# Patient Record
Sex: Female | Born: 1980 | Race: White | Hispanic: No | Marital: Single | Smoking: Former smoker
Health system: Southern US, Community
[De-identification: ages and names within clinical notes are randomized; demographics above are authoritative.]

## PROBLEM LIST (undated history)

## (undated) ENCOUNTER — Emergency Department (HOSPITAL_COMMUNITY): Admission: EM | Payer: Medicare Other | Source: Home / Self Care

## (undated) ENCOUNTER — Inpatient Hospital Stay (HOSPITAL_COMMUNITY): Payer: Self-pay

## (undated) DIAGNOSIS — G709 Myoneural disorder, unspecified: Secondary | ICD-10-CM

## (undated) DIAGNOSIS — J189 Pneumonia, unspecified organism: Secondary | ICD-10-CM

## (undated) DIAGNOSIS — R7689 Other specified abnormal immunological findings in serum: Secondary | ICD-10-CM

## (undated) DIAGNOSIS — R3915 Urgency of urination: Secondary | ICD-10-CM

## (undated) DIAGNOSIS — F410 Panic disorder [episodic paroxysmal anxiety] without agoraphobia: Secondary | ICD-10-CM

## (undated) DIAGNOSIS — G629 Polyneuropathy, unspecified: Secondary | ICD-10-CM

## (undated) DIAGNOSIS — Z973 Presence of spectacles and contact lenses: Secondary | ICD-10-CM

## (undated) DIAGNOSIS — F4310 Post-traumatic stress disorder, unspecified: Secondary | ICD-10-CM

## (undated) DIAGNOSIS — G35 Multiple sclerosis: Secondary | ICD-10-CM

## (undated) DIAGNOSIS — K644 Residual hemorrhoidal skin tags: Secondary | ICD-10-CM

## (undated) DIAGNOSIS — G8929 Other chronic pain: Secondary | ICD-10-CM

## (undated) DIAGNOSIS — K631 Perforation of intestine (nontraumatic): Secondary | ICD-10-CM

## (undated) DIAGNOSIS — K760 Fatty (change of) liver, not elsewhere classified: Secondary | ICD-10-CM

## (undated) DIAGNOSIS — Z87442 Personal history of urinary calculi: Secondary | ICD-10-CM

## (undated) DIAGNOSIS — K219 Gastro-esophageal reflux disease without esophagitis: Secondary | ICD-10-CM

## (undated) DIAGNOSIS — J302 Other seasonal allergic rhinitis: Secondary | ICD-10-CM

## (undated) DIAGNOSIS — F32A Depression, unspecified: Secondary | ICD-10-CM

## (undated) DIAGNOSIS — G43909 Migraine, unspecified, not intractable, without status migrainosus: Secondary | ICD-10-CM

## (undated) DIAGNOSIS — M1990 Unspecified osteoarthritis, unspecified site: Secondary | ICD-10-CM

## (undated) DIAGNOSIS — N83209 Unspecified ovarian cyst, unspecified side: Secondary | ICD-10-CM

## (undated) DIAGNOSIS — R42 Dizziness and giddiness: Secondary | ICD-10-CM

## (undated) DIAGNOSIS — F445 Conversion disorder with seizures or convulsions: Secondary | ICD-10-CM

## (undated) DIAGNOSIS — M5030 Other cervical disc degeneration, unspecified cervical region: Secondary | ICD-10-CM

## (undated) DIAGNOSIS — R768 Other specified abnormal immunological findings in serum: Secondary | ICD-10-CM

## (undated) DIAGNOSIS — Z98891 History of uterine scar from previous surgery: Secondary | ICD-10-CM

## (undated) DIAGNOSIS — F449 Dissociative and conversion disorder, unspecified: Secondary | ICD-10-CM

## (undated) DIAGNOSIS — Z8709 Personal history of other diseases of the respiratory system: Secondary | ICD-10-CM

## (undated) DIAGNOSIS — F329 Major depressive disorder, single episode, unspecified: Secondary | ICD-10-CM

## (undated) DIAGNOSIS — R569 Unspecified convulsions: Secondary | ICD-10-CM

## (undated) DIAGNOSIS — F419 Anxiety disorder, unspecified: Secondary | ICD-10-CM

## (undated) DIAGNOSIS — M797 Fibromyalgia: Secondary | ICD-10-CM

## (undated) DIAGNOSIS — M549 Dorsalgia, unspecified: Secondary | ICD-10-CM

## (undated) DIAGNOSIS — I10 Essential (primary) hypertension: Secondary | ICD-10-CM

## (undated) DIAGNOSIS — F909 Attention-deficit hyperactivity disorder, unspecified type: Secondary | ICD-10-CM

## (undated) DIAGNOSIS — R51 Headache: Secondary | ICD-10-CM

## (undated) HISTORY — PX: TRANSRECTAL DRAINAGE OF PELVIC ABSCESS: SUR1387

## (undated) HISTORY — PX: ABDOMINAL SURGERY: SHX537

## (undated) HISTORY — PX: EXCISIONAL HEMORRHOIDECTOMY: SHX1541

## (undated) HISTORY — PX: HERNIA REPAIR: SHX51

## (undated) HISTORY — PX: COLON SURGERY: SHX602

## (undated) HISTORY — PX: APPENDECTOMY: SHX54

## (undated) HISTORY — DX: Essential (primary) hypertension: I10

---
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
## 2001-07-07 ENCOUNTER — Other Ambulatory Visit: Admission: RE | Admit: 2001-07-07 | Discharge: 2001-07-07 | Payer: Self-pay

## 2006-06-08 DIAGNOSIS — G35 Multiple sclerosis: Secondary | ICD-10-CM

## 2006-06-08 HISTORY — DX: Multiple sclerosis: G35

## 2006-09-03 DIAGNOSIS — M792 Neuralgia and neuritis, unspecified: Secondary | ICD-10-CM | POA: Insufficient documentation

## 2006-09-03 DIAGNOSIS — N200 Calculus of kidney: Secondary | ICD-10-CM | POA: Insufficient documentation

## 2007-01-08 DIAGNOSIS — K631 Perforation of intestine (nontraumatic): Secondary | ICD-10-CM

## 2007-01-08 DIAGNOSIS — Z9049 Acquired absence of other specified parts of digestive tract: Secondary | ICD-10-CM

## 2007-01-08 HISTORY — PX: BOWEL RESECTION: SHX1257

## 2007-01-08 HISTORY — DX: Perforation of intestine (nontraumatic): K63.1

## 2007-01-08 HISTORY — DX: Acquired absence of other specified parts of digestive tract: Z90.49

## 2007-04-08 HISTORY — PX: COLOSTOMY CLOSURE: SHX1381

## 2007-12-27 ENCOUNTER — Emergency Department (HOSPITAL_COMMUNITY): Admission: EM | Admit: 2007-12-27 | Discharge: 2007-12-27 | Payer: Self-pay | Admitting: Emergency Medicine

## 2009-12-11 ENCOUNTER — Ambulatory Visit: Payer: Self-pay | Admitting: Internal Medicine

## 2010-01-10 ENCOUNTER — Ambulatory Visit: Admit: 2010-01-10 | Payer: Self-pay | Admitting: Internal Medicine

## 2010-01-10 ENCOUNTER — Ambulatory Visit (HOSPITAL_COMMUNITY): Admission: RE | Admit: 2010-01-10 | Payer: Self-pay | Source: Home / Self Care | Admitting: Internal Medicine

## 2010-02-22 ENCOUNTER — Encounter (INDEPENDENT_AMBULATORY_CARE_PROVIDER_SITE_OTHER): Payer: Self-pay | Admitting: Internal Medicine

## 2010-02-22 ENCOUNTER — Ambulatory Visit (HOSPITAL_COMMUNITY): Admission: RE | Admit: 2010-02-22 | Payer: Medicaid Other | Source: Ambulatory Visit | Admitting: Internal Medicine

## 2010-10-12 LAB — BASIC METABOLIC PANEL
BUN: 14 mg/dL (ref 6–23)
Chloride: 105 mEq/L (ref 96–112)
Creatinine, Ser: 0.67 mg/dL (ref 0.4–1.2)
Glucose, Bld: 94 mg/dL (ref 70–99)

## 2010-10-25 DIAGNOSIS — K219 Gastro-esophageal reflux disease without esophagitis: Secondary | ICD-10-CM | POA: Insufficient documentation

## 2010-10-25 DIAGNOSIS — J039 Acute tonsillitis, unspecified: Secondary | ICD-10-CM | POA: Insufficient documentation

## 2011-01-15 ENCOUNTER — Encounter (HOSPITAL_BASED_OUTPATIENT_CLINIC_OR_DEPARTMENT_OTHER): Payer: Self-pay | Admitting: *Deleted

## 2011-01-16 ENCOUNTER — Other Ambulatory Visit: Payer: Self-pay | Admitting: Specialist

## 2011-01-16 NOTE — H&P (Signed)
Emily [name] is an 31 y.o. female.   Chief Complaint: Surgery includes exploration of scar of abdomen and repair of defects HPI:   Past Medical History  Diagnosis Date  . Headache     migraines  . Multiple sclerosis   . Asthma as a child  . GERD (gastroesophageal reflux disease)   . Urinary urgency   . Anxiety   . Perforated bowel 2009    Past Surgical History  Procedure Date  . Extremity cyst excision 1994    right leg  . Bowel resection 01/2007    with colostomy  . Colostomy closure 04/2007    No family history on file. Social History:  reports that she has quit smoking. She has never used smokeless tobacco. She reports that she does not drink alcohol or use illicit drugs.  Allergies:  Allergies  Allergen Reactions  . Amitriptyline Hypertension  . Baclofen Hives and Shortness Of Breath  . Cymbalta (Duloxetine Hcl) Shortness Of Breath and Rash  . Gabapentin Shortness Of Breath and Rash  . Other Shortness Of Breath and Rash    MSG  . Xanax Other (See Comments)    Lethargy  . Adhesive (Tape) Other (See Comments)    Skin irritation    Medications Prior to Admission  Medication Sig Dispense Refill  . carbamazepine (TEGRETOL) 200 MG tablet Take 300 mg by mouth 3 (three) times daily.        . carisoprodol (SOMA) 350 MG tablet Take 350 mg by mouth 3 (three) times daily as needed.        . diphenhydrAMINE (SOMINEX) 25 MG tablet Take 25 mg by mouth at bedtime as needed. 50-75 mg HS       . EPINEPHrine (EPIPEN JR) 0.15 MG/0.3ML injection Inject 0.15 mg into the muscle as needed.        . loratadine (CLARITIN) 10 MG tablet Take 10 mg by mouth daily.        . metoprolol (TOPROL-XL) 50 MG 24 hr tablet Take 50 mg by mouth daily. PM       . omeprazole (PRILOSEC) 40 MG capsule Take 40 mg by mouth 2 (two) times daily.        . ondansetron (ZOFRAN) 4 MG tablet Take 4 mg by mouth every 8 (eight) hours as needed.        Marland Kitchen oxyCODONE (ROXICODONE) 15 MG immediate release tablet Take  15 mg by mouth every 4 (four) hours as needed.        Marland Kitchen PARoxetine (PAXIL) 40 MG tablet Take 40 mg by mouth every morning.        . pregabalin (LYRICA) 200 MG capsule Take 200 mg by mouth 3 (three) times daily.        . ranitidine (ZANTAC) 300 MG capsule Take 300 mg by mouth every evening.        . solifenacin (VESICARE) 5 MG tablet Take 10 mg by mouth daily. AM       . topiramate (TOPAMAX) 100 MG tablet Take 100 mg by mouth 2 (two) times daily. 2 AM, 1 PM        No current facility-administered medications on file as of 01/16/2011.    No results found for this or any previous visit (from the past 48 hour(s)). No results found.  ROS  There were no vitals taken for this visit. Physical Exam   Assessment/Plan   Emily [name] 01/16/2011, 1:24 PM

## 2011-01-17 NOTE — Pre-Procedure Instructions (Signed)
To come for BMET and CBC, diff - boyfriend notified; will come 01/17/2010

## 2011-01-18 ENCOUNTER — Encounter (HOSPITAL_BASED_OUTPATIENT_CLINIC_OR_DEPARTMENT_OTHER)
Admission: RE | Admit: 2011-01-18 | Discharge: 2011-01-18 | Disposition: A | Discharge status: Home / Self Care | Payer: Medicare Other | Source: Ambulatory Visit | Attending: Specialist | Admitting: Specialist

## 2011-01-18 LAB — CBC
Hemoglobin: 14.2 g/dL (ref 12.0–15.0)
Platelets: 269 10*3/uL (ref 150–400)
RBC: 4.57 MIL/uL (ref 3.87–5.11)
WBC: 12 10*3/uL — ABNORMAL HIGH (ref 4.0–10.5)

## 2011-01-21 ENCOUNTER — Encounter (HOSPITAL_BASED_OUTPATIENT_CLINIC_OR_DEPARTMENT_OTHER): Payer: Self-pay | Admitting: Anesthesiology

## 2011-01-21 ENCOUNTER — Encounter (HOSPITAL_BASED_OUTPATIENT_CLINIC_OR_DEPARTMENT_OTHER): Payer: Self-pay

## 2011-01-21 ENCOUNTER — Encounter (HOSPITAL_BASED_OUTPATIENT_CLINIC_OR_DEPARTMENT_OTHER): Payer: Self-pay | Admitting: Specialist

## 2011-01-21 ENCOUNTER — Ambulatory Visit (HOSPITAL_BASED_OUTPATIENT_CLINIC_OR_DEPARTMENT_OTHER)
Admission: RE | Admit: 2011-01-21 | Discharge: 2011-01-21 | Disposition: A | Discharge status: Home / Self Care | Payer: Medicare Other | Source: Ambulatory Visit | Attending: Specialist | Admitting: Specialist

## 2011-01-21 ENCOUNTER — Encounter (HOSPITAL_BASED_OUTPATIENT_CLINIC_OR_DEPARTMENT_OTHER)
Admission: RE | Disposition: A | Discharge status: Home / Self Care | Payer: Self-pay | Source: Ambulatory Visit | Attending: Specialist

## 2011-01-21 ENCOUNTER — Other Ambulatory Visit: Payer: Self-pay | Admitting: Specialist

## 2011-01-21 ENCOUNTER — Ambulatory Visit (HOSPITAL_BASED_OUTPATIENT_CLINIC_OR_DEPARTMENT_OTHER): Payer: Medicare Other | Admitting: Anesthesiology

## 2011-01-21 DIAGNOSIS — Y834 Other reconstructive surgery as the cause of abnormal reaction of the patient, or of later complication, without mention of misadventure at the time of the procedure: Secondary | ICD-10-CM | POA: Insufficient documentation

## 2011-01-21 DIAGNOSIS — Z01812 Encounter for preprocedural laboratory examination: Secondary | ICD-10-CM | POA: Insufficient documentation

## 2011-01-21 DIAGNOSIS — K439 Ventral hernia without obstruction or gangrene: Secondary | ICD-10-CM | POA: Insufficient documentation

## 2011-01-21 DIAGNOSIS — L905 Scar conditions and fibrosis of skin: Secondary | ICD-10-CM | POA: Insufficient documentation

## 2011-01-21 DIAGNOSIS — T8189XA Other complications of procedures, not elsewhere classified, initial encounter: Secondary | ICD-10-CM | POA: Insufficient documentation

## 2011-01-21 HISTORY — DX: Multiple sclerosis: G35

## 2011-01-21 HISTORY — PX: SCAR REVISION: SHX5285

## 2011-01-21 HISTORY — DX: Anxiety disorder, unspecified: F41.9

## 2011-01-21 HISTORY — DX: Headache: R51

## 2011-01-21 HISTORY — DX: Gastro-esophageal reflux disease without esophagitis: K21.9

## 2011-01-21 HISTORY — DX: Urgency of urination: R39.15

## 2011-01-21 HISTORY — DX: Perforation of intestine (nontraumatic): K63.1

## 2011-01-21 LAB — POCT I-STAT, CHEM 8
Calcium, Ion: 0.85 mmol/L — ABNORMAL LOW (ref 1.12–1.32)
Chloride: 117 mEq/L — ABNORMAL HIGH (ref 96–112)
Creatinine, Ser: 0.6 mg/dL (ref 0.50–1.10)
Glucose, Bld: 82 mg/dL (ref 70–99)
HCT: 37 % (ref 36.0–46.0)
Potassium: 5.3 mEq/L — ABNORMAL HIGH (ref 3.5–5.1)

## 2011-01-21 SURGERY — REVISION, SCAR
Anesthesia: General | Site: Abdomen | Wound class: Clean

## 2011-01-21 MED ORDER — ROCURONIUM BROMIDE 100 MG/10ML IV SOLN
INTRAVENOUS | Status: DC | PRN
  Administered 2011-01-21: 30 mg via INTRAVENOUS

## 2011-01-21 MED ORDER — CEFAZOLIN SODIUM 1-5 GM-% IV SOLN
1.0000 g | INTRAVENOUS | Status: AC
  Administered 2011-01-21: 2 g via INTRAVENOUS

## 2011-01-21 MED ORDER — DROPERIDOL 2.5 MG/ML IJ SOLN
INTRAMUSCULAR | Status: DC | PRN
  Administered 2011-01-21: 0.625 mg via INTRAVENOUS

## 2011-01-21 MED ORDER — SODIUM CHLORIDE 0.9 % IV SOLN
INTRAVENOUS | Status: DC | PRN
  Administered 2011-01-21: 100 mL via INTRAMUSCULAR

## 2011-01-21 MED ORDER — LIDOCAINE-EPINEPHRINE 0.5-1:200000 % IJ SOLN
INTRAMUSCULAR | Status: DC | PRN
  Administered 2011-01-21: 100 mL

## 2011-01-21 MED ORDER — LIDOCAINE HCL (CARDIAC) 20 MG/ML IV SOLN
INTRAVENOUS | Status: DC | PRN
  Administered 2011-01-21: 60 mg via INTRAVENOUS

## 2011-01-21 MED ORDER — NEOSTIGMINE METHYLSULFATE 1 MG/ML IJ SOLN
INTRAMUSCULAR | Status: DC | PRN
  Administered 2011-01-21: 3 mg via INTRAVENOUS

## 2011-01-21 MED ORDER — MIDAZOLAM HCL 5 MG/5ML IJ SOLN
INTRAMUSCULAR | Status: DC | PRN
  Administered 2011-01-21: 2 mg via INTRAVENOUS

## 2011-01-21 MED ORDER — DEXAMETHASONE SODIUM PHOSPHATE 4 MG/ML IJ SOLN
INTRAMUSCULAR | Status: DC | PRN
  Administered 2011-01-21: 10 mg via INTRAVENOUS

## 2011-01-21 MED ORDER — LACTATED RINGERS IV SOLN
INTRAVENOUS | Status: DC
  Administered 2011-01-21: 07:00:00 via INTRAVENOUS

## 2011-01-21 MED ORDER — METHYLENE BLUE 1 % INJ SOLN
INTRAMUSCULAR | Status: DC | PRN
  Administered 2011-01-21: .5 mL via INTRADERMAL

## 2011-01-21 MED ORDER — FENTANYL CITRATE 0.05 MG/ML IJ SOLN
INTRAMUSCULAR | Status: DC | PRN
  Administered 2011-01-21: 100 ug via INTRAVENOUS

## 2011-01-21 MED ORDER — PROPOFOL 10 MG/ML IV EMUL
INTRAVENOUS | Status: DC | PRN
  Administered 2011-01-21: 300 mg via INTRAVENOUS

## 2011-01-21 MED ORDER — GLYCOPYRROLATE 0.2 MG/ML IJ SOLN
INTRAMUSCULAR | Status: DC | PRN
  Administered 2011-01-21: .4 mg via INTRAVENOUS

## 2011-01-21 MED ORDER — ONDANSETRON HCL 4 MG/2ML IJ SOLN
INTRAMUSCULAR | Status: DC | PRN
  Administered 2011-01-21: 4 mg via INTRAVENOUS

## 2011-01-21 SURGICAL SUPPLY — 72 items
APL SKNCLS STERI-STRIP NONHPOA (GAUZE/BANDAGES/DRESSINGS) ×1
BAG DECANTER FOR FLEXI CONT (MISCELLANEOUS) IMPLANT
BANDAGE ELASTIC 4 VELCRO ST LF (GAUZE/BANDAGES/DRESSINGS) IMPLANT
BANDAGE GAUZE ELAST BULKY 4 IN (GAUZE/BANDAGES/DRESSINGS) IMPLANT
BENZOIN TINCTURE PRP APPL 2/3 (GAUZE/BANDAGES/DRESSINGS) ×1 IMPLANT
BLADE KNIFE PERSONA 15 (BLADE) ×2 IMPLANT
BNDG COHESIVE 4X5 TAN STRL (GAUZE/BANDAGES/DRESSINGS) IMPLANT
CANISTER SUCTION 1200CC (MISCELLANEOUS) IMPLANT
CLOTH BEACON ORANGE TIMEOUT ST (SAFETY) ×2 IMPLANT
COVER MAYO STAND STRL (DRAPES) ×2 IMPLANT
COVER TABLE BACK 60X90 (DRAPES) ×2 IMPLANT
DECANTER SPIKE VIAL GLASS SM (MISCELLANEOUS) IMPLANT
DRAIN CHANNEL 7F FF FLAT (WOUND CARE) ×1 IMPLANT
DRAPE LAPAROTOMY TRNSV 102X78 (DRAPE) ×1 IMPLANT
DRAPE PED LAPAROTOMY (DRAPES) IMPLANT
DRAPE U-SHAPE 76X120 STRL (DRAPES) IMPLANT
DRSG PAD ABDOMINAL 8X10 ST (GAUZE/BANDAGES/DRESSINGS) IMPLANT
ELECT NDL TIP 2.8 STRL (NEEDLE) IMPLANT
ELECT NEEDLE TIP 2.8 STRL (NEEDLE) IMPLANT
ELECT REM PT RETURN 9FT ADLT (ELECTROSURGICAL) ×2
ELECTRODE REM PT RTRN 9FT ADLT (ELECTROSURGICAL) ×1 IMPLANT
EVACUATOR SILICONE 100CC (DRAIN) ×1 IMPLANT
FILTER 7/8 IN (FILTER) IMPLANT
GAUZE SPONGE 4X4 12PLY STRL LF (GAUZE/BANDAGES/DRESSINGS) ×1 IMPLANT
GAUZE SPONGE 4X4 16PLY XRAY LF (GAUZE/BANDAGES/DRESSINGS) IMPLANT
GAUZE XEROFORM 1X8 LF (GAUZE/BANDAGES/DRESSINGS) IMPLANT
GAUZE XEROFORM 5X9 LF (GAUZE/BANDAGES/DRESSINGS) ×1 IMPLANT
GLOVE BIO SURGEON STRL SZ 6.5 (GLOVE) ×2 IMPLANT
GLOVE BIOGEL M STRL SZ7.5 (GLOVE) IMPLANT
GLOVE BIOGEL PI IND STRL 8 (GLOVE) IMPLANT
GLOVE BIOGEL PI INDICATOR 8 (GLOVE)
GLOVE ECLIPSE 7.0 STRL STRAW (GLOVE) ×2 IMPLANT
GOWN PREVENTION PLUS XLARGE (GOWN DISPOSABLE) IMPLANT
GOWN PREVENTION PLUS XXLARGE (GOWN DISPOSABLE) ×3 IMPLANT
IV NS 500ML (IV SOLUTION) ×2
IV NS 500ML BAXH (IV SOLUTION) ×1 IMPLANT
NDL HYPO 25X1 1.5 SAFETY (NEEDLE) IMPLANT
NDL SPNL 18GX3.5 QUINCKE PK (NEEDLE) IMPLANT
NEEDLE HYPO 25X1 1.5 SAFETY (NEEDLE) IMPLANT
NEEDLE SPNL 18GX3.5 QUINCKE PK (NEEDLE) IMPLANT
PACK BASIN DAY SURGERY FS (CUSTOM PROCEDURE TRAY) ×2 IMPLANT
PAD ABD 7.5X8 STRL (GAUZE/BANDAGES/DRESSINGS) ×1 IMPLANT
SHEET MEDIUM DRAPE 40X70 STRL (DRAPES) IMPLANT
SHEETING SILICONE GEL EPI DERM (MISCELLANEOUS) IMPLANT
SPONGE GAUZE 4X4 12PLY (GAUZE/BANDAGES/DRESSINGS) IMPLANT
SPONGE LAP 18X18 X RAY DECT (DISPOSABLE) ×2 IMPLANT
STOCKINETTE IMPERVIOUS LG (DRAPES) IMPLANT
STRIP SUTURE WOUND CLOSURE 1/2 (SUTURE) ×1 IMPLANT
SUT FIBERWIRE 3-0 18 DIAM 3/8 (SUTURE)
SUT MNCRL AB 3-0 PS2 18 (SUTURE) ×1 IMPLANT
SUT MON AB 2-0 CT1 36 (SUTURE) ×1 IMPLANT
SUT MON AB 4-0 PC3 18 (SUTURE) IMPLANT
SUT MON AB 5-0 PS2 18 (SUTURE) IMPLANT
SUT PDS AB 0 CT 36 (SUTURE) ×1 IMPLANT
SUT PROLENE 0 CT 1 30 (SUTURE) ×1 IMPLANT
SUT PROLENE 4 0 PS 2 18 (SUTURE) ×1 IMPLANT
SUT PROLENE 5 0 P 3 (SUTURE) IMPLANT
SUT PROLENE 5 0 PS 2 (SUTURE) IMPLANT
SUT PROLENE 6 0 P 1 18 (SUTURE) IMPLANT
SUT STRIPS 1/4 X 4 INCH (SUTURE) IMPLANT
SUTURE FIBERWR 3-0 18 DIAM 3/8 (SUTURE) IMPLANT
SYR 20CC LL (SYRINGE) IMPLANT
SYR 50ML LL SCALE MARK (SYRINGE) ×2 IMPLANT
SYR BULB IRRIGATION 50ML (SYRINGE) IMPLANT
SYR CONTROL 10ML LL (SYRINGE) ×1 IMPLANT
TAPE HYPAFIX 6X30 (GAUZE/BANDAGES/DRESSINGS) ×1 IMPLANT
TAPE PAPER MEDFIX 1IN X 10YD (GAUZE/BANDAGES/DRESSINGS) IMPLANT
TOWEL OR 17X24 6PK STRL BLUE (TOWEL DISPOSABLE) ×5 IMPLANT
TUBE CONNECTING 20X1/4 (TUBING) IMPLANT
UNDERPAD 30X30 INCONTINENT (UNDERPADS AND DIAPERS) ×3 IMPLANT
VAC PENCILS W/TUBING CLEAR (MISCELLANEOUS) ×2 IMPLANT
WATER STERILE IRR 1000ML POUR (IV SOLUTION) ×2 IMPLANT

## 2011-01-21 NOTE — Anesthesia Procedure Notes (Signed)
Procedure Name: Intubation Performed by: Sharyne Richters Pre-anesthesia Checklist: Patient identified, Timeout performed, Emergency Drugs available, Suction available and Patient being monitored Patient Re-evaluated:Patient Re-evaluated prior to inductionOxygen Delivery Method: Circle System Utilized Preoxygenation: Pre-oxygenation with 100% oxygen Intubation Type: IV induction Ventilation: Mask ventilation without difficulty Laryngoscope Size: Miller and 2 Grade View: Grade I Tube type: Oral Tube size: 7.0 mm Number of attempts: 1 Placement Confirmation: ETT inserted through vocal cords under direct vision,  positive ETCO2 and breath sounds checked- equal and bilateral Secured at: 21 cm Tube secured with: Tape Dental Injury: Teeth and Oropharynx as per pre-operative assessment

## 2011-01-21 NOTE — Anesthesia Postprocedure Evaluation (Signed)
Anesthesia Post Note  Patient: Emily [name]  Procedure(s) Performed:  SCAR REVISION - exploration of scar of abdomen and repair of defect  Anesthesia type: General  Patient location: PACU  Post pain: Pain level controlled  Post assessment: Patient's Cardiovascular Status Stable  Last Vitals:  Filed Vitals:   01/21/11 1032  BP: 124/71  Pulse: 77  Temp: 36.9 C  Resp: 16    Post vital signs: Reviewed and stable  Level of consciousness: alert  Complications: No apparent anesthesia complications

## 2011-01-21 NOTE — Transfer of Care (Signed)
Immediate Anesthesia Transfer of Care Note  Patient: Emily [name]  Procedure(s) Performed:  SCAR REVISION - exploration of scar of abdomen and repair of defect  Patient Location: PACU  Anesthesia Type: General  Level of Consciousness: awake, alert  and oriented  Airway & Oxygen Therapy: Patient Spontanous Breathing and Patient connected to face mask oxygen  Post-op Assessment: Report given to PACU RN and Post -op Vital signs reviewed and stable  Post vital signs: Reviewed and stable Filed Vitals:   01/21/11 1000  BP:   Pulse:   Temp: 37.2 C  Resp:     Complications: No apparent anesthesia complications

## 2011-01-21 NOTE — Anesthesia Preprocedure Evaluation (Signed)
Anesthesia Evaluation  Patient identified by MRN, date of birth, ID band Patient awake    Reviewed: Allergy & Precautions, H&P , NPO status , Patient's Chart, lab work & pertinent test results, reviewed documented beta blocker date and time   Airway Mallampati: II TM Distance: >3 FB Neck ROM: full    Dental   Pulmonary asthma ,          Cardiovascular neg cardio ROS     Neuro/Psych  Headaches,  Neuromuscular disease Negative Psych ROS   GI/Hepatic Neg liver ROS, GERD-  Medicated and Controlled,  Endo/Other  Negative Endocrine ROSMorbid obesity  Renal/GU negative Renal ROS  Genitourinary negative   Musculoskeletal   Abdominal   Peds  Hematology negative hematology ROS (+)   Anesthesia Other Findings See surgeon's H&P   Reproductive/Obstetrics negative OB ROS                           Anesthesia Physical Anesthesia Plan  ASA: III  Anesthesia Plan: General   Post-op Pain Management:    Induction: Intravenous  Airway Management Planned: LMA  Additional Equipment:   Intra-op Plan:   Post-operative Plan: Extubation in OR  Informed Consent: I have reviewed the patients History and Physical, chart, labs and discussed the procedure including the risks, benefits and alternatives for the proposed anesthesia with the patient or authorized representative who has indicated his/her understanding and acceptance.     Plan Discussed with: CRNA and Surgeon  Anesthesia Plan Comments:         Anesthesia Quick Evaluation

## 2011-01-21 NOTE — H&P (Signed)
Emily [name] is an 31 y.o. female.   Chief Complaint: abdomen, wound exploration and repair of defect HPI:   Past Medical History  Diagnosis Date  . Headache     migraines  . Multiple sclerosis   . Asthma as a child  . GERD (gastroesophageal reflux disease)   . Urinary urgency   . Anxiety   . Perforated bowel 2009    Past Surgical History  Procedure Date  . Extremity cyst excision 1994    right leg  . Bowel resection 01/2007    with colostomy  . Colostomy closure 04/2007    No family history on file. Social History:  reports that she has quit smoking. She has never used smokeless tobacco. She reports that she does not drink alcohol or use illicit drugs.  Allergies:  Allergies  Allergen Reactions  . Amitriptyline Hypertension  . Baclofen Hives and Shortness Of Breath  . Cymbalta (Duloxetine Hcl) Shortness Of Breath and Rash  . Gabapentin Shortness Of Breath and Rash  . Other Shortness Of Breath and Rash    MSG  . Xanax Other (See Comments)    Lethargy  . Vicodin (Hydrocodone-Acetaminophen) Nausea And Vomiting  . Adhesive (Tape) Other (See Comments)    Skin irritation    Medications Prior to Admission  Medication Dose Route Frequency Provider Last Rate Last Dose  . ceFAZolin (ANCEF) IVPB 1 g/50 mL premix  1 g Intravenous 60 min Pre-Op Yaakov Guthrie Keyonta Barradas      . lactated ringers infusion   Intravenous Continuous Kerby Nora, MD 20 mL/hr at 01/21/11 0706     Medications Prior to Admission  Medication Sig Dispense Refill  . carbamazepine (TEGRETOL) 200 MG tablet Take 300 mg by mouth 3 (three) times daily.        . carisoprodol (SOMA) 350 MG tablet Take 350 mg by mouth 3 (three) times daily as needed.        . diphenhydrAMINE (SOMINEX) 25 MG tablet Take 25 mg by mouth at bedtime as needed. 50-75 mg HS       . EPINEPHrine (EPIPEN JR) 0.15 MG/0.3ML injection Inject 0.15 mg into the muscle as needed.        . loratadine (CLARITIN) 10 MG tablet Take 10 mg by mouth  daily.        . metoprolol (TOPROL-XL) 50 MG 24 hr tablet Take 50 mg by mouth daily. PM       . omeprazole (PRILOSEC) 40 MG capsule Take 40 mg by mouth 2 (two) times daily.        . ondansetron (ZOFRAN) 4 MG tablet Take 4 mg by mouth every 8 (eight) hours as needed.        Marland Kitchen oxyCODONE (ROXICODONE) 15 MG immediate release tablet Take 15 mg by mouth every 4 (four) hours as needed.        Marland Kitchen PARoxetine (PAXIL) 40 MG tablet Take 40 mg by mouth every morning.        . pregabalin (LYRICA) 200 MG capsule Take 200 mg by mouth 3 (three) times daily.        . ranitidine (ZANTAC) 300 MG capsule Take 300 mg by mouth every evening.        . solifenacin (VESICARE) 5 MG tablet Take 10 mg by mouth daily. AM       . topiramate (TOPAMAX) 100 MG tablet Take 100 mg by mouth 2 (two) times daily. 2 AM, 1 PM  Results for orders placed during the hospital encounter of 01/21/11 (from the past 48 hour(s))  POCT I-STAT, CHEM 8     Status: Abnormal   Collection Time   01/21/11  7:22 AM      Component Value Range Comment   Sodium 139  135 - 145 (mEq/L)    Potassium 5.3 (*) 3.5 - 5.1 (mEq/L)    Chloride 117 (*) 96 - 112 (mEq/L)    BUN 8  6 - 23 (mg/dL)    Creatinine, Ser 1.61  0.50 - 1.10 (mg/dL)    Glucose, Bld 82  70 - 99 (mg/dL)    Calcium, Ion 0.96 (*) 1.12 - 1.32 (mmol/L)    TCO2 18  0 - 100 (mmol/L)    Hemoglobin 12.6  12.0 - 15.0 (g/dL)    HCT 04.5  40.9 - 81.1 (%)    No results found.  ROS  There were no vitals taken for this visit. Physical Exam   Assessment/Plan wound exploration and repair of defect   Cheikh Bramble L 01/21/2011, 9:01 AM

## 2011-01-21 NOTE — Brief Op Note (Signed)
01/21/2011  9:50 AM  PATIENT:  Emily [name]  31 y.o. female  PRE-OPERATIVE DIAGNOSIS:  open defect of abdomen  POST-OPERATIVE DIAGNOSIS:  open defect of abdomen  PROCEDURE:  Procedure(s): SCAR REVISIONand  Repair of defect  SURGEON:  Surgeon(s): [employer]  PHYSICIAN ASSISTANT: none  ASSISTANTS: none   ANESTHESIA:   general  EBL:  50cc  BLOOD ADMINISTERED:none  DRAINS: () Jackson-Pratt drain(s) with closed bulb suction in the loewr abdomen   LOCAL MEDICATIONS USED:  XYLOCAINE **59*CC  SPECIMEN:  Excision  DISPOSITION OF SPECIMEN:  PATHOLOGY  COUNTS:  YES  TOURNIQUET:  DICTATION: .Other Dictation: Dictation Number [id number]  PLAN OF CARE: Discharge to home after PACU  PATIENT DISPOSITION:  PACU - hemodynamically stable.   Delay start of Pharmacological VTE agent (>24hrs) due to surgical blood loss or risk of bleeding:  {YES/NO/NOT APPLICABLE:20182

## 2011-01-22 ENCOUNTER — Encounter (HOSPITAL_BASED_OUTPATIENT_CLINIC_OR_DEPARTMENT_OTHER): Payer: Self-pay | Admitting: Specialist

## 2011-01-23 NOTE — Op Note (Signed)
NAME:  Emily [name], Emily [name]               ACCOUNT NO.:  [id number]  MEDICAL RECORD NO.:  [id number]  LOCATION:                                 FACILITY:  PHYSICIAN:  Frankie Zito L. Shon Hough, M.D.   DATE OF BIRTH:  DATE OF PROCEDURE: DATE OF DISCHARGE:                              OPERATIVE REPORT   A 31 year old lady who on January 21, 2011, has been explored her right midline belly because of the increased drainage off and on of a nonhealing site.  The patient is status post emergency bowel surgery in 2009 and then subsequently had a colostomy, I re-closed approximately 6 months later.  Since that period of time, the patient has had intermittent drainage from an area involving her right midline area just right of the belly button with increased pain and discomfort related to that.  Today we are going to explore her and repair any subsequent structures as necessary, including any hernias or weaknesses.  ANESTHESIA:  General.  PROCEDURE:  Exploration of the abdomen, repair of large ventral hernia defect, removal of foreign body granulomatous mass.  ANESTHESIA:  General.  The patient was taken to the operating room, placed on the operating room table in the supine position, was given adequate general anesthesia, intubated orally.  Prep was done to the abdominal area using Hibiclens soap and solution, walled off with sterile towels and drapes so as to make a sterile field.  Methylene blue was injected in the midportion of the drain site.  Xylocaine 0.5% with epinephrine injected locally around the area.  Total of 50 mL.  Next, we entered the area by going in aversion anterior to  above the midline, around it, and also midline below the belly button.  I removed this piece in one total portion in delicate with Metzenbaum scissors and being careful not to enter any stuck bowel or omentum.  There was none and I was able to get under the defect in its entirety including all methylene blue  stains within the specimen.  Irrigation was done.  Hemostasis was maintained with the Bovie on coagulation.  Next, using the Metzenbaum scissors, I was able to dissect the fat away from the underlying fascia planes, identified large defect in the area, measured approximately size of a large orange.  I was able to repair this bag with a running 0 interlocking Prolene suture, and then topped it with 0 PDS for reinforcement.  Irrigation was done.  The wounds were then closed in layers, 3-0 Monocryl x2 layers and a running subcuticular stitch of 3-0 Monocryl.  The defect was drained with a #7 Blake drain which was placed in the depths of wound and brought out through the lateral-most portion of the incision, secured with 3-0 Prolene.  The wounds were cleansed.  Sterile dressings applied and Hypafix tape.  She withstood the procedure very well and was taken to the recovery in excellent condition.     Yaakov Guthrie. Shon Hough, M.D.     Cathie Hoops  D:  01/21/2011  T:  01/22/2011  Job:  [id number]

## 2011-01-25 ENCOUNTER — Encounter (HOSPITAL_BASED_OUTPATIENT_CLINIC_OR_DEPARTMENT_OTHER): Payer: Self-pay

## 2011-08-02 DIAGNOSIS — Z0000 Encounter for general adult medical examination without abnormal findings: Secondary | ICD-10-CM

## 2012-08-31 DIAGNOSIS — R209 Unspecified disturbances of skin sensation: Secondary | ICD-10-CM

## 2013-03-08 ENCOUNTER — Encounter (HOSPITAL_COMMUNITY): Payer: Self-pay | Admitting: Emergency Medicine

## 2013-03-08 ENCOUNTER — Emergency Department (HOSPITAL_COMMUNITY)
Admission: EM | Admit: 2013-03-08 | Discharge: 2013-03-08 | Disposition: A | Discharge status: Home / Self Care | Payer: Medicare Other | Attending: Emergency Medicine | Admitting: Emergency Medicine

## 2013-03-08 DIAGNOSIS — J45909 Unspecified asthma, uncomplicated: Secondary | ICD-10-CM | POA: Insufficient documentation

## 2013-03-08 DIAGNOSIS — Z3202 Encounter for pregnancy test, result negative: Secondary | ICD-10-CM | POA: Insufficient documentation

## 2013-03-08 DIAGNOSIS — F411 Generalized anxiety disorder: Secondary | ICD-10-CM | POA: Insufficient documentation

## 2013-03-08 DIAGNOSIS — Z79899 Other long term (current) drug therapy: Secondary | ICD-10-CM | POA: Insufficient documentation

## 2013-03-08 DIAGNOSIS — G35 Multiple sclerosis: Secondary | ICD-10-CM

## 2013-03-08 DIAGNOSIS — R42 Dizziness and giddiness: Secondary | ICD-10-CM | POA: Insufficient documentation

## 2013-03-08 DIAGNOSIS — Z87891 Personal history of nicotine dependence: Secondary | ICD-10-CM | POA: Insufficient documentation

## 2013-03-08 DIAGNOSIS — G43909 Migraine, unspecified, not intractable, without status migrainosus: Secondary | ICD-10-CM | POA: Insufficient documentation

## 2013-03-08 DIAGNOSIS — K219 Gastro-esophageal reflux disease without esophagitis: Secondary | ICD-10-CM | POA: Insufficient documentation

## 2013-03-08 DIAGNOSIS — R52 Pain, unspecified: Secondary | ICD-10-CM

## 2013-03-08 LAB — URINALYSIS, ROUTINE W REFLEX MICROSCOPIC
BILIRUBIN URINE: NEGATIVE
Glucose, UA: NEGATIVE mg/dL
Hgb urine dipstick: NEGATIVE
Ketones, ur: NEGATIVE mg/dL
LEUKOCYTES UA: NEGATIVE
NITRITE: NEGATIVE
PH: 6 (ref 5.0–8.0)
Protein, ur: NEGATIVE mg/dL
SPECIFIC GRAVITY, URINE: 1.02 (ref 1.005–1.030)
Urobilinogen, UA: 0.2 mg/dL (ref 0.0–1.0)

## 2013-03-08 LAB — COMPREHENSIVE METABOLIC PANEL
ALT: 8 U/L (ref 0–35)
AST: 12 U/L (ref 0–37)
Albumin: 3.4 g/dL — ABNORMAL LOW (ref 3.5–5.2)
Alkaline Phosphatase: 69 U/L (ref 39–117)
BUN: 10 mg/dL (ref 6–23)
CALCIUM: 8.9 mg/dL (ref 8.4–10.5)
CO2: 30 mEq/L (ref 19–32)
Chloride: 103 mEq/L (ref 96–112)
Creatinine, Ser: 0.78 mg/dL (ref 0.50–1.10)
GFR calc non Af Amer: >90 mL/min (ref >90)
GLUCOSE: 84 mg/dL (ref 70–99)
Potassium: 4 mEq/L (ref 3.7–5.3)
SODIUM: 142 meq/L (ref 137–147)
Total Bilirubin: 0.3 mg/dL (ref 0.3–1.2)
Total Protein: 6.8 g/dL (ref 6.0–8.3)

## 2013-03-08 LAB — CBC WITH DIFFERENTIAL/PLATELET
BASOS ABS: 0 10*3/uL (ref 0.0–0.1)
Basophils Relative: 0 % (ref 0–1)
EOS PCT: 2 % (ref 0–5)
Eosinophils Absolute: 0.1 10*3/uL (ref 0.0–0.7)
HCT: 40.1 % (ref 36.0–46.0)
Hemoglobin: 13.7 g/dL (ref 12.0–15.0)
LYMPHS ABS: 1.7 10*3/uL (ref 0.7–4.0)
Lymphocytes Relative: 27 % (ref 12–46)
MCH: 31.1 pg (ref 26.0–34.0)
MCHC: 34.2 g/dL (ref 30.0–36.0)
MCV: 91.1 fL (ref 78.0–100.0)
Monocytes Absolute: 0.3 10*3/uL (ref 0.1–1.0)
Monocytes Relative: 5 % (ref 3–12)
Neutro Abs: 4.1 10*3/uL (ref 1.7–7.7)
Neutrophils Relative %: 66 % (ref 43–77)
PLATELETS: 300 10*3/uL (ref 150–400)
RBC: 4.4 MIL/uL (ref 3.87–5.11)
RDW: 13.7 % (ref 11.5–15.5)
WBC: 6.2 10*3/uL (ref 4.0–10.5)

## 2013-03-08 LAB — PREGNANCY, URINE: PREG TEST UR: NEGATIVE

## 2013-03-08 MED ORDER — PROMETHAZINE HCL 25 MG/ML IJ SOLN
25.0000 mg | Freq: Once | INTRAMUSCULAR | Status: AC
  Administered 2013-03-08: 25 mg via INTRAVENOUS
  Filled 2013-03-08: qty 1

## 2013-03-08 MED ORDER — MORPHINE SULFATE 4 MG/ML IJ SOLN
4.0000 mg | Freq: Once | INTRAMUSCULAR | Status: AC
  Administered 2013-03-08: 4 mg via INTRAVENOUS
  Filled 2013-03-08: qty 1

## 2013-03-08 MED ORDER — SODIUM CHLORIDE 0.9 % IV BOLUS (SEPSIS)
1000.0000 mL | Freq: Once | INTRAVENOUS | Status: AC
  Administered 2013-03-08: 1000 mL via INTRAVENOUS

## 2013-03-08 NOTE — ED Notes (Signed)
MS exacerbation for 4 days. Seen at [facility] yesterday and given morphine.  Today says unable to see her MD and told that "Ya'll know about MS".  Pain in legs, skin hurts.   Nausea, no vomiting.  Headache.  dizzy  "

## 2013-03-08 NOTE — Discharge Instructions (Signed)
I spoke with Dr. Doy Hutching who does not recommend steroids at this time.  They can see you in the office this Friday.  Please call for a time.  Your labs, urine were normal today.  Please return to emergency Department if you have changes in your vision, numbness or weakness in one side of your body.   Multiple Sclerosis Multiple sclerosis (MS) is a disease of the central nervous system. It leads to loss of the insulating covering of the nerves (myelin sheath) of your brain. When this happens, brain signals do not get transmitted properly or may not get transmitted at all. The symptoms of MS occur in episodes or attacks. These attacks may last weeks to months. There may be long periods of nearly no problems between attacks. The age of onset of MS varies.  CAUSES The cause of MS is unknown. However, it is more common in the [place] than in the [place]. RISK FACTORS There is a higher incidence of MS in women than in men. MS is not an inherited illness, although your risk of MS is higher if you have a relative with MS. SIGNS AND SYMPTOMS  The symptoms of MS occur in episodes or attacks. These attacks may last weeks to months. There may be long periods of almost no symptoms between attacks. The symptoms of MS vary. This is because of the many different ways it affects the central nervous system. The main symptoms of MS include:  Vision problems and eye pain.  Numbness.  Weakness.  Paralysis in your arms, hands, feet, and legs (extremities).  Balance problems.  Tremors. DIAGNOSIS  Your health care provider can diagnose MS with the help of imaging exams and lab tests. These may include specialized X-ray exams and spinal fluid tests. The best imaging exam to confirm a diagnosis of MS is MRI. TREATMENT  There is no known cure for MS, but there are medicines that can decrease the number and frequency of attacks. Steroids are often used for short-term relief. Physical and  occupational therapy may also help. HOME CARE INSTRUCTIONS   Take medicines as directed by your health care provider.  Exercise as directed by your health care provider. SEEK MEDICAL CARE IF: You begin to feel depressed. SEEK IMMEDIATE MEDICAL CARE IF:  You develop paralysis.  You develop problems with bladder, bowel, or sexual function.  You develop mental changes, such as forgetfulness or mood swings.  You have a seizure. Document Released: 12/22/1999 Document Revised: 10/14/2012 Document Reviewed: 08/31/2012 [facility] Patient Information 2014 [place].

## 2013-03-08 NOTE — ED Provider Notes (Signed)
TIME SEEN: 1:10 PM  CHIEF COMPLAINT: Pain all over  HPI: Patient is a 33 yo female with a history of multiple sclerosis on Gilenya was followed by Dr. Doy Hutching with [facility] in [facility] who presents to the emergency department with diffuse pain that is similar to her prior MS exacerbations for the past 4 days.  She states she feels this is triggered by stress.  She is having a burning sensation diffusely, feels dizzy and is having a typical migraine headache.  She denies any numbness, tingling or focal weakness.  She denies any recent infectious symptoms, fever, vomiting or diarrhea, cough.  She has not missed any doses of her medications.  ROS: See HPI Constitutional: no fever  Eyes: no drainage  ENT: no runny nose   Cardiovascular:  no chest pain  Resp: no SOB  GI: no vomiting GU: no dysuria Integumentary: no rash  Allergy: no hives  Musculoskeletal: no leg swelling  Neurological: no slurred speech ROS otherwise negative  PAST MEDICAL HISTORY/PAST SURGICAL HISTORY:  Past Medical History  Diagnosis Date  . Headache(784.0)     migraines  . Multiple sclerosis   . Asthma as a child  . GERD (gastroesophageal reflux disease)   . Urinary urgency   . Anxiety   . Perforated bowel 2009    MEDICATIONS:  Prior to Admission medications   Medication Sig Start Date End Date Taking? Authorizing Provider  carisoprodol (SOMA) 350 MG tablet Take 350 mg by mouth 3 (three) times daily.    Yes Historical Provider, MD  clonazePAM (KLONOPIN) 1 MG tablet Take 1 mg by mouth 4 (four) times daily as needed for anxiety.   Yes Historical Provider, MD  divalproex (DEPAKOTE ER) 500 MG 24 hr tablet Take 500 mg by mouth daily. For depression   Yes Historical Provider, MD  EPINEPHrine (EPIPEN) 0.3 mg/0.3 mL SOAJ injection Inject 0.3 mg into the muscle once.   Yes Historical Provider, MD  etodolac (LODINE) 400 MG tablet Take 400 mg by mouth 2 (two) times daily.   Yes Historical Provider, MD   Fingolimod HCl (GILENYA) 0.5 MG CAPS Take 1 capsule by mouth daily.   Yes Historical Provider, MD  hydrOXYzine (ATARAX/VISTARIL) 10 MG tablet Take 10 mg by mouth daily as needed for itching.   Yes Historical Provider, MD  levonorgestrel (MIRENA) 20 MCG/24HR IUD 1 each by Intrauterine route once.   Yes Historical Provider, MD  loratadine (CLARITIN) 10 MG tablet Take 10 mg by mouth daily.     Yes Historical Provider, MD  omeprazole (PRILOSEC) 40 MG capsule Take 40 mg by mouth daily.    Yes Historical Provider, MD  ondansetron (ZOFRAN) 4 MG tablet Take 4 mg by mouth every 8 (eight) hours as needed.     Yes Historical Provider, MD  oxyCODONE (ROXICODONE) 15 MG immediate release tablet Take 15 mg by mouth every 4 (four) hours as needed.     Yes Historical Provider, MD  potassium chloride (K-DUR) 10 MEQ tablet Take 10 mEq by mouth 2 (two) times daily.   Yes Historical Provider, MD  pregabalin (LYRICA) 200 MG capsule Take 200 mg by mouth at bedtime.    Yes Historical Provider, MD  ranitidine (ZANTAC) 300 MG capsule Take 300 mg by mouth every evening.     Yes Historical Provider, MD  SUMAtriptan (IMITREX) 6 MG/0.5ML SOLN injection Inject 6 mg into the skin every 2 (two) hours as needed for migraine or headache. May repeat in 2 hours if headache persists  or recurs.   Yes Historical Provider, MD  topiramate (TOPAMAX) 100 MG tablet Take 100 mg by mouth 2 (two) times daily.    Yes Historical Provider, MD  venlafaxine (EFFEXOR) 75 MG tablet Take 75 mg by mouth daily.   Yes Historical Provider, MD    ALLERGIES:  Allergies  Allergen Reactions  . Amitriptyline Hypertension  . Baclofen Hives and Shortness Of Breath  . Cymbalta [Duloxetine Hcl] Shortness Of Breath and Rash  . Gabapentin Shortness Of Breath and Rash  . Other Shortness Of Breath and Rash    MSG  . Alprazolam Other (See Comments)    Lethargy  . Vicodin [Hydrocodone-Acetaminophen] Nausea And Vomiting  . Adhesive [Tape] Other (See Comments)     Skin irritation    SOCIAL HISTORY:  History  Substance Use Topics  . Smoking status: Former Research scientist (life sciences)  . Smokeless tobacco: Never Used     Comment: quit smoking 10 days ago  . Alcohol Use: No    FAMILY HISTORY: History reviewed. No pertinent family history.  EXAM: BP 148/102  Pulse 93  Temp(Src) 97.5 F (36.4 C) (Oral)  Resp 18  Ht 5\' 5"  (1.651 m)  Wt 190 lb (86.183 kg)  BMI 31.62 kg/m2  SpO2 98% CONSTITUTIONAL: Alert and oriented and responds appropriately to questions. Well-appearing; well-nourished HEAD: Normocephalic EYES: Conjunctivae clear, PERRL ENT: normal nose; no rhinorrhea; moist mucous membranes; pharynx without lesions noted NECK: Supple, no meningismus, no LAD  CARD: RRR; S1 and S2 appreciated; no murmurs, no clicks, no rubs, no gallops RESP: Normal chest excursion without splinting or tachypnea; breath sounds clear and equal bilaterally; no wheezes, no rhonchi, no rales,  ABD/GI: Normal bowel sounds; non-distended; soft, non-tender, no rebound, no guarding BACK:  The back appears normal and is non-tender to palpation, there is no CVA tenderness EXT: Normal ROM in all joints; non-tender to palpation; no edema; normal capillary refill; no cyanosis    SKIN: Normal color for age and race; warm NEURO: Moves all extremities equally; strength 5/5 in all 4 extremities, Sensation to light touch intact diffusely, cranial nerves II through XII intact PSYCH: The patient's mood and manner are appropriate. Grooming and personal hygiene are appropriate.  MEDICAL DECISION MAKING: Patient here with diffuse pain that she reports is somewhat her her MS exacerbations.  She is neurologically intact.  She reports normally morphine, Phenergan and steroids help with her symptoms.  She states that she was seen in the emergency department at [facility] yesterday and was discharged home.  Will check basic labs, urine.  We'll give IV morphine, IV Phenergan and IV fluids.  ED PROGRESS:  Patient's labs, urine for urine pregnancy test are unremarkable.  She reports that her symptoms are completely resolved after one dose of pain medication.  Have attempted to contact her neurologist to discuss whether or not she would like for [place] to start her on steroids.   2:58 PM  Spoke with Dr. Doy Hutching.  Given pt feels better, has no focal neuro deficits and is on Gilenya, she would not recommend steroids at this time.  She reports she can see pt in office this Friday.  Given return precautions.  Pt verbalized understanding and is comfortable with plan.     [place], DO 03/08/13 1459

## 2013-03-09 ENCOUNTER — Emergency Department (HOSPITAL_COMMUNITY)
Admission: EM | Admit: 2013-03-09 | Discharge: 2013-03-09 | Disposition: A | Discharge status: Home / Self Care | Payer: Medicare Other | Attending: Emergency Medicine | Admitting: Emergency Medicine

## 2013-03-09 ENCOUNTER — Encounter (HOSPITAL_COMMUNITY): Payer: Self-pay | Admitting: Emergency Medicine

## 2013-03-09 DIAGNOSIS — J45909 Unspecified asthma, uncomplicated: Secondary | ICD-10-CM | POA: Insufficient documentation

## 2013-03-09 DIAGNOSIS — Z975 Presence of (intrauterine) contraceptive device: Secondary | ICD-10-CM | POA: Insufficient documentation

## 2013-03-09 DIAGNOSIS — R5381 Other malaise: Secondary | ICD-10-CM | POA: Insufficient documentation

## 2013-03-09 DIAGNOSIS — G43909 Migraine, unspecified, not intractable, without status migrainosus: Secondary | ICD-10-CM | POA: Insufficient documentation

## 2013-03-09 DIAGNOSIS — K219 Gastro-esophageal reflux disease without esophagitis: Secondary | ICD-10-CM | POA: Insufficient documentation

## 2013-03-09 DIAGNOSIS — F17200 Nicotine dependence, unspecified, uncomplicated: Secondary | ICD-10-CM | POA: Insufficient documentation

## 2013-03-09 DIAGNOSIS — Z79899 Other long term (current) drug therapy: Secondary | ICD-10-CM | POA: Insufficient documentation

## 2013-03-09 DIAGNOSIS — R5383 Other fatigue: Secondary | ICD-10-CM | POA: Insufficient documentation

## 2013-03-09 DIAGNOSIS — M79609 Pain in unspecified limb: Secondary | ICD-10-CM | POA: Insufficient documentation

## 2013-03-09 DIAGNOSIS — M79605 Pain in left leg: Secondary | ICD-10-CM

## 2013-03-09 DIAGNOSIS — M79604 Pain in right leg: Secondary | ICD-10-CM

## 2013-03-09 DIAGNOSIS — G35 Multiple sclerosis: Secondary | ICD-10-CM | POA: Insufficient documentation

## 2013-03-09 DIAGNOSIS — Z933 Colostomy status: Secondary | ICD-10-CM | POA: Insufficient documentation

## 2013-03-09 DIAGNOSIS — F411 Generalized anxiety disorder: Secondary | ICD-10-CM | POA: Insufficient documentation

## 2013-03-09 DIAGNOSIS — Z3202 Encounter for pregnancy test, result negative: Secondary | ICD-10-CM | POA: Insufficient documentation

## 2013-03-09 LAB — CBC WITH DIFFERENTIAL/PLATELET
Basophils Absolute: 0 10*3/uL (ref 0.0–0.1)
Basophils Relative: 0 % (ref 0–1)
Eosinophils Absolute: 0.1 10*3/uL (ref 0.0–0.7)
Eosinophils Relative: 1 % (ref 0–5)
HCT: 41 % (ref 36.0–46.0)
Hemoglobin: 13.9 g/dL (ref 12.0–15.0)
LYMPHS ABS: 2.3 10*3/uL (ref 0.7–4.0)
Lymphocytes Relative: 25 % (ref 12–46)
MCH: 30.8 pg (ref 26.0–34.0)
MCHC: 33.9 g/dL (ref 30.0–36.0)
MCV: 90.7 fL (ref 78.0–100.0)
Monocytes Absolute: 0.3 10*3/uL (ref 0.1–1.0)
Monocytes Relative: 4 % (ref 3–12)
NEUTROS ABS: 6.3 10*3/uL (ref 1.7–7.7)
NEUTROS PCT: 70 % (ref 43–77)
PLATELETS: 333 10*3/uL (ref 150–400)
RBC: 4.52 MIL/uL (ref 3.87–5.11)
RDW: 13.6 % (ref 11.5–15.5)
WBC: 9 10*3/uL (ref 4.0–10.5)

## 2013-03-09 LAB — COMPREHENSIVE METABOLIC PANEL
ALK PHOS: 71 U/L (ref 39–117)
ALT: 8 U/L (ref 0–35)
AST: 13 U/L (ref 0–37)
Albumin: 3.6 g/dL (ref 3.5–5.2)
BUN: 8 mg/dL (ref 6–23)
CHLORIDE: 106 meq/L (ref 96–112)
CO2: 28 meq/L (ref 19–32)
Calcium: 9 mg/dL (ref 8.4–10.5)
Creatinine, Ser: 0.66 mg/dL (ref 0.50–1.10)
GFR calc Af Amer: >90 mL/min (ref >90)
Glucose, Bld: 84 mg/dL (ref 70–99)
POTASSIUM: 4.1 meq/L (ref 3.7–5.3)
SODIUM: 142 meq/L (ref 137–147)
Total Bilirubin: 0.4 mg/dL (ref 0.3–1.2)
Total Protein: 7 g/dL (ref 6.0–8.3)

## 2013-03-09 LAB — URINALYSIS, ROUTINE W REFLEX MICROSCOPIC
Glucose, UA: NEGATIVE mg/dL
HGB URINE DIPSTICK: NEGATIVE
KETONES UR: NEGATIVE mg/dL
Leukocytes, UA: NEGATIVE
NITRITE: NEGATIVE
Protein, ur: NEGATIVE mg/dL
Specific Gravity, Urine: 1.01 (ref 1.005–1.030)
UROBILINOGEN UA: 0.2 mg/dL (ref 0.0–1.0)
pH: 6 (ref 5.0–8.0)

## 2013-03-09 LAB — POC URINE PREG, ED: Preg Test, Ur: NEGATIVE

## 2013-03-09 MED ORDER — FENTANYL CITRATE 0.05 MG/ML IJ SOLN
50.0000 ug | Freq: Once | INTRAMUSCULAR | Status: AC
  Administered 2013-03-09: 50 ug via INTRAVENOUS
  Filled 2013-03-09: qty 2

## 2013-03-09 MED ORDER — METHYLPREDNISOLONE SODIUM SUCC 125 MG IJ SOLR
125.0000 mg | INTRAMUSCULAR | Status: AC
  Administered 2013-03-09: 125 mg via INTRAVENOUS
  Filled 2013-03-09: qty 2

## 2013-03-09 NOTE — ED Notes (Signed)
MS exacerbation for 5 days, Seen here yesterday for same., says pain in legs and headache is worse.  Had episode of bowel incontinence that was diarrhea 1 hour pta Nausea, no vomiting.

## 2013-03-09 NOTE — Discharge Instructions (Signed)
As discussed, although you have elected to leave prior to completion of your evaluation, it is important that you both follow up with your physician as scheduled, and return here if you develop new, or concerning changes in your condition.

## 2013-03-09 NOTE — ED Notes (Signed)
Patient given discharge instruction, verbalized understand. IV removed, band aid applied. Patient ambulatory out of the department.  

## 2013-03-09 NOTE — ED Provider Notes (Signed)
CSN: [id number]     Arrival date & time 03/09/13  1112 History  This chart was scribed for Carmin Muskrat, MD by Zettie Pho, ED Scribe. This patient was seen in room APA12/APA12 and the patient's care was started at 1:27 PM.    No chief complaint on file.  The history is provided by the patient. No language interpreter was used.   HPI Comments: Emily [name] is a 33 y.o. Female with a history of multiple sclerosis who presents to the Emergency Department complaining of "pain all over" with associated, diffuse headaches and unsteady gait that she states is chronic in nature secondary to her history of MS, but has been progressively worsening over the past 5 days. She reports a shooting pain to the bilateral legs that she states is new for her. She states that she was seen at [facility] a few days ago for similar complaints and was treated with a migraine cocktail. Patient was seen here yesterday for similar complaints and was treated with morphine and Phenergan. She states that she has an appointment with her neurologist in 3 days, but was advised to come to the ED if her pain persisted. She denies confusion, syncope, chest pain, shortness of breath, leg swelling. Patient has no other pertinent medical history.   Past Medical History  Diagnosis Date  . Headache(784.0)     migraines  . Multiple sclerosis   . Asthma as a child  . GERD (gastroesophageal reflux disease)   . Urinary urgency   . Anxiety   . Perforated bowel 2009   Past Surgical History  Procedure Laterality Date  . Extremity cyst excision  1994    right leg  . 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 Colostomy closure  04/2007  . Scar revision  01/21/2011    Procedure: SCAR REVISION;  Surgeon: Hermelinda Dellen;  Location: [place];  Service: Plastics;  Laterality: N/A;  exploration of scar of abdomen and repair of defect  . Hernia repair     History reviewed. No pertinent family history. History   Substance Use Topics  . Smoking status: Current Some Day Smoker  . Smokeless tobacco: Never Used     Comment: quit smoking 10 days ago  . Alcohol Use: No   OB History   Grav Para Term Preterm Abortions TAB SAB Ect Mult Living                 Review of Systems  Constitutional:       Per HPI, otherwise negative  HENT:       Per HPI, otherwise negative  Respiratory: Negative for shortness of breath.        Per HPI, otherwise negative  Cardiovascular: Negative for chest pain.       Per HPI, otherwise negative  Gastrointestinal: Negative for vomiting.  Endocrine:       Negative aside from HPI  Genitourinary:       Neg aside from HPI .    Musculoskeletal: Positive for arthralgias, gait problem and myalgias.       Per HPI, otherwise negative  Skin: Negative for color change.  Neurological: Positive for weakness and headaches. Negative for syncope.  Psychiatric/Behavioral: Negative for confusion.      Allergies  Amitriptyline; Baclofen; Cymbalta; Gabapentin; Other; Alprazolam; Vicodin; and Adhesive  Home Medications   Current Outpatient Rx  Name  Route  Sig  Dispense  Refill  . carisoprodol (SOMA) 350 MG tablet  Oral   Take 350 mg by mouth 3 (three) times daily.          . clonazePAM (KLONOPIN) 1 MG tablet   Oral   Take 1 mg by mouth 4 (four) times daily as needed for anxiety.         . divalproex (DEPAKOTE ER) 500 MG 24 hr tablet   Oral   Take 500 mg by mouth daily. For depression         . etodolac (LODINE) 400 MG tablet   Oral   Take 400 mg by mouth 2 (two) times daily.         . Fingolimod HCl (GILENYA) 0.5 MG CAPS   Oral   Take 1 capsule by mouth daily.         . hydrOXYzine (ATARAX/VISTARIL) 10 MG tablet   Oral   Take 10 mg by mouth daily as needed for itching.         . loratadine (CLARITIN) 10 MG tablet   Oral   Take 10 mg by mouth daily.           Marland Kitchen omeprazole (PRILOSEC) 40 MG capsule   Oral   Take 40 mg by mouth daily.           . ondansetron (ZOFRAN) 4 MG tablet   Oral   Take 4 mg by mouth every 8 (eight) hours as needed for nausea or vomiting.          Marland Kitchen oxyCODONE (ROXICODONE) 15 MG immediate release tablet   Oral   Take 15 mg by mouth every 4 (four) hours as needed for pain.          . potassium chloride (K-DUR) 10 MEQ tablet   Oral   Take 10 mEq by mouth 2 (two) times daily.         . pregabalin (LYRICA) 200 MG capsule   Oral   Take 200 mg by mouth at bedtime.          . ranitidine (ZANTAC) 300 MG capsule   Oral   Take 300 mg by mouth every evening.           . SUMAtriptan (IMITREX) 6 MG/0.5ML SOLN injection   Subcutaneous   Inject 6 mg into the skin every 2 (two) hours as needed for migraine or headache. May repeat in 2 hours if headache persists or recurs.         . topiramate (TOPAMAX) 100 MG tablet   Oral   Take 100 mg by mouth 2 (two) times daily.          Marland Kitchen venlafaxine (EFFEXOR) 75 MG tablet   Oral   Take 75 mg by mouth daily.         Marland Kitchen EPINEPHrine (EPIPEN) 0.3 mg/0.3 mL SOAJ injection   Intramuscular   Inject 0.3 mg into the muscle once.         Marland Kitchen levonorgestrel (MIRENA) 20 MCG/24HR IUD   Intrauterine   1 each by Intrauterine route once.          Triage Vitals: BP 154/105  Pulse 96  Temp(Src) 98.1 F (36.7 C) (Oral)  Resp 20  Ht 5\' 5"  (1.651 m)  Wt 190 lb (86.183 kg)  BMI 31.62 kg/m2  SpO2 100%  Physical Exam  Nursing note and vitals reviewed. Constitutional: She is oriented to person, place, and time. She appears well-developed and well-nourished. No distress.  HENT:  Head: Normocephalic and atraumatic.  Eyes: Conjunctivae and  EOM are normal.  Cardiovascular: Normal rate and regular rhythm.   Pulmonary/Chest: Effort normal and breath sounds normal. No stridor. No respiratory distress.  Abdominal: She exhibits no distension.  Musculoskeletal: She exhibits no edema.  Neurological: She is alert and oriented to person, place, and time. No cranial  nerve deficit.  5/5 strength to bilateral upper extremities. No pronator drift. Normal strength and sensation of bilateral lower extremities.   Skin: Skin is warm and dry.  Psychiatric: She has a normal mood and affect.    ED Course  Procedures (including critical care time)  DIAGNOSTIC STUDIES: Oxygen Saturation is 100% on room air, normal by my interpretation.    COORDINATION OF CARE: 1:34 PM- Will order Solu Medrol and Sublimaze to manage symptoms. Will order UA and blood labs (CBC, CMP). Discussed treatment plan with patient at bedside and patient verbalized agreement.     Labs Review Labs Reviewed  CBC WITH DIFFERENTIAL  COMPREHENSIVE METABOLIC PANEL  URINALYSIS, ROUTINE W REFLEX MICROSCOPIC  POC URINE PREG, ED   Initial labs WNL  2:59 PM Patient requests d/c. On re-exam she appears calm, non-toxic. She was provided return precautions / encouraged to f/u w PMD as scheduled in 3d.  MDM   Final diagnoses:  Lower extremity pain, bilateral     I personally performed the services described in this documentation, which was scribed in my presence. The recorded information has been reviewed and is accurate.   This young female with history of MS presents with bilateral lower extremity pain generalized complaints.  On exam she is neurologically intact and hemodynamically stable.  After initiation of evaluation and treatment patient requested discharge prior to completion of her evaluation.  Given her gross stability, this was accommodated.  No clear etiology for the patient's new lower extremity pain, though MS flare seems likely.     Carmin Muskrat, MD 03/09/13 1501

## 2013-03-14 ENCOUNTER — Encounter (HOSPITAL_COMMUNITY): Payer: Self-pay | Admitting: Emergency Medicine

## 2013-03-14 ENCOUNTER — Emergency Department (HOSPITAL_COMMUNITY)
Admission: EM | Admit: 2013-03-14 | Discharge: 2013-03-14 | Disposition: A | Discharge status: Home / Self Care | Payer: Medicare Other | Attending: Emergency Medicine | Admitting: Emergency Medicine

## 2013-03-14 DIAGNOSIS — K219 Gastro-esophageal reflux disease without esophagitis: Secondary | ICD-10-CM | POA: Insufficient documentation

## 2013-03-14 DIAGNOSIS — Z791 Long term (current) use of non-steroidal anti-inflammatories (NSAID): Secondary | ICD-10-CM | POA: Insufficient documentation

## 2013-03-14 DIAGNOSIS — J45909 Unspecified asthma, uncomplicated: Secondary | ICD-10-CM | POA: Insufficient documentation

## 2013-03-14 DIAGNOSIS — F411 Generalized anxiety disorder: Secondary | ICD-10-CM | POA: Insufficient documentation

## 2013-03-14 DIAGNOSIS — Z79899 Other long term (current) drug therapy: Secondary | ICD-10-CM | POA: Insufficient documentation

## 2013-03-14 DIAGNOSIS — IMO0001 Reserved for inherently not codable concepts without codable children: Secondary | ICD-10-CM | POA: Insufficient documentation

## 2013-03-14 DIAGNOSIS — M79604 Pain in right leg: Secondary | ICD-10-CM

## 2013-03-14 DIAGNOSIS — G43909 Migraine, unspecified, not intractable, without status migrainosus: Secondary | ICD-10-CM | POA: Insufficient documentation

## 2013-03-14 DIAGNOSIS — F17200 Nicotine dependence, unspecified, uncomplicated: Secondary | ICD-10-CM | POA: Insufficient documentation

## 2013-03-14 DIAGNOSIS — R000 Tachycardia, unspecified: Secondary | ICD-10-CM | POA: Insufficient documentation

## 2013-03-14 DIAGNOSIS — M79609 Pain in unspecified limb: Secondary | ICD-10-CM | POA: Insufficient documentation

## 2013-03-14 DIAGNOSIS — M79605 Pain in left leg: Secondary | ICD-10-CM

## 2013-03-14 LAB — BASIC METABOLIC PANEL
BUN: 14 mg/dL (ref 6–23)
CO2: 26 meq/L (ref 19–32)
Calcium: 8.9 mg/dL (ref 8.4–10.5)
Chloride: 104 mEq/L (ref 96–112)
Creatinine, Ser: 0.75 mg/dL (ref 0.50–1.10)
GFR calc Af Amer: >90 mL/min (ref >90)
GLUCOSE: 90 mg/dL (ref 70–99)
POTASSIUM: 4.3 meq/L (ref 3.7–5.3)
Sodium: 139 mEq/L (ref 137–147)

## 2013-03-14 MED ORDER — KETOROLAC TROMETHAMINE 30 MG/ML IJ SOLN
30.0000 mg | Freq: Once | INTRAMUSCULAR | Status: AC
  Administered 2013-03-14: 30 mg via INTRAMUSCULAR
  Filled 2013-03-14: qty 1

## 2013-03-14 MED ORDER — HYDROMORPHONE HCL PF 1 MG/ML IJ SOLN
1.0000 mg | Freq: Once | INTRAMUSCULAR | Status: AC
  Administered 2013-03-14: 1 mg via INTRAMUSCULAR
  Filled 2013-03-14: qty 1

## 2013-03-14 NOTE — ED Notes (Signed)
Pt reports has MS and has been having frequent pain in bilateral lower legs.  Reports was put on prednisone on March 5th.  Pt says is no better.

## 2013-03-14 NOTE — ED Provider Notes (Signed)
CSN: [id number]     Arrival date & time 03/14/13  1227 History   First MD Initiated Contact with Patient 03/14/13 1236     Chief Complaint  Patient presents with  . Leg Pain     (Consider location/radiation/quality/duration/timing/severity/associated sxs/prior Treatment) HPI  This is a 33 year old female with history of multiple sclerosis who presents with "pain all over." She reports pain mostly in her bilateral lower extremity. She states "I have been in and out of hospital and seen by neurologist and nothing is helping."  Patient was started on prednisone 3 days ago by her neurologist. She states that she feels "spikes coming out of her bilateral legs left greater than right. She denies any swelling of the legs. She reports chronic back pain without radiation down her legs. In addition to the prednisone she takes oxycodone for migraines, clonazepam for anxiety, and a muscle relaxer. She states that she's not getting any relief. She reports 10 out of 10 pain. She denies any history of diabetes.  Past Medical History  Diagnosis Date  . Headache(784.0)     migraines  . Multiple sclerosis   . Asthma as a child  . GERD (gastroesophageal reflux disease)   . Urinary urgency   . Anxiety   . Perforated bowel 2009   Past Surgical History  Procedure Laterality Date  . Extremity cyst excision  1994    right leg  . 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 Colostomy closure  04/2007  . Scar revision  01/21/2011    Procedure: SCAR REVISION;  Surgeon: Hermelinda Dellen;  Location: [place];  Service: Plastics;  Laterality: N/A;  exploration of scar of abdomen and repair of defect  . Hernia repair     No family history on file. History  Substance Use Topics  . Smoking status: Current Some Day Smoker  . Smokeless tobacco: Never Used     Comment: quit smoking 10 days ago  . Alcohol Use: No   OB History   Grav Para Term Preterm Abortions TAB SAB Ect Mult Living                  Review of Systems  Constitutional: Negative for fever.  Respiratory: Negative for cough, chest tightness and shortness of breath.   Cardiovascular: Negative for chest pain and leg swelling.  Gastrointestinal: Negative for nausea, vomiting and abdominal pain.  Genitourinary: Negative for dysuria.  Musculoskeletal: Positive for myalgias. Negative for back pain and gait problem.  Skin: Negative for wound.  Neurological: Negative for weakness, numbness and headaches.  Psychiatric/Behavioral: Negative for confusion.  All other systems reviewed and are negative.      Allergies  Amitriptyline; Baclofen; Cymbalta; Gabapentin; Other; Alprazolam; Vicodin; and Adhesive  Home Medications   Current Outpatient Rx  Name  Route  Sig  Dispense  Refill  . carisoprodol (SOMA) 350 MG tablet   Oral   Take 350 mg by mouth 3 (three) times daily.          . clonazePAM (KLONOPIN) 1 MG tablet   Oral   Take 1 mg by mouth 4 (four) times daily as needed for anxiety.         . divalproex (DEPAKOTE ER) 500 MG 24 hr tablet   Oral   Take 500 mg by mouth daily. For depression         . EPINEPHrine (EPIPEN) 0.3 mg/0.3 mL SOAJ injection   Intramuscular   Inject 0.3  mg into the muscle once.         . etodolac (LODINE) 400 MG tablet   Oral   Take 400 mg by mouth 2 (two) times daily.         . Fingolimod HCl (GILENYA) 0.5 MG CAPS   Oral   Take 1 capsule by mouth daily.         . hydrOXYzine (ATARAX/VISTARIL) 10 MG tablet   Oral   Take 10 mg by mouth daily as needed for itching.         Marland Kitchen levonorgestrel (MIRENA) 20 MCG/24HR IUD   Intrauterine   1 each by Intrauterine route once.         . loratadine (CLARITIN) 10 MG tablet   Oral   Take 10 mg by mouth daily.           . methylPREDNISolone (MEDROL DOSEPAK) 4 MG tablet   Oral   Take 4 mg by mouth. follow package directions.  Starting 03/12/2013.         Marland Kitchen omeprazole (PRILOSEC) 40 MG capsule   Oral   Take 40 mg by  mouth daily.          . ondansetron (ZOFRAN) 4 MG tablet   Oral   Take 4 mg by mouth every 8 (eight) hours as needed for nausea or vomiting.          Marland Kitchen oxyCODONE (ROXICODONE) 15 MG immediate release tablet   Oral   Take 15 mg by mouth every 4 (four) hours as needed for pain.          . potassium chloride (K-DUR) 10 MEQ tablet   Oral   Take 10 mEq by mouth 2 (two) times daily.         . pregabalin (LYRICA) 200 MG capsule   Oral   Take 200 mg by mouth at bedtime.          . ranitidine (ZANTAC) 300 MG capsule   Oral   Take 300 mg by mouth every evening.           . SUMAtriptan (IMITREX) 6 MG/0.5ML SOLN injection   Subcutaneous   Inject 6 mg into the skin every 2 (two) hours as needed for migraine or headache. May repeat in 2 hours if headache persists or recurs.         . topiramate (TOPAMAX) 100 MG tablet   Oral   Take 100 mg by mouth 2 (two) times daily.          Marland Kitchen venlafaxine (EFFEXOR) 75 MG tablet   Oral   Take 75 mg by mouth daily.          BP 150/103  Pulse 112  Temp(Src) 98 F (36.7 C) (Oral)  Resp 18  Ht 5\' 5"  (1.651 m)  Wt 180 lb (81.647 kg)  BMI 29.95 kg/m2  SpO2 100% Physical Exam  Nursing note and vitals reviewed. Constitutional: She is oriented to person, place, and time. She appears well-developed and well-nourished.  Anxious appearing  HENT:  Head: Normocephalic and atraumatic.  Eyes: Pupils are equal, round, and reactive to light.  Neck: Neck supple.  Cardiovascular: Regular rhythm and normal heart sounds.   Tachycardia  Pulmonary/Chest: Effort normal and breath sounds normal. No respiratory distress. She has no wheezes.  Abdominal: Soft. Bowel sounds are normal. There is no tenderness. There is no rebound.  Musculoskeletal: Normal range of motion. She exhibits no edema.  No skin changes noted to the bilateral  legs, no significant swelling, no tenderness to palpation, there is no midline back tenderness, step-off, or deformity   Neurological: She is alert and oriented to person, place, and time.  Skin: Skin is warm and dry.  Psychiatric: She has a normal mood and affect.    ED Course  Procedures (including critical care time) Labs Review Labs Reviewed  BASIC METABOLIC PANEL   Imaging Review No results found.   EKG Interpretation None      MDM   Final diagnoses:  Leg pain, bilateral    Patient presents with bilateral leg pain. She has been seen and evaluated multiple times for the same. She has also seen her neurologist. She is on multiple pain medications at home and is now currently on steroids. She is nonfocal on exam. No evidence of neurologic dysfunction. History is not consistent with radicular pain.  Given physical exam, no evidence of lower extremity swelling no other signs or symptoms of DVT, have low suspicion for DVT given the bilateral nature. She's also low risk for Wells criteria. BMP shows no evidence of potassium disruption. Patient was given IM Toradol and Dilaudid and reports complete improvement of her pain.  Patient was referred back to her neurologist if her symptoms persist.  Of note, have reviewed the patient's chart. She's been here multiple times. She tends to get one dose of pain medication and then is ready for discharge. She is on multiple pain dictations at home. This is somewhat suspicious for pain seeking behavior.  After history, exam, and medical workup I feel the patient has been appropriately medically screened and is safe for discharge home. Pertinent diagnoses were discussed with the patient. Patient was given return precautions.     Merryl Hacker, MD 03/14/13 1356

## 2013-03-14 NOTE — Discharge Instructions (Signed)
You were seen today for persistent bilateral leg pain. Lab work is unremarkable. Pain improved and you do not have any evidence of neurologic dysfunction. This may be related to her MS. You should continue taking steroids as directed by her neurologist. You should continue taking her home pain medications. If he has any new or worsening symptoms, focal weakness or numbness, difficulty urinating or any new symptoms you should return for further evaluation.

## 2013-03-15 ENCOUNTER — Encounter (HOSPITAL_COMMUNITY): Payer: Self-pay | Admitting: Emergency Medicine

## 2013-03-15 ENCOUNTER — Emergency Department (HOSPITAL_COMMUNITY)
Admission: EM | Admit: 2013-03-15 | Discharge: 2013-03-15 | Disposition: A | Discharge status: Home / Self Care | Payer: Medicare Other | Attending: Emergency Medicine | Admitting: Emergency Medicine

## 2013-03-15 DIAGNOSIS — G43909 Migraine, unspecified, not intractable, without status migrainosus: Secondary | ICD-10-CM | POA: Insufficient documentation

## 2013-03-15 DIAGNOSIS — Z79899 Other long term (current) drug therapy: Secondary | ICD-10-CM | POA: Insufficient documentation

## 2013-03-15 DIAGNOSIS — Z9889 Other specified postprocedural states: Secondary | ICD-10-CM | POA: Insufficient documentation

## 2013-03-15 DIAGNOSIS — Z791 Long term (current) use of non-steroidal anti-inflammatories (NSAID): Secondary | ICD-10-CM | POA: Insufficient documentation

## 2013-03-15 DIAGNOSIS — S99919A Unspecified injury of unspecified ankle, initial encounter: Principal | ICD-10-CM

## 2013-03-15 DIAGNOSIS — W19XXXA Unspecified fall, initial encounter: Secondary | ICD-10-CM | POA: Insufficient documentation

## 2013-03-15 DIAGNOSIS — Y929 Unspecified place or not applicable: Secondary | ICD-10-CM | POA: Insufficient documentation

## 2013-03-15 DIAGNOSIS — J45909 Unspecified asthma, uncomplicated: Secondary | ICD-10-CM | POA: Insufficient documentation

## 2013-03-15 DIAGNOSIS — F17200 Nicotine dependence, unspecified, uncomplicated: Secondary | ICD-10-CM | POA: Insufficient documentation

## 2013-03-15 DIAGNOSIS — S8990XA Unspecified injury of unspecified lower leg, initial encounter: Secondary | ICD-10-CM | POA: Insufficient documentation

## 2013-03-15 DIAGNOSIS — Y939 Activity, unspecified: Secondary | ICD-10-CM | POA: Insufficient documentation

## 2013-03-15 DIAGNOSIS — K219 Gastro-esophageal reflux disease without esophagitis: Secondary | ICD-10-CM | POA: Insufficient documentation

## 2013-03-15 DIAGNOSIS — S99929A Unspecified injury of unspecified foot, initial encounter: Principal | ICD-10-CM

## 2013-03-15 DIAGNOSIS — Z8669 Personal history of other diseases of the nervous system and sense organs: Secondary | ICD-10-CM | POA: Insufficient documentation

## 2013-03-15 DIAGNOSIS — F329 Major depressive disorder, single episode, unspecified: Secondary | ICD-10-CM | POA: Insufficient documentation

## 2013-03-15 DIAGNOSIS — F3289 Other specified depressive episodes: Secondary | ICD-10-CM | POA: Insufficient documentation

## 2013-03-15 DIAGNOSIS — F411 Generalized anxiety disorder: Secondary | ICD-10-CM | POA: Insufficient documentation

## 2013-03-15 HISTORY — DX: Migraine, unspecified, not intractable, without status migrainosus: G43.909

## 2013-03-15 HISTORY — DX: Depression, unspecified: F32.A

## 2013-03-15 HISTORY — DX: Major depressive disorder, single episode, unspecified: F32.9

## 2013-03-15 MED ORDER — KETOROLAC TROMETHAMINE 60 MG/2ML IM SOLN
60.0000 mg | Freq: Once | INTRAMUSCULAR | Status: AC
  Administered 2013-03-15: 60 mg via INTRAMUSCULAR
  Filled 2013-03-15: qty 2

## 2013-03-15 MED ORDER — MELOXICAM 7.5 MG PO TABS: 7.5000 mg | ORAL_TABLET | Freq: Every day | ORAL | Status: DC

## 2013-03-15 NOTE — ED Notes (Signed)
Pt says she fell  , that her legs "gave way"   Points to an old appearing contusion to lt knee.  Says she has been to her neurologist and taking prednisone.    Has been here recently several times  Due to pain and flare of MS.

## 2013-03-15 NOTE — Discharge Instructions (Signed)
Sciatica °Sciatica is pain, weakness, numbness, or tingling along the path of the sciatic nerve. The nerve starts in the lower back and runs down the back of each leg. The nerve controls the muscles in the lower leg and in the back of the knee, while also providing sensation to the back of the thigh, lower leg, and the sole of your foot. Sciatica is a symptom of another medical condition. For instance, nerve damage or certain conditions, such as a herniated disk or bone spur on the spine, pinch or put pressure on the sciatic nerve. This causes the pain, weakness, or other sensations normally associated with sciatica. Generally, sciatica only affects one side of the body. °CAUSES  °· Herniated or slipped disc. °· Degenerative disk disease. °· A pain disorder involving the narrow muscle in the buttocks (piriformis syndrome). °· Pelvic injury or fracture. °· Pregnancy. °· Tumor (rare). °SYMPTOMS  °Symptoms can vary from mild to very severe. The symptoms usually travel from the low back to the buttocks and down the back of the leg. Symptoms can include: °· Mild tingling or dull aches in the lower back, leg, or hip. °· Numbness in the back of the calf or sole of the foot. °· Burning sensations in the lower back, leg, or hip. °· Sharp pains in the lower back, leg, or hip. °· Leg weakness. °· Severe back pain inhibiting movement. °These symptoms may get worse with coughing, sneezing, laughing, or prolonged sitting or standing. Also, being overweight may worsen symptoms. °DIAGNOSIS  °Your caregiver will perform a physical exam to look for common symptoms of sciatica. He or she may ask you to do certain movements or activities that would trigger sciatic nerve pain. Other tests may be performed to find the cause of the sciatica. These may include: °· Blood tests. °· X-rays. °· Imaging tests, such as an MRI or CT scan. °TREATMENT  °Treatment is directed at the cause of the sciatic pain. Sometimes, treatment is not necessary  and the pain and discomfort goes away on its own. If treatment is needed, your caregiver may suggest: °· Over-the-counter medicines to relieve pain. °· Prescription medicines, such as anti-inflammatory medicine, muscle relaxants, or narcotics. °· Applying heat or ice to the painful area. °· Steroid injections to lessen pain, irritation, and inflammation around the nerve. °· Reducing activity during periods of pain. °· Exercising and stretching to strengthen your abdomen and improve flexibility of your spine. Your caregiver may suggest losing weight if the extra weight makes the back pain worse. °· Physical therapy. °· Surgery to eliminate what is pressing or pinching the nerve, such as a bone spur or part of a herniated disk. °HOME CARE INSTRUCTIONS  °· Only take over-the-counter or prescription medicines for pain or discomfort as directed by your caregiver. °· Apply ice to the affected area for 20 minutes, 3 4 times a day for the first 48 72 hours. Then try heat in the same way. °· Exercise, stretch, or perform your usual activities if these do not aggravate your pain. °· Attend physical therapy sessions as directed by your caregiver. °· Keep all follow-up appointments as directed by your caregiver. °· Do not wear high heels or shoes that do not provide proper support. °· Check your mattress to see if it is too soft. A firm mattress may lessen your pain and discomfort. °SEEK IMMEDIATE MEDICAL CARE IF:  °· You lose control of your bowel or bladder (incontinence). °· You have increasing weakness in the lower back,   pelvis, buttocks, or legs.  You have redness or swelling of your back.  You have a burning sensation when you urinate.  You have pain that gets worse when you lie down or awakens you at night.  Your pain is worse than you have experienced in the past.  Your pain is lasting longer than 4 weeks.  You are suddenly losing weight without reason. MAKE SURE YOU:  Understand these  instructions.  Will watch your condition.  Will get help right away if you are not doing well or get worse. Document Released: 12/18/2000 Document Revised: 06/25/2011 Document Reviewed: 05/05/2011 [facility] Patient Information 2014 [place].    Take meds as prescribed Follow-up with your regular MD on Friday

## 2013-03-15 NOTE — ED Provider Notes (Signed)
CSN: [id number]     Arrival date & time 03/15/13  1104 History   First MD Initiated Contact with Patient 03/15/13 1133     Chief Complaint  Patient presents with  . Fall     (Consider location/radiation/quality/duration/timing/severity/associated sxs/prior Treatment) Patient is a 33 y.o. female presenting with fall. The history is provided by the patient. No language interpreter was used.  Fall This is a recurrent problem. Associated symptoms include arthralgias and myalgias. Pertinent negatives include no chills, fever, numbness, rash or weakness.   Pt is a 33 year old female who presents after falling this morning. She reports a history of MS and says that her legs have been hurting and she has been having burning pain. She has recently seen her neurologist and is taking a prednisone taper. She has an appt with her regular MD this Friday. She denies any numbness or tingling. No focal weakness.    Past Medical History  Diagnosis Date  . Headache(784.0)     migraines  . Multiple sclerosis   . Asthma as a child  . GERD (gastroesophageal reflux disease)   . Urinary urgency   . Anxiety   . Perforated bowel 2009  . Migraines   . Depression    Past Surgical History  Procedure Laterality Date  . Extremity cyst excision  1994    right leg  . 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 Colostomy closure  04/2007  . Scar revision  01/21/2011    Procedure: SCAR REVISION;  Surgeon: Rosalio Macadamia;  Location: [facility];  Service: Plastics;  Laterality: N/A;  exploration of scar of abdomen and repair of defect  . Hernia repair    . Abdominal surgery     No family history on file. History  Substance Use Topics  . Smoking status: Current Some Day Smoker    Types: Cigarettes  . Smokeless tobacco: Never Used     Comment: quit smoking 10 days ago  . Alcohol Use: Yes     Comment: Occ   OB History   Grav Para Term Preterm Abortions TAB SAB Ect Mult Living         Review of Systems  Constitutional: Negative for fever and chills.  Musculoskeletal: Positive for arthralgias and myalgias.  Skin: Negative for rash.  Neurological: Negative for weakness and numbness.      Allergies  Amitriptyline; Baclofen; Cymbalta; Gabapentin; Other; Alprazolam; Vicodin; and Adhesive  Home Medications   Current Outpatient Rx  Name  Route  Sig  Dispense  Refill  . carisoprodol (SOMA) 350 MG tablet   Oral   Take 350 mg by mouth 3 (three) times daily.          . clonazePAM (KLONOPIN) 1 MG tablet   Oral   Take 1 mg by mouth 4 (four) times daily as needed for anxiety.         . divalproex (DEPAKOTE ER) 500 MG 24 hr tablet   Oral   Take 500 mg by mouth daily. For depression         . etodolac (LODINE) 400 MG tablet   Oral   Take 400 mg by mouth 2 (two) times daily.         . Fingolimod HCl (GILENYA) 0.5 MG CAPS   Oral   Take 1 capsule by mouth daily.         Marland Kitchen loratadine (CLARITIN) 10 MG tablet   Oral   Take 10  mg by mouth daily.           . methylPREDNISolone (MEDROL DOSEPAK) 4 MG tablet   Oral   Take 4 mg by mouth. follow package directions.  Starting 03/12/2013.         Marland Kitchen omeprazole (PRILOSEC) 40 MG capsule   Oral   Take 40 mg by mouth daily.          Marland Kitchen oxyCODONE (ROXICODONE) 15 MG immediate release tablet   Oral   Take 15 mg by mouth every 4 (four) hours as needed for pain.          . potassium chloride (K-DUR) 10 MEQ tablet   Oral   Take 10 mEq by mouth 2 (two) times daily.         . pregabalin (LYRICA) 200 MG capsule   Oral   Take 200 mg by mouth at bedtime.          . ranitidine (ZANTAC) 300 MG capsule   Oral   Take 300 mg by mouth every evening.           . topiramate (TOPAMAX) 100 MG tablet   Oral   Take 100 mg by mouth 2 (two) times daily.          Marland Kitchen venlafaxine (EFFEXOR) 75 MG tablet   Oral   Take 75 mg by mouth daily.         Marland Kitchen EPINEPHrine (EPIPEN) 0.3 mg/0.3 mL SOAJ injection    Intramuscular   Inject 0.3 mg into the muscle once.         . hydrOXYzine (ATARAX/VISTARIL) 10 MG tablet   Oral   Take 10 mg by mouth daily as needed for itching.         Marland Kitchen levonorgestrel (MIRENA) 20 MCG/24HR IUD   Intrauterine   1 each by Intrauterine route once.         . meloxicam (MOBIC) 7.5 MG tablet   Oral   Take 1 tablet (7.5 mg total) by mouth daily.   30 tablet   0   . ondansetron (ZOFRAN) 4 MG tablet   Oral   Take 4 mg by mouth every 8 (eight) hours as needed for nausea or vomiting.          . SUMAtriptan (IMITREX) 6 MG/0.5ML SOLN injection   Subcutaneous   Inject 6 mg into the skin every 2 (two) hours as needed for migraine or headache. May repeat in 2 hours if headache persists or recurs.          BP 152/101  Pulse 101  Temp(Src) 98.2 F (36.8 C) (Oral)  Resp 18  Ht 5\' 5"  (1.651 m)  Wt 180 lb (81.647 kg)  BMI 29.95 kg/m2  SpO2 100% Physical Exam  Nursing note and vitals reviewed. Constitutional: She is oriented to person, place, and time. She appears well-developed and well-nourished. No distress.  HENT:  Head: Atraumatic.  Cardiovascular: Normal rate, regular rhythm, normal heart sounds and intact distal pulses.   Pulmonary/Chest: Effort normal and breath sounds normal. No respiratory distress. She has no wheezes.  Musculoskeletal: Normal range of motion. She exhibits no edema and no tenderness.  Full ROM of extremities x 4. No numbness or tingling. No edema or erythema noted.   Neurological: She is alert and oriented to person, place, and time. Coordination normal.  Skin: Skin is warm and dry. No rash noted. No erythema.  Psychiatric: Judgment and thought content normal.    ED Course  Procedures (  including critical care time) Labs Review Labs Reviewed - No data to display Imaging Review No results found.   EKG Interpretation None      MDM   Final diagnoses:  Fall    No unilateral weakness or focal deficits. Pt reports that she  is seeing a neurologist and was recently put on a prednisone taper pack that she is currently taking. Toradol injection given here with some relief and meloxicam prescription given. Follow-up with your PCP. Return precautions given.      Elisha Headland, NP 03/18/13 2244

## 2013-03-15 NOTE — ED Notes (Signed)
Pt states she fell this morning. Pain to left leg, may have pain to right pain also. Hx of MS. Pt also fell March 4. Seen specialist on Friday and placed on prednisone taper.

## 2013-03-18 NOTE — ED Provider Notes (Signed)
Medical screening examination/treatment/procedure(s) were performed by non-physician practitioner and as supervising physician I was immediately available for consultation/collaboration.   EKG Interpretation None        Maudry Diego, MD 03/18/13 2247

## 2013-04-06 ENCOUNTER — Encounter (HOSPITAL_COMMUNITY): Payer: Self-pay | Admitting: Emergency Medicine

## 2013-04-06 ENCOUNTER — Emergency Department (HOSPITAL_COMMUNITY): Payer: Medicare Other

## 2013-04-06 ENCOUNTER — Emergency Department (HOSPITAL_COMMUNITY)
Admission: EM | Admit: 2013-04-06 | Discharge: 2013-04-06 | Disposition: A | Discharge status: Home / Self Care | Payer: Medicare Other | Attending: Emergency Medicine | Admitting: Emergency Medicine

## 2013-04-06 ENCOUNTER — Emergency Department (HOSPITAL_COMMUNITY)
Admission: EM | Admit: 2013-04-06 | Discharge: 2013-04-06 | Disposition: A | Discharge status: Home / Self Care | Payer: Medicare Other | Source: Home / Self Care | Attending: Emergency Medicine | Admitting: Emergency Medicine

## 2013-04-06 DIAGNOSIS — J45909 Unspecified asthma, uncomplicated: Secondary | ICD-10-CM

## 2013-04-06 DIAGNOSIS — R109 Unspecified abdominal pain: Secondary | ICD-10-CM

## 2013-04-06 DIAGNOSIS — Z79899 Other long term (current) drug therapy: Secondary | ICD-10-CM | POA: Insufficient documentation

## 2013-04-06 DIAGNOSIS — K219 Gastro-esophageal reflux disease without esophagitis: Secondary | ICD-10-CM | POA: Insufficient documentation

## 2013-04-06 DIAGNOSIS — Z791 Long term (current) use of non-steroidal anti-inflammatories (NSAID): Secondary | ICD-10-CM | POA: Insufficient documentation

## 2013-04-06 DIAGNOSIS — G35 Multiple sclerosis: Secondary | ICD-10-CM | POA: Insufficient documentation

## 2013-04-06 DIAGNOSIS — F329 Major depressive disorder, single episode, unspecified: Secondary | ICD-10-CM

## 2013-04-06 DIAGNOSIS — R1084 Generalized abdominal pain: Secondary | ICD-10-CM | POA: Insufficient documentation

## 2013-04-06 DIAGNOSIS — G43909 Migraine, unspecified, not intractable, without status migrainosus: Secondary | ICD-10-CM

## 2013-04-06 DIAGNOSIS — G8929 Other chronic pain: Secondary | ICD-10-CM

## 2013-04-06 DIAGNOSIS — F411 Generalized anxiety disorder: Secondary | ICD-10-CM

## 2013-04-06 DIAGNOSIS — F3289 Other specified depressive episodes: Secondary | ICD-10-CM | POA: Insufficient documentation

## 2013-04-06 DIAGNOSIS — Z3202 Encounter for pregnancy test, result negative: Secondary | ICD-10-CM | POA: Insufficient documentation

## 2013-04-06 DIAGNOSIS — K5289 Other specified noninfective gastroenteritis and colitis: Secondary | ICD-10-CM | POA: Insufficient documentation

## 2013-04-06 DIAGNOSIS — F17200 Nicotine dependence, unspecified, uncomplicated: Secondary | ICD-10-CM | POA: Insufficient documentation

## 2013-04-06 DIAGNOSIS — Z9889 Other specified postprocedural states: Secondary | ICD-10-CM | POA: Insufficient documentation

## 2013-04-06 DIAGNOSIS — R197 Diarrhea, unspecified: Secondary | ICD-10-CM | POA: Insufficient documentation

## 2013-04-06 DIAGNOSIS — R1110 Vomiting, unspecified: Secondary | ICD-10-CM

## 2013-04-06 DIAGNOSIS — R112 Nausea with vomiting, unspecified: Secondary | ICD-10-CM

## 2013-04-06 HISTORY — DX: Dorsalgia, unspecified: M54.9

## 2013-04-06 HISTORY — DX: Other chronic pain: G89.29

## 2013-04-06 LAB — CBC WITH DIFFERENTIAL/PLATELET
Basophils Absolute: 0 10*3/uL (ref 0.0–0.1)
Basophils Relative: 0 % (ref 0–1)
EOS ABS: 0.1 10*3/uL (ref 0.0–0.7)
Eosinophils Relative: 1 % (ref 0–5)
HCT: 44.1 % (ref 36.0–46.0)
HEMOGLOBIN: 14.7 g/dL (ref 12.0–15.0)
LYMPHS ABS: 1.8 10*3/uL (ref 0.7–4.0)
LYMPHS PCT: 25 % (ref 12–46)
MCH: 30.4 pg (ref 26.0–34.0)
MCHC: 33.3 g/dL (ref 30.0–36.0)
MCV: 91.1 fL (ref 78.0–100.0)
Monocytes Absolute: 0.4 10*3/uL (ref 0.1–1.0)
Monocytes Relative: 5 % (ref 3–12)
NEUTROS ABS: 4.8 10*3/uL (ref 1.7–7.7)
Neutrophils Relative %: 69 % (ref 43–77)
PLATELETS: 335 10*3/uL (ref 150–400)
RBC: 4.84 MIL/uL (ref 3.87–5.11)
RDW: 13.5 % (ref 11.5–15.5)
WBC: 7 10*3/uL (ref 4.0–10.5)

## 2013-04-06 LAB — COMPREHENSIVE METABOLIC PANEL
ALK PHOS: 88 U/L (ref 39–117)
ALT: 11 U/L (ref 0–35)
AST: 23 U/L (ref 0–37)
Albumin: 3.8 g/dL (ref 3.5–5.2)
BUN: 9 mg/dL (ref 6–23)
CHLORIDE: 103 meq/L (ref 96–112)
CO2: 26 meq/L (ref 19–32)
Calcium: 9.3 mg/dL (ref 8.4–10.5)
Creatinine, Ser: 0.63 mg/dL (ref 0.50–1.10)
GFR calc Af Amer: >90 mL/min (ref >90)
GFR calc non Af Amer: >90 mL/min (ref >90)
Glucose, Bld: 97 mg/dL (ref 70–99)
POTASSIUM: 3.9 meq/L (ref 3.7–5.3)
SODIUM: 142 meq/L (ref 137–147)
Total Bilirubin: 0.3 mg/dL (ref 0.3–1.2)
Total Protein: 7.9 g/dL (ref 6.0–8.3)

## 2013-04-06 LAB — URINALYSIS, ROUTINE W REFLEX MICROSCOPIC
BILIRUBIN URINE: NEGATIVE
Glucose, UA: NEGATIVE mg/dL
HGB URINE DIPSTICK: NEGATIVE
KETONES UR: NEGATIVE mg/dL
Leukocytes, UA: NEGATIVE
Nitrite: NEGATIVE
Protein, ur: NEGATIVE mg/dL
UROBILINOGEN UA: 0.2 mg/dL (ref 0.0–1.0)
pH: 5.5 (ref 5.0–8.0)

## 2013-04-06 LAB — PREGNANCY, URINE: Preg Test, Ur: NEGATIVE

## 2013-04-06 LAB — LIPASE, BLOOD: Lipase: 23 U/L (ref 11–59)

## 2013-04-06 MED ORDER — PROCHLORPERAZINE MALEATE 10 MG PO TABS: 10.0000 mg | ORAL_TABLET | Freq: Four times a day (QID) | ORAL | Status: DC | PRN

## 2013-04-06 MED ORDER — PROMETHAZINE HCL 25 MG/ML IJ SOLN
25.0000 mg | Freq: Once | INTRAMUSCULAR | Status: AC
  Administered 2013-04-06: 25 mg via INTRAMUSCULAR
  Filled 2013-04-06: qty 1

## 2013-04-06 MED ORDER — SODIUM CHLORIDE 0.9 % IV SOLN
1000.0000 mL | Freq: Once | INTRAVENOUS | Status: AC
  Administered 2013-04-06: 1000 mL via INTRAVENOUS

## 2013-04-06 MED ORDER — DICYCLOMINE HCL 20 MG PO TABS: 20.0000 mg | ORAL_TABLET | Freq: Four times a day (QID) | ORAL | Status: DC | PRN

## 2013-04-06 MED ORDER — ONDANSETRON HCL 4 MG/2ML IJ SOLN
4.0000 mg | Freq: Once | INTRAMUSCULAR | Status: AC
  Administered 2013-04-06: 4 mg via INTRAVENOUS
  Filled 2013-04-06: qty 2

## 2013-04-06 MED ORDER — DICYCLOMINE HCL 10 MG PO CAPS
20.0000 mg | ORAL_CAPSULE | Freq: Once | ORAL | Status: AC
  Administered 2013-04-06: 20 mg via ORAL
  Filled 2013-04-06: qty 2

## 2013-04-06 MED ORDER — SODIUM CHLORIDE 0.9 % IV SOLN: 1000.0000 mL | INTRAVENOUS | Status: DC

## 2013-04-06 MED ORDER — HYDROMORPHONE HCL PF 1 MG/ML IJ SOLN
0.5000 mg | INTRAMUSCULAR | Status: DC | PRN
Start: 2013-04-06 — End: 2013-04-06
  Administered 2013-04-06: 0.5 mg via INTRAVENOUS
  Filled 2013-04-06: qty 1

## 2013-04-06 NOTE — ED Provider Notes (Signed)
CSN: [id number]     Arrival date & time 04/06/13  1629 History   First MD Initiated Contact with Patient 04/06/13 1820     Chief Complaint  Patient presents with  . Abdominal Pain      HPI Pt was seen at 1835. Per pt, c/o gradual onset and persistence of multiple intermittent episodes of N/V/D that began 3 days ago. Describes the stools as "watery." Has been associated with generalized abd "cramping." Pt was evaluated by her PMD yesterday for same, dx gastroenteritis, rx phenergan. Pt was also evaluated in the ED this morning for same, dx gastroenteritis, rx phenergan. Pt states her symptoms improved while she was in the ED, but the abd cramps and N/V returned 3 hours ago when she tried to eat. Denies further diarrhea, no CP/SOB, no back pain, no fevers, no black or blood in stools or emesis.     Past Medical History  Diagnosis Date  . Headache(784.0)     migraines  . Multiple sclerosis   . Asthma as a child  . GERD (gastroesophageal reflux disease)   . Urinary urgency   . Anxiety   . Perforated bowel 2009  . Migraines   . Depression   . Chronic back pain    Past Surgical History  Procedure Laterality Date  . Extremity cyst excision  1994    right leg  . 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 Colostomy closure  04/2007  . Scar revision  01/21/2011    Procedure: SCAR REVISION;  Surgeon: Hermelinda Dellen;  Location: [facility];  Service: Plastics;  Laterality: N/A;  exploration of scar of abdomen and repair of defect  . Hernia repair    . Abdominal surgery    . Appendectomy      History  Substance Use Topics  . Smoking status: Current Some Day Smoker    Types: Cigarettes  . Smokeless tobacco: Never Used     Comment: quit smoking 10 days ago  . Alcohol Use: Yes     Comment: Occ    Review of Systems ROS: Statement: All systems negative except as marked or noted in the HPI; Constitutional: Negative for fever and chills. ; ; Eyes: Negative for eye  pain, redness and discharge. ; ; ENMT: Negative for ear pain, hoarseness, nasal congestion, sinus pressure and sore throat. ; ; Cardiovascular: Negative for chest pain, palpitations, diaphoresis, dyspnea and peripheral edema. ; ; Respiratory: Negative for cough, wheezing and stridor. ; ; Gastrointestinal: +N/V/D, abd "cramps." Negative for blood in stool, hematemesis, jaundice and rectal bleeding. . ; ; Genitourinary: Negative for dysuria, flank pain and hematuria. ; ; Musculoskeletal: Negative for back pain and neck pain. Negative for swelling and trauma.; ; Skin: Negative for pruritus, rash, abrasions, blisters, bruising and skin lesion.; ; Neuro: Negative for headache, lightheadedness and neck stiffness. Negative for weakness, altered level of consciousness , altered mental status, extremity weakness, paresthesias, involuntary movement, seizure and syncope.       Allergies  Amitriptyline; Baclofen; Cymbalta; Gabapentin; Other; Alprazolam; Vicodin; and Adhesive  Home Medications   Current Outpatient Rx  Name  Route  Sig  Dispense  Refill  . clonazePAM (KLONOPIN) 1 MG tablet   Oral   Take 1 mg by mouth 4 (four) times daily as needed for anxiety.         . divalproex (DEPAKOTE ER) 500 MG 24 hr tablet   Oral   Take 500 mg by mouth daily.  For depression         . EPINEPHrine (EPIPEN) 0.3 mg/0.3 mL SOAJ injection   Intramuscular   Inject 0.3 mg into the muscle once.         . etodolac (LODINE) 400 MG tablet   Oral   Take 400 mg by mouth 2 (two) times daily.         . Fingolimod HCl (GILENYA) 0.5 MG CAPS   Oral   Take 1 capsule by mouth daily.         . hydrOXYzine (ATARAX/VISTARIL) 10 MG tablet   Oral   Take 10 mg by mouth daily as needed for itching.         Marland Kitchen levonorgestrel (MIRENA) 20 MCG/24HR IUD   Intrauterine   1 each by Intrauterine route once.         . loratadine (CLARITIN) 10 MG tablet   Oral   Take 10 mg by mouth daily.           . meloxicam (MOBIC)  7.5 MG tablet   Oral   Take 1 tablet (7.5 mg total) by mouth daily.   30 tablet   0   . omeprazole (PRILOSEC) 40 MG capsule   Oral   Take 40 mg by mouth daily.          . ondansetron (ZOFRAN) 4 MG tablet   Oral   Take 4 mg by mouth every 8 (eight) hours as needed for nausea or vomiting.          Marland Kitchen oxyCODONE (ROXICODONE) 15 MG immediate release tablet   Oral   Take 15 mg by mouth every 4 (four) hours as needed for pain.          . potassium chloride (K-DUR) 10 MEQ tablet   Oral   Take 10 mEq by mouth 2 (two) times daily.         . pregabalin (LYRICA) 200 MG capsule   Oral   Take 200 mg by mouth at bedtime.          . prochlorperazine (COMPAZINE) 10 MG tablet   Oral   Take 1 tablet (10 mg total) by mouth every 6 (six) hours as needed for nausea or vomiting.   12 tablet   0   . ranitidine (ZANTAC) 300 MG capsule   Oral   Take 300 mg by mouth every evening.           . SUMAtriptan (IMITREX) 6 MG/0.5ML SOLN injection   Subcutaneous   Inject 6 mg into the skin every 2 (two) hours as needed for migraine or headache. May repeat in 2 hours if headache persists or recurs.         . topiramate (TOPAMAX) 100 MG tablet   Oral   Take 100 mg by mouth 2 (two) times daily.          Marland Kitchen venlafaxine (EFFEXOR) 75 MG tablet   Oral   Take 75 mg by mouth daily.         . methylPREDNISolone (MEDROL DOSEPAK) 4 MG tablet   Oral   Take 4 mg by mouth. follow package directions.  Starting 03/12/2013.          BP 148/95  Pulse 79  Temp(Src) 98.2 F (36.8 C) (Oral)  Resp 18  Ht 5\' 5"  (1.651 m)  Wt 190 lb (86.183 kg)  BMI 31.62 kg/m2  SpO2 100% Physical Exam 1840: Physical examination:  Nursing notes reviewed; Vital signs and O2  SAT reviewed;  Constitutional: Well developed, Well nourished, Well hydrated, In no acute distress; Head:  Normocephalic, atraumatic; Eyes: EOMI, PERRL, No scleral icterus; ENMT: Mouth and pharynx normal, Mucous membranes moist; Neck: Supple,  Full range of motion, No lymphadenopathy; Cardiovascular: Regular rate and rhythm, No murmur, rub, or gallop; Respiratory: Breath sounds clear & equal bilaterally, No rales, rhonchi, wheezes.  Speaking full sentences with ease, Normal respiratory effort/excursion; Chest: Nontender, Movement normal; Abdomen: Soft, Nontender, Nondistended, Normal bowel sounds; Genitourinary: No CVA tenderness; Extremities: Pulses normal, No tenderness, No edema, No calf edema or asymmetry.; Neuro: AA&Ox3, Major CN grossly intact.  Speech clear. No gross focal motor or sensory deficits in extremities. Climbs on and off stretcher easily by herself. Gait steady.; Skin: Color normal, Warm, Dry.    ED Course  Procedures   EKG Interpretation None      MDM  MDM Reviewed: previous chart, nursing note and vitals Reviewed previous: labs Interpretation: x-ray    Results for orders placed during the hospital encounter of 04/06/13  CBC WITH DIFFERENTIAL      Result Value Ref Range   WBC 7.0  4.0 - 10.5 K/uL   RBC 4.84  3.87 - 5.11 MIL/uL   Hemoglobin 14.7  12.0 - 15.0 g/dL   HCT 44.1  36.0 - 46.0 %   MCV 91.1  78.0 - 100.0 fL   MCH 30.4  26.0 - 34.0 pg   MCHC 33.3  30.0 - 36.0 g/dL   RDW 13.5  11.5 - 15.5 %   Platelets 335  150 - 400 K/uL   Neutrophils Relative % 69  43 - 77 %   Neutro Abs 4.8  1.7 - 7.7 K/uL   Lymphocytes Relative 25  12 - 46 %   Lymphs Abs 1.8  0.7 - 4.0 K/uL   Monocytes Relative 5  3 - 12 %   Monocytes Absolute 0.4  0.1 - 1.0 K/uL   Eosinophils Relative 1  0 - 5 %   Eosinophils Absolute 0.1  0.0 - 0.7 K/uL   Basophils Relative 0  0 - 1 %   Basophils Absolute 0.0  0.0 - 0.1 K/uL  COMPREHENSIVE METABOLIC PANEL      Result Value Ref Range   Sodium 142  137 - 147 mEq/L   Potassium 3.9  3.7 - 5.3 mEq/L   Chloride 103  96 - 112 mEq/L   CO2 26  19 - 32 mEq/L   Glucose, Bld 97  70 - 99 mg/dL   BUN 9  6 - 23 mg/dL   Creatinine, Ser 0.63  0.50 - 1.10 mg/dL   Calcium 9.3  8.4 - 10.5 mg/dL    Total Protein 7.9  6.0 - 8.3 g/dL   Albumin 3.8  3.5 - 5.2 g/dL   AST 23  0 - 37 U/L   ALT 11  0 - 35 U/L   Alkaline Phosphatase 88  39 - 117 U/L   Total Bilirubin 0.3  0.3 - 1.2 mg/dL   GFR calc non Af Amer >90  >90 mL/min   GFR calc Af Amer >90  >90 mL/min  LIPASE, BLOOD      Result Value Ref Range   Lipase 23  11 - 59 U/L  URINALYSIS, ROUTINE W REFLEX MICROSCOPIC      Result Value Ref Range   Color, Urine YELLOW  YELLOW   APPearance CLEAR  CLEAR   Specific Gravity, Urine >1.030 (*) 1.005 - 1.030   pH 5.5  5.0 - 8.0   Glucose, UA NEGATIVE  NEGATIVE mg/dL   Hgb urine dipstick NEGATIVE  NEGATIVE   Bilirubin Urine NEGATIVE  NEGATIVE   Ketones, ur NEGATIVE  NEGATIVE mg/dL   Protein, ur NEGATIVE  NEGATIVE mg/dL   Urobilinogen, UA 0.2  0.0 - 1.0 mg/dL   Nitrite NEGATIVE  NEGATIVE   Leukocytes, UA NEGATIVE  NEGATIVE  PREGNANCY, URINE      Result Value Ref Range   Preg Test, Ur NEGATIVE  NEGATIVE   Dg Abd Acute W/chest 04/06/2013   CLINICAL DATA:  Umbilical pain, history of prior colon surgery  EXAM: ACUTE ABDOMEN SERIES (ABDOMEN 2 VIEW & CHEST 1 VIEW)  COMPARISON:  None.  FINDINGS: There is no evidence of dilated bowel loops or free intraperitoneal air. No radiopaque calculi or other significant radiographic abnormality is seen. Heart size and mediastinal contours are within normal limits. Both lungs are clear. Mild scoliosis of the spine. IUD noted in the pelvis located in the midline.  IMPRESSION: No acute chest or abdominal finding by plain radiography   Electronically Signed   By: Daryll Brod M.D.   On: 04/06/2013 18:56    2005:   UA/preg and labs from 9 hours ago were without acute abnl and not repeated. AXR reassuring. Pt has tol PO well while in the ED without N/V after IM phenergan and PO bentyl.  No stooling while in the ED.  Abd remains benign, VSS. Feels better and wants to go home now. Dx and testing d/w pt.  Questions answered.  Verb understanding, agreeable to d/c home  with outpt f/u.     Alfonzo Feller, DO 04/07/13 2128

## 2013-04-06 NOTE — ED Provider Notes (Signed)
CSN: [id number]     Arrival date & time 04/06/13  4742 History  This chart was scribed for Kathalene Frames, MD by Roe Coombs, ED Scribe. The patient was seen in room APA09/APA09. Patient's care was started at 9:09 AM.  Chief Complaint  Patient presents with  . Abdominal Pain   Patient is a 33 y.o. female presenting with cramps. The history is provided by the patient. No language interpreter was used.  Abdominal Cramping Pain location:  Generalized Pain quality: cramping   Pain radiates to:  Does not radiate Duration:  3 days Timing:  Constant Progression:  Unchanged Chronicity:  New Ineffective treatments:  Heat (and Phenergan) Associated symptoms: diarrhea, nausea and vomiting   Associated symptoms: no chest pain, no cough, no fever and no shortness of breath   Diarrhea:    Diarrhea timing: every 10 minutes. Vomiting:    Number of occurrences:  10 today   HPI Comments: Emily [name] is a 33 y.o. female who presents to the Emergency Department complaining of constant, severe abdominal cramping onset 3 days ago. There is associated nausea, vomiting, diarrhea, and headache. She reports 10 episodes of vomiting this morning and episodes of diarrhea every 10 minutes. She saw her PCP who diagnosed her with gastroenteritis and he prescribed Phenergan yesterday. Patient states that Phenergan has not provided any relief. She has also been applying heat to her abdomen without pain relief. She has an abdominal surgical history of bowel resection secondary to intestinal rupture and hernia repair. She has a medical history of anxiety, migraines, depression, GERD.  Past Medical History  Diagnosis Date  . Headache(784.0)     migraines  . Multiple sclerosis   . Asthma as a child  . GERD (gastroesophageal reflux disease)   . Urinary urgency   . Anxiety   . Perforated bowel 2009  . Migraines   . Depression    Past Surgical History  Procedure Laterality Date  . Extremity cyst excision  1994     right leg  . 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 Colostomy closure  04/2007  . Scar revision  01/21/2011    Procedure: SCAR REVISION;  Surgeon: Hermelinda Dellen;  Location: [facility];  Service: Plastics;  Laterality: N/A;  exploration of scar of abdomen and repair of defect  . Hernia repair    . Abdominal surgery     No family history on file. History  Substance Use Topics  . Smoking status: Current Some Day Smoker    Types: Cigarettes  . Smokeless tobacco: Never Used     Comment: quit smoking 10 days ago  . Alcohol Use: Yes     Comment: Occ    Review of Systems  Constitutional: Negative for fever.  Respiratory: Negative for cough and shortness of breath.   Cardiovascular: Negative for chest pain.  Gastrointestinal: Positive for nausea, vomiting, abdominal pain and diarrhea.  Neurological: Positive for headaches.  All other systems reviewed and are negative.    Allergies  Amitriptyline; Baclofen; Cymbalta; Gabapentin; Other; Alprazolam; Vicodin; and Adhesive  Home Medications   Current Outpatient Rx  Name  Route  Sig  Dispense  Refill  . carisoprodol (SOMA) 350 MG tablet   Oral   Take 350 mg by mouth 3 (three) times daily.          . clonazePAM (KLONOPIN) 1 MG tablet   Oral   Take 1 mg by mouth 4 (four) times daily as  needed for anxiety.         . divalproex (DEPAKOTE ER) 500 MG 24 hr tablet   Oral   Take 500 mg by mouth daily. For depression         . EPINEPHrine (EPIPEN) 0.3 mg/0.3 mL SOAJ injection   Intramuscular   Inject 0.3 mg into the muscle once.         . etodolac (LODINE) 400 MG tablet   Oral   Take 400 mg by mouth 2 (two) times daily.         . Fingolimod HCl (GILENYA) 0.5 MG CAPS   Oral   Take 1 capsule by mouth daily.         . hydrOXYzine (ATARAX/VISTARIL) 10 MG tablet   Oral   Take 10 mg by mouth daily as needed for itching.         Marland Kitchen levonorgestrel (MIRENA) 20 MCG/24HR IUD   Intrauterine   1  each by Intrauterine route once.         . loratadine (CLARITIN) 10 MG tablet   Oral   Take 10 mg by mouth daily.           . meloxicam (MOBIC) 7.5 MG tablet   Oral   Take 1 tablet (7.5 mg total) by mouth daily.   30 tablet   0   . methylPREDNISolone (MEDROL DOSEPAK) 4 MG tablet   Oral   Take 4 mg by mouth. follow package directions.  Starting 03/12/2013.         Marland Kitchen omeprazole (PRILOSEC) 40 MG capsule   Oral   Take 40 mg by mouth daily.          . ondansetron (ZOFRAN) 4 MG tablet   Oral   Take 4 mg by mouth every 8 (eight) hours as needed for nausea or vomiting.          Marland Kitchen oxyCODONE (ROXICODONE) 15 MG immediate release tablet   Oral   Take 15 mg by mouth every 4 (four) hours as needed for pain.          . potassium chloride (K-DUR) 10 MEQ tablet   Oral   Take 10 mEq by mouth 2 (two) times daily.         . pregabalin (LYRICA) 200 MG capsule   Oral   Take 200 mg by mouth at bedtime.          . ranitidine (ZANTAC) 300 MG capsule   Oral   Take 300 mg by mouth every evening.           . SUMAtriptan (IMITREX) 6 MG/0.5ML SOLN injection   Subcutaneous   Inject 6 mg into the skin every 2 (two) hours as needed for migraine or headache. May repeat in 2 hours if headache persists or recurs.         . topiramate (TOPAMAX) 100 MG tablet   Oral   Take 100 mg by mouth 2 (two) times daily.          Marland Kitchen venlafaxine (EFFEXOR) 75 MG tablet   Oral   Take 75 mg by mouth daily.          Triage BP 152/108  Pulse 97  Temp(Src) 98.3 F (36.8 C) (Oral)  Resp 18  Ht 5\' 5"  (1.651 m)  Wt 190 lb (86.183 kg)  BMI 31.62 kg/m2  SpO2 97% Physical Exam  Nursing note and vitals reviewed. Constitutional: She appears well-developed and well-nourished. No distress.  HENT:  Head: Normocephalic and atraumatic.  Right Ear: External ear normal.  Left Ear: External ear normal.  Mouth/Throat: Mucous membranes are dry.  Eyes: Conjunctivae are normal. Right eye exhibits no  discharge. Left eye exhibits no discharge. No scleral icterus.  Neck: Neck supple. No tracheal deviation present.  Cardiovascular: Normal rate, regular rhythm and intact distal pulses.   Pulmonary/Chest: Effort normal and breath sounds normal. No stridor. No respiratory distress. She has no wheezes. She has no rales.  Abdominal: Soft. Bowel sounds are normal. She exhibits no distension. There is generalized tenderness. There is no rigidity, no rebound and no guarding. No hernia.  Musculoskeletal: She exhibits no edema and no tenderness.  Neurological: She is alert. She has normal strength. No cranial nerve deficit (no facial droop, extraocular movements intact, no slurred speech) or sensory deficit. She exhibits normal muscle tone. She displays no seizure activity. Coordination normal.  Skin: Skin is warm and dry. No rash noted.  Psychiatric: She has a normal mood and affect.    ED Course  Procedures (including critical care time) DIAGNOSTIC STUDIES: Oxygen Saturation is 97% on room air, normal by my interpretation.    COORDINATION OF CARE: 9:16 AM- Will order IV fluids, Zofran, Dilauid, and get labs: CBC with diff, CMP, blood pase, UA, urine pregnancy test.Patient informed of current plan for treatment and evaluation and agrees with plan at this time.   10:21 AM - Labs are unremarkable and results were discussed with patient. Patient improve with fluids, Zofran and pain medicines. Repeat exam.  No abdominal tenderness.  Patient was reasonably evaluated and stable for discharge. Patient verbalizes understanding and agrees to plan. Labs Review Labs Reviewed  URINALYSIS, ROUTINE W REFLEX MICROSCOPIC - Abnormal; Notable for the following:    Specific Gravity, Urine >1.030 (*)    All other components within normal limits  CBC WITH DIFFERENTIAL  COMPREHENSIVE METABOLIC PANEL  LIPASE, BLOOD  PREGNANCY, URINE      MDM   Final diagnoses:  Vomiting and diarrhea    Suspect viral GE.   Doubt appendicitis, cholecystitis, obstructions, diverticulitis.  Pt improved with treatment.  Comfortable with discharge at this time.  I personally performed the services described in this documentation, which was scribed in my presence.  The recorded information has been reviewed and is accurate.   Kathalene Frames, MD 04/06/13 [id number]

## 2013-04-06 NOTE — ED Notes (Signed)
Pt c/o abd pain with n/v/d and headache since Friday.  Was put on phenergan yesterday.

## 2013-04-06 NOTE — ED Notes (Signed)
Pt given Sprite to drink.

## 2013-04-06 NOTE — Discharge Instructions (Signed)
Nausea and Vomiting °Nausea means you feel sick to your stomach. Throwing up (vomiting) is a reflex where stomach contents come out of your mouth. °HOME CARE  °· Take medicine as told by your doctor. °· Do not force yourself to eat. However, you do need to drink fluids. °· If you feel like eating, eat a normal diet as told by your doctor. °· Eat rice, wheat, potatoes, bread, lean meats, yogurt, fruits, and vegetables. °· Avoid high-fat foods. °· Drink enough fluids to keep your pee (urine) clear or pale yellow. °· Ask your doctor how to replace body fluid losses (rehydrate). Signs of body fluid loss (dehydration) include: °· Feeling very thirsty. °· Dry lips and mouth. °· Feeling dizzy. °· Dark pee. °· Peeing less than normal. °· Feeling confused. °· Fast breathing or heart rate. °GET HELP RIGHT AWAY IF:  °· You have blood in your throw up. °· You have black or bloody poop (stool). °· You have a bad headache or stiff neck. °· You feel confused. °· You have bad belly (abdominal) pain. °· You have chest pain or trouble breathing. °· You do not pee at least once every 8 hours. °· You have cold, clammy skin. °· You keep throwing up after 24 to 48 hours. °· You have a fever. °MAKE SURE YOU:  °· Understand these instructions. °· Will watch your condition. °· Will get help right away if you are not doing well or get worse. °Document Released: 06/12/2007 Document Revised: 03/18/2011 Document Reviewed: 05/25/2010 °ExitCare® Patient Information ©2014 [employer]. ° °

## 2013-04-06 NOTE — ED Notes (Signed)
NVD, seen here earlier with same sx, says she began to feel worse and returned . Abd pain

## 2013-04-06 NOTE — Discharge Instructions (Signed)
°Emergency Department Resource Guide °1) Find a Doctor and Pay Out of Pocket °Although you won't have to find out who is covered by your insurance plan, it is a good idea to ask around and get recommendations. You will then need to call the office and see if the doctor you have chosen will accept you as a new patient and what types of options they offer for patients who are self-pay. Some doctors offer discounts or will set up payment plans for their patients who do not have insurance, but you will need to ask so you aren't surprised when you get to your appointment. ° °2) Contact Your Local Health Department °Not all health departments have doctors that can see patients for sick visits, but many do, so it is worth a call to see if yours does. If you don't know where your local health department is, you can check in your phone book. The CDC also has a tool to help you locate your state's health department, and many state websites also have listings of all of their local health departments. ° °3) Find a Walk-in Clinic °If your illness is not likely to be very severe or complicated, you may want to try a walk in clinic. These are popping up all over the country in pharmacies, drugstores, and shopping centers. They're usually staffed by nurse practitioners or physician assistants that have been trained to treat common illnesses and complaints. They're usually fairly quick and inexpensive. However, if you have serious medical issues or chronic medical problems, these are probably not your best option. ° °No Primary Care Doctor: °- Call [facility] at  832-8000 - they can help you locate a primary care doctor that  accepts your insurance, provides certain services, etc. °- Physician Referral Service- 1-800-533-3463 ° °Chronic Pain Problems: °Organization         Address  Phone   Notes  °[facility]  (336) [phone] Patients need to be referred by their primary care doctor.  ° °Medication  Assistance: °Organization         Address  Phone   Notes  °[place] [facility] [address] °[place], [place] [address] (336) 641-8030 --Must be a resident of [place] °-- Must have NO insurance coverage whatsoever (no Medicaid/ Medicare, etc.) °-- The pt. MUST have a primary care doctor that directs their care regularly and follows them in the community °  °[facility]  (866) 331-1348   °[employer]  (888) 892-1162   ° °Agencies that provide inexpensive medical care: °Organization         Address  Phone   Notes  °[facility]  (336) 832-8035   °[facility]    (336) 832-7272   °[facility] [address] °[place], [place] [address] (336) 832-4777   °[facility] [address], °[place] (336) 271-4999   °[facility]    (336) 373-0678   °[facility]    (336) 272-1050   °[facility] ° [address], [place] Phone:  (336) 832-4444, Fax:  (336) 832-4440 Hours of Operation:  9 am - 6 pm, M-F.  Also accepts Medicaid/Medicare and self-pay.  °[facility] ° [address], [place] Phone: (336) 832-3150, Fax: (336) [phone]. Hours of Operation:  8:30 am - 5:30 pm, M-F.  Also accepts Medicaid and self-pay.  °[facility] [place] [address], [place] Phone: (336) 878-6027   °[facility] [address], [place], [place] (336)723-1848, Ext. 123 Mondays & Thursdays: 7-9 AM.  First 15 patients are seen on a first come, first serve basis. °  ° °Medicaid-accepting [place] Providers: ° °Organization         Address  Phone   Notes  °[facility] [address], [place] (336) 641-2100 Also accepts self-pay patients.  °[facility] [address], [place] ° (336) 856-9996   °[facility] [address], [place]  (336) 288-8857   °[facility] [address] [place] [address], [place] (336) 299-7000   °Veita Bland [address], [place]  ° (336) 373-1557 Only accepts [place] Access Medicaid patients after they have their name applied to their card.  ° °Self-Pay (no insurance) in [place]: ° °Organization         Address  Phone   Notes  °Sickle Cell Patients, [facility] [address], [place] (336) 832-1970   °[facility] [address], [place] (336) 832-4400   °[facility] [place] ° [address], [place] (336) 992-4800   °[facility]/Dr. Osei-Bonsu ° [address] [place] [address], [place] or [address], [place] (336) [phone] Phone number for both [place] and [place] locations is the same.  °[facility] [address], [place] (336) 299-0000   °[facility] [address] [place] [address], [place] or [address] (336) 852-7530 °(336) 878-2260   °[facility] [address], [place] (336) 350-1642, phone; (336) 294-5005, fax Sees patients 1st and 3rd Saturday of every month.  Must not qualify for public or private insurance (i.e. Medicaid, Medicare, [place] Health Choice, Veterans' Benefits) • Household income should be no more than 200% of the poverty level •The clinic cannot treat you if you are pregnant or think you are pregnant • Sexually transmitted diseases are not treated at the clinic.  ° ° °Dental Care: °Organization         Address  Phone  Notes  °[place] [facility] [address], [place] (336) 641-6152 Accepts children up to age 21 who are enrolled in Medicaid or [place] Health Choice; pregnant women with a Medicaid card; and children who have applied for Medicaid or [place] Health Choice, but were declined, whose parents can pay a reduced fee at time of service.  °[place]  [facility] [place]  [address], [place] (336) 641-7733 Accepts children up to age 21 who are enrolled in Medicaid or  Health Choice; pregnant women with a Medicaid card; and children who have applied for Medicaid or  Health Choice, but were declined, whose parents can pay a reduced fee at time of service.  °[facility] ° [address], [place] (336) 641-4533 Patients are seen by appointment only. Walk-ins are not accepted. [facility] will see patients 18 years of age and older. °Monday - Tuesday (8am-5pm) °Most Wednesdays (8:30-5pm) °$30 per visit, cash only  °[facility] ° [address], [place] (336) 641-4533 Patients are seen by appointment only. Walk-ins are not accepted. [facility] will see patients 18 years of age and older. °One   Wednesday Evening (Monthly: Volunteer Based).  $30 per visit, cash only  °[facility]  (919) [phone] for adults; Children under age 4, call Graduate Pediatric Dentistry at ([phone]) [phone]. Children aged 4-14, please call ([phone]) [phone] to request a pediatric application. ° Dental services are provided in all areas of dental care including fillings, crowns and bridges, complete and partial dentures, implants, gum treatment, root canals, and extractions. Preventive care is also provided. Treatment is provided to both adults and children. °Patients are selected via a lottery and there is often a waiting list. °  °[facility] [address], °[place] ° (336) 763-8833 [email or web address] °  °[facility] [address], [place], [place] (336)723-1848, Ext. 123 Second and Fourth Thursday of each month, opens at 6:30 AM; Clinic ends at 9 AM.  Patients are seen on a first-come first-served basis, and a limited number are seen during each clinic.  ° °[facility] ° [address], [place], [place] (336) 723-7904    Eligibility Requirements °You must have lived in [place], [place], or [place] counties for at least the last three months. °  You cannot be eligible for state or federal sponsored healthcare insurance, including Veterans Administration, Medicaid, or Medicare. °  You generally cannot be eligible for healthcare insurance through your employer.  °  How to apply: °Eligibility screenings are held every Tuesday and Wednesday afternoon from 1:00 pm until 4:00 pm. You do not need an appointment for the interview!  °[facility] [address], [place], [place] 336-631-2330   °[facility]  336-342-8273   °[facility]  336-703-3100   °[facility]  336-570-6415   ° °Behavioral Health Resources in the Community: °Intensive Outpatient Programs °Organization         Address  Phone  Notes  °[facility] [address], [place], [place] 336-878-6098   °[facility] [address], [place], [place] 336-832-9800   °[facility] [address], [place], [place] ° 336-882-2125   °[facility] [address],  °[place], [place] 1-800-853-5163 or 336-641-4981   °Substance Abuse Resources °Organization         Address  Phone  Notes  °[facility]  336-882-2125   °[facility]  336-784-9470   °[facility]  336-285-9073   °[facility]  336-845-3988   °Residential & Outpatient Substance Abuse Program  1-800-659-3381   °Psychological Services °Organization         Address  Phone  Notes  °[facility]  336- 832-9600   °[facility]  336- 378-7881   °[facility] [address], [place] 1-800-853-5163 or 336-641-4981   ° °Mobile Crisis Teams °Organization         Address  Phone  Notes  °[facility], Mobile Crisis Care Unit  1-877-626-1772   °Assertive °[facility] ° [address]  [place], [place] 336-834-9664   °Sharon DeEsch [address] °[place] 336-554-5454   ° °Self-Help/Support Groups °Organization         Address  Phone             Notes  °[facility] [place] - variety of support groups  336- 373-1402 Call for more information  °Narcotics Anonymous (NA), [facility] [address], °[place]   2 meetings at this location  ° °  Residential Treatment Programs Organization         Address  Phone  Notes  [facility] [address],    [place]  1-(667) 399-0233   [facility]  [address], [place] [address], [place], [place]   [place], [place] [phone] Admissions: 8am-3pm M-F  [facility] [place] [address],    [place], [place] 073-710-6269   [facility] [address] [place], [place], [place]   [facility] [address],  [place], [place]   [facility] - Intensive Outpatient [address], [place], [place]   [facility] [address]  [place], [place] 1-(832) 093-7211 or 825-464-9932   [facility] [address] [place], [place] Accepts Medicaid  [facility] [address]  [place] 1-(240)686-4310 Substance Abuse/Addiction Treatment   [facility] Organization         Address  Phone  Notes  [facility]  (724)720-1780   Domenic Schwab, PhD [address] [place], [place]   762-451-0708 or (323)241-6379   [place], [place] 747-714-8090   [facility] [address], [place], [place] (618)214-3245 Insurance/Medicaid/sponsorship through [facility] [address]                                    [place], [place] (234) [phone] [place] [address], [place] [phone]    Dr. Adele Schilder  ([phone]   [facility] Dept. 1) [address], [place] 2) [place] 3)  [address], [place] [phone] (250) [phone]  [phone]   [place] (207)745-7974 or 332-316-3779 (After Hours)       Take the prescription as directed.  Increase your fluid intake (ie:  Gatoraide) for the next few days, as discussed.  Eat a bland diet and advance to your regular diet slowly as you can tolerate it.   Avoid full strength juices, as well as milk and milk products until your diarrhea has resolved.   Call your regular medical doctor tomorrow to schedule a follow up appointment within the next 1 to 2 days.  Return to the Emergency Department immediately if not improving (or even worsening) despite taking the medicines as prescribed, any black or bloody stool or vomit, if you develop a fever over "101," or for any other concerns.

## 2013-04-07 ENCOUNTER — Encounter (HOSPITAL_COMMUNITY): Payer: Self-pay | Admitting: Emergency Medicine

## 2013-04-07 ENCOUNTER — Emergency Department (HOSPITAL_COMMUNITY): Payer: Medicare Other

## 2013-04-07 ENCOUNTER — Emergency Department (HOSPITAL_COMMUNITY)
Admission: EM | Admit: 2013-04-07 | Discharge: 2013-04-07 | Disposition: A | Discharge status: Home / Self Care | Payer: Medicare Other | Attending: Emergency Medicine | Admitting: Emergency Medicine

## 2013-04-07 DIAGNOSIS — F411 Generalized anxiety disorder: Secondary | ICD-10-CM | POA: Insufficient documentation

## 2013-04-07 DIAGNOSIS — K5289 Other specified noninfective gastroenteritis and colitis: Secondary | ICD-10-CM | POA: Insufficient documentation

## 2013-04-07 DIAGNOSIS — Z791 Long term (current) use of non-steroidal anti-inflammatories (NSAID): Secondary | ICD-10-CM | POA: Insufficient documentation

## 2013-04-07 DIAGNOSIS — K529 Noninfective gastroenteritis and colitis, unspecified: Secondary | ICD-10-CM

## 2013-04-07 DIAGNOSIS — F3289 Other specified depressive episodes: Secondary | ICD-10-CM | POA: Insufficient documentation

## 2013-04-07 DIAGNOSIS — R109 Unspecified abdominal pain: Secondary | ICD-10-CM

## 2013-04-07 DIAGNOSIS — G8929 Other chronic pain: Secondary | ICD-10-CM | POA: Insufficient documentation

## 2013-04-07 DIAGNOSIS — Z9889 Other specified postprocedural states: Secondary | ICD-10-CM | POA: Insufficient documentation

## 2013-04-07 DIAGNOSIS — M549 Dorsalgia, unspecified: Secondary | ICD-10-CM | POA: Insufficient documentation

## 2013-04-07 DIAGNOSIS — R059 Cough, unspecified: Secondary | ICD-10-CM | POA: Insufficient documentation

## 2013-04-07 DIAGNOSIS — Z79899 Other long term (current) drug therapy: Secondary | ICD-10-CM | POA: Insufficient documentation

## 2013-04-07 DIAGNOSIS — Z9089 Acquired absence of other organs: Secondary | ICD-10-CM | POA: Insufficient documentation

## 2013-04-07 DIAGNOSIS — J45909 Unspecified asthma, uncomplicated: Secondary | ICD-10-CM | POA: Insufficient documentation

## 2013-04-07 DIAGNOSIS — K219 Gastro-esophageal reflux disease without esophagitis: Secondary | ICD-10-CM | POA: Insufficient documentation

## 2013-04-07 DIAGNOSIS — F17200 Nicotine dependence, unspecified, uncomplicated: Secondary | ICD-10-CM | POA: Insufficient documentation

## 2013-04-07 DIAGNOSIS — R05 Cough: Secondary | ICD-10-CM | POA: Insufficient documentation

## 2013-04-07 DIAGNOSIS — Z933 Colostomy status: Secondary | ICD-10-CM | POA: Insufficient documentation

## 2013-04-07 DIAGNOSIS — G35 Multiple sclerosis: Secondary | ICD-10-CM | POA: Insufficient documentation

## 2013-04-07 DIAGNOSIS — Z975 Presence of (intrauterine) contraceptive device: Secondary | ICD-10-CM | POA: Insufficient documentation

## 2013-04-07 DIAGNOSIS — F329 Major depressive disorder, single episode, unspecified: Secondary | ICD-10-CM | POA: Insufficient documentation

## 2013-04-07 DIAGNOSIS — G43909 Migraine, unspecified, not intractable, without status migrainosus: Secondary | ICD-10-CM | POA: Insufficient documentation

## 2013-04-07 LAB — I-STAT CG4 LACTIC ACID, ED: Lactic Acid, Venous: 2.83 mmol/L — ABNORMAL HIGH (ref 0.5–2.2)

## 2013-04-07 LAB — I-STAT CHEM 8, ED
BUN: 6 mg/dL (ref 6–23)
Calcium, Ion: 1.17 mmol/L (ref 1.12–1.23)
Chloride: 102 mEq/L (ref 96–112)
Creatinine, Ser: 0.7 mg/dL (ref 0.50–1.10)
GLUCOSE: 90 mg/dL (ref 70–99)
HCT: 51 % — ABNORMAL HIGH (ref 36.0–46.0)
HEMOGLOBIN: 17.3 g/dL — AB (ref 12.0–15.0)
POTASSIUM: 3.9 meq/L (ref 3.7–5.3)
SODIUM: 143 meq/L (ref 137–147)
TCO2: 27 mmol/L (ref 0–100)

## 2013-04-07 LAB — POC OCCULT BLOOD, ED: Fecal Occult Bld: NEGATIVE

## 2013-04-07 LAB — OCCULT BLOOD X 1 CARD TO LAB, STOOL: Fecal Occult Bld: NEGATIVE

## 2013-04-07 LAB — CLOSTRIDIUM DIFFICILE BY PCR: CDIFFPCR: NEGATIVE

## 2013-04-07 MED ORDER — HYDROMORPHONE HCL PF 1 MG/ML IJ SOLN
1.0000 mg | Freq: Once | INTRAMUSCULAR | Status: AC
Start: 2013-04-07 — End: 2013-04-07
  Administered 2013-04-07: 1 mg via INTRAVENOUS
  Filled 2013-04-07: qty 1

## 2013-04-07 MED ORDER — ONDANSETRON HCL 4 MG/2ML IJ SOLN
4.0000 mg | Freq: Once | INTRAMUSCULAR | Status: AC
  Administered 2013-04-07: 4 mg via INTRAVENOUS
  Filled 2013-04-07: qty 2

## 2013-04-07 MED ORDER — IOHEXOL 300 MG/ML  SOLN
50.0000 mL | Freq: Once | INTRAMUSCULAR | Status: AC | PRN
  Administered 2013-04-07: 50 mL via ORAL

## 2013-04-07 MED ORDER — IOHEXOL 300 MG/ML  SOLN
100.0000 mL | Freq: Once | INTRAMUSCULAR | Status: AC | PRN
  Administered 2013-04-07: 100 mL via INTRAVENOUS

## 2013-04-07 MED ORDER — SODIUM CHLORIDE 0.9 % IV BOLUS (SEPSIS)
1000.0000 mL | Freq: Once | INTRAVENOUS | Status: AC
  Administered 2013-04-07: 1000 mL via INTRAVENOUS

## 2013-04-07 NOTE — ED Notes (Signed)
I-stat Lactic Acid result 2.83. EDP aware.

## 2013-04-07 NOTE — Discharge Instructions (Signed)

## 2013-04-07 NOTE — ED Notes (Signed)
Continued abdominal pain since initial visit on Friday, per pt.

## 2013-04-07 NOTE — ED Provider Notes (Signed)
CSN: [id number]     Arrival date & time 04/07/13  4259 History   Frst MD Initiated Contact with Patient 04/07/13 [phone]   This chart was scribed for Sharyon Cable, MD by Terressa Koyanagi, ED Scribe. This patient was seen in room APA04/APA04 and the patient's care was started at 9:30 AM.  PCP: Celedonio Savage, MD  Chief Complaint  Patient presents with  . Abdominal Pain   Patient is a 33 y.o. female presenting with abdominal pain. The history is provided by the patient. No language interpreter was used.  Abdominal Pain Pain location:  Suprapubic Duration:  5 days Progression:  Worsening Context: previous surgery (bowel obstruction) and sick contacts (Aid was ill last week for 2-3 days)   Context: not recent travel   Associated symptoms: cough, diarrhea, fever, hematochezia, nausea and vomiting   Associated symptoms: no dysuria and no vaginal discharge     HPI Comments: Emily [name] is a 33 y.o. female, with a history of MS, perforated bowel, GERD, and HA, who presents to the Emergency Department complaining of intermittent, worsening umbilical abd pain onset 5 days ago. Pt also complains of associated vomiting, cough, diarrhea, hematochezia, nausea, subjective fever, imbalance, and HA onset 5 days ago. Pt reports she is having bowel movements every 20-30 minutes. Pt denies hematemesis, vaginal discharge, dysuria. Pt was evaluated at the ED for the same yesterday. Pt denies any recent traveling. Pt reports she had three surgeries for bowel obstruction since 2008. Pt denies being on any steroids currently. Pt is a current smoker.   Sick Contact: Aid was sick last week for 2-3 days.               Past Medical History  Diagnosis Date  . Headache(784.0)     migraines  . Multiple sclerosis   . Asthma as a child  . GERD (gastroesophageal reflux disease)   . Urinary urgency   . Anxiety   . Perforated bowel 2009  . Migraines   . Depression   . Chronic back pain    Past Surgical  History  Procedure Laterality Date  . Extremity cyst excision  1994    right leg  . 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 Colostomy closure  04/2007  . Scar revision  01/21/2011    Procedure: SCAR REVISION;  Surgeon: Hermelinda Dellen;  Location: [facility];  Service: Plastics;  Laterality: N/A;  exploration of scar of abdomen and repair of defect  . Hernia repair    . Abdominal surgery    . Appendectomy     No family history on file. History  Substance Use Topics  . Smoking status: Current Some Day Smoker    Types: Cigarettes  . Smokeless tobacco: Never Used     Comment: quit smoking 10 days ago  . Alcohol Use: Yes     Comment: Occ   OB History   Grav Para Term Preterm Abortions TAB SAB Ect Mult Living                 Review of Systems  Constitutional: Positive for fever.  Respiratory: Positive for cough.   Gastrointestinal: Positive for nausea, vomiting, abdominal pain, diarrhea, blood in stool and hematochezia.  Genitourinary: Negative for dysuria and vaginal discharge.  Neurological:       Loss of balance  All other systems reviewed and are negative.      Allergies  Amitriptyline; Baclofen; Cymbalta; Gabapentin; Other; Alprazolam;  Vicodin; and Adhesive  Home Medications   Current Outpatient Rx  Name  Route  Sig  Dispense  Refill  . clonazePAM (KLONOPIN) 1 MG tablet   Oral   Take 1 mg by mouth 4 (four) times daily as needed for anxiety.         . dicyclomine (BENTYL) 20 MG tablet   Oral   Take 1 tablet (20 mg total) by mouth every 6 (six) hours as needed for spasms (abdominal cramping).   15 tablet   0   . divalproex (DEPAKOTE ER) 500 MG 24 hr tablet   Oral   Take 500 mg by mouth daily. For depression         . etodolac (LODINE) 400 MG tablet   Oral   Take 400 mg by mouth 2 (two) times daily.         . Fingolimod HCl (GILENYA) 0.5 MG CAPS   Oral   Take 1 capsule by mouth daily.         . hydrOXYzine  (ATARAX/VISTARIL) 10 MG tablet   Oral   Take 10 mg by mouth daily as needed for itching.         . loratadine (CLARITIN) 10 MG tablet   Oral   Take 10 mg by mouth daily.           . meloxicam (MOBIC) 7.5 MG tablet   Oral   Take 1 tablet (7.5 mg total) by mouth daily.   30 tablet   0   . omeprazole (PRILOSEC) 40 MG capsule   Oral   Take 40 mg by mouth daily.          . ondansetron (ZOFRAN) 4 MG tablet   Oral   Take 4 mg by mouth every 8 (eight) hours as needed for nausea or vomiting.          Marland Kitchen oxyCODONE (ROXICODONE) 15 MG immediate release tablet   Oral   Take 15 mg by mouth every 4 (four) hours as needed for pain.          . potassium chloride (K-DUR) 10 MEQ tablet   Oral   Take 10 mEq by mouth 2 (two) times daily.         . pregabalin (LYRICA) 200 MG capsule   Oral   Take 200 mg by mouth at bedtime.          . prochlorperazine (COMPAZINE) 10 MG tablet   Oral   Take 1 tablet (10 mg total) by mouth every 6 (six) hours as needed for nausea or vomiting.   12 tablet   0   . ranitidine (ZANTAC) 300 MG capsule   Oral   Take 300 mg by mouth every evening.           . topiramate (TOPAMAX) 100 MG tablet   Oral   Take 100 mg by mouth 2 (two) times daily.          Marland Kitchen venlafaxine (EFFEXOR) 75 MG tablet   Oral   Take 75 mg by mouth daily.         Marland Kitchen EPINEPHrine (EPIPEN) 0.3 mg/0.3 mL SOAJ injection   Intramuscular   Inject 0.3 mg into the muscle once.         Marland Kitchen levonorgestrel (MIRENA) 20 MCG/24HR IUD   Intrauterine   1 each by Intrauterine route once.         . SUMAtriptan (IMITREX) 6 MG/0.5ML SOLN injection   Subcutaneous   Inject  6 mg into the skin every 2 (two) hours as needed for migraine or headache. May repeat in 2 hours if headache persists or recurs.          Triage Vitals: BP 159/94  Pulse 105  Temp(Src) 97.9 F (36.6 C) (Oral)  Resp 16  Ht 5\' 5"  (1.651 m)  Wt 190 lb (86.183 kg)  BMI 31.62 kg/m2  SpO2 100% Physical Exam   Nursing note and vitals reviewed.  CONSTITUTIONAL: Well developed/well nourished HEAD: Normocephalic/atraumatic EYES: EOMI/PERRL, no scleral icterus  ENMT: Mucous membranes moist NECK: supple no meningeal signs SPINE:entire spine nontender CV: S1/S2 noted, no murmurs/rubs/gallops noted LUNGS: Lungs are clear to auscultation bilaterally, no apparent distress ABDOMEN: soft, diffused moderate tenderness, no rebound or guarding GU:no cva tenderness RECTAL: no blood or melena noted, chaperone present NEURO: Pt is awake/alert, moves all extremitiesx4, no focal extremity weakness noted EXTREMITIES: pulses normal, full ROM SKIN: warm, color normal, no erythema/indurationed/abscess noted abdominal wall or umbilicus.  There is no erythema/warmth or crepitus noted to abdominal wall or back PSYCH: no abnormalities of mood noted  ED Course  Procedures DIAGNOSTIC STUDIES: Oxygen Saturation is 100% on RA, normal by my interpretation.    COORDINATION OF CARE: 9:37 AM-Discussed treatment plan which includes CT, labs (including evaluating stool sample) with pt at bedside and pt agreed to plan.    Pt felt improved while in the ED BP 153/99  Pulse 80  Temp(Src) 98.4 F (36.9 C) (Oral)  Resp 18  Ht 5\' 5"  (1.651 m)  Wt 190 lb (86.183 kg)  BMI 31.62 kg/m2  SpO2 100% abd soft on repeat exam Denies any recent injection abdomen/back Incidental findings on CT not seen externally on patient's abd/back I suspect this gastroenteritis Discussed need for outpatient workup Labs Review Labs Reviewed  I-STAT CHEM 8, ED - Abnormal; Notable for the following:    Hemoglobin 17.3 (*)    HCT 51.0 (*)    All other components within normal limits  I-STAT CG4 LACTIC ACID, ED - Abnormal; Notable for the following:    Lactic Acid, Venous 2.83 (*)    All other components within normal limits  CLOSTRIDIUM DIFFICILE BY PCR  STOOL CULTURE  OCCULT BLOOD X 1 CARD TO LAB, STOOL  POC OCCULT BLOOD, ED   Imaging  Review Ct Abdomen Pelvis W Contrast  04/07/2013   CLINICAL DATA:  Umbilical region pain with nausea and vomiting  EXAM: CT ABDOMEN AND PELVIS WITH CONTRAST  TECHNIQUE: Multidetector CT imaging of the abdomen and pelvis was performed using the standard protocol following bolus administration of intravenous contrast. Oral contrast was also administered.  CONTRAST:  150mL OMNIPAQUE IOHEXOL 300 MG/ML  SOLN  COMPARISON:  None.  FINDINGS: Lung bases are clear.  There is fatty change in the liver. No focal liver lesions are identified. There is no biliary duct dilatation. Gallbladder wall is not appreciably thickened.  Spleen, pancreas, and adrenals appear normal. Kidneys bilaterally show fetal lobulation, an anatomic variant. There is no renal mass or hydronephrosis on either side. There is no renal or ureteral calculus on either side.  There is postoperative change in the periumbilical region. There is soft tissue thickening posterior to the paraspinous muscles in the lumbar spine in the fat consistent with panniculitis posteriorly. A well-defined mass this area is not seen. A small amount of air is seen in the lateral posterior abdominal wall, possibly due to previous injection.  In the pelvis, there is evidence of previous surgery involving the sigmoid  colon. There is no surrounding mesenteric inflammation in this area. There is no pelvic mass or fluid collection. Urinary bladder is midline with normal wall thickness. There is an intrauterine device positioned within the uterus. There is no pelvic mass or fluid collection. The appendix is absent.  There is no appreciable bowel obstruction. There is no free air or portal venous air.  There is no ascites, adenopathy, or abscess in the abdomen or pelvis. There is no evidence suggesting abdominal aortic aneurysm. There are no blastic or lytic bone lesions.  IMPRESSION: There is soft tissue thickening in the posterior fat, posterior to the lumbar spine and paraspinous  muscles. Question a degree of panniculitis in this region.  Small amount of air in the posterolateral abdominal wall region within the fat; question recent injection in this area.  There is scarring and thickening in the anterior abdominal wall in the periumbilical region. A degree of cellulitis in this area cannot be excluded on this study. There is no focal abscess in this region.  There is postoperative change in the region of the sigmoid colon on the left. Appendix is absent. There is no bowel obstruction or abscess. There is no appreciable mesenteric inflammation.  There is an intrauterine device within the uterus.  There is fatty liver.   Electronically Signed   By: Lowella Grip M.D.   On: 04/07/2013 11:51   Dg Abd Acute W/chest  04/06/2013   CLINICAL DATA:  Umbilical pain, history of prior colon surgery  EXAM: ACUTE ABDOMEN SERIES (ABDOMEN 2 VIEW & CHEST 1 VIEW)  COMPARISON:  None.  FINDINGS: There is no evidence of dilated bowel loops or free intraperitoneal air. No radiopaque calculi or other significant radiographic abnormality is seen. Heart size and mediastinal contours are within normal limits. Both lungs are clear. Mild scoliosis of the spine. IUD noted in the pelvis located in the midline.  IMPRESSION: No acute chest or abdominal finding by plain radiography   Electronically Signed   By: Daryll Brod M.D.   On: 04/06/2013 18:56      MDM   Final diagnoses:  Abdominal pain  Gastroenteritis    Nursing notes including past medical history and social history reviewed and considered in documentation Labs/vital reviewed and considered Previous records reviewed and considered   I personally performed the services described in this documentation, which was scribed in my presence. The recorded information has been reviewed and is accurate.      Sharyon Cable, MD 04/07/13 [phone]

## 2013-04-07 NOTE — ED Notes (Signed)
POC occult blood result negative.

## 2013-04-08 ENCOUNTER — Encounter (HOSPITAL_COMMUNITY): Payer: Self-pay | Admitting: Emergency Medicine

## 2013-04-08 ENCOUNTER — Other Ambulatory Visit: Payer: Self-pay

## 2013-04-08 ENCOUNTER — Emergency Department (HOSPITAL_COMMUNITY)
Admission: EM | Admit: 2013-04-08 | Discharge: 2013-04-08 | Disposition: A | Discharge status: Home / Self Care | Payer: Medicare Other | Attending: Emergency Medicine | Admitting: Emergency Medicine

## 2013-04-08 DIAGNOSIS — F3289 Other specified depressive episodes: Secondary | ICD-10-CM | POA: Insufficient documentation

## 2013-04-08 DIAGNOSIS — R109 Unspecified abdominal pain: Secondary | ICD-10-CM | POA: Insufficient documentation

## 2013-04-08 DIAGNOSIS — F17200 Nicotine dependence, unspecified, uncomplicated: Secondary | ICD-10-CM | POA: Insufficient documentation

## 2013-04-08 DIAGNOSIS — R112 Nausea with vomiting, unspecified: Secondary | ICD-10-CM

## 2013-04-08 DIAGNOSIS — Z9089 Acquired absence of other organs: Secondary | ICD-10-CM | POA: Insufficient documentation

## 2013-04-08 DIAGNOSIS — Z9889 Other specified postprocedural states: Secondary | ICD-10-CM | POA: Insufficient documentation

## 2013-04-08 DIAGNOSIS — Y929 Unspecified place or not applicable: Secondary | ICD-10-CM | POA: Insufficient documentation

## 2013-04-08 DIAGNOSIS — G43909 Migraine, unspecified, not intractable, without status migrainosus: Secondary | ICD-10-CM | POA: Insufficient documentation

## 2013-04-08 DIAGNOSIS — G479 Sleep disorder, unspecified: Secondary | ICD-10-CM | POA: Insufficient documentation

## 2013-04-08 DIAGNOSIS — F329 Major depressive disorder, single episode, unspecified: Secondary | ICD-10-CM | POA: Insufficient documentation

## 2013-04-08 DIAGNOSIS — Z791 Long term (current) use of non-steroidal anti-inflammatories (NSAID): Secondary | ICD-10-CM | POA: Insufficient documentation

## 2013-04-08 DIAGNOSIS — S0990XA Unspecified injury of head, initial encounter: Secondary | ICD-10-CM | POA: Insufficient documentation

## 2013-04-08 DIAGNOSIS — X58XXXA Exposure to other specified factors, initial encounter: Secondary | ICD-10-CM | POA: Insufficient documentation

## 2013-04-08 DIAGNOSIS — R197 Diarrhea, unspecified: Secondary | ICD-10-CM | POA: Insufficient documentation

## 2013-04-08 DIAGNOSIS — G8929 Other chronic pain: Secondary | ICD-10-CM | POA: Insufficient documentation

## 2013-04-08 DIAGNOSIS — Y939 Activity, unspecified: Secondary | ICD-10-CM | POA: Insufficient documentation

## 2013-04-08 DIAGNOSIS — K219 Gastro-esophageal reflux disease without esophagitis: Secondary | ICD-10-CM | POA: Insufficient documentation

## 2013-04-08 DIAGNOSIS — J45909 Unspecified asthma, uncomplicated: Secondary | ICD-10-CM | POA: Insufficient documentation

## 2013-04-08 DIAGNOSIS — F411 Generalized anxiety disorder: Secondary | ICD-10-CM | POA: Insufficient documentation

## 2013-04-08 DIAGNOSIS — Z79899 Other long term (current) drug therapy: Secondary | ICD-10-CM | POA: Insufficient documentation

## 2013-04-08 LAB — CBC
HCT: 45.3 % (ref 36.0–46.0)
Hemoglobin: 15 g/dL (ref 12.0–15.0)
MCH: 30.2 pg (ref 26.0–34.0)
MCHC: 33.1 g/dL (ref 30.0–36.0)
MCV: 91.3 fL (ref 78.0–100.0)
PLATELETS: 337 10*3/uL (ref 150–400)
RBC: 4.96 MIL/uL (ref 3.87–5.11)
RDW: 13.4 % (ref 11.5–15.5)
WBC: 7.1 10*3/uL (ref 4.0–10.5)

## 2013-04-08 LAB — BASIC METABOLIC PANEL
BUN: 6 mg/dL (ref 6–23)
CALCIUM: 9.4 mg/dL (ref 8.4–10.5)
CO2: 29 mEq/L (ref 19–32)
Chloride: 102 mEq/L (ref 96–112)
Creatinine, Ser: 0.67 mg/dL (ref 0.50–1.10)
Glucose, Bld: 98 mg/dL (ref 70–99)
Potassium: 4.3 mEq/L (ref 3.7–5.3)
Sodium: 142 mEq/L (ref 137–147)

## 2013-04-08 MED ORDER — PROMETHAZINE HCL 25 MG PO TABS: 25.0000 mg | ORAL_TABLET | Freq: Four times a day (QID) | ORAL | Status: DC | PRN

## 2013-04-08 MED ORDER — KETOROLAC TROMETHAMINE 30 MG/ML IJ SOLN
30.0000 mg | Freq: Once | INTRAMUSCULAR | Status: AC
  Administered 2013-04-08: 30 mg via INTRAVENOUS
  Filled 2013-04-08: qty 1

## 2013-04-08 MED ORDER — METOCLOPRAMIDE HCL 5 MG/ML IJ SOLN
10.0000 mg | Freq: Once | INTRAMUSCULAR | Status: AC
  Administered 2013-04-08: 10 mg via INTRAVENOUS
  Filled 2013-04-08: qty 2

## 2013-04-08 MED ORDER — ONDANSETRON HCL 4 MG/2ML IJ SOLN
4.0000 mg | Freq: Once | INTRAMUSCULAR | Status: AC
  Administered 2013-04-08: 4 mg via INTRAVENOUS
  Filled 2013-04-08: qty 2

## 2013-04-08 MED ORDER — SODIUM CHLORIDE 0.9 % IV BOLUS (SEPSIS)
1000.0000 mL | Freq: Once | INTRAVENOUS | Status: AC
  Administered 2013-04-08: 1000 mL via INTRAVENOUS

## 2013-04-08 MED ORDER — PROMETHAZINE HCL 25 MG RE SUPP: 25.0000 mg | Freq: Four times a day (QID) | RECTAL | Status: DC | PRN

## 2013-04-08 NOTE — ED Notes (Signed)
Pt c/o continued n/v/d with ?syncopal episode this am. Pt states she woke up on the floor with pain to left side of head. nad noted.

## 2013-04-08 NOTE — ED Provider Notes (Signed)
CSN: [id number]     Arrival date & time 04/08/13  2130 History   This chart was scribed for Emily Morn, MD by Allena Earing, ED Scribe. This patient was seen in room APA07/APA07 and the patient's care was started at 918 .    Chief Complaint  Patient presents with  . Loss of Consciousness      The history is provided by the patient. No language interpreter was used.     HPI Comments: Emily [name] is a 33 y.o. female with h/o of HA who presents to the Emergency Department complaining of recurrent, worsening n/v/d that began last Friday, pt was in the ED yesterday where she received IV and was told to return if anything new developed. Pt states that now her head and stomach are painful. Pt cannot keep fluids down and feel dehydrated. Pt reports that Zofran did not help stomach. Pt does not have menstrual cycle.  PCP Dr. Wenda Overland   Past Medical History  Diagnosis Date  . Headache(784.0)     migraines  . Multiple sclerosis   . Asthma as a child  . GERD (gastroesophageal reflux disease)   . Urinary urgency   . Anxiety   . Perforated bowel 2009  . Migraines   . Depression   . Chronic back pain   . Chronic pain    Past Surgical History  Procedure Laterality Date  . Extremity cyst excision  1994    right leg  . 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 Colostomy closure  04/2007  . Scar revision  01/21/2011    Procedure: SCAR REVISION;  Surgeon: Hermelinda Dellen;  Location: [facility];  Service: Plastics;  Laterality: N/A;  exploration of scar of abdomen and repair of defect  . Hernia repair    . Abdominal surgery    . Appendectomy     No family history on file. History  Substance Use Topics  . Smoking status: Current Some Day Smoker    Types: Cigarettes  . Smokeless tobacco: Never Used     Comment: quit smoking 10 days ago  . Alcohol Use: Yes     Comment: Occ   OB History   Grav Para Term Preterm Abortions TAB SAB Ect Mult Living                  Review of Systems  Constitutional: Negative for fever.  HENT: Negative for rhinorrhea, sneezing and sore throat.   Respiratory: Negative for cough.   Gastrointestinal: Positive for nausea, vomiting and diarrhea.  Psychiatric/Behavioral: Positive for sleep disturbance. Negative for confusion.    A complete 10 system review of systems was obtained and all systems are negative except as noted in the HPI and PMH.      Allergies  Amitriptyline; Baclofen; Cymbalta; Gabapentin; Other; Alprazolam; Vicodin; and Adhesive  Home Medications   Current Outpatient Rx  Name  Route  Sig  Dispense  Refill  . clonazePAM (KLONOPIN) 1 MG tablet   Oral   Take 1 mg by mouth 4 (four) times daily as needed for anxiety.         . dicyclomine (BENTYL) 20 MG tablet   Oral   Take 1 tablet (20 mg total) by mouth every 6 (six) hours as needed for spasms (abdominal cramping).   15 tablet   0   . divalproex (DEPAKOTE ER) 500 MG 24 hr tablet   Oral   Take 500 mg by  mouth daily. For depression         . EPINEPHrine (EPIPEN) 0.3 mg/0.3 mL SOAJ injection   Intramuscular   Inject 0.3 mg into the muscle once.         . etodolac (LODINE) 400 MG tablet   Oral   Take 400 mg by mouth 2 (two) times daily.         . Fingolimod HCl (GILENYA) 0.5 MG CAPS   Oral   Take 1 capsule by mouth daily.         . hydrOXYzine (ATARAX/VISTARIL) 10 MG tablet   Oral   Take 10 mg by mouth daily as needed for itching.         Marland Kitchen levonorgestrel (MIRENA) 20 MCG/24HR IUD   Intrauterine   1 each by Intrauterine route once.         . loratadine (CLARITIN) 10 MG tablet   Oral   Take 10 mg by mouth daily.           . meloxicam (MOBIC) 7.5 MG tablet   Oral   Take 1 tablet (7.5 mg total) by mouth daily.   30 tablet   0   . omeprazole (PRILOSEC) 40 MG capsule   Oral   Take 40 mg by mouth daily.          . ondansetron (ZOFRAN) 4 MG tablet   Oral   Take 4 mg by mouth every 8 (eight) hours as  needed for nausea or vomiting.          Marland Kitchen oxyCODONE (ROXICODONE) 15 MG immediate release tablet   Oral   Take 15 mg by mouth every 4 (four) hours as needed for pain.          . potassium chloride (K-DUR) 10 MEQ tablet   Oral   Take 10 mEq by mouth 2 (two) times daily.         . pregabalin (LYRICA) 200 MG capsule   Oral   Take 200 mg by mouth at bedtime.          . prochlorperazine (COMPAZINE) 10 MG tablet   Oral   Take 1 tablet (10 mg total) by mouth every 6 (six) hours as needed for nausea or vomiting.   12 tablet   0   . ranitidine (ZANTAC) 300 MG capsule   Oral   Take 300 mg by mouth every evening.           . SUMAtriptan (IMITREX) 6 MG/0.5ML SOLN injection   Subcutaneous   Inject 6 mg into the skin every 2 (two) hours as needed for migraine or headache. May repeat in 2 hours if headache persists or recurs.         . topiramate (TOPAMAX) 100 MG tablet   Oral   Take 100 mg by mouth 2 (two) times daily.          Marland Kitchen venlafaxine (EFFEXOR) 75 MG tablet   Oral   Take 75 mg by mouth daily.         . promethazine (PHENERGAN) 25 MG suppository   Rectal   Place 1 suppository (25 mg total) rectally every 6 (six) hours as needed for nausea or vomiting.   12 each   0   . promethazine (PHENERGAN) 25 MG tablet   Oral   Take 1 tablet (25 mg total) by mouth every 6 (six) hours as needed for nausea or vomiting.   15 tablet   0    BP  138/98  Pulse 75  Temp(Src) 98.2 F (36.8 C)  Resp 18  Ht 5\' 5"  (1.651 m)  Wt 190 lb (86.183 kg)  BMI 31.62 kg/m2  SpO2 98% Physical Exam  Nursing note and vitals reviewed. Constitutional: She is oriented to person, place, and time. She appears well-developed and well-nourished.  HENT:  Head: Normocephalic.  Eyes: Pupils are equal, round, and reactive to light.  Neck: Normal range of motion.  Cardiovascular: Normal rate, regular rhythm and normal heart sounds.   Pulmonary/Chest: Effort normal and breath sounds normal. She  has no wheezes.  Abdominal: There is no tenderness.  Musculoskeletal: Normal range of motion.  Neurological: She is alert and oriented to person, place, and time.    ED Course  Procedures (including critical care time)   DIAGNOSTIC STUDIES: Oxygen Saturation is 99% on RA, normal by my interpretation.    COORDINATION OF CARE:    9:21 AM-Discussed treatment plan which includes labs, cardiac monitoring, EKG, medication with pt at bedside and pt agreed to plan.      Labs Review Labs Reviewed  CBC  BASIC METABOLIC PANEL   Imaging Review I personally reviewed the imaging tests through PACS system I reviewed available ER/hospitalization records through the EMR    EKG Interpretation   Date/Time:  Thursday April 08 2013 09:10:25 EDT Ventricular Rate:  88 PR Interval:  172 QRS Duration: 82 QT Interval:  360 QTC Calculation: 435 R Axis:   65 Text Interpretation:  Normal sinus rhythm Normal ECG Confirmed by Gwyn Hieronymus   MD, Tanessa Tidd ([id number]) on 04/08/2013 9:19:56 AM      MDM   Final diagnoses:  Nausea vomiting and diarrhea   10:36 AM Patient feels much better at this time.  Discharge home in good condition.  Likely viral gastroenteritis.  Minor head injury.  No indication for advanced imaging  I personally performed the services described in this documentation, which was scribed in my presence. The recorded information has been reviewed and is accurate.       Emily Morn, MD 04/08/13 1036

## 2013-04-09 ENCOUNTER — Emergency Department (HOSPITAL_COMMUNITY)
Admission: EM | Admit: 2013-04-09 | Discharge: 2013-04-09 | Disposition: A | Discharge status: Home / Self Care | Payer: Medicare Other | Attending: Emergency Medicine | Admitting: Emergency Medicine

## 2013-04-09 ENCOUNTER — Encounter (HOSPITAL_COMMUNITY): Payer: Self-pay | Admitting: Emergency Medicine

## 2013-04-09 ENCOUNTER — Emergency Department (HOSPITAL_COMMUNITY): Payer: Medicare Other

## 2013-04-09 DIAGNOSIS — K219 Gastro-esophageal reflux disease without esophagitis: Secondary | ICD-10-CM | POA: Insufficient documentation

## 2013-04-09 DIAGNOSIS — R112 Nausea with vomiting, unspecified: Secondary | ICD-10-CM | POA: Insufficient documentation

## 2013-04-09 DIAGNOSIS — R55 Syncope and collapse: Secondary | ICD-10-CM | POA: Insufficient documentation

## 2013-04-09 DIAGNOSIS — F411 Generalized anxiety disorder: Secondary | ICD-10-CM | POA: Insufficient documentation

## 2013-04-09 DIAGNOSIS — R6883 Chills (without fever): Secondary | ICD-10-CM | POA: Insufficient documentation

## 2013-04-09 DIAGNOSIS — F329 Major depressive disorder, single episode, unspecified: Secondary | ICD-10-CM | POA: Insufficient documentation

## 2013-04-09 DIAGNOSIS — G8929 Other chronic pain: Secondary | ICD-10-CM | POA: Insufficient documentation

## 2013-04-09 DIAGNOSIS — Y929 Unspecified place or not applicable: Secondary | ICD-10-CM | POA: Insufficient documentation

## 2013-04-09 DIAGNOSIS — G43909 Migraine, unspecified, not intractable, without status migrainosus: Secondary | ICD-10-CM | POA: Insufficient documentation

## 2013-04-09 DIAGNOSIS — R51 Headache: Secondary | ICD-10-CM

## 2013-04-09 DIAGNOSIS — R519 Headache, unspecified: Secondary | ICD-10-CM

## 2013-04-09 DIAGNOSIS — F17200 Nicotine dependence, unspecified, uncomplicated: Secondary | ICD-10-CM | POA: Insufficient documentation

## 2013-04-09 DIAGNOSIS — Z79899 Other long term (current) drug therapy: Secondary | ICD-10-CM | POA: Insufficient documentation

## 2013-04-09 DIAGNOSIS — S0990XA Unspecified injury of head, initial encounter: Secondary | ICD-10-CM | POA: Insufficient documentation

## 2013-04-09 DIAGNOSIS — Y939 Activity, unspecified: Secondary | ICD-10-CM | POA: Insufficient documentation

## 2013-04-09 DIAGNOSIS — F3289 Other specified depressive episodes: Secondary | ICD-10-CM | POA: Insufficient documentation

## 2013-04-09 DIAGNOSIS — J45909 Unspecified asthma, uncomplicated: Secondary | ICD-10-CM | POA: Insufficient documentation

## 2013-04-09 DIAGNOSIS — W1809XA Striking against other object with subsequent fall, initial encounter: Secondary | ICD-10-CM | POA: Insufficient documentation

## 2013-04-09 LAB — URINE MICROSCOPIC-ADD ON

## 2013-04-09 LAB — URINALYSIS, ROUTINE W REFLEX MICROSCOPIC
Bilirubin Urine: NEGATIVE
Glucose, UA: NEGATIVE mg/dL
KETONES UR: NEGATIVE mg/dL
LEUKOCYTES UA: NEGATIVE
Nitrite: NEGATIVE
PH: 6 (ref 5.0–8.0)
Protein, ur: NEGATIVE mg/dL
Specific Gravity, Urine: 1.025 (ref 1.005–1.030)
UROBILINOGEN UA: 0.2 mg/dL (ref 0.0–1.0)

## 2013-04-09 MED ORDER — DIPHENHYDRAMINE HCL 50 MG/ML IJ SOLN
50.0000 mg | Freq: Once | INTRAMUSCULAR | Status: AC
  Administered 2013-04-09: 50 mg via INTRAMUSCULAR
  Filled 2013-04-09: qty 1

## 2013-04-09 MED ORDER — METOCLOPRAMIDE HCL 5 MG/ML IJ SOLN
10.0000 mg | Freq: Once | INTRAMUSCULAR | Status: AC
  Administered 2013-04-09: 10 mg via INTRAMUSCULAR
  Filled 2013-04-09: qty 2

## 2013-04-09 MED ORDER — HYDROMORPHONE HCL PF 1 MG/ML IJ SOLN
1.0000 mg | Freq: Once | INTRAMUSCULAR | Status: AC
  Administered 2013-04-09: 1 mg via INTRAMUSCULAR
  Filled 2013-04-09: qty 1

## 2013-04-09 NOTE — ED Notes (Signed)
Pt co headache, abdominal pain, N/V/D. Seen for same yesterday, no improvement.

## 2013-04-09 NOTE — ED Notes (Signed)
Pt states she was here yesterday after fall, been taking percocet given without relief

## 2013-04-09 NOTE — Discharge Instructions (Signed)

## 2013-04-09 NOTE — ED Provider Notes (Signed)
CSN: [id number]     Arrival date & time 04/09/13  1134 History  This chart was scribed for Emily Jacobsen, MD by Zettie Pho, ED Scribe. This patient was seen in room APA04/APA04 and the patient's care was started at 3:11 PM.   Chief Complaint  Patient presents with  . Headache   The history is provided by the patient. No language interpreter was used.   HPI Comments: Emily [name] is a 33 y.o. Female with ahistory of headache/migraines, multiple sclerosis, and chronic pain who presents to the Emergency Department complaining of a constant, diffuse headache onset yesterday after she reports that she hit her head secondary to a brief syncopal episode that occurred yesterday. Patient reports some associated blurred vision with photophobia. Patient reports that her current symptoms do not feel similar to her past migraines and states that she has had more severe headaches in the past. She denies any exacerbating or alleviating factors. She reports associated nausea with multiple episodes of non-bilious, non-bloody emesis ( >10 episodes today per patient) and chills for the past few days and denies any recent changes. Patient was seen here yesterday for similar complaints and received a thorough work up that was unremarkable. She denies weakness, fever, leg pain, chest pain. Patient also has a history of asthma, GERD.   Past Medical History  Diagnosis Date  . Headache(784.0)     migraines  . Multiple sclerosis   . Asthma as a child  . GERD (gastroesophageal reflux disease)   . Urinary urgency   . Anxiety   . Perforated bowel 2009  . Migraines   . Depression   . Chronic back pain   . Chronic pain    Past Surgical History  Procedure Laterality Date  . Extremity cyst excision  1994    right leg  . 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 Colostomy closure  04/2007  . Scar revision  01/21/2011    Procedure: SCAR REVISION;  Surgeon: Hermelinda Dellen;  Location: [place];  Service: Plastics;  Laterality: N/A;  exploration of scar of abdomen and repair of defect  . Hernia repair    . Abdominal surgery    . Appendectomy     History reviewed. No pertinent family history. History  Substance Use Topics  . Smoking status: Current Some Day Smoker    Types: Cigarettes  . Smokeless tobacco: Never Used     Comment: quit smoking 10 days ago  . Alcohol Use: Yes     Comment: Occ   OB History   Grav Para Term Preterm Abortions TAB SAB Ect Mult Living                 Review of Systems  Constitutional: Positive for chills. Negative for fever.  Eyes: Positive for photophobia and visual disturbance.  Cardiovascular: Negative for chest pain.  Gastrointestinal: Positive for nausea and vomiting.  Neurological: Positive for headaches. Negative for weakness.  All other systems reviewed and are negative.      Allergies  Amitriptyline; Baclofen; Cymbalta; Gabapentin; Other; Alprazolam; Vicodin; and Adhesive  Home Medications   Current Outpatient Rx  Name  Route  Sig  Dispense  Refill  . clonazePAM (KLONOPIN) 1 MG tablet   Oral   Take 1 mg by mouth 4 (four) times daily as needed for anxiety.         . dicyclomine (BENTYL) 20 MG tablet   Oral   Take 1  tablet (20 mg total) by mouth every 6 (six) hours as needed for spasms (abdominal cramping).   15 tablet   0   . divalproex (DEPAKOTE ER) 500 MG 24 hr tablet   Oral   Take 500 mg by mouth daily. For depression         . etodolac (LODINE) 400 MG tablet   Oral   Take 400 mg by mouth 2 (two) times daily.         . Fingolimod HCl (GILENYA) 0.5 MG CAPS   Oral   Take 1 capsule by mouth daily.         . hydrOXYzine (ATARAX/VISTARIL) 10 MG tablet   Oral   Take 10 mg by mouth daily as needed for itching.         . loratadine (CLARITIN) 10 MG tablet   Oral   Take 10 mg by mouth daily.           . meloxicam (MOBIC) 7.5 MG tablet   Oral   Take 1 tablet (7.5 mg total) by mouth daily.    30 tablet   0   . omeprazole (PRILOSEC) 40 MG capsule   Oral   Take 40 mg by mouth daily.          . ondansetron (ZOFRAN) 4 MG tablet   Oral   Take 4 mg by mouth every 8 (eight) hours as needed for nausea or vomiting.          Marland Kitchen oxyCODONE (ROXICODONE) 15 MG immediate release tablet   Oral   Take 15 mg by mouth every 4 (four) hours as needed for pain.          . potassium chloride (K-DUR) 10 MEQ tablet   Oral   Take 10 mEq by mouth 2 (two) times daily.         . pregabalin (LYRICA) 100 MG capsule   Oral   Take 300 mg by mouth daily.         . prochlorperazine (COMPAZINE) 10 MG tablet   Oral   Take 1 tablet (10 mg total) by mouth every 6 (six) hours as needed for nausea or vomiting.   12 tablet   0   . promethazine (PHENERGAN) 25 MG tablet   Oral   Take 1 tablet (25 mg total) by mouth every 6 (six) hours as needed for nausea or vomiting.   15 tablet   0   . ranitidine (ZANTAC) 300 MG capsule   Oral   Take 300 mg by mouth every evening.           . SUMAtriptan (IMITREX) 6 MG/0.5ML SOLN injection   Subcutaneous   Inject 6 mg into the skin every 2 (two) hours as needed for migraine or headache. May repeat in 2 hours if headache persists or recurs.         . topiramate (TOPAMAX) 100 MG tablet   Oral   Take 100 mg by mouth 2 (two) times daily.          Marland Kitchen venlafaxine (EFFEXOR) 75 MG tablet   Oral   Take 75 mg by mouth daily.         Marland Kitchen EPINEPHrine (EPIPEN) 0.3 mg/0.3 mL SOAJ injection   Intramuscular   Inject 0.3 mg into the muscle once.         Marland Kitchen levonorgestrel (MIRENA) 20 MCG/24HR IUD   Intrauterine   1 each by Intrauterine route once.         Marland Kitchen  promethazine (PHENERGAN) 25 MG suppository   Rectal   Place 1 suppository (25 mg total) rectally every 6 (six) hours as needed for nausea or vomiting.   12 each   0    BP 168/111  Pulse 102  Temp(Src) 98.5 F (36.9 C) (Oral)  Resp 18  Ht 5\' 5"  (1.651 m)  Wt 190 lb (86.183 kg)  BMI 31.62  kg/m2  SpO2 99%  Physical Exam  Nursing note and vitals reviewed. Constitutional: She is oriented to person, place, and time. She appears well-developed and well-nourished. No distress.  HENT:  Head: Normocephalic and atraumatic.  Eyes: Conjunctivae are normal.  No nystagmus.   Neck: Normal range of motion. Neck supple.  Cardiovascular: Normal rate, regular rhythm and normal heart sounds.   Pulmonary/Chest: Effort normal and breath sounds normal. No respiratory distress.  Abdominal: She exhibits no distension.  Musculoskeletal: Normal range of motion.  Neurological: She is alert and oriented to person, place, and time. No cranial nerve deficit.  Normal gait without ataxia. Normal strength and sensation in bilateral upper and lower extremities.   Skin: Skin is warm and dry.  Psychiatric: She has a normal mood and affect. Her behavior is normal.    ED Course  Procedures (including critical care time)  DIAGNOSTIC STUDIES: Oxygen Saturation is 99% on room air, normal by my interpretation.    COORDINATION OF CARE: 3:16 PM- Will order a head CT and UA. Will order Reglan, Benadryl, and Dilaudid to manage symptoms. Discussed treatment plan with patient at bedside and patient verbalized agreement.     Labs Review Labs Reviewed  URINALYSIS, ROUTINE W REFLEX MICROSCOPIC - Abnormal; Notable for the following:    Hgb urine dipstick TRACE (*)    All other components within normal limits  URINE MICROSCOPIC-ADD ON   Imaging Review Ct Head Wo Contrast  04/09/2013   CLINICAL DATA:  Pain post trauma  EXAM: CT HEAD WITHOUT CONTRAST  TECHNIQUE: Contiguous axial images were obtained from the base of the skull through the vertex without intravenous contrast.  COMPARISON:  None.  FINDINGS: The ventricles are normal in size and configuration. The right lateral ventricle is somewhat larger than the left lateral ventricle, a probable anatomic variant. There is no mass, hemorrhage, extra-axial fluid  collection, or midline shift. Gray-white compartments are normal. Bony calvarium appears intact. The mastoid air cells are clear.  IMPRESSION: Prominence the right lateral ventricle compared to the left lateral ventricle is felt to represent an anatomic variant. Study otherwise unremarkable.   Electronically Signed   By: Lowella Grip M.D.   On: 04/09/2013 16:40     EKG Interpretation None      MDM   Final diagnoses:  None   Patient given medications and feels better. Neurological exam repeated prior to discharge in stable. No signs of dehydration at this time.     Emily Jacobsen, MD 04/09/13 [phone]

## 2013-04-09 NOTE — ED Notes (Signed)
Patient given discharge instruction, verbalized understand. Patient ambulatory out of the department.  

## 2013-04-11 LAB — STOOL CULTURE

## 2013-05-12 ENCOUNTER — Emergency Department (HOSPITAL_COMMUNITY)
Admission: EM | Admit: 2013-05-12 | Discharge: 2013-05-12 | Disposition: A | Discharge status: Home / Self Care | Payer: Medicare Other | Attending: Emergency Medicine | Admitting: Emergency Medicine

## 2013-05-12 ENCOUNTER — Encounter (HOSPITAL_COMMUNITY): Payer: Self-pay | Admitting: Emergency Medicine

## 2013-05-12 DIAGNOSIS — F17200 Nicotine dependence, unspecified, uncomplicated: Secondary | ICD-10-CM | POA: Insufficient documentation

## 2013-05-12 DIAGNOSIS — Z79899 Other long term (current) drug therapy: Secondary | ICD-10-CM | POA: Insufficient documentation

## 2013-05-12 DIAGNOSIS — Z791 Long term (current) use of non-steroidal anti-inflammatories (NSAID): Secondary | ICD-10-CM | POA: Insufficient documentation

## 2013-05-12 DIAGNOSIS — G35 Multiple sclerosis: Secondary | ICD-10-CM

## 2013-05-12 DIAGNOSIS — F3289 Other specified depressive episodes: Secondary | ICD-10-CM | POA: Insufficient documentation

## 2013-05-12 DIAGNOSIS — F329 Major depressive disorder, single episode, unspecified: Secondary | ICD-10-CM | POA: Insufficient documentation

## 2013-05-12 DIAGNOSIS — J45909 Unspecified asthma, uncomplicated: Secondary | ICD-10-CM | POA: Insufficient documentation

## 2013-05-12 DIAGNOSIS — F29 Unspecified psychosis not due to a substance or known physiological condition: Secondary | ICD-10-CM | POA: Insufficient documentation

## 2013-05-12 DIAGNOSIS — G8929 Other chronic pain: Secondary | ICD-10-CM

## 2013-05-12 DIAGNOSIS — G43909 Migraine, unspecified, not intractable, without status migrainosus: Secondary | ICD-10-CM

## 2013-05-12 DIAGNOSIS — K219 Gastro-esophageal reflux disease without esophagitis: Secondary | ICD-10-CM | POA: Insufficient documentation

## 2013-05-12 DIAGNOSIS — F411 Generalized anxiety disorder: Secondary | ICD-10-CM | POA: Insufficient documentation

## 2013-05-12 MED ORDER — SODIUM CHLORIDE 0.9 % IV SOLN: INTRAVENOUS | Status: DC

## 2013-05-12 MED ORDER — DEXAMETHASONE SODIUM PHOSPHATE 4 MG/ML IJ SOLN
10.0000 mg | Freq: Once | INTRAMUSCULAR | Status: AC
  Administered 2013-05-12: 10 mg via INTRAVENOUS
  Filled 2013-05-12: qty 3

## 2013-05-12 MED ORDER — PREDNISONE 10 MG PO TABS: 40.0000 mg | ORAL_TABLET | Freq: Every day | ORAL | Status: DC

## 2013-05-12 MED ORDER — DIPHENHYDRAMINE HCL 50 MG/ML IJ SOLN
25.0000 mg | Freq: Once | INTRAMUSCULAR | Status: AC
  Administered 2013-05-12: 25 mg via INTRAVENOUS
  Filled 2013-05-12: qty 1

## 2013-05-12 MED ORDER — SODIUM CHLORIDE 0.9 % IV BOLUS (SEPSIS): 250.0000 mL | Freq: Once | INTRAVENOUS | Status: DC

## 2013-05-12 MED ORDER — PROMETHAZINE HCL 25 MG/ML IJ SOLN
12.5000 mg | Freq: Once | INTRAMUSCULAR | Status: AC
  Administered 2013-05-12: 12.5 mg via INTRAVENOUS
  Filled 2013-05-12: qty 1

## 2013-05-12 MED ORDER — SODIUM CHLORIDE 0.9 % IV BOLUS (SEPSIS)
1000.0000 mL | Freq: Once | INTRAVENOUS | Status: AC
  Administered 2013-05-12: 1000 mL via INTRAVENOUS

## 2013-05-12 NOTE — ED Provider Notes (Signed)
CSN: [id number]     Arrival date & time 05/12/13  1559 History  This chart was scribed for Mervin Kung, MD by Zettie Pho, ED Scribe. This patient was seen in room APA14/APA14 and the patient's care was started at 4:47 PM.    Chief Complaint  Patient presents with  . Generalized Body Aches   The history is provided by the patient. No language interpreter was used.   HPI Comments: Emily [name] is a 33 y.o. Female with a history of multiple sclerosis, chronic pain, headaches/migraines, depression, and anxiety who presents to the Emergency Department complaining of generalized myalgias, described as "my skin feels like it is constantly on fire or being stabbed" with associated, diffuse headaches with blurred vision that she states feels similar to her past migraines. Patient's symptoms are chronic in nature, but she states that they have been constant and progressively worsening over the past month. She reports some associated confusion that she states is secondary to the pain. Patient does not appear confused currently. Patient reports that she was admitted at [facility] from 04/16/2013-04/22/2013 for an elevated WBC (more than 20,000 per patient) and received two LPs that were negative and an MRI that indicated "multiple lesions in my brain," but without further delineation of the cause of her symptoms. Patient reports that she has followed up at the pain clinic and was weaned off of a medication (patient did not specify which medication) as well as with her neurologist. Patient is followed by pain management at [facility]. She reports that she has been treated with steroids in the past, but that "it caused by gut to become too thin," and that she has not been on a course of steroids for 1-2 months. She denies fever, chills, cough, rhinorrhea, sore throat, chest pain, shortness of breath, abdominal pain, nausea, vomiting, diarrhea, leg swelling, dysuria, rash, history of bleeding easily. Patient has  allergies to amitriptyline, baclofen, Cymbalta, gabapentin, alprazolam, and Vicodin. Patient also has a history of GERD and asthma.   PCP- Dr. Celedonio Savage Neurologist- Dr. Lucy Antigua   Past Medical History  Diagnosis Date  . Headache(784.0)     migraines  . Multiple sclerosis   . Asthma as a child  . GERD (gastroesophageal reflux disease)   . Urinary urgency   . Anxiety   . Perforated bowel 2009  . Migraines   . Depression   . Chronic back pain   . Chronic pain    Past Surgical History  Procedure Laterality Date  . Extremity cyst excision  1994    right leg  . 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 Colostomy closure  04/2007  . Scar revision  01/21/2011    Procedure: SCAR REVISION;  Surgeon: Hermelinda Dellen;  Location: [place];  Service: Plastics;  Laterality: N/A;  exploration of scar of abdomen and repair of defect  . Hernia repair    . Abdominal surgery    . Appendectomy     History reviewed. No pertinent family history. History  Substance Use Topics  . Smoking status: Current Some Day Smoker    Types: Cigarettes  . Smokeless tobacco: Never Used     Comment: quit smoking 10 days ago  . Alcohol Use: Yes     Comment: Occ   OB History   Grav Para Term Preterm Abortions TAB SAB Ect Mult Living                 Review  of Systems  Constitutional: Negative for fever and chills.  HENT: Negative for rhinorrhea and sore throat.   Eyes: Positive for visual disturbance.  Respiratory: Negative for cough and shortness of breath.   Cardiovascular: Negative for chest pain and leg swelling.  Gastrointestinal: Negative for nausea, vomiting, abdominal pain and diarrhea.  Genitourinary: Negative for dysuria.  Musculoskeletal: Positive for myalgias.  Skin: Negative for rash.  Neurological: Positive for headaches.  Hematological: Does not bruise/bleed easily.  Psychiatric/Behavioral: Positive for confusion.    Allergies  Amitriptyline; Baclofen;  Cymbalta; Gabapentin; Other; Alprazolam; Vicodin; and Adhesive  Home Medications   Prior to Admission medications   Medication Sig Start Date End Date Taking? Authorizing Provider  clonazePAM (KLONOPIN) 1 MG tablet Take 1 mg by mouth 4 (four) times daily as needed for anxiety.    Historical Provider, MD  dicyclomine (BENTYL) 20 MG tablet Take 1 tablet (20 mg total) by mouth every 6 (six) hours as needed for spasms (abdominal cramping). 04/06/13   Alfonzo Feller, DO  divalproex (DEPAKOTE ER) 500 MG 24 hr tablet Take 500 mg by mouth daily. For depression    Historical Provider, MD  EPINEPHrine (EPIPEN) 0.3 mg/0.3 mL SOAJ injection Inject 0.3 mg into the muscle once.    Historical Provider, MD  etodolac (LODINE) 400 MG tablet Take 400 mg by mouth 2 (two) times daily.    Historical Provider, MD  Fingolimod HCl (GILENYA) 0.5 MG CAPS Take 1 capsule by mouth daily.    Historical Provider, MD  hydrOXYzine (ATARAX/VISTARIL) 10 MG tablet Take 10 mg by mouth daily as needed for itching.    Historical Provider, MD  levonorgestrel (MIRENA) 20 MCG/24HR IUD 1 each by Intrauterine route once.    Historical Provider, MD  loratadine (CLARITIN) 10 MG tablet Take 10 mg by mouth daily.      Historical Provider, MD  meloxicam (MOBIC) 7.5 MG tablet Take 1 tablet (7.5 mg total) by mouth daily. 03/15/13   Elisha Headland, NP  omeprazole (PRILOSEC) 40 MG capsule Take 40 mg by mouth daily.     Historical Provider, MD  ondansetron (ZOFRAN) 4 MG tablet Take 4 mg by mouth every 8 (eight) hours as needed for nausea or vomiting.     Historical Provider, MD  oxyCODONE (ROXICODONE) 15 MG immediate release tablet Take 15 mg by mouth every 4 (four) hours as needed for pain.     Historical Provider, MD  potassium chloride (K-DUR) 10 MEQ tablet Take 10 mEq by mouth 2 (two) times daily.    Historical Provider, MD  pregabalin (LYRICA) 100 MG capsule Take 300 mg by mouth daily.    Historical Provider, MD  prochlorperazine (COMPAZINE) 10  MG tablet Take 1 tablet (10 mg total) by mouth every 6 (six) hours as needed for nausea or vomiting. 04/06/13   Kathalene Frames, MD  promethazine (PHENERGAN) 25 MG suppository Place 1 suppository (25 mg total) rectally every 6 (six) hours as needed for nausea or vomiting. 04/08/13   [name], MD  promethazine (PHENERGAN) 25 MG tablet Take 1 tablet (25 mg total) by mouth every 6 (six) hours as needed for nausea or vomiting. 04/08/13   [name], MD  ranitidine (ZANTAC) 300 MG capsule Take 300 mg by mouth every evening.      Historical Provider, MD  SUMAtriptan (IMITREX) 6 MG/0.5ML SOLN injection Inject 6 mg into the skin every 2 (two) hours as needed for migraine or headache. May repeat in 2 hours if headache persists  or recurs.    Historical Provider, MD  topiramate (TOPAMAX) 100 MG tablet Take 100 mg by mouth 2 (two) times daily.     Historical Provider, MD  venlafaxine (EFFEXOR) 75 MG tablet Take 75 mg by mouth daily.    Historical Provider, MD   Triage Vitals: BP 151/94  Pulse 91  Temp(Src) 98.1 F (36.7 C) (Oral)  Resp 18  Ht 5\' 5"  (1.651 m)  Wt 200 lb (90.719 kg)  BMI 33.28 kg/m2  SpO2 99%  Physical Exam  Nursing note and vitals reviewed. Constitutional: She is oriented to person, place, and time. She appears well-developed and well-nourished. No distress.  HENT:  Head: Normocephalic and atraumatic.  Mouth/Throat: Oropharynx is clear and moist.  Eyes: Conjunctivae and EOM are normal.  Neck: Normal range of motion. Neck supple.  Cardiovascular: Normal rate, regular rhythm and normal heart sounds.   Pulmonary/Chest: Effort normal and breath sounds normal. No respiratory distress.  Abdominal: Soft. She exhibits no distension.  Musculoskeletal: Normal range of motion. She exhibits no edema.  Neurological: She is alert and oriented to person, place, and time. No cranial nerve deficit. She exhibits normal muscle tone. Coordination normal.  Skin: Skin is warm and dry.  Psychiatric:  She has a normal mood and affect. Her behavior is normal.    ED Course  Procedures (including critical care time)  DIAGNOSTIC STUDIES: Oxygen Saturation is 99% on room air, normal by my interpretation.    COORDINATION OF CARE: 5:01 PM- Patient is continuously asserting that it is okay for her to receive additional narcotics while under a pain contract as long as they come from the hospital. Discussed that patient cannot receive any narcotics while in the ED due to her pain contract, especially since her symptoms are chronic. Will order IV fluids, Phenergan, Decadron, and Benadryl to manage symptoms. Discussed treatment plan with patient at bedside and patient verbalized agreement.     Labs Review Labs Reviewed - No data to display  Imaging Review No results found.   EKG Interpretation None      MDM   Final diagnoses:  Migraine  Multiple sclerosis  Chronic pain    Patient's headache and MS symptoms sniffily improved with the migraine cocktail. Patient given Decadron Benadryl and Phenergan. Patient rested headache is resolved. We'll discharge her home with a continued course of prednisone for the next 5 days she has neurology as well as primary care and pain management followup with. Please symptoms related to her history of migraines and probably MS exacerbation. Patient nontoxic no acute distress.  I personally performed the services described in this documentation, which was scribed in my presence. The recorded information has been reviewed and is accurate.      Mervin Kung, MD 05/12/13 2016

## 2013-05-12 NOTE — ED Notes (Signed)
Patient is sound asleep doing hourly round.

## 2013-05-12 NOTE — ED Notes (Signed)
Has MS and was in the hospital one week ago.  States she feels really bad and is hurting. States she feels real hot.

## 2013-05-12 NOTE — Discharge Instructions (Signed)
The plan is to followup with your regular Dr. Joellyn Rued prednisone for the next 5 days as directed. Rest as much as possible.

## 2013-08-14 ENCOUNTER — Emergency Department (HOSPITAL_COMMUNITY): Payer: Medicare Other

## 2013-08-14 ENCOUNTER — Encounter (HOSPITAL_COMMUNITY): Payer: Self-pay | Admitting: Emergency Medicine

## 2013-08-14 ENCOUNTER — Other Ambulatory Visit (HOSPITAL_COMMUNITY): Payer: Medicare Other

## 2013-08-14 ENCOUNTER — Emergency Department (HOSPITAL_COMMUNITY)
Admission: EM | Admit: 2013-08-14 | Discharge: 2013-08-14 | Disposition: A | Discharge status: Home / Self Care | Payer: Medicare Other | Attending: Emergency Medicine | Admitting: Emergency Medicine

## 2013-08-14 DIAGNOSIS — N83202 Unspecified ovarian cyst, left side: Secondary | ICD-10-CM

## 2013-08-14 DIAGNOSIS — G43909 Migraine, unspecified, not intractable, without status migrainosus: Secondary | ICD-10-CM | POA: Insufficient documentation

## 2013-08-14 DIAGNOSIS — R109 Unspecified abdominal pain: Secondary | ICD-10-CM | POA: Insufficient documentation

## 2013-08-14 DIAGNOSIS — N730 Acute parametritis and pelvic cellulitis: Secondary | ICD-10-CM

## 2013-08-14 DIAGNOSIS — J45909 Unspecified asthma, uncomplicated: Secondary | ICD-10-CM | POA: Insufficient documentation

## 2013-08-14 DIAGNOSIS — F17200 Nicotine dependence, unspecified, uncomplicated: Secondary | ICD-10-CM | POA: Insufficient documentation

## 2013-08-14 DIAGNOSIS — G8929 Other chronic pain: Secondary | ICD-10-CM | POA: Diagnosis not present

## 2013-08-14 DIAGNOSIS — N739 Female pelvic inflammatory disease, unspecified: Secondary | ICD-10-CM | POA: Diagnosis not present

## 2013-08-14 DIAGNOSIS — Z79899 Other long term (current) drug therapy: Secondary | ICD-10-CM | POA: Diagnosis not present

## 2013-08-14 DIAGNOSIS — K219 Gastro-esophageal reflux disease without esophagitis: Secondary | ICD-10-CM | POA: Diagnosis not present

## 2013-08-14 DIAGNOSIS — G35 Multiple sclerosis: Secondary | ICD-10-CM | POA: Diagnosis not present

## 2013-08-14 DIAGNOSIS — N83209 Unspecified ovarian cyst, unspecified side: Secondary | ICD-10-CM | POA: Diagnosis not present

## 2013-08-14 DIAGNOSIS — F411 Generalized anxiety disorder: Secondary | ICD-10-CM | POA: Diagnosis not present

## 2013-08-14 DIAGNOSIS — Z3202 Encounter for pregnancy test, result negative: Secondary | ICD-10-CM | POA: Diagnosis not present

## 2013-08-14 LAB — COMPREHENSIVE METABOLIC PANEL
ALBUMIN: 3.4 g/dL — AB (ref 3.5–5.2)
ALK PHOS: 81 U/L (ref 39–117)
ALT: 14 U/L (ref 0–35)
AST: 13 U/L (ref 0–37)
Anion gap: 10 (ref 5–15)
BUN: 14 mg/dL (ref 6–23)
CO2: 24 meq/L (ref 19–32)
Calcium: 8.9 mg/dL (ref 8.4–10.5)
Chloride: 107 mEq/L (ref 96–112)
Creatinine, Ser: 0.73 mg/dL (ref 0.50–1.10)
GFR calc Af Amer: >90 mL/min (ref >90)
Glucose, Bld: 92 mg/dL (ref 70–99)
POTASSIUM: 4.5 meq/L (ref 3.7–5.3)
Sodium: 141 mEq/L (ref 137–147)
Total Bilirubin: 0.2 mg/dL — ABNORMAL LOW (ref 0.3–1.2)
Total Protein: 6.8 g/dL (ref 6.0–8.3)

## 2013-08-14 LAB — CBC WITH DIFFERENTIAL/PLATELET
BASOS ABS: 0 10*3/uL (ref 0.0–0.1)
BASOS PCT: 0 % (ref 0–1)
EOS PCT: 1 % (ref 0–5)
Eosinophils Absolute: 0.1 10*3/uL (ref 0.0–0.7)
HEMATOCRIT: 42.5 % (ref 36.0–46.0)
Hemoglobin: 14.2 g/dL (ref 12.0–15.0)
Lymphocytes Relative: 19 % (ref 12–46)
Lymphs Abs: 1.9 10*3/uL (ref 0.7–4.0)
MCH: 31.1 pg (ref 26.0–34.0)
MCHC: 33.4 g/dL (ref 30.0–36.0)
MCV: 93.2 fL (ref 78.0–100.0)
MONO ABS: 0.5 10*3/uL (ref 0.1–1.0)
Monocytes Relative: 5 % (ref 3–12)
Neutro Abs: 7.2 10*3/uL (ref 1.7–7.7)
Neutrophils Relative %: 75 % (ref 43–77)
PLATELETS: 394 10*3/uL (ref 150–400)
RBC: 4.56 MIL/uL (ref 3.87–5.11)
RDW: 13.3 % (ref 11.5–15.5)
WBC: 9.7 10*3/uL (ref 4.0–10.5)

## 2013-08-14 LAB — LIPASE, BLOOD: LIPASE: 27 U/L (ref 11–59)

## 2013-08-14 LAB — URINALYSIS, ROUTINE W REFLEX MICROSCOPIC
BILIRUBIN URINE: NEGATIVE
Glucose, UA: NEGATIVE mg/dL
HGB URINE DIPSTICK: NEGATIVE
Ketones, ur: NEGATIVE mg/dL
Leukocytes, UA: NEGATIVE
Nitrite: NEGATIVE
PROTEIN: NEGATIVE mg/dL
Specific Gravity, Urine: 1.015 (ref 1.005–1.030)
UROBILINOGEN UA: 0.2 mg/dL (ref 0.0–1.0)
pH: 6 (ref 5.0–8.0)

## 2013-08-14 LAB — PREGNANCY, URINE: PREG TEST UR: NEGATIVE

## 2013-08-14 LAB — WET PREP, GENITAL
Clue Cells Wet Prep HPF POC: NONE SEEN
Trich, Wet Prep: NONE SEEN
Yeast Wet Prep HPF POC: NONE SEEN

## 2013-08-14 MED ORDER — MORPHINE SULFATE 4 MG/ML IJ SOLN
4.0000 mg | Freq: Once | INTRAMUSCULAR | Status: AC
  Administered 2013-08-14: 4 mg via INTRAVENOUS
  Filled 2013-08-14: qty 1

## 2013-08-14 MED ORDER — ONDANSETRON HCL 8 MG PO TABS: 8.0000 mg | ORAL_TABLET | ORAL | Status: DC | PRN

## 2013-08-14 MED ORDER — ONDANSETRON HCL 4 MG/2ML IJ SOLN
4.0000 mg | Freq: Once | INTRAMUSCULAR | Status: AC
  Administered 2013-08-14: 4 mg via INTRAVENOUS
  Filled 2013-08-14 (×2): qty 2

## 2013-08-14 MED ORDER — ONDANSETRON HCL 4 MG/2ML IJ SOLN
4.0000 mg | Freq: Once | INTRAMUSCULAR | Status: AC
  Administered 2013-08-14: 4 mg via INTRAVENOUS
  Filled 2013-08-14: qty 2

## 2013-08-14 MED ORDER — CEFTRIAXONE SODIUM 500 MG IJ SOLR
250.0000 mg | INTRAMUSCULAR | Status: DC
  Administered 2013-08-14: 250 mg via INTRAVENOUS
  Filled 2013-08-14 (×2): qty 250

## 2013-08-14 MED ORDER — SODIUM CHLORIDE 0.9 % IV BOLUS (SEPSIS)
1000.0000 mL | Freq: Once | INTRAVENOUS | Status: AC
  Administered 2013-08-14: 1000 mL via INTRAVENOUS

## 2013-08-14 MED ORDER — HYDROMORPHONE HCL PF 1 MG/ML IJ SOLN
1.0000 mg | Freq: Once | INTRAMUSCULAR | Status: AC
  Administered 2013-08-14: 1 mg via INTRAVENOUS
  Filled 2013-08-14: qty 1

## 2013-08-14 MED ORDER — DOXYCYCLINE HYCLATE 100 MG PO CAPS: 100.0000 mg | ORAL_CAPSULE | Freq: Two times a day (BID) | ORAL | Status: DC

## 2013-08-14 MED ORDER — METRONIDAZOLE 500 MG PO TABS: 500.0000 mg | ORAL_TABLET | Freq: Three times a day (TID) | ORAL | Status: DC

## 2013-08-14 NOTE — ED Notes (Signed)
Pt co abdominal pain that radiates to vagina x 2 days, co n/v/d as well. Pt states she vomits every time she eats.

## 2013-08-14 NOTE — ED Provider Notes (Signed)
CSN: [id number]     Arrival date & time 08/14/13  8546 History  This chart was scribed for Nat Christen, MD by Ludger Nutting, ED Scribe. This patient was seen in room APA12/APA12 and the patient's care was started 9:58 AM.    Chief Complaint  Patient presents with  . Abdominal Pain    The history is provided by the patient. No language interpreter was used.    HPI Comments: Emily [name] is a 33 y.o. female with past medical history of perforated bowel, GERD who presents to the Emergency Department complaining of 1 week of constant periumbilical abdominal pain with intermittent radiation to the suprapubic region. She reports associated, multiple daily episodes of vomiting and diarrhea along with fatigue. She also reports a clear mucus vaginal discharge with flecks of blood which she has noticed over the last week. She reports having surgery for a perforated bowel in 2008, a bowel resection 2009, and a hernia repair after that. Per fiance, patient has had a recent abdominal XRAY. LMP unknown due to Mirena.   PCP [facility]  Past Medical History  Diagnosis Date  . Headache(784.0)     migraines  . Multiple sclerosis   . Asthma as a child  . GERD (gastroesophageal reflux disease)   . Urinary urgency   . Anxiety   . Perforated bowel 2009  . Migraines   . Depression   . Chronic back pain   . Chronic pain    Past Surgical History  Procedure Laterality Date  . Extremity cyst excision  1994    right leg  . 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 Colostomy closure  04/2007  . Scar revision  01/21/2011    Procedure: SCAR REVISION;  Surgeon: Hermelinda Dellen;  Location: [place];  Service: Plastics;  Laterality: N/A;  exploration of scar of abdomen and repair of defect  . Hernia repair    . Abdominal surgery    . Appendectomy     History reviewed. No pertinent family history. History  Substance Use Topics  . Smoking status: Current Some Day Smoker    Types:  Cigarettes  . Smokeless tobacco: Never Used     Comment: quit smoking 10 days ago  . Alcohol Use: Yes     Comment: Occ   OB History   Grav Para Term Preterm Abortions TAB SAB Ect Mult Living                 Review of Systems  A complete 10 system review of systems was obtained and all systems are negative except as noted in the HPI and PMH.    Allergies  Amitriptyline; Baclofen; Cymbalta; Gabapentin; Other; Alprazolam; Vicodin; and Adhesive  Home Medications   Prior to Admission medications   Medication Sig Start Date End Date Taking? Authorizing Provider  carbamazepine (CARBATROL) 200 MG 12 hr capsule Take 200 mg by mouth 3 (three) times daily. 04/22/13 04/22/14 Yes Historical Provider, MD  clonazePAM (KLONOPIN) 1 MG tablet Take 1 mg by mouth 2 (two) times daily as needed for anxiety.    Yes Historical Provider, MD  divalproex (DEPAKOTE ER) 500 MG 24 hr tablet Take 500 mg by mouth daily. For depression   Yes Historical Provider, MD  EPINEPHrine (EPIPEN) 0.3 mg/0.3 mL SOAJ injection Inject 0.3 mg into the muscle once.   Yes Historical Provider, MD  Fingolimod HCl (GILENYA) 0.5 MG CAPS Take 1 capsule by mouth daily.   Yes  Historical Provider, MD  hydrOXYzine (ATARAX/VISTARIL) 10 MG tablet Take 10 mg by mouth daily as needed for itching.   Yes Historical Provider, MD  levonorgestrel (MIRENA) 20 MCG/24HR IUD 1 each by Intrauterine route once.   Yes Historical Provider, MD  loratadine (CLARITIN) 10 MG tablet Take 10 mg by mouth daily.     Yes Historical Provider, MD  morphine (MSIR) 15 MG tablet Take 15 mg by mouth 4 (four) times daily.   Yes Historical Provider, MD  Multiple Vitamins-Minerals (AIRBORNE PO) Take 1 tablet by mouth daily.   Yes Historical Provider, MD  omeprazole (PRILOSEC) 40 MG capsule Take 40 mg by mouth daily.    Yes Historical Provider, MD  ondansetron (ZOFRAN) 4 MG tablet Take 4 mg by mouth every 8 (eight) hours as needed for nausea or vomiting.    Yes Historical  Provider, MD  potassium chloride (K-DUR) 10 MEQ tablet Take 10 mEq by mouth 2 (two) times daily.   Yes Historical Provider, MD  pregabalin (LYRICA) 100 MG capsule Take 300 mg by mouth at bedtime.    Yes Historical Provider, MD  propranolol (INDERAL) 10 MG tablet Take 10 mg by mouth 2 (two) times daily.   Yes Historical Provider, MD  ranitidine (ZANTAC) 300 MG capsule Take 300 mg by mouth every evening.     Yes Historical Provider, MD  SUMAtriptan (IMITREX) 6 MG/0.5ML SOLN injection Inject 6 mg into the skin every 2 (two) hours as needed for migraine or headache. May repeat in 2 hours if headache persists or recurs.   Yes Historical Provider, MD  topiramate (TOPAMAX) 100 MG tablet Take 100 mg by mouth 2 (two) times daily.    Yes Historical Provider, MD  doxycycline (VIBRAMYCIN) 100 MG capsule Take 1 capsule (100 mg total) by mouth 2 (two) times daily. 08/14/13   Nat Christen, MD  metroNIDAZOLE (FLAGYL) 500 MG tablet Take 1 tablet (500 mg total) by mouth 3 (three) times daily. 08/14/13   Nat Christen, MD  ondansetron (ZOFRAN) 8 MG tablet Take 1 tablet (8 mg total) by mouth every 4 (four) hours as needed. 08/14/13   Nat Christen, MD   BP 135/100  Pulse 91  Temp(Src) 98.4 F (36.9 C) (Oral)  Resp 18  SpO2 100% Physical Exam  Nursing note and vitals reviewed. Constitutional: She is oriented to person, place, and time. She appears well-developed and well-nourished.  HENT:  Head: Normocephalic and atraumatic.  Eyes: Conjunctivae and EOM are normal. Pupils are equal, round, and reactive to light.  Neck: Normal range of motion. Neck supple.  Cardiovascular: Normal rate, regular rhythm and normal heart sounds.   Pulmonary/Chest: Effort normal and breath sounds normal.  Abdominal: Soft. Bowel sounds are normal. There is tenderness.  Generalized tenderness in the mid abdomen, superiorly and inferiorly.    Genitourinary:  Normal external genitalia. Cervical motion tenderness right paracervical discharge. Minimal  bilateral adnexal tenderness.  Musculoskeletal: Normal range of motion.  Neurological: She is alert and oriented to person, place, and time.  Skin: Skin is warm and dry.  Psychiatric: She has a normal mood and affect. Her behavior is normal.    ED Course  Procedures (including critical care time)  DIAGNOSTIC STUDIES: Oxygen Saturation is 100% on RA, normal by my interpretation.    COORDINATION OF CARE: 10:05 AM Treatment plan includes IV fluids and pain management. Discussed treatment plan with pt at bedside and pt agreed to plan.   Labs Review Labs Reviewed  WET PREP, GENITAL - Abnormal;  Notable for the following:    WBC, Wet Prep HPF POC FEW (*)    All other components within normal limits  COMPREHENSIVE METABOLIC PANEL - Abnormal; Notable for the following:    Albumin 3.4 (*)    Total Bilirubin 0.2 (*)    All other components within normal limits  URINALYSIS, ROUTINE W REFLEX MICROSCOPIC - Abnormal; Notable for the following:    APPearance CLOUDY (*)    All other components within normal limits  GC/CHLAMYDIA PROBE AMP  CBC WITH DIFFERENTIAL  LIPASE, BLOOD  PREGNANCY, URINE    Imaging Review US Transvaginal Non-ob  08/14/2013   CLINICAL DATA:  Pelvic pain.  EXAM: TRANSABDOMINAL AND TRANSVAGINAL ULTRASOUND OF PELVIS  TECHNIQUE: Both transabdominal and transvaginal ultrasound examinations of the pelvis were performed. Transabdominal technique was performed for global imaging of the pelvis including uterus, ovaries, adnexal regions, and pelvic cul-de-sac. It was necessary to proceed with endovaginal exam following the transabdominal exam to visualize the ovaries and endometrium.  COMPARISON:  CT scan 04/07/2013  FINDINGS: Uterus  Measurements: 6.7 x 2.4 x 4.1 cm. No myometrial abnormalities.  Endometrium  Thickness: 9.3 mm.  IUD is noted.  Right ovary  Measurements: 4.1 x 2.2 x 3.3 cm. Normal appearance/no adnexal mass.  Left ovary  Measurements: 3.6 x 1.9 x 3.0 cm. A 1.7 x 1.7 x  2.3 cm complex paraovarian cyst is noted.  Other findings  Trace free pelvic fluid  IMPRESSION: Normal sonographic appearance of the uterus. An IUD is noted in the endometrial canal.  Normal right ovary.  2.3 x 1.7 x 1.7 cm slightly complex left para ovarian cyst. Recommend followup ultrasound examination in 4 months.   Electronically Signed   By: Kalman Jewels M.D.   On: 08/14/2013 13:36   US Pelvis Complete  08/14/2013   CLINICAL DATA:  Pelvic pain.  EXAM: TRANSABDOMINAL AND TRANSVAGINAL ULTRASOUND OF PELVIS  TECHNIQUE: Both transabdominal and transvaginal ultrasound examinations of the pelvis were performed. Transabdominal technique was performed for global imaging of the pelvis including uterus, ovaries, adnexal regions, and pelvic cul-de-sac. It was necessary to proceed with endovaginal exam following the transabdominal exam to visualize the ovaries and endometrium.  COMPARISON:  CT scan 04/07/2013  FINDINGS: Uterus  Measurements: 6.7 x 2.4 x 4.1 cm. No myometrial abnormalities.  Endometrium  Thickness: 9.3 mm.  IUD is noted.  Right ovary  Measurements: 4.1 x 2.2 x 3.3 cm. Normal appearance/no adnexal mass.  Left ovary  Measurements: 3.6 x 1.9 x 3.0 cm. A 1.7 x 1.7 x 2.3 cm complex paraovarian cyst is noted.  Other findings  Trace free pelvic fluid  IMPRESSION: Normal sonographic appearance of the uterus. An IUD is noted in the endometrial canal.  Normal right ovary.  2.3 x 1.7 x 1.7 cm slightly complex left para ovarian cyst. Recommend followup ultrasound examination in 4 months.   Electronically Signed   By: Kalman Jewels M.D.   On: 08/14/2013 13:36     EKG Interpretation None      MDM   Final diagnoses:  PID (acute pelvic inflammatory disease)  Left ovarian cyst    No evidence of bowel obstruction. Pelvic exam suspicious for pelvic inflammatory disease. Ultrasound reveals an abnormal left ovarian cyst.  Rx Rocephin 250 mg IV, prescriptions for doxycycline 100 mg and Flagyl 500 mg each  twice a day for [redacted] weeks along with Zofran 8 mg. She has gynecological followup next week. No acute abdomen. Afebrile. Normal white count. Patient advised to get  repeat ultrasound in 4 months to  I personally performed the services described in this documentation, which was scribed in my presence. The recorded information has been reviewed and is accurate.   Nat Christen, MD 08/14/13 1500

## 2013-08-14 NOTE — Discharge Instructions (Signed)
Followup your OB/GYN doctor next week. Prescriptions for 2 different antibiotics and nausea medication. Continue your pain medication at home. Additionally you have a left ovarian cyst. The radiologist recommends a repeat ultrasound in 4 months.

## 2013-08-15 ENCOUNTER — Emergency Department (HOSPITAL_BASED_OUTPATIENT_CLINIC_OR_DEPARTMENT_OTHER)
Admission: EM | Admit: 2013-08-15 | Discharge: 2013-08-15 | Disposition: A | Discharge status: Home / Self Care | Payer: Medicare Other | Attending: Emergency Medicine | Admitting: Emergency Medicine

## 2013-08-15 ENCOUNTER — Emergency Department (HOSPITAL_BASED_OUTPATIENT_CLINIC_OR_DEPARTMENT_OTHER): Payer: Medicare Other

## 2013-08-15 ENCOUNTER — Encounter (HOSPITAL_BASED_OUTPATIENT_CLINIC_OR_DEPARTMENT_OTHER): Payer: Self-pay | Admitting: Emergency Medicine

## 2013-08-15 DIAGNOSIS — F329 Major depressive disorder, single episode, unspecified: Secondary | ICD-10-CM | POA: Diagnosis not present

## 2013-08-15 DIAGNOSIS — F411 Generalized anxiety disorder: Secondary | ICD-10-CM | POA: Insufficient documentation

## 2013-08-15 DIAGNOSIS — G35 Multiple sclerosis: Secondary | ICD-10-CM | POA: Diagnosis not present

## 2013-08-15 DIAGNOSIS — J45909 Unspecified asthma, uncomplicated: Secondary | ICD-10-CM | POA: Diagnosis not present

## 2013-08-15 DIAGNOSIS — Z9089 Acquired absence of other organs: Secondary | ICD-10-CM | POA: Diagnosis not present

## 2013-08-15 DIAGNOSIS — K219 Gastro-esophageal reflux disease without esophagitis: Secondary | ICD-10-CM | POA: Insufficient documentation

## 2013-08-15 DIAGNOSIS — F17200 Nicotine dependence, unspecified, uncomplicated: Secondary | ICD-10-CM | POA: Diagnosis not present

## 2013-08-15 DIAGNOSIS — N83209 Unspecified ovarian cyst, unspecified side: Secondary | ICD-10-CM | POA: Insufficient documentation

## 2013-08-15 DIAGNOSIS — G43909 Migraine, unspecified, not intractable, without status migrainosus: Secondary | ICD-10-CM | POA: Insufficient documentation

## 2013-08-15 DIAGNOSIS — N83202 Unspecified ovarian cyst, left side: Secondary | ICD-10-CM

## 2013-08-15 DIAGNOSIS — Z9889 Other specified postprocedural states: Secondary | ICD-10-CM | POA: Insufficient documentation

## 2013-08-15 DIAGNOSIS — R1032 Left lower quadrant pain: Secondary | ICD-10-CM | POA: Diagnosis present

## 2013-08-15 DIAGNOSIS — F3289 Other specified depressive episodes: Secondary | ICD-10-CM | POA: Diagnosis not present

## 2013-08-15 DIAGNOSIS — G8929 Other chronic pain: Secondary | ICD-10-CM | POA: Diagnosis not present

## 2013-08-15 LAB — CBC WITH DIFFERENTIAL/PLATELET
BASOS PCT: 1 % (ref 0–1)
Basophils Absolute: 0 10*3/uL (ref 0.0–0.1)
EOS ABS: 0.1 10*3/uL (ref 0.0–0.7)
Eosinophils Relative: 1 % (ref 0–5)
HCT: 41.8 % (ref 36.0–46.0)
HEMOGLOBIN: 14 g/dL (ref 12.0–15.0)
LYMPHS ABS: 1.6 10*3/uL (ref 0.7–4.0)
Lymphocytes Relative: 19 % (ref 12–46)
MCH: 31.2 pg (ref 26.0–34.0)
MCHC: 33.5 g/dL (ref 30.0–36.0)
MCV: 93.1 fL (ref 78.0–100.0)
MONO ABS: 0.6 10*3/uL (ref 0.1–1.0)
MONOS PCT: 7 % (ref 3–12)
NEUTROS PCT: 73 % (ref 43–77)
Neutro Abs: 6.2 10*3/uL (ref 1.7–7.7)
Platelets: 352 10*3/uL (ref 150–400)
RBC: 4.49 MIL/uL (ref 3.87–5.11)
RDW: 13 % (ref 11.5–15.5)
WBC: 8.5 10*3/uL (ref 4.0–10.5)

## 2013-08-15 LAB — URINALYSIS, ROUTINE W REFLEX MICROSCOPIC
BILIRUBIN URINE: NEGATIVE
Glucose, UA: NEGATIVE mg/dL
HGB URINE DIPSTICK: NEGATIVE
Ketones, ur: NEGATIVE mg/dL
Leukocytes, UA: NEGATIVE
Nitrite: NEGATIVE
Protein, ur: NEGATIVE mg/dL
Specific Gravity, Urine: 1.013 (ref 1.005–1.030)
Urobilinogen, UA: 0.2 mg/dL (ref 0.0–1.0)
pH: 5.5 (ref 5.0–8.0)

## 2013-08-15 LAB — BASIC METABOLIC PANEL
Anion gap: 12 (ref 5–15)
BUN: 8 mg/dL (ref 6–23)
CHLORIDE: 106 meq/L (ref 96–112)
CO2: 24 mEq/L (ref 19–32)
CREATININE: 0.7 mg/dL (ref 0.50–1.10)
Calcium: 9 mg/dL (ref 8.4–10.5)
GFR calc non Af Amer: >90 mL/min (ref >90)
GLUCOSE: 99 mg/dL (ref 70–99)
POTASSIUM: 4.4 meq/L (ref 3.7–5.3)
Sodium: 142 mEq/L (ref 137–147)

## 2013-08-15 MED ORDER — KETOROLAC TROMETHAMINE 30 MG/ML IJ SOLN
30.0000 mg | Freq: Once | INTRAMUSCULAR | Status: DC
  Filled 2013-08-15: qty 1

## 2013-08-15 MED ORDER — SODIUM CHLORIDE 0.9 % IV BOLUS (SEPSIS)
1000.0000 mL | Freq: Once | INTRAVENOUS | Status: AC
  Administered 2013-08-15: 1000 mL via INTRAVENOUS

## 2013-08-15 MED ORDER — HYDROMORPHONE HCL PF 1 MG/ML IJ SOLN
1.0000 mg | Freq: Once | INTRAMUSCULAR | Status: AC
  Administered 2013-08-15: 1 mg via INTRAVENOUS
  Filled 2013-08-15: qty 1

## 2013-08-15 MED ORDER — OXYCODONE-ACETAMINOPHEN 7.5-325 MG PO TABS: 1.0000 | ORAL_TABLET | ORAL | Status: DC | PRN

## 2013-08-15 MED ORDER — ONDANSETRON HCL 4 MG/2ML IJ SOLN
4.0000 mg | Freq: Once | INTRAMUSCULAR | Status: AC
  Administered 2013-08-15: 4 mg via INTRAVENOUS
  Filled 2013-08-15: qty 2

## 2013-08-15 NOTE — ED Provider Notes (Signed)
CSN: [id number]     Arrival date & time 08/15/13  1303 History   First MD Initiated Contact with Patient 08/15/13 1312     Chief Complaint  Patient presents with  . Abdominal Pain     (Consider location/radiation/quality/duration/timing/severity/associated sxs/prior Treatment) Patient is a 33 y.o. female presenting with abdominal pain. The history is provided by the patient.  Abdominal Pain Pain location:  LLQ Pain quality: sharp and throbbing   Pain radiates to:  Does not radiate Pain severity:  Severe Onset quality:  Gradual Duration:  1 week Timing:  Constant Progression:  Worsening Chronicity:  New Relieved by:  Nothing Worsened by:  Nothing tried Ineffective treatments: morphine. Associated symptoms: chills, cough, diarrhea, fever, nausea and vomiting   Associated symptoms: no anorexia, no constipation and no dysuria    Emily [name] is a 33 y.o. female who presents to the ED via EMS with severe lower abdominal pain. She was evaluated yesterday at [facility] ED with similar problem. She had an ultrasound that showed an ovarian cyst. Today the pain is more severe.  She is taking morphine for the pain because that is what she takes for her MS pain but without relief. She has an IUD for birthcontrol. She had a negative pregnancy test yesterday. She was treated with Rocephin 250 mg IM and given Rx for Doxycycline and Flagyl.   Past Medical History  Diagnosis Date  . Headache(784.0)     migraines  . Multiple sclerosis   . Asthma as a child  . GERD (gastroesophageal reflux disease)   . Urinary urgency   . Anxiety   . Perforated bowel 2009  . Migraines   . Depression   . Chronic back pain   . Chronic pain    Past Surgical History  Procedure Laterality Date  . Extremity cyst excision  1994    right leg  . 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 Colostomy closure  04/2007  . Scar revision  01/21/2011    Procedure: SCAR REVISION;  Surgeon: Hermelinda Dellen;  Location:  [place];  Service: Plastics;  Laterality: N/A;  exploration of scar of abdomen and repair of defect  . Hernia repair    . Abdominal surgery    . Appendectomy     History reviewed. No pertinent family history. History  Substance Use Topics  . Smoking status: Current Some Day Smoker    Types: Cigarettes  . Smokeless tobacco: Never Used     Comment: quit smoking 10 days ago  . Alcohol Use: Yes     Comment: Occ   OB History   Grav Para Term Preterm Abortions TAB SAB Ect Mult Living                 Review of Systems  Constitutional: Positive for fever and chills.  Respiratory: Positive for cough.   Gastrointestinal: Positive for nausea, vomiting, abdominal pain and diarrhea. Negative for constipation and anorexia.  Genitourinary: Negative for dysuria.  all other systems negative    Allergies  Amitriptyline; Baclofen; Cymbalta; Gabapentin; Other; Alprazolam; Vicodin; and Adhesive  Home Medications   Prior to Admission medications   Medication Sig Start Date End Date Taking? Authorizing Provider  carbamazepine (CARBATROL) 200 MG 12 hr capsule Take 200 mg by mouth 3 (three) times daily. 04/22/13 04/22/14  Historical Provider, MD  clonazePAM (KLONOPIN) 1 MG tablet Take 1 mg by mouth 2 (two) times daily as needed for anxiety.  Historical Provider, MD  divalproex (DEPAKOTE ER) 500 MG 24 hr tablet Take 500 mg by mouth daily. For depression    Historical Provider, MD  doxycycline (VIBRAMYCIN) 100 MG capsule Take 1 capsule (100 mg total) by mouth 2 (two) times daily. 08/14/13   Nat Christen, MD  EPINEPHrine (EPIPEN) 0.3 mg/0.3 mL SOAJ injection Inject 0.3 mg into the muscle once.    Historical Provider, MD  Fingolimod HCl (GILENYA) 0.5 MG CAPS Take 1 capsule by mouth daily.    Historical Provider, MD  hydrOXYzine (ATARAX/VISTARIL) 10 MG tablet Take 10 mg by mouth daily as needed for itching.    Historical Provider, MD  levonorgestrel (MIRENA) 20 MCG/24HR IUD 1 each by  Intrauterine route once.    Historical Provider, MD  loratadine (CLARITIN) 10 MG tablet Take 10 mg by mouth daily.      Historical Provider, MD  metroNIDAZOLE (FLAGYL) 500 MG tablet Take 1 tablet (500 mg total) by mouth 3 (three) times daily. 08/14/13   Nat Christen, MD  morphine (MSIR) 15 MG tablet Take 15 mg by mouth 4 (four) times daily.    Historical Provider, MD  Multiple Vitamins-Minerals (AIRBORNE PO) Take 1 tablet by mouth daily.    Historical Provider, MD  omeprazole (PRILOSEC) 40 MG capsule Take 40 mg by mouth daily.     Historical Provider, MD  ondansetron (ZOFRAN) 4 MG tablet Take 4 mg by mouth every 8 (eight) hours as needed for nausea or vomiting.     Historical Provider, MD  ondansetron (ZOFRAN) 8 MG tablet Take 1 tablet (8 mg total) by mouth every 4 (four) hours as needed. 08/14/13   Nat Christen, MD  potassium chloride (K-DUR) 10 MEQ tablet Take 10 mEq by mouth 2 (two) times daily.    Historical Provider, MD  pregabalin (LYRICA) 100 MG capsule Take 300 mg by mouth at bedtime.     Historical Provider, MD  propranolol (INDERAL) 10 MG tablet Take 10 mg by mouth 2 (two) times daily.    Historical Provider, MD  ranitidine (ZANTAC) 300 MG capsule Take 300 mg by mouth every evening.      Historical Provider, MD  SUMAtriptan (IMITREX) 6 MG/0.5ML SOLN injection Inject 6 mg into the skin every 2 (two) hours as needed for migraine or headache. May repeat in 2 hours if headache persists or recurs.    Historical Provider, MD  topiramate (TOPAMAX) 100 MG tablet Take 100 mg by mouth 2 (two) times daily.     Historical Provider, MD   BP 135/97  Pulse 89  Temp(Src) 98.1 F (36.7 C) (Oral)  Resp 18  Ht 5\' 5"  (1.651 m)  Wt 204 lb (92.534 kg)  BMI 33.95 kg/m2  SpO2 100%  LMP 08/08/2013 Physical Exam  Nursing note and vitals reviewed. Constitutional: She is oriented to person, place, and time. She appears well-developed and well-nourished.  HENT:  Head: Normocephalic.  Eyes: EOM are normal.  Neck:  Neck supple.  Cardiovascular: Normal rate.   Pulmonary/Chest: Effort normal.  Abdominal: Soft. Bowel sounds are normal. There is tenderness in the left lower quadrant. There is no rebound and no guarding.  Genitourinary:  Done yesterday with cultures and wet prep, not repeated today.   Musculoskeletal: Normal range of motion.  Neurological: She is alert and oriented to person, place, and time. No cranial nerve deficit.  Skin: Skin is warm and dry.  Psychiatric: She has a normal mood and affect. Her behavior is normal.    ED Course  Procedures ( Ultrasound, labs, IV fluids, Dilaudid, Toradol, Zofran US Transvaginal Non-ob  08/15/2013   CLINICAL DATA:  Left ovarian cyst. Evaluate for torsion. Sudden increase in pain today. MS. History of bowel perforation. LMP 6 years ago. IUD.  EXAM: TRANSABDOMINAL AND TRANSVAGINAL ULTRASOUND OF PELVIS  DOPPLER ULTRASOUND OF OVARIES  TECHNIQUE: Both transabdominal and transvaginal ultrasound examinations of the pelvis were performed. Transabdominal technique was performed for global imaging of the pelvis including uterus, ovaries, adnexal regions, and pelvic cul-de-sac.  It was necessary to proceed with endovaginal exam following the transabdominal exam to visualize the uterus, endometrium, ovaries. Color and duplex Doppler ultrasound was utilized to evaluate blood flow to the ovaries.  COMPARISON:  08/14/2013  FINDINGS: Uterus  Measurements: 7.0 x 3.2 x 3.9 cm. Heterogeneous echotexture without focal mass.  Endometrium  Thickness: 3.8 mm. Intrauterine device in place in the central endometrial canal.  Right ovary  Measurements: 3.4 x 2.4 x 2.4 cm. Normal appearance/no adnexal mass.  Left ovary  Measurements: 5.0 x 2.4 x 1.9 cm. A hypoechoic lesion is seen along the periphery of the left ovary, containing anechoic and hypoechoic portions. Mass measures 1.7 x 1.6 x 2.0 cm and appears avascular centrally. On yesterday's exam mass measured 1.7 x 1.7 x 2.3 cm.  Pulsed  Doppler evaluation of both ovaries demonstrates normal low-resistance arterial and venous waveforms.  Other findings  There is a small amount of free pelvic fluid, likely physiologic.  IMPRESSION: 1. Normal appearance of the uterus.  Intrauterine device. 2. Normal appearance of the right ovary. 3. Solid and cystic mass in the left ovary, stable in appearance since yesterday's study. There is no evidence for torsion. Followup pelvic ultrasound has been recommended in 4 months.   Electronically Signed   By: Shon Hale M.D.   On: 08/15/2013 14:34   US Transvaginal Non-ob  08/14/2013   CLINICAL DATA:  Pelvic pain.  EXAM: TRANSABDOMINAL AND TRANSVAGINAL ULTRASOUND OF PELVIS  TECHNIQUE: Both transabdominal and transvaginal ultrasound examinations of the pelvis were performed. Transabdominal technique was performed for global imaging of the pelvis including uterus, ovaries, adnexal regions, and pelvic cul-de-sac. It was necessary to proceed with endovaginal exam following the transabdominal exam to visualize the ovaries and endometrium.  COMPARISON:  CT scan 04/07/2013  FINDINGS: Uterus  Measurements: 6.7 x 2.4 x 4.1 cm. No myometrial abnormalities.  Endometrium  Thickness: 9.3 mm.  IUD is noted.  Right ovary  Measurements: 4.1 x 2.2 x 3.3 cm. Normal appearance/no adnexal mass.  Left ovary  Measurements: 3.6 x 1.9 x 3.0 cm. A 1.7 x 1.7 x 2.3 cm complex paraovarian cyst is noted.  Other findings  Trace free pelvic fluid  IMPRESSION: Normal sonographic appearance of the uterus. An IUD is noted in the endometrial canal.  Normal right ovary.  2.3 x 1.7 x 1.7 cm slightly complex left para ovarian cyst. Recommend followup ultrasound examination in 4 months.   Electronically Signed   By: Kalman Jewels M.D.   On: 08/14/2013 13:36   US Pelvis Complete  08/15/2013   CLINICAL DATA:  Left ovarian cyst. Evaluate for torsion. Sudden increase in pain today. MS. History of bowel perforation. LMP 6 years ago. IUD.  EXAM:  TRANSABDOMINAL AND TRANSVAGINAL ULTRASOUND OF PELVIS  DOPPLER ULTRASOUND OF OVARIES  TECHNIQUE: Both transabdominal and transvaginal ultrasound examinations of the pelvis were performed. Transabdominal technique was performed for global imaging of the pelvis including uterus, ovaries, adnexal regions, and pelvic cul-de-sac.  It was necessary to proceed with  endovaginal exam following the transabdominal exam to visualize the uterus, endometrium, ovaries. Color and duplex Doppler ultrasound was utilized to evaluate blood flow to the ovaries.  COMPARISON:  08/14/2013  FINDINGS: Uterus  Measurements: 7.0 x 3.2 x 3.9 cm. Heterogeneous echotexture without focal mass.  Endometrium  Thickness: 3.8 mm. Intrauterine device in place in the central endometrial canal.  Right ovary  Measurements: 3.4 x 2.4 x 2.4 cm. Normal appearance/no adnexal mass.  Left ovary  Measurements: 5.0 x 2.4 x 1.9 cm. A hypoechoic lesion is seen along the periphery of the left ovary, containing anechoic and hypoechoic portions. Mass measures 1.7 x 1.6 x 2.0 cm and appears avascular centrally. On yesterday's exam mass measured 1.7 x 1.7 x 2.3 cm.  Pulsed Doppler evaluation of both ovaries demonstrates normal low-resistance arterial and venous waveforms.  Other findings  There is a small amount of free pelvic fluid, likely physiologic.  IMPRESSION: 1. Normal appearance of the uterus.  Intrauterine device. 2. Normal appearance of the right ovary. 3. Solid and cystic mass in the left ovary, stable in appearance since yesterday's study. There is no evidence for torsion. Followup pelvic ultrasound has been recommended in 4 months.   Electronically Signed   By: Shon Hale M.D.   On: 08/15/2013 14:34   US Pelvis Complete  08/14/2013   CLINICAL DATA:  Pelvic pain.  EXAM: TRANSABDOMINAL AND TRANSVAGINAL ULTRASOUND OF PELVIS  TECHNIQUE: Both transabdominal and transvaginal ultrasound examinations of the pelvis were performed. Transabdominal technique was  performed for global imaging of the pelvis including uterus, ovaries, adnexal regions, and pelvic cul-de-sac. It was necessary to proceed with endovaginal exam following the transabdominal exam to visualize the ovaries and endometrium.  COMPARISON:  CT scan 04/07/2013  FINDINGS: Uterus  Measurements: 6.7 x 2.4 x 4.1 cm. No myometrial abnormalities.  Endometrium  Thickness: 9.3 mm.  IUD is noted.  Right ovary  Measurements: 4.1 x 2.2 x 3.3 cm. Normal appearance/no adnexal mass.  Left ovary  Measurements: 3.6 x 1.9 x 3.0 cm. A 1.7 x 1.7 x 2.3 cm complex paraovarian cyst is noted.  Other findings  Trace free pelvic fluid  IMPRESSION: Normal sonographic appearance of the uterus. An IUD is noted in the endometrial canal.  Normal right ovary.  2.3 x 1.7 x 1.7 cm slightly complex left para ovarian cyst. Recommend followup ultrasound examination in 4 months.   Electronically Signed   By: Kalman Jewels M.D.   On: 08/14/2013 13:36   [place] Art/ven Flow Abd Pelv Doppler  08/15/2013   CLINICAL DATA:  Left ovarian cyst. Evaluate for torsion. Sudden increase in pain today. MS. History of bowel perforation. LMP 6 years ago. IUD.  EXAM: TRANSABDOMINAL AND TRANSVAGINAL ULTRASOUND OF PELVIS  DOPPLER ULTRASOUND OF OVARIES  TECHNIQUE: Both transabdominal and transvaginal ultrasound examinations of the pelvis were performed. Transabdominal technique was performed for global imaging of the pelvis including uterus, ovaries, adnexal regions, and pelvic cul-de-sac.  It was necessary to proceed with endovaginal exam following the transabdominal exam to visualize the uterus, endometrium, ovaries. Color and duplex Doppler ultrasound was utilized to evaluate blood flow to the ovaries.  COMPARISON:  08/14/2013  FINDINGS: Uterus  Measurements: 7.0 x 3.2 x 3.9 cm. Heterogeneous echotexture without focal mass.  Endometrium  Thickness: 3.8 mm. Intrauterine device in place in the central endometrial canal.  Right ovary  Measurements: 3.4 x 2.4 x 2.4  cm. Normal appearance/no adnexal mass.  Left ovary  Measurements: 5.0 x 2.4 x 1.9 cm. A  hypoechoic lesion is seen along the periphery of the left ovary, containing anechoic and hypoechoic portions. Mass measures 1.7 x 1.6 x 2.0 cm and appears avascular centrally. On yesterday's exam mass measured 1.7 x 1.7 x 2.3 cm.  Pulsed Doppler evaluation of both ovaries demonstrates normal low-resistance arterial and venous waveforms.  Other findings  There is a small amount of free pelvic fluid, likely physiologic.  IMPRESSION: 1. Normal appearance of the uterus.  Intrauterine device. 2. Normal appearance of the right ovary. 3. Solid and cystic mass in the left ovary, stable in appearance since yesterday's study. There is no evidence for torsion. Followup pelvic ultrasound has been recommended in 4 months.   Electronically Signed   By: Shon Hale M.D.   On: 08/15/2013 14:34    Results for orders placed during the hospital encounter of 08/15/13 (from the past 24 hour(s))  CBC WITH DIFFERENTIAL     Status: None   Collection Time    08/15/13  1:30 PM      Result Value Ref Range   WBC 8.5  4.0 - 10.5 K/uL   RBC 4.49  3.87 - 5.11 MIL/uL   Hemoglobin 14.0  12.0 - 15.0 g/dL   HCT 41.8  36.0 - 46.0 %   MCV 93.1  78.0 - 100.0 fL   MCH 31.2  26.0 - 34.0 pg   MCHC 33.5  30.0 - 36.0 g/dL   RDW 13.0  11.5 - 15.5 %   Platelets 352  150 - 400 K/uL   Neutrophils Relative % 73  43 - 77 %   Neutro Abs 6.2  1.7 - 7.7 K/uL   Lymphocytes Relative 19  12 - 46 %   Lymphs Abs 1.6  0.7 - 4.0 K/uL   Monocytes Relative 7  3 - 12 %   Monocytes Absolute 0.6  0.1 - 1.0 K/uL   Eosinophils Relative 1  0 - 5 %   Eosinophils Absolute 0.1  0.0 - 0.7 K/uL   Basophils Relative 1  0 - 1 %   Basophils Absolute 0.0  0.0 - 0.1 K/uL  BASIC METABOLIC PANEL     Status: None   Collection Time    08/15/13  1:30 PM      Result Value Ref Range   Sodium 142  137 - 147 mEq/L   Potassium 4.4  3.7 - 5.3 mEq/L   Chloride 106  96 - 112 mEq/L    CO2 24  19 - 32 mEq/L   Glucose, Bld 99  70 - 99 mg/dL   BUN 8  6 - 23 mg/dL   Creatinine, Ser 0.70  0.50 - 1.10 mg/dL   Calcium 9.0  8.4 - 10.5 mg/dL   GFR calc non Af Amer >90  >90 mL/min   GFR calc Af Amer >90  >90 mL/min   Anion gap 12  5 - 15  URINALYSIS, ROUTINE W REFLEX MICROSCOPIC     Status: Abnormal   Collection Time    08/15/13  1:42 PM      Result Value Ref Range   Color, Urine YELLOW  YELLOW   APPearance CLOUDY (*) CLEAR   Specific Gravity, Urine 1.013  1.005 - 1.030   pH 5.5  5.0 - 8.0   Glucose, UA NEGATIVE  NEGATIVE mg/dL   Hgb urine dipstick NEGATIVE  NEGATIVE   Bilirubin Urine NEGATIVE  NEGATIVE   Ketones, ur NEGATIVE  NEGATIVE mg/dL   Protein, ur NEGATIVE  NEGATIVE mg/dL  Urobilinogen, UA 0.2  0.0 - 1.0 mg/dL   Nitrite NEGATIVE  NEGATIVE   Leukocytes, UA NEGATIVE  NEGATIVE     MDM  33 y.o. female with LLQ abdominal pain and left ovarian cyst. Stable for discharge without torsion or ectopic pregnancy. Pain controlled with Dilaudid given in the ED. Normal labs, ultrasound unchanged from yesterday. Patient to follow up with her GYN tomorrow. (Dr. Derenda Mis in [place]). She will return as needed for worsening symptoms. Discussed with the patient and all questioned fully answered.    Medication List    TAKE these medications       oxyCODONE-acetaminophen 7.5-325 MG per tablet  Commonly known as:  PERCOCET  Take 1 tablet by mouth every 4 (four) hours as needed for pain.      ASK your doctor about these medications       AIRBORNE PO  Take 1 tablet by mouth daily.     carbamazepine 200 MG 12 hr capsule  Commonly known as:  CARBATROL  Take 200 mg by mouth 3 (three) times daily.     clonazePAM 1 MG tablet  Commonly known as:  KLONOPIN  Take 1 mg by mouth 2 (two) times daily as needed for anxiety.     divalproex 500 MG 24 hr tablet  Commonly known as:  DEPAKOTE ER  Take 500 mg by mouth daily. For depression     doxycycline 100 MG capsule  Commonly known as:   VIBRAMYCIN  Take 1 capsule (100 mg total) by mouth 2 (two) times daily.     EPIPEN 0.3 mg/0.3 mL Soaj injection  Generic drug:  EPINEPHrine  Inject 0.3 mg into the muscle once.     GILENYA 0.5 MG Caps  Generic drug:  Fingolimod HCl  Take 1 capsule by mouth daily.     hydrOXYzine 10 MG tablet  Commonly known as:  ATARAX/VISTARIL  Take 10 mg by mouth daily as needed for itching.     levonorgestrel 20 MCG/24HR IUD  Commonly known as:  MIRENA  1 each by Intrauterine route once.     loratadine 10 MG tablet  Commonly known as:  CLARITIN  Take 10 mg by mouth daily.     metroNIDAZOLE 500 MG tablet  Commonly known as:  FLAGYL  Take 1 tablet (500 mg total) by mouth 3 (three) times daily.     morphine 15 MG tablet  Commonly known as:  MSIR  Take 15 mg by mouth 4 (four) times daily.     omeprazole 40 MG capsule  Commonly known as:  PRILOSEC  Take 40 mg by mouth daily.     ondansetron 4 MG tablet  Commonly known as:  ZOFRAN  Take 4 mg by mouth every 8 (eight) hours as needed for nausea or vomiting.     ondansetron 8 MG tablet  Commonly known as:  ZOFRAN  Take 1 tablet (8 mg total) by mouth every 4 (four) hours as needed.     potassium chloride 10 MEQ tablet  Commonly known as:  K-DUR  Take 10 mEq by mouth 2 (two) times daily.     pregabalin 100 MG capsule  Commonly known as:  LYRICA  Take 300 mg by mouth at bedtime.     propranolol 10 MG tablet  Commonly known as:  INDERAL  Take 10 mg by mouth 2 (two) times daily.     ranitidine 300 MG capsule  Commonly known as:  ZANTAC  Take 300 mg by mouth every evening.  SUMAtriptan 6 MG/0.5ML Soln injection  Commonly known as:  IMITREX  Inject 6 mg into the skin every 2 (two) hours as needed for migraine or headache. May repeat in 2 hours if headache persists or recurs.     topiramate 100 MG tablet  Commonly known as:  TOPAMAX  Take 100 mg by mouth 2 (two) times daily.            [place], [place] 08/15/13 [phone]

## 2013-08-15 NOTE — Discharge Instructions (Signed)
Call Dr. Derenda Mis tomorrow for follow up. Do not take the narcotics if you are driving because they will make you sleepy. Get the prescriptions that Dr. Lacinda Axon gave you yesterday filled.

## 2013-08-15 NOTE — ED Notes (Addendum)
Pt presents to ED via EMS with complaints of abdominal pain since yesterday. Pt was seen at [facility] yesterday. PT reports at [facility] yesterday she had a vaginal [place] and it showed ovarian cyst, pt states her pain was worse today.

## 2013-08-17 ENCOUNTER — Emergency Department (HOSPITAL_COMMUNITY): Payer: Medicare Other

## 2013-08-17 ENCOUNTER — Encounter (HOSPITAL_COMMUNITY): Payer: Self-pay | Admitting: Emergency Medicine

## 2013-08-17 ENCOUNTER — Emergency Department (HOSPITAL_COMMUNITY)
Admission: EM | Admit: 2013-08-17 | Discharge: 2013-08-17 | Disposition: A | Discharge status: Home / Self Care | Payer: Medicare Other | Attending: Emergency Medicine | Admitting: Emergency Medicine

## 2013-08-17 DIAGNOSIS — F411 Generalized anxiety disorder: Secondary | ICD-10-CM | POA: Insufficient documentation

## 2013-08-17 DIAGNOSIS — Z792 Long term (current) use of antibiotics: Secondary | ICD-10-CM | POA: Insufficient documentation

## 2013-08-17 DIAGNOSIS — F3289 Other specified depressive episodes: Secondary | ICD-10-CM | POA: Diagnosis not present

## 2013-08-17 DIAGNOSIS — G8929 Other chronic pain: Secondary | ICD-10-CM | POA: Insufficient documentation

## 2013-08-17 DIAGNOSIS — R1032 Left lower quadrant pain: Secondary | ICD-10-CM | POA: Insufficient documentation

## 2013-08-17 DIAGNOSIS — G35 Multiple sclerosis: Secondary | ICD-10-CM | POA: Diagnosis not present

## 2013-08-17 DIAGNOSIS — Z79899 Other long term (current) drug therapy: Secondary | ICD-10-CM | POA: Diagnosis not present

## 2013-08-17 DIAGNOSIS — G43909 Migraine, unspecified, not intractable, without status migrainosus: Secondary | ICD-10-CM | POA: Diagnosis not present

## 2013-08-17 DIAGNOSIS — F329 Major depressive disorder, single episode, unspecified: Secondary | ICD-10-CM | POA: Insufficient documentation

## 2013-08-17 DIAGNOSIS — Z8742 Personal history of other diseases of the female genital tract: Secondary | ICD-10-CM | POA: Diagnosis not present

## 2013-08-17 DIAGNOSIS — K219 Gastro-esophageal reflux disease without esophagitis: Secondary | ICD-10-CM | POA: Diagnosis not present

## 2013-08-17 DIAGNOSIS — F17200 Nicotine dependence, unspecified, uncomplicated: Secondary | ICD-10-CM | POA: Insufficient documentation

## 2013-08-17 DIAGNOSIS — J45909 Unspecified asthma, uncomplicated: Secondary | ICD-10-CM | POA: Insufficient documentation

## 2013-08-17 HISTORY — DX: Unspecified ovarian cyst, unspecified side: N83.209

## 2013-08-17 LAB — CBC WITH DIFFERENTIAL/PLATELET
Basophils Absolute: 0 10*3/uL (ref 0.0–0.1)
Basophils Relative: 0 % (ref 0–1)
EOS ABS: 0 10*3/uL (ref 0.0–0.7)
EOS PCT: 1 % (ref 0–5)
HCT: 40.4 % (ref 36.0–46.0)
HEMOGLOBIN: 13.9 g/dL (ref 12.0–15.0)
LYMPHS ABS: 1.6 10*3/uL (ref 0.7–4.0)
Lymphocytes Relative: 33 % (ref 12–46)
MCH: 31.2 pg (ref 26.0–34.0)
MCHC: 34.4 g/dL (ref 30.0–36.0)
MCV: 90.6 fL (ref 78.0–100.0)
MONO ABS: 0.3 10*3/uL (ref 0.1–1.0)
Monocytes Relative: 7 % (ref 3–12)
Neutro Abs: 3 10*3/uL (ref 1.7–7.7)
Neutrophils Relative %: 59 % (ref 43–77)
Platelets: 303 10*3/uL (ref 150–400)
RBC: 4.46 MIL/uL (ref 3.87–5.11)
RDW: 13.1 % (ref 11.5–15.5)
WBC: 5.1 10*3/uL (ref 4.0–10.5)

## 2013-08-17 LAB — URINALYSIS, ROUTINE W REFLEX MICROSCOPIC
Bilirubin Urine: NEGATIVE
Glucose, UA: NEGATIVE mg/dL
HGB URINE DIPSTICK: NEGATIVE
Ketones, ur: NEGATIVE mg/dL
Leukocytes, UA: NEGATIVE
Nitrite: NEGATIVE
PROTEIN: NEGATIVE mg/dL
SPECIFIC GRAVITY, URINE: 1.01 (ref 1.005–1.030)
Urobilinogen, UA: 0.2 mg/dL (ref 0.0–1.0)
pH: 6 (ref 5.0–8.0)

## 2013-08-17 LAB — COMPREHENSIVE METABOLIC PANEL
ALT: 10 U/L (ref 0–35)
ANION GAP: 11 (ref 5–15)
AST: 13 U/L (ref 0–37)
Albumin: 3.4 g/dL — ABNORMAL LOW (ref 3.5–5.2)
Alkaline Phosphatase: 64 U/L (ref 39–117)
BUN: 10 mg/dL (ref 6–23)
CALCIUM: 8.6 mg/dL (ref 8.4–10.5)
CO2: 24 meq/L (ref 19–32)
CREATININE: 0.72 mg/dL (ref 0.50–1.10)
Chloride: 104 mEq/L (ref 96–112)
GFR calc Af Amer: >90 mL/min (ref >90)
GLUCOSE: 84 mg/dL (ref 70–99)
Potassium: 3.9 mEq/L (ref 3.7–5.3)
SODIUM: 139 meq/L (ref 137–147)
Total Bilirubin: 0.4 mg/dL (ref 0.3–1.2)
Total Protein: 6.7 g/dL (ref 6.0–8.3)

## 2013-08-17 LAB — GC/CHLAMYDIA PROBE AMP
CT Probe RNA: NEGATIVE
GC Probe RNA: NEGATIVE

## 2013-08-17 MED ORDER — IOHEXOL 300 MG/ML  SOLN
100.0000 mL | Freq: Once | INTRAMUSCULAR | Status: AC | PRN
  Administered 2013-08-17: 100 mL via INTRAVENOUS

## 2013-08-17 MED ORDER — SODIUM CHLORIDE 0.9 % IV BOLUS (SEPSIS)
1000.0000 mL | Freq: Once | INTRAVENOUS | Status: AC
  Administered 2013-08-17: 1000 mL via INTRAVENOUS

## 2013-08-17 MED ORDER — OXYCODONE-ACETAMINOPHEN 5-325 MG PO TABS: 2.0000 | ORAL_TABLET | ORAL | Status: DC | PRN

## 2013-08-17 MED ORDER — PROMETHAZINE HCL 25 MG PO TABS: 25.0000 mg | ORAL_TABLET | Freq: Four times a day (QID) | ORAL | Status: DC | PRN

## 2013-08-17 MED ORDER — HYDROMORPHONE HCL PF 1 MG/ML IJ SOLN
1.0000 mg | Freq: Once | INTRAMUSCULAR | Status: AC
  Administered 2013-08-17: 1 mg via INTRAVENOUS
  Filled 2013-08-17: qty 1

## 2013-08-17 MED ORDER — ONDANSETRON HCL 4 MG/2ML IJ SOLN
4.0000 mg | Freq: Once | INTRAMUSCULAR | Status: AC
  Administered 2013-08-17: 4 mg via INTRAVENOUS
  Filled 2013-08-17: qty 2

## 2013-08-17 NOTE — ED Notes (Signed)
Pt alert & oriented x4, stable gait. Patient given discharge instructions, paperwork & prescription(s). Patient  instructed to stop at the registration desk to finish any additional paperwork. Patient verbalized understanding. Pt left department w/ no further questions. 

## 2013-08-17 NOTE — ED Provider Notes (Addendum)
Medical screening examination/treatment/procedure(s) were conducted as a shared visit with non-physician practitioner(s) and myself.  I personally evaluated the patient during the encounter.   EKG Interpretation None      Patient here with LLQ pain, similar to yesterday. Cyst diagnosed on [place] the day prior. States worsening pain. [place] repeated to r/o torsion - no torsion for me. LLQ on exam, tearful, very anxious. Has MS contin at home for pain. Stable for discharge.  Evelina Bucy, MD 08/17/13 1118

## 2013-08-17 NOTE — Discharge Instructions (Signed)
CT scan shows no life-threatening conditions. Blood work good.   Medication for pain and nausea. Followup your Dr.

## 2013-08-17 NOTE — ED Provider Notes (Signed)
CSN: [id number]     Arrival date & time 08/17/13  1654 History   First MD Initiated Contact with Patient 08/17/13 1809     No chief complaint on file.    (Consider location/radiation/quality/duration/timing/severity/associated sxs/prior Treatment) HPI.... third visit to the emergency department since 08/14/2013 with LLQ abdominal pain. Pelvic exam and ultrasound performed initially revealing a 3.3 cm ovarian cyst.  No vaginal bleeding or discharge. Patient is eating. No vomiting or diarrhea. No fever or chills. Severity is moderate. Past Medical History  Diagnosis Date  . Headache(784.0)     migraines  . Multiple sclerosis   . Asthma as a child  . GERD (gastroesophageal reflux disease)   . Urinary urgency   . Anxiety   . Perforated bowel 2009  . Migraines   . Depression   . Chronic back pain   . Chronic pain   . Ovarian cyst    Past Surgical History  Procedure Laterality Date  . Extremity cyst excision  1994    right leg  . 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 Colostomy closure  04/2007  . Scar revision  01/21/2011    Procedure: SCAR REVISION;  Surgeon: Hermelinda Dellen;  Location: [place];  Service: Plastics;  Laterality: N/A;  exploration of scar of abdomen and repair of defect  . Hernia repair    . Abdominal surgery    . Appendectomy     History reviewed. No pertinent family history. History  Substance Use Topics  . Smoking status: Current Some Day Smoker    Types: Cigarettes  . Smokeless tobacco: Never Used     Comment: quit smoking 10 days ago  . Alcohol Use: Yes     Comment: Occ   OB History   Grav Para Term Preterm Abortions TAB SAB Ect Mult Living                 Review of Systems  All other systems reviewed and are negative.     Allergies  Amitriptyline; Baclofen; Cymbalta; Gabapentin; Monosodium glutamate; Other; Alprazolam; Magnesium salicylate; Rizatriptan; Tizanidine; Vicodin; Adhesive; and Lamotrigine  Home  Medications   Prior to Admission medications   Medication Sig Start Date End Date Taking? Authorizing Provider  carbamazepine (CARBATROL) 200 MG 12 hr capsule Take 200 mg by mouth 3 (three) times daily. 04/22/13 04/22/14 Yes Historical Provider, MD  clonazePAM (KLONOPIN) 1 MG tablet Take 1 mg by mouth 2 (two) times daily as needed for anxiety.    Yes Historical Provider, MD  divalproex (DEPAKOTE ER) 500 MG 24 hr tablet Take 500 mg by mouth daily. For depression   Yes Historical Provider, MD  doxycycline (VIBRAMYCIN) 100 MG capsule Take 1 capsule (100 mg total) by mouth 2 (two) times daily. 08/14/13  Yes Nat Christen, MD  Fingolimod HCl (GILENYA) 0.5 MG CAPS Take 1 capsule by mouth daily.   Yes Historical Provider, MD  levonorgestrel (MIRENA) 20 MCG/24HR IUD 1 each by Intrauterine route once.   Yes Historical Provider, MD  loratadine (CLARITIN) 10 MG tablet Take 10 mg by mouth daily.     Yes Historical Provider, MD  metroNIDAZOLE (FLAGYL) 500 MG tablet Take 1 tablet (500 mg total) by mouth 3 (three) times daily. 08/14/13  Yes Nat Christen, MD  morphine (MSIR) 15 MG tablet Take 15 mg by mouth 4 (four) times daily.   Yes Historical Provider, MD  Multiple Vitamins-Minerals (AIRBORNE PO) Take 1 tablet by mouth daily.   Yes  Historical Provider, MD  omeprazole (PRILOSEC) 40 MG capsule Take 40 mg by mouth daily.    Yes Historical Provider, MD  ondansetron (ZOFRAN) 8 MG tablet Take 1 tablet (8 mg total) by mouth every 4 (four) hours as needed. 08/14/13  Yes Nat Christen, MD  oxyCODONE-acetaminophen (PERCOCET) 7.5-325 MG per tablet Take 1 tablet by mouth every 4 (four) hours as needed for pain. 08/15/13  Yes Hope Bunnie Pion, NP  potassium chloride (K-DUR) 10 MEQ tablet Take 10 mEq by mouth 2 (two) times daily.   Yes Historical Provider, MD  pregabalin (LYRICA) 100 MG capsule Take 300 mg by mouth at bedtime.    Yes Historical Provider, MD  propranolol (INDERAL) 10 MG tablet Take 10 mg by mouth 2 (two) times daily.   Yes  Historical Provider, MD  ranitidine (ZANTAC) 300 MG capsule Take 300 mg by mouth every evening.     Yes Historical Provider, MD  SUMAtriptan (IMITREX) 6 MG/0.5ML SOLN injection Inject 6 mg into the skin every 2 (two) hours as needed for migraine or headache. May repeat in 2 hours if headache persists or recurs.   Yes Historical Provider, MD  topiramate (TOPAMAX) 100 MG tablet Take 100 mg by mouth 2 (two) times daily.    Yes Historical Provider, MD  EPINEPHrine (EPIPEN) 0.3 mg/0.3 mL SOAJ injection Inject 0.3 mg into the muscle once.    Historical Provider, MD  oxyCODONE-acetaminophen (PERCOCET) 5-325 MG per tablet Take 2 tablets by mouth every 4 (four) hours as needed. 08/17/13   Nat Christen, MD  promethazine (PHENERGAN) 25 MG tablet Take 1 tablet (25 mg total) by mouth every 6 (six) hours as needed. 08/17/13   Nat Christen, MD   BP 145/99  Pulse 84  Temp(Src) 98.1 F (36.7 C) (Oral)  Resp 18  Ht 5\' 5"  (1.651 m)  Wt 210 lb (95.255 kg)  BMI 34.95 kg/m2  SpO2 99%  LMP 08/08/2013 Physical Exam  Nursing note and vitals reviewed. Constitutional: She is oriented to person, place, and time. She appears well-developed and well-nourished.  HENT:  Head: Normocephalic and atraumatic.  Eyes: Conjunctivae and EOM are normal. Pupils are equal, round, and reactive to light.  Neck: Normal range of motion. Neck supple.  Cardiovascular: Normal rate, regular rhythm and normal heart sounds.   Pulmonary/Chest: Effort normal and breath sounds normal.  Abdominal: Soft. Bowel sounds are normal.  Minimal left lower quadrant tenderness   Musculoskeletal: Normal range of motion.  Neurological: She is alert and oriented to person, place, and time.  Skin: Skin is warm and dry.  Psychiatric: She has a normal mood and affect. Her behavior is normal.    ED Course  Procedures (including critical care time) Labs Review Labs Reviewed  COMPREHENSIVE METABOLIC PANEL - Abnormal; Notable for the following:    Albumin  3.4 (*)    All other components within normal limits  CBC WITH DIFFERENTIAL  URINALYSIS, ROUTINE W REFLEX MICROSCOPIC    Imaging Review Ct Abdomen Pelvis W Contrast  08/17/2013   CLINICAL DATA:  Lower abdominal pain  EXAM: CT ABDOMEN AND PELVIS WITH CONTRAST  TECHNIQUE: Multidetector CT imaging of the abdomen and pelvis was performed using the standard protocol following bolus administration of intravenous contrast. Oral contrast was also administered.  CONTRAST:  123mL OMNIPAQUE IOHEXOL 300 MG/ML  SOLN  COMPARISON:  April 07, 2013 CT abdomen and pelvis ; pelvic ultrasound August 15, 2013  FINDINGS: Lung bases are clear.  There is evidence of fatty change in  the liver. No focal liver lesions are identified. The gallbladder wall is not thickened. There is no appreciable biliary duct dilatation.  Spleen, pancreas, and adrenals appear normal. Kidneys bilaterally show no mass or hydronephrosis. There is no renal or ureteral calculus on either side.  The previously noted thickening in the soft tissues posterior to the mid lumbar paraspinous muscles is again noted, concerning for chronic panniculitis in this area.  In the pelvis, there is an intrauterine device within the uterus. A small amount of free fluid is seen between the uterus and rectum. No pelvic masses appreciable by CT. Appendix is absent. There is no appreciable diverticular disease. There is postoperative change in the region of the sigmoid colon, a finding noted previously.  Patient is status post ventral hernia repair. There is scarring in the periumbilical region.  There is no bowel obstruction.  No free air or portal venous air.  There is no appreciable adenopathy or abscess in the abdomen or pelvis. There is no abdominal aortic aneurysm or periaortic fluid. There are no blastic or lytic bone lesions.  IMPRESSION: Persistent thickening in the fat posterior to the mid lumbar paraspinous muscles. Suspect a degree of chronic panniculitis in this area.   Scarring in the periumbilical region consistent with repair of ventral hernia previously.  Small amount of free fluid in cul-de-sac. Question recent ovarian cyst rupture. Intrauterine device is present within the uterus.  No bowel obstruction. No abscess. Appendix absent. No renal or ureteral calculus. No hydronephrosis.  Fatty liver.   Electronically Signed   By: Lowella Grip M.D.   On: 08/17/2013 20:32     EKG Interpretation None      MDM   Final diagnoses:  Left lower quadrant pain    Third visit to the emergency department in 4 days. CT scan obtained which showed a small amount of free fluid in the cul-de-sac. No other acute pathology. No acute abdomen. Patient has gynecological followup. Discharge medications percocet and phenergan 25 mg    Nat Christen, MD 08/17/13 2144

## 2013-08-17 NOTE — ED Notes (Signed)
Pain LLQ   Dx with ovarian cyst.  Increased pain today

## 2013-08-19 ENCOUNTER — Emergency Department (HOSPITAL_COMMUNITY)
Admission: EM | Admit: 2013-08-19 | Discharge: 2013-08-19 | Disposition: A | Discharge status: Home / Self Care | Payer: Medicare Other | Attending: Emergency Medicine | Admitting: Emergency Medicine

## 2013-08-19 ENCOUNTER — Encounter (HOSPITAL_COMMUNITY): Payer: Self-pay | Admitting: Emergency Medicine

## 2013-08-19 DIAGNOSIS — J45909 Unspecified asthma, uncomplicated: Secondary | ICD-10-CM | POA: Diagnosis not present

## 2013-08-19 DIAGNOSIS — Z8742 Personal history of other diseases of the female genital tract: Secondary | ICD-10-CM | POA: Diagnosis not present

## 2013-08-19 DIAGNOSIS — Z79899 Other long term (current) drug therapy: Secondary | ICD-10-CM | POA: Insufficient documentation

## 2013-08-19 DIAGNOSIS — G8929 Other chronic pain: Secondary | ICD-10-CM | POA: Diagnosis not present

## 2013-08-19 DIAGNOSIS — F3289 Other specified depressive episodes: Secondary | ICD-10-CM | POA: Diagnosis not present

## 2013-08-19 DIAGNOSIS — Z9889 Other specified postprocedural states: Secondary | ICD-10-CM | POA: Diagnosis not present

## 2013-08-19 DIAGNOSIS — R109 Unspecified abdominal pain: Secondary | ICD-10-CM | POA: Diagnosis not present

## 2013-08-19 DIAGNOSIS — F329 Major depressive disorder, single episode, unspecified: Secondary | ICD-10-CM | POA: Insufficient documentation

## 2013-08-19 DIAGNOSIS — G35 Multiple sclerosis: Secondary | ICD-10-CM | POA: Insufficient documentation

## 2013-08-19 DIAGNOSIS — Z792 Long term (current) use of antibiotics: Secondary | ICD-10-CM | POA: Diagnosis not present

## 2013-08-19 DIAGNOSIS — K219 Gastro-esophageal reflux disease without esophagitis: Secondary | ICD-10-CM | POA: Insufficient documentation

## 2013-08-19 DIAGNOSIS — F17200 Nicotine dependence, unspecified, uncomplicated: Secondary | ICD-10-CM | POA: Insufficient documentation

## 2013-08-19 DIAGNOSIS — F411 Generalized anxiety disorder: Secondary | ICD-10-CM | POA: Diagnosis not present

## 2013-08-19 DIAGNOSIS — R102 Pelvic and perineal pain: Secondary | ICD-10-CM

## 2013-08-19 DIAGNOSIS — R51 Headache: Secondary | ICD-10-CM | POA: Diagnosis not present

## 2013-08-19 DIAGNOSIS — G43909 Migraine, unspecified, not intractable, without status migrainosus: Secondary | ICD-10-CM | POA: Diagnosis not present

## 2013-08-19 MED ORDER — ONDANSETRON 4 MG PO TBDP
4.0000 mg | ORAL_TABLET | Freq: Once | ORAL | Status: AC
  Administered 2013-08-19: 4 mg via ORAL
  Filled 2013-08-19: qty 1

## 2013-08-19 MED ORDER — HYDROMORPHONE HCL PF 2 MG/ML IJ SOLN
2.0000 mg | Freq: Once | INTRAMUSCULAR | Status: AC
  Administered 2013-08-19: 2 mg via INTRAMUSCULAR
  Filled 2013-08-19: qty 1

## 2013-08-19 NOTE — ED Notes (Signed)
Pt lower abd pain and was dx with ovarian cyst. Pt c/o pelvic, abd , and head pain. Pt seen obgyn yesterday.

## 2013-08-19 NOTE — Discharge Instructions (Signed)
Make an appointment with Dr. Johnnye Sima off [facility] here in [place]. In the meantime would recommend following up with your primary care Dr. Sinclair Grooms take your pain medication as directed.

## 2013-08-19 NOTE — ED Provider Notes (Addendum)
CSN: [id number]     Arrival date & time 08/19/13  0239 History   First MD Initiated Contact with Patient 08/19/13 0253     Chief Complaint  Patient presents with  . Abdominal Pain     (Consider location/radiation/quality/duration/timing/severity/associated sxs/prior Treatment) Patient is a 33 y.o. female presenting with abdominal pain. The history is provided by the patient.  Abdominal Pain Associated symptoms: nausea   Associated symptoms: no chest pain, no dysuria, no fever and no shortness of breath    patient has been evaluated for this complaint of pelvic pain on August 8 ninth and 11th. Patient's had 2 ultrasounds of the pelvic area as well as CT scan of the abdomen and pelvis with only findings for concern for ovarian cyst or ovarian mass. Patient seen yesterday by her OB/GYN. Patient did not get resolution from that visit. Patient wants her IUD removed. For some reason OB/GYN did not want to remove it. Patient with persistent pain. Patient does have Percocet pain medication available at home. Patient also has a history of MS and other chronic pain issues. Symptoms associated with nausea. No change in symptoms from the previous visits. Patient's had extensive workup including lab in the emergency department for this complaint.  Past Medical History  Diagnosis Date  . Headache(784.0)     migraines  . Multiple sclerosis   . Asthma as a child  . GERD (gastroesophageal reflux disease)   . Urinary urgency   . Anxiety   . Perforated bowel 2009  . Migraines   . Depression   . Chronic back pain   . Chronic pain   . Ovarian cyst    Past Surgical History  Procedure Laterality Date  . Extremity cyst excision  1994    right leg  . 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 Colostomy closure  04/2007  . Scar revision  01/21/2011    Procedure: SCAR REVISION;  Surgeon: Hermelinda Dellen;  Location: [place];  Service: Plastics;  Laterality: N/A;  exploration of scar  of abdomen and repair of defect  . Hernia repair    . Abdominal surgery    . Appendectomy     History reviewed. No pertinent family history. History  Substance Use Topics  . Smoking status: Current Some Day Smoker    Types: Cigarettes  . Smokeless tobacco: Never Used     Comment: quit smoking 10 days ago  . Alcohol Use: Yes     Comment: Occ   OB History   Grav Para Term Preterm Abortions TAB SAB Ect Mult Living                 Review of Systems  Constitutional: Negative for fever.  HENT: Negative for congestion.   Eyes: Negative for redness.  Respiratory: Negative for shortness of breath.   Cardiovascular: Negative for chest pain.  Gastrointestinal: Positive for nausea and abdominal pain.  Genitourinary: Positive for pelvic pain. Negative for dysuria.  Musculoskeletal: Negative for back pain.  Skin: Negative for rash.  Neurological: Positive for headaches.  Hematological: Does not bruise/bleed easily.  Psychiatric/Behavioral: Negative for confusion.      Allergies  Amitriptyline; Baclofen; Cymbalta; Gabapentin; Monosodium glutamate; Other; Alprazolam; Magnesium salicylate; Rizatriptan; Tizanidine; Vicodin; Adhesive; and Lamotrigine  Home Medications   Prior to Admission medications   Medication Sig Start Date End Date Taking? Authorizing Provider  carbamazepine (CARBATROL) 200 MG 12 hr capsule Take 200 mg by mouth 3 (three) times daily.  04/22/13 04/22/14  Historical Provider, MD  clonazePAM (KLONOPIN) 1 MG tablet Take 1 mg by mouth 2 (two) times daily as needed for anxiety.     Historical Provider, MD  divalproex (DEPAKOTE ER) 500 MG 24 hr tablet Take 500 mg by mouth daily. For depression    Historical Provider, MD  doxycycline (VIBRAMYCIN) 100 MG capsule Take 1 capsule (100 mg total) by mouth 2 (two) times daily. 08/14/13   Nat Christen, MD  EPINEPHrine (EPIPEN) 0.3 mg/0.3 mL SOAJ injection Inject 0.3 mg into the muscle once.    Historical Provider, MD  Fingolimod HCl  (GILENYA) 0.5 MG CAPS Take 1 capsule by mouth daily.    Historical Provider, MD  levonorgestrel (MIRENA) 20 MCG/24HR IUD 1 each by Intrauterine route once.    Historical Provider, MD  loratadine (CLARITIN) 10 MG tablet Take 10 mg by mouth daily.      Historical Provider, MD  metroNIDAZOLE (FLAGYL) 500 MG tablet Take 1 tablet (500 mg total) by mouth 3 (three) times daily. 08/14/13   Nat Christen, MD  morphine (MSIR) 15 MG tablet Take 15 mg by mouth 4 (four) times daily.    Historical Provider, MD  Multiple Vitamins-Minerals (AIRBORNE PO) Take 1 tablet by mouth daily.    Historical Provider, MD  omeprazole (PRILOSEC) 40 MG capsule Take 40 mg by mouth daily.     Historical Provider, MD  ondansetron (ZOFRAN) 8 MG tablet Take 1 tablet (8 mg total) by mouth every 4 (four) hours as needed. 08/14/13   Nat Christen, MD  oxyCODONE-acetaminophen (PERCOCET) 5-325 MG per tablet Take 2 tablets by mouth every 4 (four) hours as needed. 08/17/13   Nat Christen, MD  oxyCODONE-acetaminophen (PERCOCET) 7.5-325 MG per tablet Take 1 tablet by mouth every 4 (four) hours as needed for pain. 08/15/13   Hope Bunnie Pion, NP  potassium chloride (K-DUR) 10 MEQ tablet Take 10 mEq by mouth 2 (two) times daily.    Historical Provider, MD  pregabalin (LYRICA) 100 MG capsule Take 300 mg by mouth at bedtime.     Historical Provider, MD  promethazine (PHENERGAN) 25 MG tablet Take 1 tablet (25 mg total) by mouth every 6 (six) hours as needed. 08/17/13   Nat Christen, MD  propranolol (INDERAL) 10 MG tablet Take 10 mg by mouth 2 (two) times daily.    Historical Provider, MD  ranitidine (ZANTAC) 300 MG capsule Take 300 mg by mouth every evening.      Historical Provider, MD  SUMAtriptan (IMITREX) 6 MG/0.5ML SOLN injection Inject 6 mg into the skin every 2 (two) hours as needed for migraine or headache. May repeat in 2 hours if headache persists or recurs.    Historical Provider, MD  topiramate (TOPAMAX) 100 MG tablet Take 100 mg by mouth 2 (two) times daily.      Historical Provider, MD   BP 161/115  Pulse 97  Temp(Src) 98 F (36.7 C) (Oral)  Resp 18  Ht 5\' 5"  (1.651 m)  Wt 210 lb (95.255 kg)  BMI 34.95 kg/m2  SpO2 100%  LMP 08/08/2013 Physical Exam  Nursing note and vitals reviewed. Constitutional: She is oriented to person, place, and time. She appears well-developed and well-nourished. No distress.  HENT:  Head: Normocephalic and atraumatic.  Mouth/Throat: Oropharynx is clear and moist.  Eyes: Conjunctivae and EOM are normal. Pupils are equal, round, and reactive to light.  Neck: Normal range of motion.  Cardiovascular: Normal rate, regular rhythm and normal heart sounds.   Pulmonary/Chest: Effort  normal and breath sounds normal. No respiratory distress.  Abdominal: Soft. Bowel sounds are normal. There is tenderness.  Tenderness to palpation in the left lower quadrant. No guarding.  Musculoskeletal: Normal range of motion. She exhibits no edema.  Neurological: She is alert and oriented to person, place, and time. No cranial nerve deficit. She exhibits normal muscle tone. Coordination normal.  Skin: Skin is warm. No rash noted.    ED Course  Procedures (including critical care time) Labs Review Labs Reviewed - No data to display  Imaging Review Ct Abdomen Pelvis W Contrast  08/17/2013   CLINICAL DATA:  Lower abdominal pain  EXAM: CT ABDOMEN AND PELVIS WITH CONTRAST  TECHNIQUE: Multidetector CT imaging of the abdomen and pelvis was performed using the standard protocol following bolus administration of intravenous contrast. Oral contrast was also administered.  CONTRAST:  11mL OMNIPAQUE IOHEXOL 300 MG/ML  SOLN  COMPARISON:  April 07, 2013 CT abdomen and pelvis ; pelvic ultrasound August 15, 2013  FINDINGS: Lung bases are clear.  There is evidence of fatty change in the liver. No focal liver lesions are identified. The gallbladder wall is not thickened. There is no appreciable biliary duct dilatation.  Spleen, pancreas, and adrenals  appear normal. Kidneys bilaterally show no mass or hydronephrosis. There is no renal or ureteral calculus on either side.  The previously noted thickening in the soft tissues posterior to the mid lumbar paraspinous muscles is again noted, concerning for chronic panniculitis in this area.  In the pelvis, there is an intrauterine device within the uterus. A small amount of free fluid is seen between the uterus and rectum. No pelvic masses appreciable by CT. Appendix is absent. There is no appreciable diverticular disease. There is postoperative change in the region of the sigmoid colon, a finding noted previously.  Patient is status post ventral hernia repair. There is scarring in the periumbilical region.  There is no bowel obstruction.  No free air or portal venous air.  There is no appreciable adenopathy or abscess in the abdomen or pelvis. There is no abdominal aortic aneurysm or periaortic fluid. There are no blastic or lytic bone lesions.  IMPRESSION: Persistent thickening in the fat posterior to the mid lumbar paraspinous muscles. Suspect a degree of chronic panniculitis in this area.  Scarring in the periumbilical region consistent with repair of ventral hernia previously.  Small amount of free fluid in cul-de-sac. Question recent ovarian cyst rupture. Intrauterine device is present within the uterus.  No bowel obstruction. No abscess. Appendix absent. No renal or ureteral calculus. No hydronephrosis.  Fatty liver.   Electronically Signed   By: Lowella Grip M.D.   On: 08/17/2013 20:32     EKG Interpretation None      MDM   Final diagnoses:  Pelvic pain in female    Patient with persistent pelvic pain. Patient been seen and evaluated in the emergency department on August 8 ninth and 11th for same type of pain. Patient was seen by her OB/GYN doctor yesterday without a lot of resolution. Patient had ultrasounds and CT scans done of her abdomen here in the emergency department with only findings  of concern for possible ovarian cyst or mass. Patient arrived here tonight requesting IUD to be removed. Prior scans and CT haven't shown any particular abnormality with the IUD. Patient was informed that needs to be done by OB/GYN. Also not certain that this has anything to do with her pain. Patient given a shot of hydromorphone. Patient has  pain medication at home already. [facility] Johnnye Sima office. Patient also has a primary care Dr. to followup with in the meantime. Today symptoms aren't any different than the symptoms that brought her to the emergency department with the prior visits here in August. Patient's had extensive workups in the past and also to include labs without any significant abnormalities.  Previous CT scan ultrasound and lab results reviewed.  Fredia Sorrow, MD 08/19/13 [place], MD 08/19/13 [phone]

## 2013-08-28 ENCOUNTER — Encounter (HOSPITAL_COMMUNITY): Payer: Self-pay | Admitting: Emergency Medicine

## 2013-08-28 ENCOUNTER — Emergency Department (HOSPITAL_COMMUNITY)
Admission: EM | Admit: 2013-08-28 | Discharge: 2013-08-28 | Discharge status: Against Medical Advice | Payer: Medicare Other | Attending: Emergency Medicine | Admitting: Emergency Medicine

## 2013-08-28 DIAGNOSIS — R109 Unspecified abdominal pain: Secondary | ICD-10-CM | POA: Insufficient documentation

## 2013-08-28 DIAGNOSIS — G8929 Other chronic pain: Secondary | ICD-10-CM | POA: Diagnosis not present

## 2013-08-28 DIAGNOSIS — F17200 Nicotine dependence, unspecified, uncomplicated: Secondary | ICD-10-CM | POA: Diagnosis not present

## 2013-08-28 LAB — URINALYSIS, ROUTINE W REFLEX MICROSCOPIC
Bilirubin Urine: NEGATIVE
GLUCOSE, UA: NEGATIVE mg/dL
Hgb urine dipstick: NEGATIVE
KETONES UR: NEGATIVE mg/dL
LEUKOCYTES UA: NEGATIVE
NITRITE: NEGATIVE
PROTEIN: NEGATIVE mg/dL
Specific Gravity, Urine: 1.025 (ref 1.005–1.030)
Urobilinogen, UA: 0.2 mg/dL (ref 0.0–1.0)
pH: 6 (ref 5.0–8.0)

## 2013-08-28 LAB — PREGNANCY, URINE: PREG TEST UR: NEGATIVE

## 2013-08-28 MED ORDER — OXYCODONE-ACETAMINOPHEN 5-325 MG PO TABS
1.0000 | ORAL_TABLET | Freq: Once | ORAL | Status: AC
  Administered 2013-08-28: 1 via ORAL
  Filled 2013-08-28: qty 1

## 2013-08-28 NOTE — ED Notes (Signed)
Patient reported wanted to leave AMA prior to being seen by ED doctor. Stated did not want to wait any longer to be seen. Patient stated would be leaving and going to [facility] ED to be seen.

## 2013-08-28 NOTE — ED Notes (Signed)
I have an ovarian cyst that I had rupture and I have another that may have ruptured per pt. The pain started this morning and it has became worse per pt.

## 2013-08-28 NOTE — ED Notes (Signed)
Updated pt and family member on wait time. Pain medicine given.

## 2013-09-02 ENCOUNTER — Emergency Department (HOSPITAL_COMMUNITY)
Admission: EM | Admit: 2013-09-02 | Discharge: 2013-09-02 | Disposition: A | Discharge status: Home / Self Care | Payer: Medicare Other | Attending: Emergency Medicine | Admitting: Emergency Medicine

## 2013-09-02 ENCOUNTER — Encounter (HOSPITAL_COMMUNITY): Payer: Self-pay | Admitting: Emergency Medicine

## 2013-09-02 DIAGNOSIS — R197 Diarrhea, unspecified: Secondary | ICD-10-CM | POA: Diagnosis not present

## 2013-09-02 DIAGNOSIS — R51 Headache: Secondary | ICD-10-CM | POA: Diagnosis present

## 2013-09-02 DIAGNOSIS — Z8742 Personal history of other diseases of the female genital tract: Secondary | ICD-10-CM | POA: Diagnosis not present

## 2013-09-02 DIAGNOSIS — F17200 Nicotine dependence, unspecified, uncomplicated: Secondary | ICD-10-CM | POA: Insufficient documentation

## 2013-09-02 DIAGNOSIS — G8929 Other chronic pain: Secondary | ICD-10-CM | POA: Diagnosis not present

## 2013-09-02 DIAGNOSIS — Z792 Long term (current) use of antibiotics: Secondary | ICD-10-CM | POA: Insufficient documentation

## 2013-09-02 DIAGNOSIS — R42 Dizziness and giddiness: Secondary | ICD-10-CM | POA: Insufficient documentation

## 2013-09-02 DIAGNOSIS — F411 Generalized anxiety disorder: Secondary | ICD-10-CM | POA: Diagnosis not present

## 2013-09-02 DIAGNOSIS — F329 Major depressive disorder, single episode, unspecified: Secondary | ICD-10-CM | POA: Diagnosis not present

## 2013-09-02 DIAGNOSIS — J45909 Unspecified asthma, uncomplicated: Secondary | ICD-10-CM | POA: Diagnosis not present

## 2013-09-02 DIAGNOSIS — G43909 Migraine, unspecified, not intractable, without status migrainosus: Secondary | ICD-10-CM

## 2013-09-02 DIAGNOSIS — Z79899 Other long term (current) drug therapy: Secondary | ICD-10-CM | POA: Diagnosis not present

## 2013-09-02 DIAGNOSIS — H9209 Otalgia, unspecified ear: Secondary | ICD-10-CM | POA: Insufficient documentation

## 2013-09-02 DIAGNOSIS — F3289 Other specified depressive episodes: Secondary | ICD-10-CM | POA: Diagnosis not present

## 2013-09-02 DIAGNOSIS — K219 Gastro-esophageal reflux disease without esophagitis: Secondary | ICD-10-CM | POA: Insufficient documentation

## 2013-09-02 DIAGNOSIS — I10 Essential (primary) hypertension: Secondary | ICD-10-CM | POA: Insufficient documentation

## 2013-09-02 MED ORDER — DIPHENHYDRAMINE HCL 50 MG/ML IJ SOLN
25.0000 mg | Freq: Once | INTRAMUSCULAR | Status: AC
  Administered 2013-09-02: 25 mg via INTRAVENOUS
  Filled 2013-09-02: qty 1

## 2013-09-02 MED ORDER — METOCLOPRAMIDE HCL 5 MG/ML IJ SOLN
10.0000 mg | Freq: Once | INTRAMUSCULAR | Status: AC
  Administered 2013-09-02: 10 mg via INTRAVENOUS
  Filled 2013-09-02: qty 2

## 2013-09-02 NOTE — Discharge Instructions (Signed)

## 2013-09-02 NOTE — ED Notes (Addendum)
Pt co migraine that started 3 days ago, prescribed meds not touching the pain for pt. Pt takes 15mg  morphine. Pt also co nausea. Light and sound sensitivity noted. Pt also co dizziness.

## 2013-09-02 NOTE — ED Provider Notes (Signed)
CSN: [id number]     Arrival date & time 09/02/13  1430 History   First MD Initiated Contact with Patient 09/02/13 1521     Chief Complaint  Patient presents with  . Migraine      Patient is a 33 y.o. female presenting with migraines. The history is provided by the patient.  Migraine This is a recurrent problem. The current episode started more than 2 days ago. The problem occurs constantly. The problem has been gradually worsening. Associated symptoms include headaches. Pertinent negatives include no chest pain. Nothing aggravates the symptoms. Nothing relieves the symptoms.   Pt reports onset of HA gradually 3 days ago Similar to prior headaches No trauma No fever She reports associated vomiting/diarrhea No fever No focal weakness/numnbess No visual changes reported Denies h/o CVA Past Medical History  Diagnosis Date  . Headache(784.0)     migraines  . Multiple sclerosis   . Asthma as a child  . GERD (gastroesophageal reflux disease)   . Urinary urgency   . Anxiety   . Perforated bowel 2009  . Migraines   . Depression   . Chronic back pain   . Chronic pain   . Ovarian cyst    Past Surgical History  Procedure Laterality Date  . Extremity cyst excision  1994    right leg  . 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 Colostomy closure  04/2007  . Scar revision  01/21/2011    Procedure: SCAR REVISION;  Surgeon: Hermelinda Dellen;  Location: [place] [facility];  Service: Plastics;  Laterality: N/A;  exploration of scar of abdomen and repair of defect  . Hernia repair    . Abdominal surgery    . Appendectomy     History reviewed. No pertinent family history. History  Substance Use Topics  . Smoking status: Current Some Day Smoker    Types: Cigarettes  . Smokeless tobacco: Never Used     Comment: quit smoking 10 days ago  . Alcohol Use: Yes     Comment: Occ   OB History   Grav Para Term Preterm Abortions TAB SAB Ect Mult Living                  Review of Systems  Constitutional: Negative for fever.  HENT: Positive for ear pain.   Eyes: Negative for visual disturbance.  Cardiovascular: Negative for chest pain.  Gastrointestinal: Positive for vomiting and diarrhea.  Neurological: Positive for dizziness and headaches. Negative for weakness.  All other systems reviewed and are negative.     Allergies  Amitriptyline; Baclofen; Cymbalta; Gabapentin; Monosodium glutamate; Other; Vicodin; Alprazolam; Magnesium salicylate; Rizatriptan; Tizanidine; Adhesive; and Lamotrigine  Home Medications   Prior to Admission medications   Medication Sig Start Date End Date Taking? Authorizing Provider  carbamazepine (CARBATROL) 200 MG 12 hr capsule Take 200 mg by mouth 3 (three) times daily. 04/22/13 04/22/14  Historical Provider, MD  clonazePAM (KLONOPIN) 1 MG tablet Take 1 mg by mouth 2 (two) times daily as needed for anxiety.     Historical Provider, MD  divalproex (DEPAKOTE ER) 500 MG 24 hr tablet Take 500 mg by mouth every morning. For depression    Historical Provider, MD  doxycycline (VIBRA-TABS) 100 MG tablet Take 100 mg by mouth 2 (two) times daily.    Historical Provider, MD  EPINEPHrine (EPIPEN) 0.3 mg/0.3 mL SOAJ injection Inject 0.3 mg into the muscle once.    Historical Provider, MD  Fingolimod HCl (GILENYA)  0.5 MG CAPS Take 1 capsule by mouth every morning.     Historical Provider, MD  levonorgestrel (MIRENA) 20 MCG/24HR IUD 1 each by Intrauterine route once.    Historical Provider, MD  loratadine (CLARITIN) 10 MG tablet Take 10 mg by mouth every morning.     Historical Provider, MD  metroNIDAZOLE (FLAGYL) 500 MG tablet Take 500 mg by mouth 3 (three) times daily.    Historical Provider, MD  morphine (MSIR) 15 MG tablet Take 15 mg by mouth 4 (four) times daily.    Historical Provider, MD  Multiple Vitamins-Minerals (AIRBORNE PO) Take 1 tablet by mouth daily as needed (for allergies).     Historical Provider, MD  omeprazole (PRILOSEC)  40 MG capsule Take 40 mg by mouth every morning.     Historical Provider, MD  ondansetron (ZOFRAN) 8 MG tablet Take 1 tablet (8 mg total) by mouth every 4 (four) hours as needed. 08/14/13   Nat Christen, MD  potassium chloride (K-DUR) 10 MEQ tablet Take 10 mEq by mouth 2 (two) times daily.    Historical Provider, MD  pregabalin (LYRICA) 100 MG capsule Take 300 mg by mouth at bedtime.     Historical Provider, MD  promethazine (PHENERGAN) 25 MG tablet Take 1 tablet (25 mg total) by mouth every 6 (six) hours as needed. 08/17/13   Nat Christen, MD  propranolol (INDERAL) 10 MG tablet Take 10 mg by mouth 2 (two) times daily.    Historical Provider, MD  ranitidine (ZANTAC) 300 MG capsule Take 300 mg by mouth every evening.      Historical Provider, MD  SUMAtriptan (IMITREX) 6 MG/0.5ML SOLN injection Inject 6 mg into the skin every 2 (two) hours as needed for migraine or headache. May repeat in 2 hours if headache persists or recurs.    Historical Provider, MD  topiramate (TOPAMAX) 100 MG tablet Take 100 mg by mouth 2 (two) times daily.     Historical Provider, MD   BP 157/111  Pulse 100  Temp(Src) 98.9 F (37.2 C) (Oral)  Resp 18  Ht 5\' 5"  (1.651 m)  Wt 200 lb (90.719 kg)  BMI 33.28 kg/m2  SpO2 100%  LMP 08/08/2013 Physical Exam CONSTITUTIONAL: Well developed/well nourished HEAD: Normocephalic/atraumatic EYES: EOMI/PERRL, no nystagmus, no ptosis, normal fundoscopic exam (no papilledema)  ENMT: Mucous membranes moist, left TM/right TM intact and no erythema NECK: supple no meningeal signs, no bruits SPINE:entire spine nontender CV: S1/S2 noted, no murmurs/rubs/gallops noted LUNGS: Lungs are clear to auscultation bilaterally, no apparent distress ABDOMEN: soft, nontender, no rebound or guarding GU:no cva tenderness NEURO:Awake/alert, facies symmetric, no arm or leg drift is noted Equal 5/5 strength with shoulder abduction, elbow flex/extension, wrist flex/extension in upper extremities and equal hand  grips bilaterally Equal 5/5 strength with hip flexion,knee flex/extension, foot dorsi/plantar flexion Cranial nerves 3/4/5/6/07/15/08/11/12 tested and intact Gait normal without ataxia No past pointing Sensation to light touch intact in all extremities EXTREMITIES: pulses normal, full ROM SKIN: warm, color normal PSYCH: no abnormalities of mood noted   ED Course  Procedures  MDM  3:58 PM I doubt acute neurologic process Neuro exam unremarkable She will need f/u for HTN (she has had this previously)  Pt feels improved at time of discharge BP 140/105  Pulse 70  Temp(Src) 98.4 F (36.9 C) (Oral)  Resp 18  Ht 5\' 5"  (1.651 m)  Wt 200 lb (90.719 kg)  BMI 33.28 kg/m2  SpO2 99%  LMP 08/08/2013  Final diagnoses:  Migraine without status  migrainosus, not intractable, unspecified migraine type  Essential hypertension    Nursing notes including past medical history and social history reviewed and considered in documentation Previous records reviewed and considered     Sharyon Cable, MD 09/02/13 [phone]

## 2013-09-06 ENCOUNTER — Emergency Department (HOSPITAL_COMMUNITY)
Admission: EM | Admit: 2013-09-06 | Discharge: 2013-09-06 | Disposition: A | Discharge status: Home / Self Care | Payer: Medicare Other | Attending: Emergency Medicine | Admitting: Emergency Medicine

## 2013-09-06 ENCOUNTER — Encounter (HOSPITAL_COMMUNITY): Payer: Self-pay | Admitting: Emergency Medicine

## 2013-09-06 DIAGNOSIS — F411 Generalized anxiety disorder: Secondary | ICD-10-CM | POA: Diagnosis not present

## 2013-09-06 DIAGNOSIS — H539 Unspecified visual disturbance: Secondary | ICD-10-CM | POA: Diagnosis not present

## 2013-09-06 DIAGNOSIS — G8929 Other chronic pain: Secondary | ICD-10-CM | POA: Diagnosis not present

## 2013-09-06 DIAGNOSIS — G43B0 Ophthalmoplegic migraine, not intractable: Secondary | ICD-10-CM

## 2013-09-06 DIAGNOSIS — R110 Nausea: Secondary | ICD-10-CM | POA: Diagnosis not present

## 2013-09-06 DIAGNOSIS — F17200 Nicotine dependence, unspecified, uncomplicated: Secondary | ICD-10-CM | POA: Insufficient documentation

## 2013-09-06 DIAGNOSIS — J45909 Unspecified asthma, uncomplicated: Secondary | ICD-10-CM | POA: Diagnosis not present

## 2013-09-06 DIAGNOSIS — Z8742 Personal history of other diseases of the female genital tract: Secondary | ICD-10-CM | POA: Insufficient documentation

## 2013-09-06 DIAGNOSIS — G43809 Other migraine, not intractable, without status migrainosus: Secondary | ICD-10-CM | POA: Insufficient documentation

## 2013-09-06 DIAGNOSIS — K219 Gastro-esophageal reflux disease without esophagitis: Secondary | ICD-10-CM | POA: Diagnosis not present

## 2013-09-06 DIAGNOSIS — R51 Headache: Secondary | ICD-10-CM | POA: Insufficient documentation

## 2013-09-06 DIAGNOSIS — Z79899 Other long term (current) drug therapy: Secondary | ICD-10-CM | POA: Diagnosis not present

## 2013-09-06 DIAGNOSIS — R197 Diarrhea, unspecified: Secondary | ICD-10-CM | POA: Insufficient documentation

## 2013-09-06 DIAGNOSIS — F3289 Other specified depressive episodes: Secondary | ICD-10-CM | POA: Diagnosis not present

## 2013-09-06 DIAGNOSIS — F329 Major depressive disorder, single episode, unspecified: Secondary | ICD-10-CM | POA: Diagnosis not present

## 2013-09-06 MED ORDER — HYDROMORPHONE HCL PF 1 MG/ML IJ SOLN
1.0000 mg | Freq: Once | INTRAMUSCULAR | Status: AC
  Administered 2013-09-06: 1 mg via INTRAVENOUS
  Filled 2013-09-06: qty 1

## 2013-09-06 MED ORDER — PROCHLORPERAZINE EDISYLATE 5 MG/ML IJ SOLN
10.0000 mg | Freq: Once | INTRAMUSCULAR | Status: AC
  Administered 2013-09-06: 10 mg via INTRAVENOUS
  Filled 2013-09-06: qty 2

## 2013-09-06 MED ORDER — DEXAMETHASONE SODIUM PHOSPHATE 4 MG/ML IJ SOLN
8.0000 mg | Freq: Once | INTRAMUSCULAR | Status: AC
  Administered 2013-09-06: 8 mg via INTRAVENOUS
  Filled 2013-09-06: qty 2

## 2013-09-06 MED ORDER — SODIUM CHLORIDE 0.9 % IV SOLN
INTRAVENOUS | Status: DC
  Administered 2013-09-06: 18:00:00 via INTRAVENOUS

## 2013-09-06 NOTE — ED Notes (Signed)
Pt c/o migraine headache since last week.  Reports was seen here last week for same.  C/O pressure behind R eye since Friday.  Reports nausea, some diarrhea.

## 2013-09-06 NOTE — ED Notes (Signed)
Having headache for 6 days.  Took Imitrex and morphine 15 mg  Had relief for 2 hour and headache came back.  Pain is more on the right side of head and having vision issues Blurred  to right eye, with pressure behind eye.  C/o dizziness and nauseous.

## 2013-09-06 NOTE — ED Provider Notes (Signed)
CSN: [id number]     Arrival date & time 09/06/13  1354  This chart was scribed for Dorie Rank, MD by Lowella Petties, ED Scribe. The patient was seen in room [facility]12/[facility]12. Patient's care was started at 5:17 PM.    Chief Complaint  Patient presents with  . Headache   The history is provided by the patient. No language interpreter was used.   HPI Comments: Emily [name] is a 33 y.o. female who presents to the Emergency Department complaining of a migraine headache to the front of her head and the right eye which began last week. She reports a history of migraines, multiple sclerosis, and pain in her right eye. She denies experiencing pain this severe in the past. She reports blurry vision in her right eye. She reports nausea and diarrhea. She denies vomiting and fever. She states that she saw her PCP for these symptoms and gave her a shot with no relief. She reports taking Lyrica, Topamax, morphine, and Gilenya for MS.   Past Medical History  Diagnosis Date  . Headache(784.0)     migraines  . Multiple sclerosis   . Asthma as a child  . GERD (gastroesophageal reflux disease)   . Urinary urgency   . Anxiety   . Perforated bowel 2009  . Migraines   . Depression   . Chronic back pain   . Chronic pain   . Ovarian cyst    Past Surgical History  Procedure Laterality Date  . Extremity cyst excision  1994    right leg  . 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 Colostomy closure  04/2007  . Scar revision  01/21/2011    Procedure: SCAR REVISION;  Surgeon: Hermelinda Dellen;  Location: [facility];  Service: Plastics;  Laterality: N/A;  exploration of scar of abdomen and repair of defect  . Hernia repair    . Abdominal surgery    . Appendectomy     No family history on file. History  Substance Use Topics  . Smoking status: Current Some Day Smoker    Types: Cigarettes  . Smokeless tobacco: Never Used     Comment: quit smoking 10 days ago  . Alcohol Use: Yes      Comment: Occ   OB History   Grav Para Term Preterm Abortions TAB SAB Ect Mult Living                 Review of Systems  Eyes: Positive for pain and visual disturbance.  Gastrointestinal: Positive for nausea and diarrhea.  Neurological: Positive for headaches.  All other systems reviewed and are negative.  Allergies  Amitriptyline; Baclofen; Cymbalta; Gabapentin; Monosodium glutamate; Other; Vicodin; Alprazolam; Magnesium salicylate; Rizatriptan; Tizanidine; Adhesive; and Lamotrigine  Home Medications   Prior to Admission medications   Medication Sig Start Date End Date Taking? Authorizing Provider  carbamazepine (CARBATROL) 200 MG 12 hr capsule Take 200 mg by mouth 3 (three) times daily. 04/22/13 04/22/14 Yes Historical Provider, MD  clonazePAM (KLONOPIN) 1 MG tablet Take 1 mg by mouth 2 (two) times daily as needed for anxiety.    Yes Historical Provider, MD  divalproex (DEPAKOTE ER) 500 MG 24 hr tablet Take 500 mg by mouth every morning. For depression   Yes Historical Provider, MD  EPINEPHrine (EPIPEN) 0.3 mg/0.3 mL SOAJ injection Inject 0.3 mg into the muscle once.   Yes Historical Provider, MD  Fingolimod HCl (GILENYA) 0.5 MG CAPS Take 1 capsule by  mouth every morning.    Yes Historical Provider, MD  levonorgestrel (MIRENA) 20 MCG/24HR IUD 1 each by Intrauterine route once.   Yes Historical Provider, MD  loratadine (CLARITIN) 10 MG tablet Take 10 mg by mouth every morning.    Yes Historical Provider, MD  metroNIDAZOLE (FLAGYL) 500 MG tablet Take 500 mg by mouth 3 (three) times daily.   Yes Historical Provider, MD  morphine (MSIR) 15 MG tablet Take 15 mg by mouth 4 (four) times daily.   Yes Historical Provider, MD  Multiple Vitamins-Minerals (AIRBORNE PO) Take 1 tablet by mouth daily as needed (for allergies).    Yes Historical Provider, MD  omeprazole (PRILOSEC) 40 MG capsule Take 40 mg by mouth every morning.    Yes Historical Provider, MD  ondansetron (ZOFRAN) 8 MG tablet Take 1  tablet (8 mg total) by mouth every 4 (four) hours as needed. 08/14/13  Yes Nat Christen, MD  potassium chloride (K-DUR) 10 MEQ tablet Take 10 mEq by mouth 2 (two) times daily.   Yes Historical Provider, MD  pregabalin (LYRICA) 100 MG capsule Take 300 mg by mouth at bedtime.    Yes Historical Provider, MD  promethazine (PHENERGAN) 25 MG tablet Take 1 tablet (25 mg total) by mouth every 6 (six) hours as needed. 08/17/13  Yes Nat Christen, MD  propranolol (INDERAL) 10 MG tablet Take 10 mg by mouth 2 (two) times daily.   Yes Historical Provider, MD  ranitidine (ZANTAC) 300 MG capsule Take 300 mg by mouth every evening.     Yes Historical Provider, MD  SUMAtriptan (IMITREX) 6 MG/0.5ML SOLN injection Inject 6 mg into the skin every 2 (two) hours as needed for migraine or headache. May repeat in 2 hours if headache persists or recurs.   Yes Historical Provider, MD  topiramate (TOPAMAX) 100 MG tablet Take 100 mg by mouth 2 (two) times daily.    Yes Historical Provider, MD   Triage Vitals: BP 148/106  Pulse 85  Temp(Src) 99.3 F (37.4 C) (Oral)  Resp 20  Ht 5\' 5"  (1.651 m)  Wt 200 lb (90.719 kg)  BMI 33.28 kg/m2  SpO2 100%  LMP 08/08/2013 Physical Exam  Nursing note and vitals reviewed. Constitutional: She is oriented to person, place, and time. She appears well-developed and well-nourished. No distress.  HENT:  Head: Normocephalic and atraumatic.  Right Ear: External ear normal.  Left Ear: External ear normal.  Mouth/Throat: Oropharynx is clear and moist.  Eyes: Conjunctivae are normal. Right eye exhibits no discharge. Left eye exhibits no discharge. No scleral icterus.  Neck: Neck supple. No tracheal deviation present.  Cardiovascular: Normal rate, regular rhythm and intact distal pulses.   Pulmonary/Chest: Effort normal and breath sounds normal. No stridor. No respiratory distress. She has no wheezes. She has no rales.  Abdominal: Soft. Bowel sounds are normal. She exhibits no distension. There is  no tenderness. There is no rebound and no guarding.  Musculoskeletal: She exhibits no edema and no tenderness.  Neurological: She is alert and oriented to person, place, and time. She has normal strength. No cranial nerve deficit (No facial droop, extraocular movements intact, tongue midline ) or sensory deficit. She exhibits normal muscle tone. She displays no seizure activity. Coordination normal.  No pronator drift bilateral upper extrem, able to hold both legs off bed for 5 seconds, sensation intact in all extremities, no visual field cuts, no left or right sided neglect, normal finger-nose exam bilaterally, no nystagmus noted   Skin: Skin is warm  and dry. No rash noted.  Psychiatric: She has a normal mood and affect.    ED Course  Procedures (including critical care time) DIAGNOSTIC STUDIES: Oxygen Saturation is 100 on room air, normal by my interpretation.    COORDINATION OF CARE: 5:22 PM-Discussed treatment plan which includes pain medication and steroid medication and pt agreed to plan.   Medications  0.9 %  sodium chloride infusion ( Intravenous New Bag/Given 09/06/13 1751)  prochlorperazine (COMPAZINE) injection 10 mg (10 mg Intravenous Given 09/06/13 1752)  HYDROmorphone (DILAUDID) injection 1 mg (1 mg Intravenous Given 09/06/13 1753)  dexamethasone (DECADRON) injection 8 mg (8 mg Intravenous Given 09/06/13 1755)    1830  patient is feeling better. She feels well enough to go home. MDM   Final diagnoses:  Ophthalmoplegic migraine, not intractable   The patient's symptoms I believe are related to a migraine headache. She has been seen in the emergency department for intractable headaches.  Neurologic exam was normal in the emergency department. I do recommend followup with her neurologist. She was given a dose of Decadron more for prevention of recurrent migraine rather than treatment of an MS flair  I personally performed the services described in this documentation, which was  scribed in my presence.  The recorded information has been reviewed and is accurate.    Dorie Rank, MD 09/06/13 [phone]

## 2013-09-06 NOTE — Discharge Instructions (Signed)

## 2013-09-07 ENCOUNTER — Encounter (HOSPITAL_COMMUNITY): Payer: Self-pay | Admitting: Emergency Medicine

## 2013-09-07 ENCOUNTER — Emergency Department (HOSPITAL_COMMUNITY)
Admission: EM | Admit: 2013-09-07 | Discharge: 2013-09-07 | Disposition: A | Discharge status: Home / Self Care | Payer: Medicare Other | Attending: Emergency Medicine | Admitting: Emergency Medicine

## 2013-09-07 DIAGNOSIS — J45909 Unspecified asthma, uncomplicated: Secondary | ICD-10-CM | POA: Diagnosis not present

## 2013-09-07 DIAGNOSIS — Z8742 Personal history of other diseases of the female genital tract: Secondary | ICD-10-CM | POA: Insufficient documentation

## 2013-09-07 DIAGNOSIS — R51 Headache: Secondary | ICD-10-CM | POA: Insufficient documentation

## 2013-09-07 DIAGNOSIS — F17200 Nicotine dependence, unspecified, uncomplicated: Secondary | ICD-10-CM | POA: Insufficient documentation

## 2013-09-07 DIAGNOSIS — Z79899 Other long term (current) drug therapy: Secondary | ICD-10-CM | POA: Insufficient documentation

## 2013-09-07 DIAGNOSIS — F411 Generalized anxiety disorder: Secondary | ICD-10-CM | POA: Insufficient documentation

## 2013-09-07 DIAGNOSIS — G8929 Other chronic pain: Secondary | ICD-10-CM | POA: Insufficient documentation

## 2013-09-07 DIAGNOSIS — G43009 Migraine without aura, not intractable, without status migrainosus: Secondary | ICD-10-CM | POA: Diagnosis not present

## 2013-09-07 DIAGNOSIS — G35 Multiple sclerosis: Secondary | ICD-10-CM | POA: Insufficient documentation

## 2013-09-07 DIAGNOSIS — K219 Gastro-esophageal reflux disease without esophagitis: Secondary | ICD-10-CM | POA: Insufficient documentation

## 2013-09-07 MED ORDER — DEXAMETHASONE SODIUM PHOSPHATE 4 MG/ML IJ SOLN
10.0000 mg | Freq: Once | INTRAMUSCULAR | Status: AC
  Administered 2013-09-07: 10 mg via INTRAVENOUS
  Filled 2013-09-07: qty 3

## 2013-09-07 MED ORDER — SODIUM CHLORIDE 0.9 % IV SOLN
1000.0000 mL | Freq: Once | INTRAVENOUS | Status: AC
  Administered 2013-09-07: 1000 mL via INTRAVENOUS

## 2013-09-07 MED ORDER — METOCLOPRAMIDE HCL 5 MG/ML IJ SOLN
10.0000 mg | Freq: Once | INTRAMUSCULAR | Status: AC
  Administered 2013-09-07: 10 mg via INTRAVENOUS
  Filled 2013-09-07: qty 2

## 2013-09-07 MED ORDER — DIPHENHYDRAMINE HCL 50 MG/ML IJ SOLN
25.0000 mg | Freq: Once | INTRAMUSCULAR | Status: AC
  Administered 2013-09-07: 25 mg via INTRAVENOUS
  Filled 2013-09-07: qty 1

## 2013-09-07 MED ORDER — KETOROLAC TROMETHAMINE 30 MG/ML IJ SOLN
30.0000 mg | Freq: Once | INTRAMUSCULAR | Status: AC
  Administered 2013-09-07: 30 mg via INTRAVENOUS
  Filled 2013-09-07: qty 1

## 2013-09-07 MED ORDER — SODIUM CHLORIDE 0.9 % IV SOLN: 1000.0000 mL | INTRAVENOUS | Status: DC

## 2013-09-07 NOTE — ED Notes (Signed)
Headache, nausea for 8 days, seen here yesterday for same

## 2013-09-07 NOTE — Discharge Instructions (Signed)
Go home and rest. Drink plenty of fluids. Recheck as needed.  °

## 2013-09-07 NOTE — ED Provider Notes (Signed)
CSN: [id number]     Arrival date & time 09/07/13  1656 History  This chart was scribed for Janice Norrie, MD by Delphia Grates, ED Scribe. This patient was seen in room APA19/APA19 and the patient's care was started at 5:48 PM.    Chief Complaint  Patient presents with  . Headache    The history is provided by the patient. No language interpreter was used.    HPI Comments: Emily [name] is a 33 y.o. Female, with history migraines, multiple sclerosis, asthma, GERD, urinary urgency, anxiety, depression, and chronic back pain who presents to the Emergency Department complaining of right, frontal HA onset 8 days ago. Patient was seen here yesterday for the same. She describes her pain as dull and aching. She reports history of migraines and states this one feels worse than her normal ones. She states she feels as if " her eyes are going to come out". There is associated photophobia, mild phonophobia, nausea, dizziness, and lightheadedness. Patient states she has not been eating that much, and denies emesis. Patient reports neck pain and numbness/tingling of the extremities, but states this is baseline and related to MS. She is seen by Dr. Doy Hutching at [facility], and has been receiving botox treatments for 3 months. She states that since she has been receiving the tretments, she now experiences HAs approximately once a month, which has improved. She reports her next treatment is scheduled for September 18th, 2015. Patient is a current smoker (less than half a pack per day) and drinks "very rarely".  Patient is not working and states she is on disability due to [place]. 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   PCP Dr Wenda Overland Neurology Dr Lidia Collum   Past Medical History  Diagnosis Date  . Headache(784.0)     migraines  . Multiple sclerosis   . Asthma as a child  . GERD (gastroesophageal reflux  disease)   . Urinary urgency   . Anxiety   . Perforated bowel 2009  . Migraines   . Depression   . Chronic back pain   . Chronic pain   . Ovarian cyst    Past Surgical History  Procedure Laterality Date  . Extremity cyst excision  1994    right leg  . 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 Colostomy closure  04/2007  . Scar revision  01/21/2011    Procedure: SCAR REVISION;  Surgeon: Hermelinda Dellen;  Location: [place];  Service: Plastics;  Laterality: N/A;  exploration of scar of abdomen and repair of defect  . Hernia repair    . Abdominal surgery    . Appendectomy     History reviewed. No pertinent family history. History  Substance Use Topics  . Smoking status: Current Some Day Smoker    Types: Cigarettes  . Smokeless tobacco: Never Used     Comment: quit smoking 10 days ago  . Alcohol Use: Yes     Comment: Occ  on disability for MS  OB History   Grav Para Term Preterm Abortions TAB SAB Ect Mult Living                 Review of Systems  Eyes: Positive for photophobia.  Gastrointestinal: Positive for nausea. Negative for vomiting.  Neurological: Positive for dizziness, light-headedness and headaches.  All other systems reviewed and  are negative.     Allergies  Amitriptyline; Baclofen; Cymbalta; Gabapentin; Monosodium glutamate; Other; Vicodin; Alprazolam; Magnesium salicylate; Rizatriptan; Tizanidine; Adhesive; and Lamotrigine  Home Medications   Prior to Admission medications   Medication Sig Start Date End Date Taking? Authorizing Provider  carbamazepine (CARBATROL) 200 MG 12 hr capsule Take 200 mg by mouth 3 (three) times daily. 04/22/13 04/22/14 Yes Historical Provider, MD  clonazePAM (KLONOPIN) 1 MG tablet Take 1 mg by mouth 2 (two) times daily as needed for anxiety.    Yes Historical Provider, MD  divalproex (DEPAKOTE ER) 500 MG 24 hr tablet Take 500 mg by mouth every morning. For depression   Yes Historical Provider, MD   EPINEPHrine (EPIPEN) 0.3 mg/0.3 mL SOAJ injection Inject 0.3 mg into the muscle once.   Yes Historical Provider, MD  Fingolimod HCl (GILENYA) 0.5 MG CAPS Take 1 capsule by mouth every morning.    Yes Historical Provider, MD  levonorgestrel (MIRENA) 20 MCG/24HR IUD 1 each by Intrauterine route once.   Yes Historical Provider, MD  loratadine (CLARITIN) 10 MG tablet Take 10 mg by mouth every morning.    Yes Historical Provider, MD  morphine (MSIR) 15 MG tablet Take 15 mg by mouth 4 (four) times daily.   Yes Historical Provider, MD  Multiple Vitamins-Minerals (AIRBORNE PO) Take 1 tablet by mouth daily as needed (for allergies).    Yes Historical Provider, MD  omeprazole (PRILOSEC) 40 MG capsule Take 40 mg by mouth every morning.    Yes Historical Provider, MD  ondansetron (ZOFRAN) 8 MG tablet Take 1 tablet (8 mg total) by mouth every 4 (four) hours as needed. 08/14/13  Yes Nat Christen, MD  potassium chloride (K-DUR) 10 MEQ tablet Take 10 mEq by mouth 2 (two) times daily.   Yes Historical Provider, MD  pregabalin (LYRICA) 100 MG capsule Take 300 mg by mouth at bedtime.    Yes Historical Provider, MD  promethazine (PHENERGAN) 25 MG tablet Take 1 tablet (25 mg total) by mouth every 6 (six) hours as needed. 08/17/13  Yes Nat Christen, MD  propranolol (INDERAL) 10 MG tablet Take 10 mg by mouth 2 (two) times daily.   Yes Historical Provider, MD  ranitidine (ZANTAC) 300 MG capsule Take 300 mg by mouth every evening.     Yes Historical Provider, MD  SUMAtriptan (IMITREX) 6 MG/0.5ML SOLN injection Inject 6 mg into the skin every 2 (two) hours as needed for migraine or headache. May repeat in 2 hours if headache persists or recurs.   Yes Historical Provider, MD  topiramate (TOPAMAX) 100 MG tablet Take 100 mg by mouth 2 (two) times daily.    Yes Historical Provider, MD   Triage Vitals: BP 158/110  Pulse 95  Temp(Src) 98.9 F (37.2 C) (Oral)  Resp 18  Ht 5\' 5"  (1.651 m)  Wt 200 lb (90.719 kg)  BMI 33.28 kg/m2   SpO2 100%  LMP 08/08/2013  Vital signs normal except hypertension   Physical Exam  Nursing note and vitals reviewed. Constitutional: She is oriented to person, place, and time. She appears well-developed and well-nourished.  Non-toxic appearance. She does not appear ill. No distress.  Wearing sunglasses in a dark room  HENT:  Head: Normocephalic and atraumatic.  Right Ear: External ear normal.  Left Ear: External ear normal.  Nose: Nose normal. No mucosal edema or rhinorrhea.  Mouth/Throat: Oropharynx is clear and moist and mucous membranes are normal. No dental abscesses or uvula swelling.  Eyes: Conjunctivae and EOM are normal.  Pupils are equal, round, and reactive to light.  Neck: Normal range of motion and full passive range of motion without pain. Neck supple.  Cardiovascular: Normal rate, regular rhythm and normal heart sounds.  Exam reveals no gallop and no friction rub.   No murmur heard. Pulmonary/Chest: Effort normal and breath sounds normal. No respiratory distress. She has no wheezes. She has no rhonchi. She has no rales. She exhibits no tenderness and no crepitus.  Abdominal: Soft. Normal appearance and bowel sounds are normal. She exhibits no distension. There is no tenderness. There is no rebound and no guarding.  Musculoskeletal: Normal range of motion. She exhibits no edema and no tenderness.  Moves all extremities well.   Neurological: She is alert and oriented to person, place, and time. She has normal strength. No cranial nerve deficit.  Skin: Skin is warm, dry and intact. No rash noted. No erythema. No pallor.  Psychiatric: She has a normal mood and affect. Her speech is normal and behavior is normal. Her mood appears not anxious.    ED Course  Procedures (including critical care time)  Medications  0.9 %  sodium chloride infusion (0 mLs Intravenous Stopped 09/07/13 2035)    Followed by  0.9 %  sodium chloride infusion (0 mLs Intravenous Stopped 09/07/13 2036)     Followed by  0.9 %  sodium chloride infusion (0 mLs Intravenous Stopped 09/07/13 2036)  dexamethasone (DECADRON) injection 10 mg (10 mg Intravenous Given 09/07/13 1854)  ketorolac (TORADOL) 30 MG/ML injection 30 mg (30 mg Intravenous Given 09/07/13 1857)  metoCLOPramide (REGLAN) injection 10 mg (10 mg Intravenous Given 09/07/13 1857)  diphenhydrAMINE (BENADRYL) injection 25 mg (25 mg Intravenous Given 09/07/13 1856)     DIAGNOSTIC STUDIES: Oxygen Saturation is 100% on room air, normal by my interpretation.    COORDINATION OF CARE: At 1815 Discussed treatment plan with patient which includes migraine cocktail. Patient agrees.   Patient has had 18 ED visits in the past 6 months. She was seen yesterday for similar headache and received hydromorphone.  8:13 PM- Upon recheck, patient is no longer wearing sunglasses and states her HA has improved. Feels ready to be discharged.    Labs Review Labs Reviewed - No data to display  Imaging Review No results found.   EKG Interpretation None      MDM   Final diagnoses:  Migraine without aura and responsive to treatment   Plan discharge  Rolland Porter, MD, FACEP   I personally performed the services described in this documentation, which was scribed in my presence. The recorded information has been reviewed and considered.  Rolland Porter, MD, Abram Sander    Janice Norrie, MD 09/07/13 2046

## 2013-09-09 ENCOUNTER — Emergency Department (HOSPITAL_COMMUNITY)
Admission: EM | Admit: 2013-09-09 | Discharge: 2013-09-09 | Discharge status: Against Medical Advice | Payer: Medicare Other | Source: Home / Self Care

## 2013-09-09 ENCOUNTER — Emergency Department (HOSPITAL_BASED_OUTPATIENT_CLINIC_OR_DEPARTMENT_OTHER)
Admission: EM | Admit: 2013-09-09 | Discharge: 2013-09-09 | Disposition: A | Discharge status: Home / Self Care | Payer: Medicare Other | Attending: Emergency Medicine | Admitting: Emergency Medicine

## 2013-09-09 ENCOUNTER — Encounter (HOSPITAL_COMMUNITY): Payer: Self-pay | Admitting: Emergency Medicine

## 2013-09-09 ENCOUNTER — Encounter (HOSPITAL_BASED_OUTPATIENT_CLINIC_OR_DEPARTMENT_OTHER): Payer: Self-pay | Admitting: Emergency Medicine

## 2013-09-09 DIAGNOSIS — F411 Generalized anxiety disorder: Secondary | ICD-10-CM | POA: Diagnosis not present

## 2013-09-09 DIAGNOSIS — G43909 Migraine, unspecified, not intractable, without status migrainosus: Secondary | ICD-10-CM | POA: Diagnosis present

## 2013-09-09 DIAGNOSIS — G8929 Other chronic pain: Secondary | ICD-10-CM | POA: Insufficient documentation

## 2013-09-09 DIAGNOSIS — F329 Major depressive disorder, single episode, unspecified: Secondary | ICD-10-CM | POA: Insufficient documentation

## 2013-09-09 DIAGNOSIS — G35 Multiple sclerosis: Secondary | ICD-10-CM | POA: Diagnosis not present

## 2013-09-09 DIAGNOSIS — R51 Headache: Secondary | ICD-10-CM

## 2013-09-09 DIAGNOSIS — K219 Gastro-esophageal reflux disease without esophagitis: Secondary | ICD-10-CM | POA: Insufficient documentation

## 2013-09-09 DIAGNOSIS — Z8742 Personal history of other diseases of the female genital tract: Secondary | ICD-10-CM | POA: Insufficient documentation

## 2013-09-09 DIAGNOSIS — F17200 Nicotine dependence, unspecified, uncomplicated: Secondary | ICD-10-CM | POA: Insufficient documentation

## 2013-09-09 DIAGNOSIS — Z79899 Other long term (current) drug therapy: Secondary | ICD-10-CM | POA: Insufficient documentation

## 2013-09-09 DIAGNOSIS — R55 Syncope and collapse: Secondary | ICD-10-CM

## 2013-09-09 DIAGNOSIS — F3289 Other specified depressive episodes: Secondary | ICD-10-CM | POA: Insufficient documentation

## 2013-09-09 DIAGNOSIS — J45909 Unspecified asthma, uncomplicated: Secondary | ICD-10-CM | POA: Diagnosis not present

## 2013-09-09 DIAGNOSIS — G43101 Migraine with aura, not intractable, with status migrainosus: Secondary | ICD-10-CM | POA: Diagnosis not present

## 2013-09-09 MED ORDER — SODIUM CHLORIDE 0.9 % IV SOLN: 1000.0000 mL | INTRAVENOUS | Status: DC

## 2013-09-09 MED ORDER — HYDROMORPHONE HCL PF 1 MG/ML IJ SOLN
1.0000 mg | Freq: Once | INTRAMUSCULAR | Status: AC
  Administered 2013-09-09: 1 mg via INTRAVENOUS
  Filled 2013-09-09: qty 1

## 2013-09-09 MED ORDER — DIPHENHYDRAMINE HCL 50 MG/ML IJ SOLN
25.0000 mg | Freq: Once | INTRAMUSCULAR | Status: AC
  Administered 2013-09-09: 25 mg via INTRAVENOUS
  Filled 2013-09-09: qty 1

## 2013-09-09 MED ORDER — METOCLOPRAMIDE HCL 10 MG PO TABS: 10.0000 mg | ORAL_TABLET | Freq: Four times a day (QID) | ORAL | Status: DC | PRN

## 2013-09-09 MED ORDER — DEXAMETHASONE SODIUM PHOSPHATE 20 MG/5ML IJ SOLN
INTRAMUSCULAR | Status: AC
  Administered 2013-09-09: 10 mg
  Filled 2013-09-09: qty 5

## 2013-09-09 MED ORDER — DEXAMETHASONE SODIUM PHOSPHATE 10 MG/ML IJ SOLN: 10.0000 mg | Freq: Once | INTRAMUSCULAR | Status: AC

## 2013-09-09 MED ORDER — SODIUM CHLORIDE 0.9 % IV SOLN
1000.0000 mL | Freq: Once | INTRAVENOUS | Status: AC
  Administered 2013-09-09: 1000 mL via INTRAVENOUS

## 2013-09-09 MED ORDER — METOCLOPRAMIDE HCL 5 MG/ML IJ SOLN
10.0000 mg | Freq: Once | INTRAMUSCULAR | Status: AC
  Administered 2013-09-09: 10 mg via INTRAVENOUS
  Filled 2013-09-09: qty 2

## 2013-09-09 MED ORDER — PROCHLORPERAZINE EDISYLATE 5 MG/ML IJ SOLN
10.0000 mg | Freq: Once | INTRAMUSCULAR | Status: DC
  Filled 2013-09-09: qty 2

## 2013-09-09 NOTE — ED Notes (Signed)
Called pt to reassess vital  Signs. Pt not in waiting room.

## 2013-09-09 NOTE — ED Notes (Signed)
PT states she has had a migraine x8 days and was seen at the neurologist yesterday and went to ER and had a syncopal episode in the bathroom there. PT states her headache has became worse with light/sound sensitivity.

## 2013-09-09 NOTE — ED Provider Notes (Signed)
CSN: [id number]     Arrival date & time 09/09/13  1905 History   First MD Initiated Contact with Patient 09/09/13 2032     This chart was scribed for Delora Fuel, MD by Forrestine Him, ED Scribe. This patient was seen in room MH09/MH09 and the patient's care was started 8:39 PM.   Chief Complaint  Patient presents with  . Migraine   The history is provided by the patient. No language interpreter was used.    HPI Comments: Avah L Hornbaker is a 33 y.o. female with a PMHx of multiple sclerosis, GERD, anxiety, migraines, and chronic back pain who presents to the Emergency Department complaining of a constant, moderate, ongoing R frontal and parietal migraine x 2 weeks. Pt states this episode has gradually worsened since time of onset. Currently rates pain 10/10 and describes HA as sharp and achy. She also reports associated photophobia, nausea, dizziness, mild phonophobia, and lightheadedness. HA is exacerbated when she puts her head down and with certain motions of her head. No alleviating factors at this time. She denies any fever or chills. No visual disturbances or blurred vision secondary to migraine. Pt has been in ED several times with same complaint. Most recently she was seen at [facility] earlier this afternoon but did not stay due to 5 hour wait. States "they told me people are here much worse and I could probably manage my symptoms". With previous ED migraine encounters, she has found temporary relief with migraine cocktail, phenergan, reglan, decadron, and dilaudid. However, she states Toradol is now ineffective for her due to tolerance. Ms. Abreu is due to follow up with her Neurologist tomorrow. Pt with multiple known allergies to medications listed below. No other concerns this visit.  She is followed by Dr. Barnett Hatter- Neurology   Past Medical History  Diagnosis Date  . Headache(784.0)     migraines  . Multiple sclerosis   . Asthma as a child  . GERD (gastroesophageal reflux  disease)   . Urinary urgency   . Anxiety   . Perforated bowel 2009  . Migraines   . Depression   . Chronic back pain   . Chronic pain   . Ovarian cyst    Past Surgical History  Procedure Laterality Date  . Extremity cyst excision  1994    right leg  . 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 Colostomy closure  04/2007  . Scar revision  01/21/2011    Procedure: SCAR REVISION;  Surgeon: Hermelinda Dellen;  Location: [place];  Service: Plastics;  Laterality: N/A;  exploration of scar of abdomen and repair of defect  . Hernia repair    . Abdominal surgery    . Appendectomy     History reviewed. No pertinent family history. History  Substance Use Topics  . Smoking status: Current Some Day Smoker    Types: Cigarettes  . Smokeless tobacco: Never Used     Comment: quit smoking 10 days ago  . Alcohol Use: Yes     Comment: Occ   OB History   Grav Para Term Preterm Abortions TAB SAB Ect Mult Living                 Review of Systems  Constitutional: Negative for fever and chills.  Eyes: Positive for photophobia. Negative for visual disturbance.  Gastrointestinal: Positive for nausea.  Neurological: Positive for dizziness, light-headedness and headaches.  All other systems reviewed and are negative.  Allergies  Amitriptyline; Baclofen; Cymbalta; Gabapentin; Monosodium glutamate; Other; Vicodin; Alprazolam; Magnesium salicylate; Rizatriptan; Tizanidine; Adhesive; and Lamotrigine  Home Medications   Prior to Admission medications   Medication Sig Start Date End Date Taking? Authorizing Provider  carbamazepine (CARBATROL) 200 MG 12 hr capsule Take 200 mg by mouth 3 (three) times daily. 04/22/13 04/22/14  Historical Provider, MD  clonazePAM (KLONOPIN) 1 MG tablet Take 1 mg by mouth 2 (two) times daily as needed for anxiety.     Historical Provider, MD  divalproex (DEPAKOTE ER) 500 MG 24 hr tablet Take 500 mg by mouth every morning. For depression     Historical Provider, MD  EPINEPHrine (EPIPEN) 0.3 mg/0.3 mL SOAJ injection Inject 0.3 mg into the muscle once.    Historical Provider, MD  Fingolimod HCl (GILENYA) 0.5 MG CAPS Take 1 capsule by mouth every morning.     Historical Provider, MD  levonorgestrel (MIRENA) 20 MCG/24HR IUD 1 each by Intrauterine route once.    Historical Provider, MD  loratadine (CLARITIN) 10 MG tablet Take 10 mg by mouth every morning.     Historical Provider, MD  morphine (MSIR) 15 MG tablet Take 15 mg by mouth 4 (four) times daily.    Historical Provider, MD  Multiple Vitamins-Minerals (AIRBORNE PO) Take 1 tablet by mouth daily as needed (for allergies).     Historical Provider, MD  omeprazole (PRILOSEC) 40 MG capsule Take 40 mg by mouth every morning.     Historical Provider, MD  ondansetron (ZOFRAN) 8 MG tablet Take 1 tablet (8 mg total) by mouth every 4 (four) hours as needed. 08/14/13   Nat Christen, MD  potassium chloride (K-DUR) 10 MEQ tablet Take 10 mEq by mouth 2 (two) times daily.    Historical Provider, MD  pregabalin (LYRICA) 100 MG capsule Take 300 mg by mouth at bedtime.     Historical Provider, MD  promethazine (PHENERGAN) 25 MG tablet Take 1 tablet (25 mg total) by mouth every 6 (six) hours as needed. 08/17/13   Nat Christen, MD  propranolol (INDERAL) 10 MG tablet Take 10 mg by mouth 2 (two) times daily.    Historical Provider, MD  ranitidine (ZANTAC) 300 MG capsule Take 300 mg by mouth every evening.      Historical Provider, MD  SUMAtriptan (IMITREX) 6 MG/0.5ML SOLN injection Inject 6 mg into the skin every 2 (two) hours as needed for migraine or headache. May repeat in 2 hours if headache persists or recurs.    Historical Provider, MD  topiramate (TOPAMAX) 100 MG tablet Take 100 mg by mouth 2 (two) times daily.     Historical Provider, MD   Triage Vitals: BP 139/100  Pulse 94  Temp(Src) 98 F (36.7 C) (Oral)  Resp 22  Ht 5\' 5"  (1.651 m)  Wt 200 lb (90.719 kg)  BMI 33.28 kg/m2  SpO2 100%  LMP  08/08/2013   Physical Exam  Nursing note and vitals reviewed. Constitutional: She is oriented to person, place, and time. She appears well-developed and well-nourished. No distress.  HENT:  Head: Normocephalic and atraumatic.  Eyes: EOM are normal. Pupils are equal, round, and reactive to light.  Fundi normal  Neck: Normal range of motion. Neck supple. No JVD present.  Moderate tenderness of R paracervical muslces  Cardiovascular: Normal rate, regular rhythm and normal heart sounds.   No murmur heard. Pulmonary/Chest: Effort normal and breath sounds normal. She has no wheezes. She has no rales.  Abdominal: Soft. She exhibits no distension and no  mass. There is no tenderness.  Musculoskeletal: Normal range of motion. She exhibits no edema.  Lymphadenopathy:    She has no cervical adenopathy.  Neurological: She is alert and oriented to person, place, and time. She has normal reflexes. No cranial nerve deficit. Coordination normal.  Skin: Skin is warm and dry. No rash noted.  Psychiatric: She has a normal mood and affect. Her behavior is normal. Judgment and thought content normal.    ED Course  Procedures (including critical care time)  DIAGNOSTIC STUDIES: Oxygen Saturation is 100% on RA, Normal by my interpretation.    COORDINATION OF CARE: 8:42 PM- Will give reglan, fluids, benadryl, dilaudid, and decadron. Discussed treatment plan with pt at bedside and pt agreed to plan.     MDM   Final diagnoses:  Migraine with aura and with status migrainosus, not intractable    Headache consistent with migraine headache. Old records are reviewed and she has 3 ED visits for migraines in the last week which were all treated with migraine cocktails. On one occasion, she also received hydromorphone. She has received dexamethasone on 2 occasions. She is given a headache cocktail consisting of IV fluids, IV metoclopramide, IV diphenhydramine, IV dexamethasone, and also was given a single dose of  IV hydromorphone. She had excellent relief of her headache with this. She is discharged with a prescription for metoclopramide.  I personally performed the services described in this documentation, which was scribed in my presence. The recorded information has been reviewed and is accurate.    Delora Fuel, MD 12/25/73 [id number]

## 2013-09-09 NOTE — Discharge Instructions (Signed)
Migraine Headache A migraine headache is an intense, throbbing pain on one or both sides of your head. A migraine can last for 30 minutes to several hours. CAUSES  The exact cause of a migraine headache is not always known. However, a migraine may be caused when nerves in the brain become irritated and release chemicals that cause inflammation. This causes pain. Certain things may also trigger migraines, such as:  Alcohol.  Smoking.  Stress.  Menstruation.  Aged cheeses.  Foods or drinks that contain nitrates, glutamate, aspartame, or tyramine.  Lack of sleep.  Chocolate.  Caffeine.  Hunger.  Physical exertion.  Fatigue.  Medicines used to treat chest pain (nitroglycerine), birth control pills, estrogen, and some blood pressure medicines. SIGNS AND SYMPTOMS  Pain on one or both sides of your head.  Pulsating or throbbing pain.  Severe pain that prevents daily activities.  Pain that is aggravated by any physical activity.  Nausea, vomiting, or both.  Dizziness.  Pain with exposure to bright lights, loud noises, or activity.  General sensitivity to bright lights, loud noises, or smells. Before you get a migraine, you may get warning signs that a migraine is coming (aura). An aura may include:  Seeing flashing lights.  Seeing bright spots, halos, or zigzag lines.  Having tunnel vision or blurred vision.  Having feelings of numbness or tingling.  Having trouble talking.  Having muscle weakness. DIAGNOSIS  A migraine headache is often diagnosed based on:  Symptoms.  Physical exam.  A CT scan or MRI of your head. These imaging tests cannot diagnose migraines, but they can help rule out other causes of headaches. TREATMENT Medicines may be given for pain and nausea. Medicines can also be given to help prevent recurrent migraines.  HOME CARE INSTRUCTIONS  Only take over-the-counter or prescription medicines for pain or discomfort as directed by your  health care provider. The use of long-term narcotics is not recommended.  Lie down in a dark, quiet room when you have a migraine.  Keep a journal to find out what may trigger your migraine headaches. For example, write down:  What you eat and drink.  How much sleep you get.  Any change to your diet or medicines.  Limit alcohol consumption.  Quit smoking if you smoke.  Get 7-9 hours of sleep, or as recommended by your health care provider.  Limit stress.  Keep lights dim if bright lights bother you and make your migraines worse. SEEK IMMEDIATE MEDICAL CARE IF:   Your migraine becomes severe.  You have a fever.  You have a stiff neck.  You have vision loss.  You have muscular weakness or loss of muscle control.  You start losing your balance or have trouble walking.  You feel faint or pass out.  You have severe symptoms that are different from your first symptoms. MAKE SURE YOU:   Understand these instructions.  Will watch your condition.  Will get help right away if you are not doing well or get worse. Document Released: 12/24/2004 Document Revised: 05/10/2013 Document Reviewed: 08/31/2012 [facility] Patient Information 2015 [place], [place]. This information is not intended to replace advice given to you by your health care provider. Make sure you discuss any questions you have with your health care provider.  Metoclopramide tablets What is this medicine? METOCLOPRAMIDE (met oh kloe PRA mide) is used to treat the symptoms of gastroesophageal reflux disease (GERD) like heartburn. It is also used to treat people with slow emptying of the stomach and  intestinal tract. This medicine may be used for other purposes; ask your health care provider or pharmacist if you have questions. COMMON BRAND NAME(S): Reglan What should I tell my health care provider before I take this medicine? They need to know if you have any of these conditions: -breast  cancer -depression -diabetes -heart failure -high blood pressure -kidney disease -liver disease -Parkinson's disease or a movement disorder -pheochromocytoma -seizures -stomach obstruction, bleeding, or perforation -an unusual or allergic reaction to metoclopramide, procainamide, sulfites, other medicines, foods, dyes, or preservatives -pregnant or trying to get pregnant -breast-feeding How should I use this medicine? Take this medicine by mouth with a glass of water. Follow the directions on the prescription label. Take this medicine on an empty stomach, about 30 minutes before eating. Take your doses at regular intervals. Do not take your medicine more often than directed. Do not stop taking except on the advice of your doctor or health care professional. A special MedGuide will be given to you by the pharmacist with each prescription and refill. Be sure to read this information carefully each time. Talk to your pediatrician regarding the use of this medicine in children. Special care may be needed. Overdosage: If you think you have taken too much of this medicine contact a poison control center or emergency room at once. NOTE: This medicine is only for you. Do not share this medicine with others. What if I miss a dose? If you miss a dose, take it as soon as you can. If it is almost time for your next dose, take only that dose. Do not take double or extra doses. What may interact with this medicine? -acetaminophen -cyclosporine -digoxin -medicines for blood pressure -medicines for diabetes, including insulin -medicines for hay fever and other allergies -medicines for depression, especially an Monoamine Oxidase Inhibitor (MAOI) -medicines for Parkinson's disease, like levodopa -medicines for sleep or for pain -tetracycline This list may not describe all possible interactions. Give your health care provider a list of all the medicines, herbs, non-prescription drugs, or dietary  supplements you use. Also tell them if you smoke, drink alcohol, or use illegal drugs. Some items may interact with your medicine. What should I watch for while using this medicine? It may take a few weeks for your stomach condition to start to get better. However, do not take this medicine for longer than 12 weeks. The longer you take this medicine, and the more you take it, the greater your chances are of developing serious side effects. If you are an elderly patient, a female patient, or you have diabetes, you may be at an increased risk for side effects from this medicine. Contact your doctor immediately if you start having movements you cannot control such as lip smacking, rapid movements of the tongue, involuntary or uncontrollable movements of the eyes, head, arms and legs, or muscle twitches and spasms. Patients and their families should watch out for worsening depression or thoughts of suicide. Also watch out for any sudden or severe changes in feelings such as feeling anxious, agitated, panicky, irritable, hostile, aggressive, impulsive, severely restless, overly excited and hyperactive, or not being able to sleep. If this happens, especially at the beginning of treatment or after a change in dose, call your doctor. Do not treat yourself for high fever. Ask your doctor or health care professional for advice. You may get drowsy or dizzy. Do not drive, use machinery, or do anything that needs mental alertness until you know how this drug affects you.  Do not stand or sit up quickly, especially if you are an older patient. This reduces the risk of dizzy or fainting spells. Alcohol can make you more drowsy and dizzy. Avoid alcoholic drinks. What side effects may I notice from receiving this medicine? Side effects that you should report to your doctor or health care professional as soon as possible: -allergic reactions like skin rash, itching or hives, swelling of the face, lips, or tongue -abnormal  production of milk in females -breast enlargement in both males and females -change in the way you walk -difficulty moving, speaking or swallowing -drooling, lip smacking, or rapid movements of the tongue -excessive sweating -fever -involuntary or uncontrollable movements of the eyes, head, arms and legs -irregular heartbeat or palpitations -muscle twitches and spasms -unusually weak or tired Side effects that usually do not require medical attention (report to your doctor or health care professional if they continue or are bothersome): -change in sex drive or performance -depressed mood -diarrhea -difficulty sleeping -headache -menstrual changes -restless or nervous This list may not describe all possible side effects. Call your doctor for medical advice about side effects. You may report side effects to FDA at 1-800-FDA-1088. Where should I keep my medicine? Keep out of the reach of children. Store at room temperature between 20 and 25 degrees C (68 and 77 degrees F). Protect from light. Keep container tightly closed. Throw away any unused medicine after the expiration date. NOTE: This sheet is a summary. It may not cover all possible information. If you have questions about this medicine, talk to your doctor, pharmacist, or health care provider.  2015, [employer]. (2011-04-23 13:04:38)

## 2013-09-09 NOTE — ED Notes (Signed)
MD at bedside. 

## 2013-09-09 NOTE — ED Notes (Signed)
Pt not in waiting area x 2 

## 2013-09-09 NOTE — ED Notes (Signed)
Patient states that she has had a HA x 2 weeks, has been to multiple hospitals and DR's for treatment. The patient also reports that she has a Garment/textile technologist appointment tomorrow.

## 2013-09-10 ENCOUNTER — Encounter (HOSPITAL_COMMUNITY): Payer: Self-pay | Admitting: Emergency Medicine

## 2013-09-10 ENCOUNTER — Emergency Department (HOSPITAL_COMMUNITY)
Admission: EM | Admit: 2013-09-10 | Discharge: 2013-09-10 | Discharge status: Against Medical Advice | Payer: Medicare Other | Attending: Emergency Medicine | Admitting: Emergency Medicine

## 2013-09-10 DIAGNOSIS — R51 Headache: Secondary | ICD-10-CM

## 2013-09-10 DIAGNOSIS — G43909 Migraine, unspecified, not intractable, without status migrainosus: Secondary | ICD-10-CM | POA: Diagnosis present

## 2013-09-10 DIAGNOSIS — R519 Headache, unspecified: Secondary | ICD-10-CM

## 2013-09-10 DIAGNOSIS — F3289 Other specified depressive episodes: Secondary | ICD-10-CM | POA: Insufficient documentation

## 2013-09-10 DIAGNOSIS — J45909 Unspecified asthma, uncomplicated: Secondary | ICD-10-CM | POA: Insufficient documentation

## 2013-09-10 DIAGNOSIS — G35 Multiple sclerosis: Secondary | ICD-10-CM | POA: Insufficient documentation

## 2013-09-10 DIAGNOSIS — F17200 Nicotine dependence, unspecified, uncomplicated: Secondary | ICD-10-CM | POA: Insufficient documentation

## 2013-09-10 DIAGNOSIS — F329 Major depressive disorder, single episode, unspecified: Secondary | ICD-10-CM | POA: Insufficient documentation

## 2013-09-10 DIAGNOSIS — Z79899 Other long term (current) drug therapy: Secondary | ICD-10-CM | POA: Diagnosis not present

## 2013-09-10 DIAGNOSIS — K219 Gastro-esophageal reflux disease without esophagitis: Secondary | ICD-10-CM | POA: Diagnosis not present

## 2013-09-10 DIAGNOSIS — F411 Generalized anxiety disorder: Secondary | ICD-10-CM | POA: Diagnosis not present

## 2013-09-10 DIAGNOSIS — G8929 Other chronic pain: Secondary | ICD-10-CM | POA: Diagnosis not present

## 2013-09-10 MED ORDER — DIPHENHYDRAMINE HCL 50 MG/ML IJ SOLN
25.0000 mg | Freq: Once | INTRAMUSCULAR | Status: AC
  Administered 2013-09-10: 25 mg via INTRAVENOUS
  Filled 2013-09-10: qty 1

## 2013-09-10 MED ORDER — METOCLOPRAMIDE HCL 5 MG/ML IJ SOLN
10.0000 mg | Freq: Once | INTRAMUSCULAR | Status: AC
  Administered 2013-09-10: 10 mg via INTRAVENOUS
  Filled 2013-09-10: qty 2

## 2013-09-10 MED ORDER — SODIUM CHLORIDE 0.9 % IV BOLUS (SEPSIS)
1000.0000 mL | Freq: Once | INTRAVENOUS | Status: AC
  Administered 2013-09-10: 1000 mL via INTRAVENOUS

## 2013-09-10 NOTE — ED Notes (Signed)
Pt wanting IV removed and to leave, states meds are not helping and EDP is not listening concerning meds that do help, pt requesting Dilaudid IV, IV removed and pt signed AMA form, pt and boyfriend left, pt with steady gait at time of leaving

## 2013-09-10 NOTE — ED Provider Notes (Signed)
CSN: [id number]     Arrival date & time 09/10/13  1508 History  This chart was scribed for Carmin Muskrat, MD by Marlowe Kays, ED Scribe. This patient was seen in room APA12/APA12 and the patient's care was started at 3:46 PM.  Chief Complaint  Patient presents with  . Migraine   The history is provided by the patient. No language interpreter was used.   HPI Comments:  Emily [name] is a 33 y.o. female with h/o chronic pain and migraines who presents to the Emergency Department complaining of ongoing migraine for the past week. She states she saw her neurologist earlier today and received an occipital nerve block. She states she has Morphine 15 mg but was instructed to take that sparingly secondary to rebound HA. Neurologist advised pt to report to ED for continued pain. Pt reports taking all her other medications as prescribed. She denies weakness.  Past Medical History  Diagnosis Date  . Headache(784.0)     migraines  . Multiple sclerosis   . Asthma as a child  . GERD (gastroesophageal reflux disease)   . Urinary urgency   . Anxiety   . Perforated bowel 2009  . Migraines   . Depression   . Chronic back pain   . Chronic pain   . Ovarian cyst    Past Surgical History  Procedure Laterality Date  . Extremity cyst excision  1994    right leg  . 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 Colostomy closure  04/2007  . Scar revision  01/21/2011    Procedure: SCAR REVISION;  Surgeon: Hermelinda Dellen;  Location: [facility];  Service: Plastics;  Laterality: N/A;  exploration of scar of abdomen and repair of defect  . Hernia repair    . Abdominal surgery    . Appendectomy     History reviewed. No pertinent family history. History  Substance Use Topics  . Smoking status: Current Some Day Smoker    Types: Cigarettes  . Smokeless tobacco: Never Used     Comment: quit smoking 10 days ago  . Alcohol Use: Yes     Comment: Occ   OB History   Grav Para Term  Preterm Abortions TAB SAB Ect Mult Living                 Review of Systems  Constitutional:       Per HPI, otherwise negative  HENT:       Per HPI, otherwise negative  Respiratory:       Per HPI, otherwise negative  Cardiovascular:       Per HPI, otherwise negative  Gastrointestinal: Negative for vomiting.  Endocrine:       Negative aside from HPI  Genitourinary:       Neg aside from HPI   Musculoskeletal:       Per HPI, otherwise negative  Skin: Negative.   Neurological: Positive for headaches. Negative for syncope and weakness.    Allergies  Amitriptyline; Baclofen; Cymbalta; Gabapentin; Monosodium glutamate; Other; Vicodin; Alprazolam; Magnesium salicylate; Rizatriptan; Tizanidine; Adhesive; and Lamotrigine  Home Medications   Prior to Admission medications   Medication Sig Start Date End Date Taking? Authorizing Provider  carbamazepine (CARBATROL) 200 MG 12 hr capsule Take 200 mg by mouth 3 (three) times daily. 04/22/13 04/22/14 Yes Historical Provider, MD  clonazePAM (KLONOPIN) 1 MG tablet Take 1 mg by mouth 2 (two) times daily as needed for anxiety.    Yes  Historical Provider, MD  divalproex (DEPAKOTE ER) 500 MG 24 hr tablet Take 500 mg by mouth every morning. For depression   Yes Historical Provider, MD  Fingolimod HCl (GILENYA) 0.5 MG CAPS Take 1 capsule by mouth every morning.    Yes Historical Provider, MD  levonorgestrel (MIRENA) 20 MCG/24HR IUD 1 each by Intrauterine route once.   Yes Historical Provider, MD  loratadine (CLARITIN) 10 MG tablet Take 10 mg by mouth every morning.    Yes Historical Provider, MD  morphine (MSIR) 15 MG tablet Take 15 mg by mouth 4 (four) times daily.   Yes Historical Provider, MD  omeprazole (PRILOSEC) 40 MG capsule Take 40 mg by mouth every morning.    Yes Historical Provider, MD  ondansetron (ZOFRAN) 8 MG tablet Take 1 tablet (8 mg total) by mouth every 4 (four) hours as needed. 08/14/13  Yes Nat Christen, MD  potassium chloride (K-DUR) 10  MEQ tablet Take 10 mEq by mouth 2 (two) times daily.   Yes Historical Provider, MD  pregabalin (LYRICA) 100 MG capsule Take 300 mg by mouth at bedtime.    Yes Historical Provider, MD  promethazine (PHENERGAN) 25 MG tablet Take 1 tablet (25 mg total) by mouth every 6 (six) hours as needed. 08/17/13  Yes Nat Christen, MD  propranolol (INDERAL) 10 MG tablet Take 10 mg by mouth 2 (two) times daily.   Yes Historical Provider, MD  ranitidine (ZANTAC) 300 MG capsule Take 300 mg by mouth every evening.     Yes Historical Provider, MD  topiramate (TOPAMAX) 100 MG tablet Take 100 mg by mouth 2 (two) times daily.    Yes Historical Provider, MD  EPINEPHrine (EPIPEN) 0.3 mg/0.3 mL SOAJ injection Inject 0.3 mg into the muscle once.    Historical Provider, MD  metoCLOPramide (REGLAN) 10 MG tablet Take 1 tablet (10 mg total) by mouth every 6 (six) hours as needed for nausea (or headache). 08/11/11   Delora Fuel, MD   Triage Vitals: BP 144/100  Pulse 111  Temp(Src) 99.8 F (37.7 C) (Oral)  Resp 18  Ht 5\' 5"  (1.651 m)  Wt 200 lb (90.719 kg)  BMI 33.28 kg/m2  SpO2 100%  LMP 08/08/2013 Physical Exam  Nursing note and vitals reviewed. Constitutional: She is oriented to person, place, and time. She appears well-developed and well-nourished. No distress.  HENT:  Head: Normocephalic and atraumatic.  Eyes: Conjunctivae and EOM are normal.  Cardiovascular: Normal rate, regular rhythm and normal heart sounds.  Exam reveals no gallop and no friction rub.   No murmur heard. Pulmonary/Chest: Effort normal and breath sounds normal. No stridor. No respiratory distress. She has no wheezes. She has no rales.  Abdominal: She exhibits no distension.  Musculoskeletal: She exhibits no edema.  Neurological: She is alert and oriented to person, place, and time. No cranial nerve deficit.  Skin: Skin is warm and dry.  Psychiatric: She has a normal mood and affect.    ED Course  Procedures (including critical care  time) DIAGNOSTIC STUDIES: Oxygen Saturation is 100% on RA, normal by my interpretation.   COORDINATION OF CARE: 3:50 PM- Will treat with Benadryl, Reglan and IV fluids. Pt verbalizes understanding and agrees to plan.  Medications - No data to display  On repeat exam the patient appears calm, and in no distress.  On exam the patient states that she continues to have mild head pain, no new complaints.  After the initial evaluation I have discussed the patient's case with her neurology  team. The covering physician for her primary neurologist reviewed the patient's chart from the office today. There is no mention of the patient being referred to the emergency department for additional pain management, narcotics. Patient is noted to be on chronic morphine therapy at home, and it is unclear to the other position while the patient is currently asking for narcotics for pain relief, while voicing unwillingness to take narcotics at home.  On repeat exam the patient is in similar condition, in no distress.  Subsequently, the patient and from the nurse, that she was going to leave [facility]. Patient had not received the completion of her therapy, with additional fluids, repeat evaluation pending. However, the patient was ambulatory, clearly speaking, and in no distress, and the patient left prior to my repeat evaluation.    MDM   Please see the above course. Essentially this is a patient with chronic headaches presenting on the same day of seeing her neurologist for additional pain management. Patient gave a contradictory stories about the use of narcotics for her headache relief. After the patient did not receive additional narcotics here, and prior to my repeat evaluation, or completing her initial therapy, the patient left [facility].   I personally performed the services described in this documentation, which was scribed in my presence. The recorded information has  been reviewed and is accurate.      Carmin Muskrat, MD 09/10/13 [phone]

## 2013-09-10 NOTE — ED Notes (Signed)
Patient states that she has had a migraine x 9days. Patient seen at [facility] hospital and received medication that helped for 19 hours but pain has come back without any relief.

## 2013-09-15 ENCOUNTER — Emergency Department (HOSPITAL_COMMUNITY)
Admission: EM | Admit: 2013-09-15 | Discharge: 2013-09-15 | Discharge status: Against Medical Advice | Payer: Medicare Other | Attending: Emergency Medicine | Admitting: Emergency Medicine

## 2013-09-15 ENCOUNTER — Encounter (HOSPITAL_COMMUNITY): Payer: Self-pay | Admitting: Emergency Medicine

## 2013-09-15 DIAGNOSIS — J45909 Unspecified asthma, uncomplicated: Secondary | ICD-10-CM | POA: Diagnosis not present

## 2013-09-15 DIAGNOSIS — G8929 Other chronic pain: Secondary | ICD-10-CM | POA: Insufficient documentation

## 2013-09-15 DIAGNOSIS — G43909 Migraine, unspecified, not intractable, without status migrainosus: Secondary | ICD-10-CM | POA: Insufficient documentation

## 2013-09-15 LAB — CBG MONITORING, ED: Glucose-Capillary: 87 mg/dL (ref 70–99)

## 2013-09-15 NOTE — ED Notes (Signed)
Pt appeared to have had a syncopal episode.  Walked by waiting room, heard visitor yell, then saw pt on the floor with eyes closed.  Pt was responsive to verbal stimuli.  Dr. Christy Gentles notified and was instructed to obtain ekg, cbg, and vitals.

## 2013-09-15 NOTE — ED Notes (Signed)
Pt given cool wash rag. Pt tolerated well.

## 2013-09-15 NOTE — ED Notes (Signed)
Pt's boyfriend came into triage asking how long pt's wait was going to be. He was informed that there were no open beds, but we would get to her asap. Pt stated she wanted to go home. Pt decided to leave.

## 2013-09-15 NOTE — ED Notes (Signed)
Pt reports headache since this am and reports "new symptoms with MS started yesterday." pt reports "feels like ground glass is inside of both my legs and arms." nad noted. Pt reports sensitivity to light and sound. Speech clear.

## 2013-09-15 NOTE — ED Notes (Addendum)
Pt had unwitnessed syncope episode. Pt reports became very nauseous and placed her head between her legs. Pt reports the next thing she remembers is being in the floor.Pt found lying on floor in waiting room behind triage. Pt aroused by sternal rub, eye lids fluttering at time of assessing pt. Pt alert and oriented at this time.EDP aware and give verbal order for CBG, EKG, v/s.

## 2013-09-16 ENCOUNTER — Encounter (HOSPITAL_COMMUNITY): Payer: Self-pay | Admitting: Emergency Medicine

## 2013-09-16 ENCOUNTER — Emergency Department (HOSPITAL_COMMUNITY)
Admission: EM | Admit: 2013-09-16 | Discharge: 2013-09-16 | Disposition: A | Discharge status: Home / Self Care | Payer: Medicare Other | Attending: Emergency Medicine | Admitting: Emergency Medicine

## 2013-09-16 DIAGNOSIS — F3289 Other specified depressive episodes: Secondary | ICD-10-CM | POA: Diagnosis not present

## 2013-09-16 DIAGNOSIS — J45909 Unspecified asthma, uncomplicated: Secondary | ICD-10-CM | POA: Diagnosis not present

## 2013-09-16 DIAGNOSIS — Z872 Personal history of diseases of the skin and subcutaneous tissue: Secondary | ICD-10-CM | POA: Diagnosis not present

## 2013-09-16 DIAGNOSIS — F329 Major depressive disorder, single episode, unspecified: Secondary | ICD-10-CM | POA: Insufficient documentation

## 2013-09-16 DIAGNOSIS — F17200 Nicotine dependence, unspecified, uncomplicated: Secondary | ICD-10-CM | POA: Insufficient documentation

## 2013-09-16 DIAGNOSIS — G8929 Other chronic pain: Secondary | ICD-10-CM | POA: Diagnosis not present

## 2013-09-16 DIAGNOSIS — G43909 Migraine, unspecified, not intractable, without status migrainosus: Secondary | ICD-10-CM | POA: Diagnosis not present

## 2013-09-16 DIAGNOSIS — R51 Headache: Secondary | ICD-10-CM | POA: Insufficient documentation

## 2013-09-16 DIAGNOSIS — Z79899 Other long term (current) drug therapy: Secondary | ICD-10-CM | POA: Insufficient documentation

## 2013-09-16 DIAGNOSIS — K219 Gastro-esophageal reflux disease without esophagitis: Secondary | ICD-10-CM | POA: Insufficient documentation

## 2013-09-16 DIAGNOSIS — F411 Generalized anxiety disorder: Secondary | ICD-10-CM | POA: Diagnosis not present

## 2013-09-16 DIAGNOSIS — Z8742 Personal history of other diseases of the female genital tract: Secondary | ICD-10-CM | POA: Insufficient documentation

## 2013-09-16 MED ORDER — DIPHENHYDRAMINE HCL 50 MG/ML IJ SOLN
25.0000 mg | Freq: Once | INTRAMUSCULAR | Status: AC
  Administered 2013-09-16: 25 mg via INTRAVENOUS
  Filled 2013-09-16: qty 1

## 2013-09-16 MED ORDER — SODIUM CHLORIDE 0.9 % IV BOLUS (SEPSIS)
1000.0000 mL | Freq: Once | INTRAVENOUS | Status: AC
  Administered 2013-09-16: 1000 mL via INTRAVENOUS

## 2013-09-16 MED ORDER — HYDROMORPHONE HCL PF 1 MG/ML IJ SOLN
1.0000 mg | Freq: Once | INTRAMUSCULAR | Status: AC
  Administered 2013-09-16: 1 mg via INTRAVENOUS
  Filled 2013-09-16: qty 1

## 2013-09-16 MED ORDER — METOCLOPRAMIDE HCL 5 MG/ML IJ SOLN
10.0000 mg | Freq: Once | INTRAMUSCULAR | Status: AC
  Administered 2013-09-16: 10 mg via INTRAVENOUS
  Filled 2013-09-16: qty 2

## 2013-09-16 MED ORDER — DEXAMETHASONE SODIUM PHOSPHATE 10 MG/ML IJ SOLN
10.0000 mg | Freq: Once | INTRAMUSCULAR | Status: AC
  Administered 2013-09-16: 10 mg via INTRAVENOUS
  Filled 2013-09-16: qty 1

## 2013-09-16 NOTE — ED Notes (Addendum)
Pt c/o migraine with nausea x 2 days.  Reports has MS and c/o feeling like has glass in her legs, and says feels like r arm is "being sliced."

## 2013-09-16 NOTE — ED Provider Notes (Signed)
CSN: [id number]     Arrival date & time 09/16/13  0802 History  This chart was scribed for Nat Christen, MD by Ludger Nutting, ED Scribe. This patient was seen in room [facility] and the patient's care was started 8:21 AM.    Chief Complaint  Patient presents with  . Headache   The history is provided by the patient. No language interpreter was used.    HPI Comments: Emily [name] is a 33 y.o. female who presents to the Emergency Department complaining of a new episode of a gradual onset, constant, gradually worsening frontal and right parietal HA that began yesterday. She reports normally taking Lyrica, Topamax, and morphine at home for her symptoms. Patient states she has a history of a migraine HA's and states this is similar. She also has a history of MS with optic neuritis and believes she is having a flare up along with her current HA. No fever, chills, stiff neck.  Severity is moderate.  Past Medical History  Diagnosis Date  . Headache(784.0)     migraines  . Multiple sclerosis   . Asthma as a child  . GERD (gastroesophageal reflux disease)   . Urinary urgency   . Anxiety   . Perforated bowel 2009  . Migraines   . Depression   . Chronic back pain   . Chronic pain   . Ovarian cyst    Past Surgical History  Procedure Laterality Date  . Extremity cyst excision  1994    right leg  . 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 Colostomy closure  04/2007  . Scar revision  01/21/2011    Procedure: SCAR REVISION;  Surgeon: Hermelinda Dellen;  Location: [facility];  Service: Plastics;  Laterality: N/A;  exploration of scar of abdomen and repair of defect  . Hernia repair    . Abdominal surgery    . Appendectomy     No family history on file. History  Substance Use Topics  . Smoking status: Current Some Day Smoker    Types: Cigarettes  . Smokeless tobacco: Never Used     Comment: quit smoking 10 days ago  . Alcohol Use: Yes     Comment: Occ   OB History    Grav Para Term Preterm Abortions TAB SAB Ect Mult Living                 Review of Systems  A complete 10 system review of systems was obtained and all systems are negative except as noted in the HPI and PMH.    Allergies  Amitriptyline; Baclofen; Cymbalta; Gabapentin; Monosodium glutamate; Other; Vicodin; Alprazolam; Magnesium salicylate; Rizatriptan; Tizanidine; Adhesive; and Lamotrigine  Home Medications   Prior to Admission medications   Medication Sig Start Date End Date Taking? Authorizing Provider  carbamazepine (CARBATROL) 200 MG 12 hr capsule Take 200 mg by mouth 3 (three) times daily. 04/22/13 04/22/14 Yes Historical Provider, MD  clonazePAM (KLONOPIN) 1 MG tablet Take 1 mg by mouth 2 (two) times daily as needed for anxiety.    Yes Historical Provider, MD  divalproex (DEPAKOTE ER) 500 MG 24 hr tablet Take 500 mg by mouth every morning. For depression   Yes Historical Provider, MD  EPINEPHrine (EPIPEN) 0.3 mg/0.3 mL SOAJ injection Inject 0.3 mg into the muscle once.   Yes Historical Provider, MD  Fingolimod HCl (GILENYA) 0.5 MG CAPS Take 1 capsule by mouth every morning.    Yes Historical Provider, MD  levonorgestrel (MIRENA) 20 MCG/24HR IUD 1 each by Intrauterine route once.   Yes Historical Provider, MD  loratadine (CLARITIN) 10 MG tablet Take 10 mg by mouth every morning.    Yes Historical Provider, MD  metoCLOPramide (REGLAN) 10 MG tablet Take 1 tablet (10 mg total) by mouth every 6 (six) hours as needed for nausea (or headache). 07/07/04  Yes Delora Fuel, MD  morphine (MSIR) 15 MG tablet Take 15 mg by mouth 4 (four) times daily.   Yes Historical Provider, MD  omeprazole (PRILOSEC) 40 MG capsule Take 40 mg by mouth every morning.    Yes Historical Provider, MD  ondansetron (ZOFRAN) 8 MG tablet Take 1 tablet (8 mg total) by mouth every 4 (four) hours as needed. 08/14/13  Yes Nat Christen, MD  potassium chloride (K-DUR) 10 MEQ tablet Take 10 mEq by mouth 2 (two) times daily.   Yes  Historical Provider, MD  pregabalin (LYRICA) 100 MG capsule Take 300 mg by mouth at bedtime.    Yes Historical Provider, MD  promethazine (PHENERGAN) 25 MG tablet Take 1 tablet (25 mg total) by mouth every 6 (six) hours as needed. 08/17/13  Yes Nat Christen, MD  propranolol (INDERAL) 10 MG tablet Take 10 mg by mouth 2 (two) times daily.   Yes Historical Provider, MD  ranitidine (ZANTAC) 300 MG capsule Take 300 mg by mouth every evening.     Yes Historical Provider, MD  topiramate (TOPAMAX) 100 MG tablet Take 100 mg by mouth 2 (two) times daily.    Yes Historical Provider, MD   BP 149/97  Pulse 87  Temp(Src) 98.1 F (36.7 C) (Oral)  Resp 16  Ht 5\' 5"  (1.651 m)  Wt 200 lb (90.719 kg)  BMI 33.28 kg/m2  SpO2 100% Physical Exam  Nursing note and vitals reviewed. Constitutional: She is oriented to person, place, and time. She appears well-developed and well-nourished.  HENT:  Head: Normocephalic and atraumatic.  Eyes: Conjunctivae and EOM are normal. Pupils are equal, round, and reactive to light.  Photophobic bilaterally   Neck: Normal range of motion. Neck supple.  Cardiovascular: Normal rate, regular rhythm and normal heart sounds.   Pulmonary/Chest: Effort normal and breath sounds normal.  Abdominal: Soft. Bowel sounds are normal.  Musculoskeletal: Normal range of motion.  Neurological: She is alert and oriented to person, place, and time.  Skin: Skin is warm and dry.  Psychiatric: She has a normal mood and affect. Her behavior is normal.    ED Course  Procedures (including critical care time)  DIAGNOSTIC STUDIES: Oxygen Saturation is 100% on RA, normal by my interpretation.    COORDINATION OF CARE: 8:20 AM Will give IV fluids, Dilaudid, Benadryl, Reglan, and Decadron. Discussed treatment plan with pt at bedside and pt agreed to plan.  Labs Review Labs Reviewed - No data to display  Imaging Review No results found.   EKG Interpretation None      MDM   Final  diagnoses:  Migraine without status migrainosus, not intractable, unspecified migraine type    History and physical consistent with migraine headache. No neurological deficits. Patient feels better after IV fluids, IV Decadron, Dilaudid, Benadryl, Reglan  I personally performed the services described in this documentation, which was scribed in my presence. The recorded information has been reviewed and is accurate.   Nat Christen, MD 09/16/13 [phone]

## 2013-09-16 NOTE — Discharge Instructions (Signed)
Increase fluids. Rest in quiet dark room.

## 2013-09-17 ENCOUNTER — Emergency Department (HOSPITAL_COMMUNITY)
Admission: EM | Admit: 2013-09-17 | Discharge: 2013-09-17 | Disposition: A | Discharge status: Home / Self Care | Payer: Medicare Other | Attending: Emergency Medicine | Admitting: Emergency Medicine

## 2013-09-17 ENCOUNTER — Encounter (HOSPITAL_COMMUNITY): Payer: Self-pay | Admitting: Emergency Medicine

## 2013-09-17 ENCOUNTER — Emergency Department (HOSPITAL_COMMUNITY)
Admission: EM | Admit: 2013-09-17 | Discharge: 2013-09-17 | Discharge status: Against Medical Advice | Payer: Medicare Other | Source: Home / Self Care

## 2013-09-17 DIAGNOSIS — F17200 Nicotine dependence, unspecified, uncomplicated: Secondary | ICD-10-CM | POA: Insufficient documentation

## 2013-09-17 DIAGNOSIS — R82998 Other abnormal findings in urine: Secondary | ICD-10-CM | POA: Diagnosis not present

## 2013-09-17 DIAGNOSIS — Z9889 Other specified postprocedural states: Secondary | ICD-10-CM | POA: Diagnosis not present

## 2013-09-17 DIAGNOSIS — J45909 Unspecified asthma, uncomplicated: Secondary | ICD-10-CM | POA: Insufficient documentation

## 2013-09-17 DIAGNOSIS — F411 Generalized anxiety disorder: Secondary | ICD-10-CM | POA: Diagnosis not present

## 2013-09-17 DIAGNOSIS — K219 Gastro-esophageal reflux disease without esophagitis: Secondary | ICD-10-CM | POA: Insufficient documentation

## 2013-09-17 DIAGNOSIS — Z8742 Personal history of other diseases of the female genital tract: Secondary | ICD-10-CM | POA: Insufficient documentation

## 2013-09-17 DIAGNOSIS — G8929 Other chronic pain: Secondary | ICD-10-CM | POA: Insufficient documentation

## 2013-09-17 DIAGNOSIS — K859 Acute pancreatitis without necrosis or infection, unspecified: Secondary | ICD-10-CM | POA: Insufficient documentation

## 2013-09-17 DIAGNOSIS — R109 Unspecified abdominal pain: Secondary | ICD-10-CM

## 2013-09-17 DIAGNOSIS — Z872 Personal history of diseases of the skin and subcutaneous tissue: Secondary | ICD-10-CM | POA: Diagnosis not present

## 2013-09-17 DIAGNOSIS — G43909 Migraine, unspecified, not intractable, without status migrainosus: Secondary | ICD-10-CM | POA: Insufficient documentation

## 2013-09-17 DIAGNOSIS — Z3202 Encounter for pregnancy test, result negative: Secondary | ICD-10-CM | POA: Diagnosis not present

## 2013-09-17 DIAGNOSIS — Z79899 Other long term (current) drug therapy: Secondary | ICD-10-CM | POA: Insufficient documentation

## 2013-09-17 DIAGNOSIS — R350 Frequency of micturition: Secondary | ICD-10-CM | POA: Insufficient documentation

## 2013-09-17 DIAGNOSIS — F3289 Other specified depressive episodes: Secondary | ICD-10-CM | POA: Insufficient documentation

## 2013-09-17 DIAGNOSIS — F329 Major depressive disorder, single episode, unspecified: Secondary | ICD-10-CM | POA: Insufficient documentation

## 2013-09-17 DIAGNOSIS — Z9089 Acquired absence of other organs: Secondary | ICD-10-CM | POA: Diagnosis not present

## 2013-09-17 LAB — CBC WITH DIFFERENTIAL/PLATELET
BASOS ABS: 0 10*3/uL (ref 0.0–0.1)
Basophils Relative: 0 % (ref 0–1)
Eosinophils Absolute: 0.1 10*3/uL (ref 0.0–0.7)
Eosinophils Relative: 0 % (ref 0–5)
HCT: 40.9 % (ref 36.0–46.0)
Hemoglobin: 13.9 g/dL (ref 12.0–15.0)
LYMPHS ABS: 3.1 10*3/uL (ref 0.7–4.0)
Lymphocytes Relative: 27 % (ref 12–46)
MCH: 30.7 pg (ref 26.0–34.0)
MCHC: 34 g/dL (ref 30.0–36.0)
MCV: 90.3 fL (ref 78.0–100.0)
Monocytes Absolute: 0.5 10*3/uL (ref 0.1–1.0)
Monocytes Relative: 5 % (ref 3–12)
NEUTROS ABS: 7.7 10*3/uL (ref 1.7–7.7)
Neutrophils Relative %: 68 % (ref 43–77)
PLATELETS: 346 10*3/uL (ref 150–400)
RBC: 4.53 MIL/uL (ref 3.87–5.11)
RDW: 13.4 % (ref 11.5–15.5)
WBC: 11.4 10*3/uL — ABNORMAL HIGH (ref 4.0–10.5)

## 2013-09-17 LAB — COMPREHENSIVE METABOLIC PANEL
ALT: 16 U/L (ref 0–35)
AST: 16 U/L (ref 0–37)
Albumin: 3.1 g/dL — ABNORMAL LOW (ref 3.5–5.2)
Alkaline Phosphatase: 64 U/L (ref 39–117)
Anion gap: 12 (ref 5–15)
BUN: 9 mg/dL (ref 6–23)
CO2: 23 meq/L (ref 19–32)
Calcium: 8 mg/dL — ABNORMAL LOW (ref 8.4–10.5)
Chloride: 109 mEq/L (ref 96–112)
Creatinine, Ser: 0.51 mg/dL (ref 0.50–1.10)
GFR calc Af Amer: >90 mL/min (ref >90)
Glucose, Bld: 87 mg/dL (ref 70–99)
Potassium: 3.8 mEq/L (ref 3.7–5.3)
SODIUM: 144 meq/L (ref 137–147)
Total Protein: 6.5 g/dL (ref 6.0–8.3)

## 2013-09-17 LAB — LIPASE, BLOOD: Lipase: 145 U/L — ABNORMAL HIGH (ref 11–59)

## 2013-09-17 LAB — URINE MICROSCOPIC-ADD ON

## 2013-09-17 LAB — URINALYSIS, ROUTINE W REFLEX MICROSCOPIC
Bilirubin Urine: NEGATIVE
GLUCOSE, UA: NEGATIVE mg/dL
Ketones, ur: NEGATIVE mg/dL
Leukocytes, UA: NEGATIVE
Nitrite: NEGATIVE
PH: 6 (ref 5.0–8.0)
Protein, ur: NEGATIVE mg/dL
SPECIFIC GRAVITY, URINE: 1.015 (ref 1.005–1.030)
UROBILINOGEN UA: 0.2 mg/dL (ref 0.0–1.0)

## 2013-09-17 LAB — PREGNANCY, URINE: PREG TEST UR: NEGATIVE

## 2013-09-17 MED ORDER — ONDANSETRON HCL 4 MG/2ML IJ SOLN
4.0000 mg | Freq: Once | INTRAMUSCULAR | Status: AC
  Administered 2013-09-17: 4 mg via INTRAVENOUS
  Filled 2013-09-17: qty 2

## 2013-09-17 MED ORDER — KETOROLAC TROMETHAMINE 30 MG/ML IJ SOLN
30.0000 mg | Freq: Once | INTRAMUSCULAR | Status: AC
  Administered 2013-09-17: 30 mg via INTRAVENOUS
  Filled 2013-09-17: qty 1

## 2013-09-17 MED ORDER — LORAZEPAM 2 MG/ML IJ SOLN
1.0000 mg | Freq: Once | INTRAMUSCULAR | Status: AC
  Administered 2013-09-17: 1 mg via INTRAVENOUS
  Filled 2013-09-17: qty 1

## 2013-09-17 MED ORDER — HYDROMORPHONE HCL PF 1 MG/ML IJ SOLN
1.0000 mg | Freq: Once | INTRAMUSCULAR | Status: AC
  Administered 2013-09-17: 1 mg via INTRAVENOUS
  Filled 2013-09-17: qty 1

## 2013-09-17 NOTE — ED Notes (Signed)
Pt c/o pain to her left side that radiates around to the front of her belly. Pt c/o continued pain, bloating, and sob. Pt was seen here earlier and was told she had acute pancreatitis.

## 2013-09-17 NOTE — ED Notes (Signed)
Pt co lt flank pain x 2 days, nausea but denies emesis.

## 2013-09-17 NOTE — ED Notes (Signed)
Patient with no complaints at this time. Respirations even and unlabored. Skin warm/dry. Discharge instructions reviewed with patient at this time. Patient given opportunity to voice concerns/ask questions. IV removed per policy and band-aid applied to site. Patient discharged at this time and left Emergency Department with steady gait.  

## 2013-09-17 NOTE — ED Provider Notes (Signed)
CSN: [id number]     Arrival date & time 09/17/13  4008 History   First MD Initiated Contact with Patient 09/17/13 [phone]     Chief Complaint  Patient presents with  . Flank Pain    Left     (Consider location/radiation/quality/duration/timing/severity/associated sxs/prior Treatment) The history is provided by the patient and the spouse.   Emily [name] is a 33 y.o. female who is well known to this ed (22 visits here in the last 6 months alone) and to [facility] ed presenting with complaint of left sided flank pain which started suddenly 2 days ago and has been constant, and only slightly improved with splinting on that side.  She has had nausea without vomiting, denies fevers or chills, but has had increased urinary frequency and darker urine, consistent with prior episodes of passage of kidney stones. She denies painful urination, vaginal discharge, denies pelvic pain.  She reports she was discharged from [facility] 6 days ago, treated for a "blockage in her stomach" which got better when she vomited.  Her pain today is different, denies abdominal pain and distention.  Her last bowel movement was yesterday and normal  Her past medical history is significant for GERD, history of perforated bowel with bowel resection (thought to be result of chronic steroid use for tx of her MS).  She has chronic abdominal pain.     Past Medical History  Diagnosis Date  . Headache(784.0)     migraines  . Multiple sclerosis   . Asthma as a child  . GERD (gastroesophageal reflux disease)   . Urinary urgency   . Anxiety   . Perforated bowel 2009  . Migraines   . Depression   . Chronic back pain   . Chronic pain   . Ovarian cyst    Past Surgical History  Procedure Laterality Date  . Extremity cyst excision  1994    right leg  . 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 Colostomy closure  04/2007  . Scar revision  01/21/2011    Procedure: SCAR REVISION;  Surgeon: Hermelinda Dellen;  Location: [facility];  Service: Plastics;  Laterality: N/A;  exploration of scar of abdomen and repair of defect  . Hernia repair    . Abdominal surgery    . Appendectomy     History reviewed. No pertinent family history. History  Substance Use Topics  . Smoking status: Current Some Day Smoker    Types: Cigarettes  . Smokeless tobacco: Never Used     Comment: quit smoking 10 days ago  . Alcohol Use: Yes     Comment: Rare   OB History   Grav Para Term Preterm Abortions TAB SAB Ect Mult Living                 Review of Systems  Constitutional: Negative for fever.  HENT: Negative for congestion and sore throat.   Eyes: Negative.   Respiratory: Negative for chest tightness and shortness of breath.   Cardiovascular: Negative for chest pain.  Gastrointestinal: Positive for nausea. Negative for vomiting, abdominal pain and abdominal distention.       Negative except as mentioned in HPI.    Genitourinary: Positive for frequency. Negative for dysuria, vaginal discharge and pelvic pain.  Musculoskeletal: Negative for arthralgias, joint swelling and neck pain.  Skin: Negative.  Negative for rash and wound.  Neurological: Negative for dizziness, weakness, light-headedness, numbness and headaches.  Psychiatric/Behavioral: Negative.  Allergies  Amitriptyline; Baclofen; Cymbalta; Gabapentin; Monosodium glutamate; Other; Vicodin; Alprazolam; Magnesium salicylate; Rizatriptan; Tizanidine; Adhesive; and Lamotrigine  Home Medications   Prior to Admission medications   Medication Sig Start Date End Date Taking? Authorizing Provider  carbamazepine (CARBATROL) 200 MG 12 hr capsule Take 200 mg by mouth 3 (three) times daily. 04/22/13 04/22/14 Yes Historical Provider, MD  clonazePAM (KLONOPIN) 1 MG tablet Take 1 mg by mouth 2 (two) times daily as needed for anxiety.    Yes Historical Provider, MD  divalproex (DEPAKOTE ER) 500 MG 24 hr tablet Take 500 mg by mouth  every morning. For depression   Yes Historical Provider, MD  Fingolimod HCl (GILENYA) 0.5 MG CAPS Take 1 capsule by mouth every morning.    Yes Historical Provider, MD  loratadine (CLARITIN) 10 MG tablet Take 10 mg by mouth every morning.    Yes Historical Provider, MD  metoCLOPramide (REGLAN) 10 MG tablet Take 1 tablet (10 mg total) by mouth every 6 (six) hours as needed for nausea (or headache). 06/11/01  Yes Delora Fuel, MD  morphine (MSIR) 15 MG tablet Take 15 mg by mouth 4 (four) times daily.   Yes Historical Provider, MD  omeprazole (PRILOSEC) 40 MG capsule Take 40 mg by mouth every morning.    Yes Historical Provider, MD  potassium chloride (K-DUR) 10 MEQ tablet Take 10 mEq by mouth 2 (two) times daily.   Yes Historical Provider, MD  pregabalin (LYRICA) 100 MG capsule Take 300 mg by mouth at bedtime.    Yes Historical Provider, MD  promethazine (PHENERGAN) 25 MG tablet Take 1 tablet (25 mg total) by mouth every 6 (six) hours as needed. 08/17/13  Yes Nat Christen, MD  propranolol (INDERAL) 10 MG tablet Take 10 mg by mouth 2 (two) times daily.   Yes Historical Provider, MD  ranitidine (ZANTAC) 300 MG capsule Take 300 mg by mouth every evening.     Yes Historical Provider, MD  topiramate (TOPAMAX) 100 MG tablet Take 100 mg by mouth 2 (two) times daily.    Yes Historical Provider, MD  EPINEPHrine (EPIPEN) 0.3 mg/0.3 mL SOAJ injection Inject 0.3 mg into the muscle once.    Historical Provider, MD  levonorgestrel (MIRENA) 20 MCG/24HR IUD 1 each by Intrauterine route once.    Historical Provider, MD  ondansetron (ZOFRAN) 8 MG tablet Take 1 tablet (8 mg total) by mouth every 4 (four) hours as needed. 08/14/13   Nat Christen, MD   BP 136/92  Pulse 78  Temp(Src) 98.2 F (36.8 C) (Oral)  Resp 16  Ht 5\' 5"  (1.651 m)  Wt 200 lb (90.719 kg)  BMI 33.28 kg/m2  SpO2 97% Physical Exam  Nursing note and vitals reviewed. Constitutional: She is oriented to person, place, and time. She appears well-developed and  well-nourished.  Tearful, lying in fetal position on left side.  HENT:  Head: Normocephalic and atraumatic.  Eyes: Conjunctivae are normal.  Cardiovascular: Regular rhythm, normal heart sounds and intact distal pulses.   Borderline tachy.  Pulmonary/Chest: Effort normal and breath sounds normal. No respiratory distress. She has no wheezes.  Abdominal: Soft. Bowel sounds are normal. She exhibits no distension. There is no tenderness. There is CVA tenderness. There is no rebound and no guarding.  Tender to even light touch of skin at left flank.  Musculoskeletal: Normal range of motion.  Lymphadenopathy:    She has no cervical adenopathy.  Neurological: She is alert and oriented to person, place, and time.  Skin: Skin is  warm and dry.  Psychiatric: Her speech is normal. Her mood appears anxious.    ED Course  Procedures (including critical care time) Labs Review Labs Reviewed  URINALYSIS, ROUTINE W REFLEX MICROSCOPIC - Abnormal; Notable for the following:    Hgb urine dipstick LARGE (*)    All other components within normal limits  CBC WITH DIFFERENTIAL - Abnormal; Notable for the following:    WBC 11.4 (*)    All other components within normal limits  COMPREHENSIVE METABOLIC PANEL - Abnormal; Notable for the following:    Calcium 8.0 (*)    Albumin 3.1 (*)    Total Bilirubin <0.2 (*)    All other components within normal limits  LIPASE, BLOOD - Abnormal; Notable for the following:    Lipase 145 (*)    All other components within normal limits  URINE MICROSCOPIC-ADD ON - Abnormal; Notable for the following:    Squamous Epithelial / LPF FEW (*)    All other components within normal limits  PREGNANCY, URINE    Imaging Review No results found.   EKG Interpretation None      MDM   Final diagnoses:  Acute pancreatitis, unspecified pancreatitis type    Review of past medical records including from [facility].  Multiple ed visits this past week here and [facility].   Last seen at [facility] last night for this same flank pain.  When asked patient about this,  She stated, "Oh, yeah - but they didn't help me".  Further review reveals during her admission, Ct without contrast performed on 09/11/13 revealing no renal stones, but had distended stomach c/w gastroparesis vs gastric outlet obstruction.  Symptoms resolved, and was suspected possibly result of heavy narcotic use.    Discussed frequent ed visits with patient upon obtaining records from [facility] - 3 ed visits there this week also.  Pt also endorses she was seen by her chronic pain specialist 2 days ago at which time she was having a migraine headache, but not the flank pain, was treated with injection (was seen here ytd am for migraine headache as well).  Suggested to patient she needs to f/u with her pcp rather than frequent ed visits.  She states it is hard for her to get in with pcp.  Also suggested to her it is very hard to care for her when she goes back and forth between 2 hospitals.  Pt explains she prefers [facility], but can only come here when she has enough gas.  She lives 20 minutes from here, only 10 minutes from [place].  When picked up by ems - they take her to [facility].  Her chronic pain specialist is in [place]!  Labs reviewed.  Discussed elevated lipase.  On re-exam,  Patient was ttp luq, was not tender on initial exam.  No guarding.  Her pain was not improved with the dilaudid or the toradol given, but she was relaxed, no longer tearful after receiving ativan.  She does have a mild pancreatitis per labs today,  But suspect her pain is heavily triggered by anxiety/psych issues.  She was advised clear liquid diet over the weekend, f/u with her PCP in 3 days for recheck and lab recheck.  Pt understands plan.  Advised return here if she develops uncontrolled vomiting or fevers.        Evalee Jefferson, PA-C 09/19/13 [phone]

## 2013-09-17 NOTE — Discharge Instructions (Signed)
Acute Pancreatitis Acute pancreatitis is a disease in which the pancreas becomes suddenly inflamed. The pancreas is a large gland located behind your stomach. The pancreas produces enzymes that help digest food. The pancreas also releases the hormones glucagon and insulin that help regulate blood sugar. Damage to the pancreas occurs when the digestive enzymes from the pancreas are activated and begin attacking the pancreas before being released into the intestine. Most acute attacks last a couple of days and can cause serious complications. Some people become dehydrated and develop low blood pressure. In severe cases, bleeding into the pancreas can lead to shock and can be life-threatening. The lungs, heart, and kidneys may fail. CAUSES  Pancreatitis can happen to anyone. In some cases, the cause is unknown. Most cases are caused by:  Alcohol abuse.  Gallstones. Other less common causes are:  Certain medicines.  Exposure to certain chemicals.  Infection.  Damage caused by an accident (trauma).  Abdominal surgery. SYMPTOMS   Pain in the upper abdomen that may radiate to the back.  Tenderness and swelling of the abdomen.  Nausea and vomiting. DIAGNOSIS  Your caregiver will perform a physical exam. Blood and stool tests may be done to confirm the diagnosis. Imaging tests may also be done, such as X-rays, CT scans, or an ultrasound of the abdomen. TREATMENT  Treatment usually requires a stay in the hospital. Treatment may include:  Pain medicine.  Fluid replacement through an intravenous line (IV).  Placing a tube in the stomach to remove stomach contents and control vomiting.  Not eating for 3 or 4 days. This gives your pancreas a rest, because enzymes are not being produced that can cause further damage.  Antibiotic medicines if your condition is caused by an infection.  Surgery of the pancreas or gallbladder. HOME CARE INSTRUCTIONS   Follow the diet advised by your  caregiver. This may involve avoiding alcohol and decreasing the amount of fat in your diet.  Eat smaller, more frequent meals. This reduces the amount of digestive juices the pancreas produces.  Drink enough fluids to keep your urine clear or pale yellow.  Only take over-the-counter or prescription medicines as directed by your caregiver.  Avoid drinking alcohol if it caused your condition.  Do not smoke.  Get plenty of rest.  Check your blood sugar at home as directed by your caregiver.  Keep all follow-up appointments as directed by your caregiver. SEEK MEDICAL CARE IF:   You do not recover as quickly as expected.  You develop new or worsening symptoms.  You have persistent pain, weakness, or nausea.  You recover and then have another episode of pain. SEEK IMMEDIATE MEDICAL CARE IF:   You are unable to eat or keep fluids down.  Your pain becomes severe.  You have a fever or persistent symptoms for more than 2 to 3 days.  You have a fever and your symptoms suddenly get worse.  Your skin or the white part of your eyes turn yellow (jaundice).  You develop vomiting.  You feel dizzy, or you faint.  Your blood sugar is high (over 300 mg/dL). MAKE SURE YOU:   Understand these instructions.  Will watch your condition.  Will get help right away if you are not doing well or get worse. Document Released: 12/24/2004 Document Revised: 06/25/2011 Document Reviewed: 04/04/2011 [facility] Patient Information 2015 [place], [place]. This information is not intended to replace advice given to you by your health care provider. Make sure you discuss any questions you have  with your health care provider.   Maintain a clear liquid diet for the next 24 hours, then slowly add regular food back if your pain is better. If it worsens, go back to clear liquids.  See your doctor on Monday for a recheck of your lipase.  You should also have a repeat urinalysis.  You may need further testing if  you continue to have blood in your urine.  You can discuss this with Dr. Wenda Overland.

## 2013-09-17 NOTE — ED Notes (Signed)
MD at bedside. 

## 2013-09-18 ENCOUNTER — Encounter (HOSPITAL_COMMUNITY): Payer: Self-pay | Admitting: Emergency Medicine

## 2013-09-18 ENCOUNTER — Emergency Department (HOSPITAL_COMMUNITY)
Admission: EM | Admit: 2013-09-18 | Discharge: 2013-09-18 | Disposition: A | Discharge status: Home / Self Care | Payer: Medicare Other | Attending: Emergency Medicine | Admitting: Emergency Medicine

## 2013-09-18 DIAGNOSIS — F329 Major depressive disorder, single episode, unspecified: Secondary | ICD-10-CM | POA: Diagnosis not present

## 2013-09-18 DIAGNOSIS — Z9889 Other specified postprocedural states: Secondary | ICD-10-CM | POA: Insufficient documentation

## 2013-09-18 DIAGNOSIS — G35 Multiple sclerosis: Secondary | ICD-10-CM | POA: Insufficient documentation

## 2013-09-18 DIAGNOSIS — K219 Gastro-esophageal reflux disease without esophagitis: Secondary | ICD-10-CM | POA: Insufficient documentation

## 2013-09-18 DIAGNOSIS — R51 Headache: Secondary | ICD-10-CM | POA: Insufficient documentation

## 2013-09-18 DIAGNOSIS — Z79899 Other long term (current) drug therapy: Secondary | ICD-10-CM | POA: Diagnosis not present

## 2013-09-18 DIAGNOSIS — G8929 Other chronic pain: Secondary | ICD-10-CM | POA: Insufficient documentation

## 2013-09-18 DIAGNOSIS — F411 Generalized anxiety disorder: Secondary | ICD-10-CM | POA: Insufficient documentation

## 2013-09-18 DIAGNOSIS — R1084 Generalized abdominal pain: Secondary | ICD-10-CM | POA: Insufficient documentation

## 2013-09-18 DIAGNOSIS — J45909 Unspecified asthma, uncomplicated: Secondary | ICD-10-CM | POA: Diagnosis not present

## 2013-09-18 DIAGNOSIS — R1032 Left lower quadrant pain: Secondary | ICD-10-CM | POA: Diagnosis not present

## 2013-09-18 DIAGNOSIS — G43909 Migraine, unspecified, not intractable, without status migrainosus: Secondary | ICD-10-CM | POA: Insufficient documentation

## 2013-09-18 DIAGNOSIS — F3289 Other specified depressive episodes: Secondary | ICD-10-CM | POA: Insufficient documentation

## 2013-09-18 DIAGNOSIS — R109 Unspecified abdominal pain: Secondary | ICD-10-CM

## 2013-09-18 DIAGNOSIS — N949 Unspecified condition associated with female genital organs and menstrual cycle: Secondary | ICD-10-CM | POA: Diagnosis not present

## 2013-09-18 DIAGNOSIS — G43809 Other migraine, not intractable, without status migrainosus: Secondary | ICD-10-CM | POA: Insufficient documentation

## 2013-09-18 DIAGNOSIS — Z9089 Acquired absence of other organs: Secondary | ICD-10-CM | POA: Diagnosis not present

## 2013-09-18 DIAGNOSIS — F17200 Nicotine dependence, unspecified, uncomplicated: Secondary | ICD-10-CM | POA: Insufficient documentation

## 2013-09-18 DIAGNOSIS — Z8742 Personal history of other diseases of the female genital tract: Secondary | ICD-10-CM | POA: Diagnosis not present

## 2013-09-18 LAB — COMPREHENSIVE METABOLIC PANEL
ALT: 17 U/L (ref 0–35)
ANION GAP: 12 (ref 5–15)
AST: 13 U/L (ref 0–37)
Albumin: 3.3 g/dL — ABNORMAL LOW (ref 3.5–5.2)
Alkaline Phosphatase: 75 U/L (ref 39–117)
BUN: 16 mg/dL (ref 6–23)
CO2: 24 mEq/L (ref 19–32)
Calcium: 8.8 mg/dL (ref 8.4–10.5)
Chloride: 108 mEq/L (ref 96–112)
Creatinine, Ser: 0.67 mg/dL (ref 0.50–1.10)
GFR calc non Af Amer: >90 mL/min (ref >90)
GLUCOSE: 83 mg/dL (ref 70–99)
Potassium: 4.1 mEq/L (ref 3.7–5.3)
Sodium: 144 mEq/L (ref 137–147)
TOTAL PROTEIN: 6.5 g/dL (ref 6.0–8.3)
Total Bilirubin: <0.2 mg/dL — ABNORMAL LOW (ref 0.3–1.2)

## 2013-09-18 LAB — CBC WITH DIFFERENTIAL/PLATELET
Basophils Absolute: 0 10*3/uL (ref 0.0–0.1)
Basophils Relative: 0 % (ref 0–1)
EOS ABS: 0.1 10*3/uL (ref 0.0–0.7)
EOS PCT: 1 % (ref 0–5)
HEMATOCRIT: 39.9 % (ref 36.0–46.0)
HEMOGLOBIN: 13.7 g/dL (ref 12.0–15.0)
Lymphocytes Relative: 27 % (ref 12–46)
Lymphs Abs: 2.6 10*3/uL (ref 0.7–4.0)
MCH: 31 pg (ref 26.0–34.0)
MCHC: 34.3 g/dL (ref 30.0–36.0)
MCV: 90.3 fL (ref 78.0–100.0)
MONO ABS: 0.6 10*3/uL (ref 0.1–1.0)
MONOS PCT: 6 % (ref 3–12)
Neutro Abs: 6.5 10*3/uL (ref 1.7–7.7)
Neutrophils Relative %: 66 % (ref 43–77)
Platelets: 343 10*3/uL (ref 150–400)
RBC: 4.42 MIL/uL (ref 3.87–5.11)
RDW: 13.5 % (ref 11.5–15.5)
WBC: 9.7 10*3/uL (ref 4.0–10.5)

## 2013-09-18 LAB — LIPASE, BLOOD: Lipase: 32 U/L (ref 11–59)

## 2013-09-18 MED ORDER — OXYCODONE-ACETAMINOPHEN 5-325 MG PO TABS
2.0000 | ORAL_TABLET | Freq: Once | ORAL | Status: AC
  Administered 2013-09-18: 2 via ORAL
  Filled 2013-09-18: qty 2

## 2013-09-18 MED ORDER — HYDROMORPHONE HCL PF 1 MG/ML IJ SOLN
1.0000 mg | Freq: Once | INTRAMUSCULAR | Status: AC
  Administered 2013-09-18: 1 mg via INTRAMUSCULAR
  Filled 2013-09-18: qty 1

## 2013-09-18 MED ORDER — DEXAMETHASONE SODIUM PHOSPHATE 10 MG/ML IJ SOLN
10.0000 mg | Freq: Once | INTRAMUSCULAR | Status: AC
  Administered 2013-09-18: 10 mg via INTRAMUSCULAR
  Filled 2013-09-18: qty 1

## 2013-09-18 MED ORDER — DIPHENHYDRAMINE HCL 50 MG/ML IJ SOLN
25.0000 mg | Freq: Once | INTRAMUSCULAR | Status: AC
  Administered 2013-09-18: 25 mg via INTRAMUSCULAR
  Filled 2013-09-18: qty 1

## 2013-09-18 MED ORDER — ONDANSETRON 4 MG PO TBDP
4.0000 mg | ORAL_TABLET | Freq: Once | ORAL | Status: AC
  Administered 2013-09-18: 4 mg via ORAL
  Filled 2013-09-18: qty 1

## 2013-09-18 MED ORDER — METOCLOPRAMIDE HCL 5 MG/ML IJ SOLN
10.0000 mg | Freq: Once | INTRAMUSCULAR | Status: AC
  Administered 2013-09-18: 10 mg via INTRAMUSCULAR
  Filled 2013-09-18: qty 2

## 2013-09-18 NOTE — ED Notes (Signed)
Pt states migraine is completely gone at this time.

## 2013-09-18 NOTE — ED Provider Notes (Signed)
CSN: [id number]     Arrival date & time 09/18/13  1153 History   First MD Initiated Contact with Patient 09/18/13 1348     Chief Complaint  Patient presents with  . Abdominal Pain  . Migraine     (Consider location/radiation/quality/duration/timing/severity/associated sxs/prior Treatment) HPI Emily [name] is a 33 y.o. female with history of MS, migraines, chronic pain, ovarian cyst, who presents to ED with complaint of headache and abdominal pain. Pt states she has had lower abdominal pain for few weeks. States has had multiple tests done which showed an ovarian cyst, and state a week ago was seen at [facility] ED and had CT scan which showed "gastric outlet obstruction." States they were going to transfer her to [place] but states she did not want to go there. She was again seen in ED here last night, was treated for pain and had CT scan done which was normal. Pt was discharged home. Pt also states her abdominal pain must have triggered her migraine. States took Imitrex and all of her regular pain medications with no relief of her symptoms. Pt denies any fever, chills, neck pain. No injuries.   Past Medical History  Diagnosis Date  . Headache(784.0)     migraines  . Multiple sclerosis   . Asthma as a child  . GERD (gastroesophageal reflux disease)   . Urinary urgency   . Anxiety   . Perforated bowel 2009  . Migraines   . Depression   . Chronic back pain   . Chronic pain   . Ovarian cyst    Past Surgical History  Procedure Laterality Date  . Extremity cyst excision  1994    right leg  . 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 Colostomy closure  04/2007  . Scar revision  01/21/2011    Procedure: SCAR REVISION;  Surgeon: Hermelinda Dellen;  Location: [place];  Service: Plastics;  Laterality: N/A;  exploration of scar of abdomen and repair of defect  . Hernia repair    . Abdominal surgery    . Appendectomy     No family history on file. History   Substance Use Topics  . Smoking status: Current Some Day Smoker    Types: Cigarettes  . Smokeless tobacco: Never Used     Comment: quit smoking 10 days ago  . Alcohol Use: Yes     Comment: Rare   OB History   Grav Para Term Preterm Abortions TAB SAB Ect Mult Living                 Review of Systems  Constitutional: Negative for fever and chills.  Eyes: Positive for photophobia.  Respiratory: Negative for cough, chest tightness and shortness of breath.   Cardiovascular: Negative for chest pain, palpitations and leg swelling.  Gastrointestinal: Positive for nausea and abdominal pain. Negative for vomiting and diarrhea.  Genitourinary: Positive for pelvic pain. Negative for dysuria, flank pain, vaginal bleeding, vaginal discharge and vaginal pain.  Musculoskeletal: Negative for arthralgias, myalgias, neck pain and neck stiffness.  Skin: Negative for rash.  Neurological: Positive for headaches. Negative for dizziness and weakness.  All other systems reviewed and are negative.     Allergies  Amitriptyline; Baclofen; Cymbalta; Gabapentin; Monosodium glutamate; Other; Vicodin; Alprazolam; Magnesium salicylate; Rizatriptan; Tizanidine; Adhesive; and Lamotrigine  Home Medications   Prior to Admission medications   Medication Sig Start Date End Date Taking? Authorizing Provider  carbamazepine (CARBATROL) 200 MG 12 hr  capsule Take 200 mg by mouth 3 (three) times daily. 04/22/13 04/22/14  Historical Provider, MD  clonazePAM (KLONOPIN) 1 MG tablet Take 1 mg by mouth 2 (two) times daily as needed for anxiety.     Historical Provider, MD  divalproex (DEPAKOTE ER) 500 MG 24 hr tablet Take 500 mg by mouth every morning. For depression    Historical Provider, MD  EPINEPHrine (EPIPEN) 0.3 mg/0.3 mL SOAJ injection Inject 0.3 mg into the muscle once.    Historical Provider, MD  Fingolimod HCl (GILENYA) 0.5 MG CAPS Take 1 capsule by mouth every morning.     Historical Provider, MD  levonorgestrel  (MIRENA) 20 MCG/24HR IUD 1 each by Intrauterine route once.    Historical Provider, MD  loratadine (CLARITIN) 10 MG tablet Take 10 mg by mouth every morning.     Historical Provider, MD  metoCLOPramide (REGLAN) 10 MG tablet Take 1 tablet (10 mg total) by mouth every 6 (six) hours as needed for nausea (or headache). 04/09/66   Delora Fuel, MD  morphine (MSIR) 15 MG tablet Take 15 mg by mouth 4 (four) times daily.    Historical Provider, MD  omeprazole (PRILOSEC) 40 MG capsule Take 40 mg by mouth every morning.     Historical Provider, MD  ondansetron (ZOFRAN) 8 MG tablet Take 1 tablet (8 mg total) by mouth every 4 (four) hours as needed. 08/14/13   Nat Christen, MD  potassium chloride (K-DUR) 10 MEQ tablet Take 10 mEq by mouth 2 (two) times daily.    Historical Provider, MD  pregabalin (LYRICA) 100 MG capsule Take 300 mg by mouth at bedtime.     Historical Provider, MD  promethazine (PHENERGAN) 25 MG tablet Take 1 tablet (25 mg total) by mouth every 6 (six) hours as needed. 08/17/13   Nat Christen, MD  propranolol (INDERAL) 10 MG tablet Take 10 mg by mouth 2 (two) times daily.    Historical Provider, MD  ranitidine (ZANTAC) 300 MG capsule Take 300 mg by mouth every evening.      Historical Provider, MD  topiramate (TOPAMAX) 100 MG tablet Take 100 mg by mouth 2 (two) times daily.     Historical Provider, MD   BP 145/99  Pulse 85  Temp(Src) 97.8 F (36.6 C) (Oral)  Resp 17  Ht 5\' 5"  (1.651 m)  Wt 200 lb (90.719 kg)  BMI 33.28 kg/m2  SpO2 99% Physical Exam  Nursing note and vitals reviewed. Constitutional: She is oriented to person, place, and time. She appears well-developed and well-nourished. No distress.  Eyes: Conjunctivae and EOM are normal. Pupils are equal, round, and reactive to light.  Neck: Neck supple.  Cardiovascular: Normal rate, regular rhythm and normal heart sounds.   Pulmonary/Chest: Effort normal and breath sounds normal. No respiratory distress. She has no wheezes. She has no  rales.  Abdominal: Soft. Bowel sounds are normal. She exhibits no distension. There is tenderness. There is no rebound and no guarding.  LLQ tenderness  Neurological: She is alert and oriented to person, place, and time. No cranial nerve deficit. Coordination normal.  Skin: Skin is warm and dry.  Psychiatric:  Pt is tearful    ED Course  Procedures (including critical care time) Labs Review Labs Reviewed  COMPREHENSIVE METABOLIC PANEL - Abnormal; Notable for the following:    Albumin 3.3 (*)    Total Bilirubin <0.2 (*)    All other components within normal limits  CBC WITH DIFFERENTIAL  LIPASE, BLOOD    Imaging Review  No results found.   EKG Interpretation None      MDM   Final diagnoses:  Other migraine without status migrainosus, not intractable    Pt with headache, abdominal pain. Multiple visits here for the same. CT abd/pelvis yesterday normal. Labs normal. Pt is tearful, requesting medications for her migraine. Hx of the same. No meningismus. Afebrile. Already took morphine, robaxin, as well as the rest of her medications these morning. VS normal. Abdomen benign. Will recheck labs. Pt requesting migraine coctail, which includes decadron, reglan, benadryl, dilaudid.    4:14 PM Pt feeling much better after medications. Labs normal. She is requesting discharge home. Pt discharged home with no prescriptions. Follow up with neurology and obgyn regarding ovarian cyst. Pt agreeable to plan. Return precautions discussed. Questions answered.   Filed Vitals:   09/18/13 1200 09/18/13 1433 09/18/13 1436  BP: 139/96  145/99  Pulse: 109 85   Temp: 97.8 F (36.6 C)    TempSrc: Oral    Resp: 18 17   Height: 5\' 5"  (1.651 m)    Weight: 200 lb (90.719 kg)    SpO2: 100% 99%        Renold Genta, PA-C 09/18/13 1615

## 2013-09-18 NOTE — ED Notes (Addendum)
Pt presents to ED with migraine that began a couple days ago. She cannot get relief. Pt states this does not feel like a normal migraine, normal migraines to her are on her right side of her head; pt states this one is more on the left side and feels "like a hammer in her head." Pt alert & oriented x 4. Pt neuro exam remarkable. Pt states abdominal pain is from an ovarian cyst.

## 2013-09-18 NOTE — ED Notes (Signed)
Patient arrives with complaint of abdominal pain which has been ongoing for approximately 1 week. States that she left [facility] hospital after they stated intention to transfer her to [facility] for further treatment by Gastroenterologist. Patient was admitted to [facility] with complaint of mid abdominal pain 1 week ago, stayed for 3 days, and was discharged while still experiencing symptoms. States that she feels worse now and the pain has changed. Pain is reported as left upper abdominal pain which wraps laterally towards her left flank. Describes pain as pressure and "bursting". Patient brought with her evidence of her workup which was done tonight at [place], imaging results, and CD containing images.

## 2013-09-18 NOTE — Discharge Instructions (Signed)
Chronic Pain Emily [name], you were seen today for abdominal pain.  Your labs and CT scan from [name] were normal tonight.  Follow up with your regular doctor for continued treatment and evaluation of your pain.  If your symptoms worsen, return to the ED for repeat evaluation.  Thank you. Chronic pain can be defined as pain that is off and on and lasts for 3-6 months or longer. Many things cause chronic pain, which can make it difficult to make a diagnosis. There are many treatment options available for chronic pain. However, finding a treatment that works well for you may require trying various approaches until the right one is found. Many people benefit from a combination of two or more types of treatment to control their pain. SYMPTOMS  Chronic pain can occur anywhere in the body and can range from mild to very severe. Some types of chronic pain include:  Headache.  Low back pain.  Cancer pain.  Arthritis pain.  Neurogenic pain. This is pain resulting from damage to nerves. People with chronic pain may also have other symptoms such as:  Depression.  Anger.  Insomnia.  Anxiety. DIAGNOSIS  Your health care provider will help diagnose your condition over time. In many cases, the initial focus will be on excluding possible conditions that could be causing the pain. Depending on your symptoms, your health care provider may order tests to diagnose your condition. Some of these tests may include:   Blood tests.   CT scan.   MRI.   X-rays.   Ultrasounds.   Nerve conduction studies.  You may need to see a specialist.  TREATMENT  Finding treatment that works well may take time. You may be referred to a pain specialist. He or she may prescribe medicine or therapies, such as:   Mindful meditation or yoga.  Shots (injections) of numbing or pain-relieving medicines into the spine or area of pain.  Local electrical stimulation.  Acupuncture.   Massage therapy.    Aroma, color, light, or sound therapy.   Biofeedback.   Working with a physical therapist to keep from getting stiff.   Regular, gentle exercise.   Cognitive or behavioral therapy.   Group support.  Sometimes, surgery may be recommended.  HOME CARE INSTRUCTIONS   Take all medicines as directed by your health care provider.   Lessen stress in your life by relaxing and doing things such as listening to calming music.   Exercise or be active as directed by your health care provider.   Eat a healthy diet and include things such as vegetables, fruits, fish, and lean meats in your diet.   Keep all follow-up appointments with your health care provider.   Attend a support group with others suffering from chronic pain. SEEK MEDICAL CARE IF:   Your pain gets worse.   You develop a new pain that was not there before.   You cannot tolerate medicines given to you by your health care provider.   You have new symptoms since your last visit with your health care provider.  SEEK IMMEDIATE MEDICAL CARE IF:   You feel weak.   You have decreased sensation or numbness.   You lose control of bowel or bladder function.   Your pain suddenly gets much worse.   You develop shaking.  You develop chills.  You develop confusion.  You develop chest pain.  You develop shortness of breath.  MAKE SURE YOU:  Understand these instructions.  Will watch your condition.  Will get help right away if you are not doing well or get worse. Document Released: 09/15/2001 Document Revised: 08/26/2012 Document Reviewed: 06/19/2012 [facility] Patient Information 2015 [place], [place]. This information is not intended to replace advice given to you by your health care provider. Make sure you discuss any questions you have with your health care provider.

## 2013-09-18 NOTE — ED Provider Notes (Signed)
CSN: [id number]     Arrival date & time 09/18/13  0219 History   First MD Initiated Contact with Patient 09/18/13 [phone]     Chief Complaint  Patient presents with  . Abdominal Pain     (Consider location/radiation/quality/duration/timing/severity/associated sxs/prior Treatment) HPI  Emily [name] is a 33 y.o. female with a past medical history of multiple sclerosis and gastroparesis coming in with abdominal pain. Patient states it's on her left side and radiates to her back. This been going on for 3 days and as a pressure sensation. She was also seen at [facility] for the same approximately one week ago. At that time she required NG tube for gastric outlet obstruction. Earlier this evening the patient was at [facility], where laboratory studies and a CT scan was performed that did not reveal any acute etiology for her pain. She states that they wanted to admit her for a swollen stomach and pancreatitis. Patient is denying any fevers or recent infections. She states her urination and bowel movements have been normal. She has no vaginal complaints.  10 Systems reviewed and are negative for acute change except as noted in the HPI.     Past Medical History  Diagnosis Date  . Headache(784.0)     migraines  . Multiple sclerosis   . Asthma as a child  . GERD (gastroesophageal reflux disease)   . Urinary urgency   . Anxiety   . Perforated bowel 2009  . Migraines   . Depression   . Chronic back pain   . Chronic pain   . Ovarian cyst    Past Surgical History  Procedure Laterality Date  . Extremity cyst excision  1994    right leg  . 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 Colostomy closure  04/2007  . Scar revision  01/21/2011    Procedure: SCAR REVISION;  Surgeon: Hermelinda Dellen;  Location: [place];  Service: Plastics;  Laterality: N/A;  exploration of scar of abdomen and repair of defect  . Hernia repair    . Abdominal surgery    . Appendectomy      No family history on file. History  Substance Use Topics  . Smoking status: Current Some Day Smoker    Types: Cigarettes  . Smokeless tobacco: Never Used     Comment: quit smoking 10 days ago  . Alcohol Use: Yes     Comment: Rare   OB History   Grav Para Term Preterm Abortions TAB SAB Ect Mult Living                 Review of Systems    Allergies  Amitriptyline; Baclofen; Cymbalta; Gabapentin; Monosodium glutamate; Other; Vicodin; Alprazolam; Magnesium salicylate; Rizatriptan; Tizanidine; Adhesive; and Lamotrigine  Home Medications   Prior to Admission medications   Medication Sig Start Date End Date Taking? Authorizing Provider  carbamazepine (CARBATROL) 200 MG 12 hr capsule Take 200 mg by mouth 3 (three) times daily. 04/22/13 04/22/14 Yes Historical Provider, MD  clonazePAM (KLONOPIN) 1 MG tablet Take 1 mg by mouth 2 (two) times daily as needed for anxiety.    Yes Historical Provider, MD  divalproex (DEPAKOTE ER) 500 MG 24 hr tablet Take 500 mg by mouth every morning. For depression   Yes Historical Provider, MD  Fingolimod HCl (GILENYA) 0.5 MG CAPS Take 1 capsule by mouth every morning.    Yes Historical Provider, MD  loratadine (CLARITIN) 10 MG tablet Take 10  mg by mouth every morning.    Yes Historical Provider, MD  metoCLOPramide (REGLAN) 10 MG tablet Take 1 tablet (10 mg total) by mouth every 6 (six) hours as needed for nausea (or headache). 4/0/98  Yes Delora Fuel, MD  morphine (MSIR) 15 MG tablet Take 15 mg by mouth 4 (four) times daily.   Yes Historical Provider, MD  omeprazole (PRILOSEC) 40 MG capsule Take 40 mg by mouth every morning.    Yes Historical Provider, MD  ondansetron (ZOFRAN) 8 MG tablet Take 1 tablet (8 mg total) by mouth every 4 (four) hours as needed. 08/14/13  Yes Nat Christen, MD  potassium chloride (K-DUR) 10 MEQ tablet Take 10 mEq by mouth 2 (two) times daily.   Yes Historical Provider, MD  pregabalin (LYRICA) 100 MG capsule Take 300 mg by mouth at  bedtime.    Yes Historical Provider, MD  promethazine (PHENERGAN) 25 MG tablet Take 1 tablet (25 mg total) by mouth every 6 (six) hours as needed. 08/17/13  Yes Nat Christen, MD  propranolol (INDERAL) 10 MG tablet Take 10 mg by mouth 2 (two) times daily.   Yes Historical Provider, MD  ranitidine (ZANTAC) 300 MG capsule Take 300 mg by mouth every evening.     Yes Historical Provider, MD  topiramate (TOPAMAX) 100 MG tablet Take 100 mg by mouth 2 (two) times daily.    Yes Historical Provider, MD  EPINEPHrine (EPIPEN) 0.3 mg/0.3 mL SOAJ injection Inject 0.3 mg into the muscle once.    Historical Provider, MD  levonorgestrel (MIRENA) 20 MCG/24HR IUD 1 each by Intrauterine route once.    Historical Provider, MD   BP 135/88  Pulse 96  Temp(Src) 98.1 F (36.7 C) (Oral)  Resp 18  Ht 5\' 5"  (1.651 m)  Wt 200 lb (90.719 kg)  BMI 33.28 kg/m2  SpO2 99% Physical Exam  Nursing note and vitals reviewed. Constitutional: She is oriented to person, place, and time. She appears well-developed and well-nourished. No distress.  HENT:  Head: Normocephalic and atraumatic.  Nose: Nose normal.  Mouth/Throat: Oropharynx is clear and moist. No oropharyngeal exudate.  Eyes: Conjunctivae and EOM are normal. Pupils are equal, round, and reactive to light. No scleral icterus.  Neck: Normal range of motion. Neck supple. No JVD present. No tracheal deviation present. No thyromegaly present.  Cardiovascular: Normal rate, regular rhythm and normal heart sounds.  Exam reveals no gallop and no friction rub.   No murmur heard. Pulmonary/Chest: Effort normal and breath sounds normal. No respiratory distress. She has no wheezes. She exhibits no tenderness.  Abdominal: Soft. Bowel sounds are normal. She exhibits no distension and no mass. There is no tenderness. There is no rebound and no guarding.  Diffuse abdominal tenderness, easily distractible by conversation. No tenderness when pushing with stethoscope.  Musculoskeletal:  Normal range of motion. She exhibits no edema and no tenderness.  Lymphadenopathy:    She has no cervical adenopathy.  Neurological: She is alert and oriented to person, place, and time. No cranial nerve deficit. She exhibits normal muscle tone.  Skin: Skin is warm and dry. No rash noted. She is not diaphoretic. No erythema. No pallor.    ED Course  Procedures (including critical care time) Labs Review Labs Reviewed - No data to display  Imaging Review No results found.   EKG Interpretation None      MDM   Final diagnoses:  None    Recent presents emergency department out of concern for abdominal pain. She's been  recently seen in multiple emergency departments over the past couple days. I do have concern for drug-seeking behavior in this patient. All laboratory studies and CT scans were done tonight at [facility] and were negative. Sig given Percocet and Zofran emergency department. Will be discharged home follow with her primary care physician for continued care. Vital signs remained stable, patient is safe for discharge.   Everlene Balls, MD 09/18/13 [phone]

## 2013-09-18 NOTE — ED Notes (Signed)
Discharge instructions discussed w patient. Pt fiance driving her home.

## 2013-09-18 NOTE — ED Notes (Signed)
Pt. Stated, i was here last night and had the same symptoms except I have a migraine headache now.  I still having abdominal pain and having a headache.

## 2013-09-18 NOTE — ED Notes (Signed)
Pt A&Ox4, ambulatory at d/c with steady gait, NAD 

## 2013-09-18 NOTE — Discharge Instructions (Signed)
Please follow up with your neurologist regarding your migraine headache and MS. Follow up with GYN for ovarian cyst. Continue current medications. Return if worsening symptoms.    Migraine Headache A migraine headache is an intense, throbbing pain on one or both sides of your head. A migraine can last for 30 minutes to several hours. CAUSES  The exact cause of a migraine headache is not always known. However, a migraine may be caused when nerves in the brain become irritated and release chemicals that cause inflammation. This causes pain. Certain things may also trigger migraines, such as:  Alcohol.  Smoking.  Stress.  Menstruation.  Aged cheeses.  Foods or drinks that contain nitrates, glutamate, aspartame, or tyramine.  Lack of sleep.  Chocolate.  Caffeine.  Hunger.  Physical exertion.  Fatigue.  Medicines used to treat chest pain (nitroglycerine), birth control pills, estrogen, and some blood pressure medicines. SIGNS AND SYMPTOMS  Pain on one or both sides of your head.  Pulsating or throbbing pain.  Severe pain that prevents daily activities.  Pain that is aggravated by any physical activity.  Nausea, vomiting, or both.  Dizziness.  Pain with exposure to bright lights, loud noises, or activity.  General sensitivity to bright lights, loud noises, or smells. Before you get a migraine, you may get warning signs that a migraine is coming (aura). An aura may include:  Seeing flashing lights.  Seeing bright spots, halos, or zigzag lines.  Having tunnel vision or blurred vision.  Having feelings of numbness or tingling.  Having trouble talking.  Having muscle weakness. DIAGNOSIS  A migraine headache is often diagnosed based on:  Symptoms.  Physical exam.  A CT scan or MRI of your head. These imaging tests cannot diagnose migraines, but they can help rule out other causes of headaches. TREATMENT Medicines may be given for pain and nausea.  Medicines can also be given to help prevent recurrent migraines.  HOME CARE INSTRUCTIONS  Only take over-the-counter or prescription medicines for pain or discomfort as directed by your health care provider. The use of long-term narcotics is not recommended.  Lie down in a dark, quiet room when you have a migraine.  Keep a journal to find out what may trigger your migraine headaches. For example, write down:  What you eat and drink.  How much sleep you get.  Any change to your diet or medicines.  Limit alcohol consumption.  Quit smoking if you smoke.  Get 7-9 hours of sleep, or as recommended by your health care provider.  Limit stress.  Keep lights dim if bright lights bother you and make your migraines worse. SEEK IMMEDIATE MEDICAL CARE IF:   Your migraine becomes severe.  You have a fever.  You have a stiff neck.  You have vision loss.  You have muscular weakness or loss of muscle control.  You start losing your balance or have trouble walking.  You feel faint or pass out.  You have severe symptoms that are different from your first symptoms. MAKE SURE YOU:   Understand these instructions.  Will watch your condition.  Will get help right away if you are not doing well or get worse. Document Released: 12/24/2004 Document Revised: 05/10/2013 Document Reviewed: 08/31/2012 [facility] Patient Information 2015 [place], [place]. This information is not intended to replace advice given to you by your health care provider. Make sure you discuss any questions you have with your health care provider.

## 2013-09-19 ENCOUNTER — Emergency Department (HOSPITAL_COMMUNITY): Payer: Medicare Other

## 2013-09-19 ENCOUNTER — Encounter (HOSPITAL_COMMUNITY): Payer: Self-pay | Admitting: Emergency Medicine

## 2013-09-19 ENCOUNTER — Emergency Department (HOSPITAL_COMMUNITY)
Admission: EM | Admit: 2013-09-19 | Discharge: 2013-09-19 | Disposition: A | Discharge status: Home / Self Care | Payer: Medicare Other | Attending: Emergency Medicine | Admitting: Emergency Medicine

## 2013-09-19 DIAGNOSIS — K219 Gastro-esophageal reflux disease without esophagitis: Secondary | ICD-10-CM | POA: Diagnosis not present

## 2013-09-19 DIAGNOSIS — W19XXXA Unspecified fall, initial encounter: Secondary | ICD-10-CM

## 2013-09-19 DIAGNOSIS — S4980XA Other specified injuries of shoulder and upper arm, unspecified arm, initial encounter: Secondary | ICD-10-CM | POA: Insufficient documentation

## 2013-09-19 DIAGNOSIS — F17200 Nicotine dependence, unspecified, uncomplicated: Secondary | ICD-10-CM | POA: Diagnosis not present

## 2013-09-19 DIAGNOSIS — S46909A Unspecified injury of unspecified muscle, fascia and tendon at shoulder and upper arm level, unspecified arm, initial encounter: Secondary | ICD-10-CM | POA: Insufficient documentation

## 2013-09-19 DIAGNOSIS — G43909 Migraine, unspecified, not intractable, without status migrainosus: Secondary | ICD-10-CM | POA: Insufficient documentation

## 2013-09-19 DIAGNOSIS — F411 Generalized anxiety disorder: Secondary | ICD-10-CM | POA: Diagnosis not present

## 2013-09-19 DIAGNOSIS — Z79899 Other long term (current) drug therapy: Secondary | ICD-10-CM | POA: Diagnosis not present

## 2013-09-19 DIAGNOSIS — Y9210 Unspecified residential institution as the place of occurrence of the external cause: Secondary | ICD-10-CM | POA: Insufficient documentation

## 2013-09-19 DIAGNOSIS — S0990XA Unspecified injury of head, initial encounter: Secondary | ICD-10-CM | POA: Diagnosis not present

## 2013-09-19 DIAGNOSIS — S59919A Unspecified injury of unspecified forearm, initial encounter: Secondary | ICD-10-CM

## 2013-09-19 DIAGNOSIS — F3289 Other specified depressive episodes: Secondary | ICD-10-CM | POA: Insufficient documentation

## 2013-09-19 DIAGNOSIS — F329 Major depressive disorder, single episode, unspecified: Secondary | ICD-10-CM | POA: Diagnosis not present

## 2013-09-19 DIAGNOSIS — M25512 Pain in left shoulder: Secondary | ICD-10-CM

## 2013-09-19 DIAGNOSIS — S59909A Unspecified injury of unspecified elbow, initial encounter: Secondary | ICD-10-CM | POA: Diagnosis not present

## 2013-09-19 DIAGNOSIS — S6990XA Unspecified injury of unspecified wrist, hand and finger(s), initial encounter: Secondary | ICD-10-CM | POA: Diagnosis not present

## 2013-09-19 DIAGNOSIS — G8929 Other chronic pain: Secondary | ICD-10-CM | POA: Diagnosis not present

## 2013-09-19 DIAGNOSIS — R296 Repeated falls: Secondary | ICD-10-CM | POA: Insufficient documentation

## 2013-09-19 DIAGNOSIS — M25522 Pain in left elbow: Secondary | ICD-10-CM

## 2013-09-19 DIAGNOSIS — J45909 Unspecified asthma, uncomplicated: Secondary | ICD-10-CM | POA: Insufficient documentation

## 2013-09-19 DIAGNOSIS — S79929A Unspecified injury of unspecified thigh, initial encounter: Secondary | ICD-10-CM

## 2013-09-19 DIAGNOSIS — Y939 Activity, unspecified: Secondary | ICD-10-CM | POA: Diagnosis not present

## 2013-09-19 DIAGNOSIS — S79919A Unspecified injury of unspecified hip, initial encounter: Secondary | ICD-10-CM | POA: Diagnosis not present

## 2013-09-19 DIAGNOSIS — Z8742 Personal history of other diseases of the female genital tract: Secondary | ICD-10-CM | POA: Diagnosis not present

## 2013-09-19 MED ORDER — DEXAMETHASONE SODIUM PHOSPHATE 10 MG/ML IJ SOLN
10.0000 mg | Freq: Once | INTRAMUSCULAR | Status: AC
  Administered 2013-09-19: 10 mg via INTRAMUSCULAR
  Filled 2013-09-19: qty 1

## 2013-09-19 MED ORDER — HYDROMORPHONE HCL PF 1 MG/ML IJ SOLN
1.0000 mg | Freq: Once | INTRAMUSCULAR | Status: AC
  Administered 2013-09-19: 1 mg via INTRAMUSCULAR
  Filled 2013-09-19: qty 1

## 2013-09-19 MED ORDER — DIPHENHYDRAMINE HCL 50 MG/ML IJ SOLN
25.0000 mg | Freq: Once | INTRAMUSCULAR | Status: AC
  Administered 2013-09-19: 25 mg via INTRAMUSCULAR
  Filled 2013-09-19: qty 1

## 2013-09-19 MED ORDER — IBUPROFEN 600 MG PO TABS: 600.0000 mg | ORAL_TABLET | Freq: Four times a day (QID) | ORAL | Status: DC | PRN

## 2013-09-19 MED ORDER — CYCLOBENZAPRINE HCL 5 MG PO TABS: 5.0000 mg | ORAL_TABLET | Freq: Three times a day (TID) | ORAL | Status: DC | PRN

## 2013-09-19 MED ORDER — METOCLOPRAMIDE HCL 5 MG/ML IJ SOLN
10.0000 mg | Freq: Once | INTRAMUSCULAR | Status: AC
  Administered 2013-09-19: 10 mg via INTRAMUSCULAR
  Filled 2013-09-19: qty 2

## 2013-09-19 MED ORDER — KETOROLAC TROMETHAMINE 60 MG/2ML IM SOLN
60.0000 mg | Freq: Once | INTRAMUSCULAR | Status: AC
  Administered 2013-09-19: 60 mg via INTRAMUSCULAR
  Filled 2013-09-19: qty 2

## 2013-09-19 NOTE — ED Notes (Signed)
Pt's boyfriend showing increased agitation, constantly demanding pain medication at nurses station for pt. PA Almyra Free and security notified.

## 2013-09-19 NOTE — ED Notes (Signed)
PA at bedside.

## 2013-09-19 NOTE — ED Notes (Signed)
Patient transported to X-ray 

## 2013-09-19 NOTE — ED Provider Notes (Signed)
Medical screening examination/treatment/procedure(s) were conducted as a shared visit with non-physician practitioner(s) and myself.  I personally evaluated the patient during the encounter.  Pt with hx chronic/recurrent abd pain, c/o epigastric/mid abd pain. No fevers. abd soft nt. No vomiting. Labs.    Mirna Mires, MD 09/19/13 1556

## 2013-09-19 NOTE — ED Notes (Signed)
Pt reported " i cant wait much longer." pt reports "if the doctor takes too long I will leave." pt tearful and was informed that waiting on PA for next step in careplan. nad noted. Pt ambulated to bathroom with steady gait at the end of conversation.

## 2013-09-19 NOTE — ED Notes (Signed)
Significant other driving pt home.

## 2013-09-19 NOTE — ED Notes (Signed)
Pt states she was here on the 9th and fell in waiting area behind triage. States there was a 6 hour wait and did not stay to be seen. Pt states she was initially here that day for ovarian pain. Pt states she was seen at [facility] ED the next day. Pt states migraine, and pain to left wrist, left elbow, shoulder and left hip also. Marland Kitchen

## 2013-09-19 NOTE — Discharge Instructions (Signed)
Arthralgia °Your caregiver has diagnosed you as suffering from an arthralgia. Arthralgia means there is pain in a joint. This can come from many reasons including: °· Bruising the joint which causes soreness (inflammation) in the joint. °· Wear and tear on the joints which occur as we grow older (osteoarthritis). °· Overusing the joint. °· Various forms of arthritis. °· Infections of the joint. °Regardless of the cause of pain in your joint, most of these different pains respond to anti-inflammatory drugs and rest. The exception to this is when a joint is infected, and these cases are treated with antibiotics, if it is a bacterial infection. °HOME CARE INSTRUCTIONS  °· Rest the injured area for as long as directed by your caregiver. Then slowly start using the joint as directed by your caregiver and as the pain allows. Crutches as directed may be useful if the ankles, knees or hips are involved. If the knee was splinted or casted, continue use and care as directed. If an stretchy or elastic wrapping bandage has been applied today, it should be removed and re-applied every 3 to 4 hours. It should not be applied tightly, but firmly enough to keep swelling down. Watch toes and feet for swelling, bluish discoloration, coldness, numbness or excessive pain. If any of these problems (symptoms) occur, remove the ace bandage and re-apply more loosely. If these symptoms persist, contact your caregiver or return to this location. °· For the first 24 hours, keep the injured extremity elevated on pillows while lying down. °· Apply ice for 15-20 minutes to the sore joint every couple hours while awake for the first half day. Then 03-04 times per day for the first 48 hours. Put the ice in a plastic bag and place a towel between the bag of ice and your skin. °· Wear any splinting, casting, elastic bandage applications, or slings as instructed. °· Only take over-the-counter or prescription medicines for pain, discomfort, or fever as  directed by your caregiver. Do not use aspirin immediately after the injury unless instructed by your physician. Aspirin can cause increased bleeding and bruising of the tissues. °· If you were given crutches, continue to use them as instructed and do not resume weight bearing on the sore joint until instructed. °Persistent pain and inability to use the sore joint as directed for more than 2 to 3 days are warning signs indicating that you should see a caregiver for a follow-up visit as soon as possible. Initially, a hairline fracture (break in bone) may not be evident on X-rays. Persistent pain and swelling indicate that further evaluation, non-weight bearing or use of the joint (use of crutches or slings as instructed), or further X-rays are indicated. X-rays may sometimes not show a small fracture until a week or 10 days later. Make a follow-up appointment with your own caregiver or one to whom we have referred you. A radiologist (specialist in reading X-rays) may read your X-rays. Make sure you know how you are to obtain your X-ray results. Do not assume everything is normal if you do not hear from us. °SEEK MEDICAL CARE IF: °Bruising, swelling, or pain increases. °SEEK IMMEDIATE MEDICAL CARE IF:  °· Your fingers or toes are numb or blue. °· The pain is not responding to medications and continues to stay the same or get worse. °· The pain in your joint becomes severe. °· You develop a fever over 102° F (38.9° C). °· It becomes impossible to move or use the joint. °MAKE SURE YOU:  °·   Understand these instructions.  Will watch your condition.  Will get help right away if you are not doing well or get worse. Document Released: 12/24/2004 Document Revised: 03/18/2011 Document Reviewed: 08/12/2007 [facility] Patient Information 2015 [place], [place]. This information is not intended to replace advice given to you by your health care provider. Make sure you discuss any questions you have with your health care  provider.   Your xrays are normal today with no sign of injury from your fall on the 9th.  Use the medicines prescribed for your pain and muscle spasm.  Apply a heating pad for 20 minutes 3 times daily.  See your doctor as soon as possible this week for a recheck of your symptoms, including your flank pain for which we saw you yesterday and the day before.  Remember that you will need repeat blood work when you see your doctor to track your lipase level which should return to normal.

## 2013-09-19 NOTE — ED Notes (Signed)
Pt's boyfriend asking about pain medications for pt's migraine. PA Almyra Free made aware, PA stated she was waiting for xray results prior to addressing pt pain. Pt and pt boyfriend aware and verbalized understanding.

## 2013-09-19 NOTE — ED Notes (Signed)
Pt boyfriend out to nurses station inquiring about pt pain. Pt boyfriend made aware that two xray results are still pending and provider will update pt of careplan after all results are back. Pt boyfriend agitated but remains at bedside.

## 2013-09-20 ENCOUNTER — Emergency Department (HOSPITAL_COMMUNITY)
Admission: EM | Admit: 2013-09-20 | Discharge: 2013-09-20 | Disposition: A | Discharge status: Home / Self Care | Payer: Medicare Other | Attending: Emergency Medicine | Admitting: Emergency Medicine

## 2013-09-20 ENCOUNTER — Encounter (HOSPITAL_COMMUNITY): Payer: Self-pay | Admitting: Emergency Medicine

## 2013-09-20 DIAGNOSIS — R519 Headache, unspecified: Secondary | ICD-10-CM

## 2013-09-20 DIAGNOSIS — G8929 Other chronic pain: Secondary | ICD-10-CM | POA: Insufficient documentation

## 2013-09-20 DIAGNOSIS — Z8719 Personal history of other diseases of the digestive system: Secondary | ICD-10-CM | POA: Insufficient documentation

## 2013-09-20 DIAGNOSIS — J45909 Unspecified asthma, uncomplicated: Secondary | ICD-10-CM | POA: Diagnosis not present

## 2013-09-20 DIAGNOSIS — F329 Major depressive disorder, single episode, unspecified: Secondary | ICD-10-CM | POA: Diagnosis not present

## 2013-09-20 DIAGNOSIS — Z8742 Personal history of other diseases of the female genital tract: Secondary | ICD-10-CM | POA: Insufficient documentation

## 2013-09-20 DIAGNOSIS — G43909 Migraine, unspecified, not intractable, without status migrainosus: Secondary | ICD-10-CM | POA: Diagnosis present

## 2013-09-20 DIAGNOSIS — F3289 Other specified depressive episodes: Secondary | ICD-10-CM | POA: Insufficient documentation

## 2013-09-20 DIAGNOSIS — R51 Headache: Secondary | ICD-10-CM

## 2013-09-20 DIAGNOSIS — F411 Generalized anxiety disorder: Secondary | ICD-10-CM | POA: Diagnosis not present

## 2013-09-20 DIAGNOSIS — F17200 Nicotine dependence, unspecified, uncomplicated: Secondary | ICD-10-CM | POA: Insufficient documentation

## 2013-09-20 DIAGNOSIS — Z79899 Other long term (current) drug therapy: Secondary | ICD-10-CM | POA: Diagnosis not present

## 2013-09-20 MED ORDER — DIPHENHYDRAMINE HCL 25 MG PO CAPS
50.0000 mg | ORAL_CAPSULE | Freq: Once | ORAL | Status: AC
  Administered 2013-09-20: 50 mg via ORAL
  Filled 2013-09-20: qty 2

## 2013-09-20 MED ORDER — KETOROLAC TROMETHAMINE 60 MG/2ML IM SOLN
60.0000 mg | Freq: Once | INTRAMUSCULAR | Status: AC
  Administered 2013-09-20: 60 mg via INTRAMUSCULAR
  Filled 2013-09-20: qty 2

## 2013-09-20 MED ORDER — METOCLOPRAMIDE HCL 10 MG PO TABS
10.0000 mg | ORAL_TABLET | Freq: Once | ORAL | Status: AC
  Administered 2013-09-20: 10 mg via ORAL
  Filled 2013-09-20: qty 1

## 2013-09-20 MED ORDER — DEXAMETHASONE SODIUM PHOSPHATE 10 MG/ML IJ SOLN
10.0000 mg | Freq: Once | INTRAMUSCULAR | Status: AC
  Administered 2013-09-20: 10 mg via INTRAMUSCULAR
  Filled 2013-09-20: qty 1

## 2013-09-20 NOTE — ED Provider Notes (Signed)
CSN: [id number]     Arrival date & time 09/19/13  0915 History   First MD Initiated Contact with Patient 09/19/13 0935     Chief Complaint  Patient presents with  . Migraine     (Consider location/radiation/quality/duration/timing/severity/associated sxs/prior Treatment) The history is provided by the patient and the spouse.   Emily [name] is a 33 y.o. female here again after being seen by me yesterday for compaint of left flank pain (better today, but not gone) now here for persistent migraine headache but also with complaint of pain in her left shoulder, elbow and hip from injuries sustained when she fell in our waiting room on 9/9 when she was waiting to be evaluated for her flank pain.  She has had such a severe migraine and flank pain since her fall, her other pain overshadowed her orthopedic injuries but she is very concerned she may have a bony injury given her pain is not improving at these sites.  Additionally, she has chronic migraine which is sequalae of her MS, per her report and will have intermittent episodes of intractable migraine lasting a week or more, then will settle down for awhile.  She believes this migraine was triggered by the NG tube that was inserted during her admission last week at [facility] at which time she possibly had an obstruction, which resolved.  She denies fevers or chills and denies focal weakness.  Her shoulder and elbow pain are worsened with movement and palpation.  She is right handed.    Past Medical History  Diagnosis Date  . Headache(784.0)     migraines  . Multiple sclerosis   . Asthma as a child  . GERD (gastroesophageal reflux disease)   . Urinary urgency   . Anxiety   . Perforated bowel 2009  . Migraines   . Depression   . Chronic back pain   . Chronic pain   . Ovarian cyst    Past Surgical History  Procedure Laterality Date  . Extremity cyst excision  1994    right leg  . 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 Colostomy  closure  04/2007  . Scar revision  01/21/2011    Procedure: SCAR REVISION;  Surgeon: Hermelinda Dellen;  Location: [place];  Service: Plastics;  Laterality: N/A;  exploration of scar of abdomen and repair of defect  . Hernia repair    . Abdominal surgery    . Appendectomy     No family history on file. History  Substance Use Topics  . Smoking status: Current Some Day Smoker    Types: Cigarettes  . Smokeless tobacco: Never Used     Comment: quit smoking 10 days ago  . Alcohol Use: Yes     Comment: Rare   OB History   Grav Para Term Preterm Abortions TAB SAB Ect Mult Living                 Review of Systems  Constitutional: Negative for fever and chills.  Eyes: Negative for photophobia and visual disturbance.  Musculoskeletal: Positive for arthralgias. Negative for joint swelling and myalgias.  Neurological: Positive for headaches. Negative for speech difficulty, weakness, light-headedness and numbness.      Allergies  Amitriptyline; Baclofen; Cymbalta; Gabapentin; Monosodium glutamate; Other; Vicodin; Alprazolam; Magnesium salicylate; Rizatriptan; Tizanidine; Adhesive; and Lamotrigine  Home Medications   Prior to Admission medications   Medication Sig Start Date End Date Taking? Authorizing Provider  carbamazepine (CARBATROL) 200  MG 12 hr capsule Take 200 mg by mouth 3 (three) times daily. 04/22/13 04/22/14 Yes Historical Provider, MD  clonazePAM (KLONOPIN) 1 MG tablet Take 1 mg by mouth 2 (two) times daily as needed for anxiety.    Yes Historical Provider, MD  divalproex (DEPAKOTE ER) 500 MG 24 hr tablet Take 500 mg by mouth every morning. For depression   Yes Historical Provider, MD  EPINEPHrine (EPIPEN) 0.3 mg/0.3 mL SOAJ injection Inject 0.3 mg into the muscle once.   Yes Historical Provider, MD  Fingolimod HCl (GILENYA) 0.5 MG CAPS Take 1 capsule by mouth every morning.    Yes Historical Provider, MD  levonorgestrel (MIRENA) 20 MCG/24HR IUD 1 each by  Intrauterine route once.   Yes Historical Provider, MD  loratadine (CLARITIN) 10 MG tablet Take 10 mg by mouth every morning.    Yes Historical Provider, MD  metoCLOPramide (REGLAN) 10 MG tablet Take 1 tablet (10 mg total) by mouth every 6 (six) hours as needed for nausea (or headache). 09/11/61  Yes Delora Fuel, MD  morphine (MSIR) 15 MG tablet Take 15 mg by mouth 4 (four) times daily.   Yes Historical Provider, MD  omeprazole (PRILOSEC) 40 MG capsule Take 40 mg by mouth every morning.    Yes Historical Provider, MD  ondansetron (ZOFRAN) 8 MG tablet Take 1 tablet (8 mg total) by mouth every 4 (four) hours as needed. 08/14/13  Yes Nat Christen, MD  potassium chloride (K-DUR) 10 MEQ tablet Take 10 mEq by mouth 2 (two) times daily.   Yes Historical Provider, MD  pregabalin (LYRICA) 100 MG capsule Take 300 mg by mouth at bedtime.    Yes Historical Provider, MD  promethazine (PHENERGAN) 25 MG tablet Take 1 tablet (25 mg total) by mouth every 6 (six) hours as needed. 08/17/13  Yes Nat Christen, MD  propranolol (INDERAL) 10 MG tablet Take 10 mg by mouth 2 (two) times daily.   Yes Historical Provider, MD  ranitidine (ZANTAC) 300 MG capsule Take 300 mg by mouth every evening.     Yes Historical Provider, MD  topiramate (TOPAMAX) 100 MG tablet Take 100 mg by mouth 2 (two) times daily.    Yes Historical Provider, MD  cyclobenzaprine (FLEXERIL) 5 MG tablet Take 1 tablet (5 mg total) by mouth 3 (three) times daily as needed for muscle spasms. 09/19/13   Evalee Jefferson, PA-C  ibuprofen (ADVIL,MOTRIN) 600 MG tablet Take 1 tablet (600 mg total) by mouth every 6 (six) hours as needed. 09/19/13   Evalee Jefferson, PA-C   BP 152/93  Pulse 93  Temp(Src) 98.7 F (37.1 C) (Oral)  Resp 16  Ht 5\' 5"  (1.651 m)  Wt 200 lb (90.719 kg)  BMI 33.28 kg/m2  SpO2 100% Physical Exam  Nursing note and vitals reviewed. Constitutional: She is oriented to person, place, and time. She appears well-developed and well-nourished.  Tearful, but less  so than during yesterdays visit.  HENT:  Head: Normocephalic and atraumatic.  Mouth/Throat: Oropharynx is clear and moist.  Eyes: EOM are normal. Pupils are equal, round, and reactive to light.  Neck: Normal range of motion. Neck supple.  Cardiovascular: Normal rate and normal heart sounds.   Pulmonary/Chest: Effort normal.  Abdominal: Soft. There is no tenderness.  Musculoskeletal: Normal range of motion. She exhibits tenderness. She exhibits no edema.       Left shoulder: She exhibits bony tenderness and pain. She exhibits normal range of motion, no tenderness, no swelling, no effusion, no deformity, no laceration  and normal pulse.       Left elbow: She exhibits normal range of motion, no swelling, no effusion, no deformity and no laceration. Tenderness found. Olecranon process tenderness noted.       Left hip: She exhibits bony tenderness.  ttp along left anterior pelvic rim.  No visible edema, ecchymosis or other sign of trauma.  Lymphadenopathy:    She has no cervical adenopathy.  Neurological: She is alert and oriented to person, place, and time. She has normal strength. No sensory deficit. Gait normal. GCS eye subscore is 4. GCS verbal subscore is 5. GCS motor subscore is 6.  Normal heel-shin, normal rapid alternating movements. Cranial nerves III-XII intact.  No pronator drift.  Skin: Skin is warm and dry. No rash noted.  Psychiatric: She has a normal mood and affect. Her speech is normal and behavior is normal. Thought content normal. Cognition and memory are normal.    ED Course  Procedures (including critical care time) Labs Review Labs Reviewed - No data to display  Imaging Review Dg Pelvis 1-2 Views  09/19/2013   CLINICAL DATA:  Fall.  Left hip and pelvic pain  EXAM: PELVIS - 1-2 VIEW  COMPARISON:  09/17/2013  FINDINGS: There is no evidence of pelvic fracture or diastasis. No pelvic bone lesions are seen.  IMPRESSION: Negative.   Electronically Signed   By: Kerby Moors  M.D.   On: 09/19/2013 11:46   Dg Elbow Complete Left  09/19/2013   CLINICAL DATA:  Fall.  Elbow pain.  EXAM: LEFT ELBOW - COMPLETE 3+ VIEW  COMPARISON:  None.  FINDINGS: There is no evidence of fracture, dislocation, or joint effusion. There is no evidence of arthropathy or other focal bone abnormality. Soft tissues are unremarkable.  IMPRESSION: Negative.   Electronically Signed   By: Kerby Moors M.D.   On: 09/19/2013 11:43   Dg Shoulder Left  09/19/2013   CLINICAL DATA:  [place] [address].  Left shoulder pain.  EXAM: LEFT SHOULDER - 2+ VIEW  COMPARISON:  05/31/2009  FINDINGS: There is no evidence of fracture or dislocation. There is no evidence of arthropathy. Well-defined 7 mm sclerotic lesion in the left humeral head is most likely a benign bone island dominant stable compared to radiographs of 2011. Soft tissues are unremarkable.  IMPRESSION: No acute bony abnormality.   Electronically Signed   By: Curlene Dolphin M.D.   On: 09/19/2013 11:47     EKG Interpretation None      MDM   Final diagnoses:  Shoulder pain, acute, left  Elbow pain, left  Fall, initial encounter    Discussed likelihood of bony injury given mechanism of injury and lack of trauma findings on exam.  Pt states she is convinced there is a bad injury and is insistent on xrays.  Discussed the amount of radiation she has had in the past 2 weeks including 2 head Ct scans, 2 abd/pelvic Ct along along with acute abd plain films.  Pt was not concerned about this - believes she needs these tests. Given her frequency of visits here this week,  xrays were ordered in the hopes of preventing further ed visits for this complaint.   She was given toradol 60 mg IM without headache or orthopedic pain relief.  Given migraine cocktail including decadron 10 mg, benadryl 25 mg, reglan 10 mg and dilaudid 1 mg, all IM injections, which she states has helped relieve her headache in the past, although sometimes only for 12 -24 hour increments until  this  headache cycle passes.  Pt was encouraged to f/u with her headache/MS specialist in [facility].  Pt agrees.     The patient appears reasonably screened and/or stabilized for discharge and I doubt any other medical condition or other [facility] requiring further screening, evaluation, or treatment in the ED at this time prior to discharge.    Evalee Jefferson, PA-C 09/20/13 1731

## 2013-09-20 NOTE — ED Provider Notes (Signed)
CSN: [id number]     Arrival date & time 09/20/13  0534 History   First MD Initiated Contact with Patient 09/20/13 0550     Chief Complaint  Patient presents with  . Migraine     (Consider location/radiation/quality/duration/timing/severity/associated sxs/prior Treatment) HPI.... migraine headache for the past several hours. Patient has had multiple ER visits in the recent past. No fever, chills, stiff neck. Severity is moderate. Nothing makes symptoms better or worse. She was treated yesterday in [place] for same. She claims to have multiple sclerosis  Past Medical History  Diagnosis Date  . Headache(784.0)     migraines  . Multiple sclerosis   . Asthma as a child  . GERD (gastroesophageal reflux disease)   . Urinary urgency   . Anxiety   . Perforated bowel 2009  . Migraines   . Depression   . Chronic back pain   . Chronic pain   . Ovarian cyst    Past Surgical History  Procedure Laterality Date  . Extremity cyst excision  1994    right leg  . 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 Colostomy closure  04/2007  . Scar revision  01/21/2011    Procedure: SCAR REVISION;  Surgeon: Hermelinda Dellen;  Location: [place];  Service: Plastics;  Laterality: N/A;  exploration of scar of abdomen and repair of defect  . Hernia repair    . Abdominal surgery    . Appendectomy     History reviewed. No pertinent family history. History  Substance Use Topics  . Smoking status: Current Some Day Smoker    Types: Cigarettes  . Smokeless tobacco: Never Used     Comment: quit smoking 10 days ago  . Alcohol Use: Yes     Comment: Rare   OB History   Grav Para Term Preterm Abortions TAB SAB Ect Mult Living                 Review of Systems  All other systems reviewed and are negative.     Allergies  Amitriptyline; Baclofen; Cymbalta; Gabapentin; Monosodium glutamate; Other; Vicodin; Alprazolam; Magnesium salicylate; Rizatriptan; Tizanidine; Adhesive; and  Lamotrigine  Home Medications   Prior to Admission medications   Medication Sig Start Date End Date Taking? Authorizing Provider  carbamazepine (CARBATROL) 200 MG 12 hr capsule Take 200 mg by mouth 3 (three) times daily. 04/22/13 04/22/14  Historical Provider, MD  clonazePAM (KLONOPIN) 1 MG tablet Take 1 mg by mouth 2 (two) times daily as needed for anxiety.     Historical Provider, MD  cyclobenzaprine (FLEXERIL) 5 MG tablet Take 1 tablet (5 mg total) by mouth 3 (three) times daily as needed for muscle spasms. 09/19/13   Evalee Jefferson, PA-C  divalproex (DEPAKOTE ER) 500 MG 24 hr tablet Take 500 mg by mouth every morning. For depression    Historical Provider, MD  EPINEPHrine (EPIPEN) 0.3 mg/0.3 mL SOAJ injection Inject 0.3 mg into the muscle once.    Historical Provider, MD  Fingolimod HCl (GILENYA) 0.5 MG CAPS Take 1 capsule by mouth every morning.     Historical Provider, MD  ibuprofen (ADVIL,MOTRIN) 600 MG tablet Take 1 tablet (600 mg total) by mouth every 6 (six) hours as needed. 09/19/13   Evalee Jefferson, PA-C  levonorgestrel (MIRENA) 20 MCG/24HR IUD 1 each by Intrauterine route once.    Historical Provider, MD  loratadine (CLARITIN) 10 MG tablet Take 10 mg by mouth every morning.  Historical Provider, MD  metoCLOPramide (REGLAN) 10 MG tablet Take 1 tablet (10 mg total) by mouth every 6 (six) hours as needed for nausea (or headache). 08/15/39   Delora Fuel, MD  morphine (MSIR) 15 MG tablet Take 15 mg by mouth 4 (four) times daily.    Historical Provider, MD  omeprazole (PRILOSEC) 40 MG capsule Take 40 mg by mouth every morning.     Historical Provider, MD  ondansetron (ZOFRAN) 8 MG tablet Take 1 tablet (8 mg total) by mouth every 4 (four) hours as needed. 08/14/13   Nat Christen, MD  potassium chloride (K-DUR) 10 MEQ tablet Take 10 mEq by mouth 2 (two) times daily.    Historical Provider, MD  pregabalin (LYRICA) 100 MG capsule Take 300 mg by mouth at bedtime.     Historical Provider, MD  promethazine  (PHENERGAN) 25 MG tablet Take 1 tablet (25 mg total) by mouth every 6 (six) hours as needed. 08/17/13   Nat Christen, MD  propranolol (INDERAL) 10 MG tablet Take 10 mg by mouth 2 (two) times daily.    Historical Provider, MD  ranitidine (ZANTAC) 300 MG capsule Take 300 mg by mouth every evening.      Historical Provider, MD  topiramate (TOPAMAX) 100 MG tablet Take 100 mg by mouth 2 (two) times daily.     Historical Provider, MD   BP 145/105  Pulse 87  Temp(Src) 98.4 F (36.9 C) (Oral)  Resp 16  Ht 5\' 5"  (1.651 m)  Wt 200 lb (90.719 kg)  BMI 33.28 kg/m2  SpO2 100% Physical Exam  Nursing note and vitals reviewed. Constitutional: She is oriented to person, place, and time. She appears well-developed and well-nourished.  HENT:  Head: Normocephalic and atraumatic.  Eyes: Conjunctivae and EOM are normal. Pupils are equal, round, and reactive to light.  Neck: Normal range of motion. Neck supple.  Cardiovascular: Normal rate, regular rhythm and normal heart sounds.   Pulmonary/Chest: Effort normal and breath sounds normal.  Abdominal: Soft. Bowel sounds are normal.  Musculoskeletal: Normal range of motion.  Neurological: She is alert and oriented to person, place, and time.  Skin: Skin is warm and dry.  Psychiatric: She has a normal mood and affect. Her behavior is normal.    ED Course  Procedures (including critical care time) Labs Review Labs Reviewed - No data to display  Imaging Review Dg Pelvis 1-2 Views  09/19/2013   CLINICAL DATA:  Fall.  Left hip and pelvic pain  EXAM: PELVIS - 1-2 VIEW  COMPARISON:  09/17/2013  FINDINGS: There is no evidence of pelvic fracture or diastasis. No pelvic bone lesions are seen.  IMPRESSION: Negative.   Electronically Signed   By: Kerby Moors M.D.   On: 09/19/2013 11:46   Dg Elbow Complete Left  09/19/2013   CLINICAL DATA:  Fall.  Elbow pain.  EXAM: LEFT ELBOW - COMPLETE 3+ VIEW  COMPARISON:  None.  FINDINGS: There is no evidence of fracture,  dislocation, or joint effusion. There is no evidence of arthropathy or other focal bone abnormality. Soft tissues are unremarkable.  IMPRESSION: Negative.   Electronically Signed   By: Kerby Moors M.D.   On: 09/19/2013 11:43   Dg Shoulder Left  09/19/2013   CLINICAL DATA:  [place].  Left shoulder pain.  EXAM: LEFT SHOULDER - 2+ VIEW  COMPARISON:  05/31/2009  FINDINGS: There is no evidence of fracture or dislocation. There is no evidence of arthropathy. Well-defined 7 mm sclerotic lesion in the left  humeral head is most likely a benign bone island dominant stable compared to radiographs of 2011. Soft tissues are unremarkable.  IMPRESSION: No acute bony abnormality.   Electronically Signed   By: Curlene Dolphin M.D.   On: 09/19/2013 11:47     EKG Interpretation None      MDM   Final diagnoses:  Nonintractable headache, unspecified chronicity pattern, unspecified headache type    Patient has had 14 ER visits since 08/14/13.  We discussed this in great detail.   Rx IM Toradol, IM Decadron, by mouth Benadryl, by mouth Reglan. Followup with her neurologist    Nat Christen, MD 09/20/13 [phone]

## 2013-09-20 NOTE — Discharge Instructions (Signed)
You must get primary care or neurology followup for your pain issues.

## 2013-09-21 NOTE — ED Provider Notes (Signed)
Medical screening examination/treatment/procedure(s) were performed by non-physician practitioner and as supervising physician I was immediately available for consultation/collaboration.   EKG Interpretation None        Fredia Sorrow, MD 09/21/13 1649

## 2013-09-22 NOTE — ED Provider Notes (Signed)
Medical screening examination/treatment/procedure(s) were performed by non-physician practitioner and as supervising physician I was immediately available for consultation/collaboration.   EKG Interpretation None       [name], MD 09/22/13 1235

## 2013-09-28 ENCOUNTER — Encounter (HOSPITAL_COMMUNITY): Payer: Self-pay | Admitting: Emergency Medicine

## 2013-09-28 ENCOUNTER — Emergency Department (HOSPITAL_COMMUNITY)
Admission: EM | Admit: 2013-09-28 | Discharge: 2013-09-28 | Disposition: A | Discharge status: Home / Self Care | Payer: Medicare Other | Attending: Emergency Medicine | Admitting: Emergency Medicine

## 2013-09-28 DIAGNOSIS — J45909 Unspecified asthma, uncomplicated: Secondary | ICD-10-CM | POA: Insufficient documentation

## 2013-09-28 DIAGNOSIS — Z8742 Personal history of other diseases of the female genital tract: Secondary | ICD-10-CM | POA: Insufficient documentation

## 2013-09-28 DIAGNOSIS — F329 Major depressive disorder, single episode, unspecified: Secondary | ICD-10-CM | POA: Diagnosis not present

## 2013-09-28 DIAGNOSIS — T2220XA Burn of second degree of shoulder and upper limb, except wrist and hand, unspecified site, initial encounter: Secondary | ICD-10-CM | POA: Diagnosis present

## 2013-09-28 DIAGNOSIS — G43909 Migraine, unspecified, not intractable, without status migrainosus: Secondary | ICD-10-CM | POA: Insufficient documentation

## 2013-09-28 DIAGNOSIS — G8929 Other chronic pain: Secondary | ICD-10-CM | POA: Diagnosis not present

## 2013-09-28 DIAGNOSIS — Z79899 Other long term (current) drug therapy: Secondary | ICD-10-CM | POA: Diagnosis not present

## 2013-09-28 DIAGNOSIS — F17200 Nicotine dependence, unspecified, uncomplicated: Secondary | ICD-10-CM | POA: Insufficient documentation

## 2013-09-28 DIAGNOSIS — F3289 Other specified depressive episodes: Secondary | ICD-10-CM | POA: Diagnosis not present

## 2013-09-28 DIAGNOSIS — M549 Dorsalgia, unspecified: Secondary | ICD-10-CM | POA: Insufficient documentation

## 2013-09-28 DIAGNOSIS — X088XXA Exposure to other specified smoke, fire and flames, initial encounter: Secondary | ICD-10-CM | POA: Diagnosis not present

## 2013-09-28 DIAGNOSIS — Y9289 Other specified places as the place of occurrence of the external cause: Secondary | ICD-10-CM | POA: Diagnosis not present

## 2013-09-28 DIAGNOSIS — R5381 Other malaise: Secondary | ICD-10-CM | POA: Insufficient documentation

## 2013-09-28 DIAGNOSIS — T2220XD Burn of second degree of shoulder and upper limb, except wrist and hand, unspecified site, subsequent encounter: Secondary | ICD-10-CM

## 2013-09-28 DIAGNOSIS — F411 Generalized anxiety disorder: Secondary | ICD-10-CM | POA: Diagnosis not present

## 2013-09-28 DIAGNOSIS — K219 Gastro-esophageal reflux disease without esophagitis: Secondary | ICD-10-CM | POA: Diagnosis not present

## 2013-09-28 DIAGNOSIS — Y9389 Activity, other specified: Secondary | ICD-10-CM | POA: Diagnosis not present

## 2013-09-28 DIAGNOSIS — R5383 Other fatigue: Secondary | ICD-10-CM | POA: Diagnosis not present

## 2013-09-28 MED ORDER — ONDANSETRON 4 MG PO TBDP
4.0000 mg | ORAL_TABLET | Freq: Once | ORAL | Status: AC
  Administered 2013-09-28: 4 mg via ORAL
  Filled 2013-09-28: qty 1

## 2013-09-28 MED ORDER — ACETAMINOPHEN-CODEINE #3 300-30 MG PO TABS
2.0000 | ORAL_TABLET | Freq: Once | ORAL | Status: AC
  Administered 2013-09-28: 2 via ORAL
  Filled 2013-09-28: qty 2

## 2013-09-28 MED ORDER — ACETAMINOPHEN-CODEINE #3 300-30 MG PO TABS: 2.0000 | ORAL_TABLET | Freq: Once | ORAL | Status: DC

## 2013-09-28 MED ORDER — CLINDAMYCIN HCL 150 MG PO CAPS
300.0000 mg | ORAL_CAPSULE | Freq: Once | ORAL | Status: AC
  Administered 2013-09-28: 300 mg via ORAL
  Filled 2013-09-28: qty 2

## 2013-09-28 MED ORDER — DOXYCYCLINE HYCLATE 100 MG PO TABS: 100.0000 mg | ORAL_TABLET | Freq: Once | ORAL | Status: DC

## 2013-09-28 MED ORDER — BACITRACIN-NEOMYCIN-POLYMYXIN 400-5-5000 EX OINT
TOPICAL_OINTMENT | Freq: Once | CUTANEOUS | Status: AC
  Administered 2013-09-28: 1 via TOPICAL
  Filled 2013-09-28: qty 1

## 2013-09-28 MED ORDER — ACETAMINOPHEN-CODEINE #3 300-30 MG PO TABS: ORAL_TABLET | ORAL | Status: DC

## 2013-09-28 MED ORDER — HYDROXYZINE HCL 25 MG PO TABS: 25.0000 mg | ORAL_TABLET | Freq: Once | ORAL | Status: DC

## 2013-09-28 MED ORDER — KETOROLAC TROMETHAMINE 10 MG PO TABS: 10.0000 mg | ORAL_TABLET | Freq: Once | ORAL | Status: DC

## 2013-09-28 MED ORDER — CLINDAMYCIN HCL 150 MG PO CAPS: ORAL_CAPSULE | ORAL | Status: DC
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
## 2013-10-01 ENCOUNTER — Emergency Department (HOSPITAL_COMMUNITY)
Admission: EM | Admit: 2013-10-01 | Discharge: 2013-10-01 | Discharge status: Against Medical Advice | Payer: Medicare Other | Attending: Emergency Medicine | Admitting: Emergency Medicine

## 2013-10-01 ENCOUNTER — Emergency Department (HOSPITAL_COMMUNITY): Payer: Medicare Other

## 2013-10-01 ENCOUNTER — Encounter (HOSPITAL_COMMUNITY): Payer: Self-pay | Admitting: Emergency Medicine

## 2013-10-01 DIAGNOSIS — Z791 Long term (current) use of non-steroidal anti-inflammatories (NSAID): Secondary | ICD-10-CM | POA: Insufficient documentation

## 2013-10-01 DIAGNOSIS — K219 Gastro-esophageal reflux disease without esophagitis: Secondary | ICD-10-CM | POA: Insufficient documentation

## 2013-10-01 DIAGNOSIS — F3289 Other specified depressive episodes: Secondary | ICD-10-CM | POA: Diagnosis not present

## 2013-10-01 DIAGNOSIS — Z8739 Personal history of other diseases of the musculoskeletal system and connective tissue: Secondary | ICD-10-CM | POA: Diagnosis not present

## 2013-10-01 DIAGNOSIS — R51 Headache: Secondary | ICD-10-CM

## 2013-10-01 DIAGNOSIS — H539 Unspecified visual disturbance: Secondary | ICD-10-CM

## 2013-10-01 DIAGNOSIS — G8929 Other chronic pain: Secondary | ICD-10-CM | POA: Insufficient documentation

## 2013-10-01 DIAGNOSIS — R519 Headache, unspecified: Secondary | ICD-10-CM

## 2013-10-01 DIAGNOSIS — F411 Generalized anxiety disorder: Secondary | ICD-10-CM | POA: Insufficient documentation

## 2013-10-01 DIAGNOSIS — F17200 Nicotine dependence, unspecified, uncomplicated: Secondary | ICD-10-CM | POA: Insufficient documentation

## 2013-10-01 DIAGNOSIS — Z792 Long term (current) use of antibiotics: Secondary | ICD-10-CM | POA: Diagnosis not present

## 2013-10-01 DIAGNOSIS — Z79899 Other long term (current) drug therapy: Secondary | ICD-10-CM | POA: Diagnosis not present

## 2013-10-01 DIAGNOSIS — H538 Other visual disturbances: Secondary | ICD-10-CM | POA: Insufficient documentation

## 2013-10-01 DIAGNOSIS — J45909 Unspecified asthma, uncomplicated: Secondary | ICD-10-CM | POA: Insufficient documentation

## 2013-10-01 DIAGNOSIS — G35 Multiple sclerosis: Secondary | ICD-10-CM | POA: Insufficient documentation

## 2013-10-01 DIAGNOSIS — G43909 Migraine, unspecified, not intractable, without status migrainosus: Secondary | ICD-10-CM | POA: Diagnosis present

## 2013-10-01 DIAGNOSIS — F329 Major depressive disorder, single episode, unspecified: Secondary | ICD-10-CM | POA: Insufficient documentation

## 2013-10-01 LAB — CBC WITH DIFFERENTIAL/PLATELET
BASOS ABS: 0 10*3/uL (ref 0.0–0.1)
Basophils Relative: 0 % (ref 0–1)
EOS PCT: 1 % (ref 0–5)
Eosinophils Absolute: 0.1 10*3/uL (ref 0.0–0.7)
HEMATOCRIT: 40.7 % (ref 36.0–46.0)
HEMOGLOBIN: 13.7 g/dL (ref 12.0–15.0)
LYMPHS ABS: 1.7 10*3/uL (ref 0.7–4.0)
LYMPHS PCT: 25 % (ref 12–46)
MCH: 30.4 pg (ref 26.0–34.0)
MCHC: 33.7 g/dL (ref 30.0–36.0)
MCV: 90.4 fL (ref 78.0–100.0)
MONO ABS: 0.6 10*3/uL (ref 0.1–1.0)
Monocytes Relative: 8 % (ref 3–12)
Neutro Abs: 4.5 10*3/uL (ref 1.7–7.7)
Neutrophils Relative %: 66 % (ref 43–77)
Platelets: 320 10*3/uL (ref 150–400)
RBC: 4.5 MIL/uL (ref 3.87–5.11)
RDW: 13.7 % (ref 11.5–15.5)
WBC: 6.9 10*3/uL (ref 4.0–10.5)

## 2013-10-01 LAB — BASIC METABOLIC PANEL
ANION GAP: 10 (ref 5–15)
BUN: 10 mg/dL (ref 6–23)
CHLORIDE: 107 meq/L (ref 96–112)
CO2: 24 meq/L (ref 19–32)
CREATININE: 0.62 mg/dL (ref 0.50–1.10)
Calcium: 8.6 mg/dL (ref 8.4–10.5)
GFR calc Af Amer: >90 mL/min (ref >90)
GFR calc non Af Amer: >90 mL/min (ref >90)
Glucose, Bld: 82 mg/dL (ref 70–99)
Potassium: 4.4 mEq/L (ref 3.7–5.3)
SODIUM: 141 meq/L (ref 137–147)

## 2013-10-01 LAB — CARBAMAZEPINE LEVEL, TOTAL: Carbamazepine Lvl: 1.9 ug/mL — ABNORMAL LOW (ref 4.0–12.0)

## 2013-10-01 LAB — VALPROIC ACID LEVEL

## 2013-10-01 MED ORDER — SODIUM CHLORIDE 0.9 % IV BOLUS (SEPSIS)
1000.0000 mL | Freq: Once | INTRAVENOUS | Status: AC
  Administered 2013-10-01: 1000 mL via INTRAVENOUS

## 2013-10-01 MED ORDER — METOCLOPRAMIDE HCL 5 MG/ML IJ SOLN
10.0000 mg | Freq: Once | INTRAMUSCULAR | Status: AC
  Administered 2013-10-01: 10 mg via INTRAVENOUS
  Filled 2013-10-01: qty 2

## 2013-10-01 MED ORDER — FLUORESCEIN SODIUM 1 MG OP STRP
1.0000 | ORAL_STRIP | Freq: Once | OPHTHALMIC | Status: AC
  Administered 2013-10-01: 1 via OPHTHALMIC
  Filled 2013-10-01: qty 1

## 2013-10-01 MED ORDER — TETRACAINE HCL 0.5 % OP SOLN
2.0000 [drp] | Freq: Once | OPHTHALMIC | Status: AC
  Administered 2013-10-01: 2 [drp] via OPHTHALMIC
  Filled 2013-10-01: qty 2

## 2013-10-01 MED ORDER — DIPHENHYDRAMINE HCL 50 MG/ML IJ SOLN
25.0000 mg | Freq: Once | INTRAMUSCULAR | Status: AC
  Administered 2013-10-01: 25 mg via INTRAVENOUS
  Filled 2013-10-01: qty 1

## 2013-10-01 MED ORDER — ONDANSETRON HCL 4 MG/2ML IJ SOLN
4.0000 mg | Freq: Once | INTRAMUSCULAR | Status: AC
  Administered 2013-10-01: 4 mg via INTRAVENOUS
  Filled 2013-10-01: qty 2
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
## 2013-10-01 NOTE — ED Notes (Signed)
Pt st's no relief from pain meds.  St's dilaudid is the only thing that reduces her pain

## 2013-10-01 NOTE — ED Notes (Signed)
PT ambulated to bathroom independently. Steady gait. Pt reports some light headedness when she first stood up but it went away quickly. Pt returned from bathroom and monitored by pulse ox, bp cuff, and 5-lead.

## 2013-10-01 NOTE — ED Notes (Signed)
Pt c/o migraine headache x's 2 days.  Also c/o pain in left eye.  Nausea without vomiting.  Husband at bedside.  Pt alert and oriented x's 3.

## 2013-10-01 NOTE — ED Notes (Signed)
Pt st's she has to leave, st's she has a family emergency and has to leave now.  AMA form signed by pt.  Pt left ambulatory with steady gait.

## 2013-10-01 NOTE — ED Provider Notes (Signed)
CSN: [id number]     Arrival date & time 10/01/13  1502 History   First MD Initiated Contact with Patient 10/01/13 1557     Chief Complaint  Patient presents with  . Migraine     (Consider location/radiation/quality/duration/timing/severity/associated sxs/prior Treatment) HPI Comments: Patient with history of MS, chronic migraines.  Presenting with 2 day history of L sided headache similar to previous migraines but on the opposite side of head.  Gradual onset, no fever.  Associated with nausea and photophobia.  Has constant R eye pain and pressure but now has L eye pain and blurry vision as well.  Denies visual loss.  No spots, no amaurosis fugax.  Taking morphine at home without relief. No focal weakness, numbness, tingling. Taking gilenya, depakote, topamax, tegretol as well as morphine. Denies any history of normal pressure hydrocephalus.  The history is provided by the patient and a relative.    Past Medical History  Diagnosis Date  . Headache(784.0)     migraines  . Multiple sclerosis   . Asthma as a child  . GERD (gastroesophageal reflux disease)   . Urinary urgency   . Anxiety   . Perforated bowel 2009  . Migraines   . Depression   . Chronic back pain   . Chronic pain   . Ovarian cyst    Past Surgical History  Procedure Laterality Date  . Extremity cyst excision  1994    right leg  . 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 Colostomy closure  04/2007  . Scar revision  01/21/2011    Procedure: SCAR REVISION;  Surgeon: Hermelinda Dellen;  Location: [place];  Service: Plastics;  Laterality: N/A;  exploration of scar of abdomen and repair of defect  . Hernia repair    . Abdominal surgery    . Appendectomy     No family history on file. History  Substance Use Topics  . Smoking status: Current Some Day Smoker    Types: Cigarettes  . Smokeless tobacco: Never Used     Comment: quit smoking 10 days ago  . Alcohol Use: Yes     Comment: Rare   OB  History   Grav Para Term Preterm Abortions TAB SAB Ect Mult Living                 Review of Systems  Constitutional: Negative for fever, activity change and appetite change.  Eyes: Positive for photophobia, pain and visual disturbance.  Respiratory: Negative for cough, chest tightness and shortness of breath.   Cardiovascular: Negative for chest pain.  Gastrointestinal: Positive for nausea. Negative for vomiting and abdominal pain.  Genitourinary: Negative for dysuria, hematuria, vaginal bleeding and vaginal discharge.  Musculoskeletal: Negative for arthralgias, back pain, myalgias, neck pain and neck stiffness.  Neurological: Positive for headaches. Negative for dizziness.  A complete 10 system review of systems was obtained and all systems are negative except as noted in the HPI and PMH.      Allergies  Amitriptyline; Baclofen; Cymbalta; Gabapentin; Monosodium glutamate; Vicodin; Alprazolam; Magnesium salicylate; Rizatriptan; Tizanidine; Adhesive; and Lamotrigine  Home Medications   Prior to Admission medications   Medication Sig Start Date End Date Taking? Authorizing Provider  carbamazepine (CARBATROL) 200 MG 12 hr capsule Take 200 mg by mouth 3 (three) times daily. 04/22/13 04/22/14 Yes Historical Provider, MD  clindamycin (CLEOCIN) 150 MG capsule Take 150 mg by mouth 4 (four) times daily. 7 day course started 09/29/13   Yes  Historical Provider, MD  clonazePAM (KLONOPIN) 1 MG tablet Take 1 mg by mouth 2 (two) times daily as needed for anxiety.    Yes Historical Provider, MD  divalproex (DEPAKOTE ER) 500 MG 24 hr tablet Take 500 mg by mouth daily. For depression   Yes Historical Provider, MD  EPINEPHrine (EPIPEN) 0.3 mg/0.3 mL SOAJ injection Inject 0.3 mg into the muscle as needed (allergic reaction).    Yes Historical Provider, MD  Fingolimod HCl (GILENYA) 0.5 MG CAPS Take 0.5 mg by mouth daily.    Yes Historical Provider, MD  ibuprofen (ADVIL,MOTRIN) 600 MG tablet Take 600 mg by  mouth daily as needed (pain).   Yes Historical Provider, MD  loratadine (CLARITIN) 10 MG tablet Take 10 mg by mouth daily.    Yes Historical Provider, MD  medroxyPROGESTERone (DEPO-PROVERA) 150 MG/ML injection Inject 150 mg into the muscle every 3 (three) months. Last injection mid September   Yes Historical Provider, MD  metoCLOPramide (REGLAN) 10 MG tablet Take 1 tablet (10 mg total) by mouth every 6 (six) hours as needed for nausea (or headache). 09/11/61  Yes Delora Fuel, MD  morphine (MSIR) 15 MG tablet Take 15 mg by mouth 4 (four) times daily.   Yes Historical Provider, MD  neomycin-bacitracin-polymyxin (NEOSPORIN) OINT Apply 1 application topically 4 (four) times daily as needed for wound care (burn treatment).   Yes Historical Provider, MD  omeprazole (PRILOSEC) 40 MG capsule Take 40 mg by mouth daily.    Yes Historical Provider, MD  ondansetron (ZOFRAN) 8 MG tablet Take 8 mg by mouth every 6 (six) hours as needed for nausea or vomiting.    Yes Historical Provider, MD  potassium chloride (K-DUR) 10 MEQ tablet Take 10 mEq by mouth 2 (two) times daily.   Yes Historical Provider, MD  pregabalin (LYRICA) 200 MG capsule Take 600 mg by mouth at bedtime.   Yes Historical Provider, MD  promethazine (PHENERGAN) 25 MG tablet Take 25 mg by mouth every 6 (six) hours as needed for nausea or vomiting.    Yes Historical Provider, MD  propranolol (INDERAL) 10 MG tablet Take 10 mg by mouth 2 (two) times daily.   Yes Historical Provider, MD  ranitidine (ZANTAC) 300 MG capsule Take 300 mg by mouth at bedtime.    Yes Historical Provider, MD  topiramate (TOPAMAX) 100 MG tablet Take 100 mg by mouth 2 (two) times daily.    Yes Historical Provider, MD   BP 146/90  Pulse 81  Temp(Src) 98.3 F (36.8 C) (Oral)  Resp 15  SpO2 100% Physical Exam  Nursing note and vitals reviewed. Constitutional: She is oriented to person, place, and time. She appears well-developed and well-nourished. No distress.  HENT:  Head:  Normocephalic and atraumatic.  Mouth/Throat: Oropharynx is clear and moist. No oropharyngeal exudate.  No temporal artery tenderness  Eyes: Conjunctivae and EOM are normal. Pupils are equal, round, and reactive to light.  IOP 20 bilaterally Optic discs difficult to visualize  Neck: Normal range of motion. Neck supple.  No meningismus.  Cardiovascular: Normal rate, regular rhythm, normal heart sounds and intact distal pulses.   No murmur heard. Pulmonary/Chest: Effort normal and breath sounds normal. No respiratory distress.  Abdominal: Soft. There is no tenderness. There is no rebound and no guarding.  Musculoskeletal: Normal range of motion. She exhibits no edema and no tenderness.  Neurological: She is alert and oriented to person, place, and time. No cranial nerve deficit. She exhibits normal muscle tone. Coordination normal.  No ataxia on finger to nose bilaterally. No pronator drift. 5/5 strength throughout. CN 2-12 intact. Negative Romberg. Equal grip strength. Sensation intact. Gait is normal.  No nystagmus, no visual field cuts.  Skin: Skin is warm.  Psychiatric: She has a normal mood and affect. Her behavior is normal.    ED Course  Procedures (including critical care time) Labs Review Labs Reviewed  VALPROIC ACID LEVEL - Abnormal; Notable for the following:    Valproic Acid Lvl <10.0 (*)    All other components within normal limits  CARBAMAZEPINE LEVEL, TOTAL - Abnormal; Notable for the following:    Carbamazepine Lvl 1.9 (*)    All other components within normal limits  CBC WITH DIFFERENTIAL  BASIC METABOLIC PANEL    Imaging Review Ct Head Wo Contrast  10/01/2013   CLINICAL DATA:  Migraine  EXAM: CT HEAD WITHOUT CONTRAST  TECHNIQUE: Contiguous axial images were obtained from the base of the skull through the vertex without intravenous contrast.  COMPARISON:  09/12/2013  FINDINGS: There is no evidence of mass effect, midline shift or extra-axial fluid collections. There  is no evidence of a space-occupying lesion or intracranial hemorrhage. There is no evidence of a cortical-based area of acute infarction.  The ventricles and sulci are appropriate for the patient's age. The basal cisterns are patent.  Visualized portions of the orbits are unremarkable. The visualized portions of the paranasal sinuses and mastoid air cells are unremarkable.  The osseous structures are unremarkable.  IMPRESSION: Normal CT of the brain without intravenous contrast.   Electronically Signed   By: Kathreen Devoid   On: 10/01/2013 18:26     EKG Interpretation None      MDM   Final diagnoses:  Headache, unspecified headache type  Visual changes   history of MS and chronic migraines now with gradual onset headache on the left side with left eye blurry vision. Opposite usual migraine location. No fever. Denies thunderclap onset. Intraocular pressures normal bilaterally.  Discussed with Dr. Doy Mince who feels patient needs imaging including MRI to rule out MS exacerbation. She agrees LP is not indicated emergently given no loss of vision and no thunderclap onset. Blurry vision rather than visual loss does not suggest NPH.  She recommends admission for further evaluation.  CT negative.   Patient reports that she received a phone call from her mother and there was a family emergency. She says she needs to leave immediately. She understands she is leaving [place]. She appears to have capacity to make decisions. Patient understands that she may be at risk for deterioration including blindness or death. She has capacity to make medical decisions and leave [place]. She understands she is free to return at any time.  Ezequiel Essex, MD 10/02/13 [phone]

## 2013-10-01 NOTE — ED Provider Notes (Signed)
Medical screening examination/treatment/procedure(s) were performed by non-physician practitioner and as supervising physician I was immediately available for consultation/collaboration.   EKG Interpretation None       Nat Christen, MD 10/01/13 2014

## 2013-10-01 NOTE — ED Notes (Signed)
PT monitored by pulse ox, bp cuff, and 5-lead. 

## 2013-10-02 ENCOUNTER — Emergency Department (HOSPITAL_COMMUNITY)
Admission: EM | Admit: 2013-10-02 | Discharge: 2013-10-02 | Disposition: A | Discharge status: Home / Self Care | Payer: Medicare Other | Attending: Emergency Medicine | Admitting: Emergency Medicine

## 2013-10-02 ENCOUNTER — Emergency Department (HOSPITAL_COMMUNITY): Payer: Medicare Other

## 2013-10-02 ENCOUNTER — Encounter (HOSPITAL_COMMUNITY): Payer: Self-pay | Admitting: Emergency Medicine

## 2013-10-02 DIAGNOSIS — G35 Multiple sclerosis: Secondary | ICD-10-CM | POA: Diagnosis not present

## 2013-10-02 DIAGNOSIS — K219 Gastro-esophageal reflux disease without esophagitis: Secondary | ICD-10-CM | POA: Diagnosis not present

## 2013-10-02 DIAGNOSIS — G43909 Migraine, unspecified, not intractable, without status migrainosus: Secondary | ICD-10-CM | POA: Insufficient documentation

## 2013-10-02 DIAGNOSIS — Z792 Long term (current) use of antibiotics: Secondary | ICD-10-CM | POA: Diagnosis not present

## 2013-10-02 DIAGNOSIS — F17200 Nicotine dependence, unspecified, uncomplicated: Secondary | ICD-10-CM | POA: Insufficient documentation

## 2013-10-02 DIAGNOSIS — J45909 Unspecified asthma, uncomplicated: Secondary | ICD-10-CM | POA: Diagnosis not present

## 2013-10-02 DIAGNOSIS — Z8742 Personal history of other diseases of the female genital tract: Secondary | ICD-10-CM | POA: Insufficient documentation

## 2013-10-02 DIAGNOSIS — G8929 Other chronic pain: Secondary | ICD-10-CM | POA: Insufficient documentation

## 2013-10-02 DIAGNOSIS — G43709 Chronic migraine without aura, not intractable, without status migrainosus: Secondary | ICD-10-CM | POA: Diagnosis not present

## 2013-10-02 DIAGNOSIS — F411 Generalized anxiety disorder: Secondary | ICD-10-CM | POA: Insufficient documentation

## 2013-10-02 DIAGNOSIS — Z79899 Other long term (current) drug therapy: Secondary | ICD-10-CM | POA: Diagnosis not present

## 2013-10-02 LAB — CBC WITH DIFFERENTIAL/PLATELET
BASOS PCT: 0 % (ref 0–1)
Basophils Absolute: 0 10*3/uL (ref 0.0–0.1)
Eosinophils Absolute: 0.1 10*3/uL (ref 0.0–0.7)
Eosinophils Relative: 1 % (ref 0–5)
HCT: 42.6 % (ref 36.0–46.0)
Hemoglobin: 14.5 g/dL (ref 12.0–15.0)
Lymphocytes Relative: 19 % (ref 12–46)
Lymphs Abs: 1.4 10*3/uL (ref 0.7–4.0)
MCH: 30.5 pg (ref 26.0–34.0)
MCHC: 34 g/dL (ref 30.0–36.0)
MCV: 89.7 fL (ref 78.0–100.0)
Monocytes Absolute: 0.4 10*3/uL (ref 0.1–1.0)
Monocytes Relative: 5 % (ref 3–12)
NEUTROS ABS: 5.7 10*3/uL (ref 1.7–7.7)
Neutrophils Relative %: 75 % (ref 43–77)
Platelets: 327 10*3/uL (ref 150–400)
RBC: 4.75 MIL/uL (ref 3.87–5.11)
RDW: 13.3 % (ref 11.5–15.5)
WBC: 7.6 10*3/uL (ref 4.0–10.5)

## 2013-10-02 LAB — BASIC METABOLIC PANEL
ANION GAP: 10 (ref 5–15)
BUN: 8 mg/dL (ref 6–23)
CO2: 25 mEq/L (ref 19–32)
Calcium: 8.6 mg/dL (ref 8.4–10.5)
Chloride: 105 mEq/L (ref 96–112)
Creatinine, Ser: 0.6 mg/dL (ref 0.50–1.10)
GFR calc non Af Amer: >90 mL/min (ref >90)
Glucose, Bld: 102 mg/dL — ABNORMAL HIGH (ref 70–99)
Potassium: 4.6 mEq/L (ref 3.7–5.3)
SODIUM: 140 meq/L (ref 137–147)

## 2013-10-02 MED ORDER — GADOBENATE DIMEGLUMINE 529 MG/ML IV SOLN
19.0000 mL | Freq: Once | INTRAVENOUS | Status: AC | PRN
  Administered 2013-10-02: 19 mL via INTRAVENOUS

## 2013-10-02 MED ORDER — HYDROMORPHONE HCL 1 MG/ML IJ SOLN
1.0000 mg | Freq: Once | INTRAMUSCULAR | Status: AC
  Administered 2013-10-02: 1 mg via INTRAVENOUS
  Filled 2013-10-02: qty 1

## 2013-10-02 MED ORDER — DIPHENHYDRAMINE HCL 50 MG/ML IJ SOLN
12.5000 mg | Freq: Once | INTRAMUSCULAR | Status: AC
  Administered 2013-10-02: 12.5 mg via INTRAVENOUS
  Filled 2013-10-02: qty 1

## 2013-10-02 MED ORDER — SODIUM CHLORIDE 0.9 % IV BOLUS (SEPSIS)
1000.0000 mL | Freq: Once | INTRAVENOUS | Status: AC
  Administered 2013-10-02: 1000 mL via INTRAVENOUS

## 2013-10-02 MED ORDER — ONDANSETRON HCL 4 MG/2ML IJ SOLN
4.0000 mg | Freq: Once | INTRAMUSCULAR | Status: AC
  Administered 2013-10-02: 4 mg via INTRAVENOUS
  Filled 2013-10-02: qty 2

## 2013-10-02 MED ORDER — LORAZEPAM 2 MG/ML IJ SOLN
1.0000 mg | Freq: Once | INTRAMUSCULAR | Status: AC
  Administered 2013-10-02: 1 mg via INTRAVENOUS

## 2013-10-02 MED ORDER — HYDROMORPHONE HCL 1 MG/ML IJ SOLN
1.0000 mg | Freq: Once | INTRAMUSCULAR | Status: AC
Start: 2013-10-02 — End: 2013-10-02
  Administered 2013-10-02: 1 mg via INTRAVENOUS
  Filled 2013-10-02: qty 1

## 2013-10-02 MED ORDER — LORAZEPAM 2 MG/ML IJ SOLN
INTRAMUSCULAR | Status: AC
  Filled 2013-10-02: qty 1

## 2013-10-02 MED ORDER — DEXAMETHASONE SODIUM PHOSPHATE 10 MG/ML IJ SOLN
10.0000 mg | Freq: Once | INTRAMUSCULAR | Status: AC
  Administered 2013-10-02: 10 mg via INTRAVENOUS
  Filled 2013-10-02: qty 1

## 2013-10-02 NOTE — ED Notes (Signed)
PA at bedside.

## 2013-10-02 NOTE — ED Provider Notes (Signed)
CSN: [id number]     Arrival date & time 10/02/13  0902 History   First MD Initiated Contact with Patient 10/02/13 [phone]     Chief Complaint  Patient presents with  . Migraine     (Consider location/radiation/quality/duration/timing/severity/associated sxs/prior Treatment) The history is provided by the patient and the spouse.   Emily [name] is a 33 y.o. female with a history of MS and associated chronic intermittent right sided migraine headache.  She now has a 3 day history of left sided headache similar to other migraines, but on the opposite side of head associated with left visual blurriness, although this is improved today, stating she was not able to see out of the eye yesterday, but today can recognize shapes.  She denies neck pain or stiffness, recent uri's, fever.  She was seen by Dr Wyvonnia Dusky at [facility] last night with normal labs and CT scan performed. Upon discussion with neurology, it was recommended to admit for MRI and further evaluation to determine if sx are MS flare.  She left ama secondary to a family emergency, but has returned today willing for admission but is also desirous of continued control of headache pain.  Her headache is constant, 10/10 without alleviators.  She has nausea without emesis, positive photophobia.  She denies focal weakness or dizziness.  She uses morphine at home which is not currently relieving her headache pain.    Past Medical History  Diagnosis Date  . Headache(784.0)     migraines  . Multiple sclerosis   . Asthma as a child  . GERD (gastroesophageal reflux disease)   . Urinary urgency   . Anxiety   . Perforated bowel 2009  . Migraines   . Depression   . Chronic back pain   . Chronic pain   . Ovarian cyst    Past Surgical History  Procedure Laterality Date  . Extremity cyst excision  1994    right leg  . 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 Colostomy closure  04/2007  . Scar revision  01/21/2011    Procedure: SCAR REVISION;   Surgeon: Hermelinda Dellen;  Location: [place];  Service: Plastics;  Laterality: N/A;  exploration of scar of abdomen and repair of defect  . Hernia repair    . Abdominal surgery    . Appendectomy     No family history on file. History  Substance Use Topics  . Smoking status: Current Some Day Smoker    Types: Cigarettes  . Smokeless tobacco: Never Used     Comment: quit smoking 10 days ago  . Alcohol Use: Yes     Comment: Rare   OB History   Grav Para Term Preterm Abortions TAB SAB Ect Mult Living                 Review of Systems  Constitutional: Negative for fever.  HENT: Negative for congestion and sore throat.   Eyes: Positive for photophobia and visual disturbance.  Respiratory: Negative for chest tightness and shortness of breath.   Cardiovascular: Negative for chest pain.  Gastrointestinal: Negative for nausea and abdominal pain.  Genitourinary: Negative.   Musculoskeletal: Negative for arthralgias, joint swelling and neck pain.  Skin: Negative.  Negative for rash and wound.  Neurological: Positive for headaches. Negative for dizziness, weakness, light-headedness and numbness.  Psychiatric/Behavioral: Negative.       Allergies  Amitriptyline; Baclofen; Cymbalta; Gabapentin; Monosodium glutamate; Vicodin; Alprazolam; Magnesium salicylate; Rizatriptan; Tizanidine;  Adhesive; and Lamotrigine  Home Medications   Prior to Admission medications   Medication Sig Start Date End Date Taking? Authorizing Provider  carbamazepine (CARBATROL) 200 MG 12 hr capsule Take 200 mg by mouth 3 (three) times daily. 04/22/13 04/22/14 Yes Historical Provider, MD  clindamycin (CLEOCIN) 150 MG capsule Take 150 mg by mouth 4 (four) times daily. 7 day course started 09/29/13   Yes Historical Provider, MD  clonazePAM (KLONOPIN) 1 MG tablet Take 1 mg by mouth 2 (two) times daily as needed for anxiety.    Yes Historical Provider, MD  divalproex (DEPAKOTE ER) 500 MG 24 hr tablet  Take 500 mg by mouth daily. For depression   Yes Historical Provider, MD  Fingolimod HCl (GILENYA) 0.5 MG CAPS Take 0.5 mg by mouth daily.    Yes Historical Provider, MD  ibuprofen (ADVIL,MOTRIN) 600 MG tablet Take 600 mg by mouth daily as needed (pain).   Yes Historical Provider, MD  loratadine (CLARITIN) 10 MG tablet Take 10 mg by mouth daily.    Yes Historical Provider, MD  metoCLOPramide (REGLAN) 10 MG tablet Take 1 tablet (10 mg total) by mouth every 6 (six) hours as needed for nausea (or headache). 08/10/70  Yes Delora Fuel, MD  morphine (MSIR) 15 MG tablet Take 15 mg by mouth 4 (four) times daily.   Yes Historical Provider, MD  neomycin-bacitracin-polymyxin (NEOSPORIN) OINT Apply 1 application topically 4 (four) times daily as needed for wound care (burn treatment).   Yes Historical Provider, MD  omeprazole (PRILOSEC) 40 MG capsule Take 40 mg by mouth daily.    Yes Historical Provider, MD  ondansetron (ZOFRAN) 8 MG tablet Take 8 mg by mouth every 6 (six) hours as needed for nausea or vomiting.    Yes Historical Provider, MD  potassium chloride (K-DUR) 10 MEQ tablet Take 10 mEq by mouth 2 (two) times daily.   Yes Historical Provider, MD  pregabalin (LYRICA) 200 MG capsule Take 600 mg by mouth at bedtime.   Yes Historical Provider, MD  promethazine (PHENERGAN) 25 MG tablet Take 25 mg by mouth every 6 (six) hours as needed for nausea or vomiting.    Yes Historical Provider, MD  propranolol (INDERAL) 10 MG tablet Take 10 mg by mouth 2 (two) times daily.   Yes Historical Provider, MD  ranitidine (ZANTAC) 300 MG capsule Take 300 mg by mouth at bedtime.    Yes Historical Provider, MD  topiramate (TOPAMAX) 100 MG tablet Take 100 mg by mouth 2 (two) times daily.    Yes Historical Provider, MD  EPINEPHrine (EPIPEN) 0.3 mg/0.3 mL SOAJ injection Inject 0.3 mg into the muscle as needed (allergic reaction).     Historical Provider, MD  medroxyPROGESTERone (DEPO-PROVERA) 150 MG/ML injection Inject 150 mg into  the muscle every 3 (three) months. Last injection mid September    Historical Provider, MD   BP 141/99  Pulse 73  Temp(Src) 97.7 F (36.5 C) (Oral)  Resp 18  Ht 5\' 5"  (1.651 m)  Wt 200 lb (90.719 kg)  BMI 33.28 kg/m2  SpO2 100% Physical Exam  Nursing note and vitals reviewed. Constitutional: She appears well-developed and well-nourished.  HENT:  Head: Normocephalic and atraumatic.  Eyes: Conjunctivae and EOM are normal. Pupils are equal, round, and reactive to light. Right eye exhibits normal extraocular motion and no nystagmus. Left eye exhibits normal extraocular motion and no nystagmus.  Neck: Normal range of motion. Neck supple. No rigidity. Normal range of motion present.  Cardiovascular: Normal rate, regular rhythm,  normal heart sounds and intact distal pulses.   Pulmonary/Chest: Effort normal and breath sounds normal. She has no wheezes.  Abdominal: Soft. Bowel sounds are normal. There is no tenderness.  Musculoskeletal: Normal range of motion.  Lymphadenopathy:    She has no cervical adenopathy.  Neurological: She is alert. She has normal strength. No cranial nerve deficit or sensory deficit. She exhibits normal muscle tone. Gait normal.  Difficulty with heel/toe walk.  No pronator drift,  5/5 strength upper and lower extremities,  Equal grip strength.  Normal  Regular gait.  Skin: Skin is warm and dry.  Psychiatric: She has a normal mood and affect.    ED Course  Procedures (including critical care time) Labs Review Labs Reviewed  BASIC METABOLIC PANEL - Abnormal; Notable for the following:    Glucose, Bld 102 (*)    All other components within normal limits  CBC WITH DIFFERENTIAL    Imaging Review Ct Head Wo Contrast  10/01/2013   CLINICAL DATA:  Migraine  EXAM: CT HEAD WITHOUT CONTRAST  TECHNIQUE: Contiguous axial images were obtained from the base of the skull through the vertex without intravenous contrast.  COMPARISON:  09/12/2013  FINDINGS: There is no  evidence of mass effect, midline shift or extra-axial fluid collections. There is no evidence of a space-occupying lesion or intracranial hemorrhage. There is no evidence of a cortical-based area of acute infarction.  The ventricles and sulci are appropriate for the patient's age. The basal cisterns are patent.  Visualized portions of the orbits are unremarkable. The visualized portions of the paranasal sinuses and mastoid air cells are unremarkable.  The osseous structures are unremarkable.  IMPRESSION: Normal CT of the brain without intravenous contrast.   Electronically Signed   By: Kathreen Devoid   On: 10/01/2013 18:26   Mr Brain W Wo Contrast  10/02/2013   CLINICAL DATA:  Migraines with visual changes over the last 3 days. History of multiple sclerosis diagnosed in 2008.  EXAM: MRI HEAD WITHOUT AND WITH CONTRAST  TECHNIQUE: Multiplanar, multiecho pulse sequences of the brain and surrounding structures were obtained without and with intravenous contrast.  CONTRAST:  56mL MULTIHANCE GADOBENATE DIMEGLUMINE 529 MG/ML IV SOLN  COMPARISON:  Head CT 10/01/2013 and MRI 11/04/2011  FINDINGS: There is no evidence of acute infarct, intracranial hemorrhage, mass, midline shift, or extra-axial fluid collection. Multiple scattered foci of T2 hyperintensity are again seen in the subcortical and deep cerebral white matter bilaterally. The lesions are overall very similar in appearance to the prior study with evidence of likely only minimal progression. For example, a 7 mm lesion in the right corona radiata was not present on the prior study (series 8, image 15 and series 106, image 23). There may be mildly increased FLAIR signal along the callososeptal interface. No enhancing lesions are identified. No definite infratentorial lesions are identified. The ventricles and sulci are within normal limits.  Orbits are unremarkable. Mild right maxillary sinus mucosal thickening is noted. Mastoid air cells are clear. Major  intracranial vascular flow voids are preserved.  IMPRESSION: Minimal progression of cerebral white matter lesions, consistent with multiple sclerosis. No evidence of active demyelination.   Electronically Signed   By: Logan Bores   On: 10/02/2013 12:47     EKG Interpretation None      MDM   Final diagnoses:  Chronic migraine without aura without status migrainosus, not intractable    Patients labs and/or radiological studies were viewed and considered during the medical decision making and  disposition process.  Pt discussed with Dr. Tomi Bamberger.  Time spent reviewing prior chart including yesterdays visit and admission recommendation by neuro.  Spoke with Dr. Doy Mince with neuro hospitalist service.  Given stable MRI, resolving visual changes, no need for hospitalization at this time.  Pt was encouraged  F/u with her pcp which she plans to do this week, stating he is working on referral to [facility] for her.  Continue current home meds.  Pt understands and agrees with plan.    Evalee Jefferson, PA-C 10/02/13 2206

## 2013-10-02 NOTE — ED Notes (Signed)
Migraine to left side of head x 3 days with nausea, light and sound sensitivity

## 2013-10-02 NOTE — Discharge Instructions (Signed)
Recurrent Migraine Headache A migraine headache is an intense, throbbing pain on one or both sides of your head. Recurrent migraines keep coming back. A migraine can last for 30 minutes to several hours. CAUSES  The exact cause of a migraine headache is not always known. However, a migraine may be caused when nerves in the brain become irritated and release chemicals that cause inflammation. This causes pain. Certain things may also trigger migraines, such as:   Alcohol.  Smoking.  Stress.  Menstruation.  Aged cheeses.  Foods or drinks that contain nitrates, glutamate, aspartame, or tyramine.  Lack of sleep.  Chocolate.  Caffeine.  Hunger.  Physical exertion.  Fatigue.  Medicines used to treat chest pain (nitroglycerine), birth control pills, estrogen, and some blood pressure medicines. SYMPTOMS   Pain on one or both sides of your head.  Pulsating or throbbing pain.  Severe pain that prevents daily activities.  Pain that is aggravated by any physical activity.  Nausea, vomiting, or both.  Dizziness.  Pain with exposure to bright lights, loud noises, or activity.  General sensitivity to bright lights, loud noises, or smells. Before you get a migraine, you may get warning signs that a migraine is coming (aura). An aura may include:  Seeing flashing lights.  Seeing bright spots, halos, or zigzag lines.  Having tunnel vision or blurred vision.  Having feelings of numbness or tingling.  Having trouble talking.  Having muscle weakness. DIAGNOSIS  A recurrent migraine headache is often diagnosed based on:  Symptoms.  Physical examination.  A CT scan or MRI of your head. These imaging tests cannot diagnose migraines but can help rule out other causes of headaches.  TREATMENT  Medicines may be given for pain and nausea. Medicines can also be given to help prevent recurrent migraines. HOME CARE INSTRUCTIONS  Only take over-the-counter or prescription  medicines for pain or discomfort as directed by your health care provider. The use of long-term narcotics is not recommended.  Lie down in a dark, quiet room when you have a migraine.  Keep a journal to find out what may trigger your migraine headaches. For example, write down:  What you eat and drink.  How much sleep you get.  Any change to your diet or medicines.  Limit alcohol consumption.  Quit smoking if you smoke.  Get 7-9 hours of sleep, or as recommended by your health care provider.  Limit stress.  Keep lights dim if bright lights bother you and make your migraines worse. SEEK MEDICAL CARE IF:   You do not get relief from the medicines given to you.  You have a recurrence of pain.  You have a fever. SEEK IMMEDIATE MEDICAL CARE IF:  Your migraine becomes severe.  You have a stiff neck.  You have loss of vision.  You have muscular weakness or loss of muscle control.  You start losing your balance or have trouble walking.  You feel faint or pass out.  You have severe symptoms that are different from your first symptoms. MAKE SURE YOU:   Understand these instructions.  Will watch your condition.  Will get help right away if you are not doing well or get worse. Document Released: 09/18/2000 Document Revised: 05/10/2013 Document Reviewed: 08/31/2012 [employer] Patient Information 2015 [employer]. This information is not intended to replace advice given to you by your health care provider. Make sure you discuss any questions you have with your health care provider.  

## 2013-10-03 ENCOUNTER — Emergency Department (HOSPITAL_COMMUNITY)
Admission: EM | Admit: 2013-10-03 | Discharge: 2013-10-03 | Disposition: A | Discharge status: Home / Self Care | Payer: Medicare Other | Attending: Emergency Medicine | Admitting: Emergency Medicine

## 2013-10-03 ENCOUNTER — Encounter (HOSPITAL_COMMUNITY): Payer: Self-pay | Admitting: Emergency Medicine

## 2013-10-03 DIAGNOSIS — G43809 Other migraine, not intractable, without status migrainosus: Secondary | ICD-10-CM | POA: Diagnosis not present

## 2013-10-03 DIAGNOSIS — F17200 Nicotine dependence, unspecified, uncomplicated: Secondary | ICD-10-CM | POA: Insufficient documentation

## 2013-10-03 DIAGNOSIS — F411 Generalized anxiety disorder: Secondary | ICD-10-CM | POA: Insufficient documentation

## 2013-10-03 DIAGNOSIS — Z8742 Personal history of other diseases of the female genital tract: Secondary | ICD-10-CM | POA: Diagnosis not present

## 2013-10-03 DIAGNOSIS — F329 Major depressive disorder, single episode, unspecified: Secondary | ICD-10-CM | POA: Diagnosis not present

## 2013-10-03 DIAGNOSIS — Z792 Long term (current) use of antibiotics: Secondary | ICD-10-CM | POA: Insufficient documentation

## 2013-10-03 DIAGNOSIS — K219 Gastro-esophageal reflux disease without esophagitis: Secondary | ICD-10-CM | POA: Insufficient documentation

## 2013-10-03 DIAGNOSIS — Z79899 Other long term (current) drug therapy: Secondary | ICD-10-CM | POA: Diagnosis not present

## 2013-10-03 DIAGNOSIS — F3289 Other specified depressive episodes: Secondary | ICD-10-CM | POA: Diagnosis not present

## 2013-10-03 DIAGNOSIS — R197 Diarrhea, unspecified: Secondary | ICD-10-CM | POA: Diagnosis not present

## 2013-10-03 DIAGNOSIS — J45909 Unspecified asthma, uncomplicated: Secondary | ICD-10-CM | POA: Insufficient documentation

## 2013-10-03 DIAGNOSIS — G8929 Other chronic pain: Secondary | ICD-10-CM | POA: Diagnosis not present

## 2013-10-03 DIAGNOSIS — G43909 Migraine, unspecified, not intractable, without status migrainosus: Secondary | ICD-10-CM | POA: Diagnosis present

## 2013-10-03 MED ORDER — SODIUM CHLORIDE 0.9 % IV BOLUS (SEPSIS)
1000.0000 mL | Freq: Once | INTRAVENOUS | Status: AC
  Administered 2013-10-03: 1000 mL via INTRAVENOUS

## 2013-10-03 MED ORDER — KETOROLAC TROMETHAMINE 30 MG/ML IJ SOLN
30.0000 mg | Freq: Once | INTRAMUSCULAR | Status: AC
  Administered 2013-10-03: 30 mg via INTRAVENOUS
  Filled 2013-10-03: qty 1

## 2013-10-03 MED ORDER — DIPHENHYDRAMINE HCL 50 MG/ML IJ SOLN
25.0000 mg | Freq: Once | INTRAMUSCULAR | Status: AC
  Administered 2013-10-03: 25 mg via INTRAVENOUS
  Filled 2013-10-03: qty 1

## 2013-10-03 MED ORDER — METOCLOPRAMIDE HCL 5 MG/ML IJ SOLN
10.0000 mg | Freq: Once | INTRAMUSCULAR | Status: AC
  Administered 2013-10-03: 10 mg via INTRAVENOUS
  Filled 2013-10-03: qty 2

## 2013-10-03 MED ORDER — DEXAMETHASONE SODIUM PHOSPHATE 10 MG/ML IJ SOLN
10.0000 mg | Freq: Once | INTRAMUSCULAR | Status: AC
  Administered 2013-10-03: 10 mg via INTRAVENOUS
  Filled 2013-10-03: qty 1

## 2013-10-03 NOTE — Discharge Instructions (Signed)
Followup with your neurologist to discuss the increased frequency pattern of your headaches.   Migraine Headache A migraine headache is an intense, throbbing pain on one or both sides of your head. A migraine can last for 30 minutes to several hours. CAUSES  The exact cause of a migraine headache is not always known. However, a migraine may be caused when nerves in the brain become irritated and release chemicals that cause inflammation. This causes pain. Certain things may also trigger migraines, such as:  Alcohol.  Smoking.  Stress.  Menstruation.  Aged cheeses.  Foods or drinks that contain nitrates, glutamate, aspartame, or tyramine.  Lack of sleep.  Chocolate.  Caffeine.  Hunger.  Physical exertion.  Fatigue.  Medicines used to treat chest pain (nitroglycerine), birth control pills, estrogen, and some blood pressure medicines. SIGNS AND SYMPTOMS  Pain on one or both sides of your head.  Pulsating or throbbing pain.  Severe pain that prevents daily activities.  Pain that is aggravated by any physical activity.  Nausea, vomiting, or both.  Dizziness.  Pain with exposure to bright lights, loud noises, or activity.  General sensitivity to bright lights, loud noises, or smells. Before you get a migraine, you may get warning signs that a migraine is coming (aura). An aura may include:  Seeing flashing lights.  Seeing bright spots, halos, or zigzag lines.  Having tunnel vision or blurred vision.  Having feelings of numbness or tingling.  Having trouble talking.  Having muscle weakness. DIAGNOSIS  A migraine headache is often diagnosed based on:  Symptoms.  Physical exam.  A CT scan or MRI of your head. These imaging tests cannot diagnose migraines, but they can help rule out other causes of headaches. TREATMENT Medicines may be given for pain and nausea. Medicines can also be given to help prevent recurrent migraines.  HOME CARE  INSTRUCTIONS  Only take over-the-counter or prescription medicines for pain or discomfort as directed by your health care provider. The use of long-term narcotics is not recommended.  Lie down in a dark, quiet room when you have a migraine.  Keep a journal to find out what may trigger your migraine headaches. For example, write down:  What you eat and drink.  How much sleep you get.  Any change to your diet or medicines.  Limit alcohol consumption.  Quit smoking if you smoke.  Get 7-9 hours of sleep, or as recommended by your health care provider.  Limit stress.  Keep lights dim if bright lights bother you and make your migraines worse. SEEK IMMEDIATE MEDICAL CARE IF:   Your migraine becomes severe.  You have a fever.  You have a stiff neck.  You have vision loss.  You have muscular weakness or loss of muscle control.  You start losing your balance or have trouble walking.  You feel faint or pass out.  You have severe symptoms that are different from your first symptoms. MAKE SURE YOU:   Understand these instructions.  Will watch your condition.  Will get help right away if you are not doing well or get worse. Document Released: 12/24/2004 Document Revised: 05/10/2013 Document Reviewed: 08/31/2012 [facility] Patient Information 2015 [place], [place]. This information is not intended to replace advice given to you by your health care provider. Make sure you discuss any questions you have with your health care provider.

## 2013-10-03 NOTE — ED Provider Notes (Signed)
CSN: [id number]     Arrival date & time 10/03/13  1037 History   First MD Initiated Contact with Patient 10/03/13 1117     Chief Complaint  Patient presents with  . Migraine  . Diarrhea  . Abdominal Cramping     (Consider location/radiation/quality/duration/timing/severity/associated sxs/prior Treatment) HPI Comments: Patient is a 33 year old female with history of migraine headaches, multiple sclerosis. She presents today with complaints of severe headache. She states that her pain is throughout her whole head and reports she is "seeing stars". This feels similar to her prior migraines however is much worse.  She was seen yesterday at [facility] and had an MRI of the brain which was unremarkable. The provider of their discussed the case with neurology who did not feel as though hospitalization was indicated. She was feeling better upon discharge, however this morning her headache has returned. There is no injury or trauma.  Patient is a 33 y.o. female presenting with migraines, diarrhea, and cramps. The history is provided by the patient.  Migraine This is a recurrent problem. The current episode started 3 to 5 hours ago. The problem occurs constantly. The problem has been rapidly worsening. Associated symptoms include headaches. Nothing aggravates the symptoms. Nothing relieves the symptoms. She has tried nothing for the symptoms. The treatment provided no relief.  Diarrhea Associated symptoms: headaches   Abdominal Cramping Associated symptoms: diarrhea     Past Medical History  Diagnosis Date  . Headache(784.0)     migraines  . Multiple sclerosis   . Asthma as a child  . GERD (gastroesophageal reflux disease)   . Urinary urgency   . Anxiety   . Perforated bowel 2009  . Migraines   . Depression   . Chronic back pain   . Chronic pain   . Ovarian cyst    Past Surgical History  Procedure Laterality Date  . Extremity cyst excision  1994    right leg  . Bowel resection   01/2007    with colostomy  . Colostomy closure  04/2007  . Scar revision  01/21/2011    Procedure: SCAR REVISION;  Surgeon: Hermelinda Dellen;  Location: [place];  Service: Plastics;  Laterality: N/A;  exploration of scar of abdomen and repair of defect  . Hernia repair    . Abdominal surgery    . Appendectomy     History reviewed. No pertinent family history. History  Substance Use Topics  . Smoking status: Current Some Day Smoker    Types: Cigarettes  . Smokeless tobacco: Never Used     Comment: quit smoking 10 days ago  . Alcohol Use: Yes     Comment: Rare   OB History   Grav Para Term Preterm Abortions TAB SAB Ect Mult Living                 Review of Systems  Gastrointestinal: Positive for diarrhea.  Neurological: Positive for headaches.  All other systems reviewed and are negative.     Allergies  Amitriptyline; Baclofen; Cymbalta; Gabapentin; Monosodium glutamate; Vicodin; Alprazolam; Magnesium salicylate; Rizatriptan; Tizanidine; Adhesive; and Lamotrigine  Home Medications   Prior to Admission medications   Medication Sig Start Date End Date Taking? Authorizing Provider  carbamazepine (CARBATROL) 200 MG 12 hr capsule Take 200 mg by mouth 3 (three) times daily. 04/22/13 04/22/14  Historical Provider, MD  clindamycin (CLEOCIN) 150 MG capsule Take 150 mg by mouth 4 (four) times daily. 7 day course started 09/29/13  Historical Provider, MD  clonazePAM (KLONOPIN) 1 MG tablet Take 1 mg by mouth 2 (two) times daily as needed for anxiety.     Historical Provider, MD  divalproex (DEPAKOTE ER) 500 MG 24 hr tablet Take 500 mg by mouth daily. For depression    Historical Provider, MD  EPINEPHrine (EPIPEN) 0.3 mg/0.3 mL SOAJ injection Inject 0.3 mg into the muscle as needed (allergic reaction).     Historical Provider, MD  Fingolimod HCl (GILENYA) 0.5 MG CAPS Take 0.5 mg by mouth daily.     Historical Provider, MD  ibuprofen (ADVIL,MOTRIN) 600 MG tablet Take 600  mg by mouth daily as needed (pain).    Historical Provider, MD  loratadine (CLARITIN) 10 MG tablet Take 10 mg by mouth daily.     Historical Provider, MD  medroxyPROGESTERone (DEPO-PROVERA) 150 MG/ML injection Inject 150 mg into the muscle every 3 (three) months. Last injection mid September    Historical Provider, MD  metoCLOPramide (REGLAN) 10 MG tablet Take 1 tablet (10 mg total) by mouth every 6 (six) hours as needed for nausea (or headache). 06/12/57   Delora Fuel, MD  morphine (MSIR) 15 MG tablet Take 15 mg by mouth 4 (four) times daily.    Historical Provider, MD  neomycin-bacitracin-polymyxin (NEOSPORIN) OINT Apply 1 application topically 4 (four) times daily as needed for wound care (burn treatment).    Historical Provider, MD  omeprazole (PRILOSEC) 40 MG capsule Take 40 mg by mouth daily.     Historical Provider, MD  ondansetron (ZOFRAN) 8 MG tablet Take 8 mg by mouth every 6 (six) hours as needed for nausea or vomiting.     Historical Provider, MD  potassium chloride (K-DUR) 10 MEQ tablet Take 10 mEq by mouth 2 (two) times daily.    Historical Provider, MD  pregabalin (LYRICA) 200 MG capsule Take 600 mg by mouth at bedtime.    Historical Provider, MD  promethazine (PHENERGAN) 25 MG tablet Take 25 mg by mouth every 6 (six) hours as needed for nausea or vomiting.     Historical Provider, MD  propranolol (INDERAL) 10 MG tablet Take 10 mg by mouth 2 (two) times daily.    Historical Provider, MD  ranitidine (ZANTAC) 300 MG capsule Take 300 mg by mouth at bedtime.     Historical Provider, MD  topiramate (TOPAMAX) 100 MG tablet Take 100 mg by mouth 2 (two) times daily.     Historical Provider, MD   BP 137/99  Pulse 82  Temp(Src) 98.2 F (36.8 C) (Oral)  Resp 16  SpO2 98% Physical Exam  Nursing note and vitals reviewed. Constitutional: She is oriented to person, place, and time. She appears well-developed and well-nourished. No distress.  HENT:  Head: Normocephalic and atraumatic.   Mouth/Throat: Oropharynx is clear and moist.  Eyes: EOM are normal. Pupils are equal, round, and reactive to light.  There is no papilledema on funduscopic exam.  Neck: Normal range of motion. Neck supple.  Cardiovascular: Normal rate and regular rhythm.  Exam reveals no gallop and no friction rub.   No murmur heard. Pulmonary/Chest: Effort normal and breath sounds normal. No respiratory distress. She has no wheezes.  Abdominal: Soft. Bowel sounds are normal. She exhibits no distension. There is no tenderness.  Musculoskeletal: Normal range of motion.  Neurological: She is alert and oriented to person, place, and time. No cranial nerve deficit. She exhibits normal muscle tone. Coordination normal.  Skin: Skin is warm and dry. She is not diaphoretic.  ED Course  Procedures (including critical care time) Labs Review Labs Reviewed - No data to display  Imaging Review Ct Head Wo Contrast  10/01/2013   CLINICAL DATA:  Migraine  EXAM: CT HEAD WITHOUT CONTRAST  TECHNIQUE: Contiguous axial images were obtained from the base of the skull through the vertex without intravenous contrast.  COMPARISON:  09/12/2013  FINDINGS: There is no evidence of mass effect, midline shift or extra-axial fluid collections. There is no evidence of a space-occupying lesion or intracranial hemorrhage. There is no evidence of a cortical-based area of acute infarction.  The ventricles and sulci are appropriate for the patient's age. The basal cisterns are patent.  Visualized portions of the orbits are unremarkable. The visualized portions of the paranasal sinuses and mastoid air cells are unremarkable.  The osseous structures are unremarkable.  IMPRESSION: Normal CT of the brain without intravenous contrast.   Electronically Signed   By: Kathreen Devoid   On: 10/01/2013 18:26   Mr Brain W Wo Contrast  10/02/2013   CLINICAL DATA:  Migraines with visual changes over the last 3 days. History of multiple sclerosis diagnosed in  2008.  EXAM: MRI HEAD WITHOUT AND WITH CONTRAST  TECHNIQUE: Multiplanar, multiecho pulse sequences of the brain and surrounding structures were obtained without and with intravenous contrast.  CONTRAST:  34mL MULTIHANCE GADOBENATE DIMEGLUMINE 529 MG/ML IV SOLN  COMPARISON:  Head CT 10/01/2013 and MRI 11/04/2011  FINDINGS: There is no evidence of acute infarct, intracranial hemorrhage, mass, midline shift, or extra-axial fluid collection. Multiple scattered foci of T2 hyperintensity are again seen in the subcortical and deep cerebral white matter bilaterally. The lesions are overall very similar in appearance to the prior study with evidence of likely only minimal progression. For example, a 7 mm lesion in the right corona radiata was not present on the prior study (series 8, image 15 and series 106, image 23). There may be mildly increased FLAIR signal along the callososeptal interface. No enhancing lesions are identified. No definite infratentorial lesions are identified. The ventricles and sulci are within normal limits.  Orbits are unremarkable. Mild right maxillary sinus mucosal thickening is noted. Mastoid air cells are clear. Major intracranial vascular flow voids are preserved.  IMPRESSION: Minimal progression of cerebral white matter lesions, consistent with multiple sclerosis. No evidence of active demyelination.   Electronically Signed   By: Logan Bores   On: 10/02/2013 12:47     EKG Interpretation None      MDM   Final diagnoses:  None    Patient is a 33 year old female with history of migraine headaches and recurrent ER visits for painful conditions. She presents today with a headache. Her neurologic exam is nonfocal and MRI yesterday was unremarkable at [facility].  She is given a migraine cocktail and is feeling better. I believe she is appropriate for discharge, to followup with her neurologist to discuss.    [name], MD 10/03/13 1318

## 2013-10-03 NOTE — ED Notes (Addendum)
Migraines x 4 days. Started on left, and now radiating to rt. Some blurred vision. Also has been experiencing diarrhea. Took morphine sulfate po 15 mg ~ 0700 with no relief. At [facility] yesterday and did have an MRI  And treated for a migraine. Told to come here if needed if not getting better.

## 2013-10-04 ENCOUNTER — Encounter (HOSPITAL_COMMUNITY): Payer: Self-pay | Admitting: Emergency Medicine

## 2013-10-04 ENCOUNTER — Emergency Department (HOSPITAL_COMMUNITY)
Admission: EM | Admit: 2013-10-04 | Discharge: 2013-10-04 | Disposition: A | Discharge status: Home / Self Care | Payer: Medicare Other | Attending: Emergency Medicine | Admitting: Emergency Medicine

## 2013-10-04 ENCOUNTER — Emergency Department (HOSPITAL_COMMUNITY): Payer: Medicare Other

## 2013-10-04 DIAGNOSIS — F3289 Other specified depressive episodes: Secondary | ICD-10-CM | POA: Diagnosis not present

## 2013-10-04 DIAGNOSIS — J45909 Unspecified asthma, uncomplicated: Secondary | ICD-10-CM | POA: Diagnosis not present

## 2013-10-04 DIAGNOSIS — R05 Cough: Secondary | ICD-10-CM | POA: Insufficient documentation

## 2013-10-04 DIAGNOSIS — G35 Multiple sclerosis: Secondary | ICD-10-CM | POA: Insufficient documentation

## 2013-10-04 DIAGNOSIS — R61 Generalized hyperhidrosis: Secondary | ICD-10-CM | POA: Diagnosis not present

## 2013-10-04 DIAGNOSIS — M546 Pain in thoracic spine: Secondary | ICD-10-CM | POA: Insufficient documentation

## 2013-10-04 DIAGNOSIS — F329 Major depressive disorder, single episode, unspecified: Secondary | ICD-10-CM | POA: Insufficient documentation

## 2013-10-04 DIAGNOSIS — G43909 Migraine, unspecified, not intractable, without status migrainosus: Secondary | ICD-10-CM | POA: Diagnosis not present

## 2013-10-04 DIAGNOSIS — F17200 Nicotine dependence, unspecified, uncomplicated: Secondary | ICD-10-CM | POA: Diagnosis not present

## 2013-10-04 DIAGNOSIS — K219 Gastro-esophageal reflux disease without esophagitis: Secondary | ICD-10-CM | POA: Diagnosis not present

## 2013-10-04 DIAGNOSIS — Z79899 Other long term (current) drug therapy: Secondary | ICD-10-CM | POA: Insufficient documentation

## 2013-10-04 DIAGNOSIS — G8929 Other chronic pain: Secondary | ICD-10-CM | POA: Diagnosis not present

## 2013-10-04 DIAGNOSIS — R42 Dizziness and giddiness: Secondary | ICD-10-CM | POA: Insufficient documentation

## 2013-10-04 DIAGNOSIS — F411 Generalized anxiety disorder: Secondary | ICD-10-CM | POA: Diagnosis not present

## 2013-10-04 DIAGNOSIS — R079 Chest pain, unspecified: Secondary | ICD-10-CM | POA: Insufficient documentation

## 2013-10-04 DIAGNOSIS — R059 Cough, unspecified: Secondary | ICD-10-CM | POA: Diagnosis not present

## 2013-10-04 DIAGNOSIS — Z8742 Personal history of other diseases of the female genital tract: Secondary | ICD-10-CM | POA: Insufficient documentation

## 2013-10-04 DIAGNOSIS — R109 Unspecified abdominal pain: Secondary | ICD-10-CM | POA: Diagnosis not present

## 2013-10-04 MED ORDER — OXYCODONE-ACETAMINOPHEN 5-325 MG PO TABS
1.0000 | ORAL_TABLET | Freq: Once | ORAL | Status: AC
  Administered 2013-10-04: 1 via ORAL
  Filled 2013-10-04: qty 1

## 2013-10-04 NOTE — Discharge Instructions (Signed)

## 2013-10-04 NOTE — ED Provider Notes (Signed)
CSN: [id number]     Arrival date & time 10/04/13  2951 History   This chart was scribed for Sharyon Cable, MD by Molli Posey, ED Scribe. This patient was seen in room APA12/APA12 and the patient's care was started 10:50 AM.     Chief Complaint  Patient presents with  . Chest Pain    Patient is a 33 y.o. female presenting with chest pain. The history is provided by the patient. No language interpreter was used.  Chest Pain Pain location:  Substernal area Pain quality: radiating   Pain radiates to:  Upper back Pain radiates to the back: yes   Pain severity:  Moderate Onset quality:  Gradual Duration:  1 day Timing:  Constant Progression:  Worsening Chronicity:  New Context: at rest   Worsened by:  Coughing and certain positions Associated symptoms: abdominal pain, back pain, cough, diaphoresis and dizziness   Associated symptoms: no fever, no lower extremity edema, no shortness of breath and not vomiting    HPI Comments: Emily [name] is a 33 y.o. female who presents to the Emergency Department complaining of constant, gradually worsening central CP that radiates to upper back that started last night. She describes her chest pain as indigestion and pulling and her back pain as pulling sensation. She reports associated dizziness, diaphoresis and abdominal pain. She states that her symptoms worsen with cough or certain positioning. She states she is not weak in any extremities. The patient denies any PMHx of MI or PE/DVT. She denies fever, vomiting, SOB, leg swelling, hemoptysis.  She denies pleuritic pain  Past Medical History  Diagnosis Date  . Headache(784.0)     migraines  . Multiple sclerosis   . Asthma as a child  . GERD (gastroesophageal reflux disease)   . Urinary urgency   . Anxiety   . Perforated bowel 2009  . Migraines   . Depression   . Chronic back pain   . Chronic pain   . Ovarian cyst    Past Surgical History  Procedure Laterality Date  . Extremity  cyst excision  1994    right leg  . 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 Colostomy closure  04/2007  . Scar revision  01/21/2011    Procedure: SCAR REVISION;  Surgeon: Hermelinda Dellen;  Location: [place];  Service: Plastics;  Laterality: N/A;  exploration of scar of abdomen and repair of defect  . Hernia repair    . Abdominal surgery    . Appendectomy     No family history on file. History  Substance Use Topics  . Smoking status: Current Some Day Smoker    Types: Cigarettes  . Smokeless tobacco: Never Used     Comment: quit smoking 10 days ago  . Alcohol Use: Yes     Comment: Rare   OB History   Grav Para Term Preterm Abortions TAB SAB Ect Mult Living                 Review of Systems  Constitutional: Positive for diaphoresis. Negative for fever.  Respiratory: Positive for cough. Negative for shortness of breath.   Cardiovascular: Positive for chest pain.  Gastrointestinal: Positive for abdominal pain. Negative for vomiting.  Musculoskeletal: Positive for back pain.  Neurological: Positive for dizziness.  All other systems reviewed and are negative.     Allergies  Amitriptyline; Baclofen; Cymbalta; Gabapentin; Monosodium glutamate; Vicodin; Alprazolam; Magnesium salicylate; Rizatriptan; Tizanidine; Adhesive; and Lamotrigine  Home Medications   Prior to Admission medications   Medication Sig Start Date End Date Taking? Authorizing Provider  carbamazepine (CARBATROL) 200 MG 12 hr capsule Take 200 mg by mouth 3 (three) times daily. 04/22/13 04/22/14  Historical Provider, MD  clindamycin (CLEOCIN) 150 MG capsule Take 150 mg by mouth 4 (four) times daily. 7 day course started 09/29/13    Historical Provider, MD  clonazePAM (KLONOPIN) 1 MG tablet Take 1 mg by mouth 2 (two) times daily as needed for anxiety.     Historical Provider, MD  divalproex (DEPAKOTE ER) 500 MG 24 hr tablet Take 500 mg by mouth daily. For depression    Historical Provider, MD   EPINEPHrine (EPIPEN) 0.3 mg/0.3 mL SOAJ injection Inject 0.3 mg into the muscle as needed (allergic reaction).     Historical Provider, MD  Fingolimod HCl (GILENYA) 0.5 MG CAPS Take 0.5 mg by mouth daily.     Historical Provider, MD  ibuprofen (ADVIL,MOTRIN) 600 MG tablet Take 600 mg by mouth daily as needed (pain).    Historical Provider, MD  loratadine (CLARITIN) 10 MG tablet Take 10 mg by mouth daily.     Historical Provider, MD  medroxyPROGESTERone (DEPO-PROVERA) 150 MG/ML injection Inject 150 mg into the muscle every 3 (three) months. Last injection mid September    Historical Provider, MD  metoCLOPramide (REGLAN) 10 MG tablet Take 1 tablet (10 mg total) by mouth every 6 (six) hours as needed for nausea (or headache). 09/16/35   Delora Fuel, MD  morphine (MSIR) 15 MG tablet Take 15 mg by mouth 4 (four) times daily.    Historical Provider, MD  neomycin-bacitracin-polymyxin (NEOSPORIN) OINT Apply 1 application topically 4 (four) times daily as needed for wound care (burn treatment).    Historical Provider, MD  omeprazole (PRILOSEC) 40 MG capsule Take 40 mg by mouth daily.     Historical Provider, MD  ondansetron (ZOFRAN) 8 MG tablet Take 8 mg by mouth every 6 (six) hours as needed for nausea or vomiting.     Historical Provider, MD  potassium chloride (K-DUR) 10 MEQ tablet Take 10 mEq by mouth 2 (two) times daily.    Historical Provider, MD  pregabalin (LYRICA) 200 MG capsule Take 600 mg by mouth at bedtime.    Historical Provider, MD  promethazine (PHENERGAN) 25 MG tablet Take 25 mg by mouth every 6 (six) hours as needed for nausea or vomiting.     Historical Provider, MD  propranolol (INDERAL) 10 MG tablet Take 10 mg by mouth 2 (two) times daily.    Historical Provider, MD  ranitidine (ZANTAC) 300 MG capsule Take 300 mg by mouth at bedtime.     Historical Provider, MD  topiramate (TOPAMAX) 100 MG tablet Take 100 mg by mouth 2 (two) times daily.     Historical Provider, MD   BP 149/96  Pulse 74   Temp(Src) 98.4 F (36.9 C) (Oral)  Resp 18  SpO2 100% Physical Exam  Nursing note and vitals reviewed.  CONSTITUTIONAL: Well developed/well nourished HEAD: Normocephalic/atraumatic EYES: EOMI/PERRL ENMT: Mucous membranes moist NECK: supple no meningeal signs SPINE:entire spine nontender, diffuse thoracic paraspinal tenderness  CV: S1/S2 noted, no murmurs/rubs/gallops noted LUNGS: Lungs are clear to auscultation bilaterally, no apparent distress ABDOMEN: soft, nontender, no rebound or guarding GU:no cva tenderness NEURO: Pt is awake/alert, moves all extremitiesx4 EXTREMITIES: pulses normal, full ROM, no edema or calf tenderness  SKIN: warm, color normal PSYCH: no abnormalities of mood noted  ED Course  Procedures  DIAGNOSTIC  STUDIES: Oxygen Saturation is 100% on RA, normal by my interpretation.    COORDINATION OF CARE: 10:57 AM Discussed treatment plan with pt at bedside and pt agreed to plan. Will order CXR and pain medication.    Pt well appearing, no distress She reports her main complaint is back pain - she had point tenderness to upper back.  No signs of trauma.  She had no focal neuro deficits (pt ambulatory) I have very low suspicion for PE (no hypoxia or tachycardia) and low suspicion for ACS Defer further workup for now No signs of acute abdominal issue at this time  Imaging Review Dg Chest 2 View  10/04/2013   CLINICAL DATA:  Chest pain.  Smoking history.  EXAM: CHEST  2 VIEW  COMPARISON:  09/16/2013.  FINDINGS: The lungs are clear without focal infiltrate, edema, pneumothorax or pleural effusion. The cardiopericardial silhouette is within normal limits for size. Imaged bony structures of the thorax are intact. Telemetry leads overlie the chest.  IMPRESSION: Stable.  No acute findings.   Electronically Signed   By: Misty Stanley M.D.   On: 10/04/2013 11:33     EKG Interpretation   Date/Time:  Monday October 04 2013 10:21:42 EDT Ventricular Rate:  76 PR  Interval:  179 QRS Duration: 89 QT Interval:  383 QTC Calculation: 431 R Axis:   80 Text Interpretation:  Sinus rhythm Non-specific ST-t changes ECG OTHERWISE  WITHIN NORMAL LIMITS No significant change since last tracing Confirmed by  Christy Gentles  MD, Elenore Rota ([id number]) on 10/04/2013 10:29:50 AM      MDM   Final diagnoses:  Chest pain, unspecified chest pain type  Thoracic back pain, unspecified back pain laterality    Nursing notes including past medical history and social history reviewed and considered in documentation xrays reviewed and considered   I personally performed the services described in this documentation, which was scribed in my presence. The recorded information has been reviewed and is accurate.       Sharyon Cable, MD 10/04/13 1534

## 2013-10-04 NOTE — ED Notes (Signed)
C/o central cp along with upper back pain that began last night.

## 2013-10-06 ENCOUNTER — Telehealth: Payer: Self-pay | Admitting: Diagnostic Neuroimaging

## 2013-10-06 ENCOUNTER — Ambulatory Visit (INDEPENDENT_AMBULATORY_CARE_PROVIDER_SITE_OTHER): Payer: Medicare Other | Admitting: Diagnostic Neuroimaging

## 2013-10-06 ENCOUNTER — Encounter: Payer: Self-pay | Admitting: Diagnostic Neuroimaging

## 2013-10-06 ENCOUNTER — Encounter (INDEPENDENT_AMBULATORY_CARE_PROVIDER_SITE_OTHER): Payer: Self-pay

## 2013-10-06 VITALS — Ht 65.5 in | Wt 205.8 lb

## 2013-10-06 DIAGNOSIS — G35 Multiple sclerosis: Secondary | ICD-10-CM

## 2013-10-06 DIAGNOSIS — G894 Chronic pain syndrome: Secondary | ICD-10-CM

## 2013-10-06 DIAGNOSIS — G43109 Migraine with aura, not intractable, without status migrainosus: Secondary | ICD-10-CM

## 2013-10-06 DIAGNOSIS — M792 Neuralgia and neuritis, unspecified: Secondary | ICD-10-CM

## 2013-10-06 DIAGNOSIS — IMO0002 Reserved for concepts with insufficient information to code with codable children: Secondary | ICD-10-CM

## 2013-10-06 MED ORDER — FROVATRIPTAN SUCCINATE 2.5 MG PO TABS: 2.5000 mg | ORAL_TABLET | ORAL | Status: DC | PRN

## 2013-10-06 NOTE — Telephone Encounter (Signed)
Patient has tried Imitrex as well as Maxalt in the past.  We will try to get the ins to grant an exception for Frova.  Pharmacy notified.

## 2013-10-06 NOTE — Progress Notes (Signed)
[facility]  PATIENT: Emily [name] DOB: 30-Jan-1980  REFERRING CLINICIAN: Ferraru HISTORY FROM: patient  REASON FOR VISIT: new consult   HISTORICAL  CHIEF COMPLAINT:  Chief Complaint  Patient presents with  . Headache  . Dizziness    HISTORY OF PRESENT ILLNESS:   33 year old redheaded female here for evaluation of multiple sclerosis and transfer of care.  2008 patient was living in [place] state, had right thigh pain and right eye blurred vision. Patient to the hospital emergency room, diagnosed with right optic neuritis. Patient had MRI of the brain which showed "20 active lesions" according to patient. Spinal tap showed abnormalities consistent with multiple sclerosis. Patient was treated with IV steroids and started on Copaxone soon after. Patient started on Copaxone for 2008 until 2010. Then she was switched to Betaseron until 2011. 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  Patient continues to have blurred vision, neuropathic pain in her feet and legs, bilateral lower 70 weakness, increased urinary urgency.  Patient also is migraines consisting of right-sided aching and throbbing sensation, once per week, associated with nausea, vomiting, photophobia, phonophobia, dizziness, seeing stars sparkling lights. Patient has tried Imitrex and Maxalt without relief. She is currently on Topamax, propranolol and Depakote for migraine prevention. No specific triggering factors. Other factors include depression, anxiety, insomnia, memory loss.  Patient previously evaluated by neurologist at [facility] (Dr. George Hugh) and [facility] (Dr. Macario Carls). Now looking to establish with a local neurologist near her home.   REVIEW OF SYSTEMS: Full 14 system review of systems performed and notable only for fatigue spinning sensation diarrhea blurred vision eye  pain easy bruising prance aching muscles allergy depression anxiety dizziness passing out insomnia memory loss headache numbness weakness dizziness passing out.  ALLERGIES: Allergies  Allergen Reactions  . Amitriptyline Hypertension and Other (See Comments)    hypertension  . Baclofen Hives and Shortness Of Breath  . Cymbalta [Duloxetine Hcl] Shortness Of Breath and Rash  . Gabapentin Shortness Of Breath and Rash  . Monosodium Glutamate Anaphylaxis  . Vicodin [Hydrocodone-Acetaminophen] Hives and Nausea And Vomiting    Projectile vomiting  . Alprazolam Other (See Comments)    Lethargy  . Magnesium Salicylate Hives and Itching  . Rizatriptan Nausea And Vomiting and Other (See Comments)    GI upset, Projectile vomiting  . Tizanidine Hives  . Adhesive [Tape] Other (See Comments)    Skin irritation  . Lamotrigine Rash    HOME MEDICATIONS: Outpatient Prescriptions Prior to Visit  Medication Sig Dispense Refill  . carbamazepine (CARBATROL) 200 MG 12 hr capsule Take 200 mg by mouth 3 (three) times daily.      . clonazePAM (KLONOPIN) 1 MG tablet Take 1 mg by mouth 2 (two) times daily as needed for anxiety.       . divalproex (DEPAKOTE ER) 500 MG 24 hr tablet Take 500 mg by mouth daily. For depression      . EPINEPHrine (EPIPEN) 0.3 mg/0.3 mL SOAJ injection Inject 0.3 mg into the muscle as needed (allergic reaction).       . Fingolimod HCl (GILENYA) 0.5 MG CAPS Take 0.5 mg by mouth daily.       Marland Kitchen ibuprofen (ADVIL,MOTRIN) 600 MG tablet Take 600 mg by mouth daily as needed (pain).      Marland Kitchen loratadine (CLARITIN) 10 MG tablet Take 10 mg by mouth daily.       Marland Kitchen  medroxyPROGESTERone (DEPO-PROVERA) 150 MG/ML injection Inject 150 mg into the muscle every 3 (three) months. Last injection mid September      . metoCLOPramide (REGLAN) 10 MG tablet Take 1 tablet (10 mg total) by mouth every 6 (six) hours as needed for nausea (or headache).  30 tablet  0  . morphine (MSIR) 15 MG tablet Take 15 mg by mouth  4 (four) times daily.      Marland Kitchen neomycin-bacitracin-polymyxin (NEOSPORIN) OINT Apply 1 application topically 4 (four) times daily as needed for wound care (burn treatment).      Marland Kitchen omeprazole (PRILOSEC) 40 MG capsule Take 40 mg by mouth daily.       . ondansetron (ZOFRAN) 8 MG tablet Take 8 mg by mouth every 6 (six) hours as needed for nausea or vomiting.       . potassium chloride (K-DUR) 10 MEQ tablet Take 10 mEq by mouth 2 (two) times daily.      . pregabalin (LYRICA) 200 MG capsule Take 600 mg by mouth at bedtime.      . promethazine (PHENERGAN) 25 MG tablet Take 25 mg by mouth every 6 (six) hours as needed for nausea or vomiting.       . propranolol (INDERAL) 10 MG tablet Take 10 mg by mouth 2 (two) times daily.      . ranitidine (ZANTAC) 300 MG capsule Take 300 mg by mouth at bedtime.       . topiramate (TOPAMAX) 100 MG tablet Take 100 mg by mouth 2 (two) times daily.       . clindamycin (CLEOCIN) 150 MG capsule Take 150 mg by mouth 4 (four) times daily. 7 day course started 09/29/13       No facility-administered medications prior to visit.    PAST MEDICAL HISTORY: Past Medical History  Diagnosis Date  . Headache(784.0)     migraines  . Multiple sclerosis   . Asthma as a child  . GERD (gastroesophageal reflux disease)   . Urinary urgency   . Anxiety   . Perforated bowel 2009  . Migraines   . Depression   . Chronic back pain   . Chronic pain   . Ovarian cyst     PAST SURGICAL HISTORY: Past Surgical History  Procedure Laterality Date  . Extremity cyst excision  1994    right leg  . 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 Colostomy closure  04/2007  . Scar revision  01/21/2011    Procedure: SCAR REVISION;  Surgeon: Hermelinda Dellen;  Location: [facility];  Service: Plastics;  Laterality: N/A;  exploration of scar of abdomen and repair of defect  . Hernia repair    . Abdominal surgery    . Appendectomy      FAMILY HISTORY: Family History  Problem  Relation Age of Onset  . Diabetes Mother   . Hypertension Mother   . Diabetes Father   . Hypertension Father     SOCIAL HISTORY:  History   Social History  . Marital Status: Significant Other    Spouse Name: N/A    Number of Children: 0  . Years of Education: College   Occupational History  .  Other    disability   Social History Main Topics  . Smoking status: Current Some Day Smoker -- 0.40 packs/day for 10 years    Types: Cigarettes  . Smokeless tobacco: Never Used     Comment: quit smoking 10 days ago  .  Alcohol Use: Yes     Comment: Rare  . Drug Use: No  . Sexual Activity: Yes    Birth Control/ Protection: Injection   Other Topics Concern  . Not on file   Social History Narrative   Patient lives at home with fiance.   Caffeine Use: 1 20oz soda daily     PHYSICAL EXAM  Filed Vitals:   10/06/13 0926  Height: 5' 5.5" (1.664 m)  Weight: 205 lb 12.8 oz (93.35 kg)    Not recorded    Body mass index is 33.71 kg/(m^2).  GENERAL EXAM: Patient is in no distress; well developed, nourished and groomed; neck is supple  CARDIOVASCULAR: Regular rate and rhythm, no murmurs, no carotid bruits  NEUROLOGIC: MENTAL STATUS: awake, alert, oriented to person, place and time, recent and remote memory intact, normal attention and concentration, language fluent, comprehension intact, naming intact, fund of knowledge appropriate CRANIAL NERVE: no papilledema on fundoscopic exam, PUPILS ARE DILATED AND MILDLY REACTIVE; MILD RIGHT EYE RELATIVE APD; RIGHT EYE MORE SENS TO LIGHT; visual fields full to confrontation, extraocular muscles intact, no nystagmus, facial sensation and strength symmetric, hearing intact, palate elevates symmetrically, uvula midline, shoulder shrug symmetric, tongue midline. MOTOR: normal bulk and tone, full strength in the BUE, BLE SENSORY: normal and symmetric to light touch, pinprick, temperature, vibration COORDINATION: finger-nose-finger, fine finger  movements normal REFLEXES: deep tendon reflexes present and symmetric GAIT/STATION: narrow based gait; able to walk on toes; UNSTEADY HEEL GAIT; romberg is negative    DIAGNOSTIC DATA (LABS, IMAGING, TESTING) - I reviewed patient records, labs, notes, testing and imaging myself where available.  Lab Results  Component Value Date   WBC 7.6 10/02/2013   HGB 14.5 10/02/2013   HCT 42.6 10/02/2013   MCV 89.7 10/02/2013   PLT 327 10/02/2013      Component Value Date/Time   NA 140 10/02/2013 1045   K 4.6 10/02/2013 1045   CL 105 10/02/2013 1045   CO2 25 10/02/2013 1045   GLUCOSE 102* 10/02/2013 1045   BUN 8 10/02/2013 1045   CREATININE 0.60 10/02/2013 1045   CALCIUM 8.6 10/02/2013 1045   PROT 6.5 09/18/2013 1409   ALBUMIN 3.3* 09/18/2013 1409   AST 13 09/18/2013 1409   ALT 17 09/18/2013 1409   ALKPHOS 75 09/18/2013 1409   BILITOT <0.2* 09/18/2013 1409   GFRNONAA >90 10/02/2013 1045   GFRAA >90 10/02/2013 1045   No results found for this basename: CHOL, HDL, LDLCALC, LDLDIRECT, TRIG, CHOLHDL   No results found for this basename: HGBA1C   No results found for this basename: VITAMINB12   No results found for this basename: TSH    I reviewed images myself and agree with interpretation. -VRP  10/02/13 MRI BRAIN - Minimal progression of cerebral white matter lesions, consistent with multiple sclerosis. No evidence of active demyelination.   ASSESSMENT AND PLAN  33 y.o. year old female here with multiple sclerosis since 2008. Also with migraine and chronic neuropathy pain.  PLAN: - continue gilenya - check MRI cervical and thoracic spine (with and without)  - continue topiramate, propranolol, depeakote for migraine prevention - trial of frovatripitan for migraine - narcotic mgmt by pain clinic or PCP  Meds ordered this encounter  Medications  . frovatriptan (FROVA) 2.5 MG tablet    Sig: Take 1 tablet (2.5 mg total) by mouth as needed for migraine. May repeat after 2 hours. Max of 2 tabs  in 24 hours.    Dispense:  10 tablet  Refill:  6   Return in about 3 months (around 01/05/2014).    Penni Bombard, MD 03/28/252, 27:06 AM Certified in Neurology, Neurophysiology and Neuroimaging  [facility] [address] [place], [place] [address] [phone]

## 2013-10-06 NOTE — Patient Instructions (Signed)
Try frovatriptan.  I will check MRI cervical and thoracic spine.   Continue gilenya.

## 2013-10-06 NOTE — Telephone Encounter (Signed)
[facility] is calling about a Rx they received for patient for Frova.  Patient's insurance will not cover Rx. Can another medication be called in. Imitrex will be covered. Please call and advise.

## 2013-10-07 NOTE — ED Provider Notes (Signed)
Medical screening examination/treatment/procedure(s) were performed by non-physician practitioner and as supervising physician I was immediately available for consultation/collaboration.   EKG Interpretation None      Rolland Porter, MD, Abram Sander   Janice Norrie, MD 10/07/13 [phone]

## 2013-10-08 ENCOUNTER — Encounter (HOSPITAL_COMMUNITY): Payer: Self-pay | Admitting: Emergency Medicine

## 2013-10-08 ENCOUNTER — Emergency Department (HOSPITAL_COMMUNITY)
Admission: EM | Admit: 2013-10-08 | Discharge: 2013-10-08 | Discharge status: Against Medical Advice | Payer: Medicare Other | Attending: Emergency Medicine | Admitting: Emergency Medicine

## 2013-10-08 DIAGNOSIS — J45909 Unspecified asthma, uncomplicated: Secondary | ICD-10-CM | POA: Diagnosis not present

## 2013-10-08 DIAGNOSIS — Z720 Tobacco use: Secondary | ICD-10-CM | POA: Insufficient documentation

## 2013-10-08 DIAGNOSIS — R1032 Left lower quadrant pain: Secondary | ICD-10-CM | POA: Diagnosis present

## 2013-10-08 DIAGNOSIS — R197 Diarrhea, unspecified: Secondary | ICD-10-CM | POA: Diagnosis not present

## 2013-10-08 DIAGNOSIS — R110 Nausea: Secondary | ICD-10-CM | POA: Diagnosis not present

## 2013-10-08 LAB — CBC WITH DIFFERENTIAL/PLATELET
Basophils Absolute: 0 10*3/uL (ref 0.0–0.1)
Basophils Relative: 0 % (ref 0–1)
Eosinophils Absolute: 0.1 10*3/uL (ref 0.0–0.7)
Eosinophils Relative: 1 % (ref 0–5)
HCT: 42 % (ref 36.0–46.0)
HEMOGLOBIN: 14.4 g/dL (ref 12.0–15.0)
Lymphocytes Relative: 32 % (ref 12–46)
Lymphs Abs: 2.1 10*3/uL (ref 0.7–4.0)
MCH: 30.8 pg (ref 26.0–34.0)
MCHC: 34.3 g/dL (ref 30.0–36.0)
MCV: 89.7 fL (ref 78.0–100.0)
Monocytes Absolute: 0.3 10*3/uL (ref 0.1–1.0)
Monocytes Relative: 5 % (ref 3–12)
NEUTROS ABS: 4.2 10*3/uL (ref 1.7–7.7)
NEUTROS PCT: 62 % (ref 43–77)
Platelets: 367 10*3/uL (ref 150–400)
RBC: 4.68 MIL/uL (ref 3.87–5.11)
RDW: 13.4 % (ref 11.5–15.5)
WBC: 6.7 10*3/uL (ref 4.0–10.5)

## 2013-10-08 LAB — COMPREHENSIVE METABOLIC PANEL
ALBUMIN: 3.7 g/dL (ref 3.5–5.2)
ALK PHOS: 72 U/L (ref 39–117)
ALT: 20 U/L (ref 0–35)
ANION GAP: 12 (ref 5–15)
AST: 16 U/L (ref 0–37)
BILIRUBIN TOTAL: 0.2 mg/dL — AB (ref 0.3–1.2)
BUN: 13 mg/dL (ref 6–23)
CHLORIDE: 104 meq/L (ref 96–112)
CO2: 25 mEq/L (ref 19–32)
Calcium: 8.7 mg/dL (ref 8.4–10.5)
Creatinine, Ser: 0.77 mg/dL (ref 0.50–1.10)
GFR calc Af Amer: >90 mL/min (ref >90)
GFR calc non Af Amer: >90 mL/min (ref >90)
Glucose, Bld: 78 mg/dL (ref 70–99)
POTASSIUM: 4.3 meq/L (ref 3.7–5.3)
Sodium: 141 mEq/L (ref 137–147)
Total Protein: 7.4 g/dL (ref 6.0–8.3)

## 2013-10-08 NOTE — ED Notes (Signed)
LLQ pain x 2 days, Nausea , no vomiting, Has had diarrhea x2 today.

## 2013-10-09 ENCOUNTER — Encounter (HOSPITAL_COMMUNITY): Payer: Self-pay | Admitting: Emergency Medicine

## 2013-10-09 ENCOUNTER — Emergency Department (HOSPITAL_COMMUNITY)
Admission: EM | Admit: 2013-10-09 | Discharge: 2013-10-09 | Discharge status: Against Medical Advice | Payer: Medicare Other | Attending: Emergency Medicine | Admitting: Emergency Medicine

## 2013-10-09 DIAGNOSIS — K219 Gastro-esophageal reflux disease without esophagitis: Secondary | ICD-10-CM | POA: Insufficient documentation

## 2013-10-09 DIAGNOSIS — G8929 Other chronic pain: Secondary | ICD-10-CM | POA: Diagnosis not present

## 2013-10-09 DIAGNOSIS — Z8669 Personal history of other diseases of the nervous system and sense organs: Secondary | ICD-10-CM

## 2013-10-09 DIAGNOSIS — Z791 Long term (current) use of non-steroidal anti-inflammatories (NSAID): Secondary | ICD-10-CM | POA: Insufficient documentation

## 2013-10-09 DIAGNOSIS — F419 Anxiety disorder, unspecified: Secondary | ICD-10-CM | POA: Diagnosis not present

## 2013-10-09 DIAGNOSIS — R1032 Left lower quadrant pain: Secondary | ICD-10-CM | POA: Diagnosis present

## 2013-10-09 DIAGNOSIS — Z79891 Long term (current) use of opiate analgesic: Secondary | ICD-10-CM | POA: Insufficient documentation

## 2013-10-09 DIAGNOSIS — G43909 Migraine, unspecified, not intractable, without status migrainosus: Secondary | ICD-10-CM | POA: Diagnosis not present

## 2013-10-09 DIAGNOSIS — J45909 Unspecified asthma, uncomplicated: Secondary | ICD-10-CM | POA: Diagnosis not present

## 2013-10-09 DIAGNOSIS — Z720 Tobacco use: Secondary | ICD-10-CM | POA: Diagnosis not present

## 2013-10-09 DIAGNOSIS — Z8742 Personal history of other diseases of the female genital tract: Secondary | ICD-10-CM | POA: Diagnosis not present

## 2013-10-09 DIAGNOSIS — G35 Multiple sclerosis: Secondary | ICD-10-CM

## 2013-10-09 DIAGNOSIS — F329 Major depressive disorder, single episode, unspecified: Secondary | ICD-10-CM | POA: Insufficient documentation

## 2013-10-09 DIAGNOSIS — Z79899 Other long term (current) drug therapy: Secondary | ICD-10-CM | POA: Diagnosis not present

## 2013-10-09 MED ORDER — TRAMADOL HCL 50 MG PO TABS: 50.0000 mg | ORAL_TABLET | Freq: Once | ORAL | Status: DC

## 2013-10-09 NOTE — ED Notes (Signed)
Pt c/o lower abd pain

## 2013-10-09 NOTE — ED Provider Notes (Signed)
CSN: [id number]     Arrival date & time 10/09/13  0159 History   First MD Initiated Contact with Patient 10/09/13 [phone]     Chief Complaint  Patient presents with  . Abdominal Pain     (Consider location/radiation/quality/duration/timing/severity/associated sxs/prior Treatment) HPI Comments: Patient is a 33 year old female with history of multiple sclerosis, migraines, chronic back pain, anxiety. She presents today with complaints of lower abdominal pain for the past 2 days. She denies any vaginal bleeding or discharge. She denies any fevers. She denies any bowel or bladder complaints.  Patient is a 33 y.o. female presenting with abdominal pain. The history is provided by the patient.  Abdominal Pain Pain location:  LLQ Pain quality: cramping   Pain radiates to:  Does not radiate Pain severity:  Severe Onset quality:  Sudden Duration:  2 days Timing:  Constant Progression:  Worsening   Past Medical History  Diagnosis Date  . Headache(784.0)     migraines  . Multiple sclerosis   . Asthma as a child  . GERD (gastroesophageal reflux disease)   . Urinary urgency   . Anxiety   . Perforated bowel 2009  . Migraines   . Depression   . Chronic back pain   . Chronic pain   . Ovarian cyst    Past Surgical History  Procedure Laterality Date  . Extremity cyst excision  1994    right leg  . 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 Colostomy closure  04/2007  . Scar revision  01/21/2011    Procedure: SCAR REVISION;  Surgeon: Hermelinda Dellen;  Location: [facility];  Service: Plastics;  Laterality: N/A;  exploration of scar of abdomen and repair of defect  . Hernia repair    . Abdominal surgery    . Appendectomy     Family History  Problem Relation Age of Onset  . Diabetes Mother   . Hypertension Mother   . Diabetes Father   . Hypertension Father    History  Substance Use Topics  . Smoking status: Current Some Day Smoker -- 0.40 packs/day for 10 years     Types: Cigarettes  . Smokeless tobacco: Never Used     Comment: quit smoking 10 days ago  . Alcohol Use: Yes     Comment: Rare   OB History   Grav Para Term Preterm Abortions TAB SAB Ect Mult Living                 Review of Systems  Gastrointestinal: Positive for abdominal pain.  All other systems reviewed and are negative.     Allergies  Amitriptyline; Baclofen; Cymbalta; Gabapentin; Monosodium glutamate; Vicodin; Alprazolam; Magnesium salicylate; Rizatriptan; Tizanidine; Toradol; Adhesive; and Lamotrigine  Home Medications   Prior to Admission medications   Medication Sig Start Date End Date Taking? Authorizing Provider  busPIRone (BUSPAR) 5 MG tablet Take 5 mg by mouth 3 (three) times daily.   Yes Historical Provider, MD  carbamazepine (CARBATROL) 200 MG 12 hr capsule Take 200 mg by mouth 3 (three) times daily. 04/22/13 04/22/14 Yes Historical Provider, MD  clonazePAM (KLONOPIN) 1 MG tablet Take 1 mg by mouth 2 (two) times daily as needed for anxiety.    Yes Historical Provider, MD  cyclobenzaprine (FLEXERIL) 10 MG tablet Take 10 mg by mouth 3 (three) times daily as needed for muscle spasms.   Yes Historical Provider, MD  divalproex (DEPAKOTE ER) 500 MG 24 hr tablet Take 500 mg by  mouth daily. For depression   Yes Historical Provider, MD  EPINEPHrine (EPIPEN) 0.3 mg/0.3 mL SOAJ injection Inject 0.3 mg into the muscle as needed (allergic reaction).    Yes Historical Provider, MD  Fingolimod HCl (GILENYA) 0.5 MG CAPS Take 0.5 mg by mouth daily.    Yes Historical Provider, MD  ibuprofen (ADVIL,MOTRIN) 600 MG tablet Take 600 mg by mouth daily as needed (pain).   Yes Historical Provider, MD  loratadine (CLARITIN) 10 MG tablet Take 10 mg by mouth daily.    Yes Historical Provider, MD  medroxyPROGESTERone (DEPO-PROVERA) 150 MG/ML injection Inject 150 mg into the muscle every 3 (three) months. Last injection mid September   Yes Historical Provider, MD  metoCLOPramide (REGLAN) 10 MG  tablet Take 1 tablet (10 mg total) by mouth every 6 (six) hours as needed for nausea (or headache). 01/11/38  Yes Delora Fuel, MD  morphine (MSIR) 15 MG tablet Take 15 mg by mouth 4 (four) times daily.   Yes Historical Provider, MD  neomycin-bacitracin-polymyxin (NEOSPORIN) OINT Apply 1 application topically 4 (four) times daily as needed for wound care (burn treatment).   Yes Historical Provider, MD  omeprazole (PRILOSEC) 40 MG capsule Take 40 mg by mouth daily.    Yes Historical Provider, MD  ondansetron (ZOFRAN) 8 MG tablet Take 8 mg by mouth every 6 (six) hours as needed for nausea or vomiting.    Yes Historical Provider, MD  potassium chloride (K-DUR) 10 MEQ tablet Take 10 mEq by mouth 2 (two) times daily.   Yes Historical Provider, MD  pregabalin (LYRICA) 200 MG capsule Take 600 mg by mouth at bedtime.   Yes Historical Provider, MD  promethazine (PHENERGAN) 25 MG tablet Take 25 mg by mouth every 6 (six) hours as needed for nausea or vomiting.    Yes Historical Provider, MD  propranolol (INDERAL) 10 MG tablet Take 10 mg by mouth 2 (two) times daily.   Yes Historical Provider, MD  ranitidine (ZANTAC) 300 MG capsule Take 300 mg by mouth at bedtime.    Yes Historical Provider, MD  topiramate (TOPAMAX) 100 MG tablet Take 100 mg by mouth 2 (two) times daily.    Yes Historical Provider, MD  venlafaxine (EFFEXOR) 75 MG tablet Take 75 mg by mouth 3 (three) times daily with meals.   Yes Historical Provider, MD  frovatriptan (FROVA) 2.5 MG tablet Take 1 tablet (2.5 mg total) by mouth as needed for migraine. May repeat after 2 hours. Max of 2 tabs in 24 hours. 10/06/13   Penni Bombard, MD   BP 143/88  Pulse 100  Temp(Src) 98.1 F (36.7 C) (Oral)  Resp 18  Ht 5\' 5"  (1.651 m)  Wt 200 lb (90.719 kg)  BMI 33.28 kg/m2  SpO2 100% Physical Exam  Nursing note and vitals reviewed. Constitutional: She is oriented to person, place, and time. She appears well-developed and well-nourished. No distress.   HENT:  Head: Normocephalic and atraumatic.  Neck: Normal range of motion. Neck supple.  Cardiovascular: Normal rate and regular rhythm.  Exam reveals no gallop and no friction rub.   No murmur heard. Pulmonary/Chest: Effort normal and breath sounds normal. No respiratory distress. She has no wheezes.  Abdominal: Soft. Bowel sounds are normal. She exhibits no distension. There is tenderness. There is no rebound and no guarding.  There is tenderness to palpation in the left lower quadrant. There is no rebound and no guarding.  Musculoskeletal: Normal range of motion.  Neurological: She is alert and oriented  to person, place, and time.  Skin: Skin is warm and dry. She is not diaphoretic.    ED Course  Procedures (including critical care time) Labs Review Labs Reviewed  CBC WITH DIFFERENTIAL  BASIC METABOLIC PANEL  URINALYSIS, ROUTINE W REFLEX MICROSCOPIC  PREGNANCY, URINE    Imaging Review No results found.   EKG Interpretation None      MDM   Final diagnoses:  None    Patient presents here with complaints of lower abdominal pain. She has a history of multiple sclerosis and psychiatric issues, and is well known to the emergency department. (This is her 19th visit in the past 30 days to a [facility] emergency department for various complaints.) My intention was to order laboratory studies and urinalysis to further investigate the cause of her discomfort, and treat her pain.   Prior to the initiation of the tests and the administration of medications, I attempted to have a discussion with her regarding the frequency of her visits to the emergency department, and emphasized the importance of continuity of care with a chronic condition such as MS.  I educated her that seeing multiple physicians in different ER's put her at risk for receiving redundant medications and imaging studies that could be potentially harmful to her well-being.  I advised her of my intention to screen her for  an emergent condition, and if all was well, have her follow up with her neurologist to discuss her situation.    She informed me in no uncertain terms that I offended her and made her feel unwelcome.  I apologized and told her my intention was not to offend her, but to look out for her safety.  By this time, she was quite irrational, hyperventilating, and would not listen to my explanation.  After I left the room, she eloped from the Emergency Department, cursing as she left.  In conclusion, this patient became angry when I attempt to discuss the frequency of her ED visits, then eloped.  My intention was not to offend, but to educate about he potential dangers of redundant medications and radiation.  I attempted to apologize that my advice was taken as an insult, however she was quite irrational and would not listen to what I had to say.  She ambulated from the ED briskly without difficulty or limp.    [name], MD 10/09/13 [phone]

## 2013-10-09 NOTE — ED Notes (Signed)
Pt and spouse disagree with EDP Delo recommendation and left without medical treatment, pt refused to sign discharge.

## 2013-10-11 ENCOUNTER — Telehealth: Payer: Self-pay | Admitting: Diagnostic Neuroimaging

## 2013-10-11 NOTE — Telephone Encounter (Signed)
Patient calling to state that she has some new symptoms that started today--she has a lot of pain in her legs as well as a lot of spasms, please return call and advise.

## 2013-10-12 NOTE — Telephone Encounter (Signed)
Patient states both her legs are having spasms and hurting a lot pain level 8. Patient is having problems walking as well.Patient states when she walks legs hurts worse. [phone].

## 2013-10-12 NOTE — Telephone Encounter (Signed)
See phone note new patient 10/06/13.

## 2013-10-12 NOTE — Telephone Encounter (Signed)
Called patient left her a voice mail asking her to call us back with more information so we can get her the help she needs.

## 2013-10-13 NOTE — Telephone Encounter (Signed)
Patient calling to state that she is claustrophobic and has an MRI scheduled Friday 10/9, and request something to relax her while she is having the MRI. Patient's pharmacy is [facility] in [place], [place].

## 2013-10-14 NOTE — Telephone Encounter (Signed)
Patient calling back about medication, per Dr Leta Baptist okay for patient to have Xanax, patient lives in [place] and can't come pick up today, Mri is scheduled for tomorrow morning at [facility]. Please call medication into [facility] in [place], [place]

## 2013-10-14 NOTE — Telephone Encounter (Signed)
I called the pharmacy.  Spoke with Lanny Hurst.  Gave verbal order for meds to take prior to MRI.  I called the patient back, got no answer.  Left message.

## 2013-10-15 ENCOUNTER — Telehealth: Payer: Self-pay | Admitting: Diagnostic Neuroimaging

## 2013-10-15 ENCOUNTER — Ambulatory Visit
Admission: RE | Admit: 2013-10-15 | Discharge: 2013-10-15 | Disposition: A | Discharge status: Home / Self Care | Payer: Medicare Other | Source: Ambulatory Visit | Attending: Diagnostic Neuroimaging | Admitting: Diagnostic Neuroimaging

## 2013-10-15 DIAGNOSIS — G35 Multiple sclerosis: Secondary | ICD-10-CM

## 2013-10-15 DIAGNOSIS — G894 Chronic pain syndrome: Secondary | ICD-10-CM

## 2013-10-15 DIAGNOSIS — M792 Neuralgia and neuritis, unspecified: Secondary | ICD-10-CM

## 2013-10-15 MED ORDER — GADOBENATE DIMEGLUMINE 529 MG/ML IV SOLN
20.0000 mL | Freq: Once | INTRAVENOUS | Status: AC | PRN
  Administered 2013-10-15: 20 mL via INTRAVENOUS

## 2013-10-15 NOTE — Telephone Encounter (Signed)
Patient calling to state that she is in between transitioning to a different pain clinic and wants to know if Dr. Leta Baptist can prescribe her morphine sulfate until she can find a new pain clinic. Please return call and advise.

## 2013-10-15 NOTE — Telephone Encounter (Signed)
OV from 09/30 says: - narcotic mgmt by pain clinic or PCP I called the patient back.  Advised Dr Leta Baptist does not prescribe Narcotic medications.  Recommended she contact previous prescribing physician or PCP.  She said neither of them will prescribe Morphine for her, but she will consult them for further recommendations.  Will call us back if needed.

## 2013-10-17 ENCOUNTER — Encounter (HOSPITAL_COMMUNITY): Payer: Self-pay | Admitting: Emergency Medicine

## 2013-10-17 ENCOUNTER — Emergency Department (HOSPITAL_COMMUNITY)
Admission: EM | Admit: 2013-10-17 | Discharge: 2013-10-17 | Disposition: A | Discharge status: Home / Self Care | Payer: Medicare Other | Attending: Emergency Medicine | Admitting: Emergency Medicine

## 2013-10-17 DIAGNOSIS — G35 Multiple sclerosis: Secondary | ICD-10-CM | POA: Diagnosis not present

## 2013-10-17 DIAGNOSIS — Z8742 Personal history of other diseases of the female genital tract: Secondary | ICD-10-CM | POA: Insufficient documentation

## 2013-10-17 DIAGNOSIS — Z79899 Other long term (current) drug therapy: Secondary | ICD-10-CM | POA: Diagnosis not present

## 2013-10-17 DIAGNOSIS — G8929 Other chronic pain: Secondary | ICD-10-CM | POA: Diagnosis not present

## 2013-10-17 DIAGNOSIS — Z8739 Personal history of other diseases of the musculoskeletal system and connective tissue: Secondary | ICD-10-CM | POA: Diagnosis not present

## 2013-10-17 DIAGNOSIS — Z720 Tobacco use: Secondary | ICD-10-CM | POA: Insufficient documentation

## 2013-10-17 DIAGNOSIS — G43909 Migraine, unspecified, not intractable, without status migrainosus: Secondary | ICD-10-CM | POA: Insufficient documentation

## 2013-10-17 DIAGNOSIS — J45909 Unspecified asthma, uncomplicated: Secondary | ICD-10-CM | POA: Insufficient documentation

## 2013-10-17 DIAGNOSIS — R51 Headache: Secondary | ICD-10-CM

## 2013-10-17 DIAGNOSIS — F419 Anxiety disorder, unspecified: Secondary | ICD-10-CM | POA: Diagnosis not present

## 2013-10-17 DIAGNOSIS — F329 Major depressive disorder, single episode, unspecified: Secondary | ICD-10-CM | POA: Insufficient documentation

## 2013-10-17 DIAGNOSIS — R519 Headache, unspecified: Secondary | ICD-10-CM

## 2013-10-17 DIAGNOSIS — K219 Gastro-esophageal reflux disease without esophagitis: Secondary | ICD-10-CM | POA: Diagnosis not present

## 2013-10-17 MED ORDER — DIPHENHYDRAMINE HCL 50 MG/ML IJ SOLN
25.0000 mg | Freq: Once | INTRAMUSCULAR | Status: AC
  Administered 2013-10-17: 25 mg via INTRAVENOUS
  Filled 2013-10-17: qty 1

## 2013-10-17 MED ORDER — PROCHLORPERAZINE EDISYLATE 5 MG/ML IJ SOLN
10.0000 mg | Freq: Four times a day (QID) | INTRAMUSCULAR | Status: DC | PRN
  Administered 2013-10-17: 10 mg via INTRAVENOUS
  Filled 2013-10-17: qty 2

## 2013-10-17 MED ORDER — HYDROMORPHONE HCL 1 MG/ML IJ SOLN
1.0000 mg | Freq: Once | INTRAMUSCULAR | Status: AC
  Administered 2013-10-17: 1 mg via INTRAVENOUS
  Filled 2013-10-17: qty 1

## 2013-10-17 MED ORDER — SODIUM CHLORIDE 0.9 % IV BOLUS (SEPSIS)
1000.0000 mL | Freq: Once | INTRAVENOUS | Status: AC
  Administered 2013-10-17: 1000 mL via INTRAVENOUS

## 2013-10-17 NOTE — ED Provider Notes (Signed)
CSN: [id number]     Arrival date & time 10/17/13  1761 History   First MD Initiated Contact with Patient 10/17/13 [phone]     Chief Complaint  Patient presents with  . Headache     HPI Patient presents with worsening headache over the past 2 days.  She has a long-standing history of recurrent headaches.  She has a history of multiple sclerosis.  She is now 27 visits in the emergency department in the past 6 months for similar complaints.  She is on morphine at home for chronic pain.  She states she's tried this without improvement in her symptoms.  She's tried Benadryl.  No change in her vision.  No fevers or chills.  No neck stiffness.  No weakness of the arms or legs.  Pain is moderate to severe in severity this time.  No recent injury or trauma.   Past Medical History  Diagnosis Date  . Headache(784.0)     migraines  . Multiple sclerosis   . Asthma as a child  . GERD (gastroesophageal reflux disease)   . Urinary urgency   . Anxiety   . Perforated bowel 2009  . Migraines   . Depression   . Chronic back pain   . Chronic pain   . Ovarian cyst    Past Surgical History  Procedure Laterality Date  . Extremity cyst excision  1994    right leg  . 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 Colostomy closure  04/2007  . Scar revision  01/21/2011    Procedure: SCAR REVISION;  Surgeon: Hermelinda Dellen;  Location: [facility];  Service: Plastics;  Laterality: N/A;  exploration of scar of abdomen and repair of defect  . Hernia repair    . Abdominal surgery    . Appendectomy     Family History  Problem Relation Age of Onset  . Diabetes Mother   . Hypertension Mother   . Diabetes Father   . Hypertension Father    History  Substance Use Topics  . Smoking status: Current Some Day Smoker -- 0.40 packs/day for 10 years    Types: Cigarettes  . Smokeless tobacco: Never Used     Comment: quit smoking 10 days ago  . Alcohol Use: Yes     Comment: Rare   OB History    Grav Para Term Preterm Abortions TAB SAB Ect Mult Living                 Review of Systems  All other systems reviewed and are negative.     Allergies  Amitriptyline; Baclofen; Cymbalta; Gabapentin; Monosodium glutamate; Vicodin; Alprazolam; Magnesium salicylate; Rizatriptan; Tizanidine; Toradol; Adhesive; and Lamotrigine  Home Medications   Prior to Admission medications   Medication Sig Start Date End Date Taking? Authorizing Provider  busPIRone (BUSPAR) 5 MG tablet Take 5 mg by mouth 3 (three) times daily.   Yes Historical Provider, MD  carbamazepine (CARBATROL) 200 MG 12 hr capsule Take 200 mg by mouth 3 (three) times daily. 04/22/13 04/22/14 Yes Historical Provider, MD  clonazePAM (KLONOPIN) 1 MG tablet Take 1 mg by mouth 2 (two) times daily as needed for anxiety.    Yes Historical Provider, MD  cyclobenzaprine (FLEXERIL) 10 MG tablet Take 10 mg by mouth 3 (three) times daily as needed for muscle spasms.   Yes Historical Provider, MD  diphenhydrAMINE (BENADRYL) 25 MG tablet Take 50 mg by mouth once.   Yes Historical Provider, MD  divalproex (DEPAKOTE ER) 500 MG 24 hr tablet Take 500 mg by mouth daily. For depression   Yes Historical Provider, MD  Fingolimod HCl (GILENYA) 0.5 MG CAPS Take 0.5 mg by mouth daily.    Yes Historical Provider, MD  ibuprofen (ADVIL,MOTRIN) 600 MG tablet Take 600 mg by mouth daily as needed (pain).   Yes Historical Provider, MD  loratadine (CLARITIN) 10 MG tablet Take 10 mg by mouth daily.    Yes Historical Provider, MD  morphine (MSIR) 15 MG tablet Take 15 mg by mouth 4 (four) times daily.   Yes Historical Provider, MD  omeprazole (PRILOSEC) 40 MG capsule Take 40 mg by mouth daily.    Yes Historical Provider, MD  ondansetron (ZOFRAN) 8 MG tablet Take 8 mg by mouth every 6 (six) hours as needed for nausea or vomiting.    Yes Historical Provider, MD  potassium chloride (K-DUR) 10 MEQ tablet Take 10 mEq by mouth 2 (two) times daily.   Yes Historical Provider,  MD  pregabalin (LYRICA) 200 MG capsule Take 600 mg by mouth at bedtime.   Yes Historical Provider, MD  promethazine (PHENERGAN) 25 MG tablet Take 25 mg by mouth every 6 (six) hours as needed for nausea or vomiting.    Yes Historical Provider, MD  propranolol (INDERAL) 10 MG tablet Take 10 mg by mouth 2 (two) times daily.   Yes Historical Provider, MD  ranitidine (ZANTAC) 300 MG capsule Take 300 mg by mouth at bedtime.    Yes Historical Provider, MD  topiramate (TOPAMAX) 100 MG tablet Take 100 mg by mouth 2 (two) times daily.    Yes Historical Provider, MD  venlafaxine (EFFEXOR) 75 MG tablet Take 75 mg by mouth 3 (three) times daily with meals.   Yes Historical Provider, MD  EPINEPHrine (EPIPEN) 0.3 mg/0.3 mL SOAJ injection Inject 0.3 mg into the muscle as needed (allergic reaction).     Historical Provider, MD  medroxyPROGESTERone (DEPO-PROVERA) 150 MG/ML injection Inject 150 mg into the muscle every 3 (three) months. Last injection mid September    Historical Provider, MD   BP 139/78  Pulse 88  Temp(Src) 98.4 F (36.9 C) (Oral)  Resp 16  SpO2 96% Physical Exam  Nursing note and vitals reviewed. Constitutional: She is oriented to person, place, and time. She appears well-developed and well-nourished. No distress.  HENT:  Head: Normocephalic and atraumatic.  Eyes: EOM are normal. Pupils are equal, round, and reactive to light.  Neck: Normal range of motion.  Cardiovascular: Normal rate, regular rhythm and normal heart sounds.   Pulmonary/Chest: Effort normal and breath sounds normal.  Abdominal: Soft. She exhibits no distension. There is no tenderness.  Musculoskeletal: Normal range of motion.  Neurological: She is alert and oriented to person, place, and time.  5/5 strength in major muscle groups of  bilateral upper and lower extremities. Speech normal. No facial asymetry.   Skin: Skin is warm and dry.  Psychiatric: She has a normal mood and affect. Judgment normal.    ED Course   Procedures (including critical care time) Labs Review Labs Reviewed - No data to display  Imaging Review No results found.   EKG Interpretation None      MDM   Final diagnoses:  Headache, unspecified headache type    Typical migraine headache for the pt. Non focal neuro exam. No recent head trauma. No fever. Doubt meningitis. Doubt intracranial bleed. Doubt normal pressure hydrocephalus. No indication for imaging. Will treat with migraine cocktail and reevaluate  10:36 AM Patient feeling much better.  Discharge home in good condition.  Outpatient PCP and neurology followup.  She understands to return to the ER for new or worsening symptoms    [name], MD 10/17/13 1036

## 2013-10-17 NOTE — ED Notes (Signed)
Pt reports that she started having a migraine about 2 days ago that has gotten worse. Pt reports that she has tried benadryl and morphine 15mg  at home without much relief. Denies any vision changes, but reports light sensitivity.

## 2013-10-18 ENCOUNTER — Telehealth: Payer: Self-pay

## 2013-10-18 NOTE — Telephone Encounter (Signed)
[place] (Medicare Part D) notified us they have approved our request for coverage on [employer] effective until 10/19/2014  Ref ID: [id number] Event # [id number]

## 2013-10-24 ENCOUNTER — Emergency Department (HOSPITAL_COMMUNITY): Payer: Medicare Other

## 2013-10-24 ENCOUNTER — Encounter (HOSPITAL_COMMUNITY): Payer: Self-pay | Admitting: Emergency Medicine

## 2013-10-24 ENCOUNTER — Emergency Department (HOSPITAL_COMMUNITY)
Admission: EM | Admit: 2013-10-24 | Discharge: 2013-10-24 | Disposition: A | Discharge status: Home / Self Care | Payer: Medicare Other | Attending: Emergency Medicine | Admitting: Emergency Medicine

## 2013-10-24 DIAGNOSIS — G8929 Other chronic pain: Secondary | ICD-10-CM | POA: Diagnosis not present

## 2013-10-24 DIAGNOSIS — F329 Major depressive disorder, single episode, unspecified: Secondary | ICD-10-CM | POA: Insufficient documentation

## 2013-10-24 DIAGNOSIS — K219 Gastro-esophageal reflux disease without esophagitis: Secondary | ICD-10-CM | POA: Insufficient documentation

## 2013-10-24 DIAGNOSIS — Z79899 Other long term (current) drug therapy: Secondary | ICD-10-CM | POA: Insufficient documentation

## 2013-10-24 DIAGNOSIS — J45909 Unspecified asthma, uncomplicated: Secondary | ICD-10-CM | POA: Diagnosis not present

## 2013-10-24 DIAGNOSIS — K209 Esophagitis, unspecified without bleeding: Secondary | ICD-10-CM

## 2013-10-24 DIAGNOSIS — F419 Anxiety disorder, unspecified: Secondary | ICD-10-CM | POA: Diagnosis not present

## 2013-10-24 DIAGNOSIS — R079 Chest pain, unspecified: Secondary | ICD-10-CM | POA: Diagnosis present

## 2013-10-24 MED ORDER — HYDROXYZINE HCL 25 MG PO TABS
50.0000 mg | ORAL_TABLET | Freq: Once | ORAL | Status: AC
  Administered 2013-10-24: 50 mg via ORAL
  Filled 2013-10-24: qty 2

## 2013-10-24 MED ORDER — GI COCKTAIL ~~LOC~~
30.0000 mL | Freq: Once | ORAL | Status: AC
  Administered 2013-10-24: 30 mL via ORAL
  Filled 2013-10-24: qty 30

## 2013-10-24 MED ORDER — MAGIC MOUTHWASH W/LIDOCAINE: 5.0000 mL | Freq: Three times a day (TID) | ORAL | Status: DC | PRN

## 2013-10-24 NOTE — ED Notes (Addendum)
Patient states that she started having chest pain around midnight last night. States that it is burning and crushing nature. States that she has a productive cough also and hx of GERD.

## 2013-10-24 NOTE — Discharge Instructions (Signed)
Esophagitis Esophagitis is inflammation of the esophagus. It can involve swelling, soreness, and pain in the esophagus. This condition can make it difficult and painful to swallow. CAUSES  Most causes of esophagitis are not serious. Many different factors can cause esophagitis, including:  Gastroesophageal reflux disease (GERD). This is when acid from your stomach flows up into the esophagus.  Recurrent vomiting.  An allergic-type reaction.  Certain medicines, especially those that come in large pills.  Ingestion of harmful chemicals, such as household cleaning products.  Heavy alcohol use.  An infection of the esophagus.  Radiation treatment for cancer.  Certain diseases such as sarcoidosis, Crohn's disease, and scleroderma. These diseases may cause recurrent esophagitis. SYMPTOMS   Trouble swallowing.  Painful swallowing.  Chest pain.  Difficulty breathing.  Nausea.  Vomiting.  Abdominal pain. DIAGNOSIS  Your caregiver will take your history and do a physical exam. Depending upon what your caregiver finds, certain tests may also be done, including:  Barium X-ray. You will drink a solution that coats the esophagus, and X-rays will be taken.  Endoscopy. A lighted tube is put down the esophagus so your caregiver can examine the area.  Allergy tests. These can sometimes be arranged through follow-up visits. TREATMENT  Treatment will depend on the cause of your esophagitis. In some cases, steroids or other medicines may be given to help relieve your symptoms or to treat the underlying cause of your condition. Medicines that may be recommended include:  Viscous lidocaine, to soothe the esophagus.  Antacids.  Acid reducers.  Proton pump inhibitors.  Antiviral medicines for certain viral infections of the esophagus.  Antifungal medicines for certain fungal infections of the esophagus.  Antibiotic medicines, depending on the cause of the esophagitis. HOME CARE  INSTRUCTIONS   Avoid foods and drinks that seem to make your symptoms worse.  Eat small, frequent meals instead of large meals.  Avoid eating for the 3 hours prior to your bedtime.  If you have trouble taking pills, use a pill splitter to decrease the size and likelihood of the pill getting stuck or injuring the esophagus on the way down. Drinking water after taking a pill also helps.  Stop smoking if you smoke.  Maintain a healthy weight.  Wear loose-fitting clothing. Do not wear anything tight around your waist that causes pressure on your stomach.  Raise the head of your bed 6 to 8 inches with wood blocks to help you sleep. Extra pillows will not help.  Only take over-the-counter or prescription medicines as directed by your caregiver. SEEK IMMEDIATE MEDICAL CARE IF:  You have severe chest pain that radiates into your arm, neck, or jaw.  You feel sweaty, dizzy, or lightheaded.  You have shortness of breath.  You vomit blood.  You have difficulty or pain with swallowing.  You have bloody or black, tarry stools.  You have a fever.  You have a burning sensation in the chest more than 3 times a week for more than 2 weeks.  You cannot swallow, drink, or eat.  You drool because you cannot swallow your saliva. MAKE SURE YOU:  Understand these instructions.  Will watch your condition.  Will get help right away if you are not doing well or get worse. Document Released: 02/01/2004 Document Revised: 03/18/2011 Document Reviewed: 08/24/2010 Exit[employer] Patient Information 2015 [employer]. This information is not intended to replace advice given to you by your health care provider. Make sure you discuss any questions you have with your health care provider.  

## 2013-10-24 NOTE — ED Provider Notes (Signed)
CSN: [id number]     Arrival date & time 10/24/13  1138 History  This chart was scribed for Tanna Furry, MD by Tula Nakayama, ED Scribe. This patient was seen in room APA19/[facility]19 and the patient's care was started at 12:18 PM.    Chief Complaint  Patient presents with  . Chest Pain   The history is provided by the patient. No language interpreter was used.   HPI Comments: Emily [name] is a 33 y.o. female with a history of anxiety, GERD, MS, and chronic pain who presents to the Emergency Department complaining of constant, central CP associated with GERD that started yesterday. She states current symptoms are worse than usual episodes of reflux. Pt takes Prilosec for symptoms. She denies taking antacids because of adverse reaction. Pt states that she is currently anxious and hyperventilating because she saw blood in some of the acid that came up. Pt last took hydroxyzine 4 hours ago.   Past Medical History  Diagnosis Date  . Headache(784.0)     migraines  . Multiple sclerosis   . Asthma as a child  . GERD (gastroesophageal reflux disease)   . Urinary urgency   . Anxiety   . Perforated bowel 2009  . Migraines   . Depression   . Chronic back pain   . Chronic pain   . Ovarian cyst    Past Surgical History  Procedure Laterality Date  . Extremity cyst excision  1994    right leg  . 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 Colostomy closure  04/2007  . Scar revision  01/21/2011    Procedure: SCAR REVISION;  Surgeon: Hermelinda Dellen;  Location: [facility];  Service: Plastics;  Laterality: N/A;  exploration of scar of abdomen and repair of defect  . Hernia repair    . Abdominal surgery    . Appendectomy     Family History  Problem Relation Age of Onset  . Diabetes Mother   . Hypertension Mother   . Diabetes Father   . Hypertension Father    History  Substance Use Topics  . Smoking status: Current Some Day Smoker -- 0.40 packs/day for 10 years     Types: Cigarettes  . Smokeless tobacco: Never Used     Comment: quit smoking 10 days ago  . Alcohol Use: Yes     Comment: Rare   OB History   Grav Para Term Preterm Abortions TAB SAB Ect Mult Living                 Review of Systems  Constitutional: Negative for fever, chills, diaphoresis, appetite change and fatigue.  HENT: Negative for mouth sores, sore throat and trouble swallowing.   Eyes: Negative for visual disturbance.  Respiratory: Negative for cough, chest tightness, shortness of breath and wheezing.   Cardiovascular: Positive for chest pain.  Gastrointestinal: Negative for nausea, vomiting, abdominal pain, diarrhea and abdominal distention.  Endocrine: Negative for polydipsia, polyphagia and polyuria.  Genitourinary: Negative for dysuria, frequency and hematuria.  Musculoskeletal: Negative for gait problem.  Skin: Negative for color change, pallor and rash.  Neurological: Negative for dizziness, syncope, light-headedness and headaches.  Hematological: Does not bruise/bleed easily.  Psychiatric/Behavioral: Negative for behavioral problems and confusion. The patient is nervous/anxious.       Allergies  Amitriptyline; Baclofen; Cymbalta; Gabapentin; Monosodium glutamate; Vicodin; Alprazolam; Magnesium salicylate; Rizatriptan; Tizanidine; Toradol; Adhesive; and Lamotrigine  Home Medications   Prior to Admission medications  Medication Sig Start Date End Date Taking? Authorizing Provider  Alum & Mag Hydroxide-Simeth (MAGIC MOUTHWASH W/LIDOCAINE) SOLN Take 5 mLs by mouth 3 (three) times daily as needed (For reflux pain). 10/24/13   Tanna Furry, MD  busPIRone (BUSPAR) 5 MG tablet Take 5 mg by mouth 3 (three) times daily.    Historical Provider, MD  carbamazepine (CARBATROL) 200 MG 12 hr capsule Take 200 mg by mouth 3 (three) times daily. 04/22/13 04/22/14  Historical Provider, MD  clonazePAM (KLONOPIN) 1 MG tablet Take 1 mg by mouth 2 (two) times daily as needed for anxiety.      Historical Provider, MD  cyclobenzaprine (FLEXERIL) 10 MG tablet Take 10 mg by mouth 3 (three) times daily as needed for muscle spasms.    Historical Provider, MD  diphenhydrAMINE (BENADRYL) 25 MG tablet Take 50 mg by mouth once.    Historical Provider, MD  divalproex (DEPAKOTE ER) 500 MG 24 hr tablet Take 500 mg by mouth daily. For depression    Historical Provider, MD  EPINEPHrine (EPIPEN) 0.3 mg/0.3 mL SOAJ injection Inject 0.3 mg into the muscle as needed (allergic reaction).     Historical Provider, MD  Fingolimod HCl (GILENYA) 0.5 MG CAPS Take 0.5 mg by mouth daily.     Historical Provider, MD  ibuprofen (ADVIL,MOTRIN) 600 MG tablet Take 600 mg by mouth daily as needed (pain).    Historical Provider, MD  loratadine (CLARITIN) 10 MG tablet Take 10 mg by mouth daily.     Historical Provider, MD  medroxyPROGESTERone (DEPO-PROVERA) 150 MG/ML injection Inject 150 mg into the muscle every 3 (three) months. Last injection mid September    Historical Provider, MD  morphine (MSIR) 15 MG tablet Take 15 mg by mouth 4 (four) times daily.    Historical Provider, MD  omeprazole (PRILOSEC) 40 MG capsule Take 40 mg by mouth daily.     Historical Provider, MD  ondansetron (ZOFRAN) 8 MG tablet Take 8 mg by mouth every 6 (six) hours as needed for nausea or vomiting.     Historical Provider, MD  potassium chloride (K-DUR) 10 MEQ tablet Take 10 mEq by mouth 2 (two) times daily.    Historical Provider, MD  pregabalin (LYRICA) 200 MG capsule Take 600 mg by mouth at bedtime.    Historical Provider, MD  promethazine (PHENERGAN) 25 MG tablet Take 25 mg by mouth every 6 (six) hours as needed for nausea or vomiting.     Historical Provider, MD  propranolol (INDERAL) 10 MG tablet Take 10 mg by mouth 2 (two) times daily.    Historical Provider, MD  ranitidine (ZANTAC) 300 MG capsule Take 300 mg by mouth at bedtime.     Historical Provider, MD  topiramate (TOPAMAX) 100 MG tablet Take 100 mg by mouth 2 (two) times daily.      Historical Provider, MD  venlafaxine (EFFEXOR) 75 MG tablet Take 75 mg by mouth 3 (three) times daily with meals.    Historical Provider, MD   BP 140/89  Pulse 107  Temp(Src) 97.5 F (36.4 C) (Oral)  Ht 5\' 5"  (1.651 m)  Wt 200 lb (90.719 kg)  BMI 33.28 kg/m2  SpO2 100% Physical Exam  Nursing note and vitals reviewed. Constitutional: She is oriented to person, place, and time. She appears well-developed and well-nourished. No distress.  Hyperventilating and crying.   HENT:  Head: Normocephalic.  Eyes: Conjunctivae are normal. Pupils are equal, round, and reactive to light. No scleral icterus.  Neck: Normal range of motion.  Neck supple. No thyromegaly present.  Cardiovascular: Normal rate and regular rhythm.  Exam reveals no gallop and no friction rub.   No murmur heard. Pulmonary/Chest: Effort normal and breath sounds normal. No respiratory distress. She has no wheezes. She has no rales.  Abdominal: Soft. Bowel sounds are normal. She exhibits no distension. There is no tenderness. There is no rebound.  Nontender in upper abdomen  Musculoskeletal: Normal range of motion.  Neurological: She is alert and oriented to person, place, and time.  Skin: Skin is warm and dry. No rash noted.  Psychiatric: She has a normal mood and affect. Her behavior is normal.    ED Course  Procedures (including critical care time) DIAGNOSTIC STUDIES: Oxygen Saturation is 100% on RA, normal by my interpretation.    COORDINATION OF CARE: 12:24 PM Discussed treatment plan with pt at bedside and pt agreed to plan.  Labs Review Labs Reviewed - No data to display  Imaging Review No results found.   EKG Interpretation   Date/Time:  Sunday October 24 2013 11:56:31 EDT Ventricular Rate:  100 PR Interval:  168 QRS Duration: 81 QT Interval:  357 QTC Calculation: 460 R Axis:   53 Text Interpretation:  Sinus tachycardia Low voltage, precordial leads  Confirmed by Jeneen Rinks  MD, [place] ([id number]) on  10/24/2013 12:31:45 PM      MDM   Final diagnoses:  Esophagitis  Anxiety   On recheck patient has gotten significant relief. Much more calm. Not hyperventilating. Plan is discharge home. Given prescription for Maalox with lidocaine as needed for pain.  I personally performed the services described in this documentation, which was scribed in my presence. The recorded information has been reviewed and is accurate.     Tanna Furry, MD 10/24/13 [phone]

## 2013-10-29 ENCOUNTER — Emergency Department (HOSPITAL_COMMUNITY): Payer: Medicare Other

## 2013-10-29 ENCOUNTER — Emergency Department (HOSPITAL_COMMUNITY)
Admission: EM | Admit: 2013-10-29 | Discharge: 2013-10-29 | Disposition: A | Discharge status: Home / Self Care | Payer: Medicare Other | Attending: Emergency Medicine | Admitting: Emergency Medicine

## 2013-10-29 ENCOUNTER — Encounter (HOSPITAL_COMMUNITY): Payer: Self-pay | Admitting: Emergency Medicine

## 2013-10-29 DIAGNOSIS — J45901 Unspecified asthma with (acute) exacerbation: Secondary | ICD-10-CM | POA: Diagnosis not present

## 2013-10-29 DIAGNOSIS — Z3202 Encounter for pregnancy test, result negative: Secondary | ICD-10-CM | POA: Diagnosis not present

## 2013-10-29 DIAGNOSIS — G43909 Migraine, unspecified, not intractable, without status migrainosus: Secondary | ICD-10-CM | POA: Insufficient documentation

## 2013-10-29 DIAGNOSIS — R079 Chest pain, unspecified: Secondary | ICD-10-CM | POA: Diagnosis not present

## 2013-10-29 DIAGNOSIS — K219 Gastro-esophageal reflux disease without esophagitis: Secondary | ICD-10-CM | POA: Insufficient documentation

## 2013-10-29 DIAGNOSIS — R112 Nausea with vomiting, unspecified: Secondary | ICD-10-CM

## 2013-10-29 DIAGNOSIS — F410 Panic disorder [episodic paroxysmal anxiety] without agoraphobia: Secondary | ICD-10-CM | POA: Diagnosis present

## 2013-10-29 DIAGNOSIS — Z8742 Personal history of other diseases of the female genital tract: Secondary | ICD-10-CM | POA: Insufficient documentation

## 2013-10-29 DIAGNOSIS — R197 Diarrhea, unspecified: Secondary | ICD-10-CM | POA: Diagnosis not present

## 2013-10-29 DIAGNOSIS — Z720 Tobacco use: Secondary | ICD-10-CM | POA: Insufficient documentation

## 2013-10-29 DIAGNOSIS — F329 Major depressive disorder, single episode, unspecified: Secondary | ICD-10-CM | POA: Insufficient documentation

## 2013-10-29 DIAGNOSIS — G8929 Other chronic pain: Secondary | ICD-10-CM | POA: Diagnosis not present

## 2013-10-29 DIAGNOSIS — Z79899 Other long term (current) drug therapy: Secondary | ICD-10-CM | POA: Diagnosis not present

## 2013-10-29 DIAGNOSIS — F419 Anxiety disorder, unspecified: Secondary | ICD-10-CM | POA: Diagnosis not present

## 2013-10-29 DIAGNOSIS — R000 Tachycardia, unspecified: Secondary | ICD-10-CM | POA: Diagnosis not present

## 2013-10-29 LAB — CBC WITH DIFFERENTIAL/PLATELET
Basophils Absolute: 0 10*3/uL (ref 0.0–0.1)
Basophils Relative: 1 % (ref 0–1)
EOS PCT: 1 % (ref 0–5)
Eosinophils Absolute: 0.1 10*3/uL (ref 0.0–0.7)
HCT: 37 % (ref 36.0–46.0)
Hemoglobin: 12.6 g/dL (ref 12.0–15.0)
LYMPHS ABS: 3.2 10*3/uL (ref 0.7–4.0)
LYMPHS PCT: 37 % (ref 12–46)
MCH: 30.9 pg (ref 26.0–34.0)
MCHC: 34.1 g/dL (ref 30.0–36.0)
MCV: 90.7 fL (ref 78.0–100.0)
Monocytes Absolute: 0.5 10*3/uL (ref 0.1–1.0)
Monocytes Relative: 6 % (ref 3–12)
NEUTROS PCT: 55 % (ref 43–77)
Neutro Abs: 4.8 10*3/uL (ref 1.7–7.7)
PLATELETS: 298 10*3/uL (ref 150–400)
RBC: 4.08 MIL/uL (ref 3.87–5.11)
RDW: 13.6 % (ref 11.5–15.5)
WBC: 8.6 10*3/uL (ref 4.0–10.5)

## 2013-10-29 LAB — COMPREHENSIVE METABOLIC PANEL
ALT: 16 U/L (ref 0–35)
ANION GAP: 12 (ref 5–15)
AST: 13 U/L (ref 0–37)
Albumin: 3.4 g/dL — ABNORMAL LOW (ref 3.5–5.2)
Alkaline Phosphatase: 74 U/L (ref 39–117)
BUN: 13 mg/dL (ref 6–23)
CALCIUM: 8.7 mg/dL (ref 8.4–10.5)
CO2: 23 mEq/L (ref 19–32)
Chloride: 108 mEq/L (ref 96–112)
Creatinine, Ser: 0.67 mg/dL (ref 0.50–1.10)
GFR calc non Af Amer: >90 mL/min (ref >90)
Glucose, Bld: 86 mg/dL (ref 70–99)
POTASSIUM: 3.9 meq/L (ref 3.7–5.3)
SODIUM: 143 meq/L (ref 137–147)
TOTAL PROTEIN: 6.9 g/dL (ref 6.0–8.3)
Total Bilirubin: <0.2 mg/dL — ABNORMAL LOW (ref 0.3–1.2)

## 2013-10-29 LAB — URINALYSIS, ROUTINE W REFLEX MICROSCOPIC
Bilirubin Urine: NEGATIVE
Glucose, UA: NEGATIVE mg/dL
Hgb urine dipstick: NEGATIVE
KETONES UR: NEGATIVE mg/dL
LEUKOCYTES UA: NEGATIVE
NITRITE: NEGATIVE
PROTEIN: NEGATIVE mg/dL
Specific Gravity, Urine: 1.021 (ref 1.005–1.030)
Urobilinogen, UA: 0.2 mg/dL (ref 0.0–1.0)
pH: 5 (ref 5.0–8.0)

## 2013-10-29 LAB — LIPASE, BLOOD: Lipase: 39 U/L (ref 11–59)

## 2013-10-29 LAB — I-STAT TROPONIN, ED: TROPONIN I, POC: 0.02 ng/mL (ref 0.00–0.08)

## 2013-10-29 LAB — CBG MONITORING, ED: Glucose-Capillary: 85 mg/dL (ref 70–99)

## 2013-10-29 LAB — PREGNANCY, URINE: PREG TEST UR: NEGATIVE

## 2013-10-29 LAB — D-DIMER, QUANTITATIVE (NOT AT ARMC): D DIMER QUANT: 0.33 ug{FEU}/mL (ref 0.00–0.48)

## 2013-10-29 MED ORDER — ONDANSETRON HCL 4 MG PO TABS: 4.0000 mg | ORAL_TABLET | Freq: Four times a day (QID) | ORAL | Status: DC

## 2013-10-29 MED ORDER — LORAZEPAM 2 MG/ML IJ SOLN
1.0000 mg | Freq: Once | INTRAMUSCULAR | Status: AC
  Administered 2013-10-29: 1 mg via INTRAVENOUS
  Filled 2013-10-29: qty 1

## 2013-10-29 MED ORDER — SODIUM CHLORIDE 0.9 % IV BOLUS (SEPSIS)
1000.0000 mL | Freq: Once | INTRAVENOUS | Status: AC
  Administered 2013-10-29: 1000 mL via INTRAVENOUS

## 2013-10-29 MED ORDER — IBUPROFEN 800 MG PO TABS
800.0000 mg | ORAL_TABLET | Freq: Once | ORAL | Status: AC
  Administered 2013-10-29: 800 mg via ORAL
  Filled 2013-10-29: qty 1

## 2013-10-29 MED ORDER — ONDANSETRON HCL 4 MG/2ML IJ SOLN
4.0000 mg | Freq: Once | INTRAMUSCULAR | Status: AC
  Administered 2013-10-29: 4 mg via INTRAVENOUS
  Filled 2013-10-29: qty 2

## 2013-10-29 NOTE — ED Notes (Signed)
Pt has returned from using the bathroom with urine sample obtained; placed back on monitor, continuous pulse oximetry and blood pressure cuff

## 2013-10-29 NOTE — ED Notes (Signed)
Presents with anxiety, feeling nauseated, headaches, tingling all over body, feeling anxious, diarrhea since Sunday. Denies any increase in stress. This is similair to anxiety attacks she has had in the past only this one is lasting longer and is more intense. HR 125-130.

## 2013-10-29 NOTE — ED Notes (Signed)
Patient transported to X-ray 

## 2013-10-29 NOTE — ED Provider Notes (Signed)
CSN: [id number]     Arrival date & time 10/29/13  1503 History   First MD Initiated Contact with Patient 10/29/13 1535     Chief Complaint  Patient presents with  . Panic Attack     (Consider location/radiation/quality/duration/timing/severity/associated sxs/prior Treatment) The history is provided by the patient and medical records.   A 33 year old female with past medical history significant for multiple sclerosis, asthma, GERD, anxiety, depression, chronic pain, presenting to the ED for multiple complaints.  Patient states over the past 4 days she has been feeling well with nausea, vomiting, and diarrhea. States last episode of vomiting was around 1230 today, has been able to tolerate PO fluid since that time.  Denies recent sick contacts.  She denies current abdominal pain, urinary sx, or vaginal complaints.  Patient also complains of some mid-chest pressure and SOB.  Denies radiation of pain, but does complain of paresthesias in bilateral hands with sensation of impending numbness.  Denies fever, chills, cough, sore throat, nasal congestion, or other URI symptoms.  Patient states she has baseline paresthesias of her hands due to her MS, states this seems more intense than normal.  Denies weakness, confusion, changes in speech, tinnitus, visual disturbance, or ataxia.  Denies possibility of pregnancy, on Depo-Provera.  Patient tachycardic on arrival.  Past Medical History  Diagnosis Date  . Headache(784.0)     migraines  . Multiple sclerosis   . Asthma as a child  . GERD (gastroesophageal reflux disease)   . Urinary urgency   . Anxiety   . Perforated bowel 2009  . Migraines   . Depression   . Chronic back pain   . Chronic pain   . Ovarian cyst    Past Surgical History  Procedure Laterality Date  . Extremity cyst excision  1994    right leg  . 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 Colostomy closure  04/2007  . Scar revision  01/21/2011    Procedure: SCAR REVISION;   Surgeon: Hermelinda Dellen;  Location: [facility];  Service: Plastics;  Laterality: N/A;  exploration of scar of abdomen and repair of defect  . Hernia repair    . Abdominal surgery    . Appendectomy     Family History  Problem Relation Age of Onset  . Diabetes Mother   . Hypertension Mother   . Diabetes Father   . Hypertension Father    History  Substance Use Topics  . Smoking status: Current Some Day Smoker -- 0.40 packs/day for 10 years    Types: Cigarettes  . Smokeless tobacco: Never Used     Comment: quit smoking 10 days ago  . Alcohol Use: Yes     Comment: Rare   OB History   Grav Para Term Preterm Abortions TAB SAB Ect Mult Living                 Review of Systems  Respiratory: Positive for shortness of breath.   Cardiovascular: Positive for chest pain.  Gastrointestinal: Positive for nausea, vomiting and diarrhea.  Neurological:       Paresthesias, bilateral hands  Psychiatric/Behavioral: The patient is nervous/anxious.   All other systems reviewed and are negative.     Allergies  Amitriptyline; Baclofen; Cymbalta; Gabapentin; Monosodium glutamate; Vicodin; Alprazolam; Magnesium salicylate; Rizatriptan; Tizanidine; Toradol; Adhesive; and Lamotrigine  Home Medications   Prior to Admission medications   Medication Sig Start Date End Date Taking? Authorizing Provider  Alum & Mag Hydroxide-Simeth (  MAGIC MOUTHWASH W/LIDOCAINE) SOLN Take 5 mLs by mouth 3 (three) times daily as needed (For reflux pain). 10/24/13   Tanna Furry, MD  busPIRone (BUSPAR) 5 MG tablet Take 5 mg by mouth 3 (three) times daily.    Historical Provider, MD  carbamazepine (CARBATROL) 200 MG 12 hr capsule Take 200 mg by mouth 3 (three) times daily. 04/22/13 04/22/14  Historical Provider, MD  clonazePAM (KLONOPIN) 1 MG tablet Take 1 mg by mouth 2 (two) times daily as needed for anxiety.     Historical Provider, MD  cyclobenzaprine (FLEXERIL) 10 MG tablet Take 10 mg by mouth 3 (three)  times daily as needed for muscle spasms.    Historical Provider, MD  diphenhydrAMINE (BENADRYL) 25 MG tablet Take 50 mg by mouth once.    Historical Provider, MD  divalproex (DEPAKOTE ER) 500 MG 24 hr tablet Take 500 mg by mouth daily. For depression    Historical Provider, MD  EPINEPHrine (EPIPEN) 0.3 mg/0.3 mL SOAJ injection Inject 0.3 mg into the muscle as needed (allergic reaction).     Historical Provider, MD  Fingolimod HCl (GILENYA) 0.5 MG CAPS Take 0.5 mg by mouth daily.     Historical Provider, MD  ibuprofen (ADVIL,MOTRIN) 600 MG tablet Take 600 mg by mouth daily as needed (pain).    Historical Provider, MD  loratadine (CLARITIN) 10 MG tablet Take 10 mg by mouth daily.     Historical Provider, MD  medroxyPROGESTERone (DEPO-PROVERA) 150 MG/ML injection Inject 150 mg into the muscle every 3 (three) months. Last injection mid September    Historical Provider, MD  morphine (MSIR) 15 MG tablet Take 15 mg by mouth 4 (four) times daily.    Historical Provider, MD  omeprazole (PRILOSEC) 40 MG capsule Take 40 mg by mouth daily.     Historical Provider, MD  ondansetron (ZOFRAN) 8 MG tablet Take 8 mg by mouth every 6 (six) hours as needed for nausea or vomiting.     Historical Provider, MD  potassium chloride (K-DUR) 10 MEQ tablet Take 10 mEq by mouth 2 (two) times daily.    Historical Provider, MD  pregabalin (LYRICA) 200 MG capsule Take 600 mg by mouth at bedtime.    Historical Provider, MD  promethazine (PHENERGAN) 25 MG tablet Take 25 mg by mouth every 6 (six) hours as needed for nausea or vomiting.     Historical Provider, MD  propranolol (INDERAL) 10 MG tablet Take 10 mg by mouth 2 (two) times daily.    Historical Provider, MD  ranitidine (ZANTAC) 300 MG capsule Take 300 mg by mouth at bedtime.     Historical Provider, MD  topiramate (TOPAMAX) 100 MG tablet Take 100 mg by mouth 2 (two) times daily.     Historical Provider, MD  venlafaxine (EFFEXOR) 75 MG tablet Take 75 mg by mouth 3 (three)  times daily with meals.    Historical Provider, MD   BP 145/116  Pulse 124  Temp(Src) 97.9 F (36.6 C) (Oral)  Resp 22  Wt 214 lb 1 oz (97.098 kg)  SpO2 99%  Physical Exam  Nursing note and vitals reviewed. Constitutional: She is oriented to person, place, and time. She appears well-developed and well-nourished. No distress.  HENT:  Head: Normocephalic and atraumatic.  Right Ear: Tympanic membrane and ear canal normal.  Left Ear: Tympanic membrane and ear canal normal.  Nose: Nose normal.  Mouth/Throat: Uvula is midline and oropharynx is clear and moist. No posterior oropharyngeal edema, posterior oropharyngeal erythema or tonsillar abscesses.  Mildly dry mucous membranes  Eyes: Conjunctivae and EOM are normal. Pupils are equal, round, and reactive to light.  Neck: Normal range of motion. Neck supple.  Cardiovascular: Regular rhythm and normal heart sounds.  Tachycardia present.   Pulmonary/Chest: Effort normal and breath sounds normal. No respiratory distress. She has no wheezes.  Abdominal: Soft. Bowel sounds are normal. There is no tenderness. There is no guarding and no CVA tenderness.  Abdomen soft, non-distended, no focal tenderness or peritoneal signs  Musculoskeletal: Normal range of motion.  Neurological: She is alert and oriented to person, place, and time.  AAOx3, answering questions appropriately; equal strength UE and LE bilaterally; CN grossly intact; moves all extremities appropriately without ataxia; no focal neuro deficits or facial asymmetry appreciated  Skin: Skin is warm and dry. She is not diaphoretic.  Psychiatric: Her mood appears anxious.  Anxious appearing    ED Course  Procedures (including critical care time) Labs Review Labs Reviewed  COMPREHENSIVE METABOLIC PANEL - Abnormal; Notable for the following:    Albumin 3.4 (*)    Total Bilirubin <0.2 (*)    All other components within normal limits  URINALYSIS, ROUTINE W REFLEX MICROSCOPIC - Abnormal;  Notable for the following:    APPearance HAZY (*)    All other components within normal limits  LIPASE, BLOOD  D-DIMER, QUANTITATIVE  CBC WITH DIFFERENTIAL  PREGNANCY, URINE  CBC WITH DIFFERENTIAL  I-STAT TROPOININ, ED  CBG MONITORING, ED    Imaging Review Dg Chest 2 View  10/29/2013   CLINICAL DATA:  Patient with multiple sclerosis. Chest pain, shortness of breath and nausea for 1 week.  EXAM: CHEST  2 VIEW  COMPARISON:  None.  FINDINGS: The heart size and mediastinal contours are within normal limits. There is no focal infiltrate, pulmonary edema, or pleural effusion. The visualized skeletal structures are stable.  IMPRESSION: No active cardiopulmonary disease.   Electronically Signed   By: Abelardo Diesel M.D.   On: 10/29/2013 16:39     EKG Interpretation   Date/Time:  Friday October 29 2013 15:41:35 EDT Ventricular Rate:  120 PR Interval:  156 QRS Duration: 80 QT Interval:  314 QTC Calculation: 444 R Axis:   64 Text Interpretation:  Sinus tachycardia Probable left atrial enlargement  Baseline wander in lead(s) II III aVL aVF V4 V5 Similar to prior Confirmed  by Mingo Amber  MD, [place] ([id number]) on 10/29/2013 5:12:36 PM      MDM   Final diagnoses:  Chest pain  Nausea vomiting and diarrhea   33 year old female presented with multiple complaints. Of note, she is a 29 ED visits in the past 6 months for various complaints. She has complaint of gastroenteritis type symptoms as well as headache and paresthesias of bilateral hands. Abdominal exam is benign.  Neurologic exam is non-focal.  She does have history of anxiety, states symptoms are typical of her attacks but the more severe this time. She also notes midsternal chest pain and shortness of breath. On exam, patient afebrile overall nontoxic appearing. She does appear extremely anxious.  EKG sinus tachycardia.  Labwork, chest x-ray, and urine pending at this time. Patient given fluids and Zofran.  Labwork including d-dimer is  reassuring.  U/a non-infectious.  After fluids, Zofran, Ativan, and Motrin, patient states she is feeling much better. She remains mildly tachycardic, but improved from previous. Abdominal exam remains benign. Neurologic exam remains non-focal. She does remain very anxious appearing. Patient may have some viral gastroenteritis at present, but feel the remainder of  her symptoms are largely due to her anxiety.  Low suspicion for ACS, PE, dissection, or other acute cardiac event.  Low suspicion for intracranial pathology given that her hand paresthesias are bilateral and neurologic exam is non-focal.  Patient will be discharged home with supportive care.  She has previously scheduled FU with her PCP in 1 week.  Discussed plan with patient, he/she acknowledged understanding and agreed with plan of care.  Return precautions given for new or worsening symptoms.  Case discussed with attending physician, Dr. Mingo Amber, who agrees with assessment and plan of care.  Larene Pickett, PA-C 10/29/13 2100

## 2013-10-29 NOTE — ED Notes (Signed)
Patient returned from X-ray 

## 2013-10-29 NOTE — Discharge Instructions (Signed)
Take the prescribed medication as directed.  Make sure to drink plenty of fluids to keep yourself hydrated. Follow-up with your primary care physician next week as previously scheduled. Return to the ED for new or worsening symptoms.

## 2013-10-29 NOTE — ED Notes (Signed)
Lattie Haw, PA-C, is at the bedside.

## 2013-10-30 NOTE — ED Provider Notes (Signed)
Medical screening examination/treatment/procedure(s) were performed by non-physician practitioner and as supervising physician I was immediately available for consultation/collaboration.   EKG Interpretation   Date/Time:  Friday October 29 2013 15:41:35 EDT Ventricular Rate:  120 PR Interval:  156 QRS Duration: 80 QT Interval:  314 QTC Calculation: 444 R Axis:   64 Text Interpretation:  Sinus tachycardia Probable left atrial enlargement  Baseline wander in lead(s) II III aVL aVF V4 V5 Similar to prior Confirmed  by Mingo Amber  MD, [place] ([id number]) on 10/29/2013 5:12:36 PM        Evelina Bucy, MD 10/30/13 2130

## 2013-10-31 ENCOUNTER — Encounter (HOSPITAL_COMMUNITY): Payer: Self-pay | Admitting: Emergency Medicine

## 2013-10-31 ENCOUNTER — Emergency Department (HOSPITAL_COMMUNITY)
Admission: EM | Admit: 2013-10-31 | Discharge: 2013-10-31 | Discharge status: Against Medical Advice | Payer: Medicare Other | Attending: Emergency Medicine | Admitting: Emergency Medicine

## 2013-10-31 ENCOUNTER — Emergency Department (HOSPITAL_COMMUNITY): Payer: Medicare Other

## 2013-10-31 ENCOUNTER — Emergency Department (HOSPITAL_COMMUNITY)
Admission: EM | Admit: 2013-10-31 | Discharge: 2013-10-31 | Disposition: A | Discharge status: Home / Self Care | Payer: Medicare Other | Attending: Emergency Medicine | Admitting: Emergency Medicine

## 2013-10-31 DIAGNOSIS — G43909 Migraine, unspecified, not intractable, without status migrainosus: Secondary | ICD-10-CM | POA: Diagnosis not present

## 2013-10-31 DIAGNOSIS — Z720 Tobacco use: Secondary | ICD-10-CM | POA: Diagnosis not present

## 2013-10-31 DIAGNOSIS — K219 Gastro-esophageal reflux disease without esophagitis: Secondary | ICD-10-CM | POA: Insufficient documentation

## 2013-10-31 DIAGNOSIS — J45909 Unspecified asthma, uncomplicated: Secondary | ICD-10-CM | POA: Insufficient documentation

## 2013-10-31 DIAGNOSIS — F419 Anxiety disorder, unspecified: Secondary | ICD-10-CM | POA: Diagnosis not present

## 2013-10-31 DIAGNOSIS — Z793 Long term (current) use of hormonal contraceptives: Secondary | ICD-10-CM | POA: Insufficient documentation

## 2013-10-31 DIAGNOSIS — M545 Low back pain, unspecified: Secondary | ICD-10-CM

## 2013-10-31 DIAGNOSIS — G8929 Other chronic pain: Secondary | ICD-10-CM | POA: Diagnosis not present

## 2013-10-31 DIAGNOSIS — R1115 Cyclical vomiting syndrome unrelated to migraine: Secondary | ICD-10-CM

## 2013-10-31 DIAGNOSIS — Z79899 Other long term (current) drug therapy: Secondary | ICD-10-CM | POA: Diagnosis not present

## 2013-10-31 DIAGNOSIS — G43A1 Cyclical vomiting, intractable: Secondary | ICD-10-CM | POA: Insufficient documentation

## 2013-10-31 DIAGNOSIS — F329 Major depressive disorder, single episode, unspecified: Secondary | ICD-10-CM | POA: Insufficient documentation

## 2013-10-31 DIAGNOSIS — Z8742 Personal history of other diseases of the female genital tract: Secondary | ICD-10-CM | POA: Insufficient documentation

## 2013-10-31 DIAGNOSIS — M549 Dorsalgia, unspecified: Secondary | ICD-10-CM | POA: Diagnosis present

## 2013-10-31 DIAGNOSIS — R112 Nausea with vomiting, unspecified: Secondary | ICD-10-CM | POA: Diagnosis present

## 2013-10-31 DIAGNOSIS — G4489 Other headache syndrome: Secondary | ICD-10-CM

## 2013-10-31 LAB — BASIC METABOLIC PANEL
ANION GAP: 15 (ref 5–15)
BUN: 13 mg/dL (ref 6–23)
CO2: 24 mEq/L (ref 19–32)
Calcium: 8.6 mg/dL (ref 8.4–10.5)
Chloride: 104 mEq/L (ref 96–112)
Creatinine, Ser: 0.59 mg/dL (ref 0.50–1.10)
GFR calc Af Amer: >90 mL/min (ref >90)
Glucose, Bld: 106 mg/dL — ABNORMAL HIGH (ref 70–99)
Potassium: 3.5 mEq/L — ABNORMAL LOW (ref 3.7–5.3)
SODIUM: 143 meq/L (ref 137–147)

## 2013-10-31 LAB — CBC WITH DIFFERENTIAL/PLATELET
BASOS ABS: 0 10*3/uL (ref 0.0–0.1)
Basophils Relative: 0 % (ref 0–1)
EOS ABS: 0.2 10*3/uL (ref 0.0–0.7)
EOS PCT: 2 % (ref 0–5)
HCT: 38.8 % (ref 36.0–46.0)
Hemoglobin: 13.3 g/dL (ref 12.0–15.0)
Lymphocytes Relative: 28 % (ref 12–46)
Lymphs Abs: 2.8 10*3/uL (ref 0.7–4.0)
MCH: 31.2 pg (ref 26.0–34.0)
MCHC: 34.3 g/dL (ref 30.0–36.0)
MCV: 91.1 fL (ref 78.0–100.0)
Monocytes Absolute: 0.6 10*3/uL (ref 0.1–1.0)
Monocytes Relative: 6 % (ref 3–12)
NEUTROS PCT: 64 % (ref 43–77)
Neutro Abs: 6.6 10*3/uL (ref 1.7–7.7)
PLATELETS: 336 10*3/uL (ref 150–400)
RBC: 4.26 MIL/uL (ref 3.87–5.11)
RDW: 13.5 % (ref 11.5–15.5)
WBC: 10.2 10*3/uL (ref 4.0–10.5)

## 2013-10-31 LAB — LIPASE, BLOOD: LIPASE: 48 U/L (ref 11–59)

## 2013-10-31 MED ORDER — SODIUM CHLORIDE 0.9 % IV SOLN: 1000.0000 mL | Freq: Once | INTRAVENOUS | Status: DC

## 2013-10-31 MED ORDER — PANTOPRAZOLE SODIUM 40 MG IV SOLR
40.0000 mg | Freq: Once | INTRAVENOUS | Status: DC
Start: 2013-10-31 — End: 2013-10-31
  Filled 2013-10-31: qty 40

## 2013-10-31 MED ORDER — METOCLOPRAMIDE HCL 5 MG/ML IJ SOLN
10.0000 mg | Freq: Once | INTRAMUSCULAR | Status: AC
  Administered 2013-10-31: 10 mg via INTRAVENOUS
  Filled 2013-10-31: qty 2

## 2013-10-31 MED ORDER — METHOCARBAMOL 500 MG PO TABS
500.0000 mg | ORAL_TABLET | Freq: Once | ORAL | Status: AC
  Administered 2013-10-31: 500 mg via ORAL
  Filled 2013-10-31: qty 1

## 2013-10-31 MED ORDER — DIPHENHYDRAMINE HCL 50 MG/ML IJ SOLN
25.0000 mg | Freq: Once | INTRAMUSCULAR | Status: AC
  Administered 2013-10-31: 25 mg via INTRAVENOUS
  Filled 2013-10-31: qty 1

## 2013-10-31 MED ORDER — SODIUM CHLORIDE 0.9 % IV SOLN: 1000.0000 mL | INTRAVENOUS | Status: DC

## 2013-10-31 MED ORDER — ONDANSETRON HCL 4 MG/2ML IJ SOLN
4.0000 mg | Freq: Once | INTRAMUSCULAR | Status: AC
  Administered 2013-10-31: 4 mg via INTRAVENOUS
  Filled 2013-10-31: qty 2

## 2013-10-31 MED ORDER — METHOCARBAMOL 500 MG PO TABS: 500.0000 mg | ORAL_TABLET | Freq: Four times a day (QID) | ORAL | Status: DC

## 2013-10-31 NOTE — ED Notes (Signed)
Pt states she was going to sit in the car just pTA and she felt a sudden sharp pain in her lower back radiating into her mid back. Denies bowel/bladder changes. MAE. She tried nothing for pain pta. Pain has persisted since onset

## 2013-10-31 NOTE — ED Provider Notes (Signed)
CSN: [id number]     Arrival date & time 10/31/13  0545 History   First MD Initiated Contact with Patient 10/31/13 0645     Chief Complaint  Patient presents with  . Emesis    HPI Pt developed one of her recurrent migraine headaches.  These are frequent and bring her to the ED several times per month usually.  She then started having nausea and vomiting.  After the vomiting started she developed a burning in her stomach and up into the mid part of her chest.  No fevers.  No diarrhea.  No dysuria.  She has tried medications at home but nothing helps.  No shortness of breath.  She is constantly retching and cannot get it to stop  The headache is a throbbing sensation and severe.   Similar to her prior headaches.  Nothing helps.  No neck stiffness.  No trauma.  No numbness or weakness. Past Medical History  Diagnosis Date  . Headache(784.0)     migraines  . Multiple sclerosis   . Asthma as a child  . GERD (gastroesophageal reflux disease)   . Urinary urgency   . Anxiety   . Perforated bowel 2009  . Migraines   . Depression   . Chronic back pain   . Chronic pain   . Ovarian cyst    Past Surgical History  Procedure Laterality Date  . Extremity cyst excision  1994    right leg  . 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 Colostomy closure  04/2007  . Scar revision  01/21/2011    Procedure: SCAR REVISION;  Surgeon: Hermelinda Dellen;  Location: [facility];  Service: Plastics;  Laterality: N/A;  exploration of scar of abdomen and repair of defect  . Hernia repair    . Abdominal surgery    . Appendectomy     Family History  Problem Relation Age of Onset  . Diabetes Mother   . Hypertension Mother   . Diabetes Father   . Hypertension Father    History  Substance Use Topics  . Smoking status: Current Some Day Smoker -- 0.40 packs/day for 10 years    Types: Cigarettes  . Smokeless tobacco: Never Used     Comment: quit smoking 10 days ago  . Alcohol Use: Yes     Comment: Rare   OB History   Grav Para Term Preterm Abortions TAB SAB Ect Mult Living                 Review of Systems  All other systems reviewed and are negative.     Allergies  Amitriptyline; Baclofen; Cymbalta; Gabapentin; Monosodium glutamate; Vicodin; Alprazolam; Magnesium salicylate; Rizatriptan; Tizanidine; Toradol; Adhesive; and Lamotrigine  Home Medications   Prior to Admission medications   Medication Sig Start Date End Date Taking? Authorizing Provider  Alum & Mag Hydroxide-Simeth (MAGIC MOUTHWASH W/LIDOCAINE) SOLN Take 5 mLs by mouth 3 (three) times daily as needed (For reflux pain). 10/24/13   Tanna Furry, MD  busPIRone (BUSPAR) 5 MG tablet Take 5 mg by mouth 3 (three) times daily.    Historical Provider, MD  carbamazepine (CARBATROL) 200 MG 12 hr capsule Take 200 mg by mouth 3 (three) times daily. 04/22/13 04/22/14  Historical Provider, MD  clonazePAM (KLONOPIN) 1 MG tablet Take 1 mg by mouth 2 (two) times daily as needed for anxiety.     Historical Provider, MD  cyclobenzaprine (FLEXERIL) 10 MG tablet Take 10 mg by  mouth 3 (three) times daily as needed for muscle spasms.    Historical Provider, MD  diphenhydrAMINE (BENADRYL) 25 MG tablet Take 50 mg by mouth daily.     Historical Provider, MD  EPINEPHrine (EPIPEN) 0.3 mg/0.3 mL SOAJ injection Inject 0.3 mg into the muscle as needed (allergic reaction).     Historical Provider, MD  Fingolimod HCl (GILENYA) 0.5 MG CAPS Take 0.5 mg by mouth daily.     Historical Provider, MD  loratadine (CLARITIN) 10 MG tablet Take 10 mg by mouth daily.     Historical Provider, MD  medroxyPROGESTERone (DEPO-PROVERA) 150 MG/ML injection Inject 150 mg into the muscle every 3 (three) months. Last injection mid September    Historical Provider, MD  omeprazole (PRILOSEC) 40 MG capsule Take 40 mg by mouth daily.     Historical Provider, MD  ondansetron (ZOFRAN) 4 MG tablet Take 1 tablet (4 mg total) by mouth every 6 (six) hours. 10/29/13   Larene Pickett, PA-C  ondansetron (ZOFRAN) 8 MG tablet Take 8 mg by mouth every 6 (six) hours as needed for nausea or vomiting.     Historical Provider, MD  potassium chloride (K-DUR) 10 MEQ tablet Take 10 mEq by mouth 2 (two) times daily.    Historical Provider, MD  pregabalin (LYRICA) 200 MG capsule Take 600 mg by mouth at bedtime.    Historical Provider, MD  promethazine (PHENERGAN) 25 MG tablet Take 25 mg by mouth every 6 (six) hours as needed for nausea or vomiting.     Historical Provider, MD  propranolol (INDERAL) 10 MG tablet Take 10 mg by mouth 2 (two) times daily.    Historical Provider, MD  ranitidine (ZANTAC) 300 MG capsule Take 300 mg by mouth at bedtime.     Historical Provider, MD  topiramate (TOPAMAX) 100 MG tablet Take 100 mg by mouth 2 (two) times daily.     Historical Provider, MD  venlafaxine (EFFEXOR) 75 MG tablet Take 75 mg by mouth 3 (three) times daily with meals.    Historical Provider, MD   BP 144/100  Pulse 112  Temp(Src) 98.1 F (36.7 C) (Oral)  Resp 28  Ht 5\' 5"  (1.651 m)  Wt 214 lb (97.07 kg)  BMI 35.61 kg/m2  SpO2 98% Physical Exam  Nursing note and vitals reviewed. Constitutional: She appears well-developed and well-nourished.  HENT:  Head: Normocephalic and atraumatic.  Right Ear: External ear normal.  Left Ear: External ear normal.  Eyes: Conjunctivae are normal. Right eye exhibits no discharge. Left eye exhibits no discharge. No scleral icterus.  Neck: Neck supple. No tracheal deviation present.  Cardiovascular: Normal rate, regular rhythm and intact distal pulses.   Pulmonary/Chest: Effort normal and breath sounds normal. No stridor. No respiratory distress. She has no wheezes. She has no rales.  Abdominal: Soft. Bowel sounds are normal. She exhibits no distension. There is no tenderness. There is no rebound and no guarding.  forceful retching  Musculoskeletal: She exhibits no edema and no tenderness.  Neurological: She is alert. She has normal strength.  No cranial nerve deficit (no facial droop, extraocular movements intact, no slurred speech) or sensory deficit. She exhibits normal muscle tone. She displays no seizure activity. Coordination normal.  Skin: Skin is warm. No rash noted. She is not diaphoretic.  Psychiatric: She has a normal mood and affect.    ED Course  Procedures (including critical care time) Labs Review Labs Reviewed  CBC WITH DIFFERENTIAL  BASIC METABOLIC PANEL    Imaging  Review Dg Chest 2 View  10/29/2013   CLINICAL DATA:  Patient with multiple sclerosis. Chest pain, shortness of breath and nausea for 1 week.  EXAM: CHEST  2 VIEW  COMPARISON:  None.  FINDINGS: The heart size and mediastinal contours are within normal limits. There is no focal infiltrate, pulmonary edema, or pleural effusion. The visualized skeletal structures are stable.  IMPRESSION: No active cardiopulmonary disease.   Electronically Signed   By: Abelardo Diesel M.D.   On: 10/29/2013 16:39     EKG Interpretation None      MDM   Pt was treated prior to my arrival by the overnight MD.  Pt was given antiemetics.  Labs and xrays were pending.  Pt had an argument with her husband and decided she needed to leave the ED.  Pt understands she can return at any time.   Dorie Rank, MD 10/31/13 [phone]

## 2013-10-31 NOTE — Discharge Instructions (Signed)
Please read and follow all provided instructions.  Your diagnoses today include:  1. Bilateral low back pain without sciatica    Tests performed today include:  Vital signs - see below for your results today  Medications prescribed:   Robaxin (methocarbamol) - muscle relaxer medication  DO NOT take this medication with your prescribed Flexeril.   DO NOT drive or perform any activities that require you to be awake and alert because this medicine can make you drowsy.   Take any prescribed medications only as directed.  Home care instructions:   Follow any educational materials contained in this packet  Please rest, use ice or heat on your back for the next several days  Do not lift, push, pull anything more than 10 pounds for the next week  Follow-up instructions: Please follow-up with your primary care provider in the next 1 week for further evaluation of your symptoms.   Return instructions:  SEEK IMMEDIATE MEDICAL ATTENTION IF YOU HAVE:  New numbness, tingling, weakness, or problem with the use of your arms or legs  Severe back pain not relieved with medications  Loss control of your bowels or bladder  Increasing pain in any areas of the body (such as chest or abdominal pain)  Shortness of breath, dizziness, or fainting.   Worsening nausea (feeling sick to your stomach), vomiting, fever, or sweats  Any other emergent concerns regarding your health   Additional Information:  Your vital signs today were: BP 144/104   Pulse 110   Temp(Src) 97.9 F (36.6 C) (Oral)   Resp 22   Ht 5\' 5"  (1.651 m)   Wt 210 lb (95.255 kg)   BMI 34.95 kg/m2   SpO2 100% If your blood pressure (BP) was elevated above 135/85 this visit, please have this repeated by your doctor within one month. --------------

## 2013-10-31 NOTE — ED Notes (Signed)
Pt arguing with husband, stating she wants to leave, EDP aware, pt. Counseled to stay; husband sent to waiting room, security called and spoke with pt. As well. Pt still wants to leave. AMA signed.

## 2013-10-31 NOTE — ED Provider Notes (Signed)
MSE was initiated and I personally evaluated the patient and placed orders (if any) at  6:45 AM on October 31, 2013.  The patient appears stable so that the remainder of the MSE may be completed by another provider.  Sharyon Cable, MD 10/31/13 [phone]

## 2013-10-31 NOTE — ED Notes (Signed)
Onset of nausea 2mn, began vomiting then headache started. No relief with compazine.still nauseated

## 2013-10-31 NOTE — ED Provider Notes (Signed)
Medical screening examination/treatment/procedure(s) were performed by non-physician practitioner and as supervising physician I was immediately available for consultation/collaboration.   EKG Interpretation None        [name], MD 10/31/13 1657

## 2013-10-31 NOTE — ED Notes (Signed)
States she started seeing a new neurologist this past month,([facility]) has been taken off some medications and she hasn't been feeling well since. (unable to get full med list secondary to pt constant nausea/dry heaving).  Has been seen @ urgent care yesterday for shaking and nausea. was noted to be " hypertensive and tachycardic". rx given for compazine which she tried taking tonight with no relief. Now also has migraine h/a

## 2013-10-31 NOTE — ED Provider Notes (Signed)
CSN: [id number]     Arrival date & time 10/31/13  1301 History  This chart was scribed for Emily Cater, PA-C, working with Emily Dessert, MD by Emily [name], ED Scribe. The patient was seen in room TR09C/TR09C at 1:30 PM.    Chief Complaint  Patient presents with  . Back Pain    The history is provided by the patient. No language interpreter was used.   HPI Comments: Emily [name] is a 33 y.o. female with a medical hx of MS who presents to the Emergency Department complaining of back pain onset just PTA. She states that she sat in the car and twisted and experienced pain. She states that there is pain that goes down into her legs if she puts her head down. She states that she is not able to walk very well. She states that she has not tried any medications for her symptoms.   She denies any other symptoms. She states that she takes Compazine for her migraines. She states that she last took Morphine last week. She states that she was taking that for 2 months. She states that before that she was taking Percocet for 8 years. She denies that she has not had a withdrawal that she knows of.  She states that at [facility] this morning and was noted to be vomiting. She left AMA.   Past Medical History  Diagnosis Date  . Headache(784.0)     migraines  . Multiple sclerosis   . Asthma as a child  . GERD (gastroesophageal reflux disease)   . Urinary urgency   . Anxiety   . Perforated bowel 2009  . Migraines   . Depression   . Chronic back pain   . Chronic pain   . Ovarian cyst    Past Surgical History  Procedure Laterality Date  . Extremity cyst excision  1994    right leg  . 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 Colostomy closure  04/2007  . Scar revision  01/21/2011    Procedure: SCAR REVISION;  Surgeon: Hermelinda Dellen;  Location: [facility];  Service: Plastics;  Laterality: N/A;  exploration of scar of abdomen and repair of defect  . Hernia repair    .  Abdominal surgery    . Appendectomy     Family History  Problem Relation Age of Onset  . Diabetes Mother   . Hypertension Mother   . Diabetes Father   . Hypertension Father    History  Substance Use Topics  . Smoking status: Current Some Day Smoker -- 0.40 packs/day for 10 years    Types: Cigarettes  . Smokeless tobacco: Never Used     Comment: quit smoking 10 days ago  . Alcohol Use: Yes     Comment: Rare   OB History   Grav Para Term Preterm Abortions TAB SAB Ect Mult Living                 Review of Systems  Constitutional: Negative for fever and unexpected weight change.       Tearful in room   Gastrointestinal: Negative for constipation.       Negative for fecal incontinence.   Genitourinary: Negative for dysuria, hematuria, flank pain, vaginal bleeding, vaginal discharge and pelvic pain.       Negative for urinary incontinence or retention.  Musculoskeletal: Positive for back pain.  Neurological: Negative for weakness and numbness.       Denies  saddle paresthesias.    Allergies  Amitriptyline; Baclofen; Cymbalta; Gabapentin; Monosodium glutamate; Vicodin; Alprazolam; Magnesium salicylate; Rizatriptan; Tizanidine; Toradol; Adhesive; and Lamotrigine  Home Medications   Prior to Admission medications   Medication Sig Start Date End Date Taking? Authorizing Provider  Alum & Mag Hydroxide-Simeth (MAGIC MOUTHWASH W/LIDOCAINE) SOLN Take 5 mLs by mouth 3 (three) times daily as needed (For reflux pain). 10/24/13   Tanna Furry, MD  busPIRone (BUSPAR) 5 MG tablet Take 5 mg by mouth 3 (three) times daily.    Historical Provider, MD  carbamazepine (CARBATROL) 200 MG 12 hr capsule Take 200 mg by mouth 3 (three) times daily. 04/22/13 04/22/14  Historical Provider, MD  clonazePAM (KLONOPIN) 1 MG tablet Take 1 mg by mouth 2 (two) times daily as needed for anxiety.     Historical Provider, MD  cyclobenzaprine (FLEXERIL) 10 MG tablet Take 10 mg by mouth 3 (three) times daily as needed  for muscle spasms.    Historical Provider, MD  diphenhydrAMINE (BENADRYL) 25 MG tablet Take 50 mg by mouth daily.     Historical Provider, MD  EPINEPHrine (EPIPEN) 0.3 mg/0.3 mL SOAJ injection Inject 0.3 mg into the muscle as needed (allergic reaction).     Historical Provider, MD  Fingolimod HCl (GILENYA) 0.5 MG CAPS Take 0.5 mg by mouth daily.     Historical Provider, MD  loratadine (CLARITIN) 10 MG tablet Take 10 mg by mouth daily.     Historical Provider, MD  medroxyPROGESTERone (DEPO-PROVERA) 150 MG/ML injection Inject 150 mg into the muscle every 3 (three) months. Last injection mid September    Historical Provider, MD  omeprazole (PRILOSEC) 40 MG capsule Take 40 mg by mouth daily.     Historical Provider, MD  ondansetron (ZOFRAN) 4 MG tablet Take 1 tablet (4 mg total) by mouth every 6 (six) hours. 10/29/13   Larene Pickett, PA-C  ondansetron (ZOFRAN) 8 MG tablet Take 8 mg by mouth every 6 (six) hours as needed for nausea or vomiting.     Historical Provider, MD  potassium chloride (K-DUR) 10 MEQ tablet Take 10 mEq by mouth 2 (two) times daily.    Historical Provider, MD  pregabalin (LYRICA) 200 MG capsule Take 600 mg by mouth at bedtime.    Historical Provider, MD  promethazine (PHENERGAN) 25 MG tablet Take 25 mg by mouth every 6 (six) hours as needed for nausea or vomiting.     Historical Provider, MD  propranolol (INDERAL) 10 MG tablet Take 10 mg by mouth 2 (two) times daily.    Historical Provider, MD  ranitidine (ZANTAC) 300 MG capsule Take 300 mg by mouth at bedtime.     Historical Provider, MD  topiramate (TOPAMAX) 100 MG tablet Take 100 mg by mouth 2 (two) times daily.     Historical Provider, MD  venlafaxine (EFFEXOR) 75 MG tablet Take 75 mg by mouth 3 (three) times daily with meals.    Historical Provider, MD   BP 144/104  Pulse 110  Temp(Src) 97.9 F (36.6 C) (Oral)  Resp 22  Ht 5\' 5"  (1.651 m)  Wt 210 lb (95.255 kg)  BMI 34.95 kg/m2  SpO2 100%  Physical Exam  Nursing  note and vitals reviewed. Constitutional: She is oriented to person, place, and time. She appears well-developed and well-nourished. No distress.  HENT:  Head: Normocephalic and atraumatic.  Eyes: Conjunctivae and EOM are normal.  Neck: Normal range of motion. Neck supple. No tracheal deviation present.  Cardiovascular:  Mild tachycardia 105  Pulmonary/Chest: Effort normal. No respiratory distress.  Abdominal: Soft. There is no tenderness. There is no CVA tenderness.  Musculoskeletal: Normal range of motion.       Cervical back: She exhibits tenderness. She exhibits normal range of motion and no bony tenderness.       Thoracic back: She exhibits tenderness. She exhibits normal range of motion and no bony tenderness.       Lumbar back: She exhibits tenderness. She exhibits normal range of motion and no bony tenderness.  No step-off noted with palpation of spine. Patient winces and cries with any palpation of the paraspinous muscles of her back.   Neurological: She is alert and oriented to person, place, and time. She has normal strength and normal reflexes. No sensory deficit.  5/5 strength in entire lower extremities bilaterally. No sensation deficit.   Skin: Skin is warm and dry. No rash noted.  Psychiatric: She has a normal mood and affect. Her behavior is normal.    ED Course  Procedures (including critical care time) DIAGNOSTIC STUDIES: Oxygen Saturation is 100% on room air, normal by my interpretation.    COORDINATION OF CARE: 1:39 PM-Discussed treatment plan which includes Robaxin with pt at bedside and pt agreed to plan.   Labs Review Labs Reviewed - No data to display  Imaging Review Dg Chest 2 View  10/29/2013   CLINICAL DATA:  Patient with multiple sclerosis. Chest pain, shortness of breath and nausea for 1 week.  EXAM: CHEST  2 VIEW  COMPARISON:  None.  FINDINGS: The heart size and mediastinal contours are within normal limits. There is no focal infiltrate, pulmonary  edema, or pleural effusion. The visualized skeletal structures are stable.  IMPRESSION: No active cardiopulmonary disease.   Electronically Signed   By: Abelardo Diesel M.D.   On: 10/29/2013 16:39     EKG Interpretation None      Patient with history of chronic pain. We discussed leaving future decisions regarding narcotic medications up to her pain management doctor and we will avoid those today. Will give robaxin here.   Vital signs reviewed and are as follows: Filed Vitals:   10/31/13 1307  BP: 144/104  Pulse: 110  Temp: 97.9 F (36.6 C)  Resp: 22    No red flag s/s of low back pain. Patient was counseled on back pain precautions and told to do activity as tolerated but do not lift, push, or pull heavy objects more than 10 pounds for the next week.  Patient counseled to use ice or heat on back for no longer than 15 minutes every hour.   Patient prescribed muscle relaxer and counseled on proper use of muscle relaxant medication. Pt informed to avoid taking Flexeril which is currently on her medication list while taking the Robaxin.   Urged patient not to drink alcohol, drive, or perform any other activities that requires focus while taking this medication.  Patient urged to follow-up with PCP if pain does not improve with treatment and rest or if pain becomes recurrent. Urged to return with worsening severe pain, loss of bowel or bladder control, trouble walking.   The patient verbalizes understanding and agrees with the plan.   MDM   Final diagnoses:  Bilateral low back pain without sciatica   Patient with back pain and history of chronic pains. No neurological deficits. Patient is ambulatory. No warning symptoms of back pain including: fecal incontinence, urinary retention or overflow incontinence, night sweats, waking from sleep with back pain, unexplained fevers  or weight loss, h/o cancer, IVDU, recent trauma. No concern for cauda equina, epidural abscess, or other serious  cause of back pain. Conservative measures such as rest, ice/heat and pain medicine indicated with PCP follow-up if no improvement with conservative management.   Patient noted to be mildly tachycardic at her ED visits over the past month. She is also crying at time of exam. Do not feel this is pathologic.   I personally performed the services described in this documentation, which was scribed in my presence. The recorded information has been reviewed and is accurate.    Emily Cater, PA-C 10/31/13 1425

## 2013-11-01 ENCOUNTER — Telehealth: Payer: Self-pay | Admitting: Diagnostic Neuroimaging

## 2013-11-01 ENCOUNTER — Encounter (HOSPITAL_COMMUNITY): Payer: Self-pay | Admitting: Emergency Medicine

## 2013-11-01 ENCOUNTER — Emergency Department (HOSPITAL_COMMUNITY)
Admission: EM | Admit: 2013-11-01 | Discharge: 2013-11-01 | Disposition: A | Discharge status: Home / Self Care | Payer: Medicare Other | Attending: Emergency Medicine | Admitting: Emergency Medicine

## 2013-11-01 DIAGNOSIS — G35 Multiple sclerosis: Secondary | ICD-10-CM | POA: Diagnosis not present

## 2013-11-01 DIAGNOSIS — F329 Major depressive disorder, single episode, unspecified: Secondary | ICD-10-CM | POA: Insufficient documentation

## 2013-11-01 DIAGNOSIS — J45909 Unspecified asthma, uncomplicated: Secondary | ICD-10-CM | POA: Insufficient documentation

## 2013-11-01 DIAGNOSIS — G8929 Other chronic pain: Secondary | ICD-10-CM | POA: Insufficient documentation

## 2013-11-01 DIAGNOSIS — M549 Dorsalgia, unspecified: Secondary | ICD-10-CM | POA: Insufficient documentation

## 2013-11-01 DIAGNOSIS — Z793 Long term (current) use of hormonal contraceptives: Secondary | ICD-10-CM | POA: Insufficient documentation

## 2013-11-01 DIAGNOSIS — Z79899 Other long term (current) drug therapy: Secondary | ICD-10-CM | POA: Diagnosis not present

## 2013-11-01 DIAGNOSIS — K219 Gastro-esophageal reflux disease without esophagitis: Secondary | ICD-10-CM | POA: Diagnosis not present

## 2013-11-01 DIAGNOSIS — F419 Anxiety disorder, unspecified: Secondary | ICD-10-CM | POA: Diagnosis not present

## 2013-11-01 DIAGNOSIS — G43909 Migraine, unspecified, not intractable, without status migrainosus: Secondary | ICD-10-CM | POA: Insufficient documentation

## 2013-11-01 DIAGNOSIS — G44209 Tension-type headache, unspecified, not intractable: Secondary | ICD-10-CM

## 2013-11-01 DIAGNOSIS — Z720 Tobacco use: Secondary | ICD-10-CM | POA: Diagnosis not present

## 2013-11-01 DIAGNOSIS — Z8742 Personal history of other diseases of the female genital tract: Secondary | ICD-10-CM | POA: Diagnosis not present

## 2013-11-01 MED ORDER — CARISOPRODOL 350 MG PO TABS: 350.0000 mg | ORAL_TABLET | Freq: Three times a day (TID) | ORAL | Status: DC

## 2013-11-01 MED ORDER — CELECOXIB 100 MG PO CAPS: 100.0000 mg | ORAL_CAPSULE | Freq: Two times a day (BID) | ORAL | Status: DC

## 2013-11-01 NOTE — ED Notes (Addendum)
Migraine since 1 am Sunday. Nausea .   Seen here yesterday too and states she got upset and left and went to the urgent care.

## 2013-11-01 NOTE — Discharge Instructions (Signed)
Please see MD at the [facility] for evaluation and management of your anxiety. Use soma and celebrex for your muscle contraction headache and symptoms. See Dr Wenda Overland for follow up for continuity of your care. Tension Headache A tension headache is pain, pressure, or aching felt over the front and sides of the head. Tension headaches often come after stress, feeling worried (anxiety), or feeling sad or down for a while (depressed). HOME CARE  Only take medicine as told by your doctor.  Lie down in a dark, quiet room when you have a headache.  Keep a journal to find out if certain things bring on headaches. For example, write down:  What you eat and drink.  How much sleep you get.  Any change to your diet or medicines.  Relax by getting a massage or doing other relaxing activities.  Put ice or heat packs on the head and neck area as told by your doctor.  Lessen stress.  Sit up straight. Do not tighten (tense) your muscles.  Quit smoking if you smoke.  Lessen how much alcohol you drink.  Lessen how much caffeine you drink, or stop drinking caffeine.  Eat and exercise regularly.  Get enough sleep.  Avoid using too much pain medicine. GET HELP RIGHT AWAY IF:   Your headache becomes really bad.  You have a fever.  You have a stiff neck.  You have trouble seeing.  Your muscles are weak, or you lose muscle control.  You lose your balance or have trouble walking.  You feel like you will pass out (faint), or you pass out.  You have really bad symptoms that are different than your first symptoms.  You have problems with the medicines given to you by your doctor.  Your medicines do not work.  Your headache feels different than the other headaches.  You feel sick to your stomach (nauseous) or throw up (vomit). MAKE SURE YOU:   Understand these instructions.  Will watch your condition.  Will get help right away if you are not doing well or get worse. Document  Released: 03/20/2009 Document Revised: 03/18/2011 Document Reviewed: 12/14/2010 [facility] Patient Information 2015 [place], [place]. This information is not intended to replace advice given to you by your health care provider. Make sure you discuss any questions you have with your health care provider.

## 2013-11-01 NOTE — ED Provider Notes (Signed)
CSN: [id number]     Arrival date & time 11/01/13  0756 History   First MD Initiated Contact with Patient 11/01/13 0800     Chief Complaint  Patient presents with  . Migraine     (Consider location/radiation/quality/duration/timing/severity/associated sxs/prior Treatment) HPI Comments: Pt c/o headache from temple to temple and down the back of the neck to the shoulders. No injury or trauma. No changes in med. Hx of migraines.   Patient is a 33 y.o. female presenting with migraines. The history is provided by the patient.  Migraine This is a recurrent problem. The current episode started in the past 7 days. The problem occurs intermittently. The problem has been gradually worsening. Associated symptoms include headaches and nausea. Pertinent negatives include no abdominal pain, chills, fever, numbness, rash, vertigo or weakness. Nothing aggravates the symptoms. She has tried nothing for the symptoms. The treatment provided no relief.    Past Medical History  Diagnosis Date  . Headache(784.0)     migraines  . Multiple sclerosis   . Asthma as a child  . GERD (gastroesophageal reflux disease)   . Urinary urgency   . Anxiety   . Perforated bowel 2009  . Migraines   . Depression   . Chronic back pain   . Chronic pain   . Ovarian cyst    Past Surgical History  Procedure Laterality Date  . Extremity cyst excision  1994    right leg  . 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 Colostomy closure  04/2007  . Scar revision  01/21/2011    Procedure: SCAR REVISION;  Surgeon: Hermelinda Dellen;  Location: [facility];  Service: Plastics;  Laterality: N/A;  exploration of scar of abdomen and repair of defect  . Hernia repair    . Abdominal surgery    . Appendectomy     Family History  Problem Relation Age of Onset  . Diabetes Mother   . Hypertension Mother   . Diabetes Father   . Hypertension Father    History  Substance Use Topics  . Smoking status: Current  Some Day Smoker -- 0.40 packs/day for 10 years    Types: Cigarettes  . Smokeless tobacco: Never Used     Comment: quit smoking 10 days ago  . Alcohol Use: Yes     Comment: Rare   OB History   Grav Para Term Preterm Abortions TAB SAB Ect Mult Living                 Review of Systems  Constitutional: Negative for fever and chills.  Gastrointestinal: Positive for nausea. Negative for abdominal pain.  Musculoskeletal: Positive for back pain.  Skin: Negative for rash.  Neurological: Positive for headaches. Negative for vertigo, weakness and numbness.  Psychiatric/Behavioral: The patient is nervous/anxious.       Allergies  Amitriptyline; Baclofen; Cymbalta; Gabapentin; Monosodium glutamate; Vicodin; Alprazolam; Magnesium salicylate; Rizatriptan; Tizanidine; Toradol; Adhesive; and Lamotrigine  Home Medications   Prior to Admission medications   Medication Sig Start Date End Date Taking? Authorizing Provider  Alum & Mag Hydroxide-Simeth (MAGIC MOUTHWASH W/LIDOCAINE) SOLN Take 5 mLs by mouth 3 (three) times daily as needed (For reflux pain). 10/24/13  Yes Tanna Furry, MD  busPIRone (BUSPAR) 5 MG tablet Take 5 mg by mouth 3 (three) times daily.   Yes Historical Provider, MD  carbamazepine (CARBATROL) 200 MG 12 hr capsule Take 200 mg by mouth 3 (three) times daily. 04/22/13 04/22/14 Yes Historical Provider,  MD  clonazePAM (KLONOPIN) 1 MG tablet Take 1 mg by mouth 2 (two) times daily as needed for anxiety.    Yes Historical Provider, MD  cyclobenzaprine (FLEXERIL) 10 MG tablet Take 10 mg by mouth 3 (three) times daily as needed for muscle spasms.   Yes Historical Provider, MD  diphenhydrAMINE (BENADRYL) 25 MG tablet Take 50 mg by mouth daily.    Yes Historical Provider, MD  EPINEPHrine (EPIPEN) 0.3 mg/0.3 mL SOAJ injection Inject 0.3 mg into the muscle as needed (allergic reaction).    Yes Historical Provider, MD  Fingolimod HCl (GILENYA) 0.5 MG CAPS Take 0.5 mg by mouth daily.    Yes  Historical Provider, MD  loratadine (CLARITIN) 10 MG tablet Take 10 mg by mouth daily.    Yes Historical Provider, MD  medroxyPROGESTERone (DEPO-PROVERA) 150 MG/ML injection Inject 150 mg into the muscle every 3 (three) months. Last injection mid September   Yes Historical Provider, MD  omeprazole (PRILOSEC) 40 MG capsule Take 40 mg by mouth daily.    Yes Historical Provider, MD  ondansetron (ZOFRAN) 8 MG tablet Take 8 mg by mouth every 6 (six) hours as needed for nausea or vomiting.    Yes Historical Provider, MD  potassium chloride (K-DUR) 10 MEQ tablet Take 10 mEq by mouth 2 (two) times daily.   Yes Historical Provider, MD  pregabalin (LYRICA) 200 MG capsule Take 600 mg by mouth at bedtime.   Yes Historical Provider, MD  promethazine (PHENERGAN) 25 MG tablet Take 25 mg by mouth every 6 (six) hours as needed for nausea or vomiting.    Yes Historical Provider, MD  propranolol (INDERAL) 10 MG tablet Take 10 mg by mouth 2 (two) times daily.   Yes Historical Provider, MD  ranitidine (ZANTAC) 300 MG capsule Take 300 mg by mouth at bedtime.    Yes Historical Provider, MD  topiramate (TOPAMAX) 100 MG tablet Take 100 mg by mouth 2 (two) times daily.    Yes Historical Provider, MD  venlafaxine (EFFEXOR) 75 MG tablet Take 75 mg by mouth 3 (three) times daily with meals.   Yes Historical Provider, MD   BP 142/120  Pulse 102  Temp(Src) 98.2 F (36.8 C) (Oral)  Resp 18  Ht 5\' 5"  (1.651 m)  Wt 210 lb (95.255 kg)  BMI 34.95 kg/m2  SpO2 98% Physical Exam  Nursing note and vitals reviewed. Constitutional: She is oriented to person, place, and time. She appears well-developed and well-nourished.  Non-toxic appearance.  HENT:  Head: Normocephalic.  Right Ear: Tympanic membrane and external ear normal.  Left Ear: Tympanic membrane and external ear normal.  Eyes: EOM and lids are normal. Pupils are equal, round, and reactive to light.  Neck: Normal range of motion. Neck supple. Carotid bruit is not  present.  Cardiovascular: Normal rate, regular rhythm, normal heart sounds, intact distal pulses and normal pulses.   Pulmonary/Chest: Breath sounds normal. No respiratory distress.  Abdominal: Soft. Bowel sounds are normal. There is no tenderness. There is no guarding.  Musculoskeletal: Normal range of motion.  Lymphadenopathy:       Head (right side): No submandibular adenopathy present.       Head (left side): No submandibular adenopathy present.    She has no cervical adenopathy.  Neurological: She is alert and oriented to person, place, and time. She has normal strength. She is not disoriented. No cranial nerve deficit or sensory deficit. Coordination and gait normal. GCS eye subscore is 4. GCS verbal subscore is 5.  GCS motor subscore is 6.  Gait steady. Speech clear and understandable.  Skin: Skin is warm and dry.  Psychiatric: Her speech is normal. Her mood appears anxious.    ED Course  Procedures (including critical care time) Labs Review Labs Reviewed - No data to display  Imaging Review No results found.   EKG Interpretation None      MDM  Pt has headache pain from temporal to temporal area and down the back of the neck to the shoulders. During exam pt very anxious. No suicidal or homocidal ideas.   Pt has hx of migraines. No fever, illness, trauma, or recent medication changes.  Blood pressure 142/120 on admission, VS's otherwise wnl. No gross neuro deficits. Suspect pt has a muscle contraction component to her headaches. Pt treated with SOMA nd celebrex. Pt to follow up with primary MD.She is to return to the ED if any changes or concerns.   Final diagnoses:  Muscle contraction headache  Anxiety    **I have reviewed nursing notes, vital signs, and all appropriate lab and imaging results for this patient.Lenox Ahr, PA-C 11/05/13 [phone]

## 2013-11-01 NOTE — Telephone Encounter (Signed)
Patient calling to state that she would like a sooner appointment due to her anxiety and her migraines getting worse, also wants a referral to a pain management clinic because the one that her PCP referred her to is not taking new patients, please return call and advise.

## 2013-11-02 DIAGNOSIS — F329 Major depressive disorder, single episode, unspecified: Secondary | ICD-10-CM | POA: Insufficient documentation

## 2013-11-03 ENCOUNTER — Encounter (HOSPITAL_COMMUNITY): Payer: Self-pay | Admitting: Emergency Medicine

## 2013-11-03 ENCOUNTER — Emergency Department (HOSPITAL_COMMUNITY)
Admission: EM | Admit: 2013-11-03 | Discharge: 2013-11-03 | Disposition: A | Discharge status: Home / Self Care | Payer: Medicare Other | Attending: Emergency Medicine | Admitting: Emergency Medicine

## 2013-11-03 DIAGNOSIS — F419 Anxiety disorder, unspecified: Secondary | ICD-10-CM | POA: Diagnosis not present

## 2013-11-03 DIAGNOSIS — Z720 Tobacco use: Secondary | ICD-10-CM | POA: Diagnosis not present

## 2013-11-03 DIAGNOSIS — Z8742 Personal history of other diseases of the female genital tract: Secondary | ICD-10-CM | POA: Diagnosis not present

## 2013-11-03 DIAGNOSIS — Z79899 Other long term (current) drug therapy: Secondary | ICD-10-CM | POA: Diagnosis not present

## 2013-11-03 DIAGNOSIS — Z791 Long term (current) use of non-steroidal anti-inflammatories (NSAID): Secondary | ICD-10-CM | POA: Diagnosis not present

## 2013-11-03 DIAGNOSIS — G35 Multiple sclerosis: Secondary | ICD-10-CM | POA: Insufficient documentation

## 2013-11-03 DIAGNOSIS — K219 Gastro-esophageal reflux disease without esophagitis: Secondary | ICD-10-CM | POA: Diagnosis not present

## 2013-11-03 DIAGNOSIS — F329 Major depressive disorder, single episode, unspecified: Secondary | ICD-10-CM | POA: Diagnosis not present

## 2013-11-03 DIAGNOSIS — J45909 Unspecified asthma, uncomplicated: Secondary | ICD-10-CM | POA: Insufficient documentation

## 2013-11-03 DIAGNOSIS — G43009 Migraine without aura, not intractable, without status migrainosus: Secondary | ICD-10-CM | POA: Insufficient documentation

## 2013-11-03 DIAGNOSIS — G8929 Other chronic pain: Secondary | ICD-10-CM | POA: Diagnosis not present

## 2013-11-03 DIAGNOSIS — G43909 Migraine, unspecified, not intractable, without status migrainosus: Secondary | ICD-10-CM | POA: Diagnosis present

## 2013-11-03 MED ORDER — SODIUM CHLORIDE 0.9 % IV BOLUS (SEPSIS)
1000.0000 mL | Freq: Once | INTRAVENOUS | Status: AC
  Administered 2013-11-03: 1000 mL via INTRAVENOUS

## 2013-11-03 MED ORDER — PROCHLORPERAZINE EDISYLATE 5 MG/ML IJ SOLN
10.0000 mg | Freq: Once | INTRAMUSCULAR | Status: AC
  Administered 2013-11-03: 10 mg via INTRAVENOUS
  Filled 2013-11-03: qty 2

## 2013-11-03 MED ORDER — DEXAMETHASONE SODIUM PHOSPHATE 4 MG/ML IJ SOLN
10.0000 mg | Freq: Once | INTRAMUSCULAR | Status: AC
  Administered 2013-11-03: 10 mg via INTRAVENOUS
  Filled 2013-11-03: qty 3

## 2013-11-03 MED ORDER — DIPHENHYDRAMINE HCL 50 MG/ML IJ SOLN
25.0000 mg | Freq: Once | INTRAMUSCULAR | Status: AC
  Administered 2013-11-03: 25 mg via INTRAVENOUS
  Filled 2013-11-03: qty 1

## 2013-11-03 NOTE — ED Provider Notes (Signed)
CSN: [id number]     Arrival date & time 11/03/13  0053 History   First MD Initiated Contact with Patient 11/03/13 0145     Chief Complaint  Patient presents with  . Migraine     (Consider location/radiation/quality/duration/timing/severity/associated sxs/prior Treatment) HPI  This is a 33 year old female with a history of MS and migraine headaches who presents with a headache. Patient reports onset of headache approximately one week ago. It has waxed and waned and she thought "it was getting better but now it is worse." She reports that it is frontal and 10 out of 10. She takes Topamax, carbamazepine, and Lyrica at home but has not gotten any relief. She denies any neck pain or fevers. Pain is typical of her migraines. Denies any vision changes, new weakness, numbness, or tingling.  Past Medical History  Diagnosis Date  . Headache(784.0)     migraines  . Multiple sclerosis   . Asthma as a child  . GERD (gastroesophageal reflux disease)   . Urinary urgency   . Anxiety   . Perforated bowel 2009  . Migraines   . Depression   . Chronic back pain   . Chronic pain   . Ovarian cyst    Past Surgical History  Procedure Laterality Date  . Extremity cyst excision  1994    right leg  . 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 Colostomy closure  04/2007  . Scar revision  01/21/2011    Procedure: SCAR REVISION;  Surgeon: Hermelinda Dellen;  Location: [facility];  Service: Plastics;  Laterality: N/A;  exploration of scar of abdomen and repair of defect  . Hernia repair    . Abdominal surgery    . Appendectomy     Family History  Problem Relation Age of Onset  . Diabetes Mother   . Hypertension Mother   . Diabetes Father   . Hypertension Father    History  Substance Use Topics  . Smoking status: Current Some Day Smoker -- 0.40 packs/day for 10 years    Types: Cigarettes  . Smokeless tobacco: Never Used     Comment: quit smoking 10 days ago  . Alcohol Use:  Yes     Comment: Rare   OB History   Grav Para Term Preterm Abortions TAB SAB Ect Mult Living                 Review of Systems  Constitutional: Negative for fever.  Eyes: Positive for photophobia and visual disturbance.  Respiratory: Negative for chest tightness and shortness of breath.   Cardiovascular: Negative for chest pain.  Gastrointestinal: Negative for nausea, vomiting and abdominal pain.  Neurological: Positive for headaches. Negative for dizziness, speech difficulty, weakness, light-headedness and numbness.  All other systems reviewed and are negative.     Allergies  Amitriptyline; Baclofen; Cymbalta; Gabapentin; Monosodium glutamate; Vicodin; Alprazolam; Magnesium salicylate; Rizatriptan; Tizanidine; Toradol; Adhesive; and Lamotrigine  Home Medications   Prior to Admission medications   Medication Sig Start Date End Date Taking? Authorizing Provider  Alum & Mag Hydroxide-Simeth (MAGIC MOUTHWASH W/LIDOCAINE) SOLN Take 5 mLs by mouth 3 (three) times daily as needed (For reflux pain). 10/24/13   Tanna Furry, MD  busPIRone (BUSPAR) 5 MG tablet Take 5 mg by mouth 3 (three) times daily.    Historical Provider, MD  carbamazepine (CARBATROL) 200 MG 12 hr capsule Take 200 mg by mouth 3 (three) times daily. 04/22/13 04/22/14  Historical Provider, MD  carisoprodol (  SOMA) 350 MG tablet Take 1 tablet (350 mg total) by mouth 3 (three) times daily. 11/01/13   Lenox Ahr, PA-C  celecoxib (CELEBREX) 100 MG capsule Take 1 capsule (100 mg total) by mouth 2 (two) times daily. 11/01/13   Lenox Ahr, PA-C  clonazePAM (KLONOPIN) 1 MG tablet Take 1 mg by mouth 2 (two) times daily as needed for anxiety.     Historical Provider, MD  cyclobenzaprine (FLEXERIL) 10 MG tablet Take 10 mg by mouth 3 (three) times daily as needed for muscle spasms.    Historical Provider, MD  diphenhydrAMINE (BENADRYL) 25 MG tablet Take 50 mg by mouth daily.     Historical Provider, MD  EPINEPHrine (EPIPEN) 0.3  mg/0.3 mL SOAJ injection Inject 0.3 mg into the muscle as needed (allergic reaction).     Historical Provider, MD  Fingolimod HCl (GILENYA) 0.5 MG CAPS Take 0.5 mg by mouth daily.     Historical Provider, MD  loratadine (CLARITIN) 10 MG tablet Take 10 mg by mouth daily.     Historical Provider, MD  medroxyPROGESTERone (DEPO-PROVERA) 150 MG/ML injection Inject 150 mg into the muscle every 3 (three) months. Last injection mid September    Historical Provider, MD  omeprazole (PRILOSEC) 40 MG capsule Take 40 mg by mouth daily.     Historical Provider, MD  ondansetron (ZOFRAN) 8 MG tablet Take 8 mg by mouth every 6 (six) hours as needed for nausea or vomiting.     Historical Provider, MD  potassium chloride (K-DUR) 10 MEQ tablet Take 10 mEq by mouth 2 (two) times daily.    Historical Provider, MD  pregabalin (LYRICA) 200 MG capsule Take 600 mg by mouth at bedtime.    Historical Provider, MD  promethazine (PHENERGAN) 25 MG tablet Take 25 mg by mouth every 6 (six) hours as needed for nausea or vomiting.     Historical Provider, MD  propranolol (INDERAL) 10 MG tablet Take 10 mg by mouth 2 (two) times daily.    Historical Provider, MD  ranitidine (ZANTAC) 300 MG capsule Take 300 mg by mouth at bedtime.     Historical Provider, MD  topiramate (TOPAMAX) 100 MG tablet Take 100 mg by mouth 2 (two) times daily.     Historical Provider, MD  venlafaxine (EFFEXOR) 75 MG tablet Take 75 mg by mouth 3 (three) times daily with meals.    Historical Provider, MD   BP 148/111  Pulse 119  Temp(Src) 98.1 F (36.7 C) (Oral)  Resp 20  Ht 5\' 5"  (1.651 m)  Wt 210 lb (95.255 kg)  BMI 34.95 kg/m2  SpO2 99% Physical Exam  Nursing note and vitals reviewed. Constitutional: She is oriented to person, place, and time. She appears well-developed and well-nourished.  Uncomfortable appearing  HENT:  Head: Normocephalic and atraumatic.  Mouth/Throat: Oropharynx is clear and moist.  Eyes: EOM are normal. Pupils are equal,  round, and reactive to light.  Neck: Neck supple.  Cardiovascular: Normal rate, regular rhythm and normal heart sounds.   Pulmonary/Chest: Effort normal. No respiratory distress. She has no wheezes.  Abdominal: Soft. Bowel sounds are normal.  Neurological: She is alert and oriented to person, place, and time. No cranial nerve deficit.  Skin: Skin is warm and dry.  Psychiatric: She has a normal mood and affect.    ED Course  Procedures (including critical care time) Labs Review Labs Reviewed - No data to display  Imaging Review No results found.   EKG Interpretation None  MDM   Final diagnoses:  Migraine without aura and without status migrainosus, not intractable     The patient presents with headache consistent with prior migraines. Her medications are not working. She is uncomfortable appearing but nontoxic on exam. Patient given migraine cocktail. Reports improvement of symptoms. Low suspicion at this time for acute bleed, subarachnoid hemorrhage, or infection. Patient to follow-up with neurologist as an outpatient.  After history, exam, and medical workup I feel the patient has been appropriately medically screened and is safe for discharge home. Pertinent diagnoses were discussed with the patient. Patient was given return precautions.     Merryl Hacker, MD 11/03/13 [phone]

## 2013-11-03 NOTE — ED Notes (Signed)
Migraine since last Tuesday per pt. Thought it was going to get better and it got worse per pt. Nausea, no vomiting at this time.

## 2013-11-03 NOTE — Discharge Instructions (Signed)

## 2013-11-04 NOTE — Telephone Encounter (Signed)
Called patient. Left vmail. Will schedule earlier appt with Dr. Leta Baptist if needed.  Pt will need to follow up with PCP to change pain management clinic.

## 2013-11-05 NOTE — ED Provider Notes (Signed)
Medical screening examination/treatment/procedure(s) were performed by non-physician practitioner and as supervising physician I was immediately available for consultation/collaboration.   EKG Interpretation None        [place], DO 11/05/13 2325

## 2013-11-06 ENCOUNTER — Encounter (HOSPITAL_COMMUNITY): Payer: Self-pay | Admitting: Emergency Medicine

## 2013-11-06 ENCOUNTER — Emergency Department (HOSPITAL_COMMUNITY)
Admission: EM | Admit: 2013-11-06 | Discharge: 2013-11-06 | Discharge status: Against Medical Advice | Payer: Medicare Other | Attending: Emergency Medicine | Admitting: Emergency Medicine

## 2013-11-06 DIAGNOSIS — R109 Unspecified abdominal pain: Secondary | ICD-10-CM | POA: Diagnosis not present

## 2013-11-06 DIAGNOSIS — G8929 Other chronic pain: Secondary | ICD-10-CM | POA: Insufficient documentation

## 2013-11-06 DIAGNOSIS — Z720 Tobacco use: Secondary | ICD-10-CM | POA: Diagnosis not present

## 2013-11-06 DIAGNOSIS — J45909 Unspecified asthma, uncomplicated: Secondary | ICD-10-CM | POA: Insufficient documentation

## 2013-11-06 DIAGNOSIS — R110 Nausea: Secondary | ICD-10-CM | POA: Diagnosis not present

## 2013-11-06 LAB — COMPREHENSIVE METABOLIC PANEL
ALBUMIN: 3.2 g/dL — AB (ref 3.5–5.2)
ALT: 14 U/L (ref 0–35)
AST: 15 U/L (ref 0–37)
Alkaline Phosphatase: 70 U/L (ref 39–117)
Anion gap: 13 (ref 5–15)
BILIRUBIN TOTAL: 0.2 mg/dL — AB (ref 0.3–1.2)
BUN: 15 mg/dL (ref 6–23)
CHLORIDE: 106 meq/L (ref 96–112)
CO2: 24 mEq/L (ref 19–32)
CREATININE: 0.69 mg/dL (ref 0.50–1.10)
Calcium: 8.5 mg/dL (ref 8.4–10.5)
GFR calc Af Amer: >90 mL/min (ref >90)
GFR calc non Af Amer: >90 mL/min (ref >90)
Glucose, Bld: 75 mg/dL (ref 70–99)
Potassium: 3.8 mEq/L (ref 3.7–5.3)
SODIUM: 143 meq/L (ref 137–147)
Total Protein: 6.5 g/dL (ref 6.0–8.3)

## 2013-11-06 LAB — URINALYSIS, ROUTINE W REFLEX MICROSCOPIC
BILIRUBIN URINE: NEGATIVE
Glucose, UA: NEGATIVE mg/dL
Hgb urine dipstick: NEGATIVE
Ketones, ur: NEGATIVE mg/dL
LEUKOCYTES UA: NEGATIVE
NITRITE: NEGATIVE
PH: 6.5 (ref 5.0–8.0)
Protein, ur: NEGATIVE mg/dL
Specific Gravity, Urine: 1.027 (ref 1.005–1.030)
Urobilinogen, UA: 0.2 mg/dL (ref 0.0–1.0)

## 2013-11-06 LAB — CBC WITH DIFFERENTIAL/PLATELET
BASOS PCT: 1 % (ref 0–1)
Basophils Absolute: 0.1 10*3/uL (ref 0.0–0.1)
Eosinophils Absolute: 0.1 10*3/uL (ref 0.0–0.7)
Eosinophils Relative: 1 % (ref 0–5)
HEMATOCRIT: 38 % (ref 36.0–46.0)
Hemoglobin: 12.8 g/dL (ref 12.0–15.0)
Lymphocytes Relative: 28 % (ref 12–46)
Lymphs Abs: 2 10*3/uL (ref 0.7–4.0)
MCH: 30.6 pg (ref 26.0–34.0)
MCHC: 33.7 g/dL (ref 30.0–36.0)
MCV: 90.9 fL (ref 78.0–100.0)
MONO ABS: 0.5 10*3/uL (ref 0.1–1.0)
Monocytes Relative: 7 % (ref 3–12)
NEUTROS ABS: 4.5 10*3/uL (ref 1.7–7.7)
NEUTROS PCT: 63 % (ref 43–77)
Platelets: 300 10*3/uL (ref 150–400)
RBC: 4.18 MIL/uL (ref 3.87–5.11)
RDW: 13.8 % (ref 11.5–15.5)
WBC: 7.2 10*3/uL (ref 4.0–10.5)

## 2013-11-06 LAB — LIPASE, BLOOD: LIPASE: 23 U/L (ref 11–59)

## 2013-11-06 NOTE — ED Notes (Signed)
The pt also has ms

## 2013-11-06 NOTE — ED Notes (Signed)
The pt is c/o abd pain for 3 days.  She also reports some indigestion.  No bm  For 3 days.  Nausea.  lmp  Injection none

## 2013-11-06 NOTE — ED Notes (Signed)
Pt states her ride is coming to get her now and she can not wait any longer. Encouraged pt to stay and informed her she will be going back next but she can not wait

## 2013-11-08 ENCOUNTER — Encounter (HOSPITAL_COMMUNITY): Payer: Self-pay | Admitting: *Deleted

## 2013-11-08 ENCOUNTER — Emergency Department (HOSPITAL_COMMUNITY)
Admission: EM | Admit: 2013-11-08 | Discharge: 2013-11-08 | Disposition: A | Discharge status: Home / Self Care | Payer: Medicare Other | Attending: Emergency Medicine | Admitting: Emergency Medicine

## 2013-11-08 DIAGNOSIS — F419 Anxiety disorder, unspecified: Secondary | ICD-10-CM | POA: Diagnosis not present

## 2013-11-08 DIAGNOSIS — Z79899 Other long term (current) drug therapy: Secondary | ICD-10-CM | POA: Insufficient documentation

## 2013-11-08 DIAGNOSIS — Z720 Tobacco use: Secondary | ICD-10-CM | POA: Diagnosis not present

## 2013-11-08 DIAGNOSIS — M545 Low back pain, unspecified: Secondary | ICD-10-CM

## 2013-11-08 DIAGNOSIS — Z8742 Personal history of other diseases of the female genital tract: Secondary | ICD-10-CM | POA: Insufficient documentation

## 2013-11-08 DIAGNOSIS — F329 Major depressive disorder, single episode, unspecified: Secondary | ICD-10-CM | POA: Insufficient documentation

## 2013-11-08 DIAGNOSIS — J45909 Unspecified asthma, uncomplicated: Secondary | ICD-10-CM | POA: Diagnosis not present

## 2013-11-08 DIAGNOSIS — G43909 Migraine, unspecified, not intractable, without status migrainosus: Secondary | ICD-10-CM | POA: Insufficient documentation

## 2013-11-08 DIAGNOSIS — G8929 Other chronic pain: Secondary | ICD-10-CM | POA: Diagnosis not present

## 2013-11-08 DIAGNOSIS — G35 Multiple sclerosis: Secondary | ICD-10-CM | POA: Insufficient documentation

## 2013-11-08 DIAGNOSIS — K219 Gastro-esophageal reflux disease without esophagitis: Secondary | ICD-10-CM | POA: Insufficient documentation

## 2013-11-08 DIAGNOSIS — M546 Pain in thoracic spine: Secondary | ICD-10-CM

## 2013-11-08 DIAGNOSIS — M549 Dorsalgia, unspecified: Secondary | ICD-10-CM | POA: Diagnosis not present

## 2013-11-08 MED ORDER — HYDROMORPHONE HCL 2 MG/ML IJ SOLN
2.0000 mg | Freq: Once | INTRAMUSCULAR | Status: AC
  Administered 2013-11-08: 2 mg via INTRAMUSCULAR
  Filled 2013-11-08: qty 1

## 2013-11-08 NOTE — ED Provider Notes (Signed)
CSN: [id number]     Arrival date & time 11/08/13  1808 History  This chart was scribed for non-physician practitioner Antony Odea, PA-C working with Maudry Diego, MD by Rayfield Citizen, ED Scribe. This patient was seen in room APFT23/APFT23 and the patient's care was started at 7:06 PM.   Chief Complaint  Patient presents with  . Back Pain   The history is provided by the patient. No language interpreter was used.     HPI Comments: Tuesday L Falin is a 33 y.o. female with past medical history of multiple sclerosis and arthritis in her lower back who presents to the Emergency Department complaining of 2 days of lower back pain. She reports that she has been confused today; she denies any recent falls or injuries. She believes that her pain may be related to a strain during lifting recently.  Dr. Wenda Overland is her PCP; he does not have any openings for two weeks.   Patient takes Soma for pain; morphine sulfate, clonazepam, cyclobenzaprine. She reports that she is compliant with her medications at this time. She took her morphine today.   Past Medical History  Diagnosis Date  . Headache(784.0)     migraines  . Multiple sclerosis   . Asthma as a child  . GERD (gastroesophageal reflux disease)   . Urinary urgency   . Anxiety   . Perforated bowel 2009  . Migraines   . Depression   . Chronic back pain   . Chronic pain   . Ovarian cyst    Past Surgical History  Procedure Laterality Date  . Extremity cyst excision  1994    right leg  . 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 Colostomy closure  04/2007  . Scar revision  01/21/2011    Procedure: SCAR REVISION;  Surgeon: Hermelinda Dellen;  Location: [facility];  Service: Plastics;  Laterality: N/A;  exploration of scar of abdomen and repair of defect  . Hernia repair    . Abdominal surgery    . Appendectomy     Family History  Problem Relation Age of Onset  . Diabetes Mother   . Hypertension Mother   .  Diabetes Father   . Hypertension Father    History  Substance Use Topics  . Smoking status: Current Some Day Smoker -- 0.40 packs/day for 10 years    Types: Cigarettes  . Smokeless tobacco: Never Used     Comment: quit smoking 10 days ago  . Alcohol Use: Yes     Comment: Rare   OB History    No data available     Review of Systems  Musculoskeletal: Positive for back pain.  All other systems reviewed and are negative.  Allergies  Amitriptyline; Baclofen; Cymbalta; Gabapentin; Monosodium glutamate; Vicodin; Alprazolam; Magnesium salicylate; Rizatriptan; Tizanidine; Toradol; Adhesive; and Lamotrigine  Home Medications   Prior to Admission medications   Medication Sig Start Date End Date Taking? Authorizing Provider  Alum & Mag Hydroxide-Simeth (MAGIC MOUTHWASH W/LIDOCAINE) SOLN Take 5 mLs by mouth 3 (three) times daily as needed (For reflux pain). 10/24/13   Tanna Furry, MD  busPIRone (BUSPAR) 5 MG tablet Take 5 mg by mouth 3 (three) times daily.    Historical Provider, MD  carbamazepine (CARBATROL) 200 MG 12 hr capsule Take 200 mg by mouth 3 (three) times daily. 04/22/13 04/22/14  Historical Provider, MD  carisoprodol (SOMA) 350 MG tablet Take 1 tablet (350 mg total) by mouth 3 (  three) times daily. 11/01/13   Lenox Ahr, PA-C  celecoxib (CELEBREX) 100 MG capsule Take 1 capsule (100 mg total) by mouth 2 (two) times daily. 11/01/13   Lenox Ahr, PA-C  clonazePAM (KLONOPIN) 1 MG tablet Take 1 mg by mouth 2 (two) times daily as needed for anxiety.     Historical Provider, MD  cyclobenzaprine (FLEXERIL) 10 MG tablet Take 10 mg by mouth 3 (three) times daily as needed for muscle spasms.    Historical Provider, MD  diphenhydrAMINE (BENADRYL) 25 MG tablet Take 50 mg by mouth daily.     Historical Provider, MD  EPINEPHrine (EPIPEN) 0.3 mg/0.3 mL SOAJ injection Inject 0.3 mg into the muscle as needed (allergic reaction).     Historical Provider, MD  Fingolimod HCl (GILENYA) 0.5 MG CAPS  Take 0.5 mg by mouth daily.     Historical Provider, MD  loratadine (CLARITIN) 10 MG tablet Take 10 mg by mouth daily.     Historical Provider, MD  medroxyPROGESTERone (DEPO-PROVERA) 150 MG/ML injection Inject 150 mg into the muscle every 3 (three) months. Last injection mid September    Historical Provider, MD  omeprazole (PRILOSEC) 40 MG capsule Take 40 mg by mouth daily.     Historical Provider, MD  ondansetron (ZOFRAN) 8 MG tablet Take 8 mg by mouth every 6 (six) hours as needed for nausea or vomiting.     Historical Provider, MD  potassium chloride (K-DUR) 10 MEQ tablet Take 10 mEq by mouth 2 (two) times daily.    Historical Provider, MD  pregabalin (LYRICA) 200 MG capsule Take 600 mg by mouth at bedtime.    Historical Provider, MD  promethazine (PHENERGAN) 25 MG tablet Take 25 mg by mouth every 6 (six) hours as needed for nausea or vomiting.     Historical Provider, MD  propranolol (INDERAL) 10 MG tablet Take 10 mg by mouth 2 (two) times daily.    Historical Provider, MD  ranitidine (ZANTAC) 300 MG capsule Take 300 mg by mouth at bedtime.     Historical Provider, MD  topiramate (TOPAMAX) 100 MG tablet Take 100 mg by mouth 2 (two) times daily.     Historical Provider, MD  venlafaxine (EFFEXOR) 75 MG tablet Take 75 mg by mouth 3 (three) times daily with meals.    Historical Provider, MD   BP 140/91 mmHg  Pulse 109  Temp(Src) 98.2 F (36.8 C) (Oral)  Resp 18  Ht 5\' 5"  (1.651 m)  Wt 209 lb (94.802 kg)  BMI 34.78 kg/m2  SpO2 100% Physical Exam  Constitutional: She is oriented to person, place, and time. She appears well-developed and well-nourished.  HENT:  Head: Normocephalic and atraumatic.  Neck: No tracheal deviation present.  Cardiovascular: Normal rate.   Pulmonary/Chest: Effort normal.  Musculoskeletal:  Diffuse tenderness over lumbar spine; no bruising, no swelling  Neurological: She is alert and oriented to person, place, and time.  Skin: Skin is warm and dry.  Psychiatric:  She has a normal mood and affect. Her behavior is normal.  Nursing note and vitals reviewed.   ED Course  Procedures   DIAGNOSTIC STUDIES: Oxygen Saturation is 100% on RA, normal by my interpretation.    COORDINATION OF CARE: 7:12 PM Discussed treatment plan with pt at bedside and pt agreed to plan.   Labs Review Labs Reviewed - No data to display  Imaging Review No results found.   EKG Interpretation None      MDM  Pt has chronic pain.  Multiple ED visits.  Pt is on multiple medication.   I will give injection for acute pain.   Pt advised to resume home medications   Final diagnoses:  Back pain of thoracolumbar region     I personally performed the services in this documentation, which was scribed in my presence.  The recorded information has been reviewed and considered.   Ronnald Collum.     [place], PA-C 11/08/13 2306

## 2013-11-08 NOTE — ED Notes (Signed)
Back pain for 2 days,

## 2013-11-08 NOTE — Discharge Instructions (Signed)
Thoracic Strain You have injured the muscles or tendons that attach to the upper part of your back behind your chest. This injury is called a thoracic strain, thoracic sprain, or mid-back strain.  CAUSES  The cause of thoracic strain varies. A less severe injury involves pulling a muscle or tendon without tearing it. A more severe injury involves tearing (rupturing) a muscle or tendon. With less severe injuries, there may be little loss of strength. Sometimes, there are breaks (fractures) in the bones to which the muscles are attached. These fractures are rare, unless there was a direct hit (trauma) or you have weak bones due to osteoporosis or age. Longstanding strains may be caused by overuse or improper form during certain movements. Obesity can also increase your risk for back injuries. Sudden strains may occur due to injury or not warming up properly before exercise. Often, there is no obvious cause for a thoracic strain. SYMPTOMS  The main symptom is pain, especially with movement, such as during exercise. DIAGNOSIS  Your caregiver can usually tell what is wrong by taking an X-ray and doing a physical exam. TREATMENT   Physical therapy may be helpful for recovery. Your caregiver can give you exercises to do or refer you to a physical therapist after your pain improves.  After your pain improves, strengthening and conditioning programs appropriate for your sport or occupation may be helpful.  Always warm up before physical activities or athletics. Stretching after physical activity may also help.  Certain over-the-counter medicines may also help. Ask your caregiver if there are medicines that would help you. If this is your first thoracic strain injury, proper care and proper healing time before starting activities should prevent long-term problems. Torn ligaments and tendons require as long to heal as broken bones. Average healing times may be only 1 week for a mild strain. For torn muscles  and tendons, healing time may be up to 6 weeks to 2 months. HOME CARE INSTRUCTIONS   Apply ice to the injured area. Ice massages may also be used as directed.  Put ice in a plastic bag.  Place a towel between your skin and the bag.  Leave the ice on for 15-20 minutes, 03-04 times a day, for the first 2 days.  Only take over-the-counter or prescription medicines for pain, discomfort, or fever as directed by your caregiver.  Keep your appointments for physical therapy if this was prescribed.  Use wraps and back braces as instructed. SEEK IMMEDIATE MEDICAL CARE IF:   You have an increase in bruising, swelling, or pain.  Your pain has not improved with medicines.  You develop new shortness of breath, chest pain, or fever.  Problems seem to be getting worse rather than better. MAKE SURE YOU:   Understand these instructions.  Will watch your condition.  Will get help right away if you are not doing well or get worse. Document Released: 03/16/2003 Document Revised: 03/18/2011 Document Reviewed: 02/09/2010 Exit[employer] Patient Information 2015 [employer]. This information is not intended to replace advice given to you by your health care provider. Make sure you discuss any questions you have with your health care provider.  

## 2013-11-14 ENCOUNTER — Emergency Department (HOSPITAL_COMMUNITY)
Admission: EM | Admit: 2013-11-14 | Discharge: 2013-11-14 | Disposition: A | Discharge status: Home / Self Care | Payer: Medicare Other | Attending: Emergency Medicine | Admitting: Emergency Medicine

## 2013-11-14 ENCOUNTER — Encounter (HOSPITAL_COMMUNITY): Payer: Self-pay | Admitting: Family Medicine

## 2013-11-14 DIAGNOSIS — Z720 Tobacco use: Secondary | ICD-10-CM | POA: Insufficient documentation

## 2013-11-14 DIAGNOSIS — J45909 Unspecified asthma, uncomplicated: Secondary | ICD-10-CM | POA: Diagnosis not present

## 2013-11-14 DIAGNOSIS — Z791 Long term (current) use of non-steroidal anti-inflammatories (NSAID): Secondary | ICD-10-CM | POA: Diagnosis not present

## 2013-11-14 DIAGNOSIS — G8929 Other chronic pain: Secondary | ICD-10-CM | POA: Diagnosis not present

## 2013-11-14 DIAGNOSIS — F329 Major depressive disorder, single episode, unspecified: Secondary | ICD-10-CM | POA: Insufficient documentation

## 2013-11-14 DIAGNOSIS — Z8742 Personal history of other diseases of the female genital tract: Secondary | ICD-10-CM | POA: Insufficient documentation

## 2013-11-14 DIAGNOSIS — Z79899 Other long term (current) drug therapy: Secondary | ICD-10-CM | POA: Diagnosis not present

## 2013-11-14 DIAGNOSIS — F419 Anxiety disorder, unspecified: Secondary | ICD-10-CM | POA: Insufficient documentation

## 2013-11-14 DIAGNOSIS — G35 Multiple sclerosis: Secondary | ICD-10-CM | POA: Insufficient documentation

## 2013-11-14 DIAGNOSIS — G43909 Migraine, unspecified, not intractable, without status migrainosus: Secondary | ICD-10-CM | POA: Diagnosis not present

## 2013-11-14 DIAGNOSIS — R51 Headache: Secondary | ICD-10-CM | POA: Diagnosis present

## 2013-11-14 DIAGNOSIS — K219 Gastro-esophageal reflux disease without esophagitis: Secondary | ICD-10-CM | POA: Diagnosis not present

## 2013-11-14 MED ORDER — DEXAMETHASONE SODIUM PHOSPHATE 10 MG/ML IJ SOLN
10.0000 mg | Freq: Once | INTRAMUSCULAR | Status: AC
  Administered 2013-11-14: 10 mg via INTRAVENOUS
  Filled 2013-11-14: qty 1

## 2013-11-14 MED ORDER — SODIUM CHLORIDE 0.9 % IV BOLUS (SEPSIS)
1000.0000 mL | Freq: Once | INTRAVENOUS | Status: AC
  Administered 2013-11-14: 1000 mL via INTRAVENOUS

## 2013-11-14 MED ORDER — PROCHLORPERAZINE EDISYLATE 5 MG/ML IJ SOLN
10.0000 mg | Freq: Once | INTRAMUSCULAR | Status: AC
Start: 2013-11-14 — End: 2013-11-14
  Administered 2013-11-14: 10 mg via INTRAVENOUS
  Filled 2013-11-14: qty 2

## 2013-11-14 MED ORDER — DIPHENHYDRAMINE HCL 50 MG/ML IJ SOLN
25.0000 mg | Freq: Once | INTRAMUSCULAR | Status: AC
  Administered 2013-11-14: 25 mg via INTRAVENOUS
  Filled 2013-11-14: qty 1

## 2013-11-14 NOTE — Discharge Instructions (Signed)
Read the information below.  You may return to the Emergency Department at any time for worsening condition or any new symptoms that concern you.   RETURN IMMEDIATELY IF you develop a sudden, severe headache or confusion, become poorly responsive or faint, develop a fever above 100.104F or problem breathing, have a change in speech, vision, swallowing, or understanding, or develop new weakness, numbness, tingling, incoordination, or have a seizure.   Migraine Headache A migraine headache is an intense, throbbing pain on one or both sides of your head. A migraine can last for 30 minutes to several hours. CAUSES  The exact cause of a migraine headache is not always known. However, a migraine may be caused when nerves in the brain become irritated and release chemicals that cause inflammation. This causes pain. Certain things may also trigger migraines, such as:  Alcohol.  Smoking.  Stress.  Menstruation.  Aged cheeses.  Foods or drinks that contain nitrates, glutamate, aspartame, or tyramine.  Lack of sleep.  Chocolate.  Caffeine.  Hunger.  Physical exertion.  Fatigue.  Medicines used to treat chest pain (nitroglycerine), birth control pills, estrogen, and some blood pressure medicines. SIGNS AND SYMPTOMS  Pain on one or both sides of your head.  Pulsating or throbbing pain.  Severe pain that prevents daily activities.  Pain that is aggravated by any physical activity.  Nausea, vomiting, or both.  Dizziness.  Pain with exposure to bright lights, loud noises, or activity.  General sensitivity to bright lights, loud noises, or smells. Before you get a migraine, you may get warning signs that a migraine is coming (aura). An aura may include:  Seeing flashing lights.  Seeing bright spots, halos, or zigzag lines.  Having tunnel vision or blurred vision.  Having feelings of numbness or tingling.  Having trouble talking.  Having muscle weakness. DIAGNOSIS  A  migraine headache is often diagnosed based on:  Symptoms.  Physical exam.  A CT scan or MRI of your head. These imaging tests cannot diagnose migraines, but they can help rule out other causes of headaches. TREATMENT Medicines may be given for pain and nausea. Medicines can also be given to help prevent recurrent migraines.  HOME CARE INSTRUCTIONS  Only take over-the-counter or prescription medicines for pain or discomfort as directed by your health care provider. The use of long-term narcotics is not recommended.  Lie down in a dark, quiet room when you have a migraine.  Keep a journal to find out what may trigger your migraine headaches. For example, write down:  What you eat and drink.  How much sleep you get.  Any change to your diet or medicines.  Limit alcohol consumption.  Quit smoking if you smoke.  Get 7-9 hours of sleep, or as recommended by your health care provider.  Limit stress.  Keep lights dim if bright lights bother you and make your migraines worse. SEEK IMMEDIATE MEDICAL CARE IF:   Your migraine becomes severe.  You have a fever.  You have a stiff neck.  You have vision loss.  You have muscular weakness or loss of muscle control.  You start losing your balance or have trouble walking.  You feel faint or pass out.  You have severe symptoms that are different from your first symptoms. MAKE SURE YOU:   Understand these instructions.  Will watch your condition.  Will get help right away if you are not doing well or get worse. Document Released: 12/24/2004 Document Revised: 05/10/2013 Document Reviewed: 08/31/2012 ExitCare Patient  Information 2015 [place], [place]. This information is not intended to replace advice given to you by your health care provider. Make sure you discuss any questions you have with your health care provider.

## 2013-11-14 NOTE — ED Notes (Signed)
Pt comfortable with discharge and follow up instructions. No prescriptions. 

## 2013-11-14 NOTE — ED Notes (Signed)
Pt presents from home with c/o migraine and nausea/vomiting that began last night around midnight. Pt has hx migraines and has taken her home medications without relief.  Pt reports 3 episodes of emesis, constant nausea, and light sensitivity. Pt A&Ox4 in NAD.

## 2013-11-14 NOTE — ED Provider Notes (Signed)
CSN: [id number]     Arrival date & time 11/14/13  1024 History   First MD Initiated Contact with Patient 11/14/13 1033     Chief Complaint  Patient presents with  . Headache  . Emesis     (Consider location/radiation/quality/duration/timing/severity/associated sxs/prior Treatment) HPI   Pt with hx migraines presents with her typical migraine headache.  Pain is located left side of head and left eye, is throbbing/pounding, rated 10/10.  Associated light sensitivity, lightheadedness, nausea/vomiting.  Began last night.  This is very typical of her migraines.  Has taken home medications without relief.  Denies fevers, recent illness, head trauma, neck pain or stiffness, "worst" headache of life, sudden onset or "thunderclap" headache.  Denies focal neurologic deficits.  Is followed by her MS specialist and has recently started seeing Dr Leta Baptist (neurology) for migraine treatment.    Past Medical History  Diagnosis Date  . Headache(784.0)     migraines  . Multiple sclerosis   . Asthma as a child  . GERD (gastroesophageal reflux disease)   . Urinary urgency   . Anxiety   . Perforated bowel 2009  . Migraines   . Depression   . Chronic back pain   . Chronic pain   . Ovarian cyst    Past Surgical History  Procedure Laterality Date  . Extremity cyst excision  1994    right leg  . 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 Colostomy closure  04/2007  . Scar revision  01/21/2011    Procedure: SCAR REVISION;  Surgeon: Hermelinda Dellen;  Location: [facility];  Service: Plastics;  Laterality: N/A;  exploration of scar of abdomen and repair of defect  . Hernia repair    . Abdominal surgery    . Appendectomy     Family History  Problem Relation Age of Onset  . Diabetes Mother   . Hypertension Mother   . Diabetes Father   . Hypertension Father    History  Substance Use Topics  . Smoking status: Current Some Day Smoker -- 0.40 packs/day for 10 years    Types:  Cigarettes  . Smokeless tobacco: Never Used     Comment: quit smoking 10 days ago  . Alcohol Use: Yes     Comment: Rare   OB History    No data available     Review of Systems  All other systems reviewed and are negative.     Allergies  Amitriptyline; Baclofen; Cymbalta; Gabapentin; Monosodium glutamate; Vicodin; Alprazolam; Magnesium salicylate; Rizatriptan; Tizanidine; Toradol; Adhesive; and Lamotrigine  Home Medications   Prior to Admission medications   Medication Sig Start Date End Date Taking? Authorizing Provider  Alum & Mag Hydroxide-Simeth (MAGIC MOUTHWASH W/LIDOCAINE) SOLN Take 5 mLs by mouth 3 (three) times daily as needed (For reflux pain). 10/24/13   Tanna Furry, MD  busPIRone (BUSPAR) 5 MG tablet Take 5 mg by mouth 3 (three) times daily.    Historical Provider, MD  carbamazepine (CARBATROL) 200 MG 12 hr capsule Take 200 mg by mouth 3 (three) times daily. 04/22/13 04/22/14  Historical Provider, MD  carisoprodol (SOMA) 350 MG tablet Take 1 tablet (350 mg total) by mouth 3 (three) times daily. 11/01/13   Lenox Ahr, PA-C  celecoxib (CELEBREX) 100 MG capsule Take 1 capsule (100 mg total) by mouth 2 (two) times daily. 11/01/13   Lenox Ahr, PA-C  clonazePAM (KLONOPIN) 1 MG tablet Take 1 mg by mouth 2 (two) times daily  as needed for anxiety.     Historical Provider, MD  cyclobenzaprine (FLEXERIL) 10 MG tablet Take 10 mg by mouth 3 (three) times daily as needed for muscle spasms.    Historical Provider, MD  diphenhydrAMINE (BENADRYL) 25 MG tablet Take 50 mg by mouth daily.     Historical Provider, MD  EPINEPHrine (EPIPEN) 0.3 mg/0.3 mL SOAJ injection Inject 0.3 mg into the muscle as needed (allergic reaction).     Historical Provider, MD  Fingolimod HCl (GILENYA) 0.5 MG CAPS Take 0.5 mg by mouth daily.     Historical Provider, MD  loratadine (CLARITIN) 10 MG tablet Take 10 mg by mouth daily.     Historical Provider, MD  medroxyPROGESTERone (DEPO-PROVERA) 150 MG/ML  injection Inject 150 mg into the muscle every 3 (three) months. Last injection mid September    Historical Provider, MD  omeprazole (PRILOSEC) 40 MG capsule Take 40 mg by mouth daily.     Historical Provider, MD  ondansetron (ZOFRAN) 8 MG tablet Take 8 mg by mouth every 6 (six) hours as needed for nausea or vomiting.     Historical Provider, MD  potassium chloride (K-DUR) 10 MEQ tablet Take 10 mEq by mouth 2 (two) times daily.    Historical Provider, MD  pregabalin (LYRICA) 200 MG capsule Take 600 mg by mouth at bedtime.    Historical Provider, MD  promethazine (PHENERGAN) 25 MG tablet Take 25 mg by mouth every 6 (six) hours as needed for nausea or vomiting.     Historical Provider, MD  propranolol (INDERAL) 10 MG tablet Take 10 mg by mouth 2 (two) times daily.    Historical Provider, MD  ranitidine (ZANTAC) 300 MG capsule Take 300 mg by mouth at bedtime.     Historical Provider, MD  topiramate (TOPAMAX) 100 MG tablet Take 100 mg by mouth 2 (two) times daily.     Historical Provider, MD  venlafaxine (EFFEXOR) 75 MG tablet Take 75 mg by mouth 3 (three) times daily with meals.    Historical Provider, MD   BP 145/98 mmHg  Pulse 100  Temp(Src) 98.6 F (37 C) (Oral)  Resp 18  Ht 5\' 5"  (1.651 m)  Wt 218 lb (98.884 kg)  BMI 36.28 kg/m2  SpO2 100% Physical Exam  Constitutional: She appears well-developed and well-nourished. No distress.  HENT:  Head: Normocephalic and atraumatic.  Neck: Neck supple.  Cardiovascular: Normal rate and regular rhythm.   Pulmonary/Chest: Effort normal and breath sounds normal. No respiratory distress. She has no wheezes. She has no rales.  Abdominal: Soft. She exhibits no distension. There is no tenderness. There is no rebound and no guarding.  Neurological: She is alert.  CN II-XII intact, EOMs intact, no pronator drift, grip strengths equal bilaterally; strength 5/5 in all extremities, sensation intact in all extremities; finger to nose, heel to shin, rapid  alternating movements normal; gait is normal.     Skin: She is not diaphoretic.  Nursing note and vitals reviewed.   ED Course  Procedures (including critical care time) Labs Review Labs Reviewed - No data to display  Imaging Review No results found.   EKG Interpretation None     11:55 AM Pt reports great improvement after migraine cocktail.  She is smiling and states she is ready for discharge.   MDM   Final diagnoses:  Migraine without status migrainosus, not intractable, unspecified migraine type    Afebrile, nontoxic patient with typical migraine  headache.  No red flags including head trauma, fevers,  meningeal signs, focal neurologic deficits.  Migraine cocktail  given with relief.    D/C home with neurology follow up.  Discussed result, findings, treatment, and follow up  with patient.  Pt given return precautions.  Pt verbalizes understanding and agrees with plan.       Clayton Bibles, PA-C 11/14/13 [place], MD 11/19/13 [phone]

## 2013-11-16 ENCOUNTER — Encounter (HOSPITAL_COMMUNITY): Payer: Self-pay | Admitting: *Deleted

## 2013-11-16 ENCOUNTER — Telehealth: Payer: Self-pay | Admitting: Diagnostic Neuroimaging

## 2013-11-16 ENCOUNTER — Emergency Department (HOSPITAL_COMMUNITY)
Admission: EM | Admit: 2013-11-16 | Discharge: 2013-11-16 | Disposition: A | Discharge status: Home / Self Care | Payer: Medicare Other | Attending: Emergency Medicine | Admitting: Emergency Medicine

## 2013-11-16 DIAGNOSIS — Z79899 Other long term (current) drug therapy: Secondary | ICD-10-CM | POA: Diagnosis not present

## 2013-11-16 DIAGNOSIS — F329 Major depressive disorder, single episode, unspecified: Secondary | ICD-10-CM | POA: Diagnosis not present

## 2013-11-16 DIAGNOSIS — J45909 Unspecified asthma, uncomplicated: Secondary | ICD-10-CM | POA: Insufficient documentation

## 2013-11-16 DIAGNOSIS — Z720 Tobacco use: Secondary | ICD-10-CM | POA: Diagnosis not present

## 2013-11-16 DIAGNOSIS — R51 Headache: Secondary | ICD-10-CM

## 2013-11-16 DIAGNOSIS — Z8742 Personal history of other diseases of the female genital tract: Secondary | ICD-10-CM | POA: Insufficient documentation

## 2013-11-16 DIAGNOSIS — Z791 Long term (current) use of non-steroidal anti-inflammatories (NSAID): Secondary | ICD-10-CM | POA: Insufficient documentation

## 2013-11-16 DIAGNOSIS — K219 Gastro-esophageal reflux disease without esophagitis: Secondary | ICD-10-CM | POA: Diagnosis not present

## 2013-11-16 DIAGNOSIS — M2550 Pain in unspecified joint: Secondary | ICD-10-CM | POA: Insufficient documentation

## 2013-11-16 DIAGNOSIS — F419 Anxiety disorder, unspecified: Secondary | ICD-10-CM | POA: Diagnosis not present

## 2013-11-16 DIAGNOSIS — G8929 Other chronic pain: Secondary | ICD-10-CM | POA: Diagnosis not present

## 2013-11-16 DIAGNOSIS — G43909 Migraine, unspecified, not intractable, without status migrainosus: Secondary | ICD-10-CM | POA: Diagnosis not present

## 2013-11-16 DIAGNOSIS — R519 Headache, unspecified: Secondary | ICD-10-CM

## 2013-11-16 MED ORDER — DEXAMETHASONE SODIUM PHOSPHATE 4 MG/ML IJ SOLN
10.0000 mg | Freq: Once | INTRAMUSCULAR | Status: AC
  Administered 2013-11-16: 10 mg via INTRAMUSCULAR
  Filled 2013-11-16: qty 3

## 2013-11-16 MED ORDER — DIPHENHYDRAMINE HCL 50 MG/ML IJ SOLN
25.0000 mg | Freq: Once | INTRAMUSCULAR | Status: AC
  Administered 2013-11-16: 25 mg via INTRAMUSCULAR
  Filled 2013-11-16: qty 1

## 2013-11-16 NOTE — ED Provider Notes (Signed)
CSN: [id number]     Arrival date & time 11/16/13  1526 History   First MD Initiated Contact with Patient 11/16/13 1558     Chief Complaint  Patient presents with  . Headache     (Consider location/radiation/quality/duration/timing/severity/associated sxs/prior Treatment) HPI Comments: Pt comes in today with left sided headache and vomiting that started 2 days ago. She states that she has a history of migraines and this is similar. She states that she is also having pain in her hand and knees related to her ms. She states that he started seeing a neurologist at [facility] but she couldn't get in to see them today. Denies fever, joint swelling or redness.  The history is provided by the patient. No language interpreter was used.    Past Medical History  Diagnosis Date  . Headache(784.0)     migraines  . Multiple sclerosis   . Asthma as a child  . GERD (gastroesophageal reflux disease)   . Urinary urgency   . Anxiety   . Perforated bowel 2009  . Migraines   . Depression   . Chronic back pain   . Chronic pain   . Ovarian cyst    Past Surgical History  Procedure Laterality Date  . Extremity cyst excision  1994    right leg  . 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 Colostomy closure  04/2007  . Scar revision  01/21/2011    Procedure: SCAR REVISION;  Surgeon: Hermelinda Dellen;  Location: [facility];  Service: Plastics;  Laterality: N/A;  exploration of scar of abdomen and repair of defect  . Hernia repair    . Abdominal surgery    . Appendectomy     Family History  Problem Relation Age of Onset  . Diabetes Mother   . Hypertension Mother   . Diabetes Father   . Hypertension Father    History  Substance Use Topics  . Smoking status: Current Some Day Smoker -- 0.40 packs/day for 10 years    Types: Cigarettes  . Smokeless tobacco: Never Used     Comment: quit smoking 10 days ago  . Alcohol Use: Yes     Comment: Rare   OB History    No data  available     Review of Systems  All other systems reviewed and are negative.     Allergies  Amitriptyline; Baclofen; Cymbalta; Gabapentin; Monosodium glutamate; Vicodin; Alprazolam; Magnesium salicylate; Rizatriptan; Tizanidine; Toradol; Adhesive; and Lamotrigine  Home Medications   Prior to Admission medications   Medication Sig Start Date End Date Taking? Authorizing Provider  Alum & Mag Hydroxide-Simeth (MAGIC MOUTHWASH W/LIDOCAINE) SOLN Take 5 mLs by mouth 3 (three) times daily as needed (For reflux pain). 10/24/13   Tanna Furry, MD  busPIRone (BUSPAR) 5 MG tablet Take 5 mg by mouth 3 (three) times daily.    Historical Provider, MD  carbamazepine (CARBATROL) 200 MG 12 hr capsule Take 200 mg by mouth 3 (three) times daily. 04/22/13 04/22/14  Historical Provider, MD  carisoprodol (SOMA) 350 MG tablet Take 1 tablet (350 mg total) by mouth 3 (three) times daily. 11/01/13   Lenox Ahr, PA-C  celecoxib (CELEBREX) 100 MG capsule Take 1 capsule (100 mg total) by mouth 2 (two) times daily. 11/01/13   Lenox Ahr, PA-C  clonazePAM (KLONOPIN) 1 MG tablet Take 1 mg by mouth 2 (two) times daily as needed for anxiety.     Historical Provider, MD  cyclobenzaprine (  FLEXERIL) 10 MG tablet Take 10 mg by mouth 3 (three) times daily as needed for muscle spasms.    Historical Provider, MD  diphenhydrAMINE (BENADRYL) 25 MG tablet Take 50 mg by mouth every 8 (eight) hours as needed for sleep.     Historical Provider, MD  EPINEPHrine (EPIPEN) 0.3 mg/0.3 mL SOAJ injection Inject 0.3 mg into the muscle as needed (allergic reaction).     Historical Provider, MD  Fingolimod HCl (GILENYA) 0.5 MG CAPS Take 0.5 mg by mouth daily.     Historical Provider, MD  loratadine (CLARITIN) 10 MG tablet Take 10 mg by mouth daily.     Historical Provider, MD  medroxyPROGESTERone (DEPO-PROVERA) 150 MG/ML injection Inject 150 mg into the muscle every 3 (three) months. Last injection mid September    Historical Provider, MD   omeprazole (PRILOSEC) 40 MG capsule Take 40 mg by mouth daily.     Historical Provider, MD  ondansetron (ZOFRAN) 8 MG tablet Take 8 mg by mouth every 6 (six) hours as needed for nausea or vomiting.     Historical Provider, MD  potassium chloride (K-DUR) 10 MEQ tablet Take 10 mEq by mouth 2 (two) times daily.    Historical Provider, MD  pregabalin (LYRICA) 200 MG capsule Take 600 mg by mouth at bedtime.    Historical Provider, MD  promethazine (PHENERGAN) 25 MG tablet Take 25 mg by mouth every 6 (six) hours as needed for nausea or vomiting.     Historical Provider, MD  propranolol (INDERAL) 10 MG tablet Take 10 mg by mouth 2 (two) times daily.    Historical Provider, MD  ranitidine (ZANTAC) 300 MG capsule Take 300 mg by mouth at bedtime.     Historical Provider, MD  topiramate (TOPAMAX) 100 MG tablet Take 100 mg by mouth 2 (two) times daily.     Historical Provider, MD  venlafaxine (EFFEXOR) 75 MG tablet Take 75 mg by mouth 3 (three) times daily with meals.    Historical Provider, MD   BP 137/98 mmHg  Pulse 99  Temp(Src) 98.2 F (36.8 C) (Oral)  Resp 18  Ht 5\' 5"  (1.651 m)  Wt 218 lb (98.884 kg)  BMI 36.28 kg/m2  SpO2 100% Physical Exam  Constitutional: She is oriented to person, place, and time. She appears well-developed and well-nourished.  HENT:  Right Ear: External ear normal.  Left Ear: External ear normal.  Mouth/Throat: Oropharynx is clear and moist.  Eyes: Conjunctivae and EOM are normal. Pupils are equal, round, and reactive to light.  Neck: Normal range of motion. Neck supple.  Cardiovascular: Normal rate and regular rhythm.   Pulmonary/Chest: Effort normal and breath sounds normal.  Abdominal: Soft. Bowel sounds are normal. There is no tenderness.  Musculoskeletal: Normal range of motion.  Neurological: She is alert and oriented to person, place, and time. Coordination normal.  Skin: Skin is warm and dry.  Nursing note and vitals reviewed.   ED Course  Procedures  (including critical care time) Labs Review Labs Reviewed - No data to display  Imaging Review No results found.   EKG Interpretation None      MDM   Final diagnoses:  Headache, unspecified headache type  Joint pain    Pt is feeling better after dexamethasone and benadryl. Pt is ready to go home. Pt has been here 36 times this year. Think she can follow up with neurology for her ms flare    Glendell Docker, NP 11/16/13 [place], MD 11/19/13  1542 

## 2013-11-16 NOTE — ED Notes (Signed)
Headache for 2 days, pain both hands and rt kneee.  Nausea, no vomiting today , but had vomited over the weekend.

## 2013-11-16 NOTE — Telephone Encounter (Signed)
Chart reviewed, patient was seen for MS, now complains of worsening headaches, multiple ED visit, agree come in earlier followup

## 2013-11-16 NOTE — Telephone Encounter (Signed)
Patient stated she has had rebound Migraines, started Saturday and went to ER on Sunday and Monday.  Experiencing a lot of Vertigo.  Please call and advise.

## 2013-11-16 NOTE — Telephone Encounter (Signed)
Spoke to patient. Says Sx(s) have increased.  Scheduled appt w/ Dr. Leta Baptist next week. 11/23/13

## 2013-11-16 NOTE — Discharge Instructions (Signed)
Arthralgia Arthralgia is joint pain. A joint is a place where two bones meet. Joint pain can happen for many reasons. The joint can be bruised, stiff, infected, or weak from aging. Pain usually goes away after resting and taking medicine for soreness.  HOME CARE  Rest the joint as told by your doctor.  Keep the sore joint raised (elevated) for the first 24 hours.  Put ice on the joint area.  Put ice in a plastic bag.  Place a towel between your skin and the bag.  Leave the ice on for 15-20 minutes, 03-04 times a day.  Wear your splint, casting, elastic bandage, or sling as told by your doctor.  Only take medicine as told by your doctor. Do not take aspirin.  Use crutches as told by your doctor. Do not put weight on the joint until told to by your doctor. GET HELP RIGHT AWAY IF:   You have bruising, puffiness (swelling), or more pain.  Your fingers or toes turn blue or start to lose feeling (numb).  Your medicine does not lessen the pain.  Your pain becomes severe.  You have a temperature by mouth above 102 F (38.9 C), not controlled by medicine.  You cannot move or use the joint. MAKE SURE YOU:   Understand these instructions.  Will watch your condition.  Will get help right away if you are not doing well or get worse. Document Released: 12/12/2008 Document Revised: 03/18/2011 Document Reviewed: 12/12/2008 [facility] Patient Information 2015 [place], [place]. This information is not intended to replace advice given to you by your health care provider. Make sure you discuss any questions you have with your health care provider.  Migraine Headache A migraine headache is very bad, throbbing pain on one or both sides of your head. Talk to your doctor about what things may bring on (trigger) your migraine headaches. HOME CARE  Only take medicines as told by your doctor.  Lie down in a dark, quiet room when you have a migraine.  Keep a journal to find out if certain  things bring on migraine headaches. For example, write down:  What you eat and drink.  How much sleep you get.  Any change to your diet or medicines.  Lessen how much alcohol you drink.  Quit smoking if you smoke.  Get enough sleep.  Lessen any stress in your life.  Keep lights dim if bright lights bother you or make your migraines worse. GET HELP RIGHT AWAY IF:   Your migraine becomes really bad.  You have a fever.  You have a stiff neck.  You have trouble seeing.  Your muscles are weak, or you lose muscle control.  You lose your balance or have trouble walking.  You feel like you will pass out (faint), or you pass out.  You have really bad symptoms that are different than your first symptoms. MAKE SURE YOU:   Understand these instructions.  Will watch your condition.  Will get help right away if you are not doing well or get worse. Document Released: 10/03/2007 Document Revised: 03/18/2011 Document Reviewed: 08/31/2012 [facility] Patient Information 2015 [place], [place]. This information is not intended to replace advice given to you by your health care provider. Make sure you discuss any questions you have with your health care provider.

## 2013-11-18 ENCOUNTER — Encounter (HOSPITAL_COMMUNITY): Payer: Self-pay | Admitting: *Deleted

## 2013-11-18 ENCOUNTER — Emergency Department (HOSPITAL_COMMUNITY)
Admission: EM | Admit: 2013-11-18 | Discharge: 2013-11-18 | Discharge status: Against Medical Advice | Payer: Medicare Other | Source: Home / Self Care

## 2013-11-18 ENCOUNTER — Telehealth: Payer: Self-pay | Admitting: Diagnostic Neuroimaging

## 2013-11-18 ENCOUNTER — Emergency Department (HOSPITAL_COMMUNITY)
Admission: EM | Admit: 2013-11-18 | Discharge: 2013-11-18 | Disposition: A | Discharge status: Home / Self Care | Payer: Medicare Other | Attending: Emergency Medicine | Admitting: Emergency Medicine

## 2013-11-18 DIAGNOSIS — F419 Anxiety disorder, unspecified: Secondary | ICD-10-CM | POA: Diagnosis not present

## 2013-11-18 DIAGNOSIS — G43909 Migraine, unspecified, not intractable, without status migrainosus: Secondary | ICD-10-CM | POA: Diagnosis not present

## 2013-11-18 DIAGNOSIS — Z8742 Personal history of other diseases of the female genital tract: Secondary | ICD-10-CM | POA: Insufficient documentation

## 2013-11-18 DIAGNOSIS — G35 Multiple sclerosis: Secondary | ICD-10-CM | POA: Insufficient documentation

## 2013-11-18 DIAGNOSIS — Z720 Tobacco use: Secondary | ICD-10-CM | POA: Insufficient documentation

## 2013-11-18 DIAGNOSIS — Z79899 Other long term (current) drug therapy: Secondary | ICD-10-CM | POA: Diagnosis not present

## 2013-11-18 DIAGNOSIS — R51 Headache: Secondary | ICD-10-CM

## 2013-11-18 DIAGNOSIS — R110 Nausea: Secondary | ICD-10-CM | POA: Diagnosis not present

## 2013-11-18 DIAGNOSIS — G8929 Other chronic pain: Secondary | ICD-10-CM | POA: Insufficient documentation

## 2013-11-18 DIAGNOSIS — F329 Major depressive disorder, single episode, unspecified: Secondary | ICD-10-CM | POA: Diagnosis not present

## 2013-11-18 DIAGNOSIS — R519 Headache, unspecified: Secondary | ICD-10-CM

## 2013-11-18 DIAGNOSIS — K219 Gastro-esophageal reflux disease without esophagitis: Secondary | ICD-10-CM | POA: Diagnosis not present

## 2013-11-18 DIAGNOSIS — J45909 Unspecified asthma, uncomplicated: Secondary | ICD-10-CM | POA: Insufficient documentation

## 2013-11-18 MED ORDER — DIPHENHYDRAMINE HCL 50 MG/ML IJ SOLN
50.0000 mg | Freq: Once | INTRAMUSCULAR | Status: AC
  Administered 2013-11-18: 50 mg via INTRAVENOUS
  Filled 2013-11-18: qty 1

## 2013-11-18 MED ORDER — HYDROMORPHONE HCL 1 MG/ML IJ SOLN
1.0000 mg | Freq: Once | INTRAMUSCULAR | Status: AC
  Administered 2013-11-18: 1 mg via INTRAVENOUS
  Filled 2013-11-18: qty 1

## 2013-11-18 MED ORDER — DEXAMETHASONE SODIUM PHOSPHATE 10 MG/ML IJ SOLN
10.0000 mg | Freq: Once | INTRAMUSCULAR | Status: AC
  Administered 2013-11-18: 10 mg via INTRAVENOUS
  Filled 2013-11-18: qty 1

## 2013-11-18 MED ORDER — METOCLOPRAMIDE HCL 5 MG/ML IJ SOLN
10.0000 mg | Freq: Once | INTRAMUSCULAR | Status: AC
  Administered 2013-11-18: 10 mg via INTRAVENOUS
  Filled 2013-11-18: qty 2

## 2013-11-18 NOTE — Telephone Encounter (Signed)
Not sure what to tell her. Go to ED if HA is unrelenting, if somewhat better, see if she can come in for appt with Dr. Mamie Nick. Early next week.

## 2013-11-18 NOTE — ED Notes (Addendum)
Headache since Saturday, Seen here 11/10 for same.  Told to come to ER by neurologist.

## 2013-11-18 NOTE — ED Provider Notes (Signed)
CSN: [id number]     Arrival date & time 11/18/13  2005 History  This chart was scribed for Maudry Diego, MD by Rayfield Citizen, ED Scribe. This patient was seen in room APA19/APA19 and the patient's care was started at 8:43 PM.    Chief Complaint  Patient presents with  . Headache   Patient is a 33 y.o. female presenting with migraines. The history is provided by the patient (pt complains of migraine). No language interpreter was used.  Migraine This is a chronic problem. The current episode started more than 2 days ago. The problem occurs every several days. The problem has been gradually worsening. Associated symptoms include headaches. Pertinent negatives include no chest pain and no abdominal pain. Nothing aggravates the symptoms. Nothing relieves the symptoms. She has tried nothing for the symptoms.     HPI Comments: Emily [name] is a 33 y.o. female with past medical history of MS and migraines who presents to the Emergency Department complaining of 6 days of a headache. Patient was seen here on 11/10 for same symptoms. At that point, she was told to come to the ED by her neurologist - today she returned because she "couldn't take the pain anymore." She reports associated nausea and dizziness. This is a recurrent problem for her; she reports difficulty finding the correct medication cocktail to relieve her symptoms.    Past Medical History  Diagnosis Date  . Headache(784.0)     migraines  . Multiple sclerosis   . Asthma as a child  . GERD (gastroesophageal reflux disease)   . Urinary urgency   . Anxiety   . Perforated bowel 2009  . Migraines   . Depression   . Chronic back pain   . Chronic pain   . Ovarian cyst    Past Surgical History  Procedure Laterality Date  . Extremity cyst excision  1994    right leg  . 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 Colostomy closure  04/2007  . Scar revision  01/21/2011    Procedure: SCAR REVISION;  Surgeon: Hermelinda Dellen;   Location: [facility];  Service: Plastics;  Laterality: N/A;  exploration of scar of abdomen and repair of defect  . Hernia repair    . Abdominal surgery    . Appendectomy     Family History  Problem Relation Age of Onset  . Diabetes Mother   . Hypertension Mother   . Diabetes Father   . Hypertension Father    History  Substance Use Topics  . Smoking status: Current Some Day Smoker -- 0.40 packs/day for 10 years    Types: Cigarettes  . Smokeless tobacco: Never Used     Comment: quit smoking 10 days ago  . Alcohol Use: Yes     Comment: Rare   OB History    No data available     Review of Systems  Constitutional: Negative for appetite change and fatigue.  HENT: Negative for congestion, ear discharge and sinus pressure.   Eyes: Negative for discharge.  Respiratory: Negative for cough.   Cardiovascular: Negative for chest pain.  Gastrointestinal: Positive for nausea. Negative for abdominal pain and diarrhea.  Genitourinary: Negative for frequency and hematuria.  Musculoskeletal: Negative for back pain.  Skin: Negative for rash.  Neurological: Positive for dizziness and headaches. Negative for seizures.  Psychiatric/Behavioral: Negative for hallucinations and decreased concentration.      Allergies  Amitriptyline; Baclofen; Cymbalta; Gabapentin; Monosodium glutamate; Vicodin;  Alprazolam; Magnesium salicylate; Rizatriptan; Tizanidine; Toradol; Adhesive; and Lamotrigine  Home Medications   Prior to Admission medications   Medication Sig Start Date End Date Taking? Authorizing Provider  Alum & Mag Hydroxide-Simeth (MAGIC MOUTHWASH W/LIDOCAINE) SOLN Take 5 mLs by mouth 3 (three) times daily as needed (For reflux pain). 10/24/13   Tanna Furry, MD  busPIRone (BUSPAR) 5 MG tablet Take 5 mg by mouth 3 (three) times daily.    Historical Provider, MD  carbamazepine (CARBATROL) 200 MG 12 hr capsule Take 200 mg by mouth 3 (three) times daily. 04/22/13 04/22/14  Historical  Provider, MD  carisoprodol (SOMA) 350 MG tablet Take 1 tablet (350 mg total) by mouth 3 (three) times daily. 11/01/13   Lenox Ahr, PA-C  celecoxib (CELEBREX) 100 MG capsule Take 1 capsule (100 mg total) by mouth 2 (two) times daily. 11/01/13   Lenox Ahr, PA-C  clonazePAM (KLONOPIN) 1 MG tablet Take 1 mg by mouth 2 (two) times daily as needed for anxiety.     Historical Provider, MD  cyclobenzaprine (FLEXERIL) 10 MG tablet Take 10 mg by mouth 3 (three) times daily as needed for muscle spasms.    Historical Provider, MD  diphenhydrAMINE (BENADRYL) 25 MG tablet Take 50 mg by mouth every 8 (eight) hours as needed for sleep.     Historical Provider, MD  EPINEPHrine (EPIPEN) 0.3 mg/0.3 mL SOAJ injection Inject 0.3 mg into the muscle as needed (allergic reaction).     Historical Provider, MD  Fingolimod HCl (GILENYA) 0.5 MG CAPS Take 0.5 mg by mouth daily.     Historical Provider, MD  loratadine (CLARITIN) 10 MG tablet Take 10 mg by mouth daily.     Historical Provider, MD  medroxyPROGESTERone (DEPO-PROVERA) 150 MG/ML injection Inject 150 mg into the muscle every 3 (three) months. Last injection mid September    Historical Provider, MD  omeprazole (PRILOSEC) 40 MG capsule Take 40 mg by mouth daily.     Historical Provider, MD  ondansetron (ZOFRAN) 8 MG tablet Take 8 mg by mouth every 6 (six) hours as needed for nausea or vomiting.     Historical Provider, MD  potassium chloride (K-DUR) 10 MEQ tablet Take 10 mEq by mouth 2 (two) times daily.    Historical Provider, MD  pregabalin (LYRICA) 200 MG capsule Take 600 mg by mouth at bedtime.    Historical Provider, MD  promethazine (PHENERGAN) 25 MG tablet Take 25 mg by mouth every 6 (six) hours as needed for nausea or vomiting.     Historical Provider, MD  propranolol (INDERAL) 10 MG tablet Take 10 mg by mouth 2 (two) times daily.    Historical Provider, MD  ranitidine (ZANTAC) 300 MG capsule Take 300 mg by mouth at bedtime.     Historical Provider,  MD  topiramate (TOPAMAX) 100 MG tablet Take 100 mg by mouth 2 (two) times daily.     Historical Provider, MD  venlafaxine (EFFEXOR) 75 MG tablet Take 75 mg by mouth 3 (three) times daily with meals.    Historical Provider, MD   BP 147/102 mmHg  Pulse 93  Temp(Src) 98.1 F (36.7 C) (Oral)  Resp 19  Ht 5\' 5"  (1.651 m)  Wt 218 lb (98.884 kg)  BMI 36.28 kg/m2  SpO2 100% Physical Exam  Constitutional: She is oriented to person, place, and time. She appears well-developed.  HENT:  Head: Normocephalic.  Eyes: Conjunctivae and EOM are normal. No scleral icterus.  Neck: Neck supple. No thyromegaly present.  Cardiovascular:  Normal rate and regular rhythm.  Exam reveals no gallop and no friction rub.   No murmur heard. Pulmonary/Chest: No stridor. She has no wheezes. She has no rales. She exhibits no tenderness.  Abdominal: She exhibits no distension. There is no tenderness. There is no rebound.  Musculoskeletal: Normal range of motion. She exhibits no edema.  Lymphadenopathy:    She has no cervical adenopathy.  Neurological: She is oriented to person, place, and time. She exhibits normal muscle tone. Coordination normal.  Skin: No rash noted. No erythema.  Psychiatric: She has a normal mood and affect. Her behavior is normal.    ED Course  Procedures   DIAGNOSTIC STUDIES: Oxygen Saturation is 100% on RA, normal by my interpretation.    COORDINATION OF CARE: 8:45 PM Discussed treatment plan with pt at bedside and pt agreed to plan.   Labs Review Labs Reviewed - No data to display  Imaging Review No results found.   EKG Interpretation None      MDM   Final diagnoses:  None    .ju The chart was scribed for me under my direct supervision.  I personally performed the history, physical, and medical decision making and all procedures in the evaluation of this patient.Maudry Diego, MD 11/20/13 [phone]

## 2013-11-18 NOTE — Telephone Encounter (Signed)
WID--Patient calling again, aware of appointment on 11/17 with Dr. Leta Baptist.  Stated she's still experiencing Migraines.  Questioning should she go to ER.  Please call and advise.

## 2013-11-18 NOTE — Telephone Encounter (Signed)
Called patient and informed per Dr Guadelupe Sabin note(11/16/13) to go to ED if headaches unrelenting. She verbalized understanding and said ok. Will call us back to update.

## 2013-11-18 NOTE — ED Notes (Signed)
Headache, nausea, dizzy. Here earlier and left . Returned because "I couldn't take it anymore."

## 2013-11-18 NOTE — Discharge Instructions (Signed)
Follow up as needed

## 2013-11-20 ENCOUNTER — Encounter (HOSPITAL_COMMUNITY): Payer: Self-pay | Admitting: Cardiology

## 2013-11-20 ENCOUNTER — Emergency Department (HOSPITAL_COMMUNITY)
Admission: EM | Admit: 2013-11-20 | Discharge: 2013-11-20 | Disposition: A | Discharge status: Home / Self Care | Payer: Medicare Other | Attending: Emergency Medicine | Admitting: Emergency Medicine

## 2013-11-20 DIAGNOSIS — K219 Gastro-esophageal reflux disease without esophagitis: Secondary | ICD-10-CM | POA: Diagnosis not present

## 2013-11-20 DIAGNOSIS — G8929 Other chronic pain: Secondary | ICD-10-CM | POA: Insufficient documentation

## 2013-11-20 DIAGNOSIS — Z791 Long term (current) use of non-steroidal anti-inflammatories (NSAID): Secondary | ICD-10-CM | POA: Insufficient documentation

## 2013-11-20 DIAGNOSIS — Z8742 Personal history of other diseases of the female genital tract: Secondary | ICD-10-CM | POA: Insufficient documentation

## 2013-11-20 DIAGNOSIS — G43909 Migraine, unspecified, not intractable, without status migrainosus: Secondary | ICD-10-CM | POA: Diagnosis present

## 2013-11-20 DIAGNOSIS — F329 Major depressive disorder, single episode, unspecified: Secondary | ICD-10-CM | POA: Insufficient documentation

## 2013-11-20 DIAGNOSIS — F419 Anxiety disorder, unspecified: Secondary | ICD-10-CM | POA: Diagnosis not present

## 2013-11-20 DIAGNOSIS — Z8739 Personal history of other diseases of the musculoskeletal system and connective tissue: Secondary | ICD-10-CM | POA: Diagnosis not present

## 2013-11-20 DIAGNOSIS — Z79899 Other long term (current) drug therapy: Secondary | ICD-10-CM | POA: Diagnosis not present

## 2013-11-20 DIAGNOSIS — G35 Multiple sclerosis: Secondary | ICD-10-CM | POA: Diagnosis not present

## 2013-11-20 DIAGNOSIS — J45909 Unspecified asthma, uncomplicated: Secondary | ICD-10-CM | POA: Diagnosis not present

## 2013-11-20 DIAGNOSIS — Z720 Tobacco use: Secondary | ICD-10-CM | POA: Diagnosis not present

## 2013-11-20 MED ORDER — PROCHLORPERAZINE EDISYLATE 5 MG/ML IJ SOLN
10.0000 mg | Freq: Once | INTRAMUSCULAR | Status: AC
  Administered 2013-11-20: 10 mg via INTRAVENOUS
  Filled 2013-11-20: qty 2

## 2013-11-20 MED ORDER — SODIUM CHLORIDE 0.9 % IV BOLUS (SEPSIS)
1000.0000 mL | INTRAVENOUS | Status: AC
  Administered 2013-11-20: 1000 mL via INTRAVENOUS

## 2013-11-20 MED ORDER — DIPHENHYDRAMINE HCL 50 MG/ML IJ SOLN
25.0000 mg | Freq: Once | INTRAMUSCULAR | Status: AC
  Administered 2013-11-20: 25 mg via INTRAVENOUS
  Filled 2013-11-20: qty 1

## 2013-11-20 NOTE — ED Notes (Signed)
Pt reports she was seen last week at her neurologist for migraine headache. States the pain has continued and was told to come here for worse pain.

## 2013-11-20 NOTE — Discharge Instructions (Signed)

## 2013-11-20 NOTE — ED Provider Notes (Signed)
CSN: [id number]     Arrival date & time 11/20/13  1005 History   First MD Initiated Contact with Patient 11/20/13 1014     Chief Complaint  Patient presents with  . Migraine   Emily [name] is a 33 y.o. female with a history of migraines and multiple sclerosis who presents to the ED today with her fianc complaining of rebound migraine that has gotten progressively worse since this morning. She states this migraine is exactly the same as her previous migraines. She rates her pain at 10 out of 10 and describes it as throbbing.she also reports associated nausea and photophobia. Her pain is generalized to her head but worse on the right side which is typical for her. She denies fevers, chills, vomiting, stiffness, numbness, tingling, weakness, blurry vision, changes to her gait. She denies cough, dysuria, sinus congestion. Patient states she sees a neurologist at [facility] neurology, and reports she has an appointment for follow-up in 3 days.  (Consider location/radiation/quality/duration/timing/severity/associated sxs/prior Treatment) HPI  Past Medical History  Diagnosis Date  . Headache(784.0)     migraines  . Multiple sclerosis   . Asthma as a child  . GERD (gastroesophageal reflux disease)   . Urinary urgency   . Anxiety   . Perforated bowel 2009  . Migraines   . Depression   . Chronic back pain   . Chronic pain   . Ovarian cyst    Past Surgical History  Procedure Laterality Date  . Extremity cyst excision  1994    right leg  . 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 Colostomy closure  04/2007  . Scar revision  01/21/2011    Procedure: SCAR REVISION;  Surgeon: Hermelinda Dellen;  Location: [facility];  Service: Plastics;  Laterality: N/A;  exploration of scar of abdomen and repair of defect  . Hernia repair    . Abdominal surgery    . Appendectomy     Family History  Problem Relation Age of Onset  . Diabetes Mother   . Hypertension Mother   .  Diabetes Father   . Hypertension Father    History  Substance Use Topics  . Smoking status: Current Some Day Smoker -- 0.40 packs/day for 10 years    Types: Cigarettes  . Smokeless tobacco: Never Used     Comment: quit smoking 10 days ago  . Alcohol Use: Yes     Comment: Rare   OB History    No data available     Review of Systems  Constitutional: Negative for fever and chills.  HENT: Negative for congestion, ear pain, facial swelling, postnasal drip, rhinorrhea, sinus pressure, sore throat and trouble swallowing.   Eyes: Positive for photophobia. Negative for pain.  Respiratory: Negative for cough, shortness of breath and wheezing.   Cardiovascular: Negative for chest pain, palpitations and leg swelling.  Gastrointestinal: Negative for nausea, vomiting, abdominal pain and diarrhea.  Genitourinary: Negative for dysuria, urgency and frequency.  Musculoskeletal: Negative for myalgias, back pain, gait problem, neck pain and neck stiffness.  Skin: Negative for pallor and rash.  Neurological: Positive for headaches. Negative for dizziness, tremors, seizures, syncope, speech difficulty, weakness, light-headedness and numbness.      Allergies  Amitriptyline; Baclofen; Cymbalta; Gabapentin; Monosodium glutamate; Vicodin; Alprazolam; Magnesium salicylate; Rizatriptan; Tizanidine; Toradol; Adhesive; and Lamotrigine  Home Medications   Prior to Admission medications   Medication Sig Start Date End Date Taking? Authorizing Provider  Alum & Mag Hydroxide-Simeth (MAGIC  MOUTHWASH W/LIDOCAINE) SOLN Take 5 mLs by mouth 3 (three) times daily as needed (For reflux pain). 10/24/13   Tanna Furry, MD  busPIRone (BUSPAR) 5 MG tablet Take 5 mg by mouth 3 (three) times daily.    Historical Provider, MD  carbamazepine (CARBATROL) 200 MG 12 hr capsule Take 200 mg by mouth 3 (three) times daily. 04/22/13 04/22/14  Historical Provider, MD  carisoprodol (SOMA) 350 MG tablet Take 1 tablet (350 mg total) by  mouth 3 (three) times daily. 11/01/13   Lenox Ahr, PA-C  celecoxib (CELEBREX) 100 MG capsule Take 1 capsule (100 mg total) by mouth 2 (two) times daily. 11/01/13   Lenox Ahr, PA-C  clonazePAM (KLONOPIN) 1 MG tablet Take 1 mg by mouth 2 (two) times daily as needed for anxiety.     Historical Provider, MD  cyclobenzaprine (FLEXERIL) 10 MG tablet Take 10 mg by mouth 3 (three) times daily as needed for muscle spasms.    Historical Provider, MD  diphenhydrAMINE (BENADRYL) 25 MG tablet Take 50 mg by mouth every 8 (eight) hours as needed for sleep.     Historical Provider, MD  EPINEPHrine (EPIPEN) 0.3 mg/0.3 mL SOAJ injection Inject 0.3 mg into the muscle as needed (allergic reaction).     Historical Provider, MD  Fingolimod HCl (GILENYA) 0.5 MG CAPS Take 0.5 mg by mouth daily.     Historical Provider, MD  loratadine (CLARITIN) 10 MG tablet Take 10 mg by mouth daily.     Historical Provider, MD  medroxyPROGESTERone (DEPO-PROVERA) 150 MG/ML injection Inject 150 mg into the muscle every 3 (three) months. Last injection mid September    Historical Provider, MD  morphine (MSIR) 15 MG tablet Take 15 mg by mouth 4 (four) times daily as needed. pain 11/03/13   Historical Provider, MD  omeprazole (PRILOSEC) 40 MG capsule Take 40 mg by mouth daily.     Historical Provider, MD  ondansetron (ZOFRAN) 8 MG tablet Take 8 mg by mouth every 6 (six) hours as needed for nausea or vomiting.     Historical Provider, MD  potassium chloride (K-DUR) 10 MEQ tablet Take 10 mEq by mouth 2 (two) times daily.    Historical Provider, MD  pregabalin (LYRICA) 200 MG capsule Take 600 mg by mouth at bedtime.    Historical Provider, MD  promethazine (PHENERGAN) 25 MG tablet Take 25 mg by mouth every 6 (six) hours as needed for nausea or vomiting.     Historical Provider, MD  propranolol (INDERAL) 10 MG tablet Take 10 mg by mouth 2 (two) times daily.    Historical Provider, MD  ranitidine (ZANTAC) 300 MG capsule Take 300 mg by  mouth at bedtime.     Historical Provider, MD  topiramate (TOPAMAX) 100 MG tablet Take 100 mg by mouth 2 (two) times daily.     Historical Provider, MD  venlafaxine (EFFEXOR) 75 MG tablet Take 75 mg by mouth 3 (three) times daily with meals.    Historical Provider, MD   BP 142/99 mmHg  Pulse 99  Temp(Src) 97.4 F (36.3 C) (Oral)  Resp 16  Ht 5\' 5"  (1.651 m)  Wt 218 lb (98.884 kg)  BMI 36.28 kg/m2  SpO2 100% Physical Exam  Constitutional: She is oriented to person, place, and time. She appears well-developed and well-nourished. No distress.  HENT:  Head: Normocephalic and atraumatic.  Right Ear: External ear normal.  Left Ear: External ear normal.  Nose: Nose normal.  Mouth/Throat: Oropharynx is clear and moist. No  oropharyngeal exudate.  Eyes: Conjunctivae and EOM are normal. Pupils are equal, round, and reactive to light. Right eye exhibits no discharge. Left eye exhibits no discharge.  Neck: Normal range of motion. Neck supple.  Cardiovascular: Normal rate, regular rhythm, normal heart sounds and intact distal pulses.  Exam reveals no gallop and no friction rub.   No murmur heard. Pulmonary/Chest: Effort normal and breath sounds normal. No respiratory distress. She has no wheezes. She has no rales.  Abdominal: Soft. There is no tenderness.  Musculoskeletal: Normal range of motion. She exhibits no edema.  Lymphadenopathy:    She has no cervical adenopathy.  Neurological: She is alert and oriented to person, place, and time. No cranial nerve deficit. Coordination normal.  Cranial nerves II through XII intact bilaterally. No pronator drift. Finger to nose intact bilaterally. Rapid alternating movements intact bilaterally. Strength 5 out of 5 in her bilateral upper and lower extremities. Good grip strength bilaterally. Normal gait. Patient able to ambulate without difficulty or assistance. Speech is normal.  Skin: Skin is warm and dry. No rash noted. She is not diaphoretic. No erythema.  No pallor.  Psychiatric: She has a normal mood and affect. Her behavior is normal.  Nursing note and vitals reviewed.   ED Course  Procedures (including critical care time) Labs Review Labs Reviewed - No data to display  Imaging Review No results found.   EKG Interpretation None      Filed Vitals:   11/20/13 1006 11/20/13 1107  BP: 152/107 142/99  Pulse: 102 99  Temp: 97.4 F (36.3 C)   TempSrc: Oral   Resp: 16 16  Height: 5\' 5"  (1.651 m)   Weight: 218 lb (98.884 kg)   SpO2: 99% 100%     MDM   Meds given in ED:  Medications  prochlorperazine (COMPAZINE) injection 10 mg (10 mg Intravenous Given 11/20/13 1118)  diphenhydrAMINE (BENADRYL) injection 25 mg (25 mg Intravenous Given 11/20/13 1117)  sodium chloride 0.9 % bolus 1,000 mL (1,000 mLs Intravenous New Bag/Given 11/20/13 1117)    New Prescriptions   No medications on file    Final diagnoses:  Migraine without status migrainosus, not intractable, unspecified migraine type   Emily [name] is a 33 y.o. female  with a history of migraines and multiple sclerosis who presents to the ED today with her fianc complaining of rebound migraine that has gotten progressively worse since this morning. She states this migraine is exactly the same as her previous migraines. Patient is neurologically intact. She denies numbness, tingling, weakness. Patient has a follow-up appointment in 3 days. Neurologist.  11:54 AM patient reports that her migraine has improved with Compazine, Benadryl and fluids and her pain is now 2 out of 10. She denies any nausea. Patient states that she is ready to be discharged. She has had medications at home and does not need any refills at this time. Advised patient to keep her follow-up with her neurologist in 3 days. Advised patient to return to the ED with new or  worsening symptoms or new concerns.patient verbalized understanding and agreement with plan.  Patient was discussed with Dr. Noemi Chapel who agrees with assessment and plan.    Hanley Hays, PA-C 11/20/13 1158  Johnna Acosta, MD 11/21/13 [phone]

## 2013-11-21 ENCOUNTER — Emergency Department (HOSPITAL_COMMUNITY)
Admission: EM | Admit: 2013-11-21 | Discharge: 2013-11-21 | Disposition: A | Discharge status: Home / Self Care | Payer: Medicare Other | Attending: Emergency Medicine | Admitting: Emergency Medicine

## 2013-11-21 ENCOUNTER — Encounter (HOSPITAL_COMMUNITY): Payer: Self-pay | Admitting: Cardiology

## 2013-11-21 DIAGNOSIS — G43909 Migraine, unspecified, not intractable, without status migrainosus: Secondary | ICD-10-CM | POA: Diagnosis present

## 2013-11-21 DIAGNOSIS — J45909 Unspecified asthma, uncomplicated: Secondary | ICD-10-CM | POA: Diagnosis not present

## 2013-11-21 DIAGNOSIS — F329 Major depressive disorder, single episode, unspecified: Secondary | ICD-10-CM | POA: Diagnosis not present

## 2013-11-21 DIAGNOSIS — Z79899 Other long term (current) drug therapy: Secondary | ICD-10-CM | POA: Diagnosis not present

## 2013-11-21 DIAGNOSIS — F419 Anxiety disorder, unspecified: Secondary | ICD-10-CM | POA: Diagnosis not present

## 2013-11-21 DIAGNOSIS — Z87448 Personal history of other diseases of urinary system: Secondary | ICD-10-CM | POA: Diagnosis not present

## 2013-11-21 DIAGNOSIS — G8929 Other chronic pain: Secondary | ICD-10-CM | POA: Diagnosis not present

## 2013-11-21 DIAGNOSIS — K219 Gastro-esophageal reflux disease without esophagitis: Secondary | ICD-10-CM | POA: Diagnosis not present

## 2013-11-21 DIAGNOSIS — Z720 Tobacco use: Secondary | ICD-10-CM | POA: Diagnosis not present

## 2013-11-21 MED ORDER — METOCLOPRAMIDE HCL 5 MG/ML IJ SOLN: 10.0000 mg | Freq: Once | INTRAMUSCULAR | Status: AC

## 2013-11-21 MED ORDER — HYDROMORPHONE HCL 1 MG/ML IJ SOLN: 1.0000 mg | Freq: Once | INTRAMUSCULAR | Status: DC

## 2013-11-21 MED ORDER — DIPHENHYDRAMINE HCL 50 MG/ML IJ SOLN: 50.0000 mg | Freq: Once | INTRAMUSCULAR | Status: DC

## 2013-11-21 MED ORDER — DIPHENHYDRAMINE HCL 50 MG/ML IJ SOLN
50.0000 mg | Freq: Once | INTRAMUSCULAR | Status: AC
  Administered 2013-11-21: 50 mg via INTRAVENOUS
  Filled 2013-11-21: qty 1

## 2013-11-21 MED ORDER — HYDROMORPHONE HCL 1 MG/ML IJ SOLN
1.0000 mg | Freq: Once | INTRAMUSCULAR | Status: AC
  Administered 2013-11-21: 1 mg via INTRAVENOUS
  Filled 2013-11-21: qty 1

## 2013-11-21 MED ORDER — METOCLOPRAMIDE HCL 5 MG/ML IJ SOLN
10.0000 mg | Freq: Once | INTRAMUSCULAR | Status: AC
  Administered 2013-11-21: 10 mg via INTRAVENOUS
  Filled 2013-11-21: qty 2

## 2013-11-21 MED ORDER — DEXAMETHASONE SODIUM PHOSPHATE 10 MG/ML IJ SOLN
10.0000 mg | Freq: Once | INTRAMUSCULAR | Status: AC
  Administered 2016-02-16: 10 mg via INTRAVENOUS

## 2013-11-21 MED ORDER — DEXAMETHASONE SODIUM PHOSPHATE 10 MG/ML IJ SOLN
10.0000 mg | Freq: Once | INTRAMUSCULAR | Status: AC
  Administered 2013-11-21: 10 mg via INTRAVENOUS
  Filled 2013-11-21: qty 1

## 2013-11-21 NOTE — ED Provider Notes (Signed)
CSN: [id number]     Arrival date & time 11/21/13  0702 History  This chart was scribed for Emily Diego, MD by Tula Nakayama, ED Scribe. This patient was seen in room APA10/APA10 and the patient's care was started at 7:35 AM.   Chief Complaint  Patient presents with  . Migraine   Patient is a 33 y.o. female presenting with migraines. The history is provided by the patient. No language interpreter was used.  Migraine This is a recurrent problem. The problem occurs every several days. The problem has not changed since onset.Associated symptoms include headaches. Pertinent negatives include no chest pain and no abdominal pain. The symptoms are relieved by medications. She has tried nothing for the symptoms.    HPI Comments: Emily [name] is a 33 y.o. female with a history of migraines and MS who presents to the Emergency Department complaining of constant, severe headache. Pt has recurrent similar headaches and was last seen in the ED on 11/12 and 11/14 for the same symptoms. She notes medication of Dilaudid, Benadryl, Decadron and Reglan helped with symptoms. Pt denies any allergies.  Past Medical History  Diagnosis Date  . Headache(784.0)     migraines  . Multiple sclerosis   . Asthma as a child  . GERD (gastroesophageal reflux disease)   . Urinary urgency   . Anxiety   . Perforated bowel 2009  . Migraines   . Depression   . Chronic back pain   . Chronic pain   . Ovarian cyst    Past Surgical History  Procedure Laterality Date  . Extremity cyst excision  1994    right leg  . 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 Colostomy closure  04/2007  . Scar revision  01/21/2011    Procedure: SCAR REVISION;  Surgeon: Hermelinda Dellen;  Location: [facility];  Service: Plastics;  Laterality: N/A;  exploration of scar of abdomen and repair of defect  . Hernia repair    . Abdominal surgery    . Appendectomy     Family History  Problem Relation Age of Onset  .  Diabetes Mother   . Hypertension Mother   . Diabetes Father   . Hypertension Father    History  Substance Use Topics  . Smoking status: Current Some Day Smoker -- 0.40 packs/day for 10 years    Types: Cigarettes  . Smokeless tobacco: Never Used     Comment: quit smoking 10 days ago  . Alcohol Use: Yes     Comment: Rare   OB History    No data available     Review of Systems  Constitutional: Negative for appetite change and fatigue.  HENT: Negative for congestion, ear discharge and sinus pressure.   Eyes: Negative for discharge.  Respiratory: Negative for cough.   Cardiovascular: Negative for chest pain.  Gastrointestinal: Negative for abdominal pain and diarrhea.  Genitourinary: Negative for frequency and hematuria.  Musculoskeletal: Negative for back pain.  Skin: Negative for rash.  Neurological: Positive for headaches. Negative for seizures.  Psychiatric/Behavioral: Negative for hallucinations.    Allergies  Amitriptyline; Baclofen; Cymbalta; Gabapentin; Monosodium glutamate; Vicodin; Alprazolam; Magnesium salicylate; Rizatriptan; Tizanidine; Toradol; Adhesive; and Lamotrigine  Home Medications   Prior to Admission medications   Medication Sig Start Date End Date Taking? Authorizing Provider  Alum & Mag Hydroxide-Simeth (MAGIC MOUTHWASH W/LIDOCAINE) SOLN Take 5 mLs by mouth 3 (three) times daily as needed (For reflux pain). 10/24/13  Tanna Furry, MD  busPIRone (BUSPAR) 5 MG tablet Take 5 mg by mouth 3 (three) times daily.    Historical Provider, MD  carbamazepine (CARBATROL) 200 MG 12 hr capsule Take 200 mg by mouth 3 (three) times daily. 04/22/13 04/22/14  Historical Provider, MD  carisoprodol (SOMA) 350 MG tablet Take 1 tablet (350 mg total) by mouth 3 (three) times daily. 11/01/13   Lenox Ahr, PA-C  celecoxib (CELEBREX) 100 MG capsule Take 1 capsule (100 mg total) by mouth 2 (two) times daily. 11/01/13   Lenox Ahr, PA-C  clonazePAM (KLONOPIN) 1 MG tablet  Take 1 mg by mouth 2 (two) times daily as needed for anxiety.     Historical Provider, MD  cyclobenzaprine (FLEXERIL) 10 MG tablet Take 10 mg by mouth 3 (three) times daily as needed for muscle spasms.    Historical Provider, MD  diphenhydrAMINE (BENADRYL) 25 MG tablet Take 50 mg by mouth every 8 (eight) hours as needed for sleep.     Historical Provider, MD  EPINEPHrine (EPIPEN) 0.3 mg/0.3 mL SOAJ injection Inject 0.3 mg into the muscle as needed (allergic reaction).     Historical Provider, MD  Fingolimod HCl (GILENYA) 0.5 MG CAPS Take 0.5 mg by mouth daily.     Historical Provider, MD  loratadine (CLARITIN) 10 MG tablet Take 10 mg by mouth daily.     Historical Provider, MD  medroxyPROGESTERone (DEPO-PROVERA) 150 MG/ML injection Inject 150 mg into the muscle every 3 (three) months. Last injection mid September    Historical Provider, MD  morphine (MSIR) 15 MG tablet Take 15 mg by mouth 4 (four) times daily as needed. pain 11/03/13   Historical Provider, MD  omeprazole (PRILOSEC) 40 MG capsule Take 40 mg by mouth daily.     Historical Provider, MD  ondansetron (ZOFRAN) 8 MG tablet Take 8 mg by mouth every 6 (six) hours as needed for nausea or vomiting.     Historical Provider, MD  potassium chloride (K-DUR) 10 MEQ tablet Take 10 mEq by mouth 2 (two) times daily.    Historical Provider, MD  pregabalin (LYRICA) 200 MG capsule Take 600 mg by mouth at bedtime.    Historical Provider, MD  promethazine (PHENERGAN) 25 MG tablet Take 25 mg by mouth every 6 (six) hours as needed for nausea or vomiting.     Historical Provider, MD  propranolol (INDERAL) 10 MG tablet Take 10 mg by mouth 2 (two) times daily.    Historical Provider, MD  ranitidine (ZANTAC) 300 MG capsule Take 300 mg by mouth at bedtime.     Historical Provider, MD  topiramate (TOPAMAX) 100 MG tablet Take 100 mg by mouth 2 (two) times daily.     Historical Provider, MD  venlafaxine (EFFEXOR) 75 MG tablet Take 75 mg by mouth 3 (three) times daily  with meals.    Historical Provider, MD   There were no vitals taken for this visit. Physical Exam  Constitutional: She is oriented to person, place, and time. She appears well-developed.  Anxious  Moderately upset  HENT:  Head: Normocephalic.  Eyes: Conjunctivae and EOM are normal. No scleral icterus.  Neck: Neck supple. No thyromegaly present.  Cardiovascular: Normal rate and regular rhythm.  Exam reveals no gallop and no friction rub.   No murmur heard. Pulmonary/Chest: No stridor. She has no wheezes. She has no rales. She exhibits no tenderness.  Abdominal: She exhibits no distension. There is no tenderness. There is no rebound.  Musculoskeletal: Normal range of  motion. She exhibits no edema.  Lymphadenopathy:    She has no cervical adenopathy.  Neurological: She is oriented to person, place, and time. She exhibits normal muscle tone. Coordination normal.  Skin: No rash noted. No erythema.  Psychiatric: She has a normal mood and affect. Her behavior is normal.  Nursing note and vitals reviewed.   ED Course  Procedures (including critical care time) DIAGNOSTIC STUDIES: Oxygen Saturation is 100% on RA, normal by my interpretation.    COORDINATION OF CARE: 7:41 AM Discussed treatment plan with pt which includes pain medication and pt agreed to plan. Advised pt to follow up with neurologist.  Labs Review Labs Reviewed - No data to display  Imaging Review No results found.   EKG Interpretation None      MDM   Final diagnoses:  None   The chart was scribed for me under my direct supervision.  I personally performed the history, physical, and medical decision making and all procedures in the evaluation of this patient.Emily Diego, MD 11/21/13 [phone]

## 2013-11-21 NOTE — ED Notes (Signed)
Dr. Roderic Palau aware of bp and instructs for pt to have bp rechecked Tuesday at neuro office.

## 2013-11-21 NOTE — Discharge Instructions (Signed)
Follow up with your neurologist as planned tuesday

## 2013-11-21 NOTE — ED Notes (Signed)
Migraine headache since yesterday.  [facility]en at [facility] yesterday with same. Pt hypertensive.  States she is normally hypertensive with pain

## 2013-11-22 ENCOUNTER — Encounter (HOSPITAL_COMMUNITY): Payer: Self-pay | Admitting: *Deleted

## 2013-11-22 ENCOUNTER — Emergency Department (HOSPITAL_COMMUNITY)
Admission: EM | Admit: 2013-11-22 | Discharge: 2013-11-22 | Disposition: A | Discharge status: Home / Self Care | Payer: Medicare Other | Attending: Emergency Medicine | Admitting: Emergency Medicine

## 2013-11-22 DIAGNOSIS — Z79818 Long term (current) use of other agents affecting estrogen receptors and estrogen levels: Secondary | ICD-10-CM | POA: Diagnosis not present

## 2013-11-22 DIAGNOSIS — G35 Multiple sclerosis: Secondary | ICD-10-CM | POA: Diagnosis not present

## 2013-11-22 DIAGNOSIS — Z79899 Other long term (current) drug therapy: Secondary | ICD-10-CM | POA: Insufficient documentation

## 2013-11-22 DIAGNOSIS — K219 Gastro-esophageal reflux disease without esophagitis: Secondary | ICD-10-CM | POA: Insufficient documentation

## 2013-11-22 DIAGNOSIS — G8929 Other chronic pain: Secondary | ICD-10-CM | POA: Insufficient documentation

## 2013-11-22 DIAGNOSIS — Z791 Long term (current) use of non-steroidal anti-inflammatories (NSAID): Secondary | ICD-10-CM | POA: Insufficient documentation

## 2013-11-22 DIAGNOSIS — F419 Anxiety disorder, unspecified: Secondary | ICD-10-CM | POA: Diagnosis not present

## 2013-11-22 DIAGNOSIS — G43809 Other migraine, not intractable, without status migrainosus: Secondary | ICD-10-CM

## 2013-11-22 DIAGNOSIS — F329 Major depressive disorder, single episode, unspecified: Secondary | ICD-10-CM | POA: Diagnosis not present

## 2013-11-22 DIAGNOSIS — J45909 Unspecified asthma, uncomplicated: Secondary | ICD-10-CM | POA: Insufficient documentation

## 2013-11-22 DIAGNOSIS — G43909 Migraine, unspecified, not intractable, without status migrainosus: Secondary | ICD-10-CM | POA: Diagnosis present

## 2013-11-22 DIAGNOSIS — Z8742 Personal history of other diseases of the female genital tract: Secondary | ICD-10-CM | POA: Diagnosis not present

## 2013-11-22 DIAGNOSIS — Z720 Tobacco use: Secondary | ICD-10-CM | POA: Insufficient documentation

## 2013-11-22 MED ORDER — METOCLOPRAMIDE HCL 5 MG/ML IJ SOLN
10.0000 mg | Freq: Once | INTRAMUSCULAR | Status: AC
  Administered 2013-11-22: 10 mg via INTRAMUSCULAR
  Filled 2013-11-22: qty 2

## 2013-11-22 MED ORDER — DIPHENHYDRAMINE HCL 50 MG/ML IJ SOLN
50.0000 mg | Freq: Once | INTRAMUSCULAR | Status: AC
  Administered 2013-11-22: 50 mg via INTRAMUSCULAR
  Filled 2013-11-22: qty 1

## 2013-11-22 MED ORDER — DEXAMETHASONE SODIUM PHOSPHATE 10 MG/ML IJ SOLN
10.0000 mg | Freq: Once | INTRAMUSCULAR | Status: AC
  Administered 2013-11-22: 10 mg via INTRAMUSCULAR
  Filled 2013-11-22: qty 1

## 2013-11-22 NOTE — ED Notes (Signed)
Pt states she was seen here yesterday for migraine, went home and began vomiting at 0300, stating migraine is back.

## 2013-11-22 NOTE — Discharge Instructions (Signed)
Follow-up with your neurologist tomorrow as scheduled.   Migraine Headache A migraine headache is an intense, throbbing pain on one or both sides of your head. A migraine can last for 30 minutes to several hours. CAUSES  The exact cause of a migraine headache is not always known. However, a migraine may be caused when nerves in the brain become irritated and release chemicals that cause inflammation. This causes pain. Certain things may also trigger migraines, such as:  Alcohol.  Smoking.  Stress.  Menstruation.  Aged cheeses.  Foods or drinks that contain nitrates, glutamate, aspartame, or tyramine.  Lack of sleep.  Chocolate.  Caffeine.  Hunger.  Physical exertion.  Fatigue.  Medicines used to treat chest pain (nitroglycerine), birth control pills, estrogen, and some blood pressure medicines. SIGNS AND SYMPTOMS  Pain on one or both sides of your head.  Pulsating or throbbing pain.  Severe pain that prevents daily activities.  Pain that is aggravated by any physical activity.  Nausea, vomiting, or both.  Dizziness.  Pain with exposure to bright lights, loud noises, or activity.  General sensitivity to bright lights, loud noises, or smells. Before you get a migraine, you may get warning signs that a migraine is coming (aura). An aura may include:  Seeing flashing lights.  Seeing bright spots, halos, or zigzag lines.  Having tunnel vision or blurred vision.  Having feelings of numbness or tingling.  Having trouble talking.  Having muscle weakness. DIAGNOSIS  A migraine headache is often diagnosed based on:  Symptoms.  Physical exam.  A CT scan or MRI of your head. These imaging tests cannot diagnose migraines, but they can help rule out other causes of headaches. TREATMENT Medicines may be given for pain and nausea. Medicines can also be given to help prevent recurrent migraines.  HOME CARE INSTRUCTIONS  Only take over-the-counter or  prescription medicines for pain or discomfort as directed by your health care provider. The use of long-term narcotics is not recommended.  Lie down in a dark, quiet room when you have a migraine.  Keep a journal to find out what may trigger your migraine headaches. For example, write down:  What you eat and drink.  How much sleep you get.  Any change to your diet or medicines.  Limit alcohol consumption.  Quit smoking if you smoke.  Get 7-9 hours of sleep, or as recommended by your health care provider.  Limit stress.  Keep lights dim if bright lights bother you and make your migraines worse. SEEK IMMEDIATE MEDICAL CARE IF:   Your migraine becomes severe.  You have a fever.  You have a stiff neck.  You have vision loss.  You have muscular weakness or loss of muscle control.  You start losing your balance or have trouble walking.  You feel faint or pass out.  You have severe symptoms that are different from your first symptoms. MAKE SURE YOU:   Understand these instructions.  Will watch your condition.  Will get help right away if you are not doing well or get worse. Document Released: 12/24/2004 Document Revised: 05/10/2013 Document Reviewed: 08/31/2012 [facility] Patient Information 2015 [place], [place]. This information is not intended to replace advice given to you by your health care provider. Make sure you discuss any questions you have with your health care provider.

## 2013-11-22 NOTE — ED Provider Notes (Signed)
CSN: [id number]     Arrival date & time 11/22/13  0700 History  This chart was scribed for Emily Speak, MD by Chester Holstein, ED Scribe. This patient was seen in room APA03/APA03 and the patient's care was started at 7:14 AM.    Chief Complaint  Patient presents with  . Migraine      The history is provided by the patient. No language interpreter was used.    HPI Comments: Emily [name] is a 33 y.o. female with a history of migraines who presents to the Emergency Department complaining of a ongoing intermittent migraine. Pt notes associated vomiting begin this morning at 3am.  Pt presented to the ED 4 days ago and again yesterday and was seen by Dr. Roderic Palau. Pt has an appointment with a neurologist tomorrow morning at [facility].    Past Medical History  Diagnosis Date  . Headache(784.0)     migraines  . Multiple sclerosis   . Asthma as a child  . GERD (gastroesophageal reflux disease)   . Urinary urgency   . Anxiety   . Perforated bowel 2009  . Migraines   . Depression   . Chronic back pain   . Chronic pain   . Ovarian cyst    Past Surgical History  Procedure Laterality Date  . Extremity cyst excision  1994    right leg  . 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 Colostomy closure  04/2007  . Scar revision  01/21/2011    Procedure: SCAR REVISION;  Surgeon: Hermelinda Dellen;  Location: [facility];  Service: Plastics;  Laterality: N/A;  exploration of scar of abdomen and repair of defect  . Hernia repair    . Abdominal surgery    . Appendectomy     Family History  Problem Relation Age of Onset  . Diabetes Mother   . Hypertension Mother   . Diabetes Father   . Hypertension Father    History  Substance Use Topics  . Smoking status: Current Some Day Smoker -- 0.40 packs/day for 10 years    Types: Cigarettes  . Smokeless tobacco: Never Used     Comment: quit smoking 10 days ago  . Alcohol Use: Yes     Comment: Rare    OB History    No data available     Review of Systems A complete 10 system review of systems was obtained and all systems are negative except as noted in the HPI and PMH.     Allergies  Amitriptyline; Baclofen; Cymbalta; Gabapentin; Monosodium glutamate; Vicodin; Alprazolam; Magnesium salicylate; Rizatriptan; Tizanidine; Toradol; Adhesive; and Lamotrigine  Home Medications   Prior to Admission medications   Medication Sig Start Date End Date Taking? Authorizing Provider  Alum & Mag Hydroxide-Simeth (MAGIC MOUTHWASH W/LIDOCAINE) SOLN Take 5 mLs by mouth 3 (three) times daily as needed (For reflux pain). 10/24/13   Tanna Furry, MD  busPIRone (BUSPAR) 5 MG tablet Take 5 mg by mouth 3 (three) times daily.    Historical Provider, MD  carbamazepine (CARBATROL) 200 MG 12 hr capsule Take 200 mg by mouth 3 (three) times daily. 04/22/13 04/22/14  Historical Provider, MD  carisoprodol (SOMA) 350 MG tablet Take 1 tablet (350 mg total) by mouth 3 (three) times daily. 11/01/13   Lenox Ahr, PA-C  celecoxib (CELEBREX) 100 MG capsule Take 1 capsule (100 mg total) by mouth 2 (two) times daily. 11/01/13   Lenox Ahr, PA-C  clonazePAM (  KLONOPIN) 1 MG tablet Take 1 mg by mouth 2 (two) times daily as needed for anxiety.     Historical Provider, MD  cyclobenzaprine (FLEXERIL) 10 MG tablet Take 10 mg by mouth 3 (three) times daily as needed for muscle spasms.    Historical Provider, MD  diphenhydrAMINE (BENADRYL) 25 MG tablet Take 50 mg by mouth every 8 (eight) hours as needed for sleep.     Historical Provider, MD  EPINEPHrine (EPIPEN) 0.3 mg/0.3 mL SOAJ injection Inject 0.3 mg into the muscle as needed (allergic reaction).     Historical Provider, MD  Fingolimod HCl (GILENYA) 0.5 MG CAPS Take 0.5 mg by mouth daily.     Historical Provider, MD  loratadine (CLARITIN) 10 MG tablet Take 10 mg by mouth daily.     Historical Provider, MD  medroxyPROGESTERone (DEPO-PROVERA) 150 MG/ML injection Inject 150 mg  into the muscle every 3 (three) months. Last injection mid September    Historical Provider, MD  morphine (MSIR) 15 MG tablet Take 15 mg by mouth 4 (four) times daily as needed. pain 11/03/13   Historical Provider, MD  omeprazole (PRILOSEC) 40 MG capsule Take 40 mg by mouth daily.     Historical Provider, MD  ondansetron (ZOFRAN) 8 MG tablet Take 8 mg by mouth every 6 (six) hours as needed for nausea or vomiting.     Historical Provider, MD  potassium chloride (K-DUR) 10 MEQ tablet Take 10 mEq by mouth 2 (two) times daily.    Historical Provider, MD  pregabalin (LYRICA) 200 MG capsule Take 600 mg by mouth at bedtime.    Historical Provider, MD  promethazine (PHENERGAN) 25 MG tablet Take 25 mg by mouth every 6 (six) hours as needed for nausea or vomiting.     Historical Provider, MD  propranolol (INDERAL) 10 MG tablet Take 10 mg by mouth 2 (two) times daily.    Historical Provider, MD  ranitidine (ZANTAC) 300 MG capsule Take 300 mg by mouth at bedtime.     Historical Provider, MD  topiramate (TOPAMAX) 100 MG tablet Take 100 mg by mouth 2 (two) times daily.     Historical Provider, MD  venlafaxine (EFFEXOR) 75 MG tablet Take 75 mg by mouth 3 (three) times daily with meals.    Historical Provider, MD   BP 163/124 mmHg  Pulse 109  Temp(Src) 97.7 F (36.5 C) (Oral)  Resp 20  Ht 5\' 5"  (1.651 m)  Wt 218 lb (98.884 kg)  BMI 36.28 kg/m2  SpO2 100% Physical Exam  Constitutional: She is oriented to person, place, and time. She appears well-developed and well-nourished.  HENT:  Head: Normocephalic.  Eyes: Conjunctivae and EOM are normal. Pupils are equal, round, and reactive to light.  No papilledema  Neck: Normal range of motion. Neck supple.  Cardiovascular: Normal rate and regular rhythm.   Pulmonary/Chest: Effort normal.  Musculoskeletal: Normal range of motion.  Neurological: She is alert and oriented to person, place, and time. No cranial nerve deficit. She exhibits normal muscle tone.  Coordination normal.  Skin: Skin is warm and dry.  Psychiatric: She has a normal mood and affect. Her behavior is normal.  Nursing note and vitals reviewed.   ED Course  Procedures (including critical care time) DIAGNOSTIC STUDIES: Oxygen Saturation is 100% on room air, normal by my interpretation.    COORDINATION OF CARE: 7:19 AM Discussed treatment plan with patient at beside, the patient agrees with the plan and has no further questions at this time.  Labs Review Labs Reviewed - No data to display  Imaging Review No results found.   EKG Interpretation None      MDM   Final diagnoses:  None    Patient is feeling somewhat better with medications given in the ER. She tells me she has an appointment with her neurologist tomorrow. I have advised her to keep this appointment to discuss the frequency of her headaches. I see no indication for repeat CT has one was performed several weeks ago, she is neurologically intact, and this is consistent with her prior migraines.   I personally performed the services described in this documentation, which was scribed in my presence. The recorded information has been reviewed and is accurate.       Emily Speak, MD 11/22/13 [phone]

## 2013-11-23 ENCOUNTER — Ambulatory Visit (INDEPENDENT_AMBULATORY_CARE_PROVIDER_SITE_OTHER): Payer: Medicare Other | Admitting: Diagnostic Neuroimaging

## 2013-11-23 ENCOUNTER — Encounter: Payer: Self-pay | Admitting: Diagnostic Neuroimaging

## 2013-11-23 VITALS — BP 180/124 | HR 104 | Temp 98.3°F | Ht 65.5 in | Wt 218.8 lb

## 2013-11-23 DIAGNOSIS — G43109 Migraine with aura, not intractable, without status migrainosus: Secondary | ICD-10-CM

## 2013-11-23 DIAGNOSIS — G894 Chronic pain syndrome: Secondary | ICD-10-CM

## 2013-11-23 DIAGNOSIS — G35 Multiple sclerosis: Secondary | ICD-10-CM

## 2013-11-23 DIAGNOSIS — G35D Multiple sclerosis, unspecified: Secondary | ICD-10-CM

## 2013-11-23 DIAGNOSIS — M792 Neuralgia and neuritis, unspecified: Secondary | ICD-10-CM

## 2013-11-23 DIAGNOSIS — F411 Generalized anxiety disorder: Secondary | ICD-10-CM

## 2013-11-23 MED ORDER — PROPRANOLOL HCL 60 MG PO TABS: 60.0000 mg | ORAL_TABLET | Freq: Two times a day (BID) | ORAL | Status: DC

## 2013-11-23 NOTE — Progress Notes (Signed)
[facility]  PATIENT: Emily [name] DOB: 09/06/1980  REFERRING CLINICIAN: [place] HISTORY FROM: patient and finance REASON FOR VISIT: follow up   HISTORICAL  CHIEF COMPLAINT:  Chief Complaint  Patient presents with  . Follow-up    MS    HISTORY OF PRESENT ILLNESS:   UPDATE 11/23/13: Since last visit patient has struggled significantly with multiple emergency room visits for migraine headaches, chronic pain, stomach pain, emesis. Patient struggling to get established with pain management clinic. Patient tells me that she previously tried Botox for migraine in the past and this helped.  PRIOR HPI (10/06/13): 33 year old redheaded female here for evaluation of multiple sclerosis and transfer of care. 2008 patient was living in [place] state, had right thigh pain and right eye blurred vision. Patient to the hospital emergency room, diagnosed with right optic neuritis. Patient had MRI of the brain which showed "20 active lesions" according to patient. Spinal tap showed abnormalities consistent with multiple sclerosis. Patient was treated with IV steroids and started on Copaxone soon after. Patient started on Copaxone for 2008 until 2010. Then she was switched to Betaseron until 2011. 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 Patient continues to have blurred vision, neuropathic pain in her feet and legs, bilateral lower 70 weakness, increased urinary urgency. Patient also is migraines consisting of right-sided aching and throbbing sensation, once per week, associated with nausea, vomiting, photophobia, phonophobia, dizziness, seeing stars sparkling lights. Patient has tried Imitrex and Maxalt without relief. She is currently on Topamax, propranolol and Depakote for migraine prevention. No specific triggering factors. Other factors include depression,  anxiety, insomnia, memory loss. Patient previously evaluated by neurologist at [facility] (Dr. George Hugh) and [facility] (Dr. Macario Carls). Now looking to establish with a local neurologist near her home.   REVIEW OF SYSTEMS: Full 14 system review of systems performed and notable only for chills fatigue fever unexpected weight change excessive sweating facial swelling trouble swallowing insomnia back pain neck pain neck stiffness nervousness anxiety agitation dizziness headache.   ALLERGIES: Allergies  Allergen Reactions  . Amitriptyline Hypertension and Other (See Comments)    hypertension  . Baclofen Hives and Shortness Of Breath  . Cymbalta [Duloxetine Hcl] Shortness Of Breath and Rash  . Gabapentin Shortness Of Breath and Rash  . Monosodium Glutamate Anaphylaxis  . Vicodin [Hydrocodone-Acetaminophen] Hives and Nausea And Vomiting    Projectile vomiting  . Alprazolam Other (See Comments)    Lethargy  . Magnesium Salicylate Hives and Itching  . Rizatriptan Nausea And Vomiting and Other (See Comments)    GI upset, Projectile vomiting  . Tizanidine Hives  . Toradol [Ketorolac Tromethamine] Nausea Only  . Adhesive [Tape] Other (See Comments)    Skin irritation  . Lamotrigine Rash    HOME MEDICATIONS: Outpatient Prescriptions Prior to Visit  Medication Sig Dispense Refill  . carbamazepine (CARBATROL) 200 MG 12 hr capsule Take 200 mg by mouth 3 (three) times daily.    . celecoxib (CELEBREX) 100 MG capsule Take 1 capsule (100 mg total) by mouth 2 (two) times daily. 12 capsule 0  . cyclobenzaprine (FLEXERIL) 10 MG tablet Take 10 mg by mouth 3 (three) times daily as needed for muscle spasms.    . diphenhydrAMINE (BENADRYL) 25 MG tablet Take 50 mg by mouth every 8 (eight) hours as needed for sleep.     Marland Kitchen EPINEPHrine (EPIPEN) 0.3 mg/0.3 mL SOAJ  injection Inject 0.3 mg into the muscle as needed (allergic reaction).     . Fingolimod HCl (GILENYA) 0.5 MG CAPS Take 0.5 mg by mouth daily.     Marland Kitchen  loratadine (CLARITIN) 10 MG tablet Take 10 mg by mouth daily.     . medroxyPROGESTERone (DEPO-PROVERA) 150 MG/ML injection Inject 150 mg into the muscle every 3 (three) months. Last injection mid September    . omeprazole (PRILOSEC) 40 MG capsule Take 40 mg by mouth daily.     . ondansetron (ZOFRAN) 8 MG tablet Take 8 mg by mouth every 6 (six) hours as needed for nausea or vomiting.     . potassium chloride (K-DUR) 10 MEQ tablet Take 10 mEq by mouth 2 (two) times daily.    . pregabalin (LYRICA) 200 MG capsule Take 600 mg by mouth at bedtime.    . promethazine (PHENERGAN) 25 MG tablet Take 25 mg by mouth every 6 (six) hours as needed for nausea or vomiting.     . ranitidine (ZANTAC) 300 MG capsule Take 300 mg by mouth at bedtime.     . topiramate (TOPAMAX) 100 MG tablet Take 100 mg by mouth 2 (two) times daily.     Marland Kitchen venlafaxine (EFFEXOR) 75 MG tablet Take 75 mg by mouth 3 (three) times daily with meals.    . propranolol (INDERAL) 10 MG tablet Take 10 mg by mouth 2 (two) times daily.    . Alum & Mag Hydroxide-Simeth (MAGIC MOUTHWASH W/LIDOCAINE) SOLN Take 5 mLs by mouth 3 (three) times daily as needed (For reflux pain). 90 mL 1  . busPIRone (BUSPAR) 5 MG tablet Take 5 mg by mouth 3 (three) times daily.    . carisoprodol (SOMA) 350 MG tablet Take 1 tablet (350 mg total) by mouth 3 (three) times daily. 21 tablet 0  . clonazePAM (KLONOPIN) 1 MG tablet Take 1 mg by mouth 2 (two) times daily as needed for anxiety.     Marland Kitchen morphine (MSIR) 15 MG tablet Take 15 mg by mouth 4 (four) times daily as needed. pain  0   Facility-Administered Medications Prior to Visit  Medication Dose Route Frequency Provider Last Rate Last Dose  . dexamethasone (DECADRON) injection 10 mg  10 mg Intravenous Once Maudry Diego, MD      . diphenhydrAMINE (BENADRYL) injection 50 mg  50 mg Intravenous Once Maudry Diego, MD      . HYDROmorphone (DILAUDID) injection 1 mg  1 mg Intravenous Once Maudry Diego, MD      .  metoCLOPramide (REGLAN) injection 10 mg  10 mg Intravenous Once Maudry Diego, MD        PAST MEDICAL HISTORY: Past Medical History  Diagnosis Date  . Headache(784.0)     migraines  . Multiple sclerosis   . Asthma as a child  . GERD (gastroesophageal reflux disease)   . Urinary urgency   . Anxiety   . Perforated bowel 2009  . Migraines   . Depression   . Chronic back pain   . Chronic pain   . Ovarian cyst     PAST SURGICAL HISTORY: Past Surgical History  Procedure Laterality Date  . Extremity cyst excision  1994    right leg  . 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 Colostomy closure  04/2007  . Scar revision  01/21/2011    Procedure: SCAR REVISION;  Surgeon: Hermelinda Dellen;  Location: [facility];  Service: Plastics;  Laterality: N/A;  exploration of scar of abdomen and repair of defect  . Hernia repair    . Abdominal surgery    . Appendectomy      FAMILY HISTORY: Family History  Problem Relation Age of Onset  . Diabetes Mother   . Hypertension Mother   . Diabetes Father   . Hypertension Father     SOCIAL HISTORY:  History   Social History  . Marital Status: Single    Spouse Name: N/A    Number of Children: 0  . Years of Education: College   Occupational History  .  Other    disability   Social History Main Topics  . Smoking status: Current Some Day Smoker -- 0.40 packs/day for 10 years    Types: Cigarettes  . Smokeless tobacco: Never Used     Comment: quit smoking 10 days ago  . Alcohol Use: Yes     Comment: Rare  . Drug Use: No  . Sexual Activity: Yes    Birth Control/ Protection: Injection   Other Topics Concern  . Not on file   Social History Narrative   Patient lives at home with fiance.   Caffeine Use: 1 20oz soda daily     PHYSICAL EXAM  Filed Vitals:   11/23/13 0949  BP: 180/124  Pulse: 104  Temp: 98.3 F (36.8 C)  TempSrc: Oral  Height: 5' 5.5" (1.664 m)  Weight: 218 lb 12.8 oz (99.247 kg)     Not recorded      Body mass index is 35.84 kg/(m^2).  GENERAL EXAM: Patient is in no distress; well developed, nourished and groomed; neck is supple  CARDIOVASCULAR: Regular rate and rhythm, no murmurs, no carotid bruits  NEUROLOGIC: MENTAL STATUS: awake, alert, oriented to person, place and time, recent and remote memory intact, normal attention and concentration, language fluent, comprehension intact, naming intact, fund of knowledge appropriate CRANIAL NERVE: no papilledema on fundoscopic exam, PUPILS ARE DILATED AND MILDLY REACTIVE; MILD RIGHT EYE RELATIVE APD; RIGHT EYE MORE SENS TO LIGHT; visual fields full to confrontation, extraocular muscles intact, no nystagmus, facial sensation and strength symmetric, hearing intact, palate elevates symmetrically, uvula midline, shoulder shrug symmetric, tongue midline. MOTOR: normal bulk and tone, full strength in the BUE, BLE SENSORY: normal and symmetric to light touch, pinprick, temperature, vibration COORDINATION: finger-nose-finger, fine finger movements normal REFLEXES: deep tendon reflexes present and symmetric GAIT/STATION: narrow based gait; able to walk on toes; UNSTEADY HEEL GAIT; romberg is negative    DIAGNOSTIC DATA (LABS, IMAGING, TESTING) - I reviewed patient records, labs, notes, testing and imaging myself where available.  Lab Results  Component Value Date   WBC 7.2 11/06/2013   HGB 12.8 11/06/2013   HCT 38.0 11/06/2013   MCV 90.9 11/06/2013   PLT 300 11/06/2013      Component Value Date/Time   NA 143 11/06/2013 1659   K 3.8 11/06/2013 1659   CL 106 11/06/2013 1659   CO2 24 11/06/2013 1659   GLUCOSE 75 11/06/2013 1659   BUN 15 11/06/2013 1659   CREATININE 0.69 11/06/2013 1659   CALCIUM 8.5 11/06/2013 1659   PROT 6.5 11/06/2013 1659   ALBUMIN 3.2* 11/06/2013 1659   AST 15 11/06/2013 1659   ALT 14 11/06/2013 1659   ALKPHOS 70 11/06/2013 1659   BILITOT 0.2* 11/06/2013 1659   GFRNONAA >90 11/06/2013 1659    GFRAA >90 11/06/2013 1659   No results found for: CHOL No results found for: HGBA1C No results found for:  VITAMINB12 No results found for: TSH  I reviewed images myself and agree with interpretation. -VRP  10/02/13 MRI BRAIN - Minimal progression of cerebral white matter lesions, consistent with multiple sclerosis. No evidence of active demyelination.   ASSESSMENT AND PLAN  33 y.o. year old female here with multiple sclerosis since 2008. Also with migraine and chronic neuropathy pain.  PLAN: - continue gilenya  - continue topiramate to 100mg  BID - increase propranolol to 60mg  twice a day - establish with new PCP (has appointment on Thursday) - narcotic mgmt by pain clinic - anxiety/depression mgmt by psychiatry  Return in about 2 weeks (around 12/07/2013).    Penni Bombard, MD 25/95/6387, 56:43 PM Certified in Neurology, Neurophysiology and Neuroimaging  [facility] [address] [place], [place] [address] [phone]

## 2013-11-23 NOTE — Patient Instructions (Signed)
Increase propranolol to 60mg  twice a day.  Establish with new primary care clinic; then request referrals to pain management and psychiatry.

## 2013-11-24 ENCOUNTER — Telehealth: Payer: Self-pay | Admitting: Diagnostic Neuroimaging

## 2013-11-24 ENCOUNTER — Encounter (HOSPITAL_COMMUNITY): Payer: Self-pay | Admitting: *Deleted

## 2013-11-24 ENCOUNTER — Emergency Department (HOSPITAL_COMMUNITY)
Admission: EM | Admit: 2013-11-24 | Discharge: 2013-11-24 | Disposition: A | Discharge status: Home / Self Care | Payer: Medicare Other | Attending: Emergency Medicine | Admitting: Emergency Medicine

## 2013-11-24 DIAGNOSIS — G8929 Other chronic pain: Secondary | ICD-10-CM | POA: Insufficient documentation

## 2013-11-24 DIAGNOSIS — G43009 Migraine without aura, not intractable, without status migrainosus: Secondary | ICD-10-CM | POA: Diagnosis not present

## 2013-11-24 DIAGNOSIS — F419 Anxiety disorder, unspecified: Secondary | ICD-10-CM | POA: Diagnosis not present

## 2013-11-24 DIAGNOSIS — R51 Headache: Secondary | ICD-10-CM | POA: Diagnosis present

## 2013-11-24 DIAGNOSIS — K219 Gastro-esophageal reflux disease without esophagitis: Secondary | ICD-10-CM | POA: Diagnosis not present

## 2013-11-24 DIAGNOSIS — F329 Major depressive disorder, single episode, unspecified: Secondary | ICD-10-CM | POA: Diagnosis not present

## 2013-11-24 DIAGNOSIS — J45909 Unspecified asthma, uncomplicated: Secondary | ICD-10-CM | POA: Insufficient documentation

## 2013-11-24 DIAGNOSIS — Z79899 Other long term (current) drug therapy: Secondary | ICD-10-CM | POA: Insufficient documentation

## 2013-11-24 DIAGNOSIS — Z8742 Personal history of other diseases of the female genital tract: Secondary | ICD-10-CM | POA: Diagnosis not present

## 2013-11-24 DIAGNOSIS — I10 Essential (primary) hypertension: Secondary | ICD-10-CM | POA: Diagnosis not present

## 2013-11-24 DIAGNOSIS — Z720 Tobacco use: Secondary | ICD-10-CM | POA: Diagnosis not present

## 2013-11-24 MED ORDER — CHLORTHALIDONE 25 MG PO TABS: 25.0000 mg | ORAL_TABLET | Freq: Every day | ORAL | Status: DC

## 2013-11-24 MED ORDER — DEXAMETHASONE SODIUM PHOSPHATE 10 MG/ML IJ SOLN
10.0000 mg | Freq: Once | INTRAMUSCULAR | Status: AC
  Administered 2013-11-24: 10 mg via INTRAMUSCULAR
  Filled 2013-11-24: qty 1

## 2013-11-24 MED ORDER — DIPHENHYDRAMINE HCL 50 MG/ML IJ SOLN
50.0000 mg | Freq: Once | INTRAMUSCULAR | Status: AC
  Administered 2013-11-24: 50 mg via INTRAMUSCULAR
  Filled 2013-11-24: qty 1

## 2013-11-24 MED ORDER — METOCLOPRAMIDE HCL 5 MG/ML IJ SOLN
10.0000 mg | Freq: Once | INTRAMUSCULAR | Status: AC
  Administered 2013-11-24: 10 mg via INTRAMUSCULAR
  Filled 2013-11-24: qty 2

## 2013-11-24 NOTE — ED Notes (Signed)
Pt reports headache started 2 days ago after being seen in the ER earlier that day. Pt saw neurologist & has apt w/ PCP today.

## 2013-11-24 NOTE — Discharge Instructions (Signed)
Monitor your blood pressure at home.  Migraine Headache A migraine headache is an intense, throbbing pain on one or both sides of your head. A migraine can last for 30 minutes to several hours. CAUSES  The exact cause of a migraine headache is not always known. However, a migraine may be caused when nerves in the brain become irritated and release chemicals that cause inflammation. This causes pain. Certain things may also trigger migraines, such as:  Alcohol.  Smoking.  Stress.  Menstruation.  Aged cheeses.  Foods or drinks that contain nitrates, glutamate, aspartame, or tyramine.  Lack of sleep.  Chocolate.  Caffeine.  Hunger.  Physical exertion.  Fatigue.  Medicines used to treat chest pain (nitroglycerine), birth control pills, estrogen, and some blood pressure medicines. SIGNS AND SYMPTOMS  Pain on one or both sides of your head.  Pulsating or throbbing pain.  Severe pain that prevents daily activities.  Pain that is aggravated by any physical activity.  Nausea, vomiting, or both.  Dizziness.  Pain with exposure to bright lights, loud noises, or activity.  General sensitivity to bright lights, loud noises, or smells. Before you get a migraine, you may get warning signs that a migraine is coming (aura). An aura may include:  Seeing flashing lights.  Seeing bright spots, halos, or zigzag lines.  Having tunnel vision or blurred vision.  Having feelings of numbness or tingling.  Having trouble talking.  Having muscle weakness. DIAGNOSIS  A migraine headache is often diagnosed based on:  Symptoms.  Physical exam.  A CT scan or MRI of your head. These imaging tests cannot diagnose migraines, but they can help rule out other causes of headaches. TREATMENT Medicines may be given for pain and nausea. Medicines can also be given to help prevent recurrent migraines.  HOME CARE INSTRUCTIONS  Only take over-the-counter or prescription medicines for  pain or discomfort as directed by your health care provider. The use of long-term narcotics is not recommended.  Lie down in a dark, quiet room when you have a migraine.  Keep a journal to find out what may trigger your migraine headaches. For example, write down:  What you eat and drink.  How much sleep you get.  Any change to your diet or medicines.  Limit alcohol consumption.  Quit smoking if you smoke.  Get 7-9 hours of sleep, or as recommended by your health care provider.  Limit stress.  Keep lights dim if bright lights bother you and make your migraines worse. SEEK IMMEDIATE MEDICAL CARE IF:   Your migraine becomes severe.  You have a fever.  You have a stiff neck.  You have vision loss.  You have muscular weakness or loss of muscle control.  You start losing your balance or have trouble walking.  You feel faint or pass out.  You have severe symptoms that are different from your first symptoms. MAKE SURE YOU:   Understand these instructions.  Will watch your condition.  Will get help right away if you are not doing well or get worse. Document Released: 12/24/2004 Document Revised: 05/10/2013 Document Reviewed: 08/31/2012 [facility] Patient Information 2015 [place], [place]. This information is not intended to replace advice given to you by your health care provider. Make sure you discuss any questions you have with your health care provider.  Hypertension Hypertension, commonly called high blood pressure, is when the force of blood pumping through your arteries is too strong. Your arteries are the blood vessels that carry blood from your heart  throughout your body. A blood pressure reading consists of a higher number over a lower number, such as 110/72. The higher number (systolic) is the pressure inside your arteries when your heart pumps. The lower number (diastolic) is the pressure inside your arteries when your heart relaxes. Ideally you want your blood  pressure below 120/80. Hypertension forces your heart to work harder to pump blood. Your arteries may become narrow or stiff. Having hypertension puts you at risk for heart disease, stroke, and other problems.  RISK FACTORS Some risk factors for high blood pressure are controllable. Others are not.  Risk factors you cannot control include:   Race. You may be at higher risk if you are African American.  Age. Risk increases with age.  Gender. Men are at higher risk than women before age 15 years. After age 35, women are at higher risk than men. Risk factors you can control include:  Not getting enough exercise or physical activity.  Being overweight.  Getting too much fat, sugar, calories, or salt in your diet.  Drinking too much alcohol. SIGNS AND SYMPTOMS Hypertension does not usually cause signs or symptoms. Extremely high blood pressure (hypertensive crisis) may cause headache, anxiety, shortness of breath, and nosebleed. DIAGNOSIS  To check if you have hypertension, your health care provider will measure your blood pressure while you are seated, with your arm held at the level of your heart. It should be measured at least twice using the same arm. Certain conditions can cause a difference in blood pressure between your right and left arms. A blood pressure reading that is higher than normal on one occasion does not mean that you need treatment. If one blood pressure reading is high, ask your health care provider about having it checked again. TREATMENT  Treating high blood pressure includes making lifestyle changes and possibly taking medicine. Living a healthy lifestyle can help lower high blood pressure. You may need to change some of your habits. Lifestyle changes may include:  Following the DASH diet. This diet is high in fruits, vegetables, and whole grains. It is low in salt, red meat, and added sugars.  Getting at least 2 hours of brisk physical activity every week.  Losing  weight if necessary.  Not smoking.  Limiting alcoholic beverages.  Learning ways to reduce stress. If lifestyle changes are not enough to get your blood pressure under control, your health care provider may prescribe medicine. You may need to take more than one. Work closely with your health care provider to understand the risks and benefits. HOME CARE INSTRUCTIONS  Have your blood pressure rechecked as directed by your health care provider.   Take medicines only as directed by your health care provider. Follow the directions carefully. Blood pressure medicines must be taken as prescribed. The medicine does not work as well when you skip doses. Skipping doses also puts you at risk for problems.   Do not smoke.   Monitor your blood pressure at home as directed by your health care provider. SEEK MEDICAL CARE IF:   You think you are having a reaction to medicines taken.  You have recurrent headaches or feel dizzy.  You have swelling in your ankles.  You have trouble with your vision. SEEK IMMEDIATE MEDICAL CARE IF:  You develop a severe headache or confusion.  You have unusual weakness, numbness, or feel faint.  You have severe chest or abdominal pain.  You vomit repeatedly.  You have trouble breathing. MAKE SURE YOU:  Understand these instructions.  Will watch your condition.  Will get help right away if you are not doing well or get worse. Document Released: 12/24/2004 Document Revised: 05/10/2013 Document Reviewed: 10/16/2012 [facility] Patient Information 2015 [place], [place]. This information is not intended to replace advice given to you by your health care provider. Make sure you discuss any questions you have with your health care provider.  Chlorthalidone tablets What is this medicine? CHLORTHALIDONE (klor THAL i done) is a diuretic. It increases the amount of urine passed, which causes the body to lose salt and water. This medicine is used to treat high  blood pressure and edema or water retention. This medicine may be used for other purposes; ask your health care provider or pharmacist if you have questions. COMMON BRAND NAME(S): Thalitone What should I tell my health care provider before I take this medicine? They need to know if you have any of these conditions: -asthma -diabetes -gout -kidney disease -liver disease -parathyroid disease -systemic lupus erythematosus (SLE) -taking cortisone, digoxin, lithium carbonate, or drugs for diabetes -an unusual or allergic reaction to chlorthalidone, sulfa drugs, other medicines, foods, dyes, or preservatives -pregnant or trying to get pregnant -breast-feeding How should I use this medicine? Take this medicine by mouth with a glass of water. Follow the directions on the prescription label. It is best to take your dose in the morning with food. Take your medicine at regular intervals. Do not take your medicine more often than directed. Do not stop taking except on your doctor's advice. Talk to your pediatrician regarding the use of this medicine in children. Special care may be needed. Overdosage: If you think you have taken too much of this medicine contact a poison control center or emergency room at once. NOTE: This medicine is only for you. Do not share this medicine with others. What if I miss a dose? If you miss a dose, take it as soon as you can. If it is almost time for your next dose, take only that dose. Do not take double or extra doses. What may interact with this medicine? -barbiturate medicines for sleep or seizure control -digoxin -lithium -medicines for diabetes -norepinephrine -other medicines for high blood pressure -some pain medicines -steroid hormones like prednisone, cortisone, hydrocortisone, corticotropin -tubocurarine This list may not describe all possible interactions. Give your health care provider a list of all the medicines, herbs, non-prescription drugs, or  dietary supplements you use. Also tell them if you smoke, drink alcohol, or use illegal drugs. Some items may interact with your medicine. What should I watch for while using this medicine? Visit your doctor or health care professional for regular check ups. Check your blood pressure as directed. Ask your doctor or health care professional what your blood pressure should be and when you should contact him or her. You may need to be on a special diet while taking this medicine. Ask your doctor. You may get drowsy or dizzy. Do not drive, use machinery, or do anything that needs mental alertness until you know how this medicine affects you. Do not stand or sit up quickly, especially if you are an older patient. This reduces the risk of dizzy or fainting spells. Alcohol may interfere with the effect of this medicine. Avoid alcoholic drinks. This medicine may affect your blood sugar level. If you have diabetes, check with your doctor or health care professional before changing the dose of your diabetic medicine. This medicine can make you more sensitive to the sun. Keep  out of the sun. If you cannot avoid being in the sun, wear protective clothing and use sunscreen. Do not use sun lamps or tanning beds/booths. What side effects may I notice from receiving this medicine? Side effects that you should report to your doctor or health care professional as soon as possible: -allergic reactions like skin rash, itching or hives, swelling of the face, lips, or tongue -dark urine -dry mouth -excess thirst -fast, irregular heart rate -fever, chills -muscle pain, cramps, or spasm -nausea, vomiting -redness, blistering, peeling or loosening of the skin, including inside the mouth -tingling, pain or numbness in the hands or feet -unusually weak or tired -yellowing of the eyes or skin Side effects that usually do not require medical attention (report to your doctor or health care professional if they continue or  are bothersome): -diarrhea or constipation -headache -impotence -loss of appetite -stomach upset This list may not describe all possible side effects. Call your doctor for medical advice about side effects. You may report side effects to FDA at 1-800-FDA-1088. Where should I keep my medicine? Keep out of the reach of children. Store at room temperature between 15 and 30 degrees C (59 and 86 degrees F). Keep container tightly closed. Throw away any unused medicine after the expiration date. NOTE: This sheet is a summary. It may not cover all possible information. If you have questions about this medicine, talk to your doctor, pharmacist, or health care provider.  2015, [employer]. (2007-03-31 15:28:48)

## 2013-11-24 NOTE — ED Notes (Signed)
EDP at bedside  

## 2013-11-24 NOTE — ED Provider Notes (Signed)
CSN: [id number]     Arrival date & time 11/24/13  0127 History   First MD Initiated Contact with Patient 11/24/13 0150     Chief Complaint  Patient presents with  . Headache     (Consider location/radiation/quality/duration/timing/severity/associated sxs/prior Treatment) Patient is a 33 y.o. female presenting with headaches. The history is provided by the patient.  Headache She has a history of chronic migraine headaches and comes in with a right frontal and frontoparietal headache for the last 2 days. Pain is throbbing and she rates it at 10/10. There was no aura. She denies any visual disturbance. There has been nausea without vomiting. She denies weakness, numbness, tingling. She denies fever or pain. Pain is worse with exposure to light, noise, smells. Nothing makes it any better. She was seen in emergency department 2 days ago and did feel better after leaving only the headache recurred shortly after that. She did see her neurologist who increased her propranolol from 20 mg twice a day to 60 mg twice a day.  Past Medical History  Diagnosis Date  . Headache(784.0)     migraines  . Multiple sclerosis   . Asthma as a child  . GERD (gastroesophageal reflux disease)   . Urinary urgency   . Anxiety   . Perforated bowel 2009  . Migraines   . Depression   . Chronic back pain   . Chronic pain   . Ovarian cyst    Past Surgical History  Procedure Laterality Date  . Extremity cyst excision  1994    right leg  . 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 Colostomy closure  04/2007  . Scar revision  01/21/2011    Procedure: SCAR REVISION;  Surgeon: Hermelinda Dellen;  Location: [facility];  Service: Plastics;  Laterality: N/A;  exploration of scar of abdomen and repair of defect  . Hernia repair    . Abdominal surgery    . Appendectomy     Family History  Problem Relation Age of Onset  . Diabetes Mother   . Hypertension Mother   . Diabetes Father   .  Hypertension Father    History  Substance Use Topics  . Smoking status: Current Some Day Smoker -- 0.40 packs/day for 10 years    Types: Cigarettes  . Smokeless tobacco: Never Used     Comment: quit smoking 10 days ago  . Alcohol Use: Yes     Comment: Rare   OB History    No data available     Review of Systems  Neurological: Positive for headaches.  All other systems reviewed and are negative.     Allergies  Amitriptyline; Baclofen; Cymbalta; Gabapentin; Monosodium glutamate; Vicodin; Alprazolam; Magnesium salicylate; Rizatriptan; Tizanidine; Toradol; Adhesive; and Lamotrigine  Home Medications   Prior to Admission medications   Medication Sig Start Date End Date Taking? Authorizing Provider  Alum & Mag Hydroxide-Simeth (MAGIC MOUTHWASH W/LIDOCAINE) SOLN Take 5 mLs by mouth 3 (three) times daily as needed (For reflux pain). 10/24/13  Yes Tanna Furry, MD  carbamazepine (CARBATROL) 200 MG 12 hr capsule Take 200 mg by mouth 3 (three) times daily. 04/22/13 04/22/14 Yes Historical Provider, MD  celecoxib (CELEBREX) 100 MG capsule Take 1 capsule (100 mg total) by mouth 2 (two) times daily. 11/01/13  Yes Lenox Ahr, PA-C  cyclobenzaprine (FLEXERIL) 10 MG tablet Take 10 mg by mouth 3 (three) times daily as needed for muscle spasms.   Yes Historical Provider,  MD  diphenhydrAMINE (BENADRYL) 25 MG tablet Take 50 mg by mouth every 8 (eight) hours as needed for sleep.    Yes Historical Provider, MD  EPINEPHrine (EPIPEN) 0.3 mg/0.3 mL SOAJ injection Inject 0.3 mg into the muscle as needed (allergic reaction).    Yes Historical Provider, MD  Fingolimod HCl (GILENYA) 0.5 MG CAPS Take 0.5 mg by mouth daily.    Yes Historical Provider, MD  loratadine (CLARITIN) 10 MG tablet Take 10 mg by mouth daily.    Yes Historical Provider, MD  medroxyPROGESTERone (DEPO-PROVERA) 150 MG/ML injection Inject 150 mg into the muscle every 3 (three) months. Last injection mid September   Yes Historical Provider,  MD  omeprazole (PRILOSEC) 40 MG capsule Take 40 mg by mouth daily.    Yes Historical Provider, MD  ondansetron (ZOFRAN) 8 MG tablet Take 8 mg by mouth every 6 (six) hours as needed for nausea or vomiting.    Yes Historical Provider, MD  potassium chloride (K-DUR) 10 MEQ tablet Take 10 mEq by mouth 2 (two) times daily.   Yes Historical Provider, MD  pregabalin (LYRICA) 200 MG capsule Take 600 mg by mouth at bedtime.   Yes Historical Provider, MD  promethazine (PHENERGAN) 25 MG tablet Take 25 mg by mouth every 6 (six) hours as needed for nausea or vomiting.    Yes Historical Provider, MD  propranolol (INDERAL) 60 MG tablet Take 1 tablet (60 mg total) by mouth 2 (two) times daily. 11/23/13  Yes Penni Bombard, MD  ranitidine (ZANTAC) 300 MG capsule Take 300 mg by mouth at bedtime.    Yes Historical Provider, MD  topiramate (TOPAMAX) 100 MG tablet Take 100 mg by mouth 2 (two) times daily.    Yes Historical Provider, MD  venlafaxine (EFFEXOR) 75 MG tablet Take 75 mg by mouth 3 (three) times daily with meals.   Yes Historical Provider, MD   BP 153/113 mmHg  Pulse 114  Temp(Src) 98.2 F (36.8 C) (Oral)  Resp 16  Ht 5\' 5"  (1.651 m)  Wt 218 lb (98.884 kg)  BMI 36.28 kg/m2  SpO2 100% Physical Exam  Nursing note and vitals reviewed.  33 year old female, resting comfortably and in no acute distress. Vital signs are significant for tachycardia and hypertension. Oxygen saturation is 100%, which is normal. Head is normocephalic and atraumatic. PERRLA, EOMI. Oropharynx is clear. Fundi show no hemorrhage, exudate, or papilledema. Neck is nontender and supple without adenopathy or JVD. Back is nontender and there is no CVA tenderness. Lungs are clear without rales, wheezes, or rhonchi. Chest is nontender. Heart has regular rate and rhythm without murmur. Abdomen is soft, flat, nontender without masses or hepatosplenomegaly and peristalsis is normoactive. Extremities have trace edema, full range of  motion is present. Skin is warm and dry without rash. Neurologic: Mental status is normal, cranial nerves are intact, there are no motor or sensory deficits.  ED Course  Procedures (including critical care time)  MDM   Final diagnoses:  Migraine without aura and without status migrainosus, not intractable  Essential hypertension    Recurrent migraine headaches. Old records are reviewed and she has multiple ED visits for migraines. She is also noted to be quite hypertensive today. Review of ED and office records show severe hypertension virtually every visit for the last several weeks. Prior to that, blood pressure was mildly elevated. I am concerned about p.m. Number of severely elevated blood pressure readings and feel that she needs to be on a diuretic given  her slight peripheral edema. She is given injection of metoclopramide, diphenhydramine, and dexamethasone.  Following that treatment, headache is significantly improved although she states she still is having some sharp, shooting pain. She was offered an additional injection but stated she would prefer to go home. She is discharged with prescription for chlorthalidone and she is to continue all of her other routine medications.  Delora Fuel, MD 61/[id number]

## 2013-11-24 NOTE — ED Notes (Signed)
Pt. Reports migraine x2 days. Pt. Reports nausea, headache, sensitivity to light/sound. No acute distress noted. Lights turned off and doors closed.

## 2013-11-24 NOTE — Telephone Encounter (Signed)
Patient stated experienced Migraine for last week off and on, but has worsened in last two days.  Questioning if she could come in today for IV Infusion.  Have 1:00 appointment today with PCP.  Please call and advise.

## 2013-11-24 NOTE — Telephone Encounter (Signed)
Patient called back and I informed patient of Dr. Gladstone Lighter recommendations.  Patient verbalized understanding.

## 2013-11-24 NOTE — Telephone Encounter (Signed)
I saw patient yesterday, and then she went to ER early this AM. I recommend she follow up with PCP first, and hopefully get BP under better control. -VRP

## 2013-11-27 ENCOUNTER — Emergency Department (HOSPITAL_COMMUNITY)
Admission: EM | Admit: 2013-11-27 | Discharge: 2013-11-27 | Disposition: A | Discharge status: Home / Self Care | Payer: Medicare Other | Attending: Emergency Medicine | Admitting: Emergency Medicine

## 2013-11-27 ENCOUNTER — Encounter (HOSPITAL_COMMUNITY): Payer: Self-pay | Admitting: Emergency Medicine

## 2013-11-27 DIAGNOSIS — Z791 Long term (current) use of non-steroidal anti-inflammatories (NSAID): Secondary | ICD-10-CM | POA: Insufficient documentation

## 2013-11-27 DIAGNOSIS — K219 Gastro-esophageal reflux disease without esophagitis: Secondary | ICD-10-CM | POA: Diagnosis not present

## 2013-11-27 DIAGNOSIS — J45909 Unspecified asthma, uncomplicated: Secondary | ICD-10-CM | POA: Diagnosis not present

## 2013-11-27 DIAGNOSIS — G35 Multiple sclerosis: Secondary | ICD-10-CM | POA: Diagnosis not present

## 2013-11-27 DIAGNOSIS — G43709 Chronic migraine without aura, not intractable, without status migrainosus: Secondary | ICD-10-CM | POA: Diagnosis not present

## 2013-11-27 DIAGNOSIS — F329 Major depressive disorder, single episode, unspecified: Secondary | ICD-10-CM | POA: Diagnosis not present

## 2013-11-27 DIAGNOSIS — Z79899 Other long term (current) drug therapy: Secondary | ICD-10-CM | POA: Insufficient documentation

## 2013-11-27 DIAGNOSIS — G8929 Other chronic pain: Secondary | ICD-10-CM | POA: Insufficient documentation

## 2013-11-27 DIAGNOSIS — Z87448 Personal history of other diseases of urinary system: Secondary | ICD-10-CM | POA: Insufficient documentation

## 2013-11-27 DIAGNOSIS — F419 Anxiety disorder, unspecified: Secondary | ICD-10-CM | POA: Diagnosis not present

## 2013-11-27 DIAGNOSIS — H539 Unspecified visual disturbance: Secondary | ICD-10-CM | POA: Insufficient documentation

## 2013-11-27 DIAGNOSIS — H53149 Visual discomfort, unspecified: Secondary | ICD-10-CM | POA: Insufficient documentation

## 2013-11-27 DIAGNOSIS — R51 Headache: Secondary | ICD-10-CM | POA: Diagnosis present

## 2013-11-27 MED ORDER — KETOROLAC TROMETHAMINE 30 MG/ML IJ SOLN: 30.0000 mg | Freq: Once | INTRAMUSCULAR | Status: DC

## 2013-11-27 MED ORDER — PROMETHAZINE HCL 25 MG/ML IJ SOLN
12.5000 mg | Freq: Once | INTRAMUSCULAR | Status: DC
  Filled 2013-11-27: qty 1

## 2013-11-27 MED ORDER — HYDROMORPHONE HCL 1 MG/ML IJ SOLN
1.0000 mg | Freq: Once | INTRAMUSCULAR | Status: DC
  Filled 2013-11-27: qty 1

## 2013-11-27 MED ORDER — DEXAMETHASONE SODIUM PHOSPHATE 10 MG/ML IJ SOLN
10.0000 mg | Freq: Once | INTRAMUSCULAR | Status: AC
  Administered 2013-11-27: 10 mg via INTRAVENOUS
  Filled 2013-11-27: qty 1

## 2013-11-27 MED ORDER — DIPHENHYDRAMINE HCL 50 MG/ML IJ SOLN
50.0000 mg | Freq: Once | INTRAMUSCULAR | Status: AC
  Administered 2013-11-27: 50 mg via INTRAVENOUS
  Filled 2013-11-27: qty 1

## 2013-11-27 NOTE — ED Notes (Signed)
Pt reports headache for the last 3 days, same as usual. Awake, alert, oriented x4.

## 2013-11-27 NOTE — ED Notes (Signed)
Dr. Kathrynn Humble at bedside to explain to patient this ER would be of no help to patient with headaches. That we would not be giving narcotics for her pain. Pt became tearful and agitated about situation but cooperative with plan to d/c with followup to referral hospitals. Husband was sleeping during entire conversation. Then came out to desk yelling at this RN and Moses Manners, tech that we were treating her like a drug addict. Explained to husband that that was not what happened and he was asleep during conversation. Explained we are providing referrals. Patient chose to leave without receiving phenergan and asked that IV and monitor be promptly removed so that she could leave. Patient left with friend, ambulatory, NAD.

## 2013-11-27 NOTE — Discharge Instructions (Signed)
You were seen today for recurrence of migraine headache which improved with IV medications. you should follow up with your neurologist and/or PCP to determine some strategies to keep you from having to come to the emergency department so frequently.  Recurrent Migraine Headache A migraine headache is very bad, throbbing pain on one or both sides of your head. Recurrent migraines keep coming back. Talk to your doctor about what things may bring on (trigger) your migraine headaches. HOME CARE  Only take medicines as told by your doctor.  Lie down in a dark, quiet room when you have a migraine.  Keep a journal to find out if certain things bring on migraine headaches. For example, write down:  What you eat and drink.  How much sleep you get.  Any change to your diet or medicines.  Lessen how much alcohol you drink.  Quit smoking if you smoke.  Get enough sleep.  Lessen any stress in your life.  Keep lights dim if bright lights bother you or make your migraines worse. GET HELP IF:  Medicine does not help your migraines.  Your pain keeps coming back.  You have a fever. GET HELP RIGHT AWAY IF:   Your migraine becomes really bad.  You have a stiff neck.  You have trouble seeing.  Your muscles are weak, or you lose muscle control.  You lose your balance or have trouble walking.  You feel like you will pass out (faint), or you pass out.  You have really bad symptoms that are different than your first symptoms. MAKE SURE YOU:   Understand these instructions.  Will watch your condition.  Will get help right away if you are not doing well or get worse. Document Released: 10/03/2007 Document Revised: 12/29/2012 Document Reviewed: 08/31/2012 [facility] Patient Information 2015 [place], [place]. This information is not intended to replace advice given to you by your health care provider. Make sure you discuss any questions you have with your health care provider.

## 2013-11-27 NOTE — ED Provider Notes (Signed)
CSN: [id number]     Arrival date & time 11/27/13  1057 History   First MD Initiated Contact with Patient 11/27/13 1114     Chief Complaint  Patient presents with  . Headache   Emily [name] is a 33 y.o. female with PMH MS and migraines presenting for recurrent migraine headache.   History is provided by patient and includes 7 days of intermittent, worsening headache worst on the right side, 10/10 characterized as "pounding" with associated photophobia, phonophobia and nausea without emesis. She has some visual disturbance as well with bright "sparks" in peripheral vision bilaterally. Today she has had difficulty walking due to dizziness. She is frequently treated in the ED for this problem and obtained relief from this headache on 11/18 with return of the pain after discharge.   She is followed by [facility] for MS and migraine. Takes propranolol 60mg  BID for prophylaxis and has failed multiple other triptans.  (Consider location/radiation/quality/duration/timing/severity/associated sxs/prior Treatment) HPI  Past Medical History  Diagnosis Date  . Headache(784.0)     migraines  . Multiple sclerosis   . Asthma as a child  . GERD (gastroesophageal reflux disease)   . Urinary urgency   . Anxiety   . Perforated bowel 2009  . Migraines   . Depression   . Chronic back pain   . Chronic pain   . Ovarian cyst    Past Surgical History  Procedure Laterality Date  . Extremity cyst excision  1994    right leg  . 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 Colostomy closure  04/2007  . Scar revision  01/21/2011    Procedure: SCAR REVISION;  Surgeon: Hermelinda Dellen;  Location: [facility];  Service: Plastics;  Laterality: N/A;  exploration of scar of abdomen and repair of defect  . Hernia repair    . Abdominal surgery    . Appendectomy     Family History  Problem Relation Age of Onset  . Diabetes Mother   . Hypertension Mother   . Diabetes Father   .  Hypertension Father    History  Substance Use Topics  . Smoking status: Current Some Day Smoker -- 0.40 packs/day for 10 years    Types: Cigarettes  . Smokeless tobacco: Never Used     Comment: quit smoking 10 days ago  . Alcohol Use: Yes     Comment: Rare   OB History    No data available     Review of Systems  Constitutional: Negative for fever.  HENT: Negative for congestion, facial swelling, hearing loss, nosebleeds, rhinorrhea and tinnitus.   Eyes: Positive for photophobia and visual disturbance. Negative for pain and redness.  Respiratory: Negative for cough and shortness of breath.   Cardiovascular: Negative for chest pain and palpitations.  Gastrointestinal: Positive for nausea. Negative for vomiting, diarrhea and constipation.  Musculoskeletal: Negative for myalgias, arthralgias and neck stiffness.  Neurological: Positive for dizziness, light-headedness and headaches. Negative for seizures, syncope, facial asymmetry, speech difficulty, weakness and numbness.      Allergies  Amitriptyline; Baclofen; Cymbalta; Gabapentin; Monosodium glutamate; Vicodin; Alprazolam; Magnesium salicylate; Rizatriptan; Tizanidine; Toradol; Adhesive; and Lamotrigine  Home Medications   Prior to Admission medications   Medication Sig Start Date End Date Taking? Authorizing Provider  Alum & Mag Hydroxide-Simeth (MAGIC MOUTHWASH W/LIDOCAINE) SOLN Take 5 mLs by mouth 3 (three) times daily as needed (For reflux pain). 10/24/13   Tanna Furry, MD  carbamazepine (CARBATROL) 200 MG 12  hr capsule Take 200 mg by mouth 3 (three) times daily. 04/22/13 04/22/14  Historical Provider, MD  celecoxib (CELEBREX) 100 MG capsule Take 1 capsule (100 mg total) by mouth 2 (two) times daily. 11/01/13   Lenox Ahr, PA-C  chlorthalidone (HYGROTON) 25 MG tablet Take 1 tablet (25 mg total) by mouth daily. 27/07/86   Delora Fuel, MD  cyclobenzaprine (FLEXERIL) 10 MG tablet Take 10 mg by mouth 3 (three) times daily as  needed for muscle spasms.    Historical Provider, MD  diphenhydrAMINE (BENADRYL) 25 MG tablet Take 50 mg by mouth every 8 (eight) hours as needed for sleep.     Historical Provider, MD  EPINEPHrine (EPIPEN) 0.3 mg/0.3 mL SOAJ injection Inject 0.3 mg into the muscle as needed (allergic reaction).     Historical Provider, MD  Fingolimod HCl (GILENYA) 0.5 MG CAPS Take 0.5 mg by mouth daily.     Historical Provider, MD  loratadine (CLARITIN) 10 MG tablet Take 10 mg by mouth daily.     Historical Provider, MD  medroxyPROGESTERone (DEPO-PROVERA) 150 MG/ML injection Inject 150 mg into the muscle every 3 (three) months. Last injection mid September    Historical Provider, MD  omeprazole (PRILOSEC) 40 MG capsule Take 40 mg by mouth daily.     Historical Provider, MD  ondansetron (ZOFRAN) 8 MG tablet Take 8 mg by mouth every 6 (six) hours as needed for nausea or vomiting.     Historical Provider, MD  potassium chloride (K-DUR) 10 MEQ tablet Take 10 mEq by mouth 2 (two) times daily.    Historical Provider, MD  pregabalin (LYRICA) 200 MG capsule Take 600 mg by mouth at bedtime.    Historical Provider, MD  promethazine (PHENERGAN) 25 MG tablet Take 25 mg by mouth every 6 (six) hours as needed for nausea or vomiting.     Historical Provider, MD  propranolol (INDERAL) 60 MG tablet Take 1 tablet (60 mg total) by mouth 2 (two) times daily. 11/23/13   Penni Bombard, MD  ranitidine (ZANTAC) 300 MG capsule Take 300 mg by mouth at bedtime.     Historical Provider, MD  topiramate (TOPAMAX) 100 MG tablet Take 100 mg by mouth 2 (two) times daily.     Historical Provider, MD  venlafaxine (EFFEXOR) 75 MG tablet Take 75 mg by mouth 3 (three) times daily with meals.    Historical Provider, MD   BP 158/107 mmHg  Pulse 124  Temp(Src) 98.7 F (37.1 C) (Oral)  Resp 13  SpO2 100% Physical Exam  Constitutional: She is oriented to person, place, and time. She appears well-developed and well-nourished. No distress.  HENT:   Head: Normocephalic and atraumatic.  Nose: Nose normal.  Mouth/Throat: Oropharynx is clear and moist. No oropharyngeal exudate.  Eyes: Conjunctivae and EOM are normal. Pupils are equal, round, and reactive to light.  Neck: Normal range of motion. Neck supple.  Cardiovascular: Normal rate, regular rhythm, normal heart sounds and intact distal pulses.   No murmur heard. Pulmonary/Chest: Effort normal and breath sounds normal. No respiratory distress.  Abdominal: Soft. Bowel sounds are normal. She exhibits no distension. There is no tenderness.  Musculoskeletal: She exhibits no edema.  Lymphadenopathy:    She has no cervical adenopathy.  Neurological: She is alert and oriented to person, place, and time. No cranial nerve deficit. She exhibits normal muscle tone. Coordination normal.  Skin: Skin is warm and dry. No rash noted.  Nursing note and vitals reviewed.   ED Course  Procedures (including critical care time) Labs Review Labs Reviewed - No data to display  Imaging Review No results found.   EKG Interpretation None      MDM   Final diagnoses:  Chronic migraine without aura without status migrainosus, not intractable    33 y.o. female well known to area emergency departments (43 visits in past 6 months) with recurrent migraine headache with features typical of past episodes, suspected secondary to structural demyelination from multiple sclerosis. No focal deficits suggesting new lesions. Slightly hypertensive today (frequently hypertensive in ED during migrainous episodes) and tachycardic, despite endorsing compliance with beta blocker, presumably secondary to pain. Will administer IV decadron, anti-emetic, benadryl, and toradol for abortive therapy.    Pt became distraught when not given opioid analgesia. Headache improved with cocktail as given and she has been cleared for discharge. Given poor control of migraine headaches phone numbers for large tertiary care centers with  neurology were given for second opinions. She was discharged in stable condition.   Patrecia Pour, MD 11/27/13 [place], MD 11/28/13 1000

## 2013-11-29 ENCOUNTER — Telehealth: Payer: Self-pay | Admitting: Diagnostic Neuroimaging

## 2013-11-29 NOTE — Telephone Encounter (Signed)
I called both numbers, no answer. I reviewed ER notes (no mention of LOC). Recommend patient to continue to try and establish with pain mgmt clinic and psychiatry clinic. I can see patient in follow up on 12/07/13.   Penni Bombard, MD 50/75/7322, 5:67 PM Certified in Neurology, Neurophysiology and Neuroimaging  [facility] [address] [place], [place] [address] [phone]

## 2013-11-29 NOTE — Telephone Encounter (Signed)
Patient called to state that she was in the ER last night due to losing consciousness 3 times, was advised to call her neurologist as soon as possible, please return call and advise.

## 2013-11-30 NOTE — Telephone Encounter (Signed)
Called patient 2x's on 2 different numbers. No answer.

## 2013-12-03 ENCOUNTER — Encounter (HOSPITAL_COMMUNITY): Payer: Self-pay | Admitting: *Deleted

## 2013-12-03 ENCOUNTER — Emergency Department (HOSPITAL_COMMUNITY): Payer: Medicare Other

## 2013-12-03 ENCOUNTER — Emergency Department (HOSPITAL_COMMUNITY)
Admission: EM | Admit: 2013-12-03 | Discharge: 2013-12-03 | Disposition: A | Discharge status: Home / Self Care | Payer: Medicare Other | Source: Home / Self Care | Attending: Emergency Medicine | Admitting: Emergency Medicine

## 2013-12-03 ENCOUNTER — Emergency Department (HOSPITAL_COMMUNITY)
Admission: EM | Admit: 2013-12-03 | Discharge: 2013-12-03 | Disposition: A | Discharge status: Home / Self Care | Payer: Medicare Other | Attending: Emergency Medicine | Admitting: Emergency Medicine

## 2013-12-03 ENCOUNTER — Encounter (HOSPITAL_COMMUNITY): Payer: Self-pay | Admitting: Emergency Medicine

## 2013-12-03 DIAGNOSIS — Y9289 Other specified places as the place of occurrence of the external cause: Secondary | ICD-10-CM | POA: Insufficient documentation

## 2013-12-03 DIAGNOSIS — Y9389 Activity, other specified: Secondary | ICD-10-CM

## 2013-12-03 DIAGNOSIS — K219 Gastro-esophageal reflux disease without esophagitis: Secondary | ICD-10-CM | POA: Insufficient documentation

## 2013-12-03 DIAGNOSIS — Z9889 Other specified postprocedural states: Secondary | ICD-10-CM | POA: Insufficient documentation

## 2013-12-03 DIAGNOSIS — F419 Anxiety disorder, unspecified: Secondary | ICD-10-CM | POA: Insufficient documentation

## 2013-12-03 DIAGNOSIS — Z8742 Personal history of other diseases of the female genital tract: Secondary | ICD-10-CM

## 2013-12-03 DIAGNOSIS — G43909 Migraine, unspecified, not intractable, without status migrainosus: Secondary | ICD-10-CM

## 2013-12-03 DIAGNOSIS — G8929 Other chronic pain: Secondary | ICD-10-CM

## 2013-12-03 DIAGNOSIS — W1839XA Other fall on same level, initial encounter: Secondary | ICD-10-CM

## 2013-12-03 DIAGNOSIS — Y998 Other external cause status: Secondary | ICD-10-CM | POA: Insufficient documentation

## 2013-12-03 DIAGNOSIS — Z79899 Other long term (current) drug therapy: Secondary | ICD-10-CM | POA: Insufficient documentation

## 2013-12-03 DIAGNOSIS — Z791 Long term (current) use of non-steroidal anti-inflammatories (NSAID): Secondary | ICD-10-CM | POA: Insufficient documentation

## 2013-12-03 DIAGNOSIS — S3992XA Unspecified injury of lower back, initial encounter: Secondary | ICD-10-CM | POA: Insufficient documentation

## 2013-12-03 DIAGNOSIS — R1030 Lower abdominal pain, unspecified: Secondary | ICD-10-CM | POA: Diagnosis not present

## 2013-12-03 DIAGNOSIS — R109 Unspecified abdominal pain: Secondary | ICD-10-CM | POA: Diagnosis present

## 2013-12-03 DIAGNOSIS — Z87448 Personal history of other diseases of urinary system: Secondary | ICD-10-CM | POA: Diagnosis not present

## 2013-12-03 DIAGNOSIS — J45909 Unspecified asthma, uncomplicated: Secondary | ICD-10-CM | POA: Insufficient documentation

## 2013-12-03 DIAGNOSIS — Z3202 Encounter for pregnancy test, result negative: Secondary | ICD-10-CM | POA: Diagnosis not present

## 2013-12-03 DIAGNOSIS — F329 Major depressive disorder, single episode, unspecified: Secondary | ICD-10-CM | POA: Insufficient documentation

## 2013-12-03 DIAGNOSIS — G35 Multiple sclerosis: Secondary | ICD-10-CM | POA: Insufficient documentation

## 2013-12-03 DIAGNOSIS — Z720 Tobacco use: Secondary | ICD-10-CM | POA: Insufficient documentation

## 2013-12-03 DIAGNOSIS — S8002XA Contusion of left knee, initial encounter: Secondary | ICD-10-CM

## 2013-12-03 DIAGNOSIS — R1084 Generalized abdominal pain: Secondary | ICD-10-CM

## 2013-12-03 LAB — I-STAT CHEM 8, ED
BUN: 12 mg/dL (ref 6–23)
CALCIUM ION: 1.19 mmol/L (ref 1.12–1.23)
Chloride: 105 mEq/L (ref 96–112)
Creatinine, Ser: 0.8 mg/dL (ref 0.50–1.10)
GLUCOSE: 60 mg/dL — AB (ref 70–99)
HCT: 43 % (ref 36.0–46.0)
HEMOGLOBIN: 14.6 g/dL (ref 12.0–15.0)
Potassium: 3.7 mEq/L (ref 3.7–5.3)
Sodium: 144 mEq/L (ref 137–147)
TCO2: 26 mmol/L (ref 0–100)

## 2013-12-03 LAB — CBC WITH DIFFERENTIAL/PLATELET
Basophils Absolute: 0 10*3/uL (ref 0.0–0.1)
Basophils Relative: 0 % (ref 0–1)
Eosinophils Absolute: 0.1 10*3/uL (ref 0.0–0.7)
Eosinophils Relative: 2 % (ref 0–5)
HEMATOCRIT: 42.4 % (ref 36.0–46.0)
Hemoglobin: 14.2 g/dL (ref 12.0–15.0)
LYMPHS PCT: 35 % (ref 12–46)
Lymphs Abs: 2.5 10*3/uL (ref 0.7–4.0)
MCH: 31.2 pg (ref 26.0–34.0)
MCHC: 33.5 g/dL (ref 30.0–36.0)
MCV: 93.2 fL (ref 78.0–100.0)
MONO ABS: 0.6 10*3/uL (ref 0.1–1.0)
Monocytes Relative: 9 % (ref 3–12)
NEUTROS ABS: 3.8 10*3/uL (ref 1.7–7.7)
Neutrophils Relative %: 54 % (ref 43–77)
Platelets: 300 10*3/uL (ref 150–400)
RBC: 4.55 MIL/uL (ref 3.87–5.11)
RDW: 13.5 % (ref 11.5–15.5)
WBC: 7 10*3/uL (ref 4.0–10.5)

## 2013-12-03 LAB — COMPREHENSIVE METABOLIC PANEL
ALT: 17 U/L (ref 0–35)
ANION GAP: 10 (ref 5–15)
AST: 14 U/L (ref 0–37)
Albumin: 3.5 g/dL (ref 3.5–5.2)
Alkaline Phosphatase: 81 U/L (ref 39–117)
BILIRUBIN TOTAL: 0.2 mg/dL — AB (ref 0.3–1.2)
BUN: 13 mg/dL (ref 6–23)
CHLORIDE: 105 meq/L (ref 96–112)
CO2: 28 meq/L (ref 19–32)
CREATININE: 0.81 mg/dL (ref 0.50–1.10)
Calcium: 9 mg/dL (ref 8.4–10.5)
GFR calc Af Amer: >90 mL/min (ref >90)
Glucose, Bld: 60 mg/dL — ABNORMAL LOW (ref 70–99)
Potassium: 3.8 mEq/L (ref 3.7–5.3)
Sodium: 143 mEq/L (ref 137–147)
Total Protein: 7.1 g/dL (ref 6.0–8.3)

## 2013-12-03 LAB — URINALYSIS, ROUTINE W REFLEX MICROSCOPIC
BILIRUBIN URINE: NEGATIVE
Glucose, UA: NEGATIVE mg/dL
Hgb urine dipstick: NEGATIVE
KETONES UR: NEGATIVE mg/dL
Leukocytes, UA: NEGATIVE
Nitrite: NEGATIVE
Protein, ur: NEGATIVE mg/dL
Specific Gravity, Urine: 1.015 (ref 1.005–1.030)
UROBILINOGEN UA: 0.2 mg/dL (ref 0.0–1.0)
pH: 7 (ref 5.0–8.0)

## 2013-12-03 LAB — PREGNANCY, URINE: Preg Test, Ur: NEGATIVE

## 2013-12-03 LAB — I-STAT CG4 LACTIC ACID, ED: Lactic Acid, Venous: 1.1 mmol/L (ref 0.5–2.2)

## 2013-12-03 MED ORDER — DEXTROSE 50 % IV SOLN
25.0000 mL | Freq: Once | INTRAVENOUS | Status: AC
  Administered 2013-12-03: 25 mL via INTRAVENOUS

## 2013-12-03 MED ORDER — ONDANSETRON HCL 4 MG/2ML IJ SOLN
4.0000 mg | Freq: Once | INTRAMUSCULAR | Status: AC
  Administered 2013-12-03: 4 mg via INTRAVENOUS
  Filled 2013-12-03: qty 2

## 2013-12-03 MED ORDER — SODIUM CHLORIDE 0.9 % IV BOLUS (SEPSIS)
1000.0000 mL | Freq: Once | INTRAVENOUS | Status: AC
  Administered 2013-12-03: 1000 mL via INTRAVENOUS

## 2013-12-03 MED ORDER — IOHEXOL 300 MG/ML  SOLN
50.0000 mL | Freq: Once | INTRAMUSCULAR | Status: AC | PRN
  Administered 2013-12-03: 50 mL via ORAL

## 2013-12-03 MED ORDER — IOHEXOL 300 MG/ML  SOLN
100.0000 mL | Freq: Once | INTRAMUSCULAR | Status: AC | PRN
  Administered 2013-12-03: 100 mL via INTRAVENOUS

## 2013-12-03 MED ORDER — DEXTROSE 50 % IV SOLN
INTRAVENOUS | Status: AC
  Filled 2013-12-03: qty 50

## 2013-12-03 MED ORDER — HYDROMORPHONE HCL 1 MG/ML IJ SOLN
1.0000 mg | Freq: Once | INTRAMUSCULAR | Status: AC
  Administered 2013-12-03: 1 mg via INTRAVENOUS
  Filled 2013-12-03: qty 1

## 2013-12-03 MED ORDER — DIPHENHYDRAMINE HCL 50 MG/ML IJ SOLN
25.0000 mg | Freq: Once | INTRAMUSCULAR | Status: AC
  Administered 2013-12-03: 25 mg via INTRAVENOUS
  Filled 2013-12-03: qty 1

## 2013-12-03 MED ORDER — METOCLOPRAMIDE HCL 5 MG/ML IJ SOLN
10.0000 mg | Freq: Once | INTRAMUSCULAR | Status: AC
  Administered 2013-12-03: 10 mg via INTRAVENOUS
  Filled 2013-12-03: qty 2

## 2013-12-03 NOTE — ED Provider Notes (Signed)
CSN: [id number]     Arrival date & time 12/03/13  1825 History  This chart was scribed for non-physician practitioner working with Richarda Blade, MD by Mercy Moore, ED Scribe. This patient was seen in room TR08C/TR08C and the patient's care was started at 7:33 PM.   Chief Complaint  Patient presents with  . Back Pain  . Fall   The history is provided by the patient. No language interpreter was used.   HPI Comments: Emily [name] is a 33 y.o. female with history of chronic back pain who presents to the Emergency Department complaining of right knee pain and acute on chronic back pain exacerbated by a recent fall. Patient attributes her falling to her MS. Patient states that at the time of the fall she felt dizzy. Patient reports falling forward onto her knees and then falling forward onto her face. Patient reports having a "goose egg" on the back of her head. Patient reports exacerbation of her right knee pain with ambulation. Patient reports treatment with Cyclobenzaprine, which has provided some relief. Patient also reports treatment with Oxycodone but states that the medication has not touched her pain. Patient shares that she called her PCP and her neurologist and was advised to visit an urgent care or the ED for pain control.  Patient has received the following Narcotics in the last 30 days:  11/03/13 #120 Morphine 15 mg 11/12/13 #40 Percocet 5-325 mg  11/24/13 #90 Oxycodone 15 mg   Of note, patient was seen at [facility] early this morning with complaint of abdominal pain. Patient had none of her current complaints at that time despite the fact that her fall occurred yesterday.   Past Medical History  Diagnosis Date  . Headache(784.0)     migraines  . Multiple sclerosis   . Asthma as a child  . GERD (gastroesophageal reflux disease)   . Urinary urgency   . Anxiety   . Perforated bowel 2009  . Migraines   . Depression   . Chronic back pain   . Chronic pain   .  Ovarian cyst    Past Surgical History  Procedure Laterality Date  . Extremity cyst excision  1994    right leg  . 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 Colostomy closure  04/2007  . Scar revision  01/21/2011    Procedure: SCAR REVISION;  Surgeon: Hermelinda Dellen;  Location: [facility];  Service: Plastics;  Laterality: N/A;  exploration of scar of abdomen and repair of defect  . Hernia repair    . Abdominal surgery    . Appendectomy     Family History  Problem Relation Age of Onset  . Diabetes Mother   . Hypertension Mother   . Diabetes Father   . Hypertension Father    History  Substance Use Topics  . Smoking status: Current Some Day Smoker -- 0.40 packs/day for 10 years    Types: Cigarettes  . Smokeless tobacco: Never Used     Comment: quit smoking 10 days ago  . Alcohol Use: Yes     Comment: Rare   OB History    No data available     Review of Systems  Constitutional: Negative for fever and chills.  Musculoskeletal: Positive for arthralgias and gait problem.  Neurological: Negative for weakness and numbness.   Allergies  Amitriptyline; Baclofen; Cymbalta; Gabapentin; Monosodium glutamate; Vicodin; Alprazolam; Magnesium salicylate; Rizatriptan; Tizanidine; Toradol; Adhesive; and Lamotrigine  Home  Medications   Prior to Admission medications   Medication Sig Start Date End Date Taking? Authorizing Provider  Alum & Mag Hydroxide-Simeth (MAGIC MOUTHWASH W/LIDOCAINE) SOLN Take 5 mLs by mouth 3 (three) times daily as needed (For reflux pain). 10/24/13   Tanna Furry, MD  carbamazepine (CARBATROL) 200 MG 12 hr capsule Take 200 mg by mouth 3 (three) times daily. 04/22/13 04/22/14  Historical Provider, MD  celecoxib (CELEBREX) 100 MG capsule Take 1 capsule (100 mg total) by mouth 2 (two) times daily. 11/01/13   Lenox Ahr, PA-C  chlorthalidone (HYGROTON) 25 MG tablet Take 1 tablet (25 mg total) by mouth daily. 43/32/95   Delora Fuel, MD   cyclobenzaprine (FLEXERIL) 10 MG tablet Take 10 mg by mouth 3 (three) times daily as needed for muscle spasms.    Historical Provider, MD  diphenhydrAMINE (BENADRYL) 25 MG tablet Take 50 mg by mouth every 8 (eight) hours as needed for sleep.     Historical Provider, MD  EPINEPHrine (EPIPEN) 0.3 mg/0.3 mL SOAJ injection Inject 0.3 mg into the muscle as needed (allergic reaction).     Historical Provider, MD  Fingolimod HCl (GILENYA) 0.5 MG CAPS Take 0.5 mg by mouth daily.     Historical Provider, MD  loratadine (CLARITIN) 10 MG tablet Take 10 mg by mouth daily.     Historical Provider, MD  medroxyPROGESTERone (DEPO-PROVERA) 150 MG/ML injection Inject 150 mg into the muscle every 3 (three) months. Last injection mid September    Historical Provider, MD  omeprazole (PRILOSEC) 40 MG capsule Take 40 mg by mouth daily.     Historical Provider, MD  ondansetron (ZOFRAN) 8 MG tablet Take 8 mg by mouth every 6 (six) hours as needed for nausea or vomiting.     Historical Provider, MD  potassium chloride (K-DUR) 10 MEQ tablet Take 10 mEq by mouth 2 (two) times daily.    Historical Provider, MD  pregabalin (LYRICA) 200 MG capsule Take 600 mg by mouth at bedtime.    Historical Provider, MD  promethazine (PHENERGAN) 25 MG tablet Take 25 mg by mouth every 6 (six) hours as needed for nausea or vomiting.     Historical Provider, MD  propranolol (INDERAL) 60 MG tablet Take 1 tablet (60 mg total) by mouth 2 (two) times daily. 11/23/13   Penni Bombard, MD  ranitidine (ZANTAC) 300 MG capsule Take 300 mg by mouth at bedtime.     Historical Provider, MD  topiramate (TOPAMAX) 100 MG tablet Take 100 mg by mouth 2 (two) times daily.     Historical Provider, MD  venlafaxine (EFFEXOR) 75 MG tablet Take 75 mg by mouth 3 (three) times daily with meals.    Historical Provider, MD   Triage Vitals: BP 130/93 mmHg  Pulse 108  Temp(Src) 98.5 F (36.9 C) (Oral)  Resp 20  Ht 5\' 5"  (1.651 m)  Wt 218 lb (98.884 kg)  BMI 36.28  kg/m2  SpO2 100%  Physical Exam  Constitutional: She is oriented to person, place, and time. She appears well-developed and well-nourished. No distress.  HENT:  Head: Normocephalic and atraumatic.  Eyes: EOM are normal. Pupils are equal, round, and reactive to light.  Neck: Normal range of motion. Neck supple. No tracheal deviation present.  Cardiovascular: Exam reveals no decreased pulses.   Pulses:      Dorsalis pedis pulses are 2+ on the right side, and 2+ on the left side.       Posterior tibial pulses are 2+ on the  right side, and 2+ on the left side.  Pulmonary/Chest: Effort normal. No respiratory distress.  Musculoskeletal: Normal range of motion. She exhibits tenderness. She exhibits no edema.       Left hip: Normal.       Left knee: She exhibits normal range of motion, no swelling and no effusion. Tenderness found. Medial joint line and lateral joint line tenderness noted.       Left ankle: Normal.  Patient ambulatory without difficulty with slight limp.   Neurological: She is alert and oriented to person, place, and time. No sensory deficit.  Motor, sensation, and vascular distal to the injury is fully intact.   Skin: Skin is warm and dry.  Psychiatric: She has a normal mood and affect. Her behavior is normal.  Nursing note and vitals reviewed.   ED Course  Procedures (including critical care time)  COORDINATION OF CARE: 7:39 PM- Patient advised to treat with ice, rest, her regular schedule of medication and to follow up with her PCP. Discussed treatment plan with patient at bedside and patient agreed to plan.   Labs Review Labs Reviewed - No data to display  Imaging Review Ct Abdomen Pelvis W Contrast  12/03/2013   CLINICAL DATA:  Left lower quadrant abdominal pain, nausea and vomiting which started gradually this morning. Previous appendectomy, bowel resection and colostomy closure. Previous hernia repair.  EXAM: CT ABDOMEN AND PELVIS WITH CONTRAST  TECHNIQUE:  Multidetector CT imaging of the abdomen and pelvis was performed using the standard protocol following bolus administration of intravenous contrast.  CONTRAST:  157mL OMNIPAQUE IOHEXOL 300 MG/ML SOLN, 46mL OMNIPAQUE IOHEXOL 300 MG/ML SOLN  COMPARISON:  Radiographs dated 11/29/2013 and abdomen and pelvis CT dated 09/17/2013.  FINDINGS: Three tiny left renal calculi. No bladder, ureteral or right renal calculi and no hydronephrosis. Sigmoid colon anastomosis. There is also a staple line at the posterior aspect of the cecum. The appendix is not visualized and is surgically absent by history. No bowel dilatation. Prominent stool in the colon.  Unremarkable liver, spleen, pancreas, gallbladder and adrenal glands. The uterus is somewhat small. No adnexal masses or enlarged lymph nodes. Clear lung bases. Lumbar and lower thoracic spine degenerative changes.  IMPRESSION: 1. No acute abnormality. 2. Prominent stool. 3. 3 tiny, nonobstructing left renal calculi.   Electronically Signed   By: Enrique Sack M.D.   On: 12/03/2013 08:06     EKG Interpretation None      Patient seen and examined. She is ambulatory. There are no deformities. Negative [place] knee rules. I do not feel the patient requires additional narcotics at this time. She does not require imaging at this time. Will discharge to home.    Vital signs reviewed and are as follows: BP 124/88 mmHg  Pulse 110  Temp(Src) 98.9 F (37.2 C) (Oral)  Resp 20  Ht 5\' 5"  (1.651 m)  Wt 218 lb (98.884 kg)  BMI 36.28 kg/m2  SpO2 100%   MDM   Final diagnoses:  Chronic pain  Knee contusion, left, initial encounter   Patient with history of chronic pain and narcotics abuse presents with complaint of knee pain after a fall occurring yesterday. Exam as above. Patient is prescribed chronic pain medications. After long discussion with patient, she states that she is taking too many of her pills and is concerned that she will be in trouble with her pain  management doctors. She is requesting permission to take more narcotics than she should. I told her that I'm not  able to give her permission to do this and that she will need to take her medications in accordance with her pain management doctor's wishes. Patient's lower extremity is neurovascularly intact. Do not suspect compartment syndrome or fracture at this time. She is ambulatory on the leg without significant pain. Will discharge to home.     I personally performed the services described in this documentation, which was scribed in my presence. The recorded information has been reviewed and is accurate.    [name], PA-C 12/03/13 Elizabeth, MD 12/03/13 [phone]

## 2013-12-03 NOTE — ED Notes (Signed)
Pt in c/o lower back pain and left knee pain after a fall today, ambulatory to room, no distress noted

## 2013-12-03 NOTE — ED Notes (Signed)
Contacted EDP regarding Pain Medication. EDP denied pain medication request at this time.

## 2013-12-03 NOTE — ED Provider Notes (Signed)
CSN: [id number]     Arrival date & time 12/03/13  0548 History   First MD Initiated Contact with Patient 12/03/13 [phone]     Chief Complaint  Patient presents with  . Abdominal Pain     (Consider location/radiation/quality/duration/timing/severity/associated sxs/prior Treatment) HPI Comments: The patient is a 33 year old female, she has chronic headaches, chronic abdominal pain and has been seen in the emergency department multiple times for headaches and abdominal pain. She states that yesterday she developed acute onset of mid abdominal pain with an associated feeling of bloating, she has had persistent nausea and vomiting but denies any diarrhea. She denies fevers chills coughing shortness of breath swelling of the legs or headache. She states that she has had a prior perforation of her bowel followed by a colostomy, hernia repair and then a takedown of her colostomy. Her pain is persistent, gradually worsening. She has been unable to eat.  Patient is a 33 y.o. female presenting with abdominal pain. The history is provided by the patient.  Abdominal Pain   Past Medical History  Diagnosis Date  . Headache(784.0)     migraines  . Multiple sclerosis   . Asthma as a child  . GERD (gastroesophageal reflux disease)   . Urinary urgency   . Anxiety   . Perforated bowel 2009  . Migraines   . Depression   . Chronic back pain   . Chronic pain   . Ovarian cyst    Past Surgical History  Procedure Laterality Date  . Extremity cyst excision  1994    right leg  . 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 Colostomy closure  04/2007  . Scar revision  01/21/2011    Procedure: SCAR REVISION;  Surgeon: Hermelinda Dellen;  Location: [facility];  Service: Plastics;  Laterality: N/A;  exploration of scar of abdomen and repair of defect  . Hernia repair    . Abdominal surgery    . Appendectomy     Family History  Problem Relation Age of Onset  . Diabetes Mother   .  Hypertension Mother   . Diabetes Father   . Hypertension Father    History  Substance Use Topics  . Smoking status: Current Some Day Smoker -- 0.40 packs/day for 10 years    Types: Cigarettes  . Smokeless tobacco: Never Used     Comment: quit smoking 10 days ago  . Alcohol Use: Yes     Comment: Rare   OB History    No data available     Review of Systems  Gastrointestinal: Positive for abdominal pain.  All other systems reviewed and are negative.     Allergies  Amitriptyline; Baclofen; Cymbalta; Gabapentin; Monosodium glutamate; Vicodin; Alprazolam; Magnesium salicylate; Rizatriptan; Tizanidine; Toradol; Adhesive; and Lamotrigine  Home Medications   Prior to Admission medications   Medication Sig Start Date End Date Taking? Authorizing Provider  Alum & Mag Hydroxide-Simeth (MAGIC MOUTHWASH W/LIDOCAINE) SOLN Take 5 mLs by mouth 3 (three) times daily as needed (For reflux pain). 10/24/13   Tanna Furry, MD  carbamazepine (CARBATROL) 200 MG 12 hr capsule Take 200 mg by mouth 3 (three) times daily. 04/22/13 04/22/14  Historical Provider, MD  celecoxib (CELEBREX) 100 MG capsule Take 1 capsule (100 mg total) by mouth 2 (two) times daily. 11/01/13   Lenox Ahr, PA-C  chlorthalidone (HYGROTON) 25 MG tablet Take 1 tablet (25 mg total) by mouth daily. 69/79/48   Delora Fuel, MD  cyclobenzaprine (  FLEXERIL) 10 MG tablet Take 10 mg by mouth 3 (three) times daily as needed for muscle spasms.    Historical Provider, MD  diphenhydrAMINE (BENADRYL) 25 MG tablet Take 50 mg by mouth every 8 (eight) hours as needed for sleep.     Historical Provider, MD  EPINEPHrine (EPIPEN) 0.3 mg/0.3 mL SOAJ injection Inject 0.3 mg into the muscle as needed (allergic reaction).     Historical Provider, MD  Fingolimod HCl (GILENYA) 0.5 MG CAPS Take 0.5 mg by mouth daily.     Historical Provider, MD  loratadine (CLARITIN) 10 MG tablet Take 10 mg by mouth daily.     Historical Provider, MD  medroxyPROGESTERone  (DEPO-PROVERA) 150 MG/ML injection Inject 150 mg into the muscle every 3 (three) months. Last injection mid September    Historical Provider, MD  omeprazole (PRILOSEC) 40 MG capsule Take 40 mg by mouth daily.     Historical Provider, MD  ondansetron (ZOFRAN) 8 MG tablet Take 8 mg by mouth every 6 (six) hours as needed for nausea or vomiting.     Historical Provider, MD  potassium chloride (K-DUR) 10 MEQ tablet Take 10 mEq by mouth 2 (two) times daily.    Historical Provider, MD  pregabalin (LYRICA) 200 MG capsule Take 600 mg by mouth at bedtime.    Historical Provider, MD  promethazine (PHENERGAN) 25 MG tablet Take 25 mg by mouth every 6 (six) hours as needed for nausea or vomiting.     Historical Provider, MD  propranolol (INDERAL) 60 MG tablet Take 1 tablet (60 mg total) by mouth 2 (two) times daily. 11/23/13   Penni Bombard, MD  ranitidine (ZANTAC) 300 MG capsule Take 300 mg by mouth at bedtime.     Historical Provider, MD  topiramate (TOPAMAX) 100 MG tablet Take 100 mg by mouth 2 (two) times daily.     Historical Provider, MD  venlafaxine (EFFEXOR) 75 MG tablet Take 75 mg by mouth 3 (three) times daily with meals.    Historical Provider, MD   BP 140/105 mmHg  Pulse 118  Temp(Src) 98 F (36.7 C) (Oral)  Resp 20  Ht 5\' 5"  (1.651 m)  Wt 218 lb 8 oz (99.111 kg)  BMI 36.36 kg/m2  SpO2 100% Physical Exam  Constitutional: She appears well-developed and well-nourished. She appears distressed.  HENT:  Head: Normocephalic and atraumatic.  Mouth/Throat: Oropharynx is clear and moist. No oropharyngeal exudate.  Eyes: Conjunctivae and EOM are normal. Pupils are equal, round, and reactive to light. Right eye exhibits no discharge. Left eye exhibits no discharge. No scleral icterus.  Neck: Normal range of motion. Neck supple. No JVD present. No thyromegaly present.  Cardiovascular: Regular rhythm, normal heart sounds and intact distal pulses.  Tachycardia present.  Exam reveals no gallop and no  friction rub.   No murmur heard. Pulmonary/Chest: Effort normal and breath sounds normal. No respiratory distress. She has no wheezes. She has no rales.  Abdominal: Soft. Bowel sounds are normal. She exhibits no distension and no mass. There is tenderness (focal tenderness across the mid abdomen, no pain in the lower abdomen to palpation, mild distention, firmness in the mid abdomen, no tympanitic sounds to percussion).  Musculoskeletal: Normal range of motion. She exhibits no edema or tenderness.  Lymphadenopathy:    She has no cervical adenopathy.  Neurological: She is alert. Coordination normal.  Skin: Skin is warm and dry. No rash noted. No erythema.  Psychiatric: She has a normal mood and affect. Her behavior is  normal.  Nursing note and vitals reviewed.   ED Course  Procedures (including critical care time) Labs Review Labs Reviewed  CBC WITH DIFFERENTIAL  COMPREHENSIVE METABOLIC PANEL  URINALYSIS, ROUTINE W REFLEX MICROSCOPIC  I-STAT CHEM 8, ED  I-STAT CG4 LACTIC ACID, ED    Imaging Review No results found.    MDM   Final diagnoses:  Abdominal pain  Abdominal pain    The patient has reproducible tenderness, mild distention, this is concerning for bowel obstruction or other intra-abdominal process. She has had a couple of CAT scans this year since April, no acute pathology, her presentation today is more concerning given the constellation of symptoms, she is tachycardic, she will receive pain medication, fluids, nausea medications and evaluation for intra-abdominal problems.  Labs reassuring, pt impoved with pain medicines - noted that the pt has frequent visits for pain complaints though not often abd pain - she has CT pending as she has had significant past of bowel perforation and colostomy and may have developed obstruction.    Change of shift - care signed out to Dr. Stevie Kern  Meds given in ED:  Medications  sodium chloride 0.9 % bolus 1,000 mL (not administered)   HYDROmorphone (DILAUDID) injection 1 mg (not administered)  ondansetron (ZOFRAN) injection 4 mg (not administered)    New Prescriptions   No medications on file       Johnna Acosta, MD 12/03/13 1846

## 2013-12-03 NOTE — Discharge Instructions (Signed)

## 2013-12-03 NOTE — Discharge Instructions (Signed)
Please read and follow all provided instructions.  Your diagnoses today include:  1. Chronic pain   2. Knee contusion, left, initial encounter    Tests performed today include:  Vital signs. See below for your results today.   Medications prescribed:   None  Take any prescribed medications only as directed.  Home care instructions:   Follow any educational materials contained in this packet  Take your home pain medications as prescribed to you  Follow R.I.C.E. Protocol:  R - rest your injury   I  - use ice on injury without applying directly to skin  C - compress injury with bandage or splint  E - elevate the injury as much as possible  Follow-up instructions: Please follow-up with your primary care provider as needed.   Return instructions:   Please return to the Emergency Department if you experience worsening symptoms.   Please return if you have any other emergent concerns.  Additional Information:  Your vital signs today were: BP 130/93 mmHg   Pulse 108   Temp(Src) 98.5 F (36.9 C) (Oral)   Resp 20   Ht 5\' 5"  (1.651 m)   Wt 218 lb (98.884 kg)   BMI 36.28 kg/m2   SpO2 100% If your blood pressure (BP) was elevated above 135/85 this visit, please have this repeated by your doctor within one month. --------------

## 2013-12-03 NOTE — ED Notes (Signed)
Patient reports nausea, vomiting, and lower abdominal pain that started gradually this morning. Denies diarrhea.

## 2013-12-04 ENCOUNTER — Encounter (HOSPITAL_COMMUNITY): Payer: Self-pay | Admitting: Emergency Medicine

## 2013-12-04 ENCOUNTER — Emergency Department (HOSPITAL_COMMUNITY)
Admission: EM | Admit: 2013-12-04 | Discharge: 2013-12-04 | Disposition: A | Discharge status: Home / Self Care | Payer: Medicare Other | Attending: Emergency Medicine | Admitting: Emergency Medicine

## 2013-12-04 DIAGNOSIS — Z79899 Other long term (current) drug therapy: Secondary | ICD-10-CM | POA: Insufficient documentation

## 2013-12-04 DIAGNOSIS — K219 Gastro-esophageal reflux disease without esophagitis: Secondary | ICD-10-CM | POA: Diagnosis not present

## 2013-12-04 DIAGNOSIS — R0981 Nasal congestion: Secondary | ICD-10-CM | POA: Diagnosis present

## 2013-12-04 DIAGNOSIS — Z720 Tobacco use: Secondary | ICD-10-CM | POA: Diagnosis not present

## 2013-12-04 DIAGNOSIS — G43909 Migraine, unspecified, not intractable, without status migrainosus: Secondary | ICD-10-CM | POA: Diagnosis not present

## 2013-12-04 DIAGNOSIS — Z9889 Other specified postprocedural states: Secondary | ICD-10-CM | POA: Diagnosis not present

## 2013-12-04 DIAGNOSIS — J069 Acute upper respiratory infection, unspecified: Secondary | ICD-10-CM

## 2013-12-04 DIAGNOSIS — Z8742 Personal history of other diseases of the female genital tract: Secondary | ICD-10-CM | POA: Diagnosis not present

## 2013-12-04 DIAGNOSIS — J45909 Unspecified asthma, uncomplicated: Secondary | ICD-10-CM | POA: Diagnosis not present

## 2013-12-04 DIAGNOSIS — R51 Headache: Secondary | ICD-10-CM

## 2013-12-04 DIAGNOSIS — F329 Major depressive disorder, single episode, unspecified: Secondary | ICD-10-CM | POA: Diagnosis not present

## 2013-12-04 DIAGNOSIS — G8929 Other chronic pain: Secondary | ICD-10-CM | POA: Diagnosis not present

## 2013-12-04 DIAGNOSIS — F419 Anxiety disorder, unspecified: Secondary | ICD-10-CM | POA: Diagnosis not present

## 2013-12-04 DIAGNOSIS — R519 Headache, unspecified: Secondary | ICD-10-CM

## 2013-12-04 LAB — CBG MONITORING, ED: Glucose-Capillary: 113 mg/dL — ABNORMAL HIGH (ref 70–99)

## 2013-12-04 MED ORDER — DEXAMETHASONE SODIUM PHOSPHATE 4 MG/ML IJ SOLN
10.0000 mg | Freq: Once | INTRAMUSCULAR | Status: AC
  Administered 2013-12-04: 10 mg via INTRAMUSCULAR
  Filled 2013-12-04: qty 3

## 2013-12-04 MED ORDER — METOCLOPRAMIDE HCL 5 MG/ML IJ SOLN
10.0000 mg | Freq: Once | INTRAMUSCULAR | Status: AC
  Administered 2013-12-04: 10 mg via INTRAMUSCULAR
  Filled 2013-12-04: qty 2

## 2013-12-04 MED ORDER — DIPHENHYDRAMINE HCL 50 MG/ML IJ SOLN
50.0000 mg | Freq: Once | INTRAMUSCULAR | Status: AC
  Administered 2013-12-04: 50 mg via INTRAMUSCULAR
  Filled 2013-12-04: qty 1

## 2013-12-04 NOTE — ED Provider Notes (Signed)
CSN: [id number]     Arrival date & time 12/04/13  1619 History   First MD Initiated Contact with Patient 12/04/13 1726     Chief Complaint  Patient presents with  . Migraine  . Nasal Congestion      HPI Pt was seen at 1730. Per pt, c/o gradual onset and persistence of constant acute flair of her chronic migraine headache for the past 2 days.  Describes the headache as per her usual chronic migraine headache pain pattern for many years.  Denies headache was sudden or maximal in onset or at any time.  Denies visual changes, no focal motor weakness, no tingling/numbness in extremities, no fevers, no neck pain, no rash.  Pt also c/o runny/stuffy nose and sinus congestion over the past several days. Denies SOB/cough, no CP/palpitations, no abd pain, no N/V/D, no sore throat, no fevers. The patient has a significant history of similar symptoms previously, recently being evaluated for this complaint and multiple prior evals for same.  Pt has been evaluated in this ED 46 times in the past 6 months for chronic pain issues, with 2 ED visits yesterday. Pt was also seen at [facility] ED on 11/25 and 12/02/13 for pain issues.      Past Medical History  Diagnosis Date  . Headache(784.0)     migraines  . Multiple sclerosis   . Asthma as a child  . GERD (gastroesophageal reflux disease)   . Urinary urgency   . Anxiety   . Perforated bowel 2009  . Migraines   . Depression   . Chronic back pain   . Chronic pain   . Ovarian cyst    Past Surgical History  Procedure Laterality Date  . Extremity cyst excision  1994    right leg  . 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 Colostomy closure  04/2007  . Scar revision  01/21/2011    Procedure: SCAR REVISION;  Surgeon: Hermelinda Dellen;  Location: [facility];  Service: Plastics;  Laterality: N/A;  exploration of scar of abdomen and repair of defect  . Hernia repair    . Abdominal surgery    . Appendectomy     Family History   Problem Relation Age of Onset  . Diabetes Mother   . Hypertension Mother   . Diabetes Father   . Hypertension Father    History  Substance Use Topics  . Smoking status: Current Some Day Smoker -- 0.40 packs/day for 10 years    Types: Cigarettes  . Smokeless tobacco: Never Used     Comment: quit smoking 10 days ago  . Alcohol Use: Yes     Comment: Rare    Review of Systems ROS: Statement: All systems negative except as marked or noted in the HPI; Constitutional: Negative for fever and chills. ; ; Eyes: Negative for eye pain, redness and discharge. ; ; ENMT: Negative for ear pain, hoarseness, sore throat. +nasal congestion, sinus pressure. ; ; Cardiovascular: Negative for chest pain, palpitations, diaphoresis, dyspnea and peripheral edema. ; ; Respiratory: Negative for cough, wheezing and stridor. ; ; Gastrointestinal: Negative for nausea, vomiting, diarrhea, abdominal pain, blood in stool, hematemesis, jaundice and rectal bleeding. . ; ; Genitourinary: Negative for dysuria, flank pain and hematuria. ; ; Musculoskeletal: Negative for back pain and neck pain. Negative for swelling and trauma.; ; Skin: Negative for pruritus, rash, abrasions, blisters, bruising and skin lesion.; ; Neuro: +headache. Negative for lightheadedness and neck stiffness. Negative for  weakness, altered level of consciousness , altered mental status, extremity weakness, paresthesias, involuntary movement, seizure and syncope.      Allergies  Amitriptyline; Baclofen; Cymbalta; Gabapentin; Monosodium glutamate; Vicodin; Alprazolam; Magnesium salicylate; Rizatriptan; Tizanidine; Toradol; Tramadol; Adhesive; and Lamotrigine  Home Medications   Prior to Admission medications   Medication Sig Start Date End Date Taking? Authorizing Provider  Alum & Mag Hydroxide-Simeth (MAGIC MOUTHWASH W/LIDOCAINE) SOLN Take 5 mLs by mouth 3 (three) times daily as needed (For reflux pain). 10/24/13   Tanna Furry, MD  carbamazepine  (CARBATROL) 200 MG 12 hr capsule Take 200 mg by mouth 3 (three) times daily. 04/22/13 04/22/14  Historical Provider, MD  celecoxib (CELEBREX) 100 MG capsule Take 1 capsule (100 mg total) by mouth 2 (two) times daily. 11/01/13   Lenox Ahr, PA-C  chlorthalidone (HYGROTON) 25 MG tablet Take 1 tablet (25 mg total) by mouth daily. 16/07/37   Delora Fuel, MD  cyclobenzaprine (FLEXERIL) 10 MG tablet Take 10 mg by mouth 3 (three) times daily as needed for muscle spasms.    Historical Provider, MD  diphenhydrAMINE (BENADRYL) 25 MG tablet Take 50 mg by mouth every 8 (eight) hours as needed for sleep.     Historical Provider, MD  EPINEPHrine (EPIPEN) 0.3 mg/0.3 mL SOAJ injection Inject 0.3 mg into the muscle as needed (allergic reaction).     Historical Provider, MD  Fingolimod HCl (GILENYA) 0.5 MG CAPS Take 0.5 mg by mouth daily.     Historical Provider, MD  loratadine (CLARITIN) 10 MG tablet Take 10 mg by mouth daily.     Historical Provider, MD  medroxyPROGESTERone (DEPO-PROVERA) 150 MG/ML injection Inject 150 mg into the muscle every 3 (three) months. Last injection mid September    Historical Provider, MD  omeprazole (PRILOSEC) 40 MG capsule Take 40 mg by mouth daily.     Historical Provider, MD  ondansetron (ZOFRAN) 8 MG tablet Take 8 mg by mouth every 6 (six) hours as needed for nausea or vomiting.     Historical Provider, MD  potassium chloride (K-DUR) 10 MEQ tablet Take 10 mEq by mouth 2 (two) times daily.    Historical Provider, MD  pregabalin (LYRICA) 200 MG capsule Take 600 mg by mouth at bedtime.    Historical Provider, MD  promethazine (PHENERGAN) 25 MG tablet Take 25 mg by mouth every 6 (six) hours as needed for nausea or vomiting.     Historical Provider, MD  propranolol (INDERAL) 60 MG tablet Take 1 tablet (60 mg total) by mouth 2 (two) times daily. 11/23/13   Penni Bombard, MD  ranitidine (ZANTAC) 300 MG capsule Take 300 mg by mouth at bedtime.     Historical Provider, MD  topiramate  (TOPAMAX) 100 MG tablet Take 100 mg by mouth 2 (two) times daily.     Historical Provider, MD  venlafaxine (EFFEXOR) 75 MG tablet Take 75 mg by mouth 3 (three) times daily with meals.    Historical Provider, MD   BP 133/90 mmHg  Pulse 111  Temp(Src) 98.2 F (36.8 C) (Oral)  Resp 19  Ht 5\' 5"  (1.651 m)  Wt 220 lb (99.791 kg)  BMI 36.61 kg/m2  SpO2 100%  BP 113/90 mmHg  Pulse 109  Temp(Src) 98.2 F (36.8 C) (Oral)  Resp 20  Ht 5\' 5"  (1.651 m)  Wt 220 lb (99.791 kg)  BMI 36.61 kg/m2  SpO2 99%  Physical Exam  1735: Physical examination:  Nursing notes reviewed; Vital signs and O2 SAT reviewed;  Constitutional: Well developed, Well nourished, Well hydrated, In no acute distress. Sitting on stretcher cross legged on my arrival to ED exam room.; Head:  Normocephalic, atraumatic; Eyes: EOMI, PERRL, No scleral icterus; ENMT: TM's clear bilat. +edemetous nasal turbinates bilat with clear rhinorrhea. Mouth and pharynx without lesions. No tonsillar exudates. No intra-oral edema. No submandibular or sublingual edema. No hoarse voice, no drooling, no stridor. No pain with manipulation of larynx. No trismus. Mouth and pharynx normal, Mucous membranes moist; Neck: Supple, Full range of motion, No lymphadenopathy; Cardiovascular: Regular rate and rhythm, No murmur, rub, or gallop; Respiratory: Breath sounds clear & equal bilaterally, No rales, rhonchi, wheezes.  Speaking full sentences with ease, Normal respiratory effort/excursion; Chest: Nontender, Movement normal; Abdomen: Soft, Nontender, Nondistended, Normal bowel sounds; Genitourinary: No CVA tenderness; Extremities: Pulses normal, No tenderness, No edema, No calf edema or asymmetry.; Neuro: AA&Ox3, Major CN grossly intact.  Speech clear. No gross focal motor or sensory deficits in extremities. Climbs on and off stretcher easily by herself. Gait steady.; Skin: Color normal, Warm, Dry.   ED Course  Procedures    MDM  MDM Reviewed: previous  chart, nursing note and vitals      1740:  Long hx of chronic pain with multiple ED visits for same. Aware she will not receive narcotics today's ED visit.  Pt endorses acute flair of her usual long standing chronic pain today, no change from her usual chronic pain pattern.  Pt strongly encouraged to f/u with her PMD, Neuro MD, and Pain Management doctor for good continuity of care and control of her chronic pain.  Verb understanding.    Francine Graven, DO 12/06/13 [phone]

## 2013-12-04 NOTE — ED Notes (Signed)
Headache for 2 days.  Have congestion.  Rates pain 9.  Took tylenol with no relief.

## 2013-12-04 NOTE — Discharge Instructions (Signed)
°Emergency Department Resource Guide °1) Find a Doctor and Pay Out of Pocket °Although you won't have to find out who is covered by your insurance plan, it is a good idea to ask around and get recommendations. You will then need to call the office and see if the doctor you have chosen will accept you as a new patient and what types of options they offer for patients who are self-pay. Some doctors offer discounts or will set up payment plans for their patients who do not have insurance, but you will need to ask so you aren't surprised when you get to your appointment. ° °2) Contact Your Local Health Department °Not all health departments have doctors that can see patients for sick visits, but many do, so it is worth a call to see if yours does. If you don't know where your local health department is, you can check in your phone book. The CDC also has a tool to help you locate your state's health department, and many state websites also have listings of all of their local health departments. ° °3) Find a Walk-in Clinic °If your illness is not likely to be very severe or complicated, you may want to try a walk in clinic. These are popping up all over the country in pharmacies, drugstores, and shopping centers. They're usually staffed by nurse practitioners or physician assistants that have been trained to treat common illnesses and complaints. They're usually fairly quick and inexpensive. However, if you have serious medical issues or chronic medical problems, these are probably not your best option. ° °No Primary Care Doctor: °- Call [facility] Connect at  832-8000 - they can help you locate a primary care doctor that  accepts your insurance, provides certain services, etc. °- Physician Referral Service- 1-800-533-3463 ° °Chronic Pain Problems: °Organization         Address  Phone   Notes  °[facility]  ([phone]) [phone] Patients need to be referred by their primary care doctor.  ° °Medication  Assistance: °Organization         Address  Phone   Notes  °[place] [facility] [address] °[place], [place] [address] (336) 641-8030 --Must be a resident of [place] °-- Must have NO insurance coverage whatsoever (no Medicaid/ Medicare, etc.) °-- The pt. MUST have a primary care doctor that directs their care regularly and follows them in the community °  °[facility]  (866) 331-1348   °[employer]  (888) 892-1162   ° °Agencies that provide inexpensive medical care: °Organization         Address  Phone   Notes  °[facility]  (336) 832-8035   °[facility]    (336) 832-7272   °[facility] [address] °[place],  [address] (336) 832-4777   °[facility] [address], °[place] (336) 271-4999   °[facility]    (336) 373-0678   °[facility]    (336) 272-1050   °[facility] ° [address], [place] Phone:  (336) 832-4444, Fax:  (336) 832-4440 Hours of Operation:  9 am - 6 pm, M-F.  Also accepts Medicaid/Medicare and self-pay.  °[facility] ° [address], [place] Phone: (336) [phone], Fax: (336) [phone]. Hours of Operation:  8:30 am - 5:30 pm, M-F.  Also accepts Medicaid and self-pay.  °[facility] [place] [address], [place] Phone: (336) 878-6027   °[facility] [address], [place], [place] (336)723-1848, Ext. 123 Mondays & Thursdays: 7-9 AM.  First 15 patients are seen on a first come, first serve basis. °  ° °Medicaid-accepting [place] Providers: ° °Organization         Address  Phone   Notes  °[facility] [address], [place] (336) 641-2100 Also accepts self-pay patients.  °[facility] [address], [place] ° (336) [phone]   °[facility] [address], [place]  (336) 288-8857   °[facility] [address] [place] [address], [place] (336) 299-7000   °Veita Bland [address], [place]  ° (336) 373-1557 Only accepts [place] Access Medicaid patients after they have their name applied to their card.  ° °Self-Pay (no insurance) in [place]: ° °Organization         Address  Phone   Notes  °Sickle Cell Patients, [facility] [address], [place] (336) 832-1970   °[facility] [address], [place] (336) 832-4400   °[facility] ° [address], [place] (336) 992-4800   °[facility]/Dr. Osei-Bonsu ° [address] [place] [address], [place] or [address], [place] (336) [phone] Phone number for both [place] and [place] locations is the same.  °[facility] [address], [place] (336) 299-0000   °[facility] [address] [place] [address], [place] or [address] (336) 852-7530 °(336) 878-2260   °[facility] [address], [place] (336) 350-1642, phone; (336) 294-5005, fax Sees patients 1st and 3rd Saturday of every month.  Must not qualify for public or private insurance (i.e. Medicaid, Medicare, [place] Health Choice, Veterans' Benefits) • Household income should be no more than 200% of the poverty level •The clinic cannot treat you if you are pregnant or think you are pregnant • Sexually transmitted diseases are not treated at the clinic.  ° ° °Dental Care: °Organization         Address  Phone  Notes  °[place] [facility] [address], [place] (336) 641-6152 Accepts children up to age 21 who are enrolled in Medicaid or [place] Health Choice; pregnant women with a Medicaid card; and children who have applied for Medicaid or [place] Health Choice, but were declined, whose parents can pay a reduced fee at time of service.  °[place]  [facility] [place]  [address], [place] (336) 641-7733 Accepts children up to age 21 who are enrolled in Medicaid or [place] Health Choice; pregnant women with a Medicaid card; and children who have applied for Medicaid or [place] Health Choice, but were declined, whose parents can pay a reduced fee at time of service.  °[facility] ° [address], [place] (336) 641-4533 Patients are seen by appointment only. Walk-ins are not accepted. [facility] will see patients 18 years of age and older. °Monday - Tuesday (8am-5pm) °Most Wednesdays (8:30-5pm) °$30 per visit, cash only  °[facility] ° [address], [place] (336) 641-4533 Patients are seen by appointment only. Walk-ins are not accepted. [facility] will see patients 18 years of age and older. °One   Wednesday Evening (Monthly: Volunteer Based).  $30 per visit, cash only  °[facility]  (919) [phone] for adults; Children under age 4, call Graduate Pediatric Dentistry at ([phone]) [phone]. Children aged 4-14, please call ([phone]) [phone] to request a pediatric application. ° Dental services are provided in all areas of dental care including fillings, crowns and bridges, complete and partial dentures, implants, gum treatment, root canals, and extractions. Preventive care is also provided. Treatment is provided to both adults and children. °Patients are selected via a lottery and there is often a waiting list. °  °[facility] [address], °[place] ° (336) 763-8833 [email or web address] °  °[facility] [address], [place], [place] (336)723-1848, Ext. 123 Second and Fourth Thursday of each month, opens at 6:30 AM; Clinic ends at 9 AM.  Patients are seen on a first-come first-served basis, and a limited number are seen during each clinic.  ° °[facility] ° [address], [place], [place] (336) [phone]    Eligibility Requirements °You must have lived in [place], [place], or [place] counties for at least the last three months. °  You cannot be eligible for state or federal sponsored healthcare insurance, including Veterans Administration, Medicaid, or Medicare. °  You generally cannot be eligible for healthcare insurance through your employer.  °  How to apply: °Eligibility screenings are held every Tuesday and Wednesday afternoon from 1:00 pm until 4:00 pm. You do not need an appointment for the interview!  °[facility] [address], [place], [place] 336-631-2330   °[facility]  336-342-8273   °[facility]  336-703-3100   °[facility]  336-570-6415   ° °Behavioral Health Resources in the Community: °Intensive Outpatient Programs °Organization         Address  Phone  Notes  °[facility] [address], [place], [place] 336-878-6098   °[facility] [address], [place], [place] 336-832-9800   °[facility] [address], [place], [place] ° 336-882-2125   °[facility] [address],  °[place], [place] 1-800-853-5163 or 336-641-4981   °Substance Abuse Resources °Organization         Address  Phone  Notes  °[facility]  336-882-2125   °[facility]  336-784-9470   °[facility]  336-285-9073   °[facility]  336-845-3988   °Residential & Outpatient Substance Abuse Program  1-800-659-3381   °Psychological Services °Organization         Address  Phone  Notes  °[facility]  336- 832-9600   °[facility]  336- 378-7881   °[facility] [address], [place] 1-800-853-5163 or 336-641-4981   ° °Mobile Crisis Teams °Organization         Address  Phone  Notes  °[facility], Mobile Crisis Care Unit  1-877-626-1772   °Assertive °[facility] ° [address]  [place], [place] 336-834-9664   °Sharon DeEsch [address] °[place] 336-554-5454   ° °Self-Help/Support Groups °Organization         Address  Phone             Notes  °[facility] - variety of support groups  336- 373-1402 Call for more information  °Narcotics Anonymous (NA), [facility] [address], °[place]   2 meetings at this location  ° °  Residential Treatment Programs Organization         Address  Phone  Notes  [facility] [address],    [place]  1-9730612251   [facility]  [address] [place] [address], [place], [place]   [place], [place] 650-205-3882 Admissions: 8am-3pm M-F  [facility] [address]    [place], [place] 884-166-0630   [facility] [address] [place], [place], [place]   [facility] [address],  [place], [place]   [facility] - Intensive [address]patient [address], [place], [place]   [facility]  [place], [place] 1-7740458525 or 908-511-1564   [facility] [address] [place], [place] Accepts Medicaid  [facility] [address],  [place] 1-(367)874-5183 Substance Abuse/Addiction Treatment   [facility]         Address  Phone  Notes  [facility]  [phone]   Domenic Schwab, PhD [address] [place], [place]   423-551-3742 or 415-382-3814   [place], [place] 773-166-5384   [facility] [address] [place], [place] 610-504-8452 Insurance/Medicaid/sponsorship through [facility] and Families [address] [place]                                    [place], [place] (251) [phone] [place] [address], [place] [phone]    Dr. Adele Schilder  ([phone]   [facility] Dept. 1) [address] [place] 2) [place] 3)  [address], [place] (520)[phone]  (613)[phone]   [place] 870-863-5139 or [phone] (After Hours)      Take your usual prescriptions as previously directed.  Take over the counter decongestant, as directed on packaging, for the next week.  Use over the counter normal saline nasal spray, as instructed in the Emergency Department, several times per day for the next 2 weeks.  Call your regular medical doctor and your Neurologist on Monday to schedule a follow up appointment this week.  Return to the Emergency Department immediately if worsening.

## 2013-12-05 ENCOUNTER — Emergency Department (HOSPITAL_COMMUNITY)
Admission: EM | Admit: 2013-12-05 | Discharge: 2013-12-06 | Disposition: A | Discharge status: Home / Self Care | Payer: Medicare Other | Attending: Emergency Medicine | Admitting: Emergency Medicine

## 2013-12-05 ENCOUNTER — Encounter (HOSPITAL_COMMUNITY): Payer: Self-pay

## 2013-12-05 DIAGNOSIS — Z8742 Personal history of other diseases of the female genital tract: Secondary | ICD-10-CM | POA: Diagnosis not present

## 2013-12-05 DIAGNOSIS — F419 Anxiety disorder, unspecified: Secondary | ICD-10-CM | POA: Diagnosis not present

## 2013-12-05 DIAGNOSIS — G8929 Other chronic pain: Secondary | ICD-10-CM | POA: Diagnosis not present

## 2013-12-05 DIAGNOSIS — G43909 Migraine, unspecified, not intractable, without status migrainosus: Secondary | ICD-10-CM | POA: Insufficient documentation

## 2013-12-05 DIAGNOSIS — K5901 Slow transit constipation: Secondary | ICD-10-CM

## 2013-12-05 DIAGNOSIS — F329 Major depressive disorder, single episode, unspecified: Secondary | ICD-10-CM | POA: Insufficient documentation

## 2013-12-05 DIAGNOSIS — Z79899 Other long term (current) drug therapy: Secondary | ICD-10-CM | POA: Diagnosis not present

## 2013-12-05 DIAGNOSIS — J45909 Unspecified asthma, uncomplicated: Secondary | ICD-10-CM | POA: Diagnosis not present

## 2013-12-05 DIAGNOSIS — R109 Unspecified abdominal pain: Secondary | ICD-10-CM | POA: Diagnosis present

## 2013-12-05 DIAGNOSIS — K219 Gastro-esophageal reflux disease without esophagitis: Secondary | ICD-10-CM | POA: Insufficient documentation

## 2013-12-05 NOTE — ED Notes (Signed)
Patient is complaining of abdominal pain, has a history of bowel obstruction with surgery.

## 2013-12-06 ENCOUNTER — Emergency Department (HOSPITAL_COMMUNITY): Payer: Medicare Other

## 2013-12-06 ENCOUNTER — Telehealth: Payer: Self-pay | Admitting: Diagnostic Neuroimaging

## 2013-12-06 DIAGNOSIS — K5901 Slow transit constipation: Secondary | ICD-10-CM | POA: Diagnosis not present

## 2013-12-06 LAB — CBC WITH DIFFERENTIAL/PLATELET
BASOS PCT: 0 % (ref 0–1)
Basophils Absolute: 0 10*3/uL (ref 0.0–0.1)
EOS ABS: 0 10*3/uL (ref 0.0–0.7)
Eosinophils Relative: 0 % (ref 0–5)
HCT: 40.2 % (ref 36.0–46.0)
Hemoglobin: 13.6 g/dL (ref 12.0–15.0)
LYMPHS PCT: 7 % — AB (ref 12–46)
Lymphs Abs: 1 10*3/uL (ref 0.7–4.0)
MCH: 31.5 pg (ref 26.0–34.0)
MCHC: 33.8 g/dL (ref 30.0–36.0)
MCV: 93.1 fL (ref 78.0–100.0)
Monocytes Absolute: 0.6 10*3/uL (ref 0.1–1.0)
Monocytes Relative: 4 % (ref 3–12)
Neutro Abs: 13.9 10*3/uL — ABNORMAL HIGH (ref 1.7–7.7)
Neutrophils Relative %: 89 % — ABNORMAL HIGH (ref 43–77)
PLATELETS: 348 10*3/uL (ref 150–400)
RBC: 4.32 MIL/uL (ref 3.87–5.11)
RDW: 13.8 % (ref 11.5–15.5)
WBC: 15.5 10*3/uL — AB (ref 4.0–10.5)

## 2013-12-06 LAB — COMPREHENSIVE METABOLIC PANEL
ALT: 19 U/L (ref 0–35)
AST: 17 U/L (ref 0–37)
Albumin: 3.5 g/dL (ref 3.5–5.2)
Alkaline Phosphatase: 73 U/L (ref 39–117)
Anion gap: 16 — ABNORMAL HIGH (ref 5–15)
BUN: 12 mg/dL (ref 6–23)
CALCIUM: 9.1 mg/dL (ref 8.4–10.5)
CO2: 23 mEq/L (ref 19–32)
CREATININE: 0.72 mg/dL (ref 0.50–1.10)
Chloride: 106 mEq/L (ref 96–112)
GLUCOSE: 164 mg/dL — AB (ref 70–99)
Potassium: 3.7 mEq/L (ref 3.7–5.3)
SODIUM: 145 meq/L (ref 137–147)
Total Protein: 7.1 g/dL (ref 6.0–8.3)

## 2013-12-06 LAB — LIPASE, BLOOD: Lipase: 30 U/L (ref 11–59)

## 2013-12-06 MED ORDER — LACTULOSE 10 GM/15ML PO SOLN: 10.0000 g | Freq: Two times a day (BID) | ORAL | Status: DC | PRN

## 2013-12-06 MED ORDER — LACTULOSE 10 GM/15ML PO SOLN
10.0000 g | Freq: Once | ORAL | Status: AC
  Administered 2013-12-06: 10 g via ORAL
  Filled 2013-12-06: qty 30

## 2013-12-06 NOTE — ED Provider Notes (Signed)
CSN: [id number]     Arrival date & time 12/05/13  2340 History   First MD Initiated Contact with Patient 12/06/13 0130     Chief Complaint  Patient presents with  . Abdominal Pain     (Consider location/radiation/quality/duration/timing/severity/associated sxs/prior Treatment) HPI   Emily [name] is a 33 y.o. female  who has both chronic abdominal pain and chronic headaches.  She is on chronic oral narcotic therapy.  She presents tonight for abdominal pain that she initially states has been present for about a day and a half.  When confronted, she does admit that the pain has been present longer in that it was there when she was evaluated 3 days ago for abdominal pain with a CT scan of the abdomen.  At that time she was told that she had increased stool, and may be constipated.  She has not tried specific medications, for constipation.  She was in the emergency department yesterday with chronic headache and was treated with a headache cocktail.  She is taking her usual medications, without relief.  The pain is in her upper abdomen. She has nausea without vomiting.  She denies fever.  There are no other known modifying factors.  Past Medical History  Diagnosis Date  . Headache(784.0)     migraines  . Multiple sclerosis   . Asthma as a child  . GERD (gastroesophageal reflux disease)   . Urinary urgency   . Anxiety   . Perforated bowel 2009  . Migraines   . Depression   . Chronic back pain   . Chronic pain   . Ovarian cyst    Past Surgical History  Procedure Laterality Date  . Extremity cyst excision  1994    right leg  . 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 Colostomy closure  04/2007  . Scar revision  01/21/2011    Procedure: SCAR REVISION;  Surgeon: Hermelinda Dellen;  Location: [facility];  Service: Plastics;  Laterality: N/A;  exploration of scar of abdomen and repair of defect  . Hernia repair    . Abdominal surgery    . Appendectomy     Family  History  Problem Relation Age of Onset  . Diabetes Mother   . Hypertension Mother   . Diabetes Father   . Hypertension Father    History  Substance Use Topics  . Smoking status: Current Some Day Smoker -- 0.40 packs/day for 10 years    Types: Cigarettes  . Smokeless tobacco: Never Used     Comment: quit smoking 10 days ago  . Alcohol Use: Yes     Comment: Rare   OB History    No data available     Review of Systems  All other systems reviewed and are negative.     Allergies  Amitriptyline; Baclofen; Cymbalta; Gabapentin; Monosodium glutamate; Vicodin; Alprazolam; Magnesium salicylate; Rizatriptan; Tizanidine; Toradol; Tramadol; Adhesive; and Lamotrigine  Home Medications   Prior to Admission medications   Medication Sig Start Date End Date Taking? Authorizing Provider  Alum & Mag Hydroxide-Simeth (MAGIC MOUTHWASH W/LIDOCAINE) SOLN Take 5 mLs by mouth 3 (three) times daily as needed (For reflux pain). 10/24/13   Tanna Furry, MD  carbamazepine (CARBATROL) 200 MG 12 hr capsule Take 200 mg by mouth 3 (three) times daily. 04/22/13 04/22/14  Historical Provider, MD  celecoxib (CELEBREX) 100 MG capsule Take 1 capsule (100 mg total) by mouth 2 (two) times daily. 11/01/13   Evorn Gong  Marshell Levan, PA-C  chlorthalidone (HYGROTON) 25 MG tablet Take 1 tablet (25 mg total) by mouth daily. 81/01/75   Delora Fuel, MD  cyclobenzaprine (FLEXERIL) 10 MG tablet Take 10 mg by mouth 3 (three) times daily as needed for muscle spasms.    Historical Provider, MD  diphenhydrAMINE (BENADRYL) 25 MG tablet Take 50 mg by mouth every 8 (eight) hours as needed for sleep.     Historical Provider, MD  EPINEPHrine (EPIPEN) 0.3 mg/0.3 mL SOAJ injection Inject 0.3 mg into the muscle as needed (allergic reaction).     Historical Provider, MD  Fingolimod HCl (GILENYA) 0.5 MG CAPS Take 0.5 mg by mouth daily.     Historical Provider, MD  loratadine (CLARITIN) 10 MG tablet Take 10 mg by mouth daily.     Historical Provider,  MD  medroxyPROGESTERone (DEPO-PROVERA) 150 MG/ML injection Inject 150 mg into the muscle every 3 (three) months. Last injection mid September    Historical Provider, MD  omeprazole (PRILOSEC) 40 MG capsule Take 40 mg by mouth daily.     Historical Provider, MD  ondansetron (ZOFRAN) 8 MG tablet Take 8 mg by mouth every 6 (six) hours as needed for nausea or vomiting.     Historical Provider, MD  potassium chloride (K-DUR) 10 MEQ tablet Take 10 mEq by mouth 2 (two) times daily.    Historical Provider, MD  pregabalin (LYRICA) 200 MG capsule Take 600 mg by mouth at bedtime.    Historical Provider, MD  promethazine (PHENERGAN) 25 MG tablet Take 25 mg by mouth every 6 (six) hours as needed for nausea or vomiting.     Historical Provider, MD  propranolol (INDERAL) 60 MG tablet Take 1 tablet (60 mg total) by mouth 2 (two) times daily. 11/23/13   Penni Bombard, MD  ranitidine (ZANTAC) 300 MG capsule Take 300 mg by mouth at bedtime.     Historical Provider, MD  topiramate (TOPAMAX) 100 MG tablet Take 100 mg by mouth 2 (two) times daily.     Historical Provider, MD  venlafaxine (EFFEXOR) 75 MG tablet Take 75 mg by mouth 3 (three) times daily with meals.    Historical Provider, MD   BP 133/78 mmHg  Pulse 129  Temp(Src) 98.9 F (37.2 C) (Oral)  Resp 22  Ht 5\' 5"  (1.651 m)  Wt 220 lb (99.791 kg)  BMI 36.61 kg/m2  SpO2 100% Physical Exam  Constitutional: She is oriented to person, place, and time. She appears well-developed. She appears distressed (She appears uncomfortable).  She is obese  HENT:  Head: Normocephalic and atraumatic.  Right Ear: External ear normal.  Left Ear: External ear normal.  Eyes: Conjunctivae and EOM are normal. Pupils are equal, round, and reactive to light.  Neck: Normal range of motion and phonation normal. Neck supple.  Cardiovascular: Normal rate, regular rhythm and normal heart sounds.   Pulmonary/Chest: Effort normal and breath sounds normal. She exhibits no bony  tenderness.  Abdominal: Soft. She exhibits no mass. There is tenderness (diffuse upper abdominal tenderness, mild). There is no rebound and no guarding.  She is able to sit up without difficulty, therefore indicating that she does not have peritoneal irritation  Musculoskeletal: Normal range of motion.  Neurological: She is alert and oriented to person, place, and time. No cranial nerve deficit or sensory deficit. She exhibits normal muscle tone. Coordination normal.  Skin: Skin is warm, dry and intact.  Psychiatric: She has a normal mood and affect. Her behavior is normal. Judgment and thought  content normal.  Nursing note and vitals reviewed.   ED Course  Procedures (including critical care time)  The patient and her husband asked for narcotic medication.  They were informed that the patient would not be receiving narcotic medication, after the initial evaluation, since it did not appear that she needed narcotic medication at that time.  The patient indicated that she wanted a workup done, and not simply be treated for constipation.  Since she has had 3 CT scans of the abdomen this year, which were negative for acute pathology, I do not think that it will be necessary to repeat advanced imaging tonight.  The risk of repeat CT scan, in a patient with chronic pain, does not justify another scan today.  The patient will be evaluated with blood work, and plain images of the abdomen.  I suspect that she isn't exhibiting drug seeking behavior at this time.  Medications - No data to display  Patient Vitals for the past 24 hrs:  BP Temp Temp src Pulse Resp SpO2 Height Weight  12/05/13 2347 133/78 mmHg 98.9 F (37.2 C) Oral (!) 129 22 100 % 5\' 5"  (1.651 m) 220 lb (99.791 kg)    3:30 AM Reevaluation with update and discussion. After initial assessment and treatment, an updated evaluation reveals patient is alert, and cooperative.  She is tearful, and wonders why I didn't give her narcotic pain  medication.  I explained that I did not feel there was indication that she should receive narcotics in the emergency department setting.  She stated that she understood why I sent this.  She agreed to taking a dose of lactulose to treat her constipation.  I discussed the findings with the patient and her husband, all questions were answered.. Louann Review Labs Reviewed - No data to display  Imaging Review No results found.   EKG Interpretation None      MDM   Final diagnoses:  Slow transit constipation  Chronic pain    Chronic abdominal pain with a suggestion of constipation; based on clinical and imaging evaluation.  There is no indication for acute bacterial infection, metabolic instability. bowel obstruction or suspected, serious intra-abdominal process.   Nursing Notes Reviewed/ Care Coordinated Applicable Imaging Reviewed Interpretation of Laboratory Data incorporated into ED treatment  The patient appears reasonably screened and/or stabilized for discharge and I doubt any other medical condition or other [facility] requiring further screening, evaluation, or treatment in the ED at this time prior to discharge.  Plan: Home Medications- Lactulose; Home Treatments- rest; return here if the recommended treatment, does not improve the symptoms; Recommended follow up- PCP prn    Richarda Blade, MD 12/06/13 [phone]

## 2013-12-06 NOTE — ED Notes (Signed)
Pt stated she has had multiple BM's and has passed gas. Stated she had taken medication to help her "go" and she thinks she has had "anal leakage" because of that. States she has "bad abdominal pain like a bowel obstruction". I asked patient about her bowel patterns at home and we discussed medications she takes. Spoke about multiple visists to ED and narcotics given and effects of Bowel patterns. Pt states that Ed is the only place she gets them, Also states she tries improve the fiber in her diet. Spoke about her dr's referring her to ED for help, and explained that the EDP would be in shortly.

## 2013-12-06 NOTE — Discharge Instructions (Signed)
Chronic Pain Chronic pain can be defined as pain that is off and on and lasts for 3-6 months or longer. Many things cause chronic pain, which can make it difficult to make a diagnosis. There are many treatment options available for chronic pain. However, finding a treatment that works well for you may require trying various approaches until the right one is found. Many people benefit from a combination of two or more types of treatment to control their pain. SYMPTOMS  Chronic pain can occur anywhere in the body and can range from mild to very severe. Some types of chronic pain include:  Headache.  Low back pain.  Cancer pain.  Arthritis pain.  Neurogenic pain. This is pain resulting from damage to nerves. People with chronic pain may also have other symptoms such as:  Depression.  Anger.  Insomnia.  Anxiety. DIAGNOSIS  Your health care provider will help diagnose your condition over time. In many cases, the initial focus will be on excluding possible conditions that could be causing the pain. Depending on your symptoms, your health care provider may order tests to diagnose your condition. Some of these tests may include:   Blood tests.   CT scan.   MRI.   X-rays.   Ultrasounds.   Nerve conduction studies.  You may need to see a specialist.  TREATMENT  Finding treatment that works well may take time. You may be referred to a pain specialist. He or she may prescribe medicine or therapies, such as:   Mindful meditation or yoga.  Shots (injections) of numbing or pain-relieving medicines into the spine or area of pain.  Local electrical stimulation.  Acupuncture.   Massage therapy.   Aroma, color, light, or sound therapy.   Biofeedback.   Working with a physical therapist to keep from getting stiff.   Regular, gentle exercise.   Cognitive or behavioral therapy.   Group support.  Sometimes, surgery may be recommended.  HOME CARE INSTRUCTIONS    Take all medicines as directed by your health care provider.   Lessen stress in your life by relaxing and doing things such as listening to calming music.   Exercise or be active as directed by your health care provider.   Eat a healthy diet and include things such as vegetables, fruits, fish, and lean meats in your diet.   Keep all follow-up appointments with your health care provider.   Attend a support group with others suffering from chronic pain. SEEK MEDICAL CARE IF:   Your pain gets worse.   You develop a new pain that was not there before.   You cannot tolerate medicines given to you by your health care provider.   You have new symptoms since your last visit with your health care provider.  SEEK IMMEDIATE MEDICAL CARE IF:   You feel weak.   You have decreased sensation or numbness.   You lose control of bowel or bladder function.   Your pain suddenly gets much worse.   You develop shaking.  You develop chills.  You develop confusion.  You develop chest pain.  You develop shortness of breath.  MAKE SURE YOU:  Understand these instructions.  Will watch your condition.  Will get help right away if you are not doing well or get worse. Document Released: 09/15/2001 Document Revised: 08/26/2012 Document Reviewed: 06/19/2012 [facility] Patient Information 2015 [place], [place]. This information is not intended to replace advice given to you by your health care provider. Make sure you discuss any  questions you have with your health care provider.  Constipation Constipation is when a person:  Poops (has a bowel movement) less than 3 times a week.  Has a hard time pooping.  Has poop that is dry, hard, or bigger than normal. HOME CARE   Eat foods with a lot of fiber in them. This includes fruits, vegetables, beans, and whole grains such as brown rice.  Avoid fatty foods and foods with a lot of sugar. This includes french fries, hamburgers,  cookies, candy, and soda.  If you are not getting enough fiber from food, take products with added fiber in them (supplements).  Drink enough fluid to keep your pee (urine) clear or pale yellow.  Exercise on a regular basis, or as told by your doctor.  Go to the restroom when you feel like you need to poop. Do not hold it.  Only take medicine as told by your doctor. Do not take medicines that help you poop (laxatives) without talking to your doctor first. GET HELP RIGHT AWAY IF:   You have bright red blood in your poop (stool).  Your constipation lasts more than 4 days or gets worse.  You have belly (abdominal) or butt (rectal) pain.  You have thin poop (as thin as a pencil).  You lose weight, and it cannot be explained. MAKE SURE YOU:   Understand these instructions.  Will watch your condition.  Will get help right away if you are not doing well or get worse. Document Released: 06/12/2007 Document Revised: 12/29/2012 Document Reviewed: 10/05/2012 [facility] Patient Information 2015 [place], [place]. This information is not intended to replace advice given to you by your health care provider. Make sure you discuss any questions you have with your health care provider.

## 2013-12-06 NOTE — Telephone Encounter (Signed)
Patient stated she's had Migraine for 4 days.  Requesting an appointment for IV Infusion.  Please call and advise.

## 2013-12-06 NOTE — ED Notes (Signed)
Discharge instructions given, pt demonstrated teach back and verbal understanding. No concerns voiced.  

## 2013-12-06 NOTE — Telephone Encounter (Signed)
Ok to come in for depacon infusion. -VRP

## 2013-12-07 ENCOUNTER — Encounter (HOSPITAL_COMMUNITY): Payer: Self-pay | Admitting: *Deleted

## 2013-12-07 ENCOUNTER — Encounter: Payer: Self-pay | Admitting: *Deleted

## 2013-12-07 ENCOUNTER — Ambulatory Visit (INDEPENDENT_AMBULATORY_CARE_PROVIDER_SITE_OTHER): Payer: Medicare Other | Admitting: *Deleted

## 2013-12-07 ENCOUNTER — Emergency Department (HOSPITAL_COMMUNITY)
Admission: EM | Admit: 2013-12-07 | Discharge: 2013-12-07 | Disposition: A | Discharge status: Home / Self Care | Payer: Medicare Other | Attending: Emergency Medicine | Admitting: Emergency Medicine

## 2013-12-07 VITALS — BP 169/125 | HR 97

## 2013-12-07 DIAGNOSIS — Z79899 Other long term (current) drug therapy: Secondary | ICD-10-CM | POA: Diagnosis not present

## 2013-12-07 DIAGNOSIS — R51 Headache: Secondary | ICD-10-CM | POA: Diagnosis present

## 2013-12-07 DIAGNOSIS — Z791 Long term (current) use of non-steroidal anti-inflammatories (NSAID): Secondary | ICD-10-CM | POA: Diagnosis not present

## 2013-12-07 DIAGNOSIS — Z720 Tobacco use: Secondary | ICD-10-CM | POA: Diagnosis not present

## 2013-12-07 DIAGNOSIS — R000 Tachycardia, unspecified: Secondary | ICD-10-CM | POA: Diagnosis not present

## 2013-12-07 DIAGNOSIS — J45909 Unspecified asthma, uncomplicated: Secondary | ICD-10-CM | POA: Diagnosis not present

## 2013-12-07 DIAGNOSIS — G4489 Other headache syndrome: Secondary | ICD-10-CM

## 2013-12-07 DIAGNOSIS — Z8742 Personal history of other diseases of the female genital tract: Secondary | ICD-10-CM | POA: Insufficient documentation

## 2013-12-07 DIAGNOSIS — G35 Multiple sclerosis: Secondary | ICD-10-CM | POA: Diagnosis not present

## 2013-12-07 DIAGNOSIS — K219 Gastro-esophageal reflux disease without esophagitis: Secondary | ICD-10-CM | POA: Diagnosis not present

## 2013-12-07 DIAGNOSIS — Z765 Malingerer [conscious simulation]: Secondary | ICD-10-CM | POA: Diagnosis not present

## 2013-12-07 DIAGNOSIS — F329 Major depressive disorder, single episode, unspecified: Secondary | ICD-10-CM | POA: Insufficient documentation

## 2013-12-07 DIAGNOSIS — G8929 Other chronic pain: Secondary | ICD-10-CM | POA: Insufficient documentation

## 2013-12-07 DIAGNOSIS — G43109 Migraine with aura, not intractable, without status migrainosus: Secondary | ICD-10-CM

## 2013-12-07 DIAGNOSIS — F419 Anxiety disorder, unspecified: Secondary | ICD-10-CM | POA: Insufficient documentation

## 2013-12-07 MED ORDER — VALPROATE SODIUM 500 MG/5ML IV SOLN
1000.0000 mg | INTRAVENOUS | Status: DC
  Administered 2013-12-07: 1000 mg via INTRAVENOUS

## 2013-12-07 NOTE — ED Notes (Signed)
Pt complaining of headache again today & having a dry cough.

## 2013-12-07 NOTE — ED Provider Notes (Signed)
CSN: [id number]     Arrival date & time 12/07/13  0506 History   First MD Initiated Contact with Patient 12/07/13 0522     Chief Complaint  Patient presents with  . Headache     (Consider location/radiation/quality/duration/timing/severity/associated sxs/prior Treatment) HPI   Emily [name] is a 33 y.o. female who presents for evaluation of headache.  She has ongoing headache.  I saw her for similar symptoms, about 24 hours ago.  This morning she contacted her neurology service, and was told that she could come in for a Depakote infusion, later today.  She decided to come here today to get her usual headache cocktail, which is recommended by her neurologist, including Dilaudid, according to her.  I explained to her that her care plan, states that she should not be treated with narcotics, for headache.  She did not seem to understand the care plan or how it was developed and used.  She became confrontational, angry and accusatory.  At that point, I discontinued the interaction and asked that she leave.  The female individual with her, also was accusatory, and unhelpful.    Past Medical History  Diagnosis Date  . Headache(784.0)     migraines  . Multiple sclerosis   . Asthma as a child  . GERD (gastroesophageal reflux disease)   . Urinary urgency   . Anxiety   . Perforated bowel 2009  . Migraines   . Depression   . Chronic back pain   . Chronic pain   . Ovarian cyst    Past Surgical History  Procedure Laterality Date  . Extremity cyst excision  1994    right leg  . 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 Colostomy closure  04/2007  . Scar revision  01/21/2011    Procedure: SCAR REVISION;  Surgeon: Hermelinda Dellen;  Location: [place];  Service: Plastics;  Laterality: N/A;  exploration of scar of abdomen and repair of defect  . Hernia repair    . Abdominal surgery    . Appendectomy     Family History  Problem Relation Age of Onset  . Diabetes Mother    . Hypertension Mother   . Diabetes Father   . Hypertension Father    History  Substance Use Topics  . Smoking status: Current Some Day Smoker -- 0.40 packs/day for 10 years    Types: Cigarettes  . Smokeless tobacco: Never Used     Comment: quit smoking 10 days ago  . Alcohol Use: Yes     Comment: Rare   OB History    No data available     Review of Systems  Unable to perform ROS     Allergies  Amitriptyline; Baclofen; Cymbalta; Gabapentin; Monosodium glutamate; Vicodin; Alprazolam; Magnesium salicylate; Rizatriptan; Tizanidine; Toradol; Tramadol; Adhesive; and Lamotrigine  Home Medications   Prior to Admission medications   Medication Sig Start Date End Date Taking? Authorizing Provider  Alum & Mag Hydroxide-Simeth (MAGIC MOUTHWASH W/LIDOCAINE) SOLN Take 5 mLs by mouth 3 (three) times daily as needed (For reflux pain). 10/24/13  Yes Tanna Furry, MD  carbamazepine (CARBATROL) 200 MG 12 hr capsule Take 200 mg by mouth 3 (three) times daily. 04/22/13 04/22/14 Yes Historical Provider, MD  celecoxib (CELEBREX) 100 MG capsule Take 1 capsule (100 mg total) by mouth 2 (two) times daily. 11/01/13  Yes Lenox Ahr, PA-C  chlorthalidone (HYGROTON) 25 MG tablet Take 1 tablet (25 mg total) by mouth daily.  57/32/20  Yes Delora Fuel, MD  cyclobenzaprine (FLEXERIL) 10 MG tablet Take 10 mg by mouth 3 (three) times daily as needed for muscle spasms.   Yes Historical Provider, MD  diphenhydrAMINE (BENADRYL) 25 MG tablet Take 50 mg by mouth every 8 (eight) hours as needed for sleep.    Yes Historical Provider, MD  EPINEPHrine (EPIPEN) 0.3 mg/0.3 mL SOAJ injection Inject 0.3 mg into the muscle as needed (allergic reaction).    Yes Historical Provider, MD  Fingolimod HCl (GILENYA) 0.5 MG CAPS Take 0.5 mg by mouth daily.    Yes Historical Provider, MD  lactulose (CHRONULAC) 10 GM/15ML solution Take 15 mLs (10 g total) by mouth 2 (two) times daily as needed for mild constipation or moderate  constipation. 12/06/13  Yes Richarda Blade, MD  loratadine (CLARITIN) 10 MG tablet Take 10 mg by mouth daily.    Yes Historical Provider, MD  medroxyPROGESTERone (DEPO-PROVERA) 150 MG/ML injection Inject 150 mg into the muscle every 3 (three) months. Last injection mid September   Yes Historical Provider, MD  omeprazole (PRILOSEC) 40 MG capsule Take 40 mg by mouth daily.    Yes Historical Provider, MD  ondansetron (ZOFRAN) 8 MG tablet Take 8 mg by mouth every 6 (six) hours as needed for nausea or vomiting.    Yes Historical Provider, MD  potassium chloride (K-DUR) 10 MEQ tablet Take 10 mEq by mouth 2 (two) times daily.   Yes Historical Provider, MD  pregabalin (LYRICA) 200 MG capsule Take 600 mg by mouth at bedtime.   Yes Historical Provider, MD  promethazine (PHENERGAN) 25 MG tablet Take 25 mg by mouth every 6 (six) hours as needed for nausea or vomiting.    Yes Historical Provider, MD  propranolol (INDERAL) 60 MG tablet Take 1 tablet (60 mg total) by mouth 2 (two) times daily. 11/23/13  Yes Penni Bombard, MD  ranitidine (ZANTAC) 300 MG capsule Take 300 mg by mouth at bedtime.    Yes Historical Provider, MD  topiramate (TOPAMAX) 100 MG tablet Take 100 mg by mouth 2 (two) times daily.    Yes Historical Provider, MD  venlafaxine (EFFEXOR) 75 MG tablet Take 75 mg by mouth 3 (three) times daily with meals.   Yes Historical Provider, MD   BP 132/112 mmHg  Pulse 101  Temp(Src) 98.2 F (36.8 C) (Oral)  Resp 18  Ht 5\' 5"  (1.651 m)  Wt 220 lb (99.791 kg)  BMI 36.61 kg/m2  SpO2 100% Physical Exam  Constitutional: She is oriented to person, place, and time. She appears well-developed and well-nourished.  HENT:  Head: Normocephalic and atraumatic.  Eyes: EOM are normal.  Neck: Normal range of motion and phonation normal. Neck supple.  Cardiovascular:  Minimal tachycardia  Pulmonary/Chest: Effort normal. She exhibits no bony tenderness.  Musculoskeletal: Normal range of motion.   Neurological: She is alert and oriented to person, place, and time. No cranial nerve deficit or sensory deficit. She exhibits normal muscle tone. Coordination normal.  Skin: Skin is warm, dry and intact.  Psychiatric:  She is angry  Nursing note and vitals reviewed.   ED Course  Procedures (including critical care time)     Labs Review Labs Reviewed - No data to display  Imaging Review Dg Abd Acute W/chest  12/06/2013   CLINICAL DATA:  Acute onset of lower abdominal pain, nausea and vomiting. Subsequent encounter.  EXAM: ACUTE ABDOMEN SERIES (ABDOMEN 2 VIEW & CHEST 1 VIEW)  COMPARISON:  Chest and abdominal radiographs performed  11/29/2013, and CT of the abdomen and pelvis performed 12/03/2013  FINDINGS: The lungs are relatively well-aerated and clear. There is no evidence of focal opacification, pleural effusion or pneumothorax. The cardiomediastinal silhouette is within normal limits. Bilateral metallic nipple piercings are seen.  The visualized bowel gas pattern is unremarkable. Scattered stool and air are seen within the colon; there is no evidence of small bowel dilatation to suggest obstruction. No free intra-abdominal air is identified on the provided upright view.  No acute osseous abnormalities are seen; the sacroiliac joints are unremarkable in appearance.  IMPRESSION: 1. Unremarkable bowel gas pattern; no free intra-abdominal air seen. 2. No acute cardiopulmonary process identified.   Electronically Signed   By: Garald Balding M.D.   On: 12/06/2013 02:21     EKG Interpretation None      MDM   Final diagnoses:  Other headache syndrome    Patient with malingering behavior, and chronic drug seeking behavior.  There is no evidence for acute neurologic abnormality.  Nursing Notes Reviewed/ Care Coordinated Applicable Imaging Reviewed Interpretation of Laboratory Data incorporated into ED treatment  The patient appears reasonably screened and/or stabilized for discharge  and I doubt any other medical condition or other [facility] requiring further screening, evaluation, or treatment in the ED at this time prior to discharge.  Plan: Home Medications- usual; Home Treatments- rest; return here if the recommended treatment, does not improve the symptoms; Recommended follow up- Neurology or PCP prn     Richarda Blade, MD 12/07/13 [phone]

## 2013-12-07 NOTE — Telephone Encounter (Addendum)
Spoke to patient. She will come in this morning for infusion. She will have a driver.

## 2013-12-07 NOTE — Telephone Encounter (Signed)
Pt called back regarding her headache. She stated that she has had the HA for 2 weeks and has gone to ER several times, was given compazine and benadryl, but not effective. She mentioned to them about Dr. Nikki Dom cocktail including benadryl, decadron and dilaudid, but ER would not give to her. She is calling to see whether she should still go to ER or she can come in for depakote infusion. I told her that Dr. Tish Frederickson has agreed about her coming in for depakote infusion. She should call office again in the early morning so that we can arrange for the infusion. She expressed understanding and appreciation.  Rosalin Hawking, MD PhD Stroke Neurology 12/07/2013 3:39 AM

## 2013-12-07 NOTE — Progress Notes (Addendum)
Pt and fiance, to treatment room explained plan.  Pt made comfortable in recliner. (offered blanket and water).  Fiance sitting next to her.   Under aseptic technique IV 24g angiocath inserted to R hand, unsuccessful and then to L hand with good results.  IV depacon 1000mg   Infusion started 1015.  Level 10.  Has had 4 days of R eye pain, migraine.  Elevated Bp 169/125.    HR 97.  O2 sat - 96.  HR 95.  Infusion complete after NS flush at 1045.  IV catheter removed.  Pressure and bandage applied.  Pt Level to 4.  VS 147/109 HR 97, then again 164/114 HR 93.  Pt NAD.  To go home and rest.  She had not taken her prppranolol this am.  I relayed that this would help with her Bp as well.  Encouraged to go home and take.   She stated she would.  Pt stated no allergies to depacon, and was not pregnant.

## 2013-12-07 NOTE — ED Notes (Signed)
EDP discussed pt condition & advised the pt that she was not getting any narcotics. Pt keeps on insisting that one of our doctors says this is the only thing that works for her headaches, she produced a old set of discharge papers that included diliauded as one of the medications. EDP informed pt that per her care plan that she was not to be given any narcotics for headaches. Pt got upset saying that's not right what if she came in for her MS pain or something else. EDP gave pt MD name that put the care plan in place & she would need to call & make appointment to discuss plan w/ him. Pt left the department w/ friend saying that they would be making a complaint.

## 2013-12-07 NOTE — Discharge Instructions (Signed)

## 2013-12-07 NOTE — Patient Instructions (Signed)
NAD.  Fiance with her.

## 2013-12-11 NOTE — ED Provider Notes (Signed)
CSN: [id number]     Arrival date & time 11/18/13  1207 History   None    Chief Complaint  Patient presents with  . Headache     (Consider location/radiation/quality/duration/timing/severity/associated sxs/prior Treatment) Patient is a 33 y.o. female presenting with headaches. The history is provided by the patient (pt complains of a headache).  Headache Pain location:  Generalized Quality:  Dull Radiates to:  Does not radiate Severity currently:  8/10 Severity at highest:  9/10 Onset quality:  Sudden Timing:  Constant Progression:  Worsening Chronicity:  Recurrent Associated symptoms: no abdominal pain, no back pain, no congestion, no cough, no diarrhea, no fatigue, no seizures and no sinus pressure     Past Medical History  Diagnosis Date  . Headache(784.0)     migraines  . Multiple sclerosis   . Asthma as a child  . GERD (gastroesophageal reflux disease)   . Urinary urgency   . Anxiety   . Perforated bowel 2009  . Migraines   . Depression   . Chronic back pain   . Chronic pain   . Ovarian cyst    Past Surgical History  Procedure Laterality Date  . Extremity cyst excision  1994    right leg  . 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 Colostomy closure  04/2007  . Scar revision  01/21/2011    Procedure: SCAR REVISION;  Surgeon: Hermelinda Dellen;  Location: [facility];  Service: Plastics;  Laterality: N/A;  exploration of scar of abdomen and repair of defect  . Hernia repair    . Abdominal surgery    . Appendectomy     Family History  Problem Relation Age of Onset  . Diabetes Mother   . Hypertension Mother   . Diabetes Father   . Hypertension Father    History  Substance Use Topics  . Smoking status: Current Some Day Smoker -- 0.40 packs/day for 10 years    Types: Cigarettes  . Smokeless tobacco: Never Used     Comment: quit smoking 10 days ago  . Alcohol Use: Yes     Comment: Rare   OB History    No data available     Review  of Systems  Constitutional: Negative for appetite change and fatigue.  HENT: Negative for congestion, ear discharge and sinus pressure.   Eyes: Negative for discharge.  Respiratory: Negative for cough.   Cardiovascular: Negative for chest pain.  Gastrointestinal: Negative for abdominal pain and diarrhea.  Genitourinary: Negative for frequency and hematuria.  Musculoskeletal: Negative for back pain.  Skin: Negative for rash.  Neurological: Positive for headaches. Negative for seizures.  Psychiatric/Behavioral: Negative for hallucinations.      Allergies  Amitriptyline; Baclofen; Cymbalta; Gabapentin; Monosodium glutamate; Vicodin; Alprazolam; Magnesium salicylate; Rizatriptan; Tizanidine; Toradol; Tramadol; Adhesive; and Lamotrigine  Home Medications   Prior to Admission medications   Medication Sig Start Date End Date Taking? Authorizing Provider  Alum & Mag Hydroxide-Simeth (MAGIC MOUTHWASH W/LIDOCAINE) SOLN Take 5 mLs by mouth 3 (three) times daily as needed (For reflux pain). 10/24/13   Tanna Furry, MD  carbamazepine (CARBATROL) 200 MG 12 hr capsule Take 200 mg by mouth 3 (three) times daily. 04/22/13 04/22/14  Historical Provider, MD  celecoxib (CELEBREX) 100 MG capsule Take 1 capsule (100 mg total) by mouth 2 (two) times daily. 11/01/13   Lenox Ahr, PA-C  chlorthalidone (HYGROTON) 25 MG tablet Take 1 tablet (25 mg total) by mouth daily. 11/24/13  Delora Fuel, MD  cyclobenzaprine (FLEXERIL) 10 MG tablet Take 10 mg by mouth 3 (three) times daily as needed for muscle spasms.    Historical Provider, MD  diphenhydrAMINE (BENADRYL) 25 MG tablet Take 50 mg by mouth every 8 (eight) hours as needed for sleep.     Historical Provider, MD  EPINEPHrine (EPIPEN) 0.3 mg/0.3 mL SOAJ injection Inject 0.3 mg into the muscle as needed (allergic reaction).     Historical Provider, MD  Fingolimod HCl (GILENYA) 0.5 MG CAPS Take 0.5 mg by mouth daily.     Historical Provider, MD  lactulose  (CHRONULAC) 10 GM/15ML solution Take 15 mLs (10 g total) by mouth 2 (two) times daily as needed for mild constipation or moderate constipation. 12/06/13   Richarda Blade, MD  loratadine (CLARITIN) 10 MG tablet Take 10 mg by mouth daily.     Historical Provider, MD  medroxyPROGESTERone (DEPO-PROVERA) 150 MG/ML injection Inject 150 mg into the muscle every 3 (three) months. Last injection mid September    Historical Provider, MD  omeprazole (PRILOSEC) 40 MG capsule Take 40 mg by mouth daily.     Historical Provider, MD  ondansetron (ZOFRAN) 8 MG tablet Take 8 mg by mouth every 6 (six) hours as needed for nausea or vomiting.     Historical Provider, MD  potassium chloride (K-DUR) 10 MEQ tablet Take 10 mEq by mouth 2 (two) times daily.    Historical Provider, MD  pregabalin (LYRICA) 200 MG capsule Take 600 mg by mouth at bedtime.    Historical Provider, MD  promethazine (PHENERGAN) 25 MG tablet Take 25 mg by mouth every 6 (six) hours as needed for nausea or vomiting.     Historical Provider, MD  propranolol (INDERAL) 60 MG tablet Take 1 tablet (60 mg total) by mouth 2 (two) times daily. 11/23/13   Penni Bombard, MD  ranitidine (ZANTAC) 300 MG capsule Take 300 mg by mouth at bedtime.     Historical Provider, MD  topiramate (TOPAMAX) 100 MG tablet Take 100 mg by mouth 2 (two) times daily.     Historical Provider, MD  venlafaxine (EFFEXOR) 75 MG tablet Take 75 mg by mouth 3 (three) times daily with meals.    Historical Provider, MD   BP 140/103 mmHg  Pulse 90  Temp(Src) 98.6 F (37 C) (Oral)  Resp 20  Ht 5\' 5"  (1.651 m)  Wt 218 lb (98.884 kg)  BMI 36.28 kg/m2  SpO2 100% Physical Exam  Constitutional: She is oriented to person, place, and time. She appears well-developed.  HENT:  Head: Normocephalic.  Eyes: Conjunctivae and EOM are normal. No scleral icterus.  Neck: Neck supple. No thyromegaly present.  Cardiovascular: Normal rate and regular rhythm.  Exam reveals no gallop and no friction  rub.   No murmur heard. Pulmonary/Chest: No stridor. She has no wheezes. She has no rales. She exhibits no tenderness.  Abdominal: She exhibits no distension. There is no tenderness. There is no rebound.  Musculoskeletal: Normal range of motion. She exhibits no edema.  Lymphadenopathy:    She has no cervical adenopathy.  Neurological: She is oriented to person, place, and time. She exhibits normal muscle tone. Coordination normal.  Skin: No rash noted. No erythema.  Psychiatric: She has a normal mood and affect. Her behavior is normal.    ED Course  Procedures (including critical care time) Labs Review Labs Reviewed - No data to display  Imaging Review No results found.   EKG Interpretation None  MDM   Final diagnoses:  None    Pt improved   Maudry Diego, MD 12/11/13 1539

## 2013-12-13 ENCOUNTER — Ambulatory Visit: Payer: Medicare Other | Admitting: Diagnostic Neuroimaging

## 2013-12-22 ENCOUNTER — Ambulatory Visit: Payer: Medicare Other | Admitting: Diagnostic Neuroimaging

## 2013-12-24 ENCOUNTER — Encounter: Payer: Self-pay | Admitting: Diagnostic Neuroimaging

## 2013-12-28 ENCOUNTER — Ambulatory Visit: Payer: Medicare Other | Admitting: Diagnostic Neuroimaging

## 2014-01-05 ENCOUNTER — Ambulatory Visit: Payer: Medicare Other | Admitting: Diagnostic Neuroimaging

## 2014-01-06 ENCOUNTER — Emergency Department (HOSPITAL_COMMUNITY)
Admission: EM | Admit: 2014-01-06 | Discharge: 2014-01-06 | Disposition: A | Discharge status: Home / Self Care | Payer: Medicare Other | Attending: Emergency Medicine | Admitting: Emergency Medicine

## 2014-01-06 ENCOUNTER — Encounter (HOSPITAL_COMMUNITY): Payer: Self-pay

## 2014-01-06 DIAGNOSIS — Z791 Long term (current) use of non-steroidal anti-inflammatories (NSAID): Secondary | ICD-10-CM | POA: Diagnosis not present

## 2014-01-06 DIAGNOSIS — W19XXXA Unspecified fall, initial encounter: Secondary | ICD-10-CM

## 2014-01-06 DIAGNOSIS — S199XXA Unspecified injury of neck, initial encounter: Secondary | ICD-10-CM | POA: Diagnosis present

## 2014-01-06 DIAGNOSIS — S139XXA Sprain of joints and ligaments of unspecified parts of neck, initial encounter: Secondary | ICD-10-CM

## 2014-01-06 DIAGNOSIS — R42 Dizziness and giddiness: Secondary | ICD-10-CM

## 2014-01-06 DIAGNOSIS — G4489 Other headache syndrome: Secondary | ICD-10-CM | POA: Insufficient documentation

## 2014-01-06 DIAGNOSIS — S134XXA Sprain of ligaments of cervical spine, initial encounter: Secondary | ICD-10-CM | POA: Insufficient documentation

## 2014-01-06 DIAGNOSIS — S0990XA Unspecified injury of head, initial encounter: Secondary | ICD-10-CM | POA: Diagnosis not present

## 2014-01-06 DIAGNOSIS — W1812XA Fall from or off toilet with subsequent striking against object, initial encounter: Secondary | ICD-10-CM | POA: Insufficient documentation

## 2014-01-06 DIAGNOSIS — Z720 Tobacco use: Secondary | ICD-10-CM | POA: Insufficient documentation

## 2014-01-06 DIAGNOSIS — Y9289 Other specified places as the place of occurrence of the external cause: Secondary | ICD-10-CM | POA: Insufficient documentation

## 2014-01-06 DIAGNOSIS — Y998 Other external cause status: Secondary | ICD-10-CM | POA: Diagnosis not present

## 2014-01-06 DIAGNOSIS — K219 Gastro-esophageal reflux disease without esophagitis: Secondary | ICD-10-CM | POA: Insufficient documentation

## 2014-01-06 DIAGNOSIS — G35 Multiple sclerosis: Secondary | ICD-10-CM | POA: Diagnosis not present

## 2014-01-06 DIAGNOSIS — S3991XA Unspecified injury of abdomen, initial encounter: Secondary | ICD-10-CM | POA: Insufficient documentation

## 2014-01-06 DIAGNOSIS — Y9389 Activity, other specified: Secondary | ICD-10-CM | POA: Diagnosis not present

## 2014-01-06 DIAGNOSIS — G8929 Other chronic pain: Secondary | ICD-10-CM | POA: Diagnosis not present

## 2014-01-06 DIAGNOSIS — F419 Anxiety disorder, unspecified: Secondary | ICD-10-CM | POA: Insufficient documentation

## 2014-01-06 DIAGNOSIS — J449 Chronic obstructive pulmonary disease, unspecified: Secondary | ICD-10-CM | POA: Diagnosis not present

## 2014-01-06 DIAGNOSIS — F329 Major depressive disorder, single episode, unspecified: Secondary | ICD-10-CM | POA: Insufficient documentation

## 2014-01-06 DIAGNOSIS — Z79899 Other long term (current) drug therapy: Secondary | ICD-10-CM | POA: Diagnosis not present

## 2014-01-06 NOTE — ED Provider Notes (Signed)
CSN: [id number]     Arrival date & time 01/06/14  0212 History   First MD Initiated Contact with Patient 01/06/14 0249     Chief Complaint  Patient presents with  . Fall    Patient is a 33 y.o. female presenting with fall. The history is provided by the patient and a caregiver.  Fall This is a new problem. The current episode started less than 1 hour ago. The problem has not changed since onset.Associated symptoms include abdominal pain and headaches. Pertinent negatives include no chest pain. Exacerbated by: movement. The symptoms are relieved by rest.  Patient presents after a fall She was on the toilet and attempting to wipe herself she felt dizzy while changing positions and she fell forward hitting her forehead.  She reports brief LOC.  No seizure activity reported.  She now reports headache and neck and left shoulder pain.  No new weakness.  No CP.   She had otherwise been at her baseline prior to fall   Past Medical History  Diagnosis Date  . Headache(784.0)     migraines  . Multiple sclerosis   . Asthma as a child  . GERD (gastroesophageal reflux disease)   . Urinary urgency   . Anxiety   . Perforated bowel 2009  . Migraines   . Depression   . Chronic back pain   . Chronic pain   . Ovarian cyst    Past Surgical History  Procedure Laterality Date  . Extremity cyst excision  1994    right leg  . 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 Colostomy closure  04/2007  . Scar revision  01/21/2011    Procedure: SCAR REVISION;  Surgeon: Hermelinda Dellen;  Location: [facility];  Service: Plastics;  Laterality: N/A;  exploration of scar of abdomen and repair of defect  . Hernia repair    . Abdominal surgery    . Appendectomy     Family History  Problem Relation Age of Onset  . Diabetes Mother   . Hypertension Mother   . Diabetes Father   . Hypertension Father    History  Substance Use Topics  . Smoking status: Current Some Day Smoker -- 0.40  packs/day for 10 years    Types: Cigarettes  . Smokeless tobacco: Never Used     Comment: quit smoking 10 days ago  . Alcohol Use: Yes     Comment: Rare   OB History    No data available     Review of Systems  Constitutional: Negative for fever.  HENT: Negative for facial swelling.   Cardiovascular: Negative for chest pain.  Gastrointestinal: Positive for abdominal pain. Negative for vomiting.       Chronic abdominal pain   Musculoskeletal: Positive for arthralgias and neck pain.  Neurological: Positive for headaches.  Psychiatric/Behavioral: The patient is nervous/anxious.   All other systems reviewed and are negative.     Allergies  Amitriptyline; Baclofen; Cymbalta; Gabapentin; Monosodium glutamate; Vicodin; Alprazolam; Magnesium salicylate; Rizatriptan; Tizanidine; Toradol; Tramadol; Adhesive; and Lamotrigine  Home Medications   Prior to Admission medications   Medication Sig Start Date End Date Taking? Authorizing Provider  Alum & Mag Hydroxide-Simeth (MAGIC MOUTHWASH W/LIDOCAINE) SOLN Take 5 mLs by mouth 3 (three) times daily as needed (For reflux pain). 10/24/13  Yes Tanna Furry, MD  carbamazepine (CARBATROL) 200 MG 12 hr capsule Take 200 mg by mouth 3 (three) times daily. 04/22/13 04/22/14 Yes Historical Provider, MD  celecoxib (CELEBREX) 100 MG capsule Take 1 capsule (100 mg total) by mouth 2 (two) times daily. 11/01/13  Yes Lenox Ahr, PA-C  chlorthalidone (HYGROTON) 25 MG tablet Take 1 tablet (25 mg total) by mouth daily. 67/20/94  Yes Delora Fuel, MD  cyclobenzaprine (FLEXERIL) 10 MG tablet Take 10 mg by mouth 3 (three) times daily as needed for muscle spasms.   Yes Historical Provider, MD  diphenhydrAMINE (BENADRYL) 25 MG tablet Take 50 mg by mouth every 8 (eight) hours as needed for sleep.    Yes Historical Provider, MD  EPINEPHrine (EPIPEN) 0.3 mg/0.3 mL SOAJ injection Inject 0.3 mg into the muscle as needed (allergic reaction).    Yes Historical Provider, MD   Fingolimod HCl (GILENYA) 0.5 MG CAPS Take 0.5 mg by mouth daily.    Yes Historical Provider, MD  lactulose (CHRONULAC) 10 GM/15ML solution Take 15 mLs (10 g total) by mouth 2 (two) times daily as needed for mild constipation or moderate constipation. 12/06/13  Yes Richarda Blade, MD  loratadine (CLARITIN) 10 MG tablet Take 10 mg by mouth daily.    Yes Historical Provider, MD  medroxyPROGESTERone (DEPO-PROVERA) 150 MG/ML injection Inject 150 mg into the muscle every 3 (three) months. Last injection mid September   Yes Historical Provider, MD  omeprazole (PRILOSEC) 40 MG capsule Take 40 mg by mouth daily.    Yes Historical Provider, MD  ondansetron (ZOFRAN) 8 MG tablet Take 8 mg by mouth every 6 (six) hours as needed for nausea or vomiting.    Yes Historical Provider, MD  potassium chloride (K-DUR) 10 MEQ tablet Take 10 mEq by mouth 2 (two) times daily.   Yes Historical Provider, MD  pregabalin (LYRICA) 200 MG capsule Take 600 mg by mouth at bedtime.   Yes Historical Provider, MD  promethazine (PHENERGAN) 25 MG tablet Take 25 mg by mouth every 6 (six) hours as needed for nausea or vomiting.    Yes Historical Provider, MD  propranolol (INDERAL) 60 MG tablet Take 1 tablet (60 mg total) by mouth 2 (two) times daily. 11/23/13  Yes Penni Bombard, MD  ranitidine (ZANTAC) 300 MG capsule Take 300 mg by mouth at bedtime.    Yes Historical Provider, MD  topiramate (TOPAMAX) 100 MG tablet Take 100 mg by mouth 2 (two) times daily.    Yes Historical Provider, MD  venlafaxine (EFFEXOR) 75 MG tablet Take 75 mg by mouth 3 (three) times daily with meals.   Yes Historical Provider, MD   BP 130/94 mmHg  Pulse 102  Temp(Src) 98.3 F (36.8 C) (Oral)  Resp 18  Ht 5\' 5"  (1.651 m)  Wt 200 lb (90.719 kg)  BMI 33.28 kg/m2  SpO2 100% Physical Exam CONSTITUTIONAL: Well developed/well nourished HEAD: Normocephalic/atraumatic, no evidence of trauma EYES: EOMI/PERRL ENMT: Mucous membranes moist, No evidence of  facial/nasal trauma No dental injury.  No septal hematoma NECK: supple no meningeal signs SPINE/BACK:left cervical paraspinal tenderness, No bruising/crepitance/stepoffs noted to spine CV: S1/S2 noted, no murmurs/rubs/gallops noted LUNGS: Lungs are clear to auscultation bilaterally, no apparent distress ABDOMEN: soft, nontender, no rebound or guarding, bowel sounds noted throughout abdomen GU:no cva tenderness NEURO: Pt is awake/alert/appropriate, moves all extremitiesx4.  No facial droop.   No arm/leg drift is noted.  Equal power with hand grips noted bilaterally EXTREMITIES: pulses normal/equal, full ROM, no deformity is noted SKIN: warm, color normal PSYCH: anxious  ED Course  Procedures  3:09 AM  Nurse present for entire history/exam Pt presents after fall from  toilet No signs of head injury.  She reports brief LOC but now at baseline, no mental status change and no vomitng.  I don't feel emergent CT head indicated.  For her neck pain, it is mostly paraspinal tenderness, spinal imaging not warranted.  Her EKG is at baseline She has long h/o migraines, suspect fall has triggered her HA     EKG Interpretation   Date/Time:  Thursday January 06 2014 03:02:51 EST Ventricular Rate:  91 PR Interval:  187 QRS Duration: 77 QT Interval:  346 QTC Calculation: 426 R Axis:   24 Text Interpretation:  Sinus rhythm Low voltage, precordial leads ECG  OTHERWISE WITHIN NORMAL LIMITS No significant change since last tracing  Confirmed by Christy Gentles  MD, Elenore Rota ([id number]) on 01/06/2014 3:07:27 AM      MDM   Final diagnoses:  Fall, initial encounter  Minor head injury, initial encounter  Other headache syndrome  Acute cervical sprain, initial encounter  Dizziness    Nursing notes including past medical history and social history reviewed and considered in documentation Previous records reviewed and considered Narcotic database reviewed and considered in decision making     Sharyon Cable, MD 01/06/14 [phone]

## 2014-01-06 NOTE — ED Notes (Signed)
Pt states she was in the bathroom and when she bent over to wipe herself she got dizzy and fell forward hitting her head on the side of the tub.  Pt states she thinks she got knocked out, having pain to head, neck and shoulders

## 2014-01-06 NOTE — Discharge Instructions (Signed)
You have had a head injury which does not appear to require admission at this time. A concussion is a state of changed mental ability from trauma. ° °SEEK IMMEDIATE MEDICAL ATTENTION IF: °There is confusion or drowsiness (although children frequently become drowsy after injury).  °You cannot awaken the injured person.  °There is nausea (feeling sick to your stomach) or continued, forceful vomiting.  °You notice dizziness or unsteadiness which is getting worse, or inability to walk.  °You have convulsions or unconsciousness.  °You experience severe, persistent headaches not relieved by Tylenol. (Do not take aspirin as this impairs clotting abilities). Take other pain medications only as directed.  °You cannot use arms or legs normally.  °There are changes in pupil sizes. (This is the black center in the colored part of the eye)  °There is clear or bloody discharge from the nose or ears.  °Change in speech, vision, swallowing, or understanding.  °Localized weakness, numbness, tingling, or change in bowel or bladder control. ° °You have neck pain, possibly from a cervical strain and/or pinched nerve.  ° °SEEK IMMEDIATE MEDICAL ATTENTION IF: °You develop difficulties swallowing or breathing.  °You have new or worse numbness, weakness, tingling, or movement problems in your arms or legs.  °You develop increasing pain which is uncontrolled with medications.  °You have change in bowel or bladder function, or other concerns. ° ° °

## 2014-01-11 ENCOUNTER — Ambulatory Visit: Payer: Medicare Other | Admitting: Diagnostic Neuroimaging

## 2014-01-19 ENCOUNTER — Encounter: Payer: Self-pay | Admitting: Diagnostic Neuroimaging

## 2014-01-25 ENCOUNTER — Encounter (HOSPITAL_COMMUNITY): Payer: Self-pay | Admitting: Emergency Medicine

## 2014-01-25 ENCOUNTER — Emergency Department (HOSPITAL_COMMUNITY)
Admission: EM | Admit: 2014-01-25 | Discharge: 2014-01-25 | Disposition: A | Discharge status: Home / Self Care | Payer: Medicare Other | Attending: Emergency Medicine | Admitting: Emergency Medicine

## 2014-01-25 ENCOUNTER — Emergency Department (HOSPITAL_COMMUNITY)
Admission: EM | Admit: 2014-01-25 | Discharge: 2014-01-25 | Disposition: A | Discharge status: Home / Self Care | Payer: Medicare Other | Source: Home / Self Care | Attending: Emergency Medicine | Admitting: Emergency Medicine

## 2014-01-25 ENCOUNTER — Encounter (HOSPITAL_COMMUNITY): Payer: Self-pay | Admitting: Neurology

## 2014-01-25 DIAGNOSIS — Z720 Tobacco use: Secondary | ICD-10-CM | POA: Insufficient documentation

## 2014-01-25 DIAGNOSIS — G35 Multiple sclerosis: Secondary | ICD-10-CM

## 2014-01-25 DIAGNOSIS — Z8742 Personal history of other diseases of the female genital tract: Secondary | ICD-10-CM | POA: Insufficient documentation

## 2014-01-25 DIAGNOSIS — G43909 Migraine, unspecified, not intractable, without status migrainosus: Secondary | ICD-10-CM | POA: Insufficient documentation

## 2014-01-25 DIAGNOSIS — F329 Major depressive disorder, single episode, unspecified: Secondary | ICD-10-CM

## 2014-01-25 DIAGNOSIS — F419 Anxiety disorder, unspecified: Secondary | ICD-10-CM

## 2014-01-25 DIAGNOSIS — R000 Tachycardia, unspecified: Secondary | ICD-10-CM | POA: Insufficient documentation

## 2014-01-25 DIAGNOSIS — Z79899 Other long term (current) drug therapy: Secondary | ICD-10-CM | POA: Insufficient documentation

## 2014-01-25 DIAGNOSIS — F410 Panic disorder [episodic paroxysmal anxiety] without agoraphobia: Secondary | ICD-10-CM | POA: Insufficient documentation

## 2014-01-25 DIAGNOSIS — G8929 Other chronic pain: Secondary | ICD-10-CM

## 2014-01-25 DIAGNOSIS — K219 Gastro-esophageal reflux disease without esophagitis: Secondary | ICD-10-CM

## 2014-01-25 DIAGNOSIS — J45909 Unspecified asthma, uncomplicated: Secondary | ICD-10-CM | POA: Insufficient documentation

## 2014-01-25 DIAGNOSIS — Z791 Long term (current) use of non-steroidal anti-inflammatories (NSAID): Secondary | ICD-10-CM | POA: Insufficient documentation

## 2014-01-25 DIAGNOSIS — Z8739 Personal history of other diseases of the musculoskeletal system and connective tissue: Secondary | ICD-10-CM | POA: Diagnosis not present

## 2014-01-25 HISTORY — DX: Panic disorder (episodic paroxysmal anxiety): F41.0

## 2014-01-25 MED ORDER — DIAZEPAM 5 MG PO TABS
5.0000 mg | ORAL_TABLET | Freq: Once | ORAL | Status: AC
  Administered 2014-01-25: 5 mg via ORAL
  Filled 2014-01-25: qty 1

## 2014-01-25 NOTE — ED Provider Notes (Signed)
CSN: [id number]     Arrival date & time 01/25/14  1245 History  This chart was scribed for non-physician practitioner, Alvina Chou, PA-C, working with [name], MD by Ladene Artist, ED Scribe. This patient was seen in room TR08C/TR08C and the patient's care was started at 1:56 PM.   Chief Complaint  Patient presents with  . Panic Attack   The history is provided by the patient. No language interpreter was used.   HPI Comments: Emily [name] is a 34 y.o. female, with a h/o multiple sclerosis, depression, anxiety, GERD, who presents to the Emergency Department complaining of a panic attack onset a few hours ago. Pt reports that she experienced a panic attack around 7 AM this morning and was seen at [facility]. Pt was given Valium and discharged with relief. She states that she experienced another panic attack approximately 2.5 hours after discharge while sitting at home. Pt reports "my entire body locked up including my face and hands." She further reports that she felt like she had a weight on top of her head. Pt states that her fiance's grandfather recently passed whom she has known for 7 months. They will attend the wake today and funeral tomorrow. Pt has tried taking prescribed Clonazepam without relief.  PCP: Ricke Hey, MD  Past Medical History  Diagnosis Date  . Headache(784.0)     migraines  . Multiple sclerosis   . Asthma as a child  . GERD (gastroesophageal reflux disease)   . Urinary urgency   . Anxiety   . Perforated bowel 2009  . Migraines   . Depression   . Chronic back pain   . Chronic pain   . Ovarian cyst   . Panic attack    Past Surgical History  Procedure Laterality Date  . Extremity cyst excision  1994    right leg  . 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 Colostomy closure  04/2007  . Scar revision  01/21/2011    Procedure: SCAR REVISION;  Surgeon: Hermelinda Dellen;  Location: [place];  Service: Plastics;   Laterality: N/A;  exploration of scar of abdomen and repair of defect  . Hernia repair    . Abdominal surgery    . Appendectomy     Family History  Problem Relation Age of Onset  . Diabetes Mother   . Hypertension Mother   . Diabetes Father   . Hypertension Father    History  Substance Use Topics  . Smoking status: Current Some Day Smoker -- 0.40 packs/day for 10 years    Types: Cigarettes  . Smokeless tobacco: Never Used     Comment: quit smoking 10 days ago  . Alcohol Use: Yes     Comment: Rare   OB History    No data available     Review of Systems  Psychiatric/Behavioral: The patient is nervous/anxious.   All other systems reviewed and are negative.  Allergies  Amitriptyline; Baclofen; Cymbalta; Gabapentin; Monosodium glutamate; Vicodin; Alprazolam; Magnesium salicylate; Rizatriptan; Tizanidine; Toradol; Tramadol; Adhesive; and Lamotrigine  Home Medications   Prior to Admission medications   Medication Sig Start Date End Date Taking? Authorizing Provider  Alum & Mag Hydroxide-Simeth (MAGIC MOUTHWASH W/LIDOCAINE) SOLN Take 5 mLs by mouth 3 (three) times daily as needed (For reflux pain). 10/24/13   Tanna Furry, MD  carbamazepine (CARBATROL) 200 MG 12 hr capsule Take 200 mg by mouth 3 (three) times daily. 04/22/13 04/22/14  Historical Provider,  MD  celecoxib (CELEBREX) 100 MG capsule Take 1 capsule (100 mg total) by mouth 2 (two) times daily. 11/01/13   Lenox Ahr, PA-C  chlorthalidone (HYGROTON) 25 MG tablet Take 1 tablet (25 mg total) by mouth daily. 95/62/13   Delora Fuel, MD  cyclobenzaprine (FLEXERIL) 10 MG tablet Take 10 mg by mouth 3 (three) times daily as needed for muscle spasms.    Historical Provider, MD  diphenhydrAMINE (BENADRYL) 25 MG tablet Take 50 mg by mouth every 8 (eight) hours as needed for sleep.     Historical Provider, MD  EPINEPHrine (EPIPEN) 0.3 mg/0.3 mL SOAJ injection Inject 0.3 mg into the muscle as needed (allergic reaction).     Historical  Provider, MD  Fingolimod HCl (GILENYA) 0.5 MG CAPS Take 0.5 mg by mouth daily.     Historical Provider, MD  lactulose (CHRONULAC) 10 GM/15ML solution Take 15 mLs (10 g total) by mouth 2 (two) times daily as needed for mild constipation or moderate constipation. 12/06/13   Richarda Blade, MD  loratadine (CLARITIN) 10 MG tablet Take 10 mg by mouth daily.     Historical Provider, MD  medroxyPROGESTERone (DEPO-PROVERA) 150 MG/ML injection Inject 150 mg into the muscle every 3 (three) months. Last injection mid September    Historical Provider, MD  omeprazole (PRILOSEC) 40 MG capsule Take 40 mg by mouth daily.     Historical Provider, MD  ondansetron (ZOFRAN) 8 MG tablet Take 8 mg by mouth every 6 (six) hours as needed for nausea or vomiting.     Historical Provider, MD  potassium chloride (K-DUR) 10 MEQ tablet Take 10 mEq by mouth 2 (two) times daily.    Historical Provider, MD  pregabalin (LYRICA) 200 MG capsule Take 600 mg by mouth at bedtime.    Historical Provider, MD  promethazine (PHENERGAN) 25 MG tablet Take 25 mg by mouth every 6 (six) hours as needed for nausea or vomiting.     Historical Provider, MD  propranolol (INDERAL) 60 MG tablet Take 1 tablet (60 mg total) by mouth 2 (two) times daily. 11/23/13   Penni Bombard, MD  ranitidine (ZANTAC) 300 MG capsule Take 300 mg by mouth at bedtime.     Historical Provider, MD  topiramate (TOPAMAX) 100 MG tablet Take 100 mg by mouth 2 (two) times daily.     Historical Provider, MD  venlafaxine (EFFEXOR) 75 MG tablet Take 75 mg by mouth 3 (three) times daily with meals.    Historical Provider, MD   Triage Vitals: 156/114 mmHg  Pulse 73  Temp(Src) 97.3 F (36.3 C) (Oral)  Resp 18  SpO2 100% Physical Exam  Constitutional: She is oriented to person, place, and time. She appears well-developed and well-nourished. No distress.  HENT:  Head: Normocephalic and atraumatic.  Eyes: Conjunctivae and EOM are normal.  Neck: Neck supple.   Cardiovascular: Normal rate, regular rhythm and normal heart sounds.   Pulmonary/Chest: Effort normal and breath sounds normal.  Abdominal: Soft. She exhibits no distension. There is no tenderness. There is no rebound.  Musculoskeletal: Normal range of motion.  Neurological: She is alert and oriented to person, place, and time. Coordination normal.  Skin: Skin is warm and dry.  Psychiatric: She has a normal mood and affect. Her behavior is normal.  Nursing note and vitals reviewed.  ED Course  Procedures (including critical care time) DIAGNOSTIC STUDIES: Oxygen Saturation is 100% on RA, normal by my interpretation.    COORDINATION OF CARE: 2:00 PM-Discussed treatment plan  which includes Clonazepam PRN and follow-up with PCP with pt at bedside and pt agreed to plan.   Labs Review Labs Reviewed - No data to display  Imaging Review No results found.   EKG Interpretation None      MDM   Final diagnoses:  Panic attack    Patient feeling better and will be discharged without further evaluation.   I personally performed the services described in this documentation, which was scribed in my presence. The recorded information has been reviewed and is accurate.    Alvina Chou, PA-C 01/27/14 2040  [name], MD 01/30/14 [phone]

## 2014-01-25 NOTE — ED Notes (Addendum)
Patient c/o panic attack that started this morning at 7. Per patient hx of panic attacks. Patient reports feeling very anxious, with tremor, and rapid heart beat. Patient also reports tingling and numbness over entire body, worse in hands and feet.

## 2014-01-25 NOTE — ED Provider Notes (Signed)
CSN: [id number]     Arrival date & time 01/25/14  0757 History  This chart was scribed for Emily Manifold, MD by Stephania Fragmin, ED Scribe. This patient was seen in room APA19/APA19 and the patient's care was started at 8:20 AM.    Chief Complaint  Patient presents with  . Panic Attack   The history is provided by the patient. No language interpreter was used.     HPI Comments: Emily [name] is a 34 y.o. female with a history of anxiety and panic attacks who presents to the Emergency Department complaining of a possible oncoming panic attack that began about 2 hours ago, when patient woke up this morning. Patient reports she woke up feeling anxious, with her heart pounding, and was unable to go back to sleep. She also complains of associated numbness in her hands and feet. This feels like a past panic attack where patient had to be treated in the ED for breathing symptoms. She normally takes Klonopin for panic attacks, but the one she took this morning provided no relief. She denies a history of thyroid problems.  Past Medical History  Diagnosis Date  . Headache(784.0)     migraines  . Multiple sclerosis   . Asthma as a child  . GERD (gastroesophageal reflux disease)   . Urinary urgency   . Anxiety   . Perforated bowel 2009  . Migraines   . Depression   . Chronic back pain   . Chronic pain   . Ovarian cyst   . Panic attack    Past Surgical History  Procedure Laterality Date  . Extremity cyst excision  1994    right leg  . 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 Colostomy closure  04/2007  . Scar revision  01/21/2011    Procedure: SCAR REVISION;  Surgeon: Hermelinda Dellen;  Location: [place];  Service: Plastics;  Laterality: N/A;  exploration of scar of abdomen and repair of defect  . Hernia repair    . Abdominal surgery    . Appendectomy     Family History  Problem Relation Age of Onset  . Diabetes Mother   . Hypertension Mother   . Diabetes Father    . Hypertension Father    History  Substance Use Topics  . Smoking status: Current Some Day Smoker -- 0.40 packs/day for 10 years    Types: Cigarettes  . Smokeless tobacco: Never Used     Comment: quit smoking 10 days ago  . Alcohol Use: Yes     Comment: Rare   OB History    No data available     Review of Systems  Cardiovascular: Positive for palpitations.  Neurological: Positive for numbness.  Psychiatric/Behavioral: Positive for sleep disturbance. The patient is nervous/anxious.   All other systems reviewed and are negative.     Allergies  Amitriptyline; Baclofen; Cymbalta; Gabapentin; Monosodium glutamate; Vicodin; Alprazolam; Magnesium salicylate; Rizatriptan; Tizanidine; Toradol; Tramadol; Adhesive; and Lamotrigine  Home Medications   Prior to Admission medications   Medication Sig Start Date End Date Taking? Authorizing Provider  Alum & Mag Hydroxide-Simeth (MAGIC MOUTHWASH W/LIDOCAINE) SOLN Take 5 mLs by mouth 3 (three) times daily as needed (For reflux pain). 10/24/13   Tanna Furry, MD  carbamazepine (CARBATROL) 200 MG 12 hr capsule Take 200 mg by mouth 3 (three) times daily. 04/22/13 04/22/14  Historical Provider, MD  celecoxib (CELEBREX) 100 MG capsule Take 1 capsule (100 mg total) by mouth  2 (two) times daily. 11/01/13   Lenox Ahr, PA-C  chlorthalidone (HYGROTON) 25 MG tablet Take 1 tablet (25 mg total) by mouth daily. 87/56/43   Delora Fuel, MD  cyclobenzaprine (FLEXERIL) 10 MG tablet Take 10 mg by mouth 3 (three) times daily as needed for muscle spasms.    Historical Provider, MD  diphenhydrAMINE (BENADRYL) 25 MG tablet Take 50 mg by mouth every 8 (eight) hours as needed for sleep.     Historical Provider, MD  EPINEPHrine (EPIPEN) 0.3 mg/0.3 mL SOAJ injection Inject 0.3 mg into the muscle as needed (allergic reaction).     Historical Provider, MD  Fingolimod HCl (GILENYA) 0.5 MG CAPS Take 0.5 mg by mouth daily.     Historical Provider, MD  lactulose  (CHRONULAC) 10 GM/15ML solution Take 15 mLs (10 g total) by mouth 2 (two) times daily as needed for mild constipation or moderate constipation. 12/06/13   Richarda Blade, MD  loratadine (CLARITIN) 10 MG tablet Take 10 mg by mouth daily.     Historical Provider, MD  medroxyPROGESTERone (DEPO-PROVERA) 150 MG/ML injection Inject 150 mg into the muscle every 3 (three) months. Last injection mid September    Historical Provider, MD  omeprazole (PRILOSEC) 40 MG capsule Take 40 mg by mouth daily.     Historical Provider, MD  ondansetron (ZOFRAN) 8 MG tablet Take 8 mg by mouth every 6 (six) hours as needed for nausea or vomiting.     Historical Provider, MD  potassium chloride (K-DUR) 10 MEQ tablet Take 10 mEq by mouth 2 (two) times daily.    Historical Provider, MD  pregabalin (LYRICA) 200 MG capsule Take 600 mg by mouth at bedtime.    Historical Provider, MD  promethazine (PHENERGAN) 25 MG tablet Take 25 mg by mouth every 6 (six) hours as needed for nausea or vomiting.     Historical Provider, MD  propranolol (INDERAL) 60 MG tablet Take 1 tablet (60 mg total) by mouth 2 (two) times daily. 11/23/13   Penni Bombard, MD  ranitidine (ZANTAC) 300 MG capsule Take 300 mg by mouth at bedtime.     Historical Provider, MD  topiramate (TOPAMAX) 100 MG tablet Take 100 mg by mouth 2 (two) times daily.     Historical Provider, MD  venlafaxine (EFFEXOR) 75 MG tablet Take 75 mg by mouth 3 (three) times daily with meals.    Historical Provider, MD   BP 164/112 mmHg  Pulse 123  Temp(Src) 97.9 F (36.6 C) (Oral)  Resp 24  Ht 5\' 5"  (1.651 m)  Wt 220 lb (99.791 kg)  BMI 36.61 kg/m2 Physical Exam  Constitutional: She is oriented to person, place, and time. She appears well-developed and well-nourished. No distress.  HENT:  Head: Normocephalic and atraumatic.  Eyes: Conjunctivae and EOM are normal.  Neck: Neck supple. No tracheal deviation present.  Cardiovascular: Tachycardia present.   Pulmonary/Chest: Effort  normal. No respiratory distress.  Musculoskeletal: Normal range of motion.  Neurological: She is alert and oriented to person, place, and time.  Skin: Skin is warm and dry.  Psychiatric: Her behavior is normal.  Anxious-appearing.  Nursing note and vitals reviewed.   ED Course  Procedures (including critical care time)  DIAGNOSTIC STUDIES:   COORDINATION OF CARE: 8:24 AM - Discussed treatment plan with pt at bedside which includes medication and pt agreed to plan.   Labs Review Labs Reviewed - No data to display  Imaging Review No results found.   EKG Interpretation None  MDM   Final diagnoses:  Anxiety     I personally preformed the services scribed in my presence. The recorded information has been reviewed is accurate. Emily Manifold, MD.     Emily Manifold, MD 01/26/14 [phone]

## 2014-01-25 NOTE — ED Notes (Signed)
Pt c/o "a panic attack.Emily [name] been locked up (pointing to her jaws)" Fiance states he has never seen her with her jaws locked and hands cramped. States this episode started about 2.5 hrs after getting Valium at [facility] this am.

## 2014-01-25 NOTE — Discharge Instructions (Signed)
Panic Attacks °Panic attacks are sudden, short feelings of great fear or discomfort. You may have them for no reason when you are relaxed, when you are uneasy (anxious), or when you are sleeping.  °HOME CARE °· Take all your medicines as told. °· Check with your doctor before starting new medicines. °· Keep all doctor visits. °GET HELP IF: °· You are not able to take your medicines as told. °· Your symptoms do not get better. °· Your symptoms get worse. °GET HELP RIGHT AWAY IF: °· Your attacks seem different than your normal attacks. °· You have thoughts about hurting yourself or others. °· You take panic attack medicine and you have a side effect. °MAKE SURE YOU: °· Understand these instructions. °· Will watch your condition. °· Will get help right away if you are not doing well or get worse. °Document Released: 01/26/2010 Document Revised: 10/14/2012 Document Reviewed: 08/07/2012 °[employer]® Patient Information ©2015 [employer]. This information is not intended to replace advice given to you by your health care provider. Make sure you discuss any questions you have with your health care provider. ° °

## 2014-01-25 NOTE — ED Notes (Signed)
Pt reports panic attack today while sitting at home, had hx of same this morning and went to [facility] given Valium. Reports anxiety, doesn't know what brought it on today.

## 2014-01-25 NOTE — Discharge Instructions (Signed)
Take your clonazepam as needed. Refer to attached documents for more information. Follow up with your doctor for further evaluation.

## 2014-01-26 ENCOUNTER — Ambulatory Visit: Payer: Medicare Other | Admitting: Diagnostic Neuroimaging

## 2014-01-31 ENCOUNTER — Emergency Department (HOSPITAL_COMMUNITY)
Admission: EM | Admit: 2014-01-31 | Discharge: 2014-01-31 | Discharge status: Against Medical Advice | Payer: Medicare Other | Attending: Emergency Medicine | Admitting: Emergency Medicine

## 2014-01-31 ENCOUNTER — Encounter (HOSPITAL_COMMUNITY): Payer: Self-pay | Admitting: *Deleted

## 2014-01-31 DIAGNOSIS — F419 Anxiety disorder, unspecified: Secondary | ICD-10-CM | POA: Diagnosis not present

## 2014-01-31 DIAGNOSIS — J45909 Unspecified asthma, uncomplicated: Secondary | ICD-10-CM | POA: Diagnosis not present

## 2014-01-31 DIAGNOSIS — Z720 Tobacco use: Secondary | ICD-10-CM | POA: Insufficient documentation

## 2014-01-31 DIAGNOSIS — G8929 Other chronic pain: Secondary | ICD-10-CM | POA: Diagnosis not present

## 2014-01-31 NOTE — ED Notes (Signed)
Pt decided to leave 

## 2014-01-31 NOTE — ED Notes (Addendum)
Says she is having a panic attack, tearful. Says she went to [facility] and said that they "can't help her"  Denies SI or HI

## 2014-02-02 ENCOUNTER — Ambulatory Visit: Payer: Medicare Other | Admitting: Diagnostic Neuroimaging

## 2014-02-03 ENCOUNTER — Encounter (HOSPITAL_COMMUNITY): Payer: Self-pay | Admitting: Emergency Medicine

## 2014-02-03 ENCOUNTER — Emergency Department (HOSPITAL_COMMUNITY)
Admission: EM | Admit: 2014-02-03 | Discharge: 2014-02-03 | Disposition: A | Discharge status: Home / Self Care | Payer: Medicare Other | Attending: Emergency Medicine | Admitting: Emergency Medicine

## 2014-02-03 DIAGNOSIS — G43909 Migraine, unspecified, not intractable, without status migrainosus: Secondary | ICD-10-CM | POA: Insufficient documentation

## 2014-02-03 DIAGNOSIS — F1110 Opioid abuse, uncomplicated: Secondary | ICD-10-CM | POA: Diagnosis not present

## 2014-02-03 DIAGNOSIS — F1310 Sedative, hypnotic or anxiolytic abuse, uncomplicated: Secondary | ICD-10-CM | POA: Diagnosis not present

## 2014-02-03 DIAGNOSIS — F329 Major depressive disorder, single episode, unspecified: Secondary | ICD-10-CM | POA: Insufficient documentation

## 2014-02-03 DIAGNOSIS — Z8742 Personal history of other diseases of the female genital tract: Secondary | ICD-10-CM | POA: Insufficient documentation

## 2014-02-03 DIAGNOSIS — Z791 Long term (current) use of non-steroidal anti-inflammatories (NSAID): Secondary | ICD-10-CM | POA: Diagnosis not present

## 2014-02-03 DIAGNOSIS — F1210 Cannabis abuse, uncomplicated: Secondary | ICD-10-CM | POA: Insufficient documentation

## 2014-02-03 DIAGNOSIS — Z3202 Encounter for pregnancy test, result negative: Secondary | ICD-10-CM | POA: Diagnosis not present

## 2014-02-03 DIAGNOSIS — Z79899 Other long term (current) drug therapy: Secondary | ICD-10-CM | POA: Insufficient documentation

## 2014-02-03 DIAGNOSIS — Z720 Tobacco use: Secondary | ICD-10-CM | POA: Diagnosis not present

## 2014-02-03 DIAGNOSIS — J45909 Unspecified asthma, uncomplicated: Secondary | ICD-10-CM | POA: Diagnosis not present

## 2014-02-03 DIAGNOSIS — G35 Multiple sclerosis: Secondary | ICD-10-CM | POA: Diagnosis not present

## 2014-02-03 DIAGNOSIS — G8929 Other chronic pain: Secondary | ICD-10-CM | POA: Insufficient documentation

## 2014-02-03 DIAGNOSIS — K219 Gastro-esophageal reflux disease without esophagitis: Secondary | ICD-10-CM | POA: Insufficient documentation

## 2014-02-03 DIAGNOSIS — F419 Anxiety disorder, unspecified: Secondary | ICD-10-CM

## 2014-02-03 DIAGNOSIS — R42 Dizziness and giddiness: Secondary | ICD-10-CM | POA: Diagnosis present

## 2014-02-03 LAB — BASIC METABOLIC PANEL
Anion gap: 5 (ref 5–15)
BUN: 10 mg/dL (ref 6–23)
CO2: 27 mmol/L (ref 19–32)
Calcium: 8.9 mg/dL (ref 8.4–10.5)
Chloride: 107 mmol/L (ref 96–112)
Creatinine, Ser: 0.75 mg/dL (ref 0.50–1.10)
GFR calc Af Amer: >90 mL/min (ref >90)
GFR calc non Af Amer: >90 mL/min (ref >90)
Glucose, Bld: 81 mg/dL (ref 70–99)
Potassium: 3.9 mmol/L (ref 3.5–5.1)
Sodium: 139 mmol/L (ref 135–145)

## 2014-02-03 LAB — CBC WITH DIFFERENTIAL/PLATELET
Basophils Absolute: 0 10*3/uL (ref 0.0–0.1)
Basophils Relative: 0 % (ref 0–1)
Eosinophils Absolute: 0.2 10*3/uL (ref 0.0–0.7)
Eosinophils Relative: 2 % (ref 0–5)
HCT: 45.5 % (ref 36.0–46.0)
Hemoglobin: 15 g/dL (ref 12.0–15.0)
Lymphocytes Relative: 23 % (ref 12–46)
Lymphs Abs: 2.2 10*3/uL (ref 0.7–4.0)
MCH: 30.7 pg (ref 26.0–34.0)
MCHC: 33 g/dL (ref 30.0–36.0)
MCV: 93.2 fL (ref 78.0–100.0)
Monocytes Absolute: 0.5 10*3/uL (ref 0.1–1.0)
Monocytes Relative: 5 % (ref 3–12)
Neutro Abs: 6.9 10*3/uL (ref 1.7–7.7)
Neutrophils Relative %: 70 % (ref 43–77)
Platelets: 320 10*3/uL (ref 150–400)
RBC: 4.88 MIL/uL (ref 3.87–5.11)
RDW: 12.6 % (ref 11.5–15.5)
WBC: 9.8 10*3/uL (ref 4.0–10.5)

## 2014-02-03 LAB — RAPID URINE DRUG SCREEN, HOSP PERFORMED
Amphetamines: NOT DETECTED
Barbiturates: NOT DETECTED
Benzodiazepines: POSITIVE — AB
Cocaine: NOT DETECTED
Opiates: POSITIVE — AB
Tetrahydrocannabinol: POSITIVE — AB

## 2014-02-03 LAB — URINALYSIS, ROUTINE W REFLEX MICROSCOPIC
Bilirubin Urine: NEGATIVE
Glucose, UA: NEGATIVE mg/dL
Ketones, ur: NEGATIVE mg/dL
Leukocytes, UA: NEGATIVE
Nitrite: NEGATIVE
Protein, ur: NEGATIVE mg/dL
Specific Gravity, Urine: 1.02 (ref 1.005–1.030)
Urobilinogen, UA: 0.2 mg/dL (ref 0.0–1.0)
pH: 7 (ref 5.0–8.0)

## 2014-02-03 LAB — URINE MICROSCOPIC-ADD ON

## 2014-02-03 LAB — PREGNANCY, URINE: Preg Test, Ur: NEGATIVE

## 2014-02-03 MED ORDER — SODIUM CHLORIDE 0.9 % IV BOLUS (SEPSIS)
1000.0000 mL | Freq: Once | INTRAVENOUS | Status: AC
  Administered 2014-02-03: 1000 mL via INTRAVENOUS

## 2014-02-03 MED ORDER — DIAZEPAM 5 MG PO TABS
5.0000 mg | ORAL_TABLET | Freq: Once | ORAL | Status: AC
  Administered 2014-02-03: 5 mg via ORAL
  Filled 2014-02-03: qty 1

## 2014-02-03 NOTE — ED Notes (Signed)
Lab at bedside attempting to get blood work. Will attempt IV when they are finished.

## 2014-02-03 NOTE — Discharge Instructions (Signed)

## 2014-02-03 NOTE — ED Notes (Signed)
Patient drowsy. Snoring. Responds to verbal stimuli.

## 2014-02-03 NOTE — ED Notes (Signed)
Having panic attack and dizziness.  Denies pain.

## 2014-02-03 NOTE — ED Notes (Signed)
Patient with no complaints at this time. Respirations even and unlabored. Skin warm/dry. Discharge instructions reviewed with patient at this time. Patient given opportunity to voice concerns/ask questions. IV removed per policy and band-aid applied to site. Patient discharged at this time and left Emergency Department with steady gait.  

## 2014-02-03 NOTE — ED Provider Notes (Signed)
CSN: [id number]     Arrival date & time 02/03/14  0904 History  This chart was scribed for Emily Manifold, MD by Einar Pheasant, ED Scribe. This patient was seen in room APA19/APA19 and the patient's care was started at 10:17 AM.    Chief Complaint  Patient presents with  . Dizziness  . Panic Attack   Patient is a 34 y.o. female presenting with dizziness. The history is provided by the patient and medical records. No language interpreter was used.  Dizziness Quality:  Room spinning Severity:  Moderate Onset quality:  Sudden Duration: after waking. Progression:  Unchanged Chronicity:  New Context: standing up   Relieved by:  Nothing Worsened by:  Nothing tried Ineffective treatments:  Closing eyes Associated symptoms: no chest pain, no diarrhea, no headaches, no nausea and no vomiting   Risk factors: no hx of stroke, no hx of vertigo and no new medications    HPI Comments: Emily [name] is a 35 y.o. female with a PMhx of multiple sclerosis, depression, anxiety, and GERD listed below presents to the Emergency Department complaining of a panic attack that have been persistent for the past 2 weeks. Pt was recently seen at [facility] ED on 01/25/2014 for similar chief complaint. Pt prescribed Clonazepam PRN and advised to follow up with PCP. Now she is also complaining of dizziness which she describes as "seeing the room spinning". Dizziness started this AM after waking. She states that she is unable to walk with normal balance. Endorses having several falls at home prior to arrival.  Pt states that she feels like she is going to pass out right now just sitting in the bed. Positive back pain and generalized myalgias secondary to the falls. She endorses taking 1 mg of Clonazepam. Her main concern is her anxiety, she has had several attacks and states that her prescribed Clonazepam has not been effective. Pt denies fever, neck pain, sore throat, visual disturbance, CP, cough, SOB, abdominal pain, nausea,  emesis, diarrhea, urinary symptoms, back pain, HA, weakness, numbness and rash as associated symptoms.     Past Medical History  Diagnosis Date  . Headache(784.0)     migraines  . Multiple sclerosis   . Asthma as a child  . GERD (gastroesophageal reflux disease)   . Urinary urgency   . Anxiety   . Perforated bowel 2009  . Migraines   . Depression   . Chronic back pain   . Chronic pain   . Ovarian cyst   . Panic attack    Past Surgical History  Procedure Laterality Date  . Extremity cyst excision  1994    right leg  . 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 Colostomy closure  04/2007  . Scar revision  01/21/2011    Procedure: SCAR REVISION;  Surgeon: Hermelinda Dellen;  Location: [facility];  Service: Plastics;  Laterality: N/A;  exploration of scar of abdomen and repair of defect  . Hernia repair    . Abdominal surgery    . Appendectomy     Family History  Problem Relation Age of Onset  . Diabetes Mother   . Hypertension Mother   . Diabetes Father   . Hypertension Father    History  Substance Use Topics  . Smoking status: Current Some Day Smoker -- 0.40 packs/day for 10 years    Types: Cigarettes  . Smokeless tobacco: Never Used     Comment: quit smoking 10 days ago  .  Alcohol Use: Yes     Comment: Rare   OB History    No data available     Review of Systems  Constitutional: Negative for appetite change and fatigue.  HENT: Negative for congestion, ear discharge and sinus pressure.   Eyes: Negative for discharge.  Respiratory: Negative for cough.   Cardiovascular: Negative for chest pain.  Gastrointestinal: Negative for nausea, vomiting, abdominal pain and diarrhea.  Genitourinary: Negative for frequency and hematuria.  Musculoskeletal: Negative for back pain.  Skin: Negative for rash.  Neurological: Positive for dizziness. Negative for seizures and headaches.  Psychiatric/Behavioral: Negative for hallucinations. The patient is  nervous/anxious.   All other systems reviewed and are negative.     Allergies  Amitriptyline; Baclofen; Cymbalta; Gabapentin; Monosodium glutamate; Vicodin; Alprazolam; Magnesium salicylate; Rizatriptan; Tizanidine; Toradol; Tramadol; Adhesive; and Lamotrigine  Home Medications   Prior to Admission medications   Medication Sig Start Date End Date Taking? Authorizing Provider  Alum & Mag Hydroxide-Simeth (MAGIC MOUTHWASH W/LIDOCAINE) SOLN Take 5 mLs by mouth 3 (three) times daily as needed (For reflux pain). 10/24/13   Tanna Furry, MD  carbamazepine (CARBATROL) 200 MG 12 hr capsule Take 200 mg by mouth 3 (three) times daily. 04/22/13 04/22/14  Historical Provider, MD  celecoxib (CELEBREX) 100 MG capsule Take 1 capsule (100 mg total) by mouth 2 (two) times daily. 11/01/13   Lenox Ahr, PA-C  chlorthalidone (HYGROTON) 25 MG tablet Take 1 tablet (25 mg total) by mouth daily. 75/10/25   Delora Fuel, MD  cyclobenzaprine (FLEXERIL) 10 MG tablet Take 10 mg by mouth 3 (three) times daily as needed for muscle spasms.    Historical Provider, MD  diphenhydrAMINE (BENADRYL) 25 MG tablet Take 50 mg by mouth every 8 (eight) hours as needed for sleep.     Historical Provider, MD  EPINEPHrine (EPIPEN) 0.3 mg/0.3 mL SOAJ injection Inject 0.3 mg into the muscle as needed (allergic reaction).     Historical Provider, MD  Fingolimod HCl (GILENYA) 0.5 MG CAPS Take 0.5 mg by mouth daily.     Historical Provider, MD  lactulose (CHRONULAC) 10 GM/15ML solution Take 15 mLs (10 g total) by mouth 2 (two) times daily as needed for mild constipation or moderate constipation. 12/06/13   Richarda Blade, MD  loratadine (CLARITIN) 10 MG tablet Take 10 mg by mouth daily.     Historical Provider, MD  medroxyPROGESTERone (DEPO-PROVERA) 150 MG/ML injection Inject 150 mg into the muscle every 3 (three) months. Last injection mid September    Historical Provider, MD  omeprazole (PRILOSEC) 40 MG capsule Take 40 mg by mouth daily.      Historical Provider, MD  ondansetron (ZOFRAN) 8 MG tablet Take 8 mg by mouth every 6 (six) hours as needed for nausea or vomiting.     Historical Provider, MD  potassium chloride (K-DUR) 10 MEQ tablet Take 10 mEq by mouth 2 (two) times daily.    Historical Provider, MD  pregabalin (LYRICA) 200 MG capsule Take 600 mg by mouth at bedtime.    Historical Provider, MD  promethazine (PHENERGAN) 25 MG tablet Take 25 mg by mouth every 6 (six) hours as needed for nausea or vomiting.     Historical Provider, MD  propranolol (INDERAL) 60 MG tablet Take 1 tablet (60 mg total) by mouth 2 (two) times daily. 11/23/13   Penni Bombard, MD  ranitidine (ZANTAC) 300 MG capsule Take 300 mg by mouth at bedtime.     Historical Provider, MD  topiramate (TOPAMAX)  100 MG tablet Take 100 mg by mouth 2 (two) times daily.     Historical Provider, MD  venlafaxine (EFFEXOR) 75 MG tablet Take 75 mg by mouth 3 (three) times daily with meals.    Historical Provider, MD   BP 161/124 mmHg  Pulse 118  Temp(Src) 98.3 F (36.8 C)  Resp 18  Ht 5\' 5"  (1.651 m)  Wt 220 lb (99.791 kg)  BMI 36.61 kg/m2  SpO2 100%  LMP 02/01/2007   Physical Exam  Constitutional: She appears well-developed and well-nourished. No distress.  Speech is slow, somewhat slurred but understandable.  HENT:  Head: Normocephalic and atraumatic.  Eyes: Conjunctivae are normal. Right eye exhibits no discharge. Left eye exhibits no discharge.  Neck: Neck supple.  Cardiovascular: Normal rate, regular rhythm and normal heart sounds.  Exam reveals no gallop and no friction rub.   No murmur heard. Pulmonary/Chest: Effort normal and breath sounds normal. No respiratory distress.  Abdominal: Soft. She exhibits no distension. There is no tenderness.  Musculoskeletal: She exhibits no edema or tenderness.  Neurological: She is alert.   Cannot stand unassisted because of tranquil ataxia.   Skin: Skin is warm and dry.  Psychiatric: Thought content normal.   Nursing note and vitals reviewed.   ED Course  Procedures (including critical care time)  DIAGNOSTIC STUDIES: Oxygen Saturation is 100% on RA, normal by my interpretation.    COORDINATION OF CARE: 10:22 AM- Pt advised of plan for treatment and pt agrees.  Labs Review Labs Reviewed  URINALYSIS, ROUTINE W REFLEX MICROSCOPIC - Abnormal; Notable for the following:    Hgb urine dipstick TRACE (*)    All other components within normal limits  URINE RAPID DRUG SCREEN (HOSP PERFORMED) - Abnormal; Notable for the following:    Opiates POSITIVE (*)    Benzodiazepines POSITIVE (*)    Tetrahydrocannabinol POSITIVE (*)    All other components within normal limits  URINE MICROSCOPIC-ADD ON - Abnormal; Notable for the following:    Squamous Epithelial / LPF FEW (*)    All other components within normal limits  CBC WITH DIFFERENTIAL/PLATELET  BASIC METABOLIC PANEL  PREGNANCY, URINE    Imaging Review No results found.   EKG Interpretation   Date/Time:  Thursday February 03 2014 11:08:43 EST Ventricular Rate:  103 PR Interval:  159 QRS Duration: 82 QT Interval:  333 QTC Calculation: 436 R Axis:   70 Text Interpretation:  Sinus tachycardia Low voltage, precordial leads  Baseline wander in lead(s) II III V3 since last tracing no significant  change Confirmed by Kenedy ([id number]) on 02/03/2014 1:50:59 PM      MDM   Final diagnoses:  Anxiety    33yF with what she calls anxiety. Multiple recent evaluations for the same. Very drowsy on presentation, now improved. Can ambulate unassisted now. No further complaints. Low suspicion for emergent process.   I personally preformed the services scribed in my presence. The recorded information has been reviewed is accurate. Emily Manifold, MD.    Emily Manifold, MD 02/08/14 [phone]

## 2014-02-03 NOTE — ED Notes (Signed)
MD at bedside. 

## 2014-02-05 ENCOUNTER — Encounter (HOSPITAL_BASED_OUTPATIENT_CLINIC_OR_DEPARTMENT_OTHER): Payer: Self-pay

## 2014-02-05 ENCOUNTER — Emergency Department (HOSPITAL_BASED_OUTPATIENT_CLINIC_OR_DEPARTMENT_OTHER): Payer: Medicare Other

## 2014-02-05 ENCOUNTER — Emergency Department (HOSPITAL_BASED_OUTPATIENT_CLINIC_OR_DEPARTMENT_OTHER)
Admission: EM | Admit: 2014-02-05 | Discharge: 2014-02-05 | Disposition: A | Discharge status: Home / Self Care | Payer: Medicare Other | Attending: Emergency Medicine | Admitting: Emergency Medicine

## 2014-02-05 DIAGNOSIS — F329 Major depressive disorder, single episode, unspecified: Secondary | ICD-10-CM | POA: Diagnosis not present

## 2014-02-05 DIAGNOSIS — Z3202 Encounter for pregnancy test, result negative: Secondary | ICD-10-CM | POA: Diagnosis not present

## 2014-02-05 DIAGNOSIS — Z8679 Personal history of other diseases of the circulatory system: Secondary | ICD-10-CM | POA: Diagnosis not present

## 2014-02-05 DIAGNOSIS — Z79899 Other long term (current) drug therapy: Secondary | ICD-10-CM | POA: Insufficient documentation

## 2014-02-05 DIAGNOSIS — J45909 Unspecified asthma, uncomplicated: Secondary | ICD-10-CM | POA: Diagnosis not present

## 2014-02-05 DIAGNOSIS — Z8742 Personal history of other diseases of the female genital tract: Secondary | ICD-10-CM | POA: Diagnosis not present

## 2014-02-05 DIAGNOSIS — G8929 Other chronic pain: Secondary | ICD-10-CM | POA: Diagnosis not present

## 2014-02-05 DIAGNOSIS — K219 Gastro-esophageal reflux disease without esophagitis: Secondary | ICD-10-CM | POA: Insufficient documentation

## 2014-02-05 DIAGNOSIS — F419 Anxiety disorder, unspecified: Secondary | ICD-10-CM | POA: Insufficient documentation

## 2014-02-05 LAB — CBC WITH DIFFERENTIAL/PLATELET
BASOS ABS: 0 10*3/uL (ref 0.0–0.1)
BASOS PCT: 0 % (ref 0–1)
EOS ABS: 0.2 10*3/uL (ref 0.0–0.7)
EOS PCT: 2 % (ref 0–5)
HCT: 40.8 % (ref 36.0–46.0)
Hemoglobin: 13.4 g/dL (ref 12.0–15.0)
Lymphocytes Relative: 25 % (ref 12–46)
Lymphs Abs: 2.2 10*3/uL (ref 0.7–4.0)
MCH: 30.1 pg (ref 26.0–34.0)
MCHC: 32.8 g/dL (ref 30.0–36.0)
MCV: 91.7 fL (ref 78.0–100.0)
MONO ABS: 0.6 10*3/uL (ref 0.1–1.0)
MONOS PCT: 7 % (ref 3–12)
NEUTROS PCT: 66 % (ref 43–77)
Neutro Abs: 5.7 10*3/uL (ref 1.7–7.7)
Platelets: 312 10*3/uL (ref 150–400)
RBC: 4.45 MIL/uL (ref 3.87–5.11)
RDW: 12.5 % (ref 11.5–15.5)
WBC: 8.7 10*3/uL (ref 4.0–10.5)

## 2014-02-05 LAB — BASIC METABOLIC PANEL
ANION GAP: 3 — AB (ref 5–15)
BUN: 10 mg/dL (ref 6–23)
CHLORIDE: 110 mmol/L (ref 96–112)
CO2: 23 mmol/L (ref 19–32)
Calcium: 8.2 mg/dL — ABNORMAL LOW (ref 8.4–10.5)
Creatinine, Ser: 0.66 mg/dL (ref 0.50–1.10)
GFR calc Af Amer: >90 mL/min (ref >90)
Glucose, Bld: 107 mg/dL — ABNORMAL HIGH (ref 70–99)
Potassium: 3.5 mmol/L (ref 3.5–5.1)
Sodium: 136 mmol/L (ref 135–145)

## 2014-02-05 LAB — PREGNANCY, URINE: PREG TEST UR: NEGATIVE

## 2014-02-05 MED ORDER — HYDROXYZINE HCL 25 MG PO TABS
50.0000 mg | ORAL_TABLET | Freq: Once | ORAL | Status: AC
  Administered 2014-02-05: 50 mg via ORAL
  Filled 2014-02-05: qty 2

## 2014-02-05 MED ORDER — HYDROXYZINE HCL 25 MG PO TABS: 25.0000 mg | ORAL_TABLET | Freq: Four times a day (QID) | ORAL | Status: DC

## 2014-02-05 NOTE — ED Notes (Signed)
Patient present to ED today for anxiety evaluation. Has long hx of anxiety and depression. Been in and out of hospital multiple times previously for the same issue. Reports her anxiety to be poorly controlled, takes klonopin 1 mg as need for anxiety but she feels anxious all the time. Her primary care doctor won't give her anything else, and she does not have a specialist. She was told to come to ED if she feels overly anxious. When she get anxious, she would pass out. Last night she passed out due to anxiety and hit the wall, which she now complaints of headache.   Pt appears to be cooperative and is friendly during the conversation. Got tearful and cried.

## 2014-02-05 NOTE — Discharge Instructions (Signed)
Please follow with your primary care doctor in the next 2 days for a check-up. They must obtain records for further management.   Do not hesitate to return to the Emergency Department for any new, worsening or concerning symptoms.    Panic Attacks Panic attacks are sudden, short-livedsurges of severe anxiety, fear, or discomfort. They may occur for no reason when you are relaxed, when you are anxious, or when you are sleeping. Panic attacks may occur for a number of reasons:   Healthy people occasionally have panic attacks in extreme, life-threatening situations, such as war or natural disasters. Normal anxiety is a protective mechanism of the body that helps [place] react to danger (fight or flight response).  Panic attacks are often seen with anxiety disorders, such as panic disorder, social anxiety disorder, generalized anxiety disorder, and phobias. Anxiety disorders cause excessive or uncontrollable anxiety. They may interfere with your relationships or other life activities.  Panic attacks are sometimes seen with other mental illnesses, such as depression and posttraumatic stress disorder.  Certain medical conditions, prescription medicines, and drugs of abuse can cause panic attacks. SYMPTOMS  Panic attacks start suddenly, peak within 20 minutes, and are accompanied by four or more of the following symptoms:  Pounding heart or fast heart rate (palpitations).  Sweating.  Trembling or shaking.  Shortness of breath or feeling smothered.  Feeling choked.  Chest pain or discomfort.  Nausea or strange feeling in your stomach.  Dizziness, light-headedness, or feeling like you will faint.  Chills or hot flushes.  Numbness or tingling in your lips or hands and feet.  Feeling that things are not real or feeling that you are not yourself.  Fear of losing control or going crazy.  Fear of dying. Some of these symptoms can mimic serious medical conditions. For example, you may think  you are having a heart attack. Although panic attacks can be very scary, they are not life threatening. DIAGNOSIS  Panic attacks are diagnosed through an assessment by your health care provider. Your health care provider will ask questions about your symptoms, such as where and when they occurred. Your health care provider will also ask about your medical history and use of alcohol and drugs, including prescription medicines. Your health care provider may order blood tests or other studies to rule out a serious medical condition. Your health care provider may refer you to a mental health professional for further evaluation. TREATMENT   Most healthy people who have one or two panic attacks in an extreme, life-threatening situation will not require treatment.  The treatment for panic attacks associated with anxiety disorders or other mental illness typically involves counseling with a mental health professional, medicine, or a combination of both. Your health care provider will help determine what treatment is best for you.  Panic attacks due to physical illness usually go away with treatment of the illness. If prescription medicine is causing panic attacks, talk with your health care provider about stopping the medicine, decreasing the dose, or substituting another medicine.  Panic attacks due to alcohol or drug abuse go away with abstinence. Some adults need professional help in order to stop drinking or using drugs. HOME CARE INSTRUCTIONS   Take all medicines as directed by your health care provider.   Schedule and attend follow-up visits as directed by your health care provider. It is important to keep all your appointments. SEEK MEDICAL CARE IF:  You are not able to take your medicines as prescribed.  Your symptoms do  not improve or get worse. SEEK IMMEDIATE MEDICAL CARE IF:   You experience panic attack symptoms that are different than your usual symptoms.  You have serious thoughts  about hurting yourself or others.  You are taking medicine for panic attacks and have a serious side effect. MAKE SURE YOU:  Understand these instructions.  Will watch your condition.  Will get help right away if you are not doing well or get worse. Document Released: 12/24/2004 Document Revised: 12/29/2012 Document Reviewed: 08/07/2012 [facility] Patient Information 2015 [place], [place]. This information is not intended to replace advice given to you by your health care provider. Make sure you discuss any questions you have with your health care provider.  Do not hesitate to return to the emergency room for any new, worsening or concerning symptoms.  Please obtain primary care using resource guide below. But the minute you were seen in the emergency room and that they will need to obtain records for further outpatient management.  Emily [name]  Emergency Department Resource Guide 1) Find a Doctor and Pay Out of Pocket Although you won't have to find out who is covered by your insurance plan, it is a good idea to ask around and get recommendations. You will then need to call the office and see if the doctor you have chosen will accept you as a new patient and what types of options they offer for patients who are self-pay. Some doctors offer discounts or will set up payment plans for their patients who do not have insurance, but you will need to ask so you aren't surprised when you get to your appointment.  2) Contact Your Local Health Department Not all health departments have doctors that can see patients for sick visits, but many do, so it is worth a call to see if yours does. If you don't know where your local health department is, you can check in your phone book. The CDC also has a tool to help you locate your state's health department, and many state websites also have listings of all of their local health departments.  3) Find a [facility] If your illness is not likely to be very severe  or complicated, you may want to try a walk in clinic. These are popping up all over the country in pharmacies, drugstores, and shopping centers. They're usually staffed by nurse practitioners or physician assistants that have been trained to treat common illnesses and complaints. They're usually fairly quick and inexpensive. However, if you have serious medical issues or chronic medical problems, these are probably not your best option.  No Primary Care Doctor: - Call Health Connect at  [phone] - they can help you locate a primary care doctor that  accepts your insurance, provides certain services, etc. - Physician Referral Service- [phone]  Chronic Pain Problems: Organization         Address  Phone   Notes  [facility]  [phone] Patients need to be referred by their primary care doctor.   Medication Assistance: Organization         Address  Phone   Notes  [facility] Medication [facility] [address] [place], [place] [address] [phone] --Must be a resident of [facility] -- Must have NO insurance coverage whatsoever (no Medicaid/ Medicare, etc.) -- The pt. MUST have a primary care doctor that directs their care regularly and follows them in the community   [facility]ist  [phone]   [address]  (  [phone]    Agencies that provide inexpensive medical care: Organization         Address  Phone   Notes  [place]  [phone]   [facility]    [phone]   [facility] [place], [place] [address] [phone]   [place] [address] [place] [phone]   Planned Parenthood    [phone]   [facility]    [phone]   [place] and [place] [address], [place] Phone:  [phone], Fax:  [phone] Hours of Operation:  9 am - 6 pm, M-F.  Also accepts  Medicaid/Medicare and self-pay.  [facility] for [address], [address], [place] Phone: [phone], Fax: [phone]. Hours of Operation:  8:30 am - 5:30 pm, M-F.  Also accepts Medicaid and self-pay.  [facility] [place] [address] [place] Phone: [phone]   [place], [place], [place] [phone], Ext. [phone] Mondays & Thursdays: 7-9 AM.  First 15 patients are seen on a first come, first serve basis.    [place] Providers:  Organization         Address  Phone   Notes  [facility] [address], [place] [phone] Also accepts self-pay patients.  [facility] [address], [place]  [phone]   [place], [address], [place] [phone]   [facility] [address] [place] [phone]   Lucianne Lei [address], [place]   [phone] Only accepts [place] Access [place] patients after they have their name applied to their card.   Self-Pay (no insurance) in [facility]:  Organization         Address  Phone   Notes  Sickle Cell Patients, [facility] [place] [phone]   [facility] Urgent Care [place] [phone]   [facility] [place]  [place], [place],  [phone]   [facility]/Dr. Osei-Bonsu  [address], [place] or [address], [place] [phone] Phone number for both [place] and [place] locations is the same.  Urgent Medical and [facility] [address] [place] [phone]   [facility] [address] [place] or [address] [phone]   [facility] [address], [place] [phone], phone; [phone], fax Sees patients 1st and 3rd Saturday of every month.  Must not qualify for public or private insurance (i.e. Medicaid, Medicare, [place] Health Choice, Veterans' Benefits)  Household income should be no more than 200% of the poverty level The clinic cannot treat you if you are pregnant or think you are pregnant  Sexually transmitted diseases are not treated at the clinic.    Dental Care: Organization         Address  Phone  Notes  [facility] Department of [facility] [place] [phone] Accepts children up to age 7 who are enrolled in [place] or [place]; pregnant women with a Medicaid card; and children who  have applied for Medicaid or [place] Health Choice, but were declined, whose parents can pay a reduced fee at time of service.  [facility] Department of [facility]  [address], [name] [phone] Accepts children up to age 68 who are enrolled in [place] or [place]; pregnant women with a Medicaid card; and children who have applied for Medicaid or [place] Health Choice, but were declined, whose parents can pay a reduced fee at time of service.  [place] [facility] Access PRO[facility]  [place] [phone] Patients are seen by appointment only. Walk-ins are not accepted. [place] will see patients 66 years of age and older. Monday - Tuesday (8am-5pm) Most Wednesdays (8:30-5pm) $30 per visit, cash only  [facility] Adult Dental Access PROGRAM  [address], [facility] [phone] Patients are seen by appointment only. Walk-ins are not accepted. [place] will see patients 18 years of age and older. One Wednesday Evening (Monthly: Volunteer Based).  $30 per visit, cash only  [place]  [phone] for adults; Children under age 79, call Graduate Pediatric Dentistry at [phone]. Children aged  2-14, please call [phone] to request a pediatric application.  Dental services are provided in all areas of dental care including fillings, crowns and bridges, complete and partial dentures, implants, gum treatment, root canals, and extractions. Preventive care is also provided. Treatment is provided to both adults and children. Patients are selected via a lottery and there is often a waiting list.   [facility] [address] [place]  [phone] [email or web address]   [facility] [address] [place], [place] [phone], Ext. [phone] Second and Fourth Thursday of each month, opens at 6:30 AM; Clinic ends at 9 AM.  Patients are seen on a first-come first-served basis, and a limited number are seen during each clinic.   [facility]  [address] Hillard Danker [place], [place] [phone]   Eligibility Requirements You must have lived in [place], [place], or [place] counties for at least the last three months.   You cannot be eligible for state or federal sponsored [employer], including [employer], [place], or [employer].   You generally cannot be eligible for healthcare insurance through your employer.    How to apply: Eligibility screenings are held every Tuesday and Wednesday afternoon from 1:00 pm until 4:00 pm. You do not need an appointment for the interview!  [facility] [address] [place], [place]   [place]  [place] Department  [place]  [phone]    Behavioral Health Resources in the Community: Intensive Outpatient Programs Organization         Address  Phone  Notes  [place]. [address], [place], [place] [phone]   [facility] [address] [place], [place]   [facility]: [facility] [address],  [place], [place]   [address]  [place], [place] or [phone]   Substance Abuse Resources Organization         Address  Phone  Notes  Al[facility] Drug Services  [phone]   [facility]  [phone]   [facility] [place]   [facility]  [phone]   Residential & Outpatient  Substance Abuse Program  [phone]   Psychological Services Organization         Address  Phone  Notes  [place]  [place]  [phone]   [place] [address], [place] or [phone]    Mobile Crisis Teams Organization         Address  Phone  Notes  [facility], Mobile Crisis Care Unit  [phone]   [facility]  [address] [place], [place]   [place] Levels [address], [place] [phone]    Self-Help/Support Groups Organization         Address  Phone             Notes  [facility] [place] - variety of support groups  [place] Call for more information  Narcotics Anonymous (NA), [facility] [address], [employer] [place]  2 meetings at this location   Special educational needs teacher         Address  Phone  Notes  [facility]idential [facility] [place],    [place]  [phone]   [facility]  [address] [place] [id number], [place], [place]   [place], [place] [phone] Admissions: 8am-3pm M-F  Incentives Substance [place] [address]    [place], [place] [phone]   [facility] [address] [place], [place], [place]   [facility] [address]  [place], [place]   [facility] - Intensive Outpatient [address], [place], [place]   [facility]  ([place].) [place].,  [place], [place] [phone] or [phone]   [facility] Services (RTS) [address] [place], [place] Accepts Medicaid  [facility] [place] [address],  [place] [phone] Substance Abuse/Addiction Treatment   [facility] Organization         Address  Phone  Notes  [facility]  [phone]   Domenic Schwab, PhD [address] Arlis Porta [place], [place]   [phone] or [phone]   [place] [place], [place] [phone]   [facility] [address], [place], [place] [phone] Insurance/Medicaid/sponsorship through [facility] and Families [address], [address] [place]                                    [place], [place] [phone] [place] [address], [place] [phone]    Dr. Adele Schilder  [phone]   [facility] Dept. 1) [address], [place] 2) [place] 3)  [place] [address], [place] [phone]  [phone]   [place] [phone] or [phone] (After Hours)

## 2014-02-05 NOTE — ED Provider Notes (Signed)
CSN: [id number]     Arrival date & time 02/05/14  1133 History   First MD Initiated Contact with Patient 02/05/14 1208     Chief Complaint  Patient presents with  . Anxiety     (Consider location/radiation/quality/duration/timing/severity/associated sxs/prior Treatment) HPI   Emily [name] is a 34 y.o. female complaining of anxiety exacerbation. Patient states she has chronic anxiety, she takes Klonopin 1 milligram needed for anxiety but states it has not been helping her for a long time. She states that her primary care physician is willing to increase her dosage. States that she has followed [facility] and is also discuss this with her neurologist who manages her [facility] [place] have declined to increase her dosage. She denies fever, chills, nausea, vomiting, chest pain, shortness of breath, Seidel ideation, homicidal ideation, drug or alcohol abuse. States that it is normal when she has a panic attack for her to pass out. She states that when she fell this time she hit her neck on a wall. She denies weakness, numbness.   Past Medical History  Diagnosis Date  . Headache(784.0)     migraines  . Multiple sclerosis   . Asthma as a child  . GERD (gastroesophageal reflux disease)   . Urinary urgency   . Anxiety   . Perforated bowel 2009  . Migraines   . Depression   . Chronic back pain   . Chronic pain   . Ovarian cyst   . Panic attack    Past Surgical History  Procedure Laterality Date  . Extremity cyst excision  1994    right leg  . 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 Colostomy closure  04/2007  . Scar revision  01/21/2011    Procedure: SCAR REVISION;  Surgeon: Hermelinda Dellen;  Location: [place];  Service: Plastics;  Laterality: N/A;  exploration of scar of abdomen and repair of defect  . Hernia repair    . Abdominal surgery    . Appendectomy     Family History  Problem Relation Age of Onset  . Diabetes Mother   . Hypertension Mother   .  Diabetes Father   . Hypertension Father    History  Substance Use Topics  . Smoking status: Current Some Day Smoker -- 0.40 packs/day for 10 years    Types: Cigarettes  . Smokeless tobacco: Never Used     Comment: quit smoking 10 days ago  . Alcohol Use: No     Comment: Rare   OB History    No data available     Review of Systems  10 systems reviewed and found to be negative, except as noted in the HPI.   Allergies  Amitriptyline; Baclofen; Cymbalta; Gabapentin; Monosodium glutamate; Vicodin; Alprazolam; Magnesium salicylate; Rizatriptan; Tizanidine; Toradol; Tramadol; Adhesive; and Lamotrigine  Home Medications   Prior to Admission medications   Medication Sig Start Date End Date Taking? Authorizing Provider  Alum & Mag Hydroxide-Simeth (MAGIC MOUTHWASH W/LIDOCAINE) SOLN Take 5 mLs by mouth 3 (three) times daily as needed (For reflux pain). 10/24/13   Tanna Furry, MD  carbamazepine (CARBATROL) 200 MG 12 hr capsule Take 200 mg by mouth 3 (three) times daily. 04/22/13 04/22/14  Historical Provider, MD  celecoxib (CELEBREX) 100 MG capsule Take 1 capsule (100 mg total) by mouth 2 (two) times daily. 11/01/13   Lenox Ahr, PA-C  chlorthalidone (HYGROTON) 25 MG tablet Take 1 tablet (25 mg total) by mouth daily.  03/27/20   Delora Fuel, MD  cyclobenzaprine (FLEXERIL) 10 MG tablet Take 10 mg by mouth 3 (three) times daily as needed for muscle spasms.    Historical Provider, MD  diphenhydrAMINE (BENADRYL) 25 MG tablet Take 50 mg by mouth every 8 (eight) hours as needed for sleep.     Historical Provider, MD  EPINEPHrine (EPIPEN) 0.3 mg/0.3 mL SOAJ injection Inject 0.3 mg into the muscle as needed (allergic reaction).     Historical Provider, MD  Fingolimod HCl (GILENYA) 0.5 MG CAPS Take 0.5 mg by mouth daily.     Historical Provider, MD  lactulose (CHRONULAC) 10 GM/15ML solution Take 15 mLs (10 g total) by mouth 2 (two) times daily as needed for mild constipation or moderate constipation.  12/06/13   Richarda Blade, MD  loratadine (CLARITIN) 10 MG tablet Take 10 mg by mouth daily.     Historical Provider, MD  medroxyPROGESTERone (DEPO-PROVERA) 150 MG/ML injection Inject 150 mg into the muscle every 3 (three) months. Last injection mid September    Historical Provider, MD  omeprazole (PRILOSEC) 40 MG capsule Take 40 mg by mouth daily.     Historical Provider, MD  ondansetron (ZOFRAN) 8 MG tablet Take 8 mg by mouth every 6 (six) hours as needed for nausea or vomiting.     Historical Provider, MD  potassium chloride (K-DUR) 10 MEQ tablet Take 10 mEq by mouth 2 (two) times daily.    Historical Provider, MD  pregabalin (LYRICA) 200 MG capsule Take 600 mg by mouth at bedtime.    Historical Provider, MD  promethazine (PHENERGAN) 25 MG tablet Take 25 mg by mouth every 6 (six) hours as needed for nausea or vomiting.     Historical Provider, MD  propranolol (INDERAL) 60 MG tablet Take 1 tablet (60 mg total) by mouth 2 (two) times daily. 11/23/13   Penni Bombard, MD  ranitidine (ZANTAC) 300 MG capsule Take 300 mg by mouth at bedtime.     Historical Provider, MD  topiramate (TOPAMAX) 100 MG tablet Take 100 mg by mouth 2 (two) times daily.     Historical Provider, MD  venlafaxine (EFFEXOR) 75 MG tablet Take 75 mg by mouth 3 (three) times daily with meals.    Historical Provider, MD   BP 146/107 mmHg  Pulse 114  Temp(Src) 98.8 F (37.1 C) (Oral)  Resp 18  Ht 5\' 5"  (1.651 m)  Wt 220 lb (99.791 kg)  BMI 36.61 kg/m2  SpO2 100%  LMP 02/01/2007 Physical Exam  Constitutional: She is oriented to person, place, and time. She appears well-developed and well-nourished. No distress.  HENT:  Head: Normocephalic and atraumatic.  Mouth/Throat: Oropharynx is clear and moist.  Eyes: Conjunctivae and EOM are normal. Pupils are equal, round, and reactive to light.  Neck: Normal range of motion.  Lower midline cervical tenderness to palpation with no step-offs. Patient has full range of motion  without pain. Grip strength is equal bilaterally, she can differentiate between pinprick  and light touch to upper extremities.  Cardiovascular: Normal rate, regular rhythm and intact distal pulses.   No murmur heard. Pulmonary/Chest: Effort normal and breath sounds normal. No stridor. No respiratory distress. She has no wheezes. She has no rales. She exhibits no tenderness.  Abdominal: Soft. Bowel sounds are normal.  Musculoskeletal: Normal range of motion. She exhibits no edema.  Neurological: She is alert and oriented to person, place, and time.  Skin: Skin is warm.  Psychiatric: She has a normal mood and affect.  Nursing note and vitals reviewed.   ED Course  Procedures (including critical care time) Labs Review Labs Reviewed  BASIC METABOLIC PANEL - Abnormal; Notable for the following:    Glucose, Bld 107 (*)    Calcium 8.2 (*)    Anion gap 3 (*)    All other components within normal limits  CBC WITH DIFFERENTIAL/PLATELET  PREGNANCY, URINE    Imaging Review Dg Cervical Spine Complete  02/05/2014   CLINICAL DATA:  34 year old female with a history of fall. Posterior neck pain.  EXAM: CERVICAL SPINE  4+ VIEWS  COMPARISON:  None.  FINDINGS: Cervical Spine:  Cervical elements from the level of the C1-C7 maintain relative anatomic alignment. The cervical thoracic junction not well visualized on the study.  Reversal of the normal cervical lordosis.  No acute fracture line identified.  Unremarkable appearance of the craniocervical junction.  Trace anterolisthesis of C3 on C4.  Vertebral body heights relatively maintained. Disc space heights are narrowed, particularly at C5-C6 and C6-C7 where there are endplate changes and advanced anterior osteophyte production.  Uncovertebral joint disease present throughout, worst at through the levels of C5-C7.  Oblique images demonstrate mild neural foraminal narrowing at C5-C6 and C6-C7 bilaterally.  Unremarkable appearance of the paravertebral soft  tissues.  Open mouth odontoid view demonstrates relatively symmetric atlantodental distance with alignment of the lateral masses of C1 and C2.  IMPRESSION: No acute fracture or malalignment of the cervical spine, though the a cervical thoracic junction not well visualized.  Degenerative disc disease worst at C5 through C7 with associated facet disease contributing to mild neural foraminal narrowing at these levels.  Signed,  Dulcy Fanny. Earleen Newport, DO  [facility]  [facility]   Electronically Signed   By: Corrie Mckusick D.O.   On: 02/05/2014 13:22     EKG Interpretation None      MDM   Final diagnoses:  Anxiety    Filed Vitals:   02/05/14 1139 02/05/14 1423  BP: 146/107 157/104  Pulse: 114 99  Temp: 98.8 F (37.1 C)   TempSrc: Oral   Resp: 18 18  Height: 5\' 5"  (1.651 m)   Weight: 220 lb (99.791 kg)   SpO2: 100% 100%    Medications  hydrOXYzine (ATARAX/VISTARIL) tablet 50 mg (50 mg Oral Given 02/05/14 1422)    Emily [name] is a pleasant 34 y.o. female presenting with exacerbation of chronic anxiety patient also reports a syncopal episode where she had a impact to her spine. Patient reports that she always comprises when she becomes very agitated. Patient denies any suicidal ideation, homicidal ideation, no indication for emergent psychiatric intervention at this time. EKG is without ischemia, arrhythmia. C-spine x-rays negative. We'll give resource guide and advise follow-up with psychiatry for management of her anxiety.  Evaluation does not show pathology that would require ongoing emergent intervention or inpatient treatment. Pt is hemodynamically stable and mentating appropriately. Discussed findings and plan with patient/guardian, who agrees with care plan. All questions answered. Return precautions discussed and outpatient follow up given.   New Prescriptions   HYDROXYZINE (ATARAX/VISTARIL) 25 MG TABLET    Take 1 tablet (25 mg total)  by mouth every 6 (six) hours.         Monico Blitz, PA-C 02/05/14 [place], MD 02/05/14 [place], MD 02/05/14 [phone]

## 2014-03-07 ENCOUNTER — Emergency Department (HOSPITAL_COMMUNITY)
Admission: EM | Admit: 2014-03-07 | Discharge: 2014-03-07 | Disposition: A | Discharge status: Home / Self Care | Payer: Medicare Other | Attending: Emergency Medicine | Admitting: Emergency Medicine

## 2014-03-07 ENCOUNTER — Encounter (HOSPITAL_COMMUNITY): Payer: Self-pay | Admitting: *Deleted

## 2014-03-07 DIAGNOSIS — Z9049 Acquired absence of other specified parts of digestive tract: Secondary | ICD-10-CM | POA: Diagnosis not present

## 2014-03-07 DIAGNOSIS — R109 Unspecified abdominal pain: Secondary | ICD-10-CM | POA: Insufficient documentation

## 2014-03-07 DIAGNOSIS — F410 Panic disorder [episodic paroxysmal anxiety] without agoraphobia: Secondary | ICD-10-CM | POA: Diagnosis not present

## 2014-03-07 DIAGNOSIS — R112 Nausea with vomiting, unspecified: Secondary | ICD-10-CM | POA: Insufficient documentation

## 2014-03-07 DIAGNOSIS — J45909 Unspecified asthma, uncomplicated: Secondary | ICD-10-CM | POA: Insufficient documentation

## 2014-03-07 DIAGNOSIS — K219 Gastro-esophageal reflux disease without esophagitis: Secondary | ICD-10-CM | POA: Insufficient documentation

## 2014-03-07 DIAGNOSIS — Z720 Tobacco use: Secondary | ICD-10-CM | POA: Insufficient documentation

## 2014-03-07 DIAGNOSIS — R110 Nausea: Secondary | ICD-10-CM

## 2014-03-07 DIAGNOSIS — Z3202 Encounter for pregnancy test, result negative: Secondary | ICD-10-CM | POA: Diagnosis not present

## 2014-03-07 DIAGNOSIS — Z87448 Personal history of other diseases of urinary system: Secondary | ICD-10-CM | POA: Diagnosis not present

## 2014-03-07 DIAGNOSIS — F329 Major depressive disorder, single episode, unspecified: Secondary | ICD-10-CM | POA: Insufficient documentation

## 2014-03-07 DIAGNOSIS — Z9889 Other specified postprocedural states: Secondary | ICD-10-CM | POA: Insufficient documentation

## 2014-03-07 DIAGNOSIS — G8929 Other chronic pain: Secondary | ICD-10-CM | POA: Insufficient documentation

## 2014-03-07 DIAGNOSIS — Z79899 Other long term (current) drug therapy: Secondary | ICD-10-CM | POA: Diagnosis not present

## 2014-03-07 LAB — BASIC METABOLIC PANEL
Anion gap: 7 (ref 5–15)
BUN: 10 mg/dL (ref 6–23)
CO2: 24 mmol/L (ref 19–32)
Calcium: 8.7 mg/dL (ref 8.4–10.5)
Chloride: 109 mmol/L (ref 96–112)
Creatinine, Ser: 0.6 mg/dL (ref 0.50–1.10)
GFR calc Af Amer: >90 mL/min (ref >90)
GFR calc non Af Amer: >90 mL/min (ref >90)
Glucose, Bld: 101 mg/dL — ABNORMAL HIGH (ref 70–99)
POTASSIUM: 3.9 mmol/L (ref 3.5–5.1)
SODIUM: 140 mmol/L (ref 135–145)

## 2014-03-07 LAB — CBC WITH DIFFERENTIAL/PLATELET
Basophils Absolute: 0 10*3/uL (ref 0.0–0.1)
Basophils Relative: 0 % (ref 0–1)
Eosinophils Absolute: 0.1 10*3/uL (ref 0.0–0.7)
Eosinophils Relative: 1 % (ref 0–5)
HCT: 41 % (ref 36.0–46.0)
HEMOGLOBIN: 13.7 g/dL (ref 12.0–15.0)
LYMPHS ABS: 1.5 10*3/uL (ref 0.7–4.0)
LYMPHS PCT: 18 % (ref 12–46)
MCH: 30.1 pg (ref 26.0–34.0)
MCHC: 33.4 g/dL (ref 30.0–36.0)
MCV: 90.1 fL (ref 78.0–100.0)
MONOS PCT: 4 % (ref 3–12)
Monocytes Absolute: 0.3 10*3/uL (ref 0.1–1.0)
NEUTROS ABS: 6.5 10*3/uL (ref 1.7–7.7)
NEUTROS PCT: 77 % (ref 43–77)
PLATELETS: 346 10*3/uL (ref 150–400)
RBC: 4.55 MIL/uL (ref 3.87–5.11)
RDW: 12.4 % (ref 11.5–15.5)
WBC: 8.4 10*3/uL (ref 4.0–10.5)

## 2014-03-07 LAB — URINALYSIS, ROUTINE W REFLEX MICROSCOPIC
Bilirubin Urine: NEGATIVE
Glucose, UA: NEGATIVE mg/dL
Ketones, ur: NEGATIVE mg/dL
LEUKOCYTES UA: NEGATIVE
Nitrite: NEGATIVE
Protein, ur: NEGATIVE mg/dL
SPECIFIC GRAVITY, URINE: 1.025 (ref 1.005–1.030)
Urobilinogen, UA: 0.2 mg/dL (ref 0.0–1.0)
pH: 6 (ref 5.0–8.0)

## 2014-03-07 LAB — URINE MICROSCOPIC-ADD ON

## 2014-03-07 LAB — PREGNANCY, URINE: PREG TEST UR: NEGATIVE

## 2014-03-07 MED ORDER — PENTAFLUOROPROP-TETRAFLUOROETH EX AERO: INHALATION_SPRAY | CUTANEOUS | Status: DC | PRN

## 2014-03-07 MED ORDER — ONDANSETRON 4 MG PO TBDP
4.0000 mg | ORAL_TABLET | Freq: Once | ORAL | Status: AC
  Administered 2014-03-07: 4 mg via ORAL
  Filled 2014-03-07: qty 1

## 2014-03-07 MED ORDER — PROMETHAZINE HCL 25 MG/ML IJ SOLN
25.0000 mg | Freq: Once | INTRAMUSCULAR | Status: AC
  Administered 2014-03-07: 25 mg via INTRAMUSCULAR
  Filled 2014-03-07: qty 1

## 2014-03-07 NOTE — ED Provider Notes (Signed)
CSN: [id number]     Arrival date & time 03/07/14  1111 History   First MD Initiated Contact with Patient 03/07/14 1303     Chief Complaint  Patient presents with  . Emesis      HPI  Vomiting since yesterday 04:00.  Reports intermittent lower AP.  No hematemesis. No diarrhea. Denies headache today. No urinary symptoms. No other change in baseline health.    Past Medical History  Diagnosis Date  . Headache(784.0)     migraines  . Multiple sclerosis   . Asthma as a child  . GERD (gastroesophageal reflux disease)   . Urinary urgency   . Anxiety   . Perforated bowel 2009  . Migraines   . Depression   . Chronic back pain   . Chronic pain   . Ovarian cyst   . Panic attack    Past Surgical History  Procedure Laterality Date  . Extremity cyst excision  1994    right leg  . 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 Colostomy closure  04/2007  . Scar revision  01/21/2011    Procedure: SCAR REVISION;  Surgeon: Hermelinda Dellen;  Location: [place];  Service: Plastics;  Laterality: N/A;  exploration of scar of abdomen and repair of defect  . Hernia repair    . Abdominal surgery    . Appendectomy     Family History  Problem Relation Age of Onset  . Diabetes Mother   . Hypertension Mother   . Diabetes Father   . Hypertension Father    History  Substance Use Topics  . Smoking status: Current Some Day Smoker -- 0.40 packs/day for 10 years    Types: Cigarettes  . Smokeless tobacco: Never Used     Comment: quit smoking 10 days ago  . Alcohol Use: No     Comment: Rare   OB History    No data available     Review of Systems  Constitutional: Negative for fever, chills, diaphoresis, appetite change and fatigue.  HENT: Negative for mouth sores, sore throat and trouble swallowing.   Eyes: Negative for visual disturbance.  Respiratory: Negative for cough, chest tightness, shortness of breath and wheezing.   Cardiovascular: Negative for chest pain.   Gastrointestinal: Positive for nausea and abdominal pain. Negative for vomiting, diarrhea and abdominal distention.  Endocrine: Negative for polydipsia, polyphagia and polyuria.  Genitourinary: Negative for dysuria, frequency and hematuria.  Musculoskeletal: Negative for gait problem.  Skin: Negative for color change, pallor and rash.  Neurological: Negative for dizziness, syncope, light-headedness and headaches.  Hematological: Does not bruise/bleed easily.  Psychiatric/Behavioral: Negative for behavioral problems and confusion.      Allergies  Amitriptyline; Baclofen; Cymbalta; Gabapentin; Monosodium glutamate; Vicodin; Alprazolam; Magnesium salicylate; Rizatriptan; Tizanidine; Toradol; Tramadol; Adhesive; and Lamotrigine  Home Medications   Prior to Admission medications   Medication Sig Start Date End Date Taking? Authorizing Provider  carbamazepine (CARBATROL) 200 MG 12 hr capsule Take 200 mg by mouth 3 (three) times daily. 04/22/13 04/22/14 Yes Historical Provider, MD  celecoxib (CELEBREX) 100 MG capsule Take 1 capsule (100 mg total) by mouth 2 (two) times daily. 11/01/13  Yes Lenox Ahr, PA-C  cyclobenzaprine (FLEXERIL) 10 MG tablet Take 10 mg by mouth 3 (three) times daily as needed for muscle spasms.   Yes Historical Provider, MD  diphenhydrAMINE (BENADRYL) 25 MG tablet Take 50 mg by mouth every 8 (eight) hours as needed for sleep.  Yes Historical Provider, MD  EPINEPHrine (EPIPEN) 0.3 mg/0.3 mL SOAJ injection Inject 0.3 mg into the muscle as needed (allergic reaction).    Yes Historical Provider, MD  Fingolimod HCl (GILENYA) 0.5 MG CAPS Take 0.5 mg by mouth daily.    Yes Historical Provider, MD  hydrOXYzine (ATARAX/VISTARIL) 25 MG tablet Take 1 tablet (25 mg total) by mouth every 6 (six) hours. 02/05/14  Yes Nicole Pisciotta, PA-C  loratadine (CLARITIN) 10 MG tablet Take 10 mg by mouth daily.    Yes Historical Provider, MD  medroxyPROGESTERone (DEPO-PROVERA) 150 MG/ML  injection Inject 150 mg into the muscle every 3 (three) months. Last injection mid September   Yes Historical Provider, MD  omeprazole (PRILOSEC) 40 MG capsule Take 40 mg by mouth daily.    Yes Historical Provider, MD  ondansetron (ZOFRAN) 8 MG tablet Take 8 mg by mouth every 6 (six) hours as needed for nausea or vomiting.    Yes Historical Provider, MD  potassium chloride (K-DUR) 10 MEQ tablet Take 10 mEq by mouth 2 (two) times daily.   Yes Historical Provider, MD  pregabalin (LYRICA) 200 MG capsule Take 600 mg by mouth at bedtime.   Yes Historical Provider, MD  promethazine (PHENERGAN) 25 MG tablet Take 25 mg by mouth every 6 (six) hours as needed for nausea or vomiting.    Yes Historical Provider, MD  propranolol (INDERAL) 60 MG tablet Take 1 tablet (60 mg total) by mouth 2 (two) times daily. 11/23/13  Yes Penni Bombard, MD  ranitidine (ZANTAC) 300 MG capsule Take 300 mg by mouth at bedtime.    Yes Historical Provider, MD  topiramate (TOPAMAX) 100 MG tablet Take 100 mg by mouth 2 (two) times daily.    Yes Historical Provider, MD  venlafaxine (EFFEXOR) 75 MG tablet Take 75 mg by mouth 3 (three) times daily with meals.   Yes Historical Provider, MD  Alum & Mag Hydroxide-Simeth (MAGIC MOUTHWASH W/LIDOCAINE) SOLN Take 5 mLs by mouth 3 (three) times daily as needed (For reflux pain). Patient not taking: Reported on 03/07/2014 10/24/13   Tanna Furry, MD  chlorthalidone (HYGROTON) 25 MG tablet Take 1 tablet (25 mg total) by mouth daily. Patient not taking: Reported on 03/07/2014 63/14/97   Delora Fuel, MD  lactulose [facility]) 10 GM/15ML solution Take 15 mLs (10 g total) by mouth 2 (two) times daily as needed for mild constipation or moderate constipation. Patient not taking: Reported on 03/07/2014 12/06/13   Richarda Blade, MD   BP 125/88 mmHg  Pulse 96  Temp(Src) 98.1 F (36.7 C) (Oral)  Resp 18  Ht 5\' 5"  (1.651 m)  Wt 220 lb (99.791 kg)  BMI 36.61 kg/m2  SpO2 96%  LMP  Physical Exam   Constitutional: She is oriented to person, place, and time. She appears well-developed and well-nourished. No distress.  HENT:  Head: Normocephalic.  Eyes: Conjunctivae are normal. Pupils are equal, round, and reactive to light. No scleral icterus.  Neck: Normal range of motion. Neck supple. No thyromegaly present.  Cardiovascular: Normal rate and regular rhythm.  Exam reveals no gallop and no friction rub.   No murmur heard. Pulmonary/Chest: Effort normal and breath sounds normal. No respiratory distress. She has no wheezes. She has no rales.  Abdominal: Soft. Bowel sounds are normal. She exhibits no distension. There is no tenderness. There is no rebound.  Musculoskeletal: Normal range of motion.  Neurological: She is alert and oriented to person, place, and time.  Skin: Skin is warm and  dry. No rash noted.  Psychiatric: She has a normal mood and affect. Her behavior is normal.    ED Course  Procedures (including critical care time) Labs Review Labs Reviewed  BASIC METABOLIC PANEL - Abnormal; Notable for the following:    Glucose, Bld 101 (*)    All other components within normal limits  URINALYSIS, ROUTINE W REFLEX MICROSCOPIC - Abnormal; Notable for the following:    Hgb urine dipstick TRACE (*)    All other components within normal limits  URINE MICROSCOPIC-ADD ON - Abnormal; Notable for the following:    Squamous Epithelial / LPF FEW (*)    Bacteria, UA FEW (*)    All other components within normal limits  CBC WITH DIFFERENTIAL/PLATELET  PREGNANCY, URINE    Imaging Review No results found.   EKG Interpretation None      MDM   Final diagnoses:  Nausea    No emesis here.  Taking po. Reassuring labs.     Tanna Furry, MD 03/07/14 [phone]

## 2014-03-07 NOTE — ED Notes (Signed)
Vomiting, onset last night., no relief with zofran.  "panic attacks"

## 2014-03-07 NOTE — ED Notes (Signed)
MD at bedside. 

## 2014-03-07 NOTE — Discharge Instructions (Signed)

## 2014-03-13 ENCOUNTER — Encounter (HOSPITAL_BASED_OUTPATIENT_CLINIC_OR_DEPARTMENT_OTHER): Payer: Self-pay | Admitting: *Deleted

## 2014-03-13 ENCOUNTER — Emergency Department (HOSPITAL_BASED_OUTPATIENT_CLINIC_OR_DEPARTMENT_OTHER)
Admission: EM | Admit: 2014-03-13 | Discharge: 2014-03-13 | Discharge status: Against Medical Advice | Payer: Medicare Other | Attending: Emergency Medicine | Admitting: Emergency Medicine

## 2014-03-13 DIAGNOSIS — J45909 Unspecified asthma, uncomplicated: Secondary | ICD-10-CM | POA: Diagnosis not present

## 2014-03-13 DIAGNOSIS — G8929 Other chronic pain: Secondary | ICD-10-CM | POA: Insufficient documentation

## 2014-03-13 DIAGNOSIS — Z720 Tobacco use: Secondary | ICD-10-CM | POA: Diagnosis not present

## 2014-03-13 DIAGNOSIS — W1789XA Other fall from one level to another, initial encounter: Secondary | ICD-10-CM | POA: Diagnosis not present

## 2014-03-13 DIAGNOSIS — Y9301 Activity, walking, marching and hiking: Secondary | ICD-10-CM | POA: Diagnosis not present

## 2014-03-13 DIAGNOSIS — Y998 Other external cause status: Secondary | ICD-10-CM | POA: Diagnosis not present

## 2014-03-13 DIAGNOSIS — S4991XA Unspecified injury of right shoulder and upper arm, initial encounter: Secondary | ICD-10-CM | POA: Insufficient documentation

## 2014-03-13 DIAGNOSIS — Y9289 Other specified places as the place of occurrence of the external cause: Secondary | ICD-10-CM | POA: Diagnosis not present

## 2014-03-13 DIAGNOSIS — S3992XA Unspecified injury of lower back, initial encounter: Secondary | ICD-10-CM | POA: Diagnosis present

## 2014-03-13 NOTE — ED Notes (Signed)
C/o fall walking up hill and grass was wet and slid face first to grass. No obvious injury to face. C/o right lower back pain and right shoulder pain.

## 2014-04-03 ENCOUNTER — Encounter (HOSPITAL_COMMUNITY): Payer: Self-pay | Admitting: Nurse Practitioner

## 2014-04-03 ENCOUNTER — Emergency Department (HOSPITAL_COMMUNITY): Payer: Medicare Other

## 2014-04-03 ENCOUNTER — Emergency Department (HOSPITAL_COMMUNITY)
Admission: EM | Admit: 2014-04-03 | Discharge: 2014-04-03 | Disposition: A | Discharge status: Home / Self Care | Payer: Medicare Other | Attending: Emergency Medicine | Admitting: Emergency Medicine

## 2014-04-03 DIAGNOSIS — J45909 Unspecified asthma, uncomplicated: Secondary | ICD-10-CM | POA: Diagnosis not present

## 2014-04-03 DIAGNOSIS — G8929 Other chronic pain: Secondary | ICD-10-CM | POA: Insufficient documentation

## 2014-04-03 DIAGNOSIS — Y9389 Activity, other specified: Secondary | ICD-10-CM | POA: Insufficient documentation

## 2014-04-03 DIAGNOSIS — F329 Major depressive disorder, single episode, unspecified: Secondary | ICD-10-CM | POA: Diagnosis not present

## 2014-04-03 DIAGNOSIS — Z79899 Other long term (current) drug therapy: Secondary | ICD-10-CM | POA: Diagnosis not present

## 2014-04-03 DIAGNOSIS — S79912A Unspecified injury of left hip, initial encounter: Secondary | ICD-10-CM | POA: Insufficient documentation

## 2014-04-03 DIAGNOSIS — Z3202 Encounter for pregnancy test, result negative: Secondary | ICD-10-CM | POA: Insufficient documentation

## 2014-04-03 DIAGNOSIS — Z720 Tobacco use: Secondary | ICD-10-CM | POA: Insufficient documentation

## 2014-04-03 DIAGNOSIS — Y998 Other external cause status: Secondary | ICD-10-CM | POA: Diagnosis not present

## 2014-04-03 DIAGNOSIS — G35 Multiple sclerosis: Secondary | ICD-10-CM | POA: Diagnosis not present

## 2014-04-03 DIAGNOSIS — K219 Gastro-esophageal reflux disease without esophagitis: Secondary | ICD-10-CM | POA: Insufficient documentation

## 2014-04-03 DIAGNOSIS — Y92481 Parking lot as the place of occurrence of the external cause: Secondary | ICD-10-CM | POA: Insufficient documentation

## 2014-04-03 DIAGNOSIS — Z8742 Personal history of other diseases of the female genital tract: Secondary | ICD-10-CM | POA: Diagnosis not present

## 2014-04-03 DIAGNOSIS — F410 Panic disorder [episodic paroxysmal anxiety] without agoraphobia: Secondary | ICD-10-CM | POA: Diagnosis not present

## 2014-04-03 DIAGNOSIS — G43909 Migraine, unspecified, not intractable, without status migrainosus: Secondary | ICD-10-CM | POA: Diagnosis not present

## 2014-04-03 DIAGNOSIS — M25552 Pain in left hip: Secondary | ICD-10-CM

## 2014-04-03 LAB — POC URINE PREG, ED: PREG TEST UR: NEGATIVE

## 2014-04-03 MED ORDER — ONDANSETRON HCL 4 MG PO TABS
4.0000 mg | ORAL_TABLET | Freq: Once | ORAL | Status: AC
  Administered 2014-04-03: 4 mg via ORAL
  Filled 2014-04-03: qty 1

## 2014-04-03 MED ORDER — OXYCODONE-ACETAMINOPHEN 5-325 MG PO TABS
1.0000 | ORAL_TABLET | Freq: Once | ORAL | Status: AC
  Administered 2014-04-03: 1 via ORAL
  Filled 2014-04-03: qty 1

## 2014-04-03 NOTE — ED Notes (Signed)
When asked about her allergies, patient states the pain medications listed make her nauseated and vomit. She then states she was told to not take ASA etc due to the likely hood of irration to her stomach and intestines and therefore she cannot take anything containing ASA, ibuprofen and acetaminophen. Patient is unaware that acetaminophen is not a part of the NSAID group. I informed the patient that I would speak to the MD and we would provide some follow up education.

## 2014-04-03 NOTE — Discharge Instructions (Signed)
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
## 2014-04-03 NOTE — ED Notes (Signed)
Patient states understanding about that muscle pain will increase over the next few days and that she should ice the area as per the discharge instructions. She used teach back methods for immediate follow up care as needed. Wheeled to lobby.
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
## 2014-04-14 ENCOUNTER — Emergency Department (HOSPITAL_BASED_OUTPATIENT_CLINIC_OR_DEPARTMENT_OTHER)
Admission: EM | Admit: 2014-04-14 | Discharge: 2014-04-14 | Disposition: A | Discharge status: Home / Self Care | Payer: Medicare Other | Attending: Emergency Medicine | Admitting: Emergency Medicine

## 2014-04-14 ENCOUNTER — Encounter (HOSPITAL_BASED_OUTPATIENT_CLINIC_OR_DEPARTMENT_OTHER): Payer: Self-pay | Admitting: Emergency Medicine

## 2014-04-14 ENCOUNTER — Emergency Department (HOSPITAL_BASED_OUTPATIENT_CLINIC_OR_DEPARTMENT_OTHER): Payer: Medicare Other

## 2014-04-14 DIAGNOSIS — F410 Panic disorder [episodic paroxysmal anxiety] without agoraphobia: Secondary | ICD-10-CM | POA: Insufficient documentation

## 2014-04-14 DIAGNOSIS — Z79899 Other long term (current) drug therapy: Secondary | ICD-10-CM | POA: Insufficient documentation

## 2014-04-14 DIAGNOSIS — G8929 Other chronic pain: Secondary | ICD-10-CM | POA: Diagnosis not present

## 2014-04-14 DIAGNOSIS — Z3202 Encounter for pregnancy test, result negative: Secondary | ICD-10-CM | POA: Insufficient documentation

## 2014-04-14 DIAGNOSIS — K219 Gastro-esophageal reflux disease without esophagitis: Secondary | ICD-10-CM | POA: Insufficient documentation

## 2014-04-14 DIAGNOSIS — G43909 Migraine, unspecified, not intractable, without status migrainosus: Secondary | ICD-10-CM | POA: Insufficient documentation

## 2014-04-14 DIAGNOSIS — F1210 Cannabis abuse, uncomplicated: Secondary | ICD-10-CM | POA: Insufficient documentation

## 2014-04-14 DIAGNOSIS — Z720 Tobacco use: Secondary | ICD-10-CM | POA: Diagnosis not present

## 2014-04-14 DIAGNOSIS — G35 Multiple sclerosis: Secondary | ICD-10-CM | POA: Insufficient documentation

## 2014-04-14 DIAGNOSIS — F1110 Opioid abuse, uncomplicated: Secondary | ICD-10-CM | POA: Diagnosis not present

## 2014-04-14 DIAGNOSIS — Z8719 Personal history of other diseases of the digestive system: Secondary | ICD-10-CM | POA: Insufficient documentation

## 2014-04-14 DIAGNOSIS — Z791 Long term (current) use of non-steroidal anti-inflammatories (NSAID): Secondary | ICD-10-CM | POA: Insufficient documentation

## 2014-04-14 DIAGNOSIS — R569 Unspecified convulsions: Secondary | ICD-10-CM | POA: Diagnosis present

## 2014-04-14 DIAGNOSIS — F1310 Sedative, hypnotic or anxiolytic abuse, uncomplicated: Secondary | ICD-10-CM | POA: Insufficient documentation

## 2014-04-14 DIAGNOSIS — Z8742 Personal history of other diseases of the female genital tract: Secondary | ICD-10-CM | POA: Insufficient documentation

## 2014-04-14 DIAGNOSIS — J45909 Unspecified asthma, uncomplicated: Secondary | ICD-10-CM | POA: Insufficient documentation

## 2014-04-14 DIAGNOSIS — E876 Hypokalemia: Secondary | ICD-10-CM | POA: Diagnosis not present

## 2014-04-14 LAB — CBC WITH DIFFERENTIAL/PLATELET
BASOS ABS: 0 10*3/uL (ref 0.0–0.1)
Basophils Relative: 0 % (ref 0–1)
EOS PCT: 1 % (ref 0–5)
Eosinophils Absolute: 0.1 10*3/uL (ref 0.0–0.7)
HCT: 46.3 % — ABNORMAL HIGH (ref 36.0–46.0)
Hemoglobin: 15.7 g/dL — ABNORMAL HIGH (ref 12.0–15.0)
LYMPHS PCT: 23 % (ref 12–46)
Lymphs Abs: 3.2 10*3/uL (ref 0.7–4.0)
MCH: 29.6 pg (ref 26.0–34.0)
MCHC: 33.9 g/dL (ref 30.0–36.0)
MCV: 87.2 fL (ref 78.0–100.0)
MONOS PCT: 6 % (ref 3–12)
Monocytes Absolute: 0.9 10*3/uL (ref 0.1–1.0)
NEUTROS ABS: 10 10*3/uL — AB (ref 1.7–7.7)
Neutrophils Relative %: 70 % (ref 43–77)
Platelets: 363 10*3/uL (ref 150–400)
RBC: 5.31 MIL/uL — ABNORMAL HIGH (ref 3.87–5.11)
RDW: 13.6 % (ref 11.5–15.5)
WBC: 14.3 10*3/uL — AB (ref 4.0–10.5)

## 2014-04-14 LAB — BASIC METABOLIC PANEL
ANION GAP: 11 (ref 5–15)
BUN: 12 mg/dL (ref 6–23)
CO2: 25 mmol/L (ref 19–32)
Calcium: 8.8 mg/dL (ref 8.4–10.5)
Chloride: 103 mmol/L (ref 96–112)
Creatinine, Ser: 0.81 mg/dL (ref 0.50–1.10)
Glucose, Bld: 99 mg/dL (ref 70–99)
POTASSIUM: 2.9 mmol/L — AB (ref 3.5–5.1)
Sodium: 139 mmol/L (ref 135–145)

## 2014-04-14 LAB — URINALYSIS, ROUTINE W REFLEX MICROSCOPIC
GLUCOSE, UA: NEGATIVE mg/dL
Ketones, ur: 15 mg/dL — AB
NITRITE: NEGATIVE
PH: 5 (ref 5.0–8.0)
Protein, ur: 30 mg/dL — AB
SPECIFIC GRAVITY, URINE: 1.025 (ref 1.005–1.030)
Urobilinogen, UA: 0.2 mg/dL (ref 0.0–1.0)

## 2014-04-14 LAB — URINE MICROSCOPIC-ADD ON

## 2014-04-14 LAB — RAPID URINE DRUG SCREEN, HOSP PERFORMED
Amphetamines: NOT DETECTED
BENZODIAZEPINES: POSITIVE — AB
Barbiturates: NOT DETECTED
COCAINE: NOT DETECTED
Opiates: POSITIVE — AB
TETRAHYDROCANNABINOL: POSITIVE — AB

## 2014-04-14 LAB — PREGNANCY, URINE: PREG TEST UR: NEGATIVE

## 2014-04-14 LAB — VALPROIC ACID LEVEL

## 2014-04-14 MED ORDER — POTASSIUM CHLORIDE CRYS ER 20 MEQ PO TBCR
40.0000 meq | EXTENDED_RELEASE_TABLET | Freq: Once | ORAL | Status: AC
  Administered 2014-04-14: 40 meq via ORAL
  Filled 2014-04-14: qty 2

## 2014-04-14 MED ORDER — DIVALPROEX SODIUM 250 MG PO DR TAB
500.0000 mg | DELAYED_RELEASE_TABLET | Freq: Two times a day (BID) | ORAL | Status: DC
  Administered 2014-04-14: 500 mg via ORAL
  Filled 2014-04-14: qty 2

## 2014-04-14 MED ORDER — SODIUM CHLORIDE 0.9 % IV BOLUS (SEPSIS)
1000.0000 mL | Freq: Once | INTRAVENOUS | Status: AC
  Administered 2014-04-14: 1000 mL via INTRAVENOUS

## 2014-04-14 MED ORDER — SODIUM CHLORIDE 0.9 % IV BOLUS (SEPSIS): 1000.0000 mL | Freq: Once | INTRAVENOUS | Status: DC

## 2014-04-14 MED ORDER — DIVALPROEX SODIUM 500 MG PO DR TAB: 500.0000 mg | DELAYED_RELEASE_TABLET | Freq: Two times a day (BID) | ORAL | Status: DC

## 2014-04-14 NOTE — Discharge Instructions (Signed)
Driving and Equipment Restrictions °Some medical problems make it dangerous to drive, ride a bike, or use machines. Some of these problems are: °· A hard blow to the head (concussion). °· Passing out (fainting). °· Twitching and shaking (seizures). °· Low blood sugar. °· Taking medicine to help you relax (sedatives). °· Taking pain medicines. °· Wearing an eye patch. °· Wearing splints. This can make it hard to use parts of your body that you need to drive safely. °HOME CARE  °· Do not drive until your doctor says it is okay. °· Do not use machines until your doctor says it is okay. °You may need a form signed by your doctor (medical release) before you can drive again. You may also need this form before you do other tasks where you need to be fully alert. °MAKE SURE YOU: °· Understand these instructions. °· Will watch your condition. °· Will get help right away if you are not doing well or get worse. °Document Released: 02/01/2004 Document Revised: 03/18/2011 Document Reviewed: 05/03/2009 °ExitCare® Patient Information ©2015 [employer]. This information is not intended to replace advice given to you by your health care provider. Make sure you discuss any questions you have with your health care provider. ° °

## 2014-04-14 NOTE — ED Notes (Signed)
Pt reports that she had new onset of seizures tonight, called neurologist and told to come to er
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
## 2014-04-18 LAB — I-STAT CHEM 8, ED
BUN: 10 mg/dL (ref 6–23)
Calcium, Ion: 1.16 mmol/L (ref 1.12–1.23)
Chloride: 102 mmol/L (ref 96–112)
Creatinine, Ser: 0.8 mg/dL (ref 0.50–1.10)
Glucose, Bld: 99 mg/dL (ref 70–99)
POTASSIUM: 3.1 mmol/L — AB (ref 3.5–5.1)
SODIUM: 143 mmol/L (ref 135–145)
TCO2: 23 mmol/L (ref 0–100)

## 2014-04-25 ENCOUNTER — Emergency Department (HOSPITAL_COMMUNITY)
Admission: EM | Admit: 2014-04-25 | Discharge: 2014-04-25 | Discharge status: Against Medical Advice | Payer: Medicare Other | Attending: Emergency Medicine | Admitting: Emergency Medicine

## 2014-04-25 ENCOUNTER — Encounter (HOSPITAL_COMMUNITY): Payer: Self-pay | Admitting: Emergency Medicine

## 2014-04-25 DIAGNOSIS — J45909 Unspecified asthma, uncomplicated: Secondary | ICD-10-CM | POA: Diagnosis not present

## 2014-04-25 DIAGNOSIS — Z720 Tobacco use: Secondary | ICD-10-CM | POA: Diagnosis not present

## 2014-04-25 DIAGNOSIS — G8929 Other chronic pain: Secondary | ICD-10-CM | POA: Diagnosis not present

## 2014-04-25 DIAGNOSIS — R569 Unspecified convulsions: Secondary | ICD-10-CM | POA: Insufficient documentation
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
## 2014-04-25 NOTE — ED Notes (Signed)
Pt reports that she had a seizure 30 min to 1 hour PTA. Pt does not appear post ictal in Triage.

## 2014-04-27 ENCOUNTER — Encounter (HOSPITAL_COMMUNITY): Payer: Self-pay

## 2014-04-27 ENCOUNTER — Emergency Department (HOSPITAL_COMMUNITY)
Admission: EM | Admit: 2014-04-27 | Discharge: 2014-04-27 | Discharge status: Against Medical Advice | Payer: Medicare Other | Attending: Emergency Medicine | Admitting: Emergency Medicine

## 2014-04-27 ENCOUNTER — Ambulatory Visit (HOSPITAL_COMMUNITY): Admission: RE | Admit: 2014-04-27 | Payer: Medicare Other | Source: Ambulatory Visit

## 2014-04-27 ENCOUNTER — Emergency Department (HOSPITAL_COMMUNITY): Payer: Medicare Other

## 2014-04-27 DIAGNOSIS — R109 Unspecified abdominal pain: Secondary | ICD-10-CM

## 2014-04-27 DIAGNOSIS — Z720 Tobacco use: Secondary | ICD-10-CM | POA: Diagnosis not present

## 2014-04-27 DIAGNOSIS — S0083XA Contusion of other part of head, initial encounter: Secondary | ICD-10-CM | POA: Insufficient documentation

## 2014-04-27 DIAGNOSIS — G35 Multiple sclerosis: Secondary | ICD-10-CM | POA: Diagnosis not present

## 2014-04-27 DIAGNOSIS — S0990XA Unspecified injury of head, initial encounter: Secondary | ICD-10-CM | POA: Diagnosis present

## 2014-04-27 DIAGNOSIS — G43909 Migraine, unspecified, not intractable, without status migrainosus: Secondary | ICD-10-CM | POA: Diagnosis not present

## 2014-04-27 DIAGNOSIS — S0093XA Contusion of unspecified part of head, initial encounter: Secondary | ICD-10-CM

## 2014-04-27 DIAGNOSIS — S3991XA Unspecified injury of abdomen, initial encounter: Secondary | ICD-10-CM | POA: Diagnosis not present

## 2014-04-27 DIAGNOSIS — Z3202 Encounter for pregnancy test, result negative: Secondary | ICD-10-CM | POA: Insufficient documentation

## 2014-04-27 DIAGNOSIS — K219 Gastro-esophageal reflux disease without esophagitis: Secondary | ICD-10-CM | POA: Insufficient documentation

## 2014-04-27 DIAGNOSIS — J45909 Unspecified asthma, uncomplicated: Secondary | ICD-10-CM | POA: Diagnosis not present

## 2014-04-27 DIAGNOSIS — F329 Major depressive disorder, single episode, unspecified: Secondary | ICD-10-CM | POA: Insufficient documentation

## 2014-04-27 DIAGNOSIS — Z8742 Personal history of other diseases of the female genital tract: Secondary | ICD-10-CM | POA: Insufficient documentation

## 2014-04-27 DIAGNOSIS — G8929 Other chronic pain: Secondary | ICD-10-CM | POA: Diagnosis not present

## 2014-04-27 DIAGNOSIS — Y92481 Parking lot as the place of occurrence of the external cause: Secondary | ICD-10-CM | POA: Diagnosis not present

## 2014-04-27 DIAGNOSIS — Y9301 Activity, walking, marching and hiking: Secondary | ICD-10-CM | POA: Diagnosis not present

## 2014-04-27 DIAGNOSIS — F410 Panic disorder [episodic paroxysmal anxiety] without agoraphobia: Secondary | ICD-10-CM | POA: Insufficient documentation

## 2014-04-27 DIAGNOSIS — Y998 Other external cause status: Secondary | ICD-10-CM | POA: Diagnosis not present

## 2014-04-27 LAB — URINALYSIS, ROUTINE W REFLEX MICROSCOPIC
Glucose, UA: NEGATIVE mg/dL
Ketones, ur: NEGATIVE mg/dL
LEUKOCYTES UA: NEGATIVE
NITRITE: NEGATIVE
PH: 6 (ref 5.0–8.0)
Protein, ur: 30 mg/dL — AB
Specific Gravity, Urine: >1.03 — ABNORMAL HIGH (ref 1.005–1.030)
UROBILINOGEN UA: 0.2 mg/dL (ref 0.0–1.0)

## 2014-04-27 LAB — CBC WITH DIFFERENTIAL/PLATELET
Basophils Absolute: 0 10*3/uL (ref 0.0–0.1)
Basophils Relative: 0 % (ref 0–1)
EOS ABS: 0.1 10*3/uL (ref 0.0–0.7)
EOS PCT: 1 % (ref 0–5)
HEMATOCRIT: 43.6 % (ref 36.0–46.0)
HEMOGLOBIN: 14.6 g/dL (ref 12.0–15.0)
LYMPHS ABS: 2.6 10*3/uL (ref 0.7–4.0)
LYMPHS PCT: 28 % (ref 12–46)
MCH: 30 pg (ref 26.0–34.0)
MCHC: 33.5 g/dL (ref 30.0–36.0)
MCV: 89.7 fL (ref 78.0–100.0)
MONO ABS: 0.4 10*3/uL (ref 0.1–1.0)
MONOS PCT: 5 % (ref 3–12)
NEUTROS ABS: 6.1 10*3/uL (ref 1.7–7.7)
Neutrophils Relative %: 66 % (ref 43–77)
Platelets: 347 10*3/uL (ref 150–400)
RBC: 4.86 MIL/uL (ref 3.87–5.11)
RDW: 13.4 % (ref 11.5–15.5)
WBC: 9.3 10*3/uL (ref 4.0–10.5)

## 2014-04-27 LAB — COMPREHENSIVE METABOLIC PANEL
ALT: 14 U/L (ref 0–35)
AST: 17 U/L (ref 0–37)
Albumin: 3.6 g/dL (ref 3.5–5.2)
Alkaline Phosphatase: 86 U/L (ref 39–117)
Anion gap: 6 (ref 5–15)
BILIRUBIN TOTAL: 0.4 mg/dL (ref 0.3–1.2)
BUN: 8 mg/dL (ref 6–23)
CALCIUM: 8.5 mg/dL (ref 8.4–10.5)
CHLORIDE: 107 mmol/L (ref 96–112)
CO2: 25 mmol/L (ref 19–32)
Creatinine, Ser: 0.69 mg/dL (ref 0.50–1.10)
Glucose, Bld: 85 mg/dL (ref 70–99)
Potassium: 4 mmol/L (ref 3.5–5.1)
SODIUM: 138 mmol/L (ref 135–145)
Total Protein: 6.7 g/dL (ref 6.0–8.3)

## 2014-04-27 LAB — URINE MICROSCOPIC-ADD ON

## 2014-04-27 LAB — PREGNANCY, URINE: Preg Test, Ur: NEGATIVE

## 2014-04-27 NOTE — ED Notes (Signed)
Cecille Rubin, RN and [place], ED tech in to speak with patient

## 2014-04-27 NOTE — ED Notes (Signed)
Patient ambulated to nursing station and stated she wanted to let the doctor know that she had changed neurologists. States new doctors names are Dr Donnajean Lopes and Dr Shelia Media with [facility].
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
## 2014-04-27 NOTE — ED Notes (Signed)
Patient's female visitor to nursing station stating patient "wants IV out" and "wants to leave because nothing has been done for her". I offered to have the patient's nurse or doctor step in, he refused again stating "she wants to leave", I explained to him that she would be leaving against medical advice, he said "fine". Brooke, ED tech in to d'c patient's IV, Cecille Rubin, patient's primary nurse informed.

## 2014-04-27 NOTE — ED Notes (Addendum)
Patient states she wants to leave. States "I'm allergic to tylenol and ibuprofen doesn't help my pain since I'm on my MS meds. If he's not going to give me narcotic pain medicine then I just want to leave and go home and call my neurologist and see what he can do." Patient verbalized understanding of leaving against medical advice. Patient ambulated to exit with no assistance and no difficulty. Patient's significant other at side. Dr Lacinda Axon notified.

## 2014-05-02 ENCOUNTER — Encounter (HOSPITAL_COMMUNITY): Payer: Self-pay | Admitting: *Deleted

## 2014-05-02 ENCOUNTER — Emergency Department (HOSPITAL_COMMUNITY)
Admission: EM | Admit: 2014-05-02 | Discharge: 2014-05-02 | Discharge status: Against Medical Advice | Payer: Medicare Other | Attending: Emergency Medicine | Admitting: Emergency Medicine

## 2014-05-02 DIAGNOSIS — J45909 Unspecified asthma, uncomplicated: Secondary | ICD-10-CM | POA: Diagnosis not present

## 2014-05-02 DIAGNOSIS — R51 Headache: Secondary | ICD-10-CM | POA: Diagnosis present

## 2014-05-02 DIAGNOSIS — Z720 Tobacco use: Secondary | ICD-10-CM | POA: Diagnosis not present

## 2014-05-02 DIAGNOSIS — R569 Unspecified convulsions: Secondary | ICD-10-CM | POA: Diagnosis not present

## 2014-05-02 DIAGNOSIS — G8929 Other chronic pain: Secondary | ICD-10-CM | POA: Insufficient documentation

## 2014-05-02 NOTE — ED Notes (Signed)
Pt left without being seen, pt sts "I'm claustrophobic, I can't do this anymore."

## 2014-05-02 NOTE — ED Notes (Addendum)
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 pcp assumed pt had a pseudoseizure, so he called ems. Pt was being seen by pcp for migraine. pcp gave phenergan and depo-medrol.   Upon rn assessment, pt reports she had pseudoseizure, was being seen at pcp for headache. Pain 10/10. Reports hands shaking, hx of MS.

## 2014-05-02 NOTE — ED Notes (Signed)
Bed: BH41 Expected date:  Expected time:  Means of arrival:  Comments: EMS seziure

## 2014-05-08 ENCOUNTER — Emergency Department (HOSPITAL_COMMUNITY)
Admission: EM | Admit: 2014-05-08 | Discharge: 2014-05-09 | Discharge status: Against Medical Advice | Payer: Medicare Other | Attending: Emergency Medicine | Admitting: Emergency Medicine

## 2014-05-08 ENCOUNTER — Encounter (HOSPITAL_COMMUNITY): Payer: Self-pay | Admitting: Emergency Medicine

## 2014-05-08 DIAGNOSIS — K219 Gastro-esophageal reflux disease without esophagitis: Secondary | ICD-10-CM | POA: Diagnosis not present

## 2014-05-08 DIAGNOSIS — G43909 Migraine, unspecified, not intractable, without status migrainosus: Secondary | ICD-10-CM | POA: Insufficient documentation

## 2014-05-08 DIAGNOSIS — G35 Multiple sclerosis: Secondary | ICD-10-CM | POA: Insufficient documentation

## 2014-05-08 DIAGNOSIS — J45909 Unspecified asthma, uncomplicated: Secondary | ICD-10-CM | POA: Diagnosis not present

## 2014-05-08 DIAGNOSIS — R569 Unspecified convulsions: Secondary | ICD-10-CM | POA: Diagnosis present

## 2014-05-08 DIAGNOSIS — F329 Major depressive disorder, single episode, unspecified: Secondary | ICD-10-CM | POA: Diagnosis not present

## 2014-05-08 DIAGNOSIS — F410 Panic disorder [episodic paroxysmal anxiety] without agoraphobia: Secondary | ICD-10-CM | POA: Diagnosis not present

## 2014-05-08 DIAGNOSIS — Z79899 Other long term (current) drug therapy: Secondary | ICD-10-CM | POA: Insufficient documentation

## 2014-05-08 DIAGNOSIS — R51 Headache: Secondary | ICD-10-CM

## 2014-05-08 DIAGNOSIS — R55 Syncope and collapse: Secondary | ICD-10-CM | POA: Diagnosis not present

## 2014-05-08 DIAGNOSIS — G8929 Other chronic pain: Secondary | ICD-10-CM | POA: Diagnosis not present

## 2014-05-08 DIAGNOSIS — Z8742 Personal history of other diseases of the female genital tract: Secondary | ICD-10-CM | POA: Insufficient documentation

## 2014-05-08 DIAGNOSIS — Z720 Tobacco use: Secondary | ICD-10-CM | POA: Diagnosis not present

## 2014-05-08 DIAGNOSIS — F445 Conversion disorder with seizures or convulsions: Secondary | ICD-10-CM

## 2014-05-08 DIAGNOSIS — R519 Headache, unspecified: Secondary | ICD-10-CM

## 2014-05-08 MED ORDER — LORAZEPAM 2 MG/ML IJ SOLN
1.0000 mg | Freq: Once | INTRAMUSCULAR | Status: AC
  Administered 2014-05-08: 1 mg via INTRAVENOUS
  Filled 2014-05-08: qty 1

## 2014-05-08 MED ORDER — METOCLOPRAMIDE HCL 5 MG/ML IJ SOLN
10.0000 mg | Freq: Once | INTRAMUSCULAR | Status: AC
  Administered 2014-05-09: 10 mg via INTRAVENOUS
  Filled 2014-05-08: qty 2

## 2014-05-08 MED ORDER — SODIUM CHLORIDE 0.9 % IV SOLN
1000.0000 mL | INTRAVENOUS | Status: DC
  Administered 2014-05-08: 1000 mL via INTRAVENOUS

## 2014-05-08 MED ORDER — SODIUM CHLORIDE 0.9 % IV SOLN
1000.0000 mL | Freq: Once | INTRAVENOUS | Status: AC
  Administered 2014-05-08: 1000 mL via INTRAVENOUS

## 2014-05-08 MED ORDER — DIPHENHYDRAMINE HCL 50 MG/ML IJ SOLN
25.0000 mg | Freq: Once | INTRAMUSCULAR | Status: AC
  Administered 2014-05-08: 25 mg via INTRAVENOUS
  Filled 2014-05-08: qty 1

## 2014-05-08 NOTE — ED Provider Notes (Signed)
CSN: [id number]     Arrival date & time 05/08/14  2149 History   First MD Initiated Contact with Patient 05/08/14 2312     Chief Complaint  Patient presents with  . Seizures     (Consider location/radiation/quality/duration/timing/severity/associated sxs/prior Treatment) Patient is a 34 y.o. female presenting with seizures. The history is provided by the patient.  Seizures She comes to the ED because episode of syncope and ongoing problems with headaches and pseudoseizures. It is very difficult to get a history from her as she rambles extensively but apparently has history of multiple sclerosis and has been diagnosed with JC disease as well. She has had multiple episodes of syncope and has been diagnosed with pseudoseizures. She has chronic problems with headaches and is scheduled to be seen at a pain management clinic. She actually missed an appointment 2 days ago for initial evaluation and she is planning to call tomorrow for purposes of rescheduling. They had several episodes where she passed out today with some questions of ongoing problems with pseudoseizures. She claims compliance with her medications. She is complaining of palpitations and feeling like her heart is jumping out of her chest. She is complaining of lightheadedness, facial numbness, numbness in her hands and feet. She relates that she was seen in the emergency department at [facility] yesterday and that the physician there is helping her.  Past Medical History  Diagnosis Date  . Headache(784.0)     migraines  . Multiple sclerosis   . Asthma as a child  . GERD (gastroesophageal reflux disease)   . Urinary urgency   . Anxiety   . Perforated bowel 2009  . Migraines   . Depression   . Chronic back pain   . Chronic pain   . Ovarian cyst   . Panic attack    Past Surgical History  Procedure Laterality Date  . Extremity cyst excision  1994    right leg  . 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  Colostomy closure  04/2007  . Scar revision  01/21/2011    Procedure: SCAR REVISION;  Surgeon: Hermelinda Dellen;  Location: [place];  Service: Plastics;  Laterality: N/A;  exploration of scar of abdomen and repair of defect  . Hernia repair    . Abdominal surgery    . Appendectomy     Family History  Problem Relation Age of Onset  . Diabetes Mother   . Hypertension Mother   . Diabetes Father   . Hypertension Father    History  Substance Use Topics  . Smoking status: Current Some Day Smoker -- 0.40 packs/day for 10 years    Types: Cigarettes  . Smokeless tobacco: Never Used     Comment: quit smoking 10 days ago  . Alcohol Use: No     Comment: Rare   OB History    No data available     Review of Systems  Neurological: Positive for seizures.  All other systems reviewed and are negative.     Allergies  Amitriptyline; Baclofen; Cymbalta; Gabapentin; Monosodium glutamate; Vicodin; Alprazolam; Magnesium salicylate; Rizatriptan; Tizanidine; Toradol; Tramadol; Adhesive; and Lamotrigine  Home Medications   Prior to Admission medications   Medication Sig Start Date End Date Taking? Authorizing Provider  Alum & Mag Hydroxide-Simeth (MAGIC MOUTHWASH W/LIDOCAINE) SOLN Take 5 mLs by mouth 3 (three) times daily as needed (For reflux pain). Patient not taking: Reported on 03/07/2014 10/24/13   Tanna Furry, MD  celecoxib (CELEBREX)  100 MG capsule Take 1 capsule (100 mg total) by mouth 2 (two) times daily. 11/01/13   Lily Kocher, PA-C  chlorthalidone (HYGROTON) 25 MG tablet Take 1 tablet (25 mg total) by mouth daily. Patient not taking: Reported on 03/07/2014 11/01/83   Delora Fuel, MD  cyclobenzaprine (FLEXERIL) 10 MG tablet Take 10 mg by mouth 3 (three) times daily as needed for muscle spasms (muscle spasms).     Historical Provider, MD  diphenhydrAMINE (BENADRYL) 25 MG tablet Take 50 mg by mouth every 8 (eight) hours as needed for sleep.     Historical Provider, MD   divalproex (DEPAKOTE) 500 MG DR tablet Take 1 tablet (500 mg total) by mouth 2 (two) times daily. 04/14/14   April Palumbo, MD  EPINEPHrine (EPIPEN) 0.3 mg/0.3 mL SOAJ injection Inject 0.3 mg into the muscle as needed (allergic reaction).     Historical Provider, MD  Fingolimod HCl (GILENYA) 0.5 MG CAPS Take 0.5 mg by mouth daily.     Historical Provider, MD  hydrOXYzine (ATARAX/VISTARIL) 25 MG tablet Take 1 tablet (25 mg total) by mouth every 6 (six) hours. 02/05/14   Nicole Pisciotta, PA-C  lactulose (CHRONULAC) 10 GM/15ML solution Take 15 mLs (10 g total) by mouth 2 (two) times daily as needed for mild constipation or moderate constipation. Patient not taking: Reported on 03/07/2014 12/06/13   Daleen Bo, MD  loratadine (CLARITIN) 10 MG tablet Take 10 mg by mouth daily.     Historical Provider, MD  omeprazole (PRILOSEC) 40 MG capsule Take 40 mg by mouth daily.     Historical Provider, MD  ondansetron (ZOFRAN) 8 MG tablet Take 8 mg by mouth every 6 (six) hours as needed for nausea or vomiting (nausea).     Historical Provider, MD  oxyCODONE (ROXICODONE) 15 MG immediate release tablet Take 15 mg by mouth every 8 (eight) hours as needed for pain (pain).     Historical Provider, MD  potassium chloride (K-DUR) 10 MEQ tablet Take 10 mEq by mouth 2 (two) times daily.    Historical Provider, MD  pregabalin (LYRICA) 200 MG capsule Take 600 mg by mouth at bedtime.    Historical Provider, MD  promethazine (PHENERGAN) 25 MG tablet Take 25 mg by mouth every 6 (six) hours as needed for nausea or vomiting.     Historical Provider, MD  propranolol (INDERAL) 60 MG tablet Take 1 tablet (60 mg total) by mouth 2 (two) times daily. 11/23/13   Penni Bombard, MD  ranitidine (ZANTAC) 300 MG capsule Take 300 mg by mouth at bedtime.     Historical Provider, MD  topiramate (TOPAMAX) 100 MG tablet Take 100 mg by mouth 2 (two) times daily.     Historical Provider, MD  venlafaxine (EFFEXOR) 75 MG tablet Take 75 mg by  mouth 3 (three) times daily with meals.    Historical Provider, MD   BP 158/99 mmHg  Pulse 129  Temp(Src) 98.6 F (37 C) (Oral)  Resp 20  Ht 5\' 5"  (1.651 m)  Wt 215 lb (97.523 kg)  BMI 35.78 kg/m2  SpO2 100%  LMP 05/07/2014 Physical Exam  Nursing note and vitals reviewed.  34 year old female, resting comfortably and in no acute distress. Vital signs are significant for tachycardia and hypertension. Oxygen saturation is 100%, which is normal. She is obviously hyperventilating. Head is normocephalic and atraumatic. PERRLA, EOMI. fundi show no hemorrhage, exudate, or papilledema. Oropharynx is clear. Neck is nontender and supple without adenopathy or JVD. Back is nontender  and there is no CVA tenderness. Lungs are clear without rales, wheezes, or rhonchi. Chest is nontender. Heart has regular rate and rhythm without murmur. Abdomen is soft, flat, nontender without masses or hepatosplenomegaly and peristalsis is normoactive. Extremities have no cyanosis or edema, full range of motion is present. Skin is warm and dry without rash. Neurologic: Speech is normal but rambling with almost stream of consciousness, cranial nerves are intact, there are no motor or sensory deficits.  ED Course  Procedures (including critical care time) Labs Review Results for orders placed or performed during the hospital encounter of 57/84/69  Basic metabolic panel  Result Value Ref Range   Sodium 139 135 - 145 mmol/L   Potassium 3.6 3.5 - 5.1 mmol/L   Chloride 113 (H) 101 - 111 mmol/L   CO2 19 (L) 22 - 32 mmol/L   Glucose, Bld 95 70 - 99 mg/dL   BUN 9 6 - 20 mg/dL   Creatinine, Ser 0.62 0.44 - 1.00 mg/dL   Calcium 8.2 (L) 8.9 - 10.3 mg/dL   GFR calc non Af Amer >60 >60 mL/min   GFR calc Af Amer >60 >60 mL/min   Anion gap 7 5 - 15  CBC with Differential  Result Value Ref Range   WBC 9.6 4.0 - 10.5 K/uL   RBC 4.71 3.87 - 5.11 MIL/uL   Hemoglobin 14.1 12.0 - 15.0 g/dL   HCT 42.0 36.0 - 46.0 %   MCV  89.2 78.0 - 100.0 fL   MCH 29.9 26.0 - 34.0 pg   MCHC 33.6 30.0 - 36.0 g/dL   RDW 13.8 11.5 - 15.5 %   Platelets 347 150 - 400 K/uL   Neutrophils Relative % 78 (H) 43 - 77 %   Neutro Abs 7.5 1.7 - 7.7 K/uL   Lymphocytes Relative 15 12 - 46 %   Lymphs Abs 1.4 0.7 - 4.0 K/uL   Monocytes Relative 5 3 - 12 %   Monocytes Absolute 0.5 0.1 - 1.0 K/uL   Eosinophils Relative 2 0 - 5 %   Eosinophils Absolute 0.2 0.0 - 0.7 K/uL   Basophils Relative 0 0 - 1 %   Basophils Absolute 0.0 0.0 - 0.1 K/uL  Valproic acid level  Result Value Ref Range   Valproic Acid Lvl <10 (L) 50.0 - 100.0 ug/mL    MDM   Final diagnoses:  Headache, unspecified headache type  Syncope, unspecified syncope type  Pseudoseizures    Headache and syncope with pseudoseizures. Old records are reviewed and these are all ongoing problems with her. She also has been diagnosed with conversion disorder. She has several ED visits here and also at [facility] in addition to the visit that she stated she had a.[place]. Today, the main hard finding is her tachycardia. She will be given a headache cocktail of IV fluids, metoclopramide, diphenhydramine and she will also be given a dose of lorazepam. Review of her records on the [place] controlled substance reporting website shows that she gets 90 tablets of oxycodone 15 mg and 90 tablets of clonazepam 1 mg once a month. She also had an additional prescription for hydromorphone 2 mg 20 tablets on April 4. Valproic acid level will be checked.  Valproic acid level has come back undetectable, suspicious for medication noncompliance. Patient had demanded that her IV be pulled out and left before I was aware. She had communicated to nurse that she had expected narcotics for her pain. She left before I was able  to talk with her.  Delora Fuel, MD 42/39/53 2023

## 2014-05-08 NOTE — ED Notes (Signed)
Patient with multiple complaints. Reports "I have conversion disorder and I have had 8 seizures tonight because of the conversion disorder." Patient with slurred speech in triage. When patient was rolled into triage patient was slumped over in wheelchair and was not responding to staff. Patient's arms were limp. Patient responded to painful stimuli and immediately started talking and telling staff about her conversion disorder and how she had just spoke with nurse who told her to come to ED to be seen.

## 2014-05-08 NOTE — ED Notes (Signed)
Patient states she was diagnosed recently with pseudoseizures. Patient states she has to go to multiple hospitals for "help" patient is unable to define what "help" she was getting from multiple hospital visits. Patient states she has an appointment tomorrow with the pain clinic at [facility].

## 2014-05-09 DIAGNOSIS — R569 Unspecified convulsions: Secondary | ICD-10-CM | POA: Diagnosis not present

## 2014-05-09 LAB — CBC WITH DIFFERENTIAL/PLATELET
BASOS PCT: 0 % (ref 0–1)
Basophils Absolute: 0 10*3/uL (ref 0.0–0.1)
EOS PCT: 2 % (ref 0–5)
Eosinophils Absolute: 0.2 10*3/uL (ref 0.0–0.7)
HCT: 42 % (ref 36.0–46.0)
HEMOGLOBIN: 14.1 g/dL (ref 12.0–15.0)
LYMPHS ABS: 1.4 10*3/uL (ref 0.7–4.0)
Lymphocytes Relative: 15 % (ref 12–46)
MCH: 29.9 pg (ref 26.0–34.0)
MCHC: 33.6 g/dL (ref 30.0–36.0)
MCV: 89.2 fL (ref 78.0–100.0)
Monocytes Absolute: 0.5 10*3/uL (ref 0.1–1.0)
Monocytes Relative: 5 % (ref 3–12)
Neutro Abs: 7.5 10*3/uL (ref 1.7–7.7)
Neutrophils Relative %: 78 % — ABNORMAL HIGH (ref 43–77)
PLATELETS: 347 10*3/uL (ref 150–400)
RBC: 4.71 MIL/uL (ref 3.87–5.11)
RDW: 13.8 % (ref 11.5–15.5)
WBC: 9.6 10*3/uL (ref 4.0–10.5)

## 2014-05-09 LAB — BASIC METABOLIC PANEL
ANION GAP: 7 (ref 5–15)
BUN: 9 mg/dL (ref 6–20)
CALCIUM: 8.2 mg/dL — AB (ref 8.9–10.3)
CHLORIDE: 113 mmol/L — AB (ref 101–111)
CO2: 19 mmol/L — AB (ref 22–32)
Creatinine, Ser: 0.62 mg/dL (ref 0.44–1.00)
GLUCOSE: 95 mg/dL (ref 70–99)
POTASSIUM: 3.6 mmol/L (ref 3.5–5.1)
Sodium: 139 mmol/L (ref 135–145)

## 2014-05-09 LAB — VALPROIC ACID LEVEL: Valproic Acid Lvl: <10 ug/mL — ABNORMAL LOW (ref 50.0–100.0)

## 2014-05-09 NOTE — ED Notes (Signed)
Patient verbalizes understanding about AMA process. Patient ambulatory out of department at this time with family member

## 2014-05-09 NOTE — ED Notes (Signed)
Called into patients room. Patient tearful stating "yall need to call that Dr. At [facility] [address] and see what she gave me, because it helped me" explained to patient that we are waiting for the edp here to review her lab work and continue care. Patient states "im just going to go, take this IV out" i explained to patient about leaving AMA and that she is free to refuse medical care. Patient states understanding. Patients boyfriend entered room at this time, and helped patient get dressed. Patient is ambulatory out of department at this time.

## 2014-05-23 ENCOUNTER — Emergency Department (HOSPITAL_COMMUNITY)
Admission: EM | Admit: 2014-05-23 | Discharge: 2014-05-23 | Disposition: A | Discharge status: Home / Self Care | Payer: Medicare Other | Attending: Emergency Medicine | Admitting: Emergency Medicine

## 2014-05-23 ENCOUNTER — Encounter (HOSPITAL_COMMUNITY): Payer: Self-pay | Admitting: Emergency Medicine

## 2014-05-23 DIAGNOSIS — Z8739 Personal history of other diseases of the musculoskeletal system and connective tissue: Secondary | ICD-10-CM | POA: Insufficient documentation

## 2014-05-23 DIAGNOSIS — Z720 Tobacco use: Secondary | ICD-10-CM | POA: Insufficient documentation

## 2014-05-23 DIAGNOSIS — Z3202 Encounter for pregnancy test, result negative: Secondary | ICD-10-CM | POA: Diagnosis not present

## 2014-05-23 DIAGNOSIS — G8929 Other chronic pain: Secondary | ICD-10-CM | POA: Insufficient documentation

## 2014-05-23 DIAGNOSIS — J45909 Unspecified asthma, uncomplicated: Secondary | ICD-10-CM | POA: Insufficient documentation

## 2014-05-23 DIAGNOSIS — F419 Anxiety disorder, unspecified: Secondary | ICD-10-CM

## 2014-05-23 DIAGNOSIS — F329 Major depressive disorder, single episode, unspecified: Secondary | ICD-10-CM | POA: Diagnosis not present

## 2014-05-23 DIAGNOSIS — Z8742 Personal history of other diseases of the female genital tract: Secondary | ICD-10-CM | POA: Insufficient documentation

## 2014-05-23 DIAGNOSIS — K219 Gastro-esophageal reflux disease without esophagitis: Secondary | ICD-10-CM | POA: Insufficient documentation

## 2014-05-23 DIAGNOSIS — G43909 Migraine, unspecified, not intractable, without status migrainosus: Secondary | ICD-10-CM | POA: Insufficient documentation

## 2014-05-23 DIAGNOSIS — R000 Tachycardia, unspecified: Secondary | ICD-10-CM | POA: Insufficient documentation

## 2014-05-23 DIAGNOSIS — Z79899 Other long term (current) drug therapy: Secondary | ICD-10-CM | POA: Diagnosis not present

## 2014-05-23 DIAGNOSIS — R519 Headache, unspecified: Secondary | ICD-10-CM

## 2014-05-23 DIAGNOSIS — R51 Headache: Secondary | ICD-10-CM

## 2014-05-23 DIAGNOSIS — Z791 Long term (current) use of non-steroidal anti-inflammatories (NSAID): Secondary | ICD-10-CM | POA: Diagnosis not present

## 2014-05-23 DIAGNOSIS — R110 Nausea: Secondary | ICD-10-CM | POA: Diagnosis not present

## 2014-05-23 LAB — VALPROIC ACID LEVEL

## 2014-05-23 LAB — CBC WITH DIFFERENTIAL/PLATELET
BASOS ABS: 0 10*3/uL (ref 0.0–0.1)
BASOS PCT: 0 % (ref 0–1)
EOS ABS: 0 10*3/uL (ref 0.0–0.7)
EOS PCT: 1 % (ref 0–5)
HEMATOCRIT: 43.1 % (ref 36.0–46.0)
HEMOGLOBIN: 14.5 g/dL (ref 12.0–15.0)
Lymphocytes Relative: 18 % (ref 12–46)
Lymphs Abs: 1.4 10*3/uL (ref 0.7–4.0)
MCH: 30 pg (ref 26.0–34.0)
MCHC: 33.6 g/dL (ref 30.0–36.0)
MCV: 89 fL (ref 78.0–100.0)
Monocytes Absolute: 0.5 10*3/uL (ref 0.1–1.0)
Monocytes Relative: 6 % (ref 3–12)
NEUTROS PCT: 75 % (ref 43–77)
Neutro Abs: 5.8 10*3/uL (ref 1.7–7.7)
PLATELETS: 356 10*3/uL (ref 150–400)
RBC: 4.84 MIL/uL (ref 3.87–5.11)
RDW: 14 % (ref 11.5–15.5)
WBC: 7.6 10*3/uL (ref 4.0–10.5)

## 2014-05-23 LAB — URINALYSIS, ROUTINE W REFLEX MICROSCOPIC
Bilirubin Urine: NEGATIVE
GLUCOSE, UA: NEGATIVE mg/dL
Hgb urine dipstick: NEGATIVE
Ketones, ur: NEGATIVE mg/dL
LEUKOCYTES UA: NEGATIVE
NITRITE: NEGATIVE
PROTEIN: NEGATIVE mg/dL
Specific Gravity, Urine: 1.02 (ref 1.005–1.030)
Urobilinogen, UA: 0.2 mg/dL (ref 0.0–1.0)
pH: 7 (ref 5.0–8.0)

## 2014-05-23 LAB — BASIC METABOLIC PANEL
ANION GAP: 9 (ref 5–15)
BUN: 6 mg/dL (ref 6–20)
CALCIUM: 9 mg/dL (ref 8.9–10.3)
CO2: 25 mmol/L (ref 22–32)
CREATININE: 0.64 mg/dL (ref 0.44–1.00)
Chloride: 106 mmol/L (ref 101–111)
GFR calc Af Amer: >60 mL/min (ref >60)
Glucose, Bld: 96 mg/dL (ref 65–99)
Potassium: 3.8 mmol/L (ref 3.5–5.1)
Sodium: 140 mmol/L (ref 135–145)

## 2014-05-23 LAB — PREGNANCY, URINE: PREG TEST UR: NEGATIVE

## 2014-05-23 LAB — TROPONIN I

## 2014-05-23 MED ORDER — PROMETHAZINE HCL 25 MG/ML IJ SOLN
25.0000 mg | Freq: Once | INTRAMUSCULAR | Status: AC
  Administered 2014-05-23: 25 mg via INTRAVENOUS
  Filled 2014-05-23: qty 1

## 2014-05-23 MED ORDER — SODIUM CHLORIDE 0.9 % IV BOLUS (SEPSIS)
1000.0000 mL | Freq: Once | INTRAVENOUS | Status: AC
  Administered 2014-05-23: 1000 mL via INTRAVENOUS

## 2014-05-23 MED ORDER — ONDANSETRON HCL 4 MG/2ML IJ SOLN
4.0000 mg | Freq: Once | INTRAMUSCULAR | Status: AC
  Administered 2014-05-23: 4 mg via INTRAVENOUS
  Filled 2014-05-23: qty 2

## 2014-05-23 MED ORDER — VALPROATE SODIUM 500 MG/5ML IV SOLN
500.0000 mg | Freq: Once | INTRAVENOUS | Status: AC
  Administered 2014-05-23: 500 mg via INTRAVENOUS
  Filled 2014-05-23: qty 5

## 2014-05-23 MED ORDER — LORAZEPAM 2 MG/ML IJ SOLN
1.0000 mg | Freq: Once | INTRAMUSCULAR | Status: AC
  Administered 2014-05-23: 1 mg via INTRAVENOUS
  Filled 2014-05-23: qty 1

## 2014-05-23 NOTE — ED Provider Notes (Signed)
CSN: [id number]     Arrival date & time 05/23/14  0865 History  This chart was scribed for Emily Essex, MD by Thea Alken, ED Scribe. This patient was seen in room APA04/APA04 and the patient's care was started at 9:22 AM.   Chief Complaint  Patient presents with  . Anxiety   The history is provided by the patient. No language interpreter was used.   HPI Comments:  Emily [name] is a 34 y.o. female with hx of seizures and MS who present to the Emergency Department complaining of anxiety. Pt reports recent at home stress that has triggered her anxiety. She reports gradual onset of a worsening left sided migraine for 2 days. She reports palpitation of her heart pounding, nausea and dizziness. Pt takes klonopin for anxiety, Topamax for HA and states she took both this morning. Pt takes oxycodone for back pain. She denies SI/HI. Pt denies fever, emesis, CP, SOB weakness in arms and legs. Pt denies hx of lung and heart problems.  Allergies  Allergen Reactions  . Amitriptyline Hypertension and Other (See Comments)    hypertension  . Baclofen Hives and Shortness Of Breath  . Cymbalta [Duloxetine Hcl] Shortness Of Breath and Rash  . Gabapentin Shortness Of Breath and Rash  . Monosodium Glutamate Anaphylaxis  . Vicodin [Hydrocodone-Acetaminophen] Hives and Nausea And Vomiting    Projectile vomiting  . Alprazolam Other (See Comments)    Lethargy  . Magnesium Salicylate Hives and Itching  . Rizatriptan Nausea And Vomiting and Other (See Comments)    GI upset, Projectile vomiting  . Tizanidine Hives  . Toradol [Ketorolac Tromethamine] Nausea Only  . Tramadol Other (See Comments)  . Adhesive [Tape] Other (See Comments)    Skin irritation  . Lamotrigine Rash      Past Medical History  Diagnosis Date  . Headache(784.0)     migraines  . Multiple sclerosis   . Asthma as a child  . GERD (gastroesophageal reflux disease)   . Urinary urgency   . Anxiety   . Perforated bowel 2009  .  Migraines   . Depression   . Chronic back pain   . Chronic pain   . Ovarian cyst   . Panic attack    Past Surgical History  Procedure Laterality Date  . Extremity cyst excision  1994    right leg  . 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 Colostomy closure  04/2007  . Scar revision  01/21/2011    Procedure: SCAR REVISION;  Surgeon: Hermelinda Dellen;  Location: [place];  Service: Plastics;  Laterality: N/A;  exploration of scar of abdomen and repair of defect  . Hernia repair    . Abdominal surgery    . Appendectomy     Family History  Problem Relation Age of Onset  . Diabetes Mother   . Hypertension Mother   . Diabetes Father   . Hypertension Father    History  Substance Use Topics  . Smoking status: Current Some Day Smoker -- 1.00 packs/day for 10 years    Types: Cigarettes  . Smokeless tobacco: Never Used  . Alcohol Use: No     Comment: Rare   OB History    No data available     Review of Systems  Constitutional: Negative for fever.  Respiratory: Negative for shortness of breath.   Cardiovascular: Positive for palpitations. Negative for chest pain.  Gastrointestinal: Positive for nausea. Negative for vomiting.  Neurological:  Positive for dizziness and headaches. Negative for weakness and numbness.  A complete 10 system review of systems was obtained and all systems are negative except as noted in the HPI and PMH.   Allergies  Amitriptyline; Baclofen; Cymbalta; Gabapentin; Monosodium glutamate; Vicodin; Alprazolam; Magnesium salicylate; Rizatriptan; Tizanidine; Toradol; Tramadol; Adhesive; and Lamotrigine  Home Medications   Prior to Admission medications   Medication Sig Start Date End Date Taking? Authorizing Provider  celecoxib (CELEBREX) 100 MG capsule Take 1 capsule (100 mg total) by mouth 2 (two) times daily. 11/01/13  Yes Lily Kocher, PA-C  cyclobenzaprine (FLEXERIL) 10 MG tablet Take 10 mg by mouth 3 (three) times daily as  needed for muscle spasms (muscle spasms).    Yes Historical Provider, MD  diphenhydrAMINE (BENADRYL) 25 MG tablet Take 50 mg by mouth every 8 (eight) hours as needed for sleep.    Yes Historical Provider, MD  divalproex (DEPAKOTE) 500 MG DR tablet Take 1 tablet (500 mg total) by mouth 2 (two) times daily. 04/14/14  Yes April Palumbo, MD  EPINEPHrine (EPIPEN) 0.3 mg/0.3 mL SOAJ injection Inject 0.3 mg into the muscle as needed (allergic reaction).    Yes Historical Provider, MD  Fingolimod HCl (GILENYA) 0.5 MG CAPS Take 0.5 mg by mouth daily.    Yes Historical Provider, MD  hydrOXYzine (ATARAX/VISTARIL) 25 MG tablet Take 1 tablet (25 mg total) by mouth every 6 (six) hours. 02/05/14  Yes Nicole Pisciotta, PA-C  loratadine (CLARITIN) 10 MG tablet Take 10 mg by mouth daily.    Yes Historical Provider, MD  omeprazole (PRILOSEC) 40 MG capsule Take 40 mg by mouth daily.    Yes Historical Provider, MD  ondansetron (ZOFRAN) 8 MG tablet Take 8 mg by mouth every 6 (six) hours as needed for nausea or vomiting (nausea).    Yes Historical Provider, MD  oxyCODONE (ROXICODONE) 15 MG immediate release tablet Take 15 mg by mouth every 8 (eight) hours as needed for pain (pain).    Yes Historical Provider, MD  potassium chloride (K-DUR) 10 MEQ tablet Take 10 mEq by mouth 2 (two) times daily.   Yes Historical Provider, MD  pregabalin (LYRICA) 200 MG capsule Take 600 mg by mouth at bedtime.   Yes Historical Provider, MD  promethazine (PHENERGAN) 25 MG tablet Take 25 mg by mouth every 6 (six) hours as needed for nausea or vomiting.    Yes Historical Provider, MD  propranolol (INDERAL) 60 MG tablet Take 1 tablet (60 mg total) by mouth 2 (two) times daily. 11/23/13  Yes Penni Bombard, MD  ranitidine (ZANTAC) 300 MG capsule Take 300 mg by mouth at bedtime.    Yes Historical Provider, MD  topiramate (TOPAMAX) 100 MG tablet Take 100 mg by mouth 2 (two) times daily.    Yes Historical Provider, MD  venlafaxine (EFFEXOR) 75 MG  tablet Take 75 mg by mouth 3 (three) times daily with meals.   Yes Historical Provider, MD  Alum & Mag Hydroxide-Simeth (MAGIC MOUTHWASH W/LIDOCAINE) SOLN Take 5 mLs by mouth 3 (three) times daily as needed (For reflux pain). Patient not taking: Reported on 03/07/2014 10/24/13   Tanna Furry, MD  chlorthalidone (HYGROTON) 25 MG tablet Take 1 tablet (25 mg total) by mouth daily. Patient not taking: Reported on 03/07/2014 37/62/83   Delora Fuel, MD  lactulose [facility]) 10 GM/15ML solution Take 15 mLs (10 g total) by mouth 2 (two) times daily as needed for mild constipation or moderate constipation. Patient not taking: Reported on 03/07/2014 12/06/13  Daleen Bo, MD   BP 127/77 mmHg  Pulse 97  Temp(Src) 98.8 F (37.1 C) (Oral)  Resp 13  Ht 5\' 5"  (1.651 m)  Wt 218 lb (98.884 kg)  BMI 36.28 kg/m2  SpO2 99%  LMP 05/07/2014 Physical Exam  Constitutional: She is oriented to person, place, and time. She appears well-developed and well-nourished. No distress.  Pt appears anxious  HENT:  Head: Normocephalic and atraumatic.  Mouth/Throat: Oropharynx is clear and moist. No oropharyngeal exudate.  Eyes: Conjunctivae and EOM are normal. Pupils are equal, round, and reactive to light.  Neck: Normal range of motion. Neck supple.  No meningismus.  Cardiovascular: Regular rhythm, normal heart sounds and intact distal pulses.  Tachycardia present.   No murmur heard. Pulmonary/Chest: Effort normal and breath sounds normal. No respiratory distress. She exhibits no tenderness.  Abdominal: Soft. There is no tenderness. There is no rebound and no guarding.  Musculoskeletal: Normal range of motion. She exhibits no edema or tenderness.  Neurological: She is alert and oriented to person, place, and time. No cranial nerve deficit. She exhibits normal muscle tone. Coordination normal.  No ataxia on finger to nose bilaterally. No pronator drift. 5/5 strength throughout. CN 2-12 intact. Negative Romberg. Equal  grip strength. Sensation intact. Gait is normal.   Skin: Skin is warm.  Psychiatric: She has a normal mood and affect. Her behavior is normal.  Nursing note and vitals reviewed.   ED Course  Procedures (including critical care time) DIAGNOSTIC STUDIES: Oxygen Saturation is 100% on RA, normal by my interpretation.    COORDINATION OF CARE: 9:35 AM- Pt's parents advised of plan for treatment. Parents verbalize understanding and agreement with plan.  11:59 AM-  Pt states she is doing better and is ready to go home.  Labs Review Labs Reviewed  VALPROIC ACID LEVEL - Abnormal; Notable for the following:    Valproic Acid Lvl <10 (*)    All other components within normal limits  CBC WITH DIFFERENTIAL/PLATELET  BASIC METABOLIC PANEL  URINALYSIS, ROUTINE W REFLEX MICROSCOPIC  PREGNANCY, URINE  TROPONIN I    Imaging Review No results found.   EKG Interpretation   Date/Time:  Monday May 23 2014 10:51:00 EDT Ventricular Rate:  99 PR Interval:  158 QRS Duration: 90 QT Interval:  354 QTC Calculation: 454 R Axis:   48 Text Interpretation:  Sinus rhythm Baseline wander in lead(s) II III aVR  aVL aVF No significant change was found Confirmed by Wyvonnia Dusky  MD, Mickey Esguerra  ([id number]) on 05/23/2014 11:12:25 AM      MDM   Final diagnoses:  Nonintractable episodic headache, unspecified headache type  Anxiety   Patient well known to the ED. C/o anxiety and stress at home which triggered a typical migraine.  Gradual onset headache.  No fever.  No focal weakness, numbness, tingling. Associated with palpitations.  No CP or SOB.  EKG nsr.  nonfocal neuro exam. No meningismus.  Headache typical of previous migraines.  Given IVF, reglan, benadryl, zofran, ativan.  Patient understands that she will not be receiving narcotics today No indication for neuro imaging.  Patient feeling improved after above treatment and requesting discharge. Tolerating PO and ambulatory. Depakote level low.  She does not  want to stay for this infusion.  Denies SI or HI.  Followup with PCP> Return precautions discussed.   I personally performed the services described in this documentation, which was scribed in my presence. The recorded information has been reviewed and is accurate.   Emily Essex,  MD 05/23/14 [id number]

## 2014-05-23 NOTE — ED Notes (Signed)
Took oxycodone 15 mg at 0230 this am.

## 2014-05-23 NOTE — ED Notes (Signed)
Pt states that she has a migraine and that her anxiety is not being controlled by her Klonopin.

## 2014-05-23 NOTE — Discharge Instructions (Signed)

## 2014-05-24 ENCOUNTER — Encounter (HOSPITAL_COMMUNITY): Payer: Self-pay

## 2014-05-24 ENCOUNTER — Emergency Department (HOSPITAL_COMMUNITY)
Admission: EM | Admit: 2014-05-24 | Discharge: 2014-05-24 | Disposition: A | Discharge status: Home / Self Care | Payer: Medicare Other | Attending: Emergency Medicine | Admitting: Emergency Medicine

## 2014-05-24 DIAGNOSIS — G35 Multiple sclerosis: Secondary | ICD-10-CM | POA: Insufficient documentation

## 2014-05-24 DIAGNOSIS — Z8742 Personal history of other diseases of the female genital tract: Secondary | ICD-10-CM | POA: Insufficient documentation

## 2014-05-24 DIAGNOSIS — F329 Major depressive disorder, single episode, unspecified: Secondary | ICD-10-CM | POA: Diagnosis not present

## 2014-05-24 DIAGNOSIS — R51 Headache: Secondary | ICD-10-CM

## 2014-05-24 DIAGNOSIS — J45909 Unspecified asthma, uncomplicated: Secondary | ICD-10-CM | POA: Insufficient documentation

## 2014-05-24 DIAGNOSIS — G40909 Epilepsy, unspecified, not intractable, without status epilepticus: Secondary | ICD-10-CM | POA: Diagnosis not present

## 2014-05-24 DIAGNOSIS — F410 Panic disorder [episodic paroxysmal anxiety] without agoraphobia: Secondary | ICD-10-CM | POA: Diagnosis not present

## 2014-05-24 DIAGNOSIS — Z79899 Other long term (current) drug therapy: Secondary | ICD-10-CM | POA: Insufficient documentation

## 2014-05-24 DIAGNOSIS — Z791 Long term (current) use of non-steroidal anti-inflammatories (NSAID): Secondary | ICD-10-CM | POA: Insufficient documentation

## 2014-05-24 DIAGNOSIS — Z720 Tobacco use: Secondary | ICD-10-CM | POA: Diagnosis not present

## 2014-05-24 DIAGNOSIS — R519 Headache, unspecified: Secondary | ICD-10-CM

## 2014-05-24 DIAGNOSIS — G43909 Migraine, unspecified, not intractable, without status migrainosus: Secondary | ICD-10-CM | POA: Insufficient documentation

## 2014-05-24 DIAGNOSIS — K219 Gastro-esophageal reflux disease without esophagitis: Secondary | ICD-10-CM | POA: Diagnosis not present

## 2014-05-24 DIAGNOSIS — G8929 Other chronic pain: Secondary | ICD-10-CM | POA: Insufficient documentation

## 2014-05-24 MED ORDER — DIVALPROEX SODIUM 250 MG PO DR TAB
500.0000 mg | DELAYED_RELEASE_TABLET | Freq: Once | ORAL | Status: AC
  Administered 2014-05-24: 500 mg via ORAL
  Filled 2014-05-24: qty 2

## 2014-05-24 MED ORDER — DIPHENHYDRAMINE HCL 50 MG/ML IJ SOLN
25.0000 mg | Freq: Once | INTRAMUSCULAR | Status: AC
  Administered 2014-05-24: 25 mg via INTRAVENOUS
  Filled 2014-05-24: qty 1

## 2014-05-24 MED ORDER — SODIUM CHLORIDE 0.9 % IV BOLUS (SEPSIS)
1000.0000 mL | Freq: Once | INTRAVENOUS | Status: AC
  Administered 2014-05-24: 1000 mL via INTRAVENOUS

## 2014-05-24 MED ORDER — METOCLOPRAMIDE HCL 5 MG/ML IJ SOLN
10.0000 mg | Freq: Once | INTRAMUSCULAR | Status: AC
  Administered 2014-05-24: 10 mg via INTRAVENOUS
  Filled 2014-05-24: qty 2

## 2014-05-24 NOTE — ED Notes (Signed)
Pt monitored by pulse ox and bp cuff per nurse. Sz pads placed on rails.

## 2014-05-24 NOTE — ED Notes (Signed)
Pt reports she had a seizure at 0100 this morning and a second seizure at 0700.  Pt reports increase in anxiety and her home medications did not help.  Pt reports she was supposed to go to court with her fiance this morning but her anxiety was too bad to go.

## 2014-05-24 NOTE — ED Provider Notes (Signed)
Pt has history of recurrent visits for anxiety, headaches.   Physical Exam  BP 146/106 mmHg  Pulse 91  Temp(Src) 98.1 F (36.7 C) (Oral)  Resp 18  Ht 5\' 5"  (1.651 m)  Wt 218 lb (98.884 kg)  BMI 36.28 kg/m2  SpO2 99%  LMP 05/07/2014  Physical Exam  Constitutional: She appears well-developed and well-nourished. No distress.  HENT:  Head: Normocephalic and atraumatic.  Right Ear: External ear normal.  Left Ear: External ear normal.  Eyes: Conjunctivae are normal. Right eye exhibits no discharge. Left eye exhibits no discharge. No scleral icterus.  Neck: Neck supple. No tracheal deviation present.  Cardiovascular: Normal rate.   Pulmonary/Chest: Effort normal. No stridor. No respiratory distress.  Musculoskeletal: She exhibits no edema.  Neurological: She is alert. Cranial nerve deficit: no gross deficits.  Skin: Skin is warm and dry. No rash noted.  Psychiatric: She has a normal mood and affect.  Nursing note and vitals reviewed.   ED Course  Procedures  MDM Care plan reviewed.  No seizure or acute emergency medical condition today.  there does not appear to be any evidence of an acute emergency medical condition and the patient appears stable for discharge with appropriate outpatient follow up.      Dorie Rank, MD 05/27/14 1538

## 2014-05-24 NOTE — ED Provider Notes (Signed)
CSN: [id number]     Arrival date & time 05/24/14  8101 History   First MD Initiated Contact with Patient 05/24/14 [phone]     Chief Complaint  Patient presents with  . Anxiety     (Consider location/radiation/quality/duration/timing/severity/associated sxs/prior Treatment) Patient is a 34 y.o. female presenting with headaches. The history is provided by the patient.  Headache Pain location:  L parietal Quality:  Stabbing Radiates to:  Does not radiate Severity at highest:  10/10 Onset quality:  Gradual Duration:  2 days Timing:  Intermittent Progression:  Waxing and waning Chronicity:  New Similar to prior headaches: yes   Context: emotional stress   Relieved by:  Nothing Worsened by:  Nothing Ineffective treatments:  Prescription medications Associated symptoms: nausea and seizures   Associated symptoms: no abdominal pain, no back pain, no blurred vision, no cough, no diarrhea, no dizziness, no eye pain, no facial pain, no fatigue, no fever, no focal weakness, no loss of balance, no near-syncope, no neck pain, no neck stiffness, no numbness, no photophobia, no syncope, no visual change, no vomiting and no weakness   Seizures:    Seizure activity on arrival: no     Seizure type:  Unable to specify (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 Headache(784.0)     migraines  . Multiple sclerosis   . Asthma as a child  . GERD (gastroesophageal reflux disease)   . Urinary urgency   . Anxiety   . Perforated bowel 2009  . Migraines   . Depression   . Chronic back pain   . Chronic pain   . Ovarian cyst   . Panic attack    Past Surgical History  Procedure Laterality Date  . Extremity cyst excision  1994    right leg  . 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 Colostomy closure  04/2007  . Scar revision  01/21/2011   Procedure: SCAR REVISION;  Surgeon: Hermelinda Dellen;  Location: [place];  Service: Plastics;  Laterality: N/A;  exploration of scar of abdomen and repair of defect  . Hernia repair    . Abdominal surgery    . Appendectomy     Family History  Problem Relation Age of Onset  . Diabetes Mother   . Hypertension Mother   . Diabetes Father   . Hypertension Father    History  Substance Use Topics  . Smoking status: Current Some Day Smoker -- 1.00 packs/day for 10 years    Types: Cigarettes  . Smokeless tobacco: Never Used  . Alcohol Use: No     Comment: Rare   OB History    No data available     Review of Systems  Constitutional: Negative for fever, chills, diaphoresis, appetite change and fatigue.  Eyes: Negative for blurred vision, photophobia, pain, redness and visual disturbance.  Respiratory: Negative for cough, chest tightness and shortness of breath.   Cardiovascular: Negative for chest pain, palpitations, leg swelling, syncope and near-syncope.  Gastrointestinal: Positive for nausea. Negative for vomiting, abdominal pain and diarrhea.  Genitourinary: Negative for dysuria and difficulty urinating.  Musculoskeletal: Negative for back pain, neck pain and neck stiffness.  Skin: Negative for color change, pallor and rash.  Neurological: Positive for seizures and headaches. Negative for dizziness, tremors, focal weakness, syncope,  facial asymmetry, speech difficulty, weakness, light-headedness, numbness and loss of balance.  All other systems reviewed and are negative.     Allergies  Amitriptyline; Baclofen; Cymbalta; Gabapentin; Monosodium glutamate; Vicodin; Alprazolam; Magnesium salicylate; Rizatriptan; Tizanidine; Toradol; Tramadol; Adhesive; and Lamotrigine  Home Medications   Prior to Admission medications   Medication Sig Start Date End Date Taking? Authorizing Provider  celecoxib (CELEBREX) 100 MG capsule Take 1 capsule (100 mg total) by mouth 2  (two) times daily. 11/01/13  Yes Lily Kocher, PA-C  cyclobenzaprine (FLEXERIL) 10 MG tablet Take 10 mg by mouth 3 (three) times daily as needed for muscle spasms (muscle spasms).    Yes Historical Provider, MD  diphenhydrAMINE (BENADRYL) 25 MG tablet Take 50 mg by mouth every 8 (eight) hours as needed for sleep.    Yes Historical Provider, MD  divalproex (DEPAKOTE) 500 MG DR tablet Take 1 tablet (500 mg total) by mouth 2 (two) times daily. 04/14/14  Yes April Palumbo, MD  Fingolimod HCl (GILENYA) 0.5 MG CAPS Take 0.5 mg by mouth daily.    Yes Historical Provider, MD  hydrOXYzine (ATARAX/VISTARIL) 25 MG tablet Take 1 tablet (25 mg total) by mouth every 6 (six) hours. 02/05/14  Yes Nicole Pisciotta, PA-C  loratadine (CLARITIN) 10 MG tablet Take 10 mg by mouth daily.    Yes Historical Provider, MD  omeprazole (PRILOSEC) 40 MG capsule Take 40 mg by mouth daily.    Yes Historical Provider, MD  ondansetron (ZOFRAN) 8 MG tablet Take 8 mg by mouth every 6 (six) hours as needed for nausea or vomiting (nausea).    Yes Historical Provider, MD  oxyCODONE (ROXICODONE) 15 MG immediate release tablet Take 15 mg by mouth every 8 (eight) hours as needed for pain (pain).    Yes Historical Provider, MD  potassium chloride (K-DUR) 10 MEQ tablet Take 10 mEq by mouth 2 (two) times daily.   Yes Historical Provider, MD  pregabalin (LYRICA) 200 MG capsule Take 600 mg by mouth at bedtime.   Yes Historical Provider, MD  propranolol (INDERAL) 60 MG tablet Take 1 tablet (60 mg total) by mouth 2 (two) times daily. 11/23/13  Yes Penni Bombard, MD  ranitidine (ZANTAC) 300 MG capsule Take 300 mg by mouth at bedtime.    Yes Historical Provider, MD  topiramate (TOPAMAX) 100 MG tablet Take 100 mg by mouth 2 (two) times daily.    Yes Historical Provider, MD  venlafaxine (EFFEXOR) 75 MG tablet Take 75 mg by mouth 3 (three) times daily with meals.   Yes Historical Provider, MD  Alum & Mag Hydroxide-Simeth (MAGIC MOUTHWASH W/LIDOCAINE)  SOLN Take 5 mLs by mouth 3 (three) times daily as needed (For reflux pain). Patient not taking: Reported on 03/07/2014 10/24/13   Tanna Furry, MD  chlorthalidone (HYGROTON) 25 MG tablet Take 1 tablet (25 mg total) by mouth daily. Patient not taking: Reported on 03/07/2014 67/12/45   Delora Fuel, MD  EPINEPHrine (EPIPEN) 0.3 mg/0.3 mL SOAJ injection Inject 0.3 mg into the muscle as needed (allergic reaction).     Historical Provider, MD  lactulose (CHRONULAC) 10 GM/15ML solution Take 15 mLs (10 g total) by mouth 2 (two) times daily as needed for mild constipation or moderate constipation. Patient not taking: Reported on 03/07/2014 12/06/13   Daleen Bo, MD  promethazine (PHENERGAN) 25 MG tablet Take 25 mg by mouth every 6 (six) hours as needed for nausea or vomiting.     Historical Provider, MD   BP 149/95 mmHg  Pulse 93  Temp(Src) 98.1 F (36.7 C) (Oral)  Resp 18  Ht 5\' 5"  (1.651 m)  Wt 218 lb (98.884 kg)  BMI 36.28 kg/m2  SpO2 100%  LMP 05/07/2014 Physical Exam  Constitutional: She is oriented to person, place, and time. She appears well-developed and well-nourished. No distress.  HENT:  Head: Normocephalic and atraumatic.  Mouth/Throat: Oropharynx is clear and moist.  Eyes: EOM are normal. Pupils are equal, round, and reactive to light.  Neck: Normal range of motion. Neck supple.  Cardiovascular: Normal rate, regular rhythm, normal heart sounds and intact distal pulses.  Exam reveals no gallop and no friction rub.   No murmur heard. Pulmonary/Chest: Effort normal and breath sounds normal. No respiratory distress. She has no wheezes. She has no rales.  Abdominal: Soft. Bowel sounds are normal. She exhibits no distension. There is no tenderness. There is no rebound and no guarding.  Musculoskeletal: Normal range of motion. She exhibits no edema or tenderness.  Neurological: She is alert and oriented to person, place, and time. She has normal strength. No cranial nerve deficit or sensory  deficit. She exhibits normal muscle tone. Coordination normal. GCS eye subscore is 4. GCS verbal subscore is 5. GCS motor subscore is 6.  Skin: Skin is warm and dry. No rash noted. She is not diaphoretic. No erythema. No pallor.  Nursing note and vitals reviewed.   ED Course  Procedures (including critical care time) Labs Review Labs Reviewed - No data to display  Imaging Review No results found.   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  Pt states she was supposed to go to court this morning which was causing significant stress- when she was going to leave to go to court she had 2 reported seizures witnessed by her boyfriend.  Unable to describe these, boyfriend not at bedside.  Following this reports developing HA which is her typical migraine- L-sided, non-radiating, gradual onset.  Reports not currently having symptoms of her MS flares.  Denies vision changes, weakness, numbness, tingling.  +nausea, no vomiting.  No fevers, neck stiffness.  Of note pt was in ED at [facility] last night for same symptoms- treated with migraine cocktail and symptoms improved.  Her Depakote level at that time was undetectable but pt declined loading dose.   She reports compliance with Depakote, however on chart review her levels are frequently undetectable.  Takes Klonopin for anxiety, reports taking this without relief this morning.  On presentation, pt alert, AFVSS, in NAD.  Neuro exam with no focal deficits, no ataxia.  Neck supple, no meningismus.  No other acute findings.  Appears to be recurrence of pt's chronic medical problems.  No focal neuro deficits to suggest MS flare, CVA.  No fever, AMS, or neck stiffness to suggest meningitis/encephalitis.  History atypical for [facility].  Plan for migraine  cocktail.  Will give pt's home dose of Depakote.  Pt reports symptomatic improvement following above treatment.  Stable for discharge.  Has appt with neuropsychiatrist scheduled for next week, advised to keep this appt.  ED return precautions given.  No other concerns.  Discussed with attending Dr. Tomi Bamberger.    Ellwood Dense, MD 05/24/14 [phone]

## 2014-05-30 ENCOUNTER — Encounter (HOSPITAL_COMMUNITY): Payer: Self-pay | Admitting: Emergency Medicine

## 2014-05-30 ENCOUNTER — Emergency Department (HOSPITAL_COMMUNITY)
Admission: EM | Admit: 2014-05-30 | Discharge: 2014-05-30 | Disposition: A | Discharge status: Home / Self Care | Payer: Medicare Other | Attending: Emergency Medicine | Admitting: Emergency Medicine

## 2014-05-30 DIAGNOSIS — F410 Panic disorder [episodic paroxysmal anxiety] without agoraphobia: Secondary | ICD-10-CM | POA: Diagnosis not present

## 2014-05-30 DIAGNOSIS — Z720 Tobacco use: Secondary | ICD-10-CM | POA: Insufficient documentation

## 2014-05-30 DIAGNOSIS — F445 Conversion disorder with seizures or convulsions: Secondary | ICD-10-CM

## 2014-05-30 DIAGNOSIS — K219 Gastro-esophageal reflux disease without esophagitis: Secondary | ICD-10-CM | POA: Insufficient documentation

## 2014-05-30 DIAGNOSIS — Z8742 Personal history of other diseases of the female genital tract: Secondary | ICD-10-CM | POA: Insufficient documentation

## 2014-05-30 DIAGNOSIS — Y998 Other external cause status: Secondary | ICD-10-CM | POA: Diagnosis not present

## 2014-05-30 DIAGNOSIS — J45909 Unspecified asthma, uncomplicated: Secondary | ICD-10-CM | POA: Insufficient documentation

## 2014-05-30 DIAGNOSIS — Y9289 Other specified places as the place of occurrence of the external cause: Secondary | ICD-10-CM | POA: Insufficient documentation

## 2014-05-30 DIAGNOSIS — S0990XA Unspecified injury of head, initial encounter: Secondary | ICD-10-CM | POA: Diagnosis not present

## 2014-05-30 DIAGNOSIS — W01198A Fall on same level from slipping, tripping and stumbling with subsequent striking against other object, initial encounter: Secondary | ICD-10-CM | POA: Diagnosis not present

## 2014-05-30 DIAGNOSIS — F329 Major depressive disorder, single episode, unspecified: Secondary | ICD-10-CM | POA: Insufficient documentation

## 2014-05-30 DIAGNOSIS — R000 Tachycardia, unspecified: Secondary | ICD-10-CM | POA: Diagnosis not present

## 2014-05-30 DIAGNOSIS — Z7951 Long term (current) use of inhaled steroids: Secondary | ICD-10-CM | POA: Diagnosis not present

## 2014-05-30 DIAGNOSIS — Y9389 Activity, other specified: Secondary | ICD-10-CM | POA: Insufficient documentation

## 2014-05-30 DIAGNOSIS — Z791 Long term (current) use of non-steroidal anti-inflammatories (NSAID): Secondary | ICD-10-CM | POA: Diagnosis not present

## 2014-05-30 DIAGNOSIS — R569 Unspecified convulsions: Secondary | ICD-10-CM | POA: Insufficient documentation

## 2014-05-30 DIAGNOSIS — G8929 Other chronic pain: Secondary | ICD-10-CM | POA: Insufficient documentation

## 2014-05-30 DIAGNOSIS — Z79899 Other long term (current) drug therapy: Secondary | ICD-10-CM | POA: Insufficient documentation

## 2014-05-30 HISTORY — DX: Unspecified convulsions: R56.9

## 2014-05-30 HISTORY — DX: Conversion disorder with seizures or convulsions: F44.5

## 2014-05-30 MED ORDER — LORAZEPAM 1 MG PO TABS
2.0000 mg | ORAL_TABLET | Freq: Once | ORAL | Status: AC
  Administered 2014-05-30: 2 mg via ORAL
  Filled 2014-05-30: qty 2

## 2014-05-30 NOTE — Discharge Instructions (Signed)
Please call your doctor for a followup appointment within 24-48 hours. When you talk to your doctor please let them know that you were seen in the emergency department and have them acquire all of your records so that they can discuss the findings with you and formulate a treatment plan to fully care for your new and ongoing problems. ° °

## 2014-05-30 NOTE — ED Notes (Addendum)
Patient states she had a seizure at home and fell hitting the back of her head on the stove. States "I felt like it was going to happen again and I don't want it to happen again." Patient states "I just got off the phone with my mom and she was telling me she was going to evict me, and every time I get upset I have pseudoseizures." Patient has no notable injury at this time.

## 2014-05-30 NOTE — ED Notes (Signed)
MD at bedside. 

## 2014-05-30 NOTE — ED Notes (Signed)
Pt alert & oriented x4, stable gait. Patient given discharge instructions, paperwork & prescription(s). Patient  instructed to stop at the registration desk to finish any additional paperwork. Patient verbalized understanding. Pt left department w/ no further questions. 

## 2014-05-30 NOTE — ED Notes (Signed)
Seizure pads in place on stretcher.

## 2014-05-30 NOTE — ED Provider Notes (Signed)
CSN: [id number]     Arrival date & time 05/30/14  1811 History   First MD Initiated Contact with Patient 05/30/14 1833     Chief Complaint  Patient presents with  . Seizures  . Head Injury     (Consider location/radiation/quality/duration/timing/severity/associated sxs/prior Treatment) HPI Comments: The patient is a 34 year old female with frequent visits to the emergency department for her history of pseudoseizures, chronic pain and multiple sclerosis. She reports that today she was evicted from her house by her mother, this made her extremely anxious, she had a pseudoseizure and states that she fell to the ground striking the back of her head as she fell. She was able to wake up immediately, her significant other was in the room, she states now she feels like she is going to have another pseudoseizure. The patient has initiated contact with a neuropsychologist who she will be seeing within the next 2 days, she is taking all of her medications as prescribed including Depakote, she has almost daily pseudoseizures and relates each of these instances to significant anxiety and stressful events. At this time she has no physical complaints other than feeling short of breath and anxious and jittery.  Patient is a 34 y.o. female presenting with seizures and head injury. The history is provided by the patient.  Seizures Head Injury Associated symptoms: seizures     Past Medical History  Diagnosis Date  . Headache(784.0)     migraines  . Multiple sclerosis   . Asthma as a child  . GERD (gastroesophageal reflux disease)   . Urinary urgency   . Anxiety   . Perforated bowel 2009  . Migraines   . Depression   . Chronic back pain   . Chronic pain   . Ovarian cyst   . Panic attack   . Pseudoseizures    Past Surgical History  Procedure Laterality Date  . Extremity cyst excision  1994    right leg  . 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 Colostomy closure  04/2007  . Scar revision   01/21/2011    Procedure: SCAR REVISION;  Surgeon: Hermelinda Dellen;  Location: [facility];  Service: Plastics;  Laterality: N/A;  exploration of scar of abdomen and repair of defect  . Hernia repair    . Abdominal surgery    . Appendectomy     Family History  Problem Relation Age of Onset  . Diabetes Mother   . Hypertension Mother   . Diabetes Father   . Hypertension Father    History  Substance Use Topics  . Smoking status: Current Some Day Smoker -- 1.00 packs/day for 10 years    Types: Cigarettes  . Smokeless tobacco: Never Used  . Alcohol Use: No     Comment: Rare   OB History    No data available     Review of Systems  Neurological: Positive for seizures.  All other systems reviewed and are negative.     Allergies  Amitriptyline; Baclofen; Cymbalta; Gabapentin; Monosodium glutamate; Vicodin; Alprazolam; Magnesium salicylate; Rizatriptan; Tizanidine; Toradol; Tramadol; Adhesive; and Lamotrigine  Home Medications   Prior to Admission medications   Medication Sig Start Date End Date Taking? Authorizing Provider  amoxicillin (AMOXIL) 500 MG capsule Take 1,000 mg by mouth every 12 (twelve) hours. 10 day course starting on 05/26/2014   Yes Historical Provider, MD  celecoxib (CELEBREX) 100 MG capsule Take 1 capsule (100 mg total) by mouth 2 (two) times daily.  11/01/13  Yes Lily Kocher, PA-C  clonazePAM (KLONOPIN) 1 MG tablet Take 1 tablet by mouth 3 (three) times daily. 05/27/14  Yes Historical Provider, MD  cyclobenzaprine (FLEXERIL) 10 MG tablet Take 10 mg by mouth 3 (three) times daily as needed for muscle spasms (muscle spasms).    Yes Historical Provider, MD  diphenhydrAMINE (BENADRYL) 25 MG tablet Take 50 mg by mouth every 8 (eight) hours as needed for sleep.    Yes Historical Provider, MD  divalproex (DEPAKOTE) 500 MG DR tablet Take 1 tablet (500 mg total) by mouth 2 (two) times daily. 04/14/14  Yes April Palumbo, MD  Fingolimod HCl (GILENYA) 0.5 MG  CAPS Take 0.5 mg by mouth daily.    Yes Historical Provider, MD  fluticasone (FLONASE) 50 MCG/ACT nasal spray Place 2 sprays into both nostrils daily.   Yes Historical Provider, MD  guaiFENesin-codeine (ROBITUSSIN AC) 100-10 MG/5ML syrup Take 5 mLs by mouth 3 (three) times daily as needed for cough.   Yes Historical Provider, MD  hydrOXYzine (ATARAX/VISTARIL) 25 MG tablet Take 1 tablet (25 mg total) by mouth every 6 (six) hours. 02/05/14  Yes Nicole Pisciotta, PA-C  loratadine (CLARITIN) 10 MG tablet Take 10 mg by mouth daily.    Yes Historical Provider, MD  omeprazole (PRILOSEC) 40 MG capsule Take 40 mg by mouth daily.    Yes Historical Provider, MD  ondansetron (ZOFRAN) 8 MG tablet Take 8 mg by mouth every 6 (six) hours as needed for nausea or vomiting (nausea).    Yes Historical Provider, MD  oxyCODONE (ROXICODONE) 15 MG immediate release tablet Take 15 mg by mouth every 8 (eight) hours as needed for pain (pain).    Yes Historical Provider, MD  potassium chloride (K-DUR) 10 MEQ tablet Take 10 mEq by mouth 2 (two) times daily.   Yes Historical Provider, MD  pregabalin (LYRICA) 200 MG capsule Take 600 mg by mouth at bedtime.   Yes Historical Provider, MD  promethazine (PHENERGAN) 25 MG tablet Take 25 mg by mouth every 6 (six) hours as needed for nausea or vomiting.    Yes Historical Provider, MD  propranolol (INDERAL) 60 MG tablet Take 1 tablet (60 mg total) by mouth 2 (two) times daily. 11/23/13  Yes Penni Bombard, MD  QUEtiapine (SEROQUEL) 100 MG tablet Take 100 mg by mouth at bedtime.   Yes Historical Provider, MD  ranitidine (ZANTAC) 300 MG capsule Take 300 mg by mouth at bedtime.    Yes Historical Provider, MD  topiramate (TOPAMAX) 100 MG tablet Take 100 mg by mouth 2 (two) times daily.    Yes Historical Provider, MD  venlafaxine (EFFEXOR) 75 MG tablet Take 75 mg by mouth 3 (three) times daily with meals.   Yes Historical Provider, MD  Alum & Mag Hydroxide-Simeth (MAGIC MOUTHWASH  W/LIDOCAINE) SOLN Take 5 mLs by mouth 3 (three) times daily as needed (For reflux pain). Patient not taking: Reported on 03/07/2014 10/24/13   Tanna Furry, MD  chlorthalidone (HYGROTON) 25 MG tablet Take 1 tablet (25 mg total) by mouth daily. Patient not taking: Reported on 03/07/2014 93/81/82   Delora Fuel, MD  EPINEPHrine (EPIPEN) 0.3 mg/0.3 mL SOAJ injection Inject 0.3 mg into the muscle as needed (allergic reaction).     Historical Provider, MD  lactulose (CHRONULAC) 10 GM/15ML solution Take 15 mLs (10 g total) by mouth 2 (two) times daily as needed for mild constipation or moderate constipation. Patient not taking: Reported on 03/07/2014 12/06/13   Daleen Bo, MD  BP 140/105 mmHg  Pulse 121  Temp(Src) 98.8 F (37.1 C) (Oral)  Resp 18  Ht 5\' 5"  (1.651 m)  Wt 214 lb (97.07 kg)  BMI 35.61 kg/m2  SpO2 99%  LMP 05/07/2014 Physical Exam  Constitutional: She appears well-developed and well-nourished.  HENT:  Head: Normocephalic and atraumatic.  Mouth/Throat: Oropharynx is clear and moist. No oropharyngeal exudate.  Eyes: Conjunctivae and EOM are normal. Pupils are equal, round, and reactive to light. Right eye exhibits no discharge. Left eye exhibits no discharge. No scleral icterus.  Neck: Normal range of motion. Neck supple. No JVD present. No thyromegaly present.  Cardiovascular: Regular rhythm, normal heart sounds and intact distal pulses.  Exam reveals no gallop and no friction rub.   No murmur heard. Tachycardic to 110 on my exam  Pulmonary/Chest: Effort normal and breath sounds normal. No respiratory distress. She has no wheezes. She has no rales.  Abdominal: Soft. Bowel sounds are normal. She exhibits no distension and no mass. There is no tenderness.  Musculoskeletal: Normal range of motion. She exhibits no edema or tenderness.  Lymphadenopathy:    She has no cervical adenopathy.  Neurological: She is alert. Coordination normal.  Speech is clear, moves all extremities 4  without difficulty, cranial nerves III through XII intact  Skin: Skin is warm and dry. No rash noted. No erythema.  Psychiatric: She has a normal mood and affect. Her behavior is normal.  Anxious appearing  Nursing note and vitals reviewed.   ED Course  Procedures (including critical care time) Labs Review Labs Reviewed - No data to display  Imaging Review No results found.    MDM   Final diagnoses:  Pseudoseizure    The patient has vital signs concerning only for her tachycardia which at this time is appropriate given her severe anxiety, she is not having any seizure activity, she has a documented history of nonepileptic seizures, the patient is currently under therapy for this and will be getting further specialist evaluation for the same. At this time appropriate intervention would be benzodiazepine, she can be safely discharged in the care of her significant other with whom she will be staying this evening. The patient is in agreement with this plan.  No further seizure like activity - tachcyardia improved, stable for d/c.  Meds given in ED:  Medications  LORazepam (ATIVAN) tablet 2 mg (not administered)    New Prescriptions   No medications on file     Noemi Chapel, MD 05/30/14 1943

## 2014-06-08 ENCOUNTER — Encounter (HOSPITAL_COMMUNITY): Payer: Self-pay | Admitting: Emergency Medicine

## 2014-06-08 ENCOUNTER — Emergency Department (HOSPITAL_COMMUNITY)
Admission: EM | Admit: 2014-06-08 | Discharge: 2014-06-08 | Disposition: A | Discharge status: Home / Self Care | Payer: Medicare Other | Attending: Emergency Medicine | Admitting: Emergency Medicine

## 2014-06-08 DIAGNOSIS — K219 Gastro-esophageal reflux disease without esophagitis: Secondary | ICD-10-CM | POA: Diagnosis not present

## 2014-06-08 DIAGNOSIS — Z791 Long term (current) use of non-steroidal anti-inflammatories (NSAID): Secondary | ICD-10-CM | POA: Diagnosis not present

## 2014-06-08 DIAGNOSIS — Z8742 Personal history of other diseases of the female genital tract: Secondary | ICD-10-CM | POA: Diagnosis not present

## 2014-06-08 DIAGNOSIS — Z720 Tobacco use: Secondary | ICD-10-CM | POA: Insufficient documentation

## 2014-06-08 DIAGNOSIS — J45909 Unspecified asthma, uncomplicated: Secondary | ICD-10-CM | POA: Insufficient documentation

## 2014-06-08 DIAGNOSIS — G8929 Other chronic pain: Secondary | ICD-10-CM | POA: Diagnosis not present

## 2014-06-08 DIAGNOSIS — F419 Anxiety disorder, unspecified: Secondary | ICD-10-CM | POA: Insufficient documentation

## 2014-06-08 DIAGNOSIS — M546 Pain in thoracic spine: Secondary | ICD-10-CM

## 2014-06-08 DIAGNOSIS — Z8679 Personal history of other diseases of the circulatory system: Secondary | ICD-10-CM | POA: Insufficient documentation

## 2014-06-08 DIAGNOSIS — F329 Major depressive disorder, single episode, unspecified: Secondary | ICD-10-CM | POA: Diagnosis not present

## 2014-06-08 DIAGNOSIS — Z79899 Other long term (current) drug therapy: Secondary | ICD-10-CM | POA: Diagnosis not present

## 2014-06-08 MED ORDER — METHOCARBAMOL 500 MG PO TABS: 500.0000 mg | ORAL_TABLET | Freq: Two times a day (BID) | ORAL | Status: DC

## 2014-06-08 MED ORDER — METHOCARBAMOL 500 MG PO TABS
500.0000 mg | ORAL_TABLET | Freq: Once | ORAL | Status: AC
  Administered 2014-06-08: 500 mg via ORAL
  Filled 2014-06-08: qty 1

## 2014-06-08 NOTE — ED Notes (Signed)
Per EMS-chronic back pain, patient appear sedated, falling asleep on stretcher, no s/s's of distress

## 2014-06-08 NOTE — ED Notes (Addendum)
Pt complaint of acute chronic mid back pain onset 1500 today; pt denies injury.

## 2014-06-08 NOTE — ED Provider Notes (Signed)
CSN: [id number]     Arrival date & time 06/08/14  1610 History  This chart was scribed for non-physician practitioner [name], PA-C working with Everlene Balls, MD by Hilda Lias, ED Scribe. This patient was seen in room WTR6/WTR6 and the patient's care was started at 5:37 PM.    Chief Complaint  Patient presents with  . Back Pain      The history is provided by the patient. No language interpreter was used.     HPI Comments: Emily [name] is a 34 y.o. female with multiple sclerosis,pseudoseizures, chronic pain who presents to the Emergency Department complaining of acute chronic constant bilateral mid back pain that has been present for 3 hours. Pt denies any known injury to the area. Pt reports taking oxycodone 15 mg, which she also takes for MS, and ibuprofen additionally for her pain daily but it is not helping right now. She reports taking a Flexeril this morning for some other pain she was having. The patient is crying because she came to the ER by EMS and her fiance is not here. She is concerned that he is in the waiting room at another hospital and doesn't know where to find her because he isn't answering his phone. Pt is very anxious, she reports she was at [employer] waiting when all of these symptoms started.    Past Medical History  Diagnosis Date  . Headache(784.0)     migraines  . Multiple sclerosis   . Asthma as a child  . GERD (gastroesophageal reflux disease)   . Urinary urgency   . Anxiety   . Perforated bowel 2009  . Migraines   . Depression   . Chronic back pain   . Chronic pain   . Ovarian cyst   . Panic attack   . Pseudoseizures    Past Surgical History  Procedure Laterality Date  . Extremity cyst excision  1994    right leg  . 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 Colostomy closure  04/2007  . Scar revision  01/21/2011    Procedure: SCAR REVISION;  Surgeon: Hermelinda Dellen;  Location: [facility];  Service: Plastics;   Laterality: N/A;  exploration of scar of abdomen and repair of defect  . Hernia repair    . Abdominal surgery    . Appendectomy     Family History  Problem Relation Age of Onset  . Diabetes Mother   . Hypertension Mother   . Diabetes Father   . Hypertension Father    History  Substance Use Topics  . Smoking status: Current Some Day Smoker -- 1.00 packs/day for 10 years    Types: Cigarettes  . Smokeless tobacco: Never Used  . Alcohol Use: No     Comment: Rare   OB History    No data available     Review of Systems  Gastrointestinal: Positive for abdominal pain.  Musculoskeletal: Positive for back pain.      Allergies  Amitriptyline; Baclofen; Cymbalta; Gabapentin; Monosodium glutamate; Vicodin; Alprazolam; Magnesium salicylate; Rizatriptan; Tizanidine; Toradol; Tramadol; Adhesive; and Lamotrigine  Home Medications   Prior to Admission medications   Medication Sig Start Date End Date Taking? Authorizing Provider  Alum & Mag Hydroxide-Simeth (MAGIC MOUTHWASH W/LIDOCAINE) SOLN Take 5 mLs by mouth 3 (three) times daily as needed (For reflux pain). Patient not taking: Reported on 03/07/2014 10/24/13   Tanna Furry, MD  amoxicillin (AMOXIL) 500 MG capsule Take 1,000 mg by  mouth every 12 (twelve) hours. 10 day course starting on 05/26/2014    Historical Provider, MD  celecoxib (CELEBREX) 100 MG capsule Take 1 capsule (100 mg total) by mouth 2 (two) times daily. 11/01/13   Lily Kocher, PA-C  chlorthalidone (HYGROTON) 25 MG tablet Take 1 tablet (25 mg total) by mouth daily. Patient not taking: Reported on 03/07/2014 76/54/65   Delora Fuel, MD  clonazePAM (KLONOPIN) 1 MG tablet Take 1 tablet by mouth 3 (three) times daily. 05/27/14   Historical Provider, MD  cyclobenzaprine (FLEXERIL) 10 MG tablet Take 10 mg by mouth 3 (three) times daily as needed for muscle spasms (muscle spasms).     Historical Provider, MD  diphenhydrAMINE (BENADRYL) 25 MG tablet Take 50 mg by mouth every 8 (eight)  hours as needed for sleep.     Historical Provider, MD  divalproex (DEPAKOTE) 500 MG DR tablet Take 1 tablet (500 mg total) by mouth 2 (two) times daily. 04/14/14   April Palumbo, MD  EPINEPHrine (EPIPEN) 0.3 mg/0.3 mL SOAJ injection Inject 0.3 mg into the muscle as needed (allergic reaction).     Historical Provider, MD  Fingolimod HCl (GILENYA) 0.5 MG CAPS Take 0.5 mg by mouth daily.     Historical Provider, MD  fluticasone (FLONASE) 50 MCG/ACT nasal spray Place 2 sprays into both nostrils daily.    Historical Provider, MD  guaiFENesin-codeine (ROBITUSSIN AC) 100-10 MG/5ML syrup Take 5 mLs by mouth 3 (three) times daily as needed for cough.    Historical Provider, MD  hydrOXYzine (ATARAX/VISTARIL) 25 MG tablet Take 1 tablet (25 mg total) by mouth every 6 (six) hours. 02/05/14   Nicole Pisciotta, PA-C  lactulose (CHRONULAC) 10 GM/15ML solution Take 15 mLs (10 g total) by mouth 2 (two) times daily as needed for mild constipation or moderate constipation. Patient not taking: Reported on 03/07/2014 12/06/13   Daleen Bo, MD  loratadine (CLARITIN) 10 MG tablet Take 10 mg by mouth daily.     Historical Provider, MD  methocarbamol (ROBAXIN) 500 MG tablet Take 1 tablet (500 mg total) by mouth 2 (two) times daily. 06/08/14   Izell Labat Carlota Raspberry, PA-C  omeprazole (PRILOSEC) 40 MG capsule Take 40 mg by mouth daily.     Historical Provider, MD  ondansetron (ZOFRAN) 8 MG tablet Take 8 mg by mouth every 6 (six) hours as needed for nausea or vomiting (nausea).     Historical Provider, MD  oxyCODONE (ROXICODONE) 15 MG immediate release tablet Take 15 mg by mouth every 8 (eight) hours as needed for pain (pain).     Historical Provider, MD  potassium chloride (K-DUR) 10 MEQ tablet Take 10 mEq by mouth 2 (two) times daily.    Historical Provider, MD  pregabalin (LYRICA) 200 MG capsule Take 600 mg by mouth at bedtime.    Historical Provider, MD  promethazine (PHENERGAN) 25 MG tablet Take 25 mg by mouth every 6 (six) hours as  needed for nausea or vomiting.     Historical Provider, MD  propranolol (INDERAL) 60 MG tablet Take 1 tablet (60 mg total) by mouth 2 (two) times daily. 11/23/13   Penni Bombard, MD  QUEtiapine (SEROQUEL) 100 MG tablet Take 100 mg by mouth at bedtime.    Historical Provider, MD  ranitidine (ZANTAC) 300 MG capsule Take 300 mg by mouth at bedtime.     Historical Provider, MD  topiramate (TOPAMAX) 100 MG tablet Take 100 mg by mouth 2 (two) times daily.     Historical Provider, MD  venlafaxine (EFFEXOR) 75 MG tablet Take 75 mg by mouth 3 (three) times daily with meals.    Historical Provider, MD   BP 147/109 mmHg  Pulse 106  Temp(Src) 97.8 F (36.6 C) (Oral)  Resp 20  SpO2 99% Physical Exam  Constitutional: She is oriented to person, place, and time. She appears well-developed and well-nourished.  HENT:  Head: Normocephalic and atraumatic.  Cardiovascular: Normal rate.   Pulmonary/Chest: Effort normal.  Abdominal: She exhibits no distension.  Musculoskeletal:       Back:  Pt ambulating in the hallways and easily stands up and sits down in chair.  Pt has equal strength to bilateral lower extremities.  Neurosensory function adequate to both legs No clonus on dorsiflextion Skin color is normal. Skin is warm and moist.  I see no step off deformity, no midline bony tenderness.  Pt is able to ambulate.  No crepitus, laceration, effusion, induration, lesions, swelling.   Pedal pulses are symmetrical and palpable bilaterally   tenderness to palpation of thoracic paraspinel muscles   Neurological: She is alert and oriented to person, place, and time.  Skin: Skin is warm and dry.  Psychiatric: She has a normal mood and affect.  Nursing note and vitals reviewed.   ED Course  Procedures (including critical care time)  DIAGNOSTIC STUDIES: Oxygen Saturation is 99% on room air, normal by my interpretation.    COORDINATION OF CARE: 5:41 PM Discussed treatment plan with pt at bedside  and pt agreed to plan.   Labs Review Labs Reviewed - No data to display  Imaging Review No results found.   EKG Interpretation None      MDM   Final diagnoses:  Midline thoracic back pain    Patients care plan reviewed - no emergent condition at todays visit or neuro deficits. She is upset and crying because she cannot find her fiance and has no where to go. We have  Given her a phone and called [facility] ER in effort to find him but we have not been able to locate him. She has been informed she is welcome to wait in the waiting room and Case Manager/Social Work has also been consulted for any ideas.  34 y.o.Emily [name]'s  with back pain. No neurological deficits and normal neuro exam. Patient can walk. No loss of bowel or bladder control. No concern for cauda equina at this time base on HPI and physical exam findings. No fever, night sweats, weight loss, h/o cancer, IVDU.   Patient Plan 1. Medications: muscle relaxer. Cont usual home medications unless otherwise directed. 2. Treatment: rest, drink plenty of fluids, gentle stretching as discussed, alternate ice and heat  3. Follow Up: Please followup with your primary doctor for discussion of your diagnoses and further evaluation after today's visit; if you do not have a primary care doctor use the resource guide provided to find one  Advised to follow-up with the orthopedist if symptoms do not start to resolve in the next 2-3 days. If develop loss of bowel or urinary control return to the ED as soon as possible for further evaluation. To take the medications as prescribed as they can cause harm if not taken appropriately.   Vital signs are stable at discharge. Filed Vitals:   06/08/14 1623  BP: 147/109  Pulse:   Temp:   Resp:     Patient/guardian has voiced understanding and agreed to follow-up with the PCP or specialist.    I personally performed the services described  in this documentation, which was scribed in my  presence. The recorded information has been reviewed and is accurate.   [name], PA-C 06/08/14 1751  Everlene Balls, MD 06/09/14 2259

## 2014-06-08 NOTE — Discharge Instructions (Signed)
Back Pain, Adult Low back pain is very common. About 1 in 5 people have back pain.The cause of low back pain is rarely dangerous. The pain often gets better over time.About half of people with a sudden onset of back pain feel better in just 2 weeks. About 8 in 10 people feel better by 6 weeks.  CAUSES Some common causes of back pain include:  Strain of the muscles or ligaments supporting the spine.  Wear and tear (degeneration) of the spinal discs.  Arthritis.  Direct injury to the back. DIAGNOSIS Most of the time, the direct cause of low back pain is not known.However, back pain can be treated effectively even when the exact cause of the pain is unknown.Answering your caregiver's questions about your overall health and symptoms is one of the most accurate ways to make sure the cause of your pain is not dangerous. If your caregiver needs more information, he or she may order lab work or imaging tests (X-rays or MRIs).However, even if imaging tests show changes in your back, this usually does not require surgery. HOME CARE INSTRUCTIONS For many people, back pain returns.Since low back pain is rarely dangerous, it is often a condition that people can learn to manageon their own.   Remain active. It is stressful on the back to sit or stand in one place. Do not sit, drive, or stand in one place for more than 30 minutes at a time. Take short walks on level surfaces as soon as pain allows.Try to increase the length of time you walk each day.  Do not stay in bed.Resting more than 1 or 2 days can delay your recovery.  Do not avoid exercise or work.Your body is made to move.It is not dangerous to be active, even though your back may hurt.Your back will likely heal faster if you return to being active before your pain is gone.  Pay attention to your body when you bend and lift. Many people have less discomfortwhen lifting if they bend their knees, keep the load close to their bodies,and  avoid twisting. Often, the most comfortable positions are those that put less stress on your recovering back.  Find a comfortable position to sleep. Use a firm mattress and lie on your side with your knees slightly bent. If you lie on your back, put a pillow under your knees.  Only take over-the-counter or prescription medicines as directed by your caregiver. Over-the-counter medicines to reduce pain and inflammation are often the most helpful.Your caregiver may prescribe muscle relaxant drugs.These medicines help dull your pain so you can more quickly return to your normal activities and healthy exercise.  Put ice on the injured area.  Put ice in a plastic bag.  Place a towel between your skin and the bag.  Leave the ice on for 15-20 minutes, 03-04 times a day for the first 2 to 3 days. After that, ice and heat may be alternated to reduce pain and spasms.  Ask your caregiver about trying back exercises and gentle massage. This may be of some benefit.  Avoid feeling anxious or stressed.Stress increases muscle tension and can worsen back pain.It is important to recognize when you are anxious or stressed and learn ways to manage it.Exercise is a great option. SEEK MEDICAL CARE IF:  You have pain that is not relieved with rest or medicine.  You have pain that does not improve in 1 week.  You have new symptoms.  You are generally not feeling well. SEEK   IMMEDIATE MEDICAL CARE IF:   You have pain that radiates from your back into your legs.  You develop new bowel or bladder control problems.  You have unusual weakness or numbness in your arms or legs.  You develop nausea or vomiting.  You develop abdominal pain.  You feel faint. Document Released: 12/24/2004 Document Revised: 06/25/2011 Document Reviewed: 04/27/2013 Exit[employer] Patient Information 2015 [employer]. This information is not intended to replace advice given to you by your health care provider. Make sure you  discuss any questions you have with your health care provider.  

## 2014-06-08 NOTE — ED Notes (Signed)
PA aware of BP; pt appears anxious; PA reports continue to discharge as planned/written.
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
## 2014-06-20 NOTE — ED Notes (Signed)
No answer when called 

## 2014-06-25 ENCOUNTER — Emergency Department (HOSPITAL_COMMUNITY): Payer: Medicare Other

## 2014-06-25 ENCOUNTER — Emergency Department (HOSPITAL_COMMUNITY)
Admission: EM | Admit: 2014-06-25 | Discharge: 2014-06-25 | Discharge status: Against Medical Advice | Payer: Medicare Other | Attending: Emergency Medicine | Admitting: Emergency Medicine

## 2014-06-25 ENCOUNTER — Encounter (HOSPITAL_COMMUNITY): Payer: Self-pay | Admitting: *Deleted

## 2014-06-25 DIAGNOSIS — S4992XA Unspecified injury of left shoulder and upper arm, initial encounter: Secondary | ICD-10-CM | POA: Insufficient documentation

## 2014-06-25 DIAGNOSIS — F410 Panic disorder [episodic paroxysmal anxiety] without agoraphobia: Secondary | ICD-10-CM | POA: Diagnosis not present

## 2014-06-25 DIAGNOSIS — Z7951 Long term (current) use of inhaled steroids: Secondary | ICD-10-CM | POA: Insufficient documentation

## 2014-06-25 DIAGNOSIS — Z8742 Personal history of other diseases of the female genital tract: Secondary | ICD-10-CM | POA: Diagnosis not present

## 2014-06-25 DIAGNOSIS — K219 Gastro-esophageal reflux disease without esophagitis: Secondary | ICD-10-CM | POA: Insufficient documentation

## 2014-06-25 DIAGNOSIS — S199XXA Unspecified injury of neck, initial encounter: Secondary | ICD-10-CM | POA: Diagnosis not present

## 2014-06-25 DIAGNOSIS — Z720 Tobacco use: Secondary | ICD-10-CM | POA: Diagnosis not present

## 2014-06-25 DIAGNOSIS — S299XXA Unspecified injury of thorax, initial encounter: Secondary | ICD-10-CM | POA: Diagnosis not present

## 2014-06-25 DIAGNOSIS — G8929 Other chronic pain: Secondary | ICD-10-CM | POA: Diagnosis not present

## 2014-06-25 DIAGNOSIS — Y998 Other external cause status: Secondary | ICD-10-CM | POA: Insufficient documentation

## 2014-06-25 DIAGNOSIS — S8992XA Unspecified injury of left lower leg, initial encounter: Secondary | ICD-10-CM | POA: Insufficient documentation

## 2014-06-25 DIAGNOSIS — Z9089 Acquired absence of other organs: Secondary | ICD-10-CM | POA: Insufficient documentation

## 2014-06-25 DIAGNOSIS — S3992XA Unspecified injury of lower back, initial encounter: Secondary | ICD-10-CM | POA: Diagnosis not present

## 2014-06-25 DIAGNOSIS — F329 Major depressive disorder, single episode, unspecified: Secondary | ICD-10-CM | POA: Diagnosis not present

## 2014-06-25 DIAGNOSIS — Y9289 Other specified places as the place of occurrence of the external cause: Secondary | ICD-10-CM | POA: Diagnosis not present

## 2014-06-25 DIAGNOSIS — Z3202 Encounter for pregnancy test, result negative: Secondary | ICD-10-CM | POA: Diagnosis not present

## 2014-06-25 DIAGNOSIS — Z79899 Other long term (current) drug therapy: Secondary | ICD-10-CM | POA: Diagnosis not present

## 2014-06-25 DIAGNOSIS — G43909 Migraine, unspecified, not intractable, without status migrainosus: Secondary | ICD-10-CM | POA: Insufficient documentation

## 2014-06-25 DIAGNOSIS — Y9389 Activity, other specified: Secondary | ICD-10-CM | POA: Insufficient documentation

## 2014-06-25 DIAGNOSIS — S0990XA Unspecified injury of head, initial encounter: Secondary | ICD-10-CM | POA: Diagnosis not present

## 2014-06-25 DIAGNOSIS — S3991XA Unspecified injury of abdomen, initial encounter: Secondary | ICD-10-CM | POA: Diagnosis present

## 2014-06-25 DIAGNOSIS — S59902A Unspecified injury of left elbow, initial encounter: Secondary | ICD-10-CM | POA: Insufficient documentation

## 2014-06-25 DIAGNOSIS — J45909 Unspecified asthma, uncomplicated: Secondary | ICD-10-CM | POA: Insufficient documentation

## 2014-06-25 DIAGNOSIS — R109 Unspecified abdominal pain: Secondary | ICD-10-CM

## 2014-06-25 LAB — BASIC METABOLIC PANEL
Anion gap: 13 (ref 5–15)
BUN: 6 mg/dL (ref 6–20)
CALCIUM: 8.9 mg/dL (ref 8.9–10.3)
CO2: 22 mmol/L (ref 22–32)
CREATININE: 0.82 mg/dL (ref 0.44–1.00)
Chloride: 104 mmol/L (ref 101–111)
GFR calc Af Amer: >60 mL/min (ref >60)
GFR calc non Af Amer: >60 mL/min (ref >60)
GLUCOSE: 103 mg/dL — AB (ref 65–99)
Potassium: 3.2 mmol/L — ABNORMAL LOW (ref 3.5–5.1)
Sodium: 139 mmol/L (ref 135–145)

## 2014-06-25 LAB — CBC WITH DIFFERENTIAL/PLATELET
Basophils Absolute: 0 10*3/uL (ref 0.0–0.1)
Basophils Relative: 0 % (ref 0–1)
EOS PCT: 1 % (ref 0–5)
Eosinophils Absolute: 0.1 10*3/uL (ref 0.0–0.7)
HEMATOCRIT: 44.5 % (ref 36.0–46.0)
HEMOGLOBIN: 15.4 g/dL — AB (ref 12.0–15.0)
LYMPHS ABS: 2.6 10*3/uL (ref 0.7–4.0)
Lymphocytes Relative: 33 % (ref 12–46)
MCH: 30.3 pg (ref 26.0–34.0)
MCHC: 34.6 g/dL (ref 30.0–36.0)
MCV: 87.6 fL (ref 78.0–100.0)
MONOS PCT: 7 % (ref 3–12)
Monocytes Absolute: 0.6 10*3/uL (ref 0.1–1.0)
NEUTROS PCT: 59 % (ref 43–77)
Neutro Abs: 4.6 10*3/uL (ref 1.7–7.7)
Platelets: 347 10*3/uL (ref 150–400)
RBC: 5.08 MIL/uL (ref 3.87–5.11)
RDW: 13.6 % (ref 11.5–15.5)
WBC: 7.8 10*3/uL (ref 4.0–10.5)

## 2014-06-25 LAB — POC URINE PREG, ED: PREG TEST UR: NEGATIVE

## 2014-06-25 MED ORDER — IOHEXOL 300 MG/ML  SOLN
100.0000 mL | Freq: Once | INTRAMUSCULAR | Status: AC | PRN
  Administered 2014-06-25: 100 mL via INTRAVENOUS

## 2014-06-25 MED ORDER — IOHEXOL 300 MG/ML  SOLN
25.0000 mL | Freq: Once | INTRAMUSCULAR | Status: AC | PRN
  Administered 2014-06-25: 25 mL via ORAL

## 2014-06-25 MED ORDER — LORAZEPAM 2 MG/ML IJ SOLN
1.0000 mg | Freq: Once | INTRAMUSCULAR | Status: AC
  Administered 2014-06-25: 1 mg via INTRAVENOUS
  Filled 2014-06-25: qty 1

## 2014-06-25 NOTE — ED Notes (Signed)
Patient transported to CT 
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
## 2014-06-25 NOTE — ED Notes (Signed)
Pt returned from CT °
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
## 2014-06-25 NOTE — Discharge Instructions (Signed)
Assault, General  Assault includes any behavior, whether intentional or reckless, which results in bodily injury to another person and/or damage to property. Included in this would be any behavior, intentional or reckless, that by its nature would be understood (interpreted) by a reasonable person as intent to harm another person or to damage his/her property. Threats may be oral or written. They may be communicated through regular mail, computer, fax, or phone. These threats may be direct or implied.  FORMS OF ASSAULT INCLUDE:  · Physically assaulting a person. This includes physical threats to inflict physical harm as well as:  ¨ Slapping.  ¨ Hitting.  ¨ Poking.  ¨ Kicking.  ¨ Punching.  ¨ Pushing.  · Arson.  · Sabotage.  · Equipment vandalism.  · Damaging or destroying property.  · Throwing or hitting objects.  · Displaying a weapon or an object that appears to be a weapon in a threatening manner.  ¨ Carrying a firearm of any kind.  ¨ Using a weapon to harm someone.  · Using greater physical size/strength to intimidate another.  ¨ Making intimidating or threatening gestures.  ¨ Bullying.  ¨ Hazing.  · Intimidating, threatening, hostile, or abusive language directed toward another person.  ¨ It communicates the intention to engage in violence against that person. And it leads a reasonable person to expect that violent behavior may occur.  · Stalking another person.  IF IT HAPPENS AGAIN:  · Immediately call for emergency help (911 in [place]  · If someone poses clear and immediate danger to you, seek legal authorities to have a protective or restraining order put in place.  · Less threatening assaults can at least be reported to authorities.  STEPS TO TAKE IF A SEXUAL ASSAULT HAS HAPPENED  · Go to an area of safety. This may include a shelter or staying with a friend. Stay away from the area where you have been attacked. A large percentage of sexual assaults are caused by a friend, relative or associate.  · If  medications were given by your caregiver, take them as directed for the full length of time prescribed.  · Only take over-the-counter or prescription medicines for pain, discomfort, or fever as directed by your caregiver.  · If you have come in contact with a sexual disease, find out if you are to be tested again. If your caregiver is concerned about the HIV/AIDS virus, he/she may require you to have continued testing for several months.  · For the protection of your privacy, test results can not be given over the phone. Make sure you receive the results of your test. If your test results are not back during your visit, make an appointment with your caregiver to find out the results. Do not assume everything is normal if you have not heard from your caregiver or the medical facility. It is important for you to follow up on all of your test results.  · File appropriate papers with authorities. This is important in all assaults, even if it has occurred in a family or by a friend.  SEEK MEDICAL CARE IF:  · You have new problems because of your injuries.  · You have problems that may be because of the medicine you are taking, such as:  ¨ Rash.  ¨ Itching.  ¨ Swelling.  ¨ Trouble breathing.  · You develop belly (abdominal) pain, feel sick to your stomach (nausea) or are vomiting.  · You begin to run a temperature.  · You   need supportive care or referral to a rape crisis center. These are centers with trained personnel who can help you get through this ordeal.  SEEK IMMEDIATE MEDICAL CARE IF:  · You are afraid of being threatened, beaten, or abused. In [place] call 911.  · You receive new injuries related to abuse.  · You develop severe pain in any area injured in the assault or have any change in your condition that concerns you.  · You faint or lose consciousness.  · You develop chest pain or shortness of breath.  Document Released: 12/24/2004 Document Revised: 03/18/2011 Document Reviewed: 08/12/2007  [employer]® Patient  Information ©2015 [employer]. This information is not intended to replace advice given to you by your health care provider. Make sure you discuss any questions you have with your health care provider.
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
## 2014-06-30 ENCOUNTER — Emergency Department (HOSPITAL_COMMUNITY)
Admission: EM | Admit: 2014-06-30 | Discharge: 2014-06-30 | Disposition: A | Discharge status: Home / Self Care | Payer: Medicare Other | Attending: Emergency Medicine | Admitting: Emergency Medicine

## 2014-06-30 ENCOUNTER — Emergency Department (HOSPITAL_COMMUNITY): Payer: Medicare Other

## 2014-06-30 ENCOUNTER — Encounter (HOSPITAL_COMMUNITY): Payer: Self-pay | Admitting: *Deleted

## 2014-06-30 DIAGNOSIS — W208XXA Other cause of strike by thrown, projected or falling object, initial encounter: Secondary | ICD-10-CM | POA: Diagnosis not present

## 2014-06-30 DIAGNOSIS — J45909 Unspecified asthma, uncomplicated: Secondary | ICD-10-CM | POA: Diagnosis not present

## 2014-06-30 DIAGNOSIS — G8929 Other chronic pain: Secondary | ICD-10-CM | POA: Insufficient documentation

## 2014-06-30 DIAGNOSIS — Y998 Other external cause status: Secondary | ICD-10-CM | POA: Diagnosis not present

## 2014-06-30 DIAGNOSIS — F410 Panic disorder [episodic paroxysmal anxiety] without agoraphobia: Secondary | ICD-10-CM | POA: Diagnosis not present

## 2014-06-30 DIAGNOSIS — Z791 Long term (current) use of non-steroidal anti-inflammatories (NSAID): Secondary | ICD-10-CM | POA: Diagnosis not present

## 2014-06-30 DIAGNOSIS — Z8742 Personal history of other diseases of the female genital tract: Secondary | ICD-10-CM | POA: Insufficient documentation

## 2014-06-30 DIAGNOSIS — Z720 Tobacco use: Secondary | ICD-10-CM | POA: Diagnosis not present

## 2014-06-30 DIAGNOSIS — Y9389 Activity, other specified: Secondary | ICD-10-CM | POA: Diagnosis not present

## 2014-06-30 DIAGNOSIS — K219 Gastro-esophageal reflux disease without esophagitis: Secondary | ICD-10-CM | POA: Diagnosis not present

## 2014-06-30 DIAGNOSIS — G43909 Migraine, unspecified, not intractable, without status migrainosus: Secondary | ICD-10-CM | POA: Insufficient documentation

## 2014-06-30 DIAGNOSIS — Y929 Unspecified place or not applicable: Secondary | ICD-10-CM | POA: Diagnosis not present

## 2014-06-30 DIAGNOSIS — S9032XA Contusion of left foot, initial encounter: Secondary | ICD-10-CM | POA: Insufficient documentation

## 2014-06-30 DIAGNOSIS — Z7951 Long term (current) use of inhaled steroids: Secondary | ICD-10-CM | POA: Insufficient documentation

## 2014-06-30 DIAGNOSIS — S99922A Unspecified injury of left foot, initial encounter: Secondary | ICD-10-CM | POA: Diagnosis present

## 2014-06-30 NOTE — Discharge Instructions (Signed)
Keep foot elevated and ice frequently to control swelling. You may take Tylenol or home pain medications. Bear weight as tolerated.    Foot Contusion A foot contusion is a deep bruise to the foot. Contusions are the result of an injury that caused bleeding under the skin. The contusion may turn blue, purple, or yellow. Minor injuries will give you a painless contusion, but more severe contusions may stay painful and swollen for a few weeks. CAUSES  A foot contusion comes from a direct blow to that area, such as a heavy object falling on the foot. SYMPTOMS   Swelling of the foot.  Discoloration of the foot.  Tenderness or soreness of the foot. DIAGNOSIS  You will have a physical exam and will be asked about your history. You may need an X-ray of your foot to look for a broken bone (fracture).  TREATMENT  An elastic wrap may be recommended to support your foot. Resting, elevating, and applying cold compresses to your foot are often the best treatments for a foot contusion. Over-the-counter medicines may also be recommended for pain control. HOME CARE INSTRUCTIONS   Put ice on the injured area.  Put ice in a plastic bag.  Place a towel between your skin and the bag.  Leave the ice on for 15-20 minutes, 03-04 times a day.  Only take over-the-counter or prescription medicines for pain, discomfort, or fever as directed by your caregiver.  If told, use an elastic wrap as directed. This can help reduce swelling. You may remove the wrap for sleeping, showering, and bathing. If your toes become numb, cold, or blue, take the wrap off and reapply it more loosely.  Elevate your foot with pillows to reduce swelling.  Try to avoid standing or walking while the foot is painful. Do not resume use until instructed by your caregiver. Then, begin use gradually. If pain develops, decrease use. Gradually increase activities that do not cause discomfort until you have normal use of your foot.  See your  caregiver as directed. It is very important to keep all follow-up appointments in order to avoid any lasting problems with your foot, including long-term (chronic) pain. SEEK IMMEDIATE MEDICAL CARE IF:   You have increased redness, swelling, or pain in your foot.  Your swelling or pain is not relieved with medicines.  You have loss of feeling in your foot or are unable to move your toes.  Your foot turns cold or blue.  You have pain when you move your toes.  Your foot becomes warm to the touch.  Your contusion does not improve in 2 days. MAKE SURE YOU:   Understand these instructions.  Will watch your condition.  Will get help right away if you are not doing well or get worse. Document Released: 10/15/2005 Document Revised: 06/25/2011 Document Reviewed: 11/27/2010 [facility] Patient Information 2015 [place], [place]. This information is not intended to replace advice given to you by your health care provider. Make sure you discuss any questions you have with your health care provider.

## 2014-06-30 NOTE — ED Provider Notes (Signed)
CSN: [id number]     Arrival date & time 06/30/14  0059 History  This chart was scribed for Emily Rice, MD by Delphia Grates, ED Scribe. This patient was seen in room [facility] and the patient's care was started at 2:50 AM.   Chief Complaint  Patient presents with  . Foot Injury    The history is provided by the patient. No language interpreter was used.     HPI Comments: Emily [name] is a 34 y.o. female who presents to the Emergency Department complaining of a left foot injury that occurred when someone accidentally dropped a pile of books on top of her foot approximately 12 hours ago. Patient reports sudden onset, gradually worsening, left foot pain with associated bruising. Though initially able to ambulate, states she has been unable to bear weight due to pain. She denies numbness, weakness, or any other injuries.  Patient has 20 ED visits in the last 6 months.  Past Medical History  Diagnosis Date  . Headache(784.0)     migraines  . Multiple sclerosis   . Asthma as a child  . GERD (gastroesophageal reflux disease)   . Urinary urgency   . Anxiety   . Perforated bowel 2009  . Migraines   . Depression   . Chronic back pain   . Chronic pain   . Ovarian cyst   . Panic attack   . Pseudoseizures    Past Surgical History  Procedure Laterality Date  . Extremity cyst excision  1994    right leg  . 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 Colostomy closure  04/2007  . Scar revision  01/21/2011    Procedure: SCAR REVISION;  Surgeon: Hermelinda Dellen;  Location: [place];  Service: Plastics;  Laterality: N/A;  exploration of scar of abdomen and repair of defect  . Hernia repair    . Abdominal surgery    . Appendectomy     Family History  Problem Relation Age of Onset  . Diabetes Mother   . Hypertension Mother   . Diabetes Father   . Hypertension Father    History  Substance Use Topics  . Smoking status: Current Some Day Smoker -- 1.00  packs/day for 10 years    Types: Cigarettes  . Smokeless tobacco: Never Used  . Alcohol Use: No     Comment: Rare   OB History    No data available     Review of Systems  Musculoskeletal: Positive for arthralgias.  Skin: Positive for color change and wound.  Neurological: Negative for weakness and numbness.  All other systems reviewed and are negative.     Allergies  Amitriptyline; Baclofen; Cymbalta; Gabapentin; Monosodium glutamate; Vicodin; Alprazolam; Magnesium salicylate; Rizatriptan; Tizanidine; Toradol; Tramadol; Adhesive; and Lamotrigine  Home Medications   Prior to Admission medications   Medication Sig Start Date End Date Taking? Authorizing Provider  Alum & Mag Hydroxide-Simeth (MAGIC MOUTHWASH W/LIDOCAINE) SOLN Take 5 mLs by mouth 3 (three) times daily as needed (For reflux pain). Patient not taking: Reported on 03/07/2014 10/24/13   Tanna Furry, MD  celecoxib (CELEBREX) 100 MG capsule Take 1 capsule (100 mg total) by mouth 2 (two) times daily. 11/01/13   Lily Kocher, PA-C  chlorthalidone (HYGROTON) 25 MG tablet Take 1 tablet (25 mg total) by mouth daily. Patient not taking: Reported on 03/07/2014 81/85/63   Delora Fuel, MD  clonazePAM (KLONOPIN) 1 MG tablet Take 1 tablet by mouth 3 (three) times daily.  05/27/14   Historical Provider, MD  cyclobenzaprine (FLEXERIL) 10 MG tablet Take 10 mg by mouth 3 (three) times daily as needed for muscle spasms (muscle spasms).     Historical Provider, MD  divalproex (DEPAKOTE) 500 MG DR tablet Take 1 tablet (500 mg total) by mouth 2 (two) times daily. 04/14/14   April Palumbo, MD  EPINEPHrine (EPIPEN) 0.3 mg/0.3 mL SOAJ injection Inject 0.3 mg into the muscle as needed (allergic reaction).     Historical Provider, MD  Fingolimod HCl (GILENYA) 0.5 MG CAPS Take 0.5 mg by mouth daily.     Historical Provider, MD  fluticasone (FLONASE) 50 MCG/ACT nasal spray Place 2 sprays into both nostrils daily.    Historical Provider, MD  hydrOXYzine  (ATARAX/VISTARIL) 25 MG tablet Take 1 tablet (25 mg total) by mouth every 6 (six) hours. 02/05/14   Nicole Pisciotta, PA-C  lactulose (CHRONULAC) 10 GM/15ML solution Take 15 mLs (10 g total) by mouth 2 (two) times daily as needed for mild constipation or moderate constipation. Patient not taking: Reported on 03/07/2014 12/06/13   Daleen Bo, MD  loratadine (CLARITIN) 10 MG tablet Take 10 mg by mouth daily.     Historical Provider, MD  methocarbamol (ROBAXIN) 500 MG tablet Take 1 tablet (500 mg total) by mouth 2 (two) times daily. 06/08/14   Tiffany Carlota Raspberry, PA-C  omeprazole (PRILOSEC) 40 MG capsule Take 40 mg by mouth daily.     Historical Provider, MD  oxyCODONE (ROXICODONE) 15 MG immediate release tablet Take 15 mg by mouth every 8 (eight) hours as needed for pain (pain).     Historical Provider, MD  potassium chloride (K-DUR) 10 MEQ tablet Take 10 mEq by mouth 2 (two) times daily.    Historical Provider, MD  pregabalin (LYRICA) 200 MG capsule Take 600 mg by mouth at bedtime.    Historical Provider, MD  propranolol (INDERAL) 60 MG tablet Take 1 tablet (60 mg total) by mouth 2 (two) times daily. 11/23/13   Penni Bombard, MD  QUEtiapine (SEROQUEL) 100 MG tablet Take 100 mg by mouth at bedtime.    Historical Provider, MD  ranitidine (ZANTAC) 300 MG capsule Take 300 mg by mouth at bedtime.     Historical Provider, MD  topiramate (TOPAMAX) 100 MG tablet Take 100 mg by mouth 2 (two) times daily.     Historical Provider, MD  venlafaxine (EFFEXOR) 75 MG tablet Take 75 mg by mouth 3 (three) times daily with meals.    Historical Provider, MD   Triage Vitals: BP 135/104 mmHg  Pulse 97  Temp(Src) 98.1 F (36.7 C) (Oral)  Resp 16  SpO2 97%  LMP 06/22/2014  Physical Exam  Constitutional: She is oriented to person, place, and time. She appears well-developed and well-nourished. No distress.  HENT:  Head: Normocephalic and atraumatic.  Mouth/Throat: Oropharynx is clear and moist.  Eyes: EOM are  normal. Pupils are equal, round, and reactive to light.  Neck: Normal range of motion. Neck supple.  Cardiovascular: Normal rate and regular rhythm.   Pulmonary/Chest: Effort normal.  Abdominal: Soft.  Musculoskeletal: Normal range of motion. She exhibits tenderness. She exhibits no edema.  Patient with contusion over the dorsum of the left foot. There is no obvious deformity. There is mild swelling. She has good distal cap refill. Sensation is intact.  Neurological: She is alert and oriented to person, place, and time.  5/5 motor in all extremities. Sensation fully intact.  Skin: Skin is warm and dry. No rash noted. No  erythema.  Psychiatric: She has a normal mood and affect. Her behavior is normal.  Nursing note and vitals reviewed.   ED Course  Procedures (including critical care time)  DIAGNOSTIC STUDIES: Oxygen Saturation is 97% on room air, adequate by my interpretation.    COORDINATION OF CARE: At 26 Discussed treatment plan with patient. Patient agrees.   Labs Review Labs Reviewed - No data to display  Imaging Review Dg Foot Complete Left  06/30/2014   CLINICAL DATA:  40 lb bag of books fell onto left foot, with pain and bruising at the metatarsals and tarsals. Initial encounter.  EXAM: LEFT FOOT - COMPLETE 3+ VIEW  COMPARISON:  None.  FINDINGS: There is no evidence of fracture or dislocation. The joint spaces are preserved. There is no evidence of talar subluxation; the subtalar joint is unremarkable in appearance. Mild cortical irregularity at the dorsal aspect of the navicular likely reflects degenerative change. A small plantar calcaneal spur is seen.  Mild dorsal soft tissue swelling is seen at the forefoot.  IMPRESSION: No evidence of fracture or dislocation.   Electronically Signed   By: Garald Balding M.D.   On: 06/30/2014 02:55     EKG Interpretation None      MDM   Final diagnoses:  Foot contusion, left, initial encounter    I personally performed the  services described in this documentation, which was scribed in my presence. The recorded information has been reviewed and is accurate. No evidence of fracture on x-ray. We'll treat for foot contusion. Recommended elevation and ice. Weightbearing as tolerated.  Emily Rice, MD 06/30/14 [phone]

## 2014-06-30 NOTE — Progress Notes (Signed)
Orthopedic Tech Progress Note Patient Details:  Emily [name] 1980/09/27 [id number]  Ortho Devices Type of Ortho Device: Crutches, Postop shoe/boot Ortho Device/Splint Interventions: Application   Cammer, Theodoro Parma 06/30/2014, 3:22 AM

## 2014-06-30 NOTE — ED Notes (Signed)
Pt reports left foot injury from a stack of books being dropped on her left foot. Pt presents with bruise to left foot and unable to bear any weight.

## 2014-06-30 NOTE — Progress Notes (Signed)
Orthopedic Tech Progress Note Patient Details:  Emily [name] [birth year] [id number]  Ortho Devices Type of Ortho Device: Crutches, Postop shoe/boot Ortho Device/Splint Interventions: Application   Cammer, Theodoro Parma 06/30/2014, 3:22 AM

## 2014-07-18 ENCOUNTER — Emergency Department (HOSPITAL_COMMUNITY)
Admission: EM | Admit: 2014-07-18 | Discharge: 2014-07-18 | Discharge status: Against Medical Advice | Payer: Medicare Other | Attending: Emergency Medicine | Admitting: Emergency Medicine

## 2014-07-18 ENCOUNTER — Encounter (HOSPITAL_COMMUNITY): Payer: Self-pay | Admitting: Emergency Medicine

## 2014-07-18 DIAGNOSIS — G8929 Other chronic pain: Secondary | ICD-10-CM | POA: Diagnosis not present

## 2014-07-18 DIAGNOSIS — R350 Frequency of micturition: Secondary | ICD-10-CM | POA: Diagnosis not present

## 2014-07-18 DIAGNOSIS — R109 Unspecified abdominal pain: Secondary | ICD-10-CM | POA: Diagnosis present

## 2014-07-18 DIAGNOSIS — Z720 Tobacco use: Secondary | ICD-10-CM | POA: Insufficient documentation

## 2014-07-18 DIAGNOSIS — J45909 Unspecified asthma, uncomplicated: Secondary | ICD-10-CM | POA: Insufficient documentation

## 2014-07-18 NOTE — ED Notes (Signed)
Patient asked for urine sample. She states that she is unable to give sample at this time. Patient given urine cup.

## 2014-07-18 NOTE — ED Notes (Signed)
Pt. reports right flank pain with urinary frequency for 2 days , denies nausea or vomitting / no hematuria or fever.

## 2014-07-28 DIAGNOSIS — M5030 Other cervical disc degeneration, unspecified cervical region: Secondary | ICD-10-CM | POA: Insufficient documentation

## 2014-08-25 ENCOUNTER — Other Ambulatory Visit: Payer: Self-pay | Admitting: Family Medicine

## 2014-08-25 ENCOUNTER — Ambulatory Visit
Admission: RE | Admit: 2014-08-25 | Discharge: 2014-08-25 | Disposition: A | Discharge status: Home / Self Care | Payer: Medicare Other | Source: Ambulatory Visit | Attending: Family Medicine | Admitting: Family Medicine

## 2014-08-25 DIAGNOSIS — M79642 Pain in left hand: Secondary | ICD-10-CM

## 2014-08-25 DIAGNOSIS — S63502A Unspecified sprain of left wrist, initial encounter: Secondary | ICD-10-CM

## 2014-11-08 ENCOUNTER — Ambulatory Visit: Payer: Medicare Other | Admitting: Occupational Therapy

## 2014-11-21 ENCOUNTER — Ambulatory Visit: Payer: Medicare Other | Attending: Family Medicine | Admitting: Occupational Therapy

## 2014-11-29 ENCOUNTER — Encounter (HOSPITAL_COMMUNITY): Payer: Self-pay | Admitting: Emergency Medicine

## 2014-11-29 ENCOUNTER — Emergency Department (HOSPITAL_COMMUNITY)
Admission: EM | Admit: 2014-11-29 | Discharge: 2014-11-29 | Disposition: A | Discharge status: Home / Self Care | Payer: Medicare Other | Attending: Emergency Medicine | Admitting: Emergency Medicine

## 2014-11-29 DIAGNOSIS — R6884 Jaw pain: Secondary | ICD-10-CM

## 2014-11-29 DIAGNOSIS — F419 Anxiety disorder, unspecified: Secondary | ICD-10-CM | POA: Insufficient documentation

## 2014-11-29 DIAGNOSIS — G8929 Other chronic pain: Secondary | ICD-10-CM | POA: Insufficient documentation

## 2014-11-29 DIAGNOSIS — J45901 Unspecified asthma with (acute) exacerbation: Secondary | ICD-10-CM | POA: Diagnosis not present

## 2014-11-29 DIAGNOSIS — Z79899 Other long term (current) drug therapy: Secondary | ICD-10-CM | POA: Insufficient documentation

## 2014-11-29 DIAGNOSIS — G43909 Migraine, unspecified, not intractable, without status migrainosus: Secondary | ICD-10-CM | POA: Insufficient documentation

## 2014-11-29 DIAGNOSIS — Z7951 Long term (current) use of inhaled steroids: Secondary | ICD-10-CM | POA: Diagnosis not present

## 2014-11-29 DIAGNOSIS — K219 Gastro-esophageal reflux disease without esophagitis: Secondary | ICD-10-CM | POA: Insufficient documentation

## 2014-11-29 DIAGNOSIS — F410 Panic disorder [episodic paroxysmal anxiety] without agoraphobia: Secondary | ICD-10-CM | POA: Insufficient documentation

## 2014-11-29 DIAGNOSIS — F17210 Nicotine dependence, cigarettes, uncomplicated: Secondary | ICD-10-CM | POA: Diagnosis not present

## 2014-11-29 DIAGNOSIS — F329 Major depressive disorder, single episode, unspecified: Secondary | ICD-10-CM | POA: Insufficient documentation

## 2014-11-29 DIAGNOSIS — Z8742 Personal history of other diseases of the female genital tract: Secondary | ICD-10-CM | POA: Diagnosis not present

## 2014-11-29 MED ORDER — DIPHENHYDRAMINE HCL 25 MG PO CAPS
50.0000 mg | ORAL_CAPSULE | Freq: Once | ORAL | Status: AC
  Administered 2014-11-29: 50 mg via ORAL
  Filled 2014-11-29: qty 2

## 2014-11-29 MED ORDER — DIAZEPAM 5 MG PO TABS
5.0000 mg | ORAL_TABLET | Freq: Once | ORAL | Status: AC
  Administered 2014-11-29: 5 mg via ORAL
  Filled 2014-11-29: qty 1

## 2014-11-29 NOTE — Discharge Instructions (Signed)

## 2014-11-29 NOTE — ED Notes (Signed)
Pt states at 1230pm her jaw started clenching and tightening. Pt has jaw clenched at triage but will unclenched to talk and answer questions. Pt states when jaw clenches she cant control it and bites her cheek.

## 2014-11-29 NOTE — ED Provider Notes (Signed)
CSN: [id number]     Arrival date & time 11/29/14  1557 History   First MD Initiated Contact with Patient 11/29/14 1620     Chief Complaint  Patient presents with  . Jaw Pain     (Consider location/radiation/quality/duration/timing/severity/associated sxs/prior Treatment) Patient is a 34 y.o. female presenting with general illness.  Illness Location:  Bil jaw Quality:  Aching, clenching Severity:  Moderate Onset quality:  Sudden Duration:  4 hours Timing:  Constant Progression:  Unchanged Chronicity:  New Context:  Ho MS, conversion do Relieved by:  Nothing Worsened by:  Nothing Associated symptoms: shortness of breath (in waiting room)   Associated symptoms: no abdominal pain, no chest pain, no fever, no nausea and no vomiting     Past Medical History  Diagnosis Date  . Headache(784.0)     migraines  . Multiple sclerosis ([place])   . Asthma as a child  . GERD (gastroesophageal reflux disease)   . Urinary urgency   . Anxiety   . Perforated bowel ([place]) 2009  . Migraines   . Depression   . Chronic back pain   . Chronic pain   . Ovarian cyst   . Panic attack   . Pseudoseizures    Past Surgical History  Procedure Laterality Date  . Extremity cyst excision  1994    right leg  . 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 Colostomy closure  04/2007  . Scar revision  01/21/2011    Procedure: SCAR REVISION;  Surgeon: Hermelinda Dellen;  Location: [place];  Service: Plastics;  Laterality: N/A;  exploration of scar of abdomen and repair of defect  . Hernia repair    . Abdominal surgery    . Appendectomy     Family History  Problem Relation Age of Onset  . Diabetes Mother   . Hypertension Mother   . Diabetes Father   . Hypertension Father    Social History  Substance Use Topics  . Smoking status: Current Some Day Smoker -- 1.00 packs/day for 10 years    Types: Cigarettes  . Smokeless tobacco: Never Used  . Alcohol Use: No     Comment: Rare    OB History    No data available     Review of Systems  Constitutional: Negative for fever.  Respiratory: Positive for shortness of breath (in waiting room).   Cardiovascular: Negative for chest pain.  Gastrointestinal: Negative for nausea, vomiting and abdominal pain.  All other systems reviewed and are negative.     Allergies  Amitriptyline; Baclofen; Cymbalta; Gabapentin; Monosodium glutamate; Vicodin; Alprazolam; Magnesium salicylate; Rizatriptan; Tizanidine; Toradol; Tramadol; Adhesive; and Lamotrigine  Home Medications   Prior to Admission medications   Medication Sig Start Date End Date Taking? Authorizing Provider  Alum & Mag Hydroxide-Simeth (MAGIC MOUTHWASH W/LIDOCAINE) SOLN Take 5 mLs by mouth 3 (three) times daily as needed (For reflux pain). Patient not taking: Reported on 03/07/2014 10/24/13   Tanna Furry, MD  celecoxib (CELEBREX) 100 MG capsule Take 1 capsule (100 mg total) by mouth 2 (two) times daily. 11/01/13   Lily Kocher, PA-C  chlorthalidone (HYGROTON) 25 MG tablet Take 1 tablet (25 mg total) by mouth daily. Patient not taking: Reported on 03/07/2014 [id number]   Delora Fuel, MD  clonazePAM (KLONOPIN) 1 MG tablet Take 1 tablet by mouth 3 (three) times daily. 05/27/14   Historical Provider, MD  cyclobenzaprine (FLEXERIL) 10 MG tablet Take 10 mg by mouth 3 (three) times daily  as needed for muscle spasms (muscle spasms).     Historical Provider, MD  divalproex (DEPAKOTE) 500 MG DR tablet Take 1 tablet (500 mg total) by mouth 2 (two) times daily. 04/14/14   April Palumbo, MD  EPINEPHrine (EPIPEN) 0.3 mg/0.3 mL SOAJ injection Inject 0.3 mg into the muscle as needed (allergic reaction).     Historical Provider, MD  Fingolimod HCl (GILENYA) 0.5 MG CAPS Take 0.5 mg by mouth daily.     Historical Provider, MD  fluticasone (FLONASE) 50 MCG/ACT nasal spray Place 2 sprays into both nostrils daily.    Historical Provider, MD  hydrOXYzine (ATARAX/VISTARIL) 25 MG tablet Take 1  tablet (25 mg total) by mouth every 6 (six) hours. 02/05/14   Nicole Pisciotta, PA-C  lactulose (CHRONULAC) 10 GM/15ML solution Take 15 mLs (10 g total) by mouth 2 (two) times daily as needed for mild constipation or moderate constipation. Patient not taking: Reported on 03/07/2014 12/06/13   Daleen Bo, MD  loratadine (CLARITIN) 10 MG tablet Take 10 mg by mouth daily.     Historical Provider, MD  methocarbamol (ROBAXIN) 500 MG tablet Take 1 tablet (500 mg total) by mouth 2 (two) times daily. 06/08/14   Tiffany Carlota Raspberry, PA-C  omeprazole (PRILOSEC) 40 MG capsule Take 40 mg by mouth daily.     Historical Provider, MD  oxyCODONE (ROXICODONE) 15 MG immediate release tablet Take 15 mg by mouth every 8 (eight) hours as needed for pain (pain).     Historical Provider, MD  potassium chloride (K-DUR) 10 MEQ tablet Take 10 mEq by mouth 2 (two) times daily.    Historical Provider, MD  pregabalin (LYRICA) 200 MG capsule Take 600 mg by mouth at bedtime.    Historical Provider, MD  propranolol (INDERAL) 60 MG tablet Take 1 tablet (60 mg total) by mouth 2 (two) times daily. 11/23/13   Penni Bombard, MD  QUEtiapine (SEROQUEL) 100 MG tablet Take 100 mg by mouth at bedtime.    Historical Provider, MD  ranitidine (ZANTAC) 300 MG capsule Take 300 mg by mouth at bedtime.     Historical Provider, MD  topiramate (TOPAMAX) 100 MG tablet Take 100 mg by mouth 2 (two) times daily.     Historical Provider, MD  venlafaxine (EFFEXOR) 75 MG tablet Take 75 mg by mouth 3 (three) times daily with meals.    Historical Provider, MD   BP 135/106 mmHg  Pulse 101  Temp(Src) 98.8 F (37.1 C) (Oral)  Resp 16  Ht 5\' 5"  (1.651 m)  Wt 180 lb (81.647 kg)  BMI 29.95 kg/m2  SpO2 100%  LMP 11/08/2014 Physical Exam  Constitutional: She is oriented to person, place, and time. She appears well-developed and well-nourished.  HENT:  Head: Normocephalic and atraumatic.  Right Ear: External ear normal.  Left Ear: External ear normal.   Pt states it is difficult to open mouth, however has FROM and is able to open mouth nearly fully  Eyes: Conjunctivae and EOM are normal. Pupils are equal, round, and reactive to light.  Neck: Normal range of motion. Neck supple.  Cardiovascular: Normal rate, regular rhythm, normal heart sounds and intact distal pulses.   Pulmonary/Chest: Effort normal and breath sounds normal.  Abdominal: Soft. Bowel sounds are normal. There is no tenderness.  Musculoskeletal: Normal range of motion.  Neurological: She is alert and oriented to person, place, and time. She has normal strength and normal reflexes. No cranial nerve deficit or sensory deficit.  Skin: Skin is warm and dry.  Vitals reviewed.   ED Course  Procedures (including critical care time) Labs Review Labs Reviewed - No data to display  Imaging Review No results found. I have personally reviewed and evaluated these images and lab results as part of my medical decision-making.   EKG Interpretation None      MDM   Final diagnoses:  Jaw pain    34 y.o. female with pertinent PMH of conversion do, anxiety, concern for drug seeking behavior, ms presents with jaw pain and clenching.  She also complains of sensation of closing airway while in the waiting room and with L shoulder pain.  Exam as above with some ROM, however pt is able to fully extend jaw.  Ativan and Benadryl relieved all symptoms. Exam more consistent with anxiety reaction than with frank torticollis, tardive dyskinesia, other emergent pathology. Discharged home in stable condition.  I have reviewed all laboratory and imaging studies if ordered as above  1. Jaw pain         Debby Freiberg, MD 11/29/14 [phone]

## 2014-12-11 ENCOUNTER — Encounter (HOSPITAL_COMMUNITY): Payer: Self-pay

## 2014-12-11 ENCOUNTER — Emergency Department (HOSPITAL_COMMUNITY): Payer: Medicare Other

## 2014-12-11 ENCOUNTER — Emergency Department (HOSPITAL_COMMUNITY)
Admission: EM | Admit: 2014-12-11 | Discharge: 2014-12-11 | Disposition: A | Discharge status: Home / Self Care | Payer: Medicare Other | Attending: Emergency Medicine | Admitting: Emergency Medicine

## 2014-12-11 DIAGNOSIS — F410 Panic disorder [episodic paroxysmal anxiety] without agoraphobia: Secondary | ICD-10-CM | POA: Diagnosis not present

## 2014-12-11 DIAGNOSIS — S6992XA Unspecified injury of left wrist, hand and finger(s), initial encounter: Secondary | ICD-10-CM | POA: Insufficient documentation

## 2014-12-11 DIAGNOSIS — G43909 Migraine, unspecified, not intractable, without status migrainosus: Secondary | ICD-10-CM | POA: Insufficient documentation

## 2014-12-11 DIAGNOSIS — Z7951 Long term (current) use of inhaled steroids: Secondary | ICD-10-CM | POA: Insufficient documentation

## 2014-12-11 DIAGNOSIS — M25512 Pain in left shoulder: Secondary | ICD-10-CM

## 2014-12-11 DIAGNOSIS — O99351 Diseases of the nervous system complicating pregnancy, first trimester: Secondary | ICD-10-CM | POA: Insufficient documentation

## 2014-12-11 DIAGNOSIS — O99611 Diseases of the digestive system complicating pregnancy, first trimester: Secondary | ICD-10-CM | POA: Insufficient documentation

## 2014-12-11 DIAGNOSIS — F329 Major depressive disorder, single episode, unspecified: Secondary | ICD-10-CM | POA: Diagnosis not present

## 2014-12-11 DIAGNOSIS — Z791 Long term (current) use of non-steroidal anti-inflammatories (NSAID): Secondary | ICD-10-CM | POA: Diagnosis not present

## 2014-12-11 DIAGNOSIS — O99511 Diseases of the respiratory system complicating pregnancy, first trimester: Secondary | ICD-10-CM | POA: Insufficient documentation

## 2014-12-11 DIAGNOSIS — S59902A Unspecified injury of left elbow, initial encounter: Secondary | ICD-10-CM | POA: Diagnosis not present

## 2014-12-11 DIAGNOSIS — S4992XA Unspecified injury of left shoulder and upper arm, initial encounter: Secondary | ICD-10-CM | POA: Diagnosis not present

## 2014-12-11 DIAGNOSIS — W1839XA Other fall on same level, initial encounter: Secondary | ICD-10-CM | POA: Diagnosis not present

## 2014-12-11 DIAGNOSIS — G8929 Other chronic pain: Secondary | ICD-10-CM | POA: Insufficient documentation

## 2014-12-11 DIAGNOSIS — K219 Gastro-esophageal reflux disease without esophagitis: Secondary | ICD-10-CM | POA: Diagnosis not present

## 2014-12-11 DIAGNOSIS — J45909 Unspecified asthma, uncomplicated: Secondary | ICD-10-CM | POA: Insufficient documentation

## 2014-12-11 DIAGNOSIS — Y9289 Other specified places as the place of occurrence of the external cause: Secondary | ICD-10-CM | POA: Diagnosis not present

## 2014-12-11 DIAGNOSIS — Z79899 Other long term (current) drug therapy: Secondary | ICD-10-CM | POA: Insufficient documentation

## 2014-12-11 DIAGNOSIS — M25522 Pain in left elbow: Secondary | ICD-10-CM

## 2014-12-11 DIAGNOSIS — O99341 Other mental disorders complicating pregnancy, first trimester: Secondary | ICD-10-CM | POA: Diagnosis not present

## 2014-12-11 DIAGNOSIS — Y9389 Activity, other specified: Secondary | ICD-10-CM | POA: Insufficient documentation

## 2014-12-11 DIAGNOSIS — Y998 Other external cause status: Secondary | ICD-10-CM | POA: Insufficient documentation

## 2014-12-11 DIAGNOSIS — Z8742 Personal history of other diseases of the female genital tract: Secondary | ICD-10-CM | POA: Insufficient documentation

## 2014-12-11 DIAGNOSIS — M25532 Pain in left wrist: Secondary | ICD-10-CM

## 2014-12-11 DIAGNOSIS — O99331 Smoking (tobacco) complicating pregnancy, first trimester: Secondary | ICD-10-CM | POA: Diagnosis not present

## 2014-12-11 DIAGNOSIS — Z3A01 Less than 8 weeks gestation of pregnancy: Secondary | ICD-10-CM | POA: Insufficient documentation

## 2014-12-11 DIAGNOSIS — O9A211 Injury, poisoning and certain other consequences of external causes complicating pregnancy, first trimester: Secondary | ICD-10-CM | POA: Diagnosis present

## 2014-12-11 DIAGNOSIS — F17210 Nicotine dependence, cigarettes, uncomplicated: Secondary | ICD-10-CM | POA: Insufficient documentation

## 2014-12-11 DIAGNOSIS — Z3490 Encounter for supervision of normal pregnancy, unspecified, unspecified trimester: Secondary | ICD-10-CM

## 2014-12-11 LAB — POC URINE PREG, ED: PREG TEST UR: POSITIVE — AB

## 2014-12-11 MED ORDER — ACETAMINOPHEN 325 MG PO TABS
650.0000 mg | ORAL_TABLET | Freq: Once | ORAL | Status: AC
  Administered 2014-12-11: 650 mg via ORAL
  Filled 2014-12-11: qty 2

## 2014-12-11 NOTE — ED Provider Notes (Signed)
CSN: [id number]     Arrival date & time 12/11/14  1333 History   First MD Initiated Contact with Patient 12/11/14 1458     Chief Complaint  Patient presents with  . Fall  . Arm Pain  . Possible Pregnancy   HPI  Emily [name] is a 34 year old female with a past medical history of multiple sclerosis, migraines, anxiety, depression and chronic pain presenting after a fall. She states the fall happened around 3 AM when she was attempting to kneel down on the ground. She states that her hand which was bracing her on a table slipped and she fell forward and caught herself with her left arm. She is now complaining of left wrist, elbow and shoulder pain. The wrist and elbow pain are exacerbated by movement though she retains full range of motion of the joints. She states that her shoulder feels sore but this is not exacerbated by movement. She denies swelling of the joints. She denies alleviating factors including over-the-counter pain medicines. Denies weakness in the arm. She does endorse numbness in the fingertips but states this is chronic from her multiple sclerosis. Denies any other wounds or injuries sustained in the fall. She also notes that she had a positive home pregnancy test last evening. She has no other complaints at this time.  Past Medical History  Diagnosis Date  . Headache(784.0)     migraines  . Multiple sclerosis ([place])   . Asthma as a child  . GERD (gastroesophageal reflux disease)   . Urinary urgency   . Anxiety   . Perforated bowel ([place]) 2009  . Migraines   . Depression   . Chronic back pain   . Chronic pain   . Ovarian cyst   . Panic attack   . Pseudoseizures    Past Surgical History  Procedure Laterality Date  . Extremity cyst excision  1994    right leg  . 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 Colostomy closure  04/2007  . Scar revision  01/21/2011    Procedure: SCAR REVISION;  Surgeon: Hermelinda Dellen;  Location: [facility];  Service:  Plastics;  Laterality: N/A;  exploration of scar of abdomen and repair of defect  . Hernia repair    . Abdominal surgery    . Appendectomy     Family History  Problem Relation Age of Onset  . Diabetes Mother   . Hypertension Mother   . Diabetes Father   . Hypertension Father    Social History  Substance Use Topics  . Smoking status: Current Some Day Smoker -- 1.00 packs/day for 10 years    Types: Cigarettes  . Smokeless tobacco: Never Used  . Alcohol Use: No     Comment: Rare   OB History    No data available     Review of Systems  Musculoskeletal: Positive for arthralgias.  All other systems reviewed and are negative.     Allergies  Amitriptyline; Baclofen; Cymbalta; Gabapentin; Monosodium glutamate; Vicodin; Alprazolam; Magnesium salicylate; Rizatriptan; Tizanidine; Toradol; Tramadol; Adhesive; and Lamotrigine  Home Medications   Prior to Admission medications   Medication Sig Start Date End Date Taking? Authorizing Provider  celecoxib (CELEBREX) 100 MG capsule Take 1 capsule (100 mg total) by mouth 2 (two) times daily. 11/01/13  Yes Lily Kocher, PA-C  clonazePAM (KLONOPIN) 1 MG tablet Take 1 tablet by mouth 3 (three) times daily. 05/27/14  Yes Historical Provider, MD  diphenhydrAMINE ([place]) 25  MG tablet Take 50 mg by mouth at bedtime as needed for allergies or sleep.   Yes Historical Provider, MD  divalproex (DEPAKOTE) 500 MG DR tablet Take 1 tablet (500 mg total) by mouth 2 (two) times daily. 04/14/14  Yes April Palumbo, MD  EPINEPHrine (EPIPEN) 0.3 mg/0.3 mL SOAJ injection Inject 0.3 mg into the muscle as needed (allergic reaction).    Yes Historical Provider, MD  Fingolimod HCl (GILENYA) 0.5 MG CAPS Take 0.5 mg by mouth daily.    Yes Historical Provider, MD  hydrOXYzine (ATARAX/VISTARIL) 25 MG tablet Take 1 tablet (25 mg total) by mouth every 6 (six) hours. 02/05/14  Yes Nicole Pisciotta, PA-C  methocarbamol (ROBAXIN) 500 MG tablet Take 1 tablet (500 mg total) by  mouth 2 (two) times daily. 06/08/14  Yes Tiffany Carlota Raspberry, PA-C  metoCLOPramide (REGLAN) 10 MG tablet Take 10 mg by mouth 4 (four) times daily as needed for nausea.   Yes Historical Provider, MD  omeprazole (PRILOSEC) 40 MG capsule Take 40 mg by mouth daily.    Yes Historical Provider, MD  potassium chloride (K-DUR) 10 MEQ tablet Take 10 mEq by mouth 2 (two) times daily.   Yes Historical Provider, MD  pregabalin (LYRICA) 200 MG capsule Take 600 mg by mouth at bedtime.   Yes Historical Provider, MD  propranolol (INDERAL) 60 MG tablet Take 1 tablet (60 mg total) by mouth 2 (two) times daily. 11/23/13  Yes Penni Bombard, MD  QUEtiapine (SEROQUEL) 100 MG tablet Take 100 mg by mouth at bedtime.   Yes Historical Provider, MD  ranitidine (ZANTAC) 300 MG capsule Take 300 mg by mouth at bedtime.    Yes Historical Provider, MD  topiramate (TOPAMAX) 100 MG tablet Take 100 mg by mouth 2 (two) times daily.    Yes Historical Provider, MD  venlafaxine (EFFEXOR) 75 MG tablet Take 75 mg by mouth 3 (three) times daily with meals.   Yes Historical Provider, MD  verapamil (CALAN) 120 MG tablet Take 120 mg by mouth at bedtime.   Yes Historical Provider, MD  Alum & Mag Hydroxide-Simeth (MAGIC MOUTHWASH W/LIDOCAINE) SOLN Take 5 mLs by mouth 3 (three) times daily as needed (For reflux pain). Patient not taking: Reported on 03/07/2014 10/24/13   Tanna Furry, MD  chlorthalidone (HYGROTON) 25 MG tablet Take 1 tablet (25 mg total) by mouth daily. Patient not taking: Reported on 03/07/2014 [id number]   Delora Fuel, MD  FLUARIX QUADRIVALENT 0.5 ML injection 10/17/14 10/17/14   Historical Provider, MD  fluticasone (FLONASE) 50 MCG/ACT nasal spray Place 2 sprays into both nostrils daily.    Historical Provider, MD  lactulose (CHRONULAC) 10 GM/15ML solution Take 15 mLs (10 g total) by mouth 2 (two) times daily as needed for mild constipation or moderate constipation. Patient not taking: Reported on 03/07/2014 12/06/13   Daleen Bo,  MD   BP 145/95 mmHg  Pulse 96  Temp(Src) 98.4 F (36.9 C) (Oral)  Resp 16  SpO2 99%  LMP 11/08/2014 Physical Exam  Constitutional: She appears well-developed and well-nourished. No distress.  HENT:  Head: Normocephalic and atraumatic.  Eyes: Conjunctivae are normal. Right eye exhibits no discharge. Left eye exhibits no discharge. No scleral icterus.  Neck: Normal range of motion.  Cardiovascular: Normal rate, regular rhythm and intact distal pulses.   Radial pulse palpable. Cap refill less than 3 seconds.  Pulmonary/Chest: Effort normal. No respiratory distress.  Musculoskeletal: Normal range of motion.       Left shoulder: She exhibits normal range of  motion, no tenderness, no swelling and no deformity.       Left elbow: She exhibits normal range of motion, no swelling and no deformity. No tenderness found.       Left wrist: She exhibits tenderness. She exhibits normal range of motion, no swelling and no deformity.  Left wrist has generalized tenderness to palpation. She retains full active range of motion though states it is painful. No obvious swelling or deformity of the wrist. Elbow and shoulder are not tender to palpation. She retains full range of motion of the elbow and shoulder. No obvious swelling or deformities of the elbow and shoulder.  Neurological: She is alert. Coordination normal.  5 out of 5 strength of the bilateral upper extremities though with pain on testing of the left grip strength. Sensation to light touch intact throughout  Skin: Skin is warm and dry.  No wounds over the left upper extremity.  Psychiatric: She has a normal mood and affect. Her behavior is normal.  Nursing note and vitals reviewed.   ED Course  Procedures (including critical care time) Labs Review Labs Reviewed  POC URINE PREG, ED - Abnormal; Notable for the following:    Preg Test, Ur POSITIVE (*)    All other components within normal limits    Imaging Review Dg Elbow Complete  Left  12/11/2014  CLINICAL DATA:  Pain after fall this morning. EXAM: LEFT ELBOW - COMPLETE 3+ VIEW COMPARISON:  None. FINDINGS: There is no evidence of fracture, dislocation, or joint effusion. There is no evidence of arthropathy or other focal bone abnormality. Soft tissues are unremarkable. IMPRESSION: Negative. Electronically Signed   By: Franki Cabot M.D.   On: 12/11/2014 15:07   Dg Wrist Complete Left  12/11/2014  CLINICAL DATA:  Left wrist pain following a fall this morning after losing her balance. EXAM: LEFT WRIST - COMPLETE 3+ VIEW COMPARISON:  08/25/2014. FINDINGS: There is no evidence of fracture or dislocation. There is no evidence of arthropathy or other focal bone abnormality. Soft tissues are unremarkable. IMPRESSION: Normal examination. Electronically Signed   By: Claudie Revering M.D.   On: 12/11/2014 15:02   Dg Shoulder Left  12/11/2014  CLINICAL DATA:  Left shoulder pain status post fall. EXAM: LEFT SHOULDER - 2+ VIEW COMPARISON:  None. FINDINGS: There is no evidence of fracture or dislocation. There is no evidence of arthropathy or other focal bone abnormality. Left clavicular deformity may represent a prior healed traumatic injury. Soft tissues are unremarkable. IMPRESSION: Negative. Electronically Signed   By: Fidela Salisbury M.D.   On: 12/11/2014 15:04   I have personally reviewed and evaluated these images and lab results as part of my medical decision-making.   EKG Interpretation None      MDM   Final diagnoses:  Left wrist pain  Elbow pain, left  Left shoulder pain  Pregnancy   Patient presenting with left wrist, elbow and shoulder pain after a fall. Left arm is neurovascularly intact with FROM. Patient X-Ray negative for obvious fracture or dislocation. Pain managed in ED with tylenol. Brace given and conservative therapy recommended. Discussed RICE therapy, ice, heat and use of OTC pain relievers. Pt advised to follow up with PCP at [facility] if symptoms  persist. Pt also with positive pregnancy test in ED. Advised to go to [facility] walk in clinic in the morning to establish prenatal care and to discuss her current medications. Pt also reports not having a current neurologist so she has been given  a referral to [facility]. Return precautions discussed at bedside and given in discharge paperwork. Pt is stable for discharge.     Josephina Gip, PA-C 12/11/14 1636  Harvel Quale, MD 12/12/14 [phone]

## 2014-12-11 NOTE — ED Notes (Signed)
Pt c/o L shoulder, elbow, and wrist pain after a fall this morning and would like a pregnancy test.  Pain score 9/10.  Pt reports that she was leaning forward to grab something, lost balance, and fell.  Limited ROM noted.  Pt reports taking OTC pain medications w/o relief.  Hx of MS.  Pt reports she had a positive home pregnancy test, would like confirmation, and would like education about what MS medication changes she needs to make.

## 2014-12-11 NOTE — ED Notes (Signed)
Pt to XR

## 2014-12-11 NOTE — Discharge Instructions (Signed)
Go to the [facility] walk in clinic tomorrow for medication management and to schedule prenatal care appointments. Schedule a follow up appointment with [facility].    Joint Pain Joint pain, which is also called arthralgia, can be caused by many things. Joint pain often goes away when you follow your health care provider's instructions for relieving pain at home. However, joint pain can also be caused by conditions that require further treatment. Common causes of joint pain include:  Bruising in the area of the joint.  Overuse of the joint.  Wear and tear on the joints that occur with aging (osteoarthritis).  Various other forms of arthritis.  A buildup of a crystal form of uric acid in the joint (gout).  Infections of the joint (septic arthritis) or of the bone (osteomyelitis). Your health care provider may recommend medicine to help with the pain. If your joint pain continues, additional tests may be needed to diagnose your condition. HOME CARE INSTRUCTIONS Watch your condition for any changes. Follow these instructions as directed to lessen the pain that you are feeling.  Take medicines only as directed by your health care provider.  Rest the affected area for as long as your health care provider says that you should. If directed to do so, raise the painful joint above the level of your heart while you are sitting or lying down.  Do not do things that cause or worsen pain.  If directed, apply ice to the painful area:  Put ice in a plastic bag.  Place a towel between your skin and the bag.  Leave the ice on for 20 minutes, 2-3 times per day.  Wear an elastic bandage, splint, or sling as directed by your health care provider. Loosen the elastic bandage or splint if your fingers or toes become numb and tingle, or if they turn cold and blue.  Begin exercising or stretching the affected area as directed by your health care provider. Ask your health care provider what  types of exercise are safe for you.  Keep all follow-up visits as directed by your health care provider. This is important. SEEK MEDICAL CARE IF:  Your pain increases, and medicine does not help.  Your joint pain does not improve within 3 days.  You have increased bruising or swelling.  You have a fever.  You lose 10 lb (4.5 kg) or more without trying. SEEK IMMEDIATE MEDICAL CARE IF:  You are not able to move the joint.  Your fingers or toes become numb or they turn cold and blue.   This information is not intended to replace advice given to you by your health care provider. Make sure you discuss any questions you have with your health care provider.   Document Released: 12/24/2004 Document Revised: 01/14/2014 Document Reviewed: 10/05/2013 Elsevier Interactive Patient Education 2016 [place] of Pregnancy The first trimester of pregnancy is from week 1 until the end of week 12 (months 1 through 3). A week after a sperm fertilizes an egg, the egg will implant on the wall of the uterus. This embryo will begin to develop into a baby. Genes from you and your partner are forming the baby. The female genes determine whether the baby is a boy or a girl. At 6-8 weeks, the eyes and face are formed, and the heartbeat can be seen on ultrasound. At the end of 12 weeks, all the baby's organs are formed.  Now that you are pregnant, you will want to  do everything you can to have a healthy baby. Two of the most important things are to get good prenatal care and to follow your health care provider's instructions. Prenatal care is all the medical care you receive before the baby's birth. This care will help prevent, find, and treat any problems during the pregnancy and childbirth. BODY CHANGES Your body goes through many changes during pregnancy. The changes vary from woman to woman.   You may gain or lose a couple of pounds at first.  You may feel sick to your stomach (nauseous)  and throw up (vomit). If the vomiting is uncontrollable, call your health care provider.  You may tire easily.  You may develop headaches that can be relieved by medicines approved by your health care provider.  You may urinate more often. Painful urination may mean you have a bladder infection.  You may develop heartburn as a result of your pregnancy.  You may develop constipation because certain hormones are causing the muscles that push waste through your intestines to slow down.  You may develop hemorrhoids or swollen, bulging veins (varicose veins).  Your breasts may begin to grow larger and become tender. Your nipples may stick out more, and the tissue that surrounds them ([place]ola) may become darker.  Your gums may bleed and may be sensitive to brushing and flossing.  Dark spots or blotches (chloasma, mask of pregnancy) may develop on your face. This will likely fade after the baby is born.  Your menstrual periods will stop.  You may have a loss of appetite.  You may develop cravings for certain kinds of food.  You may have changes in your emotions from day to day, such as being excited to be pregnant or being concerned that something may go wrong with the pregnancy and baby.  You may have more vivid and strange dreams.  You may have changes in your hair. These can include thickening of your hair, rapid growth, and changes in texture. Some women also have hair loss during or after pregnancy, or hair that feels dry or thin. Your hair will most likely return to normal after your baby is born. WHAT TO EXPECT AT YOUR PRENATAL VISITS During a routine prenatal visit:  You will be weighed to make sure you and the baby are growing normally.  Your blood pressure will be taken.  Your abdomen will be measured to track your baby's growth.  The fetal heartbeat will be listened to starting around week 10 or 12 of your pregnancy.  Test results from any previous visits will be  discussed. Your health care provider may ask you:  How you are feeling.  If you are feeling the baby move.  If you have had any abnormal symptoms, such as leaking fluid, bleeding, severe headaches, or abdominal cramping.  If you are using any tobacco products, including cigarettes, chewing tobacco, and electronic cigarettes.  If you have any questions. Other tests that may be performed during your first trimester include:  Blood tests to find your blood type and to check for the presence of any previous infections. They will also be used to check for low iron levels (anemia) and Rh antibodies. Later in the pregnancy, blood tests for diabetes will be done along with other tests if problems develop.  Urine tests to check for infections, diabetes, or protein in the urine.  An ultrasound to confirm the proper growth and development of the baby.  An amniocentesis to check for possible genetic problems.  Fetal screens for spina bifida and Down syndrome.  You may need other tests to make sure you and the baby are doing well.  HIV (human immunodeficiency virus) testing. Routine prenatal testing includes screening for HIV, unless you choose not to have this test. HOME CARE INSTRUCTIONS  Medicines  Follow your health care provider's instructions regarding medicine use. Specific medicines may be either safe or unsafe to take during pregnancy.  Take your prenatal vitamins as directed.  If you develop constipation, try taking a stool softener if your health care provider approves. Diet  Eat regular, well-balanced meals. Choose a variety of foods, such as meat or vegetable-based protein, fish, milk and low-fat dairy products, vegetables, fruits, and whole grain breads and cereals. Your health care provider will help you determine the amount of weight gain that is right for you.  Avoid raw meat and uncooked cheese. These carry germs that can cause birth defects in the baby.  Eating four or  five small meals rather than three large meals a day may help relieve nausea and vomiting. If you start to feel nauseous, eating a few soda crackers can be helpful. Drinking liquids between meals instead of during meals also seems to help nausea and vomiting.  If you develop constipation, eat more high-fiber foods, such as fresh vegetables or fruit and whole grains. Drink enough fluids to keep your urine clear or pale yellow. Activity and Exercise  Exercise only as directed by your health care provider. Exercising will help you:  Control your weight.  Stay in shape.  Be prepared for labor and delivery.  Experiencing pain or cramping in the lower abdomen or low back is a good sign that you should stop exercising. Check with your health care provider before continuing normal exercises.  Try to avoid standing for long periods of time. Move your legs often if you must stand in one place for a long time.  Avoid heavy lifting.  Wear low-heeled shoes, and practice good posture.  You may continue to have sex unless your health care provider directs you otherwise. Relief of Pain or Discomfort  Wear a good support bra for breast tenderness.   Take warm sitz baths to soothe any pain or discomfort caused by hemorrhoids. Use hemorrhoid cream if your health care provider approves.   Rest with your legs elevated if you have leg cramps or low back pain.  If you develop varicose veins in your legs, wear support hose. Elevate your feet for 15 minutes, 3-4 times a day. Limit salt in your diet. Prenatal Care  Schedule your prenatal visits by the twelfth week of pregnancy. They are usually scheduled monthly at first, then more often in the last 2 months before delivery.  Write down your questions. Take them to your prenatal visits.  Keep all your prenatal visits as directed by your health care provider. Safety  Wear your seat belt at all times when driving.  Make a list of emergency phone  numbers, including numbers for family, friends, the hospital, and police and fire departments. General Tips  Ask your health care provider for a referral to a local prenatal education class. Begin classes no later than at the beginning of month 6 of your pregnancy.  Ask for help if you have counseling or nutritional needs during pregnancy. Your health care provider can offer advice or refer you to specialists for help with various needs.  Do not use hot tubs, steam rooms, or saunas.  Do not douche or use  tampons or scented sanitary pads.  Do not cross your legs for long periods of time.  Avoid cat litter boxes and soil used by cats. These carry germs that can cause birth defects in the baby and possibly loss of the fetus by miscarriage or stillbirth.  Avoid all smoking, herbs, alcohol, and medicines not prescribed by your health care provider. Chemicals in these affect the formation and growth of the baby.  Do not use any tobacco products, including cigarettes, chewing tobacco, and electronic cigarettes. If you need help quitting, ask your health care provider. You may receive counseling support and other resources to help you quit.  Schedule a dentist appointment. At home, brush your teeth with a soft toothbrush and be gentle when you floss. SEEK MEDICAL CARE IF:   You have dizziness.  You have mild pelvic cramps, pelvic pressure, or nagging pain in the abdominal area.  You have persistent nausea, vomiting, or diarrhea.  You have a bad smelling vaginal discharge.  You have pain with urination.  You notice increased swelling in your face, hands, legs, or ankles. SEEK IMMEDIATE MEDICAL CARE IF:   You have a fever.  You are leaking fluid from your vagina.  You have spotting or bleeding from your vagina.  You have severe abdominal cramping or pain.  You have rapid weight gain or loss.  You vomit blood or material that looks like coffee grounds.  You are exposed to [place]  measles and have never had them.  You are exposed to fifth disease or chickenpox.  You develop a severe headache.  You have shortness of breath.  You have any kind of trauma, such as from a fall or a car accident.   This information is not intended to replace advice given to you by your health care provider. Make sure you discuss any questions you have with your health care provider.   Document Released: 12/18/2000 Document Revised: 01/14/2014 Document Reviewed: 11/03/2012 Elsevier Interactive Patient Education [employer].

## 2014-12-21 ENCOUNTER — Inpatient Hospital Stay (HOSPITAL_COMMUNITY)
Admission: AD | Admit: 2014-12-21 | Discharge: 2014-12-21 | Disposition: A | Discharge status: Home / Self Care | Payer: Medicare Other | Source: Ambulatory Visit | Attending: Obstetrics & Gynecology | Admitting: Obstetrics & Gynecology

## 2014-12-21 ENCOUNTER — Encounter (HOSPITAL_COMMUNITY): Payer: Self-pay | Admitting: *Deleted

## 2014-12-21 ENCOUNTER — Inpatient Hospital Stay (HOSPITAL_COMMUNITY): Payer: Medicare Other

## 2014-12-21 DIAGNOSIS — O99331 Smoking (tobacco) complicating pregnancy, first trimester: Secondary | ICD-10-CM | POA: Diagnosis not present

## 2014-12-21 DIAGNOSIS — Z3A01 Less than 8 weeks gestation of pregnancy: Secondary | ICD-10-CM | POA: Diagnosis not present

## 2014-12-21 DIAGNOSIS — R109 Unspecified abdominal pain: Secondary | ICD-10-CM

## 2014-12-21 DIAGNOSIS — F329 Major depressive disorder, single episode, unspecified: Secondary | ICD-10-CM

## 2014-12-21 DIAGNOSIS — F418 Other specified anxiety disorders: Secondary | ICD-10-CM | POA: Diagnosis not present

## 2014-12-21 DIAGNOSIS — F32A Depression, unspecified: Secondary | ICD-10-CM

## 2014-12-21 DIAGNOSIS — O26891 Other specified pregnancy related conditions, first trimester: Secondary | ICD-10-CM | POA: Diagnosis not present

## 2014-12-21 DIAGNOSIS — G35 Multiple sclerosis: Secondary | ICD-10-CM | POA: Diagnosis not present

## 2014-12-21 DIAGNOSIS — R1032 Left lower quadrant pain: Secondary | ICD-10-CM | POA: Diagnosis present

## 2014-12-21 DIAGNOSIS — Z3491 Encounter for supervision of normal pregnancy, unspecified, first trimester: Secondary | ICD-10-CM

## 2014-12-21 DIAGNOSIS — O99351 Diseases of the nervous system complicating pregnancy, first trimester: Secondary | ICD-10-CM

## 2014-12-21 DIAGNOSIS — F17210 Nicotine dependence, cigarettes, uncomplicated: Secondary | ICD-10-CM | POA: Diagnosis not present

## 2014-12-21 DIAGNOSIS — O99341 Other mental disorders complicating pregnancy, first trimester: Secondary | ICD-10-CM

## 2014-12-21 DIAGNOSIS — F419 Anxiety disorder, unspecified: Secondary | ICD-10-CM

## 2014-12-21 DIAGNOSIS — Z8669 Personal history of other diseases of the nervous system and sense organs: Secondary | ICD-10-CM

## 2014-12-21 DIAGNOSIS — O26899 Other specified pregnancy related conditions, unspecified trimester: Secondary | ICD-10-CM

## 2014-12-21 LAB — CBC
HCT: 43.2 % (ref 36.0–46.0)
HEMOGLOBIN: 14.6 g/dL (ref 12.0–15.0)
MCH: 29.9 pg (ref 26.0–34.0)
MCHC: 33.8 g/dL (ref 30.0–36.0)
MCV: 88.3 fL (ref 78.0–100.0)
PLATELETS: 327 10*3/uL (ref 150–400)
RBC: 4.89 MIL/uL (ref 3.87–5.11)
RDW: 14.1 % (ref 11.5–15.5)
WBC: 7.9 10*3/uL (ref 4.0–10.5)

## 2014-12-21 LAB — ABO/RH: ABO/RH(D): O POS

## 2014-12-21 LAB — HCG, QUANTITATIVE, PREGNANCY: HCG, BETA CHAIN, QUANT, S: 25526 m[IU]/mL — AB (ref <5)

## 2014-12-21 MED ORDER — HYDROXYZINE HCL 25 MG PO TABS: 25.0000 mg | ORAL_TABLET | Freq: Three times a day (TID) | ORAL | Status: DC | PRN

## 2014-12-21 MED ORDER — FOLIC ACID 1 MG PO TABS: 3.0000 mg | ORAL_TABLET | Freq: Every day | ORAL | Status: DC

## 2014-12-21 MED ORDER — FLUOXETINE HCL 20 MG PO TABS: 20.0000 mg | ORAL_TABLET | Freq: Every day | ORAL | Status: DC

## 2014-12-21 MED ORDER — SUMATRIPTAN SUCCINATE 100 MG PO TABS: 100.0000 mg | ORAL_TABLET | ORAL | Status: DC | PRN

## 2014-12-21 MED ORDER — PROMETHAZINE HCL 25 MG PO TABS: 12.5000 mg | ORAL_TABLET | Freq: Four times a day (QID) | ORAL | Status: DC | PRN

## 2014-12-21 MED ORDER — PRENATAL VITAMINS 28-0.8 MG PO TABS: 1.0000 | ORAL_TABLET | Freq: Every day | ORAL | Status: DC

## 2014-12-21 MED ORDER — CYCLOBENZAPRINE HCL 10 MG PO TABS: 10.0000 mg | ORAL_TABLET | Freq: Three times a day (TID) | ORAL | Status: DC | PRN

## 2014-12-21 NOTE — Discharge Instructions (Signed)
First Trimester of Pregnancy The first trimester of pregnancy is from week 1 until the end of week 12 (months 1 through 3). A week after a sperm fertilizes an egg, the egg will implant on the wall of the uterus. This embryo will begin to develop into a baby. Genes from you and your partner are forming the baby. The female genes determine whether the baby is a boy or a girl. At 6-8 weeks, the eyes and face are formed, and the heartbeat can be seen on ultrasound. At the end of 12 weeks, all the baby's organs are formed.  Now that you are pregnant, you will want to do everything you can to have a healthy baby. Two of the most important things are to get good prenatal care and to follow your health care provider's instructions. Prenatal care is all the medical care you receive before the baby's birth. This care will help prevent, find, and treat any problems during the pregnancy and childbirth. BODY CHANGES Your body goes through many changes during pregnancy. The changes vary from woman to woman.   You may gain or lose a couple of pounds at first.  You may feel sick to your stomach (nauseous) and throw up (vomit). If the vomiting is uncontrollable, call your health care provider.  You may tire easily.  You may develop headaches that can be relieved by medicines approved by your health care provider.  You may urinate more often. Painful urination may mean you have a bladder infection.  You may develop heartburn as a result of your pregnancy.  You may develop constipation because certain hormones are causing the muscles that push waste through your intestines to slow down.  You may develop hemorrhoids or swollen, bulging veins (varicose veins).  Your breasts may begin to grow larger and become tender. Your nipples may stick out more, and the tissue that surrounds them (areola) may become darker.  Your gums may bleed and may be sensitive to brushing and flossing.  Dark spots or blotches (chloasma,  mask of pregnancy) may develop on your face. This will likely fade after the baby is born.  Your menstrual periods will stop.  You may have a loss of appetite.  You may develop cravings for certain kinds of food.  You may have changes in your emotions from day to day, such as being excited to be pregnant or being concerned that something may go wrong with the pregnancy and baby.  You may have more vivid and strange dreams.  You may have changes in your hair. These can include thickening of your hair, rapid growth, and changes in texture. Some women also have hair loss during or after pregnancy, or hair that feels dry or thin. Your hair will most likely return to normal after your baby is born. WHAT TO EXPECT AT YOUR PRENATAL VISITS During a routine prenatal visit:  You will be weighed to make sure you and the baby are growing normally.  Your blood pressure will be taken.  Your abdomen will be measured to track your baby's growth.  The fetal heartbeat will be listened to starting around week 10 or 12 of your pregnancy.  Test results from any previous visits will be discussed. Your health care provider may ask you:  How you are feeling.  If you are feeling the baby move.  If you have had any abnormal symptoms, such as leaking fluid, bleeding, severe headaches, or abdominal cramping.  If you are using any tobacco products,   including cigarettes, chewing tobacco, and electronic cigarettes.  If you have any questions. Other tests that may be performed during your first trimester include:  Blood tests to find your blood type and to check for the presence of any previous infections. They will also be used to check for low iron levels (anemia) and Rh antibodies. Later in the pregnancy, blood tests for diabetes will be done along with other tests if problems develop.  Urine tests to check for infections, diabetes, or protein in the urine.  An ultrasound to confirm the proper growth  and development of the baby.  An amniocentesis to check for possible genetic problems.  Fetal screens for spina bifida and Down syndrome.  You may need other tests to make sure you and the baby are doing well.  HIV (human immunodeficiency virus) testing. Routine prenatal testing includes screening for HIV, unless you choose not to have this test. HOME CARE INSTRUCTIONS  Medicines  Follow your health care provider's instructions regarding medicine use. Specific medicines may be either safe or unsafe to take during pregnancy.  Take your prenatal vitamins as directed.  If you develop constipation, try taking a stool softener if your health care provider approves. Diet  Eat regular, well-balanced meals. Choose a variety of foods, such as meat or vegetable-based protein, fish, milk and low-fat dairy products, vegetables, fruits, and whole grain breads and cereals. Your health care provider will help you determine the amount of weight gain that is right for you.  Avoid raw meat and uncooked cheese. These carry germs that can cause birth defects in the baby.  Eating four or five small meals rather than three large meals a day may help relieve nausea and vomiting. If you start to feel nauseous, eating a few soda crackers can be helpful. Drinking liquids between meals instead of during meals also seems to help nausea and vomiting.  If you develop constipation, eat more high-fiber foods, such as fresh vegetables or fruit and whole grains. Drink enough fluids to keep your urine clear or pale yellow. Activity and Exercise  Exercise only as directed by your health care provider. Exercising will help you:  Control your weight.  Stay in shape.  Be prepared for labor and delivery.  Experiencing pain or cramping in the lower abdomen or low back is a good sign that you should stop exercising. Check with your health care provider before continuing normal exercises.  Try to avoid standing for long  periods of time. Move your legs often if you must stand in one place for a long time.  Avoid heavy lifting.  Wear low-heeled shoes, and practice good posture.  You may continue to have sex unless your health care provider directs you otherwise. Relief of Pain or Discomfort  Wear a good support bra for breast tenderness.   Take warm sitz baths to soothe any pain or discomfort caused by hemorrhoids. Use hemorrhoid cream if your health care provider approves.   Rest with your legs elevated if you have leg cramps or low back pain.  If you develop varicose veins in your legs, wear support hose. Elevate your feet for 15 minutes, 3-4 times a day. Limit salt in your diet. Prenatal Care  Schedule your prenatal visits by the twelfth week of pregnancy. They are usually scheduled monthly at first, then more often in the last 2 months before delivery.  Write down your questions. Take them to your prenatal visits.  Keep all your prenatal visits as directed by your   health care provider. Safety  Wear your seat belt at all times when driving.  Make a list of emergency phone numbers, including numbers for family, friends, the hospital, and police and fire departments. General Tips  Ask your health care provider for a referral to a local prenatal education class. Begin classes no later than at the beginning of month 6 of your pregnancy.  Ask for help if you have counseling or nutritional needs during pregnancy. Your health care provider can offer advice or refer you to specialists for help with various needs.  Do not use hot tubs, steam rooms, or saunas.  Do not douche or use tampons or scented sanitary pads.  Do not cross your legs for long periods of time.  Avoid cat litter boxes and soil used by cats. These carry germs that can cause birth defects in the baby and possibly loss of the fetus by miscarriage or stillbirth.  Avoid all smoking, herbs, alcohol, and medicines not prescribed by  your health care provider. Chemicals in these affect the formation and growth of the baby.  Do not use any tobacco products, including cigarettes, chewing tobacco, and electronic cigarettes. If you need help quitting, ask your health care provider. You may receive counseling support and other resources to help you quit.  Schedule a dentist appointment. At home, brush your teeth with a soft toothbrush and be gentle when you floss. SEEK MEDICAL CARE IF:   You have dizziness.  You have mild pelvic cramps, pelvic pressure, or nagging pain in the abdominal area.  You have persistent nausea, vomiting, or diarrhea.  You have a bad smelling vaginal discharge.  You have pain with urination.  You notice increased swelling in your face, hands, legs, or ankles. SEEK IMMEDIATE MEDICAL CARE IF:   You have a fever.  You are leaking fluid from your vagina.  You have spotting or bleeding from your vagina.  You have severe abdominal cramping or pain.  You have rapid weight gain or loss.  You vomit blood or material that looks like coffee grounds.  You are exposed to German measles and have never had them.  You are exposed to fifth disease or chickenpox.  You develop a severe headache.  You have shortness of breath.  You have any kind of trauma, such as from a fall or a car accident.   This information is not intended to replace advice given to you by your health care provider. Make sure you discuss any questions you have with your health care provider.   Document Released: 12/18/2000 Document Revised: 01/14/2014 Document Reviewed: 11/03/2012 Elsevier Interactive Patient Education 2016 [employer]  

## 2014-12-21 NOTE — MAU Provider Note (Signed)
Chief Complaint: Abdominal Pain   First Provider Initiated Contact with Patient 12/21/14 1547      SUBJECTIVE HPI: Emily [name] is a 34 y.o. G1P0 at [redacted]w[redacted]d by LMP who presents to maternity admissions reporting left lower quadrant pain that is intermittent and sharp, starting 2-3 days ago.  She has medical hx significant for MS, migraines, depression/anxiety, and seizures and is worried about her medications in pregnancy. She was told to stop taking all medications except Tylenol by her primary care provider.  She was recently discharged from her neurologist in [place] because she missed 2 appointments and was late for a third. She has contacted them with news of the pregnancy but they will not reinstate her care.  Her primary care office, [facility] is working on referral to new neurology office.  She has initial prenatal appointment in [facility] on 02/08/14. She denies vaginal bleeding, vaginal itching/burning, urinary symptoms, h/a, dizziness, n/v, or fever/chills.     Abdominal Pain This is a new problem. The current episode started in the past 7 days. The onset quality is sudden. The problem occurs intermittently. The problem has been resolved. The pain is located in the LLQ. The pain is moderate. The quality of the pain is sharp. The abdominal pain does not radiate. Associated symptoms include nausea. Pertinent negatives include no constipation, diarrhea, dysuria, fever, frequency, headaches or vomiting. The pain is aggravated by certain positions and movement. The pain is relieved by nothing. She has tried nothing for the symptoms.    Past Medical History  Diagnosis Date  . Headache(784.0)     migraines  . Multiple sclerosis ([place])   . Asthma as a child  . GERD (gastroesophageal reflux disease)   . Urinary urgency   . Anxiety   . Perforated bowel ([place]) 2009  . Migraines   . Depression   . Chronic back pain   . Chronic pain   . Ovarian cyst   . Panic attack   . Pseudoseizures     Past Surgical History  Procedure Laterality Date  . Extremity cyst excision  1994    right leg  . 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 Colostomy closure  04/2007  . Scar revision  01/21/2011    Procedure: SCAR REVISION;  Surgeon: Hermelinda Dellen;  Location: [place];  Service: Plastics;  Laterality: N/A;  exploration of scar of abdomen and repair of defect  . Hernia repair    . Abdominal surgery    . Appendectomy     Social History   Social History  . Marital Status: Single    Spouse Name: N/A  . Number of Children: 0  . Years of Education: College   Occupational History  .  Other    disability   Social History Main Topics  . Smoking status: Current Every Day Smoker -- 1.00 packs/day for 10 years    Types: Cigarettes  . Smokeless tobacco: Never Used  . Alcohol Use: No     Comment: Rare  . Drug Use: No  . Sexual Activity: Yes    Birth Control/ Protection: None   Other Topics Concern  . Not on file   Social History Narrative   Patient lives at home with fiance.   Caffeine Use: 1 20oz soda daily   Current Facility-Administered Medications on File Prior to Encounter  Medication Dose Route Frequency Provider Last Rate Last Dose  . dexamethasone (DECADRON) injection 10 mg  10 mg  Intravenous Once Milton Ferguson, MD      . diphenhydrAMINE (BENADRYL) injection 50 mg  50 mg Intravenous Once Milton Ferguson, MD      . HYDROmorphone (DILAUDID) injection 1 mg  1 mg Intravenous Once Milton Ferguson, MD      . metoCLOPramide (REGLAN) injection 10 mg  10 mg Intravenous Once Milton Ferguson, MD       Current Outpatient Prescriptions on File Prior to Encounter  Medication Sig Dispense Refill  . clonazePAM (KLONOPIN) 1 MG tablet Take 1 tablet by mouth 3 (three) times daily.  5  . diphenhydrAMINE (SOMINEX) 25 MG tablet Take 50 mg by mouth at bedtime as needed for allergies or sleep.    Marland Kitchen EPINEPHrine (EPIPEN) 0.3 mg/0.3 mL SOAJ injection Inject 0.3 mg into the  muscle as needed (allergic reaction).     . Fingolimod HCl (GILENYA) 0.5 MG CAPS Take 0.5 mg by mouth daily.     . fluticasone (FLONASE) 50 MCG/ACT nasal spray Place 2 sprays into both nostrils daily.    . metoCLOPramide (REGLAN) 10 MG tablet Take 10 mg by mouth 4 (four) times daily as needed for nausea.    Marland Kitchen omeprazole (PRILOSEC) 40 MG capsule Take 40 mg by mouth daily.     . potassium chloride (K-DUR) 10 MEQ tablet Take 10 mEq by mouth 2 (two) times daily.    . QUEtiapine (SEROQUEL) 100 MG tablet Take 100 mg by mouth at bedtime.    . ranitidine (ZANTAC) 300 MG capsule Take 300 mg by mouth at bedtime.     . topiramate (TOPAMAX) 100 MG tablet Take 100 mg by mouth 2 (two) times daily.      Allergies  Allergen Reactions  . Amitriptyline Hypertension and Other (See Comments)    hypertension  . Baclofen Hives and Shortness Of Breath  . Cymbalta [Duloxetine Hcl] Shortness Of Breath and Rash  . Gabapentin Shortness Of Breath and Rash  . Monosodium Glutamate Anaphylaxis  . Vicodin [Hydrocodone-Acetaminophen] Hives and Nausea And Vomiting    Projectile vomiting  . Alprazolam Other (See Comments)    Lethargy  . Magnesium Salicylate Hives and Itching  . Rizatriptan Nausea And Vomiting and Other (See Comments)    GI upset, Projectile vomiting  . Tizanidine Hives  . Toradol [Ketorolac Tromethamine] Nausea Only  . Tramadol Other (See Comments)  . Adhesive [Tape] Other (See Comments)    Skin irritation  . Lamotrigine Rash    ROS:  Review of Systems  Constitutional: Negative for fever, chills and fatigue.  HENT: Negative for sinus pressure.   Eyes: Negative for photophobia.  Respiratory: Negative for shortness of breath.   Cardiovascular: Negative for chest pain.  Gastrointestinal: Positive for nausea and abdominal pain. Negative for vomiting, diarrhea and constipation.  Genitourinary: Negative for dysuria, frequency, flank pain, vaginal bleeding, vaginal discharge, difficulty urinating,  vaginal pain and pelvic pain.  Musculoskeletal: Negative for neck pain.  Neurological: Negative for dizziness, weakness and headaches.  Psychiatric/Behavioral: Negative.      I have reviewed patient's Past Medical Hx, Surgical Hx, Family Hx, Social Hx, medications and allergies.   Physical Exam   Patient Vitals for the past 24 hrs:  BP Temp Temp src Pulse Resp  12/21/14 1716 122/77 mmHg - - 90 -  12/21/14 1419 128/92 mmHg 98 F (36.7 C) Oral 84 18   Constitutional: Well-developed, well-nourished female in no acute distress.  Cardiovascular: normal rate Respiratory: normal effort GI: Abd soft, non-tender. Pos BS x 4 MS: Extremities nontender,  no edema, normal ROM Neurologic: Alert and oriented x 4.  GU: Neg CVAT.  PELVIC EXAM: Deferred  LAB RESULTS Results for orders placed or performed during the hospital encounter of 12/21/14 (from the past 24 hour(s))  CBC     Status: None   Collection Time: 12/21/14  2:05 PM  Result Value Ref Range   WBC 7.9 4.0 - 10.5 K/uL   RBC 4.89 3.87 - 5.11 MIL/uL   Hemoglobin 14.6 12.0 - 15.0 g/dL   HCT 43.2 36.0 - 46.0 %   MCV 88.3 78.0 - 100.0 fL   MCH 29.9 26.0 - 34.0 pg   MCHC 33.8 30.0 - 36.0 g/dL   RDW 14.1 11.5 - 15.5 %   Platelets 327 150 - 400 K/uL  ABO/Rh     Status: None   Collection Time: 12/21/14  2:05 PM  Result Value Ref Range   ABO/RH(D) O POS   hCG, quantitative, pregnancy     Status: Abnormal   Collection Time: 12/21/14  2:05 PM  Result Value Ref Range   hCG, Beta Chain, Quant, S 25526 (H) <5 mIU/mL    --/--/O POS (12/14 1405)  IMAGING US Ob Comp Less 14 Wks  12/21/2014  CLINICAL DATA:  Abdominal pain.  Pregnancy. EXAM: OBSTETRIC <14 WK [place] AND TRANSVAGINAL OB US TECHNIQUE: Both transabdominal and transvaginal ultrasound examinations were performed for complete evaluation of the gestation as well as the maternal uterus, adnexal regions, and pelvic cul-de-sac. Transvaginal technique was performed to assess early  pregnancy. COMPARISON:  CT 06/25/2014. FINDINGS: Intrauterine gestational sac: Visualized/normal in shape. Yolk sac:  Present. Embryo:  Present. Cardiac Activity: Present. Heart Rate: 105  bpm CRL:  2.2  mm   5 w   5 d                  [place] EDC: 08/18/2015 Maternal uterus/adnexae: No evidence of subchorionic hemorrhage. Probable small left corpus luteal cyst. IMPRESSION: Single viable intra Uterine pregnancy at 5 weeks 5 days. Fetal heart rate 105 beats per minute. Electronically Signed   By: Rawlins   On: 12/21/2014 15:31   US Ob Transvaginal  12/21/2014  CLINICAL DATA:  Abdominal pain.  Pregnancy. EXAM: OBSTETRIC <14 WK [place] AND TRANSVAGINAL OB US TECHNIQUE: Both transabdominal and transvaginal ultrasound examinations were performed for complete evaluation of the gestation as well as the maternal uterus, adnexal regions, and pelvic cul-de-sac. Transvaginal technique was performed to assess early pregnancy. COMPARISON:  CT 06/25/2014. FINDINGS: Intrauterine gestational sac: Visualized/normal in shape. Yolk sac:  Present. Embryo:  Present. Cardiac Activity: Present. Heart Rate: 105  bpm CRL:  2.2  mm   5 w   5 d                  [place] EDC: 08/18/2015 Maternal uterus/adnexae: No evidence of subchorionic hemorrhage. Probable small left corpus luteal cyst. IMPRESSION: Single viable intra Uterine pregnancy at 5 weeks 5 days. Fetal heart rate 105 beats per minute. Electronically Signed   By: Marcello Moores  Register   On: 12/21/2014 15:31     MAU Management/MDM: Ordered labs and imaging and reviewed results.  Consult [facility] to discuss discharge medications/follow up plan.  Reviewed in detail medications not recommended in pregnancy.  Reviewed benefits vs risks of medications for MS, seizures, anxiety/depression.  Discussed benefits vs risks with pt and list of medications for pt to stay on/start today agreed upon with pt. See list below. Pt to follow up with new neurologist referred by  her primary care provider, and  follow up in [facility] as scheduled at 12 weeks.  Pt stable at time of discharge.  Pt to stop taking celecoxib, chlorthilidone, divaloproex, methocarbamol, pregabalin, propranalol, venlafaxine, and verapamil.  She is to take clonazapam only as needed and sparingly if possible, continue quetiapine Q HS as needed, and continue Topamax as prescribed since she is allergic to Lamictal.  Pt aware of potential risks of these medications but benefits may outweigh risks due to her medical conditions.   Discussed immune modulator medication, Fingolimod, with pt and pt to decide if risks of Category C medication outweigh benefits and discuss the medication with new neurologist once established.  Also Ok to continue are Vistaril, Reglan, Prilosec, Flonase, Benadryl, and Potassium.    Add new Rx for Flexeril, Prozac, Phenergan, Imitrex, Prenatal Vitamin, and Folic acid 3g (for total of 4 g with prenatal vitamin per ACOG with anti-seizure management).    ASSESSMENT 1. Normal IUP (intrauterine pregnancy) on prenatal ultrasound, first trimester   2. Abdominal pain in pregnancy   3. Hx of migraines   4. Multiple sclerosis affecting pregnancy in first trimester ([place])   5. Depression affecting pregnancy in first trimester, antepartum   6. Anxiety during pregnancy in first trimester, antepartum     PLAN Discharge home Pt to follow up with new neurology practice once referred by primary care Keep scheduled appt on 2/2 in [facility] Return to MAU as needed for gyn/OB emergencies, to [facility] or [facility] for medical emergencies     Medication List    STOP taking these medications        celecoxib 100 MG capsule  Commonly known as:  CELEBREX     chlorthalidone 25 MG tablet  Commonly known as:  HYGROTON     divalproex 500 MG DR tablet  Commonly known as:  DEPAKOTE     FLUARIX QUADRIVALENT 0.5 ML injection  Generic drug:  Influenza vac split quadrivalent PF     lactulose 10 GM/15ML solution  Commonly known as:   CHRONULAC     magic mouthwash w/lidocaine Soln     methocarbamol 500 MG tablet  Commonly known as:  ROBAXIN     pregabalin 200 MG capsule  Commonly known as:  LYRICA     propranolol 60 MG tablet  Commonly known as:  INDERAL     venlafaxine 75 MG tablet  Commonly known as:  EFFEXOR     verapamil 120 MG tablet  Commonly known as:  CALAN      TAKE these medications        clonazePAM 1 MG tablet  Commonly known as:  KLONOPIN  Take 1 tablet by mouth 3 (three) times daily.     cyclobenzaprine 10 MG tablet  Commonly known as:  FLEXERIL  Take 1 tablet (10 mg total) by mouth 3 (three) times daily as needed for muscle spasms.     diphenhydrAMINE 25 MG tablet  Commonly known as:  SOMINEX  Take 50 mg by mouth at bedtime as needed for allergies or sleep.     EPIPEN 0.3 mg/0.3 mL Soaj injection  Generic drug:  EPINEPHrine  Inject 0.3 mg into the muscle as needed (allergic reaction).     FLUoxetine 20 MG tablet  Commonly known as:  PROZAC  Take 1 tablet (20 mg total) by mouth daily.     fluticasone 50 MCG/ACT nasal spray  Commonly known as:  FLONASE  Place 2 sprays into both nostrils daily.  folic acid 1 MG tablet  Commonly known as:  FOLVITE  Take 3 tablets (3 mg total) by mouth daily.     GILENYA 0.5 MG Caps  Generic drug:  Fingolimod HCl  Take 0.5 mg by mouth daily.     hydrOXYzine 25 MG tablet  Commonly known as:  ATARAX/VISTARIL  Take 1 tablet (25 mg total) by mouth 3 (three) times daily as needed for anxiety.     metoCLOPramide 10 MG tablet  Commonly known as:  REGLAN  Take 10 mg by mouth 4 (four) times daily as needed for nausea.     omeprazole 40 MG capsule  Commonly known as:  PRILOSEC  Take 40 mg by mouth daily.     potassium chloride 10 MEQ tablet  Commonly known as:  K-DUR  Take 10 mEq by mouth 2 (two) times daily.     Prenatal Vitamins 28-0.8 MG Tabs  Take 1 tablet by mouth daily.     promethazine 25 MG tablet  Commonly known as:  PHENERGAN   Take 0.5-1 tablets (12.5-25 mg total) by mouth every 6 (six) hours as needed for nausea.     QUEtiapine 100 MG tablet  Commonly known as:  SEROQUEL  Take 100 mg by mouth at bedtime.     ranitidine 300 MG capsule  Commonly known as:  ZANTAC  Take 300 mg by mouth at bedtime.     SUMAtriptan 100 MG tablet  Commonly known as:  IMITREX  Take 1 tablet (100 mg total) by mouth every 2 (two) hours as needed for migraine. May repeat in 2 hours if headache persists or recurs.     topiramate 100 MG tablet  Commonly known as:  TOPAMAX  Take 100 mg by mouth 2 (two) times daily.       Follow-up Information    Follow up with [facility].   Specialty:  Obstetrics and Gynecology   Why:  As scheduled, Return to MAU as needed for emergencies   Contact information:   [place] [phone]      Please follow up.   Why:  With your primary care provider for referral to neurology as planned      Fatima Blank Certified Nurse-Midwife 12/21/2014  6:56 PM

## 2014-12-21 NOTE — MAU Note (Signed)
Pt has sharp pain in LLQ, also lower back pain - started yesterday.  Denies bleeding.

## 2014-12-22 LAB — HIV ANTIBODY (ROUTINE TESTING W REFLEX): HIV Screen 4th Generation wRfx: NONREACTIVE

## 2014-12-27 ENCOUNTER — Encounter: Payer: Self-pay | Admitting: *Deleted

## 2014-12-27 DIAGNOSIS — G43909 Migraine, unspecified, not intractable, without status migrainosus: Secondary | ICD-10-CM | POA: Insufficient documentation

## 2014-12-27 DIAGNOSIS — F32A Depression, unspecified: Secondary | ICD-10-CM | POA: Insufficient documentation

## 2014-12-27 DIAGNOSIS — G40909 Epilepsy, unspecified, not intractable, without status epilepticus: Secondary | ICD-10-CM | POA: Insufficient documentation

## 2014-12-27 DIAGNOSIS — F329 Major depressive disorder, single episode, unspecified: Secondary | ICD-10-CM | POA: Insufficient documentation

## 2014-12-27 DIAGNOSIS — G35 Multiple sclerosis: Secondary | ICD-10-CM | POA: Insufficient documentation

## 2014-12-27 DIAGNOSIS — E669 Obesity, unspecified: Secondary | ICD-10-CM

## 2014-12-27 DIAGNOSIS — O0990 Supervision of high risk pregnancy, unspecified, unspecified trimester: Secondary | ICD-10-CM | POA: Insufficient documentation

## 2014-12-27 DIAGNOSIS — F17200 Nicotine dependence, unspecified, uncomplicated: Secondary | ICD-10-CM | POA: Insufficient documentation

## 2014-12-27 DIAGNOSIS — F419 Anxiety disorder, unspecified: Secondary | ICD-10-CM | POA: Insufficient documentation

## 2014-12-29 ENCOUNTER — Telehealth: Payer: Self-pay

## 2014-12-29 DIAGNOSIS — F419 Anxiety disorder, unspecified: Secondary | ICD-10-CM

## 2014-12-29 NOTE — Telephone Encounter (Signed)
Pt left message on voicemail concerning prescription for atarax

## 2014-12-29 NOTE — Telephone Encounter (Signed)
Left message for patient to call us back according to note Danelle Berry only gave patient 9 tablets. Pt is requesting more.

## 2015-01-03 MED ORDER — HYDROXYZINE HCL 25 MG PO TABS: 25.0000 mg | ORAL_TABLET | Freq: Three times a day (TID) | ORAL | Status: DC | PRN

## 2015-01-03 NOTE — Addendum Note (Signed)
Addended by: Shelly Coss on: 01/03/2015 04:34 PM   Modules accepted: Orders

## 2015-01-03 NOTE — Telephone Encounter (Signed)
Per Lattie Haw may refill patient's vistaril. Called patient, no answer- left message stating we are trying to reach you to let you know about a refill sent to your pharmacy. If you have any questions you may call us back at the clinics

## 2015-02-09 ENCOUNTER — Encounter: Payer: Self-pay | Admitting: Obstetrics & Gynecology

## 2015-02-15 ENCOUNTER — Inpatient Hospital Stay (HOSPITAL_COMMUNITY)
Admission: AD | Admit: 2015-02-15 | Discharge: 2015-02-15 | Disposition: A | Discharge status: Home / Self Care | Payer: Medicare Other | Source: Ambulatory Visit | Attending: Obstetrics & Gynecology | Admitting: Obstetrics & Gynecology

## 2015-02-15 ENCOUNTER — Encounter (HOSPITAL_COMMUNITY): Payer: Self-pay | Admitting: *Deleted

## 2015-02-15 DIAGNOSIS — J069 Acute upper respiratory infection, unspecified: Secondary | ICD-10-CM | POA: Diagnosis not present

## 2015-02-15 DIAGNOSIS — G35 Multiple sclerosis: Secondary | ICD-10-CM | POA: Insufficient documentation

## 2015-02-15 DIAGNOSIS — F329 Major depressive disorder, single episode, unspecified: Secondary | ICD-10-CM | POA: Insufficient documentation

## 2015-02-15 DIAGNOSIS — R509 Fever, unspecified: Secondary | ICD-10-CM | POA: Diagnosis present

## 2015-02-15 DIAGNOSIS — J45909 Unspecified asthma, uncomplicated: Secondary | ICD-10-CM | POA: Insufficient documentation

## 2015-02-15 DIAGNOSIS — O26891 Other specified pregnancy related conditions, first trimester: Secondary | ICD-10-CM | POA: Diagnosis present

## 2015-02-15 DIAGNOSIS — Z3A14 14 weeks gestation of pregnancy: Secondary | ICD-10-CM | POA: Insufficient documentation

## 2015-02-15 DIAGNOSIS — O99332 Smoking (tobacco) complicating pregnancy, second trimester: Secondary | ICD-10-CM | POA: Diagnosis not present

## 2015-02-15 DIAGNOSIS — O131 Gestational [pregnancy-induced] hypertension without significant proteinuria, first trimester: Secondary | ICD-10-CM | POA: Diagnosis not present

## 2015-02-15 DIAGNOSIS — K219 Gastro-esophageal reflux disease without esophagitis: Secondary | ICD-10-CM | POA: Diagnosis not present

## 2015-02-15 DIAGNOSIS — F4310 Post-traumatic stress disorder, unspecified: Secondary | ICD-10-CM | POA: Insufficient documentation

## 2015-02-15 DIAGNOSIS — R030 Elevated blood-pressure reading, without diagnosis of hypertension: Secondary | ICD-10-CM | POA: Insufficient documentation

## 2015-02-15 DIAGNOSIS — O161 Unspecified maternal hypertension, first trimester: Secondary | ICD-10-CM

## 2015-02-15 DIAGNOSIS — R05 Cough: Secondary | ICD-10-CM | POA: Diagnosis present

## 2015-02-15 HISTORY — DX: Dissociative and conversion disorder, unspecified: F44.9

## 2015-02-15 HISTORY — DX: Other specified abnormal immunological findings in serum: R76.89

## 2015-02-15 HISTORY — DX: Post-traumatic stress disorder, unspecified: F43.10

## 2015-02-15 HISTORY — DX: Other specified abnormal immunological findings in serum: R76.8

## 2015-02-15 LAB — COMPREHENSIVE METABOLIC PANEL
ALBUMIN: 3.8 g/dL (ref 3.5–5.0)
ALT: 11 U/L — ABNORMAL LOW (ref 14–54)
AST: 24 U/L (ref 15–41)
Alkaline Phosphatase: 73 U/L (ref 38–126)
Anion gap: 15 (ref 5–15)
BUN: 11 mg/dL (ref 6–20)
CHLORIDE: 100 mmol/L — AB (ref 101–111)
CO2: 21 mmol/L — AB (ref 22–32)
Calcium: 9.2 mg/dL (ref 8.9–10.3)
Creatinine, Ser: 0.51 mg/dL (ref 0.44–1.00)
GFR calc Af Amer: >60 mL/min (ref >60)
GFR calc non Af Amer: >60 mL/min (ref >60)
GLUCOSE: 87 mg/dL (ref 65–99)
POTASSIUM: 3.7 mmol/L (ref 3.5–5.1)
SODIUM: 136 mmol/L (ref 135–145)
Total Bilirubin: 0.4 mg/dL (ref 0.3–1.2)
Total Protein: 7.4 g/dL (ref 6.5–8.1)

## 2015-02-15 LAB — URINALYSIS, ROUTINE W REFLEX MICROSCOPIC
BILIRUBIN URINE: NEGATIVE
GLUCOSE, UA: NEGATIVE mg/dL
KETONES UR: NEGATIVE mg/dL
LEUKOCYTES UA: NEGATIVE
Nitrite: NEGATIVE
PH: 5 (ref 5.0–8.0)
Protein, ur: NEGATIVE mg/dL
Specific Gravity, Urine: >1.03 — ABNORMAL HIGH (ref 1.005–1.030)

## 2015-02-15 LAB — URINE MICROSCOPIC-ADD ON

## 2015-02-15 LAB — CBC WITH DIFFERENTIAL/PLATELET
BASOS ABS: 0 10*3/uL (ref 0.0–0.1)
BASOS PCT: 0 %
EOS ABS: 0.1 10*3/uL (ref 0.0–0.7)
EOS PCT: 1 %
HEMATOCRIT: 39.9 % (ref 36.0–46.0)
Hemoglobin: 14.1 g/dL (ref 12.0–15.0)
Lymphocytes Relative: 20 %
Lymphs Abs: 3.2 10*3/uL (ref 0.7–4.0)
MCH: 30.8 pg (ref 26.0–34.0)
MCHC: 35.3 g/dL (ref 30.0–36.0)
MCV: 87.1 fL (ref 78.0–100.0)
MONO ABS: 0.6 10*3/uL (ref 0.1–1.0)
Monocytes Relative: 4 %
NEUTROS ABS: 12.1 10*3/uL — AB (ref 1.7–7.7)
Neutrophils Relative %: 75 %
PLATELETS: 429 10*3/uL — AB (ref 150–400)
RBC: 4.58 MIL/uL (ref 3.87–5.11)
RDW: 13.9 % (ref 11.5–15.5)
WBC: 16 10*3/uL — ABNORMAL HIGH (ref 4.0–10.5)

## 2015-02-15 LAB — PROTEIN / CREATININE RATIO, URINE
Creatinine, Urine: 270 mg/dL
PROTEIN CREATININE RATIO: 0.11 mg/mg{creat} (ref 0.00–0.15)
Total Protein, Urine: 29 mg/dL

## 2015-02-15 LAB — INFLUENZA PANEL BY PCR (TYPE A & B)
H1N1FLUPCR: NOT DETECTED
Influenza A By PCR: NEGATIVE
Influenza B By PCR: NEGATIVE

## 2015-02-15 LAB — URIC ACID: URIC ACID, SERUM: 3.5 mg/dL (ref 2.3–6.6)

## 2015-02-15 LAB — LACTATE DEHYDROGENASE: LDH: 189 U/L (ref 98–192)

## 2015-02-15 MED ORDER — DEXTROSE 5 % IN LACTATED RINGERS IV BOLUS: 1000.0000 mL | Freq: Once | INTRAVENOUS | Status: DC

## 2015-02-15 MED ORDER — ALBUTEROL SULFATE HFA 108 (90 BASE) MCG/ACT IN AERS: 2.0000 | INHALATION_SPRAY | Freq: Four times a day (QID) | RESPIRATORY_TRACT | Status: AC | PRN

## 2015-02-15 MED ORDER — ALBUTEROL SULFATE (2.5 MG/3ML) 0.083% IN NEBU
2.5000 mg | INHALATION_SOLUTION | RESPIRATORY_TRACT | Status: AC
  Administered 2015-02-15: 2.5 mg via RESPIRATORY_TRACT
  Filled 2015-02-15: qty 3

## 2015-02-15 MED ORDER — LABETALOL HCL 200 MG PO TABS: 200.0000 mg | ORAL_TABLET | Freq: Two times a day (BID) | ORAL | Status: DC

## 2015-02-15 NOTE — Discharge Instructions (Signed)

## 2015-02-15 NOTE — MAU Note (Signed)
Sick for a couple days, been coughing, congestion. Has had a fever off and on, reports fever of 102.  Productive cough, started off greeenish white, now just white.

## 2015-02-15 NOTE — MAU Note (Addendum)
C/o cold, congestion and cough since Sat night; missed her 1st prenatal visit today; several health issues (please is medical history);

## 2015-02-17 LAB — CULTURE, OB URINE

## 2015-02-17 NOTE — MAU Provider Note (Signed)
History     CSN: [id number]  Arrival date and time: 02/15/15 [id number]   First Provider Initiated Contact with Patient 02/15/15 1728      Chief Complaint  Patient presents with  . Cough  . Nasal Congestion  . Fever   HPI Emily [name] 35 y.o. G1P0 @[redacted]w[redacted]d  presents to MAU complaining of cough, fever, general feeling of sickness.  It started 5 days ago.  It started with sore throat and fatigue and has progressed to have difficulty breathing and cough.  Yesterday her cough was productive of yellow/green sputum.  Although she is coughing today, it is less productive.  She had a fever of 102 2 days ago and yesterday but no fever today.  She has decreased appetite and intermittent nausea.  She denies abdominal pain, vaginal bleeding, weakness, trouble going to bathroom, back pain.  She has extensive medical history including MS, migraine, seizure disorder, JC virus.  She is under the care of a PCP.    OB History    Gravida Para Term Preterm AB TAB SAB Ectopic Multiple Living   1               Past Medical History  Diagnosis Date  . Headache(784.0)     migraines  . Multiple sclerosis ([place])   . Asthma as a child  . GERD (gastroesophageal reflux disease)   . Urinary urgency   . Anxiety   . Perforated bowel ([place]) 2009  . Migraines   . Depression   . Chronic back pain   . Chronic pain   . Ovarian cyst   . Panic attack   . Pseudoseizures   . Conversion disorder   . JC virus antibody positive   . PTSD (post-traumatic stress disorder)     Past Surgical History  Procedure Laterality Date  . Extremity cyst excision  1994    right leg  . 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 Colostomy closure  04/2007  . Scar revision  01/21/2011    Procedure: SCAR REVISION;  Surgeon: Hermelinda Dellen;  Location: [facility];  Service: Plastics;  Laterality: N/A;  exploration of scar of abdomen and repair of defect  . Hernia repair    . Abdominal surgery    . Appendectomy       Family History  Problem Relation Age of Onset  . Diabetes Mother   . Hypertension Mother   . Diabetes Father   . Hypertension Father   . Arthritis Father     Social History  Substance Use Topics  . Smoking status: Current Every Day Smoker -- 1.00 packs/day for 10 years    Types: Cigarettes  . Smokeless tobacco: Never Used  . Alcohol Use: No     Comment: Rare    Allergies:  Allergies  Allergen Reactions  . Amitriptyline Hypertension and Other (See Comments)    hypertension  . Baclofen Hives and Shortness Of Breath  . Cymbalta [Duloxetine Hcl] Shortness Of Breath and Rash  . Gabapentin Shortness Of Breath and Rash  . Monosodium Glutamate Anaphylaxis  . Vicodin [Hydrocodone-Acetaminophen] Hives and Nausea And Vomiting    Projectile vomiting  . Alprazolam Other (See Comments)    Lethargy  . Magnesium Salicylate Hives and Itching  . Rizatriptan Nausea And Vomiting and Other (See Comments)    GI upset, Projectile vomiting  . Tizanidine Hives  . Toradol [Ketorolac Tromethamine] Nausea Only  . Tramadol Other (See Comments)  . Adhesive [  Tape] Other (See Comments)    Skin irritation  . Lamotrigine Rash    No prescriptions prior to admission    ROS Pertinent ROS in HPI.  All other systems are negative.   Physical Exam   Blood pressure 153/94, pulse 96, temperature 98.7 F (37.1 C), temperature source Oral, resp. rate 22, weight 219 lb 9.6 oz (99.61 kg), last menstrual period 11/08/2014, SpO2 99 %.  Physical Exam  Constitutional: She is oriented to person, place, and time. She appears well-developed and well-nourished. No distress.  HENT:  Head: Normocephalic and atraumatic.  Eyes: Conjunctivae and EOM are normal.  Neck: Normal range of motion. Neck supple.  Cardiovascular: Normal rate, regular rhythm and normal heart sounds.   Respiratory: Effort normal. She has wheezes.  GI: Soft. She exhibits no distension. There is no tenderness.  Musculoskeletal: Normal  range of motion.  Neurological: She is alert and oriented to person, place, and time.  Skin: Skin is warm and dry.  Psychiatric: She has a normal mood and affect. Her behavior is normal.    MAU Course  Procedures  MDM Fetal well-being assured with good heart tones on triage.  Nebulized albuterol ordered to help breathing IV fluids ordered in anticipation of dehydration as pt reports recent fever.  Baseline PIH labs ordered Pt feeling improved after fluids and breathing treatment U/A shows no significant dehydration Blood work discussed with Dr. Glo Herring.  No need to treat with abx at this time.  MD agreeable to discharge home with rx for albuterol and initiation of labetalol 200mg  bid.  Pt to begin [facility] asap.    Assessment and Plan  A:  1. Elevated blood pressure affecting pregnancy in first trimester, antepartum   2. Acute upper respiratory infection    P: Discharge to home Albuterol inhaler Labetalol 200mg  bid x 2 weeks [facility] asap (has appt 2/20).  Don't be late.  Don't miss appt Patient may return to MAU as needed or if her condition were to change or worsen   Paticia Stack 02/17/2015, 9:29 AM

## 2015-02-27 ENCOUNTER — Ambulatory Visit (INDEPENDENT_AMBULATORY_CARE_PROVIDER_SITE_OTHER): Payer: Medicare Other | Admitting: Obstetrics and Gynecology

## 2015-02-27 ENCOUNTER — Encounter: Payer: Self-pay | Admitting: Obstetrics and Gynecology

## 2015-02-27 ENCOUNTER — Other Ambulatory Visit (HOSPITAL_COMMUNITY)
Admission: RE | Admit: 2015-02-27 | Discharge: 2015-02-27 | Disposition: A | Discharge status: Home / Self Care | Payer: Medicare Other | Source: Ambulatory Visit | Attending: Obstetrics and Gynecology | Admitting: Obstetrics and Gynecology

## 2015-02-27 VITALS — BP 139/89 | HR 91 | Temp 98.2°F | Wt 220.0 lb

## 2015-02-27 DIAGNOSIS — O0992 Supervision of high risk pregnancy, unspecified, second trimester: Secondary | ICD-10-CM | POA: Diagnosis not present

## 2015-02-27 DIAGNOSIS — F419 Anxiety disorder, unspecified: Secondary | ICD-10-CM | POA: Diagnosis not present

## 2015-02-27 DIAGNOSIS — R319 Hematuria, unspecified: Secondary | ICD-10-CM | POA: Diagnosis not present

## 2015-02-27 DIAGNOSIS — I10 Essential (primary) hypertension: Secondary | ICD-10-CM

## 2015-02-27 DIAGNOSIS — O09519 Supervision of elderly primigravida, unspecified trimester: Secondary | ICD-10-CM | POA: Insufficient documentation

## 2015-02-27 DIAGNOSIS — O10912 Unspecified pre-existing hypertension complicating pregnancy, second trimester: Secondary | ICD-10-CM

## 2015-02-27 DIAGNOSIS — O99330 Smoking (tobacco) complicating pregnancy, unspecified trimester: Secondary | ICD-10-CM | POA: Insufficient documentation

## 2015-02-27 DIAGNOSIS — K631 Perforation of intestine (nontraumatic): Secondary | ICD-10-CM

## 2015-02-27 DIAGNOSIS — G40909 Epilepsy, unspecified, not intractable, without status epilepticus: Secondary | ICD-10-CM | POA: Diagnosis not present

## 2015-02-27 DIAGNOSIS — Z113 Encounter for screening for infections with a predominantly sexual mode of transmission: Secondary | ICD-10-CM | POA: Insufficient documentation

## 2015-02-27 DIAGNOSIS — E669 Obesity, unspecified: Secondary | ICD-10-CM | POA: Diagnosis not present

## 2015-02-27 DIAGNOSIS — O09512 Supervision of elderly primigravida, second trimester: Secondary | ICD-10-CM | POA: Diagnosis not present

## 2015-02-27 DIAGNOSIS — O99212 Obesity complicating pregnancy, second trimester: Secondary | ICD-10-CM | POA: Diagnosis not present

## 2015-02-27 DIAGNOSIS — G8929 Other chronic pain: Secondary | ICD-10-CM | POA: Diagnosis not present

## 2015-02-27 DIAGNOSIS — O99332 Smoking (tobacco) complicating pregnancy, second trimester: Secondary | ICD-10-CM

## 2015-02-27 DIAGNOSIS — O10919 Unspecified pre-existing hypertension complicating pregnancy, unspecified trimester: Secondary | ICD-10-CM | POA: Insufficient documentation

## 2015-02-27 DIAGNOSIS — O99210 Obesity complicating pregnancy, unspecified trimester: Secondary | ICD-10-CM | POA: Insufficient documentation

## 2015-02-27 HISTORY — DX: Essential (primary) hypertension: I10

## 2015-02-27 LAB — POCT URINALYSIS DIP (DEVICE)
Bilirubin Urine: NEGATIVE
GLUCOSE, UA: NEGATIVE mg/dL
Ketones, ur: NEGATIVE mg/dL
NITRITE: NEGATIVE
PROTEIN: NEGATIVE mg/dL
UROBILINOGEN UA: 0.2 mg/dL (ref 0.0–1.0)
pH: 5.5 (ref 5.0–8.0)

## 2015-02-27 MED ORDER — FOLIC ACID 1 MG PO TABS
3.0000 mg | ORAL_TABLET | Freq: Every day | ORAL | Status: DC
Start: 2015-02-27 — End: 2015-07-15

## 2015-02-27 MED ORDER — FLUOXETINE HCL 20 MG PO TABS: 20.0000 mg | ORAL_TABLET | Freq: Every day | ORAL | Status: DC

## 2015-02-27 MED ORDER — HYDROXYZINE HCL 25 MG PO TABS: 25.0000 mg | ORAL_TABLET | Freq: Three times a day (TID) | ORAL | Status: DC | PRN

## 2015-02-27 MED ORDER — CYCLOBENZAPRINE HCL 10 MG PO TABS: 10.0000 mg | ORAL_TABLET | Freq: Three times a day (TID) | ORAL | Status: DC | PRN

## 2015-02-27 MED ORDER — SUMATRIPTAN SUCCINATE 100 MG PO TABS: 100.0000 mg | ORAL_TABLET | ORAL | Status: DC | PRN

## 2015-02-27 MED ORDER — PROMETHAZINE HCL 25 MG PO TABS: 12.5000 mg | ORAL_TABLET | Freq: Four times a day (QID) | ORAL | Status: DC | PRN

## 2015-02-27 NOTE — Patient Instructions (Signed)
Second Trimester of Pregnancy The second trimester is from week 13 through week 28, months 4 through 6. The second trimester is often a time when you feel your best. Your body has also adjusted to being pregnant, and you begin to feel better physically. Usually, morning sickness has lessened or quit completely, you may have more energy, and you may have an increase in appetite. The second trimester is also a time when the fetus is growing rapidly. At the end of the sixth month, the fetus is about 9 inches long and weighs about 1 pounds. You will likely begin to feel the baby move (quickening) between 18 and 20 weeks of the pregnancy. BODY CHANGES Your body goes through many changes during pregnancy. The changes vary from woman to woman.   Your weight will continue to increase. You will notice your lower abdomen bulging out.  You may begin to get stretch marks on your hips, abdomen, and breasts.  You may develop headaches that can be relieved by medicines approved by your health care provider.  You may urinate more often because the fetus is pressing on your bladder.  You may develop or continue to have heartburn as a result of your pregnancy.  You may develop constipation because certain hormones are causing the muscles that push waste through your intestines to slow down.  You may develop hemorrhoids or swollen, bulging veins (varicose veins).  You may have back pain because of the weight gain and pregnancy hormones relaxing your joints between the bones in your pelvis and as a result of a shift in weight and the muscles that support your balance.  Your breasts will continue to grow and be tender.  Your gums may bleed and may be sensitive to brushing and flossing.  Dark spots or blotches (chloasma, mask of pregnancy) may develop on your face. This will likely fade after the baby is born.  A dark line from your belly button to the pubic area (linea nigra) may appear. This will likely  fade after the baby is born.  You may have changes in your hair. These can include thickening of your hair, rapid growth, and changes in texture. Some women also have hair loss during or after pregnancy, or hair that feels dry or thin. Your hair will most likely return to normal after your baby is born. WHAT TO EXPECT AT YOUR PRENATAL VISITS During a routine prenatal visit:  You will be weighed to make sure you and the fetus are growing normally.  Your blood pressure will be taken.  Your abdomen will be measured to track your baby's growth.  The fetal heartbeat will be listened to.  Any test results from the previous visit will be discussed. Your health care provider may ask you:  How you are feeling.  If you are feeling the baby move.  If you have had any abnormal symptoms, such as leaking fluid, bleeding, severe headaches, or abdominal cramping.  If you are using any tobacco products, including cigarettes, chewing tobacco, and electronic cigarettes.  If you have any questions. Other tests that may be performed during your second trimester include:  Blood tests that check for:  Low iron levels (anemia).  Gestational diabetes (between 24 and 28 weeks).  Rh antibodies.  Urine tests to check for infections, diabetes, or protein in the urine.  An ultrasound to confirm the proper growth and development of the baby.  An amniocentesis to check for possible genetic problems.  Fetal screens for spina bifida   and Down syndrome.  HIV (human immunodeficiency virus) testing. Routine prenatal testing includes screening for HIV, unless you choose not to have this test. HOME CARE INSTRUCTIONS   Avoid all smoking, herbs, alcohol, and unprescribed drugs. These chemicals affect the formation and growth of the baby.  Do not use any tobacco products, including cigarettes, chewing tobacco, and electronic cigarettes. If you need help quitting, ask your health care provider. You may receive  counseling support and other resources to help you quit.  Follow your health care provider's instructions regarding medicine use. There are medicines that are either safe or unsafe to take during pregnancy.  Exercise only as directed by your health care provider. Experiencing uterine cramps is a good sign to stop exercising.  Continue to eat regular, healthy meals.  Wear a good support bra for breast tenderness.  Do not use hot tubs, steam rooms, or saunas.  Wear your seat belt at all times when driving.  Avoid raw meat, uncooked cheese, cat litter boxes, and soil used by cats. These carry germs that can cause birth defects in the baby.  Take your prenatal vitamins.  Take 1500-2000 mg of calcium daily starting at the 20th week of pregnancy until you deliver your baby.  Try taking a stool softener (if your health care provider approves) if you develop constipation. Eat more high-fiber foods, such as fresh vegetables or fruit and whole grains. Drink plenty of fluids to keep your urine clear or pale yellow.  Take warm sitz baths to soothe any pain or discomfort caused by hemorrhoids. Use hemorrhoid cream if your health care provider approves.  If you develop varicose veins, wear support hose. Elevate your feet for 15 minutes, 3-4 times a day. Limit salt in your diet.  Avoid heavy lifting, wear low heel shoes, and practice good posture.  Rest with your legs elevated if you have leg cramps or low back pain.  Visit your dentist if you have not gone yet during your pregnancy. Use a soft toothbrush to brush your teeth and be gentle when you floss.  A sexual relationship may be continued unless your health care provider directs you otherwise.  Continue to go to all your prenatal visits as directed by your health care provider. SEEK MEDICAL CARE IF:   You have dizziness.  You have mild pelvic cramps, pelvic pressure, or nagging pain in the abdominal area.  You have persistent nausea,  vomiting, or diarrhea.  You have a bad smelling vaginal discharge.  You have pain with urination. SEEK IMMEDIATE MEDICAL CARE IF:   You have a fever.  You are leaking fluid from your vagina.  You have spotting or bleeding from your vagina.  You have severe abdominal cramping or pain.  You have rapid weight gain or loss.  You have shortness of breath with chest pain.  You notice sudden or extreme swelling of your face, hands, ankles, feet, or legs.  You have not felt your baby move in over an hour.  You have severe headaches that do not go away with medicine.  You have vision changes.   This information is not intended to replace advice given to you by your health care provider. Make sure you discuss any questions you have with your health care provider.   Document Released: 12/18/2000 Document Revised: 01/14/2014 Document Reviewed: 02/25/2012 Elsevier Interactive Patient Education 2016 [employer].  Pregnancy and Smoking Smoking during pregnancy is unhealthy for you and your developing baby. The addictive drug nicotine, carbon monoxide, and  many other poisons are inhaled from a cigarette and carried through your bloodstream to your baby. Cigarette smoke contains more than 2,500 chemicals. It is not known which of these are harmful to a developing baby. However, both nicotine and carbon monoxide play a role in causing health problems in pregnancy. Smoking during pregnancy increases the risk of:  Birth defects in your baby, including heart defects.  Miscarriage and stillbirth.  Birth before 29 completed weeks of pregnancy (premature birth).  Pregnancy outside of the uterus (tubal pregnancy).  Attachment of the placenta over the opening of the uterus (placenta previa).  Detachment of the placenta before the baby's birth (placental abruption).  Breaking of the bag of waters before labor begins (premature rupture of membranes). HOW DOES SMOKING DURING PREGNANCY AFFECT MY  BABY? Before Birth Smoking during pregnancy:  Decreases blood flow and oxygen to your baby.  Increases the heart rate of your baby.  Slows your baby's growth in the uterus (intrauterine growth retardation). After Birth Babies born to women who smoke during pregnancy are more likely to have a low birth weight. They are also at risk for:  Serious health problems, chronic or lifelong disabilities (cerebral palsy, mental retardation, learning problems), and death.  Sudden infant death syndrome (SIDS).  Lung and breathing problems. WHAT RESOURCES ARE AVAILABLE TO HELP ME STOP SMOKING?  Ask your health care provider for help to stop smoking. The following resources are available:  Counseling.  Psychological treatment.  Acupuncture.  Family intervention.  Hypnosis.  Nicotine supplements have not been studied enough to know if they are safe to use during pregnancy. They should only be considered when all other methods fail, and if used under the close supervision of your health care provider.  Telephone QUIT lines. The national smoking cessation telephone hotline number is 1-800-QUIT NOW. FOR MORE INFORMATION  American Cancer Society: www.cancer.org  American Heart Association: www.heart.[place]: www.cancer.gov  March of Dimes: www.marchofdimes.org   This information is not intended to replace advice given to you by your health care provider. Make sure you discuss any questions you have with your health care provider.   Document Released: 05/07/2004 Document Revised: 12/29/2012 Document Reviewed: 11/23/2012 Elsevier Interactive Patient Education [employer].  Contraception Choices Contraception (birth control) is the use of any methods or devices to prevent pregnancy. Below are some methods to help avoid pregnancy. HORMONAL METHODS   Contraceptive implant. This is a thin, plastic tube containing progesterone hormone. It does not contain  estrogen hormone. Your health care provider inserts the tube in the inner part of the upper arm. The tube can remain in place for up to 3 years. After 3 years, the implant must be removed. The implant prevents the ovaries from releasing an egg (ovulation), thickens the cervical mucus to prevent sperm from entering the uterus, and thins the lining of the inside of the uterus.  Progesterone-only injections. These injections are given every 3 months by your health care provider to prevent pregnancy. This synthetic progesterone hormone stops the ovaries from releasing eggs. It also thickens cervical mucus and changes the uterine lining. This makes it harder for sperm to survive in the uterus.  Birth control pills. These pills contain estrogen and progesterone hormone. They work by preventing the ovaries from releasing eggs (ovulation). They also cause the cervical mucus to thicken, preventing the sperm from entering the uterus. Birth control pills are prescribed by a health care provider.Birth control pills can also be used to treat heavy  periods.  Minipill. This type of birth control pill contains only the progesterone hormone. They are taken every day of each month and must be prescribed by your health care provider.  Birth control patch. The patch contains hormones similar to those in birth control pills. It must be changed once a week and is prescribed by a health care provider.  Vaginal ring. The ring contains hormones similar to those in birth control pills. It is left in the vagina for 3 weeks, removed for 1 week, and then a new one is put back in place. The patient must be comfortable inserting and removing the ring from the vagina.A health care provider's prescription is necessary.  Emergency contraception. Emergency contraceptives prevent pregnancy after unprotected sexual intercourse. This pill can be taken right after sex or up to 5 days after unprotected sex. It is most effective the sooner  you take the pills after having sexual intercourse. Most emergency contraceptive pills are available without a prescription. Check with your pharmacist. Do not use emergency contraception as your only form of birth control. BARRIER METHODS   Female condom. This is a thin sheath (latex or rubber) that is worn over the penis during sexual intercourse. It can be used with spermicide to increase effectiveness.  Female condom. This is a soft, loose-fitting sheath that is put into the vagina before sexual intercourse.  Diaphragm. This is a soft, latex, dome-shaped barrier that must be fitted by a health care provider. It is inserted into the vagina, along with a spermicidal jelly. It is inserted before intercourse. The diaphragm should be left in the vagina for 6 to 8 hours after intercourse.  Cervical cap. This is a round, soft, latex or plastic cup that fits over the cervix and must be fitted by a health care provider. The cap can be left in place for up to 48 hours after intercourse.  Sponge. This is a soft, circular piece of polyurethane foam. The sponge has spermicide in it. It is inserted into the vagina after wetting it and before sexual intercourse.  Spermicides. These are chemicals that kill or block sperm from entering the cervix and uterus. They come in the form of creams, jellies, suppositories, foam, or tablets. They do not require a prescription. They are inserted into the vagina with an applicator before having sexual intercourse. The process must be repeated every time you have sexual intercourse. INTRAUTERINE CONTRACEPTION  Intrauterine device (IUD). This is a T-shaped device that is put in a woman's uterus during a menstrual period to prevent pregnancy. There are 2 types:  Copper IUD. This type of IUD is wrapped in copper wire and is placed inside the uterus. Copper makes the uterus and fallopian tubes produce a fluid that kills sperm. It can stay in place for 10 years.  Hormone IUD.  This type of IUD contains the hormone progestin (synthetic progesterone). The hormone thickens the cervical mucus and prevents sperm from entering the uterus, and it also thins the uterine lining to prevent implantation of a fertilized egg. The hormone can weaken or kill the sperm that get into the uterus. It can stay in place for 3-5 years, depending on which type of IUD is used. PERMANENT METHODS OF CONTRACEPTION  Female tubal ligation. This is when the woman's fallopian tubes are surgically sealed, tied, or blocked to prevent the egg from traveling to the uterus.  Hysteroscopic sterilization. This involves placing a small coil or insert into each fallopian tube. Your doctor uses a technique called  hysteroscopy to do the procedure. The device causes scar tissue to form. This results in permanent blockage of the fallopian tubes, so the sperm cannot fertilize the egg. It takes about 3 months after the procedure for the tubes to become blocked. You must use another form of birth control for these 3 months.  Female sterilization. This is when the female has the tubes that carry sperm tied off (vasectomy).This blocks sperm from entering the vagina during sexual intercourse. After the procedure, the man can still ejaculate fluid (semen). NATURAL PLANNING METHODS  Natural family planning. This is not having sexual intercourse or using a barrier method (condom, diaphragm, cervical cap) on days the woman could become pregnant.  Calendar method. This is keeping track of the length of each menstrual cycle and identifying when you are fertile.  Ovulation method. This is avoiding sexual intercourse during ovulation.  Symptothermal method. This is avoiding sexual intercourse during ovulation, using a thermometer and ovulation symptoms.  Post-ovulation method. This is timing sexual intercourse after you have ovulated. Regardless of which type or method of contraception you choose, it is important that you use  condoms to protect against the transmission of sexually transmitted infections (STIs). Talk with your health care provider about which form of contraception is most appropriate for you.   This information is not intended to replace advice given to you by your health care provider. Make sure you discuss any questions you have with your health care provider.   Document Released: 12/24/2004 Document Revised: 12/29/2012 Document Reviewed: 06/18/2012 Elsevier Interactive Patient Education 2016 [employer].  Postpartum Depression and Baby Blues The postpartum period begins right after the birth of a baby. During this time, there is often a great amount of joy and excitement. It is also a time of many changes in the life of the parents. Regardless of how many times a mother gives birth, each child brings new challenges and dynamics to the family. It is not unusual to have feelings of excitement along with confusing shifts in moods, emotions, and thoughts. All mothers are at risk of developing postpartum depression or the "baby blues." These mood changes can occur right after giving birth, or they may occur many months after giving birth. The baby blues or postpartum depression can be mild or severe. Additionally, postpartum depression can go away rather quickly, or it can be a long-term condition.  CAUSES Raised hormone levels and the rapid drop in those levels are thought to be a main cause of postpartum depression and the baby blues. A number of hormones change during and after pregnancy. Estrogen and progesterone usually decrease right after the delivery of your baby. The levels of thyroid hormone and various cortisol steroids also rapidly drop. Other factors that play a role in these mood changes include major life events and genetics.  RISK FACTORS If you have any of the following risks for the baby blues or postpartum depression, know what symptoms to watch out for during the postpartum period. Risk  factors that may increase the likelihood of getting the baby blues or postpartum depression include:  Having a personal or family history of depression.   Having depression while being pregnant.   Having premenstrual mood issues or mood issues related to oral contraceptives.  Having a lot of life stress.   Having marital conflict.   Lacking a social support network.   Having a baby with special needs.   Having health problems, such as diabetes.  SIGNS AND SYMPTOMS Symptoms of baby  blues include:  Brief changes in mood, such as going from extreme happiness to sadness.  Decreased concentration.   Difficulty sleeping.   Crying spells, tearfulness.   Irritability.   Anxiety.  Symptoms of postpartum depression typically begin within the first month after giving birth. These symptoms include:  Difficulty sleeping or excessive sleepiness.   Marked weight loss.   Agitation.   Feelings of worthlessness.   Lack of interest in activity or food.  Postpartum psychosis is a very serious condition and can be dangerous. Fortunately, it is rare. Displaying any of the following symptoms is cause for immediate medical attention. Symptoms of postpartum psychosis include:   Hallucinations and delusions.   Bizarre or disorganized behavior.   Confusion or disorientation.  DIAGNOSIS  A diagnosis is made by an evaluation of your symptoms. There are no medical or lab tests that lead to a diagnosis, but there are various questionnaires that a health care provider may use to identify those with the baby blues, postpartum depression, or psychosis. Often, a screening tool called the [place] Postnatal Depression Scale is used to diagnose depression in the postpartum period.  TREATMENT The baby blues usually goes away on its own in 1-2 weeks. Social support is often all that is needed. You will be encouraged to get adequate sleep and rest. Occasionally, you may be given  medicines to help you sleep.  Postpartum depression requires treatment because it can last several months or longer if it is not treated. Treatment may include individual or group therapy, medicine, or both to address any social, physiological, and psychological factors that may play a role in the depression. Regular exercise, a healthy diet, rest, and social support may also be strongly recommended.  Postpartum psychosis is more serious and needs treatment right away. Hospitalization is often needed. HOME CARE INSTRUCTIONS  Get as much rest as you can. Nap when the baby sleeps.   Exercise regularly. Some women find yoga and walking to be beneficial.   Eat a balanced and nourishing diet.   Do little things that you enjoy. Have a cup of tea, take a bubble bath, read your favorite magazine, or listen to your favorite music.  Avoid alcohol.   Ask for help with household chores, cooking, grocery shopping, or running errands as needed. Do not try to do everything.   Talk to people close to you about how you are feeling. Get support from your partner, family members, friends, or other new moms.  Try to stay positive in how you think. Think about the things you are grateful for.   Do not spend a lot of time alone.   Only take over-the-counter or prescription medicine as directed by your health care provider.  Keep all your postpartum appointments.   Let your health care provider know if you have any concerns.  SEEK MEDICAL CARE IF: You are having a reaction to or problems with your medicine. SEEK IMMEDIATE MEDICAL CARE IF:  You have suicidal feelings.   You think you may harm the baby or someone else. MAKE SURE YOU:  Understand these instructions.  Will watch your condition.  Will get help right away if you are not doing well or get worse.   This information is not intended to replace advice given to you by your health care provider. Make sure you discuss any questions  you have with your health care provider.   Document Released: 09/28/2003 Document Revised: 12/29/2012 Document Reviewed: 10/05/2012 Elsevier Interactive Patient Education [employer].  Breastfeeding Deciding to breastfeed is one of the best choices you can make for you and your baby. A change in hormones during pregnancy causes your breast tissue to grow and increases the number and size of your milk ducts. These hormones also allow proteins, sugars, and fats from your blood supply to make breast milk in your milk-producing glands. Hormones prevent breast milk from being released before your baby is born as well as prompt milk flow after birth. Once breastfeeding has begun, thoughts of your baby, as well as his or her sucking or crying, can stimulate the release of milk from your milk-producing glands.  BENEFITS OF BREASTFEEDING For Your Baby  Your first milk (colostrum) helps your baby's digestive system function better.  There are antibodies in your milk that help your baby fight off infections.  Your baby has a lower incidence of asthma, allergies, and sudden infant death syndrome.  The nutrients in breast milk are better for your baby than infant formulas and are designed uniquely for your baby's needs.  Breast milk improves your baby's brain development.  Your baby is less likely to develop other conditions, such as childhood obesity, asthma, or type 2 diabetes mellitus. For You  Breastfeeding helps to create a very special bond between you and your baby.  Breastfeeding is convenient. Breast milk is always available at the correct temperature and costs nothing.  Breastfeeding helps to burn calories and helps you lose the weight gained during pregnancy.  Breastfeeding makes your uterus contract to its prepregnancy size faster and slows bleeding (lochia) after you give birth.   Breastfeeding helps to lower your risk of developing type 2 diabetes mellitus, osteoporosis, and  breast or ovarian cancer later in life. SIGNS THAT YOUR BABY IS HUNGRY Early Signs of Hunger  Increased alertness or activity.  Stretching.  Movement of the head from side to side.  Movement of the head and opening of the mouth when the corner of the mouth or cheek is stroked (rooting).  Increased sucking sounds, smacking lips, cooing, sighing, or squeaking.  Hand-to-mouth movements.  Increased sucking of fingers or hands. Late Signs of Hunger  Fussing.  Intermittent crying. Extreme Signs of Hunger Signs of extreme hunger will require calming and consoling before your baby will be able to breastfeed successfully. Do not wait for the following signs of extreme hunger to occur before you initiate breastfeeding:  Restlessness.  A loud, strong cry.  Screaming. BREASTFEEDING BASICS Breastfeeding Initiation  Find a comfortable place to sit or lie down, with your neck and back well supported.  Place a pillow or rolled up blanket under your baby to bring him or her to the level of your breast (if you are seated). Nursing pillows are specially designed to help support your arms and your baby while you breastfeed.  Make sure that your baby's abdomen is facing your abdomen.  Gently massage your breast. With your fingertips, massage from your chest wall toward your nipple in a circular motion. This encourages milk flow. You may need to continue this action during the feeding if your milk flows slowly.  Support your breast with 4 fingers underneath and your thumb above your nipple. Make sure your fingers are well away from your nipple and your baby's mouth.  Stroke your baby's lips gently with your finger or nipple.  When your baby's mouth is open wide enough, quickly bring your baby to your breast, placing your entire nipple and as much of the colored area around your nipple (  areola) as possible into your baby's mouth.  More areola should be visible above your baby's upper lip than  below the lower lip.  Your baby's tongue should be between his or her lower gum and your breast.  Ensure that your baby's mouth is correctly positioned around your nipple (latched). Your baby's lips should create a seal on your breast and be turned out (everted).  It is common for your baby to suck about 2-3 minutes in order to start the flow of breast milk. Latching Teaching your baby how to latch on to your breast properly is very important. An improper latch can cause nipple pain and decreased milk supply for you and poor weight gain in your baby. Also, if your baby is not latched onto your nipple properly, he or she may swallow some air during feeding. This can make your baby fussy. Burping your baby when you switch breasts during the feeding can help to get rid of the air. However, teaching your baby to latch on properly is still the best way to prevent fussiness from swallowing air while breastfeeding. Signs that your baby has successfully latched on to your nipple:  Silent tugging or silent sucking, without causing you pain.  Swallowing heard between every 3-4 sucks.  Muscle movement above and in front of his or her ears while sucking. Signs that your baby has not successfully latched on to nipple:  Sucking sounds or smacking sounds from your baby while breastfeeding.  Nipple pain. If you think your baby has not latched on correctly, slip your finger into the corner of your baby's mouth to break the suction and place it between your baby's gums. Attempt breastfeeding initiation again. Signs of Successful Breastfeeding Signs from your baby:  A gradual decrease in the number of sucks or complete cessation of sucking.  Falling asleep.  Relaxation of his or her body.  Retention of a small amount of milk in his or her mouth.  Letting go of your breast by himself or herself. Signs from you:  Breasts that have increased in firmness, weight, and size 1-3 hours after  feeding.  Breasts that are softer immediately after breastfeeding.  Increased milk volume, as well as a change in milk consistency and color by the fifth day of breastfeeding.  Nipples that are not sore, cracked, or bleeding. Signs That Your [employer] is Getting Enough Milk  Wetting at least 3 diapers in a 24-hour period. The urine should be clear and pale yellow by age 721 days.  At least 3 stools in a 24-hour period by age 721 days. The stool should be soft and yellow.  At least 3 stools in a 24-hour period by age 30 days. The stool should be seedy and yellow.  No loss of weight greater than 10% of birth weight during the first 31 days of age.  Average weight gain of 4-7 ounces (113-198 g) per week after age 72 days.  Consistent daily weight gain by age 63 days, without weight loss after the age of 2 weeks. After a feeding, your baby may spit up a small amount. This is common. BREASTFEEDING FREQUENCY AND DURATION Frequent feeding will help you make more milk and can prevent sore nipples and breast engorgement. Breastfeed when you feel the need to reduce the fullness of your breasts or when your baby shows signs of hunger. This is called "breastfeeding on demand." Avoid introducing a pacifier to your baby while you are working to establish breastfeeding (the first 4-6 weeks  after your baby is born). After this time you may choose to use a pacifier. Research has shown that pacifier use during the first year of a baby's life decreases the risk of sudden infant death syndrome (SIDS). Allow your baby to feed on each breast as long as he or she wants. Breastfeed until your baby is finished feeding. When your baby unlatches or falls asleep while feeding from the first breast, offer the second breast. Because newborns are often sleepy in the first few weeks of life, you may need to awaken your baby to get him or her to feed. Breastfeeding times will vary from baby to baby. However, the following rules can serve  as a guide to help you ensure that your baby is properly fed:  Newborns (babies 60 weeks of age or younger) may breastfeed every 1-3 hours.  Newborns should not go longer than 3 hours during the day or 5 hours during the night without breastfeeding.  You should breastfeed your baby a minimum of 8 times in a 24-hour period until you begin to introduce solid foods to your baby at around 78 months of age. BREAST MILK PUMPING Pumping and storing breast milk allows you to ensure that your baby is exclusively fed your breast milk, even at times when you are unable to breastfeed. This is especially important if you are going back to work while you are still breastfeeding or when you are not able to be present during feedings. Your lactation consultant can give you guidelines on how long it is safe to store breast milk. A breast pump is a machine that allows you to pump milk from your breast into a sterile bottle. The pumped breast milk can then be stored in a refrigerator or freezer. Some breast pumps are operated by hand, while others use electricity. Ask your lactation consultant which type will work best for you. Breast pumps can be purchased, but some hospitals and breastfeeding support groups lease breast pumps on a monthly basis. A lactation consultant can teach you how to hand express breast milk, if you prefer not to use a pump. CARING FOR YOUR BREASTS WHILE YOU BREASTFEED Nipples can become dry, cracked, and sore while breastfeeding. The following recommendations can help keep your breasts moisturized and healthy:  Avoid using soap on your nipples.  Wear a supportive bra. Although not required, special nursing bras and tank tops are designed to allow access to your breasts for breastfeeding without taking off your entire bra or top. Avoid wearing underwire-style bras or extremely tight bras.  Air dry your nipples for 3-37minutes after each feeding.  Use only cotton bra pads to absorb leaked breast  milk. Leaking of breast milk between feedings is normal.  Use lanolin on your nipples after breastfeeding. Lanolin helps to maintain your skin's normal moisture barrier. If you use pure lanolin, you do not need to wash it off before feeding your baby again. Pure lanolin is not toxic to your baby. You may also hand express a few drops of breast milk and gently massage that milk into your nipples and allow the milk to air dry. In the first few weeks after giving birth, some women experience extremely full breasts (engorgement). Engorgement can make your breasts feel heavy, warm, and tender to the touch. Engorgement peaks within 3-5 days after you give birth. The following recommendations can help ease engorgement:  Completely empty your breasts while breastfeeding or pumping. You may want to start by applying warm, moist heat (in  the shower or with warm water-soaked hand towels) just before feeding or pumping. This increases circulation and helps the milk flow. If your baby does not completely empty your breasts while breastfeeding, pump any extra milk after he or she is finished.  Wear a snug bra (nursing or regular) or tank top for 1-2 days to signal your body to slightly decrease milk production.  Apply ice packs to your breasts, unless this is too uncomfortable for you.  Make sure that your baby is latched on and positioned properly while breastfeeding. If engorgement persists after 48 hours of following these recommendations, contact your health care provider or a Science writer. OVERALL HEALTH CARE RECOMMENDATIONS WHILE BREASTFEEDING  Eat healthy foods. Alternate between meals and snacks, eating 3 of each per day. Because what you eat affects your breast milk, some of the foods may make your baby more irritable than usual. Avoid eating these foods if you are sure that they are negatively affecting your baby.  Drink milk, fruit juice, and water to satisfy your thirst (about 10 glasses a  day).  Rest often, relax, and continue to take your prenatal vitamins to prevent fatigue, stress, and anemia.  Continue breast self-awareness checks.  Avoid chewing and smoking tobacco. Chemicals from cigarettes that pass into breast milk and exposure to secondhand smoke may harm your baby.  Avoid alcohol and drug use, including marijuana. Some medicines that may be harmful to your baby can pass through breast milk. It is important to ask your health care provider before taking any medicine, including all over-the-counter and prescription medicine as well as vitamin and herbal supplements. It is possible to become pregnant while breastfeeding. If birth control is desired, ask your health care provider about options that will be safe for your baby. SEEK MEDICAL CARE IF:  You feel like you want to stop breastfeeding or have become frustrated with breastfeeding.  You have painful breasts or nipples.  Your nipples are cracked or bleeding.  Your breasts are red, tender, or warm.  You have a swollen area on either breast.  You have a fever or chills.  You have nausea or vomiting.  You have drainage other than breast milk from your nipples.  Your breasts do not become full before feedings by the fifth day after you give birth.  You feel sad and depressed.  Your baby is too sleepy to eat well.  Your baby is having trouble sleeping.   Your baby is wetting less than 3 diapers in a 24-hour period.  Your baby has less than 3 stools in a 24-hour period.  Your baby's skin or the white part of his or her eyes becomes yellow.   Your baby is not gaining weight by 36 days of age. SEEK IMMEDIATE MEDICAL CARE IF:  Your baby is overly tired (lethargic) and does not want to wake up and feed.  Your baby develops an unexplained fever.   This information is not intended to replace advice given to you by your health care provider. Make sure you discuss any questions you have with your health  care provider.   Document Released: 12/24/2004 Document Revised: 09/14/2014 Document Reviewed: 06/17/2012 Elsevier Interactive Patient Education [employer].

## 2015-02-27 NOTE — Progress Notes (Signed)
Subjective:    Emily [name] is a G1P0 [redacted]w[redacted]d being seen today for her first obstetrical visit.  Her obstetrical history is significant for advanced maternal age, obesity, smoker and HTN. Patient does not intend to breast feed. Pregnancy history fully reviewed.  Patient reports nausea.  Filed Vitals:   02/27/15 0925  BP: 139/89  Pulse: 91  Temp: 98.2 F (36.8 C)  Weight: 220 lb (99.791 kg)    HISTORY: OB History  Gravida Para Term Preterm AB SAB TAB Ectopic Multiple Living  1             # Outcome Date GA Lbr Len/2nd Weight Sex Delivery Anes PTL Lv  1 Current              Past Medical History  Diagnosis Date  . Headache(784.0)     migraines  . Multiple sclerosis ([place])   . Asthma as a child  . GERD (gastroesophageal reflux disease)   . Urinary urgency   . Anxiety   . Perforated bowel ([place]) 2009  . Migraines   . Depression   . Chronic back pain   . Chronic pain   . Ovarian cyst   . Panic attack   . Pseudoseizures   . Conversion disorder   . JC virus antibody positive   . PTSD (post-traumatic stress disorder)   . HTN (hypertension) 02/27/2015   Past Surgical History  Procedure Laterality Date  . Extremity cyst excision  1994    right leg  . 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 Colostomy closure  04/2007  . Scar revision  01/21/2011    Procedure: SCAR REVISION;  Surgeon: Hermelinda Dellen;  Location: [facility];  Service: Plastics;  Laterality: N/A;  exploration of scar of abdomen and repair of defect  . Hernia repair    . Abdominal surgery    . Appendectomy     Family History  Problem Relation Age of Onset  . Diabetes Mother   . Hypertension Mother   . Diabetes Father   . Hypertension Father   . Arthritis Father   . Cancer Maternal Grandmother   . Cancer Maternal Grandfather      Exam    Uterus:     Pelvic Exam:    Perineum: Normal Perineum   Vulva: normal   Vagina:  normal mucosa, normal discharge   pH:    Cervix: nulliparous appearance closed and long   Adnexa: normal adnexa and no mass, fullness, tenderness   Bony Pelvis: gynecoid  System: Breast:  normal appearance, no masses or tenderness   Skin: normal coloration and turgor, no rashes    Neurologic: oriented, no focal deficits   Extremities: normal strength, tone, and muscle mass   HEENT extra ocular movement intact   Mouth/Teeth mucous membranes moist, pharynx normal without lesions and dental hygiene good   Neck supple and no masses   Cardiovascular: regular rate and rhythm   Respiratory:  chest clear, no wheezing, crepitations, rhonchi, normal symmetric air entry   Abdomen: soft, non-tender; bowel sounds normal; no masses,  no organomegaly   Urinary:       Assessment:    Pregnancy: G1P0 Patient Active Problem List   Diagnosis Date Noted  . Obesity complicating pregnancy, childbirth, or puerperium, antepartum 02/27/2015    Priority: Medium  . Tobacco smoking affecting pregnancy, antepartum 02/27/2015    Priority: Medium  . AMA (advanced maternal age) primigravida 35+ 02/27/2015  Priority: Medium  . HTN (hypertension) 02/27/2015    Priority: Medium  . Preexisting hypertension complicating pregnancy, antepartum 02/27/2015    Priority: Medium  . Perforated bowel ([place]) 02/27/2015    Priority: Medium  . Supervision of high-risk pregnancy 12/27/2014    Priority: Medium  . Anxiety 12/27/2014    Priority: Medium  . Depression 12/27/2014    Priority: Medium  . Multiple sclerosis ([place]) 12/27/2014    Priority: Medium  . Migraine 12/27/2014    Priority: Medium  . Seizure disorder ([place]) 12/27/2014    Priority: Medium  . Smoker 12/27/2014    Priority: Medium  . Obesity 12/27/2014    Priority: Medium        Plan:     Initial labs drawn. Prenatal vitamins. Problem list reviewed and updated. Genetic Screening discussed NIPS: requested. Patient also referred to genetic counseling secondary to [facility]  Ultrasound  discussed; fetal survey: ordered. Refill on Rx provided Patient to follow up with a Neurologist Baseline labs ordered secondary to [facility] Smoking cessation discussed  Follow up in 4 weeks. 50% of 30 min visit spent on counseling and coordination of care.     Mirabella Hilario 02/27/2015

## 2015-02-27 NOTE — Progress Notes (Signed)
Initial prenatal info packet given Initial prenatal labs Received flu @ [facility] 09/2014

## 2015-02-27 NOTE — Progress Notes (Signed)
U/S and genetic counseling scheduled for 03/21/2015 @1 :00PM

## 2015-02-28 LAB — PRENATAL PROFILE (SOLSTAS)
ANTIBODY SCREEN: NEGATIVE
BASOS ABS: 0 10*3/uL (ref 0.0–0.1)
BASOS PCT: 0 % (ref 0–1)
EOS ABS: 0.1 10*3/uL (ref 0.0–0.7)
EOS PCT: 1 % (ref 0–5)
HCT: 41.2 % (ref 36.0–46.0)
HEMOGLOBIN: 14.2 g/dL (ref 12.0–15.0)
HIV 1&2 Ab, 4th Generation: NONREACTIVE
Hepatitis B Surface Ag: NEGATIVE
LYMPHS ABS: 2 10*3/uL (ref 0.7–4.0)
Lymphocytes Relative: 16 % (ref 12–46)
MCH: 30.7 pg (ref 26.0–34.0)
MCHC: 34.5 g/dL (ref 30.0–36.0)
MCV: 89.2 fL (ref 78.0–100.0)
MPV: 9.8 fL (ref 8.6–12.4)
Monocytes Absolute: 0.5 10*3/uL (ref 0.1–1.0)
Monocytes Relative: 4 % (ref 3–12)
NEUTROS PCT: 79 % — AB (ref 43–77)
Neutro Abs: 10.1 10*3/uL — ABNORMAL HIGH (ref 1.7–7.7)
PLATELETS: 401 10*3/uL — AB (ref 150–400)
RBC: 4.62 MIL/uL (ref 3.87–5.11)
RDW: 13.9 % (ref 11.5–15.5)
RH TYPE: POSITIVE
Rubella: 1.46 Index — ABNORMAL HIGH (ref <0.90)
WBC: 12.8 10*3/uL — AB (ref 4.0–10.5)

## 2015-02-28 LAB — COMPREHENSIVE METABOLIC PANEL
ALK PHOS: 72 U/L (ref 33–115)
ALT: 9 U/L (ref 6–29)
AST: 12 U/L (ref 10–30)
Albumin: 3.7 g/dL (ref 3.6–5.1)
BILIRUBIN TOTAL: 0.3 mg/dL (ref 0.2–1.2)
BUN: 9 mg/dL (ref 7–25)
CALCIUM: 8.8 mg/dL (ref 8.6–10.2)
CO2: 16 mmol/L — AB (ref 20–31)
CREATININE: 0.54 mg/dL (ref 0.50–1.10)
Chloride: 104 mmol/L (ref 98–110)
GLUCOSE: 97 mg/dL (ref 65–99)
Potassium: 3.9 mmol/L (ref 3.5–5.3)
SODIUM: 135 mmol/L (ref 135–146)
Total Protein: 6.9 g/dL (ref 6.1–8.1)

## 2015-02-28 LAB — GLUCOSE TOLERANCE, 1 HOUR (50G) W/O FASTING: Glucose, 1 Hr, gestational: 86 mg/dL (ref <140)

## 2015-02-28 LAB — GC/CHLAMYDIA PROBE AMP (~~LOC~~) NOT AT ARMC
Chlamydia: NEGATIVE
Neisseria Gonorrhea: NEGATIVE

## 2015-03-01 ENCOUNTER — Telehealth: Payer: Self-pay | Admitting: General Practice

## 2015-03-01 ENCOUNTER — Telehealth: Payer: Self-pay

## 2015-03-01 LAB — CULTURE, OB URINE: Colony Count: 60000

## 2015-03-01 MED ORDER — CEPHALEXIN 500 MG PO CAPS: 500.0000 mg | ORAL_CAPSULE | Freq: Four times a day (QID) | ORAL | Status: DC

## 2015-03-01 NOTE — Telephone Encounter (Signed)
Pt is aware she has a UTI and she will go and pick up medicine from the pharmacy.

## 2015-03-01 NOTE — Addendum Note (Signed)
Addended by: Mora Bellman on: 03/01/2015 08:49 AM   Modules accepted: Orders

## 2015-03-01 NOTE — Telephone Encounter (Signed)
Opened in error

## 2015-03-02 ENCOUNTER — Telehealth: Payer: Self-pay | Admitting: *Deleted

## 2015-03-02 LAB — PROTEIN, URINE, 24 HOUR

## 2015-03-02 NOTE — Telephone Encounter (Signed)
Called pt regarding 24 hr urine collection which was ordered on 2/20 and asked when she would be bringing the specimen.  Pt stated that she was not given instructions to collect a 24 hr urine specimen. I advised that this was an oversight and apologized. I explained the importance of this test and asked when pt could come to clinic to obtain the jug and instructions for collection. She stated that her car is not working and she is not sure. I advised pt of our hours of operation and asked if she could please try to come in sometime next week to obtain the jug from our lab personnel. Pt agreed and voiced understanding of information and given

## 2015-03-02 NOTE — Addendum Note (Signed)
Addended by: Langston Reusing on: 03/02/2015 03:20 PM   Modules accepted: Orders

## 2015-03-04 LAB — FENTANYL (GC/LC/MS), URINE
Fentanyl, confirm: NEGATIVE ng/mL (ref <0.5)
NORFENTANYL (GC/MS) CONFIRM: NEGATIVE ng/mL (ref <0.5)

## 2015-03-04 LAB — BENZODIAZEPINES (GC/LC/MS), URINE
ALPRAZOLAMU: NEGATIVE ng/mL (ref <25)
Clonazepam metabolite (GC/LC/MS), ur confirm: NEGATIVE ng/mL (ref <25)
FLURAZEPAMU: NEGATIVE ng/mL (ref <50)
LORAZEPAMU: NEGATIVE ng/mL (ref <50)
MIDAZOLAMU: NEGATIVE ng/mL (ref <50)
Nordiazepam (GC/LC/MS), ur confirm: NEGATIVE ng/mL (ref <50)
Oxazepam (GC/LC/MS), ur confirm: NEGATIVE ng/mL (ref <50)
TEMAZEPAMU: NEGATIVE ng/mL (ref <50)
TRIAZOLAMU: NEGATIVE ng/mL (ref <50)

## 2015-03-04 LAB — CANNABANOIDS (GC/LC/MS), URINE: THC-COOH UR CONFIRM: 86 ng/mL — AB (ref <5)

## 2015-03-07 LAB — PRESCRIPTION MONITORING PROFILE (19 PANEL)
Amphetamine/Meth: NEGATIVE ng/mL
BARBITURATE SCREEN, URINE: NEGATIVE ng/mL
Buprenorphine, Urine: NEGATIVE ng/mL
CARISOPRODOL, URINE: NEGATIVE ng/mL
COCAINE METABOLITES: NEGATIVE ng/mL
Creatinine, Urine: 246.26 mg/dL (ref >20.0)
MDMA URINE: NEGATIVE ng/mL
Meperidine, Ur: NEGATIVE ng/mL
Methadone Screen, Urine: NEGATIVE ng/mL
Methaqualone: NEGATIVE ng/mL
NITRITES URINE, INITIAL: NEGATIVE ug/mL
OPIATE SCREEN, URINE: NEGATIVE ng/mL
OXYCODONE SCRN UR: NEGATIVE ng/mL
PH URINE, INITIAL: 5.9 pH (ref 4.5–8.9)
PROPOXYPHENE: NEGATIVE ng/mL
Phencyclidine, Ur: NEGATIVE ng/mL
TAPENTADOLUR: NEGATIVE ng/mL
TRAMADOL UR: NEGATIVE ng/mL
ZOLPIDEM, URINE: NEGATIVE ng/mL

## 2015-03-08 ENCOUNTER — Other Ambulatory Visit: Payer: Self-pay | Admitting: Physician Assistant

## 2015-03-08 ENCOUNTER — Encounter: Payer: Self-pay | Admitting: Obstetrics and Gynecology

## 2015-03-08 DIAGNOSIS — O99320 Drug use complicating pregnancy, unspecified trimester: Secondary | ICD-10-CM | POA: Insufficient documentation

## 2015-03-09 ENCOUNTER — Telehealth: Payer: Self-pay | Admitting: *Deleted

## 2015-03-09 DIAGNOSIS — O10912 Unspecified pre-existing hypertension complicating pregnancy, second trimester: Secondary | ICD-10-CM

## 2015-03-09 MED ORDER — LABETALOL HCL 200 MG PO TABS: 200.0000 mg | ORAL_TABLET | Freq: Two times a day (BID) | ORAL | Status: DC

## 2015-03-09 NOTE — Telephone Encounter (Signed)
Emily [name] called and left a message yesterday afternoon that had labetolol written in [facility] and then saw Dr. Elly Modena and was supposed to get all  Her meds refilled, but did not get labetolol refilled.   Called [facility] and she states she didn't realize she needed a refill until after the visit.  Refill approved by Dr. Ihor Dow because Dr. Elly Modena not available.  Kandyce states took last dose yesterday. Informed Ivorie we will send in a one month supply- will need to get refills at next visit once doctor evaluates her response to the medication/dosage. She voiced understanding.

## 2015-03-10 ENCOUNTER — Other Ambulatory Visit: Payer: Self-pay | Admitting: Physician Assistant

## 2015-03-14 DIAGNOSIS — R569 Unspecified convulsions: Secondary | ICD-10-CM | POA: Diagnosis not present

## 2015-03-14 DIAGNOSIS — Z720 Tobacco use: Secondary | ICD-10-CM | POA: Diagnosis not present

## 2015-03-14 DIAGNOSIS — Z331 Pregnant state, incidental: Secondary | ICD-10-CM | POA: Diagnosis not present

## 2015-03-14 DIAGNOSIS — G35 Multiple sclerosis: Secondary | ICD-10-CM | POA: Diagnosis not present

## 2015-03-14 DIAGNOSIS — F419 Anxiety disorder, unspecified: Secondary | ICD-10-CM | POA: Diagnosis not present

## 2015-03-15 ENCOUNTER — Encounter (HOSPITAL_COMMUNITY): Payer: Self-pay | Admitting: Obstetrics and Gynecology

## 2015-03-17 ENCOUNTER — Telehealth: Payer: Self-pay | Admitting: General Practice

## 2015-03-17 DIAGNOSIS — F445 Conversion disorder with seizures or convulsions: Secondary | ICD-10-CM

## 2015-03-17 NOTE — Telephone Encounter (Signed)
Patient called and left message stating she is calling to see if Dr Elly Modena made a decision about the diazepam. Spoke to Dr Elly Modena, who states patient was supposed to follow up with neurologist for this to see if the medication is indicated. Called and discussed with patient. Patient states she doesn't have a neurologist and that she was referred to one last year but they won't see her because she missed several appointments she didn't know she had. Called that neurology office who states they will not see the patient as she was dismissed from the office. Called patient and told her we are working on a referral and will call her back when we hear back. Patient verbalized understanding and had no questions

## 2015-03-21 ENCOUNTER — Encounter (HOSPITAL_COMMUNITY): Payer: Self-pay

## 2015-03-21 ENCOUNTER — Ambulatory Visit (HOSPITAL_COMMUNITY)
Admission: RE | Admit: 2015-03-21 | Discharge: 2015-03-21 | Disposition: A | Discharge status: Home / Self Care | Payer: Medicare Other | Source: Ambulatory Visit | Attending: Obstetrics and Gynecology | Admitting: Obstetrics and Gynecology

## 2015-03-21 ENCOUNTER — Other Ambulatory Visit: Payer: Self-pay | Admitting: Obstetrics and Gynecology

## 2015-03-21 VITALS — BP 146/102 | HR 92 | Wt 227.0 lb

## 2015-03-21 DIAGNOSIS — G35 Multiple sclerosis: Secondary | ICD-10-CM

## 2015-03-21 DIAGNOSIS — O99352 Diseases of the nervous system complicating pregnancy, second trimester: Secondary | ICD-10-CM | POA: Insufficient documentation

## 2015-03-21 DIAGNOSIS — O09522 Supervision of elderly multigravida, second trimester: Secondary | ICD-10-CM | POA: Diagnosis not present

## 2015-03-21 DIAGNOSIS — Z36 Encounter for antenatal screening of mother: Secondary | ICD-10-CM | POA: Insufficient documentation

## 2015-03-21 DIAGNOSIS — O09512 Supervision of elderly primigravida, second trimester: Secondary | ICD-10-CM

## 2015-03-21 DIAGNOSIS — Z3A19 19 weeks gestation of pregnancy: Secondary | ICD-10-CM

## 2015-03-21 DIAGNOSIS — Z0489 Encounter for examination and observation for other specified reasons: Secondary | ICD-10-CM

## 2015-03-21 DIAGNOSIS — O09519 Supervision of elderly primigravida, unspecified trimester: Secondary | ICD-10-CM | POA: Insufficient documentation

## 2015-03-21 DIAGNOSIS — O0992 Supervision of high risk pregnancy, unspecified, second trimester: Secondary | ICD-10-CM

## 2015-03-21 DIAGNOSIS — Z315 Encounter for genetic counseling: Secondary | ICD-10-CM | POA: Diagnosis not present

## 2015-03-21 DIAGNOSIS — O99332 Smoking (tobacco) complicating pregnancy, second trimester: Secondary | ICD-10-CM | POA: Diagnosis not present

## 2015-03-21 DIAGNOSIS — O99212 Obesity complicating pregnancy, second trimester: Secondary | ICD-10-CM

## 2015-03-21 DIAGNOSIS — IMO0002 Reserved for concepts with insufficient information to code with codable children: Secondary | ICD-10-CM

## 2015-03-21 DIAGNOSIS — Z3689 Encounter for other specified antenatal screening: Secondary | ICD-10-CM

## 2015-03-21 DIAGNOSIS — O10012 Pre-existing essential hypertension complicating pregnancy, second trimester: Secondary | ICD-10-CM

## 2015-03-21 DIAGNOSIS — O99322 Drug use complicating pregnancy, second trimester: Secondary | ICD-10-CM

## 2015-03-21 DIAGNOSIS — O99320 Drug use complicating pregnancy, unspecified trimester: Secondary | ICD-10-CM

## 2015-03-21 DIAGNOSIS — O10912 Unspecified pre-existing hypertension complicating pregnancy, second trimester: Secondary | ICD-10-CM

## 2015-03-21 NOTE — Progress Notes (Signed)
Genetic Counseling  High-Risk Gestation Note  Appointment Date:  03/21/2015 Referred By: Mora Bellman, MD Date of Birth:  04-07-1980 Partner:  Nestor Ramp.    Pregnancy History: G1P0 Estimated Date of Delivery: 08/15/15 Estimated Gestational Age: [redacted]w[redacted]d Attending: Renella Cunas, MD   Emily [name] and her partner, Nestor Ramp., were seen for genetic counseling because of a maternal age of 35.    In summary:  Reviewed AMA and the associated risks for fetal aneuploidy  Discussed available screening and diagnostic testing options  Patient elected to have detailed ultrasound today; anatomy not fully visualized; no markers or anomalies seen  Patient elected to have NIPS-Panorama today  Amniocentesis declined     Med hx:  Patient has MS; discussed genetics and recurrence risks of autoimmune conditions  Patient has mental illness; discussed the usual multifactorial inheritance and the associated recurrence risks  They were counseled regarding maternal age and the association with risk for chromosome conditions due to nondisjunction with aging of the ova.   We reviewed chromosomes, nondisjunction, and the associated 1 in 44 risk for fetal aneuploidy related to a maternal age of 35 y.o. at [redacted]w[redacted]d gestation. They were counseled that the risk for aneuploidy decreases as gestational age increases, accounting for those pregnancies which spontaneously abort.  We specifically discussed Down syndrome (trisomy 10), trisomies 11 and 57, and sex chromosome aneuploidies (47,XXX and 47,XXY) including the common features and prognoses of each.   We reviewed available screening options including Quad screen, noninvasive prenatal screening (NIPS)/cell free DNA (cfDNA) testing, and detailed ultrasound.  They were counseled that screening tests are used to modify a patient's a priori risk for aneuploidy, typically based on age. This estimate provides a pregnancy specific risk  assessment. We reviewed the benefits and limitations of each option. Specifically, we discussed the conditions for which each test screens, the detection rates, and false positive rates of each. They were also counseled regarding diagnostic testing via amniocentesis. We reviewed the approximate 1 in [id number] risk for complications for amniocentesis, including spontaneous pregnancy loss. After consideration of all the options, they elected to proceed with NIPS.  Those results will be available in 5-7 days.    The patient also expressed interest in having a detailed ultrasound.  A complete ultrasound was performed today. The ultrasound report will be documented separately. There were no visualized fetal anomalies or markers suggestive of aneuploidy; however, the anatomy was not fully visualized secondary to maternal body habitus and fetal positioning. Amniocentesis was declined today.  They understand that screening tests cannot rule out all birth defects or genetic syndromes. The patient was advised of this limitation and states she still does not want additional testing at this time.   Mrs. Lotts was provided with written information regarding cystic fibrosis (CF) including the carrier frequency and incidence in the Caucasian population, the availability of carrier testing and prenatal diagnosis if indicated.  In addition, we discussed that CF is routinely screened for as part of the [place] newborn screening panel.  She declined testing today.   Both family histories were reviewed and found to be noncontributory for birth defects, intellectual disability, and known genetic conditions. Without further information regarding the provided family history, an accurate genetic risk cannot be calculated. Further genetic counseling is warranted if more information is obtained.  Ms. Oelfke denied exposure to environmental toxins or chemical agents. She denied the use of alcohol and street drugs. She reported that she smokes  1/2 ppd. We discussed the  implications and counseled regarding cessation. She denied significant viral illnesses during the course of her pregnancy. Her medical and surgical histories were contributory for multiple sclerosis.  We discussed that this is one condition in the family of conditions known as autoimmune conditions. Autoimmune conditions occur when an individual's body launches an abnormal immune response and begins to destroy its own cells. There are several different autoimmune conditions. While we do know that autoimmune conditions tend to "cluster" within a family, they do not follow a clear pattern of inheritance. When there is a person in the family with an autoimmune condition, there is an increased chance for others in the family to develop an autoimmune condition, but it may be a different condition. Specific risk factor information is not available. Genetic testing is not available at this time to predict who may develop an autoimmune condition.  In addition, Ms. Alonzo reported that she has significant anxiety and panic attacks. We discussed that for the majority of cases of mental health conditions, such as anxiety, an underlying genetic cause is not known but a combination of genetic and environmental factors (multifactorial inheritance) are suspected to contribute to the onset of symptoms.  Recurrence risk of anxiety for first degree relatives is estimated to be approximately 10-15%, in the case of multifactorial inheritance observed. In some families, mental health conditions may be inherited in a dominant pattern, meaning that when one parent has the condition each child could have up to a 50% risk to inherit the condition. We discussed that it might be helpful for pediatricians to be aware of this family history to ensure that family members are followed appropriately. The patient understands that prenatal testing or screening is not available for the majority of mental health conditions.    I counseled this couple regarding the above risks and available options.  The approximate face-to-face time with the genetic counselor was 43 minutes.  Filbert Schilder Certified [employer]

## 2015-03-22 IMAGING — CT CT ABD-PELV W/ CM
2 of 3 series · 17 of 46 positions shown, 19 images · IV contrast (omnipaque)
Comparison: Radiographs dated 11/29/2013 and abdomen and pelvis CT
dated 09/17/2013.

CLINICAL DATA: Left lower quadrant abdominal pain, nausea and
vomiting which started gradually this morning. Previous
appendectomy, bowel resection and colostomy closure. Previous hernia
repair.

EXAM:
CT ABDOMEN AND PELVIS WITH CONTRAST
TECHNIQUE: Multidetector CT imaging of the abdomen and pelvis was performed
using the standard protocol following bolus administration of
intravenous contrast.
CONTRAST:  100mL OMNIPAQUE IOHEXOL 300 MG/ML SOLN, 50mL OMNIPAQUE
IOHEXOL 300 MG/ML SOLN

[Series 2: abd_pel_with 5.0 b40f · axial · 0.80mm/px · z∈[-590,-160]mm · 14 of 100 slices shown, 16 images]
[im 7/100  soft-tissue]
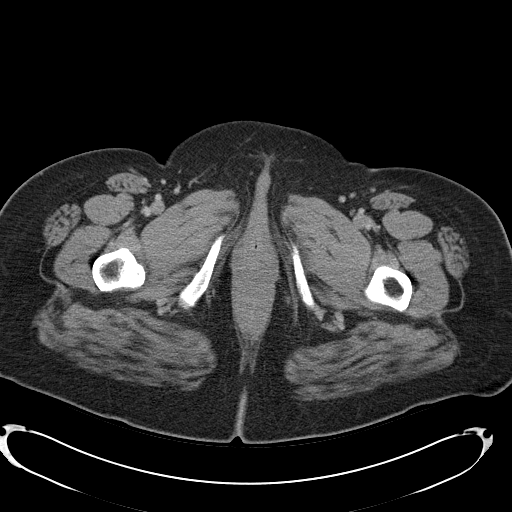
[im 7/100  bone]
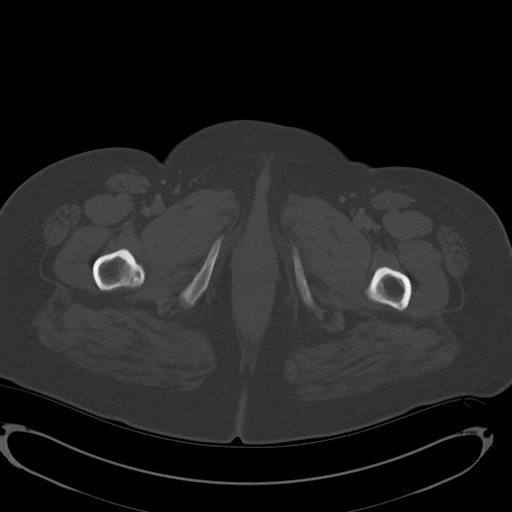
[im 13/100  soft-tissue]
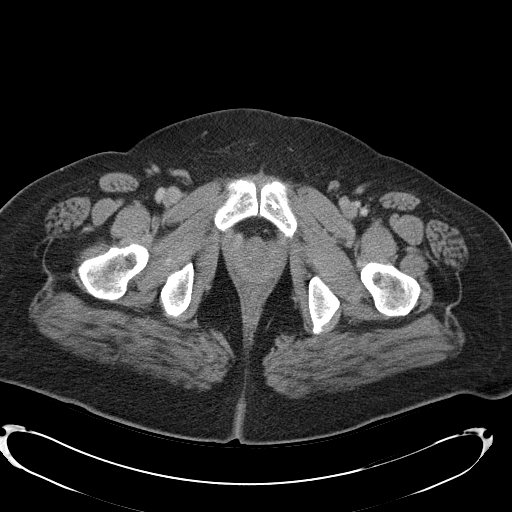
[im 20/100  soft-tissue]
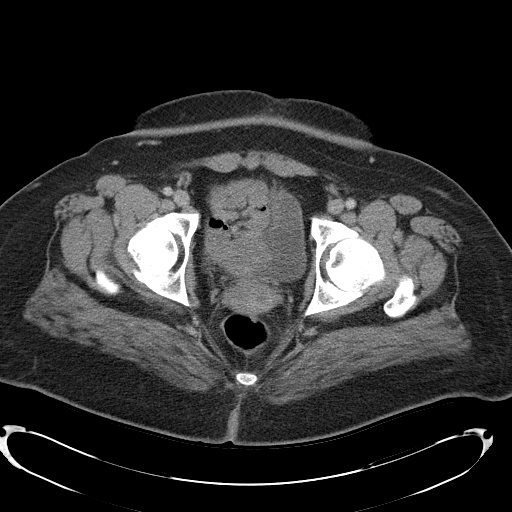
[im 26/100  soft-tissue]
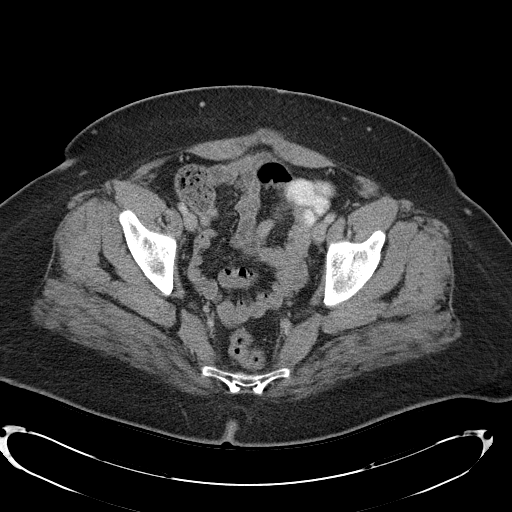
[im 32/100  soft-tissue]
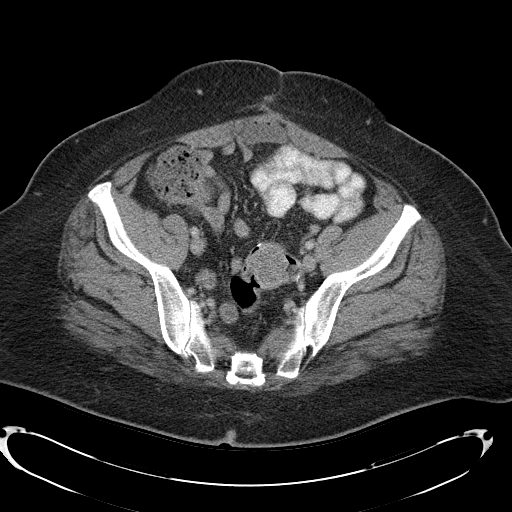
[im 39/100  soft-tissue]
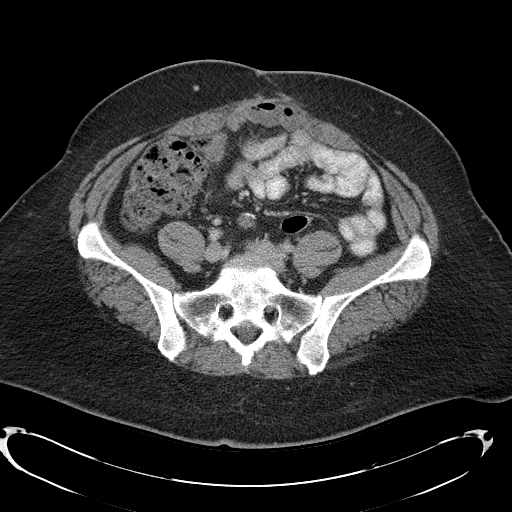
[im 45/100  soft-tissue]
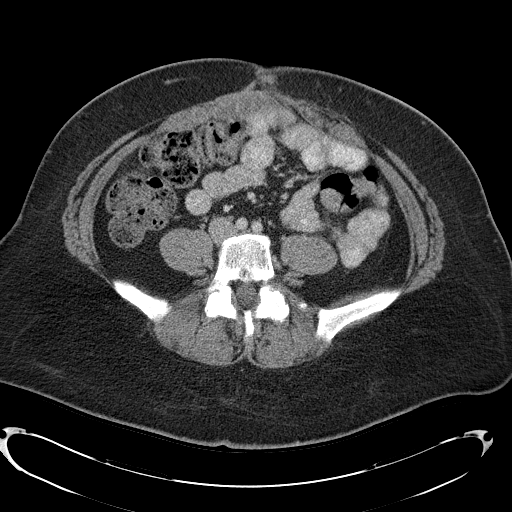
[im 55/100  soft-tissue]
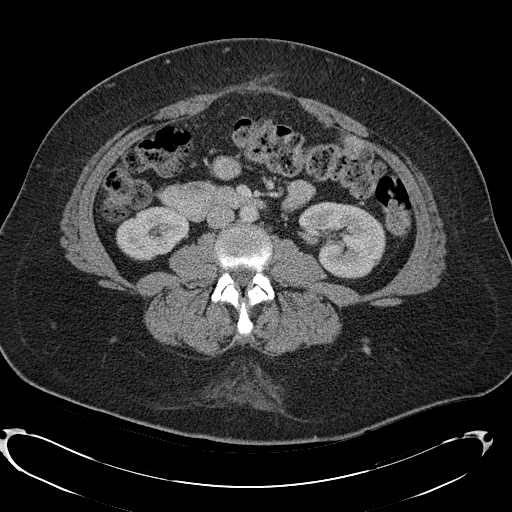
[im 61/100  soft-tissue]
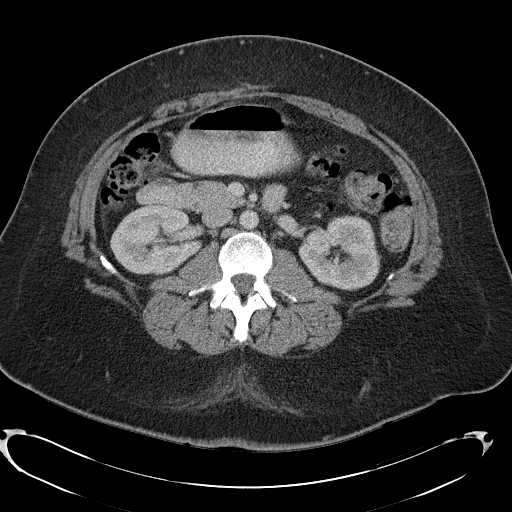
[im 61/100  bone]
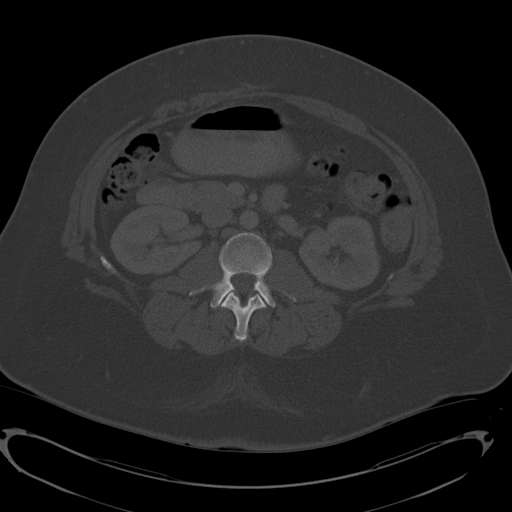
[im 68/100  soft-tissue]
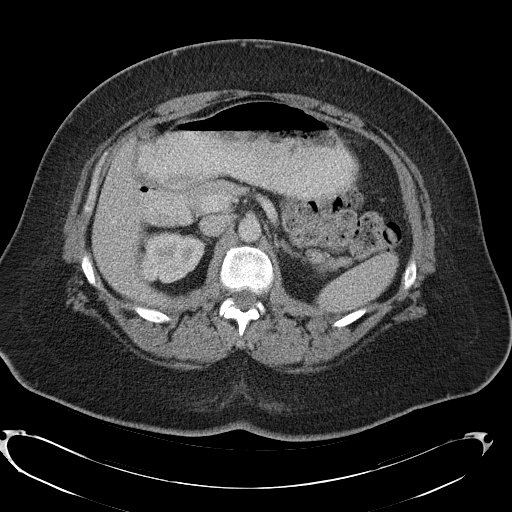
[im 74/100  soft-tissue]
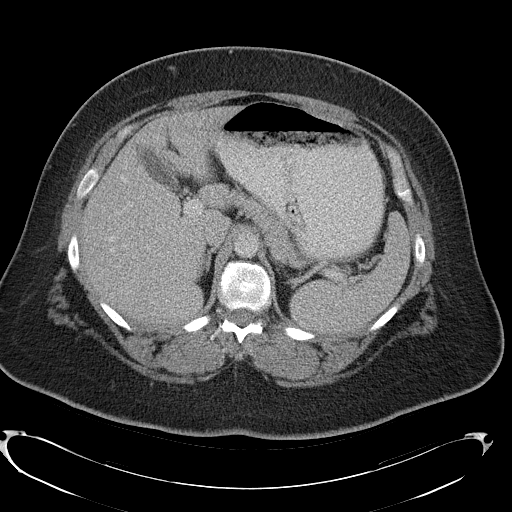
[im 80/100  soft-tissue]
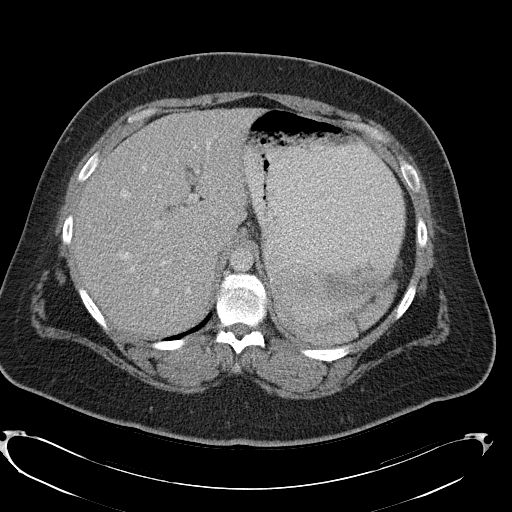
[im 87/100  soft-tissue]
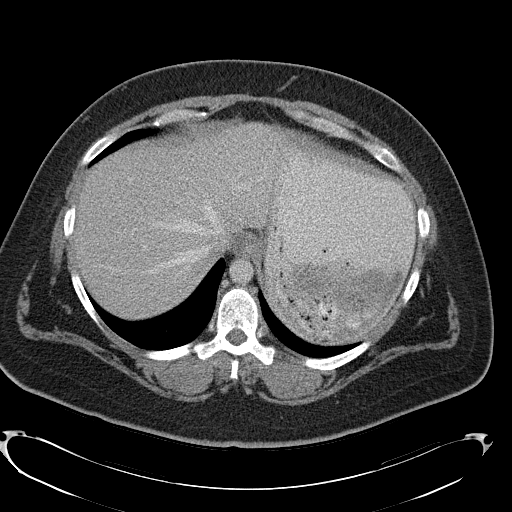
[im 93/100  soft-tissue]
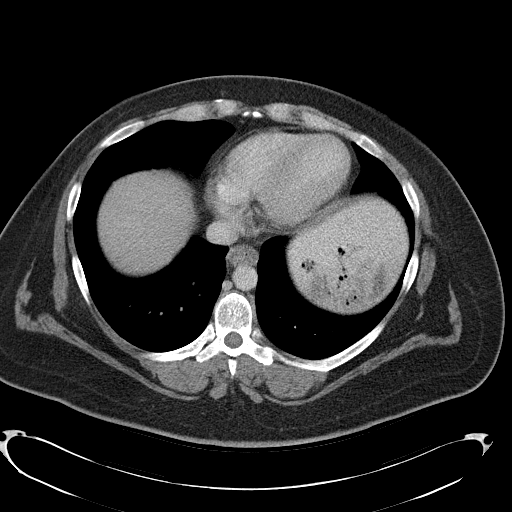

[Series 3: abd_pel_with 3.0 spo cor · coronal · 0.82mm/px · 3 of 103 slices shown]
[im 35/103  soft-tissue]
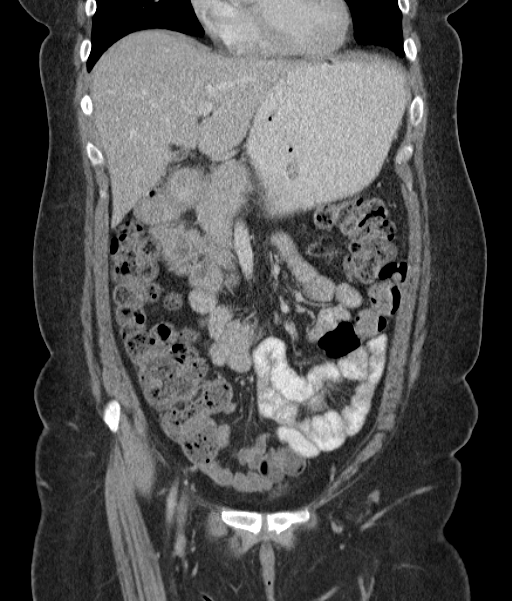
[im 46/103  soft-tissue]
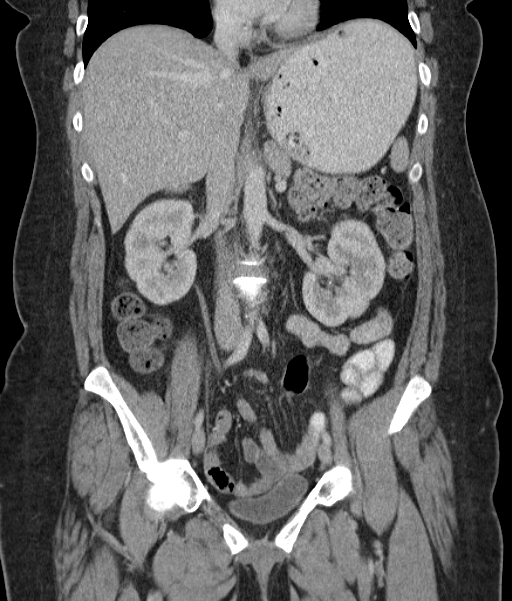
[im 57/103  soft-tissue]
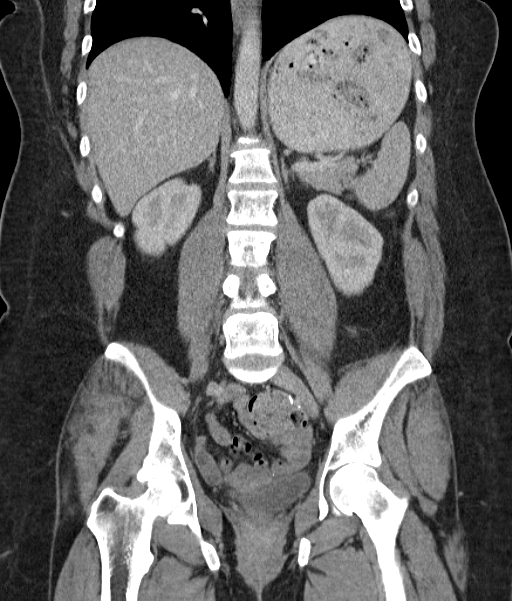

[17 of 46 positions shown; findings below may reference images not displayed]

FINDINGS: Three tiny left renal calculi. No bladder, ureteral or right renal
calculi and no hydronephrosis. Sigmoid colon anastomosis. There is
also a staple line at the posterior aspect of the cecum. The
appendix is not visualized and is surgically absent by history. No
bowel dilatation. Prominent stool in the colon.

Unremarkable liver, spleen, pancreas, gallbladder and adrenal
glands. The uterus is somewhat small. No adnexal masses or enlarged
lymph nodes. Clear lung bases. Lumbar and lower thoracic spine
degenerative changes.
IMPRESSION: 1. No acute abnormality.
2. Prominent stool.
3. 3 tiny, nonobstructing left renal calculi.

## 2015-03-24 NOTE — Telephone Encounter (Signed)
Patient called requesting call regarding refills on her medications. Patient states that she spoke with her primary care doctor and he is unwilling to refill Rx, because her did not write them, and to contact us. Please call patient.

## 2015-03-27 ENCOUNTER — Encounter: Payer: Self-pay | Admitting: Family Medicine

## 2015-03-27 ENCOUNTER — Telehealth: Payer: Self-pay | Admitting: *Deleted

## 2015-03-27 DIAGNOSIS — O10912 Unspecified pre-existing hypertension complicating pregnancy, second trimester: Secondary | ICD-10-CM

## 2015-03-27 MED ORDER — LABETALOL HCL 200 MG PO TABS: 200.0000 mg | ORAL_TABLET | Freq: Two times a day (BID) | ORAL | Status: DC

## 2015-03-27 NOTE — Telephone Encounter (Signed)
Pt called and stated that she is running out of labetalol and doesn't have appointment until next week. Advised patient that we will send it in for her.

## 2015-03-28 ENCOUNTER — Ambulatory Visit (INDEPENDENT_AMBULATORY_CARE_PROVIDER_SITE_OTHER): Payer: Medicare Other | Admitting: Neurology

## 2015-03-28 ENCOUNTER — Telehealth: Payer: Self-pay | Admitting: Neurology

## 2015-03-28 ENCOUNTER — Encounter: Payer: Self-pay | Admitting: Neurology

## 2015-03-28 VITALS — BP 112/88 | HR 100 | Wt 229.0 lb

## 2015-03-28 DIAGNOSIS — F449 Dissociative and conversion disorder, unspecified: Secondary | ICD-10-CM | POA: Insufficient documentation

## 2015-03-28 DIAGNOSIS — F411 Generalized anxiety disorder: Secondary | ICD-10-CM | POA: Insufficient documentation

## 2015-03-28 DIAGNOSIS — G35 Multiple sclerosis: Secondary | ICD-10-CM | POA: Diagnosis not present

## 2015-03-28 DIAGNOSIS — F445 Conversion disorder with seizures or convulsions: Secondary | ICD-10-CM | POA: Diagnosis not present

## 2015-03-28 DIAGNOSIS — G43109 Migraine with aura, not intractable, without status migrainosus: Secondary | ICD-10-CM | POA: Diagnosis not present

## 2015-03-28 DIAGNOSIS — F4001 Agoraphobia with panic disorder: Secondary | ICD-10-CM | POA: Insufficient documentation

## 2015-03-28 MED ORDER — DIAZEPAM 5 MG PO TABS: ORAL_TABLET | ORAL | Status: DC

## 2015-03-28 NOTE — Progress Notes (Signed)
NEUROLOGY CONSULTATION NOTE  Emily [name] MRN: [id number] DOB: Sep 01, 1980  Referring provider: Dr. Mora Bellman Primary care provider: Holly [name]  Reason for consult:  MS, seizures, headaches  Dear Dr Sharlene Dory:  Thank you for your kind referral of [name] for consultation of the above symptoms. Although her history is well known to you, please allow me to reiterate it for the purpose of our medical record. Records and images were personally reviewed where available.  HISTORY OF PRESENT ILLNESS: This is a 35 year old right-handed woman with a history of relapsing-remitting multiple sclerosis, headaches, anxiety with panic attacks, conversion disorder with seizures, presenting to establish care. She is now [redacted] weeks pregnant and her OB has asked her to see neurology to determine if diazepam for anxiety/seizures would be a medication she can use, understanding this is pregnancy category D versus risks of injuring herself from falls when she has the psychogenic seizures. Records from her previous neurologists at [facility] [facility] were reviewed. She was diagnosed with MS in June 2008 while living in [place] state, she started having right thigh pain and right eye blurred vision. She was diagnosed with right optic neuritis and underwent MRI brain and lumbar puncture which confirmed a diagnosis of MS. 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 She has been on Gilenya since 2014 without any relapse. This was discontinued in December 2016 when she found out she was pregnant. She has a history of chronic pain and migraines, and was taking Depakote and Topamax for headache prophylaxis, also stopped in December 2016. She reports having Botox for her migraines, but so far migraines are not as bad as they were.  She started having shaking episodes in  April 2016. Initially Depakote dose was increased to 500mg  BID. She was back in May 2016 with multiple episodes of syncope, palpitations, a sensation of her heart jumping out of her chest. She also reported lightheadedness, facial numbness, and numbness in her hands and feet. On her last visit in June 2016 at [facility], shaking episodes were discussed. Events always occur after a stressful event. She had a spell in [place] where she had a normal EEG and was diagnosed with a conversion disorder. She had seen Dr. Darleene Cleaver and reportedly told her he could not help her because "her case was too complex." She started seeing a psychiatrist in [place], however since she switched neurologists, she reports that he cannot see her anymore. She has not been able to establish care with [facility] and continues to have anxiety and panic attacks "all the time." She is diaphoretic in the office today and reports she always feels anxious. When she starts having one of her seizures, she would have significant anxiety, "my body can't deal and goes into a seizure," she hears her heartbeat in her ears. If it slowly builds up, she can get herself to a safe place, however other times it "just hits me," and she falls to the ground and has injured herself. She reports falling out one time in the bathroom during a doctor visit. She is currently on Fluoxetine and Buspirone. Clonazepam in the past did not help. She would usually go to the ER for these spells, which would quiet down once she gets Benadryl and Diazepam. She reports having 3 episodes in the past 2 months.   Diagnostic Data:  I personally reviewed MRI  brain with and without contrast done with [facility] last 10/02/13 which did not show any acute changes, there was multiple T2/FLAIR lesions in the subcortical and deep white matter regions, no abnormal enhancement. Hippocampi appear asymmetric, smaller on the right without abnormal signal or enhancement.  MRI C-spine with and  without contrast 10/18/13 showed hazy T2 hyperintensity within the cord from C2-3 to C4-5 without abnormal enhancement.  per [facility] - MRI brain done 03/18/14: "No change in numerous multifocal T2 hyperintense white matter lesions relative to prior MRI from 04/17/2013 most consistent with demyelinating lesions given history of multiple sclerosis. No evidence of active demyelination."  MRI cervical spine done 03/18/14: "Relative to prior MRI from 10/20/2009, no change in patchy foci of increased T2 signal in the dorsal cord at C2- C4 in keeping with demyelinating lesions in this patient with a clinical history of multiple sclerosis.  No evidence of enhancement to suggest active demyelination. Moderately advanced degenerative disease at C5-C6 and C6-C7 with interval development of left eccentric central, foraminal entry zone and foraminal disc extrusion at C6-C7 exerting mass effect on the exiting C7 and descending C8 roots.  PAST MEDICAL HISTORY: Past Medical History  Diagnosis Date  . Headache(784.0)     migraines  . Multiple sclerosis ([place])   . Asthma as a child  . GERD (gastroesophageal reflux disease)   . Urinary urgency   . Anxiety   . Perforated bowel ([place]) 2009  . Migraines   . Depression   . Chronic back pain   . Chronic pain   . Ovarian cyst   . Panic attack   . Pseudoseizures   . Conversion disorder   . JC virus antibody positive   . PTSD (post-traumatic stress disorder)   . HTN (hypertension) 02/27/2015    PAST SURGICAL HISTORY: Past Surgical History  Procedure Laterality Date  . Extremity cyst excision  1994    right leg  . 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 Colostomy closure  04/2007  . Scar revision  01/21/2011    Procedure: SCAR REVISION;  Surgeon: Hermelinda Dellen;  Location: [place];  Service: Plastics;  Laterality: N/A;  exploration of scar of abdomen and repair of defect  . Hernia repair    . Abdominal surgery    . Appendectomy        MEDICATIONS: Current Outpatient Prescriptions on File Prior to Visit  Medication Sig Dispense Refill  . albuterol (PROVENTIL HFA;VENTOLIN HFA) 108 (90 Base) MCG/ACT inhaler Inhale 2 puffs into the lungs every 6 (six) hours as needed for wheezing or shortness of breath. 1 Inhaler 0  . cyclobenzaprine (FLEXERIL) 10 MG tablet Take 1 tablet (10 mg total) by mouth 3 (three) times daily as needed for muscle spasms. 30 tablet 2  . FLUoxetine (PROZAC) 20 MG tablet Take 1 tablet (20 mg total) by mouth daily. 30 tablet 3  . folic acid (FOLVITE) 1 MG tablet Take 3 tablets (3 mg total) by mouth daily. 90 tablet 5  . hydrOXYzine (ATARAX/VISTARIL) 25 MG tablet Take 1 tablet (25 mg total) by mouth 3 (three) times daily as needed for anxiety. 30 tablet 2  . labetalol (NORMODYNE) 200 MG tablet Take 1 tablet (200 mg total) by mouth 2 (two) times daily. 30 tablet 0  . Prenatal Vit-Fe Fumarate-FA (PRENATAL VITAMINS) 28-0.8 MG TABS Take 1 tablet by mouth daily. 30 tablet 5  . promethazine (PHENERGAN) 25 MG tablet Take 0.5-1 tablets (12.5-25 mg total) by mouth  every 6 (six) hours as needed for nausea. 30 tablet 2  . ranitidine (ZANTAC) 300 MG capsule Take 300 mg by mouth at bedtime.     . SUMAtriptan (IMITREX) 100 MG tablet Take 1 tablet (100 mg total) by mouth every 2 (two) hours as needed for migraine. May repeat in 2 hours if headache persists or recurs. 10 tablet 2  . EPINEPHrine (EPIPEN) 0.3 mg/0.3 mL SOAJ injection Inject 0.3 mg into the muscle as needed (allergic reaction). Reported on 03/28/2015    . Fingolimod HCl (GILENYA) 0.5 MG CAPS Take 0.5 mg by mouth daily. (Not taking Reported on 03/28/2015)     Current Facility-Administered Medications on File Prior to Visit  Medication Dose Route Frequency Provider Last Rate Last Dose  . dexamethasone (DECADRON) injection 10 mg  10 mg Intravenous Once Milton Ferguson, MD      . diphenhydrAMINE (BENADRYL) injection 50 mg  50 mg Intravenous Once Milton Ferguson, MD       . HYDROmorphone (DILAUDID) injection 1 mg  1 mg Intravenous Once Milton Ferguson, MD      . metoCLOPramide (REGLAN) injection 10 mg  10 mg Intravenous Once Milton Ferguson, MD        ALLERGIES: Allergies  Allergen Reactions  . Amitriptyline Hypertension and Other (See Comments)    hypertension  . Baclofen Hives and Shortness Of Breath  . Cymbalta [Duloxetine Hcl] Shortness Of Breath and Rash  . Gabapentin Shortness Of Breath and Rash  . Monosodium Glutamate Anaphylaxis  . Other Shortness Of Breath and Rash    MSG, beans (vomiting)  . Vicodin [Hydrocodone-Acetaminophen] Hives and Nausea And Vomiting    Projectile vomiting  . Alprazolam Other (See Comments)    Lethargy  . Magnesium Salicylate Hives and Itching  . Rizatriptan Nausea And Vomiting and Other (See Comments)    GI upset, Projectile vomiting  . Tizanidine Hives  . Toradol [Ketorolac Tromethamine] Nausea Only  . Tramadol Other (See Comments)  . Adhesive [Tape] Other (See Comments)    Skin irritation  . Lamotrigine Rash    FAMILY HISTORY: Family History  Problem Relation Age of Onset  . Diabetes Mother   . Hypertension Mother   . Diabetes Father   . Hypertension Father   . Arthritis Father   . Cancer Maternal Grandmother   . Cancer Maternal Grandfather     SOCIAL HISTORY: Social History   Social History  . Marital Status: Single    Spouse Name: N/A  . Number of Children: 0  . Years of Education: College   Occupational History  .  Other    disability   Social History Main Topics  . Smoking status: Current Every Day Smoker -- 0.50 packs/day for 10 years    Types: Cigarettes  . Smokeless tobacco: Never Used  . Alcohol Use: No     Comment: Rare  . Drug Use: No  . Sexual Activity: Yes    Birth Control/ Protection: None, Condom   Other Topics Concern  . Not on file   Social History Narrative   Patient lives at home with fiance.   Caffeine Use: 1 20oz soda daily    REVIEW OF  SYSTEMS: Constitutional: No fevers, chills, or sweats, no generalized fatigue, change in appetite Eyes: No visual changes, double vision, eye pain Ear, nose and throat: No hearing loss, ear pain, nasal congestion, sore throat Cardiovascular: No chest pain, palpitations Respiratory:  No shortness of breath at rest or with exertion, wheezes GastrointestinaI: No nausea, vomiting,  diarrhea, abdominal pain, fecal incontinence Genitourinary:  No dysuria, urinary retention or frequency Musculoskeletal:  + neck pain, back pain Integumentary: No rash, pruritus, skin lesions Neurological: as above Psychiatric: + depression, insomnia, anxiety Endocrine: No palpitations, fatigue, diaphoresis, mood swings, change in appetite, change in weight, increased thirst Hematologic/Lymphatic:  No anemia, purpura, petechiae. Allergic/Immunologic: no itchy/runny eyes, nasal congestion, recent allergic reactions, rashes  PHYSICAL EXAM: Filed Vitals:   03/28/15 0842  BP: 112/88  Pulse: 100   General: No acute distress, diaphoretic Head:  Normocephalic/atraumatic Eyes: Fundoscopic exam shows bilateral sharp discs, no vessel changes, exudates, or hemorrhages Neck: supple, no paraspinal tenderness, full range of motion Back: No paraspinal tenderness Heart: regular rate and rhythm Lungs: Clear to auscultation bilaterally. Vascular: No carotid bruits. Skin/Extremities: No rash, no edema Neurological Exam: Mental status: alert and oriented to person, place, and time, no dysarthria or aphasia, Fund of knowledge is appropriate.  Recent and remote memory are intact.  Attention and concentration are normal.    Able to name objects and repeat phrases. Cranial nerves: CN I: not tested CN II: pupils equal, round and reactive to light, visual fields intact, fundi unremarkable. CN III, IV, VI:  full range of motion, no nystagmus, no ptosis CN V: facial sensation intact CN VII: upper and lower face symmetric CN VIII:  hearing intact to finger rub CN IX, X: gag intact, uvula midline CN XI: sternocleidomastoid and trapezius muscles intact CN XII: tongue midline Bulk & Tone: normal, no fasciculations. Motor: 5/5 throughout with no pronator drift. Sensation: intact to light touch, cold, pin, vibration and joint position sense.  No extinction to double simultaneous stimulation.  Romberg test negative Deep Tendon Reflexes: +2 throughout, no ankle clonus Plantar responses: downgoing bilaterally Cerebellar: no incoordination on finger to nose, heel to shin. No dysdiadochokinesia Gait: narrow-based and steady, able to tandem walk adequately. Tremor: none  IMPRESSION: This is a 35 year old right-handed woman with a history of relapsing-remitting multiple sclerosis, headaches, anxiety with panic attacks, conversion disorder with psychogenic seizures, presenting to establish care. She is now [redacted] weeks pregnant and her OB has asked her to see neurology to determine if diazepam for anxiety/seizures would be a medication she can use, understanding this is pregnancy category D versus risks of injuring herself from falls when she has the psychogenic seizures. 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 We discussed the need for Behavioral Medicine (psychiatry and psychotherapy) for treatment of anxiety/panic attacks that lead to the PNES. We discussed pregnancy category D for diazepam, there is positive evidence for human fetal risk, but the benefits may be acceptable despite the risks if the drug is needed for condition where safer drugs cannot be used. She is already on fluoxetine and buspirone and continues to have daily anxiety attacks. She reports 3 PNES with falls in the past 2 months where she has injured herself. She accepts the risks, we discussed that it should only be used sparingly and I can write prescription for her at this time for Diazepam 5mg   prn (5 tablets a month), then once she is established with Psychiatry, recommend further treatment as per their recommendations. She expressed understanding. She does not drive. We also discussed MS treatment, agree with stopping medication during pregnancy, she will need to restart Gilenya after delivery. Migraines overall tolerable even off Topamax and Depakote. She takes prn Imitrex. She will follow-up in 6 months and knows to call for any changes.  Thank you for allowing me to participate in the care of this patient. Please do not hesitate to call for any questions or concerns.   Ellouise Newer, M.D.  CC: Dr. Mora Bellman

## 2015-03-28 NOTE — Telephone Encounter (Signed)
Patient called wanting to know if her prescription had been called in or a time frame. She said she uses [facility] on [address]. Her contact number is [phone]. She did give permission for you to speak to [facility] if he answers. Thank you

## 2015-03-28 NOTE — Telephone Encounter (Signed)
Rx has been faxed to pharmacy. Patient was notified.

## 2015-03-28 NOTE — Patient Instructions (Signed)
1. May take Diazepam 5mg  sparingly for conversion disorder seizures (5 a month). Once you are established with psychiatry, we will have them take over this medication and other medications for treatment of anxiety and panic attacks 2. Refer to Behavioral Medicine for psychiatry and therapy for conversion disorder and panic attacks/anxiety 3. No driving 4. Follow-up in 6 months, call for any changes 5. Good luck with your pregnancy!

## 2015-03-31 ENCOUNTER — Other Ambulatory Visit (HOSPITAL_COMMUNITY): Payer: Self-pay

## 2015-04-03 ENCOUNTER — Telehealth: Payer: Self-pay | Admitting: Neurology

## 2015-04-03 ENCOUNTER — Telehealth (HOSPITAL_COMMUNITY): Payer: Self-pay

## 2015-04-03 ENCOUNTER — Ambulatory Visit (INDEPENDENT_AMBULATORY_CARE_PROVIDER_SITE_OTHER): Payer: Medicare Other | Admitting: Family Medicine

## 2015-04-03 VITALS — BP 112/76 | HR 88 | Temp 98.2°F | Wt 230.3 lb

## 2015-04-03 DIAGNOSIS — G35 Multiple sclerosis: Secondary | ICD-10-CM | POA: Diagnosis not present

## 2015-04-03 DIAGNOSIS — IMO0002 Reserved for concepts with insufficient information to code with codable children: Secondary | ICD-10-CM

## 2015-04-03 DIAGNOSIS — O99212 Obesity complicating pregnancy, second trimester: Secondary | ICD-10-CM

## 2015-04-03 DIAGNOSIS — O99332 Smoking (tobacco) complicating pregnancy, second trimester: Secondary | ICD-10-CM

## 2015-04-03 DIAGNOSIS — E669 Obesity, unspecified: Secondary | ICD-10-CM

## 2015-04-03 DIAGNOSIS — O0992 Supervision of high risk pregnancy, unspecified, second trimester: Secondary | ICD-10-CM

## 2015-04-03 DIAGNOSIS — O99352 Diseases of the nervous system complicating pregnancy, second trimester: Secondary | ICD-10-CM | POA: Diagnosis not present

## 2015-04-03 DIAGNOSIS — Z0489 Encounter for examination and observation for other specified reasons: Secondary | ICD-10-CM

## 2015-04-03 DIAGNOSIS — G40909 Epilepsy, unspecified, not intractable, without status epilepticus: Secondary | ICD-10-CM

## 2015-04-03 DIAGNOSIS — O10912 Unspecified pre-existing hypertension complicating pregnancy, second trimester: Secondary | ICD-10-CM

## 2015-04-03 DIAGNOSIS — O10913 Unspecified pre-existing hypertension complicating pregnancy, third trimester: Secondary | ICD-10-CM

## 2015-04-03 DIAGNOSIS — O09512 Supervision of elderly primigravida, second trimester: Secondary | ICD-10-CM

## 2015-04-03 LAB — POCT URINALYSIS DIP (DEVICE)
Bilirubin Urine: NEGATIVE
GLUCOSE, UA: NEGATIVE mg/dL
KETONES UR: NEGATIVE mg/dL
NITRITE: NEGATIVE
PH: 6.5 (ref 5.0–8.0)
PROTEIN: 30 mg/dL — AB
Specific Gravity, Urine: 1.025 (ref 1.005–1.030)
Urobilinogen, UA: 1 mg/dL (ref 0.0–1.0)

## 2015-04-03 MED ORDER — ASPIRIN 81 MG PO TABS: 81.0000 mg | ORAL_TABLET | Freq: Every day | ORAL | Status: DC

## 2015-04-03 NOTE — Progress Notes (Signed)
OB fellow attestation:  I have seen and examined this patient; I agree with above documentation in the resident's note.   Caren Macadam, MD 1:33 PM

## 2015-04-03 NOTE — Telephone Encounter (Signed)
Noted, thanks!

## 2015-04-03 NOTE — Progress Notes (Signed)
Subjective:  Emily [name] is a 35 y.o. G1P0 at [redacted]w[redacted]d being seen today for ongoing prenatal care.  She is currently monitored for the following issues for this high-risk pregnancy and has Supervision of high-risk pregnancy; Anxiety; Depression; Multiple sclerosis ([place]); Migraine; Seizure disorder ([place]); Smoker; Obesity; Obesity complicating pregnancy, childbirth, or puerperium, antepartum; Tobacco smoking affecting pregnancy, antepartum; AMA (advanced maternal age) primigravida 67+; HTN (hypertension); Preexisting hypertension complicating pregnancy, antepartum; Perforated bowel ([place]); Substance abuse affecting pregnancy, antepartum; Advanced maternal age, 1st pregnancy; Conversion disorder with attacks or seizures, persistent, with psychological stressor; Migraine with aura and without status migrainosus, not intractable; Generalized anxiety disorder; and 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  Currently smoking 4 cigs a day down from 1ppd prior to pregnancy.   Overall MS has gotten better with pregnancy. Still trying to get her anxiety and depression under control; has future appointment with psychiatrist.   Patient reports no complaints.  Contractions: Not present. Vag. Bleeding: None.  Movement: Present. Denies leaking of fluid.   The following portions of the patient's history were reviewed and updated as appropriate: allergies, current medications, past family history, past medical history, past social history, past surgical history and problem list. Problem list updated.  Objective:   Filed Vitals:   04/03/15 1055  BP: 112/76  Pulse: 88  Temp: 98.2 F (36.8 C)  Weight: 230 lb 4.8 oz (104.463 kg)    Fetal Status: Fetal Heart Rate (bpm): 155 Fundal Height: 20 cm Movement: Present     General:  Alert, oriented and cooperative. Patient is in no acute distress.  Skin: Skin is warm and dry. No rash noted.   Cardiovascular: Normal heart rate noted   Respiratory: Normal respiratory effort, no problems with respiration noted  Abdomen: Soft, gravid, appropriate for gestational age. Pain/Pressure: Absent     Pelvic: Vag. Bleeding: None     Cervical exam deferred        Extremities: Normal range of motion.  Edema: None  Mental Status: Normal mood and affect. Normal behavior. Normal judgment and thought content.   Urinalysis:      Assessment and Plan:  Pregnancy: G1P0 at [redacted]w[redacted]d  1. Preexisting hypertension complicating pregnancy, antepartum, third trimester Currently taking Labetalol 200mg  BID. BP well-controlled today. Discussed Pre-eclampsia signs/syymptoms. Start 81mg  ASA. Patient given 24hr urine to collect.   2. Multiple sclerosis ([place]) Improved with pregnancy. Following with a Neurologist.   3. Seizure disorder ([place]) Follows with neurology. No recent seizures. Seizures are induced by anxiety (conversion disorder). Has psychiatrist appointment scheduled for May.  4. AMA (advanced maternal age) primigravida 91+, second trimester  5. Obesity complicating pregnancy, childbirth, or puerperium, antepartum, second trimester Monitor weight gain. Continue to counsel.   6. Supervision of high-risk pregnancy, second trimester Routine prenatal care.   7. Tobacco smoking affecting pregnancy, antepartum, second trimester Has decreased tobacco use. Encouraged complete smoking cessation.   Preterm labor symptoms and general obstetric precautions including but not limited to vaginal bleeding, contractions, leaking of fluid and fetal movement were reviewed in detail with the patient. Please refer to After Visit Summary for other counseling recommendations.  Return in about 4 weeks (around 05/01/2015) for Routine prenatal care.   Katheren Shams, DO PGY-2

## 2015-04-03 NOTE — Patient Instructions (Signed)
Second Trimester of Pregnancy  The second trimester is from week 13 through week 28, month 4 through 6. This is often the time in pregnancy that you feel your best. Often times, morning sickness has lessened or quit. You may have more energy, and you may get hungry more often. Your unborn baby (fetus) is growing rapidly. At the end of the sixth month, he or she is about 9 inches long and weighs about 1½ pounds. You will likely feel the baby move (quickening) between 18 and 20 weeks of pregnancy.  HOME CARE   · Avoid all smoking, herbs, and alcohol. Avoid drugs not approved by your doctor.  · Do not use any tobacco products, including cigarettes, chewing tobacco, and electronic cigarettes. If you need help quitting, ask your doctor. You may get counseling or other support to help you quit.  · Only take medicine as told by your doctor. Some medicines are safe and some are not during pregnancy.  · Exercise only as told by your doctor. Stop exercising if you start having cramps.  · Eat regular, healthy meals.  · Wear a good support bra if your breasts are tender.  · Do not use hot tubs, steam rooms, or saunas.  · Wear your seat belt when driving.  · Avoid raw meat, uncooked cheese, and liter boxes and soil used by cats.  · Take your prenatal vitamins.  · Take 1500-2000 milligrams of calcium daily starting at the 20th week of pregnancy until you deliver your baby.  · Try taking medicine that helps you poop (stool softener) as needed, and if your doctor approves. Eat more fiber by eating fresh fruit, vegetables, and whole grains. Drink enough fluids to keep your pee (urine) clear or pale yellow.  · Take warm water baths (sitz baths) to soothe pain or discomfort caused by hemorrhoids. Use hemorrhoid cream if your doctor approves.  · If you have puffy, bulging veins (varicose veins), wear support hose. Raise (elevate) your feet for 15 minutes, 3-4 times a day. Limit salt in your diet.  · Avoid heavy lifting, wear low heals,  and sit up straight.  · Rest with your legs raised if you have leg cramps or low back pain.  · Visit your dentist if you have not gone during your pregnancy. Use a soft toothbrush to brush your teeth. Be gentle when you floss.  · You can have sex (intercourse) unless your doctor tells you not to.  · Go to your doctor visits.  GET HELP IF:   · You feel dizzy.  · You have mild cramps or pressure in your lower belly (abdomen).  · You have a nagging pain in your belly area.  · You continue to feel sick to your stomach (nauseous), throw up (vomit), or have watery poop (diarrhea).  · You have bad smelling fluid coming from your vagina.  · You have pain with peeing (urination).  GET HELP RIGHT AWAY IF:   · You have a fever.  · You are leaking fluid from your vagina.  · You have spotting or bleeding from your vagina.  · You have severe belly cramping or pain.  · You lose or gain weight rapidly.  · You have trouble catching your breath and have chest pain.  · You notice sudden or extreme puffiness (swelling) of your face, hands, ankles, feet, or legs.  · You have not felt the baby move in over an hour.  · You have severe headaches that do   not go away with medicine.  · You have vision changes.     This information is not intended to replace advice given to you by your health care provider. Make sure you discuss any questions you have with your health care provider.     Document Released: 03/20/2009 Document Revised: 01/14/2014 Document Reviewed: 02/25/2012  Elsevier Interactive Patient Education ©2016 [employer] Inc[employer]

## 2015-04-03 NOTE — Telephone Encounter (Signed)
PT called and said she made an appointment with the psychiatrist on May 16, 2015 with  Dr Agarwal/Dawn CB# [phone]

## 2015-04-03 NOTE — Telephone Encounter (Signed)
FYI

## 2015-04-03 NOTE — Telephone Encounter (Signed)
Called  Mercedies L Heinicke partner, Legrand Como, to discuss her prenatal cell free DNA test results. Ms. Spackman requested that I discuss results with her partner. Ms. Shellye Kloepfer Parkey had Panorama testing through [facility].  Testing was offered because of a maternal age of 32. The patient was identified by name and DOB.  We reviewed that these are within normal limits, showing a less than 1 in 10,000 risk for trisomies 21, 18 and 13, and monosomy X (Turner syndrome). In addition, the risk for triploidy/vanishing twin and sex chromosome trisomies (47,XXX and 47,XXY) was also low risk. We reviewed that this testing identifies > 99% of pregnancies with trisomy 75, trisomy 80, sex chromosome trisomies (47,XXX and 47,XXY), and triploidy. The detection rate for trisomy 18 is 96%. The detection rate for monosomy X is ~92%. The false positive rate is <0.1% for all conditions. Testing was also consistent with female fetal sex. The patient did not wish to know fetal sex. She understands that this testing does not identify all genetic conditions. All questions were answered to her satisfaction, she was encouraged to call with additional questions or concerns.  Sharyne Richters, MS Certified Genetic Counselor

## 2015-04-10 ENCOUNTER — Other Ambulatory Visit: Payer: Medicare Other

## 2015-04-10 ENCOUNTER — Other Ambulatory Visit: Payer: Self-pay | Admitting: Family Medicine

## 2015-04-10 DIAGNOSIS — O10912 Unspecified pre-existing hypertension complicating pregnancy, second trimester: Secondary | ICD-10-CM

## 2015-04-10 LAB — COMPREHENSIVE METABOLIC PANEL
ALK PHOS: 77 U/L (ref 33–115)
ALT: 7 U/L (ref 6–29)
AST: 10 U/L (ref 10–30)
Albumin: 3.3 g/dL — ABNORMAL LOW (ref 3.6–5.1)
BILIRUBIN TOTAL: 0.2 mg/dL (ref 0.2–1.2)
BUN: 6 mg/dL — ABNORMAL LOW (ref 7–25)
CALCIUM: 8.4 mg/dL — AB (ref 8.6–10.2)
CO2: 19 mmol/L — ABNORMAL LOW (ref 20–31)
CREATININE: 0.44 mg/dL — AB (ref 0.50–1.10)
Chloride: 106 mmol/L (ref 98–110)
GLUCOSE: 82 mg/dL (ref 65–99)
Potassium: 3.8 mmol/L (ref 3.5–5.3)
SODIUM: 137 mmol/L (ref 135–146)
Total Protein: 6.1 g/dL (ref 6.1–8.1)

## 2015-04-10 NOTE — Addendum Note (Signed)
Addended by: Novella Olive on: 04/10/2015 12:37 PM   Modules accepted: Orders

## 2015-04-10 NOTE — Addendum Note (Signed)
Addended by: Novella Olive on: 04/10/2015 12:35 PM   Modules accepted: Orders

## 2015-04-10 NOTE — Addendum Note (Signed)
Addended by: Caryl Bis on: 04/10/2015 12:09 PM   Modules accepted: Orders

## 2015-04-12 LAB — CREATININE CLEARANCE, URINE, 24 HOUR
CREATININE, URINE: 117 mg/dL (ref 20–320)
CREATININE: 0.44 mg/dL — AB (ref 0.50–1.10)
Creatinine Clearance: 282 mL/min — ABNORMAL HIGH (ref 75–115)
Creatinine, 24H Ur: 1.78 g/(24.h) (ref 0.63–2.50)

## 2015-04-12 LAB — PROTEIN, URINE, 24 HOUR
Protein, 24H Urine: 214 mg/24 h — ABNORMAL HIGH (ref <150)
Protein, Urine: 14 mg/dL (ref 5–24)

## 2015-04-14 ENCOUNTER — Ambulatory Visit: Payer: Self-pay

## 2015-04-17 ENCOUNTER — Ambulatory Visit (INDEPENDENT_AMBULATORY_CARE_PROVIDER_SITE_OTHER): Payer: Medicare Other | Admitting: Obstetrics & Gynecology

## 2015-04-17 VITALS — BP 124/90 | HR 86 | Wt 233.0 lb

## 2015-04-17 DIAGNOSIS — O2242 Hemorrhoids in pregnancy, second trimester: Secondary | ICD-10-CM

## 2015-04-17 DIAGNOSIS — N390 Urinary tract infection, site not specified: Secondary | ICD-10-CM | POA: Diagnosis not present

## 2015-04-17 DIAGNOSIS — O99342 Other mental disorders complicating pregnancy, second trimester: Secondary | ICD-10-CM

## 2015-04-17 DIAGNOSIS — O2342 Unspecified infection of urinary tract in pregnancy, second trimester: Secondary | ICD-10-CM | POA: Diagnosis not present

## 2015-04-17 DIAGNOSIS — O0992 Supervision of high risk pregnancy, unspecified, second trimester: Secondary | ICD-10-CM

## 2015-04-17 DIAGNOSIS — G43109 Migraine with aura, not intractable, without status migrainosus: Secondary | ICD-10-CM

## 2015-04-17 DIAGNOSIS — F419 Anxiety disorder, unspecified: Secondary | ICD-10-CM

## 2015-04-17 DIAGNOSIS — O10912 Unspecified pre-existing hypertension complicating pregnancy, second trimester: Secondary | ICD-10-CM

## 2015-04-17 LAB — POCT URINALYSIS DIP (DEVICE)
BILIRUBIN URINE: NEGATIVE
GLUCOSE, UA: NEGATIVE mg/dL
KETONES UR: NEGATIVE mg/dL
NITRITE: POSITIVE — AB
PH: 5.5 (ref 5.0–8.0)
Protein, ur: NEGATIVE mg/dL
Specific Gravity, Urine: 1.025 (ref 1.005–1.030)
Urobilinogen, UA: 0.2 mg/dL (ref 0.0–1.0)

## 2015-04-17 MED ORDER — CEPHALEXIN 500 MG PO CAPS: 500.0000 mg | ORAL_CAPSULE | Freq: Four times a day (QID) | ORAL | Status: DC

## 2015-04-17 MED ORDER — HYDROXYZINE PAMOATE 25 MG PO CAPS: 25.0000 mg | ORAL_CAPSULE | Freq: Three times a day (TID) | ORAL | Status: DC | PRN

## 2015-04-17 MED ORDER — HYDROCORTISONE ACETATE 25 MG RE SUPP: 25.0000 mg | Freq: Two times a day (BID) | RECTAL | Status: DC

## 2015-04-17 NOTE — Progress Notes (Signed)
Subjective:  Emily [name] is a 35 y.o. G1P0 at [redacted]w[redacted]d being seen today for ongoing prenatal care.  She is currently monitored for the following issues for this high-risk pregnancy and has Supervision of high-risk pregnancy; Anxiety; Depression; Multiple sclerosis ([place]); Migraine; Seizure disorder ([place]); Smoker; Obesity; Obesity complicating pregnancy, childbirth, or puerperium, antepartum; Tobacco smoking affecting pregnancy, antepartum; AMA (advanced maternal age) primigravida 61+; HTN (hypertension); Preexisting hypertension complicating pregnancy, antepartum; Perforated bowel ([place]); Substance abuse affecting pregnancy, antepartum; Advanced maternal age, 1st pregnancy; Conversion disorder with attacks or seizures, persistent, with psychological stressor; Migraine with aura and without status migrainosus, not intractable; Generalized anxiety disorder; and 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  Patient reports migraines, worsening vision in R eye. Has history of optic neuritis.  Also feels she has UTI, passed kidney stone this weekend and always has UTIs and kidney stones concurrently. Also has some bleeding from hemorrhoids, wants treatment.  Contractions: Not present. Vag. Bleeding: None.  Movement: Present. Denies leaking of fluid.   The following portions of the patient's history were reviewed and updated as appropriate: allergies, current medications, past family history, past medical history, past social history, past surgical history and problem list. Problem list updated.  Objective:   Filed Vitals:   04/17/15 0908  BP: 124/90  Pulse: 86  Weight: 233 lb (105.688 kg)    Fetal Status: Fetal Heart Rate (bpm): 150 Fundal Height: 23 cm Movement: Present     General:  Alert, oriented and cooperative. Patient is in no acute distress.  Skin: Skin is warm and dry. No rash noted.   Cardiovascular: Normal heart rate noted  Respiratory: Normal respiratory  effort, no problems with respiration noted  Abdomen: Soft, gravid, appropriate for gestational age. Pain/Pressure: Absent     Pelvic: Vag. Bleeding: None     Cervical exam deferred        Extremities: Normal range of motion.  Edema: None  Mental Status: Normal mood and affect. Normal behavior. Normal judgment and thought content.   Urinalysis: Urine Protein: Trace Urine Glucose: Negative Results for orders placed or performed in visit on 04/17/15 (from the past 24 hour(s))  POCT urinalysis dip (device)     Status: Abnormal   Collection Time: 04/17/15  9:21 AM  Result Value Ref Range   Glucose, UA NEGATIVE NEGATIVE mg/dL   Bilirubin Urine NEGATIVE NEGATIVE   Ketones, ur NEGATIVE NEGATIVE mg/dL   Specific Gravity, Urine 1.025 1.005 - 1.030   Hgb urine dipstick MODERATE (A) NEGATIVE   pH 5.5 5.0 - 8.0   Protein, ur NEGATIVE NEGATIVE mg/dL   Urobilinogen, UA 0.2 0.0 - 1.0 mg/dL   Nitrite POSITIVE (A) NEGATIVE   Leukocytes, UA TRACE (A) NEGATIVE    Assessment and Plan:  Pregnancy: G1P0 at [redacted]w[redacted]d  1. Preexisting hypertension complicating pregnancy, antepartum, second trimester Stable BP, DBP 90. Continue Labetalol.  Growth scan on 04/18/15.  2. Migraine with aura and without status migrainosus, not intractable Will follow up with neurologist ASAP.  May need steriods, this is safe in pregnancy.   3. Anxiety during pregnancy, antepartum, second trimester Atarax refilled. - hydrOXYzine (VISTARIL) 25 MG capsule; Take 1 capsule (25 mg total) by mouth 3 (three) times daily as needed.  Dispense: 30 capsule; Refill: 7  4. UTI in pregnancy, antepartum, second trimester Keflex prescribed, will follow up culture - Culture, OB Urine - cephALEXin (KEFLEX) 500 MG capsule; Take 1 capsule (500 mg total) by mouth 4 (four) times daily.  Dispense: 28 capsule; Refill: 2  5. Hemorrhoids during pregnancy in second trimester Suppositories prescribed as per patient request. - hydrocortisone (ANUSOL-HC)  25 MG suppository; Place 1 suppository (25 mg total) rectally 2 (two) times daily.  Dispense: 12 suppository; Refill: 1  6. Supervision of high-risk pregnancy, second trimester Preterm labor symptoms and general obstetric precautions including but not limited to vaginal bleeding, contractions, leaking of fluid and fetal movement were reviewed in detail with the patient. Please refer to After Visit Summary for other counseling recommendations.  Return in about 2 weeks (around 05/01/2015).   Osborne Oman, MD

## 2015-04-17 NOTE — Patient Instructions (Signed)
AREA PEDIATRIC/FAMILY PRACTICE PHYSICIANS  [facility] [address] [place], [place] [address] Phone - [phone]   Fax - [phone]  [facility] [address] [place], [place]  [address] Phone - [phone]   Fax - [phone]  [facility] [address] [name], [place]  [address] Phone - [phone]   Fax - [phone]  [facility] [address] [place], [place]  [address] Phone - [phone]   Fax - [phone]  [facility] [address] [place], [place]  [address] Phone - [phone]   Fax - [phone]  [facility] [address] [place], [place]  [address] Phone - [phone]   Fax - [phone]  [facility] [address] [place], [place]  [address] Phone - [phone]   Fax - [phone]  [facility] [address] [place], [place]  [address] Phone - [phone]   Fax - [phone]  [facility] [address] [place], [place]  [address] Phone - [phone]   Fax - [phone] [facility] [address] [place], [place]  [address] Phone - [phone]   Fax - [phone]  [facility] [address] [place], [place]  [address] Phone - [phone]   Fax - [phone]  [facility] [address] [place], [place]  [address] Phone - [phone]   Fax - [phone]  [facility] [address] [place], [place]  [address] Phone - [phone]   Fax - [phone]  [facility] [address] [place], [place]  [address] Phone - [phone]  [facility] [address] [place], [place]  [address] Phone - [phone]   Fax - [phone]  [facility] [address] [place], [place]  [address] Phone - [phone]   Fax - [phone]  [facility] [address] [place], [place]  [address] Phone - [phone]   Fax - [phone]  [facility] [address] [place], [place]  [address] Phone - [phone]   Fax - [phone]   [facility] [address] [place], [place]  [address] Phone - [phone]   Fax - [phone]  [facility] [address] [place], [place]  [address] Phone - [phone]   Fax - [phone]  [facility] [address] [place], [place]  [address] Phone - [phone]   Fax - [phone]  [facility] [address] , [place]  [address] Phone - [phone]   Fax - [phone]  [facility] [address] , [place]  [address] Phone - [phone]   Fax - [phone] [facility] [address] [place], [place]  [address] Phone - [phone]   Fax - [phone]  [facility] [address] [place], [place]  [address] Phone - [phone]   Fax - [phone]  [facility] [address] [place],   [address] Phone - [phone]   Fax - [phone]   

## 2015-04-17 NOTE — Progress Notes (Signed)
Pt reports increasing worsening vision in her right eye. She has worsening migraines. Also she passed a kidney stone over the weekend.

## 2015-04-18 ENCOUNTER — Other Ambulatory Visit (HOSPITAL_COMMUNITY): Payer: Self-pay | Admitting: Maternal and Fetal Medicine

## 2015-04-18 ENCOUNTER — Ambulatory Visit (HOSPITAL_COMMUNITY)
Admission: RE | Admit: 2015-04-18 | Discharge: 2015-04-18 | Disposition: A | Discharge status: Home / Self Care | Payer: Medicare Other | Source: Ambulatory Visit | Attending: Obstetrics and Gynecology | Admitting: Obstetrics and Gynecology

## 2015-04-18 ENCOUNTER — Encounter (HOSPITAL_COMMUNITY): Payer: Self-pay

## 2015-04-18 ENCOUNTER — Telehealth: Payer: Self-pay | Admitting: *Deleted

## 2015-04-18 ENCOUNTER — Telehealth: Payer: Self-pay | Admitting: Neurology

## 2015-04-18 VITALS — BP 114/82 | HR 83 | Wt 234.0 lb

## 2015-04-18 DIAGNOSIS — O99350 Diseases of the nervous system complicating pregnancy, unspecified trimester: Secondary | ICD-10-CM

## 2015-04-18 DIAGNOSIS — O99322 Drug use complicating pregnancy, second trimester: Secondary | ICD-10-CM | POA: Diagnosis not present

## 2015-04-18 DIAGNOSIS — O10012 Pre-existing essential hypertension complicating pregnancy, second trimester: Secondary | ICD-10-CM | POA: Insufficient documentation

## 2015-04-18 DIAGNOSIS — IMO0002 Reserved for concepts with insufficient information to code with codable children: Secondary | ICD-10-CM

## 2015-04-18 DIAGNOSIS — Z0489 Encounter for examination and observation for other specified reasons: Secondary | ICD-10-CM

## 2015-04-18 DIAGNOSIS — O99212 Obesity complicating pregnancy, second trimester: Secondary | ICD-10-CM

## 2015-04-18 DIAGNOSIS — O09522 Supervision of elderly multigravida, second trimester: Secondary | ICD-10-CM | POA: Diagnosis not present

## 2015-04-18 DIAGNOSIS — Z3A23 23 weeks gestation of pregnancy: Secondary | ICD-10-CM

## 2015-04-18 DIAGNOSIS — O162 Unspecified maternal hypertension, second trimester: Secondary | ICD-10-CM

## 2015-04-18 DIAGNOSIS — O99332 Smoking (tobacco) complicating pregnancy, second trimester: Secondary | ICD-10-CM

## 2015-04-18 DIAGNOSIS — O10912 Unspecified pre-existing hypertension complicating pregnancy, second trimester: Secondary | ICD-10-CM

## 2015-04-18 DIAGNOSIS — F1910 Other psychoactive substance abuse, uncomplicated: Secondary | ICD-10-CM

## 2015-04-18 DIAGNOSIS — G35 Multiple sclerosis: Secondary | ICD-10-CM

## 2015-04-18 DIAGNOSIS — O99352 Diseases of the nervous system complicating pregnancy, second trimester: Secondary | ICD-10-CM | POA: Insufficient documentation

## 2015-04-18 DIAGNOSIS — O99320 Drug use complicating pregnancy, unspecified trimester: Secondary | ICD-10-CM

## 2015-04-18 DIAGNOSIS — O09512 Supervision of elderly primigravida, second trimester: Secondary | ICD-10-CM

## 2015-04-18 DIAGNOSIS — O0992 Supervision of high risk pregnancy, unspecified, second trimester: Secondary | ICD-10-CM

## 2015-04-18 NOTE — Telephone Encounter (Signed)
Pt left message stating that her insurance will not cover the prescription given yesterday for hydroxyzine capsules. It will only cover tablets. She requests a new Rx. I called and spoke w/pharmacust @ [facility] and was explained that pt's insurance will not cover cost of either capsules or tablets however pt was able to pay out of pocket for hydroxyzine capsules. No further action is necessary at this time.

## 2015-04-18 NOTE — Telephone Encounter (Signed)
Left msg with patient's fiance' to return my call.

## 2015-04-18 NOTE — Telephone Encounter (Signed)
PT called and needs a call back in regards to her women's clinic appointment she had and they suggested she come in and see Dr Briant Sites CB# [phone]

## 2015-04-19 LAB — CULTURE, OB URINE

## 2015-04-20 ENCOUNTER — Other Ambulatory Visit (HOSPITAL_COMMUNITY): Payer: Self-pay | Admitting: *Deleted

## 2015-04-20 DIAGNOSIS — O10919 Unspecified pre-existing hypertension complicating pregnancy, unspecified trimester: Secondary | ICD-10-CM

## 2015-04-20 NOTE — Telephone Encounter (Signed)
Lmovm to rtn my call. 

## 2015-04-20 NOTE — Telephone Encounter (Signed)
Returned you call.  Please call her today, as it has something to do with her meds.

## 2015-04-20 NOTE — Telephone Encounter (Signed)
Patient returned my call. She was seen at the women's clinic and they advised her to call our office for some issues she has been having from her mirgraines & optic neuritis. Scheduled her an appt for 4/19.

## 2015-04-26 ENCOUNTER — Ambulatory Visit (INDEPENDENT_AMBULATORY_CARE_PROVIDER_SITE_OTHER): Payer: Medicare Other | Admitting: Neurology

## 2015-04-26 ENCOUNTER — Encounter: Payer: Self-pay | Admitting: Neurology

## 2015-04-26 VITALS — BP 120/88 | HR 103 | Ht 65.5 in | Wt 238.3 lb

## 2015-04-26 DIAGNOSIS — G35 Multiple sclerosis: Secondary | ICD-10-CM

## 2015-04-26 DIAGNOSIS — F411 Generalized anxiety disorder: Secondary | ICD-10-CM

## 2015-04-26 DIAGNOSIS — G43109 Migraine with aura, not intractable, without status migrainosus: Secondary | ICD-10-CM | POA: Diagnosis not present

## 2015-04-26 DIAGNOSIS — H469 Unspecified optic neuritis: Secondary | ICD-10-CM | POA: Diagnosis not present

## 2015-04-26 DIAGNOSIS — F4001 Agoraphobia with panic disorder: Secondary | ICD-10-CM

## 2015-04-26 DIAGNOSIS — F445 Conversion disorder with seizures or convulsions: Secondary | ICD-10-CM

## 2015-04-26 DIAGNOSIS — G35D Multiple sclerosis, unspecified: Secondary | ICD-10-CM

## 2015-04-26 MED ORDER — METHYLPREDNISOLONE SODIUM SUCC 1000 MG IJ SOLR: 1000.0000 mg | Freq: Every day | INTRAMUSCULAR | Status: DC

## 2015-04-26 NOTE — Progress Notes (Signed)
NEUROLOGY FOLLOW UP OFFICE NOTE  Emily [name] BD:[id number]  HISTORY OF PRESENT ILLNESS: I had the pleasure of seeing Emily [name] in follow-up in the neurology clinic on 04/26/2015.  The patient was last seen a month ago for conversion disorder with shaking spells. She is aware these episodes occur with panic attacks, and we had discussed risks and benefits of diazepam intake during pregnancy. She reports occasional shaking episodes, she almost had one last week but took Benadryl and hydroxyzine, and felt better. She only takes diazepam if she cannot calm down. She has a history of MS and had to stop Gilenya with pregnancy. She also has migraines and had been doing well with discontinuation of Depakote and Topamax during pregnancy, however presents today due to concern for right optic neuritis and worsening migraines. She has always had blurred vision in her right eye since first diagnosed with optic neuritis in 2008, but states that since 04/15/15, she can mostly see shapes but cannot read like she used to on the right eye. There is pain behind her right eye, sometimes it feels like it will pop out. This has triggered her migraines because she is taking more energy to focus. Left eye unaffected. She has some generalized weakness, no focal paresthesias. She reports bad migraines every 2 weeks, takes prn sumatriptan. No associated nausea/vomiting.   HPI 03/28/15: This is a 35 yo RH woman with a history of relapsing-remitting multiple sclerosis, headaches, anxiety with panic attacks, conversion disorder with seizures. She presented at [redacted] weeks pregnant to determine if diazepam for anxiety/seizures would be a medication she can use, understanding this is pregnancy category D versus risks of injuring herself from falls when she has the psychogenic seizures. Records from her previous neurologists at [place] were reviewed. She was diagnosed with MS in June 2008 while living in [place] state, she  started having right thigh pain and right eye blurred vision. She was diagnosed with right optic neuritis and underwent MRI brain and lumbar puncture which confirmed a diagnosis of MS. 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 She has been on Gilenya since 2014 without any relapse. This was discontinued in December 2016 when she found out she was pregnant. She has a history of chronic pain and migraines, and was taking Depakote and Topamax for headache prophylaxis, also stopped in December 2016. She reports having Botox for her migraines, but so far migraines are not as bad as they were.  She started having shaking episodes in April 2016. Initially Depakote dose was increased to 500mg  BID. She was back in May 2016 with multiple episodes of syncope, palpitations, a sensation of her heart jumping out of her chest. She also reported lightheadedness, facial numbness, and numbness in her hands and feet. On her last visit in June 2016 at [facility], shaking episodes were discussed. Events always occur after a stressful event. She had a spell in [place] where she had a normal EEG and was diagnosed with a conversion disorder. She had seen Dr. Darleene Cleaver and reportedly told her he could not help her because "her case was too complex." She started seeing a psychiatrist in [place], however since she switched neurologists, she reports that he cannot see her anymore. She has not been able to establish care with [facility] and continues to have anxiety and panic attacks "all the time." She is diaphoretic in  the office today and reports she always feels anxious. When she starts having one of her seizures, she would have significant anxiety, "my body can't deal and goes into a seizure," she hears her heartbeat in her ears. If it slowly builds up, she can get herself to a safe place,  however other times it "just hits me," and she falls to the ground and has injured herself. She reports falling out one time in the bathroom during a doctor visit. She is currently on Fluoxetine and Buspirone. Clonazepam in the past did not help. She would usually go to the ER for these spells, which would quiet down once she gets Benadryl and Diazepam. She reports having 3 episodes in the past 2 months.   Diagnostic Data:  I personally reviewed MRI brain with and without contrast done with [facility] last 10/02/13 which did not show any acute changes, there was multiple T2/FLAIR lesions in the subcortical and deep white matter regions, no abnormal enhancement. Hippocampi appear asymmetric, smaller on the right without abnormal signal or enhancement.  MRI C-spine with and without contrast 10/18/13 showed hazy T2 hyperintensity within the cord from C2-3 to C4-5 without abnormal enhancement.  per [facility] - MRI brain done 03/18/14: "No change in numerous multifocal T2 hyperintense white matter lesions relative to prior MRI from 04/17/2013 most consistent with demyelinating lesions given history of multiple sclerosis. No evidence of active demyelination."  MRI cervical spine done 03/18/14: "Relative to prior MRI from 10/20/2009, no change in patchy foci of increased T2 signal in the dorsal cord at C2- C4 in keeping with demyelinating lesions in this patient with a clinical history of multiple sclerosis.  No evidence of enhancement to suggest active demyelination. Moderately advanced degenerative disease at C5-C6 and C6-C7 with interval development of left eccentric central, foraminal entry zone and foraminal disc extrusion at C6-C7 exerting mass effect on the exiting C7 and descending C8 roots.  PAST MEDICAL HISTORY: Past Medical History  Diagnosis Date  . Headache(784.0)     migraines  . Multiple sclerosis ([place])   . Asthma as a child  . GERD (gastroesophageal reflux disease)   . Urinary urgency   . Anxiety    . Perforated bowel ([place]) 2009  . Migraines   . Depression   . Chronic back pain   . Chronic pain   . Ovarian cyst   . Panic attack   . Pseudoseizures   . Conversion disorder   . JC virus antibody positive   . PTSD (post-traumatic stress disorder)   . HTN (hypertension) 02/27/2015    MEDICATIONS: Current Outpatient Prescriptions on File Prior to Visit  Medication Sig Dispense Refill  . albuterol (PROVENTIL HFA;VENTOLIN HFA) 108 (90 Base) MCG/ACT inhaler Inhale 2 puffs into the lungs every 6 (six) hours as needed for wheezing or shortness of breath. 1 Inhaler 0  . aspirin 81 MG tablet Take 1 tablet (81 mg total) by mouth daily. 30 tablet 6  . busPIRone (BUSPAR) 5 MG tablet Take 5 mg by mouth 2 (two) times daily.    . cephALEXin (KEFLEX) 500 MG capsule Take 1 capsule (500 mg total) by mouth 4 (four) times daily. 28 capsule 2  . cyclobenzaprine (FLEXERIL) 10 MG tablet Take 1 tablet (10 mg total) by mouth 3 (three) times daily as needed for muscle spasms. 30 tablet 2  . diazepam (VALIUM) 5 MG tablet Take 1 tablet as needed for panic attacks leading to seizures. Do not take more than 5 a month (Patient taking  differently: Take 5 mg by mouth. Take 1 tablet as needed for panic attacks leading to seizures. Do not take more than 5 a month) 5 tablet 5  . EPINEPHrine (EPIPEN) 0.3 mg/0.3 mL SOAJ injection Inject 0.3 mg into the muscle as needed (allergic reaction). Reported on 03/28/2015    . Fingolimod HCl (GILENYA) 0.5 MG CAPS Take 0.5 mg by mouth daily. Reported on 03/28/2015    . FLUoxetine (PROZAC) 20 MG tablet Take 1 tablet (20 mg total) by mouth daily. 30 tablet 3  . folic acid (FOLVITE) 1 MG tablet Take 3 tablets (3 mg total) by mouth daily. 90 tablet 5  . hydrocortisone (ANUSOL-HC) 25 MG suppository Place 1 suppository (25 mg total) rectally 2 (two) times daily. 12 suppository 1  . hydrOXYzine (ATARAX/VISTARIL) 25 MG tablet Take 1 tablet (25 mg total) by mouth 3 (three) times daily as needed  for anxiety. 30 tablet 2  . hydrOXYzine (VISTARIL) 25 MG capsule Take 1 capsule (25 mg total) by mouth 3 (three) times daily as needed. 30 capsule 7  . labetalol (NORMODYNE) 200 MG tablet Take 1 tablet (200 mg total) by mouth 2 (two) times daily. 30 tablet 0  . Multiple Vitamins-Minerals (MULTIVITAMIN ADULT PO) Take by mouth.    . Prenatal Vit-Fe Fumarate-FA (PRENATAL VITAMINS) 28-0.8 MG TABS Take 1 tablet by mouth daily. 30 tablet 5  . promethazine (PHENERGAN) 25 MG tablet Take 0.5-1 tablets (12.5-25 mg total) by mouth every 6 (six) hours as needed for nausea. 30 tablet 2  . ranitidine (ZANTAC) 300 MG capsule Take 300 mg by mouth at bedtime.     . SUMAtriptan (IMITREX) 100 MG tablet Take 1 tablet (100 mg total) by mouth every 2 (two) hours as needed for migraine. May repeat in 2 hours if headache persists or recurs. 10 tablet 2   Current Facility-Administered Medications on File Prior to Visit  Medication Dose Route Frequency Provider Last Rate Last Dose  . dexamethasone (DECADRON) injection 10 mg  10 mg Intravenous Once Milton Ferguson, MD      . diphenhydrAMINE (BENADRYL) injection 50 mg  50 mg Intravenous Once Milton Ferguson, MD      . HYDROmorphone (DILAUDID) injection 1 mg  1 mg Intravenous Once Milton Ferguson, MD      . metoCLOPramide (REGLAN) injection 10 mg  10 mg Intravenous Once Milton Ferguson, MD        ALLERGIES: Allergies  Allergen Reactions  . Amitriptyline Hypertension and Other (See Comments)    hypertension  . Baclofen Hives and Shortness Of Breath  . Cymbalta [Duloxetine Hcl] Shortness Of Breath and Rash  . Gabapentin Shortness Of Breath and Rash  . Monosodium Glutamate Anaphylaxis  . Other Shortness Of Breath and Rash    MSG, beans (vomiting)  . Vicodin [Hydrocodone-Acetaminophen] Hives and Nausea And Vomiting    Projectile vomiting  . Alprazolam Other (See Comments)    Lethargy  . Magnesium Salicylate Hives and Itching  . Rizatriptan Nausea And Vomiting and Other (See  Comments)    GI upset, Projectile vomiting  . Tizanidine Hives  . Toradol [Ketorolac Tromethamine] Nausea Only  . Tramadol Other (See Comments)  . Adhesive [Tape] Other (See Comments)    Skin irritation  . Lamotrigine Rash    FAMILY HISTORY: Family History  Problem Relation Age of Onset  . Diabetes Mother   . Hypertension Mother   . Diabetes Father   . Hypertension Father   . Arthritis Father   . Cancer Maternal Grandmother   .  Cancer Maternal Grandfather     SOCIAL HISTORY: Social History   Social History  . Marital Status: Single    Spouse Name: N/A  . Number of Children: 0  . Years of Education: College   Occupational History  .  Other    disability   Social History Main Topics  . Smoking status: Current Every Day Smoker -- 0.50 packs/day for 10 years    Types: Cigarettes  . Smokeless tobacco: Never Used  . Alcohol Use: No     Comment: Rare  . Drug Use: No  . Sexual Activity: Yes    Birth Control/ Protection: None, Condom   Other Topics Concern  . Not on file   Social History Narrative   Patient lives at home with fiance.   Caffeine Use: 1 20oz soda daily    REVIEW OF SYSTEMS: Constitutional: No fevers, chills, or sweats, no generalized fatigue, change in appetite Eyes: No visual changes, double vision, +eye pain Ear, nose and throat: No hearing loss, ear pain, nasal congestion, sore throat Cardiovascular: No chest pain, palpitations Respiratory:  No shortness of breath at rest or with exertion, wheezes GastrointestinaI: No nausea, vomiting, diarrhea, abdominal pain, fecal incontinence Genitourinary:  No dysuria, urinary retention or frequency Musculoskeletal:  No neck pain, back pain Integumentary: No rash, pruritus, skin lesions Neurological: as above Psychiatric: No depression, insomnia, anxiety Endocrine: No palpitations, fatigue, diaphoresis, mood swings, change in appetite, change in weight, increased thirst Hematologic/Lymphatic:  No anemia,  purpura, petechiae. Allergic/Immunologic: no itchy/runny eyes, nasal congestion, recent allergic reactions, rashes  PHYSICAL EXAM: Filed Vitals:   04/26/15 1116  BP: 120/88  Pulse: 103   General: No acute distress, diaphoretic Head: Normocephalic/atraumatic Eyes: Fundoscopic exam shows bilateral sharp discs, no vessel changes, exudates, or hemorrhages Neck: supple, no paraspinal tenderness, full range of motion Back: No paraspinal tenderness Heart: regular rate and rhythm Lungs: Clear to auscultation bilaterally. Vascular: No carotid bruits. Skin/Extremities: No rash, no edema Neurological Exam: Mental status: alert and oriented to person, place, and time, no dysarthria or aphasia, Fund of knowledge is appropriate. Recent and remote memory are intact. Attention and concentration are normal. Able to name objects and repeat phrases. Cranial nerves: CN I: not tested CN II: pupils equal, round and reactive to light, visual acuity OD 20/20 OS 20/40, +red color desaturation on right eye, visual fields intact, fundi unremarkable. CN III, IV, VI: full range of motion, no nystagmus, no ptosis CN V: facial sensation intact CN VII: upper and lower face symmetric CN VIII: hearing intact to finger rub CN IX, X: gag intact, uvula midline CN XI: sternocleidomastoid and trapezius muscles intact CN XII: tongue midline Bulk & Tone: normal, no fasciculations. Motor: 5/5 throughout with no pronator drift. Sensation: intact to light touch, cold, pin, vibration and joint position sense. No extinction to double simultaneous stimulation. Romberg test negative Deep Tendon Reflexes: +2 throughout, no ankle clonus Plantar responses: downgoing bilaterally Cerebellar: no incoordination on finger to nose testing Gait: slow and cautious, has a cane but can ambulate without cane Tremor: none  IMPRESSION: This is a 35 yo RH woman with a history of relapsing-remitting multiple sclerosis, headaches,  anxiety with panic attacks, conversion disorder with psychogenic seizures, currently pregnant and presenting for possible optic neuritis and worsening migraines. She has a history of right optic neuritis in 2008, but reports vision is now worse on the right, with associated right eye pain. Exam as above. Will give 3 days of IV Solumedrol, she has been cleared  for steroids by her OB. We discussed the option of increasing Labetalol to 150mg  BID for migraine prophylaxis, however she feels the migraines are triggered by eye pain and focusing difficulties, and would like to hold off on medication changes for now. She was advised to see her eye doctor. She reports occasional shaking episodes with panic attacks and only takes diazepam for severe anxiety. She does not drive. She will follow-up in 5 months and knows to call for any changes.   Thank you for allowing me to participate in her care.  Please do not hesitate to call for any questions or concerns.  The duration of this appointment visit was 25 minutes of face-to-face time with the patient.  Greater than 50% of this time was spent in counseling, explanation of diagnosis, planning of further management, and coordination of care.   Ellouise Newer, M.D.   CC: Dr. Harolyn Rutherford

## 2015-04-26 NOTE — Patient Instructions (Signed)
1. Schedule Solumedrol infusions for MS: Solumedrol 1000mg  IV x 3 days 2. Continue all your other medications 3. Follow-up as scheduled in September, good luck with the rest of your pregnancy!

## 2015-04-27 ENCOUNTER — Encounter: Payer: Self-pay | Admitting: Neurology

## 2015-04-27 DIAGNOSIS — H469 Unspecified optic neuritis: Secondary | ICD-10-CM | POA: Insufficient documentation

## 2015-04-30 ENCOUNTER — Other Ambulatory Visit: Payer: Self-pay | Admitting: Family Medicine

## 2015-05-01 ENCOUNTER — Other Ambulatory Visit: Payer: Self-pay | Admitting: Family Medicine

## 2015-05-01 ENCOUNTER — Encounter: Payer: Medicare Other | Admitting: Obstetrics & Gynecology

## 2015-05-01 ENCOUNTER — Ambulatory Visit (HOSPITAL_COMMUNITY)
Admission: RE | Admit: 2015-05-01 | Discharge: 2015-05-01 | Disposition: A | Discharge status: Home / Self Care | Payer: Medicare Other | Source: Ambulatory Visit | Attending: Neurology | Admitting: Neurology

## 2015-05-01 DIAGNOSIS — G35 Multiple sclerosis: Secondary | ICD-10-CM | POA: Diagnosis not present

## 2015-05-01 LAB — POCT URINALYSIS DIP (DEVICE)
Bilirubin Urine: NEGATIVE
Glucose, UA: NEGATIVE mg/dL
KETONES UR: NEGATIVE mg/dL
Nitrite: NEGATIVE
PH: 7 (ref 5.0–8.0)
PROTEIN: NEGATIVE mg/dL
Specific Gravity, Urine: 1.015 (ref 1.005–1.030)
Urobilinogen, UA: 0.2 mg/dL (ref 0.0–1.0)

## 2015-05-01 MED ORDER — SODIUM CHLORIDE 0.9 % IV SOLN: 1000.0000 mg | Freq: Every day | INTRAVENOUS | Status: DC

## 2015-05-01 MED ORDER — SODIUM CHLORIDE 0.9 % IV SOLN
1000.0000 mg | INTRAVENOUS | Status: DC
  Administered 2015-05-01: 1000 mg via INTRAVENOUS
  Filled 2015-05-01: qty 8

## 2015-05-02 ENCOUNTER — Other Ambulatory Visit (HOSPITAL_COMMUNITY): Payer: Self-pay | Admitting: *Deleted

## 2015-05-02 ENCOUNTER — Encounter (HOSPITAL_COMMUNITY)
Admission: RE | Admit: 2015-05-02 | Discharge: 2015-05-02 | Disposition: A | Discharge status: Home / Self Care | Payer: Medicare Other | Source: Ambulatory Visit | Attending: Neurology | Admitting: Neurology

## 2015-05-02 DIAGNOSIS — G35 Multiple sclerosis: Secondary | ICD-10-CM | POA: Diagnosis not present

## 2015-05-02 MED ORDER — SODIUM CHLORIDE 0.9 % IV SOLN
1000.0000 mg | INTRAVENOUS | Status: DC
  Administered 2015-05-02: 1000 mg via INTRAVENOUS
  Filled 2015-05-02: qty 8

## 2015-05-02 NOTE — Progress Notes (Signed)
Pt completed second solumedrol dose today and will be back tomorrow for third dose. NSL flushed easily with 10cc NS. IV left intact and secured for tomorrows use.

## 2015-05-03 ENCOUNTER — Encounter (HOSPITAL_COMMUNITY)
Admission: RE | Admit: 2015-05-03 | Discharge: 2015-05-03 | Disposition: A | Discharge status: Home / Self Care | Payer: Medicare Other | Source: Ambulatory Visit | Attending: Neurology | Admitting: Neurology

## 2015-05-03 DIAGNOSIS — G35 Multiple sclerosis: Secondary | ICD-10-CM | POA: Diagnosis not present

## 2015-05-03 MED ORDER — SODIUM CHLORIDE 0.9 % IV SOLN
1000.0000 mg | INTRAVENOUS | Status: DC
  Administered 2015-05-03: 1000 mg via INTRAVENOUS
  Filled 2015-05-03: qty 8

## 2015-05-08 ENCOUNTER — Ambulatory Visit (INDEPENDENT_AMBULATORY_CARE_PROVIDER_SITE_OTHER): Payer: Medicare Other | Admitting: Advanced Practice Midwife

## 2015-05-08 VITALS — BP 99/78 | HR 100 | Wt 237.2 lb

## 2015-05-08 DIAGNOSIS — G35 Multiple sclerosis: Secondary | ICD-10-CM | POA: Diagnosis not present

## 2015-05-08 DIAGNOSIS — Z789 Other specified health status: Secondary | ICD-10-CM | POA: Insufficient documentation

## 2015-05-08 DIAGNOSIS — O99353 Diseases of the nervous system complicating pregnancy, third trimester: Secondary | ICD-10-CM

## 2015-05-08 DIAGNOSIS — O99350 Diseases of the nervous system complicating pregnancy, unspecified trimester: Secondary | ICD-10-CM

## 2015-05-08 DIAGNOSIS — O10912 Unspecified pre-existing hypertension complicating pregnancy, second trimester: Secondary | ICD-10-CM | POA: Diagnosis present

## 2015-05-08 DIAGNOSIS — O365920 Maternal care for other known or suspected poor fetal growth, second trimester, not applicable or unspecified: Secondary | ICD-10-CM

## 2015-05-08 DIAGNOSIS — O0992 Supervision of high risk pregnancy, unspecified, second trimester: Secondary | ICD-10-CM

## 2015-05-08 LAB — POCT URINALYSIS DIP (DEVICE)
BILIRUBIN URINE: NEGATIVE
GLUCOSE, UA: NEGATIVE mg/dL
KETONES UR: NEGATIVE mg/dL
Nitrite: NEGATIVE
Protein, ur: 30 mg/dL — AB
Specific Gravity, Urine: 1.02 (ref 1.005–1.030)
Urobilinogen, UA: 0.2 mg/dL (ref 0.0–1.0)
pH: 7 (ref 5.0–8.0)

## 2015-05-08 NOTE — Patient Instructions (Signed)

## 2015-05-09 ENCOUNTER — Ambulatory Visit (HOSPITAL_COMMUNITY)
Admission: RE | Admit: 2015-05-09 | Discharge: 2015-05-09 | Disposition: A | Discharge status: Home / Self Care | Payer: Medicare Other | Source: Ambulatory Visit | Attending: Obstetrics and Gynecology | Admitting: Obstetrics and Gynecology

## 2015-05-09 ENCOUNTER — Encounter (HOSPITAL_COMMUNITY): Payer: Self-pay

## 2015-05-09 ENCOUNTER — Other Ambulatory Visit (HOSPITAL_COMMUNITY): Payer: Self-pay | Admitting: Maternal and Fetal Medicine

## 2015-05-09 VITALS — BP 122/90 | HR 97 | Wt 236.4 lb

## 2015-05-09 DIAGNOSIS — O365929 Maternal care for other known or suspected poor fetal growth, second trimester, other fetus: Secondary | ICD-10-CM

## 2015-05-09 DIAGNOSIS — O99352 Diseases of the nervous system complicating pregnancy, second trimester: Secondary | ICD-10-CM

## 2015-05-09 DIAGNOSIS — Z0489 Encounter for examination and observation for other specified reasons: Secondary | ICD-10-CM

## 2015-05-09 DIAGNOSIS — O99332 Smoking (tobacco) complicating pregnancy, second trimester: Secondary | ICD-10-CM | POA: Insufficient documentation

## 2015-05-09 DIAGNOSIS — O99322 Drug use complicating pregnancy, second trimester: Secondary | ICD-10-CM

## 2015-05-09 DIAGNOSIS — O10919 Unspecified pre-existing hypertension complicating pregnancy, unspecified trimester: Secondary | ICD-10-CM

## 2015-05-09 DIAGNOSIS — IMO0002 Reserved for concepts with insufficient information to code with codable children: Secondary | ICD-10-CM

## 2015-05-09 DIAGNOSIS — G35 Multiple sclerosis: Secondary | ICD-10-CM

## 2015-05-09 DIAGNOSIS — Z36 Encounter for antenatal screening of mother: Secondary | ICD-10-CM | POA: Diagnosis not present

## 2015-05-09 DIAGNOSIS — O09522 Supervision of elderly multigravida, second trimester: Secondary | ICD-10-CM

## 2015-05-09 DIAGNOSIS — O10012 Pre-existing essential hypertension complicating pregnancy, second trimester: Secondary | ICD-10-CM | POA: Diagnosis not present

## 2015-05-09 DIAGNOSIS — Z3A26 26 weeks gestation of pregnancy: Secondary | ICD-10-CM | POA: Diagnosis not present

## 2015-05-09 DIAGNOSIS — O365920 Maternal care for other known or suspected poor fetal growth, second trimester, not applicable or unspecified: Secondary | ICD-10-CM | POA: Diagnosis not present

## 2015-05-09 DIAGNOSIS — O99212 Obesity complicating pregnancy, second trimester: Secondary | ICD-10-CM | POA: Diagnosis not present

## 2015-05-10 DIAGNOSIS — O365990 Maternal care for other known or suspected poor fetal growth, unspecified trimester, not applicable or unspecified: Secondary | ICD-10-CM | POA: Insufficient documentation

## 2015-05-10 DIAGNOSIS — O99350 Diseases of the nervous system complicating pregnancy, unspecified trimester: Secondary | ICD-10-CM

## 2015-05-10 DIAGNOSIS — G35 Multiple sclerosis: Secondary | ICD-10-CM | POA: Insufficient documentation

## 2015-05-10 NOTE — Progress Notes (Signed)
Subjective:  Emily [name] is a 35 y.o. G1P0 at [redacted]w[redacted]d being seen today for ongoing prenatal care.  She is currently monitored for the following issues for this high-risk pregnancy and has Supervision of high-risk pregnancy; Anxiety; Depression; Multiple sclerosis ([place]); Migraine; Seizure disorder ([place]); Smoker; Obesity; Obesity complicating pregnancy, childbirth, or puerperium, antepartum; Tobacco smoking affecting pregnancy, antepartum; AMA (advanced maternal age) primigravida 26+; HTN (hypertension); Preexisting hypertension complicating pregnancy, antepartum; Perforated bowel ([place]); Substance abuse affecting pregnancy, antepartum; Advanced maternal age, 1st pregnancy; Conversion disorder with attacks or seizures, persistent, with psychological stressor; Migraine with aura and without status migrainosus, not intractable; Generalized anxiety disorder; 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  Patient reports no complaints.  Contractions: Not present. Vag. Bleeding: None.  Movement: Present. Denies leaking of fluid.   The following portions of the patient's history were reviewed and updated as appropriate: allergies, current medications, past family history, past medical history, past social history, past surgical history and problem list. Problem list updated.  Objective:   Filed Vitals:   05/08/15 1205  BP: 99/78  Pulse: 100  Weight: 237 lb 3.2 oz (107.593 kg)    Fetal Status: Fetal Heart Rate (bpm): 159 Fundal Height: 26 cm Movement: Present     General:  Alert, oriented and cooperative. Patient is in no acute distress.  Skin: Skin is warm and dry. No rash noted.   Cardiovascular: Normal heart rate noted  Respiratory: Normal respiratory effort, no problems with respiration noted  Abdomen: Soft, gravid, appropriate for gestational age. Pain/Pressure: Present     Pelvic: Vag. Bleeding: None      Cervical exam deferred        Extremities: Normal range of motion.  Edema: None  Mental Status: Normal mood and affect. Normal behavior. Normal judgment and thought content.   Urinalysis: Urine Protein: 1+ Urine Glucose: Negative  Assessment and Plan:  Pregnancy: G1P0 at [redacted]w[redacted]d  1. Difficult intravenous access   2. Supervision of high-risk pregnancy, second trimester   3. Preexisting hypertension complicating pregnancy, antepartum, second trimester - Growth [place] 05/09/15  4. Multiple sclerosis affecting pregnancy, antepartum ([place])  Preterm labor symptoms and general obstetric precautions including but not limited to vaginal bleeding, contractions, leaking of fluid and fetal movement were reviewed in detail with the patient. Please refer to After Visit Summary for other counseling recommendations.  Return in about 2 weeks (around 05/22/2015).   Manya Silvas, CNM

## 2015-05-15 ENCOUNTER — Telehealth: Payer: Self-pay | Admitting: *Deleted

## 2015-05-15 NOTE — Telephone Encounter (Signed)
S[place]ne called and left a message today she tried to get prescriptions filled but pharmacy said her medicaid/medicare Do not cover baby asa, and hydroxyzine. States they told her need to request meds that are covered ; also want  albuteral inhaler refill because she is having cold symtoms.

## 2015-05-15 NOTE — Telephone Encounter (Signed)
[place] called back , call transferred to nurse. States she needs meds prescribed that are similar to baby asa and hydroxyine- was taking hydroyine for anxiety. States she had bought some hydroxyine , but is on fixed income and can't afford to do that again.  I informed her I am not sure if there is another med similar to baby asa, but will send to [place] who saw her last.  She also wants to know if she can get a refill of albuterol because she is having the same symptoms again- I told her we should be aqble to get  A refill, but if she felt in distress to come to [facility] At any time. She states she is ok for now.

## 2015-05-16 ENCOUNTER — Other Ambulatory Visit: Payer: Self-pay | Admitting: Advanced Practice Midwife

## 2015-05-16 ENCOUNTER — Ambulatory Visit (INDEPENDENT_AMBULATORY_CARE_PROVIDER_SITE_OTHER): Payer: Medicare Other | Admitting: Psychiatry

## 2015-05-16 ENCOUNTER — Encounter (HOSPITAL_COMMUNITY): Payer: Self-pay | Admitting: Psychiatry

## 2015-05-16 DIAGNOSIS — F411 Generalized anxiety disorder: Secondary | ICD-10-CM

## 2015-05-16 DIAGNOSIS — F419 Anxiety disorder, unspecified: Secondary | ICD-10-CM

## 2015-05-16 DIAGNOSIS — F4310 Post-traumatic stress disorder, unspecified: Secondary | ICD-10-CM

## 2015-05-16 DIAGNOSIS — O10912 Unspecified pre-existing hypertension complicating pregnancy, second trimester: Secondary | ICD-10-CM

## 2015-05-16 DIAGNOSIS — F445 Conversion disorder with seizures or convulsions: Secondary | ICD-10-CM | POA: Diagnosis not present

## 2015-05-16 DIAGNOSIS — F332 Major depressive disorder, recurrent severe without psychotic features: Secondary | ICD-10-CM

## 2015-05-16 DIAGNOSIS — F4001 Agoraphobia with panic disorder: Secondary | ICD-10-CM

## 2015-05-16 MED ORDER — HYDROXYZINE HCL 25 MG PO TABS: 25.0000 mg | ORAL_TABLET | Freq: Three times a day (TID) | ORAL | Status: DC | PRN

## 2015-05-16 MED ORDER — ASPIRIN 81 MG PO TABS: 81.0000 mg | ORAL_TABLET | Freq: Every day | ORAL | Status: DC

## 2015-05-16 MED ORDER — FLUOXETINE HCL 20 MG PO TABS: 20.0000 mg | ORAL_TABLET | Freq: Every day | ORAL | Status: DC

## 2015-05-16 MED ORDER — BUSPIRONE HCL 5 MG PO TABS: 5.0000 mg | ORAL_TABLET | Freq: Two times a day (BID) | ORAL | Status: DC

## 2015-05-16 NOTE — Progress Notes (Signed)
Called Emily [name] and notified her we have the  Prescriptions ready for her to pick up from our clinic And take to [facility] outpatient pharmacy. She voices understanding

## 2015-05-16 NOTE — Telephone Encounter (Signed)
Called patient and we discussed reccommendations from [place], [place]- patient will come by today to get the prescriptions.

## 2015-05-16 NOTE — Progress Notes (Signed)
Please notify pt that Vistaril (Atarax) #30 is $9 and baby ASA #100 is $2 at [facility] outpatient pharmacy.
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
## 2015-05-16 NOTE — Telephone Encounter (Signed)
Pt was Rx'd baby ASA due to [facility] to decrease risk of Pre-E. There is no substitute for this, but she can use a generic. I recommend that she check her Rx coverage w/ Medicaid/Medicare to see what meds are covered for anxiety. Most are not safe in pregnancy. If her Anxiety is unstable then, she needs to F/U w/ mental health care provider. May refill albuterol.

## 2015-05-17 ENCOUNTER — Other Ambulatory Visit: Payer: Self-pay | Admitting: Obstetrics and Gynecology

## 2015-05-18 MED FILL — PROAIR HFA 90 MCG INHALER: 108 (90 BAS | 25 days supply | Qty: 9 | Fill #0

## 2015-05-18 MED FILL — hydrOXYzine HCL 25 MG TABS: 25 | 10 days supply | Qty: 30 | Fill #0

## 2015-05-22 ENCOUNTER — Encounter: Payer: Self-pay | Admitting: *Deleted

## 2015-05-22 ENCOUNTER — Ambulatory Visit (INDEPENDENT_AMBULATORY_CARE_PROVIDER_SITE_OTHER): Payer: Medicare Other | Admitting: Family Medicine

## 2015-05-22 VITALS — BP 117/95 | HR 88 | Wt 243.2 lb

## 2015-05-22 DIAGNOSIS — O10913 Unspecified pre-existing hypertension complicating pregnancy, third trimester: Secondary | ICD-10-CM

## 2015-05-22 DIAGNOSIS — O0992 Supervision of high risk pregnancy, unspecified, second trimester: Secondary | ICD-10-CM | POA: Diagnosis not present

## 2015-05-22 DIAGNOSIS — O365910 Maternal care for other known or suspected poor fetal growth, first trimester, not applicable or unspecified: Secondary | ICD-10-CM | POA: Diagnosis not present

## 2015-05-22 DIAGNOSIS — F1910 Other psychoactive substance abuse, uncomplicated: Secondary | ICD-10-CM

## 2015-05-22 DIAGNOSIS — O99320 Drug use complicating pregnancy, unspecified trimester: Secondary | ICD-10-CM

## 2015-05-22 DIAGNOSIS — O99213 Obesity complicating pregnancy, third trimester: Secondary | ICD-10-CM | POA: Diagnosis not present

## 2015-05-22 DIAGNOSIS — E669 Obesity, unspecified: Secondary | ICD-10-CM | POA: Diagnosis not present

## 2015-05-22 DIAGNOSIS — O99350 Diseases of the nervous system complicating pregnancy, unspecified trimester: Secondary | ICD-10-CM

## 2015-05-22 DIAGNOSIS — O09513 Supervision of elderly primigravida, third trimester: Secondary | ICD-10-CM

## 2015-05-22 DIAGNOSIS — Z23 Encounter for immunization: Secondary | ICD-10-CM

## 2015-05-22 DIAGNOSIS — G40909 Epilepsy, unspecified, not intractable, without status epilepticus: Secondary | ICD-10-CM

## 2015-05-22 DIAGNOSIS — O99333 Smoking (tobacco) complicating pregnancy, third trimester: Secondary | ICD-10-CM

## 2015-05-22 DIAGNOSIS — K631 Perforation of intestine (nontraumatic): Secondary | ICD-10-CM

## 2015-05-22 DIAGNOSIS — G35 Multiple sclerosis: Secondary | ICD-10-CM

## 2015-05-22 LAB — CBC
HEMATOCRIT: 39 % (ref 35.0–45.0)
Hemoglobin: 13.1 g/dL (ref 11.7–15.5)
MCH: 30.1 pg (ref 27.0–33.0)
MCHC: 33.6 g/dL (ref 32.0–36.0)
MCV: 89.7 fL (ref 80.0–100.0)
MPV: 10.4 fL (ref 7.5–12.5)
PLATELETS: 408 10*3/uL — AB (ref 140–400)
RBC: 4.35 MIL/uL (ref 3.80–5.10)
RDW: 13.8 % (ref 11.0–15.0)
WBC: 14.8 10*3/uL — AB (ref 3.8–10.8)

## 2015-05-22 LAB — POCT URINALYSIS DIP (DEVICE)
BILIRUBIN URINE: NEGATIVE
GLUCOSE, UA: NEGATIVE mg/dL
Ketones, ur: NEGATIVE mg/dL
LEUKOCYTES UA: NEGATIVE
Nitrite: NEGATIVE
Protein, ur: NEGATIVE mg/dL
SPECIFIC GRAVITY, URINE: 1.015 (ref 1.005–1.030)
UROBILINOGEN UA: 0.2 mg/dL (ref 0.0–1.0)
pH: 5.5 (ref 5.0–8.0)

## 2015-05-22 MED ORDER — TETANUS-DIPHTH-ACELL PERTUSSIS 5-2.5-18.5 LF-MCG/0.5 IM SUSP
0.5000 mL | Freq: Once | INTRAMUSCULAR | Status: AC
  Administered 2015-05-22: 0.5 mL via INTRAMUSCULAR

## 2015-05-22 NOTE — Patient Instructions (Signed)
Intrauterine Growth Restriction Intrauterine growth restriction (IUGR) is when your baby is not growing normally during your pregnancy. A baby with IUGR is smaller than it should be and may weigh less than normal at birth.  IUGR can result if there is a problem with the organ that supplies your baby with oxygen and nutrition (placenta). Usually, there is no way to prevent this type of problem.  CAUSES  The most common cause of IUGR is a problem with the placenta or umbilical cord that causes your developing baby to get less oxygen or nutrition than needed. Other causes include:  Poor maternal nutrition.  Chemicals found in substances such as cigarettes, alcohol, and some illegal drugs.  Some prescription medicines.  Congenital defects.  Genetic disorders.  An infection.  Carrying more than one baby, such as twins or triplets (multiple gestations). RISK FACTORS This condition is more likely to develop in women who:  Are over the age of 66 at the time of delivery (advanced maternal age).  Have medical conditions such as high blood pressure, diabetes, heart or kidney disease, anemia, or conditions that increase the risk for blood clotting.  Live at a very high altitude during pregnancy.  Have a personal history or family history of IUGR.  Take medicines during pregnancy that are linked with congenital disabilities.  Have a personal or family history of a genetic disorder.  Come into contact with infected cat feces (toxoplasmosis).  Come into contact with chickenpox (varicella) or [place] measles (rubella).  Have or are at risk of getting an infectious disease such as syphilis, HIV, or herpes.  Eat poorly during their pregnancy.  Weigh less than 100 pounds.  Have a family history of multiple gestations.  Have had infertility treatments.   Use tobacco, illegal drugs, or drink alcohol during pregnancy. SYMPTOMS IUGR does not cause many symptoms. You might notice that your  baby does not move or kick very often. Also, your belly may not be as big as expected for the stage of your pregnancy. DIAGNOSIS This condition is diagnosed with physical and prenatal exams. Your health care provider will measure the size of your baby inside your womb during a routine screening exam using sound waves (ultrasound). Your health care provider will compare the size of your baby to the size of other babies at the same stage of development (gestational age). Your health care provider will diagnose IUGR if your baby is smaller than 76 percent of all other babies at the same gestational age.  You may also have tests to find the cause of IUGR. These can include:  Having fluid removed from your womb to check for signs of infection or a congenital disability (amniocentesis).  A series of tests to monitor your baby's health (fetal monitoring). TREATMENT  In most cases, treatment for this condition focuses on stopping the cause of your baby's small growth. Your health care providers will monitor your pregnancy closely and help you manage your pregnancy. If this condition is caused by a placenta problem and the baby is not getting enough blood, treatment may include:   Medicine to start labor and deliver your baby early (induction).  Cesarean delivery. In this procedure, your baby is delivered through a cut (incision) in the abdomen and womb (uterus). HOME CARE INSTRUCTIONS   Make sure you are eating enough calories and gaining enough weight.  Eat a balanced diet. Work with a Financial planner (dietitian), if needed.   Rest as needed. Try to get at least eight  hours of sleep every night.  Do not drink alcohol.   Do not use illegal drugs.   Do not use any tobacco products, including cigarettes, chewing tobacco, or e-cigarettes. If you need help quitting, ask your health care provider.  Talk to your health care provider about steps you can take to avoid infections.  Take  medicines, vitamins, and mineral supplements only as directed by your health care provider. Make sure that your health care provider knows about all of the prescribed or over-the-counter medicines, supplements, vitamins, eye drops, and creams that you are using.  Keep all follow-up visits as directed by your health care provider. This is important. SEEK MEDICAL CARE IF:   Your baby is not moving as often as before.   This information is not intended to replace advice given to you by your health care provider. Make sure you discuss any questions you have with your health care provider.   Document Released: 10/03/2007 Document Revised: 05/10/2014 Document Reviewed: 12/20/2013 Elsevier Interactive Patient Education [employer].

## 2015-05-22 NOTE — Progress Notes (Signed)
Subjective:  Emily [name] is a 35 y.o. G1P0 at [redacted]w[redacted]d being seen today for ongoing prenatal care.  She is currently monitored for the following issues for this high-risk pregnancy and has Supervision of high-risk pregnancy; Anxiety; Depression; Multiple sclerosis ([place]); Migraine; Seizure disorder ([place]); Smoker; Obesity; Obesity complicating pregnancy, childbirth, or puerperium, antepartum; Tobacco smoking affecting pregnancy, antepartum; AMA (advanced maternal age) primigravida 21+; HTN (hypertension); Preexisting hypertension complicating pregnancy, antepartum; Perforated bowel ([place]); Substance abuse affecting pregnancy, antepartum; Advanced maternal age, 1st pregnancy; Conversion disorder with attacks or seizures, persistent, with psychological stressor; Migraine with aura and without status migrainosus, not intractable; Generalized anxiety disorder; 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  Patient reports no complaints.  Contractions: Not present. Vag. Bleeding: None.  Movement: Present. Denies leaking of fluid.   Reports HA- h/o migraines, no changes in vision, no RUQ pain, no swelling. These HA feel like her migraines  The following portions of the patient's history were reviewed and updated as appropriate: allergies, current medications, past family history, past medical history, past social history, past surgical history and problem list. Problem list updated.  Objective:   Filed Vitals:   05/22/15 0952  BP: 117/95  Pulse: 88  Weight: 243 lb 3.2  oz (110.315 kg)    Fetal Status: Fetal Heart Rate (bpm): 154   Movement: Present     General:  Alert, oriented and cooperative. Patient is in no acute distress.  Skin: Skin is warm and dry. No rash noted.   Cardiovascular: Normal heart rate noted  Respiratory: Normal respiratory effort, no problems with respiration noted  Abdomen: Soft, gravid, appropriate for gestational age. Pain/Pressure: Present     Pelvic: Vag. Bleeding: None     Cervical exam deferred        Extremities: Normal range of motion.  Edema: Trace  Mental Status: Normal mood and affect. Normal behavior. Normal judgment and thought content.   Urinalysis: Urine Protein: Negative Urine Glucose: Negative  Assessment and Plan:  Pregnancy: G1P0 at [redacted]w[redacted]d  1. Supervision of high-risk pregnancy, second trimester - Updated box - Glucose Tolerance, 1 HR (50g) w/o Fasting - CBC - RPR - HIV antibody (with reflex) - Tdap (BOOSTRIX) injection 0.5 mL; Inject 0.5 mLs into the muscle once.  2. Preexisting hypertension complicating pregnancy, antepartum, third trimester - BP wnl  3. Perforated bowel ([place]) - s/p colostomy and colostomy takedown.  4. Multiple sclerosis affecting pregnancy, antepartum ([place]) - Patient reports MFM recommended possible primary CS given her MS.  Patient has a hx of bowel perforation with colostomy and colostomy take down. We discussed risk of surgery and adhesive disease. Per patient MFM thought it would "be good for her to be in the hospital longer if she has a flare." I discussed the MS is not typically an indication for a primary CS and that given her prior surgery and possible future pregnancies we would generally recommend vaginal delivery.   5. Seizure disorder ([place])  6. AMA (advanced maternal age) primigravida 34+, third trimester  7. Obesity complicating pregnancy, childbirth, or puerperium, antepartum, third trimester -recommend 11-20 lb weight gain  8. Poor fetal growth, affecting  management of mother, antepartum condition or complication,  first trimester, not applicable or unspecified fetus -IUGR in 12th%, has [place] scheduled for follow growth.   9. Substance abuse affecting pregnancy, antepartum  10. Tobacco smoking affecting pregnancy, antepartum, third trimester - 4 cigs per day  Preterm labor symptoms and general obstetric precautions including but not limited to vaginal bleeding, contractions, leaking of fluid and fetal movement were reviewed in detail with the patient. Please refer to After Visit Summary for other counseling recommendations.  Return in about 3 weeks (around 06/12/2015) for Routine prenatal care,  Start NST Testing (2xweekly).   Caren Macadam, MD

## 2015-05-22 NOTE — Progress Notes (Signed)
Urine: small amt hgb

## 2015-05-23 ENCOUNTER — Other Ambulatory Visit: Payer: Self-pay | Admitting: Family Medicine

## 2015-05-23 LAB — GLUCOSE TOLERANCE, 1 HOUR (50G) W/O FASTING: Glucose, 1 Hr, gestational: 92 mg/dL (ref <140)

## 2015-05-23 LAB — HIV ANTIBODY (ROUTINE TESTING W REFLEX): HIV: NONREACTIVE

## 2015-05-23 LAB — RPR

## 2015-05-29 ENCOUNTER — Telehealth: Payer: Self-pay | Admitting: Neurology

## 2015-05-29 NOTE — Telephone Encounter (Signed)
[name] left a message saying her hands were messing up and she's pregnant and needs to get in to see a doctor/Dawn CB# [phone]

## 2015-05-29 NOTE — Telephone Encounter (Signed)
This sounds more like carpal tunnel syndrome, which can be common during pregnancy.  Please have her start using a wrist splint.  If no, improvement within 1-2 weeks, she will need to be scheduled for NCS/EMG.

## 2015-05-29 NOTE — Telephone Encounter (Signed)
Pt suffers w/ MS.  She is  [redacted] weeks pregnant. She has been having lots of numbness and pain in her hands, bilaterally, for a few days. Pt states that [facility] told her to call and report any symptoms to Dr. Delice Lesch during pregnancy. Please advise.

## 2015-05-29 NOTE — Telephone Encounter (Signed)
Called and relayed message to patient. Pt states it is not carpal Tunnel, that is how her MS presents. Pt states before she was diagnosis she suffered same issues, was told at time of dx that symptoms were from [place] not Carpal Tunnel. Pt stated that she sprained her L wrist in December, and has been wearing a brace on that wrist since that time. Please advise.

## 2015-05-29 NOTE — Telephone Encounter (Signed)
Pregnancy is generally very protective for MS and I agree with Dr. Posey Pronto that this is likely carpal tunnel symptoms.  During pregnancy, fluid volume increases and puts more pressure on the carpal tunnel nerves.

## 2015-05-30 ENCOUNTER — Ambulatory Visit (HOSPITAL_COMMUNITY)
Admission: RE | Admit: 2015-05-30 | Discharge: 2015-05-30 | Disposition: A | Discharge status: Home / Self Care | Payer: Medicare Other | Source: Ambulatory Visit | Attending: Obstetrics and Gynecology | Admitting: Obstetrics and Gynecology

## 2015-05-30 ENCOUNTER — Other Ambulatory Visit (HOSPITAL_COMMUNITY): Payer: Self-pay | Admitting: Obstetrics and Gynecology

## 2015-05-30 VITALS — BP 131/93 | HR 92 | Wt 244.0 lb

## 2015-05-30 DIAGNOSIS — O99333 Smoking (tobacco) complicating pregnancy, third trimester: Secondary | ICD-10-CM

## 2015-05-30 DIAGNOSIS — Z0489 Encounter for examination and observation for other specified reasons: Secondary | ICD-10-CM

## 2015-05-30 DIAGNOSIS — O10913 Unspecified pre-existing hypertension complicating pregnancy, third trimester: Secondary | ICD-10-CM

## 2015-05-30 DIAGNOSIS — O99320 Drug use complicating pregnancy, unspecified trimester: Secondary | ICD-10-CM

## 2015-05-30 DIAGNOSIS — O99323 Drug use complicating pregnancy, third trimester: Secondary | ICD-10-CM | POA: Diagnosis not present

## 2015-05-30 DIAGNOSIS — Z36 Encounter for antenatal screening of mother: Secondary | ICD-10-CM | POA: Insufficient documentation

## 2015-05-30 DIAGNOSIS — O365910 Maternal care for other known or suspected poor fetal growth, first trimester, not applicable or unspecified: Secondary | ICD-10-CM

## 2015-05-30 DIAGNOSIS — O365930 Maternal care for other known or suspected poor fetal growth, third trimester, not applicable or unspecified: Secondary | ICD-10-CM

## 2015-05-30 DIAGNOSIS — IMO0002 Reserved for concepts with insufficient information to code with codable children: Secondary | ICD-10-CM

## 2015-05-30 DIAGNOSIS — Z3A29 29 weeks gestation of pregnancy: Secondary | ICD-10-CM

## 2015-05-30 DIAGNOSIS — G35 Multiple sclerosis: Secondary | ICD-10-CM

## 2015-05-30 DIAGNOSIS — O99350 Diseases of the nervous system complicating pregnancy, unspecified trimester: Secondary | ICD-10-CM

## 2015-05-30 DIAGNOSIS — O0992 Supervision of high risk pregnancy, unspecified, second trimester: Secondary | ICD-10-CM

## 2015-05-30 DIAGNOSIS — O99213 Obesity complicating pregnancy, third trimester: Secondary | ICD-10-CM

## 2015-05-30 DIAGNOSIS — O10919 Unspecified pre-existing hypertension complicating pregnancy, unspecified trimester: Secondary | ICD-10-CM

## 2015-05-30 DIAGNOSIS — O10013 Pre-existing essential hypertension complicating pregnancy, third trimester: Secondary | ICD-10-CM | POA: Insufficient documentation

## 2015-05-30 DIAGNOSIS — O09523 Supervision of elderly multigravida, third trimester: Secondary | ICD-10-CM

## 2015-05-30 DIAGNOSIS — G35D Multiple sclerosis, unspecified: Secondary | ICD-10-CM

## 2015-05-30 DIAGNOSIS — O99353 Diseases of the nervous system complicating pregnancy, third trimester: Secondary | ICD-10-CM | POA: Insufficient documentation

## 2015-05-30 DIAGNOSIS — F1910 Other psychoactive substance abuse, uncomplicated: Secondary | ICD-10-CM

## 2015-05-30 DIAGNOSIS — O09513 Supervision of elderly primigravida, third trimester: Secondary | ICD-10-CM

## 2015-05-30 MED ORDER — BETAMETHASONE SOD PHOS & ACET 6 (3-3) MG/ML IJ SUSP
12.0000 mg | Freq: Once | INTRAMUSCULAR | Status: AC
  Administered 2015-05-30: 12 mg via INTRAMUSCULAR
  Filled 2015-05-30: qty 2

## 2015-05-30 MED ORDER — ADJUSTABLE WRIST BRACE MISC
1.0000 | Status: DC
Start: 2015-05-30 — End: 2015-07-21

## 2015-05-30 NOTE — Telephone Encounter (Signed)
Message relayed to pt. Pt would like RX for right wrist splint. Will write and leave at front desk for pickup.

## 2015-05-31 ENCOUNTER — Ambulatory Visit (HOSPITAL_COMMUNITY)
Admission: RE | Admit: 2015-05-31 | Discharge: 2015-05-31 | Disposition: A | Discharge status: Home / Self Care | Payer: Medicare Other | Source: Ambulatory Visit | Attending: Obstetrics and Gynecology | Admitting: Obstetrics and Gynecology

## 2015-05-31 ENCOUNTER — Other Ambulatory Visit (HOSPITAL_COMMUNITY): Payer: Self-pay | Admitting: *Deleted

## 2015-05-31 DIAGNOSIS — Z3A27 27 weeks gestation of pregnancy: Secondary | ICD-10-CM | POA: Insufficient documentation

## 2015-05-31 DIAGNOSIS — IMO0002 Reserved for concepts with insufficient information to code with codable children: Secondary | ICD-10-CM

## 2015-05-31 DIAGNOSIS — O365920 Maternal care for other known or suspected poor fetal growth, second trimester, not applicable or unspecified: Secondary | ICD-10-CM | POA: Diagnosis present

## 2015-05-31 DIAGNOSIS — O365990 Maternal care for other known or suspected poor fetal growth, unspecified trimester, not applicable or unspecified: Secondary | ICD-10-CM | POA: Insufficient documentation

## 2015-05-31 MED ORDER — BETAMETHASONE SOD PHOS & ACET 6 (3-3) MG/ML IJ SUSP
12.0000 mg | Freq: Once | INTRAMUSCULAR | Status: AC
  Administered 2015-05-31: 12 mg via INTRAMUSCULAR
  Filled 2015-05-31: qty 2

## 2015-06-08 ENCOUNTER — Encounter (HOSPITAL_COMMUNITY): Payer: Self-pay

## 2015-06-08 ENCOUNTER — Ambulatory Visit (HOSPITAL_COMMUNITY)
Admission: RE | Admit: 2015-06-08 | Discharge: 2015-06-08 | Disposition: A | Discharge status: Home / Self Care | Payer: Medicare Other | Source: Ambulatory Visit | Attending: Obstetrics and Gynecology | Admitting: Obstetrics and Gynecology

## 2015-06-08 ENCOUNTER — Encounter: Payer: Self-pay | Admitting: Family Medicine

## 2015-06-08 DIAGNOSIS — Z3A30 30 weeks gestation of pregnancy: Secondary | ICD-10-CM | POA: Insufficient documentation

## 2015-06-08 DIAGNOSIS — O365930 Maternal care for other known or suspected poor fetal growth, third trimester, not applicable or unspecified: Secondary | ICD-10-CM | POA: Diagnosis not present

## 2015-06-08 DIAGNOSIS — O09513 Supervision of elderly primigravida, third trimester: Secondary | ICD-10-CM | POA: Diagnosis not present

## 2015-06-08 DIAGNOSIS — O10913 Unspecified pre-existing hypertension complicating pregnancy, third trimester: Secondary | ICD-10-CM | POA: Diagnosis not present

## 2015-06-08 DIAGNOSIS — IMO0002 Reserved for concepts with insufficient information to code with codable children: Secondary | ICD-10-CM

## 2015-06-12 ENCOUNTER — Encounter: Payer: Self-pay | Admitting: Family Medicine

## 2015-06-12 DIAGNOSIS — Z331 Pregnant state, incidental: Secondary | ICD-10-CM | POA: Diagnosis not present

## 2015-06-12 DIAGNOSIS — F419 Anxiety disorder, unspecified: Secondary | ICD-10-CM | POA: Diagnosis not present

## 2015-06-12 DIAGNOSIS — G35 Multiple sclerosis: Secondary | ICD-10-CM | POA: Diagnosis not present

## 2015-06-12 DIAGNOSIS — R569 Unspecified convulsions: Secondary | ICD-10-CM | POA: Diagnosis not present

## 2015-06-14 ENCOUNTER — Other Ambulatory Visit: Payer: Self-pay | Admitting: Family Medicine

## 2015-06-15 ENCOUNTER — Ambulatory Visit (HOSPITAL_COMMUNITY)
Admission: RE | Admit: 2015-06-15 | Discharge: 2015-06-15 | Disposition: A | Discharge status: Home / Self Care | Payer: Medicare Other | Source: Ambulatory Visit | Attending: Obstetrics and Gynecology | Admitting: Obstetrics and Gynecology

## 2015-06-15 ENCOUNTER — Encounter (HOSPITAL_COMMUNITY): Payer: Self-pay

## 2015-06-15 ENCOUNTER — Other Ambulatory Visit (HOSPITAL_COMMUNITY): Payer: Self-pay | Admitting: Maternal and Fetal Medicine

## 2015-06-15 DIAGNOSIS — O169 Unspecified maternal hypertension, unspecified trimester: Secondary | ICD-10-CM

## 2015-06-15 DIAGNOSIS — O365930 Maternal care for other known or suspected poor fetal growth, third trimester, not applicable or unspecified: Secondary | ICD-10-CM | POA: Insufficient documentation

## 2015-06-15 DIAGNOSIS — O99323 Drug use complicating pregnancy, third trimester: Secondary | ICD-10-CM | POA: Diagnosis not present

## 2015-06-15 DIAGNOSIS — G35 Multiple sclerosis: Secondary | ICD-10-CM

## 2015-06-15 DIAGNOSIS — Z3A31 31 weeks gestation of pregnancy: Secondary | ICD-10-CM | POA: Insufficient documentation

## 2015-06-15 DIAGNOSIS — IMO0002 Reserved for concepts with insufficient information to code with codable children: Secondary | ICD-10-CM

## 2015-06-15 DIAGNOSIS — O99333 Smoking (tobacco) complicating pregnancy, third trimester: Secondary | ICD-10-CM | POA: Insufficient documentation

## 2015-06-15 DIAGNOSIS — O99353 Diseases of the nervous system complicating pregnancy, third trimester: Secondary | ICD-10-CM | POA: Diagnosis not present

## 2015-06-15 DIAGNOSIS — O10919 Unspecified pre-existing hypertension complicating pregnancy, unspecified trimester: Secondary | ICD-10-CM

## 2015-06-15 DIAGNOSIS — O10019 Pre-existing essential hypertension complicating pregnancy, unspecified trimester: Secondary | ICD-10-CM | POA: Diagnosis not present

## 2015-06-15 DIAGNOSIS — O10013 Pre-existing essential hypertension complicating pregnancy, third trimester: Secondary | ICD-10-CM | POA: Diagnosis not present

## 2015-06-15 DIAGNOSIS — O99213 Obesity complicating pregnancy, third trimester: Secondary | ICD-10-CM | POA: Diagnosis not present

## 2015-06-15 DIAGNOSIS — O09513 Supervision of elderly primigravida, third trimester: Secondary | ICD-10-CM | POA: Diagnosis not present

## 2015-06-18 ENCOUNTER — Other Ambulatory Visit: Payer: Self-pay | Admitting: Obstetrics and Gynecology

## 2015-06-19 ENCOUNTER — Ambulatory Visit (INDEPENDENT_AMBULATORY_CARE_PROVIDER_SITE_OTHER): Payer: Medicare Other | Admitting: Obstetrics & Gynecology

## 2015-06-19 ENCOUNTER — Encounter: Payer: Self-pay | Admitting: Obstetrics & Gynecology

## 2015-06-19 VITALS — BP 128/80 | HR 84 | Wt 242.6 lb

## 2015-06-19 DIAGNOSIS — O99350 Diseases of the nervous system complicating pregnancy, unspecified trimester: Secondary | ICD-10-CM

## 2015-06-19 DIAGNOSIS — O10913 Unspecified pre-existing hypertension complicating pregnancy, third trimester: Secondary | ICD-10-CM

## 2015-06-19 DIAGNOSIS — O2343 Unspecified infection of urinary tract in pregnancy, third trimester: Secondary | ICD-10-CM

## 2015-06-19 DIAGNOSIS — O365931 Maternal care for other known or suspected poor fetal growth, third trimester, fetus 1: Secondary | ICD-10-CM

## 2015-06-19 DIAGNOSIS — O0993 Supervision of high risk pregnancy, unspecified, third trimester: Secondary | ICD-10-CM

## 2015-06-19 DIAGNOSIS — O99353 Diseases of the nervous system complicating pregnancy, third trimester: Secondary | ICD-10-CM

## 2015-06-19 DIAGNOSIS — G35 Multiple sclerosis: Secondary | ICD-10-CM

## 2015-06-19 LAB — POCT URINALYSIS DIP (DEVICE)
GLUCOSE, UA: NEGATIVE mg/dL
KETONES UR: NEGATIVE mg/dL
Nitrite: POSITIVE — AB
Protein, ur: 30 mg/dL — AB
SPECIFIC GRAVITY, URINE: 1.025 (ref 1.005–1.030)
Urobilinogen, UA: 0.2 mg/dL (ref 0.0–1.0)
pH: 6.5 (ref 5.0–8.0)

## 2015-06-19 MED ORDER — CEPHALEXIN 500 MG PO CAPS: 500.0000 mg | ORAL_CAPSULE | Freq: Four times a day (QID) | ORAL | Status: DC

## 2015-06-19 NOTE — Patient Instructions (Signed)
Return to clinic for any scheduled appointments or obstetric concerns, or go to MAU for evaluation  

## 2015-06-19 NOTE — Progress Notes (Signed)
Subjective:  Emily [name] is a 35 y.o. G1P0 at [redacted]w[redacted]d being seen today for ongoing prenatal care.  She is currently monitored for the following issues for this high-risk pregnancy and has Supervision of high-risk pregnancy; Depression; Multiple sclerosis ([place]); Migraine; Seizure disorder ([place]); Smoker; Obesity; Obesity complicating pregnancy, childbirth, or puerperium, antepartum; Tobacco smoking affecting pregnancy, antepartum; AMA (advanced maternal age) primigravida 85+; HTN (hypertension); Preexisting hypertension complicating pregnancy, antepartum; Perforated bowel ([place]); Substance abuse affecting pregnancy, antepartum; Migraine with aura and without status migrainosus, not intractable; Generalized anxiety disorder; Right optic neuritis; Difficult intravenous access; Multiple sclerosis affecting pregnancy, antepartum ([place]); Poor fetal growth, affecting management of mother, antepartum condition or complication; PTSD (post-traumatic stress disorder); Conversion disorder with seizures or convulsions; Severe episode of recurrent major depressive disorder, without psychotic features ([place] [address]); and GAD (generalized anxiety disorder) on her problem list.  Patient reports UTI symptoms.  Contractions: Not present. Vag. Bleeding: None.  Movement: Present. Denies leaking of fluid.   The following portions of the patient's history were reviewed and updated as appropriate: allergies, current medications, past family history, past medical history, past social history, past surgical history and problem list. Problem list updated.  Objective:   Filed Vitals:   06/19/15 1024  BP: 128/80  Pulse: 84  Weight: 242 lb 9.6 oz (110.043 kg)    Fetal Status: Fetal Heart Rate (bpm): 148 Fundal Height: 28 cm Movement: Present     General:  Alert, oriented and cooperative. Patient is in no acute distress.  Skin: Skin is warm and dry. No rash noted.   Cardiovascular: Normal heart rate noted  Respiratory: Normal  respiratory effort, no problems with respiration noted  Abdomen: Soft, gravid, appropriate for gestational age. Pain/Pressure: Present     Pelvic: Cervical exam deferred        Extremities: Normal range of motion.  Edema: Trace  Mental Status: Normal mood and affect. Normal behavior. Normal judgment and thought content.   Urinalysis: Urine Protein: 1+ Urine Glucose: Negative Results for orders placed or performed in visit on 06/19/15 (from the past 24 hour(s))  POCT urinalysis dip (device)     Status: Abnormal   Collection Time: 06/19/15  9:48 AM  Result Value Ref Range   Glucose, UA NEGATIVE NEGATIVE mg/dL   Bilirubin Urine SMALL (A) NEGATIVE   Ketones, ur NEGATIVE NEGATIVE mg/dL   Specific Gravity, Urine 1.025 1.005 - 1.030   Hgb urine dipstick TRACE (A) NEGATIVE   pH 6.5 5.0 - 8.0   Protein, ur 30 (A) NEGATIVE mg/dL   Urobilinogen, UA 0.2 0.0 - 1.0 mg/dL   Nitrite POSITIVE (A) NEGATIVE   Leukocytes, UA TRACE (A) NEGATIVE    Assessment and Plan:  Pregnancy: G1P0 at [redacted]w[redacted]d  1. Preexisting hypertension complicating pregnancy, antepartum, third trimester Stable BP.  Continue Labetalol and antenatal testing  2. Poor fetal growth, affecting management of mother, antepartum condition or complication, third trimester, fetus 1 Weekly BPP and UA dopplers as per MFM. Will follow up recommendations. Already had BMZ regimen.  3. UTI (urinary tract infection) in pregnancy in third trimester UTI symptoms present. Keflex ordered. - POCT urinalysis dip (device) - Culture, OB Urine - cephALEXin (KEFLEX) 500 MG capsule; Take 1 capsule (500 mg total) by mouth 4 (four) times daily.  Dispense: 28 capsule; Refill: 2  4. Multiple sclerosis affecting pregnancy, antepartum (HCC) Stable  5. Supervision of high-risk pregnancy, third trimester Preterm labor symptoms and general obstetric precautions including but not limited to vaginal bleeding, contractions, leaking of fluid  and fetal movement were  reviewed in detail with the patient. Please refer to After Visit Summary for other counseling recommendations.  Return in about 2 weeks (around 07/03/2015) for OB Visit.   Osborne Oman, MD

## 2015-06-20 ENCOUNTER — Other Ambulatory Visit (HOSPITAL_COMMUNITY): Payer: Self-pay | Admitting: Maternal and Fetal Medicine

## 2015-06-20 ENCOUNTER — Encounter (HOSPITAL_COMMUNITY): Payer: Self-pay

## 2015-06-20 ENCOUNTER — Ambulatory Visit (HOSPITAL_COMMUNITY)
Admission: RE | Admit: 2015-06-20 | Discharge: 2015-06-20 | Disposition: A | Discharge status: Home / Self Care | Payer: Medicare Other | Source: Ambulatory Visit | Attending: Obstetrics and Gynecology | Admitting: Obstetrics and Gynecology

## 2015-06-20 ENCOUNTER — Other Ambulatory Visit (HOSPITAL_COMMUNITY): Payer: Self-pay | Admitting: Obstetrics and Gynecology

## 2015-06-20 VITALS — BP 124/91 | HR 95 | Wt 247.8 lb

## 2015-06-20 DIAGNOSIS — O99350 Diseases of the nervous system complicating pregnancy, unspecified trimester: Secondary | ICD-10-CM

## 2015-06-20 DIAGNOSIS — O10919 Unspecified pre-existing hypertension complicating pregnancy, unspecified trimester: Secondary | ICD-10-CM

## 2015-06-20 DIAGNOSIS — IMO0002 Reserved for concepts with insufficient information to code with codable children: Secondary | ICD-10-CM

## 2015-06-20 DIAGNOSIS — O10913 Unspecified pre-existing hypertension complicating pregnancy, third trimester: Secondary | ICD-10-CM

## 2015-06-20 DIAGNOSIS — O09513 Supervision of elderly primigravida, third trimester: Secondary | ICD-10-CM

## 2015-06-20 DIAGNOSIS — O365930 Maternal care for other known or suspected poor fetal growth, third trimester, not applicable or unspecified: Secondary | ICD-10-CM

## 2015-06-20 DIAGNOSIS — O99323 Drug use complicating pregnancy, third trimester: Secondary | ICD-10-CM | POA: Diagnosis not present

## 2015-06-20 DIAGNOSIS — Z3A32 32 weeks gestation of pregnancy: Secondary | ICD-10-CM

## 2015-06-20 DIAGNOSIS — O99213 Obesity complicating pregnancy, third trimester: Secondary | ICD-10-CM

## 2015-06-20 DIAGNOSIS — O0993 Supervision of high risk pregnancy, unspecified, third trimester: Secondary | ICD-10-CM

## 2015-06-20 DIAGNOSIS — O99333 Smoking (tobacco) complicating pregnancy, third trimester: Secondary | ICD-10-CM

## 2015-06-20 DIAGNOSIS — G35 Multiple sclerosis: Secondary | ICD-10-CM

## 2015-06-20 DIAGNOSIS — O10013 Pre-existing essential hypertension complicating pregnancy, third trimester: Secondary | ICD-10-CM | POA: Insufficient documentation

## 2015-06-20 DIAGNOSIS — O10019 Pre-existing essential hypertension complicating pregnancy, unspecified trimester: Secondary | ICD-10-CM | POA: Diagnosis not present

## 2015-06-20 DIAGNOSIS — O99353 Diseases of the nervous system complicating pregnancy, third trimester: Secondary | ICD-10-CM | POA: Diagnosis not present

## 2015-06-20 DIAGNOSIS — O99320 Drug use complicating pregnancy, unspecified trimester: Secondary | ICD-10-CM

## 2015-06-28 ENCOUNTER — Other Ambulatory Visit: Payer: Self-pay | Admitting: Family Medicine

## 2015-06-29 ENCOUNTER — Encounter (HOSPITAL_COMMUNITY): Payer: Self-pay

## 2015-06-29 ENCOUNTER — Ambulatory Visit (HOSPITAL_COMMUNITY)
Admission: RE | Admit: 2015-06-29 | Discharge: 2015-06-29 | Disposition: A | Discharge status: Home / Self Care | Payer: Medicare Other | Source: Ambulatory Visit | Attending: Obstetrics and Gynecology | Admitting: Obstetrics and Gynecology

## 2015-06-29 VITALS — BP 130/97 | HR 96 | Wt 248.0 lb

## 2015-06-29 DIAGNOSIS — O10913 Unspecified pre-existing hypertension complicating pregnancy, third trimester: Secondary | ICD-10-CM

## 2015-06-29 DIAGNOSIS — O0993 Supervision of high risk pregnancy, unspecified, third trimester: Secondary | ICD-10-CM

## 2015-06-29 DIAGNOSIS — O99323 Drug use complicating pregnancy, third trimester: Secondary | ICD-10-CM | POA: Insufficient documentation

## 2015-06-29 DIAGNOSIS — O10013 Pre-existing essential hypertension complicating pregnancy, third trimester: Secondary | ICD-10-CM | POA: Diagnosis not present

## 2015-06-29 DIAGNOSIS — IMO0002 Reserved for concepts with insufficient information to code with codable children: Secondary | ICD-10-CM

## 2015-06-29 DIAGNOSIS — O99320 Drug use complicating pregnancy, unspecified trimester: Secondary | ICD-10-CM

## 2015-06-29 DIAGNOSIS — O09513 Supervision of elderly primigravida, third trimester: Secondary | ICD-10-CM | POA: Diagnosis not present

## 2015-06-29 DIAGNOSIS — O99350 Diseases of the nervous system complicating pregnancy, unspecified trimester: Secondary | ICD-10-CM

## 2015-06-29 DIAGNOSIS — G35 Multiple sclerosis: Secondary | ICD-10-CM

## 2015-06-29 DIAGNOSIS — O99353 Diseases of the nervous system complicating pregnancy, third trimester: Secondary | ICD-10-CM | POA: Diagnosis not present

## 2015-06-29 DIAGNOSIS — O365930 Maternal care for other known or suspected poor fetal growth, third trimester, not applicable or unspecified: Secondary | ICD-10-CM | POA: Diagnosis not present

## 2015-06-29 DIAGNOSIS — O99213 Obesity complicating pregnancy, third trimester: Secondary | ICD-10-CM | POA: Diagnosis not present

## 2015-06-29 DIAGNOSIS — Z3A33 33 weeks gestation of pregnancy: Secondary | ICD-10-CM | POA: Diagnosis not present

## 2015-06-29 DIAGNOSIS — O99333 Smoking (tobacco) complicating pregnancy, third trimester: Secondary | ICD-10-CM | POA: Diagnosis not present

## 2015-07-03 ENCOUNTER — Encounter: Payer: Self-pay | Admitting: Obstetrics and Gynecology

## 2015-07-03 ENCOUNTER — Ambulatory Visit (INDEPENDENT_AMBULATORY_CARE_PROVIDER_SITE_OTHER): Payer: Medicare Other | Admitting: Obstetrics and Gynecology

## 2015-07-03 VITALS — BP 124/99 | HR 103 | Wt 248.6 lb

## 2015-07-03 DIAGNOSIS — O99350 Diseases of the nervous system complicating pregnancy, unspecified trimester: Secondary | ICD-10-CM

## 2015-07-03 DIAGNOSIS — G35 Multiple sclerosis: Secondary | ICD-10-CM | POA: Diagnosis not present

## 2015-07-03 DIAGNOSIS — F1910 Other psychoactive substance abuse, uncomplicated: Secondary | ICD-10-CM

## 2015-07-03 DIAGNOSIS — O09513 Supervision of elderly primigravida, third trimester: Secondary | ICD-10-CM | POA: Diagnosis present

## 2015-07-03 DIAGNOSIS — O99213 Obesity complicating pregnancy, third trimester: Secondary | ICD-10-CM

## 2015-07-03 DIAGNOSIS — I10 Essential (primary) hypertension: Secondary | ICD-10-CM | POA: Diagnosis not present

## 2015-07-03 DIAGNOSIS — E669 Obesity, unspecified: Secondary | ICD-10-CM | POA: Diagnosis not present

## 2015-07-03 DIAGNOSIS — O0993 Supervision of high risk pregnancy, unspecified, third trimester: Secondary | ICD-10-CM

## 2015-07-03 DIAGNOSIS — O99320 Drug use complicating pregnancy, unspecified trimester: Secondary | ICD-10-CM

## 2015-07-03 DIAGNOSIS — O99353 Diseases of the nervous system complicating pregnancy, third trimester: Secondary | ICD-10-CM | POA: Diagnosis not present

## 2015-07-03 DIAGNOSIS — O10913 Unspecified pre-existing hypertension complicating pregnancy, third trimester: Secondary | ICD-10-CM | POA: Diagnosis not present

## 2015-07-03 LAB — COMPREHENSIVE METABOLIC PANEL
ALT: 11 U/L (ref 6–29)
AST: 12 U/L (ref 10–30)
Albumin: 3.3 g/dL — ABNORMAL LOW (ref 3.6–5.1)
Alkaline Phosphatase: 149 U/L — ABNORMAL HIGH (ref 33–115)
BILIRUBIN TOTAL: 0.3 mg/dL (ref 0.2–1.2)
BUN: 9 mg/dL (ref 7–25)
CALCIUM: 9.1 mg/dL (ref 8.6–10.2)
CHLORIDE: 103 mmol/L (ref 98–110)
CO2: 22 mmol/L (ref 20–31)
Creat: 0.61 mg/dL (ref 0.50–1.10)
GLUCOSE: 83 mg/dL (ref 65–99)
Potassium: 4.6 mmol/L (ref 3.5–5.3)
SODIUM: 136 mmol/L (ref 135–146)
TOTAL PROTEIN: 6.2 g/dL (ref 6.1–8.1)

## 2015-07-03 LAB — POCT URINALYSIS DIP (DEVICE)
BILIRUBIN URINE: NEGATIVE
GLUCOSE, UA: NEGATIVE mg/dL
Ketones, ur: NEGATIVE mg/dL
LEUKOCYTES UA: NEGATIVE
NITRITE: POSITIVE — AB
Protein, ur: 30 mg/dL — AB
Specific Gravity, Urine: 1.02 (ref 1.005–1.030)
UROBILINOGEN UA: 0.2 mg/dL (ref 0.0–1.0)
pH: 6 (ref 5.0–8.0)

## 2015-07-03 LAB — CBC
HCT: 42.7 % (ref 35.0–45.0)
HEMOGLOBIN: 14.8 g/dL (ref 11.7–15.5)
MCH: 30.7 pg (ref 27.0–33.0)
MCHC: 34.7 g/dL (ref 32.0–36.0)
MCV: 88.6 fL (ref 80.0–100.0)
MPV: 10.9 fL (ref 7.5–12.5)
PLATELETS: 337 10*3/uL (ref 140–400)
RBC: 4.82 MIL/uL (ref 3.80–5.10)
RDW: 14.4 % (ref 11.0–15.0)
WBC: 15.4 10*3/uL — AB (ref 3.8–10.8)

## 2015-07-03 NOTE — Progress Notes (Signed)
Subjective:  Emily [name] is a 35 y.o. G1P0 at [redacted]w[redacted]d being seen today for ongoing prenatal care.  She is currently monitored for the following issues for this high-risk pregnancy and has Supervision of high-risk pregnancy; Depression; Multiple sclerosis ([place]); Migraine; Seizure disorder ([place]); Smoker; Obesity; Obesity complicating pregnancy, childbirth, or puerperium, antepartum; Tobacco smoking affecting pregnancy, antepartum; AMA (advanced maternal age) primigravida 56+; HTN (hypertension); Preexisting hypertension complicating pregnancy, antepartum; Perforated bowel ([place]); Substance abuse affecting pregnancy, antepartum; Migraine with aura and without status migrainosus, not intractable; Generalized anxiety disorder; Right optic neuritis; Difficult intravenous access; Multiple sclerosis affecting pregnancy, antepartum ([place]); Poor fetal growth, affecting management of mother, antepartum condition or complication; PTSD (post-traumatic stress disorder); Conversion disorder with seizures or convulsions; Severe episode of recurrent major depressive disorder, without psychotic features ([place]); and GAD (generalized anxiety disorder) on her problem list.  Patient reports no complaints.  Contractions: Irritability. Vag. Bleeding: None.  Movement: Present. Denies leaking of fluid.   The following portions of the patient's history were reviewed and updated as appropriate: allergies, current medications, past family history, past medical history, past social history, past surgical history and problem list. Problem list updated.  Objective:   Filed Vitals:   07/03/15 0857  BP: 124/99  Pulse: 103  Weight: 248 lb 9.6 oz (112.764 kg)    Fetal Status: Fetal Heart Rate (bpm): 167 Fundal Height: 30 cm Movement: Present     General:  Alert, oriented and cooperative. Patient is in no acute distress.  Skin: Skin is warm and dry. No rash noted.   Cardiovascular: Normal heart rate noted  Respiratory: Normal  respiratory effort, no problems with respiration noted  Abdomen: Soft, gravid, appropriate for gestational age. Pain/Pressure: Present     Pelvic: Cervical exam deferred        Extremities: Normal range of motion.  Edema: Trace  Mental Status: Normal mood and affect. Normal behavior. Normal judgment and thought content.   Urinalysis: Urine Protein: 1+ Urine Glucose: Negative  Assessment and Plan:  Pregnancy: G1P0 at [redacted]w[redacted]d  1. AMA (advanced maternal age) primigravida 79+, third trimester   2. Preexisting hypertension complicating pregnancy, antepartum, third trimester Elevated diastolic today. Patient denies headaches, visual disturbances, RUQ/epigastric pain Will obtain labs today Continue labetalol  3. Essential hypertension - CBC - Comprehensive metabolic panel - Protein / creatinine ratio, urine  4. Multiple sclerosis affecting pregnancy, antepartum ([place]) Feels that she is having a flare up with tingling in her hand. Discussed possibility of carpal tunnel syndrome but patient states that it is similar to her regular flares.Advised to follow up with Neuro  5. Supervision of high-risk pregnancy, third trimester Continue fetal testing with MFM. Growth scan already scheduled  6. Obesity complicating pregnancy, childbirth, or puerperium, antepartum, third trimester  Preeclampsia signs and symptoms reviewed with the patient Preterm labor symptoms and general obstetric precautions including but not limited to vaginal bleeding, contractions, leaking of fluid and fetal movement were reviewed in detail with the patient. Please refer to After Visit Summary for other counseling recommendations.  Return in about 2 weeks (around 07/17/2015).   Mora Bellman, MD

## 2015-07-04 LAB — PROTEIN / CREATININE RATIO, URINE
CREATININE, URINE: 300 mg/dL (ref 20–320)
Protein Creatinine Ratio: 93 mg/g creat (ref 21–161)
TOTAL PROTEIN, URINE: 28 mg/dL — AB (ref 5–24)

## 2015-07-06 ENCOUNTER — Inpatient Hospital Stay (HOSPITAL_COMMUNITY)
Admission: AD | Admit: 2015-07-06 | Discharge: 2015-07-06 | Disposition: A | Discharge status: Home / Self Care | Payer: Medicare Other | Source: Ambulatory Visit | Attending: Family Medicine | Admitting: Family Medicine

## 2015-07-06 ENCOUNTER — Telehealth: Payer: Self-pay | Admitting: Neurology

## 2015-07-06 ENCOUNTER — Ambulatory Visit (HOSPITAL_COMMUNITY)
Admission: RE | Admit: 2015-07-06 | Discharge: 2015-07-06 | Disposition: A | Discharge status: Home / Self Care | Payer: Medicare Other | Source: Ambulatory Visit | Attending: Obstetrics and Gynecology | Admitting: Obstetrics and Gynecology

## 2015-07-06 ENCOUNTER — Encounter (HOSPITAL_COMMUNITY): Payer: Self-pay

## 2015-07-06 ENCOUNTER — Encounter (HOSPITAL_COMMUNITY): Payer: Self-pay | Admitting: *Deleted

## 2015-07-06 DIAGNOSIS — R202 Paresthesia of skin: Secondary | ICD-10-CM

## 2015-07-06 DIAGNOSIS — O99343 Other mental disorders complicating pregnancy, third trimester: Secondary | ICD-10-CM | POA: Diagnosis not present

## 2015-07-06 DIAGNOSIS — N390 Urinary tract infection, site not specified: Secondary | ICD-10-CM | POA: Diagnosis not present

## 2015-07-06 DIAGNOSIS — O365930 Maternal care for other known or suspected poor fetal growth, third trimester, not applicable or unspecified: Secondary | ICD-10-CM

## 2015-07-06 DIAGNOSIS — J45909 Unspecified asthma, uncomplicated: Secondary | ICD-10-CM | POA: Insufficient documentation

## 2015-07-06 DIAGNOSIS — O99333 Smoking (tobacco) complicating pregnancy, third trimester: Secondary | ICD-10-CM | POA: Insufficient documentation

## 2015-07-06 DIAGNOSIS — F329 Major depressive disorder, single episode, unspecified: Secondary | ICD-10-CM | POA: Diagnosis not present

## 2015-07-06 DIAGNOSIS — O163 Unspecified maternal hypertension, third trimester: Secondary | ICD-10-CM | POA: Insufficient documentation

## 2015-07-06 DIAGNOSIS — G35 Multiple sclerosis: Secondary | ICD-10-CM

## 2015-07-06 DIAGNOSIS — O99513 Diseases of the respiratory system complicating pregnancy, third trimester: Secondary | ICD-10-CM | POA: Diagnosis not present

## 2015-07-06 DIAGNOSIS — Z3A34 34 weeks gestation of pregnancy: Secondary | ICD-10-CM | POA: Insufficient documentation

## 2015-07-06 DIAGNOSIS — O99213 Obesity complicating pregnancy, third trimester: Secondary | ICD-10-CM

## 2015-07-06 DIAGNOSIS — O10013 Pre-existing essential hypertension complicating pregnancy, third trimester: Secondary | ICD-10-CM

## 2015-07-06 DIAGNOSIS — K219 Gastro-esophageal reflux disease without esophagitis: Secondary | ICD-10-CM | POA: Insufficient documentation

## 2015-07-06 DIAGNOSIS — O09513 Supervision of elderly primigravida, third trimester: Secondary | ICD-10-CM

## 2015-07-06 DIAGNOSIS — O2343 Unspecified infection of urinary tract in pregnancy, third trimester: Secondary | ICD-10-CM | POA: Insufficient documentation

## 2015-07-06 DIAGNOSIS — IMO0002 Reserved for concepts with insufficient information to code with codable children: Secondary | ICD-10-CM

## 2015-07-06 DIAGNOSIS — F4310 Post-traumatic stress disorder, unspecified: Secondary | ICD-10-CM | POA: Diagnosis not present

## 2015-07-06 DIAGNOSIS — O99613 Diseases of the digestive system complicating pregnancy, third trimester: Secondary | ICD-10-CM | POA: Diagnosis not present

## 2015-07-06 DIAGNOSIS — O99353 Diseases of the nervous system complicating pregnancy, third trimester: Secondary | ICD-10-CM

## 2015-07-06 DIAGNOSIS — Z885 Allergy status to narcotic agent status: Secondary | ICD-10-CM | POA: Diagnosis not present

## 2015-07-06 DIAGNOSIS — F17210 Nicotine dependence, cigarettes, uncomplicated: Secondary | ICD-10-CM | POA: Insufficient documentation

## 2015-07-06 DIAGNOSIS — O0993 Supervision of high risk pregnancy, unspecified, third trimester: Secondary | ICD-10-CM

## 2015-07-06 DIAGNOSIS — O10913 Unspecified pre-existing hypertension complicating pregnancy, third trimester: Secondary | ICD-10-CM

## 2015-07-06 DIAGNOSIS — O99320 Drug use complicating pregnancy, unspecified trimester: Secondary | ICD-10-CM

## 2015-07-06 DIAGNOSIS — O99350 Diseases of the nervous system complicating pregnancy, unspecified trimester: Secondary | ICD-10-CM

## 2015-07-06 LAB — CBC WITH DIFFERENTIAL/PLATELET
Basophils Absolute: 0 10*3/uL (ref 0.0–0.1)
Basophils Relative: 0 %
EOS PCT: 1 %
Eosinophils Absolute: 0.1 10*3/uL (ref 0.0–0.7)
HEMATOCRIT: 39.3 % (ref 36.0–46.0)
HEMOGLOBIN: 13.8 g/dL (ref 12.0–15.0)
LYMPHS ABS: 2.7 10*3/uL (ref 0.7–4.0)
LYMPHS PCT: 16 %
MCH: 30.7 pg (ref 26.0–34.0)
MCHC: 35.1 g/dL (ref 30.0–36.0)
MCV: 87.5 fL (ref 78.0–100.0)
MONO ABS: 0.6 10*3/uL (ref 0.1–1.0)
MONOS PCT: 4 %
Neutro Abs: 13.6 10*3/uL — ABNORMAL HIGH (ref 1.7–7.7)
Neutrophils Relative %: 79 %
Platelets: 299 10*3/uL (ref 150–400)
RBC: 4.49 MIL/uL (ref 3.87–5.11)
RDW: 13.9 % (ref 11.5–15.5)
WBC: 17 10*3/uL — ABNORMAL HIGH (ref 4.0–10.5)

## 2015-07-06 LAB — COMPREHENSIVE METABOLIC PANEL
ALBUMIN: 3 g/dL — AB (ref 3.5–5.0)
ALK PHOS: 134 U/L — AB (ref 38–126)
ALT: 12 U/L — ABNORMAL LOW (ref 14–54)
ANION GAP: 11 (ref 5–15)
AST: 18 U/L (ref 15–41)
BILIRUBIN TOTAL: 0.3 mg/dL (ref 0.3–1.2)
BUN: 10 mg/dL (ref 6–20)
CHLORIDE: 103 mmol/L (ref 101–111)
CO2: 20 mmol/L — AB (ref 22–32)
CREATININE: 0.67 mg/dL (ref 0.44–1.00)
Calcium: 9.2 mg/dL (ref 8.9–10.3)
Glucose, Bld: 76 mg/dL (ref 65–99)
POTASSIUM: 4.3 mmol/L (ref 3.5–5.1)
Sodium: 134 mmol/L — ABNORMAL LOW (ref 135–145)
Total Protein: 6.9 g/dL (ref 6.5–8.1)

## 2015-07-06 LAB — URINALYSIS, ROUTINE W REFLEX MICROSCOPIC
BILIRUBIN URINE: NEGATIVE
Glucose, UA: NEGATIVE mg/dL
KETONES UR: NEGATIVE mg/dL
NITRITE: NEGATIVE
Protein, ur: NEGATIVE mg/dL
Specific Gravity, Urine: 1.02 (ref 1.005–1.030)
pH: 6 (ref 5.0–8.0)

## 2015-07-06 LAB — PROTEIN / CREATININE RATIO, URINE
CREATININE, URINE: 134 mg/dL
PROTEIN CREATININE RATIO: 0.08 mg/mg{creat} (ref 0.00–0.15)
TOTAL PROTEIN, URINE: 11 mg/dL

## 2015-07-06 LAB — URINE MICROSCOPIC-ADD ON

## 2015-07-06 MED ORDER — CEPHALEXIN 500 MG PO CAPS: 500.0000 mg | ORAL_CAPSULE | Freq: Four times a day (QID) | ORAL | Status: DC

## 2015-07-06 MED ORDER — LABETALOL HCL 200 MG PO TABS: 400.0000 mg | ORAL_TABLET | Freq: Two times a day (BID) | ORAL | Status: DC

## 2015-07-06 NOTE — MAU Provider Note (Signed)
History     CSN: [id number]  Arrival date and time: 07/06/15 1222   First Provider Initiated Contact with Patient 07/06/15 1343      Chief Complaint  Patient presents with  . Multiple Sclerosis   HPI [name] is a 35 y.o. G1P0 at [redacted]w[redacted]d who presents to MAU today with complaint of cramping and numbness in hands. The patient has CHTN and MS. She states that she has started to notice a flare in her MS symptoms recently. She states intermittent numbness and tingling in both hands with mild swelling. She also states recent hearing loss right worse than left. She was told in clinic to call her Neurologist. They told her she might have Carpal tunnel and were unable to see her. Per patient, if she could not be seen by Neuro she was to present to MAU for further evaluation and management. She is taking Labetalol for CHTN BID, last dose this morning. She denies headache, blurred vision, floaters, vaginal bleeding or LOF. She reports lower abdominal cramping and irregular contractions. She reports normal fetal movement.   OB History    Gravida Para Term Preterm AB TAB SAB Ectopic Multiple Living   1         0      Past Medical History  Diagnosis Date  . Headache(784.0)     migraines  . Multiple sclerosis ([place])   . Asthma as a child  . GERD (gastroesophageal reflux disease)   . Urinary urgency   . Anxiety   . Perforated bowel ([place]) 2009  . Migraines   . Depression   . Chronic back pain   . Chronic pain   . Ovarian cyst   . Panic attack   . Pseudoseizures   . Conversion disorder   . JC virus antibody positive   . PTSD (post-traumatic stress disorder)   . HTN (hypertension) 02/27/2015  . Pregnant and not yet delivered     due Aug 15, 2015    Past Surgical History  Procedure Laterality Date  . Extremity cyst excision  1994    right leg  . 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 Colostomy closure  04/2007  . Scar revision  01/21/2011    Procedure: SCAR REVISION;   Surgeon: Hermelinda Dellen;  Location: [facility];  Service: Plastics;  Laterality: N/A;  exploration of scar of abdomen and repair of defect  . Hernia repair    . Abdominal surgery    . Appendectomy      Family History  Problem Relation Age of Onset  . Diabetes Mother   . Hypertension Mother   . Diabetes Father   . Hypertension Father   . Arthritis Father   . Cancer Maternal Grandmother   . Cancer Maternal Grandfather   . Alcohol abuse Neg Hx   . Anxiety disorder Neg Hx   . Bipolar disorder Neg Hx   . Drug abuse Neg Hx   . Depression Neg Hx     Social History  Substance Use Topics  . Smoking status: Current Every Day Smoker -- 0.25 packs/day for 10 years    Types: Cigarettes  . Smokeless tobacco: Never Used  . Alcohol Use: No     Comment: Rare    Allergies:  Allergies  Allergen Reactions  . Amitriptyline Hypertension and Other (See Comments)    hypertension  . Baclofen Hives and Shortness Of Breath  . Cymbalta [Duloxetine Hcl] Shortness Of  Breath and Rash  . Gabapentin Shortness Of Breath and Rash  . Monosodium Glutamate Anaphylaxis  . Other Shortness Of Breath and Rash    MSG, beans (vomiting)  . Vicodin [Hydrocodone-Acetaminophen] Hives and Nausea And Vomiting    Projectile vomiting  . Alprazolam Other (See Comments)    Lethargy  . Magnesium Salicylate Hives and Itching  . Rizatriptan Nausea And Vomiting and Other (See Comments)    GI upset, Projectile vomiting  . Tizanidine Hives  . Tramadol Other (See Comments)    Unspecified  . Adhesive [Tape] Other (See Comments)    Skin irritation  . Lamotrigine Rash  . Toradol [Ketorolac Tromethamine] Nausea Only    No prescriptions prior to admission    Review of Systems  Constitutional: Negative for fever and malaise/fatigue.  Eyes: Negative for blurred vision.       Neg - floaters  Gastrointestinal: Positive for nausea, vomiting and abdominal pain. Negative for diarrhea and constipation.   Genitourinary: Negative for dysuria, urgency and frequency.       Neg - vaginal bleeding, LOF  Neurological: Positive for tingling and sensory change. Negative for headaches.   Physical Exam   Blood pressure 155/102, pulse 88, temperature 98.8 F (37.1 C), temperature source Oral, resp. rate 18, height 5\' 4"  (1.626 m), weight 250 lb 1.6 oz (113.445 kg), last menstrual period 11/08/2014.  Physical Exam  Nursing note and vitals reviewed. Constitutional: She is oriented to person, place, and time. She appears well-developed and well-nourished. No distress.  HENT:  Head: Normocephalic and atraumatic.  Right Ear: Decreased hearing is noted.  Left Ear: Hearing normal.  Cardiovascular: Normal rate.   Respiratory: Effort normal.  GI: Soft. She exhibits no distension and no mass. There is no tenderness. There is no rebound and no guarding.  Neurological: She is alert and oriented to person, place, and time.  Skin: Skin is warm and dry. No erythema.  Psychiatric: She has a normal mood and affect.  Dilation: Closed Effacement (%): Thick Exam by:: Arman Bogus RN  Results for orders placed or performed during the hospital encounter of 07/06/15 (from the past 24 hour(s))  Urinalysis, Routine w reflex microscopic (not at [facility])     Status: Abnormal   Collection Time: 07/06/15  1:05 PM  Result Value Ref Range   Color, Urine YELLOW YELLOW   APPearance CLEAR CLEAR   Specific Gravity, Urine 1.020 1.005 - 1.030   pH 6.0 5.0 - 8.0   Glucose, UA NEGATIVE NEGATIVE mg/dL   Hgb urine dipstick MODERATE (A) NEGATIVE   Bilirubin Urine NEGATIVE NEGATIVE   Ketones, ur NEGATIVE NEGATIVE mg/dL   Protein, ur NEGATIVE NEGATIVE mg/dL   Nitrite NEGATIVE NEGATIVE   Leukocytes, UA TRACE (A) NEGATIVE  Urine microscopic-add on     Status: Abnormal   Collection Time: 07/06/15  1:05 PM  Result Value Ref Range   Squamous Epithelial / LPF 0-5 (A) NONE SEEN   WBC, UA 0-5 0 - 5 WBC/hpf   RBC / HPF 0-5 0 - 5  RBC/hpf   Bacteria, UA MANY (A) NONE SEEN   Urine-Other MUCOUS PRESENT   Protein / creatinine ratio, urine     Status: None   Collection Time: 07/06/15  1:05 PM  Result Value Ref Range   Creatinine, Urine 134.00 mg/dL   Total Protein, Urine 11 mg/dL   Protein Creatinine Ratio 0.08 0.00 - 0.15 mg/mg[Cre]  CBC with Differential/Platelet     Status: Abnormal   Collection Time:  07/06/15  2:20 PM  Result Value Ref Range   WBC 17.0 (H) 4.0 - 10.5 K/uL   RBC 4.49 3.87 - 5.11 MIL/uL   Hemoglobin 13.8 12.0 - 15.0 g/dL   HCT 39.3 36.0 - 46.0 %   MCV 87.5 78.0 - 100.0 fL   MCH 30.7 26.0 - 34.0 pg   MCHC 35.1 30.0 - 36.0 g/dL   RDW 13.9 11.5 - 15.5 %   Platelets 299 150 - 400 K/uL   Neutrophils Relative % 79 %   Neutro Abs 13.6 (H) 1.7 - 7.7 K/uL   Lymphocytes Relative 16 %   Lymphs Abs 2.7 0.7 - 4.0 K/uL   Monocytes Relative 4 %   Monocytes Absolute 0.6 0.1 - 1.0 K/uL   Eosinophils Relative 1 %   Eosinophils Absolute 0.1 0.0 - 0.7 K/uL   Basophils Relative 0 %   Basophils Absolute 0.0 0.0 - 0.1 K/uL  Comprehensive metabolic panel     Status: Abnormal   Collection Time: 07/06/15  2:20 PM  Result Value Ref Range   Sodium 134 (L) 135 - 145 mmol/L   Potassium 4.3 3.5 - 5.1 mmol/L   Chloride 103 101 - 111 mmol/L   CO2 20 (L) 22 - 32 mmol/L   Glucose, Bld 76 65 - 99 mg/dL   BUN 10 6 - 20 mg/dL   Creatinine, Ser 0.67 0.44 - 1.00 mg/dL   Calcium 9.2 8.9 - 10.3 mg/dL   Total Protein 6.9 6.5 - 8.1 g/dL   Albumin 3.0 (L) 3.5 - 5.0 g/dL   AST 18 15 - 41 U/L   ALT 12 (L) 14 - 54 U/L   Alkaline Phosphatase 134 (H) 38 - 126 U/L   Total Bilirubin 0.3 0.3 - 1.2 mg/dL   GFR calc non Af Amer >60 >60 mL/min   GFR calc Af Amer >60 >60 mL/min   Anion gap 11 5 - 15   Fetal Monitoring: Baseline: 120 bpm Variability: moderate Accelerations: 15 x 15 Decelerations: none Contractions: none  MAU Course  Procedures None  MDM Serial BPs CBC, CMP, UA and Urine Protein/Creatinine Ratio Urine  culture  Discussed patient with Dr. Nehemiah Settle. Consult Neurology for management of hearing loss as possible MS flare.  Discussed with Dr. Carles Collet at [facility]. They will order outpatient MRI and call the patient with appointment. She states that this is unlikely a MS flare due to current pregnancy, but they will evaluate further and treat accordingly.  Discussed patient BP with Dr. Nehemiah Settle. No evidence of superimposed pre-eclampsia based on lab work. Patient is asymptomatic. He recommends increased Labetalol to 400 mg BID for CHTN.   Assessment and Plan  A: SIUP at [redacted]w[redacted]d UTI  MS CHTN Carpal Tunnel, bilateral  P: Discharge home Rx for Keflex given to patient  Increased dose of Labetalol discussed with patient and refill sent to her pharmacy Pre-eclampsia and preterm labor precautions discussed Patient advised to follow-up with [facility] as scheduled for routine prenatal care or sooner PRN Patient advised that she will receive a call from [facility] with appointment for MRI and they will manage depending on results Patient advised to get wrist splints for Carpal Tunnel Patient may return to MAU as needed or if her condition were to change or worsen  Luvenia Redden, PA-C  07/06/2015, 4:25 PM

## 2015-07-06 NOTE — Discharge Instructions (Signed)
Carpal Tunnel Syndrome Carpal tunnel syndrome is a condition that causes pain in your hand and arm. The carpal tunnel is a narrow area that is on the palm side of your wrist. Repeated wrist motion or certain diseases may cause swelling in the tunnel. This swelling can pinch the main nerve in the wrist (median nerve).  HOME CARE If You Have a Splint:  Wear it as told by your doctor. Remove it only as told by your doctor.  Loosen the splint if your fingers:  Become numb and tingle.  Turn blue and cold.  Keep the splint clean and dry. General Instructions  Take over-the-counter and prescription medicines only as told by your doctor.  Rest your wrist from any activity that may be causing your pain. If needed, talk to your employer about changes that can be made in your work, such as getting a wrist pad to use while typing.  If directed, apply ice to the painful area:  Put ice in a plastic bag.  Place a towel between your skin and the bag.  Leave the ice on for 20 minutes, 2-3 times per day.  Keep all follow-up visits as told by your doctor. This is important.  Do any exercises as told by your doctor, physical therapist, or occupational therapist. GET HELP IF:  You have new symptoms.  Medicine does not help your pain.  Your symptoms get worse.   This information is not intended to replace advice given to you by your health care provider. Make sure you discuss any questions you have with your health care provider.   Document Released: 12/13/2010 Document Revised: 09/14/2014 Document Reviewed: 05/11/2014 Elsevier Interactive Patient Education 2016 Elsevier Inc. Hypertension During Pregnancy Hypertension is also called high blood pressure. Blood pressure moves blood in your body. Sometimes, the force that moves the blood becomes too strong. When you are pregnant, this condition should be watched carefully. It can cause problems for you and your baby. HOME CARE   Make and keep  all of your doctor visits.  Take medicine as told by your doctor. Tell your doctor about all medicines you take.  Eat very little salt.  Exercise regularly.  Do not drink alcohol.  Do not smoke.  Do not have drinks with caffeine.  Lie on your left side when resting.  Your health care provider may ask you to take one low-dose aspirin (81mg ) each day. GET HELP RIGHT AWAY IF:  You have bad belly (abdominal) pain.  You have sudden puffiness (swelling) in the hands, ankles, or face.  You gain 4 pounds (1.8 kilograms) or more in 1 week.  You throw up (vomit) repeatedly.  You have bleeding from the vagina.  You do not feel the baby moving as much.  You have a headache.  You have blurred or double vision.  You have muscle twitching or spasms.  You have shortness of breath.  You have blue fingernails and lips.  You have blood in your pee (urine). MAKE SURE YOU:  Understand these instructions.  Will watch your condition.  Will get help right away if you are not doing well or get worse.   This information is not intended to replace advice given to you by your health care provider. Make sure you discuss any questions you have with your health care provider.   Document Released: 01/26/2010 Document Revised: 01/14/2014 Document Reviewed: 07/23/2012 Elsevier Interactive Patient Education [employer].

## 2015-07-06 NOTE — MAU Note (Signed)
Pt reports she is having increase in her MS. Hearing in her right is is gone and the right ear is less. C/O pain in her wrist. Nuerol[place] thought it maybe carple tunnel

## 2015-07-06 NOTE — Telephone Encounter (Signed)
[facility]  Pt presented with paresthesias and said when she called [place] in May she reported hearing loss.  Read notes directly to PA, hearing loss not reported.  Would be an unusual MS sx but happy to get MRI brain (without gad because pt pregnant) and have it compared to prior one to see if any new lesions.

## 2015-07-06 NOTE — MAU Note (Signed)
Pt given discharge instructions to return to MAU if ROM, decrease in fetal movement, vaginal bleeding, headache or any changes in vision, verbalized understanding

## 2015-07-07 LAB — CULTURE, OB URINE

## 2015-07-07 NOTE — Telephone Encounter (Signed)
[facility] making patient aware we are ordering MR. Order placed. [facility] to call patient to schedule. She is to call with any questions.

## 2015-07-10 ENCOUNTER — Other Ambulatory Visit: Payer: Self-pay | Admitting: General Practice

## 2015-07-10 DIAGNOSIS — O219 Vomiting of pregnancy, unspecified: Secondary | ICD-10-CM

## 2015-07-10 MED ORDER — PROMETHAZINE HCL 25 MG PO TABS: 12.5000 mg | ORAL_TABLET | Freq: Four times a day (QID) | ORAL | Status: DC | PRN

## 2015-07-12 ENCOUNTER — Inpatient Hospital Stay (HOSPITAL_COMMUNITY): Payer: Medicare Other | Admitting: Certified Registered Nurse Anesthetist

## 2015-07-12 ENCOUNTER — Inpatient Hospital Stay (HOSPITAL_COMMUNITY): Payer: Medicare Other

## 2015-07-12 ENCOUNTER — Encounter (HOSPITAL_COMMUNITY): Payer: Self-pay

## 2015-07-12 ENCOUNTER — Encounter (HOSPITAL_COMMUNITY)
Admission: AD | Disposition: A | Discharge status: Home / Self Care | Payer: Self-pay | Source: Ambulatory Visit | Attending: Obstetrics & Gynecology

## 2015-07-12 ENCOUNTER — Inpatient Hospital Stay (HOSPITAL_COMMUNITY)
Admission: AD | Admit: 2015-07-12 | Discharge: 2015-07-15 | DRG: 765 | Disposition: A | Discharge status: Home / Self Care | Payer: Medicare Other | Source: Ambulatory Visit | Attending: Obstetrics & Gynecology | Admitting: Obstetrics & Gynecology

## 2015-07-12 DIAGNOSIS — O365930 Maternal care for other known or suspected poor fetal growth, third trimester, not applicable or unspecified: Secondary | ICD-10-CM | POA: Diagnosis not present

## 2015-07-12 DIAGNOSIS — K660 Peritoneal adhesions (postprocedural) (postinfection): Secondary | ICD-10-CM | POA: Diagnosis present

## 2015-07-12 DIAGNOSIS — Z6841 Body Mass Index (BMI) 40.0 and over, adult: Secondary | ICD-10-CM

## 2015-07-12 DIAGNOSIS — Z888 Allergy status to other drugs, medicaments and biological substances status: Secondary | ICD-10-CM | POA: Diagnosis not present

## 2015-07-12 DIAGNOSIS — O4593 Premature separation of placenta, unspecified, third trimester: Principal | ICD-10-CM | POA: Diagnosis present

## 2015-07-12 DIAGNOSIS — O9962 Diseases of the digestive system complicating childbirth: Secondary | ICD-10-CM | POA: Diagnosis present

## 2015-07-12 DIAGNOSIS — Z7982 Long term (current) use of aspirin: Secondary | ICD-10-CM | POA: Diagnosis not present

## 2015-07-12 DIAGNOSIS — Z3A35 35 weeks gestation of pregnancy: Secondary | ICD-10-CM

## 2015-07-12 DIAGNOSIS — O99354 Diseases of the nervous system complicating childbirth: Secondary | ICD-10-CM | POA: Diagnosis present

## 2015-07-12 DIAGNOSIS — F339 Major depressive disorder, recurrent, unspecified: Secondary | ICD-10-CM | POA: Diagnosis present

## 2015-07-12 DIAGNOSIS — F411 Generalized anxiety disorder: Secondary | ICD-10-CM | POA: Diagnosis present

## 2015-07-12 DIAGNOSIS — F445 Conversion disorder with seizures or convulsions: Secondary | ICD-10-CM | POA: Diagnosis present

## 2015-07-12 DIAGNOSIS — O0993 Supervision of high risk pregnancy, unspecified, third trimester: Secondary | ICD-10-CM

## 2015-07-12 DIAGNOSIS — Z91018 Allergy to other foods: Secondary | ICD-10-CM | POA: Diagnosis not present

## 2015-07-12 DIAGNOSIS — O99320 Drug use complicating pregnancy, unspecified trimester: Secondary | ICD-10-CM

## 2015-07-12 DIAGNOSIS — F17210 Nicotine dependence, cigarettes, uncomplicated: Secondary | ICD-10-CM | POA: Diagnosis present

## 2015-07-12 DIAGNOSIS — R109 Unspecified abdominal pain: Secondary | ICD-10-CM | POA: Diagnosis not present

## 2015-07-12 DIAGNOSIS — O43893 Other placental disorders, third trimester: Secondary | ICD-10-CM | POA: Diagnosis not present

## 2015-07-12 DIAGNOSIS — O10913 Unspecified pre-existing hypertension complicating pregnancy, third trimester: Secondary | ICD-10-CM

## 2015-07-12 DIAGNOSIS — O1092 Unspecified pre-existing hypertension complicating childbirth: Secondary | ICD-10-CM | POA: Diagnosis present

## 2015-07-12 DIAGNOSIS — O99213 Obesity complicating pregnancy, third trimester: Secondary | ICD-10-CM

## 2015-07-12 DIAGNOSIS — K661 Hemoperitoneum: Secondary | ICD-10-CM | POA: Diagnosis present

## 2015-07-12 DIAGNOSIS — F4310 Post-traumatic stress disorder, unspecified: Secondary | ICD-10-CM | POA: Diagnosis present

## 2015-07-12 DIAGNOSIS — O99334 Smoking (tobacco) complicating childbirth: Secondary | ICD-10-CM | POA: Diagnosis present

## 2015-07-12 DIAGNOSIS — O26899 Other specified pregnancy related conditions, unspecified trimester: Secondary | ICD-10-CM

## 2015-07-12 DIAGNOSIS — Z79899 Other long term (current) drug therapy: Secondary | ICD-10-CM | POA: Diagnosis not present

## 2015-07-12 DIAGNOSIS — G35 Multiple sclerosis: Secondary | ICD-10-CM | POA: Diagnosis not present

## 2015-07-12 DIAGNOSIS — Z452 Encounter for adjustment and management of vascular access device: Secondary | ICD-10-CM

## 2015-07-12 DIAGNOSIS — Z91048 Other nonmedicinal substance allergy status: Secondary | ICD-10-CM | POA: Diagnosis not present

## 2015-07-12 DIAGNOSIS — O10013 Pre-existing essential hypertension complicating pregnancy, third trimester: Secondary | ICD-10-CM | POA: Diagnosis not present

## 2015-07-12 DIAGNOSIS — O99214 Obesity complicating childbirth: Secondary | ICD-10-CM | POA: Diagnosis present

## 2015-07-12 DIAGNOSIS — O09513 Supervision of elderly primigravida, third trimester: Secondary | ICD-10-CM | POA: Diagnosis not present

## 2015-07-12 DIAGNOSIS — O99353 Diseases of the nervous system complicating pregnancy, third trimester: Secondary | ICD-10-CM | POA: Diagnosis not present

## 2015-07-12 DIAGNOSIS — O99350 Diseases of the nervous system complicating pregnancy, unspecified trimester: Secondary | ICD-10-CM

## 2015-07-12 DIAGNOSIS — IMO0002 Reserved for concepts with insufficient information to code with codable children: Secondary | ICD-10-CM

## 2015-07-12 LAB — CBC
HCT: 37.2 % (ref 36.0–46.0)
HEMATOCRIT: 33 % — AB (ref 36.0–46.0)
HEMATOCRIT: 30.4 % — AB (ref 36.0–46.0)
HEMOGLOBIN: 13 g/dL (ref 12.0–15.0)
HEMOGLOBIN: 10.7 g/dL — AB (ref 12.0–15.0)
Hemoglobin: 11 g/dL — ABNORMAL LOW (ref 12.0–15.0)
MCH: 30.7 pg (ref 26.0–34.0)
MCH: 29.6 pg (ref 26.0–34.0)
MCH: 30.7 pg (ref 26.0–34.0)
MCHC: 34.9 g/dL (ref 30.0–36.0)
MCHC: 33.3 g/dL (ref 30.0–36.0)
MCHC: 35.2 g/dL (ref 30.0–36.0)
MCV: 87.9 fL (ref 78.0–100.0)
MCV: 88.7 fL (ref 78.0–100.0)
MCV: 87.4 fL (ref 78.0–100.0)
PLATELETS: 292 10*3/uL (ref 150–400)
PLATELETS: 272 10*3/uL (ref 150–400)
Platelets: 264 10*3/uL (ref 150–400)
RBC: 4.23 MIL/uL (ref 3.87–5.11)
RBC: 3.72 MIL/uL — ABNORMAL LOW (ref 3.87–5.11)
RBC: 3.48 MIL/uL — ABNORMAL LOW (ref 3.87–5.11)
RDW: 14 % (ref 11.5–15.5)
RDW: 14.1 % (ref 11.5–15.5)
RDW: 14.4 % (ref 11.5–15.5)
WBC: 19.7 10*3/uL — AB (ref 4.0–10.5)
WBC: 29.3 10*3/uL — ABNORMAL HIGH (ref 4.0–10.5)
WBC: 29.2 10*3/uL — AB (ref 4.0–10.5)

## 2015-07-12 LAB — URINALYSIS, ROUTINE W REFLEX MICROSCOPIC
BILIRUBIN URINE: NEGATIVE
GLUCOSE, UA: NEGATIVE mg/dL
KETONES UR: NEGATIVE mg/dL
Leukocytes, UA: NEGATIVE
NITRITE: NEGATIVE
PH: 6 (ref 5.0–8.0)
Protein, ur: >300 mg/dL — AB

## 2015-07-12 LAB — CBC WITH DIFFERENTIAL/PLATELET
BASOS ABS: 0 10*3/uL (ref 0.0–0.1)
Basophils Relative: 0 %
EOS PCT: 0 %
Eosinophils Absolute: 0.1 10*3/uL (ref 0.0–0.7)
HEMATOCRIT: 37.4 % (ref 36.0–46.0)
HEMOGLOBIN: 13 g/dL (ref 12.0–15.0)
LYMPHS ABS: 2.9 10*3/uL (ref 0.7–4.0)
LYMPHS PCT: 10 %
MCH: 30.4 pg (ref 26.0–34.0)
MCHC: 34.8 g/dL (ref 30.0–36.0)
MCV: 87.6 fL (ref 78.0–100.0)
Monocytes Absolute: 1 10*3/uL (ref 0.1–1.0)
Monocytes Relative: 3 %
NEUTROS PCT: 86 %
Neutro Abs: 25.6 10*3/uL — ABNORMAL HIGH (ref 1.7–7.7)
PLATELETS: 297 10*3/uL (ref 150–400)
RBC: 4.27 MIL/uL (ref 3.87–5.11)
RDW: 14.1 % (ref 11.5–15.5)
WBC: 29.6 10*3/uL — AB (ref 4.0–10.5)

## 2015-07-12 LAB — COMPREHENSIVE METABOLIC PANEL
ALBUMIN: 2.8 g/dL — AB (ref 3.5–5.0)
ALK PHOS: 139 U/L — AB (ref 38–126)
ALT: 11 U/L — AB (ref 14–54)
AST: 19 U/L (ref 15–41)
Anion gap: 9 (ref 5–15)
BUN: 18 mg/dL (ref 6–20)
CALCIUM: 8.6 mg/dL — AB (ref 8.9–10.3)
CHLORIDE: 107 mmol/L (ref 101–111)
CO2: 19 mmol/L — AB (ref 22–32)
CREATININE: 0.62 mg/dL (ref 0.44–1.00)
GFR calc Af Amer: >60 mL/min (ref >60)
GFR calc non Af Amer: >60 mL/min (ref >60)
GLUCOSE: 95 mg/dL (ref 65–99)
Potassium: 4.3 mmol/L (ref 3.5–5.1)
SODIUM: 135 mmol/L (ref 135–145)
Total Bilirubin: 0.7 mg/dL (ref 0.3–1.2)
Total Protein: 6.2 g/dL — ABNORMAL LOW (ref 6.5–8.1)

## 2015-07-12 LAB — RAPID URINE DRUG SCREEN, HOSP PERFORMED
Amphetamines: NOT DETECTED
BARBITURATES: NOT DETECTED
BENZODIAZEPINES: POSITIVE — AB
Barbiturates: NOT DETECTED
COCAINE: NOT DETECTED
COCAINE: NOT DETECTED
Opiates: POSITIVE — AB
Tetrahydrocannabinol: POSITIVE — AB

## 2015-07-12 LAB — PROTEIN / CREATININE RATIO, URINE
CREATININE, URINE: 292 mg/dL
Protein Creatinine Ratio: 1.73 mg/mg{Cre} — ABNORMAL HIGH (ref 0.00–0.15)
TOTAL PROTEIN, URINE: 505 mg/dL

## 2015-07-12 LAB — GLUCOSE, CAPILLARY: Glucose-Capillary: 100 mg/dL — ABNORMAL HIGH (ref 65–99)

## 2015-07-12 LAB — URINE MICROSCOPIC-ADD ON

## 2015-07-12 LAB — LIPASE, BLOOD: Lipase: 41 U/L (ref 11–51)

## 2015-07-12 SURGERY — Surgical Case
Anesthesia: Regional

## 2015-07-12 MED ORDER — HYDROMORPHONE HCL 1 MG/ML IJ SOLN
1.0000 mg | Freq: Once | INTRAMUSCULAR | Status: AC
  Administered 2015-07-12: 1 mg via INTRAVENOUS
  Filled 2015-07-12: qty 1

## 2015-07-12 MED ORDER — SENNOSIDES-DOCUSATE SODIUM 8.6-50 MG PO TABS
2.0000 | ORAL_TABLET | ORAL | Status: DC
  Administered 2015-07-12 – 2015-07-15 (×5): 2 via ORAL
  Filled 2015-07-12 (×5): qty 2

## 2015-07-12 MED ORDER — FLUOXETINE HCL 20 MG PO TABS
20.0000 mg | ORAL_TABLET | Freq: Every day | ORAL | Status: DC
  Administered 2015-07-12 – 2015-07-15 (×4): 20 mg via ORAL
  Filled 2015-07-12 (×5): qty 1

## 2015-07-12 MED ORDER — HYDRALAZINE HCL 20 MG/ML IJ SOLN
INTRAMUSCULAR | Status: AC
  Administered 2015-07-12 (×2): 10 mg
  Filled 2015-07-12: qty 1

## 2015-07-12 MED ORDER — FENTANYL CITRATE (PF) 100 MCG/2ML IJ SOLN
INTRAMUSCULAR | Status: AC
  Filled 2015-07-12: qty 2

## 2015-07-12 MED ORDER — HYDROMORPHONE 1 MG/ML IV SOLN
INTRAVENOUS | Status: DC
  Administered 2015-07-12: 1 mL via INTRAVENOUS
  Administered 2015-07-12: 0.8 mL via INTRAVENOUS
  Administered 2015-07-12: 1.6 mg via INTRAVENOUS
  Administered 2015-07-12: 12:00:00 via INTRAVENOUS
  Administered 2015-07-13: 1 mg via INTRAVENOUS
  Administered 2015-07-13: 0.8 mg via INTRAVENOUS
  Administered 2015-07-13: 2 mg via INTRAVENOUS
  Filled 2015-07-12: qty 25

## 2015-07-12 MED ORDER — NIFEDIPINE 10 MG PO CAPS
30.0000 mg | ORAL_CAPSULE | Freq: Four times a day (QID) | ORAL | Status: DC
  Administered 2015-07-12: 30 mg via ORAL
  Filled 2015-07-12: qty 3

## 2015-07-12 MED ORDER — ACETAMINOPHEN 325 MG PO TABS: 650.0000 mg | ORAL_TABLET | ORAL | Status: DC | PRN

## 2015-07-12 MED ORDER — DIPHENHYDRAMINE HCL 12.5 MG/5ML PO ELIX
12.5000 mg | ORAL_SOLUTION | Freq: Four times a day (QID) | ORAL | Status: DC | PRN
  Filled 2015-07-12: qty 5

## 2015-07-12 MED ORDER — WITCH HAZEL-GLYCERIN EX PADS: 1.0000 "application " | MEDICATED_PAD | CUTANEOUS | Status: DC | PRN

## 2015-07-12 MED ORDER — LACTATED RINGERS IV SOLN
INTRAVENOUS | Status: DC
  Administered 2015-07-12 (×2): 37.5 mL/h via INTRAVENOUS

## 2015-07-12 MED ORDER — MIDAZOLAM HCL 2 MG/2ML IJ SOLN
INTRAMUSCULAR | Status: DC | PRN
Start: 2015-07-12 — End: 2015-07-12
  Administered 2015-07-12: 2 mg via INTRAVENOUS

## 2015-07-12 MED ORDER — COCONUT OIL OIL
1.0000 "application " | TOPICAL_OIL | Status: DC | PRN
  Filled 2015-07-12: qty 120

## 2015-07-12 MED ORDER — FENTANYL CITRATE (PF) 100 MCG/2ML IJ SOLN
INTRAMUSCULAR | Status: DC | PRN
  Administered 2015-07-12: 100 ug via INTRAVENOUS
  Administered 2015-07-12: 150 ug via INTRAVENOUS

## 2015-07-12 MED ORDER — SUCCINYLCHOLINE CHLORIDE 20 MG/ML IJ SOLN
INTRAMUSCULAR | Status: DC | PRN
  Administered 2015-07-12: 180 mg via INTRAVENOUS

## 2015-07-12 MED ORDER — PROMETHAZINE HCL 25 MG/ML IJ SOLN
INTRAMUSCULAR | Status: AC
  Filled 2015-07-12: qty 1

## 2015-07-12 MED ORDER — PRENATAL MULTIVITAMIN CH
1.0000 | ORAL_TABLET | Freq: Every day | ORAL | Status: DC
  Administered 2015-07-13 – 2015-07-15 (×3): 1 via ORAL
  Filled 2015-07-12 (×3): qty 1

## 2015-07-12 MED ORDER — DEXTROSE 5 % IV SOLN: 3.0000 g | Freq: Once | INTRAVENOUS | Status: DC

## 2015-07-12 MED ORDER — LACTATED RINGERS IV SOLN
INTRAVENOUS | Status: DC
  Administered 2015-07-12: 12:00:00 via INTRAVENOUS

## 2015-07-12 MED ORDER — FENTANYL CITRATE (PF) 250 MCG/5ML IJ SOLN
INTRAMUSCULAR | Status: AC
  Filled 2015-07-12: qty 5

## 2015-07-12 MED ORDER — ROCURONIUM BROMIDE 100 MG/10ML IV SOLN
INTRAVENOUS | Status: DC | PRN
  Administered 2015-07-12: 50 mg via INTRAVENOUS

## 2015-07-12 MED ORDER — SIMETHICONE 80 MG PO CHEW
80.0000 mg | CHEWABLE_TABLET | ORAL | Status: DC
  Administered 2015-07-12 – 2015-07-14 (×3): 80 mg via ORAL
  Filled 2015-07-12 (×3): qty 1

## 2015-07-12 MED ORDER — DIBUCAINE 1 % RE OINT: 1.0000 "application " | TOPICAL_OINTMENT | RECTAL | Status: DC | PRN

## 2015-07-12 MED ORDER — CARBOPROST TROMETHAMINE 250 MCG/ML IM SOLN
INTRAMUSCULAR | Status: AC
  Filled 2015-07-12: qty 1

## 2015-07-12 MED ORDER — ZOLPIDEM TARTRATE 5 MG PO TABS
5.0000 mg | ORAL_TABLET | Freq: Every evening | ORAL | Status: DC | PRN
  Administered 2015-07-13 – 2015-07-14 (×2): 5 mg via ORAL
  Filled 2015-07-12 (×2): qty 1

## 2015-07-12 MED ORDER — OXYTOCIN 10 UNIT/ML IJ SOLN
40.0000 [IU] | INTRAVENOUS | Status: DC | PRN
  Administered 2015-07-12 (×2): 40 [IU] via INTRAVENOUS

## 2015-07-12 MED ORDER — SODIUM CHLORIDE 0.9 % IV SOLN: INTRAVENOUS | Status: DC

## 2015-07-12 MED ORDER — OXYCODONE HCL 5 MG PO TABS
5.0000 mg | ORAL_TABLET | ORAL | Status: DC | PRN
  Administered 2015-07-14 – 2015-07-15 (×6): 5 mg via ORAL
  Filled 2015-07-12 (×6): qty 1

## 2015-07-12 MED ORDER — SODIUM CHLORIDE 0.9% FLUSH: 9.0000 mL | INTRAVENOUS | Status: DC | PRN

## 2015-07-12 MED ORDER — NALOXONE HCL 0.4 MG/ML IJ SOLN: 0.4000 mg | INTRAMUSCULAR | Status: DC | PRN

## 2015-07-12 MED ORDER — CEFAZOLIN SODIUM-DEXTROSE 2-3 GM-% IV SOLR
INTRAVENOUS | Status: DC | PRN
  Administered 2015-07-12: 2 g via INTRAVENOUS

## 2015-07-12 MED ORDER — DIPHENHYDRAMINE HCL 25 MG PO CAPS: 25.0000 mg | ORAL_CAPSULE | Freq: Four times a day (QID) | ORAL | Status: DC | PRN

## 2015-07-12 MED ORDER — SUGAMMADEX SODIUM 500 MG/5ML IV SOLN
INTRAVENOUS | Status: AC
  Filled 2015-07-12: qty 5

## 2015-07-12 MED ORDER — ONDANSETRON HCL 4 MG/2ML IJ SOLN
INTRAMUSCULAR | Status: DC | PRN
  Administered 2015-07-12: 4 mg via INTRAVENOUS

## 2015-07-12 MED ORDER — SIMETHICONE 80 MG PO CHEW
80.0000 mg | CHEWABLE_TABLET | Freq: Three times a day (TID) | ORAL | Status: DC
  Administered 2015-07-12 – 2015-07-15 (×11): 80 mg via ORAL
  Filled 2015-07-12 (×10): qty 1

## 2015-07-12 MED ORDER — TETANUS-DIPHTH-ACELL PERTUSSIS 5-2.5-18.5 LF-MCG/0.5 IM SUSP
0.5000 mL | Freq: Once | INTRAMUSCULAR | Status: DC
  Filled 2015-07-12: qty 0.5

## 2015-07-12 MED ORDER — MISOPROSTOL 200 MCG PO TABS
ORAL_TABLET | ORAL | Status: AC
  Filled 2015-07-12: qty 5

## 2015-07-12 MED ORDER — OXYTOCIN 40 UNITS IN LACTATED RINGERS INFUSION - SIMPLE MED
2.5000 [IU]/h | INTRAVENOUS | Status: AC
  Administered 2015-07-12: 2.5 [IU]/h via INTRAVENOUS
  Filled 2015-07-12: qty 1000

## 2015-07-12 MED ORDER — IBUPROFEN 600 MG PO TABS: 600.0000 mg | ORAL_TABLET | Freq: Four times a day (QID) | ORAL | Status: DC

## 2015-07-12 MED ORDER — FENTANYL CITRATE (PF) 100 MCG/2ML IJ SOLN
25.0000 ug | INTRAMUSCULAR | Status: DC | PRN
  Administered 2015-07-12: 25 ug via INTRAVENOUS
  Administered 2015-07-12 (×2): 50 ug via INTRAVENOUS
  Administered 2015-07-12: 25 ug via INTRAVENOUS

## 2015-07-12 MED ORDER — DEXTROSE 5 % IV SOLN
1.0000 g | Freq: Two times a day (BID) | INTRAVENOUS | Status: AC
  Administered 2015-07-12 (×2): 1 g via INTRAVENOUS
  Filled 2015-07-12 (×2): qty 1

## 2015-07-12 MED ORDER — PROMETHAZINE HCL 25 MG/ML IJ SOLN
6.2500 mg | INTRAMUSCULAR | Status: DC | PRN
  Administered 2015-07-12: 6.25 mg via INTRAVENOUS

## 2015-07-12 MED ORDER — OXYCODONE HCL 5 MG PO TABS
10.0000 mg | ORAL_TABLET | ORAL | Status: DC | PRN
  Administered 2015-07-13 – 2015-07-14 (×4): 10 mg via ORAL
  Filled 2015-07-12 (×4): qty 2

## 2015-07-12 MED ORDER — ALBUTEROL SULFATE (2.5 MG/3ML) 0.083% IN NEBU: 2.5000 mg | INHALATION_SOLUTION | Freq: Four times a day (QID) | RESPIRATORY_TRACT | Status: DC | PRN

## 2015-07-12 MED ORDER — ONDANSETRON HCL 4 MG/2ML IJ SOLN
INTRAMUSCULAR | Status: AC
  Filled 2015-07-12: qty 2

## 2015-07-12 MED ORDER — ONDANSETRON HCL 4 MG/2ML IJ SOLN: 4.0000 mg | Freq: Once | INTRAMUSCULAR | Status: DC

## 2015-07-12 MED ORDER — MAGNESIUM SULFATE BOLUS VIA INFUSION
6.0000 g | Freq: Once | INTRAVENOUS | Status: AC
  Administered 2015-07-12: 6 g via INTRAVENOUS
  Filled 2015-07-12: qty 500

## 2015-07-12 MED ORDER — LABETALOL HCL 5 MG/ML IV SOLN
INTRAVENOUS | Status: AC
  Filled 2015-07-12: qty 4

## 2015-07-12 MED ORDER — MENTHOL 3 MG MT LOZG: 1.0000 | LOZENGE | OROMUCOSAL | Status: DC | PRN

## 2015-07-12 MED ORDER — ONDANSETRON HCL 4 MG/2ML IJ SOLN
4.0000 mg | Freq: Four times a day (QID) | INTRAMUSCULAR | Status: DC
  Administered 2015-07-12: 4 mg via INTRAVENOUS
  Filled 2015-07-12: qty 2

## 2015-07-12 MED ORDER — CEFAZOLIN SODIUM-DEXTROSE 2-4 GM/100ML-% IV SOLN
INTRAVENOUS | Status: AC
  Filled 2015-07-12: qty 100

## 2015-07-12 MED ORDER — ESMOLOL HCL 100 MG/10ML IV SOLN
INTRAVENOUS | Status: AC
Start: 2015-07-12 — End: 2015-07-12
  Filled 2015-07-12: qty 10

## 2015-07-12 MED ORDER — PROPOFOL 10 MG/ML IV BOLUS
INTRAVENOUS | Status: AC
  Filled 2015-07-12: qty 20

## 2015-07-12 MED ORDER — ROCURONIUM BROMIDE 100 MG/10ML IV SOLN
INTRAVENOUS | Status: AC
  Filled 2015-07-12: qty 1

## 2015-07-12 MED ORDER — MIDAZOLAM HCL 2 MG/2ML IJ SOLN
INTRAMUSCULAR | Status: AC
  Filled 2015-07-12: qty 2

## 2015-07-12 MED ORDER — SOD CITRATE-CITRIC ACID 500-334 MG/5ML PO SOLN
ORAL | Status: AC
  Filled 2015-07-12: qty 15

## 2015-07-12 MED ORDER — LABETALOL HCL 5 MG/ML IV SOLN
20.0000 mg | INTRAVENOUS | Status: DC | PRN
  Administered 2015-07-12 – 2015-07-13 (×2): 20 mg via INTRAVENOUS
  Filled 2015-07-12: qty 4

## 2015-07-12 MED ORDER — LACTATED RINGERS IV BOLUS (SEPSIS)
1000.0000 mL | Freq: Once | INTRAVENOUS | Status: AC
  Administered 2015-07-12: 1000 mL via INTRAVENOUS

## 2015-07-12 MED ORDER — LACTATED RINGERS IV SOLN
INTRAVENOUS | Status: DC | PRN
  Administered 2015-07-12: 08:00:00 via INTRAVENOUS

## 2015-07-12 MED ORDER — PROPOFOL 10 MG/ML IV BOLUS
INTRAVENOUS | Status: DC | PRN
  Administered 2015-07-12: 200 mg via INTRAVENOUS

## 2015-07-12 MED ORDER — MISOPROSTOL 200 MCG PO TABS
ORAL_TABLET | ORAL | Status: DC | PRN
  Administered 2015-07-12: 1000 ug via RECTAL

## 2015-07-12 MED ORDER — SIMETHICONE 80 MG PO CHEW
80.0000 mg | CHEWABLE_TABLET | ORAL | Status: DC | PRN
  Filled 2015-07-12 (×2): qty 1

## 2015-07-12 MED ORDER — HYDRALAZINE HCL 20 MG/ML IJ SOLN
10.0000 mg | Freq: Once | INTRAMUSCULAR | Status: DC | PRN
  Filled 2015-07-12: qty 1

## 2015-07-12 MED ORDER — DIPHENHYDRAMINE HCL 50 MG/ML IJ SOLN: 12.5000 mg | Freq: Four times a day (QID) | INTRAMUSCULAR | Status: DC | PRN

## 2015-07-12 MED ORDER — SUGAMMADEX SODIUM 500 MG/5ML IV SOLN
INTRAVENOUS | Status: DC | PRN
  Administered 2015-07-12: 453.6 mg via INTRAVENOUS

## 2015-07-12 MED ORDER — MAGNESIUM SULFATE 50 % IJ SOLN
2.0000 g/h | INTRAVENOUS | Status: AC
  Administered 2015-07-12 – 2015-07-13 (×2): 2 g/h via INTRAVENOUS
  Filled 2015-07-12 (×2): qty 80

## 2015-07-12 MED ORDER — CARBOPROST TROMETHAMINE 250 MCG/ML IM SOLN
INTRAMUSCULAR | Status: DC | PRN
  Administered 2015-07-12: 250 ug via INTRAMUSCULAR

## 2015-07-12 MED ORDER — ONDANSETRON HCL 4 MG/2ML IJ SOLN: 4.0000 mg | Freq: Four times a day (QID) | INTRAMUSCULAR | Status: DC | PRN

## 2015-07-12 MED ORDER — SODIUM CHLORIDE 0.9 % IV SOLN
INTRAVENOUS | Status: DC | PRN
  Administered 2015-07-12: 08:00:00 via INTRAVENOUS

## 2015-07-12 SURGICAL SUPPLY — 37 items
CLAMP CORD UMBIL (MISCELLANEOUS) IMPLANT
CLOTH BEACON ORANGE TIMEOUT ST (SAFETY) ×3 IMPLANT
DRAIN JACKSON PRT FLT 7MM (DRAIN) IMPLANT
DRSG OPSITE POSTOP 4X10 (GAUZE/BANDAGES/DRESSINGS) ×3 IMPLANT
DRSG OPSITE POSTOP 4X12 (GAUZE/BANDAGES/DRESSINGS) ×2 IMPLANT
DURAPREP 26ML APPLICATOR (WOUND CARE) ×3 IMPLANT
ELECT BLADE 6 FLAT ULTRCLN (ELECTRODE) ×2 IMPLANT
ELECT REM PT RETURN 9FT ADLT (ELECTROSURGICAL) ×3
ELECTRODE REM PT RTRN 9FT ADLT (ELECTROSURGICAL) ×1 IMPLANT
EVACUATOR SILICONE 100CC (DRAIN) IMPLANT
EXTRACTOR VACUUM M CUP 4 TUBE (SUCTIONS) IMPLANT
EXTRACTOR VACUUM M CUP 4' TUBE (SUCTIONS)
GLOVE BIO SURGEON STRL SZ7 (GLOVE) ×3 IMPLANT
GLOVE BIOGEL PI IND STRL 7.0 (GLOVE) ×2 IMPLANT
GLOVE BIOGEL PI INDICATOR 7.0 (GLOVE) ×4
GOWN STRL REUS W/TWL LRG LVL3 (GOWN DISPOSABLE) ×6 IMPLANT
KIT ABG SYR 3ML LUER SLIP (SYRINGE) IMPLANT
NDL HYPO 25X5/8 SAFETYGLIDE (NEEDLE) ×1 IMPLANT
NEEDLE HYPO 25X5/8 SAFETYGLIDE (NEEDLE) ×3 IMPLANT
NS IRRIG 1000ML POUR BTL (IV SOLUTION) ×7 IMPLANT
PACK C SECTION WH (CUSTOM PROCEDURE TRAY) ×3 IMPLANT
PAD ABD 7.5X8 STRL (GAUZE/BANDAGES/DRESSINGS) ×2 IMPLANT
PAD OB MATERNITY 4.3X12.25 (PERSONAL CARE ITEMS) ×3 IMPLANT
PENCIL SMOKE EVAC W/HOLSTER (ELECTROSURGICAL) ×3 IMPLANT
RTRCTR C-SECT PINK 25CM LRG (MISCELLANEOUS) ×3 IMPLANT
SPONGE LAP 18X18 X RAY DECT (DISPOSABLE) ×6 IMPLANT
STAPLER VISISTAT 35W (STAPLE) ×2 IMPLANT
SUT MNCRL 0 VIOLET CTX 36 (SUTURE) IMPLANT
SUT MON AB-0 CT1 36 (SUTURE) ×2 IMPLANT
SUT MONOCRYL 0 CTX 36 (SUTURE) ×2
SUT PDS AB 1 CTX 36 (SUTURE) ×4 IMPLANT
SUT SILK 3 0 SH CR/8 (SUTURE) ×2 IMPLANT
SUT VIC AB 0 CTX 36 (SUTURE) ×15
SUT VIC AB 0 CTX36XBRD ANBCTRL (SUTURE) ×5 IMPLANT
SUT VIC AB 4-0 KS 27 (SUTURE) ×3 IMPLANT
TOWEL OR 17X24 6PK STRL BLUE (TOWEL DISPOSABLE) ×3 IMPLANT
TRAY FOLEY CATH SILVER 14FR (SET/KITS/TRAYS/PACK) ×3 IMPLANT

## 2015-07-12 NOTE — Transfer of Care (Signed)
Immediate Anesthesia Transfer of Care Note  Patient: Emily [name]  Procedure(s) Performed: Procedure(s): CESAREAN SECTION (N/A)  Patient Location: PACU  Anesthesia Type:General  Level of Consciousness: awake, alert  and sedated  Airway & Oxygen Therapy: Patient Spontanous Breathing and Patient connected to face mask oxygen  Post-op Assessment: Report given to RN, Post -op Vital signs reviewed and stable and Patient moving all extremities  Post vital signs: Reviewed and stable  Last Vitals:  Filed Vitals:   07/12/15 0741 07/12/15 0746  BP:    Pulse: 127 127  Temp:    Resp:      Last Pain:  Filed Vitals:   07/12/15 0842  PainSc: 10-Worst pain ever      Patients Stated Pain Goal: 0 ([id number])  Complications: No apparent anesthesia complications

## 2015-07-12 NOTE — Progress Notes (Signed)
Dilaudid PCA reviewed by Dr. Roselie Awkward at bedside.

## 2015-07-12 NOTE — MAU Note (Signed)
anes at bedside

## 2015-07-12 NOTE — Lactation Note (Signed)
This note was copied from a baby's chart. Lactation Consultation Note  Patient Name: Emily [name] Today's Date: 07/12/2015 Reason for consult: Initial assessment;NICU baby  Mom requested to speak with an IBCLC in regards to her medications and their safety with breastfeeding. Mom has MS and she will likely resume fingolimod, her immune modulator, which is an L5. Mom understands that she absolutely cannot breastfeed while on this medication.   If she does not receive another prescription for fingolimid, she is on a litany of daily & prn meds whose cumulative effects may also preclude one from breastfeeding (daily meds: buspirone (L3); fluoxetine (L2); dipenhydramine (L2); labetalol (L2) and prn meds: diazepam (L3); promethazine (L3); sumatriptan (L3); cyclobenzaprine (L3) & ranitidine (L2)). Mom understands that if the fingolimod was not prescribed and she still had an interest in breastfeeding, then the [place] could be contacted on her behalf ([phone]).   We also spoke briefly about breast care when milk comes to volume (when lactation is not being actively pursued).   Matthias Hughs [facility] 07/12/2015, 9:26 PM

## 2015-07-12 NOTE — Op Note (Signed)
Emily [name] PROCEDURE DATE: 07/12/2015  PREOPERATIVE DIAGNOSIS: 35 yo female G1P0 at  [redacted]w[redacted]d weeks gestation with acute abdomen of unknown etiology and fetal bradycardia.  POSTOPERATIVE DIAGNOSIS: 35 yo female G1P0 at [redacted]w[redacted]d with placental abruption and fetal bradycardia.  PROCEDURE:    Low Transverse Cesarean Section, abdominal exploration  SURGEON:  Dr. Silas Sacramento  ASSISTANT: Dr. Laurey Arrow  CONSULT:  Dr. Rosendo Gros, General Surgery  INDICATIONS: Emily [name] is a 35 y.o. G1P0101 at [redacted]w[redacted]d with acute abdomen and fetal bradycardia.  The risks of cesarean section discussed with the patient included but were not limited to: bleeding which may require transfusion or reoperation; infection which may require antibiotics; injury to bowel, bladder, ureters or other surrounding organs; injury to the fetus; need for additional procedures including hysterectomy in the event of a life-threatening hemorrhage, incisional problems, thromboembolic phenomenon and other postoperative/anesthesia complications. The patient concurred with the proposed plan, giving informed written consent for the procedure.    FINDINGS:  Hemoperitoneum upon entry, viable female  infant in cephalic presentation, [id number] Apgars, weight to be determined in NICU, clear amniotic fluid.  Intact placenta, with marginal insertion of cord.  One third or uterine surface is dark blue (couvelaire uterus) especially near fallopian tubes. Small amount of blood extruding from fallopian tubes.  Normal ovaries bilaterally.  No evidence of uterine rupture or intestinal perforation (see general surgery for their operative findings) .   ANESTHESIA:    General  ESTIMATED BLOOD LOSS: 1000cc  SPECIMENS: Placenta sent to pathology  COMPLICATIONS: None immediate  PROCEDURE IN DETAIL:  The patient received intravenous antibiotics and had sequential compression devices applied to her lower extremities.  Foley catheter was placed in her bladder.  Due  to emergent nature of procedure, limited abdominal prep was done.  Pt was then draped and general anesthesia was induced.  After an adequate timeout was performed, a vertical skin incision was made with scalpel and carried through to the underlying layer of fascia. The fascia was incised in the midline and this incision was extended superiorly and inferiorly using the Mayo scissors. The rectus muscles were separated in the midline bluntly and the peritoneum was entered bluntly.  Hemoperitoneum was noted on entry.   A transverse hysterotomy was made with a scalpel and extended bilaterally bluntly. The bladder blade was then removed. The infant was successfully delivered, and cord was clamped and cut and infant was handed over to awaiting neonatology team. Uterine massage was then administered and the placenta delivered intact with three-vessel cord. The uterus was cleared of clot and debris.  The hysterotomy was closed with 0 monocryl.  A second suture of 0-Vicryl was used to reinforce the incision and aid in hemostasis.  The uterus was normal size but soft.  Cytotec was administered rectally and hemabate was given IM x1.  General surgeon, Dr. Rosendo Gros then took over the procedure for exploration of the abdominal cavity and closure.  There was a small amount of oozing from the peritoneum superior to the bladder.  Arista was placed in this area prior to closure.   Pt tolerated the procedure will.  All counts were correct x2.  Pt went to the recovery room in stable condition.

## 2015-07-12 NOTE — MAU Note (Signed)
Emergency response OB RN at bedside.

## 2015-07-12 NOTE — MAU Note (Signed)
Attempts were made to establish 2nd IV site by RN and Anesthesia MD without success. O2 administered via [place] @ 4L/min. Dr. Gala Romney at bedside. Attempting to assess FHR via U/S. Patient continues to be alert and oriented, very diaphoretic, pale, C/O extreme pain. Abdomen very tender to touch.

## 2015-07-12 NOTE — Progress Notes (Signed)
In and out cath with only approx 50 ml of dark urine.  Tolerated well.

## 2015-07-12 NOTE — Anesthesia Postprocedure Evaluation (Signed)
Anesthesia Post Note  Patient: Emily [name]  Procedure(s) Performed: Procedure(s) (LRB): CESAREAN SECTION (N/A)  Patient location during evaluation: PACU Anesthesia Type: General Level of consciousness: awake and alert Pain management: pain level controlled Vital Signs Assessment: post-procedure vital signs reviewed and stable Respiratory status: spontaneous breathing, nonlabored ventilation, respiratory function stable and patient connected to nasal cannula oxygen Cardiovascular status: blood pressure returned to baseline and stable (Labetalol per primary surgeon) Postop Assessment: no signs of nausea or vomiting Anesthetic complications: no     Last Vitals:  Filed Vitals:   07/12/15 1206 07/12/15 1215  BP:  120/77  Pulse:  85  Temp:    Resp: 16 18    Last Pain:  Filed Vitals:   07/12/15 1230  PainSc: 7    Pain Goal: Patients Stated Pain Goal: 3 (07/12/15 1223)               Catalina Gravel

## 2015-07-12 NOTE — Addendum Note (Signed)
Addendum  created 07/12/15 1538 by Jonna Munro, CRNA   Modules edited: Clinical Notes   Clinical Notes:  File: [id number]

## 2015-07-12 NOTE — Anesthesia Preprocedure Evaluation (Signed)
Anesthesia Evaluation  Patient identified by MRN, date of birth, ID band  Reviewed: Allergy & Precautions, NPO status , Patient's Chart, lab work & pertinent test resultsPreop documentation limited or incomplete due to emergent nature of procedure.  Airway Mallampati: III  TM Distance: >3 FB Neck ROM: Full    Dental  (+) Teeth Intact, Dental Advisory Given   Pulmonary asthma , Current Smoker,    Pulmonary exam normal breath sounds clear to auscultation       Cardiovascular hypertension, negative cardio ROS   Rhythm:Regular Rate:Tachycardia     Neuro/Psych  Headaches, PSYCHIATRIC DISORDERS Anxiety Depression MS    GI/Hepatic Neg liver ROS, GERD  ,H/o ex-lap for adhesions; concern for bowel perforation today   Endo/Other  Morbid obesity  Renal/GU negative Renal ROS     Musculoskeletal negative musculoskeletal ROS (+)   Abdominal   Peds  Hematology negative hematology ROS (+)   Anesthesia Other Findings Day of surgery medications reviewed with the patient.  Reproductive/Obstetrics (+) Pregnancy 36weeks                             Anesthesia Physical Anesthesia Plan  ASA: III and emergent  Anesthesia Plan: General   Post-op Pain Management:    Induction: Intravenous, Rapid sequence and Cricoid pressure planned  Airway Management Planned: Oral ETT  Additional Equipment: CVP  Intra-op Plan:   Post-operative Plan: Possible Post-op intubation/ventilation  Informed Consent: I have reviewed the patients History and Physical, chart, labs and discussed the procedure including the risks, benefits and alternatives for the proposed anesthesia with the patient or authorized representative who has indicated his/her understanding and acceptance.   Dental advisory given  Plan Discussed with: CRNA  Anesthesia Plan Comments: (Risks/benefits of general anesthesia discussed with patient including  risk of damage to teeth, lips, gum, and tongue, nausea/vomiting, allergic reactions to medications, and the possibility of heart attack, stroke and death.  All patient questions answered.  Patient wishes to proceed.)        Anesthesia Quick Evaluation

## 2015-07-12 NOTE — Progress Notes (Signed)
Subjective: Postpartum Day 0: Cesarean Delivery Patient reports nausea.  No headache or scotomata  Objective: Vital signs in last 24 hours: Temp:  [97.5 F (36.4 C)-98.7 F (37.1 C)] 97.6 F (36.4 C) (07/05 1155) Pulse Rate:  [79-136] 85 (07/05 1230) Resp:  [11-28] 17 (07/05 1230) BP: (113-207)/(77-147) 128/85 mmHg (07/05 1230) SpO2:  [97 %-100 %] 99 % (07/05 1215)  Physical Exam:  General: alert, cooperative and no distress Lochia: appropriate Uterine Fundus: firm Incision: Dressing intact DVT Evaluation: No evidence of DVT seen on physical exam. Negative Homan's sign.   Recent Labs  07/12/15 0721 07/12/15 0813  HGB 13.0 11.0*  HCT 37.4 33.0*    Assessment/Plan: Status post Cesarean section and abdominal exploration  HTN:  Protein creatinine ration elevate--Severe preeclampsia.  Start Magnesium Sulfate GI:  NPO for now.  General surgery following Neuro:  Pt was being evaluated for MS flare.  Dr. Roselie Awkward to call [facility] Neurology ID:  No evidence of infection.  Will continue abx for 24 hours since thorough abdominal prep was not done due to emergent nature of surgery. Gyn:  Monitor for signs of uterine atony. Psych:  Monitor for signs of worsening anxiety and depression.  Pt had emotional day yesterday due to family issues.  SW consult.  Yurani Fettes H. 07/12/2015, 12:43 PM

## 2015-07-12 NOTE — MAU Note (Signed)
Order changed per Dr Gala Romney to KUB, requested radiology to come for bedside

## 2015-07-12 NOTE — Anesthesia Procedure Notes (Addendum)
Central Venous Catheter Insertion Performed by: anesthesiologist 07/12/2015 8:10 AM Patient location: OR. Preanesthetic checklist: patient identified, IV checked, site marked, risks and benefits discussed, surgical consent, monitors and equipment checked, pre-op evaluation, timeout performed and anesthesia consent Position: supine Landmarks identified Catheter size: 8 Fr Central line was placed.Double lumen Procedure performed using ultrasound guided technique. Attempts: 1 Following insertion, dressing applied and line sutured. Post procedure assessment: blood return through all ports. Patient tolerated the procedure well with no immediate complications. Additional procedure comments: Emergent line placement intraop..     Procedure Name: Intubation Date/Time: 07/12/2015 7:53 AM Performed by: Hewitt Blade Pre-anesthesia Checklist: Patient identified, Patient being monitored, Emergency Drugs available and Suction available Patient Re-evaluated:Patient Re-evaluated prior to inductionOxygen Delivery Method: Circle system utilized Preoxygenation: Pre-oxygenation with 100% oxygen Intubation Type: IV induction, Rapid sequence and Cricoid Pressure applied Laryngoscope Size: Glidescope Grade View: Grade I Tube type: Oral Tube size: 7.0 mm Number of attempts: 1 Airway Equipment and Method: Rigid stylet Placement Confirmation: ETT inserted through vocal cords under direct vision,  positive ETCO2 and breath sounds checked- equal and bilateral Secured at: 21 cm Tube secured with: Tape Dental Injury: Teeth and Oropharynx as per pre-operative assessment

## 2015-07-12 NOTE — Anesthesia Postprocedure Evaluation (Signed)
Anesthesia Post Note  Patient: Emily [name]  Procedure(s) Performed: Procedure(s) (LRB): CESAREAN SECTION (N/A)  Patient location during evaluation: A-ICU Anesthesia Type: General Level of consciousness: awake and alert and oriented Pain management: pain level controlled Vital Signs Assessment: post-procedure vital signs reviewed and stable Respiratory status: spontaneous breathing, nonlabored ventilation, respiratory function stable and patient connected to nasal cannula oxygen Cardiovascular status: stable Postop Assessment: no headache, no backache, no signs of nausea or vomiting and adequate PO intake Anesthetic complications: no     Last Vitals:  Filed Vitals:   07/12/15 1410 07/12/15 1500  BP:  133/88  Pulse:  88  Temp:    Resp: 16 19    Last Pain:  Filed Vitals:   07/12/15 1523  PainSc: 5    Pain Goal: Patients Stated Pain Goal: 3 (07/12/15 1223)               Willa Rough

## 2015-07-12 NOTE — Progress Notes (Signed)
Pt has tried twice to use bedpan for void and bm without results.  Pt keeps feeling like she needs to go. Ok per Dr Si Raider to let her get up to bedside commode.  Pt tried to have bm/void with no results on bedside commode.  Back in bed.

## 2015-07-12 NOTE — MAU Note (Signed)
Radiology called, call in  for CT tech; aware this is stat, no contrast

## 2015-07-12 NOTE — MAU Note (Signed)
Lower abd and back pain since midnight.  Having pressure of having to pee and poop.  Pain got so severe, called for fiance.  Baby moving well. No bleeding.  No leaking.

## 2015-07-12 NOTE — H&P (Signed)
LABOR AND DELIVERY ADMISSION HISTORY AND PHYSICAL NOTE  Emily [name] is a 35 y.o. female G1P0 with IUP at [redacted]w[redacted]d presents with abdominal pain. Severe pain. Middle of abdomen. Constant and comes and goes. Nauseaus. Started this morning around 2 AM. Hasn't had this pain before. No bleeding or LOF. Hx bowel perforation/resection with colstomy and take-down in 2009.  She reports positive fetal movement. She denies leakage of fluid or vaginal bleeding.  Prenatal History/Complications:  Past Medical History: Past Medical History  Diagnosis Date  . Headache(784.0)     migraines  . Multiple sclerosis ([place])   . Asthma as a child  . GERD (gastroesophageal reflux disease)   . Urinary urgency   . Anxiety   . Perforated bowel ([place]) 2009  . Migraines   . Depression   . Chronic back pain   . Chronic pain   . Ovarian cyst   . Panic attack   . Pseudoseizures   . Conversion disorder   . JC virus antibody positive   . PTSD (post-traumatic stress disorder)   . HTN (hypertension) 02/27/2015  . Pregnant and not yet delivered     due Aug 15, 2015    Past Surgical History: Past Surgical History  Procedure Laterality Date  . Extremity cyst excision  1994    right leg  . 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 Colostomy closure  04/2007  . Scar revision  01/21/2011    Procedure: SCAR REVISION;  Surgeon: Hermelinda Dellen;  Location: [facility];  Service: Plastics;  Laterality: N/A;  exploration of scar of abdomen and repair of defect  . Hernia repair    . Abdominal surgery    . Appendectomy      Obstetrical History: OB History    Gravida Para Term Preterm AB TAB SAB Ectopic Multiple Living   1 1  1      0 1      Social History: Social History   Social History  . Marital Status: Single    Spouse Name: N/A  . Number of Children: 0  . Years of Education: College   Occupational History  .  Other    disability   Social History Main Topics  . Smoking status:  Current Every Day Smoker -- 0.25 packs/day for 10 years    Types: Cigarettes  . Smokeless tobacco: Never Used  . Alcohol Use: No     Comment: Rare  . Drug Use: No  . Sexual Activity: Yes    Birth Control/ Protection: None, Condom   Other Topics Concern  . None   Social History Narrative   Patient lives at home with fiance in [place]. Born and raised in [place], [place] by parents. Pt has one younger sister. Pt has a Best boy. Pt worked from 1999-2006. She stopped due to MS and is currently on disability. Pt is pregnant with her 1st child. Married for less than 1 yr in 1999 that ended in divorce.             Caffeine Use: 1 20oz soda daily    Family History: Family History  Problem Relation Age of Onset  . Diabetes Mother   . Hypertension Mother   . Diabetes Father   . Hypertension Father   . Arthritis Father   . Cancer Maternal Grandmother   . Cancer Maternal Grandfather   . Alcohol abuse Neg Hx   . Anxiety disorder Neg Hx   .  Bipolar disorder Neg Hx   . Drug abuse Neg Hx   . Depression Neg Hx     Allergies: Allergies  Allergen Reactions  . Amitriptyline Hypertension and Other (See Comments)    hypertension  . Baclofen Hives and Shortness Of Breath  . Cymbalta [Duloxetine Hcl] Shortness Of Breath and Rash  . Gabapentin Shortness Of Breath and Rash  . Monosodium Glutamate Anaphylaxis  . Other Shortness Of Breath and Rash    MSG, beans (vomiting)  . Vicodin [Hydrocodone-Acetaminophen] Hives and Nausea And Vomiting    Projectile vomiting  . Alprazolam Other (See Comments)    Lethargy  . Magnesium Salicylate Hives and Itching  . Rizatriptan Nausea And Vomiting and Other (See Comments)    GI upset, Projectile vomiting  . Tizanidine Hives  . Tramadol Other (See Comments)    Unspecified  . Adhesive [Tape] Other (See Comments)    Skin irritation  . Lamotrigine Rash  . Toradol [Ketorolac Tromethamine] Nausea Only    Prescriptions prior to  admission  Medication Sig Dispense Refill Last Dose  . albuterol (PROVENTIL HFA;VENTOLIN HFA) 108 (90 Base) MCG/ACT inhaler Inhale 2 puffs into the lungs every 6 (six) hours as needed for wheezing or shortness of breath. 1 Inhaler 0 rescue  . aspirin 81 MG tablet Take 1 tablet (81 mg total) by mouth daily. 100 tablet 2 07/06/2015 at Unknown time  . busPIRone (BUSPAR) 5 MG tablet Take 1 tablet (5 mg total) by mouth 2 (two) times daily. 60 tablet 1 07/06/2015 at Unknown time  . cephALEXin (KEFLEX) 500 MG capsule Take 1 capsule (500 mg total) by mouth 4 (four) times daily. 28 capsule 0   . cyclobenzaprine (FLEXERIL) 10 MG tablet TAKE 1 TABLET BY MOUTH 3 TIMES DAILY AS NEEDED FOR MUSCLE SPASMS. 30 tablet 0 07/05/2015 at Unknown time  . diazepam (VALIUM) 5 MG tablet Take 1 tablet as needed for panic attacks leading to seizures. Do not take more than 5 a month 5 tablet 5 Past Week at Unknown time  . diphenhydrAMINE (SOMINEX) 25 MG tablet Take 25 mg by mouth at bedtime as needed for sleep.   07/05/2015 at Unknown time  . Elastic Bandages & Supports (ADJUSTABLE WRIST BRACE) MISC 1 each by Does not apply route as directed. 1 right cock-up brace - wrist. 1 each 0 Taking  . EPINEPHrine (EPIPEN) 0.3 mg/0.3 mL SOAJ injection Inject 0.3 mg into the muscle as needed (allergic reaction). Reported on 03/28/2015   Taking  . FLUoxetine (PROZAC) 20 MG tablet Take 1 tablet (20 mg total) by mouth daily. 30 tablet 1 07/06/2015 at Unknown time  . folic acid (FOLVITE) 1 MG tablet Take 3 tablets (3 mg total) by mouth daily. 90 tablet 5 07/06/2015 at Unknown time  . labetalol (NORMODYNE) 200 MG tablet Take 2 tablets (400 mg total) by mouth 2 (two) times daily. 120 tablet 1   . Multiple Vitamins-Minerals (MULTIVITAMIN ADULT PO) Take by mouth.   07/06/2015 at Unknown time  . promethazine (PHENERGAN) 25 MG tablet Take 0.5-1 tablets (12.5-25 mg total) by mouth every 6 (six) hours as needed for nausea. 30 tablet 0   . ranitidine (ZANTAC)  300 MG capsule Take 300 mg by mouth at bedtime.    07/05/2015 at Unknown time  . SUMAtriptan (IMITREX) 100 MG tablet TAKE 1 TABLET BY MOUTH EVERY 2 HOURS AS NEEDED FOR MIGREAINE MAY REPEAT IN 2 HRS IF HEADACHE PERSIST 9 tablet 0 Past Month at Unknown time  Review of Systems   All systems reviewed and negative except as stated in HPI  Blood pressure 164/117, pulse 127, temperature 97.9 F (36.6 C), temperature source Oral, resp. rate 28, last menstrual period 11/08/2014, SpO2 100 %, unknown if currently breastfeeding. General appearance: alert, cooperative and severe distress, diaphoretic Lungs: clear to auscultation bilaterally Heart: regular rate and rhythm Abdomen: diffusely ttp mostly periumbilical, positive rebound and guarding Extremities: No calf swelling or tenderness Presentation: cephalic pr rn exam Fetal monitoring: 165/mod/-a/-d Uterine activity: irritability  Dilation: Fingertip Effacement (%): 90 Station: 0 Exam by:: Emily Rossetti, RN   Prenatal labs: ABO, Rh: --/--/O POS (07/05 [id number]) Antibody: NEG (07/05 0721) Rubella: !Error! RPR: NON REAC (05/15 1038)  HBsAg: NEGATIVE (02/20 1025)  HIV: NONREACTIVE (05/15 1038)  GBS:    1 hr Glucola: 92 Genetic screening:  Nips wnl Anatomy US: wnl  Prenatal Transfer Tool  Maternal Diabetes: No Genetic Screening: Normal Maternal Ultrasounds/Referrals: Normal Fetal Ultrasounds or other Referrals:  iugr Maternal Substance Abuse:  No Significant Maternal Medications:  Many - please see mother's chart Significant Maternal Lab Results: gbs unknown  Results for orders placed or performed during the hospital encounter of 07/12/15 (from the past 24 hour(s))  CBC   Collection Time: 07/12/15  5:30 AM  Result Value Ref Range   WBC 19.7 (H) 4.0 - 10.5 K/uL   RBC 4.23 3.87 - 5.11 MIL/uL   Hemoglobin 13.0 12.0 - 15.0 g/dL   HCT 37.2 36.0 - 46.0 %   MCV 87.9 78.0 - 100.0 fL   MCH 30.7 26.0 - 34.0 pg   MCHC 34.9 30.0 - 36.0  g/dL   RDW 14.0 11.5 - 15.5 %   Platelets 292 150 - 400 K/uL  Comprehensive metabolic panel   Collection Time: 07/12/15  5:30 AM  Result Value Ref Range   Sodium 135 135 - 145 mmol/L   Potassium 4.3 3.5 - 5.1 mmol/L   Chloride 107 101 - 111 mmol/L   CO2 19 (L) 22 - 32 mmol/L   Glucose, Bld 95 65 - 99 mg/dL   BUN 18 6 - 20 mg/dL   Creatinine, Ser 0.62 0.44 - 1.00 mg/dL   Calcium 8.6 (L) 8.9 - 10.3 mg/dL   Total Protein 6.2 (L) 6.5 - 8.1 g/dL   Albumin 2.8 (L) 3.5 - 5.0 g/dL   AST 19 15 - 41 U/L   ALT 11 (L) 14 - 54 U/L   Alkaline Phosphatase 139 (H) 38 - 126 U/L   Total Bilirubin 0.7 0.3 - 1.2 mg/dL   GFR calc non Af Amer >60 >60 mL/min   GFR calc Af Amer >60 >60 mL/min   Anion gap 9 5 - 15  Protein / creatinine ratio, urine   Collection Time: 07/12/15  5:45 AM  Result Value Ref Range   Creatinine, Urine 292.00 mg/dL   Total Protein, Urine 505 mg/dL   Protein Creatinine Ratio 1.73 (H) 0.00 - 0.15 mg/mg[Cre]  Urinalysis, Routine w reflex microscopic (not at [facility])   Collection Time: 07/12/15  5:45 AM  Result Value Ref Range   Color, Urine YELLOW YELLOW   APPearance CLEAR CLEAR   Specific Gravity, Urine >1.030 (H) 1.005 - 1.030   pH 6.0 5.0 - 8.0   Glucose, UA NEGATIVE NEGATIVE mg/dL   Hgb urine dipstick LARGE (A) NEGATIVE   Bilirubin Urine NEGATIVE NEGATIVE   Ketones, ur NEGATIVE NEGATIVE mg/dL   Protein, ur >300 (A) NEGATIVE mg/dL   Nitrite NEGATIVE NEGATIVE  Leukocytes, UA NEGATIVE NEGATIVE  Urine microscopic-add on   Collection Time: 07/12/15  5:45 AM  Result Value Ref Range   Squamous Epithelial / LPF 0-5 (A) NONE SEEN   WBC, UA 0-5 0 - 5 WBC/hpf   RBC / HPF 6-30 0 - 5 RBC/hpf   Bacteria, UA FEW (A) NONE SEEN  Glucose, capillary   Collection Time: 07/12/15  5:53 AM  Result Value Ref Range   Glucose-Capillary 100 (H) 65 - 99 mg/dL  CBC with Differential/Platelet   Collection Time: 07/12/15  7:21 AM  Result Value Ref Range   WBC 29.6 (H) 4.0 - 10.5 K/uL   RBC  4.27 3.87 - 5.11 MIL/uL   Hemoglobin 13.0 12.0 - 15.0 g/dL   HCT 37.4 36.0 - 46.0 %   MCV 87.6 78.0 - 100.0 fL   MCH 30.4 26.0 - 34.0 pg   MCHC 34.8 30.0 - 36.0 g/dL   RDW 14.1 11.5 - 15.5 %   Platelets 297 150 - 400 K/uL   Neutrophils Relative % 86 %   Neutro Abs 25.6 (H) 1.7 - 7.7 K/uL   Lymphocytes Relative 10 %   Lymphs Abs 2.9 0.7 - 4.0 K/uL   Monocytes Relative 3 %   Monocytes Absolute 1.0 0.1 - 1.0 K/uL   Eosinophils Relative 0 %   Eosinophils Absolute 0.1 0.0 - 0.7 K/uL   Basophils Relative 0 %   Basophils Absolute 0.0 0.0 - 0.1 K/uL  Lipase, blood   Collection Time: 07/12/15  7:21 AM  Result Value Ref Range   Lipase 41 11 - 51 U/L  Type and screen [place]   Collection Time: 07/12/15  7:21 AM  Result Value Ref Range   ABO/RH(D) O POS    Antibody Screen NEG    Sample Expiration 07/15/2015    Unit Number [id number]    Blood Component Type RBC LR PHER1    Unit division 00    Status of Unit REL FROM [facility]    Transfusion Status OK TO TRANSFUSE    Crossmatch Result COMPATIBLE    Unit tag comment VERBAL ORDERS PER DR DR Gala Romney    Unit Number [id number]    Blood Component Type RED CELLS,LR    Unit division 00    Status of Unit REL FROM [facility]    Transfusion Status OK TO TRANSFUSE    Crossmatch Result COMPATIBLE    Unit tag comment VERBAL ORDERS PER DR DR Gala Romney   CBC   Collection Time: 07/12/15  8:13 AM  Result Value Ref Range   WBC 29.3 (H) 4.0 - 10.5 K/uL   RBC 3.72 (L) 3.87 - 5.11 MIL/uL   Hemoglobin 11.0 (L) 12.0 - 15.0 g/dL   HCT 33.0 (L) 36.0 - 46.0 %   MCV 88.7 78.0 - 100.0 fL   MCH 29.6 26.0 - 34.0 pg   MCHC 33.3 30.0 - 36.0 g/dL   RDW 14.1 11.5 - 15.5 %   Platelets 272 150 - 400 K/uL    Patient Active Problem List   Diagnosis Date Noted  . PTSD (post-traumatic stress disorder) 05/16/2015  . Conversion disorder with seizures or convulsions 05/16/2015  . Severe episode of recurrent major depressive disorder, without  psychotic features ([place]) 05/16/2015  . GAD (generalized anxiety disorder) 05/16/2015  . Multiple sclerosis affecting pregnancy, antepartum ([place]) 05/10/2015  . Poor fetal growth, affecting management of mother, antepartum condition or complication [id number]  . Difficult intravenous access 05/08/2015  . Right optic neuritis  04/27/2015  . Migraine with aura and without status migrainosus, not intractable 03/28/2015  . Generalized anxiety disorder 03/28/2015  . Substance abuse affecting pregnancy, antepartum 03/08/2015  . Obesity complicating pregnancy, childbirth, or puerperium, antepartum 02/27/2015  . Tobacco smoking affecting pregnancy, antepartum 02/27/2015  . AMA (advanced maternal age) primigravida 35+ 02/27/2015  . HTN (hypertension) 02/27/2015  . Preexisting hypertension complicating pregnancy, antepartum 02/27/2015  . Perforated bowel ([place]) 02/27/2015  . Supervision of high-risk pregnancy 12/27/2014  . Depression 12/27/2014  . Multiple sclerosis ([place]) 12/27/2014  . Migraine 12/27/2014  . Seizure disorder ([place]) 12/27/2014  . Smoker 12/27/2014  . Obesity 12/27/2014    Assessment: Eugene L Forker is a 35 y.o. G1P0 at [redacted]w[redacted]d here for severe abdominal. Initially hypertensive, and remained hypertensive throughout. Fetal heart rate 160s, moderate variability, no accels. 1 L LR given, then maintenance fluids started. Labs obtained, significant for elevated WBCs. For non-reactive NST BPP obtained STAT, resulting 2/10. Ongoing severe abdominal pain unresponsive to iv opioids decision made to obtain stat CT of abdomen/pelvis. General surgery consulted. Shortly thereafter fetal heart rate noted to have decreased from baseline 160s to 120s, with minimal variability. Bedside ultrasound then revealed fetal bradycardia. Decision made to proceed to STAT c/s. General surgery notified, with plan to meed in OR.    Patsy Lager Wouk 07/12/2015, 9:27 AM    Called around 6:58 a.m. by Dr. Si Raider about patient  with report of severe abdominal pain, N/V/D and BPP of 2/10.  Informed of history of spontaneous bowel perforation in past due to chronic steroid use.  Besides being non reactive, no other fetal heart rate abnormalities were reported at this time.  CT ordered.  Dr. Burnett Harry called around 7:09 a.m. to make sure that I was aware of BPP of 2 and that patient was not on fetal monitor at the time of conversation.  I went immediately to the MAU and helped RN put patient on the monitor with help of bedside US.  FHT was baseline of 130 and distinct from mother.  Patient was hypertensive and verbal order for initiation of hypertensive protocol was given.  Protein Creatinine ratio was pending at this time.  CBC and CMP were normal.   Called general surgeon at [facility] for consult and was informed that [facility] general surgery backed up the [facility].  He did confirm that a CT without contrast would help with diagnosis of bowel perforation.  Dr. Rosendo Gros called and paged via [place].  He returned call at 7:31.  Since we were waiting for CT tech to arrive from [facility], he suggested a KUB to look for free air.  Order for KUB placed by Dr. Si Raider.  At this time I noted that patient was not on fetal monitor again and I went back into the patient room.  Pt sitting up right and anesthesiologist was attempting 2nd IV via US guidance.  IV placement was not succesful.  In lieu of trying again I suggested a central line in the OR since patient did have one good IV in left arm.  Pt laid back down, and I did another bedside US which showed FH around 100.  Stat cesarean section called.  Dr. Rosendo Gros called again and informed we were proceeding to OR and to meet [place] there.  Pt was consented for procedure which would include cesarean section and abdominal exploration.  Risks of procedure included but were not limited to: bleeding which may require transfusion or reoperation; infection which may require antibiotics; injury to bowel,  bladder, ureters or other  surrounding organs; injury to the fetus; need for additional procedures including hysterectomy in the event of a life-threatening hemorrhage, incisional problems, thromboembolic phenomenon and other postoperative/anesthesia complications. The patient concurred with the proposed plan.  We did not discuss BTL at the time of emergent cesarean section.   Due to urgent nature of cesarean section, written consent was not obtained.    Guss Bunde., MD

## 2015-07-12 NOTE — Op Note (Signed)
07/12/2015  11:49 AM  PATIENT:  Emily [name]  35 y.o. female  PRE-OPERATIVE DIAGNOSIS:  Acute abdomen, bradycardic fetus  POST-OPERATIVE DIAGNOSIS:  Hemoperitoneum, abdominal wall adhesions  PROCEDURE:  Procedure(s): Emergent Exploratory laparotomy  SURGEON:  Surgeon(s) and Role: Dr. Ralene Ok  ASSISTANTS: Dr. Veneda Melter   ANESTHESIA:   general  EBL:  Total I/O In: 2400 [I.V.:2400] Out: [id number] [Urine:275; Blood:1000]  BLOOD ADMINISTERED:none  DRAINS: none   LOCAL MEDICATIONS USED:  NONE  SPECIMEN:  No Specimen  DISPOSITION OF SPECIMEN:  N/A  COUNTS:  YES  TOURNIQUET:  * No tourniquets in log *  DICTATION: .Dragon Dictation Indications for procedure: Patient is a 35 year old female, [redacted] weeks pregnant. Patient presented to the ER secondary to acute abdominal pain for 6 hours. Patient was monitored while in the women's ER secondary to fetal distress patient was taken back to the operating room for emergent C-section. Patient did have a previous history of small bowel resection and anastomosis while on steroids for MS. This was ultimately followed by ventral hernia repair with mesh. It was thought the patient had these operations in approximately 2009.  Intraoperative consult was obtained  Details of procedure: Upon my entry the patient recently undergone a C-section via a lower midline incision. Dr. Gala Romney stated that there was some hemoperitoneum that was pulled in the cul-de-sac inferiorly. There is also some apparent blood dissection of the myometrium of the uterus. Her portion of the operation is dictated in separate cover. The midline incision was extended cephalad. There were some small bowel adhesions in omental adhesions to the midline. These were taken down sharply and with cautery maintaining hemostasis. There was an area of small bowel that was densely adherent to the anterior abdominal wall, this was taken down sharply using Metzenbaum scissors. Once the  midline incision was able to be freed up. All 4 quadrants were packed with 4 x 4's. The right upper quadrant appeared to be clear of any blood. The liver was palpated and appeared to be without injury. The left upper quadrant also appeared to be without blood. The left and right lower quadrants had some oozing, likely from the subcutaneous tissue/cesarean section. At this time the small bowel was eviscerated. I was able to run the small bowel from the ligament of Treitz to the terminal ileum. There is no perforation or bleeding coming from the small bowel. The right colon, transverse colon, left colon, and sigmoid rectum were visualized and seen to be without perforation or bleeding.  Upon re-visualizing the small bowel that was adherent to the anterior abdominal wall the area of the small bowel was imbricated in Lembert fashion using 3-0 silk's and interrupted standard fashion. This reapproximated the serosa. There was not any apparent mucosa could be seen however this was an attempt to prevent any delayed perforation.  The abdomen was then irrigated out with several liters of sterile saline. The midline fascia was reapproximated using a #1 PDS in a standard running fashion. The skin was closed with staples, and dressed with a honeycomb dressing. The patient tolerated the procedure well was taken to the recovery room in stable condition.    PLAN OF CARE: Admit to inpatient   PATIENT DISPOSITION:  PACU - hemodynamically stable.   Delay start of Pharmacological VTE agent (>24hrs) due to surgical blood loss or risk of bleeding: not applicable

## 2015-07-12 NOTE — Progress Notes (Signed)
I offered emotional and spiritual support to pt and her parents as they were beginning to process all that happened with the birth of her baby.  They are coping well and are in good spirits.  Calin has a significant medical history and shared that this surgery was "nothing" compared with some of her other surgeries that she has been through.  Amilya's mother shared with me privately that Shamyia's "in-laws" had to move out yesterday and that that was very stressful.  She also let me know that Mairim has a significant mental health history as well.  Pt's mother plans to stay with Alexander for awhile after she is home so that she can help her and she stated that she will be on the lookout for any PMAD as well.    Jolana had not yet been down to see her baby and does not yet have much of an understanding about her daughter's critical condition other than that she is small and premature.  They shared a family story about her great-grandmother who was born very prematurely and did well.  I offered emotional support and reflective listening as they shared Ahliyah's birth story.  [place], Bcc Pager, [phone] 2:15 PM

## 2015-07-12 NOTE — MAU Note (Signed)
Rapid response nurse called back to rm

## 2015-07-13 ENCOUNTER — Ambulatory Visit (HOSPITAL_COMMUNITY): Admission: RE | Admit: 2015-07-13 | Payer: Self-pay | Source: Ambulatory Visit

## 2015-07-13 LAB — CBC
HEMATOCRIT: 27.5 % — AB (ref 36.0–46.0)
HEMOGLOBIN: 9.4 g/dL — AB (ref 12.0–15.0)
MCH: 30.4 pg (ref 26.0–34.0)
MCHC: 34.2 g/dL (ref 30.0–36.0)
MCV: 89 fL (ref 78.0–100.0)
Platelets: 251 10*3/uL (ref 150–400)
RBC: 3.09 MIL/uL — ABNORMAL LOW (ref 3.87–5.11)
RDW: 14.7 % (ref 11.5–15.5)
WBC: 17 10*3/uL — ABNORMAL HIGH (ref 4.0–10.5)

## 2015-07-13 LAB — RPR: RPR Ser Ql: NONREACTIVE

## 2015-07-13 MED ORDER — AMLODIPINE BESYLATE 5 MG PO TABS
5.0000 mg | ORAL_TABLET | Freq: Every day | ORAL | Status: DC
  Administered 2015-07-13 – 2015-07-15 (×3): 5 mg via ORAL
  Filled 2015-07-13 (×4): qty 1

## 2015-07-13 MED ORDER — HYDROMORPHONE HCL 2 MG PO TABS
2.0000 mg | ORAL_TABLET | ORAL | Status: DC | PRN
  Administered 2015-07-13 (×3): 2 mg via ORAL
  Filled 2015-07-13 (×4): qty 1

## 2015-07-13 MED ORDER — HYDROCHLOROTHIAZIDE 25 MG PO TABS
25.0000 mg | ORAL_TABLET | Freq: Every day | ORAL | Status: DC
  Administered 2015-07-13 – 2015-07-15 (×3): 25 mg via ORAL
  Filled 2015-07-13 (×3): qty 1

## 2015-07-13 MED ORDER — PROMETHAZINE HCL 25 MG PO TABS
25.0000 mg | ORAL_TABLET | Freq: Four times a day (QID) | ORAL | Status: DC | PRN
  Administered 2015-07-13 – 2015-07-14 (×2): 25 mg via ORAL
  Filled 2015-07-13 (×2): qty 1

## 2015-07-13 NOTE — Progress Notes (Signed)
1 Day Post-Op  Subjective: Pt mobilizing well.  Tol Reg diet.  No n/v  Objective: Vital signs in last 24 hours: Temp:  [97.5 F (36.4 C)-98.6 F (37 C)] 97.9 F (36.6 C) (07/06 0200) Pulse Rate:  [79-136] 106 (07/06 0600) Resp:  [11-34] 19 (07/06 0600) BP: (113-166)/(69-117) 136/103 mmHg (07/06 0600) SpO2:  [97 %-100 %] 100 % (07/06 0600) Weight:  [113.399 kg (250 lb)-116.007 kg (255 lb 12 oz)] 116.007 kg (255 lb 12 oz) (07/06 0500) Last BM Date: 07/12/15  Intake/Output from previous day: 07/05 0701 - 07/06 0700 In: 11709.8 [P.O.:2050; I.V.:9559.8; IV Piggyback:100] Out: 4925 [Urine:3925; Blood:1000] Intake/Output this shift:    General appearance: alert and cooperative GI: soft, approp ttp , gauze c/d/i  Lab Results:   Recent Labs  07/12/15 1552 07/13/15 0511  WBC 29.2* 17.0*  HGB 10.7* 9.4*  HCT 30.4* 27.5*  PLT 264 251   BMET  Recent Labs  07/12/15 0530  NA 135  K 4.3  CL 107  CO2 19*  GLUCOSE 95  BUN 18  CREATININE 0.62  CALCIUM 8.6*   PT/INR No results for input(s): LABPROT, INR in the last 72 hours. ABG No results for input(s): PHART, HCO3 in the last 72 hours.  Invalid input(s): PCO2, PO2  Studies/Results: Dg Chest Port 1 View  07/12/2015  CLINICAL DATA:  Encounter for central line placement EXAM: PORTABLE CHEST 1 VIEW COMPARISON:  05/10/2014 FINDINGS: Right IJ central line with tip at the SVC level. No pneumothorax or concerning mediastinal widening. Normal heart size and aortic contours. There is no edema, consolidation, effusion. IMPRESSION: Right IJ line without adverse finding. Electronically Signed   By: Monte Fantasia M.D.   On: 07/12/2015 10:34   [place] Mfm Fetal Bpp Wo Non Stress  07/12/2015  OBSTETRICAL ULTRASOUND: This exam was performed within a [place] Ultrasound Department. The OB US report was generated in the AS system, and faxed to the ordering physician.  This report is available in the [facility]. See the AS Obstetric US report  via the Image Link.  [place] Mfm Ob Limited  07/12/2015  OBSTETRICAL ULTRASOUND: This exam was performed within a [place] Ultrasound Department. The OB US report was generated in the AS system, and faxed to the ordering physician.  This report is available in the [facility]. See the AS Obstetric US report via the Image Link.  [place] Mfm Ua Cord Doppler  07/12/2015  OBSTETRICAL ULTRASOUND: This exam was performed within a [place] Ultrasound Department. The OB US report was generated in the AS system, and faxed to the ordering physician.  This report is available in the [facility]. See the AS Obstetric US report via the Image Link.   Anti-infectives: Anti-infectives    Start     Dose/Rate Route Frequency Ordered Stop   07/12/15 1300  cefoTEtan (CEFOTAN) 1 g in dextrose 5 % 50 mL IVPB     1 g 100 mL/hr over 30 Minutes Intravenous Every 12 hours 07/12/15 1249 07/12/15 2229   07/12/15 0800  ceFAZolin (ANCEF) 3 g in dextrose 5 % 50 mL IVPB  Status:  Discontinued     3 g 130 mL/hr over 30 Minutes Intravenous  Once 07/12/15 0747 07/12/15 1147      Assessment/Plan: s/p Procedure(s): CESAREAN SECTION (N/A) Adv diet as tol Sta[place] on in 7-10d.  Can f/u with me as needed Call with questions   LOS: 1 day    Rosario Jacks., Anne Hahn 07/13/2015

## 2015-07-13 NOTE — Progress Notes (Signed)
UR chart review completed.  

## 2015-07-13 NOTE — Progress Notes (Signed)
Initial visit with Emily [name] upon referral from [facility].  Emily [name] shared that [name] is doing well and will hopefully not need another dose of antibiotics and be able to have the cooling blanket dcd on Saturday.  I held space while she shared the story of her delivery and how frightening it was.  Emily [name] also shared that she was told years ago that she could never have children due to trauma on her cervix.  Her sister told her that God has a big purpose for Emily [name] and she is so happy that her baby is here.  She has good support in her parents and sister and her fiance's family is supportive as well.  We had prayer at the family's request.  Will continue to follow.  Please page as further needs arise.  Donald Prose. Elyn Peers, M.Div. [facility] Chaplain Pager [phone] Office [phone]     07/13/15 1529  Clinical Encounter Type  Visited With Patient and family together  Visit Type Social support  Referral From Chaplain  Consult/Referral To Chaplain  Spiritual Encounters  Spiritual Needs Prayer;Emotional  Stress Factors  Patient Stress Factors Major life changes

## 2015-07-13 NOTE — Progress Notes (Addendum)
Subjective: Postpartum Day 1: Cesarean Delivery and exploration of bowel Patient reports incisional pain and tolerating PO.   Casemanager/SW in with patient  Objective: Vital signs in last 24 hours: Temp:  [97.6 F (36.4 C)-98.6 F (37 C)] 98 F (36.7 C) (07/06 0816) Pulse Rate:  [79-111] 104 (07/06 0900) Resp:  [11-34] 16 (07/06 1000) BP: (113-163)/(69-112) 141/106 mmHg (07/06 0900) SpO2:  [98 %-100 %] 99 % (07/06 1000) Weight:  [250 lb (113.399 kg)-255 lb 12 oz (116.007 kg)] 255 lb 12 oz (116.007 kg) (07/06 0500) Filed Vitals:   07/13/15 0816 07/13/15 0819 07/13/15 0900 07/13/15 1000  BP: 139/100  141/106   Pulse: 111 109 104   Temp: 98 F (36.7 C)     TempSrc: Oral     Resp: 14  16 16   Height:      Weight:      SpO2: 99% 99% 100% 99%    Physical Exam:  General: alert, cooperative and no distress Lochia: appropriate Uterine Fundus: firm Incision: no significant drainage, no dehiscence DVT Evaluation: No evidence of DVT seen on physical exam.   Recent Labs  07/12/15 1552 07/13/15 0511  HGB 10.7* 9.4*  HCT 30.4* 27.5*    Assessment/Plan: Status post Cesarean section. Doing well postoperatively. Plan discontinue Magnesium Sulfate infusion and begin PO antihypertensives and analgesics  Norvasc 5mg  po qd per Dr Elly Modena HCTZ 25mg  po qd per Dr Elly Modena Switch from PCA to PO Dilaudid Move to floor after Mag discontinued.  Hansel Feinstein 07/13/2015, 10:07 AM
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
## 2015-07-14 NOTE — Care Management Important Message (Signed)
Important Message  Patient Details  Name: Emily [name] MRN: [id number] Date of Birth: 1980/03/28   Medicare Important Message Given:  Yes    Shelda Altes 07/14/2015, 1:21 Imogene Message  Patient Details  Name: Emily [name] MRN: [id number] Date of Birth: Aug 27, 1980   Medicare Important Message Given:  Yes    Shelda Altes 07/14/2015, 1:20 PM

## 2015-07-15 LAB — TYPE AND SCREEN
ABO/RH(D): O POS
Antibody Screen: NEGATIVE
UNIT DIVISION: 0
UNIT DIVISION: 0
UNIT DIVISION: 0
Unit division: 0

## 2015-07-15 MED ORDER — OXYCODONE-ACETAMINOPHEN 5-325 MG PO TABS: 1.0000 | ORAL_TABLET | Freq: Four times a day (QID) | ORAL | Status: DC | PRN

## 2015-07-15 MED ORDER — AMLODIPINE BESYLATE 5 MG PO TABS: 5.0000 mg | ORAL_TABLET | Freq: Every day | ORAL | Status: DC

## 2015-07-15 MED ORDER — SIMETHICONE 80 MG PO CHEW: 80.0000 mg | CHEWABLE_TABLET | Freq: Four times a day (QID) | ORAL | Status: DC | PRN

## 2015-07-15 MED ORDER — HYDROCHLOROTHIAZIDE 25 MG PO TABS: 25.0000 mg | ORAL_TABLET | Freq: Every day | ORAL | Status: DC

## 2015-07-15 MED ORDER — BISACODYL 10 MG RE SUPP
10.0000 mg | Freq: Once | RECTAL | Status: AC
  Administered 2015-07-15: 10 mg via RECTAL
  Filled 2015-07-15: qty 1

## 2015-07-15 NOTE — Progress Notes (Addendum)
Daily Post Partum Note  Emily [name] is a 35 y.o. G1P0101  POD#3 s/p c-section @ [redacted]w[redacted]d non reassuring fetal status.  Pregnancy c/b BMI low 40s, h/o MS, h/o THC use, chronic HTN, tobacco use, AMA 24hr/overnight events:  None  Subjective:  No fevers, chills, nausea, vomiting, chest pain, SOB. +PO intake w/o issue, flatus, ambulation with cane and pain controlled with PO meds  Objective:   Filed Vitals:   07/15/15 0759 07/15/15 0925 07/15/15 1030 07/15/15 1324  BP:  145/106 135/77 135/83  Pulse:  115 98 109  Temp:  98.7 F (37.1 C) 98.6 F (37 C) 98.7 F (37.1 C)  TempSrc:  Oral Oral Oral  Resp:  20 20 18   Height:      Weight: 111.698 kg (246 lb 4 oz)     SpO2:  100% 98% 99%     Current Vital Signs 24h Vital Sign Ranges  T 98.7 F (37.1 C) Temp  Avg: 98.7 F (37.1 C)  Min: 98.2 F (36.8 C)  Max: 99.4 F (37.4 C)  BP 135/83 mmHg BP  Min: 120/81  Max: 145/106  HR (!) 109 Pulse  Avg: 108  Min: 98  Max: 117  RR 18 Resp  Avg: 19.7  Min: 18  Max: 24  SaO2 99 % Not Delivered SpO2  Avg: 99.4 %  Min: 98 %  Max: 100 %       24 Hour I/O Current Shift I/O  Time Ins Outs        General: NAD Abdomen: obese. Dressing removed. +BS, soft, NTTP, ND. C/d/i vertical midline incision with staples in place. No e/o separation or infection Perineum: deferred Skin:  Warm and dry.  Cardiovascular: S1, S2 normal, no murmur, rub or gallop, regular rate and rhythm Respiratory:  Clear to auscultation bilateral. Normal respiratory effort Neuro: ambulates well with cane.   Medications Current Facility-Administered Medications  Medication Dose Route Frequency Provider Last Rate Last Dose  . acetaminophen (TYLENOL) tablet 650 mg  650 mg Oral Q4H PRN Gwynne Edinger, MD      . albuterol (PROVENTIL) (2.5 MG/3ML) 0.083% nebulizer solution 2.5 mg  2.5 mg Inhalation Q6H PRN Gwynne Edinger, MD      . amLODipine (NORVASC) tablet 5 mg  5 mg Oral Daily Seabron Spates, CNM   5 mg at 07/15/15  [id number]  . coconut oil  1 application Topical PRN Gwynne Edinger, MD      . witch hazel-glycerin (TUCKS) pad 1 application  1 application Topical PRN Gwynne Edinger, MD       And  . dibucaine (NUPERCAINAL) 1 % rectal ointment 1 application  1 application Rectal PRN Gwynne Edinger, MD      . diphenhydrAMINE (BENADRYL) capsule 25 mg  25 mg Oral Q6H PRN Gwynne Edinger, MD      . FLUoxetine [facility]) tablet 20 mg  20 mg Oral Daily Gwynne Edinger, MD   20 mg at 07/15/15 [id number]  . hydrALAZINE (APRESOLINE) injection 10 mg  10 mg Intravenous Once PRN Gwynne Edinger, MD   10 mg at 07/13/15 0204  . hydrochlorothiazide (HYDRODIURIL) tablet 25 mg  25 mg Oral Daily Seabron Spates, CNM   25 mg at 07/15/15 [id number]  . HYDROmorphone (DILAUDID) tablet 2 mg  2 mg Oral Q3H PRN Seabron Spates, CNM   2 mg at 07/13/15 1925  . labetalol (NORMODYNE,TRANDATE) injection 20-80 mg  20-80 mg Intravenous Q10 min PRN Olen Cordial  Phil Dopp, MD   20 mg at 07/13/15 0204  . lactated ringers infusion   Intravenous Continuous Jonnie Kind, MD   Stopped at 07/13/15 1045  . menthol-cetylpyridinium (CEPACOL) lozenge 3 mg  1 lozenge Oral Q2H PRN Gwynne Edinger, MD      . oxyCODONE (Oxy IR/ROXICODONE) immediate release tablet 10 mg  10 mg Oral Q4H PRN Gwynne Edinger, MD   10 mg at 07/14/15 1446  . oxyCODONE (Oxy IR/ROXICODONE) immediate release tablet 5 mg  5 mg Oral Q4H PRN Gwynne Edinger, MD   5 mg at 07/15/15 1303  . prenatal multivitamin tablet 1 tablet  1 tablet Oral Brewton Wouk, MD   1 tablet at 07/15/15 1303  . promethazine (PHENERGAN) tablet 25 mg  25 mg Oral Q6H PRN Seabron Spates, CNM   25 mg at 07/14/15 0600  . senna-docusate (Senokot-S) tablet 2 tablet  2 tablet Oral Q24H Gwynne Edinger, MD   2 tablet at 07/15/15 1303  . simethicone (MYLICON) chewable tablet 80 mg  80 mg Oral TID PC Gwynne Edinger, MD   80 mg at 07/15/15 1303  . simethicone (MYLICON) chewable tablet 80 mg  80 mg Oral  Q24H Gwynne Edinger, MD   80 mg at 07/14/15 2315  . simethicone (MYLICON) chewable tablet 80 mg  80 mg Oral PRN Gwynne Edinger, MD      . Tdap [facility]) injection 0.5 mL  0.5 mL Intramuscular Once [facility], MD   0.5 mL at 07/13/15 1000  . zolpidem (AMBIEN) tablet 5 mg  5 mg Oral QHS PRN Gwynne Edinger, MD   5 mg at 07/14/15 2315   Facility-Administered Medications Ordered in Other Encounters  Medication Dose Route Frequency Provider Last Rate Last Dose  . dexamethasone (DECADRON) injection 10 mg  10 mg Intravenous Once Milton Ferguson, MD      . diphenhydrAMINE (BENADRYL) injection 50 mg  50 mg Intravenous Once Milton Ferguson, MD      . HYDROmorphone (DILAUDID) injection 1 mg  1 mg Intravenous Once Milton Ferguson, MD      . metoCLOPramide (REGLAN) injection 10 mg  10 mg Intravenous Once Milton Ferguson, MD        Labs:   Recent Labs Lab 07/12/15 0813 07/12/15 1552 07/13/15 0511  WBC 29.3* 29.2* 17.0*  HGB 11.0* 10.7* 9.4*  HCT 33.0* 30.4* 27.5*  PLT 272 264 251    Recent Labs Lab 07/12/15 0530  NA 135  K 4.3  CL 107  CO2 19*  BUN 18  CREATININE 0.62  GLUCOSE 95  CALCIUM 8.6*    Assessment & Plan:  Pt doing well *Postpartum/postop: routine care. 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 adequately controlled on hctz and norvasc. No e/o pre-eclampsia *Neuro: pt states she started to have flare like s/s about 3 weeks ago with some pain and numbness in her hands and feet and optic neuritis like s/s bilaterally. She was being worked up by her outpatient Neuro with plans for 7/13 MRI and 9/12 appt. It looks like a neurology consult was placed but unsure if they were actually called on day of admit. Will call them and see how they would like to proceed *Dispo: pending neuro  evaluation Durene Romans. MD Attending  Center for [employer] Fish farm manager)

## 2015-07-15 NOTE — Progress Notes (Signed)
D/c instructions and prescriptions reviewed with patient and family. Pt will call Radiology and the clinic on Monday and schedule her appts. Pt verbalized understanding. Pt wants to visit with baby in NICU , instructed  when she is ready to walk out let the nurse know.

## 2015-07-15 NOTE — Progress Notes (Signed)
CM received request for shower stool for pt discharging as son as stool can be delivered. CM called [facility] DME rep, Germaine to please deliver the shower stool so pt can discharge.

## 2015-07-15 NOTE — Discharge Instructions (Signed)
You should expect a call from our nurses early next week to tell you about your staple removal appointment Call Radiology on Monday to see if you can still have the MRI with staples in place.

## 2015-07-15 NOTE — Evaluation (Signed)
Physical Therapy Evaluation Patient Details Name: Emily [name] MRN: [id number] DOB: [birth year] Today's Date: 07/15/2015   History of Present Illness  pt presents postpartum with a hx of MS and multiple abdominal surgeries.    Clinical Impression  Pt tearful and seemingly anxious during eval.  Pt able to mobilize without A or deficits, just guarded from c-section pain.  Pt indicates having good support from her family and all needed DME.  Pt ed on continued mobility and the need to f/u with her MD about resuming her MS medications.  No further PT needs at this time.  Will sign off.      Follow Up Recommendations No PT follow up;Supervision - Intermittent    Equipment Recommendations  None recommended by PT    Recommendations for Other Services       Precautions / Restrictions Precautions Precautions: Other (comment) Precaution Comments: 5lb lifting restriction post C-section Restrictions Weight Bearing Restrictions: No      Mobility  Bed Mobility Overal bed mobility: Modified Independent                Transfers Overall transfer level: Modified independent Equipment used: None                Ambulation/Gait Ambulation/Gait assistance: Modified independent (Device/Increase time) Ambulation Distance (Feet): 200 Feet Assistive device: None Gait Pattern/deviations: Step-through pattern;Decreased stride length     General Gait Details: pt moves slowly and guarded, but no deficits noted.    Stairs            Wheelchair Mobility    Modified Rankin (Stroke Patients Only)       Balance Overall balance assessment: Modified Independent                                           Pertinent Vitals/Pain Pain Assessment: 0-10 Pain Score: 5  Pain Location: Incision Pain Descriptors / Indicators: Grimacing;Guarding Pain Intervention(s): Monitored during session;Premedicated before session;Repositioned    Home Living Family/patient  expects to be discharged to:: Private residence Living Arrangements: Spouse/significant other Available Help at Discharge: Family;Available PRN/intermittently Type of Home: Mobile home Home Access: Stairs to enter Entrance Stairs-Rails: Left Entrance Stairs-Number of Steps: 3 Home Layout: One level Home Equipment: Walker - 2 wheels;Cane - single point;Shower seat;Wheelchair - manual      Prior Function Level of Independence: Independent               Hand Dominance        Extremity/Trunk Assessment   Upper Extremity Assessment: Overall WFL for tasks assessed           Lower Extremity Assessment: RLE deficits/detail;LLE deficits/detail RLE Deficits / Details: Sensation changes in Bil feet for past 3 weeks. LLE Deficits / Details: Sensation changes in Bil feet for past 3 weeks.     Communication   Communication: No difficulties  Cognition Arousal/Alertness: Awake/alert Behavior During Therapy: Anxious (Labile) Overall Cognitive Status: Within Functional Limits for tasks assessed                      General Comments      Exercises        Assessment/Plan    PT Assessment Patent does not need any further PT services  PT Diagnosis Difficulty walking   PT Problem List    PT Treatment Interventions     PT  Goals (Current goals can be found in the Care Plan section) Acute Rehab PT Goals Patient Stated Goal: Get home with baby PT Goal Formulation: All assessment and education complete, DC therapy    Frequency     Barriers to discharge        Co-evaluation               End of Session   Activity Tolerance: Patient tolerated treatment well Patient left: in bed;with call bell/phone within reach (Sitting EOB) Nurse Communication: Mobility status         Time: [id number] PT Time Calculation (min) (ACUTE ONLY): 37 min   Charges:   PT Evaluation $PT Eval Low Complexity: 1 Procedure PT Treatments $Gait Training: 8-22  mins $Therapeutic Activity: 8-22 mins   PT G CodesCatarina Hartshorn, [place] 07/15/2015, 5:13 PM

## 2015-07-15 NOTE — Progress Notes (Signed)
OB Note I called Dr. Shon Hale (Neuro consults) and he said that since s/s are stable and been going on for three weeks that she's out of the window for steroids. I'll send a note to Dr. Delice Lesch letting her know that the patient is now postpartum and will tell pt to still try and keep that 7/13 MRI appointment. I placed a consult to PT for eval and d/w house coverage to see if they can see her on the weekend.   Durene Romans MD Attending Center for [employer] Fish farm manager)

## 2015-07-15 NOTE — Discharge Summary (Signed)
OB Discharge Summary     Patient Name: Emily [name] DOB: [birth year] MRN: [id number]  Primary [facility]: [facility]  Date of admission: 07/12/2015  Date of discharge: 07/15/2015   Admitting diagnosis:  *Intrauterine pregnancy at 35/1 weeks *Abdominal pain *Non-reactive NST and BPP 2/10 *Sudden onset fetal bradycardia   Secondary diagnosis: *Multiple Sclerosis with chronic flare *AMA *Chronic HTN *FGR *PTSD, anxiety, depression *History of substance abuse *History of seizure disorder     Discharge diagnosis: s/p stat cesarean section. chronic MS flare                                                                                                Post partum procedures:none  Augmentation: none  Complications: need for stat cesarean section. GSU consulted intra-op for concern for bowel injury  Delivering Clinician: G[facility] course:  Patient underwent stat c-section on HD#1 and had GSU consulted intra-op for concern for hemoperitoneum of unknown cause. See op notes but stat LTCS via vertical midline incision done with GSU oversewning area of bowel at risk for future perforation. EBL 104mL total. She was diagnosed with pre-eclampsia with severe features and put on 24hrs of PP Mg due to acute BP elevation and new onset proteinuria.. Patient had routine post op course and was discharged to home on POD#3; she was meeting all PP and post op goals including flatus; no BMs yet. Neurology was informally consulted about need for anything done while inpatient and they didn't recommend anything except to continue with her follow up with her primary Neurologist. Patient seen by PT prior to discharge to identify any needs while at home. Rhogam: not applicable  Physical exam  No data found.  General: NAD Abdomen: firm fundus below the umbilicus. NTTP. Clean, dry, and intact incision with staples in place and no e/o infection or separation.  +BS, obese, mildly distended.  Pulmonary: CTAB CV: S1, S2 normal, no murmur, rub or gallop, regular rate and rhythm  Labs: CBC Latest Ref Rng 07/13/2015 07/12/2015 07/12/2015  WBC 4.0 - 10.5 K/uL 17.0(H) 29.2(H) 29.3(H)  Hemoglobin 12.0 - 15.0 g/dL 9.4(L) 10.7(L) 11.0(L)  Hematocrit 36.0 - 46.0 % 27.5(L) 30.4(L) 33.0(L)  Platelets 150 - 400 K/uL 251 264 272   CMP Latest Ref Rng 07/12/2015  Glucose 65 - 99 mg/dL 95  BUN 6 - 20 mg/dL 18  Creatinine 0.44 - 1.00 mg/dL 0.62  Sodium 135 - 145 mmol/L 135  Potassium 3.5 - 5.1 mmol/L 4.3  Chloride 101 - 111 mmol/L 107  CO2 22 - 32 mmol/L 19(L)  Calcium 8.9 - 10.3 mg/dL 8.6(L)  Total Protein 6.5 - 8.1 g/dL 6.2(L)  Total Bilirubin 0.3 - 1.2 mg/dL 0.7  Alkaline Phos 38 - 126 U/L 139(H)  AST 15 - 41 U/L 19  ALT 14 - 54 U/L 11(L)   Discharge instruction: per After Visit Summary and "Baby and Me Booklet".  After visit meds:    Medication List    STOP taking these medications        aspirin 81 MG tablet  cephALEXin 500 MG capsule  Commonly known as:  KEFLEX     folic acid 1 MG tablet  Commonly known as:  FOLVITE      TAKE these medications        Adjustable Wrist Brace Misc  1 each by Does not apply route as directed. 1 right cock-up brace - wrist.     albuterol 108 (90 Base) MCG/ACT inhaler  Commonly known as:  PROVENTIL HFA;VENTOLIN HFA  Inhale 2 puffs into the lungs every 6 (six) hours as needed for wheezing or shortness of breath.     amLODipine 5 MG tablet  Commonly known as:  NORVASC  Take 1 tablet (5 mg total) by mouth daily.     busPIRone 5 MG tablet  Commonly known as:  BUSPAR  Take 1 tablet (5 mg total) by mouth 2 (two) times daily.     cyclobenzaprine 10 MG tablet  Commonly known as:  FLEXERIL  TAKE 1 TABLET BY MOUTH 3 TIMES DAILY AS NEEDED FOR MUSCLE SPASMS.     diazepam 5 MG tablet  Commonly known as:  VALIUM  Take 1 tablet as needed for panic attacks leading to seizures. Do not take more than 5 a month      diphenhydrAMINE 25 MG tablet  Commonly known as:  SOMINEX  Take 25 mg by mouth at bedtime as needed for sleep.     EPIPEN 0.3 mg/0.3 mL Soaj injection  Generic drug:  EPINEPHrine  Inject 0.3 mg into the muscle as needed (allergic reaction). Reported on 03/28/2015     FLUoxetine 20 MG tablet  Commonly known as:  PROZAC  Take 1 tablet (20 mg total) by mouth daily.     hydrochlorothiazide 25 MG tablet  Commonly known as:  HYDRODIURIL  Take 1 tablet (25 mg total) by mouth daily.     labetalol 200 MG tablet  Commonly known as:  NORMODYNE  Take 2 tablets (400 mg total) by mouth 2 (two) times daily.     MULTIVITAMIN ADULT PO  Take 1 tablet by mouth daily.     oxyCODONE-acetaminophen 5-325 MG tablet  Commonly known as:  ROXICET  Take 1-2 tablets by mouth every 6 (six) hours as needed for severe pain.     promethazine 25 MG tablet  Commonly known as:  PHENERGAN  Take 0.5-1 tablets (12.5-25 mg total) by mouth every 6 (six) hours as needed for nausea.     ranitidine 300 MG capsule  Commonly known as:  ZANTAC  Take 300 mg by mouth at bedtime.     simethicone 80 MG chewable tablet  Commonly known as:  MYLICON  Chew 1 tablet (80 mg total) by mouth 4 (four) times daily as needed for flatulence.     SUMAtriptan 100 MG tablet  Commonly known as:  IMITREX  TAKE 1 TABLET BY MOUTH EVERY 2 HOURS AS NEEDED FOR MIGREAINE MAY REPEAT IN 2 HRS IF HEADACHE PERSIST        Diet: routine diet  Activity: Advance as tolerated. Pelvic rest for 6 weeks.   Outpatient follow up: 1wk for BP check and staple removal appointment. Request sent to clinical pool on day of discharge Follow up Appt: Future Appointments Date Time Provider [place]  07/27/2015 3:30 PM Charlcie Cradle, MD [facility] None  09/20/2015 3:30 PM Cameron Sprang, MD [facility] None   Postpartum contraception: Not Discussed; can d/w pt at staple removal/BP check visit  Newborn Data: Live born female  Birth Weight: 3 lb  3.9 oz  (1470 g) APGAR: 2, 5  Baby Feeding: Bottle Disposition:NICU   07/18/2015 Aletha Halim, MD

## 2015-07-18 ENCOUNTER — Telehealth: Payer: Self-pay | Admitting: Neurology

## 2015-07-18 DIAGNOSIS — R202 Paresthesia of skin: Secondary | ICD-10-CM

## 2015-07-18 DIAGNOSIS — G35 Multiple sclerosis: Secondary | ICD-10-CM

## 2015-07-18 NOTE — Telephone Encounter (Signed)
Thanks for the update, since she is now post-partum, we can order MRI brain wwo contrast.         ----- Message -----     From: Aletha Halim, MD     Sent: 07/18/2015 11:48 AM      To: Cameron Sprang, MD        Hey, Dr. Delice Lesch. I just wanted to forward Ms. Morrical' DC summary to you. She had a preterm delivery and still having some s/s of her chronic MS flare. I told her to that I'd let you know she is now PP and I told her to call radiology to see if she can still have her MRI with her staples in place at her skin incision. Thanks and let me know any questions    Durene Romans MD    Attending    [facility])    ORDER ENTERED.

## 2015-07-19 ENCOUNTER — Encounter: Payer: Self-pay | Admitting: Advanced Practice Midwife

## 2015-07-19 LAB — URINE DRUGS OF ABUSE SCREEN W ALC, ROUTINE (REF LAB)
Barbiturate, Ur: NEGATIVE ng/mL
COCAINE (METAB.): NEGATIVE ng/mL
ETHANOL U, QUAN: NEGATIVE %
METHADONE SCREEN, URINE: NEGATIVE ng/mL
PROPOXYPHENE, URINE: NEGATIVE ng/mL
Phencyclidine, Ur: NEGATIVE ng/mL

## 2015-07-19 LAB — AMPHETAMINE CONF, UR: Amphetamines: NEGATIVE

## 2015-07-19 LAB — PANEL 799049
CARBOXY THC GC/MS CONF: UNDETERMINED ng/mL
Cannabinoid GC/MS, Ur: UNDETERMINED

## 2015-07-19 LAB — OPIATES CONFIRMATION, URINE: OPIATES: NEGATIVE

## 2015-07-20 ENCOUNTER — Ambulatory Visit (HOSPITAL_COMMUNITY): Payer: Medicare Other

## 2015-07-21 ENCOUNTER — Encounter (HOSPITAL_COMMUNITY): Payer: Self-pay | Admitting: *Deleted

## 2015-07-21 ENCOUNTER — Inpatient Hospital Stay (HOSPITAL_COMMUNITY)
Admission: AD | Admit: 2015-07-21 | Discharge: 2015-07-21 | Disposition: A | Discharge status: Home / Self Care | Payer: Medicare Other | Source: Ambulatory Visit | Attending: Obstetrics & Gynecology | Admitting: Obstetrics & Gynecology

## 2015-07-21 ENCOUNTER — Inpatient Hospital Stay (HOSPITAL_COMMUNITY): Payer: Medicare Other

## 2015-07-21 ENCOUNTER — Ambulatory Visit: Payer: Medicare Other

## 2015-07-21 VITALS — BP 83/28 | HR 92

## 2015-07-21 DIAGNOSIS — Z79899 Other long term (current) drug therapy: Secondary | ICD-10-CM | POA: Insufficient documentation

## 2015-07-21 DIAGNOSIS — Z0130 Encounter for examination of blood pressure without abnormal findings: Secondary | ICD-10-CM

## 2015-07-21 DIAGNOSIS — A419 Sepsis, unspecified organism: Secondary | ICD-10-CM

## 2015-07-21 DIAGNOSIS — F17210 Nicotine dependence, cigarettes, uncomplicated: Secondary | ICD-10-CM | POA: Insufficient documentation

## 2015-07-21 DIAGNOSIS — R197 Diarrhea, unspecified: Secondary | ICD-10-CM

## 2015-07-21 DIAGNOSIS — O900 Disruption of cesarean delivery wound: Secondary | ICD-10-CM

## 2015-07-21 DIAGNOSIS — R1032 Left lower quadrant pain: Secondary | ICD-10-CM | POA: Diagnosis not present

## 2015-07-21 DIAGNOSIS — K631 Perforation of intestine (nontraumatic): Secondary | ICD-10-CM

## 2015-07-21 DIAGNOSIS — G35 Multiple sclerosis: Secondary | ICD-10-CM | POA: Diagnosis not present

## 2015-07-21 DIAGNOSIS — O8612 Endometritis following delivery: Secondary | ICD-10-CM | POA: Diagnosis not present

## 2015-07-21 DIAGNOSIS — I959 Hypotension, unspecified: Secondary | ICD-10-CM | POA: Insufficient documentation

## 2015-07-21 DIAGNOSIS — O99355 Diseases of the nervous system complicating the puerperium: Secondary | ICD-10-CM | POA: Insufficient documentation

## 2015-07-21 DIAGNOSIS — G8918 Other acute postprocedural pain: Secondary | ICD-10-CM

## 2015-07-21 HISTORY — DX: Multiple sclerosis: G35

## 2015-07-21 LAB — CBC WITH DIFFERENTIAL/PLATELET
Basophils Absolute: 0 10*3/uL (ref 0.0–0.1)
Basophils Relative: 0 %
Eosinophils Absolute: 0 10*3/uL (ref 0.0–0.7)
Eosinophils Relative: 0 %
HEMATOCRIT: 26.6 % — AB (ref 36.0–46.0)
Hemoglobin: 8.8 g/dL — ABNORMAL LOW (ref 12.0–15.0)
LYMPHS ABS: 1.5 10*3/uL (ref 0.7–4.0)
LYMPHS PCT: 10 %
MCH: 29.7 pg (ref 26.0–34.0)
MCHC: 33.1 g/dL (ref 30.0–36.0)
MCV: 89.9 fL (ref 78.0–100.0)
MONO ABS: 0.4 10*3/uL (ref 0.1–1.0)
MONOS PCT: 2 %
NEUTROS ABS: 13.1 10*3/uL — AB (ref 1.7–7.7)
Neutrophils Relative %: 88 %
Platelets: 643 10*3/uL — ABNORMAL HIGH (ref 150–400)
RBC: 2.96 MIL/uL — ABNORMAL LOW (ref 3.87–5.11)
RDW: 14.8 % (ref 11.5–15.5)
WBC: 15 10*3/uL — ABNORMAL HIGH (ref 4.0–10.5)

## 2015-07-21 LAB — COMPREHENSIVE METABOLIC PANEL
ALBUMIN: 2.9 g/dL — AB (ref 3.5–5.0)
ALK PHOS: 77 U/L (ref 38–126)
ALT: 17 U/L (ref 14–54)
ANION GAP: 8 (ref 5–15)
AST: 17 U/L (ref 15–41)
BILIRUBIN TOTAL: 0.4 mg/dL (ref 0.3–1.2)
BUN: 10 mg/dL (ref 6–20)
CO2: 25 mmol/L (ref 22–32)
Calcium: 8.1 mg/dL — ABNORMAL LOW (ref 8.9–10.3)
Chloride: 103 mmol/L (ref 101–111)
Creatinine, Ser: 0.78 mg/dL (ref 0.44–1.00)
GFR calc Af Amer: >60 mL/min (ref >60)
GLUCOSE: 129 mg/dL — AB (ref 65–99)
Potassium: 2.8 mmol/L — ABNORMAL LOW (ref 3.5–5.1)
Sodium: 136 mmol/L (ref 135–145)
Total Protein: 6.8 g/dL (ref 6.5–8.1)

## 2015-07-21 LAB — URINALYSIS, ROUTINE W REFLEX MICROSCOPIC
Glucose, UA: NEGATIVE mg/dL
KETONES UR: NEGATIVE mg/dL
NITRITE: NEGATIVE
PH: 7 (ref 5.0–8.0)
PROTEIN: 30 mg/dL — AB
Specific Gravity, Urine: 1.015 (ref 1.005–1.030)

## 2015-07-21 LAB — URINE MICROSCOPIC-ADD ON

## 2015-07-21 LAB — LACTIC ACID, PLASMA: LACTIC ACID, VENOUS: 1.1 mmol/L (ref 0.5–1.9)

## 2015-07-21 MED ORDER — SODIUM CHLORIDE 0.9 % IV BOLUS (SEPSIS)
1000.0000 mL | Freq: Once | INTRAVENOUS | Status: AC
  Administered 2015-07-21: 1000 mL via INTRAVENOUS

## 2015-07-21 MED ORDER — IBUPROFEN 600 MG PO TABS: 600.0000 mg | ORAL_TABLET | Freq: Four times a day (QID) | ORAL | Status: DC | PRN

## 2015-07-21 MED ORDER — KETOROLAC TROMETHAMINE 30 MG/ML IJ SOLN: 60.0000 mg | Freq: Once | INTRAMUSCULAR | Status: DC

## 2015-07-21 MED ORDER — SODIUM CHLORIDE 0.9 % IV BOLUS (SEPSIS)
500.0000 mL | Freq: Once | INTRAVENOUS | Status: AC
  Administered 2015-07-21: 1000 mL via INTRAVENOUS

## 2015-07-21 MED ORDER — PIPERACILLIN-TAZOBACTAM 3.375 G IVPB
3.3750 g | Freq: Three times a day (TID) | INTRAVENOUS | Status: DC
  Filled 2015-07-21 (×2): qty 50

## 2015-07-21 MED ORDER — SODIUM CHLORIDE 0.9 % IV BOLUS (SEPSIS)
1000.0000 mL | Freq: Once | INTRAVENOUS | Status: AC
Start: 2015-07-21 — End: 2015-07-21
  Administered 2015-07-21: 1000 mL via INTRAVENOUS

## 2015-07-21 MED ORDER — ONDANSETRON HCL 4 MG/2ML IJ SOLN
4.0000 mg | Freq: Once | INTRAMUSCULAR | Status: AC
  Administered 2015-07-21: 4 mg via INTRAVENOUS
  Filled 2015-07-21: qty 2

## 2015-07-21 MED ORDER — AMOXICILLIN-POT CLAVULANATE 875-125 MG PO TABS: 1.0000 | ORAL_TABLET | Freq: Two times a day (BID) | ORAL | Status: AC

## 2015-07-21 MED ORDER — POTASSIUM CHLORIDE CRYS ER 20 MEQ PO TBCR
40.0000 meq | EXTENDED_RELEASE_TABLET | Freq: Once | ORAL | Status: AC
  Administered 2015-07-21: 40 meq via ORAL
  Filled 2015-07-21: qty 2

## 2015-07-21 MED ORDER — IOPAMIDOL (ISOVUE-300) INJECTION 61%
100.0000 mL | Freq: Once | INTRAVENOUS | Status: AC | PRN
  Administered 2015-07-21: 100 mL via INTRAVENOUS

## 2015-07-21 MED ORDER — PIPERACILLIN-TAZOBACTAM 3.375 G IVPB 30 MIN
3.3750 g | Freq: Three times a day (TID) | INTRAVENOUS | Status: AC
  Administered 2015-07-21: 3.375 g via INTRAVENOUS
  Filled 2015-07-21 (×2): qty 50

## 2015-07-21 MED ORDER — DIATRIZOATE MEGLUMINE & SODIUM 66-10 % PO SOLN
30.0000 mL | Freq: Once | ORAL | Status: DC
  Filled 2015-07-21: qty 30

## 2015-07-21 MED ORDER — KETOROLAC TROMETHAMINE 60 MG/2ML IM SOLN
60.0000 mg | Freq: Once | INTRAMUSCULAR | Status: DC
  Administered 2015-07-21: 60 mg via INTRAMUSCULAR
  Filled 2015-07-21: qty 2

## 2015-07-21 NOTE — Progress Notes (Signed)
Patient presented today for a blood pressure check and staple removal. She had emergency c-section 9 days ago. Patient entered the office feeling sick to her stomach and sweating. She stated she had diarrhea all night and was not feeling well. B/P was 83/28 p 92. I reviewed her symptoms with Dr.STINSON who suggested she go to MAU. Patient was transported to MAU to be check out.

## 2015-07-21 NOTE — MAU Note (Signed)
Spoke with Lavella Lemons in Women's unit to give report on Mrs. Mabe.  RN was in pt's room and will return call to MAU.

## 2015-07-21 NOTE — MAU Note (Addendum)
Followed up with lab on lactic acid drawn.  Instructed lab we have time restraints.  Waiting on stat carrier to pick up lactic acid and transfer to [facility] for results.  Unable to advise time frame.  Contact Clarise Cruz at [phone] with info. Dr. Benjamine Mola Mum[place] Tamala Julian, CNM made aware of issue.

## 2015-07-21 NOTE — MAU Note (Signed)
Pt from clinic pale, diaphoretic and very weak.  Primary C/S on 07-12-15.  Discharged on 07-15-15, baby in NICU currently.  Yesterday pt spiked a fever of 100.3.  Last dose tylenol was at 0500.  Pt C/O being very weak, cold and pain to left abd area at incision.  Pt states when she takes the abd dressing off it has some odor with clearish/yellow discharge.

## 2015-07-21 NOTE — Discharge Instructions (Signed)
Abdominal Pain, Adult Many things can cause abdominal pain. Usually, abdominal pain is not caused by a disease and will improve without treatment. It can often be observed and treated at home. Your health care provider will do a physical exam and possibly order blood tests and X-rays to help determine the seriousness of your pain. However, in many cases, more time must pass before a clear cause of the pain can be found. Before that point, your health care provider may not know if you need more testing or further treatment. HOME CARE INSTRUCTIONS Monitor your abdominal pain for any changes. The following actions may help to alleviate any discomfort you are experiencing:  Only take over-the-counter or prescription medicines as directed by your health care provider.  Do not take laxatives unless directed to do so by your health care provider.  Try a clear liquid diet (broth, tea, or water) as directed by your health care provider. Slowly move to a bland diet as tolerated. SEEK MEDICAL CARE IF:  You have unexplained abdominal pain.  You have abdominal pain associated with nausea or diarrhea.  You have pain when you urinate or have a bowel movement.  You experience abdominal pain that wakes you in the night.  You have abdominal pain that is worsened or improved by eating food.  You have abdominal pain that is worsened with eating fatty foods.  You have a fever. SEEK IMMEDIATE MEDICAL CARE IF:  Your pain does not go away within 2 hours.  You keep throwing up (vomiting).  Your pain is felt only in portions of the abdomen, such as the right side or the left lower portion of the abdomen.  You pass bloody or black tarry stools. MAKE SURE YOU:  Understand these instructions.  Will watch your condition.  Will get help right away if you are not doing well or get worse.   This information is not intended to replace advice given to you by your health care provider. Make sure you discuss  any questions you have with your health care provider.   Document Released: 10/03/2004 Document Revised: 09/14/2014 Document Reviewed: 09/02/2012 Elsevier Interactive Patient Education 2016 [place]. Wound Dehiscence Wound dehiscence is when a surgical cut (incision) breaks open and does not heal properly after surgery. It usually happens 7-10 days after surgery. This can be a serious condition. It is important to identify and treat this condition early.  CAUSES  Some common causes of wound dehiscence include:  Stretching of the wound area. This may be caused by lifting, vomiting, violent coughing, or straining during bowel movements.  Wound infection.  Early stitch (suture) removal. RISK FACTORS Various things can increase your risk of developing wound dehiscence, including:  Obesity.  Lung disease.  Smoking.  Poor nutrition.  Contamination during surgery. SIGNS AND SYMPTOMS  Bleeding from the wound.  Pain.  Fever.  Wound starts breaking open. DIAGNOSIS  Your health care provider may diagnose wound dehiscence by monitoring the incision and noting any changes in the wound. These changes can include an increase in drainage or pain. The health care provider may also ask you if you have noticed any stretching or tearing of the wound.  Wound cultures may be taken to determine if there is an infection.  Imaging studies, such as an MRI scan or CT scan, may be done to determine if there is a collection of pus or fluid in the wound area. TREATMENT Treatment may include:  Wound care.  Surgical repair.  Antibiotic medicine  to treat or prevent infection.  Medicines to reduce pain and swelling. HOME CARE INSTRUCTIONS   Only take over-the-counter or prescription medicines for pain, discomfort, or fever as directed by your health care provider. Taking pain medicine 30 minutes before changing a bandage (dressing) can help relieve pain.  Take your antibiotics as  directed. Finish them even if you start to feel better.  Gently wash the area with mild soap and water 2 times a day, or as directed. Rinse off the soap. Pat the area dry with a clean towel. Do not rub the wound. This may cause bleeding.  Follow your health care provider's instructions for how often you need to change the dressing and packing inside. Wash your hands well before and after changing your dressing. Apply a dressing to the wound as directed.  Take showers. Do not soak the wound, bathe, swim, or use a hot tub until directed by your health care provider.  Avoid exercises that make you sweat heavily.  Use anti-itch medicine as directed by your health care provider. The wound may itch when it is healing. Do not pick or scratch at the wound.  Do not lift more than 10 pounds (4.5 kg) until the wound is healed, or as directed by your health care provider.  Keep all follow-up appointments as directed. SEEK MEDICAL CARE IF:  You have excessive bleeding from your surgical wound.  Your wound does not seem to be healing properly.  You have a fever. SEEK IMMEDIATE MEDICAL CARE IF:   You have increased swelling or redness around the wound.  You have increasing pain in the wound.  You have an increasing amount of pus coming from the wound.  Your wound breaks open farther. MAKE SURE YOU:   Understand these instructions.  Will watch your condition.  Will get help right away if you are not doing well or get worse.   This information is not intended to replace advice given to you by your health care provider. Make sure you discuss any questions you have with your health care provider.   Document Released: 03/16/2003 Document Revised: 12/29/2012 Document Reviewed: 08/31/2012 Elsevier Interactive Patient Education 2016 [place] Taking care of your wound properly can help to prevent pain and infection. It can also help your wound to heal more quickly.  HOW TO CARE  FOR YOUR WOUND  Take or apply over-the-counter and prescription medicines only as told by your health care provider.  If you were prescribed antibiotic medicine, take or apply it as told by your health care provider. Do not stop using the antibiotic even if your condition improves.  Clean the wound each day or as told by your health care provider.  Wash the wound with mild soap and water.  Rinse the wound with water to remove all soap.  Pat the wound dry with a clean towel. Do not rub it.  There are many different ways to close and cover a wound. For example, a wound can be covered with stitches (sutures), skin glue, or adhesive strips. Follow instructions from your health care provider about:  How to take care of your wound.  When and how you should change your bandage (dressing).  When you should remove your dressing.  Removing whatever was used to close your wound.  Check your wound every day for signs of infection. Watch for:  Redness, swelling, or pain.  Fluid, blood, or pus.  Keep the dressing dry until your health care provider says  it can be removed. Do not take baths, swim, use a hot tub, or do anything that would put your wound underwater until your health care provider approves.  Raise (elevate) the injured area above the level of your heart while you are sitting or lying down.  Do not scratch or pick at the wound.  Keep all follow-up visits as told by your health care provider. This is important. SEEK MEDICAL CARE IF:  You received a tetanus shot and you have swelling, severe pain, redness, or bleeding at the injection site.  You have a fever.  Your pain is not controlled with medicine.  You have increased redness, swelling, or pain at the site of your wound.  You have fluid, blood, or pus coming from your wound.  You notice a bad smell coming from your wound or your dressing. SEEK IMMEDIATE MEDICAL CARE IF:  You have a red streak going away from your  wound.   This information is not intended to replace advice given to you by your health care provider. Make sure you discuss any questions you have with your health care provider.   Document Released: 10/03/2007 Document Revised: 05/10/2014 Document Reviewed: 12/20/2013 Elsevier Interactive Patient Education 2016 Elsevier Inc.  Abdominal Pain, Adult Many things can cause belly (abdominal) pain. Most times, the belly pain is not dangerous. Many cases of belly pain can be watched and treated at home. HOME CARE   Do not take medicines that help you go poop (laxatives) unless told to by your doctor.  Only take medicine as told by your doctor.  Eat or drink as told by your doctor. Your doctor will tell you if you should be on a special diet. GET HELP IF:  You do not know what is causing your belly pain.  You have belly pain while you are sick to your stomach (nauseous) or have runny poop (diarrhea).  You have pain while you pee or poop.  Your belly pain wakes you up at night.  You have belly pain that gets worse or better when you eat.  You have belly pain that gets worse when you eat fatty foods.  You have a fever. GET HELP RIGHT AWAY IF:   The pain does not go away within 2 hours.  You keep throwing up (vomiting).  The pain changes and is only in the right or left part of the belly.  You have bloody or tarry looking poop. MAKE SURE YOU:   Understand these instructions.  Will watch your condition.  Will get help right away if you are not doing well or get worse.   This information is not intended to replace advice given to you by your health care provider. Make sure you discuss any questions you have with your health care provider.   Document Released: 06/12/2007 Document Revised: 01/14/2014 Document Reviewed: 09/02/2012 Elsevier Interactive Patient Education 2016 [place] An incision is when a surgeon cuts into your body. After surgery, the  incision needs to be cared for properly to prevent infection.  HOW TO CARE FOR YOUR INCISION  Take medicines only as directed by your health care provider.  There are many different ways to close and cover an incision, including stitches, skin glue, and adhesive strips. Follow your health care provider's instructions on:  Incision care.  Bandage (dressing) changes and removal.  Incision closure removal.  Do not take baths, swim, or use a hot tub until your health care provider approves. You  may shower as directed by your health care provider.  Resume your normal diet and activities as directed.  Use anti-itch medicine (such as an antihistamine) as directed by your health care provider. The incision may itch while it is healing. Do not pick or scratch at the incision.  Drink enough fluid to keep your urine clear or pale yellow. SEEK MEDICAL CARE IF:   You have drainage, redness, swelling, or pain at your incision site.  You have muscle aches, chills, or a general ill feeling.  You notice a bad smell coming from the incision or dressing.  Your incision edges separate after the sutures, staples, or skin adhesive strips have been removed.  You have persistent nausea or vomiting.  You have a fever.  You are dizzy. SEEK IMMEDIATE MEDICAL CARE IF:   You have a rash.  You faint.  You have difficulty breathing. MAKE SURE YOU:   Understand these instructions.  Will watch your condition.  Will get help right away if you are not doing well or get worse.   This information is not intended to replace advice given to you by your health care provider. Make sure you discuss any questions you have with your health care provider.   Document Released: 07/13/2004 Document Revised: 01/14/2014 Document Reviewed: 02/17/2013 Elsevier Interactive Patient Education 2016 [employer].   Endometritis Endometritis is an irritation, soreness, and swelling (inflammation) of the lining of  the uterus (endometrium).  CAUSES   Bacterial infections.  Sexually transmitted infections (STIs).  Having a miscarriage or childbirth, especially after a long labor or cesarean delivery.  Certain gynecological procedures (such as dilation and curettage, hysteroscopy, or contraceptive insertion). SIGNS AND SYMPTOMS   Fever.  Lower abdominal or pelvic pain.  Abnormal vaginal discharge or bleeding.  Abdominal bloating (distention) or swelling.  General discomfort or ill feeling.  Discomfort with bowel movements. DIAGNOSIS  A physical and pelvic exam are performed. Other tests may include:  Cultures from the cervix.  Blood tests.  Examining a tissue sample of the uterine lining (endometrial biopsy).  Examining discharge under a microscope (wet prep).  Laparoscopy. TREATMENT  Antibiotic medicines are usually given. Other treatments may include:  Fluids through an IV tube inserted in your vein.  Rest. HOME CARE INSTRUCTIONS   Take over-the-counter or prescription medicines for pain, discomfort, or fever as directed by your health care provider.  Take your antibiotics as directed. Finish them even if you start to feel better.  Resume your normal diet and activities as directed or as tolerated.  Do not douche or have sexual intercourse until your health care provider says it is okay.  Do not have sexual intercourse until your partner has been treated if your endometritis is caused by an STI. SEEK IMMEDIATE MEDICAL CARE IF:   You have swelling or increasing pain in the abdomen.  You have a fever.  You have bad smelling vaginal discharge, or you have an increased amount of discharge.  You have abnormal vaginal bleeding.  Your medicine is not helping with the pain.  You experience any problems that may be related to the medicine you are taking.  You have nausea and vomiting, or you cannot keep foods down.  You have pain with bowel movements. MAKE SURE YOU:     Understand these instructions.  Will watch your condition.  Will get help right away if you are not doing well or get worse.   This information is not intended to replace advice given to you  by your health care provider. Make sure you discuss any questions you have with your health care provider.   Document Released: 12/18/2000 Document Revised: 08/26/2012 Document Reviewed: 07/23/2012 Elsevier Interactive Patient Education [employer].
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
## 2015-07-21 NOTE — MAU Note (Signed)
Spoke with Hoyle Sauer in lab ref. CG4 and Lactic Acid still not in progress.  Lab tech states she is not having order crossing over.  RN instructed if blood has not been obtained then we need to have a lab tech to come draw these labs.  Levada Dy, lab tech at bedside at this time to get additional bloodwork.

## 2015-07-21 NOTE — MAU Provider Note (Signed)
History     CSN: [id number]  Arrival date and time: 07/21/15 [id number]   First Provider Initiated Contact with Patient 07/21/15 0840      Chief Complaint  Patient presents with  . Hypotension   HPI Comments: 35 y/o G1P0101 POD #9 for emergency cesarean, here for LLQ pain and generalized not feeling well, sent in by OB for hypotension. Patient states yesterday, she began having subjective fevers, highest of 100.95F, LLQ abdominal pain, and loose stools that transitioned to more watery stools. Patient denies any bloody stools, melena. +Flatulance, able to eat but decreased appetite, no N/V. +diaphoresis. Patient is not taking any of her antihypertensives, except HCTZ for her edema.  Patient is not breastfeeding.   Patient reports the only meds she is currently taking at home are: HCTZ, Buspar, Fluoxetine.  Patient's post partum course has been uncomplicated since discharge from hospital until yesterday. Patient does have history of bowel perforation due to steroid use for her MS in ~2008, resulting in a colostomy bag, and then subsequently had a re-anastamosis with a hernia repair. She has known MS and last steroid use was during her 2nd trimester of her pregnancy, but has not had any recent steroids and is not currently on MS medication.    OB History    Gravida Para Term Preterm AB TAB SAB Ectopic Multiple Living   1 1  1      0 1      Past Medical History  Diagnosis Date  . Headache(784.0)     migraines  . Multiple sclerosis ([place])   . Asthma as a child  . GERD (gastroesophageal reflux disease)   . Urinary urgency   . Anxiety   . Perforated bowel ([place]) 2009  . Migraines   . Depression   . Chronic back pain   . Chronic pain   . Ovarian cyst   . Panic attack   . Pseudoseizures   . Conversion disorder   . JC virus antibody positive   . PTSD (post-traumatic stress disorder)   . HTN (hypertension) 02/27/2015  . Pregnant and not yet delivered     due Aug 15, 2015  . MS (multiple  sclerosis) ([place])     06-2006    Past Surgical History  Procedure Laterality Date  . Extremity cyst excision  1994    right leg  . 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 Colostomy closure  04/2007  . Scar revision  01/21/2011    Procedure: SCAR REVISION;  Surgeon: Hermelinda Dellen;  Location: [facility];  Service: Plastics;  Laterality: N/A;  exploration of scar of abdomen and repair of defect  . Hernia repair    . Abdominal surgery    . Appendectomy    . Cesarean section N/A 07/12/2015    Procedure: CESAREAN SECTION;  Surgeon: Guss Bunde, MD;  Location: [facility];  Service: Obstetrics;  Laterality: N/A;    Family History  Problem Relation Age of Onset  . Diabetes Mother   . Hypertension Mother   . Diabetes Father   . Hypertension Father   . Arthritis Father   . Cancer Maternal Grandmother   . Cancer Maternal Grandfather   . Alcohol abuse Neg Hx   . Anxiety disorder Neg Hx   . Bipolar disorder Neg Hx   . Drug abuse Neg Hx   . Depression Neg Hx     Social History  Substance Use Topics  . Smoking status:  Current Every Day Smoker -- 0.25 packs/day for 10 years    Types: Cigarettes  . Smokeless tobacco: Never Used  . Alcohol Use: No     Comment: Rare    Allergies:  Allergies  Allergen Reactions  . Amitriptyline Hypertension and Other (See Comments)    hypertension  . Baclofen Hives and Shortness Of Breath  . Cymbalta [Duloxetine Hcl] Shortness Of Breath and Rash  . Gabapentin Shortness Of Breath and Rash  . Monosodium Glutamate Anaphylaxis  . Other Shortness Of Breath and Rash    MSG, beans (vomiting)  . Vicodin [Hydrocodone-Acetaminophen] Hives and Nausea And Vomiting    Projectile vomiting  . Alprazolam Other (See Comments)    Lethargy  . Magnesium Salicylate Hives and Itching  . Rizatriptan Nausea And Vomiting and Other (See Comments)    GI upset, Projectile vomiting  . Tizanidine Hives  . Tramadol Other (See Comments)     Unspecified  . Adhesive [Tape] Other (See Comments)    Skin irritation  . Lamotrigine Rash  . Toradol [Ketorolac Tromethamine] Nausea Only    Prescriptions prior to admission  Medication Sig Dispense Refill Last Dose  . albuterol (PROVENTIL HFA;VENTOLIN HFA) 108 (90 Base) MCG/ACT inhaler Inhale 2 puffs into the lungs every 6 (six) hours as needed for wheezing or shortness of breath. 1 Inhaler 0 Taking  . amLODipine (NORVASC) 5 MG tablet Take 1 tablet (5 mg total) by mouth daily. (Patient not taking: Reported on 07/21/2015) 30 tablet 1 Not Taking  . busPIRone (BUSPAR) 5 MG tablet Take 1 tablet (5 mg total) by mouth 2 (two) times daily. (Patient not taking: Reported on 07/21/2015) 60 tablet 1 Not Taking  . cyclobenzaprine (FLEXERIL) 10 MG tablet TAKE 1 TABLET BY MOUTH 3 TIMES DAILY AS NEEDED FOR MUSCLE SPASMS. 30 tablet 0 Taking  . diazepam (VALIUM) 5 MG tablet Take 1 tablet as needed for panic attacks leading to seizures. Do not take more than 5 a month (Patient taking differently: Take 5 mg by mouth every 6 (six) hours as needed. Take 1 tablet as needed for panic attacks leading to seizures. Do not take more than 5 a month) 5 tablet 5 Taking  . diphenhydrAMINE (SOMINEX) 25 MG tablet Take 25 mg by mouth at bedtime as needed for sleep.   Taking  . Elastic Bandages & Supports (ADJUSTABLE WRIST BRACE) MISC 1 each by Does not apply route as directed. 1 right cock-up brace - wrist. 1 each 0 Taking  . EPINEPHrine (EPIPEN) 0.3 mg/0.3 mL SOAJ injection Inject 0.3 mg into the muscle as needed (allergic reaction). Reported on 03/28/2015   Taking  . FLUoxetine (PROZAC) 20 MG tablet Take 1 tablet (20 mg total) by mouth daily. 30 tablet 1 Taking  . hydrochlorothiazide (HYDRODIURIL) 25 MG tablet Take 1 tablet (25 mg total) by mouth daily. (Patient not taking: Reported on 07/21/2015) 30 tablet 1 Not Taking  . labetalol (NORMODYNE) 200 MG tablet Take 2 tablets (400 mg total) by mouth 2 (two) times daily. (Patient not  taking: Reported on 07/21/2015) 120 tablet 1 Not Taking  . Multiple Vitamins-Minerals (MULTIVITAMIN ADULT PO) Take 1 tablet by mouth daily.    Taking  . oxyCODONE-acetaminophen (ROXICET) 5-325 MG tablet Take 1-2 tablets by mouth every 6 (six) hours as needed for severe pain. 45 tablet 0 Taking  . promethazine (PHENERGAN) 25 MG tablet Take 0.5-1 tablets (12.5-25 mg total) by mouth every 6 (six) hours as needed for nausea. 30 tablet 0 Taking  . ranitidine (  ZANTAC) 300 MG capsule Take 300 mg by mouth at bedtime. Reported on 07/21/2015   Not Taking  . simethicone (MYLICON) 80 MG chewable tablet Chew 1 tablet (80 mg total) by mouth 4 (four) times daily as needed for flatulence. (Patient not taking: Reported on 07/21/2015) 30 tablet 0 Not Taking  . SUMAtriptan (IMITREX) 100 MG tablet TAKE 1 TABLET BY MOUTH EVERY 2 HOURS AS NEEDED FOR MIGREAINE MAY REPEAT IN 2 HRS IF HEADACHE PERSIST (Patient not taking: Reported on 07/21/2015) 9 tablet 0 Not Taking    Review of Systems  Constitutional: Positive for fever (subjective, highest 100.3), chills and diaphoresis.  HENT: Negative for congestion, ear pain and sore throat.   Respiratory: Negative for cough, hemoptysis, sputum production, shortness of breath and wheezing.   Cardiovascular: Negative for chest pain, orthopnea and leg swelling.  Gastrointestinal: Positive for abdominal pain and diarrhea. Negative for nausea, vomiting, blood in stool and melena.  Genitourinary: Negative for dysuria, urgency, frequency and hematuria.  Skin: Negative for rash.       Drainage of incision site, vertical abdominal incision  Neurological: Positive for weakness. Negative for headaches.  Psychiatric/Behavioral: Positive for depression. Negative for suicidal ideas.   Physical Exam   Blood pressure 106/67, pulse 85, temperature 97.6 F (36.4 C), temperature source Axillary, resp. rate 20, SpO2 100 %, not currently breastfeeding.  Physical Exam  Constitutional: She is  oriented to person, place, and time. She appears well-developed and well-nourished.  HENT:  Head: Normocephalic and atraumatic.  Eyes: EOM are normal. Pupils are equal, round, and reactive to light. Right eye exhibits no discharge. Left eye exhibits no discharge. No scleral icterus.  Neck: Normal range of motion. Neck supple.  Cardiovascular: Normal rate, regular rhythm, normal heart sounds and intact distal pulses.   No murmur heard. Respiratory: Effort normal and breath sounds normal. No stridor. No respiratory distress. She has no wheezes.  GI: Soft. She exhibits no mass. There is tenderness (LLQ/pelvic > RLQ). There is guarding.  Neurological: She is alert and oriented to person, place, and time. No cranial nerve deficit.  Skin: No rash noted. She is diaphoretic. No erythema. There is pallor.  Vertical abdominal incision with staples in place, granulation tissue, draining clear fluid. No surrounding erythema.   Psychiatric: She has a normal mood and affect. Her behavior is normal. Judgment and thought content normal.   IMAGING CT Abd/Pelvis w/ contrast IMPRESSION: Expected postop changes from recent C-section. No evidence of intrapelvic hematoma or abscess. No evidence of ureteral or bladder injury or hydronephrosis.  3 cm postop fluid collection within anterior abdominal wall subcutaneous tissues at surgical site, just above the umbilicus  MAU Course  Procedures  07/21/2015 10:45 AM  - Informed by RN that LA order is ordered wrong and has to wait for a courier to pick up STAT lab. After reviewing patient's labs that have returned, and vitals, most likely patient is not septic. Awaiting LA and CT Abd/pelvis w/ contrast before cancelling code sepsis.   07/21/2015 11:44 AM  - Lab result for lactic acid is negative for sepsis. After reviewing the chart and re-evaluating the patient, who is currently stable, CODE SEPSIS IS CANCELLED.  07/21/2015 2:00 PM  -  Removed 40 staples, cleaned  with Benzoine swabs, applied steri strips. No sign of dehiscence. Discharge orders placed, discussed with patient.  Assessment and Plan  35 y/o G1P0101 POD #9 emergent cesarean here for LLQ pain and hypotension. #) Postpartum endometritis #) Hypotension in setting of known  hypertension, resolved #) POD#9 for emergency cesarean, vertical incision with low transverse uterine incision #) Multiple Sclerosis #) History of bowel perforation, s/p colostomy with reanastomosis  Plan: - No sign of abscess or hematoma, clinically treating for postpartum endometritis, sending home with antibiotics. - Hypotension resolved with IVF, discussed hydration and PO intake - Staples removed and replaced with steri strips, to follow up in office on Mon 07/24/15 at 1:40PM for post hospital follow up endometritis and wound check, as well as blood pressure check. - Follow up with neurologist for MS care   [facility] 07/21/2015, 8:55 AM

## 2015-07-22 ENCOUNTER — Other Ambulatory Visit (HOSPITAL_COMMUNITY): Payer: Self-pay | Admitting: Psychiatry

## 2015-07-22 LAB — URINE CULTURE

## 2015-07-24 ENCOUNTER — Ambulatory Visit: Payer: Medicare Other | Admitting: General Practice

## 2015-07-24 VITALS — BP 138/84 | HR 95 | Temp 98.6°F | Ht 64.0 in | Wt 236.0 lb

## 2015-07-24 DIAGNOSIS — Z0130 Encounter for examination of blood pressure without abnormal findings: Secondary | ICD-10-CM

## 2015-07-24 NOTE — Progress Notes (Signed)
Patient here today for wound check & BP check. Patient reports changing wound dressing two times per day and is keeping incision clean & dry. Incision appears intact. 2 inch area of granulation tissue present in lower third of incision with scant yellow drainage. No odor or bleeding present. Dr Ihor Dow viewed incision and recommended dressing changes 3 times a day with antibacterial soap cleaning.

## 2015-07-26 LAB — CULTURE, BLOOD (ROUTINE X 2)
CULTURE: NO GROWTH
Culture: NO GROWTH

## 2015-07-27 ENCOUNTER — Encounter (HOSPITAL_COMMUNITY): Payer: Self-pay | Admitting: Psychiatry

## 2015-07-27 ENCOUNTER — Ambulatory Visit (INDEPENDENT_AMBULATORY_CARE_PROVIDER_SITE_OTHER): Payer: Medicare Other | Admitting: Psychiatry

## 2015-07-27 VITALS — BP 135/84 | HR 109 | Ht 64.0 in | Wt 237.0 lb

## 2015-07-27 DIAGNOSIS — F411 Generalized anxiety disorder: Secondary | ICD-10-CM

## 2015-07-27 DIAGNOSIS — F4001 Agoraphobia with panic disorder: Secondary | ICD-10-CM | POA: Diagnosis not present

## 2015-07-27 DIAGNOSIS — F445 Conversion disorder with seizures or convulsions: Secondary | ICD-10-CM

## 2015-07-27 DIAGNOSIS — F332 Major depressive disorder, recurrent severe without psychotic features: Secondary | ICD-10-CM

## 2015-07-27 MED ORDER — FLUOXETINE HCL 20 MG PO TABS: 20.0000 mg | ORAL_TABLET | Freq: Every day | ORAL | Status: DC

## 2015-07-27 MED ORDER — BUSPIRONE HCL 5 MG PO TABS: 5.0000 mg | ORAL_TABLET | Freq: Three times a day (TID) | ORAL | Status: DC

## 2015-07-27 MED ORDER — DIAZEPAM 5 MG PO TABS: ORAL_TABLET | ORAL | Status: DC
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
## 2015-07-28 ENCOUNTER — Telehealth: Payer: Self-pay

## 2015-07-28 NOTE — Telephone Encounter (Signed)
Records reviewed, MRI brain has been ordered for her last week, let's proceed with MRI brain with and without contrast first. Is she breastfeeding? If not, we can restart her MS medication. Thanks

## 2015-07-28 NOTE — Telephone Encounter (Signed)
Left message for pt to return call.

## 2015-07-28 NOTE — Telephone Encounter (Signed)
Pt gave birth on 07/12/15. On 07/15/15, pt started experiencing MS Flair symptoms. They are continuing to worsen. Pt is having pen/needle sensation/pain in both feet that radiates all the way up both legs to thighs. Recently, within last few days, pt has began to experiencing tingling/numbness in hands that radiates up to elbows. Please advise.   [id number]

## 2015-07-30 ENCOUNTER — Emergency Department (HOSPITAL_COMMUNITY): Payer: Medicare Other

## 2015-07-30 ENCOUNTER — Emergency Department (HOSPITAL_COMMUNITY)
Admission: EM | Admit: 2015-07-30 | Discharge: 2015-07-30 | Disposition: A | Discharge status: Home / Self Care | Payer: Medicare Other | Attending: Physician Assistant | Admitting: Physician Assistant

## 2015-07-30 ENCOUNTER — Encounter (HOSPITAL_COMMUNITY): Payer: Self-pay

## 2015-07-30 DIAGNOSIS — Z79899 Other long term (current) drug therapy: Secondary | ICD-10-CM | POA: Diagnosis not present

## 2015-07-30 DIAGNOSIS — J45909 Unspecified asthma, uncomplicated: Secondary | ICD-10-CM | POA: Diagnosis not present

## 2015-07-30 DIAGNOSIS — R531 Weakness: Secondary | ICD-10-CM | POA: Diagnosis not present

## 2015-07-30 DIAGNOSIS — M4802 Spinal stenosis, cervical region: Secondary | ICD-10-CM | POA: Diagnosis not present

## 2015-07-30 DIAGNOSIS — F17210 Nicotine dependence, cigarettes, uncomplicated: Secondary | ICD-10-CM | POA: Insufficient documentation

## 2015-07-30 DIAGNOSIS — M6281 Muscle weakness (generalized): Secondary | ICD-10-CM | POA: Diagnosis not present

## 2015-07-30 DIAGNOSIS — I10 Essential (primary) hypertension: Secondary | ICD-10-CM | POA: Diagnosis not present

## 2015-07-30 DIAGNOSIS — R569 Unspecified convulsions: Secondary | ICD-10-CM | POA: Diagnosis not present

## 2015-07-30 DIAGNOSIS — G35 Multiple sclerosis: Secondary | ICD-10-CM

## 2015-07-30 HISTORY — DX: History of uterine scar from previous surgery: Z98.891

## 2015-07-30 LAB — CBC WITH DIFFERENTIAL/PLATELET
Basophils Absolute: 0 10*3/uL (ref 0.0–0.1)
Basophils Relative: 0 %
EOS PCT: 1 %
Eosinophils Absolute: 0.1 10*3/uL (ref 0.0–0.7)
HCT: 30.8 % — ABNORMAL LOW (ref 36.0–46.0)
Hemoglobin: 9.6 g/dL — ABNORMAL LOW (ref 12.0–15.0)
LYMPHS ABS: 2.5 10*3/uL (ref 0.7–4.0)
LYMPHS PCT: 34 %
MCH: 28.3 pg (ref 26.0–34.0)
MCHC: 31.2 g/dL (ref 30.0–36.0)
MCV: 90.9 fL (ref 78.0–100.0)
MONOS PCT: 5 %
Monocytes Absolute: 0.4 10*3/uL (ref 0.1–1.0)
Neutro Abs: 4.4 10*3/uL (ref 1.7–7.7)
Neutrophils Relative %: 60 %
PLATELETS: 782 10*3/uL — AB (ref 150–400)
RBC: 3.39 MIL/uL — AB (ref 3.87–5.11)
RDW: 14.5 % (ref 11.5–15.5)
WBC: 7.3 10*3/uL (ref 4.0–10.5)

## 2015-07-30 LAB — COMPREHENSIVE METABOLIC PANEL
ALK PHOS: 75 U/L (ref 38–126)
ALT: 13 U/L — AB (ref 14–54)
AST: 15 U/L (ref 15–41)
Albumin: 3.1 g/dL — ABNORMAL LOW (ref 3.5–5.0)
Anion gap: 4 — ABNORMAL LOW (ref 5–15)
BUN: 6 mg/dL (ref 6–20)
CALCIUM: 8.4 mg/dL — AB (ref 8.9–10.3)
CHLORIDE: 110 mmol/L (ref 101–111)
CO2: 26 mmol/L (ref 22–32)
CREATININE: 0.76 mg/dL (ref 0.44–1.00)
Glucose, Bld: 84 mg/dL (ref 65–99)
Potassium: 3.9 mmol/L (ref 3.5–5.1)
Sodium: 140 mmol/L (ref 135–145)
Total Bilirubin: 0.4 mg/dL (ref 0.3–1.2)
Total Protein: 6.5 g/dL (ref 6.5–8.1)

## 2015-07-30 MED ORDER — LORAZEPAM 2 MG/ML IJ SOLN
1.0000 mg | Freq: Once | INTRAMUSCULAR | Status: AC
Start: 2015-07-30 — End: 2015-07-30
  Administered 2015-07-30: 1 mg via INTRAVENOUS
  Filled 2015-07-30: qty 1

## 2015-07-30 NOTE — ED Provider Notes (Signed)
[facility] DEPT Provider Note   CSN: [id number] Arrival date & time: 07/30/15  1136  First Provider Contact:  First MD Initiated Contact with Patient 07/30/15 1330        History   Chief Complaint Chief Complaint  Patient presents with  . Extremity Weakness    HPI Emily [name] is a 35 y.o. female.  Emily [name] is a 35 y.o. female with history of MS, chronic back pain, conversion disorder, pseudoseizures, migraines, hypertension, presents to emergency department complaining of pain to the soles of the feet, and ascending numbness that initiates in feet and hands fingers up the extremities into the lower back. Pt states she is post partum 2 wks ago. States she has been off her MS medications during pregnancy. States she tried to call her neurologist, but was unable to get in touch with them. She is having difficulty ambulating.        Past Medical History:  Diagnosis Date  . Anxiety   . Asthma as a child  . Chronic back pain   . Chronic pain   . Conversion disorder   . Depression   . GERD (gastroesophageal reflux disease)   . Headache(784.0)    migraines  . HTN (hypertension) 02/27/2015  . JC virus antibody positive   . Migraines   . MS (multiple sclerosis) ([place])    06-2006  . Multiple sclerosis ([place])   . Ovarian cyst   . Panic attack   . Perforated bowel ([place]) 2009  . Pseudoseizures   . PTSD (post-traumatic stress disorder)   . S/P emergency C-section   . Urinary urgency     Patient Active Problem List   Diagnosis Date Noted  . Pain, postoperative, acute 07/21/2015  . Postpartum endometritis 07/21/2015  . Fetal bradycardia 07/12/2015  . PTSD (post-traumatic stress disorder) 05/16/2015  . Conversion disorder with seizures or convulsions 05/16/2015  . Severe episode of recurrent major depressive disorder, without psychotic features ([place]) 05/16/2015  . GAD (generalized anxiety disorder) 05/16/2015  . Multiple sclerosis affecting pregnancy,  antepartum ([place]) 05/10/2015  . Poor fetal growth, affecting management of mother, antepartum condition or complication [id number]  . Difficult intravenous access 05/08/2015  . Right optic neuritis 04/27/2015  . Migraine with aura and without status migrainosus, not intractable 03/28/2015  . Generalized anxiety disorder 03/28/2015  . Substance abuse affecting pregnancy, antepartum 03/08/2015  . Obesity complicating pregnancy, childbirth, or puerperium, antepartum 02/27/2015  . Tobacco smoking affecting pregnancy, antepartum 02/27/2015  . AMA (advanced maternal age) primigravida 35+ 02/27/2015  . HTN (hypertension) 02/27/2015  . Preexisting hypertension complicating pregnancy, antepartum 02/27/2015  . Perforated bowel ([place]) 02/27/2015  . Supervision of high-risk pregnancy 12/27/2014  . Depression 12/27/2014  . Multiple sclerosis ([place]) 12/27/2014  . Migraine 12/27/2014  . Seizure disorder ([place]) 12/27/2014  . Smoker 12/27/2014  . Obesity 12/27/2014    Past Surgical History:  Procedure Laterality Date  . ABDOMINAL SURGERY    . APPENDECTOMY    . BOWEL RESECTION  01/2007   with colostomy  . CESAREAN SECTION N/A 07/12/2015   Procedure: CESAREAN SECTION;  Surgeon: Guss Bunde, MD;  Location: [facility];  Service: Obstetrics;  Laterality: N/A;  . COLOSTOMY CLOSURE  04/2007  . EXTREMITY CYST EXCISION  1994   right leg  . HERNIA REPAIR    . SCAR REVISION  01/21/2011   Procedure: SCAR REVISION;  Surgeon: Hermelinda Dellen;  Location: [facility];  Service: Plastics;  Laterality: N/A;  exploration  of scar of abdomen and repair of defect    OB History    Gravida Para Term Preterm AB Living   1 1   1   1    SAB TAB Ectopic Multiple Live Births         0         Home Medications    Prior to Admission medications   Medication Sig Start Date End Date Taking? Authorizing Provider  albuterol (PROVENTIL HFA;VENTOLIN HFA) 108 (90 Base) MCG/ACT inhaler Inhale 2 puffs  into the lungs every 6 (six) hours as needed for wheezing or shortness of breath. 02/15/15   Paticia Stack, PA-C  amoxicillin-clavulanate (AUGMENTIN) 875-125 MG tablet Take 1 tablet by mouth every 12 (twelve) hours. Take for 14 days, take until finished. 07/21/15 08/04/15  Lauralyn Primes Mumaw, DO  busPIRone (BUSPAR) 5 MG tablet Take 1 tablet (5 mg total) by mouth 3 (three) times daily. 07/27/15   Charlcie Cradle, MD  cephALEXin (KEFLEX) 500 MG capsule Take 500 mg by mouth 2 (two) times daily. 07/08/15   Historical Provider, MD  cyclobenzaprine (FLEXERIL) 10 MG tablet TAKE 1 TABLET BY MOUTH 3 TIMES DAILY AS NEEDED FOR MUSCLE SPASMS. 06/18/15   Peggy Constant, MD  diazepam (VALIUM) 5 MG tablet Take 1 tablet as needed for panic attacks leading to seizures. 07/27/15   Charlcie Cradle, MD  diphenhydrAMINE (SOMINEX) 25 MG tablet Take 25 mg by mouth at bedtime as needed for sleep.    Historical Provider, MD  EPINEPHrine (EPIPEN) 0.3 mg/0.3 mL SOAJ injection Inject 0.3 mg into the muscle as needed (allergic reaction). Reported on 03/28/2015    Historical Provider, MD  FLUoxetine (PROZAC) 20 MG tablet Take 1 tablet (20 mg total) by mouth daily. 07/27/15   Charlcie Cradle, MD  hydrochlorothiazide (HYDRODIURIL) 25 MG tablet Take 1 tablet (25 mg total) by mouth daily. 07/15/15   Aletha Halim, MD  ibuprofen (ADVIL,MOTRIN) 600 MG tablet Take 1 tablet (600 mg total) by mouth every 6 (six) hours as needed for moderate pain. Do not take on an empty stomach. 07/21/15   Lauralyn Primes Mumaw, DO  labetalol (NORMODYNE) 200 MG tablet Take 2 tablets (400 mg total) by mouth 2 (two) times daily. Patient taking differently: Take 400 mg by mouth 2 (two) times daily as needed (for high blood pressure).  07/06/15   Luvenia Redden, PA-C  Multiple Vitamins-Minerals (MULTIVITAMIN ADULT PO) Take 1 tablet by mouth daily.     Historical Provider, MD  oxyCODONE-acetaminophen (ROXICET) 5-325 MG tablet Take 1-2 tablets by mouth every 6  (six) hours as needed for severe pain. 07/15/15   Aletha Halim, MD  promethazine (PHENERGAN) 25 MG tablet Take 0.5-1 tablets (12.5-25 mg total) by mouth every 6 (six) hours as needed for nausea. 07/10/15   Gwynne Edinger, MD  ranitidine (ZANTAC) 300 MG capsule Take 300 mg by mouth at bedtime. Reported on 07/21/2015    Historical Provider, MD  simethicone (MYLICON) 80 MG chewable tablet Chew 1 tablet (80 mg total) by mouth 4 (four) times daily as needed for flatulence. 07/15/15   Aletha Halim, MD  SUMAtriptan (IMITREX) 100 MG tablet TAKE 1 TABLET BY MOUTH EVERY 2 HOURS AS NEEDED FOR MIGREAINE MAY REPEAT IN 2 HRS IF HEADACHE PERSIST 05/18/15   Mora Bellman, MD    Family History Family History  Problem Relation Age of Onset  . Diabetes Mother   . Hypertension Mother   . Diabetes Father   . Hypertension Father   .  Arthritis Father   . Cancer Maternal Grandmother   . Cancer Maternal Grandfather   . Alcohol abuse Neg Hx   . Anxiety disorder Neg Hx   . Bipolar disorder Neg Hx   . Drug abuse Neg Hx   . Depression Neg Hx     Social History Social History  Substance Use Topics  . Smoking status: Current Every Day Smoker    Packs/day: 0.25    Years: 10.00    Types: Cigarettes  . Smokeless tobacco: Never Used  . Alcohol use No     Comment: Rare     Allergies   Amitriptyline; Baclofen; Cymbalta [duloxetine hcl]; Gabapentin; Monosodium glutamate; Other; Vicodin [hydrocodone-acetaminophen]; Alprazolam; Magnesium salicylate; Rizatriptan; Tizanidine; Tramadol; Adhesive [tape]; Lamotrigine; and Toradol [ketorolac tromethamine]   Review of Systems Review of Systems  Constitutional: Negative for chills and fever.  Respiratory: Negative for cough, chest tightness and shortness of breath.   Cardiovascular: Negative for chest pain, palpitations and leg swelling.  Gastrointestinal: Negative for abdominal pain, diarrhea, nausea and vomiting.  Genitourinary: Negative for dysuria, flank pain,  pelvic pain, vaginal bleeding, vaginal discharge and vaginal pain.  Musculoskeletal: Negative for arthralgias, myalgias, neck pain and neck stiffness.  Skin: Negative for rash.  Neurological: Positive for weakness and numbness. Negative for dizziness and headaches.  All other systems reviewed and are negative.    Physical Exam Updated Vital Signs BP (!) 157/116 (BP Location: Left Arm)   Pulse 92   Temp 98.7 F (37.1 C) (Oral)   Resp 16   Ht 5\' 4"  (1.626 m)   Wt 107.5 kg   SpO2 100%   BMI 40.68 kg/m   Physical Exam  Constitutional: She appears well-developed and well-nourished. No distress.  HENT:  Head: Normocephalic.  Eyes: Conjunctivae are normal.  Neck: Neck supple.  Cardiovascular: Normal rate, regular rhythm and normal heart sounds.   Pulmonary/Chest: Effort normal and breath sounds normal. No respiratory distress. She has no wheezes. She has no rales.  Abdominal: Soft. Bowel sounds are normal. She exhibits no distension. There is no tenderness. There is no rebound.  Musculoskeletal: She exhibits no edema.  Neurological: She is alert.  Decreased strength of bilateral grips, equal bilaterally. Equal strength of bilateral lower extremities, 5 out of 5. Normal finger to nose. Normal heel to shin. Sensation decreased bilaterally and diffusely to upper and lower extremities.   Skin: Skin is warm and dry.  Psychiatric: She has a normal mood and affect. Her behavior is normal.  Nursing note and vitals reviewed.    ED Treatments / Results  Labs (all labs ordered are listed, but only abnormal results are displayed) Labs Reviewed  CBC WITH DIFFERENTIAL/PLATELET  COMPREHENSIVE METABOLIC PANEL    EKG  EKG Interpretation None       Radiology No results found.  Procedures Procedures (including critical care time)  Medications Ordered in ED Medications - No data to display   Initial Impression / Assessment and Plan / ED Course  I have reviewed the triage vital  signs and the nursing notes.  Pertinent labs & imaging results that were available during my care of the patient were reviewed by me and considered in my medical decision making (see chart for details).  Clinical Course  Comment By Time  Spoke with Dr. Sherlynn Carbon neurology, plan to get MRI of the brain and cervical spine with and without contrast, if positive for acute MS, administer dose of steroids and close follow-up palpation. Jeannett Senior, PA-C 07/23 1538  Discussed with Dr. Steffanie Dunn at shift change. Will follow up on MRI and proceed with above plan.   Final Clinical Impressions(s) / ED Diagnoses   Final diagnoses:  Weakness  General weakness    New Prescriptions New Prescriptions   No medications on file     Jeannett Senior, PA-C 08/02/15 2101    Courteney Lyn Mackuen, MD 08/03/15 2326

## 2015-07-30 NOTE — ED Notes (Signed)
MRI staff reports pt had an episode of "shaking and rapid breathing" when trying to finish MRI. Pt reports anxiety from MRI and also states "I have seizures when I am stressed." Md Zavitz informed.

## 2015-07-30 NOTE — ED Notes (Signed)
Unable to obtain IV access X3

## 2015-07-30 NOTE — ED Provider Notes (Signed)
Patient presented with general weakness. Patient's care signed out to me to follow-up MRI with contrast results. Discussed the case with neurologist on-call, no new lesions, no indication for high-dose steroids at this time. Patient is to follow-up in the neurology clinic this week and can resume her medications that were stopped for pregnancy. MRI results reviewed, no acute findings. Elnora Morrison Mariea Clonts, MD 07/30/15 [phone]

## 2015-07-30 NOTE — Discharge Instructions (Signed)
Your neurologist is expecting you for outpatient follow-up. You can resume your MS medications. If you were given medicines take as directed.  If you are on coumadin or contraceptives realize their levels and effectiveness is altered by many different medicines.  If you have any reaction (rash, tongues swelling, other) to the medicines stop taking and see a physician.    If your blood pressure was elevated in the ER make sure you follow up for management with a primary doctor or return for chest pain, shortness of breath or stroke symptoms.  Please follow up as directed and return to the ER or see a physician for new or worsening symptoms.  Thank you. Vitals:   07/30/15 1155 07/30/15 1500  BP: (!) 157/116 (!) 158/112  Pulse: 92 82  Resp: 16   Temp: 98.7 F (37.1 C)   TempSrc: Oral   SpO2: 100% 100%  Weight: 237 lb (107.5 kg)   Height: 5\' 4"  (1.626 m)

## 2015-07-30 NOTE — ED Triage Notes (Signed)
Per PT, Pt had baby on 07/12/2014 with C-Section. Pt has HX of MS. Pt reports bilateral leg weakness and burning that radiates to the back up to mid-back. Pt has been trying to get into Neurologist, but she hasn't been able to be seen yet. Pt reports the MS flaring up throughout the last two weeks. Pt complains of optic neuritis in her right eye. Pt has Hx of same with blurred vision at baseline.

## 2015-07-30 NOTE — Significant Event (Signed)
Rapid Response Event Note  Overview:  RRT page stat to MRI Time Called: 1815 Arrival Time: [id number] Event Type: Other (Comment)  Initial Focused Assessment:  STAT page to MRI, RT present with me on arrival to MRI.  Patient was on table outside of MRI on room air.  MRI tech states she had a 3 minute full body seizure with her eyes rolled back in head.  On my arrival, patient was awake, oriented and crying.  Patient states she is under a lot of stress due to her new baby and wants to make her self better for when the baby comes home from the hospital.  She states she is worried that she will not be able to take care of her new baby because since birth she has been having to use her walker.  Patient also states she has conversion disorder and this is what happened in MRI.  Attempted to console patient and she calmed down, still tearful   Interventions:  No RRT interventions.  Assisted with getting patient back on the strecther and brought back to the ED.  Plan of Care (if not transferred):  Event Summary:  Patient transported back to ED and updated CN on events.     at      at          [facility], [place]lin Rain

## 2015-07-31 ENCOUNTER — Ambulatory Visit: Payer: Self-pay | Admitting: Obstetrics and Gynecology

## 2015-08-02 ENCOUNTER — Encounter: Payer: Self-pay | Admitting: Neurology

## 2015-08-02 ENCOUNTER — Ambulatory Visit (INDEPENDENT_AMBULATORY_CARE_PROVIDER_SITE_OTHER): Payer: Medicare Other | Admitting: Neurology

## 2015-08-02 VITALS — BP 120/76 | HR 98 | Ht 64.0 in | Wt 233.0 lb

## 2015-08-02 DIAGNOSIS — F411 Generalized anxiety disorder: Secondary | ICD-10-CM

## 2015-08-02 DIAGNOSIS — F445 Conversion disorder with seizures or convulsions: Secondary | ICD-10-CM

## 2015-08-02 DIAGNOSIS — G894 Chronic pain syndrome: Secondary | ICD-10-CM | POA: Insufficient documentation

## 2015-08-02 DIAGNOSIS — R202 Paresthesia of skin: Secondary | ICD-10-CM | POA: Diagnosis not present

## 2015-08-02 DIAGNOSIS — G35 Multiple sclerosis: Secondary | ICD-10-CM | POA: Diagnosis not present

## 2015-08-02 NOTE — Progress Notes (Signed)
NEUROLOGY FOLLOW UP OFFICE NOTE  Emily [name] [id number]  HISTORY OF PRESENT ILLNESS: I had the pleasure of seeing Emily [name] in follow-up in the neurology clinic on 08/02/2015.  The patient was last seen 3 months ago in the Neurology clinic. She was seen for conversion disorder with shaking spells. She is aware these episodes occur with panic attacks, she usually takes diazepam and symptoms would abate, however reports a panic attack while having her MRI this week, when she came to people told her she had a seizure. She has a history of MS and had to stop Gilenya with pregnancy. She also has migraines and had been doing well with discontinuation of Depakote and Topamax during pregnancy. She had been seeing neurologist Dr. Macario Carls at [facility] and presents today 4 weeks post-partum with recurrence of symptoms she was having prior to her pregnancy. She delivered early at [redacted] weeks gestation last July 5. A week after delivery, she started having numbness on the top of both feet, pain in the balls of her feet, with numbness traveling up to her mid-back. She also has numbness in both forearms, and reports her palms feel the same as her soles, with constant stabbing burning pain. She had these symptoms pre-pregnancy but states the numbness only went up to her knees. During pregnancy, all her symptoms were less bothersome. She has been following with Pain management in [facility] and would occasionally take Oxycodone prior to her pregnancy. She comes in a wheelchair today, reporting that her legs are like rubber. She can walk short distances, then the pain comes and her knees lock. She reports numbness up to the perineum, and some urinary urgency. No bowel incontinence. She only takes Labetalol as needed, and has taken it twice this week. Due to these symptoms, she went to the ER where she had an MRI brain and C-spine with and without contrast which I personally reviewed. There was no abnormal  enhancement. There were no changes from previous MRI brain in 09/2013 with multiple bilateral subcortical greater than 10 periventricular T2 hyperintensities. There were no cord lesions seen up to T3-4. There was chronic degenerative disc disease and central canal stenosis at C5-6 and C6-7, no abnormal cord signal, unchanged from 06/2014.  HPI 03/28/15: This is a 35 yo RH woman with a history of relapsing-remitting multiple sclerosis, headaches, anxiety with panic attacks, conversion disorder with seizures. She presented at [redacted] weeks pregnant to determine if diazepam for anxiety/seizures would be a medication she can use, understanding this is pregnancy category D versus risks of injuring herself from falls when she has the psychogenic seizures. Records from her previous neurologists at [facility] were reviewed. She was diagnosed with MS in June 2008 while living in [place] state, she started having right thigh pain and right eye blurred vision. She was diagnosed with right optic neuritis and underwent MRI brain and lumbar puncture which confirmed a diagnosis of MS. 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 She has been on Gilenya since 2014 without any relapse. This was discontinued in December 2016 when she found out she was pregnant. She has a history of chronic pain and migraines, and was taking Depakote and Topamax for headache prophylaxis, also stopped in December 2016. She reports having Botox for her migraines, but so far migraines are not as bad as they were.  She started having  shaking episodes in April 2016. Initially Depakote dose was increased to 500mg  BID. She was back in May 2016 with multiple episodes of syncope, palpitations, a sensation of her heart jumping out of her chest. She also reported lightheadedness, facial numbness, and numbness in her  hands and feet. On her last visit in June 2016 at [facility], shaking episodes were discussed. Events always occur after a stressful event. She had a spell in [place] where she had a normal EEG and was diagnosed with a conversion disorder. She had seen Dr. Darleene Cleaver and reportedly told her he could not help her because "her case was too complex." She started seeing a psychiatrist in [place], however since she switched neurologists, she reports that he cannot see her anymore. She has not been able to establish care with [facility] and continues to have anxiety and panic attacks "all the time." She is diaphoretic in the office today and reports she always feels anxious. When she starts having one of her seizures, she would have significant anxiety, "my body can't deal and goes into a seizure," she hears her heartbeat in her ears. If it slowly builds up, she can get herself to a safe place, however other times it "just hits me," and she falls to the ground and has injured herself. She reports falling out one time in the bathroom during a doctor visit. She is currently on Fluoxetine and Buspirone. Clonazepam in the past did not help. She would usually go to the ER for these spells, which would quiet down once she gets Benadryl and Diazepam. She reports having 3 episodes in the past 2 months.   Diagnostic Data:  I personally reviewed MRI brain with and without contrast done with [facility] last 10/02/13 which did not show any acute changes, there was multiple T2/FLAIR lesions in the subcortical and deep white matter regions, no abnormal enhancement. Hippocampi appear asymmetric, smaller on the right without abnormal signal or enhancement.  MRI C-spine with and without contrast 10/18/13 showed hazy T2 hyperintensity within the cord from C2-3 to C4-5 without abnormal enhancement.  per [facility] - MRI brain done 03/18/14: "No change in numerous multifocal T2 hyperintense white matter lesions relative to prior MRI from  04/17/2013 most consistent with demyelinating lesions given history of multiple sclerosis. No evidence of active demyelination."  MRI cervical spine done 03/18/14: "Relative to prior MRI from 10/20/2009, no change in patchy foci of increased T2 signal in the dorsal cord at C2- C4 in keeping with demyelinating lesions in this patient with a clinical history of multiple sclerosis.  No evidence of enhancement to suggest active demyelination. Moderately advanced degenerative disease at C5-C6 and C6-C7 with interval development of left eccentric central, foraminal entry zone and foraminal disc extrusion at C6-C7 exerting mass effect on the exiting C7 and descending C8 roots.  PAST MEDICAL HISTORY: Past Medical History:  Diagnosis Date  . Anxiety   . Asthma as a child  . Chronic back pain   . Chronic pain   . Conversion disorder   . Depression   . GERD (gastroesophageal reflux disease)   . Headache(784.0)    migraines  . HTN (hypertension) 02/27/2015  . JC virus antibody positive   . Migraines   . MS (multiple sclerosis) ([place])    06-2006  . Multiple sclerosis ([place])   . Ovarian cyst   . Panic attack   . Perforated bowel ([place]) 2009  . Pseudoseizures   . PTSD (post-traumatic stress disorder)   . S/P emergency C-section   .  Urinary urgency     MEDICATIONS: Current Outpatient Prescriptions on File Prior to Visit  Medication Sig Dispense Refill  . albuterol (PROVENTIL HFA;VENTOLIN HFA) 108 (90 Base) MCG/ACT inhaler Inhale 2 puffs into the lungs every 6 (six) hours as needed for wheezing or shortness of breath. 1 Inhaler 0  . amoxicillin-clavulanate (AUGMENTIN) 875-125 MG tablet Take 1 tablet by mouth every 12 (twelve) hours. Take for 14 days, take until finished. 28 tablet 0  . busPIRone (BUSPAR) 5 MG tablet Take 1 tablet (5 mg total) by mouth 3 (three) times daily. 90 tablet 2  . cyclobenzaprine (FLEXERIL) 10 MG tablet TAKE 1 TABLET BY MOUTH 3 TIMES DAILY AS NEEDED FOR MUSCLE SPASMS. 30  tablet 0  . diazepam (VALIUM) 5 MG tablet Take 1 tablet as needed for panic attacks leading to seizures. (Patient taking differently: Take 5 mg by mouth daily. Take 1 tablet as needed for panic attacks leading to seizures.) 30 tablet 1  . diphenhydrAMINE (SOMINEX) 25 MG tablet Take 25 mg by mouth at bedtime as needed for sleep.    Marland Kitchen EPINEPHrine (EPIPEN) 0.3 mg/0.3 mL SOAJ injection Inject 0.3 mg into the muscle as needed (allergic reaction). Reported on 03/28/2015    . FLUoxetine (PROZAC) 20 MG tablet Take 1 tablet (20 mg total) by mouth daily. 30 tablet 2  . hydrochlorothiazide (HYDRODIURIL) 25 MG tablet Take 1 tablet (25 mg total) by mouth daily. 30 tablet 1  . ibuprofen (ADVIL,MOTRIN) 600 MG tablet Take 1 tablet (600 mg total) by mouth every 6 (six) hours as needed for moderate pain. Do not take on an empty stomach. 60 tablet 0  . labetalol (NORMODYNE) 200 MG tablet Take 2 tablets (400 mg total) by mouth 2 (two) times daily. (Patient taking differently: Take by mouth. Patient only to take if Blood Pressure is high) 120 tablet 1  . Multiple Vitamins-Minerals (MULTIVITAMIN ADULT PO) Take 1 tablet by mouth daily.     . promethazine (PHENERGAN) 25 MG tablet Take 0.5-1 tablets (12.5-25 mg total) by mouth every 6 (six) hours as needed for nausea. 30 tablet 0  . ranitidine (ZANTAC) 300 MG capsule Take 300 mg by mouth at bedtime. Reported on 07/21/2015    . simethicone (MYLICON) 80 MG chewable tablet Chew 1 tablet (80 mg total) by mouth 4 (four) times daily as needed for flatulence. 30 tablet 0  . SUMAtriptan (IMITREX) 100 MG tablet TAKE 1 TABLET BY MOUTH EVERY 2 HOURS AS NEEDED FOR MIGREAINE MAY REPEAT IN 2 HRS IF HEADACHE PERSIST 9 tablet 0   Current Facility-Administered Medications on File Prior to Visit  Medication Dose Route Frequency Provider Last Rate Last Dose  . dexamethasone (DECADRON) injection 10 mg  10 mg Intravenous Once Milton Ferguson, MD      . metoCLOPramide (REGLAN) injection 10 mg  10 mg  Intravenous Once Milton Ferguson, MD        ALLERGIES: Allergies  Allergen Reactions  . Amitriptyline Hypertension and Other (See Comments)    hypertension  . Baclofen Hives and Shortness Of Breath  . Cymbalta [Duloxetine Hcl] Shortness Of Breath and Rash  . Gabapentin Shortness Of Breath and Rash  . Monosodium Glutamate Anaphylaxis  . Other Shortness Of Breath and Rash    MSG, beans (vomiting)  . Vicodin [Hydrocodone-Acetaminophen] Hives and Nausea And Vomiting    Projectile vomiting  . Alprazolam Other (See Comments)    Lethargy  . Magnesium Salicylate Hives and Itching  . Rizatriptan Nausea And Vomiting and Other (See  Comments)    GI upset, Projectile vomiting  . Tizanidine Hives  . Tramadol Other (See Comments)    Unspecified  . Adhesive [Tape] Other (See Comments)    Skin irritation Paper tape is ok  . Lamotrigine Rash  . Toradol [Ketorolac Tromethamine] Nausea Only    FAMILY HISTORY: Family History  Problem Relation Age of Onset  . Diabetes Mother   . Hypertension Mother   . Diabetes Father   . Hypertension Father   . Arthritis Father   . Cancer Maternal Grandmother   . Cancer Maternal Grandfather   . Alcohol abuse Neg Hx   . Anxiety disorder Neg Hx   . Bipolar disorder Neg Hx   . Drug abuse Neg Hx   . Depression Neg Hx     SOCIAL HISTORY: Social History   Social History  . Marital status: Single    Spouse name: N/A  . Number of children: 0  . Years of education: College   Occupational History  .  Other    disability   Social History Main Topics  . Smoking status: Current Every Day Smoker    Packs/day: 0.25    Years: 10.00    Types: Cigarettes  . Smokeless tobacco: Never Used  . Alcohol use No     Comment: Rare  . Drug use: No  . Sexual activity: Yes    Birth control/ protection: None   Other Topics Concern  . Not on file   Social History Narrative   Patient lives at home with fiance in [place]. Born and raised in [place], [place] by  parents. Pt has one younger sister. Pt has a Best boy. Pt worked from 1999-2006. She stopped due to MS and is currently on disability. Pt is pregnant with her 1st child. Married for less than 1 yr in 1999 that ended in divorce.             Caffeine Use: 1 20oz soda daily    REVIEW OF SYSTEMS: Constitutional: No fevers, chills, or sweats, no generalized fatigue, change in appetite Eyes: No visual changes, double vision, eye pain Ear, nose and throat: No hearing loss, ear pain, nasal congestion, sore throat Cardiovascular: No chest pain, palpitations Respiratory:  No shortness of breath at rest or with exertion, wheezes GastrointestinaI: No nausea, vomiting, diarrhea, abdominal pain, fecal incontinence Genitourinary:  No dysuria, urinary retention or frequency Musculoskeletal:  No neck pain, back pain Integumentary: No rash, pruritus, skin lesions Neurological: as above Psychiatric: + depression, anxiety Endocrine: No palpitations, fatigue, diaphoresis, mood swings, change in appetite, change in weight, increased thirst Hematologic/Lymphatic:  No anemia, purpura, petechiae. Allergic/Immunologic: no itchy/runny eyes, nasal congestion, recent allergic reactions, rashes  PHYSICAL EXAM: Vitals:   08/02/15 0824  BP: 120/76  Pulse: 98   General: No acute distress, sitting in wheelchair Head: Normocephalic/atraumatic Eyes: Fundoscopic exam shows bilateral sharp discs, no vessel changes, exudates, or hemorrhages Neck: supple, no paraspinal tenderness, full range of motion Back: No paraspinal tenderness Heart: regular rate and rhythm Lungs: Clear to auscultation bilaterally. Vascular: No carotid bruits. Skin/Extremities: No rash, no edema Neurological Exam: Mental status: alert and oriented to person, place, and time, no dysarthria or aphasia, Fund of knowledge is appropriate. Recent and remote memory are intact. Attention and concentration are normal. Able  to name objects and repeat phrases. Cranial nerves: CN I: not tested CN II: pupils equal, round and reactive to light,no APD, visual fields intact, fundi unremarkable. CN III, IV, VI: full range  of motion, no nystagmus, no ptosis CN V: facial sensation intact CN VII: upper and lower face symmetric CN VIII: hearing intact to finger rub CN IX, X: gag intact, uvula midline CN XI: sternocleidomastoid and trapezius muscles intact CN XII: tongue midline Bulk & Tone: normal, no fasciculations. Motor: 5/5 throughout with no pronator drift. Sensation: decreased pin on right forearm, decreased pin on both LE Deep Tendon Reflexes: +2 throughout, no ankle clonus Plantar responses: downgoing bilaterally Cerebellar: no incoordination on finger to nose testing Gait: slow and cautious, can take a few steps then reports knee pain Tremor: none  IMPRESSION: This is a 35 yo RH woman with a history of relapsing-remitting multiple sclerosis, headaches, anxiety with panic attacks, conversion disorder with psychogenic seizures, now 4-weeks post-partum, reporting recurrence of symptoms she was having pre-pregnancy, with paresthesias and numbness radiating up from her feet, paresthesias in her hands. She also has pain and had been seeing Pain management in [place]. MRI brain and C-spine do not show any active demyelinating lesions. She is not breastfeeding. Would hold off on IV steroids for now. She will resume Gilenya. EMG/NCV of the right UE and LE will be ordered to further evaluate her sensory symptoms. She would like to follow-up in a closer Pain Mgt office and will be referred locally. She will follow-up in 3 months and knows to call for any changes.   Thank you for allowing me to participate in her care.  Please do not hesitate to call for any questions or concerns.  The duration of this appointment visit was 25 minutes of face-to-face time with the patient.  Greater than 50% of this time was spent in  counseling, explanation of diagnosis, planning of further management, and coordination of care.   Ellouise Newer, M.D.   CC: Dr. Harolyn Rutherford

## 2015-08-02 NOTE — Progress Notes (Signed)
Note sent to [facility].

## 2015-08-02 NOTE — Patient Instructions (Signed)
1. Restart Gilenya 2. Schedule EMG/NCV of right UE and LE 3. Refer to Pain Management  4. Follow-up in 3 months

## 2015-08-03 ENCOUNTER — Other Ambulatory Visit: Payer: Self-pay | Admitting: Obstetrics and Gynecology

## 2015-08-03 ENCOUNTER — Telehealth: Payer: Self-pay | Admitting: Neurology

## 2015-08-03 NOTE — Telephone Encounter (Signed)
Referral faxed to [facility] at [phone] with confirmation received. They will contact patient directly to schedule an appt.

## 2015-08-07 ENCOUNTER — Telehealth: Payer: Self-pay | Admitting: Neurology

## 2015-08-07 NOTE — Telephone Encounter (Signed)
Emily [name] 04-14-1980. She called needing to see if Dr. Delice Lesch could reinstate her CAPS? She said since she has had her baby her symptoms have come back. Her number is [phone] you can reach her or her mom [name]. Thank you

## 2015-08-08 NOTE — Telephone Encounter (Signed)
Left message on machine for patient to call back.

## 2015-08-08 NOTE — Telephone Encounter (Signed)
Patient states CAPS is an assistance program through DSS that sends help to the home (like home healthcare). She states she needs help with daily assistance since she is in a wheelchair and her baby is now home. Made her aware to go through her PCP about this but gave her the number to medical records in case they need notes from our office. She expressed understanding with this plan.

## 2015-08-08 NOTE — Telephone Encounter (Signed)
Do you know what this means?

## 2015-08-08 NOTE — Telephone Encounter (Signed)
I don't know, pls see if this is something neurological, if not maybe PCP can do? Thanks!

## 2015-08-09 DIAGNOSIS — L03012 Cellulitis of left finger: Secondary | ICD-10-CM | POA: Diagnosis not present

## 2015-08-09 DIAGNOSIS — G35 Multiple sclerosis: Secondary | ICD-10-CM | POA: Diagnosis not present

## 2015-08-09 DIAGNOSIS — F329 Major depressive disorder, single episode, unspecified: Secondary | ICD-10-CM | POA: Diagnosis not present

## 2015-08-09 DIAGNOSIS — R569 Unspecified convulsions: Secondary | ICD-10-CM | POA: Diagnosis not present

## 2015-08-09 DIAGNOSIS — I10 Essential (primary) hypertension: Secondary | ICD-10-CM | POA: Diagnosis not present

## 2015-08-10 ENCOUNTER — Telehealth: Payer: Self-pay | Admitting: Neurology

## 2015-08-10 NOTE — Telephone Encounter (Signed)
PT called and wants to know if she has had an appointment set up for her with pain management/[facility] CB# [phone]

## 2015-08-10 NOTE — Telephone Encounter (Signed)
I called patient back and informed her that referral was sent to [facility].  I gave her the phone number to call and set up an appointment.

## 2015-08-11 ENCOUNTER — Other Ambulatory Visit: Payer: Self-pay | Admitting: Obstetrics and Gynecology

## 2015-08-11 ENCOUNTER — Other Ambulatory Visit (HOSPITAL_COMMUNITY): Payer: Self-pay | Admitting: Psychiatry

## 2015-08-11 DIAGNOSIS — F332 Major depressive disorder, recurrent severe without psychotic features: Secondary | ICD-10-CM

## 2015-08-11 DIAGNOSIS — F445 Conversion disorder with seizures or convulsions: Secondary | ICD-10-CM

## 2015-08-11 DIAGNOSIS — F411 Generalized anxiety disorder: Secondary | ICD-10-CM

## 2015-08-15 ENCOUNTER — Ambulatory Visit (INDEPENDENT_AMBULATORY_CARE_PROVIDER_SITE_OTHER): Payer: Medicare Other | Admitting: Neurology

## 2015-08-15 DIAGNOSIS — R202 Paresthesia of skin: Secondary | ICD-10-CM | POA: Diagnosis not present

## 2015-08-15 DIAGNOSIS — G5603 Carpal tunnel syndrome, bilateral upper limbs: Secondary | ICD-10-CM

## 2015-08-15 NOTE — Procedures (Signed)
[facility]  [address], [address]  [place], [place] [address] Tel: [phone] Fax:  [phone] Test Date:  08/15/2015  Patient: Emily [name] DOB: 1980-11-10 Physician: Narda Amber, DO  Sex: Female Height: 5\' 4"  Ref Phys: [name]  ID#: [id number] Temp: 33.2C Technician: Jerilynn Mages. Dean   Patient Complaints: This is a 35 year old female referred for evaluation of tingling and numbness in all extremities.  NCV & EMG Findings: Extensive electrodiagnostic testing of the right upper and lower extremities and additional studies of the left upper extremity shows: 1. Bilateral median sensory responses are prolonged (R3.9, L3.7 ms) and have normal amplitude. Right ulnar sensory response is within normal limits. 2. Bilateral median motor responses show mildly prolonged latency  (R4.5, L4.2 ms) and normal amplitude. Right ulnar motor responses within normal limits. 3. Right sural and superficial peroneal sensory responses are within normal limits. 4. Right peroneal and tibial motor responses are within normal limits. 5. There is no evidence of active or chronic motor axonal loss changes affecting any of the tested muscles. Motor unit configuration and recruitment pattern is within normal limits.  Impression: 1. Bilateral median neuropathy, at or distal to the wrist, consistent with the clinical diagnosis of carpal tunnel syndrome. Overall, these findings are mild-to-moderate in degree electrically. 2. There is no evidence of a sensorimotor polyneuropathy or cervical/lumbosacral radiculopathy affecting the right side.   ___________________________ Narda Amber, DO    Nerve Conduction Studies Anti Sensory Summary Table   Site NR Peak (ms) Norm Peak (ms) P-T Amp (V) Norm P-T Amp  Left Median Anti Sensory (2nd Digit)  33.2C  Wrist    3.7 <3.4 51.3 >20  Right Median Anti Sensory (2nd Digit)  33.2C  Wrist    3.9 <3.4 29.2 >20  Right Sup Peroneal Anti Sensory (Ant Lat Mall)   33.2C  12 cm    4.1 <4.5 15.5 >5  Right Sural Anti Sensory (Lat Mall)  33.2C  Calf    3.4 <4.5 10.7 >5  Right Ulnar Anti Sensory (5th Digit)  33.2C  Wrist    2.7 <3.1 18.8 >12   Motor Summary Table   Site NR Onset (ms) Norm Onset (ms) O-P Amp (mV) Norm O-P Amp Site1 Site2 Delta-0 (ms) Dist (cm) Vel (m/s) Norm Vel (m/s)  Left Median Motor (Abd Poll Brev)  33.2C  Wrist    4.2 <3.9 8.9 >6 Elbow Wrist 3.9 22.0 56 >50  Elbow    8.1  8.6         Right Median Motor (Abd Poll Brev)  33.2C  Wrist    4.5 <3.9 7.2 >6 Elbow Wrist 3.8 21.0 55 >50  Elbow    8.3  7.2         Right Peroneal Motor (Ext Dig Brev)  33.2C  Ankle    3.3 <5.5 5.0 >3 B Fib Ankle 6.0 33.0 55 >40  B Fib    9.3  5.0  Poplt B Fib 1.7 10.0 59 >40  Poplt    11.0  4.9         Right Tibial Motor (Abd Hall Brev)  33.2C  Ankle    2.7 <6.0 12.3 >8 Knee Ankle 8.2 39.0 48 >40  Knee    10.9  11.0         Right Ulnar Motor (Abd Dig Minimi)  33.2C  Wrist    2.3 <3.1 12.9 >7 B Elbow Wrist 2.9 18.0 62 >50  B Elbow    5.2  12.8  A Elbow B Elbow 1.5 10.0 67 >50  A Elbow    6.7  12.5          EMG   Side Muscle Ins Act Fibs Psw Fasc Number Recrt Dur Dur. Amp Amp. Poly Poly. Comment  Right 1stDorInt Nml Nml Nml Nml Nml Nml Nml Nml Nml Nml Nml Nml N/A  Right Abd Poll Brev Nml Nml Nml Nml Nml Nml Nml Nml Nml Nml Nml Nml N/A  Right PronatorTeres Nml Nml Nml Nml Nml Nml Nml Nml Nml Nml Nml Nml N/A  Right Biceps Nml Nml Nml Nml Nml Nml Nml Nml Nml Nml Nml Nml N/A  Right Triceps Nml Nml Nml Nml Nml Nml Nml Nml Nml Nml Nml Nml N/A  Right Deltoid Nml Nml Nml Nml Nml Nml Nml Nml Nml Nml Nml Nml N/A  Right AntTibialis Nml Nml Nml Nml Nml Nml Nml Nml Nml Nml Nml Nml N/A  Right Gastroc Nml Nml Nml Nml Nml Nml Nml Nml Nml Nml Nml Nml N/A  Right Flex Dig Long Nml Nml Nml Nml Nml Nml Nml Nml Nml Nml Nml Nml N/A  Right RectFemoris Nml Nml Nml Nml Nml Nml Nml Nml Nml Nml Nml Nml N/A  Right GluteusMed Nml Nml Nml Nml Nml Nml Nml Nml Nml Nml Nml  Nml N/A  Left Abd Poll Brev Nml Nml Nml Nml Nml Nml Nml Nml Nml Nml Nml Nml N/A  Left PronatorTeres Nml Nml Nml Nml Nml Nml Nml Nml Nml Nml Nml Nml N/A      Waveforms:

## 2015-08-16 ENCOUNTER — Telehealth: Payer: Self-pay | Admitting: Neurology

## 2015-08-16 NOTE — Telephone Encounter (Signed)
Spoke with patient and gave her the name and number of the appropriate person at [facility], [phone]).  I told her to call the number and they would take care of everything necessary to get her started on the medication.  Patient acknowledged and understood

## 2015-08-16 NOTE — Telephone Encounter (Signed)
Emily [name] August 27, 1980. Her # is [id number].This is regarding Gilenya medication. Her Insurance did   approve it but needs Dr. Delice Lesch to call about it? She was also saying she may need to come in when she takes it. That it's a medication she needs to be monitored on at first. Thank  you

## 2015-08-21 ENCOUNTER — Ambulatory Visit: Payer: Self-pay | Admitting: *Deleted

## 2015-08-21 ENCOUNTER — Telehealth: Payer: Self-pay | Admitting: Neurology

## 2015-08-21 NOTE — Telephone Encounter (Signed)
Pls let her know that the EMG does show carpal tunnel syndrome in both hands. The nerves and muscles in her legs are normal. Recommend she start wearing a wrist splint on both hands, especially since she will be holding the baby a lot, so it won't worsen. If she has any other questions, she can keep appt later, otherwise reschedule follow-up for 3 months. Thanks!

## 2015-08-21 NOTE — Telephone Encounter (Signed)
Patient states that she was unsure if she needed to keep appt today. She saw [name] on 08-02-15 and saw Dr Posey Pronto on 08-15-15 for the EMG. She has not gotten the results back from the EMG yet. She did not know if we woul dcall her or if she needed to keep appt at 4 today. Please call [phone] or [phone] and let her know what to do

## 2015-08-21 NOTE — Telephone Encounter (Signed)
Notified pt, Pt states that she will call back to schedule a follow up appt for 3 mths. I will cancel todays appt as requested

## 2015-08-21 NOTE — Telephone Encounter (Signed)
Left VM to return our call

## 2015-08-29 ENCOUNTER — Telehealth: Payer: Self-pay | Admitting: Neurology

## 2015-08-29 NOTE — Telephone Encounter (Signed)
Order form for Gilenya was completed and faxed to them.

## 2015-08-29 NOTE — Telephone Encounter (Signed)
PT called and said she needs blood work and to see an optimologist before she can get her medication from [facility] and wanted [name] to refer her to [facility]  CB#[phone]

## 2015-09-02 ENCOUNTER — Other Ambulatory Visit: Payer: Self-pay | Admitting: Medical

## 2015-09-04 ENCOUNTER — Other Ambulatory Visit: Payer: Self-pay | Admitting: Obstetrics and Gynecology

## 2015-09-07 ENCOUNTER — Encounter: Payer: Self-pay | Admitting: General Practice

## 2015-09-07 ENCOUNTER — Encounter: Payer: Self-pay | Admitting: Family

## 2015-09-07 ENCOUNTER — Ambulatory Visit (INDEPENDENT_AMBULATORY_CARE_PROVIDER_SITE_OTHER): Payer: Medicare Other | Admitting: Clinical

## 2015-09-07 ENCOUNTER — Ambulatory Visit (INDEPENDENT_AMBULATORY_CARE_PROVIDER_SITE_OTHER): Payer: Medicare Other | Admitting: Family

## 2015-09-07 DIAGNOSIS — F411 Generalized anxiety disorder: Secondary | ICD-10-CM

## 2015-09-07 DIAGNOSIS — Z3202 Encounter for pregnancy test, result negative: Secondary | ICD-10-CM | POA: Diagnosis not present

## 2015-09-07 DIAGNOSIS — Z30017 Encounter for initial prescription of implantable subdermal contraceptive: Secondary | ICD-10-CM | POA: Diagnosis not present

## 2015-09-07 DIAGNOSIS — F1910 Other psychoactive substance abuse, uncomplicated: Secondary | ICD-10-CM

## 2015-09-07 LAB — POCT URINALYSIS DIP (DEVICE)
BILIRUBIN URINE: NEGATIVE
GLUCOSE, UA: NEGATIVE mg/dL
KETONES UR: NEGATIVE mg/dL
LEUKOCYTES UA: NEGATIVE
Nitrite: NEGATIVE
Protein, ur: NEGATIVE mg/dL
SPECIFIC GRAVITY, URINE: 1.02 (ref 1.005–1.030)
Urobilinogen, UA: 0.2 mg/dL (ref 0.0–1.0)
pH: 5.5 (ref 5.0–8.0)

## 2015-09-07 LAB — POCT PREGNANCY, URINE: Preg Test, Ur: NEGATIVE

## 2015-09-07 NOTE — Progress Notes (Signed)
  ASSESSMENT: Pt currently experiencing Generalized anxiety disorder. Pt needs to f/u with her psychiatrist.  Pt would benefit from psychoeducation and brief therapeutic interventions regarding coping with anxiety and panic attacks. Pt may benefit from group social support. Stage of Change: determination  PLAN: 1. F/U with behavioral health clinician as needed 2. Psychiatric Medications: Prozac, Buspar 3. Behavioral recommendations:   -Go to psychiatry appointment w Dr. Doyne Keel on 10-05-15 -Continue to attend church counseling sessions -Practice doing daily relaxation breathing exercises, as practiced in office visit today -Consider "worry hour" strategy for prioritizing life stressors -Read educational material regarding coping with panic attacks -Consider Mom Talk mom-led group support at [facility] [place]: Pt. referred by Kathrine Haddock, CNM, for symptoms of anxiety Pt. reports the following symptoms/concerns: Pt states that she has felt an increase in anxiety and panic attacks after birth. She will start back with psychiatry, and goes to counseling at church, but wants to know that the increase is normal postpartum, and is open to practical strategies to cope further.  Duration of problem: Undetermined number of years; increase in past two months Severity: severe   OBJECTIVE: Orientation & Cognition: Oriented x3. Thought processes normal and appropriate to situation. Mood: anxious Affect: appropriate Appearance: appropriate Risk of harm to self or others: no known risk of harm to self or others Substance use: Did not address today Assessments administered: PHQ9: 7/ GAD7: 11  Diagnosis: Generalized anxiety disorder CPT Code: F41.1  -------------------------------------------- Other(s) present in the room: none  Time spent with patient in exam room: 11:00-11:30am  Depression screen [facility] 2/9 09/07/2015 05/09/2015  Decreased Interest - 0  Down, Depressed,  Hopeless 1 0  PHQ - 2 Score 1 0  Altered sleeping 2 -  Tired, decreased energy 1 -  Change in appetite 0 -  Feeling bad or failure about yourself  1 -  Trouble concentrating 2 -  Moving slowly or fidgety/restless 0 -  Suicidal thoughts 0 -  PHQ-9 Score 7 -   GAD 7 : Generalized Anxiety Score 09/07/2015  Nervous, Anxious, on Edge 3  Control/stop worrying 3  Worry too much - different things 2  Trouble relaxing 2  Restless 0  Easily annoyed or irritable 1  Afraid - awful might happen 0  Total GAD 7 Score 11

## 2015-09-07 NOTE — Progress Notes (Signed)
Subjective:     Emily [name] is a 35 y.o. female who presents for a postpartum visit. She is 8 weeks postpartum following a low cervical transverse Cesarean section. I have fully reviewed the prenatal and intrapartum course. The delivery was at 35.1 gestational weeks. Outcome: primary cesarean section, low transverse incision. Anesthesia: general. Postpartum course has been unremarkable. Baby's course has been unremarkable since going home on August 27th. Baby is up to 5 lbs!!!  Baby is feeding by bottle - Similac Neosure. Bleeding no bleeding. Bowel function is normal. Bladder function is normal. Patient is not sexually active. Contraception method is none. Patient is interested in [place]. Depression/Anxiety screening is positive.  Plans to see Roselyn Reef.  Reports not taking blood pressure medication today; appt with PCP to readjust meds at beginning of September.   The following portions of the patient's history were reviewed and updated as appropriate: allergies, current medications, past family history, past medical history, past social history, past surgical history and problem list.  Review of Systems Pertinent items are noted in HPI.   Objective:    BP (!) 150/111   Pulse 97   Ht 5\' 4"  (1.626 m)   Wt 240 lb (108.9 kg)   LMP 09/04/2015   Breastfeeding? No   BMI 41.20 kg/m          General:  alert, cooperative and appears stated age   Breasts:  inspection negative, no nipple discharge or bleeding, no masses or nodularity palpable  Lungs: clear to auscultation bilaterally  Heart:  regular rate and rhythm, S1, S2 normal, no murmur, click, rub or gallop  Abdomen: soft, non-tender; bowel sounds normal; no masses,  no organomegaly; incision healing well, no signs of infection  Pelvic exam not indicated - not bleeding, no pain at vaginal area  NEXPLANON INSERTION: Appropriate time out taken. Nexlanon site (left arm) identified and thea area was prepped in usual sterile fashon. 2 cc of  1% lidocaine was used to anesthetize the area starting with the distal end.   Next, the area was cleansed with betadine and the Nexplanon was inserted without difficulty.  Pressure bandage was applied.  Pt was instructed to remove pressure bandage in a few hours, and keep insertion site covered with a bandaid for 3 days.   Assessment:     Normal postpartum exam. Pap smear not done at today's visit (Pt will reschedule).   Nexplanon Insertion  Plan:    1. Contraception: Nexplanon 2. Keep scheduled appt with PCP for blood pressure management 3. Follow up as needed.    Venia Carbon Michiel Cowboy, CNM

## 2015-09-15 ENCOUNTER — Telehealth: Payer: Self-pay | Admitting: *Deleted

## 2015-09-15 NOTE — Telephone Encounter (Signed)
Emily [name] with [facility] called and said that she and Tim have been unable to get in touch with the patient so that she can start her medication.  I called patient also and left a message for her to call Emily [name] at [phone].

## 2015-09-15 NOTE — Telephone Encounter (Signed)
Noted, thanks!

## 2015-09-18 DIAGNOSIS — I10 Essential (primary) hypertension: Secondary | ICD-10-CM | POA: Diagnosis not present

## 2015-09-18 DIAGNOSIS — G35 Multiple sclerosis: Secondary | ICD-10-CM | POA: Diagnosis not present

## 2015-09-18 DIAGNOSIS — G5603 Carpal tunnel syndrome, bilateral upper limbs: Secondary | ICD-10-CM | POA: Diagnosis not present

## 2015-09-18 DIAGNOSIS — Z23 Encounter for immunization: Secondary | ICD-10-CM | POA: Diagnosis not present

## 2015-09-18 DIAGNOSIS — F419 Anxiety disorder, unspecified: Secondary | ICD-10-CM | POA: Diagnosis not present

## 2015-09-18 DIAGNOSIS — F17210 Nicotine dependence, cigarettes, uncomplicated: Secondary | ICD-10-CM | POA: Diagnosis not present

## 2015-09-19 ENCOUNTER — Ambulatory Visit: Payer: Self-pay | Admitting: Neurology

## 2015-09-20 ENCOUNTER — Telehealth (HOSPITAL_COMMUNITY): Payer: Self-pay

## 2015-09-20 ENCOUNTER — Encounter: Payer: Self-pay | Admitting: Neurology

## 2015-09-20 ENCOUNTER — Ambulatory Visit (INDEPENDENT_AMBULATORY_CARE_PROVIDER_SITE_OTHER): Payer: Medicare Other | Admitting: Neurology

## 2015-09-20 VITALS — BP 126/80 | HR 85 | Temp 98.5°F | Ht 64.0 in | Wt 241.1 lb

## 2015-09-20 DIAGNOSIS — G43109 Migraine with aura, not intractable, without status migrainosus: Secondary | ICD-10-CM

## 2015-09-20 MED ORDER — TOPIRAMATE 50 MG PO TABS: ORAL_TABLET | ORAL | 6 refills | Status: DC

## 2015-09-20 MED ORDER — DIVALPROEX SODIUM 500 MG PO DR TAB: DELAYED_RELEASE_TABLET | ORAL | 6 refills | Status: DC

## 2015-09-20 NOTE — Patient Instructions (Addendum)
1. Start Depakote 500mg : Take 1 tablet at night for 1 week, then increase to 1 tablet twice a day 2. Start Topamax 50mg : Take 1/2 tablet twice a day for 1 week, then increase to 1 tablet twice a day 3. We will get you set up for Botox with Dr. Posey Pronto 4. Bloodwork for CBC, LFTs and bilirubin, VZV antibody 5 Refer to ophthalmology for evaluation prior to starting Gilenya 6. Follow-up in 3 months

## 2015-09-20 NOTE — Progress Notes (Signed)
NEUROLOGY FOLLOW UP OFFICE NOTE  Emily [name] BP:[id number]  HISTORY OF PRESENT ILLNESS: I had the pleasure of seeing Emily [name] in follow-up in the neurology clinic on 09/20/2015.  The patient was last seen 6 weeks ago for several neurological conditions. She has a history of MS and stopped Gilenya during pregnancy. We are working on getting her back on the medication, she is needing an ophthalmology visit and bloodwork prior to restarting medication. She also has migraines and stopped Depakote and Topamax when pregnant. She was also getting Botox and nerve blocks for migraines. She reports that headaches have worsened now that she has her 69 month old baby, she cannot sleep even when the baby is sleeping. She has had a migraine for the past week. Imitrex is not helping as much anymore. She is also reporting worsening anxiety. When anxiety worsens, she would have a panic attack which leads to a shaking spell. Last fall was when she was pregnant. She reported paresthesias in her hands and feet, EMG/NCV done on both UE and right LE showed bilateral carpal tunnel syndrome, mild to moderate in degree, no evidence of polyneuropathy or radiculopathy on the right side. She is wearing bilateral wrist splints today.  HPI 03/28/15: This is a pleasant 35 yo RH woman with a history of relapsing-remitting multiple sclerosis, headaches, anxiety with panic attacks, conversion disorder with seizures. She presented at [redacted] weeks pregnant to determine if diazepam for anxiety/seizures would be a medication she can use, understanding this is pregnancy category D versus risks of injuring herself from falls when she has the psychogenic seizures. Records from her previous neurologists at [facility] were reviewed. She was diagnosed with MS in June 2008 while living in [place] state, she started having right thigh pain and right eye blurred vision. She was diagnosed with right optic neuritis and underwent MRI brain and  lumbar puncture which confirmed a diagnosis of MS. 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 She has been on Gilenya since 2014 without any relapse. This was discontinued in December 2016 when she found out she was pregnant. She has a history of chronic pain and migraines, and was taking Depakote and Topamax for headache prophylaxis, also stopped in December 2016. She reports having Botox for her migraines, but so far migraines are not as bad as they were.  She started having shaking episodes in April 2016. Initially Depakote dose was increased to 500mg  BID. She was back in May 2016 with multiple episodes of syncope, palpitations, a sensation of her heart jumping out of her chest. She also reported lightheadedness, facial numbness, and numbness in her hands and feet. On her last visit in June 2016 at [facility], shaking episodes were discussed. Events always occur after a stressful event. She had a spell in [place] where she had a normal EEG and was diagnosed with a conversion disorder. She had seen Dr. Darleene Cleaver and reportedly told her he could not help her because "her case was too complex." She started seeing a psychiatrist in [place], however since she switched neurologists, she reports that he cannot see her anymore. She has not been able to establish care with [facility] and continues to have anxiety and panic attacks "all the time." She is diaphoretic in the office today and reports she always feels anxious. When she starts having one of her seizures, she would have  significant anxiety, "my body can't deal and goes into a seizure," she hears her heartbeat in her ears. If it slowly builds up, she can get herself to a safe place, however other times it "just hits me," and she falls to the ground and has injured herself. She reports falling out one time in the  bathroom during a doctor visit. She is currently on Fluoxetine and Buspirone. Clonazepam in the past did not help. She would usually go to the ER for these spells, which would quiet down once she gets Benadryl and Diazepam.  She delivered early at [redacted] weeks gestation last July 12, 2015. A week after delivery, she started having numbness on the top of both feet, pain in the balls of her feet, with numbness traveling up to her mid-back. She also has numbness in both forearms, and reports her palms feel the same as her soles, with constant stabbing burning pain. She had these symptoms pre-pregnancy but states the numbness only went up to her knees. During pregnancy, all her symptoms were less bothersome. She has been following with Pain management in [facility] and would occasionally take Oxycodone prior to her pregnancy.  Diagnostic Data:  I personally reviewed MRI brain with and without contrast done with [facility] last 10/02/13 which did not show any acute changes, there was multiple T2/FLAIR lesions in the subcortical and deep white matter regions, no abnormal enhancement. Hippocampi appear asymmetric, smaller on the right without abnormal signal or enhancement.  MRI C-spine with and without contrast 10/18/13 showed hazy T2 hyperintensity within the cord from C2-3 to C4-5 without abnormal enhancement.  per [facility] - MRI brain done 03/18/14: "No change in numerous multifocal T2 hyperintense white matter lesions relative to prior MRI from 04/17/2013 most consistent with demyelinating lesions given history of multiple sclerosis. No evidence of active demyelination."  MRI cervical spine done 03/18/14: "Relative to prior MRI from 10/20/2009, no change in patchy foci of increased T2 signal in the dorsal cord at C2- C4 in keeping with demyelinating lesions in this patient with a clinical history of multiple sclerosis.  No evidence of enhancement to suggest active demyelination. Moderately advanced degenerative  disease at C5-C6 and C6-C7 with interval development of left eccentric central, foraminal entry zone and foraminal disc extrusion at C6-C7 exerting mass effect on the exiting C7 and descending C8 roots.  MRI brain and C-spine 07/30/2015 with and without contrast : There was no abnormal enhancement. There were no changes from previous MRI brain in 09/2013 with multiple bilateral subcortical greater than 10 periventricular T2 hyperintensities. There were no cord lesions seen up to T3-4. There was chronic degenerative disc disease and central canal stenosis at C5-6 and C6-7, no abnormal cord signal, unchanged from 06/2014.   PAST MEDICAL HISTORY: Past Medical History:  Diagnosis Date  . Anxiety   . Asthma as a child  . Chronic back pain   . Chronic pain   . Conversion disorder   . Depression   . GERD (gastroesophageal reflux disease)   . Headache(784.0)    migraines  . HTN (hypertension) 02/27/2015  . JC virus antibody positive   . Migraines   . MS (multiple sclerosis) ([place])    06-2006  . Multiple sclerosis ([place])   . Ovarian cyst   . Panic attack   . Perforated bowel ([place]) 2009  . Pseudoseizures   . PTSD (post-traumatic stress disorder)   . S/P emergency C-section   . Urinary urgency     MEDICATIONS: Current Outpatient Prescriptions  on File Prior to Visit  Medication Sig Dispense Refill  . Acetaminophen-Codeine (TYLENOL/CODEINE #3) 300-30 MG tablet Take 1 tablet by mouth every 4 (four) hours as needed for pain.    Marland Kitchen albuterol (PROVENTIL HFA;VENTOLIN HFA) 108 (90 Base) MCG/ACT inhaler Inhale 2 puffs into the lungs every 6 (six) hours as needed for wheezing or shortness of breath. 1 Inhaler 0  . busPIRone (BUSPAR) 5 MG tablet Take 1 tablet (5 mg total) by mouth 3 (three) times daily. 90 tablet 2  . cyclobenzaprine (FLEXERIL) 10 MG tablet TAKE 1 TABLET BY MOUTH 3 TIMES A DAY AS NEEDED FOR MUSCLE SPASMS 30 tablet 0  . diazepam (VALIUM) 5 MG tablet Take 1 tablet as needed for panic attacks  leading to seizures. (Patient taking differently: Take 5 mg by mouth daily. Take 1 tablet as needed for panic attacks leading to seizures.) 30 tablet 1  . diphenhydrAMINE (SOMINEX) 25 MG tablet Take 25 mg by mouth at bedtime as needed for sleep.    Marland Kitchen EPINEPHrine (EPIPEN) 0.3 mg/0.3 mL SOAJ injection Inject 0.3 mg into the muscle as needed (allergic reaction). Reported on 03/28/2015    . FLUoxetine (PROZAC) 20 MG tablet Take 1 tablet (20 mg total) by mouth daily. 30 tablet 2  . hydrochlorothiazide (HYDRODIURIL) 25 MG tablet Take 1 tablet (25 mg total) by mouth daily. 30 tablet 1  . ibuprofen (ADVIL,MOTRIN) 600 MG tablet Take 1 tablet (600 mg total) by mouth every 6 (six) hours as needed for moderate pain. Do not take on an empty stomach. 60 tablet 0  . Multiple Vitamins-Minerals (MULTIVITAMIN ADULT PO) Take 1 tablet by mouth daily.     . promethazine (PHENERGAN) 25 MG tablet Take 0.5-1 tablets (12.5-25 mg total) by mouth every 6 (six) hours as needed for nausea. 30 tablet 0  . ranitidine (ZANTAC) 300 MG capsule Take 300 mg by mouth at bedtime. Reported on 07/21/2015    . simethicone (MYLICON) 80 MG chewable tablet Chew 1 tablet (80 mg total) by mouth 4 (four) times daily as needed for flatulence. 30 tablet 0  . SUMAtriptan (IMITREX) 100 MG tablet TAKE 1 TABLET BY MOUTH EVERY 2 HOURS AS NEEDED FOR MIGREAINE MAY REPEAT IN 2 HRS IF HEADACHE PERSIST 9 tablet 0   Current Facility-Administered Medications on File Prior to Visit  Medication Dose Route Frequency Provider Last Rate Last Dose  . dexamethasone (DECADRON) injection 10 mg  10 mg Intravenous Once Milton Ferguson, MD      . metoCLOPramide (REGLAN) injection 10 mg  10 mg Intravenous Once Milton Ferguson, MD        ALLERGIES: Allergies  Allergen Reactions  . Amitriptyline Hypertension and Other (See Comments)    hypertension  . Baclofen Hives and Shortness Of Breath  . Cymbalta [Duloxetine Hcl] Shortness Of Breath and Rash  . Gabapentin Shortness  Of Breath and Rash  . Monosodium Glutamate Anaphylaxis  . Other Shortness Of Breath and Rash    MSG, beans (vomiting)  . Vicodin [Hydrocodone-Acetaminophen] Hives and Nausea And Vomiting    Projectile vomiting  . Alprazolam Other (See Comments)    Lethargy  . Magnesium Salicylate Hives and Itching  . Rizatriptan Nausea And Vomiting and Other (See Comments)    GI upset, Projectile vomiting  . Tizanidine Hives  . Tramadol Other (See Comments)    Unspecified  . Adhesive [Tape] Other (See Comments)    Skin irritation Paper tape is ok  . Lamotrigine Rash  . Toradol [Ketorolac Tromethamine] Nausea Only  FAMILY HISTORY: Family History  Problem Relation Age of Onset  . Diabetes Mother   . Hypertension Mother   . Diabetes Father   . Hypertension Father   . Arthritis Father   . Cancer Maternal Grandmother   . Cancer Maternal Grandfather   . Alcohol abuse Neg Hx   . Anxiety disorder Neg Hx   . Bipolar disorder Neg Hx   . Drug abuse Neg Hx   . Depression Neg Hx     SOCIAL HISTORY: Social History   Social History  . Marital status: Single    Spouse name: N/A  . Number of children: 0  . Years of education: College   Occupational History  .  Other    disability   Social History Main Topics  . Smoking status: Former Smoker    Packs/day: 0.25    Years: 10.00    Types: Cigarettes  . Smokeless tobacco: Never Used  . Alcohol use No     Comment: Rare  . Drug use: No  . Sexual activity: Yes    Birth control/ protection: None   Other Topics Concern  . Not on file   Social History Narrative   Patient lives at home with fiance in [place]. Born and raised in [place], [place] by parents. Pt has one younger sister. Pt has a Best boy. Pt worked from 1999-2006. She stopped due to MS and is currently on disability. Pt is pregnant with her 1st child. Married for less than 1 yr in 1999 that ended in divorce.             Caffeine Use: 1 20oz soda daily     REVIEW OF SYSTEMS: Constitutional: No fevers, chills, or sweats, no generalized fatigue, change in appetite Eyes: No visual changes, double vision, eye pain Ear, nose and throat: No hearing loss, ear pain, nasal congestion, sore throat Cardiovascular: No chest pain, palpitations Respiratory:  No shortness of breath at rest or with exertion, wheezes GastrointestinaI: No nausea, vomiting, diarrhea, abdominal pain, fecal incontinence Genitourinary:  No dysuria, urinary retention or frequency Musculoskeletal:  No neck pain, back pain Integumentary: No rash, pruritus, skin lesions Neurological: as above Psychiatric: + depression, anxiety Endocrine: No palpitations, fatigue, diaphoresis, mood swings, change in appetite, change in weight, increased thirst Hematologic/Lymphatic:  No anemia, purpura, petechiae. Allergic/Immunologic: no itchy/runny eyes, nasal congestion, recent allergic reactions, rashes  PHYSICAL EXAM: Vitals:   09/20/15 1518  BP: 126/80  Pulse: 85  Temp: 98.5 F (36.9 C)   General: No acute distress Head: Normocephalic/atraumatic Eyes: Fundoscopic exam shows bilateral sharp discs, no vessel changes, exudates, or hemorrhages Neck: supple, no paraspinal tenderness, full range of motion Back: No paraspinal tenderness Heart: regular rate and rhythm Lungs: Clear to auscultation bilaterally. Vascular: No carotid bruits. Skin/Extremities: No rash, no edema Neurological Exam: Mental status: alert and oriented to person, place, and time, no dysarthria or aphasia, Fund of knowledge is appropriate. Recent and remote memory are intact. Attention and concentration are normal. Able to name objects and repeat phrases. Cranial nerves: CN I: not tested CN II: pupils equal, round and reactive to light,no APD, visual fields intact, fundi unremarkable. CN III, IV, VI: full range of motion, no nystagmus, no ptosis CN V: facial sensation intact CN VII: upper and lower face  symmetric CN VIII: hearing intact to finger rub CN IX, X: gag intact, uvula midline CN XI: sternocleidomastoid and trapezius muscles intact CN XII: tongue midline Bulk & Tone: normal, no fasciculations. Motor: 5/5  throughout with no pronator drift. Sensation: decreased pin on right forearm, decreased pin on both LE (similar to prior visit) Deep Tendon Reflexes: +2 throughout, no ankle clonus Plantar responses: downgoing bilaterally Cerebellar: no incoordination on finger to nose testing Gait: slow and cautious, walking and does not use wheelchair or cane today Tremor: none  IMPRESSION: This is a 35 yo RH woman with a history of relapsing-remitting multiple sclerosis, headaches, anxiety with panic attacks, conversion disorder with psychogenic seizures. Her baby is now 50 months old, and she reports recurrence of symptoms she was having pre-pregnancy, with paresthesias and numbness radiating up from her feet, paresthesias in her hands. EMG/NCV showed bilateral carpal tunnel syndrome, she is now using bilateral wrist splints. She is reporting worsening migraines, and will be restarted on Depakote and Topamax that she was taking pre-pregnancy. She will slowly uptitrate, Depakote 500mg  qhs x 1 week, then increase back to 500mg  BID. She does not recall Topamax dose, will start at 25mg  BID x 1 week, then increase to 50mg  BID. She also used to take high doses of Lyrica, and will speak to her pain specialist once established. She was also getting Botox and nerve blocks, she will be set up for Botox. She has been unable to obtain Gilenya yet. MRI brain and C-spine do not show any active demyelinating lesions. She is not breastfeeding. Refer to ophthalmology for baseline eye exam, safety labs will be ordered. Continue follow-up with psychiatry for anxiety attacks that lead to psychogenic shaking episodes. She does not drive. She will follow-up in 3 months and knows to call for any changes.   Thank you for  allowing me to participate in her care.  Please do not hesitate to call for any questions or concerns.  The duration of this appointment visit was 25 minutes of face-to-face time with the patient.  Greater than 50% of this time was spent in counseling, explanation of diagnosis, planning of further management, and coordination of care.   Ellouise Newer, M.D.   CC: Dr. Harolyn Rutherford

## 2015-09-20 NOTE — Telephone Encounter (Signed)
Patient is calling to let you know that her anxiety is getting worse. Patient states that she can not focus, or complete tasks, she states that she is making sure that her baby is taken care of, but she feels like she is not prioritizing well. I did advise patient that I would not see you until Tuesday and if at any point between now and then if she felt she was in crisis she should call me or go to the ED for an evaluation. Please review and advise, thank you.

## 2015-09-22 ENCOUNTER — Other Ambulatory Visit: Payer: Medicare Other

## 2015-09-26 ENCOUNTER — Other Ambulatory Visit (HOSPITAL_COMMUNITY): Payer: Self-pay

## 2015-09-26 ENCOUNTER — Other Ambulatory Visit (HOSPITAL_COMMUNITY): Payer: Self-pay | Admitting: Psychiatry

## 2015-09-26 DIAGNOSIS — F411 Generalized anxiety disorder: Secondary | ICD-10-CM

## 2015-09-26 DIAGNOSIS — F4001 Agoraphobia with panic disorder: Secondary | ICD-10-CM

## 2015-09-26 MED ORDER — DIAZEPAM 5 MG PO TABS: 5.0000 mg | ORAL_TABLET | Freq: Every day | ORAL | 0 refills | Status: DC

## 2015-09-26 NOTE — Telephone Encounter (Signed)
Refill Valium enough to get to scheduled appt

## 2015-09-26 NOTE — Progress Notes (Signed)
Dr. Doyne Keel approved request for refill, but only enough to get to patient appointment. Patient is coming in on 9/28, an order for 9 tablets was called into patient pharmacy.

## 2015-09-26 NOTE — Telephone Encounter (Signed)
Called patient and left her a voicemail letting her know that Dr. Doyne Keel would discuss this at her apointment coming up, patient was able to get it moved up.

## 2015-10-05 ENCOUNTER — Ambulatory Visit (INDEPENDENT_AMBULATORY_CARE_PROVIDER_SITE_OTHER): Payer: Medicare Other | Admitting: Psychiatry

## 2015-10-05 ENCOUNTER — Encounter (HOSPITAL_COMMUNITY): Payer: Self-pay | Admitting: Psychiatry

## 2015-10-05 DIAGNOSIS — F4001 Agoraphobia with panic disorder: Secondary | ICD-10-CM | POA: Diagnosis not present

## 2015-10-05 DIAGNOSIS — F445 Conversion disorder with seizures or convulsions: Secondary | ICD-10-CM

## 2015-10-05 DIAGNOSIS — F411 Generalized anxiety disorder: Secondary | ICD-10-CM | POA: Diagnosis not present

## 2015-10-05 DIAGNOSIS — F332 Major depressive disorder, recurrent severe without psychotic features: Secondary | ICD-10-CM

## 2015-10-05 MED ORDER — FLUOXETINE HCL 20 MG PO TABS: 40.0000 mg | ORAL_TABLET | Freq: Every day | ORAL | 3 refills | Status: DC

## 2015-10-05 MED ORDER — BUSPIRONE HCL 10 MG PO TABS
10.0000 mg | ORAL_TABLET | Freq: Three times a day (TID) | ORAL | 3 refills | Status: DC
Start: 2015-10-05 — End: 2015-11-23

## 2015-10-05 MED ORDER — DIAZEPAM 5 MG PO TABS: 5.0000 mg | ORAL_TABLET | Freq: Every day | ORAL | 3 refills | Status: DC
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
## 2015-10-16 ENCOUNTER — Other Ambulatory Visit (HOSPITAL_COMMUNITY): Payer: Self-pay | Admitting: Psychiatry

## 2015-10-16 DIAGNOSIS — F411 Generalized anxiety disorder: Secondary | ICD-10-CM

## 2015-10-20 ENCOUNTER — Other Ambulatory Visit: Payer: Self-pay

## 2015-10-20 ENCOUNTER — Telehealth: Payer: Self-pay | Admitting: Neurology

## 2015-10-20 MED ORDER — SUMATRIPTAN SUCCINATE 100 MG PO TABS: 100.0000 mg | ORAL_TABLET | ORAL | 6 refills | Status: DC | PRN

## 2015-10-20 NOTE — Telephone Encounter (Signed)
Emily [name] 1980/08/06. She was calling in regarding her medication Sumatriptan. She had been having it prescribed through the [facility] while she was pregnant. Since having her baby they are no longer filling it for her. She is needing to have Dr. Delice Lesch fill it for her if she can.  She uses [facility] on [address]. Her # is [id number]. Thank you

## 2015-10-20 NOTE — Telephone Encounter (Signed)
Please advise 

## 2015-10-20 NOTE — Telephone Encounter (Signed)
RX SUMAtriptan 100mg  sent to pharmacy.

## 2015-10-20 NOTE — Telephone Encounter (Signed)
Contacted patient to let her know RX was sent to pharmacy.

## 2015-10-20 NOTE — Telephone Encounter (Signed)
Ok to send refills, I think she was getting 9 tabs a month, send 6 refills, thanks

## 2015-10-24 ENCOUNTER — Telehealth: Payer: Self-pay | Admitting: Neurology

## 2015-10-24 ENCOUNTER — Other Ambulatory Visit: Payer: Medicare Other

## 2015-10-24 DIAGNOSIS — G43109 Migraine with aura, not intractable, without status migrainosus: Secondary | ICD-10-CM | POA: Diagnosis not present

## 2015-10-24 NOTE — Telephone Encounter (Signed)
Patient notified letter will be wrote.

## 2015-10-24 NOTE — Telephone Encounter (Signed)
Please advise 

## 2015-10-24 NOTE — Telephone Encounter (Signed)
Call on the [phone] when calling back/Dawn

## 2015-10-24 NOTE — Telephone Encounter (Signed)
Emily [name] 1980-06-23.  Her # [id number]. She is needing a letter from Dr. Delice Lesch stating due to her MS she has to have Climate control for heat and air. She is needing it for her Electric bill? Thank you

## 2015-10-25 LAB — HEPATIC FUNCTION PANEL
ALBUMIN: 4 g/dL (ref 3.6–5.1)
ALT: 14 U/L (ref 6–29)
AST: 15 U/L (ref 10–30)
Alkaline Phosphatase: 84 U/L (ref 33–115)
BILIRUBIN TOTAL: 0.3 mg/dL (ref 0.2–1.2)
Bilirubin, Direct: 0 mg/dL (ref <=0.2)
Indirect Bilirubin: 0.3 mg/dL (ref 0.2–1.2)
Total Protein: 7.4 g/dL (ref 6.1–8.1)

## 2015-10-25 LAB — CBC
HCT: 38.9 % (ref 35.0–45.0)
Hemoglobin: 12.3 g/dL (ref 11.7–15.5)
MCH: 23.3 pg — AB (ref 27.0–33.0)
MCHC: 31.6 g/dL — AB (ref 32.0–36.0)
MCV: 73.5 fL — ABNORMAL LOW (ref 80.0–100.0)
MPV: 9.5 fL (ref 7.5–12.5)
PLATELETS: 494 10*3/uL — AB (ref 140–400)
RBC: 5.29 MIL/uL — ABNORMAL HIGH (ref 3.80–5.10)
RDW: 17.4 % — ABNORMAL HIGH (ref 11.0–15.0)
WBC: 8.3 10*3/uL (ref 3.8–10.8)

## 2015-10-25 LAB — VARICELLA ZOSTER ANTIBODY, IGG: VARICELLA IGG: 1365 {index} — AB (ref <135.00)

## 2015-10-26 ENCOUNTER — Telehealth: Payer: Self-pay

## 2015-10-26 NOTE — Telephone Encounter (Signed)
Notified patient that her blood work looks normal per Dr. Delice Lesch.

## 2015-11-03 ENCOUNTER — Encounter: Payer: Self-pay | Admitting: Neurology

## 2015-11-03 NOTE — Telephone Encounter (Signed)
Pls let her know note is ready, thanks

## 2015-11-06 ENCOUNTER — Telehealth: Payer: Self-pay | Admitting: Neurology

## 2015-11-06 NOTE — Telephone Encounter (Signed)
Pt called and stated she got called last Friday from [place].  She couldn't answer her phone because [place] was [place].  Please call.

## 2015-11-08 ENCOUNTER — Other Ambulatory Visit: Payer: Self-pay | Admitting: Neurology

## 2015-11-08 NOTE — Telephone Encounter (Signed)
Patient notified

## 2015-11-09 NOTE — Telephone Encounter (Signed)
RX request for Flexeril 10mg  take 1 tab by mouth 3 times a day as needed with a historical provider. Last refill 09/05/15. Last OV 9/13 has appointment for 11/6. Okay to refill?

## 2015-11-09 NOTE — Telephone Encounter (Signed)
Ok to fill, thanks 

## 2015-11-09 NOTE — Telephone Encounter (Signed)
RX Flexeril 10mg  sent to pharmacy.

## 2015-11-13 ENCOUNTER — Ambulatory Visit (INDEPENDENT_AMBULATORY_CARE_PROVIDER_SITE_OTHER): Payer: Medicare Other | Admitting: Neurology

## 2015-11-13 ENCOUNTER — Encounter: Payer: Self-pay | Admitting: Neurology

## 2015-11-13 VITALS — BP 136/90 | HR 108 | Ht 64.0 in | Wt 250.3 lb

## 2015-11-13 DIAGNOSIS — F445 Conversion disorder with seizures or convulsions: Secondary | ICD-10-CM

## 2015-11-13 DIAGNOSIS — G43109 Migraine with aura, not intractable, without status migrainosus: Secondary | ICD-10-CM | POA: Diagnosis not present

## 2015-11-13 DIAGNOSIS — F411 Generalized anxiety disorder: Secondary | ICD-10-CM

## 2015-11-13 DIAGNOSIS — G5603 Carpal tunnel syndrome, bilateral upper limbs: Secondary | ICD-10-CM | POA: Diagnosis not present

## 2015-11-13 DIAGNOSIS — G35 Multiple sclerosis: Secondary | ICD-10-CM

## 2015-11-13 NOTE — Patient Instructions (Signed)
1. Continue all your medications 2. Referral to ophthalmologist will be sent, please let us know if you don't hear from them in a week or so 3. Proceed with [place] after eye doctor visit 4. Follow-up in 3 months

## 2015-11-13 NOTE — Progress Notes (Signed)
NEUROLOGY FOLLOW UP OFFICE NOTE  Emily [name] BP:[id number]  HISTORY OF PRESENT ILLNESS: I had the pleasure of seeing Emily [name] in follow-up in the neurology clinic on 11/13/2015.  The patient was last seen 2 months ago for several neurological conditions. She has a history of MS and stopped Gilenya during pregnancy. She is still awaiting ophthalmologist evaluation to restart Gilenya. On her last visit, she was restarted on migraine prophylactic medications, she is back on Depakote 500mg  BID and Topamax 50mg  BID without side effects. She has a migraine twice a week, last migraine was a week ago with associated vomiting. She has a mild headache today. She is reporting new symptoms of flashes/blinking lights in the periphery of her right eye, worse with the migraines. She has noticed new symptoms of tightness in her fingers, attention difficulties, and reduced genital sensation. She had several symptoms prior to pregnancy, that quieted down while pregnant, now back and affecting her on a daily basis. She continues to report numbness/tingling and burning in her extremities. She has a shock running down her back and rams and legs, from chin to chest. She has spasticity in her back. She has occasional vertigo. She is heat sensitive, with problems regulating heat and cold. She has occasional swallowing difficulties. She reports fatigue, depression, anxiety, and difficulty finding words. She has urinary urgency and frequency. She reports he left knee is stiff and buckles, no falls. Her daughter is now 15 months old and has a visiting nurse coming once a week. Her fiance works and mostly is only able to help her at night. Her mother comes to help her once a week. She reports anxiety level is high, she is frightened she would drop her baby if she had a stress seizure, and usually takes Diazepam which calms her down.   HPI 03/28/15: This is a pleasant 35 yo RH woman with a history of relapsing-remitting  multiple sclerosis, headaches, anxiety with panic attacks, conversion disorder with seizures. She presented at [redacted] weeks pregnant to determine if diazepam for anxiety/seizures would be a medication she can use, understanding this is pregnancy category D versus risks of injuring herself from falls when she has the psychogenic seizures. Records from her previous neurologists at [facility] were reviewed. She was diagnosed with MS in June 2008 while living in [place] state, she started having right thigh pain and right eye blurred vision. She was diagnosed with right optic neuritis and underwent MRI brain and lumbar puncture which confirmed a diagnosis of MS. 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 She has been on Gilenya since 2014 without any relapse. This was discontinued in December 2016 when she found out she was pregnant. She has a history of chronic pain and migraines, and was taking Depakote and Topamax for headache prophylaxis, also stopped in December 2016. She reports having Botox for her migraines, but so far migraines are not as bad as they were.  She started having shaking episodes in April 2016. Initially Depakote dose was increased to 500mg  BID. She was back in May 2016 with multiple episodes of syncope, palpitations, a sensation of her heart jumping out of her chest. She also reported lightheadedness, facial numbness, and numbness in her hands and feet. On her last visit in June 2016 at [facility], shaking episodes were discussed. Events always occur after a stressful event. She had a  spell in [place] where she had a normal EEG and was diagnosed with a conversion disorder. She had seen Dr. Darleene Cleaver and reportedly told her he could not help her because "her case was too complex." She started seeing a psychiatrist in [place], however since she switched  neurologists, she reports that he cannot see her anymore. She has not been able to establish care with [facility] and continues to have anxiety and panic attacks "all the time." She is diaphoretic in the office today and reports she always feels anxious. When she starts having one of her seizures, she would have significant anxiety, "my body can't deal and goes into a seizure," she hears her heartbeat in her ears. If it slowly builds up, she can get herself to a safe place, however other times it "just hits me," and she falls to the ground and has injured herself. She reports falling out one time in the bathroom during a doctor visit. She is currently on Fluoxetine and Buspirone. Clonazepam in the past did not help. She would usually go to the ER for these spells, which would quiet down once she gets Benadryl and Diazepam.  She delivered early at [redacted] weeks gestation last July 12, 2015. A week after delivery, she started having numbness on the top of both feet, pain in the balls of her feet, with numbness traveling up to her mid-back. She also has numbness in both forearms, and reports her palms feel the same as her soles, with constant stabbing burning pain. She had these symptoms pre-pregnancy but states the numbness only went up to her knees. During pregnancy, all her symptoms were less bothersome. She has been following with Pain management in [facility] and would occasionally take Oxycodone prior to her pregnancy.  Diagnostic Data:  I personally reviewed MRI brain with and without contrast done with [facility] last 10/02/13 which did not show any acute changes, there was multiple T2/FLAIR lesions in the subcortical and deep white matter regions, no abnormal enhancement. Hippocampi appear asymmetric, smaller on the right without abnormal signal or enhancement.  MRI C-spine with and without contrast 10/18/13 showed hazy T2 hyperintensity within the cord from C2-3 to C4-5 without abnormal  enhancement.  per [facility] - MRI brain done 03/18/14: "No change in numerous multifocal T2 hyperintense white matter lesions relative to prior MRI from 04/17/2013 most consistent with demyelinating lesions given history of multiple sclerosis. No evidence of active demyelination."  MRI cervical spine done 03/18/14: "Relative to prior MRI from 10/20/2009, no change in patchy foci of increased T2 signal in the dorsal cord at C2- C4 in keeping with demyelinating lesions in this patient with a clinical history of multiple sclerosis.  No evidence of enhancement to suggest active demyelination. Moderately advanced degenerative disease at C5-C6 and C6-C7 with interval development of left eccentric central, foraminal entry zone and foraminal disc extrusion at C6-C7 exerting mass effect on the exiting C7 and descending C8 roots.  MRI brain and C-spine 07/30/2015 with and without contrast : There was no abnormal enhancement. There were no changes from previous MRI brain in 09/2013 with multiple bilateral subcortical greater than 10 periventricular T2 hyperintensities. There were no cord lesions seen up to T3-4. There was chronic degenerative disc disease and central canal stenosis at C5-6 and C6-7, no abnormal cord signal, unchanged from 06/2014.  EMG/NCV done on both UE and right LE showed bilateral carpal tunnel syndrome, mild to moderate in degree, no evidence of polyneuropathy or radiculopathy on the right side.  PAST MEDICAL HISTORY: Past Medical History:  Diagnosis Date  . Anxiety   . Asthma as a child  . Chronic back pain   . Chronic pain   . Conversion disorder   . Depression   . GERD (gastroesophageal reflux disease)   . Headache(784.0)    migraines  . HTN (hypertension) 02/27/2015  . JC virus antibody positive   . Migraines   . MS (multiple sclerosis) ([place])    06-2006  . Multiple sclerosis ([place])   . Ovarian cyst   . Panic attack   . Perforated bowel ([place]) 2009  . Pseudoseizures   . PTSD  (post-traumatic stress disorder)   . S/P emergency C-section   . Urinary urgency     MEDICATIONS: Current Outpatient Prescriptions on File Prior to Visit  Medication Sig Dispense Refill  . Acetaminophen-Codeine (TYLENOL/CODEINE #3) 300-30 MG tablet Take 1 tablet by mouth every 4 (four) hours as needed for pain.    Marland Kitchen albuterol (PROVENTIL HFA;VENTOLIN HFA) 108 (90 Base) MCG/ACT inhaler Inhale 2 puffs into the lungs every 6 (six) hours as needed for wheezing or shortness of breath. 1 Inhaler 0  . busPIRone (BUSPAR) 10 MG tablet Take 1 tablet (10 mg total) by mouth 3 (three) times daily. 90 tablet 3  . cyclobenzaprine (FLEXERIL) 10 MG tablet TAKE 1 TABLET BY MOUTH 3 TIMES A DAY AS NEEDED FOR MUSCLE SPASMS 30 tablet 0  . diazepam (VALIUM) 5 MG tablet Take 1 tablet (5 mg total) by mouth daily. Take 1 tablet as needed for panic attacks leading to seizures. 30 tablet 3  . diphenhydrAMINE (SOMINEX) 25 MG tablet Take 25 mg by mouth at bedtime as needed for sleep.    . divalproex (DEPAKOTE) 500 MG DR tablet Take 1 tablet at night for 1 week, then increase to 1 tablet twice a day 60 tablet 6  . EPINEPHrine (EPIPEN) 0.3 mg/0.3 mL SOAJ injection Inject 0.3 mg into the muscle as needed (allergic reaction). Reported on 03/28/2015    . FLUoxetine (PROZAC) 20 MG tablet Take 2 tablets (40 mg total) by mouth daily. 60 tablet 3  . hydrochlorothiazide (HYDRODIURIL) 25 MG tablet Take 1 tablet (25 mg total) by mouth daily. 30 tablet 1  . ibuprofen (ADVIL,MOTRIN) 600 MG tablet Take 1 tablet (600 mg total) by mouth every 6 (six) hours as needed for moderate pain. Do not take on an empty stomach. 60 tablet 0  . Multiple Vitamins-Minerals (MULTIVITAMIN ADULT PO) Take 1 tablet by mouth daily.     . promethazine (PHENERGAN) 25 MG tablet Take 0.5-1 tablets (12.5-25 mg total) by mouth every 6 (six) hours as needed for nausea. 30 tablet 0  . ranitidine (ZANTAC) 300 MG capsule Take 300 mg by mouth at bedtime. Reported on  07/21/2015    . simethicone (MYLICON) 80 MG chewable tablet Chew 1 tablet (80 mg total) by mouth 4 (four) times daily as needed for flatulence. 30 tablet 0  . SUMAtriptan (IMITREX) 100 MG tablet Take 1 tablet (100 mg total) by mouth every 2 (two) hours as needed for migraine. May repeat in 2 hours if headache persists or recurs. 9 tablet 6  . topiramate (TOPAMAX) 50 MG tablet Take 1/2 tablet twice a day for 1 week, then increase to 1 tablet twice a day 60 tablet 6   Current Facility-Administered Medications on File Prior to Visit  Medication Dose Route Frequency Provider Last Rate Last Dose  . dexamethasone (DECADRON) injection 10 mg  10 mg Intravenous Once Broadus John  Zammit, MD      . metoCLOPramide (REGLAN) injection 10 mg  10 mg Intravenous Once Milton Ferguson, MD        ALLERGIES: Allergies  Allergen Reactions  . Amitriptyline Hypertension and Other (See Comments)    hypertension  . Baclofen Hives and Shortness Of Breath  . Cymbalta [Duloxetine Hcl] Shortness Of Breath and Rash  . Gabapentin Shortness Of Breath and Rash  . Monosodium Glutamate Anaphylaxis  . Other Shortness Of Breath and Rash    MSG, beans (vomiting)  . Vicodin [Hydrocodone-Acetaminophen] Hives and Nausea And Vomiting    Projectile vomiting  . Alprazolam Other (See Comments)    Lethargy  . Magnesium Salicylate Hives and Itching  . Rizatriptan Nausea And Vomiting and Other (See Comments)    GI upset, Projectile vomiting  . Tizanidine Hives  . Tramadol Other (See Comments)    Unspecified  . Adhesive [Tape] Other (See Comments)    Skin irritation Paper tape is ok  . Lamotrigine Rash  . Toradol [Ketorolac Tromethamine] Nausea Only    FAMILY HISTORY: Family History  Problem Relation Age of Onset  . Diabetes Mother   . Hypertension Mother   . Diabetes Father   . Hypertension Father   . Arthritis Father   . Cancer Maternal Grandmother   . Cancer Maternal Grandfather   . Alcohol abuse Neg Hx   . Anxiety  disorder Neg Hx   . Bipolar disorder Neg Hx   . Drug abuse Neg Hx   . Depression Neg Hx     SOCIAL HISTORY: Social History   Social History  . Marital status: Single    Spouse name: N/A  . Number of children: 0  . Years of education: College   Occupational History  .  Other    disability   Social History Main Topics  . Smoking status: Light Tobacco Smoker    Packs/day: 0.25    Years: 10.00    Types: Cigarettes  . Smokeless tobacco: Never Used  . Alcohol use No     Comment: Rare  . Drug use: No  . Sexual activity: Yes    Birth control/ protection: None   Other Topics Concern  . Not on file   Social History Narrative   Patient lives at home with fiance in [place]. Born and raised in [place], [place] by parents. Pt has one younger sister. Pt has a Best boy. Pt worked from 1999-2006. She stopped due to MS and is currently on disability. Pt is pregnant with her 1st child. Married for less than 1 yr in 1999 that ended in divorce.             Caffeine Use: 1 20oz soda daily    REVIEW OF SYSTEMS: Constitutional: No fevers, chills, or sweats, + generalized fatigue, no change in appetite Eyes: as above, no double vision, eye pain Ear, nose and throat: No hearing loss, ear pain, nasal congestion, sore throat Cardiovascular: No chest pain, palpitations Respiratory:  No shortness of breath at rest or with exertion, wheezes GastrointestinaI: No nausea, vomiting, diarrhea, abdominal pain, fecal incontinence Genitourinary:  No dysuria, urinary retention, +urinary frequency Musculoskeletal:  + neck pain, back pain Integumentary: No rash, pruritus, skin lesions Neurological: as above Psychiatric: + depression, anxiety Endocrine: No palpitations, fatigue, diaphoresis, mood swings, change in appetite, change in weight, increased thirst Hematologic/Lymphatic:  No anemia, purpura, petechiae. Allergic/Immunologic: no itchy/runny eyes, nasal congestion, recent  allergic reactions, rashes  PHYSICAL EXAM: Vitals:  11/13/15 1344  BP: 136/90  Pulse: (!) 108   General: No acute distress Head: Normocephalic/atraumatic Eyes: Fundoscopic exam shows bilateral sharp discs, no vessel changes, exudates, or hemorrhages Neck: supple, no paraspinal tenderness, full range of motion Back: No paraspinal tenderness Heart: regular rate and rhythm Lungs: Clear to auscultation bilaterally. Vascular: No carotid bruits. Skin/Extremities: No rash, no edema Neurological Exam: Mental status: alert and oriented to person, place, and time, no dysarthria or aphasia, Fund of knowledge is appropriate. Recent and remote memory are intact. Attention and concentration are normal. Able to name objects and repeat phrases. Cranial nerves: CN I: not tested CN II: pupils equal, round and reactive to light,no APD, visual fields intact, fundi unremarkable. CN III, IV, VI: full range of motion, no nystagmus, no ptosis CN V: facial sensation intact CN VII: upper and lower face symmetric CN VIII: hearing intact to finger rub CN IX, X: gag intact, uvula midline CN XI: sternocleidomastoid and trapezius muscles intact CN XII: tongue midline Bulk & Tone: normal, no fasciculations. Motor: 5/5 throughout with no pronator drift. Sensation: intact to light touch Deep Tendon Reflexes: +2 throughout, no ankle clonus Plantar responses: downgoing bilaterally Cerebellar: no incoordination on finger to nose testing Gait: slow and cautious due to left knee stiffness Tremor: none  IMPRESSION: This is a 35 yo RH woman with a history of relapsing-remitting multiple sclerosis, migraine with aura, anxiety with panic attacks, conversion disorder with psychogenic seizures. Her baby is now 3 months old, and she continues to report recurrence of symptoms she was having pre-pregnancy as noted above. She is back on Depakote 500mg  BID and Topamax 50mg  BID for migraine prophylaxis. She is awaiting  ophthalmology appointment prior to restarting Gilenya, she will also see them for the new symptoms of flashing lights on her right peripheral vision. She continues to work with her psychiatrist for the anxiety. Continue bilateral wrist splints for carpal tunnel syndrome. She does not drive. She had previously been receiving CAPS prior to her pregnancy, and now with recurrence of pre-pregnancy symptoms, she would like to resume this. Forms filled out today in support of this request. She will follow-up in 3 months and knows to call for any changes.   Thank you for allowing me to participate in her care.  Please do not hesitate to call for any questions or concerns.  The duration of this appointment visit was 25 minutes of face-to-face time with the patient.  Greater than 50% of this time was spent in counseling, explanation of diagnosis, planning of further management, and coordination of care.   Ellouise Newer, M.D.   CC: [facility]

## 2015-11-21 DIAGNOSIS — G35 Multiple sclerosis: Secondary | ICD-10-CM | POA: Diagnosis not present

## 2015-11-21 DIAGNOSIS — H469 Unspecified optic neuritis: Secondary | ICD-10-CM | POA: Diagnosis not present

## 2015-11-21 DIAGNOSIS — H179 Unspecified corneal scar and opacity: Secondary | ICD-10-CM | POA: Diagnosis not present

## 2015-11-21 DIAGNOSIS — H47291 Other optic atrophy, right eye: Secondary | ICD-10-CM | POA: Diagnosis not present

## 2015-11-23 ENCOUNTER — Ambulatory Visit (INDEPENDENT_AMBULATORY_CARE_PROVIDER_SITE_OTHER): Payer: Medicare Other | Admitting: Psychiatry

## 2015-11-23 ENCOUNTER — Encounter (HOSPITAL_COMMUNITY): Payer: Self-pay | Admitting: Psychiatry

## 2015-11-23 DIAGNOSIS — F411 Generalized anxiety disorder: Secondary | ICD-10-CM

## 2015-11-23 DIAGNOSIS — F332 Major depressive disorder, recurrent severe without psychotic features: Secondary | ICD-10-CM

## 2015-11-23 DIAGNOSIS — Z8489 Family history of other specified conditions: Secondary | ICD-10-CM

## 2015-11-23 DIAGNOSIS — F17210 Nicotine dependence, cigarettes, uncomplicated: Secondary | ICD-10-CM

## 2015-11-23 DIAGNOSIS — F445 Conversion disorder with seizures or convulsions: Secondary | ICD-10-CM | POA: Diagnosis not present

## 2015-11-23 DIAGNOSIS — Z79899 Other long term (current) drug therapy: Secondary | ICD-10-CM

## 2015-11-23 DIAGNOSIS — F4001 Agoraphobia with panic disorder: Secondary | ICD-10-CM

## 2015-11-23 DIAGNOSIS — Z8261 Family history of arthritis: Secondary | ICD-10-CM

## 2015-11-23 DIAGNOSIS — Z8249 Family history of ischemic heart disease and other diseases of the circulatory system: Secondary | ICD-10-CM

## 2015-11-23 DIAGNOSIS — Z833 Family history of diabetes mellitus: Secondary | ICD-10-CM

## 2015-11-23 DIAGNOSIS — Z888 Allergy status to other drugs, medicaments and biological substances status: Secondary | ICD-10-CM

## 2015-11-23 MED ORDER — BUSPIRONE HCL 15 MG PO TABS: 15.0000 mg | ORAL_TABLET | Freq: Three times a day (TID) | ORAL | 3 refills | Status: DC

## 2015-11-23 MED ORDER — DIAZEPAM 5 MG PO TABS: 5.0000 mg | ORAL_TABLET | Freq: Every day | ORAL | 3 refills | Status: DC

## 2015-11-23 MED ORDER — FLUOXETINE HCL 60 MG PO TABS: 60.0000 mg | ORAL_TABLET | Freq: Every day | ORAL | 3 refills | Status: DC
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
## 2015-11-27 ENCOUNTER — Other Ambulatory Visit (HOSPITAL_COMMUNITY): Payer: Self-pay | Admitting: Psychiatry

## 2015-11-27 DIAGNOSIS — F445 Conversion disorder with seizures or convulsions: Secondary | ICD-10-CM

## 2015-11-27 DIAGNOSIS — F411 Generalized anxiety disorder: Secondary | ICD-10-CM

## 2015-11-27 DIAGNOSIS — F332 Major depressive disorder, recurrent severe without psychotic features: Secondary | ICD-10-CM

## 2015-12-04 DIAGNOSIS — J302 Other seasonal allergic rhinitis: Secondary | ICD-10-CM | POA: Diagnosis not present

## 2015-12-04 DIAGNOSIS — I10 Essential (primary) hypertension: Secondary | ICD-10-CM | POA: Diagnosis not present

## 2015-12-04 DIAGNOSIS — K219 Gastro-esophageal reflux disease without esophagitis: Secondary | ICD-10-CM | POA: Diagnosis not present

## 2015-12-04 DIAGNOSIS — M5408 Panniculitis affecting regions of neck and back, sacral and sacrococcygeal region: Secondary | ICD-10-CM | POA: Diagnosis not present

## 2015-12-18 DIAGNOSIS — G35 Multiple sclerosis: Secondary | ICD-10-CM | POA: Diagnosis not present

## 2015-12-18 DIAGNOSIS — R569 Unspecified convulsions: Secondary | ICD-10-CM | POA: Diagnosis not present

## 2015-12-18 DIAGNOSIS — Z131 Encounter for screening for diabetes mellitus: Secondary | ICD-10-CM | POA: Diagnosis not present

## 2015-12-18 DIAGNOSIS — Z13220 Encounter for screening for lipoid disorders: Secondary | ICD-10-CM | POA: Diagnosis not present

## 2015-12-18 DIAGNOSIS — M25519 Pain in unspecified shoulder: Secondary | ICD-10-CM | POA: Diagnosis not present

## 2015-12-18 DIAGNOSIS — G43909 Migraine, unspecified, not intractable, without status migrainosus: Secondary | ICD-10-CM | POA: Diagnosis not present

## 2015-12-18 DIAGNOSIS — I10 Essential (primary) hypertension: Secondary | ICD-10-CM | POA: Diagnosis not present

## 2015-12-21 ENCOUNTER — Telehealth: Payer: Self-pay | Admitting: Neurology

## 2015-12-21 NOTE — Telephone Encounter (Signed)
Emily [name] 23-May-1980. Her # [phone] or [phone] lmom. She had a question regarding Botox. Thank you

## 2015-12-22 MED ORDER — METOCLOPRAMIDE HCL 10 MG PO TABS: ORAL_TABLET | ORAL | 0 refills | Status: DC

## 2015-12-22 NOTE — Telephone Encounter (Signed)
There is an anti-nausea medicine called Reglan which can sometimes also help as rescue for when people have migraines. Has she tried this or does she want to try it? Only take as needed, 2-3 times a week. Reglan 10mg , dispense #10. Thanks

## 2015-12-22 NOTE — Telephone Encounter (Signed)
Notified patient we will resubmit her information to try and have Botox approved at first of the year. Patient states she is taking all medications as prescribed and wants to know if there is anything else recommended for when she gets these bad headaches. She states sometimes they are so bad she becomes nauseous.

## 2015-12-22 NOTE — Telephone Encounter (Signed)
Contacted patient. She states she has used Reglan before and it seemed to work. Sent RX to pharmacy.

## 2015-12-26 ENCOUNTER — Ambulatory Visit: Payer: Self-pay | Admitting: Neurology

## 2016-01-02 ENCOUNTER — Telehealth: Payer: Self-pay | Admitting: Neurology

## 2016-01-02 NOTE — Telephone Encounter (Signed)
Contacted [facility] and they could not find patient in system so they asked we do fax her information. Notified patient we would fax a new form. Asked that she come by office one day this week to sign form. Notified her we would start Botox approvals the first of the year.

## 2016-01-02 NOTE — Telephone Encounter (Signed)
Emily [name] 10-15-1980. She was calling regarding New Enrollment forms that she needs  faxed to [facility] 1 [phone]. Dr. Delice Lesch was wanting to see if she could do the program with them to help pay for it and she would get the treatments.  Something has happened with their system and it does not show her anymore. In November that had issues with their system and lost a lot of their patient's information.  She also was wondering about the Botox. Her # [phone]. You can lmom . Thank you

## 2016-01-04 DIAGNOSIS — Z79899 Other long term (current) drug therapy: Secondary | ICD-10-CM | POA: Diagnosis not present

## 2016-01-04 DIAGNOSIS — K0889 Other specified disorders of teeth and supporting structures: Secondary | ICD-10-CM | POA: Diagnosis not present

## 2016-01-04 DIAGNOSIS — G35 Multiple sclerosis: Secondary | ICD-10-CM | POA: Diagnosis not present

## 2016-01-07 ENCOUNTER — Encounter (HOSPITAL_COMMUNITY): Payer: Self-pay | Admitting: *Deleted

## 2016-01-07 ENCOUNTER — Emergency Department (HOSPITAL_COMMUNITY)
Admission: EM | Admit: 2016-01-07 | Discharge: 2016-01-07 | Disposition: A | Discharge status: Home / Self Care | Payer: Medicare Other | Attending: Emergency Medicine | Admitting: Emergency Medicine

## 2016-01-07 DIAGNOSIS — J45909 Unspecified asthma, uncomplicated: Secondary | ICD-10-CM | POA: Diagnosis not present

## 2016-01-07 DIAGNOSIS — K0889 Other specified disorders of teeth and supporting structures: Secondary | ICD-10-CM | POA: Diagnosis not present

## 2016-01-07 DIAGNOSIS — Z87891 Personal history of nicotine dependence: Secondary | ICD-10-CM | POA: Diagnosis not present

## 2016-01-07 DIAGNOSIS — I10 Essential (primary) hypertension: Secondary | ICD-10-CM | POA: Insufficient documentation

## 2016-01-07 MED ORDER — BUPIVACAINE-EPINEPHRINE (PF) 0.25% -1:200000 IJ SOLN
10.0000 mL | Freq: Once | INTRAMUSCULAR | Status: AC
  Administered 2016-01-07: 10 mL
  Filled 2016-01-07: qty 30

## 2016-01-07 NOTE — ED Triage Notes (Signed)
Pt reports already being seen at [facility] hospital for right side dental pain, having increase in pain despite taking antibiotics.

## 2016-01-07 NOTE — ED Notes (Signed)
Pt is getting dressed and ready for discharge

## 2016-01-07 NOTE — ED Provider Notes (Signed)
[facility] DEPT Provider Note   CSN: [id number] Arrival date & time: 01/07/16  [id number]     History   Chief Complaint Chief Complaint  Patient presents with  . Dental Pain    HPI Emily [name] is a 35 y.o. female.  Patient is a 35 year old female with a history of depression, conversion disorder, multiple sclerosis and chronic migraines who presents with dental pain. She reports a one-week history of pain to her right upper tooth. She hasn't been able to get in to see her dentist because of this is been closed over the holidays. She went to an emergency department in [place] and was started on amoxicillin which she currently is still taking. She denies any facial swelling. She reports an elevated temperature to 99. She denies any nausea or vomiting.  No difficulty swallowing.      Past Medical History:  Diagnosis Date  . Anxiety   . Asthma as a child  . Chronic back pain   . Chronic pain   . Conversion disorder   . Depression   . GERD (gastroesophageal reflux disease)   . Headache(784.0)    migraines  . HTN (hypertension) 02/27/2015  . JC virus antibody positive   . Migraines   . MS (multiple sclerosis) ([place])    06-2006  . Multiple sclerosis ([place])   . Ovarian cyst   . Panic attack   . Perforated bowel ([place]) 2009  . Pseudoseizures   . PTSD (post-traumatic stress disorder)   . S/P emergency C-section   . Urinary urgency     Patient Active Problem List   Diagnosis Date Noted  . Bilateral carpal tunnel syndrome 11/13/2015  . Substance abuse 09/07/2015  . Paresthesia 08/02/2015  . Chronic pain syndrome 08/02/2015  . Pain, postoperative, acute 07/21/2015  . Postpartum endometritis 07/21/2015  . PTSD (post-traumatic stress disorder) 05/16/2015  . Conversion disorder with seizures or convulsions 05/16/2015  . Severe episode of recurrent major depressive disorder, without psychotic features ([place]) 05/16/2015  . GAD (generalized anxiety disorder) 05/16/2015  .  Difficult intravenous access 05/08/2015  . Right optic neuritis 04/27/2015  . Conversion disorder with attacks or seizures, persistent, with psychological stressor 03/28/2015  . Migraine with aura and without status migrainosus, not intractable 03/28/2015  . Generalized anxiety disorder 03/28/2015  . HTN (hypertension) 02/27/2015  . Perforated bowel ([place]) 02/27/2015  . Depression 12/27/2014  . Multiple sclerosis ([place]) 12/27/2014  . Migraine 12/27/2014  . Seizure disorder ([place]) 12/27/2014  . Smoker 12/27/2014  . Obesity 12/27/2014    Past Surgical History:  Procedure Laterality Date  . ABDOMINAL SURGERY    . APPENDECTOMY    . BOWEL RESECTION  01/2007   with colostomy  . CESAREAN SECTION N/A 07/12/2015   Procedure: CESAREAN SECTION;  Surgeon: Guss Bunde, MD;  Location: [facility];  Service: Obstetrics;  Laterality: N/A;  . COLOSTOMY CLOSURE  04/2007  . EXTREMITY CYST EXCISION  1994   right leg  . HERNIA REPAIR    . SCAR REVISION  01/21/2011   Procedure: SCAR REVISION;  Surgeon: Hermelinda Dellen;  Location: [facility];  Service: Plastics;  Laterality: N/A;  exploration of scar of abdomen and repair of defect    OB History    Gravida Para Term Preterm AB Living   1 1   1   1    SAB TAB Ectopic Multiple Live Births         0 1  Home Medications    Prior to Admission medications   Medication Sig Start Date End Date Taking? Authorizing Provider  Acetaminophen-Codeine (TYLENOL/CODEINE #3) 300-30 MG tablet Take 1 tablet by mouth every 4 (four) hours as needed for pain.    Historical Provider, MD  albuterol (PROVENTIL HFA;VENTOLIN HFA) 108 (90 Base) MCG/ACT inhaler Inhale 2 puffs into the lungs every 6 (six) hours as needed for wheezing or shortness of breath. 02/15/15   Paticia Stack, PA-C  busPIRone (BUSPAR) 15 MG tablet Take 1 tablet (15 mg total) by mouth 3 (three) times daily. 11/23/15   Charlcie Cradle, MD  cyclobenzaprine (FLEXERIL) 10 MG  tablet TAKE 1 TABLET BY MOUTH 3 TIMES A DAY AS NEEDED FOR MUSCLE SPASMS 11/09/15   Cameron Sprang, MD  diazepam (VALIUM) 5 MG tablet Take 1 tablet (5 mg total) by mouth daily. Take 1 tablet as needed for panic attacks leading to seizures. 11/23/15   Charlcie Cradle, MD  diphenhydrAMINE (SOMINEX) 25 MG tablet Take 25 mg by mouth at bedtime as needed for sleep.    Historical Provider, MD  divalproex (DEPAKOTE) 500 MG DR tablet Take 1 tablet at night for 1 week, then increase to 1 tablet twice a day 09/20/15   Cameron Sprang, MD  EPINEPHrine (EPIPEN) 0.3 mg/0.3 mL SOAJ injection Inject 0.3 mg into the muscle as needed (allergic reaction). Reported on 03/28/2015    Historical Provider, MD  FLUoxetine 60 MG TABS Take 60 mg by mouth daily. 11/23/15   Charlcie Cradle, MD  hydrochlorothiazide (HYDRODIURIL) 25 MG tablet Take 1 tablet (25 mg total) by mouth daily. 07/15/15   Aletha Halim, MD  ibuprofen (ADVIL,MOTRIN) 600 MG tablet Take 1 tablet (600 mg total) by mouth every 6 (six) hours as needed for moderate pain. Do not take on an empty stomach. 07/21/15   Lauralyn Primes [place], DO  metoCLOPramide (REGLAN) 10 MG tablet Take 1 tablet as needed 2-3 times a week. 12/22/15   Cameron Sprang, MD  Multiple Vitamins-Minerals (MULTIVITAMIN ADULT PO) Take 1 tablet by mouth daily.     Historical Provider, MD  promethazine (PHENERGAN) 25 MG tablet Take 0.5-1 tablets (12.5-25 mg total) by mouth every 6 (six) hours as needed for nausea. 07/10/15   Gwynne Edinger, MD  ranitidine (ZANTAC) 300 MG capsule Take 300 mg by mouth at bedtime. Reported on 07/21/2015    Historical Provider, MD  simethicone (MYLICON) 80 MG chewable tablet Chew 1 tablet (80 mg total) by mouth 4 (four) times daily as needed for flatulence. 07/15/15   Aletha Halim, MD  SUMAtriptan (IMITREX) 100 MG tablet Take 1 tablet (100 mg total) by mouth every 2 (two) hours as needed for migraine. May repeat in 2 hours if headache persists or recurs. 10/20/15   Cameron Sprang, MD  topiramate (TOPAMAX) 50 MG tablet Take 1/2 tablet twice a day for 1 week, then increase to 1 tablet twice a day 09/20/15   Cameron Sprang, MD    Family History Family History  Problem Relation Age of Onset  . Diabetes Mother   . Hypertension Mother   . Diabetes Father   . Hypertension Father   . Arthritis Father   . Cancer Maternal Grandmother   . Cancer Maternal Grandfather   . Alcohol abuse Neg Hx   . Anxiety disorder Neg Hx   . Bipolar disorder Neg Hx   . Drug abuse Neg Hx   . Depression Neg Hx     Social  History Social History  Substance Use Topics  . Smoking status: Former Smoker    Packs/day: 0.25    Years: 10.00    Types: Cigarettes    Quit date: 07/12/2015  . Smokeless tobacco: Never Used  . Alcohol use No     Comment: Rare     Allergies   Amitriptyline; Baclofen; Cymbalta [duloxetine hcl]; Gabapentin; Monosodium glutamate; Other; Vicodin [hydrocodone-acetaminophen]; Alprazolam; Magnesium salicylate; Rizatriptan; Tizanidine; Tramadol; Adhesive [tape]; Lamotrigine; and Toradol [ketorolac tromethamine]   Review of Systems Review of Systems  Constitutional: Negative for fever.  HENT: Positive for dental problem.   Gastrointestinal: Negative for nausea and vomiting.  Musculoskeletal: Negative for arthralgias, joint swelling and neck pain.  Skin: Negative for wound.  Neurological: Negative for weakness, numbness and headaches.     Physical Exam Updated Vital Signs BP 134/90   Pulse 106   Temp 98.5 F (36.9 C) (Oral)   Resp 16   LMP 12/08/2015   SpO2 99%   Physical Exam  Constitutional: She is oriented to person, place, and time.  HENT:  Tenderness over the right upper bicuspid. There is no swelling. No erythema. No trismus. Uvula is midline. No drainage.  Neck: Normal range of motion. Neck supple.  Cardiovascular: Normal rate.   Pulmonary/Chest: Effort normal.  Neurological: She is alert and oriented to person, place, and time.  Skin:  Skin is warm and dry.  Psychiatric: She has a normal mood and affect.     ED Treatments / Results  Labs (all labs ordered are listed, but only abnormal results are displayed) Labs Reviewed - No data to display  EKG  EKG Interpretation None       Radiology No results found.  Procedures .Nerve Block Date/Time: 01/07/2016 10:24 AM Performed by: Arlen Dupuis Authorized by: Malvin Johns   Consent:    Consent obtained:  Verbal   Consent given by:  Patient   Risks discussed:  Pain   Alternatives discussed:  No treatment Indications:    Indications:  Pain relief Location:    Nerve block body site: dental.   Laterality:  Right Procedure details (see MAR for exact dosages):    Block needle gauge:  27 G   Anesthetic injected:  Bupivacaine 0.25% w/o epi   Steroid injected:  None   Additive injected:  None   Injection procedure:  Anatomic landmarks identified and negative aspiration for blood Post-procedure details:    Dressing:  None   Outcome:  Pain relieved   Patient tolerance of procedure:  Tolerated well, no immediate complications   (including critical care time)  Medications Ordered in ED Medications  bupivacaine-epinephrine (MARCAINE W/ EPI) 0.25% -1:200000 injection 10 mL (10 mLs Infiltration Given by Other 01/07/16 1022)     Initial Impression / Assessment and Plan / ED Course  I have reviewed the triage vital signs and the nursing notes.  Pertinent labs & imaging results that were available during my care of the patient were reviewed by me and considered in my medical decision making (see chart for details).  Clinical Course     Patient with dental pain. There is no evidence of infection. A dental block was performed by me. She had immediate pain relief. She was feeling much better and was discharged home. She's going to attempt follow-up with her dentist wants the office reopens after the new year. Her heart rate has improved to 100. Her blood  pressure still elevated and I advised her to have this rechecked by her PCP.  Final Clinical Impressions(s) / ED Diagnoses   Final diagnoses:  Pain, dental    New Prescriptions New Prescriptions   No medications on file     Malvin Johns, MD 01/07/16 1026

## 2016-01-19 ENCOUNTER — Other Ambulatory Visit: Payer: Self-pay | Admitting: Neurology

## 2016-01-23 DIAGNOSIS — I10 Essential (primary) hypertension: Secondary | ICD-10-CM | POA: Diagnosis not present

## 2016-01-23 DIAGNOSIS — F419 Anxiety disorder, unspecified: Secondary | ICD-10-CM | POA: Diagnosis not present

## 2016-01-25 ENCOUNTER — Other Ambulatory Visit (HOSPITAL_COMMUNITY): Payer: Self-pay | Admitting: Psychiatry

## 2016-01-25 DIAGNOSIS — F332 Major depressive disorder, recurrent severe without psychotic features: Secondary | ICD-10-CM

## 2016-01-25 DIAGNOSIS — F411 Generalized anxiety disorder: Secondary | ICD-10-CM

## 2016-01-25 DIAGNOSIS — F445 Conversion disorder with seizures or convulsions: Secondary | ICD-10-CM

## 2016-02-01 DIAGNOSIS — I10 Essential (primary) hypertension: Secondary | ICD-10-CM | POA: Diagnosis not present

## 2016-02-01 DIAGNOSIS — K089 Disorder of teeth and supporting structures, unspecified: Secondary | ICD-10-CM | POA: Diagnosis not present

## 2016-02-03 ENCOUNTER — Emergency Department (HOSPITAL_COMMUNITY): Payer: Medicare Other

## 2016-02-03 ENCOUNTER — Encounter (HOSPITAL_COMMUNITY): Payer: Self-pay

## 2016-02-03 ENCOUNTER — Emergency Department (HOSPITAL_COMMUNITY)
Admission: EM | Admit: 2016-02-03 | Discharge: 2016-02-03 | Disposition: A | Discharge status: Home / Self Care | Payer: Medicare Other | Attending: Emergency Medicine | Admitting: Emergency Medicine

## 2016-02-03 DIAGNOSIS — R1030 Lower abdominal pain, unspecified: Secondary | ICD-10-CM | POA: Diagnosis not present

## 2016-02-03 DIAGNOSIS — Z87891 Personal history of nicotine dependence: Secondary | ICD-10-CM | POA: Insufficient documentation

## 2016-02-03 DIAGNOSIS — Z79899 Other long term (current) drug therapy: Secondary | ICD-10-CM | POA: Diagnosis not present

## 2016-02-03 DIAGNOSIS — I10 Essential (primary) hypertension: Secondary | ICD-10-CM | POA: Diagnosis not present

## 2016-02-03 DIAGNOSIS — J45909 Unspecified asthma, uncomplicated: Secondary | ICD-10-CM | POA: Insufficient documentation

## 2016-02-03 DIAGNOSIS — K439 Ventral hernia without obstruction or gangrene: Secondary | ICD-10-CM | POA: Diagnosis not present

## 2016-02-03 LAB — CBC WITH DIFFERENTIAL/PLATELET
Basophils Absolute: 0 10*3/uL (ref 0.0–0.1)
Basophils Relative: 0 %
EOS ABS: 0.1 10*3/uL (ref 0.0–0.7)
EOS PCT: 1 %
HCT: 43.6 % (ref 36.0–46.0)
Hemoglobin: 14.6 g/dL (ref 12.0–15.0)
LYMPHS ABS: 2.4 10*3/uL (ref 0.7–4.0)
Lymphocytes Relative: 27 %
MCH: 27.3 pg (ref 26.0–34.0)
MCHC: 33.5 g/dL (ref 30.0–36.0)
MCV: 81.5 fL (ref 78.0–100.0)
Monocytes Absolute: 0.4 10*3/uL (ref 0.1–1.0)
Monocytes Relative: 5 %
Neutro Abs: 6 10*3/uL (ref 1.7–7.7)
Neutrophils Relative %: 67 %
PLATELETS: 388 10*3/uL (ref 150–400)
RBC: 5.35 MIL/uL — AB (ref 3.87–5.11)
RDW: 15.6 % — AB (ref 11.5–15.5)
WBC: 8.9 10*3/uL (ref 4.0–10.5)

## 2016-02-03 LAB — PREGNANCY, URINE: Preg Test, Ur: NEGATIVE

## 2016-02-03 LAB — URINALYSIS, ROUTINE W REFLEX MICROSCOPIC
BILIRUBIN URINE: NEGATIVE
Glucose, UA: NEGATIVE mg/dL
HGB URINE DIPSTICK: NEGATIVE
KETONES UR: NEGATIVE mg/dL
Leukocytes, UA: NEGATIVE
Nitrite: NEGATIVE
PROTEIN: NEGATIVE mg/dL
Specific Gravity, Urine: 1.014 (ref 1.005–1.030)
pH: 5 (ref 5.0–8.0)

## 2016-02-03 MED ORDER — OXYCODONE-ACETAMINOPHEN 5-325 MG PO TABS
1.0000 | ORAL_TABLET | Freq: Once | ORAL | Status: AC
  Administered 2016-02-03: 1 via ORAL
  Filled 2016-02-03: qty 1

## 2016-02-03 MED ORDER — IOPAMIDOL (ISOVUE-300) INJECTION 61%
INTRAVENOUS | Status: AC
  Administered 2016-02-03: 100 mL via INTRAVENOUS
  Filled 2016-02-03: qty 100

## 2016-02-03 NOTE — ED Triage Notes (Signed)
Patient here with possible left inguinal hernia, states she sees a bulging and tender to area, no hx of same. No distress

## 2016-02-03 NOTE — ED Provider Notes (Signed)
[facility] DEPT Provider Note   CSN: [id number] Arrival date & time: 02/03/16  1008     History   Chief Complaint Chief Complaint  Patient presents with  . check for hernia    HPI Emily [name] is a 36 y.o. female.  HPI Patient resents with abdominal pain. Thinks that she has a hernia. States there is bulging and tenderness on her lower abdomen. States she has had some nausea and vomiting. States his site of previous surgeries. No fevers. She has had some dysuria. States there is a bulge there that is not normally there. Pain is severe.   Past Medical History:  Diagnosis Date  . Anxiety   . Asthma as a child  . Chronic back pain   . Chronic pain   . Conversion disorder   . Depression   . GERD (gastroesophageal reflux disease)   . Headache(784.0)    migraines  . HTN (hypertension) 02/27/2015  . JC virus antibody positive   . Migraines   . MS (multiple sclerosis) ([place])    06-2006  . Multiple sclerosis ([place])   . Ovarian cyst   . Panic attack   . Perforated bowel ([place]) 2009  . Pseudoseizures   . PTSD (post-traumatic stress disorder)   . S/P emergency C-section   . Urinary urgency     Patient Active Problem List   Diagnosis Date Noted  . Bilateral carpal tunnel syndrome 11/13/2015  . Substance abuse 09/07/2015  . Paresthesia 08/02/2015  . Chronic pain syndrome 08/02/2015  . Pain, postoperative, acute 07/21/2015  . Postpartum endometritis 07/21/2015  . PTSD (post-traumatic stress disorder) 05/16/2015  . Conversion disorder with seizures or convulsions 05/16/2015  . Severe episode of recurrent major depressive disorder, without psychotic features ([place]) 05/16/2015  . GAD (generalized anxiety disorder) 05/16/2015  . Difficult intravenous access 05/08/2015  . Right optic neuritis 04/27/2015  . Conversion disorder with attacks or seizures, persistent, with psychological stressor 03/28/2015  . Migraine with aura and without status migrainosus, not intractable  03/28/2015  . Generalized anxiety disorder 03/28/2015  . HTN (hypertension) 02/27/2015  . Perforated bowel ([place]) 02/27/2015  . Depression 12/27/2014  . Multiple sclerosis ([place]) 12/27/2014  . Migraine 12/27/2014  . Seizure disorder ([place]) 12/27/2014  . Smoker 12/27/2014  . Obesity 12/27/2014    Past Surgical History:  Procedure Laterality Date  . ABDOMINAL SURGERY    . APPENDECTOMY    . BOWEL RESECTION  01/2007   with colostomy  . CESAREAN SECTION N/A 07/12/2015   Procedure: CESAREAN SECTION;  Surgeon: Guss Bunde, MD;  Location: [facility];  Service: Obstetrics;  Laterality: N/A;  . COLOSTOMY CLOSURE  04/2007  . EXTREMITY CYST EXCISION  1994   right leg  . HERNIA REPAIR    . SCAR REVISION  01/21/2011   Procedure: SCAR REVISION;  Surgeon: Hermelinda Dellen;  Location: [facility];  Service: Plastics;  Laterality: N/A;  exploration of scar of abdomen and repair of defect    OB History    Gravida Para Term Preterm AB Living   1 1   1   1    SAB TAB Ectopic Multiple Live Births         0 1       Home Medications    Prior to Admission medications   Medication Sig Start Date End Date Taking? Authorizing Provider  albuterol (PROVENTIL HFA;VENTOLIN HFA) 108 (90 Base) MCG/ACT inhaler Inhale 2 puffs into the lungs every 6 (six) hours  as needed for wheezing or shortness of breath. 02/15/15  Yes Collene Leyden Teague Clark, PA-C  amLODipine (NORVASC) 5 MG tablet Take 5 mg by mouth every evening. 01/23/16  Yes Historical Provider, MD  busPIRone (BUSPAR) 15 MG tablet Take 1 tablet (15 mg total) by mouth 3 (three) times daily. 11/23/15  Yes Charlcie Cradle, MD  diazepam (VALIUM) 5 MG tablet Take 1 tablet (5 mg total) by mouth daily. Take 1 tablet as needed for panic attacks leading to seizures. 11/23/15  Yes Charlcie Cradle, MD  diphenhydrAMINE (SOMINEX) 25 MG tablet Take 25 mg by mouth at bedtime as needed for sleep.   Yes Historical Provider, MD  divalproex (DEPAKOTE) 500 MG  DR tablet Take 1 tablet at night for 1 week, then increase to 1 tablet twice a day Patient taking differently: Take 500 mg by mouth at bedtime.  09/20/15  Yes Cameron Sprang, MD  FLUoxetine 60 MG TABS Take 60 mg by mouth daily. 11/23/15  Yes Charlcie Cradle, MD  hydrochlorothiazide (HYDRODIURIL) 25 MG tablet Take 1 tablet (25 mg total) by mouth daily. 07/15/15  Yes Aletha Halim, MD  ibuprofen (ADVIL,MOTRIN) 200 MG tablet Take 600 mg by mouth every 6 (six) hours as needed for headache or moderate pain.   Yes Historical Provider, MD  methocarbamol (ROBAXIN) 500 MG tablet Take 500 mg by mouth 2 (two) times daily as needed for muscle spasms.  01/02/16  Yes Historical Provider, MD  metoCLOPramide (REGLAN) 10 MG tablet TAKE 1 TABLET AS NEEDED 2-3 TIMES A WEEK. 01/19/16  Yes Cameron Sprang, MD  Multiple Vitamins-Minerals (MULTIVITAMIN ADULT PO) Take 1 tablet by mouth daily.    Yes Historical Provider, MD  ranitidine (ZANTAC) 300 MG capsule Take 300 mg by mouth at bedtime. Reported on 07/21/2015   Yes Historical Provider, MD  simethicone (MYLICON) 80 MG chewable tablet Chew 1 tablet (80 mg total) by mouth 4 (four) times daily as needed for flatulence. 07/15/15  Yes Aletha Halim, MD  SUMAtriptan (IMITREX) 100 MG tablet Take 1 tablet (100 mg total) by mouth every 2 (two) hours as needed for migraine. May repeat in 2 hours if headache persists or recurs. 10/20/15  Yes Cameron Sprang, MD  topiramate (TOPAMAX) 50 MG tablet Take 1/2 tablet twice a day for 1 week, then increase to 1 tablet twice a day Patient taking differently: Take 50 mg by mouth 2 (two) times daily.  09/20/15  Yes Cameron Sprang, MD  cyclobenzaprine (FLEXERIL) 10 MG tablet TAKE 1 TABLET BY MOUTH 3 TIMES A DAY AS NEEDED FOR MUSCLE SPASMS Patient not taking: Reported on 02/03/2016 11/09/15   Cameron Sprang, MD  EPINEPHrine (EPIPEN) 0.3 mg/0.3 mL SOAJ injection Inject 0.3 mg into the muscle as needed (allergic reaction). Reported on 03/28/2015     Historical Provider, MD  ibuprofen (ADVIL,MOTRIN) 600 MG tablet Take 1 tablet (600 mg total) by mouth every 6 (six) hours as needed for moderate pain. Do not take on an empty stomach. Patient not taking: Reported on 02/03/2016 07/21/15   [place], DO  promethazine (PHENERGAN) 25 MG tablet Take 0.5-1 tablets (12.5-25 mg total) by mouth every 6 (six) hours as needed for nausea. Patient not taking: Reported on 02/03/2016 07/10/15   Gwynne Edinger, MD    Family History Family History  Problem Relation Age of Onset  . Diabetes Mother   . Hypertension Mother   . Diabetes Father   . Hypertension Father   . Arthritis Father   .  Cancer Maternal Grandmother   . Cancer Maternal Grandfather   . Alcohol abuse Neg Hx   . Anxiety disorder Neg Hx   . Bipolar disorder Neg Hx   . Drug abuse Neg Hx   . Depression Neg Hx     Social History Social History  Substance Use Topics  . Smoking status: Former Smoker    Packs/day: 0.25    Years: 10.00    Types: Cigarettes    Quit date: 07/12/2015  . Smokeless tobacco: Never Used  . Alcohol use No     Comment: Rare     Allergies   Amitriptyline; Baclofen; Cymbalta [duloxetine hcl]; Gabapentin; Monosodium glutamate; Other; Vicodin [hydrocodone-acetaminophen]; Alprazolam; Magnesium salicylate; Rizatriptan; Tizanidine; Tramadol; Adhesive [tape]; Lamotrigine; and Toradol [ketorolac tromethamine]   Review of Systems Review of Systems  Constitutional: Positive for appetite change. Negative for fever.  HENT: Negative for congestion.   Respiratory: Negative for shortness of breath.   Cardiovascular: Negative for chest pain.  Gastrointestinal: Positive for abdominal pain and nausea. Negative for vomiting.  Genitourinary: Negative for dysuria.  Musculoskeletal: Negative for back pain.  Skin: Negative for wound.  Neurological: Negative for seizures and headaches.  Hematological: Negative for adenopathy.  Psychiatric/Behavioral: Negative for  confusion.     Physical Exam Updated Vital Signs BP (!) 156/111   Pulse 98   Temp 98.7 F (37.1 C) (Oral)   Resp 18   Ht 5\' 5"  (1.651 m)   Wt 245 lb (111.1 kg)   LMP 01/20/2016 (Exact Date)   SpO2 (!) 89%   BMI 40.77 kg/m   Physical Exam  Constitutional: She appears well-developed.  HENT:  Head: Normocephalic.  Eyes: Pupils are equal, round, and reactive to light.  Cardiovascular:  Mild tachycardia  Pulmonary/Chest: Effort normal.  Abdominal: Soft. No hernia.  No hernia clearly palpated. There is a slight fullness at the inferior aspect of the infra abdominal surgical wound. Could be scar tissue. No reducible hernia palpated. Patient was tender at the site.  Musculoskeletal: Normal range of motion.  Neurological: She is alert.  Skin: Skin is warm. Capillary refill takes less than 2 seconds.     ED Treatments / Results  Labs (all labs ordered are listed, but only abnormal results are displayed) Labs Reviewed  CBC WITH DIFFERENTIAL/PLATELET - Abnormal; Notable for the following:       Result Value   RBC 5.35 (*)    RDW 15.6 (*)    All other components within normal limits  URINALYSIS, ROUTINE W REFLEX MICROSCOPIC  PREGNANCY, URINE  I-STAT BETA HCG BLOOD, ED ([facility], [facility], [facility] ONLY)    EKG  EKG Interpretation None       Radiology Ct Abdomen Pelvis W Contrast  Result Date: 02/03/2016 CLINICAL DATA:  Lower abdominal pain with nausea and vomiting a few days. History of previous abdominal hernia repair. EXAM: CT ABDOMEN AND PELVIS WITH CONTRAST TECHNIQUE: Multidetector CT imaging of the abdomen and pelvis was performed using the standard protocol following bolus administration of intravenous contrast. CONTRAST:  1 ISOVUE-300 IOPAMIDOL (ISOVUE-300) INJECTION 61% COMPARISON:  07/21/2015 FINDINGS: Lower chest: Lung bases are normal. Hepatobiliary: Gallbladder, liver and biliary tree are normal. Pancreas: Within normal. Spleen: Within normal. Adrenals/Urinary Tract: Adrenal  glands are normal symmetric. Kidneys are normal in size without hydronephrosis. Two punctate stones over the mid to upper pole collecting system of the right kidney. Possible 1.3 cm cyst over the upper pole right kidney adjacent the collecting system versus minimal prominence of the upper pole collecting  system without significant change. Ureters and bladder are normal. Stomach/Bowel: Stomach and small bowel are within normal. Previous appendectomy. Surgical suture line over the sigmoid colon. Minimal diverticulosis of the colon. Vascular/Lymphatic: Within normal. Reproductive: Within normal. Other: Postsurgical changes over the midline abdominal wall. Very small ventral hernia in the midline below the umbilicus containing a very short segment of small bowel. No evidence of obstruction/ incarceration. Musculoskeletal: Mild degenerate change of the spine and hips. IMPRESSION: No acute findings in the abdomen/pelvis. Small midline ventral hernia below the umbilicus containing a very short segment of small bowel. No evidence of small bowel obstruction/incarceration. Two punctate nonobstructing right renal stones. Probable 1.2 cm cyst over the upper pole right kidney unchanged. Minimal diverticulosis of the colon. Electronically Signed   By: Marin Olp M.D.   On: 02/03/2016 15:11    Procedures Procedures (including critical care time)  Medications Ordered in ED Medications  iopamidol (ISOVUE-300) 61 % injection (100 mLs Intravenous Contrast Given 02/03/16 1440)  oxyCODONE-acetaminophen (PERCOCET/ROXICET) 5-325 MG per tablet 1 tablet (1 tablet Oral Given 02/03/16 1539)     Initial Impression / Assessment and Plan / ED Course  I have reviewed the triage vital signs and the nursing notes.  Pertinent labs & imaging results that were available during my care of the patient were reviewed by me and considered in my medical decision making (see chart for details).      patient with abdominal pain. Reducible  but tender hernia. Ct showed no obstruction or evidence of injury to bowel. Will d/c to follow with general surgery  Final Clinical Impressions(s) / ED Diagnoses   Final diagnoses:  Ventral hernia without obstruction or gangrene    New Prescriptions Discharge Medication List as of 02/03/2016  3:28 PM       Davonna Belling, MD 02/03/16 1644

## 2016-02-03 NOTE — ED Notes (Signed)
Attempted IV stick X2. Phlebotomy to draw labs.

## 2016-02-08 ENCOUNTER — Ambulatory Visit (INDEPENDENT_AMBULATORY_CARE_PROVIDER_SITE_OTHER): Payer: Medicare Other | Admitting: Neurology

## 2016-02-08 ENCOUNTER — Encounter: Payer: Self-pay | Admitting: Neurology

## 2016-02-08 VITALS — BP 140/90 | HR 111 | Ht 65.0 in | Wt 246.2 lb

## 2016-02-08 DIAGNOSIS — F445 Conversion disorder with seizures or convulsions: Secondary | ICD-10-CM

## 2016-02-08 DIAGNOSIS — F411 Generalized anxiety disorder: Secondary | ICD-10-CM | POA: Diagnosis not present

## 2016-02-08 DIAGNOSIS — G43109 Migraine with aura, not intractable, without status migrainosus: Secondary | ICD-10-CM

## 2016-02-08 DIAGNOSIS — G5603 Carpal tunnel syndrome, bilateral upper limbs: Secondary | ICD-10-CM

## 2016-02-08 DIAGNOSIS — G35D Multiple sclerosis, unspecified: Secondary | ICD-10-CM

## 2016-02-08 DIAGNOSIS — G35 Multiple sclerosis: Secondary | ICD-10-CM | POA: Diagnosis not present

## 2016-02-08 MED ORDER — TOPIRAMATE 50 MG PO TABS: 50.0000 mg | ORAL_TABLET | Freq: Two times a day (BID) | ORAL | 3 refills | Status: DC

## 2016-02-08 MED ORDER — SUMATRIPTAN SUCCINATE 100 MG PO TABS: 100.0000 mg | ORAL_TABLET | ORAL | 6 refills | Status: DC | PRN

## 2016-02-08 MED ORDER — DIVALPROEX SODIUM 500 MG PO DR TAB: 500.0000 mg | DELAYED_RELEASE_TABLET | Freq: Every day | ORAL | 3 refills | Status: DC

## 2016-02-08 NOTE — Progress Notes (Signed)
NEUROLOGY FOLLOW UP OFFICE NOTE  Emily [name] BD:[id number]  HISTORY OF PRESENT ILLNESS: I had the pleasure of seeing Emily [name] in follow-up in the neurology clinic on 02/08/2016.  The patient was last seen 3 months ago for several neurological conditions. She has a history of MS and stopped Gilenya during pregnancy. She is still awaiting Gilenya, stating she was instructed to call our office to schedule first-dose observation (this is not done in our office). She has seen ophthalmology and done the bloodwork. She feels that overall her neurological symptoms have been stable, she is Depakote 500mg  BID and Topamax 50mg  BID for migraines without side effects. She has around 2 migraines a week and is still waiting re-approval for Botox which she was receiving in the past. She is dealing with other issues at this time, she was vomiting and fatigued, and found to have a hernia. She reports her BP was elevated when her teeth were taken out. She is also having a lot of lower back spasms. She continues to wear her wrist splints for the carpal tunnel syndrome. Her mother is helping more with her baby Daneil Dan.   HPI 03/28/15: This is a pleasant 36 yo RH woman with a history of relapsing-remitting multiple sclerosis, headaches, anxiety with panic attacks, conversion disorder with seizures. She presented at [redacted] weeks pregnant to determine if diazepam for anxiety/seizures would be a medication she can use, understanding this is pregnancy category D versus risks of injuring herself from falls when she has the psychogenic seizures. Records from her previous neurologists at [facility] were reviewed. She was diagnosed with MS in June 2008 while living in [place] state, she started having right thigh pain and right eye blurred vision. She was diagnosed with right optic neuritis and underwent MRI brain and lumbar puncture which confirmed a diagnosis of MS. 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 She has been on Gilenya since 2014 without any relapse. This was discontinued in December 2016 when she found out she was pregnant. She has a history of chronic pain and migraines, and was taking Depakote and Topamax for headache prophylaxis, also stopped in December 2016. She reports having Botox for her migraines, but so far migraines are not as bad as they were.  She started having shaking episodes in April 2016. Initially Depakote dose was increased to 500mg  BID. She was back in May 2016 with multiple episodes of syncope, palpitations, a sensation of her heart jumping out of her chest. She also reported lightheadedness, facial numbness, and numbness in her hands and feet. On her last visit in June 2016 at [facility], shaking episodes were discussed. Events always occur after a stressful event. She had a spell in [place] where she had a normal EEG and was diagnosed with a conversion disorder. She had seen Dr. Darleene Cleaver and reportedly told her he could not help her because "her case was too complex." She started seeing a psychiatrist in [place], however since she switched neurologists, she reports that he cannot see her anymore. She has not been able to establish care with [facility] and continues to have anxiety and panic attacks "all the time." She is diaphoretic in the office today and reports she always feels anxious. When she starts having one of her seizures, she would have significant anxiety, "my body can't deal and goes into a seizure," she hears  her heartbeat in her ears. If it slowly builds up, she can get herself to a safe place, however other times it "just hits me," and she falls to the ground and has injured herself. She reports falling out one time in the bathroom during a doctor visit. She is currently on Fluoxetine and Buspirone. Clonazepam in the past did not  help. She would usually go to the ER for these spells, which would quiet down once she gets Benadryl and Diazepam.  She delivered early at [redacted] weeks gestation last July 12, 2015. A week after delivery, she started having numbness on the top of both feet, pain in the balls of her feet, with numbness traveling up to her mid-back. She also has numbness in both forearms, and reports her palms feel the same as her soles, with constant stabbing burning pain. She had these symptoms pre-pregnancy but states the numbness only went up to her knees. During pregnancy, all her symptoms were less bothersome. She has been following with Pain management in [facility] and would occasionally take Oxycodone prior to her pregnancy.  Diagnostic Data:  I personally reviewed MRI brain with and without contrast done with [facility] last 10/02/13 which did not show any acute changes, there was multiple T2/FLAIR lesions in the subcortical and deep white matter regions, no abnormal enhancement. Hippocampi appear asymmetric, smaller on the right without abnormal signal or enhancement.  MRI C-spine with and without contrast 10/18/13 showed hazy T2 hyperintensity within the cord from C2-3 to C4-5 without abnormal enhancement.  per [facility] - MRI brain done 03/18/14: "No change in numerous multifocal T2 hyperintense white matter lesions relative to prior MRI from 04/17/2013 most consistent with demyelinating lesions given history of multiple sclerosis. No evidence of active demyelination."  MRI cervical spine done 03/18/14: "Relative to prior MRI from 10/20/2009, no change in patchy foci of increased T2 signal in the dorsal cord at C2- C4 in keeping with demyelinating lesions in this patient with a clinical history of multiple sclerosis.  No evidence of enhancement to suggest active demyelination. Moderately advanced degenerative disease at C5-C6 and C6-C7 with interval development of left eccentric central, foraminal entry zone and foraminal  disc extrusion at C6-C7 exerting mass effect on the exiting C7 and descending C8 roots.  MRI brain and C-spine 07/30/2015 with and without contrast : There was no abnormal enhancement. There were no changes from previous MRI brain in 09/2013 with multiple bilateral subcortical greater than 10 periventricular T2 hyperintensities. There were no cord lesions seen up to T3-4. There was chronic degenerative disc disease and central canal stenosis at C5-6 and C6-7, no abnormal cord signal, unchanged from 06/2014.  EMG/NCV done on both UE and right LE showed bilateral carpal tunnel syndrome, mild to moderate in degree, no evidence of polyneuropathy or radiculopathy on the right side.  PAST MEDICAL HISTORY: Past Medical History:  Diagnosis Date  . Anxiety   . Asthma as a child  . Chronic back pain   . Chronic pain   . Conversion disorder   . Depression   . GERD (gastroesophageal reflux disease)   . Headache(784.0)    migraines  . HTN (hypertension) 02/27/2015  . JC virus antibody positive   . Migraines   . MS (multiple sclerosis) ([place])    06-2006  . Multiple sclerosis ([place])   . Ovarian cyst   . Panic attack   . Perforated bowel ([place]) 2009  . Pseudoseizures   . PTSD (post-traumatic stress disorder)   . S/P  emergency C-section   . Urinary urgency     MEDICATIONS: Current Outpatient Prescriptions on File Prior to Visit  Medication Sig Dispense Refill  . albuterol (PROVENTIL HFA;VENTOLIN HFA) 108 (90 Base) MCG/ACT inhaler Inhale 2 puffs into the lungs every 6 (six) hours as needed for wheezing or shortness of breath. 1 Inhaler 0  . amLODipine (NORVASC) 5 MG tablet Take 5 mg by mouth every evening.  2  . busPIRone (BUSPAR) 15 MG tablet Take 1 tablet (15 mg total) by mouth 3 (three) times daily. 90 tablet 3  . cyclobenzaprine (FLEXERIL) 10 MG tablet TAKE 1 TABLET BY MOUTH 3 TIMES A DAY AS NEEDED FOR MUSCLE SPASMS 30 tablet 0  . diazepam (VALIUM) 5 MG tablet Take 1 tablet (5 mg total) by mouth  daily. Take 1 tablet as needed for panic attacks leading to seizures. 30 tablet 3  . diphenhydrAMINE (SOMINEX) 25 MG tablet Take 25 mg by mouth at bedtime as needed for sleep.    . divalproex (DEPAKOTE) 500 MG DR tablet Take 1 tablet at night for 1 week, then increase to 1 tablet twice a day (Patient taking differently: Take 500 mg by mouth at bedtime. ) 60 tablet 6  . EPINEPHrine (EPIPEN) 0.3 mg/0.3 mL SOAJ injection Inject 0.3 mg into the muscle as needed (allergic reaction). Reported on 03/28/2015    . FLUoxetine 60 MG TABS Take 60 mg by mouth daily. 30 tablet 3  . hydrochlorothiazide (HYDRODIURIL) 25 MG tablet Take 1 tablet (25 mg total) by mouth daily. 30 tablet 1  . ibuprofen (ADVIL,MOTRIN) 200 MG tablet Take 600 mg by mouth every 6 (six) hours as needed for headache or moderate pain.    Marland Kitchen ibuprofen (ADVIL,MOTRIN) 600 MG tablet Take 1 tablet (600 mg total) by mouth every 6 (six) hours as needed for moderate pain. Do not take on an empty stomach. 60 tablet 0  . methocarbamol (ROBAXIN) 500 MG tablet Take 500 mg by mouth 2 (two) times daily as needed for muscle spasms.   2  . metoCLOPramide (REGLAN) 10 MG tablet TAKE 1 TABLET AS NEEDED 2-3 TIMES A WEEK. 10 tablet 0  . Multiple Vitamins-Minerals (MULTIVITAMIN ADULT PO) Take 1 tablet by mouth daily.     . promethazine (PHENERGAN) 25 MG tablet Take 0.5-1 tablets (12.5-25 mg total) by mouth every 6 (six) hours as needed for nausea. 30 tablet 0  . ranitidine (ZANTAC) 300 MG capsule Take 300 mg by mouth at bedtime. Reported on 07/21/2015    . simethicone (MYLICON) 80 MG chewable tablet Chew 1 tablet (80 mg total) by mouth 4 (four) times daily as needed for flatulence. 30 tablet 0  . SUMAtriptan (IMITREX) 100 MG tablet Take 1 tablet (100 mg total) by mouth every 2 (two) hours as needed for migraine. May repeat in 2 hours if headache persists or recurs. 9 tablet 6  . topiramate (TOPAMAX) 50 MG tablet Take 1/2 tablet twice a day for 1 week, then increase to 1  tablet twice a day (Patient taking differently: Take 50 mg by mouth 2 (two) times daily. ) 60 tablet 6   Current Facility-Administered Medications on File Prior to Visit  Medication Dose Route Frequency Provider Last Rate Last Dose  . dexamethasone (DECADRON) injection 10 mg  10 mg Intravenous Once Milton Ferguson, MD      . metoCLOPramide (REGLAN) injection 10 mg  10 mg Intravenous Once Milton Ferguson, MD        ALLERGIES: Allergies  Allergen Reactions  .  Amitriptyline Hypertension and Other (See Comments)    hypertension  . Baclofen Hives and Shortness Of Breath  . Cymbalta [Duloxetine Hcl] Shortness Of Breath and Rash  . Gabapentin Shortness Of Breath and Rash  . Monosodium Glutamate Anaphylaxis  . Other Shortness Of Breath and Rash    MSG, beans (vomiting)  . Vicodin [Hydrocodone-Acetaminophen] Hives and Nausea And Vomiting    Projectile vomiting  . Alprazolam Other (See Comments)    Lethargy  . Magnesium Salicylate Hives and Itching  . Rizatriptan Nausea And Vomiting and Other (See Comments)    GI upset, Projectile vomiting  . Tizanidine Hives  . Tramadol Other (See Comments)    Unspecified  . Adhesive [Tape] Other (See Comments)    Skin irritation Paper tape is ok  . Lamotrigine Rash  . Toradol [Ketorolac Tromethamine] Nausea Only    FAMILY HISTORY: Family History  Problem Relation Age of Onset  . Diabetes Mother   . Hypertension Mother   . Diabetes Father   . Hypertension Father   . Arthritis Father   . Cancer Maternal Grandmother   . Cancer Maternal Grandfather   . Alcohol abuse Neg Hx   . Anxiety disorder Neg Hx   . Bipolar disorder Neg Hx   . Drug abuse Neg Hx   . Depression Neg Hx     SOCIAL HISTORY: Social History   Social History  . Marital status: Single    Spouse name: N/A  . Number of children: 0  . Years of education: College   Occupational History  .  Other    disability   Social History Main Topics  . Smoking status: Former Smoker     Packs/day: 0.25    Years: 10.00    Types: Cigarettes    Quit date: 07/12/2015  . Smokeless tobacco: Never Used  . Alcohol use No     Comment: Rare  . Drug use: No  . Sexual activity: Yes    Birth control/ protection: None   Other Topics Concern  . Not on file   Social History Narrative   Patient lives at home with fiance in [place]. Born and raised in [place], [place] by parents. Pt has one younger sister. Pt has a Best boy. Pt worked from 1999-2006. She stopped due to MS and is currently on disability. Pt is pregnant with her 1st child. Married for less than 1 yr in 1999 that ended in divorce.             Caffeine Use: 1 20oz soda daily    REVIEW OF SYSTEMS: Constitutional: No fevers, chills, or sweats, + generalized fatigue, no change in appetite Eyes: as above, no double vision, eye pain Ear, nose and throat: No hearing loss, ear pain, nasal congestion, sore throat Cardiovascular: No chest pain, palpitations Respiratory:  No shortness of breath at rest or with exertion, wheezes GastrointestinaI: No nausea, vomiting, diarrhea, abdominal pain, fecal incontinence Genitourinary:  No dysuria, urinary retention, +urinary frequency Musculoskeletal:  + neck pain, back pain Integumentary: No rash, pruritus, skin lesions Neurological: as above Psychiatric: + depression, anxiety Endocrine: No palpitations, fatigue, diaphoresis, mood swings, change in appetite, change in weight, increased thirst Hematologic/Lymphatic:  No anemia, purpura, petechiae. Allergic/Immunologic: no itchy/runny eyes, nasal congestion, recent allergic reactions, rashes  PHYSICAL EXAM: Vitals:   02/08/16 1422  BP: 140/90  Pulse: (!) 111   General: No acute distress Head: Normocephalic/atraumatic Eyes: Fundoscopic exam shows bilateral sharp discs, no vessel changes, exudates, or hemorrhages Neck:  supple, no paraspinal tenderness, full range of motion Back: No paraspinal  tenderness Heart: regular rate and rhythm Lungs: Clear to auscultation bilaterally. Vascular: No carotid bruits. Skin/Extremities: No rash, no edema Neurological Exam: Mental status: alert and oriented to person, place, and time, no dysarthria or aphasia, Fund of knowledge is appropriate. Recent and remote memory are intact. Attention and concentration are normal. Able to name objects and repeat phrases. Cranial nerves: CN I: not tested CN II: pupils equal, round and reactive to light,no APD, visual fields intact, fundi unremarkable. CN III, IV, VI: full range of motion, no nystagmus, no ptosis CN V: facial sensation intact CN VII: upper and lower face symmetric CN VIII: hearing intact to finger rub CN IX, X: gag intact, uvula midline CN XI: sternocleidomastoid and trapezius muscles intact CN XII: tongue midline Bulk & Tone: normal, no fasciculations. Motor: 5/5 throughout with no pronator drift. Sensation: intact to light touch Deep Tendon Reflexes: +2 throughout, no ankle clonus Plantar responses: downgoing bilaterally Cerebellar: no incoordination on finger to nose testing Gait: slow and cautious, no ataxia Tremor: none  IMPRESSION: This is a 36 yo RH woman with a history of relapsing-remitting multiple sclerosis, migraine with aura, anxiety with panic attacks, conversion disorder with psychogenic seizures. She had several neurological symptoms that have recurred post-pregnancy. She is back on Depakote 500mg  BID and Topamax 50mg  BID for migraine prophylaxis and awaiting re-approval of Botox. She is awaiting Gilenya, clarification about first dose visit will be done, there appears to be some confusion/miscommunication. She continues to work with her psychiatrist for the anxiety. Continue bilateral wrist splints for carpal tunnel syndrome. She does not drive. She will follow-up in 6 months and knows to call for any changes.   Thank you for allowing me to participate in her care.   Please do not hesitate to call for any questions or concerns.  The duration of this appointment visit was 25 minutes of face-to-face time with the patient.  Greater than 50% of this time was spent in counseling, explanation of diagnosis, planning of further management, and coordination of care.   Ellouise Newer, M.D.   CC: [facility]

## 2016-02-08 NOTE — Patient Instructions (Signed)
1. Continue all your medications 2. Proceed with starting Gilenya 3. We will keep working on the Botox 4. Wishing you well with the surgery! 5. Follow-up in 6 months, call for any changes

## 2016-02-09 ENCOUNTER — Telehealth: Payer: Self-pay

## 2016-02-09 NOTE — Telephone Encounter (Signed)
Notified patient I spoke with Gilenya. They state since patient has government insurance she will have to have initial dose in a clinic, they do not approve in home initial dose. Gilenya states they will call patient and set up a starter clinic dose at a clinic near her. Advised patient to call us for any more questions or concerns.

## 2016-02-15 ENCOUNTER — Ambulatory Visit (HOSPITAL_COMMUNITY)
Admission: AD | Admit: 2016-02-15 | Discharge: 2016-02-18 | Disposition: A | Discharge status: Home / Self Care | Payer: Medicare Other | Source: Ambulatory Visit | Attending: General Surgery | Admitting: General Surgery

## 2016-02-15 ENCOUNTER — Other Ambulatory Visit: Payer: Self-pay | Admitting: General Surgery

## 2016-02-15 ENCOUNTER — Observation Stay (HOSPITAL_COMMUNITY): Payer: Medicare Other

## 2016-02-15 ENCOUNTER — Encounter (HOSPITAL_COMMUNITY): Payer: Self-pay

## 2016-02-15 ENCOUNTER — Ambulatory Visit: Payer: Self-pay | Admitting: General Surgery

## 2016-02-15 DIAGNOSIS — N281 Cyst of kidney, acquired: Secondary | ICD-10-CM | POA: Insufficient documentation

## 2016-02-15 DIAGNOSIS — K432 Incisional hernia without obstruction or gangrene: Secondary | ICD-10-CM | POA: Diagnosis present

## 2016-02-15 DIAGNOSIS — I10 Essential (primary) hypertension: Secondary | ICD-10-CM | POA: Insufficient documentation

## 2016-02-15 DIAGNOSIS — Z87891 Personal history of nicotine dependence: Secondary | ICD-10-CM | POA: Diagnosis not present

## 2016-02-15 DIAGNOSIS — Z91018 Allergy to other foods: Secondary | ICD-10-CM | POA: Diagnosis not present

## 2016-02-15 DIAGNOSIS — K430 Incisional hernia with obstruction, without gangrene: Secondary | ICD-10-CM | POA: Diagnosis not present

## 2016-02-15 DIAGNOSIS — G40909 Epilepsy, unspecified, not intractable, without status epilepticus: Secondary | ICD-10-CM | POA: Diagnosis not present

## 2016-02-15 DIAGNOSIS — F329 Major depressive disorder, single episode, unspecified: Secondary | ICD-10-CM | POA: Diagnosis not present

## 2016-02-15 DIAGNOSIS — Z885 Allergy status to narcotic agent status: Secondary | ICD-10-CM | POA: Insufficient documentation

## 2016-02-15 DIAGNOSIS — G35 Multiple sclerosis: Secondary | ICD-10-CM | POA: Insufficient documentation

## 2016-02-15 DIAGNOSIS — K219 Gastro-esophageal reflux disease without esophagitis: Secondary | ICD-10-CM | POA: Insufficient documentation

## 2016-02-15 DIAGNOSIS — Z888 Allergy status to other drugs, medicaments and biological substances status: Secondary | ICD-10-CM | POA: Insufficient documentation

## 2016-02-15 DIAGNOSIS — F410 Panic disorder [episodic paroxysmal anxiety] without agoraphobia: Secondary | ICD-10-CM | POA: Insufficient documentation

## 2016-02-15 DIAGNOSIS — F449 Dissociative and conversion disorder, unspecified: Secondary | ICD-10-CM | POA: Insufficient documentation

## 2016-02-15 DIAGNOSIS — F418 Other specified anxiety disorders: Secondary | ICD-10-CM | POA: Insufficient documentation

## 2016-02-15 DIAGNOSIS — F4310 Post-traumatic stress disorder, unspecified: Secondary | ICD-10-CM | POA: Insufficient documentation

## 2016-02-15 DIAGNOSIS — Z6841 Body Mass Index (BMI) 40.0 and over, adult: Secondary | ICD-10-CM | POA: Insufficient documentation

## 2016-02-15 DIAGNOSIS — Z9049 Acquired absence of other specified parts of digestive tract: Secondary | ICD-10-CM | POA: Insufficient documentation

## 2016-02-15 DIAGNOSIS — R10819 Abdominal tenderness, unspecified site: Secondary | ICD-10-CM | POA: Diagnosis not present

## 2016-02-15 DIAGNOSIS — G8929 Other chronic pain: Secondary | ICD-10-CM | POA: Diagnosis not present

## 2016-02-15 LAB — CBC
HCT: 38.1 % (ref 36.0–46.0)
Hemoglobin: 12.9 g/dL (ref 12.0–15.0)
MCH: 27.4 pg (ref 26.0–34.0)
MCHC: 33.9 g/dL (ref 30.0–36.0)
MCV: 80.9 fL (ref 78.0–100.0)
PLATELETS: 384 10*3/uL (ref 150–400)
RBC: 4.71 MIL/uL (ref 3.87–5.11)
RDW: 15.2 % (ref 11.5–15.5)
WBC: 11 10*3/uL — ABNORMAL HIGH (ref 4.0–10.5)

## 2016-02-15 LAB — BASIC METABOLIC PANEL
Anion gap: 8 (ref 5–15)
BUN: 6 mg/dL (ref 6–20)
CO2: 22 mmol/L (ref 22–32)
Calcium: 8.4 mg/dL — ABNORMAL LOW (ref 8.9–10.3)
Chloride: 105 mmol/L (ref 101–111)
Creatinine, Ser: 0.5 mg/dL (ref 0.44–1.00)
GFR calc Af Amer: >60 mL/min (ref >60)
Glucose, Bld: 92 mg/dL (ref 65–99)
Potassium: 2.8 mmol/L — ABNORMAL LOW (ref 3.5–5.1)
SODIUM: 135 mmol/L (ref 135–145)

## 2016-02-15 LAB — LACTIC ACID, PLASMA: LACTIC ACID, VENOUS: 0.9 mmol/L (ref 0.5–1.9)

## 2016-02-15 MED ORDER — MORPHINE SULFATE (PF) 4 MG/ML IV SOLN
1.0000 mg | INTRAVENOUS | Status: DC | PRN
  Administered 2016-02-15 – 2016-02-16 (×4): 2 mg via INTRAVENOUS
  Filled 2016-02-15 (×4): qty 1

## 2016-02-15 MED ORDER — IOPAMIDOL (ISOVUE-300) INJECTION 61%
30.0000 mL | Freq: Once | INTRAVENOUS | Status: AC | PRN
  Administered 2016-02-15: 30 mL via ORAL

## 2016-02-15 MED ORDER — SODIUM CHLORIDE 0.9 % IV SOLN: 4.0000 mg | Freq: Four times a day (QID) | INTRAVENOUS | Status: DC | PRN

## 2016-02-15 MED ORDER — HYDRALAZINE HCL 20 MG/ML IJ SOLN: 5.0000 mg | Freq: Four times a day (QID) | INTRAMUSCULAR | Status: DC | PRN

## 2016-02-15 MED ORDER — ALBUTEROL SULFATE (2.5 MG/3ML) 0.083% IN NEBU: 3.0000 mL | INHALATION_SOLUTION | Freq: Four times a day (QID) | RESPIRATORY_TRACT | Status: DC | PRN

## 2016-02-15 MED ORDER — FLUOXETINE HCL 20 MG PO CAPS
60.0000 mg | ORAL_CAPSULE | Freq: Every day | ORAL | Status: DC
  Administered 2016-02-16 – 2016-02-18 (×3): 60 mg via ORAL
  Filled 2016-02-15 (×3): qty 3

## 2016-02-15 MED ORDER — SODIUM CHLORIDE 0.45 % IV SOLN
INTRAVENOUS | Status: DC
  Administered 2016-02-15: 125 mL/h via INTRAVENOUS
  Administered 2016-02-15: 18:00:00 via INTRAVENOUS
  Administered 2016-02-16: 125 mL/h via INTRAVENOUS

## 2016-02-15 MED ORDER — AMLODIPINE BESYLATE 5 MG PO TABS
5.0000 mg | ORAL_TABLET | Freq: Every evening | ORAL | Status: DC
  Administered 2016-02-15 – 2016-02-17 (×3): 5 mg via ORAL
  Filled 2016-02-15 (×3): qty 1

## 2016-02-15 MED ORDER — IOPAMIDOL (ISOVUE-300) INJECTION 61%
100.0000 mL | Freq: Once | INTRAVENOUS | Status: AC | PRN
  Administered 2016-02-15: 100 mL via INTRAVENOUS

## 2016-02-15 MED ORDER — IOPAMIDOL (ISOVUE-300) INJECTION 61%
INTRAVENOUS | Status: AC
  Filled 2016-02-15: qty 100

## 2016-02-15 MED ORDER — DEXTROSE 5 % IV SOLN
500.0000 mg | Freq: Three times a day (TID) | INTRAVENOUS | Status: DC | PRN
  Filled 2016-02-15: qty 5

## 2016-02-15 MED ORDER — IOPAMIDOL (ISOVUE-300) INJECTION 61%
INTRAVENOUS | Status: AC
  Filled 2016-02-15: qty 30

## 2016-02-15 MED ORDER — ONDANSETRON 4 MG PO TBDP: 4.0000 mg | ORAL_TABLET | Freq: Four times a day (QID) | ORAL | Status: DC | PRN

## 2016-02-15 MED ORDER — DIVALPROEX SODIUM 250 MG PO DR TAB
500.0000 mg | DELAYED_RELEASE_TABLET | Freq: Every day | ORAL | Status: DC
  Administered 2016-02-15 – 2016-02-17 (×3): 500 mg via ORAL
  Filled 2016-02-15 (×3): qty 2

## 2016-02-15 MED ORDER — HYDROMORPHONE HCL 2 MG/ML IJ SOLN: 1.0000 mg | INTRAMUSCULAR | Status: DC | PRN

## 2016-02-15 MED ORDER — PROMETHAZINE HCL 25 MG/ML IJ SOLN: 12.5000 mg | Freq: Four times a day (QID) | INTRAMUSCULAR | Status: DC | PRN

## 2016-02-15 MED ORDER — PANTOPRAZOLE SODIUM 40 MG IV SOLR
40.0000 mg | Freq: Every day | INTRAVENOUS | Status: DC
  Administered 2016-02-15: 40 mg via INTRAVENOUS
  Filled 2016-02-15: qty 40

## 2016-02-15 MED ORDER — ONDANSETRON HCL 4 MG/2ML IJ SOLN
4.0000 mg | Freq: Four times a day (QID) | INTRAMUSCULAR | Status: DC | PRN
  Administered 2016-02-16 (×2): 4 mg via INTRAVENOUS
  Filled 2016-02-15: qty 2

## 2016-02-15 MED ORDER — ENOXAPARIN SODIUM 150 MG/ML ~~LOC~~ SOLN: 40.0000 mg | SUBCUTANEOUS | Status: DC

## 2016-02-15 MED ORDER — BUSPIRONE HCL 5 MG PO TABS
15.0000 mg | ORAL_TABLET | Freq: Three times a day (TID) | ORAL | Status: DC
  Administered 2016-02-15 – 2016-02-18 (×7): 15 mg via ORAL
  Filled 2016-02-15 (×7): qty 1

## 2016-02-15 MED ORDER — DIAZEPAM 5 MG PO TABS: 5.0000 mg | ORAL_TABLET | Freq: Every day | ORAL | Status: DC

## 2016-02-15 MED ORDER — DIPHENHYDRAMINE HCL 25 MG PO CAPS: 25.0000 mg | ORAL_CAPSULE | Freq: Four times a day (QID) | ORAL | Status: DC | PRN

## 2016-02-15 MED ORDER — SODIUM CHLORIDE 0.9 % IJ SOLN
INTRAMUSCULAR | Status: AC
  Filled 2016-02-15: qty 50

## 2016-02-15 MED ORDER — SODIUM CHLORIDE 0.9 % IV SOLN: INTRAVENOUS | Status: DC

## 2016-02-15 MED ORDER — DIPHENHYDRAMINE HCL 50 MG/ML IJ SOLN: 25.0000 mg | Freq: Four times a day (QID) | INTRAMUSCULAR | Status: DC | PRN

## 2016-02-15 MED ORDER — SIMETHICONE 80 MG PO CHEW: 80.0000 mg | CHEWABLE_TABLET | Freq: Four times a day (QID) | ORAL | Status: DC | PRN

## 2016-02-15 MED ORDER — DIAZEPAM 5 MG PO TABS
5.0000 mg | ORAL_TABLET | Freq: Every day | ORAL | Status: DC | PRN
  Administered 2016-02-16: 5 mg via ORAL
  Filled 2016-02-15: qty 1

## 2016-02-15 MED ORDER — TOPIRAMATE 25 MG PO TABS
50.0000 mg | ORAL_TABLET | Freq: Two times a day (BID) | ORAL | Status: DC
  Administered 2016-02-15 – 2016-02-18 (×6): 50 mg via ORAL
  Filled 2016-02-15 (×6): qty 2

## 2016-02-15 NOTE — H&P (Signed)
istory of Present Illness Emily Bookbinder MD; 02/15/2016 2:32 PM) The patient is a 36 year old female who presents with an incisional hernia. 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 she later was taken to or in 2013 by plastic surgery for continued wound drainage and underwent what is reported as primary repair of incisional hernia. In July she had a emergency c/s. she was noted to have hemoperitoneum at that time a general surgery consult. she was noted to have adhesions and was closed primarily. she had acute pain end of january and was seen in the er. she was noted to have a low incisional hernia. this has continued to be tender increasingly so. she has n/v, she is passing flatus. she is umcomfortable and crying when I walk in the room   Past Surgical History Emily [name], [place]; 02/15/2016 1:59 PM) Appendectomy Cesarean Section - 1  Diagnostic Studies History Emily [name], RMA; 02/15/2016 1:59 PM) Colonoscopy 5-10 years ago Mammogram never Pap Smear 1-5 years ago  Allergies Emily [name], RMA; 02/15/2016 2:06 PM) Amitriptyline HCl *ANTIDEPRESSANTS* hypertension Baclofen *DERMATOLOGICALS* Hives, Shortness of breath. Cymbalta *ANTIDEPRESSANTS* Shortness of breath, Rash. Gabapentin *ANTICONVULSANTS* Shortness of breath, Rash. MONOSODIUM GLUTAMATE Anaphylaxis. Vicodin *ANALGESICS - OPIOID* Hives, Nausea, Vomiting. ALPRAZolam *ANTIANXIETY AGENTS* Magnesium Salicylate *CHEMICALS* Hives, Itching. Rizatriptan Benzoate *MIGRAINE PRODUCTS* Nausea, Vomiting. Tizanidine Comfort Pac *MUSCULOSKELETAL THERAPY AGENTS* Hives. TraMADol HCl *ANALGESICS - OPIOID* Adhesive 1"x6yd *MEDICAL DEVICES AND SUPPLIES* LamoTRIgine *ANTICONVULSANTS* Rash. Toradol *ANALGESICS - ANTI-INFLAMMATORY* Nausea.  Medication History Emily [name], [place]; 02/15/2016 2:12 PM) Divalproex Sodium  (Migraine) (500MG  Tablet ER 24HR, Oral at bedtime) Active. Topiramate (50MG  Tablet, Oral two times daily) Active. SUMAtriptan Succinate (100MG  Tablet, Oral as needed) Active. AmLODIPine Besylate (5MG  Tablet, Oral every morning) Active. Methocarbamol (500MG  Tablet, Oral two times daily as needed) Active. BusPIRone HCl (15MG  Tablet, Oral three times daily) Active. piazepam (5mg  daily) Active. FLUoxetine HCl (60MG  Tablet, Oral daily) Active. HydroCHLOROthiazide (25MG  Tablet, Oral daily) Active. Metoclopramide HCl (10MG  Tablet, Oral as needed) Active. Albuterol Sulfate HFA (108 (90 Base)MCG/ACT Aerosol Soln, Inhalation) Active. epi pen  (0.3mg ) Active. RaNITidine HCl (300MG  Tablet, Oral at bedtime) Active. Medications Reconciled  Social History Emily [name], [place]; 02/15/2016 1:59 PM) Alcohol use Occasional alcohol use. Caffeine use Carbonated beverages. No drug use Tobacco use Former smoker.  Family History Emily [name], [place]; 02/15/2016 1:59 PM) Diabetes Mellitus Father, Mother. Hypertension Father, Mother.  Pregnancy / Birth History Emily [name], [place]; 02/15/2016 1:59 PM) Age at menarche 89 years. Contraceptive History Contraceptive implant. Gravida 1 Maternal age 11-40 Para 1 Regular periods  Other Problems Emily [name], [place]; 02/15/2016 1:59 PM) Anxiety Disorder Back Pain Depression Gastroesophageal Reflux Disease Hemorrhoids High blood pressure Kidney Stone Migraine Headache Other disease, cancer, significant illness Seizure Disorder Ventral Hernia Repair     Review of Systems Emily [name] RMA; 02/15/2016 1:59 PM) General Present- Chills, Fatigue, Fever and Night Sweats. Not Present- Appetite Loss, Weight Gain and Weight Loss. Skin Not Present- Change in Wart/Mole, Dryness, Hives, Jaundice, New Lesions, Non-Healing Wounds, Rash and Ulcer. HEENT Present- Seasonal Allergies and Wears glasses/contact lenses. Not  Present- Earache, Hearing Loss, Hoarseness, Nose Bleed, Oral Ulcers, Ringing in the Ears, Sinus Pain, Sore Throat, Visual Disturbances and Yellow Eyes. Respiratory Not Present- Bloody sputum, Chronic Cough, Difficulty Breathing, Snoring and Wheezing. Breast Not Present- Breast Mass, Breast Pain, Nipple Discharge and Skin Changes. Cardiovascular Not Present- Chest Pain, Difficulty Breathing Lying Down, Leg Cramps,  Palpitations, Rapid Heart Rate, Shortness of Breath and Swelling of Extremities. Gastrointestinal Present- Abdominal Pain, Difficulty Swallowing, Hemorrhoids, Nausea and Vomiting. Not Present- Bloating, Bloody Stool, Change in Bowel Habits, Chronic diarrhea, Constipation, Excessive gas, Gets full quickly at meals, Indigestion and Rectal Pain. Female Genitourinary Present- Frequency and Urgency. Not Present- Nocturia, Painful Urination and Pelvic Pain. Musculoskeletal Present- Back Pain. Not Present- Joint Pain, Joint Stiffness, Muscle Pain, Muscle Weakness and Swelling of Extremities. Neurological Present- Fainting, Headaches, Numbness, Seizures, Tingling, Trouble walking and Weakness. Not Present- Decreased Memory and Tremor. Endocrine Not Present- Cold Intolerance, Excessive Hunger, Hair Changes, Heat Intolerance, Hot flashes and New Diabetes. Hematology Not Present- Blood Thinners, Easy Bruising, Excessive bleeding, Gland problems, HIV and Persistent Infections.  Vitals Emily [name] RMA; 02/15/2016 2:12 PM) 02/15/2016 2:12 PM Weight: 248.4 lb Height: 65in Body Surface Area: 2.17 m Body Mass Index: 41.34 kg/m  Temp.: 99.67F  Pulse: 112 (Regular)  BP: 160/100 (Sitting, Left Arm, Standard)      Physical Exam Emily Bookbinder MD; 02/15/2016 2:27 PM)  General Mental Status-Alert. Orientation-Oriented X3.  Chest and Lung Exam Chest and lung exam reveals -on auscultation, normal breath sounds, no adventitious sounds and normal vocal  resonance.  Cardiovascular Cardiovascular examination reveals -normal heart sounds, regular rate and rhythm with no murmurs.  Abdomen Note: soft obese some bs well healed midline scar with tender firmness at lower aspect. she is barely able to tolerate palpation of this area, I cannot tell if this is reducible at all and I am concerned due to her exam  Lymphatic Head & Neck  General Head & Neck Lymphatics: Bilateral - Description - Normal.    Assessment & Plan Emily Bookbinder MD; 02/15/2016 2:26 PM)  [place] (K43.0) Story: I think reviewing ct scan and examining patient as well as history this may very well be incarcerated incisional hernia. this is tender and has become increasingly so. she does have n/v. I think this likely just needs to be repaired. I have discussed with Dr Dalbert Batman and she will be directly admitted to [facility]. will repeat her ct scan to reassess this for any change as her symptoms have somewhat changed and she is difficult to examine. decision will be made after imaging. npo overnight

## 2016-02-15 NOTE — Progress Notes (Signed)
Surgery:  I have interviewed and examined this patient upon arrival at [facility] On exam she has a lot of anxiety. Her abdomen is obese but soft and nondistended.  Well-healed midline scar.  She is a focal area of exquisite tenderness in the lower midline but I do not feel an obvious mass.  CT scan from January 27 is reviewed revealing a small loop of small bowel caught in a ventral hernia but without obstruction or inflammatory change.  Lab work is pending  She will be made nothing by mouth IV fluids Analgesics Repeat CT stat  Consideration will be given to operative repair of her incisional hernia within the next 12-24 hours. She seems willing to proceed with this treatment pathway   Edsel Petrin. Dalbert Batman, M.D., [facility] General and Minimally invasive Surgery Breast and Colorectal Surgery Office:   [phone] Pager:   [phone]

## 2016-02-16 ENCOUNTER — Encounter (HOSPITAL_COMMUNITY): Payer: Self-pay | Admitting: Registered Nurse

## 2016-02-16 ENCOUNTER — Observation Stay (HOSPITAL_COMMUNITY): Payer: Medicare Other | Admitting: Anesthesiology

## 2016-02-16 ENCOUNTER — Encounter (HOSPITAL_COMMUNITY): Admission: AD | Disposition: A | Discharge status: Home / Self Care | Payer: Self-pay | Source: Ambulatory Visit

## 2016-02-16 DIAGNOSIS — F410 Panic disorder [episodic paroxysmal anxiety] without agoraphobia: Secondary | ICD-10-CM | POA: Diagnosis not present

## 2016-02-16 DIAGNOSIS — K219 Gastro-esophageal reflux disease without esophagitis: Secondary | ICD-10-CM | POA: Diagnosis not present

## 2016-02-16 DIAGNOSIS — G40909 Epilepsy, unspecified, not intractable, without status epilepticus: Secondary | ICD-10-CM | POA: Diagnosis not present

## 2016-02-16 DIAGNOSIS — F329 Major depressive disorder, single episode, unspecified: Secondary | ICD-10-CM | POA: Diagnosis not present

## 2016-02-16 DIAGNOSIS — F418 Other specified anxiety disorders: Secondary | ICD-10-CM | POA: Diagnosis not present

## 2016-02-16 DIAGNOSIS — K430 Incisional hernia with obstruction, without gangrene: Secondary | ICD-10-CM | POA: Diagnosis not present

## 2016-02-16 DIAGNOSIS — N281 Cyst of kidney, acquired: Secondary | ICD-10-CM | POA: Diagnosis not present

## 2016-02-16 DIAGNOSIS — F4310 Post-traumatic stress disorder, unspecified: Secondary | ICD-10-CM | POA: Diagnosis not present

## 2016-02-16 DIAGNOSIS — I10 Essential (primary) hypertension: Secondary | ICD-10-CM | POA: Diagnosis not present

## 2016-02-16 DIAGNOSIS — G8929 Other chronic pain: Secondary | ICD-10-CM | POA: Diagnosis not present

## 2016-02-16 DIAGNOSIS — G35 Multiple sclerosis: Secondary | ICD-10-CM | POA: Diagnosis not present

## 2016-02-16 DIAGNOSIS — F449 Dissociative and conversion disorder, unspecified: Secondary | ICD-10-CM | POA: Diagnosis not present

## 2016-02-16 DIAGNOSIS — G43109 Migraine with aura, not intractable, without status migrainosus: Secondary | ICD-10-CM | POA: Diagnosis not present

## 2016-02-16 HISTORY — PX: INCISIONAL HERNIA REPAIR: SHX193

## 2016-02-16 HISTORY — PX: INSERTION OF MESH: SHX5868

## 2016-02-16 LAB — CBC
HCT: 41.8 % (ref 36.0–46.0)
Hemoglobin: 14.1 g/dL (ref 12.0–15.0)
MCH: 27.8 pg (ref 26.0–34.0)
MCHC: 33.7 g/dL (ref 30.0–36.0)
MCV: 82.4 fL (ref 78.0–100.0)
PLATELETS: 404 10*3/uL — AB (ref 150–400)
RBC: 5.07 MIL/uL (ref 3.87–5.11)
RDW: 15.3 % (ref 11.5–15.5)
WBC: 14.9 10*3/uL — ABNORMAL HIGH (ref 4.0–10.5)

## 2016-02-16 LAB — SURGICAL PCR SCREEN
MRSA, PCR: NEGATIVE
Staphylococcus aureus: NEGATIVE

## 2016-02-16 LAB — CREATININE, SERUM
Creatinine, Ser: 0.73 mg/dL (ref 0.44–1.00)
GFR calc Af Amer: >60 mL/min (ref >60)
GFR calc non Af Amer: >60 mL/min (ref >60)

## 2016-02-16 SURGERY — REPAIR, HERNIA, INCISIONAL
Anesthesia: General | Site: Abdomen

## 2016-02-16 MED ORDER — FENTANYL CITRATE (PF) 100 MCG/2ML IJ SOLN
INTRAMUSCULAR | Status: AC
  Filled 2016-02-16: qty 4

## 2016-02-16 MED ORDER — METHOCARBAMOL 500 MG PO TABS
500.0000 mg | ORAL_TABLET | Freq: Four times a day (QID) | ORAL | Status: DC | PRN
Start: 2016-02-16 — End: 2016-02-18
  Administered 2016-02-17: 500 mg via ORAL
  Filled 2016-02-16: qty 1

## 2016-02-16 MED ORDER — HYDROMORPHONE HCL 1 MG/ML IJ SOLN
INTRAMUSCULAR | Status: AC
  Filled 2016-02-16: qty 1

## 2016-02-16 MED ORDER — MIDAZOLAM HCL 5 MG/5ML IJ SOLN
INTRAMUSCULAR | Status: DC | PRN
  Administered 2016-02-16: 2 mg via INTRAVENOUS

## 2016-02-16 MED ORDER — SODIUM CHLORIDE 0.9 % IJ SOLN
INTRAMUSCULAR | Status: AC
  Filled 2016-02-16: qty 100

## 2016-02-16 MED ORDER — LACTATED RINGERS IV SOLN
INTRAVENOUS | Status: DC | PRN
  Administered 2016-02-16: 12:00:00 via INTRAVENOUS

## 2016-02-16 MED ORDER — ONDANSETRON 4 MG PO TBDP
4.0000 mg | ORAL_TABLET | Freq: Four times a day (QID) | ORAL | Status: DC | PRN
  Administered 2016-02-18: 4 mg via ORAL
  Filled 2016-02-16: qty 1

## 2016-02-16 MED ORDER — POTASSIUM CHLORIDE 2 MEQ/ML IV SOLN
INTRAVENOUS | Status: DC
  Administered 2016-02-16 (×2): via INTRAVENOUS
  Filled 2016-02-16 (×3): qty 1000

## 2016-02-16 MED ORDER — FENTANYL CITRATE (PF) 100 MCG/2ML IJ SOLN
INTRAMUSCULAR | Status: AC
  Filled 2016-02-16: qty 2

## 2016-02-16 MED ORDER — PANTOPRAZOLE SODIUM 40 MG IV SOLR
40.0000 mg | Freq: Every day | INTRAVENOUS | Status: DC
  Administered 2016-02-16 – 2016-02-17 (×2): 40 mg via INTRAVENOUS
  Filled 2016-02-16 (×2): qty 40

## 2016-02-16 MED ORDER — BUPIVACAINE LIPOSOME 1.3 % IJ SUSP
20.0000 mL | Freq: Once | INTRAMUSCULAR | Status: AC
  Administered 2016-02-16: 20 mL
  Filled 2016-02-16: qty 20

## 2016-02-16 MED ORDER — OXYCODONE HCL 5 MG PO TABS
5.0000 mg | ORAL_TABLET | ORAL | Status: DC | PRN
  Administered 2016-02-17 – 2016-02-18 (×6): 10 mg via ORAL
  Filled 2016-02-16 (×6): qty 2

## 2016-02-16 MED ORDER — DEXTROSE 5 % IV SOLN
3.0000 g | Freq: Three times a day (TID) | INTRAVENOUS | Status: AC
  Administered 2016-02-16: 3 g via INTRAVENOUS
  Filled 2016-02-16: qty 3

## 2016-02-16 MED ORDER — ROCURONIUM BROMIDE 50 MG/5ML IV SOSY
PREFILLED_SYRINGE | INTRAVENOUS | Status: AC
  Filled 2016-02-16: qty 10

## 2016-02-16 MED ORDER — MORPHINE SULFATE (PF) 4 MG/ML IV SOLN
1.0000 mg | INTRAVENOUS | Status: DC | PRN
  Administered 2016-02-16 – 2016-02-17 (×4): 2 mg via INTRAVENOUS
  Filled 2016-02-16 (×4): qty 1

## 2016-02-16 MED ORDER — PROPOFOL 10 MG/ML IV BOLUS
INTRAVENOUS | Status: AC
  Filled 2016-02-16: qty 40

## 2016-02-16 MED ORDER — 0.9 % SODIUM CHLORIDE (POUR BTL) OPTIME
TOPICAL | Status: DC | PRN
  Administered 2016-02-16: 1000 mL

## 2016-02-16 MED ORDER — ENOXAPARIN SODIUM 40 MG/0.4ML ~~LOC~~ SOLN
40.0000 mg | SUBCUTANEOUS | Status: DC
  Administered 2016-02-17 – 2016-02-18 (×2): 40 mg via SUBCUTANEOUS
  Filled 2016-02-16 (×2): qty 0.4

## 2016-02-16 MED ORDER — FENTANYL CITRATE (PF) 100 MCG/2ML IJ SOLN
INTRAMUSCULAR | Status: DC | PRN
  Administered 2016-02-16 (×2): 50 ug via INTRAVENOUS
  Administered 2016-02-16: 150 ug via INTRAVENOUS
  Administered 2016-02-16: 50 ug via INTRAVENOUS

## 2016-02-16 MED ORDER — MEPERIDINE HCL 50 MG/ML IJ SOLN: 6.2500 mg | INTRAMUSCULAR | Status: DC | PRN

## 2016-02-16 MED ORDER — PROPOFOL 10 MG/ML IV BOLUS
INTRAVENOUS | Status: DC | PRN
  Administered 2016-02-16: 100 mg via INTRAVENOUS

## 2016-02-16 MED ORDER — SODIUM CHLORIDE 0.9 % IV SOLN
30.0000 meq | Freq: Once | INTRAVENOUS | Status: AC
  Administered 2016-02-16: 30 meq via INTRAVENOUS
  Filled 2016-02-16: qty 15

## 2016-02-16 MED ORDER — CHLORHEXIDINE GLUCONATE CLOTH 2 % EX PADS
6.0000 | MEDICATED_PAD | Freq: Once | CUTANEOUS | Status: AC
  Administered 2016-02-16: 6 via TOPICAL

## 2016-02-16 MED ORDER — MIDAZOLAM HCL 2 MG/2ML IJ SOLN
INTRAMUSCULAR | Status: AC
  Filled 2016-02-16: qty 2

## 2016-02-16 MED ORDER — CHLORHEXIDINE GLUCONATE CLOTH 2 % EX PADS: 6.0000 | MEDICATED_PAD | Freq: Once | CUTANEOUS | Status: DC

## 2016-02-16 MED ORDER — SUGAMMADEX SODIUM 200 MG/2ML IV SOLN
INTRAVENOUS | Status: DC | PRN
  Administered 2016-02-16: 500 mg via INTRAVENOUS

## 2016-02-16 MED ORDER — DEXAMETHASONE SODIUM PHOSPHATE 10 MG/ML IJ SOLN
INTRAMUSCULAR | Status: AC
  Filled 2016-02-16: qty 1

## 2016-02-16 MED ORDER — SODIUM CHLORIDE 0.9 % IJ SOLN
INTRAMUSCULAR | Status: DC | PRN
  Administered 2016-02-16: 20 mL

## 2016-02-16 MED ORDER — ONDANSETRON HCL 4 MG/2ML IJ SOLN
INTRAMUSCULAR | Status: AC
  Filled 2016-02-16: qty 2

## 2016-02-16 MED ORDER — ROCURONIUM BROMIDE 10 MG/ML (PF) SYRINGE
PREFILLED_SYRINGE | INTRAVENOUS | Status: DC | PRN
  Administered 2016-02-16: 50 mg via INTRAVENOUS
  Administered 2016-02-16: 20 mg via INTRAVENOUS

## 2016-02-16 MED ORDER — HYDROMORPHONE HCL 1 MG/ML IJ SOLN
0.2500 mg | INTRAMUSCULAR | Status: DC | PRN
  Administered 2016-02-16 (×4): 0.5 mg via INTRAVENOUS

## 2016-02-16 MED ORDER — LIDOCAINE 2% (20 MG/ML) 5 ML SYRINGE
INTRAMUSCULAR | Status: DC | PRN
  Administered 2016-02-16: 100 mg via INTRAVENOUS

## 2016-02-16 MED ORDER — ONDANSETRON HCL 4 MG/2ML IJ SOLN: 4.0000 mg | Freq: Four times a day (QID) | INTRAMUSCULAR | Status: DC | PRN

## 2016-02-16 MED ORDER — ONDANSETRON HCL 4 MG/2ML IJ SOLN: 4.0000 mg | Freq: Once | INTRAMUSCULAR | Status: DC | PRN

## 2016-02-16 MED ORDER — SUGAMMADEX SODIUM 500 MG/5ML IV SOLN
INTRAVENOUS | Status: AC
  Filled 2016-02-16: qty 5

## 2016-02-16 MED ORDER — SENNA 8.6 MG PO TABS
2.0000 | ORAL_TABLET | Freq: Two times a day (BID) | ORAL | Status: DC
  Administered 2016-02-16 – 2016-02-18 (×4): 17.2 mg via ORAL
  Filled 2016-02-16 (×4): qty 2

## 2016-02-16 MED ORDER — DEXTROSE 5 % IV SOLN
3.0000 g | INTRAVENOUS | Status: AC
  Administered 2016-02-16: 3 g via INTRAVENOUS
  Filled 2016-02-16: qty 3

## 2016-02-16 MED ORDER — POTASSIUM CHLORIDE 2 MEQ/ML IV SOLN
INTRAVENOUS | Status: DC
  Administered 2016-02-16 – 2016-02-17 (×4): via INTRAVENOUS
  Filled 2016-02-16 (×8): qty 1000

## 2016-02-16 SURGICAL SUPPLY — 39 items
APL SKNCLS STERI-STRIP NONHPOA (GAUZE/BANDAGES/DRESSINGS)
BENZOIN TINCTURE PRP APPL 2/3 (GAUZE/BANDAGES/DRESSINGS) IMPLANT
BINDER ABDOMINAL 12 ML 46-62 (SOFTGOODS) ×1 IMPLANT
COVER SURGICAL LIGHT HANDLE (MISCELLANEOUS) ×3 IMPLANT
DECANTER SPIKE VIAL GLASS SM (MISCELLANEOUS) ×3 IMPLANT
DEVICE SECURE STRAP 25 ABSORB (INSTRUMENTS) IMPLANT
DRAIN CHANNEL RND F F (WOUND CARE) ×3 IMPLANT
DRAPE LAPAROSCOPIC ABDOMINAL (DRAPES) ×3 IMPLANT
DRSG OPSITE POSTOP 4X10 (GAUZE/BANDAGES/DRESSINGS) ×1 IMPLANT
ELECT REM PT RETURN 9FT ADLT (ELECTROSURGICAL) ×3
ELECTRODE REM PT RTRN 9FT ADLT (ELECTROSURGICAL) ×2 IMPLANT
GAUZE SPONGE 4X4 12PLY STRL (GAUZE/BANDAGES/DRESSINGS) ×1 IMPLANT
GLOVE BIOGEL PI IND STRL 7.0 (GLOVE) ×2 IMPLANT
GLOVE BIOGEL PI INDICATOR 7.0 (GLOVE) ×1
GLOVE EUDERMIC 7 POWDERFREE (GLOVE) ×3 IMPLANT
GOWN STRL REUS W/TWL LRG LVL3 (GOWN DISPOSABLE) ×3 IMPLANT
GOWN STRL REUS W/TWL XL LVL3 (GOWN DISPOSABLE) ×6 IMPLANT
KIT BASIN OR (CUSTOM PROCEDURE TRAY) ×3 IMPLANT
MARKER SKIN DUAL TIP RULER LAB (MISCELLANEOUS) ×3 IMPLANT
MESH PROLENE 3X6 (Mesh General) ×1 IMPLANT
NDL SPNL 22GX3.5 QUINCKE BK (NEEDLE) ×2 IMPLANT
NEEDLE HYPO 22GX1.5 SAFETY (NEEDLE) ×1 IMPLANT
NEEDLE SPNL 22GX3.5 QUINCKE BK (NEEDLE) ×3 IMPLANT
NS IRRIG 1000ML POUR BTL (IV SOLUTION) ×3 IMPLANT
PACK GENERAL/GYN (CUSTOM PROCEDURE TRAY) ×3 IMPLANT
SOLUTION ANTI FOG 6CC (MISCELLANEOUS) ×3 IMPLANT
STAPLER VISISTAT 35W (STAPLE) ×3 IMPLANT
STRIP CLOSURE SKIN 1/2X4 (GAUZE/BANDAGES/DRESSINGS) IMPLANT
SUT ETHILON 3 0 PS 1 (SUTURE) ×2 IMPLANT
SUT MNCRL AB 4-0 PS2 18 (SUTURE) ×3 IMPLANT
SUT NOVA 0 T19/GS 22DT (SUTURE) ×6 IMPLANT
SUT NOVA 1 T20/GS 25DT (SUTURE) ×3 IMPLANT
SUT NOVA NAB GS-21 0 18 T12 DT (SUTURE) ×2 IMPLANT
SUT VIC AB 2-0 CT1 18 (SUTURE) ×1 IMPLANT
SYR BULB IRRIGATION 50ML (SYRINGE) ×1 IMPLANT
SYRINGE 20CC LL (MISCELLANEOUS) ×1 IMPLANT
TACKER 5MM HERNIA 3.5CML NAB (ENDOMECHANICALS) ×2 IMPLANT
TOWEL OR 17X26 10 PK STRL BLUE (TOWEL DISPOSABLE) ×3 IMPLANT
TRAY FOLEY CATH 14FRSI W/METER (CATHETERS) ×1 IMPLANT

## 2016-02-16 NOTE — Anesthesia Postprocedure Evaluation (Signed)
Anesthesia Post Note  Patient: Emily [name]  Procedure(s) Performed: Procedure(s) (LRB): HERNIA REPAIR INCISIONAL (N/A) INSERTION OF MESH  Patient location during evaluation: PACU Anesthesia Type: General Level of consciousness: awake and alert Pain management: pain level controlled Vital Signs Assessment: post-procedure vital signs reviewed and stable Respiratory status: spontaneous breathing, nonlabored ventilation, respiratory function stable and patient connected to nasal cannula oxygen Cardiovascular status: blood pressure returned to baseline and stable Postop Assessment: no signs of nausea or vomiting Anesthetic complications: no       Last Vitals:  Vitals:   02/16/16 1400 02/16/16 1419  BP: (!) 137/96 (!) 148/104  Pulse: 84 87  Resp: 10 14  Temp: 36.7 C 36.7 C    Last Pain:  Vitals:   02/16/16 1400  TempSrc:   PainSc: 5                  Demitrious Mccannon DAVID

## 2016-02-16 NOTE — Anesthesia Procedure Notes (Signed)
Procedure Name: Intubation Date/Time: 02/16/2016 12:08 PM Performed by: Talbot Grumbling Pre-anesthesia Checklist: Patient identified, Emergency Drugs available, Suction available and Patient being monitored Patient Re-evaluated:Patient Re-evaluated prior to inductionOxygen Delivery Method: Circle system utilized Preoxygenation: Pre-oxygenation with 100% oxygen Intubation Type: IV induction Ventilation: Mask ventilation without difficulty Laryngoscope Size: Mac and 3 Grade View: Grade I Tube type: Oral Tube size: 7.5 mm Number of attempts: 1 Airway Equipment and Method: Stylet Placement Confirmation: ETT inserted through vocal cords under direct vision,  positive ETCO2 and breath sounds checked- equal and bilateral Tube secured with: Tape Dental Injury: Teeth and Oropharynx as per pre-operative assessment

## 2016-02-16 NOTE — Anesthesia Preprocedure Evaluation (Signed)
Anesthesia Evaluation  Patient identified by MRN, date of birth, ID band Patient awake    Reviewed: Allergy & Precautions, NPO status , Patient's Chart, lab work & pertinent test results  Airway Mallampati: I  TM Distance: >3 FB Neck ROM: Full    Dental   Pulmonary former smoker,    Pulmonary exam normal        Cardiovascular hypertension, Pt. on medications Normal cardiovascular exam     Neuro/Psych Seizures -,  Anxiety Depression    GI/Hepatic GERD  Medicated and Controlled,  Endo/Other    Renal/GU      Musculoskeletal   Abdominal   Peds  Hematology   Anesthesia Other Findings   Reproductive/Obstetrics                             Anesthesia Physical Anesthesia Plan  ASA: II  Anesthesia Plan: General   Post-op Pain Management:    Induction: Intravenous  Airway Management Planned: Oral ETT  Additional Equipment:   Intra-op Plan:   Post-operative Plan: Extubation in OR  Informed Consent: I have reviewed the patients History and Physical, chart, labs and discussed the procedure including the risks, benefits and alternatives for the proposed anesthesia with the patient or authorized representative who has indicated his/her understanding and acceptance.     Plan Discussed with: CRNA and Surgeon  Anesthesia Plan Comments:         Anesthesia Quick Evaluation

## 2016-02-16 NOTE — Progress Notes (Signed)
Subjective: Had some anxiety, pain and nausea during night but no vomiting. CT scan unchanged from January 27.  Small incisional hernia lower midline with loop of nonobstructed small bowel.  No inflammation. She still having pain from this and so we agreed to proceed with open repair of the hernia at this time.  Objective: Vital signs in last 24 hours: Temp:  [97.9 F (36.6 C)-98.2 F (36.8 C)] 97.9 F (36.6 C) (02/09 0433) Pulse Rate:  [75-101] 75 (02/09 0433) Resp:  [17-18] 18 (02/09 0433) BP: (133-155)/(85-117) 133/85 (02/09 0433) SpO2:  [98 %-99 %] 98 % (02/09 0433) Weight:  [111.7 kg (246 lb 3 oz)] 111.7 kg (246 lb 3 oz) (02/08 1600) Last BM Date: 02/15/16  Intake/Output from previous day: 02/08 0701 - 02/09 0700 In: 1150 [I.V.:1150] Out: -  Intake/Output this shift: Total I/O In: 1125 [I.V.:1125] Out: -   General appearance: Alert and cooperative.  Other than anxiety, no distress.  Morbidly obese Resp: clear to auscultation bilaterally GI: Abdomen is soft and nontender for the most part.  Lower midline incision looks well-healed.  No skin inflammation.  Still tender to palpate incision and suprapubic area but don't really feel a hernia bulge.  Lab Results:   Recent Labs  02/15/16 1950  WBC 11.0*  HGB 12.9  HCT 38.1  PLT 384   BMET  Recent Labs  02/15/16 1950  NA 135  K 2.8*  CL 105  CO2 22  GLUCOSE 92  BUN 6  CREATININE 0.50  CALCIUM 8.4*   PT/INR No results for input(s): LABPROT, INR in the last 72 hours. ABG No results for input(s): PHART, HCO3 in the last 72 hours.  Invalid input(s): PCO2, PO2  Studies/Results: Ct Abdomen Pelvis W Contrast  Result Date: 02/15/2016 CLINICAL DATA:  Focal area of tenderness in the lower midline abdomen without obvious mass. EXAM: CT ABDOMEN AND PELVIS WITH CONTRAST TECHNIQUE: Multidetector CT imaging of the abdomen and pelvis was performed using the standard protocol following bolus administration of  intravenous contrast. CONTRAST:  50mL ISOVUE-300 IOPAMIDOL (ISOVUE-300) INJECTION 61%, 159mL ISOVUE-300 IOPAMIDOL (ISOVUE-300) INJECTION 61% COMPARISON:  02/03/2016 CT FINDINGS: Lower chest: No acute abnormality. Hepatobiliary: No focal liver abnormality is seen. No gallstones, gallbladder wall thickening, or biliary dilatation. Pancreas: Unremarkable. No pancreatic ductal dilatation or surrounding inflammatory changes. Spleen: Normal in size without focal abnormality. Adrenals/Urinary Tract: The adrenal glands are unremarkable. Right upper pole 1.4 cm renal cyst associated with a nonobstructing stone is again noted. No hydronephrosis on either side. Urinary bladder is unremarkable. Stomach/Bowel: Contrast distended stomach. Normal small bowel rotation without of acute obstruction or inflammation. Infraumbilical small bowel containing hernia without strangulation. Contrast is noted within the short segment of bowel entering as well as exiting this ventral hernia. This hernia measures 1.7 cm transverse by 1.4 cm craniocaudad. Vascular/Lymphatic: No significant vascular findings are present. No enlarged abdominal or pelvic lymph nodes. Reproductive: Uterus and bilateral adnexa are unremarkable. Other: Midline ventral surgical scar. Musculoskeletal: No acute or significant osseous findings. IMPRESSION: Re- demonstration of a nonobstructing infraumbilical ventral hernia containing a short segment of nonincarcerated small bowel. No acute bowel inflammation or obstruction. Electronically Signed   By: Ashley Royalty M.D.   On: 02/15/2016 19:08    Anti-infectives: Anti-infectives    None      Assessment/Plan:    Symptomatic recurrent incisional hernia.  Proceed with open repair today, possibly with mesh I discussed the indications, details, techniques, and numerous risk of the surgery with her.  She's  aware the risk of wound infection, deep space infection, bleeding, intestinal injury requiring resection, remote  possibility of ostomy.  She's aware that there is a possibility the hernia will recur in the future and we discussed risk factors for that.  Remote possibility of cardiac, pulmonary, or thromboembolic problems.  She understands these issues well.  All of her questions are answered.  She fully agrees with this plan.  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  History of scar revision and ventral hernia repair without mesh in 2013 by plastic surgery  History emergency C-section with hemoperitoneum ( placental abruption ) requiring general surgery consult last year.  Running PDS closure.  Anxiety disorder with depression GERD Hypertension Multiple sclerosis Morbid obesity Seizure disorder   LOS: 1 day    Monte Zinni M 02/16/2016

## 2016-02-16 NOTE — Op Note (Signed)
Patient Name:           Emily [name]   Date of Surgery:        02/16/2016  Pre op Diagnosis:      Recurrent, incarcerated, ventral incisional hernia  Post op Diagnosis:    Same  Procedure:                 Repair recurrent incarcerated ventral incisional hernia with mesh  Surgeon:                     Edsel Petrin. Dalbert Batman, M.D., FACS  Assistant:                      OR staff   Indication for Assistant: n/a  Operative Indications:   This is a 36 year old female with a history of multiple sclerosis, anxiety and depression, hypertension, morbid obesity, seizure disorder.  She has a history of a two-stage colon resection for perforation several years ago which she says was due to immunosuppressive  medication.  She subsequently had scar revision and a primary ventral hernia repair by one of the plastic surgeons here in town.  She says she subsequently had an emergency C-section for placental abruption, complicated by hemorrhage.  That was also a midline incision closed with a running PDS suture.  She presented to the emergency room on 02/03/2016 with suprapubic pain and tenderness and was found to have a small ventral hernia in the lower midline with a loop of small bowel in the hernia but was not obstructed.  She went home.  She presented to our office yesterday with pain but no nausea or vomiting.  She was crying and uncomfortable in the office.  She was admitted to the hospital last night and we repeated her CT scan which shows similar findings, a loop of small bowel in a small lower midline ventral hernia but no obstruction or inflammatory change.  She requested that we proceed with repair of the hernia  Operative Findings:       There was a chronic ventral incisional hernia in the lower midline suprapubic area.  There was another smaller defect.  There was a loop of small bowel in the primary defect but the adhesions were minimal and this was easy to release.  Small bowel was not ischemic or inflamed  and not obstructed.  I was able to pass my finger about 8 cm superiorly and inferiorly and felt no other hernias.  There did not seem to be many intra-abdominal adhesions.  Procedure in Detail:          Following the induction of general endotracheal anesthesia Foley catheter was placed.  The abdomen and pubic areas were prepped and draped in a sterile fashion.  Intravenous antibiotics were given.  Surgical timeout was performed.     A lower midline incision was made.  Dissection was carried down through skin and subcutaneous tissue.  I slowly encountered a hernia sac and lifted this up and opened it.  I could then see a hernia sac about 6 cm in diameter.  I continue to open the incision up above and below.  The fascia was incised inferiorly to incorporate a smaller hernia in the lower midline.  The hernia sac was debrided from the subcutaneous tissues.  The small bowel was returned to the abdominal cavity.  I explored the abdomen and found no other pathology.  I undermined the subcutaneous tissue circumferentially.  The midline fascia  defect was about 3-4 inches in length.  was closed the midline fascia with interrupted sutures of #1 Novafil.  I placed an onlay polypropylene mesh patch, 3" x 6" and sutured in this in place with multiple interrupted mattress sutures of 0 Novofil.     The wound was irrigated    Ultimately, I injected a 50% solution of exparel into the muscle layers, subcutaneous tissue and skin as a local infiltration anesthetic.     A 19 [place] Blake drain was placed in the wound and brought out through a separate stab incision in the midline above the incision through the old scar.  This was sutured in place and connected to a suction bulb.  Subcutaneous tissues were closed with interrupted 2-0 Vicryl and the skin closed with skin staples.  Clean bandages and an abdominal binder was placed.  Patient tolerated the procedure well and was taken to PACU in stable condition.  EBL 30-50 mL.  Counts  correct.  Complications none.          Edsel Petrin. Dalbert Batman, M.D., FACS General and Minimally Invasive Surgery Breast and Colorectal Surgery  02/16/2016 1:30 PM

## 2016-02-16 NOTE — Transfer of Care (Signed)
Immediate Anesthesia Transfer of Care Note  Patient: Emily [name]  Procedure(s) Performed: Procedure(s): HERNIA REPAIR INCISIONAL (N/A) INSERTION OF MESH  Patient Location: PACU  Anesthesia Type:General  Level of Consciousness:  sedated, patient cooperative and responds to stimulation  Airway & Oxygen Therapy:Patient Spontanous Breathing and Patient connected to face mask oxgen  Post-op Assessment:  Report given to PACU RN and Post -op Vital signs reviewed and stable  Post vital signs:  Reviewed and stable  Last Vitals:  Vitals:   02/15/16 2155 02/16/16 0433  BP: (!) 152/99 133/85  Pulse: 82 75  Resp: 17 18  Temp: 36.8 C [id number] C    Complications: No apparent anesthesia complications

## 2016-02-17 DIAGNOSIS — G35 Multiple sclerosis: Secondary | ICD-10-CM | POA: Diagnosis not present

## 2016-02-17 DIAGNOSIS — F410 Panic disorder [episodic paroxysmal anxiety] without agoraphobia: Secondary | ICD-10-CM | POA: Diagnosis not present

## 2016-02-17 DIAGNOSIS — G40909 Epilepsy, unspecified, not intractable, without status epilepticus: Secondary | ICD-10-CM | POA: Diagnosis not present

## 2016-02-17 DIAGNOSIS — I10 Essential (primary) hypertension: Secondary | ICD-10-CM | POA: Diagnosis not present

## 2016-02-17 DIAGNOSIS — K430 Incisional hernia with obstruction, without gangrene: Secondary | ICD-10-CM | POA: Diagnosis not present

## 2016-02-17 DIAGNOSIS — G8929 Other chronic pain: Secondary | ICD-10-CM | POA: Diagnosis not present

## 2016-02-17 LAB — CBC
HEMATOCRIT: 39.8 % (ref 36.0–46.0)
Hemoglobin: 13.6 g/dL (ref 12.0–15.0)
MCH: 27.5 pg (ref 26.0–34.0)
MCHC: 34.2 g/dL (ref 30.0–36.0)
MCV: 80.6 fL (ref 78.0–100.0)
Platelets: 440 10*3/uL — ABNORMAL HIGH (ref 150–400)
RBC: 4.94 MIL/uL (ref 3.87–5.11)
RDW: 15 % (ref 11.5–15.5)
WBC: 15.6 10*3/uL — ABNORMAL HIGH (ref 4.0–10.5)

## 2016-02-17 LAB — BASIC METABOLIC PANEL
Anion gap: 13 (ref 5–15)
BUN: 9 mg/dL (ref 6–20)
CO2: 14 mmol/L — AB (ref 22–32)
Calcium: 9.2 mg/dL (ref 8.9–10.3)
Chloride: 113 mmol/L — ABNORMAL HIGH (ref 101–111)
Creatinine, Ser: 0.81 mg/dL (ref 0.44–1.00)
GFR calc Af Amer: >60 mL/min (ref >60)
Glucose, Bld: 127 mg/dL — ABNORMAL HIGH (ref 65–99)
POTASSIUM: 4.3 mmol/L (ref 3.5–5.1)
Sodium: 140 mmol/L (ref 135–145)

## 2016-02-17 NOTE — Progress Notes (Signed)
1 Day Post-Op  Subjective: Complains of some pain. Tolerating clears  Objective: Vital signs in last 24 hours: Temp:  [97.8 F (36.6 C)-98.2 F (36.8 C)] 98.1 F (36.7 C) (02/10 0540) Pulse Rate:  [83-102] 83 (02/10 0540) Resp:  [10-18] 18 (02/10 0540) BP: (122-175)/(65-104) 142/94 (02/10 0540) SpO2:  [97 %-100 %] 99 % (02/10 0540) Last BM Date: 02/15/16  Intake/Output from previous day: 02/09 0701 - 02/10 0700 In: 3097.1 [P.O.:540; I.V.:2557.1] Out: 3200 [Urine:3100; Drains:50; Blood:50] Intake/Output this shift: Total I/O In: -  Out: 20 [Drains:20]  Resp: clear to auscultation bilaterally Cardio: regular rate and rhythm GI: soft, appropriately tender. incision looks good  Lab Results:   Recent Labs  02/16/16 1502 02/17/16 0515  WBC 14.9* 15.6*  HGB 14.1 13.6  HCT 41.8 39.8  PLT 404* 440*   BMET  Recent Labs  02/15/16 1950 02/16/16 1502  NA 135  --   K 2.8*  --   CL 105  --   CO2 22  --   GLUCOSE 92  --   BUN 6  --   CREATININE 0.50 0.73  CALCIUM 8.4*  --    PT/INR No results for input(s): LABPROT, INR in the last 72 hours. ABG No results for input(s): PHART, HCO3 in the last 72 hours.  Invalid input(s): PCO2, PO2  Studies/Results: Ct Abdomen Pelvis W Contrast  Result Date: 02/15/2016 CLINICAL DATA:  Focal area of tenderness in the lower midline abdomen without obvious mass. EXAM: CT ABDOMEN AND PELVIS WITH CONTRAST TECHNIQUE: Multidetector CT imaging of the abdomen and pelvis was performed using the standard protocol following bolus administration of intravenous contrast. CONTRAST:  8mL ISOVUE-300 IOPAMIDOL (ISOVUE-300) INJECTION 61%, 164mL ISOVUE-300 IOPAMIDOL (ISOVUE-300) INJECTION 61% COMPARISON:  02/03/2016 CT FINDINGS: Lower chest: No acute abnormality. Hepatobiliary: No focal liver abnormality is seen. No gallstones, gallbladder wall thickening, or biliary dilatation. Pancreas: Unremarkable. No pancreatic ductal dilatation or surrounding  inflammatory changes. Spleen: Normal in size without focal abnormality. Adrenals/Urinary Tract: The adrenal glands are unremarkable. Right upper pole 1.4 cm renal cyst associated with a nonobstructing stone is again noted. No hydronephrosis on either side. Urinary bladder is unremarkable. Stomach/Bowel: Contrast distended stomach. Normal small bowel rotation without of acute obstruction or inflammation. Infraumbilical small bowel containing hernia without strangulation. Contrast is noted within the short segment of bowel entering as well as exiting this ventral hernia. This hernia measures 1.7 cm transverse by 1.4 cm craniocaudad. Vascular/Lymphatic: No significant vascular findings are present. No enlarged abdominal or pelvic lymph nodes. Reproductive: Uterus and bilateral adnexa are unremarkable. Other: Midline ventral surgical scar. Musculoskeletal: No acute or significant osseous findings. IMPRESSION: Re- demonstration of a nonobstructing infraumbilical ventral hernia containing a short segment of nonincarcerated small bowel. No acute bowel inflammation or obstruction. Electronically Signed   By: Ashley Royalty M.D.   On: 02/15/2016 19:08    Anti-infectives: Anti-infectives    Start     Dose/Rate Route Frequency Ordered Stop   02/16/16 2000  ceFAZolin (ANCEF) 3 g in dextrose 5 % 50 mL IVPB     3 g 130 mL/hr over 30 Minutes Intravenous Every 8 hours 02/16/16 1423 02/16/16 2006   02/16/16 0715  ceFAZolin (ANCEF) 3 g in dextrose 5 % 50 mL IVPB     3 g 130 mL/hr over 30 Minutes Intravenous On call to O.R. 02/16/16 0713 02/16/16 1210      Assessment/Plan: s/p Procedure(s): HERNIA REPAIR INCISIONAL (N/A) INSERTION OF MESH Advance diet  Continue to work on  pain control. Hopefully ready for discharge tomorrow  LOS: 1 day    TOTH III,PAUL S 02/17/2016

## 2016-02-18 DIAGNOSIS — F410 Panic disorder [episodic paroxysmal anxiety] without agoraphobia: Secondary | ICD-10-CM | POA: Diagnosis not present

## 2016-02-18 DIAGNOSIS — G35 Multiple sclerosis: Secondary | ICD-10-CM | POA: Diagnosis not present

## 2016-02-18 DIAGNOSIS — I10 Essential (primary) hypertension: Secondary | ICD-10-CM | POA: Diagnosis not present

## 2016-02-18 DIAGNOSIS — G8929 Other chronic pain: Secondary | ICD-10-CM | POA: Diagnosis not present

## 2016-02-18 DIAGNOSIS — K430 Incisional hernia with obstruction, without gangrene: Secondary | ICD-10-CM | POA: Diagnosis not present

## 2016-02-18 DIAGNOSIS — G40909 Epilepsy, unspecified, not intractable, without status epilepticus: Secondary | ICD-10-CM | POA: Diagnosis not present

## 2016-02-18 MED ORDER — PANTOPRAZOLE SODIUM 40 MG PO TBEC: 40.0000 mg | DELAYED_RELEASE_TABLET | Freq: Every day | ORAL | Status: DC

## 2016-02-18 MED ORDER — OXYCODONE-ACETAMINOPHEN 5-325 MG PO TABS: 1.0000 | ORAL_TABLET | ORAL | 0 refills | Status: DC | PRN

## 2016-02-18 NOTE — Progress Notes (Signed)
Pt is discharged to home. DC instructions given with husband at bedside. No concerns voiced. Prescription x 1 given for pain med. Left unit ambulatorily accompanied by husband. Pt referred to walk and would not wait for tech to escort downstairs. Left in good condition. VWilliams,rn.

## 2016-02-18 NOTE — Progress Notes (Signed)
Pt's IV was beeping constantly because it was in her right AC.Pt has been drinking plenty of fluids. She stated she is going home today.  SL IV pt so she could sleep.

## 2016-02-18 NOTE — Progress Notes (Signed)
2 Days Post-Op  Subjective: Feels better. Complains only of some mild soreness. Tolerating diet  Objective: Vital signs in last 24 hours: Temp:  [97.8 F (36.6 C)-98.6 F (37 C)] 98.3 F (36.8 C) (02/11 0537) Pulse Rate:  [67-91] 76 (02/11 0537) Resp:  [16-18] 16 (02/11 0537) BP: (130-152)/(76-90) 130/80 (02/11 0537) SpO2:  [98 %-99 %] 99 % (02/11 0537) Last BM Date: 02/15/16  Intake/Output from previous day: 02/10 0701 - 02/11 0700 In: 4010 [P.O.:1760; I.V.:2250] Out: 2875 [Urine:2800; Drains:75] Intake/Output this shift: No intake/output data recorded.  Resp: clear to auscultation bilaterally Cardio: regular rate and rhythm GI: soft, mild tenderness. drain output serosanguinous  Lab Results:   Recent Labs  02/16/16 1502 02/17/16 0515  WBC 14.9* 15.6*  HGB 14.1 13.6  HCT 41.8 39.8  PLT 404* 440*   BMET  Recent Labs  02/15/16 1950 02/16/16 1502 02/17/16 0515  NA 135  --  140  K 2.8*  --  4.3  CL 105  --  113*  CO2 22  --  14*  GLUCOSE 92  --  127*  BUN 6  --  9  CREATININE 0.50 0.73 0.81  CALCIUM 8.4*  --  9.2   PT/INR No results for input(s): LABPROT, INR in the last 72 hours. ABG No results for input(s): PHART, HCO3 in the last 72 hours.  Invalid input(s): PCO2, PO2  Studies/Results: No results found.  Anti-infectives: Anti-infectives    Start     Dose/Rate Route Frequency Ordered Stop   02/16/16 2000  ceFAZolin (ANCEF) 3 g in dextrose 5 % 50 mL IVPB     3 g 130 mL/hr over 30 Minutes Intravenous Every 8 hours 02/16/16 1423 02/16/16 2006   02/16/16 0715  ceFAZolin (ANCEF) 3 g in dextrose 5 % 50 mL IVPB     3 g 130 mL/hr over 30 Minutes Intravenous On call to O.R. 02/16/16 0713 02/16/16 1210      Assessment/Plan: s/p Procedure(s): HERNIA REPAIR INCISIONAL (N/A) INSERTION OF MESH Advance diet Discharge  LOS: 1 day    TOTH III,Tayjah Lobdell S 02/18/2016

## 2016-02-19 ENCOUNTER — Encounter: Payer: Self-pay | Admitting: Neurology

## 2016-02-25 NOTE — Discharge Summary (Signed)
Physician Discharge Summary  Patient ID: Emily [name] MRN: [id number] DOB/AGE: April 17, 1980 36 y.o.  Admit date: 02/15/2016 Discharge date: 02/25/2016  Admission Diagnoses:  Recurrent, incarcerated, ventral incisional hernia Multiple medical allergies History of chronic pain History of conversion disorder, PTSD , panic attacks, depression and anxiety History of migraines Hypertension Multiple medical allergies.  Discharge Diagnoses:  Recurrent, incarcerated, ventral incisional hernia Multiple medical allergies History of chronic pain History of conversion disorder, PTSD , panic attacks, depression and anxiety History of migraines Hypertension Multiple medical allergies.    Principal Problem:   Incisional hernia Active Problems:   Incarcerated incisional hernia   PROCEDURES:  Repair recurrent incarcerated ventral incisional hernia with mesh, 02/16/16,  Edsel Petrin. Dalbert Batman, M.D., [facility] Course: The patient is a 36 year old female who presents with an incisional hernia. 67 yof with ms who has prior surgical history of colectomy (due to ms meds) with colostomy, colostomy takedown with appy in 2008-2009. she later was taken to or in 2013 by plastic surgery for continued wound drainage and underwent what is reported as primary repair of incisional hernia. In July she had a emergency c/s. she was noted to have hemoperitoneum at that time a general surgery consult. she was noted to have adhesions and was closed primarily. she had acute pain end of january and was seen in the er. she was noted to have a low incisional hernia. this has continued to be tender increasingly so. she has n/v, she is passing flatus. she is umcomfortable and crying when I walk in the room.  She was seen and admitted to Dr. Donne Hazel. It was his opinion she may well have an incarcerated incisional hernia. On exam she had a well-healed midline scar with and or firmness at the lower aspect. She was barely  able to tolerate palpation of this area. He could not tell this was reducible and she was admitted because of the exam results.  She was seen the following a.m. and taken to the operating room later that day by Dr. Fanny Skates the above-noted procedure. She tolerated the procedure well. Her diet was advanced and on the second postoperative day she was seen by Dr. Autumn Messing who discharged her home. I did not see or participate in the care of this patient. Dictation is strictly from the chart.  CBC Latest Ref Rng & Units 02/17/2016 02/16/2016 02/15/2016  WBC 4.0 - 10.5 K/uL 15.6(H) 14.9(H) 11.0(H)  Hemoglobin 12.0 - 15.0 g/dL 13.6 14.1 12.9  Hematocrit 36.0 - 46.0 % 39.8 41.8 38.1  Platelets 150 - 400 K/uL 440(H) 404(H) 384   CMP Latest Ref Rng & Units 02/17/2016 02/16/2016 02/15/2016  Glucose 65 - 99 mg/dL 127(H) - 92  BUN 6 - 20 mg/dL 9 - 6  Creatinine 0.44 - 1.00 mg/dL 0.81 0.73 0.50  Sodium 135 - 145 mmol/L 140 - 135  Potassium 3.5 - 5.1 mmol/L 4.3 - 2.8(L)  Chloride 101 - 111 mmol/L 113(H) - 105  CO2 22 - 32 mmol/L 14(L) - 22  Calcium 8.9 - 10.3 mg/dL 9.2 - 8.4(L)  Total Protein 6.1 - 8.1 g/dL - - -  Total Bilirubin 0.2 - 1.2 mg/dL - - -  Alkaline Phos 33 - 115 U/L - - -  AST 10 - 30 U/L - - -  ALT 6 - 29 U/L - - -   Condition ON discharge: Improved  Disposition: 01-Home or Self Care  Discharge Instructions    Call MD for:  difficulty  breathing, headache or visual disturbances    Complete by:  As directed    Call MD for:  extreme fatigue    Complete by:  As directed    Call MD for:  hives    Complete by:  As directed    Call MD for:  persistant dizziness or light-headedness    Complete by:  As directed    Call MD for:  persistant nausea and vomiting    Complete by:  As directed    Call MD for:  redness, tenderness, or signs of infection (pain, swelling, redness, odor or green/yellow discharge around incision site)    Complete by:  As directed    Call MD for:  severe uncontrolled  pain    Complete by:  As directed    Call MD for:  temperature >100.4    Complete by:  As directed    Diet - low sodium heart healthy    Complete by:  As directed    Discharge instructions    Complete by:  As directed    Sponge bathe while drain is in. Diet as tolerated. Empty drain, record output, and recharge bulb twice a day   Increase activity slowly    Complete by:  As directed    No wound care    Complete by:  As directed      Allergies as of 02/18/2016      Reactions   Amitriptyline Hypertension, Other (See Comments)   hypertension   Baclofen Hives, Shortness Of Breath   Cymbalta [duloxetine Hcl] Shortness Of Breath, Rash   Gabapentin Shortness Of Breath, Rash   Monosodium Glutamate Anaphylaxis   Other Shortness Of Breath, Rash   MSG, beans (vomiting)   Vicodin [hydrocodone-acetaminophen] Hives, Nausea And Vomiting   Projectile vomiting   Alprazolam Other (See Comments)   Lethargy   Magnesium Salicylate Hives, Itching   Rizatriptan Nausea And Vomiting, Other (See Comments)   GI upset, Projectile vomiting   Tizanidine Hives   Tramadol Other (See Comments)   Unspecified   Adhesive [tape] Other (See Comments)   Skin irritation Paper tape is ok   Lamotrigine Rash   Toradol [ketorolac Tromethamine] Nausea Only      Medication List    TAKE these medications   albuterol 108 (90 Base) MCG/ACT inhaler Commonly known as:  PROVENTIL HFA;VENTOLIN HFA Inhale 2 puffs into the lungs every 6 (six) hours as needed for wheezing or shortness of breath.   amLODipine 5 MG tablet Commonly known as:  NORVASC Take 5 mg by mouth every evening.   busPIRone 15 MG tablet Commonly known as:  BUSPAR Take 1 tablet (15 mg total) by mouth 3 (three) times daily.   cyclobenzaprine 10 MG tablet Commonly known as:  FLEXERIL TAKE 1 TABLET BY MOUTH 3 TIMES A DAY AS NEEDED FOR MUSCLE SPASMS   diazepam 5 MG tablet Commonly known as:  VALIUM Take 1 tablet (5 mg total) by mouth daily. Take  1 tablet as needed for panic attacks leading to seizures.   diphenhydrAMINE 25 MG tablet Commonly known as:  SOMINEX Take 25 mg by mouth at bedtime as needed for sleep.   divalproex 500 MG DR tablet Commonly known as:  DEPAKOTE Take 1 tablet (500 mg total) by mouth at bedtime.   EPIPEN 0.3 mg/0.3 mL Soaj injection Generic drug:  EPINEPHrine Inject 0.3 mg into the muscle as needed (allergic reaction). Reported on 03/28/2015   FLUoxetine HCl 60 MG Tabs Take 60 mg by  mouth daily.   hydrochlorothiazide 25 MG tablet Commonly known as:  HYDRODIURIL Take 1 tablet (25 mg total) by mouth daily.   ibuprofen 200 MG tablet Commonly known as:  ADVIL,MOTRIN Take 600 mg by mouth every 6 (six) hours as needed for headache or moderate pain.   ibuprofen 600 MG tablet Commonly known as:  ADVIL,MOTRIN Take 1 tablet (600 mg total) by mouth every 6 (six) hours as needed for moderate pain. Do not take on an empty stomach.   methocarbamol 500 MG tablet Commonly known as:  ROBAXIN Take 500 mg by mouth 2 (two) times daily as needed for muscle spasms.   metoCLOPramide 10 MG tablet Commonly known as:  REGLAN TAKE 1 TABLET AS NEEDED 2-3 TIMES A WEEK.   MULTIVITAMIN ADULT PO Take 1 tablet by mouth daily.   oxyCODONE-acetaminophen 5-325 MG tablet Commonly known as:  ROXICET Take 1-2 tablets by mouth every 4 (four) hours as needed for severe pain.   promethazine 25 MG tablet Commonly known as:  PHENERGAN Take 0.5-1 tablets (12.5-25 mg total) by mouth every 6 (six) hours as needed for nausea.   ranitidine 300 MG capsule Commonly known as:  ZANTAC Take 300 mg by mouth at bedtime. Reported on 07/21/2015   simethicone 80 MG chewable tablet Commonly known as:  MYLICON Chew 1 tablet (80 mg total) by mouth 4 (four) times daily as needed for flatulence.   SUMAtriptan 100 MG tablet Commonly known as:  IMITREX Take 1 tablet (100 mg total) by mouth every 2 (two) hours as needed for migraine. May repeat  in 2 hours if headache persists or recurs.   topiramate 50 MG tablet Commonly known as:  TOPAMAX Take 1 tablet (50 mg total) by mouth 2 (two) times daily.      Follow-up Information    Adin Hector, MD Follow up.   Specialty:  General Surgery Contact information: [address] [place] [address] [phone]           Signed: Earnstine Regal 02/25/2016, 11:23 AM

## 2016-02-29 ENCOUNTER — Encounter (HOSPITAL_COMMUNITY): Payer: Self-pay | Admitting: Psychiatry

## 2016-02-29 ENCOUNTER — Ambulatory Visit (INDEPENDENT_AMBULATORY_CARE_PROVIDER_SITE_OTHER): Payer: Medicare Other | Admitting: Psychiatry

## 2016-02-29 DIAGNOSIS — Z9109 Other allergy status, other than to drugs and biological substances: Secondary | ICD-10-CM | POA: Diagnosis not present

## 2016-02-29 DIAGNOSIS — Z888 Allergy status to other drugs, medicaments and biological substances status: Secondary | ICD-10-CM | POA: Diagnosis not present

## 2016-02-29 DIAGNOSIS — F4001 Agoraphobia with panic disorder: Secondary | ICD-10-CM

## 2016-02-29 DIAGNOSIS — Z833 Family history of diabetes mellitus: Secondary | ICD-10-CM | POA: Diagnosis not present

## 2016-02-29 DIAGNOSIS — F445 Conversion disorder with seizures or convulsions: Secondary | ICD-10-CM

## 2016-02-29 DIAGNOSIS — Z87891 Personal history of nicotine dependence: Secondary | ICD-10-CM

## 2016-02-29 DIAGNOSIS — F411 Generalized anxiety disorder: Secondary | ICD-10-CM | POA: Diagnosis not present

## 2016-02-29 DIAGNOSIS — Z9889 Other specified postprocedural states: Secondary | ICD-10-CM

## 2016-02-29 DIAGNOSIS — F332 Major depressive disorder, recurrent severe without psychotic features: Secondary | ICD-10-CM

## 2016-02-29 DIAGNOSIS — Z79899 Other long term (current) drug therapy: Secondary | ICD-10-CM

## 2016-02-29 DIAGNOSIS — Z8261 Family history of arthritis: Secondary | ICD-10-CM

## 2016-02-29 DIAGNOSIS — Z809 Family history of malignant neoplasm, unspecified: Secondary | ICD-10-CM | POA: Diagnosis not present

## 2016-02-29 DIAGNOSIS — Z8249 Family history of ischemic heart disease and other diseases of the circulatory system: Secondary | ICD-10-CM

## 2016-02-29 MED ORDER — DIPHENHYDRAMINE HCL (SLEEP) 25 MG PO TABS: 25.0000 mg | ORAL_TABLET | Freq: Every evening | ORAL | 2 refills | Status: DC | PRN

## 2016-02-29 MED ORDER — FLUOXETINE HCL 40 MG PO CAPS: 80.0000 mg | ORAL_CAPSULE | Freq: Every day | ORAL | 2 refills | Status: DC

## 2016-02-29 MED ORDER — BUSPIRONE HCL 15 MG PO TABS: 15.0000 mg | ORAL_TABLET | Freq: Three times a day (TID) | ORAL | 2 refills | Status: DC

## 2016-02-29 MED ORDER — DIAZEPAM 5 MG PO TABS: 5.0000 mg | ORAL_TABLET | Freq: Every day | ORAL | 2 refills | Status: DC
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
## 2016-03-12 ENCOUNTER — Emergency Department (HOSPITAL_COMMUNITY)
Admission: EM | Admit: 2016-03-12 | Discharge: 2016-03-12 | Disposition: A | Discharge status: Home / Self Care | Payer: Medicare Other | Attending: Emergency Medicine | Admitting: Emergency Medicine

## 2016-03-12 ENCOUNTER — Encounter (HOSPITAL_COMMUNITY): Payer: Self-pay | Admitting: *Deleted

## 2016-03-12 ENCOUNTER — Emergency Department (HOSPITAL_COMMUNITY): Payer: Medicare Other

## 2016-03-12 DIAGNOSIS — Y929 Unspecified place or not applicable: Secondary | ICD-10-CM | POA: Insufficient documentation

## 2016-03-12 DIAGNOSIS — Y939 Activity, unspecified: Secondary | ICD-10-CM | POA: Diagnosis not present

## 2016-03-12 DIAGNOSIS — J45909 Unspecified asthma, uncomplicated: Secondary | ICD-10-CM | POA: Diagnosis not present

## 2016-03-12 DIAGNOSIS — S0990XA Unspecified injury of head, initial encounter: Secondary | ICD-10-CM | POA: Insufficient documentation

## 2016-03-12 DIAGNOSIS — I10 Essential (primary) hypertension: Secondary | ICD-10-CM | POA: Insufficient documentation

## 2016-03-12 DIAGNOSIS — Z87891 Personal history of nicotine dependence: Secondary | ICD-10-CM | POA: Insufficient documentation

## 2016-03-12 DIAGNOSIS — Y999 Unspecified external cause status: Secondary | ICD-10-CM | POA: Diagnosis not present

## 2016-03-12 DIAGNOSIS — R51 Headache: Secondary | ICD-10-CM | POA: Diagnosis not present
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
## 2016-03-12 NOTE — ED Notes (Signed)
Pt taken to ct 

## 2016-03-12 NOTE — ED Notes (Signed)
Pt returned from ct
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
## 2016-03-13 ENCOUNTER — Telehealth: Payer: Self-pay | Admitting: Neurology

## 2016-03-13 MED ORDER — METOCLOPRAMIDE HCL 10 MG PO TABS: 10.0000 mg | ORAL_TABLET | ORAL | 2 refills | Status: DC | PRN

## 2016-03-13 NOTE — Telephone Encounter (Signed)
Emily [name] 05/26/1980. Her # [phone]. Needs a prescription for anti nausea medication (Metoclopramide). She would like that filled please. She uses [facility] in [place]. Thank you

## 2016-03-13 NOTE — Telephone Encounter (Signed)
Patient made aware. RX sent to pharmacy.  

## 2016-03-13 NOTE — Telephone Encounter (Signed)
Spoke with patient.  She states after her hernia surgery she was having a lot of nausea so they gave her Reglan and d/c Zofran.  They would not refill Reglan and told her to contact us for a refill. She states it works better than the zofran she was given for nausea associated with MS. (I don't see in the chart where she was prescribed zofran by you but she states she was).  Dr. Delice Lesch please advise.

## 2016-03-13 NOTE — Telephone Encounter (Signed)
Ok to give Reglan 10mg  as needed for nausea, pls let her know we can only do 10 tabs a month, similar to how migraine rescue medications are prescribed. Thanks

## 2016-03-18 ENCOUNTER — Telehealth (HOSPITAL_COMMUNITY): Payer: Self-pay
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
## 2016-03-21 DIAGNOSIS — E7801 Familial hypercholesterolemia: Secondary | ICD-10-CM | POA: Diagnosis not present

## 2016-03-21 DIAGNOSIS — G43909 Migraine, unspecified, not intractable, without status migrainosus: Secondary | ICD-10-CM | POA: Diagnosis not present

## 2016-03-21 DIAGNOSIS — G35 Multiple sclerosis: Secondary | ICD-10-CM | POA: Diagnosis not present

## 2016-03-21 DIAGNOSIS — Z124 Encounter for screening for malignant neoplasm of cervix: Secondary | ICD-10-CM | POA: Diagnosis not present

## 2016-03-21 DIAGNOSIS — Z1211 Encounter for screening for malignant neoplasm of colon: Secondary | ICD-10-CM | POA: Diagnosis not present

## 2016-03-21 DIAGNOSIS — I10 Essential (primary) hypertension: Secondary | ICD-10-CM | POA: Diagnosis not present

## 2016-03-21 DIAGNOSIS — Z1231 Encounter for screening mammogram for malignant neoplasm of breast: Secondary | ICD-10-CM | POA: Diagnosis not present

## 2016-03-21 DIAGNOSIS — Z0000 Encounter for general adult medical examination without abnormal findings: Secondary | ICD-10-CM | POA: Diagnosis not present

## 2016-03-21 DIAGNOSIS — E559 Vitamin D deficiency, unspecified: Secondary | ICD-10-CM | POA: Diagnosis not present

## 2016-03-21 DIAGNOSIS — Z6826 Body mass index (BMI) 26.0-26.9, adult: Secondary | ICD-10-CM | POA: Diagnosis not present

## 2016-03-22 ENCOUNTER — Telehealth: Payer: Self-pay | Admitting: Neurology

## 2016-03-22 NOTE — Telephone Encounter (Signed)
Pt called and would like a letter stating that she is mentally and physically able to take care of her daughter. She has court on the 28 of March and will need to show she can take care of herself and her daughter.  She has an appt on 03/26/16

## 2016-03-22 NOTE — Telephone Encounter (Signed)
Will forward message to provider to be discussed at ov on 03/26/16.

## 2016-03-25 NOTE — Telephone Encounter (Signed)
I spoke with patient today, she is going to court on 3/28 and would like to know if you can write a letter stating that she is mentally fit to take care of her daughter. Patients estranged husband is stating she is not fit and trying to petition for custody. Please advise, thank you.

## 2016-03-26 ENCOUNTER — Encounter: Payer: Self-pay | Admitting: Neurology

## 2016-03-26 ENCOUNTER — Ambulatory Visit (INDEPENDENT_AMBULATORY_CARE_PROVIDER_SITE_OTHER): Payer: Medicare Other | Admitting: Neurology

## 2016-03-26 VITALS — BP 100/60 | HR 98 | Ht 65.0 in | Wt 235.0 lb

## 2016-03-26 DIAGNOSIS — G35 Multiple sclerosis: Secondary | ICD-10-CM

## 2016-03-26 DIAGNOSIS — G43109 Migraine with aura, not intractable, without status migrainosus: Secondary | ICD-10-CM

## 2016-03-26 MED ORDER — DIVALPROEX SODIUM 500 MG PO DR TAB: 500.0000 mg | DELAYED_RELEASE_TABLET | Freq: Every day | ORAL | 3 refills | Status: DC

## 2016-03-26 MED ORDER — TOPIRAMATE 50 MG PO TABS: ORAL_TABLET | ORAL | 3 refills | Status: DC

## 2016-03-26 NOTE — Patient Instructions (Addendum)
1. Increase Topamax 50mg : Take 1 tablet in AM, 2 tablets in PM 2. Continue Depakote 500mg  at bedtime 3. We will follow-up on Gilenya and Botox 4. Be strong!! 5. Follow-up as scheduled in July
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
## 2016-03-27 ENCOUNTER — Encounter: Payer: Self-pay | Admitting: Neurology

## 2016-03-28 ENCOUNTER — Telehealth: Payer: Self-pay

## 2016-03-28 NOTE — Telephone Encounter (Signed)
I am not able to write a letter about her mental ability to care for her daughter as that assessment is more in depth than what is provided here

## 2016-03-28 NOTE — Telephone Encounter (Signed)
Yes we can give a generic letter stating she has been attending appts, self reporting compliance with meds

## 2016-03-28 NOTE — Telephone Encounter (Signed)
Called the patient and let her know, letter is written and has been mailed to the patient

## 2016-03-28 NOTE — Telephone Encounter (Signed)
Clld pt  - advsd letter is upfront and ready for pick up.

## 2016-03-28 NOTE — Telephone Encounter (Signed)
I spoke with patient and she would like to know if she can at least get a letter saying that she is compliant with medications and appointments and is currently stable.

## 2016-04-08 ENCOUNTER — Telehealth: Payer: Self-pay | Admitting: Neurology

## 2016-04-08 NOTE — Telephone Encounter (Signed)
Will forward message to [facility] for update.

## 2016-04-08 NOTE — Telephone Encounter (Signed)
Patient calling to check status on botox approval

## 2016-04-11 ENCOUNTER — Encounter (HOSPITAL_COMMUNITY): Payer: Self-pay | Admitting: Psychiatry

## 2016-04-11 ENCOUNTER — Ambulatory Visit (INDEPENDENT_AMBULATORY_CARE_PROVIDER_SITE_OTHER): Payer: Medicare Other | Admitting: Psychiatry

## 2016-04-11 DIAGNOSIS — F4001 Agoraphobia with panic disorder: Secondary | ICD-10-CM | POA: Diagnosis not present

## 2016-04-11 DIAGNOSIS — Z87891 Personal history of nicotine dependence: Secondary | ICD-10-CM

## 2016-04-11 DIAGNOSIS — F445 Conversion disorder with seizures or convulsions: Secondary | ICD-10-CM

## 2016-04-11 DIAGNOSIS — F332 Major depressive disorder, recurrent severe without psychotic features: Secondary | ICD-10-CM

## 2016-04-11 DIAGNOSIS — F411 Generalized anxiety disorder: Secondary | ICD-10-CM

## 2016-04-11 DIAGNOSIS — Z79899 Other long term (current) drug therapy: Secondary | ICD-10-CM

## 2016-04-11 MED ORDER — DIAZEPAM 5 MG PO TABS: 5.0000 mg | ORAL_TABLET | Freq: Every day | ORAL | 2 refills | Status: DC

## 2016-04-11 MED ORDER — FLUOXETINE HCL 40 MG PO CAPS: 80.0000 mg | ORAL_CAPSULE | Freq: Every day | ORAL | 2 refills | Status: DC

## 2016-04-11 MED ORDER — BUSPIRONE HCL 15 MG PO TABS: 15.0000 mg | ORAL_TABLET | Freq: Three times a day (TID) | ORAL | 2 refills | Status: DC

## 2016-04-11 MED ORDER — DIPHENHYDRAMINE HCL (SLEEP) 25 MG PO TABS: 50.0000 mg | ORAL_TABLET | Freq: Every evening | ORAL | 2 refills | Status: DC | PRN
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
## 2016-04-22 DIAGNOSIS — J020 Streptococcal pharyngitis: Secondary | ICD-10-CM | POA: Diagnosis not present

## 2016-04-23 ENCOUNTER — Telehealth: Payer: Self-pay

## 2016-04-23 NOTE — Telephone Encounter (Signed)
Clld Medicaid [phone] for prior approval for Botox injections. Advsd by [place]/CSR  to review web site  [email or web address] for directions for approval.   Current list does not approve Botox injections for headaches. Contacted Botox rep - [place] re ICD codes.

## 2016-04-25 ENCOUNTER — Telehealth: Payer: Self-pay | Admitting: Neurology

## 2016-04-25 NOTE — Telephone Encounter (Signed)
Please call.

## 2016-04-25 NOTE — Telephone Encounter (Signed)
I called patient to let her know we are working on the Botox.  She also wants to know status of [facility].  She said it was approved but they were looking for a place for her to receive this med.  I have contacted [facility] and the patient has to sign and date the Start Form.   Colletta Maryland will fax the form over to the office and the patient would like to come to the office to sign.  We will then fax the form back to [phone] and [facility] will contact the patient to set up a first dose location.

## 2016-04-25 NOTE — Telephone Encounter (Signed)
Calling to checking to see if we have heard any thing from botox  Approval  And [facility]  Please call her back

## 2016-05-02 ENCOUNTER — Ambulatory Visit (HOSPITAL_COMMUNITY): Payer: Self-pay | Admitting: Psychiatry

## 2016-05-02 ENCOUNTER — Telehealth: Payer: Self-pay | Admitting: Neurology

## 2016-05-02 NOTE — Telephone Encounter (Signed)
Caller: Noni  Urgent? No  Reason for the call: She was calling to update Dr. Delice Lesch on her Botox  information that she received in the mail. She said medicaid does not cover Botox. That it has to go through Botox patient assistance program. She has not had a Botox appointment yet. She just wanted to let her know. Thanks

## 2016-05-07 ENCOUNTER — Telehealth: Payer: Self-pay | Admitting: Neurology

## 2016-05-07 NOTE — Telephone Encounter (Signed)
I spoke with Vickie at [facility] earlier today.  No one seems to have a viable phone number for this pt.    If pt calls in regards to this, I will need a phone number that I can actually reach her at.  Between [place] and [facility] we have 3 different numbers, none of which are correct.

## 2016-05-07 NOTE — Telephone Encounter (Signed)
Caller: Colletta Maryland With [facility]  Urgent? No  Reason for the call: She has been unable to reach [facility] with the numbers they have on file. Thanks

## 2016-05-13 DIAGNOSIS — S76312A Strain of muscle, fascia and tendon of the posterior muscle group at thigh level, left thigh, initial encounter: Secondary | ICD-10-CM | POA: Diagnosis not present

## 2016-05-13 DIAGNOSIS — G35 Multiple sclerosis: Secondary | ICD-10-CM | POA: Diagnosis not present

## 2016-05-13 DIAGNOSIS — I10 Essential (primary) hypertension: Secondary | ICD-10-CM | POA: Diagnosis not present

## 2016-05-14 ENCOUNTER — Telehealth: Payer: Self-pay | Admitting: Neurology

## 2016-05-14 NOTE — Telephone Encounter (Signed)
PT called and wanted to know if the Botox has been approved and also she has an appointment ton the 21st at urgent care for the genlenial(spelling)

## 2016-05-22 ENCOUNTER — Telehealth: Payer: Self-pay | Admitting: Neurology

## 2016-05-22 NOTE — Telephone Encounter (Signed)
Patient has some information about the botox please call her back

## 2016-05-26 DIAGNOSIS — R1110 Vomiting, unspecified: Secondary | ICD-10-CM | POA: Diagnosis not present

## 2016-05-26 DIAGNOSIS — F418 Other specified anxiety disorders: Secondary | ICD-10-CM | POA: Diagnosis not present

## 2016-05-26 DIAGNOSIS — Z79899 Other long term (current) drug therapy: Secondary | ICD-10-CM | POA: Diagnosis not present

## 2016-05-26 DIAGNOSIS — N390 Urinary tract infection, site not specified: Secondary | ICD-10-CM | POA: Diagnosis not present

## 2016-05-26 DIAGNOSIS — G35 Multiple sclerosis: Secondary | ICD-10-CM | POA: Diagnosis not present

## 2016-05-26 DIAGNOSIS — Z87891 Personal history of nicotine dependence: Secondary | ICD-10-CM | POA: Diagnosis not present

## 2016-05-26 DIAGNOSIS — R1030 Lower abdominal pain, unspecified: Secondary | ICD-10-CM | POA: Diagnosis not present

## 2016-05-26 DIAGNOSIS — N200 Calculus of kidney: Secondary | ICD-10-CM | POA: Diagnosis not present

## 2016-05-28 DIAGNOSIS — G35 Multiple sclerosis: Secondary | ICD-10-CM | POA: Diagnosis not present

## 2016-05-29 ENCOUNTER — Other Ambulatory Visit: Payer: Self-pay

## 2016-05-29 DIAGNOSIS — G35 Multiple sclerosis: Secondary | ICD-10-CM

## 2016-06-07 ENCOUNTER — Telehealth: Payer: Self-pay | Admitting: Neurology

## 2016-06-07 NOTE — Telephone Encounter (Signed)
Caller: Patient  Urgent? No  Reason for the call: She was checking on the status of her Botox being approved and scheduled. Thanks

## 2016-06-20 ENCOUNTER — Ambulatory Visit (INDEPENDENT_AMBULATORY_CARE_PROVIDER_SITE_OTHER): Payer: Medicare Other | Admitting: Neurology

## 2016-06-20 ENCOUNTER — Ambulatory Visit (HOSPITAL_COMMUNITY): Payer: Self-pay | Admitting: Psychiatry

## 2016-06-20 DIAGNOSIS — G43709 Chronic migraine without aura, not intractable, without status migrainosus: Secondary | ICD-10-CM | POA: Diagnosis not present

## 2016-06-20 MED ORDER — ONABOTULINUMTOXINA 100 UNITS IJ SOLR
155.0000 [IU] | Freq: Once | INTRAMUSCULAR | Status: AC
  Administered 2016-06-20: 155 [IU] via INTRAMUSCULAR

## 2016-06-20 NOTE — Progress Notes (Signed)
[facility] Clinic   Procedure Note Botox  Attending: Dr. Arnice Vanepps  Preoperative Diagnosis(es): Chronic migraine  Consent obtained from: The patient Benefits discussed included, but were not limited to decreased muscle tightness, increased joint range of motion, and decreased pain.  Risk discussed included, but were not limited pain and discomfort, bleeding, bruising, excessive weakness, venous thrombosis, muscle atrophy and dysphagia.  Anticipated outcomes of the procedure as well as he risks and benefits of the alternatives to the procedure, and the roles and tasks of the personnel to be involved, were discussed with the patient, and the patient consents to the procedure and agrees to proceed. A copy of the patient medication guide was given to the patient which explains the blackbox warning.  Patients identity and treatment sites confirmed Yes.  .  Details of Procedure: Skin was cleaned with alcohol. Prior to injection, the needle plunger was aspirated to make sure the needle was not within a blood vessel.  There was no blood retrieved on aspiration.    Following is a summary of the muscles injected  And the amount of Botulinum toxin used:  Dilution 200 units of Botox was reconstituted with 4 ml of preservative free normal saline. Time of reconstitution: At the time of the office visit (<30 minutes prior to injection)   Injections  155 total units of Botox was injected with a 30 gauge needle.  Injection Sites: L occipitalis: 15 units- 3 sites  R occiptalis: 15 units- 3 sites  L upper trapezius: 15 units- 3 sites R upper trapezius: 15 units- 3 sits          L paraspinal: 10 units- 2 sites R paraspinal: 10 units- 2 sites  Face L frontalis(2 injection sites):10 units   R frontalis(2 injection sites):10 units         L corrugator: 5 units   R corrugator: 5 units           Procerus: 5 units   L temporalis: 20 units R temporalis: 20 units   Agent:  200 units of botulinum Type A  (Onobotulinum Toxin type A) was reconstituted with 4 ml of preservative free normal saline.  Time of reconstitution: At the time of the office visit (<30 minutes prior to injection)     Total injected (Units): 155  Total wasted (Units): none wasted  Patient tolerated procedure well without complications.   Reinjection is anticipated in 3 months.  Advait Buice R. Alyda Megna, DO   

## 2016-06-24 ENCOUNTER — Telehealth: Payer: Self-pay | Admitting: Neurology

## 2016-06-24 NOTE — Telephone Encounter (Signed)
Caller: Breya    Urgent? No  Reason for the call: Checking on the referral status of pain management. Please call. Thanks

## 2016-06-28 NOTE — Telephone Encounter (Signed)
Called pain clinic at [facility] to see what is going on with this referral.  No answer as they close at noon on Friday's.  LMOM asking for a return call to our office

## 2016-07-21 ENCOUNTER — Other Ambulatory Visit (HOSPITAL_COMMUNITY): Payer: Self-pay | Admitting: Psychiatry

## 2016-07-21 DIAGNOSIS — F4001 Agoraphobia with panic disorder: Secondary | ICD-10-CM

## 2016-07-21 DIAGNOSIS — F411 Generalized anxiety disorder: Secondary | ICD-10-CM

## 2016-07-22 ENCOUNTER — Other Ambulatory Visit (HOSPITAL_COMMUNITY): Payer: Self-pay

## 2016-07-22 DIAGNOSIS — F411 Generalized anxiety disorder: Secondary | ICD-10-CM

## 2016-07-22 DIAGNOSIS — F4001 Agoraphobia with panic disorder: Secondary | ICD-10-CM

## 2016-07-22 NOTE — Telephone Encounter (Signed)
Medication refill request - Telephone call with patient who left a message she would need a new Diazepam order before she returns on 08/08/16 to see Dr. Doyne Keel next. Patient's last order 04/11/16 + 2 refills.  Patient reported she last filled the medication on 06/23/16 but has enough until Dr. Doyne Keel returns on Thursday this week, 07/25/16 but requests a new order be called in that day until she returns for follow up appointment on 08/08/16. Agreed to send message to Dr. Doyne Keel to review upon her return and patient to follow up on 07/25/16.

## 2016-07-23 ENCOUNTER — Encounter: Payer: Self-pay | Admitting: Nurse Practitioner

## 2016-07-23 ENCOUNTER — Ambulatory Visit: Payer: Medicare Other | Attending: Nurse Practitioner | Admitting: Nurse Practitioner

## 2016-07-23 VITALS — BP 135/85 | HR 72 | Temp 98.2°F | Resp 18 | Ht 64.0 in | Wt 230.0 lb

## 2016-07-23 DIAGNOSIS — H469 Unspecified optic neuritis: Secondary | ICD-10-CM | POA: Diagnosis not present

## 2016-07-23 DIAGNOSIS — K432 Incisional hernia without obstruction or gangrene: Secondary | ICD-10-CM | POA: Insufficient documentation

## 2016-07-23 DIAGNOSIS — F329 Major depressive disorder, single episode, unspecified: Secondary | ICD-10-CM | POA: Insufficient documentation

## 2016-07-23 DIAGNOSIS — M542 Cervicalgia: Secondary | ICD-10-CM

## 2016-07-23 DIAGNOSIS — M545 Low back pain: Secondary | ICD-10-CM | POA: Insufficient documentation

## 2016-07-23 DIAGNOSIS — Z79899 Other long term (current) drug therapy: Secondary | ICD-10-CM | POA: Insufficient documentation

## 2016-07-23 DIAGNOSIS — E669 Obesity, unspecified: Secondary | ICD-10-CM | POA: Diagnosis not present

## 2016-07-23 DIAGNOSIS — M79602 Pain in left arm: Secondary | ICD-10-CM

## 2016-07-23 DIAGNOSIS — G5603 Carpal tunnel syndrome, bilateral upper limbs: Secondary | ICD-10-CM | POA: Diagnosis not present

## 2016-07-23 DIAGNOSIS — G40909 Epilepsy, unspecified, not intractable, without status epilepticus: Secondary | ICD-10-CM | POA: Insufficient documentation

## 2016-07-23 DIAGNOSIS — M79605 Pain in left leg: Secondary | ICD-10-CM

## 2016-07-23 DIAGNOSIS — M79604 Pain in right leg: Secondary | ICD-10-CM | POA: Diagnosis not present

## 2016-07-23 DIAGNOSIS — M5441 Lumbago with sciatica, right side: Secondary | ICD-10-CM

## 2016-07-23 DIAGNOSIS — G894 Chronic pain syndrome: Secondary | ICD-10-CM | POA: Diagnosis not present

## 2016-07-23 DIAGNOSIS — I10 Essential (primary) hypertension: Secondary | ICD-10-CM | POA: Diagnosis not present

## 2016-07-23 DIAGNOSIS — F1190 Opioid use, unspecified, uncomplicated: Secondary | ICD-10-CM | POA: Insufficient documentation

## 2016-07-23 DIAGNOSIS — Z79891 Long term (current) use of opiate analgesic: Secondary | ICD-10-CM | POA: Insufficient documentation

## 2016-07-23 DIAGNOSIS — M79601 Pain in right arm: Secondary | ICD-10-CM | POA: Diagnosis not present

## 2016-07-23 DIAGNOSIS — M25562 Pain in left knee: Secondary | ICD-10-CM

## 2016-07-23 DIAGNOSIS — M5442 Lumbago with sciatica, left side: Secondary | ICD-10-CM

## 2016-07-23 DIAGNOSIS — M546 Pain in thoracic spine: Secondary | ICD-10-CM | POA: Diagnosis not present

## 2016-07-23 DIAGNOSIS — G8929 Other chronic pain: Secondary | ICD-10-CM | POA: Diagnosis not present

## 2016-07-23 DIAGNOSIS — F411 Generalized anxiety disorder: Secondary | ICD-10-CM | POA: Diagnosis not present

## 2016-07-23 NOTE — Patient Instructions (Addendum)
You have been ordered lab work and a med psych eval today.  When you have completed these, call office for follow up appointment with Dr Dossie Arbour.   ____________________________________________________________________________________________  Appointment Policy Summary  It is our goal and responsibility to provide the medical community with assistance in the evaluation and management of patients with chronic pain. Unfortunately our resources are limited. Because we do not have an unlimited amount of time, or available appointments, we are required to closely monitor and manage their use. The following rules exist to maximize their use:  Patient's responsibilities: 1. Punctuality:  At what time should I arrive? You should be physically present in our office 30 minutes before your scheduled appointment. Your scheduled appointment is with your assigned healthcare provider. However, it takes 5-10 minutes to be "checked-in", and another 15 minutes for the nurses to do the admission. If you arrive to our office at the time you were given for your appointment, you will end up being at least 20-25 minutes late to your appointment with the provider. 2. Tardiness:  What happens if I arrive only a few minutes after my scheduled appointment time? You will need to reschedule your appointment. The cutoff is your appointment time. This is why it is so important that you arrive at least 30 minutes before that appointment. If you have an appointment scheduled for 10:00 AM and you arrive at 10:01, you will be required to reschedule your appointment.  3. Plan ahead:  Always assume that you will encounter traffic on your way in. Plan for it. If you are dependent on a driver, make sure they understand these rules and the need to arrive early. 4. Other appointments and responsibilities:  Avoid scheduling any other appointments before or after your pain clinic appointments.  5. Be prepared:  Write down everything that you  need to discuss with your healthcare provider and give this information to the admitting nurse. Write down the medications that you will need refilled. Bring your pills and bottles (even the empty ones), to all of your appointments, except for those where a procedure is scheduled. 6. No children or pets:  Find someone to take care of them. It is not appropriate to bring them in. 7. Scheduling changes:  We request "advanced notification" of any changes or cancellations. 8. Advanced notification:  Defined as a time period of more than 24 hours prior to the originally scheduled appointment. This allows for the appointment to be offered to other patients. 9. Rescheduling:  When a visit is rescheduled, it will require the cancellation of the original appointment. For this reason they both fall within the category of "Cancellations".  10. Cancellations:  They require advanced notification. Any cancellation less than 24 hours before the  appointment will be recorded as a "No Show". 11. No Show:  Defined as an unkept appointment where the patient failed to notify or declare to the practice their intention or inability to keep the appointment.  Corrective process for repeat offenders:  1. Tardiness: Three (3) episodes of rescheduling due to late arrivals will be recorded as one (1) "No Show". 2. Cancellation or reschedule: Three (3) cancellations or rescheduling will be recorded as one (1) "No Show". 3. "No Shows": Three (3) "No Shows" within a 12 month period will result in discharge from the practice.  ____________________________________________________________________________________________  ____________________________________________________________________________________________  Pain Scale  Introduction: The pain score used by this practice is the Verbal Numerical Rating Scale (VNRS-11). This is an 11-point scale. It is for  adults and children 10 years or older. There are significant  differences in how the pain score is reported, used, and applied. Forget everything you learned in the past and learn this scoring system.  General Information: The scale should reflect your current level of pain. Unless you are specifically asked for the level of your worst pain, or your average pain. If you are asked for one of these two, then it should be understood that it is over the past 24 hours.  Basic Activities of Daily Living (ADL): Personal hygiene, dressing, eating, transferring, and using restroom.  Instructions: Most patients tend to report their level of pain as a combination of two factors, their physical pain and their psychosocial pain. This last one is also known as "suffering" and it is reflection of how physical pain affects you socially and psychologically. From now on, report them separately. From this point on, when asked to report your pain level, report only your physical pain. Use the following table for reference.  Pain Clinic Pain Levels (0-5/10)  Pain Level Score  Description  No Pain 0   Mild pain 1 Nagging, annoying, but does not interfere with basic activities of daily living (ADL). Patients are able to eat, bathe, get dressed, toileting (being able to get on and off the toilet and perform personal hygiene functions), transfer (move in and out of bed or a chair without assistance), and maintain continence (able to control bladder and bowel functions). Blood pressure and heart rate are unaffected. A normal heart rate for a healthy adult ranges from 60 to 100 bpm (beats per minute).   Mild to moderate pain 2 Noticeable and distracting. Impossible to hide from other people. More frequent flare-ups. Still possible to adapt and function close to normal. It can be very annoying and may have occasional stronger flare-ups. With discipline, patients may get used to it and adapt.   Moderate pain 3 Interferes significantly with activities of daily living (ADL). It becomes  difficult to feed, bathe, get dressed, get on and off the toilet or to perform personal hygiene functions. Difficult to get in and out of bed or a chair without assistance. Very distracting. With effort, it can be ignored when deeply involved in activities.   Moderately severe pain 4 Impossible to ignore for more than a few minutes. With effort, patients may still be able to manage work or participate in some social activities. Very difficult to concentrate. Signs of autonomic nervous system discharge are evident: dilated pupils (mydriasis); mild sweating (diaphoresis); sleep interference. Heart rate becomes elevated (>115 bpm). Diastolic blood pressure (lower number) rises above 100 mmHg. Patients find relief in laying down and not moving.   Severe pain 5 Intense and extremely unpleasant. Associated with frowning face and frequent crying. Pain overwhelms the senses.  Ability to do any activity or maintain social relationships becomes significantly limited. Conversation becomes difficult. Pacing back and forth is common, as getting into a comfortable position is nearly impossible. Pain wakes you up from deep sleep. Physical signs will be obvious: pupillary dilation; increased sweating; goosebumps; brisk reflexes; cold, clammy hands and feet; nausea, vomiting or dry heaves; loss of appetite; significant sleep disturbance with inability to fall asleep or to remain asleep. When persistent, significant weight loss is observed due to the complete loss of appetite and sleep deprivation.  Blood pressure and heart rate becomes significantly elevated. Caution: If elevated blood pressure triggers a pounding headache associated with blurred vision, then the patient should immediately seek attention  at an urgent or emergency care unit, as these may be signs of an impending stroke.    Emergency Department Pain Levels (6-10/10)  Emergency Room Pain 6 Severely limiting. Requires emergency care and should not be seen or  managed at an outpatient pain management facility. Communication becomes difficult and requires great effort. Assistance to reach the emergency department may be required. Facial flushing and profuse sweating along with potentially dangerous increases in heart rate and blood pressure will be evident.   Distressing pain 7 Self-care is very difficult. Assistance is required to transport, or use restroom. Assistance to reach the emergency department will be required. Tasks requiring coordination, such as bathing and getting dressed become very difficult.   Disabling pain 8 Self-care is no longer possible. At this level, pain is disabling. The individual is unable to do even the most "basic" activities such as walking, eating, bathing, dressing, transferring to a bed, or toileting. Fine motor skills are lost. It is difficult to think clearly.   Incapacitating pain 9 Pain becomes incapacitating. Thought processing is no longer possible. Difficult to remember your own name. Control of movement and coordination are lost.   The worst pain imaginable 10 At this level, most patients pass out from pain. When this level is reached, collapse of the autonomic nervous system occurs, leading to a sudden drop in blood pressure and heart rate. This in turn results in a temporary and dramatic drop in blood flow to the brain, leading to a loss of consciousness. Fainting is one of the body's self defense mechanisms. Passing out puts the brain in a calmed state and causes it to shut down for a while, in order to begin the healing process.    Summary: 1. Refer to this scale when providing [place] with your pain level. 2. Be accurate and careful when reporting your pain level. This will help with your care. 3. Over-reporting your pain level will lead to loss of credibility. 4. Even a level of 1/10 means that there is pain and will be treated at our facility. 5. High, inaccurate reporting will be documented as "Symptom  Exaggeration", leading to loss of credibility and suspicions of possible secondary gains such as obtaining more narcotics, or wanting to appear disabled, for fraudulent reasons. 6. Only pain levels of 5 or below will be seen at our facility. 7. Pain levels of 6 and above will be sent to the Emergency Department and the appointment cancelled. ____________________________________________________________________________________________

## 2016-07-23 NOTE — Progress Notes (Signed)
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  ____________________________________________________________________________________________  Pain Scale  Introduction: The pain score used by this practice is the Verbal Numerical Rating Scale (VNRS-11). This is an 11-point scale. It is for adults and children 10 years or older. There are significant differences in how the pain score is reported, used, and applied. Forget everything you learned in the past and learn this scoring system.  General Information: The scale should reflect your current level of pain. Unless you are specifically asked for the level of your worst pain, or your average pain. If you are asked for one of these two, then it should be understood that it is over the past 24 hours.  Basic Activities of Daily Living (ADL): Personal hygiene, dressing, eating, transferring, and using restroom.  Instructions: Most patients tend to report their level of pain as a combination of two factors, their physical pain and their psychosocial pain. This last one is also known as "suffering" and it is reflection of how physical pain affects you socially and psychologically. From now on, report them separately. From this point on, when asked to report your pain level, report only your physical pain. Use the following table for reference.  Pain Clinic Pain Levels (0-5/10)  Pain Level Score  Description  No Pain 0   Mild pain 1 Nagging, annoying, but does not interfere with basic activities of daily living (ADL). Patients are able to eat, bathe, get dressed,  toileting (being able to get on and off the toilet and perform personal hygiene functions), transfer (move in and out of bed or a chair without assistance), and maintain continence (able to control bladder and bowel functions). Blood pressure and heart rate are unaffected. A normal heart rate for a healthy adult ranges from 60 to 100 bpm (beats per minute).   Mild to moderate pain 2 Noticeable and distracting. Impossible to hide from other people. More frequent flare-ups. Still possible to adapt and function close to normal. It can be very annoying and may have occasional stronger flare-ups. With discipline, patients may get used to it and adapt.   Moderate pain 3 Interferes significantly with activities of daily living (ADL). It becomes difficult to feed, bathe, get dressed, get on and off the toilet or to perform personal hygiene functions. Difficult to get in and out of bed or a chair without assistance. Very distracting. With effort, it can be ignored when deeply involved in activities.   Moderately severe pain 4 Impossible to ignore for more than a few minutes. With effort, patients may still be able to manage work or participate in some social activities. Very difficult to concentrate. Signs of autonomic nervous system discharge are evident: dilated pupils (mydriasis); mild sweating (diaphoresis); sleep interference. Heart rate becomes elevated (>115 bpm). Diastolic blood pressure (lower number) rises above 100 mmHg. Patients find relief in laying down and not moving.   Severe pain 5 Intense and extremely unpleasant. Associated with frowning face and frequent crying. Pain overwhelms the senses.  Ability to do any activity or maintain social relationships becomes significantly limited. Conversation becomes difficult. Pacing back and forth is common, as getting into a comfortable position is nearly impossible. Pain wakes you up from deep sleep. Physical signs will be obvious: pupillary dilation;  increased sweating; goosebumps; brisk reflexes; cold, clammy hands and feet; nausea, vomiting or dry heaves; loss of appetite; significant sleep disturbance with inability to fall asleep or to remain asleep. When persistent, significant weight loss is observed due to the complete loss of appetite and sleep deprivation.  Blood pressure and heart rate becomes significantly elevated. Caution: If elevated blood pressure triggers a pounding headache associated with blurred vision, then the patient should immediately seek attention at an urgent or emergency care unit, as these may be signs of an impending stroke.    Emergency Department Pain Levels (6-10/10)  Emergency Room Pain 6 Severely limiting. Requires emergency care and should not be seen or managed at an outpatient pain management facility. Communication becomes difficult and requires great effort. Assistance to reach the emergency department may be required. Facial flushing and profuse sweating along with potentially dangerous increases in heart rate and blood pressure will be evident.   Distressing pain 7 Self-care is very difficult. Assistance is required to transport, or use restroom. Assistance to reach the emergency department will be required. Tasks requiring coordination, such as bathing and getting dressed become very difficult.   Disabling pain 8 Self-care is no longer possible. At this level, pain is disabling. The individual is unable to do even the most "basic" activities such as walking, eating, bathing, dressing, transferring to a bed, or toileting. Fine motor skills are lost. It is difficult to think clearly.   Incapacitating pain 9 Pain becomes incapacitating. Thought processing is no longer possible. Difficult to remember your own name. Control of movement and coordination are lost.   The worst pain imaginable 10 At this level, most patients pass out from pain. When this level is reached, collapse of the autonomic nervous system  occurs, leading to a sudden drop in blood pressure and heart rate. This in turn results in a temporary and dramatic drop in blood flow to the brain, leading to a loss of consciousness. Fainting is one of the body's self defense mechanisms. Passing out puts the brain in a calmed state and causes it to shut down for a while, in order to begin the healing process.    Summary: 1. Refer to this scale when providing [place] with your pain level. 2. Be accurate and careful when reporting your pain level. This will help with your care. 3. Over-reporting your pain level will lead to loss of credibility. 4. Even a level of 1/10 means that there is pain and will be treated at our facility. 5. High, inaccurate reporting will be documented as "Symptom Exaggeration", leading to loss of credibility and suspicions of possible secondary gains such as obtaining more narcotics, or wanting to appear disabled, for fraudulent reasons. 6. Only pain levels of 5 or below will be seen at our facility. 7. Pain levels of 6 and above will be sent to the Emergency Department and the appointment cancelled. ____________________________________________________________________________________________

## 2016-07-23 NOTE — Progress Notes (Signed)
Safety precautions to be maintained throughout the outpatient stay will include: orient to surroundings, keep bed in low position, maintain call bell within reach at all times, provide assistance with transfer out of bed and ambulation.  

## 2016-07-24 ENCOUNTER — Encounter: Payer: Self-pay | Admitting: Neurology

## 2016-07-24 ENCOUNTER — Ambulatory Visit (INDEPENDENT_AMBULATORY_CARE_PROVIDER_SITE_OTHER): Payer: Medicare Other | Admitting: Neurology

## 2016-07-24 VITALS — BP 128/74 | HR 73 | Ht 64.0 in | Wt 234.0 lb

## 2016-07-24 DIAGNOSIS — G43709 Chronic migraine without aura, not intractable, without status migrainosus: Secondary | ICD-10-CM

## 2016-07-24 DIAGNOSIS — G894 Chronic pain syndrome: Secondary | ICD-10-CM | POA: Diagnosis not present

## 2016-07-24 DIAGNOSIS — F445 Conversion disorder with seizures or convulsions: Secondary | ICD-10-CM | POA: Diagnosis not present

## 2016-07-24 DIAGNOSIS — G35 Multiple sclerosis: Secondary | ICD-10-CM

## 2016-07-24 DIAGNOSIS — G43109 Migraine with aura, not intractable, without status migrainosus: Secondary | ICD-10-CM

## 2016-07-24 DIAGNOSIS — G5603 Carpal tunnel syndrome, bilateral upper limbs: Secondary | ICD-10-CM | POA: Diagnosis not present

## 2016-07-24 DIAGNOSIS — F411 Generalized anxiety disorder: Secondary | ICD-10-CM

## 2016-07-24 MED ORDER — TOPIRAMATE 50 MG PO TABS: ORAL_TABLET | ORAL | 3 refills | Status: DC

## 2016-07-24 MED ORDER — SUMATRIPTAN SUCCINATE 100 MG PO TABS: 100.0000 mg | ORAL_TABLET | ORAL | 6 refills | Status: DC | PRN

## 2016-07-24 MED ORDER — DIVALPROEX SODIUM 500 MG PO DR TAB: 500.0000 mg | DELAYED_RELEASE_TABLET | Freq: Every day | ORAL | 3 refills | Status: DC

## 2016-07-24 NOTE — Patient Instructions (Addendum)
Looking good! Continue all your medications. Wishing you all the best. Continue with Botox, follow-up in 3-4 months, call for any changes.

## 2016-07-24 NOTE — Progress Notes (Signed)
NEUROLOGY FOLLOW UP OFFICE NOTE  Emily [name] [id number]  HISTORY OF PRESENT ILLNESS: I had the pleasure of seeing Emily [name] in follow-up in the neurology clinic on 07/24/2016.  The patient was last seen 4 months ago for worsening of migraines. She has several neurological conditions. She has a history of MS and stopped Gilenya during pregnancy. It took a while to finally get her back on the medication, but she has finally been restarted and tolerating it well without side effects or evidence of MS flares. She was reporting worsening migraines on her last visit, dose of Topamax increased to 50,100mg . She is also taking Depakote 500mg  qhs. She has finally been able to restart Botox as well, first session was last month, she reports that it has helped a lot. She is overall doing pretty good, she feels things are getting a lot better, she is handling things better. She went to Pain Management yesterday. She continues to have panic attacks but is down to taking Xanax only at night for night terrors. She has not had any PNES shaking events recently. She is undergoing a lot of stress dealing with her ex-partner and restraining order/child support issues. She fell 3 weeks ago after her left knee gave out, she is wearing a left knee brace.   HPI 03/28/15: This is a pleasant 36 yo RH woman with a history of relapsing-remitting multiple sclerosis, headaches, anxiety with panic attacks, conversion disorder with seizures. She presented at [redacted] weeks pregnant to determine if diazepam for anxiety/seizures would be a medication she can use, understanding this is pregnancy category D versus risks of injuring herself from falls when she has the psychogenic seizures. Records from her previous neurologists at [facility] were reviewed. She was diagnosed with MS in June 2008 while living in [place] state, she started having right thigh pain and right eye blurred vision. She was diagnosed with right optic  neuritis and underwent MRI brain and lumbar puncture which confirmed a diagnosis of MS. 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 She has been on Gilenya since 2014 without any relapse. This was discontinued in December 2016 when she found out she was pregnant. She has a history of chronic pain and migraines, and was taking Depakote and Topamax for headache prophylaxis, also stopped in December 2016. She reports having Botox for her migraines, but so far migraines are not as bad as they were.  She started having shaking episodes in April 2016. Initially Depakote dose was increased to 500mg  BID. She was back in May 2016 with multiple episodes of syncope, palpitations, a sensation of her heart jumping out of her chest. She also reported lightheadedness, facial numbness, and numbness in her hands and feet. On her last visit in June 2016 at [facility], shaking episodes were discussed. Events always occur after a stressful event. She had a spell in [place] where she had a normal EEG and was diagnosed with a conversion disorder. She had seen Dr. Darleene Cleaver and reportedly told her he could not help her because "her case was too complex." She started seeing a psychiatrist in [place], however since she switched neurologists, she reports that he cannot see her anymore. She has not been able to establish care with [facility] and continues to have anxiety and panic attacks "all the time." She is diaphoretic in the office today and reports she always  feels anxious. When she starts having one of her seizures, she would have significant anxiety, "my body can't deal and goes into a seizure," she hears her heartbeat in her ears. If it slowly builds up, she can get herself to a safe place, however other times it "just hits me," and she falls to the ground and has injured herself. She  reports falling out one time in the bathroom during a doctor visit. She is currently on Fluoxetine and Buspirone. Clonazepam in the past did not help. She would usually go to the ER for these spells, which would quiet down once she gets Benadryl and Diazepam.  She delivered early at [redacted] weeks gestation last July 12, 2015. A week after delivery, she started having numbness on the top of both feet, pain in the balls of her feet, with numbness traveling up to her mid-back. She also has numbness in both forearms, and reports her palms feel the same as her soles, with constant stabbing burning pain. She had these symptoms pre-pregnancy but states the numbness only went up to her knees. During pregnancy, all her symptoms were less bothersome. She has been following with Pain management in [facility] and would occasionally take Oxycodone prior to her pregnancy.  Diagnostic Data:  I personally reviewed MRI brain with and without contrast done with [facility] last 10/02/13 which did not show any acute changes, there was multiple T2/FLAIR lesions in the subcortical and deep white matter regions, no abnormal enhancement. Hippocampi appear asymmetric, smaller on the right without abnormal signal or enhancement.  MRI C-spine with and without contrast 10/18/13 showed hazy T2 hyperintensity within the cord from C2-3 to C4-5 without abnormal enhancement.  per [facility] - MRI brain done 03/18/14: "No change in numerous multifocal T2 hyperintense white matter lesions relative to prior MRI from 04/17/2013 most consistent with demyelinating lesions given history of multiple sclerosis. No evidence of active demyelination."  MRI cervical spine done 03/18/14: "Relative to prior MRI from 10/20/2009, no change in patchy foci of increased T2 signal in the dorsal cord at C2- C4 in keeping with demyelinating lesions in this patient with a clinical history of multiple sclerosis.  No evidence of enhancement to suggest active demyelination.  Moderately advanced degenerative disease at C5-C6 and C6-C7 with interval development of left eccentric central, foraminal entry zone and foraminal disc extrusion at C6-C7 exerting mass effect on the exiting C7 and descending C8 roots.  MRI brain and C-spine 07/30/2015 with and without contrast : There was no abnormal enhancement. There were no changes from previous MRI brain in 09/2013 with multiple bilateral subcortical greater than 10 periventricular T2 hyperintensities. There were no cord lesions seen up to T3-4. There was chronic degenerative disc disease and central canal stenosis at C5-6 and C6-7, no abnormal cord signal, unchanged from 06/2014.  EMG/NCV done on both UE and right LE showed bilateral carpal tunnel syndrome, mild to moderate in degree, no evidence of polyneuropathy or radiculopathy on the right side.  PAST MEDICAL HISTORY: Past Medical History:  Diagnosis Date  . Anxiety   . Asthma as a child  . Chronic back pain   . Chronic pain   . Conversion disorder   . Depression   . GERD (gastroesophageal reflux disease)   . Headache(784.0)    migraines  . HTN (hypertension) 02/27/2015  . JC virus antibody positive   . Migraines   . MS (multiple sclerosis) ([facility])    06-2006  . Multiple sclerosis ([facility])   . Ovarian cyst   .  Panic attack   . Perforated bowel ([place]) 2009  . Pseudoseizures   . PTSD (post-traumatic stress disorder)   . S/P emergency C-section   . Urinary urgency     MEDICATIONS: Current Outpatient Prescriptions on File Prior to Visit  Medication Sig Dispense Refill  . albuterol (PROVENTIL HFA;VENTOLIN HFA) 108 (90 Base) MCG/ACT inhaler Inhale 2 puffs into the lungs every 6 (six) hours as needed for wheezing or shortness of breath. 1 Inhaler 0  . amLODipine (NORVASC) 5 MG tablet Take 5 mg by mouth every evening.  2  . busPIRone (BUSPAR) 15 MG tablet Take 1 tablet (15 mg total) by mouth 3 (three) times daily. 90 tablet 2  . diazepam (VALIUM) 5 MG tablet Take 1  tablet (5 mg total) by mouth daily. Take 1 tablet as needed for panic attacks leading to seizures. 30 tablet 2  . diphenhydrAMINE (SOMINEX) 25 MG tablet Take 2 tablets (50 mg total) by mouth at bedtime as needed for sleep. 60 tablet 2  . divalproex (DEPAKOTE) 500 MG DR tablet Take 1 tablet (500 mg total) by mouth at bedtime. 90 tablet 3  . EPINEPHrine (EPIPEN) 0.3 mg/0.3 mL SOAJ injection Inject 0.3 mg into the muscle as needed (allergic reaction). Reported on 03/28/2015    . etonogestrel (NEXPLANON) 68 MG IMPL implant 1 each by Subdermal route once.    . Fingolimod HCl (GILENYA) 0.5 MG CAPS Take by mouth daily.    Marland Kitchen FLUoxetine (PROZAC) 40 MG capsule Take 2 capsules (80 mg total) by mouth daily. 60 capsule 2  . ibuprofen (ADVIL,MOTRIN) 200 MG tablet Take 600 mg by mouth every 6 (six) hours as needed for headache or moderate pain.    . methocarbamol (ROBAXIN) 500 MG tablet Take 500 mg by mouth 2 (two) times daily as needed for muscle spasms.   2  . metoCLOPramide (REGLAN) 10 MG tablet TAKE 1 TABLET AS NEEDED 2-3 TIMES A WEEK. 10 tablet 0  . Multiple Vitamins-Minerals (MULTIVITAMIN ADULT PO) Take 1 tablet by mouth daily.     . OnabotulinumtoxinA (BOTOX IJ) Inject as directed every 3 (three) months.    . ranitidine (ZANTAC) 300 MG capsule Take 300 mg by mouth at bedtime. Reported on 07/21/2015    . simethicone (MYLICON) 80 MG chewable tablet Chew 1 tablet (80 mg total) by mouth 4 (four) times daily as needed for flatulence. 30 tablet 0  . SUMAtriptan (IMITREX) 100 MG tablet Take 1 tablet (100 mg total) by mouth every 2 (two) hours as needed for migraine. May repeat in 2 hours if headache persists or recurs. 9 tablet 6  . topiramate (TOPAMAX) 50 MG tablet Take 1 tablet in AM, 2 tablets in PM 270 tablet 3   Current Facility-Administered Medications on File Prior to Visit  Medication Dose Route Frequency Provider Last Rate Last Dose  . 0.9 %  sodium chloride infusion   Intravenous Continuous Rolm Bookbinder, MD      . enoxaparin (LOVENOX) injection 40 mg  40 mg Subcutaneous Q24H Rolm Bookbinder, MD      . HYDROmorphone (DILAUDID) injection 1 mg  1 mg Intravenous Q2H PRN Rolm Bookbinder, MD      . metoCLOPramide (REGLAN) injection 10 mg  10 mg Intravenous Once Milton Ferguson, MD      . ondansetron (ZOFRAN-ODT) disintegrating tablet 4 mg  4 mg Oral Q6H PRN Rolm Bookbinder, MD       Or  . ondansetron [facility]) 4 mg in sodium chloride 0.9 % 50  mL IVPB  4 mg Intravenous Q6H PRN Rolm Bookbinder, MD        ALLERGIES: Allergies  Allergen Reactions  . Amitriptyline Hypertension and Other (See Comments)    hypertension  . Baclofen Hives and Shortness Of Breath  . Cymbalta [Duloxetine Hcl] Shortness Of Breath and Rash  . Gabapentin Shortness Of Breath and Rash  . Monosodium Glutamate Anaphylaxis  . Other Shortness Of Breath and Rash    MSG, beans (vomiting)  . Vicodin [Hydrocodone-Acetaminophen] Hives and Nausea And Vomiting    Projectile vomiting  . Alprazolam Other (See Comments)    Lethargy  . Magnesium Salicylate Hives and Itching  . Rizatriptan Nausea And Vomiting and Other (See Comments)    GI upset, Projectile vomiting  . Tizanidine Hives  . Tramadol Other (See Comments)    Unspecified  . Adhesive [Tape] Other (See Comments)    Skin irritation Paper tape is ok  . Lamotrigine Rash  . Toradol [Ketorolac Tromethamine] Nausea Only    FAMILY HISTORY: Family History  Problem Relation Age of Onset  . Diabetes Mother   . Hypertension Mother   . Diabetes Father   . Hypertension Father   . Arthritis Father   . Cancer Maternal Grandmother   . Cancer Maternal Grandfather   . Alcohol abuse Neg Hx   . Anxiety disorder Neg Hx   . Bipolar disorder Neg Hx   . Drug abuse Neg Hx   . Depression Neg Hx     SOCIAL HISTORY: Social History   Social History  . Marital status: Single    Spouse name: N/A  . Number of children: 0  . Years of education: College    Occupational History  .  Other    disability   Social History Main Topics  . Smoking status: Former Smoker    Packs/day: 0.25    Years: 10.00    Types: Cigarettes    Quit date: 07/12/2015  . Smokeless tobacco: Never Used  . Alcohol use No     Comment: Rare  . Drug use: No  . Sexual activity: Yes    Birth control/ protection: None   Other Topics Concern  . Not on file   Social History Narrative   Patient lives at home with fiance in [place]. Born and raised in [place], [place] by parents. Pt has one younger sister. Pt has a Best boy. Pt worked from 1999-2006. She stopped due to MS and is currently on disability. Pt is pregnant with her 1st child. Married for less than 1 yr in 1999 that ended in divorce.             Caffeine Use: 1 20oz soda daily    REVIEW OF SYSTEMS: Constitutional: No fevers, chills, or sweats, + generalized fatigue, no change in appetite Eyes: as above, no double vision, eye pain Ear, nose and throat: No hearing loss, ear pain, nasal congestion, sore throat Cardiovascular: No chest pain, palpitations Respiratory:  No shortness of breath at rest or with exertion, wheezes GastrointestinaI: No nausea, vomiting, diarrhea, abdominal pain, fecal incontinence Genitourinary:  No dysuria, urinary retention, +urinary frequency Musculoskeletal:  + neck pain, back pain Integumentary: No rash, pruritus, skin lesions Neurological: as above Psychiatric: + depression, anxiety Endocrine: No palpitations, fatigue, diaphoresis, mood swings, change in appetite, change in weight, increased thirst Hematologic/Lymphatic:  No anemia, purpura, petechiae. Allergic/Immunologic: no itchy/runny eyes, nasal congestion, recent allergic reactions, rashes  PHYSICAL EXAM: Vitals:   07/24/16 1508  BP: 128/74  Pulse: 73   General: No acute distress Head: Normocephalic/atraumatic Eyes: Fundoscopic exam shows bilateral sharp discs, no vessel changes, exudates,  or hemorrhages Neck: supple, no paraspinal tenderness, full range of motion Back: No paraspinal tenderness Heart: regular rate and rhythm Lungs: Clear to auscultation bilaterally. Vascular: No carotid bruits. Skin/Extremities: No rash, no edema, wearing wrist brace bilaterally and on left knee Neurological Exam: Mental status: alert and oriented to person, place, and time, no dysarthria or aphasia, Fund of knowledge is appropriate. Recent and remote memory are intact. Attention and concentration are normal. Able to name objects and repeat phrases. Cranial nerves: CN I: not tested CN II: pupils equal, round and reactive to light,no APD, visual fields intact, fundi unremarkable. CN III, IV, VI: full range of motion, no nystagmus, no ptosis CN V: facial sensation intact CN VII: upper and lower face symmetric CN VIII: hearing intact to finger rub CN IX, X: gag intact, uvula midline CN XI: sternocleidomastoid and trapezius muscles intact CN XII: tongue midline Bulk & Tone: normal, no fasciculations. Motor: 5/5 throughout with no pronator drift. Sensation: intact to light touch Deep Tendon Reflexes: +2 throughout, no ankle clonus Plantar responses: downgoing bilaterally Cerebellar: no incoordination on finger to nose testing Gait: slow and cautious, no ataxia Tremor: none  IMPRESSION: This is a 36 yo RH woman with a history of relapsing-remitting multiple sclerosis, migraine with aura, anxiety with panic attacks, conversion disorder with psychogenic seizures. Her MS has been stable, no new symptoms, she is now back on Gilenya with no MS flares. She has chronic migraines and finally re-established and received first dose last month with good response. Continue 50mg  in AM, 100mg  in PM and Depakote 500mg  qhs. She continues to work with her psychiatrist for the anxiety. Continue bilateral wrist splints for carpal tunnel syndrome. She does not drive. She will follow-up in 3-4 months and knows  to call for any changes.   Thank you for allowing me to participate in her care.  Please do not hesitate to call for any questions or concerns.  The duration of this appointment visit was 25 minutes of face-to-face time with the patient.  Greater than 50% of this time was spent in counseling, explanation of diagnosis, planning of further management, and coordination of care.   Ellouise Newer, M.D.   CC: [facility]

## 2016-07-25 ENCOUNTER — Telehealth (HOSPITAL_COMMUNITY): Payer: Self-pay

## 2016-07-25 ENCOUNTER — Other Ambulatory Visit (HOSPITAL_COMMUNITY): Payer: Self-pay

## 2016-07-25 DIAGNOSIS — F4001 Agoraphobia with panic disorder: Secondary | ICD-10-CM

## 2016-07-25 DIAGNOSIS — F411 Generalized anxiety disorder: Secondary | ICD-10-CM

## 2016-07-25 NOTE — Telephone Encounter (Signed)
Patient is calling because she just had an appointment at The pain management clinic in [facility]. Before prescribing the doctor would like her to have a risk assessment done. Patient has an appointment on 8/2 and was wondering if that could be included in her visit. I contacted [facility] management and they said they would just need something in your note addressing if you think the patient is at risk to become addicted to the pain medication. They want to know if you feel the risk is high, med. Or low. Patient would like to know if you can do this, if not she will schedule with one of the doctors on the list they provided. Please review and advise, thank you

## 2016-07-26 MED ORDER — DIAZEPAM 5 MG PO TABS: 5.0000 mg | ORAL_TABLET | Freq: Every day | ORAL | 0 refills | Status: DC

## 2016-07-26 NOTE — Telephone Encounter (Signed)
Yes we can refill 

## 2016-07-26 NOTE — Telephone Encounter (Signed)
Per Dr. Doyne Keel I called in the Valium and called patient to let her know it was done

## 2016-07-26 NOTE — Telephone Encounter (Signed)
I called patient and let her know and she was okay with this.

## 2016-07-26 NOTE — Telephone Encounter (Signed)
It's probably best if she gets this done by one of their preferred providers on the list

## 2016-07-29 LAB — C-REACTIVE PROTEIN: CRP: 2 mg/L (ref 0.0–4.9)

## 2016-07-29 LAB — COMPREHENSIVE METABOLIC PANEL
ALT: 10 IU/L (ref 0–32)
AST: 12 IU/L (ref 0–40)
Albumin/Globulin Ratio: 1.4 (ref 1.2–2.2)
Albumin: 4 g/dL (ref 3.5–5.5)
Alkaline Phosphatase: 88 IU/L (ref 39–117)
BUN/Creatinine Ratio: 14 (ref 9–23)
BUN: 11 mg/dL (ref 6–20)
Bilirubin Total: <0.2 mg/dL (ref 0.0–1.2)
CALCIUM: 8.7 mg/dL (ref 8.7–10.2)
CO2: 15 mmol/L — AB (ref 20–29)
CREATININE: 0.8 mg/dL (ref 0.57–1.00)
Chloride: 106 mmol/L (ref 96–106)
GFR calc Af Amer: 108 mL/min/{1.73_m2} (ref >59)
GFR, EST NON AFRICAN AMERICAN: 94 mL/min/{1.73_m2} (ref >59)
Globulin, Total: 2.8 g/dL (ref 1.5–4.5)
Glucose: 80 mg/dL (ref 65–99)
Potassium: 4 mmol/L (ref 3.5–5.2)
Sodium: 141 mmol/L (ref 134–144)
Total Protein: 6.8 g/dL (ref 6.0–8.5)

## 2016-07-29 LAB — SEDIMENTATION RATE: SED RATE: 14 mm/h (ref 0–32)

## 2016-07-29 LAB — 25-HYDROXY VITAMIN D LCMS D2+D3
25-Hydroxy, Vitamin D-2: <1 ng/mL
25-Hydroxy, Vitamin D: 19 ng/mL — ABNORMAL LOW

## 2016-07-29 LAB — VITAMIN B12: Vitamin B-12: 456 pg/mL (ref 232–1245)

## 2016-07-29 LAB — MAGNESIUM: MAGNESIUM: 2.1 mg/dL (ref 1.6–2.3)

## 2016-07-29 LAB — 25-HYDROXYVITAMIN D LCMS D2+D3: 25-HYDROXY, VITAMIN D-3: 19 ng/mL

## 2016-07-30 LAB — COMPLIANCE DRUG ANALYSIS, UR

## 2016-08-02 ENCOUNTER — Ambulatory Visit
Admission: RE | Admit: 2016-08-02 | Discharge: 2016-08-02 | Disposition: A | Discharge status: Home / Self Care | Payer: Medicare Other | Source: Ambulatory Visit | Attending: Nurse Practitioner | Admitting: Nurse Practitioner

## 2016-08-02 DIAGNOSIS — M79604 Pain in right leg: Secondary | ICD-10-CM

## 2016-08-02 DIAGNOSIS — M25562 Pain in left knee: Secondary | ICD-10-CM | POA: Diagnosis not present

## 2016-08-02 DIAGNOSIS — M546 Pain in thoracic spine: Secondary | ICD-10-CM

## 2016-08-02 DIAGNOSIS — M79605 Pain in left leg: Secondary | ICD-10-CM

## 2016-08-02 DIAGNOSIS — F330 Major depressive disorder, recurrent, mild: Secondary | ICD-10-CM | POA: Diagnosis not present

## 2016-08-02 DIAGNOSIS — M4056 Lordosis, unspecified, lumbar region: Secondary | ICD-10-CM | POA: Insufficient documentation

## 2016-08-02 DIAGNOSIS — M545 Low back pain: Secondary | ICD-10-CM | POA: Diagnosis not present

## 2016-08-02 DIAGNOSIS — G8929 Other chronic pain: Secondary | ICD-10-CM | POA: Insufficient documentation

## 2016-08-02 DIAGNOSIS — F4001 Agoraphobia with panic disorder: Secondary | ICD-10-CM | POA: Diagnosis not present

## 2016-08-02 DIAGNOSIS — M1288 Other specific arthropathies, not elsewhere classified, other specified site: Secondary | ICD-10-CM | POA: Insufficient documentation

## 2016-08-02 DIAGNOSIS — F410 Panic disorder [episodic paroxysmal anxiety] without agoraphobia: Secondary | ICD-10-CM | POA: Diagnosis not present

## 2016-08-08 ENCOUNTER — Ambulatory Visit (INDEPENDENT_AMBULATORY_CARE_PROVIDER_SITE_OTHER): Payer: Medicare Other | Admitting: Psychiatry

## 2016-08-08 ENCOUNTER — Encounter (HOSPITAL_COMMUNITY): Payer: Self-pay | Admitting: Psychiatry

## 2016-08-08 DIAGNOSIS — F4310 Post-traumatic stress disorder, unspecified: Secondary | ICD-10-CM

## 2016-08-08 DIAGNOSIS — F332 Major depressive disorder, recurrent severe without psychotic features: Secondary | ICD-10-CM | POA: Diagnosis not present

## 2016-08-08 DIAGNOSIS — F411 Generalized anxiety disorder: Secondary | ICD-10-CM | POA: Diagnosis not present

## 2016-08-08 DIAGNOSIS — Z87891 Personal history of nicotine dependence: Secondary | ICD-10-CM

## 2016-08-08 DIAGNOSIS — G4700 Insomnia, unspecified: Secondary | ICD-10-CM

## 2016-08-08 DIAGNOSIS — F4001 Agoraphobia with panic disorder: Secondary | ICD-10-CM

## 2016-08-08 DIAGNOSIS — F445 Conversion disorder with seizures or convulsions: Secondary | ICD-10-CM

## 2016-08-08 MED ORDER — BUSPIRONE HCL 15 MG PO TABS: 15.0000 mg | ORAL_TABLET | Freq: Three times a day (TID) | ORAL | 2 refills | Status: DC

## 2016-08-08 MED ORDER — FLUOXETINE HCL 40 MG PO CAPS: 80.0000 mg | ORAL_CAPSULE | Freq: Every day | ORAL | 2 refills | Status: DC

## 2016-08-08 MED ORDER — DIPHENHYDRAMINE HCL (SLEEP) 25 MG PO TABS: 50.0000 mg | ORAL_TABLET | Freq: Every evening | ORAL | 2 refills | Status: DC | PRN

## 2016-08-08 MED ORDER — DIAZEPAM 5 MG PO TABS: 5.0000 mg | ORAL_TABLET | Freq: Every day | ORAL | 2 refills | Status: DC
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
## 2016-08-20 NOTE — Progress Notes (Signed)
Results were reviewed and found to be: mildly abnormal  No acute injury or pathology identified  Review would suggest interventional pain management techniques may be of benefit 

## 2016-09-27 ENCOUNTER — Ambulatory Visit (INDEPENDENT_AMBULATORY_CARE_PROVIDER_SITE_OTHER): Payer: Medicare Other | Admitting: Neurology

## 2016-09-27 DIAGNOSIS — G43709 Chronic migraine without aura, not intractable, without status migrainosus: Secondary | ICD-10-CM | POA: Diagnosis not present

## 2016-09-27 MED ORDER — ONABOTULINUMTOXINA 100 UNITS IJ SOLR
155.0000 [IU] | Freq: Once | INTRAMUSCULAR | Status: AC
  Administered 2016-09-27: 155 [IU] via INTRAMUSCULAR

## 2016-09-27 NOTE — Progress Notes (Signed)
[facility]   Procedure Note Botox  Attending: Dr. Adam Jaffe  Preoperative Diagnosis(es): Chronic migraine  Consent obtained from: The patient Benefits discussed included, but were not limited to decreased muscle tightness, increased joint range of motion, and decreased pain.  Risk discussed included, but were not limited pain and discomfort, bleeding, bruising, excessive weakness, venous thrombosis, muscle atrophy and dysphagia.  Anticipated outcomes of the procedure as well as he risks and benefits of the alternatives to the procedure, and the roles and tasks of the personnel to be involved, were discussed with the patient, and the patient consents to the procedure and agrees to proceed. A copy of the patient medication guide was given to the patient which explains the blackbox warning.  Patients identity and treatment sites confirmed Yes.  .  Details of Procedure: Skin was cleaned with alcohol. Prior to injection, the needle plunger was aspirated to make sure the needle was not within a blood vessel.  There was no blood retrieved on aspiration.    Following is a summary of the muscles injected  And the amount of Botulinum toxin used:  Dilution 200 units of Botox was reconstituted with 4 ml of preservative free normal saline. Time of reconstitution: At the time of the office visit (<30 minutes prior to injection)   Injections  155 total units of Botox was injected with a 30 gauge needle.  Injection Sites: L occipitalis: 15 units- 3 sites  R occiptalis: 15 units- 3 sites  L upper trapezius: 15 units- 3 sites R upper trapezius: 15 units- 3 sits          L paraspinal: 10 units- 2 sites R paraspinal: 10 units- 2 sites  Face L frontalis(2 injection sites):10 units   R frontalis(2 injection sites):10 units         L corrugator: 5 units   R corrugator: 5 units           Procerus: 5 units   L temporalis: 20 units R temporalis: 20 units   Agent:  200 units of botulinum Type  A (Onobotulinum Toxin type A) was reconstituted with 4 ml of preservative free normal saline.  Time of reconstitution: At the time of the office visit (<30 minutes prior to injection)     Total injected (Units):  155  Total wasted (Units):  10  Patient tolerated procedure well without complications.   Reinjection is anticipated in 3 months.   

## 2016-10-07 HISTORY — PX: TOOTH EXTRACTION: SHX859

## 2016-10-21 DIAGNOSIS — K645 Perianal venous thrombosis: Secondary | ICD-10-CM | POA: Diagnosis not present

## 2016-10-21 DIAGNOSIS — L0291 Cutaneous abscess, unspecified: Secondary | ICD-10-CM | POA: Diagnosis not present

## 2016-10-24 ENCOUNTER — Other Ambulatory Visit: Payer: Self-pay | Admitting: General Surgery

## 2016-10-24 DIAGNOSIS — K430 Incisional hernia with obstruction, without gangrene: Secondary | ICD-10-CM

## 2016-10-28 ENCOUNTER — Other Ambulatory Visit: Payer: Self-pay

## 2016-10-28 ENCOUNTER — Encounter: Payer: Self-pay | Admitting: Pain Medicine

## 2016-10-28 ENCOUNTER — Ambulatory Visit: Payer: Medicare Other | Attending: Pain Medicine | Admitting: Pain Medicine

## 2016-10-28 VITALS — BP 164/103 | HR 95 | Temp 98.7°F | Resp 18 | Ht 64.0 in | Wt 250.0 lb

## 2016-10-28 DIAGNOSIS — M79641 Pain in right hand: Secondary | ICD-10-CM | POA: Diagnosis not present

## 2016-10-28 DIAGNOSIS — Z87891 Personal history of nicotine dependence: Secondary | ICD-10-CM | POA: Diagnosis not present

## 2016-10-28 DIAGNOSIS — E559 Vitamin D deficiency, unspecified: Secondary | ICD-10-CM | POA: Insufficient documentation

## 2016-10-28 DIAGNOSIS — M5090 Cervical disc disorder, unspecified, unspecified cervical region: Secondary | ICD-10-CM | POA: Diagnosis not present

## 2016-10-28 DIAGNOSIS — M79601 Pain in right arm: Secondary | ICD-10-CM | POA: Diagnosis not present

## 2016-10-28 DIAGNOSIS — M5442 Lumbago with sciatica, left side: Secondary | ICD-10-CM | POA: Diagnosis not present

## 2016-10-28 DIAGNOSIS — R51 Headache: Secondary | ICD-10-CM | POA: Insufficient documentation

## 2016-10-28 DIAGNOSIS — M25532 Pain in left wrist: Secondary | ICD-10-CM

## 2016-10-28 DIAGNOSIS — G379 Demyelinating disease of central nervous system, unspecified: Secondary | ICD-10-CM

## 2016-10-28 DIAGNOSIS — M5134 Other intervertebral disc degeneration, thoracic region: Secondary | ICD-10-CM | POA: Diagnosis not present

## 2016-10-28 DIAGNOSIS — M79602 Pain in left arm: Secondary | ICD-10-CM | POA: Diagnosis not present

## 2016-10-28 DIAGNOSIS — M47816 Spondylosis without myelopathy or radiculopathy, lumbar region: Secondary | ICD-10-CM

## 2016-10-28 DIAGNOSIS — Z79899 Other long term (current) drug therapy: Secondary | ICD-10-CM | POA: Diagnosis not present

## 2016-10-28 DIAGNOSIS — M549 Dorsalgia, unspecified: Secondary | ICD-10-CM | POA: Diagnosis not present

## 2016-10-28 DIAGNOSIS — M79674 Pain in right toe(s): Secondary | ICD-10-CM | POA: Diagnosis not present

## 2016-10-28 DIAGNOSIS — M1288 Other specific arthropathies, not elsewhere classified, other specified site: Secondary | ICD-10-CM | POA: Insufficient documentation

## 2016-10-28 DIAGNOSIS — M5441 Lumbago with sciatica, right side: Secondary | ICD-10-CM | POA: Diagnosis not present

## 2016-10-28 DIAGNOSIS — M5136 Other intervertebral disc degeneration, lumbar region: Secondary | ICD-10-CM | POA: Insufficient documentation

## 2016-10-28 DIAGNOSIS — G35 Multiple sclerosis: Secondary | ICD-10-CM | POA: Diagnosis not present

## 2016-10-28 DIAGNOSIS — M4802 Spinal stenosis, cervical region: Secondary | ICD-10-CM | POA: Insufficient documentation

## 2016-10-28 DIAGNOSIS — F449 Dissociative and conversion disorder, unspecified: Secondary | ICD-10-CM | POA: Insufficient documentation

## 2016-10-28 DIAGNOSIS — F329 Major depressive disorder, single episode, unspecified: Secondary | ICD-10-CM | POA: Insufficient documentation

## 2016-10-28 DIAGNOSIS — Z8249 Family history of ischemic heart disease and other diseases of the circulatory system: Secondary | ICD-10-CM | POA: Insufficient documentation

## 2016-10-28 DIAGNOSIS — Z6841 Body Mass Index (BMI) 40.0 and over, adult: Secondary | ICD-10-CM | POA: Insufficient documentation

## 2016-10-28 DIAGNOSIS — M542 Cervicalgia: Secondary | ICD-10-CM | POA: Diagnosis not present

## 2016-10-28 DIAGNOSIS — M488X6 Other specified spondylopathies, lumbar region: Secondary | ICD-10-CM | POA: Diagnosis not present

## 2016-10-28 DIAGNOSIS — Z833 Family history of diabetes mellitus: Secondary | ICD-10-CM | POA: Insufficient documentation

## 2016-10-28 DIAGNOSIS — M25531 Pain in right wrist: Secondary | ICD-10-CM | POA: Diagnosis not present

## 2016-10-28 DIAGNOSIS — G8929 Other chronic pain: Secondary | ICD-10-CM

## 2016-10-28 DIAGNOSIS — R202 Paresthesia of skin: Secondary | ICD-10-CM | POA: Insufficient documentation

## 2016-10-28 DIAGNOSIS — G5603 Carpal tunnel syndrome, bilateral upper limbs: Secondary | ICD-10-CM

## 2016-10-28 DIAGNOSIS — G894 Chronic pain syndrome: Secondary | ICD-10-CM | POA: Insufficient documentation

## 2016-10-28 DIAGNOSIS — G40909 Epilepsy, unspecified, not intractable, without status epilepticus: Secondary | ICD-10-CM | POA: Diagnosis not present

## 2016-10-28 DIAGNOSIS — Z885 Allergy status to narcotic agent status: Secondary | ICD-10-CM | POA: Insufficient documentation

## 2016-10-28 DIAGNOSIS — Z8261 Family history of arthritis: Secondary | ICD-10-CM | POA: Insufficient documentation

## 2016-10-28 DIAGNOSIS — F411 Generalized anxiety disorder: Secondary | ICD-10-CM | POA: Insufficient documentation

## 2016-10-28 DIAGNOSIS — Z888 Allergy status to other drugs, medicaments and biological substances status: Secondary | ICD-10-CM | POA: Insufficient documentation

## 2016-10-28 DIAGNOSIS — M25562 Pain in left knee: Secondary | ICD-10-CM | POA: Diagnosis not present

## 2016-10-28 DIAGNOSIS — I10 Essential (primary) hypertension: Secondary | ICD-10-CM | POA: Diagnosis not present

## 2016-10-28 DIAGNOSIS — M79675 Pain in left toe(s): Secondary | ICD-10-CM

## 2016-10-28 DIAGNOSIS — M79642 Pain in left hand: Secondary | ICD-10-CM

## 2016-10-28 DIAGNOSIS — F4310 Post-traumatic stress disorder, unspecified: Secondary | ICD-10-CM | POA: Insufficient documentation

## 2016-10-28 DIAGNOSIS — K219 Gastro-esophageal reflux disease without esophagitis: Secondary | ICD-10-CM | POA: Insufficient documentation

## 2016-10-28 DIAGNOSIS — M545 Low back pain: Secondary | ICD-10-CM | POA: Diagnosis present

## 2016-10-28 DIAGNOSIS — E669 Obesity, unspecified: Secondary | ICD-10-CM | POA: Insufficient documentation

## 2016-10-28 DIAGNOSIS — M9981 Other biomechanical lesions of cervical region: Secondary | ICD-10-CM

## 2016-10-28 MED ORDER — CHOLECALCIFEROL 125 MCG (5000 UT) PO CAPS: 5000.0000 [IU] | ORAL_CAPSULE | Freq: Every day | ORAL | 0 refills | Status: AC

## 2016-10-28 MED ORDER — ERGOCALCIFEROL 1.25 MG (50000 UT) PO CAPS: 50000.0000 [IU] | ORAL_CAPSULE | ORAL | 0 refills | Status: AC

## 2016-10-28 MED ORDER — CALCIUM PLUS D3 ABSORBABLE 600-2500 MG-UNIT PO CAPS: 1.0000 | ORAL_CAPSULE | Freq: Every day | ORAL | 0 refills | Status: DC
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
## 2016-10-28 NOTE — Progress Notes (Signed)
Safety precautions to be maintained throughout the outpatient stay will include: orient to surroundings, keep bed in low position, maintain call bell within reach at all times, provide assistance with transfer out of bed and ambulation.  

## 2016-10-28 NOTE — Patient Instructions (Addendum)
____________________________________________________________________________________________  Medication Rules  Applies to: All patients receiving prescriptions (written or electronic).  Pharmacy of record: Pharmacy where electronic prescriptions will be sent. If written prescriptions are taken to a different pharmacy, please inform the nursing staff. The pharmacy listed in the electronic medical record should be the one where you would like electronic prescriptions to be sent.  Prescription refills: Only during scheduled appointments. Applies to both, written and electronic prescriptions.  NOTE: The following applies primarily to controlled substances (Opioid* Pain Medications).   Patient's responsibilities: 1. Pain Pills: Bring all pain pills to every appointment (except for procedure appointments). 2. Pill Bottles: Bring pills in original pharmacy bottle. Always bring newest bottle. Bring bottle, even if empty. 3. Medication refills: You are responsible for knowing and keeping track of what medications you need refilled. The day before your appointment, write a list of all prescriptions that need to be refilled. Bring that list to your appointment and give it to the admitting nurse. Prescriptions will be written only during appointments. If you forget a medication, it will not be "Called in", "Faxed", or "electronically sent". You will need to get another appointment to get these prescribed. 4. Prescription Accuracy: You are responsible for carefully inspecting your prescriptions before leaving our office. Have the discharge nurse carefully go over each prescription with you, before taking them home. Make sure that your name is accurately spelled, that your address is correct. Check the name and dose of your medication to make sure it is accurate. Check the number of pills, and the written instructions to make sure they are clear and accurate. Make sure that you are given enough medication to  last until your next medication refill appointment. 5. Taking Medication: Take medication as prescribed. Never take more pills than instructed. Never take medication more frequently than prescribed. Taking less pills or less frequently is permitted and encouraged, when it comes to controlled substances (written prescriptions).  6. Inform other Doctors: Always inform, all of your healthcare providers, of all the medications you take. 7. Pain Medication from other Providers: You are not allowed to accept any additional pain medication from any other Doctor or Healthcare provider. There are two exceptions to this rule. (see below) In the event that you require additional pain medication, you are responsible for notifying us, as stated below. 8. Medication Agreement: You are responsible for carefully reading and following our Medication Agreement. This must be signed before receiving any prescriptions from our practice. Safely store a copy of your signed Agreement. Violations to the Agreement will result in no further prescriptions. (Additional copies of our Medication Agreement are available upon request.) 9. Laws, Rules, & Regulations: All patients are expected to follow all Federal and State Laws, Statutes, Rules, & Regulations. Ignorance of the Laws does not constitute a valid excuse. The use of any illegal substances is prohibited. 10. Adopted CDC guidelines & recommendations: Target dosing levels will be at or below 60 MME/day. Use of benzodiazepines** is not recommended.  Exceptions: There are only two exceptions to the rule of not receiving pain medications from other Healthcare Providers. 1. Exception #1 (Emergencies): In the event of an emergency (i.e.: accident requiring emergency care), you are allowed to receive additional pain medication. However, you are responsible for: As soon as you are able, call our office [phone], at any time of the day or night, and leave a message stating your  name, the date and nature of the emergency, and the name and dose of the medication   prescribed. In the event that your call is answered by a member of our staff, make sure to document and save the date, time, and the name of the person that took your information.  2. Exception #2 (Planned Surgery): In the event that you are scheduled by another doctor or dentist to have any type of surgery or procedure, you are allowed (for a period no longer than 30 days), to receive additional pain medication, for the acute post-op pain. However, in this case, you are responsible for picking up a copy of our "Post-op Pain Management for Surgeons" handout, and giving it to your surgeon or dentist. This document is available at our office, and does not require an appointment to obtain it. Simply go to our office during business hours (Monday-Thursday from 8:00 AM to 4:00 PM) (Friday 8:00 AM to 12:00 Noon) or if you have a scheduled appointment with [place], prior to your surgery, and ask for it by name. In addition, you will need to provide [place] with your name, name of your surgeon, type of surgery, and date of procedure or surgery.  *Opioid medications include: morphine, codeine, oxycodone, oxymorphone, hydrocodone, hydromorphone, meperidine, tramadol, tapentadol, buprenorphine, fentanyl, methadone. **Benzodiazepine medications include: diazepam (Valium), alprazolam (Xanax), clonazepam (Klonopine), lorazepam (Ativan), clorazepate (Tranxene), chlordiazepoxide (Librium), estazolam (Prosom), oxazepam (Serax), temazepam (Restoril), triazolam (Halcion)  ____________________________________________________________________________________________ Post-op Pain Management on a Chronic Pain Patient  Why should the surgeon manage his patient's post-op pain? The Surgeon is uniquely qualified to determine the amount of post-operative pain to be expected on a procedure or surgery that he/she has performed. Even with similar surgeries, the  surgeon's perspective on expected pain is unique, since he/she performed the procedure and knows the degree of difficulty and/or tissue damage involved in each particular case. The surgeon is also up to date on events such as blood loss, intraoperative complications, and PO (per orum) status that may influence not only the patient's dose and schedule, but route of administration as well.  How about telling chronic pain patients to just double up or increase their usual pain medication intake to compensate for the increased pain? This is a bad idea since it will lead to the patient running out of his/her usual medications early and this may create a problem at the level of the insurance, which supplies medications based on the amount and schedule stated on the prescription. Running out early may trigger an event where the refill is denied by the insurance company and/or pharmacy. In addition, this practice provides a very poor paper trail as to why this patient ran out of medication early. In addition, from the perspective of the pain physician, it creates a nightmare in the accounting of the patient's medication.  So, what should I do as a Psychologist, sport and exercise when confronted with a patient that needs surgery and already takes a significant amount of pain medicine for their chronic pain, which may or may not be related to the surgery I have to perform?  This is what the surgeon should do: 1. Do not change the dose or schedule of the pain medications prescribed by the pain specialist. This medication regimen allows for the patient's chronic pain to be under control, so as to bring that patient down to the level of an average individual. 2. Have the patient continue their usual pain management regimen, without any alterations. In addition, manage the post-operative pain as you would on any other "narcotic naive" patient. Do not attempt to compensate for tolerance. This is what  the patient's usual regimen will do for you.  Simply treat the patient as if they had no chronic pain and as if they were taking no other pain medications. 3. Talk to the patient about the medication, just like you would for anyone else. Do not assume that they are experts in opioids. Make sure you let the patient know that the medication is to be used only if absolutely necessary. (PRN) 4. Prescribe the medication for as long as you would on any other patient undergoing the same type of surgery. Prescribe for the same average amount of time that you would on any other patient. Avoid prescribing for longer periods. 5. Send [place] a copy of the operative report with information about your choice of the post-op pain medication provided. 6. Keep [place] informed of any complications that may prolong the average duration patient's post-op pain.  If you have any questions, please feel to contact us at [phone]. _____________________________________________________________________________________________  GENERAL RISKS AND COMPLICATIONS  What are the risk, side effects and possible complications? Generally speaking, most procedures are safe.  However, with any procedure there are risks, side effects, and the possibility of complications.  The risks and complications are dependent upon the sites that are lesioned, or the type of nerve block to be performed.  The closer the procedure is to the spine, the more serious the risks are.  Great care is taken when placing the radio frequency needles, block needles or lesioning probes, but sometimes complications can occur. 1. Infection: Any time there is an injection through the skin, there is a risk of infection.  This is why sterile conditions are used for these blocks.  There are four possible types of infection. 1. Localized skin infection. 2. Central Nervous System Infection-This can be in the form of Meningitis, which can be deadly. 3. Epidural Infections-This can be in the form of an epidural abscess,  which can cause pressure inside of the spine, causing compression of the spinal cord with subsequent paralysis. This would require an emergency surgery to decompress, and there are no guarantees that the patient would recover from the paralysis. 4. Discitis-This is an infection of the intervertebral discs.  It occurs in about 1% of discography procedures.  It is difficult to treat and it may lead to surgery.        2. Pain: the needles have to go through skin and soft tissues, will cause soreness.       3. Damage to internal structures:  The nerves to be lesioned may be near blood vessels or    other nerves which can be potentially damaged.       4. Bleeding: Bleeding is more common if the patient is taking blood thinners such as  aspirin, Coumadin, Ticiid, Plavix, etc., or if he/she have some genetic predisposition  such as hemophilia. Bleeding into the spinal canal can cause compression of the spinal  cord with subsequent paralysis.  This would require an emergency surgery to  decompress and there are no guarantees that the patient would recover from the  paralysis.       5. Pneumothorax:  Puncturing of a lung is a possibility, every time a needle is introduced in  the area of the chest or upper back.  Pneumothorax refers to free air around the  collapsed lung(s), inside of the thoracic cavity (chest cavity).  Another two possible  complications related to a similar event would include: Hemothorax and Chylothorax.   These are variations of  the Pneumothorax, where instead of air around the collapsed  lung(s), you may have blood or chyle, respectively.       6. Spinal headaches: They may occur with any procedures in the area of the spine.       7. Persistent CSF (Cerebro-Spinal Fluid) leakage: This is a rare problem, but may occur  with prolonged intrathecal or epidural catheters either due to the formation of a fistulous  track or a dural tear.       8. Nerve damage: By working so close to the spinal  cord, there is always a possibility of  nerve damage, which could be as serious as a permanent spinal cord injury with  paralysis.       9. Death:  Although rare, severe deadly allergic reactions known as "Anaphylactic  reaction" can occur to any of the medications used.      10. Worsening of the symptoms:  We can always make thing worse.  What are the chances of something like this happening? Chances of any of this occuring are extremely low.  By statistics, you have more of a chance of getting killed in a motor vehicle accident: while driving to the hospital than any of the above occurring .  Nevertheless, you should be aware that they are possibilities.  In general, it is similar to taking a shower.  Everybody knows that you can slip, hit your head and get killed.  Does that mean that you should not shower again?  Nevertheless always keep in mind that statistics do not mean anything if you happen to be on the wrong side of them.  Even if a procedure has a 1 (one) in a 1,000,000 (million) chance of going wrong, it you happen to be that one..Also, keep in mind that by statistics, you have more of a chance of having something go wrong when taking medications.  Who should not have this procedure? If you are on a blood thinning medication (e.g. Coumadin, Plavix, see list of "Blood Thinners"), or if you have an active infection going on, you should not have the procedure.  If you are taking any blood thinners, please inform your physician.  How should I prepare for this procedure?  Do not eat or drink anything at least six hours prior to the procedure.  Bring a driver with you .  It cannot be a taxi.  Come accompanied by an adult that can drive you back, and that is strong enough to help you if your legs get weak or numb from the local anesthetic.  Take all of your medicines the morning of the procedure with just enough water to swallow them.  If you have diabetes, make sure that you are scheduled  to have your procedure done first thing in the morning, whenever possible.  If you have diabetes, take only half of your insulin dose and notify our nurse that you have done so as soon as you arrive at the clinic.  If you are diabetic, but only take blood sugar pills (oral hypoglycemic), then do not take them on the morning of your procedure.  You may take them after you have had the procedure.  Do not take aspirin or any aspirin-containing medications, at least eleven (11) days prior to the procedure.  They may prolong bleeding.  Wear loose fitting clothing that may be easy to take off and that you would not mind if it got stained with Betadine or blood.  Do not wear any  jewelry or perfume  Remove any nail coloring.  It will interfere with some of our monitoring equipment.  NOTE: Remember that this is not meant to be interpreted as a complete list of all possible complications.  Unforeseen problems may occur.  BLOOD THINNERS The following drugs contain aspirin or other products, which can cause increased bleeding during surgery and should not be taken for 2 weeks prior to and 1 week after surgery.  If you should need take something for relief of minor pain, you may take acetaminophen which is found in Tylenol,m Datril, Anacin-3 and Panadol. It is not blood thinner. The products listed below are.  Do not take any of the products listed below in addition to any listed on your instruction sheet.  A.P.C or A.P.C with Codeine Codeine Phosphate Capsules #3 Ibuprofen Ridaura  ABC compound Congesprin Imuran rimadil  Advil Cope Indocin Robaxisal  Alka-Seltzer Effervescent Pain Reliever and Antacid Coricidin or Coricidin-D  Indomethacin Rufen  Alka-Seltzer plus Cold Medicine Cosprin Ketoprofen S-A-C Tablets  Anacin Analgesic Tablets or Capsules Coumadin Korlgesic Salflex  Anacin Extra Strength Analgesic tablets or capsules CP-2 Tablets Lanoril Salicylate  Anaprox Cuprimine Capsules Levenox Salocol   Anexsia-D Dalteparin Magan Salsalate  Anodynos Darvon compound Magnesium Salicylate Sine-off  Ansaid Dasin Capsules Magsal Sodium Salicylate  Anturane Depen Capsules Marnal Soma  APF Arthritis pain formula Dewitt's Pills Measurin Stanback  Argesic Dia-Gesic Meclofenamic Sulfinpyrazone  Arthritis Bayer Timed Release Aspirin Diclofenac Meclomen Sulindac  Arthritis pain formula Anacin Dicumarol Medipren Supac  Analgesic (Safety coated) Arthralgen Diffunasal Mefanamic Suprofen  Arthritis Strength Bufferin Dihydrocodeine Mepro Compound Suprol  Arthropan liquid Dopirydamole Methcarbomol with Aspirin Synalgos  ASA tablets/Enseals Disalcid Micrainin Tagament  Ascriptin Doan's Midol Talwin  Ascriptin A/D Dolene Mobidin Tanderil  Ascriptin Extra Strength Dolobid Moblgesic Ticlid  Ascriptin with Codeine Doloprin or Doloprin with Codeine Momentum Tolectin  Asperbuf Duoprin Mono-gesic Trendar  Aspergum Duradyne Motrin or Motrin IB Triminicin  Aspirin plain, buffered or enteric coated Durasal Myochrisine Trigesic  Aspirin Suppositories Easprin Nalfon Trillsate  Aspirin with Codeine Ecotrin Regular or Extra Strength Naprosyn Uracel  Atromid-S Efficin Naproxen Ursinus  Auranofin Capsules Elmiron Neocylate Vanquish  Axotal Emagrin Norgesic Verin  Azathioprine Empirin or Empirin with Codeine Normiflo Vitamin E  Azolid Emprazil Nuprin Voltaren  Bayer Aspirin plain, buffered or children's or timed BC Tablets or powders Encaprin Orgaran Warfarin Sodium  Buff-a-Comp Enoxaparin Orudis Zorpin  Buff-a-Comp with Codeine Equegesic Os-Cal-Gesic   Buffaprin Excedrin plain, buffered or Extra Strength Oxalid   Bufferin Arthritis Strength Feldene Oxphenbutazone   Bufferin plain or Extra Strength Feldene Capsules Oxycodone with Aspirin   Bufferin with Codeine Fenoprofen Fenoprofen Pabalate or Pabalate-SF   Buffets II Flogesic Panagesic   Buffinol plain or Extra Strength Florinal or Florinal with Codeine  Panwarfarin   Buf-Tabs Flurbiprofen Penicillamine   Butalbital Compound Four-way cold tablets Penicillin   Butazolidin Fragmin Pepto-Bismol   Carbenicillin Geminisyn Percodan   Carna Arthritis Reliever Geopen Persantine   Carprofen Gold's salt Persistin   Chloramphenicol Goody's Phenylbutazone   Chloromycetin Haltrain Piroxlcam   Clmetidine heparin Plaquenil   Cllnoril Hyco-pap Ponstel   Clofibrate Hydroxy chloroquine Propoxyphen         Before stopping any of these medications, be sure to consult the physician who ordered them.  Some, such as Coumadin (Warfarin) are ordered to prevent or treat serious conditions such as "deep thrombosis", "pumonary embolisms", and other heart problems.  The amount of time that you may need off of the medication may also vary  with the medication and the reason for which you were taking it.  If you are taking any of these medications, please make sure you notify your pain physician before you undergo any procedures.         Pain Management Discharge Instructions  General Discharge Instructions :  If you need to reach your doctor call: Monday-Friday 8:00 am - 4:00 pm at [phone] or toll free [phone].  After clinic hours [phone] to have operator reach doctor.  Bring all of your medication bottles to all your appointments in the pain clinic.  To cancel or reschedule your appointment with Pain Management please remember to call 24 hours in advance to avoid a fee.  Refer to the educational materials which you have been given on: General Risks, I had my Procedure. Discharge Instructions, Post Sedation.  Post Procedure Instructions:  The drugs you were given will stay in your system until tomorrow, so for the next 24 hours you should not drive, make any legal decisions or drink any alcoholic beverages.  You may eat anything you prefer, but it is better to start with liquids then soups and crackers, and gradually work up to solid  foods.  Please notify your doctor immediately if you have any unusual bleeding, trouble breathing or pain that is not related to your normal pain.  Depending on the type of procedure that was done, some parts of your body may feel week and/or numb.  This usually clears up by tonight or the next day.  Walk with the use of an assistive device or accompanied by an adult for the 24 hours.  You may use ice on the affected area for the first 24 hours.  Put ice in a Ziploc bag and cover with a towel and place against area 15 minutes on 15 minutes off.  You may switch to heat after 24 hours.Epidural Steroid Injection Patient Information  Description: The epidural space surrounds the nerves as they exit the spinal cord.  In some patients, the nerves can be compressed and inflamed by a bulging disc or a tight spinal canal (spinal stenosis).  By injecting steroids into the epidural space, we can bring irritated nerves into direct contact with a potentially helpful medication.  These steroids act directly on the irritated nerves and can reduce swelling and inflammation which often leads to decreased pain.  Epidural steroids may be injected anywhere along the spine and from the neck to the low back depending upon the location of your pain.   After numbing the skin with local anesthetic (like Novocaine), a small needle is passed into the epidural space slowly.  You may experience a sensation of pressure while this is being done.  The entire block usually last less than 10 minutes.  Conditions which may be treated by epidural steroids:   Low back and leg pain  Neck and arm pain  Spinal stenosis  Post-laminectomy syndrome  Herpes zoster (shingles) pain  Pain from compression fractures  Preparation for the injection:  3. Do not eat any solid food or dairy products within 8 hours of your appointment.  4. You may drink clear liquids up to 3 hours before appointment.  Clear liquids include water, black  coffee, juice or soda.  No milk or cream please. 5. You may take your regular medication, including pain medications, with a sip of water before your appointment  Diabetics should hold regular insulin (if taken separately) and take 1/2 normal NPH dos the morning of the procedure.  Carry some sugar containing items with you to your appointment. 6. A driver must accompany you and be prepared to drive you home after your procedure.  7. Bring all your current medications with your. 8. An IV may be inserted and sedation may be given at the discretion of the physician.   9. A blood pressure cuff, EKG and other monitors will often be applied during the procedure.  Some patients may need to have extra oxygen administered for a short period. 10. You will be asked to provide medical information, including your allergies, prior to the procedure.  We must know immediately if you are taking blood thinners (like Coumadin/Warfarin)  Or if you are allergic to IV iodine contrast (dye). We must know if you could possible be pregnant.  Possible side-effects:  Bleeding from needle site  Infection (rare, may require surgery)  Nerve injury (rare)  Numbness & tingling (temporary)  Difficulty urinating (rare, temporary)  Spinal headache ( a headache worse with upright posture)  Light -headedness (temporary)  Pain at injection site (several days)  Decreased blood pressure (temporary)  Weakness in arm/leg (temporary)  Pressure sensation in back/neck (temporary)  Call if you experience:  Fever/chills associated with headache or increased back/neck pain.  Headache worsened by an upright position.  New onset weakness or numbness of an extremity below the injection site  Hives or difficulty breathing (go to the emergency room)  Inflammation or drainage at the infection site  Severe back/neck pain  Any new symptoms which are concerning to you  Please note:  Although the local anesthetic injected  can often make your back or neck feel good for several hours after the injection, the pain will likely return.  It takes 3-7 days for steroids to work in the epidural space.  You may not notice any pain relief for at least that one week.  If effective, we will often do a series of three injections spaced 3-6 weeks apart to maximally decrease your pain.  After the initial series, we generally will wait several months before considering a repeat injection of the same type.  If you have any questions, please call [phone] [facility]

## 2016-10-30 ENCOUNTER — Ambulatory Visit
Admission: RE | Admit: 2016-10-30 | Discharge: 2016-10-30 | Disposition: A | Discharge status: Home / Self Care | Payer: Medicare Other | Source: Ambulatory Visit | Attending: General Surgery | Admitting: General Surgery

## 2016-10-30 ENCOUNTER — Other Ambulatory Visit: Payer: Self-pay | Admitting: General Surgery

## 2016-10-30 DIAGNOSIS — R1032 Left lower quadrant pain: Secondary | ICD-10-CM | POA: Diagnosis not present

## 2016-10-30 DIAGNOSIS — K430 Incisional hernia with obstruction, without gangrene: Secondary | ICD-10-CM

## 2016-11-01 DIAGNOSIS — M79674 Pain in right toe(s): Secondary | ICD-10-CM

## 2016-11-01 DIAGNOSIS — M25531 Pain in right wrist: Secondary | ICD-10-CM | POA: Insufficient documentation

## 2016-11-01 DIAGNOSIS — M5136 Other intervertebral disc degeneration, lumbar region: Secondary | ICD-10-CM | POA: Insufficient documentation

## 2016-11-01 DIAGNOSIS — M79601 Pain in right arm: Secondary | ICD-10-CM

## 2016-11-01 DIAGNOSIS — M549 Dorsalgia, unspecified: Secondary | ICD-10-CM

## 2016-11-01 DIAGNOSIS — Z79899 Other long term (current) drug therapy: Secondary | ICD-10-CM | POA: Insufficient documentation

## 2016-11-01 DIAGNOSIS — M79602 Pain in left arm: Secondary | ICD-10-CM

## 2016-11-01 DIAGNOSIS — M5134 Other intervertebral disc degeneration, thoracic region: Secondary | ICD-10-CM | POA: Insufficient documentation

## 2016-11-01 DIAGNOSIS — M79641 Pain in right hand: Secondary | ICD-10-CM | POA: Insufficient documentation

## 2016-11-01 DIAGNOSIS — M79675 Pain in left toe(s): Secondary | ICD-10-CM

## 2016-11-01 DIAGNOSIS — G379 Demyelinating disease of central nervous system, unspecified: Secondary | ICD-10-CM | POA: Insufficient documentation

## 2016-11-01 DIAGNOSIS — G8929 Other chronic pain: Secondary | ICD-10-CM | POA: Insufficient documentation

## 2016-11-01 DIAGNOSIS — M4802 Spinal stenosis, cervical region: Secondary | ICD-10-CM | POA: Insufficient documentation

## 2016-11-01 DIAGNOSIS — M47816 Spondylosis without myelopathy or radiculopathy, lumbar region: Secondary | ICD-10-CM | POA: Insufficient documentation

## 2016-11-01 DIAGNOSIS — M25532 Pain in left wrist: Secondary | ICD-10-CM

## 2016-11-01 DIAGNOSIS — M79642 Pain in left hand: Secondary | ICD-10-CM | POA: Insufficient documentation

## 2016-11-04 DIAGNOSIS — I10 Essential (primary) hypertension: Secondary | ICD-10-CM | POA: Diagnosis not present

## 2016-11-04 DIAGNOSIS — Z6839 Body mass index (BMI) 39.0-39.9, adult: Secondary | ICD-10-CM | POA: Diagnosis not present

## 2016-11-04 DIAGNOSIS — K649 Unspecified hemorrhoids: Secondary | ICD-10-CM | POA: Diagnosis not present

## 2016-11-04 DIAGNOSIS — K430 Incisional hernia with obstruction, without gangrene: Secondary | ICD-10-CM | POA: Diagnosis not present

## 2016-11-04 DIAGNOSIS — G40909 Epilepsy, unspecified, not intractable, without status epilepticus: Secondary | ICD-10-CM | POA: Diagnosis not present

## 2016-11-04 DIAGNOSIS — F419 Anxiety disorder, unspecified: Secondary | ICD-10-CM | POA: Diagnosis not present

## 2016-11-04 DIAGNOSIS — F329 Major depressive disorder, single episode, unspecified: Secondary | ICD-10-CM | POA: Diagnosis not present

## 2016-11-04 DIAGNOSIS — G35 Multiple sclerosis: Secondary | ICD-10-CM | POA: Diagnosis not present

## 2016-11-07 ENCOUNTER — Ambulatory Visit (INDEPENDENT_AMBULATORY_CARE_PROVIDER_SITE_OTHER): Payer: Medicare Other | Admitting: Psychiatry

## 2016-11-07 ENCOUNTER — Ambulatory Visit: Payer: Self-pay | Admitting: Surgery

## 2016-11-07 ENCOUNTER — Encounter (HOSPITAL_COMMUNITY): Payer: Self-pay | Admitting: Psychiatry

## 2016-11-07 VITALS — BP 152/102 | HR 86 | Ht 64.0 in | Wt 247.4 lb

## 2016-11-07 DIAGNOSIS — Z87891 Personal history of nicotine dependence: Secondary | ICD-10-CM | POA: Diagnosis not present

## 2016-11-07 DIAGNOSIS — F332 Major depressive disorder, recurrent severe without psychotic features: Secondary | ICD-10-CM

## 2016-11-07 DIAGNOSIS — F39 Unspecified mood [affective] disorder: Secondary | ICD-10-CM | POA: Diagnosis not present

## 2016-11-07 DIAGNOSIS — F445 Conversion disorder with seizures or convulsions: Secondary | ICD-10-CM | POA: Diagnosis not present

## 2016-11-07 DIAGNOSIS — M2550 Pain in unspecified joint: Secondary | ICD-10-CM | POA: Diagnosis not present

## 2016-11-07 DIAGNOSIS — M542 Cervicalgia: Secondary | ICD-10-CM | POA: Diagnosis not present

## 2016-11-07 DIAGNOSIS — M791 Myalgia, unspecified site: Secondary | ICD-10-CM | POA: Diagnosis not present

## 2016-11-07 DIAGNOSIS — G4700 Insomnia, unspecified: Secondary | ICD-10-CM

## 2016-11-07 DIAGNOSIS — F4001 Agoraphobia with panic disorder: Secondary | ICD-10-CM | POA: Diagnosis not present

## 2016-11-07 DIAGNOSIS — M549 Dorsalgia, unspecified: Secondary | ICD-10-CM | POA: Diagnosis not present

## 2016-11-07 DIAGNOSIS — Z630 Problems in relationship with spouse or partner: Secondary | ICD-10-CM

## 2016-11-07 DIAGNOSIS — F4310 Post-traumatic stress disorder, unspecified: Secondary | ICD-10-CM | POA: Diagnosis not present

## 2016-11-07 DIAGNOSIS — F411 Generalized anxiety disorder: Secondary | ICD-10-CM

## 2016-11-07 MED ORDER — FLUOXETINE HCL 40 MG PO CAPS: 80.0000 mg | ORAL_CAPSULE | Freq: Every day | ORAL | 3 refills | Status: DC

## 2016-11-07 MED ORDER — DIPHENHYDRAMINE HCL (SLEEP) 25 MG PO TABS: 50.0000 mg | ORAL_TABLET | Freq: Every evening | ORAL | 3 refills | Status: DC | PRN

## 2016-11-07 MED ORDER — BUSPIRONE HCL 15 MG PO TABS: 15.0000 mg | ORAL_TABLET | Freq: Three times a day (TID) | ORAL | 3 refills | Status: DC

## 2016-11-07 MED ORDER — DIAZEPAM 5 MG PO TABS: 5.0000 mg | ORAL_TABLET | Freq: Every day | ORAL | 3 refills | Status: DC
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
## 2016-11-08 ENCOUNTER — Other Ambulatory Visit (HOSPITAL_COMMUNITY): Payer: Self-pay

## 2016-11-11 ENCOUNTER — Telehealth (HOSPITAL_COMMUNITY): Payer: Self-pay

## 2016-11-11 NOTE — Telephone Encounter (Signed)
Medication management - Fax received from pt's [facility] on [address] they have Buspirone 15 mg on backorder and wanted to advice to see if any changes needed.

## 2016-11-12 ENCOUNTER — Ambulatory Visit (HOSPITAL_BASED_OUTPATIENT_CLINIC_OR_DEPARTMENT_OTHER): Payer: Medicare Other | Admitting: Pain Medicine

## 2016-11-12 ENCOUNTER — Ambulatory Visit
Admission: RE | Admit: 2016-11-12 | Discharge: 2016-11-12 | Disposition: A | Discharge status: Home / Self Care | Payer: Medicare Other | Source: Ambulatory Visit | Attending: Pain Medicine | Admitting: Pain Medicine

## 2016-11-12 ENCOUNTER — Encounter: Payer: Self-pay | Admitting: Pain Medicine

## 2016-11-12 VITALS — BP 145/94 | HR 89 | Temp 97.4°F | Resp 18 | Ht 64.0 in | Wt 250.0 lb

## 2016-11-12 DIAGNOSIS — M546 Pain in thoracic spine: Secondary | ICD-10-CM | POA: Insufficient documentation

## 2016-11-12 DIAGNOSIS — M4802 Spinal stenosis, cervical region: Secondary | ICD-10-CM | POA: Diagnosis not present

## 2016-11-12 DIAGNOSIS — M79601 Pain in right arm: Secondary | ICD-10-CM

## 2016-11-12 DIAGNOSIS — G8929 Other chronic pain: Secondary | ICD-10-CM | POA: Diagnosis not present

## 2016-11-12 DIAGNOSIS — M542 Cervicalgia: Secondary | ICD-10-CM

## 2016-11-12 DIAGNOSIS — M5090 Cervical disc disorder, unspecified, unspecified cervical region: Secondary | ICD-10-CM

## 2016-11-12 DIAGNOSIS — M79602 Pain in left arm: Secondary | ICD-10-CM | POA: Diagnosis not present

## 2016-11-12 DIAGNOSIS — M5030 Other cervical disc degeneration, unspecified cervical region: Secondary | ICD-10-CM | POA: Diagnosis not present

## 2016-11-12 DIAGNOSIS — M5412 Radiculopathy, cervical region: Secondary | ICD-10-CM

## 2016-11-12 DIAGNOSIS — M549 Dorsalgia, unspecified: Secondary | ICD-10-CM

## 2016-11-12 DIAGNOSIS — M5010 Cervical disc disorder with radiculopathy, unspecified cervical region: Secondary | ICD-10-CM | POA: Diagnosis not present

## 2016-11-12 MED ORDER — MIDAZOLAM HCL 5 MG/5ML IJ SOLN
1.0000 mg | INTRAMUSCULAR | Status: DC | PRN
  Administered 2016-11-12: 4 mg via INTRAVENOUS

## 2016-11-12 MED ORDER — SODIUM CHLORIDE 0.9 % IJ SOLN
INTRAMUSCULAR | Status: AC
  Filled 2016-11-12: qty 10

## 2016-11-12 MED ORDER — LIDOCAINE HCL 2 % IJ SOLN
10.0000 mL | Freq: Once | INTRAMUSCULAR | Status: AC
  Administered 2016-11-12: 400 mg

## 2016-11-12 MED ORDER — MIDAZOLAM HCL 5 MG/5ML IJ SOLN
INTRAMUSCULAR | Status: AC
  Filled 2016-11-12: qty 5

## 2016-11-12 MED ORDER — IOPAMIDOL (ISOVUE-M 200) INJECTION 41%
INTRAMUSCULAR | Status: AC
  Filled 2016-11-12: qty 10

## 2016-11-12 MED ORDER — FENTANYL CITRATE (PF) 100 MCG/2ML IJ SOLN
INTRAMUSCULAR | Status: AC
  Filled 2016-11-12: qty 2

## 2016-11-12 MED ORDER — DEXAMETHASONE SODIUM PHOSPHATE 10 MG/ML IJ SOLN
10.0000 mg | Freq: Once | INTRAMUSCULAR | Status: AC
  Administered 2016-11-12: 10 mg

## 2016-11-12 MED ORDER — LACTATED RINGERS IV SOLN: 1000.0000 mL | Freq: Once | INTRAVENOUS | Status: DC

## 2016-11-12 MED ORDER — SODIUM CHLORIDE 0.9% FLUSH
1.0000 mL | Freq: Once | INTRAVENOUS | Status: AC
  Administered 2016-11-12: 10 mL

## 2016-11-12 MED ORDER — ROPIVACAINE HCL 2 MG/ML IJ SOLN
INTRAMUSCULAR | Status: AC
  Filled 2016-11-12: qty 10

## 2016-11-12 MED ORDER — FENTANYL CITRATE (PF) 100 MCG/2ML IJ SOLN
25.0000 ug | INTRAMUSCULAR | Status: DC | PRN
  Administered 2016-11-12: 100 ug via INTRAVENOUS

## 2016-11-12 MED ORDER — IOPAMIDOL (ISOVUE-M 200) INJECTION 41%
10.0000 mL | Freq: Once | INTRAMUSCULAR | Status: AC
  Administered 2016-11-12: 10 mL via EPIDURAL

## 2016-11-12 MED ORDER — DEXAMETHASONE SODIUM PHOSPHATE 10 MG/ML IJ SOLN
INTRAMUSCULAR | Status: AC
  Filled 2016-11-12: qty 1

## 2016-11-12 MED ORDER — GLYCOPYRROLATE 0.2 MG/ML IJ SOLN
0.2000 mg | Freq: Once | INTRAMUSCULAR | Status: AC
Start: 2016-11-12 — End: 2016-11-12
  Administered 2016-11-12: 0.2 mg via INTRAVENOUS
  Filled 2016-11-12: qty 1

## 2016-11-12 MED ORDER — ROPIVACAINE HCL 2 MG/ML IJ SOLN
1.0000 mL | Freq: Once | INTRAMUSCULAR | Status: AC
  Administered 2016-11-12: 10 mL via EPIDURAL

## 2016-11-12 NOTE — Progress Notes (Signed)
Safety precautions to be maintained throughout the outpatient stay will include: orient to surroundings, keep bed in low position, maintain call bell within reach at all times, provide assistance with transfer out of bed and ambulation.   Currently taking Bactrim for possible infection on abdomen. Has been on Bactrim x 2 weeks, no fever, symptoms are better.

## 2016-11-12 NOTE — Patient Instructions (Addendum)
____________________________________________________________________________________________  Pain Scale  Introduction: The pain score used by this practice is the Verbal Numerical Rating Scale (VNRS-11). This is an 11-point scale. It is for adults and children 10 years or older. There are significant differences in how the pain score is reported, used, and applied. Forget everything you learned in the past and learn this scoring system.  General Information: The scale should reflect your current level of pain. Unless you are specifically asked for the level of your worst pain, or your average pain. If you are asked for one of these two, then it should be understood that it is over the past 24 hours.  Basic Activities of Daily Living (ADL): Personal hygiene, dressing, eating, transferring, and using restroom.  Instructions: Most patients tend to report their level of pain as a combination of two factors, their physical pain and their psychosocial pain. This last one is also known as "suffering" and it is reflection of how physical pain affects you socially and psychologically. From now on, report them separately. From this point on, when asked to report your pain level, report only your physical pain. Use the following table for reference.  Pain Clinic Pain Levels (0-5/10)  Pain Level Score  Description  No Pain 0   Mild pain 1 Nagging, annoying, but does not interfere with basic activities of daily living (ADL). Patients are able to eat, bathe, get dressed, toileting (being able to get on and off the toilet and perform personal hygiene functions), transfer (move in and out of bed or a chair without assistance), and maintain continence (able to control bladder and bowel functions). Blood pressure and heart rate are unaffected. A normal heart rate for a healthy adult ranges from 60 to 100 bpm (beats per minute).   Mild to moderate pain 2 Noticeable and distracting. Impossible to hide from other  people. More frequent flare-ups. Still possible to adapt and function close to normal. It can be very annoying and may have occasional stronger flare-ups. With discipline, patients may get used to it and adapt.   Moderate pain 3 Interferes significantly with activities of daily living (ADL). It becomes difficult to feed, bathe, get dressed, get on and off the toilet or to perform personal hygiene functions. Difficult to get in and out of bed or a chair without assistance. Very distracting. With effort, it can be ignored when deeply involved in activities.   Moderately severe pain 4 Impossible to ignore for more than a few minutes. With effort, patients may still be able to manage work or participate in some social activities. Very difficult to concentrate. Signs of autonomic nervous system discharge are evident: dilated pupils (mydriasis); mild sweating (diaphoresis); sleep interference. Heart rate becomes elevated (>115 bpm). Diastolic blood pressure (lower number) rises above 100 mmHg. Patients find relief in laying down and not moving.   Severe pain 5 Intense and extremely unpleasant. Associated with frowning face and frequent crying. Pain overwhelms the senses.  Ability to do any activity or maintain social relationships becomes significantly limited. Conversation becomes difficult. Pacing back and forth is common, as getting into a comfortable position is nearly impossible. Pain wakes you up from deep sleep. Physical signs will be obvious: pupillary dilation; increased sweating; goosebumps; brisk reflexes; cold, clammy hands and feet; nausea, vomiting or dry heaves; loss of appetite; significant sleep disturbance with inability to fall asleep or to remain asleep. When persistent, significant weight loss is observed due to the complete loss of appetite and sleep deprivation.  Blood   pressure and heart rate becomes significantly elevated. Caution: If elevated blood pressure triggers a pounding headache  associated with blurred vision, then the patient should immediately seek attention at an urgent or emergency care unit, as these may be signs of an impending stroke.    Emergency Department Pain Levels (6-10/10)  Emergency Room Pain 6 Severely limiting. Requires emergency care and should not be seen or managed at an outpatient pain management facility. Communication becomes difficult and requires great effort. Assistance to reach the emergency department may be required. Facial flushing and profuse sweating along with potentially dangerous increases in heart rate and blood pressure will be evident.   Distressing pain 7 Self-care is very difficult. Assistance is required to transport, or use restroom. Assistance to reach the emergency department will be required. Tasks requiring coordination, such as bathing and getting dressed become very difficult.   Disabling pain 8 Self-care is no longer possible. At this level, pain is disabling. The individual is unable to do even the most "basic" activities such as walking, eating, bathing, dressing, transferring to a bed, or toileting. Fine motor skills are lost. It is difficult to think clearly.   Incapacitating pain 9 Pain becomes incapacitating. Thought processing is no longer possible. Difficult to remember your own name. Control of movement and coordination are lost.   The worst pain imaginable 10 At this level, most patients pass out from pain. When this level is reached, collapse of the autonomic nervous system occurs, leading to a sudden drop in blood pressure and heart rate. This in turn results in a temporary and dramatic drop in blood flow to the brain, leading to a loss of consciousness. Fainting is one of the body's self defense mechanisms. Passing out puts the brain in a calmed state and causes it to shut down for a while, in order to begin the healing process.    Summary: 1. Refer to this scale when providing [place] with your pain level. 2. Be  accurate and careful when reporting your pain level. This will help with your care. 3. Over-reporting your pain level will lead to loss of credibility. 4. Even a level of 1/10 means that there is pain and will be treated at our facility. 5. High, inaccurate reporting will be documented as "Symptom Exaggeration", leading to loss of credibility and suspicions of possible secondary gains such as obtaining more narcotics, or wanting to appear disabled, for fraudulent reasons. 6. Only pain levels of 5 or below will be seen at our facility. 7. Pain levels of 6 and above will be sent to the Emergency Department and the appointment cancelled. ____________________________________________________________________________________________   ____________________________________________________________________________________________  Post-Procedure instructions Instructions:  Apply ice: Fill a plastic sandwich bag with crushed ice. Cover it with a small towel and apply to injection site. Apply for 15 minutes then remove x 15 minutes. Repeat sequence on day of procedure, until you go to bed. The purpose is to minimize swelling and discomfort after procedure.  Apply heat: Apply heat to procedure site starting the day following the procedure. The purpose is to treat any soreness and discomfort from the procedure.  Food intake: Start with clear liquids (like water) and advance to regular food, as tolerated.   Physical activities: Keep activities to a minimum for the first 8 hours after the procedure.   Driving: If you have received any sedation, you are not allowed to drive for 24 hours after your procedure.  Blood thinner: Restart your blood thinner 6 hours after your procedure. (  Only for those taking blood thinners)  Insulin: As soon as you can eat, you may resume your normal dosing schedule. (Only for those taking insulin)  Infection prevention: Keep procedure site clean and dry.  Post-procedure Pain  Diary: Extremely important that this be done correctly and accurately. Recorded information will be used to determine the next step in treatment.  Pain evaluated is that of treated area only. Do not include pain from an untreated area.  Complete every hour, on the hour, for the initial 8 hours. Set an alarm to help you do this part accurately.  Do not go to sleep and have it completed later. It will not be accurate.  Follow-up appointment: Keep your follow-up appointment after the procedure. Usually 2 weeks for most procedures. (6 weeks in the case of radiofrequency.) Bring you pain diary.  Expect:  From numbing medicine (AKA: Local Anesthetics): Numbness or decrease in pain.  Onset: Full effect within 15 minutes of injected.  Duration: It will depend on the type of local anesthetic used. On the average, 1 to 8 hours.   From steroids: Decrease in swelling or inflammation. Once inflammation is improved, relief of the pain will follow.  Onset of benefits: Depends on the amount of swelling present. The more swelling, the longer it will take for the benefits to be seen. In some cases, up to 10 days.  Duration: Steroids will stay in the system x 2 weeks. Duration of benefits will depend on multiple posibilities including persistent irritating factors.  From procedure: Some discomfort is to be expected once the numbing medicine wears off. This should be minimal if ice and heat are applied as instructed. Call if:  You experience numbness and weakness that gets worse with time, as opposed to wearing off.  New onset bowel or bladder incontinence. (Spinal procedures only)  Emergency Numbers:  Durning business hours (Monday - Thursday, 8:00 AM - 4:00 PM) (Friday, 9:00 AM - 12:00 Noon): [phone]  After hours: [phone] ____________________________________________________________________________________________   Pain Management Discharge Instructions  General Discharge  Instructions :  If you need to reach your doctor call: Monday-Friday 8:00 am - 4:00 pm at [phone] or toll free [phone].  After clinic hours [phone] to have operator reach doctor.  Bring all of your medication bottles to all your appointments in the pain clinic.  To cancel or reschedule your appointment with Pain Management please remember to call 24 hours in advance to avoid a fee.  Refer to the educational materials which you have been given on: General Risks, I had my Procedure. Discharge Instructions, Post Sedation.  Post Procedure Instructions:  The drugs you were given will stay in your system until tomorrow, so for the next 24 hours you should not drive, make any legal decisions or drink any alcoholic beverages.  You may eat anything you prefer, but it is better to start with liquids then soups and crackers, and gradually work up to solid foods.  Please notify your doctor immediately if you have any unusual bleeding, trouble breathing or pain that is not related to your normal pain.  Depending on the type of procedure that was done, some parts of your body may feel week and/or numb.  This usually clears up by tonight or the next day.  Walk with the use of an assistive device or accompanied by an adult for the 24 hours.  You may use ice on the affected area for the first 24 hours.  Put ice in a Ziploc  bag and cover with a towel and place against area 15 minutes on 15 minutes off.  You may switch to heat after 24 hours. Epidural Steroid Injection An epidural steroid injection is a shot of steroid medicine and numbing medicine that is given into the space between the spinal cord and the bones in your back (epidural space). The shot helps relieve pain caused by an irritated or swollen nerve root. The amount of pain relief you get from the injection depends on what is causing the nerve to be swollen and irritated, and how long your pain lasts. You are more likely to benefit  from this injection if your pain is strong and comes on suddenly rather than if you have had pain for a long time. Tell a health care provider about:  Any allergies you have.  All medicines you are taking, including vitamins, herbs, eye drops, creams, and over-the-counter medicines.  Any problems you or family members have had with anesthetic medicines.  Any blood disorders you have.  Any surgeries you have had.  Any medical conditions you have.  Whether you are pregnant or may be pregnant. What are the risks? Generally, this is a safe procedure. However, problems may occur, including:  Headache.  Bleeding.  Infection.  Allergic reaction to medicines.  Damage to your nerves.  What happens before the procedure? Staying hydrated Follow instructions from your health care provider about hydration, which may include:  Up to 2 hours before the procedure - you may continue to drink clear liquids, such as water, clear fruit juice, black coffee, and plain tea.  Eating and drinking restrictions Follow instructions from your health care provider about eating and drinking, which may include:  8 hours before the procedure - stop eating heavy meals or foods such as meat, fried foods, or fatty foods.  6 hours before the procedure - stop eating light meals or foods, such as toast or cereal.  6 hours before the procedure - stop drinking milk or drinks that contain milk.  2 hours before the procedure - stop drinking clear liquids.  Medicine  You may be given medicines to lower anxiety.  Ask your health care provider about: ? Changing or stopping your regular medicines. This is especially important if you are taking diabetes medicines or blood thinners. ? Taking medicines such as aspirin and ibuprofen. These medicines can thin your blood. Do not take these medicines before your procedure if your health care provider instructs you not to. General instructions  Plan to have someone  take you home from the hospital or clinic. What happens during the procedure?  You may receive a medicine to help you relax (sedative).  You will be asked to lie on your abdomen.  The injection site will be cleaned.  A numbing medicine (local anesthetic) will be used to numb the injection site.  A needle will be inserted through your skin into the epidural space. You may feel some discomfort when this happens. An X-ray machine will be used to make sure the needle is put as close as possible to the affected nerve.  A steroid medicine and a local anesthetic will be injected into the epidural space.  The needle will be removed.  A bandage (dressing) will be put over the injection site. What happens after the procedure?  Your blood pressure, heart rate, breathing rate, and blood oxygen level will be monitored until the medicines you were given have worn off.  Your arm or leg may feel weak  or numb for a few hours.  The injection site may feel sore.  Do not drive for 24 hours if you received a sedative. This information is not intended to replace advice given to you by your health care provider. Make sure you discuss any questions you have with your health care provider. Document Released: 04/02/2007 Document Revised: 06/07/2015 Document Reviewed: 04/11/2015 [employer] Interactive Patient Education  2017 [employer].

## 2016-11-12 NOTE — Progress Notes (Signed)
Patient's Name: Emily [name]  MRN: [id number]  Referring Provider: Milinda Pointer, MD  DOB: [birth year]  PCP: Patient, No Pcp Per  DOS: 11/12/2016  Note by: Gaspar Cola, MD  Service setting: Ambulatory outpatient  Specialty: Interventional Pain Management  Patient type: Established  Location: [facility] (AMB) Pain Management Facility  Visit type: Interventional Procedure   Primary Reason for Visit: Interventional Pain Management Treatment. CC: Back Pain (upper)  Procedure:  Anesthesia, Analgesia, Anxiolysis:  Type: Diagnostic, Inter-Laminar, Epidural Steroid Injection Region: Posterior Cervico-thoracic Region Level: C7-T1 Laterality: Right-Sided Paramedial  Type: Local Anesthesia with Moderate (Conscious) Sedation Local Anesthetic: Lidocaine 1% Route: Intravenous (IV) IV Access: Secured Sedation: Meaningful verbal contact was maintained at all times during the procedure  Indication(s): Analgesia and Anxiety   Indications: 1. Cervical radiculitis (Bilateral)   2. Chronic upper extremity pain (Secondary Area of Pain) (Bilateral) (L>R)   3. DDD (degenerative disc disease), cervical   4. 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 Chronic upper back pain (Primary Area of Pain) (Bilateral) (R>L)   6. Chronic neck pain   7. Cervical disc disease    Pain Score: Pre-procedure: 7 /10 Post-procedure: 0-No pain/10  Pre-op Assessment:  Emily [name] is a 36 y.o. (year old), female patient, seen today for interventional treatment. 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 Emily [name] has a current medication list which includes the following prescription(s): albuterol,  amlodipine, buspirone, calcium plus d3 absorbable, cholecalciferol, diazepam, diphenhydramine, divalproex, epinephrine, ergocalciferol, etonogestrel, fingolimod hcl, fluoxetine, ibuprofen, methocarbamol, metoclopramide, multiple vitamins-minerals, onabotulinumtoxina, ranitidine, sulfamethoxazole-trimethoprim, sumatriptan, and topiramate, and the following Facility-Administered Medications: fentanyl, lactated ringers, metoclopramide, and midazolam. Her primarily concern today is the Back Pain (upper)  Initial Vital Signs: Blood pressure 118/67, pulse 89, temperature 98.7 F (37.1 C), temperature source Oral, resp. rate 18, height 5\' 4"  (1.626 m), weight 250 lb (113.4 kg), SpO2 100 %, not currently breastfeeding. BMI: Estimated body mass index is 42.91 kg/m as calculated from the following:   Height as of this encounter: 5\' 4"  (1.626 m).   Weight as of this encounter: 250 lb (113.4 kg).  Risk Assessment: Allergies: Reviewed. She is allergic to amitriptyline; baclofen; cymbalta [duloxetine hcl]; gabapentin; monosodium glutamate; other; vicodin [hydrocodone-acetaminophen]; magnesium salicylate; rizatriptan; tizanidine; tramadol; adhesive [tape]; lamotrigine; and toradol [ketorolac tromethamine].  Allergy Precautions: None required Coagulopathies: Reviewed. None identified.  Blood-thinner therapy: None at this time Active Infection(s): Reviewed. None identified. Emily [name] is afebrile  Site Confirmation: Emily [name] was asked to confirm the procedure and laterality before marking the site Procedure checklist: Completed Consent: Before the procedure and under the influence of no sedative(s), amnesic(s), or anxiolytics, the patient was informed of the treatment options, risks and possible complications. To fulfill our ethical and legal obligations, as recommended by the American Medical Association's Code of Ethics, I have informed the patient of my clinical impression; the nature and purpose of the  treatment or procedure; the risks, benefits, and possible complications of the intervention; the alternatives, including doing nothing; the risk(s) and benefit(s) of the alternative treatment(s) or procedure(s); and the risk(s) and benefit(s) of doing nothing. The patient was provided information about the general risks and possible complications associated with the procedure. These may include, but are not limited to: failure to achieve desired  goals, infection, bleeding, organ or nerve damage, allergic reactions, paralysis, and death. In addition, the patient was informed of those risks and complications associated to Spine-related procedures, such as failure to decrease pain; infection (i.e.: Meningitis, epidural or intraspinal abscess); bleeding (i.e.: epidural hematoma, subarachnoid hemorrhage, or any other type of intraspinal or peri-dural bleeding); organ or nerve damage (i.e.: Any type of peripheral nerve, nerve root, or spinal cord injury) with subsequent damage to sensory, motor, and/or autonomic systems, resulting in permanent pain, numbness, and/or weakness of one or several areas of the body; allergic reactions; (i.e.: anaphylactic reaction); and/or death. Furthermore, the patient was informed of those risks and complications associated with the medications. These include, but are not limited to: allergic reactions (i.e.: anaphylactic or anaphylactoid reaction(s)); adrenal axis suppression; blood sugar elevation that in diabetics may result in ketoacidosis or comma; water retention that in patients with history of congestive heart failure may result in shortness of breath, pulmonary edema, and decompensation with resultant heart failure; weight gain; swelling or edema; medication-induced neural toxicity; particulate matter embolism and blood vessel occlusion with resultant organ, and/or nervous system infarction; and/or aseptic necrosis of one or more joints. Finally, the patient was informed that  Medicine is not an exact science; therefore, there is also the possibility of unforeseen or unpredictable risks and/or possible complications that may result in a catastrophic outcome. The patient indicated having understood very clearly. We have given the patient no guarantees and we have made no promises. Enough time was given to the patient to ask questions, all of which were answered to the patient's satisfaction. Emily [name] has indicated that she wanted to continue with the procedure. Attestation: I, the ordering provider, attest that I have discussed with the patient the benefits, risks, side-effects, alternatives, likelihood of achieving goals, and potential problems during recovery for the procedure that I have provided informed consent. Date: 11/12/2016; Time: 1:29 PM  Pre-Procedure Preparation:  Monitoring: As per clinic protocol. Respiration, ETCO2, SpO2, BP, heart rate and rhythm monitor placed and checked for adequate function Safety Precautions: Patient was assessed for positional comfort and pressure points before starting the procedure. Time-out: I initiated and conducted the "Time-out" before starting the procedure, as per protocol. The patient was asked to participate by confirming the accuracy of the "Time Out" information. Verification of the correct person, site, and procedure were performed and confirmed by me, the nursing staff, and the patient. "Time-out" conducted as per Joint Commission's Universal Protocol (UP.01.01.01). "Time-out" Date & Time: 11/12/2016; 1403 hrs.  Description of Procedure Process:   Position: Prone with head of the table was raised to facilitate breathing. Target Area: For Epidural Steroid injections the target is the interlaminar space, initially targeting the lower border of the superior vertebral body lamina. Approach: Paramedial approach. Area Prepped: Entire PosteriorCervical Region Prepping solution: ChloraPrep (2% chlorhexidine gluconate and 70%  isopropyl alcohol) Safety Precautions: Aspiration looking for blood return was conducted prior to all injections. At no point did we inject any substances, as a needle was being advanced. No attempts were made at seeking any paresthesias. Safe injection practices and needle disposal techniques used. Medications properly checked for expiration dates. SDV (single dose vial) medications used. Description of the Procedure: Protocol guidelines were followed. The procedure needle was introduced through the skin, ipsilateral to the reported pain, and advanced to the target area. Bone was contacted and the needle walked caudad, until the lamina was cleared. The epidural space was identified using "loss-of-resistance technique" with 2-3 ml of PF-NaCl (0.9%  NSS), in a 5cc LOR glass syringe. Vitals:   11/12/16 1414 11/12/16 1424 11/12/16 1434 11/12/16 1444  BP: 126/87 (!) 82/37 (!) 139/93 (!) 145/94  Pulse:      Resp: 15 18 18 18   Temp: (!) 97.4 F (36.3 C)     TempSrc:      SpO2: 98% 94% 99% 98%  Weight:      Height:        Start Time: 1403 hrs. End Time: 1413 hrs. Materials:  Needle(s) Type: Epidural needle Gauge: 17G Length: 3.5-in Medication(s): We administered midazolam, fentaNYL, iopamidol, dexamethasone, ropivacaine (PF) 2 mg/mL (0.2%), sodium chloride flush, lidocaine, and glycopyrrolate. Please see chart orders for dosing details.  Imaging Guidance (Spinal):  Type of Imaging Technique: Fluoroscopy Guidance (Spinal) Indication(s): Assistance in needle guidance and placement for procedures requiring needle placement in or near specific anatomical locations not easily accessible without such assistance. Exposure Time: Please see nurses notes. Contrast: Before injecting any contrast, we confirmed that the patient did not have an allergy to iodine, shellfish, or radiological contrast. Once satisfactory needle placement was completed at the desired level, radiological contrast was injected.  Contrast injected under live fluoroscopy. No contrast complications. See chart for type and volume of contrast used. Fluoroscopic Guidance: I was personally present during the use of fluoroscopy. "Tunnel Vision Technique" used to obtain the best possible view of the target area. Parallax error corrected before commencing the procedure. "Direction-depth-direction" technique used to introduce the needle under continuous pulsed fluoroscopy. Once target was reached, antero-posterior, oblique, and lateral fluoroscopic projection used confirm needle placement in all planes. Images permanently stored in EMR. Interpretation: I personally interpreted the imaging intraoperatively. Adequate needle placement confirmed in multiple planes. Appropriate spread of contrast into desired area was observed. No evidence of afferent or efferent intravascular uptake. No intrathecal or subarachnoid spread observed. Permanent images saved into the patient's record.  Antibiotic Prophylaxis:  Indication(s): None identified Antibiotic given: None  Post-operative Assessment:  EBL: None Complications: No immediate post-treatment complications observed by team, or reported by patient. Note: The patient tolerated the entire procedure well. A repeat set of vitals were taken after the procedure and the patient was kept under observation following institutional policy, for this type of procedure. Post-procedural neurological assessment was performed, showing return to baseline, prior to discharge. The patient was provided with post-procedure discharge instructions, including a section on how to identify potential problems. Should any problems arise concerning this procedure, the patient was given instructions to immediately contact us, at any time, without hesitation. In any case, we plan to contact the patient by telephone for a follow-up status report regarding this interventional procedure. Comments:  No additional relevant  information.  Plan of Care    Imaging Orders     DG C-Arm 1-60 Min-No Report Procedure Orders    No procedure(s) ordered today    Medications ordered for procedure: Meds ordered this encounter  Medications  . lactated ringers infusion 1,000 mL  . midazolam (VERSED) 5 MG/5ML injection 1-2 mg    Make sure Flumazenil is available in the pyxis when using this medication. If oversedation occurs, administer 0.2 mg IV over 15 sec. If after 45 sec no response, administer 0.2 mg again over 1 min; may repeat at 1 min intervals; not to exceed 4 doses (1 mg)  . fentaNYL (SUBLIMAZE) injection 25-50 mcg    Make sure Narcan is available in the pyxis when using this medication. In the event of respiratory depression (RR< 8/min): Titrate NARCAN (  naloxone) in increments of 0.1 to 0.2 mg IV at 2-3 minute intervals, until desired degree of reversal.  . iopamidol (ISOVUE-M) 41 % intrathecal injection 10 mL  . dexamethasone (DECADRON) injection 10 mg  . ropivacaine (PF) 2 mg/mL (0.2%) (NAROPIN) injection 1 mL  . sodium chloride flush (NS) 0.9 % injection 1 mL  . lidocaine (XYLOCAINE) 2 % (with pres) injection 200 mg  . glycopyrrolate (ROBINUL) injection 0.2 mg   Medications administered: We administered midazolam, fentaNYL, iopamidol, dexamethasone, ropivacaine (PF) 2 mg/mL (0.2%), sodium chloride flush, lidocaine, and glycopyrrolate.  See the medical record for exact dosing, route, and time of administration.  This SmartLink is deprecated. Use AVSMEDLIST instead to display the medication list for a patient. Disposition: Discharge home  Discharge Date & Time: 11/12/2016; 1447 hrs.   Physician-requested Follow-up: Return for post-procedure eval by Dr. Dossie Arbour in 2 wks. Future Appointments  Date Time Provider [place]  11/27/2016  1:00 PM Cameron Sprang, MD [facility] None  12/02/2016  1:45 PM Milinda Pointer, MD [facility] None  12/27/2016 10:30 AM Pieter Partridge, DO [facility] None  02/13/2017   1:00 PM Charlcie Cradle, MD [facility] None   Primary Care Physician: Patient, No Pcp Per Location: [facility] Note by: Gaspar Cola, MD Date: 11/12/2016; Time: 3:40 PM  Disclaimer:  Medicine is not an Chief Strategy Officer. The only guarantee in medicine is that nothing is guaranteed. It is important to note that the decision to proceed with this intervention was based on the information collected from the patient. The Data and conclusions were drawn from the patient's questionnaire, the interview, and the physical examination. Because the information was provided in large part by the patient, it cannot be guaranteed that it has not been purposely or unconsciously manipulated. Every effort has been made to obtain as much relevant data as possible for this evaluation. It is important to note that the conclusions that lead to this procedure are derived in large part from the available data. Always take into account that the treatment will also be dependent on availability of resources and existing treatment guidelines, considered by other Pain Management Practitioners as being common knowledge and practice, at the time of the intervention. For Medico-Legal purposes, it is also important to point out that variation in procedural techniques and pharmacological choices are the acceptable norm. The indications, contraindications, technique, and results of the above procedure should only be interpreted and judged by a Board-Certified Interventional Pain Specialist with extensive familiarity and expertise in the same exact procedure and technique.

## 2016-11-13 ENCOUNTER — Telehealth: Payer: Self-pay

## 2016-11-13 NOTE — Telephone Encounter (Signed)
Denies any needs at this time. Instructed to call if needed. 

## 2016-11-14 NOTE — Telephone Encounter (Signed)
No change in meds for now

## 2016-11-18 ENCOUNTER — Telehealth: Payer: Self-pay | Admitting: Pain Medicine

## 2016-11-18 NOTE — Telephone Encounter (Signed)
Patient Emily [name] on Friday 11-15-17 at 1:54 saying she spoke with Mickel Fuchs about setting up PT. Mickel Fuchs from [place]. Therapy suggested she get set up with someone closer to her home. Please call to discuss

## 2016-11-26 ENCOUNTER — Telehealth: Payer: Self-pay | Admitting: Neurology

## 2016-11-26 NOTE — Telephone Encounter (Signed)
Patient cant make her botox appt in Dec so she would like to resch. Please advise when

## 2016-11-26 NOTE — Telephone Encounter (Signed)
Our next Botox day is Feb 22

## 2016-11-27 ENCOUNTER — Encounter: Payer: Self-pay | Admitting: Neurology

## 2016-11-27 ENCOUNTER — Ambulatory Visit (INDEPENDENT_AMBULATORY_CARE_PROVIDER_SITE_OTHER): Payer: Medicare Other | Admitting: Neurology

## 2016-11-27 VITALS — BP 124/78 | HR 73 | Ht 64.0 in | Wt 248.0 lb

## 2016-11-27 DIAGNOSIS — F445 Conversion disorder with seizures or convulsions: Secondary | ICD-10-CM

## 2016-11-27 DIAGNOSIS — G43709 Chronic migraine without aura, not intractable, without status migrainosus: Secondary | ICD-10-CM

## 2016-11-27 DIAGNOSIS — F411 Generalized anxiety disorder: Secondary | ICD-10-CM

## 2016-11-27 DIAGNOSIS — G35 Multiple sclerosis: Secondary | ICD-10-CM | POA: Diagnosis not present

## 2016-11-27 DIAGNOSIS — G43109 Migraine with aura, not intractable, without status migrainosus: Secondary | ICD-10-CM

## 2016-11-27 DIAGNOSIS — G894 Chronic pain syndrome: Secondary | ICD-10-CM | POA: Diagnosis not present

## 2016-11-27 MED ORDER — DIPHENHYDRAMINE HCL 50 MG/ML IJ SOLN
25.0000 mg | Freq: Once | INTRAMUSCULAR | Status: AC
  Administered 2016-11-27: 25 mg via INTRAMUSCULAR

## 2016-11-27 MED ORDER — DEXTROSE 5 % IV SOLN: 10.0000 mg | Freq: Once | INTRAVENOUS | Status: DC

## 2016-11-27 MED ORDER — KETOROLAC TROMETHAMINE 60 MG/2ML IM SOLN
60.0000 mg | Freq: Once | INTRAMUSCULAR | Status: AC
  Administered 2016-11-27: 60 mg via INTRAMUSCULAR

## 2016-11-27 MED ORDER — DIVALPROEX SODIUM 500 MG PO DR TAB: 500.0000 mg | DELAYED_RELEASE_TABLET | Freq: Every day | ORAL | 3 refills | Status: DC

## 2016-11-27 MED ORDER — SUMATRIPTAN SUCCINATE 100 MG PO TABS: 100.0000 mg | ORAL_TABLET | ORAL | 6 refills | Status: DC | PRN

## 2016-11-27 MED ORDER — TOPIRAMATE 50 MG PO TABS: ORAL_TABLET | ORAL | 3 refills | Status: DC

## 2016-11-27 NOTE — Telephone Encounter (Signed)
Check for an appointment in early January.Marland Kitchen7:30 if absolutely necessary, I will come in earlier that day if that's all we have available, please let me know-thank you!

## 2016-11-27 NOTE — Progress Notes (Signed)
NEUROLOGY FOLLOW UP OFFICE NOTE  Emily [name] [id number]  HISTORY OF PRESENT ILLNESS: I had the pleasure of seeing Emily [name] in follow-up in the neurology clinic on 11/27/2016.  The patient was last seen 4 months ago for worsening of migraines. She has several neurological conditions. She has a history of MS and stopped Gilenya during pregnancy. It took a while to finally get her back on the medication, but she was finally restarted and continues to tolerate it well without side effects or evidence of MS flares. She is taking Topamax 50mg  in AM, 100mg  in PM and Depakote 500mg  qhs for migraine prophylaxis, she is satisfied with migraine frequency but has had a migraine daily for the past week, with associated dizziness. She has been getting Botox as well, with good effect. She continues to deal with a lot of social issues with her daughter's father, but overall reports doing pretty well. She continues to have panic attacks but is down to taking Xanax only at night for night terrors. She has not had any PNES shaking events recently. She continues to follow with Pain Management. She is wearing bilateral wrist splints for carpal tunnel syndrome.  HPI 03/28/15: This is a pleasant 36 yo RH woman with a history of relapsing-remitting multiple sclerosis, headaches, anxiety with panic attacks, conversion disorder with seizures. She presented at [redacted] weeks pregnant to determine if diazepam for anxiety/seizures would be a medication she can use, understanding this is pregnancy category D versus risks of injuring herself from falls when she has the psychogenic seizures. Records from her previous neurologists at [facility] were reviewed. She was diagnosed with MS in June 2008 while living in [place] state, she started having right thigh pain and right eye blurred vision. She was diagnosed with right optic neuritis and underwent MRI brain and lumbar puncture which confirmed a diagnosis of MS. 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 She has been on Gilenya since 2014 without any relapse. This was discontinued in December 2016 when she found out she was pregnant. She has a history of chronic pain and migraines, and was taking Depakote and Topamax for headache prophylaxis, also stopped in December 2016. She reports having Botox for her migraines, but so far migraines are not as bad as they were.  She started having shaking episodes in April 2016. Initially Depakote dose was increased to 500mg  BID. She was back in May 2016 with multiple episodes of syncope, palpitations, a sensation of her heart jumping out of her chest. She also reported lightheadedness, facial numbness, and numbness in her hands and feet. On her last visit in June 2016 at [facility], shaking episodes were discussed. Events always occur after a stressful event. She had a spell in [place] where she had a normal EEG and was diagnosed with a conversion disorder. She had seen Dr. Darleene Cleaver and reportedly told her he could not help her because "her case was too complex." She started seeing a psychiatrist in [place], however since she switched neurologists, she reports that he cannot see her anymore. She has not been able to establish care with [facility] and continues to have anxiety and panic attacks "all the time." She is diaphoretic in the office today and reports she always feels anxious. When she starts having one of her seizures, she would have significant anxiety, "my body can't deal and goes  into a seizure," she hears her heartbeat in her ears. If it slowly builds up, she can get herself to a safe place, however other times it "just hits me," and she falls to the ground and has injured herself. She reports falling out one time in the bathroom during a doctor visit. She is currently on Fluoxetine and  Buspirone. Clonazepam in the past did not help. She would usually go to the ER for these spells, which would quiet down once she gets Benadryl and Diazepam.  She delivered early at [redacted] weeks gestation last July 12, 2015. A week after delivery, she started having numbness on the top of both feet, pain in the balls of her feet, with numbness traveling up to her mid-back. She also has numbness in both forearms, and reports her palms feel the same as her soles, with constant stabbing burning pain. She had these symptoms pre-pregnancy but states the numbness only went up to her knees. During pregnancy, all her symptoms were less bothersome. She has been following with Pain management in [facility] and would occasionally take Oxycodone prior to her pregnancy.  Diagnostic Data:  I personally reviewed MRI brain with and without contrast done with [facility] last 10/02/13 which did not show any acute changes, there was multiple T2/FLAIR lesions in the subcortical and deep white matter regions, no abnormal enhancement. Hippocampi appear asymmetric, smaller on the right without abnormal signal or enhancement.  MRI C-spine with and without contrast 10/18/13 showed hazy T2 hyperintensity within the cord from C2-3 to C4-5 without abnormal enhancement.  per [facility] - MRI brain done 03/18/14: "No change in numerous multifocal T2 hyperintense white matter lesions relative to prior MRI from 04/17/2013 most consistent with demyelinating lesions given history of multiple sclerosis. No evidence of active demyelination."  MRI cervical spine done 03/18/14: "Relative to prior MRI from 10/20/2009, no change in patchy foci of increased T2 signal in the dorsal cord at C2- C4 in keeping with demyelinating lesions in this patient with a clinical history of multiple sclerosis.  No evidence of enhancement to suggest active demyelination. Moderately advanced degenerative disease at C5-C6 and C6-C7 with interval development of left eccentric  central, foraminal entry zone and foraminal disc extrusion at C6-C7 exerting mass effect on the exiting C7 and descending C8 roots.  MRI brain and C-spine 07/30/2015 with and without contrast : There was no abnormal enhancement. There were no changes from previous MRI brain in 09/2013 with multiple bilateral subcortical greater than 10 periventricular T2 hyperintensities. There were no cord lesions seen up to T3-4. There was chronic degenerative disc disease and central canal stenosis at C5-6 and C6-7, no abnormal cord signal, unchanged from 06/2014.  EMG/NCV done on both UE and right LE showed bilateral carpal tunnel syndrome, mild to moderate in degree, no evidence of polyneuropathy or radiculopathy on the right side.  PAST MEDICAL HISTORY: Past Medical History:  Diagnosis Date  . Anxiety   . Asthma as a child  . Chronic back pain   . Chronic pain   . Conversion disorder   . Depression   . GERD (gastroesophageal reflux disease)   . Headache(784.0)    migraines  . HTN (hypertension) 02/27/2015  . JC virus antibody positive   . Migraines   . MS (multiple sclerosis) ([place])    06-2006  . Multiple sclerosis ([place])   . Ovarian cyst   . Panic attack   . Perforated bowel ([place]) 2009  . Pseudoseizures   . PTSD (post-traumatic stress  disorder)   . S/P emergency C-section   . Urinary urgency     MEDICATIONS: Current Outpatient Medications on File Prior to Visit  Medication Sig Dispense Refill  . albuterol (PROVENTIL HFA;VENTOLIN HFA) 108 (90 Base) MCG/ACT inhaler Inhale 2 puffs into the lungs every 6 (six) hours as needed for wheezing or shortness of breath. 1 Inhaler 0  . amLODipine (NORVASC) 5 MG tablet Take 5 mg by mouth every evening.  2  . busPIRone (BUSPAR) 15 MG tablet Take 1 tablet (15 mg total) by mouth 3 (three) times daily. 90 tablet 3  . Calcium Carb-Cholecalciferol (CALCIUM PLUS D3 ABSORBABLE) [phone] MG-UNIT CAPS Take 1 capsule by mouth daily with breakfast. 90 capsule 0  .  Cholecalciferol 5000 units capsule Take 1 capsule (5,000 Units total) by mouth daily. 90 capsule 0  . diazepam (VALIUM) 5 MG tablet Take 1 tablet (5 mg total) by mouth daily. Take 1 tablet as needed for panic attacks leading to seizures. 30 tablet 3  . diphenhydrAMINE (SOMINEX) 25 MG tablet Take 2 tablets (50 mg total) by mouth at bedtime as needed for sleep. 60 tablet 3  . divalproex (DEPAKOTE) 500 MG DR tablet Take 1 tablet (500 mg total) by mouth at bedtime. 90 tablet 3  . EPINEPHrine (EPIPEN) 0.3 mg/0.3 mL SOAJ injection Inject 0.3 mg into the muscle as needed (allergic reaction). Reported on 03/28/2015    . ergocalciferol (VITAMIN D2) 50000 units capsule Take 1 capsule (50,000 Units total) by mouth 2 (two) times a week. X 6 weeks. 12 capsule 0  . etonogestrel (NEXPLANON) 68 MG IMPL implant 1 each by Subdermal route once.    . Fingolimod HCl (GILENYA) 0.5 MG CAPS Take by mouth daily.    Marland Kitchen FLUoxetine (PROZAC) 40 MG capsule Take 2 capsules (80 mg total) by mouth daily. 60 capsule 3  . ibuprofen (ADVIL,MOTRIN) 200 MG tablet Take 600 mg by mouth every 6 (six) hours as needed for headache or moderate pain.    . methocarbamol (ROBAXIN) 500 MG tablet Take 500 mg by mouth 2 (two) times daily as needed for muscle spasms.   2  . metoCLOPramide (REGLAN) 10 MG tablet TAKE 1 TABLET AS NEEDED 2-3 TIMES A WEEK. 10 tablet 0  . Multiple Vitamins-Minerals (MULTIVITAMIN ADULT PO) Take 1 tablet by mouth daily.     . OnabotulinumtoxinA (BOTOX IJ) Inject as directed every 3 (three) months.    . ranitidine (ZANTAC) 300 MG capsule Take 300 mg by mouth at bedtime. Reported on 07/21/2015    . sulfamethoxazole-trimethoprim (BACTRIM,SEPTRA) 400-80 MG tablet Take 1 tablet by mouth 2 (two) times daily.    . SUMAtriptan (IMITREX) 100 MG tablet Take 1 tablet (100 mg total) by mouth every 2 (two) hours as needed for migraine. May repeat in 2 hours if headache persists or recurs. 9 tablet 6  . topiramate (TOPAMAX) 50 MG tablet  Take 1 tablet in AM, 2 tablets in PM 270 tablet 3   Current Facility-Administered Medications on File Prior to Visit  Medication Dose Route Frequency Provider Last Rate Last Dose  . metoCLOPramide (REGLAN) injection 10 mg  10 mg Intravenous Once Milton Ferguson, MD        ALLERGIES: Allergies  Allergen Reactions  . Amitriptyline Hypertension and Other (See Comments)    hypertension  . Baclofen Hives and Shortness Of Breath  . Cymbalta [Duloxetine Hcl] Shortness Of Breath and Rash  . Gabapentin Shortness Of Breath and Rash  . Monosodium Glutamate Anaphylaxis  . Other  Shortness Of Breath and Rash    MSG, beans (vomiting)  . Vicodin [Hydrocodone-Acetaminophen] Hives and Nausea And Vomiting    Projectile vomiting  . Magnesium Salicylate Hives and Itching  . Rizatriptan Nausea And Vomiting and Other (See Comments)    GI upset, Projectile vomiting  . Tizanidine Hives  . Tramadol Other (See Comments)    Unspecified  . Adhesive [Tape] Other (See Comments)    Skin irritation Paper tape is ok  . Lamotrigine Rash  . Toradol [Ketorolac Tromethamine] Nausea Only    FAMILY HISTORY: Family History  Problem Relation Age of Onset  . Diabetes Mother   . Hypertension Mother   . Diabetes Father   . Hypertension Father   . Arthritis Father   . Cancer Maternal Grandmother   . Cancer Maternal Grandfather   . Alcohol abuse Neg Hx   . Anxiety disorder Neg Hx   . Bipolar disorder Neg Hx   . Drug abuse Neg Hx   . Depression Neg Hx     SOCIAL HISTORY: Social History   Socioeconomic History  . Marital status: Single    Spouse name: Not on file  . Number of children: 0  . Years of education: College  . Highest education level: Not on file  Social Needs  . Financial resource strain: Not on file  . Food insecurity - worry: Not on file  . Food insecurity - inability: Not on file  . Transportation needs - medical: Not on file  . Transportation needs - non-medical: Not on file    Occupational History    Employer: OTHER    Comment: disability  Tobacco Use  . Smoking status: Former Smoker    Packs/day: 0.25    Years: 10.00    Pack years: 2.50    Types: Cigarettes    Last attempt to quit: 07/12/2015    Years since quitting: 1.3  . Smokeless tobacco: Never Used  Substance and Sexual Activity  . Alcohol use: No    Alcohol/week: 0.0 oz    Comment: Rare  . Drug use: No  . Sexual activity: Not Currently    Birth control/protection: None  Other Topics Concern  . Not on file  Social History Narrative   Patient lives at home with fiance in [place]. Born and raised in [place], [place] by parents. Pt has one younger sister. Pt has a Best boy. Pt worked from 1999-2006. She stopped due to MS and is currently on disability. Pt is pregnant with her 1st child. Married for less than 1 yr in 1999 that ended in divorce.             Caffeine Use: 1 20oz soda daily    REVIEW OF SYSTEMS: Constitutional: No fevers, chills, or sweats, + generalized fatigue, no change in appetite Eyes: as above, no double vision, eye pain Ear, nose and throat: No hearing loss, ear pain, nasal congestion, sore throat Cardiovascular: No chest pain, palpitations Respiratory:  No shortness of breath at rest or with exertion, wheezes GastrointestinaI: No nausea, vomiting, diarrhea, abdominal pain, fecal incontinence Genitourinary:  No dysuria, urinary retention, +urinary frequency Musculoskeletal:  + neck pain, back pain Integumentary: No rash, pruritus, skin lesions Neurological: as above Psychiatric: + depression, anxiety Endocrine: No palpitations, fatigue, diaphoresis, mood swings, change in appetite, change in weight, increased thirst Hematologic/Lymphatic:  No anemia, purpura, petechiae. Allergic/Immunologic: no itchy/runny eyes, nasal congestion, recent allergic reactions, rashes  PHYSICAL EXAM: Vitals:   11/27/16 1306  BP: 124/78  Pulse: 73  SpO2: 98%    General: No acute distress Head: Normocephalic/atraumatic Eyes: Fundoscopic exam shows bilateral sharp discs, no vessel changes, exudates, or hemorrhages Neck: supple, no paraspinal tenderness, full range of motion Back: No paraspinal tenderness Heart: regular rate and rhythm Lungs: Clear to auscultation bilaterally. Vascular: No carotid bruits. Skin/Extremities: No rash, no edema, wearing bilateral wrist brace Neurological Exam: Mental status: alert and oriented to person, place, and time, no dysarthria or aphasia, Fund of knowledge is appropriate. Recent and remote memory are intact. Attention and concentration are normal. Able to name objects and repeat phrases. Cranial nerves: CN I: not tested CN II: pupils equal, round and reactive to light,no APD, visual fields intact, fundi unremarkable. CN III, IV, VI: full range of motion, no nystagmus, no ptosis CN V: facial sensation intact CN VII: upper and lower face symmetric CN VIII: hearing intact to finger rub CN IX, X: gag intact, uvula midline CN XI: sternocleidomastoid and trapezius muscles intact CN XII: tongue midline Bulk & Tone: normal, no fasciculations. Motor: 5/5 throughout with no pronator drift. Sensation: intact to light touch Deep Tendon Reflexes: +2 throughout, no ankle clonus Plantar responses: downgoing bilaterally Cerebellar: no incoordination on finger to nose testing Gait: slow and cautious, no ataxia Tremor: none  IMPRESSION: This is a 36 yo RH woman with a history of relapsing-remitting multiple sclerosis, migraine with aura, anxiety with panic attacks, conversion disorder with psychogenic seizures. Her MS has been stable, no new symptoms, she continues on Gilenya with no MS flares, no side effects. She has chronic migraines and is on Botox treatment, as well as Topamax 50mg  in AM, 100mg  in PM and Depakote 500mg  qhs. She has had a migraine daily for the past week and will be given a migraine cocktail in  the office today. She continues to work with her psychiatrist for the anxiety. Continue bilateral wrist splints for carpal tunnel syndrome. She does not drive. She will follow-up in 6 months and knows to call for any changes.   Thank you for allowing me to participate in her care.  Please do not hesitate to call for any questions or concerns.  The duration of this appointment visit was 25 minutes of face-to-face time with the patient.  Greater than 50% of this time was spent in counseling, explanation of diagnosis, planning of further management, and coordination of care.   Ellouise Newer, M.D.   CC: [facility]

## 2016-11-27 NOTE — Telephone Encounter (Signed)
That would be over 3 months almost 5 months since last one I dont know about how that will effect ins do you

## 2016-11-27 NOTE — Patient Instructions (Addendum)
1. Migraine cocktail today 2. Continue with all your medications. Continue with Botox. 3. Follow-up in 6 months, call for any changes  Seizure Precautions: 1. If medication has been prescribed for you to prevent seizures, take it exactly as directed.  Do not stop taking the medicine without talking to your doctor first, even if you have not had a seizure in a long time.   2. Avoid activities in which a seizure would cause danger to yourself or to others.  Don't operate dangerous machinery, swim alone, or climb in high or dangerous places, such as on ladders, roofs, or girders.  Do not drive unless your doctor says you may.  3. If you have any warning that you may have a seizure, lay down in a safe place where you can't hurt yourself.    4.  No driving for 6 months from last seizure, as per [facility].   Please refer to the following link on the [place] website for more information: http://[email or web address]/[email or web address]   5.  Maintain good sleep hygiene. Avoid alcohol.  6.  Notify your neurology if you are planning pregnancy or if you become pregnant.  7.  Contact your doctor if you have any problems that may be related to the medicine you are taking.  8.  Call 911 and bring the patient back to the ED if:        A.  The seizure lasts longer than 5 minutes.       B.  The patient doesn't awaken shortly after the seizure  C.  The patient has new problems such as difficulty seeing, speaking or moving  D.  The patient was injured during the seizure  E.  The patient has a temperature over 102 F (39C)  F.  The patient vomited and now is having trouble breathing

## 2016-12-02 ENCOUNTER — Encounter: Payer: Self-pay | Admitting: Pain Medicine

## 2016-12-02 ENCOUNTER — Ambulatory Visit: Payer: Medicare Other | Attending: Pain Medicine | Admitting: Pain Medicine

## 2016-12-02 VITALS — BP 127/84 | HR 85 | Temp 98.4°F | Resp 16 | Ht 64.0 in | Wt 245.0 lb

## 2016-12-02 DIAGNOSIS — K430 Incisional hernia with obstruction, without gangrene: Secondary | ICD-10-CM | POA: Diagnosis not present

## 2016-12-02 DIAGNOSIS — K219 Gastro-esophageal reflux disease without esophagitis: Secondary | ICD-10-CM | POA: Diagnosis not present

## 2016-12-02 DIAGNOSIS — M5441 Lumbago with sciatica, right side: Secondary | ICD-10-CM | POA: Diagnosis not present

## 2016-12-02 DIAGNOSIS — M5442 Lumbago with sciatica, left side: Secondary | ICD-10-CM

## 2016-12-02 DIAGNOSIS — G43909 Migraine, unspecified, not intractable, without status migrainosus: Secondary | ICD-10-CM | POA: Insufficient documentation

## 2016-12-02 DIAGNOSIS — M4802 Spinal stenosis, cervical region: Secondary | ICD-10-CM | POA: Insufficient documentation

## 2016-12-02 DIAGNOSIS — M25532 Pain in left wrist: Secondary | ICD-10-CM

## 2016-12-02 DIAGNOSIS — G40909 Epilepsy, unspecified, not intractable, without status epilepticus: Secondary | ICD-10-CM | POA: Insufficient documentation

## 2016-12-02 DIAGNOSIS — M5136 Other intervertebral disc degeneration, lumbar region: Secondary | ICD-10-CM | POA: Diagnosis not present

## 2016-12-02 DIAGNOSIS — G894 Chronic pain syndrome: Secondary | ICD-10-CM | POA: Insufficient documentation

## 2016-12-02 DIAGNOSIS — M542 Cervicalgia: Secondary | ICD-10-CM | POA: Diagnosis not present

## 2016-12-02 DIAGNOSIS — E669 Obesity, unspecified: Secondary | ICD-10-CM | POA: Diagnosis not present

## 2016-12-02 DIAGNOSIS — M546 Pain in thoracic spine: Secondary | ICD-10-CM | POA: Insufficient documentation

## 2016-12-02 DIAGNOSIS — F411 Generalized anxiety disorder: Secondary | ICD-10-CM | POA: Diagnosis not present

## 2016-12-02 DIAGNOSIS — G35 Multiple sclerosis: Secondary | ICD-10-CM | POA: Diagnosis not present

## 2016-12-02 DIAGNOSIS — M5412 Radiculopathy, cervical region: Secondary | ICD-10-CM | POA: Diagnosis not present

## 2016-12-02 DIAGNOSIS — M79605 Pain in left leg: Secondary | ICD-10-CM | POA: Insufficient documentation

## 2016-12-02 DIAGNOSIS — M79671 Pain in right foot: Secondary | ICD-10-CM | POA: Insufficient documentation

## 2016-12-02 DIAGNOSIS — M79601 Pain in right arm: Secondary | ICD-10-CM

## 2016-12-02 DIAGNOSIS — F17210 Nicotine dependence, cigarettes, uncomplicated: Secondary | ICD-10-CM | POA: Insufficient documentation

## 2016-12-02 DIAGNOSIS — H469 Unspecified optic neuritis: Secondary | ICD-10-CM | POA: Insufficient documentation

## 2016-12-02 DIAGNOSIS — I10 Essential (primary) hypertension: Secondary | ICD-10-CM | POA: Diagnosis not present

## 2016-12-02 DIAGNOSIS — G5603 Carpal tunnel syndrome, bilateral upper limbs: Secondary | ICD-10-CM | POA: Insufficient documentation

## 2016-12-02 DIAGNOSIS — M7918 Myalgia, other site: Secondary | ICD-10-CM | POA: Diagnosis not present

## 2016-12-02 DIAGNOSIS — M79602 Pain in left arm: Secondary | ICD-10-CM | POA: Diagnosis not present

## 2016-12-02 DIAGNOSIS — M79642 Pain in left hand: Secondary | ICD-10-CM

## 2016-12-02 DIAGNOSIS — G8929 Other chronic pain: Secondary | ICD-10-CM | POA: Diagnosis not present

## 2016-12-02 DIAGNOSIS — M79641 Pain in right hand: Secondary | ICD-10-CM

## 2016-12-02 DIAGNOSIS — J039 Acute tonsillitis, unspecified: Secondary | ICD-10-CM | POA: Insufficient documentation

## 2016-12-02 DIAGNOSIS — M25531 Pain in right wrist: Secondary | ICD-10-CM

## 2016-12-02 DIAGNOSIS — Z79899 Other long term (current) drug therapy: Secondary | ICD-10-CM | POA: Insufficient documentation

## 2016-12-02 DIAGNOSIS — F4310 Post-traumatic stress disorder, unspecified: Secondary | ICD-10-CM | POA: Insufficient documentation

## 2016-12-02 DIAGNOSIS — M79604 Pain in right leg: Secondary | ICD-10-CM | POA: Insufficient documentation

## 2016-12-02 DIAGNOSIS — E559 Vitamin D deficiency, unspecified: Secondary | ICD-10-CM | POA: Diagnosis not present

## 2016-12-02 DIAGNOSIS — F329 Major depressive disorder, single episode, unspecified: Secondary | ICD-10-CM | POA: Insufficient documentation

## 2016-12-02 MED ORDER — METHOCARBAMOL 750 MG PO TABS: 750.0000 mg | ORAL_TABLET | Freq: Two times a day (BID) | ORAL | 0 refills | Status: DC

## 2016-12-02 NOTE — Patient Instructions (Addendum)
____________________________________________________________________________________________  Preparing for Procedure with Sedation Instructions: . Oral Intake: Do not eat or drink anything for at least 8 hours prior to your procedure. . Transportation: Public transportation is not allowed. Bring an adult driver. The driver must be physically present in our waiting room before any procedure can be started. . Physical Assistance: Bring an adult physically capable of assisting you, in the event you need help. This adult should keep you company at home for at least 6 hours after the procedure. . Blood Pressure Medicine: Take your blood pressure medicine with a sip of water the morning of the procedure. . Blood thinners:  . Diabetics on insulin: Notify the staff so that you can be scheduled 1st case in the morning. If your diabetes requires high dose insulin, take only  of your normal insulin dose the morning of the procedure and notify the staff that you have done so. . Preventing infections: Shower with an antibacterial soap the morning of your procedure. . Build-up your immune system: Take 1000 mg of Vitamin C with every meal (3 times a day) the day prior to your procedure. . Antibiotics: Inform the staff if you have a condition or reason that requires you to take antibiotics before dental procedures. . Pregnancy: If you are pregnant, call and cancel the procedure. . Sickness: If you have a cold, fever, or any active infections, call and cancel the procedure. . Arrival: You must be in the facility at least 30 minutes prior to your scheduled procedure. . Children: Do not bring children with you. . Dress appropriately: Bring dark clothing that you would not mind if they get stained. . Valuables: Do not bring any jewelry or valuables. Procedure appointments are reserved for interventional treatments only. . No Prescription Refills. . No medication changes will be discussed during procedure  appointments. . No disability issues will be discussed. ____________________________________________________________________________________________   GENERAL RISKS AND COMPLICATIONS  What are the risk, side effects and possible complications? Generally speaking, most procedures are safe.  However, with any procedure there are risks, side effects, and the possibility of complications.  The risks and complications are dependent upon the sites that are lesioned, or the type of nerve block to be performed.  The closer the procedure is to the spine, the more serious the risks are.  Great care is taken when placing the radio frequency needles, block needles or lesioning probes, but sometimes complications can occur. 1. Infection: Any time there is an injection through the skin, there is a risk of infection.  This is why sterile conditions are used for these blocks.  There are four possible types of infection. 1. Localized skin infection. 2. Central Nervous System Infection-This can be in the form of Meningitis, which can be deadly. 3. Epidural Infections-This can be in the form of an epidural abscess, which can cause pressure inside of the spine, causing compression of the spinal cord with subsequent paralysis. This would require an emergency surgery to decompress, and there are no guarantees that the patient would recover from the paralysis. 4. Discitis-This is an infection of the intervertebral discs.  It occurs in about 1% of discography procedures.  It is difficult to treat and it may lead to surgery.        2. Pain: the needles have to go through skin and soft tissues, will cause soreness.       3. Damage to internal structures:  The nerves to be lesioned may be near blood vessels or      other nerves which can be potentially damaged.       4. Bleeding: Bleeding is more common if the patient is taking blood thinners such as  aspirin, Coumadin, Ticiid, Plavix, etc., or if he/she have some genetic  predisposition  such as hemophilia. Bleeding into the spinal canal can cause compression of the spinal  cord with subsequent paralysis.  This would require an emergency surgery to  decompress and there are no guarantees that the patient would recover from the  paralysis.       5. Pneumothorax:  Puncturing of a lung is a possibility, every time a needle is introduced in  the area of the chest or upper back.  Pneumothorax refers to free air around the  collapsed lung(s), inside of the thoracic cavity (chest cavity).  Another two possible  complications related to a similar event would include: Hemothorax and Chylothorax.   These are variations of the Pneumothorax, where instead of air around the collapsed  lung(s), you may have blood or chyle, respectively.       6. Spinal headaches: They may occur with any procedures in the area of the spine.       7. Persistent CSF (Cerebro-Spinal Fluid) leakage: This is a rare problem, but may occur  with prolonged intrathecal or epidural catheters either due to the formation of a fistulous  track or a dural tear.       8. Nerve damage: By working so close to the spinal cord, there is always a possibility of  nerve damage, which could be as serious as a permanent spinal cord injury with  paralysis.       9. Death:  Although rare, severe deadly allergic reactions known as "Anaphylactic  reaction" can occur to any of the medications used.      10. Worsening of the symptoms:  We can always make thing worse.  What are the chances of something like this happening? Chances of any of this occuring are extremely low.  By statistics, you have more of a chance of getting killed in a motor vehicle accident: while driving to the hospital than any of the above occurring .  Nevertheless, you should be aware that they are possibilities.  In general, it is similar to taking a shower.  Everybody knows that you can slip, hit your head and get killed.  Does that mean that you should not  shower again?  Nevertheless always keep in mind that statistics do not mean anything if you happen to be on the wrong side of them.  Even if a procedure has a 1 (one) in a 1,000,000 (million) chance of going wrong, it you happen to be that one..Also, keep in mind that by statistics, you have more of a chance of having something go wrong when taking medications.  Who should not have this procedure? If you are on a blood thinning medication (e.g. Coumadin, Plavix, see list of "Blood Thinners"), or if you have an active infection going on, you should not have the procedure.  If you are taking any blood thinners, please inform your physician.  How should I prepare for this procedure?  Do not eat or drink anything at least six hours prior to the procedure.  Bring a driver with you .  It cannot be a taxi.  Come accompanied by an adult that can drive you back, and that is strong enough to help you if your legs get weak or numb from the local anesthetic.  Take   all of your medicines the morning of the procedure with just enough water to swallow them.  If you have diabetes, make sure that you are scheduled to have your procedure done first thing in the morning, whenever possible.  If you have diabetes, take only half of your insulin dose and notify our nurse that you have done so as soon as you arrive at the clinic.  If you are diabetic, but only take blood sugar pills (oral hypoglycemic), then do not take them on the morning of your procedure.  You may take them after you have had the procedure.  Do not take aspirin or any aspirin-containing medications, at least eleven (11) days prior to the procedure.  They may prolong bleeding.  Wear loose fitting clothing that may be easy to take off and that you would not mind if it got stained with Betadine or blood.  Do not wear any jewelry or perfume  Remove any nail coloring.  It will interfere with some of our monitoring equipment.  NOTE: Remember that  this is not meant to be interpreted as a complete list of all possible complications.  Unforeseen problems may occur.  BLOOD THINNERS The following drugs contain aspirin or other products, which can cause increased bleeding during surgery and should not be taken for 2 weeks prior to and 1 week after surgery.  If you should need take something for relief of minor pain, you may take acetaminophen which is found in Tylenol,m Datril, Anacin-3 and Panadol. It is not blood thinner. The products listed below are.  Do not take any of the products listed below in addition to any listed on your instruction sheet.  A.P.C or A.P.C with Codeine Codeine Phosphate Capsules #3 Ibuprofen Ridaura  ABC compound Congesprin Imuran rimadil  Advil Cope Indocin Robaxisal  Alka-Seltzer Effervescent Pain Reliever and Antacid Coricidin or Coricidin-D  Indomethacin Rufen  Alka-Seltzer plus Cold Medicine Cosprin Ketoprofen S-A-C Tablets  Anacin Analgesic Tablets or Capsules Coumadin Korlgesic Salflex  Anacin Extra Strength Analgesic tablets or capsules CP-2 Tablets Lanoril Salicylate  Anaprox Cuprimine Capsules Levenox Salocol  Anexsia-D Dalteparin Magan Salsalate  Anodynos Darvon compound Magnesium Salicylate Sine-off  Ansaid Dasin Capsules Magsal Sodium Salicylate  Anturane Depen Capsules Marnal Soma  APF Arthritis pain formula Dewitt's Pills Measurin Stanback  Argesic Dia-Gesic Meclofenamic Sulfinpyrazone  Arthritis Bayer Timed Release Aspirin Diclofenac Meclomen Sulindac  Arthritis pain formula Anacin Dicumarol Medipren Supac  Analgesic (Safety coated) Arthralgen Diffunasal Mefanamic Suprofen  Arthritis Strength Bufferin Dihydrocodeine Mepro Compound Suprol  Arthropan liquid Dopirydamole Methcarbomol with Aspirin Synalgos  ASA tablets/Enseals Disalcid Micrainin Tagament  Ascriptin Doan's Midol Talwin  Ascriptin A/D Dolene Mobidin Tanderil  Ascriptin Extra Strength Dolobid Moblgesic Ticlid  Ascriptin with Codeine  Doloprin or Doloprin with Codeine Momentum Tolectin  Asperbuf Duoprin Mono-gesic Trendar  Aspergum Duradyne Motrin or Motrin IB Triminicin  Aspirin plain, buffered or enteric coated Durasal Myochrisine Trigesic  Aspirin Suppositories Easprin Nalfon Trillsate  Aspirin with Codeine Ecotrin Regular or Extra Strength Naprosyn Uracel  Atromid-S Efficin Naproxen Ursinus  Auranofin Capsules Elmiron Neocylate Vanquish  Axotal Emagrin Norgesic Verin  Azathioprine Empirin or Empirin with Codeine Normiflo Vitamin E  Azolid Emprazil Nuprin Voltaren  Bayer Aspirin plain, buffered or children's or timed BC Tablets or powders Encaprin Orgaran Warfarin Sodium  Buff-a-Comp Enoxaparin Orudis Zorpin  Buff-a-Comp with Codeine Equegesic Os-Cal-Gesic   Buffaprin Excedrin plain, buffered or Extra Strength Oxalid   Bufferin Arthritis Strength Feldene Oxphenbutazone   Bufferin plain or Extra Strength Feldene Capsules   Oxycodone with Aspirin   Bufferin with Codeine Fenoprofen Fenoprofen Pabalate or Pabalate-SF   Buffets II Flogesic Panagesic   Buffinol plain or Extra Strength Florinal or Florinal with Codeine Panwarfarin   Buf-Tabs Flurbiprofen Penicillamine   Butalbital Compound Four-way cold tablets Penicillin   Butazolidin Fragmin Pepto-Bismol   Carbenicillin Geminisyn Percodan   Carna Arthritis Reliever Geopen Persantine   Carprofen Gold's salt Persistin   Chloramphenicol Goody's Phenylbutazone   Chloromycetin Haltrain Piroxlcam   Clmetidine heparin Plaquenil   Cllnoril Hyco-pap Ponstel   Clofibrate Hydroxy chloroquine Propoxyphen         Before stopping any of these medications, be sure to consult the physician who ordered them.  Some, such as Coumadin (Warfarin) are ordered to prevent or treat serious conditions such as "deep thrombosis", "pumonary embolisms", and other heart problems.  The amount of time that you may need off of the medication may also vary with the medication and the reason for which  you were taking it.  If you are taking any of these medications, please make sure you notify your pain physician before you undergo any procedures.         Epidural Steroid Injection Patient Information  Description: The epidural space surrounds the nerves as they exit the spinal cord.  In some patients, the nerves can be compressed and inflamed by a bulging disc or a tight spinal canal (spinal stenosis).  By injecting steroids into the epidural space, we can bring irritated nerves into direct contact with a potentially helpful medication.  These steroids act directly on the irritated nerves and can reduce swelling and inflammation which often leads to decreased pain.  Epidural steroids may be injected anywhere along the spine and from the neck to the low back depending upon the location of your pain.   After numbing the skin with local anesthetic (like Novocaine), a small needle is passed into the epidural space slowly.  You may experience a sensation of pressure while this is being done.  The entire block usually last less than 10 minutes.  Conditions which may be treated by epidural steroids:   Low back and leg pain  Neck and arm pain  Spinal stenosis  Post-laminectomy syndrome  Herpes zoster (shingles) pain  Pain from compression fractures  Preparation for the injection:  1. Do not eat any solid food or dairy products within 8 hours of your appointment.  2. You may drink clear liquids up to 3 hours before appointment.  Clear liquids include water, black coffee, juice or soda.  No milk or cream please. 3. You may take your regular medication, including pain medications, with a sip of water before your appointment  Diabetics should hold regular insulin (if taken separately) and take 1/2 normal NPH dos the morning of the procedure.  Carry some sugar containing items with you to your appointment. 4. A driver must accompany you and be prepared to drive you home after your procedure.   5. Bring all your current medications with your. 6. An IV may be inserted and sedation may be given at the discretion of the physician.   7. A blood pressure cuff, EKG and other monitors will often be applied during the procedure.  Some patients may need to have extra oxygen administered for a short period. 8. You will be asked to provide medical information, including your allergies, prior to the procedure.  We must know immediately if you are taking blood thinners (like Coumadin/Warfarin)  Or if you are allergic to  IV iodine contrast (dye). We must know if you could possible be pregnant.  Possible side-effects:  Bleeding from needle site  Infection (rare, may require surgery)  Nerve injury (rare)  Numbness & tingling (temporary)  Difficulty urinating (rare, temporary)  Spinal headache ( a headache worse with upright posture)  Light -headedness (temporary)  Pain at injection site (several days)  Decreased blood pressure (temporary)  Weakness in arm/leg (temporary)  Pressure sensation in back/neck (temporary)  Call if you experience:  Fever/chills associated with headache or increased back/neck pain.  Headache worsened by an upright position.  New onset weakness or numbness of an extremity below the injection site  Hives or difficulty breathing (go to the emergency room)  Inflammation or drainage at the infection site  Severe back/neck pain  Any new symptoms which are concerning to you  Please note:  Although the local anesthetic injected can often make your back or neck feel good for several hours after the injection, the pain will likely return.  It takes 3-7 days for steroids to work in the epidural space.  You may not notice any pain relief for at least that one week.  If effective, we will often do a series of three injections spaced 3-6 weeks apart to maximally decrease your pain.  After the initial series, we generally will wait several months before  considering a repeat injection of the same type.  If you have any questions, please call [phone] [facility]

## 2016-12-02 NOTE — Progress Notes (Signed)
Safety precautions to be maintained throughout the outpatient stay will include: orient to surroundings, keep bed in low position, maintain call bell within reach at all times, provide assistance with transfer out of bed and ambulation.  

## 2016-12-02 NOTE — Progress Notes (Signed)
Patient's Name: Emily [name]  MRN: [id number]  Referring Provider: No ref. provider found  DOB: 28-Jun-1980  PCP: Patient, No Pcp Per  DOS: 12/02/2016  Note by: Gaspar Cola, MD  Service setting: Ambulatory outpatient  Specialty: Interventional Pain Management  Location: [facility] (AMB) Pain Management Facility    Patient type: Established   Primary Reason(s) for Visit: Encounter for post-procedure evaluation of chronic illness with mild to moderate exacerbation CC: Neck Pain (between shoulder blades, right is worse)  HPI  Ms. Maahs is a 36 y.o. year old, female patient, who comes today for a post-procedure evaluation. She has Depression; Multiple sclerosis ([place]); Migraine; Seizure disorder ([place]); Smoker; Obesity; HTN (hypertension); Perforated bowel ([place]); Conversion disorder with attacks or seizures, persistent, with psychological stressor; Migraine with aura and without status migrainosus, not intractable; Generalized anxiety disorder; Right optic neuritis; Difficult intravenous access; PTSD (post-traumatic stress disorder); Conversion disorder with seizures or convulsions; Severe episode of recurrent major depressive disorder, without psychotic features ([place]); GAD (generalized anxiety disorder); Postpartum endometritis; Paresthesia; Chronic pain syndrome; Substance abuse ([place]); Carpal tunnel syndrome (Tertiary Area of Pain) (Bilateral) (L>R); Incisional hernia; Incarcerated incisional hernia; DDD (degenerative disc disease), cervical; Laryngopharyngeal reflux; Tonsillitis; Opiate use; Chronic neck pain (Primary Area of Pain) (Bilateral) (R>L); Chronic low back pain (Secondary Area of Pain) (Bilateral) (R>L); Chronic knee pain (Fifth Area of Pain) (Left); Midline thoracic back pain; Chronic lower extremity pain (Bilateral); Major depressive disorder, single episode; Vitamin D deficiency; Chronic upper back pain (Primary Area of Pain) (Bilateral) (R>L); Chronic hand pain (Tertiary Area of Pain)  (Bilateral) (L>R); Chronic wrist pain (Tertiary Area of Pain) (Bilateral) (L>R); Chronic foot/toes pain (Fourth Area of Pain) (Bilateral) (R>L); Chronic upper extremity pain (Secondary Area of Pain) (Bilateral) (L>R); Long term prescription benzodiazepine use; 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 Her primarily concern today is the Neck Pain (between shoulder blades, right is worse)  Pain Assessment: Location: Right, Left Neck Radiating: left shoulder and in between shoulder blades Onset: More than a month ago Duration: Chronic pain Quality: Aching, Constant, Stabbing Severity: 7 /10 (self-reported pain score)  Note: Reported level is compatible with observation.                         When using our objective Pain Scale, levels between 6 and 10/10 are said to belong in an emergency room, as it progressively worsens from a 6/10, described as severely limiting, requiring emergency care not usually available at an outpatient pain management facility. At a 6/10 level, communication becomes difficult and requires great effort. Assistance to reach the emergency department may be required. Facial flushing and profuse sweating along with potentially dangerous increases in heart rate and blood pressure will be evident. Effect on ADL:   Timing: Constant Modifying factors: medications, heat, positioning  Ms. Shrum comes in today for post-procedure evaluation after the treatment done on 11/18/2016.  Further details on both, my assessment(s), as well as the proposed treatment plan, please see below.  Post-Procedure Assessment  11/18/2016 Procedure: Diagnostic right-sided cervical epidural  steroid injection under fluoroscopic guidance and IV sedation Pre-procedure pain score:  7/10 Post-procedure pain score: 0/10 (100% relief) Influential Factors: BMI: 42.05 kg/m Intra-procedural challenges: None observed.  Assessment challenges: None detected.              Reported side-effects: None.        Post-procedural adverse reactions or complications: None reported         Sedation: Please see nurses note. When no sedatives are used, the analgesic levels obtained are directly associated to the effectiveness of the local anesthetics. However, when sedation is provided, the level of analgesia obtained during the initial 1 hour following the intervention, is believed to be the result of a combination of factors. These factors may include, but are not limited to: 1. The effectiveness of the local anesthetics used. 2. The effects of the analgesic(s) and/or anxiolytic(s) used. 3. The degree of discomfort experienced by the patient at the time of the procedure. 4. The patients ability and reliability in recalling and recording the events. 5. The presence and influence of possible secondary gains and/or psychosocial factors. Reported result: Relief experienced during the 1st hour after the procedure: 100 % (Ultra-Short Term Relief)            Interpretative annotation: Clinically appropriate result. Analgesia during this period is likely to be Local Anesthetic and/or IV Sedative (Analgesic/Anxiolytic) related.          Effects of local anesthetic: The analgesic effects attained during this period are directly associated to the localized infiltration of local anesthetics and therefore cary significant diagnostic value as to the etiological location, or anatomical origin, of the pain. Expected duration of relief is directly dependent on the pharmacodynamics of the local anesthetic used. Long-acting (4-6 hours) anesthetics used.  Reported result: Relief during the next 4 to 6 hour after the  procedure: 90 % (Short-Term Relief)            Interpretative annotation: Clinically appropriate result. Analgesia during this period is likely to be Local Anesthetic-related.          Long-term benefit: Defined as the period of time past the expected duration of local anesthetics (1 hour for short-acting and 4-6 hours for long-acting). With the possible exception of prolonged sympathetic blockade from the local anesthetics, benefits during this period are typically attributed to, or associated with, other factors such as analgesic sensory neuropraxia, antiinflammatory effects, or beneficial biochemical changes provided by agents other than the local anesthetics.  Reported result: Extended relief following procedure: 30 %(for about 10 days, then pain returned to 7-8/10) (Long-Term Relief)            Interpretative annotation: Clinically appropriate result. Partial relief. No permanent benefit expected. Inflammation plays a part in the etiology to the pain.          Current benefits: Defined as reported results that persistent at this point in time.   Analgesia: <25 %            Function: Somewhat improved ROM: Somewhat improved Interpretative annotation: Recurrence of symptoms. No permanent benefit expected. Effective therapeutic approach.          Interpretation: Results would suggest a successful diagnostic intervention.                  Plan:  Proceed with treatment No.: 2  Laboratory Chemistry  Inflammation Markers (CRP: Acute Phase) (ESR: Chronic Phase) Lab Results  Component Value Date   CRP 2.0 07/23/2016   ESRSEDRATE 14 07/23/2016                 Renal Function Markers Lab Results  Component Value Date   BUN  11 07/23/2016   CREATININE 0.80 07/23/2016   GFRAA 108 07/23/2016   GFRNONAA 94 07/23/2016                 Hepatic Function Markers Lab Results  Component Value Date   AST 12 07/23/2016   ALT 10 07/23/2016   ALBUMIN 4.0 07/23/2016   ALKPHOS 88 07/23/2016                  Electrolytes Lab Results  Component Value Date   NA 141 07/23/2016   K 4.0 07/23/2016   CL 106 07/23/2016   CALCIUM 8.7 07/23/2016   MG 2.1 07/23/2016                 Neuropathy Markers Lab Results  Component Value Date   VITAMINB12 456 07/23/2016                 Bone Pathology Markers Lab Results  Component Value Date   ALKPHOS 88 07/23/2016   25OHVITD1 19 (L) 07/23/2016   25OHVITD2 <1.0 07/23/2016   25OHVITD3 19 07/23/2016   CALCIUM 8.7 07/23/2016                 Rheumatology Markers Lab Results  Component Value Date   LABURIC 3.5 02/15/2015                Coagulation Parameters Lab Results  Component Value Date   PLT 440 (H) 02/17/2016   DDIMER 0.33 10/29/2013                 Cardiovascular Markers Lab Results  Component Value Date   TROPONINI <0.03 05/23/2014   HGB 13.6 02/17/2016   HCT 39.8 02/17/2016                 CA Markers No results found for: CEA, CA125, LABCA2               Note: Lab results reviewed.  Recent Diagnostic Imaging Results  DG C-Arm 1-60 Min-No Report Fluoroscopy was utilized by the requesting physician.  No radiographic  interpretation.   Complexity Note: Imaging results reviewed. Results shared with Ms. Buczek, using Layman's terms.                         Meds   Current Outpatient Medications:  .  albuterol (PROVENTIL HFA;VENTOLIN HFA) 108 (90 Base) MCG/ACT inhaler, Inhale 2 puffs into the lungs every 6 (six) hours as needed for wheezing or shortness of breath., Disp: 1 Inhaler, Rfl: 0 .  amLODipine (NORVASC) 5 MG tablet, Take 5 mg by mouth every evening., Disp: , Rfl: 2 .  busPIRone (BUSPAR) 15 MG tablet, Take 1 tablet (15 mg total) by mouth 3 (three) times daily., Disp: 90 tablet, Rfl: 3 .  Calcium Carb-Cholecalciferol (CALCIUM PLUS D3 ABSORBABLE) [phone] MG-UNIT CAPS, Take 1 capsule by mouth daily with breakfast., Disp: 90 capsule, Rfl: 0 .  Cholecalciferol 5000 units capsule, Take 1 capsule (5,000 Units  total) by mouth daily., Disp: 90 capsule, Rfl: 0 .  diazepam (VALIUM) 5 MG tablet, Take 1 tablet (5 mg total) by mouth daily. Take 1 tablet as needed for panic attacks leading to seizures., Disp: 30 tablet, Rfl: 3 .  diphenhydrAMINE (SOMINEX) 25 MG tablet, Take 2 tablets (50 mg total) by mouth at bedtime as needed for sleep., Disp: 60 tablet, Rfl: 3 .  divalproex (DEPAKOTE) 500 MG DR tablet, Take 1 tablet (500 mg total) by mouth  at bedtime., Disp: 90 tablet, Rfl: 3 .  EPINEPHrine (EPIPEN) 0.3 mg/0.3 mL SOAJ injection, Inject 0.3 mg into the muscle as needed (allergic reaction). Reported on 03/28/2015, Disp: , Rfl:  .  ergocalciferol (VITAMIN D2) 50000 units capsule, Take 1 capsule (50,000 Units total) by mouth 2 (two) times a week. X 6 weeks., Disp: 12 capsule, Rfl: 0 .  etonogestrel (NEXPLANON) 68 MG IMPL implant, 1 each by Subdermal route once., Disp: , Rfl:  .  Fingolimod HCl (GILENYA) 0.5 MG CAPS, Take by mouth daily., Disp: , Rfl:  .  FLUoxetine (PROZAC) 40 MG capsule, Take 2 capsules (80 mg total) by mouth daily., Disp: 60 capsule, Rfl: 3 .  ibuprofen (ADVIL,MOTRIN) 200 MG tablet, Take 600 mg by mouth every 6 (six) hours as needed for headache or moderate pain., Disp: , Rfl:  .  methocarbamol (ROBAXIN) 750 MG tablet, Take 1 tablet (750 mg total) by mouth 2 (two) times daily., Disp: 180 tablet, Rfl: 0 .  metoCLOPramide (REGLAN) 10 MG tablet, TAKE 1 TABLET AS NEEDED 2-3 TIMES A WEEK., Disp: 10 tablet, Rfl: 0 .  Multiple Vitamins-Minerals (MULTIVITAMIN ADULT PO), Take 1 tablet by mouth daily. , Disp: , Rfl:  .  OnabotulinumtoxinA (BOTOX IJ), Inject as directed every 3 (three) months., Disp: , Rfl:  .  ranitidine (ZANTAC) 300 MG capsule, Take 300 mg by mouth at bedtime. Reported on 07/21/2015, Disp: , Rfl:  .  sulfamethoxazole-trimethoprim (BACTRIM,SEPTRA) 400-80 MG tablet, Take 1 tablet by mouth 2 (two) times daily., Disp: , Rfl:  .  SUMAtriptan (IMITREX) 100 MG tablet, Take 1 tablet (100 mg total)  by mouth every 2 (two) hours as needed for migraine. May repeat in 2 hours if headache persists or recurs., Disp: 9 tablet, Rfl: 6 .  topiramate (TOPAMAX) 50 MG tablet, Take 1 tablet in AM, 2 tablets in PM, Disp: 270 tablet, Rfl: 3  Current Facility-Administered Medications:  .  metoCLOPramide (REGLAN) 10 mg in dextrose 5 % 50 mL IVPB, 10 mg, Intravenous, Once, Delice Lesch, Lezlie Octave, MD  Facility-Administered Medications Ordered in Other Visits:  .  metoCLOPramide (REGLAN) injection 10 mg, 10 mg, Intravenous, Once, Milton Ferguson, MD  ROS  Constitutional: Denies any fever or chills Gastrointestinal: No reported hemesis, hematochezia, vomiting, or acute GI distress Musculoskeletal: Denies any acute onset joint swelling, redness, loss of ROM, or weakness Neurological: No reported episodes of acute onset apraxia, aphasia, dysarthria, agnosia, amnesia, paralysis, loss of coordination, or loss of consciousness  Allergies  Ms. Buller is allergic to amitriptyline; baclofen; cymbalta [duloxetine hcl]; gabapentin; monosodium glutamate; other; vicodin [hydrocodone-acetaminophen]; magnesium salicylate; rizatriptan; tizanidine; tramadol; adhesive [tape]; lamotrigine; and toradol [ketorolac tromethamine].  PFSH  Drug: Ms. Duley  reports that she does not use drugs. Alcohol:  reports that she does not drink alcohol. Tobacco:  reports that she quit smoking about 16 months ago. Her smoking use included cigarettes. She has a 2.50 pack-year smoking history. she has never used smokeless tobacco. Medical:  has a past medical history of Anxiety, Asthma (as a child), Chronic back pain, Chronic pain, Conversion disorder, Depression, GERD (gastroesophageal reflux disease), Headache(784.0), HTN (hypertension) (02/27/2015), JC virus antibody positive, Migraines, MS (multiple sclerosis) ([place]), Multiple sclerosis ([place]), Ovarian cyst, Panic attack, Perforated bowel ([place]) (2009), Pseudoseizures, PTSD (post-traumatic stress  disorder), S/P emergency C-section, and Urinary urgency. Surgical: Ms. 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 Family: family history includes Arthritis in her father; Cancer in her maternal grandfather and maternal grandmother; Diabetes in her father and mother; Hypertension in her father and mother.  Constitutional Exam  General appearance: Well nourished, well developed, and well hydrated. In no apparent acute distress Vitals:   12/02/16 1401  BP: 127/84  Pulse: 85  Resp: 16  Temp: 98.4 F (36.9 C)  TempSrc: Oral  SpO2: 100%  Weight: 245 lb (111.1 kg)  Height: 5' 4"  (1.626 m)   BMI Assessment: Estimated body mass index is 42.05 kg/m as calculated from the following:   Height as of this encounter: 5' 4"  (1.626 m).   Weight as of this encounter: 245 lb (111.1 kg).  BMI interpretation table: BMI level Category Range association with higher incidence of chronic pain  <18 kg/m2 Underweight   18.5-24.9 kg/m2 Ideal body weight   25-29.9 kg/m2 Overweight Increased incidence by 20%  30-34.9 kg/m2 Obese (Class I) Increased incidence by 68%  35-39.9 kg/m2 Severe obesity (Class II) Increased incidence by 136%  >40 kg/m2 Extreme obesity (Class III) Increased incidence by 254%   BMI Readings from Last 4 Encounters:  12/02/16 42.05 kg/m  11/27/16 42.57 kg/m  11/12/16 42.91 kg/m  10/28/16 42.91 kg/m   Wt Readings from Last 4 Encounters:  12/02/16 245 lb (111.1 kg)  11/27/16 248 lb (112.5 kg)  11/12/16 250 lb (113.4 kg)  10/28/16 250 lb (113.4 kg)  Psych/Mental status: Alert, oriented x 3 (person, place, & time)       Eyes: PERLA Respiratory: No evidence of acute  respiratory distress  Cervical Spine Area Exam  Skin & Axial Inspection: No masses, redness, edema, swelling, or associated skin lesions Alignment: Symmetrical Functional ROM: Improved after treatment      Stability: No instability detected Muscle Tone/Strength: Functionally intact. No obvious neuro-muscular anomalies detected. Sensory (Neurological): Movement-associated discomfort Palpation: Complains of area being tender to palpation              Upper Extremity (UE) Exam    Side: Right upper extremity  Side: Left upper extremity  Skin & Extremity Inspection: Skin color, temperature, and hair growth are WNL. No peripheral edema or cyanosis. No masses, redness, swelling, asymmetry, or associated skin lesions. No contractures.  Skin & Extremity Inspection: Skin color, temperature, and hair growth are WNL. No peripheral edema or cyanosis. No masses, redness, swelling, asymmetry, or associated skin lesions. No contractures.  Functional ROM: Unrestricted ROM          Functional ROM: Unrestricted ROM          Muscle Tone/Strength: Functionally intact. No obvious neuro-muscular anomalies detected.  Muscle Tone/Strength: Functionally intact. No obvious neuro-muscular anomalies detected.  Sensory (Neurological): Unimpaired          Sensory (Neurological): Unimpaired          Palpation: No palpable anomalies              Palpation: No palpable anomalies              Specialized Test(s): Deferred         Specialized Test(s): Deferred          Thoracic Spine Area Exam  Skin & Axial Inspection: No masses, redness, or swelling Alignment: Symmetrical Functional ROM: Unrestricted ROM Stability: No instability detected Muscle Tone/Strength: Functionally intact. No obvious neuro-muscular anomalies detected. Sensory (Neurological): Unimpaired Muscle strength & Tone: No palpable  anomalies  Lumbar Spine Area Exam  Skin & Axial Inspection: No masses, redness, or swelling Alignment: Symmetrical Functional  ROM: Unrestricted ROM      Stability: No instability detected Muscle Tone/Strength: Functionally intact. No obvious neuro-muscular anomalies detected. Sensory (Neurological): Unimpaired Palpation: No palpable anomalies       Provocative Tests: Lumbar Hyperextension and rotation test: evaluation deferred today       Lumbar Lateral bending test: evaluation deferred today       Patrick's Maneuver: evaluation deferred today                    Gait & Posture Assessment  Ambulation: Unassisted Gait: Relatively normal for age and body habitus Posture: WNL   Lower Extremity Exam    Side: Right lower extremity  Side: Left lower extremity  Skin & Extremity Inspection: Skin color, temperature, and hair growth are WNL. No peripheral edema or cyanosis. No masses, redness, swelling, asymmetry, or associated skin lesions. No contractures.  Skin & Extremity Inspection: Skin color, temperature, and hair growth are WNL. No peripheral edema or cyanosis. No masses, redness, swelling, asymmetry, or associated skin lesions. No contractures.  Functional ROM: Unrestricted ROM          Functional ROM: Unrestricted ROM          Muscle Tone/Strength: Functionally intact. No obvious neuro-muscular anomalies detected.  Muscle Tone/Strength: Functionally intact. No obvious neuro-muscular anomalies detected.  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 Diagnoses of Chronic upper extremity pain (Secondary Area of Pain) (Bilateral) (L>R), Chronic neck pain (Primary Area of Pain) (Bilateral) (R>L), Chronic low back pain (Secondary Area of Pain) (Bilateral) (R>L), Chronic hand pain (Tertiary Area of Pain) (Bilateral) (L>R), Carpal tunnel syndrome (Tertiary Area of Pain) (Bilateral) (L>R),  Chronic wrist pain (Tertiary Area of Pain) (Bilateral) (L>R), and Chronic musculoskeletal pain were also pertinent to this visit.  Status Diagnosis  Stable Improved Improving 1. 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 Chronic upper extremity pain (Secondary Area of Pain) (Bilateral) (L>R)   3. Chronic neck pain (Primary Area of Pain) (Bilateral) (R>L)   4. Chronic low back pain (Secondary Area of Pain) (Bilateral) (R>L)   5. Chronic hand pain (Tertiary Area of Pain) (Bilateral) (L>R)   6. Carpal tunnel syndrome (Tertiary Area of Pain) (Bilateral) (L>R)   7. Chronic wrist pain (Tertiary Area of Pain) (Bilateral) (L>R)   8. Chronic musculoskeletal pain     Problems updated and reviewed during this visit: No problems updated. Plan of Care  Pharmacotherapy (Medications Ordered): Meds ordered this encounter  Medications  . methocarbamol (ROBAXIN) 750 MG tablet    Sig: Take 1 tablet (750 mg total) by mouth 2 (two) times daily.    Dispense:  180 tablet    Refill:  0    Do not place medication on "Automatic Refill". Fill one day early if pharmacy is closed on scheduled refill date. Please provide generic.  This SmartLink is deprecated. Use AVSMEDLIST instead to display the medication list for a patient. Medications administered today: Millie L. [place] had no medications administered during this visit.   Procedure Orders     Cervical Epidural Injection     Cervical Epidural Injection Lab Orders  No laboratory test(s) ordered today   Imaging Orders  No imaging studies  ordered today   Referral Orders  No referral(s) requested today    Interventional management options: Planned, scheduled, and/or pending:   Diagnostic right-sided CESI #2 under fluoro. Stop ASA x 11 days.   Considering:   Possiblebilateral thoracic epidural steroid injection  Possiblebilateral thoracic facet injections  Possiblebilateral thoracic radiofrequency ablation  Possible right sided  lumbar epidural steroid injection  Possibleright-sided lumbar facet injection  Possibleright-sided lumbar radiofrequency ablation  Possibleleft intra-articular knee injection  Possibleleft knee genicular nerve block  Possibleleft knee radiofrequency ablation  Possibleright-sided cervical epidural steroid injection  possibleright-sided cervical facet block  Possibleright-sided cervical radiofrequency ablation  Possibleright occipital nerve block    Palliative PRN treatment(s):   None at this time   Provider-requested follow-up: Return for Procedure (w/ sedation): (R) CESI #2.  Future Appointments  Date Time Provider [place]  12/10/2016  1:15 PM Milinda Pointer, MD [facility] None  01/10/2017  1:00 PM Pieter Partridge, DO [facility] None  02/13/2017  1:00 PM Charlcie Cradle, MD [facility] None  05/12/2017 11:30 AM Cameron Sprang, MD [facility] None   Primary Care Physician: Patient, No Pcp Per Location: [facility] Out[facility] Pain Management Facility Note by: Gaspar Cola, MD Date: 12/02/2016; Time: 3:28 PM

## 2016-12-03 DIAGNOSIS — M79602 Pain in left arm: Secondary | ICD-10-CM | POA: Diagnosis not present

## 2016-12-03 DIAGNOSIS — G8929 Other chronic pain: Secondary | ICD-10-CM | POA: Diagnosis not present

## 2016-12-03 DIAGNOSIS — M542 Cervicalgia: Secondary | ICD-10-CM | POA: Diagnosis not present

## 2016-12-03 DIAGNOSIS — M79601 Pain in right arm: Secondary | ICD-10-CM | POA: Diagnosis not present

## 2016-12-06 ENCOUNTER — Telehealth: Payer: Self-pay | Admitting: Pain Medicine

## 2016-12-06 NOTE — Telephone Encounter (Signed)
error 

## 2016-12-10 ENCOUNTER — Ambulatory Visit (HOSPITAL_BASED_OUTPATIENT_CLINIC_OR_DEPARTMENT_OTHER): Payer: Medicare Other | Admitting: Pain Medicine

## 2016-12-10 ENCOUNTER — Encounter: Payer: Self-pay | Admitting: Pain Medicine

## 2016-12-10 ENCOUNTER — Ambulatory Visit
Admission: RE | Admit: 2016-12-10 | Discharge: 2016-12-10 | Disposition: A | Discharge status: Home / Self Care | Payer: Medicare Other | Source: Ambulatory Visit | Attending: Pain Medicine | Admitting: Pain Medicine

## 2016-12-10 ENCOUNTER — Other Ambulatory Visit: Payer: Self-pay

## 2016-12-10 VITALS — BP 146/82 | HR 73 | Temp 97.8°F | Resp 15 | Ht 64.0 in | Wt 245.0 lb

## 2016-12-10 DIAGNOSIS — M79601 Pain in right arm: Secondary | ICD-10-CM

## 2016-12-10 DIAGNOSIS — M79602 Pain in left arm: Secondary | ICD-10-CM

## 2016-12-10 DIAGNOSIS — M5412 Radiculopathy, cervical region: Secondary | ICD-10-CM | POA: Insufficient documentation

## 2016-12-10 DIAGNOSIS — M4802 Spinal stenosis, cervical region: Secondary | ICD-10-CM | POA: Diagnosis not present

## 2016-12-10 DIAGNOSIS — M542 Cervicalgia: Secondary | ICD-10-CM

## 2016-12-10 DIAGNOSIS — G8929 Other chronic pain: Secondary | ICD-10-CM

## 2016-12-10 DIAGNOSIS — M549 Dorsalgia, unspecified: Secondary | ICD-10-CM

## 2016-12-10 MED ORDER — LACTATED RINGERS IV SOLN
1000.0000 mL | Freq: Once | INTRAVENOUS | Status: AC
  Administered 2016-12-10: 1000 mL via INTRAVENOUS

## 2016-12-10 MED ORDER — ROPIVACAINE HCL 2 MG/ML IJ SOLN
1.0000 mL | Freq: Once | INTRAMUSCULAR | Status: AC
  Administered 2016-12-10: 1 mL via EPIDURAL
  Filled 2016-12-10: qty 10

## 2016-12-10 MED ORDER — GLYCOPYRROLATE 0.2 MG/ML IJ SOLN
0.2000 mg | Freq: Once | INTRAMUSCULAR | Status: AC
  Administered 2016-12-10: 0.2 mg via INTRAVENOUS
  Filled 2016-12-10: qty 1

## 2016-12-10 MED ORDER — FENTANYL CITRATE (PF) 100 MCG/2ML IJ SOLN
25.0000 ug | INTRAMUSCULAR | Status: AC | PRN
Start: 2016-12-10 — End: 2016-12-10
  Administered 2016-12-10 (×2): 50 ug via INTRAVENOUS
  Filled 2016-12-10: qty 2

## 2016-12-10 MED ORDER — IOPAMIDOL (ISOVUE-M 200) INJECTION 41%
10.0000 mL | Freq: Once | INTRAMUSCULAR | Status: AC
  Administered 2016-12-10: 10 mL via EPIDURAL
  Filled 2016-12-10: qty 10

## 2016-12-10 MED ORDER — LIDOCAINE HCL 2 % IJ SOLN
10.0000 mL | Freq: Once | INTRAMUSCULAR | Status: AC
  Administered 2016-12-10: 400 mg
  Filled 2016-12-10: qty 20

## 2016-12-10 MED ORDER — MIDAZOLAM HCL 5 MG/5ML IJ SOLN
1.0000 mg | INTRAMUSCULAR | Status: DC | PRN
  Administered 2016-12-10: 1 mg via INTRAVENOUS
  Administered 2016-12-10 (×2): 2 mg via INTRAVENOUS
  Filled 2016-12-10: qty 5

## 2016-12-10 MED ORDER — SODIUM CHLORIDE 0.9% FLUSH
1.0000 mL | Freq: Once | INTRAVENOUS | Status: AC
Start: 2016-12-10 — End: 2016-12-10
  Administered 2016-12-10: 1 mL

## 2016-12-10 MED ORDER — DEXAMETHASONE SODIUM PHOSPHATE 10 MG/ML IJ SOLN
10.0000 mg | Freq: Once | INTRAMUSCULAR | Status: AC
  Administered 2016-12-10: 10 mg
  Filled 2016-12-10: qty 1

## 2016-12-10 NOTE — Progress Notes (Signed)
Patient's Name: Emily [name]  MRN: [id number]  Referring Provider: No ref. provider found  DOB: 02-Aug-1980  PCP: Patient, No Pcp Per  DOS: 12/10/2016  Note by: Gaspar Cola, MD  Service setting: Ambulatory outpatient  Specialty: Interventional Pain Management  Patient type: Established  Location: [facility] (AMB) Pain Management Facility  Visit type: Interventional Procedure   Primary Reason for Visit: Interventional Pain Management Treatment. CC: Back Pain (Mid back)  Procedure:  Anesthesia, Analgesia, Anxiolysis:  Type: Diagnostic, Inter-Laminar, Epidural Steroid Injection Region: Posterior Cervico-thoracic Region Level: C7-T1 Laterality: Right-Sided Paramedial  Type: Local Anesthesia with Moderate (Conscious) Sedation Local Anesthetic: Lidocaine 1% Route: Intravenous (IV) IV Access: Secured Sedation: Meaningful verbal contact was maintained at all times during the procedure  Indication(s): Analgesia and Anxiety   Indications: 1. Cervical radiculitis (Bilateral)   2. 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 Chronic neck pain (Primary Area of Pain) (Bilateral) (R>L)   4. Chronic upper back pain (Primary Area of Pain) (Bilateral) (R>L)   5. Chronic upper extremity pain (Secondary Area of Pain) (Bilateral) (L>R)   6. Cervical radiculitis   7. Spinal stenosis in cervical region   8. Chronic neck pain   9. Chronic upper back pain   10. Chronic pain of both upper extremities    Pain Score: Pre-procedure: 8 /10 Post-procedure: 0-No pain/10  Pre-op Assessment:  Emily [name] is a 36 y.o. (year old), female patient, seen today for interventional treatment. 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 Emily [name] has a current medication list which includes the following prescription(s): albuterol, amlodipine, buspirone, calcium plus d3 absorbable, cholecalciferol, diazepam, diphenhydramine, divalproex, epinephrine, etonogestrel, fingolimod hcl, fluoxetine, ibuprofen, methocarbamol, metoclopramide, multiple vitamins-minerals, onabotulinumtoxina, ranitidine, sulfamethoxazole-trimethoprim, sumatriptan, and topiramate, and the following Facility-Administered Medications: metoCLOPramide (REGLAN) 10 mg in dextrose 5 % 50 mL IVPB, metoclopramide, and midazolam. Her primarily concern today is the Back Pain (Mid back)  Initial Vital Signs: not currently breastfeeding. BMI: Estimated body mass index is 42.05 kg/m as calculated from the following:   Height as of this encounter: 5\' 4"  (1.626 m).   Weight as of this encounter: 245 lb (111.1 kg).  Risk Assessment: Allergies: Reviewed. She is allergic to amitriptyline; baclofen; cymbalta [duloxetine hcl]; gabapentin; monosodium glutamate; other; vicodin [hydrocodone-acetaminophen]; magnesium salicylate; rizatriptan; tizanidine; tramadol; adhesive [tape]; lamotrigine; and toradol [ketorolac tromethamine].  Allergy Precautions: None required Coagulopathies: Reviewed. None identified.  Blood-thinner therapy: None at this time Active Infection(s): Reviewed. None identified. Emily [name] is afebrile  Site Confirmation: Emily [name] was asked to confirm the procedure and laterality before marking the site Procedure checklist: Completed Consent: Before the procedure and under the influence of no sedative(s), amnesic(s), or anxiolytics, the patient was informed of the treatment options, risks and possible complications. To fulfill our ethical and legal obligations, as recommended by the American Medical Association's Code of Ethics, I have informed the patient of my clinical impression; the nature and purpose of the treatment or procedure; the  risks, benefits, and possible complications of the intervention; the alternatives, including doing nothing; the risk(s) and benefit(s) of the alternative treatment(s) or procedure(s); and the risk(s) and benefit(s) of doing nothing. The patient was provided information about the general risks and possible complications associated with the procedure. These may include, but are  not limited to: failure to achieve desired goals, infection, bleeding, organ or nerve damage, allergic reactions, paralysis, and death. In addition, the patient was informed of those risks and complications associated to Spine-related procedures, such as failure to decrease pain; infection (i.e.: Meningitis, epidural or intraspinal abscess); bleeding (i.e.: epidural hematoma, subarachnoid hemorrhage, or any other type of intraspinal or peri-dural bleeding); organ or nerve damage (i.e.: Any type of peripheral nerve, nerve root, or spinal cord injury) with subsequent damage to sensory, motor, and/or autonomic systems, resulting in permanent pain, numbness, and/or weakness of one or several areas of the body; allergic reactions; (i.e.: anaphylactic reaction); and/or death. Furthermore, the patient was informed of those risks and complications associated with the medications. These include, but are not limited to: allergic reactions (i.e.: anaphylactic or anaphylactoid reaction(s)); adrenal axis suppression; blood sugar elevation that in diabetics may result in ketoacidosis or comma; water retention that in patients with history of congestive heart failure may result in shortness of breath, pulmonary edema, and decompensation with resultant heart failure; weight gain; swelling or edema; medication-induced neural toxicity; particulate matter embolism and blood vessel occlusion with resultant organ, and/or nervous system infarction; and/or aseptic necrosis of one or more joints. Finally, the patient was informed that Medicine is not an exact  science; therefore, there is also the possibility of unforeseen or unpredictable risks and/or possible complications that may result in a catastrophic outcome. The patient indicated having understood very clearly. We have given the patient no guarantees and we have made no promises. Enough time was given to the patient to ask questions, all of which were answered to the patient's satisfaction. Ms. Sandra has indicated that she wanted to continue with the procedure. Attestation: I, the ordering provider, attest that I have discussed with the patient the benefits, risks, side-effects, alternatives, likelihood of achieving goals, and potential problems during recovery for the procedure that I have provided informed consent. Date: 12/10/2016; Time: 7:58 AM  Pre-Procedure Preparation:  Monitoring: As per clinic protocol. Respiration, ETCO2, SpO2, BP, heart rate and rhythm monitor placed and checked for adequate function Safety Precautions: Patient was assessed for positional comfort and pressure points before starting the procedure. Time-out: I initiated and conducted the "Time-out" before starting the procedure, as per protocol. The patient was asked to participate by confirming the accuracy of the "Time Out" information. Verification of the correct person, site, and procedure were performed and confirmed by me, the nursing staff, and the patient. "Time-out" conducted as per Joint Commission's Universal Protocol (UP.01.01.01). "Time-out" Date & Time: 12/10/2016; 1508 hrs.  Description of Procedure Process:   Position: Prone with head of the table was raised to facilitate breathing. Target Area: For Epidural Steroid injections the target is the interlaminar space, initially targeting the lower border of the superior vertebral body lamina. Approach: Paramedial approach. Area Prepped: Entire PosteriorCervical Region Prepping solution: ChloraPrep (2% chlorhexidine gluconate and 70% isopropyl alcohol) Safety  Precautions: Aspiration looking for blood return was conducted prior to all injections. At no point did we inject any substances, as a needle was being advanced. No attempts were made at seeking any paresthesias. Safe injection practices and needle disposal techniques used. Medications properly checked for expiration dates. SDV (single dose vial) medications used. Description of the Procedure: Protocol guidelines were followed. The procedure needle was introduced through the skin, ipsilateral to the reported pain, and advanced to the target area. Bone was contacted and the needle walked caudad, until the lamina was cleared. The epidural space was identified using "loss-of-resistance  technique" with 2-3 ml of PF-NaCl (0.9% NSS), in a 5cc LOR glass syringe. Vitals:   12/10/16 1510 12/10/16 1515 12/10/16 1525 12/10/16 1535  BP: (!) 133/117 (!) 147/104 (!) 147/85 (!) 146/82  Pulse:      Resp:  15 16 15   Temp:      SpO2:  97% 97% 98%  Weight:      Height:        Start Time: 1509 hrs. End Time: 1514 hrs. Materials:  Needle(s) Type: Epidural needle Gauge: 17G Length: 3.5-in Medication(s): We administered lactated ringers, midazolam, fentaNYL, iopamidol, dexamethasone, ropivacaine (PF) 2 mg/mL (0.2%), sodium chloride flush, lidocaine, and glycopyrrolate. Please see chart orders for dosing details.  Imaging Guidance (Spinal):  Type of Imaging Technique: Fluoroscopy Guidance (Spinal) Indication(s): Assistance in needle guidance and placement for procedures requiring needle placement in or near specific anatomical locations not easily accessible without such assistance. Exposure Time: Please see nurses notes. Contrast: Before injecting any contrast, we confirmed that the patient did not have an allergy to iodine, shellfish, or radiological contrast. Once satisfactory needle placement was completed at the desired level, radiological contrast was injected. Contrast injected under live fluoroscopy. No  contrast complications. See chart for type and volume of contrast used. Fluoroscopic Guidance: I was personally present during the use of fluoroscopy. "Tunnel Vision Technique" used to obtain the best possible view of the target area. Parallax error corrected before commencing the procedure. "Direction-depth-direction" technique used to introduce the needle under continuous pulsed fluoroscopy. Once target was reached, antero-posterior, oblique, and lateral fluoroscopic projection used confirm needle placement in all planes. Images permanently stored in EMR. Interpretation: I personally interpreted the imaging intraoperatively. Adequate needle placement confirmed in multiple planes. Appropriate spread of contrast into desired area was observed. No evidence of afferent or efferent intravascular uptake. No intrathecal or subarachnoid spread observed. Permanent images saved into the patient's record.  Antibiotic Prophylaxis:  Indication(s): None identified Antibiotic given: None  Post-operative Assessment:  EBL: None Complications: No immediate post-treatment complications observed by team, or reported by patient. Note: The patient tolerated the entire procedure well. A repeat set of vitals were taken after the procedure and the patient was kept under observation following institutional policy, for this type of procedure. Post-procedural neurological assessment was performed, showing return to baseline, prior to discharge. The patient was provided with post-procedure discharge instructions, including a section on how to identify potential problems. Should any problems arise concerning this procedure, the patient was given instructions to immediately contact us, at any time, without hesitation. In any case, we plan to contact the patient by telephone for a follow-up status report regarding this interventional procedure. Comments:  No additional relevant information.  Plan of Care    Imaging Orders      DG C-Arm 1-60 Min-No Report  Procedure Orders     Cervical Epidural Injection     Cervical Epidural Injection  Medications ordered for procedure: Meds ordered this encounter  Medications  . lactated ringers infusion 1,000 mL  . midazolam (VERSED) 5 MG/5ML injection 1-2 mg    Make sure Flumazenil is available in the pyxis when using this medication. If oversedation occurs, administer 0.2 mg IV over 15 sec. If after 45 sec no response, administer 0.2 mg again over 1 min; may repeat at 1 min intervals; not to exceed 4 doses (1 mg)  . fentaNYL (SUBLIMAZE) injection 25-50 mcg    Make sure Narcan is available in the pyxis when using this medication. In the event of  respiratory depression (RR< 8/min): Titrate NARCAN (naloxone) in increments of 0.1 to 0.2 mg IV at 2-3 minute intervals, until desired degree of reversal.  . iopamidol (ISOVUE-M) 41 % intrathecal injection 10 mL  . dexamethasone (DECADRON) injection 10 mg  . ropivacaine (PF) 2 mg/mL (0.2%) (NAROPIN) injection 1 mL  . sodium chloride flush (NS) 0.9 % injection 1 mL  . lidocaine (XYLOCAINE) 2 % (with pres) injection 200 mg  . glycopyrrolate (ROBINUL) injection 0.2 mg   Medications administered: We administered lactated ringers, midazolam, fentaNYL, iopamidol, dexamethasone, ropivacaine (PF) 2 mg/mL (0.2%), sodium chloride flush, lidocaine, and glycopyrrolate.  See the medical record for exact dosing, route, and time of administration.  This SmartLink is deprecated. Use AVSMEDLIST instead to display the medication list for a patient. Disposition: Discharge home  Discharge Date & Time: 12/10/2016; 1537 hrs.   Physician-requested Follow-up: Return in about 2 weeks (around 12/24/2016) for Procedure (no sedation): (R) CESI #3. Future Appointments  Date Time Provider [place]  12/24/2016 10:15 AM Milinda Pointer, MD [facility] None  01/10/2017  1:00 PM Pieter Partridge, DO [facility] None  02/13/2017  1:00 PM Charlcie Cradle, MD  [facility] None  05/12/2017 11:30 AM Cameron Sprang, MD [facility] None   Primary Care Physician: Patient, No Pcp Per Location: [facility] Note by: Gaspar Cola, MD Date: 12/10/2016; Time: 3:56 PM  Disclaimer:  Medicine is not an Chief Strategy Officer. The only guarantee in medicine is that nothing is guaranteed. It is important to note that the decision to proceed with this intervention was based on the information collected from the patient. The Data and conclusions were drawn from the patient's questionnaire, the interview, and the physical examination. Because the information was provided in large part by the patient, it cannot be guaranteed that it has not been purposely or unconsciously manipulated. Every effort has been made to obtain as much relevant data as possible for this evaluation. It is important to note that the conclusions that lead to this procedure are derived in large part from the available data. Always take into account that the treatment will also be dependent on availability of resources and existing treatment guidelines, considered by other Pain Management Practitioners as being common knowledge and practice, at the time of the intervention. For Medico-Legal purposes, it is also important to point out that variation in procedural techniques and pharmacological choices are the acceptable norm. The indications, contraindications, technique, and results of the above procedure should only be interpreted and judged by a Board-Certified Interventional Pain Specialist with extensive familiarity and expertise in the same exact procedure and technique.

## 2016-12-10 NOTE — Patient Instructions (Signed)

## 2016-12-11 ENCOUNTER — Telehealth: Payer: Self-pay | Admitting: Pain Medicine

## 2016-12-11 DIAGNOSIS — M549 Dorsalgia, unspecified: Secondary | ICD-10-CM | POA: Diagnosis not present

## 2016-12-11 DIAGNOSIS — E58 Dietary calcium deficiency: Secondary | ICD-10-CM | POA: Diagnosis not present

## 2016-12-11 DIAGNOSIS — B999 Unspecified infectious disease: Secondary | ICD-10-CM | POA: Diagnosis not present

## 2016-12-11 DIAGNOSIS — I10 Essential (primary) hypertension: Secondary | ICD-10-CM | POA: Diagnosis not present

## 2016-12-11 DIAGNOSIS — R110 Nausea: Secondary | ICD-10-CM | POA: Diagnosis not present

## 2016-12-11 DIAGNOSIS — G43909 Migraine, unspecified, not intractable, without status migrainosus: Secondary | ICD-10-CM | POA: Diagnosis not present

## 2016-12-11 DIAGNOSIS — Z87891 Personal history of nicotine dependence: Secondary | ICD-10-CM | POA: Diagnosis not present

## 2016-12-11 DIAGNOSIS — Z6841 Body Mass Index (BMI) 40.0 and over, adult: Secondary | ICD-10-CM | POA: Diagnosis not present

## 2016-12-11 DIAGNOSIS — R05 Cough: Secondary | ICD-10-CM | POA: Diagnosis not present

## 2016-12-11 DIAGNOSIS — Z299 Encounter for prophylactic measures, unspecified: Secondary | ICD-10-CM | POA: Diagnosis not present

## 2016-12-11 DIAGNOSIS — F419 Anxiety disorder, unspecified: Secondary | ICD-10-CM | POA: Diagnosis not present

## 2016-12-11 DIAGNOSIS — G4700 Insomnia, unspecified: Secondary | ICD-10-CM | POA: Diagnosis not present

## 2016-12-11 NOTE — Telephone Encounter (Signed)
Patient concerned because she feels she did not receive enough sedation with procedure yesterday. Patient advised that she should ask physician or nurse if she feels she needs more sedation.

## 2016-12-11 NOTE — Telephone Encounter (Signed)
Maybe having a reaction to a medication from procedure, more sore than before. Had procedure 12-10-16 Please call

## 2016-12-17 DIAGNOSIS — G40909 Epilepsy, unspecified, not intractable, without status epilepticus: Secondary | ICD-10-CM | POA: Diagnosis not present

## 2016-12-17 DIAGNOSIS — I10 Essential (primary) hypertension: Secondary | ICD-10-CM | POA: Diagnosis not present

## 2016-12-17 DIAGNOSIS — F419 Anxiety disorder, unspecified: Secondary | ICD-10-CM | POA: Diagnosis not present

## 2016-12-17 DIAGNOSIS — F329 Major depressive disorder, single episode, unspecified: Secondary | ICD-10-CM | POA: Diagnosis not present

## 2016-12-17 DIAGNOSIS — Z6839 Body mass index (BMI) 39.0-39.9, adult: Secondary | ICD-10-CM | POA: Diagnosis not present

## 2016-12-17 DIAGNOSIS — K430 Incisional hernia with obstruction, without gangrene: Secondary | ICD-10-CM | POA: Diagnosis not present

## 2016-12-17 DIAGNOSIS — G35 Multiple sclerosis: Secondary | ICD-10-CM | POA: Diagnosis not present

## 2016-12-23 DIAGNOSIS — M79601 Pain in right arm: Secondary | ICD-10-CM | POA: Diagnosis not present

## 2016-12-23 DIAGNOSIS — M542 Cervicalgia: Secondary | ICD-10-CM | POA: Diagnosis not present

## 2016-12-23 DIAGNOSIS — M79602 Pain in left arm: Secondary | ICD-10-CM | POA: Diagnosis not present

## 2016-12-23 DIAGNOSIS — G8929 Other chronic pain: Secondary | ICD-10-CM | POA: Diagnosis not present

## 2016-12-24 ENCOUNTER — Ambulatory Visit
Admission: RE | Admit: 2016-12-24 | Discharge: 2016-12-24 | Disposition: A | Discharge status: Home / Self Care | Payer: Medicare Other | Source: Ambulatory Visit | Attending: Pain Medicine | Admitting: Pain Medicine

## 2016-12-24 ENCOUNTER — Ambulatory Visit (HOSPITAL_BASED_OUTPATIENT_CLINIC_OR_DEPARTMENT_OTHER): Payer: Medicare Other | Admitting: Pain Medicine

## 2016-12-24 ENCOUNTER — Encounter: Payer: Self-pay | Admitting: Pain Medicine

## 2016-12-24 ENCOUNTER — Other Ambulatory Visit: Payer: Self-pay

## 2016-12-24 VITALS — BP 142/68 | HR 74 | Resp 16 | Ht 64.0 in | Wt 250.0 lb

## 2016-12-24 DIAGNOSIS — M5030 Other cervical disc degeneration, unspecified cervical region: Secondary | ICD-10-CM | POA: Diagnosis not present

## 2016-12-24 DIAGNOSIS — M542 Cervicalgia: Secondary | ICD-10-CM

## 2016-12-24 DIAGNOSIS — M549 Dorsalgia, unspecified: Secondary | ICD-10-CM | POA: Diagnosis not present

## 2016-12-24 DIAGNOSIS — G8929 Other chronic pain: Secondary | ICD-10-CM | POA: Diagnosis not present

## 2016-12-24 DIAGNOSIS — M4802 Spinal stenosis, cervical region: Secondary | ICD-10-CM | POA: Insufficient documentation

## 2016-12-24 DIAGNOSIS — M5412 Radiculopathy, cervical region: Secondary | ICD-10-CM | POA: Diagnosis not present

## 2016-12-24 MED ORDER — IOPAMIDOL (ISOVUE-M 200) INJECTION 41%
10.0000 mL | Freq: Once | INTRAMUSCULAR | Status: AC
  Administered 2016-12-24: 10 mL via EPIDURAL
  Filled 2016-12-24: qty 10

## 2016-12-24 MED ORDER — ROPIVACAINE HCL 2 MG/ML IJ SOLN
1.0000 mL | Freq: Once | INTRAMUSCULAR | Status: AC
  Administered 2016-12-24: 10 mL via EPIDURAL
  Filled 2016-12-24: qty 10

## 2016-12-24 MED ORDER — MIDAZOLAM HCL 5 MG/5ML IJ SOLN
1.0000 mg | INTRAMUSCULAR | Status: DC | PRN
  Administered 2016-12-24: 3 mg via INTRAVENOUS
  Filled 2016-12-24: qty 5

## 2016-12-24 MED ORDER — SODIUM CHLORIDE 0.9% FLUSH
1.0000 mL | Freq: Once | INTRAVENOUS | Status: AC
  Administered 2016-12-24: 10 mL

## 2016-12-24 MED ORDER — LACTATED RINGERS IV SOLN
1000.0000 mL | Freq: Once | INTRAVENOUS | Status: AC
  Administered 2016-12-24: 1000 mL via INTRAVENOUS

## 2016-12-24 MED ORDER — DEXAMETHASONE SODIUM PHOSPHATE 10 MG/ML IJ SOLN
10.0000 mg | Freq: Once | INTRAMUSCULAR | Status: AC
  Administered 2016-12-24: 10 mg
  Filled 2016-12-24: qty 1

## 2016-12-24 MED ORDER — LIDOCAINE HCL 2 % IJ SOLN
10.0000 mL | Freq: Once | INTRAMUSCULAR | Status: AC
  Administered 2016-12-24: 400 mg
  Filled 2016-12-24: qty 40

## 2016-12-24 MED ORDER — FENTANYL CITRATE (PF) 100 MCG/2ML IJ SOLN
25.0000 ug | INTRAMUSCULAR | Status: DC | PRN
  Administered 2016-12-24: 100 ug via INTRAVENOUS
  Filled 2016-12-24: qty 2

## 2016-12-24 MED ORDER — SODIUM CHLORIDE 0.9 % IJ SOLN
INTRAMUSCULAR | Status: AC
  Filled 2016-12-24: qty 10

## 2016-12-24 NOTE — Progress Notes (Signed)
Patient's Name: Emily [name]  MRN: [id number]  Referring Provider: No ref. provider found  DOB: Jun 07, 1980  PCP: Patient, No Pcp Per  DOS: 12/24/2016  Note by: Gaspar Cola, MD  Service setting: Ambulatory outpatient  Specialty: Interventional Pain Management  Patient type: Established  Location: [facility] (AMB) Pain Management Facility  Visit type: Interventional Procedure   Primary Reason for Visit: Interventional Pain Management Treatment. CC: Back Pain (upper)  Procedure:  Anesthesia, Analgesia, Anxiolysis:  Type: Diagnostic, Inter-Laminar, Epidural Steroid Injection Region: Posterior Cervico-thoracic Region Level: C7-T1 Laterality: Right-Sided Paramedial  Type: Local Anesthesia with Moderate (Conscious) Sedation Local Anesthetic: Lidocaine 1% Route: Intravenous (IV) IV Access: Secured Sedation: Meaningful verbal contact was maintained at all times during the procedure  Indication(s): Analgesia and Anxiety   Indications: 1. Cervical radiculitis (Bilateral)   2. 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 Chronic neck pain (Primary Area of Pain) (Bilateral) (R>L)   4. DDD (degenerative disc disease), cervical    Pain Score: Pre-procedure: 5 /10 Post-procedure: 0-No pain/10  Pre-op Assessment:  Emily [name] is a 36 y.o. (year old), female patient, seen today for interventional treatment. 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 [name] has a current medication list which includes the following prescription(s): albuterol, amlodipine, buspirone, calcium plus d3 absorbable, cholecalciferol, diazepam, diphenhydramine, divalproex, epinephrine, etonogestrel, fingolimod  hcl, fluoxetine, ibuprofen, methocarbamol, metoclopramide, multiple vitamins-minerals, onabotulinumtoxina, ranitidine, sumatriptan, topiramate, and sulfamethoxazole-trimethoprim, and the following Facility-Administered Medications: fentanyl, lactated ringers, metoCLOPramide (REGLAN) 10 mg in dextrose 5 % 50 mL IVPB, metoclopramide, and midazolam. Her primarily concern today is the Back Pain (upper)  Initial Vital Signs: not currently breastfeeding. BMI: Estimated body mass index is 42.91 kg/m as calculated from the following:   Height as of this encounter: 5\' 4"  (1.626 m).   Weight as of this encounter: 250 lb (113.4 kg).  Risk Assessment: Allergies: Reviewed. She is allergic to amitriptyline; baclofen; cymbalta [duloxetine hcl]; gabapentin; monosodium glutamate; other; vicodin [hydrocodone-acetaminophen]; magnesium salicylate; rizatriptan; tizanidine; tramadol; adhesive [tape]; lamotrigine; and toradol [ketorolac tromethamine].  Allergy Precautions: None required Coagulopathies: Reviewed. None identified.  Blood-thinner therapy: None at this time Active Infection(s): Reviewed. None identified. Emily [name] is afebrile  Site Confirmation: Emily [name] was asked to confirm the procedure and laterality before marking the site Procedure checklist: Completed Consent: Before the procedure and under the influence of no sedative(s), amnesic(s), or anxiolytics, the patient was informed of the treatment options, risks and possible complications. To fulfill our ethical and legal obligations, as recommended by the American Medical Association's Code of Ethics, I have informed the patient of my clinical impression; the nature and purpose of the treatment or procedure; the risks, benefits, and possible complications of the intervention; the alternatives, including doing nothing; the risk(s) and benefit(s) of the alternative treatment(s) or procedure(s); and the risk(s) and benefit(s) of doing nothing. The patient was  provided information about the general risks and possible complications associated with the procedure. These may include, but are not limited to: failure to achieve desired goals, infection, bleeding, organ or nerve damage, allergic reactions, paralysis, and death. In addition, the patient was informed of those risks and complications associated to Spine-related procedures, such as failure to decrease pain; infection (i.e.: Meningitis, epidural or intraspinal abscess); bleeding (i.e.: epidural hematoma, subarachnoid  hemorrhage, or any other type of intraspinal or peri-dural bleeding); organ or nerve damage (i.e.: Any type of peripheral nerve, nerve root, or spinal cord injury) with subsequent damage to sensory, motor, and/or autonomic systems, resulting in permanent pain, numbness, and/or weakness of one or several areas of the body; allergic reactions; (i.e.: anaphylactic reaction); and/or death. Furthermore, the patient was informed of those risks and complications associated with the medications. These include, but are not limited to: allergic reactions (i.e.: anaphylactic or anaphylactoid reaction(s)); adrenal axis suppression; blood sugar elevation that in diabetics may result in ketoacidosis or comma; water retention that in patients with history of congestive heart failure may result in shortness of breath, pulmonary edema, and decompensation with resultant heart failure; weight gain; swelling or edema; medication-induced neural toxicity; particulate matter embolism and blood vessel occlusion with resultant organ, and/or nervous system infarction; and/or aseptic necrosis of one or more joints. Finally, the patient was informed that Medicine is not an exact science; therefore, there is also the possibility of unforeseen or unpredictable risks and/or possible complications that may result in a catastrophic outcome. The patient indicated having understood very clearly. We have given the patient no guarantees  and we have made no promises. Enough time was given to the patient to ask questions, all of which were answered to the patient's satisfaction. Emily [name] has indicated that she wanted to continue with the procedure. Attestation: I, the ordering provider, attest that I have discussed with the patient the benefits, risks, side-effects, alternatives, likelihood of achieving goals, and potential problems during recovery for the procedure that I have provided informed consent. Date: 12/24/2016; Time: 8:18 AM  Pre-Procedure Preparation:  Monitoring: As per clinic protocol. Respiration, ETCO2, SpO2, BP, heart rate and rhythm monitor placed and checked for adequate function Safety Precautions: Patient was assessed for positional comfort and pressure points before starting the procedure. Time-out: I initiated and conducted the "Time-out" before starting the procedure, as per protocol. The patient was asked to participate by confirming the accuracy of the "Time Out" information. Verification of the correct person, site, and procedure were performed and confirmed by me, the nursing staff, and the patient. "Time-out" conducted as per Joint Commission's Universal Protocol (UP.01.01.01). "Time-out" Date & Time: 12/24/2016; 1134 hrs.  Description of Procedure Process:   Position: Prone with head of the table was raised to facilitate breathing. Target Area: For Epidural Steroid injections the target is the interlaminar space, initially targeting the lower border of the superior vertebral body lamina. Approach: Paramedial approach. Area Prepped: Entire PosteriorCervical Region Prepping solution: ChloraPrep (2% chlorhexidine gluconate and 70% isopropyl alcohol) Safety Precautions: Aspiration looking for blood return was conducted prior to all injections. At no point did we inject any substances, as a needle was being advanced. No attempts were made at seeking any paresthesias. Safe injection practices and needle  disposal techniques used. Medications properly checked for expiration dates. SDV (single dose vial) medications used. Description of the Procedure: Protocol guidelines were followed. The procedure needle was introduced through the skin, ipsilateral to the reported pain, and advanced to the target area. Bone was contacted and the needle walked caudad, until the lamina was cleared. The epidural space was identified using "loss-of-resistance technique" with 2-3 ml of PF-NaCl (0.9% NSS), in a 5cc LOR glass syringe. Vitals:   12/24/16 1143 12/24/16 1149 12/24/16 1159 12/24/16 1209  BP: (!) 136/95 (!) 154/88 136/84   Pulse:      Resp: 16 13 14 16   SpO2: 97% 98% 99% 100%  Weight:      Height:        Start Time: 1135 hrs. End Time: 1143 hrs. Materials:  Needle(s) Type: Epidural needle Gauge: 17G Length: 3.5-in Medication(s): We administered lactated ringers, midazolam, fentaNYL, iopamidol, dexamethasone, ropivacaine (PF) 2 mg/mL (0.2%), sodium chloride flush, and lidocaine. Please see chart orders for dosing details.  Imaging Guidance (Spinal):  Type of Imaging Technique: Fluoroscopy Guidance (Spinal) Indication(s): Assistance in needle guidance and placement for procedures requiring needle placement in or near specific anatomical locations not easily accessible without such assistance. Exposure Time: Please see nurses notes. Contrast: Before injecting any contrast, we confirmed that the patient did not have an allergy to iodine, shellfish, or radiological contrast. Once satisfactory needle placement was completed at the desired level, radiological contrast was injected. Contrast injected under live fluoroscopy. No contrast complications. See chart for type and volume of contrast used. Fluoroscopic Guidance: I was personally present during the use of fluoroscopy. "Tunnel Vision Technique" used to obtain the best possible view of the target area. Parallax error corrected before commencing the  procedure. "Direction-depth-direction" technique used to introduce the needle under continuous pulsed fluoroscopy. Once target was reached, antero-posterior, oblique, and lateral fluoroscopic projection used confirm needle placement in all planes. Images permanently stored in EMR. Interpretation: I personally interpreted the imaging intraoperatively. Adequate needle placement confirmed in multiple planes. Appropriate spread of contrast into desired area was observed. No evidence of afferent or efferent intravascular uptake. No intrathecal or subarachnoid spread observed. Permanent images saved into the patient's record.  Antibiotic Prophylaxis:  Indication(s): None identified Antibiotic given: None  Post-operative Assessment:  EBL: None Complications: No immediate post-treatment complications observed by team, or reported by patient. Note: The patient tolerated the entire procedure well. A repeat set of vitals were taken after the procedure and the patient was kept under observation following institutional policy, for this type of procedure. Post-procedural neurological assessment was performed, showing return to baseline, prior to discharge. The patient was provided with post-procedure discharge instructions, including a section on how to identify potential problems. Should any problems arise concerning this procedure, the patient was given instructions to immediately contact us, at any time, without hesitation. In any case, we plan to contact the patient by telephone for a follow-up status report regarding this interventional procedure. Comments:  No additional relevant information.  Plan of Care    Imaging Orders     DG C-Arm 1-60 Min-No Report  Procedure Orders     Cervical Epidural Injection  Medications ordered for procedure: Meds ordered this encounter  Medications  . lactated ringers infusion 1,000 mL  . midazolam (VERSED) 5 MG/5ML injection 1-2 mg    Make sure Flumazenil is  available in the pyxis when using this medication. If oversedation occurs, administer 0.2 mg IV over 15 sec. If after 45 sec no response, administer 0.2 mg again over 1 min; may repeat at 1 min intervals; not to exceed 4 doses (1 mg)  . fentaNYL (SUBLIMAZE) injection 25-50 mcg    Make sure Narcan is available in the pyxis when using this medication. In the event of respiratory depression (RR< 8/min): Titrate NARCAN (naloxone) in increments of 0.1 to 0.2 mg IV at 2-3 minute intervals, until desired degree of reversal.  . iopamidol (ISOVUE-M) 41 % intrathecal injection 10 mL  . dexamethasone (DECADRON) injection 10 mg  . ropivacaine (PF) 2 mg/mL (0.2%) (NAROPIN) injection 1 mL  . sodium chloride flush (NS) 0.9 % injection 1 mL  . lidocaine (XYLOCAINE) 2 % (  with pres) injection 200 mg   Medications administered: We administered lactated ringers, midazolam, fentaNYL, iopamidol, dexamethasone, ropivacaine (PF) 2 mg/mL (0.2%), sodium chloride flush, and lidocaine.  See the medical record for exact dosing, route, and time of administration.  This SmartLink is deprecated. Use AVSMEDLIST instead to display the medication list for a patient. Disposition: Discharge home  Discharge Date & Time: 12/24/2016; 1154 hrs.   Physician-requested Follow-up: Return for post-procedure eval (2 wks). Future Appointments  Date Time Provider [place]  01/10/2017  1:00 PM Pieter Partridge, DO [facility] None  02/13/2017  1:00 PM Charlcie Cradle, MD [facility] None  05/12/2017 11:30 AM Cameron Sprang, MD [facility] None   Primary Care Physician: Patient, No Pcp Per Location: [facility] Note by: Gaspar Cola, MD Date: 12/24/2016; Time: 12:13 PM  Disclaimer:  Medicine is not an Chief Strategy Officer. The only guarantee in medicine is that nothing is guaranteed. It is important to note that the decision to proceed with this intervention was based on the information collected from the patient.  The Data and conclusions were drawn from the patient's questionnaire, the interview, and the physical examination. Because the information was provided in large part by the patient, it cannot be guaranteed that it has not been purposely or unconsciously manipulated. Every effort has been made to obtain as much relevant data as possible for this evaluation. It is important to note that the conclusions that lead to this procedure are derived in large part from the available data. Always take into account that the treatment will also be dependent on availability of resources and existing treatment guidelines, considered by other Pain Management Practitioners as being common knowledge and practice, at the time of the intervention. For Medico-Legal purposes, it is also important to point out that variation in procedural techniques and pharmacological choices are the acceptable norm. The indications, contraindications, technique, and results of the above procedure should only be interpreted and judged by a Board-Certified Interventional Pain Specialist with extensive familiarity and expertise in the same exact procedure and technique.

## 2016-12-24 NOTE — Patient Instructions (Signed)

## 2016-12-24 NOTE — Progress Notes (Signed)
Safety precautions to be maintained throughout the outpatient stay will include: orient to surroundings, keep bed in low position, maintain call bell within reach at all times, provide assistance with transfer out of bed and ambulation.  

## 2016-12-25 ENCOUNTER — Telehealth: Payer: Self-pay | Admitting: *Deleted

## 2016-12-25 NOTE — Telephone Encounter (Signed)
Attempted to call for post procedure follow-up. Message left. 

## 2016-12-27 ENCOUNTER — Ambulatory Visit: Payer: Self-pay | Admitting: Neurology

## 2017-01-03 ENCOUNTER — Telehealth: Payer: Self-pay | Admitting: Pain Medicine

## 2017-01-03 DIAGNOSIS — M79601 Pain in right arm: Secondary | ICD-10-CM | POA: Diagnosis not present

## 2017-01-03 DIAGNOSIS — M542 Cervicalgia: Secondary | ICD-10-CM | POA: Diagnosis not present

## 2017-01-03 DIAGNOSIS — M79602 Pain in left arm: Secondary | ICD-10-CM | POA: Diagnosis not present

## 2017-01-03 DIAGNOSIS — G8929 Other chronic pain: Secondary | ICD-10-CM | POA: Diagnosis not present

## 2017-01-03 NOTE — Telephone Encounter (Signed)
Having a migraine headache now, Dr. Dossie Arbour told her to call when she has one so he can try a treatment. She had an appt on the 14th. Didn't know if he wanted her to come in sooner. I did let her know Dr. Dossie Arbour was out until the 7th. She is supposed to have Botox injection on the 4th.  Should we move her appt up.

## 2017-01-03 NOTE — Telephone Encounter (Signed)
You can offer her a sooner appt if available. If she is having injection on the 4th it will probably be better.

## 2017-01-03 NOTE — Progress Notes (Signed)
Called [facility] [place], talked to [place]. I needed BIN and PCN #'s to initiate PA on cover my meds for Botox. They have [facility] as primary  ID# [id number]   BIN [id number]  PCN [id number]  Standard MCR 2ndry ID# [id number]   BIN [id number]   PCN TELE  MCD tertiary  [id number] Cristine Polio [id number]   PCN [id number]  Entered PA online KEY: [id number]                                PA CaseID# [id number]

## 2017-01-06 ENCOUNTER — Other Ambulatory Visit: Payer: Self-pay | Admitting: Neurology

## 2017-01-08 NOTE — Telephone Encounter (Signed)
Patient lvmail Mon 01-06-17  At 8:18 stating pain is making her skin feel like its on fire.

## 2017-01-08 NOTE — Telephone Encounter (Signed)
Talked with pt she states that she didn't know that Dr Dossie Arbour wouldn't be out. . until Jan 7th. States that she is having burning in skin and hands. She states its something that happens with MS. Instructed to go to ER if needed and could schedule an eariler appt with Dr Dossie Arbour but pt did not  Want to schedule at this time related to phyiscal therapy and other appt. Instructed to call us back if needed.

## 2017-01-08 NOTE — Telephone Encounter (Signed)
Patient does not have appt here until 14th, botox is somewhere else. Please call patient to discuss

## 2017-01-10 ENCOUNTER — Telehealth: Payer: Self-pay | Admitting: Pain Medicine

## 2017-01-10 ENCOUNTER — Ambulatory Visit: Payer: Self-pay | Admitting: Neurology

## 2017-01-10 NOTE — Telephone Encounter (Signed)
Patient lvmail on Thursday 01-09-17 stating her botox has been canceled due to mix up at Neuro office. She wants to know if she can have injection asap, orders are in for Caudal Epid. She had MCR/MCD. Can she be added to sched this week. Dr. Delane Ginger has 15 procedures scheduled Tue and Thu

## 2017-01-13 DIAGNOSIS — Z87891 Personal history of nicotine dependence: Secondary | ICD-10-CM | POA: Diagnosis not present

## 2017-01-13 DIAGNOSIS — G8929 Other chronic pain: Secondary | ICD-10-CM | POA: Diagnosis not present

## 2017-01-13 DIAGNOSIS — M79601 Pain in right arm: Secondary | ICD-10-CM | POA: Diagnosis not present

## 2017-01-13 DIAGNOSIS — I10 Essential (primary) hypertension: Secondary | ICD-10-CM | POA: Diagnosis not present

## 2017-01-13 DIAGNOSIS — Z6841 Body Mass Index (BMI) 40.0 and over, adult: Secondary | ICD-10-CM | POA: Diagnosis not present

## 2017-01-13 DIAGNOSIS — M542 Cervicalgia: Secondary | ICD-10-CM | POA: Diagnosis not present

## 2017-01-13 DIAGNOSIS — Q232 Congenital mitral stenosis: Secondary | ICD-10-CM | POA: Diagnosis not present

## 2017-01-13 DIAGNOSIS — Z299 Encounter for prophylactic measures, unspecified: Secondary | ICD-10-CM | POA: Diagnosis not present

## 2017-01-13 DIAGNOSIS — G43909 Migraine, unspecified, not intractable, without status migrainosus: Secondary | ICD-10-CM | POA: Diagnosis not present

## 2017-01-13 DIAGNOSIS — F419 Anxiety disorder, unspecified: Secondary | ICD-10-CM | POA: Diagnosis not present

## 2017-01-13 DIAGNOSIS — M79602 Pain in left arm: Secondary | ICD-10-CM | POA: Diagnosis not present

## 2017-01-13 NOTE — Telephone Encounter (Signed)
No auth reqd for Medicare

## 2017-01-14 ENCOUNTER — Ambulatory Visit (INDEPENDENT_AMBULATORY_CARE_PROVIDER_SITE_OTHER): Payer: Medicare Other | Admitting: *Deleted

## 2017-01-14 DIAGNOSIS — G43709 Chronic migraine without aura, not intractable, without status migrainosus: Secondary | ICD-10-CM | POA: Diagnosis not present

## 2017-01-14 MED ORDER — METOCLOPRAMIDE HCL 5 MG/ML IJ SOLN
10.0000 mg | Freq: Once | INTRAMUSCULAR | Status: AC
  Administered 2017-01-14: 10 mg via INTRAMUSCULAR

## 2017-01-14 MED ORDER — DIPHENHYDRAMINE HCL 50 MG/ML IJ SOLN
25.0000 mg | Freq: Once | INTRAMUSCULAR | Status: AC
  Administered 2017-01-14: 25 mg via INTRAMUSCULAR

## 2017-01-14 MED ORDER — KETOROLAC TROMETHAMINE 60 MG/2ML IM SOLN
60.0000 mg | Freq: Once | INTRAMUSCULAR | Status: AC
  Administered 2017-01-14: 60 mg via INTRAMUSCULAR

## 2017-01-14 NOTE — Telephone Encounter (Signed)
Scheduled for 01/23/17

## 2017-01-16 ENCOUNTER — Encounter (HOSPITAL_COMMUNITY): Payer: Self-pay | Admitting: Emergency Medicine

## 2017-01-16 ENCOUNTER — Other Ambulatory Visit: Payer: Self-pay

## 2017-01-20 ENCOUNTER — Ambulatory Visit: Payer: Self-pay | Admitting: Pain Medicine

## 2017-01-22 ENCOUNTER — Telehealth: Payer: Self-pay | Admitting: Neurology

## 2017-01-22 ENCOUNTER — Telehealth: Payer: Self-pay | Admitting: *Deleted

## 2017-01-22 NOTE — Telephone Encounter (Signed)
Spoke with pt - she was reporting that she had fallen yesterday evening.  No injury.  Just wanted to keep [place] updated.

## 2017-01-22 NOTE — Telephone Encounter (Signed)
Called patient. She states there was no injury with the fall and she did not seek any medical attention. She has an appointment for procedure tomorrow and can be evaluated by Dr. Dossie Arbour prior to procedure and she needs to discuss with MS doctor.

## 2017-01-22 NOTE — Telephone Encounter (Signed)
Pt left a voicemail message that she wanted a call back ASAP but did not say why

## 2017-01-23 ENCOUNTER — Ambulatory Visit (HOSPITAL_BASED_OUTPATIENT_CLINIC_OR_DEPARTMENT_OTHER): Payer: Medicare Other | Admitting: Pain Medicine

## 2017-01-23 ENCOUNTER — Encounter: Payer: Self-pay | Admitting: Pain Medicine

## 2017-01-23 ENCOUNTER — Other Ambulatory Visit: Payer: Self-pay

## 2017-01-23 ENCOUNTER — Ambulatory Visit
Admission: RE | Admit: 2017-01-23 | Discharge: 2017-01-23 | Disposition: A | Discharge status: Home / Self Care | Payer: Medicare Other | Source: Ambulatory Visit | Attending: Pain Medicine | Admitting: Pain Medicine

## 2017-01-23 VITALS — BP 154/86 | HR 82 | Temp 98.4°F | Resp 20

## 2017-01-23 DIAGNOSIS — M79641 Pain in right hand: Secondary | ICD-10-CM

## 2017-01-23 DIAGNOSIS — M79642 Pain in left hand: Secondary | ICD-10-CM

## 2017-01-23 DIAGNOSIS — M792 Neuralgia and neuritis, unspecified: Secondary | ICD-10-CM

## 2017-01-23 DIAGNOSIS — G8929 Other chronic pain: Secondary | ICD-10-CM | POA: Insufficient documentation

## 2017-01-23 DIAGNOSIS — G379 Demyelinating disease of central nervous system, unspecified: Secondary | ICD-10-CM

## 2017-01-23 DIAGNOSIS — M4802 Spinal stenosis, cervical region: Secondary | ICD-10-CM

## 2017-01-23 DIAGNOSIS — M5412 Radiculopathy, cervical region: Secondary | ICD-10-CM

## 2017-01-23 MED ORDER — FENTANYL CITRATE (PF) 100 MCG/2ML IJ SOLN
25.0000 ug | INTRAMUSCULAR | Status: DC | PRN
  Administered 2017-01-23: 100 ug via INTRAVENOUS

## 2017-01-23 MED ORDER — LACTATED RINGERS IV SOLN
1000.0000 mL | Freq: Once | INTRAVENOUS | Status: AC
  Administered 2017-01-23: 1000 mL via INTRAVENOUS

## 2017-01-23 MED ORDER — SODIUM CHLORIDE 0.9 % IJ SOLN
INTRAMUSCULAR | Status: AC
  Filled 2017-01-23: qty 10

## 2017-01-23 MED ORDER — MIDAZOLAM HCL 5 MG/5ML IJ SOLN
1.0000 mg | INTRAMUSCULAR | Status: DC | PRN
  Administered 2017-01-23: 4 mg via INTRAVENOUS
  Filled 2017-01-23: qty 5

## 2017-01-23 MED ORDER — FENTANYL CITRATE (PF) 100 MCG/2ML IJ SOLN
INTRAMUSCULAR | Status: AC
  Filled 2017-01-23: qty 2

## 2017-01-23 MED ORDER — SODIUM CHLORIDE 0.9% FLUSH
1.0000 mL | Freq: Once | INTRAVENOUS | Status: AC
Start: 2017-01-23 — End: 2017-01-23
  Administered 2017-01-23: 10 mL

## 2017-01-23 MED ORDER — LIDOCAINE HCL 2 % IJ SOLN
INTRAMUSCULAR | Status: AC
  Filled 2017-01-23: qty 20

## 2017-01-23 MED ORDER — GABAPENTIN 300 MG PO CAPS: 300.0000 mg | ORAL_CAPSULE | Freq: Every day | ORAL | 0 refills | Status: DC

## 2017-01-23 MED ORDER — PREGABALIN 50 MG PO CAPS: 50.0000 mg | ORAL_CAPSULE | Freq: Three times a day (TID) | ORAL | 0 refills | Status: DC

## 2017-01-23 MED ORDER — ROPIVACAINE HCL 2 MG/ML IJ SOLN
1.0000 mL | Freq: Once | INTRAMUSCULAR | Status: AC
  Administered 2017-01-23: 10 mL via EPIDURAL
  Filled 2017-01-23: qty 10

## 2017-01-23 MED ORDER — LIDOCAINE HCL 2 % IJ SOLN
10.0000 mL | Freq: Once | INTRAMUSCULAR | Status: AC
Start: 2017-01-23 — End: 2017-01-23
  Administered 2017-01-23: 400 mg
  Filled 2017-01-23: qty 40

## 2017-01-23 MED ORDER — IOPAMIDOL (ISOVUE-M 200) INJECTION 41%
10.0000 mL | Freq: Once | INTRAMUSCULAR | Status: AC
  Administered 2017-01-23: 10 mL via EPIDURAL
  Filled 2017-01-23: qty 10

## 2017-01-23 MED ORDER — DEXAMETHASONE SODIUM PHOSPHATE 10 MG/ML IJ SOLN
10.0000 mg | Freq: Once | INTRAMUSCULAR | Status: AC
  Administered 2017-01-23: 10 mg
  Filled 2017-01-23: qty 1

## 2017-01-23 NOTE — Patient Instructions (Addendum)

## 2017-01-23 NOTE — Progress Notes (Signed)
Patient's Name: Emily [name]  MRN: [id number]  Referring Provider: No ref. provider found  DOB: 04-Apr-1980  PCP: Monico Blitz, MD  DOS: 01/23/2017  Note by: Gaspar Cola, MD  Service setting: Ambulatory outpatient  Specialty: Interventional Pain Management  Patient type: Established  Location: [facility] (AMB) Pain Management Facility  Visit type: Interventional Procedure   Primary Reason for Visit: Interventional Pain Management Treatment. CC: Neck Pain  Procedure:  Anesthesia, Analgesia, Anxiolysis:  Type: Diagnostic, Inter-Laminar, Epidural Steroid Injection #4  Region: Posterior Cervico-thoracic Region Level: C7-T1 Laterality: Left-Sided Paramedial  Type: Local Anesthesia with Moderate (Conscious) Sedation Local Anesthetic: Lidocaine 1% Route: Intravenous (IV) IV Access: Secured Sedation: Meaningful verbal contact was maintained at all times during the procedure  Indication(s): Analgesia and Anxiety   Indications: 1. Cervical radiculitis (Bilateral)   2. 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 Chronic CNS demyelinating disease (MS)   5. Neurogenic pain   6. Cervical radiculitis    Pain Score: Pre-procedure: 10-Worst pain ever/10 Post-procedure: 0-No pain/10  Pre-op Assessment:  Emily [name] is a 37 y.o. (year old), female patient, seen today for interventional treatment. 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 Emily [name] has a current medication list which includes the following prescription(s): albuterol, amlodipine, buspirone, calcium plus d3 absorbable, cholecalciferol, diazepam,  diphenhydramine, divalproex, epinephrine, etonogestrel, fluoxetine, gilenya, ibuprofen, loratadine, methocarbamol, metoclopramide, multiple vitamins-minerals, onabotulinumtoxina, ranitidine, sumatriptan, topiramate, and pregabalin, and the following Facility-Administered Medications: fentanyl, metoCLOPramide (REGLAN) 10 mg in dextrose 5 % 50 mL IVPB, metoclopramide, and midazolam. Her primarily concern today is the Neck Pain  Initial Vital Signs: not currently breastfeeding. BMI: Estimated body mass index is 42.91 kg/m as calculated from the following:   Height as of 12/24/16: 5\' 4"  (1.626 m).   Weight as of 12/24/16: 250 lb (113.4 kg).  Risk Assessment: Allergies: Reviewed. She is allergic to amitriptyline; baclofen; cymbalta [duloxetine hcl]; gabapentin; monosodium glutamate; other; vicodin [hydrocodone-acetaminophen]; magnesium salicylate; rizatriptan; tizanidine; tramadol; adhesive [tape]; lamotrigine; and toradol [ketorolac tromethamine].  Allergy Precautions: None required Coagulopathies: Reviewed. None identified.  Blood-thinner therapy: None at this time Active Infection(s): Reviewed. None identified. Emily [name] is afebrile  Site Confirmation: Emily [name] was asked to confirm the procedure and laterality before marking the site Procedure checklist: Completed Consent: Before the procedure and under the influence of no sedative(s), amnesic(s), or anxiolytics, the patient was informed of the treatment options, risks and possible complications. To fulfill our ethical and legal obligations, as recommended by the American Medical Association's Code of Ethics, I have informed the patient of my clinical impression; the nature and purpose of the treatment or procedure; the risks, benefits, and possible complications of the intervention; the alternatives, including doing nothing; the risk(s) and benefit(s) of the alternative treatment(s) or procedure(s); and the risk(s) and benefit(s) of doing  nothing. The patient was provided information about the general risks and possible complications associated with the procedure. These may include, but are not limited to: failure to achieve desired goals, infection, bleeding, organ or nerve damage, allergic reactions, paralysis, and death. In addition, the patient was informed of those risks and complications associated to Spine-related procedures, such as failure to decrease pain; infection (i.e.: Meningitis, epidural or intraspinal  abscess); bleeding (i.e.: epidural hematoma, subarachnoid hemorrhage, or any other type of intraspinal or peri-dural bleeding); organ or nerve damage (i.e.: Any type of peripheral nerve, nerve root, or spinal cord injury) with subsequent damage to sensory, motor, and/or autonomic systems, resulting in permanent pain, numbness, and/or weakness of one or several areas of the body; allergic reactions; (i.e.: anaphylactic reaction); and/or death. Furthermore, the patient was informed of those risks and complications associated with the medications. These include, but are not limited to: allergic reactions (i.e.: anaphylactic or anaphylactoid reaction(s)); adrenal axis suppression; blood sugar elevation that in diabetics may result in ketoacidosis or comma; water retention that in patients with history of congestive heart failure may result in shortness of breath, pulmonary edema, and decompensation with resultant heart failure; weight gain; swelling or edema; medication-induced neural toxicity; particulate matter embolism and blood vessel occlusion with resultant organ, and/or nervous system infarction; and/or aseptic necrosis of one or more joints. Finally, the patient was informed that Medicine is not an exact science; therefore, there is also the possibility of unforeseen or unpredictable risks and/or possible complications that may result in a catastrophic outcome. The patient indicated having understood very clearly. We have given  the patient no guarantees and we have made no promises. Enough time was given to the patient to ask questions, all of which were answered to the patient's satisfaction. Emily [name] has indicated that she wanted to continue with the procedure. Attestation: I, the ordering provider, attest that I have discussed with the patient the benefits, risks, side-effects, alternatives, likelihood of achieving goals, and potential problems during recovery for the procedure that I have provided informed consent. Date: 01/23/2017; Time: 7:16 AM  Pre-Procedure Preparation:  Monitoring: As per clinic protocol. Respiration, ETCO2, SpO2, BP, heart rate and rhythm monitor placed and checked for adequate function Safety Precautions: Patient was assessed for positional comfort and pressure points before starting the procedure. Time-out: I initiated and conducted the "Time-out" before starting the procedure, as per protocol. The patient was asked to participate by confirming the accuracy of the "Time Out" information. Verification of the correct person, site, and procedure were performed and confirmed by me, the nursing staff, and the patient. "Time-out" conducted as per Joint Commission's Universal Protocol (UP.01.01.01). "Time-out" Date & Time: 01/23/2017; 1047 hrs.  Description of Procedure Process:   Position: Prone with head of the table was raised to facilitate breathing. Target Area: For Epidural Steroid injections the target is the interlaminar space, initially targeting the lower border of the superior vertebral body lamina. Approach: Paramedial approach. Area Prepped: Entire PosteriorCervical Region Prepping solution: ChloraPrep (2% chlorhexidine gluconate and 70% isopropyl alcohol) Safety Precautions: Aspiration looking for blood return was conducted prior to all injections. At no point did we inject any substances, as a needle was being advanced. No attempts were made at seeking any paresthesias. Safe injection  practices and needle disposal techniques used. Medications properly checked for expiration dates. SDV (single dose vial) medications used. Description of the Procedure: Protocol guidelines were followed. The procedure needle was introduced through the skin, ipsilateral to the reported pain, and advanced to the target area. Bone was contacted and the needle walked caudad, until the lamina was cleared. The epidural space was identified using "loss-of-resistance technique" with 2-3 ml of PF-NaCl (0.9% NSS), in a 5cc LOR glass syringe. Vitals:   01/23/17 1109 01/23/17 1119 01/23/17 1129 01/23/17 1139  BP: (!) 157/81 (!) 159/108 (!) 151/100 (!) 154/86  Pulse:      Resp: 14 18 14  20  Temp: 98.7 F (37.1 C)  98.4 F (36.9 C)   SpO2: 99% 99% 98% 99%    Start Time: 1048 hrs. End Time: 1058 hrs. Materials:  Needle(s) Type: Epidural needle Gauge: 17G Length: 3.5-in Medication(s): We administered midazolam, fentaNYL, lactated ringers, iopamidol, lidocaine, sodium chloride flush, ropivacaine (PF) 2 mg/mL (0.2%), and dexamethasone. Please see chart orders for dosing details.  Imaging Guidance (Spinal):  Type of Imaging Technique: Fluoroscopy Guidance (Spinal) Indication(s): Assistance in needle guidance and placement for procedures requiring needle placement in or near specific anatomical locations not easily accessible without such assistance. Exposure Time: Please see nurses notes. Contrast: Before injecting any contrast, we confirmed that the patient did not have an allergy to iodine, shellfish, or radiological contrast. Once satisfactory needle placement was completed at the desired level, radiological contrast was injected. Contrast injected under live fluoroscopy. No contrast complications. See chart for type and volume of contrast used. Fluoroscopic Guidance: I was personally present during the use of fluoroscopy. "Tunnel Vision Technique" used to obtain the best possible view of the target area.  Parallax error corrected before commencing the procedure. "Direction-depth-direction" technique used to introduce the needle under continuous pulsed fluoroscopy. Once target was reached, antero-posterior, oblique, and lateral fluoroscopic projection used confirm needle placement in all planes. Images permanently stored in EMR. Interpretation: I personally interpreted the imaging intraoperatively. Adequate needle placement confirmed in multiple planes. Appropriate spread of contrast into desired area was observed. No evidence of afferent or efferent intravascular uptake. No intrathecal or subarachnoid spread observed. Permanent images saved into the patient's record.  Antibiotic Prophylaxis:  Indication(s): None identified Antibiotic given: None  Post-operative Assessment:  EBL: None Complications: No immediate post-treatment complications observed by team, or reported by patient. Note: The patient tolerated the entire procedure well. A repeat set of vitals were taken after the procedure and the patient was kept under observation following institutional policy, for this type of procedure. Post-procedural neurological assessment was performed, showing return to baseline, prior to discharge. The patient was provided with post-procedure discharge instructions, including a section on how to identify potential problems. Should any problems arise concerning this procedure, the patient was given instructions to immediately contact us, at any time, without hesitation. In any case, we plan to contact the patient by telephone for a follow-up status report regarding this interventional procedure. Comments:  No additional relevant information.  Plan of Care    Imaging Orders     DG C-Arm 1-60 Min-No Report  Procedure Orders     Cervical Epidural Injection  Medications ordered for procedure: Meds ordered this encounter  Medications  . midazolam (VERSED) 5 MG/5ML injection 1-2 mg    Make sure Flumazenil  is available in the pyxis when using this medication. If oversedation occurs, administer 0.2 mg IV over 15 sec. If after 45 sec no response, administer 0.2 mg again over 1 min; may repeat at 1 min intervals; not to exceed 4 doses (1 mg)  . fentaNYL (SUBLIMAZE) injection 25-50 mcg    Make sure Narcan is available in the pyxis when using this medication. In the event of respiratory depression (RR< 8/min): Titrate NARCAN (naloxone) in increments of 0.1 to 0.2 mg IV at 2-3 minute intervals, until desired degree of reversal.  . lactated ringers infusion 1,000 mL  . iopamidol (ISOVUE-M) 41 % intrathecal injection 10 mL    Must be Myelogram-compatible. If not available, you may substitute with a water-soluble, non-ionic, hypoallergenic, myelogram-compatible radiological contrast medium.  Marland Kitchen lidocaine (XYLOCAINE) 2 % (  with pres) injection 200 mg  . sodium chloride flush (NS) 0.9 % injection 1 mL  . ropivacaine (PF) 2 mg/mL (0.2%) (NAROPIN) injection 1 mL  . dexamethasone (DECADRON) injection 10 mg  . pregabalin (LYRICA) 50 MG capsule    Sig: Take 1 capsule (50 mg total) by mouth 3 (three) times daily.    Dispense:  90 capsule    Refill:  0    Do not place this medication, or any other prescription from our practice, on "Automatic Refill". Patient may have prescription filled one day early if pharmacy is closed on scheduled refill date.   Medications administered: We administered midazolam, fentaNYL, lactated ringers, iopamidol, lidocaine, sodium chloride flush, ropivacaine (PF) 2 mg/mL (0.2%), and dexamethasone.  See the medical record for exact dosing, route, and time of administration.  New Prescriptions   PREGABALIN (LYRICA) 50 MG CAPSULE    Take 1 capsule (50 mg total) by mouth 3 (three) times daily.   Disposition: Discharge home  Discharge Date & Time: 01/23/2017; 1141 hrs.   Physician-requested Follow-up: Return for post-procedure eval (2 wks), w/ Dr. Dossie Arbour.  Future Appointments  Date  Time Provider [place]  02/10/2017  1:30 PM Milinda Pointer, MD [facility] None  02/13/2017  1:00 PM Charlcie Cradle, MD [facility] None  05/12/2017 11:30 AM Cameron Sprang, MD [facility] None   Primary Care Physician: Monico Blitz, MD Location: [facility] Note by: Gaspar Cola, MD Date: 01/23/2017; Time: 12:13 PM  Disclaimer:  Medicine is not an Chief Strategy Officer. The only guarantee in medicine is that nothing is guaranteed. It is important to note that the decision to proceed with this intervention was based on the information collected from the patient. The Data and conclusions were drawn from the patient's questionnaire, the interview, and the physical examination. Because the information was provided in large part by the patient, it cannot be guaranteed that it has not been purposely or unconsciously manipulated. Every effort has been made to obtain as much relevant data as possible for this evaluation. It is important to note that the conclusions that lead to this procedure are derived in large part from the available data. Always take into account that the treatment will also be dependent on availability of resources and existing treatment guidelines, considered by other Pain Management Practitioners as being common knowledge and practice, at the time of the intervention. For Medico-Legal purposes, it is also important to point out that variation in procedural techniques and pharmacological choices are the acceptable norm. The indications, contraindications, technique, and results of the above procedure should only be interpreted and judged by a Board-Certified Interventional Pain Specialist with extensive familiarity and expertise in the same exact procedure and technique.

## 2017-01-24 ENCOUNTER — Telehealth: Payer: Self-pay | Admitting: *Deleted

## 2017-01-24 ENCOUNTER — Other Ambulatory Visit: Payer: Self-pay | Admitting: Pain Medicine

## 2017-01-24 DIAGNOSIS — E559 Vitamin D deficiency, unspecified: Secondary | ICD-10-CM

## 2017-01-24 NOTE — Telephone Encounter (Signed)
Voicemail left for patient to please call our office if there are any questions or concerns re; procedure on yesterday.

## 2017-01-24 NOTE — Telephone Encounter (Signed)
Patient returned my post procedure phone call to let me know that her headache started back about 1700 on yesterday.  States that it is not as bad as previously, she will let us know if anything changes or it worsens. I did tell patient that she has the option of going to ED if pain becomes severe.  Everything that was described, nausea, vomitting, sweating and feeling clammy have typically accompanied her headaches and are not new s/s.  States she is eating and drinking normally.

## 2017-01-29 ENCOUNTER — Ambulatory Visit (HOSPITAL_COMMUNITY): Payer: Medicare Other | Admitting: Certified Registered Nurse Anesthetist

## 2017-01-29 ENCOUNTER — Other Ambulatory Visit: Payer: Self-pay

## 2017-01-29 ENCOUNTER — Encounter (HOSPITAL_COMMUNITY): Payer: Self-pay

## 2017-01-29 ENCOUNTER — Ambulatory Visit (HOSPITAL_COMMUNITY)
Admission: RE | Admit: 2017-01-29 | Discharge: 2017-01-29 | Disposition: A | Discharge status: Home / Self Care | Payer: Medicare Other | Source: Ambulatory Visit | Attending: Surgery | Admitting: Surgery

## 2017-01-29 ENCOUNTER — Encounter (HOSPITAL_COMMUNITY)
Admission: RE | Disposition: A | Discharge status: Home / Self Care | Payer: Self-pay | Source: Ambulatory Visit | Attending: Surgery

## 2017-01-29 DIAGNOSIS — K219 Gastro-esophageal reflux disease without esophagitis: Secondary | ICD-10-CM | POA: Insufficient documentation

## 2017-01-29 DIAGNOSIS — K5900 Constipation, unspecified: Secondary | ICD-10-CM | POA: Diagnosis not present

## 2017-01-29 DIAGNOSIS — K5732 Diverticulitis of large intestine without perforation or abscess without bleeding: Secondary | ICD-10-CM | POA: Diagnosis not present

## 2017-01-29 DIAGNOSIS — K649 Unspecified hemorrhoids: Secondary | ICD-10-CM | POA: Diagnosis not present

## 2017-01-29 DIAGNOSIS — Z79899 Other long term (current) drug therapy: Secondary | ICD-10-CM | POA: Insufficient documentation

## 2017-01-29 DIAGNOSIS — Z885 Allergy status to narcotic agent status: Secondary | ICD-10-CM | POA: Insufficient documentation

## 2017-01-29 DIAGNOSIS — R51 Headache: Secondary | ICD-10-CM | POA: Insufficient documentation

## 2017-01-29 DIAGNOSIS — F419 Anxiety disorder, unspecified: Secondary | ICD-10-CM | POA: Diagnosis not present

## 2017-01-29 DIAGNOSIS — K644 Residual hemorrhoidal skin tags: Secondary | ICD-10-CM | POA: Diagnosis not present

## 2017-01-29 DIAGNOSIS — E669 Obesity, unspecified: Secondary | ICD-10-CM | POA: Diagnosis not present

## 2017-01-29 DIAGNOSIS — I10 Essential (primary) hypertension: Secondary | ICD-10-CM | POA: Diagnosis not present

## 2017-01-29 DIAGNOSIS — G35 Multiple sclerosis: Secondary | ICD-10-CM | POA: Diagnosis not present

## 2017-01-29 DIAGNOSIS — Z87891 Personal history of nicotine dependence: Secondary | ICD-10-CM | POA: Diagnosis not present

## 2017-01-29 DIAGNOSIS — K5730 Diverticulosis of large intestine without perforation or abscess without bleeding: Secondary | ICD-10-CM | POA: Diagnosis not present

## 2017-01-29 DIAGNOSIS — K921 Melena: Secondary | ICD-10-CM | POA: Insufficient documentation

## 2017-01-29 DIAGNOSIS — F329 Major depressive disorder, single episode, unspecified: Secondary | ICD-10-CM | POA: Diagnosis not present

## 2017-01-29 DIAGNOSIS — K625 Hemorrhage of anus and rectum: Secondary | ICD-10-CM | POA: Diagnosis not present

## 2017-01-29 HISTORY — PX: COLONOSCOPY WITH PROPOFOL: SHX5780

## 2017-01-29 SURGERY — COLONOSCOPY WITH PROPOFOL
Anesthesia: Monitor Anesthesia Care

## 2017-01-29 MED ORDER — SODIUM CHLORIDE 0.9 % IV SOLN: INTRAVENOUS | Status: DC

## 2017-01-29 MED ORDER — PROPOFOL 500 MG/50ML IV EMUL
INTRAVENOUS | Status: DC | PRN
  Administered 2017-01-29: 125 ug/kg/min via INTRAVENOUS

## 2017-01-29 MED ORDER — LACTATED RINGERS IV SOLN
INTRAVENOUS | Status: DC | PRN
  Administered 2017-01-29: 11:00:00 via INTRAVENOUS

## 2017-01-29 MED ORDER — LIDOCAINE 2% (20 MG/ML) 5 ML SYRINGE
INTRAMUSCULAR | Status: DC | PRN
  Administered 2017-01-29: 100 mg via INTRAVENOUS

## 2017-01-29 MED ORDER — ONDANSETRON HCL 4 MG/2ML IJ SOLN
INTRAMUSCULAR | Status: DC | PRN
  Administered 2017-01-29: 4 mg via INTRAVENOUS

## 2017-01-29 MED ORDER — PROPOFOL 10 MG/ML IV BOLUS
INTRAVENOUS | Status: AC
  Filled 2017-01-29: qty 60

## 2017-01-29 MED ORDER — PROPOFOL 10 MG/ML IV BOLUS
INTRAVENOUS | Status: DC | PRN
  Administered 2017-01-29 (×2): 30 mg via INTRAVENOUS
  Administered 2017-01-29: 20 mg via INTRAVENOUS
  Administered 2017-01-29: 30 mg via INTRAVENOUS
  Administered 2017-01-29: 20 mg via INTRAVENOUS

## 2017-01-29 NOTE — H&P (Signed)
CC: Here for colonoscopy  HPI: Emily [name] with hx of multiple sclerosis presents for evaluation of possible hemorrhoids. She has a 40yr hx of anal tissue prolapse which hasn't been that bothersome.Notes anal canal tissue prolapse with BMs that occasionally requires manual reduction but often reduces on it's own. OCcasional bright red blood per rectum. Was seen 10/21/16 by Dr. Sallyanne Havers for a thrombosed external hemorrhoid and extreme pain - he lanced. She had siginificant improvement in her pain. Denies f/c. Denies daily fiber supplementation nor laxatives. Has BM ever 2-3 days, intermittent constipation. Spends 10-15 minutes on the commode with each BM. Last colonoscopy 2008 at time of stoma reversal; done by Dr. Tacy Dura - she reports it was normal  PMH: Multiple sclerosis, diverticulitis (?)  PSH: C-sx x1; multiple laparotomies and 2 prior ventral hernia repairs; Hartmann's + subsequent reversal ~2008  FHx: Maternal grandmother had colon ca at unknown age; mother has hx of multiple prior colon polyps - first found in her early 55s  Social: Denies use of tobacco/EtOH/drug use  ROS: A comprehensive 10 system review of systems was completed and pertinent positives as noted above.  Allergies Amitriptyline HCl *ANTIDEPRESSANTS*   hypertension Baclofen *DERMATOLOGICALS*   Hives, Shortness of breath. Cymbalta *ANTIDEPRESSANTS*   Shortness of breath, Rash. Gabapentin *ANTICONVULSANTS*   Shortness of breath, Rash. MONOSODIUM GLUTAMATE   Anaphylaxis. Vicodin *ANALGESICS - OPIOID*   Hives, Nausea, Vomiting. ALPRAZolam *ANTIANXIETY AGENTS*   Magnesium Salicylate *CHEMICALS*   Hives, Itching. Rizatriptan Benzoate *MIGRAINE PRODUCTS*   Nausea, Vomiting. Tizanidine Comfort Pac *MUSCULOSKELETAL THERAPY AGENTS*   Hives. TraMADol HCl *ANALGESICS - OPIOID*   Adhesive 1"x6yd *MEDICAL DEVICES AND SUPPLIES*   LamoTRIgine *ANTICONVULSANTS*   Rash. Toradol *ANALGESICS - ANTI-INFLAMMATORY*   Nausea. Allergies  Reconciled    Medication History Divalproex Sodium (Migraine)  (500MG  Tablet ER 24HR, Oral at bedtime) Active. Topiramate  (50MG  Tablet, Oral two times daily) Active. SUMAtriptan Succinate  (100MG  Tablet, Oral as needed) Active. AmLODIPine Besylate  (5MG  Tablet, Oral every morning) Active. Methocarbamol  (500MG  Tablet, Oral two times daily as needed) Active. BusPIRone HCl  (15MG  Tablet, Oral three times daily) Active. piazepam  (5mg  daily) Active. FLUoxetine HCl  (60MG  Tablet, Oral daily) Active. HydroCHLOROthiazide  (25MG  Tablet, Oral daily) Active. Metoclopramide HCl  (10MG  Tablet, Oral as needed) Active. Albuterol Sulfate HFA  (108 (90 Base)MCG/ACT Aerosol Soln, Inhalation) Active. epi pen   (0.3mg ) Active. RaNITidine HCl  (300MG  Tablet, Oral at bedtime) Active. Vitamin D (Ergocalciferol)  (50000UNIT Capsule, Oral) Active. [employer] D3  (5000UNIT Capsule, Oral) Active. DiazePAM  (5MG  Tablet, Oral) Active. Amoxicillin  (500MG  Capsule, Oral) Active. Hydrocodone-Acetaminophen  (5-325MG  Tablet, Oral) Active. Divalproex Sodium  (500MG  Tablet DR, Oral) Active. Gilenya  (0.5MG  Capsule, Oral) Active. Methocarbamol  (750MG  Tablet, Oral) Active. Ibuprofen  (800MG  Tablet, Oral) Active. Medications Reconciled     Review of Systems General Present- Chills, Fatigue, Fever and Night Sweats. Not Present- Appetite Loss, Weight Gain and Weight Loss. Skin Not Present- Change in Wart/Mole, Dryness, Hives, Jaundice, New Lesions, Non-Healing Wounds, Rash and Ulcer. HEENT Present- Seasonal Allergies and Wears glasses/contact lenses. Not Present- Earache, Hearing Loss, Hoarseness, Nose Bleed, Oral Ulcers, Ringing in the Ears, Sinus Pain, Sore Throat, Visual Disturbances and Yellow Eyes. Respiratory Not Present- Bloody sputum, Chronic Cough, Difficulty Breathing, Snoring and Wheezing. Breast Not Present- Breast Mass, Breast Pain, Nipple Discharge and Skin Changes. Cardiovascular Not Present- Chest Pain,  Difficulty Breathing Lying Down, Leg Cramps, Palpitations, Rapid Heart Rate, Shortness of Breath and Swelling of Extremities. Gastrointestinal Present- Abdominal  Pain, Difficulty Swallowing, Hemorrhoids, Nausea, Rectal Pain and Vomiting. Not Present- Bloating, Bloody Stool, Change in Bowel Habits, Chronic diarrhea, Constipation, Excessive gas, Gets full quickly at meals and Indigestion. Female Genitourinary Present- Frequency and Urgency. Not Present- Nocturia, Painful Urination and Pelvic Pain. Musculoskeletal Present- Back Pain. Not Present- Joint Pain, Joint Stiffness, Muscle Pain, Muscle Weakness and Swelling of Extremities. Neurological Present- Fainting, Headaches, Numbness, Seizures, Tingling, Trouble walking and Weakness. Not Present- Decreased Memory and Tremor. Endocrine Not Present- Cold Intolerance, Excessive Hunger, Hair Changes, Heat Intolerance, Hot flashes and New Diabetes. Hematology Not Present- Blood Thinners, Easy Bruising, Excessive bleeding, Gland problems, HIV and Persistent Infections.  Physical Exam Vitals:   01/29/17 1044  BP: (!) 153/104  Pulse: 84  Resp: 11  SpO2: 100%   Constitutional: NAD, conversant, no deformities Eyes: Moist conjunctiva, no lid lag, anicteric, PERRL Neck: Trachea midline; no thyromegaly Lungs: Normal respiratory effort, no tactile fremitus CV: RRR, no palpable thrills, no pitting edema GI: Abdomen soft, NT; no palpable hepatosplenomegaly Anorectal: External skin tags, no appreciable external hemorrhoids or prolapsed hemorrhoids; DRE - fullness left lateral; anoscopy - left lateral internal hemorrhoid. No other significant findings MSK: Normal gait; no clubbing/cyanosis Psych: appropriate affect; alert and oriented x3 Lymphatic: No palpable cervical or axillary lymphadenopathy  A/P 72F with bright red blood per rectum, hx of MS - found to have mixed internal/external - TEH has since resolved.  -Plan diagnostic colonoscopy today - the  planned procedure, material risks (including but not limited to bleeding, perforation of colon, need for additional procedures) benefits and alternatives were discussed at length with the patient. Her questions were answered to her satisfaction and she elected to proceed.  Sharon Mt. Dema Severin, M.D. [facility]

## 2017-01-29 NOTE — Op Note (Signed)
[facility] Patient Name: Emily [name] Procedure Date: 01/29/2017 MRN: [id number] Attending MD: Ileana Roup MD, MD Date of Birth: [birth year] CSN: [id number] Age: 37 Admit Type: Inpatient Procedure:                Colonoscopy Indications:              Rectal bleeding Providers:                Sharon Mt. Edoardo Laforte MD, MD, Cleda Daub, RN,                            Alan Mulder, Technician, Adair Laundry, CRNA Referring MD:              Medicines:                Monitored Anesthesia Care Complications:            No immediate complications. Estimated Blood Loss:     Estimated blood loss: none. Procedure:                Pre-Anesthesia Assessment:                           - ASA Grade Assessment: III - A patient with severe                            systemic disease.                           - After reviewing the risks and benefits, the                            patient was deemed in satisfactory condition to                            undergo the procedure.                           - The anesthesia plan was to use monitored                            anesthesia care (MAC).                           After obtaining informed consent, the colonoscope                            was passed under direct vision. Throughout the                            procedure, the patient's blood pressure, pulse, and                            oxygen saturations were monitored continuously. The                            EC-[id number] ([id number]) scope was introduced through  the anus and advanced to the the terminal ileum,                            with identification of the appendiceal orifice and                            IC valve. The colonoscopy was performed without                            difficulty. The patient tolerated the procedure                            well. The quality of the bowel preparation was   adequate. Scope In: 11:30:24 AM Scope Out: 11:55:18 AM Scope Withdrawal Time: 0 hours 18 minutes 17 seconds  Total Procedure Duration: 0 hours 24 minutes 54 seconds  Findings:      The perianal exam findings include non-thrombosed external hemorrhoids       and non-thrombosed internal hemorrhoids.      Multiple small-mouthed diverticula were found in the sigmoid colon.      There was evidence of a prior functional end-to-end colo-colonic       anastomosis in the sigmoid colon. This was patent and was characterized       by healthy appearing mucosa. The anastomosis was traversed.      The exam was otherwise without abnormality on direct and retroflexion       views. Impression:               - Non-thrombosed external hemorrhoids and                            non-thrombosed internal hemorrhoids found on                            perianal exam.                           - Diverticulosis in the sigmoid colon.                           - Patent functional end-to-end colo-colonic                            anastomosis, characterized by healthy appearing                            mucosa.                           - The examination was otherwise normal on direct                            and retroflexion views.                           - No specimens collected. Moderate Sedation:      N/A- Per Anesthesia Care Recommendation:           -  Discharge patient to home.                           - High fiber diet indefinitely.                           - Continue present medications.                           - Repeat colonoscopy in 5 years for screening                            purposes.                           - Return to primary care physician at appointment                            to be scheduled. Procedure Code(s):        --- Professional ---                           [phone], Colonoscopy, flexible; diagnostic, including                            collection of specimen(s) by  brushing or washing,                            when performed (separate procedure) Diagnosis Code(s):        --- Professional ---                           K62.5, Hemorrhage of anus and rectum CPT copyright 2016 [employer]. All rights reserved. The codes documented in this report are preliminary and upon coder review may  be revised to meet current compliance requirements. Nadeen Landau, MD Ileana Roup MD, MD 01/29/2017 12:02:14 PM This report has been signed electronically. Number of Addenda: 0

## 2017-01-29 NOTE — Anesthesia Postprocedure Evaluation (Signed)
Anesthesia Post Note  Patient: Emily [name]  Procedure(s) Performed: COLONOSCOPY WITH PROPOFOL (N/A )     Patient location during evaluation: PACU Anesthesia Type: MAC Level of consciousness: awake and alert Pain management: pain level controlled Vital Signs Assessment: post-procedure vital signs reviewed and stable Respiratory status: spontaneous breathing, nonlabored ventilation, respiratory function stable and patient connected to nasal cannula oxygen Cardiovascular status: stable and blood pressure returned to baseline Postop Assessment: no apparent nausea or vomiting Anesthetic complications: no    Last Vitals:  Vitals:   01/29/17 1210 01/29/17 1220  BP: (!) 143/93 (!) 164/115  Pulse: 84 72  Resp: 15 12  Temp:    SpO2: 100% 97%    Last Pain:  Vitals:   01/29/17 1220  TempSrc:   PainSc: 4                  Alec Mcphee P Vikash Nest

## 2017-01-29 NOTE — Transfer of Care (Signed)
Immediate Anesthesia Transfer of Care Note  Patient: Emily [name]  Procedure(s) Performed: COLONOSCOPY WITH PROPOFOL (N/A )  Patient Location: Endoscopy Unit  Anesthesia Type:MAC  Level of Consciousness: drowsy and patient cooperative  Airway & Oxygen Therapy: Patient Spontanous Breathing  Post-op Assessment: Report given to RN  Post vital signs: Reviewed and stable  Last Vitals:  Vitals:   01/29/17 1044  BP: (!) 153/104  Pulse: 84  Resp: 11  Temp: 37.6 C  SpO2: 100%    Last Pain:  Vitals:   01/29/17 1044  TempSrc: Oral         Complications: No apparent anesthesia complications

## 2017-01-29 NOTE — Anesthesia Preprocedure Evaluation (Addendum)
Anesthesia Evaluation  Patient identified by MRN, date of birth, ID band Patient awake    Reviewed: Allergy & Precautions, NPO status , Patient's Chart, lab work & pertinent test results  Airway Mallampati: I  TM Distance: >3 FB Neck ROM: Full    Dental  (+) Partial Upper, Partial Lower   Pulmonary former smoker,  allergies   Pulmonary exam normal        Cardiovascular hypertension, Pt. on medications Normal cardiovascular exam  ECG: SR, rate 72   Neuro/Psych  Headaches, Seizures -, Well Controlled,  PSYCHIATRIC DISORDERS Anxiety Depression Multiple sclerosis  PTSD (post-traumatic stress disorder)  Neuromuscular disease    GI/Hepatic Neg liver ROS, GERD  Medicated and Controlled,Rectal bleeding   Endo/Other  negative endocrine ROS  Renal/GU negative Renal ROS     Musculoskeletal   Abdominal (+) + obese,   Peds  Hematology negative hematology ROS (+)   Anesthesia Other Findings   Reproductive/Obstetrics Pregnancy test offered to and declined by patient                            Anesthesia Physical  Anesthesia Plan  ASA: III  Anesthesia Plan: MAC   Post-op Pain Management:    Induction: Intravenous  PONV Risk Score and Plan: 2 and Propofol infusion and Treatment may vary due to age or medical condition  Airway Management Planned: Natural Airway  Additional Equipment:   Intra-op Plan:   Post-operative Plan:   Informed Consent: I have reviewed the patients History and Physical, chart, labs and discussed the procedure including the risks, benefits and alternatives for the proposed anesthesia with the patient or authorized representative who has indicated his/her understanding and acceptance.     Plan Discussed with: CRNA  Anesthesia Plan Comments:        Anesthesia Quick Evaluation

## 2017-01-30 ENCOUNTER — Encounter (HOSPITAL_COMMUNITY): Payer: Self-pay | Admitting: Surgery

## 2017-02-03 ENCOUNTER — Telehealth: Payer: Self-pay | Admitting: Neurology

## 2017-02-03 NOTE — Telephone Encounter (Signed)
Pt called and said she is going on the 29th to be evaluated by a neuro surgeon at [facility] clinic, was referred by the pain management doctor

## 2017-02-03 NOTE — Telephone Encounter (Signed)
Noted  

## 2017-02-04 ENCOUNTER — Telehealth: Payer: Self-pay | Admitting: *Deleted

## 2017-02-04 DIAGNOSIS — M47812 Spondylosis without myelopathy or radiculopathy, cervical region: Secondary | ICD-10-CM | POA: Diagnosis not present

## 2017-02-04 DIAGNOSIS — M5412 Radiculopathy, cervical region: Secondary | ICD-10-CM | POA: Diagnosis not present

## 2017-02-04 NOTE — Telephone Encounter (Signed)
Called patient and she stated will soon be having a MRI on her neck/spine and wanted [place] to know. They will be doing IV sedation and wanted to make sure it is OK.. Patient is unaware of the medication she will receive and when it is scheduled. Will make talk to Dr Dossie Arbour to make he aware.

## 2017-02-06 ENCOUNTER — Other Ambulatory Visit (HOSPITAL_COMMUNITY): Payer: Self-pay | Admitting: Student

## 2017-02-06 DIAGNOSIS — M5416 Radiculopathy, lumbar region: Secondary | ICD-10-CM

## 2017-02-06 DIAGNOSIS — M5412 Radiculopathy, cervical region: Secondary | ICD-10-CM

## 2017-02-09 NOTE — Progress Notes (Addendum)
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 None   Primary Care Physician: Monico Blitz, MD Location: [facility] Note by: Gaspar Cola, MD Date: 02/10/2017; Time: 2:33 PM

## 2017-02-10 ENCOUNTER — Ambulatory Visit: Payer: Medicare Other | Attending: Pain Medicine | Admitting: Pain Medicine

## 2017-02-10 ENCOUNTER — Other Ambulatory Visit: Payer: Self-pay

## 2017-02-10 ENCOUNTER — Encounter: Payer: Self-pay | Admitting: Pain Medicine

## 2017-02-10 VITALS — BP 139/96 | HR 79 | Temp 98.5°F | Resp 18 | Ht 64.0 in | Wt 255.0 lb

## 2017-02-10 DIAGNOSIS — F329 Major depressive disorder, single episode, unspecified: Secondary | ICD-10-CM | POA: Insufficient documentation

## 2017-02-10 DIAGNOSIS — R55 Syncope and collapse: Secondary | ICD-10-CM | POA: Diagnosis not present

## 2017-02-10 DIAGNOSIS — E669 Obesity, unspecified: Secondary | ICD-10-CM | POA: Insufficient documentation

## 2017-02-10 DIAGNOSIS — M4802 Spinal stenosis, cervical region: Secondary | ICD-10-CM | POA: Diagnosis not present

## 2017-02-10 DIAGNOSIS — I10 Essential (primary) hypertension: Secondary | ICD-10-CM | POA: Diagnosis not present

## 2017-02-10 DIAGNOSIS — G35 Multiple sclerosis: Secondary | ICD-10-CM | POA: Diagnosis not present

## 2017-02-10 DIAGNOSIS — G4486 Cervicogenic headache: Secondary | ICD-10-CM | POA: Insufficient documentation

## 2017-02-10 DIAGNOSIS — J039 Acute tonsillitis, unspecified: Secondary | ICD-10-CM | POA: Insufficient documentation

## 2017-02-10 DIAGNOSIS — F17210 Nicotine dependence, cigarettes, uncomplicated: Secondary | ICD-10-CM | POA: Diagnosis not present

## 2017-02-10 DIAGNOSIS — G5603 Carpal tunnel syndrome, bilateral upper limbs: Secondary | ICD-10-CM | POA: Diagnosis not present

## 2017-02-10 DIAGNOSIS — R519 Headache, unspecified: Secondary | ICD-10-CM | POA: Insufficient documentation

## 2017-02-10 DIAGNOSIS — M4722 Other spondylosis with radiculopathy, cervical region: Secondary | ICD-10-CM | POA: Diagnosis not present

## 2017-02-10 DIAGNOSIS — M545 Low back pain, unspecified: Secondary | ICD-10-CM | POA: Insufficient documentation

## 2017-02-10 DIAGNOSIS — M792 Neuralgia and neuritis, unspecified: Secondary | ICD-10-CM | POA: Insufficient documentation

## 2017-02-10 DIAGNOSIS — G40909 Epilepsy, unspecified, not intractable, without status epilepticus: Secondary | ICD-10-CM | POA: Insufficient documentation

## 2017-02-10 DIAGNOSIS — E559 Vitamin D deficiency, unspecified: Secondary | ICD-10-CM | POA: Diagnosis not present

## 2017-02-10 DIAGNOSIS — M7918 Myalgia, other site: Secondary | ICD-10-CM | POA: Insufficient documentation

## 2017-02-10 DIAGNOSIS — Z79899 Other long term (current) drug therapy: Secondary | ICD-10-CM | POA: Insufficient documentation

## 2017-02-10 DIAGNOSIS — Z888 Allergy status to other drugs, medicaments and biological substances status: Secondary | ICD-10-CM | POA: Insufficient documentation

## 2017-02-10 DIAGNOSIS — G894 Chronic pain syndrome: Secondary | ICD-10-CM | POA: Insufficient documentation

## 2017-02-10 DIAGNOSIS — M79601 Pain in right arm: Secondary | ICD-10-CM | POA: Insufficient documentation

## 2017-02-10 DIAGNOSIS — M5412 Radiculopathy, cervical region: Secondary | ICD-10-CM | POA: Diagnosis not present

## 2017-02-10 DIAGNOSIS — G8929 Other chronic pain: Secondary | ICD-10-CM

## 2017-02-10 DIAGNOSIS — H469 Unspecified optic neuritis: Secondary | ICD-10-CM | POA: Insufficient documentation

## 2017-02-10 DIAGNOSIS — K430 Incisional hernia with obstruction, without gangrene: Secondary | ICD-10-CM | POA: Insufficient documentation

## 2017-02-10 DIAGNOSIS — M549 Dorsalgia, unspecified: Secondary | ICD-10-CM | POA: Insufficient documentation

## 2017-02-10 DIAGNOSIS — R51 Headache: Secondary | ICD-10-CM | POA: Diagnosis not present

## 2017-02-10 DIAGNOSIS — M542 Cervicalgia: Secondary | ICD-10-CM

## 2017-02-10 DIAGNOSIS — M79641 Pain in right hand: Secondary | ICD-10-CM | POA: Insufficient documentation

## 2017-02-10 DIAGNOSIS — Z9189 Other specified personal risk factors, not elsewhere classified: Secondary | ICD-10-CM | POA: Insufficient documentation

## 2017-02-10 DIAGNOSIS — M5136 Other intervertebral disc degeneration, lumbar region: Secondary | ICD-10-CM | POA: Insufficient documentation

## 2017-02-10 DIAGNOSIS — M546 Pain in thoracic spine: Secondary | ICD-10-CM | POA: Insufficient documentation

## 2017-02-10 DIAGNOSIS — M79602 Pain in left arm: Secondary | ICD-10-CM | POA: Insufficient documentation

## 2017-02-10 DIAGNOSIS — F411 Generalized anxiety disorder: Secondary | ICD-10-CM | POA: Diagnosis not present

## 2017-02-10 DIAGNOSIS — K219 Gastro-esophageal reflux disease without esophagitis: Secondary | ICD-10-CM | POA: Diagnosis not present

## 2017-02-10 DIAGNOSIS — M79604 Pain in right leg: Secondary | ICD-10-CM | POA: Diagnosis not present

## 2017-02-10 MED ORDER — ROPIVACAINE HCL 2 MG/ML IJ SOLN
4.0000 mL | Freq: Once | INTRAMUSCULAR | Status: AC
  Administered 2017-02-10: 10 mL
  Filled 2017-02-10: qty 10

## 2017-02-10 MED ORDER — PREGABALIN 75 MG PO CAPS: 75.0000 mg | ORAL_CAPSULE | Freq: Three times a day (TID) | ORAL | 2 refills | Status: DC

## 2017-02-10 MED ORDER — GLYCOPYRROLATE 0.2 MG/ML IJ SOLN
INTRAMUSCULAR | Status: AC
  Filled 2017-02-10: qty 1

## 2017-02-10 MED ORDER — LIDOCAINE HCL 2 % IJ SOLN
20.0000 mL | Freq: Once | INTRAMUSCULAR | Status: AC
  Administered 2017-02-10: 400 mg
  Filled 2017-02-10: qty 20

## 2017-02-10 MED ORDER — TRIAMCINOLONE ACETONIDE 40 MG/ML IJ SUSP
40.0000 mg | Freq: Once | INTRAMUSCULAR | Status: AC
  Administered 2017-02-10: 40 mg
  Filled 2017-02-10: qty 1

## 2017-02-10 MED ORDER — GLYCOPYRROLATE 0.2 MG/ML IJ SOLN
0.1000 mg | Freq: Once | INTRAMUSCULAR | Status: AC
  Administered 2017-02-10: 0.1 mg via INTRAMUSCULAR

## 2017-02-10 NOTE — Patient Instructions (Signed)

## 2017-02-10 NOTE — Progress Notes (Signed)
Safety precautions to be maintained throughout the outpatient stay will include: orient to surroundings, keep bed in low position, maintain call bell within reach at all times, provide assistance with transfer out of bed and ambulation.  

## 2017-02-13 ENCOUNTER — Ambulatory Visit (INDEPENDENT_AMBULATORY_CARE_PROVIDER_SITE_OTHER): Payer: Medicare Other | Admitting: Psychiatry

## 2017-02-13 ENCOUNTER — Encounter (HOSPITAL_COMMUNITY): Payer: Self-pay | Admitting: Psychiatry

## 2017-02-13 DIAGNOSIS — F411 Generalized anxiety disorder: Secondary | ICD-10-CM

## 2017-02-13 DIAGNOSIS — F4310 Post-traumatic stress disorder, unspecified: Secondary | ICD-10-CM

## 2017-02-13 DIAGNOSIS — R450 Nervousness: Secondary | ICD-10-CM | POA: Diagnosis not present

## 2017-02-13 DIAGNOSIS — Z87891 Personal history of nicotine dependence: Secondary | ICD-10-CM | POA: Diagnosis not present

## 2017-02-13 DIAGNOSIS — F332 Major depressive disorder, recurrent severe without psychotic features: Secondary | ICD-10-CM | POA: Diagnosis not present

## 2017-02-13 DIAGNOSIS — F4001 Agoraphobia with panic disorder: Secondary | ICD-10-CM

## 2017-02-13 DIAGNOSIS — F445 Conversion disorder with seizures or convulsions: Secondary | ICD-10-CM | POA: Diagnosis not present

## 2017-02-13 MED ORDER — DIPHENHYDRAMINE HCL (SLEEP) 25 MG PO TABS: 50.0000 mg | ORAL_TABLET | Freq: Every evening | ORAL | 3 refills | Status: DC | PRN

## 2017-02-13 MED ORDER — BUSPIRONE HCL 15 MG PO TABS: 15.0000 mg | ORAL_TABLET | Freq: Three times a day (TID) | ORAL | 3 refills | Status: DC

## 2017-02-13 MED ORDER — DIAZEPAM 5 MG PO TABS: 5.0000 mg | ORAL_TABLET | Freq: Every day | ORAL | 3 refills | Status: DC

## 2017-02-13 MED ORDER — FLUOXETINE HCL 40 MG PO CAPS: 80.0000 mg | ORAL_CAPSULE | Freq: Every day | ORAL | 3 refills | Status: DC
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
## 2017-02-14 ENCOUNTER — Telehealth: Payer: Self-pay

## 2017-02-14 NOTE — Telephone Encounter (Signed)
Patient called to notify us that she had tooth pulled at the dentist (Dr. Adair Laundry) and was given Hydrocodone 7.5/325 mg #6, take 1 tablet Q4-6 hours prn.

## 2017-02-17 ENCOUNTER — Telehealth: Payer: Self-pay | Admitting: Pain Medicine

## 2017-02-17 ENCOUNTER — Telehealth (HOSPITAL_COMMUNITY): Payer: Self-pay

## 2017-02-17 NOTE — Telephone Encounter (Signed)
Patient is calling to find out if you can replace the Buspar since [facility] is no longer carrying it. The release date for the backorder is mid to late February.  Please review and advise, thank you

## 2017-02-17 NOTE — Telephone Encounter (Signed)
Patient wants to come in for procedure on lower back. Having increased pain. Only orders for CESI . Will need orders for LESI. Please let patient know if this can be done

## 2017-02-18 ENCOUNTER — Other Ambulatory Visit: Payer: Self-pay | Admitting: Nurse Practitioner

## 2017-02-18 DIAGNOSIS — G8929 Other chronic pain: Secondary | ICD-10-CM

## 2017-02-18 DIAGNOSIS — M79605 Pain in left leg: Principal | ICD-10-CM

## 2017-02-18 DIAGNOSIS — M47816 Spondylosis without myelopathy or radiculopathy, lumbar region: Secondary | ICD-10-CM

## 2017-02-18 DIAGNOSIS — M79604 Pain in right leg: Principal | ICD-10-CM

## 2017-02-18 NOTE — Telephone Encounter (Signed)
Pain is in the lower back, left and middle. Goes down left leg to the ankle. Back pain is worse.

## 2017-02-18 NOTE — Telephone Encounter (Signed)
I have only seen her once several months ago.  Is she having leg pain also? If so which is worse.

## 2017-02-18 NOTE — Telephone Encounter (Signed)
Order in send to [facility] for approval

## 2017-02-18 NOTE — Telephone Encounter (Signed)
Crystal, please advise. 

## 2017-02-18 NOTE — Telephone Encounter (Signed)
Pain goes into back of the leg.

## 2017-02-18 NOTE — Telephone Encounter (Signed)
She has facet and radicular symptoms  We will try the lumbar facet  Please clarify if the pain is posterior going down the leg thanks

## 2017-02-19 NOTE — Telephone Encounter (Signed)
Patient has medicare, I do not need to get any authorizations

## 2017-02-19 NOTE — Telephone Encounter (Signed)
Blanch Media, please address.

## 2017-02-20 MED ORDER — HYDROXYZINE PAMOATE 25 MG PO CAPS: 25.0000 mg | ORAL_CAPSULE | Freq: Two times a day (BID) | ORAL | 0 refills | Status: DC

## 2017-02-20 NOTE — Telephone Encounter (Signed)
We can try some Vistaril 25mg  BID prn anxiety

## 2017-02-24 ENCOUNTER — Ambulatory Visit: Payer: Self-pay | Admitting: Pain Medicine

## 2017-02-25 DIAGNOSIS — I10 Essential (primary) hypertension: Secondary | ICD-10-CM | POA: Diagnosis not present

## 2017-02-25 DIAGNOSIS — Z6841 Body Mass Index (BMI) 40.0 and over, adult: Secondary | ICD-10-CM | POA: Diagnosis not present

## 2017-02-25 DIAGNOSIS — Z299 Encounter for prophylactic measures, unspecified: Secondary | ICD-10-CM | POA: Diagnosis not present

## 2017-02-25 DIAGNOSIS — F419 Anxiety disorder, unspecified: Secondary | ICD-10-CM | POA: Diagnosis not present

## 2017-02-26 NOTE — Telephone Encounter (Signed)
Sedation is usually done by a knowledgeable physician who should ask about patient's allergies, and current medications. From our stand point, there is no problem. This is not a case of the patient taking unsupervised, unknown medications at home.

## 2017-03-01 ENCOUNTER — Other Ambulatory Visit: Payer: Self-pay | Admitting: Pain Medicine

## 2017-03-03 ENCOUNTER — Encounter (HOSPITAL_COMMUNITY): Payer: Self-pay

## 2017-03-03 ENCOUNTER — Other Ambulatory Visit: Payer: Self-pay

## 2017-03-03 ENCOUNTER — Ambulatory Visit (HOSPITAL_COMMUNITY)
Admission: RE | Admit: 2017-03-03 | Discharge: 2017-03-06 | Disposition: A | Discharge status: Home / Self Care | Payer: Medicare Other | Source: Ambulatory Visit | Attending: Neurological Surgery | Admitting: Neurological Surgery

## 2017-03-03 ENCOUNTER — Encounter (HOSPITAL_COMMUNITY)
Admission: RE | Admit: 2017-03-03 | Discharge: 2017-03-03 | Disposition: A | Discharge status: Home / Self Care | Payer: Medicare Other | Source: Ambulatory Visit

## 2017-03-03 DIAGNOSIS — G35 Multiple sclerosis: Secondary | ICD-10-CM | POA: Insufficient documentation

## 2017-03-03 DIAGNOSIS — F329 Major depressive disorder, single episode, unspecified: Secondary | ICD-10-CM | POA: Insufficient documentation

## 2017-03-03 DIAGNOSIS — M5115 Intervertebral disc disorders with radiculopathy, thoracolumbar region: Secondary | ICD-10-CM | POA: Insufficient documentation

## 2017-03-03 DIAGNOSIS — G43909 Migraine, unspecified, not intractable, without status migrainosus: Secondary | ICD-10-CM | POA: Insufficient documentation

## 2017-03-03 DIAGNOSIS — M48061 Spinal stenosis, lumbar region without neurogenic claudication: Secondary | ICD-10-CM | POA: Insufficient documentation

## 2017-03-03 DIAGNOSIS — M5412 Radiculopathy, cervical region: Secondary | ICD-10-CM | POA: Diagnosis present

## 2017-03-03 DIAGNOSIS — M4802 Spinal stenosis, cervical region: Secondary | ICD-10-CM | POA: Diagnosis not present

## 2017-03-03 DIAGNOSIS — F4310 Post-traumatic stress disorder, unspecified: Secondary | ICD-10-CM | POA: Insufficient documentation

## 2017-03-03 DIAGNOSIS — Z79899 Other long term (current) drug therapy: Secondary | ICD-10-CM | POA: Insufficient documentation

## 2017-03-03 DIAGNOSIS — K219 Gastro-esophageal reflux disease without esophagitis: Secondary | ICD-10-CM | POA: Insufficient documentation

## 2017-03-03 DIAGNOSIS — I10 Essential (primary) hypertension: Secondary | ICD-10-CM | POA: Diagnosis not present

## 2017-03-03 DIAGNOSIS — Z87891 Personal history of nicotine dependence: Secondary | ICD-10-CM | POA: Insufficient documentation

## 2017-03-03 DIAGNOSIS — J45909 Unspecified asthma, uncomplicated: Secondary | ICD-10-CM | POA: Insufficient documentation

## 2017-03-03 DIAGNOSIS — M4697 Unspecified inflammatory spondylopathy, lumbosacral region: Secondary | ICD-10-CM | POA: Diagnosis not present

## 2017-03-03 DIAGNOSIS — R569 Unspecified convulsions: Secondary | ICD-10-CM | POA: Insufficient documentation

## 2017-03-03 DIAGNOSIS — Z793 Long term (current) use of hormonal contraceptives: Secondary | ICD-10-CM | POA: Insufficient documentation

## 2017-03-03 DIAGNOSIS — M50122 Cervical disc disorder at C5-C6 level with radiculopathy: Secondary | ICD-10-CM | POA: Diagnosis not present

## 2017-03-03 DIAGNOSIS — F410 Panic disorder [episodic paroxysmal anxiety] without agoraphobia: Secondary | ICD-10-CM | POA: Diagnosis not present

## 2017-03-03 LAB — BASIC METABOLIC PANEL WITH GFR
Anion gap: 12 (ref 5–15)
BUN: 10 mg/dL (ref 6–20)
CO2: 20 mmol/L — ABNORMAL LOW (ref 22–32)
Calcium: 8.8 mg/dL — ABNORMAL LOW (ref 8.9–10.3)
Chloride: 105 mmol/L (ref 101–111)
Creatinine, Ser: 0.93 mg/dL (ref 0.44–1.00)
GFR calc Af Amer: >60 mL/min
GFR calc non Af Amer: >60 mL/min
Glucose, Bld: 82 mg/dL (ref 65–99)
Potassium: 3.3 mmol/L — ABNORMAL LOW (ref 3.5–5.1)
Sodium: 137 mmol/L (ref 135–145)

## 2017-03-03 LAB — CBC
HCT: 45.6 % (ref 36.0–46.0)
Hemoglobin: 15.6 g/dL — ABNORMAL HIGH (ref 12.0–15.0)
MCH: 30.9 pg (ref 26.0–34.0)
MCHC: 34.2 g/dL (ref 30.0–36.0)
MCV: 90.3 fL (ref 78.0–100.0)
PLATELETS: 335 10*3/uL (ref 150–400)
RBC: 5.05 MIL/uL (ref 3.87–5.11)
RDW: 13.5 % (ref 11.5–15.5)
WBC: 9.6 10*3/uL (ref 4.0–10.5)

## 2017-03-03 NOTE — Pre-Procedure Instructions (Signed)
Emily [name]  03/03/2017      [id number] [place] [place] [address] [place] [address] Phone: [phone] Fax: [phone]    Your procedure is scheduled on Thursday, March 06, 2017  Report to [facility] Admitting Entrance "A" at 6:00AM   Call this number if you have problems the morning of surgery:  [phone]   Remember:  Do not eat food or drink liquids after midnight.  Take these medicines the morning of surgery with A SIP OF WATER: BusPIRone (BUSPAR), Divalproex (DEPAKOTE), FLUoxetine (PROZAC), HydrOXYzine (VISTARIL), Methocarbamol (ROBAXIN), Pregabalin (LYRICA), Topiramate (TOPAMAX), and GILENYA. If needed MetoCLOPramide (REGLAN) for nausea, Loratadine (CLARITIN) for allergies, Diazepam (VALIUM) for anxiety, EPINEPHrine (EPIPEN) for allergic reacrions (Bring with you the day of surgery), and Albuterol Inhaler for cough or wheezing (Bring with you the day of surgery).   Do not wear jewelry, make-up or nail polish.  Do not wear lotions, powders,  perfumes, or deodorant.  Do not shave 48 hours prior to surgery.    Do not bring valuables to the hospital.  [facility] is not responsible for any belongings or valuables.  Contacts, dentures or bridgework may not be worn into surgery.  Leave your suitcase in the car.  After surgery it may be brought to your room.  For patients admitted to the hospital, discharge time will be determined by your treatment team.  Patients discharged the day of surgery will not be allowed to drive home.   Special instructions:  [name]- Preparing For Surgery  Before surgery, you can play an important role. Because skin is not sterile, your skin needs to be as free of germs as possible. You can reduce the number of germs on your skin by washing with CHG (chlorahexidine gluconate) Soap before surgery.  CHG is an antiseptic cleaner which kills germs and bonds  with the skin to continue killing germs even after washing.  Please do not use if you have an allergy to CHG or antibacterial soaps. If your skin becomes reddened/irritated stop using the CHG.  Do not shave (including legs and underarms) for at least 48 hours prior to first CHG shower. It is OK to shave your face.  Please follow these instructions carefully.   1. Shower the NIGHT BEFORE SURGERY and the MORNING OF SURGERY with CHG.   2. If you chose to wash your hair, wash your hair first as usual with your normal shampoo.  3. After you shampoo, rinse your hair and body thoroughly to remove the shampoo.  4. Use CHG as you would any other liquid soap. You can apply CHG directly to the skin and wash gently with a scrungie or a clean washcloth.   5. Apply the CHG Soap to your body ONLY FROM THE NECK DOWN.  Do not use on open wounds or open sores. Avoid contact with your eyes, ears, mouth and genitals (private parts). Wash Face and genitals (private parts)  with your normal soap.  6. Wash thoroughly, paying special attention to the area where your surgery will be performed.  7. Thoroughly rinse your body with warm water from the neck down.  8. DO NOT shower/wash with your normal soap after using and rinsing off the CHG Soap.  9. Pat yourself dry with a CLEAN TOWEL.  10. Wear CLEAN PAJAMAS to bed the night before surgery, wear comfortable clothes the morning of surgery  11. Place CLEAN SHEETS on your bed the night of your first shower and DO NOT SLEEP WITH PETS.  Day of Surgery: Do not apply any deodorants/lotions. Please wear clean clothes to the hospital/surgery center.    Please read over the following fact sheets that you were given. Pain Booklet, Coughing and Deep Breathing and Surgical Site Infection Prevention

## 2017-03-03 NOTE — Progress Notes (Signed)
PCP - Dr. Brigitte Pulse  Cardiologist - Denies  Chest x-ray - Denies  EKG -05/28/16 (E)  Stress Test - Denies  ECHO - Denies  Cardiac Cath - Denies  Sleep Study - Yes- Negative CPAP - None  LABS- 03/03/17: CBC, BMP POCT UPREG- DOS  Anesthesia- No  Pt denies having chest pain, sob, or fever at this time. All instructions explained to the pt, with a verbal understanding of the material. Pt agrees to go over the instructions while at home for a better understanding. The opportunity to ask questions was provided.

## 2017-03-04 ENCOUNTER — Ambulatory Visit (HOSPITAL_BASED_OUTPATIENT_CLINIC_OR_DEPARTMENT_OTHER): Payer: Medicare Other | Admitting: Pain Medicine

## 2017-03-04 ENCOUNTER — Other Ambulatory Visit: Payer: Self-pay

## 2017-03-04 ENCOUNTER — Ambulatory Visit
Admission: RE | Admit: 2017-03-04 | Discharge: 2017-03-04 | Disposition: A | Discharge status: Home / Self Care | Payer: Medicare Other | Source: Ambulatory Visit | Attending: Pain Medicine | Admitting: Pain Medicine

## 2017-03-04 ENCOUNTER — Encounter: Payer: Self-pay | Admitting: Pain Medicine

## 2017-03-04 VITALS — BP 133/88 | HR 93 | Temp 97.9°F | Resp 20 | Ht 64.0 in | Wt 255.0 lb

## 2017-03-04 DIAGNOSIS — M47817 Spondylosis without myelopathy or radiculopathy, lumbosacral region: Secondary | ICD-10-CM | POA: Diagnosis not present

## 2017-03-04 DIAGNOSIS — G8929 Other chronic pain: Secondary | ICD-10-CM | POA: Diagnosis not present

## 2017-03-04 DIAGNOSIS — M5441 Lumbago with sciatica, right side: Secondary | ICD-10-CM | POA: Insufficient documentation

## 2017-03-04 DIAGNOSIS — M47816 Spondylosis without myelopathy or radiculopathy, lumbar region: Secondary | ICD-10-CM | POA: Diagnosis not present

## 2017-03-04 DIAGNOSIS — M5442 Lumbago with sciatica, left side: Secondary | ICD-10-CM | POA: Insufficient documentation

## 2017-03-04 DIAGNOSIS — Z888 Allergy status to other drugs, medicaments and biological substances status: Secondary | ICD-10-CM | POA: Insufficient documentation

## 2017-03-04 DIAGNOSIS — Z885 Allergy status to narcotic agent status: Secondary | ICD-10-CM | POA: Insufficient documentation

## 2017-03-04 DIAGNOSIS — Z9189 Other specified personal risk factors, not elsewhere classified: Secondary | ICD-10-CM

## 2017-03-04 DIAGNOSIS — Z79899 Other long term (current) drug therapy: Secondary | ICD-10-CM | POA: Diagnosis not present

## 2017-03-04 DIAGNOSIS — M7918 Myalgia, other site: Secondary | ICD-10-CM

## 2017-03-04 MED ORDER — MIDAZOLAM HCL 5 MG/5ML IJ SOLN
1.0000 mg | INTRAMUSCULAR | Status: DC | PRN
  Administered 2017-03-04: 2 mg via INTRAVENOUS
  Filled 2017-03-04: qty 5

## 2017-03-04 MED ORDER — ROPIVACAINE HCL 2 MG/ML IJ SOLN
9.0000 mL | Freq: Once | INTRAMUSCULAR | Status: AC
  Administered 2017-03-04: 9 mL via PERINEURAL
  Filled 2017-03-04: qty 10

## 2017-03-04 MED ORDER — FENTANYL CITRATE (PF) 100 MCG/2ML IJ SOLN
25.0000 ug | INTRAMUSCULAR | Status: DC | PRN
  Administered 2017-03-04: 50 ug via INTRAVENOUS
  Filled 2017-03-04: qty 2

## 2017-03-04 MED ORDER — TRIAMCINOLONE ACETONIDE 40 MG/ML IJ SUSP
40.0000 mg | Freq: Once | INTRAMUSCULAR | Status: AC
  Administered 2017-03-04: 40 mg
  Filled 2017-03-04: qty 1

## 2017-03-04 MED ORDER — LIDOCAINE HCL 2 % IJ SOLN
20.0000 mL | Freq: Once | INTRAMUSCULAR | Status: AC
  Administered 2017-03-04: 400 mg
  Filled 2017-03-04: qty 40

## 2017-03-04 MED ORDER — LACTATED RINGERS IV SOLN
1000.0000 mL | Freq: Once | INTRAVENOUS | Status: AC
Start: 2017-03-04 — End: 2017-03-04
  Administered 2017-03-04: 1000 mL via INTRAVENOUS

## 2017-03-04 MED ORDER — METHOCARBAMOL 750 MG PO TABS: 750.0000 mg | ORAL_TABLET | Freq: Two times a day (BID) | ORAL | 0 refills | Status: DC

## 2017-03-04 MED ORDER — GLYCOPYRROLATE 0.2 MG/ML IJ SOLN
0.2000 mg | Freq: Once | INTRAMUSCULAR | Status: AC
  Administered 2017-03-04: 0.2 mg via INTRAVENOUS
  Filled 2017-03-04: qty 1

## 2017-03-04 NOTE — Progress Notes (Signed)
Patient's Name: Emily [name]  MRN: [id number]  Referring Provider: Vevelyn Francois, NP  DOB: [birth year]  PCP: Monico Blitz, MD  DOS: 03/04/2017  Note by: Gaspar Cola, MD  Service setting: Ambulatory outpatient  Specialty: Interventional Pain Management  Patient type: Established  Location: [facility] (AMB) Pain Management Facility  Visit type: Interventional Procedure   Primary Reason for Visit: Interventional Pain Management Treatment. CC: Back Pain (lower left)  Procedure:       Anesthesia, Analgesia, Anxiolysis:  Type: Lumbar Facet, Medial Branch Block #1  Level: L2, L3, L4, L5, & S1 Medial Branch Level(s). Lesioning of these levels should completely denervate the L3-4, L4-5, and the L5-S1 lumbar facet joints. Primary Purpose: Diagnostic Region: Posterolateral Lumbosacral Spine Laterality: Left  Type: Local Anesthesia with Moderate (Conscious) Sedation Local Anesthetic: Lidocaine 1% Route: Intravenous (IV) IV Access: Secured Sedation: Meaningful verbal contact was maintained at all times during the procedure  Indication(s): Analgesia and Anxiety   Indications: 1. Spondylosis without myelopathy or radiculopathy, lumbosacral region   2. Chronic low back pain (Secondary Area of Pain) (Bilateral) (R>L)   3. Lumbar facet syndrome (Bilateral) (R>L)   4. History of fainting (vasovagal)    Pain Score: Pre-procedure: 7 /10 Post-procedure: 0-No pain/10  Pre-op Assessment:  Emily [name] is a 37 y.o. (year old), female patient, seen today for interventional treatment. 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 Emily [name] has a current medication  list which includes the following prescription(s): albuterol, amlodipine, calcium plus d3 absorbable, diazepam, diphenhydramine, divalproex, epinephrine, etonogestrel, fluoxetine, gilenya, hydroxyzine, ibuprofen, loratadine, methocarbamol, metoclopramide, multiple vitamins-minerals, onabotulinumtoxina, phentermine, pregabalin, ranitidine, sumatriptan, topiramate, and buspirone, and the following Facility-Administered Medications: fentanyl, metoCLOPramide (REGLAN) 10 mg in dextrose 5 % 50 mL IVPB, metoclopramide, and midazolam. Her primarily concern today is the Back Pain (lower left)  Initial Vital Signs:  Pulse Rate: 93 Temp: 98.1 F (36.7 C) Resp: 18 BP: 132/81 SpO2: 100 %  BMI: Estimated body mass index is 43.77 kg/m as calculated from the following:   Height as of this encounter: 5\' 4"  (1.626 m).   Weight as of this encounter: 255 lb (115.7 kg).  Risk Assessment: Allergies: Reviewed. She is allergic to amitriptyline; baclofen; cymbalta [duloxetine hcl]; gabapentin; monosodium glutamate; other; vicodin [hydrocodone-acetaminophen]; magnesium salicylate; rizatriptan; tizanidine; tramadol; adhesive [tape]; lamotrigine; and toradol [ketorolac tromethamine].  Allergy Precautions: None required Coagulopathies: Reviewed. None identified.  Blood-thinner therapy: None at this time Active Infection(s): Reviewed. None identified. Emily [name] is afebrile  Site Confirmation: Emily [name] was asked to confirm the procedure and laterality before marking the site Procedure checklist: Completed Consent: Before the procedure and under the influence of no sedative(s), amnesic(s), or anxiolytics, the patient was informed of the treatment options, risks and possible complications. To fulfill our ethical and legal obligations, as recommended by the American Medical Association's Code of Ethics, I have informed the patient of my clinical impression; the nature and purpose of the treatment or procedure; the risks,  benefits, and possible complications of the intervention; the alternatives, including doing nothing; the risk(s) and benefit(s) of the alternative treatment(s) or procedure(s); and the risk(s) and benefit(s) of doing nothing. The patient was provided information about the general risks and possible complications associated with the procedure. These may include,  but are not limited to: failure to achieve desired goals, infection, bleeding, organ or nerve damage, allergic reactions, paralysis, and death. In addition, the patient was informed of those risks and complications associated to Spine-related procedures, such as failure to decrease pain; infection (i.e.: Meningitis, epidural or intraspinal abscess); bleeding (i.e.: epidural hematoma, subarachnoid hemorrhage, or any other type of intraspinal or peri-dural bleeding); organ or nerve damage (i.e.: Any type of peripheral nerve, nerve root, or spinal cord injury) with subsequent damage to sensory, motor, and/or autonomic systems, resulting in permanent pain, numbness, and/or weakness of one or several areas of the body; allergic reactions; (i.e.: anaphylactic reaction); and/or death. Furthermore, the patient was informed of those risks and complications associated with the medications. These include, but are not limited to: allergic reactions (i.e.: anaphylactic or anaphylactoid reaction(s)); adrenal axis suppression; blood sugar elevation that in diabetics may result in ketoacidosis or comma; water retention that in patients with history of congestive heart failure may result in shortness of breath, pulmonary edema, and decompensation with resultant heart failure; weight gain; swelling or edema; medication-induced neural toxicity; particulate matter embolism and blood vessel occlusion with resultant organ, and/or nervous system infarction; and/or aseptic necrosis of one or more joints. Finally, the patient was informed that Medicine is not an exact science;  therefore, there is also the possibility of unforeseen or unpredictable risks and/or possible complications that may result in a catastrophic outcome. The patient indicated having understood very clearly. We have given the patient no guarantees and we have made no promises. Enough time was given to the patient to ask questions, all of which were answered to the patient's satisfaction. Ms. Houchen has indicated that she wanted to continue with the procedure. Attestation: I, the ordering provider, attest that I have discussed with the patient the benefits, risks, side-effects, alternatives, likelihood of achieving goals, and potential problems during recovery for the procedure that I have provided informed consent. Date  Time: 03/04/2017  8:14 AM  Pre-Procedure Preparation:  Monitoring: As per clinic protocol. Respiration, ETCO2, SpO2, BP, heart rate and rhythm monitor placed and checked for adequate function Safety Precautions: Patient was assessed for positional comfort and pressure points before starting the procedure. Time-out: I initiated and conducted the "Time-out" before starting the procedure, as per protocol. The patient was asked to participate by confirming the accuracy of the "Time Out" information. Verification of the correct person, site, and procedure were performed and confirmed by me, the nursing staff, and the patient. "Time-out" conducted as per Joint Commission's Universal Protocol (UP.01.01.01). Time: 0910  Description of Procedure:       Position: Prone Laterality: Left Levels:  L2, L3, L4, L5, & S1 Medial Branch Level(s) Area Prepped: Lumbosacral Prepping solution: ChloraPrep (2% chlorhexidine gluconate and 70% isopropyl alcohol) Safety Precautions: Aspiration looking for blood return was conducted prior to all injections. At no point did we inject any substances, as a needle was being advanced. Before injecting, the patient was told to immediately notify me if she was  experiencing any new onset of "ringing in the ears, or metallic taste in the mouth". No attempts were made at seeking any paresthesias. Safe injection practices and needle disposal techniques used. Medications properly checked for expiration dates. SDV (single dose vial) medications used. After the completion of the procedure, all disposable equipment used was discarded in the proper designated medical waste containers. Local Anesthesia: Protocol guidelines were followed. The patient was positioned over the fluoroscopy table. The area was prepped in the usual manner. The  time-out was completed. The target area was identified using fluoroscopy. A 12-in long, straight, sterile hemostat was used with fluoroscopic guidance to locate the targets for each level blocked. Once located, the skin was marked with an approved surgical skin marker. Once all sites were marked, the skin (epidermis, dermis, and hypodermis), as well as deeper tissues (fat, connective tissue and muscle) were infiltrated with a small amount of a short-acting local anesthetic, loaded on a 10cc syringe with a 25G, 1.5-in  Needle. An appropriate amount of time was allowed for local anesthetics to take effect before proceeding to the next step. Local Anesthetic: Lidocaine 2.0% The unused portion of the local anesthetic was discarded in the proper designated containers. Technical explanation of process:  L2 Medial Branch Nerve Block (MBB): The target area for the L2 medial branch is at the junction of the postero-lateral aspect of the superior articular process and the superior, posterior, and medial edge of the transverse process of L3. Under fluoroscopic guidance, a Quincke needle was inserted until contact was made with os over the superior postero-lateral aspect of the pedicular shadow (target area). After negative aspiration for blood, 0.5 mL of the nerve block solution was injected without difficulty or complication. The needle was removed  intact. L3 Medial Branch Nerve Block (MBB): The target area for the L3 medial branch is at the junction of the postero-lateral aspect of the superior articular process and the superior, posterior, and medial edge of the transverse process of L4. Under fluoroscopic guidance, a Quincke needle was inserted until contact was made with os over the superior postero-lateral aspect of the pedicular shadow (target area). After negative aspiration for blood, 0.5 mL of the nerve block solution was injected without difficulty or complication. The needle was removed intact. L4 Medial Branch Nerve Block (MBB): The target area for the L4 medial branch is at the junction of the postero-lateral aspect of the superior articular process and the superior, posterior, and medial edge of the transverse process of L5. Under fluoroscopic guidance, a Quincke needle was inserted until contact was made with os over the superior postero-lateral aspect of the pedicular shadow (target area). After negative aspiration for blood, 0.5 mL of the nerve block solution was injected without difficulty or complication. The needle was removed intact. L5 Medial Branch Nerve Block (MBB): The target area for the L5 medial branch is at the junction of the postero-lateral aspect of the superior articular process and the superior, posterior, and medial edge of the sacral ala. Under fluoroscopic guidance, a Quincke needle was inserted until contact was made with os over the superior postero-lateral aspect of the pedicular shadow (target area). After negative aspiration for blood, 0.5 mL of the nerve block solution was injected without difficulty or complication. The needle was removed intact. S1 Medial Branch Nerve Block (MBB): The target area for the S1 medial branch is at the posterior and inferior 6 o'clock position of the L5-S1 facet joint. Under fluoroscopic guidance, the Quincke needle inserted for the L5 MBB was redirected until contact was made with  os over the inferior and postero aspect of the sacrum, at the 6 o' clock position under the L5-S1 facet joint (Target area). After negative aspiration for blood, 0.5 mL of the nerve block solution was injected without difficulty or complication. The needle was removed intact. Procedural Needles: 22-gauge, 3.5-inch, Quincke needles used for all levels. Nerve block solution: 0.2% PF-Ropivacaine + Triamcinolone (40 mg/mL) diluted to a final concentration of 4 mg of Triamcinolone/mL of  Ropivacaine The unused portion of the solution was discarded in the proper designated containers.  Once the entire procedure was completed, the treated area was cleaned, making sure to leave some of the prepping solution back to take advantage of its long term bactericidal properties.   Illustration of the posterior view of the lumbar spine and the posterior neural structures. Laminae of L2 through S1 are labeled. DPRL5, dorsal primary ramus of L5; DPRS1, dorsal primary ramus of S1; DPR3, dorsal primary ramus of L3; FJ, facet (zygapophyseal) joint L3-L4; I, inferior articular process of L4; LB1, lateral branch of dorsal primary ramus of L1; IAB, inferior articular branches from L3 medial branch (supplies L4-L5 facet joint); IBP, intermediate branch plexus; MB3, medial branch of dorsal primary ramus of L3; NR3, third lumbar nerve root; S, superior articular process of L5; SAB, superior articular branches from L4 (supplies L4-5 facet joint also); TP3, transverse process of L3.  Vitals:   03/04/17 0923 03/04/17 0935 03/04/17 0944 03/04/17 0953  BP: 127/81 137/76 (!) 143/84 133/88  Pulse:      Resp: 15 16 18 20   Temp:  (!) 97.4 F (36.3 C)  97.9 F (36.6 C)  SpO2: 96% 99% 99% 97%  Weight:      Height:        Start Time: 0910 hrs. End Time: 0922 hrs. Imaging Guidance (Spinal):  Type of Imaging Technique: Fluoroscopy Guidance (Spinal) Indication(s): Assistance in needle guidance and placement for procedures requiring  needle placement in or near specific anatomical locations not easily accessible without such assistance. Exposure Time: Please see nurses notes. Contrast: None used. Fluoroscopic Guidance: I was personally present during the use of fluoroscopy. "Tunnel Vision Technique" used to obtain the best possible view of the target area. Parallax error corrected before commencing the procedure. "Direction-depth-direction" technique used to introduce the needle under continuous pulsed fluoroscopy. Once target was reached, antero-posterior, oblique, and lateral fluoroscopic projection used confirm needle placement in all planes. Images permanently stored in EMR. Interpretation: No contrast injected. I personally interpreted the imaging intraoperatively. Adequate needle placement confirmed in multiple planes. Permanent images saved into the patient's record.  Antibiotic Prophylaxis:   Anti-infectives (From admission, onward)   None     Indication(s): None identified  Post-operative Assessment:  Post-procedure Vital Signs:  Pulse Rate: 93 Temp: 97.9 F (36.6 C) Resp: 20 BP: 133/88 SpO2: 97 %  EBL: None  Complications: No immediate post-treatment complications observed by team, or reported by patient.  Note: The patient tolerated the entire procedure well. A repeat set of vitals were taken after the procedure and the patient was kept under observation following institutional policy, for this type of procedure. Post-procedural neurological assessment was performed, showing return to baseline, prior to discharge. The patient was provided with post-procedure discharge instructions, including a section on how to identify potential problems. Should any problems arise concerning this procedure, the patient was given instructions to immediately contact us, at any time, without hesitation. In any case, we plan to contact the patient by telephone for a follow-up status report regarding this interventional  procedure.  Comments:  No additional relevant information.  Plan of Care    Imaging Orders     DG C-Arm 1-60 Min-No Report  Procedure Orders     LUMBAR FACET(MEDIAL BRANCH NERVE BLOCK) MBNB  Medications ordered for procedure: Meds ordered this encounter  Medications  . midazolam (VERSED) 5 MG/5ML injection 1-2 mg    Make sure Flumazenil is available in the pyxis when using this  medication. If oversedation occurs, administer 0.2 mg IV over 15 sec. If after 45 sec no response, administer 0.2 mg again over 1 min; may repeat at 1 min intervals; not to exceed 4 doses (1 mg)  . fentaNYL (SUBLIMAZE) injection 25-50 mcg    Make sure Narcan is available in the pyxis when using this medication. In the event of respiratory depression (RR< 8/min): Titrate NARCAN (naloxone) in increments of 0.1 to 0.2 mg IV at 2-3 minute intervals, until desired degree of reversal.  . lactated ringers infusion 1,000 mL  . glycopyrrolate (ROBINUL) injection 0.2 mg  . lidocaine (XYLOCAINE) 2 % (with pres) injection 400 mg  . ropivacaine (PF) 2 mg/mL (0.2%) (NAROPIN) injection 9 mL  . triamcinolone acetonide (KENALOG-40) injection 40 mg  . methocarbamol (ROBAXIN) 750 MG tablet    Sig: Take 1 tablet (750 mg total) by mouth 2 (two) times daily.    Dispense:  180 tablet    Refill:  0    Do not place medication on "Automatic Refill". Fill one day early if pharmacy is closed on scheduled refill date. Please provide generic.   Medications administered: We administered midazolam, fentaNYL, lactated ringers, glycopyrrolate, lidocaine, ropivacaine (PF) 2 mg/mL (0.2%), and triamcinolone acetonide.  See the medical record for exact dosing, route, and time of administration.  New Prescriptions   No medications on file   Disposition: Discharge home  Discharge Date & Time: 03/04/2017; (P) 0955 hrs.   Physician-requested Follow-up: Return for post-procedure eval (2 wks).  Future Appointments  Date Time Provider  [place]  03/06/2017  8:00 AM [facility] 2 [facility]  03/06/2017  9:00 AM [facility]-MR 2 [facility]  03/24/2017  8:15 AM Milinda Pointer, MD [facility] None  05/12/2017 11:30 AM Cameron Sprang, MD [facility] None  05/15/2017  1:00 PM Charlcie Cradle, MD [facility] None   Primary Care Physician: Monico Blitz, MD Location: [facility] Note by: Gaspar Cola, MD Date: 03/04/2017; Time: 11:46 AM  Disclaimer:  Medicine is not an Chief Strategy Officer. The only guarantee in medicine is that nothing is guaranteed. It is important to note that the decision to proceed with this intervention was based on the information collected from the patient. The Data and conclusions were drawn from the patient's questionnaire, the interview, and the physical examination. Because the information was provided in large part by the patient, it cannot be guaranteed that it has not been purposely or unconsciously manipulated. Every effort has been made to obtain as much relevant data as possible for this evaluation. It is important to note that the conclusions that lead to this procedure are derived in large part from the available data. Always take into account that the treatment will also be dependent on availability of resources and existing treatment guidelines, considered by other Pain Management Practitioners as being common knowledge and practice, at the time of the intervention. For Medico-Legal purposes, it is also important to point out that variation in procedural techniques and pharmacological choices are the acceptable norm. The indications, contraindications, technique, and results of the above procedure should only be interpreted and judged by a Board-Certified Interventional Pain Specialist with extensive familiarity and expertise in the same exact procedure and technique.

## 2017-03-04 NOTE — Patient Instructions (Addendum)
____________________________________________________________________________________________  Post-Procedure Discharge Instructions  Instructions:  Apply ice: Fill a plastic sandwich bag with crushed ice. Cover it with a small towel and apply to injection site. Apply for 15 minutes then remove x 15 minutes. Repeat sequence on day of procedure, until you go to bed. The purpose is to minimize swelling and discomfort after procedure.  Apply heat: Apply heat to procedure site starting the day following the procedure. The purpose is to treat any soreness and discomfort from the procedure.  Food intake: Start with clear liquids (like water) and advance to regular food, as tolerated.   Physical activities: Keep activities to a minimum for the first 8 hours after the procedure.   Driving: If you have received any sedation, you are not allowed to drive for 24 hours after your procedure.  Blood thinner: Restart your blood thinner 6 hours after your procedure. (Only for those taking blood thinners)  Insulin: As soon as you can eat, you may resume your normal dosing schedule. (Only for those taking insulin)  Infection prevention: Keep procedure site clean and dry.  Post-procedure Pain Diary: Extremely important that this be done correctly and accurately. Recorded information will be used to determine the next step in treatment.  Pain evaluated is that of treated area only. Do not include pain from an untreated area.  Complete every hour, on the hour, for the initial 8 hours. Set an alarm to help you do this part accurately.  Do not go to sleep and have it completed later. It will not be accurate.  Follow-up appointment: Keep your follow-up appointment after the procedure. Usually 2 weeks for most procedures. (6 weeks in the case of radiofrequency.) Bring you pain diary.   Expect:  From numbing medicine (AKA: Local Anesthetics): Numbness or decrease in pain.  Onset: Full effect within 15  minutes of injected.  Duration: It will depend on the type of local anesthetic used. On the average, 1 to 8 hours.   From steroids: Decrease in swelling or inflammation. Once inflammation is improved, relief of the pain will follow.  Onset of benefits: Depends on the amount of swelling present. The more swelling, the longer it will take for the benefits to be seen. In some cases, up to 10 days.  Duration: Steroids will stay in the system x 2 weeks. Duration of benefits will depend on multiple posibilities including persistent irritating factors.  From procedure: Some discomfort is to be expected once the numbing medicine wears off. This should be minimal if ice and heat are applied as instructed.  Call if:  You experience numbness and weakness that gets worse with time, as opposed to wearing off.  New onset bowel or bladder incontinence. (This applies to Spinal procedures only)  Emergency Numbers:  Durning business hours (Monday - Thursday, 8:00 AM - 4:00 PM) (Friday, 9:00 AM - 12:00 Noon): [phone]  After hours: [phone] ____________________________________________________________________________________________   ____________________________________________________________________________________________  Medication Rules  Applies to: All patients receiving prescriptions (written or electronic).  Pharmacy of record: Pharmacy where electronic prescriptions will be sent. If written prescriptions are taken to a different pharmacy, please inform the nursing staff. The pharmacy listed in the electronic medical record should be the one where you would like electronic prescriptions to be sent.  Prescription refills: Only during scheduled appointments. Applies to both, written and electronic prescriptions.  NOTE: The following applies primarily to controlled substances (Opioid* Pain Medications).   Patient's responsibilities: 1. Pain Pills: Bring all pain pills to every    appointment (except for procedure appointments). 2. Pill Bottles: Bring pills in original pharmacy bottle. Always bring newest bottle. Bring bottle, even if empty. 3. Medication refills: You are responsible for knowing and keeping track of what medications you need refilled. The day before your appointment, write a list of all prescriptions that need to be refilled. Bring that list to your appointment and give it to the admitting nurse. Prescriptions will be written only during appointments. If you forget a medication, it will not be "Called in", "Faxed", or "electronically sent". You will need to get another appointment to get these prescribed. 4. Prescription Accuracy: You are responsible for carefully inspecting your prescriptions before leaving our office. Have the discharge nurse carefully go over each prescription with you, before taking them home. Make sure that your name is accurately spelled, that your address is correct. Check the name and dose of your medication to make sure it is accurate. Check the number of pills, and the written instructions to make sure they are clear and accurate. Make sure that you are given enough medication to last until your next medication refill appointment. 5. Taking Medication: Take medication as prescribed. Never take more pills than instructed. Never take medication more frequently than prescribed. Taking less pills or less frequently is permitted and encouraged, when it comes to controlled substances (written prescriptions).  6. Inform other Doctors: Always inform, all of your healthcare providers, of all the medications you take. 7. Pain Medication from other Providers: You are not allowed to accept any additional pain medication from any other Doctor or Healthcare provider. There are two exceptions to this rule. (see below) In the event that you require additional pain medication, you are responsible for notifying us, as stated below. 8. Medication Agreement: You  are responsible for carefully reading and following our Medication Agreement. This must be signed before receiving any prescriptions from our practice. Safely store a copy of your signed Agreement. Violations to the Agreement will result in no further prescriptions. (Additional copies of our Medication Agreement are available upon request.) 9. Laws, Rules, & Regulations: All patients are expected to follow all Federal and [employer], [employer], Rules, [employer]. Ignorance of the Laws does not constitute a valid excuse. The use of any illegal substances is prohibited. 10. Adopted CDC guidelines & recommendations: Target dosing levels will be at or below 60 MME/day. Use of benzodiazepines** is not recommended.  Exceptions: There are only two exceptions to the rule of not receiving pain medications from other Healthcare Providers. 1. Exception #1 (Emergencies): In the event of an emergency (i.e.: accident requiring emergency care), you are allowed to receive additional pain medication. However, you are responsible for: As soon as you are able, call our office [phone], at any time of the day or night, and leave a message stating your name, the date and nature of the emergency, and the name and dose of the medication prescribed. In the event that your call is answered by a member of our staff, make sure to document and save the date, time, and the name of the person that took your information.  2. Exception #2 (Planned Surgery): In the event that you are scheduled by another doctor or dentist to have any type of surgery or procedure, you are allowed (for a period no longer than 30 days), to receive additional pain medication, for the acute post-op pain. However, in this case, you are responsible for picking up a copy of our "Post-op Pain Management for Surgeons"  handout, and giving it to your surgeon or dentist. This document is available at our office, and does not require an appointment to obtain it.  Simply go to our office during business hours (Monday-Thursday from 8:00 AM to 4:00 PM) (Friday 8:00 AM to 12:00 Noon) or if you have a scheduled appointment with [place], prior to your surgery, and ask for it by name. In addition, you will need to provide [place] with your name, name of your surgeon, type of surgery, and date of procedure or surgery.  *Opioid medications include: morphine, codeine, oxycodone, oxymorphone, hydrocodone, hydromorphone, meperidine, tramadol, tapentadol, buprenorphine, fentanyl, methadone. **Benzodiazepine medications include: diazepam (Valium), alprazolam (Xanax), clonazepam (Klonopine), lorazepam (Ativan), clorazepate (Tranxene), chlordiazepoxide (Librium), estazolam (Prosom), oxazepam (Serax), temazepam (Restoril), triazolam (Halcion) ____________________________________________________________________________________________ ____________________________________________________________________________________________  Medication Recommendations and Reminders  Applies to: All patients receiving prescriptions (written and/or electronic).  Medication Rules & Regulations: These rules and regulations exist for your safety and that of others. They are not flexible and neither are we. Dismissing or ignoring them will be considered "non-compliance" with medication therapy, resulting in complete and irreversible termination of such therapy. (See document titled "Medication Rules" for more details.) In all conscience, because of safety reasons, we cannot continue providing a therapy where the patient does not follow instructions.  Pharmacy of record:   Definition: This is the pharmacy where your electronic prescriptions will be sent.   We do not endorse any particular pharmacy.  You are not restricted in your choice of pharmacy.  The pharmacy listed in the electronic medical record should be the one where you want electronic prescriptions to be sent.  If you choose to change  pharmacy, simply notify our nursing staff of your choice of new pharmacy.  Recommendations:  Keep all of your pain medications in a safe place, under lock and key, even if you live alone.   After you fill your prescription, take 1 week's worth of pills and put them away in a safe place. You should keep a separate, properly labeled bottle for this purpose. The remainder should be kept in the original bottle. Use this as your primary supply, until it runs out. Once it's gone, then you know that you have 1 week's worth of medicine, and it is time to come in for a prescription refill. If you do this correctly, it is unlikely that you will ever run out of medicine.  To make sure that the above recommendation works, it is very important that you make sure your medication refill appointments are scheduled at least 1 week before you run out of medicine. To do this in an effective manner, make sure that you do not leave the office without scheduling your next medication management appointment. Always ask the nursing staff to show you in your prescription , when your medication will be running out. Then arrange for the receptionist to get you a return appointment, at least 7 days before you run out of medicine. Do not wait until you have 1 or 2 pills left, to come in. This is very poor planning and does not take into consideration that we may need to cancel appointments due to bad weather, sickness, or emergencies affecting our staff.  Prescription refills and/or changes in medication(s):   Prescription refills, and/or changes in dose or medication, will be conducted only during scheduled medication management appointments. (Applies to both, written and electronic prescriptions.)  No medication will be changed or started on procedure days. No changes, adjustments, and/or refills will be conducted  on a procedure day. Doing so will interfere with the diagnostic portion of the procedure.  No phone refills. No  medications will be "called into the pharmacy".  No Fax refills.  No weekend refills.  No Holliday refills.  No after hours refills.  Remember:  Business hours are:  Monday to Thursday 8:00 AM to 4:00 PM Provider's Schedule: Dionisio David, NP - Appointments are:  Medication management: Monday to Thursday 8:00 AM to 4:00 PM Milinda Pointer, MD - Appointments are:  Medication management: Monday and Wednesday 8:00 AM to 4:00 PM Procedures: Tuesday and Thursday 7:30 AM to 4:00 PM Gillis Santa, MD - Appointments are:  Medication management: Tuesday and Thursday 8:00 AM to 4:00 PM Procedures days: Monday and Wednesday 7:30 AM to 4:00 PM ____________________________________________________________________________________________

## 2017-03-04 NOTE — Progress Notes (Signed)
Safety precautions to be maintained throughout the outpatient stay will include: orient to surroundings, keep bed in low position, maintain call bell within reach at all times, provide assistance with transfer out of bed and ambulation.  

## 2017-03-05 ENCOUNTER — Telehealth: Payer: Self-pay | Admitting: *Deleted

## 2017-03-05 ENCOUNTER — Telehealth: Payer: Self-pay | Admitting: Neurology

## 2017-03-05 NOTE — Telephone Encounter (Signed)
Voicemail left with patient to please call our office if there are questions or concerns re; procedure on yesterday.  

## 2017-03-05 NOTE — Telephone Encounter (Signed)
Spoke with pt.  She states that she is no longer receiving Botox due to her insurance not covering it.  She is requesting an increase in her Reglan stating that she has terrible migraines and stays nauseous at least half of the month.  She goes on to say that Imitrex is helpful, but not long lasting since stopping Botox.  Let her know I would send message to Dr. Delice Lesch about Reglan.  Please advise.       Sandi - do you happen to know what is going on with her PA for Botox?  She says that she has been told a few different things.  One person told her that it would be covered if it were sent to our office, and another person said it would not be covered at all - no matter the circumstance.  (both individuals she spoke with were from the insurance company)

## 2017-03-05 NOTE — Telephone Encounter (Signed)
Patient states that she needs to tallk with someone about her migraine medication

## 2017-03-05 NOTE — Anesthesia Preprocedure Evaluation (Addendum)
Anesthesia Evaluation  Patient identified by MRN, date of birth, ID band Patient awake    Reviewed: Allergy & Precautions, H&P , NPO status , Patient's Chart, lab work & pertinent test results  Airway Mallampati: II  TM Distance: >3 FB Neck ROM: Full    Dental no notable dental hx. (+) Teeth Intact, Dental Advisory Given   Pulmonary asthma , former smoker,    Pulmonary exam normal breath sounds clear to auscultation       Cardiovascular Exercise Tolerance: Good hypertension,  Rhythm:Regular Rate:Normal     Neuro/Psych  Headaches, Anxiety Depression  Neuromuscular disease    GI/Hepatic Neg liver ROS, GERD  Medicated and Controlled,  Endo/Other  Morbid obesity  Renal/GU negative Renal ROS  negative genitourinary   Musculoskeletal  (+) Arthritis , Osteoarthritis,    Abdominal   Peds  Hematology negative hematology ROS (+)   Anesthesia Other Findings   Reproductive/Obstetrics negative OB ROS                            Anesthesia Physical Anesthesia Plan  ASA: III  Anesthesia Plan: General   Post-op Pain Management:    Induction: Intravenous  PONV Risk Score and Plan: 3 and Ondansetron, Dexamethasone and Midazolam  Airway Management Planned: Oral ETT  Additional Equipment:   Intra-op Plan:   Post-operative Plan: Extubation in OR  Informed Consent: I have reviewed the patients History and Physical, chart, labs and discussed the procedure including the risks, benefits and alternatives for the proposed anesthesia with the patient or authorized representative who has indicated his/her understanding and acceptance.   Dental advisory given  Plan Discussed with: CRNA  Anesthesia Plan Comments:         Anesthesia Quick Evaluation

## 2017-03-06 ENCOUNTER — Ambulatory Visit (HOSPITAL_COMMUNITY): Payer: Medicare Other | Admitting: Certified Registered"

## 2017-03-06 ENCOUNTER — Encounter (HOSPITAL_COMMUNITY): Admission: RE | Disposition: A | Discharge status: Home / Self Care | Payer: Self-pay | Source: Ambulatory Visit

## 2017-03-06 ENCOUNTER — Other Ambulatory Visit: Payer: Self-pay

## 2017-03-06 ENCOUNTER — Ambulatory Visit (HOSPITAL_COMMUNITY)
Admission: RE | Admit: 2017-03-06 | Discharge: 2017-03-06 | Disposition: A | Discharge status: Home / Self Care | Payer: Medicare Other | Source: Ambulatory Visit | Attending: Student | Admitting: Student

## 2017-03-06 ENCOUNTER — Encounter (HOSPITAL_COMMUNITY): Payer: Self-pay | Admitting: Surgery

## 2017-03-06 DIAGNOSIS — F329 Major depressive disorder, single episode, unspecified: Secondary | ICD-10-CM | POA: Diagnosis not present

## 2017-03-06 DIAGNOSIS — J45909 Unspecified asthma, uncomplicated: Secondary | ICD-10-CM | POA: Diagnosis not present

## 2017-03-06 DIAGNOSIS — M5416 Radiculopathy, lumbar region: Secondary | ICD-10-CM

## 2017-03-06 DIAGNOSIS — G43909 Migraine, unspecified, not intractable, without status migrainosus: Secondary | ICD-10-CM | POA: Diagnosis not present

## 2017-03-06 DIAGNOSIS — I10 Essential (primary) hypertension: Secondary | ICD-10-CM | POA: Diagnosis not present

## 2017-03-06 DIAGNOSIS — M545 Low back pain: Secondary | ICD-10-CM | POA: Diagnosis not present

## 2017-03-06 DIAGNOSIS — K219 Gastro-esophageal reflux disease without esophagitis: Secondary | ICD-10-CM | POA: Diagnosis not present

## 2017-03-06 DIAGNOSIS — M4802 Spinal stenosis, cervical region: Secondary | ICD-10-CM | POA: Diagnosis not present

## 2017-03-06 DIAGNOSIS — M48061 Spinal stenosis, lumbar region without neurogenic claudication: Secondary | ICD-10-CM | POA: Diagnosis not present

## 2017-03-06 DIAGNOSIS — M50122 Cervical disc disorder at C5-C6 level with radiculopathy: Secondary | ICD-10-CM | POA: Diagnosis not present

## 2017-03-06 DIAGNOSIS — M5010 Cervical disc disorder with radiculopathy, unspecified cervical region: Secondary | ICD-10-CM | POA: Diagnosis not present

## 2017-03-06 DIAGNOSIS — G35 Multiple sclerosis: Secondary | ICD-10-CM | POA: Diagnosis not present

## 2017-03-06 DIAGNOSIS — M5115 Intervertebral disc disorders with radiculopathy, thoracolumbar region: Secondary | ICD-10-CM | POA: Diagnosis not present

## 2017-03-06 DIAGNOSIS — M542 Cervicalgia: Secondary | ICD-10-CM | POA: Diagnosis not present

## 2017-03-06 DIAGNOSIS — M5412 Radiculopathy, cervical region: Secondary | ICD-10-CM

## 2017-03-06 DIAGNOSIS — F4310 Post-traumatic stress disorder, unspecified: Secondary | ICD-10-CM | POA: Diagnosis not present

## 2017-03-06 DIAGNOSIS — M4697 Unspecified inflammatory spondylopathy, lumbosacral region: Secondary | ICD-10-CM | POA: Diagnosis not present

## 2017-03-06 HISTORY — PX: RADIOLOGY WITH ANESTHESIA: SHX6223

## 2017-03-06 LAB — POCT PREGNANCY, URINE: Preg Test, Ur: NEGATIVE

## 2017-03-06 SURGERY — MRI WITH ANESTHESIA
Anesthesia: General

## 2017-03-06 MED ORDER — LACTATED RINGERS IV SOLN
INTRAVENOUS | Status: DC
  Administered 2017-03-06: 07:00:00 via INTRAVENOUS

## 2017-03-06 MED ORDER — ONDANSETRON HCL 4 MG/2ML IJ SOLN
INTRAMUSCULAR | Status: DC | PRN
  Administered 2017-03-06: 4 mg via INTRAVENOUS

## 2017-03-06 MED ORDER — LIDOCAINE 2% (20 MG/ML) 5 ML SYRINGE
INTRAMUSCULAR | Status: DC | PRN
  Administered 2017-03-06: 100 mg via INTRAVENOUS

## 2017-03-06 MED ORDER — FENTANYL CITRATE (PF) 250 MCG/5ML IJ SOLN
INTRAMUSCULAR | Status: DC | PRN
  Administered 2017-03-06: 50 ug via INTRAVENOUS

## 2017-03-06 MED ORDER — PROPOFOL 10 MG/ML IV BOLUS
INTRAVENOUS | Status: DC | PRN
  Administered 2017-03-06: 150 mg via INTRAVENOUS

## 2017-03-06 MED ORDER — SUCCINYLCHOLINE CHLORIDE 200 MG/10ML IV SOSY
PREFILLED_SYRINGE | INTRAVENOUS | Status: DC | PRN
  Administered 2017-03-06: 100 mg via INTRAVENOUS

## 2017-03-06 MED ORDER — MIDAZOLAM HCL 5 MG/5ML IJ SOLN
INTRAMUSCULAR | Status: DC | PRN
  Administered 2017-03-06: 4 mg via INTRAVENOUS

## 2017-03-06 MED ORDER — DEXAMETHASONE SODIUM PHOSPHATE 10 MG/ML IJ SOLN
INTRAMUSCULAR | Status: DC | PRN
  Administered 2017-03-06: 5 mg via INTRAVENOUS

## 2017-03-06 NOTE — Anesthesia Procedure Notes (Signed)
Procedure Name: Intubation Date/Time: 03/06/2017 8:02 AM Performed by: Imagene Riches, CRNA Pre-anesthesia Checklist: Patient identified, Emergency Drugs available, Suction available and Patient being monitored Patient Re-evaluated:Patient Re-evaluated prior to induction Oxygen Delivery Method: Circle System Utilized Preoxygenation: Pre-oxygenation with 100% oxygen Induction Type: IV induction Ventilation: Mask ventilation without difficulty Laryngoscope Size: Miller and 3 Grade View: Grade I Tube type: Oral Tube size: 7.0 mm Number of attempts: 1 Airway Equipment and Method: Stylet and Oral airway Placement Confirmation: ETT inserted through vocal cords under direct vision,  positive ETCO2 and breath sounds checked- equal and bilateral Secured at: 21 cm Tube secured with: Tape Dental Injury: Teeth and Oropharynx as per pre-operative assessment

## 2017-03-06 NOTE — Transfer of Care (Signed)
Immediate Anesthesia Transfer of Care Note  Patient: Emily [name]  Procedure(s) Performed: MRI WITH ANESTHESIA OF CERVICAL SPINE WITHOUT CONTRAST, MRI OF LUMBAR SPINE WITHOUT CONTRAST (N/A )  Patient Location: PACU  Anesthesia Type:General  Level of Consciousness: awake, alert  and oriented  Airway & Oxygen Therapy: Patient Spontanous Breathing and Patient connected to nasal cannula oxygen  Post-op Assessment: Report given to RN and Post -op Vital signs reviewed and stable  Post vital signs: Reviewed and stable  Last Vitals:  Vitals:   03/06/17 0634 03/06/17 0939  BP: (!) 149/95 139/82  Pulse: 80 91  Resp: 20 13  Temp: 36.4 C 36.5 C  SpO2: 99% 96%    Last Pain:  Vitals:   03/06/17 0939  TempSrc:   PainSc: 0-No pain      Patients Stated Pain Goal: 3 (47/09/29 [id number])  Complications: No apparent anesthesia complications

## 2017-03-06 NOTE — Anesthesia Postprocedure Evaluation (Signed)
Anesthesia Post Note  Patient: Emily [name]  Procedure(s) Performed: MRI WITH ANESTHESIA OF CERVICAL SPINE WITHOUT CONTRAST, MRI OF LUMBAR SPINE WITHOUT CONTRAST (N/A )     Patient location during evaluation: PACU Anesthesia Type: General Level of consciousness: awake and alert Pain management: pain level controlled Vital Signs Assessment: post-procedure vital signs reviewed and stable Respiratory status: spontaneous breathing, nonlabored ventilation and respiratory function stable Cardiovascular status: blood pressure returned to baseline and stable Postop Assessment: no apparent nausea or vomiting Anesthetic complications: no    Last Vitals:  Vitals:   03/06/17 1015 03/06/17 1025  BP:  (!) 140/93  Pulse: 91 87  Resp: 18 15  Temp: 36.8 C   SpO2: 98% 98%    Last Pain:  Vitals:   03/06/17 1015  TempSrc:   PainSc: 0-No pain                 Jontavius Rabalais,W. EDMOND

## 2017-03-06 NOTE — Telephone Encounter (Signed)
Ok to give 20 tablets a month of Reglan instead of 10 tabs. For the migraines, pls check on Botox, maybe she can still continue getting it? In the meantime, does she want to increase to Topamax to 100mg  BID for the migraines? If yes, pls send in new Rx, thanks

## 2017-03-07 ENCOUNTER — Encounter (HOSPITAL_COMMUNITY): Payer: Self-pay | Admitting: Radiology

## 2017-03-07 ENCOUNTER — Other Ambulatory Visit: Payer: Self-pay

## 2017-03-07 MED ORDER — METOCLOPRAMIDE HCL 10 MG PO TABS: ORAL_TABLET | ORAL | 0 refills | Status: DC

## 2017-03-07 NOTE — Telephone Encounter (Signed)
LMOM relaying message below.  Asked for return call in regards to Topamax.  Rx sent to pt listed preferred pharmacy

## 2017-03-10 ENCOUNTER — Other Ambulatory Visit: Payer: Self-pay

## 2017-03-10 DIAGNOSIS — G43109 Migraine with aura, not intractable, without status migrainosus: Secondary | ICD-10-CM

## 2017-03-10 MED ORDER — TOPIRAMATE 100 MG PO TABS: ORAL_TABLET | ORAL | 3 refills | Status: DC

## 2017-03-11 DIAGNOSIS — Z8669 Personal history of other diseases of the nervous system and sense organs: Secondary | ICD-10-CM | POA: Diagnosis not present

## 2017-03-11 DIAGNOSIS — Z6841 Body Mass Index (BMI) 40.0 and over, adult: Secondary | ICD-10-CM | POA: Diagnosis not present

## 2017-03-11 DIAGNOSIS — M47812 Spondylosis without myelopathy or radiculopathy, cervical region: Secondary | ICD-10-CM | POA: Diagnosis not present

## 2017-03-17 NOTE — H&P (Signed)
H&P required for sedated MRI  H&P obtained 02/04/2017  Referring Physician:  Milinda Pointer, MD [place], [place] [address]  Primary Physician:  Charlotte Crumb, MD  Chief Complaint: Neck pain and back pain  History of Present Illness: Emily [name] is a 37 y.o. female non-smoker with a history of multiple sclerosis and conversion disorder who presents with the chief complaint of neck pain and back pain.  Patient states symptoms have been going on since 2006 when she was diagnosed with MS.  During that time patient was on many medications without complete relief of symptoms.  She became pregnant and was taken off all medications and symptoms are relieved with pregnancy.  It is been approximately 1 year since she has given birth and she is noticing a gradual return of symptoms.  He has been receiving injections in her mid back that is been improving her lower back.   Neck pain primarily aching on the left side and associated with a radiating, shooting pain medial aspect of upper extremities bilaterally.  Also complains of burning sensation in hands bilaterally that has been worsening over the last couple of weeks.  Back pain is aching diffusely throughout the low lumbar region and associated with left posterior lower extremity sharp/shooting pain that goes through the foot. Symptoms are aggravated with bending, twisting, changing positions, lifting; relieved with rest, heat, medications. Patient states she has received ESI in the mid back and they do help with symptoms.  Injection was 01/23/2017. Patient has received physical therapy for current complaints.  Last session was 2018/2019.  Denies bladder bowel dysfunction, saddle paresthesia, right lower extremity symptoms, weakness.  Past Spinal Surgery: Denies  Emily [name] does not have symptoms of cervical myelopathy.  The symptoms are causing a significant impact on the patient's life.   Review of  Systems:  A 10 point review of systems is negative, except for the pertinent positives and negatives detailed in the HPI.  Past Medical History:     Past Medical History:  Diagnosis Date  . Anxiety, unspecified   . Asthma without status asthmaticus, unspecified   . Chronic back pain, unspecified   . Chronic pain   . Conversion disorder   . Depression, unspecified   . GERD (gastroesophageal reflux disease)   . Hypertension   . JC virus antibody positive   . Migraines   . Multiple sclerosis (CMS-HCC)   . Ovarian cyst   . Panic disorder   . Perforated bowel (CMS-HCC) 2009  . Pseudoseizures   . PTSD (post-traumatic stress disorder), unspecified   . Seizures (CMS-HCC)   . Urinary urgency     Past Surgical History:      Past Surgical History:  Procedure Laterality Date  . abdominal surgery    . APPENDECTOMY    . CESAREAN SECTION  07/12/2015  . COLONOSCOPY  01/29/2017  . colostomy closure  04/2007  . exploration of scar of abdomen and repair of defect  01/21/2011  . extremity cyst excision  1994  . LAPAROSCOPIC BOWEL RESECTION  01/2007  . REPAIR INCISIONAL HERNIA LAPAROSCOPIC  02/16/2016   insertion of mesh  . TOOTH EXTRACTION Left 10/2016    Allergies:      Allergies as of 02/04/2017 - Reviewed 02/04/2017  Allergen Reaction Noted  . Amitriptyline Other (See Comments) 01/15/2011  . Baclofen Hives and Shortness Of Breath 01/15/2011  . Duloxetine hcl Rash and Shortness Of Breath 01/15/2011  . Gabapentin Rash and Shortness Of Breath 01/15/2011  .  Hydrocodone-acetaminophen Hives, Nausea And Vomiting, and Other (See Comments) 01/21/2011  . Magnesium salicylate Hives and Itching 04/17/2013  . Monosodium glutamate Anaphylaxis 04/17/2013  . Other Other (See Comments), Rash, and Shortness Of Breath 01/15/2011  . Tizanidine Hives 04/17/2013  . Alprazolam Other (See Comments) 01/15/2011  . Rizatriptan Nausea And Vomiting and Other (See  Comments) 08/17/2013  . Tramadol Other (See Comments) 12/04/2013  . Adhesive tape-silicones Rash 24/82/5003  . Ketorolac tromethamine Nausea 10/09/2013  . Lamotrigine Rash 08/17/2013    Medications: EncounterMedications        Outpatient Encounter Medications as of 02/04/2017  Medication Sig Dispense Refill  . albuterol 90 mcg/actuation inhaler Inhale 2 inhalations into the lungs every 6 (six) hours as needed    . calcium carbonate-vitamin D3 600 mg (1,500 mg)-2,500 unit Cap Take 1 capsule by mouth daily with breakfast    . 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 divalproex (DEPAKOTE) 500 MG DR tablet Take 500 mg by mouth nightly For migraine with aura and without status migrainosus, not intractable    . fingolimod (GILENYA) 0.5 mg Take 1 capsule by mouth once daily    . FLUoxetine (PROZAC) 40 MG capsule Take 2 capsules by mouth once daily    . methocarbamol (ROBAXIN) 750 MG tablet Take 1 tablet by mouth 2 (two) times daily    . metoclopramide (REGLAN) 10 MG tablet Take 10 mg by mouth once daily as needed    . pregabalin (LYRICA) 50 MG capsule Take 50 mg by mouth 3 (three) times daily    . SUMAtriptan (IMITREX) 100 MG tablet Take 100 mg by mouth every 2 (two) hours as needed    . topiramate (TOPAMAX) 50 MG tablet Take by mouth as directed 1 tablet in AM, 2 tablets in PM    . busPIRone (BUSPAR) 15 MG tablet Take 1 tablet by mouth 3 (three) times daily  2  . diazePAM (VALIUM) 5 MG tablet Take 1 tablet by mouth once daily as needed FOR PANIC ATTACKS LEADING TO SEIZURES  3  . EPINEPHrine (EPIPEN) 0.3 mg/0.3 mL pen injector Inject 0.3 mg into the muscle as needed    . etonogestrel (NEXPLANON) 68 mg implant Inject subcutaneously    . ibuprofen (ADVIL,MOTRIN) 200 MG tablet Take 800 mg by mouth every 6 (six) hours as needed for Headache or  moderate pain (4-6)    . loratadine (CLARITIN) 10 mg tablet Take 10 mg by mouth once daily    . multivitamin capsule Take 1 capsule by mouth once daily    . ranitidine (ZANTAC) 300 MG tablet Take 300 mg by mouth nightly     No facility-administered encounter medications on file as of 02/04/2017.       Social History: Social History        Tobacco Use  . Smoking status: Former Smoker    Last attempt to quit: 12/13/2014    Years since quitting: 2.1  . Smokeless tobacco: Never Used  Substance Use Topics  . Alcohol use: Yes    Frequency: Monthly or less    Comment: about once per year  . Drug use: Not on file    Family Medical History: No family history on file.  Physical Examination: Ht 162.6 cm (5\' 4" )   Wt (!) 120 kg (264 lb 9.6 oz)   BMI 45.42 kg/m  Body mass index is 45.[place]  kg/m. Body surface area is 2.33 meters squared. Gen. appearance: Patient is well-developed, well-nourished, in no apparent distress. Alert and cooperative.  Head: Normocephalic, atraumatic Heart: Normal, regular rate Lungs: Breathing without difficulty Extremities: No edema noted in lower extremities bilaterally Skin: No abnormal skin lesions noted on limited exam of exposed skin Psychiatric: Patient is non-anxious, normal affect  NEUROLOGIC EXAM: Mental status: Alert and orientation to person, place, and time. Speech: fluent and clear Cranial nerves:  II: Pupils equal, round, and reactive to light. no ptosis V/VII:no evidence of facial droop  Motor: No pronator drift      Strength: Side Biceps Triceps Deltoid Interossei Grip Wrist Ext. Wrist Flex.  R 5 5 5 5 5 5 5   L 5 5 5 5 5 5 5    Side Iliopsoas Quads Hamstring Plantar Flexion Dorsiflexion  Extensor Hallicus Longus  R 4+ 4+ 4+ 4+ 4+ Unable to perform  L 5 5 5 5 5 5   0 = no contraction 1 = visible muscle twitch but no movement of the joint 2 = weak contraction insufficient to overcome gravity 3 = weak  contraction able to overcome gravity but no additional resistance 4 = weak contraction able to overcome some resistance but not full resistance 5 = normal; able to overcome full resistance Sensory: Decreased sensation right lateral thigh Reflexes: Upper extremity reflexes are 1+ and symmetric at the biceps, triceps, and brachioradialis. Lower extremity reflexes are 1+ and symmetric at the patella and achilles. Plantar reflex is normal. Clonus is absent. Hoffman's sign is absent.  Gait:normal, unable to perform tandem gait without assistance.  ROM of spine: Full.  Pain elicited on left when turning left.  Pain elicited lumbar spine on extension  palpation of spine:  Non-tender   Medical Decision Making  Imaging: EXAM: THORACIC SPINE 2 VIEWS  COMPARISON:11/09/2013  FINDINGS: Alignment of the thoracic spine is within normal limits. Vertebral body heights are maintained. Degenerative endplate changes at C5 through C7. Normal alignment at the cervicothoracic junction and thoracolumbar junction. Degenerative endplate changes in lower thoracic spine.  IMPRESSION: Degenerative changes without acute abnormality.   Electronically Signed By: Herold Harms M.D. On: 08/02/2016 12:41 EXAM: LUMBAR SPINE - COMPLETE WITH BENDING VIEWS  COMPARISON:Prior CT abdomen/ pelvis 05/26/2016  FINDINGS: No evidence of fracture or acute malalignment. The vertebral body heights are maintained. There is exaggerated lumbar lordosis. Mild L5-S1 facet arthropathy. No lytic or blastic osseous lesion. The visualized abdominal bowel gas pattern is normal.  IMPRESSION: 1. No acute fracture or malalignment. 2. Mildly exaggerated lumbar lordosis with associated L5-S1 facet arthropathy.   Electronically Signed By: HeathMcCullough M.D. On: 08/02/2016 12:40 Assessment and Plan: Ms. Silveria is a pleasant 37 y.o. female with chronic pain that is been associated to multiple sclerosis since 2006.   Symptoms were temporary relieved with pregnancy but have been returning gradually over the last year.  Obtained cervical flexion and extension x-rays today for further evaluation. Recommended cervical and lumbar MRI for further evaluation of potential radiculopathy.  Patient agreeable but states that she experienced severe anxiety last time she had an MRI performed which were resulted in seizures.  Requests full sedation for image completion. Patient has exhausted conservative management of physical therapy and ESI. Contact patient after results of MRI are available for review and discuss options at that time. Patient agreeable to plan.  All questions and concerns were addressed.  Patient advised to contact office if any other additional questions or concerns arise.  Thank you for involving me in the  care of this patient. I will keep you apprised of the patient's progress.   This note was partially dictated using voice recognition software, so please excuse any errors that were not corrected.   Marin Olp PA-C Dept. of Neurosurgery

## 2017-03-19 ENCOUNTER — Telehealth: Payer: Self-pay | Admitting: Pain Medicine

## 2017-03-19 ENCOUNTER — Telehealth: Payer: Self-pay | Admitting: *Deleted

## 2017-03-19 DIAGNOSIS — R319 Hematuria, unspecified: Secondary | ICD-10-CM | POA: Diagnosis not present

## 2017-03-19 DIAGNOSIS — Z299 Encounter for prophylactic measures, unspecified: Secondary | ICD-10-CM | POA: Diagnosis not present

## 2017-03-19 DIAGNOSIS — M549 Dorsalgia, unspecified: Secondary | ICD-10-CM | POA: Diagnosis not present

## 2017-03-19 DIAGNOSIS — N132 Hydronephrosis with renal and ureteral calculous obstruction: Secondary | ICD-10-CM | POA: Diagnosis not present

## 2017-03-19 DIAGNOSIS — N201 Calculus of ureter: Secondary | ICD-10-CM | POA: Diagnosis not present

## 2017-03-19 DIAGNOSIS — Q232 Congenital mitral stenosis: Secondary | ICD-10-CM | POA: Diagnosis not present

## 2017-03-19 DIAGNOSIS — Z6841 Body Mass Index (BMI) 40.0 and over, adult: Secondary | ICD-10-CM | POA: Diagnosis not present

## 2017-03-19 DIAGNOSIS — R109 Unspecified abdominal pain: Secondary | ICD-10-CM | POA: Diagnosis not present

## 2017-03-19 DIAGNOSIS — N1330 Unspecified hydronephrosis: Secondary | ICD-10-CM | POA: Diagnosis not present

## 2017-03-19 DIAGNOSIS — I10 Essential (primary) hypertension: Secondary | ICD-10-CM | POA: Diagnosis not present

## 2017-03-19 NOTE — Telephone Encounter (Signed)
Patient called stating she is passing a kidney stone and would like advice on what to do about pain. Spoke with Clent Ridges. She advised patient to see primary care or kidney physician for acute pain. Patient stated understanding and will do that.

## 2017-03-19 NOTE — Telephone Encounter (Signed)
Called patient and she stated that she went to her PCP today and was sent for a CT Scan. It was noted that she had kidney stones and was given 15 tabs of Tylenol #3 to last 5 days.

## 2017-03-20 ENCOUNTER — Other Ambulatory Visit (HOSPITAL_COMMUNITY): Payer: Self-pay

## 2017-03-20 MED ORDER — HYDROXYZINE PAMOATE 25 MG PO CAPS: 25.0000 mg | ORAL_CAPSULE | Freq: Two times a day (BID) | ORAL | 2 refills | Status: DC

## 2017-03-23 NOTE — Progress Notes (Signed)
Patient's Name: Emily [name]  MRN: [id number]  Referring Provider: Monico Blitz, MD  DOB: 1980/07/24  PCP: Monico Blitz, MD  DOS: 03/24/2017  Note by: Gaspar Cola, MD  Service setting: Ambulatory outpatient  Specialty: Interventional Pain Management  Location: [facility] (AMB) Pain Management Facility    Patient type: Established   Primary Reason(s) for Visit: Encounter for post-procedure evaluation of chronic illness with mild to moderate exacerbation CC: Neck Pain and Back Pain (low)  HPI  Emily [name] is a 37 y.o. year old, female patient, who comes today for a post-procedure evaluation. She has Depression; Multiple sclerosis ([place]); Migraine; Seizure disorder ([place]); Smoker; Obesity; HTN (hypertension); Perforated bowel ([place]); Conversion disorder with attacks or seizures, persistent, with psychological stressor; Migraine with aura and without status migrainosus, not intractable; Generalized anxiety disorder; Right optic neuritis; Difficult intravenous access; PTSD (post-traumatic stress disorder); Conversion disorder with seizures or convulsions; Severe episode of recurrent major depressive disorder, without psychotic features ([place]); GAD (generalized anxiety disorder); Postpartum endometritis; Paresthesia; Chronic pain syndrome; Substance abuse ([place]); Carpal tunnel syndrome (Tertiary Area of Pain) (Bilateral) (L>R); Incisional hernia; Incarcerated incisional hernia; DDD (degenerative disc disease), cervical; Laryngopharyngeal reflux; Tonsillitis; Opiate use; Chronic neck pain (Primary Area of Pain) (Bilateral) (R>L); Chronic low back pain (Secondary Area of Pain) (Bilateral) (R>L); Chronic knee pain (Fifth Area of Pain) (Left); Midline thoracic back pain; Chronic lower extremity pain (Bilateral); Major depressive disorder, single episode; Vitamin D deficiency; Chronic upper back pain (Primary Area of Pain) (Bilateral) (R>L); Chronic hand pain (Tertiary Area of Pain) (Bilateral) (L>R); Chronic wrist pain  (Tertiary Area of Pain) (Bilateral) (L>R); Chronic foot/toes pain (Fourth Area of Pain) (Bilateral) (R>L); Chronic upper extremity pain (Secondary Area of Pain) (Bilateral) (L>R); Long term prescription benzodiazepine use; 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); Foraminal stenosis of cervical region (B) (C6-7); Chronic CNS demyelinating disease (MS); DDD (degenerative disc disease), thoracic; DDD (degenerative disc disease), lumbar; Lumbar facet arthropathy; Lumbar facet syndrome (Bilateral) (R>L); Cervical radiculitis (Bilateral); Chronic musculoskeletal pain; Neurogenic pain; Cervicogenic headache; Occipital headache; Trigger point with back pain (Left); History of fainting (vasovagal); History of vasovagal syncope; Acute left-sided low back pain without sciatica; and Spondylosis without myelopathy or radiculopathy, lumbosacral region on their problem list. Her primarily concern today is the Neck Pain and Back Pain (low)  Pain Assessment: Location:   Neck(back) Radiating: neck pain does not radiate(back pain is not radiating) Onset: More than a month ago Duration: Chronic pain Quality: Aching, Sharp Severity: 8 /10 (self-reported pain score)  Note: Reported level is inconsistent with clinical observations. Clinically the patient looks like a 2/10 A 2/10 is viewed as "Mild to Moderate" and described as noticeable and distracting. Impossible to hide from other people. More frequent flare-ups. Still possible to adapt and function close to normal. It can be very annoying and may have occasional stronger flare-ups. With discipline, patients may get used to it and adapt. Information on the proper use of the pain scale provided to the patient today. When using our objective Pain Scale, levels between 6 and 10/10 are said to belong in an emergency room, as it progressively worsens from a 6/10, described as severely limiting, requiring emergency care not usually available at an outpatient pain management  facility. At a 6/10 level, communication becomes difficult and requires great effort. Assistance to reach the emergency department may be required. Facial flushing and profuse sweating along with potentially dangerous increases in heart rate and blood pressure will be evident. Timing: Constant Modifying factors: heat  The patient comes into the clinic today for follow-up after a left-sided lumbar facet block #1 under fluoroscopic guidance and IV sedation.  She indicates having seen her neurosurgeon.  Today we reviewed the  Cervical MRI and the information provided to her by the neurosurgeon.  She is concerned about the risks of the surgery but I have explained to her that based on the cervical compression observed on the MRI were no anterior-posterior evidence of fluid is seen on the T2 imaging, this would suggest that the therapies and medications that I have to offer would not be very successful in providing her with good relief of the pain.  She is currently doing well with regards to her lower back, but it is my professional opinion that she needs to go back and seriously consider the possibility of an anterior cervical discectomy to decompress the affected area.  Emily [name] comes in today for post-procedure evaluation after the treatment done on 03/19/2017.  Further details on both, my assessment(s), as well as the proposed treatment plan, please see below.  Post-Procedure Assessment  03/19/2017 Procedure: Diagnostic left-sided lumbar facet block #1 under fluoroscopic guidance and IV sedation Pre-procedure pain score:  7/10 Post-procedure pain score: 0/10 (100% relief) Influential Factors: BMI: 45.49 kg/m Intra-procedural challenges: None observed.         Assessment challenges: None detected.              Reported side-effects: None.        Post-procedural adverse reactions or complications: None reported         Sedation: Sedation provided. When no sedatives are used, the analgesic levels  obtained are directly associated to the effectiveness of the local anesthetics. However, when sedation is provided, the level of analgesia obtained during the initial 1 hour following the intervention, is believed to be the result of a combination of factors. These factors may include, but are not limited to: 1. The effectiveness of the local anesthetics used. 2. The effects of the analgesic(s) and/or anxiolytic(s) used. 3. The degree of discomfort experienced by the patient at the time of the procedure. 4. The patients ability and reliability in recalling and recording the events. 5. The presence and influence of possible secondary gains and/or psychosocial factors. Reported result: Relief experienced during the 1st hour after the procedure: 100 % (Ultra-Short Term Relief)            Interpretative annotation: Clinically appropriate result. Analgesia during this period is likely to be Local Anesthetic and/or IV Sedative (Analgesic/Anxiolytic) related.          Effects of local anesthetic: The analgesic effects attained during this period are directly associated to the localized infiltration of local anesthetics and therefore cary significant diagnostic value as to the etiological location, or anatomical origin, of the pain. Expected duration of relief is directly dependent on the pharmacodynamics of the local anesthetic used. Long-acting (4-6 hours) anesthetics used.  Reported result: Relief during the next 4 to 6 hour after the procedure: 100 % (Short-Term Relief)            Interpretative annotation: Clinically appropriate result. Analgesia during this period is likely to be Local Anesthetic-related.          Long-term benefit: Defined as the period of time past the expected duration of local anesthetics (1 hour for short-acting and 4-6 hours for long-acting). With the possible exception of prolonged sympathetic blockade from the local anesthetics, benefits during this period are typically attributed  to, or  associated with, other factors such as analgesic sensory neuropraxia, antiinflammatory effects, or beneficial biochemical changes provided by agents other than the local anesthetics.  Reported result: Extended relief following procedure: 75 %(ongoing) (Long-Term Relief)            Interpretative annotation: Clinically appropriate result. Good relief. No permanent benefit expected. Inflammation plays a part in the etiology to the pain.          Current benefits: Defined as reported results that persistent at this point in time.   Analgesia: 75 %            Function: Somewhat improved ROM: Somewhat improved Interpretative annotation: Ongoing benefit. Therapeutic success. Effective therapeutic approach.          Interpretation: Results would suggest a successful diagnostic intervention.                  Plan:  Set up procedure as a PRN palliative treatment option for this patient.                Laboratory Chemistry  Inflammation Markers (CRP: Acute Phase) (ESR: Chronic Phase) Lab Results  Component Value Date   CRP 2.0 07/23/2016   ESRSEDRATE 14 07/23/2016   LATICACIDVEN 0.9 02/15/2016                         Rheumatology Markers Lab Results  Component Value Date   LABURIC 3.5 02/15/2015                Renal Function Markers Lab Results  Component Value Date   BUN 10 03/03/2017   CREATININE 0.93 03/03/2017   GFRAA >60 03/03/2017   GFRNONAA >60 03/03/2017                 Hepatic Function Markers Lab Results  Component Value Date   AST 12 07/23/2016   ALT 10 07/23/2016   ALBUMIN 4.0 07/23/2016   ALKPHOS 88 07/23/2016   LIPASE 41 07/12/2015                 Electrolytes Lab Results  Component Value Date   NA 137 03/03/2017   K 3.3 (L) 03/03/2017   CL 105 03/03/2017   CALCIUM 8.8 (L) 03/03/2017   MG 2.1 07/23/2016                        Neuropathy Markers Lab Results  Component Value Date   VITAMINB12 456 07/23/2016   HIV NONREACTIVE 05/22/2015                  Bone Pathology Markers Lab Results  Component Value Date   25OHVITD1 19 (L) 07/23/2016   25OHVITD2 <1.0 07/23/2016   25OHVITD3 19 07/23/2016                         Coagulation Parameters Lab Results  Component Value Date   PLT 335 03/03/2017   DDIMER 0.33 10/29/2013                 Cardiovascular Markers Lab Results  Component Value Date   TROPONINI <0.03 05/23/2014   HGB 15.6 (H) 03/03/2017   HCT 45.6 03/03/2017                 CA Markers No results found for: CEA, CA125, LABCA2               Note:  Lab results reviewed.  Recent Diagnostic Imaging Results  MR LUMBAR SPINE WO CONTRAST CLINICAL DATA:  37 year old female with chronic multiple sclerosis. Lumbar back pain radiating to the left leg.  Study performed under general anesthesia.  EXAM: MRI LUMBAR SPINE WITHOUT CONTRAST  TECHNIQUE: Multiplanar, multisequence MR imaging of the lumbar spine was performed. No intravenous contrast was administered.  COMPARISON:  CT Abdomen and Pelvis 10/30/2016 and earlier. Thoracic spine radiographs 12/06/2013.  FINDINGS: Segmentation: Evidence of 12 full size ribs on the 2015 study. Therefore lumbar segmentation is normal with vestigial ribs or unfused ossification centers at the L1 level.  Alignment:  Preserved lumbar lordosis.  No spondylolisthesis.  Vertebrae: Mild chronic T12 superior endplate deformity or Schmorl's node is without marrow edema. Background bone marrow signal is within normal limits. No marrow edema or evidence of acute osseous abnormality. Intact visible sacrum and SI joints.  Conus medullaris and cauda equina: Conus extends to the L1 level. The visible lower thoracic spinal cord and conus appear normal. Cauda equina appear normal.  Paraspinal and other soft tissues: Negative; partially visible physiologic appearing right ovarian cyst (series 4, image 1).  Disc levels:  No lower thoracic spinal stenosis.  T12-L1: Chronic disc  calcification. Questionable vacuum disc. Anterior disc bulging and endplate spurring without stenosis.  L1-L2: Mild to moderate facet hypertrophy greater on the right. No stenosis.  L2-L3:  Negative.  L3-L4: Mild to moderate facet hypertrophy. Trace degenerative appearing facet joint fluid. No stenosis.  L4-L5: Moderate to severe bilateral facet hypertrophy with trace degenerative joint fluid (series 6 image 29). No stenosis.  L5-S1: Severe bilateral facet hypertrophy. Chronic vacuum facet here on the prior CT. Left side degenerative facet joint fluid today (series 6, image 35). Associated mild left L5 neural foraminal stenosis.  IMPRESSION: 1. No acute osseous abnormality and no lumbar disc degeneration. No lumbar spinal stenosis. 2. Advanced widespread lumbar facet arthropathy, severe in the lower lumbar spine and maximal on the left side at L5-S1. There is associated mild left L5 neural foraminal stenosis (query radiculitis), but facet disease alone also can be a source of lumbar back pain which sometimes radiates. 3. Chronic T12-L1 disc degeneration without stenosis.  Electronically Signed   By: Genevie Ann M.D.   On: 03/06/2017 10:29 MR CERVICAL SPINE WO CONTRAST CLINICAL DATA:  37 year old female with chronic multiple sclerosis. The patient reports new and different severe neck pain radiating to the left arm.  Study performed under general anesthesia.  EXAM: MRI CERVICAL SPINE WITHOUT CONTRAST  TECHNIQUE: Multiplanar, multisequence MR imaging of the cervical spine was performed. No intravenous contrast was administered.  COMPARISON:  Cervical spine MRI 07/30/2015, 10/15/2013. Brain MRI 10/02/2013.  FINDINGS: Alignment: Chronic straightening of cervical lordosis. Stable vertebral height and alignment since 2015.  Vertebrae: Patchy marrow edema in the C6 vertebral body, mostly near the inferior endplate, appears degenerative in nature and is new since 2017. No  other marrow edema or acute osseous abnormality.  Cord: Subtle chronic cervical spinal cord STIR heterogeneity at the C2, C3, and C4 levels (series 3, image 7), appears stable since 2015.  There is degenerative spinal cord mass effect at C5-C6 and C6-C7. No associated cord signal abnormality.  Questionable upper thoracic spinal cord STIR hyperintense lesion at T2-T3 (series 3, image 8).  Posterior Fossa, vertebral arteries, paraspinal tissues:  Negative visible posterior fossa.  Large body habitus. Major vascular flow voids in the neck are preserved.  The patient is intubated, with a small volume of fluid in the  pharynx.  Disc levels:  C2-C3: Chronic moderate to severe facet hypertrophy greater on the left. No spinal stenosis. Mild left C3 foraminal stenosis is stable.  C3-C4: Moderate bilateral facet hypertrophy. No significant stenosis.  C4-C5: Mild disc bulge or subtle central disc protrusion. Mild facet hypertrophy. No stenosis.  C5-C6: Chronic disc and endplate degeneration. Broad-based leftward chronic disc protrusion with spinal stenosis and mild spinal cord mass effect. Superimposed circumferential disc bulge and endplate spurring. This level appears stable to mildly improved since 2015 (series 6, image 18). Mild left C6 foraminal stenosis is stable.  C6-C7: Chronic disc and endplate degeneration. A broad-based leftward disc protrusion is chronic but appears increased from the previous MRIs, best seen on series 5, image 26. Spinal stenosis with left hemi cord mass effect occurs. Disc material also involves the proximal left C7 neural foramen with at least moderate stenosis which appears increased since 2015. There is mild to moderate contralateral right neural foraminal stenosis.  C7-T1: Mild endplate spurring. Mild to moderate facet hypertrophy greater on the left. This level has progressed since 2015 but there is no significant stenosis.  No upper thoracic  spinal stenosis.  IMPRESSION: 1. Chronic but progressed C6-C7 disc and endplate degeneration. Patchy C6 vertebral body marrow edema is new and appears degenerative in nature. Leftward disc herniation appears increased, with associated spinal stenosis, spinal cord mass effect, and moderate bilateral neural foraminal stenosis. Query left C7 radiculitis. 2. Advanced chronic disc and endplate degeneration also at C5-C6, but stable to mildly improved since 2015. Chronic mild spinal stenosis and spinal cord mass effect at that level. 3. Subtle chronic upper cervical spinal cord signal abnormality compatible with chronic multiple sclerosis. Questionable small upper thoracic cord lesion at T2-T3.  Electronically Signed   By: Genevie Ann M.D.   On: 03/06/2017 10:22  Complexity Note: I personally reviewed the fluoroscopic imaging of the procedure.                        Meds   Current Outpatient Medications:  .  albuterol (PROVENTIL HFA;VENTOLIN HFA) 108 (90 Base) MCG/ACT inhaler, Inhale 2 puffs into the lungs every 6 (six) hours as needed for wheezing or shortness of breath. (Patient taking differently: Inhale 2 puffs into the lungs every 4 (four) hours as needed for wheezing or shortness of breath. ), Disp: 1 Inhaler, Rfl: 0 .  amLODipine (NORVASC) 5 MG tablet, Take 5 mg by mouth every evening., Disp: , Rfl: 2 .  busPIRone (BUSPAR) 15 MG tablet, Take 1 tablet (15 mg total) by mouth 3 (three) times daily., Disp: 90 tablet, Rfl: 3 .  Calcium Carb-Cholecalciferol (CALCIUM PLUS D3 ABSORBABLE) [phone] MG-UNIT CAPS, Take 1 capsule by mouth daily with breakfast., Disp: 90 capsule, Rfl: 0 .  diazepam (VALIUM) 5 MG tablet, Take 1 tablet (5 mg total) by mouth daily. Take 1 tablet as needed for panic attacks leading to seizures., Disp: 30 tablet, Rfl: 3 .  diphenhydrAMINE (SOMINEX) 25 MG tablet, Take 2 tablets (50 mg total) by mouth at bedtime as needed for sleep. (Patient taking differently: Take 50 mg by  mouth at bedtime. ), Disp: 60 tablet, Rfl: 3 .  divalproex (DEPAKOTE) 500 MG DR tablet, Take 1 tablet (500 mg total) by mouth at bedtime., Disp: 90 tablet, Rfl: 3 .  EPINEPHrine (EPIPEN) 0.3 mg/0.3 mL SOAJ injection, Inject 0.3 mg into the muscle as needed (allergic reaction). Reported on 03/28/2015, Disp: , Rfl:  .  etonogestrel (NEXPLANON) 68 MG  IMPL implant, 1 each by Subdermal route once., Disp: , Rfl:  .  FLUoxetine (PROZAC) 40 MG capsule, Take 2 capsules (80 mg total) by mouth daily., Disp: 60 capsule, Rfl: 3 .  GILENYA 0.5 MG CAPS, TAKE 1 CAPSULE BY MOUTH EVERY DAY, Disp: 30 capsule, Rfl: 10 .  hydrOXYzine (VISTARIL) 25 MG capsule, Take 1 capsule (25 mg total) by mouth 2 (two) times daily., Disp: 60 capsule, Rfl: 2 .  ibuprofen (ADVIL,MOTRIN) 600 MG tablet, Take 600 mg by mouth daily as needed for moderate pain., Disp: , Rfl:  .  loratadine (CLARITIN) 10 MG tablet, Take 10 mg by mouth daily as needed for allergies., Disp: , Rfl:  .  methocarbamol (ROBAXIN) 750 MG tablet, Take 1 tablet (750 mg total) by mouth 2 (two) times daily., Disp: 180 tablet, Rfl: 0 .  metoCLOPramide (REGLAN) 10 MG tablet, Take as needed, Disp: 10 tablet, Rfl: 0 .  Multiple Vitamins-Minerals (MULTIVITAMIN ADULT PO), Take 1 tablet by mouth daily. , Disp: , Rfl:  .  OnabotulinumtoxinA (BOTOX IJ), Inject as directed every 3 (three) months., Disp: , Rfl:  .  phentermine 37.5 MG capsule, Take 37.5 mg by mouth every morning., Disp: , Rfl:  .  pregabalin (LYRICA) 75 MG capsule, Take 1 capsule (75 mg total) by mouth 3 (three) times daily., Disp: 90 capsule, Rfl: 2 .  ranitidine (ZANTAC) 300 MG tablet, Take 300 mg by mouth at bedtime. , Disp: , Rfl:  .  SUMAtriptan (IMITREX) 100 MG tablet, Take 1 tablet (100 mg total) by mouth every 2 (two) hours as needed for migraine. May repeat in 2 hours if headache persists or recurs., Disp: 9 tablet, Rfl: 6 .  topiramate (TOPAMAX) 100 MG tablet, Take 1 tablet twice daily, Disp: 180 tablet,  Rfl: 3 .  HYDROcodone-acetaminophen (NORCO/VICODIN) 5-325 MG tablet, TAKE ONE TABLET EVERY 8 HOURS AS NEEDED, Disp: , Rfl: 0  Current Facility-Administered Medications:  .  metoCLOPramide (REGLAN) 10 mg in dextrose 5 % 50 mL IVPB, 10 mg, Intravenous, Once, Delice Lesch, Lezlie Octave, MD  Facility-Administered Medications Ordered in Other Visits:  .  metoCLOPramide (REGLAN) injection 10 mg, 10 mg, Intravenous, Once, Milton Ferguson, MD  ROS  Constitutional: Denies any fever or chills Gastrointestinal: No reported hemesis, hematochezia, vomiting, or acute GI distress Musculoskeletal: Denies any acute onset joint swelling, redness, loss of ROM, or weakness Neurological: No reported episodes of acute onset apraxia, aphasia, dysarthria, agnosia, amnesia, paralysis, loss of coordination, or loss of consciousness  Allergies  Ms. Brinkman is allergic to baclofen; cymbalta [duloxetine hcl]; gabapentin; monosodium glutamate; other; amitriptyline; magnesium salicylate; rizatriptan; tizanidine; vicodin [hydrocodone-acetaminophen]; adhesive [tape]; lamotrigine; toradol [ketorolac tromethamine]; and tramadol.  PFSH  Drug: Ms. Baley  reports that she does not use drugs. Alcohol:  reports that she does not drink alcohol. Tobacco:  reports that she quit smoking about 20 months ago. Her smoking use included cigarettes. She has a 2.50 pack-year smoking history. she has never used smokeless tobacco. Medical:  has a past medical history of Anxiety, Asthma (as a child), Chronic back pain, Chronic pain, Conversion disorder, Depression, GERD (gastroesophageal reflux disease), Headache(784.0), HTN (hypertension) (02/27/2015), JC virus antibody positive, Migraines, MS (multiple sclerosis) ([place]), Multiple sclerosis ([place]), Ovarian cyst, Panic attack, Perforated bowel ([place]) (2009), Pseudoseizures, PTSD (post-traumatic stress disorder), S/P emergency C-section, and Urinary urgency. Surgical: Ms. 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 Family: family history includes Arthritis in her father; Cancer in her maternal grandfather and maternal grandmother; Diabetes in her father and mother; Hypertension in her father and mother.  Constitutional Exam  General appearance: Well nourished, well developed, and well hydrated. In no apparent acute distress Vitals:   03/24/17 0807  BP: 132/81  Pulse: (!) 104  Resp: 18  Temp: 98.6 F (37 C)  SpO2: 100%  Weight: 265 lb (120.2 kg)  Height: 5' 4"  (1.626 m)   BMI Assessment: Estimated body mass index is 45.49 kg/m as calculated from the following:   Height as of this encounter: 5' 4"  (1.626 m).   Weight as of this encounter: 265 lb (120.2 kg).  BMI interpretation table: BMI level Category Range association with higher incidence of chronic pain  <18 kg/m2 Underweight   18.5-24.9 kg/m2 Ideal body weight   25-29.9 kg/m2 Overweight Increased incidence by 20%  30-34.9 kg/m2 Obese (Class I) Increased incidence by 68%  35-39.9 kg/m2 Severe obesity (Class II) Increased incidence by 136%  >40 kg/m2 Extreme obesity (Class III) Increased incidence by 254%   BMI Readings from Last 4 Encounters:  03/24/17 45.49 kg/m  03/06/17 43.77 kg/m  03/04/17 43.77 kg/m  03/03/17 43.77 kg/m   Wt Readings from Last 4 Encounters:  03/24/17 265 lb (120.2 kg)  03/06/17 255 lb (115.7 kg)  03/04/17 255 lb (115.7 kg)  03/03/17 255 lb (115.7 kg)  Psych/Mental status: Alert, oriented x 3 (person, place, & time)       Eyes: PERLA Respiratory: No evidence of acute respiratory distress  Cervical Spine Area  Exam  Skin & Axial Inspection: No masses, redness, edema, swelling, or associated skin lesions Alignment: Symmetrical Functional ROM: Decreased ROM      Stability: No instability detected Muscle Tone/Strength: Functionally intact. No obvious neuro-muscular anomalies detected. Sensory (Neurological): Movement-associated discomfort Palpation: No palpable anomalies              Upper Extremity (UE) Exam    Side: Right upper extremity  Side: Left upper extremity  Skin & Extremity Inspection: Skin color, temperature, and hair growth are WNL. No peripheral edema or cyanosis. No masses, redness, swelling, asymmetry, or associated skin lesions. No contractures.  Skin & Extremity Inspection: Skin color, temperature, and hair growth are WNL. No peripheral edema or cyanosis. No masses, redness, swelling, asymmetry, or associated skin lesions. No contractures.  Functional ROM: Unrestricted ROM          Functional ROM: Unrestricted ROM          Muscle Tone/Strength: Functionally intact. No obvious neuro-muscular anomalies detected.  Muscle Tone/Strength: Functionally intact. No obvious neuro-muscular anomalies detected.  Sensory (Neurological): Unimpaired          Sensory (Neurological): Unimpaired          Palpation: No palpable anomalies              Palpation: No palpable anomalies              Specialized Test(s): Deferred         Specialized Test(s): Deferred          Thoracic Spine Area Exam  Skin & Axial Inspection: No masses, redness, or swelling Alignment: Symmetrical Functional ROM: Unrestricted ROM Stability: No instability detected Muscle Tone/Strength: Functionally intact. No obvious neuro-muscular anomalies detected. Sensory (Neurological): Unimpaired Muscle  strength & Tone: No palpable anomalies  Lumbar Spine Area Exam  Skin & Axial Inspection: No masses, redness, or swelling Alignment: Symmetrical Functional ROM: Decreased ROM      Stability: No instability detected Muscle  Tone/Strength: Functionally intact. No obvious neuro-muscular anomalies detected. Sensory (Neurological): Improved Palpation: No palpable anomalies       Provocative Tests: Lumbar Hyperextension and rotation test: Improved after treatment       Lumbar Lateral bending test: evaluation deferred today       Patrick's Maneuver: evaluation deferred today                    Gait & Posture Assessment  Ambulation: Unassisted Gait: Relatively normal for age and body habitus Posture: WNL   Lower Extremity Exam    Side: Right lower extremity  Side: Left lower extremity  Skin & Extremity Inspection: Skin color, temperature, and hair growth are WNL. No peripheral edema or cyanosis. No masses, redness, swelling, asymmetry, or associated skin lesions. No contractures.  Skin & Extremity Inspection: Skin color, temperature, and hair growth are WNL. No peripheral edema or cyanosis. No masses, redness, swelling, asymmetry, or associated skin lesions. No contractures.  Functional ROM: Unrestricted ROM          Functional ROM: Unrestricted ROM          Muscle Tone/Strength: Functionally intact. No obvious neuro-muscular anomalies detected.  Muscle Tone/Strength: Functionally intact. No obvious neuro-muscular anomalies detected.  Sensory (Neurological): Unimpaired  Sensory (Neurological): Unimpaired  Palpation: No palpable anomalies  Palpation: No palpable anomalies   Assessment  Primary Diagnosis & Pertinent Problem List: The primary encounter diagnosis was Chronic low back pain (Secondary Area of Pain) (Bilateral) (R>L). 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  Status Diagnosis  Improved Improved Persistent 1. Chronic low back pain (Secondary Area of  Pain) (Bilateral) (R>L)   2. Lumbar facet syndrome (Bilateral) (R>L)   3. Chronic upper back pain (Primary Area of Pain) (Bilateral) (R>L)   4. 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 Cervical radiculitis (Bilateral)   6. Chronic neck pain (Primary Area of Pain) (Bilateral) (R>L)     Problems updated and reviewed during this visit: No problems updated. Plan of Care  Pharmacotherapy (Medications Ordered): No orders of the defined types were placed in this encounter.  Medications administered today: Alinna L. Men had no medications administered during this visit.  Procedure Orders    No procedure(s) ordered today   Lab Orders  No laboratory test(s) ordered today   Imaging Orders  No imaging studies ordered today   Referral Orders  No referral(s) requested today    Interventional management options: Planned, scheduled, and/or pending:   None at this time.  The patient has to make a decision with regards to the cervical spine surgery.   Considering:   Possiblebilateral thoracic epidural steroid injection Possiblebilateral thoracic facet injections Possiblebilateral thoracic radiofrequency ablation Possible right sided lumbar epidural steroid injection Possibleright-sided lumbar facet injection Possibleright-sided lumbar radiofrequency ablation Possibleleft intra-articular knee injection Possibleleft knee genicular nerve block Possibleleft knee radiofrequency ablation  Possibleright-sided cervical epidural steroid injection possibleright-sided cervical facet block Possibleright-sided cervical radiofrequency ablation Possibleright occipital nerve block   Palliative PRN treatment(s):   None at this time   Provider-requested follow-up: Return  for Med-Mgmt, w/ Dionisio David, NP.  Future Appointments  Date Time Provider [place]  05/12/2017 11:30 AM Cameron Sprang, MD [facility] None  05/15/2017  1:00 PM Charlcie Cradle, MD  [facility] None   Primary Care Physician: Monico Blitz, MD Location: [facility] Note by: Gaspar Cola, MD Date: 03/24/2017; Time: 9:09 AM

## 2017-03-24 ENCOUNTER — Encounter: Payer: Self-pay | Admitting: Pain Medicine

## 2017-03-24 ENCOUNTER — Ambulatory Visit: Payer: Medicare Other | Attending: Pain Medicine | Admitting: Pain Medicine

## 2017-03-24 VITALS — BP 132/81 | HR 104 | Temp 98.6°F | Resp 18 | Ht 64.0 in | Wt 265.0 lb

## 2017-03-24 DIAGNOSIS — M5442 Lumbago with sciatica, left side: Secondary | ICD-10-CM | POA: Diagnosis not present

## 2017-03-24 DIAGNOSIS — M5441 Lumbago with sciatica, right side: Secondary | ICD-10-CM

## 2017-03-24 DIAGNOSIS — M48061 Spinal stenosis, lumbar region without neurogenic claudication: Secondary | ICD-10-CM | POA: Diagnosis not present

## 2017-03-24 DIAGNOSIS — F329 Major depressive disorder, single episode, unspecified: Secondary | ICD-10-CM | POA: Insufficient documentation

## 2017-03-24 DIAGNOSIS — G43109 Migraine with aura, not intractable, without status migrainosus: Secondary | ICD-10-CM | POA: Insufficient documentation

## 2017-03-24 DIAGNOSIS — Z79899 Other long term (current) drug therapy: Secondary | ICD-10-CM | POA: Insufficient documentation

## 2017-03-24 DIAGNOSIS — G8929 Other chronic pain: Secondary | ICD-10-CM

## 2017-03-24 DIAGNOSIS — M5011 Cervical disc disorder with radiculopathy,  high cervical region: Secondary | ICD-10-CM | POA: Diagnosis not present

## 2017-03-24 DIAGNOSIS — F411 Generalized anxiety disorder: Secondary | ICD-10-CM | POA: Insufficient documentation

## 2017-03-24 DIAGNOSIS — M5136 Other intervertebral disc degeneration, lumbar region: Secondary | ICD-10-CM | POA: Diagnosis not present

## 2017-03-24 DIAGNOSIS — I10 Essential (primary) hypertension: Secondary | ICD-10-CM | POA: Diagnosis not present

## 2017-03-24 DIAGNOSIS — M5010 Cervical disc disorder with radiculopathy, unspecified cervical region: Secondary | ICD-10-CM | POA: Diagnosis not present

## 2017-03-24 DIAGNOSIS — Z87891 Personal history of nicotine dependence: Secondary | ICD-10-CM | POA: Diagnosis not present

## 2017-03-24 DIAGNOSIS — G40909 Epilepsy, unspecified, not intractable, without status epilepticus: Secondary | ICD-10-CM | POA: Diagnosis not present

## 2017-03-24 DIAGNOSIS — M542 Cervicalgia: Secondary | ICD-10-CM | POA: Diagnosis not present

## 2017-03-24 DIAGNOSIS — M5412 Radiculopathy, cervical region: Secondary | ICD-10-CM | POA: Diagnosis not present

## 2017-03-24 DIAGNOSIS — G5603 Carpal tunnel syndrome, bilateral upper limbs: Secondary | ICD-10-CM | POA: Diagnosis not present

## 2017-03-24 DIAGNOSIS — F4310 Post-traumatic stress disorder, unspecified: Secondary | ICD-10-CM | POA: Insufficient documentation

## 2017-03-24 DIAGNOSIS — M5134 Other intervertebral disc degeneration, thoracic region: Secondary | ICD-10-CM | POA: Diagnosis not present

## 2017-03-24 DIAGNOSIS — M4802 Spinal stenosis, cervical region: Secondary | ICD-10-CM | POA: Diagnosis not present

## 2017-03-24 DIAGNOSIS — G35 Multiple sclerosis: Secondary | ICD-10-CM | POA: Insufficient documentation

## 2017-03-24 DIAGNOSIS — K219 Gastro-esophageal reflux disease without esophagitis: Secondary | ICD-10-CM | POA: Diagnosis not present

## 2017-03-24 DIAGNOSIS — G894 Chronic pain syndrome: Secondary | ICD-10-CM | POA: Insufficient documentation

## 2017-03-24 DIAGNOSIS — H469 Unspecified optic neuritis: Secondary | ICD-10-CM | POA: Diagnosis not present

## 2017-03-24 DIAGNOSIS — E559 Vitamin D deficiency, unspecified: Secondary | ICD-10-CM | POA: Diagnosis not present

## 2017-03-24 DIAGNOSIS — M47816 Spondylosis without myelopathy or radiculopathy, lumbar region: Secondary | ICD-10-CM | POA: Insufficient documentation

## 2017-03-24 DIAGNOSIS — M545 Low back pain: Secondary | ICD-10-CM | POA: Diagnosis not present

## 2017-03-24 DIAGNOSIS — E669 Obesity, unspecified: Secondary | ICD-10-CM | POA: Diagnosis not present

## 2017-03-24 DIAGNOSIS — M546 Pain in thoracic spine: Secondary | ICD-10-CM | POA: Diagnosis not present

## 2017-03-24 DIAGNOSIS — M549 Dorsalgia, unspecified: Secondary | ICD-10-CM

## 2017-03-24 NOTE — Progress Notes (Signed)
Safety precautions to be maintained throughout the outpatient stay will include: orient to surroundings, keep bed in low position, maintain call bell within reach at all times, provide assistance with transfer out of bed and ambulation.  

## 2017-03-24 NOTE — Patient Instructions (Signed)

## 2017-03-25 DIAGNOSIS — I10 Essential (primary) hypertension: Secondary | ICD-10-CM | POA: Diagnosis not present

## 2017-03-25 DIAGNOSIS — Z6841 Body Mass Index (BMI) 40.0 and over, adult: Secondary | ICD-10-CM | POA: Diagnosis not present

## 2017-03-25 DIAGNOSIS — F419 Anxiety disorder, unspecified: Secondary | ICD-10-CM | POA: Diagnosis not present

## 2017-03-25 DIAGNOSIS — Z299 Encounter for prophylactic measures, unspecified: Secondary | ICD-10-CM | POA: Diagnosis not present

## 2017-04-01 NOTE — Telephone Encounter (Signed)
Will submit PA again

## 2017-04-04 ENCOUNTER — Telehealth: Payer: Self-pay

## 2017-04-04 NOTE — Telephone Encounter (Signed)
Received a call from [facility] at Dr Nira Retort office.  States patient is requesting pain medication for comfort during the time that she has to wait for surgery.  Patient has been informed that she must lose 60+ pounds to be considered for surgery and is taking steps to accomplish this but would like to have something to manage the pain in the meantime.  Will speak to Dr Dossie Arbour when he comes back to office.  Debroah Loop # [phone]  Ext [phone]

## 2017-04-08 ENCOUNTER — Encounter: Payer: Self-pay | Admitting: *Deleted

## 2017-04-14 NOTE — Telephone Encounter (Signed)
Schedule appointment  With Dr Dossie Arbour to discuss pain medication while awaiting surgery.

## 2017-04-16 DIAGNOSIS — Z0279 Encounter for issue of other medical certificate: Secondary | ICD-10-CM

## 2017-04-16 NOTE — Telephone Encounter (Signed)
Called patient and scheduled eval for Monday April 15
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
## 2017-04-21 ENCOUNTER — Encounter: Payer: Self-pay | Admitting: Pain Medicine

## 2017-04-21 ENCOUNTER — Other Ambulatory Visit: Payer: Self-pay

## 2017-04-21 ENCOUNTER — Ambulatory Visit: Payer: Medicare Other | Attending: Pain Medicine | Admitting: Pain Medicine

## 2017-04-21 ENCOUNTER — Telehealth: Payer: Self-pay | Admitting: Neurology

## 2017-04-21 VITALS — BP 143/84 | HR 102 | Temp 98.4°F | Resp 18 | Ht 63.0 in | Wt 265.0 lb

## 2017-04-21 DIAGNOSIS — F329 Major depressive disorder, single episode, unspecified: Secondary | ICD-10-CM | POA: Diagnosis not present

## 2017-04-21 DIAGNOSIS — M5412 Radiculopathy, cervical region: Secondary | ICD-10-CM | POA: Insufficient documentation

## 2017-04-21 DIAGNOSIS — G35 Multiple sclerosis: Secondary | ICD-10-CM | POA: Diagnosis not present

## 2017-04-21 DIAGNOSIS — M549 Dorsalgia, unspecified: Secondary | ICD-10-CM | POA: Diagnosis not present

## 2017-04-21 DIAGNOSIS — G8929 Other chronic pain: Secondary | ICD-10-CM | POA: Diagnosis not present

## 2017-04-21 DIAGNOSIS — I10 Essential (primary) hypertension: Secondary | ICD-10-CM | POA: Insufficient documentation

## 2017-04-21 DIAGNOSIS — R51 Headache: Secondary | ICD-10-CM | POA: Diagnosis not present

## 2017-04-21 DIAGNOSIS — Z5181 Encounter for therapeutic drug level monitoring: Secondary | ICD-10-CM | POA: Insufficient documentation

## 2017-04-21 DIAGNOSIS — M4802 Spinal stenosis, cervical region: Secondary | ICD-10-CM | POA: Diagnosis not present

## 2017-04-21 DIAGNOSIS — Z9889 Other specified postprocedural states: Secondary | ICD-10-CM | POA: Insufficient documentation

## 2017-04-21 DIAGNOSIS — F4310 Post-traumatic stress disorder, unspecified: Secondary | ICD-10-CM | POA: Insufficient documentation

## 2017-04-21 DIAGNOSIS — Z87891 Personal history of nicotine dependence: Secondary | ICD-10-CM | POA: Diagnosis not present

## 2017-04-21 DIAGNOSIS — Z79899 Other long term (current) drug therapy: Secondary | ICD-10-CM | POA: Diagnosis not present

## 2017-04-21 DIAGNOSIS — M542 Cervicalgia: Secondary | ICD-10-CM

## 2017-04-21 MED ORDER — ORPHENADRINE CITRATE 30 MG/ML IJ SOLN
INTRAMUSCULAR | Status: AC
  Filled 2017-04-21: qty 2

## 2017-04-21 MED ORDER — ORPHENADRINE CITRATE 30 MG/ML IJ SOLN
60.0000 mg | Freq: Once | INTRAMUSCULAR | Status: AC
  Administered 2017-04-21: 60 mg via INTRAMUSCULAR

## 2017-04-21 MED ORDER — KETOROLAC TROMETHAMINE 60 MG/2ML IM SOLN
60.0000 mg | Freq: Once | INTRAMUSCULAR | Status: AC
  Administered 2017-04-21: 60 mg via INTRAMUSCULAR

## 2017-04-21 MED ORDER — KETOROLAC TROMETHAMINE 60 MG/2ML IM SOLN
INTRAMUSCULAR | Status: AC
  Filled 2017-04-21: qty 2

## 2017-04-21 NOTE — Telephone Encounter (Signed)
I will call pt about her migraine.  Sandi - do you know about Botox approval?

## 2017-04-21 NOTE — Telephone Encounter (Signed)
Faxed form with clinic notes from 11/27/16, again. Will call later today to ensure rcvd and processing.

## 2017-04-21 NOTE — Telephone Encounter (Signed)
Needs to know about the Botox approval and she is still having really bad migraine and needs to talk to someone. She state that the medication is not working please call her

## 2017-04-21 NOTE — Patient Instructions (Addendum)
____________________________________________________________________________________________  Preparing for Procedure with Sedation  Instructions: . Oral Intake: Do not eat or drink anything for at least 8 hours prior to your procedure. . Transportation: Public transportation is not allowed. Bring an adult driver. The driver must be physically present in our waiting room before any procedure can be started. . Physical Assistance: Bring an adult physically capable of assisting you, in the event you need help. This adult should keep you company at home for at least 6 hours after the procedure. . Blood Pressure Medicine: Take your blood pressure medicine with a sip of water the morning of the procedure. . Blood thinners:  . Diabetics on insulin: Notify the staff so that you can be scheduled 1st case in the morning. If your diabetes requires high dose insulin, take only  of your normal insulin dose the morning of the procedure and notify the staff that you have done so. . Preventing infections: Shower with an antibacterial soap the morning of your procedure. . Build-up your immune system: Take 1000 mg of Vitamin C with every meal (3 times a day) the day prior to your procedure. . Antibiotics: Inform the staff if you have a condition or reason that requires you to take antibiotics before dental procedures. . Pregnancy: If you are pregnant, call and cancel the procedure. . Sickness: If you have a cold, fever, or any active infections, call and cancel the procedure. . Arrival: You must be in the facility at least 30 minutes prior to your scheduled procedure. . Children: Do not bring children with you. . Dress appropriately: Bring dark clothing that you would not mind if they get stained. . Valuables: Do not bring any jewelry or valuables.  Procedure appointments are reserved for interventional treatments only. . No Prescription Refills. . No medication changes will be discussed during procedure  appointments. . No disability issues will be discussed.  Remember:  Regular Business hours are:  Monday to Thursday 8:00 AM to 4:00 PM  Provider's Schedule: Riely Oetken, MD:  Procedure days: Tuesday and Thursday 7:30 AM to 4:00 PM  Bilal Lateef, MD:  Procedure days: Monday and Wednesday 7:30 AM to 4:00 PM ____________________________________________________________________________________________  ____________________________________________________________________________________________  General Risks and Possible Complications  Patient Responsibilities: It is important that you read this as it is part of your informed consent. It is our duty to inform you of the risks and possible complications associated with treatments offered to you. It is your responsibility as a patient to read this and to ask questions about anything that is not clear or that you believe was not covered in this document.  Patient's Rights: You have the right to refuse treatment. You also have the right to change your mind, even after initially having agreed to have the treatment done. However, under this last option, if you wait until the last second to change your mind, you may be charged for the materials used up to that point.  Introduction: Medicine is not an exact science. Everything in Medicine, including the lack of treatment(s), carries the potential for danger, harm, or loss (which is by definition: Risk). In Medicine, a complication is a secondary problem, condition, or disease that can aggravate an already existing one. All treatments carry the risk of possible complications. The fact that a side effects or complications occurs, does not imply that the treatment was conducted incorrectly. It must be clearly understood that these can happen even when everything is done following the highest safety standards.  No treatment: You   can choose not to proceed with the proposed treatment alternative. The  "PRO(s)" would include: avoiding the risk of complications associated with the therapy. The "CON(s)" would include: not getting any of the treatment benefits. These benefits fall under one of three categories: diagnostic; therapeutic; and/or palliative. Diagnostic benefits include: getting information which can ultimately lead to improvement of the disease or symptom(s). Therapeutic benefits are those associated with the successful treatment of the disease. Finally, palliative benefits are those related to the decrease of the primary symptoms, without necessarily curing the condition (example: decreasing the pain from a flare-up of a chronic condition, such as incurable terminal cancer).  General Risks and Complications: These are associated to most interventional treatments. They can occur alone, or in combination. They fall under one of the following six (6) categories: no benefit or worsening of symptoms; bleeding; infection; nerve damage; allergic reactions; and/or death. 1. No benefits or worsening of symptoms: In Medicine there are no guarantees, only probabilities. No healthcare provider can ever guarantee that a medical treatment will work, they can only state the probability that it may. Furthermore, there is always the possibility that the condition may worsen, either directly, or indirectly, as a consequence of the treatment. 2. Bleeding: This is more common if the patient is taking a blood thinner, either prescription or over the counter (example: Goody Powders, Fish oil, Aspirin, Garlic, etc.), or if suffering a condition associated with impaired coagulation (example: Hemophilia, cirrhosis of the liver, low platelet counts, etc.). However, even if you do not have one on these, it can still happen. If you have any of these conditions, or take one of these drugs, make sure to notify your treating physician. 3. Infection: This is more common in patients with a compromised immune system, either due to  disease (example: diabetes, cancer, human immunodeficiency virus [HIV], etc.), or due to medications or treatments (example: therapies used to treat cancer and rheumatological diseases). However, even if you do not have one on these, it can still happen. If you have any of these conditions, or take one of these drugs, make sure to notify your treating physician. 4. Nerve Damage: This is more common when the treatment is an invasive one, but it can also happen with the use of medications, such as those used in the treatment of cancer. The damage can occur to small secondary nerves, or to large primary ones, such as those in the spinal cord and brain. This damage may be temporary or permanent and it may lead to impairments that can range from temporary numbness to permanent paralysis and/or brain death. 5. Allergic Reactions: Any time a substance or material comes in contact with our body, there is the possibility of an allergic reaction. These can range from a mild skin rash (contact dermatitis) to a severe systemic reaction (anaphylactic reaction), which can result in death. 6. Death: In general, any medical intervention can result in death, most of the time due to an unforeseen complication. ____________________________________________________________________________________________  Epidural Steroid Injection Patient Information  Description: The epidural space surrounds the nerves as they exit the spinal cord.  In some patients, the nerves can be compressed and inflamed by a bulging disc or a tight spinal canal (spinal stenosis).  By injecting steroids into the epidural space, we can bring irritated nerves into direct contact with a potentially helpful medication.  These steroids act directly on the irritated nerves and can reduce swelling and inflammation which often leads to decreased pain.  Epidural steroids may be  injected anywhere along the spine and from the neck to the low back depending upon the  location of your pain.   After numbing the skin with local anesthetic (like Novocaine), a small needle is passed into the epidural space slowly.  You may experience a sensation of pressure while this is being done.  The entire block usually last less than 10 minutes.  Conditions which may be treated by epidural steroids:   Low back and leg pain  Neck and arm pain  Spinal stenosis  Post-laminectomy syndrome  Herpes zoster (shingles) pain  Pain from compression fractures  Preparation for the injection:  1. Do not eat any solid food or dairy products within 8 hours of your appointment.  2. You may drink clear liquids up to 3 hours before appointment.  Clear liquids include water, black coffee, juice or soda.  No milk or cream please. 3. You may take your regular medication, including pain medications, with a sip of water before your appointment  Diabetics should hold regular insulin (if taken separately) and take 1/2 normal NPH dos the morning of the procedure.  Carry some sugar containing items with you to your appointment. 4. A driver must accompany you and be prepared to drive you home after your procedure.  5. Bring all your current medications with your. 6. An IV may be inserted and sedation may be given at the discretion of the physician.   7. A blood pressure cuff, EKG and other monitors will often be applied during the procedure.  Some patients may need to have extra oxygen administered for a short period. 8. You will be asked to provide medical information, including your allergies, prior to the procedure.  We must know immediately if you are taking blood thinners (like Coumadin/Warfarin)  Or if you are allergic to IV iodine contrast (dye). We must know if you could possible be pregnant.  Possible side-effects:  Bleeding from needle site  Infection (rare, may require surgery)  Nerve injury (rare)  Numbness & tingling (temporary)  Difficulty urinating (rare,  temporary)  Spinal headache ( a headache worse with upright posture)  Light -headedness (temporary)  Pain at injection site (several days)  Decreased blood pressure (temporary)  Weakness in arm/leg (temporary)  Pressure sensation in back/neck (temporary)  Call if you experience:  Fever/chills associated with headache or increased back/neck pain.  Headache worsened by an upright position.  New onset weakness or numbness of an extremity below the injection site  Hives or difficulty breathing (go to the emergency room)  Inflammation or drainage at the infection site  Severe back/neck pain  Any new symptoms which are concerning to you  Please note:  Although the local anesthetic injected can often make your back or neck feel good for several hours after the injection, the pain will likely return.  It takes 3-7 days for steroids to work in the epidural space.  You may not notice any pain relief for at least that one week.  If effective, we will often do a series of three injections spaced 3-6 weeks apart to maximally decrease your pain.  After the initial series, we generally will wait several months before considering a repeat injection of the same type.  If you have any questions, please call [phone] [facility]

## 2017-04-22 DIAGNOSIS — Z6841 Body Mass Index (BMI) 40.0 and over, adult: Secondary | ICD-10-CM | POA: Diagnosis not present

## 2017-04-22 DIAGNOSIS — I10 Essential (primary) hypertension: Secondary | ICD-10-CM | POA: Diagnosis not present

## 2017-04-22 DIAGNOSIS — Z299 Encounter for prophylactic measures, unspecified: Secondary | ICD-10-CM | POA: Diagnosis not present

## 2017-04-22 DIAGNOSIS — Z713 Dietary counseling and surveillance: Secondary | ICD-10-CM | POA: Diagnosis not present

## 2017-04-23 NOTE — Progress Notes (Signed)
Faxed PA for Botox to [facility] on 04/21/17. (see telephone call 04/21/17) Called [facility], spoke with Tomeka I. Pt is no longer covered under Medicaid. She only has Medicare. Tomeka was unable to release the date the Pt's MCD ended. Confirmation number for call # [id number]  Will confirm with Pt that she is MCR only. If so, MCR does not require PA for Botox. LMOVM for Pt to rtrn my call to confirm her insurance.

## 2017-04-23 NOTE — Progress Notes (Signed)
Entered in error

## 2017-04-29 DIAGNOSIS — M47812 Spondylosis without myelopathy or radiculopathy, cervical region: Secondary | ICD-10-CM | POA: Insufficient documentation

## 2017-04-29 NOTE — Progress Notes (Signed)
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 terms.                         Meds   Current Outpatient Medications:  .  albuterol (PROVENTIL HFA;VENTOLIN HFA) 108 (90 Base) MCG/ACT inhaler, Inhale 2 puffs into the lungs every 6 (six) hours as needed for wheezing or shortness of breath. (Patient taking differently: Inhale 2 puffs into the lungs every 4 (four) hours as needed for wheezing or shortness of breath. ), Disp: 1 Inhaler, Rfl: 0 .  amLODipine (NORVASC) 5 MG tablet, Take 5 mg by mouth every evening.,  Disp: , Rfl: 2 .  diazepam (VALIUM) 5 MG tablet, Take 1 tablet (5 mg total) by mouth daily. Take 1 tablet  as needed for panic attacks leading to seizures., Disp: 30 tablet, Rfl: 3 .  diphenhydrAMINE (SOMINEX) 25 MG tablet, Take 2 tablets (50 mg total) by mouth at bedtime as needed for sleep. (Patient taking differently: Take 50 mg by mouth at bedtime. ), Disp: 60 tablet, Rfl: 3 .  divalproex (DEPAKOTE) 500 MG DR tablet, Take 1 tablet (500 mg total) by mouth at bedtime., Disp: 90 tablet, Rfl: 3 .  EPINEPHrine (EPIPEN) 0.3 mg/0.3 mL SOAJ injection, Inject 0.3 mg into the muscle as needed (allergic reaction). Reported on 03/28/2015, Disp: , Rfl:  .  etonogestrel (NEXPLANON) 68 MG IMPL implant, 1 each by Subdermal route once., Disp: , Rfl:  .  FLUoxetine (PROZAC) 40 MG capsule, Take 2 capsules (80 mg total) by mouth daily., Disp: 60 capsule, Rfl: 3 .  GILENYA 0.5 MG CAPS, TAKE 1 CAPSULE BY MOUTH EVERY DAY, Disp: 30 capsule, Rfl: 10 .  hydrOXYzine (VISTARIL) 25 MG capsule, Take 1 capsule (25 mg total) by mouth 2 (two) times daily., Disp: 60 capsule, Rfl: 2 .  ibuprofen (ADVIL,MOTRIN) 600 MG tablet, Take 600 mg by mouth daily as needed for moderate pain., Disp: , Rfl:  .  loratadine (CLARITIN) 10 MG tablet, Take 10 mg by mouth daily as needed for allergies., Disp: , Rfl:  .  methocarbamol (ROBAXIN) 750 MG tablet, Take 1 tablet (750 mg total) by mouth 2 (two) times daily., Disp: 180 tablet, Rfl: 0 .  metoCLOPramide (REGLAN) 10 MG tablet, Take as needed, Disp: 10 tablet, Rfl: 0 .  Multiple Vitamins-Minerals (MULTIVITAMIN ADULT PO), Take 1 tablet by mouth daily. , Disp: , Rfl:  .  OnabotulinumtoxinA (BOTOX IJ), Inject as directed every 3 (three) months., Disp: , Rfl:  .  phentermine 37.5 MG capsule, Take 37.5 mg by mouth every morning., Disp: , Rfl:  .  ranitidine (ZANTAC) 300 MG tablet, Take 300 mg by mouth at bedtime. , Disp: , Rfl:  .  SUMAtriptan (IMITREX) 100 MG tablet, Take 1 tablet (100 mg total) by  mouth every 2 (two) hours as needed for migraine. May repeat in 2 hours if headache persists or recurs., Disp: 9 tablet, Rfl: 6 .  topiramate (TOPAMAX) 100 MG tablet, Take 1 tablet twice daily, Disp: 180 tablet, Rfl: 3 .  Calcium Carb-Cholecalciferol (CALCIUM PLUS D3 ABSORBABLE) [phone] MG-UNIT CAPS, Take 1 capsule by mouth daily with breakfast., Disp: 90 capsule, Rfl: 0 .  pregabalin (LYRICA) 100 MG capsule, Take 1 capsule (100 mg total) by mouth 3 (three) times daily., Disp: 90 capsule, Rfl: 0  Current Facility-Administered Medications:  .  metoCLOPramide (REGLAN) 10 mg in dextrose 5 % 50 mL IVPB, 10 mg, Intravenous, Once, Delice Lesch, Lezlie Octave, MD  Facility-Administered Medications Ordered in Other Visits:  .  metoCLOPramide (REGLAN) injection 10 mg, 10 mg, Intravenous, Once, Milton Ferguson, MD  ROS  Constitutional: Denies any fever or chills Gastrointestinal: No reported hemesis, hematochezia, vomiting, or acute GI distress Musculoskeletal: Denies any acute onset joint swelling, redness, loss of ROM, or weakness Neurological: No reported episodes of acute onset apraxia, aphasia, dysarthria, agnosia, amnesia, paralysis, loss of coordination, or loss of consciousness  Allergies  Emily [name] is allergic to amitriptyline; baclofen; duloxetine hcl; gabapentin; hydrocodone-acetaminophen; magnesium salicylate; monosodium glutamate; other; tizanidine; alprazolam; rizatriptan; vicodin [hydrocodone-acetaminophen]; adhesive [tape]; ketorolac tromethamine; lamotrigine; and tramadol.  PFSH  Drug: Emily [name]  reports that she does not use drugs. Alcohol:  reports that she does not drink alcohol. Tobacco:  reports that she quit smoking about 22 months  ago. Her smoking use included cigarettes. She has a 2.50 pack-year smoking history. She has never used smokeless tobacco. Medical:  has a past medical history of Anxiety, Asthma (as a child), Chronic back pain, Chronic pain, Conversion disorder, Depression, GERD  (gastroesophageal reflux disease), Headache(784.0), HTN (hypertension) (02/27/2015), JC virus antibody positive, Migraines, MS (multiple sclerosis) ([place]), Multiple sclerosis ([place]), Ovarian cyst, Panic attack, Perforated bowel ([place]) (2009), Pseudoseizures, PTSD (post-traumatic stress disorder), S/P emergency C-section, and Urinary urgency. 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 Family: family history includes Arthritis in her father; Cancer in her maternal grandfather and maternal grandmother; Diabetes in her father and mother; Hypertension in her father and mother.  Constitutional Exam  General appearance: Well nourished, well developed, and well hydrated. In no apparent acute distress Vitals:   04/30/17 1134  BP: 118/87  Pulse: 97  Temp: 98.1 F (36.7 C)  SpO2: 100%  Weight: 260 lb (117.9 kg)  Height: _0  (1.626 m)   BMI Assessment: Estimated body mass index is 44.63 kg/m as calculated from the following:   Height as of this encounter: _1  (1.626 m).   Weight as of this encounter: 260 lb (117.9 kg).  BMI interpretation table: BMI level Category Range association with higher incidence of chronic pain  <18 kg/m2 Underweight   18.5-24.9 kg/m2 Ideal body weight   25-29.9 kg/m2 Overweight Increased incidence by 20%  30-34.9 kg/m2 Obese (Class I) Increased incidence by 68%  35-39.9 kg/m2 Severe obesity (Class II) Increased incidence by 136%  >40 kg/m2 Extreme obesity (Class III) Increased incidence by 254%   BMI Readings from Last 4 Encounters:  04/30/17 44.63 kg/m  04/21/17  46.94 kg/m  03/24/17 45.49 kg/m  03/06/17 43.77 kg/m   Wt Readings from Last 4 Encounters:  04/30/17 260 lb (117.9 kg)  04/21/17 265 lb (120.2 kg)  03/24/17 265 lb (120.2 kg)  03/06/17 255 lb (115.7 kg)  Psych/Mental status: Alert, oriented x 3 (person, place, & time)       Eyes: PERLA Respiratory: No evidence of acute respiratory distress  Cervical Spine Area Exam  Skin & Axial Inspection: No masses, redness, edema, swelling, or associated skin lesions Alignment: Symmetrical Functional ROM: Decreased ROM      Stability: No instability detected Muscle Tone/Strength: Guarding observed Sensory (Neurological): Movement-associated pain Palpation: Complains of area being tender to palpation              Upper Extremity (UE) Exam    Side: Right upper extremity  Side: Left upper extremity  Skin & Extremity Inspection: Skin color, temperature, and hair growth are WNL. No peripheral edema or cyanosis. No masses, redness, swelling, asymmetry, or associated skin lesions. No contractures.  Skin & Extremity Inspection: Skin color, temperature, and hair growth are WNL. No peripheral edema or cyanosis. No masses, redness, swelling, asymmetry, or associated skin lesions. No contractures.  Functional ROM: Unrestricted ROM          Functional ROM: Unrestricted ROM          Muscle Tone/Strength: Functionally intact. No obvious neuro-muscular anomalies detected.  Muscle Tone/Strength: Functionally intact. No obvious neuro-muscular anomalies detected.  Sensory (Neurological): Unimpaired          Sensory (  Neurological): Unimpaired          Palpation: No palpable anomalies              Palpation: No palpable anomalies              Specialized Test(s): Deferred         Specialized Test(s): Deferred          Thoracic Spine Area Exam  Skin & Axial Inspection: No masses, redness, or swelling Alignment: Symmetrical Functional ROM: Unrestricted ROM Stability: No instability detected Muscle Tone/Strength:  Functionally intact. No obvious neuro-muscular anomalies detected. Sensory (Neurological): Unimpaired Muscle strength & Tone: No palpable anomalies  Lumbar Spine Area Exam  Skin & Axial Inspection: No masses, redness, or swelling Alignment: Symmetrical Functional ROM: Unrestricted ROM       Stability: No instability detected Muscle Tone/Strength: Functionally intact. No obvious neuro-muscular anomalies detected. Sensory (Neurological): Unimpaired Palpation: No palpable anomalies       Provocative Tests: Lumbar Hyperextension and rotation test: evaluation deferred today       Lumbar Lateral bending test: evaluation deferred today       Patrick's Maneuver: evaluation deferred today                    Gait & Posture Assessment  Ambulation: Unassisted Gait: Relatively normal for age and body habitus Posture: WNL   Lower Extremity Exam    Side: Right lower extremity  Side: Left lower extremity  Skin & Extremity Inspection: Skin color, temperature, and hair growth are WNL. No peripheral edema or cyanosis. No masses, redness, swelling, asymmetry, or associated skin lesions. No contractures.  Skin & Extremity Inspection: Skin color, temperature, and hair growth are WNL. No peripheral edema or cyanosis. No masses, redness, swelling, asymmetry, or associated skin lesions. No contractures.  Functional ROM: Unrestricted ROM          Functional ROM: Unrestricted ROM          Muscle Tone/Strength: Functionally intact. No obvious neuro-muscular anomalies detected.  Muscle Tone/Strength: Functionally intact. No obvious neuro-muscular anomalies detected.  Sensory (Neurological): Unimpaired  Sensory (Neurological): Unimpaired  Palpation: No palpable anomalies  Palpation: No palpable anomalies   Assessment  Primary Diagnosis & Pertinent Problem List: The primary encounter diagnosis was Chronic neck pain (Primary Area of Pain) (Bilateral) (R>L). Diagnoses of Cervicogenic headache, Cervical facet syndrome  (Bilateral) (R>L), Spondylosis without myelopathy or radiculopathy, cervical region, Occipital headache, Cervical facet hypertrophy, DDD (degenerative disc disease), cervical, Chronic upper back pain (Primary Area of Pain) (Bilateral) (R>L), and Neurogenic pain were also pertinent to this visit.  Status Diagnosis  Worsening Worsening Worsening 1. Chronic neck pain (Primary Area of Pain) (Bilateral) (R>L)   2. Cervicogenic headache   3. Cervical facet syndrome (Bilateral) (R>L)   4. Spondylosis without myelopathy or radiculopathy, cervical region   5. Occipital headache   6. Cervical facet hypertrophy   7. DDD (degenerative disc disease), cervical   8. Chronic upper back pain (Primary Area of Pain) (Bilateral) (R>L)   9. Neurogenic pain     Problems updated and reviewed during this visit: No problems updated. Plan of Care  Pharmacotherapy (Medications Ordered): Meds ordered this encounter  Medications  . pregabalin (LYRICA) 100 MG capsule    Sig: Take 1 capsule (100 mg total) by mouth 3 (three) times daily.    Dispense:  90 capsule    Refill:  0    Do not place this medication, or any other prescription from our practice, on "  Automatic Refill". Patient may have prescription filled one day early if pharmacy is closed on scheduled refill date.   Medications administered today: Sheina L. Dupre had no medications administered during this visit.   Procedure Orders     CERVICAL FACET (MEDIAL BRANCH NERVE BLOCK)  Lab Orders  No laboratory test(s) ordered today   Imaging Orders  No imaging studies ordered today   Referral Orders  No referral(s) requested today    Interventional management options: Planned, scheduled, and/or pending:   Diagnostic bilateral cervical facet block #1 (NO STEROIDS) under fluoroscopic guidance and IV sedation   Considering:   Possiblebilateral thoracic epidural steroid injection Possiblebilateral thoracic facet injections Possiblebilateral  thoracic radiofrequency ablation Possible right sided lumbar epidural steroid injection Possibleright-sided lumbar facet injection Possibleright-sided lumbar radiofrequency ablation Possibleleft intra-articular knee injection Possibleleft knee genicular nerve block Possibleleft knee radiofrequency ablation  Possibleright-sided cervical epidural steroid injection possibleright-sided cervical facet block Possibleright-sided cervical radiofrequency ablation Possibleright occipital nerve block   Palliative PRN treatment(s):   None at this time   Provider-requested follow-up: Return for Procedure (w/ sedation): (B) C-FCT BLK #1 (NO STEROIDS).  Future Appointments  Date Time Provider [place]  05/12/2017 11:30 AM Cameron Sprang, MD [facility] None  05/13/2017 12:45 PM Milinda Pointer, MD [facility] None  05/15/2017  1:00 PM Charlcie Cradle, MD [facility] None  06/06/2017 10:00 AM Pieter Partridge, DO [facility] None   Primary Care Physician: Monico Blitz, MD Location: [facility] Note by: Gaspar Cola, MD Date: 04/30/2017; Time: 1:58 PM

## 2017-04-30 ENCOUNTER — Other Ambulatory Visit: Payer: Self-pay

## 2017-04-30 ENCOUNTER — Ambulatory Visit: Payer: Medicare Other | Attending: Pain Medicine | Admitting: Pain Medicine

## 2017-04-30 ENCOUNTER — Encounter: Payer: Self-pay | Admitting: Pain Medicine

## 2017-04-30 VITALS — BP 118/87 | HR 97 | Temp 98.1°F | Ht 64.0 in | Wt 260.0 lb

## 2017-04-30 DIAGNOSIS — M4802 Spinal stenosis, cervical region: Secondary | ICD-10-CM | POA: Insufficient documentation

## 2017-04-30 DIAGNOSIS — K430 Incisional hernia with obstruction, without gangrene: Secondary | ICD-10-CM | POA: Diagnosis not present

## 2017-04-30 DIAGNOSIS — M5116 Intervertebral disc disorders with radiculopathy, lumbar region: Secondary | ICD-10-CM | POA: Insufficient documentation

## 2017-04-30 DIAGNOSIS — M549 Dorsalgia, unspecified: Secondary | ICD-10-CM | POA: Diagnosis not present

## 2017-04-30 DIAGNOSIS — G40909 Epilepsy, unspecified, not intractable, without status epilepticus: Secondary | ICD-10-CM | POA: Insufficient documentation

## 2017-04-30 DIAGNOSIS — K219 Gastro-esophageal reflux disease without esophagitis: Secondary | ICD-10-CM | POA: Insufficient documentation

## 2017-04-30 DIAGNOSIS — M79641 Pain in right hand: Secondary | ICD-10-CM | POA: Diagnosis not present

## 2017-04-30 DIAGNOSIS — F411 Generalized anxiety disorder: Secondary | ICD-10-CM | POA: Insufficient documentation

## 2017-04-30 DIAGNOSIS — E669 Obesity, unspecified: Secondary | ICD-10-CM | POA: Diagnosis not present

## 2017-04-30 DIAGNOSIS — J039 Acute tonsillitis, unspecified: Secondary | ICD-10-CM | POA: Diagnosis not present

## 2017-04-30 DIAGNOSIS — Z8249 Family history of ischemic heart disease and other diseases of the circulatory system: Secondary | ICD-10-CM | POA: Insufficient documentation

## 2017-04-30 DIAGNOSIS — Z885 Allergy status to narcotic agent status: Secondary | ICD-10-CM | POA: Insufficient documentation

## 2017-04-30 DIAGNOSIS — M792 Neuralgia and neuritis, unspecified: Secondary | ICD-10-CM | POA: Diagnosis not present

## 2017-04-30 DIAGNOSIS — F329 Major depressive disorder, single episode, unspecified: Secondary | ICD-10-CM | POA: Diagnosis not present

## 2017-04-30 DIAGNOSIS — E559 Vitamin D deficiency, unspecified: Secondary | ICD-10-CM | POA: Diagnosis not present

## 2017-04-30 DIAGNOSIS — I10 Essential (primary) hypertension: Secondary | ICD-10-CM | POA: Diagnosis not present

## 2017-04-30 DIAGNOSIS — Z791 Long term (current) use of non-steroidal anti-inflammatories (NSAID): Secondary | ICD-10-CM | POA: Insufficient documentation

## 2017-04-30 DIAGNOSIS — R519 Headache, unspecified: Secondary | ICD-10-CM

## 2017-04-30 DIAGNOSIS — Z9889 Other specified postprocedural states: Secondary | ICD-10-CM | POA: Insufficient documentation

## 2017-04-30 DIAGNOSIS — G8929 Other chronic pain: Secondary | ICD-10-CM

## 2017-04-30 DIAGNOSIS — Z8261 Family history of arthritis: Secondary | ICD-10-CM | POA: Insufficient documentation

## 2017-04-30 DIAGNOSIS — G35 Multiple sclerosis: Secondary | ICD-10-CM | POA: Insufficient documentation

## 2017-04-30 DIAGNOSIS — Z833 Family history of diabetes mellitus: Secondary | ICD-10-CM | POA: Insufficient documentation

## 2017-04-30 DIAGNOSIS — G5603 Carpal tunnel syndrome, bilateral upper limbs: Secondary | ICD-10-CM | POA: Diagnosis not present

## 2017-04-30 DIAGNOSIS — M542 Cervicalgia: Secondary | ICD-10-CM

## 2017-04-30 DIAGNOSIS — Z87891 Personal history of nicotine dependence: Secondary | ICD-10-CM | POA: Insufficient documentation

## 2017-04-30 DIAGNOSIS — Z809 Family history of malignant neoplasm, unspecified: Secondary | ICD-10-CM | POA: Insufficient documentation

## 2017-04-30 DIAGNOSIS — G894 Chronic pain syndrome: Secondary | ICD-10-CM | POA: Diagnosis not present

## 2017-04-30 DIAGNOSIS — M5030 Other cervical disc degeneration, unspecified cervical region: Secondary | ICD-10-CM

## 2017-04-30 DIAGNOSIS — M7918 Myalgia, other site: Secondary | ICD-10-CM | POA: Diagnosis not present

## 2017-04-30 DIAGNOSIS — R51 Headache: Secondary | ICD-10-CM

## 2017-04-30 DIAGNOSIS — Z79899 Other long term (current) drug therapy: Secondary | ICD-10-CM | POA: Diagnosis not present

## 2017-04-30 DIAGNOSIS — H469 Unspecified optic neuritis: Secondary | ICD-10-CM | POA: Diagnosis not present

## 2017-04-30 DIAGNOSIS — M47819 Spondylosis without myelopathy or radiculopathy, site unspecified: Secondary | ICD-10-CM | POA: Diagnosis not present

## 2017-04-30 DIAGNOSIS — G4486 Cervicogenic headache: Secondary | ICD-10-CM

## 2017-04-30 DIAGNOSIS — Z888 Allergy status to other drugs, medicaments and biological substances status: Secondary | ICD-10-CM | POA: Diagnosis not present

## 2017-04-30 DIAGNOSIS — M47812 Spondylosis without myelopathy or radiculopathy, cervical region: Secondary | ICD-10-CM | POA: Diagnosis not present

## 2017-04-30 MED ORDER — PREGABALIN 100 MG PO CAPS: 100.0000 mg | ORAL_CAPSULE | Freq: Three times a day (TID) | ORAL | 0 refills | Status: DC

## 2017-04-30 NOTE — Progress Notes (Signed)
Dr Zenaida Deed in room with pt. With door open. Heard patient crying from down the hall went to room. Stayed  for approx 30 minutes while MD talked with pt.. During that time pt continued to cry. Dr Zenaida Deed would explained to the patient  pros and cons of narcotic medications. He told Emily [name] that he cant prescribed a Narcotic while taking Diazapam, related to resp depression. Cervical  MRI was reviewed with the pt and was informed that she needed cervicall facets to help with her current problem which could lead to radiofrequency that would give her longer relief. Patient  States that she made the appt so she could discuss things with the MD. Much encouragement was given to the pt and explained procedure with her. Pt with understanding and felt better about things upon discharge.

## 2017-04-30 NOTE — Patient Instructions (Addendum)
Pre-Procedure Instructions   Nothing to eat or drink 8 hours prior to your procedure.   -   Take all your morning medications with Sips of water   -    If you are insulin dependent, take half of your normal dose   -         Please bring all of your current home medications (or a list ) in their                       original bottles with you    - Do not take Metformin 24 hours before procedure or 48 hours after   - Stop Pradaxa, Xarelto, Coumadin, or Eliquis for 3 days prior to your   Procedure    Facet Joint Block The facet joints connect the bones of the spine (vertebrae). They make it possible for you to bend, twist, and make other movements with your spine. They also keep you from bending too far, twisting too far, and making other excessive movements. A facet joint block is a procedure where a numbing medicine (anesthetic) is injected into a facet joint. Often, a type of anti-inflammatory medicine called a steroid is also injected. A facet joint block may be done to diagnose neck or back pain. If the pain gets better after a facet joint block, it means the pain is probably coming from the facet joint. If the pain does not get better, it means the pain is probably not coming from the facet joint. A facet joint block may also be done to relieve neck or back pain caused by an inflamed facet joint. A facet joint block is only done to relieve pain if the pain does not improve with other methods, such as medicine, exercise programs, and physical therapy. Tell a health care provider about:  Any allergies you have.  All medicines you are taking, including vitamins, herbs, eye drops, creams, and over-the-counter medicines.  Any problems you or family members have had with anesthetic medicines.  Any blood disorders you have.  Any surgeries you have had.  Any medical conditions you have.  Whether you are pregnant or may be pregnant. What are the risks? Generally, this is a  safe procedure. However, problems may occur, including:  Bleeding.  Injury to a nerve near the injection site.  Pain at the injection site.  Weakness or numbness in areas controlled by nerves near the injection site.  Infection.  Temporary fluid retention.  Allergic reactions to medicines or dyes.  Injury to other structures or organs near the injection site.  What happens before the procedure?  Follow instructions from your health care provider about eating or drinking restrictions.  Ask your health care provider about: ? Changing or stopping your regular medicines. This is especially important if you are taking diabetes medicines or blood thinners. ? Taking medicines such as aspirin and ibuprofen. These medicines can thin your blood. Do not take these medicines before your procedure if your health care provider instructs you not to.  Do not take any new dietary supplements or medicines without asking your health care provider first.  Plan to have someone take you home after the procedure. What happens during the procedure?  You may need to remove your clothing and dress in an open-back gown.  The procedure will be done while you are lying on an X-ray table. You will most likely be asked to lie on your stomach, but  you may be asked to lie in a different position if an injection will be made in your neck.  Machines will be used to monitor your oxygen levels, heart rate, and blood pressure.  If an injection will be made in your neck, an IV tube will be inserted into one of your veins. Fluids and medicine will flow directly into your body through the IV tube.  The area over the facet joint where the injection will be made will be cleaned with soap. The surrounding skin will be covered with clean drapes.  A numbing medicine (local anesthetic) will be applied to your skin. Your skin may sting or burn for a moment.  A video X-ray machine (fluoroscopy) will be used to locate the  joint. In some cases, a CT scan may be used.  A contrast dye may be injected into the facet joint area to help locate the joint.  When the joint is located, an anesthetic will be injected into the joint through the needle.  Your health care provider will ask you whether you feel pain relief. If you do feel relief, a steroid may be injected to provide pain relief for a longer period of time. If you do not feel relief or feel only partial relief, additional injections of an anesthetic may be made in other facet joints.  The needle will be removed.  Your skin will be cleaned.  A bandage (dressing) will be applied over each injection site. The procedure may vary among health care providers and hospitals. What happens after the procedure?  You will be observed for 15-30 minutes before being allowed to go home. This information is not intended to replace advice given to you by your health care provider. Make sure you discuss any questions you have with your health care provider. Document Released: 05/15/2006 Document Revised: 01/25/2015 Document Reviewed: 09/19/2014 Elsevier Interactive Patient Education  2018 [employer].  ____________________________________________________________________________________________  Preparing for Procedure with Sedation  Instructions: . Oral Intake: Do not eat or drink anything for at least 8 hours prior to your procedure. . Transportation: Public transportation is not allowed. Bring an adult driver. The driver must be physically present in our waiting room before any procedure can be started. Marland Kitchen Physical Assistance: Bring an adult physically capable of assisting you, in the event you need help. This adult should keep you company at home for at least 6 hours after the procedure. . Blood Pressure Medicine: Take your blood pressure medicine with a sip of water the morning of the procedure. . Blood thinners:  . Diabetics on insulin: Notify the staff so that  you can be scheduled 1st case in the morning. If your diabetes requires high dose insulin, take only  of your normal insulin dose the morning of the procedure and notify the staff that you have done so. . Preventing infections: Shower with an antibacterial soap the morning of your procedure. . Build-up your immune system: Take 1000 mg of Vitamin C with every meal (3 times a day) the day prior to your procedure. Marland Kitchen Antibiotics: Inform the staff if you have a condition or reason that requires you to take antibiotics before dental procedures. . Pregnancy: If you are pregnant, call and cancel the procedure. . Sickness: If you have a cold, fever, or any active infections, call and cancel the procedure. . Arrival: You must be in the facility at least 30 minutes prior to your scheduled procedure. . Children: Do not bring children with you. Percell Miller appropriately:  Bring dark clothing that you would not mind if they get stained. . Valuables: Do not bring any jewelry or valuables.  Procedure appointments are reserved for interventional treatments only. Marland Kitchen No Prescription Refills. . No medication changes will be discussed during procedure appointments. . No disability issues will be discussed.  Remember:  Regular Business hours are:  Monday to Thursday 8:00 AM to 4:00 PM  Provider's Schedule: Milinda Pointer, MD:  Procedure days: Tuesday and Thursday 7:30 AM to 4:00 PM  Gillis Santa, MD:  Procedure days: Monday and Wednesday 7:30 AM to 4:00 PM ____________________________________________________________________________________________

## 2017-05-01 ENCOUNTER — Encounter: Payer: Self-pay | Admitting: *Deleted

## 2017-05-01 ENCOUNTER — Telehealth: Payer: Self-pay

## 2017-05-01 ENCOUNTER — Telehealth: Payer: Self-pay | Admitting: Neurology

## 2017-05-01 NOTE — Telephone Encounter (Signed)
I was speaking with Pt concerning her Medicare drug coverage and changes to Medicaid status for Botox authorization.  Pt wanted to let Dr. 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 She is taking phentermine to help her lose 65 lbs she is required to lose prior to cervical surgery.

## 2017-05-01 NOTE — Progress Notes (Signed)
Pt returned my call. She has MCR primary. Her MCR part D medication coverage is: [facility] Rx PCN and Rx Group # [id number]  Started another PA for Botox on cover my meds key: [id number]

## 2017-05-01 NOTE — Telephone Encounter (Signed)
Patient called to let you know that her migraines have been "out of control as well as very Nauseated". She said she wanted to let Dr. Delice Lesch.what's going on. Thanks

## 2017-05-02 NOTE — Telephone Encounter (Signed)
Spoke with pt relaying message below.  She states that she has enough on hand for increase until her next appointment with Dr. Delice Lesch.

## 2017-05-02 NOTE — Telephone Encounter (Signed)
Hopefully she can get the Botox soon. In the meantime, pls have her increase the Topamax 100mg : Take 1.5 tabs BID. Thanks

## 2017-05-05 ENCOUNTER — Telehealth: Payer: Self-pay

## 2017-05-05 NOTE — Progress Notes (Addendum)
Submitted Botox PA online. Rcvd approval from [facility] valid through 01/06/2018 PA Case: [id number], Status: Approved, Coverage Starts on: 05/02/2017 12:00:00 AM, Coverage Ends on: 01/06/2018 12:00:00 AM.

## 2017-05-05 NOTE — Telephone Encounter (Signed)
Called and advsd Pt Botox was approved, made appointment for next Botox day 06/06/17

## 2017-05-12 ENCOUNTER — Encounter: Payer: Self-pay | Admitting: Neurology

## 2017-05-12 ENCOUNTER — Ambulatory Visit (INDEPENDENT_AMBULATORY_CARE_PROVIDER_SITE_OTHER): Payer: Medicare Other | Admitting: Neurology

## 2017-05-12 VITALS — BP 104/70 | HR 109 | Wt 262.4 lb

## 2017-05-12 DIAGNOSIS — G43109 Migraine with aura, not intractable, without status migrainosus: Secondary | ICD-10-CM | POA: Diagnosis not present

## 2017-05-12 DIAGNOSIS — F445 Conversion disorder with seizures or convulsions: Secondary | ICD-10-CM | POA: Diagnosis not present

## 2017-05-12 DIAGNOSIS — G35 Multiple sclerosis: Secondary | ICD-10-CM | POA: Diagnosis not present

## 2017-05-12 DIAGNOSIS — G43709 Chronic migraine without aura, not intractable, without status migrainosus: Secondary | ICD-10-CM

## 2017-05-12 MED ORDER — DIVALPROEX SODIUM 500 MG PO DR TAB: 500.0000 mg | DELAYED_RELEASE_TABLET | Freq: Every day | ORAL | 3 refills | Status: DC

## 2017-05-12 MED ORDER — ZOLMITRIPTAN 5 MG NA SOLN: NASAL | 5 refills | Status: DC

## 2017-05-12 MED ORDER — METOCLOPRAMIDE HCL 5 MG/ML IJ SOLN
10.0000 mg | Freq: Once | INTRAMUSCULAR | Status: AC
  Administered 2017-05-12: 10 mg via INTRAMUSCULAR

## 2017-05-12 MED ORDER — METOCLOPRAMIDE HCL 10 MG PO TABS: ORAL_TABLET | ORAL | 6 refills | Status: DC

## 2017-05-12 MED ORDER — KETOROLAC TROMETHAMINE 60 MG/2ML IM SOLN
60.0000 mg | Freq: Once | INTRAMUSCULAR | Status: AC
  Administered 2017-05-12: 60 mg via INTRAMUSCULAR

## 2017-05-12 MED ORDER — DIPHENHYDRAMINE HCL 50 MG/ML IJ SOLN
25.0000 mg | Freq: Once | INTRAMUSCULAR | Status: AC
  Administered 2017-05-12: 25 mg via INTRAMUSCULAR

## 2017-05-12 MED ORDER — TOPIRAMATE 100 MG PO TABS: ORAL_TABLET | ORAL | 3 refills | Status: DC

## 2017-05-12 MED ORDER — FINGOLIMOD HCL 0.5 MG PO CAPS: 0.5000 mg | ORAL_CAPSULE | Freq: Every day | ORAL | 10 refills | Status: DC

## 2017-05-12 NOTE — Patient Instructions (Addendum)
1. Schedule repeat MRI brain with and without contrast 2. Proceed with Botox as scheduled 3. Continue Topamax 100mg : Take 1.5 tablets twice a day and Depakote ER 500mg  every night 4. Continue follow-up with your psychiatrist and pain specialist 5. Follow-up in 6 months, call for any changes

## 2017-05-12 NOTE — Progress Notes (Signed)
NEUROLOGY FOLLOW UP OFFICE NOTE  Emily [name] [id number]  DOB: 07-29-1980  HISTORY OF PRESENT ILLNESS: I had the pleasure of seeing Emily [name] in follow-up in the neurology clinic on 05/12/2017.  The patient was last seen 5 months ago for worsening of migraines. She has several neurological conditions. She has a history of MS and stopped Gilenya during pregnancy. It took a while to finally get her back on the medication, but she was finally restarted and continues to tolerate it well without side effects or evidence of MS flares. She has been calling our office recently due to an increase in migraines. She had previously been getting Botox but had some coverage changes, she is set up for Botox at the end of the month. Topamax dose has been increase to 150mg  BID. She is also on Depakote 500mg  qhs. She reports daily migraines with nausea when she wakes up in the morning. The Imitrex has not been helping. She also takes Reglan for nausea. She is tearful in the office today regarding her chronic pain. She has been seeing a Psychologist, sport and exercise in [place] who has recommended cervical surgery for her neck pain, MRI cervical spine in 02/2017 showed chronic but progressed C6-7 disc and endplate degeneration with marrow edema, leftward disc herniation appears increased with associated spinal stenosis and spinal cord mass effect, moderate bilateral neural foraminal stenosis, query left C7 radiculitis. She states that surgery has been offered however she needs to lose weight first. Her PCP has prescribed Phentermine and she has lost a few pounds, but has difficulty with exercise because she gets tired easily. It is possibly a year before she can have surgery. She shows a note from her PCP that her being on Phentermine is a contraindication to epidural steroid injections, mentioning long-acting opioids. She reports frustration that this cannot be prescribed by her pain specialist, but radiofrequency therapy has been  offered. She was also started on Lyrica, but she notes blurred vision, bilateral hand tremors, and increased nausea, worse with increase to 100mg  TID dosing. She states she has been hurting "really, really bad." She has not had any psychogenic shaking spells in a long time, as long as she takes the daily diazepam. She continues to deal with a lot of stress at home but has been managing taking care of her daughter with the help of her parents.   HPI 03/28/15: This is a pleasant 37 yo RH woman with a history of relapsing-remitting multiple sclerosis, headaches, anxiety with panic attacks, conversion disorder with seizures. She presented at [redacted] weeks pregnant to determine if diazepam for anxiety/seizures would be a medication she can use, understanding this is pregnancy category D versus risks of injuring herself from falls when she has the psychogenic seizures. Records from her previous neurologists at [place] were reviewed. She was diagnosed with MS in June 2008 while living in [place] state, she started having right thigh pain and right eye blurred vision. She was diagnosed with right optic neuritis and underwent MRI brain and lumbar puncture which confirmed a diagnosis of MS. 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 She has been on Gilenya since 2014 without any relapse. This was discontinued in December 2016 when she found out she was pregnant. She has a history of chronic pain and migraines, and was taking Depakote and Topamax for headache prophylaxis, also stopped in December 2016.  She reports having Botox for her migraines, but so far migraines are not as bad as they were.  She started having shaking episodes in April 2016. Initially Depakote dose was increased to 500mg  BID. She was back in May 2016 with multiple episodes of syncope, palpitations, a  sensation of her heart jumping out of her chest. She also reported lightheadedness, facial numbness, and numbness in her hands and feet. On her last visit in June 2016 at [facility], shaking episodes were discussed. Events always occur after a stressful event. She had a spell in [place] where she had a normal EEG and was diagnosed with a conversion disorder. She had seen Dr. Darleene Cleaver and reportedly told her he could not help her because "her case was too complex." She started seeing a psychiatrist in [place], however since she switched neurologists, she reports that he cannot see her anymore. She has not been able to establish care with [facility] and continues to have anxiety and panic attacks "all the time." She is diaphoretic in the office today and reports she always feels anxious. When she starts having one of her seizures, she would have significant anxiety, "my body can't deal and goes into a seizure," she hears her heartbeat in her ears. If it slowly builds up, she can get herself to a safe place, however other times it "just hits me," and she falls to the ground and has injured herself. She reports falling out one time in the bathroom during a doctor visit. She is currently on Fluoxetine and Buspirone. Clonazepam in the past did not help. She would usually go to the ER for these spells, which would quiet down once she gets Benadryl and Diazepam.  She delivered early at [redacted] weeks gestation last July 12, 2015. A week after delivery, she started having numbness on the top of both feet, pain in the balls of her feet, with numbness traveling up to her mid-back. She also has numbness in both forearms, and reports her palms feel the same as her soles, with constant stabbing burning pain. She had these symptoms pre-pregnancy but states the numbness only went up to her knees. During pregnancy, all her symptoms were less bothersome. She has been following with Pain management in [facility] and would  occasionally take Oxycodone prior to her pregnancy.  Diagnostic Data:  I personally reviewed MRI brain with and without contrast done with [facility] last 10/02/13 which did not show any acute changes, there was multiple T2/FLAIR lesions in the subcortical and deep white matter regions, no abnormal enhancement. Hippocampi appear asymmetric, smaller on the right without abnormal signal or enhancement.  MRI C-spine with and without contrast 10/18/13 showed hazy T2 hyperintensity within the cord from C2-3 to C4-5 without abnormal enhancement.  per [facility] - MRI brain done 03/18/14: "No change in numerous multifocal T2 hyperintense white matter lesions relative to prior MRI from 04/17/2013 most consistent with demyelinating lesions given history of multiple sclerosis. No evidence of active demyelination."  MRI cervical spine done 03/18/14: "Relative to prior MRI from 10/20/2009, no change in patchy foci of increased T2 signal in the dorsal cord at C2- C4 in keeping with demyelinating lesions in this patient with a clinical history of multiple sclerosis.  No evidence of enhancement to suggest active demyelination. Moderately advanced degenerative disease at C5-C6 and C6-C7 with interval development of left eccentric central, foraminal entry zone and foraminal disc extrusion at C6-C7 exerting mass effect on the exiting C7 and descending C8 roots.  MRI brain  and C-spine 07/30/2015 with and without contrast : There was no abnormal enhancement. There were no changes from previous MRI brain in 09/2013 with multiple bilateral subcortical greater than 10 periventricular T2 hyperintensities. There were no cord lesions seen up to T3-4. There was chronic degenerative disc disease and central canal stenosis at C5-6 and C6-7, no abnormal cord signal, unchanged from 06/2014.  EMG/NCV done on both UE and right LE showed bilateral carpal tunnel syndrome, mild to moderate in degree, no evidence of polyneuropathy or radiculopathy on  the right side.  PAST MEDICAL HISTORY: Past Medical History:  Diagnosis Date  . Anxiety   . Asthma as a child  . Chronic back pain   . Chronic pain   . Conversion disorder   . Depression   . GERD (gastroesophageal reflux disease)   . Headache(784.0)    migraines  . HTN (hypertension) 02/27/2015  . JC virus antibody positive   . Migraines   . MS (multiple sclerosis) ([place])    06-2006  . Multiple sclerosis ([place])   . Ovarian cyst   . Panic attack   . Perforated bowel ([place]) 2009  . Pseudoseizures   . PTSD (post-traumatic stress disorder)   . S/P emergency C-section   . Urinary urgency     MEDICATIONS: Current Outpatient Medications on File Prior to Visit  Medication Sig Dispense Refill  . albuterol (PROVENTIL HFA;VENTOLIN HFA) 108 (90 Base) MCG/ACT inhaler Inhale 2 puffs into the lungs every 6 (six) hours as needed for wheezing or shortness of breath. (Patient taking differently: Inhale 2 puffs into the lungs every 4 (four) hours as needed for wheezing or shortness of breath. ) 1 Inhaler 0  . amLODipine (NORVASC) 5 MG tablet Take 5 mg by mouth every evening.  2  . Calcium Carb-Cholecalciferol (CALCIUM PLUS D3 ABSORBABLE) [phone] MG-UNIT CAPS Take 1 capsule by mouth daily with breakfast. 90 capsule 0  . diazepam (VALIUM) 5 MG tablet Take 1 tablet (5 mg total) by mouth daily. Take 1 tablet as needed for panic attacks leading to seizures. 30 tablet 3  . diphenhydrAMINE (SOMINEX) 25 MG tablet Take 2 tablets (50 mg total) by mouth at bedtime as needed for sleep. (Patient taking differently: Take 50 mg by mouth at bedtime. ) 60 tablet 3  . divalproex (DEPAKOTE) 500 MG DR tablet Take 1 tablet (500 mg total) by mouth at bedtime. 90 tablet 3  . EPINEPHrine (EPIPEN) 0.3 mg/0.3 mL SOAJ injection Inject 0.3 mg into the muscle as needed (allergic reaction). Reported on 03/28/2015    . etonogestrel (NEXPLANON) 68 MG IMPL implant 1 each by Subdermal route once.    Marland Kitchen FLUoxetine (PROZAC) 40 MG  capsule Take 2 capsules (80 mg total) by mouth daily. 60 capsule 3  . GILENYA 0.5 MG CAPS TAKE 1 CAPSULE BY MOUTH EVERY DAY 30 capsule 10  . hydrOXYzine (VISTARIL) 25 MG capsule Take 1 capsule (25 mg total) by mouth 2 (two) times daily. 60 capsule 2  . ibuprofen (ADVIL,MOTRIN) 600 MG tablet Take 600 mg by mouth daily as needed for moderate pain.    Marland Kitchen loratadine (CLARITIN) 10 MG tablet Take 10 mg by mouth daily as needed for allergies.    . methocarbamol (ROBAXIN) 750 MG tablet Take 1 tablet (750 mg total) by mouth 2 (two) times daily. 180 tablet 0  . metoCLOPramide (REGLAN) 10 MG tablet Take as needed 10 tablet 0  . Multiple Vitamins-Minerals (MULTIVITAMIN ADULT PO) Take 1 tablet by mouth daily.     Marland Kitchen  OnabotulinumtoxinA (BOTOX IJ) Inject as directed every 3 (three) months.    . phentermine 37.5 MG capsule Take 37.5 mg by mouth every morning.    . pregabalin (LYRICA) 100 MG capsule Take 1 capsule (100 mg total) by mouth 3 (three) times daily. 90 capsule 0  . ranitidine (ZANTAC) 300 MG tablet Take 300 mg by mouth at bedtime.     . SUMAtriptan (IMITREX) 100 MG tablet Take 1 tablet (100 mg total) by mouth every 2 (two) hours as needed for migraine. May repeat in 2 hours if headache persists or recurs. 9 tablet 6  . topiramate (TOPAMAX) 100 MG tablet Take 1 tablet twice daily 180 tablet 3   Current Facility-Administered Medications on File Prior to Visit  Medication Dose Route Frequency Provider Last Rate Last Dose  . metoCLOPramide (REGLAN) 10 mg in dextrose 5 % 50 mL IVPB  10 mg Intravenous Once Cameron Sprang, MD      . metoCLOPramide (REGLAN) injection 10 mg  10 mg Intravenous Once Milton Ferguson, MD        ALLERGIES: Allergies  Allergen Reactions  . Amitriptyline Hypertension and Other (See Comments)    hypertension  . Baclofen Hives and Shortness Of Breath  . Duloxetine Hcl Shortness Of Breath and Rash  . Gabapentin Shortness Of Breath and Rash  . Hydrocodone-Acetaminophen Hives and  Nausea And Vomiting    Projectile vomiting  . Magnesium Salicylate Hives and Itching  . Monosodium Glutamate Anaphylaxis  . Other Shortness Of Breath, Rash and Other (See Comments)    MSG, beans (vomiting) MSG causes hives, itching, throat swelling  . Tizanidine Hives    Other reaction(s): GI Upset (intolerance)  . Alprazolam Other (See Comments)    Lethargy  . Rizatriptan Nausea And Vomiting and Other (See Comments)    GI upset, Projectile vomiting GI upset, Projectile vomiting  . Vicodin [Hydrocodone-Acetaminophen] Hives, Nausea And Vomiting and Other (See Comments)    Projectile vomiting  . Adhesive [Tape] Other (See Comments)    Skin irritation Paper tape is ok  . Ketorolac Tromethamine Nausea Only  . Lamotrigine Rash  . Tramadol Nausea And Vomiting    Other reaction(s): GI Upset (intolerance)    FAMILY HISTORY: Family History  Problem Relation Age of Onset  . Diabetes Mother   . Hypertension Mother   . Diabetes Father   . Hypertension Father   . Arthritis Father   . Cancer Maternal Grandmother   . Cancer Maternal Grandfather   . Alcohol abuse Neg Hx   . Anxiety disorder Neg Hx   . Bipolar disorder Neg Hx   . Drug abuse Neg Hx   . Depression Neg Hx     SOCIAL HISTORY: Social History   Socioeconomic History  . Marital status: Single    Spouse name: Not on file  . Number of children: 0  . Years of education: College  . Highest education level: Not on file  Occupational History    Employer: OTHER    Comment: disability  Social Needs  . Financial resource strain: Not on file  . Food insecurity:    Worry: Not on file    Inability: Not on file  . Transportation needs:    Medical: Not on file    Non-medical: Not on file  Tobacco Use  . Smoking status: Former Smoker    Packs/day: 0.25    Years: 10.00    Pack years: 2.50    Types: Cigarettes    Last attempt to  quit: 07/12/2015    Years since quitting: 1.8  . Smokeless tobacco: Never Used  Substance and  Sexual Activity  . Alcohol use: No    Alcohol/week: 0.0 oz    Comment: Rare  . Drug use: No  . Sexual activity: Not Currently    Birth control/protection: None  Lifestyle  . Physical activity:    Days per week: Not on file    Minutes per session: Not on file  . Stress: Not on file  Relationships  . Social connections:    Talks on phone: Not on file    Gets together: Not on file    Attends religious service: Not on file    Active member of club or organization: Not on file    Attends meetings of clubs or organizations: Not on file    Relationship status: Not on file  . Intimate partner violence:    Fear of current or ex partner: Not on file    Emotionally abused: Not on file    Physically abused: Not on file    Forced sexual activity: Not on file  Other Topics Concern  . Not on file  Social History Narrative   Patient lives at home with fiance in [place]. Born and raised in [place], [place] by parents. Pt has one younger sister. Pt has a Best boy. Pt worked from 1999-2006. She stopped due to MS and is currently on disability. Pt is pregnant with her 1st child. Married for less than 1 yr in 1999 that ended in divorce.             Caffeine Use: 1 20oz soda daily    REVIEW OF SYSTEMS: Constitutional: No fevers, chills, or sweats, + generalized fatigue, no change in appetite Eyes: as above, no double vision, eye pain Ear, nose and throat: No hearing loss, ear pain, nasal congestion, sore throat Cardiovascular: No chest pain, palpitations Respiratory:  No shortness of breath at rest or with exertion, wheezes GastrointestinaI: No nausea, vomiting, diarrhea, abdominal pain, fecal incontinence Genitourinary:  No dysuria, urinary retention, +urinary frequency Musculoskeletal:  + neck pain, back pain Integumentary: No rash, pruritus, skin lesions Neurological: as above Psychiatric: + depression, anxiety Endocrine: No palpitations, fatigue, diaphoresis, mood  swings, change in appetite, change in weight, increased thirst Hematologic/Lymphatic:  No anemia, purpura, petechiae. Allergic/Immunologic: no itchy/runny eyes, nasal congestion, recent allergic reactions, rashes  PHYSICAL EXAM: Vitals:   05/12/17 1145  BP: 104/70  Pulse: (!) 109  SpO2: 96%   General: No acute distress, tearful Head: Normocephalic/atraumatic Eyes: Fundoscopic exam shows bilateral sharp discs, no vessel changes, exudates, or hemorrhages Neck: supple, no paraspinal tenderness, full range of motion Back: No paraspinal tenderness Heart: regular rate and rhythm Lungs: Clear to auscultation bilaterally. Vascular: No carotid bruits. Skin/Extremities: No rash, no edema, wearing bilateral wrist brace Neurological Exam: Mental status: alert and oriented to person, place, and time, no dysarthria or aphasia, Fund of knowledge is appropriate. Recent and remote memory are intact. Attention and concentration are normal. Able to name objects and repeat phrases. Cranial nerves: CN I: not tested CN II: pupils equal, round and reactive to light,no APD, visual fields intact CN III, IV, VI: full range of motion, no nystagmus, no ptosis CN V: facial sensation intact CN VII: upper and lower face symmetric CN VIII: hearing intact to finger rub CN IX, X: gag intact, uvula midline CN XI: sternocleidomastoid and trapezius muscles intact CN XII: tongue midline Bulk & Tone: normal, no fasciculations. Motor:  5/5 throughout with no pronator drift. Sensation: intact to light touch Deep Tendon Reflexes: +2 throughout, no ankle clonus Plantar responses: downgoing bilaterally Cerebellar: no incoordination on finger to nose testing Gait: slightly wide-based, no ataxia Tremor: bilateral high amplitude hand tremors  IMPRESSION: This is a 37 yo RH woman with a history of relapsing-remitting multiple sclerosis, migraine with aura, anxiety with panic attacks, conversion disorder with  psychogenic seizures. Her MS has been stable, no new symptoms, she continues on Gilenya with no MS flares, no side effects. Surveillance MRI brain with and without contrast will be ordered, last MRI brain was in 2017. She has chronic migraines which has worsened since Botox was stopped, this has been reinstated and scheduled for the end of the month. Continue Topamax 150mg  BID and Depakote 500mg  qhs. She will be given a migraine cocktail today. She reports Imitrex is not helping, she has tried rizatriptan in the past with side effects, we will try Zomig nasal spray, side effects were discussed. She has prn Reglan 10mg  for nausea. She has not had any psychogenic shaking spells in a long time, continue follow-up with psychiatry and therapy. Continue follow-up with pain management for chronic pain, she will discuss side effects on Lyrica and was encouraged to proceed with radiofrequency treatment. She will follow-up in 6 months and knows to call for any changes.   Thank you for allowing me to participate in her care.  Please do not hesitate to call for any questions or concerns.  The duration of this appointment visit was 25 minutes of face-to-face time with the patient.  Greater than 50% of this time was spent in counseling, explanation of diagnosis, planning of further management, and coordination of care.   Ellouise Newer, M.D.   CC: Dr. Manuella Ghazi

## 2017-05-13 ENCOUNTER — Ambulatory Visit: Payer: Self-pay | Admitting: Pain Medicine

## 2017-05-13 DIAGNOSIS — M47812 Spondylosis without myelopathy or radiculopathy, cervical region: Secondary | ICD-10-CM | POA: Diagnosis not present

## 2017-05-15 ENCOUNTER — Encounter (HOSPITAL_COMMUNITY): Payer: Self-pay | Admitting: Psychiatry

## 2017-05-15 ENCOUNTER — Telehealth: Payer: Self-pay | Admitting: Neurology

## 2017-05-15 ENCOUNTER — Ambulatory Visit (INDEPENDENT_AMBULATORY_CARE_PROVIDER_SITE_OTHER): Payer: Medicare Other | Admitting: Psychiatry

## 2017-05-15 DIAGNOSIS — F411 Generalized anxiety disorder: Secondary | ICD-10-CM

## 2017-05-15 DIAGNOSIS — M549 Dorsalgia, unspecified: Secondary | ICD-10-CM | POA: Diagnosis not present

## 2017-05-15 DIAGNOSIS — Z87891 Personal history of nicotine dependence: Secondary | ICD-10-CM | POA: Diagnosis not present

## 2017-05-15 DIAGNOSIS — F445 Conversion disorder with seizures or convulsions: Secondary | ICD-10-CM

## 2017-05-15 DIAGNOSIS — F4310 Post-traumatic stress disorder, unspecified: Secondary | ICD-10-CM | POA: Diagnosis not present

## 2017-05-15 DIAGNOSIS — F4001 Agoraphobia with panic disorder: Secondary | ICD-10-CM | POA: Diagnosis not present

## 2017-05-15 DIAGNOSIS — F332 Major depressive disorder, recurrent severe without psychotic features: Secondary | ICD-10-CM

## 2017-05-15 DIAGNOSIS — M2550 Pain in unspecified joint: Secondary | ICD-10-CM

## 2017-05-15 MED ORDER — BUSPIRONE HCL 15 MG PO TABS: 15.0000 mg | ORAL_TABLET | Freq: Two times a day (BID) | ORAL | 3 refills | Status: DC

## 2017-05-15 MED ORDER — DIAZEPAM 5 MG PO TABS: 5.0000 mg | ORAL_TABLET | Freq: Every day | ORAL | 3 refills | Status: DC

## 2017-05-15 MED ORDER — DIPHENHYDRAMINE HCL (SLEEP) 25 MG PO TABS: 50.0000 mg | ORAL_TABLET | Freq: Every evening | ORAL | 3 refills | Status: DC | PRN

## 2017-05-15 MED ORDER — FLUOXETINE HCL 40 MG PO CAPS
80.0000 mg | ORAL_CAPSULE | Freq: Every day | ORAL | 3 refills | Status: DC
Start: 2017-05-15 — End: 2017-08-21
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
## 2017-05-15 NOTE — Telephone Encounter (Signed)
Patient wants to see if we can send her to a different orthopedic. She wants to talk to some about this and why this request is being asked  For

## 2017-05-15 NOTE — Telephone Encounter (Signed)
Patient states that she was giving a RX (she does not know the name of the medication ) nasal spray but it is 500.00 and she cant afford that.  Please call patient

## 2017-05-15 NOTE — Telephone Encounter (Signed)
Spoke with pt.  She states that in March she was told by Neurosurgery that surgery would be required for her neck pain. Pt now states that she is being told that surgery would only be elective and that she would benefit from PT over surgery.  Pt does not agree with this and would like referral for second opinion.    Dr. Delice Lesch - OK to send referral?       Pt also states that the pharmacy told her that her Zomig would cost between $500-600.  Advised I would initiate prior auth.

## 2017-05-16 NOTE — Telephone Encounter (Signed)
Unable to initiate PA.  [facility] states that they cannot find pt in their system to start PA.  Please advise.

## 2017-05-16 NOTE — Telephone Encounter (Signed)
Pls let her know that it appears there is no option for prior auth, let's just wait and hope that she would not need it once Botox back in place. Thanks

## 2017-05-16 NOTE — Telephone Encounter (Signed)
Ok to send referral to Neurosurg. Let's try prior auth since she has already tried preferred meds. Thanks

## 2017-05-20 DIAGNOSIS — Z299 Encounter for prophylactic measures, unspecified: Secondary | ICD-10-CM | POA: Diagnosis not present

## 2017-05-20 DIAGNOSIS — Z6841 Body Mass Index (BMI) 40.0 and over, adult: Secondary | ICD-10-CM | POA: Diagnosis not present

## 2017-05-20 DIAGNOSIS — I10 Essential (primary) hypertension: Secondary | ICD-10-CM | POA: Diagnosis not present

## 2017-05-20 DIAGNOSIS — Z713 Dietary counseling and surveillance: Secondary | ICD-10-CM | POA: Diagnosis not present

## 2017-05-26 ENCOUNTER — Telehealth: Payer: Self-pay | Admitting: Neurology

## 2017-05-26 NOTE — Telephone Encounter (Signed)
[facility] information given to pt.

## 2017-05-26 NOTE — Telephone Encounter (Signed)
Patient needs the phone number to the MRI place where we sent the referral. She states that it is ok to give the information to her mother if she answers the phone

## 2017-05-26 NOTE — Progress Notes (Signed)
Called [facility] to arrange for office delivery, spoke with Charlena Cross, trx to Pharmacist Aaron Edelman, he took verbal Rx.  Called [facility], adv sdher to call [phone]

## 2017-05-27 ENCOUNTER — Ambulatory Visit (HOSPITAL_BASED_OUTPATIENT_CLINIC_OR_DEPARTMENT_OTHER): Payer: Medicare Other | Admitting: Pain Medicine

## 2017-05-27 ENCOUNTER — Other Ambulatory Visit: Payer: Self-pay

## 2017-05-27 ENCOUNTER — Encounter: Payer: Self-pay | Admitting: Pain Medicine

## 2017-05-27 ENCOUNTER — Ambulatory Visit
Admission: RE | Admit: 2017-05-27 | Discharge: 2017-05-27 | Disposition: A | Discharge status: Home / Self Care | Payer: Medicare Other | Source: Ambulatory Visit | Attending: Pain Medicine | Admitting: Pain Medicine

## 2017-05-27 VITALS — BP 126/80 | HR 79 | Temp 98.5°F | Resp 14 | Ht 64.0 in | Wt 259.0 lb

## 2017-05-27 DIAGNOSIS — Z79899 Other long term (current) drug therapy: Secondary | ICD-10-CM | POA: Diagnosis not present

## 2017-05-27 DIAGNOSIS — G8929 Other chronic pain: Secondary | ICD-10-CM

## 2017-05-27 DIAGNOSIS — M5030 Other cervical disc degeneration, unspecified cervical region: Secondary | ICD-10-CM | POA: Insufficient documentation

## 2017-05-27 DIAGNOSIS — R51 Headache: Secondary | ICD-10-CM

## 2017-05-27 DIAGNOSIS — M47812 Spondylosis without myelopathy or radiculopathy, cervical region: Secondary | ICD-10-CM | POA: Insufficient documentation

## 2017-05-27 DIAGNOSIS — Z9889 Other specified postprocedural states: Secondary | ICD-10-CM | POA: Diagnosis not present

## 2017-05-27 DIAGNOSIS — Z885 Allergy status to narcotic agent status: Secondary | ICD-10-CM | POA: Diagnosis not present

## 2017-05-27 DIAGNOSIS — Z888 Allergy status to other drugs, medicaments and biological substances status: Secondary | ICD-10-CM | POA: Insufficient documentation

## 2017-05-27 DIAGNOSIS — M542 Cervicalgia: Secondary | ICD-10-CM | POA: Diagnosis not present

## 2017-05-27 DIAGNOSIS — G4486 Cervicogenic headache: Secondary | ICD-10-CM

## 2017-05-27 MED ORDER — MIDAZOLAM HCL 5 MG/5ML IJ SOLN
1.0000 mg | INTRAMUSCULAR | Status: DC | PRN
  Administered 2017-05-27: 3 mg via INTRAVENOUS
  Filled 2017-05-27: qty 5

## 2017-05-27 MED ORDER — LIDOCAINE HCL 2 % IJ SOLN
20.0000 mL | Freq: Once | INTRAMUSCULAR | Status: AC
  Administered 2017-05-27: 400 mg
  Filled 2017-05-27: qty 40

## 2017-05-27 MED ORDER — GLYCOPYRROLATE 0.2 MG/ML IJ SOLN
INTRAMUSCULAR | Status: AC
  Filled 2017-05-27: qty 1

## 2017-05-27 MED ORDER — GLYCOPYRROLATE 0.2 MG/ML IJ SOLN
0.2000 mg | Freq: Once | INTRAMUSCULAR | Status: AC
  Administered 2017-05-27: 0.2 mg via INTRAVENOUS

## 2017-05-27 MED ORDER — FENTANYL CITRATE (PF) 100 MCG/2ML IJ SOLN
25.0000 ug | INTRAMUSCULAR | Status: DC | PRN
  Administered 2017-05-27: 100 ug via INTRAVENOUS
  Filled 2017-05-27: qty 2

## 2017-05-27 MED ORDER — LACTATED RINGERS IV SOLN
1000.0000 mL | Freq: Once | INTRAVENOUS | Status: AC
  Administered 2017-05-27: 1000 mL via INTRAVENOUS

## 2017-05-27 MED ORDER — ROPIVACAINE HCL 2 MG/ML IJ SOLN
10.0000 mL | Freq: Once | INTRAMUSCULAR | Status: AC
  Administered 2017-05-27: 10 mL via PERINEURAL
  Filled 2017-05-27: qty 10

## 2017-05-27 NOTE — Patient Instructions (Signed)

## 2017-05-27 NOTE — Progress Notes (Signed)
Safety precautions to be maintained throughout the outpatient stay will include: orient to surroundings, keep bed in low position, maintain call bell within reach at all times, provide assistance with transfer out of bed and ambulation.  

## 2017-05-27 NOTE — Progress Notes (Signed)
Patient's Name: Emily [name]  MRN: [id number]  Referring Provider: Monico Blitz, MD  DOB: [birth year]  PCP: Monico Blitz, MD  DOS: 05/27/2017  Note by: Gaspar Cola, MD  Service setting: Ambulatory outpatient  Specialty: Interventional Pain Management  Patient type: Established  Location: [facility] (AMB) Pain Management Facility  Visit type: Interventional Procedure   Primary Reason for Visit: Interventional Pain Management Treatment. CC: Neck Pain  Procedure:       Anesthesia, Analgesia, Anxiolysis:  Type: Cervical Facet Medial Branch Block(s) #1 (NO STEROIDS)  Primary Purpose: Diagnostic Region: Posterolateral cervical spine Level: C3, C4, C5, C6, & C7 Medial Branch Level(s). Injecting these levels blocks the C3-4, C4-5, C5-6, and C6-7 cervical facet joints. Laterality: Bilateral Paraspinal  Type: Moderate (Conscious) Sedation combined with Local Anesthesia Indication(s): Analgesia and Anxiety Route: Intravenous (IV) IV Access: Secured Sedation: Meaningful verbal contact was maintained at all times during the procedure  Local Anesthetic: Lidocaine 1-2%   Indications: 1. Chronic neck pain (Primary Area of Pain) (Bilateral) (R>L)   2. Cervical facet syndrome (Bilateral) (R>L)   3. Cervical facet hypertrophy   4. Cervicogenic headache   5. DDD (degenerative disc disease), cervical    Pain Score: Pre-procedure: 9 /10 Post-procedure: 0-No pain/10  Pre-op Assessment:  Emily [name] is a 37 y.o. (year old), female patient, seen today for interventional treatment. 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 Emily [name] has a current medication list which includes the following prescription(s): albuterol, amlodipine, buspirone, diazepam, diphenhydramine, divalproex, epinephrine, etonogestrel, fingolimod hcl, fluoxetine, hydroxyzine, ibuprofen, loratadine, methocarbamol, metoclopramide, multiple vitamins-minerals, onabotulinumtoxina, phentermine, pregabalin, ranitidine, topiramate, zolmitriptan, and calcium plus d3 absorbable, and the following Facility-Administered Medications: fentanyl, metoCLOPramide (REGLAN) 10 mg in dextrose 5 % 50 mL IVPB, metoclopramide, and midazolam. Her primarily concern today is the Neck Pain  Initial Vital Signs:  Pulse/HCG Rate: 79ECG Heart Rate: 83 Temp: 97.7 F (36.5 C) Resp: 18 BP: (!) 128/97 SpO2: 100 %  BMI: Estimated body mass index is 44.46 kg/m as calculated from the following:   Height as of this encounter: 5\' 4"  (1.626 m).   Weight as of this encounter: 259 lb (117.5 kg).  Risk Assessment: Allergies: Reviewed. She is allergic to amitriptyline; baclofen; duloxetine hcl; gabapentin; hydrocodone-acetaminophen; magnesium salicylate; monosodium glutamate; other; tizanidine; alprazolam; rizatriptan; vicodin [hydrocodone-acetaminophen]; adhesive [tape]; ketorolac tromethamine; lamotrigine; and tramadol.  Allergy Precautions: None required Coagulopathies: Reviewed. None identified.  Blood-thinner therapy: None at this time Active Infection(s): Reviewed. None identified. Emily [name] is afebrile  Site Confirmation: Emily [name] was asked to confirm the procedure and laterality before marking the site Procedure checklist: Completed Consent: Before the procedure and under the influence of no sedative(s), amnesic(s), or anxiolytics, the patient was informed of the treatment options, risks and possible complications. To fulfill our ethical and legal obligations, as recommended by the American Medical Association's Code of Ethics, I have informed the patient of my  clinical impression; the nature and purpose of the treatment or procedure; the risks, benefits, and possible complications of the intervention; the alternatives, including doing nothing; the risk(s) and benefit(s) of the alternative treatment(s) or procedure(s); and the risk(s) and benefit(s) of doing nothing. The patient was provided information about the general risks and possible complications  associated with the procedure. These may include, but are not limited to: failure to achieve desired goals, infection, bleeding, organ or nerve damage, allergic reactions, paralysis, and death. In addition, the patient was informed of those risks and complications associated to Spine-related procedures, such as failure to decrease pain; infection (i.e.: Meningitis, epidural or intraspinal abscess); bleeding (i.e.: epidural hematoma, subarachnoid hemorrhage, or any other type of intraspinal or peri-dural bleeding); organ or nerve damage (i.e.: Any type of peripheral nerve, nerve root, or spinal cord injury) with subsequent damage to sensory, motor, and/or autonomic systems, resulting in permanent pain, numbness, and/or weakness of one or several areas of the body; allergic reactions; (i.e.: anaphylactic reaction); and/or death. Furthermore, the patient was informed of those risks and complications associated with the medications. These include, but are not limited to: allergic reactions (i.e.: anaphylactic or anaphylactoid reaction(s)); adrenal axis suppression; blood sugar elevation that in diabetics may result in ketoacidosis or comma; water retention that in patients with history of congestive heart failure may result in shortness of breath, pulmonary edema, and decompensation with resultant heart failure; weight gain; swelling or edema; medication-induced neural toxicity; particulate matter embolism and blood vessel occlusion with resultant organ, and/or nervous system infarction; and/or aseptic necrosis of one or  more joints. Finally, the patient was informed that Medicine is not an exact science; therefore, there is also the possibility of unforeseen or unpredictable risks and/or possible complications that may result in a catastrophic outcome. The patient indicated having understood very clearly. We have given the patient no guarantees and we have made no promises. Enough time was given to the patient to ask questions, all of which were answered to the patient's satisfaction. Emily [name] has indicated that she wanted to continue with the procedure. Attestation: I, the ordering provider, attest that I have discussed with the patient the benefits, risks, side-effects, alternatives, likelihood of achieving goals, and potential problems during recovery for the procedure that I have provided informed consent. Date  Time: 05/27/2017  8:49 AM  Pre-Procedure Preparation:  Monitoring: As per clinic protocol. Respiration, ETCO2, SpO2, BP, heart rate and rhythm monitor placed and checked for adequate function Safety Precautions: Patient was assessed for positional comfort and pressure points before starting the procedure. Time-out: I initiated and conducted the "Time-out" before starting the procedure, as per protocol. The patient was asked to participate by confirming the accuracy of the "Time Out" information. Verification of the correct person, site, and procedure were performed and confirmed by me, the nursing staff, and the patient. "Time-out" conducted as per Joint Commission's Universal Protocol (UP.01.01.01). Time: 0941  Description of Procedure:       Position: Prone with head of the table raised to facilitate breathing. Laterality: Bilateral. The procedure was performed in identical fashion on both sides. Level: C3, C4, C5, C6, & C7 Medial Branch Level(s). Area Prepped: Posterior Cervico-thoracic Region Prepping solution: ChloraPrep (2% chlorhexidine gluconate and 70% isopropyl alcohol) Safety Precautions:  Aspiration looking for blood return was conducted prior to all injections. At no point did we inject any substances, as a needle was being advanced. Before injecting, the patient was told to immediately notify me if she was experiencing any new onset of "ringing in the ears, or metallic taste in the mouth". No attempts were made at seeking any paresthesias. Safe injection practices and needle disposal techniques used. Medications properly checked for expiration dates. SDV (single dose vial) medications used. After the completion of the procedure, all disposable equipment used was discarded in the proper  designated Insurance risk surveyor. Local Anesthesia: Protocol guidelines were followed. The patient was positioned over the fluoroscopy table. The area was prepped in the usual manner. The time-out was completed. The target area was identified using fluoroscopy. A 12-in long, straight, sterile hemostat was used with fluoroscopic guidance to locate the targets for each level blocked. Once located, the skin was marked with an approved surgical skin marker. Once all sites were marked, the skin (epidermis, dermis, and hypodermis), as well as deeper tissues (fat, connective tissue and muscle) were infiltrated with a small amount of a short-acting local anesthetic, loaded on a 10cc syringe with a 25G, 1.5-in  Needle. An appropriate amount of time was allowed for local anesthetics to take effect before proceeding to the next step. Local Anesthetic: Lidocaine 2.0% The unused portion of the local anesthetic was discarded in the proper designated containers. Technical explanation of process:  C3 Medial Branch Nerve Block (MBB): The target area for the C3 dorsal medial articular branch is the lateral concave waist of the articular pillar of C3. Under fluoroscopic guidance, a Quincke needle was inserted until contact was made with os over the postero-lateral aspect of the articular pillar of C3 (target area). After  negative aspiration for blood, 0.5 mL of the nerve block solution was injected without difficulty or complication. The needle was removed intact. C4 Medial Branch Nerve Block (MBB): The target area for the C4 dorsal medial articular branch is the lateral concave waist of the articular pillar of C4. Under fluoroscopic guidance, a Quincke needle was inserted until contact was made with os over the postero-lateral aspect of the articular pillar of C4 (target area). After negative aspiration for blood, 0.5 mL of the nerve block solution was injected without difficulty or complication. The needle was removed intact. C5 Medial Branch Nerve Block (MBB): The target area for the C5 dorsal medial articular branch is the lateral concave waist of the articular pillar of C5. Under fluoroscopic guidance, a Quincke needle was inserted until contact was made with os over the postero-lateral aspect of the articular pillar of C5 (target area). After negative aspiration for blood, 0.5 mL of the nerve block solution was injected without difficulty or complication. The needle was removed intact. C6 Medial Branch Nerve Block (MBB): The target area for the C6 dorsal medial articular branch is the lateral concave waist of the articular pillar of C6. Under fluoroscopic guidance, a Quincke needle was inserted until contact was made with os over the postero-lateral aspect of the articular pillar of C6 (target area). After negative aspiration for blood, 0.5 mL of the nerve block solution was injected without difficulty or complication. The needle was removed intact. C7 Medial Branch Nerve Block (MBB): The target for the C7 dorsal medial articular branch lies on the superior-medial tip of the C7 transverse process. Under fluoroscopic guidance, a Quincke needle was inserted until contact was made with os over the postero-lateral aspect of the articular pillar of C7 (target area). After negative aspiration for blood, 0.5 mL of the nerve block  solution was injected without difficulty or complication. The needle was removed intact. Procedural Needles: 22-gauge, 3.5-inch, Quincke needles used for all levels. Nerve block solution: 0.2% PF-Ropivacaine + Triamcinolone (40 mg/mL) diluted to a final concentration of 4 mg of Triamcinolone/mL of Ropivacaine The unused portion of the solution was discarded in the proper designated containers.  Once the entire procedure was completed, the treated area was cleaned, making sure to leave some of the prepping solution back to  take advantage of its long term bactericidal properties.  Vitals:   05/27/17 0954 05/27/17 1000 05/27/17 1010 05/27/17 1020  BP: (!) 144/117 122/83 123/82 126/80  Pulse:      Resp: 15 14 13 14   Temp:  98.3 F (36.8 C)  98.5 F (36.9 C)  TempSrc:      SpO2: 100% 98% 98% 98%  Weight:      Height:        Start Time: 0941 hrs. End Time: 0959 hrs.  Imaging Guidance (Spinal):  Type of Imaging Technique: Fluoroscopy Guidance (Spinal) Indication(s): Assistance in needle guidance and placement for procedures requiring needle placement in or near specific anatomical locations not easily accessible without such assistance. Exposure Time: Please see nurses notes. Contrast: None used. Fluoroscopic Guidance: I was personally present during the use of fluoroscopy. "Tunnel Vision Technique" used to obtain the best possible view of the target area. Parallax error corrected before commencing the procedure. "Direction-depth-direction" technique used to introduce the needle under continuous pulsed fluoroscopy. Once target was reached, antero-posterior, oblique, and lateral fluoroscopic projection used confirm needle placement in all planes. Images permanently stored in EMR. Interpretation: No contrast injected. I personally interpreted the imaging intraoperatively. Adequate needle placement confirmed in multiple planes. Permanent images saved into the patient's record.  Antibiotic  Prophylaxis:   Anti-infectives (From admission, onward)   None     Indication(s): None identified  Post-operative Assessment:  Post-procedure Vital Signs:  Pulse/HCG Rate: 7979 Temp: 98.5 F (36.9 C) Resp: 14 BP: 126/80 SpO2: 98 %  EBL: None  Complications: No immediate post-treatment complications observed by team, or reported by patient.  Note: The patient tolerated the entire procedure well. A repeat set of vitals were taken after the procedure and the patient was kept under observation following institutional policy, for this type of procedure. Post-procedural neurological assessment was performed, showing return to baseline, prior to discharge. The patient was provided with post-procedure discharge instructions, including a section on how to identify potential problems. Should any problems arise concerning this procedure, the patient was given instructions to immediately contact us, at any time, without hesitation. In any case, we plan to contact the patient by telephone for a follow-up status report regarding this interventional procedure.  Comments:  No additional relevant information.  Plan of Care    Imaging Orders     DG C-Arm 1-60 Min-No Report  Procedure Orders     CERVICAL FACET (MEDIAL BRANCH NERVE BLOCK)   Medications ordered for procedure: Meds ordered this encounter  Medications  . lidocaine (XYLOCAINE) 2 % (with pres) injection 400 mg  . midazolam (VERSED) 5 MG/5ML injection 1-2 mg    Make sure Flumazenil is available in the pyxis when using this medication. If oversedation occurs, administer 0.2 mg IV over 15 sec. If after 45 sec no response, administer 0.2 mg again over 1 min; may repeat at 1 min intervals; not to exceed 4 doses (1 mg)  . fentaNYL (SUBLIMAZE) injection 25-50 mcg    Make sure Narcan is available in the pyxis when using this medication. In the event of respiratory depression (RR< 8/min): Titrate NARCAN (naloxone) in increments of 0.1 to 0.2  mg IV at 2-3 minute intervals, until desired degree of reversal.  . lactated ringers infusion 1,000 mL  . glycopyrrolate (ROBINUL) injection 0.2 mg  . ropivacaine (PF) 2 mg/mL (0.2%) (NAROPIN) injection 10 mL   Medications administered: We administered lidocaine, midazolam, fentaNYL, lactated ringers, glycopyrrolate, and ropivacaine (PF) 2 mg/mL (0.2%).  See  the medical record for exact dosing, route, and time of administration.  New Prescriptions   No medications on file   Disposition: Discharge home  Discharge Date & Time: 05/27/2017; 1022 hrs.   Physician-requested Follow-up: Return for post-procedure eval (2 wks), w/ Dr. Dossie Arbour.  Future Appointments  Date Time Provider [place]  06/04/2017  4:50 PM [facility]315 MR 1 [id number] [address] [facility]  06/06/2017 10:00 AM Pieter Partridge, DO [facility] None  06/18/2017 11:15 AM Milinda Pointer, MD [facility] None  08/21/2017  1:30 PM Charlcie Cradle, MD [facility] None  12/02/2017 10:00 AM Cameron Sprang, MD [facility] None   Primary Care Physician: Monico Blitz, MD Location: [facility] Note by: Gaspar Cola, MD Date: 05/27/2017; Time: 10:27 AM  Disclaimer:  Medicine is not an exact science. The only guarantee in medicine is that nothing is guaranteed. It is important to note that the decision to proceed with this intervention was based on the information collected from the patient. The Data and conclusions were drawn from the patient's questionnaire, the interview, and the physical examination. Because the information was provided in large part by the patient, it cannot be guaranteed that it has not been purposely or unconsciously manipulated. Every effort has been made to obtain as much relevant data as possible for this evaluation. It is important to note that the conclusions that lead to this procedure are derived in large part from the available data. Always take into account that the treatment will also be  dependent on availability of resources and existing treatment guidelines, considered by other Pain Management Practitioners as being common knowledge and practice, at the time of the intervention. For Medico-Legal purposes, it is also important to point out that variation in procedural techniques and pharmacological choices are the acceptable norm. The indications, contraindications, technique, and results of the above procedure should only be interpreted and judged by a Board-Certified Interventional Pain Specialist with extensive familiarity and expertise in the same exact procedure and technique.

## 2017-05-28 ENCOUNTER — Telehealth: Payer: Self-pay

## 2017-05-28 NOTE — Telephone Encounter (Signed)
Post procedure phone call.  Left message.  

## 2017-05-30 ENCOUNTER — Other Ambulatory Visit: Payer: Self-pay | Admitting: Pain Medicine

## 2017-05-30 DIAGNOSIS — G8929 Other chronic pain: Secondary | ICD-10-CM

## 2017-05-30 DIAGNOSIS — M7918 Myalgia, other site: Secondary | ICD-10-CM

## 2017-06-03 NOTE — Progress Notes (Signed)
Called and LM for Cristal to call me back

## 2017-06-04 ENCOUNTER — Telehealth: Payer: Self-pay

## 2017-06-04 ENCOUNTER — Ambulatory Visit
Admission: RE | Admit: 2017-06-04 | Discharge: 2017-06-04 | Disposition: A | Discharge status: Home / Self Care | Payer: Medicare Other | Source: Ambulatory Visit | Attending: Neurology | Admitting: Neurology

## 2017-06-04 DIAGNOSIS — G43709 Chronic migraine without aura, not intractable, without status migrainosus: Secondary | ICD-10-CM

## 2017-06-04 DIAGNOSIS — G43109 Migraine with aura, not intractable, without status migrainosus: Secondary | ICD-10-CM

## 2017-06-04 DIAGNOSIS — F445 Conversion disorder with seizures or convulsions: Secondary | ICD-10-CM

## 2017-06-04 DIAGNOSIS — R262 Difficulty in walking, not elsewhere classified: Secondary | ICD-10-CM | POA: Diagnosis not present

## 2017-06-04 DIAGNOSIS — G35 Multiple sclerosis: Secondary | ICD-10-CM

## 2017-06-04 MED ORDER — GADOBENATE DIMEGLUMINE 529 MG/ML IV SOLN
20.0000 mL | Freq: Once | INTRAVENOUS | Status: AC | PRN
  Administered 2017-06-04: 20 mL via INTRAVENOUS

## 2017-06-04 NOTE — Telephone Encounter (Signed)
Received VM from pt asking about anti anxiety for MRI today.  Rx called into pharmacy.  Valium 5mg  / #2 / No refills.

## 2017-06-05 ENCOUNTER — Telehealth: Payer: Self-pay | Admitting: Neurology

## 2017-06-05 NOTE — Telephone Encounter (Signed)
Patient called and wanted to let you know that her Botox was delivered to her house instead of the office. She will bring it with her to her appointment. Thanks

## 2017-06-06 ENCOUNTER — Ambulatory Visit (INDEPENDENT_AMBULATORY_CARE_PROVIDER_SITE_OTHER): Payer: Medicare Other | Admitting: Neurology

## 2017-06-06 DIAGNOSIS — G43709 Chronic migraine without aura, not intractable, without status migrainosus: Secondary | ICD-10-CM

## 2017-06-06 MED ORDER — ONABOTULINUMTOXINA 100 UNITS IJ SOLR
200.0000 [IU] | Freq: Once | INTRAMUSCULAR | Status: AC
  Administered 2017-06-06: 200 [IU] via INTRAMUSCULAR

## 2017-06-06 NOTE — Progress Notes (Signed)
[facility]   Procedure Note Botox  Attending: Dr. Gale Klar  Preoperative Diagnosis(es): Chronic migraine  Consent obtained from: The patient Benefits discussed included, but were not limited to decreased muscle tightness, increased joint range of motion, and decreased pain.  Risk discussed included, but were not limited pain and discomfort, bleeding, bruising, excessive weakness, venous thrombosis, muscle atrophy and dysphagia.  Anticipated outcomes of the procedure as well as he risks and benefits of the alternatives to the procedure, and the roles and tasks of the personnel to be involved, were discussed with the patient, and the patient consents to the procedure and agrees to proceed. A copy of the patient medication guide was given to the patient which explains the blackbox warning.  Patients identity and treatment sites confirmed Yes.  .  Details of Procedure: Skin was cleaned with alcohol. Prior to injection, the needle plunger was aspirated to make sure the needle was not within a blood vessel.  There was no blood retrieved on aspiration.    Following is a summary of the muscles injected  And the amount of Botulinum toxin used:  Dilution 200 units of Botox was reconstituted with 4 ml of preservative free normal saline. Time of reconstitution: At the time of the office visit (<30 minutes prior to injection)   Injections  155 total units of Botox was injected with a 30 gauge needle.  Injection Sites: L occipitalis: 15 units- 3 sites  R occiptalis: 15 units- 3 sites  L upper trapezius: 15 units- 3 sites R upper trapezius: 15 units- 3 sits          L paraspinal: 10 units- 2 sites R paraspinal: 10 units- 2 sites  Face L frontalis(2 injection sites):10 units   R frontalis(2 injection sites):10 units         L corrugator: 5 units   R corrugator: 5 units           Procerus: 5 units   L temporalis: 20 units R temporalis: 20 units   Agent:  200 units of botulinum Type A  (Onobotulinum Toxin type A) was reconstituted with 4 ml of preservative free normal saline.  Time of reconstitution: At the time of the office visit (<30 minutes prior to injection)     Total injected (Units): 155  Total wasted (Units): None wasted  Patient tolerated procedure well without complications.   Reinjection is anticipated in 3 months.    

## 2017-06-09 DIAGNOSIS — I10 Essential (primary) hypertension: Secondary | ICD-10-CM | POA: Diagnosis not present

## 2017-06-09 DIAGNOSIS — Z6841 Body Mass Index (BMI) 40.0 and over, adult: Secondary | ICD-10-CM | POA: Diagnosis not present

## 2017-06-09 DIAGNOSIS — M50122 Cervical disc disorder at C5-C6 level with radiculopathy: Secondary | ICD-10-CM | POA: Diagnosis not present

## 2017-06-09 DIAGNOSIS — M50121 Cervical disc disorder at C4-C5 level with radiculopathy: Secondary | ICD-10-CM | POA: Diagnosis not present

## 2017-06-16 ENCOUNTER — Telehealth: Payer: Self-pay | Admitting: Nurse Practitioner

## 2017-06-16 NOTE — Telephone Encounter (Signed)
Message left, instructing that we can do script for Methocarbamol at appt on 06-18-17.

## 2017-06-16 NOTE — Telephone Encounter (Signed)
Patient needs script for methacarbamal called in ? She has post procedcure

## 2017-06-17 DIAGNOSIS — F419 Anxiety disorder, unspecified: Secondary | ICD-10-CM | POA: Diagnosis not present

## 2017-06-17 DIAGNOSIS — I10 Essential (primary) hypertension: Secondary | ICD-10-CM | POA: Diagnosis not present

## 2017-06-17 DIAGNOSIS — Z6841 Body Mass Index (BMI) 40.0 and over, adult: Secondary | ICD-10-CM | POA: Diagnosis not present

## 2017-06-17 DIAGNOSIS — Z299 Encounter for prophylactic measures, unspecified: Secondary | ICD-10-CM | POA: Diagnosis not present

## 2017-06-17 NOTE — Progress Notes (Deleted)
Patient's Name: Emily [name]  MRN: [id number]  Referring Provider: Monico Blitz, MD  DOB: [birth year]  PCP: Monico Blitz, MD  DOS: 06/18/2017  Note by: Gaspar Cola, MD  Service setting: Ambulatory outpatient  Specialty: Interventional Pain Management  Location: [facility] (AMB) Pain Management Facility    Patient type: Established   Primary Reason(s) for Visit: Encounter for post-procedure evaluation of chronic illness with mild to moderate exacerbation CC: No chief complaint on file.  HPI  Emily [name] is a 37 y.o. year old, female patient, who comes today for a post-procedure evaluation. She has Depression; Multiple sclerosis ([place]); Migraine; Seizure disorder ([place]); Smoker; Obesity; HTN (hypertension); Perforated bowel ([place]); Conversion disorder with attacks or seizures, persistent, with psychological stressor; Migraine with aura and without status migrainosus, not intractable; Generalized anxiety disorder; Right optic neuritis; Difficult intravenous access; PTSD (post-traumatic stress disorder); Conversion disorder with seizures or convulsions; Severe episode of recurrent major depressive disorder, without psychotic features ([place]); GAD (generalized anxiety disorder); Postpartum endometritis; Paresthesia; Chronic pain syndrome; Substance abuse ([place]); Carpal tunnel syndrome (Tertiary Area of Pain) (Bilateral) (L>R); Incisional hernia; Incarcerated incisional hernia; DDD (degenerative disc disease), cervical; Laryngopharyngeal reflux; Tonsillitis; Opiate use; Chronic neck pain (Primary Area of Pain) (Bilateral) (R>L); Chronic low back pain (Secondary Area of Pain) (Bilateral) (R>L); Chronic knee pain (Fifth Area of Pain) (Left); Midline thoracic back pain; Chronic lower extremity pain (Bilateral); Major depressive disorder, single episode; Vitamin D deficiency; Chronic upper back pain (Primary Area of Pain) (Bilateral) (R>L); Chronic hand pain (Tertiary Area of Pain) (Bilateral) (L>R); Chronic wrist pain  (Tertiary Area of Pain) (Bilateral) (L>R); Chronic foot/toes pain (Fourth Area of Pain) (Bilateral) (R>L); Chronic upper extremity pain (Secondary Area of Pain) (Bilateral) (L>R); Long term prescription benzodiazepine use; 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); Foraminal stenosis of cervical region (B) (C6-7); Chronic CNS demyelinating disease (MS); DDD (degenerative disc disease), thoracic; DDD (degenerative disc disease), lumbar; Lumbar facet arthropathy; Lumbar facet syndrome (Bilateral) (R>L); Cervical radiculitis (Bilateral); Chronic musculoskeletal pain; Neurogenic pain; Cervicogenic headache; Occipital headache; Trigger point with back pain (Left); History of fainting (vasovagal); History of vasovagal syncope; Acute left-sided low back pain without sciatica; Spondylosis without myelopathy or radiculopathy, lumbosacral region; Cervical facet hypertrophy; Cervical facet syndrome (Bilateral) (R>L); and Spondylosis without myelopathy or radiculopathy, cervical region on their problem list. Her primarily concern today is the No chief complaint on file.  Pain Assessment: Location:     Radiating:   Onset:   Duration:   Quality:   Severity:  /10 (subjective, self-reported pain score)  Note: Reported level is compatible with observation.                         When using our objective Pain Scale, levels between 6 and 10/10 are said to belong in an emergency room, as it progressively worsens from a 6/10, described as severely limiting, requiring emergency care not usually available at an outpatient pain management facility. At a 6/10 level, communication becomes difficult and requires great effort. Assistance to reach the emergency department may be required. Facial flushing and profuse sweating along with potentially dangerous increases in heart rate and blood pressure will be evident. Effect on ADL:   Timing:   Modifying factors:   BP:    HR:    Emily [name] comes in today for post-procedure  evaluation after the treatment done on 06/16/2017.  Further details on both, my assessment(s), as well as the proposed treatment plan, please see below.  Post-Procedure Assessment  05/30/2017 Procedure: *** Pre-procedure pain score:        /10 Post-procedure pain score: 0/10         Influential Factors: BMI:   Intra-procedural challenges: None observed.         Assessment challenges: None detected.              Reported side-effects: None.        Post-procedural adverse reactions or complications: None reported         Sedation: Please see nurses note. When no sedatives are used, the analgesic levels obtained are directly associated to the effectiveness of the local anesthetics. However, when sedation is provided, the level of analgesia obtained during the initial 1 hour following the intervention, is believed to be the result of a combination of factors. These factors may include, but are not limited to: 1. The effectiveness of the local anesthetics used. 2. The effects of the analgesic(s) and/or anxiolytic(s) used. 3. The degree of discomfort experienced by the patient at the time of the procedure. 4. The patients ability and reliability in recalling and recording the events. 5. The presence and influence of possible secondary gains and/or psychosocial factors. Reported result: Relief experienced during the 1st hour after the procedure:   (Ultra-Short Term Relief)            Interpretative annotation: Clinically appropriate result. Analgesia during this period is likely to be Local Anesthetic and/or IV Sedative (Analgesic/Anxiolytic) related.          Effects of local anesthetic: The analgesic effects attained during this period are directly associated to the localized infiltration of local anesthetics and therefore cary significant diagnostic value as to the etiological location, or anatomical origin, of the pain. Expected duration of relief is directly dependent on the pharmacodynamics of  the local anesthetic used. Long-acting (4-6 hours) anesthetics used.  Reported result: Relief during the next 4 to 6 hour after the procedure:   (Short-Term Relief)            Interpretative annotation: Clinically appropriate result. Analgesia during this period is likely to be Local Anesthetic-related.          Long-term benefit: Defined as the period of time past the expected duration of local anesthetics (1 hour for short-acting and 4-6 hours for long-acting). With the possible exception of prolonged sympathetic blockade from the local anesthetics, benefits during this period are typically attributed to, or associated with, other factors such as analgesic sensory neuropraxia, antiinflammatory effects, or beneficial biochemical changes provided by agents other than the local anesthetics.  Reported result: Extended relief following procedure:   (Long-Term Relief)            Interpretative annotation: Clinically appropriate result. Good relief. No permanent benefit expected. Inflammation plays a part in the etiology to the pain.          Current benefits: Defined as reported results that persistent at this point in time.   Analgesia: *** %            Function: Somewhat improved ROM: Somewhat improved Interpretative annotation: Recurrence of symptoms. No permanent benefit expected. Effective diagnostic intervention.          Interpretation: Results would suggest a successful diagnostic intervention.                  Plan:  Please see "Plan of Care" for details.                Laboratory  Chemistry  Inflammation Markers (CRP: Acute Phase) (ESR: Chronic Phase) Lab Results  Component Value Date   CRP 2.0 07/23/2016   ESRSEDRATE 14 07/23/2016   LATICACIDVEN 0.9 02/15/2016                         Renal Markers Lab Results  Component Value Date   BUN 10 03/03/2017   CREATININE 0.93 03/03/2017   BCR 14 07/23/2016   GFRAA >60 03/03/2017   GFRNONAA >60 03/03/2017                              Hepatic Markers Lab Results  Component Value Date   AST 12 07/23/2016   ALT 10 07/23/2016   ALBUMIN 4.0 07/23/2016                        Neuropathy Markers Lab Results  Component Value Date   HIV NONREACTIVE 05/22/2015                        Hematology Parameters Lab Results  Component Value Date   PLT 335 03/03/2017   HGB 15.6 (H) 03/03/2017   HCT 45.6 03/03/2017                        CV Markers Lab Results  Component Value Date   TROPONINI <0.03 05/23/2014                         Note: Lab results reviewed.  Recent Diagnostic Imaging Results  MR BRAIN W WO CONTRAST CLINICAL DATA:  Difficulty ambulating, vision changes predominately RIGHT eye. History of multiple sclerosis, seizures, migraines.  EXAM: MRI HEAD WITHOUT AND WITH CONTRAST  TECHNIQUE: Multiplanar, multiecho pulse sequences of the brain and surrounding structures were obtained without and with intravenous contrast.  CONTRAST:  86m MULTIHANCE GADOBENATE DIMEGLUMINE 529 MG/ML IV SOLN  COMPARISON:  CT HEAD March 12, 2016 and MRI of the head July 30, 2015  FINDINGS: INTRACRANIAL CONTENTS: No reduced diffusion to suggest acute ischemia or hyperacute demyelination. No susceptibility artifact to suggest hemorrhage. The ventricles and sulci are normal for patient's age. Greater than 10 subcentimeter supratentorial white matter FLAIR T2 hyperintensities some of which radiate from the periventricular margin. No midline shift, mass effect or masses. No abnormal intraparenchymal or extra-axial enhancement. No abnormal extra-axial fluid collections. No extra-axial masses.  VASCULAR: Normal major intracranial vascular flow voids present at skull base.  SKULL AND UPPER CERVICAL SPINE: No abnormal sellar expansion. No suspicious calvarial bone marrow signal. Craniocervical junction maintained.  SINUSES/ORBITS: RIGHT maxillary mucosal retention cyst. No paranasal sinus air-fluid levels. Mastoid air  cells are well aerated.The included ocular globes and orbital contents are non-suspicious.  OTHER: None.  IMPRESSION: 1. Greater than 10 supratentorial subcentimeter white matter lesions, including new lesions. Some lesions have appearance of demyelination though there may be a component chronic small vessel ischemic changes. No abnormal enhancement.  Electronically Signed   By: CElon AlasM.D.   On: 06/04/2017 19:58  Complexity Note: I personally reviewed the fluoroscopic imaging of the procedure.                        Meds   Current Outpatient Medications:  .  albuterol (PROVENTIL HFA;VENTOLIN HFA) 108 (90 Base) MCG/ACT inhaler, Inhale  2 puffs into the lungs every 6 (six) hours as needed for wheezing or shortness of breath. (Patient taking differently: Inhale 2 puffs into the lungs every 4 (four) hours as needed for wheezing or shortness of breath. ), Disp: 1 Inhaler, Rfl: 0 .  amLODipine (NORVASC) 5 MG tablet, Take 5 mg by mouth every evening., Disp: , Rfl: 2 .  busPIRone (BUSPAR) 15 MG tablet, Take 1 tablet (15 mg total) by mouth 2 (two) times daily., Disp: 60 tablet, Rfl: 3 .  Calcium Carb-Cholecalciferol (CALCIUM PLUS D3 ABSORBABLE) [phone] MG-UNIT CAPS, Take 1 capsule by mouth daily with breakfast., Disp: 90 capsule, Rfl: 0 .  diazepam (VALIUM) 5 MG tablet, Take 1 tablet (5 mg total) by mouth daily. Take 1 tablet as needed for panic attacks leading to seizures., Disp: 30 tablet, Rfl: 3 .  diphenhydrAMINE (SOMINEX) 25 MG tablet, Take 2 tablets (50 mg total) by mouth at bedtime as needed for sleep., Disp: 60 tablet, Rfl: 3 .  divalproex (DEPAKOTE) 500 MG DR tablet, Take 1 tablet (500 mg total) by mouth at bedtime., Disp: 90 tablet, Rfl: 3 .  EPINEPHrine (EPIPEN) 0.3 mg/0.3 mL SOAJ injection, Inject 0.3 mg into the muscle as needed (allergic reaction). Reported on 03/28/2015, Disp: , Rfl:  .  etonogestrel (NEXPLANON) 68 MG IMPL implant, 1 each by Subdermal route once., Disp: ,  Rfl:  .  Fingolimod HCl (GILENYA) 0.5 MG CAPS, Take 1 capsule (0.5 mg total) by mouth daily., Disp: 30 capsule, Rfl: 10 .  FLUoxetine (PROZAC) 40 MG capsule, Take 2 capsules (80 mg total) by mouth daily., Disp: 60 capsule, Rfl: 3 .  hydrOXYzine (VISTARIL) 25 MG capsule, Take 1 capsule (25 mg total) by mouth 2 (two) times daily., Disp: 60 capsule, Rfl: 2 .  ibuprofen (ADVIL,MOTRIN) 600 MG tablet, Take 600 mg by mouth daily as needed for moderate pain., Disp: , Rfl:  .  loratadine (CLARITIN) 10 MG tablet, Take 10 mg by mouth daily as needed for allergies., Disp: , Rfl:  .  methocarbamol (ROBAXIN) 750 MG tablet, Take 1 tablet (750 mg total) by mouth 2 (two) times daily., Disp: 180 tablet, Rfl: 0 .  metoCLOPramide (REGLAN) 10 MG tablet, Take as needed, Disp: 20 tablet, Rfl: 6 .  Multiple Vitamins-Minerals (MULTIVITAMIN ADULT PO), Take 1 tablet by mouth daily. , Disp: , Rfl:  .  OnabotulinumtoxinA (BOTOX IJ), Inject as directed every 3 (three) months., Disp: , Rfl:  .  phentermine 37.5 MG capsule, Take 37.5 mg by mouth every morning., Disp: , Rfl:  .  pregabalin (LYRICA) 100 MG capsule, Take 1 capsule (100 mg total) by mouth 3 (three) times daily., Disp: 90 capsule, Rfl: 0 .  ranitidine (ZANTAC) 300 MG tablet, Take 300 mg by mouth at bedtime. , Disp: , Rfl:  .  topiramate (TOPAMAX) 100 MG tablet, Take 1.5 tablets twice a day, Disp: 270 tablet, Rfl: 3 .  zolmitriptan (ZOMIG) 5 MG nasal solution, Use nasal spray as prescribed. Do not use more than 3 times a week, Disp: 6 Units, Rfl: 5  Current Facility-Administered Medications:  .  metoCLOPramide (REGLAN) 10 mg in dextrose 5 % 50 mL IVPB, 10 mg, Intravenous, Once, Delice Lesch, Lezlie Octave, MD  Facility-Administered Medications Ordered in Other Visits:  .  metoCLOPramide (REGLAN) injection 10 mg, 10 mg, Intravenous, Once, Milton Ferguson, MD  ROS  Constitutional: Denies any fever or chills Gastrointestinal: No reported hemesis, hematochezia, vomiting, or acute  GI distress Musculoskeletal: Denies any acute onset joint swelling, redness,  loss of ROM, or weakness Neurological: No reported episodes of acute onset apraxia, aphasia, dysarthria, agnosia, amnesia, paralysis, loss of coordination, or loss of consciousness  Allergies  Ms. Corso is allergic to amitriptyline; baclofen; duloxetine hcl; gabapentin; hydrocodone-acetaminophen; magnesium salicylate; monosodium glutamate; other; tizanidine; alprazolam; rizatriptan; vicodin [hydrocodone-acetaminophen]; adhesive [tape]; ketorolac tromethamine; lamotrigine; and tramadol.  PFSH  Drug: Ms. Mcnicholas  reports that she does not use drugs. Alcohol:  reports that she does not drink alcohol. Tobacco:  reports that she quit smoking about 23 months ago. Her smoking use included cigarettes. She has a 2.50 pack-year smoking history. She has never used smokeless tobacco. Medical:  has a past medical history of Anxiety, Asthma (as a child), Chronic back pain, Chronic pain, Conversion disorder, Depression, GERD (gastroesophageal reflux disease), Headache(784.0), HTN (hypertension) (02/27/2015), JC virus antibody positive, Migraines, MS (multiple sclerosis) ([place]), Multiple sclerosis ([place]), Ovarian cyst, Panic attack, Perforated bowel ([place]) (2009), Pseudoseizures, PTSD (post-traumatic stress disorder), S/P emergency C-section, and Urinary urgency. Surgical: Ms. 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 Family: family history includes Arthritis in her father; Cancer in her maternal grandfather and maternal grandmother; Diabetes in her father  and mother; Hypertension in her father and mother.  Constitutional Exam  General appearance: Well nourished, well developed, and well hydrated. In no apparent acute distress There were no vitals filed for this visit. BMI Assessment: Estimated body mass index is 44.46 kg/m as calculated from the following:   Height as of 05/27/17: '5\' 4"'$  (1.626 m).   Weight as of 05/27/17: 259 lb (117.5 kg).  BMI interpretation table: BMI level Category Range association with higher incidence of chronic pain  <18 kg/m2 Underweight   18.5-24.9 kg/m2 Ideal body weight   25-29.9 kg/m2 Overweight Increased incidence by 20%  30-34.9 kg/m2 Obese (Class I) Increased incidence by 68%  35-39.9 kg/m2 Severe obesity (Class II) Increased incidence by 136%  >40 kg/m2 Extreme obesity (Class III) Increased incidence by 254%   Patient's current BMI Ideal Body weight  There is no height or weight on file to calculate BMI. Patient weight not recorded   BMI Readings from Last 4 Encounters:  05/27/17 44.46 kg/m  05/12/17 45.04 kg/m  04/30/17 44.63 kg/m  04/21/17 46.94 kg/m   Wt Readings from Last 4 Encounters:  05/27/17 259 lb (117.5 kg)  05/12/17 262 lb 6 oz (119 kg)  04/30/17 260 lb (117.9 kg)  04/21/17 265 lb (120.2 kg)  Psych/Mental status: Alert, oriented x 3 (person, place, & time)       Eyes: PERLA Respiratory: No evidence of acute respiratory distress  Cervical Spine Area Exam  Skin & Axial Inspection: No masses, redness, edema, swelling, or associated skin lesions Alignment: Symmetrical Functional ROM: Unrestricted ROM      Stability: No instability detected Muscle Tone/Strength: Functionally intact. No obvious neuro-muscular anomalies detected. Sensory (Neurological): Unimpaired Palpation: No palpable anomalies              Upper Extremity (UE) Exam    Side: Right upper extremity  Side: Left upper extremity  Skin & Extremity Inspection: Skin color, temperature, and hair growth are WNL. No  peripheral edema or cyanosis. No masses, redness, swelling, asymmetry, or associated skin lesions. No contractures.  Skin &  Extremity Inspection: Skin color, temperature, and hair growth are WNL. No peripheral edema or cyanosis. No masses, redness, swelling, asymmetry, or associated skin lesions. No contractures.  Functional ROM: Unrestricted ROM          Functional ROM: Unrestricted ROM          Muscle Tone/Strength: Functionally intact. No obvious neuro-muscular anomalies detected.  Muscle Tone/Strength: Functionally intact. No obvious neuro-muscular anomalies detected.  Sensory (Neurological): Unimpaired          Sensory (Neurological): Unimpaired          Palpation: No palpable anomalies              Palpation: No palpable anomalies              Provocative Test(s):  Phalen's test: deferred Tinel's test: deferred Apley's scratch test (touch opposite shoulder):  Action 1 (Across chest): deferred Action 2 (Overhead): deferred Action 3 (LB reach): deferred   Provocative Test(s):  Phalen's test: deferred Tinel's test: deferred Apley's scratch test (touch opposite shoulder):  Action 1 (Across chest): deferred Action 2 (Overhead): deferred Action 3 (LB reach): deferred    Thoracic Spine Area Exam  Skin & Axial Inspection: No masses, redness, or swelling Alignment: Symmetrical Functional ROM: Unrestricted ROM Stability: No instability detected Muscle Tone/Strength: Functionally intact. No obvious neuro-muscular anomalies detected. Sensory (Neurological): Unimpaired Muscle strength & Tone: No palpable anomalies  Lumbar Spine Area Exam  Skin & Axial Inspection: No masses, redness, or swelling Alignment: Symmetrical Functional ROM: Unrestricted ROM       Stability: No instability detected Muscle Tone/Strength: Functionally intact. No obvious neuro-muscular anomalies detected. Sensory (Neurological): Unimpaired Palpation: No palpable anomalies       Provocative Tests: Lumbar  Hyperextension/rotation test: deferred today       Lumbar quadrant test (Kemp's test): deferred today       Lumbar Lateral bending test: deferred today       Patrick's Maneuver: deferred today                   FABER test: deferred today       Thigh-thrust test: deferred today       S-I compression test: deferred today       S-I distraction test: deferred today        Gait & Posture Assessment  Ambulation: Unassisted Gait: Relatively normal for age and body habitus Posture: WNL   Lower Extremity Exam    Side: Right lower extremity  Side: Left lower extremity  Stability: No instability observed          Stability: No instability observed          Skin & Extremity Inspection: Skin color, temperature, and hair growth are WNL. No peripheral edema or cyanosis. No masses, redness, swelling, asymmetry, or associated skin lesions. No contractures.  Skin & Extremity Inspection: Skin color, temperature, and hair growth are WNL. No peripheral edema or cyanosis. No masses, redness, swelling, asymmetry, or associated skin lesions. No contractures.  Functional ROM: Unrestricted ROM                  Functional ROM: Unrestricted ROM                  Muscle Tone/Strength: Functionally intact. No obvious neuro-muscular anomalies detected.  Muscle Tone/Strength: Functionally intact. No obvious neuro-muscular anomalies detected.  Sensory (Neurological): Unimpaired  Sensory (Neurological): Unimpaired  Palpation: No palpable anomalies  Palpation: No palpable anomalies   Assessment  Primary Diagnosis &  Pertinent Problem List: There were no encounter diagnoses.  Status Diagnosis  Controlled Controlled Controlled No diagnosis found.  Problems updated and reviewed during this visit: No problems updated. Plan of Care  Pharmacotherapy (Medications Ordered): No orders of the defined types were placed in this encounter.  Medications administered today: Odesser L. Liotta had no medications administered  during this visit.  Procedure Orders    No procedure(s) ordered today   Lab Orders  No laboratory test(s) ordered today   Imaging Orders  No imaging studies ordered today   Referral Orders  No referral(s) requested today    Interventional management options: Planned, scheduled, and/or pending:   ***   Considering:   ***   Palliative PRN treatment(s):   None at this time   Provider-requested follow-up: No follow-ups on file.  Future Appointments  Date Time Provider [place]  06/18/2017 11:15 AM Milinda Pointer, MD [facility] None  08/21/2017  1:30 PM Charlcie Cradle, MD [facility] None  09/05/2017 10:50 AM Pieter Partridge, DO [facility] None  12/02/2017 10:00 AM Cameron Sprang, MD [facility] None   Primary Care Physician: Monico Blitz, MD Location: [facility] Out[facility] Management Facility Note by: Gaspar Cola, MD Date: 06/18/2017; Time: 5:37 PM

## 2017-06-18 ENCOUNTER — Ambulatory Visit: Payer: Self-pay | Admitting: Pain Medicine

## 2017-06-19 ENCOUNTER — Telehealth: Payer: Self-pay | Admitting: *Deleted

## 2017-06-19 NOTE — Telephone Encounter (Signed)
Needs Methocarbamol. Cancelled appointment on 06-17-17 due to illness. Is rescheduling the appointment.

## 2017-06-19 NOTE — Telephone Encounter (Signed)
She sees Dr Lowella Dandy and he does not want this to be done over the phone. We have done this once and I will not continue this per his instructions.

## 2017-06-23 NOTE — Telephone Encounter (Signed)
Patient notified of this.

## 2017-06-24 NOTE — Progress Notes (Deleted)
Patient's Name: ADRINNE [name]  MRN: [id number]  Referring Provider: Monico Blitz, MD  DOB: [birth year]  PCP: Monico Blitz, MD  DOS: 06/25/2017  Note by: Gaspar Cola, MD  Service setting: Ambulatory outpatient  Specialty: Interventional Pain Management  Location: [facility] (AMB) Pain Management Facility    Patient type: Established   Primary Reason(s) for Visit: Encounter for post-procedure evaluation of chronic illness with mild to moderate exacerbation CC: No chief complaint on file.  HPI  Emily [name] is a 37 y.o. year old, female patient, who comes today for a post-procedure evaluation. She has Depression; Multiple sclerosis ([place]); Migraine; Seizure disorder ([place]); Smoker; Obesity; HTN (hypertension); Perforated bowel ([place]); Conversion disorder with attacks or seizures, persistent, with psychological stressor; Migraine with aura and without status migrainosus, not intractable; Generalized anxiety disorder; Right optic neuritis; Difficult intravenous access; PTSD (post-traumatic stress disorder); Conversion disorder with seizures or convulsions; Severe episode of recurrent major depressive disorder, without psychotic features ([place]); GAD (generalized anxiety disorder); Postpartum endometritis; Paresthesia; Chronic pain syndrome; Substance abuse ([place]); Carpal tunnel syndrome (Tertiary Area of Pain) (Bilateral) (L>R); Incisional hernia; Incarcerated incisional hernia; DDD (degenerative disc disease), cervical; Laryngopharyngeal reflux; Tonsillitis; Opiate use; Chronic neck pain (Primary Area of Pain) (Bilateral) (R>L); Chronic low back pain (Secondary Area of Pain) (Bilateral) (R>L); Chronic knee pain (Fifth Area of Pain) (Left); Midline thoracic back pain; Chronic lower extremity pain (Bilateral); Major depressive disorder, single episode; Vitamin D deficiency; Chronic upper back pain (Primary Area of Pain) (Bilateral) (R>L); Chronic hand pain (Tertiary Area of Pain) (Bilateral) (L>R); Chronic wrist pain  (Tertiary Area of Pain) (Bilateral) (L>R); Chronic foot/toes pain (Fourth Area of Pain) (Bilateral) (R>L); Chronic upper extremity pain (Secondary Area of Pain) (Bilateral) (L>R); Long term prescription benzodiazepine use; 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); Foraminal stenosis of cervical region (B) (C6-7); Chronic CNS demyelinating disease (MS); DDD (degenerative disc disease), thoracic; DDD (degenerative disc disease), lumbar; Lumbar facet arthropathy; Lumbar facet syndrome (Bilateral) (R>L); Cervical radiculitis (Bilateral); Chronic musculoskeletal pain; Neurogenic pain; Cervicogenic headache; Occipital headache; Trigger point with back pain (Left); History of fainting (vasovagal); History of vasovagal syncope; Acute left-sided low back pain without sciatica; Spondylosis without myelopathy or radiculopathy, lumbosacral region; Cervical facet hypertrophy; Cervical facet syndrome (Bilateral) (R>L); and Spondylosis without myelopathy or radiculopathy, cervical region on their problem list. Her primarily concern today is the No chief complaint on file.  Pain Assessment: Location:     Radiating:   Onset:   Duration:   Quality:   Severity:  /10 (subjective, self-reported pain score)  Note: Reported level is compatible with observation.                         When using our objective Pain Scale, levels between 6 and 10/10 are said to belong in an emergency room, as it progressively worsens from a 6/10, described as severely limiting, requiring emergency care not usually available at an outpatient pain management facility. At a 6/10 level, communication becomes difficult and requires great effort. Assistance to reach the emergency department may be required. Facial flushing and profuse sweating along with potentially dangerous increases in heart rate and blood pressure will be evident. Effect on ADL:   Timing:   Modifying factors:   BP:    HR:    Emily [name] comes in today for post-procedure  evaluation after the treatment done on 06/19/2017.  Further details on both, my assessment(s), as well as the proposed treatment plan, please see below.  Post-Procedure Assessment  05/30/2017 Procedure: *** Pre-procedure pain score:        /10 Post-procedure pain score: 0/10         Influential Factors: BMI:   Intra-procedural challenges: None observed.         Assessment challenges: None detected.              Reported side-effects: None.        Post-procedural adverse reactions or complications: None reported         Sedation: Please see nurses note. When no sedatives are used, the analgesic levels obtained are directly associated to the effectiveness of the local anesthetics. However, when sedation is provided, the level of analgesia obtained during the initial 1 hour following the intervention, is believed to be the result of a combination of factors. These factors may include, but are not limited to: 1. The effectiveness of the local anesthetics used. 2. The effects of the analgesic(s) and/or anxiolytic(s) used. 3. The degree of discomfort experienced by the patient at the time of the procedure. 4. The patients ability and reliability in recalling and recording the events. 5. The presence and influence of possible secondary gains and/or psychosocial factors. Reported result: Relief experienced during the 1st hour after the procedure:   (Ultra-Short Term Relief)            Interpretative annotation: Clinically appropriate result. Analgesia during this period is likely to be Local Anesthetic and/or IV Sedative (Analgesic/Anxiolytic) related.          Effects of local anesthetic: The analgesic effects attained during this period are directly associated to the localized infiltration of local anesthetics and therefore cary significant diagnostic value as to the etiological location, or anatomical origin, of the pain. Expected duration of relief is directly dependent on the pharmacodynamics of  the local anesthetic used. Long-acting (4-6 hours) anesthetics used.  Reported result: Relief during the next 4 to 6 hour after the procedure:   (Short-Term Relief)            Interpretative annotation: Clinically appropriate result. Analgesia during this period is likely to be Local Anesthetic-related.          Long-term benefit: Defined as the period of time past the expected duration of local anesthetics (1 hour for short-acting and 4-6 hours for long-acting). With the possible exception of prolonged sympathetic blockade from the local anesthetics, benefits during this period are typically attributed to, or associated with, other factors such as analgesic sensory neuropraxia, antiinflammatory effects, or beneficial biochemical changes provided by agents other than the local anesthetics.  Reported result: Extended relief following procedure:   (Long-Term Relief)            Interpretative annotation: Clinically appropriate result. Good relief. No permanent benefit expected. Inflammation plays a part in the etiology to the pain.          Current benefits: Defined as reported results that persistent at this point in time.   Analgesia: *** %            Function: Somewhat improved ROM: Somewhat improved Interpretative annotation: Recurrence of symptoms. No permanent benefit expected. Effective diagnostic intervention.          Interpretation: Results would suggest a successful diagnostic intervention.                  Plan:  Please see "Plan of Care" for details.                Laboratory  Chemistry  Inflammation Markers (CRP: Acute Phase) (ESR: Chronic Phase) Lab Results  Component Value Date   CRP 2.0 07/23/2016   ESRSEDRATE 14 07/23/2016   LATICACIDVEN 0.9 02/15/2016                         Renal Markers Lab Results  Component Value Date   BUN 10 03/03/2017   CREATININE 0.93 03/03/2017   BCR 14 07/23/2016   GFRAA >60 03/03/2017   GFRNONAA >60 03/03/2017                              Hepatic Markers Lab Results  Component Value Date   AST 12 07/23/2016   ALT 10 07/23/2016   ALBUMIN 4.0 07/23/2016                        Neuropathy Markers Lab Results  Component Value Date   HIV NONREACTIVE 05/22/2015                        Hematology Parameters Lab Results  Component Value Date   PLT 335 03/03/2017   HGB 15.6 (H) 03/03/2017   HCT 45.6 03/03/2017                        CV Markers Lab Results  Component Value Date   TROPONINI <0.03 05/23/2014                         Note: Lab results reviewed.  Recent Diagnostic Imaging Results  MR BRAIN W WO CONTRAST CLINICAL DATA:  Difficulty ambulating, vision changes predominately RIGHT eye. History of multiple sclerosis, seizures, migraines.  EXAM: MRI HEAD WITHOUT AND WITH CONTRAST  TECHNIQUE: Multiplanar, multiecho pulse sequences of the brain and surrounding structures were obtained without and with intravenous contrast.  CONTRAST:  78m MULTIHANCE GADOBENATE DIMEGLUMINE 529 MG/ML IV SOLN  COMPARISON:  CT HEAD March 12, 2016 and MRI of the head July 30, 2015  FINDINGS: INTRACRANIAL CONTENTS: No reduced diffusion to suggest acute ischemia or hyperacute demyelination. No susceptibility artifact to suggest hemorrhage. The ventricles and sulci are normal for patient's age. Greater than 10 subcentimeter supratentorial white matter FLAIR T2 hyperintensities some of which radiate from the periventricular margin. No midline shift, mass effect or masses. No abnormal intraparenchymal or extra-axial enhancement. No abnormal extra-axial fluid collections. No extra-axial masses.  VASCULAR: Normal major intracranial vascular flow voids present at skull base.  SKULL AND UPPER CERVICAL SPINE: No abnormal sellar expansion. No suspicious calvarial bone marrow signal. Craniocervical junction maintained.  SINUSES/ORBITS: RIGHT maxillary mucosal retention cyst. No paranasal sinus air-fluid levels. Mastoid air  cells are well aerated.The included ocular globes and orbital contents are non-suspicious.  OTHER: None.  IMPRESSION: 1. Greater than 10 supratentorial subcentimeter white matter lesions, including new lesions. Some lesions have appearance of demyelination though there may be a component chronic small vessel ischemic changes. No abnormal enhancement.  Electronically Signed   By: CElon AlasM.D.   On: 06/04/2017 19:58  Complexity Note: I personally reviewed the fluoroscopic imaging of the procedure.                        Meds   Current Outpatient Medications:  .  albuterol (PROVENTIL HFA;VENTOLIN HFA) 108 (90 Base) MCG/ACT inhaler, Inhale  2 puffs into the lungs every 6 (six) hours as needed for wheezing or shortness of breath. (Patient taking differently: Inhale 2 puffs into the lungs every 4 (four) hours as needed for wheezing or shortness of breath. ), Disp: 1 Inhaler, Rfl: 0 .  amLODipine (NORVASC) 5 MG tablet, Take 5 mg by mouth every evening., Disp: , Rfl: 2 .  busPIRone (BUSPAR) 15 MG tablet, Take 1 tablet (15 mg total) by mouth 2 (two) times daily., Disp: 60 tablet, Rfl: 3 .  Calcium Carb-Cholecalciferol (CALCIUM PLUS D3 ABSORBABLE) [phone] MG-UNIT CAPS, Take 1 capsule by mouth daily with breakfast., Disp: 90 capsule, Rfl: 0 .  diazepam (VALIUM) 5 MG tablet, Take 1 tablet (5 mg total) by mouth daily. Take 1 tablet as needed for panic attacks leading to seizures., Disp: 30 tablet, Rfl: 3 .  diphenhydrAMINE (SOMINEX) 25 MG tablet, Take 2 tablets (50 mg total) by mouth at bedtime as needed for sleep., Disp: 60 tablet, Rfl: 3 .  divalproex (DEPAKOTE) 500 MG DR tablet, Take 1 tablet (500 mg total) by mouth at bedtime., Disp: 90 tablet, Rfl: 3 .  EPINEPHrine (EPIPEN) 0.3 mg/0.3 mL SOAJ injection, Inject 0.3 mg into the muscle as needed (allergic reaction). Reported on 03/28/2015, Disp: , Rfl:  .  etonogestrel (NEXPLANON) 68 MG IMPL implant, 1 each by Subdermal route once., Disp: ,  Rfl:  .  Fingolimod HCl (GILENYA) 0.5 MG CAPS, Take 1 capsule (0.5 mg total) by mouth daily., Disp: 30 capsule, Rfl: 10 .  FLUoxetine (PROZAC) 40 MG capsule, Take 2 capsules (80 mg total) by mouth daily., Disp: 60 capsule, Rfl: 3 .  hydrOXYzine (VISTARIL) 25 MG capsule, Take 1 capsule (25 mg total) by mouth 2 (two) times daily., Disp: 60 capsule, Rfl: 2 .  ibuprofen (ADVIL,MOTRIN) 600 MG tablet, Take 600 mg by mouth daily as needed for moderate pain., Disp: , Rfl:  .  loratadine (CLARITIN) 10 MG tablet, Take 10 mg by mouth daily as needed for allergies., Disp: , Rfl:  .  methocarbamol (ROBAXIN) 750 MG tablet, Take 1 tablet (750 mg total) by mouth 2 (two) times daily., Disp: 180 tablet, Rfl: 0 .  metoCLOPramide (REGLAN) 10 MG tablet, Take as needed, Disp: 20 tablet, Rfl: 6 .  Multiple Vitamins-Minerals (MULTIVITAMIN ADULT PO), Take 1 tablet by mouth daily. , Disp: , Rfl:  .  OnabotulinumtoxinA (BOTOX IJ), Inject as directed every 3 (three) months., Disp: , Rfl:  .  phentermine 37.5 MG capsule, Take 37.5 mg by mouth every morning., Disp: , Rfl:  .  pregabalin (LYRICA) 100 MG capsule, Take 1 capsule (100 mg total) by mouth 3 (three) times daily., Disp: 90 capsule, Rfl: 0 .  ranitidine (ZANTAC) 300 MG tablet, Take 300 mg by mouth at bedtime. , Disp: , Rfl:  .  topiramate (TOPAMAX) 100 MG tablet, Take 1.5 tablets twice a day, Disp: 270 tablet, Rfl: 3 .  zolmitriptan (ZOMIG) 5 MG nasal solution, Use nasal spray as prescribed. Do not use more than 3 times a week, Disp: 6 Units, Rfl: 5  Current Facility-Administered Medications:  .  metoCLOPramide (REGLAN) 10 mg in dextrose 5 % 50 mL IVPB, 10 mg, Intravenous, Once, Delice Lesch, Lezlie Octave, MD  Facility-Administered Medications Ordered in Other Visits:  .  metoCLOPramide (REGLAN) injection 10 mg, 10 mg, Intravenous, Once, Milton Ferguson, MD  ROS  Constitutional: Denies any fever or chills Gastrointestinal: No reported hemesis, hematochezia, vomiting, or acute  GI distress Musculoskeletal: Denies any acute onset joint swelling, redness,  loss of ROM, or weakness Neurological: No reported episodes of acute onset apraxia, aphasia, dysarthria, agnosia, amnesia, paralysis, loss of coordination, or loss of consciousness  Allergies  Emily [name] is allergic to amitriptyline; baclofen; duloxetine hcl; gabapentin; hydrocodone-acetaminophen; magnesium salicylate; monosodium glutamate; other; tizanidine; alprazolam; rizatriptan; vicodin [hydrocodone-acetaminophen]; adhesive [tape]; ketorolac tromethamine; lamotrigine; and tramadol.  PFSH  Drug: Emily [name]  reports that she does not use drugs. Alcohol:  reports that she does not drink alcohol. Tobacco:  reports that she quit smoking about 1 years ago. Her smoking use included cigarettes. She has a 2.50 pack-year smoking history. She has never used smokeless tobacco. Medical:  has a past medical history of Anxiety, Asthma (as a child), Chronic back pain, Chronic pain, Conversion disorder, Depression, GERD (gastroesophageal reflux disease), Headache(784.0), HTN (hypertension) (02/27/2015), JC virus antibody positive, Migraines, MS (multiple sclerosis) ([place]), Multiple sclerosis ([place]), Ovarian cyst, Panic attack, Perforated bowel ([place]) (2009), Pseudoseizures, PTSD (post-traumatic stress disorder), S/P emergency C-section, and Urinary urgency. 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 Family: family history includes Arthritis in her father; Cancer in her maternal grandfather and maternal grandmother; Diabetes in her father  and mother; Hypertension in her father and mother.  Constitutional Exam  General appearance: Well nourished, well developed, and well hydrated. In no apparent acute distress There were no vitals filed for this visit. BMI Assessment: Estimated body mass index is 44.46 kg/m as calculated from the following:   Height as of 05/27/17: '5\' 4"'$  (1.626 m).   Weight as of 05/27/17: 259 lb (117.5 kg).  BMI interpretation table: BMI level Category Range association with higher incidence of chronic pain  <18 kg/m2 Underweight   18.5-24.9 kg/m2 Ideal body weight   25-29.9 kg/m2 Overweight Increased incidence by 20%  30-34.9 kg/m2 Obese (Class I) Increased incidence by 68%  35-39.9 kg/m2 Severe obesity (Class II) Increased incidence by 136%  >40 kg/m2 Extreme obesity (Class III) Increased incidence by 254%   Patient's current BMI Ideal Body weight  There is no height or weight on file to calculate BMI. Patient weight not recorded   BMI Readings from Last 4 Encounters:  05/27/17 44.46 kg/m  05/12/17 45.04 kg/m  04/30/17 44.63 kg/m  04/21/17 46.94 kg/m   Wt Readings from Last 4 Encounters:  05/27/17 259 lb (117.5 kg)  05/12/17 262 lb 6 oz (119 kg)  04/30/17 260 lb (117.9 kg)  04/21/17 265 lb (120.2 kg)  Psych/Mental status: Alert, oriented x 3 (person, place, & time)       Eyes: PERLA Respiratory: No evidence of acute respiratory distress  Cervical Spine Area Exam  Skin & Axial Inspection: No masses, redness, edema, swelling, or associated skin lesions Alignment: Symmetrical Functional ROM: Unrestricted ROM      Stability: No instability detected Muscle Tone/Strength: Functionally intact. No obvious neuro-muscular anomalies detected. Sensory (Neurological): Unimpaired Palpation: No palpable anomalies              Upper Extremity (UE) Exam    Side: Right upper extremity  Side: Left upper extremity  Skin & Extremity Inspection: Skin color, temperature, and hair growth are WNL. No  peripheral edema or cyanosis. No masses, redness, swelling, asymmetry, or associated skin lesions. No contractures.  Skin &  Extremity Inspection: Skin color, temperature, and hair growth are WNL. No peripheral edema or cyanosis. No masses, redness, swelling, asymmetry, or associated skin lesions. No contractures.  Functional ROM: Unrestricted ROM          Functional ROM: Unrestricted ROM          Muscle Tone/Strength: Functionally intact. No obvious neuro-muscular anomalies detected.  Muscle Tone/Strength: Functionally intact. No obvious neuro-muscular anomalies detected.  Sensory (Neurological): Unimpaired          Sensory (Neurological): Unimpaired          Palpation: No palpable anomalies              Palpation: No palpable anomalies              Provocative Test(s):  Phalen's test: deferred Tinel's test: deferred Apley's scratch test (touch opposite shoulder):  Action 1 (Across chest): deferred Action 2 (Overhead): deferred Action 3 (LB reach): deferred   Provocative Test(s):  Phalen's test: deferred Tinel's test: deferred Apley's scratch test (touch opposite shoulder):  Action 1 (Across chest): deferred Action 2 (Overhead): deferred Action 3 (LB reach): deferred    Thoracic Spine Area Exam  Skin & Axial Inspection: No masses, redness, or swelling Alignment: Symmetrical Functional ROM: Unrestricted ROM Stability: No instability detected Muscle Tone/Strength: Functionally intact. No obvious neuro-muscular anomalies detected. Sensory (Neurological): Unimpaired Muscle strength & Tone: No palpable anomalies  Lumbar Spine Area Exam  Skin & Axial Inspection: No masses, redness, or swelling Alignment: Symmetrical Functional ROM: Unrestricted ROM       Stability: No instability detected Muscle Tone/Strength: Functionally intact. No obvious neuro-muscular anomalies detected. Sensory (Neurological): Unimpaired Palpation: No palpable anomalies       Provocative Tests: Lumbar  Hyperextension/rotation test: deferred today       Lumbar quadrant test (Kemp's test): deferred today       Lumbar Lateral bending test: deferred today       Patrick's Maneuver: deferred today                   FABER test: deferred today       Thigh-thrust test: deferred today       S-I compression test: deferred today       S-I distraction test: deferred today        Gait & Posture Assessment  Ambulation: Unassisted Gait: Relatively normal for age and body habitus Posture: WNL   Lower Extremity Exam    Side: Right lower extremity  Side: Left lower extremity  Stability: No instability observed          Stability: No instability observed          Skin & Extremity Inspection: Skin color, temperature, and hair growth are WNL. No peripheral edema or cyanosis. No masses, redness, swelling, asymmetry, or associated skin lesions. No contractures.  Skin & Extremity Inspection: Skin color, temperature, and hair growth are WNL. No peripheral edema or cyanosis. No masses, redness, swelling, asymmetry, or associated skin lesions. No contractures.  Functional ROM: Unrestricted ROM                  Functional ROM: Unrestricted ROM                  Muscle Tone/Strength: Functionally intact. No obvious neuro-muscular anomalies detected.  Muscle Tone/Strength: Functionally intact. No obvious neuro-muscular anomalies detected.  Sensory (Neurological): Unimpaired  Sensory (Neurological): Unimpaired  Palpation: No palpable anomalies  Palpation: No palpable anomalies   Assessment  Primary Diagnosis &  Pertinent Problem List: There were no encounter diagnoses.  Status Diagnosis  Controlled Controlled Controlled No diagnosis found.  Problems updated and reviewed during this visit: No problems updated. Plan of Care  Pharmacotherapy (Medications Ordered): No orders of the defined types were placed in this encounter.  Medications administered today: Emily [name] had no medications administered  during this visit.  Procedure Orders    No procedure(s) ordered today   Lab Orders  No laboratory test(s) ordered today   Imaging Orders  No imaging studies ordered today   Referral Orders  No referral(s) requested today    Interventional management options: Planned, scheduled, and/or pending:   ***   Considering:   ***   Palliative PRN treatment(s):   None at this time   Provider-requested follow-up: No follow-ups on file.  Future Appointments  Date Time Provider [place]  06/25/2017  8:30 AM Milinda Pointer, MD [facility] None  08/21/2017  1:30 PM Charlcie Cradle, MD [facility] None  09/05/2017 10:50 AM Pieter Partridge, DO [facility] None  12/02/2017 10:00 AM Cameron Sprang, MD [facility] None   Primary Care Physician: Monico Blitz, MD Location: [facility] Note by: Gaspar Cola, MD Date: 06/25/2017; Time: 8:08 AM

## 2017-06-25 ENCOUNTER — Ambulatory Visit: Payer: Medicare Other | Admitting: Pain Medicine

## 2017-07-03 DIAGNOSIS — W19XXXA Unspecified fall, initial encounter: Secondary | ICD-10-CM | POA: Diagnosis not present

## 2017-07-03 DIAGNOSIS — I10 Essential (primary) hypertension: Secondary | ICD-10-CM | POA: Diagnosis not present

## 2017-07-03 DIAGNOSIS — M25552 Pain in left hip: Secondary | ICD-10-CM | POA: Diagnosis not present

## 2017-07-03 DIAGNOSIS — Z6841 Body Mass Index (BMI) 40.0 and over, adult: Secondary | ICD-10-CM | POA: Diagnosis not present

## 2017-07-03 DIAGNOSIS — S79912A Unspecified injury of left hip, initial encounter: Secondary | ICD-10-CM | POA: Diagnosis not present

## 2017-07-03 DIAGNOSIS — Z299 Encounter for prophylactic measures, unspecified: Secondary | ICD-10-CM | POA: Diagnosis not present

## 2017-07-03 DIAGNOSIS — M79605 Pain in left leg: Secondary | ICD-10-CM | POA: Diagnosis not present

## 2017-07-07 DIAGNOSIS — G35 Multiple sclerosis: Secondary | ICD-10-CM | POA: Diagnosis not present

## 2017-07-07 DIAGNOSIS — H469 Unspecified optic neuritis: Secondary | ICD-10-CM | POA: Diagnosis not present

## 2017-07-07 DIAGNOSIS — H179 Unspecified corneal scar and opacity: Secondary | ICD-10-CM | POA: Diagnosis not present

## 2017-07-07 DIAGNOSIS — H47291 Other optic atrophy, right eye: Secondary | ICD-10-CM | POA: Diagnosis not present

## 2017-07-21 DIAGNOSIS — M50121 Cervical disc disorder at C4-C5 level with radiculopathy: Secondary | ICD-10-CM | POA: Diagnosis not present

## 2017-07-28 ENCOUNTER — Telehealth: Payer: Self-pay | Admitting: Neurology

## 2017-07-28 NOTE — Telephone Encounter (Signed)
Patient is not sure which Dr she needs to talk to about increase pain [name] or the neurosurgeon. She states that she is having increase not back pain but her MS pain. She also states that she has the feeling of having to throw up all the times please call patient

## 2017-07-28 NOTE — Telephone Encounter (Signed)
Patient is going to contact her Neurosurgeon for her pain and nausea.

## 2017-07-30 ENCOUNTER — Telehealth: Payer: Self-pay | Admitting: Neurology

## 2017-07-30 NOTE — Telephone Encounter (Signed)
Patient has tried to contact her pain management provider and they told her there is nothing they can do for her pain and nausea due to all of her allergies.  Patient is taking reglan but its not helping.  Patient has received back injections but they are not helping.  Patient is going to contact her old pain management but would like something else for nausea.  Please advise.

## 2017-07-30 NOTE — Telephone Encounter (Signed)
Has she tried Zofran in the past? If she is agreeable to trying Zofran, pls send in Rx for Zofran 4mg  every 8 hours as needed, #30 with 6 refills, thanks

## 2017-07-30 NOTE — Telephone Encounter (Signed)
Can you pls get more info from patient, what is the issue? If it is pain, regardless of cause, it should still be her Pain specialist who treats this, we do not treat pain. I'm not quite sure what she means that they only see her for injections and not pain, she sees Pain Management. Thanks

## 2017-07-30 NOTE — Telephone Encounter (Signed)
Patient cannot take zofran or phenergan.  Suggested she get some peppermint and sea bands.  She called her old pain management but they cannot see her without a referral.  Advised her to contact her pcp and get the referral process started.  Is there any pain management that specializes in MS?  Can we set up another referral for pain management?  Psych?

## 2017-07-30 NOTE — Telephone Encounter (Signed)
Patient is having a flair up of the MS pain and called the neuro spine specialist and they want her to come here for this. They only see her for the injections not pain. [name] is booked until March of 2020. Please advise

## 2017-07-31 MED ORDER — PROCHLORPERAZINE MALEATE 10 MG PO TABS: 10.0000 mg | ORAL_TABLET | Freq: Four times a day (QID) | ORAL | 3 refills | Status: DC | PRN

## 2017-07-31 NOTE — Telephone Encounter (Signed)
Spoke with patient today.  She is going to follow up with her PCP.  Patient has never tried compazine and would like to try.  RX sent in.  She is feeling better today.  She is wearing sea bands and using peppermint spray.  She will call us if she has any other issues.

## 2017-07-31 NOTE — Telephone Encounter (Signed)
Has she tried compazine for nausea? If she has tried this as well, maybe a phenergan suppository. I am not aware of Pain [facility]gt that specializes in MS. We can send the referral to her old pain mgt, I believe, unless they require PCP referral. Thanks

## 2017-08-01 NOTE — Telephone Encounter (Signed)
Patient is aware she needs to discuss pain management with PCP while waiting for appt with new pain management.

## 2017-08-01 NOTE — Telephone Encounter (Signed)
[phone]  Nausea better, -- Pain is not, Please advise - Pain clinic is comfortable treating for MS complatplicated case due to being allergic to medications then Dr Merlene Laughter has tier approval, so it will take awhile for approval since it has been so long since she has been seen.

## 2017-08-04 ENCOUNTER — Telehealth: Payer: Self-pay

## 2017-08-04 NOTE — Telephone Encounter (Signed)
[facility].[phone]. I chose option to speak with a pharmacist about a new RX, X 2, both times it would not connect

## 2017-08-13 DIAGNOSIS — Z87891 Personal history of nicotine dependence: Secondary | ICD-10-CM | POA: Diagnosis not present

## 2017-08-13 DIAGNOSIS — I10 Essential (primary) hypertension: Secondary | ICD-10-CM | POA: Diagnosis not present

## 2017-08-13 DIAGNOSIS — Z299 Encounter for prophylactic measures, unspecified: Secondary | ICD-10-CM | POA: Diagnosis not present

## 2017-08-13 DIAGNOSIS — J069 Acute upper respiratory infection, unspecified: Secondary | ICD-10-CM | POA: Diagnosis not present

## 2017-08-13 DIAGNOSIS — Z6841 Body Mass Index (BMI) 40.0 and over, adult: Secondary | ICD-10-CM | POA: Diagnosis not present

## 2017-08-13 DIAGNOSIS — K645 Perianal venous thrombosis: Secondary | ICD-10-CM | POA: Diagnosis not present

## 2017-08-13 DIAGNOSIS — G43909 Migraine, unspecified, not intractable, without status migrainosus: Secondary | ICD-10-CM | POA: Diagnosis not present

## 2017-08-13 DIAGNOSIS — Q232 Congenital mitral stenosis: Secondary | ICD-10-CM | POA: Diagnosis not present

## 2017-08-18 DIAGNOSIS — Z299 Encounter for prophylactic measures, unspecified: Secondary | ICD-10-CM | POA: Diagnosis not present

## 2017-08-18 DIAGNOSIS — R109 Unspecified abdominal pain: Secondary | ICD-10-CM | POA: Diagnosis not present

## 2017-08-18 DIAGNOSIS — Z6841 Body Mass Index (BMI) 40.0 and over, adult: Secondary | ICD-10-CM | POA: Diagnosis not present

## 2017-08-18 DIAGNOSIS — I10 Essential (primary) hypertension: Secondary | ICD-10-CM | POA: Diagnosis not present

## 2017-08-18 DIAGNOSIS — F419 Anxiety disorder, unspecified: Secondary | ICD-10-CM | POA: Diagnosis not present

## 2017-08-18 DIAGNOSIS — R319 Hematuria, unspecified: Secondary | ICD-10-CM | POA: Diagnosis not present

## 2017-08-20 DIAGNOSIS — I10 Essential (primary) hypertension: Secondary | ICD-10-CM | POA: Diagnosis not present

## 2017-08-20 DIAGNOSIS — F329 Major depressive disorder, single episode, unspecified: Secondary | ICD-10-CM | POA: Diagnosis not present

## 2017-08-20 DIAGNOSIS — Z79891 Long term (current) use of opiate analgesic: Secondary | ICD-10-CM | POA: Diagnosis not present

## 2017-08-20 DIAGNOSIS — G4489 Other headache syndrome: Secondary | ICD-10-CM | POA: Diagnosis not present

## 2017-08-20 DIAGNOSIS — G35 Multiple sclerosis: Secondary | ICD-10-CM | POA: Diagnosis not present

## 2017-08-20 DIAGNOSIS — Z888 Allergy status to other drugs, medicaments and biological substances status: Secondary | ICD-10-CM | POA: Diagnosis not present

## 2017-08-20 DIAGNOSIS — R457 State of emotional shock and stress, unspecified: Secondary | ICD-10-CM | POA: Diagnosis not present

## 2017-08-20 DIAGNOSIS — F419 Anxiety disorder, unspecified: Secondary | ICD-10-CM | POA: Diagnosis not present

## 2017-08-20 DIAGNOSIS — R51 Headache: Secondary | ICD-10-CM | POA: Diagnosis not present

## 2017-08-20 DIAGNOSIS — Z79899 Other long term (current) drug therapy: Secondary | ICD-10-CM | POA: Diagnosis not present

## 2017-08-20 DIAGNOSIS — Z87891 Personal history of nicotine dependence: Secondary | ICD-10-CM | POA: Diagnosis not present

## 2017-08-20 DIAGNOSIS — R0789 Other chest pain: Secondary | ICD-10-CM | POA: Diagnosis not present

## 2017-08-21 ENCOUNTER — Ambulatory Visit (INDEPENDENT_AMBULATORY_CARE_PROVIDER_SITE_OTHER): Payer: Medicare Other | Admitting: Psychiatry

## 2017-08-21 ENCOUNTER — Encounter (HOSPITAL_COMMUNITY): Payer: Self-pay | Admitting: Psychiatry

## 2017-08-21 VITALS — BP 126/90 | HR 84 | Ht 63.5 in | Wt 258.0 lb

## 2017-08-21 DIAGNOSIS — F4310 Post-traumatic stress disorder, unspecified: Secondary | ICD-10-CM | POA: Diagnosis not present

## 2017-08-21 DIAGNOSIS — F332 Major depressive disorder, recurrent severe without psychotic features: Secondary | ICD-10-CM | POA: Diagnosis not present

## 2017-08-21 DIAGNOSIS — F4001 Agoraphobia with panic disorder: Secondary | ICD-10-CM | POA: Diagnosis not present

## 2017-08-21 DIAGNOSIS — F411 Generalized anxiety disorder: Secondary | ICD-10-CM

## 2017-08-21 DIAGNOSIS — G43109 Migraine with aura, not intractable, without status migrainosus: Secondary | ICD-10-CM | POA: Diagnosis not present

## 2017-08-21 DIAGNOSIS — F445 Conversion disorder with seizures or convulsions: Secondary | ICD-10-CM

## 2017-08-21 MED ORDER — DIPHENHYDRAMINE HCL (SLEEP) 25 MG PO TABS: 50.0000 mg | ORAL_TABLET | Freq: Every evening | ORAL | 1 refills | Status: DC | PRN

## 2017-08-21 MED ORDER — FLUOXETINE HCL 40 MG PO CAPS: 80.0000 mg | ORAL_CAPSULE | Freq: Every day | ORAL | 1 refills | Status: DC

## 2017-08-21 MED ORDER — BUSPIRONE HCL 15 MG PO TABS: 15.0000 mg | ORAL_TABLET | Freq: Two times a day (BID) | ORAL | 1 refills | Status: DC

## 2017-08-21 MED ORDER — DIAZEPAM 5 MG PO TABS: 5.0000 mg | ORAL_TABLET | Freq: Two times a day (BID) | ORAL | 1 refills | Status: DC | PRN
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
## 2017-09-01 ENCOUNTER — Telehealth: Payer: Self-pay | Admitting: Neurology

## 2017-09-01 NOTE — Telephone Encounter (Signed)
Patient called needing to see if she needed to order her Botox or would our office? Please Call. Thanks

## 2017-09-02 ENCOUNTER — Telehealth: Payer: Self-pay

## 2017-09-02 NOTE — Telephone Encounter (Signed)
Called [facility] for Pt, that Botox will be delivered to our office

## 2017-09-02 NOTE — Telephone Encounter (Signed)
Patient calling again about this wanting to know if our office had the botox or if it was suppose to come to her. Please advise

## 2017-09-02 NOTE — Telephone Encounter (Signed)
[phone] to [facility] B[facility] delivered, spoke with Roland. Botox to be delivered tomorrow.  Called and [facility] advising Pt

## 2017-09-03 ENCOUNTER — Telehealth: Payer: Self-pay | Admitting: Neurology

## 2017-09-03 DIAGNOSIS — G35 Multiple sclerosis: Secondary | ICD-10-CM | POA: Diagnosis not present

## 2017-09-03 DIAGNOSIS — R52 Pain, unspecified: Secondary | ICD-10-CM | POA: Diagnosis not present

## 2017-09-03 DIAGNOSIS — R262 Difficulty in walking, not elsewhere classified: Secondary | ICD-10-CM | POA: Diagnosis not present

## 2017-09-03 DIAGNOSIS — Z87891 Personal history of nicotine dependence: Secondary | ICD-10-CM | POA: Diagnosis not present

## 2017-09-03 DIAGNOSIS — I10 Essential (primary) hypertension: Secondary | ICD-10-CM | POA: Diagnosis not present

## 2017-09-03 DIAGNOSIS — R0689 Other abnormalities of breathing: Secondary | ICD-10-CM | POA: Diagnosis not present

## 2017-09-03 DIAGNOSIS — Z79899 Other long term (current) drug therapy: Secondary | ICD-10-CM | POA: Diagnosis not present

## 2017-09-03 DIAGNOSIS — Z888 Allergy status to other drugs, medicaments and biological substances status: Secondary | ICD-10-CM | POA: Diagnosis not present

## 2017-09-03 NOTE — Telephone Encounter (Signed)
Received a call from on-call service.  The physician in the ED at [facility] wished to speak with me regarding Emily [name], who is a patient of Dr. Delice Lesch.  She has MS and presented to the ED with onset of severe bilateral lower extremity pain.  She reportedly never had experienced this before.  She is unable to bear weight and ambulate.  I was called for guidance.  Given the information that this was an abrupt onset of new symptoms in which she cannot walk, I advised that she receive SoluMedrol 1000mg  IV daily for 3 days.  It can be set up in the outpatient setting.  However, since she cannot walk, this would likely need to be performed as an inpatient.  If she would be admitted, then MRI of brain and spinal cord would be helpful as well.  When I later had a chance to review the notes, it appears that she does in fact have a history of chronic pain, so it may be a chronic issue.  However, I am told she is unable to walk, so admission with treatment of steroids and pain management would be prudent.

## 2017-09-04 ENCOUNTER — Ambulatory Visit (INDEPENDENT_AMBULATORY_CARE_PROVIDER_SITE_OTHER): Payer: Medicare Other

## 2017-09-04 DIAGNOSIS — G43611 Persistent migraine aura with cerebral infarction, intractable, with status migrainosus: Secondary | ICD-10-CM

## 2017-09-04 DIAGNOSIS — I639 Cerebral infarction, unspecified: Secondary | ICD-10-CM

## 2017-09-04 MED ORDER — DEXTROSE 5 % IV SOLN
60.0000 mg | Freq: Once | INTRAVENOUS | Status: AC
  Administered 2017-09-04: 60 mg via INTRAMUSCULAR

## 2017-09-04 MED ORDER — KETOROLAC TROMETHAMINE 60 MG/2ML IM SOLN: 60.0000 mg | Freq: Once | INTRAMUSCULAR | Status: AC

## 2017-09-04 MED ORDER — DIPHENHYDRAMINE HCL 50 MG/ML IJ SOLN
25.0000 mg | Freq: Once | INTRAMUSCULAR | Status: AC
  Administered 2017-09-04: 25 mg via INTRAMUSCULAR

## 2017-09-05 ENCOUNTER — Ambulatory Visit (INDEPENDENT_AMBULATORY_CARE_PROVIDER_SITE_OTHER): Payer: Medicare Other | Admitting: Neurology

## 2017-09-05 DIAGNOSIS — G43709 Chronic migraine without aura, not intractable, without status migrainosus: Secondary | ICD-10-CM | POA: Diagnosis not present

## 2017-09-05 MED ORDER — ONABOTULINUMTOXINA 100 UNITS IJ SOLR
200.0000 [IU] | Freq: Once | INTRAMUSCULAR | Status: AC
  Administered 2017-09-05: 200 [IU] via INTRAMUSCULAR

## 2017-09-05 NOTE — Progress Notes (Signed)
[facility]   Procedure Note Botox  Attending: Dr. Thomasa Heidler  Preoperative Diagnosis(es): Chronic migraine  Consent obtained from: The patient Benefits discussed included, but were not limited to decreased muscle tightness, increased joint range of motion, and decreased pain.  Risk discussed included, but were not limited pain and discomfort, bleeding, bruising, excessive weakness, venous thrombosis, muscle atrophy and dysphagia.  Anticipated outcomes of the procedure as well as he risks and benefits of the alternatives to the procedure, and the roles and tasks of the personnel to be involved, were discussed with the patient, and the patient consents to the procedure and agrees to proceed. A copy of the patient medication guide was given to the patient which explains the blackbox warning.  Patients identity and treatment sites confirmed Yes.  .  Details of Procedure: Skin was cleaned with alcohol. Prior to injection, the needle plunger was aspirated to make sure the needle was not within a blood vessel.  There was no blood retrieved on aspiration.    Following is a summary of the muscles injected  And the amount of Botulinum toxin used:  Dilution 200 units of Botox was reconstituted with 4 ml of preservative free normal saline. Time of reconstitution: At the time of the office visit (<30 minutes prior to injection)   Injections  155 total units of Botox was injected with a 30 gauge needle.  Injection Sites: L occipitalis: 15 units- 3 sites  R occiptalis: 15 units- 3 sites  L upper trapezius: 15 units- 3 sites R upper trapezius: 15 units- 3 sits          L paraspinal: 10 units- 2 sites R paraspinal: 10 units- 2 sites  Face L frontalis(2 injection sites):10 units   R frontalis(2 injection sites):10 units         L corrugator: 5 units   R corrugator: 5 units           Procerus: 5 units   L temporalis: 20 units R temporalis: 20 units   Agent:  200 units of botulinum Type  A (Onobotulinum Toxin type A) was reconstituted with 4 ml of preservative free normal saline.  Time of reconstitution: At the time of the office visit (<30 minutes prior to injection)     Total injected (Units):  155  Total wasted (Units):  None wasted  Patient tolerated procedure well without complications.   Reinjection is anticipated in 3 months.   

## 2017-09-10 ENCOUNTER — Telehealth: Payer: Self-pay | Admitting: Neurology

## 2017-09-10 NOTE — Telephone Encounter (Signed)
Faxed request for records to [facility] for [facility] 09/10/17 LM

## 2017-09-16 DIAGNOSIS — M79606 Pain in leg, unspecified: Secondary | ICD-10-CM | POA: Diagnosis not present

## 2017-09-16 DIAGNOSIS — R269 Unspecified abnormalities of gait and mobility: Secondary | ICD-10-CM | POA: Diagnosis not present

## 2017-09-16 DIAGNOSIS — M545 Low back pain: Secondary | ICD-10-CM | POA: Diagnosis not present

## 2017-09-16 DIAGNOSIS — Z79891 Long term (current) use of opiate analgesic: Secondary | ICD-10-CM | POA: Diagnosis not present

## 2017-09-16 DIAGNOSIS — M542 Cervicalgia: Secondary | ICD-10-CM | POA: Diagnosis not present

## 2017-09-16 DIAGNOSIS — G35 Multiple sclerosis: Secondary | ICD-10-CM | POA: Diagnosis not present

## 2017-10-02 ENCOUNTER — Telehealth (HOSPITAL_COMMUNITY): Payer: Self-pay | Admitting: *Deleted

## 2017-10-02 NOTE — Telephone Encounter (Signed)
Patient called to let her MD know that she is having night terrors more and feels more depressed and anxious. States that she is managing and can make it to her next appointment in October but wants her to be aware so they can discuss at her appointment. She will call back if it worsens or if she feels suicidal.

## 2017-10-07 DIAGNOSIS — Z79899 Other long term (current) drug therapy: Secondary | ICD-10-CM | POA: Diagnosis not present

## 2017-10-07 DIAGNOSIS — Z299 Encounter for prophylactic measures, unspecified: Secondary | ICD-10-CM | POA: Diagnosis not present

## 2017-10-07 DIAGNOSIS — Z23 Encounter for immunization: Secondary | ICD-10-CM | POA: Diagnosis not present

## 2017-10-07 DIAGNOSIS — I10 Essential (primary) hypertension: Secondary | ICD-10-CM | POA: Diagnosis not present

## 2017-10-07 DIAGNOSIS — Z6841 Body Mass Index (BMI) 40.0 and over, adult: Secondary | ICD-10-CM | POA: Diagnosis not present

## 2017-10-07 DIAGNOSIS — Z7189 Other specified counseling: Secondary | ICD-10-CM | POA: Diagnosis not present

## 2017-10-07 DIAGNOSIS — Z1331 Encounter for screening for depression: Secondary | ICD-10-CM | POA: Diagnosis not present

## 2017-10-07 DIAGNOSIS — Z1339 Encounter for screening examination for other mental health and behavioral disorders: Secondary | ICD-10-CM | POA: Diagnosis not present

## 2017-10-07 DIAGNOSIS — Z0000 Encounter for general adult medical examination without abnormal findings: Secondary | ICD-10-CM | POA: Diagnosis not present

## 2017-10-15 DIAGNOSIS — R269 Unspecified abnormalities of gait and mobility: Secondary | ICD-10-CM | POA: Diagnosis not present

## 2017-10-15 DIAGNOSIS — G35 Multiple sclerosis: Secondary | ICD-10-CM | POA: Diagnosis not present

## 2017-10-15 DIAGNOSIS — M542 Cervicalgia: Secondary | ICD-10-CM | POA: Diagnosis not present

## 2017-10-15 DIAGNOSIS — M545 Low back pain: Secondary | ICD-10-CM | POA: Diagnosis not present

## 2017-10-23 ENCOUNTER — Ambulatory Visit (INDEPENDENT_AMBULATORY_CARE_PROVIDER_SITE_OTHER): Payer: Medicare Other | Admitting: Psychiatry

## 2017-10-23 ENCOUNTER — Encounter (HOSPITAL_COMMUNITY): Payer: Self-pay | Admitting: Psychiatry

## 2017-10-23 DIAGNOSIS — G43611 Persistent migraine aura with cerebral infarction, intractable, with status migrainosus: Secondary | ICD-10-CM | POA: Diagnosis not present

## 2017-10-23 DIAGNOSIS — F445 Conversion disorder with seizures or convulsions: Secondary | ICD-10-CM

## 2017-10-23 DIAGNOSIS — F332 Major depressive disorder, recurrent severe without psychotic features: Secondary | ICD-10-CM | POA: Diagnosis not present

## 2017-10-23 DIAGNOSIS — F4310 Post-traumatic stress disorder, unspecified: Secondary | ICD-10-CM

## 2017-10-23 DIAGNOSIS — F411 Generalized anxiety disorder: Secondary | ICD-10-CM | POA: Diagnosis not present

## 2017-10-23 DIAGNOSIS — I639 Cerebral infarction, unspecified: Secondary | ICD-10-CM | POA: Diagnosis not present

## 2017-10-23 DIAGNOSIS — F4001 Agoraphobia with panic disorder: Secondary | ICD-10-CM

## 2017-10-23 MED ORDER — FLUOXETINE HCL 40 MG PO CAPS: 80.0000 mg | ORAL_CAPSULE | Freq: Every day | ORAL | 1 refills | Status: DC

## 2017-10-23 MED ORDER — DIAZEPAM 5 MG PO TABS: 5.0000 mg | ORAL_TABLET | Freq: Two times a day (BID) | ORAL | 1 refills | Status: DC | PRN

## 2017-10-23 MED ORDER — BUSPIRONE HCL 15 MG PO TABS: 15.0000 mg | ORAL_TABLET | Freq: Three times a day (TID) | ORAL | 1 refills | Status: DC

## 2017-10-23 MED ORDER — LITHIUM CARBONATE ER 300 MG PO TBCR: 300.0000 mg | EXTENDED_RELEASE_TABLET | Freq: Every day | ORAL | 1 refills | Status: DC
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
## 2017-11-04 ENCOUNTER — Other Ambulatory Visit (HOSPITAL_COMMUNITY): Payer: Self-pay | Admitting: Psychiatry

## 2017-11-04 DIAGNOSIS — F411 Generalized anxiety disorder: Secondary | ICD-10-CM

## 2017-11-06 ENCOUNTER — Telehealth: Payer: Self-pay | Admitting: Neurology

## 2017-11-06 NOTE — Telephone Encounter (Signed)
Patient called and wanted to let Dr. Delice Lesch know about her Psychiatrist  Dr. Doyne Keel had put her on Lithium Carbinate 300 MG Tablets ER. She said it has made her feel sick. She said she is eating but it comes back up. Her Pain Management Doctor increased her Belbuca 2 x a day at 150 mg. She is on her lithium to help with her Depression. Please Call. Thanks

## 2017-11-12 DIAGNOSIS — Z79891 Long term (current) use of opiate analgesic: Secondary | ICD-10-CM | POA: Diagnosis not present

## 2017-11-12 DIAGNOSIS — G35 Multiple sclerosis: Secondary | ICD-10-CM | POA: Diagnosis not present

## 2017-11-12 DIAGNOSIS — M545 Low back pain: Secondary | ICD-10-CM | POA: Diagnosis not present

## 2017-11-12 DIAGNOSIS — G894 Chronic pain syndrome: Secondary | ICD-10-CM | POA: Diagnosis not present

## 2017-11-12 DIAGNOSIS — R269 Unspecified abnormalities of gait and mobility: Secondary | ICD-10-CM | POA: Diagnosis not present

## 2017-11-12 DIAGNOSIS — M542 Cervicalgia: Secondary | ICD-10-CM | POA: Diagnosis not present

## 2017-11-14 ENCOUNTER — Other Ambulatory Visit (HOSPITAL_COMMUNITY): Payer: Self-pay | Admitting: Psychiatry

## 2017-11-14 DIAGNOSIS — F332 Major depressive disorder, recurrent severe without psychotic features: Secondary | ICD-10-CM

## 2017-11-29 ENCOUNTER — Other Ambulatory Visit: Payer: Self-pay | Admitting: Neurology

## 2017-12-01 DIAGNOSIS — M50122 Cervical disc disorder at C5-C6 level with radiculopathy: Secondary | ICD-10-CM | POA: Diagnosis not present

## 2017-12-02 ENCOUNTER — Other Ambulatory Visit: Payer: Self-pay

## 2017-12-02 ENCOUNTER — Telehealth: Payer: Self-pay | Admitting: Neurology

## 2017-12-02 ENCOUNTER — Ambulatory Visit (INDEPENDENT_AMBULATORY_CARE_PROVIDER_SITE_OTHER): Payer: Medicare Other | Admitting: Neurology

## 2017-12-02 ENCOUNTER — Encounter: Payer: Self-pay | Admitting: Neurology

## 2017-12-02 VITALS — BP 128/84 | HR 89 | Wt 265.0 lb

## 2017-12-02 DIAGNOSIS — G43709 Chronic migraine without aura, not intractable, without status migrainosus: Secondary | ICD-10-CM

## 2017-12-02 DIAGNOSIS — G43109 Migraine with aura, not intractable, without status migrainosus: Secondary | ICD-10-CM

## 2017-12-02 DIAGNOSIS — G43611 Persistent migraine aura with cerebral infarction, intractable, with status migrainosus: Secondary | ICD-10-CM | POA: Diagnosis not present

## 2017-12-02 DIAGNOSIS — G35 Multiple sclerosis: Secondary | ICD-10-CM | POA: Diagnosis not present

## 2017-12-02 DIAGNOSIS — F445 Conversion disorder with seizures or convulsions: Secondary | ICD-10-CM

## 2017-12-02 DIAGNOSIS — I639 Cerebral infarction, unspecified: Secondary | ICD-10-CM | POA: Diagnosis not present

## 2017-12-02 MED ORDER — TOPIRAMATE 100 MG PO TABS: ORAL_TABLET | ORAL | 3 refills | Status: DC

## 2017-12-02 MED ORDER — FINGOLIMOD HCL 0.5 MG PO CAPS: 0.5000 mg | ORAL_CAPSULE | Freq: Every day | ORAL | 11 refills | Status: DC

## 2017-12-02 MED ORDER — DIVALPROEX SODIUM 500 MG PO DR TAB: 500.0000 mg | DELAYED_RELEASE_TABLET | Freq: Every day | ORAL | 3 refills | Status: DC

## 2017-12-02 MED ORDER — ELETRIPTAN HYDROBROMIDE 20 MG PO TABS: ORAL_TABLET | ORAL | 11 refills | Status: DC

## 2017-12-02 NOTE — Progress Notes (Signed)
NEUROLOGY FOLLOW UP OFFICE NOTE  Aribella L Rasp [id number]  DOB: 03/31/1980  HISTORY OF PRESENT ILLNESS: I had the pleasure of seeing Emily [name] in follow-up in the neurology clinic on 12/02/2017.  The patient was last seen 6 months ago for migraines and MS. She is taking Gilenya without side effects and denies any clear MS flares. I personally reviewed MRI brain with and without contrast done last May 2019 which did not show any enhancing lesions, there was note of greater than 10 subcentimeter supratentorial white matter FLAIR T2 hyperintensities some of which radiate from the periventricular margin. She has been to [facility] for her cervical stenosis and was not felt to be a surgical candidate since her main complaint was pain, no myelopathy on exam. She sees neurologist Dr. Merlene Laughter for pain management, and is on [place] for pain. She feels this makes her drowsy. She has been going to [facility] for steroid injections and reports that she had an injection yesterday and during the procedure she started having shooting electricity down her left arm. As the procedure continued, she jumped and started feeling the sensation over her left arm, trunk, and down her left leg. Symptoms subsided a little but when she got home, the symptoms felt like they were worsening, as if she was getting a low level TENS procedure. She reports the migraines occur 2-3 times a month but last for days. She is on Topamax 150mg  BID and Depakote 500mg  qhs for migraine prophylaxis. She is also on Lyrica 100mg  TID. She is taking sumatriptan but feels it is not helping as much. She continues to deal with a lot of nausea. Her next Botox session is in January 2020. She reports having a migraine today. She has not had any psychogenic shaking spells for a long time now, as long as she takes the daily diazepam. She continues to work with her psychiatrist and was started on Lithium since her last visit.    Diagnostic Data: She had an MRI cervical spine and lumbar spine in February 2019 which showed chronic but progressed C6-C7 disc and endplate degeneration. Patchy C6 vertebral body marrow edema is new and appears degenerative in nature. Leftward disc herniation appears increased, with associated spinal stenosis, spinal cord mass effect, and moderate bilateral neural foraminal stenosis. Query left C7 radiculitis. Advanced chronic disc and endplate degeneration also at C5-C6, but stable to mildly improved since 2015. Chronic mild spinal stenosis and spinal cord mass effect at that level. Subtle chronic upper cervical spinal cord signal abnormality compatible with chronic multiple sclerosis. Questionable small upper thoracic cord lesion at T2-T3. Lumbar MRI showed advanced widespread lumbar facet arthropathy, severe in the lower lumbar spine and maximal on the left side at L5-S1. There is associated mild left L5 neural foraminal stenosis (query radiculitis), but facet disease alone also can be a source of lumbar back pain which sometimes radiates. Chronic T12-L1 disc degeneration without stenosis  HPI 03/28/15: This is a pleasant 37 yo RH woman with a history of relapsing-remitting multiple sclerosis, headaches, anxiety with panic attacks, conversion disorder with seizures. She presented at [redacted] weeks pregnant to determine if diazepam for anxiety/seizures would be a medication she can use, understanding this is pregnancy category D versus risks of injuring herself from falls when she has the psychogenic seizures. Records from her previous neurologists at [facility] were reviewed. She was diagnosed with MS in June 2008 while living in [place] state, she started having right thigh pain and right eye  blurred vision. She was diagnosed with right optic neuritis and underwent MRI brain and lumbar puncture which confirmed a diagnosis of MS. 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 She has been on Gilenya since 2014 without any relapse. This was discontinued in December 2016 when she found out she was pregnant. She has a history of chronic pain and migraines, and was taking Depakote and Topamax for headache prophylaxis, also stopped in December 2016. She reports having Botox for her migraines, but so far migraines are not as bad as they were.  She started having shaking episodes in April 2016. Initially Depakote dose was increased to 500mg  BID. She was back in May 2016 with multiple episodes of syncope, palpitations, a sensation of her heart jumping out of her chest. She also reported lightheadedness, facial numbness, and numbness in her hands and feet. On her last visit in June 2016 at [facility], shaking episodes were discussed. Events always occur after a stressful event. She had a spell in [place] where she had a normal EEG and was diagnosed with a conversion disorder. She had seen Dr. Darleene Cleaver and reportedly told her he could not help her because "her case was too complex." She started seeing a psychiatrist in [place], however since she switched neurologists, she reports that he cannot see her anymore. She has not been able to establish care with [facility] and continues to have anxiety and panic attacks "all the time." She is diaphoretic in the office today and reports she always feels anxious. When she starts having one of her seizures, she would have significant anxiety, "my body can't deal and goes into a seizure," she hears her heartbeat in her ears. If it slowly builds up, she can get herself to a safe place, however other times it "just hits me," and she falls to the ground and has injured herself. She reports falling out one time in the bathroom during a doctor visit. She is currently on Fluoxetine and Buspirone. Clonazepam in the past did not help. She would  usually go to the ER for these spells, which would quiet down once she gets Benadryl and Diazepam.  She delivered early at [redacted] weeks gestation last July 12, 2015. A week after delivery, she started having numbness on the top of both feet, pain in the balls of her feet, with numbness traveling up to her mid-back. She also has numbness in both forearms, and reports her palms feel the same as her soles, with constant stabbing burning pain. She had these symptoms pre-pregnancy but states the numbness only went up to her knees. During pregnancy, all her symptoms were less bothersome. She has been following with Pain management in [facility] and would occasionally take Oxycodone prior to her pregnancy.  Diagnostic Data:  I personally reviewed MRI brain with and without contrast done with [facility] last 10/02/13 which did not show any acute changes, there was multiple T2/FLAIR lesions in the subcortical and deep white matter regions, no abnormal enhancement. Hippocampi appear asymmetric, smaller on the right without abnormal signal or enhancement.  MRI C-spine with and without contrast 10/18/13 showed hazy T2 hyperintensity within the cord from C2-3 to C4-5 without abnormal enhancement.  per [facility] - MRI brain done 03/18/14: "No change in numerous multifocal T2 hyperintense white matter lesions relative to prior MRI from 04/17/2013 most consistent with demyelinating lesions given history of multiple  sclerosis. No evidence of active demyelination."  MRI cervical spine done 03/18/14: "Relative to prior MRI from 10/20/2009, no change in patchy foci of increased T2 signal in the dorsal cord at C2- C4 in keeping with demyelinating lesions in this patient with a clinical history of multiple sclerosis.  No evidence of enhancement to suggest active demyelination. Moderately advanced degenerative disease at C5-C6 and C6-C7 with interval development of left eccentric central, foraminal entry zone and foraminal disc extrusion  at C6-C7 exerting mass effect on the exiting C7 and descending C8 roots.  MRI brain and C-spine 07/30/2015 with and without contrast : There was no abnormal enhancement. There were no changes from previous MRI brain in 09/2013 with multiple bilateral subcortical greater than 10 periventricular T2 hyperintensities. There were no cord lesions seen up to T3-4. There was chronic degenerative disc disease and central canal stenosis at C5-6 and C6-7, no abnormal cord signal, unchanged from 06/2014.  EMG/NCV done on both UE and right LE showed bilateral carpal tunnel syndrome, mild to moderate in degree, no evidence of polyneuropathy or radiculopathy on the right side.  PAST MEDICAL HISTORY: Past Medical History:  Diagnosis Date  . Anxiety   . Asthma as a child  . Chronic back pain   . Chronic pain   . Conversion disorder   . Depression   . GERD (gastroesophageal reflux disease)   . Headache(784.0)    migraines  . HTN (hypertension) 02/27/2015  . JC virus antibody positive   . Migraines   . MS (multiple sclerosis) ([place])    06-2006  . Multiple sclerosis ([place])   . Ovarian cyst   . Panic attack   . Perforated bowel ([place]) 2009  . Pseudoseizures   . PTSD (post-traumatic stress disorder)   . S/P emergency C-section   . Urinary urgency     MEDICATIONS: Current Outpatient Medications on File Prior to Visit  Medication Sig Dispense Refill  . albuterol (PROVENTIL HFA;VENTOLIN HFA) 108 (90 Base) MCG/ACT inhaler Inhale 2 puffs into the lungs every 6 (six) hours as needed for wheezing or shortness of breath. (Patient taking differently: Inhale 2 puffs into the lungs every 4 (four) hours as needed for wheezing or shortness of breath. ) 1 Inhaler 0  . amLODipine (NORVASC) 5 MG tablet Take 5 mg by mouth every evening.  2  . Buprenorphine HCl (BELBUCA) 300 MCG FILM Place 150 mcg inside cheek 2 (two) times daily.     . busPIRone (BUSPAR) 15 MG tablet Take 1 tablet (15 mg total) by mouth 3 (three) times  daily. 60 tablet 1  . diazepam (VALIUM) 5 MG tablet Take 1 tablet (5 mg total) by mouth 2 (two) times daily as needed for anxiety. 60 tablet 1  . divalproex (DEPAKOTE) 500 MG DR tablet Take 1 tablet (500 mg total) by mouth at bedtime. 90 tablet 3  . EPINEPHrine (EPIPEN) 0.3 mg/0.3 mL SOAJ injection Inject 0.3 mg into the muscle as needed (allergic reaction). Reported on 03/28/2015    . etonogestrel (NEXPLANON) 68 MG IMPL implant 1 each by Subdermal route once.    Marland Kitchen FLUoxetine (PROZAC) 40 MG capsule Take 2 capsules (80 mg total) by mouth daily. 60 capsule 1  . GILENYA 0.5 MG CAPS TAKE 1 CAPSULE BY MOUTH EVERY DAY 30 capsule 0  . ibuprofen (ADVIL,MOTRIN) 600 MG tablet Take 600 mg by mouth daily as needed for moderate pain.    Marland Kitchen lithium carbonate (LITHOBID) 300 MG CR tablet Take 1 tablet (300 mg total)  by mouth at bedtime. 30 tablet 1  . loratadine (CLARITIN) 10 MG tablet Take 10 mg by mouth daily as needed for allergies.    . Multiple Vitamins-Minerals (MULTIVITAMIN ADULT PO) Take 1 tablet by mouth daily.     . OnabotulinumtoxinA (BOTOX IJ) Inject as directed every 3 (three) months.    . phentermine 37.5 MG capsule Take 37.5 mg by mouth every morning.    . pregabalin (LYRICA) 100 MG capsule Take 1 capsule (100 mg total) by mouth 3 (three) times daily. 90 capsule 0  . prochlorperazine (COMPAZINE) 10 MG tablet Take 1 tablet (10 mg total) by mouth every 6 (six) hours as needed for nausea or vomiting. 30 tablet 3  . ranitidine (ZANTAC) 300 MG tablet Take 300 mg by mouth at bedtime.     . topiramate (TOPAMAX) 100 MG tablet Take 1.5 tablets twice a day 270 tablet 3  . zolmitriptan (ZOMIG) 5 MG nasal solution Use nasal spray as prescribed. Do not use more than 3 times a week 6 Units 5  . Calcium Carb-Cholecalciferol (CALCIUM PLUS D3 ABSORBABLE) [phone] MG-UNIT CAPS Take 1 capsule by mouth daily with breakfast. 90 capsule 0  . methocarbamol (ROBAXIN) 750 MG tablet Take 1 tablet (750 mg total) by mouth 2  (two) times daily. 180 tablet 0   Current Facility-Administered Medications on File Prior to Visit  Medication Dose Route Frequency Provider Last Rate Last Dose  . metoCLOPramide (REGLAN) 10 mg in dextrose 5 % 50 mL IVPB  10 mg Intravenous Once Cameron Sprang, MD      . metoCLOPramide (REGLAN) injection 10 mg  10 mg Intravenous Once Milton Ferguson, MD        ALLERGIES: Allergies  Allergen Reactions  . Amitriptyline Hypertension and Other (See Comments)    hypertension  . Baclofen Hives and Shortness Of Breath  . Duloxetine Hcl Shortness Of Breath and Rash  . Gabapentin Shortness Of Breath and Rash  . Hydrocodone-Acetaminophen Hives and Nausea And Vomiting    Projectile vomiting  . Magnesium Salicylate Hives and Itching  . Monosodium Glutamate Anaphylaxis  . Other Shortness Of Breath, Rash and Other (See Comments)    MSG, beans (vomiting) MSG causes hives, itching, throat swelling  . Tizanidine Hives    Other reaction(s): GI Upset (intolerance)  . Alprazolam Other (See Comments)    Lethargy  . Rizatriptan Nausea And Vomiting and Other (See Comments)    GI upset, Projectile vomiting GI upset, Projectile vomiting  . Vicodin [Hydrocodone-Acetaminophen] Hives, Nausea And Vomiting and Other (See Comments)    Projectile vomiting  . Adhesive [Tape] Other (See Comments)    Skin irritation Paper tape is ok  . Ketorolac Tromethamine Nausea Only  . Lamotrigine Rash  . Tramadol Nausea And Vomiting    Other reaction(s): GI Upset (intolerance)    FAMILY HISTORY: Family History  Problem Relation Age of Onset  . Diabetes Mother   . Hypertension Mother   . Diabetes Father   . Hypertension Father   . Arthritis Father   . Cancer Maternal Grandmother   . Cancer Maternal Grandfather   . Alcohol abuse Neg Hx   . Anxiety disorder Neg Hx   . Bipolar disorder Neg Hx   . Drug abuse Neg Hx   . Depression Neg Hx     SOCIAL HISTORY: Social History   Socioeconomic History  . Marital  status: Single    Spouse name: Not on file  . Number of children: 1  . Years  of education: College  . Highest education level: Not on file  Occupational History    Employer: OTHER    Comment: disability  Social Needs  . Financial resource strain: Not on file  . Food insecurity:    Worry: Not on file    Inability: Not on file  . Transportation needs:    Medical: Not on file    Non-medical: Not on file  Tobacco Use  . Smoking status: Former Smoker    Packs/day: 0.25    Years: 10.00    Pack years: 2.50    Types: Cigarettes    Last attempt to quit: 07/12/2015    Years since quitting: 2.3  . Smokeless tobacco: Never Used  Substance and Sexual Activity  . Alcohol use: No    Alcohol/week: 0.0 standard drinks    Comment: Rare  . Drug use: No  . Sexual activity: Yes    Partners: Male    Birth control/protection: None, Implant  Lifestyle  . Physical activity:    Days per week: Not on file    Minutes per session: Not on file  . Stress: Not on file  Relationships  . Social connections:    Talks on phone: Not on file    Gets together: Not on file    Attends religious service: Not on file    Active member of club or organization: Not on file    Attends meetings of clubs or organizations: Not on file    Relationship status: Not on file  . Intimate partner violence:    Fear of current or ex partner: Not on file    Emotionally abused: Not on file    Physically abused: Not on file    Forced sexual activity: Not on file  Other Topics Concern  . Not on file  Social History Narrative   Patient lives at home with fiance in [place]. Born and raised in [place], [place] by parents. Pt has one younger sister. Pt has a Best boy. Pt worked from 1999-2006. She stopped due to MS and is currently on disability. Pt is pregnant with her 1st child. Married for less than 1 yr in 1999 that ended in divorce.             Caffeine Use: 1 20oz soda daily    REVIEW OF  SYSTEMS: Constitutional: No fevers, chills, or sweats, + generalized fatigue, no change in appetite Eyes: as above, no double vision, eye pain Ear, nose and throat: No hearing loss, ear pain, nasal congestion, sore throat Cardiovascular: No chest pain, palpitations Respiratory:  No shortness of breath at rest or with exertion, wheezes GastrointestinaI: + nausea,no vomiting, diarrhea, abdominal pain, fecal incontinence Genitourinary:  No dysuria, urinary retention, +urinary frequency Musculoskeletal:  + neck pain, back pain Integumentary: No rash, pruritus, skin lesions Neurological: as above Psychiatric: + depression, anxiety Endocrine: No palpitations, fatigue, diaphoresis, mood swings, change in appetite, change in weight, increased thirst Hematologic/Lymphatic:  No anemia, purpura, petechiae. Allergic/Immunologic: no itchy/runny eyes, nasal congestion, recent allergic reactions, rashes  PHYSICAL EXAM: Vitals:   12/02/17 1005  BP: 128/84  Pulse: 89  SpO2: 95%   General: No acute distress Head: Normocephalic/atraumatic Eyes: Fundoscopic exam shows bilateral sharp discs, no vessel changes, exudates, or hemorrhages Neck: supple, no paraspinal tenderness, full range of motion Back: No paraspinal tenderness Heart: regular rate and rhythm Lungs: Clear to auscultation bilaterally. Vascular: No carotid bruits. Skin/Extremities: No rash, no edema, wearing bilateral wrist brace Neurological Exam: Mental status:  alert and oriented to person, place, and time, no dysarthria or aphasia, Fund of knowledge is appropriate. Recent and remote memory are intact. Attention and concentration are normal. Able to name objects and repeat phrases. Cranial nerves: CN I: not tested CN II: pupils equal, round and reactive to light,no APD, visual fields intact CN III, IV, VI: full range of motion, no nystagmus, no ptosis CN V: facial sensation intact CN VII: upper and lower face symmetric CN VIII:  hearing intact to finger rub CN IX, X: gag intact, uvula midline CN XI: sternocleidomastoid and trapezius muscles intact CN XII: tongue midline Bulk & Tone: normal, no fasciculations. Motor: 5/5 throughout with no pronator drift. Sensation: intact to light touch Deep Tendon Reflexes: +2 throughout, no ankle clonus, negative Hoffman sign Plantar responses: downgoing bilaterally Cerebellar: no incoordination on finger to nose testing Gait: slightly wide-based, no ataxia Tremor: no significant tremors today  IMPRESSION: This is a 37 yo RH woman with a history of relapsing-remitting multiple sclerosis, migraine with aura, anxiety with panic attacks, conversion disorder with psychogenic seizures. Her MS has been stable, no new symptoms suggestive of MS flare. Continue Gilenya. Surveillance imaging will be ordered on her next visit. She has chronic migraines on Botox, Topamax 150mg  BID and Depakote 500mg  qhs. Migraine cocktail will be given today for her current migraine. She will try Relpax 20mg  as needed for migraine rescue. She continues to report nausea despite several medications, and will be referred to GI. She has not had any psychogenic shaking spells in a long time, continue follow-up with psychiatry and psychotherapy, she continues to deal with significant stress. Continue follow-up with pain management and Neurosurgery for chronic pain. She will follow-up in 6 months and knows to call for any changes.   Thank you for allowing me to participate in her care.  Please do not hesitate to call for any questions or concerns.  The duration of this appointment visit was 25 minutes of face-to-face time with the patient.  Greater than 50% of this time was spent in counseling, explanation of diagnosis, planning of further management, and coordination of care.   Ellouise Newer, M.D.   CC: Dr. Manuella Ghazi

## 2017-12-02 NOTE — Patient Instructions (Addendum)
1. Let's try Relpax 20mg : take 1 tablet as needed for migraine. Do not take more than 2-3 a week  2. Migraine cocktail  3. Continue all your other medications  4. Continue follow-up with Dr. Merlene Laughter and [facility]  5. Follow-up in 6 months, call for any changes
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
## 2017-12-06 ENCOUNTER — Other Ambulatory Visit: Payer: Self-pay | Admitting: Neurology

## 2017-12-08 ENCOUNTER — Telehealth: Payer: Self-pay | Admitting: Neurology

## 2017-12-08 NOTE — Telephone Encounter (Signed)
LOV notes faxed to DR. Bossier at [phone]

## 2017-12-08 NOTE — Telephone Encounter (Signed)
Patient called regarding her seeing Dr. Merlene Laughter for her Headaches. Patient was in tears while on the phone. She is needing to see if we can send his office her records. She said she is in [place] and unable to come by here to sign a release. Please Advise. Thanks

## 2017-12-08 NOTE — Telephone Encounter (Signed)
Patient said the new headache prescription was 100$ when she went to pick it up. Please give her any advice. Call her back at [phone]. Thanks!

## 2017-12-10 DIAGNOSIS — G43711 Chronic migraine without aura, intractable, with status migrainosus: Secondary | ICD-10-CM | POA: Diagnosis not present

## 2017-12-10 DIAGNOSIS — G894 Chronic pain syndrome: Secondary | ICD-10-CM | POA: Diagnosis not present

## 2017-12-10 DIAGNOSIS — Z79891 Long term (current) use of opiate analgesic: Secondary | ICD-10-CM | POA: Diagnosis not present

## 2017-12-10 DIAGNOSIS — M542 Cervicalgia: Secondary | ICD-10-CM | POA: Diagnosis not present

## 2017-12-10 DIAGNOSIS — M545 Low back pain: Secondary | ICD-10-CM | POA: Diagnosis not present

## 2017-12-12 ENCOUNTER — Other Ambulatory Visit (HOSPITAL_COMMUNITY): Payer: Self-pay | Admitting: Psychiatry

## 2017-12-12 ENCOUNTER — Ambulatory Visit: Payer: Self-pay | Admitting: Neurology

## 2017-12-12 DIAGNOSIS — F332 Major depressive disorder, recurrent severe without psychotic features: Secondary | ICD-10-CM

## 2017-12-15 ENCOUNTER — Other Ambulatory Visit (HOSPITAL_COMMUNITY): Payer: Self-pay

## 2017-12-15 DIAGNOSIS — F332 Major depressive disorder, recurrent severe without psychotic features: Secondary | ICD-10-CM

## 2017-12-15 MED ORDER — LITHIUM CARBONATE ER 300 MG PO TBCR: 300.0000 mg | EXTENDED_RELEASE_TABLET | Freq: Every day | ORAL | 0 refills | Status: DC

## 2017-12-20 ENCOUNTER — Other Ambulatory Visit (HOSPITAL_COMMUNITY): Payer: Self-pay | Admitting: Psychiatry

## 2017-12-20 DIAGNOSIS — F4310 Post-traumatic stress disorder, unspecified: Secondary | ICD-10-CM

## 2017-12-20 DIAGNOSIS — F411 Generalized anxiety disorder: Secondary | ICD-10-CM

## 2017-12-20 DIAGNOSIS — F4001 Agoraphobia with panic disorder: Secondary | ICD-10-CM

## 2017-12-24 ENCOUNTER — Other Ambulatory Visit (HOSPITAL_BASED_OUTPATIENT_CLINIC_OR_DEPARTMENT_OTHER): Payer: Self-pay

## 2017-12-24 DIAGNOSIS — G4733 Obstructive sleep apnea (adult) (pediatric): Secondary | ICD-10-CM

## 2017-12-25 ENCOUNTER — Ambulatory Visit (INDEPENDENT_AMBULATORY_CARE_PROVIDER_SITE_OTHER): Payer: Medicare Other | Admitting: Psychiatry

## 2017-12-25 ENCOUNTER — Encounter (HOSPITAL_COMMUNITY): Payer: Self-pay | Admitting: Psychiatry

## 2017-12-25 ENCOUNTER — Other Ambulatory Visit (HOSPITAL_COMMUNITY): Payer: Self-pay

## 2017-12-25 DIAGNOSIS — F4310 Post-traumatic stress disorder, unspecified: Secondary | ICD-10-CM | POA: Diagnosis not present

## 2017-12-25 DIAGNOSIS — F332 Major depressive disorder, recurrent severe without psychotic features: Secondary | ICD-10-CM | POA: Diagnosis not present

## 2017-12-25 DIAGNOSIS — F411 Generalized anxiety disorder: Secondary | ICD-10-CM | POA: Diagnosis not present

## 2017-12-25 DIAGNOSIS — F4001 Agoraphobia with panic disorder: Secondary | ICD-10-CM

## 2017-12-25 DIAGNOSIS — I639 Cerebral infarction, unspecified: Secondary | ICD-10-CM | POA: Diagnosis not present

## 2017-12-25 DIAGNOSIS — F445 Conversion disorder with seizures or convulsions: Secondary | ICD-10-CM

## 2017-12-25 DIAGNOSIS — G43611 Persistent migraine aura with cerebral infarction, intractable, with status migrainosus: Secondary | ICD-10-CM | POA: Diagnosis not present

## 2017-12-25 MED ORDER — LITHIUM CARBONATE ER 450 MG PO TBCR: 450.0000 mg | EXTENDED_RELEASE_TABLET | Freq: Every day | ORAL | 2 refills | Status: DC

## 2017-12-25 MED ORDER — DIAZEPAM 5 MG PO TABS: 5.0000 mg | ORAL_TABLET | Freq: Two times a day (BID) | ORAL | 1 refills | Status: DC | PRN

## 2017-12-25 MED ORDER — FLUOXETINE HCL 40 MG PO CAPS: 80.0000 mg | ORAL_CAPSULE | Freq: Every day | ORAL | 2 refills | Status: DC

## 2017-12-25 MED ORDER — BUSPIRONE HCL 15 MG PO TABS: 15.0000 mg | ORAL_TABLET | Freq: Three times a day (TID) | ORAL | 2 refills | Status: DC
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
## 2017-12-29 ENCOUNTER — Encounter: Payer: Self-pay | Admitting: *Deleted

## 2017-12-29 DIAGNOSIS — E876 Hypokalemia: Secondary | ICD-10-CM | POA: Diagnosis not present

## 2017-12-29 DIAGNOSIS — R4182 Altered mental status, unspecified: Secondary | ICD-10-CM | POA: Diagnosis not present

## 2017-12-29 DIAGNOSIS — R112 Nausea with vomiting, unspecified: Secondary | ICD-10-CM | POA: Diagnosis not present

## 2017-12-29 DIAGNOSIS — R110 Nausea: Secondary | ICD-10-CM | POA: Diagnosis not present

## 2017-12-29 DIAGNOSIS — Z79899 Other long term (current) drug therapy: Secondary | ICD-10-CM | POA: Diagnosis not present

## 2017-12-29 DIAGNOSIS — Z87891 Personal history of nicotine dependence: Secondary | ICD-10-CM | POA: Diagnosis not present

## 2017-12-29 DIAGNOSIS — R197 Diarrhea, unspecified: Secondary | ICD-10-CM | POA: Diagnosis not present

## 2017-12-29 DIAGNOSIS — R1111 Vomiting without nausea: Secondary | ICD-10-CM | POA: Diagnosis not present

## 2017-12-29 NOTE — Progress Notes (Addendum)
Sent request for Botox prior auth 12/29/17 via covermymeds [id number] received following response: Information regarding your request  The member does not have coverage for the requested healthcare service. Called [facility] call center [phone] cal# [phone] and they verified she has Part D coverage with medicare and that they only cover recipients who do not have Part D. Pt appears to have Medicare so no prior auth is required for Botox

## 2018-01-05 ENCOUNTER — Telehealth (HOSPITAL_COMMUNITY): Payer: Self-pay

## 2018-01-05 DIAGNOSIS — W06XXXA Fall from bed, initial encounter: Secondary | ICD-10-CM | POA: Diagnosis not present

## 2018-01-05 DIAGNOSIS — M25539 Pain in unspecified wrist: Secondary | ICD-10-CM | POA: Diagnosis not present

## 2018-01-05 DIAGNOSIS — W19XXXA Unspecified fall, initial encounter: Secondary | ICD-10-CM | POA: Diagnosis not present

## 2018-01-05 DIAGNOSIS — M25551 Pain in right hip: Secondary | ICD-10-CM | POA: Diagnosis not present

## 2018-01-05 DIAGNOSIS — I10 Essential (primary) hypertension: Secondary | ICD-10-CM | POA: Diagnosis not present

## 2018-01-05 DIAGNOSIS — S8001XA Contusion of right knee, initial encounter: Secondary | ICD-10-CM | POA: Diagnosis not present

## 2018-01-05 DIAGNOSIS — F329 Major depressive disorder, single episode, unspecified: Secondary | ICD-10-CM | POA: Diagnosis not present

## 2018-01-05 DIAGNOSIS — Z87891 Personal history of nicotine dependence: Secondary | ICD-10-CM | POA: Diagnosis not present

## 2018-01-05 DIAGNOSIS — R0902 Hypoxemia: Secondary | ICD-10-CM | POA: Diagnosis not present

## 2018-01-05 DIAGNOSIS — S79911A Unspecified injury of right hip, initial encounter: Secondary | ICD-10-CM | POA: Diagnosis not present

## 2018-01-05 DIAGNOSIS — S8991XA Unspecified injury of right lower leg, initial encounter: Secondary | ICD-10-CM | POA: Diagnosis not present

## 2018-01-05 DIAGNOSIS — R52 Pain, unspecified: Secondary | ICD-10-CM | POA: Diagnosis not present

## 2018-01-05 DIAGNOSIS — Z79899 Other long term (current) drug therapy: Secondary | ICD-10-CM | POA: Diagnosis not present

## 2018-01-05 DIAGNOSIS — M25561 Pain in right knee: Secondary | ICD-10-CM | POA: Diagnosis not present

## 2018-01-05 NOTE — Telephone Encounter (Signed)
Patient is calling to let you know that she had a fall last night, she states she is supposed to inform all providers.

## 2018-01-09 ENCOUNTER — Ambulatory Visit: Payer: Self-pay | Admitting: Neurology

## 2018-01-09 DIAGNOSIS — L723 Sebaceous cyst: Secondary | ICD-10-CM | POA: Diagnosis not present

## 2018-01-09 DIAGNOSIS — Z299 Encounter for prophylactic measures, unspecified: Secondary | ICD-10-CM | POA: Diagnosis not present

## 2018-01-09 DIAGNOSIS — R110 Nausea: Secondary | ICD-10-CM | POA: Diagnosis not present

## 2018-01-09 DIAGNOSIS — I10 Essential (primary) hypertension: Secondary | ICD-10-CM | POA: Diagnosis not present

## 2018-01-09 DIAGNOSIS — Z6841 Body Mass Index (BMI) 40.0 and over, adult: Secondary | ICD-10-CM | POA: Diagnosis not present

## 2018-01-13 ENCOUNTER — Ambulatory Visit: Payer: Medicare Other | Attending: Neurology | Admitting: Neurology

## 2018-01-13 DIAGNOSIS — Z79899 Other long term (current) drug therapy: Secondary | ICD-10-CM | POA: Insufficient documentation

## 2018-01-13 DIAGNOSIS — G4733 Obstructive sleep apnea (adult) (pediatric): Secondary | ICD-10-CM | POA: Insufficient documentation

## 2018-01-14 ENCOUNTER — Encounter: Payer: Self-pay | Admitting: Internal Medicine

## 2018-01-15 NOTE — Procedures (Signed)
[facility] A. Merlene Laughter, MD     [email or web address]             NOCTURNAL POLYSOMNOGRAPHY   LOCATION: [facility]   Patient Name: Emily [name], Emily [name] Study Date: 01/13/2018 Gender: Female D.O.B: [birth year] Age (years): 37 Referring Provider: Phillips Odor MD, ABSM Height (inches): 64 Interpreting Physician: Phillips Odor MD, ABSM Weight (lbs): 265 RPSGT: Rosebud Poles BMI: 45 MRN: [id number] Neck Size: 18.50 CLINICAL INFORMATION Sleep Study Type: NPSG     Indication for sleep study: N/A     Epworth Sleepiness Score: 4     SLEEP STUDY TECHNIQUE As per the AASM Manual for the Scoring of Sleep and Associated Events v2.3 (April 2016) with a hypopnea requiring 4% desaturations.  The channels recorded and monitored were frontal, central and occipital EEG, electrooculogram (EOG), submentalis EMG (chin), nasal and oral airflow, thoracic and abdominal wall motion, anterior tibialis EMG, snore microphone, electrocardiogram, and pulse oximetry.  MEDICATIONS Medications self-administered by patient taken the night of the study : N/A  Current Outpatient Medications:  .  albuterol (PROVENTIL HFA;VENTOLIN HFA) 108 (90 Base) MCG/ACT inhaler, Inhale 2 puffs into the lungs every 6 (six) hours as needed for wheezing or shortness of breath. (Patient taking differently: Inhale 2 puffs into the lungs every 4 (four) hours as needed for wheezing or shortness of breath. ), Disp: 1 Inhaler, Rfl: 0 .  amLODipine (NORVASC) 5 MG tablet, Take 5 mg by mouth every evening., Disp: , Rfl: 2 .  Buprenorphine HCl (BELBUCA) 300 MCG FILM, Place 150 mcg inside cheek 2 (two) times daily. , Disp: , Rfl:  .  busPIRone (BUSPAR) 15 MG tablet, Take 1 tablet (15 mg total) by mouth 3 (three) times daily., Disp: 90 tablet, Rfl: 2 .  diazepam (VALIUM) 5 MG tablet, Take 1 tablet (5 mg total) by mouth 2 (two) times daily as needed for anxiety., Disp: 60 tablet, Rfl: 1 .  divalproex (DEPAKOTE) 500 MG DR  tablet, Take 1 tablet (500 mg total) by mouth at bedtime., Disp: 90 tablet, Rfl: 3 .  EPINEPHrine (EPIPEN) 0.3 mg/0.3 mL SOAJ injection, Inject 0.3 mg into the muscle as needed (allergic reaction). Reported on 03/28/2015, Disp: , Rfl:  .  etonogestrel (NEXPLANON) 68 MG IMPL implant, 1 each by Subdermal route once., Disp: , Rfl:  .  Fingolimod HCl (GILENYA) 0.5 MG CAPS, Take 1 capsule (0.5 mg total) by mouth daily., Disp: 30 capsule, Rfl: 11 .  FLUoxetine (PROZAC) 40 MG capsule, Take 2 capsules (80 mg total) by mouth daily., Disp: 60 capsule, Rfl: 2 .  ibuprofen (ADVIL,MOTRIN) 600 MG tablet, Take 600 mg by mouth daily as needed for moderate pain., Disp: , Rfl:  .  lithium carbonate (ESKALITH) 450 MG CR tablet, Take 1 tablet (450 mg total) by mouth at bedtime., Disp: 30 tablet, Rfl: 2 .  loratadine (CLARITIN) 10 MG tablet, Take 10 mg by mouth daily as needed for allergies., Disp: , Rfl:  .  methocarbamol (ROBAXIN) 750 MG tablet, Take 1 tablet (750 mg total) by mouth 2 (two) times daily., Disp: 180 tablet, Rfl: 0 .  methocarbamol (ROBAXIN) 750 MG tablet, Take 750 mg by mouth 2 (two) times daily., Disp: , Rfl: 0 .  Multiple Vitamins-Minerals (MULTIVITAMIN ADULT PO), Take 1 tablet by mouth daily. , Disp: , Rfl:  .  OnabotulinumtoxinA (BOTOX IJ), Inject as directed every 3 (three) months., Disp: , Rfl:  .  phentermine 37.5 MG capsule, Take 37.5 mg by mouth every morning., Disp: , Rfl:  .  pregabalin (LYRICA) 100 MG capsule, Take 1 capsule (100 mg total) by mouth 3 (three) times daily., Disp: 90 capsule, Rfl: 0 .  prochlorperazine (COMPAZINE) 10 MG tablet, Take 1 tablet (10 mg total) by mouth every 6 (six) hours as needed for nausea or vomiting., Disp: 30 tablet, Rfl: 3 .  ranitidine (ZANTAC) 300 MG tablet, Take 300 mg by mouth at bedtime. , Disp: , Rfl:  .  topiramate (TOPAMAX) 100 MG tablet, Take 1.5 tablets twice a day, Disp: 270 tablet, Rfl: 3  Current Facility-Administered Medications:  .   metoCLOPramide (REGLAN) 10 mg in dextrose 5 % 50 mL IVPB, 10 mg, Intravenous, Once, Delice Lesch, Lezlie Octave, MD  Facility-Administered Medications Ordered in Other Visits:  .  metoCLOPramide (REGLAN) injection 10 mg, 10 mg, Intravenous, Once, Milton Ferguson, MD     SLEEP ARCHITECTURE The study was initiated at 10:10:29 PM and ended at 4:44:31 AM.  Sleep onset time was 28.7 minutes and the sleep efficiency was 85.1%%. The total sleep time was 335.3 minutes.  Stage REM latency was 249.0 minutes.  The patient spent 2.5%% of the night in stage N1 sleep, 86.4%% in stage N2 sleep, 6.1%% in stage N3 and 4.9% in REM.  Alpha intrusion was absent.  Supine sleep was 0.00%.  RESPIRATORY PARAMETERS The overall apnea/hypopnea index (AHI) was 0.0 per hour. There were 0 total apneas, including 0 obstructive, 0 central and 0 mixed apneas. There were 0 hypopneas and 0 RERAs.  The AHI during Stage REM sleep was 0.0 per hour.  AHI while supine was N/A per hour.  The mean oxygen saturation was 94.2%. The minimum SpO2 during sleep was 91.0%.  none snoring was noted during this study.  CARDIAC DATA The 2 lead EKG demonstrated sinus rhythm. The mean heart rate was 70.4 beats per minute. Other EKG findings include: None.  LEG MOVEMENT DATA The total PLMS were 0 with a resulting PLMS index of 0.0. Associated arousal with leg movement index was 0.0.  IMPRESSIONS No significant obstructive sleep apnea occurred during this study. No significant central sleep apnea occurred during this study. The patient had minimal or no oxygen desaturation. No snoring was audible during this study.  Delano Metz, MD Diplomate, American Board of Sleep Medicine. ELECTRONICALLY SIGNED ON:  01/15/2018, 4:45 PM [facility] PH: [phone]   FX: [phone] [place]

## 2018-01-17 ENCOUNTER — Other Ambulatory Visit (HOSPITAL_COMMUNITY): Payer: Self-pay | Admitting: Psychiatry

## 2018-01-17 DIAGNOSIS — F445 Conversion disorder with seizures or convulsions: Secondary | ICD-10-CM

## 2018-01-17 DIAGNOSIS — F332 Major depressive disorder, recurrent severe without psychotic features: Secondary | ICD-10-CM

## 2018-01-17 DIAGNOSIS — F4310 Post-traumatic stress disorder, unspecified: Secondary | ICD-10-CM

## 2018-01-26 DIAGNOSIS — M79642 Pain in left hand: Secondary | ICD-10-CM | POA: Diagnosis not present

## 2018-01-26 DIAGNOSIS — M150 Primary generalized (osteo)arthritis: Secondary | ICD-10-CM | POA: Diagnosis not present

## 2018-01-26 DIAGNOSIS — F329 Major depressive disorder, single episode, unspecified: Secondary | ICD-10-CM | POA: Diagnosis not present

## 2018-01-26 DIAGNOSIS — Z79899 Other long term (current) drug therapy: Secondary | ICD-10-CM | POA: Diagnosis not present

## 2018-01-26 DIAGNOSIS — M25551 Pain in right hip: Secondary | ICD-10-CM | POA: Diagnosis not present

## 2018-01-26 DIAGNOSIS — G35 Multiple sclerosis: Secondary | ICD-10-CM | POA: Diagnosis not present

## 2018-01-26 DIAGNOSIS — Z888 Allergy status to other drugs, medicaments and biological substances status: Secondary | ICD-10-CM | POA: Diagnosis not present

## 2018-01-26 DIAGNOSIS — Z87891 Personal history of nicotine dependence: Secondary | ICD-10-CM | POA: Diagnosis not present

## 2018-01-26 DIAGNOSIS — M25561 Pain in right knee: Secondary | ICD-10-CM | POA: Diagnosis not present

## 2018-01-26 DIAGNOSIS — F419 Anxiety disorder, unspecified: Secondary | ICD-10-CM | POA: Diagnosis not present

## 2018-01-26 DIAGNOSIS — M79641 Pain in right hand: Secondary | ICD-10-CM | POA: Diagnosis not present

## 2018-01-27 ENCOUNTER — Other Ambulatory Visit: Payer: Self-pay | Admitting: Neurology

## 2018-01-27 DIAGNOSIS — G35 Multiple sclerosis: Secondary | ICD-10-CM

## 2018-02-04 DIAGNOSIS — G4714 Hypersomnia due to medical condition: Secondary | ICD-10-CM | POA: Diagnosis not present

## 2018-02-04 DIAGNOSIS — G43711 Chronic migraine without aura, intractable, with status migrainosus: Secondary | ICD-10-CM | POA: Diagnosis not present

## 2018-02-04 DIAGNOSIS — M545 Low back pain: Secondary | ICD-10-CM | POA: Diagnosis not present

## 2018-02-09 DIAGNOSIS — K649 Unspecified hemorrhoids: Secondary | ICD-10-CM | POA: Diagnosis not present

## 2018-02-11 DIAGNOSIS — G894 Chronic pain syndrome: Secondary | ICD-10-CM | POA: Diagnosis not present

## 2018-02-12 ENCOUNTER — Encounter (HOSPITAL_COMMUNITY): Payer: Self-pay | Admitting: Psychiatry

## 2018-02-12 ENCOUNTER — Ambulatory Visit (HOSPITAL_COMMUNITY): Payer: Medicare Other | Admitting: Psychiatry

## 2018-02-12 VITALS — BP 122/80 | Ht 64.0 in | Wt 254.0 lb

## 2018-02-12 DIAGNOSIS — F411 Generalized anxiety disorder: Secondary | ICD-10-CM

## 2018-02-12 DIAGNOSIS — F332 Major depressive disorder, recurrent severe without psychotic features: Secondary | ICD-10-CM

## 2018-02-12 DIAGNOSIS — F4001 Agoraphobia with panic disorder: Secondary | ICD-10-CM

## 2018-02-12 DIAGNOSIS — F4310 Post-traumatic stress disorder, unspecified: Secondary | ICD-10-CM

## 2018-02-12 DIAGNOSIS — F445 Conversion disorder with seizures or convulsions: Secondary | ICD-10-CM

## 2018-02-12 MED ORDER — DIAZEPAM 5 MG PO TABS: 5.0000 mg | ORAL_TABLET | Freq: Two times a day (BID) | ORAL | 1 refills | Status: DC | PRN

## 2018-02-12 MED ORDER — LITHIUM CARBONATE ER 300 MG PO TBCR: 600.0000 mg | EXTENDED_RELEASE_TABLET | Freq: Every day | ORAL | 1 refills | Status: DC

## 2018-02-12 MED ORDER — BUSPIRONE HCL 15 MG PO TABS: 15.0000 mg | ORAL_TABLET | Freq: Three times a day (TID) | ORAL | 1 refills | Status: DC

## 2018-02-12 MED ORDER — FLUOXETINE HCL 40 MG PO CAPS: 80.0000 mg | ORAL_CAPSULE | Freq: Every day | ORAL | 2 refills | Status: DC
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
## 2018-02-18 ENCOUNTER — Telehealth: Payer: Self-pay | Admitting: Neurology

## 2018-02-18 DIAGNOSIS — G43709 Chronic migraine without aura, not intractable, without status migrainosus: Secondary | ICD-10-CM

## 2018-02-18 DIAGNOSIS — M25562 Pain in left knee: Secondary | ICD-10-CM

## 2018-02-18 DIAGNOSIS — M25561 Pain in right knee: Secondary | ICD-10-CM | POA: Diagnosis not present

## 2018-02-18 DIAGNOSIS — I639 Cerebral infarction, unspecified: Secondary | ICD-10-CM

## 2018-02-18 DIAGNOSIS — G8929 Other chronic pain: Secondary | ICD-10-CM | POA: Insufficient documentation

## 2018-02-18 DIAGNOSIS — G43611 Persistent migraine aura with cerebral infarction, intractable, with status migrainosus: Secondary | ICD-10-CM

## 2018-02-18 DIAGNOSIS — G894 Chronic pain syndrome: Secondary | ICD-10-CM

## 2018-02-18 NOTE — Telephone Encounter (Addendum)
Pt states she cannot come on Friday's for Botox. Pt wants to know if provider can recommend something else. Pt states wants referred to pain clinic. Call pt [phone]. Pt has appt Delice Lesch 07/20/2018.

## 2018-02-18 NOTE — Telephone Encounter (Signed)
Will place pain clinic referral.  pls advise on migraine medication.  Thanks!

## 2018-02-20 NOTE — Telephone Encounter (Signed)
Is she meaning rescue migraine medication or something to replace the Botox? It looks like the Relpax was not covered by insurance, is this what she meant? Thanks

## 2018-02-24 ENCOUNTER — Telehealth: Payer: Self-pay | Admitting: Neurology

## 2018-02-24 NOTE — Telephone Encounter (Signed)
Patient is calling about CPS, medication, opioids, and some situation with falling asleep. And please call her at [phone]. Thanks!

## 2018-02-24 NOTE — Telephone Encounter (Signed)
Spoke with Emily [name].   Emily [name] states that Emily [name] was prescribed Bellbuca 150mg  film BID by Dr. Merlene Laughter.  States that Emily [name] has been nodding off since starting this - one time during her daughter's speech therapy.  States that the speech therapist told her that her daughter could be taken from her if it was known that Emily [name] is nodding off while supervising her daughter.  Emily [name] states that Emily [name] has not taken it since then.  Goes on to state that Dr. Freddie Apley office made her feel like Emily [name] is a heroin addict.  Emily [name] would like an appointment with Dr. Delice Lesch to go over further options, even though Emily [name] states that Emily [name] knows Emily [name] "has reached the end of the road with what Dr. Delice Lesch can offer"  I advised that I would return call with any recommendations.

## 2018-03-02 ENCOUNTER — Telehealth: Payer: Self-pay | Admitting: Neurology

## 2018-03-02 NOTE — Telephone Encounter (Signed)
Emily [name] called back regarding CBD oil and wanting to know if Dr. Delice Lesch can give her a Rx for it and help monitor her on it? Please Call. Thanks

## 2018-03-02 NOTE — Telephone Encounter (Signed)
Called home number listed for pt.  Pt states that she was advised by one of her daughters therapists to request CBD tincture from Dr. Delice Lesch.  I advised that Dr. Delice Lesch does not prescribe CBD oil and that it is readily available OTC in many forms.  Pt states that the CBD oil she would like does contain a trace amount of TCH (.09% or .05% pt could not remember)  Pt went on to state that it was a therapist and "not a random person on the street or friend" that recommended the CBD oil.  I advised that it does not matter who takes it, it does not change the fact the Dr. Delice Lesch does not, under any circumstance, prescribe CBD oil.  Pt goes on to state that [place] prescribed Modafinil after pt complained about nodding off while taking Belbuca.  States that she experienced 3 seizures within 2 days of taking Modafinil and stopped. Pt looked up the medication and found that it is prescribed "to soldiers so that they can stay awake for days at a time".    Pt states that she would like to speak with Dr. Delice Lesch about possible migraine injections.  States that Aimovig is too expensive, but she saw an add in a magazine about another injection (does not remember which one).  Feels that there are "more avenues available" that she would like to discuss.  Pt asked if Dr. Delice Lesch would like to see her in office to discuss options.   I advised that Dr. Delice Lesch had already left the office for the day and that I will return call with her recommendations tomorrow.

## 2018-03-02 NOTE — Telephone Encounter (Signed)
Patient called and she is very concerned regarding her Pain and Nausea. She said Dr. Merlene Laughter had prescribed her Emily [name]. She said she had to stop the medication because it made her have Narcoleptic episodes. She passed out while her daughters therapist was at her home. She said that she is needing to get her pain and Nausea under control. She was asking about CBD Oil with a %. She said only a Neurologist can prescribed the medication. Please Call. Thanks

## 2018-03-02 NOTE — Telephone Encounter (Signed)
Returned call to number provided below.  Received message that telephone number was disconnected.

## 2018-03-03 ENCOUNTER — Telehealth: Payer: Self-pay | Admitting: Pain Medicine

## 2018-03-03 NOTE — Telephone Encounter (Signed)
Pt called and stated that she is taking CBD oil and asked if she should stop before her appt.

## 2018-03-03 NOTE — Telephone Encounter (Signed)
Patient states she is coming for an appointment and will bring her CBD oil bottle with her when she comes.

## 2018-03-04 NOTE — Telephone Encounter (Signed)
Patient also said she has discharged herself from Dr.[place]oon[place]ah's office.Marland KitchenMarland KitchenMarland Kitchen

## 2018-03-04 NOTE — Telephone Encounter (Signed)
Ok for cocktail tomorrow, pls let her know message below. Thanks

## 2018-03-04 NOTE — Telephone Encounter (Signed)
Spoke with pt advising her of message below.  She states that she wishes to continue Botox treatments with Dr. Tomi Likens.  No Rx sent at this time. Pt states that she will contact office to schedule Botox injections.  Pt states that she will have a driver tomorrow for headache cocktail.

## 2018-03-04 NOTE — Telephone Encounter (Signed)
Patient is wanting to come in for a headache injection tomorrow while she is near here. Please call her and let her know if she can come. Thanks!

## 2018-03-04 NOTE — Telephone Encounter (Signed)
The injections are Aimovig, Emgality, and Ajovy. If she is on one of those, she cannot be on Botox at the same time. We will have to send an order for either Emgality and Ajovy and see which will be approved by her insurance. It is our office policy that we do not prescribe CBD or THC.

## 2018-03-04 NOTE — Telephone Encounter (Signed)
OK for cocktail tomorrow?

## 2018-03-05 ENCOUNTER — Telehealth: Payer: Self-pay | Admitting: Pain Medicine

## 2018-03-05 ENCOUNTER — Ambulatory Visit: Payer: Medicare Other

## 2018-03-05 NOTE — Telephone Encounter (Signed)
FYI ----Patient states she turned in the [name] to physician that wrote it for her. They would not give her a note stating that it was turned in but would write this in her chart and would not give her copy of her records. She signed a release of information but they would not send records to our clinic. They want [place] to send a request to them.

## 2018-03-15 ENCOUNTER — Other Ambulatory Visit (HOSPITAL_COMMUNITY): Payer: Self-pay | Admitting: Psychiatry

## 2018-03-15 DIAGNOSIS — F332 Major depressive disorder, recurrent severe without psychotic features: Secondary | ICD-10-CM

## 2018-03-15 NOTE — Progress Notes (Signed)
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 moderate stenosis which appears increased since 2015. There is mild to moderate contralateral right neural foraminal stenosis. C7-T1: Mild endplate spurring. Mild to moderate facet hypertrophy greater on the left. This level has progressed since 2015 but there is no significant stenosis.  No upper thoracic spinal stenosis.  IMPRESSION: 1. Chronic but progressed C6-7 disc and endplate degeneration. Patchy C6 vertebral body marrow  edema is new and appears degenerative in nature. Leftward disc herniation appears increased, with associated spinal stenosis, spinal cord mass effect, and moderate bilateral neural foraminal stenosis. Query left C7 radiculitis. 2. Advanced chronic disc and endplate degeneration also at C5-6, but stable to mildly improved since 2015. Chronic mild spinal stenosis and spinal cord mass effect at that level. 3. Subtle chronic upper cervical spinal cord signal abnormality compatible with chronic multiple sclerosis. Questionable small upper thoracic cord lesion at T2-3.  Electronically Signed   By: Genevie Ann M.D.   On: 03/06/2017 10:22    Chronic Pain of Both Knees   Last Assessment & Plan:  Treatment: 1.  No further work-up required of her right knee with complete resolution of her symptoms at this time  2.  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  With regards to her diagnosis of osteoporosis I recommend she discuss this with her PCP to get the appropriate bone density performed to address this diagnosis  4.  Recommend she discuss changing pain management physicians via her multiple sclerosis neurologist to assist her with chronic pains to the extremities at this time.  No further orthopedic work-up required   Cervical facet hypertrophy   Levels: C2-3: Chronic moderate to severe facet hypertrophy greater on the left. C3-4: Moderate bilateral facet hypertrophy. C4-5: Mild facet hypertrophy. C7-T1: Mild to moderate facet hypertrophy greater on the left.   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 C6-7: A broad-based leftward disc protrusion is chronic but appears increased from the previous MRIs, best seen on series 5, image 26. Spinal stenosis with left hemi cord mass effect occurs.   Cervical foraminal stenosis (Bilateral) (C6-7)   Levels: C2-3: Mild left C3 foraminal stenosis  C5-6: Mild left C6 foraminal stenosis is stable. C6-7: Disc material also involves the proximal left C7 neural foramen with at least moderate stenosis which appears increased since 2015. There is mild to moderate contralateral right neural foraminal stenosis.  IMPRESSION: 1. Chronic but progressed C6-7 disc and endplate degeneration. Patchy C6 vertebral body marrow edema is new and appears degenerative in nature. Leftward disc herniation appears increased, with associated spinal stenosis, spinal cord mass effect, and moderate bilateral neural foraminal stenosis. Query left C7 radiculitis.   Chronic CNS demyelinating disease (MS)   Hazy T2 hyperintensity within the spinal cord posterior-centrally from  C2-3 to C4-5. Consistent with chronic demyelinating disease. No abnormal enhancing lesions. (2015 C-Spine MRI)    Chronic low back pain (Secondary Area of Pain) (Bilateral) (L>R)  Multiple Sclerosis (Hcc)   Hazy T2 hyperintensity within the spinal cord posterior-centrally from  C2-3 to C4-5. Consistent with chronic demyelinating disease. No abnormal enhancing lesions.   Pharmacologic Therapy  Disorder of Skeletal System  Problems Influencing Health Status  Morbid Obesity With Bmi of 45.0-49.9, Adult (Hcc)    Plan of Care  Pharmacotherapy (Medications Ordered): No orders of the defined types were placed in this encounter.  Medications administered today: Nyx L. Krawiec had no medications administered during this visit.  Orders:  Orders Placed This Encounter  Procedures  . KNEE INJECTION    Local Anesthetic & Steroid injection.    Standing Status:   Standing    Number of Occurrences:   2    Standing Expiration Date:   09/16/2019    Scheduling Instructions:  Side: Bilateral     Sedation: None     Timeframe: As soon as schedule allows    Order Specific Question:   Where will this procedure be performed?    Answer:   [facility]  . LUMBAR FACET(MEDIAL BRANCH NERVE BLOCK) MBNB    Standing Status:   Future    Standing Expiration Date:   04/16/2018    Scheduling Instructions:     Side: Bilateral     Level: L3-4, L4-5, & L5-S1 Facets (L2, L3, L4, L5, & S1 Medial Branch Nerves)     Sedation: Patient's choice.     Timeframe: ASAA    Order Specific Question:   Where will this procedure be performed?    Answer:   [facility]  . DG Knee 1-2 Views Right    Standing Status:   Future    Standing Expiration Date:   03/16/2019    Order Specific Question:   Reason for Exam (SYMPTOM  OR DIAGNOSIS REQUIRED)    Answer:   Right knee pain/arthralgia    Order Specific Question:   Is the patient pregnant?    Answer:   No    Order Specific Question:   Preferred imaging location?    Answer:   [facility]    Order Specific Question:   Call Results- Best Contact Number?    Answer:    [phone] (Pain Clinic facility) (Dr. Dossie Arbour)  . DG Knee 1-2 Views Left    Standing Status:   Future    Standing Expiration Date:   03/16/2019    Order Specific Question:   Reason for Exam (SYMPTOM  OR DIAGNOSIS REQUIRED)    Answer:   Right knee pain/arthralgia    Order Specific Question:   Is the patient pregnant?    Answer:   No    Order Specific Question:   Preferred imaging location?    Answer:   [facility]    Order Specific Question:   Call Results- Best Contact Number?    Answer:   [phone] (Pain Clinic facility) (Dr. Dossie Arbour)  . Compliance Drug Analysis, Ur    Volume: 30 ml(s). Minimum 3 ml of urine is needed. Document temperature of fresh sample. Indications: Long term (current) use of opiate analgesic (Z79.891) Test#: [id number] (Comprehensive Profile)  . Comp. Metabolic Panel (12)    With GFR. Indications: Chronic Pain Syndrome (G89.4) & Pharmacotherapy (Y65.035)    Order Specific Question:   Has the patient fasted?    Answer:   No    Order Specific Question:   CC Results    Answer:   PCP-NURSE [[id number]]  . Magnesium    Indication: Pharmacologic therapy (E75.170)    Order Specific Question:   CC Results    Answer:   PCP-NURSE [[id number]]  . Vitamin B12    Indication: Pharmacologic therapy (W96.759).    Order Specific Question:   CC Results    Answer:   PCP-NURSE [[id number]]  . Sedimentation rate    Indication: Disorder of skeletal system (M89.9)    Order Specific Question:   CC Results    Answer:   PCP-NURSE [[id number]]  . 25-Hydroxyvitamin D Lcms D2+D3    Indication: Disorder of skeletal system (M89.9).    Order Specific Question:   CC Results    Answer:   PCP-NURSE [[id number]]  . C-reactive protein    Indication: Problems influencing health status (Z78.9)    Order Specific Question:   CC Results    Answer:  PCP-NURSE [id number]  . Amb Ref to Medical Weight Management    Referral Priority:   Routine    Referral Type:   Consultation    Referral Reason:    Specialty Services Required    Number of Visits Requested:   1  . Amb Referral to Bariatric Surgery    Referral Priority:   Routine    Referral Type:   Consultation    Referral Reason:   Specialty Services Required    Number of Visits Requested:   1  . Nursing communication    Scheduling Instructions:     Complete the opioid risk tool (ORT) questionnaire.    Lab Orders     Compliance Drug Analysis, Ur     Comp. Metabolic Panel (12)     Magnesium     Vitamin B12     Sedimentation rate     25-Hydroxyvitamin D Lcms D2+D3     C-reactive protein  Imaging Orders     DG Knee 1-2 Views Right     DG Knee 1-2 Views Left  Referral Orders     Amb Ref to Medical Weight Management     Amb Referral to Bariatric Surgery Interventional management options: Planned follow-up:   Plan: Return for Procedure (w/ sedation): (B) L-FCT BLK #1. Diagnostic bilateral cervical facet block #2 (NO STEROIDS) under fluoroscopic guidance and IV sedation    Considering:   Possiblebilateral thoracic epidural steroid injection Possiblebilateral thoracic facet injections Possiblebilateral thoracic radiofrequency ablation Possible right sided lumbar epidural steroid injection Possibleright-sided lumbar facet injection Possibleright-sided lumbar radiofrequency ablation Possibleleft intra-articular knee injection Possibleleft knee genicular nerve block Possibleleft knee radiofrequency ablation  Possibleright-sided cervical epidural steroid injection possibleright-sided cervical facet block Possibleright-sided cervical radiofrequency ablation Possibleright occipital nerve block   Palliative PRN treatment(s):   None at this time   Future Appointments  Date Time Provider [place]  03/23/2018  9:00 AM Carlis Stable, NP [facility]  03/24/2018  9:45 AM Milinda Pointer, MD [facility] None  03/27/2018 10:30 AM Pieter Partridge, DO [facility] None  04/16/2018  1:00 PM Charlcie Cradle, MD  [facility] None  07/20/2018 10:00 AM Cameron Sprang, MD [facility] None  07/31/2018  8:30 AM Kirtland Bouchard, PhD [facility] None  08/11/2018  1:30 PM Kirtland Bouchard, PhD [facility] None   Primary Care Physician: Monico Blitz, MD Location: [facility] Note by: Gaspar Cola, MD Date: 03/16/2018; Time: 3:43 PM

## 2018-03-16 ENCOUNTER — Encounter: Payer: Self-pay | Admitting: Pain Medicine

## 2018-03-16 ENCOUNTER — Ambulatory Visit: Payer: Self-pay | Admitting: Surgery

## 2018-03-16 ENCOUNTER — Ambulatory Visit: Payer: Medicare Other | Attending: Pain Medicine | Admitting: Pain Medicine

## 2018-03-16 ENCOUNTER — Other Ambulatory Visit: Payer: Self-pay

## 2018-03-16 VITALS — BP 128/70 | HR 84 | Temp 98.7°F | Resp 18 | Ht 64.0 in | Wt 250.0 lb

## 2018-03-16 DIAGNOSIS — M899 Disorder of bone, unspecified: Secondary | ICD-10-CM

## 2018-03-16 DIAGNOSIS — M5442 Lumbago with sciatica, left side: Secondary | ICD-10-CM | POA: Diagnosis not present

## 2018-03-16 DIAGNOSIS — M79601 Pain in right arm: Secondary | ICD-10-CM | POA: Insufficient documentation

## 2018-03-16 DIAGNOSIS — M4802 Spinal stenosis, cervical region: Secondary | ICD-10-CM

## 2018-03-16 DIAGNOSIS — M79675 Pain in left toe(s): Secondary | ICD-10-CM | POA: Diagnosis not present

## 2018-03-16 DIAGNOSIS — M47812 Spondylosis without myelopathy or radiculopathy, cervical region: Secondary | ICD-10-CM | POA: Diagnosis not present

## 2018-03-16 DIAGNOSIS — M25561 Pain in right knee: Secondary | ICD-10-CM

## 2018-03-16 DIAGNOSIS — M549 Dorsalgia, unspecified: Secondary | ICD-10-CM | POA: Insufficient documentation

## 2018-03-16 DIAGNOSIS — M5441 Lumbago with sciatica, right side: Secondary | ICD-10-CM | POA: Diagnosis not present

## 2018-03-16 DIAGNOSIS — G8929 Other chronic pain: Secondary | ICD-10-CM | POA: Insufficient documentation

## 2018-03-16 DIAGNOSIS — R937 Abnormal findings on diagnostic imaging of other parts of musculoskeletal system: Secondary | ICD-10-CM | POA: Insufficient documentation

## 2018-03-16 DIAGNOSIS — M79674 Pain in right toe(s): Secondary | ICD-10-CM | POA: Insufficient documentation

## 2018-03-16 DIAGNOSIS — G379 Demyelinating disease of central nervous system, unspecified: Secondary | ICD-10-CM | POA: Diagnosis not present

## 2018-03-16 DIAGNOSIS — G43611 Persistent migraine aura with cerebral infarction, intractable, with status migrainosus: Secondary | ICD-10-CM

## 2018-03-16 DIAGNOSIS — Z6841 Body Mass Index (BMI) 40.0 and over, adult: Secondary | ICD-10-CM | POA: Diagnosis not present

## 2018-03-16 DIAGNOSIS — G894 Chronic pain syndrome: Secondary | ICD-10-CM | POA: Insufficient documentation

## 2018-03-16 DIAGNOSIS — M79641 Pain in right hand: Secondary | ICD-10-CM | POA: Diagnosis not present

## 2018-03-16 DIAGNOSIS — G5603 Carpal tunnel syndrome, bilateral upper limbs: Secondary | ICD-10-CM

## 2018-03-16 DIAGNOSIS — M47816 Spondylosis without myelopathy or radiculopathy, lumbar region: Secondary | ICD-10-CM | POA: Insufficient documentation

## 2018-03-16 DIAGNOSIS — Z79899 Other long term (current) drug therapy: Secondary | ICD-10-CM | POA: Diagnosis not present

## 2018-03-16 DIAGNOSIS — M79642 Pain in left hand: Secondary | ICD-10-CM | POA: Insufficient documentation

## 2018-03-16 DIAGNOSIS — M25562 Pain in left knee: Secondary | ICD-10-CM | POA: Insufficient documentation

## 2018-03-16 DIAGNOSIS — I639 Cerebral infarction, unspecified: Secondary | ICD-10-CM

## 2018-03-16 DIAGNOSIS — M79602 Pain in left arm: Secondary | ICD-10-CM | POA: Diagnosis not present

## 2018-03-16 DIAGNOSIS — Z789 Other specified health status: Secondary | ICD-10-CM | POA: Diagnosis not present

## 2018-03-16 DIAGNOSIS — G35 Multiple sclerosis: Secondary | ICD-10-CM | POA: Diagnosis not present

## 2018-03-16 NOTE — Patient Instructions (Addendum)
____________________________________________________________________________________________  Preparing for Procedure with Sedation  Instructions: . Oral Intake: Do not eat or drink anything for at least 8 hours prior to your procedure. . Transportation: Public transportation is not allowed. Bring an adult driver. The driver must be physically present in our waiting room before any procedure can be started. . Physical Assistance: Bring an adult physically capable of assisting you, in the event you need help. This adult should keep you company at home for at least 6 hours after the procedure. . Blood Pressure Medicine: Take your blood pressure medicine with a sip of water the morning of the procedure. . Blood thinners: Notify our staff if you are taking any blood thinners. Depending on which one you take, there will be specific instructions on how and when to stop it. . Diabetics on insulin: Notify the staff so that you can be scheduled 1st case in the morning. If your diabetes requires high dose insulin, take only  of your normal insulin dose the morning of the procedure and notify the staff that you have done so. . Preventing infections: Shower with an antibacterial soap the morning of your procedure. . Build-up your immune system: Take 1000 mg of Vitamin C with every meal (3 times a day) the day prior to your procedure. . Antibiotics: Inform the staff if you have a condition or reason that requires you to take antibiotics before dental procedures. . Pregnancy: If you are pregnant, call and cancel the procedure. . Sickness: If you have a cold, fever, or any active infections, call and cancel the procedure. . Arrival: You must be in the facility at least 30 minutes prior to your scheduled procedure. . Children: Do not bring children with you. . Dress appropriately: Bring dark clothing that you would not mind if they get stained. . Valuables: Do not bring any jewelry or valuables.  Procedure  appointments are reserved for interventional treatments only. . No Prescription Refills. . No medication changes will be discussed during procedure appointments. . No disability issues will be discussed.  Reasons to call and reschedule or cancel your procedure: (Following these recommendations will minimize the risk of a serious complication.) . Surgeries: Avoid having procedures within 2 weeks of any surgery. (Avoid for 2 weeks before or after any surgery). . Flu Shots: Avoid having procedures within 2 weeks of a flu shots or . (Avoid for 2 weeks before or after immunizations). . Barium: Avoid having a procedure within 7-10 days after having had a radiological study involving the use of radiological contrast. (Myelograms, Barium swallow or enema study). . Heart attacks: Avoid any elective procedures or surgeries for the initial 6 months after a "Myocardial Infarction" (Heart Attack). . Blood thinners: It is imperative that you stop these medications before procedures. Let us know if you if you take any blood thinner.  . Infection: Avoid procedures during or within two weeks of an infection (including chest colds or gastrointestinal problems). Symptoms associated with infections include: Localized redness, fever, chills, night sweats or profuse sweating, burning sensation when voiding, cough, congestion, stuffiness, runny nose, sore throat, diarrhea, nausea, vomiting, cold or Flu symptoms, recent or current infections. It is specially important if the infection is over the area that we intend to treat. . Heart and lung problems: Symptoms that may suggest an active cardiopulmonary problem include: cough, chest pain, breathing difficulties or shortness of breath, dizziness, ankle swelling, uncontrolled high or unusually low blood pressure, and/or palpitations. If you are experiencing any of these symptoms, cancel   your procedure and contact your primary care physician for an evaluation.  Remember:   Regular Business hours are:  Monday to Thursday 8:00 AM to 4:00 PM  Provider's Schedule: Milinda Pointer, MD:  Procedure days: Tuesday and Thursday 7:30 AM to 4:00 PM  Gillis Santa, MD:  Procedure days: Monday and Wednesday 7:30 AM to 4:00 PM ____________________________________________________________________________________________    ______________________________________________________________________________________________  Weight Management Required  URGENT: Your weight has been found to be adversely affecting your health.  Dear Ms. Hoopingarner:  Your current Body mass index is 42.91 kg/m.Marland Kitchen Estimated body mass index is 42.91 kg/m as calculated from the following:   Height as of this encounter: 5\' 4"  (1.626 m).   Weight as of this encounter: 250 lb (113.4 kg).  Your last four (4) weight and BMI calculations are as follows: Wt Readings from Last 4 Encounters:  03/16/18 250 lb (113.4 kg)  12/02/17 265 lb (120.2 kg)  05/27/17 259 lb (117.5 kg)  05/12/17 262 lb 6 oz (119 kg)   BMI Readings from Last 4 Encounters:  03/16/18 42.91 kg/m  12/02/17 45.49 kg/m  05/27/17 44.46 kg/m  05/12/17 45.04 kg/m    Calculations estimate your ideal body weight to be: Ideal body weight: 54.7 kg (120 lb 9.5 oz) Adjusted ideal body weight: 78.2 kg (172 lb 5.7 oz)  Please use the table below to identify your weight category and associated incidence of chronic pain, secondary to your weight.  BMI interpretation table: BMI level Category Associated incidence of chronic pain  <18 kg/m2 Underweight   18.5-24.9 kg/m2 Ideal body weight   25-29.9 kg/m2 Overweight  20%  30-34.9 kg/m2 Obese (Class I)  68%  35-39.9 kg/m2 Severe obesity (Class II)  136%  >40 kg/m2 Extreme obesity (Class III)  254%   In addition: You will be considered "Morbidly Obese", if your BMI is above 30 and you have one or more of the following conditions which are known to be directly associated with obesity: 1.     Type 2 Diabetes (Which in turn can lead to cardiovascular diseases (CVD), stroke, peripheral vascular diseases (PVD), retinopathy, nephropathy, and neuropathy) 2.    Cardiovascular Disease (High Blood Pressure; Congestive Heart Failure; High Cholesterol; Coronary Artery Disease; Angina; or History of Heart Attacks) 3.    Breathing problems (Asthma; obesity-hypoventilation syndrome; obstructive sleep apnea; chronic inflammatory airway disease; reactive airway disease; or shortness of breath) 4.    Chronic kidney disease 5.    Liver disease (nonalcoholic fatty liver disease) 6.    High blood pressure 7.    Acid reflux (gastroesophageal reflux disease; heartburn) 8.    Osteoarthritis (OA) (with any of the following: hip pain; knee pain; and/or low back pain) 9.    Low back pain (Lumbar Facet Syndrome; and/or Degenerative Disc Disease) 10.  Hip pain (Osteoarthritis of hip) (For every 1 lbs of added body weight, there is a 2 lbs increase in pressure inside of each hip articulation. 1:2 mechanical relationship) 11.  Knee pain (Osteoarthritis of knee) (For every 1 lbs of added body weight, there is a 4 lbs increase in pressure inside of each knee articulation. 1:4 mechanical relationship) (patients with a BMI>30 kg/m2 were 6.8 times more likely to develop knee OA than normal-weight individuals) 12.  Cancer. Epidemiological studies have shown that obesity is a risk factor for: post-menopausal breast cancer; cancers of the endometrium, colon and kidney cancer; malignant adenomas of the oesophagus. Obese subjects have an approximately 1.5-3.5-fold increased risk of developing these cancers compared  with normal-weight subjects, and it has been estimated that between 15 and 45% of these cancers can be attributed to overweight. More recent studies suggest that obesity may also increase the risk of other types of cancer, including pancreatic, hepatic and gallbladder cancer. (Ref: Obesity and cancer. Pischon T,  Nthlings U, [employer] H. Proc Nutr Soc. 2008 May;67(2):128-45. doi: 62.7035/K0938182993716967.)  Recommendation: At this point it is urgent that you take a step back and concentrate in loosing weight. Dedicate 100% of your efforts on this task. Nothing else will improve your health more than bringing down your BMI to less than 30. Because most chronic pain patients do have difficulty exercising secondary to their pain, you must rely on proper nutrition and dieting in order to lose the weight. If your BMI is above 40, you should seriously consider bariatric surgery. A realistic goal is to lose 10% of your body weight over a period of 12 months.  If over time you have unsuccessfully try to lose weight, then it is time for you to seek professional help and to enter a medically supervised weight management program.  Pain management considerations:  1.    Pharmacological Problems: Be advised that the use of opioid analgesics (oxycodone; hydrocodone; morphine; methadone; codeine; and all of their derivatives) have been associated with decreased metabolism and weight gain.  For this reason, should we see that you are unable to lose weight while taking these medications, it may become necessary for [place] to taper down and indefinitely discontinue them.  2.    Technical Problems: The incidence of successful interventional therapies decreases as the patient's BMI increases. It is much more difficult to accomplish a safe and effective interventional therapy on a patient with a BMI above 35. Yours is Body mass index is 42.91 kg/m.Marland Kitchen  3.    Radiation Exposure Problems: The x-rays machine, used to accomplish injection therapies, will automatically increase their x-ray output in order to capture an appropriate bone image. This means that radiation exposure increases exponentially with the patient's BMI. (The higher the BMI, the higher the radiation exposure.) Although the level of radiation used at a given time is still safe to  the patient, it is not for the physician and/or assisting staff. Unfortunately, radiation exposure is accumulative. Because physicians and the staff have to do procedures and be exposed on a daily basis, this can result in health problems such as cancer and radiation burns. Radiation exposure to the staff is monitored by the radiation batches that they wear. The exposure levels are reported back to the staff on a quarterly basis. Depending on levels of exposure, physicians and staff may be obligated by law to decrease this exposure. This means that they have the right and obligation to refuse providing therapies where they may be overexposed to radiation. For this reason, physicians may decline to offer therapies such as radiofrequency ablation or implants to patients with a BMI above 40. 4.    Current Trends: Be advised that the current trend is to no longer offer certain therapies to patients with a BMI equal to, or above 35, due to increase perioperative risks, increased technical procedural difficulties, and excessive radiation exposure to healthcare personnel.  ______________________________________________________________________________________________    ______________________________________________________________________________________________  Specialty Pain Scale  Introduction:  There are significant differences in how pain is reported. The word pain usually refers to physical pain, but it is also a common synonym of suffering. The medical community uses a scale from 0 (zero) to 10 (ten) to report  pain level. Zero (0) is described as "no pain", while ten (10) is described as "the worse pain you can imagine". The problem with this scale is that physical pain is reported along with suffering. Suffering refers to mental pain, or more often yet it refers to any unpleasant feeling, emotion or aversion associated with the perception of harm or threat of harm. It is the psychological component of  pain.  Pain Specialists prefer to separate the two components. The pain scale used by this practice is the Verbal Numerical Rating Scale (VNRS-11). This scale is for the physical pain only. DO NOT INCLUDE how your pain psychologically affects you. This scale is for adults 42 years of age and older. It has 11 (eleven) levels. The 1st level is 0/10. This means: "right now, I have no pain". In the context of pain management, it also means: "right now, my physical pain is under control with the current therapy".  General Information:  The scale should reflect your current level of pain. Unless you are specifically asked for the level of your worst pain, or your average pain. If you are asked for one of these two, then it should be understood that it is over the past 24 hours.  Levels 1 (one) through 5 (five) are described below, and can be treated as an outpatient. Ambulatory pain management facilities such as ours are more than adequate to treat these levels. Levels 6 (six) through 10 (ten) are also described below, however, these must be treated as a hospitalized patient. While levels 6 (six) and 7 (seven) may be evaluated at an urgent care facility, levels 8 (eight) through 10 (ten) constitute medical emergencies and as such, they belong in a hospital's emergency department. When having these levels (as described below), do not come to our office. Our facility is not equipped to manage these levels. Go directly to an urgent care facility or an emergency department to be evaluated.  Definitions:  Activities of Daily Living (ADL): Activities of daily living (ADL or ADLs) is a term used in healthcare to refer to people's daily self-care activities. Health professionals often use a person's ability or inability to perform ADLs as a measurement of their functional status, particularly in regard to people post injury, with disabilities and the elderly. There are two ADL levels: Basic and Instrumental. Basic  Activities of Daily Living (BADL  or BADLs) consist of self-care tasks that include: Bathing and showering; personal hygiene and grooming (including brushing/combing/styling hair); dressing; Toilet hygiene (getting to the toilet, cleaning oneself, and getting back up); eating and self-feeding (not including cooking or chewing and swallowing); functional mobility, often referred to as "transferring", as measured by the ability to walk, get in and out of bed, and get into and out of a chair; the broader definition (moving from one place to another while performing activities) is useful for people with different physical abilities who are still able to get around independently. Basic ADLs include the things many people do when they get up in the morning and get ready to go out of the house: get out of bed, go to the toilet, bathe, dress, groom, and eat. On the average, loss of function typically follows a particular order. Hygiene is the first to go, followed by loss of toilet use and locomotion. The last to go is the ability to eat. When there is only one remaining area in which the person is independent, there is a 62.9% chance that it is eating and only  a 3.5% chance that it is hygiene. Instrumental Activities of Daily Living (IADL or IADLs) are not necessary for fundamental functioning, but they let an individual live independently in a community. IADL consist of tasks that include: cleaning and maintaining the house; home establishment and maintenance; care of others (including selecting and supervising caregivers); care of pets; child rearing; managing money; managing financials (investments, etc.); meal preparation and cleanup; shopping for groceries and necessities; moving within the community; safety procedures and emergency responses; health management and maintenance (taking prescribed medications); and using the telephone or other form of communication.  Instructions:  Most patients tend to report  their pain as a combination of two factors, their physical pain and their psychosocial pain. This last one is also known as "suffering" and it is reflection of how physical pain affects you socially and psychologically. From now on, report them separately.  From this point on, when asked to report your pain level, report only your physical pain. Use the following table for reference.  Pain Clinic Pain Levels (0-5/10)  Pain Level Score  Description  No Pain 0   Mild pain 1 Nagging, annoying, but does not interfere with basic activities of daily living (ADL). Patients are able to eat, bathe, get dressed, toileting (being able to get on and off the toilet and perform personal hygiene functions), transfer (move in and out of bed or a chair without assistance), and maintain continence (able to control bladder and bowel functions). Blood pressure and heart rate are unaffected. A normal heart rate for a healthy adult ranges from 60 to 100 bpm (beats per minute).   Mild to moderate pain 2 Noticeable and distracting. Impossible to hide from other people. More frequent flare-ups. Still possible to adapt and function close to normal. It can be very annoying and may have occasional stronger flare-ups. With discipline, patients may get used to it and adapt.   Moderate pain 3 Interferes significantly with activities of daily living (ADL). It becomes difficult to feed, bathe, get dressed, get on and off the toilet or to perform personal hygiene functions. Difficult to get in and out of bed or a chair without assistance. Very distracting. With effort, it can be ignored when deeply involved in activities.   Moderately severe pain 4 Impossible to ignore for more than a few minutes. With effort, patients may still be able to manage work or participate in some social activities. Very difficult to concentrate. Signs of autonomic nervous system discharge are evident: dilated pupils (mydriasis); mild sweating (diaphoresis);  sleep interference. Heart rate becomes elevated (>115 bpm). Diastolic blood pressure (lower number) rises above 100 mmHg. Patients find relief in laying down and not moving.   Severe pain 5 Intense and extremely unpleasant. Associated with frowning face and frequent crying. Pain overwhelms the senses.  Ability to do any activity or maintain social relationships becomes significantly limited. Conversation becomes difficult. Pacing back and forth is common, as getting into a comfortable position is nearly impossible. Pain wakes you up from deep sleep. Physical signs will be obvious: pupillary dilation; increased sweating; goosebumps; brisk reflexes; cold, clammy hands and feet; nausea, vomiting or dry heaves; loss of appetite; significant sleep disturbance with inability to fall asleep or to remain asleep. When persistent, significant weight loss is observed due to the complete loss of appetite and sleep deprivation.  Blood pressure and heart rate becomes significantly elevated. Caution: If elevated blood pressure triggers a pounding headache associated with blurred vision, then the patient should immediately seek  attention at an urgent or emergency care unit, as these may be signs of an impending stroke.    Emergency Department Pain Levels (6-10/10)  Emergency Room Pain 6 Severely limiting. Requires emergency care and should not be seen or managed at an outpatient pain management facility. Communication becomes difficult and requires great effort. Assistance to reach the emergency department may be required. Facial flushing and profuse sweating along with potentially dangerous increases in heart rate and blood pressure will be evident.   Distressing pain 7 Self-care is very difficult. Assistance is required to transport, or use restroom. Assistance to reach the emergency department will be required. Tasks requiring coordination, such as bathing and getting dressed become very difficult.   Disabling pain 8  Self-care is no longer possible. At this level, pain is disabling. The individual is unable to do even the most "basic" activities such as walking, eating, bathing, dressing, transferring to a bed, or toileting. Fine motor skills are lost. It is difficult to think clearly.   Incapacitating pain 9 Pain becomes incapacitating. Thought processing is no longer possible. Difficult to remember your own name. Control of movement and coordination are lost.   The worst pain imaginable 10 At this level, most patients pass out from pain. When this level is reached, collapse of the autonomic nervous system occurs, leading to a sudden drop in blood pressure and heart rate. This in turn results in a temporary and dramatic drop in blood flow to the brain, leading to a loss of consciousness. Fainting is one of the body's self defense mechanisms. Passing out puts the brain in a calmed state and causes it to shut down for a while, in order to begin the healing process.    Summary: 1.   Refer to this scale when providing [place] with your pain level. 2.   Be accurate and careful when reporting your pain level. This will help with your care. 3.   Over-reporting your pain level will lead to loss of credibility. 4.   Even a level of 1/10 means that there is pain and will be treated at our facility. 5.   High, inaccurate reporting will be documented as "Symptom Exaggeration", leading to loss of credibility and suspicions of possible secondary gains such as obtaining more narcotics, or wanting to appear disabled, for fraudulent reasons. 6.   Only pain levels of 5 or below will be seen at our facility. 7.   Pain levels of 6 and above will be sent to the Emergency Department and the appointment cancelled.  ______________________________________________________________________________________________     Knee Injection A knee injection is a procedure to get medicine into your knee joint to relieve the pain, swelling, and  stiffness of arthritis. Your health care provider uses a needle to inject medicine, which may also help to lubricate and cushion your knee joint. You may need more than one injection. Tell a health care provider about:  Any allergies you have.  All medicines you are taking, including vitamins, herbs, eye drops, creams, and over-the-counter medicines.  Any problems you or family members have had with anesthetic medicines.  Any blood disorders you have.  Any surgeries you have had.  Any medical conditions you have.  Whether you are pregnant or may be pregnant. What are the risks? Generally, this is a safe procedure. However, problems may occur, including:  Infection.  Bleeding.  Symptoms that get worse.  Damage to the area around your knee.  Allergic reaction to any of the medicines.  Skin reactions from repeated injections. What happens before the procedure?  Ask your health care provider about changing or stopping your regular medicines. This is especially important if you are taking diabetes medicines or blood thinners.  Plan to have someone take you home from the hospital or clinic. What happens during the procedure?   You will sit or lie down in a position for your knee to be treated.  The skin over your kneecap will be cleaned with a germ-killing soap.  You will be given a medicine that numbs the area (local anesthetic). You may feel some stinging.  The medicine will be injected into your knee. The needle is carefully placed between your kneecap and your knee. The medicine is injected into the joint space.  The needle will be removed at the end of the procedure.  A bandage (dressing) may be placed over the injection site. The procedure may vary among health care providers and hospitals. What can I expect after the procedure?  Your blood pressure, heart rate, breathing rate, and blood oxygen level will be monitored until you leave the hospital or clinic.  You  may have to move your knee through its full range of motion. This helps to get all the medicine into your joint space.  You will be watched to make sure that you do not have a reaction to the injected medicine.  You may feel more pain, swelling, and warmth than you did before the injection. This reaction may last about 1-2 days. Follow these instructions at home: Medicines  Take over-the-counter and prescription medicines only as told by your doctor.  Do not drive or use heavy machinery while taking prescription pain medicine.  Do not take medicines such as aspirin and ibuprofen unless your health care provider tells you to take them. Injection site care  Follow instructions from your health care provider about: ? How to take care of your puncture site. ? When and how you should change your dressing. ? When you should remove your dressing.  Check your injection area every day for signs of infection. Check for: ? More redness, swelling, or pain after 2 days. ? Fluid or blood. ? Pus or a bad smell. ? Warmth. Managing pain, stiffness, and swelling   If directed, put ice on the injection area: ? Put ice in a plastic bag. ? Place a towel between your skin and the bag. ? Leave the ice on for 20 minutes, 2-3 times per day.  Do not apply heat to your knee.  Raise (elevate) the injection area above the level of your heart while you are sitting or lying down. General instructions  If you were given a dressing, keep it dry until your health care provider says it can be removed. Ask your health care provider when you can start showering or taking a bath.  Avoid strenuous activities for as long as directed by your health care provider. Ask your health care provider when you can return to your normal activities.  Keep all follow-up visits as told by your health care provider. This is important. You may need more injections. Contact a health care provider if you have:  A  fever.  Warmth in your injection area.  Fluid, blood, or pus coming from your injection site.  Symptoms at your injection site that last longer than 2 days after your procedure. Get help right away if:  Your knee: ? Turns very red. ? Becomes very swollen. ? Is in severe pain. Summary  A knee injection is a procedure to get medicine into your knee joint to relieve the pain, swelling, and stiffness of arthritis.  A needle is carefully placed between your kneecap and your knee to inject medicine into the joint space.  Before the procedure, ask your health care provider about changing or stopping your regular medicines, especially if you are taking diabetes medicines or blood thinners.  Contact your health care provider if you have any problems or questions after your procedure. This information is not intended to replace advice given to you by your health care provider. Make sure you discuss any questions you have with your health care provider. Document Released: 03/17/2006 Document Revised: 01/13/2017 Document Reviewed: 01/13/2017 Elsevier Interactive Patient Education  2019 [employer].

## 2018-03-16 NOTE — H&P (Signed)
CC: Here for f/u regarding hemorrhoids  HPI: 54F with hx of multiple sclerosis, migraines, chronic pain related to MS (takes daily Belbuca - oral dissolving buprenorphine) presented to our office for evaluation of possible hemorrhoids in 2018. She described a 37yr hx of anal tissue prolapse which was bothersome. She noted anal canal tissue prolapse with BMs that occasionally requires manual reduction but often reduces on it's own. OCcasional bright red blood per rectum. Was seen 10/21/16 by Dr. Sallyanne Havers for a thrombosed external hemorrhoid and extreme pain - he lanced. She has had siginificant improvement in her pain.  When seen last, she denied daily fiber supplementation nor laxatives. Had BM every 2-3 days, intermittent constipation. Spended 10-15 minutes on the commode with each BM. Last colonoscopy 2008 at time of stoma reversal; done by Dr. Tacy Dura - she reports it was normal  INTERVAL HX She underwent colonoscopy by myself 01/29/17 which demonstrated nonthrombosed external and internal hemorrhoids. Multiple smallmouth diverticula in the sigmoid versus descending colon, patent functional end-to-end colocolonic anastomosis. No polyps were identified. She is due for repeat in 5 years. She is here today for follow-up regarding her hemorrhoids. She has had 2-3 subsequent episodes of what she describes as thromboses that were lanced. She is now taking a fiber supplement every day as well as daily MiraLAX. She reports having 1 soft/formed bowel movement per day. She typically spends to 3 minutes on, but occasionally spends more time if she is having pain that she attributes to a passing kidney stone. Despite all of her dietary and lifestyle modifications she still continues to have symptoms. She returns today requesting surgery for her hemorrhoids. She denies any bleeding per rectum. She reports discomfort with BMs and tissue prolapse, intermittent episodes of thrombosis.   She brings with her to  this visit pictures on her phone that she states her pictures of her anus when her hemorrhoids are "flared." This shows what appears to be 3 column external hemorrhoids that are edematous.  PMH: Multiple sclerosis (currently controlled - following with neurology) , diverticulitis, chronic pain related to MS (Following with pain management, taking PO buprenoprphine), migraines  PSH: C-sx x1; exploratory laparoscopy and what sounds to be sigmoid colectomy with end colostomy in [place], 2008; subsequent Hartman's reversal; multiple laparotomies and 2 prior ventral hernia repairs including mesh placement  XTK:WIOXBD family hx of malignancy  Social: Denies use of tobacco/EtOH/drug use.  The patient is a 38 year old female.   Allergies Emeline Gins, [place]; 02/09/2018 3:36 PM) Amitriptyline HCl *ANTIDEPRESSANTS*  hypertension Baclofen *DERMATOLOGICALS*  Hives, Shortness of breath. Cymbalta *ANTIDEPRESSANTS*  Shortness of breath, Rash. Gabapentin *ANTICONVULSANTS*  Shortness of breath, Rash. MONOSODIUM GLUTAMATE  Anaphylaxis. Vicodin *ANALGESICS - OPIOID*  Hives, Nausea, Vomiting. ALPRAZolam *ANTIANXIETY AGENTS*  Magnesium Salicylate *CHEMICALS*  Hives, Itching. Rizatriptan Benzoate *MIGRAINE PRODUCTS*  Nausea, Vomiting. Tizanidine Comfort Pac *MUSCULOSKELETAL THERAPY AGENTS*  Hives. TraMADol HCl *ANALGESICS - OPIOID*  Adhesive 1"x6yd *MEDICAL DEVICES AND SUPPLIES*  LamoTRIgine *ANTICONVULSANTS*  Rash. Toradol *ANALGESICS - ANTI-INFLAMMATORY*  Nausea. Allergies Reconciled   Medication History Emeline Gins, CMA; 02/09/2018 3:37 PM) Vitamin D (Ergocalciferol) (50000UNIT Capsule, Oral) Active. [employer] D3 (5000UNIT Capsule, Oral) Active. DiazePAM (5MG  Tablet, Oral) Active. Amoxicillin (500MG  Capsule, Oral) Active. Hydrocodone-Acetaminophen (5-325MG  Tablet, Oral) Active. Divalproex Sodium (500MG  Tablet DR, Oral) Active. Gilenya (0.5MG  Capsule, Oral) Active. Methocarbamol  (750MG  Tablet, Oral) Active. Ibuprofen (800MG  Tablet, Oral) Active. Divalproex Sodium (Migraine) (500MG  Tablet ER 24HR, Oral at bedtime) Active. Topiramate (50MG  Tablet, Oral two times daily) Active. SUMAtriptan Succinate (100MG  Tablet,  Oral as needed) Active. AmLODIPine Besylate (5MG  Tablet, Oral every morning) Active. Methocarbamol (500MG  Tablet, Oral two times daily as needed) Active. BusPIRone HCl (15MG  Tablet, Oral three times daily) Active. piazepam (5mg  daily) Active. FLUoxetine HCl (60MG  Tablet, Oral daily) Active. HydroCHLOROthiazide (25MG  Tablet, Oral daily) Active. Metoclopramide HCl (10MG  Tablet, Oral as needed) Active. Albuterol Sulfate HFA (108 (90 Base)MCG/ACT Aerosol Soln, Inhalation) Active. epi pen  (0.3mg ) Active. RaNITidine HCl (300MG  Tablet, Oral at bedtime) Active. Modafinil (200MG  Tablet, Oral) Active. Medications Reconciled    Review of Systems Harrell Gave M. Kaio Kuhlman MD; 02/09/2018 4:05 PM) General Not Present- Appetite Loss, Chills, Fatigue, Fever, Weight Gain and Weight Loss. Skin Not Present- Change in Wart/Mole, Dryness, Hives, Jaundice, New Lesions, Non-Healing Wounds, Rash and Ulcer. HEENT Present- Seasonal Allergies and Wears glasses/contact lenses. Not Present- Earache, Hearing Loss, Hoarseness, Nose Bleed, Oral Ulcers, Ringing in the Ears, Sinus Pain, Sore Throat, Visual Disturbances and Yellow Eyes. Respiratory Not Present- Bloody sputum, Chronic Cough, Difficulty Breathing, Snoring and Wheezing. Breast Not Present- Breast Mass, Breast Pain, Nipple Discharge and Skin Changes. Cardiovascular Not Present- Chest Pain, Difficulty Breathing Lying Down, Leg Cramps, Palpitations, Rapid Heart Rate, Shortness of Breath and Swelling of Extremities. Gastrointestinal Present- Hemorrhoids and Rectal Pain. Not Present- Abdominal Pain, Bloating, Bloody Stool, Change in Bowel Habits, Chronic diarrhea, Constipation, Difficulty Swallowing, Excessive gas, Gets  full quickly at meals, Indigestion, Nausea and Vomiting. Female Genitourinary Present- Frequency and Urgency. Not Present- Nocturia, Painful Urination and Pelvic Pain. Musculoskeletal Present- Back Pain. Not Present- Joint Pain, Joint Stiffness, Muscle Pain, Muscle Weakness and Swelling of Extremities. Neurological Present- Fainting, Headaches, Numbness, Seizures, Tingling, Trouble walking and Weakness. Not Present- Decreased Memory and Tremor. Endocrine Not Present- Cold Intolerance, Excessive Hunger, Hair Changes, Heat Intolerance, Hot flashes and New Diabetes. Hematology Not Present- Blood Thinners, Easy Bruising, Excessive bleeding, Gland problems, HIV and Persistent Infections.  Vitals Emeline Gins CMA; 02/09/2018 3:36 PM) 02/09/2018 3:35 PM Weight: 255 lb Height: 64in Body Surface Area: 2.17 m Body Mass Index: 43.77 kg/m  Temp.: 97.55F  Pulse: 89 (Regular)  BP: 126/82 (Sitting, Left Arm, Standard)       Physical Exam Harrell Gave M. Perrion Diesel MD; 02/09/2018 4:09 PM) The physical exam findings are as follows: Note:Constitutional: No acute distress; conversant; no deformities Eyes: Moist conjunctiva; no lid lag; anicteric sclerae; pupils equal round and reactive to light Neck: Trachea midline; no palpable thyromegaly Lungs: Normal respiratory effort; no tactile fremitus CV: Regular rate and rhythm; no palpable thrill; no pitting edema GI: Abdomen obese, soft, nontender, nondistended; no palpable hepatosplenomegaly Anorectal: Currently decompressed 3 column external hemorrhoids as well as associated internal hemorrhoids. Normal-appearing perianal skin. DRE-good tone and squeeze. Anoscopy deferred at her request. MSK: Normal gait; no clubbing/cyanosis Psychiatric: Appropriate affect; alert and oriented 3 Lymphatic: No palpable cervical or axillary lymphadenopathy **A chaperone, Lennart Pall, was present for this encounter    Assessment & Plan Harrell Gave M. Delford Wingert  MD; 02/09/2018 4:27 PM) HEMORRHOID (K64.9) Story: Ms. Youtz is a very pleasant 22yoF with hx of MS, migraines, chronic pain (taking PO buprenorphine) presents for follow-up evaluation of her hemorrhoidal disease Impression: -She has not tried medical management with daily fiber supplementation and MiraLAX as well as increasing her water intake. She has been minimizing time on commode. She's now having soft/formed bowel movements daily. Despite these maneuvers she still having episodes of recurrent thromboses and problems related to her hemorrhoids and would like them addressed surgically -The anatomy and physiology of the anal canal was discussed at length with  the patient again today using our hemorrhoid handout. The pathophysiology of hemorrhoids was was reviewed at length with associated pictures and illustrations as well -We discussed hemorrhoidectomy, anorectal exam under anesthesia -The procedure including technique, material risks (including, but not limited to, pain, bleeding, infection, pelvic sepsis which is rare and in certain cases can require diverting stoma, scarring, anal stenosis, need for blood transfusion, need for additional procedures, damage to anal sphincter, incontinence of gas and/or stool, need for additional procedures, recurrence, pneumonia, heart attack, stroke, death) benefits and alternatives to surgery were discussed at length. I noted a good probability that the procedure would help improve their symptoms. We discussed possibility of not removing all hemorrhoidal tissue at this operation by going after the largest hemorrhoidal tissue if removal of all tissue would be felt to place her at high risk for anal stenosis. The patient's questions were answered to her satisfaction, she voiced understanding and elected to proceed with surgery. Additionally, we discussed typical postoperative expectations and the recovery process. -Given her history of narcotic use-oral buprenorphine being  managed by Dr. Merlene Laughter - both she and myself are planning to reach out to discuss the upcoming procedure and plan of care for postoperative pain control.  Signed electronically by Ileana Roup, MD (02/09/2018 4:27 PM)

## 2018-03-16 NOTE — Progress Notes (Signed)
Safety precautions to be maintained throughout the outpatient stay will include: orient to surroundings, keep bed in low position, maintain call bell within reach at all times, provide assistance with transfer out of bed and ambulation.  

## 2018-03-19 LAB — COMP. METABOLIC PANEL (12)
ALK PHOS: 96 IU/L (ref 39–117)
AST: 12 IU/L (ref 0–40)
Albumin/Globulin Ratio: 1.5 (ref 1.2–2.2)
Albumin: 4 g/dL (ref 3.8–4.8)
BUN/Creatinine Ratio: 16 (ref 9–23)
BUN: 12 mg/dL (ref 6–24)
Bilirubin Total: 0.2 mg/dL (ref 0.0–1.2)
Calcium: 8.6 mg/dL — ABNORMAL LOW (ref 8.7–10.2)
Chloride: 110 mmol/L — ABNORMAL HIGH (ref 96–106)
Creatinine, Ser: 0.75 mg/dL (ref 0.57–1.00)
GFR calc Af Amer: 115 mL/min/{1.73_m2} (ref >59)
GFR calc non Af Amer: 100 mL/min/{1.73_m2} (ref >59)
Globulin, Total: 2.7 g/dL (ref 1.5–4.5)
Glucose: 83 mg/dL (ref 65–99)
Potassium: 4 mmol/L (ref 3.5–5.2)
Sodium: 142 mmol/L (ref 134–144)
Total Protein: 6.7 g/dL (ref 6.0–8.5)

## 2018-03-19 LAB — 25-HYDROXY VITAMIN D LCMS D2+D3
25-Hydroxy, Vitamin D-2: 4.7 ng/mL
25-Hydroxy, Vitamin D-3: 15 ng/mL
25-Hydroxy, Vitamin D: 20 ng/mL — ABNORMAL LOW

## 2018-03-19 LAB — SEDIMENTATION RATE: SED RATE: 20 mm/h (ref 0–32)

## 2018-03-19 LAB — C-REACTIVE PROTEIN: CRP: 6 mg/L (ref 0–10)

## 2018-03-19 LAB — VITAMIN B12: Vitamin B-12: 521 pg/mL (ref 232–1245)

## 2018-03-19 LAB — MAGNESIUM: Magnesium: 2.2 mg/dL (ref 1.6–2.3)

## 2018-03-20 LAB — COMPLIANCE DRUG ANALYSIS, UR

## 2018-03-23 ENCOUNTER — Other Ambulatory Visit: Payer: Self-pay

## 2018-03-23 ENCOUNTER — Ambulatory Visit (INDEPENDENT_AMBULATORY_CARE_PROVIDER_SITE_OTHER): Payer: Medicare Other | Admitting: Nurse Practitioner

## 2018-03-23 ENCOUNTER — Encounter: Payer: Self-pay | Admitting: Nurse Practitioner

## 2018-03-23 DIAGNOSIS — R112 Nausea with vomiting, unspecified: Secondary | ICD-10-CM

## 2018-03-23 DIAGNOSIS — G43611 Persistent migraine aura with cerebral infarction, intractable, with status migrainosus: Secondary | ICD-10-CM | POA: Diagnosis not present

## 2018-03-23 DIAGNOSIS — I639 Cerebral infarction, unspecified: Secondary | ICD-10-CM

## 2018-03-23 MED ORDER — OMEPRAZOLE 20 MG PO CPDR: 20.0000 mg | DELAYED_RELEASE_CAPSULE | Freq: Two times a day (BID) | ORAL | 3 refills | Status: DC

## 2018-03-23 NOTE — Assessment & Plan Note (Signed)
The patient describes chronic, years long nausea with vomiting.  Initially started with her migraine headaches.  She currently has MS.  She states she will have vomiting almost every time she tries to eat unless she is able to "catch it by placing an ice pack on my stomach at which point I will have diarrhea."  Multiple comorbid conditions.  History of GERD and esophagitis, last EGD about 10 to 15 years ago at [facility].  Not currently on a PPI but on H2 receptor blocker.  Query nausea as a result of poorly controlled GERD.  We will start her on Prilosec 20 mg twice daily.  She is also at risk with multiple sclerosis and other medications for gastroparesis.  We will check a gastric emptying study at this time.  Return for follow-up in 6 weeks.  If persistent symptoms we can plan for an EGD at that time.

## 2018-03-23 NOTE — Patient Instructions (Signed)
Your health issues we discussed today were:   Nausea with vomiting: 1. Stop taking Zantac.  Start taking Prilosec 20 mg twice daily on an empty stomach.  I have sent this to your pharmacy 2. We will help schedule your stomach emptying test at any pain hospital 3. Further recommendations will follow.  Overall I recommend:  1. Return for follow-up in 6 weeks 2. Call if you have any questions or concerns.   Because of recent events of COVID-19 ("Coronavirus"), follow CDC recommendations:  1. Wash your hand frequently 2. Avoid touching your face 3. Stay away from people who are sick 4. If you have symptoms such as fever, cough, shortness of breath then call your healthcare provider for further guidance 5. If you are sick, STAY AT HOME unless otherwise directed by your healthcare provider.    At [facility] we value your feedback. You may receive a survey about your visit today. Please share your experience as we strive to create trusting relationships with our patients to provide genuine, compassionate, quality care.  We appreciate your understanding and patience as we review any laboratory studies, imaging, and other diagnostic tests that are ordered as we care for you. Our office policy is 5 business days for review of these results, and any emergent or urgent results are addressed in a timely manner for your best interest. If you do not hear from our office in 1 week, please contact us.   We also encourage the use of MyChart, which contains your medical information for your review as well. If you are not enrolled in this feature, an access code is on this after visit summary for your convenience. Thank you for allowing [place] to be involved in your care.  It was great to see you today!  I hope you have a great day!!

## 2018-03-23 NOTE — Progress Notes (Signed)
CC'D TO PCP °

## 2018-03-23 NOTE — Progress Notes (Signed)
Primary Care Physician:  Monico Blitz, MD Primary Gastroenterologist:  Dr. Gala Romney  Chief Complaint  Patient presents with  . Nausea    w/ vomiting. Patient reports she has tried several nausea meds but doesn't help    HPI:   Emily [name] is a 38 y.o. female who presents on referral from primary care for lower abdominal pain.  The patient has not been seen in our office.  However, the patient did have a colonoscopy 01/29/2017 by surgeon Dr. Nadeen Landau for rectal bleeding which found nonthrombosed external hemorrhoids and nonthrombosed internal hemorrhoids on perianal exams, diverticulosis, patent and functional end-to-end colocolonic anastomosis, otherwise normal.  Recommended repeat colonoscopy in 5 years.  The patient has a scheduled rectal exam under anesthesia for 04/27/2018.  Reviewed primary care office visit note dated 01/09/2018 at which point the patient described as nausea that has been occurring intermittently for years, increasing over the previous year, vomiting undigested food.  Symptoms associated with abdominal pain and diarrhea.  Past medical history indicates reflux esophagitis.  Discussed the patient's complaints with the nurse prior to going to the room.  The nurse states the patient told her she has had nausea for ever and is tried multiple medications none of which have worked.  She is requesting Marinol today on the advice of a friend.  Today she states she's doing ok overall. Has MS and has 10 diagnosed lesions and 5 new lesions recently diagnosed. In a flare but improving. Has always had nausea with migraines. Started on Zofran, Reglan, Compazine none of which worked. Has vomiting everytime she eats "unless I catch it by placing a cold pack on my stomach then I'll have diarrhea. Has been forcing herself to eat but has vomiting. Family not understanding of her health problems. Has a bowel movement daily, if she eats. If she doesn't eat she won't have a bowel  movement. Feels like she has a gagging in her throat when she tries to eat. Has been at the same weight for the last 2 years. Takes Ranitidine for GERD, will have reflux in the evenings when she lays down. About twice a month has to take Zantac during the day. Denies hematochezia, melena, fever, chills, unintentional weight loss. Had an EGD "about 10-15 years ago, I think at [facility]." Denies chest pain, dyspnea, dizziness, lightheadedness, syncope, near syncope. Denies any other upper or lower GI symptoms.  No NSAIDs, ASA powders.  Past Medical History:  Diagnosis Date  . Anxiety   . Asthma as a child  . Chronic back pain   . Chronic pain   . Conversion disorder   . Depression   . GERD (gastroesophageal reflux disease)   . Headache(784.0)    migraines  . HTN (hypertension) 02/27/2015  . JC virus antibody positive   . Migraines   . MS (multiple sclerosis) ([place])    06-2006  . Multiple sclerosis ([place])   . Ovarian cyst   . Panic attack   . Perforated bowel ([place]) 2009  . Pseudoseizures   . PTSD (post-traumatic stress disorder)   . S/P emergency C-section   . Urinary urgency     Past Surgical History:  Procedure Laterality Date  . ABDOMINAL SURGERY    . APPENDECTOMY    . BOWEL RESECTION  01/2007   with colostomy  . CESAREAN SECTION N/A 07/12/2015   Procedure: CESAREAN SECTION;  Surgeon: Guss Bunde, MD;  Location: [facility];  Service: Obstetrics;  Laterality: N/A;  .  COLONOSCOPY WITH PROPOFOL N/A 01/29/2017   Procedure: COLONOSCOPY WITH PROPOFOL;  Surgeon: Ileana Roup, MD;  Location: [facility];  Service: General;  Laterality: N/A;  . COLOSTOMY CLOSURE  04/2007  . EXTREMITY CYST EXCISION  1994   right leg  . HERNIA REPAIR    . INCISIONAL HERNIA REPAIR N/A 02/16/2016   Procedure: HERNIA REPAIR INCISIONAL;  Surgeon: Fanny Skates, MD;  Location: [facility];  Service: General;  Laterality: N/A;  . INSERTION OF MESH  02/16/2016   Procedure: INSERTION OF MESH;   Surgeon: Fanny Skates, MD;  Location: [facility];  Service: General;;  . RADIOLOGY WITH ANESTHESIA N/A 03/06/2017   Procedure: MRI WITH ANESTHESIA OF CERVICAL SPINE WITHOUT CONTRAST, MRI OF LUMBAR SPINE WITHOUT CONTRAST;  Surgeon: Radiologist, Medication, MD;  Location: [facility];  Service: Radiology;  Laterality: N/A;  . SCAR REVISION  01/21/2011   Procedure: SCAR REVISION;  Surgeon: Hermelinda Dellen;  Location: [facility];  Service: Plastics;  Laterality: N/A;  exploration of scar of abdomen and repair of defect  . TOOTH EXTRACTION Left 10/2016    Current Outpatient Medications  Medication Sig Dispense Refill  . albuterol (PROVENTIL HFA;VENTOLIN HFA) 108 (90 Base) MCG/ACT inhaler Inhale 2 puffs into the lungs every 6 (six) hours as needed for wheezing or shortness of breath. (Patient taking differently: Inhale 2 puffs into the lungs every 4 (four) hours as needed for wheezing or shortness of breath. ) 1 Inhaler 0  . amLODipine (NORVASC) 5 MG tablet Take 5 mg by mouth every evening.  2  . busPIRone (BUSPAR) 15 MG tablet Take 1 tablet (15 mg total) by mouth 3 (three) times daily. 90 tablet 1  . diazepam (VALIUM) 5 MG tablet Take 1 tablet (5 mg total) by mouth 2 (two) times daily as needed for anxiety. 60 tablet 1  . divalproex (DEPAKOTE) 500 MG DR tablet Take 1 tablet (500 mg total) by mouth at bedtime. 90 tablet 3  . EPINEPHrine (EPIPEN) 0.3 mg/0.3 mL SOAJ injection Inject 0.3 mg into the muscle as needed (allergic reaction). Reported on 03/28/2015    . etonogestrel (NEXPLANON) 68 MG IMPL implant 1 each by Subdermal route once.    Marland Kitchen FLUoxetine (PROZAC) 40 MG capsule Take 2 capsules (80 mg total) by mouth daily. 60 capsule 2  . GILENYA 0.5 MG CAPS TAKE 1 CAPSULE BY MOUTH EVERY DAY 30 capsule 11  . ibuprofen (ADVIL,MOTRIN) 600 MG tablet Take 600 mg by mouth daily as needed for moderate pain.    Marland Kitchen lithium carbonate (LITHOBID) 300 MG CR tablet Take 2 tablets (600 mg total) by mouth at  bedtime. 60 tablet 1  . loratadine (CLARITIN) 10 MG tablet Take 10 mg by mouth daily as needed for allergies.    . methocarbamol (ROBAXIN) 750 MG tablet Take 750 mg by mouth 2 (two) times daily.  0  . Multiple Vitamins-Minerals (MULTIVITAMIN ADULT PO) Take 1 tablet by mouth daily.     . pregabalin (LYRICA) 100 MG capsule Take 1 capsule (100 mg total) by mouth 3 (three) times daily. 90 capsule 0  . prochlorperazine (COMPAZINE) 10 MG tablet Take 1 tablet (10 mg total) by mouth every 6 (six) hours as needed for nausea or vomiting. 30 tablet 3  . ranitidine (ZANTAC) 300 MG tablet Take 300 mg by mouth at bedtime.     . topiramate (TOPAMAX) 100 MG tablet Take 1.5 tablets twice a day 270 tablet 3  . methocarbamol (ROBAXIN) 750 MG tablet Take 1  tablet (750 mg total) by mouth 2 (two) times daily. 180 tablet 0   Current Facility-Administered Medications  Medication Dose Route Frequency Provider Last Rate Last Dose  . metoCLOPramide (REGLAN) 10 mg in dextrose 5 % 50 mL IVPB  10 mg Intravenous Once Cameron Sprang, MD       Facility-Administered Medications Ordered in Other Visits  Medication Dose Route Frequency Provider Last Rate Last Dose  . metoCLOPramide (REGLAN) injection 10 mg  10 mg Intravenous Once Milton Ferguson, MD        Allergies as of 03/23/2018 - Review Complete 03/23/2018  Allergen Reaction Noted  . Amitriptyline Hypertension and Other (See Comments) 01/15/2011  . Baclofen Hives and Shortness Of Breath 01/15/2011  . Duloxetine hcl Shortness Of Breath and Rash 01/15/2011  . Gabapentin Shortness Of Breath and Rash 01/15/2011  . Hydrocodone-acetaminophen Hives and Nausea And Vomiting 01/21/2011  . Magnesium salicylate Hives and Itching 04/17/2013  . Monosodium glutamate Anaphylaxis 04/17/2013  . Other Shortness Of Breath, Rash, and Other (See Comments) 01/15/2011  . Tizanidine Hives 04/17/2013  . Alprazolam Other (See Comments) 01/15/2011  . Rizatriptan Nausea And Vomiting and Other  (See Comments) 08/17/2013  . Vicodin [hydrocodone-acetaminophen] Hives, Nausea And Vomiting, and Other (See Comments) 01/21/2011  . Modafinil  03/16/2018  . Adhesive [tape] Other (See Comments) 01/15/2011  . Ketorolac tromethamine Nausea Only 10/09/2013  . Lamotrigine Rash 08/17/2013  . Tramadol Nausea And Vomiting 12/04/2013    Family History  Problem Relation Age of Onset  . Diabetes Mother   . Hypertension Mother   . Diabetes Father   . Hypertension Father   . Arthritis Father   . Cancer Maternal Grandmother   . Cancer Maternal Grandfather   . Alcohol abuse Neg Hx   . Anxiety disorder Neg Hx   . Bipolar disorder Neg Hx   . Drug abuse Neg Hx   . Depression Neg Hx   . Colon cancer Neg Hx     Social History   Socioeconomic History  . Marital status: Single    Spouse name: Not on file  . Number of children: 1  . Years of education: College  . Highest education level: Not on file  Occupational History    Employer: OTHER    Comment: disability  Social Needs  . Financial resource strain: Not on file  . Food insecurity:    Worry: Not on file    Inability: Not on file  . Transportation needs:    Medical: Not on file    Non-medical: Not on file  Tobacco Use  . Smoking status: Former Smoker    Packs/day: 0.25    Years: 10.00    Pack years: 2.50    Types: Cigarettes    Last attempt to quit: 07/12/2015    Years since quitting: 2.6  . Smokeless tobacco: Never Used  Substance and Sexual Activity  . Alcohol use: No    Alcohol/week: 0.0 standard drinks    Comment: Rare  . Drug use: No  . Sexual activity: Not Currently    Partners: Male    Birth control/protection: None, Implant  Lifestyle  . Physical activity:    Days per week: Not on file    Minutes per session: Not on file  . Stress: Not on file  Relationships  . Social connections:    Talks on phone: Not on file    Gets together: Not on file    Attends religious service: Not on file    Active  member of club  or organization: Not on file    Attends meetings of clubs or organizations: Not on file    Relationship status: Not on file  . Intimate partner violence:    Fear of current or ex partner: Not on file    Emotionally abused: Not on file    Physically abused: Not on file    Forced sexual activity: Not on file  Other Topics Concern  . Not on file  Social History Narrative   Patient lives at home with fiance in [place]. Born and raised in [place], [place] by parents. Pt has one younger sister. Pt has a Best boy. Pt worked from 1999-2006. She stopped due to MS and is currently on disability. Pt is pregnant with her 1st child. Married for less than 1 yr in 1999 that ended in divorce.             Caffeine Use: 1 20oz soda daily    Review of Systems: General: Negative for anorexia, weight loss, fever, chills, fatigue, weakness. ENT: Negative for hoarseness, difficulty swallowing. CV: Negative for chest pain, angina, palpitations, peripheral edema.  Respiratory: Negative for dyspnea at rest, cough, sputum, wheezing.  GI: See history of present illness. MS: Negative for joint pain, low back pain.  Derm: Negative for rash or itching.  Endo: Negative for unusual weight change.  Heme: Negative for bruising or bleeding. Allergy: Negative for rash or hives.    Physical Exam: BP 135/90   Pulse 83   Temp (!) 97.5 F (36.4 C) (Oral)   Ht 5\' 4"  (1.626 m)   Wt 254 lb 9.6 oz (115.5 kg)   BMI 43.70 kg/m  General:   Alert and oriented. Pleasant and cooperative. Well-nourished and well-developed.  Head:  Normocephalic and atraumatic. Eyes:  Without icterus, sclera clear and conjunctiva pink.  Ears:  Normal auditory acuity. Cardiovascular:  S1, S2 present without murmurs appreciated. Extremities without clubbing or edema. Respiratory:  Clear to auscultation bilaterally. No wheezes, rales, or rhonchi. No distress.  Gastrointestinal:  +BS, soft, non-tender and non-distended.  No HSM noted. No guarding or rebound. No masses appreciated.  Rectal:  Deferred  Musculoskalatal:  Symmetrical without gross deformities. Neurologic:  Alert and oriented x4;  grossly normal neurologically. Psych:  Alert and cooperative. Normal mood and affect. Heme/Lymph/Immune: No excessive bruising noted.    03/23/2018 9:20 AM   Disclaimer: This note was dictated with voice recognition software. Similar sounding words can inadvertently be transcribed and may not be corrected upon review.

## 2018-03-24 ENCOUNTER — Ambulatory Visit (HOSPITAL_BASED_OUTPATIENT_CLINIC_OR_DEPARTMENT_OTHER): Payer: Medicare Other | Admitting: Pain Medicine

## 2018-03-24 ENCOUNTER — Ambulatory Visit
Admission: RE | Admit: 2018-03-24 | Discharge: 2018-03-24 | Disposition: A | Discharge status: Home / Self Care | Payer: Medicare Other | Source: Ambulatory Visit | Attending: Pain Medicine | Admitting: Pain Medicine

## 2018-03-24 ENCOUNTER — Other Ambulatory Visit: Payer: Self-pay

## 2018-03-24 ENCOUNTER — Encounter: Payer: Self-pay | Admitting: Pain Medicine

## 2018-03-24 VITALS — BP 111/80 | HR 77 | Temp 98.3°F | Resp 13 | Ht 64.0 in | Wt 254.0 lb

## 2018-03-24 DIAGNOSIS — M5136 Other intervertebral disc degeneration, lumbar region: Secondary | ICD-10-CM | POA: Insufficient documentation

## 2018-03-24 DIAGNOSIS — G8929 Other chronic pain: Secondary | ICD-10-CM | POA: Insufficient documentation

## 2018-03-24 DIAGNOSIS — R55 Syncope and collapse: Secondary | ICD-10-CM | POA: Insufficient documentation

## 2018-03-24 DIAGNOSIS — M5442 Lumbago with sciatica, left side: Secondary | ICD-10-CM | POA: Diagnosis not present

## 2018-03-24 DIAGNOSIS — M5441 Lumbago with sciatica, right side: Secondary | ICD-10-CM | POA: Diagnosis not present

## 2018-03-24 DIAGNOSIS — M47816 Spondylosis without myelopathy or radiculopathy, lumbar region: Secondary | ICD-10-CM

## 2018-03-24 DIAGNOSIS — M47817 Spondylosis without myelopathy or radiculopathy, lumbosacral region: Secondary | ICD-10-CM | POA: Insufficient documentation

## 2018-03-24 MED ORDER — LIDOCAINE HCL 2 % IJ SOLN
INTRAMUSCULAR | Status: AC
  Filled 2018-03-24: qty 20

## 2018-03-24 MED ORDER — TRIAMCINOLONE ACETONIDE 40 MG/ML IJ SUSP
INTRAMUSCULAR | Status: AC
  Filled 2018-03-24: qty 2

## 2018-03-24 MED ORDER — GLYCOPYRROLATE 0.2 MG/ML IJ SOLN
INTRAMUSCULAR | Status: AC
  Filled 2018-03-24: qty 1

## 2018-03-24 MED ORDER — MIDAZOLAM HCL 5 MG/5ML IJ SOLN
1.0000 mg | INTRAMUSCULAR | Status: DC | PRN
  Administered 2018-03-24: 5 mg via INTRAVENOUS

## 2018-03-24 MED ORDER — GLYCOPYRROLATE 0.2 MG/ML IJ SOLN
0.2000 mg | Freq: Once | INTRAMUSCULAR | Status: AC
  Administered 2018-03-24: 0.2 mg via INTRAVENOUS

## 2018-03-24 MED ORDER — LIDOCAINE HCL 2 % IJ SOLN
20.0000 mL | Freq: Once | INTRAMUSCULAR | Status: AC
  Administered 2018-03-24: 400 mg

## 2018-03-24 MED ORDER — ROPIVACAINE HCL 2 MG/ML IJ SOLN
18.0000 mL | Freq: Once | INTRAMUSCULAR | Status: AC
  Administered 2018-03-24: 18 mL via PERINEURAL

## 2018-03-24 MED ORDER — TRIAMCINOLONE ACETONIDE 40 MG/ML IJ SUSP
80.0000 mg | Freq: Once | INTRAMUSCULAR | Status: AC
  Administered 2018-03-24: 80 mg

## 2018-03-24 MED ORDER — DIPHENHYDRAMINE HCL 50 MG/ML IJ SOLN
INTRAMUSCULAR | Status: AC
  Filled 2018-03-24: qty 1

## 2018-03-24 MED ORDER — FENTANYL CITRATE (PF) 100 MCG/2ML IJ SOLN
25.0000 ug | INTRAMUSCULAR | Status: DC | PRN
  Administered 2018-03-24: 100 ug via INTRAVENOUS

## 2018-03-24 MED ORDER — FENTANYL CITRATE (PF) 100 MCG/2ML IJ SOLN
INTRAMUSCULAR | Status: AC
  Filled 2018-03-24: qty 2

## 2018-03-24 MED ORDER — ROPIVACAINE HCL 2 MG/ML IJ SOLN
INTRAMUSCULAR | Status: AC
  Filled 2018-03-24: qty 10

## 2018-03-24 MED ORDER — DIPHENHYDRAMINE HCL 50 MG/ML IJ SOLN
25.0000 mg | Freq: Once | INTRAMUSCULAR | Status: AC
  Administered 2018-03-24: 25 mg via INTRAVENOUS

## 2018-03-24 MED ORDER — MIDAZOLAM HCL 5 MG/5ML IJ SOLN
INTRAMUSCULAR | Status: AC
  Filled 2018-03-24: qty 5

## 2018-03-24 MED ORDER — LACTATED RINGERS IV SOLN
1000.0000 mL | Freq: Once | INTRAVENOUS | Status: AC
  Administered 2018-03-24: 1000 mL via INTRAVENOUS

## 2018-03-24 NOTE — Progress Notes (Signed)
Patient's Name: Emily [name]  MRN: [id number]  Referring Provider: Monico Blitz, MD  DOB: 02/07/1980  PCP: Monico Blitz, MD  DOS: 03/24/2018  Note by: Gaspar Cola, MD  Service setting: Ambulatory outpatient  Specialty: Interventional Pain Management  Patient type: Established  Location: [facility] (AMB) Pain Management Facility  Visit type: Interventional Procedure   Primary Reason for Visit: Interventional Pain Management Treatment. CC: Back Pain (left, lower)  Procedure:          Anesthesia, Analgesia, Anxiolysis:  Type: Lumbar Facet, Medial Branch Block(s) #1  Primary Purpose: Diagnostic Region: Posterolateral Lumbosacral Spine Level: L2, L3, L4, L5, & S1 Medial Branch Level(s). Injecting these levels blocks the L3-4, L4-5, and L5-S1 lumbar facet joints. Laterality: Bilateral  Type: Moderate (Conscious) Sedation combined with Local Anesthesia Indication(s): Analgesia and Anxiety Route: Intravenous (IV) IV Access: Secured Sedation: Meaningful verbal contact was maintained at all times during the procedure  Local Anesthetic: Lidocaine 1-2%  Position: Prone   Indications: 1. Spondylosis without myelopathy or radiculopathy, lumbosacral region   2. Lumbar facet syndrome (Bilateral) (R>L)   3. Lumbar facet arthropathy   4. DDD (degenerative disc disease), lumbar   5. Chronic low back pain (Secondary Area of Pain) (Bilateral) (L>R)   6. History of vasovagal syncope    Pain Score: Pre-procedure: 5 /10 Post-procedure: 0-No pain/10  Pre-op Assessment:  Emily [name] is a 38 y.o. (year old), female patient, seen today for interventional treatment. 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 Emily [name] has a current medication list which includes the following prescription(s): albuterol, amlodipine, buspirone, diazepam, divalproex, epinephrine, etonogestrel, fluoxetine, gilenya, lithium carbonate, loratadine, methocarbamol, multiple vitamins-minerals, omeprazole, pregabalin, prochlorperazine, ranitidine, topiramate, and methocarbamol, and the following Facility-Administered Medications: fentanyl, metoCLOPramide (REGLAN) 10 mg in dextrose 5 % 50 mL IVPB, metoclopramide, and midazolam. Her primarily concern today is the Back Pain (left, lower)  Initial Vital Signs:  Pulse/HCG Rate: 77ECG Heart Rate: 70 Temp: 98.5 F (36.9 C) Resp: 16 BP: 132/67 SpO2: 100 %  BMI: Estimated body mass index is 43.6 kg/m as calculated from the following:   Height as of this encounter: 5\' 4"  (1.626 m).   Weight as of this encounter: 254 lb (115.2 kg).  Risk Assessment: Allergies: Reviewed. She is allergic to amitriptyline; baclofen; duloxetine hcl; gabapentin; hydrocodone-acetaminophen; magnesium salicylate; monosodium glutamate; other; tizanidine; alprazolam; rizatriptan; vicodin [hydrocodone-acetaminophen]; modafinil; adhesive [tape]; ketorolac tromethamine; lamotrigine; and tramadol.  Allergy Precautions: None required Coagulopathies: Reviewed. None identified.  Blood-thinner therapy: None at this time Active Infection(s): Reviewed. None identified. Emily [name] is afebrile  Site Confirmation: Emily [name] was asked to confirm the procedure and laterality before marking the site Procedure checklist: Completed Consent: Before the procedure and under the influence of no sedative(s), amnesic(s), or anxiolytics, the patient was informed of the treatment options, risks and possible complications. To fulfill our ethical and legal obligations, as recommended by the American Medical Association's Code of Ethics, I have  informed the patient of my clinical impression; the nature and purpose of the treatment or procedure; the risks, benefits, and possible complications of the intervention; the alternatives, including doing nothing; the risk(s) and benefit(s) of the alternative treatment(s) or procedure(s); and the risk(s) and benefit(s) of doing nothing.  The patient was provided information about the general risks and possible complications associated with the procedure. These may include, but are not limited to: failure to achieve desired goals, infection, bleeding, organ or nerve damage, allergic reactions, paralysis, and death. In addition, the patient was informed of those risks and complications associated to Spine-related procedures, such as failure to decrease pain; infection (i.e.: Meningitis, epidural or intraspinal abscess); bleeding (i.e.: epidural hematoma, subarachnoid hemorrhage, or any other type of intraspinal or peri-dural bleeding); organ or nerve damage (i.e.: Any type of peripheral nerve, nerve root, or spinal cord injury) with subsequent damage to sensory, motor, and/or autonomic systems, resulting in permanent pain, numbness, and/or weakness of one or several areas of the body; allergic reactions; (i.e.: anaphylactic reaction); and/or death. Furthermore, the patient was informed of those risks and complications associated with the medications. These include, but are not limited to: allergic reactions (i.e.: anaphylactic or anaphylactoid reaction(s)); adrenal axis suppression; blood sugar elevation that in diabetics may result in ketoacidosis or comma; water retention that in patients with history of congestive heart failure may result in shortness of breath, pulmonary edema, and decompensation with resultant heart failure; weight gain; swelling or edema; medication-induced neural toxicity; particulate matter embolism and blood vessel occlusion with resultant organ, and/or nervous system infarction; and/or  aseptic necrosis of one or more joints. Finally, the patient was informed that Medicine is not an exact science; therefore, there is also the possibility of unforeseen or unpredictable risks and/or possible complications that may result in a catastrophic outcome. The patient indicated having understood very clearly. We have given the patient no guarantees and we have made no promises. Enough time was given to the patient to ask questions, all of which were answered to the patient's satisfaction. Emily [name] has indicated that she wanted to continue with the procedure. Attestation: I, the ordering provider, attest that I have discussed with the patient the benefits, risks, side-effects, alternatives, likelihood of achieving goals, and potential problems during recovery for the procedure that I have provided informed consent. Date   Time: 03/24/2018  9:55 AM  Pre-Procedure Preparation:  Monitoring: As per clinic protocol. Respiration, ETCO2, SpO2, BP, heart rate and rhythm monitor placed and checked for adequate function Safety Precautions: Patient was assessed for positional comfort and pressure points before starting the procedure. Time-out: I initiated and conducted the "Time-out" before starting the procedure, as per protocol. The patient was asked to participate by confirming the accuracy of the "Time Out" information. Verification of the correct person, site, and procedure were performed and confirmed by me, the nursing staff, and the patient. "Time-out" conducted as per Joint Commission's Universal Protocol (UP.01.01.01). Time: 1109  Description of Procedure:          Laterality: Bilateral. The procedure was performed in identical fashion on both sides. Levels:  L2, L3, L4, L5, & S1 Medial Branch Level(s) Area Prepped: Posterior Lumbosacral Region Prepping solution: ChloraPrep (2% chlorhexidine gluconate and 70% isopropyl alcohol) Safety Precautions: Aspiration looking for blood return was  conducted prior to all injections. At no point did we inject any substances, as a needle was being advanced. Before injecting, the patient was told to immediately notify me if she was experiencing any new onset of "ringing in the ears, or metallic taste in the mouth". No attempts were made at seeking any paresthesias. Safe injection practices and needle disposal techniques used. Medications properly checked for expiration dates. SDV (single dose vial) medications used. After the completion of the procedure, all disposable equipment  used was discarded in the proper designated medical waste containers. Local Anesthesia: Protocol guidelines were followed. The patient was positioned over the fluoroscopy table. The area was prepped in the usual manner. The time-out was completed. The target area was identified using fluoroscopy. A 12-in long, straight, sterile hemostat was used with fluoroscopic guidance to locate the targets for each level blocked. Once located, the skin was marked with an approved surgical skin marker. Once all sites were marked, the skin (epidermis, dermis, and hypodermis), as well as deeper tissues (fat, connective tissue and muscle) were infiltrated with a small amount of a short-acting local anesthetic, loaded on a 10cc syringe with a 25G, 1.5-in  Needle. An appropriate amount of time was allowed for local anesthetics to take effect before proceeding to the next step. Local Anesthetic: Lidocaine 2.0% The unused portion of the local anesthetic was discarded in the proper designated containers. Technical explanation of process:  L2 Medial Branch Nerve Block (MBB): The target area for the L2 medial branch is at the junction of the postero-lateral aspect of the superior articular process and the superior, posterior, and medial edge of the transverse process of L3. Under fluoroscopic guidance, a Quincke needle was inserted until contact was made with os over the superior postero-lateral aspect of  the pedicular shadow (target area). After negative aspiration for blood, 0.5 mL of the nerve block solution was injected without difficulty or complication. The needle was removed intact. L3 Medial Branch Nerve Block (MBB): The target area for the L3 medial branch is at the junction of the postero-lateral aspect of the superior articular process and the superior, posterior, and medial edge of the transverse process of L4. Under fluoroscopic guidance, a Quincke needle was inserted until contact was made with os over the superior postero-lateral aspect of the pedicular shadow (target area). After negative aspiration for blood, 0.5 mL of the nerve block solution was injected without difficulty or complication. The needle was removed intact. L4 Medial Branch Nerve Block (MBB): The target area for the L4 medial branch is at the junction of the postero-lateral aspect of the superior articular process and the superior, posterior, and medial edge of the transverse process of L5. Under fluoroscopic guidance, a Quincke needle was inserted until contact was made with os over the superior postero-lateral aspect of the pedicular shadow (target area). After negative aspiration for blood, 0.5 mL of the nerve block solution was injected without difficulty or complication. The needle was removed intact. L5 Medial Branch Nerve Block (MBB): The target area for the L5 medial branch is at the junction of the postero-lateral aspect of the superior articular process and the superior, posterior, and medial edge of the sacral ala. Under fluoroscopic guidance, a Quincke needle was inserted until contact was made with os over the superior postero-lateral aspect of the pedicular shadow (target area). After negative aspiration for blood, 0.5 mL of the nerve block solution was injected without difficulty or complication. The needle was removed intact. S1 Medial Branch Nerve Block (MBB): The target area for the S1 medial branch is at the  posterior and inferior 6 o'clock position of the L5-S1 facet joint. Under fluoroscopic guidance, the Quincke needle inserted for the L5 MBB was redirected until contact was made with os over the inferior and postero aspect of the sacrum, at the 6 o' clock position under the L5-S1 facet joint (Target area). After negative aspiration for blood, 0.5 mL of the nerve block solution was injected without difficulty or complication. The needle  was removed intact.  Nerve block solution: 0.2% PF-Ropivacaine + Triamcinolone (40 mg/mL) diluted to a final concentration of 4 mg of Triamcinolone/mL of Ropivacaine The unused portion of the solution was discarded in the proper designated containers. Procedural Needles: 22-gauge, 7-inch, Quincke needles used for all levels.  Once the entire procedure was completed, the treated area was cleaned, making sure to leave some of the prepping solution back to take advantage of its long term bactericidal properties.   Illustration of the posterior view of the lumbar spine and the posterior neural structures. Laminae of L2 through S1 are labeled. DPRL5, dorsal primary ramus of L5; DPRS1, dorsal primary ramus of S1; DPR3, dorsal primary ramus of L3; FJ, facet (zygapophyseal) joint L3-L4; I, inferior articular process of L4; LB1, lateral branch of dorsal primary ramus of L1; IAB, inferior articular branches from L3 medial branch (supplies L4-L5 facet joint); IBP, intermediate branch plexus; MB3, medial branch of dorsal primary ramus of L3; NR3, third lumbar nerve root; S, superior articular process of L5; SAB, superior articular branches from L4 (supplies L4-5 facet joint also); TP3, transverse process of L3.  Vitals:   03/24/18 1124 03/24/18 1134 03/24/18 1144 03/24/18 1154  BP: (!) 121/91 (!) 150/89 125/82 111/80  Pulse:      Resp: 16 17 (!) 24 13  Temp:  98.5 F (36.9 C)  98.3 F (36.8 C)  TempSrc:      SpO2: 99% 99% 99% 98%  Weight:      Height:         Start Time:  1109 hrs. End Time: 1124 hrs.  Imaging Guidance (Spinal):          Type of Imaging Technique: Fluoroscopy Guidance (Spinal) Indication(s): Assistance in needle guidance and placement for procedures requiring needle placement in or near specific anatomical locations not easily accessible without such assistance. Exposure Time: Please see nurses notes. Contrast: None used. Fluoroscopic Guidance: I was personally present during the use of fluoroscopy. "Tunnel Vision Technique" used to obtain the best possible view of the target area. Parallax error corrected before commencing the procedure. "Direction-depth-direction" technique used to introduce the needle under continuous pulsed fluoroscopy. Once target was reached, antero-posterior, oblique, and lateral fluoroscopic projection used confirm needle placement in all planes. Images permanently stored in EMR. Interpretation: No contrast injected. I personally interpreted the imaging intraoperatively. Adequate needle placement confirmed in multiple planes. Permanent images saved into the patient's record.  Antibiotic Prophylaxis:   Anti-infectives (From admission, onward)   None     Indication(s): None identified  Post-operative Assessment:  Post-procedure Vital Signs:  Pulse/HCG Rate: 7780 Temp: 98.3 F (36.8 C) Resp: 13 BP: 111/80 SpO2: 98 %  EBL: None  Complications: No immediate post-treatment complications observed by team, or reported by patient.  Note: The patient tolerated the entire procedure well. A repeat set of vitals were taken after the procedure and the patient was kept under observation following institutional policy, for this type of procedure. Post-procedural neurological assessment was performed, showing return to baseline, prior to discharge. The patient was provided with post-procedure discharge instructions, including a section on how to identify potential problems. Should any problems arise concerning this procedure,  the patient was given instructions to immediately contact us, at any time, without hesitation. In any case, we plan to contact the patient by telephone for a follow-up status report regarding this interventional procedure.  Comments:  No additional relevant information.  Plan of Care  Orders:  Orders Placed This Encounter  Procedures  LUMBAR FACET(MEDIAL BRANCH NERVE BLOCK) MBNB    Scheduling Instructions:     Side: Bilateral     Level: L3-4, L4-5, & L5-S1 Facets (L2, L3, L4, L5, & S1 Medial Branch Nerves)     Sedation: With Sedation.     Timeframe: Today    Order Specific Question:   Where will this procedure be performed?    Answer:   [facility]   DG C-Arm 1-60 Min-No Report    Intraoperative interpretation by procedural physician at [place].    Standing Status:   Standing    Number of Occurrences:   1    Order Specific Question:   Reason for exam:    Answer:   Assistance in needle guidance and placement for procedures requiring needle placement in or near specific anatomical locations not easily accessible without such assistance.   Provider attestation of informed consent for procedure/surgical case    I, the ordering provider, attest that I have discussed with the patient the benefits, risks, side effects, alternatives, likelihood of achieving goals and potential problems during recovery for the procedure that I have provided informed consent.   Informed Consent Details: Transcribe to consent form and obtain patient signature    Surgeon: Ernestene Coover A. Dossie Arbour, MD    Scheduling Instructions:     Procedure: Diagnostic, Bilateral, Lumbar facet block under fluoroscopic guidance. (See notes for level(s).)     Indications: Chronic low back pain secondary to lumbar facet syndrome and primary osteoarthritis of the lumbar spine   Medications ordered for procedure: Meds ordered this encounter  Medications   lidocaine (XYLOCAINE) 2 % (with pres) injection 400  mg   midazolam (VERSED) 5 MG/5ML injection 1-2 mg    Make sure Flumazenil is available in the pyxis when using this medication. If oversedation occurs, administer 0.2 mg IV over 15 sec. If after 45 sec no response, administer 0.2 mg again over 1 min; may repeat at 1 min intervals; not to exceed 4 doses (1 mg)   fentaNYL (SUBLIMAZE) injection 25-50 mcg    Make sure Narcan is available in the pyxis when using this medication. In the event of respiratory depression (RR< 8/min): Titrate NARCAN (naloxone) in increments of 0.1 to 0.2 mg IV at 2-3 minute intervals, until desired degree of reversal.   lactated ringers infusion 1,000 mL   glycopyrrolate (ROBINUL) injection 0.2 mg   diphenhydrAMINE (BENADRYL) injection 25 mg   ropivacaine (PF) 2 mg/mL (0.2%) (NAROPIN) injection 18 mL   triamcinolone acetonide (KENALOG-40) injection 80 mg   Medications administered: We administered lidocaine, midazolam, fentaNYL, lactated ringers, glycopyrrolate, diphenhydrAMINE, ropivacaine (PF) 2 mg/mL (0.2%), and triamcinolone acetonide.  See the medical record for exact dosing, route, and time of administration.  Disposition: Discharge home  Discharge Date & Time: 03/24/2018; 1155 hrs.   Follow-up plan:   Return for PPE (2 wks) w/ Dr. Dossie Arbour.     Future Appointments  Date Time Provider [place]  03/27/2018 10:30 AM Pieter Partridge, DO [facility] None  03/31/2018  8:00 AM [facility] 1 [facility]  04/15/2018 10:15 AM Milinda Pointer, MD [facility] None  04/16/2018  1:00 PM Charlcie Cradle, MD [facility] None  05/11/2018 11:30 AM Annitta Needs, NP [facility]  07/20/2018 10:00 AM Cameron Sprang, MD [facility] None  07/31/2018  8:30 AM Kirtland Bouchard, PhD [facility] None  08/11/2018  1:30 PM Kirtland Bouchard, PhD [facility] None   Primary Care Physician: Monico Blitz, MD Location: [facility]  Pain Management Facility Note by: Gaspar Cola, MD Date: 03/24/2018; Time: 12:32 PM  Disclaimer:    Medicine is not an Chief Strategy Officer. The only guarantee in medicine is that nothing is guaranteed. It is important to note that the decision to proceed with this intervention was based on the information collected from the patient. The Data and conclusions were drawn from the patient's questionnaire, the interview, and the physical examination. Because the information was provided in large part by the patient, it cannot be guaranteed that it has not been purposely or unconsciously manipulated. Every effort has been made to obtain as much relevant data as possible for this evaluation. It is important to note that the conclusions that lead to this procedure are derived in large part from the available data. Always take into account that the treatment will also be dependent on availability of resources and existing treatment guidelines, considered by other Pain Management Practitioners as being common knowledge and practice, at the time of the intervention. For Medico-Legal purposes, it is also important to point out that variation in procedural techniques and pharmacological choices are the acceptable norm. The indications, contraindications, technique, and results of the above procedure should only be interpreted and judged by a Board-Certified Interventional Pain Specialist with extensive familiarity and expertise in the same exact procedure and technique.

## 2018-03-24 NOTE — Patient Instructions (Signed)

## 2018-03-25 ENCOUNTER — Telehealth: Payer: Self-pay

## 2018-03-25 ENCOUNTER — Telehealth: Payer: Self-pay | Admitting: Internal Medicine

## 2018-03-25 NOTE — Telephone Encounter (Signed)
Post procedure phone call.  LM 

## 2018-03-25 NOTE — Telephone Encounter (Signed)
[phone] call nuclear medicine, they need to know if this is an urgent test you wanted the patient to have...they are only able to do things that have been deemed a medical necessity until April.

## 2018-03-25 NOTE — Telephone Encounter (Signed)
Spoke with [facility]. They are rescheduling all non-urgent/emergent tests to end of April. They will reschedule.

## 2018-03-27 ENCOUNTER — Other Ambulatory Visit: Payer: Self-pay

## 2018-03-27 ENCOUNTER — Ambulatory Visit (INDEPENDENT_AMBULATORY_CARE_PROVIDER_SITE_OTHER): Payer: Medicare Other | Admitting: Neurology

## 2018-03-27 DIAGNOSIS — G43709 Chronic migraine without aura, not intractable, without status migrainosus: Secondary | ICD-10-CM

## 2018-03-27 MED ORDER — ONABOTULINUMTOXINA 100 UNITS IJ SOLR
155.0000 [IU] | Freq: Once | INTRAMUSCULAR | Status: AC
  Administered 2018-03-27: 155 [IU] via INTRAMUSCULAR

## 2018-03-27 NOTE — Progress Notes (Signed)
[facility]   Procedure Note Botox  Attending: Dr. Tomi Likens  Preoperative Diagnosis(es): Chronic migraine  Consent obtained from: The patient. Benefits discussed included, but were not limited to decreased muscle tightness, increased joint range of motion, and decreased pain.  Risk discussed included, but were not limited pain and discomfort, bleeding, bruising, excessive weakness, venous thrombosis, muscle atrophy and dysphagia.  Anticipated outcomes of the procedure as well as he risks and benefits of the alternatives to the procedure, and the roles and tasks of the personnel to be involved, were discussed with the patient, and the patient consents to the procedure and agrees to proceed. A copy of the patient medication guide was given to the patient which explains the blackbox warning.  Patients identity and treatment sites confirmed Yes.   Details of Procedure: Skin was cleaned with alcohol. Prior to injection, the needle plunger was aspirated to make sure the needle was not within a blood vessel.  There was no blood retrieved on aspiration.    Following is a summary of the muscles injected  And the amount of Botulinum toxin used:  Dilution 200 units of Botox was reconstituted with 4 ml of preservative free normal saline. Time of reconstitution: At the time of the office visit (<30 minutes prior to injection)   Injections  155 total units of Botox was injected with a 30 gauge needle.  Injection Sites: L occipitalis: 15 units- 3 sites  R occiptalis: 15 units- 3 sites  L upper trapezius: 15 units- 3 sites R upper trapezius: 15 units- 3 sits          L paraspinal: 10 units- 2 sites R paraspinal: 10 units- 2 sites  Face L frontalis(2 injection sites):10 units   R frontalis(2 injection sites):10 units         L corrugator: 5 units   R corrugator: 5 units           Procerus: 5 units   L temporalis: 20 units R temporalis: 20 units   Agent:  200 units of botulinum Type A  (Onobotulinum Toxin type A) was reconstituted with 4 ml of preservative free normal saline.  Time of reconstitution: At the time of the office visit (<30 minutes prior to injection)     Total injected (Units): 155  Total wasted (Units): No waste.  Patient tolerated procedure well without complications.   Reinjection is anticipated in 3 months.

## 2018-03-30 ENCOUNTER — Telehealth: Payer: Self-pay | Admitting: Neurology

## 2018-03-30 ENCOUNTER — Telehealth: Payer: Self-pay | Admitting: Pain Medicine

## 2018-03-30 ENCOUNTER — Telehealth: Payer: Self-pay

## 2018-03-30 ENCOUNTER — Telehealth: Payer: Self-pay | Admitting: *Deleted

## 2018-03-30 DIAGNOSIS — R609 Edema, unspecified: Secondary | ICD-10-CM | POA: Diagnosis not present

## 2018-03-30 DIAGNOSIS — R42 Dizziness and giddiness: Secondary | ICD-10-CM | POA: Diagnosis not present

## 2018-03-30 DIAGNOSIS — H8110 Benign paroxysmal vertigo, unspecified ear: Secondary | ICD-10-CM

## 2018-03-30 DIAGNOSIS — W1839XA Other fall on same level, initial encounter: Secondary | ICD-10-CM | POA: Diagnosis not present

## 2018-03-30 DIAGNOSIS — W19XXXA Unspecified fall, initial encounter: Secondary | ICD-10-CM | POA: Diagnosis not present

## 2018-03-30 DIAGNOSIS — G8929 Other chronic pain: Secondary | ICD-10-CM | POA: Diagnosis not present

## 2018-03-30 DIAGNOSIS — Z79899 Other long term (current) drug therapy: Secondary | ICD-10-CM | POA: Diagnosis not present

## 2018-03-30 DIAGNOSIS — G35 Multiple sclerosis: Secondary | ICD-10-CM | POA: Diagnosis not present

## 2018-03-30 DIAGNOSIS — Z87891 Personal history of nicotine dependence: Secondary | ICD-10-CM | POA: Diagnosis not present

## 2018-03-30 DIAGNOSIS — S93402A Sprain of unspecified ligament of left ankle, initial encounter: Secondary | ICD-10-CM | POA: Diagnosis not present

## 2018-03-30 DIAGNOSIS — S99922A Unspecified injury of left foot, initial encounter: Secondary | ICD-10-CM | POA: Diagnosis not present

## 2018-03-30 DIAGNOSIS — M7732 Calcaneal spur, left foot: Secondary | ICD-10-CM | POA: Diagnosis not present

## 2018-03-30 DIAGNOSIS — R52 Pain, unspecified: Secondary | ICD-10-CM | POA: Diagnosis not present

## 2018-03-30 DIAGNOSIS — M25572 Pain in left ankle and joints of left foot: Secondary | ICD-10-CM | POA: Diagnosis not present

## 2018-03-30 MED ORDER — MECLIZINE HCL 25 MG PO TABS: 25.0000 mg | ORAL_TABLET | Freq: Three times a day (TID) | ORAL | 0 refills | Status: DC | PRN

## 2018-03-30 NOTE — Telephone Encounter (Signed)
Pt left VM that she is having a lot of nausea and requesting Marinol during this crisis. This is RMR pt, forwarding to AM to return call and discuss.  Her call back number is [phone].

## 2018-03-30 NOTE — Telephone Encounter (Signed)
Pt called and is At the ER after falling, she thinks her ankle is broke, Wants Dr Dossie Arbour to be aware incase they give her pain meds.

## 2018-03-30 NOTE — Telephone Encounter (Signed)
Noted. Pt is aware of EG's recommendations. Pt will f/u with Neurology.

## 2018-03-30 NOTE — Telephone Encounter (Signed)
Patient wanted you to know she fell and is in [facility] thinking she broke her ankle from being dizzy. Please advise. Thanks!

## 2018-03-30 NOTE — Telephone Encounter (Signed)
noted 

## 2018-03-30 NOTE — Telephone Encounter (Signed)
This sounds like vertigo. She should really follow-up with neurology. Traditional anti-nausea medications don't typically help. I will send in Meclizine 25 mg to take 3 times daily to help her until she can speak with neurology.

## 2018-03-30 NOTE — Telephone Encounter (Signed)
Sumatriptan not working. Patient is wanting something for her migraine. Anything other than migraine meds then you would have to call Pain Management for permission with the contrast signed. She is still using pharm on file. Please call her. Thanks!

## 2018-03-30 NOTE — Telephone Encounter (Signed)
Pt wanted to let Dr. Dossie Arbour know that she had to call her neurologist about being dizzy and having vertigo and they may be writing her a different medication.

## 2018-03-30 NOTE — Telephone Encounter (Signed)
Spoke with pt. Pt is having some Vertigo and nausea. Pt called her Neurologist yesterday and left a message for them today. Pt is currently taking Prilosec 20 mg bid and had a lefter over pill called Compazine that she took. Pt said her symptoms feel a lot different from before. Pt states that this has been going on for 2 days and she's not able to function correctly to take care of her two year old. She is holding on to the walls when she walks due to the dizziness and nausea.

## 2018-03-30 NOTE — Addendum Note (Signed)
Addended by: Gordy Levan, ERIC A on: 03/30/2018 11:03 AM   Modules accepted: Orders

## 2018-03-30 NOTE — Telephone Encounter (Signed)
Patient is calling in about the after hours paper. She said she thinks this is involving her MS. She is experiencing Vertigo and dizziness. Not sure if it is a reaction to the botox but she's never had a reaction before. Please call her back at [phone]. Thanks!

## 2018-03-31 ENCOUNTER — Other Ambulatory Visit (HOSPITAL_COMMUNITY): Payer: Self-pay

## 2018-03-31 NOTE — Telephone Encounter (Signed)
Noted  

## 2018-04-02 ENCOUNTER — Telehealth: Payer: Self-pay | Admitting: Pain Medicine

## 2018-04-02 NOTE — Telephone Encounter (Signed)
Pt returned Dena's call.

## 2018-04-02 NOTE — Telephone Encounter (Signed)
Patient is about to run out of medications , Lyrica and methacarbamol, also foot is swelled up pretty bad. Wants to know what she needs to do about this. Please call

## 2018-04-02 NOTE — Telephone Encounter (Signed)
Patient wanted to let you know that she feel the other day and hurt her left ankle. She did go to the hospital. It is not broke. She states that she was doing something for daughter and got dizzy and fell

## 2018-04-02 NOTE — Telephone Encounter (Signed)
Patient wants to know if Dr. Dossie Arbour will take over her Lyrica and Methocarbomal. Instructed patient Dr. Dossie Arbour would return on Monday 04/06/2018 and I will ask him then.

## 2018-04-02 NOTE — Telephone Encounter (Signed)
We have not prescribed Lyrica since 04-30-17. Methocarbamol as prescribed 03-02-18, to last 3 months.  Attempted to call patient, message left.

## 2018-04-02 NOTE — Telephone Encounter (Signed)
Patient is calling in about having 2 seizures where she is staring into space.Marland KitchenMarland KitchenMarland KitchenShe said this is how a seizure can happen. She wanted to let you know. Please call her back at [phone]. Thanks!

## 2018-04-02 NOTE — Telephone Encounter (Signed)
Attempted to call, message left. 

## 2018-04-06 ENCOUNTER — Telehealth: Payer: Self-pay | Admitting: Pain Medicine

## 2018-04-06 DIAGNOSIS — Z87891 Personal history of nicotine dependence: Secondary | ICD-10-CM | POA: Diagnosis not present

## 2018-04-06 DIAGNOSIS — Z299 Encounter for prophylactic measures, unspecified: Secondary | ICD-10-CM | POA: Diagnosis not present

## 2018-04-06 DIAGNOSIS — F419 Anxiety disorder, unspecified: Secondary | ICD-10-CM | POA: Diagnosis not present

## 2018-04-06 DIAGNOSIS — G43909 Migraine, unspecified, not intractable, without status migrainosus: Secondary | ICD-10-CM | POA: Diagnosis not present

## 2018-04-06 DIAGNOSIS — I10 Essential (primary) hypertension: Secondary | ICD-10-CM | POA: Diagnosis not present

## 2018-04-06 DIAGNOSIS — Z6841 Body Mass Index (BMI) 40.0 and over, adult: Secondary | ICD-10-CM | POA: Diagnosis not present

## 2018-04-06 DIAGNOSIS — S93409A Sprain of unspecified ligament of unspecified ankle, initial encounter: Secondary | ICD-10-CM | POA: Diagnosis not present

## 2018-04-06 NOTE — Telephone Encounter (Signed)
Dr. Arsenio Katz at [facility] [phone]. Called stating patient is in her office with badly swollen foot. She is going to give patient a couple days of medications for pain. Can call her office for further details.

## 2018-04-06 NOTE — Telephone Encounter (Signed)
Noted  

## 2018-04-07 NOTE — Progress Notes (Signed)
Primary Care Physician:  Monico Blitz, MD  Primary GI: Dr. Gala Romney   Virtual Visit via Telephone Note Due to COVID-19, visit is conducted virtually and was requested by patient.   I connected with Treva L Vancleve on 04/08/18 at  2:30 PM EDT by telephone and verified that I am speaking with the correct person using two identifiers.   I discussed the limitations, risks, security and privacy concerns of performing an evaluation and management service by telephone and the availability of in person appointments. I also discussed with the patient that there may be a patient responsible charge related to this service. The patient expressed understanding and agreed to proceed.  Chief Complaint  Patient presents with  . Follow-up  . Nausea    pt is still nauseated. pt decreased what she eats and states there is no weaight loss or gain.     History of Present Illness: Emily [name] is a 38 year old female with history of chronic nausea. Recently seen at [facility] 03/23/2018. Has been on Zofran, Reglan, Compazine, Phenergan. Remote history of EGD at [facility] about 15 years ago. Placed on Prilosec BID. GES ordered and upcoming at end of month.   Not losing weight. Has a 66-year-old and when cooking, makes her feel sick. Has been nauseated since diagnosed with MS. Wants to know if she can try Marinol. Has tried Zofran 30 minutes before eating and right before bed. Not helpful. Failed all other medications. Had spoken to a nurse and wondered about Marinol. Occasional dry heaves. No vomiting. No abdominal pain. No dysphagia.   Past Medical History:  Diagnosis Date  . Anxiety   . Asthma as a child  . Chronic back pain   . Chronic pain   . Conversion disorder   . Depression   . GERD (gastroesophageal reflux disease)   . Headache(784.0)    migraines  . HTN (hypertension) 02/27/2015  . JC virus antibody positive   . Migraines   . MS (multiple sclerosis) ([place])    06-2006  . Multiple sclerosis  ([place])   . Ovarian cyst   . Panic attack   . Perforated bowel ([place]) 2009  . Pseudoseizures   . PTSD (post-traumatic stress disorder)   . S/P emergency C-section   . Urinary urgency      Past Surgical History:  Procedure Laterality Date  . ABDOMINAL SURGERY    . APPENDECTOMY    . BOWEL RESECTION  01/2007   with colostomy  . CESAREAN SECTION N/A 07/12/2015   Procedure: CESAREAN SECTION;  Surgeon: Guss Bunde, MD;  Location: [place];  Service: Obstetrics;  Laterality: N/A;  . COLONOSCOPY WITH PROPOFOL N/A 01/29/2017   Procedure: COLONOSCOPY WITH PROPOFOL;  Surgeon: Ileana Roup, MD;  Location: [facility];  Service: General;  Laterality: N/A;  . COLOSTOMY CLOSURE  04/2007  . EXTREMITY CYST EXCISION  1994   right leg  . HERNIA REPAIR    . INCISIONAL HERNIA REPAIR N/A 02/16/2016   Procedure: HERNIA REPAIR INCISIONAL;  Surgeon: Fanny Skates, MD;  Location: [facility];  Service: General;  Laterality: N/A;  . INSERTION OF MESH  02/16/2016   Procedure: INSERTION OF MESH;  Surgeon: Fanny Skates, MD;  Location: [facility];  Service: General;;  . RADIOLOGY WITH ANESTHESIA N/A 03/06/2017   Procedure: MRI WITH ANESTHESIA OF CERVICAL SPINE WITHOUT CONTRAST, MRI OF LUMBAR SPINE WITHOUT CONTRAST;  Surgeon: Radiologist, Medication, MD;  Location: [place];  Service: Radiology;  Laterality: N/A;  .  SCAR REVISION  01/21/2011   Procedure: SCAR REVISION;  Surgeon: Hermelinda Dellen;  Location: [place];  Service: Plastics;  Laterality: N/A;  exploration of scar of abdomen and repair of defect  . TOOTH EXTRACTION Left 10/2016     Current Meds  Medication Sig  . albuterol (PROVENTIL HFA;VENTOLIN HFA) 108 (90 Base) MCG/ACT inhaler Inhale 2 puffs into the lungs every 6 (six) hours as needed for wheezing or shortness of breath. (Patient taking differently: Inhale 2 puffs into the lungs every 4 (four) hours as needed for wheezing or shortness of breath. )  . amLODipine (NORVASC) 5  MG tablet Take 5 mg by mouth every evening.  . busPIRone (BUSPAR) 15 MG tablet Take 1 tablet (15 mg total) by mouth 3 (three) times daily.  . diazepam (VALIUM) 5 MG tablet Take 1 tablet (5 mg total) by mouth 2 (two) times daily as needed for anxiety.  . divalproex (DEPAKOTE) 500 MG DR tablet Take 1 tablet (500 mg total) by mouth at bedtime.  Marland Kitchen EPINEPHrine (EPIPEN) 0.3 mg/0.3 mL SOAJ injection Inject 0.3 mg into the muscle as needed (allergic reaction). Reported on 03/28/2015  . etonogestrel (NEXPLANON) 68 MG IMPL implant 1 each by Subdermal route once.  Marland Kitchen FLUoxetine (PROZAC) 40 MG capsule Take 2 capsules (80 mg total) by mouth daily.  Marland Kitchen lithium carbonate (LITHOBID) 300 MG CR tablet Take 2 tablets (600 mg total) by mouth at bedtime.  Marland Kitchen loratadine (CLARITIN) 10 MG tablet Take 10 mg by mouth daily as needed for allergies.  Marland Kitchen meclizine (ANTIVERT) 25 MG tablet Take 1 tablet (25 mg total) by mouth 3 (three) times daily as needed for dizziness.  . methocarbamol (ROBAXIN) 750 MG tablet Take 750 mg by mouth 2 (two) times daily.  . Multiple Vitamins-Minerals (MULTIVITAMIN ADULT PO) Take 1 tablet by mouth daily.   Marland Kitchen omeprazole (PRILOSEC) 20 MG capsule Take 1 capsule (20 mg total) by mouth 2 (two) times daily before a meal.  . pregabalin (LYRICA) 100 MG capsule Take 1 capsule (100 mg total) by mouth 3 (three) times daily.  . prochlorperazine (COMPAZINE) 10 MG tablet Take 1 tablet (10 mg total) by mouth every 6 (six) hours as needed for nausea or vomiting.  . ranitidine (ZANTAC) 300 MG tablet Take 300 mg by mouth at bedtime.   . topiramate (TOPAMAX) 100 MG tablet Take 1.5 tablets twice a day   Current Facility-Administered Medications for the 04/08/18 encounter (Office Visit) with Annitta Needs, NP  Medication  . metoCLOPramide (REGLAN) 10 mg in dextrose 5 % 50 mL IVPB       Observations/Objective: No distress. Unable to perform physical exam due to telephone encounter. No video available.   Assessment  and Plan: 38 year old female with chronic nausea, failing multiple anti-emetics. Affecting quality of life. Currently scheduled for GES in near future. May ultimately need EGD electively. Continue omeprazole BID for now. As she has failed multiple agents, will trial Marinol 2.5 mg BID.   Follow Up Instructions: Keep appt for GES Marinol 2.5 mg po BID Consider EGD    I discussed the assessment and treatment plan with the patient. The patient was provided an opportunity to ask questions and all were answered. The patient agreed with the plan and demonstrated an understanding of the instructions.   The patient was advised to call back or seek an in-person evaluation if the symptoms worsen or if the condition fails to improve as anticipated.  I provided 12 minutes of non-face-to-face  time during this encounter.  Annitta Needs, PhD, ANP-BC [facility]

## 2018-04-08 ENCOUNTER — Other Ambulatory Visit: Payer: Self-pay

## 2018-04-08 ENCOUNTER — Ambulatory Visit (INDEPENDENT_AMBULATORY_CARE_PROVIDER_SITE_OTHER): Payer: Medicare Other | Admitting: Gastroenterology

## 2018-04-08 ENCOUNTER — Encounter: Payer: Self-pay | Admitting: Gastroenterology

## 2018-04-08 DIAGNOSIS — R110 Nausea: Secondary | ICD-10-CM | POA: Diagnosis not present

## 2018-04-08 MED ORDER — DRONABINOL 2.5 MG PO CAPS: 2.5000 mg | ORAL_CAPSULE | Freq: Two times a day (BID) | ORAL | 3 refills | Status: DC

## 2018-04-08 NOTE — Patient Instructions (Signed)
I have sent in Marinol to take twice a day.   Please keep appointment for gastric emptying study. You may need to have an upper endoscopy completed.  Continue omeprazole (Prilosec) twice a day, 30 minutes before breakfast and dinner.   Further recommendations to follow!  It was a pleasure to talk with you today. I strive to create trusting relationships with patients to provide genuine, compassionate, and quality care. I value your feedback. If you receive a survey regarding your visit,  I greatly appreciate you taking time to fill this out.   Annitta Needs, PhD, ANP-BC [facility]

## 2018-04-09 ENCOUNTER — Other Ambulatory Visit: Payer: Self-pay

## 2018-04-09 ENCOUNTER — Encounter (HOSPITAL_COMMUNITY): Payer: Self-pay | Admitting: Psychiatry

## 2018-04-09 ENCOUNTER — Ambulatory Visit (INDEPENDENT_AMBULATORY_CARE_PROVIDER_SITE_OTHER): Payer: Medicare Other | Admitting: Psychiatry

## 2018-04-09 DIAGNOSIS — G43611 Persistent migraine aura with cerebral infarction, intractable, with status migrainosus: Secondary | ICD-10-CM | POA: Diagnosis not present

## 2018-04-09 DIAGNOSIS — F411 Generalized anxiety disorder: Secondary | ICD-10-CM | POA: Diagnosis not present

## 2018-04-09 DIAGNOSIS — F445 Conversion disorder with seizures or convulsions: Secondary | ICD-10-CM

## 2018-04-09 DIAGNOSIS — Z87891 Personal history of nicotine dependence: Secondary | ICD-10-CM

## 2018-04-09 DIAGNOSIS — F4001 Agoraphobia with panic disorder: Secondary | ICD-10-CM

## 2018-04-09 DIAGNOSIS — F4310 Post-traumatic stress disorder, unspecified: Secondary | ICD-10-CM | POA: Diagnosis not present

## 2018-04-09 DIAGNOSIS — F332 Major depressive disorder, recurrent severe without psychotic features: Secondary | ICD-10-CM | POA: Diagnosis not present

## 2018-04-09 DIAGNOSIS — I639 Cerebral infarction, unspecified: Secondary | ICD-10-CM

## 2018-04-09 MED ORDER — BUSPIRONE HCL 15 MG PO TABS: 15.0000 mg | ORAL_TABLET | Freq: Three times a day (TID) | ORAL | 2 refills | Status: DC

## 2018-04-09 MED ORDER — LITHIUM CARBONATE ER 300 MG PO TBCR: 900.0000 mg | EXTENDED_RELEASE_TABLET | Freq: Every day | ORAL | 2 refills | Status: DC

## 2018-04-09 MED ORDER — DIAZEPAM 5 MG PO TABS: 5.0000 mg | ORAL_TABLET | Freq: Two times a day (BID) | ORAL | 1 refills | Status: DC | PRN

## 2018-04-09 MED ORDER — FLUOXETINE HCL 40 MG PO CAPS: 80.0000 mg | ORAL_CAPSULE | Freq: Every day | ORAL | 2 refills | Status: DC

## 2018-04-09 NOTE — Progress Notes (Signed)
Virtual Visit via Telephone Note  I connected with Emily [name] on 04/09/18 at 11:30 AM EDT by telephone and verified that I am speaking with the correct person using two identifiers.   I discussed the limitations, risks, security and privacy concerns of performing an evaluation and management service by telephone and the availability of in person appointments. I also discussed with the patient that there may be a patient responsible charge related to this service. The patient expressed understanding and agreed to proceed.   History of Present Illness: Patient tells me that she is "hanging in there".  She is feeling good when she is with her daughter but when she is alone she feels depressed and guilty and has a lot of negative thoughts.  She denies any SI/HI.  Her sleep is been variable due to racing thoughts.  When she cannot sleep she will often sit on the couch and watch TV for long periods of time.  Things at home are tense.  Her parents are very sensitive and often angry with her.  Her sister has been especially helpful with talking about the patient's problems and helping her to understand a little bit of the issues from her parents perspective.  She states that her ADD symptoms continue and at this point she has not been able to schedule for testing so she will just wait and deal with poor memory and concentration and losing items and other symptoms.  She does feel that the increased dose of lithium has been helpful.  She finds that she is able to tolerate a little bit more stress and deal with her parents a little bit better.  She has been taking the Valium and the BuSpar for her anxiety and they do seem to help.  She is feeling worried about her health and does worry about being infected with the coronavirus.  Patient is attempting to follow recommended state guidelines but the changes have come so suddenly that it is been hard to deal with.  She is having seizures but it has been a few weeks  since she had one last.   Observations/Objective: I spoke with the patient on the phone.  She was engaging, calm and cooperative.  Her speech and language were within normal limits.  Fund of knowledge is fair.  Mood is anxious and depressed and affect is congruent.  Thought processes are coherent and circumstantial as per her usual.  Thought content is within normal limits.  She does not appear to be responding to internal stimuli.  She denies SI/HI.  Her memory and attention were fair.  Insight is good and judgment is fair.  Follow  Assessment and Plan:  Conversion disorder with pseudoseizures; MDD-recurrent, moderate; GAD; PTSD; Insomnia  I will increase the lithium CR to 900 mg p.o. daily for MDD Continue Prozac 80 mg p.o. daily for MDD, GAD and PTSD Continue BuSpar 15 mg p.o. 3 times daily for GAD and mood augmentation Continue Valium 5 mg p.o. twice daily as needed anxiety and pseudoseizures.  Patient has been taking this medication for several months and denies any side effects or adverse reactions.  She is prescribed Topamax and Depakote by her neurologist.  I encouraged patient to continue therapy.  I reviewed the labs done on 03/16/2018 which showed a CMP with mild increase in chloride but was otherwise normal; vitamin B12 within normal limits; vitamin D low.  Follow Up Instructions: Follow-up with me in 6 weeks or sooner on Jun 03, 2018 at  11:30 AM.   I discussed the assessment and treatment plan with the patient. The patient was provided an opportunity to ask questions and all were answered. The patient agreed with the plan and demonstrated an understanding of the instructions.   The patient was advised to call back or seek an in-person evaluation if the symptoms worsen or if the condition fails to improve as anticipated.  I provided 30 minutes of non-face-to-face time during this encounter.   Charlcie Cradle, MD

## 2018-04-11 ENCOUNTER — Other Ambulatory Visit (HOSPITAL_COMMUNITY): Payer: Self-pay | Admitting: Psychiatry

## 2018-04-11 DIAGNOSIS — F332 Major depressive disorder, recurrent severe without psychotic features: Secondary | ICD-10-CM

## 2018-04-13 DIAGNOSIS — R110 Nausea: Secondary | ICD-10-CM | POA: Insufficient documentation

## 2018-04-14 NOTE — Progress Notes (Signed)
CC'ED TO PCP 

## 2018-04-15 ENCOUNTER — Other Ambulatory Visit: Payer: Self-pay

## 2018-04-15 ENCOUNTER — Ambulatory Visit: Payer: Medicare Other | Attending: Pain Medicine | Admitting: Pain Medicine

## 2018-04-15 DIAGNOSIS — M5441 Lumbago with sciatica, right side: Secondary | ICD-10-CM | POA: Diagnosis not present

## 2018-04-15 DIAGNOSIS — M5442 Lumbago with sciatica, left side: Secondary | ICD-10-CM | POA: Diagnosis not present

## 2018-04-15 DIAGNOSIS — M47816 Spondylosis without myelopathy or radiculopathy, lumbar region: Secondary | ICD-10-CM | POA: Diagnosis not present

## 2018-04-15 DIAGNOSIS — G8929 Other chronic pain: Secondary | ICD-10-CM | POA: Diagnosis not present

## 2018-04-15 NOTE — Patient Instructions (Signed)

## 2018-04-15 NOTE — Progress Notes (Signed)
Pain Management Encounter Note - Virtual Visit via Telephone Telehealth (real-time audio visits between healthcare provider and patient).  Patient's Phone No. & Preferred Pharmacy:  [phone] (home); [phone] (mobile); (Preferred) [phone]  [id number] - [place], [place] [address] [place] [address] Phone: [phone] Fax: [phone]   Pre-screening note:  Our staff contacted Emily [name] and offered her an "in person", "face-to-face" appointment versus a telephone encounter. She indicated preferring the telephone encounter, at this time.  Reason for Virtual Visit: COVID-19*  Social distancing based on CDC and AMA recommendations.   I contacted Emily [name] on 04/15/2018 at 1:23 PM by telephone and clearly identified myself as Gaspar Cola, MD. I verified that I was speaking with the correct person using two identifiers (Name and date of birth: September 05, 1980).  Advanced Informed Consent I sought verbal advanced consent from Emily [name] for telemedicine interactions and virtual visit. I informed Emily [name] of the security and privacy concerns, risks, and limitations associated with performing an evaluation and management service by telephone. I also informed Emily [name] of the availability of "in person" appointments and I informed her of the possibility of a patient responsible charge related to this service. Emily [name] expressed understanding and agreed to proceed.   Historic Elements   Emily [name] is a 38 y.o. year old, female patient evaluated today after her last encounter by our practice on 04/06/2018. Emily [name]  has a past medical history of Anxiety, Asthma (as a child), Chronic back pain, Chronic pain, Conversion disorder, Depression, GERD (gastroesophageal reflux disease), Headache(784.0), HTN (hypertension) (02/27/2015), JC virus antibody positive, Migraines, MS (multiple sclerosis) ([place]), Multiple  sclerosis ([place]), Ovarian cyst, Panic attack, Perforated bowel ([place]) (2009), Pseudoseizures, PTSD (post-traumatic stress disorder), S/P emergency C-section, and Urinary urgency. She also  has a past surgical history that includes Extremity cyst excision (1994); Bowel resection (01/2007); Colostomy closure (04/2007); Scar revision (01/21/2011); Hernia repair; Abdominal surgery; Appendectomy; Cesarean section (N/A, 07/12/2015); Incisional hernia repair (N/A, 02/16/2016); Insertion of mesh (02/16/2016); Tooth Extraction (Left, 10/2016); Colonoscopy with propofol (N/A, 01/29/2017); and Radiology with anesthesia (N/A, 03/06/2017). Emily [name] has a current medication list which includes the following prescription(s): albuterol, amlodipine, buspirone, diazepam, divalproex, dronabinol, epinephrine, etonogestrel, fluoxetine, gilenya, lithium carbonate, loratadine, meclizine, methocarbamol, methocarbamol, multiple vitamins-minerals, omeprazole, pregabalin, prochlorperazine, ranitidine, and topiramate, and the following Facility-Administered Medications: metoCLOPramide (REGLAN) 10 mg in dextrose 5 % 50 mL IVPB and metoclopramide. She  reports that she quit smoking about 2 years ago. Her smoking use included cigarettes. She has a 2.50 pack-year smoking history. She has never used smokeless tobacco. She reports that she does not drink alcohol or use drugs. Emily [name] is allergic to amitriptyline; baclofen; duloxetine hcl; gabapentin; hydrocodone-acetaminophen; magnesium salicylate; monosodium glutamate; other; tizanidine; alprazolam; rizatriptan; vicodin [hydrocodone-acetaminophen]; modafinil; adhesive [tape]; ketorolac tromethamine; lamotrigine; and tramadol.   HPI  I last saw her on 04/06/2018. She is being evaluated for a post-procedure assessment.  Post-Procedure Evaluation  Procedure: Diagnostic bilateral lumbar facet block #1 under fluoroscopic guidance and IV sedation Pre-procedure pain level:  5/10 Post-procedure: 0/10 (100%  relief)  Sedation: Sedation provided.  Effectiveness during initial hour after procedure(Ultra-Short Term Relief): 100 %  Local anesthetic used: Long-acting (4-6 hours) Effectiveness: Defined as any analgesic benefit obtained secondary to the administration of local anesthetics. This carries significant diagnostic value as to the etiological location, or anatomical origin, of the pain. Duration of benefit is expected to  coincide with the duration of the local anesthetic used.  Effectiveness during initial 4-6 hours after procedure(Short-Term Relief): 100 %  Long-term benefit: Defined as any relief past the pharmacologic duration of the local anesthetics.  Effectiveness past the initial 6 hours after procedure(Long-Term Relief): 100 % x 1.5 weeks  Current benefits: Defined as benefit that persist at this time.   Analgesia:  90-100% better Function: Emily [name] reports improvement in function ROM: Emily [name] reports improvement in ROM  Review of recent tests  DG C-Arm 1-60 Min-No Report Fluoroscopy was utilized by the requesting physician.  No radiographic  interpretation.    Office Visit on 03/16/2018  Component Date Value Ref Range Status  . Summary 03/16/2018 FINAL   Final   Comment: ==================================================================== TOXASSURE COMP DRUG ANALYSIS,UR ==================================================================== Test                             Result       Flag       Units Drug Present and Declared for Prescription Verification   Desmethyldiazepam              214          EXPECTED   ng/mg creat   Oxazepam                       1241         EXPECTED   ng/mg creat   Temazepam                      1052         EXPECTED   ng/mg creat    Desmethyldiazepam, oxazepam, and temazepam are expected    metabolites of diazepam. Desmethyldiazepam and oxazepam are also    expected metabolites of other drugs, including chlordiazepoxide,    prazepam,  clorazepate, and halazepam. Oxazepam is an expected    metabolite of temazepam. Oxazepam and temazepam are also    available as scheduled prescription medications.   Pregabalin                     PRESENT      EXPECTED   Topiramate                     PRESENT      EXPECTED   Meth                          ocarbamol                  PRESENT      EXPECTED   Fluoxetine                     PRESENT      EXPECTED   Norfluoxetine                  PRESENT      EXPECTED    Norfluoxetine is an expected metabolite of fluoxetine. Drug Absent but Declared for Prescription Verification   Prochlorperazine               Not Detected UNEXPECTED   Ibuprofen                      Not Detected UNEXPECTED    Ibuprofen, as indicated in the declared medication list,  is not    always detected even when used as directed. ==================================================================== Test                      Result    Flag   Units      Ref Range   Creatinine              29               mg/dL      >=20 ==================================================================== Declared Medications:  The flagging and interpretation on this report are based on the  following declared medications.  Unexpected results may arise from  inaccuracies in the declared medications.  **Note: The testing sco                          pe of this panel includes these medications:  Diazepam (Valium)  Fluoxetine (Prozac)  Methocarbamol (Robaxin)  Pregabalin (Lyrica)  Prochlorperazine (Compazine)  Topiramate (Topamax)  **Note: The testing scope of this panel does not include small to  moderate amounts of these reported medications:  Ibuprofen  **Note: The testing scope of this panel does not include following  reported medications:  Albuterol  Amlodipine (Norvasc)  Buspirone (BuSpar)  Divalproex (Depakote)  Epinephrine (EpiPen)  Etonogestrel (Nexplanon)  Fingolimod (Gilenya)  Lithium (Lithobid)  Loratadine  (Claritin)  Multivitamin  Ranitidine (Zantac) ==================================================================== For clinical consultation, please call [phone]. ====================================================================   . Glucose 03/16/2018 83  65 - 99 mg/dL Final  . BUN 03/16/2018 12  6 - 24 mg/dL Final  . Creatinine, Ser 03/16/2018 0.75  0.57 - 1.00 mg/dL Final  . GFR calc non Af Amer 03/16/2018 100  >59 mL/min/1.73 Final  . GFR calc Af Amer 03/16/2018 115  >59 mL/min/1.73 Final  . BUN/Creatinine Ratio 03/16/2018 16  9 - 23 Final  . Sodium 03/16/2018 142  134 - 144 mmol/L Final  . Potassium 03/16/2018 4.0  3.5 - 5.2 mmol/L Final  . Chloride 03/16/2018 110* 96 - 106 mmol/L Final  . Calcium 03/16/2018 8.6* 8.7 - 10.2 mg/dL Final  . Total Protein 03/16/2018 6.7  6.0 - 8.5 g/dL Final  . Albumin 03/16/2018 4.0  3.8 - 4.8 g/dL Final  . Globulin, Total 03/16/2018 2.7  1.5 - 4.5 g/dL Final  . Albumin/Globulin Ratio 03/16/2018 1.5  1.2 - 2.2 Final  . Bilirubin Total 03/16/2018 0.2  0.0 - 1.2 mg/dL Final  . Alkaline Phosphatase 03/16/2018 96  39 - 117 IU/L Final  . AST 03/16/2018 12  0 - 40 IU/L Final  . Magnesium 03/16/2018 2.2  1.6 - 2.3 mg/dL Final  . Vitamin B-12 03/16/2018 521  232 - 1,245 pg/mL Final  . Sed Rate 03/16/2018 20  0 - 32 mm/hr Final  . 25-Hydroxy, Vitamin D 03/16/2018 20* ng/mL Final   Comment: Reference Range: All Ages: Target levels 30 - 100   . 25-Hydroxy, Vitamin D-2 03/16/2018 4.7  ng/mL Final  . 25-Hydroxy, Vitamin D-3 03/16/2018 15  ng/mL Final  . CRP 03/16/2018 6  0 - 10 mg/L Final   Assessment  The primary encounter diagnosis was Chronic low back pain (Secondary Area of Pain) (Bilateral) (L>R). Diagnoses of Lumbar facet syndrome (Bilateral) (R>L) and Lumbar facet arthropathy were also pertinent to this visit.  Plan of Care  I am having Emily [name] maintain her ranitidine, EPINEPHrine, albuterol, Multiple Vitamins-Minerals  (MULTIVITAMIN ADULT PO), amLODipine, etonogestrel, loratadine, methocarbamol, pregabalin, prochlorperazine, methocarbamol, topiramate,  divalproex, Gilenya, omeprazole, meclizine, dronabinol, busPIRone, diazepam, FLUoxetine, and lithium carbonate. We will continue to administer metoCLOPramide (REGLAN) 10 mg in dextrose 5 % 50 mL IVPB.  Pharmacotherapy (Medications Ordered): No orders of the defined types were placed in this encounter.  Orders:  Orders Placed This Encounter  Procedures  . LUMBAR FACET(MEDIAL BRANCH NERVE BLOCK) MBNB    Standing Status:   Future    Standing Expiration Date:   05/15/2018    Scheduling Instructions:     Side: Bilateral     Level: L3-4, L4-5, & L5-S1 Facets (L2, L3, L4, L5, & S1 Medial Branch Nerves)     Sedation: Patient's choice.     Timeframe: ASAA    Order Specific Question:   Where will this procedure be performed?    Answer:   [facility]   Follow-up plan:   Return for Procedure (w/ sedation): (B) L-FCT BLK #2.   I discussed the assessment and treatment plan with the patient. The patient was provided an opportunity to ask questions and all were answered. The patient agreed with the plan and demonstrated an understanding of the instructions.  Patient advised to call back or seek an in-person evaluation if the symptoms or condition worsens.  Total duration of non-face-to-face encounter: 15 minutes.  Note by: Gaspar Cola, MD Date: 04/15/2018; Time: 1:23 PM  Disclaimer:  * Given the special circumstances of the COVID-19 pandemic, the federal government has announced that [employer] for [employer] ([employer]) will exercise its enforcement discretion and will not impose penalties on physicians using telehealth in the event of noncompliance with regulatory requirements under the [place] and Accountability Act (HIPAA) in connection with the good faith provision of telehealth during the JEHUD-14 national public health emergency.  ([place])

## 2018-04-16 ENCOUNTER — Ambulatory Visit (HOSPITAL_COMMUNITY): Payer: Medicare Other | Admitting: Psychiatry

## 2018-04-21 ENCOUNTER — Encounter (HOSPITAL_BASED_OUTPATIENT_CLINIC_OR_DEPARTMENT_OTHER): Payer: Self-pay

## 2018-04-21 ENCOUNTER — Other Ambulatory Visit: Payer: Self-pay

## 2018-04-25 ENCOUNTER — Other Ambulatory Visit: Payer: Self-pay | Admitting: Pain Medicine

## 2018-04-25 DIAGNOSIS — M792 Neuralgia and neuritis, unspecified: Secondary | ICD-10-CM

## 2018-04-25 MED ORDER — PREGABALIN 100 MG PO CAPS: 100.0000 mg | ORAL_CAPSULE | Freq: Three times a day (TID) | ORAL | 2 refills | Status: DC

## 2018-04-26 ENCOUNTER — Encounter (HOSPITAL_COMMUNITY): Payer: Self-pay | Admitting: Anesthesiology

## 2018-04-26 NOTE — Anesthesia Preprocedure Evaluation (Deleted)
Anesthesia Evaluation  Patient identified by MRN, date of birth, ID band Patient awake    Reviewed: Allergy & Precautions, NPO status , Patient's Chart, lab work & pertinent test results  Airway        Dental   Pulmonary neg pulmonary ROS, former smoker,           Cardiovascular hypertension, negative cardio ROS       Neuro/Psych MS Chronic pain  Neuromuscular disease negative psych ROS   GI/Hepatic Neg liver ROS, GERD  Medicated,  Endo/Other  Morbid obesity  Renal/GU negative Renal ROS  negative genitourinary   Musculoskeletal negative musculoskeletal ROS (+)   Abdominal   Peds negative pediatric ROS (+)  Hematology negative hematology ROS (+)   Anesthesia Other Findings   Reproductive/Obstetrics negative OB ROS                             Anesthesia Physical Anesthesia Plan  ASA: III  Anesthesia Plan: General   Post-op Pain Management:    Induction: Intravenous  PONV Risk Score and Plan: 3 and Ondansetron, Dexamethasone and Treatment may vary due to age or medical condition  Airway Management Planned: LMA and Oral ETT  Additional Equipment:   Intra-op Plan:   Post-operative Plan: Extubation in OR  Informed Consent: I have reviewed the patients History and Physical, chart, labs and discussed the procedure including the risks, benefits and alternatives for the proposed anesthesia with the patient or authorized representative who has indicated his/her understanding and acceptance.     Dental advisory given  Plan Discussed with: CRNA and Surgeon  Anesthesia Plan Comments:         Anesthesia Quick Evaluation

## 2018-04-27 ENCOUNTER — Ambulatory Visit (HOSPITAL_BASED_OUTPATIENT_CLINIC_OR_DEPARTMENT_OTHER): Admission: RE | Admit: 2018-04-27 | Payer: Medicare Other | Source: Home / Self Care | Admitting: Surgery

## 2018-04-27 SURGERY — EXAM UNDER ANESTHESIA, RECTUM
Anesthesia: General

## 2018-04-28 ENCOUNTER — Telehealth: Payer: Self-pay | Admitting: Neurology

## 2018-04-28 ENCOUNTER — Ambulatory Visit (HOSPITAL_COMMUNITY): Payer: Medicare Other

## 2018-04-28 NOTE — Telephone Encounter (Signed)
Patient said she is experiencing a really bad headache. She wants to talk to you about medication and what to do. Please advise. Thanks!

## 2018-04-29 ENCOUNTER — Other Ambulatory Visit: Payer: Self-pay | Admitting: Neurology

## 2018-04-29 ENCOUNTER — Telehealth: Payer: Self-pay

## 2018-04-29 MED ORDER — PREDNISONE 20 MG PO TABS: ORAL_TABLET | ORAL | 0 refills | Status: DC

## 2018-04-29 NOTE — Telephone Encounter (Signed)
Pls let her know I sent in a week course of Prednisone to hopefully break the headache cycle. Instructions will be on the bottle. Thanks

## 2018-04-29 NOTE — Telephone Encounter (Signed)
Pt called no answer voice mail left for pt to call back to schedule a virtual appointment to talk about new migraine medication options. Pt also informed in voice mail that if she needed to still come in tomorrow to get her migraine cocktail she can.

## 2018-04-29 NOTE — Telephone Encounter (Signed)
Pls ask her if she can come in for a migraine cocktail, Lovey Newcomer can help with this. Thanks!

## 2018-04-30 ENCOUNTER — Ambulatory Visit: Payer: Medicare Other

## 2018-04-30 ENCOUNTER — Other Ambulatory Visit (INDEPENDENT_AMBULATORY_CARE_PROVIDER_SITE_OTHER): Payer: Medicare Other

## 2018-04-30 ENCOUNTER — Other Ambulatory Visit: Payer: Self-pay

## 2018-04-30 DIAGNOSIS — G43709 Chronic migraine without aura, not intractable, without status migrainosus: Secondary | ICD-10-CM | POA: Diagnosis not present

## 2018-04-30 MED ORDER — SUMATRIPTAN SUCCINATE 3 MG/0.5ML ~~LOC~~ SOAJ: 3.0000 mg | Freq: Once | SUBCUTANEOUS | 0 refills | Status: DC

## 2018-04-30 MED ORDER — DIPHENHYDRAMINE HCL 50 MG/ML IJ SOLN
25.0000 mg | Freq: Once | INTRAMUSCULAR | Status: AC
  Administered 2018-04-30: 25 mg via INTRAVENOUS

## 2018-04-30 MED ORDER — METOCLOPRAMIDE HCL 5 MG/ML IJ SOLN
10.0000 mg | Freq: Once | INTRAMUSCULAR | Status: AC
  Administered 2018-04-30: 10 mg via INTRAMUSCULAR

## 2018-05-04 ENCOUNTER — Other Ambulatory Visit: Payer: Self-pay | Admitting: Surgery

## 2018-05-04 ENCOUNTER — Other Ambulatory Visit (HOSPITAL_COMMUNITY): Payer: Self-pay | Admitting: Surgery

## 2018-05-05 ENCOUNTER — Telehealth (INDEPENDENT_AMBULATORY_CARE_PROVIDER_SITE_OTHER): Payer: Medicare Other | Admitting: Neurology

## 2018-05-05 ENCOUNTER — Other Ambulatory Visit: Payer: Self-pay

## 2018-05-05 ENCOUNTER — Encounter: Payer: Self-pay | Admitting: Neurology

## 2018-05-05 VITALS — Ht 64.0 in | Wt 260.0 lb

## 2018-05-05 DIAGNOSIS — G43709 Chronic migraine without aura, not intractable, without status migrainosus: Secondary | ICD-10-CM | POA: Diagnosis not present

## 2018-05-05 DIAGNOSIS — F445 Conversion disorder with seizures or convulsions: Secondary | ICD-10-CM

## 2018-05-05 DIAGNOSIS — G43109 Migraine with aura, not intractable, without status migrainosus: Secondary | ICD-10-CM

## 2018-05-05 DIAGNOSIS — G35 Multiple sclerosis: Secondary | ICD-10-CM

## 2018-05-05 MED ORDER — DIVALPROEX SODIUM 500 MG PO DR TAB: 500.0000 mg | DELAYED_RELEASE_TABLET | Freq: Every day | ORAL | 3 refills | Status: DC

## 2018-05-05 MED ORDER — ERENUMAB-AOOE 140 MG/ML ~~LOC~~ SOAJ: 1.0000 "pen " | SUBCUTANEOUS | 11 refills | Status: DC

## 2018-05-05 MED ORDER — TOPIRAMATE 100 MG PO TABS: ORAL_TABLET | ORAL | 3 refills | Status: DC

## 2018-05-05 MED ORDER — FINGOLIMOD HCL 0.5 MG PO CAPS: 0.5000 mg | ORAL_CAPSULE | Freq: Every day | ORAL | 11 refills | Status: DC

## 2018-05-05 NOTE — Progress Notes (Signed)
Virtual Visit via Video Note The purpose of this virtual visit is to provide medical care while limiting exposure to the novel coronavirus.    Consent was obtained for video visit:  Yes.   Answered questions that patient had about telehealth interaction:  Yes.   I discussed the limitations, risks, security and privacy concerns of performing an evaluation and management service by telemedicine. I also discussed with the patient that there may be a patient responsible charge related to this service. The patient expressed understanding and agreed to proceed.  Pt location: Home Physician Location: office Name of referring provider:  Monico Blitz, MD I connected with [name] at patients initiation/request on 05/05/2018 at 10:30 AM EDT by video enabled telemedicine application and verified that I am speaking with the correct person using two identifiers. Pt MRN:  [id number] Pt DOB:  1980-06-01 Video Participants:  Emily [name]   History of Present Illness:  The patient was last seen in November 2019 for MS and migraines. She also has psychogenic non-epileptic shaking spells. She has several psychological issues as well, including anxiety, depression, chronic pain. Since her last visit, she has called our office several times for different symptoms. She was having dizziness/vertigo and was concerned that she was having an MS flare. She is taking Gilenya without side effects. She reports that she was started on Marinol for nausea which has helped her a lot, she feels this has helped a lot of her MS symptoms as well, she is not having the dizziness as much and the burning pain in her hands that she calls "flesh pain like I'm on fire" is not occurring anymore. She is happier with her current Pain Management provider and reports the injections have helped her back pain.She is also on Lyrica 100mg  TID. Low back still keeps giving out, she has a hard time getting out of bed and is hoping to do  radioablation if approved. She is on Topamax 150mg  BID and Depakote 500mg  qhs, as well as Botox injections for migraines, but still gets migraines pretty often and has called our office several times for a migraine cocktail. Courses of steroid and Imitrex have not helped. She reports that when she was seeing Dr. Merlene Laughter and prescribed Belbuca, she was having a lot of drowsiness and was given Modafinil. She states that when she took it, she started having palpitations and tingling in the back of her head like she would have a seizure. She reports stress seizures are still occurring but not as often, she takes the diazepam twice a day now.   Her last MRI brain with and without contrast done 05/2017 did not show any acute changes, there were greater than 10 supratentorial subcentimeter white matter lesions, including new lesions. Some lesions have appearance of demyelination though there may be component of chronic small vessel ischemic changes. No abnormal enhancement.  HPI 03/28/15: This is a pleasant 38 yo RH woman with a history of relapsing-remitting multiple sclerosis, headaches, anxiety with panic attacks, conversion disorder with seizures. She presented at [redacted] weeks pregnant to determine if diazepam for anxiety/seizures would be a medication she can use, understanding this is pregnancy category D versus risks of injuring herself from falls when she has the psychogenic seizures. Records from her previous neurologists at [place] were reviewed. She was diagnosed with MS in June 2008 while living in [place] state, she started having right thigh pain and right eye blurred vision. She was diagnosed with right optic neuritis  and underwent MRI brain and lumbar puncture which confirmed a diagnosis of MS. 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 She has been on  Gilenya since 2014 without any relapse. This was discontinued in December 2016 when she found out she was pregnant. She has a history of chronic pain and migraines, and was taking Depakote and Topamax for headache prophylaxis, also stopped in December 2016. She reports having Botox for her migraines, but so far migraines are not as bad as they were.  She started having shaking episodes in April 2016. Initially Depakote dose was increased to 500mg  BID. She was back in May 2016 with multiple episodes of syncope, palpitations, a sensation of her heart jumping out of her chest. She also reported lightheadedness, facial numbness, and numbness in her hands and feet. On her last visit in June 2016 at [facility], shaking episodes were discussed. Events always occur after a stressful event. She had a spell in [place] where she had a normal EEG and was diagnosed with a conversion disorder. She had seen Dr. Darleene Cleaver and reportedly told her he could not help her because "her case was too complex." She started seeing a psychiatrist in [place], however since she switched neurologists, she reports that he cannot see her anymore. She has not been able to establish care with [facility] and continues to have anxiety and panic attacks "all the time." She is diaphoretic in the office today and reports she always feels anxious. When she starts having one of her seizures, she would have significant anxiety, "my body can't deal and goes into a seizure," she hears her heartbeat in her ears. If it slowly builds up, she can get herself to a safe place, however other times it "just hits me," and she falls to the ground and has injured herself. She reports falling out one time in the bathroom during a doctor visit. She is currently on Fluoxetine and Buspirone. Clonazepam in the past did not help. She would usually go to the ER for these spells, which would quiet down once she gets Benadryl and Diazepam.  She delivered early at [redacted] weeks  gestation last July 12, 2015. A week after delivery, she started having numbness on the top of both feet, pain in the balls of her feet, with numbness traveling up to her mid-back. She also has numbness in both forearms, and reports her palms feel the same as her soles, with constant stabbing burning pain. She had these symptoms pre-pregnancy but states the numbness only went up to her knees. During pregnancy, all her symptoms were less bothersome. She has been following with Pain management in [facility] and would occasionally take Oxycodone prior to her pregnancy.  Diagnostic Data:  I personally reviewed MRI brain with and without contrast done with [facility] last 10/02/13 which did not show any acute changes, there was multiple T2/FLAIR lesions in the subcortical and deep white matter regions, no abnormal enhancement. Hippocampi appear asymmetric, smaller on the right without abnormal signal or enhancement.  MRI C-spine with and without contrast 10/18/13 showed hazy T2 hyperintensity within the cord from C2-3 to C4-5 without abnormal enhancement.  per [facility] - MRI brain done 03/18/14: "No change in numerous multifocal T2 hyperintense white matter lesions relative to prior MRI from 04/17/2013 most consistent with demyelinating lesions given history of multiple sclerosis. No evidence of active demyelination."  MRI cervical  spine done 03/18/14: "Relative to prior MRI from 10/20/2009, no change in patchy foci of increased T2 signal in the dorsal cord at C2- C4 in keeping with demyelinating lesions in this patient with a clinical history of multiple sclerosis.  No evidence of enhancement to suggest active demyelination. Moderately advanced degenerative disease at C5-C6 and C6-C7 with interval development of left eccentric central, foraminal entry zone and foraminal disc extrusion at C6-C7 exerting mass effect on the exiting C7 and descending C8 roots.  MRI brain and C-spine 07/30/2015 with and without contrast :  There was no abnormal enhancement. There were no changes from previous MRI brain in 09/2013 with multiple bilateral subcortical greater than 10 periventricular T2 hyperintensities. There were no cord lesions seen up to T3-4. There was chronic degenerative disc disease and central canal stenosis at C5-6 and C6-7, no abnormal cord signal, unchanged from 06/2014.  EMG/NCV done on both UE and right LE showed bilateral carpal tunnel syndrome, mild to moderate in degree, no evidence of polyneuropathy or radiculopathy on the right side.    Observations/Objective:   Vitals:   05/05/18 1025  Weight: 260 lb (117.9 kg)  Height: 5\' 4"  (1.626 m)   Patient is awake, alert, oriented x 3. No aphasia or dysarthria. Intact fluency and comprehension. Remote and recent memory intact. Able to name and repeat. Cranial nerves: Extraocular movements intact with no nystagmus. No facial asymmetry. Motor: moves all extremities symmetrically. No incoordination on finger to nose testing. Gait: slow and cautious due to back pain, no ataxia  Assessment and Plan:   This is a 38 yo RH woman with a history of relapsing-remitting multiple sclerosis, migraine with aura, anxiety with panic attacks, conversion disorder with psychogenic seizures. Her MS has been overall stable, she has several complaints such as dizziness, paresthesias, and continued migraines. The dizziness and paresthesias improved with Marinol started for her nausea. She is due for interval MRI brain with and without contrast for MS follow-up. Refills for Gilenya sent. She continues to have migraines on Botox, Depakote 500mg  qhs and Topamax 150mg  BID. She is also on Lyrica 100mg  TID for pain. She is interested in other options such as CGRP inhibitors, we discussed that Botox will have to be discontinued when she gets on a CGRP inhibitor. A prescription for Aimovig will be sent today. Refills for Depakote and Topamax sent. She reports occasional psychogenic seizures and  continues close follow-up with Psychiatry. Continue follow-up with Pain Management. Follow-up in 6 months, she knows to call for any changes.   Follow Up Instructions:   -I discussed the assessment and treatment plan with the patient. The patient was provided an opportunity to ask questions and all were answered. The patient agreed with the plan and demonstrated an understanding of the instructions.   The patient was advised to call back or seek an in-person evaluation if the symptoms worsen or if the condition fails to improve as anticipated.     Cameron Sprang, MD

## 2018-05-07 ENCOUNTER — Telehealth: Payer: Self-pay

## 2018-05-07 NOTE — Telephone Encounter (Signed)
PA approved   Aimovig 140mg /ml autoinjector  PA good until 08/04/2018

## 2018-05-11 ENCOUNTER — Ambulatory Visit: Payer: Self-pay | Admitting: Gastroenterology

## 2018-05-14 ENCOUNTER — Telehealth: Payer: Self-pay | Admitting: Neurology

## 2018-05-14 NOTE — Telephone Encounter (Signed)
Pt called about her MRI. Pt informed it was ordered and that they would call her once it was scheduled due to the covid 19

## 2018-05-14 NOTE — Telephone Encounter (Signed)
Patient needs to talk about getting her MRI sch and where it needs to be done at and why  Please call

## 2018-05-22 ENCOUNTER — Telehealth: Payer: Self-pay | Admitting: Neurology

## 2018-05-22 DIAGNOSIS — M792 Neuralgia and neuritis, unspecified: Secondary | ICD-10-CM

## 2018-05-22 MED ORDER — PREGABALIN 100 MG PO CAPS: 100.0000 mg | ORAL_CAPSULE | Freq: Three times a day (TID) | ORAL | 0 refills | Status: DC

## 2018-05-22 NOTE — Telephone Encounter (Signed)
Pls let her know [facility] clinics are pretty strict with medications they prescribe, I will give her a week of it, but she has to contact them for further refills after 1 week. Thanks

## 2018-05-22 NOTE — Telephone Encounter (Signed)
She did call back to let the office know that Dr. Electa Sniff was the Physician who prescribed the Lyrica. She said his office is closed. Thanks

## 2018-05-22 NOTE — Telephone Encounter (Signed)
Called pt because it looks like the pain clinic filled her lyrica, pt stated that she called pain clinic and that they are closed and that she is out of lyrica, she said her mom is in the hospital and my have her foot cut off her dad is her only ride and she isnt sure other than today when she might be able to go to the drug store

## 2018-05-22 NOTE — Telephone Encounter (Signed)
Patient is calling in about the Lyrica medication. She wasn't sure if Dr. Delice Lesch refills this but she is due for the refill today. Please send in a refill if so. If not, Dr, Tomi Likens would be the one. she accidentally threw away the medication bottle.This needs to go to [facility] on file. Thanks!

## 2018-05-22 NOTE — Telephone Encounter (Signed)
Pt called informed that Dr. Delice Lesch would call in 1 week worth of Lyrica, she needed to call pain clinic to have them refill her lyrica when they open

## 2018-05-25 ENCOUNTER — Telehealth: Payer: Self-pay | Admitting: Internal Medicine

## 2018-05-25 NOTE — Telephone Encounter (Signed)
Lmom, I'm not sure who called pt.

## 2018-05-25 NOTE — Telephone Encounter (Signed)
[place], SAID SOMEONE CALLED HER

## 2018-05-28 ENCOUNTER — Ambulatory Visit (INDEPENDENT_AMBULATORY_CARE_PROVIDER_SITE_OTHER): Payer: Medicare Other | Admitting: Psychiatry

## 2018-05-28 ENCOUNTER — Telehealth: Payer: Self-pay | Admitting: *Deleted

## 2018-05-28 ENCOUNTER — Other Ambulatory Visit: Payer: Self-pay

## 2018-05-28 ENCOUNTER — Encounter (HOSPITAL_COMMUNITY): Payer: Self-pay | Admitting: Psychiatry

## 2018-05-28 DIAGNOSIS — F4310 Post-traumatic stress disorder, unspecified: Secondary | ICD-10-CM | POA: Diagnosis not present

## 2018-05-28 DIAGNOSIS — F445 Conversion disorder with seizures or convulsions: Secondary | ICD-10-CM | POA: Diagnosis not present

## 2018-05-28 DIAGNOSIS — F332 Major depressive disorder, recurrent severe without psychotic features: Secondary | ICD-10-CM | POA: Diagnosis not present

## 2018-05-28 DIAGNOSIS — F411 Generalized anxiety disorder: Secondary | ICD-10-CM

## 2018-05-28 DIAGNOSIS — I639 Cerebral infarction, unspecified: Secondary | ICD-10-CM | POA: Diagnosis not present

## 2018-05-28 DIAGNOSIS — G43611 Persistent migraine aura with cerebral infarction, intractable, with status migrainosus: Secondary | ICD-10-CM | POA: Diagnosis not present

## 2018-05-28 DIAGNOSIS — F4001 Agoraphobia with panic disorder: Secondary | ICD-10-CM | POA: Diagnosis not present

## 2018-05-28 MED ORDER — BUSPIRONE HCL 15 MG PO TABS: 15.0000 mg | ORAL_TABLET | Freq: Three times a day (TID) | ORAL | 2 refills | Status: DC

## 2018-05-28 MED ORDER — LITHIUM CARBONATE ER 300 MG PO TBCR: 900.0000 mg | EXTENDED_RELEASE_TABLET | Freq: Every day | ORAL | 2 refills | Status: DC

## 2018-05-28 MED ORDER — FLUOXETINE HCL 40 MG PO CAPS: 80.0000 mg | ORAL_CAPSULE | Freq: Every day | ORAL | 2 refills | Status: DC

## 2018-05-28 MED ORDER — DIAZEPAM 5 MG PO TABS: 5.0000 mg | ORAL_TABLET | Freq: Three times a day (TID) | ORAL | 2 refills | Status: DC | PRN

## 2018-05-28 NOTE — Progress Notes (Signed)
Virtual Visit via Telephone Note  I connected with Emily [name] on 05/28/18 at 11:30 AM EDT by telephone and verified that I am speaking with the correct person using two identifiers.   I discussed the limitations, risks, security and privacy concerns of performing an evaluation and management service by telephone and the availability of in person appointments. I also discussed with the patient that there may be a patient responsible charge related to this service. The patient expressed understanding and agreed to proceed.   History of Present Illness: Overall pt states she is doing "alright". Her anxiety and panic are worse. Her hands will not stop shacking. She can not calm down. She takes one Valium during the day and it helps some but not enough. She takes the second dose of Valium later in the day when she gets irritated. [name] is able to  "fake it" so others do not know she is suffering.  She is immunocompromised and is fearful of being infected. She only goes out the grocery store. Other than that she is at home. Prior to [place] restrictions she was able to go out and see friends. It was a stress reliever. Now she has no escape. [name] is feeling depressed. She is feeling "caged in" and is crying a lot. Pt is overwhelmed taking care of her daughter and her mom who is sick. Her mother is very critical of [name]. Pt has no time to herself. Pt is tired all the time. She is having nightmares on/off and not sleeping well. Pt denies SI/HI. Meds are helping. Her parents have told her that her mood is better and she has been trying to be more positive.  Therapy has really helped.     Observations/Objective: I spoke with the patient on the phone.  She was calm, pleasant and cooperative.  She was engaged in the conversation and answered questions appropriately.  Her speech was clear and coherent with norma tone, rate and volume.  Mood is anxious and depressed and affect is congruent.  Thought  content is coherent and circumstantial.  Thought processes are logical.  She denies SI/HI.  She denies AVH and did not appear to be responding to internal stimuli.  Attention and concentration were good.  Use of language and fund of knowledge are average.  Insight and judgment are fair.  I am unable to comment on her physical appearance, eye contact or psychomotor activity as I am unable to physically see the patient.   I reviewed the information below on May 28, 2018 and have updated it Assessment and Plan: GAD; Conversion disorder with pseudoseizures; MDD-recurrent, moderate; PTSD; Insomnia  Patient is reporting an increase in anxiety symptoms.  She is overwhelmed with responsibilities of caring for her daughter and her mother.  Patient is also extremely worried about her own health.  She is been unable to engage in self-care and realizes that it is playing a big part in her mood and anxiety problems.  lithium CR  900 mg p.o. daily for MDD Continue Prozac 80 mg p.o. daily for MDD, GAD and PTSD Continue BuSpar 15 mg p.o. 3 times daily for GAD and mood augmentation increase Valium 5 mg p.o. TID prn  anxiety and pseudoseizures.  Patient has been taking this medication for several months and denies any side effects or adverse reactions.  She is prescribed Topamax and Depakote by her neurologist.  patient continues therapy with her sister. It has been very helpful and pt is trying to use the  coping skills she is learning. . .  Follow Up Instructions: In 2 months or sooner if needed   I discussed the assessment and treatment plan with the patient. The patient was provided an opportunity to ask questions and all were answered. The patient agreed with the plan and demonstrated an understanding of the instructions.   The patient was advised to call back or seek an in-person evaluation if the symptoms worsen or if the condition fails to improve as anticipated.  I provided 30 minutes of  non-face-to-face time during this encounter.   Charlcie Cradle, MD

## 2018-05-28 NOTE — Telephone Encounter (Signed)
Called patient. She states her back keeps going in and out and was needed a "Shot". Discussed the Covid status and to continue taking her meds as ordered. If pain got too bad instructed her that she could always go to the ER

## 2018-06-03 ENCOUNTER — Telehealth (HOSPITAL_COMMUNITY): Payer: Self-pay

## 2018-06-03 NOTE — Telephone Encounter (Signed)
Error
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
## 2018-06-04 ENCOUNTER — Other Ambulatory Visit (HOSPITAL_COMMUNITY): Payer: Self-pay | Admitting: Psychiatry

## 2018-06-04 DIAGNOSIS — F4001 Agoraphobia with panic disorder: Secondary | ICD-10-CM

## 2018-06-04 DIAGNOSIS — F4310 Post-traumatic stress disorder, unspecified: Secondary | ICD-10-CM

## 2018-06-04 DIAGNOSIS — F411 Generalized anxiety disorder: Secondary | ICD-10-CM

## 2018-06-04 NOTE — Telephone Encounter (Signed)
The dose can not be increased. I recommend she speak with her therapist for more help with self care.

## 2018-06-05 ENCOUNTER — Ambulatory Visit: Payer: Self-pay | Admitting: Surgery

## 2018-06-05 NOTE — H&P (Addendum)
CC: Here for f/u regarding hemorrhoids  HPI: 54F with hx of multiple sclerosis, migraines, chronic pain related to MS (takes daily [place] - oral dissolving buprenorphine) presented to our office for evaluation of possible hemorrhoids in 2018. She described a 35yr hx of anal tissue prolapse which was bothersome. She noted anal canal tissue prolapse with BMs that occasionally requires manual reduction but often reduces on it's own. OCcasional bright red blood per rectum. Was seen 10/21/16 by Dr. Sallyanne Havers for a thrombosed external hemorrhoid and extreme pain - he lanced. She has had siginificant improvement in her pain.  When seen last, she denied daily fiber supplementation nor laxatives. Had BM every 2-3 days, intermittent constipation. Spended 10-15 minutes on the commode with each BM. Last colonoscopy 2008 at time of stoma reversal; done by Dr. Tacy Dura - she reports it was normal  INTERVAL HX She underwent colonoscopy by myself 01/29/17 which demonstrated nonthrombosed external and internal hemorrhoids. Multiple smallmouth diverticula in the sigmoid versus descending colon, patent functional end-to-end colocolonic anastomosis. No polyps were identified. She is due for repeat in 5 years. She is here today for follow-up regarding her hemorrhoids. She has had 2-3 subsequent episodes of what she describes as thromboses that were lanced. She is now taking a fiber supplement every day as well as daily MiraLAX. She reports having 1 soft/formed bowel movement per day. She typically spends to 3 minutes on, but occasionally spends more time if she is having pain that she attributes to a passing kidney stone. Despite all of her dietary and lifestyle modifications she still continues to have symptoms. She returns today requesting surgery for her hemorrhoids. She denies any bleeding per rectum. She reports discomfort with BMs and tissue prolapse, intermittent episodes of thrombosis.   She brings with her to  this visit pictures on her phone that she states her pictures of her anus when her hemorrhoids are "flared." This shows what appears to be 3 column external hemorrhoids that are edematous.  PMH: Multiple sclerosis (currently controlled - following with neurology) , diverticulitis, chronic pain related to MS (Following with pain management, taking PO buprenoprphine), migraines  PSH: C-sx x1; exploratory laparoscopy and what sounds to be sigmoid colectomy with end colostomy in [place], 2008; subsequent Hartman's reversal; multiple laparotomies and 2 prior ventral hernia repairs including mesh placement  XTK:WIOXBD family hx of malignancy  Social: Denies use of tobacco/EtOH/drug use.  The patient is a 38 year old female.   Allergies Emeline Gins, [place]; 02/09/2018 3:36 PM) Amitriptyline HCl *ANTIDEPRESSANTS*  hypertension Baclofen *DERMATOLOGICALS*  Hives, Shortness of breath. Cymbalta *ANTIDEPRESSANTS*  Shortness of breath, Rash. Gabapentin *ANTICONVULSANTS*  Shortness of breath, Rash. MONOSODIUM GLUTAMATE  Anaphylaxis. Vicodin *ANALGESICS - OPIOID*  Hives, Nausea, Vomiting. ALPRAZolam *ANTIANXIETY AGENTS*  Magnesium Salicylate *CHEMICALS*  Hives, Itching. Rizatriptan Benzoate *MIGRAINE PRODUCTS*  Nausea, Vomiting. Tizanidine Comfort Pac *MUSCULOSKELETAL THERAPY AGENTS*  Hives. TraMADol HCl *ANALGESICS - OPIOID*  Adhesive 1"x6yd *MEDICAL DEVICES AND SUPPLIES*  LamoTRIgine *ANTICONVULSANTS*  Rash. Toradol *ANALGESICS - ANTI-INFLAMMATORY*  Nausea. Allergies Reconciled   Medication History Emeline Gins, CMA; 02/09/2018 3:37 PM) Vitamin D (Ergocalciferol) (50000UNIT Capsule, Oral) Active. [employer] D3 (5000UNIT Capsule, Oral) Active. DiazePAM (5MG  Tablet, Oral) Active. Amoxicillin (500MG  Capsule, Oral) Active. Hydrocodone-Acetaminophen (5-325MG  Tablet, Oral) Active. Divalproex Sodium (500MG  Tablet DR, Oral) Active. Gilenya (0.5MG  Capsule, Oral) Active. Methocarbamol  (750MG  Tablet, Oral) Active. Ibuprofen (800MG  Tablet, Oral) Active. Divalproex Sodium (Migraine) (500MG  Tablet ER 24HR, Oral at bedtime) Active. Topiramate (50MG  Tablet, Oral two times daily) Active. SUMAtriptan Succinate (100MG  Tablet,  Oral as needed) Active. AmLODIPine Besylate (5MG  Tablet, Oral every morning) Active. Methocarbamol (500MG  Tablet, Oral two times daily as needed) Active. BusPIRone HCl (15MG  Tablet, Oral three times daily) Active. piazepam (5mg  daily) Active. FLUoxetine HCl (60MG  Tablet, Oral daily) Active. HydroCHLOROthiazide (25MG  Tablet, Oral daily) Active. Metoclopramide HCl (10MG  Tablet, Oral as needed) Active. Albuterol Sulfate HFA (108 (90 Base)MCG/ACT Aerosol Soln, Inhalation) Active. epi pen  (0.3mg ) Active. RaNITidine HCl (300MG  Tablet, Oral at bedtime) Active. Modafinil (200MG  Tablet, Oral) Active. Medications Reconciled    Review of Systems Harrell Gave M. Joella Saefong MD; 02/09/2018 4:05 PM) General Not Present- Appetite Loss, Chills, Fatigue, Fever, Weight Gain and Weight Loss. Skin Not Present- Change in Wart/Mole, Dryness, Hives, Jaundice, New Lesions, Non-Healing Wounds, Rash and Ulcer. HEENT Present- Seasonal Allergies and Wears glasses/contact lenses. Not Present- Earache, Hearing Loss, Hoarseness, Nose Bleed, Oral Ulcers, Ringing in the Ears, Sinus Pain, Sore Throat, Visual Disturbances and Yellow Eyes. Respiratory Not Present- Bloody sputum, Chronic Cough, Difficulty Breathing, Snoring and Wheezing. Breast Not Present- Breast Mass, Breast Pain, Nipple Discharge and Skin Changes. Cardiovascular Not Present- Chest Pain, Difficulty Breathing Lying Down, Leg Cramps, Palpitations, Rapid Heart Rate, Shortness of Breath and Swelling of Extremities. Gastrointestinal Present- Hemorrhoids and Rectal Pain. Not Present- Abdominal Pain, Bloating, Bloody Stool, Change in Bowel Habits, Chronic diarrhea, Constipation, Difficulty Swallowing, Excessive gas, Gets  full quickly at meals, Indigestion, Nausea and Vomiting. Female Genitourinary Present- Frequency and Urgency. Not Present- Nocturia, Painful Urination and Pelvic Pain. Musculoskeletal Present- Back Pain. Not Present- Joint Pain, Joint Stiffness, Muscle Pain, Muscle Weakness and Swelling of Extremities. Neurological Present- Fainting, Headaches, Numbness, Seizures, Tingling, Trouble walking and Weakness. Not Present- Decreased Memory and Tremor. Endocrine Not Present- Cold Intolerance, Excessive Hunger, Hair Changes, Heat Intolerance, Hot flashes and New Diabetes. Hematology Not Present- Blood Thinners, Easy Bruising, Excessive bleeding, Gland problems, HIV and Persistent Infections.  Vitals Emeline Gins CMA; 02/09/2018 3:36 PM) 02/09/2018 3:35 PM Weight: 255 lb Height: 64in Body Surface Area: 2.17 m Body Mass Index: 43.77 kg/m  Temp.: 97.41F  Pulse: 89 (Regular)  BP: 126/82 (Sitting, Left Arm, Standard)       Physical Exam Harrell Gave M. Deovion Batrez MD; 02/09/2018 4:09 PM) The physical exam findings are as follows: Note:Constitutional: No acute distress; conversant; no deformities Eyes: Moist conjunctiva; no lid lag; anicteric sclerae; pupils equal round and reactive to light Neck: Trachea midline; no palpable thyromegaly Lungs: Normal respiratory effort; no tactile fremitus CV: Regular rate and rhythm; no palpable thrill; no pitting edema GI: Abdomen obese, soft, nontender, nondistended; no palpable hepatosplenomegaly Anorectal: Currently decompressed 3 column external hemorrhoids as well as associated internal hemorrhoids. Normal-appearing perianal skin. DRE-good tone and squeeze. Anoscopy deferred at her request. MSK: Normal gait; no clubbing/cyanosis Psychiatric: Appropriate affect; alert and oriented 3 Lymphatic: No palpable cervical or axillary lymphadenopathy **A chaperone, Lennart Pall, was present for this encounter    Assessment & Plan Harrell Gave M. Huber Mathers  MD; 02/09/2018 4:27 PM) HEMORRHOID (K64.9) Story: Ms. Calico is a very pleasant 36yoF with hx of MS, migraines, chronic pain (taking PO buprenorphine) presents for follow-up evaluation of her hemorrhoidal disease Impression: -She has now tried medical management with daily fiber supplementation and MiraLAX as well as increasing her water intake. She has been minimizing time on commode. She's now having soft/formed bowel movements daily. Despite these maneuvers she still having episodes of recurrent thromboses and problems related to her hemorrhoids and would like them addressed surgically -The anatomy and physiology of the anal canal was discussed at length with  the patient again today using our hemorrhoid handout. The pathophysiology of hemorrhoids was was reviewed at length with associated pictures and illustrations as well -We discussed hemorrhoidectomy, anorectal exam under anesthesia -The procedure including technique, material risks (including, but not limited to, pain, bleeding, infection, pelvic sepsis which is rare and in certain cases can require diverting stoma, scarring, anal stenosis, need for blood transfusion, need for additional procedures, damage to anal sphincter, incontinence of gas and/or stool, need for additional procedures, recurrence, pneumonia, heart attack, stroke, death) benefits and alternatives to surgery were discussed at length. I noted a good probability that the procedure would help improve their symptoms. We discussed possibility of not removing all hemorrhoidal tissue at this operation by going after the largest hemorrhoidal tissue if removal of all tissue would be felt to place her at high risk for anal stenosis. The patient's questions were answered to her satisfaction, she voiced understanding and elected to proceed with surgery. Additionally, we discussed typical postoperative expectations and the recovery process. -Given her history of narcotic use-oral buprenorphine being  managed by Dr. Merlene Laughter - both she and myself are planning to reach out to discuss the upcoming procedure and plan of care for postoperative pain control.  Signed electronically by Ileana Roup, MD (02/09/2018 4:27 PM)

## 2018-06-05 NOTE — Telephone Encounter (Signed)
Patient called back and I advised her to continue the medication as prescribed and gave her some information on where to go for therapy. Patient understood and will call back if needed.

## 2018-06-06 ENCOUNTER — Other Ambulatory Visit: Payer: Self-pay

## 2018-06-06 ENCOUNTER — Encounter (HOSPITAL_COMMUNITY): Payer: Self-pay

## 2018-06-06 ENCOUNTER — Encounter (HOSPITAL_BASED_OUTPATIENT_CLINIC_OR_DEPARTMENT_OTHER): Payer: Self-pay

## 2018-06-06 NOTE — Progress Notes (Signed)
SPOKE W/  Mashal     SCREENING SYMPTOMS OF COVID 19:   COUGH--NO  RUNNY NOSE--- NO  SORE THROAT---NO  NASAL CONGESTION----NO  SNEEZING----NO  SHORTNESS OF BREATH---NO  DIFFICULTY BREATHING---NO  TEMP >100.0 -----NO  UNEXPLAINED BODY ACHES------NO  CHILLS -------- NO  HEADACHES ---------NO  LOSS OF SMELL/ TASTE --------NO    HAVE YOU OR ANY FAMILY MEMBER TRAVELLED PAST 14 DAYS OUT OF THE   COUNTY---NO STATE----NO COUNTRY----NO  HAVE YOU OR ANY FAMILY MEMBER BEEN EXPOSED TO ANYONE WITH COVID 19? NO

## 2018-06-06 NOTE — Progress Notes (Signed)
Emily [name] is a difficult I.V. stick Spoke with: Emily [name] NPO:  After Midnight, no gum, candy, or mints   Arrival time: 0830AM Labs:  (CBC/diff, CMP, COVID, PT, PTT, EKG 06/08/2018 in epic) AM medications:  Amlodipine, Buspirone, Valium, Gilenya, Fluoxetine, Methocarbamol, Omeprazole, Pregablin, Topiramate, (Bring Asthma Inhaler, Will use fleet enema at bedtime and morning of surgery, will shower with hibiclens night before and day of surgery) Pre op orders: Yes Ride home:  Emily [name] (dad) [phone]

## 2018-06-08 ENCOUNTER — Other Ambulatory Visit (HOSPITAL_COMMUNITY): Payer: Medicare Other

## 2018-06-08 ENCOUNTER — Other Ambulatory Visit: Payer: Self-pay

## 2018-06-08 ENCOUNTER — Other Ambulatory Visit (HOSPITAL_COMMUNITY)
Admission: RE | Admit: 2018-06-08 | Discharge: 2018-06-08 | Disposition: A | Discharge status: Home / Self Care | Payer: Medicare Other | Source: Ambulatory Visit | Attending: Surgery | Admitting: Surgery

## 2018-06-08 ENCOUNTER — Encounter (HOSPITAL_COMMUNITY)
Admission: RE | Admit: 2018-06-08 | Discharge: 2018-06-08 | Disposition: A | Discharge status: Home / Self Care | Payer: Medicare Other | Source: Ambulatory Visit | Attending: Surgery | Admitting: Surgery

## 2018-06-08 DIAGNOSIS — Z1159 Encounter for screening for other viral diseases: Secondary | ICD-10-CM | POA: Insufficient documentation

## 2018-06-08 HISTORY — DX: Presence of spectacles and contact lenses: Z97.3

## 2018-06-08 LAB — COMPREHENSIVE METABOLIC PANEL
ALT: 15 U/L (ref 0–44)
AST: 18 U/L (ref 15–41)
Albumin: 4 g/dL (ref 3.5–5.0)
Alkaline Phosphatase: 81 U/L (ref 38–126)
Anion gap: 9 (ref 5–15)
BUN: 13 mg/dL (ref 6–20)
CO2: 24 mmol/L (ref 22–32)
Calcium: 8.9 mg/dL (ref 8.9–10.3)
Chloride: 106 mmol/L (ref 98–111)
Creatinine, Ser: 0.63 mg/dL (ref 0.44–1.00)
GFR calc Af Amer: >60 mL/min (ref >60)
GFR calc non Af Amer: >60 mL/min (ref >60)
Glucose, Bld: 85 mg/dL (ref 70–99)
Potassium: 3.8 mmol/L (ref 3.5–5.1)
Sodium: 139 mmol/L (ref 135–145)
Total Bilirubin: 0.3 mg/dL (ref 0.3–1.2)
Total Protein: 7.5 g/dL (ref 6.5–8.1)

## 2018-06-08 LAB — CBC WITH DIFFERENTIAL/PLATELET
Abs Immature Granulocytes: 0.02 10*3/uL (ref 0.00–0.07)
Basophils Absolute: 0 10*3/uL (ref 0.0–0.1)
Basophils Relative: 0 %
Eosinophils Absolute: 0 10*3/uL (ref 0.0–0.5)
Eosinophils Relative: 0 %
HCT: 42.1 % (ref 36.0–46.0)
Hemoglobin: 13.7 g/dL (ref 12.0–15.0)
Immature Granulocytes: 0 %
Lymphocytes Relative: 15 %
Lymphs Abs: 1.1 10*3/uL (ref 0.7–4.0)
MCH: 30.5 pg (ref 26.0–34.0)
MCHC: 32.5 g/dL (ref 30.0–36.0)
MCV: 93.8 fL (ref 80.0–100.0)
Monocytes Absolute: 0.5 10*3/uL (ref 0.1–1.0)
Monocytes Relative: 8 %
Neutro Abs: 5.5 10*3/uL (ref 1.7–7.7)
Neutrophils Relative %: 77 %
Platelets: 383 10*3/uL (ref 150–400)
RBC: 4.49 MIL/uL (ref 3.87–5.11)
RDW: 13.2 % (ref 11.5–15.5)
WBC: 7.1 10*3/uL (ref 4.0–10.5)
nRBC: 0 % (ref 0.0–0.2)

## 2018-06-08 LAB — PROTIME-INR
INR: 1 (ref 0.8–1.2)
Prothrombin Time: 12.8 seconds (ref 11.4–15.2)

## 2018-06-08 LAB — APTT: aPTT: 33 seconds (ref 24–36)

## 2018-06-10 LAB — NOVEL CORONAVIRUS, NAA (HOSP ORDER, SEND-OUT TO REF LAB; TAT 18-24 HRS): SARS-CoV-2, NAA: NOT DETECTED

## 2018-06-10 NOTE — Progress Notes (Signed)
SPOKE W/  _  Anshika   SCREENING SYMPTOMS OF COVID 19:   COUGH--NO  RUNNY NOSE--- NO  SORE THROAT---NO  NASAL CONGESTION----NO  SNEEZING----NO  SHORTNESS OF BREATH---NO  DIFFICULTY BREATHING--- NO  TEMP >100.0 -----NO  UNEXPLAINED BODY ACHES------NO  CHILLS -------- NO  HEADACHES ---------NO  LOSS OF SMELL/ TASTE --------NO    HAVE YOU OR ANY FAMILY MEMBER TRAVELLED PAST 14 DAYS OUT OF THE   COUNTY---NO STATE----NO COUNTRY----NO  HAVE YOU OR ANY FAMILY MEMBER BEEN EXPOSED TO ANYONE WITH COVID 19? NO

## 2018-06-11 ENCOUNTER — Ambulatory Visit (HOSPITAL_BASED_OUTPATIENT_CLINIC_OR_DEPARTMENT_OTHER)
Admission: RE | Admit: 2018-06-11 | Discharge: 2018-06-11 | Disposition: A | Discharge status: Home / Self Care | Payer: Medicare Other | Attending: Surgery | Admitting: Surgery

## 2018-06-11 ENCOUNTER — Encounter (HOSPITAL_BASED_OUTPATIENT_CLINIC_OR_DEPARTMENT_OTHER): Payer: Self-pay | Admitting: Certified Registered Nurse Anesthetist

## 2018-06-11 ENCOUNTER — Encounter (HOSPITAL_BASED_OUTPATIENT_CLINIC_OR_DEPARTMENT_OTHER)
Admission: RE | Disposition: A | Discharge status: Home / Self Care | Payer: Self-pay | Source: Home / Self Care | Attending: Surgery

## 2018-06-11 ENCOUNTER — Ambulatory Visit (HOSPITAL_BASED_OUTPATIENT_CLINIC_OR_DEPARTMENT_OTHER): Payer: Medicare Other | Admitting: Certified Registered"

## 2018-06-11 ENCOUNTER — Ambulatory Visit (HOSPITAL_BASED_OUTPATIENT_CLINIC_OR_DEPARTMENT_OTHER): Payer: Medicare Other | Admitting: Physician Assistant

## 2018-06-11 DIAGNOSIS — M1990 Unspecified osteoarthritis, unspecified site: Secondary | ICD-10-CM | POA: Insufficient documentation

## 2018-06-11 DIAGNOSIS — Z888 Allergy status to other drugs, medicaments and biological substances status: Secondary | ICD-10-CM | POA: Insufficient documentation

## 2018-06-11 DIAGNOSIS — I10 Essential (primary) hypertension: Secondary | ICD-10-CM | POA: Diagnosis not present

## 2018-06-11 DIAGNOSIS — Z8261 Family history of arthritis: Secondary | ICD-10-CM | POA: Insufficient documentation

## 2018-06-11 DIAGNOSIS — Z87891 Personal history of nicotine dependence: Secondary | ICD-10-CM | POA: Diagnosis not present

## 2018-06-11 DIAGNOSIS — Z933 Colostomy status: Secondary | ICD-10-CM | POA: Insufficient documentation

## 2018-06-11 DIAGNOSIS — K644 Residual hemorrhoidal skin tags: Secondary | ICD-10-CM | POA: Insufficient documentation

## 2018-06-11 DIAGNOSIS — Z9049 Acquired absence of other specified parts of digestive tract: Secondary | ICD-10-CM | POA: Diagnosis not present

## 2018-06-11 DIAGNOSIS — K760 Fatty (change of) liver, not elsewhere classified: Secondary | ICD-10-CM | POA: Diagnosis not present

## 2018-06-11 DIAGNOSIS — G629 Polyneuropathy, unspecified: Secondary | ICD-10-CM | POA: Insufficient documentation

## 2018-06-11 DIAGNOSIS — K6282 Dysplasia of anus: Secondary | ICD-10-CM | POA: Diagnosis not present

## 2018-06-11 DIAGNOSIS — M5030 Other cervical disc degeneration, unspecified cervical region: Secondary | ICD-10-CM | POA: Insufficient documentation

## 2018-06-11 DIAGNOSIS — F329 Major depressive disorder, single episode, unspecified: Secondary | ICD-10-CM | POA: Insufficient documentation

## 2018-06-11 DIAGNOSIS — K219 Gastro-esophageal reflux disease without esophagitis: Secondary | ICD-10-CM | POA: Insufficient documentation

## 2018-06-11 DIAGNOSIS — G35 Multiple sclerosis: Secondary | ICD-10-CM | POA: Insufficient documentation

## 2018-06-11 DIAGNOSIS — Z8249 Family history of ischemic heart disease and other diseases of the circulatory system: Secondary | ICD-10-CM | POA: Insufficient documentation

## 2018-06-11 DIAGNOSIS — G8929 Other chronic pain: Secondary | ICD-10-CM | POA: Insufficient documentation

## 2018-06-11 DIAGNOSIS — K5730 Diverticulosis of large intestine without perforation or abscess without bleeding: Secondary | ICD-10-CM | POA: Insufficient documentation

## 2018-06-11 DIAGNOSIS — K648 Other hemorrhoids: Secondary | ICD-10-CM | POA: Diagnosis not present

## 2018-06-11 DIAGNOSIS — K625 Hemorrhage of anus and rectum: Secondary | ICD-10-CM | POA: Diagnosis not present

## 2018-06-11 DIAGNOSIS — G43909 Migraine, unspecified, not intractable, without status migrainosus: Secondary | ICD-10-CM | POA: Diagnosis not present

## 2018-06-11 DIAGNOSIS — G43109 Migraine with aura, not intractable, without status migrainosus: Secondary | ICD-10-CM | POA: Diagnosis not present

## 2018-06-11 DIAGNOSIS — Z87442 Personal history of urinary calculi: Secondary | ICD-10-CM | POA: Diagnosis not present

## 2018-06-11 DIAGNOSIS — Z885 Allergy status to narcotic agent status: Secondary | ICD-10-CM | POA: Insufficient documentation

## 2018-06-11 DIAGNOSIS — Z809 Family history of malignant neoplasm, unspecified: Secondary | ICD-10-CM | POA: Insufficient documentation

## 2018-06-11 DIAGNOSIS — K649 Unspecified hemorrhoids: Secondary | ICD-10-CM | POA: Diagnosis not present

## 2018-06-11 DIAGNOSIS — K5900 Constipation, unspecified: Secondary | ICD-10-CM | POA: Diagnosis not present

## 2018-06-11 DIAGNOSIS — K642 Third degree hemorrhoids: Secondary | ICD-10-CM | POA: Diagnosis not present

## 2018-06-11 DIAGNOSIS — F4310 Post-traumatic stress disorder, unspecified: Secondary | ICD-10-CM | POA: Diagnosis not present

## 2018-06-11 DIAGNOSIS — F419 Anxiety disorder, unspecified: Secondary | ICD-10-CM | POA: Insufficient documentation

## 2018-06-11 DIAGNOSIS — Z833 Family history of diabetes mellitus: Secondary | ICD-10-CM | POA: Insufficient documentation

## 2018-06-11 HISTORY — DX: Other seasonal allergic rhinitis: J30.2

## 2018-06-11 HISTORY — DX: Fatty (change of) liver, not elsewhere classified: K76.0

## 2018-06-11 HISTORY — DX: Unspecified osteoarthritis, unspecified site: M19.90

## 2018-06-11 HISTORY — PX: EVALUATION UNDER ANESTHESIA WITH HEMORRHOIDECTOMY: SHX5624

## 2018-06-11 HISTORY — DX: Personal history of urinary calculi: Z87.442

## 2018-06-11 HISTORY — DX: Polyneuropathy, unspecified: G62.9

## 2018-06-11 HISTORY — DX: Other cervical disc degeneration, unspecified cervical region: M50.30

## 2018-06-11 HISTORY — DX: Residual hemorrhoidal skin tags: K64.4

## 2018-06-11 HISTORY — DX: Dizziness and giddiness: R42

## 2018-06-11 LAB — POCT PREGNANCY, URINE: Preg Test, Ur: NEGATIVE

## 2018-06-11 SURGERY — EXAM UNDER ANESTHESIA, RECTUM
Anesthesia: General

## 2018-06-11 SURGERY — EXAM UNDER ANESTHESIA WITH HEMORRHOIDECTOMY
Anesthesia: General

## 2018-06-11 MED ORDER — BUPIVACAINE LIPOSOME 1.3 % IJ SUSP
20.0000 mL | Freq: Once | INTRAMUSCULAR | Status: DC
  Filled 2018-06-11: qty 20

## 2018-06-11 MED ORDER — BUPIVACAINE LIPOSOME 1.3 % IJ SUSP
INTRAMUSCULAR | Status: DC | PRN
  Administered 2018-06-11: 20 mL

## 2018-06-11 MED ORDER — SUGAMMADEX SODIUM 200 MG/2ML IV SOLN
INTRAVENOUS | Status: DC | PRN
  Administered 2018-06-11: 200 mg via INTRAVENOUS

## 2018-06-11 MED ORDER — HYDROMORPHONE HCL 1 MG/ML IJ SOLN
INTRAMUSCULAR | Status: AC
  Filled 2018-06-11: qty 1

## 2018-06-11 MED ORDER — DEXAMETHASONE SODIUM PHOSPHATE 10 MG/ML IJ SOLN
INTRAMUSCULAR | Status: AC
  Filled 2018-06-11: qty 1

## 2018-06-11 MED ORDER — MIDAZOLAM HCL 2 MG/2ML IJ SOLN
INTRAMUSCULAR | Status: DC | PRN
  Administered 2018-06-11: 2 mg via INTRAVENOUS

## 2018-06-11 MED ORDER — CHLORHEXIDINE GLUCONATE CLOTH 2 % EX PADS
6.0000 | MEDICATED_PAD | Freq: Once | CUTANEOUS | Status: DC
  Filled 2018-06-11: qty 6

## 2018-06-11 MED ORDER — ROCURONIUM BROMIDE 50 MG/5ML IV SOSY
PREFILLED_SYRINGE | INTRAVENOUS | Status: DC | PRN
  Administered 2018-06-11: 60 mg via INTRAVENOUS

## 2018-06-11 MED ORDER — ONDANSETRON HCL 4 MG/2ML IJ SOLN
INTRAMUSCULAR | Status: DC | PRN
  Administered 2018-06-11: 4 mg via INTRAVENOUS

## 2018-06-11 MED ORDER — DIBUCAINE (PERIANAL) 1 % EX OINT
TOPICAL_OINTMENT | CUTANEOUS | Status: DC | PRN
  Administered 2018-06-11: 1 via RECTAL

## 2018-06-11 MED ORDER — LIDOCAINE 2% (20 MG/ML) 5 ML SYRINGE
INTRAMUSCULAR | Status: AC
  Filled 2018-06-11: qty 5

## 2018-06-11 MED ORDER — ACETAMINOPHEN 500 MG PO TABS
1000.0000 mg | ORAL_TABLET | ORAL | Status: AC
  Administered 2018-06-11: 1000 mg via ORAL
  Filled 2018-06-11: qty 2

## 2018-06-11 MED ORDER — ROCURONIUM BROMIDE 10 MG/ML (PF) SYRINGE
PREFILLED_SYRINGE | INTRAVENOUS | Status: AC
  Filled 2018-06-11: qty 10

## 2018-06-11 MED ORDER — MIDAZOLAM HCL 2 MG/2ML IJ SOLN
INTRAMUSCULAR | Status: AC
  Filled 2018-06-11: qty 2

## 2018-06-11 MED ORDER — SUGAMMADEX SODIUM 200 MG/2ML IV SOLN
INTRAVENOUS | Status: AC
  Filled 2018-06-11: qty 2

## 2018-06-11 MED ORDER — SUCCINYLCHOLINE CHLORIDE 200 MG/10ML IV SOSY
PREFILLED_SYRINGE | INTRAVENOUS | Status: AC
  Filled 2018-06-11: qty 10

## 2018-06-11 MED ORDER — PROPOFOL 10 MG/ML IV BOLUS
INTRAVENOUS | Status: DC | PRN
  Administered 2018-06-11: 150 mg via INTRAVENOUS

## 2018-06-11 MED ORDER — HYDROMORPHONE HCL 1 MG/ML IJ SOLN
0.2500 mg | INTRAMUSCULAR | Status: DC | PRN
  Administered 2018-06-11 (×2): 0.5 mg via INTRAVENOUS
  Filled 2018-06-11: qty 0.5

## 2018-06-11 MED ORDER — DEXAMETHASONE SODIUM PHOSPHATE 10 MG/ML IJ SOLN
INTRAMUSCULAR | Status: DC | PRN
  Administered 2018-06-11: 10 mg via INTRAVENOUS

## 2018-06-11 MED ORDER — PROPOFOL 10 MG/ML IV BOLUS
INTRAVENOUS | Status: AC
  Filled 2018-06-11: qty 20

## 2018-06-11 MED ORDER — FENTANYL CITRATE (PF) 100 MCG/2ML IJ SOLN
INTRAMUSCULAR | Status: AC
  Filled 2018-06-11: qty 2

## 2018-06-11 MED ORDER — MEPERIDINE HCL 25 MG/ML IJ SOLN
6.2500 mg | INTRAMUSCULAR | Status: DC | PRN
  Filled 2018-06-11: qty 1

## 2018-06-11 MED ORDER — FENTANYL CITRATE (PF) 100 MCG/2ML IJ SOLN
INTRAMUSCULAR | Status: DC | PRN
  Administered 2018-06-11: 100 ug via INTRAVENOUS
  Administered 2018-06-11 (×2): 50 ug via INTRAVENOUS

## 2018-06-11 MED ORDER — ACETAMINOPHEN 500 MG PO TABS
ORAL_TABLET | ORAL | Status: AC
  Filled 2018-06-11: qty 2

## 2018-06-11 MED ORDER — LIDOCAINE 2% (20 MG/ML) 5 ML SYRINGE
INTRAMUSCULAR | Status: DC | PRN
  Administered 2018-06-11: 100 mg via INTRAVENOUS

## 2018-06-11 MED ORDER — OXYCODONE HCL 5 MG PO TABS: 10.0000 mg | ORAL_TABLET | Freq: Four times a day (QID) | ORAL | 0 refills | Status: AC | PRN

## 2018-06-11 MED ORDER — FLEET ENEMA 7-19 GM/118ML RE ENEM
1.0000 | ENEMA | Freq: Once | RECTAL | Status: DC
  Filled 2018-06-11: qty 1

## 2018-06-11 MED ORDER — LACTATED RINGERS IV SOLN
INTRAVENOUS | Status: DC
  Administered 2018-06-11 (×2): via INTRAVENOUS
  Filled 2018-06-11: qty 1000

## 2018-06-11 MED ORDER — BUPIVACAINE-EPINEPHRINE 0.25% -1:200000 IJ SOLN
INTRAMUSCULAR | Status: DC | PRN
  Administered 2018-06-11: 30 mL

## 2018-06-11 MED ORDER — ONDANSETRON HCL 4 MG/2ML IJ SOLN
INTRAMUSCULAR | Status: AC
  Filled 2018-06-11: qty 2

## 2018-06-11 MED ORDER — ONDANSETRON HCL 4 MG/2ML IJ SOLN
4.0000 mg | Freq: Once | INTRAMUSCULAR | Status: DC | PRN
  Filled 2018-06-11: qty 2

## 2018-06-11 SURGICAL SUPPLY — 44 items
BLADE EXTENDED COATED 6.5IN (ELECTRODE) ×2 IMPLANT
BLADE HEX COATED 2.75 (ELECTRODE) ×2 IMPLANT
BLADE SURG 10 STRL SS (BLADE) IMPLANT
BLADE SURG 15 STRL LF DISP TIS (BLADE) IMPLANT
BLADE SURG 15 STRL SS (BLADE) ×2
BRIEF STRETCH FOR OB PAD LRG (UNDERPADS AND DIAPERS) ×1 IMPLANT
COVER BACK TABLE 60X90IN (DRAPES) ×2 IMPLANT
COVER MAYO STAND STRL (DRAPES) ×2 IMPLANT
COVER WAND RF STERILE (DRAPES) ×4 IMPLANT
DRAPE LAPAROTOMY 100X72 PEDS (DRAPES) ×2 IMPLANT
DRAPE UTILITY XL STRL (DRAPES) ×2 IMPLANT
ELECT REM PT RETURN 9FT ADLT (ELECTROSURGICAL) ×2
ELECTRODE REM PT RTRN 9FT ADLT (ELECTROSURGICAL) ×1 IMPLANT
GAUZE SPONGE 4X4 12PLY STRL (GAUZE/BANDAGES/DRESSINGS) ×2 IMPLANT
GLOVE BIO SURGEON STRL SZ7.5 (GLOVE) ×2 IMPLANT
GLOVE INDICATOR 8.0 STRL GRN (GLOVE) ×2 IMPLANT
GOWN STRL REUS W/TWL XL LVL3 (GOWN DISPOSABLE) ×2 IMPLANT
KIT TURNOVER CYSTO (KITS) ×2 IMPLANT
NEEDLE HYPO 22GX1.5 SAFETY (NEEDLE) ×2 IMPLANT
NS IRRIG 500ML POUR BTL (IV SOLUTION) ×2 IMPLANT
PACK BASIN DAY SURGERY FS (CUSTOM PROCEDURE TRAY) ×2 IMPLANT
PAD ABD 8X10 STRL (GAUZE/BANDAGES/DRESSINGS) ×2 IMPLANT
PAD ARMBOARD 7.5X6 YLW CONV (MISCELLANEOUS) IMPLANT
PENCIL BUTTON HOLSTER BLD 10FT (ELECTRODE) ×2 IMPLANT
SPONGE HEMORRHOID 8X3CM (HEMOSTASIS) ×1 IMPLANT
SPONGE SURGIFOAM ABS GEL 100 (HEMOSTASIS) IMPLANT
SPONGE SURGIFOAM ABS GEL 12-7 (HEMOSTASIS) IMPLANT
SUT CHROMIC 2 0 SH (SUTURE) IMPLANT
SUT CHROMIC 3 0 SH 27 (SUTURE) IMPLANT
SUT MNCRL AB 4-0 PS2 18 (SUTURE) ×2 IMPLANT
SUT VIC AB 2-0 SH 27 (SUTURE) ×2
SUT VIC AB 2-0 SH 27XBRD (SUTURE) IMPLANT
SUT VIC AB 3-0 SH 27 (SUTURE) ×2
SUT VIC AB 3-0 SH 27X BRD (SUTURE) IMPLANT
SUT VIC AB 4-0 P-3 18XBRD (SUTURE) IMPLANT
SUT VIC AB 4-0 P3 18 (SUTURE)
SUT VIC AB 4-0 SH 18 (SUTURE) IMPLANT
SUT VIC AB 4-0 SH 27 (SUTURE)
SUT VIC AB 4-0 SH 27XANBCTRL (SUTURE) IMPLANT
SYR CONTROL 10ML LL (SYRINGE) ×2 IMPLANT
TRAY DSU PREP LF (CUSTOM PROCEDURE TRAY) ×2 IMPLANT
TUBE CONNECTING 12X1/4 (SUCTIONS) ×2 IMPLANT
WATER STERILE IRR 500ML POUR (IV SOLUTION) IMPLANT
YANKAUER SUCT BULB TIP NO VENT (SUCTIONS) ×2 IMPLANT

## 2018-06-11 NOTE — Transfer of Care (Addendum)
Immediate Anesthesia Transfer of Care Note  Patient: Emily [name]  Procedure(s) Performed: ANORECTAL EXAM UNDER ANESTHESIA WITH HEMORRHOIDECTOMY (N/A )  Patient Location: PACU  Anesthesia Type:General  Level of Consciousness: awake, alert  and oriented  Airway & Oxygen Therapy: Patient Spontanous Breathing and Patient connected to face mask oxygen  Post-op Assessment: Report given to RN and Post -op Vital signs reviewed and stable  Post vital signs: Reviewed and stable  Last Vitals:  Vitals Value Taken Time  BP 131/79 06/11/2018  1:00 PM  Temp    Pulse 80 06/11/2018  1:13 PM  Resp 12 06/11/2018  1:13 PM  SpO2 100 % 06/11/2018  1:13 PM  Vitals shown include unvalidated device data.  Last Pain:  Vitals:   06/11/18 1300  TempSrc:   PainSc: 6          Complications: No apparent anesthesia complications

## 2018-06-11 NOTE — Discharge Instructions (Addendum)
ANORECTAL SURGERY: POST OP INSTRUCTIONS  1. DIET: Follow a light bland diet the first 24 hours after arrival home, such as soup, liquids, crackers, etc.  Be sure to include lots of fluids daily.  Avoid fast food or heavy meals as your are more likely to get nauseated.  Eat a low fat diet the next few days after surgery.    2. Take your usually prescribed home medications unless otherwise directed.  3. PAIN CONTROL: a. It is helpful to take an over-the-counter pain medication regularly for the first few days/weeks.  Choose from the following that works best for you: i. Ibuprofen (Advil, etc) Three 200mg  tabs every 6 hours as needed. ii. Acetaminophen (Tylenol, etc) 500-650mg  every 6 hours as needed iii. NOTE: You may take both of these medications together - most patients find it most helpful when alternating between the two (i.e. Ibuprofen at 6am, tylenol at 9am, ibuprofen at 12pm ...) b. A  prescription for pain medication may have been prescribed for you at discharge. Roxicodone - take 1-2 tablets now more often than every 6 hours for severe pain not controlled with tylenol and ibuprofen.  Do NOT take a narcotic pain medication within 24 hours of taking any sedative medication. These can interact and may cause issues with breathing and even death from over sedation. c. You may additionally apply topical lidocaine to the area beginning 2 days after your procedure (Recticare, which is available over the counter at the pharmacy/grocery store in the hemorrhoid section and has also been provided to you at discharge from nursing here).  4. Avoid getting constipated.  Between the surgery and the pain medications, it is common to experience some constipation.  Increasing fluid intake (64oz of water per day) and taking a fiber supplement (such as Metamucil, Citrucel, FiberCon) 1-2 times a day regularly will usually help prevent this problem from occurring.  Additionally, you should take Miralax (over the  counter) 1-2x/day while taking a narcotic pain medication. If no bowel movement after 48hours, you may additionally take a laxative like a bottle of Milk of Magnesia which can be purchased over the counter. Avoid enemas if possible as these are often painful after having hemorrhoid surgery   5. Watch out for diarrhea.  If you have many loose bowel movements, simplify your diet to bland foods.  Stop any stool softeners and decrease your fiber supplement. If this worsens or does not improve, please call us.  6. Wash / shower every day.  If you were discharged with a dressing, you may remove this the day after your surgery. You may shower normally, getting soap/water on your wound, particularly after bowel movements. It is normal to experience some bleeding with bowel movements after hemorrhoid surgery. This should stop between bowel movements. If this continues and you continue to bleed/are passing large clots please contact us or return to the nearest emergency department for evaluation.  7. Soaking in a warm bath filled a couple inches ("Sitz bath") is a great way to clean the area after a bowel movement and many patients find it is a way to soothe the area. This helps reduce the pain associated with wiping.  8. ACTIVITIES as tolerated:   a. You may resume regular (light) daily activities beginning the next day--such as daily self-care, walking, climbing stairs--gradually increasing activities as tolerated.  If you can walk 30 minutes without difficulty, it is safe to try more intense activity such as jogging, treadmill, bicycling, low-impact aerobics, etc. b. Refrain from  any heavy lifting or straining for the first 2 weeks after your procedure, particularly if your surgery was for hemorrhoids. c. Avoid activities that make your pain worse d. You may drive when you are no longer taking prescription pain medication, you can comfortably wear a seatbelt, and you can safely maneuver your car and apply  brakes.  9. FOLLOW UP in our office a. Please call [facility] at [phone] to set up an appointment to see your surgeon in the office for a follow-up appointment approximately 2 weeks after your surgery. b. Make sure that you call for this appointment the day you arrive home to insure a convenient appointment time.  9. If you have disability or family leave forms that need to be completed, you may have them completed by your primary care physician's office; for return to work instructions, please ask our office staff and they will be happy to assist you in obtaining this documentation   When to call us [phone]: 1. Poor pain control 2. Reactions / problems with new medications (rash/itching, etc)  3. Fever over 101.5 F (38.5 C) 4. Inability to urinate 5. Nausea/vomiting 6. Worsening swelling or bruising 7. Continued bleeding from incision. 8. Increased pain, redness, or drainage from the incision  The clinic staff is available to answer your questions during regular business hours (8:30am-5pm).  Please dont hesitate to call and ask to speak to one of our nurses for clinical concerns.   A surgeon from [facility] is always on call at the hospitals   If you have a medical emergency, go to the nearest emergency room or call 911.   [facility], [address], [address], [place], [place]  [address] ? MAIN: [phone] FAX [phone] [email or web address]  Information for Discharge Teaching: EXPAREL (bupivacaine liposome injectable suspension)   Your surgeon or anesthesiologist gave you EXPAREL(bupivacaine) to help control your pain after surgery.   EXPAREL is a local anesthetic that provides pain relief by numbing the tissue around the surgical site.  EXPAREL is designed to release pain medication over time and can control pain for up to 72 hours.  Depending on how you respond to EXPAREL, you may require less pain medication  during your recovery.  Possible side effects:  Temporary loss of sensation or ability to move in the area where bupivacaine was injected.  Nausea, vomiting, constipation  Rarely, numbness and tingling in your mouth or lips, lightheadedness, or anxiety may occur.  Call your doctor right away if you think you may be experiencing any of these sensations, or if you have other questions regarding possible side effects.  Follow all other discharge instructions given to you by your surgeon or nurse. Eat a healthy diet and drink plenty of water or other fluids.  If you return to the hospital for any reason within 96 hours following the administration of EXPAREL, it is important for health care providers to know that you have received this anesthetic. A teal colored band has been placed on your arm with the date, time and amount of EXPAREL you have received in order to alert and inform your health care providers. Please leave this armband in place for the full 96 hours following administration, and then you may remove the band.  May remove green armband on Saturday.   Post Anesthesia Home Care Instructions  Activity: Get plenty of rest for the remainder of the day. A responsible individual must stay with you for 24 hours following  the procedure.  For the next 24 hours, DO NOT: -Drive a car -Paediatric nurse -Drink alcoholic beverages -Take any medication unless instructed by your physician -Make any legal decisions or sign important papers.  Meals: Start with liquid foods such as gelatin or soup. Progress to regular foods as tolerated. Avoid greasy, spicy, heavy foods. If nausea and/or vomiting occur, drink only clear liquids until the nausea and/or vomiting subsides. Call your physician if vomiting continues.  Special Instructions/Symptoms: Your throat may feel dry or sore from the anesthesia or the breathing tube placed in your throat during surgery. If this causes discomfort, gargle with  warm salt water. The discomfort should disappear within 24 hours.

## 2018-06-11 NOTE — Op Note (Signed)
06/11/2018  12:55 PM  PATIENT:  Emily [name]  38 y.o. female  Patient Care Team: Monico Blitz, MD as PCP - General (Internal Medicine) Gala Romney, Cristopher Estimable, MD as Consulting Physician (Gastroenterology)  PRE-OPERATIVE DIAGNOSIS:  Medically refractory hemorrhoids  POST-OPERATIVE DIAGNOSIS:  Same  PROCEDURE:   1. Hemorrhoidectomy, 2 colum 2. Anorectal exam under anesthesia  SURGEON:  Surgeon(s): Ileana Roup, MD  ASSISTANT: OR staff   ANESTHESIA:   general  SPECIMEN:   1. Hemorrhoid bundle, left lateral 2. Hemorrhoid bundle, right posterior  DISPOSITION OF SPECIMEN:  PATHOLOGY  COUNTS:  Sponge, needle, and instrument counts were reported correct x2 at conclusion.  EBL: 25 mL  Drains: None  PLAN OF CARE: Discharge to home after PACU  PATIENT DISPOSITION:  PACU - hemodynamically stable.  INDICATION: Ms. Watson is a very pleasant 59yoF with hx of multiple sclerosis, migraines, chronic pain related to MS (Previously on Belbuca - oral dissolving buprenorphine but states she has now been off this for over 2 months, denies taking ANY narcotic medication in the last 2 mo) presented to our office for evaluation of possible hemorrhoids in 2018. She described a 90yr hx of anal tissue prolapse which was bothersome. She noted anal canal tissue prolapse with BMs that occasionally requires manual reduction but often reduces on it's own. OCcasional bright red blood per rectum. Was seen 10/21/16 by Dr. Sallyanne Havers for a thrombosed external hemorrhoid and extreme pain - he lanced. She has had siginificant improvement in her pain.  When seen last, she denied daily fiber supplementation nor laxatives. Had BM every 2-3 days, intermittent constipation. Spended 10-15 minutes on the commode with each BM. Last colonoscopy 2008 at time of stoma reversal; done by Dr. Tacy Dura - she reports it was normal  She underwent colonoscopy by myself 01/29/17 which demonstrated nonthrombosed external and  internal hemorrhoids. Multiple smallmouth diverticula in the sigmoid/descending colon, patent functional end-to-end colocolonic anastomosis. No polyps were identified. She is due for repeat in 5 years. She is here today for follow-up regarding her hemorrhoids. She has had 2-3 subsequent episodes of what she describes as thromboses that were lanced. She is now taking a fiber supplement every day as well as daily MiraLAX. She reports having 1 soft/formed bowel movement per day. She typically spends to 3 minutes on commode but occasionally spends more time if she is having pain that she attributes to a passing kidney stone. Despite all of her dietary and lifestyle modifications she still continues to have symptoms. She returned to the office for follow-up requesting surgery for her hemorrhoids. She denies any bleeding per rectum. She reports discomfort with BMs and tissue prolapse, intermittent episodes of thrombosis.   She brought with her to her last visit, pictures on her phone that she stated her pictures of her anus when her hemorrhoids are "flared." This showed what appeared to be 3 column external hemorrhoids that were edematous.  She was found to have mixed external/internal hemorrhoids on exam.  At this point, these of been deemed to be medically refractory.  We had discussed options moving forward including hemorrhoidectomy.  Please refer to notes elsewhere for details regarding these discussions.  She opted to pursue surgery to address her hemorrhoids.  OR FINDINGS: Large and edematous hemorrhoids-most significant being in the left lateral and right posterior positions.  Given the extent of the external component of her hemorrhoids, a 3 column hemorrhoidectomy was not elected to be performed to reduce the risk of anal stenosis.  Right anterior  bundle relatively small however.  Both the left lateral and right posterior hemorrhoidal bundle were removed and both were also found to contain  clot within the external hemorrhoid component consistent with ongoing thromboses.  DESCRIPTION: The patient was identified in the preoperative holding area and taken to the OR where SCDs were placed.  General endotracheal anesthesia was induced without difficulty.  She was then rolled onto the operating table in the prone position.  She had reportedly experienced issues with an adhesive allergy in the past but had not had issues with Tegaderm.  To facilitate exposure, taping of the buttocks was necessary.  Prior to doing this though, large Tegaderms were placed over both buttock and skin to protect any tape from contacting the skin. She was then prepped and draped in usual sterile fashion.  A surgical timeout was performed indicating the correct patient, procedure, and positioning. A perianal block was performed using a dilute mixture of Exparel and 0.25% Marcaine with epinephrine.  Large somewhat edematous external hemorrhoids were present-most pronounced left lateral and right posterior.  A well lubricated digital rectal exam was performed which demonstrated a smooth anal canal without palpable masses.  A Hill-Ferguson anoscope was into the anal canal and circumferential inspection demonstrated small associated internal hemorrhoids in the left lateral and right posterior positions.  Given the extent of her external component of her hemorrhoids a 2 column hemorrhoidectomy is planned.  She does have a small third column in the right anterior position, however, removal of all 3 columns would certainly increase her risk of anal stenosis given the degree of involvement externally.  The silver bullet anoscope was then inserted.  The left lateral bundle was addressed first.  The hemorrhoidal tissue was bunched up with a DeBakey.  The planned area of excision was marked, minimizing the amount of perianal skin that was removed.  The hemorrhoidal bundle was then excised sharply and passed off as specimen.  The  hemorrhoidal vessel was then ligated with two 2-0 vicryl figure-of-eight sutures.  The pedicle was inspected and noted to be hemostatic.  Additional external hemorrhoidal tissue overlying the sphincter muscle was carefully removed with electrocautery.  Care was taken to avoid injuring any underlying sphincter muscle.  The surgical site was irrigated and hemostasis achieved.  The mucosa was then approximated with a running locking 3-0 Vicryl suture.  The perianal skin was approximated using a running 4-0 Monocryl suture.  The right posterior bundle was addressed next.  The hemorrhoidal tissue was bunched up with a DeBakey.  The planned area of excision was marked, minimizing the amount of perianal skin that was removed.  The hemorrhoidal bundle was then excised sharply and passed off as specimen.  The hemorrhoidal vessel was then ligated with two 2-0 vicryl figure-of-eight sutures.  The pedicle was inspected and noted to be hemostatic.  Additional external hemorrhoidal tissue overlying the sphincter muscle was carefully removed with electrocautery.  Care was taken to avoid injuring any underlying sphincter muscle.  The surgical site was irrigated and hemostasis achieved.  The mucosa was then approximated with a running locking 3-0 Vicryl suture.  The perianal skin was approximated using a running 4-0 Monocryl suture.  The medium-sized Hill-Ferguson anoscope was reinserted and the anal canal accommodated this without a problem.  The anal canal was reinspected, irrigated, and noted to be completely hemostatic.  Additional anesthetic was infiltrated into the area of excisions.  The area was cleaned.  A piece of Proctofoam was placed in the anal canal after coating  it and dibucaine ointment.  4 x 4 gauze followed by ABD and mesh underwear were then used as a dressing.  She was then transferred back onto a stretcher off of the OR table, awakened from anesthesia, extubated, and transferred to a stretcher for  transport to PACU in satisfactory condition  DISPOSITION: PACU in satisfactory condition.

## 2018-06-11 NOTE — Anesthesia Preprocedure Evaluation (Addendum)
Anesthesia Evaluation  Patient identified by MRN, date of birth, ID band Patient awake    Reviewed: Allergy & Precautions, NPO status , Patient's Chart, lab work & pertinent test results  Airway Mallampati: II  TM Distance: >3 FB Neck ROM: Full    Dental  (+) Dental Advisory Given, Missing,    Pulmonary former smoker,    Pulmonary exam normal        Cardiovascular hypertension, Pt. on medications Normal cardiovascular exam     Neuro/Psych Anxiety Depression H/O MS    GI/Hepatic GERD  Medicated and Controlled,  Endo/Other    Renal/GU      Musculoskeletal   Abdominal   Peds  Hematology   Anesthesia Other Findings   Reproductive/Obstetrics                            Anesthesia Physical Anesthesia Plan  ASA: III  Anesthesia Plan: General   Post-op Pain Management:    Induction: Intravenous  PONV Risk Score and Plan: 3 and Ondansetron, Midazolam and Treatment may vary due to age or medical condition  Airway Management Planned: Oral ETT  Additional Equipment:   Intra-op Plan:   Post-operative Plan: Extubation in OR  Informed Consent:   Plan Discussed with: CRNA and Surgeon  Anesthesia Plan Comments:         Anesthesia Quick Evaluation

## 2018-06-11 NOTE — Anesthesia Procedure Notes (Signed)
Procedure Name: Intubation Date/Time: 06/11/2018 11:19 AM Performed by: Genelle Bal, CRNA Pre-anesthesia Checklist: Patient identified, Emergency Drugs available, Suction available and Patient being monitored Patient Re-evaluated:Patient Re-evaluated prior to induction Oxygen Delivery Method: Circle system utilized Preoxygenation: Pre-oxygenation with 100% oxygen Induction Type: IV induction Ventilation: Mask ventilation without difficulty Laryngoscope Size: Miller and 2 Grade View: Grade I Tube type: Oral Tube size: 7.0 mm Number of attempts: 1 Airway Equipment and Method: Stylet and Oral airway Placement Confirmation: ETT inserted through vocal cords under direct vision,  positive ETCO2 and breath sounds checked- equal and bilateral Secured at: 21 cm Tube secured with: Tape Dental Injury: Teeth and Oropharynx as per pre-operative assessment

## 2018-06-11 NOTE — Anesthesia Postprocedure Evaluation (Signed)
Anesthesia Post Note  Patient: Emily [name]  Procedure(s) Performed: ANORECTAL EXAM UNDER ANESTHESIA WITH HEMORRHOIDECTOMY (N/A )     Patient location during evaluation: PACU Anesthesia Type: General Level of consciousness: awake and alert Pain management: pain level controlled Vital Signs Assessment: post-procedure vital signs reviewed and stable Respiratory status: spontaneous breathing, nonlabored ventilation, respiratory function stable and patient connected to nasal cannula oxygen Cardiovascular status: blood pressure returned to baseline and stable Postop Assessment: no apparent nausea or vomiting Anesthetic complications: no    Last Vitals:  Vitals:   06/11/18 1330 06/11/18 1405  BP: 134/80 131/90  Pulse: 82 86  Resp: 10 20  Temp:  36.8 C  SpO2: 96% 98%    Last Pain:  Vitals:   06/11/18 1405  TempSrc:   PainSc: 0-No pain                 Atwell Mcdanel DAVID

## 2018-06-11 NOTE — H&P (Signed)
CC: Here today for surgery  HPI: with hx of multiple sclerosis, migraines, chronic pain related to MS (Previously on Belbuca - oral dissolving buprenorphine but states she has now been off this for over 2 months, denies taking ANY narcotic medication in the last 2 mo) presented to our office for evaluation of possible hemorrhoids in 2018. She described a 29yr hx of anal tissue prolapse which was bothersome. She noted anal canal tissue prolapse with BMs that occasionally requires manual reduction but often reduces on it's own. OCcasional bright red blood per rectum. Was seen 10/21/16 by Dr. Sallyanne Havers for a thrombosed external hemorrhoid and extreme pain - he lanced. She has had siginificant improvement in her pain.  When seen last, she denied daily fiber supplementation nor laxatives. Had BM every 2-3 days, intermittent constipation. Spended 10-15 minutes on the commode with each BM. Last colonoscopy 2008 at time of stoma reversal; done by Dr. Tacy Dura - she reports it was normal  She underwent colonoscopy by myself 01/29/17 which demonstrated nonthrombosed external and internal hemorrhoids. Multiple smallmouth diverticula in the sigmoid/descending colon, patent functional end-to-end colocolonic anastomosis. No polyps were identified. She is due for repeat in 5 years. She is here today for follow-up regarding her hemorrhoids. She has had 2-3 subsequent episodes of what she describes as thromboses that were lanced. She is now taking a fiber supplement every day as well as daily MiraLAX. She reports having 1 soft/formed bowel movement per day. She typically spends to 3 minutes on commode but occasionally spends more time if she is having pain that she attributes to a passing kidney stone. Despite all of her dietary and lifestyle modifications she still continues to have symptoms. She returned to the office for follow-up requesting surgery for her hemorrhoids. She denies any bleeding per rectum. She  reports discomfort with BMs and tissue prolapse, intermittent episodes of thrombosis.   She brought with her to her last visit, pictures on her phone that she stated her pictures of her anus when her hemorrhoids are "flared." This showed what appeared to be 3 column external hemorrhoids that were edematous.  PMH: Multiple sclerosis (currently controlled - following with neurology) , diverticulitis, chronic pain related to MS (Following with pain management, taking PO buprenoprphine), migraines  PSH: C-sx x1; exploratory laparoscopy and what sounds to be sigmoid colectomy with end colostomy in [place], 2008; subsequent Hartman's reversal; multiple laparotomies and 2 prior ventral hernia repairs including mesh placement  ZOX:WRUEAV family hx of malignancy  Social: Denies use of tobacco/EtOH/drug use.   Past Medical History:  Diagnosis Date  . Anxiety   . Asthma as a child  . Chronic back pain   . Chronic pain   . Conversion disorder   . DDD (degenerative disc disease), cervical   . Depression   . External hemorrhoid   . Fatty liver   . GERD (gastroesophageal reflux disease)   . History of kidney stones   . HTN (hypertension) 02/27/2015  . JC virus antibody positive   . Migraines   . MS (multiple sclerosis) ([place])    06-2006  . Neuropathy   . OA (osteoarthritis)   . Ovarian cyst   . Panic attack   . Perforated bowel ([place]) 2009   colostomy bag for 3 motnhs  . 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 PTSD (post-traumatic stress disorder)   . S/P emergency C-section   . Seasonal allergies   . Urinary urgency   .  Vertigo   . Wears contact lenses   . Wears glasses     Past Surgical History:  Procedure Laterality Date  . ABDOMINAL SURGERY    . APPENDECTOMY    . BOWEL RESECTION  01/2007   with colostomy  . CESAREAN SECTION N/A 07/12/2015   Procedure: CESAREAN SECTION;  Surgeon: Guss Bunde, MD;  Location: [facility];  Service:  Obstetrics;  Laterality: N/A;  . COLONOSCOPY WITH PROPOFOL N/A 01/29/2017   Procedure: COLONOSCOPY WITH PROPOFOL;  Surgeon: Ileana Roup, MD;  Location: [facility];  Service: General;  Laterality: N/A;  . COLOSTOMY CLOSURE  04/2007  . EXTREMITY CYST EXCISION  1994   right leg  . HERNIA REPAIR    . INCISIONAL HERNIA REPAIR N/A 02/16/2016   Procedure: HERNIA REPAIR INCISIONAL;  Surgeon: Fanny Skates, MD;  Location: [facility];  Service: General;  Laterality: N/A;  . INSERTION OF MESH  02/16/2016   Procedure: INSERTION OF MESH;  Surgeon: Fanny Skates, MD;  Location: [facility];  Service: General;;  . RADIOLOGY WITH ANESTHESIA N/A 03/06/2017   Procedure: MRI WITH ANESTHESIA OF CERVICAL SPINE WITHOUT CONTRAST, MRI OF LUMBAR SPINE WITHOUT CONTRAST;  Surgeon: Radiologist, Medication, MD;  Location: [facility];  Service: Radiology;  Laterality: N/A;  . SCAR REVISION  01/21/2011   Procedure: SCAR REVISION;  Surgeon: Hermelinda Dellen;  Location: [facility];  Service: Plastics;  Laterality: N/A;  exploration of scar of abdomen and repair of defect  . TOOTH EXTRACTION Left 10/2016    Family History  Problem Relation Age of Onset  . Diabetes Mother   . Hypertension Mother   . Diabetes Father   . Hypertension Father   . Arthritis Father   . Cancer Maternal Grandmother   . Cancer Maternal Grandfather   . Alcohol abuse Neg Hx   . Anxiety disorder Neg Hx   . Bipolar disorder Neg Hx   . Drug abuse Neg Hx   . Depression Neg Hx   . Colon cancer Neg Hx     Social:  reports that she quit smoking about 2 years ago. Her smoking use included cigarettes. She has a 2.50 pack-year smoking history. She has never used smokeless tobacco. She reports that she does not drink alcohol or use drugs.  Allergies:  Allergies  Allergen Reactions  . Amitriptyline Hypertension and Other (See Comments)    hypertension  . Baclofen Hives and Shortness Of Breath  . Duloxetine Hcl Shortness Of Breath and Rash   . Gabapentin Shortness Of Breath and Rash  . Hydrocodone-Acetaminophen Hives and Nausea And Vomiting    Projectile vomiting  . Magnesium Salicylate Hives and Itching  . Modafinil Other (See Comments)    Seizures   . Monosodium Glutamate Anaphylaxis  . Other Shortness Of Breath, Rash and Other (See Comments)    MSG, beans (vomiting) MSG causes hives, itching, throat swelling  . Tizanidine Hives    Other reaction(s): GI Upset (intolerance)  . Alprazolam Other (See Comments)    Lethargy  . Rizatriptan Nausea And Vomiting and Other (See Comments)    GI upset, Projectile vomiting GI upset, Projectile vomiting  . Vicodin [Hydrocodone-Acetaminophen] Hives, Nausea And Vomiting and Other (See Comments)    Projectile vomiting  . Adhesive [Tape] Other (See Comments)    Skin irritation Paper tape is ok  . Ketorolac Tromethamine Nausea Only  . Lamotrigine Rash  . Tramadol Nausea And Vomiting    Other reaction(s): GI Upset (intolerance)  Medications: I have reviewed the patient's current medications.  Results for orders placed or performed during the hospital encounter of 06/11/18 (from the past 48 hour(s))  Pregnancy, urine POC     Status: None   Collection Time: 06/11/18  9:01 AM  Result Value Ref Range   Preg Test, Ur NEGATIVE NEGATIVE    Comment:        THE SENSITIVITY OF THIS METHODOLOGY IS >24 mIU/mL     No results found.  ROS - all of the below systems have been reviewed with the patient and positives are indicated with bold text General: chills, fever or night sweats Eyes: blurry vision or double vision ENT: epistaxis or sore throat Allergy/Immunology: itchy/watery eyes or nasal congestion Hematologic/Lymphatic: bleeding problems, blood clots or swollen lymph nodes Endocrine: temperature intolerance or unexpected weight changes Breast: new or changing breast lumps or nipple discharge Resp: cough, shortness of breath, or wheezing CV: chest pain or dyspnea on exertion  GI: as per HPI GU: dysuria, trouble voiding, or hematuria MSK: joint pain or joint stiffness Neuro: TIA or stroke symptoms Derm: pruritus and skin lesion changes Psych: anxiety and depression  PE Blood pressure (!) 163/109, pulse 90, temperature 98.3 F (36.8 C), temperature source Oral, resp. rate 18, height 5\' 4"  (1.626 m), weight 121.9 kg, last menstrual period 05/11/2018, SpO2 99 %. Constitutional: NAD; conversant; no deformities Eyes: Moist conjunctiva; no lid lag; anicteric; PERRL Neck: Trachea midline; no thyromegaly Lungs: Normal respiratory effort; no tactile fremitus CV: RRR; no palpable thrills; no pitting edema GI: Abd soft, NT/ND; no palpable hepatosplenomegaly MSK: Normal gait; no clubbing/cyanosis Psychiatric: Appropriate affect; alert and oriented x3 Lymphatic: No palpable cervical or axillary lymphadenopathy  Results for orders placed or performed during the hospital encounter of 06/11/18 (from the past 48 hour(s))  Pregnancy, urine POC     Status: None   Collection Time: 06/11/18  9:01 AM  Result Value Ref Range   Preg Test, Ur NEGATIVE NEGATIVE    Comment:        THE SENSITIVITY OF THIS METHODOLOGY IS >24 mIU/mL    No results found.   A/P: Ms. Souders is a very pleasant 73yoF with hx of MS, migraines, chronic pain (previously taking PO buprenorphine - follows with Dr. ) presents for follow-up evaluation of her hemorrhoidal disease  -She has tried medical management with daily fiber supplementation and MiraLAX as well as increasing her water intake. She has been minimizing time on commode. She's now having soft/formed bowel movements daily. Despite these maneuvers she is still having episodes of recurrent thromboses and problems related to her hemorrhoids and has requested [place] to address them surgically.  -The anatomy and physiology of the anal canal was discussed at length with the patient again today. The pathophysiology of hemorrhoids was was reviewed at length  with associated pictures and illustrations as well -We discussed hemorrhoidectomy, anorectal exam under anesthesia -The procedure including technique, material risks (including, but not limited to, pain, bleeding, infection, pelvic sepsis which is rare and in certain cases can require diverting stoma, scarring, anal stenosis, need for blood transfusion, need for additional procedures, damage to anal sphincter, incontinence of gas and/or stool, need for additional procedures, recurrence, pneumonia, heart attack, stroke, death) benefits and alternatives to surgery were discussed at length. I noted a good probability that the procedure would help improve her symptoms. We have again discussed possibility of not removing all hemorrhoidal tissue at this operation but going after the largest hemorrhoidal tissue if removal of  all tissue would be felt to place her at high risk for anal stenosis. The patient's questions were answered to her satisfaction, she voiced understanding and elected to proceed with surgery. Additionally, we discussed typical postoperative expectations and the recovery process. -We have discussed pain management post procedure. She has been off narcotic medications for 2 months. She is now seeing Dr. Dossie Arbour from pain management for back injections and she has been managed on PO pregabalin. We will attempt to make things as comfortable as possible including use of Exparel. I have discussed taking tylenol and ibuprofen initially as well as topical lidocaine (recticare), fiber and miralax. We discussed that a prescription for oxycodone will be provided for SEVERE pain that is not controlled with above medications first. We discussed that this would be a 1 week supply. We discussed that if additional pain medication was necessary for pain control, she would need to obtain this from her pain management specialist. She has expressed understanding and agreement with this plain.  Sharon Mt. Dema Severin,  M.D. General and Colorectal Surgery [facility]

## 2018-06-12 ENCOUNTER — Encounter (HOSPITAL_BASED_OUTPATIENT_CLINIC_OR_DEPARTMENT_OTHER): Payer: Self-pay | Admitting: Surgery

## 2018-06-15 ENCOUNTER — Telehealth: Payer: Self-pay | Admitting: Pain Medicine

## 2018-06-15 ENCOUNTER — Other Ambulatory Visit: Payer: Self-pay | Admitting: Nurse Practitioner

## 2018-06-15 DIAGNOSIS — R112 Nausea with vomiting, unspecified: Secondary | ICD-10-CM

## 2018-06-15 NOTE — Telephone Encounter (Signed)
Pt left voicemail stating that she had a hemorrhoidectomy and that the surgeon gave her oxy 5mg  for acute pain and was letting us know

## 2018-06-15 NOTE — Telephone Encounter (Signed)
Note added to chart

## 2018-06-16 ENCOUNTER — Ambulatory Visit: Payer: Medicare Other | Admitting: Pain Medicine

## 2018-06-18 ENCOUNTER — Telehealth: Payer: Self-pay | Admitting: *Deleted

## 2018-06-19 ENCOUNTER — Encounter (HOSPITAL_COMMUNITY): Payer: Self-pay

## 2018-06-19 ENCOUNTER — Other Ambulatory Visit (HOSPITAL_COMMUNITY): Payer: Medicare Other

## 2018-06-19 ENCOUNTER — Ambulatory Visit (HOSPITAL_COMMUNITY): Admission: RE | Admit: 2018-06-19 | Payer: Medicare Other | Source: Ambulatory Visit

## 2018-06-21 DIAGNOSIS — R531 Weakness: Secondary | ICD-10-CM | POA: Diagnosis not present

## 2018-06-21 DIAGNOSIS — R569 Unspecified convulsions: Secondary | ICD-10-CM | POA: Diagnosis not present

## 2018-06-21 DIAGNOSIS — Z87891 Personal history of nicotine dependence: Secondary | ICD-10-CM | POA: Diagnosis not present

## 2018-06-21 DIAGNOSIS — M1990 Unspecified osteoarthritis, unspecified site: Secondary | ICD-10-CM | POA: Diagnosis not present

## 2018-06-21 DIAGNOSIS — R809 Proteinuria, unspecified: Secondary | ICD-10-CM | POA: Diagnosis not present

## 2018-06-21 DIAGNOSIS — R918 Other nonspecific abnormal finding of lung field: Secondary | ICD-10-CM | POA: Diagnosis not present

## 2018-06-21 DIAGNOSIS — F449 Dissociative and conversion disorder, unspecified: Secondary | ICD-10-CM | POA: Diagnosis not present

## 2018-06-21 DIAGNOSIS — F444 Conversion disorder with motor symptom or deficit: Secondary | ICD-10-CM | POA: Diagnosis not present

## 2018-06-21 DIAGNOSIS — Z79899 Other long term (current) drug therapy: Secondary | ICD-10-CM | POA: Diagnosis not present

## 2018-06-21 DIAGNOSIS — G35 Multiple sclerosis: Secondary | ICD-10-CM | POA: Diagnosis not present

## 2018-06-22 ENCOUNTER — Telehealth (HOSPITAL_COMMUNITY): Payer: Self-pay

## 2018-06-22 ENCOUNTER — Telehealth: Payer: Self-pay

## 2018-06-22 ENCOUNTER — Telehealth: Payer: Self-pay | Admitting: Neurology

## 2018-06-22 NOTE — Telephone Encounter (Signed)
Patient called to inform us that she was in the hospital last night with pseudo seizures, she is fine now and at home, she just wanted to let us know.

## 2018-06-22 NOTE — Telephone Encounter (Signed)
Patient was calling to let you know she was at [facility] in [place] over the weekend in the hospital. This was witnessed by her parents- conversion disorder; 2 Pseudoseizures at home and one in the hospital (they think that's what it was in hospital). Just FYI. She was instructed to let all her Dr.s know. Thanks!

## 2018-06-22 NOTE — Telephone Encounter (Signed)
Recorded in chart under medications

## 2018-06-22 NOTE — Telephone Encounter (Signed)
noted 

## 2018-06-22 NOTE — Telephone Encounter (Signed)
She called to let us know she had surgery on 06/11/18 and he gave her oxycodone 5mg  take 2 every 6 hours and gave her a weeks worth.  Also she went to ER  [facility] over the weekend for pseudoseizures. She had 2. She was in the ER for a little while and they told her to let all her specialty doctors know.

## 2018-06-23 ENCOUNTER — Encounter: Payer: Self-pay | Admitting: Pain Medicine

## 2018-06-23 ENCOUNTER — Telehealth: Payer: Self-pay

## 2018-06-23 NOTE — Telephone Encounter (Signed)
Called to confirm with Pt she is using Aimovig and will not be coming for Botox on Friday 6/19. She confirmed using Aimovig. Will cancel Botox appt.  Pt wanted to let us know she fell yesterday afternoon while going into a store. I questioned her if she tripped, or slipped due to the rain, she stated she did not, she just collapsed. She had to lay there until "things stopped spinning". A patron in the store came out and assisted her off the ground. Other than a scrape on her arm, she was not injured.  The Pt recently had an hemorrhoidectomy, states is still bleeding, she is also on her period. She has called the surgeons office to let them know of the continued bleeding.  I suggested she may want to increase her protein intake to help with healing and increase hemoglobin. I advised her to stay well hydrated also.

## 2018-06-23 NOTE — Telephone Encounter (Signed)
Agree with above, no further suggestions. Thanks!

## 2018-06-23 NOTE — Progress Notes (Signed)
Pain Management Virtual Encounter Note - Virtual Visit via Telephone Telehealth (real-time audio visits between healthcare provider and patient).   Patient's Phone No. & Preferred Pharmacy:  [phone] (home); [phone] (mobile); (Preferred) [phone] [email or web address]  [id number] [place], [place] [address] [place] [address] Phone: [phone] Fax: [phone]    Pre-screening note:  Our staff contacted Emily [name] and offered her an "in person", "face-to-face" appointment versus a telephone encounter. She indicated preferring the telephone encounter, at this time.   Reason for Virtual Visit: COVID-19*  Social distancing based on CDC and AMA recommendations.   I contacted Emily [name] on 06/24/2018 via telephone.      I clearly identified myself as Gaspar Cola, MD. I verified that I was speaking with the correct person using two identifiers (Name: Emily [name], and date of birth: Oct 12, 1980).  Advanced Informed Consent I sought verbal advanced consent from Emily [name] for virtual visit interactions. I informed Emily [name] of possible security and privacy concerns, risks, and limitations associated with providing "not-in-person" medical evaluation and management services. I also informed Emily [name] of the availability of "in-person" appointments. Finally, I informed her that there would be a charge for the virtual visit and that she could be  personally, fully or partially, financially responsible for it. Emily [name] expressed understanding and agreed to proceed.   Historic Elements   Emily [name] is a 38 y.o. year old, female patient evaluated today after her last encounter by our practice on 06/22/2018. Emily [name]  has a past medical history of Anxiety, Asthma (as a child), Chronic back pain, Chronic pain, Conversion disorder, DDD (degenerative disc disease), cervical, Depression, External  hemorrhoid, Fatty liver, GERD (gastroesophageal reflux disease), History of kidney stones, HTN (hypertension) (02/27/2015), JC virus antibody positive, Migraines, MS (multiple sclerosis) ([place]), Neuropathy, OA (osteoarthritis), Ovarian cyst, Panic attack, Perforated bowel ([place]) (2009), Pseudoseizures, PTSD (post-traumatic stress disorder), S/P emergency C-section, Seasonal allergies, Urinary urgency, Vertigo, Wears contact lenses, and Wears glasses. 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 Emily [name] has a current medication list which includes the following prescription(s): albuterol, amlodipine, buspirone, diazepam, divalproex, dronabinol, epinephrine, erenumab-aooe, etonogestrel, fingolimod hcl, fluoxetine, lithium carbonate, loratadine, methocarbamol, multiple vitamins-minerals, NON FORMULARY, omeprazole, pregabalin, ranitidine, and topiramate, and the following Facility-Administered Medications: metoclopramide. She  reports that she quit smoking about 2 years ago. Her smoking use included cigarettes. She has a 2.50 pack-year smoking history. She has never used smokeless tobacco. She reports that she does not drink alcohol or use drugs. Emily [name] is allergic to amitriptyline; baclofen; duloxetine hcl; gabapentin; hydrocodone-acetaminophen; magnesium salicylate; modafinil; monosodium glutamate; other; tizanidine; alprazolam; rizatriptan; vicodin [hydrocodone-acetaminophen]; adhesive [tape]; ketorolac tromethamine; lamotrigine; and tramadol.   HPI  Today,  she is being contacted for medication management.  The patient indicates having done well with her current medication regimen which consist of Lyrica 100 mg p.o. 3 times daily and Robaxin 750 mg 1 tablet p.o. 3 times daily.  Currently I do not have this patient on any opioids.  This patient describes no adverse reactions or side effects to any of  these medications.  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  On 03/24/2018 she underwent her first diagnostic lumbar facet block which provided her with 100% relief of the pain for 1.5 weeks.  We then went ahead and schedule the patient to have her second diagnostic injection for the purpose of confirming that the results on the first diagnostic test were accurate and reproducible.  Unfortunately, COVID-19 hit and this was postponed.  We then later rescheduled but the patient had to undergo a hemorrhoidectomy on 06/11/2018 and again had to cancel.  She has her postop follow-up for this surgery coming up this next week and if she has clearance from her surgeon we will then proceed with the scheduled bilateral lumbar facet block #2 under fluoroscopic guidance and IV sedation.  Once again, the long-term plan here is to proceed with radiofrequency ablation to provide her with a longer lasting benefit so asked to be able to back down some of her medications.  Today the patient had a significant amount of information that she thought she should share with me and therefore this visit took quite a while then in several locations I have to refocus her to the topic.  Pertinent Labs   SAFETY SCREENING Profile Lab Results  Component Value Date   SARSCOV2NAA NOT DETECTED 06/08/2018   COVIDSOURCE NASOPHARYNGEAL 06/08/2018   STAPHAUREUS NEGATIVE 02/16/2016   MRSAPCR NEGATIVE 02/16/2016   HIV NONREACTIVE 05/22/2015   PREGTESTUR NEGATIVE 06/11/2018   Renal Function Lab Results  Component Value Date   BUN 13 06/08/2018   CREATININE 0.63 06/08/2018    BCR 16 03/16/2018   GFRAA >60 06/08/2018   GFRNONAA >60 06/08/2018   Hepatic Function Lab Results  Component Value Date   AST 18 06/08/2018   ALT 15 06/08/2018   ALBUMIN 4.0 06/08/2018   UDS Summary  Date Value Ref Range Status  03/16/2018 FINAL  Final    Comment:    ==================================================================== TOXASSURE COMP DRUG ANALYSIS,UR ==================================================================== Test                             Result       Flag       Units Drug Present and Declared for Prescription Verification   Desmethyldiazepam              214          EXPECTED   ng/mg creat   Oxazepam                       1241         EXPECTED   ng/mg creat   Temazepam                      1052         EXPECTED   ng/mg creat    Desmethyldiazepam, oxazepam, and temazepam are expected    metabolites of diazepam. Desmethyldiazepam and oxazepam are also    expected metabolites of other drugs, including chlordiazepoxide,    prazepam, clorazepate, and halazepam. Oxazepam is an expected    metabolite of temazepam. Oxazepam and temazepam are also    available as scheduled prescription medications.   Pregabalin                     PRESENT      EXPECTED   Topiramate  PRESENT      EXPECTED   Methocarbamol                  PRESENT      EXPECTED   Fluoxetine                     PRESENT      EXPECTED   Norfluoxetine                  PRESENT      EXPECTED    Norfluoxetine is an expected metabolite of fluoxetine. Drug Absent but Declared for Prescription Verification   Prochlorperazine               Not Detected UNEXPECTED   Ibuprofen                      Not Detected UNEXPECTED    Ibuprofen, as indicated in the declared medication list, is not    always detected even when used as directed. ==================================================================== Test                      Result    Flag   Units      Ref Range   Creatinine               29               mg/dL      >=20 ==================================================================== Declared Medications:  The flagging and interpretation on this report are based on the  following declared medications.  Unexpected results may arise from  inaccuracies in the declared medications.  **Note: The testing scope of this panel includes these medications:  Diazepam (Valium)  Fluoxetine (Prozac)  Methocarbamol (Robaxin)  Pregabalin (Lyrica)  Prochlorperazine (Compazine)  Topiramate (Topamax)  **Note: The testing scope of this panel does not include small to  moderate amounts of these reported medications:  Ibuprofen  **Note: The testing scope of this panel does not include following  reported medications:  Albuterol  Amlodipine (Norvasc)  Buspirone (BuSpar)  Divalproex (Depakote)  Epinephrine (EpiPen)  Etonogestrel (Nexplanon)  Fingolimod ([place])  Lithium (Lithobid)  Loratadine (Claritin)  Multivitamin  Ranitidine (Zantac) ==================================================================== For clinical consultation, please call [phone]. ====================================================================    Note: Above Lab results reviewed.  Recent imaging  DG C-Arm 1-60 Min-No Report Fluoroscopy was utilized by the requesting physician.  No radiographic  interpretation.   Assessment  The primary encounter diagnosis was Chronic pain syndrome. Diagnoses of Lumbar facet syndrome (Bilateral) (R>L), Chronic low back pain (Secondary Area of Pain) (Bilateral) (L>R), Chronic upper back pain (Primary Area of Pain) (Bilateral) (R>L), Chronic knee pain (Fifth Area of Pain) (Left), Chronic musculoskeletal pain, and Neurogenic pain were also pertinent to this visit.  Plan of Care  I have changed Emily [name]'s methocarbamol. I am also having her maintain her ranitidine, EPINEPHrine, albuterol, Multiple Vitamins-Minerals (MULTIVITAMIN ADULT PO),  amLODipine, etonogestrel, loratadine, dronabinol, Fingolimod HCl, topiramate, divalproex, Erenumab-aooe, busPIRone, FLUoxetine, lithium carbonate, diazepam, NON FORMULARY, omeprazole, and pregabalin. We will stop administering metoCLOPramide (REGLAN) 10 mg in dextrose 5 % 50 mL IVPB.  Pharmacotherapy (Medications Ordered): Meds ordered this encounter  Medications  . methocarbamol (ROBAXIN) 750 MG tablet    Sig: Take 1 tablet (750 mg total) by mouth every 8 (eight) hours as needed for muscle spasms.    Dispense:  270 tablet    Refill:  0    Fill one day early  if pharmacy is closed on scheduled refill date. May substitute for generic if available.  . pregabalin (LYRICA) 100 MG capsule    Sig: Take 1 capsule (100 mg total) by mouth 3 (three) times daily.    Dispense:  90 capsule    Refill:  2    Fill one day early if pharmacy is closed on scheduled refill date. May substitute for generic if available.   Orders:  Orders Placed This Encounter  Procedures  . LUMBAR FACET(MEDIAL BRANCH NERVE BLOCK) MBNB    Standing Status:   Future    Standing Expiration Date:   07/24/2018    Scheduling Instructions:     Side: Bilateral     Level: L3-4, L4-5, & L5-S1 Facets (L2, L3, L4, L5, & S1 Medial Branch Nerves)     Sedation: Patient's choice.     Timeframe: ASAA    Order Specific Question:   Where will this procedure be performed?    Answer:   [facility]   Follow-up plan:   Return in about 3 months (around 09/24/2018) for (VV), E/M (MM), in addition, Procedure (w/ sedation): (B) L-FCT Blk.    I discussed the assessment and treatment plan with the patient. The patient was provided an opportunity to ask questions and all were answered. The patient agreed with the plan and demonstrated an understanding of the instructions.  Patient advised to call back or seek an in-person evaluation if the symptoms or condition worsens.  Total duration of non-face-to-face encounter: 27 minutes.  Note by:  Gaspar Cola, MD Date: 06/24/2018; Time: 2:59 PM  Note: This dictation was prepared with Dragon dictation. Any transcriptional errors that may result from this process are unintentional.  Disclaimer:  * Given the special circumstances of the COVID-19 pandemic, the federal government has announced that the Office for Civil Rights (OCR) will exercise its enforcement discretion and will not impose penalties on physicians using telehealth in the event of noncompliance with regulatory requirements under the [place] (HIPAA) in connection with the good faith provision of telehealth during the ZOXWR-60 national public health emergency. ([place])

## 2018-06-24 ENCOUNTER — Ambulatory Visit: Payer: Medicare Other | Attending: Pain Medicine | Admitting: Pain Medicine

## 2018-06-24 ENCOUNTER — Other Ambulatory Visit: Payer: Self-pay

## 2018-06-24 DIAGNOSIS — G894 Chronic pain syndrome: Secondary | ICD-10-CM | POA: Diagnosis not present

## 2018-06-24 DIAGNOSIS — M5442 Lumbago with sciatica, left side: Secondary | ICD-10-CM

## 2018-06-24 DIAGNOSIS — M5441 Lumbago with sciatica, right side: Secondary | ICD-10-CM

## 2018-06-24 DIAGNOSIS — M25562 Pain in left knee: Secondary | ICD-10-CM

## 2018-06-24 DIAGNOSIS — G8929 Other chronic pain: Secondary | ICD-10-CM | POA: Diagnosis not present

## 2018-06-24 DIAGNOSIS — M7918 Myalgia, other site: Secondary | ICD-10-CM

## 2018-06-24 DIAGNOSIS — M792 Neuralgia and neuritis, unspecified: Secondary | ICD-10-CM

## 2018-06-24 DIAGNOSIS — M549 Dorsalgia, unspecified: Secondary | ICD-10-CM

## 2018-06-24 DIAGNOSIS — M47816 Spondylosis without myelopathy or radiculopathy, lumbar region: Secondary | ICD-10-CM | POA: Diagnosis not present

## 2018-06-24 MED ORDER — PREGABALIN 100 MG PO CAPS: 100.0000 mg | ORAL_CAPSULE | Freq: Three times a day (TID) | ORAL | 2 refills | Status: DC

## 2018-06-24 MED ORDER — METHOCARBAMOL 750 MG PO TABS: 750.0000 mg | ORAL_TABLET | Freq: Three times a day (TID) | ORAL | 0 refills | Status: DC | PRN

## 2018-06-24 NOTE — Patient Instructions (Signed)

## 2018-06-26 ENCOUNTER — Ambulatory Visit: Payer: Self-pay | Admitting: Neurology

## 2018-06-29 ENCOUNTER — Other Ambulatory Visit: Payer: Self-pay

## 2018-06-29 ENCOUNTER — Telehealth: Payer: Self-pay | Admitting: Internal Medicine

## 2018-06-29 ENCOUNTER — Telehealth: Payer: Self-pay | Admitting: Neurology

## 2018-06-29 DIAGNOSIS — G35 Multiple sclerosis: Secondary | ICD-10-CM

## 2018-06-29 NOTE — Telephone Encounter (Signed)
Pt called no answer voice mail left for her to call back

## 2018-06-29 NOTE — Telephone Encounter (Signed)
Patient is requesting her Marinol RX be increased to 3 times a day. Please advise [phone]

## 2018-06-29 NOTE — Telephone Encounter (Signed)
Pt called to inform that her MRI was scheduled a day after her appt with Dr. Delice Lesch on 7/13, she wants to know if this is ok. Pls call her.

## 2018-06-29 NOTE — Telephone Encounter (Signed)
Patient was told to call all her doctors and let them know that she is not under the care of pain management anymore, and if pain medication needs to be prescribed to her it will be ok.  I told her that was not something we usually prescribe to patients but I would relay the message

## 2018-06-29 NOTE — Telephone Encounter (Signed)
Ok for MRI under sedation, pls have her let us know when they give her a date, I will need to see her for a visit before the MRI (can be e-visit). Thanks

## 2018-06-29 NOTE — Telephone Encounter (Signed)
Spoke with pt gave her the number to central scheduling [phone]  to get her MRI scheduled, pt informed once MRI is scheduled to call the office to set up an appointment with Dr. Delice Lesch before she has her MRI. Pt verbalized understand. Stated she would call office back, front desk was informed that pt would be calling to make an appointment

## 2018-06-29 NOTE — Telephone Encounter (Signed)
Pt returned call and wasn't sure if our office sent her to another doctor where she was receiving care. Pt said she would give our office a call back when she figured out who the practice was she just saw. Pt said she was making rounds to call her doctor and she called our office.

## 2018-06-29 NOTE — Telephone Encounter (Signed)
Pt called and informed that her MRI appointment works with her appointment with [name], pt stated that she has fallen, she went to the ER last week due to 2 seizures triggers increase stress, fussing with family, pt had her Hemorid surgery has her follow up next week. She also stated that she has been staring off but when she comes out of it she knows that is going on,  psych MD increase diazepam. Pt stated that she does not have a contract with pain management any Dr can give her pain medication.

## 2018-06-29 NOTE — Telephone Encounter (Signed)
Patient is requesting her Marinol RX be increased to 3 times a day. Please advise [phone]

## 2018-06-29 NOTE — Telephone Encounter (Signed)
Lmom, waiting on a return call.  

## 2018-06-29 NOTE — Telephone Encounter (Signed)
Spoke with Dr. Delice Lesch and per Dr. Delice Lesch  ask her if she can do the MRI brain without general anesthesia, like she did in May 2019 when she did it without sedation? It will be shorter than the spinal MRIs and she can take an additional valium if needed. I would like to minimize sedation if able.    Pt stated that she was told that she needed to ask for sedation when she has an MRI after she had a seizure when she had her spinal MRI.

## 2018-06-29 NOTE — Telephone Encounter (Signed)
Patient needs to talk to someone about getting a MRI done  at the hospital she states that she can not do it at [facility]

## 2018-06-30 DIAGNOSIS — I10 Essential (primary) hypertension: Secondary | ICD-10-CM | POA: Diagnosis not present

## 2018-06-30 DIAGNOSIS — Z299 Encounter for prophylactic measures, unspecified: Secondary | ICD-10-CM | POA: Diagnosis not present

## 2018-06-30 DIAGNOSIS — R350 Frequency of micturition: Secondary | ICD-10-CM | POA: Diagnosis not present

## 2018-06-30 DIAGNOSIS — N926 Irregular menstruation, unspecified: Secondary | ICD-10-CM | POA: Diagnosis not present

## 2018-06-30 DIAGNOSIS — Z6841 Body Mass Index (BMI) 40.0 and over, adult: Secondary | ICD-10-CM | POA: Diagnosis not present

## 2018-06-30 DIAGNOSIS — N200 Calculus of kidney: Secondary | ICD-10-CM | POA: Diagnosis not present

## 2018-06-30 NOTE — Telephone Encounter (Signed)
Noted. Our office does not prescribe pain meds, if she needs pain meds, would still need to talk to her Pain Mgt doc. Thanks

## 2018-07-03 NOTE — Telephone Encounter (Signed)
Route to AB who last saw patient 04/2018 and started Marinol

## 2018-07-06 ENCOUNTER — Other Ambulatory Visit: Payer: Self-pay

## 2018-07-06 ENCOUNTER — Other Ambulatory Visit: Payer: Self-pay | Admitting: Gastroenterology

## 2018-07-06 ENCOUNTER — Other Ambulatory Visit
Admission: RE | Admit: 2018-07-06 | Discharge: 2018-07-06 | Disposition: A | Discharge status: Home / Self Care | Payer: Medicare Other | Source: Ambulatory Visit | Attending: Pain Medicine | Admitting: Pain Medicine

## 2018-07-06 DIAGNOSIS — Z1159 Encounter for screening for other viral diseases: Secondary | ICD-10-CM | POA: Insufficient documentation

## 2018-07-06 DIAGNOSIS — Z01812 Encounter for preprocedural laboratory examination: Secondary | ICD-10-CM | POA: Diagnosis not present

## 2018-07-06 MED ORDER — DRONABINOL 2.5 MG PO CAPS: 2.5000 mg | ORAL_CAPSULE | Freq: Three times a day (TID) | ORAL | 3 refills | Status: DC

## 2018-07-06 NOTE — Telephone Encounter (Signed)
Pt notified and AB is sending over a new RX for pt.

## 2018-07-06 NOTE — Telephone Encounter (Signed)
How is nausea? What is current dosing of Marinol?

## 2018-07-06 NOTE — Telephone Encounter (Signed)
She can do before meals, only 3 times per day.

## 2018-07-06 NOTE — Telephone Encounter (Signed)
Pt's nausea is controlled when Marinol is in her system. When it wears off, the nausea returns. Pt currently takes Marinol 2.5 mg twice daily and thought that with her Breakfast, Lunch and [employer]

## 2018-07-07 LAB — NOVEL CORONAVIRUS, NAA (HOSP ORDER, SEND-OUT TO REF LAB; TAT 18-24 HRS): SARS-CoV-2, NAA: NOT DETECTED

## 2018-07-09 ENCOUNTER — Ambulatory Visit (HOSPITAL_BASED_OUTPATIENT_CLINIC_OR_DEPARTMENT_OTHER): Payer: Medicare Other | Admitting: Pain Medicine

## 2018-07-09 ENCOUNTER — Ambulatory Visit
Admission: RE | Admit: 2018-07-09 | Discharge: 2018-07-09 | Disposition: A | Discharge status: Home / Self Care | Payer: Medicare Other | Source: Ambulatory Visit | Attending: Pain Medicine | Admitting: Pain Medicine

## 2018-07-09 ENCOUNTER — Other Ambulatory Visit: Payer: Self-pay

## 2018-07-09 ENCOUNTER — Encounter: Payer: Self-pay | Admitting: Pain Medicine

## 2018-07-09 VITALS — BP 130/80 | HR 73 | Temp 98.2°F | Resp 16 | Ht 64.0 in | Wt 268.0 lb

## 2018-07-09 DIAGNOSIS — G8929 Other chronic pain: Secondary | ICD-10-CM | POA: Insufficient documentation

## 2018-07-09 DIAGNOSIS — M5441 Lumbago with sciatica, right side: Secondary | ICD-10-CM | POA: Insufficient documentation

## 2018-07-09 DIAGNOSIS — M47817 Spondylosis without myelopathy or radiculopathy, lumbosacral region: Secondary | ICD-10-CM | POA: Insufficient documentation

## 2018-07-09 DIAGNOSIS — M5136 Other intervertebral disc degeneration, lumbar region: Secondary | ICD-10-CM | POA: Diagnosis not present

## 2018-07-09 DIAGNOSIS — M47816 Spondylosis without myelopathy or radiculopathy, lumbar region: Secondary | ICD-10-CM | POA: Diagnosis not present

## 2018-07-09 DIAGNOSIS — M5442 Lumbago with sciatica, left side: Secondary | ICD-10-CM | POA: Diagnosis not present

## 2018-07-09 MED ORDER — GLYCOPYRROLATE 0.2 MG/ML IJ SOLN
0.2000 mg | Freq: Once | INTRAMUSCULAR | Status: AC
  Administered 2018-07-09: 0.2 mg via INTRAVENOUS
  Filled 2018-07-09: qty 1

## 2018-07-09 MED ORDER — LACTATED RINGERS IV SOLN
1000.0000 mL | Freq: Once | INTRAVENOUS | Status: AC
  Administered 2018-07-09: 1000 mL via INTRAVENOUS

## 2018-07-09 MED ORDER — TRIAMCINOLONE ACETONIDE 40 MG/ML IJ SUSP
80.0000 mg | Freq: Once | INTRAMUSCULAR | Status: AC
  Administered 2018-07-09: 80 mg
  Filled 2018-07-09: qty 2

## 2018-07-09 MED ORDER — LIDOCAINE HCL 2 % IJ SOLN
20.0000 mL | Freq: Once | INTRAMUSCULAR | Status: AC
  Administered 2018-07-09: 400 mg
  Filled 2018-07-09: qty 20

## 2018-07-09 MED ORDER — ROPIVACAINE HCL 2 MG/ML IJ SOLN
18.0000 mL | Freq: Once | INTRAMUSCULAR | Status: AC
  Administered 2018-07-09: 18 mL via PERINEURAL
  Filled 2018-07-09: qty 20

## 2018-07-09 MED ORDER — MIDAZOLAM HCL 5 MG/5ML IJ SOLN
1.0000 mg | INTRAMUSCULAR | Status: DC | PRN
  Administered 2018-07-09: 2 mg via INTRAVENOUS
  Filled 2018-07-09: qty 5

## 2018-07-09 MED ORDER — DIPHENHYDRAMINE HCL 50 MG/ML IJ SOLN
25.0000 mg | INTRAMUSCULAR | Status: DC | PRN
  Administered 2018-07-09: 25 mg via INTRAVENOUS
  Filled 2018-07-09: qty 1

## 2018-07-09 MED ORDER — FENTANYL CITRATE (PF) 100 MCG/2ML IJ SOLN
25.0000 ug | INTRAMUSCULAR | Status: DC | PRN
  Administered 2018-07-09: 50 ug via INTRAVENOUS
  Filled 2018-07-09: qty 2

## 2018-07-09 NOTE — Progress Notes (Signed)
Patient's Name: Emily [name]  MRN: [id number]  Referring Provider: Monico Blitz, MD  DOB: 21-Jan-1980  PCP: Monico Blitz, MD  DOS: 07/09/2018  Note by: Gaspar Cola, MD  Service setting: Ambulatory outpatient  Specialty: Interventional Pain Management  Patient type: Established  Location: [facility] (AMB) Pain Management Facility  Visit type: Interventional Procedure   Primary Reason for Visit: Interventional Pain Management Treatment. CC: Back Pain (lumbar bilateral )  Procedure:          Anesthesia, Analgesia, Anxiolysis:  Type: Lumbar Facet, Medial Branch Block(s) #2  Primary Purpose: Diagnostic Region: Posterolateral Lumbosacral Spine Level: L2, L3, L4, L5, & S1 Medial Branch Level(s). Injecting these levels blocks the L3-4, L4-5, and L5-S1 lumbar facet joints. Laterality: Bilateral  Type: Moderate (Conscious) Sedation combined with Local Anesthesia Indication(s): Analgesia and Anxiety Route: Intravenous (IV) IV Access: Secured Sedation: Meaningful verbal contact was maintained at all times during the procedure  Local Anesthetic: Lidocaine 1-2%  Position: Prone   Indications: 1. Lumbar facet syndrome (Bilateral) (R>L)   2. Spondylosis without myelopathy or radiculopathy, lumbosacral region   3. DDD (degenerative disc disease), lumbar   4. Lumbar facet arthropathy   5. Chronic low back pain (Secondary Area of Pain) (Bilateral) (L>R)    Pain Score: Pre-procedure: 10-Worst pain ever/10 Post-procedure: 0-No pain/10  Pre-op Assessment:  Emily [name] is a 38 y.o. (year old), female patient, seen today for interventional treatment. 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 Emily [name] has a current medication list which includes the following prescription(s): albuterol, amlodipine, buspirone, diazepam, divalproex, dronabinol, epinephrine, erenumab-aooe, etonogestrel, fingolimod hcl, fluoxetine, lithium carbonate, loratadine, methocarbamol, multiple vitamins-minerals, NON FORMULARY, omeprazole, pantoprazole, pregabalin, and topiramate, and the following Facility-Administered Medications: diphenhydramine, fentanyl, metoclopramide, and midazolam. Her primarily concern today is the Back Pain (lumbar bilateral )  Initial Vital Signs:  Pulse/HCG Rate: 79ECG Heart Rate: 76 Temp: 98.4 F (36.9 C) Resp: 16 BP: (!) 143/98 SpO2: 100 %  BMI: Estimated body mass index is 46 kg/m as calculated from the following:   Height as of this encounter: 5\' 4"  (1.626 m).   Weight as of this encounter: 268 lb (121.6 kg).  Risk Assessment: Allergies: Reviewed. She is allergic to amitriptyline; baclofen; duloxetine hcl; gabapentin; hydrocodone-acetaminophen; magnesium salicylate; modafinil; monosodium glutamate; other; tizanidine; alprazolam; rizatriptan; vicodin [hydrocodone-acetaminophen]; adhesive [tape]; ketorolac tromethamine; lamotrigine; and tramadol.  Allergy Precautions: None required Coagulopathies: Reviewed. None identified.  Blood-thinner therapy: None at this time Active Infection(s): Reviewed. None identified. Emily [name] is afebrile  Site Confirmation: Emily [name] was asked to confirm the procedure and laterality before marking the site Procedure checklist: Completed Consent: Before the procedure and under the influence of no sedative(s), amnesic(s), or anxiolytics, the patient was informed of the treatment options, risks and possible complications. To fulfill our ethical and legal obligations, as recommended by the American Medical  Association's Code of Ethics, I have informed the patient of my clinical impression; the nature and purpose of the treatment or procedure; the risks, benefits, and possible complications of the intervention; the alternatives, including doing nothing; the risk(s) and benefit(s) of the alternative treatment(s) or procedure(s); and the risk(s) and benefit(s) of doing nothing. The patient was  provided information about the general risks and possible complications associated with the procedure. These may include, but are not limited to: failure to achieve desired goals, infection, bleeding, organ or nerve damage, allergic reactions, paralysis, and death. In addition, the patient was informed of those risks and complications associated to Spine-related procedures, such as failure to decrease pain; infection (i.e.: Meningitis, epidural or intraspinal abscess); bleeding (i.e.: epidural hematoma, subarachnoid hemorrhage, or any other type of intraspinal or peri-dural bleeding); organ or nerve damage (i.e.: Any type of peripheral nerve, nerve root, or spinal cord injury) with subsequent damage to sensory, motor, and/or autonomic systems, resulting in permanent pain, numbness, and/or weakness of one or several areas of the body; allergic reactions; (i.e.: anaphylactic reaction); and/or death. Furthermore, the patient was informed of those risks and complications associated with the medications. These include, but are not limited to: allergic reactions (i.e.: anaphylactic or anaphylactoid reaction(s)); adrenal axis suppression; blood sugar elevation that in diabetics may result in ketoacidosis or comma; water retention that in patients with history of congestive heart failure may result in shortness of breath, pulmonary edema, and decompensation with resultant heart failure; weight gain; swelling or edema; medication-induced neural toxicity; particulate matter embolism and blood vessel occlusion with resultant organ, and/or  nervous system infarction; and/or aseptic necrosis of one or more joints. Finally, the patient was informed that Medicine is not an exact science; therefore, there is also the possibility of unforeseen or unpredictable risks and/or possible complications that may result in a catastrophic outcome. The patient indicated having understood very clearly. We have given the patient no guarantees and we have made no promises. Enough time was given to the patient to ask questions, all of which were answered to the patient's satisfaction. Emily [name] has indicated that she wanted to continue with the procedure. Attestation: I, the ordering provider, attest that I have discussed with the patient the benefits, risks, side-effects, alternatives, likelihood of achieving goals, and potential problems during recovery for the procedure that I have provided informed consent. Date   Time: 07/09/2018  9:18 AM  Pre-Procedure Preparation:  Monitoring: As per clinic protocol. Respiration, ETCO2, SpO2, BP, heart rate and rhythm monitor placed and checked for adequate function Safety Precautions: Patient was assessed for positional comfort and pressure points before starting the procedure. Time-out: I initiated and conducted the "Time-out" before starting the procedure, as per protocol. The patient was asked to participate by confirming the accuracy of the "Time Out" information. Verification of the correct person, site, and procedure were performed and confirmed by me, the nursing staff, and the patient. "Time-out" conducted as per Joint Commission's Universal Protocol (UP.01.01.01). Time: 1005  Description of Procedure:          Laterality: Bilateral. The procedure was performed in identical fashion on both sides. Levels:  L2, L3, L4, L5, & S1 Medial Branch Level(s) Area Prepped: Posterior Lumbosacral Region Prepping solution: DuraPrep (Iodine Povacrylex [0.7% available iodine] and Isopropyl Alcohol, 74% w/w) Safety  Precautions: Aspiration looking for blood return was conducted prior to all injections. At no point did we inject any substances, as a needle was being advanced. Before injecting, the patient was told to immediately notify me if she was experiencing any new onset of "ringing in the ears, or metallic taste in the mouth". No attempts were made at seeking any paresthesias. Safe injection practices and needle disposal techniques used. Medications properly checked for expiration dates. SDV (single dose vial) medications used. After the completion of the procedure, all disposable equipment  used was discarded in the proper designated medical waste containers. Local Anesthesia: Protocol guidelines were followed. The patient was positioned over the fluoroscopy table. The area was prepped in the usual manner. The time-out was completed. The target area was identified using fluoroscopy. A 12-in long, straight, sterile hemostat was used with fluoroscopic guidance to locate the targets for each level blocked. Once located, the skin was marked with an approved surgical skin marker. Once all sites were marked, the skin (epidermis, dermis, and hypodermis), as well as deeper tissues (fat, connective tissue and muscle) were infiltrated with a small amount of a short-acting local anesthetic, loaded on a 10cc syringe with a 25G, 1.5-in  Needle. An appropriate amount of time was allowed for local anesthetics to take effect before proceeding to the next step. Local Anesthetic: Lidocaine 2.0% The unused portion of the local anesthetic was discarded in the proper designated containers. Technical explanation of process:  L2 Medial Branch Nerve Block (MBB): The target area for the L2 medial branch is at the junction of the postero-lateral aspect of the superior articular process and the superior, posterior, and medial edge of the transverse process of L3. Under fluoroscopic guidance, a Quincke needle was inserted until contact was made  with os over the superior postero-lateral aspect of the pedicular shadow (target area). After negative aspiration for blood, 0.5 mL of the nerve block solution was injected without difficulty or complication. The needle was removed intact. L3 Medial Branch Nerve Block (MBB): The target area for the L3 medial branch is at the junction of the postero-lateral aspect of the superior articular process and the superior, posterior, and medial edge of the transverse process of L4. Under fluoroscopic guidance, a Quincke needle was inserted until contact was made with os over the superior postero-lateral aspect of the pedicular shadow (target area). After negative aspiration for blood, 0.5 mL of the nerve block solution was injected without difficulty or complication. The needle was removed intact. L4 Medial Branch Nerve Block (MBB): The target area for the L4 medial branch is at the junction of the postero-lateral aspect of the superior articular process and the superior, posterior, and medial edge of the transverse process of L5. Under fluoroscopic guidance, a Quincke needle was inserted until contact was made with os over the superior postero-lateral aspect of the pedicular shadow (target area). After negative aspiration for blood, 0.5 mL of the nerve block solution was injected without difficulty or complication. The needle was removed intact. L5 Medial Branch Nerve Block (MBB): The target area for the L5 medial branch is at the junction of the postero-lateral aspect of the superior articular process and the superior, posterior, and medial edge of the sacral ala. Under fluoroscopic guidance, a Quincke needle was inserted until contact was made with os over the superior postero-lateral aspect of the pedicular shadow (target area). After negative aspiration for blood, 0.5 mL of the nerve block solution was injected without difficulty or complication. The needle was removed intact. S1 Medial Branch Nerve Block (MBB): The  target area for the S1 medial branch is at the posterior and inferior 6 o'clock position of the L5-S1 facet joint. Under fluoroscopic guidance, the Quincke needle inserted for the L5 MBB was redirected until contact was made with os over the inferior and postero aspect of the sacrum, at the 6 o' clock position under the L5-S1 facet joint (Target area). After negative aspiration for blood, 0.5 mL of the nerve block solution was injected without difficulty or complication. The needle  was removed intact.  Nerve block solution: 0.2% PF-Ropivacaine + Triamcinolone (40 mg/mL) diluted to a final concentration of 4 mg of Triamcinolone/mL of Ropivacaine The unused portion of the solution was discarded in the proper designated containers. Procedural Needles: 22-gauge, 3.5-inch, Quincke needles used for all levels.  Once the entire procedure was completed, the treated area was cleaned, making sure to leave some of the prepping solution back to take advantage of its long term bactericidal properties.   Illustration of the posterior view of the lumbar spine and the posterior neural structures. Laminae of L2 through S1 are labeled. DPRL5, dorsal primary ramus of L5; DPRS1, dorsal primary ramus of S1; DPR3, dorsal primary ramus of L3; FJ, facet (zygapophyseal) joint L3-L4; I, inferior articular process of L4; LB1, lateral branch of dorsal primary ramus of L1; IAB, inferior articular branches from L3 medial branch (supplies L4-L5 facet joint); IBP, intermediate branch plexus; MB3, medial branch of dorsal primary ramus of L3; NR3, third lumbar nerve root; S, superior articular process of L5; SAB, superior articular branches from L4 (supplies L4-5 facet joint also); TP3, transverse process of L3.  Vitals:   07/09/18 1015 07/09/18 1025 07/09/18 1035 07/09/18 1045  BP: (!) 133/97 135/85 138/89 130/80  Pulse: 73     Resp: 16 16 15 16   Temp:  98.2 F (36.8 C)  98.2 F (36.8 C)  TempSrc:      SpO2: 97% 98% 98% 98%    Weight:      Height:         Start Time: 1005 hrs. End Time: 1015 hrs.  Imaging Guidance (Spinal):          Type of Imaging Technique: Fluoroscopy Guidance (Spinal) Indication(s): Assistance in needle guidance and placement for procedures requiring needle placement in or near specific anatomical locations not easily accessible without such assistance. Exposure Time: Please see nurses notes. Contrast: None used. Fluoroscopic Guidance: I was personally present during the use of fluoroscopy. "Tunnel Vision Technique" used to obtain the best possible view of the target area. Parallax error corrected before commencing the procedure. "Direction-depth-direction" technique used to introduce the needle under continuous pulsed fluoroscopy. Once target was reached, antero-posterior, oblique, and lateral fluoroscopic projection used confirm needle placement in all planes. Images permanently stored in EMR. Interpretation: No contrast injected. I personally interpreted the imaging intraoperatively. Adequate needle placement confirmed in multiple planes. Permanent images saved into the patient's record.  Antibiotic Prophylaxis:   Anti-infectives (From admission, onward)   None     Indication(s): None identified  Post-operative Assessment:  Post-procedure Vital Signs:  Pulse/HCG Rate: 7378 Temp: 98.2 F (36.8 C) Resp: 16 BP: 130/80 SpO2: 98 %  EBL: None  Complications: No immediate post-treatment complications observed by team, or reported by patient.  Note: The patient tolerated the entire procedure well. A repeat set of vitals were taken after the procedure and the patient was kept under observation following institutional policy, for this type of procedure. Post-procedural neurological assessment was performed, showing return to baseline, prior to discharge. The patient was provided with post-procedure discharge instructions, including a section on how to identify potential problems. Should  any problems arise concerning this procedure, the patient was given instructions to immediately contact us, at any time, without hesitation. In any case, we plan to contact the patient by telephone for a follow-up status report regarding this interventional procedure.  Comments:  No additional relevant information.  Plan of Care  Orders:  Orders Placed This Encounter  Procedures  LUMBAR FACET(MEDIAL BRANCH NERVE BLOCK) MBNB    Scheduling Instructions:     Side: Bilateral     Level: L3-4, L4-5, & L5-S1 Facets (L2, L3, L4, L5, & S1 Medial Branch Nerves)     Sedation: With Sedation.     Timeframe: Today    Order Specific Question:   Where will this procedure be performed?    Answer:   [facility]   Novel Coronavirus, NAA (hospital order; send-out to ref lab)    No isolation needed for this testing (if isolation ordered for another indication, maintain current isolation).    Standing Status:   Standing    Number of Occurrences:   1    Order Specific Question:   Pre-procedural testing    Answer:   Yes   [facility] C-ARM 1-60 MIN NO REPORT    Intraoperative interpretation by procedural physician at [place].    Standing Status:   Standing    Number of Occurrences:   1    Order Specific Question:   Reason for exam:    Answer:   Assistance in needle guidance and placement for procedures requiring needle placement in or near specific anatomical locations not easily accessible without such assistance.   Provider attestation of informed consent for procedure/surgical case    I, the ordering provider, attest that I have discussed with the patient the benefits, risks, side effects, alternatives, likelihood of achieving goals and potential problems during recovery for the procedure that I have provided informed consent.    Standing Status:   Standing    Number of Occurrences:   1   Informed Consent Details: Transcribe to consent form and obtain patient signature     Standing Status:   Standing    Number of Occurrences:   1    Order Specific Question:   Procedure    Answer:   Bilateral Lumbar facet block (medial branch block) under fluoroscopic guidance. (See notes for levels.)    Order Specific Question:   Surgeon    Answer:   Giannamarie Paulus A. Dossie Arbour, MD    Order Specific Question:   Indication/Reason    Answer:   Bilateral low back pain with or without lower extremity pain   Chronic Opioid Analgesic:  None from our practice.  Medications ordered for procedure: Meds ordered this encounter  Medications   lidocaine (XYLOCAINE) 2 % (with pres) injection 400 mg   lactated ringers infusion 1,000 mL   diphenhydrAMINE (BENADRYL) injection 25 mg   glycopyrrolate (ROBINUL) injection 0.2 mg   midazolam (VERSED) 5 MG/5ML injection 1-2 mg    Make sure Flumazenil is available in the pyxis when using this medication. If oversedation occurs, administer 0.2 mg IV over 15 sec. If after 45 sec no response, administer 0.2 mg again over 1 min; may repeat at 1 min intervals; not to exceed 4 doses (1 mg)   fentaNYL (SUBLIMAZE) injection 25-50 mcg    Make sure Narcan is available in the pyxis when using this medication. In the event of respiratory depression (RR< 8/min): Titrate NARCAN (naloxone) in increments of 0.1 to 0.2 mg IV at 2-3 minute intervals, until desired degree of reversal.   ropivacaine (PF) 2 mg/mL (0.2%) (NAROPIN) injection 18 mL   triamcinolone acetonide (KENALOG-40) injection 80 mg   Medications administered: We administered lidocaine, lactated ringers, diphenhydrAMINE, glycopyrrolate, midazolam, fentaNYL, ropivacaine (PF) 2 mg/mL (0.2%), and triamcinolone acetonide.  See the medical record for exact dosing, route, and time of administration.  Disposition: Discharge home  Discharge Date & Time: 07/09/2018; 1046 hrs.   Follow-up plan:   Return in about 2 weeks (around 07/23/2018) for (VV), E/M (PP).       Interventional management  options: Considering:   Possiblebilateral thoracic epidural steroid injection Possiblebilateral thoracic facet injections Possiblebilateral thoracic radiofrequency ablation Possible right sided lumbar epidural steroid injection Possibleright-sided lumbar facet injection Possibleright-sided lumbar radiofrequency ablation Possibleleft intra-articular knee injection Possibleleft knee genicular nerve block Possibleleft knee radiofrequency ablation  Possibleright-sided cervical epidural steroid injection possibleright-sided cervical facet block Possibleright-sided cervical radiofrequency ablation Possibleright occipital nerve block   Palliative PRN treatment(s):   None at this time    Recent Visits Date Type Provider Dept  06/24/18 Office Visit Milinda Pointer, MD [facility]  04/15/18 Office Visit Milinda Pointer, MD [facility]  Showing recent visits within past 90 days and meeting all other requirements   Today's Visits Date Type Provider Dept  07/09/18 Procedure visit Milinda Pointer, MD [facility]  Showing today's visits and meeting all other requirements   Future Appointments Date Type Provider Dept  08/04/18 Appointment Milinda Pointer, MD [facility]  09/16/18 Appointment Milinda Pointer, MD [facility]  Showing future appointments within next 90 days and meeting all other requirements   Primary Care Physician: Monico Blitz, MD Location: [facility] Note by: Gaspar Cola, MD Date: 07/09/2018; Time: 12:04 PM  Disclaimer:  Medicine is not an exact science. The only guarantee in medicine is that nothing is guaranteed. It is important to note that the decision to proceed with this intervention was based on the information collected from the patient. The Data and conclusions were drawn from the patient's questionnaire, the interview, and the physical  examination. Because the information was provided in large part by the patient, it cannot be guaranteed that it has not been purposely or unconsciously manipulated. Every effort has been made to obtain as much relevant data as possible for this evaluation. It is important to note that the conclusions that lead to this procedure are derived in large part from the available data. Always take into account that the treatment will also be dependent on availability of resources and existing treatment guidelines, considered by other Pain Management Practitioners as being common knowledge and practice, at the time of the intervention. For Medico-Legal purposes, it is also important to point out that variation in procedural techniques and pharmacological choices are the acceptable norm. The indications, contraindications, technique, and results of the above procedure should only be interpreted and judged by a Board-Certified Interventional Pain Specialist with extensive familiarity and expertise in the same exact procedure and technique.

## 2018-07-09 NOTE — Patient Instructions (Signed)

## 2018-07-10 ENCOUNTER — Telehealth: Payer: Self-pay

## 2018-07-10 NOTE — Telephone Encounter (Signed)
Post procedure phone call.  LM 

## 2018-07-13 DIAGNOSIS — K648 Other hemorrhoids: Secondary | ICD-10-CM | POA: Diagnosis not present

## 2018-07-15 DIAGNOSIS — I10 Essential (primary) hypertension: Secondary | ICD-10-CM | POA: Diagnosis not present

## 2018-07-15 DIAGNOSIS — F419 Anxiety disorder, unspecified: Secondary | ICD-10-CM | POA: Diagnosis present

## 2018-07-15 DIAGNOSIS — G35 Multiple sclerosis: Secondary | ICD-10-CM | POA: Diagnosis not present

## 2018-07-15 DIAGNOSIS — Z6841 Body Mass Index (BMI) 40.0 and over, adult: Secondary | ICD-10-CM | POA: Diagnosis not present

## 2018-07-15 DIAGNOSIS — Z87891 Personal history of nicotine dependence: Secondary | ICD-10-CM | POA: Diagnosis not present

## 2018-07-15 DIAGNOSIS — E669 Obesity, unspecified: Secondary | ICD-10-CM | POA: Diagnosis present

## 2018-07-15 DIAGNOSIS — M797 Fibromyalgia: Secondary | ICD-10-CM | POA: Diagnosis present

## 2018-07-15 DIAGNOSIS — K219 Gastro-esophageal reflux disease without esophagitis: Secondary | ICD-10-CM | POA: Diagnosis present

## 2018-07-15 DIAGNOSIS — G43909 Migraine, unspecified, not intractable, without status migrainosus: Secondary | ICD-10-CM | POA: Diagnosis present

## 2018-07-15 DIAGNOSIS — N200 Calculus of kidney: Secondary | ICD-10-CM | POA: Diagnosis not present

## 2018-07-15 DIAGNOSIS — L02211 Cutaneous abscess of abdominal wall: Secondary | ICD-10-CM | POA: Diagnosis not present

## 2018-07-15 DIAGNOSIS — F329 Major depressive disorder, single episode, unspecified: Secondary | ICD-10-CM | POA: Diagnosis present

## 2018-07-15 DIAGNOSIS — K760 Fatty (change of) liver, not elsewhere classified: Secondary | ICD-10-CM | POA: Diagnosis not present

## 2018-07-16 ENCOUNTER — Ambulatory Visit (HOSPITAL_COMMUNITY): Payer: Medicare Other

## 2018-07-16 ENCOUNTER — Telehealth: Payer: Self-pay | Admitting: Neurology

## 2018-07-16 NOTE — Progress Notes (Addendum)
Called and spoke with Mrs Buendia today to inquire her arrival for Covid 19 screening today. She stated she is currently admitted at a hospital in [place] and doesn't know how long she will be there. She said her mother has called to alert her doctor. I called Dr Aquino's office and spoke to [place]. He said he would let [name] know of the patients current status.

## 2018-07-16 NOTE — Telephone Encounter (Signed)
New Message  Ginger a nurse at the COVID-19 testing site verbalized the patient has been admitted at a hospital in [place], [place] and unsure when she will be discharged.  Ginger advised she was admitted prior to patient's CoV testing.  Ginger also advised patient has MRI scheduled on the 14th and wanted to make the MD aware.

## 2018-07-17 NOTE — Telephone Encounter (Signed)
Left message for pt to return call.  Informed that we may need to change appt to a virtual visit if Covid results are not back.

## 2018-07-17 NOTE — Telephone Encounter (Signed)
She has an appointment on Monday, if Covid test is not yet back, will have to be a virtual visit. We will have to just check Monday morning, unless she has already been discharged home today and we can request records from hospital. Thanks

## 2018-07-19 ENCOUNTER — Other Ambulatory Visit: Payer: Self-pay | Admitting: Pain Medicine

## 2018-07-19 DIAGNOSIS — Z87898 Personal history of other specified conditions: Secondary | ICD-10-CM | POA: Insufficient documentation

## 2018-07-19 DIAGNOSIS — T8189XA Other complications of procedures, not elsewhere classified, initial encounter: Secondary | ICD-10-CM | POA: Diagnosis not present

## 2018-07-19 DIAGNOSIS — L02211 Cutaneous abscess of abdominal wall: Secondary | ICD-10-CM | POA: Diagnosis not present

## 2018-07-19 DIAGNOSIS — I10 Essential (primary) hypertension: Secondary | ICD-10-CM | POA: Diagnosis not present

## 2018-07-19 NOTE — Progress Notes (Signed)
Weekend Telephone Call Note:  Patient called me today 07/19/2018 at 10:40 AM to ask me to call hep PCP, Dr. Monico Blitz, to clarify with him our acute pain management policy, since she claimed to have had surgery at a [facility] facility, this past week 07/6-10/2018, and not having received pain medication for only two (2) days and currently having a lot of pain from the surgery where she had an infection and had to pack the wound daily. She also claimed that she had just been released from the hospital by Dr. Charm Rings, her PCP, but that he did not want to give her any pain medication after the surgery because she was been seen at our practice and she had an opioid contract with [place]. She claimed to have told him that she was not receiving any pain medication from [place], only injections. According to her, despite her pleas, he told her to call us if she needed any pain medication. She voiced that I needed to call him to tell him about our acute pain management policy, where we do not write for any pain medications for acute post-op pain. She said that "someone has to write some pain medication" for her.   I was very clear with her that I would not be telling any physicians what to do. She understood and accepted. I did call Dr. Manuella Ghazi at [phone] and I left a message clarifying our acute post-op pain management policy. However, upon reviewing her medical records further, these were my findings:  Review of the chart revealed the most recent surgery to have been a hemorrhoidectomy on 06/11/18, at a [facility] facility. On 07/13/18, she was evaluated at a [facility] facility by Pollie Friar, FNP for anal pain. No infections were described. Furthermore, review of the PDMP revealed that the day of her surgery, 06/11/18, she did get a prescription filled from Ileana Roup, MD, for Oxycodone HCL 5 mg (#56) to last x 7 days. On 06/19/18 (8 days later), she had another prescription for Oxycodone HCL  5 mg (#15) to last her for 3 days, prescribed by Kellie Shropshire, PA.  On 06/30/18, she had another prescription written, this time by Louie Casa, NP, for Oxycodone/APAP 5/325 (#20) to last for another 5 days. Finally, on 07/18/18, the patient received yet another prescription, this time by Dr. Charm Rings, her PCP, for Oxycodone/APAP 5/325 (#6) to last for another 5 days.  Facts:  1. The patient lied to me about the time and location of her surgery. 2. She lied to me about the nature of her pain and how she was having an infection (symptom exaggeration). 3. She lied to me about not having received acute post-op opioid analgesics. 4. She lied to me about not having received any pain medications from Dr. Manuella Ghazi. 5. She omited having received pain medications from Dr. Manuella Ghazi, just yesterday.  Impression: She continue to be VERY High-Risk for Substance Use Disorder.   Plan & Recommendations: 1. Discontinue all opioids. 2. Consider using topical lidocaine gel for her anal pain. 3. The patient is correct in the fact that we only provide Chronic Pain Management. (Chronic pain defined as: any pain, either constant or intermittent, present longer than 3 months.)   F. Dossie Arbour, MD

## 2018-07-20 ENCOUNTER — Encounter

## 2018-07-20 ENCOUNTER — Telehealth: Payer: Self-pay | Admitting: Pain Medicine

## 2018-07-20 ENCOUNTER — Telehealth: Payer: Self-pay | Admitting: Neurology

## 2018-07-20 ENCOUNTER — Ambulatory Visit: Payer: Self-pay | Admitting: Neurology

## 2018-07-20 DIAGNOSIS — I10 Essential (primary) hypertension: Secondary | ICD-10-CM | POA: Diagnosis not present

## 2018-07-20 DIAGNOSIS — G8929 Other chronic pain: Secondary | ICD-10-CM | POA: Diagnosis not present

## 2018-07-20 DIAGNOSIS — L02211 Cutaneous abscess of abdominal wall: Secondary | ICD-10-CM | POA: Diagnosis not present

## 2018-07-20 DIAGNOSIS — Z6841 Body Mass Index (BMI) 40.0 and over, adult: Secondary | ICD-10-CM | POA: Diagnosis not present

## 2018-07-20 DIAGNOSIS — Z299 Encounter for prophylactic measures, unspecified: Secondary | ICD-10-CM | POA: Diagnosis not present

## 2018-07-20 NOTE — Telephone Encounter (Signed)
New Message  Patient verbalized she is needing pain medication due to her surgery from hospital and ED MD prescribed medication she was allergic to and she could not take and she is in pain.  Patient is wanting to speak to nurse for advice only to figure out what she needs to do.

## 2018-07-20 NOTE — Telephone Encounter (Signed)
Informed pt that Dr. Delice Lesch does not prescribe pain medications.  Pt states that she knew that. She just wants it documented in her chart that the ED Dr. Prescribed her Tramadol which she is allergic to and she informed him of this and she believes that it upset him. Overall, it sounded like he prescribed something different for her and she has a virtual visit follow up today.

## 2018-07-20 NOTE — Telephone Encounter (Signed)
Patient Emily [name] on Friday 07-17-18 at [place] stating she had to have a surgery on an absess and because she comes here for injections they would not give her any pain meds. She has open wound that has to be packed daily. Please contact patient and let her know what she needs to do to get some pain relief.

## 2018-07-20 NOTE — Telephone Encounter (Addendum)
Appt has been cancelled.  

## 2018-07-20 NOTE — Telephone Encounter (Signed)
Talked with patient. Informed her that if she needed additional medication she needed to get it  from the doctor who did the surgery. Will fax form to MD stating he is allowed to prescribed.  Patient with understanding. She staes that she is packing her abd wound.

## 2018-07-21 ENCOUNTER — Ambulatory Visit (HOSPITAL_COMMUNITY)
Admission: RE | Admit: 2018-07-21 | Discharge: 2018-07-21 | Disposition: A | Discharge status: Home / Self Care | Payer: Medicare Other | Source: Ambulatory Visit | Attending: Neurology | Admitting: Neurology

## 2018-07-21 DIAGNOSIS — I10 Essential (primary) hypertension: Secondary | ICD-10-CM | POA: Diagnosis not present

## 2018-07-21 DIAGNOSIS — L02211 Cutaneous abscess of abdominal wall: Secondary | ICD-10-CM | POA: Diagnosis not present

## 2018-07-21 DIAGNOSIS — T8189XA Other complications of procedures, not elsewhere classified, initial encounter: Secondary | ICD-10-CM | POA: Diagnosis not present

## 2018-07-23 ENCOUNTER — Ambulatory Visit: Payer: Medicare Other | Admitting: Dietician

## 2018-07-28 ENCOUNTER — Telehealth: Payer: Self-pay

## 2018-07-28 DIAGNOSIS — Z6841 Body Mass Index (BMI) 40.0 and over, adult: Secondary | ICD-10-CM | POA: Diagnosis not present

## 2018-07-28 DIAGNOSIS — F419 Anxiety disorder, unspecified: Secondary | ICD-10-CM | POA: Diagnosis not present

## 2018-07-28 DIAGNOSIS — L02211 Cutaneous abscess of abdominal wall: Secondary | ICD-10-CM | POA: Diagnosis not present

## 2018-07-28 DIAGNOSIS — R51 Headache: Secondary | ICD-10-CM | POA: Diagnosis not present

## 2018-07-28 DIAGNOSIS — I10 Essential (primary) hypertension: Secondary | ICD-10-CM | POA: Diagnosis not present

## 2018-07-28 DIAGNOSIS — Z299 Encounter for prophylactic measures, unspecified: Secondary | ICD-10-CM | POA: Diagnosis not present

## 2018-07-28 NOTE — Telephone Encounter (Addendum)
Dr. A.Shah told her to call Dr. [name] office and set a medication contract so she can be controlled by one doctor for narcotics. She did get medicine for post surgery 7 days. She stated that he called her a drug seeker, etc. Because she has gotten narcotics from a couple of different doctors due to kidney stones, procedures, etc. I routed this to Dr. Dossie Arbour also

## 2018-07-29 DIAGNOSIS — I10 Essential (primary) hypertension: Secondary | ICD-10-CM | POA: Diagnosis not present

## 2018-07-29 DIAGNOSIS — L02211 Cutaneous abscess of abdominal wall: Secondary | ICD-10-CM | POA: Diagnosis not present

## 2018-07-29 DIAGNOSIS — T8189XA Other complications of procedures, not elsewhere classified, initial encounter: Secondary | ICD-10-CM | POA: Diagnosis not present

## 2018-07-29 NOTE — Telephone Encounter (Signed)
Called patient. She states she did not have this conversation with Blanch [employer] yesterday and that she was not asking for pain medication. I informed her of Dr. [name] note about his conversation with her on 07/19/18. He is not going to prescribe her any narcotics and she is aware of this. States she just called to confirm her upcoming appointment.

## 2018-07-30 ENCOUNTER — Encounter (HOSPITAL_COMMUNITY): Payer: Self-pay | Admitting: Psychiatry

## 2018-07-30 ENCOUNTER — Other Ambulatory Visit: Payer: Self-pay

## 2018-07-30 ENCOUNTER — Ambulatory Visit (INDEPENDENT_AMBULATORY_CARE_PROVIDER_SITE_OTHER): Payer: Medicare Other | Admitting: Psychiatry

## 2018-07-30 DIAGNOSIS — Z5181 Encounter for therapeutic drug level monitoring: Secondary | ICD-10-CM | POA: Diagnosis not present

## 2018-07-30 DIAGNOSIS — I639 Cerebral infarction, unspecified: Secondary | ICD-10-CM

## 2018-07-30 DIAGNOSIS — F411 Generalized anxiety disorder: Secondary | ICD-10-CM

## 2018-07-30 DIAGNOSIS — G43109 Migraine with aura, not intractable, without status migrainosus: Secondary | ICD-10-CM

## 2018-07-30 DIAGNOSIS — F445 Conversion disorder with seizures or convulsions: Secondary | ICD-10-CM | POA: Diagnosis not present

## 2018-07-30 DIAGNOSIS — F332 Major depressive disorder, recurrent severe without psychotic features: Secondary | ICD-10-CM | POA: Diagnosis not present

## 2018-07-30 DIAGNOSIS — G43611 Persistent migraine aura with cerebral infarction, intractable, with status migrainosus: Secondary | ICD-10-CM | POA: Diagnosis not present

## 2018-07-30 DIAGNOSIS — F4310 Post-traumatic stress disorder, unspecified: Secondary | ICD-10-CM | POA: Diagnosis not present

## 2018-07-30 DIAGNOSIS — F4001 Agoraphobia with panic disorder: Secondary | ICD-10-CM | POA: Diagnosis not present

## 2018-07-30 MED ORDER — FLUOXETINE HCL 40 MG PO CAPS: 80.0000 mg | ORAL_CAPSULE | ORAL | 1 refills | Status: DC

## 2018-07-30 MED ORDER — BUSPIRONE HCL 15 MG PO TABS: 15.0000 mg | ORAL_TABLET | Freq: Three times a day (TID) | ORAL | 2 refills | Status: DC

## 2018-07-30 MED ORDER — LITHIUM CARBONATE ER 300 MG PO TBCR: 1200.0000 mg | EXTENDED_RELEASE_TABLET | Freq: Every day | ORAL | 2 refills | Status: DC

## 2018-07-30 MED ORDER — DIAZEPAM 5 MG PO TABS: 5.0000 mg | ORAL_TABLET | Freq: Three times a day (TID) | ORAL | 2 refills | Status: DC | PRN

## 2018-07-30 NOTE — Progress Notes (Signed)
Virtual Visit via Telephone Note  I connected with [place] on 07/30/18 at 10:00 AM EDT by telephone and verified that I am speaking with the correct person using two identifiers.  Location: Patient: home Provider: office   I discussed the limitations, risks, security and privacy concerns of performing an evaluation and management service by telephone and the availability of in person appointments. I also discussed with the patient that there may be a patient responsible charge related to this service. The patient expressed understanding and agreed to proceed.   History of Present Illness: "I am doing alright". Pt had recent surgery to remove an abscess on the underside of her abdomen. Pt states her seizures "are under control the way we are doing the meds. I am doing really well with it". Anxiety is present and worse when she leaves the house due to [place]. Pt is very worried about her 39yo daughter and going to preschool. Her sister continues to do therapy with the patient. Pt is still waiting to have an ADHD assessment next week. Depression continues but denies SI/HI. It is due to "circumstances of the world that has made it worse". Sleep is poor due to "night terrors". Triggers in the day cause racing thoughts and anxiety. Her ex has decided to sign his rights away on the their daughter because he does not want to pay child support. It is a relief to the patient. Pt is trying to get along with her parents as much as possible. Her chronic pain is manageable. Pt states the lithium has been very helpful and she has some really good days.    Observations/Objective: I spoke with Emily [name] on the phone.  Pt was calm, pleasant and cooperative.  Pt was engaged in the conversation and answered questions appropriately.  Speech was clear and coherent with normal rate, tone and volume.  Mood is depressed and anxious, affect is congruent. Thought processes are coherent,circumstantial.  Thought  content is with ruminations.  Pt denies SI/HI.   Pt denies auditory and visual hallucinations and did not appear to be responding to internal stimuli.  Memory and concentration are good.  Fund of knowledge and use of language are average.  Insight and judgment are fair.  I am unable to comment on psychomotor activity, general appearance, hygiene, or eye contact as I was unable to physically see the patient on the phone.  Vital signs not available since interview conducted virtually.    Assessment and Plan: GAD; Conversion d/o with pseudoseizures; MDD-recurrent, moderate; PTSD; Insomnia  Increase Lithium CR 1200mg  po qD- pt is has helped alot  Prozac 80mg  po qD  Buspar 15mg  po TID  Valium 5mg  TID prn- taking for months without any reported SE or AR  Topamax and Depakote prescribed by neurologist  Ordered- lithium level, tsh and CMP   Follow Up Instructions: In 6-8 weeks or sooner if needed   I discussed the assessment and treatment plan with the patient. The patient was provided an opportunity to ask questions and all were answered. The patient agreed with the plan and demonstrated an understanding of the instructions.   The patient was advised to call back or seek an in-person evaluation if the symptoms worsen or if the condition fails to improve as anticipated.  I provided 25 minutes of non-face-to-face time during this encounter.   Emily Cradle, MD

## 2018-07-31 ENCOUNTER — Ambulatory Visit: Payer: Medicare Other | Admitting: Psychology

## 2018-07-31 DIAGNOSIS — L02211 Cutaneous abscess of abdominal wall: Secondary | ICD-10-CM | POA: Diagnosis not present

## 2018-07-31 DIAGNOSIS — Z79899 Other long term (current) drug therapy: Secondary | ICD-10-CM | POA: Diagnosis not present

## 2018-07-31 DIAGNOSIS — Z888 Allergy status to other drugs, medicaments and biological substances status: Secondary | ICD-10-CM | POA: Diagnosis not present

## 2018-07-31 DIAGNOSIS — F329 Major depressive disorder, single episode, unspecified: Secondary | ICD-10-CM | POA: Diagnosis not present

## 2018-07-31 DIAGNOSIS — M1990 Unspecified osteoarthritis, unspecified site: Secondary | ICD-10-CM | POA: Diagnosis not present

## 2018-07-31 DIAGNOSIS — Z886 Allergy status to analgesic agent status: Secondary | ICD-10-CM | POA: Diagnosis not present

## 2018-07-31 DIAGNOSIS — Z9049 Acquired absence of other specified parts of digestive tract: Secondary | ICD-10-CM | POA: Diagnosis not present

## 2018-07-31 DIAGNOSIS — Z87442 Personal history of urinary calculi: Secondary | ICD-10-CM | POA: Diagnosis not present

## 2018-07-31 DIAGNOSIS — F419 Anxiety disorder, unspecified: Secondary | ICD-10-CM | POA: Diagnosis not present

## 2018-07-31 DIAGNOSIS — G35 Multiple sclerosis: Secondary | ICD-10-CM | POA: Diagnosis not present

## 2018-07-31 DIAGNOSIS — G43909 Migraine, unspecified, not intractable, without status migrainosus: Secondary | ICD-10-CM | POA: Diagnosis not present

## 2018-08-04 ENCOUNTER — Ambulatory Visit: Payer: Medicare Other | Admitting: Pain Medicine

## 2018-08-04 DIAGNOSIS — Z299 Encounter for prophylactic measures, unspecified: Secondary | ICD-10-CM | POA: Diagnosis not present

## 2018-08-04 DIAGNOSIS — L02211 Cutaneous abscess of abdominal wall: Secondary | ICD-10-CM | POA: Diagnosis not present

## 2018-08-04 DIAGNOSIS — G8929 Other chronic pain: Secondary | ICD-10-CM | POA: Diagnosis not present

## 2018-08-04 DIAGNOSIS — Z6841 Body Mass Index (BMI) 40.0 and over, adult: Secondary | ICD-10-CM | POA: Diagnosis not present

## 2018-08-04 DIAGNOSIS — F419 Anxiety disorder, unspecified: Secondary | ICD-10-CM | POA: Diagnosis not present

## 2018-08-04 DIAGNOSIS — I10 Essential (primary) hypertension: Secondary | ICD-10-CM | POA: Diagnosis not present

## 2018-08-05 ENCOUNTER — Telehealth (HOSPITAL_COMMUNITY): Payer: Self-pay

## 2018-08-05 NOTE — Telephone Encounter (Signed)
Patient called, she said that her nightmares / night terrors have continued and gotten worse over the last few days. Please review and advise, thank you

## 2018-08-06 ENCOUNTER — Telehealth: Payer: Self-pay | Admitting: Neurology

## 2018-08-06 DIAGNOSIS — G35 Multiple sclerosis: Secondary | ICD-10-CM

## 2018-08-06 NOTE — Telephone Encounter (Signed)
Patient is calling in about tremor starting in her hand and moving up to her shoulder. She said it recently started and it's been worse today. She is also wanting to know about the MRI at [facility]. Please call her back at [phone]. Thanks!

## 2018-08-06 NOTE — Telephone Encounter (Signed)
Pt c/o increasing tremor in both arms. Has been for two weeks but yesterday seemed to have increased in severity. Left arm "twitches" constantly from hand all the way up to shoulder. She states it looks like she is in an [place]. Right arm is also constant tremor but "not as bad/not so noticeable"  Taking her Depaoke at bedtime and the Topamax mid morning and bedtime.   She states she is sweating all the time even inside with the air conditioning and didn't know if that was related or if she is starting menopause  She also asked about her brain MRI with sedation that was ordered at her last visit. Her last televisit was 05/05/18 with Dr. Delice Lesch. MRI with sedation was scheduled  7/14 but patient said she had emergency surgery and that is why she did not have MRI.    Since with sedation patient will need to have appt with MD 30 days or less prior will check MD schedule then book MRI after that.   Any recommendations for patient with increased tremors?

## 2018-08-07 DIAGNOSIS — I10 Essential (primary) hypertension: Secondary | ICD-10-CM | POA: Diagnosis not present

## 2018-08-07 DIAGNOSIS — T8189XA Other complications of procedures, not elsewhere classified, initial encounter: Secondary | ICD-10-CM | POA: Diagnosis not present

## 2018-08-07 DIAGNOSIS — L02211 Cutaneous abscess of abdominal wall: Secondary | ICD-10-CM | POA: Diagnosis not present

## 2018-08-07 NOTE — Telephone Encounter (Signed)
Patient called and information given to her that when MRI calls back and it is scheduled I will call her back - verbalizes understanding.

## 2018-08-07 NOTE — Telephone Encounter (Signed)
Called [facility] for MRI with sedation and left a message that this patient needed to be scheduled. Called patient to make her aware that Dr. Delice Lesch is not making any med changes at this time and as soon as the scheduling desk calls back and she has an appt will make patient aware. Also appt at least 30 days prior with Dr. Delice Lesch will be made at that time.  Called patient's phone and voice mail box was full so no message was able to be left.

## 2018-08-07 NOTE — Addendum Note (Signed)
Addended by: Jesse Fall on: 08/07/2018 03:30 PM   Modules accepted: Orders

## 2018-08-07 NOTE — Telephone Encounter (Signed)
She has a lot of anxiety and is on several medications, would not start something for tremors for now. Recommend proceeding with the MRI brain as previously planned, pls send another order for MRI brain with and without contrast under sedation, and once we have a date, I will add her on my schedule before MRI date. Thanks!

## 2018-08-10 ENCOUNTER — Encounter: Payer: Self-pay | Admitting: Pain Medicine

## 2018-08-10 NOTE — Telephone Encounter (Addendum)
Called patient to make her aware of following appts related to her MRI with sedation:  [name] virtual appt is 8/13 at 0830.  Covid testing is Monday, August 24 at 1045 at the [facility]  MRI brain with/without sedation is at [facility] Thursday, Aug 27. Be at admitting at 0600 to check in and MRI at 0800. (Scheduled with Vivien Rota at [facility] this am). Received fax from centralized scheduling and MD will have H&P completed after office visit and this will be faxed back to MRI scheduling prior to MRI.  Patient verbalizes understanding of all above. Verified she is not pregnant (question asked from MRI).

## 2018-08-11 ENCOUNTER — Ambulatory Visit: Payer: Medicare Other | Admitting: Psychology

## 2018-08-11 ENCOUNTER — Other Ambulatory Visit: Payer: Self-pay

## 2018-08-11 ENCOUNTER — Ambulatory Visit (INDEPENDENT_AMBULATORY_CARE_PROVIDER_SITE_OTHER): Payer: Medicaid Other | Admitting: Psychology

## 2018-08-11 ENCOUNTER — Ambulatory Visit: Payer: Medicare Other | Attending: Pain Medicine | Admitting: Pain Medicine

## 2018-08-11 DIAGNOSIS — F410 Panic disorder [episodic paroxysmal anxiety] without agoraphobia: Secondary | ICD-10-CM | POA: Diagnosis not present

## 2018-08-11 DIAGNOSIS — M47816 Spondylosis without myelopathy or radiculopathy, lumbar region: Secondary | ICD-10-CM | POA: Diagnosis not present

## 2018-08-11 DIAGNOSIS — F4310 Post-traumatic stress disorder, unspecified: Secondary | ICD-10-CM | POA: Diagnosis not present

## 2018-08-11 DIAGNOSIS — F319 Bipolar disorder, unspecified: Secondary | ICD-10-CM | POA: Diagnosis not present

## 2018-08-11 DIAGNOSIS — F909 Attention-deficit hyperactivity disorder, unspecified type: Secondary | ICD-10-CM | POA: Diagnosis not present

## 2018-08-11 NOTE — Progress Notes (Signed)
Pain Management Virtual Encounter Note - Virtual Visit via Telephone Telehealth (real-time audio visits between healthcare provider and patient).   Patient's Phone No. & Preferred Pharmacy:  [phone] (home); [phone] (mobile); (Preferred) [phone] [email or web address]  [id number] [place], [place] [address] [place] [address] Phone: [phone] Fax: [phone]    Pre-screening note:  Our staff contacted Emily [name] and offered her an "in person", "face-to-face" appointment versus a telephone encounter. She indicated preferring the telephone encounter, at this time.   Reason for Virtual Visit: COVID-19*  Social distancing based on CDC and AMA recommendations.   I contacted Bryton L Aguilera on 08/11/2018 via telephone.      I clearly identified myself as Gaspar Cola, MD. I verified that I was speaking with the correct person using two identifiers (Name: Emily [name], and date of birth: 1980/03/06).  Advanced Informed Consent I sought verbal advanced consent from Emily [name] for virtual visit interactions. I informed Emily [name] of possible security and privacy concerns, risks, and limitations associated with providing "not-in-person" medical evaluation and management services. I also informed Emily [name] of the availability of "in-person" appointments. Finally, I informed her that there would be a charge for the virtual visit and that she could be  personally, fully or partially, financially responsible for it. Ms. Noa expressed understanding and agreed to proceed.   Historic Elements   Emily [name] is a 38 y.o. year old, female patient evaluated today after her last encounter by our practice on 07/28/2018. Emily [name]  has a past medical history of Anxiety, Asthma (as a child), Chronic back pain, Chronic pain, Conversion disorder, DDD (degenerative disc disease), cervical, Depression, External  hemorrhoid, Fatty liver, GERD (gastroesophageal reflux disease), History of kidney stones, HTN (hypertension) (02/27/2015), JC virus antibody positive, Migraines, MS (multiple sclerosis) ([place]), Neuropathy, OA (osteoarthritis), Ovarian cyst, Panic attack, Perforated bowel ([place]) (2009), Pseudoseizures, PTSD (post-traumatic stress disorder), S/P emergency C-section, Seasonal allergies, Urinary urgency, Vertigo, Wears contact lenses, and Wears glasses. 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 Emily [name] has a current medication list which includes the following prescription(s): albuterol, amlodipine, buspirone, diazepam, divalproex, dronabinol, epinephrine, erenumab-aooe, etonogestrel, fingolimod hcl, fluoxetine, lithium carbonate, loratadine, methocarbamol, multiple vitamins-minerals, NON FORMULARY, omeprazole, pantoprazole, pramoxine, pregabalin, sulfamethoxazole-trimethoprim, and topiramate, and the following Facility-Administered Medications: metoclopramide. She  reports that she quit smoking about 3 years ago. Her smoking use included cigarettes. She has a 2.50 pack-year smoking history. She has never used smokeless tobacco. She reports that she does not drink alcohol or use drugs. Emily [name] is allergic to amitriptyline; baclofen; duloxetine hcl; gabapentin; hydrocodone-acetaminophen; magnesium salicylate; modafinil; monosodium glutamate; other; tizanidine; alprazolam; rizatriptan; vicodin [hydrocodone-acetaminophen]; adhesive [tape]; ketorolac tromethamine;  lamotrigine; and tramadol.   HPI  Today, she is being contacted for a post-procedure assessment.  Post-Procedure Evaluation  Procedure: Diagnostic bilateral lumbar facet block #2 under fluoroscopic guidance and IV sedation Pre-procedure pain level:  10/10 Post-procedure: 0/10 (100% relief)  Sedation: Sedation provided.  Effectiveness during initial hour after procedure(Ultra-Short Term Relief): 100 %   Local anesthetic used: Long-acting (4-6 hours) Effectiveness: Defined as any  analgesic benefit obtained secondary to the administration of local anesthetics. This carries significant diagnostic value as to the etiological location, or anatomical origin, of the pain. Duration of benefit is expected to coincide with the duration of the local anesthetic used.  Effectiveness during initial 4-6 hours after procedure(Short-Term Relief): 100 %   Long-term benefit: Defined as any relief past the pharmacologic duration of the local anesthetics.  Effectiveness past the initial 6 hours after procedure(Long-Term Relief): 85 %(lasting 3 weeks)   Current benefits: Defined as benefit that persist at this time.   Analgesia:  >50% relief Function: Somewhat improved ROM: Somewhat improved   Plan: Proceed with bilateral lumbar facet radiofrequency ablation under fluoroscopic guidance and IV sedation starting with the left side.  Medical Necessity: Ms. Batley has experienced debilitating chronic pain from the Lumbosacral Facet Syndrome (Spondylosis without myelopathy or radiculopathy, lumbosacral region [M47.817]) that has persisted for longer than three months of failed non-surgical care and has either failed to respond, or was unable to tolerate, or simply did not get enough benefit from other more conservative therapies including, but not limited to: 1. Over-the-counter oral analgesic medications (i.e.: ibuprofen, naproxen, etc.) 2. Anti-inflammatory medications 3. Muscle relaxants 4. Membrane  stabilizers 5. Opioids 6. Physical therapy (PT), chiropractic manipulation, and/or home exercise program (HEP). 7. Modalities (Heat, ice, etc.) 8. Invasive techniques such as nerve blocks.  Emily [name] has attained greater than 50% reduction in pain from at least two (2) diagnostic medial branch blocks conducted in separate occasions. For this reason, I believe it is medically necessary to proceed with Non-Pulsed Radiofrequency Ablation for the purpose of attempting to prolong the duration of the benefits seen with the diagnostic injections.   Pharmacotherapy Assessment  Analgesic: None from our practice.   Monitoring: Pharmacotherapy: No side-effects or adverse reactions reported.  PMP: PDMP not reviewed this encounter.       Compliance: No problems identified. Effectiveness: Clinically acceptable. Plan: Refer to "POC".  UDS:  Summary  Date Value Ref Range Status  03/16/2018 FINAL  Final    Comment:    ==================================================================== TOXASSURE COMP DRUG ANALYSIS,UR ==================================================================== Test                             Result       Flag       Units Drug Present and Declared for Prescription Verification   Desmethyldiazepam              214          EXPECTED   ng/mg creat   Oxazepam                       1241         EXPECTED   ng/mg creat   Temazepam                      1052         EXPECTED   ng/mg creat    Desmethyldiazepam, oxazepam, and temazepam are expected    metabolites of diazepam. Desmethyldiazepam and oxazepam are also    expected metabolites of other drugs, including chlordiazepoxide,    prazepam, clorazepate, and halazepam. Oxazepam is an expected    metabolite of temazepam. Oxazepam and temazepam are also    available as scheduled prescription medications.   Pregabalin  PRESENT      EXPECTED   Topiramate                     PRESENT      EXPECTED    Methocarbamol                  PRESENT      EXPECTED   Fluoxetine                     PRESENT      EXPECTED   Norfluoxetine                  PRESENT      EXPECTED    Norfluoxetine is an expected metabolite of fluoxetine. Drug Absent but Declared for Prescription Verification   Prochlorperazine               Not Detected UNEXPECTED   Ibuprofen                      Not Detected UNEXPECTED    Ibuprofen, as indicated in the declared medication list, is not    always detected even when used as directed. ==================================================================== Test                      Result    Flag   Units      Ref Range   Creatinine              29               mg/dL      >=20 ==================================================================== Declared Medications:  The flagging and interpretation on this report are based on the  following declared medications.  Unexpected results may arise from  inaccuracies in the declared medications.  **Note: The testing scope of this panel includes these medications:  Diazepam (Valium)  Fluoxetine (Prozac)  Methocarbamol (Robaxin)  Pregabalin (Lyrica)  Prochlorperazine (Compazine)  Topiramate (Topamax)  **Note: The testing scope of this panel does not include small to  moderate amounts of these reported medications:  Ibuprofen  **Note: The testing scope of this panel does not include following  reported medications:  Albuterol  Amlodipine (Norvasc)  Buspirone (BuSpar)  Divalproex (Depakote)  Epinephrine (EpiPen)  Etonogestrel (Nexplanon)  Fingolimod ([place])  Lithium (Lithobid)  Loratadine (Claritin)  Multivitamin  Ranitidine (Zantac) ==================================================================== For clinical consultation, please call [phone]. ====================================================================    Laboratory Chemistry Profile (12 mo)  Renal: 03/16/2018: BUN/Creatinine Ratio 16 06/08/2018: BUN  13; Creatinine, Ser 0.63; GFR calc Af Amer >60; GFR calc non Af Amer >60  Hepatic: 06/08/2018: Albumin 4.0; ALT 15; AST 18 Other: 03/16/2018: 25-Hydroxy, Vitamin D 20; 25-Hydroxy, Vitamin D-2 4.7; 25-Hydroxy, Vitamin D-3 15; CRP 6; Sed Rate 20; Vitamin B-12 521 Note: Above Lab results reviewed.  Imaging  Last 90 days:  Dg Pain Clinic C-arm 1-60 Min No Report  Result Date: 07/09/2018 Fluoro was used, but no Radiologist interpretation will be provided. Please refer to "NOTES" tab for provider progress note.  Last Hospital Admission:  No results found. Assessment  The encounter diagnosis was Lumbar facet syndrome (Bilateral) (R>L).  Plan of Care  I am having Emily [name] maintain her EPINEPHrine, albuterol, Multiple Vitamins-Minerals (MULTIVITAMIN ADULT PO), amLODipine, etonogestrel, loratadine, Fingolimod HCl, topiramate, divalproex, Erenumab-aooe, NON FORMULARY, omeprazole, methocarbamol, pregabalin, dronabinol, pantoprazole, busPIRone, lithium carbonate, diazepam, FLUoxetine, pramoxine, and sulfamethoxazole-trimethoprim.  Pharmacotherapy (Medications Ordered): No orders of the defined types  were placed in this encounter.  Orders:  Orders Placed This Encounter  Procedures  . Radiofrequency,Lumbar    Standing Status:   Future    Standing Expiration Date:   02/11/2020    Scheduling Instructions:     Side(s): Left-sided     Level: L3-4, L4-5, & L5-S1 Facets (L2, L3, L4, L5, & S1 Medial Branch Nerves)     Sedation: With Sedation     Scheduling Timeframe: As soon as pre-approved    Order Specific Question:   Where will this procedure be performed?    Answer:   [facility]   Follow-up plan:   Return for RFA: (L) L-FCT RFA #1 (w/ sedation).      Interventional management options: Considering:   Diagnosticmidline thoracic ESI Diagnosticbilateral thoracic facet block  Possiblebilateral thoracic RFA Diagnostic right sided LESI Therapeutic bilateral lumbar facet RFA #1   Diagnosticleft IA knee injection Diagnosticleft knee genicular NB Possibleleft genicular nerve RFA  Diagnosticright-sided CESI Diagnosticright-sided cervical facet block Diagnosticright-sided cervical facet RFA Diagnosticright occipital NB   Palliative PRN treatment(s):   Palliative bilateral lumbar facet block #3     Recent Visits Date Type Provider Dept  07/09/18 Procedure visit Milinda Pointer, MD [facility]  06/24/18 Office Visit Milinda Pointer, MD [facility]  Showing recent visits within past 90 days and meeting all other requirements   Today's Visits Date Type Provider Dept  08/11/18 Office Visit Milinda Pointer, MD [facility]  Showing today's visits and meeting all other requirements   Future Appointments Date Type Provider Dept  08/25/18 Appointment Milinda Pointer, MD [facility]  09/21/18 Appointment Milinda Pointer, MD [facility]  Showing future appointments within next 90 days and meeting all other requirements   I discussed the assessment and treatment plan with the patient. The patient was provided an opportunity to ask questions and all were answered. The patient agreed with the plan and demonstrated an understanding of the instructions.  Patient advised to call back or seek an in-person evaluation if the symptoms or condition worsens.  Total duration of non-face-to-face encounter: 12 minutes.  Note by: Gaspar Cola, MD Date: 08/11/2018; Time: 2:37 PM  Note: This dictation was prepared with Dragon dictation. Any transcriptional errors that may result from this process are unintentional.  Disclaimer:  * Given the special circumstances of the COVID-19 pandemic, the federal government has announced that the Office for Civil Rights (OCR) will exercise its enforcement discretion and will not impose penalties on physicians using telehealth in the event of noncompliance with  regulatory requirements under the [place] and [place] (HIPAA) in connection with the good faith provision of telehealth during the GYKZL-93 national public health emergency. ([place])

## 2018-08-11 NOTE — Patient Instructions (Signed)

## 2018-08-13 ENCOUNTER — Other Ambulatory Visit (HOSPITAL_COMMUNITY): Payer: Self-pay

## 2018-08-13 DIAGNOSIS — Z79899 Other long term (current) drug therapy: Secondary | ICD-10-CM | POA: Diagnosis not present

## 2018-08-13 DIAGNOSIS — Z5181 Encounter for therapeutic drug level monitoring: Secondary | ICD-10-CM

## 2018-08-13 DIAGNOSIS — I10 Essential (primary) hypertension: Secondary | ICD-10-CM | POA: Diagnosis not present

## 2018-08-13 DIAGNOSIS — T8189XA Other complications of procedures, not elsewhere classified, initial encounter: Secondary | ICD-10-CM | POA: Diagnosis not present

## 2018-08-13 DIAGNOSIS — L02211 Cutaneous abscess of abdominal wall: Secondary | ICD-10-CM | POA: Diagnosis not present

## 2018-08-15 LAB — COMPREHENSIVE METABOLIC PANEL
ALT: 17 IU/L (ref 0–32)
AST: 25 IU/L (ref 0–40)
Albumin/Globulin Ratio: 1.7 (ref 1.2–2.2)
Albumin: 4.3 g/dL (ref 3.8–4.8)
Alkaline Phosphatase: 96 IU/L (ref 39–117)
BUN/Creatinine Ratio: 12 (ref 9–23)
BUN: 13 mg/dL (ref 6–24)
Bilirubin Total: <0.2 mg/dL (ref 0.0–1.2)
CO2: 22 mmol/L (ref 20–29)
Calcium: 8.8 mg/dL (ref 8.7–10.2)
Chloride: 101 mmol/L (ref 96–106)
Creatinine, Ser: 1.06 mg/dL — ABNORMAL HIGH (ref 0.57–1.00)
GFR calc Af Amer: 76 mL/min/{1.73_m2} (ref >59)
GFR calc non Af Amer: 66 mL/min/{1.73_m2} (ref >59)
Globulin, Total: 2.5 g/dL (ref 1.5–4.5)
Glucose: 117 mg/dL — ABNORMAL HIGH (ref 65–99)
Potassium: 4.1 mmol/L (ref 3.5–5.2)
Sodium: 139 mmol/L (ref 134–144)
Total Protein: 6.8 g/dL (ref 6.0–8.5)

## 2018-08-15 LAB — TSH: TSH: 1.43 u[IU]/mL (ref 0.450–4.500)

## 2018-08-15 LAB — LITHIUM LEVEL: Lithium Lvl: 0.5 mmol/L — ABNORMAL LOW (ref 0.6–1.2)

## 2018-08-18 DIAGNOSIS — I10 Essential (primary) hypertension: Secondary | ICD-10-CM | POA: Diagnosis not present

## 2018-08-18 DIAGNOSIS — L02211 Cutaneous abscess of abdominal wall: Secondary | ICD-10-CM | POA: Diagnosis not present

## 2018-08-18 DIAGNOSIS — T8189XA Other complications of procedures, not elsewhere classified, initial encounter: Secondary | ICD-10-CM | POA: Diagnosis not present

## 2018-08-19 ENCOUNTER — Ambulatory Visit: Payer: Medicare Other | Admitting: Dietician

## 2018-08-20 ENCOUNTER — Encounter: Payer: Self-pay | Admitting: Neurology

## 2018-08-20 ENCOUNTER — Telehealth (INDEPENDENT_AMBULATORY_CARE_PROVIDER_SITE_OTHER): Payer: Medicare Other | Admitting: Neurology

## 2018-08-20 ENCOUNTER — Telehealth: Payer: Self-pay

## 2018-08-20 ENCOUNTER — Telehealth: Payer: Self-pay | Admitting: Neurology

## 2018-08-20 ENCOUNTER — Other Ambulatory Visit: Payer: Self-pay

## 2018-08-20 VITALS — BP 135/90 | HR 90 | Ht 65.0 in | Wt 260.0 lb

## 2018-08-20 DIAGNOSIS — T8189XA Other complications of procedures, not elsewhere classified, initial encounter: Secondary | ICD-10-CM | POA: Diagnosis not present

## 2018-08-20 DIAGNOSIS — G35 Multiple sclerosis: Secondary | ICD-10-CM | POA: Diagnosis not present

## 2018-08-20 DIAGNOSIS — F411 Generalized anxiety disorder: Secondary | ICD-10-CM

## 2018-08-20 DIAGNOSIS — I10 Essential (primary) hypertension: Secondary | ICD-10-CM | POA: Diagnosis not present

## 2018-08-20 DIAGNOSIS — G894 Chronic pain syndrome: Secondary | ICD-10-CM

## 2018-08-20 DIAGNOSIS — L02211 Cutaneous abscess of abdominal wall: Secondary | ICD-10-CM | POA: Diagnosis not present

## 2018-08-20 DIAGNOSIS — F445 Conversion disorder with seizures or convulsions: Secondary | ICD-10-CM

## 2018-08-20 DIAGNOSIS — G43709 Chronic migraine without aura, not intractable, without status migrainosus: Secondary | ICD-10-CM

## 2018-08-20 NOTE — Telephone Encounter (Signed)
She said she was told to call back with Blood Pressure: 135/90. Thanks!

## 2018-08-20 NOTE — Progress Notes (Signed)
Initiated on Cover My Meds Key: [place]  PA Case: [id number], Status: Approved, Coverage Starts on: 01/07/2018 12:00:00 AM, Coverage Ends on: 01/07/2019 12:00:00 AM. Questions? Contact [phone]  Faxed approval to [facility] in [place],  attnButch Penny f# [phone]

## 2018-08-20 NOTE — Progress Notes (Signed)
Virtual Visit via Video Note The purpose of this virtual visit is to provide medical care while limiting exposure to the novel coronavirus.    Consent was obtained for video visit:  Yes.   Answered questions that patient had about telehealth interaction:  Yes.   I discussed the limitations, risks, security and privacy concerns of performing an evaluation and management service by telemedicine. I also discussed with the patient that there may be a patient responsible charge related to this service. The patient expressed understanding and agreed to proceed.  Pt location: Home Physician Location: office Name of referring provider:  Monico Blitz, MD I connected with Emily [name] at patients initiation/request on 08/20/2018 at  8:30 AM EDT by video enabled telemedicine application and verified that I am speaking with the correct person using two identifiers. Pt MRN:  [id number] Pt DOB:  03-Dec-1980 Video Participants:  Emily [name]   History of Present Illness:  The patient had a virtual video visit on 08/20/2018. She was last seen in the neurology clinic in 4 months ago for MS and migraines. 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 She has not had the stress seizures recently because her psychiatrist has increased the diazepam to 3 tablets daily. She splits them out throughout the day taking 1/2 tab every few hours. She lives with her parents and states it is not the calmest place. Her Lithium was increased a month ago to 4 tablets daily. She reports an increase in tremors in her hands, R>L. She notices it when she holds her phone up, it stops when she holds her hand against something. It is not as bad when using a fork or pencil, she can draw circles without difficulty. She reports that she is being tested for ADHD. She continues to have bad migraines and felt that the first dose of Aimovig did help, no side effects, however each time she [facility],  they would tell her it was not available. She has not received any further doses since April. She is also on Topiramate 150mg  BID and Depakote 500mg  qhs for migraine prophylaxis. She has dizziness in the evenings, but does okay in the morning. She gets a little confused when she is overwhelmed. She has numbness and tingling in her fingertips, more around the sides of her 2nd and 5th digits. She continues to see Pain Management and takes Lyrica 300mg  qhs. She is scheduled for an MRI brain on 8/27 for follow-up on MS. She is on Gilenya without side effects. She reports a recent surgery for hemorrhoids, then recently an abscess in her lower abdominal region which is still draining.   Her last MRI brain with and without contrast done 05/2017 did not show any acute changes, there were greater than 10 supratentorial subcentimeter white matter lesions, including new lesions. Some lesions have appearance of demyelination though there may be component of chronic small vessel ischemic changes. No abnormal enhancement.  HPI 03/28/15: This is a pleasant 38 yo RH woman with a history of relapsing-remitting multiple sclerosis, headaches, anxiety with panic attacks, conversion disorder with seizures. She presented at [redacted] weeks pregnant to determine if diazepam for anxiety/seizures would be a medication she can use, understanding this is pregnancy category D versus risks of injuring herself from falls when she has the psychogenic seizures. Records from her previous neurologists at [place] were reviewed. She was diagnosed with MS in June 2008 while living in [place] state, she started  having right thigh pain and right eye blurred vision. She was diagnosed with right optic neuritis and underwent MRI brain and lumbar puncture which confirmed a diagnosis of MS. 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 She has been on Gilenya since 2014 without any relapse. This was discontinued in December 2016 when she found out she was pregnant. She has a history of chronic pain and migraines, and was taking Depakote and Topamax for headache prophylaxis, also stopped in December 2016. She reports having Botox for her migraines, but so far migraines are not as bad as they were.  She started having shaking episodes in April 2016. Initially Depakote dose was increased to 500mg  BID. She was back in May 2016 with multiple episodes of syncope, palpitations, a sensation of her heart jumping out of her chest. She also reported lightheadedness, facial numbness, and numbness in her hands and feet. On her last visit in June 2016 at [facility], shaking episodes were discussed. Events always occur after a stressful event. She had a spell in [place] where she had a normal EEG and was diagnosed with a conversion disorder. She had seen Dr. Darleene Cleaver and reportedly told her he could not help her because "her case was too complex." She started seeing a psychiatrist in [place], however since she switched neurologists, she reports that he cannot see her anymore. She has not been able to establish care with [facility] and continues to have anxiety and panic attacks "all the time." She is diaphoretic in the office today and reports she always feels anxious. When she starts having one of her seizures, she would have significant anxiety, "my body can't deal and goes into a seizure," she hears her heartbeat in her ears. If it slowly builds up, she can get herself to a safe place, however other times it "just hits me," and she falls to the ground and has injured herself. She reports falling out one time in the bathroom during a doctor visit. She is currently on Fluoxetine and Buspirone. Clonazepam in the past did not help. She would usually go to the ER for these spells, which would quiet down once she gets Benadryl and  Diazepam.  She delivered early at [redacted] weeks gestation last July 12, 2015. A week after delivery, she started having numbness on the top of both feet, pain in the balls of her feet, with numbness traveling up to her mid-back. She also has numbness in both forearms, and reports her palms feel the same as her soles, with constant stabbing burning pain. She had these symptoms pre-pregnancy but states the numbness only went up to her knees. During pregnancy, all her symptoms were less bothersome. She has been following with Pain management in [facility] and would occasionally take Oxycodone prior to her pregnancy.  Diagnostic Data:  I personally reviewed MRI brain with and without contrast done with [facility] last 10/02/13 which did not show any acute changes, there was multiple T2/FLAIR lesions in the subcortical and deep white matter regions, no abnormal enhancement. Hippocampi appear asymmetric, smaller on the right without abnormal signal or enhancement.  MRI C-spine with and without contrast 10/18/13 showed hazy T2 hyperintensity within the cord from C2-3 to C4-5 without abnormal enhancement.  per [facility] - MRI brain done 03/18/14: "No change in numerous multifocal T2 hyperintense white matter lesions relative to prior MRI from 04/17/2013 most consistent  with demyelinating lesions given history of multiple sclerosis. No evidence of active demyelination."  MRI cervical spine done 03/18/14: "Relative to prior MRI from 10/20/2009, no change in patchy foci of increased T2 signal in the dorsal cord at C2- C4 in keeping with demyelinating lesions in this patient with a clinical history of multiple sclerosis.  No evidence of enhancement to suggest active demyelination. Moderately advanced degenerative disease at C5-C6 and C6-C7 with interval development of left eccentric central, foraminal entry zone and foraminal disc extrusion at C6-C7 exerting mass effect on the exiting C7 and descending C8 roots.  MRI brain and  C-spine 07/30/2015 with and without contrast : There was no abnormal enhancement. There were no changes from previous MRI brain in 09/2013 with multiple bilateral subcortical greater than 10 periventricular T2 hyperintensities. There were no cord lesions seen up to T3-4. There was chronic degenerative disc disease and central canal stenosis at C5-6 and C6-7, no abnormal cord signal, unchanged from 06/2014.  EMG/NCV done on both UE and right LE showed bilateral carpal tunnel syndrome, mild to moderate in degree, no evidence of polyneuropathy or radiculopathy on the right side.  Outpatient Encounter Medications as of 08/20/2018  Medication Sig   albuterol (PROVENTIL HFA;VENTOLIN HFA) 108 (90 Base) MCG/ACT inhaler Inhale 2 puffs into the lungs every 6 (six) hours as needed for wheezing or shortness of breath. (Patient taking differently: Inhale 2 puffs into the lungs every 4 (four) hours as needed for wheezing or shortness of breath. )   amLODipine (NORVASC) 5 MG tablet Take 5 mg by mouth every morning.    busPIRone (BUSPAR) 15 MG tablet Take 1 tablet (15 mg total) by mouth 3 (three) times daily.   diazepam (VALIUM) 5 MG tablet Take 1 tablet (5 mg total) by mouth 3 (three) times daily as needed for anxiety.   divalproex (DEPAKOTE) 500 MG DR tablet Take 1 tablet (500 mg total) by mouth at bedtime.   dronabinol (MARINOL) 2.5 MG capsule Take 1 capsule (2.5 mg total) by mouth 3 (three) times daily before meals.   EPINEPHrine (EPIPEN) 0.3 mg/0.3 mL SOAJ injection Inject 0.3 mg into the muscle as needed (allergic reaction). Reported on 03/28/2015   etonogestrel (NEXPLANON) 68 MG IMPL implant 1 each by Subdermal route once.   Fingolimod HCl (GILENYA) 0.5 MG CAPS Take 1 capsule (0.5 mg total) by mouth daily. (Patient taking differently: Take 0.5 mg by mouth every morning. )   FLUoxetine (PROZAC) 40 MG capsule Take 2 capsules (80 mg total) by mouth every morning.   lithium carbonate (LITHOBID) 300 MG CR  tablet Take 4 tablets (1,200 mg total) by mouth at bedtime.   loratadine (CLARITIN) 10 MG tablet Take 10 mg by mouth daily as needed for allergies.   methocarbamol (ROBAXIN) 750 MG tablet Take 1 tablet (750 mg total) by mouth every 8 (eight) hours as needed for muscle spasms.   Multiple Vitamins-Minerals (MULTIVITAMIN ADULT PO) Take 1 tablet by mouth daily.    NON FORMULARY CBD Oil   omeprazole (PRILOSEC) 20 MG capsule TAKE 1 CAPSULE (20 MG TOTAL) BY MOUTH 2 (TWO) TIMES DAILY BEFORE A MEAL.   pregabalin (LYRICA) 100 MG capsule Take 1 capsule (100 mg total) by mouth 3 (three) times daily.   sulfamethoxazole-trimethoprim (BACTRIM DS) 800-160 MG tablet    topiramate (TOPAMAX) 100 MG tablet Take 1.5 tablets twice a day               Facility-Administered Encounter Medications as of 08/20/2018  Medication  metoCLOPramide (REGLAN) injection 10 mg      Observations/Objective:   Vitals:   08/20/18 0825  BP: 135/90  Pulse: 90  Weight: 260 lb (117.9 kg)  Height: 5\' 5"  (1.651 m)   Patient is awake, alert, oriented x 3. No aphasia or dysarthria. Intact fluency and comprehension. Remote and recent memory intact. Able to name and repeat. Cranial nerves: Extraocular movements intact with no nystagmus. No facial asymmetry. Motor: moves all extremities symmetrically. No incoordination on finger to nose testing. Gait: slow and cautious due to pain, no ataxia. She has a postural tremor in both hands.  Assessment and Plan:   This is a 38 yo RH woman with a history of relapsing-remitting multiple sclerosis, migraine with aura, anxiety with panic attacks, conversion disorder with psychogenic seizures. Her MS has been overall stable, she is scheduled for interval MRI brain with and without contrast on 09/03/2018, she needs MRI under sedation due to significant claustrophobia. She continues to report significant migraines on Topiramate 150mg  BID and Depakote 500mg  qhs, she had one dose of Aimovig  which seemed to help, however has been unable to get refills, we will follow-up on this. She sees Pain Management and takes Lyrica 300mg  qhs. She continues to see Psychiatry and will discuss increase in tremors with the increase in Lithium dose. She is on diazepam 5mg  3 tablets daily for anxiety. Follow-up in 6 months, she knows to call for any changes.   Follow Up Instructions:   -I discussed the assessment and treatment plan with the patient. The patient was provided an opportunity to ask questions and all were answered. The patient agreed with the plan and demonstrated an understanding of the instructions.   The patient was advised to call back or seek an in-person evaluation if the symptoms worsen or if the condition fails to improve as anticipated.     Cameron Sprang, MD

## 2018-08-20 NOTE — Telephone Encounter (Signed)
Pt is calling to let you know that she had to move her MRI due to ride her father would not take her and she is now sch for 09-22-18

## 2018-08-20 NOTE — Telephone Encounter (Signed)
[facility] [place], spoke with Butch Penny.  Pt told Dr. Delice Lesch she was unable to get Aimovig, that [facility]. Per Butch Penny, it needs a new PA. Will initiate.

## 2018-08-24 ENCOUNTER — Encounter: Payer: Self-pay | Admitting: Neurology

## 2018-08-24 ENCOUNTER — Telehealth: Payer: Self-pay | Admitting: *Deleted

## 2018-08-24 DIAGNOSIS — T8189XA Other complications of procedures, not elsewhere classified, initial encounter: Secondary | ICD-10-CM | POA: Diagnosis not present

## 2018-08-24 DIAGNOSIS — I10 Essential (primary) hypertension: Secondary | ICD-10-CM | POA: Diagnosis not present

## 2018-08-24 DIAGNOSIS — L02211 Cutaneous abscess of abdominal wall: Secondary | ICD-10-CM | POA: Diagnosis not present

## 2018-08-24 NOTE — Telephone Encounter (Signed)
Thank you :)

## 2018-08-24 NOTE — Telephone Encounter (Signed)
Regarding earlier phone documentation today - patient's MRI sedation has now been moved to 9/8 at 1000 (patient to arrive at [facility] hospital admitting at 0800 - NPO after MN, wear a mask, have a driver). Her COVID testing has been moved to Thursday 9/3 at 1015 drive through [facility] Patient made aware of both of these time/date changes and was able to repeat back to me the new times/dates.  Her 8/25 MD virtual appt with Dr. Delice Lesch is not needed as her MRI is moved up a week therefore the 8/13 Dr. Delice Lesch visit falls within the 30 days for anesthesia. Patient informed reason this appt is being cancelled and she verbalized understanding.

## 2018-08-24 NOTE — Telephone Encounter (Signed)
Spoke to [place] (MRI scheduling) and verified that they received office notes I just faxed from 8/13 MD appt. They have received it. Everything is arranged for upcoming MRI with sedation (see prior notes).

## 2018-08-24 NOTE — Telephone Encounter (Signed)
Patient had MD virtual appt on 8/13 as required before MRI with sedation. MRI with sedation was scheduled for 8/27 but patient called back to let our office know that she was not able to secure a ride that day so she rescheduled MRI sedation for 9/15.  Called and spoke with Elberta Fortis in Centralized Scheduling [phone]) to make sure that the time between the MD visit (8/13) and the new MRI sedation appt (9/15) is within the time limit required by anesthesia. He directed me to call Lanette in Radiology [phone]) who was going to check and find out.  Lanette stated she checked with anesthesia and MD visit DOES need to be within 30 days.   Spoke to patient and explained all above to her - she is calling scheduling office to request a move MRI to an earlier date.  Office notes will be faxed over from 8/13 virtual appt once new MRI date confirmed.  MD - patient called back soon after office visit with you 8/13 to report her BP was 135/90.

## 2018-08-25 ENCOUNTER — Encounter: Payer: Self-pay | Admitting: Pain Medicine

## 2018-08-25 ENCOUNTER — Other Ambulatory Visit: Payer: Self-pay

## 2018-08-25 ENCOUNTER — Telehealth: Payer: Self-pay

## 2018-08-25 ENCOUNTER — Ambulatory Visit: Payer: Medicare Other | Attending: Pain Medicine | Admitting: Pain Medicine

## 2018-08-25 ENCOUNTER — Ambulatory Visit (INDEPENDENT_AMBULATORY_CARE_PROVIDER_SITE_OTHER): Payer: Medicare Other | Admitting: Psychology

## 2018-08-25 VITALS — BP 135/106 | HR 82 | Temp 98.1°F | Resp 18 | Ht 65.0 in | Wt 260.0 lb

## 2018-08-25 DIAGNOSIS — G43611 Persistent migraine aura with cerebral infarction, intractable, with status migrainosus: Secondary | ICD-10-CM

## 2018-08-25 DIAGNOSIS — G8918 Other acute postprocedural pain: Secondary | ICD-10-CM | POA: Diagnosis not present

## 2018-08-25 DIAGNOSIS — F319 Bipolar disorder, unspecified: Secondary | ICD-10-CM | POA: Diagnosis not present

## 2018-08-25 DIAGNOSIS — F909 Attention-deficit hyperactivity disorder, unspecified type: Secondary | ICD-10-CM

## 2018-08-25 DIAGNOSIS — M5441 Lumbago with sciatica, right side: Secondary | ICD-10-CM | POA: Insufficient documentation

## 2018-08-25 DIAGNOSIS — M47816 Spondylosis without myelopathy or radiculopathy, lumbar region: Secondary | ICD-10-CM | POA: Diagnosis not present

## 2018-08-25 DIAGNOSIS — I639 Cerebral infarction, unspecified: Secondary | ICD-10-CM | POA: Diagnosis not present

## 2018-08-25 DIAGNOSIS — R55 Syncope and collapse: Secondary | ICD-10-CM | POA: Insufficient documentation

## 2018-08-25 DIAGNOSIS — M5442 Lumbago with sciatica, left side: Secondary | ICD-10-CM

## 2018-08-25 DIAGNOSIS — M47817 Spondylosis without myelopathy or radiculopathy, lumbosacral region: Secondary | ICD-10-CM | POA: Insufficient documentation

## 2018-08-25 DIAGNOSIS — F410 Panic disorder [episodic paroxysmal anxiety] without agoraphobia: Secondary | ICD-10-CM

## 2018-08-25 DIAGNOSIS — M5136 Other intervertebral disc degeneration, lumbar region: Secondary | ICD-10-CM | POA: Insufficient documentation

## 2018-08-25 DIAGNOSIS — F4310 Post-traumatic stress disorder, unspecified: Secondary | ICD-10-CM | POA: Diagnosis not present

## 2018-08-25 DIAGNOSIS — G8929 Other chronic pain: Secondary | ICD-10-CM | POA: Insufficient documentation

## 2018-08-25 NOTE — Progress Notes (Addendum)
Safety precautions to be maintained throughout the outpatient stay will include: orient to surroundings, keep bed in low position, maintain call bell within reach at all times, provide assistance with transfer out of bed and ambulation. Procedure was not performed due to the fact that patient has a small abdominal packing in place from an old incision.

## 2018-08-25 NOTE — Progress Notes (Signed)
Patient's Name: Emily [name]  MRN: [id number]  Referring Provider: Milinda Pointer, MD  DOB: 1980/06/28  PCP: Monico Blitz, MD  DOS: 08/25/2018  Note by: Gaspar Cola, MD  Service setting: Ambulatory outpatient  Specialty: Interventional Pain Management  Patient type: Established  Location: [facility] (AMB) Pain Management Facility  Visit type: Interventional Procedure   Primary Reason for Visit: Interventional Pain Management Treatment. CC: Back Pain (low left)  Initial Vital Signs:  Pulse/HCG Rate: 82  Temp: 98.1 F (36.7 C) Resp: 18 BP: (!) 135/106 SpO2: 100 %  BMI: Estimated body mass index is 43.27 kg/m as calculated from the following:   Height as of this encounter: 5\' 5"  (1.651 m).   Weight as of this encounter: 260 lb (117.9 kg).  The patient comes into the clinic today scheduled for a lumbar facet block/possible radiofrequency ablation.  However, upon arriving to the clinic, she turns out to have an open abdominal wound, packed.  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  Furthermore, vitals today show her to have a diastolic pressure above 195 and therefore, because this is an elective surgery, we have chosen to cancel it and postpone it towards the future once she has completely healed from this problem.  Previously we had mentioned to the patient that all procedures that we do are elective and that the use of steroids will decrease the immune system and possibly will also slow down healing for about 2 weeks after using them.  Because of this, it is our policy not to do any steroid injections when there are any active infections.  Plan of Care  Orders:  No orders of the defined types were placed in this encounter.  Chronic Opioid Analgesic:  None from our practice.    Medications ordered for procedure: No orders of the defined types were placed in this encounter.  Medications administered: Dharma L. Lofaro had no medications administered during this visit.  See the medical record for exact dosing, route, and time of administration.  Follow-up plan:   Return for after she has fully healed from her abdominal wound infection..       Interventional management options: Considering:   Diagnosticmidline thoracic ESI Diagnosticbilateral thoracic facet block  Possiblebilateral thoracic RFA Diagnostic right sided LESI Therapeutic bilateral lumbar facet RFA #1  Diagnosticleft IA knee injection Diagnosticleft knee genicular NB Possibleleft genicular nerve RFA  Diagnosticright-sided CESI Diagnosticright-sided cervical facet block Diagnosticright-sided cervical facet RFA Diagnosticright occipital NB   Palliative PRN treatment(s):   Palliative bilateral lumbar facet block #3      Recent Visits Date Type Provider Dept  08/11/18 Office Visit Milinda Pointer, MD [facility]  07/09/18 Procedure visit Milinda Pointer, MD [facility]  06/24/18 Office Visit Milinda Pointer, MD [facility]  Showing recent visits within past 90 days and meeting all other requirements   Today's Visits Date Type Provider Dept  08/25/18 Procedure visit Milinda Pointer, MD [facility]  Showing today's visits and meeting all other requirements   Future Appointments Date Type Provider Dept  09/21/18 Appointment Milinda Pointer, MD [facility]  Showing future appointments within next 90 days and meeting all other requirements   Disposition: Discharge home  Discharge Date & Time: 08/25/2018  Primary Care Physician: Manuella Ghazi,  Weldon Picking, MD Location: [facility] Note by: Gaspar Cola, MD Date: 08/25/2018; Time: 11:26 AM  Disclaimer:  Medicine is not an Armed forces logistics/support/administrative officer. The only guarantee in medicine is that nothing is guaranteed. It is important to note that the decision to proceed with this intervention was based on the information collected from the patient. The Data and conclusions were drawn from the patient's questionnaire, the interview, and the physical examination. Because the information was provided in large part by the patient, it cannot be guaranteed that it has not been purposely or unconsciously manipulated. Every effort has been made to obtain as much relevant data as possible for this evaluation. It is important to note that the conclusions that lead to this procedure are derived in large part from the available data. Always take into account that the treatment will also be dependent on availability of resources and existing treatment guidelines, considered by other Pain Management Practitioners as being common knowledge and practice, at the time of the intervention. For Medico-Legal purposes, it is also important to point out that variation in procedural techniques and pharmacological choices are the acceptable norm. The indications, contraindications, technique, and results of the above procedure should only be interpreted and judged by a Board-Certified Interventional Pain Specialist with extensive familiarity and expertise in the same exact procedure and technique.

## 2018-08-25 NOTE — Telephone Encounter (Signed)
Called patient and answered questions she had regarding RF

## 2018-08-25 NOTE — Telephone Encounter (Signed)
She wants a nurse to call her and give her the conditions of having a RFA. Like what she cant have going on etc.

## 2018-08-25 NOTE — Patient Instructions (Signed)

## 2018-08-28 DIAGNOSIS — Z4801 Encounter for change or removal of surgical wound dressing: Secondary | ICD-10-CM | POA: Diagnosis not present

## 2018-08-28 DIAGNOSIS — L02211 Cutaneous abscess of abdominal wall: Secondary | ICD-10-CM | POA: Diagnosis not present

## 2018-08-31 ENCOUNTER — Other Ambulatory Visit (HOSPITAL_COMMUNITY): Payer: Medicare Other

## 2018-08-31 DIAGNOSIS — Z886 Allergy status to analgesic agent status: Secondary | ICD-10-CM | POA: Diagnosis not present

## 2018-08-31 DIAGNOSIS — Z87891 Personal history of nicotine dependence: Secondary | ICD-10-CM | POA: Diagnosis not present

## 2018-08-31 DIAGNOSIS — M79605 Pain in left leg: Secondary | ICD-10-CM | POA: Diagnosis not present

## 2018-08-31 DIAGNOSIS — T8189XA Other complications of procedures, not elsewhere classified, initial encounter: Secondary | ICD-10-CM | POA: Diagnosis not present

## 2018-08-31 DIAGNOSIS — G35 Multiple sclerosis: Secondary | ICD-10-CM | POA: Diagnosis not present

## 2018-08-31 DIAGNOSIS — I10 Essential (primary) hypertension: Secondary | ICD-10-CM | POA: Diagnosis not present

## 2018-08-31 DIAGNOSIS — M545 Low back pain: Secondary | ICD-10-CM | POA: Diagnosis not present

## 2018-08-31 DIAGNOSIS — G8929 Other chronic pain: Secondary | ICD-10-CM | POA: Diagnosis not present

## 2018-08-31 DIAGNOSIS — Z79899 Other long term (current) drug therapy: Secondary | ICD-10-CM | POA: Diagnosis not present

## 2018-08-31 DIAGNOSIS — F329 Major depressive disorder, single episode, unspecified: Secondary | ICD-10-CM | POA: Diagnosis not present

## 2018-08-31 DIAGNOSIS — M4726 Other spondylosis with radiculopathy, lumbar region: Secondary | ICD-10-CM | POA: Diagnosis not present

## 2018-08-31 DIAGNOSIS — E876 Hypokalemia: Secondary | ICD-10-CM | POA: Diagnosis not present

## 2018-08-31 DIAGNOSIS — Z885 Allergy status to narcotic agent status: Secondary | ICD-10-CM | POA: Diagnosis not present

## 2018-08-31 DIAGNOSIS — Z888 Allergy status to other drugs, medicaments and biological substances status: Secondary | ICD-10-CM | POA: Diagnosis not present

## 2018-08-31 DIAGNOSIS — F419 Anxiety disorder, unspecified: Secondary | ICD-10-CM | POA: Diagnosis not present

## 2018-08-31 DIAGNOSIS — R52 Pain, unspecified: Secondary | ICD-10-CM | POA: Diagnosis not present

## 2018-08-31 DIAGNOSIS — M4697 Unspecified inflammatory spondylopathy, lumbosacral region: Secondary | ICD-10-CM | POA: Diagnosis not present

## 2018-08-31 DIAGNOSIS — M5117 Intervertebral disc disorders with radiculopathy, lumbosacral region: Secondary | ICD-10-CM | POA: Diagnosis not present

## 2018-08-31 DIAGNOSIS — L02211 Cutaneous abscess of abdominal wall: Secondary | ICD-10-CM | POA: Diagnosis not present

## 2018-09-01 ENCOUNTER — Telehealth: Payer: Self-pay | Admitting: Pain Medicine

## 2018-09-01 ENCOUNTER — Ambulatory Visit: Payer: Medicare Other | Admitting: Neurology

## 2018-09-01 NOTE — Telephone Encounter (Signed)
Had sudden onset of lower back pain, going down the leg. Did CT, no new diagnosis. Went to ED, was given Fentanyl IV, instructed to follow-up with Pain Management. Feeling better now, feels "stable". Instructed to rest, apply heat, and call next week if needed.

## 2018-09-01 NOTE — Telephone Encounter (Signed)
Patient Emily [name] 08-31-18 at 3:48 stating she was in a lot of pain and went to ED and received some medicine and a CT scan and told her to call here for further tx. Please call patient to verify what meds were given, she did not leave information on what she got.

## 2018-09-02 DIAGNOSIS — Z713 Dietary counseling and surveillance: Secondary | ICD-10-CM | POA: Diagnosis not present

## 2018-09-02 DIAGNOSIS — Z6841 Body Mass Index (BMI) 40.0 and over, adult: Secondary | ICD-10-CM | POA: Diagnosis not present

## 2018-09-02 DIAGNOSIS — Z299 Encounter for prophylactic measures, unspecified: Secondary | ICD-10-CM | POA: Diagnosis not present

## 2018-09-02 DIAGNOSIS — M549 Dorsalgia, unspecified: Secondary | ICD-10-CM | POA: Diagnosis not present

## 2018-09-02 DIAGNOSIS — I10 Essential (primary) hypertension: Secondary | ICD-10-CM | POA: Diagnosis not present

## 2018-09-03 ENCOUNTER — Ambulatory Visit (HOSPITAL_COMMUNITY): Payer: Medicare Other

## 2018-09-08 DIAGNOSIS — F329 Major depressive disorder, single episode, unspecified: Secondary | ICD-10-CM | POA: Diagnosis not present

## 2018-09-08 DIAGNOSIS — Z87891 Personal history of nicotine dependence: Secondary | ICD-10-CM | POA: Diagnosis not present

## 2018-09-08 DIAGNOSIS — Z886 Allergy status to analgesic agent status: Secondary | ICD-10-CM | POA: Diagnosis not present

## 2018-09-08 DIAGNOSIS — Z885 Allergy status to narcotic agent status: Secondary | ICD-10-CM | POA: Diagnosis not present

## 2018-09-08 DIAGNOSIS — G4089 Other seizures: Secondary | ICD-10-CM | POA: Diagnosis not present

## 2018-09-08 DIAGNOSIS — M25512 Pain in left shoulder: Secondary | ICD-10-CM | POA: Diagnosis not present

## 2018-09-08 DIAGNOSIS — S0093XA Contusion of unspecified part of head, initial encounter: Secondary | ICD-10-CM | POA: Diagnosis not present

## 2018-09-08 DIAGNOSIS — I10 Essential (primary) hypertension: Secondary | ICD-10-CM | POA: Diagnosis not present

## 2018-09-08 DIAGNOSIS — W19XXXA Unspecified fall, initial encounter: Secondary | ICD-10-CM | POA: Diagnosis not present

## 2018-09-08 DIAGNOSIS — R569 Unspecified convulsions: Secondary | ICD-10-CM | POA: Diagnosis not present

## 2018-09-08 DIAGNOSIS — F419 Anxiety disorder, unspecified: Secondary | ICD-10-CM | POA: Diagnosis not present

## 2018-09-08 DIAGNOSIS — S161XXA Strain of muscle, fascia and tendon at neck level, initial encounter: Secondary | ICD-10-CM | POA: Diagnosis not present

## 2018-09-08 DIAGNOSIS — R52 Pain, unspecified: Secondary | ICD-10-CM | POA: Diagnosis not present

## 2018-09-08 DIAGNOSIS — G35 Multiple sclerosis: Secondary | ICD-10-CM | POA: Diagnosis not present

## 2018-09-08 DIAGNOSIS — F445 Conversion disorder with seizures or convulsions: Secondary | ICD-10-CM | POA: Diagnosis not present

## 2018-09-08 DIAGNOSIS — S199XXA Unspecified injury of neck, initial encounter: Secondary | ICD-10-CM | POA: Diagnosis not present

## 2018-09-08 DIAGNOSIS — S4992XA Unspecified injury of left shoulder and upper arm, initial encounter: Secondary | ICD-10-CM | POA: Diagnosis not present

## 2018-09-08 DIAGNOSIS — Z79899 Other long term (current) drug therapy: Secondary | ICD-10-CM | POA: Diagnosis not present

## 2018-09-08 DIAGNOSIS — S0990XA Unspecified injury of head, initial encounter: Secondary | ICD-10-CM | POA: Diagnosis not present

## 2018-09-08 DIAGNOSIS — R202 Paresthesia of skin: Secondary | ICD-10-CM | POA: Diagnosis not present

## 2018-09-08 DIAGNOSIS — Z888 Allergy status to other drugs, medicaments and biological substances status: Secondary | ICD-10-CM | POA: Diagnosis not present

## 2018-09-08 NOTE — Progress Notes (Signed)
Emily [name] (Key: [id number]) Rx #: [id number] Aimovig 140MG /ML auto-injectors   Form [employer] Electronic PA Form Created 19 days ago Sent to Plan less than a minute ago Plan Response less than a minute ago Submit Clinical Questions Determination N/A Message from [facility] already on file for this request.

## 2018-09-09 ENCOUNTER — Telehealth: Payer: Self-pay

## 2018-09-09 ENCOUNTER — Telehealth (HOSPITAL_COMMUNITY): Payer: Self-pay

## 2018-09-09 NOTE — Telephone Encounter (Signed)
Patient called and said that she went to the ED for seizures and they gave her Haldol, patient states that it works great and wants to know if you can prescribe.

## 2018-09-09 NOTE — Telephone Encounter (Signed)
She wants to schedule her RFA now and said her wound is all healed up. She wants it asap and Dr. Dossie Arbour doesn't have anything for several weeks. She wants to know if someone else will do it for her asap.

## 2018-09-09 NOTE — Telephone Encounter (Signed)
Does Dr Holley Raring have any sooner appointments available before we even ask if its ok?

## 2018-09-10 ENCOUNTER — Encounter: Payer: Self-pay | Admitting: Dietician

## 2018-09-10 ENCOUNTER — Ambulatory Visit (HOSPITAL_COMMUNITY): Payer: Medicare Other

## 2018-09-10 ENCOUNTER — Telehealth: Payer: Self-pay | Admitting: Neurology

## 2018-09-10 DIAGNOSIS — Z79899 Other long term (current) drug therapy: Secondary | ICD-10-CM | POA: Diagnosis not present

## 2018-09-10 DIAGNOSIS — Z87891 Personal history of nicotine dependence: Secondary | ICD-10-CM | POA: Diagnosis not present

## 2018-09-10 DIAGNOSIS — M5442 Lumbago with sciatica, left side: Secondary | ICD-10-CM | POA: Diagnosis not present

## 2018-09-10 DIAGNOSIS — F329 Major depressive disorder, single episode, unspecified: Secondary | ICD-10-CM | POA: Diagnosis not present

## 2018-09-10 DIAGNOSIS — T8189XA Other complications of procedures, not elsewhere classified, initial encounter: Secondary | ICD-10-CM | POA: Diagnosis not present

## 2018-09-10 DIAGNOSIS — G8929 Other chronic pain: Secondary | ICD-10-CM | POA: Diagnosis not present

## 2018-09-10 DIAGNOSIS — G35 Multiple sclerosis: Secondary | ICD-10-CM | POA: Diagnosis not present

## 2018-09-10 DIAGNOSIS — I10 Essential (primary) hypertension: Secondary | ICD-10-CM | POA: Diagnosis not present

## 2018-09-10 DIAGNOSIS — L02211 Cutaneous abscess of abdominal wall: Secondary | ICD-10-CM | POA: Diagnosis not present

## 2018-09-10 DIAGNOSIS — F419 Anxiety disorder, unspecified: Secondary | ICD-10-CM | POA: Diagnosis not present

## 2018-09-10 NOTE — Telephone Encounter (Addendum)
10/05/18 first available for Dr. Holley Raring which is only one week before Dr. Dossie Arbour could do it. I will call her and let her know and keep it with Dr. Dossie Arbour.

## 2018-09-10 NOTE — Progress Notes (Signed)
Anesthesia Chart Review: Emily [name]  Case: [id number] Date/Time: 09/15/18 0945   Procedure: MRI OF BRAIN WITH AND WITHOUT CONTRAST (N/A )   Anesthesia type: General   Pre-op diagnosis: MULTIPLE SCLEROSIS   Location: [facility] OR RADIOLOGY ROOM / [facility]   Surgeon: Radiologist, Medication, MD      DISCUSSION: Patient is a 38 year old female scheduled for MRI of the brain under anesthesia (due to significant claustrophobia). H&P/office note completed by Ellouise Newer, MD on 08/20/18.   History includes former smoker (quit 07/12/15), childhood asthma, GERD, chronic back pain, Conversion disorder, pseudoseizures, HTN, Multiple Sclerosis (diagnosed 2008), PTSD, anxiety with panic attacks, depression, fatty liver, vertigo, migraines, neuropathy, perforated bowel (s/p surgery/temporary colostomy 2009), ventral hernia repair (02/16/16). Anorectal exam under anesthesia 06/11/18, MRI under anesthesia 03/06/17.   Anesthesia team and to evaluate on the day of her procedure. She will need urine pregnancy test and any other labs per anesthesia at that time.     VS:  For day of procedure.  PROVIDERS: Monico Blitz, MD is PCP Ellouise Newer, MD is neurologist   LABS: As of 08/13/18, labs results include: Lab Results  Component Value Date   WBC 7.1 06/08/2018   HGB 13.7 06/08/2018   HCT 42.1 06/08/2018   PLT 383 06/08/2018   GLUCOSE 117 (H) 08/13/2018   ALT 17 08/13/2018   AST 25 08/13/2018   NA 139 08/13/2018   K 4.1 08/13/2018   CL 101 08/13/2018   CREATININE 1.06 (H) 08/13/2018   BUN 13 08/13/2018   CO2 22 08/13/2018   TSH 1.430 08/13/2018   INR 1.0 06/08/2018     EKG: 06/08/18: Normal sinus rhythm Cannot rule out Anterior infarct , age undetermined Abnormal ECG Improved R wave in V4 since 2017 Confirmed by Jenkins Rouge [phone]) on 06/08/2018 12:12:48 PM   CV: N/A   Past Medical History:  Diagnosis Date  . Anxiety   . Asthma as a child  . Chronic back pain   . Chronic pain   . Conversion  disorder   . DDD (degenerative disc disease), cervical   . Depression   . External hemorrhoid   . Fatty liver   . GERD (gastroesophageal reflux disease)   . History of kidney stones   . HTN (hypertension) 02/27/2015  . JC virus antibody positive   . Migraines   . MS (multiple sclerosis) ([place])    06-2006  . Neuropathy   . OA (osteoarthritis)   . Ovarian cyst   . Panic attack   . Perforated bowel ([place]) 2009   colostomy bag for 3 motnhs  . 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 PTSD (post-traumatic stress disorder)   . S/P emergency C-section   . Seasonal allergies   . Urinary urgency   . Vertigo   . Wears contact lenses   . Wears glasses     Past Surgical History:  Procedure Laterality Date  . ABDOMINAL SURGERY    . APPENDECTOMY    . BOWEL RESECTION  01/2007   with colostomy  . CESAREAN SECTION N/A 07/12/2015   Procedure: CESAREAN SECTION;  Surgeon: Guss Bunde, MD;  Location: [place];  Service: Obstetrics;  Laterality: N/A;  . COLONOSCOPY WITH PROPOFOL N/A 01/29/2017   Procedure: COLONOSCOPY WITH PROPOFOL;  Surgeon: Ileana Roup, MD;  Location: [facility];  Service: General;  Laterality: N/A;  . COLOSTOMY CLOSURE  04/2007  . EVALUATION UNDER ANESTHESIA WITH HEMORRHOIDECTOMY N/A  06/11/2018   Procedure: ANORECTAL EXAM UNDER ANESTHESIA WITH HEMORRHOIDECTOMY;  Surgeon: Ileana Roup, MD;  Location: [place];  Service: General;  Laterality: N/A;  . EXCISIONAL HEMORRHOIDECTOMY    . EXTREMITY CYST EXCISION  1994   right leg  . HERNIA REPAIR    . INCISIONAL HERNIA REPAIR N/A 02/16/2016   Procedure: HERNIA REPAIR INCISIONAL;  Surgeon: Fanny Skates, MD;  Location: [facility];  Service: General;  Laterality: N/A;  . INSERTION OF MESH  02/16/2016   Procedure: INSERTION OF MESH;  Surgeon: Fanny Skates, MD;  Location: [facility];  Service: General;;  . RADIOLOGY WITH ANESTHESIA N/A 03/06/2017   Procedure: MRI WITH  ANESTHESIA OF CERVICAL SPINE WITHOUT CONTRAST, MRI OF LUMBAR SPINE WITHOUT CONTRAST;  Surgeon: Radiologist, Medication, MD;  Location: [place];  Service: Radiology;  Laterality: N/A;  . SCAR REVISION  01/21/2011   Procedure: SCAR REVISION;  Surgeon: Hermelinda Dellen;  Location: [place];  Service: Plastics;  Laterality: N/A;  exploration of scar of abdomen and repair of defect  . TOOTH EXTRACTION Left 10/2016  . TRANSRECTAL DRAINAGE OF PELVIC ABSCESS      MEDICATIONS: No current facility-administered medications for this encounter.    Marland Kitchen AIMOVIG 140 MG/ML SOAJ  . albuterol (PROVENTIL HFA;VENTOLIN HFA) 108 (90 Base) MCG/ACT inhaler  . amLODipine (NORVASC) 5 MG tablet  . busPIRone (BUSPAR) 15 MG tablet  . diazepam (VALIUM) 5 MG tablet  . divalproex (DEPAKOTE) 500 MG DR tablet  . dronabinol (MARINOL) 2.5 MG capsule  . EPINEPHrine (EPIPEN) 0.3 mg/0.3 mL SOAJ injection  . etonogestrel (NEXPLANON) 68 MG IMPL implant  . Fingolimod HCl (GILENYA) 0.5 MG CAPS  . FLUoxetine (PROZAC) 40 MG capsule  . lithium carbonate (LITHOBID) 300 MG CR tablet  . loratadine (CLARITIN) 10 MG tablet  . methocarbamol (ROBAXIN) 750 MG tablet  . Multiple Vitamins-Minerals (MULTIVITAMIN ADULT PO)  . NON FORMULARY  . omeprazole (PRILOSEC) 20 MG capsule  . pregabalin (LYRICA) 100 MG capsule  . topiramate (TOPAMAX) 100 MG tablet   . metoCLOPramide (REGLAN) injection 10 mg    Myra Gianotti, PA-C Surgical Short Stay/Anesthesiology [facility] Phone [phone] [facility] Phone [phone] 09/10/2018 4:43 PM

## 2018-09-10 NOTE — Telephone Encounter (Signed)
I spoke with patient and I asked Dr. Holley Raring( per her request) and  he declined to do the RFA any earlier than her scheduled appt with Dr. Barnett Applebaum on 10/06 for RFA. Patient asked if she could be prescribed any medications for pain and I reminded her that Dr. Dossie Arbour only does injections for her and no medications are going to be prescribed. She then asked me if she could get a BLF earlier than the RFA. I again explained that you should not do the BLF and the RFA close together, but she insisted that I ask Dr. Dossie Arbour if he would do the BLF. She did not to mind that the RFA would be postponed if Dr. Dossie Arbour went ahead with the BLF. Dr. Dossie Arbour please advise on this.  Thank you- Anderson Malta

## 2018-09-10 NOTE — Telephone Encounter (Signed)
The patient called back and asked if anyone was going to call her today. She states she is very nervous and scared and wants to speak to someone. I told her that someone would call her as soon as they had a chance........Marland Kitchen

## 2018-09-10 NOTE — Telephone Encounter (Signed)
I called patient back and told her that Dr. Doyne Keel will discuss at her visit on 09/17/18

## 2018-09-10 NOTE — Telephone Encounter (Signed)
We can discuss at her next scheduled appt

## 2018-09-10 NOTE — Anesthesia Preprocedure Evaluation (Addendum)
Anesthesia Evaluation  Patient identified by MRN, date of birth, ID band Patient awake    Reviewed: Allergy & Precautions, NPO status , Patient's Chart, lab work & pertinent test results  Airway Mallampati: II  TM Distance: >3 FB Neck ROM: Full    Dental  (+) Loose, Poor Dentition   Pulmonary asthma , former smoker,    Pulmonary exam normal breath sounds clear to auscultation       Cardiovascular hypertension, Pt. 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 arthritis   Abdominal (+) + obese,   Peds  Hematology negative hematology ROS (+)   Anesthesia Other Findings   Reproductive/Obstetrics                           Anesthesia Physical Anesthesia Plan  ASA: III  Anesthesia Plan: General   Post-op Pain Management:    Induction: Intravenous  PONV Risk Score and Plan: 3 and Midazolam, Ondansetron and Treatment may vary due to age or medical condition  Airway Management Planned: LMA  Additional Equipment:   Intra-op Plan:   Post-operative Plan: Extubation in OR  Informed Consent: I have reviewed the patients History and Physical, chart, labs and discussed the procedure including the risks, benefits and alternatives for the proposed anesthesia with the patient or authorized representative who has indicated his/her understanding and acceptance.     Dental  advisory given  Plan Discussed with: CRNA and Surgeon  Anesthesia Plan Comments: (PAT note written by Myra Gianotti, PA-C. SAME DAY WORK-UP   )       Anesthesia Quick Evaluation

## 2018-09-10 NOTE — Telephone Encounter (Signed)
Patient called and said Dr. Dossie Arbour did not do her surgery yet and will be out of the office for the next two weeks. She is experiencing back pain and seizures and would like a call back for advice.

## 2018-09-10 NOTE — Progress Notes (Signed)
Have not heard back from patient to reschedule her missed appointment from 08/19/18, which was rescheduled from 07/23/18. Sent letter to referring provider.

## 2018-09-10 NOTE — Telephone Encounter (Signed)
I scheduled her with Dr. Dossie Arbour for 10/13/18.Marland Kitchen She wants to know what she can do in the meantime for the pain. She states she will be going in and out of the hospital since she has to wait that long. She is taking the medicine and using heat and cold. Is there any other recommendations?

## 2018-09-10 NOTE — Telephone Encounter (Signed)
She called back again and wants to know if she should go to the ER. I told her to give yall a few more minutes.......Marland Kitchen

## 2018-09-11 ENCOUNTER — Other Ambulatory Visit (HOSPITAL_COMMUNITY)
Admission: RE | Admit: 2018-09-11 | Discharge: 2018-09-11 | Disposition: A | Discharge status: Home / Self Care | Payer: Medicare Other | Source: Ambulatory Visit | Attending: Neurology | Admitting: Neurology

## 2018-09-11 DIAGNOSIS — S0093XA Contusion of unspecified part of head, initial encounter: Secondary | ICD-10-CM | POA: Diagnosis not present

## 2018-09-11 DIAGNOSIS — W01198A Fall on same level from slipping, tripping and stumbling with subsequent striking against other object, initial encounter: Secondary | ICD-10-CM | POA: Diagnosis not present

## 2018-09-11 DIAGNOSIS — Z6841 Body Mass Index (BMI) 40.0 and over, adult: Secondary | ICD-10-CM | POA: Diagnosis not present

## 2018-09-11 DIAGNOSIS — S40011A Contusion of right shoulder, initial encounter: Secondary | ICD-10-CM | POA: Diagnosis not present

## 2018-09-11 DIAGNOSIS — Z20828 Contact with and (suspected) exposure to other viral communicable diseases: Secondary | ICD-10-CM | POA: Diagnosis not present

## 2018-09-11 DIAGNOSIS — G35 Multiple sclerosis: Secondary | ICD-10-CM | POA: Diagnosis not present

## 2018-09-11 DIAGNOSIS — Z79899 Other long term (current) drug therapy: Secondary | ICD-10-CM | POA: Diagnosis not present

## 2018-09-11 DIAGNOSIS — G8929 Other chronic pain: Secondary | ICD-10-CM | POA: Diagnosis not present

## 2018-09-11 DIAGNOSIS — Z87891 Personal history of nicotine dependence: Secondary | ICD-10-CM | POA: Diagnosis not present

## 2018-09-11 DIAGNOSIS — M25511 Pain in right shoulder: Secondary | ICD-10-CM | POA: Diagnosis not present

## 2018-09-11 DIAGNOSIS — Z299 Encounter for prophylactic measures, unspecified: Secondary | ICD-10-CM | POA: Diagnosis not present

## 2018-09-11 DIAGNOSIS — W19XXXA Unspecified fall, initial encounter: Secondary | ICD-10-CM | POA: Diagnosis not present

## 2018-09-11 DIAGNOSIS — I10 Essential (primary) hypertension: Secondary | ICD-10-CM | POA: Diagnosis not present

## 2018-09-11 DIAGNOSIS — M549 Dorsalgia, unspecified: Secondary | ICD-10-CM | POA: Diagnosis not present

## 2018-09-11 DIAGNOSIS — Z01812 Encounter for preprocedural laboratory examination: Secondary | ICD-10-CM | POA: Insufficient documentation

## 2018-09-11 DIAGNOSIS — S0990XA Unspecified injury of head, initial encounter: Secondary | ICD-10-CM | POA: Diagnosis not present

## 2018-09-11 NOTE — Telephone Encounter (Signed)
Please advise on any suggestions send to pool for reply, thanks

## 2018-09-11 NOTE — Telephone Encounter (Signed)
Patient notified

## 2018-09-11 NOTE — Progress Notes (Signed)
Several unsuccessful attempts were made to contact pt. Pt mother stated that pt had a fall today and was in the ED. Please complete assessment on DOS.  Pre-op instructions were given to pt mother, [name]). Mother made aware to have pt stop taking vitamins, herbal supplement and CBD Gummy. Mother verbalized understanding of all pre-op instructions. See, PA,  Anesthesiology, note.

## 2018-09-11 NOTE — Telephone Encounter (Signed)
Pls let her know that unfortunately for the pain, it still has to be through their office, if he is not there, for her to speak to covering doctor. For the seizures, since they are stress-related, pls talk to her psychiatrist about the Ativan. Thanks

## 2018-09-13 ENCOUNTER — Emergency Department (HOSPITAL_COMMUNITY)
Admission: EM | Admit: 2018-09-13 | Discharge: 2018-09-13 | Disposition: A | Discharge status: Home / Self Care | Payer: Medicare Other | Attending: Emergency Medicine | Admitting: Emergency Medicine

## 2018-09-13 ENCOUNTER — Encounter (HOSPITAL_COMMUNITY): Payer: Self-pay | Admitting: Emergency Medicine

## 2018-09-13 ENCOUNTER — Other Ambulatory Visit: Payer: Self-pay

## 2018-09-13 ENCOUNTER — Emergency Department (HOSPITAL_COMMUNITY): Payer: Medicare Other

## 2018-09-13 DIAGNOSIS — I10 Essential (primary) hypertension: Secondary | ICD-10-CM | POA: Insufficient documentation

## 2018-09-13 DIAGNOSIS — Z87891 Personal history of nicotine dependence: Secondary | ICD-10-CM | POA: Diagnosis not present

## 2018-09-13 DIAGNOSIS — Y92010 Kitchen of single-family (private) house as the place of occurrence of the external cause: Secondary | ICD-10-CM | POA: Diagnosis not present

## 2018-09-13 DIAGNOSIS — Z79899 Other long term (current) drug therapy: Secondary | ICD-10-CM | POA: Insufficient documentation

## 2018-09-13 DIAGNOSIS — M25561 Pain in right knee: Secondary | ICD-10-CM | POA: Diagnosis not present

## 2018-09-13 DIAGNOSIS — S79911A Unspecified injury of right hip, initial encounter: Secondary | ICD-10-CM | POA: Diagnosis not present

## 2018-09-13 DIAGNOSIS — W19XXXA Unspecified fall, initial encounter: Secondary | ICD-10-CM | POA: Insufficient documentation

## 2018-09-13 DIAGNOSIS — M25551 Pain in right hip: Secondary | ICD-10-CM | POA: Insufficient documentation

## 2018-09-13 DIAGNOSIS — Y9389 Activity, other specified: Secondary | ICD-10-CM | POA: Diagnosis not present

## 2018-09-13 DIAGNOSIS — M25511 Pain in right shoulder: Secondary | ICD-10-CM | POA: Insufficient documentation

## 2018-09-13 DIAGNOSIS — J45909 Unspecified asthma, uncomplicated: Secondary | ICD-10-CM | POA: Diagnosis not present

## 2018-09-13 DIAGNOSIS — S199XXA Unspecified injury of neck, initial encounter: Secondary | ICD-10-CM | POA: Diagnosis not present

## 2018-09-13 DIAGNOSIS — R51 Headache: Secondary | ICD-10-CM | POA: Insufficient documentation

## 2018-09-13 DIAGNOSIS — I959 Hypotension, unspecified: Secondary | ICD-10-CM | POA: Diagnosis not present

## 2018-09-13 DIAGNOSIS — S0990XA Unspecified injury of head, initial encounter: Secondary | ICD-10-CM | POA: Diagnosis not present

## 2018-09-13 DIAGNOSIS — Y998 Other external cause status: Secondary | ICD-10-CM | POA: Diagnosis not present

## 2018-09-13 DIAGNOSIS — R296 Repeated falls: Secondary | ICD-10-CM | POA: Diagnosis not present

## 2018-09-13 DIAGNOSIS — M25521 Pain in right elbow: Secondary | ICD-10-CM | POA: Diagnosis not present

## 2018-09-13 LAB — NOVEL CORONAVIRUS, NAA (HOSP ORDER, SEND-OUT TO REF LAB; TAT 18-24 HRS): SARS-CoV-2, NAA: NOT DETECTED

## 2018-09-13 MED ORDER — MIDAZOLAM HCL 2 MG/2ML IJ SOLN
2.0000 mg | Freq: Once | INTRAMUSCULAR | Status: AC
  Administered 2018-09-13: 2 mg via INTRAMUSCULAR
  Filled 2018-09-13: qty 2

## 2018-09-13 MED ORDER — METHOCARBAMOL 1000 MG/10ML IJ SOLN
500.0000 mg | Freq: Once | INTRAMUSCULAR | Status: AC
  Administered 2018-09-13: 500 mg via INTRAMUSCULAR
  Filled 2018-09-13: qty 10
  Filled 2018-09-13: qty 5

## 2018-09-13 MED ORDER — MIDAZOLAM HCL 2 MG/2ML IJ SOLN: 2.0000 mg | Freq: Once | INTRAMUSCULAR | Status: DC

## 2018-09-13 NOTE — ED Notes (Signed)
Patient ambulating in room, gait steady, and speech clear without delay.

## 2018-09-13 NOTE — Discharge Instructions (Addendum)
Continue taking your home medications as prescribed. Follow-up with your primary care doctor as needed for further evaluation. Follow-up for your MRI as scheduled. Return to the emergency room with any new, worsening, concerning symptoms.

## 2018-09-13 NOTE — ED Provider Notes (Addendum)
[facility] EMERGENCY DEPARTMENT Provider Note   CSN: [id number] Arrival date & time: 09/13/18  1109     History   Chief Complaint Chief Complaint  Patient presents with  . Fall    HPI Emily [name] is a 38 y.o. female presenting for evaluation of pain after fall.  Patient states she was in front of the refrigerator when the next and she knows she was on the floor lying on her right side.  She reports pain in her head, neck, right shoulder, right hip, right elbow, and right knee.  Patient states she is also in difficulty getting her words out, which is not normal for her. This has been slowing improving since her fall.  She is ambulatory without assistance (or sometimes with a cane) at baseline.  She has not walked since the incident.  She has not taken anything for pain.  Has not had any of her normal home medicines.  She denies vision changes, chest pain, shortness breath, nausea, chronic abdominal pain, loss of bowel control, numbness, tingling.  She has an MRI scheduled next week with anesthesia, as she has severe claustrophobia.  This is for surveillance of her MS.  She follows with Dr. Delice Lesch from neurology.  Patient also with chronic pain in head, neck, back, extremities.  Follows with pain management for this.  Patient also with a history of pseudoseizures and conversion disorder.  Additional history obtained from chart review.  History of MS, chronic pain, opiate use, depression, pseudoseizures, hypertension, conversion disorder, GAD, history of vasovagal syncope.      HPI  Past Medical History:  Diagnosis Date  . Anxiety   . Asthma as a child  . Chronic back pain   . Chronic pain   . Conversion disorder   . DDD (degenerative disc disease), cervical   . Depression   . External hemorrhoid   . Fatty liver   . GERD (gastroesophageal reflux disease)   . History of kidney stones   . HTN (hypertension) 02/27/2015  . JC virus antibody positive   . Migraines   . MS  (multiple sclerosis) ([place])    06-2006  . Neuropathy   . OA (osteoarthritis)   . Ovarian cyst   . Panic attack   . Perforated bowel ([place]) 2009   colostomy bag for 3 motnhs  . 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 PTSD (post-traumatic stress disorder)   . S/P emergency C-section   . Seasonal allergies   . Urinary urgency   . Vertigo   . Wears contact lenses   . Wears glasses     Patient Active Problem List   Diagnosis Date Noted  . Acute postoperative pain 08/25/2018  . History of substance use disorder 07/19/2018  . Chronic nausea 04/13/2018  . Nausea with vomiting 03/23/2018  . Pharmacologic therapy 03/16/2018  . Disorder of skeletal system 03/16/2018  . Problems influencing health status 03/16/2018  . Abnormal MRI, cervical spine (03/06/17) 03/16/2018  . Morbid obesity with BMI of 45.0-49.9, adult ([place]) 03/16/2018  . Chronic pain of both knees 02/18/2018  . Cervical facet hypertrophy 04/29/2017  . Cervical facet syndrome (Bilateral) (R>L) 04/29/2017  . Spondylosis without myelopathy or radiculopathy, cervical region 04/29/2017  . Spondylosis without myelopathy or radiculopathy, lumbosacral region 03/04/2017  . Cervicogenic headache 02/10/2017  . Occipital headache 02/10/2017  . Trigger point with back pain (Left) 02/10/2017  . History of fainting (vasovagal) 02/10/2017  . History of  vasovagal syncope 02/10/2017  . Acute left-sided low back pain without sciatica 02/10/2017  . Neurogenic pain 01/23/2017  . Chronic musculoskeletal pain 12/02/2016  . Cervical radiculitis (Bilateral) 11/12/2016  . Chronic upper back pain (Primary Area of Pain) (Bilateral) (R>L) 11/01/2016  . Chronic hand pain [facility] Area of Pain) (Bilateral) (L>R) 11/01/2016  . Chronic wrist pain [facility] Area of Pain) (Bilateral) (L>R) 11/01/2016  . Chronic foot/toes pain (Fourth Area of Pain) (Bilateral) (R>L) 11/01/2016  . Chronic upper extremity pain (Secondary Area  of Pain) (Bilateral) (L>R) 11/01/2016  . Long term prescription benzodiazepine use 11/01/2016  . 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 Cervical foraminal stenosis (Bilateral) (C6-7) 11/01/2016  . 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 DDD (degenerative disc disease), thoracic 11/01/2016  . DDD (degenerative disc disease), lumbar 11/01/2016  . Lumbar facet arthropathy 11/01/2016  . Lumbar facet syndrome (Bilateral) (R>L) 11/01/2016  . Vitamin D deficiency 10/28/2016  . Opiate use 07/23/2016  . Chronic neck pain (Primary Area of Pain) (Bilateral) (R>L) 07/23/2016  . Chronic low back pain (Secondary Area of Pain) (Bilateral) (L>R) 07/23/2016  . Chronic knee pain (Fifth Area of Pain) (Left) 07/23/2016  . Midline thoracic back pain 07/23/2016  . Chronic lower extremity pain (Bilateral) 07/23/2016  . Incarcerated incisional hernia 02/16/2016  . Incisional hernia 02/15/2016  . Carpal tunnel syndrome [facility] Area of Pain) (Bilateral) (L>R) 11/13/2015  . Substance abuse ([place]) 09/07/2015  . Paresthesia 08/02/2015  . Chronic pain syndrome 08/02/2015  . Postpartum endometritis 07/21/2015  . PTSD (post-traumatic stress disorder) 05/16/2015  . Conversion disorder with seizures or convulsions 05/16/2015  . Severe episode of recurrent major depressive disorder, without psychotic features ([place]) 05/16/2015  . GAD (generalized anxiety disorder) 05/16/2015  . Difficult intravenous access 05/08/2015  . Right optic neuritis 04/27/2015  . Conversion disorder with attacks or seizures, persistent, with psychological stressor 03/28/2015  . Migraine with aura and without status migrainosus, not intractable 03/28/2015  . Generalized anxiety disorder 03/28/2015  . HTN (hypertension) 02/27/2015  . Perforated bowel ([place]) 02/27/2015  . Depression 12/27/2014  . Multiple sclerosis ([place]) 12/27/2014  . Migraine 12/27/2014  . Seizure disorder ([place]) 12/27/2014  . Smoker  12/27/2014  . Obesity 12/27/2014  . DDD (degenerative disc disease), cervical 07/28/2014  . Major depressive disorder, single episode 11/02/2013  . Laryngopharyngeal reflux 10/25/2010  . Tonsillitis 10/25/2010    Past Surgical History:  Procedure Laterality Date  . ABDOMINAL SURGERY    . APPENDECTOMY    . BOWEL RESECTION  01/2007   with colostomy  . CESAREAN SECTION N/A 07/12/2015   Procedure: CESAREAN SECTION;  Surgeon: Guss Bunde, MD;  Location: [place];  Service: Obstetrics;  Laterality: N/A;  . COLONOSCOPY WITH PROPOFOL N/A 01/29/2017   Procedure: COLONOSCOPY WITH PROPOFOL;  Surgeon: Ileana Roup, MD;  Location: [facility];  Service: General;  Laterality: N/A;  . COLOSTOMY CLOSURE  04/2007  . EVALUATION UNDER ANESTHESIA WITH HEMORRHOIDECTOMY N/A 06/11/2018   Procedure: ANORECTAL EXAM UNDER ANESTHESIA WITH HEMORRHOIDECTOMY;  Surgeon: Ileana Roup, MD;  Location: [place];  Service: General;  Laterality: N/A;  . EXCISIONAL HEMORRHOIDECTOMY    . EXTREMITY CYST EXCISION  1994   right leg  . HERNIA REPAIR    . INCISIONAL HERNIA REPAIR N/A 02/16/2016   Procedure: HERNIA REPAIR INCISIONAL;  Surgeon: Fanny Skates, MD;  Location: [facility];  Service: General;  Laterality: N/A;  . INSERTION OF MESH  02/16/2016   Procedure: INSERTION  OF MESH;  Surgeon: Fanny Skates, MD;  Location: [facility];  Service: General;;  . RADIOLOGY WITH ANESTHESIA N/A 03/06/2017   Procedure: MRI WITH ANESTHESIA OF CERVICAL SPINE WITHOUT CONTRAST, MRI OF LUMBAR SPINE WITHOUT CONTRAST;  Surgeon: Radiologist, Medication, MD;  Location: [place];  Service: Radiology;  Laterality: N/A;  . SCAR REVISION  01/21/2011   Procedure: SCAR REVISION;  Surgeon: Hermelinda Dellen;  Location: [place];  Service: Plastics;  Laterality: N/A;  exploration of scar of abdomen and repair of defect  . TOOTH EXTRACTION Left 10/2016  . TRANSRECTAL DRAINAGE OF PELVIC ABSCESS       OB  History    Gravida  1   Para  1   Term      Preterm  1   AB      Living  1     SAB      TAB      Ectopic      Multiple  0   Live Births  1            Home Medications    Prior to Admission medications   Medication Sig Start Date End Date Taking? Authorizing Provider  AIMOVIG 140 MG/ML SOAJ Inject 140 mg into the muscle every 30 (thirty) days.  08/21/18  Yes [provider]  albuterol (PROVENTIL HFA;VENTOLIN HFA) 108 (90 Base) MCG/ACT inhaler Inhale 2 puffs into the lungs every 6 (six) hours as needed for wheezing or shortness of breath. Patient taking differently: Inhale 2 puffs into the lungs every 4 (four) hours as needed for wheezing or shortness of breath.  02/15/15  Yes Teague Bobbye Morton, PA-C  amLODipine (NORVASC) 5 MG tablet Take 5 mg by mouth every morning.  01/23/16  Yes [provider]  busPIRone (BUSPAR) 15 MG tablet Take 1 tablet (15 mg total) by mouth 3 (three) times daily. 07/30/18  Yes Charlcie Cradle, MD  diazepam (VALIUM) 5 MG tablet Take 1 tablet (5 mg total) by mouth 3 (three) times daily as needed for anxiety. 07/30/18  Yes Charlcie Cradle, MD  divalproex (DEPAKOTE) 500 MG DR tablet Take 1 tablet (500 mg total) by mouth at bedtime. 05/05/18  Yes Cameron Sprang, MD  dronabinol (MARINOL) 2.5 MG capsule Take 1 capsule (2.5 mg total) by mouth 3 (three) times daily before meals. 07/06/18  Yes Annitta Needs, NP  etonogestrel (NEXPLANON) 68 MG IMPL implant 1 each by Subdermal route once.   Yes [provider]  Fingolimod HCl (GILENYA) 0.5 MG CAPS Take 1 capsule (0.5 mg total) by mouth daily. Patient taking differently: Take 0.5 mg by mouth every morning.  05/05/18  Yes Cameron Sprang, MD  FLUoxetine (PROZAC) 40 MG capsule Take 2 capsules (80 mg total) by mouth every morning. 07/30/18 07/30/19 Yes Charlcie Cradle, MD  lithium carbonate (LITHOBID) 300 MG CR tablet Take 4 tablets (1,200 mg total) by mouth at bedtime. 07/30/18  Yes Charlcie Cradle, MD  loratadine (CLARITIN) 10 MG tablet Take 10 mg by mouth daily as needed for allergies.   Yes [provider]  methocarbamol (ROBAXIN) 750 MG tablet Take 1 tablet (750 mg total) by mouth every 8 (eight) hours as needed for muscle spasms. Patient taking differently: Take 750 mg by mouth 2 (two) times daily as needed for muscle spasms.  06/24/18 09/22/18 Yes Milinda Pointer, MD  Multiple Vitamins-Minerals (MULTIVITAMIN ADULT PO) Take 1 tablet by mouth daily.    Yes [provider]  NON FORMULARY Take 1 each by mouth at bedtime. CBD Gummy   Yes [provider]  omeprazole (PRILOSEC) 20 MG capsule TAKE 1 CAPSULE (20 MG TOTAL) BY MOUTH 2 (TWO) TIMES DAILY BEFORE A MEAL. 06/18/18  Yes Annitta Needs, NP  pregabalin (LYRICA) 100 MG capsule Take 1 capsule (100 mg total) by mouth 3 (three) times daily. Patient taking differently: Take 300 mg by mouth at bedtime.  06/24/18 09/22/18 Yes Milinda Pointer, MD  topiramate (TOPAMAX) 100 MG tablet Take 1.5 tablets twice a day 05/05/18  Yes Cameron Sprang, MD  EPINEPHrine (EPIPEN) 0.3 mg/0.3 mL SOAJ injection Inject 0.3 mg into the muscle as needed (allergic reaction). Reported on 03/28/2015    [provider]    Family History Family History  Problem Relation Age of Onset  . Diabetes Mother   . Hypertension Mother   . Diabetes Father   . Hypertension Father   . Arthritis Father   . Cancer Maternal Grandmother   . Cancer Maternal Grandfather   . Alcohol abuse Neg Hx   . Anxiety disorder Neg Hx   . Bipolar disorder Neg Hx   . Drug abuse Neg Hx   . Depression Neg Hx   . Colon cancer Neg Hx     Social History Social History   Tobacco Use  . Smoking status: Former Smoker    Packs/day: 0.25    Years: 10.00    Pack years: 2.50    Types: Cigarettes    Quit date: 07/12/2015    Years since quitting: 3.1  . Smokeless tobacco: Never Used  Substance Use Topics  . Alcohol use: No    Alcohol/week: 0.0 standard  drinks    Comment: Rare  . Drug use: No     Allergies   Amitriptyline, Baclofen, Duloxetine hcl, Gabapentin, Hydrocodone-acetaminophen, Magnesium salicylate, Modafinil, Monosodium glutamate, Other, Tizanidine, Alprazolam, Rizatriptan, Vicodin [hydrocodone-acetaminophen], Nsaids, Adhesive [tape], Ketorolac tromethamine, Lamotrigine, and Tramadol   Review of Systems Review of Systems  Musculoskeletal: Positive for arthralgias, back pain and neck pain.  Neurological: Positive for syncope (?psuedoseizure vs syncope vx fall), speech difficulty and headaches.  All other systems reviewed and are negative.    Physical Exam Updated Vital Signs BP (!) 147/78 (BP Location: Left Arm)   Pulse 84   Temp 98.3 F (36.8 C) (Oral)   Resp 16   Ht 5\' 5"  (1.651 m)   Wt 117.8 kg   SpO2 98%   BMI 43.22 kg/m   Physical Exam Vitals signs and nursing note reviewed.  Constitutional:      General: She is not in acute distress.    Appearance: She is well-developed.     Comments: Obese F who appears nontoxic  HENT:     Head: Normocephalic and atraumatic.     Comments: No obvious head trauma Eyes:     Extraocular Movements: Extraocular movements intact.     Conjunctiva/sclera: Conjunctivae normal.     Pupils: Pupils are equal, round, and reactive to light.     Comments: EOMI and PERRLA  Neck:     Musculoskeletal: Normal range of motion and neck supple.     Comments: No ttp of palpation. Pt moving head without difficulty Cardiovascular:     Rate and Rhythm: Normal rate and regular rhythm.     Pulses: Normal pulses.  Pulmonary:     Effort: Pulmonary effort is normal. No respiratory distress.     Breath sounds: Normal breath sounds. No wheezing.  Abdominal:  General: There is no distension.     Palpations: Abdomen is soft. There is no mass.     Tenderness: There is no abdominal tenderness. There is no guarding or rebound.     Comments: No ttp  Musculoskeletal:        General: Tenderness  present. No swelling, deformity or signs of injury.     Comments: ttp of R knee, rip, shoulder and elbow. No deformity noted. Multiple contusions noted in various stage of healing on all 4 extremities.  sensation intact x4. Strength intact x3,, although active movement is limited on the R side due to pain.   Skin:    General: Skin is warm and dry.     Capillary Refill: Capillary refill takes less than 2 seconds.  Neurological:     Mental Status: She is alert and oriented to person, place, and time.     Comments: Pt with dysphasia. lert and oriented without other neuro deficits.       ED Treatments / Results  Labs (all labs ordered are listed, but only abnormal results are displayed) Labs Reviewed - No data to display  EKG None  Radiology Dg Shoulder Right  Result Date: 09/13/2018 CLINICAL DATA:  Pt has had multiple falls recently and has also been seen at [facility]: RIGHT SHOULDER - 2+ VIEW COMPARISON:  09/11/2018 FINDINGS: There is no evidence of fracture or dislocation. There is no evidence of arthropathy or other focal bone abnormality. Soft tissues are unremarkable. IMPRESSION: Negative. Electronically Signed   By: Nolon Nations M.D.   On: 09/13/2018 15:01   Dg Elbow Complete Right  Result Date: 09/13/2018 CLINICAL DATA:  Pt has had multiple falls recently and has also been seen at [facility] at right knee anterior, bruise at anterior right shoulder, pain right elbow and right hip EXAM: RIGHT ELBOW - COMPLETE 3+ VIEW COMPARISON:  05/12/2008 FINDINGS: There is no evidence of fracture, dislocation, or joint effusion. There is no evidence of arthropathy or other focal bone abnormality. Soft tissues are unremarkable. IMPRESSION: Negative. Electronically Signed   By: Nolon Nations M.D.   On: 09/13/2018 15:02   Ct Head Wo Contrast  Result Date: 09/13/2018 CLINICAL DATA:  Status post fall today.  Initial encounter. EXAM: CT HEAD WITHOUT CONTRAST CT CERVICAL SPINE WITHOUT  CONTRAST TECHNIQUE: Multidetector CT imaging of the head and cervical spine was performed following the standard protocol without intravenous contrast. Multiplanar CT image reconstructions of the cervical spine were also generated. COMPARISON:  Head CT scan 09/11/2018. Brain MRI 06/04/2017. MRI cervical spine 03/18/2014. FINDINGS: CT HEAD FINDINGS Brain: No evidence of acute infarction, hemorrhage, hydrocephalus, extra-axial collection or mass lesion/mass effect. Vascular: No hyperdense vessel or unexpected calcification. Skull: Intact.  No focal lesion. Sinuses/Orbits: Small mucous retention cyst or polyp inferior aspect of the right maxillary sinus. Otherwise negative. Other: None. CT CERVICAL SPINE FINDINGS Alignment: Maintained with straightening of lordosis noted, unchanged. Skull base and vertebrae: No acute fracture. No primary bone lesion or focal pathologic process. Soft tissues and spinal canal: No prevertebral fluid or swelling. No visible canal hematoma. Disc levels: Large disc bulges with endplate spurring are seen at C5-6 and C6-7. The appearance is not grossly changed compared to the prior MRI. Upper chest: Negative. Other: None. IMPRESSION: No acute abnormality head or cervical spine. Chronic degenerative disc disease C5-6 and C6-7. Electronically Signed   By: Inge Rise M.D.   On: 09/13/2018 14:55   Ct Cervical Spine Wo Contrast  Result Date: 09/13/2018 CLINICAL  DATA:  Status post fall today.  Initial encounter. EXAM: CT HEAD WITHOUT CONTRAST CT CERVICAL SPINE WITHOUT CONTRAST TECHNIQUE: Multidetector CT imaging of the head and cervical spine was performed following the standard protocol without intravenous contrast. Multiplanar CT image reconstructions of the cervical spine were also generated. COMPARISON:  Head CT scan 09/11/2018. Brain MRI 06/04/2017. MRI cervical spine 03/18/2014. FINDINGS: CT HEAD FINDINGS Brain: No evidence of acute infarction, hemorrhage, hydrocephalus, extra-axial  collection or mass lesion/mass effect. Vascular: No hyperdense vessel or unexpected calcification. Skull: Intact.  No focal lesion. Sinuses/Orbits: Small mucous retention cyst or polyp inferior aspect of the right maxillary sinus. Otherwise negative. Other: None. CT CERVICAL SPINE FINDINGS Alignment: Maintained with straightening of lordosis noted, unchanged. Skull base and vertebrae: No acute fracture. No primary bone lesion or focal pathologic process. Soft tissues and spinal canal: No prevertebral fluid or swelling. No visible canal hematoma. Disc levels: Large disc bulges with endplate spurring are seen at C5-6 and C6-7. The appearance is not grossly changed compared to the prior MRI. Upper chest: Negative. Other: None. IMPRESSION: No acute abnormality head or cervical spine. Chronic degenerative disc disease C5-6 and C6-7. Electronically Signed   By: Inge Rise M.D.   On: 09/13/2018 14:55   Dg Knee Complete 4 Views Right  Result Date: 09/13/2018 CLINICAL DATA:  Right knee pain after fall today. Initial encounter. EXAM: RIGHT KNEE - COMPLETE 4+ VIEW COMPARISON:  Plain films right knee 04/30/2014. FINDINGS: No evidence of fracture, dislocation, or joint effusion. No evidence of arthropathy or other focal bone abnormality. Soft tissues are unremarkable. IMPRESSION: Negative exam. Electronically Signed   By: Inge Rise M.D.   On: 09/13/2018 15:02   Dg Hip Unilat W Or Wo Pelvis 2-3 Views Right  Result Date: 09/13/2018 CLINICAL DATA:  Pain status post fall. EXAM: DG HIP (WITH OR WITHOUT PELVIS) 2-3V RIGHT COMPARISON:  None. FINDINGS: There is no evidence of hip fracture or dislocation. There is no evidence of arthropathy or other focal bone abnormality. IMPRESSION: Negative. Electronically Signed   By: Fidela Salisbury M.D.   On: 09/13/2018 15:01    Procedures Procedures (including critical care time)  Medications Ordered in ED Medications  midazolam (VERSED) injection 2 mg (2 mg  Intramuscular Given 09/13/18 1312)     Initial Impression / Assessment and Plan / ED Course  I have reviewed the triage vital signs and the nursing notes.  Pertinent labs & imaging results that were available during my care of the patient were reviewed by me and considered in my medical decision making (see chart for details).        Patient presenting for evaluation after a fall.  This may have been due to his pseudoseizure versus syncope versus mechanical fall.  Reporting pain on the right side of her body.  This is complicated by the fact the patient has chronic pain, and difficult to define what is new and not.  Additionally, patient with dysphagia, which is improving since the fall.  No history of similar.  No other neurologic deficits.  Patient with significant psych disorder, I have a strong suspicion that patient's dysphagia is due to a psychiatric cause.  However as this occurred after trauma, will obtain CT head and neck for further evaluation. Case discussed with attending, Dr. Lita Mains agrees to plan.   CT head and neck negative for fracture or bleed.  X-ray of the shoulder, hip, knee, and elbow read interpreted by me, no fracture dislocation.  Discussed findings with patient.  Upon reevaluation, patient has periods of normal speech.  I observed that patient would speak normally and then realized that she was doing so and thus change her speech pattern with extra delays and appearance of difficulty getting her words out.  As such, I have a high suspicion that this is psych related/conversion disorder.  I do not believe this is due to an acute intracranial abnormality such as a stroke.  Patient is requesting pain medicine.  I discussed that we do not give chronic medicine from the ER for this. I offered multiple other non-narcotic forms of pain control, pt refused.  I recommended she follow-up with her primary care doctor and her pain clinic.  At this time, patient appears safe discharge.   Return precautions given.  Patient states she understands.  Pt requesting to speak to my attending. Dr Regenia Skeeter evaluated the pt. Will give dose of IM robaxin for pain.  Final Clinical Impressions(s) / ED Diagnoses   Final diagnoses:  Fall, initial encounter  Right shoulder pain, unspecified chronicity  Right hip pain  Recurrent pain of right knee    ED Discharge Orders    None        Franchot Heidelberg, PA-C 09/13/18 1545    Sherwood Gambler, MD 09/13/18 1554

## 2018-09-13 NOTE — ED Notes (Signed)
AC called for Robaxin IM, not available in pyxis.

## 2018-09-13 NOTE — ED Triage Notes (Signed)
Pt brought in from home after an unwitnessed fall. Pt has had multiple falls recently and has also been seen at [facility]. Pt talking like someone mentally challenged at triage.

## 2018-09-15 ENCOUNTER — Ambulatory Visit (HOSPITAL_COMMUNITY): Admission: RE | Admit: 2018-09-15 | Payer: Medicare Other | Source: Ambulatory Visit

## 2018-09-15 ENCOUNTER — Ambulatory Visit (HOSPITAL_COMMUNITY): Payer: Medicare Other | Admitting: Vascular Surgery

## 2018-09-15 ENCOUNTER — Encounter (HOSPITAL_COMMUNITY): Payer: Self-pay | Admitting: Anesthesiology

## 2018-09-15 ENCOUNTER — Ambulatory Visit: Payer: Medicare Other | Admitting: Psychology

## 2018-09-15 ENCOUNTER — Telehealth: Payer: Self-pay | Admitting: Neurology

## 2018-09-15 ENCOUNTER — Ambulatory Visit (HOSPITAL_COMMUNITY)
Admission: RE | Admit: 2018-09-15 | Discharge: 2018-09-15 | Disposition: A | Discharge status: Home / Self Care | Payer: Medicare Other | Attending: Anesthesiology | Admitting: Anesthesiology

## 2018-09-15 ENCOUNTER — Encounter (HOSPITAL_COMMUNITY): Payer: Self-pay

## 2018-09-15 ENCOUNTER — Encounter (HOSPITAL_COMMUNITY): Admission: RE | Disposition: A | Discharge status: Home / Self Care | Payer: Self-pay | Source: Home / Self Care

## 2018-09-15 ENCOUNTER — Other Ambulatory Visit: Payer: Self-pay

## 2018-09-15 DIAGNOSIS — K219 Gastro-esophageal reflux disease without esophagitis: Secondary | ICD-10-CM | POA: Diagnosis not present

## 2018-09-15 DIAGNOSIS — F329 Major depressive disorder, single episode, unspecified: Secondary | ICD-10-CM | POA: Insufficient documentation

## 2018-09-15 DIAGNOSIS — G35 Multiple sclerosis: Secondary | ICD-10-CM | POA: Diagnosis not present

## 2018-09-15 DIAGNOSIS — Z886 Allergy status to analgesic agent status: Secondary | ICD-10-CM | POA: Diagnosis not present

## 2018-09-15 DIAGNOSIS — J45909 Unspecified asthma, uncomplicated: Secondary | ICD-10-CM | POA: Insufficient documentation

## 2018-09-15 DIAGNOSIS — I10 Essential (primary) hypertension: Secondary | ICD-10-CM | POA: Insufficient documentation

## 2018-09-15 DIAGNOSIS — F4310 Post-traumatic stress disorder, unspecified: Secondary | ICD-10-CM | POA: Diagnosis not present

## 2018-09-15 DIAGNOSIS — Z6841 Body Mass Index (BMI) 40.0 and over, adult: Secondary | ICD-10-CM | POA: Insufficient documentation

## 2018-09-15 DIAGNOSIS — Z888 Allergy status to other drugs, medicaments and biological substances status: Secondary | ICD-10-CM | POA: Insufficient documentation

## 2018-09-15 DIAGNOSIS — Z79899 Other long term (current) drug therapy: Secondary | ICD-10-CM | POA: Diagnosis not present

## 2018-09-15 DIAGNOSIS — F410 Panic disorder [episodic paroxysmal anxiety] without agoraphobia: Secondary | ICD-10-CM | POA: Diagnosis not present

## 2018-09-15 DIAGNOSIS — M1990 Unspecified osteoarthritis, unspecified site: Secondary | ICD-10-CM | POA: Insufficient documentation

## 2018-09-15 DIAGNOSIS — G43909 Migraine, unspecified, not intractable, without status migrainosus: Secondary | ICD-10-CM | POA: Diagnosis not present

## 2018-09-15 DIAGNOSIS — Z87891 Personal history of nicotine dependence: Secondary | ICD-10-CM | POA: Insufficient documentation

## 2018-09-15 DIAGNOSIS — Z885 Allergy status to narcotic agent status: Secondary | ICD-10-CM | POA: Diagnosis not present

## 2018-09-15 HISTORY — PX: RADIOLOGY WITH ANESTHESIA: SHX6223

## 2018-09-15 LAB — POCT PREGNANCY, URINE: Preg Test, Ur: NEGATIVE

## 2018-09-15 LAB — CBC
HCT: 42.9 % (ref 36.0–46.0)
Hemoglobin: 13.6 g/dL (ref 12.0–15.0)
MCH: 29.9 pg (ref 26.0–34.0)
MCHC: 31.7 g/dL (ref 30.0–36.0)
MCV: 94.3 fL (ref 80.0–100.0)
Platelets: 386 10*3/uL (ref 150–400)
RBC: 4.55 MIL/uL (ref 3.87–5.11)
RDW: 13.2 % (ref 11.5–15.5)
WBC: 6.7 10*3/uL (ref 4.0–10.5)
nRBC: 0 % (ref 0.0–0.2)

## 2018-09-15 LAB — BASIC METABOLIC PANEL
Anion gap: 11 (ref 5–15)
BUN: 10 mg/dL (ref 6–20)
CO2: 21 mmol/L — ABNORMAL LOW (ref 22–32)
Calcium: 8.5 mg/dL — ABNORMAL LOW (ref 8.9–10.3)
Chloride: 107 mmol/L (ref 98–111)
Creatinine, Ser: 0.81 mg/dL (ref 0.44–1.00)
GFR calc Af Amer: >60 mL/min (ref >60)
GFR calc non Af Amer: >60 mL/min (ref >60)
Glucose, Bld: 88 mg/dL (ref 70–99)
Potassium: 3.2 mmol/L — ABNORMAL LOW (ref 3.5–5.1)
Sodium: 139 mmol/L (ref 135–145)

## 2018-09-15 SURGERY — MRI WITH ANESTHESIA
Anesthesia: General

## 2018-09-15 MED ORDER — FENTANYL CITRATE (PF) 100 MCG/2ML IJ SOLN
50.0000 ug | INTRAMUSCULAR | Status: AC | PRN
  Administered 2018-09-15 (×2): 50 ug via INTRAVENOUS

## 2018-09-15 MED ORDER — LACTATED RINGERS IV SOLN: INTRAVENOUS | Status: DC

## 2018-09-15 MED ORDER — FENTANYL CITRATE (PF) 100 MCG/2ML IJ SOLN
INTRAMUSCULAR | Status: AC
  Filled 2018-09-15: qty 2

## 2018-09-15 NOTE — Addendum Note (Signed)
Addendum  created 09/15/18 1435 by Suzette Battiest, MD   Clinical Note Signed

## 2018-09-15 NOTE — Telephone Encounter (Signed)
Patient is calling to see if she can speak to someone about her back and throat is hurting. She is has been on her back from 9:00 to now she is been on her back and she is in pain.  She has called the PCP but could not get through at this time. She needs something for pain please call she is still at the ED    Pt states that they are getting ready to take the IV out she states she needs to speak to someone soon

## 2018-09-15 NOTE — Telephone Encounter (Signed)
Patient left the following message with the after hours answering service on 09/16/2018 at 4:30 PM:  Caller is currently in the ER. The ER doctor spoke to Dr. Delice Lesch, and the patient is being discharged from the ER today. She had a seizure earlier today and fell. She now has a headache. It is not a migraine, it hurts where she hit it. She wanted to see if something else could be done today. The ER doctor that talked to Dr. Delice Lesch right before shirt change. The new doctor has a new agenda. He did not talk to Dr. Delice Lesch. She hurt her knee, hip, shoulder, and head when she fell. Nothing is broke, nothing is bleeding. She hurts really bad with all the jostling and moving. The new ER doctor said if nothing is broken, they do not give pain medicine. She has a contusion, goose egg on the back of the head. She wants to know if Dr. Delice Lesch could do something for her. She can't take NSAIDS. She is allergic to Toradol and Tramadol.  Nurse: Wayne Sever, RN  Caller is in the ER. Advised talking to the ER doctor again about something for pain, because he is the one that will be prescribing medicine for her. Explained that the doctors in this office do not call in new medications after hours. Instructed that they definitely cannot call in a prescription for a narcotic on the weekend. She asked if Dr. Delice Lesch could call her PCP to talk to them about pain medicine in the past for kidney stones. Instructed that Dr. Delice Lesch is not the one On Call right now. Instructed that she herself can call her PCP office and talk to their On Call about it.

## 2018-09-15 NOTE — Transfer of Care (Signed)
Immediate Anesthesia Transfer of Care Note  Patient: Emily [name]  Procedure(s) Performed: MRI OF BRAIN WITH AND WITHOUT CONTRAST (N/A )  Patient Location: PACU  Anesthesia Type:General  Level of Consciousness: awake, alert  and oriented  Airway & Oxygen Therapy: Patient Spontanous Breathing  Post-op Assessment: Report given to RN, Post -op Vital signs reviewed and stable and Patient moving all extremities  Post vital signs: Reviewed and stable  Last Vitals:  Vitals Value Taken Time  BP 152/99 09/15/18 1330  Temp 36.8 C 09/15/18 1328  Pulse 79 09/15/18 1334  Resp 21 09/15/18 1334  SpO2 95 % 09/15/18 1334  Vitals shown include unvalidated device data.  Last Pain:  Vitals:   09/15/18 1330  TempSrc:   PainSc: 8       Patients Stated Pain Goal: 4 ([id number])  Complications: No apparent anesthesia complications

## 2018-09-15 NOTE — Telephone Encounter (Signed)
Noted. Would discuss pain meds with her Pain Mgt specialist.

## 2018-09-15 NOTE — Telephone Encounter (Signed)
Would need to r/s MRI then. For pain unfortunately there is nothing I can prescribe her, she really needs to talk to her Pain Management specialist. Thanks

## 2018-09-15 NOTE — Anesthesia Postprocedure Evaluation (Signed)
Anesthesia Post Note  Patient: [name]  Procedure(s) Performed: MRI OF BRAIN WITH AND WITHOUT CONTRAST (N/A )     Patient location during evaluation: PACU Anesthesia Type: General Level of consciousness: awake and alert Pain management: pain level controlled Vital Signs Assessment: post-procedure vital signs reviewed and stable Respiratory status: spontaneous breathing, nonlabored ventilation, respiratory function stable and patient connected to nasal cannula oxygen Cardiovascular status: blood pressure returned to baseline and stable Postop Assessment: no apparent nausea or vomiting Anesthetic complications: no    Last Vitals:  Vitals:   09/15/18 1345 09/15/18 1400  BP:  (!) 155/89  Pulse:    Resp: 18   Temp: 36.7 C   SpO2:      Last Pain:  Vitals:   09/15/18 1345  TempSrc:   PainSc: 6                  Emily [name]

## 2018-09-15 NOTE — Anesthesia Procedure Notes (Signed)
Procedure Name: Intubation Date/Time: 09/15/2018 12:43 PM Performed by: Amadeo Garnet, CRNA Pre-anesthesia Checklist: Patient identified, Emergency Drugs available, Suction available, Patient being monitored and Timeout performed Patient Re-evaluated:Patient Re-evaluated prior to induction Oxygen Delivery Method: Circle system utilized Preoxygenation: Pre-oxygenation with 100% oxygen Induction Type: IV induction Ventilation: Mask ventilation without difficulty Laryngoscope Size: Mac and 3 Grade View: Grade I Tube type: Oral Tube size: 7.0 mm Number of attempts: 1 Airway Equipment and Method: Stylet

## 2018-09-15 NOTE — Telephone Encounter (Signed)
Patient called requesting to check in with the nurse. She said the MRI was not completed today due to a machine malfunction and she'd like to know what she should do next. Patient emphasized she is also still in pain.

## 2018-09-16 ENCOUNTER — Encounter (HOSPITAL_COMMUNITY): Payer: Self-pay | Admitting: Radiology

## 2018-09-16 ENCOUNTER — Encounter: Payer: Medicare Other | Admitting: Pain Medicine

## 2018-09-16 DIAGNOSIS — Z87891 Personal history of nicotine dependence: Secondary | ICD-10-CM | POA: Diagnosis not present

## 2018-09-16 DIAGNOSIS — W19XXXA Unspecified fall, initial encounter: Secondary | ICD-10-CM | POA: Diagnosis not present

## 2018-09-16 DIAGNOSIS — Z79899 Other long term (current) drug therapy: Secondary | ICD-10-CM | POA: Diagnosis not present

## 2018-09-16 DIAGNOSIS — R569 Unspecified convulsions: Secondary | ICD-10-CM | POA: Diagnosis not present

## 2018-09-16 DIAGNOSIS — F445 Conversion disorder with seizures or convulsions: Secondary | ICD-10-CM | POA: Diagnosis not present

## 2018-09-16 DIAGNOSIS — R52 Pain, unspecified: Secondary | ICD-10-CM | POA: Diagnosis not present

## 2018-09-16 NOTE — Telephone Encounter (Signed)
Spoke with pt. She just wanted to make [place] aware that the MRI was cancelled and that it was not r/s.  Also that her back pain is from lying on the table for hours during the scan. Per pt she arrived at 8. Taken back at 9. Started scan at 11:30 and finished at 12:30. Then was told the machine was broken. The pt states that she c/o her back hurting and was told they would give her " a push" and that was never given. This made her upset per the staff in radiology. Pt said she was not upset.  Need to call and have MRI Brain rescheduled.

## 2018-09-16 NOTE — Telephone Encounter (Signed)
Routing to [place] for review as this may have been routed to me in error. Patient is aware she'll need to reschedule the MRI.

## 2018-09-16 NOTE — Telephone Encounter (Signed)
Patient is calling back to see what the status of the the prior messages that were left yesterday please call

## 2018-09-17 ENCOUNTER — Ambulatory Visit (INDEPENDENT_AMBULATORY_CARE_PROVIDER_SITE_OTHER): Payer: Medicare Other | Admitting: Psychiatry

## 2018-09-17 ENCOUNTER — Ambulatory Visit (INDEPENDENT_AMBULATORY_CARE_PROVIDER_SITE_OTHER): Payer: Medicare Other | Admitting: Psychology

## 2018-09-17 ENCOUNTER — Encounter: Payer: Self-pay | Admitting: Pain Medicine

## 2018-09-17 ENCOUNTER — Other Ambulatory Visit: Payer: Self-pay

## 2018-09-17 ENCOUNTER — Encounter (HOSPITAL_COMMUNITY): Payer: Self-pay | Admitting: Psychiatry

## 2018-09-17 ENCOUNTER — Ambulatory Visit (HOSPITAL_COMMUNITY): Payer: Medicare Other

## 2018-09-17 DIAGNOSIS — F411 Generalized anxiety disorder: Secondary | ICD-10-CM

## 2018-09-17 DIAGNOSIS — I639 Cerebral infarction, unspecified: Secondary | ICD-10-CM | POA: Diagnosis not present

## 2018-09-17 DIAGNOSIS — F332 Major depressive disorder, recurrent severe without psychotic features: Secondary | ICD-10-CM

## 2018-09-17 DIAGNOSIS — F4001 Agoraphobia with panic disorder: Secondary | ICD-10-CM | POA: Diagnosis not present

## 2018-09-17 DIAGNOSIS — F4310 Post-traumatic stress disorder, unspecified: Secondary | ICD-10-CM

## 2018-09-17 DIAGNOSIS — F902 Attention-deficit hyperactivity disorder, combined type: Secondary | ICD-10-CM

## 2018-09-17 DIAGNOSIS — F445 Conversion disorder with seizures or convulsions: Secondary | ICD-10-CM | POA: Diagnosis not present

## 2018-09-17 DIAGNOSIS — G43611 Persistent migraine aura with cerebral infarction, intractable, with status migrainosus: Secondary | ICD-10-CM | POA: Diagnosis not present

## 2018-09-17 DIAGNOSIS — F3181 Bipolar II disorder: Secondary | ICD-10-CM

## 2018-09-17 DIAGNOSIS — F410 Panic disorder [episodic paroxysmal anxiety] without agoraphobia: Secondary | ICD-10-CM | POA: Diagnosis not present

## 2018-09-17 DIAGNOSIS — F4010 Social phobia, unspecified: Secondary | ICD-10-CM | POA: Diagnosis not present

## 2018-09-17 MED ORDER — LITHIUM CARBONATE ER 300 MG PO TBCR: 600.0000 mg | EXTENDED_RELEASE_TABLET | Freq: Two times a day (BID) | ORAL | 1 refills | Status: DC

## 2018-09-17 MED ORDER — FLUOXETINE HCL 40 MG PO CAPS: 80.0000 mg | ORAL_CAPSULE | ORAL | 1 refills | Status: DC

## 2018-09-17 MED ORDER — HALOPERIDOL 1 MG PO TABS: 1.0000 mg | ORAL_TABLET | Freq: Two times a day (BID) | ORAL | 1 refills | Status: DC

## 2018-09-17 MED ORDER — BUSPIRONE HCL 15 MG PO TABS: 15.0000 mg | ORAL_TABLET | Freq: Three times a day (TID) | ORAL | 1 refills | Status: DC

## 2018-09-17 MED ORDER — DIAZEPAM 5 MG PO TABS: 5.0000 mg | ORAL_TABLET | Freq: Three times a day (TID) | ORAL | 1 refills | Status: DC | PRN

## 2018-09-17 NOTE — Progress Notes (Signed)
Virtual Visit via Telephone Note  I connected with [place]  on 09/17/18 at  1:15 PM EDT by telephone and verified that I am speaking with the correct person using two identifiers.  Location: Patient: home Provider: office   I discussed the limitations, risks, security and privacy concerns of performing an evaluation and management service by telephone and the availability of in person appointments. I also discussed with the patient that there may be a patient responsible charge related to this service. The patient expressed understanding and agreed to proceed.   History of Present Illness: "Not good". Pt has been stressed. She states she has been in/out of the ED for the last 2 weeks due to seizures. She had to pay her ex-boyfriend $1000 to sign away his rights. It was hard to get the money together. She also got tested for ADHD at [facility]. Gianna is supposed to get the results today.  She is very upset because she has been accused of being "a drug seeker" because she has asked for meds to help her pain from her seizures and back. Her anxiety and depression are worse. "Last night was the 1st time I ever contemplated suicide because no one was believing me". She is feeling alone and "stupid". Her parents are yelling at her. When asked to be more specific she stated "I might as well be dead". She denies any thoughts of suicide today. She denies any plan or intent to committ suicide at any time. "I don't want to hurt myself or anyone else. The depression is just - I am stuck- I have nothing". She is very worried about how to provide for her daughter. Pt denies HI. [place] is having poor sleep.     Observations/Objective: I spoke with Karolyna L Klarich on the phone.  Pt was calm, pleasant and cooperative.  Pt was engaged in the conversation and answered questions appropriately.  Speech was clear and coherent with normal rate, tone and volume.  Mood is depressed and anxious,  affect is congruent. Thought processes are coherent and circumstantial.  Thought content is with ruminations.  Pt denies SI/HI.   Pt denies auditory and visual hallucinations and did not appear to be responding to internal stimuli.  Memory and concentration are good.  Fund of knowledge and use of language are average.  Insight and judgment are fair.  I am unable to comment on psychomotor activity, general appearance, hygiene, or eye contact as I was unable to physically see the patient on the phone.  Vital signs not available since interview conducted virtually.     Assessment and Plan: GAD; Conversion d/o with pseudoseizures; MDD-recurrent, moderate; PTSD; Insomnia  Start Haldol 1mg  po BID for mood stabalization  Increase Lithium CR 1500mg  po qD  Prozac 80mg  po qD  Buspar 15mg  po TID  Valium 5mg  TID prn anxiety. I may taper off if anxiety does not improve. She has been  taking for several months without any reported side effects or adverse reactions. I declined her request to start Ativan.   Topamax and Depakote prescribed by her neurologist  Reviewed labs 09/15/2018: K 3.2, Ca 8.5, CBC wnl, pregnancy test negative 08/13/2018: Bun 13, Creatinine 1.06; TSH wnl; Lithium 0.5  Status of current symptoms: worsening anxiety and depression    Follow Up Instructions: In 2-3 weeks or sooner if needed   I discussed the assessment and treatment plan with the patient. The patient was provided an opportunity to ask questions and all were  answered. The patient agreed with the plan and demonstrated an understanding of the instructions.   The patient was advised to call back or seek an in-person evaluation if the symptoms worsen or if the condition fails to improve as anticipated.  I provided 40 minutes of non-face-to-face time during this encounter.   Charlcie Cradle, MD

## 2018-09-18 MED ORDER — MIDAZOLAM HCL 2 MG/2ML IJ SOLN
INTRAMUSCULAR | Status: DC | PRN
  Administered 2018-09-15: 2 mg via INTRAVENOUS

## 2018-09-18 MED ORDER — LIDOCAINE 2% (20 MG/ML) 5 ML SYRINGE
INTRAMUSCULAR | Status: DC | PRN
  Administered 2018-09-15: 100 mg via INTRAVENOUS

## 2018-09-18 MED ORDER — FENTANYL CITRATE (PF) 100 MCG/2ML IJ SOLN
INTRAMUSCULAR | Status: DC | PRN
  Administered 2018-09-15 (×2): 50 ug via INTRAVENOUS

## 2018-09-18 MED ORDER — LACTATED RINGERS IV SOLN
INTRAVENOUS | Status: DC | PRN
  Administered 2018-09-15: 12:00:00 via INTRAVENOUS

## 2018-09-18 MED ORDER — PROPOFOL 10 MG/ML IV BOLUS
INTRAVENOUS | Status: DC | PRN
  Administered 2018-09-15: 200 mg via INTRAVENOUS

## 2018-09-18 MED ORDER — ONDANSETRON HCL 4 MG/2ML IJ SOLN
INTRAMUSCULAR | Status: DC | PRN
  Administered 2018-09-15: 4 mg via INTRAVENOUS

## 2018-09-18 MED ORDER — SUCCINYLCHOLINE CHLORIDE 200 MG/10ML IV SOSY
PREFILLED_SYRINGE | INTRAVENOUS | Status: DC | PRN
  Administered 2018-09-15: 120 mg via INTRAVENOUS

## 2018-09-18 MED ORDER — ALBUTEROL SULFATE HFA 108 (90 BASE) MCG/ACT IN AERS
INHALATION_SPRAY | RESPIRATORY_TRACT | Status: DC | PRN
  Administered 2018-09-15: 6 via RESPIRATORY_TRACT

## 2018-09-18 NOTE — Telephone Encounter (Signed)
MRI needs to be under anesthesia.  Left message with Aniceto Boss at [facility] to return call

## 2018-09-18 NOTE — Addendum Note (Signed)
Addendum  created 09/18/18 0626 by Harden Mo, CRNA   Intraprocedure Meds edited

## 2018-09-19 DIAGNOSIS — M25512 Pain in left shoulder: Secondary | ICD-10-CM | POA: Diagnosis not present

## 2018-09-19 DIAGNOSIS — R4020 Unspecified coma: Secondary | ICD-10-CM | POA: Diagnosis not present

## 2018-09-19 DIAGNOSIS — S6992XA Unspecified injury of left wrist, hand and finger(s), initial encounter: Secondary | ICD-10-CM | POA: Diagnosis not present

## 2018-09-19 DIAGNOSIS — S0990XA Unspecified injury of head, initial encounter: Secondary | ICD-10-CM | POA: Diagnosis not present

## 2018-09-19 DIAGNOSIS — R0689 Other abnormalities of breathing: Secondary | ICD-10-CM | POA: Diagnosis not present

## 2018-09-19 DIAGNOSIS — S199XXA Unspecified injury of neck, initial encounter: Secondary | ICD-10-CM | POA: Diagnosis not present

## 2018-09-19 DIAGNOSIS — G43909 Migraine, unspecified, not intractable, without status migrainosus: Secondary | ICD-10-CM | POA: Diagnosis not present

## 2018-09-19 DIAGNOSIS — S63502A Unspecified sprain of left wrist, initial encounter: Secondary | ICD-10-CM | POA: Diagnosis not present

## 2018-09-19 DIAGNOSIS — W19XXXA Unspecified fall, initial encounter: Secondary | ICD-10-CM | POA: Diagnosis not present

## 2018-09-19 DIAGNOSIS — Z79899 Other long term (current) drug therapy: Secondary | ICD-10-CM | POA: Diagnosis not present

## 2018-09-19 DIAGNOSIS — R569 Unspecified convulsions: Secondary | ICD-10-CM | POA: Diagnosis not present

## 2018-09-19 DIAGNOSIS — F29 Unspecified psychosis not due to a substance or known physiological condition: Secondary | ICD-10-CM | POA: Diagnosis not present

## 2018-09-19 DIAGNOSIS — S4992XA Unspecified injury of left shoulder and upper arm, initial encounter: Secondary | ICD-10-CM | POA: Diagnosis not present

## 2018-09-19 DIAGNOSIS — S01512A Laceration without foreign body of oral cavity, initial encounter: Secondary | ICD-10-CM | POA: Diagnosis not present

## 2018-09-19 DIAGNOSIS — Z87891 Personal history of nicotine dependence: Secondary | ICD-10-CM | POA: Diagnosis not present

## 2018-09-20 DIAGNOSIS — S31109S Unspecified open wound of abdominal wall, unspecified quadrant without penetration into peritoneal cavity, sequela: Secondary | ICD-10-CM | POA: Insufficient documentation

## 2018-09-20 DIAGNOSIS — T8149XA Infection following a procedure, other surgical site, initial encounter: Secondary | ICD-10-CM | POA: Insufficient documentation

## 2018-09-20 NOTE — Progress Notes (Signed)
Pain Management Virtual Encounter Note - Virtual Visit via Telephone Telehealth (real-time audio visits between healthcare provider and patient).   Patient's Phone No. & Preferred Pharmacy:  [phone] (home); [phone] (mobile); (Preferred) [phone] [email or web address]  [id number] [place], [place] [address] [place] [address] Phone: [phone] Fax: [phone]    Pre-screening note:  Our staff contacted Emily [name] and offered her an "in person", "face-to-face" appointment versus a telephone encounter. She indicated preferring the telephone encounter, at this time.   Reason for Virtual Visit: COVID-19*  Social distancing based on CDC and AMA recommendations.   I contacted Emily [name] on 09/21/2018 via telephone.      I clearly identified myself as Gaspar Cola, MD. I verified that I was speaking with the correct person using two identifiers (Name: Emily [name], and date of birth: July 26, 1980).  Advanced Informed Consent I sought verbal advanced consent from Emily [name] for virtual visit interactions. I informed Emily [name] of possible security and privacy concerns, risks, and limitations associated with providing "not-in-person" medical evaluation and management services. I also informed Emily [name] of the availability of "in-person" appointments. Finally, I informed her that there would be a charge for the virtual visit and that she could be  personally, fully or partially, financially responsible for it. Emily [name] expressed understanding and agreed to proceed.   Historic Elements   Emily [name] is a 38 y.o. year old, female patient evaluated today after her last encounter by our practice on 09/09/2018. Emily [name]  has a past medical history of Anxiety, Asthma (as a child), Chronic back pain, Chronic pain, Conversion disorder, DDD (degenerative disc disease), cervical, Depression, External  hemorrhoid, Fatty liver, GERD (gastroesophageal reflux disease), History of kidney stones, HTN (hypertension) (02/27/2015), JC virus antibody positive, Migraines, MS (multiple sclerosis) ([place]), Neuropathy, OA (osteoarthritis), Ovarian cyst, Panic attack, Perforated bowel ([place]) (2009), Pseudoseizures, PTSD (post-traumatic stress disorder), S/P emergency C-section, Seasonal allergies, Urinary urgency, Vertigo, Wears contact lenses, and Wears glasses. She also  has a past surgical history that includes Extremity cyst excision (1994); Bowel resection (01/2007); Colostomy closure (04/2007); Scar revision (01/21/2011); Hernia repair; Abdominal surgery; Appendectomy; Cesarean section (N/A, 07/12/2015); Incisional hernia repair (N/A, 02/16/2016); Insertion of mesh (02/16/2016); Tooth Extraction (Left, 10/2016); Colonoscopy with propofol (N/A, 01/29/2017); Radiology with anesthesia (N/A, 03/06/2017); Exam under anesthesia with hemorrhoidectomy (N/A, 06/11/2018); Excisional hemorrhoidectomy; Transrectal drainage of pelvic abscess; and Radiology with anesthesia (N/A, 09/15/2018). Emily [name] has a current medication list which includes the following prescription(s): aimovig, albuterol, amlodipine, divalproex, dronabinol, epinephrine, etonogestrel, fingolimod hcl, loratadine, methocarbamol, multiple vitamins-minerals, NON FORMULARY, omeprazole, pregabalin, topiramate, buspirone, diazepam, fluoxetine, haloperidol, and lithium carbonate, and the following Facility-Administered Medications: metoclopramide. She  reports that she quit smoking about 3 years ago. Her smoking use included cigarettes. She has a 2.50 pack-year smoking history. She has never used smokeless tobacco. She reports that she does not drink alcohol or use drugs. Emily [name] is allergic to amitriptyline; baclofen; duloxetine hcl; gabapentin; hydrocodone-acetaminophen; magnesium salicylate; modafinil; monosodium glutamate; other; tizanidine; alprazolam; rizatriptan; vicodin  [hydrocodone-acetaminophen]; nsaids; adhesive [tape]; ketorolac tromethamine; lamotrigine; and tramadol.   HPI  Today, she is being contacted for follow-up after cancelling procedure due to infected open abdominal wound from recent surgery. Today we assessed her pregabalin (Lyrica) 100 mg PO TID for possible increase to 150 mg TID vs 225 mg BID.  Although I do understand the patient is interested in going  ahead with a interventional procedures as soon as possible, all of these are elective procedures and they will be held until we have complete resolution and closure of the patient's abdominal wound infection.  Today I have informed the patient that I will be canceling the radiofrequency schedule for 10/13/2018, until further notice.  Unfortunately, I have learned that I cannot always trust what the patient tells me and I will need to personally examine the area before I go ahead with scheduling her radiofrequency.  Pharmacotherapy Assessment  Analgesic: None from our practice due to benzodiazepine use and high risk.   Monitoring: Pharmacotherapy: No side-effects or adverse reactions reported. [place] PMP: PDMP reviewed during this encounter.       Compliance: No problems identified. Effectiveness: Clinically acceptable. Plan: Refer to "POC".  UDS:  Summary  Date Value Ref Range Status  03/16/2018 FINAL  Final    Comment:    ==================================================================== TOXASSURE COMP DRUG ANALYSIS,UR ==================================================================== Test                             Result       Flag       Units Drug Present and Declared for Prescription Verification   Desmethyldiazepam              214          EXPECTED   ng/mg creat   Oxazepam                       1241         EXPECTED   ng/mg creat   Temazepam                      1052         EXPECTED   ng/mg creat    Desmethyldiazepam, oxazepam, and temazepam are expected    metabolites of  diazepam. Desmethyldiazepam and oxazepam are also    expected metabolites of other drugs, including chlordiazepoxide,    prazepam, clorazepate, and halazepam. Oxazepam is an expected    metabolite of temazepam. Oxazepam and temazepam are also    available as scheduled prescription medications.   Pregabalin                     PRESENT      EXPECTED   Topiramate                     PRESENT      EXPECTED   Methocarbamol                  PRESENT      EXPECTED   Fluoxetine                     PRESENT      EXPECTED   Norfluoxetine                  PRESENT      EXPECTED    Norfluoxetine is an expected metabolite of fluoxetine. Drug Absent but Declared for Prescription Verification   Prochlorperazine               Not Detected UNEXPECTED   Ibuprofen                      Not Detected UNEXPECTED    Ibuprofen, as indicated in the declared  medication list, is not    always detected even when used as directed. ==================================================================== Test                      Result    Flag   Units      Ref Range   Creatinine              29               mg/dL      >=20 ==================================================================== Declared Medications:  The flagging and interpretation on this report are based on the  following declared medications.  Unexpected results may arise from  inaccuracies in the declared medications.  **Note: The testing scope of this panel includes these medications:  Diazepam (Valium)  Fluoxetine (Prozac)  Methocarbamol (Robaxin)  Pregabalin (Lyrica)  Prochlorperazine (Compazine)  Topiramate (Topamax)  **Note: The testing scope of this panel does not include small to  moderate amounts of these reported medications:  Ibuprofen  **Note: The testing scope of this panel does not include following  reported medications:  Albuterol  Amlodipine (Norvasc)  Buspirone (BuSpar)  Divalproex (Depakote)  Epinephrine (EpiPen)  Etonogestrel  (Nexplanon)  Fingolimod (Gilenya)  Lithium (Lithobid)  Loratadine (Claritin)  Multivitamin  Ranitidine (Zantac) ==================================================================== For clinical consultation, please call [phone]. ====================================================================    Laboratory Chemistry Profile (12 mo)  Renal: 08/13/2018: BUN/Creatinine Ratio 12 09/15/2018: BUN 10; Creatinine, Ser 0.81  Lab Results  Component Value Date   GFRAA >60 09/15/2018   GFRNONAA >60 09/15/2018   Hepatic: 08/13/2018: Albumin 4.3 Lab Results  Component Value Date   AST 25 08/13/2018   ALT 17 08/13/2018   Other: 03/16/2018: 25-Hydroxy, Vitamin D 20; 25-Hydroxy, Vitamin D-2 4.7; 25-Hydroxy, Vitamin D-3 15; CRP 6; Sed Rate 20; Vitamin B-12 521 Note: Above Lab results reviewed.  Imaging  Last 90 days:  Dg Shoulder Right  Result Date: 09/13/2018 CLINICAL DATA:  Pt has had multiple falls recently and has also been seen at [facility]: RIGHT SHOULDER - 2+ VIEW COMPARISON:  09/11/2018 FINDINGS: There is no evidence of fracture or dislocation. There is no evidence of arthropathy or other focal bone abnormality. Soft tissues are unremarkable. IMPRESSION: Negative. Electronically Signed   By: Nolon Nations M.D.   On: 09/13/2018 15:01   Dg Elbow Complete Right  Result Date: 09/13/2018 CLINICAL DATA:  Pt has had multiple falls recently and has also been seen at [facility] at right knee anterior, bruise at anterior right shoulder, pain right elbow and right hip EXAM: RIGHT ELBOW - COMPLETE 3+ VIEW COMPARISON:  05/12/2008 FINDINGS: There is no evidence of fracture, dislocation, or joint effusion. There is no evidence of arthropathy or other focal bone abnormality. Soft tissues are unremarkable. IMPRESSION: Negative. Electronically Signed   By: Nolon Nations M.D.   On: 09/13/2018 15:02   Ct Head Wo Contrast  Result Date: 09/13/2018 CLINICAL DATA:  Status post fall today.   Initial encounter. EXAM: CT HEAD WITHOUT CONTRAST CT CERVICAL SPINE WITHOUT CONTRAST TECHNIQUE: Multidetector CT imaging of the head and cervical spine was performed following the standard protocol without intravenous contrast. Multiplanar CT image reconstructions of the cervical spine were also generated. COMPARISON:  Head CT scan 09/11/2018. Brain MRI 06/04/2017. MRI cervical spine 03/18/2014. FINDINGS: CT HEAD FINDINGS Brain: No evidence of acute infarction, hemorrhage, hydrocephalus, extra-axial collection or mass lesion/mass effect. Vascular: No hyperdense vessel or unexpected calcification. Skull: Intact.  No focal lesion. Sinuses/Orbits: Small mucous retention cyst or polyp  inferior aspect of the right maxillary sinus. Otherwise negative. Other: None. CT CERVICAL SPINE FINDINGS Alignment: Maintained with straightening of lordosis noted, unchanged. Skull base and vertebrae: No acute fracture. No primary bone lesion or focal pathologic process. Soft tissues and spinal canal: No prevertebral fluid or swelling. No visible canal hematoma. Disc levels: Large disc bulges with endplate spurring are seen at C5-6 and C6-7. The appearance is not grossly changed compared to the prior MRI. Upper chest: Negative. Other: None. IMPRESSION: No acute abnormality head or cervical spine. Chronic degenerative disc disease C5-6 and C6-7. Electronically Signed   By: Inge Rise M.D.   On: 09/13/2018 14:55   Ct Cervical Spine Wo Contrast  Result Date: 09/13/2018 CLINICAL DATA:  Status post fall today.  Initial encounter. EXAM: CT HEAD WITHOUT CONTRAST CT CERVICAL SPINE WITHOUT CONTRAST TECHNIQUE: Multidetector CT imaging of the head and cervical spine was performed following the standard protocol without intravenous contrast. Multiplanar CT image reconstructions of the cervical spine were also generated. COMPARISON:  Head CT scan 09/11/2018. Brain MRI 06/04/2017. MRI cervical spine 03/18/2014. FINDINGS: CT HEAD FINDINGS  Brain: No evidence of acute infarction, hemorrhage, hydrocephalus, extra-axial collection or mass lesion/mass effect. Vascular: No hyperdense vessel or unexpected calcification. Skull: Intact.  No focal lesion. Sinuses/Orbits: Small mucous retention cyst or polyp inferior aspect of the right maxillary sinus. Otherwise negative. Other: None. CT CERVICAL SPINE FINDINGS Alignment: Maintained with straightening of lordosis noted, unchanged. Skull base and vertebrae: No acute fracture. No primary bone lesion or focal pathologic process. Soft tissues and spinal canal: No prevertebral fluid or swelling. No visible canal hematoma. Disc levels: Large disc bulges with endplate spurring are seen at C5-6 and C6-7. The appearance is not grossly changed compared to the prior MRI. Upper chest: Negative. Other: None. IMPRESSION: No acute abnormality head or cervical spine. Chronic degenerative disc disease C5-6 and C6-7. Electronically Signed   By: Inge Rise M.D.   On: 09/13/2018 14:55   Dg Knee Complete 4 Views Right  Result Date: 09/13/2018 CLINICAL DATA:  Right knee pain after fall today. Initial encounter. EXAM: RIGHT KNEE - COMPLETE 4+ VIEW COMPARISON:  Plain films right knee 04/30/2014. FINDINGS: No evidence of fracture, dislocation, or joint effusion. No evidence of arthropathy or other focal bone abnormality. Soft tissues are unremarkable. IMPRESSION: Negative exam. Electronically Signed   By: Inge Rise M.D.   On: 09/13/2018 15:02   Dg Pain Clinic C-arm 1-60 Min No Report  Result Date: 07/09/2018 Fluoro was used, but no Radiologist interpretation will be provided. Please refer to "NOTES" tab for provider progress note.  Dg Hip Unilat W Or Wo Pelvis 2-3 Views Right  Result Date: 09/13/2018 CLINICAL DATA:  Pain status post fall. EXAM: DG HIP (WITH OR WITHOUT PELVIS) 2-3V RIGHT COMPARISON:  None. FINDINGS: There is no evidence of hip fracture or dislocation. There is no evidence of arthropathy or other  focal bone abnormality. IMPRESSION: Negative. Electronically Signed   By: Fidela Salisbury M.D.   On: 09/13/2018 15:01   Assessment  The primary encounter diagnosis was Chronic pain syndrome. Diagnoses of Chronic neck pain (Primary Area of Pain) (Bilateral) (R>L), Chronic low back pain (Secondary Area of Pain) (Bilateral) (L>R), Chronic hand pain (Tertiary Area of Pain) (Bilateral) (L>R), Chronic foot/toes pain (Fourth Area of Pain) (Bilateral) (R>L), Chronic knee pain (Fifth Area of Pain) (Left), Chronic CNS demyelinating disease (MS), Multiple sclerosis ([place]), Open wound of abdominal wall, anterior, complicated, sequela, Postoperative wound infection, Neurogenic pain, and Chronic musculoskeletal pain  were also pertinent to this visit.  Plan of Care  I have changed Emily [name]'s pregabalin and methocarbamol. I am also having her maintain her EPINEPHrine, albuterol, Multiple Vitamins-Minerals (MULTIVITAMIN ADULT PO), amLODipine, etonogestrel, loratadine, Fingolimod HCl, topiramate, divalproex, NON FORMULARY, omeprazole, dronabinol, and Aimovig.  Pharmacotherapy (Medications Ordered): Meds ordered this encounter  Medications  . pregabalin (LYRICA) 300 MG capsule    Sig: Take 1 capsule (300 mg total) by mouth at bedtime.    Dispense:  30 capsule    Refill:  5    Fill one day early if pharmacy is closed on scheduled refill date. May substitute for generic if available.  . methocarbamol (ROBAXIN) 750 MG tablet    Sig: Take 1 tablet (750 mg total) by mouth 2 (two) times daily as needed for muscle spasms.    Dispense:  60 tablet    Refill:  5    Fill one day early if pharmacy is closed on scheduled refill date. May substitute for generic if available.   Orders:  No orders of the defined types were placed in this encounter.  Follow-up plan:   Return in about 23 days (around 10/14/2018) for (F2F) evaluation of abdominal wound before clearing her for RFA.      Interventional management  options: Considering:   NOTE: Hold until complete closure and resolution of abdominal wound infection.  Diagnosticmidline thoracic ESI Diagnosticbilateral thoracic facet block  Possiblebilateral thoracic RFA Diagnostic right sided LESI Therapeutic bilateral lumbar facet RFA #1  Diagnosticleft IA knee injection Diagnosticleft knee genicular NB Possibleleft genicular nerve RFA  Diagnosticright-sided CESI Diagnosticright-sided cervical facet block Diagnosticright-sided cervical facet RFA Diagnosticright occipital NB   Palliative PRN treatment(s):   NOTE: No procedures will be scheduled until the patient fully heals from her infected abdominal wound. Palliative bilateral lumbar facet block #3     Recent Visits Date Type Provider Dept  08/25/18 Procedure visit Milinda Pointer, MD [facility]  08/11/18 Office Visit Milinda Pointer, MD [facility]  07/09/18 Procedure visit Milinda Pointer, MD [facility]  06/24/18 Office Visit Milinda Pointer, MD [facility]  Showing recent visits within past 90 days and meeting all other requirements   Today's Visits Date Type Provider Dept  09/21/18 Office Visit Milinda Pointer, MD [facility]  Showing today's visits and meeting all other requirements   Future Appointments No visits were found meeting these conditions.  Showing future appointments within next 90 days and meeting all other requirements   I discussed the assessment and treatment plan with the patient. The patient was provided an opportunity to ask questions and all were answered. The patient agreed with the plan and demonstrated an understanding of the instructions.  Patient advised to call back or seek an in-person evaluation if the symptoms or condition worsens.  Total duration of non-face-to-face encounter: 15 minutes.  Note by: Gaspar Cola, MD Date: 09/21/2018; Time: 11:38 AM  Note:  This dictation was prepared with Dragon dictation. Any transcriptional errors that may result from this process are unintentional.  Disclaimer:  * Given the special circumstances of the COVID-19 pandemic, the federal government has announced that the Office for Civil Rights (OCR) will exercise its enforcement discretion and will not impose penalties on physicians using telehealth in the event of noncompliance with regulatory requirements under the [place] (HIPAA) in connection with the good faith provision of telehealth during the XX123456 national public health emergency. ([place])

## 2018-09-21 ENCOUNTER — Telehealth (HOSPITAL_COMMUNITY): Payer: Self-pay

## 2018-09-21 ENCOUNTER — Other Ambulatory Visit: Payer: Self-pay

## 2018-09-21 ENCOUNTER — Ambulatory Visit: Payer: Medicare Other | Attending: Pain Medicine | Admitting: Pain Medicine

## 2018-09-21 DIAGNOSIS — M79641 Pain in right hand: Secondary | ICD-10-CM | POA: Diagnosis not present

## 2018-09-21 DIAGNOSIS — G894 Chronic pain syndrome: Secondary | ICD-10-CM | POA: Diagnosis not present

## 2018-09-21 DIAGNOSIS — M79674 Pain in right toe(s): Secondary | ICD-10-CM

## 2018-09-21 DIAGNOSIS — M792 Neuralgia and neuritis, unspecified: Secondary | ICD-10-CM

## 2018-09-21 DIAGNOSIS — G379 Demyelinating disease of central nervous system, unspecified: Secondary | ICD-10-CM

## 2018-09-21 DIAGNOSIS — T8149XA Infection following a procedure, other surgical site, initial encounter: Secondary | ICD-10-CM

## 2018-09-21 DIAGNOSIS — G8929 Other chronic pain: Secondary | ICD-10-CM

## 2018-09-21 DIAGNOSIS — G35 Multiple sclerosis: Secondary | ICD-10-CM

## 2018-09-21 DIAGNOSIS — M5442 Lumbago with sciatica, left side: Secondary | ICD-10-CM | POA: Diagnosis not present

## 2018-09-21 DIAGNOSIS — S31109S Unspecified open wound of abdominal wall, unspecified quadrant without penetration into peritoneal cavity, sequela: Secondary | ICD-10-CM

## 2018-09-21 DIAGNOSIS — M25562 Pain in left knee: Secondary | ICD-10-CM

## 2018-09-21 DIAGNOSIS — M79642 Pain in left hand: Secondary | ICD-10-CM

## 2018-09-21 DIAGNOSIS — M79675 Pain in left toe(s): Secondary | ICD-10-CM

## 2018-09-21 DIAGNOSIS — M542 Cervicalgia: Secondary | ICD-10-CM | POA: Diagnosis not present

## 2018-09-21 DIAGNOSIS — M7918 Myalgia, other site: Secondary | ICD-10-CM | POA: Diagnosis not present

## 2018-09-21 DIAGNOSIS — M5441 Lumbago with sciatica, right side: Secondary | ICD-10-CM

## 2018-09-21 MED ORDER — PREGABALIN 300 MG PO CAPS: 300.0000 mg | ORAL_CAPSULE | Freq: Every day | ORAL | 5 refills | Status: DC

## 2018-09-21 MED ORDER — METHOCARBAMOL 750 MG PO TABS: 750.0000 mg | ORAL_TABLET | Freq: Two times a day (BID) | ORAL | 5 refills | Status: DC | PRN

## 2018-09-21 NOTE — Telephone Encounter (Signed)
Patient is calling to let you know that she got her results from the ADHD testing back and she does have ADHD. Dr. Glennon Hamilton sent you the report via e-mail. It has been recommended that she take a immediate release and not extended release. Please review and advise, thank you

## 2018-09-22 ENCOUNTER — Encounter (HOSPITAL_COMMUNITY): Payer: Self-pay

## 2018-09-22 ENCOUNTER — Other Ambulatory Visit: Payer: Self-pay

## 2018-09-22 ENCOUNTER — Ambulatory Visit (HOSPITAL_COMMUNITY): Admission: RE | Admit: 2018-09-22 | Payer: Medicare Other | Source: Ambulatory Visit

## 2018-09-22 ENCOUNTER — Emergency Department (HOSPITAL_COMMUNITY)
Admission: EM | Admit: 2018-09-22 | Discharge: 2018-09-22 | Disposition: A | Discharge status: Home / Self Care | Payer: Medicare Other | Attending: Emergency Medicine | Admitting: Emergency Medicine

## 2018-09-22 DIAGNOSIS — Z87891 Personal history of nicotine dependence: Secondary | ICD-10-CM | POA: Diagnosis not present

## 2018-09-22 DIAGNOSIS — J45909 Unspecified asthma, uncomplicated: Secondary | ICD-10-CM | POA: Insufficient documentation

## 2018-09-22 DIAGNOSIS — I10 Essential (primary) hypertension: Secondary | ICD-10-CM | POA: Insufficient documentation

## 2018-09-22 DIAGNOSIS — M79604 Pain in right leg: Secondary | ICD-10-CM | POA: Diagnosis not present

## 2018-09-22 DIAGNOSIS — Z79899 Other long term (current) drug therapy: Secondary | ICD-10-CM | POA: Diagnosis not present

## 2018-09-22 DIAGNOSIS — G35 Multiple sclerosis: Secondary | ICD-10-CM | POA: Diagnosis not present

## 2018-09-22 DIAGNOSIS — M79605 Pain in left leg: Secondary | ICD-10-CM | POA: Insufficient documentation

## 2018-09-22 DIAGNOSIS — R531 Weakness: Secondary | ICD-10-CM | POA: Diagnosis not present

## 2018-09-22 MED ORDER — KETAMINE HCL 10 MG/ML IJ SOLN
0.3000 mg/kg | Freq: Once | INTRAMUSCULAR | Status: AC
  Administered 2018-09-22: 35 mg via INTRAVENOUS
  Filled 2018-09-22: qty 1

## 2018-09-22 NOTE — Telephone Encounter (Signed)
Spoke with pt. She was recently at [facility]. States that she was given Ketamine and all of her symptoms have improved. She would like to make Dr. Delice Lesch aware of this.  MRI under anesthesia has been scheduled for 10/13/18. Pt needs to arrive at [place] in admitting at [facility]. She will need to be NPO starting at midnight. Pt made aware of all.  Pt also made aware that she is scheduled for a COVID test at the tent located at the old [facility] on [address] at 12:30.  Per Aniceto Boss pt needs to be seen within 30 days prior to the MRI. A virtual visit has been scheduled for 10/07/18 at 4 with [place]. The OV note needs to be faxed Attn: Tasha at [phone]

## 2018-09-22 NOTE — Discharge Instructions (Addendum)
Please follow-up with your neurologist or pain management specialist for ongoing management of your pain symptoms.

## 2018-09-22 NOTE — Telephone Encounter (Signed)
Patient left msg with after hours about mri and about having pain in her feet up to her legs and she is unable to move them. She has MS. Thanks!

## 2018-09-22 NOTE — ED Triage Notes (Signed)
Pt history of MS. States she can't move either legs. She can move her toes, can feel EMS touching her legs, as well as stand up and pivot. EMS saw visible movement from both legs on transport to facility.

## 2018-09-22 NOTE — ED Provider Notes (Signed)
[facility] EMERGENCY DEPARTMENT Provider Note   CSN: [id number] Arrival date & time: 09/22/18  0654     History   Chief Complaint Chief Complaint  Patient presents with  . Extremity Weakness    HPI Emily [name] is a 38 y.o. female with past medical history significant for multiple sclerosis, chronic pain syndrome, and conversion disorder who presents to the ER via EMS for complaints of acute onset bilateral leg pain and paralysis.  She states that she woke up this morning at approximately 5 AM and was immediately met by sharp, stabbing pains from her feet to her hips bilaterally.  She also reports inability to move her legs and is concerned that it is due to her MS. She states that her sensation is intact. She had her Robaxin and Lyrica increased by her pain specialist yesterday and she reports taking those medications as directed. EMS reports seeing physical movement from both legs on transport to facility.  Patient denies any recent trauma, fevers, chills, recent illness, blurred vision, headache, chest pain, shortness of breath, or any additional neurologic deficits.     HPI  Past Medical History:  Diagnosis Date  . Anxiety   . Asthma as a child  . Chronic back pain   . Chronic pain   . Conversion disorder   . DDD (degenerative disc disease), cervical   . Depression   . External hemorrhoid   . Fatty liver   . GERD (gastroesophageal reflux disease)   . History of kidney stones   . HTN (hypertension) 02/27/2015  . JC virus antibody positive   . Migraines   . MS (multiple sclerosis) ([place])    06-2006  . Neuropathy   . OA (osteoarthritis)   . Ovarian cyst   . Panic attack   . Perforated bowel ([place]) 2009   colostomy bag for 3 motnhs  . 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 PTSD (post-traumatic stress disorder)   . S/P emergency C-section   . Seasonal allergies   . Urinary urgency   . Vertigo   . Wears contact lenses   . Wears  glasses     Patient Active Problem List   Diagnosis Date Noted  . Open wound of abdominal wall, anterior, complicated, sequela 89[birth year]1941  . Postoperative wound infection 09/20/2018  . Acute postoperative pain 08/25/2018  . History of substance use disorder 07/19/2018  . Chronic nausea 04/13/2018  . Nausea with vomiting 03/23/2018  . Pharmacologic therapy 03/16/2018  . Disorder of skeletal system 03/16/2018  . Problems influencing health status 03/16/2018  . Abnormal MRI, cervical spine (03/06/17) 03/16/2018  . Morbid obesity with BMI of 45.0-49.9, adult ([place]) 03/16/2018  . Chronic pain of both knees 02/18/2018  . Cervical facet hypertrophy 04/29/2017  . Cervical facet syndrome (Bilateral) (R>L) 04/29/2017  . Spondylosis without myelopathy or radiculopathy, cervical region 04/29/2017  . Spondylosis without myelopathy or radiculopathy, lumbosacral region 03/04/2017  . Cervicogenic headache 02/10/2017  . Occipital headache 02/10/2017  . Trigger point with back pain (Left) 02/10/2017  . History of fainting (vasovagal) 02/10/2017  . History of vasovagal syncope 02/10/2017  . Acute left-sided low back pain without sciatica 02/10/2017  . Neurogenic pain 01/23/2017  . Chronic musculoskeletal pain 12/02/2016  . Cervical radiculitis (Bilateral) 11/12/2016  . Chronic upper back pain (Primary Area of Pain) (Bilateral) (R>L) 11/01/2016  . Chronic hand pain [facility] Area of Pain) (Bilateral) (L>R) 11/01/2016  . Chronic wrist pain [facility] Area  of Pain) (Bilateral) (L>R) 11/01/2016  . Chronic foot/toes pain (Fourth Area of Pain) (Bilateral) (R>L) 11/01/2016  . Chronic upper extremity pain (Secondary Area of Pain) (Bilateral) (L>R) 11/01/2016  . Long term prescription benzodiazepine use 11/01/2016  . 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 Cervical foraminal stenosis (Bilateral) (C6-7) 11/01/2016  . 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 DDD  (degenerative disc disease), thoracic 11/01/2016  . DDD (degenerative disc disease), lumbar 11/01/2016  . Lumbar facet arthropathy 11/01/2016  . Lumbar facet syndrome (Bilateral) (R>L) 11/01/2016  . Vitamin D deficiency 10/28/2016  . Opiate use 07/23/2016  . Chronic neck pain (Primary Area of Pain) (Bilateral) (R>L) 07/23/2016  . Chronic low back pain (Secondary Area of Pain) (Bilateral) (L>R) 07/23/2016  . Chronic knee pain (Fifth Area of Pain) (Left) 07/23/2016  . Midline thoracic back pain 07/23/2016  . Chronic lower extremity pain (Bilateral) 07/23/2016  . Incarcerated incisional hernia 02/16/2016  . Incisional hernia 02/15/2016  . Carpal tunnel syndrome [facility] Area of Pain) (Bilateral) (L>R) 11/13/2015  . Substance abuse ([place]) 09/07/2015  . Paresthesia 08/02/2015  . Chronic pain syndrome 08/02/2015  . Postpartum endometritis 07/21/2015  . PTSD (post-traumatic stress disorder) 05/16/2015  . Conversion disorder with seizures or convulsions 05/16/2015  . Severe episode of recurrent major depressive disorder, without psychotic features ([place]) 05/16/2015  . GAD (generalized anxiety disorder) 05/16/2015  . Difficult intravenous access 05/08/2015  . Right optic neuritis 04/27/2015  . Conversion disorder with attacks or seizures, persistent, with psychological stressor 03/28/2015  . Migraine with aura and without status migrainosus, not intractable 03/28/2015  . Generalized anxiety disorder 03/28/2015  . HTN (hypertension) 02/27/2015  . Perforated bowel ([place]) 02/27/2015  . Depression 12/27/2014  . Multiple sclerosis ([place]) 12/27/2014  . Migraine 12/27/2014  . Seizure disorder ([place]) 12/27/2014  . Smoker 12/27/2014  . Obesity 12/27/2014  . DDD (degenerative disc disease), cervical 07/28/2014  . Major depressive disorder, single episode 11/02/2013  . Laryngopharyngeal reflux 10/25/2010  . Tonsillitis 10/25/2010    Past Surgical History:  Procedure Laterality Date  . ABDOMINAL  SURGERY    . APPENDECTOMY    . BOWEL RESECTION  01/2007   with colostomy  . CESAREAN SECTION N/A 07/12/2015   Procedure: CESAREAN SECTION;  Surgeon: Guss Bunde, MD;  Location: [facility];  Service: Obstetrics;  Laterality: N/A;  . COLONOSCOPY WITH PROPOFOL N/A 01/29/2017   Procedure: COLONOSCOPY WITH PROPOFOL;  Surgeon: Ileana Roup, MD;  Location: [facility];  Service: General;  Laterality: N/A;  . COLOSTOMY CLOSURE  04/2007  . EVALUATION UNDER ANESTHESIA WITH HEMORRHOIDECTOMY N/A 06/11/2018   Procedure: ANORECTAL EXAM UNDER ANESTHESIA WITH HEMORRHOIDECTOMY;  Surgeon: Ileana Roup, MD;  Location: [facility];  Service: General;  Laterality: N/A;  . EXCISIONAL HEMORRHOIDECTOMY    . EXTREMITY CYST EXCISION  1994   right leg  . HERNIA REPAIR    . INCISIONAL HERNIA REPAIR N/A 02/16/2016   Procedure: HERNIA REPAIR INCISIONAL;  Surgeon: Fanny Skates, MD;  Location: [facility];  Service: General;  Laterality: N/A;  . INSERTION OF MESH  02/16/2016   Procedure: INSERTION OF MESH;  Surgeon: Fanny Skates, MD;  Location: [facility];  Service: General;;  . RADIOLOGY WITH ANESTHESIA N/A 03/06/2017   Procedure: MRI WITH ANESTHESIA OF CERVICAL SPINE WITHOUT CONTRAST, MRI OF LUMBAR SPINE WITHOUT CONTRAST;  Surgeon: Radiologist, Medication, MD;  Location: [place];  Service: Radiology;  Laterality: N/A;  . RADIOLOGY WITH ANESTHESIA N/A 09/15/2018  Procedure: MRI OF BRAIN WITH AND WITHOUT CONTRAST;  Surgeon: Radiologist, Medication, MD;  Location: [place];  Service: Radiology;  Laterality: N/A;  . SCAR REVISION  01/21/2011   Procedure: SCAR REVISION;  Surgeon: Hermelinda Dellen;  Location: [facility];  Service: Plastics;  Laterality: N/A;  exploration of scar of abdomen and repair of defect  . TOOTH EXTRACTION Left 10/2016  . TRANSRECTAL DRAINAGE OF PELVIC ABSCESS       OB History    Gravida  1   Para  1   Term      Preterm  1   AB      Living  1      SAB      TAB      Ectopic      Multiple  0   Live Births  1            Home Medications    Prior to Admission medications   Medication Sig Start Date End Date Taking? Authorizing Provider  AIMOVIG 140 MG/ML SOAJ Inject 140 mg into the muscle every 30 (thirty) days.  08/21/18   [provider]  albuterol (PROVENTIL HFA;VENTOLIN HFA) 108 (90 Base) MCG/ACT inhaler Inhale 2 puffs into the lungs every 6 (six) hours as needed for wheezing or shortness of breath. Patient taking differently: Inhale 2 puffs into the lungs every 4 (four) hours as needed for wheezing or shortness of breath.  02/15/15   Jaclyn Prime, Collene Leyden, PA-C  amLODipine (NORVASC) 5 MG tablet Take 5 mg by mouth every morning.  01/23/16   [provider]  busPIRone (BUSPAR) 15 MG tablet Take 1 tablet (15 mg total) by mouth 3 (three) times daily. 09/17/18   Charlcie Cradle, MD  diazepam (VALIUM) 5 MG tablet Take 1 tablet (5 mg total) by mouth 3 (three) times daily as needed for anxiety. 09/17/18   Charlcie Cradle, MD  divalproex (DEPAKOTE) 500 MG DR tablet Take 1 tablet (500 mg total) by mouth at bedtime. 05/05/18   Cameron Sprang, MD  dronabinol (MARINOL) 2.5 MG capsule Take 1 capsule (2.5 mg total) by mouth 3 (three) times daily before meals. 07/06/18   Annitta Needs, NP  EPINEPHrine (EPIPEN) 0.3 mg/0.3 mL SOAJ injection Inject 0.3 mg into the muscle as needed (allergic reaction). Reported on 03/28/2015    [provider]  etonogestrel (NEXPLANON) 68 MG IMPL implant 1 each by Subdermal route once.    [provider]  Fingolimod HCl (GILENYA) 0.5 MG CAPS Take 1 capsule (0.5 mg total) by mouth daily. Patient taking differently: Take 0.5 mg by mouth every morning.  05/05/18   Cameron Sprang, MD  FLUoxetine (PROZAC) 40 MG capsule Take 2 capsules (80 mg total) by mouth every morning. 09/17/18 09/17/19  Charlcie Cradle, MD  haloperidol (HALDOL) 1 MG tablet Take 1 tablet (1 mg total) by mouth 2 (two)  times daily. 09/17/18   Charlcie Cradle, MD  lithium carbonate (LITHOBID) 300 MG CR tablet Take 2 tablets (600 mg total) by mouth 2 (two) times daily. 09/17/18   Charlcie Cradle, MD  loratadine (CLARITIN) 10 MG tablet Take 10 mg by mouth daily as needed for allergies.    [provider]  methocarbamol (ROBAXIN) 750 MG tablet Take 1 tablet (750 mg total) by mouth 2 (two) times daily as needed for muscle spasms. 09/21/18 03/20/19  Milinda Pointer, MD  Multiple Vitamins-Minerals (MULTIVITAMIN ADULT PO) Take 1 tablet by mouth daily.  [provider]  NON FORMULARY Take 1 each by mouth at bedtime. CBD Gummy    [provider]  omeprazole (PRILOSEC) 20 MG capsule TAKE 1 CAPSULE (20 MG TOTAL) BY MOUTH 2 (TWO) TIMES DAILY BEFORE A MEAL. 06/18/18   Annitta Needs, NP  pregabalin (LYRICA) 300 MG capsule Take 1 capsule (300 mg total) by mouth at bedtime. 09/21/18 03/20/19  Milinda Pointer, MD  topiramate (TOPAMAX) 100 MG tablet Take 1.5 tablets twice a day 05/05/18   Cameron Sprang, MD    Family History Family History  Problem Relation Age of Onset  . Diabetes Mother   . Hypertension Mother   . Diabetes Father   . Hypertension Father   . Arthritis Father   . Cancer Maternal Grandmother   . Cancer Maternal Grandfather   . Alcohol abuse Neg Hx   . Anxiety disorder Neg Hx   . Bipolar disorder Neg Hx   . Drug abuse Neg Hx   . Depression Neg Hx   . Colon cancer Neg Hx     Social History Social History   Tobacco Use  . Smoking status: Former Smoker    Packs/day: 0.25    Years: 10.00    Pack years: 2.50    Types: Cigarettes    Quit date: 07/12/2015    Years since quitting: 3.2  . Smokeless tobacco: Never Used  Substance Use Topics  . Alcohol use: No    Alcohol/week: 0.0 standard drinks    Comment: Rare  . Drug use: No     Allergies   Amitriptyline, Baclofen, Duloxetine hcl, Gabapentin, Hydrocodone-acetaminophen, Magnesium salicylate, Modafinil, Monosodium  glutamate, Other, Tizanidine, Alprazolam, Rizatriptan, Vicodin [hydrocodone-acetaminophen], Nsaids, Adhesive [tape], Ketorolac tromethamine, Lamotrigine, and Tramadol   Review of Systems Review of Systems  All other systems reviewed and are negative.     Physical Exam Updated Vital Signs BP (!) 152/104 (BP Location: Right Arm)   Pulse 79   Temp 98.2 F (36.8 C) (Oral)   Resp 18   Ht 5' 5" (1.651 m)   Wt 117.9 kg   LMP 09/15/2018   SpO2 98%   BMI 43.27 kg/m   Physical Exam Constitutional:      Appearance: Normal appearance.  HENT:     Head: Normocephalic and atraumatic.  Eyes:     General: No scleral icterus.    Conjunctiva/sclera: Conjunctivae normal.  Cardiovascular:     Rate and Rhythm: Normal rate and regular rhythm.     Pulses: Normal pulses.     Heart sounds: Normal heart sounds.  Pulmonary:     Effort: Pulmonary effort is normal.     Breath sounds: Normal breath sounds.  Abdominal:     Palpations: Abdomen is soft.     Tenderness: There is no abdominal tenderness. There is no guarding.  Musculoskeletal:     Comments: Legs TTP bilaterally. Patient was able to swing legs and flex at hips when asked. Movement observed in ankles and toes.  Skin:    Comments: No evidence of erythema, swelling, discoloration, or trauma.  Neurological:     Mental Status: She is alert and oriented to person, place, and time. Mental status is at baseline.     GCS: GCS eye subscore is 4. GCS verbal subscore is 5. GCS motor subscore is 6.     Cranial Nerves: No cranial nerve deficit.     Sensory: No sensory deficit.     Comments: Sensation and reflexes intact bilaterally.  Psychiatric:  Mood and Affect: Mood normal.        Behavior: Behavior normal.        Thought Content: Thought content normal.      ED Treatments / Results  Labs (all labs ordered are listed, but only abnormal results are displayed) Labs Reviewed - No data to display  EKG None  Radiology No  results found.  Procedures Procedures (including critical care time)  Medications Ordered in ED Medications  ketamine (KETALAR) injection 35 mg (has no administration in time range)     Initial Impression / Assessment and Plan / ED Course  I have reviewed the triage vital signs and the nursing notes.  Pertinent labs & imaging results that were available during my care of the patient were reviewed by me and considered in my medical decision making (see chart for details).        Patient reports decreased pain with extended time in assisted bilateral dorsiflexion suggesting muscle spasms, which is consistent with her history and physical exam. Provided pain-dosed ketamine here in the ED for her reported discomfort. In accordance to her ED care plan, will avoid neuroimaging given absence of new or objective neurologic findings and will instead discharge her with instruction to follow-up with her neurologist and or pain medicine specialists for further management. Laboratory work-up not warranted. Patient voiced understanding and is agreeable to plan.  Do not suspect cauda equina as no preceding trauma and denies saddle anesthesia or incontinence. Pulses intact and legs are warm. Reflexes are intact. Neurological exam is very reassuring. She is able to answer questions appropriately with good mentation. Patient admits that her strength is limited by pain rather than by true weakness. This is an acute on chronic issue involving spasms and discomfort related to her MS. Return precautions provided. Patient pain improved at time of discharge.    Final Clinical Impressions(s) / ED Diagnoses   Final diagnoses:  Bilateral leg pain    ED Discharge Orders    None       Corena Herter, PA-C 09/22/18 0850    Noemi Chapel, MD 10/01/18 [phone]

## 2018-09-22 NOTE — ED Provider Notes (Signed)
This patient who is well-known to the emergency department comes back in today for complaint of bilateral leg pain.  She reports that she has chronic multiple sclerosis which is confirmed in the chart after multiple records have been reviewed.  She comes by ambulance stating that she woke up this morning with pain in her bilateral calves.  She reports that this is an increased pain from baseline.  She was seen yesterday by her pain management specialist and had her Robaxin increased to 750 mg twice a day as well as her Lyrica increased to 300 mg at night.  She reports that she did take those doses yesterday.  On my exam the patient does not fact have a normal affect, she is interactive, she tells me about getting activity at home by chasing her 40-year-old daughter around the house and pushing her on her tricycle.  She reports that the pain is in her bilateral calves and on my exam she does not fact have tenderness with mild palpation to the bilateral calves which seem to have some muscle contractions and spasm.  When I put the calves into extension by stretching by dorsiflexing the feet bilaterally it causes immediate pain and tension which gradually resolves with continued stretch.  I suspect that the patient has muscle spasms in her bilateral calves, she otherwise appears well is interactive and has normal strength above the waist.  She is able to assist with moving on and off the bed as she changes position and clearly has strength in her legs.  She is followed very closely by neurology and can continue her outpatient follow-up.  We will give her pain dose ketamine and anticipate discharge.  I do not see any need for aggressive neurologic imaging or work-up or laboratory work-up at this time.  Patient is agreeable to the plan  Medical screening examination/treatment/procedure(s) were conducted as a shared visit with non-physician practitioner(s) and myself.  I personally evaluated the patient during the  encounter.  Clinical Impression:   Final diagnoses:  Bilateral leg pain         Noemi Chapel, MD 10/01/18 [phone]

## 2018-09-23 ENCOUNTER — Telehealth: Payer: Self-pay | Admitting: Neurology

## 2018-09-23 NOTE — Telephone Encounter (Signed)
Patient is calling in to let you know she was in [facility] yesterday morning. EMS came to pick her up at 6AM. She said to review the notes. She couldn't move her legs because they hurts so bad. Claudomine medication through IV is what they gave her and it helped her so much she said.Thanks!

## 2018-09-23 NOTE — Telephone Encounter (Signed)
Noted  

## 2018-09-24 ENCOUNTER — Telehealth: Payer: Self-pay | Admitting: Neurology

## 2018-09-24 ENCOUNTER — Emergency Department (HOSPITAL_COMMUNITY)
Admission: EM | Admit: 2018-09-24 | Discharge: 2018-09-24 | Disposition: A | Discharge status: Home / Self Care | Payer: Medicare Other | Attending: Emergency Medicine | Admitting: Emergency Medicine

## 2018-09-24 ENCOUNTER — Other Ambulatory Visit: Payer: Self-pay

## 2018-09-24 ENCOUNTER — Encounter (HOSPITAL_COMMUNITY): Payer: Self-pay | Admitting: Emergency Medicine

## 2018-09-24 DIAGNOSIS — Z87891 Personal history of nicotine dependence: Secondary | ICD-10-CM | POA: Insufficient documentation

## 2018-09-24 DIAGNOSIS — Z79899 Other long term (current) drug therapy: Secondary | ICD-10-CM | POA: Insufficient documentation

## 2018-09-24 DIAGNOSIS — M79604 Pain in right leg: Secondary | ICD-10-CM | POA: Insufficient documentation

## 2018-09-24 DIAGNOSIS — M79605 Pain in left leg: Secondary | ICD-10-CM | POA: Insufficient documentation

## 2018-09-24 DIAGNOSIS — I959 Hypotension, unspecified: Secondary | ICD-10-CM | POA: Diagnosis not present

## 2018-09-24 DIAGNOSIS — I10 Essential (primary) hypertension: Secondary | ICD-10-CM | POA: Diagnosis not present

## 2018-09-24 DIAGNOSIS — J45909 Unspecified asthma, uncomplicated: Secondary | ICD-10-CM | POA: Diagnosis not present

## 2018-09-24 MED ORDER — KETAMINE HCL 10 MG/ML IJ SOLN
0.3000 mg/kg | Freq: Once | INTRAMUSCULAR | Status: AC
  Administered 2018-09-24: 35 mg via INTRAVENOUS
  Filled 2018-09-24: qty 1

## 2018-09-24 NOTE — Telephone Encounter (Signed)
Patient is calling in again for nurse. Thanks!

## 2018-09-24 NOTE — ED Triage Notes (Signed)
Pt states having left leg pain that radiating to her hip x 1 week. HX of MS

## 2018-09-24 NOTE — Telephone Encounter (Signed)
Per Dr. Tomi Likens needs to keep follow up with [place] and pain management. Does not sound like an MS flare. Sounds like a pain flare because there are no new symptoms.  Pt continues to cry and in pain. Informed that she has an appt scheduled with [place] on 9/30, and appt with pt management on 9/28. There is also an MRI under sedation scheduled. Instructed to called pain management for cancellations. Pt is currently doing that and is on waiting list.

## 2018-09-24 NOTE — ED Provider Notes (Signed)
[facility] EMERGENCY DEPARTMENT Provider Note   CSN: [id number] Arrival date & time: 09/24/18  1605     History   Chief Complaint Chief Complaint  Patient presents with  . Leg Pain    HPI Emily [name] is a 38 y.o. female.     HPI Patient presents with pain in both her legs.  Starts in her feet and goes up to her mid back.  Acute on chronic.  Was seen in the ER for this 2 days ago.  Thought it was worsening chronic pain related to muscle cramps.  Has history of MS but no weakness.  Patient called her neurologist today and I think this is more of a chronic pain issue.  Cannot get into see her neurologist for her chronic pain doctor until later this month.  No fevers.  No trauma.  States she was hurting earlier today and legs got weak so she fell.  States she still has the strength in her legs but sometimes it will get weak. Past Medical History:  Diagnosis Date  . Anxiety   . Asthma as a child  . Chronic back pain   . Chronic pain   . Conversion disorder   . DDD (degenerative disc disease), cervical   . Depression   . External hemorrhoid   . Fatty liver   . GERD (gastroesophageal reflux disease)   . History of kidney stones   . HTN (hypertension) 02/27/2015  . JC virus antibody positive   . Migraines   . MS (multiple sclerosis) ([place])    06-2006  . Neuropathy   . OA (osteoarthritis)   . Ovarian cyst   . Panic attack   . Perforated bowel ([place]) 2009   colostomy bag for 3 motnhs  . 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 PTSD (post-traumatic stress disorder)   . S/P emergency C-section   . Seasonal allergies   . Urinary urgency   . Vertigo   . Wears contact lenses   . Wears glasses     Patient Active Problem List   Diagnosis Date Noted  . Open wound of abdominal wall, anterior, complicated, sequela [id number]  . Postoperative wound infection 09/20/2018  . Acute postoperative pain 08/25/2018  . History of substance use disorder  07/19/2018  . Chronic nausea 04/13/2018  . Nausea with vomiting 03/23/2018  . Pharmacologic therapy 03/16/2018  . Disorder of skeletal system 03/16/2018  . Problems influencing health status 03/16/2018  . Abnormal MRI, cervical spine (03/06/17) 03/16/2018  . Morbid obesity with BMI of 45.0-49.9, adult ([place]) 03/16/2018  . Chronic pain of both knees 02/18/2018  . Cervical facet hypertrophy 04/29/2017  . Cervical facet syndrome (Bilateral) (R>L) 04/29/2017  . Spondylosis without myelopathy or radiculopathy, cervical region 04/29/2017  . Spondylosis without myelopathy or radiculopathy, lumbosacral region 03/04/2017  . Cervicogenic headache 02/10/2017  . Occipital headache 02/10/2017  . Trigger point with back pain (Left) 02/10/2017  . History of fainting (vasovagal) 02/10/2017  . History of vasovagal syncope 02/10/2017  . Acute left-sided low back pain without sciatica 02/10/2017  . Neurogenic pain 01/23/2017  . Chronic musculoskeletal pain 12/02/2016  . Cervical radiculitis (Bilateral) 11/12/2016  . Chronic upper back pain (Primary Area of Pain) (Bilateral) (R>L) 11/01/2016  . Chronic hand pain [facility] Area of Pain) (Bilateral) (L>R) 11/01/2016  . Chronic wrist pain [facility] Area of Pain) (Bilateral) (L>R) 11/01/2016  . Chronic foot/toes pain (Fourth Area of Pain) (Bilateral) (R>L) 11/01/2016  .  Chronic upper extremity pain (Secondary Area of Pain) (Bilateral) (L>R) 11/01/2016  . Long term prescription benzodiazepine use 11/01/2016  . 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 Cervical foraminal stenosis (Bilateral) (C6-7) 11/01/2016  . 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 DDD (degenerative disc disease), thoracic 11/01/2016  . DDD (degenerative disc disease), lumbar 11/01/2016  . Lumbar facet arthropathy 11/01/2016  . Lumbar facet syndrome (Bilateral) (R>L) 11/01/2016  . Vitamin D deficiency 10/28/2016  . Opiate use 07/23/2016  . Chronic neck  pain (Primary Area of Pain) (Bilateral) (R>L) 07/23/2016  . Chronic low back pain (Secondary Area of Pain) (Bilateral) (L>R) 07/23/2016  . Chronic knee pain (Fifth Area of Pain) (Left) 07/23/2016  . Midline thoracic back pain 07/23/2016  . Chronic lower extremity pain (Bilateral) 07/23/2016  . Incarcerated incisional hernia 02/16/2016  . Incisional hernia 02/15/2016  . Carpal tunnel syndrome [facility] Area of Pain) (Bilateral) (L>R) 11/13/2015  . Substance abuse ([place]) 09/07/2015  . Paresthesia 08/02/2015  . Chronic pain syndrome 08/02/2015  . Postpartum endometritis 07/21/2015  . PTSD (post-traumatic stress disorder) 05/16/2015  . Conversion disorder with seizures or convulsions 05/16/2015  . 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 GAD (generalized anxiety disorder) 05/16/2015  . Difficult intravenous access 05/08/2015  . Right optic neuritis 04/27/2015  . Conversion disorder with attacks or seizures, persistent, with psychological stressor 03/28/2015  . Migraine with aura and without status migrainosus, not intractable 03/28/2015  . Generalized anxiety disorder 03/28/2015  . HTN (hypertension) 02/27/2015  . Perforated bowel ([place]) 02/27/2015  . Depression 12/27/2014  . Multiple sclerosis ([place]) 12/27/2014  . Migraine 12/27/2014  . Seizure disorder ([place]) 12/27/2014  . Smoker 12/27/2014  . Obesity 12/27/2014  . DDD (degenerative disc disease), cervical 07/28/2014  . Major depressive disorder, single episode 11/02/2013  . Laryngopharyngeal reflux 10/25/2010  . Tonsillitis 10/25/2010    Past Surgical History:  Procedure Laterality Date  . ABDOMINAL SURGERY    . APPENDECTOMY    . BOWEL RESECTION  01/2007   with colostomy  . CESAREAN SECTION N/A 07/12/2015   Procedure: CESAREAN SECTION;  Surgeon: Guss Bunde, MD;  Location: [place];  Service: Obstetrics;  Laterality: N/A;  . COLONOSCOPY WITH PROPOFOL N/A  01/29/2017   Procedure: COLONOSCOPY WITH PROPOFOL;  Surgeon: Ileana Roup, MD;  Location: [facility];  Service: General;  Laterality: N/A;  . COLOSTOMY CLOSURE  04/2007  . EVALUATION UNDER ANESTHESIA WITH HEMORRHOIDECTOMY N/A 06/11/2018   Procedure: ANORECTAL EXAM UNDER ANESTHESIA WITH HEMORRHOIDECTOMY;  Surgeon: Ileana Roup, MD;  Location: [place];  Service: General;  Laterality: N/A;  . EXCISIONAL HEMORRHOIDECTOMY    . EXTREMITY CYST EXCISION  1994   right leg  . HERNIA REPAIR    . INCISIONAL HERNIA REPAIR N/A 02/16/2016   Procedure: HERNIA REPAIR INCISIONAL;  Surgeon: Fanny Skates, MD;  Location: [facility];  Service: General;  Laterality: N/A;  . INSERTION OF MESH  02/16/2016   Procedure: INSERTION OF MESH;  Surgeon: Fanny Skates, MD;  Location: [facility];  Service: General;;  . RADIOLOGY WITH ANESTHESIA N/A 03/06/2017   Procedure: MRI WITH ANESTHESIA OF CERVICAL SPINE WITHOUT CONTRAST, MRI OF LUMBAR SPINE WITHOUT CONTRAST;  Surgeon: Radiologist, Medication, MD;  Location: [place];  Service: Radiology;  Laterality: N/A;  . RADIOLOGY WITH ANESTHESIA N/A 09/15/2018   Procedure: MRI OF BRAIN WITH AND WITHOUT CONTRAST;  Surgeon: Radiologist, Medication, MD;  Location: [place];  Service:  Radiology;  Laterality: N/A;  . SCAR REVISION  01/21/2011   Procedure: SCAR REVISION;  Surgeon: Hermelinda Dellen;  Location: [place];  Service: Plastics;  Laterality: N/A;  exploration of scar of abdomen and repair of defect  . TOOTH EXTRACTION Left 10/2016  . TRANSRECTAL DRAINAGE OF PELVIC ABSCESS       OB History    Gravida  1   Para  1   Term      Preterm  1   AB      Living  1     SAB      TAB      Ectopic      Multiple  0   Live Births  1            Home Medications    Prior to Admission medications   Medication Sig Start Date End Date Taking? Authorizing Provider  AIMOVIG 140 MG/ML SOAJ Inject 140 mg into the muscle every 30 (thirty)  days.  08/21/18  Yes [provider]  albuterol (PROVENTIL HFA;VENTOLIN HFA) 108 (90 Base) MCG/ACT inhaler Inhale 2 puffs into the lungs every 6 (six) hours as needed for wheezing or shortness of breath. Patient taking differently: Inhale 2 puffs into the lungs every 4 (four) hours as needed for wheezing or shortness of breath.  02/15/15  Yes Teague Bobbye Morton, PA-C  amLODipine (NORVASC) 5 MG tablet Take 5 mg by mouth every morning.  01/23/16  Yes [provider]  busPIRone (BUSPAR) 15 MG tablet Take 1 tablet (15 mg total) by mouth 3 (three) times daily. 09/17/18  Yes Charlcie Cradle, MD  diazepam (VALIUM) 5 MG tablet Take 1 tablet (5 mg total) by mouth 3 (three) times daily as needed for anxiety. Patient taking differently: Take 5 mg by mouth 3 (three) times daily.  09/17/18  Yes Charlcie Cradle, MD  divalproex (DEPAKOTE) 500 MG DR tablet Take 1 tablet (500 mg total) by mouth at bedtime. 05/05/18  Yes Cameron Sprang, MD  dronabinol (MARINOL) 2.5 MG capsule Take 1 capsule (2.5 mg total) by mouth 3 (three) times daily before meals. 07/06/18  Yes Annitta Needs, NP  EPINEPHrine (EPIPEN) 0.3 mg/0.3 mL SOAJ injection Inject 0.3 mg into the muscle as needed (allergic reaction). Reported on 03/28/2015   Yes [provider]  etonogestrel (NEXPLANON) 68 MG IMPL implant 1 each by Subdermal route once.   Yes [provider]  Fingolimod HCl (GILENYA) 0.5 MG CAPS Take 1 capsule (0.5 mg total) by mouth daily. Patient taking differently: Take 0.5 mg by mouth every morning.  05/05/18  Yes Cameron Sprang, MD  FLUoxetine (PROZAC) 40 MG capsule Take 2 capsules (80 mg total) by mouth every morning. 09/17/18 09/17/19 Yes Charlcie Cradle, MD  haloperidol (HALDOL) 1 MG tablet Take 1 tablet (1 mg total) by mouth 2 (two) times daily. 09/17/18  Yes Charlcie Cradle, MD  lithium carbonate (LITHOBID) 300 MG CR tablet Take 2 tablets (600 mg total) by mouth 2 (two) times daily. 09/17/18  Yes Charlcie Cradle, MD  loratadine (CLARITIN) 10 MG tablet Take 10 mg by mouth daily as needed for allergies.   Yes [provider]  methocarbamol (ROBAXIN) 750 MG tablet Take 1 tablet (750 mg total) by mouth 2 (two) times daily as needed for muscle spasms. 09/21/18 03/20/19 Yes Milinda Pointer, MD  Multiple Vitamins-Minerals (MULTIVITAMIN ADULT PO) Take 1 tablet by mouth daily.    Yes [provider]  NON  FORMULARY Take 1 each by mouth at bedtime. CBD Gummy   Yes [provider]  omeprazole (PRILOSEC) 20 MG capsule TAKE 1 CAPSULE (20 MG TOTAL) BY MOUTH 2 (TWO) TIMES DAILY BEFORE A MEAL. 06/18/18  Yes Annitta Needs, NP  pregabalin (LYRICA) 300 MG capsule Take 1 capsule (300 mg total) by mouth at bedtime. 09/21/18 03/20/19 Yes Milinda Pointer, MD  topiramate (TOPAMAX) 100 MG tablet Take 1.5 tablets twice a day Patient taking differently: Take 150 mg by mouth 2 (two) times daily. Take 1.5 tablets twice a day 05/05/18  Yes Cameron Sprang, MD    Family History Family History  Problem Relation Age of Onset  . Diabetes Mother   . Hypertension Mother   . Diabetes Father   . Hypertension Father   . Arthritis Father   . Cancer Maternal Grandmother   . Cancer Maternal Grandfather   . Alcohol abuse Neg Hx   . Anxiety disorder Neg Hx   . Bipolar disorder Neg Hx   . Drug abuse Neg Hx   . Depression Neg Hx   . Colon cancer Neg Hx     Social History Social History   Tobacco Use  . Smoking status: Former Smoker    Packs/day: 0.25    Years: 10.00    Pack years: 2.50    Types: Cigarettes    Quit date: 07/12/2015    Years since quitting: 3.2  . Smokeless tobacco: Never Used  Substance Use Topics  . Alcohol use: No    Alcohol/week: 0.0 standard drinks    Comment: Rare  . Drug use: No     Allergies   Amitriptyline, Baclofen, Duloxetine hcl, Gabapentin, Hydrocodone-acetaminophen, Magnesium salicylate, Modafinil, Monosodium glutamate, Other, Tizanidine, Alprazolam,  Rizatriptan, Vicodin [hydrocodone-acetaminophen], Nsaids, Adhesive [tape], Ketorolac tromethamine, Lamotrigine, and Tramadol   Review of Systems Review of Systems  Constitutional: Negative for appetite change.  Respiratory: Negative for shortness of breath.   Gastrointestinal: Negative for abdominal pain.  Genitourinary: Negative for flank pain.  Musculoskeletal: Positive for back pain.  Skin: Negative for rash.  Neurological: Positive for weakness and numbness.  Psychiatric/Behavioral: Negative for confusion.     Physical Exam Updated Vital Signs BP (!) 159/80 (BP Location: Right Arm)   Pulse 77   Temp 98.1 F (36.7 C) (Oral)   Resp 16   Ht 5\' 5"  (1.651 m)   Wt 117.9 kg   LMP 09/15/2018   SpO2 100%   BMI 43.27 kg/m   Physical Exam Vitals signs and nursing note reviewed.  HENT:     Head: Normocephalic.  Neck:     Musculoskeletal: Neck supple.  Cardiovascular:     Rate and Rhythm: Regular rhythm.  Pulmonary:     Breath sounds: No wheezing or rhonchi.  Abdominal:     Tenderness: There is no abdominal tenderness.  Musculoskeletal:        General: No tenderness.     Right lower leg: No edema.     Left lower leg: No edema.  Skin:    General: Skin is warm.     Capillary Refill: Capillary refill takes less than 2 seconds.  Neurological:     Mental Status: She is alert and oriented to person, place, and time.     Comments: Strength and sensation intact bilateral lower extremity.  Good flexion at ankles knees and hips.      ED Treatments / Results  Labs (all labs ordered are listed, but only abnormal results are displayed) Labs Reviewed -  No data to display  EKG None  Radiology No results found.  Procedures Procedures (including critical care time)  Medications Ordered in ED Medications  ketamine (KETALAR) injection 35 mg (35 mg Intravenous Given 09/24/18 1940)     Initial Impression / Assessment and Plan / ED Course  I have reviewed the triage vital  signs and the nursing notes.  Pertinent labs & imaging results that were available during my care of the patient were reviewed by me and considered in my medical decision making (see chart for details).        Patient states that she has increasing pain in her legs.  States her neurologist is going to have her pain doctor give her ketamine as a nasal spray.  States she is can have to go through the company however because she has Medicare. Patient feels better after treatment with IV ketamine here.  Discharge home.   Final Clinical Impressions(s) / ED Diagnoses   Final diagnoses:  Pain in both lower extremities    ED Discharge Orders    None       Davonna Belling, MD 09/24/18 2338

## 2018-09-24 NOTE — Telephone Encounter (Signed)
Patient said she is in extreme pain and she has been having to go back and fourth to the hospital. She is able to move legs but the pain is so bad. She is wanting to get admitted to the hospital from Dr. Delice Lesch. She said the EMS is tired of seeing her she said. Please call her back. Thanks!

## 2018-09-24 NOTE — Telephone Encounter (Signed)
Patient is calling again and needs to speak to someone, she states her legs are getting worst and the EMS is tires of seeing her she has been in and out of the ED   She states that she would need to be admitted to the [facility]

## 2018-09-24 NOTE — Telephone Encounter (Signed)
Went to ER Monday for the same pain at [facility]. Could not move them then.   Fainting and having seizures. For the last month these have increased. Psych Dr. Prescribed Haldol.

## 2018-09-24 NOTE — Telephone Encounter (Signed)
I will discuss with her at her next scheduled appointment

## 2018-09-24 NOTE — Discharge Instructions (Addendum)
Follow-up with your neurologist and your pain clinic.

## 2018-09-25 ENCOUNTER — Telehealth: Payer: Self-pay | Admitting: Neurology

## 2018-09-25 NOTE — Telephone Encounter (Signed)
Have spoken to pt already about this. Not sure if this is a medication that [name] can prescribe. This will need to be addressed when Dr. Delice Lesch returns on Monday.  Please leave for Dr. Delice Lesch to review.

## 2018-09-25 NOTE — Telephone Encounter (Signed)
Patient went to the ED last night and they gave her Ketamine it made all of her symptoms go away. She wants to know if [name] will  Be able to give her a RX or if we have something like that would help her please call

## 2018-09-26 DIAGNOSIS — Z886 Allergy status to analgesic agent status: Secondary | ICD-10-CM | POA: Diagnosis not present

## 2018-09-26 DIAGNOSIS — S199XXA Unspecified injury of neck, initial encounter: Secondary | ICD-10-CM | POA: Diagnosis not present

## 2018-09-26 DIAGNOSIS — R55 Syncope and collapse: Secondary | ICD-10-CM | POA: Diagnosis not present

## 2018-09-26 DIAGNOSIS — R4020 Unspecified coma: Secondary | ICD-10-CM | POA: Diagnosis not present

## 2018-09-26 DIAGNOSIS — J0100 Acute maxillary sinusitis, unspecified: Secondary | ICD-10-CM | POA: Diagnosis not present

## 2018-09-26 DIAGNOSIS — R52 Pain, unspecified: Secondary | ICD-10-CM | POA: Diagnosis not present

## 2018-09-26 DIAGNOSIS — F329 Major depressive disorder, single episode, unspecified: Secondary | ICD-10-CM | POA: Diagnosis not present

## 2018-09-26 DIAGNOSIS — I10 Essential (primary) hypertension: Secondary | ICD-10-CM | POA: Diagnosis not present

## 2018-09-26 DIAGNOSIS — Z87891 Personal history of nicotine dependence: Secondary | ICD-10-CM | POA: Diagnosis not present

## 2018-09-26 DIAGNOSIS — R42 Dizziness and giddiness: Secondary | ICD-10-CM | POA: Diagnosis not present

## 2018-09-26 DIAGNOSIS — S01511A Laceration without foreign body of lip, initial encounter: Secondary | ICD-10-CM | POA: Diagnosis not present

## 2018-09-26 DIAGNOSIS — Z888 Allergy status to other drugs, medicaments and biological substances status: Secondary | ICD-10-CM | POA: Diagnosis not present

## 2018-09-26 DIAGNOSIS — W01198A Fall on same level from slipping, tripping and stumbling with subsequent striking against other object, initial encounter: Secondary | ICD-10-CM | POA: Diagnosis not present

## 2018-09-26 DIAGNOSIS — G35 Multiple sclerosis: Secondary | ICD-10-CM | POA: Diagnosis not present

## 2018-09-26 DIAGNOSIS — H10023 Other mucopurulent conjunctivitis, bilateral: Secondary | ICD-10-CM | POA: Diagnosis not present

## 2018-09-26 DIAGNOSIS — R457 State of emotional shock and stress, unspecified: Secondary | ICD-10-CM | POA: Diagnosis not present

## 2018-09-26 DIAGNOSIS — F419 Anxiety disorder, unspecified: Secondary | ICD-10-CM | POA: Diagnosis not present

## 2018-09-26 DIAGNOSIS — Z79899 Other long term (current) drug therapy: Secondary | ICD-10-CM | POA: Diagnosis not present

## 2018-09-26 DIAGNOSIS — S0990XA Unspecified injury of head, initial encounter: Secondary | ICD-10-CM | POA: Diagnosis not present

## 2018-09-26 DIAGNOSIS — W19XXXA Unspecified fall, initial encounter: Secondary | ICD-10-CM | POA: Diagnosis not present

## 2018-09-28 ENCOUNTER — Other Ambulatory Visit: Payer: Self-pay

## 2018-09-28 ENCOUNTER — Telehealth: Payer: Self-pay | Admitting: Neurology

## 2018-09-28 ENCOUNTER — Encounter (HOSPITAL_COMMUNITY): Payer: Self-pay

## 2018-09-28 ENCOUNTER — Emergency Department (HOSPITAL_COMMUNITY)
Admission: EM | Admit: 2018-09-28 | Discharge: 2018-09-29 | Disposition: A | Discharge status: Home / Self Care | Payer: Medicare Other | Attending: Emergency Medicine | Admitting: Emergency Medicine

## 2018-09-28 DIAGNOSIS — I10 Essential (primary) hypertension: Secondary | ICD-10-CM | POA: Diagnosis not present

## 2018-09-28 DIAGNOSIS — F329 Major depressive disorder, single episode, unspecified: Secondary | ICD-10-CM | POA: Diagnosis not present

## 2018-09-28 DIAGNOSIS — F419 Anxiety disorder, unspecified: Secondary | ICD-10-CM | POA: Diagnosis not present

## 2018-09-28 DIAGNOSIS — Z87891 Personal history of nicotine dependence: Secondary | ICD-10-CM | POA: Insufficient documentation

## 2018-09-28 DIAGNOSIS — Z299 Encounter for prophylactic measures, unspecified: Secondary | ICD-10-CM | POA: Diagnosis not present

## 2018-09-28 DIAGNOSIS — R42 Dizziness and giddiness: Secondary | ICD-10-CM | POA: Diagnosis not present

## 2018-09-28 DIAGNOSIS — F919 Conduct disorder, unspecified: Secondary | ICD-10-CM | POA: Diagnosis not present

## 2018-09-28 DIAGNOSIS — Z79899 Other long term (current) drug therapy: Secondary | ICD-10-CM | POA: Insufficient documentation

## 2018-09-28 DIAGNOSIS — Z6841 Body Mass Index (BMI) 40.0 and over, adult: Secondary | ICD-10-CM | POA: Diagnosis not present

## 2018-09-28 DIAGNOSIS — H109 Unspecified conjunctivitis: Secondary | ICD-10-CM | POA: Diagnosis not present

## 2018-09-28 LAB — COMPREHENSIVE METABOLIC PANEL
ALT: 22 U/L (ref 0–44)
AST: 21 U/L (ref 15–41)
Albumin: 3.7 g/dL (ref 3.5–5.0)
Alkaline Phosphatase: 75 U/L (ref 38–126)
Anion gap: 10 (ref 5–15)
BUN: 17 mg/dL (ref 6–20)
CO2: 28 mmol/L (ref 22–32)
Calcium: 8.8 mg/dL — ABNORMAL LOW (ref 8.9–10.3)
Chloride: 102 mmol/L (ref 98–111)
Creatinine, Ser: 0.87 mg/dL (ref 0.44–1.00)
GFR calc Af Amer: >60 mL/min (ref >60)
GFR calc non Af Amer: >60 mL/min (ref >60)
Glucose, Bld: 83 mg/dL (ref 70–99)
Potassium: 2.8 mmol/L — ABNORMAL LOW (ref 3.5–5.1)
Sodium: 140 mmol/L (ref 135–145)
Total Bilirubin: 0.2 mg/dL — ABNORMAL LOW (ref 0.3–1.2)
Total Protein: 7.1 g/dL (ref 6.5–8.1)

## 2018-09-28 LAB — RAPID URINE DRUG SCREEN, HOSP PERFORMED
Amphetamines: NOT DETECTED
Barbiturates: NOT DETECTED
Benzodiazepines: POSITIVE — AB
Cocaine: NOT DETECTED
Opiates: NOT DETECTED
Tetrahydrocannabinol: POSITIVE — AB

## 2018-09-28 LAB — CBC WITH DIFFERENTIAL/PLATELET
Abs Immature Granulocytes: 0.04 10*3/uL (ref 0.00–0.07)
Basophils Absolute: 0 10*3/uL (ref 0.0–0.1)
Basophils Relative: 0 %
Eosinophils Absolute: 0 10*3/uL (ref 0.0–0.5)
Eosinophils Relative: 0 %
HCT: 42.5 % (ref 36.0–46.0)
Hemoglobin: 13.6 g/dL (ref 12.0–15.0)
Immature Granulocytes: 1 %
Lymphocytes Relative: 12 %
Lymphs Abs: 1 10*3/uL (ref 0.7–4.0)
MCH: 29.4 pg (ref 26.0–34.0)
MCHC: 32 g/dL (ref 30.0–36.0)
MCV: 91.8 fL (ref 80.0–100.0)
Monocytes Absolute: 0.9 10*3/uL (ref 0.1–1.0)
Monocytes Relative: 10 %
Neutro Abs: 6.7 10*3/uL (ref 1.7–7.7)
Neutrophils Relative %: 77 %
Platelets: 449 10*3/uL — ABNORMAL HIGH (ref 150–400)
RBC: 4.63 MIL/uL (ref 3.87–5.11)
RDW: 13.3 % (ref 11.5–15.5)
WBC: 8.6 10*3/uL (ref 4.0–10.5)
nRBC: 0 % (ref 0.0–0.2)

## 2018-09-28 LAB — POC URINE PREG, ED: Preg Test, Ur: NEGATIVE

## 2018-09-28 LAB — ETHANOL: Alcohol, Ethyl (B): <10 mg/dL (ref <10)

## 2018-09-28 MED ORDER — ACETAMINOPHEN 325 MG PO TABS
650.0000 mg | ORAL_TABLET | Freq: Once | ORAL | Status: AC
  Administered 2018-09-28: 650 mg via ORAL
  Filled 2018-09-28: qty 2

## 2018-09-28 MED ORDER — POTASSIUM CHLORIDE CRYS ER 20 MEQ PO TBCR
40.0000 meq | EXTENDED_RELEASE_TABLET | Freq: Once | ORAL | Status: AC
  Administered 2018-09-28: 40 meq via ORAL
  Filled 2018-09-28: qty 2

## 2018-09-28 NOTE — ED Provider Notes (Addendum)
[facility] EMERGENCY DEPARTMENT Provider Note   CSN: [id number] Arrival date & time: 09/28/18  1647     History   Chief Complaint Chief Complaint  Patient presents with  . Medical Clearance    HPI Emily [name] is a 38 y.o. female.     Patient is a 38 year old female with past medical history of anxiety, asthma, multiple sclerosis, chronic pain, depression who presents emergency department for psychiatric evaluation.  Patient is very tearful on my exam and states that she just wants to "get right".  She reports that she is overdue on her family and feels like sometimes her family would be better with out her.  Reports that she is unable to function on a day-to-day basis due to her recently diagnosed ADHD.  She reports that she gets pseudoseizures often and tries to "fight them off".  She denies any thoughts of wanting to hurt herself but she does think about dying.  Denies any homicidal ideation.  She reports that she has a lot of paranoia and anxiety about various things.     Past Medical History:  Diagnosis Date  . Anxiety   . Asthma as a child  . Chronic back pain   . Chronic pain   . Conversion disorder   . DDD (degenerative disc disease), cervical   . Depression   . External hemorrhoid   . Fatty liver   . GERD (gastroesophageal reflux disease)   . History of kidney stones   . HTN (hypertension) 02/27/2015  . JC virus antibody positive   . Migraines   . MS (multiple sclerosis) ([place])    06-2006  . Neuropathy   . OA (osteoarthritis)   . Ovarian cyst   . Panic attack   . Perforated bowel ([place]) 2009   colostomy bag for 3 motnhs  . 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 PTSD (post-traumatic stress disorder)   . S/P emergency C-section   . Seasonal allergies   . Urinary urgency   . Vertigo   . Wears contact lenses   . Wears glasses     Patient Active Problem List   Diagnosis Date Noted  . Open wound of abdominal wall,  anterior, complicated, sequela [id number]  . Postoperative wound infection 09/20/2018  . Acute postoperative pain 08/25/2018  . History of substance use disorder 07/19/2018  . Chronic nausea 04/13/2018  . Nausea with vomiting 03/23/2018  . Pharmacologic therapy 03/16/2018  . Disorder of skeletal system 03/16/2018  . Problems influencing health status 03/16/2018  . Abnormal MRI, cervical spine (03/06/17) 03/16/2018  . Morbid obesity with BMI of 45.0-49.9, adult ([place]) 03/16/2018  . Chronic pain of both knees 02/18/2018  . Cervical facet hypertrophy 04/29/2017  . Cervical facet syndrome (Bilateral) (R>L) 04/29/2017  . Spondylosis without myelopathy or radiculopathy, cervical region 04/29/2017  . Spondylosis without myelopathy or radiculopathy, lumbosacral region 03/04/2017  . Cervicogenic headache 02/10/2017  . Occipital headache 02/10/2017  . Trigger point with back pain (Left) 02/10/2017  . History of fainting (vasovagal) 02/10/2017  . History of vasovagal syncope 02/10/2017  . Acute left-sided low back pain without sciatica 02/10/2017  . Neurogenic pain 01/23/2017  . Chronic musculoskeletal pain 12/02/2016  . Cervical radiculitis (Bilateral) 11/12/2016  . Chronic upper back pain (Primary Area of Pain) (Bilateral) (R>L) 11/01/2016  . Chronic hand pain [facility] Area of Pain) (Bilateral) (L>R) 11/01/2016  . Chronic wrist pain [facility] Area of Pain) (Bilateral) (L>R) 11/01/2016  .  Chronic foot/toes pain (Fourth Area of Pain) (Bilateral) (R>L) 11/01/2016  . Chronic upper extremity pain (Secondary Area of Pain) (Bilateral) (L>R) 11/01/2016  . Long term prescription benzodiazepine use 11/01/2016  . 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 Cervical foraminal stenosis (Bilateral) (C6-7) 11/01/2016  . 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 DDD (degenerative disc disease), thoracic 11/01/2016  . DDD (degenerative disc disease), lumbar 11/01/2016  .  Lumbar facet arthropathy 11/01/2016  . Lumbar facet syndrome (Bilateral) (R>L) 11/01/2016  . Vitamin D deficiency 10/28/2016  . Opiate use 07/23/2016  . Chronic neck pain (Primary Area of Pain) (Bilateral) (R>L) 07/23/2016  . Chronic low back pain (Secondary Area of Pain) (Bilateral) (L>R) 07/23/2016  . Chronic knee pain (Fifth Area of Pain) (Left) 07/23/2016  . Midline thoracic back pain 07/23/2016  . Chronic lower extremity pain (Bilateral) 07/23/2016  . Incarcerated incisional hernia 02/16/2016  . Incisional hernia 02/15/2016  . Carpal tunnel syndrome [facility] Area of Pain) (Bilateral) (L>R) 11/13/2015  . Substance abuse ([place]) 09/07/2015  . Paresthesia 08/02/2015  . Chronic pain syndrome 08/02/2015  . Postpartum endometritis 07/21/2015  . PTSD (post-traumatic stress disorder) 05/16/2015  . Conversion disorder with seizures or convulsions 05/16/2015  . Severe episode of recurrent major depressive disorder, without psychotic features ([place]) 05/16/2015  . GAD (generalized anxiety disorder) 05/16/2015  . Difficult intravenous access 05/08/2015  . Right optic neuritis 04/27/2015  . Conversion disorder with attacks or seizures, persistent, with psychological stressor 03/28/2015  . Migraine with aura and without status migrainosus, not intractable 03/28/2015  . Generalized anxiety disorder 03/28/2015  . HTN (hypertension) 02/27/2015  . Perforated bowel ([place]) 02/27/2015  . Depression 12/27/2014  . Multiple sclerosis ([place]) 12/27/2014  . Migraine 12/27/2014  . Seizure disorder ([place]) 12/27/2014  . Smoker 12/27/2014  . Obesity 12/27/2014  . DDD (degenerative disc disease), cervical 07/28/2014  . Major depressive disorder, single episode 11/02/2013  . Laryngopharyngeal reflux 10/25/2010  . Tonsillitis 10/25/2010    Past Surgical History:  Procedure Laterality Date  . ABDOMINAL SURGERY    . APPENDECTOMY    . BOWEL RESECTION  01/2007   with colostomy  . CESAREAN SECTION N/A 07/12/2015    Procedure: CESAREAN SECTION;  Surgeon: Guss Bunde, MD;  Location: [place];  Service: Obstetrics;  Laterality: N/A;  . COLONOSCOPY WITH PROPOFOL N/A 01/29/2017   Procedure: COLONOSCOPY WITH PROPOFOL;  Surgeon: Ileana Roup, MD;  Location: [facility];  Service: General;  Laterality: N/A;  . COLOSTOMY CLOSURE  04/2007  . EVALUATION UNDER ANESTHESIA WITH HEMORRHOIDECTOMY N/A 06/11/2018   Procedure: ANORECTAL EXAM UNDER ANESTHESIA WITH HEMORRHOIDECTOMY;  Surgeon: Ileana Roup, MD;  Location: [place];  Service: General;  Laterality: N/A;  . EXCISIONAL HEMORRHOIDECTOMY    . EXTREMITY CYST EXCISION  1994   right leg  . HERNIA REPAIR    . INCISIONAL HERNIA REPAIR N/A 02/16/2016   Procedure: HERNIA REPAIR INCISIONAL;  Surgeon: Fanny Skates, MD;  Location: [facility];  Service: General;  Laterality: N/A;  . INSERTION OF MESH  02/16/2016   Procedure: INSERTION OF MESH;  Surgeon: Fanny Skates, MD;  Location: [facility];  Service: General;;  . RADIOLOGY WITH ANESTHESIA N/A 03/06/2017   Procedure: MRI WITH ANESTHESIA OF CERVICAL SPINE WITHOUT CONTRAST, MRI OF LUMBAR SPINE WITHOUT CONTRAST;  Surgeon: Radiologist, Medication, MD;  Location: [place];  Service: Radiology;  Laterality: N/A;  . RADIOLOGY WITH ANESTHESIA N/A 09/15/2018   Procedure: MRI OF BRAIN WITH AND WITHOUT  CONTRAST;  Surgeon: Radiologist, Medication, MD;  Location: [place];  Service: Radiology;  Laterality: N/A;  . SCAR REVISION  01/21/2011   Procedure: SCAR REVISION;  Surgeon: Hermelinda Dellen;  Location: [place];  Service: Plastics;  Laterality: N/A;  exploration of scar of abdomen and repair of defect  . TOOTH EXTRACTION Left 10/2016  . TRANSRECTAL DRAINAGE OF PELVIC ABSCESS       OB History    Gravida  1   Para  1   Term      Preterm  1   AB      Living  1     SAB      TAB      Ectopic      Multiple  0   Live Births  1            Home Medications     Prior to Admission medications   Medication Sig Start Date End Date Taking? Authorizing Provider  AIMOVIG 140 MG/ML SOAJ Inject 140 mg into the muscle every 30 (thirty) days.  08/21/18   [provider]  albuterol (PROVENTIL HFA;VENTOLIN HFA) 108 (90 Base) MCG/ACT inhaler Inhale 2 puffs into the lungs every 6 (six) hours as needed for wheezing or shortness of breath. Patient taking differently: Inhale 2 puffs into the lungs every 4 (four) hours as needed for wheezing or shortness of breath.  02/15/15   Jaclyn Prime, Collene Leyden, PA-C  amLODipine (NORVASC) 5 MG tablet Take 5 mg by mouth every morning.  01/23/16   [provider]  busPIRone (BUSPAR) 15 MG tablet Take 1 tablet (15 mg total) by mouth 3 (three) times daily. 09/17/18   Charlcie Cradle, MD  diazepam (VALIUM) 5 MG tablet Take 1 tablet (5 mg total) by mouth 3 (three) times daily as needed for anxiety. Patient taking differently: Take 5 mg by mouth every 8 (eight) hours as needed for anxiety.  09/17/18   Charlcie Cradle, MD  divalproex (DEPAKOTE) 500 MG DR tablet Take 1 tablet (500 mg total) by mouth at bedtime. 05/05/18   Cameron Sprang, MD  dronabinol (MARINOL) 2.5 MG capsule Take 1 capsule (2.5 mg total) by mouth 3 (three) times daily before meals. 07/06/18   Annitta Needs, NP  EPINEPHrine (EPIPEN) 0.3 mg/0.3 mL SOAJ injection Inject 0.3 mg into the muscle as needed (allergic reaction). Reported on 03/28/2015    [provider]  etonogestrel (NEXPLANON) 68 MG IMPL implant 1 each by Subdermal route once.    [provider]  Fingolimod HCl (GILENYA) 0.5 MG CAPS Take 1 capsule (0.5 mg total) by mouth daily. Patient taking differently: Take 0.5 mg by mouth every morning.  05/05/18   Cameron Sprang, MD  FLUoxetine (PROZAC) 40 MG capsule Take 2 capsules (80 mg total) by mouth every morning. 09/17/18 09/17/19  Charlcie Cradle, MD  haloperidol (HALDOL) 1 MG tablet Take 1 tablet (1 mg total) by mouth 2 (two) times daily.  09/17/18   Charlcie Cradle, MD  lithium carbonate (LITHOBID) 300 MG CR tablet Take 2 tablets (600 mg total) by mouth 2 (two) times daily. 09/17/18   Charlcie Cradle, MD  loratadine (CLARITIN) 10 MG tablet Take 10 mg by mouth daily as needed for allergies.    [provider]  methocarbamol (ROBAXIN) 750 MG tablet Take 1 tablet (750 mg total) by mouth 2 (two) times daily as needed for muscle spasms. 09/21/18 03/20/19  Milinda Pointer, MD  Multiple Vitamins-Minerals (MULTIVITAMIN ADULT  PO) Take 1 tablet by mouth daily.     [provider]  NON FORMULARY Take 1 each by mouth at bedtime. CBD Gummy    [provider]  omeprazole (PRILOSEC) 20 MG capsule TAKE 1 CAPSULE (20 MG TOTAL) BY MOUTH 2 (TWO) TIMES DAILY BEFORE A MEAL. 06/18/18   Annitta Needs, NP  pregabalin (LYRICA) 300 MG capsule Take 1 capsule (300 mg total) by mouth at bedtime. 09/21/18 03/20/19  Milinda Pointer, MD  topiramate (TOPAMAX) 100 MG tablet Take 1.5 tablets twice a day Patient taking differently: Take 150 mg by mouth 2 (two) times daily. Take 1.5 tablets twice a day 05/05/18   Cameron Sprang, MD    Family History Family History  Problem Relation Age of Onset  . Diabetes Mother   . Hypertension Mother   . Diabetes Father   . Hypertension Father   . Arthritis Father   . Cancer Maternal Grandmother   . Cancer Maternal Grandfather   . Alcohol abuse Neg Hx   . Anxiety disorder Neg Hx   . Bipolar disorder Neg Hx   . Drug abuse Neg Hx   . Depression Neg Hx   . Colon cancer Neg Hx     Social History Social History   Tobacco Use  . Smoking status: Former Smoker    Packs/day: 0.25    Years: 10.00    Pack years: 2.50    Types: Cigarettes    Quit date: 07/12/2015    Years since quitting: 3.2  . Smokeless tobacco: Never Used  Substance Use Topics  . Alcohol use: No    Alcohol/week: 0.0 standard drinks    Comment: Rare  . Drug use: No     Allergies   Amitriptyline, Baclofen, Duloxetine hcl,  Gabapentin, Hydrocodone-acetaminophen, Magnesium salicylate, Modafinil, Monosodium glutamate, Other, Tizanidine, Alprazolam, Rizatriptan, Vicodin [hydrocodone-acetaminophen], Nsaids, Adhesive [tape], Ketorolac tromethamine, Lamotrigine, and Tramadol   Review of Systems Review of Systems  Constitutional: Negative for chills and fever.  HENT: Negative for ear pain and sore throat.   Eyes: Negative for pain and visual disturbance.  Respiratory: Negative for cough and shortness of breath.   Cardiovascular: Negative for chest pain and palpitations.  Gastrointestinal: Negative for abdominal pain and vomiting.  Genitourinary: Negative for dysuria and hematuria.  Musculoskeletal: Negative for arthralgias and back pain.  Skin: Negative for color change and rash.  Neurological: Negative for seizures and syncope.  Psychiatric/Behavioral: Positive for behavioral problems, decreased concentration and sleep disturbance. Negative for agitation, confusion, dysphoric mood, hallucinations, self-injury and suicidal ideas. The patient is nervous/anxious and is hyperactive.   All other systems reviewed and are negative.    Physical Exam Updated Vital Signs BP (!) 162/74   Pulse 77   Temp (!) 97.1 F (36.2 C) (Oral)   Resp 16   Ht 5\' 5"  (1.651 m)   Wt 117.9 kg   LMP 09/15/2018   SpO2 99%   BMI 43.27 kg/m   Physical Exam Vitals signs and nursing note reviewed. Exam conducted with a chaperone present.  Constitutional:      Appearance: Normal appearance.  HENT:     Head: Normocephalic.  Eyes:     Conjunctiva/sclera: Conjunctivae normal.  Pulmonary:     Effort: Pulmonary effort is normal.  Skin:    General: Skin is dry.  Neurological:     Mental Status: She is alert.  Psychiatric:     Comments: Patient is very tearful and crying throughout the exam.  ED Treatments / Results  Labs (all labs ordered are listed, but only abnormal results are displayed) Labs Reviewed  COMPREHENSIVE  METABOLIC PANEL - Abnormal; Notable for the following components:      Result Value   Potassium 2.8 (*)    Calcium 8.8 (*)    Total Bilirubin 0.2 (*)    All other components within normal limits  RAPID URINE DRUG SCREEN, HOSP PERFORMED - Abnormal; Notable for the following components:   Benzodiazepines POSITIVE (*)    Tetrahydrocannabinol POSITIVE (*)    All other components within normal limits  CBC WITH DIFFERENTIAL/PLATELET - Abnormal; Notable for the following components:   Platelets 449 (*)    All other components within normal limits  ETHANOL  POC URINE PREG, ED    EKG EKG Interpretation  Date/Time:  Monday September 28 2018 20:25:07 EDT Ventricular Rate:  78 PR Interval:    QRS Duration: 99 QT Interval:  423 QTC Calculation: 482 R Axis:   80 Text Interpretation:  Sinus rhythm Low voltage, precordial leads When compared with ECG of 06/08/2018, No significant change was found Confirmed by Delora Fuel ([id number]) on 09/29/2018 1:30:58 AM   Radiology No results found.  Procedures Procedures (including critical care time)  Medications Ordered in ED Medications  potassium chloride SA (K-DUR) CR tablet 40 mEq (40 mEq Oral Given 09/28/18 2023)  acetaminophen (TYLENOL) tablet 650 mg (650 mg Oral Given 09/28/18 2251)  pregabalin (LYRICA) capsule 300 mg (300 mg Oral Given 09/29/18 0243)     Initial Impression / Assessment and Plan / ED Course  I have reviewed the triage vital signs and the nursing notes.  Pertinent labs & imaging results that were available during my care of the patient were reviewed by me and considered in my medical decision making (see chart for details).  Clinical Course as of Oct 05 2145  Mon Sep 28, 2018  1752 Patient here for medical evaluation.  History of pseudoseizures.  Per nursing staff patient began having full body shaking.  Per nursing staff, this appeared to be a pseudoseizure.  I evaluated the patient immediately after this episode and she was  not postictal.  She admits to pseudoseizures.  She is tearful.  Will obtain medical screening and then consult TTS.   [KM]  2132 Patient is stable and currently waiting on TTS.  She is voluntary.  No suicidal or homicidal ideation and therefore I do not see any need to IVC her if she decides to leave.  We will continue to wait for TTS. Care passed to Dr. Roderic Palau due to change of shift   [KM]    Clinical Course User Index [KM] Alveria Apley, PA-C         Final Clinical Impressions(s) / ED Diagnoses   Final diagnoses:  Anxiety    ED Discharge Orders    None       Kristine Royal 09/28/18 2154    Alveria Apley, PA-C 10/06/18 2147    Milton Ferguson, MD 10/07/18 1015

## 2018-09-28 NOTE — Telephone Encounter (Signed)
Pls let her know we do not prescribe these types of medications in our office. She will really have to speak to her Pain Specialist about this. Thanks

## 2018-09-28 NOTE — ED Notes (Signed)
Seizure pads applied. Pt started "seizing". Was just jerking and moaning. Notified Dr. Roderic Palau. He came to assess patient. When I lifted patient's arm up, her arm stayed in the same position.

## 2018-09-28 NOTE — BH Assessment (Signed)
Tele Assessment Note   Patient Name: Emily [name] MRN: [id number] Referring Physician: Dr. Milton Ferguson Location of Patient: [facility] Location of Provider: [place] is an 38 y.o. female reporting with increased depression and anxiety. Patient brought in by her father after he was told by Dr. Doyne Keel nurse to bring patient in to ED, as patient had called the office 10x requesting help for the past couple of days. Patient reported increased depressive symptoms and anxiety, onset 2 months ago. Patient continues to report, "I just want to get right".  Patient reported feelings of being a burden on her family and they would be better with out her. Patient reported her home life is getting worse, patient is being called names and being told she is lazy and that her parents are threatening to take her daughter away. Patient reported inability to function on a day-to-day basis due to her recently diagnosed ADHD. Patient reported having pseudoseizures often and tries to "fight them off". Patient denied thoughts of wanting to hurt herself but thinks about dying a lot. Patient reported only sleeping 2-3 hours nightly and that her daughter sleeps with her due to both of them having "night terrors". Patient is currently being seen by Dr. Doyne Keel for psychiatry. Patient reported medications are not working and that she needs new medication. Patient denied history of suicide attempts and self-harming behaviors. Patient reported no prior inpatient mental health treatment.  Patient is resides in a basement apartment with her 61 year old daughter where her parents lives in the main upper part of the home. Patient reported paying rent to her parents. Patient reports receiving disability. Patient is tearful and cooperative during assessment.  Diagnosis: Major depressive disorder  Past Medical History:  Past Medical History:  Diagnosis Date  . Anxiety   . Asthma as a child  .  Chronic back pain   . Chronic pain   . Conversion disorder   . DDD (degenerative disc disease), cervical   . Depression   . External hemorrhoid   . Fatty liver   . GERD (gastroesophageal reflux disease)   . History of kidney stones   . HTN (hypertension) 02/27/2015  . JC virus antibody positive   . Migraines   . MS (multiple sclerosis) ([place])    06-2006  . Neuropathy   . OA (osteoarthritis)   . Ovarian cyst   . Panic attack   . Perforated bowel ([place]) 2009   colostomy bag for 3 motnhs  . 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 PTSD (post-traumatic stress disorder)   . S/P emergency C-section   . Seasonal allergies   . Urinary urgency   . Vertigo   . Wears contact lenses   . Wears glasses     Past Surgical History:  Procedure Laterality Date  . ABDOMINAL SURGERY    . APPENDECTOMY    . BOWEL RESECTION  01/2007   with colostomy  . CESAREAN SECTION N/A 07/12/2015   Procedure: CESAREAN SECTION;  Surgeon: Guss Bunde, MD;  Location: [place];  Service: Obstetrics;  Laterality: N/A;  . COLONOSCOPY WITH PROPOFOL N/A 01/29/2017   Procedure: COLONOSCOPY WITH PROPOFOL;  Surgeon: Ileana Roup, MD;  Location[facility];  Service: General;  Laterality: N/A;  . COLOSTOMY CLOSURE  04/2007  . EVALUATION UNDER ANESTHESIA WITH HEMORRHOIDECTOMY N/A 06/11/2018   Procedure: ANORECTAL EXAM UNDER ANESTHESIA WITH HEMORRHOIDECTOMY;  Surgeon: Ileana Roup,  MD;  Location: [place];  Service: General;  Laterality: N/A;  . EXCISIONAL HEMORRHOIDECTOMY    . EXTREMITY CYST EXCISION  1994   right leg  . HERNIA REPAIR    . INCISIONAL HERNIA REPAIR N/A 02/16/2016   Procedure: HERNIA REPAIR INCISIONAL;  Surgeon: Fanny Skates, MD;  [facility];  Service: General;  Laterality: N/A;  . INSERTION OF MESH  02/16/2016   Procedure: INSERTION OF MESH;  Surgeon: Fanny Skates, MD;  [facility];  Service: General;;  . RADIOLOGY WITH  ANESTHESIA N/A 03/06/2017   Procedure: MRI WITH ANESTHESIA OF CERVICAL SPINE WITHOUT CONTRAST, MRI OF LUMBAR SPINE WITHOUT CONTRAST;  Surgeon: Radiologist, Medication, MD;  Location: [place];  Service: Radiology;  Laterality: N/A;  . RADIOLOGY WITH ANESTHESIA N/A 09/15/2018   Procedure: MRI OF BRAIN WITH AND WITHOUT CONTRAST;  Surgeon: Radiologist, Medication, MD;  Location: [place];  Service: Radiology;  Laterality: N/A;  . SCAR REVISION  01/21/2011   Procedure: SCAR REVISION;  Surgeon: Hermelinda Dellen;  Location: [facility];  Service: Plastics;  Laterality: N/A;  exploration of scar of abdomen and repair of defect  . TOOTH EXTRACTION Left 10/2016  . TRANSRECTAL DRAINAGE OF PELVIC ABSCESS      Family History:  Family History  Problem Relation Age of Onset  . Diabetes Mother   . Hypertension Mother   . Diabetes Father   . Hypertension Father   . Arthritis Father   . Cancer Maternal Grandmother   . Cancer Maternal Grandfather   . Alcohol abuse Neg Hx   . Anxiety disorder Neg Hx   . Bipolar disorder Neg Hx   . Drug abuse Neg Hx   . Depression Neg Hx   . Colon cancer Neg Hx     Social History:  reports that she quit smoking about 3 years ago. Her smoking use included cigarettes. She has a 2.50 pack-year smoking history. She has never used smokeless tobacco. She reports that she does not drink alcohol or use drugs.  Additional Social History:  Alcohol / Drug Use Pain Medications: see MAR Prescriptions: see MAR Over the Counter: see MAR  CIWA: CIWA-Ar BP: (!) 175/119 Pulse Rate: 74 COWS:    Allergies:  Allergies  Allergen Reactions  . Amitriptyline Hypertension and Other (See Comments)    hypertension  . Baclofen Hives and Shortness Of Breath  . Duloxetine Hcl Shortness Of Breath and Rash  . Gabapentin Shortness Of Breath and Rash  . Hydrocodone-Acetaminophen Hives and Nausea And Vomiting    Projectile vomiting  . Magnesium Salicylate Hives and Itching  .  Modafinil Other (See Comments)    Seizures   . Monosodium Glutamate Anaphylaxis  . Other Shortness Of Breath, Rash and Other (See Comments)    MSG, beans (vomiting) MSG causes hives, itching, throat swelling  . Tizanidine Hives    Other reaction(s): GI Upset (intolerance)  . Alprazolam Other (See Comments)    Lethargy  . Rizatriptan Nausea And Vomiting and Other (See Comments)    GI upset, Projectile vomiting GI upset, Projectile vomiting  . Vicodin [Hydrocodone-Acetaminophen] Hives, Nausea And Vomiting and Other (See Comments)    Projectile vomiting  . Nsaids Other (See Comments)    Irritate stomach   . Adhesive [Tape] Other (See Comments)    Skin irritation Paper tape is ok  . Ketorolac Tromethamine Nausea Only    Toradol   . Lamotrigine Rash  . Tramadol Nausea And Vomiting    Other  reaction(s): GI Upset (intolerance)    Home Medications: (Not in a hospital admission)   OB/GYN Status:  Patient's last menstrual period was 09/15/2018.  General Assessment Data Location of Assessment: [facility] TTS Assessment: In system Is this a Tele or Face-to-Face Assessment?: Tele Assessment Is this an Initial Assessment or a Re-assessment for this encounter?: Initial Assessment Patient Accompanied by:: N/A Language Other than English: No Living Arrangements: (family home) What gender do you identify as?: Female Marital status: Single Pregnancy Status: Unknown Living Arrangements: Children, Parent Can pt return to current living arrangement?: Yes Admission Status: Voluntary Is patient capable of signing voluntary admission?: Yes Referral Source: Self/Family/Friend     Crisis Care Plan Living Arrangements: Children, Parent Legal Guardian: (self) Name of Psychiatrist: (Dr. Sandre Kitty) Name of Therapist: (none reported)  Education Status Is patient currently in school?: No Is the patient employed, unemployed or receiving disability?: Receiving disability income  Risk to self  with the past 6 months Suicidal Ideation: No Has patient been a risk to self within the past 6 months prior to admission? : No Suicidal Intent: No Has patient had any suicidal intent within the past 6 months prior to admission? : No Is patient at risk for suicide?: No Suicidal Plan?: No Has patient had any suicidal plan within the past 6 months prior to admission? : No Access to Means: No What has been your use of drugs/alcohol within the last 12 months?: (none reported) Previous Attempts/Gestures: No How many times?: (0) Other Self Harm Risks: (none reported) Triggers for Past Attempts: (none reported) Intentional Self Injurious Behavior: None Family Suicide History: No Recent stressful life event(s): (family discord dx of ADHD) Persecutory voices/beliefs?: No Depression: Yes Depression Symptoms: Insomnia, Tearfulness, Isolating, Fatigue, Loss of interest in usual pleasures, Guilt, Feeling worthless/self pity Substance abuse history and/or treatment for substance abuse?: No Suicide prevention information given to non-admitted patients: Not applicable  Risk to Others within the past 6 months Homicidal Ideation: No Does patient have any lifetime risk of violence toward others beyond the six months prior to admission? : No Thoughts of Harm to Others: No Current Homicidal Intent: No Current Homicidal Plan: No Access to Homicidal Means: No Identified Victim: (n/a) History of harm to others?: No Assessment of Violence: None Noted Violent Behavior Description: (none reported) Does patient have access to weapons?: No Criminal Charges Pending?: No Does patient have a court date: No Is patient on probation?: No  Psychosis Hallucinations: None noted Delusions: None noted  Mental Status Report Appearance/Hygiene: Unremarkable Eye Contact: Good Motor Activity: Freedom of movement Speech: Logical/coherent Level of Consciousness: Alert Mood: Depressed Affect: Depressed Anxiety  Level: Moderate Thought Processes: Coherent, Relevant Judgement: Partial Orientation: Person, Place, Time, Situation Obsessive Compulsive Thoughts/Behaviors: None  Cognitive Functioning Concentration: Good Memory: Recent Intact Is patient IDD: No Insight: Fair Impulse Control: Poor Appetite: Good Have you had any weight changes? : No Change Sleep: Decreased Total Hours of Sleep: (2) Vegetative Symptoms: Decreased grooming, Staying in bed  ADLScreening [facility] Assessment Services) Patient's cognitive ability adequate to safely complete daily activities?: Yes Patient able to express need for assistance with ADLs?: Yes Independently performs ADLs?: Yes (appropriate for developmental age)  Prior Inpatient Therapy Prior Inpatient Therapy: No  Prior Outpatient Therapy Prior Outpatient Therapy: No Does patient have an ACCT team?: No Does patient have Intensive In-House Services?  : No Does patient have [facility] services? : No Does patient have P4CC services?: No  ADL Screening (condition at time of admission) Patient's cognitive ability  adequate to safely complete daily activities?: Yes Patient able to express need for assistance with ADLs?: Yes Independently performs ADLs?: Yes (appropriate for developmental age)  Advance Directives (For Healthcare) Does Patient Have a Medical Advance Directive?: Yes Type of Advance Directive: Healthcare Power of Attorney    Disposition:  Disposition Initial Assessment Completed for this Encounter: Yes  Lindon Romp, NP, recommends overnight observation for safety and stabilization with psych consult in the a.m.  This service was provided via telemedicine using a 2-way, interactive audio and video technology.  Names of all persons participating in this telemedicine service and their role in this encounter. Name: Valeen Casados Role: Patient  Name: Kirtland Bouchard Role: TTS Clinician  Name:  Role:   Name:  Role:     Venora Maples 09/28/2018 10:08 PM

## 2018-09-28 NOTE — Telephone Encounter (Signed)
Patient is wanting to know If she can get her sciatic nerves checked. She didn't know if this could be added to the MRI order for her. Shooting pain down her leg. Please call her back. Thanks!

## 2018-09-28 NOTE — ED Triage Notes (Signed)
Pt wants to be evaluated for her mental state. Just got diagnosed with ADHD and wants to "get right" for her daughter. Will not open her eyes in triage. States she has no support system and has nobody to talk to.

## 2018-09-28 NOTE — Telephone Encounter (Signed)
I called the patient, she would really like to start something now, she said her home life is getting worse, she is being caused names and being told she is lazy. They are threatening to take her daughter away. She would like to know if you can start her on something sooner.

## 2018-09-29 ENCOUNTER — Telehealth: Payer: Self-pay | Admitting: Pain Medicine

## 2018-09-29 ENCOUNTER — Telehealth: Payer: Self-pay | Admitting: Neurology

## 2018-09-29 DIAGNOSIS — F419 Anxiety disorder, unspecified: Secondary | ICD-10-CM | POA: Diagnosis not present

## 2018-09-29 DIAGNOSIS — R262 Difficulty in walking, not elsewhere classified: Secondary | ICD-10-CM | POA: Diagnosis not present

## 2018-09-29 DIAGNOSIS — M5441 Lumbago with sciatica, right side: Secondary | ICD-10-CM | POA: Diagnosis not present

## 2018-09-29 DIAGNOSIS — R102 Pelvic and perineal pain: Secondary | ICD-10-CM | POA: Diagnosis not present

## 2018-09-29 DIAGNOSIS — R39198 Other difficulties with micturition: Secondary | ICD-10-CM | POA: Diagnosis not present

## 2018-09-29 DIAGNOSIS — M545 Low back pain: Secondary | ICD-10-CM | POA: Diagnosis not present

## 2018-09-29 DIAGNOSIS — Z87891 Personal history of nicotine dependence: Secondary | ICD-10-CM | POA: Diagnosis not present

## 2018-09-29 DIAGNOSIS — G35 Multiple sclerosis: Secondary | ICD-10-CM | POA: Diagnosis not present

## 2018-09-29 DIAGNOSIS — Z888 Allergy status to other drugs, medicaments and biological substances status: Secondary | ICD-10-CM | POA: Diagnosis not present

## 2018-09-29 MED ORDER — PREGABALIN 75 MG PO CAPS
300.0000 mg | ORAL_CAPSULE | Freq: Once | ORAL | Status: AC
  Administered 2018-09-29: 300 mg via ORAL
  Filled 2018-09-29: qty 4
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
## 2018-09-29 NOTE — ED Notes (Signed)
Pt stated she wanted to leave since we weren't giving her anything else for pain, even though she has been asleep. This RN spoke to the EDP who stated since she was voluntary and not SI/HI. This RN went to speak to pt to inform her that she could leave AMA, pt stated "I don't want to leave, I just want to talk to the doctor." This RN informed the EDP she wanted to speak to her. EDP at bedside.
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
## 2018-09-29 NOTE — Telephone Encounter (Signed)
Patient is currently being seen at [facility] ED and [facility] ED.

## 2018-09-29 NOTE — Telephone Encounter (Signed)
Voicemail box full unable to leave message will try again later.

## 2018-09-29 NOTE — Telephone Encounter (Signed)
Patient lvmail asking what can be done for her sciatic pain until her open wound closes up. She went to ER for burning and stabbing pain in legs.

## 2018-09-29 NOTE — ED Provider Notes (Signed)
Was called to the patient's bedside.  She is requesting pain medication.  She was offered Tylenol but states "this does not help."  She has a history of chronic pain.  She normally takes Lyrica and is on Robaxin from her pain management specialist.  She has not taken her Lyrica tonight.  I ordered 300 mg of Lyrica.  She is here for psych evaluation.  She is not homicidal or suicidal.   Merryl Hacker, MD 09/29/18 [phone]

## 2018-09-29 NOTE — Telephone Encounter (Signed)
Dr. Delice Lesch - I see she has MRI brain scheduled 10/6 - would this have any relation to getting info regarding sciatic nerves for me to let patient know? She called stating pain shooting down her leg and wanted to know if this could be "added to" or checked out with upcoming MRI.  Called patient to ask more information and was unable to reach her.

## 2018-09-29 NOTE — Telephone Encounter (Signed)
Dr. Delice Lesch - called patient and got her voicemail (full) unable to leave messages - just FYI below about patient in the hospital today.

## 2018-09-29 NOTE — ED Notes (Signed)
Patient states she is ready to leave and wants to go home. Patient will follow up with pcp after discharge.

## 2018-09-29 NOTE — ED Notes (Signed)
Sitter observed patient getting out of her bed and  sitting herself facedown on the floor of her room.  Patient began crying.  Staff attempt to assist patient up off of the floor. Patient refused to move at first. Patient has no explanation as to why she decided to lie face down on the floor.  Staff had to move patient onto bed after asking patient to move up off of the floor.  Patient only wants to cry.  Vital signs within normal limits. No noted injuries or bruising.

## 2018-09-29 NOTE — ED Notes (Signed)
Patient refuse to sign discharge. Patient in waiting room making phone call for pick up.

## 2018-09-30 ENCOUNTER — Telehealth: Payer: Self-pay | Admitting: Neurology

## 2018-09-30 NOTE — Telephone Encounter (Signed)
Tried to call pt but unable to leave VM

## 2018-09-30 NOTE — Telephone Encounter (Signed)
Patient called in and I informed her I did pass along to Dr. Delice Lesch about her sciatic pain.   Dr. Delice Lesch - I did tell her that I didn't think the brain MRI would assess her sciatic pain  but if you wanted to add anything I would call patient back. She verbalized understanding and stated that made sense she just thought she would ask.
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
## 2018-09-30 NOTE — Telephone Encounter (Signed)
Patient called in this am. Informed her Dr. Delice Lesch doesn't prescribe Ketamine. Emily [name] said she will check with her pain doctors at her upcoming appt.

## 2018-09-30 NOTE — Telephone Encounter (Signed)
Patient called tearful about yesterday's hospital visit to [facility]. She said she had been having a hard time with things including her ADHD and her psyciatrist suggested she go to [facility].   Notes in chart from [place] who talked to patient regarding her increased depression/anxiety.   Patient just wanted Dr. Delice Lesch to know about her ED visit yesterday. No other questions/needs from patient at this time.

## 2018-10-01 ENCOUNTER — Ambulatory Visit (HOSPITAL_COMMUNITY)
Admission: RE | Admit: 2018-10-01 | Discharge: 2018-10-01 | Disposition: A | Discharge status: Home / Self Care | Payer: Medicare Other | Source: Home / Self Care | Attending: Psychiatry | Admitting: Psychiatry

## 2018-10-01 ENCOUNTER — Other Ambulatory Visit: Payer: Self-pay

## 2018-10-01 ENCOUNTER — Telehealth (HOSPITAL_COMMUNITY): Payer: Self-pay | Admitting: Psychiatry

## 2018-10-01 ENCOUNTER — Encounter (HOSPITAL_COMMUNITY): Payer: Self-pay | Admitting: Emergency Medicine

## 2018-10-01 ENCOUNTER — Telehealth (HOSPITAL_COMMUNITY): Payer: Self-pay

## 2018-10-01 ENCOUNTER — Emergency Department (HOSPITAL_COMMUNITY)
Admission: EM | Admit: 2018-10-01 | Discharge: 2018-10-02 | Discharge status: Against Medical Advice | Payer: Medicare Other | Attending: Emergency Medicine | Admitting: Emergency Medicine

## 2018-10-01 DIAGNOSIS — Z888 Allergy status to other drugs, medicaments and biological substances status: Secondary | ICD-10-CM | POA: Diagnosis not present

## 2018-10-01 DIAGNOSIS — Z87891 Personal history of nicotine dependence: Secondary | ICD-10-CM | POA: Insufficient documentation

## 2018-10-01 DIAGNOSIS — Z886 Allergy status to analgesic agent status: Secondary | ICD-10-CM | POA: Diagnosis not present

## 2018-10-01 DIAGNOSIS — I10 Essential (primary) hypertension: Secondary | ICD-10-CM | POA: Diagnosis not present

## 2018-10-01 DIAGNOSIS — Z5320 Procedure and treatment not carried out because of patient's decision for unspecified reasons: Secondary | ICD-10-CM | POA: Diagnosis not present

## 2018-10-01 DIAGNOSIS — F332 Major depressive disorder, recurrent severe without psychotic features: Secondary | ICD-10-CM | POA: Insufficient documentation

## 2018-10-01 DIAGNOSIS — F4310 Post-traumatic stress disorder, unspecified: Secondary | ICD-10-CM | POA: Insufficient documentation

## 2018-10-01 DIAGNOSIS — G35 Multiple sclerosis: Secondary | ICD-10-CM | POA: Insufficient documentation

## 2018-10-01 DIAGNOSIS — F410 Panic disorder [episodic paroxysmal anxiety] without agoraphobia: Secondary | ICD-10-CM | POA: Insufficient documentation

## 2018-10-01 DIAGNOSIS — Z885 Allergy status to narcotic agent status: Secondary | ICD-10-CM | POA: Diagnosis not present

## 2018-10-01 DIAGNOSIS — Z79899 Other long term (current) drug therapy: Secondary | ICD-10-CM | POA: Diagnosis not present

## 2018-10-01 DIAGNOSIS — E876 Hypokalemia: Secondary | ICD-10-CM

## 2018-10-01 DIAGNOSIS — F29 Unspecified psychosis not due to a substance or known physiological condition: Secondary | ICD-10-CM | POA: Diagnosis not present

## 2018-10-01 DIAGNOSIS — Z20828 Contact with and (suspected) exposure to other viral communicable diseases: Secondary | ICD-10-CM | POA: Insufficient documentation

## 2018-10-01 DIAGNOSIS — R419 Unspecified symptoms and signs involving cognitive functions and awareness: Secondary | ICD-10-CM | POA: Diagnosis present

## 2018-10-01 DIAGNOSIS — R457 State of emotional shock and stress, unspecified: Secondary | ICD-10-CM | POA: Diagnosis not present

## 2018-10-01 DIAGNOSIS — R569 Unspecified convulsions: Secondary | ICD-10-CM | POA: Diagnosis not present

## 2018-10-01 DIAGNOSIS — M549 Dorsalgia, unspecified: Secondary | ICD-10-CM | POA: Insufficient documentation

## 2018-10-01 DIAGNOSIS — F445 Conversion disorder with seizures or convulsions: Secondary | ICD-10-CM

## 2018-10-01 DIAGNOSIS — G8929 Other chronic pain: Secondary | ICD-10-CM | POA: Diagnosis not present

## 2018-10-01 LAB — CBC WITH DIFFERENTIAL/PLATELET
Abs Immature Granulocytes: 0.03 10*3/uL (ref 0.00–0.07)
Basophils Absolute: 0 10*3/uL (ref 0.0–0.1)
Basophils Relative: 0 %
Eosinophils Absolute: 0.1 10*3/uL (ref 0.0–0.5)
Eosinophils Relative: 1 %
HCT: 40.1 % (ref 36.0–46.0)
Hemoglobin: 13.1 g/dL (ref 12.0–15.0)
Immature Granulocytes: 1 %
Lymphocytes Relative: 16 %
Lymphs Abs: 1 10*3/uL (ref 0.7–4.0)
MCH: 29.7 pg (ref 26.0–34.0)
MCHC: 32.7 g/dL (ref 30.0–36.0)
MCV: 90.9 fL (ref 80.0–100.0)
Monocytes Absolute: 0.5 10*3/uL (ref 0.1–1.0)
Monocytes Relative: 7 %
Neutro Abs: 4.7 10*3/uL (ref 1.7–7.7)
Neutrophils Relative %: 75 %
Platelets: 343 10*3/uL (ref 150–400)
RBC: 4.41 MIL/uL (ref 3.87–5.11)
RDW: 13.2 % (ref 11.5–15.5)
WBC: 6.3 10*3/uL (ref 4.0–10.5)
nRBC: 0 % (ref 0.0–0.2)

## 2018-10-01 LAB — COMPREHENSIVE METABOLIC PANEL
ALT: 35 U/L (ref 0–44)
AST: 22 U/L (ref 15–41)
Albumin: 3.6 g/dL (ref 3.5–5.0)
Alkaline Phosphatase: 74 U/L (ref 38–126)
Anion gap: 9 (ref 5–15)
BUN: 15 mg/dL (ref 6–20)
CO2: 27 mmol/L (ref 22–32)
Calcium: 8.5 mg/dL — ABNORMAL LOW (ref 8.9–10.3)
Chloride: 102 mmol/L (ref 98–111)
Creatinine, Ser: 0.74 mg/dL (ref 0.44–1.00)
GFR calc Af Amer: >60 mL/min (ref >60)
GFR calc non Af Amer: >60 mL/min (ref >60)
Glucose, Bld: 103 mg/dL — ABNORMAL HIGH (ref 70–99)
Potassium: 2.7 mmol/L — CL (ref 3.5–5.1)
Sodium: 138 mmol/L (ref 135–145)
Total Bilirubin: 0.4 mg/dL (ref 0.3–1.2)
Total Protein: 6.6 g/dL (ref 6.5–8.1)

## 2018-10-01 LAB — RAPID URINE DRUG SCREEN, HOSP PERFORMED
Amphetamines: NOT DETECTED
Barbiturates: NOT DETECTED
Benzodiazepines: POSITIVE — AB
Cocaine: NOT DETECTED
Opiates: POSITIVE — AB
Tetrahydrocannabinol: POSITIVE — AB

## 2018-10-01 LAB — ETHANOL: Alcohol, Ethyl (B): <10 mg/dL (ref <10)

## 2018-10-01 LAB — I-STAT BETA HCG BLOOD, ED (MC, WL, AP ONLY): I-stat hCG, quantitative: <5 m[IU]/mL (ref <5)

## 2018-10-01 MED ORDER — POTASSIUM CHLORIDE 10 MEQ/100ML IV SOLN
10.0000 meq | INTRAVENOUS | Status: AC
  Administered 2018-10-02 (×2): 10 meq via INTRAVENOUS
  Filled 2018-10-01 (×2): qty 100

## 2018-10-01 MED ORDER — METHOCARBAMOL 500 MG PO TABS
750.0000 mg | ORAL_TABLET | Freq: Once | ORAL | Status: AC
  Administered 2018-10-01: 22:00:00 750 mg via ORAL
  Filled 2018-10-01: qty 2

## 2018-10-01 MED ORDER — PREGABALIN 50 MG PO CAPS
300.0000 mg | ORAL_CAPSULE | Freq: Once | ORAL | Status: AC
  Administered 2018-10-01: 300 mg via ORAL
  Filled 2018-10-01: qty 6

## 2018-10-01 MED ORDER — DIAZEPAM 5 MG PO TABS
5.0000 mg | ORAL_TABLET | Freq: Once | ORAL | Status: AC
  Administered 2018-10-01: 5 mg via ORAL
  Filled 2018-10-01: qty 1

## 2018-10-01 MED ORDER — POTASSIUM CHLORIDE CRYS ER 20 MEQ PO TBCR
40.0000 meq | EXTENDED_RELEASE_TABLET | Freq: Once | ORAL | Status: AC
  Administered 2018-10-02: 02:00:00 40 meq via ORAL
  Filled 2018-10-01: qty 2

## 2018-10-01 NOTE — Telephone Encounter (Signed)
Patient returned call and declined to give any information until speaking with a nurse.

## 2018-10-01 NOTE — ED Triage Notes (Addendum)
Per EMS, patient from [facility] c/o anxiety and psuedoseizures. States lowered herself to floor while "shaking" in the lobby at [facility]. Hx anxiety, MS, PTSD.
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
## 2018-10-01 NOTE — Telephone Encounter (Signed)
Left pt detailed message. Informed that if she needs a new prescription that we could send one it but insurance would not cover cover it and that she would have to pay out of pocked. Instructed pt to call back and let us know what she wanted to do.

## 2018-10-01 NOTE — ED Notes (Signed)
Date and time results received: 10/01/18 11:00 PM (use smartphrase ".now" to insert current time)  Test: Potassium Critical Value: 2.7  Name of Provider Notified: Merleen Nicely, [place]  Orders Received? Or Actions Taken?:none

## 2018-10-01 NOTE — ED Notes (Signed)
Patient left unit with all belongings (back pack purse), and one toy.  Patient observed via telemonitor at [employer] transfer self from wheelchair to car in passenger side.
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
## 2018-10-01 NOTE — ED Notes (Signed)
Patient tearful and hyperventilating. Redirected with deep breathing.
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
## 2018-10-01 NOTE — Telephone Encounter (Signed)
I spoke with pt. She is on her way to the [facility] behavioral health crisis center. I agree that she appears to be decompensating and meds are not helping.

## 2018-10-02 ENCOUNTER — Telehealth (INDEPENDENT_AMBULATORY_CARE_PROVIDER_SITE_OTHER): Payer: Self-pay | Admitting: Neurology

## 2018-10-02 ENCOUNTER — Encounter: Payer: Self-pay | Admitting: Neurology

## 2018-10-02 ENCOUNTER — Other Ambulatory Visit: Payer: Self-pay

## 2018-10-02 DIAGNOSIS — F410 Panic disorder [episodic paroxysmal anxiety] without agoraphobia: Secondary | ICD-10-CM | POA: Diagnosis not present

## 2018-10-02 LAB — SARS CORONAVIRUS 2 BY RT PCR (HOSPITAL ORDER, PERFORMED IN ~~LOC~~ HOSPITAL LAB): SARS Coronavirus 2: NEGATIVE

## 2018-10-02 MED ORDER — ACETAMINOPHEN 500 MG PO TABS: 1000.0000 mg | ORAL_TABLET | Freq: Once | ORAL | Status: DC

## 2018-10-02 NOTE — ED Notes (Addendum)
PT began yelling at [employer] and tech due to not receiving pain. IV access was removed and Lattie Haw PA was notified that pt is continuing to want pain medications. Pt had been informed multiple times that narcotic medications would not be given and Tylenol was ordered for pain however pt refused Tylenol.  Pt was able to ambulate out of department without any difficulty.

## 2018-10-02 NOTE — ED Provider Notes (Signed)
   6:46 AM Patient was assessed by TTS last evening and recommended for overnight observation and reassessment in the morning.  Throughout the evening she is continually requested pain medication, notably morphine and Dilaudid by name.  She was offered Tylenol which she refused.  Patient has a care plan due to her issues with chronic pain and I do not feel narcotics are indicated.  Patient was informed of this several times, became verbally abusive to triage nurse and walked out.  She did not have any SI/HI/AVH and was not under IVC, she was being seen for anxiety and pseudoseizures.  I do not feel she needs to be placed under IVC at this time.  Patient walked out of the ER without difficulty.   Larene Pickett, PA-C 10/02/18 [place], April, MD 10/02/18 [phone]

## 2018-10-02 NOTE — Progress Notes (Signed)
Several attempts to connect via video were sent with no reply. She initially answered phone, then after several attempts for video, when patient was called again, there was no answer, voicemail left. Patient instructed to r/s for in-person visit. On review of ER notes, she has been instructed by her psychiatrist to go to ER for inpatient psychiatric admission due to decompensation.
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
## 2018-10-02 NOTE — ED Notes (Signed)
Pt still requesting pain medications other than Tylenol. Emily [name], [place] notified of pt request. Pt then ambulated to restroom without any difficulty.

## 2018-10-02 NOTE — ED Notes (Signed)
Pt has requested Fentanyl or Morphine for her MS pain

## 2018-10-02 NOTE — ED Notes (Signed)
Pt is requesting narcoticsand has refused all other options for pain management. Pt has become irate yelling at staff and ambulating around the room. Pt does not appear to be in any distress. Pt states her legs are not working. Pt also states we are under investigation for being ridiculous

## 2018-10-07 ENCOUNTER — Ambulatory Visit: Payer: Medicare Other | Admitting: Neurology

## 2018-10-08 ENCOUNTER — Ambulatory Visit (INDEPENDENT_AMBULATORY_CARE_PROVIDER_SITE_OTHER): Payer: Medicare Other | Admitting: Psychiatry

## 2018-10-08 ENCOUNTER — Other Ambulatory Visit: Payer: Self-pay

## 2018-10-08 ENCOUNTER — Ambulatory Visit (HOSPITAL_COMMUNITY): Admit: 2018-10-08 | Payer: Medicare Other

## 2018-10-08 ENCOUNTER — Encounter (HOSPITAL_COMMUNITY): Payer: Self-pay | Admitting: Psychiatry

## 2018-10-08 DIAGNOSIS — I639 Cerebral infarction, unspecified: Secondary | ICD-10-CM | POA: Diagnosis not present

## 2018-10-08 DIAGNOSIS — F900 Attention-deficit hyperactivity disorder, predominantly inattentive type: Secondary | ICD-10-CM | POA: Diagnosis not present

## 2018-10-08 DIAGNOSIS — F411 Generalized anxiety disorder: Secondary | ICD-10-CM | POA: Diagnosis not present

## 2018-10-08 DIAGNOSIS — F445 Conversion disorder with seizures or convulsions: Secondary | ICD-10-CM | POA: Diagnosis not present

## 2018-10-08 DIAGNOSIS — F332 Major depressive disorder, recurrent severe without psychotic features: Secondary | ICD-10-CM

## 2018-10-08 DIAGNOSIS — G43611 Persistent migraine aura with cerebral infarction, intractable, with status migrainosus: Secondary | ICD-10-CM | POA: Diagnosis not present

## 2018-10-08 MED ORDER — ATOMOXETINE HCL 40 MG PO CAPS: 40.0000 mg | ORAL_CAPSULE | Freq: Every day | ORAL | 1 refills | Status: DC

## 2018-10-08 MED ORDER — DIAZEPAM 5 MG PO TABS: 5.0000 mg | ORAL_TABLET | Freq: Two times a day (BID) | ORAL | 1 refills | Status: DC | PRN
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
## 2018-10-09 ENCOUNTER — Other Ambulatory Visit (HOSPITAL_COMMUNITY)
Admission: RE | Admit: 2018-10-09 | Discharge: 2018-10-09 | Disposition: A | Discharge status: Home / Self Care | Payer: Medicare Other | Source: Ambulatory Visit | Attending: Neurology | Admitting: Neurology

## 2018-10-09 DIAGNOSIS — Z20828 Contact with and (suspected) exposure to other viral communicable diseases: Secondary | ICD-10-CM | POA: Insufficient documentation

## 2018-10-09 DIAGNOSIS — Z01812 Encounter for preprocedural laboratory examination: Secondary | ICD-10-CM | POA: Insufficient documentation

## 2018-10-11 LAB — NOVEL CORONAVIRUS, NAA (HOSP ORDER, SEND-OUT TO REF LAB; TAT 18-24 HRS): SARS-CoV-2, NAA: NOT DETECTED

## 2018-10-12 ENCOUNTER — Encounter (HOSPITAL_COMMUNITY): Payer: Self-pay | Admitting: *Deleted

## 2018-10-12 ENCOUNTER — Telehealth (INDEPENDENT_AMBULATORY_CARE_PROVIDER_SITE_OTHER): Payer: Medicare Other | Admitting: Neurology

## 2018-10-12 ENCOUNTER — Telehealth: Payer: Self-pay | Admitting: Neurology

## 2018-10-12 ENCOUNTER — Other Ambulatory Visit (HOSPITAL_COMMUNITY): Payer: Self-pay | Admitting: Psychiatry

## 2018-10-12 ENCOUNTER — Encounter: Payer: Self-pay | Admitting: Neurology

## 2018-10-12 ENCOUNTER — Other Ambulatory Visit: Payer: Self-pay

## 2018-10-12 VITALS — HR 76 | Ht 64.0 in | Wt 265.0 lb

## 2018-10-12 DIAGNOSIS — G894 Chronic pain syndrome: Secondary | ICD-10-CM

## 2018-10-12 DIAGNOSIS — G35 Multiple sclerosis: Secondary | ICD-10-CM

## 2018-10-12 DIAGNOSIS — F445 Conversion disorder with seizures or convulsions: Secondary | ICD-10-CM

## 2018-10-12 DIAGNOSIS — F332 Major depressive disorder, recurrent severe without psychotic features: Secondary | ICD-10-CM

## 2018-10-12 DIAGNOSIS — G43709 Chronic migraine without aura, not intractable, without status migrainosus: Secondary | ICD-10-CM

## 2018-10-12 NOTE — Progress Notes (Signed)
Denies chest pain, shob, or cardiology visit. States she is coming by DSS transportation.
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
## 2018-10-12 NOTE — Telephone Encounter (Signed)
1. Patient called and said she has the worst migraine of her life today. She'd like to know what she can do for relief.  2. She'd also like to know if Dr. Delice Lesch could refer her neuropsychiatrist instead of a regular psychiatrist. She said she thinks that may be a better fit with her conditions.  [facility]

## 2018-10-12 NOTE — Telephone Encounter (Signed)
Patient needs to know if she needs to go to the ED or if we can call her in something for the Migraine she states she is in really bad pain  Please call

## 2018-10-12 NOTE — Telephone Encounter (Signed)
Pt has virtual visit scheduled today at 1:15 per Delice Lesch

## 2018-10-13 ENCOUNTER — Ambulatory Visit (HOSPITAL_COMMUNITY): Payer: Medicare Other | Admitting: Anesthesiology

## 2018-10-13 ENCOUNTER — Encounter (HOSPITAL_COMMUNITY): Payer: Self-pay | Admitting: Certified Registered Nurse Anesthetist

## 2018-10-13 ENCOUNTER — Ambulatory Visit (HOSPITAL_COMMUNITY)
Admit: 2018-10-13 | Discharge: 2018-10-13 | Disposition: A | Discharge status: Home / Self Care | Payer: Medicare Other | Attending: Neurology | Admitting: Neurology

## 2018-10-13 ENCOUNTER — Ambulatory Visit: Payer: Medicare Other | Admitting: Pain Medicine

## 2018-10-13 ENCOUNTER — Ambulatory Visit (HOSPITAL_COMMUNITY)
Admission: RE | Admit: 2018-10-13 | Discharge: 2018-10-13 | Disposition: A | Discharge status: Home / Self Care | Payer: Medicare Other | Source: Ambulatory Visit | Attending: Neurology | Admitting: Neurology

## 2018-10-13 ENCOUNTER — Other Ambulatory Visit: Payer: Self-pay

## 2018-10-13 ENCOUNTER — Encounter (HOSPITAL_COMMUNITY): Admission: RE | Disposition: A | Discharge status: Home / Self Care | Payer: Self-pay | Source: Ambulatory Visit

## 2018-10-13 DIAGNOSIS — F418 Other specified anxiety disorders: Secondary | ICD-10-CM | POA: Diagnosis not present

## 2018-10-13 DIAGNOSIS — I10 Essential (primary) hypertension: Secondary | ICD-10-CM | POA: Diagnosis not present

## 2018-10-13 DIAGNOSIS — R569 Unspecified convulsions: Secondary | ICD-10-CM | POA: Diagnosis not present

## 2018-10-13 DIAGNOSIS — G35 Multiple sclerosis: Secondary | ICD-10-CM

## 2018-10-13 DIAGNOSIS — Z886 Allergy status to analgesic agent status: Secondary | ICD-10-CM | POA: Insufficient documentation

## 2018-10-13 DIAGNOSIS — Z87891 Personal history of nicotine dependence: Secondary | ICD-10-CM | POA: Insufficient documentation

## 2018-10-13 DIAGNOSIS — Z888 Allergy status to other drugs, medicaments and biological substances status: Secondary | ICD-10-CM | POA: Insufficient documentation

## 2018-10-13 DIAGNOSIS — G8929 Other chronic pain: Secondary | ICD-10-CM | POA: Insufficient documentation

## 2018-10-13 DIAGNOSIS — F329 Major depressive disorder, single episode, unspecified: Secondary | ICD-10-CM | POA: Insufficient documentation

## 2018-10-13 DIAGNOSIS — Z885 Allergy status to narcotic agent status: Secondary | ICD-10-CM | POA: Diagnosis not present

## 2018-10-13 DIAGNOSIS — M1990 Unspecified osteoarthritis, unspecified site: Secondary | ICD-10-CM | POA: Insufficient documentation

## 2018-10-13 DIAGNOSIS — K219 Gastro-esophageal reflux disease without esophagitis: Secondary | ICD-10-CM | POA: Insufficient documentation

## 2018-10-13 DIAGNOSIS — F419 Anxiety disorder, unspecified: Secondary | ICD-10-CM | POA: Diagnosis not present

## 2018-10-13 DIAGNOSIS — Z6841 Body Mass Index (BMI) 40.0 and over, adult: Secondary | ICD-10-CM | POA: Insufficient documentation

## 2018-10-13 DIAGNOSIS — J45909 Unspecified asthma, uncomplicated: Secondary | ICD-10-CM | POA: Diagnosis not present

## 2018-10-13 HISTORY — PX: RADIOLOGY WITH ANESTHESIA: SHX6223

## 2018-10-13 HISTORY — DX: Attention-deficit hyperactivity disorder, unspecified type: F90.9

## 2018-10-13 LAB — CBC
HCT: 41.9 % (ref 36.0–46.0)
Hemoglobin: 13.6 g/dL (ref 12.0–15.0)
MCH: 29.4 pg (ref 26.0–34.0)
MCHC: 32.5 g/dL (ref 30.0–36.0)
MCV: 90.7 fL (ref 80.0–100.0)
Platelets: 380 10*3/uL (ref 150–400)
RBC: 4.62 MIL/uL (ref 3.87–5.11)
RDW: 13 % (ref 11.5–15.5)
WBC: 5.1 10*3/uL (ref 4.0–10.5)
nRBC: 0 % (ref 0.0–0.2)

## 2018-10-13 LAB — BASIC METABOLIC PANEL
Anion gap: 11 (ref 5–15)
BUN: 11 mg/dL (ref 6–20)
CO2: 23 mmol/L (ref 22–32)
Calcium: 8.8 mg/dL — ABNORMAL LOW (ref 8.9–10.3)
Chloride: 105 mmol/L (ref 98–111)
Creatinine, Ser: 0.75 mg/dL (ref 0.44–1.00)
GFR calc Af Amer: >60 mL/min (ref >60)
GFR calc non Af Amer: >60 mL/min (ref >60)
Glucose, Bld: 101 mg/dL — ABNORMAL HIGH (ref 70–99)
Potassium: 3.4 mmol/L — ABNORMAL LOW (ref 3.5–5.1)
Sodium: 139 mmol/L (ref 135–145)

## 2018-10-13 LAB — POCT PREGNANCY, URINE: Preg Test, Ur: NEGATIVE

## 2018-10-13 SURGERY — MRI WITH ANESTHESIA
Anesthesia: General

## 2018-10-13 MED ORDER — ONDANSETRON HCL 4 MG/2ML IJ SOLN: 4.0000 mg | Freq: Once | INTRAMUSCULAR | Status: DC | PRN

## 2018-10-13 MED ORDER — FENTANYL CITRATE (PF) 100 MCG/2ML IJ SOLN
INTRAMUSCULAR | Status: AC
  Filled 2018-10-13: qty 2

## 2018-10-13 MED ORDER — FENTANYL CITRATE (PF) 100 MCG/2ML IJ SOLN
25.0000 ug | INTRAMUSCULAR | Status: DC | PRN
  Administered 2018-10-13: 50 ug via INTRAVENOUS

## 2018-10-13 MED ORDER — GADOBUTROL 1 MMOL/ML IV SOLN
10.0000 mL | Freq: Once | INTRAVENOUS | Status: AC | PRN
  Administered 2018-10-13: 10 mL via INTRAVENOUS

## 2018-10-13 MED ORDER — LACTATED RINGERS IV SOLN
INTRAVENOUS | Status: DC
  Administered 2018-10-13: 1000 mL via INTRAVENOUS

## 2018-10-13 NOTE — Anesthesia Postprocedure Evaluation (Signed)
Anesthesia Post Note  Patient: Emily [name]  Procedure(s) Performed: MRI OF BRAIN WITH AND WITHOUT CONTRAST (N/A )     Patient location during evaluation: PACU Anesthesia Type: General Level of consciousness: awake and alert Pain management: pain level controlled Vital Signs Assessment: post-procedure vital signs reviewed and stable Respiratory status: spontaneous breathing, nonlabored ventilation, respiratory function stable and patient connected to nasal cannula oxygen Cardiovascular status: blood pressure returned to baseline and stable Postop Assessment: no apparent nausea or vomiting Anesthetic complications: no    Last Vitals:  Vitals:   10/13/18 1010 10/13/18 1040  BP: (!) 133/101   Pulse: 75   Resp: 15   Temp:  (!) 36.2 C  SpO2: 95%     Last Pain:  Vitals:   10/13/18 1010  PainSc: 0-No pain                 Tiajuana Amass

## 2018-10-13 NOTE — Progress Notes (Signed)
MRI screening sheet tubed to 17.

## 2018-10-13 NOTE — Anesthesia Preprocedure Evaluation (Signed)
Anesthesia Evaluation  Patient identified by MRN, date of birth, ID band Patient awake    Reviewed: Allergy & Precautions, NPO status , Patient's Chart, lab work & pertinent test results  Airway Mallampati: II  TM Distance: >3 FB Neck ROM: Full    Dental  (+) Loose, Poor Dentition   Pulmonary asthma , former smoker,    Pulmonary exam normal breath sounds clear to auscultation       Cardiovascular hypertension, Pt. 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 arthritis   Abdominal (+) + obese,   Peds  Hematology negative hematology ROS (+)   Anesthesia Other Findings   Reproductive/Obstetrics                             Anesthesia Physical  Anesthesia Plan  ASA: III  Anesthesia Plan: General   Post-op Pain Management:    Induction: Intravenous  PONV Risk Score and Plan: 3 and Midazolam, Ondansetron and Treatment may vary due to age or medical condition  Airway Management Planned: LMA  Additional Equipment:   Intra-op Plan:   Post-operative Plan: Extubation in OR  Informed Consent: I have reviewed the patients History and Physical, chart, labs and discussed the procedure including the risks, benefits and alternatives for the proposed anesthesia with the patient or authorized representative who has indicated his/her understanding and acceptance.      Dental advisory given  Plan Discussed with: CRNA and Surgeon  Anesthesia Plan Comments: (   )        Anesthesia Quick Evaluation

## 2018-10-13 NOTE — Transfer of Care (Signed)
Immediate Anesthesia Transfer of Care Note  Patient: Emily [name]  Procedure(s) Performed: MRI OF BRAIN WITH AND WITHOUT CONTRAST (N/A )  Patient Location: PACU  Anesthesia Type:General  Level of Consciousness: awake, patient cooperative and responds to stimulation  Airway & Oxygen Therapy: Patient Spontanous Breathing  Post-op Assessment: Report given to RN and Post -op Vital signs reviewed and stable  Post vital signs: Reviewed and stable  Last Vitals:  Vitals Value Taken Time  BP    Temp 36.2 C 10/13/18 1040  Pulse    Resp    SpO2      Last Pain:  Vitals:   10/13/18 1010  PainSc: 0-No pain      Patients Stated Pain Goal: 6 ([id number])  Complications: No apparent anesthesia complications

## 2018-10-14 ENCOUNTER — Encounter (HOSPITAL_COMMUNITY): Payer: Self-pay | Admitting: Radiology

## 2018-10-14 ENCOUNTER — Ambulatory Visit: Payer: Medicare Other | Admitting: Pain Medicine

## 2018-10-14 MED ORDER — DRONABINOL 2.5 MG PO CAPS: 2.5000 mg | ORAL_CAPSULE | Freq: Two times a day (BID) | ORAL | 1 refills | Status: DC

## 2018-10-14 MED FILL — Lidocaine HCl Local Soln Prefilled Syringe 100 MG/5ML (2%): INTRAMUSCULAR | Qty: 5 | Status: AC

## 2018-10-14 MED FILL — Midazolam HCl Inj 2 MG/2ML (Base Equivalent): INTRAMUSCULAR | Qty: 2 | Status: AC

## 2018-10-14 MED FILL — Propofol IV Emul 500 MG/50ML (10 MG/ML): INTRAVENOUS | Qty: 30 | Status: AC

## 2018-10-14 MED FILL — Ondansetron HCl Inj 4 MG/2ML (2 MG/ML): INTRAMUSCULAR | Qty: 2 | Status: AC

## 2018-10-14 MED FILL — Lactated Ringer's Solution: INTRAVENOUS | Qty: 1000 | Status: AC

## 2018-10-16 DIAGNOSIS — Z872 Personal history of diseases of the skin and subcutaneous tissue: Secondary | ICD-10-CM | POA: Diagnosis not present

## 2018-10-16 DIAGNOSIS — L02211 Cutaneous abscess of abdominal wall: Secondary | ICD-10-CM | POA: Diagnosis not present

## 2018-10-16 DIAGNOSIS — Z4801 Encounter for change or removal of surgical wound dressing: Secondary | ICD-10-CM | POA: Diagnosis not present

## 2018-10-22 DIAGNOSIS — F445 Conversion disorder with seizures or convulsions: Secondary | ICD-10-CM | POA: Diagnosis not present

## 2018-10-22 DIAGNOSIS — F419 Anxiety disorder, unspecified: Secondary | ICD-10-CM | POA: Diagnosis not present

## 2018-10-22 DIAGNOSIS — E876 Hypokalemia: Secondary | ICD-10-CM | POA: Diagnosis not present

## 2018-10-22 DIAGNOSIS — G35 Multiple sclerosis: Secondary | ICD-10-CM | POA: Diagnosis not present

## 2018-10-22 DIAGNOSIS — R42 Dizziness and giddiness: Secondary | ICD-10-CM | POA: Diagnosis not present

## 2018-10-22 DIAGNOSIS — Z79899 Other long term (current) drug therapy: Secondary | ICD-10-CM | POA: Diagnosis not present

## 2018-10-22 DIAGNOSIS — Z933 Colostomy status: Secondary | ICD-10-CM | POA: Diagnosis not present

## 2018-10-22 DIAGNOSIS — Z87891 Personal history of nicotine dependence: Secondary | ICD-10-CM | POA: Diagnosis not present

## 2018-10-22 DIAGNOSIS — R410 Disorientation, unspecified: Secondary | ICD-10-CM | POA: Diagnosis not present

## 2018-10-22 DIAGNOSIS — Z888 Allergy status to other drugs, medicaments and biological substances status: Secondary | ICD-10-CM | POA: Diagnosis not present

## 2018-10-22 DIAGNOSIS — R531 Weakness: Secondary | ICD-10-CM | POA: Diagnosis not present

## 2018-10-22 DIAGNOSIS — Z885 Allergy status to narcotic agent status: Secondary | ICD-10-CM | POA: Diagnosis not present

## 2018-10-22 DIAGNOSIS — F329 Major depressive disorder, single episode, unspecified: Secondary | ICD-10-CM | POA: Diagnosis not present

## 2018-10-22 DIAGNOSIS — R0689 Other abnormalities of breathing: Secondary | ICD-10-CM | POA: Diagnosis not present

## 2018-10-22 DIAGNOSIS — R404 Transient alteration of awareness: Secondary | ICD-10-CM | POA: Diagnosis not present

## 2018-10-23 ENCOUNTER — Telehealth: Payer: Self-pay | Admitting: Neurology

## 2018-10-23 DIAGNOSIS — E876 Hypokalemia: Secondary | ICD-10-CM | POA: Diagnosis not present

## 2018-10-23 DIAGNOSIS — Z6841 Body Mass Index (BMI) 40.0 and over, adult: Secondary | ICD-10-CM | POA: Diagnosis not present

## 2018-10-23 DIAGNOSIS — Z713 Dietary counseling and surveillance: Secondary | ICD-10-CM | POA: Diagnosis not present

## 2018-10-23 DIAGNOSIS — Z299 Encounter for prophylactic measures, unspecified: Secondary | ICD-10-CM | POA: Diagnosis not present

## 2018-10-23 DIAGNOSIS — I10 Essential (primary) hypertension: Secondary | ICD-10-CM | POA: Diagnosis not present

## 2018-10-23 NOTE — Telephone Encounter (Signed)
Patient is calling in to try and get an appt. I noticed she has had a lot of these lately. She said she is feeling "Shocked" all over and she is dizzy. Hard to walk. Wondering if she needs appt with you or she said to be admitted. She went to the ER last night. Does she need another VV? Thanks!

## 2018-10-23 NOTE — Telephone Encounter (Signed)
Patient is wanting Dr. Delice Lesch to admit her to the [facility] in [place]. Thanks!

## 2018-10-23 NOTE — Telephone Encounter (Signed)
I don't have any admitting privileges, if she needs admission, she needs to go to the hospital for evaluation. Thanks

## 2018-10-23 NOTE — Telephone Encounter (Signed)
Patient has called regarding her having new symptoms of Dizziness and almost falling. She also said that she has this burning feeling (shocking) and shaking feeling. She said if she cannot come in the office then can she be hospitalized? She said she cannot take care off her daughter like this. She has "Smothering" like symptoms. Please call [phone]. Thanks

## 2018-10-23 NOTE — Telephone Encounter (Signed)
Per Dr. Delice Lesch there are no work in appts. Pt will need to go to the ER.  Pts mom informed.

## 2018-10-24 ENCOUNTER — Encounter (HOSPITAL_COMMUNITY): Payer: Self-pay | Admitting: Emergency Medicine

## 2018-10-24 ENCOUNTER — Inpatient Hospital Stay (HOSPITAL_COMMUNITY)
Admission: EM | Admit: 2018-10-24 | Discharge: 2018-10-27 | DRG: 059 | Disposition: A | Discharge status: Home / Self Care | Payer: Medicare Other | Attending: Internal Medicine | Admitting: Internal Medicine

## 2018-10-24 ENCOUNTER — Other Ambulatory Visit: Payer: Self-pay

## 2018-10-24 DIAGNOSIS — Z8719 Personal history of other diseases of the digestive system: Secondary | ICD-10-CM

## 2018-10-24 DIAGNOSIS — M5136 Other intervertebral disc degeneration, lumbar region: Secondary | ICD-10-CM | POA: Diagnosis not present

## 2018-10-24 DIAGNOSIS — Z885 Allergy status to narcotic agent status: Secondary | ICD-10-CM

## 2018-10-24 DIAGNOSIS — Z6841 Body Mass Index (BMI) 40.0 and over, adult: Secondary | ICD-10-CM | POA: Diagnosis not present

## 2018-10-24 DIAGNOSIS — M47812 Spondylosis without myelopathy or radiculopathy, cervical region: Secondary | ICD-10-CM | POA: Diagnosis present

## 2018-10-24 DIAGNOSIS — Z20828 Contact with and (suspected) exposure to other viral communicable diseases: Secondary | ICD-10-CM | POA: Diagnosis not present

## 2018-10-24 DIAGNOSIS — Z833 Family history of diabetes mellitus: Secondary | ICD-10-CM

## 2018-10-24 DIAGNOSIS — M4802 Spinal stenosis, cervical region: Secondary | ICD-10-CM | POA: Diagnosis present

## 2018-10-24 DIAGNOSIS — J45909 Unspecified asthma, uncomplicated: Secondary | ICD-10-CM | POA: Diagnosis not present

## 2018-10-24 DIAGNOSIS — Z87442 Personal history of urinary calculi: Secondary | ICD-10-CM

## 2018-10-24 DIAGNOSIS — Z8249 Family history of ischemic heart disease and other diseases of the circulatory system: Secondary | ICD-10-CM

## 2018-10-24 DIAGNOSIS — M5030 Other cervical disc degeneration, unspecified cervical region: Secondary | ICD-10-CM | POA: Diagnosis present

## 2018-10-24 DIAGNOSIS — F339 Major depressive disorder, recurrent, unspecified: Secondary | ICD-10-CM | POA: Diagnosis not present

## 2018-10-24 DIAGNOSIS — D473 Essential (hemorrhagic) thrombocythemia: Secondary | ICD-10-CM | POA: Diagnosis present

## 2018-10-24 DIAGNOSIS — F410 Panic disorder [episodic paroxysmal anxiety] without agoraphobia: Secondary | ICD-10-CM | POA: Diagnosis present

## 2018-10-24 DIAGNOSIS — Z793 Long term (current) use of hormonal contraceptives: Secondary | ICD-10-CM

## 2018-10-24 DIAGNOSIS — G5603 Carpal tunnel syndrome, bilateral upper limbs: Secondary | ICD-10-CM | POA: Diagnosis present

## 2018-10-24 DIAGNOSIS — M170 Bilateral primary osteoarthritis of knee: Secondary | ICD-10-CM | POA: Diagnosis not present

## 2018-10-24 DIAGNOSIS — Z886 Allergy status to analgesic agent status: Secondary | ICD-10-CM

## 2018-10-24 DIAGNOSIS — I10 Essential (primary) hypertension: Secondary | ICD-10-CM | POA: Diagnosis present

## 2018-10-24 DIAGNOSIS — E876 Hypokalemia: Secondary | ICD-10-CM | POA: Diagnosis not present

## 2018-10-24 DIAGNOSIS — G35 Multiple sclerosis: Principal | ICD-10-CM | POA: Diagnosis present

## 2018-10-24 DIAGNOSIS — F411 Generalized anxiety disorder: Secondary | ICD-10-CM | POA: Diagnosis present

## 2018-10-24 DIAGNOSIS — G8929 Other chronic pain: Secondary | ICD-10-CM | POA: Diagnosis not present

## 2018-10-24 DIAGNOSIS — K760 Fatty (change of) liver, not elsewhere classified: Secondary | ICD-10-CM | POA: Diagnosis not present

## 2018-10-24 DIAGNOSIS — M47896 Other spondylosis, lumbar region: Secondary | ICD-10-CM | POA: Diagnosis present

## 2018-10-24 DIAGNOSIS — M47816 Spondylosis without myelopathy or radiculopathy, lumbar region: Secondary | ICD-10-CM | POA: Diagnosis present

## 2018-10-24 DIAGNOSIS — G43909 Migraine, unspecified, not intractable, without status migrainosus: Secondary | ICD-10-CM | POA: Diagnosis present

## 2018-10-24 DIAGNOSIS — K219 Gastro-esophageal reflux disease without esophagitis: Secondary | ICD-10-CM | POA: Diagnosis not present

## 2018-10-24 DIAGNOSIS — Z87891 Personal history of nicotine dependence: Secondary | ICD-10-CM

## 2018-10-24 DIAGNOSIS — F445 Conversion disorder with seizures or convulsions: Secondary | ICD-10-CM | POA: Diagnosis present

## 2018-10-24 DIAGNOSIS — G629 Polyneuropathy, unspecified: Secondary | ICD-10-CM | POA: Diagnosis present

## 2018-10-24 DIAGNOSIS — R3915 Urgency of urination: Secondary | ICD-10-CM | POA: Diagnosis present

## 2018-10-24 DIAGNOSIS — M5134 Other intervertebral disc degeneration, thoracic region: Secondary | ICD-10-CM | POA: Diagnosis present

## 2018-10-24 DIAGNOSIS — R531 Weakness: Secondary | ICD-10-CM | POA: Diagnosis not present

## 2018-10-24 DIAGNOSIS — Z888 Allergy status to other drugs, medicaments and biological substances status: Secondary | ICD-10-CM

## 2018-10-24 DIAGNOSIS — M47817 Spondylosis without myelopathy or radiculopathy, lumbosacral region: Secondary | ICD-10-CM | POA: Diagnosis not present

## 2018-10-24 DIAGNOSIS — Z91048 Other nonmedicinal substance allergy status: Secondary | ICD-10-CM

## 2018-10-24 DIAGNOSIS — Z9049 Acquired absence of other specified parts of digestive tract: Secondary | ICD-10-CM

## 2018-10-24 DIAGNOSIS — F419 Anxiety disorder, unspecified: Secondary | ICD-10-CM | POA: Diagnosis present

## 2018-10-24 DIAGNOSIS — F4310 Post-traumatic stress disorder, unspecified: Secondary | ICD-10-CM | POA: Diagnosis present

## 2018-10-24 DIAGNOSIS — Z79899 Other long term (current) drug therapy: Secondary | ICD-10-CM

## 2018-10-24 DIAGNOSIS — Z90710 Acquired absence of both cervix and uterus: Secondary | ICD-10-CM

## 2018-10-24 DIAGNOSIS — Z8261 Family history of arthritis: Secondary | ICD-10-CM

## 2018-10-24 DIAGNOSIS — F909 Attention-deficit hyperactivity disorder, unspecified type: Secondary | ICD-10-CM | POA: Diagnosis present

## 2018-10-24 DIAGNOSIS — R569 Unspecified convulsions: Secondary | ICD-10-CM | POA: Diagnosis present

## 2018-10-24 DIAGNOSIS — R52 Pain, unspecified: Secondary | ICD-10-CM | POA: Diagnosis not present

## 2018-10-24 NOTE — ED Notes (Signed)
Pt provided bedside commode 

## 2018-10-24 NOTE — ED Provider Notes (Signed)
[facility] EMERGENCY DEPARTMENT Provider Note   CSN: [id number] Arrival date & time: 10/24/18  1519     History   Chief Complaint Chief Complaint  Patient presents with  . Multiple Sclerosis    HPI Oktober L Jarnagin is a 38 y.o. female.     Patient has a known history of multiple sclerosis.  Followed by [facility] neurology.  Last seen by them in a teleconsult on October 5.  And last had MRI done October 6.  The MRI had no acute changes.  Patient is also known to have some behavioral health issues as well as chronic pain issues and history of headaches.  According to the neurology note patient is also followed by pain management and by behavioral health.  Patient has had no recent changes in her medications and when they's interviewed her on October 5 she was doing well.  Last seen in the emergency department on September 24 at [place] [facility].  Patient is on a care plan for the emergency department is and is not to receive any narcotics.  Patient requested narcotics repeatedly explain why she could have them.  Then patient wanted to receive ketamine.  Had teleneurology see her here to help sort out the need for MRI or not.  Teleneurology is recommending MRI will need to transfer her to [facility] ED to have MRI brain and cervical spine with and without.  Patient's complaint here today is increased leg weakness started a few days ago was seen in the [facility] emergency department on Thursday started on steroids.  Took 60 mg yesterday 60 mg today.  Stated that normally makes things better and has not.  The rest of her steroid treatment was a taper.  Says she is got electrical shooting pain up and down her legs.  States that symptoms started 2 days ago.  Patient states she is also having difficulty voiding she has to strain hard to get her urine out but she does feel like she is emptying her bladder.     Past Medical History:  Diagnosis Date  . ADHD   . Anxiety   . Asthma as a child  . Chronic back pain   .  Chronic pain   . Conversion disorder   . DDD (degenerative disc disease), cervical   . Depression   . External hemorrhoid   . Fatty liver   . GERD (gastroesophageal reflux disease)   . History of kidney stones   . HTN (hypertension) 02/27/2015  . JC virus antibody positive   . Migraines   . MS (multiple sclerosis) ([place])    06-2006  . Neuropathy   . OA (osteoarthritis)   . Ovarian cyst   . Panic attack   . Perforated bowel ([place]) 2009   colostomy bag for 3 motnhs  . 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 PTSD (post-traumatic stress disorder)   . S/P emergency C-section   . Seasonal allergies   . Urinary urgency   . Vertigo   . Wears contact lenses   . Wears glasses     Patient Active Problem List   Diagnosis Date Noted  . Open wound of abdominal wall, anterior, complicated, sequela [id number]  . Postoperative wound infection 09/20/2018  . Acute postoperative pain 08/25/2018  . History of substance use disorder 07/19/2018  . Chronic nausea 04/13/2018  . Nausea with vomiting 03/23/2018  . Pharmacologic therapy 03/16/2018  . Disorder of skeletal system 03/16/2018  .  Problems influencing health status 03/16/2018  . Abnormal MRI, cervical spine (03/06/17) 03/16/2018  . Morbid obesity with BMI of 45.0-49.9, adult ([place]) 03/16/2018  . Chronic pain of both knees 02/18/2018  . Cervical facet hypertrophy 04/29/2017  . Cervical facet syndrome (Bilateral) (R>L) 04/29/2017  . Spondylosis without myelopathy or radiculopathy, cervical region 04/29/2017  . Spondylosis without myelopathy or radiculopathy, lumbosacral region 03/04/2017  . Cervicogenic headache 02/10/2017  . Occipital headache 02/10/2017  . Trigger point with back pain (Left) 02/10/2017  . History of fainting (vasovagal) 02/10/2017  . History of vasovagal syncope 02/10/2017  . Acute left-sided low back pain without sciatica 02/10/2017  . Neurogenic pain 01/23/2017  . Chronic  musculoskeletal pain 12/02/2016  . Cervical radiculitis (Bilateral) 11/12/2016  . Chronic upper back pain (Primary Area of Pain) (Bilateral) (R>L) 11/01/2016  . Chronic hand pain [facility] Area of Pain) (Bilateral) (L>R) 11/01/2016  . Chronic wrist pain [facility] Area of Pain) (Bilateral) (L>R) 11/01/2016  . Chronic foot/toes pain (Fourth Area of Pain) (Bilateral) (R>L) 11/01/2016  . Chronic upper extremity pain (Secondary Area of Pain) (Bilateral) (L>R) 11/01/2016  . Long term prescription benzodiazepine use 11/01/2016  . 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 Cervical foraminal stenosis (Bilateral) (C6-7) 11/01/2016  . 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 DDD (degenerative disc disease), thoracic 11/01/2016  . DDD (degenerative disc disease), lumbar 11/01/2016  . Lumbar facet arthropathy 11/01/2016  . Lumbar facet syndrome (Bilateral) (R>L) 11/01/2016  . Vitamin D deficiency 10/28/2016  . Opiate use 07/23/2016  . Chronic neck pain (Primary Area of Pain) (Bilateral) (R>L) 07/23/2016  . Chronic low back pain (Secondary Area of Pain) (Bilateral) (L>R) 07/23/2016  . Chronic knee pain (Fifth Area of Pain) (Left) 07/23/2016  . Midline thoracic back pain 07/23/2016  . Chronic lower extremity pain (Bilateral) 07/23/2016  . Incarcerated incisional hernia 02/16/2016  . Incisional hernia 02/15/2016  . Carpal tunnel syndrome [facility] Area of Pain) (Bilateral) (L>R) 11/13/2015  . Substance abuse ([place]) 09/07/2015  . Paresthesia 08/02/2015  . Chronic pain syndrome 08/02/2015  . Postpartum endometritis 07/21/2015  . PTSD (post-traumatic stress disorder) 05/16/2015  . Conversion disorder with seizures or convulsions 05/16/2015  . Severe episode of recurrent major depressive disorder, without psychotic features ([place]) 05/16/2015  . GAD (generalized anxiety disorder) 05/16/2015  . Difficult intravenous access 05/08/2015  . Right optic neuritis 04/27/2015  .  Conversion disorder with attacks or seizures, persistent, with psychological stressor 03/28/2015  . Migraine with aura and without status migrainosus, not intractable 03/28/2015  . Generalized anxiety disorder 03/28/2015  . HTN (hypertension) 02/27/2015  . Perforated bowel ([place]) 02/27/2015  . Depression 12/27/2014  . Multiple sclerosis ([place]) 12/27/2014  . Migraine 12/27/2014  . Seizure disorder ([place]) 12/27/2014  . Smoker 12/27/2014  . Obesity 12/27/2014  . DDD (degenerative disc disease), cervical 07/28/2014  . Major depressive disorder, single episode 11/02/2013  . Laryngopharyngeal reflux 10/25/2010  . Tonsillitis 10/25/2010    Past Surgical History:  Procedure Laterality Date  . ABDOMINAL SURGERY    . APPENDECTOMY    . BOWEL RESECTION  01/2007   with colostomy  . CESAREAN SECTION N/A 07/12/2015   Procedure: CESAREAN SECTION;  Surgeon: Guss Bunde, MD;  Location: [place];  Service: Obstetrics;  Laterality: N/A;  . COLONOSCOPY WITH PROPOFOL N/A 01/29/2017   Procedure: COLONOSCOPY WITH PROPOFOL;  Surgeon: Ileana Roup, MD;  Location: [facility];  Service: General;  Laterality: N/A;  . COLOSTOMY CLOSURE  04/2007  .  EVALUATION UNDER ANESTHESIA WITH HEMORRHOIDECTOMY N/A 06/11/2018   Procedure: ANORECTAL EXAM UNDER ANESTHESIA WITH HEMORRHOIDECTOMY;  Surgeon: Ileana Roup, MD;  Location: [place];  Service: General;  Laterality: N/A;  . EXCISIONAL HEMORRHOIDECTOMY    . EXTREMITY CYST EXCISION  1994   right leg  . HERNIA REPAIR    . INCISIONAL HERNIA REPAIR N/A 02/16/2016   Procedure: HERNIA REPAIR INCISIONAL;  Surgeon: Fanny Skates, MD;  Location: [facility];  Service: General;  Laterality: N/A;  . INSERTION OF MESH  02/16/2016   Procedure: INSERTION OF MESH;  Surgeon: Fanny Skates, MD;  Location: [facility];  Service: General;;  . RADIOLOGY WITH ANESTHESIA N/A 03/06/2017   Procedure: MRI WITH ANESTHESIA OF CERVICAL SPINE WITHOUT CONTRAST, MRI OF  LUMBAR SPINE WITHOUT CONTRAST;  Surgeon: Radiologist, Medication, MD;  Location: [place];  Service: Radiology;  Laterality: N/A;  . RADIOLOGY WITH ANESTHESIA N/A 09/15/2018   Procedure: MRI OF BRAIN WITH AND WITHOUT CONTRAST;  Surgeon: Radiologist, Medication, MD;  Location: [place];  Service: Radiology;  Laterality: N/A;  . RADIOLOGY WITH ANESTHESIA N/A 10/13/2018   Procedure: MRI OF BRAIN WITH AND WITHOUT CONTRAST;  Surgeon: Radiologist, Medication, MD;  Location: [place];  Service: Radiology;  Laterality: N/A;  . SCAR REVISION  01/21/2011   Procedure: SCAR REVISION;  Surgeon: Hermelinda Dellen;  Location: [place];  Service: Plastics;  Laterality: N/A;  exploration of scar of abdomen and repair of defect  . TOOTH EXTRACTION Left 10/2016  . TRANSRECTAL DRAINAGE OF PELVIC ABSCESS       OB History    Gravida  1   Para  1   Term      Preterm  1   AB      Living  1     SAB      TAB      Ectopic      Multiple  0   Live Births  1            Home Medications    Prior to Admission medications   Medication Sig Start Date End Date Taking? Authorizing Provider  albuterol (PROVENTIL HFA;VENTOLIN HFA) 108 (90 Base) MCG/ACT inhaler Inhale 2 puffs into the lungs every 6 (six) hours as needed for wheezing or shortness of breath. Patient taking differently: Inhale 2 puffs into the lungs every 4 (four) hours as needed for wheezing or shortness of breath.  02/15/15  Yes Teague Bobbye Morton, PA-C  amLODipine (NORVASC) 5 MG tablet Take 5 mg by mouth every morning.  01/23/16  Yes [provider]  atomoxetine (STRATTERA) 40 MG capsule Take 1 capsule (40 mg total) by mouth daily. 10/08/18 10/08/19 Yes Charlcie Cradle, MD  busPIRone (BUSPAR) 15 MG tablet Take 1 tablet (15 mg total) by mouth 3 (three) times daily. 09/17/18  Yes Charlcie Cradle, MD  diazepam (VALIUM) 5 MG tablet Take 1 tablet (5 mg total) by mouth every 12 (twelve) hours as needed for anxiety. 10/08/18 10/08/19  Yes Charlcie Cradle, MD  divalproex (DEPAKOTE) 500 MG DR tablet Take 1 tablet (500 mg total) by mouth at bedtime. 05/05/18  Yes Cameron Sprang, MD  dronabinol (MARINOL) 2.5 MG capsule Take 1 capsule (2.5 mg total) by mouth 2 (two) times daily before lunch and supper. 10/14/18  Yes Carlis Stable, NP  Fingolimod HCl (GILENYA) 0.5 MG CAPS Take 1 capsule (0.5 mg total) by mouth daily. Patient taking differently: Take 0.5 mg by mouth every morning.  05/05/18  Yes Cameron Sprang, MD  FLUoxetine (PROZAC) 40 MG capsule Take 2 capsules (80 mg total) by mouth every morning. 09/17/18 09/17/19 Yes Charlcie Cradle, MD  haloperidol (HALDOL) 1 MG tablet Take 1 tablet (1 mg total) by mouth 2 (two) times daily. 09/17/18  Yes Charlcie Cradle, MD  lithium carbonate (LITHOBID) 300 MG CR tablet Take 2 tablets (600 mg total) by mouth 2 (two) times daily. 09/17/18  Yes Charlcie Cradle, MD  loratadine (CLARITIN) 10 MG tablet Take 10 mg by mouth daily as needed for allergies.   Yes [provider]  methocarbamol (ROBAXIN) 750 MG tablet Take 1 tablet (750 mg total) by mouth 2 (two) times daily as needed for muscle spasms. 09/21/18 03/20/19 Yes Milinda Pointer, MD  Multiple Vitamins-Minerals (MULTIVITAMIN ADULT PO) Take 1 tablet by mouth daily.    Yes [provider]  NON FORMULARY Take 1 each by mouth at bedtime. CBD Gummy   Yes [provider]  omeprazole (PRILOSEC) 20 MG capsule TAKE 1 CAPSULE (20 MG TOTAL) BY MOUTH 2 (TWO) TIMES DAILY BEFORE A MEAL. 06/18/18  Yes Annitta Needs, NP  pregabalin (LYRICA) 300 MG capsule Take 1 capsule (300 mg total) by mouth at bedtime. 09/21/18 03/20/19 Yes Milinda Pointer, MD  topiramate (TOPAMAX) 100 MG tablet Take 1.5 tablets twice a day Patient taking differently: Take 150 mg by mouth 2 (two) times daily. Take 1.5 tablets twice a day 05/05/18  Yes Cameron Sprang, MD  AIMOVIG 140 MG/ML SOAJ Inject 140 mg into the muscle every 30 (thirty) days.  08/21/18   [provider]  EPINEPHrine (EPIPEN) 0.3 mg/0.3 mL SOAJ injection Inject 0.3 mg into the muscle as needed (allergic reaction). Reported on 03/28/2015    [provider]  etonogestrel (NEXPLANON) 68 MG IMPL implant 1 each by Subdermal route once.    [provider]    Family History Family History  Problem Relation Age of Onset  . Diabetes Mother   . Hypertension Mother   . Diabetes Father   . Hypertension Father   . Arthritis Father   . Cancer Maternal Grandmother   . Cancer Maternal Grandfather   . Alcohol abuse Neg Hx   . Anxiety disorder Neg Hx   . Bipolar disorder Neg Hx   . Drug abuse Neg Hx   . Depression Neg Hx   . Colon cancer Neg Hx     Social History Social History   Tobacco Use  . Smoking status: Former Smoker    Packs/day: 0.25    Years: 10.00    Pack years: 2.50    Types: Cigarettes    Quit date: 07/12/2015    Years since quitting: 3.2  . Smokeless tobacco: Never Used  Substance Use Topics  . Alcohol use: No    Alcohol/week: 0.0 standard drinks    Comment: Rare  . Drug use: No    Comment: marinol shows up as THC     Allergies   Amitriptyline, Baclofen, Duloxetine hcl, Gabapentin, Hydrocodone-acetaminophen, Magnesium salicylate, Modafinil, Monosodium glutamate, Other, Tizanidine, Alprazolam, Rizatriptan, Vicodin [hydrocodone-acetaminophen], Nsaids, Adhesive [tape], Ketorolac tromethamine, Lamotrigine, and Tramadol   Review of Systems Review of Systems  Constitutional: Negative for chills and fever.  HENT: Negative for congestion, rhinorrhea and sore throat.   Eyes: Negative for visual disturbance.  Respiratory: Negative for cough and shortness of breath.   Cardiovascular: Negative for chest pain and leg swelling.  Gastrointestinal: Negative for abdominal pain, diarrhea, nausea and vomiting.  Genitourinary:  Positive for difficulty urinating. Negative for dysuria.  Musculoskeletal: Negative for back pain and neck pain.  Skin: Negative  for rash.  Neurological: Positive for weakness and numbness. Negative for dizziness, light-headedness and headaches.  Hematological: Does not bruise/bleed easily.  Psychiatric/Behavioral: Negative for confusion.     Physical Exam Updated Vital Signs BP 128/65 (BP Location: Right Arm)   Pulse 97   Temp 98.1 F (36.7 C) (Oral)   Resp 16   Ht 1.6 m (5\' 3" )   Wt 120 kg   LMP 09/29/2018   SpO2 100%   BMI 46.86 kg/m   Physical Exam Vitals signs and nursing note reviewed.  Constitutional:      General: She is not in acute distress.    Appearance: Normal appearance. She is well-developed.  HENT:     Head: Normocephalic and atraumatic.  Eyes:     Extraocular Movements: Extraocular movements intact.     Conjunctiva/sclera: Conjunctivae normal.     Pupils: Pupils are equal, round, and reactive to light.  Neck:     Musculoskeletal: Normal range of motion and neck supple.  Cardiovascular:     Rate and Rhythm: Normal rate and regular rhythm.     Heart sounds: No murmur.  Pulmonary:     Effort: Pulmonary effort is normal. No respiratory distress.     Breath sounds: Normal breath sounds.  Abdominal:     Palpations: Abdomen is soft.     Tenderness: There is no abdominal tenderness.  Skin:    General: Skin is warm and dry.  Neurological:     Mental Status: She is alert and oriented to person, place, and time.     Cranial Nerves: No cranial nerve deficit.     Sensory: Sensory deficit present.     Motor: Weakness present.     Comments: Patient without any significant motor weakness to lower extremities.  May be a little bit of global weakness.  Does have some numbness and shooting pain predominately in the thigh area.  Vascularly intact distally.      ED Treatments / Results  Labs (all labs ordered are listed, but only abnormal results are displayed) Labs Reviewed - No data to display  EKG None  Radiology No results found.  Procedures Procedures (including critical care  time)  Medications Ordered in ED Medications - No data to display   Initial Impression / Assessment and Plan / ED Course  I have reviewed the triage vital signs and the nursing notes.  Pertinent labs & imaging results that were available during my care of the patient were reviewed by me and considered in my medical decision making (see chart for details).        Discussed with Dr. Sherry Ruffing at [facility] emergency department.  Patient will be transferred there for MRI with and without brain and C-spine for probable or possible MS flare.  If negative patient may be able to be discharged home if positive clearly will need to talk to neurology.  But will probably need to talk to neurology either way with the results.    Patient already on prednisone taper that was just started yesterday.  At 60 mg prednisone yesterday and 60 mg prednisone today.  Patient followed by [facility] neurology outpatient.  They saw her October 5 on a teleconsult and she also had MRI brain done October 6 which had no significant new findings.  However patient symptoms started just a few days ago.  Patient was also seen at the [facility]  emergency department in [facility] and this when she was started on steroids.  Did have patient interviewed by teleneurology here.  Patient symptoms in her lower extremities could be related to an MS flare.  Even though they are suspicious as well as I am that this may not be factual.  But we do have to rule it out.  Patient is on a care plan.  Patient is not to receive any narcotics.  Patient has asked for narcotic she explained why I explained why she cannot have them.  And patient also is followed by pain management even though she denies that.  In addition patient was seen weight transferring to bedside commode and was able to stand and move back into the ED bed.  Without any significant difficulty.  But she did come across as doing it kind of slow.   Final Clinical Impressions(s) / ED Diagnoses    Final diagnoses:  Multiple sclerosis [facility])    ED Discharge Orders    None       Fredia Sorrow, MD 10/24/18 2051

## 2018-10-24 NOTE — Consult Note (Signed)
[facility] TeleNeurology Consult Services  Stat Consult  Date of Service:   10/24/2018 19:45:53  Impression:     .  ?MS flare  Comments/Sign-Out: 53 F, h/o MS, here with several days of worsening diffuse paresthesias and pain, urinary retention, and BLE wkness. HEr most recent MRI brain on 10/6 showed no enhancement, but she reports that her sx started after that MRI. Her exam is notable for complete loss of movement in both legs. The staff there reported that she was able to move her legs when she transferred to the commode, but on my exam, she exhibits no volitional movement of her lower ext. Arms are normal with no drift. Given the nature of her sx, she needs to be ruled out for another MS flare.   PLAN  - MRI brain and C spine +/- contrast  - neuro to follow  --  Metrics: [facility] Notification Time: 10/24/2018 19:44:07 Stamp Time: 10/24/2018 19:45:53 Callback Response Time: 10/24/2018 19:47:49 Video Start Time: 10/24/2018 20:00:58  Disposition: Neurology Follow Up Recommended  Sign Out:     .  Discussed with Emergency Department Provider  ----------------------------------------------------------------------------------------------------  Chief Complaint: BLE pain  History of Present Illness: Patient is a 38 year old Female.  69 F, h/o MS, last seen by outpt neurologist on 10/5. Had MRI brain +/- contrast on 10/13/18 that showed no acute demyelination, who is here with new symptoms that started a few days after that MRI.   For the past 3-4 days, pt reports worsening pain and wkness. She feels "smothered by an invisible film", and is complaining of "burning skin, sharp pains throughout my body like being poked by a cattle prod." Furthermore she reports feeling weak in both her legs to the point where she reports being unable to lift either leg up off the bed.   She was seen at another ER 2 days ago and was given a prednisone taper. She was  prescribed 60 mg for the first 2 days (she took the 2nd 60 mg dose today) to be tapered down by 10 mg every 2 days. Her sx have worsened since and she now has tingling over head and face, numbness over hands and feet almost in a stocking glove distribution and going up her arms and legs. She also reports trouble using the bathroom, reporting that she has to strain real hard to void urine.  Examination: BP(128/65), Pulse(not recorded), Blood Glucose(101)  Neuro Exam:  General: Alert,Awake, Oriented to Time, Place, Person  Speech: Fluent  Language: Intact  Face: Symmetric  Facial Sensation: Reduced to touch  Visual Fields: Intact:  Extraocular Movements: Intact  Motor Exam: No Drift of the arms No volitional movements noted in her legs  Sensation: Reduced to LT throughout, also reports paresthesias and sharp shooting pains throughout  Coordination: Intact on FTN. Unable to perform HTS   Due to the immediate potential for life-threatening deterioration due to underlying acute neurologic illness, I spent 15 minutes providing critical care. This time includes time for face to face visit via telemedicine, review of medical records, imaging studies and discussion of findings with providers, the patient and/or family.   Dr Burtis Junes   [facility] [phone]   Case [id number]

## 2018-10-24 NOTE — ED Triage Notes (Signed)
Per EMS patient from home; called out for MS pain. Patient states "electrical pain shooting up and down body." States pain started last night.

## 2018-10-24 NOTE — ED Notes (Signed)
ED Provider at bedside. 

## 2018-10-24 NOTE — ED Notes (Signed)
Attempted to call [facility] to give report to the Charge Nurse on Pt transfer to their facility for MRI services. Informed Charge Nurse [facility] was already enroute to transfer Pt. Informed Charge Nurse at [facility] Dr. Harrell Gave Tegeler in [place] had already accepted Pt and had been in contact with [facility] Physician Dr. Rogene Houston. Phone call was discontinued before this RN could finish giving report.

## 2018-10-25 ENCOUNTER — Emergency Department (HOSPITAL_COMMUNITY): Payer: Medicare Other

## 2018-10-25 DIAGNOSIS — F410 Panic disorder [episodic paroxysmal anxiety] without agoraphobia: Secondary | ICD-10-CM | POA: Diagnosis not present

## 2018-10-25 DIAGNOSIS — K760 Fatty (change of) liver, not elsewhere classified: Secondary | ICD-10-CM | POA: Diagnosis not present

## 2018-10-25 DIAGNOSIS — G8929 Other chronic pain: Secondary | ICD-10-CM | POA: Diagnosis present

## 2018-10-25 DIAGNOSIS — Z6841 Body Mass Index (BMI) 40.0 and over, adult: Secondary | ICD-10-CM | POA: Diagnosis not present

## 2018-10-25 DIAGNOSIS — I639 Cerebral infarction, unspecified: Secondary | ICD-10-CM | POA: Diagnosis not present

## 2018-10-25 DIAGNOSIS — F909 Attention-deficit hyperactivity disorder, unspecified type: Secondary | ICD-10-CM

## 2018-10-25 DIAGNOSIS — G35 Multiple sclerosis: Secondary | ICD-10-CM | POA: Diagnosis present

## 2018-10-25 DIAGNOSIS — K219 Gastro-esophageal reflux disease without esophagitis: Secondary | ICD-10-CM | POA: Diagnosis not present

## 2018-10-25 DIAGNOSIS — G43611 Persistent migraine aura with cerebral infarction, intractable, with status migrainosus: Secondary | ICD-10-CM

## 2018-10-25 DIAGNOSIS — G43909 Migraine, unspecified, not intractable, without status migrainosus: Secondary | ICD-10-CM | POA: Diagnosis not present

## 2018-10-25 DIAGNOSIS — F419 Anxiety disorder, unspecified: Secondary | ICD-10-CM | POA: Diagnosis not present

## 2018-10-25 DIAGNOSIS — Z98891 History of uterine scar from previous surgery: Secondary | ICD-10-CM | POA: Insufficient documentation

## 2018-10-25 DIAGNOSIS — F339 Major depressive disorder, recurrent, unspecified: Secondary | ICD-10-CM | POA: Diagnosis not present

## 2018-10-25 DIAGNOSIS — F445 Conversion disorder with seizures or convulsions: Secondary | ICD-10-CM | POA: Diagnosis not present

## 2018-10-25 DIAGNOSIS — I10 Essential (primary) hypertension: Secondary | ICD-10-CM | POA: Diagnosis not present

## 2018-10-25 DIAGNOSIS — Z20828 Contact with and (suspected) exposure to other viral communicable diseases: Secondary | ICD-10-CM | POA: Diagnosis not present

## 2018-10-25 DIAGNOSIS — E876 Hypokalemia: Secondary | ICD-10-CM | POA: Diagnosis not present

## 2018-10-25 DIAGNOSIS — G629 Polyneuropathy, unspecified: Secondary | ICD-10-CM | POA: Diagnosis not present

## 2018-10-25 LAB — CBC WITH DIFFERENTIAL/PLATELET
Abs Immature Granulocytes: 0.03 10*3/uL (ref 0.00–0.07)
Basophils Absolute: 0 10*3/uL (ref 0.0–0.1)
Basophils Relative: 0 %
Eosinophils Absolute: 0 10*3/uL (ref 0.0–0.5)
Eosinophils Relative: 0 %
HCT: 42.4 % (ref 36.0–46.0)
Hemoglobin: 13.8 g/dL (ref 12.0–15.0)
Immature Granulocytes: 0 %
Lymphocytes Relative: 11 %
Lymphs Abs: 1 10*3/uL (ref 0.7–4.0)
MCH: 29.7 pg (ref 26.0–34.0)
MCHC: 32.5 g/dL (ref 30.0–36.0)
MCV: 91.2 fL (ref 80.0–100.0)
Monocytes Absolute: 0.6 10*3/uL (ref 0.1–1.0)
Monocytes Relative: 6 %
Neutro Abs: 7.6 10*3/uL (ref 1.7–7.7)
Neutrophils Relative %: 83 %
Platelets: 435 10*3/uL — ABNORMAL HIGH (ref 150–400)
RBC: 4.65 MIL/uL (ref 3.87–5.11)
RDW: 13 % (ref 11.5–15.5)
WBC: 9.1 10*3/uL (ref 4.0–10.5)
nRBC: 0 % (ref 0.0–0.2)

## 2018-10-25 LAB — BASIC METABOLIC PANEL
Anion gap: 9 (ref 5–15)
BUN: 11 mg/dL (ref 6–20)
CO2: 21 mmol/L — ABNORMAL LOW (ref 22–32)
Calcium: 9.1 mg/dL (ref 8.9–10.3)
Chloride: 108 mmol/L (ref 98–111)
Creatinine, Ser: 0.93 mg/dL (ref 0.44–1.00)
GFR calc Af Amer: >60 mL/min (ref >60)
GFR calc non Af Amer: >60 mL/min (ref >60)
Glucose, Bld: 96 mg/dL (ref 70–99)
Potassium: 3.4 mmol/L — ABNORMAL LOW (ref 3.5–5.1)
Sodium: 138 mmol/L (ref 135–145)

## 2018-10-25 LAB — LITHIUM LEVEL: Lithium Lvl: 0.3 mmol/L — ABNORMAL LOW (ref 0.60–1.20)

## 2018-10-25 LAB — VALPROIC ACID LEVEL: Valproic Acid Lvl: <10 ug/mL — ABNORMAL LOW (ref 50.0–100.0)

## 2018-10-25 LAB — SARS CORONAVIRUS 2 (TAT 6-24 HRS): SARS Coronavirus 2: NEGATIVE

## 2018-10-25 LAB — URINALYSIS, ROUTINE W REFLEX MICROSCOPIC
Bilirubin Urine: NEGATIVE
Glucose, UA: NEGATIVE mg/dL
Hgb urine dipstick: NEGATIVE
Ketones, ur: NEGATIVE mg/dL
Leukocytes,Ua: NEGATIVE
Nitrite: NEGATIVE
Protein, ur: NEGATIVE mg/dL
Specific Gravity, Urine: 1.019 (ref 1.005–1.030)
pH: 6 (ref 5.0–8.0)

## 2018-10-25 LAB — HIV ANTIBODY (ROUTINE TESTING W REFLEX): HIV Screen 4th Generation wRfx: NONREACTIVE

## 2018-10-25 MED ORDER — ACETAMINOPHEN 325 MG PO TABS: 650.0000 mg | ORAL_TABLET | Freq: Four times a day (QID) | ORAL | Status: DC | PRN

## 2018-10-25 MED ORDER — HALOPERIDOL 1 MG PO TABS
1.0000 mg | ORAL_TABLET | Freq: Two times a day (BID) | ORAL | Status: DC
  Administered 2018-10-25 – 2018-10-27 (×5): 1 mg via ORAL
  Filled 2018-10-25 (×6): qty 1

## 2018-10-25 MED ORDER — LITHIUM CARBONATE ER 300 MG PO TBCR
600.0000 mg | EXTENDED_RELEASE_TABLET | Freq: Two times a day (BID) | ORAL | Status: DC
  Administered 2018-10-25 – 2018-10-27 (×5): 600 mg via ORAL
  Filled 2018-10-25 (×6): qty 2

## 2018-10-25 MED ORDER — PREGABALIN 100 MG PO CAPS
300.0000 mg | ORAL_CAPSULE | Freq: Every day | ORAL | Status: DC
  Administered 2018-10-25 – 2018-10-27 (×2): 300 mg via ORAL
  Filled 2018-10-25 (×2): qty 3

## 2018-10-25 MED ORDER — BUSPIRONE HCL 5 MG PO TABS
15.0000 mg | ORAL_TABLET | Freq: Three times a day (TID) | ORAL | Status: DC
  Administered 2018-10-25 – 2018-10-27 (×6): 15 mg via ORAL
  Filled 2018-10-25 (×6): qty 1

## 2018-10-25 MED ORDER — PREDNISONE 20 MG PO TABS
50.0000 mg | ORAL_TABLET | Freq: Every day | ORAL | Status: DC
  Filled 2018-10-25: qty 2

## 2018-10-25 MED ORDER — FINGOLIMOD HCL 0.5 MG PO CAPS: 0.5000 mg | ORAL_CAPSULE | ORAL | Status: DC

## 2018-10-25 MED ORDER — ENOXAPARIN SODIUM 40 MG/0.4ML ~~LOC~~ SOLN
40.0000 mg | SUBCUTANEOUS | Status: DC
  Administered 2018-10-25 – 2018-10-27 (×3): 40 mg via SUBCUTANEOUS
  Filled 2018-10-25 (×3): qty 0.4

## 2018-10-25 MED ORDER — METHOCARBAMOL 500 MG PO TABS
750.0000 mg | ORAL_TABLET | Freq: Two times a day (BID) | ORAL | Status: DC | PRN
  Administered 2018-10-25 – 2018-10-26 (×3): 750 mg via ORAL
  Filled 2018-10-25 (×6): qty 2

## 2018-10-25 MED ORDER — ACETAMINOPHEN 650 MG RE SUPP: 650.0000 mg | Freq: Four times a day (QID) | RECTAL | Status: DC | PRN

## 2018-10-25 MED ORDER — TOPIRAMATE 25 MG PO TABS
150.0000 mg | ORAL_TABLET | Freq: Two times a day (BID) | ORAL | Status: DC
  Administered 2018-10-25 – 2018-10-27 (×5): 150 mg via ORAL
  Filled 2018-10-25 (×5): qty 2

## 2018-10-25 MED ORDER — ONDANSETRON HCL 4 MG PO TABS: 4.0000 mg | ORAL_TABLET | Freq: Four times a day (QID) | ORAL | Status: DC | PRN

## 2018-10-25 MED ORDER — ALBUTEROL SULFATE (2.5 MG/3ML) 0.083% IN NEBU: 2.5000 mg | INHALATION_SOLUTION | Freq: Four times a day (QID) | RESPIRATORY_TRACT | Status: DC | PRN

## 2018-10-25 MED ORDER — LORATADINE 10 MG PO TABS: 10.0000 mg | ORAL_TABLET | Freq: Every day | ORAL | Status: DC | PRN

## 2018-10-25 MED ORDER — DRONABINOL 2.5 MG PO CAPS
2.5000 mg | ORAL_CAPSULE | Freq: Two times a day (BID) | ORAL | Status: DC
  Administered 2018-10-25 – 2018-10-27 (×3): 2.5 mg via ORAL
  Filled 2018-10-25 (×3): qty 1

## 2018-10-25 MED ORDER — FLUOXETINE HCL 20 MG PO CAPS
80.0000 mg | ORAL_CAPSULE | ORAL | Status: DC
  Administered 2018-10-25 – 2018-10-27 (×3): 80 mg via ORAL
  Filled 2018-10-25 (×3): qty 4

## 2018-10-25 MED ORDER — PANTOPRAZOLE SODIUM 40 MG PO TBEC
40.0000 mg | DELAYED_RELEASE_TABLET | Freq: Every day | ORAL | Status: DC
  Administered 2018-10-25 – 2018-10-27 (×3): 40 mg via ORAL
  Filled 2018-10-25 (×3): qty 1

## 2018-10-25 MED ORDER — AMLODIPINE BESYLATE 5 MG PO TABS
5.0000 mg | ORAL_TABLET | ORAL | Status: DC
  Administered 2018-10-25 – 2018-10-27 (×3): 5 mg via ORAL
  Filled 2018-10-25 (×3): qty 1

## 2018-10-25 MED ORDER — ONDANSETRON HCL 4 MG/2ML IJ SOLN: 4.0000 mg | Freq: Four times a day (QID) | INTRAMUSCULAR | Status: DC | PRN

## 2018-10-25 MED ORDER — LABETALOL HCL 5 MG/ML IV SOLN: 5.0000 mg | INTRAVENOUS | Status: DC | PRN

## 2018-10-25 MED ORDER — DIAZEPAM 5 MG PO TABS
5.0000 mg | ORAL_TABLET | Freq: Two times a day (BID) | ORAL | Status: DC | PRN
  Administered 2018-10-25 – 2018-10-26 (×2): 5 mg via ORAL
  Filled 2018-10-25 (×2): qty 1

## 2018-10-25 MED ORDER — POTASSIUM CHLORIDE CRYS ER 20 MEQ PO TBCR
40.0000 meq | EXTENDED_RELEASE_TABLET | ORAL | Status: AC
  Administered 2018-10-25: 40 meq via ORAL
  Filled 2018-10-25: qty 2

## 2018-10-25 MED ORDER — DIVALPROEX SODIUM 500 MG PO DR TAB
500.0000 mg | DELAYED_RELEASE_TABLET | Freq: Every day | ORAL | Status: DC
  Administered 2018-10-25 – 2018-10-27 (×2): 500 mg via ORAL
  Filled 2018-10-25 (×2): qty 1

## 2018-10-25 NOTE — Progress Notes (Signed)
Patient crying requesting IV pain medications for chronic back, also request a medication that we don't keep on this floor that is used in anesthesia.  Patient adds that she fears aspirating on her spit and wants pain meds for that as well. Instructions given that nurse can use suction if needed and that there isn't a medication for that.  Day shift RN and I spoke with MD on call with clear instructions to keep medications as they are now, may give a valium now with robaxin.  Attempt to provide reassurance to patient and reinforce doctors instructions.  Patient sitting up in bed with MAE without difficulties, no obvious signs of physical distress, but Appears to be in psychological distress.  To evaluate response to valium and robaxin.

## 2018-10-25 NOTE — H&P (Signed)
History and Physical    Emily [name] DJ:[id number] DOB: [birth year] DOA: 10/24/2018  Referring MD/NP/PA: Ivor Costa, MD PCP: Monico Blitz, MD  Patient coming from: [facility] ED transfer  Chief Complaint: Burning sensation of skin  I have personally briefly reviewed patient's old medical records in [place]   HPI: Emily [name] is a 38 y.o. female with medical history significant of multiple sclerosis followed by [facility] neurology, chronic pain, hypertension, asthma, GERD, depression, anxiety, PTSD, panic attack, ADHD, pseudoseizure, and perforated bowel. She presented with multiple complaints including burning sensation of skin, numbness of face/hands/legs, bilateral lower extremity weakness, difficulty speaking, worsening migraine headaches, and difficulty breathing, urinary urgency.  At baseline she ambulates with use of a cane.  She recently had an MRI of the brain on 10/6 by her neurologist Dr. Delice Lesch which showed no enhancement.  Patient however states that all of her symptoms started shortly thereafter.  She was started on prednisone 60 mg to be tapered down 10 mg every 2 days.  However, patient reports that her symptoms worsened after taking 2 doses of the prednisone.   ED Course: Upon admission into the ED at [facility] patient was noted to be afebrile, blood pressure 128/65 -150/107, and all other vital signs maintained.  Labs significant for 435 and potassium 3.4.  Evaluated by telemetry neurology and recommended to have MRI of the brain and C-spine with and without contrast.  Transfer was requested for need of MRI.  However, patient unable to have MRI without anesthesia.  TRH called to admit.  Review of Systems  Constitutional: Negative for fever.  HENT: Negative for congestion and nosebleeds.   Eyes: Negative for photophobia and pain.  Respiratory: Positive for shortness of breath. Negative for cough.   Cardiovascular: Positive for chest pain.  Negative for leg swelling.  Gastrointestinal: Negative for abdominal pain, diarrhea, nausea and vomiting.  Genitourinary: Positive for urgency. Negative for hematuria.  Musculoskeletal: Positive for myalgias.  Skin: Negative for rash.  Neurological: Positive for sensory change, speech change, focal weakness and headaches.  Psychiatric/Behavioral: Negative for memory loss and substance abuse.    Past Medical History:  Diagnosis Date  . ADHD   . Anxiety   . Asthma as a child  . Chronic back pain   . Chronic pain   . Conversion disorder   . DDD (degenerative disc disease), cervical   . Depression   . External hemorrhoid   . Fatty liver   . GERD (gastroesophageal reflux disease)   . History of kidney stones   . HTN (hypertension) 02/27/2015  . JC virus antibody positive   . Migraines   . MS (multiple sclerosis) ([place])    06-2006  . Neuropathy   . OA (osteoarthritis)   . Ovarian cyst   . Panic attack   . Perforated bowel ([place]) 2009   colostomy bag for 3 motnhs  . 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 PTSD (post-traumatic stress disorder)   . S/P emergency C-section   . Seasonal allergies   . Urinary urgency   . Vertigo   . Wears contact lenses   . Wears glasses     Past Surgical History:  Procedure Laterality Date  . ABDOMINAL SURGERY    . APPENDECTOMY    . BOWEL RESECTION  01/2007   with colostomy  . CESAREAN SECTION N/A 07/12/2015   Procedure: CESAREAN SECTION;  Surgeon: Guss Bunde, MD;  Location: [place];  Service: Obstetrics;  Laterality: N/A;  . COLONOSCOPY WITH PROPOFOL N/A 01/29/2017   Procedure: COLONOSCOPY WITH PROPOFOL;  Surgeon: Ileana Roup, MD;  Location: [facility];  Service: General;  Laterality: N/A;  . COLOSTOMY CLOSURE  04/2007  . EVALUATION UNDER ANESTHESIA WITH HEMORRHOIDECTOMY N/A 06/11/2018   Procedure: ANORECTAL EXAM UNDER ANESTHESIA WITH HEMORRHOIDECTOMY;  Surgeon: Ileana Roup, MD;   Location: [place];  Service: General;  Laterality: N/A;  . EXCISIONAL HEMORRHOIDECTOMY    . EXTREMITY CYST EXCISION  1994   right leg  . HERNIA REPAIR    . INCISIONAL HERNIA REPAIR N/A 02/16/2016   Procedure: HERNIA REPAIR INCISIONAL;  Surgeon: Fanny Skates, MD;  [facility]: [facility];  Service: General;  Laterality: N/A;  . INSERTION OF MESH  02/16/2016   Procedure: INSERTION OF MESH;  Surgeon: Fanny Skates, MD;  Location: [facility];  Service: General;;  . RADIOLOGY WITH ANESTHESIA N/A 03/06/2017   Procedure: MRI WITH ANESTHESIA OF CERVICAL SPINE WITHOUT CONTRAST, MRI OF LUMBAR SPINE WITHOUT CONTRAST;  Surgeon: Radiologist, Medication, MD;  Location: [place];  Service: Radiology;  Laterality: N/A;  . RADIOLOGY WITH ANESTHESIA N/A 09/15/2018   Procedure: MRI OF BRAIN WITH AND WITHOUT CONTRAST;  Surgeon: Radiologist, Medication, MD;  Location: [place];  Service: Radiology;  Laterality: N/A;  . RADIOLOGY WITH ANESTHESIA N/A 10/13/2018   Procedure: MRI OF BRAIN WITH AND WITHOUT CONTRAST;  Surgeon: Radiologist, Medication, MD;  Location: [place];  Service: Radiology;  Laterality: N/A;  . SCAR REVISION  01/21/2011   Procedure: SCAR REVISION;  Surgeon: Hermelinda Dellen;  Location: [place];  Service: Plastics;  Laterality: N/A;  exploration of scar of abdomen and repair of defect  . TOOTH EXTRACTION Left 10/2016  . TRANSRECTAL DRAINAGE OF PELVIC ABSCESS       reports that she quit smoking about 3 years ago. Her smoking use included cigarettes. She has a 2.50 pack-year smoking history. She has never used smokeless tobacco. She reports that she does not drink alcohol or use drugs.  Allergies  Allergen Reactions  . Amitriptyline Hypertension and Other (See Comments)    hypertension  . Baclofen Hives and Shortness Of Breath  . Duloxetine Hcl Shortness Of Breath and Rash  . Gabapentin Shortness Of Breath and Rash  . Hydrocodone-Acetaminophen Hives and Nausea And Vomiting     Projectile vomiting  . Magnesium Salicylate Hives and Itching  . Modafinil Other (See Comments)    Seizures   . Monosodium Glutamate Anaphylaxis  . Other Shortness Of Breath, Rash and Other (See Comments)    MSG, beans (vomiting) MSG causes hives, itching, throat swelling  . Tizanidine Hives    Other reaction(s): GI Upset (intolerance)  . Alprazolam Other (See Comments)    Lethargy  . Rizatriptan Nausea And Vomiting and Other (See Comments)    GI upset, Projectile vomiting GI upset, Projectile vomiting  . Vicodin [Hydrocodone-Acetaminophen] Hives, Nausea And Vomiting and Other (See Comments)    Projectile vomiting  . Nsaids Other (See Comments)    Irritate stomach   . Adhesive [Tape] Other (See Comments)    Skin irritation Paper tape is ok  . Ketorolac Tromethamine Nausea Only    Toradol   . Lamotrigine Rash  . Tramadol Nausea And Vomiting    Other reaction(s): GI Upset (intolerance)    Family History  Problem Relation Age of Onset  . Diabetes Mother   . Hypertension Mother   . Diabetes Father   .  Hypertension Father   . Arthritis Father   . Cancer Maternal Grandmother   . Cancer Maternal Grandfather   . Alcohol abuse Neg Hx   . Anxiety disorder Neg Hx   . Bipolar disorder Neg Hx   . Drug abuse Neg Hx   . Depression Neg Hx   . Colon cancer Neg Hx     Prior to Admission medications   Medication Sig Start Date End Date Taking? Authorizing Provider  albuterol (PROVENTIL HFA;VENTOLIN HFA) 108 (90 Base) MCG/ACT inhaler Inhale 2 puffs into the lungs every 6 (six) hours as needed for wheezing or shortness of breath. Patient taking differently: Inhale 2 puffs into the lungs every 4 (four) hours as needed for wheezing or shortness of breath.  02/15/15  Yes Teague Bobbye Morton, PA-C  amLODipine (NORVASC) 5 MG tablet Take 5 mg by mouth every morning.  01/23/16  Yes [provider]  atomoxetine (STRATTERA) 40 MG capsule Take 1 capsule (40 mg total) by mouth daily.  10/08/18 10/08/19 Yes Charlcie Cradle, MD  busPIRone (BUSPAR) 15 MG tablet Take 1 tablet (15 mg total) by mouth 3 (three) times daily. 09/17/18  Yes Charlcie Cradle, MD  diazepam (VALIUM) 5 MG tablet Take 1 tablet (5 mg total) by mouth every 12 (twelve) hours as needed for anxiety. 10/08/18 10/08/19 Yes Charlcie Cradle, MD  divalproex (DEPAKOTE) 500 MG DR tablet Take 1 tablet (500 mg total) by mouth at bedtime. 05/05/18  Yes Cameron Sprang, MD  dronabinol (MARINOL) 2.5 MG capsule Take 1 capsule (2.5 mg total) by mouth 2 (two) times daily before lunch and supper. 10/14/18  Yes Carlis Stable, NP  Fingolimod HCl (GILENYA) 0.5 MG CAPS Take 1 capsule (0.5 mg total) by mouth daily. Patient taking differently: Take 0.5 mg by mouth every morning.  05/05/18  Yes Cameron Sprang, MD  FLUoxetine (PROZAC) 40 MG capsule Take 2 capsules (80 mg total) by mouth every morning. 09/17/18 09/17/19 Yes Charlcie Cradle, MD  haloperidol (HALDOL) 1 MG tablet Take 1 tablet (1 mg total) by mouth 2 (two) times daily. 09/17/18  Yes Charlcie Cradle, MD  lithium carbonate (LITHOBID) 300 MG CR tablet Take 2 tablets (600 mg total) by mouth 2 (two) times daily. 09/17/18  Yes Charlcie Cradle, MD  loratadine (CLARITIN) 10 MG tablet Take 10 mg by mouth daily as needed for allergies.   Yes [provider]  methocarbamol (ROBAXIN) 750 MG tablet Take 1 tablet (750 mg total) by mouth 2 (two) times daily as needed for muscle spasms. 09/21/18 03/20/19 Yes Milinda Pointer, MD  Multiple Vitamins-Minerals (MULTIVITAMIN ADULT PO) Take 1 tablet by mouth daily.    Yes [provider]  NON FORMULARY Take 1 each by mouth at bedtime. CBD Gummy   Yes [provider]  omeprazole (PRILOSEC) 20 MG capsule TAKE 1 CAPSULE (20 MG TOTAL) BY MOUTH 2 (TWO) TIMES DAILY BEFORE A MEAL. 06/18/18  Yes Annitta Needs, NP  pregabalin (LYRICA) 300 MG capsule Take 1 capsule (300 mg total) by mouth at bedtime. 09/21/18 03/20/19 Yes Milinda Pointer, MD   topiramate (TOPAMAX) 100 MG tablet Take 1.5 tablets twice a day Patient taking differently: Take 150 mg by mouth 2 (two) times daily. Take 1.5 tablets twice a day 05/05/18  Yes Cameron Sprang, MD  AIMOVIG 140 MG/ML SOAJ Inject 140 mg into the muscle every 30 (thirty) days.  08/21/18   [provider]  EPINEPHrine (EPIPEN) 0.3 mg/0.3 mL SOAJ injection Inject 0.3 mg into  the muscle as needed (allergic reaction). Reported on 03/28/2015    [provider]  etonogestrel (NEXPLANON) 68 MG IMPL implant 1 each by Subdermal route once.    [provider]    Physical Exam:  Constitutional: Middle-agedfemale currently in NAD, calm, comfortable Vitals:   10/24/18 2210 10/24/18 2211 10/25/18 0210 10/25/18 0754  BP: (!) 144/83 (!) 144/83 138/81 (!) 150/107  Pulse: 95 92 90 91  Resp: 16 18 18 12   Temp:    (!) 97.5 F (36.4 C)  TempSrc:    Oral  SpO2: 95% 97% 98% 100%  Weight:      Height:       Eyes: PERRL, lids and conjunctivae normal ENMT: Mucous membranes are moist. Posterior pharynx clear of any exudate or lesions.  Neck: normal, supple, no masses, no thyromegaly Respiratory: clear to auscultation bilaterally, no wheezing, no crackles. Normal respiratory effort. No accessory muscle use.  Cardiovascular: Regular rate and rhythm, no murmurs / rubs / gallops. No extremity edema. 2+ pedal pulses. No carotid bruits.  Abdomen: no tenderness, no masses palpated. No hepatosplenomegaly. Bowel sounds positive.  Musculoskeletal: no clubbing / cyanosis. No joint deformity upper and lower extremities. Good ROM, no contractures. Normal muscle tone.  Skin: no rashes, lesions, ulcers. No induration Neurologic: CN 2-12 grossly intact. Strength 4/5 in all extremities Psychiatric: Normal judgment and insight. Alert and oriented x 3. Normal mood.     Labs on Admission: I have personally reviewed following labs and imaging studies  CBC: Recent Labs  Lab 10/25/18 0500  WBC 9.1   NEUTROABS 7.6  HGB 13.8  HCT 42.4  MCV 91.2  PLT [id number]*   Basic Metabolic Panel: Recent Labs  Lab 10/25/18 0500  NA 138  K 3.4*  CL 108  CO2 21*  GLUCOSE 96  BUN 11  CREATININE 0.93  CALCIUM 9.1   GFR: Estimated Creatinine Clearance: 100.8 mL/min (by C-G formula based on SCr of 0.93 mg/dL). Liver Function Tests: No results for input(s): AST, ALT, ALKPHOS, BILITOT, PROT, ALBUMIN in the last 168 hours. No results for input(s): LIPASE, AMYLASE in the last 168 hours. No results for input(s): AMMONIA in the last 168 hours. Coagulation Profile: No results for input(s): INR, PROTIME in the last 168 hours. Cardiac Enzymes: No results for input(s): CKTOTAL, CKMB, CKMBINDEX, TROPONINI in the last 168 hours. BNP (last 3 results) No results for input(s): PROBNP in the last 8760 hours. HbA1C: No results for input(s): HGBA1C in the last 72 hours. CBG: No results for input(s): GLUCAP in the last 168 hours. Lipid Profile: No results for input(s): CHOL, HDL, LDLCALC, TRIG, CHOLHDL, LDLDIRECT in the last 72 hours. Thyroid Function Tests: No results for input(s): TSH, T4TOTAL, FREET4, T3FREE, THYROIDAB in the last 72 hours. Anemia Panel: No results for input(s): VITAMINB12, FOLATE, FERRITIN, TIBC, IRON, RETICCTPCT in the last 72 hours. Urine analysis:    Component Value Date/Time   COLORURINE YELLOW 02/03/2016 1334   APPEARANCEUR CLEAR 02/03/2016 1334   LABSPEC 1.014 02/03/2016 1334   PHURINE 5.0 02/03/2016 1334   GLUCOSEU NEGATIVE 02/03/2016 1334   HGBUR NEGATIVE 02/03/2016 1334   BILIRUBINUR NEGATIVE 02/03/2016 1334   KETONESUR NEGATIVE 02/03/2016 1334   PROTEINUR NEGATIVE 02/03/2016 1334   UROBILINOGEN 0.2 09/07/2015 0951   NITRITE NEGATIVE 02/03/2016 1334   LEUKOCYTESUR NEGATIVE 02/03/2016 1334   Sepsis Labs: No results found for this or any previous visit (from the past 240 hour(s)).   Radiological Exams on Admission: No results found.   Assessment/Plan  Question  multiple sclerosis flare: Patient presents with several nonspecific complaints she reports being related possibly to a MS flare.  She is currently taking Fingolimod and followed by Dr. Delice Lesch in the outpatient setting.  Transfer[facility] for need of MRI under general anesthesia.  Question if symptoms secondary to polypharmacy. -Admit to a medical telemetry bed -Check MRI of the brain and cervical spine with and without contrast -Bladder scan every shift.  Will place Foley catheter for signs of urinary retention. -PT/OT to eval and treat -Continue Fingolimod -Consulted Dr. Marylou Mccoy of anesthesiologyfor assistance with obtaining MRI.  Case posted for tomorrow at around 4:30 PM patient to be n.p.o. after midnight due to full schedule today.  -Discussed case with Dr. Lorraine Lax who recommends adding MRI of the thoracic spine as well -Formally consult neurology if MRI noted to be abnormal  Hypokalemia: Acute on chronic.  Potassium noted to be 3.4 on admission.  Question of subtle -Give 40 mEq of potassium chloride x1 dose now -Continue to monitor and replace as  Anxiety, depression, PTSD, pseudoseizures, and conversion disorder: Patient with significant past psychiatric medical history.  Reports inability to be in close spaces due to pseudoseizures. -Check lithium and Depakote levels -Continue lithium, Depakote, Prozac, Haldol, Valium prn anxiety, BuSpar  Migraine headaches: Patient on Aimovig injections every 30 days and Topamax -Continue home regimen  Chronic Pain -Continue Lyrica  ADHD: Patient home medications include Strattera. -Resume Strattera at discharge.  Essential hypertension: Elevated up to 150/107. -Continue amlodipine -Labetalol IV as needed  Thrombocytosis: Acute. Platelet count mildly elevated at 435. -Continue to monitor  Morbid obesity: BMI 46.68 kg/m  DVT prophylaxis: lovenox Code Status: Full Family Communication: No family present at  bedside Disposition Plan: Possible discharge home in 1 to 2 days Consults called: None Admission status: Observation  Norval Morton MD [facility] Pager [phone]   If 7PM-7AM, please contact night-coverage [email or web address] Password [facility]  10/25/2018, 8:34 AM

## 2018-10-25 NOTE — Care Management (Signed)
This is a no charge note  Pending admission per PA, Emily [name]  39 year old lady with past medical history of multiple sclerosis, chronic pain, hypertension, asthma, GERD, depression, anxiety, PTSD, panic attack, ADHD, pseudoseizure, perforated bowel, who presents with worsening shooting pain and bilateral leg weakness. Pt was initially seen in [facility]. Tele neurologist recommended MRI of brain and MRI of C-spine. Pt equires anesthesia for MRI's due pseudoseizure. Pt is placed in med-surg bed for obs. Pending CBC and BMP.  Vital signs are stable.  Per ED physician, patient has care plan, she did not receive any narcotics.   Ivor Costa, MD  [facility]   If 7PM-7AM, please contact night-coverage [email or web address] Password [email or web address] 10/25/2018, 4:59 AM

## 2018-10-25 NOTE — ED Provider Notes (Signed)
Patient transferred here from [facility] for MRI of brain and cervical spine with and without contrast.  She has history of MS and apparently has had some tingling, smothering, pin-like sensations of her body as well as reported inability to use her legs.  She was observed to be using the bedside commode multiple times in the ER at any pain without issue.  She continues requesting pain medication and "ketamine".  Has active care plan and is apparently in pain management.  Plan:  MRI's pending as per Tele Neurology recommendations (see their separate note).   4:20 AM Patient called for MRI however is refusing.  States she requires anesthesia for MRI's which is confirmed on chart review.  States she cannot tolerate them and has "seizures" due to her conversion disorder making scans essentially useless.  She reports she told the staff at [facility] this multiple times, however they sent her here anyway.  At this point, anesthesia not available for elective MRI.  She likely will require hospital admission to have this done during the day.  Patient requesting ketamine here because it "helps her relax".  She was again informed of her care plan and she would not be receiving narcotics or other controlled substances here.  Discussed with Dr. Blaine Hamper-- will admit for ongoing care.   Larene Pickett, PA-C 10/25/18 0507    Ward, Delice Bison, DO 10/25/18 0530

## 2018-10-25 NOTE — ED Notes (Signed)
Pt transferred from bed to commode and back to bed without assistance. While using the commode Pt made multiple requests for "Special K..* No surgery found * Ketamine", "tranquilizer or sedative" to help her get more comfortable. MD made aware. Pt informed we have a 'Care Plan' established for her and Pt was reminded she would not receive any pain medication.

## 2018-10-26 ENCOUNTER — Inpatient Hospital Stay (HOSPITAL_COMMUNITY): Payer: Medicare Other

## 2018-10-26 ENCOUNTER — Inpatient Hospital Stay (HOSPITAL_COMMUNITY): Payer: Medicare Other | Admitting: Certified Registered"

## 2018-10-26 ENCOUNTER — Encounter (HOSPITAL_COMMUNITY)
Admission: EM | Disposition: A | Discharge status: Home / Self Care | Payer: Self-pay | Source: Home / Self Care | Attending: Internal Medicine

## 2018-10-26 ENCOUNTER — Encounter (HOSPITAL_COMMUNITY): Payer: Self-pay | Admitting: Orthopedic Surgery

## 2018-10-26 ENCOUNTER — Telehealth: Payer: Self-pay | Admitting: Neurology

## 2018-10-26 DIAGNOSIS — R531 Weakness: Secondary | ICD-10-CM | POA: Diagnosis not present

## 2018-10-26 DIAGNOSIS — R070 Pain in throat: Secondary | ICD-10-CM

## 2018-10-26 DIAGNOSIS — M47816 Spondylosis without myelopathy or radiculopathy, lumbar region: Secondary | ICD-10-CM | POA: Diagnosis present

## 2018-10-26 DIAGNOSIS — E876 Hypokalemia: Secondary | ICD-10-CM | POA: Diagnosis not present

## 2018-10-26 DIAGNOSIS — Z6841 Body Mass Index (BMI) 40.0 and over, adult: Secondary | ICD-10-CM | POA: Diagnosis not present

## 2018-10-26 DIAGNOSIS — M5134 Other intervertebral disc degeneration, thoracic region: Secondary | ICD-10-CM | POA: Diagnosis not present

## 2018-10-26 DIAGNOSIS — M47817 Spondylosis without myelopathy or radiculopathy, lumbosacral region: Secondary | ICD-10-CM | POA: Diagnosis present

## 2018-10-26 DIAGNOSIS — G35 Multiple sclerosis: Secondary | ICD-10-CM | POA: Diagnosis not present

## 2018-10-26 DIAGNOSIS — J45909 Unspecified asthma, uncomplicated: Secondary | ICD-10-CM | POA: Diagnosis present

## 2018-10-26 DIAGNOSIS — G5603 Carpal tunnel syndrome, bilateral upper limbs: Secondary | ICD-10-CM | POA: Diagnosis present

## 2018-10-26 DIAGNOSIS — Z20828 Contact with and (suspected) exposure to other viral communicable diseases: Secondary | ICD-10-CM | POA: Diagnosis not present

## 2018-10-26 DIAGNOSIS — F445 Conversion disorder with seizures or convulsions: Secondary | ICD-10-CM | POA: Diagnosis not present

## 2018-10-26 DIAGNOSIS — G43909 Migraine, unspecified, not intractable, without status migrainosus: Secondary | ICD-10-CM | POA: Diagnosis not present

## 2018-10-26 DIAGNOSIS — M5136 Other intervertebral disc degeneration, lumbar region: Secondary | ICD-10-CM | POA: Diagnosis not present

## 2018-10-26 DIAGNOSIS — F339 Major depressive disorder, recurrent, unspecified: Secondary | ICD-10-CM | POA: Diagnosis not present

## 2018-10-26 DIAGNOSIS — M50323 Other cervical disc degeneration at C6-C7 level: Secondary | ICD-10-CM | POA: Diagnosis not present

## 2018-10-26 DIAGNOSIS — M170 Bilateral primary osteoarthritis of knee: Secondary | ICD-10-CM | POA: Diagnosis present

## 2018-10-26 DIAGNOSIS — K219 Gastro-esophageal reflux disease without esophagitis: Secondary | ICD-10-CM | POA: Diagnosis not present

## 2018-10-26 DIAGNOSIS — M47896 Other spondylosis, lumbar region: Secondary | ICD-10-CM | POA: Diagnosis present

## 2018-10-26 DIAGNOSIS — F909 Attention-deficit hyperactivity disorder, unspecified type: Secondary | ICD-10-CM | POA: Diagnosis not present

## 2018-10-26 DIAGNOSIS — M50221 Other cervical disc displacement at C4-C5 level: Secondary | ICD-10-CM | POA: Diagnosis not present

## 2018-10-26 DIAGNOSIS — M50322 Other cervical disc degeneration at C5-C6 level: Secondary | ICD-10-CM | POA: Diagnosis not present

## 2018-10-26 DIAGNOSIS — M4802 Spinal stenosis, cervical region: Secondary | ICD-10-CM | POA: Diagnosis not present

## 2018-10-26 DIAGNOSIS — G8929 Other chronic pain: Secondary | ICD-10-CM | POA: Diagnosis not present

## 2018-10-26 DIAGNOSIS — M5030 Other cervical disc degeneration, unspecified cervical region: Secondary | ICD-10-CM | POA: Diagnosis not present

## 2018-10-26 DIAGNOSIS — G629 Polyneuropathy, unspecified: Secondary | ICD-10-CM | POA: Diagnosis not present

## 2018-10-26 DIAGNOSIS — D473 Essential (hemorrhagic) thrombocythemia: Secondary | ICD-10-CM | POA: Diagnosis present

## 2018-10-26 DIAGNOSIS — M5125 Other intervertebral disc displacement, thoracolumbar region: Secondary | ICD-10-CM | POA: Diagnosis not present

## 2018-10-26 DIAGNOSIS — M47812 Spondylosis without myelopathy or radiculopathy, cervical region: Secondary | ICD-10-CM | POA: Diagnosis present

## 2018-10-26 DIAGNOSIS — J029 Acute pharyngitis, unspecified: Secondary | ICD-10-CM

## 2018-10-26 DIAGNOSIS — I10 Essential (primary) hypertension: Secondary | ICD-10-CM | POA: Diagnosis not present

## 2018-10-26 DIAGNOSIS — K760 Fatty (change of) liver, not elsewhere classified: Secondary | ICD-10-CM | POA: Diagnosis not present

## 2018-10-26 HISTORY — PX: RADIOLOGY WITH ANESTHESIA: SHX6223

## 2018-10-26 LAB — CBC
HCT: 42.3 % (ref 36.0–46.0)
Hemoglobin: 13.8 g/dL (ref 12.0–15.0)
MCH: 29.4 pg (ref 26.0–34.0)
MCHC: 32.6 g/dL (ref 30.0–36.0)
MCV: 90.2 fL (ref 80.0–100.0)
Platelets: 374 10*3/uL (ref 150–400)
RBC: 4.69 MIL/uL (ref 3.87–5.11)
RDW: 12.9 % (ref 11.5–15.5)
WBC: 5.9 10*3/uL (ref 4.0–10.5)
nRBC: 0 % (ref 0.0–0.2)

## 2018-10-26 LAB — VITAMIN B12: Vitamin B-12: 274 pg/mL (ref 180–914)

## 2018-10-26 LAB — BASIC METABOLIC PANEL
Anion gap: 10 (ref 5–15)
BUN: 11 mg/dL (ref 6–20)
CO2: 21 mmol/L — ABNORMAL LOW (ref 22–32)
Calcium: 8.8 mg/dL — ABNORMAL LOW (ref 8.9–10.3)
Chloride: 110 mmol/L (ref 98–111)
Creatinine, Ser: 0.97 mg/dL (ref 0.44–1.00)
GFR calc Af Amer: >60 mL/min (ref >60)
GFR calc non Af Amer: >60 mL/min (ref >60)
Glucose, Bld: 91 mg/dL (ref 70–99)
Potassium: 3.8 mmol/L (ref 3.5–5.1)
Sodium: 141 mmol/L (ref 135–145)

## 2018-10-26 SURGERY — MRI WITH ANESTHESIA
Anesthesia: General

## 2018-10-26 MED ORDER — PROMETHAZINE HCL 25 MG/ML IJ SOLN: 6.2500 mg | INTRAMUSCULAR | Status: DC | PRN

## 2018-10-26 MED ORDER — ONDANSETRON HCL 4 MG/2ML IJ SOLN
INTRAMUSCULAR | Status: AC
  Filled 2018-10-26: qty 2

## 2018-10-26 MED ORDER — LACTATED RINGERS IV SOLN: INTRAVENOUS | Status: DC

## 2018-10-26 MED ORDER — FENTANYL CITRATE (PF) 100 MCG/2ML IJ SOLN: 25.0000 ug | INTRAMUSCULAR | Status: DC | PRN

## 2018-10-26 MED ORDER — GADOBUTROL 1 MMOL/ML IV SOLN
10.0000 mL | Freq: Once | INTRAVENOUS | Status: AC | PRN
  Administered 2018-10-26: 10 mL via INTRAVENOUS

## 2018-10-26 MED ORDER — MIDAZOLAM HCL 2 MG/2ML IJ SOLN
INTRAMUSCULAR | Status: AC
  Filled 2018-10-26: qty 2

## 2018-10-26 MED ORDER — SODIUM CHLORIDE 0.9 % IV SOLN
INTRAVENOUS | Status: DC
  Administered 2018-10-26: 11:00:00 via INTRAVENOUS

## 2018-10-26 MED ORDER — MEPERIDINE HCL 25 MG/ML IJ SOLN: 6.2500 mg | INTRAMUSCULAR | Status: DC | PRN

## 2018-10-26 MED ORDER — EPHEDRINE 5 MG/ML INJ
INTRAVENOUS | Status: AC
  Filled 2018-10-26: qty 10

## 2018-10-26 MED ORDER — LABETALOL HCL 5 MG/ML IV SOLN
5.0000 mg | INTRAVENOUS | Status: DC | PRN
  Administered 2018-10-26: 5 mg via INTRAVENOUS
  Filled 2018-10-26: qty 4

## 2018-10-26 MED ORDER — PROPOFOL 10 MG/ML IV BOLUS
INTRAVENOUS | Status: AC
  Filled 2018-10-26: qty 20

## 2018-10-26 MED ORDER — PHENYLEPHRINE 40 MCG/ML (10ML) SYRINGE FOR IV PUSH (FOR BLOOD PRESSURE SUPPORT)
PREFILLED_SYRINGE | INTRAVENOUS | Status: AC
  Filled 2018-10-26: qty 10

## 2018-10-26 MED ORDER — SUCCINYLCHOLINE CHLORIDE 200 MG/10ML IV SOSY
PREFILLED_SYRINGE | INTRAVENOUS | Status: AC
  Filled 2018-10-26: qty 10

## 2018-10-26 MED ORDER — DEXAMETHASONE SODIUM PHOSPHATE 10 MG/ML IJ SOLN
INTRAMUSCULAR | Status: AC
  Filled 2018-10-26: qty 2

## 2018-10-26 MED ORDER — LIDOCAINE 2% (20 MG/ML) 5 ML SYRINGE
INTRAMUSCULAR | Status: AC
  Filled 2018-10-26: qty 5

## 2018-10-26 MED ORDER — ROCURONIUM BROMIDE 10 MG/ML (PF) SYRINGE
PREFILLED_SYRINGE | INTRAVENOUS | Status: AC
  Filled 2018-10-26: qty 10

## 2018-10-26 MED ORDER — FENTANYL CITRATE (PF) 250 MCG/5ML IJ SOLN
INTRAMUSCULAR | Status: AC
  Filled 2018-10-26: qty 5

## 2018-10-26 NOTE — Evaluation (Signed)
Occupational Therapy Evaluation Patient Details Name: Emily [name] MRN: [id number] DOB: 1980/06/13 Today's Date: 10/26/2018    History of Present Illness Pt is a 38 year old woman admitted with multiple complaints including burning pain in her upper and lower extremities. PMH: MS, chronic pain, HTN, asthma, depression, anxiety, PTSD.    Clinical Impression   Pt is functioning modified independently (at her baseline) in ADL and ADL transfers. No OT needs.    Follow Up Recommendations  No OT follow up    Equipment Recommendations  None recommended by OT    Recommendations for Other Services       Precautions / Restrictions Precautions Precautions: Fall Precaution Comments: falls due to pseudo seizures Restrictions Weight Bearing Restrictions: No      Mobility Bed Mobility Overal bed mobility: Independent                Transfers Overall transfer level: Modified independent Equipment used: Straight cane                  Balance Overall balance assessment: Modified Independent                                         ADL either performed or assessed with clinical judgement   ADL Overall ADL's : Modified independent                                             Vision Baseline Vision/History: No visual deficits Patient Visual Report: No change from baseline       Perception     Praxis      Pertinent Vitals/Pain Pain Assessment: 0-10 Pain Score: 8  Pain Location: legs Pain Descriptors / Indicators: Aching Pain Intervention(s): Monitored during session;Repositioned     Hand Dominance Right   Extremity/Trunk Assessment Upper Extremity Assessment Upper Extremity Assessment: Overall WFL for tasks assessed   Lower Extremity Assessment Lower Extremity Assessment: Defer to PT evaluation   Cervical / Trunk Assessment Cervical / Trunk Assessment: Normal   Communication Communication Communication: No  difficulties   Cognition Arousal/Alertness: Awake/alert Behavior During Therapy: Anxious(improved with distraction) Overall Cognitive Status: History of cognitive impairments - at baseline                                 General Comments: baseline memory deficits   General Comments       Exercises     Shoulder Instructions      Home Living Family/patient expects to be discharged to:: Private residence Living Arrangements: Children;Parent Available Help at Discharge: Family;Available 24 hours/day Type of Home: House       Home Layout: Two level(lives in the basement of her parents home) Alternate Level Stairs-Number of Steps: flight Alternate Level Stairs-Rails: Right Bathroom Shower/Tub: Teacher, early years/pre: Standard     Home Equipment: Cane - single point          Prior Functioning/Environment Level of Independence: Independent with assistive device(s)                 OT Problem List:        OT Treatment/Interventions:      OT Goals(Current goals can be found in  the care plan section) Acute Rehab OT Goals Patient Stated Goal: go home to her daughter  OT Frequency:     Barriers to D/C:            Co-evaluation              AM-PAC OT "6 Clicks" Daily Activity     Outcome Measure Help from another person eating meals?: None Help from another person taking care of personal grooming?: None Help from another person toileting, which includes using toliet, bedpan, or urinal?: None Help from another person bathing (including washing, rinsing, drying)?: None Help from another person to put on and taking off regular upper body clothing?: None Help from another person to put on and taking off regular lower body clothing?: None 6 Click Score: 24   End of Session    Activity Tolerance: Patient tolerated treatment well Patient left: in bed;with call bell/phone within reach  OT Visit Diagnosis: Other abnormalities of gait  and mobility (R26.89);Pain                Time: [id number] OT Time Calculation (min): 20 min Charges:  OT General Charges $OT Visit: 1 Visit OT Evaluation $OT Eval Moderate Complexity: 1 Mod  Emily [name], OTR/L Acute Rehabilitation Services Pager: [phone] Office: [phone]  Malka So 10/26/2018, 10:36 AM

## 2018-10-26 NOTE — Evaluation (Signed)
Physical Therapy Evaluation Patient Details Name: Emily [name] MRN: [id number] DOB: [birth year] Today's Date: 10/26/2018   History of Present Illness  Pt is a 38 year old woman admitted with multiple complaints including burning pain in her upper and lower extremities. PMH: MS, chronic pain, HTN, asthma, depression, anxiety, PTSD.   Clinical Impression  Patient received sitting up in bed. Reports she is freaking out. She has planned MRI today that she is anxious about. Patient able to calm during evaluation when discussing her daughter and showing pictures and videos. Patient performed bed mobility without assistance or difficulty. Ambulated with SPC 50 feet with supervision. She will benefit from continued PT while here to ensure she will be safe upon return home. Stair training to be completed prior to discharge.        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  Stairs            Wheelchair Mobility    Modified Rankin (Stroke Patients Only)       Balance Overall balance assessment: Modified Independent                                           Pertinent Vitals/Pain Pain Assessment: 0-10 Pain Score:  8  Pain Location: legs Pain Descriptors / Indicators: Aching;Burning Pain Intervention(s): Monitored during session    Home Living Family/patient expects to be discharged to:: Private residence Living Arrangements: Parent;Children Available Help at Discharge: Family;Available 24 hours/day Type of Home: House Home Access: Stairs to enter   [employer] of Steps: a couple Home Layout: Two level Home Equipment: Cane - single point      Prior Function Level of Independence: Independent with assistive device(s)               Hand Dominance   Dominant Hand: Right    Extremity/Trunk Assessment   Upper Extremity Assessment Upper Extremity Assessment: Defer to OT evaluation    Lower Extremity Assessment Lower Extremity Assessment: Overall WFL for tasks assessed    Cervical / Trunk Assessment Cervical / Trunk Assessment: Normal  Communication   Communication: No difficulties  Cognition Arousal/Alertness: Awake/alert Behavior During Therapy: Anxious Overall Cognitive Status: History of cognitive impairments - at baseline                                 General Comments: baseline memory deficits      General Comments      Exercises     Assessment/Plan    PT Assessment Patient needs continued PT services  PT Problem List  Decreased strength;Decreased mobility;Decreased activity tolerance;Pain       PT Treatment Interventions Therapeutic activities;Gait training;Therapeutic exercise;Stair training;Functional mobility training;Patient/family education    PT Goals (Current goals can be found in the Care Plan section)  Acute Rehab PT Goals Patient Stated Goal: to go home PT Goal Formulation: With patient Time For Goal Achievement: 11/02/18 Potential to Achieve Goals: Good    Frequency Min 2X/week   Barriers to discharge        Co-evaluation PT/OT/SLP Co-Evaluation/Treatment: Yes Reason for Co-Treatment: For patient/therapist  safety;To address functional/ADL transfers PT goals addressed during session: Mobility/safety with mobility;Balance         AM-PAC PT "6 Clicks" Mobility  Outcome Measure Help needed turning from your back to your side while in a flat bed without using bedrails?: None Help needed moving from lying on your back to sitting on the side of a flat bed without using bedrails?: None Help needed moving to and from a bed to a chair (including a wheelchair)?: A Little Help needed standing up from a chair using your arms (e.g., wheelchair or bedside chair)?: None Help needed to walk in hospital room?: A Little Help needed climbing 3-5 steps with a railing? : A Little 6 Click Score: 21    End of Session   Activity Tolerance: Patient tolerated treatment well Patient left: in bed;with call bell/phone within reach Nurse Communication: Mobility status PT Visit Diagnosis: Muscle weakness (generalized) (M62.81);Pain Pain - Right/Left: (B LEs)    Time: [id number] PT Time Calculation (min) (ACUTE ONLY): 19 min   Charges:   PT Evaluation $PT Eval Moderate Complexity: 1 Mod PT Treatments $Gait Training: 8-22 mins        Kiasha Bellin, PT, GCS 10/26/18,10:47 AM

## 2018-10-26 NOTE — Transfer of Care (Signed)
Immediate Anesthesia Transfer of Care Note  Patient: Emily [name]  Procedure(s) Performed: MRI UNDER ANESTHESIA; HEAD, CERVICAL AND THORASCIC SPINE (N/A )  Patient Location: PACU  Anesthesia Type:General  Level of Consciousness: awake, alert , oriented and patient cooperative  Airway & Oxygen Therapy: Patient Spontanous Breathing and Patient connected to nasal cannula oxygen  Post-op Assessment: Report given to RN and Post -op Vital signs reviewed and stable  Post vital signs: Reviewed and stable  Last Vitals:  Vitals Value Taken Time  BP 146/92 10/26/18 1937  Temp    Pulse 87 10/26/18 1942  Resp 16 10/26/18 1942  SpO2 98 % 10/26/18 1942  Vitals shown include unvalidated device data.  Last Pain:  Vitals:   10/26/18 1452  TempSrc: Oral  PainSc:          Complications: No apparent anesthesia complications

## 2018-10-26 NOTE — Plan of Care (Signed)

## 2018-10-26 NOTE — Telephone Encounter (Signed)
Patient left msg with after hours about being taken to [place]. No neuro on staff there. MS related problems- having tingling, vertigo, trouble walking, skin feel like on fire.

## 2018-10-26 NOTE — Anesthesia Procedure Notes (Signed)
Procedure Name: Intubation Date/Time: 10/26/2018 4:54 PM Performed by: Shirlyn Goltz, CRNA Pre-anesthesia Checklist: Patient identified, Emergency Drugs available, Suction available and Patient being monitored Patient Re-evaluated:Patient Re-evaluated prior to induction Oxygen Delivery Method: Circle system utilized Preoxygenation: Pre-oxygenation with 100% oxygen Induction Type: IV induction and Rapid sequence Laryngoscope Size: Mac and 4 Grade View: Grade I Tube type: Oral Tube size: 7.0 mm Number of attempts: 1 Airway Equipment and Method: Stylet Placement Confirmation: ETT inserted through vocal cords under direct vision,  positive ETCO2 and breath sounds checked- equal and bilateral Secured at: 21 cm Tube secured with: Tape Dental Injury: Teeth and Oropharynx as per pre-operative assessment

## 2018-10-26 NOTE — Anesthesia Preprocedure Evaluation (Addendum)
Anesthesia Evaluation  Patient identified by MRN, date of birth, ID band Patient awake    Reviewed: Allergy & Precautions, NPO status , Patient's Chart, lab work & pertinent test results  History of Anesthesia Complications Negative for: history of anesthetic complications  Airway Mallampati: II       Dental no notable dental hx. (+) Teeth Intact   Pulmonary asthma (childhood) , former smoker,    Pulmonary exam normal breath sounds clear to auscultation       Cardiovascular hypertension, Pt. on medications Normal cardiovascular exam Rhythm:Regular Rate:Normal     Neuro/Psych  Headaches, PSYCHIATRIC DISORDERS Anxiety Depression  Conversion d/o  Pseudoseizures Multiple sclerosis Vertigo   Neuromuscular disease    GI/Hepatic Neg liver ROS, GERD  Medicated,  Endo/Other  Morbid obesity  Renal/GU negative Renal ROS  negative genitourinary   Musculoskeletal  (+) Arthritis ,  Chronic pain    Abdominal (+) + obese,   Peds  (+) ADHD Hematology negative hematology ROS (+)   Anesthesia Other Findings   Reproductive/Obstetrics                                                             Anesthesia Evaluation  Patient identified by MRN, date of birth, ID band Patient awake    Reviewed: Allergy & Precautions, NPO status , Patient's Chart, lab work & pertinent test results  Airway Mallampati: II  TM Distance: >3 FB Neck ROM: Full    Dental  (+) Loose, Poor Dentition   Pulmonary asthma , former smoker,    Pulmonary exam normal breath sounds clear to auscultation       Cardiovascular hypertension, Pt. 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 arthritis   Abdominal (+) + obese,   Peds  Hematology negative hematology ROS (+)   Anesthesia Other Findings   Reproductive/Obstetrics                             Anesthesia Physical  Anesthesia Plan  ASA: III  Anesthesia Plan: General   Post-op Pain Management:    Induction: Intravenous  PONV Risk Score and Plan: 3 and Midazolam, Ondansetron and Treatment may vary due to age or medical condition  Airway Management Planned: LMA  Additional Equipment:   Intra-op Plan:   Post-operative Plan: Extubation in OR  Informed Consent: I have reviewed the patients History and Physical, chart, labs and discussed the procedure including the risks, benefits and alternatives for the proposed anesthesia with the patient or authorized representative who has indicated his/her understanding and acceptance.     Dental advisory given  Plan Discussed with: CRNA and Surgeon  Anesthesia Plan Comments: (   )        Anesthesia Quick Evaluation  Anesthesia Evaluation  Patient identified by MRN, date of birth, ID band Patient awake    Reviewed: Allergy & Precautions, NPO status , Patient's Chart, lab work & pertinent test results  Airway Mallampati: II  TM Distance: >3 FB Neck ROM: Full    Dental  (+) Loose, Poor Dentition   Pulmonary asthma , former smoker,    Pulmonary exam normal breath sounds clear to auscultation       Cardiovascular hypertension, Pt. 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  Neuromuscular disease    GI/Hepatic GERD  Medicated and Controlled,(+)     substance abuse  ,   Endo/Other  Morbid obesity  Renal/GU negative Renal ROS  negative genitourinary   Musculoskeletal  (+) Arthritis , Osteoarthritis,  narcotic dependentDDD Cervical, Thoracic and Lumbar spine Spondylolysis Lumbar spine Facet arthritis   Abdominal (+) + obese,   Peds  Hematology negative hematology ROS (+)   Anesthesia Other Findings   Reproductive/Obstetrics                           Anesthesia Physical Anesthesia Plan  ASA: III  Anesthesia Plan: General   Post-op Pain Management:    Induction: Intravenous  PONV Risk Score and Plan: 3 and Midazolam, Ondansetron and Treatment may vary due to age or medical condition  Airway Management Planned: LMA  Additional Equipment:   Intra-op Plan:   Post-operative Plan: Extubation in OR  Informed Consent: I have reviewed the patients History and Physical, chart, labs and discussed the procedure including the risks, benefits and alternatives for the proposed anesthesia with the patient or authorized representative who has indicated his/her understanding and acceptance.     Dental advisory given  Plan Discussed with: CRNA and Surgeon  Anesthesia Plan Comments: (PAT note written by Myra Gianotti, PA-C. SAME DAY WORK-UP   )       Anesthesia Quick Evaluation  Anesthesia Physical Anesthesia Plan  ASA: III  Anesthesia Plan: General   Post-op Pain Management:    Induction: Intravenous  PONV Risk Score and Plan: 3 and Treatment may vary due to age or medical condition, Ondansetron and Dexamethasone  Airway Management Planned: Oral ETT  Additional Equipment: None  Intra-op Plan:   Post-operative Plan: Extubation in OR  Informed Consent: I have reviewed the  patients History and Physical, chart, labs and discussed the procedure including the risks, benefits and alternatives for the proposed anesthesia with the patient or authorized representative who has indicated his/her understanding and acceptance.     Dental advisory given  Plan Discussed with: CRNA  Anesthesia Plan Comments:        Anesthesia Quick Evaluation

## 2018-10-26 NOTE — Progress Notes (Signed)
[facility] PROGRESS NOTE  Danija L Regnier [id number] DOB: 02-06-1980 DOA: 10/24/2018 PCP: Monico Blitz, MD  Assessment/Plan:  Multiple sclerosis/possible flare: Patient presented with several nonspecific complaints she reported being related possibly to a MS flare. States that this am symptoms better but still "bothersome". Transferred from [facility] to [facility] for need of MRI under general anesthesia.  Question if symptoms secondary to polypharmacy. -Awaiting MRI of the brain and cervical spine and t-spine with and without contrast, Anesthesia dept scheduled for 2pm today.  -Bladder scan every shift.  Will place Foley catheter for signs of urinary retention. -PT/OT to eval and treat -Continue Fingolimod -Discussed case with Dr. Lorraine Lax who recommends adding MRI of the thoracic spine as well -Formally consult neurology if MRI noted to be abnormal  Hypokalemia: Acute on chronic.  Potassium noted to be 3.4 on admission and 3.8 this am after 1 time dose potassium.  -Continue to monitor and replace as needed  Anxiety, depression, PTSD, pseudoseizures, and conversion disorder: Patient with significant past psychiatric medical history.  Reported inability to be in close spaces due to pseudoseizures. Lithium level 0.30 and valporic level <10. Reports compliance Depakote levels -Continue lithium, Depakote, Prozac, Haldol, Valium prn anxiety, BuSpar -monitor levels  Migraine headaches: Patient on Aimovig injections every 30 days and Topamax. Denies headache -Continue home regimen  Chronic Pain -Continue Lyrica  ADHD: Patient home medications include Strattera. -Resume Strattera at discharge.  Essential hypertension: Elevated up to 150/107. Reports the pain makes BP difficult to control and "1 dose of pain med will fix that" -Continue amlodipine -Labetalol IV as needed  Thrombocytosis: Acute. Platelet count mildly elevated at 435. -Continue to monitor  Morbid  obesity: BMI 46.68 kg/m  Code Status: full Family Communication: pateint Disposition Plan: home hopefully tomorrow   Consultants:    Procedures:    Antibiotics:    HPI/Subjective: Sitting up in bed. Reports improvement of symptoms.   Objective: Vitals:   10/26/18 0405 10/26/18 0757  BP: (!) 128/100 (!) 140/100  Pulse: 86 93  Resp: 18 16  Temp: 97.7 F (36.5 C) 98.4 F (36.9 C)  SpO2: 100% 96%    Intake/Output Summary (Last 24 hours) at 10/26/2018 [id number] Last data filed at 10/25/2018 2100 Gross per 24 hour  Intake 480 ml  Output -  Net 480 ml   Filed Weights   10/24/18 1552  Weight: 120 kg    Exam:   General:  Obese alert no acute distress  Cardiovascular: rrr no mgr no LE edema  Respiratory: normal effort BS clear but distant  Abdomen: obese soft +BS no guarding or rebounding  Musculoskeletal: joints without swelling/erythema, moves all extremeties   Data Reviewed: Basic Metabolic Panel: Recent Labs  Lab 10/25/18 0500 10/26/18 0608  NA 138 141  K 3.4* 3.8  CL 108 110  CO2 21* 21*  GLUCOSE 96 91  BUN 11 11  CREATININE 0.93 0.97  CALCIUM 9.1 8.8*   Liver Function Tests: No results for input(s): AST, ALT, ALKPHOS, BILITOT, PROT, ALBUMIN in the last 168 hours. No results for input(s): LIPASE, AMYLASE in the last 168 hours. No results for input(s): AMMONIA in the last 168 hours. CBC: Recent Labs  Lab 10/25/18 0500 10/26/18 0608  WBC 9.1 5.9  NEUTROABS 7.6  --   HGB 13.8 13.8  HCT 42.4 42.3  MCV 91.2 90.2  PLT 435* 374   Cardiac Enzymes: No results for input(s): CKTOTAL, CKMB, CKMBINDEX, TROPONINI in the last 168 hours. BNP (last  3 results) No results for input(s): BNP in the last 8760 hours.  ProBNP (last 3 results) No results for input(s): PROBNP in the last 8760 hours.  CBG: No results for input(s): GLUCAP in the last 168 hours.  Recent Results (from the past 240 hour(s))  SARS CORONAVIRUS 2 (TAT 6-24 HRS)  Nasopharyngeal Nasopharyngeal Swab     Status: None   Collection Time: 10/24/18  8:40 PM   Specimen: Nasopharyngeal Swab  Result Value Ref Range Status   SARS Coronavirus 2 NEGATIVE NEGATIVE Final    Comment: (NOTE) SARS-CoV-2 target nucleic acids are NOT DETECTED. The SARS-CoV-2 RNA is generally detectable in upper and lower respiratory specimens during the acute phase of infection. Negative results do not preclude SARS-CoV-2 infection, do not rule out co-infections with other pathogens, and should not be used as the sole basis for treatment or other patient management decisions. Negative results must be combined with clinical observations, patient history, and epidemiological information. The expected result is Negative. Fact Sheet for Patients: SugarRoll.be Fact Sheet for Healthcare Providers: https://www.woods-mathews.com/ This test is not yet approved or cleared by the [place] FDA and  has been authorized for detection and/or diagnosis of SARS-CoV-2 by FDA under an Emergency Use Authorization (EUA). This EUA will remain  in effect (meaning this test can be used) for the duration of the COVID-19 declaration under Section 56 4(b)(1) of the Act, 21 U.S.C. section 360bbb-3(b)(1), unless the authorization is terminated or revoked sooner. Performed at [facility], [place] [address] [place],  [address]      Studies: No results found.  Scheduled Meds: . amLODipine  5 mg Oral BH-q7a  . busPIRone  15 mg Oral TID  . divalproex  500 mg Oral QHS  . dronabinol  2.5 mg Oral BID AC  . enoxaparin (LOVENOX) injection  40 mg Subcutaneous Q24H  . Fingolimod HCl  0.5 mg Oral BH-q7a  . FLUoxetine  80 mg Oral BH-q7a  . haloperidol  1 mg Oral BID  . lithium carbonate  600 mg Oral BID  . pantoprazole  40 mg Oral Daily  . pregabalin  300 mg Oral QHS  . topiramate  150 mg Oral BID   Continuous Infusions: . sodium chloride       Principal Problem:   MS (multiple sclerosis) (HCC) Active Problems:   Chronic pain   Migraine   Pseudoseizures   ADHD   Anxiety   Morbid obesity with BMI of 45.0-49.9, adult (HCC)   Panic attack    Time spent: 34 minutes    [place] NP [facility] If 7PM-7AM, please contact night-coverage at [email or web address], password [facility] 10/26/2018, 9:09 AM  LOS: 0 days

## 2018-10-26 NOTE — Progress Notes (Signed)
Calm and cooperative.  Walking around in room and states that she feels good.  Encouraged patient to ambulate in halls and room.

## 2018-10-27 ENCOUNTER — Encounter (HOSPITAL_COMMUNITY): Payer: Self-pay | Admitting: Radiology

## 2018-10-27 LAB — BASIC METABOLIC PANEL
Anion gap: 10 (ref 5–15)
BUN: 8 mg/dL (ref 6–20)
CO2: 20 mmol/L — ABNORMAL LOW (ref 22–32)
Calcium: 8.7 mg/dL — ABNORMAL LOW (ref 8.9–10.3)
Chloride: 109 mmol/L (ref 98–111)
Creatinine, Ser: 0.86 mg/dL (ref 0.44–1.00)
GFR calc Af Amer: >60 mL/min (ref >60)
GFR calc non Af Amer: >60 mL/min (ref >60)
Glucose, Bld: 89 mg/dL (ref 70–99)
Potassium: 3.3 mmol/L — ABNORMAL LOW (ref 3.5–5.1)
Sodium: 139 mmol/L (ref 135–145)

## 2018-10-27 MED FILL — Phenylephrine-NaCl Pref Syr 0.4 MG/10ML-0.9% (40 MCG/ML): INTRAVENOUS | Qty: 10 | Status: AC

## 2018-10-27 MED FILL — Ondansetron HCl Inj 4 MG/2ML (2 MG/ML): INTRAMUSCULAR | Qty: 2 | Status: AC

## 2018-10-27 MED FILL — Sugammadex Sodium IV 200 MG/2ML (Base Equivalent): INTRAVENOUS | Qty: 2 | Status: AC

## 2018-10-27 MED FILL — Lidocaine HCl Local Soln Prefilled Syringe 100 MG/5ML (2%): INTRAMUSCULAR | Qty: 5 | Status: AC

## 2018-10-27 MED FILL — Rocuronium Bromide IV Soln 100 MG/10ML (10 MG/ML): INTRAVENOUS | Qty: 8 | Status: AC

## 2018-10-27 MED FILL — Lactated Ringer's Solution: INTRAVENOUS | Qty: 1000 | Status: AC

## 2018-10-27 MED FILL — Propofol IV Emul 200 MG/20ML (10 MG/ML): INTRAVENOUS | Qty: 20 | Status: AC

## 2018-10-27 MED FILL — Fentanyl Citrate Preservative Free (PF) Inj 100 MCG/2ML: INTRAMUSCULAR | Qty: 1 | Status: AC

## 2018-10-27 MED FILL — Ephedrine Sulf-NaCl Soln Pref Syr 50 MG/10ML-0.9% (5 MG/ML): INTRAVENOUS | Qty: 10 | Status: AC

## 2018-10-27 NOTE — Anesthesia Postprocedure Evaluation (Signed)
Anesthesia Post Note  Patient: Emily [name]  Procedure(s) Performed: MRI UNDER ANESTHESIA; HEAD, CERVICAL AND THORASCIC SPINE (N/A )     Patient location during evaluation: PACU Anesthesia Type: General Level of consciousness: awake and alert Pain management: pain level controlled Vital Signs Assessment: post-procedure vital signs reviewed and stable Respiratory status: spontaneous breathing, nonlabored ventilation and respiratory function stable Cardiovascular status: blood pressure returned to baseline and stable Postop Assessment: no apparent nausea or vomiting Anesthetic complications: no    Last Vitals:  Vitals:   10/26/18 2032 10/26/18 2336  BP: (!) 146/73 121/83  Pulse: 84 80  Resp: 18 15  Temp: 36.6 C (!) 36.4 C  SpO2: 100% 98%    Last Pain:  Vitals:   10/27/18 0400  TempSrc:   PainSc: Asleep                 Glorimar Stroope,W. EDMOND

## 2018-10-27 NOTE — Progress Notes (Signed)
A discharge packet was printed and provided to the patient.  The discharge was reviewed.  No changes in medications.  The patient will be discharged to home with her parents.

## 2018-10-27 NOTE — Plan of Care (Signed)

## 2018-10-27 NOTE — Discharge Summary (Signed)
Physician Discharge Summary  Emily [name] [id number] DOB: 05-13-1980 DOA: 10/24/2018  PCP: Monico Blitz, MD  Admit date: 10/24/2018 Discharge date: 10/27/2018  Admitted From: home Disposition:  home   Recommendations for Outpatient Follow-up:  1. none  Home Health:  None needed   Discharge Condition:  stable   CODE STATUS:  Full code   Consultations:  none    Discharge Diagnoses:  Principal Problem:   MS (multiple sclerosis) ([place]) Active Problems:   Essential HTN   Migraine   Morbid obesity with BMI of 45.0-49.9, adult ([place])   Pseudoseizures  PTSD   ADHD   Anxiety   Chronic pain        Brief Summary: Emily [name] is a 38 y.o. female with medical history significant of multiple sclerosis followed by [facility] neurology, chronic pain, hypertension, asthma, GERD, depression, anxiety, PTSD, ADHD, pseudoseizure, and perforated bowel. She presented with multiple complaints including burning sensation of skin, numbness of face/hands/legs, bilateral lower extremity weakness, difficulty speaking, worsening migraine headaches, and difficulty breathing, urinary urgency.  At baseline she ambulates with use of a cane.  She recently had an MRI of the brain on 10/6 by her neurologist Dr. Delice Lesch which showed no enhancement.  Patient however states that all of her symptoms started shortly thereafter.  She was started on prednisone 60 mg to be tapered down 10 mg every 2 days.  However, patient reports that her symptoms worsened after taking 2 doses of the prednisone.  Hospital Course:  Paresthesias, LE weakness  - a little better today - She was transferred from [facility] to [facility] for an MRI with anesthesia  - MRI done under anesthesia today shows no MS flare - she was ambulated with PT and worked with OT and has no home health needs - she will need to f/u with her neurologist if she has ongoing symptoms  MS, HTN, PTSD, ADHD - Her BP is stable - I have not made any any  medication changes    Discharge Exam: Vitals:   10/26/18 2336 10/27/18 0815  BP: 121/83 121/81  Pulse: 80 66  Resp: 15 16  Temp: (!) 97.5 F (36.4 C) 98.3 F (36.8 C)  SpO2: 98% 98%   Vitals:   10/26/18 2012 10/26/18 2032 10/26/18 2336 10/27/18 0815  BP: (!) 147/96 (!) 146/73 121/83 121/81  Pulse: 84 84 80 66  Resp: 19 18 15 16   Temp: 98.2 F (36.8 C) 97.9 F (36.6 C) (!) 97.5 F (36.4 C) 98.3 F (36.8 C)  TempSrc:  Oral Oral Oral  SpO2: 99% 100% 98% 98%  Weight:      Height:        General: Pt is alert, awake, not in acute distress Cardiovascular: RRR, S1/S2 +, no rubs, no gallops Respiratory: CTA bilaterally, no wheezing, no rhonchi Abdominal: Soft, NT, ND, bowel sounds + Extremities: no edema, no cyanosis   Discharge Instructions  Discharge Instructions    Diet - low sodium heart healthy   Complete by: As directed    Increase activity slowly   Complete by: As directed      Allergies as of 10/27/2018      Reactions   Baclofen Hives, Shortness Of Breath   Duloxetine Hcl Shortness Of Breath, Rash   Gabapentin Shortness Of Breath, Rash   Monosodium Glutamate Anaphylaxis   Other Shortness Of Breath, Rash, Other (See Comments)   MSG, beans (vomiting) MSG causes hives, itching, throat swelling   Alprazolam Other (See Comments)  Lethargy   Amitriptyline Other (See Comments), Hypertension   hypertension   Magnesium Salicylate Hives, Itching   Modafinil Other (See Comments)   Seizures   Rizatriptan Nausea And Vomiting, Other (See Comments)   GI upset, Projectile vomiting GI upset, Projectile vomiting   Tizanidine Hives   Other reaction(s): GI Upset (intolerance)   Vicodin [hydrocodone-acetaminophen] Hives, Nausea And Vomiting, Other (See Comments)   Projectile vomiting   Adhesive [tape] Other (See Comments)   Skin irritation Paper tape is ok   Hydrocodone-acetaminophen Hives, Nausea And Vomiting   Projectile vomiting   Ketorolac Tromethamine Nausea  Only   Toradol    Lamotrigine Rash   Nsaids Other (See Comments)   Irritate stomach    Tramadol Nausea And Vomiting   Other reaction(s): GI Upset (intolerance)      Medication List    TAKE these medications   Aimovig 140 MG/ML Soaj Generic drug: Erenumab-aooe Inject 140 mg into the muscle every 30 (thirty) days.   albuterol 108 (90 Base) MCG/ACT inhaler Commonly known as: VENTOLIN HFA Inhale 2 puffs into the lungs every 6 (six) hours as needed for wheezing or shortness of breath. What changed: when to take this   amLODipine 5 MG tablet Commonly known as: NORVASC Take 5 mg by mouth every morning.   atomoxetine 40 MG capsule Commonly known as: Strattera Take 1 capsule (40 mg total) by mouth daily.   busPIRone 15 MG tablet Commonly known as: BUSPAR Take 1 tablet (15 mg total) by mouth 3 (three) times daily.   diazepam 5 MG tablet Commonly known as: Valium Take 1 tablet (5 mg total) by mouth every 12 (twelve) hours as needed for anxiety.   divalproex 500 MG DR tablet Commonly known as: Depakote Take 1 tablet (500 mg total) by mouth at bedtime.   dronabinol 2.5 MG capsule Commonly known as: Marinol Take 1 capsule (2.5 mg total) by mouth 2 (two) times daily before lunch and supper.   EpiPen 0.3 mg/0.3 mL Soaj injection Generic drug: EPINEPHrine Inject 0.3 mg into the muscle as needed (allergic reaction). Reported on 03/28/2015   etonogestrel 68 MG Impl implant Commonly known as: NEXPLANON 1 each by Subdermal route once.   Fingolimod HCl 0.5 MG Caps Commonly known as: Gilenya Take 1 capsule (0.5 mg total) by mouth daily. What changed: when to take this   FLUoxetine 40 MG capsule Commonly known as: PROzac Take 2 capsules (80 mg total) by mouth every morning.   haloperidol 1 MG tablet Commonly known as: HALDOL Take 1 tablet (1 mg total) by mouth 2 (two) times daily.   lithium carbonate 300 MG CR tablet Commonly known as: LITHOBID Take 2 tablets (600 mg total)  by mouth 2 (two) times daily.   loratadine 10 MG tablet Commonly known as: CLARITIN Take 10 mg by mouth daily as needed for allergies.   methocarbamol 750 MG tablet Commonly known as: ROBAXIN Take 1 tablet (750 mg total) by mouth 2 (two) times daily as needed for muscle spasms.   MULTIVITAMIN ADULT PO Take 1 tablet by mouth daily.   NON FORMULARY Take 1 each by mouth at bedtime. CBD Gummy   omeprazole 20 MG capsule Commonly known as: PRILOSEC TAKE 1 CAPSULE (20 MG TOTAL) BY MOUTH 2 (TWO) TIMES DAILY BEFORE A MEAL.   pregabalin 300 MG capsule Commonly known as: Lyrica Take 1 capsule (300 mg total) by mouth at bedtime.   topiramate 100 MG tablet Commonly known as: TOPAMAX Take 1.5 tablets twice  a day What changed:   how much to take  how to take this  when to take this       Allergies  Allergen Reactions  . Baclofen Hives and Shortness Of Breath  . Duloxetine Hcl Shortness Of Breath and Rash  . Gabapentin Shortness Of Breath and Rash  . Monosodium Glutamate Anaphylaxis  . Other Shortness Of Breath, Rash and Other (See Comments)    MSG, beans (vomiting) MSG causes hives, itching, throat swelling  . Alprazolam Other (See Comments)    Lethargy  . Amitriptyline Other (See Comments) and Hypertension    hypertension  . Magnesium Salicylate Hives and Itching  . Modafinil Other (See Comments)    Seizures   . Rizatriptan Nausea And Vomiting and Other (See Comments)    GI upset, Projectile vomiting GI upset, Projectile vomiting  . Tizanidine Hives    Other reaction(s): GI Upset (intolerance)  . Vicodin [Hydrocodone-Acetaminophen] Hives, Nausea And Vomiting and Other (See Comments)    Projectile vomiting  . Adhesive [Tape] Other (See Comments)    Skin irritation Paper tape is ok  . Hydrocodone-Acetaminophen Hives and Nausea And Vomiting    Projectile vomiting  . Ketorolac Tromethamine Nausea Only    Toradol   . Lamotrigine Rash  . Nsaids Other (See Comments)     Irritate stomach   . Tramadol Nausea And Vomiting    Other reaction(s): GI Upset (intolerance)     Procedures/Studies:    Mr Jeri Cos And Wo Contrast  Result Date: 10/26/2018 CLINICAL DATA:  Initial evaluation for multiple sclerosis, new neurologic event. EXAM: MRI HEAD WITHOUT AND WITH CONTRAST MRI CERVICAL SPINE WITHOUT AND WITH CONTRAST MRI THORACIC SPINE WITHOUT AND WITH CONTRAST TECHNIQUE: Multiplanar, multiecho pulse sequences of the brain and surrounding structures, and cervical spine, to include the craniocervical junction and cervicothoracic junction, as well as the thoracic spine were obtained without and with intravenous contrast. CONTRAST:  64mL GADAVIST GADOBUTROL 1 MMOL/ML IV SOLN COMPARISON:  Recent brain MRI from 10/13/2018 as well as previous cervical spine MRI from 03/06/2017. FINDINGS: MRI HEAD FINDINGS Brain: Cerebral volume stable, and remains within normal limits for age. No abnormal foci of restricted diffusion to suggest acute or subacute infarct. Gray-white matter differentiation maintained without evidence for chronic cortical infarction. No acute intracranial hemorrhage. Single subcentimeter focus of susceptibility artifact noted within the posterior right frontal region, likely a small chronic microhemorrhage, of doubtful significance in isolation. Again seen are multiple scattered foci of T2/FLAIR hyperintensity involving the supratentorial cerebral white matter bilaterally, with involvement of the periventricular, deep, and juxta cortical white matter. No significant involvement of the brainstem or cerebellum. Overall, appearance is relatively stable from previous, without evidence for significant disease progression. No associated restricted diffusion or enhancement to suggest active demyelination. Pituitary gland suprasellar region normal. Midline structures intact. Vascular: Major intracranial vascular flow voids are maintained. Skull and upper cervical spine:  Craniocervical junction within normal limits. Bone marrow signal intensity normal. No scalp soft tissue abnormality. Sinuses/Orbits: Globes orbital soft tissues within normal limits. Mild scattered mucosal thickening noted within the ethmoidal air cells and maxillary sinuses. Paranasal sinuses are otherwise clear. Trace fluid signal intensity within the mastoid air cells bilaterally, of doubtful significance. Inner ear structures grossly normal. Other: None. MRI CERVICAL SPINE FINDINGS Alignment: Chronic straightening of the normal cervical lordosis, stable. No listhesis or subluxation. Vertebrae: Vertebral body height maintained without evidence for acute or interval fracture. Bone marrow signal intensity within normal limits. No discrete or worrisome osseous  lesions. Progressive degenerative reactive endplate changes about the C5-6 and C6-7 interspace. No other abnormal marrow edema or enhancement. Cord: Subtle patchy T2/stir signal abnormality within the cervical spinal cord at the level of C2, C3, and C4 is not significantly changed as compared to 2019. No abnormal enhancement to suggest active demyelination. No evidence for significant disease progression. Posterior Fossa, vertebral arteries, paraspinal tissues: Craniocervical junction within normal limits. Paraspinous and prevertebral soft tissues normal. Normal intravascular flow voids seen within the vertebral arteries bilaterally. Disc levels: C2-C3: Moderate right with advanced left facet hypertrophy, stable from previous. Resultant mild left C3 foraminal narrowing. No significant canal or right neural foraminal stenosis. C3-C4: Moderate bilateral facet hypertrophy. No canal or foraminal stenosis. C4-C5: Shallow central disc protrusion mildly indents the ventral thecal sac. No significant spinal stenosis or cord deformity. Foramina remain patent. Appearance is unchanged. C5-C6: Chronic intervertebral disc space narrowing with diffuse degenerative disc  osteophyte. Broad based left paracentral disc osteophyte complex flattens and effaces the ventral thecal sac, mildly flattening the left ventral cord. This appears similar to previous. Mild left C6 foraminal narrowing. No significant right foraminal encroachment. C6-C7: Chronic intervertebral disc space narrowing with diffuse circumferential disc osteophyte. Broad-based posterior component, asymmetric to the left flattens and largely effaces the ventral thecal sac. Secondary moderate spinal stenosis with flattening of the cervical spinal cord, greater on the left. No cord signal changes. Moderate bilateral C7 foraminal stenosis is stable to mildly progressed. C7-T1: Mild bilateral facet hypertrophy. Minimal endplate spurring without disc bulge. No canal or foraminal stenosis. MRI THORACIC SPINE FINDINGS Alignment: Mild dextroscoliosis. Alignment otherwise normal with preservation of the normal thoracic kyphosis. No listhesis. Vertebrae: Vertebral body height maintained without evidence for acute or chronic fracture. Small endplate Schmorl's node at the superior endplate of [id number]. Underlying bone marrow signal intensity within normal limits. No discrete or worrisome osseous lesions. No abnormal marrow edema or enhancement. Cord: Question subtle patchy cord signal abnormality at the level of T6-7 (series 4, image 10). Additional possible subtle cord signal abnormality within the left hemi cord at the levels of T8-9 (series 7, image 25), and T9-10 (series 7, image 27). Findings consistent with underlying chronic demyelinating disease. No evidence for active demyelination. No other definite cord signal abnormality identified. Underlying cord caliber and morphology within normal limits. Conus terminates at approximately the level of L1. Paraspinal soft tissues: Paraspinous soft tissues demonstrate no acute finding. Partially visualized visceral structures unremarkable. Dependent atelectatic changes noted within the  visualized lungs. Disc levels: Minimal disc bulging seen at T11-12 and T12-L1 without significant stenosis. Mild posterior element hypertrophy at T9-10 through T12-L1. No significant canal or foraminal stenosis. No neural impingement. IMPRESSION: MRI HEAD IMPRESSION 1. Cerebral white matter disease compatible with history of multiple sclerosis, unchanged relative to previous exams. No evidence for active demyelination. 2. No other acute intracranial abnormality. MRI CERVICAL SPINE IMPRESSION 1. Subtle patchy cord signal abnormality at the levels of C2-C4, consistent with chronic demyelinating disease. Overall, appearance is relatively stable as compared to 2019 without evidence for significant disease progression. No evidence for active demyelination. 2. Degenerative disc osteophyte at C6-7 with resultant moderate spinal stenosis and mild mass effect on the left greater than right spinal cord. Moderate bilateral C7 foraminal narrowing is mildly progressed from previous. 3. Advanced degenerative disc osteophyte at C5-6 with resultant mild spinal stenosis and mild mass effect on the left hemi cord, similar to previous. MRI THORACIC SPINE IMPRESSION 1. Question subtle patchy cord signal abnormality at the level of  T6-7, T8-9, and T9-10 as above, suspicious for underlying chronic demyelinating disease. No evidence for active demyelination. 2. Mild degenerative spondylosis within the lower thoracic spine as above without significant canal or foraminal stenosis. No impingement. Electronically Signed   By: Jeannine Boga M.D.   On: 10/26/2018 21:45   Mr Jeri Cos [id number] Contrast  Result Date: 10/13/2018 CLINICAL DATA:  Multiple sclerosis. Feet numbness. EXAM: MRI HEAD WITHOUT AND WITH CONTRAST TECHNIQUE: Multiplanar, multiecho pulse sequences of the brain and surrounding structures were obtained without and with intravenous contrast. CONTRAST:  48mL GADAVIST GADOBUTROL 1 MMOL/ML IV SOLN COMPARISON:  Head CT 09/13/2018  and MRI 06/04/2017 FINDINGS: The study was performed under general anesthesia. Brain: No acute infarct, mass, midline shift, or extra-axial fluid collection is identified. The ventricles and sulci are normal. There is a single focus of chronic microhemorrhage in the posterolateral right frontal lobe, questionably present on the prior study's motion degraded susceptibility weighted imaging. There are numerous small foci of T2 hyperintensity in the cerebral white matter bilaterally with involvement of the periventricular, deep, and juxtacortical white matter. A greater number of punctate lesions are evident on today's study which was free of motion artifact and included thinner slices than the previous study, however the larger lesions appear unchanged and no significant interval progression is suspected. There is mild involvement of the corpus callosum. No lesions demonstrate restricted diffusion or enhancement, and no lesions are seen in the posterior fossa. Vascular: Major intracranial vascular flow voids are preserved. Skull and upper cervical spine: No suspicious marrow lesion. Sinuses/Orbits: Unremarkable orbits. Paranasal sinuses and mastoid air cells are clear. Other: None. IMPRESSION: Unchanged cerebral white matter disease consistent with multiple sclerosis. No evidence of active demyelination. Electronically Signed   By: Logan Bores M.D.   On: 10/13/2018 16:13   Mr Cervical Spine W Or Wo Contrast  Result Date: 10/26/2018 CLINICAL DATA:  Initial evaluation for multiple sclerosis, new neurologic event. EXAM: MRI HEAD WITHOUT AND WITH CONTRAST MRI CERVICAL SPINE WITHOUT AND WITH CONTRAST MRI THORACIC SPINE WITHOUT AND WITH CONTRAST TECHNIQUE: Multiplanar, multiecho pulse sequences of the brain and surrounding structures, and cervical spine, to include the craniocervical junction and cervicothoracic junction, as well as the thoracic spine were obtained without and with intravenous contrast. CONTRAST:   3mL GADAVIST GADOBUTROL 1 MMOL/ML IV SOLN COMPARISON:  Recent brain MRI from 10/13/2018 as well as previous cervical spine MRI from 03/06/2017. FINDINGS: MRI HEAD FINDINGS Brain: Cerebral volume stable, and remains within normal limits for age. No abnormal foci of restricted diffusion to suggest acute or subacute infarct. Gray-white matter differentiation maintained without evidence for chronic cortical infarction. No acute intracranial hemorrhage. Single subcentimeter focus of susceptibility artifact noted within the posterior right frontal region, likely a small chronic microhemorrhage, of doubtful significance in isolation. Again seen are multiple scattered foci of T2/FLAIR hyperintensity involving the supratentorial cerebral white matter bilaterally, with involvement of the periventricular, deep, and juxta cortical white matter. No significant involvement of the brainstem or cerebellum. Overall, appearance is relatively stable from previous, without evidence for significant disease progression. No associated restricted diffusion or enhancement to suggest active demyelination. Pituitary gland suprasellar region normal. Midline structures intact. Vascular: Major intracranial vascular flow voids are maintained. Skull and upper cervical spine: Craniocervical junction within normal limits. Bone marrow signal intensity normal. No scalp soft tissue abnormality. Sinuses/Orbits: Globes orbital soft tissues within normal limits. Mild scattered mucosal thickening noted within the ethmoidal air cells and maxillary sinuses. Paranasal sinuses are otherwise clear. Trace fluid  signal intensity within the mastoid air cells bilaterally, of doubtful significance. Inner ear structures grossly normal. Other: None. MRI CERVICAL SPINE FINDINGS Alignment: Chronic straightening of the normal cervical lordosis, stable. No listhesis or subluxation. Vertebrae: Vertebral body height maintained without evidence for acute or interval  fracture. Bone marrow signal intensity within normal limits. No discrete or worrisome osseous lesions. Progressive degenerative reactive endplate changes about the C5-6 and C6-7 interspace. No other abnormal marrow edema or enhancement. Cord: Subtle patchy T2/stir signal abnormality within the cervical spinal cord at the level of C2, C3, and C4 is not significantly changed as compared to 2019. No abnormal enhancement to suggest active demyelination. No evidence for significant disease progression. Posterior Fossa, vertebral arteries, paraspinal tissues: Craniocervical junction within normal limits. Paraspinous and prevertebral soft tissues normal. Normal intravascular flow voids seen within the vertebral arteries bilaterally. Disc levels: C2-C3: Moderate right with advanced left facet hypertrophy, stable from previous. Resultant mild left C3 foraminal narrowing. No significant canal or right neural foraminal stenosis. C3-C4: Moderate bilateral facet hypertrophy. No canal or foraminal stenosis. C4-C5: Shallow central disc protrusion mildly indents the ventral thecal sac. No significant spinal stenosis or cord deformity. Foramina remain patent. Appearance is unchanged. C5-C6: Chronic intervertebral disc space narrowing with diffuse degenerative disc osteophyte. Broad based left paracentral disc osteophyte complex flattens and effaces the ventral thecal sac, mildly flattening the left ventral cord. This appears similar to previous. Mild left C6 foraminal narrowing. No significant right foraminal encroachment. C6-C7: Chronic intervertebral disc space narrowing with diffuse circumferential disc osteophyte. Broad-based posterior component, asymmetric to the left flattens and largely effaces the ventral thecal sac. Secondary moderate spinal stenosis with flattening of the cervical spinal cord, greater on the left. No cord signal changes. Moderate bilateral C7 foraminal stenosis is stable to mildly progressed. C7-T1: Mild  bilateral facet hypertrophy. Minimal endplate spurring without disc bulge. No canal or foraminal stenosis. MRI THORACIC SPINE FINDINGS Alignment: Mild dextroscoliosis. Alignment otherwise normal with preservation of the normal thoracic kyphosis. No listhesis. Vertebrae: Vertebral body height maintained without evidence for acute or chronic fracture. Small endplate Schmorl's node at the superior endplate of [id number]. Underlying bone marrow signal intensity within normal limits. No discrete or worrisome osseous lesions. No abnormal marrow edema or enhancement. Cord: Question subtle patchy cord signal abnormality at the level of T6-7 (series 4, image 10). Additional possible subtle cord signal abnormality within the left hemi cord at the levels of T8-9 (series 7, image 25), and T9-10 (series 7, image 27). Findings consistent with underlying chronic demyelinating disease. No evidence for active demyelination. No other definite cord signal abnormality identified. Underlying cord caliber and morphology within normal limits. Conus terminates at approximately the level of L1. Paraspinal soft tissues: Paraspinous soft tissues demonstrate no acute finding. Partially visualized visceral structures unremarkable. Dependent atelectatic changes noted within the visualized lungs. Disc levels: Minimal disc bulging seen at T11-12 and T12-L1 without significant stenosis. Mild posterior element hypertrophy at T9-10 through T12-L1. No significant canal or foraminal stenosis. No neural impingement. IMPRESSION: MRI HEAD IMPRESSION 1. Cerebral white matter disease compatible with history of multiple sclerosis, unchanged relative to previous exams. No evidence for active demyelination. 2. No other acute intracranial abnormality. MRI CERVICAL SPINE IMPRESSION 1. Subtle patchy cord signal abnormality at the levels of C2-C4, consistent with chronic demyelinating disease. Overall, appearance is relatively stable as compared to 2019 without evidence  for significant disease progression. No evidence for active demyelination. 2. Degenerative disc osteophyte at C6-7 with resultant moderate spinal stenosis and  mild mass effect on the left greater than right spinal cord. Moderate bilateral C7 foraminal narrowing is mildly progressed from previous. 3. Advanced degenerative disc osteophyte at C5-6 with resultant mild spinal stenosis and mild mass effect on the left hemi cord, similar to previous. MRI THORACIC SPINE IMPRESSION 1. Question subtle patchy cord signal abnormality at the level of T6-7, T8-9, and T9-10 as above, suspicious for underlying chronic demyelinating disease. No evidence for active demyelination. 2. Mild degenerative spondylosis within the lower thoracic spine as above without significant canal or foraminal stenosis. No impingement. Electronically Signed   By: Jeannine Boga M.D.   On: 10/26/2018 21:45   Mr Thoracic Spine W Wo Contrast  Result Date: 10/26/2018 CLINICAL DATA:  Initial evaluation for multiple sclerosis, new neurologic event. EXAM: MRI HEAD WITHOUT AND WITH CONTRAST MRI CERVICAL SPINE WITHOUT AND WITH CONTRAST MRI THORACIC SPINE WITHOUT AND WITH CONTRAST TECHNIQUE: Multiplanar, multiecho pulse sequences of the brain and surrounding structures, and cervical spine, to include the craniocervical junction and cervicothoracic junction, as well as the thoracic spine were obtained without and with intravenous contrast. CONTRAST:  33mL GADAVIST GADOBUTROL 1 MMOL/ML IV SOLN COMPARISON:  Recent brain MRI from 10/13/2018 as well as previous cervical spine MRI from 03/06/2017. FINDINGS: MRI HEAD FINDINGS Brain: Cerebral volume stable, and remains within normal limits for age. No abnormal foci of restricted diffusion to suggest acute or subacute infarct. Gray-white matter differentiation maintained without evidence for chronic cortical infarction. No acute intracranial hemorrhage. Single subcentimeter focus of susceptibility artifact  noted within the posterior right frontal region, likely a small chronic microhemorrhage, of doubtful significance in isolation. Again seen are multiple scattered foci of T2/FLAIR hyperintensity involving the supratentorial cerebral white matter bilaterally, with involvement of the periventricular, deep, and juxta cortical white matter. No significant involvement of the brainstem or cerebellum. Overall, appearance is relatively stable from previous, without evidence for significant disease progression. No associated restricted diffusion or enhancement to suggest active demyelination. Pituitary gland suprasellar region normal. Midline structures intact. Vascular: Major intracranial vascular flow voids are maintained. Skull and upper cervical spine: Craniocervical junction within normal limits. Bone marrow signal intensity normal. No scalp soft tissue abnormality. Sinuses/Orbits: Globes orbital soft tissues within normal limits. Mild scattered mucosal thickening noted within the ethmoidal air cells and maxillary sinuses. Paranasal sinuses are otherwise clear. Trace fluid signal intensity within the mastoid air cells bilaterally, of doubtful significance. Inner ear structures grossly normal. Other: None. MRI CERVICAL SPINE FINDINGS Alignment: Chronic straightening of the normal cervical lordosis, stable. No listhesis or subluxation. Vertebrae: Vertebral body height maintained without evidence for acute or interval fracture. Bone marrow signal intensity within normal limits. No discrete or worrisome osseous lesions. Progressive degenerative reactive endplate changes about the C5-6 and C6-7 interspace. No other abnormal marrow edema or enhancement. Cord: Subtle patchy T2/stir signal abnormality within the cervical spinal cord at the level of C2, C3, and C4 is not significantly changed as compared to 2019. No abnormal enhancement to suggest active demyelination. No evidence for significant disease progression. Posterior  Fossa, vertebral arteries, paraspinal tissues: Craniocervical junction within normal limits. Paraspinous and prevertebral soft tissues normal. Normal intravascular flow voids seen within the vertebral arteries bilaterally. Disc levels: C2-C3: Moderate right with advanced left facet hypertrophy, stable from previous. Resultant mild left C3 foraminal narrowing. No significant canal or right neural foraminal stenosis. C3-C4: Moderate bilateral facet hypertrophy. No canal or foraminal stenosis. C4-C5: Shallow central disc protrusion mildly indents the ventral thecal sac. No significant spinal stenosis or  cord deformity. Foramina remain patent. Appearance is unchanged. C5-C6: Chronic intervertebral disc space narrowing with diffuse degenerative disc osteophyte. Broad based left paracentral disc osteophyte complex flattens and effaces the ventral thecal sac, mildly flattening the left ventral cord. This appears similar to previous. Mild left C6 foraminal narrowing. No significant right foraminal encroachment. C6-C7: Chronic intervertebral disc space narrowing with diffuse circumferential disc osteophyte. Broad-based posterior component, asymmetric to the left flattens and largely effaces the ventral thecal sac. Secondary moderate spinal stenosis with flattening of the cervical spinal cord, greater on the left. No cord signal changes. Moderate bilateral C7 foraminal stenosis is stable to mildly progressed. C7-T1: Mild bilateral facet hypertrophy. Minimal endplate spurring without disc bulge. No canal or foraminal stenosis. MRI THORACIC SPINE FINDINGS Alignment: Mild dextroscoliosis. Alignment otherwise normal with preservation of the normal thoracic kyphosis. No listhesis. Vertebrae: Vertebral body height maintained without evidence for acute or chronic fracture. Small endplate Schmorl's node at the superior endplate of [id number]. Underlying bone marrow signal intensity within normal limits. No discrete or worrisome osseous  lesions. No abnormal marrow edema or enhancement. Cord: Question subtle patchy cord signal abnormality at the level of T6-7 (series 4, image 10). Additional possible subtle cord signal abnormality within the left hemi cord at the levels of T8-9 (series 7, image 25), and T9-10 (series 7, image 27). Findings consistent with underlying chronic demyelinating disease. No evidence for active demyelination. No other definite cord signal abnormality identified. Underlying cord caliber and morphology within normal limits. Conus terminates at approximately the level of L1. Paraspinal soft tissues: Paraspinous soft tissues demonstrate no acute finding. Partially visualized visceral structures unremarkable. Dependent atelectatic changes noted within the visualized lungs. Disc levels: Minimal disc bulging seen at T11-12 and T12-L1 without significant stenosis. Mild posterior element hypertrophy at T9-10 through T12-L1. No significant canal or foraminal stenosis. No neural impingement. IMPRESSION: MRI HEAD IMPRESSION 1. Cerebral white matter disease compatible with history of multiple sclerosis, unchanged relative to previous exams. No evidence for active demyelination. 2. No other acute intracranial abnormality. MRI CERVICAL SPINE IMPRESSION 1. Subtle patchy cord signal abnormality at the levels of C2-C4, consistent with chronic demyelinating disease. Overall, appearance is relatively stable as compared to 2019 without evidence for significant disease progression. No evidence for active demyelination. 2. Degenerative disc osteophyte at C6-7 with resultant moderate spinal stenosis and mild mass effect on the left greater than right spinal cord. Moderate bilateral C7 foraminal narrowing is mildly progressed from previous. 3. Advanced degenerative disc osteophyte at C5-6 with resultant mild spinal stenosis and mild mass effect on the left hemi cord, similar to previous. MRI THORACIC SPINE IMPRESSION 1. Question subtle patchy cord  signal abnormality at the level of T6-7, T8-9, and T9-10 as above, suspicious for underlying chronic demyelinating disease. No evidence for active demyelination. 2. Mild degenerative spondylosis within the lower thoracic spine as above without significant canal or foraminal stenosis. No impingement. Electronically Signed   By: Jeannine Boga M.D.   On: 10/26/2018 21:45     The results of significant diagnostics from this hospitalization (including imaging, microbiology, ancillary and laboratory) are listed below for reference.     Microbiology: Recent Results (from the past 240 hour(s))  SARS CORONAVIRUS 2 (TAT 6-24 HRS) Nasopharyngeal Nasopharyngeal Swab     Status: None   Collection Time: 10/24/18  8:40 PM   Specimen: Nasopharyngeal Swab  Result Value Ref Range Status   SARS Coronavirus 2 NEGATIVE NEGATIVE Final    Comment: (NOTE) SARS-CoV-2 target nucleic acids are NOT  DETECTED. The SARS-CoV-2 RNA is generally detectable in upper and lower respiratory specimens during the acute phase of infection. Negative results do not preclude SARS-CoV-2 infection, do not rule out co-infections with other pathogens, and should not be used as the sole basis for treatment or other patient management decisions. Negative results must be combined with clinical observations, patient history, and epidemiological information. The expected result is Negative. Fact Sheet for Patients: SugarRoll.be Fact Sheet for Healthcare Providers: https://www.woods-mathews.com/ This test is not yet approved or cleared by the [place] FDA and  has been authorized for detection and/or diagnosis of SARS-CoV-2 by FDA under an Emergency Use Authorization (EUA). This EUA will remain  in effect (meaning this test can be used) for the duration of the COVID-19 declaration under Section 56 4(b)(1) of the Act, 21 U.S.C. section 360bbb-3(b)(1), unless the authorization is  terminated or revoked sooner. Performed at [facility], [place] [address], [place], [place] [address]      Labs: BNP (last 3 results) No results for input(s): BNP in the last 8760 hours. Basic Metabolic Panel: Recent Labs  Lab 10/25/18 0500 10/26/18 0608 10/27/18 0456  NA 138 141 139  K 3.4* 3.8 3.3*  CL 108 110 109  CO2 21* 21* 20*  GLUCOSE 96 91 89  BUN 11 11 8   CREATININE 0.93 0.97 0.86  CALCIUM 9.1 8.8* 8.7*   Liver Function Tests: No results for input(s): AST, ALT, ALKPHOS, BILITOT, PROT, ALBUMIN in the last 168 hours. No results for input(s): LIPASE, AMYLASE in the last 168 hours. No results for input(s): AMMONIA in the last 168 hours. CBC: Recent Labs  Lab 10/25/18 0500 10/26/18 0608  WBC 9.1 5.9  NEUTROABS 7.6  --   HGB 13.8 13.8  HCT 42.4 42.3  MCV 91.2 90.2  PLT 435* 374   Cardiac Enzymes: No results for input(s): CKTOTAL, CKMB, CKMBINDEX, TROPONINI in the last 168 hours. BNP: Invalid input(s): POCBNP CBG: No results for input(s): GLUCAP in the last 168 hours. D-Dimer No results for input(s): DDIMER in the last 72 hours. Hgb A1c No results for input(s): HGBA1C in the last 72 hours. Lipid Profile No results for input(s): CHOL, HDL, LDLCALC, TRIG, CHOLHDL, LDLDIRECT in the last 72 hours. Thyroid function studies No results for input(s): TSH, T4TOTAL, T3FREE, THYROIDAB in the last 72 hours.  Invalid input(s): FREET3 Anemia work up Recent Labs    10/26/18 1206  VITAMINB12 274   Urinalysis    Component Value Date/Time   COLORURINE YELLOW 10/25/2018 1657   APPEARANCEUR HAZY (A) 10/25/2018 1657   LABSPEC 1.019 10/25/2018 1657   PHURINE 6.0 10/25/2018 1657   GLUCOSEU NEGATIVE 10/25/2018 1657   HGBUR NEGATIVE 10/25/2018 1657   [place] 10/25/2018 1657   KETONESUR NEGATIVE 10/25/2018 1657   PROTEINUR NEGATIVE 10/25/2018 1657   UROBILINOGEN 0.2 09/07/2015 0951   NITRITE NEGATIVE 10/25/2018 [place]  10/25/2018 1657   Sepsis Labs Invalid input(s): PROCALCITONIN,  WBC,  LACTICIDVEN Microbiology Recent Results (from the past 240 hour(s))  SARS CORONAVIRUS 2 (TAT 6-24 HRS) Nasopharyngeal Nasopharyngeal Swab     Status: None   Collection Time: 10/24/18  8:40 PM   Specimen: Nasopharyngeal Swab  Result Value Ref Range Status   SARS Coronavirus 2 NEGATIVE NEGATIVE Final    Comment: (NOTE) SARS-CoV-2 target nucleic acids are NOT DETECTED. The SARS-CoV-2 RNA is generally detectable in upper and lower respiratory specimens during the acute phase of infection. Negative results do not preclude SARS-CoV-2 infection,  do not rule out co-infections with other pathogens, and should not be used as the sole basis for treatment or other patient management decisions. Negative results must be combined with clinical observations, patient history, and epidemiological information. The expected result is Negative. Fact Sheet for Patients: SugarRoll.be Fact Sheet for Healthcare Providers: https://www.woods-mathews.com/ This test is not yet approved or cleared by the [place] FDA and  has been authorized for detection and/or diagnosis of SARS-CoV-2 by FDA under an Emergency Use Authorization (EUA). This EUA will remain  in effect (meaning this test can be used) for the duration of the COVID-19 declaration under Section 56 4(b)(1) of the Act, 21 U.S.C. section 360bbb-3(b)(1), unless the authorization is terminated or revoked sooner. Performed at [facility], [place] [address], [place], [place] [address]      Time coordinating discharge in minutes: 39  SIGNED:   Debbe Odea, MD  [facility] 10/27/2018, 12:05 PM Pager   If 7PM-7AM, please contact night-coverage [email or web address] Password [email or web address]

## 2018-10-28 ENCOUNTER — Telehealth: Payer: Self-pay | Admitting: *Deleted

## 2018-10-28 MED ORDER — MAGIC MOUTHWASH W/LIDOCAINE: 30.0000 mL | Freq: Three times a day (TID) | ORAL | 0 refills | Status: DC | PRN

## 2018-10-28 NOTE — Telephone Encounter (Signed)
Patient is now calling into the office about the medication. Thanks!

## 2018-10-28 NOTE — Telephone Encounter (Signed)
Patient is calling in again about the medication. Hard to breathe from intubation tube.

## 2018-10-28 NOTE — Telephone Encounter (Signed)
Opened in error

## 2018-10-28 NOTE — Telephone Encounter (Signed)
Pls let her know that this is not really a neurological issue and that in the future she needs to speak to her PCP about these types of issues. In my patients who have bitten their tongue from seizures, I prescribe a magic mouthwash to gargle and this may help her if she would like to try this, otherwise speak to PCP. Also pls let her know that with all the symptoms she was having, they are not from any MS flare. Anxiety can cause her to feel this way, very important to follow-up regularly with psychiatrist and therapist. Thanks  Magic mouthwash: Diphenhydramine 12.5 mg/5 mL 1 part Viscous lidocaine 2% 1 part Maalox 1 part  Swish, hold, and spit 30 mL TID prn

## 2018-10-28 NOTE — Telephone Encounter (Addendum)
Called and informed patient of all below MD information from MD and she does want the magic mouthwash called in. Verified will be [facility]. Printed , signed, and faxed magic mouthwash and sent to pharmacy.

## 2018-10-28 NOTE — Telephone Encounter (Signed)
Route to [employer]

## 2018-10-28 NOTE — Addendum Note (Signed)
Addended by: Jesse Fall on: 10/28/2018 01:45 PM   Modules accepted: Orders

## 2018-10-28 NOTE — Telephone Encounter (Signed)
Left message with the after hour service on 10-28-18 12:53 pm   Caller needs a call back from the office

## 2018-10-28 NOTE — Telephone Encounter (Addendum)
Patient c/o irritation from intubation tube from being intubated for MRI on 10/19 pm (tonight at 1900 will be 48 hours). States throat really hurts and is unable to eat or drink. Minimal amount of water.   Tried OTC sore throat liquid meds without relief.  Concerned about getting dehydrated and not being able to take care of her daughter.   Have been intubated before without problems. Nothing noted in anesthesia notes there was any issues this time.  Informed patient that it is normal to have throat irritation post intubation for 24-48 hours afterwards. Suggested trying to take small sips of liquid. She asked if Dr. Delice Lesch could prescribe anything for her and I said she doesn't prescribe pain meds but I will let her know and call patient back this afternoon.  Verbalized understanding.

## 2018-10-28 NOTE — Telephone Encounter (Signed)
Left message with the after hour service 10-27-18 @ 5:06 pm     Caller wants to know if she can have medication for pain r/t an intubation tube   Had to be intubated for MRI today ( claustrophobia ) caller states that throat hurts pain 8/10 hurts to swallow fluids ice cream caller states last UPO around 1600. Has taken Nyquil without relief caller states cant take tylenol ibuprofen because of a sensitive stomach

## 2018-10-29 ENCOUNTER — Other Ambulatory Visit: Payer: Self-pay

## 2018-10-29 ENCOUNTER — Telehealth: Payer: Self-pay | Admitting: Neurology

## 2018-10-29 ENCOUNTER — Ambulatory Visit (INDEPENDENT_AMBULATORY_CARE_PROVIDER_SITE_OTHER): Payer: Medicare Other | Admitting: Psychiatry

## 2018-10-29 ENCOUNTER — Encounter (HOSPITAL_COMMUNITY): Payer: Self-pay | Admitting: Psychiatry

## 2018-10-29 DIAGNOSIS — G43611 Persistent migraine aura with cerebral infarction, intractable, with status migrainosus: Secondary | ICD-10-CM

## 2018-10-29 DIAGNOSIS — F332 Major depressive disorder, recurrent severe without psychotic features: Secondary | ICD-10-CM

## 2018-10-29 DIAGNOSIS — I639 Cerebral infarction, unspecified: Secondary | ICD-10-CM

## 2018-10-29 MED ORDER — DIAZEPAM 5 MG PO TABS: 5.0000 mg | ORAL_TABLET | Freq: Every day | ORAL | 0 refills | Status: DC | PRN

## 2018-10-29 NOTE — Telephone Encounter (Signed)
Spoke with Ronnetta. Talked with her yesterday regarding magic mouthwash from sore throat from MRI /intubation on 10/19. She said today her throat is a little better now. Asked her about the message she left today with after hours about being in the hospital for 4 days. She was outpatient on 10/19 and has been home since then. She stated when she was on the MRI table 3 days ago it made her back hurt and it is still bothering her. Informed her Dr. Delice Lesch does not prescribe pain meds and she has been requested to contact her PCP. She has currently been d/c from her PCP and is waiting to see a new one. Informed patient Dr. Delice Lesch is out of the office and let her know will pass this message to MD and she will see it in the am. Patient verbalizes understanding of this and also is aware that Dr. Delice Lesch doesn't prescribe pain meds.
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
## 2018-10-31 ENCOUNTER — Other Ambulatory Visit (HOSPITAL_COMMUNITY): Payer: Self-pay | Admitting: Psychiatry

## 2018-11-02 ENCOUNTER — Telehealth (HOSPITAL_COMMUNITY): Payer: Self-pay

## 2018-11-02 ENCOUNTER — Telehealth: Payer: Self-pay | Admitting: Neurology
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
## 2018-11-02 NOTE — Telephone Encounter (Addendum)
Dr. Delice Lesch,  Is there another location in [place]?

## 2018-11-02 NOTE — Telephone Encounter (Signed)
Patient states that she needs a referral out to a pain management somewhere around [place]   She can not go back to preferred pain management clinic she missed to many appts   She does not want to to go to Dr Merlene Laughter again

## 2018-11-04 NOTE — Telephone Encounter (Signed)
If she has anyone in mind, we can send referral to her preferred place, otherwise I am not familiar, aside from just doing an online search and seeing [facility], but I don't know anything about the place. Thanks
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
## 2018-11-05 NOTE — Telephone Encounter (Signed)
This is psychiatric, she needs to call the patient's psychiatrist.

## 2018-11-05 NOTE — Telephone Encounter (Signed)
We will discuss at her next appt. I am not going to prescribe any stimulants

## 2018-11-06 ENCOUNTER — Telehealth: Payer: Self-pay | Admitting: Neurology

## 2018-11-06 ENCOUNTER — Telehealth (HOSPITAL_COMMUNITY): Payer: Self-pay
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
## 2018-11-06 NOTE — Telephone Encounter (Signed)
Left message for mom to return call
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
## 2018-11-06 NOTE — Telephone Encounter (Signed)
Patient's mother returned your call. [phone]. Thank you

## 2018-11-10 DIAGNOSIS — L02211 Cutaneous abscess of abdominal wall: Secondary | ICD-10-CM | POA: Diagnosis not present

## 2018-11-12 ENCOUNTER — Emergency Department (HOSPITAL_COMMUNITY): Payer: Medicare Other

## 2018-11-12 ENCOUNTER — Telehealth (HOSPITAL_COMMUNITY): Payer: Self-pay

## 2018-11-12 ENCOUNTER — Other Ambulatory Visit: Payer: Self-pay

## 2018-11-12 ENCOUNTER — Encounter (HOSPITAL_COMMUNITY): Payer: Self-pay | Admitting: Emergency Medicine

## 2018-11-12 ENCOUNTER — Emergency Department (HOSPITAL_COMMUNITY)
Admission: EM | Admit: 2018-11-12 | Discharge: 2018-11-12 | Disposition: A | Discharge status: Home / Self Care | Payer: Medicare Other | Attending: Emergency Medicine | Admitting: Emergency Medicine

## 2018-11-12 DIAGNOSIS — L02211 Cutaneous abscess of abdominal wall: Secondary | ICD-10-CM | POA: Diagnosis not present

## 2018-11-12 DIAGNOSIS — J45909 Unspecified asthma, uncomplicated: Secondary | ICD-10-CM | POA: Diagnosis not present

## 2018-11-12 DIAGNOSIS — Z79899 Other long term (current) drug therapy: Secondary | ICD-10-CM | POA: Diagnosis not present

## 2018-11-12 DIAGNOSIS — Z87891 Personal history of nicotine dependence: Secondary | ICD-10-CM | POA: Insufficient documentation

## 2018-11-12 DIAGNOSIS — G35 Multiple sclerosis: Secondary | ICD-10-CM | POA: Diagnosis not present

## 2018-11-12 DIAGNOSIS — I10 Essential (primary) hypertension: Secondary | ICD-10-CM | POA: Insufficient documentation

## 2018-11-12 DIAGNOSIS — N200 Calculus of kidney: Secondary | ICD-10-CM | POA: Diagnosis not present

## 2018-11-12 DIAGNOSIS — M8648 Chronic osteomyelitis with draining sinus, other site: Secondary | ICD-10-CM | POA: Diagnosis not present

## 2018-11-12 DIAGNOSIS — L988 Other specified disorders of the skin and subcutaneous tissue: Secondary | ICD-10-CM | POA: Diagnosis present

## 2018-11-12 LAB — CBC WITH DIFFERENTIAL/PLATELET
Abs Immature Granulocytes: 0.02 10*3/uL (ref 0.00–0.07)
Basophils Absolute: 0 10*3/uL (ref 0.0–0.1)
Basophils Relative: 0 %
Eosinophils Absolute: 0 10*3/uL (ref 0.0–0.5)
Eosinophils Relative: 1 %
HCT: 43.9 % (ref 36.0–46.0)
Hemoglobin: 14 g/dL (ref 12.0–15.0)
Immature Granulocytes: 0 %
Lymphocytes Relative: 16 %
Lymphs Abs: 0.9 10*3/uL (ref 0.7–4.0)
MCH: 29.5 pg (ref 26.0–34.0)
MCHC: 31.9 g/dL (ref 30.0–36.0)
MCV: 92.6 fL (ref 80.0–100.0)
Monocytes Absolute: 0.4 10*3/uL (ref 0.1–1.0)
Monocytes Relative: 8 %
Neutro Abs: 4.2 10*3/uL (ref 1.7–7.7)
Neutrophils Relative %: 75 %
Platelets: 427 10*3/uL — ABNORMAL HIGH (ref 150–400)
RBC: 4.74 MIL/uL (ref 3.87–5.11)
RDW: 13.4 % (ref 11.5–15.5)
WBC: 5.6 10*3/uL (ref 4.0–10.5)
nRBC: 0 % (ref 0.0–0.2)

## 2018-11-12 LAB — COMPREHENSIVE METABOLIC PANEL
ALT: 22 U/L (ref 0–44)
AST: 19 U/L (ref 15–41)
Albumin: 3.9 g/dL (ref 3.5–5.0)
Alkaline Phosphatase: 82 U/L (ref 38–126)
Anion gap: 9 (ref 5–15)
BUN: 11 mg/dL (ref 6–20)
CO2: 25 mmol/L (ref 22–32)
Calcium: 8.8 mg/dL — ABNORMAL LOW (ref 8.9–10.3)
Chloride: 105 mmol/L (ref 98–111)
Creatinine, Ser: 0.79 mg/dL (ref 0.44–1.00)
GFR calc Af Amer: >60 mL/min (ref >60)
GFR calc non Af Amer: >60 mL/min (ref >60)
Glucose, Bld: 92 mg/dL (ref 70–99)
Potassium: 3.2 mmol/L — ABNORMAL LOW (ref 3.5–5.1)
Sodium: 139 mmol/L (ref 135–145)
Total Bilirubin: 0.5 mg/dL (ref 0.3–1.2)
Total Protein: 7.1 g/dL (ref 6.5–8.1)

## 2018-11-12 LAB — POC URINE PREG, ED: Preg Test, Ur: NEGATIVE

## 2018-11-12 LAB — LACTIC ACID, PLASMA: Lactic Acid, Venous: 1 mmol/L (ref 0.5–1.9)

## 2018-11-12 MED ORDER — ACETAMINOPHEN 325 MG PO TABS
650.0000 mg | ORAL_TABLET | Freq: Once | ORAL | Status: DC
  Filled 2018-11-12: qty 2

## 2018-11-12 MED ORDER — IOHEXOL 300 MG/ML  SOLN
100.0000 mL | Freq: Once | INTRAMUSCULAR | Status: AC | PRN
  Administered 2018-11-12: 100 mL via INTRAVENOUS

## 2018-11-12 MED ORDER — SULFAMETHOXAZOLE-TRIMETHOPRIM 800-160 MG PO TABS: 1.0000 | ORAL_TABLET | Freq: Two times a day (BID) | ORAL | 0 refills | Status: AC
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
## 2018-11-12 NOTE — Telephone Encounter (Signed)
Received fax from [facility] on [address] in [place] requesting a refill on her Haloperidol 1mg . She has a scheduled appointment on 12/10/18. Thank you.

## 2018-11-12 NOTE — ED Notes (Signed)
Pt called out saying she "has to go now". Percell Miller, [place] made aware.

## 2018-11-12 NOTE — ED Notes (Signed)
Small lower med incision hole noted with tan drainage noted on gauze.  C/o sharp pain rating pain 10/10.

## 2018-11-12 NOTE — ED Notes (Signed)
Pt states, "do not have time for you to check my vitals again, my ride is here."

## 2018-11-12 NOTE — ED Triage Notes (Signed)
Pt states that she has a abscess sx drained on her lower abd she is hurting bad and she is having drainage from it.

## 2018-11-12 NOTE — Discharge Instructions (Addendum)
Take antibiotics as prescribed and complete the full course. Follow up with general surgery.

## 2018-11-15 LAB — AEROBIC CULTURE W GRAM STAIN (SUPERFICIAL SPECIMEN)

## 2018-11-16 ENCOUNTER — Telehealth: Payer: Self-pay | Admitting: Emergency Medicine

## 2018-11-16 NOTE — Telephone Encounter (Signed)
Post ED Visit - Positive Culture Follow-up  Culture report reviewed by antimicrobial stewardship pharmacist: [place] Team []  Elenor Quinones, Pharm.D. []  Heide Guile, Pharm.D., BCPS AQ-ID []  Parks Neptune, Pharm.D., BCPS []  Alycia Rossetti, Pharm.D., BCPS []  [place], [place].D., BCPS, AAHIVP []  Legrand Como, Pharm.D., BCPS, AAHIVP []  Salome Arnt, PharmD, BCPS []  Johnnette Gourd, PharmD, BCPS []  Hughes Better, PharmD, BCPS []  Leeroy Cha, PharmD []  Laqueta Linden, PharmD, BCPS []  Albertina Parr, PharmD  [place] Team []  Leodis Sias, PharmD []  Lindell Spar, PharmD []  Royetta Asal, PharmD []  Graylin Shiver, Rph []  Rema Fendt) Glennon Mac, PharmD []  Arlyn Dunning, PharmD []  Netta Cedars, PharmD []  Dia Sitter, PharmD []  Leone Haven, PharmD []  Gretta Arab, PharmD []  Theodis Shove, PharmD []  Peggyann Juba, PharmD []  Reuel Boom, PharmD   Positive wound culture Treated with sulfamethoxazole-trimethoprim, organism sensitive to the same and no further patient follow-up is required at this time.  Hazle Nordmann 11/16/2018, 11:38 AM

## 2018-11-17 ENCOUNTER — Other Ambulatory Visit (HOSPITAL_COMMUNITY): Payer: Self-pay | Admitting: Psychiatry

## 2018-11-17 DIAGNOSIS — L089 Local infection of the skin and subcutaneous tissue, unspecified: Secondary | ICD-10-CM | POA: Diagnosis not present

## 2018-11-17 DIAGNOSIS — G35 Multiple sclerosis: Secondary | ICD-10-CM | POA: Diagnosis not present

## 2018-11-17 DIAGNOSIS — I10 Essential (primary) hypertension: Secondary | ICD-10-CM | POA: Diagnosis not present

## 2018-11-17 DIAGNOSIS — T8131XD Disruption of external operation (surgical) wound, not elsewhere classified, subsequent encounter: Secondary | ICD-10-CM | POA: Diagnosis not present

## 2018-11-17 DIAGNOSIS — L7682 Other postprocedural complications of skin and subcutaneous tissue: Secondary | ICD-10-CM | POA: Diagnosis not present

## 2018-11-17 DIAGNOSIS — F445 Conversion disorder with seizures or convulsions: Secondary | ICD-10-CM

## 2018-11-17 DIAGNOSIS — F332 Major depressive disorder, recurrent severe without psychotic features: Secondary | ICD-10-CM

## 2018-11-17 MED ORDER — HALOPERIDOL 1 MG PO TABS: 1.0000 mg | ORAL_TABLET | Freq: Two times a day (BID) | ORAL | 0 refills | Status: DC

## 2018-11-17 NOTE — Progress Notes (Signed)
Refill Haldol

## 2018-11-22 ENCOUNTER — Emergency Department (HOSPITAL_COMMUNITY)
Admission: EM | Admit: 2018-11-22 | Discharge: 2018-11-22 | Disposition: A | Discharge status: Home / Self Care | Payer: Medicare Other | Attending: Emergency Medicine | Admitting: Emergency Medicine

## 2018-11-22 ENCOUNTER — Other Ambulatory Visit: Payer: Self-pay

## 2018-11-22 ENCOUNTER — Encounter (HOSPITAL_COMMUNITY): Payer: Self-pay | Admitting: *Deleted

## 2018-11-22 DIAGNOSIS — J45909 Unspecified asthma, uncomplicated: Secondary | ICD-10-CM | POA: Insufficient documentation

## 2018-11-22 DIAGNOSIS — Z87891 Personal history of nicotine dependence: Secondary | ICD-10-CM | POA: Insufficient documentation

## 2018-11-22 DIAGNOSIS — M5442 Lumbago with sciatica, left side: Secondary | ICD-10-CM | POA: Diagnosis not present

## 2018-11-22 DIAGNOSIS — Z79899 Other long term (current) drug therapy: Secondary | ICD-10-CM | POA: Diagnosis not present

## 2018-11-22 DIAGNOSIS — I10 Essential (primary) hypertension: Secondary | ICD-10-CM | POA: Insufficient documentation

## 2018-11-22 DIAGNOSIS — M545 Low back pain: Secondary | ICD-10-CM | POA: Diagnosis present

## 2018-11-22 DIAGNOSIS — M5440 Lumbago with sciatica, unspecified side: Secondary | ICD-10-CM

## 2018-11-22 DIAGNOSIS — M5489 Other dorsalgia: Secondary | ICD-10-CM | POA: Diagnosis not present

## 2018-11-22 MED ORDER — PREDNISONE 10 MG PO TABS: 20.0000 mg | ORAL_TABLET | Freq: Every day | ORAL | 0 refills | Status: DC

## 2018-11-22 MED ORDER — CYCLOBENZAPRINE HCL 10 MG PO TABS: 10.0000 mg | ORAL_TABLET | Freq: Three times a day (TID) | ORAL | 0 refills | Status: DC | PRN

## 2018-11-22 MED ORDER — LORAZEPAM 2 MG/ML IJ SOLN
0.2500 mg | Freq: Once | INTRAMUSCULAR | Status: AC
  Administered 2018-11-22: 0.25 mg via INTRAVENOUS
  Filled 2018-11-22: qty 1

## 2018-11-22 MED ORDER — METHYLPREDNISOLONE SODIUM SUCC 125 MG IJ SOLR
125.0000 mg | Freq: Once | INTRAMUSCULAR | Status: AC
  Administered 2018-11-22: 125 mg via INTRAVENOUS
  Filled 2018-11-22: qty 2

## 2018-11-22 MED ORDER — HYDROMORPHONE HCL 1 MG/ML IJ SOLN
1.0000 mg | Freq: Once | INTRAMUSCULAR | Status: AC
  Administered 2018-11-22: 1 mg via INTRAVENOUS
  Filled 2018-11-22: qty 1

## 2018-11-22 NOTE — ED Provider Notes (Signed)
[facility] EMERGENCY DEPARTMENT Provider Note   CSN: [id number] Arrival date & time: 11/22/18  1301     History   Chief Complaint Chief Complaint  Patient presents with  . Back Pain    HPI Emily [name] is a 38 y.o. female.     Patient complains of back pain with radiation down left leg.  Patient has history of back pain  The history is provided by the patient. No language interpreter was used.  Back Pain Location:  Lumbar spine Quality:  Aching Pain severity:  Moderate Pain is:  Worse during the day Onset quality:  Sudden Timing:  Constant Progression:  Waxing and waning Chronicity:  New Context: not emotional stress   Associated symptoms: no abdominal pain, no chest pain and no headaches     Past Medical History:  Diagnosis Date  . ADHD   . Anxiety   . Asthma as a child  . Chronic back pain   . Chronic pain   . Conversion disorder   . DDD (degenerative disc disease), cervical   . Depression   . External hemorrhoid   . Fatty liver   . GERD (gastroesophageal reflux disease)   . History of kidney stones   . HTN (hypertension) 02/27/2015  . JC virus antibody positive   . Migraines   . MS (multiple sclerosis) ([place])    06-2006  . Neuropathy   . OA (osteoarthritis)   . Ovarian cyst   . Panic attack   . Perforated bowel ([place]) 2009   colostomy bag for 3 motnhs  . 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 PTSD (post-traumatic stress disorder)   . S/P emergency C-section   . Seasonal allergies   . Urinary urgency   . Vertigo   . Wears contact lenses   . Wears glasses     Patient Active Problem List   Diagnosis Date Noted  . S/P emergency C-section   . Pseudoseizures   . MS (multiple sclerosis) ([place])   . ADHD   . Anxiety   . Chronic pain   . Panic attack   . Open wound of abdominal wall, anterior, complicated, sequela [id number]  . Postoperative wound infection 09/20/2018  . Acute postoperative pain 08/25/2018   . History of substance use disorder 07/19/2018  . Chronic nausea 04/13/2018  . Nausea with vomiting 03/23/2018  . Pharmacologic therapy 03/16/2018  . Disorder of skeletal system 03/16/2018  . Problems influencing health status 03/16/2018  . Abnormal MRI, cervical spine (03/06/17) 03/16/2018  . Morbid obesity with BMI of 45.0-49.9, adult ([place]) 03/16/2018  . Chronic pain of both knees 02/18/2018  . Cervical facet hypertrophy 04/29/2017  . Cervical facet syndrome (Bilateral) (R>L) 04/29/2017  . Spondylosis without myelopathy or radiculopathy, cervical region 04/29/2017  . Spondylosis without myelopathy or radiculopathy, lumbosacral region 03/04/2017  . Cervicogenic headache 02/10/2017  . Occipital headache 02/10/2017  . Trigger point with back pain (Left) 02/10/2017  . History of fainting (vasovagal) 02/10/2017  . History of vasovagal syncope 02/10/2017  . Acute left-sided low back pain without sciatica 02/10/2017  . Neurogenic pain 01/23/2017  . Chronic musculoskeletal pain 12/02/2016  . Cervical radiculitis (Bilateral) 11/12/2016  . Chronic upper back pain (Primary Area of Pain) (Bilateral) (R>L) 11/01/2016  . Chronic hand pain [facility] Area of Pain) (Bilateral) (L>R) 11/01/2016  . Chronic wrist pain [facility] Area of Pain) (Bilateral) (L>R) 11/01/2016  . Chronic foot/toes pain (Fourth Area of Pain) (Bilateral) (R>L)  11/01/2016  . Chronic upper extremity pain (Secondary Area of Pain) (Bilateral) (L>R) 11/01/2016  . Long term prescription benzodiazepine use 11/01/2016  . 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 Cervical foraminal stenosis (Bilateral) (C6-7) 11/01/2016  . 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 DDD (degenerative disc disease), thoracic 11/01/2016  . DDD (degenerative disc disease), lumbar 11/01/2016  . Lumbar facet arthropathy 11/01/2016  . Lumbar facet syndrome (Bilateral) (R>L) 11/01/2016  . Vitamin D deficiency 10/28/2016  .  Opiate use 07/23/2016  . Chronic neck pain (Primary Area of Pain) (Bilateral) (R>L) 07/23/2016  . Chronic low back pain (Secondary Area of Pain) (Bilateral) (L>R) 07/23/2016  . Chronic knee pain (Fifth Area of Pain) (Left) 07/23/2016  . Midline thoracic back pain 07/23/2016  . Chronic lower extremity pain (Bilateral) 07/23/2016  . Incarcerated incisional hernia 02/16/2016  . Incisional hernia 02/15/2016  . Carpal tunnel syndrome [facility] Area of Pain) (Bilateral) (L>R) 11/13/2015  . Substance abuse ([place]) 09/07/2015  . Paresthesia 08/02/2015  . Chronic pain syndrome 08/02/2015  . Postpartum endometritis 07/21/2015  . PTSD (post-traumatic stress disorder) 05/16/2015  . Conversion disorder with seizures or convulsions 05/16/2015  . Severe episode of recurrent major depressive disorder, without psychotic features ([place]) 05/16/2015  . GAD (generalized anxiety disorder) 05/16/2015  . Difficult intravenous access 05/08/2015  . Right optic neuritis 04/27/2015  . Conversion disorder with attacks or seizures, persistent, with psychological stressor 03/28/2015  . Migraine with aura and without status migrainosus, not intractable 03/28/2015  . Generalized anxiety disorder 03/28/2015  . HTN (hypertension) 02/27/2015  . Perforated bowel ([place]) 02/27/2015  . Depression 12/27/2014  . Multiple sclerosis ([place]) 12/27/2014  . Migraine 12/27/2014  . Seizure disorder ([place]) 12/27/2014  . Smoker 12/27/2014  . Obesity 12/27/2014  . DDD (degenerative disc disease), cervical 07/28/2014  . Major depressive disorder, single episode 11/02/2013  . Laryngopharyngeal reflux 10/25/2010  . Tonsillitis 10/25/2010    Past Surgical History:  Procedure Laterality Date  . ABDOMINAL SURGERY    . APPENDECTOMY    . BOWEL RESECTION  01/2007   with colostomy  . CESAREAN SECTION N/A 07/12/2015   Procedure: CESAREAN SECTION;  Surgeon: Guss Bunde, MD;  Location: [place];  Service: Obstetrics;  Laterality:  N/A;  . COLONOSCOPY WITH PROPOFOL N/A 01/29/2017   Procedure: COLONOSCOPY WITH PROPOFOL;  Surgeon: Ileana Roup, MD;  Location: [facility];  Service: General;  Laterality: N/A;  . COLOSTOMY CLOSURE  04/2007  . EVALUATION UNDER ANESTHESIA WITH HEMORRHOIDECTOMY N/A 06/11/2018   Procedure: ANORECTAL EXAM UNDER ANESTHESIA WITH HEMORRHOIDECTOMY;  Surgeon: Ileana Roup, MD;  Location: [place];  Service: General;  Laterality: N/A;  . EXCISIONAL HEMORRHOIDECTOMY    . EXTREMITY CYST EXCISION  1994   right leg  . HERNIA REPAIR    . INCISIONAL HERNIA REPAIR N/A 02/16/2016   Procedure: HERNIA REPAIR INCISIONAL;  Surgeon: Fanny Skates, MD;  Location: [facility];  Service: General;  Laterality: N/A;  . INSERTION OF MESH  02/16/2016   Procedure: INSERTION OF MESH;  Surgeon: Fanny Skates, MD;  Location: [facility];  Service: General;;  . RADIOLOGY WITH ANESTHESIA N/A 03/06/2017   Procedure: MRI WITH ANESTHESIA OF CERVICAL SPINE WITHOUT CONTRAST, MRI OF LUMBAR SPINE WITHOUT CONTRAST;  Surgeon: Radiologist, Medication, MD;  Location: [place];  Service: Radiology;  Laterality: N/A;  . RADIOLOGY WITH ANESTHESIA N/A 09/15/2018   Procedure: MRI OF BRAIN WITH AND WITHOUT CONTRAST;  Surgeon: Radiologist, Medication, MD;  Location: [facility]  [facility];  Service: Radiology;  Laterality: N/A;  . RADIOLOGY WITH ANESTHESIA N/A 10/13/2018   Procedure: MRI OF BRAIN WITH AND WITHOUT CONTRAST;  Surgeon: Radiologist, Medication, MD;  Location: [place];  Service: Radiology;  Laterality: N/A;  . RADIOLOGY WITH ANESTHESIA N/A 10/26/2018   Procedure: MRI UNDER ANESTHESIA; HEAD, CERVICAL AND THORASCIC SPINE;  Surgeon: Radiologist, Medication, MD;  Location: [place];  Service: Radiology;  Laterality: N/A;  . SCAR REVISION  01/21/2011   Procedure: SCAR REVISION;  Surgeon: Hermelinda Dellen;  Location: [place];  Service: Plastics;  Laterality: N/A;  exploration of scar of abdomen and repair of defect  . TOOTH  EXTRACTION Left 10/2016  . TRANSRECTAL DRAINAGE OF PELVIC ABSCESS       OB History    Gravida  1   Para  1   Term      Preterm  1   AB      Living  1     SAB      TAB      Ectopic      Multiple  0   Live Births  1            Home Medications    Prior to Admission medications   Medication Sig Start Date End Date Taking? Authorizing Provider  amLODipine (NORVASC) 5 MG tablet Take 5 mg by mouth every morning.  01/23/16  Yes [provider]  atomoxetine (STRATTERA) 40 MG capsule TAKE 1 CAPSULE BY MOUTH EVERY DAY 11/05/18  Yes Charlcie Cradle, MD  busPIRone (BUSPAR) 15 MG tablet Take 1 tablet (15 mg total) by mouth 3 (three) times daily. 09/17/18  Yes Charlcie Cradle, MD  diazepam (VALIUM) 5 MG tablet Take 1 tablet (5 mg total) by mouth daily as needed for anxiety. 10/29/18 10/29/19 Yes Charlcie Cradle, MD  divalproex (DEPAKOTE) 500 MG DR tablet Take 1 tablet (500 mg total) by mouth at bedtime. 05/05/18  Yes Cameron Sprang, MD  dronabinol (MARINOL) 2.5 MG capsule Take 1 capsule (2.5 mg total) by mouth 2 (two) times daily before lunch and supper. 10/14/18  Yes Carlis Stable, NP  etonogestrel (NEXPLANON) 68 MG IMPL implant 1 each by Subdermal route once.   Yes [provider]  Fingolimod HCl (GILENYA) 0.5 MG CAPS Take 1 capsule (0.5 mg total) by mouth daily. Patient taking differently: Take 0.5 mg by mouth every morning.  05/05/18  Yes Cameron Sprang, MD  FLUoxetine (PROZAC) 40 MG capsule Take 2 capsules (80 mg total) by mouth every morning. 09/17/18 09/17/19 Yes Charlcie Cradle, MD  haloperidol (HALDOL) 1 MG tablet Take 1 tablet (1 mg total) by mouth 2 (two) times daily. 11/17/18  Yes Charlcie Cradle, MD  lithium carbonate (LITHOBID) 300 MG CR tablet Take 2 tablets (600 mg total) by mouth 2 (two) times daily. 09/17/18  Yes Charlcie Cradle, MD  loratadine (CLARITIN) 10 MG tablet Take 10 mg by mouth daily as needed for allergies.   Yes [provider]   methocarbamol (ROBAXIN) 750 MG tablet Take 1 tablet (750 mg total) by mouth 2 (two) times daily as needed for muscle spasms. 09/21/18 03/20/19 Yes Milinda Pointer, MD  Multiple Vitamins-Minerals (MULTIVITAMIN ADULT PO) Take 1 tablet by mouth daily.    Yes [provider]  omeprazole (PRILOSEC) 20 MG capsule TAKE 1 CAPSULE (20 MG TOTAL) BY MOUTH 2 (TWO) TIMES DAILY BEFORE A MEAL. 06/18/18  Yes Annitta Needs, NP  pantoprazole (PROTONIX) 40 MG tablet Take 40  mg by mouth daily. 11/09/18  Yes [provider]  pregabalin (LYRICA) 300 MG capsule Take 1 capsule (300 mg total) by mouth at bedtime. 09/21/18 03/20/19 Yes Milinda Pointer, MD  sulfamethoxazole-trimethoprim (BACTRIM DS) 800-160 MG tablet Take 1 tablet by mouth 2 (two) times daily for 10 days. 11/12/18 11/22/18 Yes Tacy Learn, PA-C  topiramate (TOPAMAX) 100 MG tablet Take 1.5 tablets twice a day Patient taking differently: Take 150 mg by mouth 2 (two) times daily. Take 1.5 tablets twice a day 05/05/18  Yes Cameron Sprang, MD  AIMOVIG 140 MG/ML SOAJ Inject 140 mg into the muscle every 30 (thirty) days.  08/21/18   [provider]  albuterol (PROVENTIL HFA;VENTOLIN HFA) 108 (90 Base) MCG/ACT inhaler Inhale 2 puffs into the lungs every 6 (six) hours as needed for wheezing or shortness of breath. Patient taking differently: Inhale 2 puffs into the lungs every 4 (four) hours as needed for wheezing or shortness of breath.  02/15/15   Jaclyn Prime, Collene Leyden, PA-C  cyclobenzaprine (FLEXERIL) 10 MG tablet Take 1 tablet (10 mg total) by mouth 3 (three) times daily as needed for muscle spasms. 11/22/18   Milton Ferguson, MD  EPINEPHrine (EPIPEN) 0.3 mg/0.3 mL SOAJ injection Inject 0.3 mg into the muscle as needed (allergic reaction). Reported on 03/28/2015    [provider]  magic mouthwash w/lidocaine SOLN Take 30 mLs by mouth 3 (three) times daily as needed for mouth pain. Swish/ hold/ spit three times a day as needed  10/28/18   Cameron Sprang, MD  NON FORMULARY Take 1 each by mouth at bedtime. CBD Gummy    [provider]  predniSONE (DELTASONE) 10 MG tablet Take 2 tablets (20 mg total) by mouth daily. 11/22/18   Milton Ferguson, MD    Family History Family History  Problem Relation Age of Onset  . Diabetes Mother   . Hypertension Mother   . Diabetes Father   . Hypertension Father   . Arthritis Father   . Cancer Maternal Grandmother   . Cancer Maternal Grandfather   . Alcohol abuse Neg Hx   . Anxiety disorder Neg Hx   . Bipolar disorder Neg Hx   . Drug abuse Neg Hx   . Depression Neg Hx   . Colon cancer Neg Hx     Social History Social History   Tobacco Use  . Smoking status: Former Smoker    Packs/day: 0.25    Years: 10.00    Pack years: 2.50    Types: Cigarettes    Quit date: 07/12/2015    Years since quitting: 3.3  . Smokeless tobacco: Never Used  Substance Use Topics  . Alcohol use: No    Alcohol/week: 0.0 standard drinks    Comment: Rare  . Drug use: No    Comment: marinol shows up as THC     Allergies   Baclofen, Duloxetine hcl, Gabapentin, Monosodium glutamate, Other, Alprazolam, Amitriptyline, Magnesium salicylate, Modafinil, Rizatriptan, Tizanidine, Vicodin [hydrocodone-acetaminophen], Acetaminophen, Adhesive [tape], Hydrocodone-acetaminophen, Ketorolac tromethamine, Lamotrigine, Nsaids, and Tramadol   Review of Systems Review of Systems  Constitutional: Negative for appetite change and fatigue.  HENT: Negative for congestion, ear discharge and sinus pressure.   Eyes: Negative for discharge.  Respiratory: Negative for cough.   Cardiovascular: Negative for chest pain.  Gastrointestinal: Negative for abdominal pain and diarrhea.  Genitourinary: Negative for frequency and hematuria.  Musculoskeletal: Positive for back pain.  Skin: Negative for rash.  Neurological: Negative for seizures and headaches.  Psychiatric/Behavioral: Negative for hallucinations.      Physical Exam Updated Vital Signs BP (!) 142/94   Pulse 82   Temp 98.7 F (37.1 C) (Oral)   SpO2 100%   Physical Exam Vitals signs and nursing note reviewed.  Constitutional:      Appearance: She is well-developed.  HENT:     Head: Normocephalic.     Nose: Nose normal.  Eyes:     General: No scleral icterus.    Conjunctiva/sclera: Conjunctivae normal.  Neck:     Musculoskeletal: Neck supple.     Thyroid: No thyromegaly.  Cardiovascular:     Rate and Rhythm: Normal rate and regular rhythm.     Heart sounds: No murmur. No friction rub. No gallop.   Pulmonary:     Breath sounds: No stridor. No wheezing or rales.  Chest:     Chest wall: No tenderness.  Abdominal:     General: There is no distension.     Tenderness: There is no abdominal tenderness. There is no rebound.  Musculoskeletal: Normal range of motion.     Comments: Moderate lumbar spine tenderness with positive straight leg raise on the left  Lymphadenopathy:     Cervical: No cervical adenopathy.  Skin:    Findings: No erythema or rash.  Neurological:     Mental Status: She is oriented to person, place, and time.     Motor: No abnormal muscle tone.     Coordination: Coordination normal.  Psychiatric:        Behavior: Behavior normal.      ED Treatments / Results  Labs (all labs ordered are listed, but only abnormal results are displayed) Labs Reviewed - No data to display  EKG None  Radiology No results found.  Procedures Procedures (including critical care time)  Medications Ordered in ED Medications  HYDROmorphone (DILAUDID) injection 1 mg (1 mg Intravenous Given 11/22/18 1722)  LORazepam (ATIVAN) injection 0.25 mg (0.25 mg Intravenous Given 11/22/18 1724)  methylPREDNISolone sodium succinate (SOLU-MEDROL) 125 mg/2 mL injection 125 mg (125 mg Intravenous Given 11/22/18 1723)     Initial Impression / Assessment and Plan / ED Course  I have reviewed the triage vital signs and the nursing  notes.  Pertinent labs & imaging results that were available during my care of the patient were reviewed by me and considered in my medical decision making (see chart for details).        Patient with sciatica.  Patient will be discharged home with prednisone and Flexeril.  She improved with steroids and pain medicine in the emergency department  Final Clinical Impressions(s) / ED Diagnoses   Final diagnoses:  Low back pain with sciatica, sciatica laterality unspecified, unspecified back pain laterality, unspecified chronicity    ED Discharge Orders         Ordered    predniSONE (DELTASONE) 10 MG tablet  Daily     11/22/18 1835    cyclobenzaprine (FLEXERIL) 10 MG tablet  3 times daily PRN     11/22/18 Colon Flattery, MD 11/22/18 1840

## 2018-11-22 NOTE — ED Triage Notes (Signed)
Pt brought in by EMS due to chronic back pain due to MS. Her DR will not give her an injection due to her wound. Family reported to EMS that pt just wants fentanyl injection

## 2018-11-22 NOTE — ED Triage Notes (Signed)
Pt brought in by [facility] with lower back pain and pt is hunched over and states she cannot straighten her back.

## 2018-11-22 NOTE — Discharge Instructions (Addendum)
Follow-up with your doctor this week for recheck. 

## 2018-11-23 ENCOUNTER — Telehealth: Payer: Self-pay | Admitting: *Deleted

## 2018-11-23 ENCOUNTER — Ambulatory Visit: Payer: Medicare Other | Admitting: Psychology

## 2018-11-23 ENCOUNTER — Telehealth: Payer: Self-pay | Admitting: Internal Medicine

## 2018-11-23 NOTE — Telephone Encounter (Signed)
LM to return call.

## 2018-11-23 NOTE — Telephone Encounter (Signed)
We need to arrange an office visit for patient to follow-up. She was supposed to have GES. Consideration for EGD possibly. Would like to have her evaluated before adjusting dosage as she did not complete GES.

## 2018-11-23 NOTE — Telephone Encounter (Signed)
Patient called and states that she would like to know if Dr Dossie Arbour would give her injections or medication.  She has been frequenting the ED and getting Ativan, Solumedrol and Dilaudid at each visit.  States they are having to put her back into position.  States that she goes to the ED either with her body in a L shape or a S shape.  States they put her back into place like you do if your shoulder is out of place.  Patient states that she still has an open wound that is oozing and that she has an infection and will be going back to surgery later in the month to have the mess removed and be left open to heal from the inside out.  States the ED told her that she could not keep coming there for this chromic pain condition and she needed to discuss this with her pain management doctor.  Informed patient that she would not be getting injections with an open draining wound.  INformed patient that I would send the message to Dr Dossie Arbour and schedule her a virtual visit.

## 2018-11-23 NOTE — Telephone Encounter (Signed)
Spoke with pt. She wasn't aware that she missed her apt to have the GES. Pt  isn't sure if she can have the procedures until after her surgery. The wound is open and draining on the pubic area. Pt states Dr. Dalbert Batman, will be removing the hernia mesh and is treating the infection/Ecoli. . Pt states the smell and Ecoli makes her nauseated.

## 2018-11-23 NOTE — Telephone Encounter (Signed)
Returned pt's call. Pt states that she is taking Marinol 2.5 mg tid and would like to increase it to four times daily. Pt is having constant nausea and states that she has a wound that is being treated on her pubic area. Pt stated that she is going to have surgery to fix the problem. Pt says that the area has some Ecoli and it's making her very nauseated. If medication is approved for 4 times a day, pt will need a new RX.

## 2018-11-23 NOTE — Telephone Encounter (Signed)
[place] , SHE HAS QUESTIONS ABOUT MEDICATIONS

## 2018-11-24 NOTE — Telephone Encounter (Signed)
Could we do a virtual visit?

## 2018-11-24 NOTE — Telephone Encounter (Signed)
Pt accepted a virtual appointment next week 12/01/2018 @ 9:00.

## 2018-11-26 ENCOUNTER — Other Ambulatory Visit: Payer: Self-pay

## 2018-11-26 ENCOUNTER — Encounter: Payer: Self-pay | Admitting: Pain Medicine

## 2018-11-26 ENCOUNTER — Ambulatory Visit (INDEPENDENT_AMBULATORY_CARE_PROVIDER_SITE_OTHER): Payer: Medicare Other | Admitting: Psychiatry

## 2018-11-26 ENCOUNTER — Encounter (HOSPITAL_COMMUNITY): Payer: Self-pay | Admitting: Psychiatry

## 2018-11-26 DIAGNOSIS — F445 Conversion disorder with seizures or convulsions: Secondary | ICD-10-CM

## 2018-11-26 DIAGNOSIS — G43611 Persistent migraine aura with cerebral infarction, intractable, with status migrainosus: Secondary | ICD-10-CM | POA: Diagnosis not present

## 2018-11-26 DIAGNOSIS — M549 Dorsalgia, unspecified: Secondary | ICD-10-CM | POA: Diagnosis not present

## 2018-11-26 DIAGNOSIS — I639 Cerebral infarction, unspecified: Secondary | ICD-10-CM | POA: Diagnosis not present

## 2018-11-26 DIAGNOSIS — R319 Hematuria, unspecified: Secondary | ICD-10-CM | POA: Diagnosis not present

## 2018-11-26 DIAGNOSIS — F900 Attention-deficit hyperactivity disorder, predominantly inattentive type: Secondary | ICD-10-CM

## 2018-11-26 DIAGNOSIS — F411 Generalized anxiety disorder: Secondary | ICD-10-CM

## 2018-11-26 DIAGNOSIS — N200 Calculus of kidney: Secondary | ICD-10-CM | POA: Diagnosis not present

## 2018-11-26 DIAGNOSIS — F332 Major depressive disorder, recurrent severe without psychotic features: Secondary | ICD-10-CM

## 2018-11-26 NOTE — Telephone Encounter (Signed)
Patient called back and is scheduled for VV

## 2018-11-26 NOTE — Telephone Encounter (Signed)
Tried to call patient, no answer. lvmail asking patient to call for VV w/ Dr. Dossie Arbour per instruction from [facility]

## 2018-11-26 NOTE — Progress Notes (Signed)
Virtual Visit via Telephone Note  I connected with [place]  on 11/26/18 at  9:15 AM EST by telephone and verified that I am speaking with the correct person using two identifiers.  Location: Patient: home Provider: office   I discussed the limitations, risks, security and privacy concerns of performing an evaluation and management service by telephone and the availability of in person appointments. I also discussed with the patient that there may be a patient responsible charge related to this service. The patient expressed understanding and agreed to proceed.   History of Present Illness: Pt is asking for treatment with a stimulant. She states that her family is threatening to kick her out and take her baby if she does not get better. They want her to cook, clean and take care of the baby. Pt states she does the best she can but her pain from MS limits how much she can do. She is always attentive to her child and thinks she is a good mom. Pt is depressed. She is worried that she will seperated from her child and will be homeless. She thinks that a stimulant would give her energy to do the things her parents are asking of her.   Pt admits she is depressed but denies any SI/HI. She has gone to the ED numerous times over the last month for pseudoseizures and pain. She states that everyone thinks she is a drug seeker but she just wants help. The Strattera seems to help improve her energy a little but not enough.     Observations/Objective: I spoke with [place] L Frech on the phone.  Pt was calm and cooperative.  Pt was engaged in the conversation. Speech was clear and coherent with normal rate, tone and volume.  Mood is depressed and anxious, affect is congruent. Thought processes are coherent and circumstantial. Thought content is with ruminations.  Pt denies SI/HI.   Pt denies auditory and visual hallucinations and did not appear to be responding to internal stimuli.  Memory and  concentration are good.  Fund of knowledge and use of language are average.  Insight and judgment are poor.   I am unable to comment on psychomotor activity, general appearance, hygiene, or eye contact as I was unable to physically see the patient on the phone.  Vital signs not available since interview conducted virtually.    I reviewed the information below on 11/26/2018 and have updated it Assessment and Plan: GAD; Conversion d/o with pseudoseizures; MDD-recurrent, moderate; PTSD; Insomnia; ADHD-inattentive type; Cluster B personality traits.   Status of current symptoms: worsening depression  1. Haldol 1mg  po BID 2. Lithium CR 1200mg  po qD 3. Prozac 80mg  po qD 4. Buspar 15mg  po TID 5. Valium 5mg  po qD prn anxiety- plan taper off due to concerns about over sedation 6. Strattera 40mg  po qD for ADHD.   Will avoid stimulants due to ongoing anxiety and concerns that Jenisse maybe abusing medications.    Topamax and Depakote prescribed by her neurologist   On review of her meds we discussed how she is on large number of sedating meds and how that can contribute to fatigue. She asked if the Valium could be changed to Ativan. She has received Ativan several times in the ED and it helps. I declined the request. My goal is stop all benzos and limit sedatives as much as possible.   I reviewed her recent visits to the ED. She has been going every 2-3 weeks on average for  pain and seizures. Each time she asks for narcotics or benzos.    Her sister continues to do therapy with the patient. Her sister lives in [place] and will not allow [place] to live with her.      Follow Up Instructions: In 2 weeks or sooner if needed   I discussed the assessment and treatment plan with the patient. The patient was provided an opportunity to ask questions and all were answered. The patient agreed with the plan and demonstrated an understanding of the instructions.   The patient was advised to call back  or seek an in-person evaluation if the symptoms worsen or if the condition fails to improve as anticipated.  I provided 30 minutes of non-face-to-face time during this encounter.   Charlcie Cradle, MD

## 2018-11-29 NOTE — Progress Notes (Signed)
Pain Management Virtual Encounter Note - Virtual Visit via Telephone Telehealth (real-time audio visits between healthcare provider and patient).   Patient's Phone No. & Preferred Pharmacy:  [phone] (home); [phone] (mobile); (Preferred) [phone] [email or web address]  [facility][id number] N[facility], [place] [address] [place] [address] Phone: [phone] Fax: [phone]    Pre-screening note:  Our staff contacted Emily [name] and offered her an "in person", "face-to-face" appointment versus a telephone encounter. She indicated preferring the telephone encounter, at this time.   Reason for Virtual Visit: COVID-19*  Social distancing based on CDC and AMA recommendations.   I contacted Emily [name] on 11/30/2018 via telephone.      I clearly identified myself as Gaspar Cola, MD. I verified that I was speaking with the correct person using two identifiers (Name: Emily [name], and date of birth: December 23, 1980).  Advanced Informed Consent I sought verbal advanced consent from Emily [name] for virtual visit interactions. I informed Emily [name] of possible security and privacy concerns, risks, and limitations associated with providing "not-in-person" medical evaluation and management services. I also informed Emily [name] of the availability of "in-person" appointments. Finally, I informed her that there would be a charge for the virtual visit and that she could be  personally, fully or partially, financially responsible for it. Emily [name] expressed understanding and agreed to proceed.   Historic Elements   Emily [name] is a 38 y.o. year old, female patient evaluated today after her last encounter by our practice on 11/23/2018. Emily [name]  has a past medical history of ADHD, Anxiety, Asthma (as a child), Chronic back pain, Chronic pain, Conversion disorder, DDD (degenerative disc disease), cervical, Depression,  External hemorrhoid, Fatty liver, GERD (gastroesophageal reflux disease), History of kidney stones, HTN (hypertension) (02/27/2015), JC virus antibody positive, Migraines, MS (multiple sclerosis) ([place]), Neuropathy, OA (osteoarthritis), Ovarian cyst, Panic attack, Perforated bowel ([place]) (2009), Pseudoseizures, PTSD (post-traumatic stress disorder), S/P emergency C-section, Seasonal allergies, Urinary urgency, Vertigo, Wears contact lenses, and Wears glasses. 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 Emily [name] has a current medication list which includes the following prescription(s): aimovig, albuterol, amlodipine, atomoxetine, buspirone, diazepam, divalproex, dronabinol, epinephrine, etonogestrel, fingolimod hcl, fluoxetine, haloperidol, lithium carbonate, loratadine, methocarbamol, multiple vitamins-minerals, NON FORMULARY, omeprazole, pantoprazole, prednisone, pregabalin, and topiramate, and the following Facility-Administered Medications: metoclopramide. She  reports that she quit smoking about 3 years ago. Her smoking use included cigarettes. She has a 2.50 pack-year smoking history. She has never used smokeless tobacco. She reports that she does not drink alcohol or use drugs. Emily [name] is allergic to baclofen; duloxetine hcl; gabapentin; monosodium glutamate;  other; alprazolam; amitriptyline; magnesium salicylate; modafinil; rizatriptan; tizanidine; vicodin [hydrocodone-acetaminophen]; acetaminophen; adhesive [tape]; hydrocodone-acetaminophen; ketorolac tromethamine; lamotrigine; nsaids; and tramadol.   HPI  Today, she is being contacted for follow-up evaluation.  The patient indicates that she has been having problems getting treatment for her low back issues in the hospital due to the fact that it is chronic.  The fact of the matter is that unfortunately she seems to have burned some bridges due to the inconsistent stories that she has provided [place] in the past.  In any case, she is still having an infection in her abdominal wound and therefore we will not be doing any elective procedures until she fully recovers from this.  We have provided her with enough medication to last until March 2021.  The only other thing that I can see to do for her is to send her to physical therapy for a TENS unit.  Today I put an order for this.  I have shared this plan with the patient and she is in agreement with it.  Today again, she told me that she went to the emergency room and they would not treat her and eventually they broke down and gave her some steroids.  However when I checked her PMP, I can see that she recently had some hydrocodone/APAP 5/325 filled on 11/26/2018, just 4 days ago.  Pharmacotherapy Assessment  Analgesic: None from our practice due to benzodiazepine use and high risk.   Monitoring: Pharmacotherapy: No side-effects or adverse reactions reported. [place] PMP: PDMP reviewed during this encounter.       Compliance: No problems identified. Effectiveness: Clinically acceptable. Plan: Refer to "POC".  UDS:  Summary  Date Value Ref Range Status  03/16/2018 FINAL  Final    Comment:    ==================================================================== TOX[facility]RE COMP DRUG  ANALYSIS,UR ==================================================================== Test                             Result       Flag       Units Drug Present and Declared for Prescription Verification   Desmethyldiazepam              214          EXPECTED   ng/mg creat   Oxazepam                       1241         EXPECTED   ng/mg creat   Temazepam                      1052         EXPECTED   ng/mg creat    Desmethyldiazepam, oxazepam, and temazepam are expected    metabolites of diazepam. Desmethyldiazepam and oxazepam are also    expected metabolites of other drugs, including chlordiazepoxide,    prazepam, clorazepate, and halazepam. Oxazepam is an expected    metabolite of temazepam. Oxazepam and temazepam are also    available as scheduled prescription medications.   Pregabalin                     PRESENT      EXPECTED   Topiramate                     PRESENT      EXPECTED   Methocarbamol                  PRESENT      EXPECTED   Fluoxetine                     PRESENT      EXPECTED   Norfluoxetine  PRESENT      EXPECTED    Norfluoxetine is an expected metabolite of fluoxetine. Drug Absent but Declared for Prescription Verification   Prochlorperazine               Not Detected UNEXPECTED   Ibuprofen                      Not Detected UNEXPECTED    Ibuprofen, as indicated in the declared medication list, is not    always detected even when used as directed. ==================================================================== Test                      Result    Flag   Units      Ref Range   Creatinine              29               mg/dL      >=20 ==================================================================== Declared Medications:  The flagging and interpretation on this report are based on the  following declared medications.  Unexpected results may arise from  inaccuracies in the declared medications.  **Note: The testing scope of this panel includes these  medications:  Diazepam (Valium)  Fluoxetine (Prozac)  Methocarbamol (Robaxin)  Pregabalin (Lyrica)  Prochlorperazine (Compazine)  Topiramate (Topamax)  **Note: The testing scope of this panel does not include small to  moderate amounts of these reported medications:  Ibuprofen  **Note: The testing scope of this panel does not include following  reported medications:  Albuterol  Amlodipine (Norvasc)  Buspirone (BuSpar)  Divalproex (Depakote)  Epinephrine (EpiPen)  Etonogestrel (Nexplanon)  Fingolimod (Gilenya)  Lithium (Lithobid)  Loratadine (Claritin)  Multivitamin  Ranitidine (Zantac) ==================================================================== For clinical consultation, please call [phone]. ====================================================================    Laboratory Chemistry Profile (12 mo)  Renal: 08/13/2018: BUN/Creatinine Ratio 12 11/12/2018: BUN 11; Creatinine, Ser 0.79  Lab Results  Component Value Date   GFRAA >60 11/12/2018   GFRNONAA >60 11/12/2018   Hepatic: 11/12/2018: Albumin 3.9 Lab Results  Component Value Date   AST 19 11/12/2018   ALT 22 11/12/2018   Other: 03/16/2018: 25-Hydroxy, Vitamin D 20; 25-Hydroxy, Vitamin D-2 4.7; 25-Hydroxy, Vitamin D-3 15; CRP 6; Sed Rate 20 10/26/2018: Vitamin B-12 274 Note: Above Lab results reviewed.  Imaging  Last 90 days:  Dg Shoulder Right  Result Date: 09/13/2018 CLINICAL DATA:  Pt has had multiple falls recently and has also been seen at [facility]: RIGHT SHOULDER - 2+ VIEW COMPARISON:  09/11/2018 FINDINGS: There is no evidence of fracture or dislocation. There is no evidence of arthropathy or other focal bone abnormality. Soft tissues are unremarkable. IMPRESSION: Negative. Electronically Signed   By: Nolon Nations M.D.   On: 09/13/2018 15:01   Dg Elbow Complete Right  Result Date: 09/13/2018 CLINICAL DATA:  Pt has had multiple falls recently and has also been seen at [facility] at  right knee anterior, bruise at anterior right shoulder, pain right elbow and right hip EXAM: RIGHT ELBOW - COMPLETE 3+ VIEW COMPARISON:  05/12/2008 FINDINGS: There is no evidence of fracture, dislocation, or joint effusion. There is no evidence of arthropathy or other focal bone abnormality. Soft tissues are unremarkable. IMPRESSION: Negative. Electronically Signed   By: Nolon Nations M.D.   On: 09/13/2018 15:02   Ct Head Wo Contrast  Result Date: 09/13/2018 CLINICAL DATA:  Status post fall today.  Initial encounter. EXAM: CT HEAD WITHOUT CONTRAST CT CERVICAL SPINE WITHOUT CONTRAST  TECHNIQUE: Multidetector CT imaging of the head and cervical spine was performed following the standard protocol without intravenous contrast. Multiplanar CT image reconstructions of the cervical spine were also generated. COMPARISON:  Head CT scan 09/11/2018. Brain MRI 06/04/2017. MRI cervical spine 03/18/2014. FINDINGS: CT HEAD FINDINGS Brain: No evidence of acute infarction, hemorrhage, hydrocephalus, extra-axial collection or mass lesion/mass effect. Vascular: No hyperdense vessel or unexpected calcification. Skull: Intact.  No focal lesion. Sinuses/Orbits: Small mucous retention cyst or polyp inferior aspect of the right maxillary sinus. Otherwise negative. Other: None. CT CERVICAL SPINE FINDINGS Alignment: Maintained with straightening of lordosis noted, unchanged. Skull base and vertebrae: No acute fracture. No primary bone lesion or focal pathologic process. Soft tissues and spinal canal: No prevertebral fluid or swelling. No visible canal hematoma. Disc levels: Large disc bulges with endplate spurring are seen at C5-6 and C6-7. The appearance is not grossly changed compared to the prior MRI. Upper chest: Negative. Other: None. IMPRESSION: No acute abnormality head or cervical spine. Chronic degenerative disc disease C5-6 and C6-7. Electronically Signed   By: Inge Rise M.D.   On: 09/13/2018 14:55   Ct Cervical Spine  Wo Contrast  Result Date: 09/13/2018 CLINICAL DATA:  Status post fall today.  Initial encounter. EXAM: CT HEAD WITHOUT CONTRAST CT CERVICAL SPINE WITHOUT CONTRAST TECHNIQUE: Multidetector CT imaging of the head and cervical spine was performed following the standard protocol without intravenous contrast. Multiplanar CT image reconstructions of the cervical spine were also generated. COMPARISON:  Head CT scan 09/11/2018. Brain MRI 06/04/2017. MRI cervical spine 03/18/2014. FINDINGS: CT HEAD FINDINGS Brain: No evidence of acute infarction, hemorrhage, hydrocephalus, extra-axial collection or mass lesion/mass effect. Vascular: No hyperdense vessel or unexpected calcification. Skull: Intact.  No focal lesion. Sinuses/Orbits: Small mucous retention cyst or polyp inferior aspect of the right maxillary sinus. Otherwise negative. Other: None. CT CERVICAL SPINE FINDINGS Alignment: Maintained with straightening of lordosis noted, unchanged. Skull base and vertebrae: No acute fracture. No primary bone lesion or focal pathologic process. Soft tissues and spinal canal: No prevertebral fluid or swelling. No visible canal hematoma. Disc levels: Large disc bulges with endplate spurring are seen at C5-6 and C6-7. The appearance is not grossly changed compared to the prior MRI. Upper chest: Negative. Other: None. IMPRESSION: No acute abnormality head or cervical spine. Chronic degenerative disc disease C5-6 and C6-7. Electronically Signed   By: Inge Rise M.D.   On: 09/13/2018 14:55   Mr Jeri Cos And Wo Contrast  Result Date: 10/26/2018 CLINICAL DATA:  Initial evaluation for multiple sclerosis, new neurologic event. EXAM: MRI HEAD WITHOUT AND WITH CONTRAST MRI CERVICAL SPINE WITHOUT AND WITH CONTRAST MRI THORACIC SPINE WITHOUT AND WITH CONTRAST TECHNIQUE: Multiplanar, multiecho pulse sequences of the brain and surrounding structures, and cervical spine, to include the craniocervical junction and cervicothoracic junction,  as well as the thoracic spine were obtained without and with intravenous contrast. CONTRAST:  71mL GADAVIST GADOBUTROL 1 MMOL/ML IV SOLN COMPARISON:  Recent brain MRI from 10/13/2018 as well as previous cervical spine MRI from 03/06/2017. FINDINGS: MRI HEAD FINDINGS Brain: Cerebral volume stable, and remains within normal limits for age. No abnormal foci of restricted diffusion to suggest acute or subacute infarct. Gray-white matter differentiation maintained without evidence for chronic cortical infarction. No acute intracranial hemorrhage. Single subcentimeter focus of susceptibility artifact noted within the posterior right frontal region, likely a small chronic microhemorrhage, of doubtful significance in isolation. Again seen are multiple scattered foci of T2/FLAIR hyperintensity involving the supratentorial cerebral white matter  bilaterally, with involvement of the periventricular, deep, and juxta cortical white matter. No significant involvement of the brainstem or cerebellum. Overall, appearance is relatively stable from previous, without evidence for significant disease progression. No associated restricted diffusion or enhancement to suggest active demyelination. Pituitary gland suprasellar region normal. Midline structures intact. Vascular: Major intracranial vascular flow voids are maintained. Skull and upper cervical spine: Craniocervical junction within normal limits. Bone marrow signal intensity normal. No scalp soft tissue abnormality. Sinuses/Orbits: Globes orbital soft tissues within normal limits. Mild scattered mucosal thickening noted within the ethmoidal air cells and maxillary sinuses. Paranasal sinuses are otherwise clear. Trace fluid signal intensity within the mastoid air cells bilaterally, of doubtful significance. Inner ear structures grossly normal. Other: None. MRI CERVICAL SPINE FINDINGS Alignment: Chronic straightening of the normal cervical lordosis, stable. No listhesis or  subluxation. Vertebrae: Vertebral body height maintained without evidence for acute or interval fracture. Bone marrow signal intensity within normal limits. No discrete or worrisome osseous lesions. Progressive degenerative reactive endplate changes about the C5-6 and C6-7 interspace. No other abnormal marrow edema or enhancement. Cord: Subtle patchy T2/stir signal abnormality within the cervical spinal cord at the level of C2, C3, and C4 is not significantly changed as compared to 2019. No abnormal enhancement to suggest active demyelination. No evidence for significant disease progression. Posterior Fossa, vertebral arteries, paraspinal tissues: Craniocervical junction within normal limits. Paraspinous and prevertebral soft tissues normal. Normal intravascular flow voids seen within the vertebral arteries bilaterally. Disc levels: C2-C3: Moderate right with advanced left facet hypertrophy, stable from previous. Resultant mild left C3 foraminal narrowing. No significant canal or right neural foraminal stenosis. C3-C4: Moderate bilateral facet hypertrophy. No canal or foraminal stenosis. C4-C5: Shallow central disc protrusion mildly indents the ventral thecal sac. No significant spinal stenosis or cord deformity. Foramina remain patent. Appearance is unchanged. C5-C6: Chronic intervertebral disc space narrowing with diffuse degenerative disc osteophyte. Broad based left paracentral disc osteophyte complex flattens and effaces the ventral thecal sac, mildly flattening the left ventral cord. This appears similar to previous. Mild left C6 foraminal narrowing. No significant right foraminal encroachment. C6-C7: Chronic intervertebral disc space narrowing with diffuse circumferential disc osteophyte. Broad-based posterior component, asymmetric to the left flattens and largely effaces the ventral thecal sac. Secondary moderate spinal stenosis with flattening of the cervical spinal cord, greater on the left. No cord signal  changes. Moderate bilateral C7 foraminal stenosis is stable to mildly progressed. C7-T1: Mild bilateral facet hypertrophy. Minimal endplate spurring without disc bulge. No canal or foraminal stenosis. MRI THORACIC SPINE FINDINGS Alignment: Mild dextroscoliosis. Alignment otherwise normal with preservation of the normal thoracic kyphosis. No listhesis. Vertebrae: Vertebral body height maintained without evidence for acute or chronic fracture. Small endplate Schmorl's node at the superior endplate of [id number]. Underlying bone marrow signal intensity within normal limits. No discrete or worrisome osseous lesions. No abnormal marrow edema or enhancement. Cord: Question subtle patchy cord signal abnormality at the level of T6-7 (series 4, image 10). Additional possible subtle cord signal abnormality within the left hemi cord at the levels of T8-9 (series 7, image 25), and T9-10 (series 7, image 27). Findings consistent with underlying chronic demyelinating disease. No evidence for active demyelination. No other definite cord signal abnormality identified. Underlying cord caliber and morphology within normal limits. Conus terminates at approximately the level of L1. Paraspinal soft tissues: Paraspinous soft tissues demonstrate no acute finding. Partially visualized visceral structures unremarkable. Dependent atelectatic changes noted within the visualized lungs. Disc levels: Minimal disc bulging seen at  T11-12 and T12-L1 without significant stenosis. Mild posterior element hypertrophy at T9-10 through T12-L1. No significant canal or foraminal stenosis. No neural impingement. IMPRESSION: MRI HEAD IMPRESSION 1. Cerebral white matter disease compatible with history of multiple sclerosis, unchanged relative to previous exams. No evidence for active demyelination. 2. No other acute intracranial abnormality. MRI CERVICAL SPINE IMPRESSION 1. Subtle patchy cord signal abnormality at the levels of C2-C4, consistent with chronic  demyelinating disease. Overall, appearance is relatively stable as compared to 2019 without evidence for significant disease progression. No evidence for active demyelination. 2. Degenerative disc osteophyte at C6-7 with resultant moderate spinal stenosis and mild mass effect on the left greater than right spinal cord. Moderate bilateral C7 foraminal narrowing is mildly progressed from previous. 3. Advanced degenerative disc osteophyte at C5-6 with resultant mild spinal stenosis and mild mass effect on the left hemi cord, similar to previous. MRI THORACIC SPINE IMPRESSION 1. Question subtle patchy cord signal abnormality at the level of T6-7, T8-9, and T9-10 as above, suspicious for underlying chronic demyelinating disease. No evidence for active demyelination. 2. Mild degenerative spondylosis within the lower thoracic spine as above without significant canal or foraminal stenosis. No impingement. Electronically Signed   By: Jeannine Boga M.D.   On: 10/26/2018 21:45   Mr Jeri Cos [id number] Contrast  Result Date: 10/13/2018 CLINICAL DATA:  Multiple sclerosis. Feet numbness. EXAM: MRI HEAD WITHOUT AND WITH CONTRAST TECHNIQUE: Multiplanar, multiecho pulse sequences of the brain and surrounding structures were obtained without and with intravenous contrast. CONTRAST:  34mL GADAVIST GADOBUTROL 1 MMOL/ML IV SOLN COMPARISON:  Head CT 09/13/2018 and MRI 06/04/2017 FINDINGS: The study was performed under general anesthesia. Brain: No acute infarct, mass, midline shift, or extra-axial fluid collection is identified. The ventricles and sulci are normal. There is a single focus of chronic microhemorrhage in the posterolateral right frontal lobe, questionably present on the prior study's motion degraded susceptibility weighted imaging. There are numerous small foci of T2 hyperintensity in the cerebral white matter bilaterally with involvement of the periventricular, deep, and juxtacortical white matter. A greater number of  punctate lesions are evident on today's study which was free of motion artifact and included thinner slices than the previous study, however the larger lesions appear unchanged and no significant interval progression is suspected. There is mild involvement of the corpus callosum. No lesions demonstrate restricted diffusion or enhancement, and no lesions are seen in the posterior fossa. Vascular: Major intracranial vascular flow voids are preserved. Skull and upper cervical spine: No suspicious marrow lesion. Sinuses/Orbits: Unremarkable orbits. Paranasal sinuses and mastoid air cells are clear. Other: None. IMPRESSION: Unchanged cerebral white matter disease consistent with multiple sclerosis. No evidence of active demyelination. Electronically Signed   By: Logan Bores M.D.   On: 10/13/2018 16:13   Mr Cervical Spine W Or Wo Contrast  Result Date: 10/26/2018 CLINICAL DATA:  Initial evaluation for multiple sclerosis, new neurologic event. EXAM: MRI HEAD WITHOUT AND WITH CONTRAST MRI CERVICAL SPINE WITHOUT AND WITH CONTRAST MRI THORACIC SPINE WITHOUT AND WITH CONTRAST TECHNIQUE: Multiplanar, multiecho pulse sequences of the brain and surrounding structures, and cervical spine, to include the craniocervical junction and cervicothoracic junction, as well as the thoracic spine were obtained without and with intravenous contrast. CONTRAST:  71mL GADAVIST GADOBUTROL 1 MMOL/ML IV SOLN COMPARISON:  Recent brain MRI from 10/13/2018 as well as previous cervical spine MRI from 03/06/2017. FINDINGS: MRI HEAD FINDINGS Brain: Cerebral volume stable, and remains within normal limits for age. No abnormal foci of restricted  diffusion to suggest acute or subacute infarct. Gray-white matter differentiation maintained without evidence for chronic cortical infarction. No acute intracranial hemorrhage. Single subcentimeter focus of susceptibility artifact noted within the posterior right frontal region, likely a small chronic  microhemorrhage, of doubtful significance in isolation. Again seen are multiple scattered foci of T2/FLAIR hyperintensity involving the supratentorial cerebral white matter bilaterally, with involvement of the periventricular, deep, and juxta cortical white matter. No significant involvement of the brainstem or cerebellum. Overall, appearance is relatively stable from previous, without evidence for significant disease progression. No associated restricted diffusion or enhancement to suggest active demyelination. Pituitary gland suprasellar region normal. Midline structures intact. Vascular: Major intracranial vascular flow voids are maintained. Skull and upper cervical spine: Craniocervical junction within normal limits. Bone marrow signal intensity normal. No scalp soft tissue abnormality. Sinuses/Orbits: Globes orbital soft tissues within normal limits. Mild scattered mucosal thickening noted within the ethmoidal air cells and maxillary sinuses. Paranasal sinuses are otherwise clear. Trace fluid signal intensity within the mastoid air cells bilaterally, of doubtful significance. Inner ear structures grossly normal. Other: None. MRI CERVICAL SPINE FINDINGS Alignment: Chronic straightening of the normal cervical lordosis, stable. No listhesis or subluxation. Vertebrae: Vertebral body height maintained without evidence for acute or interval fracture. Bone marrow signal intensity within normal limits. No discrete or worrisome osseous lesions. Progressive degenerative reactive endplate changes about the C5-6 and C6-7 interspace. No other abnormal marrow edema or enhancement. Cord: Subtle patchy T2/stir signal abnormality within the cervical spinal cord at the level of C2, C3, and C4 is not significantly changed as compared to 2019. No abnormal enhancement to suggest active demyelination. No evidence for significant disease progression. Posterior Fossa, vertebral arteries, paraspinal tissues: Craniocervical junction  within normal limits. Paraspinous and prevertebral soft tissues normal. Normal intravascular flow voids seen within the vertebral arteries bilaterally. Disc levels: C2-C3: Moderate right with advanced left facet hypertrophy, stable from previous. Resultant mild left C3 foraminal narrowing. No significant canal or right neural foraminal stenosis. C3-C4: Moderate bilateral facet hypertrophy. No canal or foraminal stenosis. C4-C5: Shallow central disc protrusion mildly indents the ventral thecal sac. No significant spinal stenosis or cord deformity. Foramina remain patent. Appearance is unchanged. C5-C6: Chronic intervertebral disc space narrowing with diffuse degenerative disc osteophyte. Broad based left paracentral disc osteophyte complex flattens and effaces the ventral thecal sac, mildly flattening the left ventral cord. This appears similar to previous. Mild left C6 foraminal narrowing. No significant right foraminal encroachment. C6-C7: Chronic intervertebral disc space narrowing with diffuse circumferential disc osteophyte. Broad-based posterior component, asymmetric to the left flattens and largely effaces the ventral thecal sac. Secondary moderate spinal stenosis with flattening of the cervical spinal cord, greater on the left. No cord signal changes. Moderate bilateral C7 foraminal stenosis is stable to mildly progressed. C7-T1: Mild bilateral facet hypertrophy. Minimal endplate spurring without disc bulge. No canal or foraminal stenosis. MRI THORACIC SPINE FINDINGS Alignment: Mild dextroscoliosis. Alignment otherwise normal with preservation of the normal thoracic kyphosis. No listhesis. Vertebrae: Vertebral body height maintained without evidence for acute or chronic fracture. Small endplate Schmorl's node at the superior endplate of [id number]. Underlying bone marrow signal intensity within normal limits. No discrete or worrisome osseous lesions. No abnormal marrow edema or enhancement. Cord: Question subtle  patchy cord signal abnormality at the level of T6-7 (series 4, image 10). Additional possible subtle cord signal abnormality within the left hemi cord at the levels of T8-9 (series 7, image 25), and T9-10 (series 7, image 27). Findings consistent with underlying chronic  demyelinating disease. No evidence for active demyelination. No other definite cord signal abnormality identified. Underlying cord caliber and morphology within normal limits. Conus terminates at approximately the level of L1. Paraspinal soft tissues: Paraspinous soft tissues demonstrate no acute finding. Partially visualized visceral structures unremarkable. Dependent atelectatic changes noted within the visualized lungs. Disc levels: Minimal disc bulging seen at T11-12 and T12-L1 without significant stenosis. Mild posterior element hypertrophy at T9-10 through T12-L1. No significant canal or foraminal stenosis. No neural impingement. IMPRESSION: MRI HEAD IMPRESSION 1. Cerebral white matter disease compatible with history of multiple sclerosis, unchanged relative to previous exams. No evidence for active demyelination. 2. No other acute intracranial abnormality. MRI CERVICAL SPINE IMPRESSION 1. Subtle patchy cord signal abnormality at the levels of C2-C4, consistent with chronic demyelinating disease. Overall, appearance is relatively stable as compared to 2019 without evidence for significant disease progression. No evidence for active demyelination. 2. Degenerative disc osteophyte at C6-7 with resultant moderate spinal stenosis and mild mass effect on the left greater than right spinal cord. Moderate bilateral C7 foraminal narrowing is mildly progressed from previous. 3. Advanced degenerative disc osteophyte at C5-6 with resultant mild spinal stenosis and mild mass effect on the left hemi cord, similar to previous. MRI THORACIC SPINE IMPRESSION 1. Question subtle patchy cord signal abnormality at the level of T6-7, T8-9, and T9-10 as above,  suspicious for underlying chronic demyelinating disease. No evidence for active demyelination. 2. Mild degenerative spondylosis within the lower thoracic spine as above without significant canal or foraminal stenosis. No impingement. Electronically Signed   By: Jeannine Boga M.D.   On: 10/26/2018 21:45   Mr Thoracic Spine W Wo Contrast  Result Date: 10/26/2018 CLINICAL DATA:  Initial evaluation for multiple sclerosis, new neurologic event. EXAM: MRI HEAD WITHOUT AND WITH CONTRAST MRI CERVICAL SPINE WITHOUT AND WITH CONTRAST MRI THORACIC SPINE WITHOUT AND WITH CONTRAST TECHNIQUE: Multiplanar, multiecho pulse sequences of the brain and surrounding structures, and cervical spine, to include the craniocervical junction and cervicothoracic junction, as well as the thoracic spine were obtained without and with intravenous contrast. CONTRAST:  78mL GADAVIST GADOBUTROL 1 MMOL/ML IV SOLN COMPARISON:  Recent brain MRI from 10/13/2018 as well as previous cervical spine MRI from 03/06/2017. FINDINGS: MRI HEAD FINDINGS Brain: Cerebral volume stable, and remains within normal limits for age. No abnormal foci of restricted diffusion to suggest acute or subacute infarct. Gray-white matter differentiation maintained without evidence for chronic cortical infarction. No acute intracranial hemorrhage. Single subcentimeter focus of susceptibility artifact noted within the posterior right frontal region, likely a small chronic microhemorrhage, of doubtful significance in isolation. Again seen are multiple scattered foci of T2/FLAIR hyperintensity involving the supratentorial cerebral white matter bilaterally, with involvement of the periventricular, deep, and juxta cortical white matter. No significant involvement of the brainstem or cerebellum. Overall, appearance is relatively stable from previous, without evidence for significant disease progression. No associated restricted diffusion or enhancement to suggest active  demyelination. Pituitary gland suprasellar region normal. Midline structures intact. Vascular: Major intracranial vascular flow voids are maintained. Skull and upper cervical spine: Craniocervical junction within normal limits. Bone marrow signal intensity normal. No scalp soft tissue abnormality. Sinuses/Orbits: Globes orbital soft tissues within normal limits. Mild scattered mucosal thickening noted within the ethmoidal air cells and maxillary sinuses. Paranasal sinuses are otherwise clear. Trace fluid signal intensity within the mastoid air cells bilaterally, of doubtful significance. Inner ear structures grossly normal. Other: None. MRI CERVICAL SPINE FINDINGS Alignment: Chronic straightening of the normal cervical lordosis, stable. No  listhesis or subluxation. Vertebrae: Vertebral body height maintained without evidence for acute or interval fracture. Bone marrow signal intensity within normal limits. No discrete or worrisome osseous lesions. Progressive degenerative reactive endplate changes about the C5-6 and C6-7 interspace. No other abnormal marrow edema or enhancement. Cord: Subtle patchy T2/stir signal abnormality within the cervical spinal cord at the level of C2, C3, and C4 is not significantly changed as compared to 2019. No abnormal enhancement to suggest active demyelination. No evidence for significant disease progression. Posterior Fossa, vertebral arteries, paraspinal tissues: Craniocervical junction within normal limits. Paraspinous and prevertebral soft tissues normal. Normal intravascular flow voids seen within the vertebral arteries bilaterally. Disc levels: C2-C3: Moderate right with advanced left facet hypertrophy, stable from previous. Resultant mild left C3 foraminal narrowing. No significant canal or right neural foraminal stenosis. C3-C4: Moderate bilateral facet hypertrophy. No canal or foraminal stenosis. C4-C5: Shallow central disc protrusion mildly indents the ventral thecal sac. No  significant spinal stenosis or cord deformity. Foramina remain patent. Appearance is unchanged. C5-C6: Chronic intervertebral disc space narrowing with diffuse degenerative disc osteophyte. Broad based left paracentral disc osteophyte complex flattens and effaces the ventral thecal sac, mildly flattening the left ventral cord. This appears similar to previous. Mild left C6 foraminal narrowing. No significant right foraminal encroachment. C6-C7: Chronic intervertebral disc space narrowing with diffuse circumferential disc osteophyte. Broad-based posterior component, asymmetric to the left flattens and largely effaces the ventral thecal sac. Secondary moderate spinal stenosis with flattening of the cervical spinal cord, greater on the left. No cord signal changes. Moderate bilateral C7 foraminal stenosis is stable to mildly progressed. C7-T1: Mild bilateral facet hypertrophy. Minimal endplate spurring without disc bulge. No canal or foraminal stenosis. MRI THORACIC SPINE FINDINGS Alignment: Mild dextroscoliosis. Alignment otherwise normal with preservation of the normal thoracic kyphosis. No listhesis. Vertebrae: Vertebral body height maintained without evidence for acute or chronic fracture. Small endplate Schmorl's node at the superior endplate of [id number]. Underlying bone marrow signal intensity within normal limits. No discrete or worrisome osseous lesions. No abnormal marrow edema or enhancement. Cord: Question subtle patchy cord signal abnormality at the level of T6-7 (series 4, image 10). Additional possible subtle cord signal abnormality within the left hemi cord at the levels of T8-9 (series 7, image 25), and T9-10 (series 7, image 27). Findings consistent with underlying chronic demyelinating disease. No evidence for active demyelination. No other definite cord signal abnormality identified. Underlying cord caliber and morphology within normal limits. Conus terminates at approximately the level of L1. Paraspinal  soft tissues: Paraspinous soft tissues demonstrate no acute finding. Partially visualized visceral structures unremarkable. Dependent atelectatic changes noted within the visualized lungs. Disc levels: Minimal disc bulging seen at T11-12 and T12-L1 without significant stenosis. Mild posterior element hypertrophy at T9-10 through T12-L1. No significant canal or foraminal stenosis. No neural impingement. IMPRESSION: MRI HEAD IMPRESSION 1. Cerebral white matter disease compatible with history of multiple sclerosis, unchanged relative to previous exams. No evidence for active demyelination. 2. No other acute intracranial abnormality. MRI CERVICAL SPINE IMPRESSION 1. Subtle patchy cord signal abnormality at the levels of C2-C4, consistent with chronic demyelinating disease. Overall, appearance is relatively stable as compared to 2019 without evidence for significant disease progression. No evidence for active demyelination. 2. Degenerative disc osteophyte at C6-7 with resultant moderate spinal stenosis and mild mass effect on the left greater than right spinal cord. Moderate bilateral C7 foraminal narrowing is mildly progressed from previous. 3. Advanced degenerative disc osteophyte at C5-6 with resultant mild spinal stenosis  and mild mass effect on the left hemi cord, similar to previous. MRI THORACIC SPINE IMPRESSION 1. Question subtle patchy cord signal abnormality at the level of T6-7, T8-9, and T9-10 as above, suspicious for underlying chronic demyelinating disease. No evidence for active demyelination. 2. Mild degenerative spondylosis within the lower thoracic spine as above without significant canal or foraminal stenosis. No impingement. Electronically Signed   By: Jeannine Boga M.D.   On: 10/26/2018 21:45   Ct Abdomen Pelvis W Contrast  Result Date: 11/12/2018 CLINICAL DATA:  Pain with draining lower abdominal wound EXAM: CT ABDOMEN AND PELVIS WITH CONTRAST TECHNIQUE: Multidetector CT imaging of the  abdomen and pelvis was performed using the standard protocol following bolus administration of intravenous contrast. CONTRAST:  166mL OMNIPAQUE IOHEXOL 300 MG/ML  SOLN COMPARISON:  07/15/2018 FINDINGS: Lower chest: Lung bases demonstrate no acute consolidation or effusion. The heart size is normal Hepatobiliary: No focal liver abnormality is seen. No gallstones, gallbladder wall thickening, or biliary dilatation. Pancreas: Unremarkable. No pancreatic ductal dilatation or surrounding inflammatory changes. Spleen: Normal in size without focal abnormality. Adrenals/Urinary Tract: Adrenal glands are unremarkable. Kidneys show no hydronephrosis. Punctate stone lower pole right kidney. Adrenal glands are within normal limits. Bladder is normal. Stomach/Bowel: The stomach is nonenlarged. No dilated small bowel. No bowel wall thickening. Status post appendectomy. Postsurgical changes of the sigmoid colon. Vascular/Lymphatic: Nonaneurysmal aorta. No significantly enlarged lymph nodes. Reproductive: Uterus and bilateral adnexa are unremarkable. Other: No free air or free fluid. Musculoskeletal: Scarring within the anterior abdominal wall. Lower midline anterior abdominal wall soft tissue thickening with small collection in the subcutaneous fat measuring 13 by 35 mm on axial views, measuring 66 x 22 mm on the prior exam. Craniocaudad measurement of 7 cm. This is contiguous with a linear density that extends towards the skin surface, consistent with a sinus tract there is minimal air within the subcutaneous fatty collection more cephalad. IMPRESSION: 1. Residual fluid collection in the deep subcutaneous soft tissues of the lower anterior abdominal wall, though decreased compared with prior CT from July. Linear tract extending from the collection towards the skin surface consistent with a sinus tract. 2. Otherwise no CT evidence for acute intra-abdominal or pelvic abnormality. 3. Small right kidney stone Electronically Signed    By: Donavan Foil M.D.   On: 11/12/2018 20:28   Dg Knee Complete 4 Views Right  Result Date: 09/13/2018 CLINICAL DATA:  Right knee pain after fall today. Initial encounter. EXAM: RIGHT KNEE - COMPLETE 4+ VIEW COMPARISON:  Plain films right knee 04/30/2014. FINDINGS: No evidence of fracture, dislocation, or joint effusion. No evidence of arthropathy or other focal bone abnormality. Soft tissues are unremarkable. IMPRESSION: Negative exam. Electronically Signed   By: Inge Rise M.D.   On: 09/13/2018 15:02   Dg Hip Unilat W Or Wo Pelvis 2-3 Views Right  Result Date: 09/13/2018 CLINICAL DATA:  Pain status post fall. EXAM: DG HIP (WITH OR WITHOUT PELVIS) 2-3V RIGHT COMPARISON:  None. FINDINGS: There is no evidence of hip fracture or dislocation. There is no evidence of arthropathy or other focal bone abnormality. IMPRESSION: Negative. Electronically Signed   By: Fidela Salisbury M.D.   On: 09/13/2018 15:01    Assessment  The primary encounter diagnosis was Chronic low back pain (Secondary Area of Pain) (Bilateral) (L>R). Diagnoses of Cervical facet syndrome (Bilateral) (R>L), Chronic musculoskeletal pain, and Chronic neck pain (Primary Area of Pain) (Bilateral) (R>L) were also pertinent to this visit.  Plan of Care  I have  discontinued Emily [name]'s cyclobenzaprine. I am also having her maintain her EPINEPHrine, albuterol, Multiple Vitamins-Minerals (MULTIVITAMIN ADULT PO), amLODipine, etonogestrel, loratadine, Fingolimod HCl, topiramate, divalproex, NON FORMULARY, omeprazole, Aimovig, lithium carbonate, FLUoxetine, busPIRone, pregabalin, methocarbamol, dronabinol, diazepam, atomoxetine, haloperidol, pantoprazole, and predniSONE.  Pharmacotherapy (Medications Ordered): No orders of the defined types were placed in this encounter.  Orders:  Orders Placed This Encounter  Procedures  . TENS    Scheduling Instructions:     Please set this patient up with a TENS unit to be used for acute  and subacute flareups of herpain   Follow-up plan:   Return if symptoms worsen or fail to improve.      Interventional management options: Considering:   NOTE: Hold until complete closure and resolution of abdominal wound infection.  Diagnosticmidline thoracic ESI Diagnosticbilateral thoracic facet block  Possiblebilateral thoracic RFA Diagnostic right sided LESI Therapeutic bilateral lumbar facet RFA #1  Diagnosticleft IA knee injection Diagnosticleft knee genicular NB Possibleleft genicular nerve RFA  Diagnosticright-sided CESI Diagnosticright-sided cervical facet block Diagnosticright-sided cervical facet RFA Diagnosticright occipital NB   Palliative PRN treatment(s):   NOTE: No procedures will be scheduled until the patient fully heals from her infected abdominal wound. Palliative bilateral lumbar facet block #3      Recent Visits Date Type Provider Dept  09/21/18 Office Visit Milinda Pointer, MD [facility]  Showing recent visits within past 90 days and meeting all other requirements   Today's Visits Date Type Provider Dept  11/30/18 Telemedicine Milinda Pointer, MD [facility]  Showing today's visits and meeting all other requirements   Future Appointments No visits were found meeting these conditions.  Showing future appointments within next 90 days and meeting all other requirements   I discussed the assessment and treatment plan with the patient. The patient was provided an opportunity to ask questions and all were answered. The patient agreed with the plan and demonstrated an understanding of the instructions.  Patient advised to call back or seek an in-person evaluation if the symptoms or condition worsens.  Total duration of non-face-to-face encounter: 15 minutes.  Note by: Gaspar Cola, MD Date: 11/30/2018; Time: 3:36 PM  Note: This dictation was prepared with Dragon dictation. Any transcriptional errors  that may result from this process are unintentional.  Disclaimer:  * Given the special circumstances of the COVID-19 pandemic, the federal government has announced that the Office for Civil Rights (OCR) will exercise its enforcement discretion and will not impose penalties on physicians using telehealth in the event of noncompliance with regulatory requirements under the [place] and [place] (HIPAA) in connection with the good faith provision of telehealth during the [id number] national public health emergency. ([place])

## 2018-11-30 ENCOUNTER — Other Ambulatory Visit: Payer: Self-pay

## 2018-11-30 ENCOUNTER — Ambulatory Visit: Payer: Medicare Other | Attending: Pain Medicine | Admitting: Pain Medicine

## 2018-11-30 DIAGNOSIS — G8929 Other chronic pain: Secondary | ICD-10-CM

## 2018-11-30 DIAGNOSIS — M47812 Spondylosis without myelopathy or radiculopathy, cervical region: Secondary | ICD-10-CM

## 2018-11-30 DIAGNOSIS — M5441 Lumbago with sciatica, right side: Secondary | ICD-10-CM | POA: Diagnosis not present

## 2018-11-30 DIAGNOSIS — M5442 Lumbago with sciatica, left side: Secondary | ICD-10-CM | POA: Diagnosis not present

## 2018-11-30 DIAGNOSIS — M542 Cervicalgia: Secondary | ICD-10-CM | POA: Diagnosis not present

## 2018-11-30 DIAGNOSIS — M7918 Myalgia, other site: Secondary | ICD-10-CM

## 2018-12-01 ENCOUNTER — Encounter: Payer: Self-pay | Admitting: Internal Medicine

## 2018-12-01 ENCOUNTER — Ambulatory Visit (INDEPENDENT_AMBULATORY_CARE_PROVIDER_SITE_OTHER): Payer: Medicare Other | Admitting: Gastroenterology

## 2018-12-01 ENCOUNTER — Telehealth: Payer: Self-pay

## 2018-12-01 ENCOUNTER — Encounter: Payer: Self-pay | Admitting: Gastroenterology

## 2018-12-01 ENCOUNTER — Other Ambulatory Visit: Payer: Self-pay

## 2018-12-01 DIAGNOSIS — R110 Nausea: Secondary | ICD-10-CM | POA: Diagnosis not present

## 2018-12-01 DIAGNOSIS — I639 Cerebral infarction, unspecified: Secondary | ICD-10-CM

## 2018-12-01 DIAGNOSIS — G43611 Persistent migraine aura with cerebral infarction, intractable, with status migrainosus: Secondary | ICD-10-CM

## 2018-12-01 MED ORDER — DRONABINOL 2.5 MG PO CAPS: 2.5000 mg | ORAL_CAPSULE | Freq: Three times a day (TID) | ORAL | 5 refills | Status: DC

## 2018-12-01 NOTE — Progress Notes (Signed)
Primary Care Physician:  Monico Blitz, MD  Primary GI: Dr. Gala Romney  Patient Location: Home   Provider Location: [facility] office   Reason for Visit: Follow-up    Persons present on the virtual encounter, with roles: NP and patient.    Total time (minutes) spent on medical discussion: 15 minutes   Due to COVID-19, visit was conducted using virtual method.  Visit was requested by patient.  Virtual Visit via Video Note Due to COVID-19, visit is conducted virtually and was requested by patient.   I connected with Emily [name] on 12/01/18 at  9:30 AM EST by video and verified that I am speaking with the correct person using two identifiers.   I discussed the limitations, risks, security and privacy concerns of performing an evaluation and management service by telephone and the availability of in person appointments. I also discussed with the patient that there may be a patient responsible charge related to this service. The patient expressed understanding and agreed to proceed.  Chief Complaint  Patient presents with   Nausea    wants to discuss increasing marinol. Marinol works when in her system but currently she has an "open wound" and is draining pus. She reports the smell is making the nausea worse. Sh eis going to have to have surgery for this     History of Present Illness: 38 year old female with history of chronic nausea. Previously on Zofran, Reglan, Compazine, Phenergan. Remote history of EGD at [facility] about 15 years ago. Placed on Prilosec BID. Nausea present since MS diagnosis. Didn't make it for GES.   July 2020 had a cyst drained from suprapubic area and is now slow to heal. Mesh infected. Has sinus cavity with purulent drainage. Has yeast infections repeatedly due to excess skin over area. Will be having mesh removal in the future. Feels sick the rest of the day after doing wound dressing. Feels like Marinol has helped in the past. Takes first thing in the morning. Can  eat after that. Marinol TID is working. Feels like she is able to pursue GES.   Believes she lost about 20 lbs.   Past Medical History:  Diagnosis Date   ADHD    Anxiety    Asthma as a child   Chronic back pain    Chronic pain    Conversion disorder    DDD (degenerative disc disease), cervical    Depression    External hemorrhoid    Fatty liver    GERD (gastroesophageal reflux disease)    History of kidney stones    HTN (hypertension) 02/27/2015   JC virus antibody positive    Migraines    MS (multiple sclerosis) ([place])    06-2006   Neuropathy    OA (osteoarthritis)    Ovarian cyst    Panic attack    Perforated bowel ([place]) 2009   colostomy bag for 3 motnhs   Pseudoseizures    last seizure was 3 mo ago while taking modafinil   PTSD (post-traumatic stress disorder)    S/P emergency C-section    Seasonal allergies    Urinary urgency    Vertigo    Wears contact lenses    Wears glasses      Past Surgical History:  Procedure Laterality Date   ABDOMINAL SURGERY     APPENDECTOMY     BOWEL RESECTION  01/2007   with colostomy   CESAREAN SECTION N/A 07/12/2015   Procedure: CESAREAN SECTION;  Surgeon: Guss Bunde, MD;  Location: [employer];  Service: Obstetrics;  Laterality: N/A;   COLONOSCOPY WITH PROPOFOL N/A 01/29/2017   Procedure: COLONOSCOPY WITH PROPOFOL;  Surgeon: Ileana Roup, MD;  Location: [facility];  Service: General;  Laterality: N/A;   COLOSTOMY CLOSURE  04/2007   EVALUATION UNDER ANESTHESIA WITH HEMORRHOIDECTOMY N/A 06/11/2018   Procedure: ANORECTAL EXAM UNDER ANESTHESIA WITH HEMORRHOIDECTOMY;  Surgeon: Ileana Roup, MD;  Location: [facility];  Service: General;  Laterality: N/A;   EXCISIONAL HEMORRHOIDECTOMY     EXTREMITY CYST EXCISION  1994   right leg   HERNIA REPAIR     INCISIONAL HERNIA REPAIR N/A 02/16/2016   Procedure: HERNIA REPAIR INCISIONAL;  Surgeon: Fanny Skates, MD;   Location: [facility];  Service: General;  Laterality: N/A;   INSERTION OF MESH  02/16/2016   Procedure: INSERTION OF MESH;  Surgeon: Fanny Skates, MD;  Location: [facility];  Service: General;;   RADIOLOGY WITH ANESTHESIA N/A 03/06/2017   Procedure: MRI WITH ANESTHESIA OF CERVICAL SPINE WITHOUT CONTRAST, MRI OF LUMBAR SPINE WITHOUT CONTRAST;  Surgeon: Radiologist, Medication, MD;  Location: [facility];  Service: Radiology;  Laterality: N/A;   RADIOLOGY WITH ANESTHESIA N/A 09/15/2018   Procedure: MRI OF BRAIN WITH AND WITHOUT CONTRAST;  Surgeon: Radiologist, Medication, MD;  Location: [facility];  Service: Radiology;  Laterality: N/A;   RADIOLOGY WITH ANESTHESIA N/A 10/13/2018   Procedure: MRI OF BRAIN WITH AND WITHOUT CONTRAST;  Surgeon: Radiologist, Medication, MD;  Location: [facility];  Service: Radiology;  Laterality: N/A;   RADIOLOGY WITH ANESTHESIA N/A 10/26/2018   Procedure: MRI UNDER ANESTHESIA; HEAD, CERVICAL AND THORASCIC SPINE;  Surgeon: Radiologist, Medication, MD;  Location: [facility];  Service: Radiology;  Laterality: N/A;   SCAR REVISION  01/21/2011   Procedure: SCAR REVISION;  Surgeon: Hermelinda Dellen;  Location: [facility];  Service: Plastics;  Laterality: N/A;  exploration of scar of abdomen and repair of defect   TOOTH EXTRACTION Left 10/2016   TRANSRECTAL DRAINAGE OF PELVIC ABSCESS       Current Meds  Medication Sig   AIMOVIG 140 MG/ML SOAJ Inject 140 mg into the muscle every 30 (thirty) days.    albuterol (PROVENTIL HFA;VENTOLIN HFA) 108 (90 Base) MCG/ACT inhaler Inhale 2 puffs into the lungs every 6 (six) hours as needed for wheezing or shortness of breath. (Patient taking differently: Inhale 2 puffs into the lungs every 4 (four) hours as needed for wheezing or shortness of breath. )   amLODipine (NORVASC) 5 MG tablet Take 5 mg by mouth every morning.    atomoxetine (STRATTERA) 40 MG capsule TAKE 1 CAPSULE BY MOUTH EVERY DAY   busPIRone (BUSPAR) 15 MG tablet Take 1 tablet  (15 mg total) by mouth 3 (three) times daily.   diazepam (VALIUM) 5 MG tablet Take 1 tablet (5 mg total) by mouth daily as needed for anxiety.   divalproex (DEPAKOTE) 500 MG DR tablet Take 1 tablet (500 mg total) by mouth at bedtime.   EPINEPHrine (EPIPEN) 0.3 mg/0.3 mL SOAJ injection Inject 0.3 mg into the muscle as needed (allergic reaction). Reported on 03/28/2015   etonogestrel (NEXPLANON) 68 MG IMPL implant 1 each by Subdermal route once.   Fingolimod HCl (GILENYA) 0.5 MG CAPS Take 1 capsule (0.5 mg total) by mouth daily. (Patient taking differently: Take 0.5 mg by mouth every morning. )   FLUoxetine (PROZAC) 40 MG capsule Take 2 capsules (80 mg total) by mouth every morning.   haloperidol (HALDOL) 1 MG tablet Take  1 tablet (1 mg total) by mouth 2 (two) times daily.   lithium carbonate (LITHOBID) 300 MG CR tablet Take 2 tablets (600 mg total) by mouth 2 (two) times daily.   loratadine (CLARITIN) 10 MG tablet Take 10 mg by mouth daily as needed for allergies.   methocarbamol (ROBAXIN) 750 MG tablet Take 1 tablet (750 mg total) by mouth 2 (two) times daily as needed for muscle spasms.   Multiple Vitamins-Minerals (MULTIVITAMIN ADULT PO) Take 1 tablet by mouth daily.    NON FORMULARY Take 1 each by mouth at bedtime. CBD Gummy   omeprazole (PRILOSEC) 20 MG capsule TAKE 1 CAPSULE (20 MG TOTAL) BY MOUTH 2 (TWO) TIMES DAILY BEFORE A MEAL.   pregabalin (LYRICA) 300 MG capsule Take 1 capsule (300 mg total) by mouth at bedtime.   topiramate (TOPAMAX) 100 MG tablet Take 1.5 tablets twice a day (Patient taking differently: Take 150 mg by mouth 2 (two) times daily. Take 1.5 tablets twice a day)   [DISCONTINUED] dronabinol (MARINOL) 2.5 MG capsule Take 1 capsule (2.5 mg total) by mouth 2 (two) times daily before lunch and supper. (Patient taking differently: Take 2.5 mg by mouth 3 (three) times daily. )     Family History  Problem Relation Age of Onset   Diabetes Mother    Hypertension  Mother    Diabetes Father    Hypertension Father    Arthritis Father    Cancer Maternal Grandmother    Cancer Maternal Grandfather    Alcohol abuse Neg Hx    Anxiety disorder Neg Hx    Bipolar disorder Neg Hx    Drug abuse Neg Hx    Depression Neg Hx    Colon cancer Neg Hx     Social History   Socioeconomic History   Marital status: Single    Spouse name: Not on file   Number of children: 1   Years of education: College   Highest education level: Not on file  Occupational History    Employer: OTHER    Comment: disability  Social Designer, fashion/clothing strain: Not on file   Food insecurity    Worry: Not on file    Inability: Not on file   Transportation needs    Medical: Not on file    Non-medical: Not on file  Tobacco Use   Smoking status: Former Smoker    Packs/day: 0.25    Years: 10.00    Pack years: 2.50    Types: Cigarettes    Quit date: 07/12/2015    Years since quitting: 3.3   Smokeless tobacco: Never Used  Substance and Sexual Activity   Alcohol use: No    Alcohol/week: 0.0 standard drinks    Comment: Rare   Drug use: No    Comment: marinol shows up as THC   Sexual activity: Not Currently    Partners: Male    Birth control/protection: None, Implant  Lifestyle   Physical activity    Days per week: Not on file    Minutes per session: Not on file   Stress: Not on file  Relationships   Social connections    Talks on phone: Not on file    Gets together: Not on file    Attends religious service: Not on file    Active member of club or organization: Not on file    Attends meetings of clubs or organizations: Not on file    Relationship status: Not on file  Other Topics  Concern   Not on file  Social History Narrative    Born and raised in [place], [place] by parents. Pt has one younger sister. Pt has a Best boy. Pt worked from 1999-2006. She stopped due to MS and is currently on disability.         Married for less than 1 yr in 1999 that ended in divorce.      Caffeine Use: 1 20oz soda daily      Lives with mom and daughter       Review of Systems: Gen: see HPI CV: Denies chest pain, palpitations, syncope, peripheral edema, and claudication. Resp: Denies dyspnea at rest, cough, wheezing, coughing up blood, and pleurisy. GI: see HPI Derm: Denies rash, itching, dry skin Psych: Denies depression, anxiety, memory loss, confusion. No homicidal or suicidal ideation.  Heme: Denies bruising, bleeding, and enlarged lymph nodes.  Observations/Objective: No distress. Keeps eye contact. Pleasant and cooperative.   Assessment and Plan: 38 year old female with history of chronic nausea, previously failing Zofran, Reglan, compazine, phenergan. Query gastroparesis but has been unable to complete GES. She has noted improvement with Marinol. We will pursue GES now as she is able to complete this; previously, she had other health issues that limited her appointment options. Will see her again in 6 months or sooner if needed.  Follow Up Instructions: See AVS   I discussed the assessment and treatment plan with the patient. The patient was provided an opportunity to ask questions and all were answered. The patient agreed with the plan and demonstrated an understanding of the instructions.   The patient was advised to call back or seek an in-person evaluation if the symptoms worsen or if the condition fails to improve as anticipated.  I provided 15 minutes of non-face-to-face time during this encounter.  Annitta Needs, PhD, ANP-BC [facility]

## 2018-12-01 NOTE — Telephone Encounter (Signed)
GES scheduled for 01/11/19 at 8:00am, arrive at 7:45am. NPO after midnight prior to test and no stomach meds.  Tried to call pt, no answer, LMOVM and informed pt of appt. Letter mailed.

## 2018-12-01 NOTE — Patient Instructions (Signed)
Continue Marinol three times per day.  We are arranging a gastric emptying study in the future.  We will see you in 6 months!  Call if any worsening of symptoms!  Have a wonderful holiday season!  I enjoyed seeing you again today! As you know, I value our relationship and want to provide genuine, compassionate, and quality care. I welcome your feedback. If you receive a survey regarding your visit,  I greatly appreciate you taking time to fill this out. See you next time!  Annitta Needs, PhD, ANP-BC [facility]

## 2018-12-07 ENCOUNTER — Ambulatory Visit: Payer: Medicare Other | Admitting: Psychology

## 2018-12-10 ENCOUNTER — Ambulatory Visit (INDEPENDENT_AMBULATORY_CARE_PROVIDER_SITE_OTHER): Payer: Medicare Other | Admitting: Psychiatry

## 2018-12-10 ENCOUNTER — Other Ambulatory Visit: Payer: Self-pay

## 2018-12-10 DIAGNOSIS — F411 Generalized anxiety disorder: Secondary | ICD-10-CM

## 2018-12-10 DIAGNOSIS — G43611 Persistent migraine aura with cerebral infarction, intractable, with status migrainosus: Secondary | ICD-10-CM | POA: Diagnosis not present

## 2018-12-10 DIAGNOSIS — F332 Major depressive disorder, recurrent severe without psychotic features: Secondary | ICD-10-CM | POA: Diagnosis not present

## 2018-12-10 DIAGNOSIS — F4001 Agoraphobia with panic disorder: Secondary | ICD-10-CM | POA: Diagnosis not present

## 2018-12-10 DIAGNOSIS — I639 Cerebral infarction, unspecified: Secondary | ICD-10-CM

## 2018-12-10 DIAGNOSIS — F445 Conversion disorder with seizures or convulsions: Secondary | ICD-10-CM

## 2018-12-10 NOTE — Progress Notes (Signed)
Virtual Visit via Telephone Note  I connected with Emily [name]  on 12/10/18 at 10:45 AM EST by telephone and verified that I am speaking with the correct person using two identifiers.  Location: Patient: home Provider: office   I discussed the limitations, risks, security and privacy concerns of performing an evaluation and management service by telephone and the availability of in person appointments. I also discussed with the patient that there may be a patient responsible charge related to this service. The patient expressed understanding and agreed to proceed.   History of Present Illness: Patient reports that nothing is changed since our last visit.  Her parents continue to tell her that she is not "pulling her weight".  Patient states that she is giving "120%".  None of it seems to be good enough and her parents continue to threaten to evict her and take her child.  Patient is having pseudoseizures.  She is feeling depressed and anxious.  She does not want to be separated from her child.  As a backup plan she has contacted a friend who lives in [place].  This friend is willing to allow Emily [name] and her daughter to stay for a short period of time.  Patient does not want to leave because she does not want to go through the stress of finding new physicians for herself and her daughter.  Patient states that she does not know what to do.  Her anxiety level is extremely high and she is always on edge.  She asked if we could increase the Valium dose back up to 3 times a day. She denies SI/HI.     Observations/Objective: I spoke with [name] on the phone.  Pt was calm and cooperative.  Pt was engaged in the conversation and answered questions appropriately.  Speech was clear and coherent with normal rate, tone and volume.  Mood is depressed and anxious, affect is congruent. Thought processes are coherent and circumstantial.  Thought content is with ruminations.  Pt denies SI/HI.    Pt denies auditory and visual hallucinations and did not appear to be responding to internal stimuli.  Memory and concentration are good.  Fund of knowledge and use of language are average.  Insight and judgment are on the poor side.  I am unable to comment on psychomotor activity, general appearance, hygiene, or eye contact as I was unable to physically see the patient on the phone.  Vital signs not available since interview conducted virtually.    I reviewed the information below on 12/10/2018 and have updated it Assessment and Plan: GAD; Conversion d/o with pseudoseizures; MDD-recurrent, moderate; PTSD; Insomnia; ADHD-inattentive type; Cluster B personality traits.     Status of current symptoms: Unchanged  1. Haldol 1mg  po BID 2. Lithium CR 1200mg  po qD 3. Prozac 80mg  po qD 4. Buspar 15mg  po TID 5. Valium 5mg  po qD prn anxiety- plan taper off due to concerns about over sedation 6. Strattera 40mg  po qD for ADHD.   Will avoid stimulants due to ongoing anxiety and my concerns that Mariko maybe abusing medications.    Topamax and Depakote prescribed by her neurologist   On review of her meds we discussed how she is on large number of sedating meds and how that can contribute to fatigue.   -I declined patient's request to increase the Valium to 3 times daily dosing.  She states that this is what we were doing prior to the last several months.  She does  not understand why the Valium could not be prescribed in that way due to her current stressors.  I again stressed that we are working on stopping treatment with benzodiazepines.  Patient states that she knows that but thought that since her anxiety is high I would make an exception this time.   My goal is stop all benzos and limit sedatives as much as possible.    I recent reviewed her recent visits to the ED. She has been going every 2-3 weeks on average for pain and seizures. Each time she asks for narcotics or benzos.    Her sister  continues to do therapy with the patient. Her sister lives in [place] and will not allow Emily [name] to live with her.   Follow Up Instructions: At this point in time I feel that the patient would be better served by a psychiatrist who is to was more available.  I do not have further recommendations for treatment and feel that we come to a point where she may find better success with another psychiatrist.  I will have my staff send her the names of other psychiatrists in the area.  Patient verbalized understanding and was agreeable to this plan.   I discussed the assessment and treatment plan with the patient. The patient was provided an opportunity to ask questions and all were answered. The patient agreed with the plan and demonstrated an understanding of the instructions.   The patient was advised to call back or seek an in-person evaluation if the symptoms worsen or if the condition fails to improve as anticipated.  I provided 45 minutes of non-face-to-face time during this encounter.   Charlcie Cradle, MD

## 2018-12-11 ENCOUNTER — Emergency Department (HOSPITAL_COMMUNITY): Payer: Medicare Other

## 2018-12-11 ENCOUNTER — Encounter (HOSPITAL_COMMUNITY): Payer: Self-pay | Admitting: Emergency Medicine

## 2018-12-11 ENCOUNTER — Emergency Department (HOSPITAL_COMMUNITY)
Admission: EM | Admit: 2018-12-11 | Discharge: 2018-12-11 | Disposition: A | Discharge status: Home / Self Care | Payer: Medicare Other | Attending: Emergency Medicine | Admitting: Emergency Medicine

## 2018-12-11 ENCOUNTER — Other Ambulatory Visit: Payer: Self-pay

## 2018-12-11 DIAGNOSIS — R509 Fever, unspecified: Secondary | ICD-10-CM | POA: Insufficient documentation

## 2018-12-11 DIAGNOSIS — I10 Essential (primary) hypertension: Secondary | ICD-10-CM | POA: Diagnosis not present

## 2018-12-11 DIAGNOSIS — Z87891 Personal history of nicotine dependence: Secondary | ICD-10-CM | POA: Insufficient documentation

## 2018-12-11 DIAGNOSIS — L988 Other specified disorders of the skin and subcutaneous tissue: Secondary | ICD-10-CM

## 2018-12-11 DIAGNOSIS — R1030 Lower abdominal pain, unspecified: Secondary | ICD-10-CM | POA: Insufficient documentation

## 2018-12-11 DIAGNOSIS — Z79899 Other long term (current) drug therapy: Secondary | ICD-10-CM | POA: Insufficient documentation

## 2018-12-11 DIAGNOSIS — N200 Calculus of kidney: Secondary | ICD-10-CM | POA: Diagnosis not present

## 2018-12-11 DIAGNOSIS — L089 Local infection of the skin and subcutaneous tissue, unspecified: Secondary | ICD-10-CM | POA: Diagnosis present

## 2018-12-11 LAB — CBC WITH DIFFERENTIAL/PLATELET
Abs Immature Granulocytes: 0.01 10*3/uL (ref 0.00–0.07)
Basophils Absolute: 0 10*3/uL (ref 0.0–0.1)
Basophils Relative: 0 %
Eosinophils Absolute: 0.1 10*3/uL (ref 0.0–0.5)
Eosinophils Relative: 2 %
HCT: 47.4 % — ABNORMAL HIGH (ref 36.0–46.0)
Hemoglobin: 14.9 g/dL (ref 12.0–15.0)
Immature Granulocytes: 0 %
Lymphocytes Relative: 14 %
Lymphs Abs: 0.8 10*3/uL (ref 0.7–4.0)
MCH: 28.9 pg (ref 26.0–34.0)
MCHC: 31.4 g/dL (ref 30.0–36.0)
MCV: 92 fL (ref 80.0–100.0)
Monocytes Absolute: 0.5 10*3/uL (ref 0.1–1.0)
Monocytes Relative: 9 %
Neutro Abs: 4.6 10*3/uL (ref 1.7–7.7)
Neutrophils Relative %: 75 %
Platelets: 499 10*3/uL — ABNORMAL HIGH (ref 150–400)
RBC: 5.15 MIL/uL — ABNORMAL HIGH (ref 3.87–5.11)
RDW: 13.8 % (ref 11.5–15.5)
WBC: 6 10*3/uL (ref 4.0–10.5)
nRBC: 0 % (ref 0.0–0.2)

## 2018-12-11 LAB — BASIC METABOLIC PANEL
Anion gap: 9 (ref 5–15)
BUN: 9 mg/dL (ref 6–20)
CO2: 27 mmol/L (ref 22–32)
Calcium: 9.2 mg/dL (ref 8.9–10.3)
Chloride: 103 mmol/L (ref 98–111)
Creatinine, Ser: 0.66 mg/dL (ref 0.44–1.00)
GFR calc Af Amer: >60 mL/min (ref >60)
GFR calc non Af Amer: >60 mL/min (ref >60)
Glucose, Bld: 96 mg/dL (ref 70–99)
Potassium: 3.8 mmol/L (ref 3.5–5.1)
Sodium: 139 mmol/L (ref 135–145)

## 2018-12-11 MED ORDER — SULFAMETHOXAZOLE-TRIMETHOPRIM 800-160 MG PO TABS: 1.0000 | ORAL_TABLET | Freq: Two times a day (BID) | ORAL | 0 refills | Status: AC

## 2018-12-11 MED ORDER — HYDROMORPHONE HCL 1 MG/ML IJ SOLN
0.5000 mg | Freq: Once | INTRAMUSCULAR | Status: AC
  Administered 2018-12-11: 0.5 mg via INTRAVENOUS
  Filled 2018-12-11: qty 1

## 2018-12-11 MED ORDER — OXYCODONE HCL 5 MG PO TABS: 5.0000 mg | ORAL_TABLET | ORAL | 0 refills | Status: DC | PRN

## 2018-12-11 MED ORDER — IOHEXOL 300 MG/ML  SOLN
100.0000 mL | Freq: Once | INTRAMUSCULAR | Status: AC | PRN
  Administered 2018-12-11: 100 mL via INTRAVENOUS

## 2018-12-11 NOTE — ED Notes (Addendum)
Patient states that her doctor told her to come to ED and get consult with Surgeon so that she can get surgical date at [facility] surgery. Otherwise no acute distress.

## 2018-12-11 NOTE — ED Provider Notes (Signed)
  Provider Note MRN:  [id number]  Arrival date & time: 12/11/18    ED Course and Medical Decision Making  Assumed care from Dr. Roslynn Amble at shift change.  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  Anticipating discharge on Bactrim with advised to follow-up with her surgeon.  5:30 PM update: CT scan is without significant change.  No indication for emergent surgical consultation.  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  Cannot play with her daughter etc.  Referred to general surgery.  Procedures  Final Clinical Impressions(s) / ED Diagnoses     ICD-10-CM   1. Draining cutaneous sinus tract  L98.8     ED Discharge Orders         Ordered    sulfamethoxazole-trimethoprim (BACTRIM DS) 800-160 MG tablet  2 times daily     12/11/18 1607    oxyCODONE (ROXICODONE) 5 MG immediate release tablet  Every 4 hours PRN     12/11/18 1734            Discharge Instructions     You were evaluated in the Emergency Department and after careful evaluation, we did not find any emergent condition requiring admission or further testing in the hospital.  Your surgeons advised you to come to the emergency department today and it was important that you did so to exclude significant changes or emergencies to your condition.  Your CT scan was reassuring.  Please take the antibiotics as directed.  We sent a personal message to Dr. Ralene Ok, please call his office for outpatient management.  Please return to the Emergency Department if you experience any worsening of your condition.  We encourage you to follow up with a primary care provider.  Thank you for allowing [place] to be a part of your care.     Barth Kirks. Sedonia Small, [place] [email or web address]    Maudie Flakes, MD 12/11/18 1736

## 2018-12-11 NOTE — ED Triage Notes (Addendum)
Increased wound drainage on wound on lower abd since September.  Also reports shortness of breath with activity

## 2018-12-11 NOTE — Discharge Instructions (Addendum)
You were evaluated in the Emergency Department and after careful evaluation, we did not find any emergent condition requiring admission or further testing in the hospital.  Your surgeons advised you to come to the emergency department today and it was important that you did so to exclude significant changes or emergencies to your condition.  Your CT scan was reassuring.  Please take the antibiotics as directed.  We sent a personal message to Dr. Ralene Ok, please call his office for outpatient management.  Please return to the Emergency Department if you experience any worsening of your condition.  We encourage you to follow up with a primary care provider.  Thank you for allowing [place] to be a part of your care.

## 2018-12-11 NOTE — ED Provider Notes (Signed)
[facility] EMERGENCY DEPARTMENT Provider Note   CSN: [id number] Arrival date & time: 12/11/18  1158     History   Chief Complaint Chief Complaint  Patient presents with  . Wound Infection    HPI Emily [name] is a 38 y.o. female.  Presents to ER with chief concern of possible wound infection.  Patient states that she has had a small sinus tract in her lower abdomen that has been draining for many months, states she was post follow-up with her surgeon after she was seen in the ER 1 month ago but has not done so yet.  Has an appointment on 16 December.  States that daily she has some drainage from this sinus tract.  Purulent.  No overlying erythema, no fevers, no swelling.  No associated abdominal pain, no dysuria, hematuria, no vomiting or nausea.     HPI  Past Medical History:  Diagnosis Date  . ADHD   . Anxiety   . Asthma as a child  . Chronic back pain   . Chronic pain   . Conversion disorder   . DDD (degenerative disc disease), cervical   . Depression   . External hemorrhoid   . Fatty liver   . GERD (gastroesophageal reflux disease)   . History of kidney stones   . HTN (hypertension) 02/27/2015  . JC virus antibody positive   . Migraines   . MS (multiple sclerosis) ([place])    06-2006  . Neuropathy   . OA (osteoarthritis)   . Ovarian cyst   . Panic attack   . Perforated bowel ([place]) 2009   colostomy bag for 3 motnhs  . 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 PTSD (post-traumatic stress disorder)   . S/P emergency C-section   . Seasonal allergies   . Urinary urgency   . Vertigo   . Wears contact lenses   . Wears glasses     Patient Active Problem List   Diagnosis Date Noted  . S/P emergency C-section   . Pseudoseizures   . MS (multiple sclerosis) ([place])   . ADHD   . Anxiety   . Chronic pain   . Panic attack   . Open wound of abdominal wall, anterior, complicated, sequela [id number]  . Postoperative wound infection  09/20/2018  . Acute postoperative pain 08/25/2018  . History of substance use disorder 07/19/2018  . Nausea without vomiting 04/13/2018  . Nausea with vomiting 03/23/2018  . Pharmacologic therapy 03/16/2018  . Disorder of skeletal system 03/16/2018  . Problems influencing health status 03/16/2018  . Abnormal MRI, cervical spine (03/06/17) 03/16/2018  . Morbid obesity with BMI of 45.0-49.9, adult ([place]) 03/16/2018  . Chronic pain of both knees 02/18/2018  . Cervical facet hypertrophy 04/29/2017  . Cervical facet syndrome (Bilateral) (R>L) 04/29/2017  . Spondylosis without myelopathy or radiculopathy, cervical region 04/29/2017  . Spondylosis without myelopathy or radiculopathy, lumbosacral region 03/04/2017  . Cervicogenic headache 02/10/2017  . Occipital headache 02/10/2017  . Trigger point with back pain (Left) 02/10/2017  . History of fainting (vasovagal) 02/10/2017  . History of vasovagal syncope 02/10/2017  . Acute left-sided low back pain without sciatica 02/10/2017  . Neurogenic pain 01/23/2017  . Chronic musculoskeletal pain 12/02/2016  . Cervical radiculitis (Bilateral) 11/12/2016  . Chronic upper back pain (Primary Area of Pain) (Bilateral) (R>L) 11/01/2016  . Chronic hand pain [facility] Area of Pain) (Bilateral) (L>R) 11/01/2016  . Chronic wrist pain (Tertiary Area of Pain) (  Bilateral) (L>R) 11/01/2016  . Chronic foot/toes pain (Fourth Area of Pain) (Bilateral) (R>L) 11/01/2016  . Chronic upper extremity pain (Secondary Area of Pain) (Bilateral) (L>R) 11/01/2016  . Long term prescription benzodiazepine use 11/01/2016  . 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 Cervical foraminal stenosis (Bilateral) (C6-7) 11/01/2016  . 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 DDD (degenerative disc disease), thoracic 11/01/2016  . DDD (degenerative disc disease), lumbar 11/01/2016  . Lumbar facet arthropathy 11/01/2016  . Lumbar facet syndrome  (Bilateral) (R>L) 11/01/2016  . Vitamin D deficiency 10/28/2016  . Opiate use 07/23/2016  . Chronic neck pain (Primary Area of Pain) (Bilateral) (R>L) 07/23/2016  . Chronic low back pain (Secondary Area of Pain) (Bilateral) (L>R) 07/23/2016  . Chronic knee pain (Fifth Area of Pain) (Left) 07/23/2016  . Midline thoracic back pain 07/23/2016  . Chronic lower extremity pain (Bilateral) 07/23/2016  . Incarcerated incisional hernia 02/16/2016  . Incisional hernia 02/15/2016  . Carpal tunnel syndrome [facility] Area of Pain) (Bilateral) (L>R) 11/13/2015  . Substance abuse ([place]) 09/07/2015  . Paresthesia 08/02/2015  . Chronic pain syndrome 08/02/2015  . Postpartum endometritis 07/21/2015  . PTSD (post-traumatic stress disorder) 05/16/2015  . Conversion disorder with seizures or convulsions 05/16/2015  . 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 GAD (generalized anxiety disorder) 05/16/2015  . Difficult intravenous access 05/08/2015  . Right optic neuritis 04/27/2015  . Conversion disorder with attacks or seizures, persistent, with psychological stressor 03/28/2015  . Migraine with aura and without status migrainosus, not intractable 03/28/2015  . Generalized anxiety disorder 03/28/2015  . HTN (hypertension) 02/27/2015  . Perforated bowel ([place]) 02/27/2015  . Depression 12/27/2014  . Multiple sclerosis ([place]) 12/27/2014  . Migraine 12/27/2014  . Seizure disorder ([place]) 12/27/2014  . Smoker 12/27/2014  . Obesity 12/27/2014  . DDD (degenerative disc disease), cervical 07/28/2014  . Major depressive disorder, single episode 11/02/2013  . Laryngopharyngeal reflux 10/25/2010  . Tonsillitis 10/25/2010    Past Surgical History:  Procedure Laterality Date  . ABDOMINAL SURGERY    . APPENDECTOMY    . BOWEL RESECTION  01/2007   with colostomy  . CESAREAN SECTION N/A 07/12/2015   Procedure: CESAREAN SECTION;  Surgeon: Guss Bunde, MD;   Location: [place];  Service: Obstetrics;  Laterality: N/A;  . COLONOSCOPY WITH PROPOFOL N/A 01/29/2017   Procedure: COLONOSCOPY WITH PROPOFOL;  Surgeon: Ileana Roup, MD;  Location: [facility];  Service: General;  Laterality: N/A;  . COLOSTOMY CLOSURE  04/2007  . EVALUATION UNDER ANESTHESIA WITH HEMORRHOIDECTOMY N/A 06/11/2018   Procedure: ANORECTAL EXAM UNDER ANESTHESIA WITH HEMORRHOIDECTOMY;  Surgeon: Ileana Roup, MD;  Location: [place];  Service: General;  Laterality: N/A;  . EXCISIONAL HEMORRHOIDECTOMY    . EXTREMITY CYST EXCISION  1994   right leg  . HERNIA REPAIR    . INCISIONAL HERNIA REPAIR N/A 02/16/2016   Procedure: HERNIA REPAIR INCISIONAL;  Surgeon: Fanny Skates, MD;  Location: [facility];  Service: General;  Laterality: N/A;  . INSERTION OF MESH  02/16/2016   Procedure: INSERTION OF MESH;  Surgeon: Fanny Skates, MD;  Location: [facility];  Service: General;;  . RADIOLOGY WITH ANESTHESIA N/A 03/06/2017   Procedure: MRI WITH ANESTHESIA OF CERVICAL SPINE WITHOUT CONTRAST, MRI OF LUMBAR SPINE WITHOUT CONTRAST;  Surgeon: Radiologist, Medication, MD;  Location: [place];  Service: Radiology;  Laterality: N/A;  . RADIOLOGY WITH ANESTHESIA N/A 09/15/2018   Procedure: MRI  OF BRAIN WITH AND WITHOUT CONTRAST;  Surgeon: Radiologist, Medication, MD;  Location: [place];  Service: Radiology;  Laterality: N/A;  . RADIOLOGY WITH ANESTHESIA N/A 10/13/2018   Procedure: MRI OF BRAIN WITH AND WITHOUT CONTRAST;  Surgeon: Radiologist, Medication, MD;  Location: [place];  Service: Radiology;  Laterality: N/A;  . RADIOLOGY WITH ANESTHESIA N/A 10/26/2018   Procedure: MRI UNDER ANESTHESIA; HEAD, CERVICAL AND THORASCIC SPINE;  Surgeon: Radiologist, Medication, MD;  Location: [place];  Service: Radiology;  Laterality: N/A;  . SCAR REVISION  01/21/2011   Procedure: SCAR REVISION;  Surgeon: Hermelinda Dellen;  Location: [place];  Service: Plastics;  Laterality: N/A;   exploration of scar of abdomen and repair of defect  . TOOTH EXTRACTION Left 10/2016  . TRANSRECTAL DRAINAGE OF PELVIC ABSCESS       OB History    Gravida  1   Para  1   Term      Preterm  1   AB      Living  1     SAB      TAB      Ectopic      Multiple  0   Live Births  1            Home Medications    Prior to Admission medications   Medication Sig Start Date End Date Taking? Authorizing Provider  AIMOVIG 140 MG/ML SOAJ Inject 140 mg into the muscle every 30 (thirty) days.  08/21/18   [provider]  albuterol (PROVENTIL HFA;VENTOLIN HFA) 108 (90 Base) MCG/ACT inhaler Inhale 2 puffs into the lungs every 6 (six) hours as needed for wheezing or shortness of breath. Patient taking differently: Inhale 2 puffs into the lungs every 4 (four) hours as needed for wheezing or shortness of breath.  02/15/15   Jaclyn Prime, Collene Leyden, PA-C  amLODipine (NORVASC) 5 MG tablet Take 5 mg by mouth every morning.  01/23/16   [provider]  atomoxetine (STRATTERA) 40 MG capsule TAKE 1 CAPSULE BY MOUTH EVERY DAY 11/05/18   Charlcie Cradle, MD  busPIRone (BUSPAR) 15 MG tablet Take 1 tablet (15 mg total) by mouth 3 (three) times daily. 09/17/18   Charlcie Cradle, MD  diazepam (VALIUM) 5 MG tablet Take 1 tablet (5 mg total) by mouth daily as needed for anxiety. 10/29/18 10/29/19  Charlcie Cradle, MD  divalproex (DEPAKOTE) 500 MG DR tablet Take 1 tablet (500 mg total) by mouth at bedtime. 05/05/18   Cameron Sprang, MD  dronabinol (MARINOL) 2.5 MG capsule Take 1 capsule (2.5 mg total) by mouth 3 (three) times daily. 12/01/18   Annitta Needs, NP  EPINEPHrine (EPIPEN) 0.3 mg/0.3 mL SOAJ injection Inject 0.3 mg into the muscle as needed (allergic reaction). Reported on 03/28/2015    [provider]  etonogestrel (NEXPLANON) 68 MG IMPL implant 1 each by Subdermal route once.    [provider]  Fingolimod HCl (GILENYA) 0.5 MG CAPS Take 1 capsule (0.5 mg total) by  mouth daily. Patient taking differently: Take 0.5 mg by mouth every morning.  05/05/18   Cameron Sprang, MD  FLUoxetine (PROZAC) 40 MG capsule Take 2 capsules (80 mg total) by mouth every morning. 09/17/18 09/17/19  Charlcie Cradle, MD  haloperidol (HALDOL) 1 MG tablet Take 1 tablet (1 mg total) by mouth 2 (two) times daily. 11/17/18   Charlcie Cradle, MD  lithium carbonate (LITHOBID) 300 MG CR tablet Take 2 tablets (600 mg total) by  mouth 2 (two) times daily. 09/17/18   Charlcie Cradle, MD  loratadine (CLARITIN) 10 MG tablet Take 10 mg by mouth daily as needed for allergies.    [provider]  methocarbamol (ROBAXIN) 750 MG tablet Take 1 tablet (750 mg total) by mouth 2 (two) times daily as needed for muscle spasms. 09/21/18 03/20/19  Milinda Pointer, MD  Multiple Vitamins-Minerals (MULTIVITAMIN ADULT PO) Take 1 tablet by mouth daily.     [provider]  NON FORMULARY Take 1 each by mouth at bedtime. CBD Gummy    [provider]  omeprazole (PRILOSEC) 20 MG capsule TAKE 1 CAPSULE (20 MG TOTAL) BY MOUTH 2 (TWO) TIMES DAILY BEFORE A MEAL. 06/18/18   Annitta Needs, NP  pregabalin (LYRICA) 300 MG capsule Take 1 capsule (300 mg total) by mouth at bedtime. 09/21/18 03/20/19  Milinda Pointer, MD  topiramate (TOPAMAX) 100 MG tablet Take 1.5 tablets twice a day Patient taking differently: Take 150 mg by mouth 2 (two) times daily. Take 1.5 tablets twice a day 05/05/18   Cameron Sprang, MD    Family History Family History  Problem Relation Age of Onset  . Diabetes Mother   . Hypertension Mother   . Diabetes Father   . Hypertension Father   . Arthritis Father   . Cancer Maternal Grandmother   . Cancer Maternal Grandfather   . Alcohol abuse Neg Hx   . Anxiety disorder Neg Hx   . Bipolar disorder Neg Hx   . Drug abuse Neg Hx   . Depression Neg Hx   . Colon cancer Neg Hx     Social History Social History   Tobacco Use  . Smoking status: Former Smoker    Packs/day:  0.25    Years: 10.00    Pack years: 2.50    Types: Cigarettes    Quit date: 07/12/2015    Years since quitting: 3.4  . Smokeless tobacco: Never Used  Substance Use Topics  . Alcohol use: No    Alcohol/week: 0.0 standard drinks    Comment: Rare  . Drug use: No    Comment: marinol shows up as THC     Allergies   Baclofen, Duloxetine hcl, Gabapentin, Monosodium glutamate, Other, Alprazolam, Amitriptyline, Magnesium salicylate, Modafinil, Rizatriptan, Tizanidine, Vicodin [hydrocodone-acetaminophen], Acetaminophen, Adhesive [tape], Hydrocodone-acetaminophen, Ketorolac tromethamine, Lamotrigine, Nsaids, and Tramadol   Review of Systems Review of Systems  Constitutional: Negative for chills and fever.  HENT: Negative for ear pain and sore throat.   Eyes: Negative for pain and visual disturbance.  Respiratory: Negative for cough and shortness of breath.   Cardiovascular: Negative for chest pain and palpitations.  Gastrointestinal: Positive for abdominal pain. Negative for vomiting.  Genitourinary: Negative for dysuria and hematuria.  Musculoskeletal: Negative for arthralgias and back pain.  Skin: Negative for color change and rash.  Neurological: Negative for seizures and syncope.  All other systems reviewed and are negative.    Physical Exam Updated Vital Signs BP (!) 161/117   Pulse 99   Temp 98.4 F (36.9 C) (Oral)   Resp 18   Ht 5\' 5"  (1.651 m)   Wt 113.4 kg   SpO2 99%   BMI 41.60 kg/m   Physical Exam Vitals signs and nursing note reviewed.  Constitutional:      General: She is not in acute distress.    Appearance: She is well-developed.  HENT:     Head: Normocephalic and atraumatic.  Eyes:     Conjunctiva/sclera: Conjunctivae normal.  Neck:     Musculoskeletal: Neck supple.  Cardiovascular:     Rate and Rhythm: Normal rate and regular rhythm.     Heart sounds: No murmur.  Pulmonary:     Effort: Pulmonary effort is normal. No respiratory distress.     Breath  sounds: Normal breath sounds.  Abdominal:     General: There is no distension.     Palpations: Abdomen is soft. There is no mass.     Tenderness: There is no abdominal tenderness.     Comments: Old, healed midline incision scar, lower midline 0.5cm wide hole at base of pannus, no surrounding erythema, no underling induration  Skin:    General: Skin is warm and dry.  Neurological:     General: No focal deficit present.     Mental Status: She is alert and oriented to person, place, and time.      ED Treatments / Results  Labs (all labs ordered are listed, but only abnormal results are displayed) Labs Reviewed  CBC WITH DIFFERENTIAL/PLATELET - Abnormal; Notable for the following components:      Result Value   RBC 5.15 (*)    HCT 47.4 (*)    Platelets 499 (*)    All other components within normal limits  BASIC METABOLIC PANEL    EKG None  Radiology No results found.  Procedures Procedures (including critical care time)  Medications Ordered in ED Medications  HYDROmorphone (DILAUDID) injection 0.5 mg (0.5 mg Intravenous Given 12/11/18 1355)     Initial Impression / Assessment and Plan / ED Course  I have reviewed the triage vital signs and the nursing notes.  Pertinent labs & imaging results that were available during my care of the patient were reviewed by me and considered in my medical decision making (see chart for details).        38 year old lady presents to ER with complaint of drainage from a chronic sinus tract.  Presentation today identical to presentation 1 month ago, no change in symptoms.  On exam patient noted to be well-appearing, no signs or symptoms of sepsis.  Her chronic wound does not appear to have superimposed infection.  No significant erythema or induration was appreciated.  CT from 11 5 demonstrated small fluid collection with sinus tract.  Labs within normal limits.  CT ordered again today to evaluate for any significant change.  If no new  findings on CT scan, believe patient will likely be appropriate for continued outpatient management and close follow-up with general surgery as outpatient.  While awaiting CT scan, patient signed out to Dr. Sedonia Small.  Please refer to his note for final plan disposition.  Final Clinical Impressions(s) / ED Diagnoses   Final diagnoses:  Draining cutaneous sinus tract    ED Discharge Orders    None       Lucrezia Starch, MD 12/11/18 1609

## 2018-12-14 DIAGNOSIS — R350 Frequency of micturition: Secondary | ICD-10-CM | POA: Diagnosis not present

## 2018-12-14 DIAGNOSIS — L7682 Other postprocedural complications of skin and subcutaneous tissue: Secondary | ICD-10-CM | POA: Diagnosis not present

## 2018-12-14 DIAGNOSIS — L089 Local infection of the skin and subcutaneous tissue, unspecified: Secondary | ICD-10-CM | POA: Diagnosis not present

## 2018-12-16 ENCOUNTER — Other Ambulatory Visit: Payer: Self-pay | Admitting: Pain Medicine

## 2018-12-16 ENCOUNTER — Telehealth (HOSPITAL_COMMUNITY): Payer: Self-pay | Admitting: Psychiatry

## 2018-12-16 ENCOUNTER — Other Ambulatory Visit (HOSPITAL_COMMUNITY): Payer: Self-pay | Admitting: Psychiatry

## 2018-12-16 DIAGNOSIS — M792 Neuralgia and neuritis, unspecified: Secondary | ICD-10-CM

## 2018-12-16 DIAGNOSIS — F332 Major depressive disorder, recurrent severe without psychotic features: Secondary | ICD-10-CM

## 2018-12-16 DIAGNOSIS — F445 Conversion disorder with seizures or convulsions: Secondary | ICD-10-CM

## 2018-12-17 ENCOUNTER — Ambulatory Visit (HOSPITAL_COMMUNITY): Payer: Medicare Other | Admitting: Licensed Clinical Social Worker

## 2018-12-17 ENCOUNTER — Telehealth (HOSPITAL_COMMUNITY): Payer: Self-pay | Admitting: Psychiatry

## 2018-12-17 ENCOUNTER — Other Ambulatory Visit: Payer: Self-pay

## 2018-12-17 NOTE — Telephone Encounter (Signed)
[id number]  Certified mail [phone]

## 2018-12-19 ENCOUNTER — Other Ambulatory Visit: Payer: Self-pay

## 2018-12-19 ENCOUNTER — Emergency Department (HOSPITAL_COMMUNITY): Payer: Medicare Other

## 2018-12-19 ENCOUNTER — Encounter (HOSPITAL_COMMUNITY): Payer: Self-pay | Admitting: Emergency Medicine

## 2018-12-19 ENCOUNTER — Emergency Department (HOSPITAL_COMMUNITY)
Admission: EM | Admit: 2018-12-19 | Discharge: 2018-12-19 | Disposition: A | Discharge status: Home / Self Care | Payer: Medicare Other | Attending: Emergency Medicine | Admitting: Emergency Medicine

## 2018-12-19 DIAGNOSIS — J45909 Unspecified asthma, uncomplicated: Secondary | ICD-10-CM | POA: Insufficient documentation

## 2018-12-19 DIAGNOSIS — M79602 Pain in left arm: Secondary | ICD-10-CM | POA: Diagnosis present

## 2018-12-19 DIAGNOSIS — I10 Essential (primary) hypertension: Secondary | ICD-10-CM | POA: Insufficient documentation

## 2018-12-19 DIAGNOSIS — M5412 Radiculopathy, cervical region: Secondary | ICD-10-CM | POA: Diagnosis not present

## 2018-12-19 DIAGNOSIS — Z87891 Personal history of nicotine dependence: Secondary | ICD-10-CM | POA: Diagnosis not present

## 2018-12-19 DIAGNOSIS — Z79899 Other long term (current) drug therapy: Secondary | ICD-10-CM | POA: Insufficient documentation

## 2018-12-19 MED ORDER — CYCLOBENZAPRINE HCL 10 MG PO TABS
10.0000 mg | ORAL_TABLET | Freq: Once | ORAL | Status: AC
  Administered 2018-12-19: 10 mg via ORAL
  Filled 2018-12-19: qty 1

## 2018-12-19 MED ORDER — OXYCODONE HCL 5 MG PO TABS
5.0000 mg | ORAL_TABLET | Freq: Once | ORAL | Status: AC
  Administered 2018-12-19: 5 mg via ORAL
  Filled 2018-12-19: qty 1

## 2018-12-19 NOTE — ED Triage Notes (Signed)
Patient c/o left arm pain . Per patient pain starts in shoulder and goes into elbow with numbness in hand. Patient states pain increases with movement of neck. Denies any known injury. Patient states arm was fine when she went to sleep, woke this morning with pain. Denies any chest pain or shortness of breath.

## 2018-12-19 NOTE — Discharge Instructions (Addendum)
You may alternate ice and heat to your neck and shoulder.  Try small, gentle range of motion exercises of your shoulder.  Take your muscle relaxer as directed.  Follow-up with your primary doctor for recheck on Monday.  Return to ER for any worsening symptoms.  Is also important that you take your blood pressure medication daily.

## 2018-12-19 NOTE — ED Provider Notes (Signed)
[facility] EMERGENCY DEPARTMENT Provider Note   CSN: [id number] Arrival date & time: 12/19/18  [id number]     History Chief Complaint  Patient presents with  . Arm Pain    Emily [name] is a 38 y.o. female.  HPI      Emily [name] is a 38 y.o. female with past medical history of chronic pain, asthma, degenerative disc disease of the cervical spine, MS and hypertension She presents to the Emergency Department complaining of pain to her left arm and neck secondary to a fall out of bed.  She describes a sharp pain around her left scapula, left neck and into her left arm to the level of her elbow.  Symptoms are associated with numbness and tingling of her left arm.  Incident occurred on the evening prior to arrival.  She denies any head injury or LOC.  Pain is worsened with movement of her neck to the left.  She denies headache, dizziness, vomiting, visual changes and chest pain.  Past Medical History:  Diagnosis Date  . ADHD   . Anxiety   . Asthma as a child  . Chronic back pain   . Chronic pain   . Conversion disorder   . DDD (degenerative disc disease), cervical   . Depression   . External hemorrhoid   . Fatty liver   . GERD (gastroesophageal reflux disease)   . History of kidney stones   . HTN (hypertension) 02/27/2015  . JC virus antibody positive   . Migraines   . MS (multiple sclerosis) ([place])    06-2006  . Neuropathy   . OA (osteoarthritis)   . Ovarian cyst   . Panic attack   . Perforated bowel ([place]) 2009   colostomy bag for 3 motnhs  . 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 PTSD (post-traumatic stress disorder)   . S/P emergency C-section   . Seasonal allergies   . Urinary urgency   . Vertigo   . Wears contact lenses   . Wears glasses     Patient Active Problem List   Diagnosis Date Noted  . S/P emergency C-section   . Pseudoseizures   . MS (multiple sclerosis) ([place])   . ADHD   . Anxiety   . Chronic pain   . Panic  attack   . Open wound of abdominal wall, anterior, complicated, sequela [id number]  . Postoperative wound infection 09/20/2018  . Acute postoperative pain 08/25/2018  . History of substance use disorder 07/19/2018  . Nausea without vomiting 04/13/2018  . Nausea with vomiting 03/23/2018  . Pharmacologic therapy 03/16/2018  . Disorder of skeletal system 03/16/2018  . Problems influencing health status 03/16/2018  . Abnormal MRI, cervical spine (03/06/17) 03/16/2018  . Morbid obesity with BMI of 45.0-49.9, adult ([place]) 03/16/2018  . Chronic pain of both knees 02/18/2018  . Cervical facet hypertrophy 04/29/2017  . Cervical facet syndrome (Bilateral) (R>L) 04/29/2017  . Spondylosis without myelopathy or radiculopathy, cervical region 04/29/2017  . Spondylosis without myelopathy or radiculopathy, lumbosacral region 03/04/2017  . Cervicogenic headache 02/10/2017  . Occipital headache 02/10/2017  . Trigger point with back pain (Left) 02/10/2017  . History of fainting (vasovagal) 02/10/2017  . History of vasovagal syncope 02/10/2017  . Acute left-sided low back pain without sciatica 02/10/2017  . Neurogenic pain 01/23/2017  . Chronic musculoskeletal pain 12/02/2016  . Cervical radiculitis (Bilateral) 11/12/2016  . Chronic upper back pain (Primary Area of Pain) (Bilateral) (R>L)  11/01/2016  . Chronic hand pain [facility] Area of Pain) (Bilateral) (L>R) 11/01/2016  . Chronic wrist pain [facility] Area of Pain) (Bilateral) (L>R) 11/01/2016  . Chronic foot/toes pain (Fourth Area of Pain) (Bilateral) (R>L) 11/01/2016  . Chronic upper extremity pain (Secondary Area of Pain) (Bilateral) (L>R) 11/01/2016  . Long term prescription benzodiazepine use 11/01/2016  . 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 Cervical foraminal stenosis (Bilateral) (C6-7) 11/01/2016  . 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 DDD (degenerative disc disease), thoracic 11/01/2016  . DDD  (degenerative disc disease), lumbar 11/01/2016  . Lumbar facet arthropathy 11/01/2016  . Lumbar facet syndrome (Bilateral) (R>L) 11/01/2016  . Vitamin D deficiency 10/28/2016  . Opiate use 07/23/2016  . Chronic neck pain (Primary Area of Pain) (Bilateral) (R>L) 07/23/2016  . Chronic low back pain (Secondary Area of Pain) (Bilateral) (L>R) 07/23/2016  . Chronic knee pain (Fifth Area of Pain) (Left) 07/23/2016  . Midline thoracic back pain 07/23/2016  . Chronic lower extremity pain (Bilateral) 07/23/2016  . Incarcerated incisional hernia 02/16/2016  . Incisional hernia 02/15/2016  . Carpal tunnel syndrome [facility] Area of Pain) (Bilateral) (L>R) 11/13/2015  . Substance abuse ([place]) 09/07/2015  . Paresthesia 08/02/2015  . Chronic pain syndrome 08/02/2015  . Postpartum endometritis 07/21/2015  . PTSD (post-traumatic stress disorder) 05/16/2015  . Conversion disorder with seizures or convulsions 05/16/2015  . 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 GAD (generalized anxiety disorder) 05/16/2015  . Difficult intravenous access 05/08/2015  . Right optic neuritis 04/27/2015  . Conversion disorder with attacks or seizures, persistent, with psychological stressor 03/28/2015  . Migraine with aura and without status migrainosus, not intractable 03/28/2015  . Generalized anxiety disorder 03/28/2015  . HTN (hypertension) 02/27/2015  . Perforated bowel ([place]) 02/27/2015  . Depression 12/27/2014  . Multiple sclerosis ([place]) 12/27/2014  . Migraine 12/27/2014  . Seizure disorder ([place]) 12/27/2014  . Smoker 12/27/2014  . Obesity 12/27/2014  . DDD (degenerative disc disease), cervical 07/28/2014  . Major depressive disorder, single episode 11/02/2013  . Laryngopharyngeal reflux 10/25/2010  . Tonsillitis 10/25/2010    Past Surgical History:  Procedure Laterality Date  . ABDOMINAL SURGERY    . APPENDECTOMY    . BOWEL RESECTION  01/2007    with colostomy  . CESAREAN SECTION N/A 07/12/2015   Procedure: CESAREAN SECTION;  Surgeon: Guss Bunde, MD;  Location: [place];  Service: Obstetrics;  Laterality: N/A;  . COLONOSCOPY WITH PROPOFOL N/A 01/29/2017   Procedure: COLONOSCOPY WITH PROPOFOL;  Surgeon: Ileana Roup, MD;  Location: [facility];  Service: General;  Laterality: N/A;  . COLOSTOMY CLOSURE  04/2007  . EVALUATION UNDER ANESTHESIA WITH HEMORRHOIDECTOMY N/A 06/11/2018   Procedure: ANORECTAL EXAM UNDER ANESTHESIA WITH HEMORRHOIDECTOMY;  Surgeon: Ileana Roup, MD;  Location: [place];  Service: General;  Laterality: N/A;  . EXCISIONAL HEMORRHOIDECTOMY    . EXTREMITY CYST EXCISION  1994   right leg  . HERNIA REPAIR    . INCISIONAL HERNIA REPAIR N/A 02/16/2016   Procedure: HERNIA REPAIR INCISIONAL;  Surgeon: Fanny Skates, MD;  Location: [facility];  Service: General;  Laterality: N/A;  . INSERTION OF MESH  02/16/2016   Procedure: INSERTION OF MESH;  Surgeon: Fanny Skates, MD;  Location: [facility];  Service: General;;  . RADIOLOGY WITH ANESTHESIA N/A 03/06/2017   Procedure: MRI WITH ANESTHESIA OF CERVICAL SPINE WITHOUT CONTRAST, MRI OF LUMBAR SPINE WITHOUT CONTRAST;  Surgeon: Radiologist, Medication,  MD;  Location: [place];  Service: Radiology;  Laterality: N/A;  . RADIOLOGY WITH ANESTHESIA N/A 09/15/2018   Procedure: MRI OF BRAIN WITH AND WITHOUT CONTRAST;  Surgeon: Radiologist, Medication, MD;  Location: [place];  Service: Radiology;  Laterality: N/A;  . RADIOLOGY WITH ANESTHESIA N/A 10/13/2018   Procedure: MRI OF BRAIN WITH AND WITHOUT CONTRAST;  Surgeon: Radiologist, Medication, MD;  Location: [place];  Service: Radiology;  Laterality: N/A;  . RADIOLOGY WITH ANESTHESIA N/A 10/26/2018   Procedure: MRI UNDER ANESTHESIA; HEAD, CERVICAL AND THORASCIC SPINE;  Surgeon: Radiologist, Medication, MD;  Location: [place];  Service: Radiology;  Laterality: N/A;  . SCAR REVISION  01/21/2011   Procedure: SCAR  REVISION;  Surgeon: Hermelinda Dellen;  Location: [place];  Service: Plastics;  Laterality: N/A;  exploration of scar of abdomen and repair of defect  . TOOTH EXTRACTION Left 10/2016  . TRANSRECTAL DRAINAGE OF PELVIC ABSCESS       OB History    Gravida  1   Para  1   Term      Preterm  1   AB      Living  1     SAB      TAB      Ectopic      Multiple  0   Live Births  1           Family History  Problem Relation Age of Onset  . Diabetes Mother   . Hypertension Mother   . Diabetes Father   . Hypertension Father   . Arthritis Father   . Cancer Maternal Grandmother   . Cancer Maternal Grandfather   . Alcohol abuse Neg Hx   . Anxiety disorder Neg Hx   . Bipolar disorder Neg Hx   . Drug abuse Neg Hx   . Depression Neg Hx   . Colon cancer Neg Hx     Social History   Tobacco Use  . Smoking status: Former Smoker    Packs/day: 0.25    Years: 10.00    Pack years: 2.50    Types: Cigarettes    Quit date: 07/12/2015    Years since quitting: 3.4  . Smokeless tobacco: Never Used  Substance Use Topics  . Alcohol use: No    Alcohol/week: 0.0 standard drinks  . Drug use: No    Comment: marinol shows up as THC    Home Medications Prior to Admission medications   Medication Sig Start Date End Date Taking? Authorizing Provider  AIMOVIG 140 MG/ML SOAJ Inject 140 mg into the muscle every 30 (thirty) days.  08/21/18  Yes [provider]  albuterol (PROVENTIL HFA;VENTOLIN HFA) 108 (90 Base) MCG/ACT inhaler Inhale 2 puffs into the lungs every 6 (six) hours as needed for wheezing or shortness of breath. Patient taking differently: Inhale 2 puffs into the lungs every 4 (four) hours as needed for wheezing or shortness of breath.  02/15/15  Yes Teague Bobbye Morton, PA-C  amLODipine (NORVASC) 5 MG tablet Take 5 mg by mouth every morning.  01/23/16  Yes [provider]  atomoxetine (STRATTERA) 40 MG capsule TAKE 1 CAPSULE BY MOUTH EVERY DAY  11/05/18  Yes Charlcie Cradle, MD  busPIRone (BUSPAR) 15 MG tablet Take 1 tablet (15 mg total) by mouth 3 (three) times daily. 09/17/18  Yes Charlcie Cradle, MD  diazepam (VALIUM) 5 MG tablet Take 1 tablet (5 mg total) by mouth daily as needed for anxiety. 10/29/18 10/29/19 Yes Agarwal,  Andria Frames, MD  divalproex (DEPAKOTE) 500 MG DR tablet Take 1 tablet (500 mg total) by mouth at bedtime. 05/05/18  Yes Cameron Sprang, MD  dronabinol (MARINOL) 2.5 MG capsule Take 1 capsule (2.5 mg total) by mouth 3 (three) times daily. 12/01/18  Yes Annitta Needs, NP  EPINEPHrine (EPIPEN) 0.3 mg/0.3 mL SOAJ injection Inject 0.3 mg into the muscle as needed (allergic reaction). Reported on 03/28/2015   Yes [provider]  etonogestrel (NEXPLANON) 68 MG IMPL implant 1 each by Subdermal route once.   Yes [provider]  Fingolimod HCl (GILENYA) 0.5 MG CAPS Take 1 capsule (0.5 mg total) by mouth daily. Patient taking differently: Take 0.5 mg by mouth every morning.  05/05/18  Yes Cameron Sprang, MD  FLUoxetine (PROZAC) 40 MG capsule Take 2 capsules (80 mg total) by mouth every morning. 09/17/18 09/17/19 Yes Charlcie Cradle, MD  haloperidol (HALDOL) 1 MG tablet Take 1 tablet (1 mg total) by mouth 2 (two) times daily. 12/17/18  Yes Charlcie Cradle, MD  lithium carbonate (LITHOBID) 300 MG CR tablet Take 2 tablets (600 mg total) by mouth 2 (two) times daily. 09/17/18  Yes Charlcie Cradle, MD  loratadine (CLARITIN) 10 MG tablet Take 10 mg by mouth daily as needed for allergies.   Yes [provider]  methocarbamol (ROBAXIN) 750 MG tablet Take 1 tablet (750 mg total) by mouth 2 (two) times daily as needed for muscle spasms. 09/21/18 03/20/19 Yes Milinda Pointer, MD  Multiple Vitamins-Minerals (MULTIVITAMIN ADULT PO) Take 1 tablet by mouth daily.    Yes [provider]  NON FORMULARY Take 1 each by mouth at bedtime. CBD Gummy   Yes [provider]  omeprazole (PRILOSEC) 20 MG capsule TAKE  1 CAPSULE (20 MG TOTAL) BY MOUTH 2 (TWO) TIMES DAILY BEFORE A MEAL. 06/18/18  Yes Annitta Needs, NP  oxyCODONE (ROXICODONE) 5 MG immediate release tablet Take 1 tablet (5 mg total) by mouth every 4 (four) hours as needed for severe pain. 12/11/18  Yes Maudie Flakes, MD  pregabalin (LYRICA) 300 MG capsule Take 1 capsule (300 mg total) by mouth at bedtime. 09/21/18 03/20/19 Yes Milinda Pointer, MD  topiramate (TOPAMAX) 100 MG tablet Take 1.5 tablets twice a day Patient taking differently: Take 150 mg by mouth 2 (two) times daily. Take 1.5 tablets twice a day 05/05/18  Yes Cameron Sprang, MD    Allergies    Baclofen, Duloxetine hcl, Gabapentin, Monosodium glutamate, Other, Alprazolam, Amitriptyline, Magnesium salicylate, Modafinil, Rizatriptan, Tizanidine, Vicodin [hydrocodone-acetaminophen], Acetaminophen, Adhesive [tape], Hydrocodone-acetaminophen, Ketorolac tromethamine, Lamotrigine, Nsaids, and Tramadol  Review of Systems   Review of Systems  Constitutional: Negative for chills and fever.  Respiratory: Negative for chest tightness and shortness of breath.   Cardiovascular: Negative for chest pain.  Gastrointestinal: Negative for abdominal pain, diarrhea, nausea and vomiting.  Genitourinary: Negative for difficulty urinating and dysuria.  Musculoskeletal: Positive for arthralgias (Left shoulder and elbow pain) and neck pain. Negative for joint swelling.  Skin: Negative for color change and wound.  Neurological: Positive for numbness (Numbness and tingling of the left arm). Negative for dizziness, syncope and weakness.    Physical Exam Updated Vital Signs BP 105/68 (BP Location: Right Arm)   Pulse 78   Temp 98.2 F (36.8 C) (Oral)   Resp 18   Ht 5\' 5"  (1.651 m)   Wt 114.3 kg   SpO2 97%   BMI 41.93 kg/m   Physical Exam Vitals and nursing note reviewed.  Constitutional:  General: She is not in acute distress.    Appearance: Normal appearance. She is not ill-appearing.  HENT:      Head: Atraumatic.     Mouth/Throat:     Mouth: Mucous membranes are moist.  Eyes:     Extraocular Movements: Extraocular movements intact.     Conjunctiva/sclera: Conjunctivae normal.     Pupils: Pupils are equal, round, and reactive to light.  Cardiovascular:     Rate and Rhythm: Normal rate and regular rhythm.     Pulses: Normal pulses.  Pulmonary:     Effort: Pulmonary effort is normal.     Breath sounds: Normal breath sounds.  Chest:     Chest wall: No tenderness.  Abdominal:     Palpations: Abdomen is soft.     Tenderness: There is no abdominal tenderness.  Musculoskeletal:     Left shoulder: Tenderness present. No swelling.       Arms:     Cervical back: Tenderness present.     Comments: Pt has ttp of the left cervical paraspinal muscles, anterior left shoulder joint and lateral left elbow.  Pain with attempted abduction of the shoulder.  No erythema, edema or excessive warmth of the joints.  Left wrist is non-tender.    Skin:    General: Skin is warm.     Capillary Refill: Capillary refill takes less than 2 seconds.     Findings: No erythema or rash.  Neurological:     General: No focal deficit present.     Mental Status: She is alert.     Sensory: Sensation is intact. No sensory deficit.     Motor: Motor function is intact.     Comments: CN II-XII intact.  Speech clear.       ED Results / Procedures / Treatments   Labs (all labs ordered are listed, but only abnormal results are displayed) Labs Reviewed - No data to display  EKG None  Radiology DG Elbow Complete Left  Result Date: 12/19/2018 CLINICAL DATA:  Fall left shoulder and elbow pain EXAM: LEFT ELBOW - COMPLETE 3+ VIEW COMPARISON:  None FINDINGS: There is no evidence of fracture, dislocation, or joint effusion. There is no evidence of arthropathy or other focal bone abnormality. Soft tissues are unremarkable. IMPRESSION: Negative evaluation of the left elbow. Electronically Signed   By: Zetta Bills M.D.   On: 12/19/2018 11:27   CT Cervical Spine Wo Contrast  Result Date: 12/19/2018 CLINICAL DATA:  Osteoarthritis, fell out of bed left arm numbness and pain. EXAM: CT CERVICAL SPINE WITHOUT CONTRAST TECHNIQUE: Multidetector CT imaging of the cervical spine was performed without intravenous contrast. Multiplanar CT image reconstructions were also generated. COMPARISON:  MR cervical spine 10/26/2018 and CT cervical spine of 09/13/2018 FINDINGS: Alignment: Straightening of normal cervical lordosis with mild reversal of this lordotic curvature at the C5-6 level similar to previous study, marked degenerative change at C5-6, C6-7 and C7 T1. Skull base and vertebrae: No acute fracture. No primary bone lesion or focal pathologic process. Soft tissues and spinal canal: No prevertebral fluid or swelling. No visible canal hematoma. Disc levels: Marked degenerative changes with moderate to marked central canal narrowing at the C5-6 and C6-C7, similar to prior exams. Upper chest: Negative. Other: None IMPRESSION: 1. No acute fracture or malalignment. 2. Advanced degenerative changes of the cervical spine, with moderate to marked central canal narrowing greatest at C5-6 and C6-C7, similar to prior exams. Electronically Signed   By: Jewel Baize.D.  On: 12/19/2018 11:34   DG Shoulder Left  Result Date: 12/19/2018 CLINICAL DATA:  Fall left elbow and shoulder pain. EXAM: LEFT SHOULDER - 2+ VIEW COMPARISON:  09/19/2018 FINDINGS: There is no evidence of fracture or dislocation. There is no evidence of arthropathy or other focal bone abnormality. Soft tissues are unremarkable. IMPRESSION: Negative evaluation of the left shoulder. Electronically Signed   By: Zetta Bills M.D.   On: 12/19/2018 11:25    Procedures Procedures (including critical care time)  Medications Ordered in ED Medications  oxyCODONE (Oxy IR/ROXICODONE) immediate release tablet 5 mg (has no administration in time range)    cyclobenzaprine (FLEXERIL) tablet 10 mg (10 mg Oral Given 12/19/18 1059)  oxyCODONE (Oxy IR/ROXICODONE) immediate release tablet 5 mg (5 mg Oral Given 12/19/18 1059)    ED Course  I have reviewed the triage vital signs and the nursing notes.  Pertinent labs & imaging results that were available during my care of the patient were reviewed by me and considered in my medical decision making (see chart for details).    MDM Rules/Calculators/A&P     CHA2DS2/VAS Stroke Risk Points      N/A >= 2 Points: High Risk  1 - 1.99 Points: Medium Risk  0 Points: Low Risk    A final score could not be computed because of missing components.: Last  Change: N/A     This score determines the patient's risk of having a stroke if the  patient has atrial fibrillation.      This score is not applicable to this patient. Components are not  calculated.                   Patient with left-sided cervical radicular pain secondary to a fall out of bed.  Doubt MS exacerbation.  CT C-spine shows no acute fracture or malalignment with degenerative changes similar to previous exams.  Plain film x-rays of the elbow and shoulder are also reassuring and negative for acute bony injury.  Remains neurovascularly intact.  Patient is requesting additional pain medication.  She has oxycodone at home as well as muscle relaxers.  Prescription for steroid was offered but patient declined.  I do not feel that additional narcotic pain medication is warranted.  Patient appears appropriate for discharge home she agrees to close follow-up with PCP.  Referral information given for orthopedics as well.  Patient is noted to be hypertensive, She denies any associated symptoms.  BP improved on recheck.  Return precautions discussed    Final Clinical Impression(s) / ED Diagnoses Final diagnoses:  Cervical radicular pain    Rx / DC Orders ED Discharge Orders    None       Kem Parkinson, PA-C 12/20/18 1405    Ezequiel Essex, MD 12/21/18 1023

## 2018-12-21 ENCOUNTER — Telehealth: Payer: Self-pay | Admitting: Neurology

## 2018-12-21 ENCOUNTER — Telehealth (HOSPITAL_COMMUNITY): Payer: Self-pay | Admitting: *Deleted

## 2018-12-21 ENCOUNTER — Other Ambulatory Visit (HOSPITAL_COMMUNITY): Payer: Self-pay

## 2018-12-21 MED ORDER — DIAZEPAM 5 MG PO TABS: 5.0000 mg | ORAL_TABLET | Freq: Every day | ORAL | 0 refills | Status: DC | PRN

## 2018-12-21 NOTE — Telephone Encounter (Signed)
C/O legs hurting, muscles are stiff, feet are cold. Continues to walk with cane. 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

## 2018-12-21 NOTE — Telephone Encounter (Signed)
Patient called requesting to speak with a nurse. She said she is having a relapse with her MS. She said her feet are cold and numb and hurting and her arms are bothering her as well.

## 2018-12-21 NOTE — Telephone Encounter (Signed)
Left message informing pt that the leg pain that she is having may be triggered from the fall and to give that more time to improve. Instructed her to get in touch with an eye doctor about loss of vision in the right eye.

## 2018-12-21 NOTE — Telephone Encounter (Signed)
Per Dr. Delice Lesch,   I reviewed her ER visit. Pls see what her concern about MS flare is, if it is numbness/tingling on left arm, and she also reports pain on the left from her fall, then pls reassure her that I reviewed the ER notes and think this is due to her fall, and not from [place].   Left message for pt to return call.

## 2018-12-21 NOTE — Telephone Encounter (Signed)
Patient called in a panic stating that she'll have a seizure without her Diazepam 5mg . I spoke with the pharmacy and she's out. The last time she got it was 11/20/18 and she has no refills. Do you want to send in more or do you want me to? Please let me know what you'd like to do. Also, patient is requesting a letter stating that she has been dismissed because her care has become complicated beyond our means. She stated that the doctor relayed that to her. She also stated that she wants the letter to specify that she was not dismissed because she's a drug addict. Please advise.  Thank you.

## 2018-12-21 NOTE — Telephone Encounter (Signed)
This needs an urgent visit with her eye doctor if she is blind in the right eye. If she suddenly cannot see, this needs either ER eval or urgent eye doctor eval. Thanks

## 2018-12-21 NOTE — Telephone Encounter (Signed)
ADD. From previous phone call: according to message from 12/16/18 it appears that 30 day refills on medications but it is not showing in review of medication list. Please review. Thanks.

## 2018-12-21 NOTE — Telephone Encounter (Signed)
Writer received several messages from pt requesting refill of the Diazepam 5mg . According to provider notes it says pt has been discharged from this practice and that pt is aware of this.

## 2018-12-21 NOTE — Telephone Encounter (Signed)
Patient called and left a message stating she is returning a call to the nurse.

## 2018-12-21 NOTE — Telephone Encounter (Signed)
Spoke with doctor. Sent in #30 0 refills of patient's Diazepam 5mg . She's been dismissed from our practice. Called patient to notify her of the medication refill and reminded her that doctor informed her that further refills would have to go through her primary care and that she needs to reach out to other psychiatrists that doctor gave her . Also, informed patient that dismissal letter has been sent out and that per doctor we will not write any other letters for her.

## 2018-12-22 ENCOUNTER — Telehealth: Payer: Self-pay | Admitting: Neurology

## 2018-12-22 NOTE — Telephone Encounter (Signed)
Received a call from ER Dr. Terrance Mass at [facility] [phone]). Emily [name] is in the ER c/o bilateral leg weakness, she also has back pain. She was given IV Solumedrol. She was not reporting any loss of vision, which was her complaint yesterday. Discussed with Dr. Terrance Mass that she has called office several times concerned about MS flare, however repeat MRIs have been stable with no abnormal enhancement. Agree with Medrol dose pack and f/u with Pain Mgt for back pain (patient was asking for Ketamine).

## 2018-12-23 ENCOUNTER — Ambulatory Visit: Payer: Self-pay | Admitting: General Surgery

## 2018-12-23 DIAGNOSIS — Z98891 History of uterine scar from previous surgery: Secondary | ICD-10-CM | POA: Diagnosis not present

## 2018-12-23 DIAGNOSIS — F419 Anxiety disorder, unspecified: Secondary | ICD-10-CM | POA: Diagnosis not present

## 2018-12-23 DIAGNOSIS — G35 Multiple sclerosis: Secondary | ICD-10-CM | POA: Diagnosis not present

## 2018-12-23 DIAGNOSIS — I10 Essential (primary) hypertension: Secondary | ICD-10-CM | POA: Diagnosis not present

## 2018-12-23 DIAGNOSIS — F329 Major depressive disorder, single episode, unspecified: Secondary | ICD-10-CM | POA: Diagnosis not present

## 2018-12-23 DIAGNOSIS — Z8719 Personal history of other diseases of the digestive system: Secondary | ICD-10-CM | POA: Diagnosis not present

## 2018-12-23 DIAGNOSIS — L7682 Other postprocedural complications of skin and subcutaneous tissue: Secondary | ICD-10-CM | POA: Diagnosis not present

## 2018-12-23 DIAGNOSIS — Z9889 Other specified postprocedural states: Secondary | ICD-10-CM | POA: Diagnosis not present

## 2018-12-23 DIAGNOSIS — F410 Panic disorder [episodic paroxysmal anxiety] without agoraphobia: Secondary | ICD-10-CM | POA: Diagnosis not present

## 2018-12-23 DIAGNOSIS — L089 Local infection of the skin and subcutaneous tissue, unspecified: Secondary | ICD-10-CM | POA: Diagnosis not present

## 2018-12-23 DIAGNOSIS — D013 Carcinoma in situ of anus and anal canal: Secondary | ICD-10-CM | POA: Diagnosis not present

## 2018-12-23 NOTE — H&P (Signed)
History of Present Illness Emily Ok MD; 12/23/2018 2:18 PM) The patient is a 38 year old female who presents with a complaint of chronic draining sinus lower abdominal wall. Patient consented secondary to continued sinus wound infection. Patient is a follow-up from Dr. Dalbert Batman as per below. 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  He states that she's finisher presented biotics. She states that she continued with drainage from the wound. This appears to be fairly serous appearing. She denies any fevers at this time, however has some pain at the site.    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 [name]. Emily [name] was my chaperone throughout the encounter. She has seen Dr. Windle Guard in our office for consideration of weight loss surgery but has not completed that workup She has had hemorrhoidectomy surgery by Dr. Dema Severin in our office which shows high-grade squamous intraepithelial lesion.  Her abdominal surgery history is complicated. She developed a colon perforation while living in [place] state. Possibly due to constipation or steroids. She's had a two-stage resection. In brief Emily [name] in [place] may have done the colostomy reversal. She says Dr. Towanda [name] performed a primary ventral hernia repair several years ago. She's had an emergency C-section. She presented here 2 years ago with a painful bulge in the lower midline. Dr. Donne Hazel saw her in the office in direct admitted her to the hospital. I took her to the operating room and found a reducible incisional hernia in the lower midline. I closed the fascia primarily and placed a piece of mesh as an anterior onlay. She healed. I did see her a time  or 2 with a couple of superficial wound issues but no evidence of infection or sinus tract. I have not seen her since December 2018. She says that in July she was admitted to [facility] by her surgeon, Dr. Juliann Pulse, and spent 3 days thereafter surgery on her lower abdomen. She has like this may have been incision and drainage and packing. She says he drained a cyst in the emergency department. Home health nursing saw her at home. She says she's had drainage ever since that time. CT scan was done recently which shows what looks like a sinus tract from the skin all ligament down to the mesh and a small fluid collection. She doesn't have fever chills or cellulitis. She says it hurts and she is frustrated and tearful and wants to have something done. She says she is currently taking Bactrim  Past history morbid obesity. Multiple sclerosis. Panic attacks anxiety and seizure disorder. Hypertension. 2 surgical resection. Scar revision. Primary ventral hernia repair by Dr. Towanda [name]. Emergency C-section. Urgent onlay mesh repair by me February 16, 2016. Lost to follow-up since December 2018.  Social history she quit tobacco in December 2010. She lives in [place] with her parents and her 30-year-old daughter. She is disabled because of multiple sclerosis.  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 She is not acutely ill and doesn't have a significant soft tissue infection. I told her that I think she will need an operation where the entire sinus tract was excised and all of the infected mesh is excised and it will need to be  packed open heal by secondary intention which may take 3 or 4 months. She knows that I'm retiring. We felt it best that the surgeon to perform her surgery provides follow-up and she agrees. I discussed her case with Dr. Rosendo [name] who has kindly agreed to see her in consultation and assume care. We have made an appointment for her to see Dr.  Rosendo [name] on November 25. In the meantime she will simply keep dry gauze on the drainage and continue to take the Bactrim until it is gone.     Problem List/Past Medical [name], CMA; 12/23/2018 2:00 PM) MULTIPLE SCLEROSIS (G35)  MORBID OBESITY (E66.01)  We discussed the laparoscopic sleeve gastrectomy including technical aspects, the risks of bleeding, infection, pain, scarring, injury to intra-abdominal structures, staple line leak or abscess, chronic abdominal pain or nausea, new onset or worsened GERD, DVT/PE, pneumonia, heart attack, stroke, death, failure to reach weight loss goals and weight regain, hernia. Discussed the typical pre-, peri-, and postoperative course. Discussed the importance of lifelong behavioral changes to combat the chronic and relapsing disease which is obesity.  I advised that based on her psychiatric and surgical history as well as her history of chronic pain, she is at increased risk of exacerbation of psychiatric issues and ongoing chronic pain. In addition to which her sensitive surgical history slightly increases her risk of intra-abdominal complications. We also discussed risk of worsened GERD. She described complete understanding of all of these issues and remains interested in pursuing reactive surgery.  I will start her on the bariatric pathway and will follow her progress with the dietitians.    Start time: 12pm End time: 1pm  Time spent with the patient: 60 minutes, of which >50% was spent in obtaining information about symptoms, reviewing previous labs, evaluations, and treatments, counseling about condition (please see the discussed topics above), and developing a plan to further investigate it; had a number of questions which I addressed. 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 this is tender and has become increasingly so. she does have n/v. I think this likely just needs to be repaired. I have discussed with Dr Dalbert Batman and she will be directly admitted to [facility]. will repeat her ct scan to reassess this for any change as her symptoms have somewhat changed and she is difficult to examine. decision will be made after imaging. npo overnight SEIZURE DISORDER (G40.909)  BMI 39.0-39.9,ADULT (Z68.39)  HISTORY OF COLOSTOMY REVERSAL (0000000)  COMPLICATED WOUND INFECTION (T14.8XXA)  URINARY FREQUENCY (R35.0)  HISTORY OF EMERGENCY CESAREAN SECTION (Z98.891)  HYPERTENSION, BENIGN (I10)  ANXIETY AND DEPRESSION (F41.9, F32.9)  PANIC ATTACKS (F41.0)   Past Surgical History [name], CMA; 12/23/2018 2:00 PM) Appendectomy  Cesarean Section - 1   Diagnostic Studies History [name], CMA; 12/23/2018 2:00 PM) Colonoscopy  5-10 years ago Mammogram  never Pap Smear  1-5 years ago  Allergies [name], CMA; 12/23/2018 2:00 PM) Amitriptyline HCl *ANTIDEPRESSANTS*  hypertension Baclofen *DERMATOLOGICALS*  Hives, Shortness of breath. Cymbalta *ANTIDEPRESSANTS*  Shortness of breath, Rash. Gabapentin *ANTICONVULSANTS*  Shortness of breath, Rash. MONOSODIUM GLUTAMATE  Anaphylaxis. Vicodin *ANALGESICS - OPIOID*  Hives, Nausea, Vomiting. ALPRAZolam *ANTIANXIETY AGENTS*  Magnesium Salicylate *CHEMICALS*  Hives, Itching. Rizatriptan Benzoate *MIGRAINE PRODUCTS*  Nausea, Vomiting. Tizanidine Comfort Pac *MUSCULOSKELETAL THERAPY AGENTS*  Hives. TraMADol HCl *ANALGESICS - OPIOID*  Adhesive 1"x6yd *MEDICAL DEVICES AND SUPPLIES*  LamoTRIgine *ANTICONVULSANTS*  Rash. Toradol *ANALGESICS - ANTI-INFLAMMATORY*  Nausea.  Medication  History [name], CMA; 12/23/2018 2:01 PM) oxyCODONE HCl (5MG  Tablet, 1 (one) Oral every 12 hours, as needed, Taken starting 12/14/2018) Active. Sulfamethoxazole (500MG  Tablet, Oral) Active. Modafinil (200MG  Tablet, Oral) Active. Vitamin D (Ergocalciferol) (50000UNIT Capsule, Oral) Active. CVS D3 (5000UNIT Capsule, Oral) Active. DiazePAM (5MG  Tablet, Oral) Active. Hydrocodone-Acetaminophen (5-325MG  Tablet, Oral) Active. Divalproex Sodium (500MG  Tablet DR, Oral) Active. Gilenya (0.5MG  Capsule, Oral) Active. Methocarbamol (750MG  Tablet, Oral) Active. Ibuprofen (800MG  Tablet, Oral) Active. Divalproex Sodium (Migraine) (500MG  Tablet ER 24HR, Oral at bedtime) Active. Topiramate (50MG  Tablet, Oral two times daily) Active. SUMAtriptan Succinate (100MG  Tablet, Oral as needed) Active. AmLODIPine Besylate (5MG  Tablet, Oral every morning) Active. BusPIRone HCl (15MG  Tablet, Oral three times daily) Active. piazepam (5mg  daily) Active. FLUoxetine HCl (60MG  Tablet, Oral daily) Active. HydroCHLOROthiazide (25MG  Tablet, Oral daily) Active. Metoclopramide HCl (10MG  Tablet, Oral as needed) Active. Albuterol Sulfate HFA (108 (90 Base)MCG/ACT Aerosol Soln, Inhalation) Active. epi pen  (0.3mg ) Active. RaNITidine HCl (300MG  Tablet, Oral at bedtime) Active. Medications Reconciled  Social History [name], CMA; 12/23/2018 2:00 PM) Alcohol use  Occasional alcohol use. Caffeine use  Carbonated beverages. No drug use  Tobacco use  Former smoker.  Family History [name], CMA; 12/23/2018 2:00 PM) Diabetes Mellitus  Father, Mother. Hypertension  Father, Mother.  Pregnancy / Birth History [name], CMA; 12/23/2018 2:00 PM) Age at menarche  43 years. Contraceptive History  Contraceptive implant. Gravida  1 Maternal age  68-40 Para  1 Regular periods   Other Problems [name], CMA; 12/23/2018 2:00 PM) Anxiety  Disorder  Back Pain  Depression  Gastroesophageal Reflux Disease  Hemorrhoids  High blood pressure  Kidney Stone  Migraine Headache  Other disease, cancer, significant illness  Seizure Disorder  Ventral Hernia Repair   Vitals [name] CMA; 12/23/2018 2:00 PM) 12/23/2018 2:00 PM Weight: 258.38 lb Height: 65in Body Surface Area: 2.21 m Body Mass Index: 43 kg/m  Temp.: 97.48F(Oral)  Pulse: 105 (Regular)  BP: 148/90 (Sitting, Left Arm, Standard)       Physical Exam Emily Ok MD; 12/23/2018 2:19 PM) General Mental Status-Alert. General Appearance-Not in acute distress. Build & Nutrition-Well nourished. Posture-Normal posture. Gait-Normal. Note: Initially tearful tremulous a little bit histrionic. After we got her dressed set her up in the chair she was calm, cooperative and in no distress whatsoever and appropriate.   Head and Neck Head-normocephalic, atraumatic with no lesions or palpable masses. Trachea-midline. Thyroid Gland Characteristics - normal size and consistency and no palpable nodules.  Chest and Lung Exam Chest and lung exam reveals -on auscultation, normal breath sounds, no adventitious sounds and normal vocal resonance.  Cardiovascular Cardiovascular examination reveals -normal heart sounds, regular rate and rhythm with no murmurs and femoral artery auscultation bilaterally reveals normal pulses, no bruits, no thrills.  Abdomen Note: Morbidly obese. None no distention or tenderness. Complex midline scars. Colostomy site on the left side. I'll feel any hernias. There is a draining sinus tract in the lower midline. Clear slightly cloudy fluid with no odor. No bleeding. She tolerated that well. Redressed   Neurologic Neurologic evaluation reveals -alert and oriented x 3 with no impairment of recent or remote memory, normal attention span and ability to  concentrate, normal sensation  and normal coordination.  Musculoskeletal Normal Exam - Bilateral-Upper Extremity Strength Normal and Lower Extremity Strength Normal.    Assessment & Plan Emily Ok MD; [id number] PM) COMPLICATED WOUND INFECTION (T14.8XXA) Impression: Patient is a 38 year old female with chronic wound status post mesh placement 2018. This appears to be chronic draining Sinus.  1. Will proceed to the operating room for exploratory laparotomy, possible removal of mesh, excision of draining sinus. 2. I discussed with her the risks and benefits of the procedure to include but not limited to: Infection, bleeding, damage to structures, possible further fistulas, possible further need for surgery, possible hernias of the future, patient was understanding and wished to proceed. Current Plans You are being scheduled for surgery- Our schedulers will call you.  You should hear from our office's scheduling department within 5 working days about the location, date, and time of surgery. We try to make accommodations for patient's preferences in scheduling surgery, but sometimes the OR schedule or the surgeon's schedule prevents [place] from making those accommodations.  If you have not heard from our office [phone]) in 5 working days, call the office and ask for your surgeon's nurse.  If you have other questions about your diagnosis, plan, or surgery, call the office and ask for your surgeon's nurse.  MULTIPLE SCLEROSIS (G35) MORBID OBESITY (E66.01) Story: We discussed the laparoscopic sleeve gastrectomy including technical aspects, the risks of bleeding, infection, pain, scarring, injury to intra-abdominal structures, staple line leak or abscess, chronic abdominal pain or nausea, new onset or worsened GERD, DVT/PE, pneumonia, heart attack, stroke, death, failure to reach weight loss goals and weight regain, hernia. Discussed the typical pre-, peri-, and postoperative course. Discussed the importance of  lifelong behavioral changes to combat the chronic and relapsing disease which is obesity.  I advised that based on her psychiatric and surgical history as well as her history of chronic pain, she is at increased risk of exacerbation of psychiatric issues and ongoing chronic pain. In addition to which her sensitive surgical history slightly increases her risk of intra-abdominal complications. We also discussed risk of worsened GERD. She described complete understanding of all of these issues and remains interested in pursuing reactive surgery.  I will start her on the bariatric pathway and will follow her progress with the dietitians.    Start time: 12pm End time: 1pm  Time spent with the patient: 60 minutes, of which >50% was spent in obtaining information about symptoms, reviewing previous labs, evaluations, and treatments, counseling about condition (please see the discussed topics above), and developing a plan to further investigate it; had a number of questions which I addressed. 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

## 2018-12-25 ENCOUNTER — Telehealth: Payer: Self-pay | Admitting: Neurology

## 2018-12-25 NOTE — Telephone Encounter (Signed)
Patient called regarding her going to the ER on 12/15 because of the pain and numbness in her legs. She said her Left foot and leg are still painful and her foot is swollen. She is unsure of what is causing it? Please Call. Thank you

## 2018-12-25 NOTE — Telephone Encounter (Signed)
Patient is calling to see if we can prescribe something for pain

## 2018-12-25 NOTE — Telephone Encounter (Signed)
Patient called back to let Dr. Delice Lesch know she is still in pain and also that she does not have a PCP at this time. Thank you

## 2018-12-25 NOTE — Telephone Encounter (Signed)
Sent pt message through [facility] to contact pain management physician.

## 2018-12-25 NOTE — Telephone Encounter (Signed)
I'm really sorry but she has to call her Pain specialist. I cannot prescribe any pain medication

## 2018-12-26 ENCOUNTER — Other Ambulatory Visit: Payer: Self-pay

## 2018-12-26 ENCOUNTER — Emergency Department (HOSPITAL_COMMUNITY)
Admission: EM | Admit: 2018-12-26 | Discharge: 2018-12-26 | Disposition: A | Discharge status: Home / Self Care | Payer: Medicare Other | Attending: Emergency Medicine | Admitting: Emergency Medicine

## 2018-12-26 ENCOUNTER — Emergency Department (HOSPITAL_COMMUNITY): Payer: Medicare Other

## 2018-12-26 ENCOUNTER — Encounter (HOSPITAL_COMMUNITY): Payer: Self-pay

## 2018-12-26 DIAGNOSIS — Z79899 Other long term (current) drug therapy: Secondary | ICD-10-CM | POA: Insufficient documentation

## 2018-12-26 DIAGNOSIS — R1084 Generalized abdominal pain: Secondary | ICD-10-CM | POA: Diagnosis not present

## 2018-12-26 DIAGNOSIS — J45909 Unspecified asthma, uncomplicated: Secondary | ICD-10-CM | POA: Diagnosis not present

## 2018-12-26 DIAGNOSIS — R1030 Lower abdominal pain, unspecified: Secondary | ICD-10-CM | POA: Diagnosis present

## 2018-12-26 DIAGNOSIS — G35 Multiple sclerosis: Secondary | ICD-10-CM | POA: Insufficient documentation

## 2018-12-26 DIAGNOSIS — I10 Essential (primary) hypertension: Secondary | ICD-10-CM | POA: Insufficient documentation

## 2018-12-26 DIAGNOSIS — G40909 Epilepsy, unspecified, not intractable, without status epilepticus: Secondary | ICD-10-CM | POA: Diagnosis not present

## 2018-12-26 DIAGNOSIS — Z87891 Personal history of nicotine dependence: Secondary | ICD-10-CM | POA: Insufficient documentation

## 2018-12-26 DIAGNOSIS — K632 Fistula of intestine: Secondary | ICD-10-CM

## 2018-12-26 LAB — CBC WITH DIFFERENTIAL/PLATELET
Abs Immature Granulocytes: 0.03 10*3/uL (ref 0.00–0.07)
Basophils Absolute: 0 10*3/uL (ref 0.0–0.1)
Basophils Relative: 0 %
Eosinophils Absolute: 0.1 10*3/uL (ref 0.0–0.5)
Eosinophils Relative: 1 %
HCT: 41.9 % (ref 36.0–46.0)
Hemoglobin: 13.7 g/dL (ref 12.0–15.0)
Immature Granulocytes: 0 %
Lymphocytes Relative: 11 %
Lymphs Abs: 0.8 10*3/uL (ref 0.7–4.0)
MCH: 29.1 pg (ref 26.0–34.0)
MCHC: 32.7 g/dL (ref 30.0–36.0)
MCV: 89.1 fL (ref 80.0–100.0)
Monocytes Absolute: 0.5 10*3/uL (ref 0.1–1.0)
Monocytes Relative: 6 %
Neutro Abs: 6.2 10*3/uL (ref 1.7–7.7)
Neutrophils Relative %: 82 %
Platelets: 388 10*3/uL (ref 150–400)
RBC: 4.7 MIL/uL (ref 3.87–5.11)
RDW: 13 % (ref 11.5–15.5)
WBC: 7.7 10*3/uL (ref 4.0–10.5)
nRBC: 0 % (ref 0.0–0.2)

## 2018-12-26 LAB — URINALYSIS, ROUTINE W REFLEX MICROSCOPIC
Bilirubin Urine: NEGATIVE
Glucose, UA: NEGATIVE mg/dL
Hgb urine dipstick: NEGATIVE
Ketones, ur: NEGATIVE mg/dL
Leukocytes,Ua: NEGATIVE
Nitrite: NEGATIVE
Protein, ur: NEGATIVE mg/dL
Specific Gravity, Urine: 1.02 (ref 1.005–1.030)
pH: 5 (ref 5.0–8.0)

## 2018-12-26 LAB — COMPREHENSIVE METABOLIC PANEL
ALT: 18 U/L (ref 0–44)
AST: 17 U/L (ref 15–41)
Albumin: 3.5 g/dL (ref 3.5–5.0)
Alkaline Phosphatase: 86 U/L (ref 38–126)
Anion gap: 7 (ref 5–15)
BUN: 15 mg/dL (ref 6–20)
CO2: 26 mmol/L (ref 22–32)
Calcium: 8.7 mg/dL — ABNORMAL LOW (ref 8.9–10.3)
Chloride: 107 mmol/L (ref 98–111)
Creatinine, Ser: 0.62 mg/dL (ref 0.44–1.00)
GFR calc Af Amer: >60 mL/min (ref >60)
GFR calc non Af Amer: >60 mL/min (ref >60)
Glucose, Bld: 130 mg/dL — ABNORMAL HIGH (ref 70–99)
Potassium: 3 mmol/L — ABNORMAL LOW (ref 3.5–5.1)
Sodium: 140 mmol/L (ref 135–145)
Total Bilirubin: 0.5 mg/dL (ref 0.3–1.2)
Total Protein: 6.8 g/dL (ref 6.5–8.1)

## 2018-12-26 MED ORDER — SODIUM CHLORIDE 0.9 % IV BOLUS
500.0000 mL | Freq: Once | INTRAVENOUS | Status: AC
  Administered 2018-12-26: 500 mL via INTRAVENOUS

## 2018-12-26 MED ORDER — VANCOMYCIN HCL 1500 MG/300ML IV SOLN
1500.0000 mg | Freq: Once | INTRAVENOUS | Status: AC
  Administered 2018-12-26: 1500 mg via INTRAVENOUS
  Filled 2018-12-26: qty 300

## 2018-12-26 MED ORDER — FENTANYL CITRATE (PF) 100 MCG/2ML IJ SOLN
50.0000 ug | Freq: Once | INTRAMUSCULAR | Status: AC
  Administered 2018-12-26: 50 ug via INTRAVENOUS
  Filled 2018-12-26: qty 2

## 2018-12-26 MED ORDER — POTASSIUM CHLORIDE CRYS ER 20 MEQ PO TBCR
40.0000 meq | EXTENDED_RELEASE_TABLET | Freq: Once | ORAL | Status: AC
  Administered 2018-12-26: 40 meq via ORAL
  Filled 2018-12-26: qty 2

## 2018-12-26 MED ORDER — VANCOMYCIN HCL IN DEXTROSE 1-5 GM/200ML-% IV SOLN: 1000.0000 mg | Freq: Two times a day (BID) | INTRAVENOUS | Status: DC

## 2018-12-26 MED ORDER — IOHEXOL 300 MG/ML  SOLN
100.0000 mL | Freq: Once | INTRAMUSCULAR | Status: AC | PRN
  Administered 2018-12-26: 100 mL via INTRAVENOUS

## 2018-12-26 MED ORDER — AMOXICILLIN-POT CLAVULANATE 875-125 MG PO TABS
1.0000 | ORAL_TABLET | Freq: Once | ORAL | Status: AC
  Administered 2018-12-26: 21:00:00 1 via ORAL
  Filled 2018-12-26: qty 1

## 2018-12-26 MED ORDER — AMOXICILLIN-POT CLAVULANATE 875-125 MG PO TABS: 1.0000 | ORAL_TABLET | Freq: Two times a day (BID) | ORAL | 0 refills | Status: DC

## 2018-12-26 NOTE — ED Notes (Addendum)
Pt called out for assistance. Upon entering pt room, pt states she heard a popping noise and that she had drainage from wound. No draining upon assessment. Told pt to continue wiping area if drainage persisted and that I could not stop wound from draining. Handed pt her call light for assistance going further.

## 2018-12-26 NOTE — ED Provider Notes (Addendum)
[place] Provider Note   CSN: [id number] Arrival date & time: 12/26/18  1507     History Chief Complaint  Patient presents with  . Abdominal Pain    Emily [name] is a 38 y.o. female.  Chief complaint abdominal pain.  Patient has a known draining fistual wound in her central lower abdomen secondary to a "infected mesh from previous hernia surgery".  Today there was a popping sensation and increased pus drainage and pain from the wound.  She was evaluated by Dr. Rosendo Gros on 12/23/2018 in the office.  She is scheduled for surgery on 01/17/2018 in [place] with [facility] surgery.  No fever, sweats, chills.  Severity is moderate.  Palpation makes pain worse.        Past Medical History:  Diagnosis Date  . ADHD   . Anxiety   . Asthma as a child  . Chronic back pain   . Chronic pain   . Conversion disorder   . DDD (degenerative disc disease), cervical   . Depression   . External hemorrhoid   . Fatty liver   . GERD (gastroesophageal reflux disease)   . History of kidney stones   . HTN (hypertension) 02/27/2015  . JC virus antibody positive   . Migraines   . MS (multiple sclerosis) ([place])    06-2006  . Neuropathy   . OA (osteoarthritis)   . Ovarian cyst   . Panic attack   . Perforated bowel ([place]) 2009   colostomy bag for 3 motnhs  . 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 PTSD (post-traumatic stress disorder)   . S/P emergency C-section   . Seasonal allergies   . Urinary urgency   . Vertigo   . Wears contact lenses   . Wears glasses     Patient Active Problem List   Diagnosis Date Noted  . S/P emergency C-section   . Pseudoseizures   . MS (multiple sclerosis) ([place])   . ADHD   . Anxiety   . Chronic pain   . Panic attack   . Open wound of abdominal wall, anterior, complicated, sequela [id number]  . Postoperative wound infection 09/20/2018  . Acute postoperative pain 08/25/2018  . History of  substance use disorder 07/19/2018  . Nausea without vomiting 04/13/2018  . Nausea with vomiting 03/23/2018  . Pharmacologic therapy 03/16/2018  . Disorder of skeletal system 03/16/2018  . Problems influencing health status 03/16/2018  . Abnormal MRI, cervical spine (03/06/17) 03/16/2018  . Morbid obesity with BMI of 45.0-49.9, adult ([place]) 03/16/2018  . Chronic pain of both knees 02/18/2018  . Cervical facet hypertrophy 04/29/2017  . Cervical facet syndrome (Bilateral) (R>L) 04/29/2017  . Spondylosis without myelopathy or radiculopathy, cervical region 04/29/2017  . Spondylosis without myelopathy or radiculopathy, lumbosacral region 03/04/2017  . Cervicogenic headache 02/10/2017  . Occipital headache 02/10/2017  . Trigger point with back pain (Left) 02/10/2017  . History of fainting (vasovagal) 02/10/2017  . History of vasovagal syncope 02/10/2017  . Acute left-sided low back pain without sciatica 02/10/2017  . Neurogenic pain 01/23/2017  . Chronic musculoskeletal pain 12/02/2016  . Cervical radiculitis (Bilateral) 11/12/2016  . Chronic upper back pain (Primary Area of Pain) (Bilateral) (R>L) 11/01/2016  . Chronic hand pain [facility] Area of Pain) (Bilateral) (L>R) 11/01/2016  . Chronic wrist pain [facility] Area of Pain) (Bilateral) (L>R) 11/01/2016  . Chronic foot/toes pain (Fourth Area of Pain) (Bilateral) (R>L) 11/01/2016  . Chronic upper  extremity pain (Secondary Area of Pain) (Bilateral) (L>R) 11/01/2016  . Long term prescription benzodiazepine use 11/01/2016  . 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 Cervical foraminal stenosis (Bilateral) (C6-7) 11/01/2016  . 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 DDD (degenerative disc disease), thoracic 11/01/2016  . DDD (degenerative disc disease), lumbar 11/01/2016  . Lumbar facet arthropathy 11/01/2016  . Lumbar facet syndrome (Bilateral) (R>L) 11/01/2016  . Vitamin D deficiency 10/28/2016  . Opiate  use 07/23/2016  . Chronic neck pain (Primary Area of Pain) (Bilateral) (R>L) 07/23/2016  . Chronic low back pain (Secondary Area of Pain) (Bilateral) (L>R) 07/23/2016  . Chronic knee pain (Fifth Area of Pain) (Left) 07/23/2016  . Midline thoracic back pain 07/23/2016  . Chronic lower extremity pain (Bilateral) 07/23/2016  . Incarcerated incisional hernia 02/16/2016  . Incisional hernia 02/15/2016  . Carpal tunnel syndrome [facility] Area of Pain) (Bilateral) (L>R) 11/13/2015  . Substance abuse ([place]) 09/07/2015  . Paresthesia 08/02/2015  . Chronic pain syndrome 08/02/2015  . Postpartum endometritis 07/21/2015  . PTSD (post-traumatic stress disorder) 05/16/2015  . Conversion disorder with seizures or convulsions 05/16/2015  . Severe episode of recurrent major depressive disorder, without psychotic features ([place]) 05/16/2015  . GAD (generalized anxiety disorder) 05/16/2015  . Difficult intravenous access 05/08/2015  . Right optic neuritis 04/27/2015  . Conversion disorder with attacks or seizures, persistent, with psychological stressor 03/28/2015  . Migraine with aura and without status migrainosus, not intractable 03/28/2015  . Generalized anxiety disorder 03/28/2015  . HTN (hypertension) 02/27/2015  . Perforated bowel ([place]) 02/27/2015  . Depression 12/27/2014  . Multiple sclerosis ([place]) 12/27/2014  . Migraine 12/27/2014  . Seizure disorder ([place]) 12/27/2014  . Smoker 12/27/2014  . Obesity 12/27/2014  . DDD (degenerative disc disease), cervical 07/28/2014  . Major depressive disorder, single episode 11/02/2013  . Laryngopharyngeal reflux 10/25/2010  . Tonsillitis 10/25/2010    Past Surgical History:  Procedure Laterality Date  . ABDOMINAL SURGERY    . APPENDECTOMY    . BOWEL RESECTION  01/2007   with colostomy  . CESAREAN SECTION N/A 07/12/2015   Procedure: CESAREAN SECTION;  Surgeon: Guss Bunde, MD;  Location: [place];  Service: Obstetrics;  Laterality: N/A;  .  COLONOSCOPY WITH PROPOFOL N/A 01/29/2017   Procedure: COLONOSCOPY WITH PROPOFOL;  Surgeon: Ileana Roup, MD;  [facility]: [facility];  Service: General;  Laterality: N/A;  . COLOSTOMY CLOSURE  04/2007  . EVALUATION UNDER ANESTHESIA WITH HEMORRHOIDECTOMY N/A 06/11/2018   Procedure: ANORECTAL EXAM UNDER ANESTHESIA WITH HEMORRHOIDECTOMY;  Surgeon: Ileana Roup, MD;  Location: [place];  Service: General;  Laterality: N/A;  . EXCISIONAL HEMORRHOIDECTOMY    . EXTREMITY CYST EXCISION  1994   right leg  . HERNIA REPAIR    . INCISIONAL HERNIA REPAIR N/A 02/16/2016   Procedure: HERNIA REPAIR INCISIONAL;  Surgeon: Fanny Skates, MD;  [facility]: [facility];  Service: General;  Laterality: N/A;  . INSERTION OF MESH  02/16/2016   Procedure: INSERTION OF MESH;  Surgeon: Fanny Skates, MD;  Location: [facility];  Service: General;;  . RADIOLOGY WITH ANESTHESIA N/A 03/06/2017   Procedure: MRI WITH ANESTHESIA OF CERVICAL SPINE WITHOUT CONTRAST, MRI OF LUMBAR SPINE WITHOUT CONTRAST;  Surgeon: Radiologist, Medication, MD;  Location: [place];  Service: Radiology;  Laterality: N/A;  . RADIOLOGY WITH ANESTHESIA N/A 09/15/2018   Procedure: MRI OF BRAIN WITH AND WITHOUT CONTRAST;  Surgeon: Radiologist, Medication, MD;  Location: [place];  Service: Radiology;  Laterality: N/A;  . RADIOLOGY WITH ANESTHESIA N/A 10/13/2018   Procedure: MRI OF BRAIN WITH AND WITHOUT CONTRAST;  Surgeon: Radiologist, Medication, MD;  Location: [place];  Service: Radiology;  Laterality: N/A;  . RADIOLOGY WITH ANESTHESIA N/A 10/26/2018   Procedure: MRI UNDER ANESTHESIA; HEAD, CERVICAL AND THORASCIC SPINE;  Surgeon: Radiologist, Medication, MD;  Location: [place];  Service: Radiology;  Laterality: N/A;  . SCAR REVISION  01/21/2011   Procedure: SCAR REVISION;  Surgeon: Hermelinda Dellen;  Location: [place];  Service: Plastics;  Laterality: N/A;  exploration of scar of abdomen and repair of defect  . TOOTH EXTRACTION  Left 10/2016  . TRANSRECTAL DRAINAGE OF PELVIC ABSCESS       OB History    Gravida  1   Para  1   Term      Preterm  1   AB      Living  1     SAB      TAB      Ectopic      Multiple  0   Live Births  1           Family History  Problem Relation Age of Onset  . Diabetes Mother   . Hypertension Mother   . Diabetes Father   . Hypertension Father   . Arthritis Father   . Cancer Maternal Grandmother   . Cancer Maternal Grandfather   . Alcohol abuse Neg Hx   . Anxiety disorder Neg Hx   . Bipolar disorder Neg Hx   . Drug abuse Neg Hx   . Depression Neg Hx   . Colon cancer Neg Hx     Social History   Tobacco Use  . Smoking status: Former Smoker    Packs/day: 0.25    Years: 10.00    Pack years: 2.50    Types: Cigarettes    Quit date: 07/12/2015    Years since quitting: 3.4  . Smokeless tobacco: Never Used  Substance Use Topics  . Alcohol use: No    Alcohol/week: 0.0 standard drinks  . Drug use: No    Comment: marinol shows up as THC    Home Medications Prior to Admission medications   Medication Sig Start Date End Date Taking? Authorizing Provider  AIMOVIG 140 MG/ML SOAJ Inject 140 mg into the muscle every 30 (thirty) days.  08/21/18   [provider]  albuterol (PROVENTIL HFA;VENTOLIN HFA) 108 (90 Base) MCG/ACT inhaler Inhale 2 puffs into the lungs every 6 (six) hours as needed for wheezing or shortness of breath. Patient taking differently: Inhale 2 puffs into the lungs every 4 (four) hours as needed for wheezing or shortness of breath.  02/15/15   Jaclyn Prime, Collene Leyden, PA-C  amLODipine (NORVASC) 5 MG tablet Take 5 mg by mouth every morning.  01/23/16   [provider]  atomoxetine (STRATTERA) 40 MG capsule TAKE 1 CAPSULE BY MOUTH EVERY DAY 11/05/18   Charlcie Cradle, MD  busPIRone (BUSPAR) 15 MG tablet Take 1 tablet (15 mg total) by mouth 3 (three) times daily. 09/17/18   Charlcie Cradle, MD  diazepam (VALIUM) 5 MG tablet Take 1  tablet (5 mg total) by mouth daily as needed for anxiety. 12/21/18 12/21/19  Charlcie Cradle, MD  divalproex (DEPAKOTE) 500 MG DR tablet Take 1 tablet (500 mg total) by mouth at bedtime. 05/05/18   Cameron Sprang, MD  dronabinol (MARINOL) 2.5 MG capsule Take 1 capsule (2.5 mg total) by mouth 3 (  three) times daily. 12/01/18   Annitta Needs, NP  EPINEPHrine (EPIPEN) 0.3 mg/0.3 mL SOAJ injection Inject 0.3 mg into the muscle as needed (allergic reaction). Reported on 03/28/2015    [provider]  etonogestrel (NEXPLANON) 68 MG IMPL implant 1 each by Subdermal route once.    [provider]  Fingolimod HCl (GILENYA) 0.5 MG CAPS Take 1 capsule (0.5 mg total) by mouth daily. Patient taking differently: Take 0.5 mg by mouth every morning.  05/05/18   Cameron Sprang, MD  FLUoxetine (PROZAC) 40 MG capsule Take 2 capsules (80 mg total) by mouth every morning. 09/17/18 09/17/19  Charlcie Cradle, MD  haloperidol (HALDOL) 1 MG tablet Take 1 tablet (1 mg total) by mouth 2 (two) times daily. 12/17/18   Charlcie Cradle, MD  lithium carbonate (LITHOBID) 300 MG CR tablet Take 2 tablets (600 mg total) by mouth 2 (two) times daily. 09/17/18   Charlcie Cradle, MD  loratadine (CLARITIN) 10 MG tablet Take 10 mg by mouth daily as needed for allergies.    [provider]  methocarbamol (ROBAXIN) 750 MG tablet Take 1 tablet (750 mg total) by mouth 2 (two) times daily as needed for muscle spasms. 09/21/18 03/20/19  Milinda Pointer, MD  Multiple Vitamins-Minerals (MULTIVITAMIN ADULT PO) Take 1 tablet by mouth daily.     [provider]  NON FORMULARY Take 1 each by mouth at bedtime. CBD Gummy    [provider]  omeprazole (PRILOSEC) 20 MG capsule TAKE 1 CAPSULE (20 MG TOTAL) BY MOUTH 2 (TWO) TIMES DAILY BEFORE A MEAL. 06/18/18   Annitta Needs, NP  oxyCODONE (ROXICODONE) 5 MG immediate release tablet Take 1 tablet (5 mg total) by mouth every 4 (four) hours as needed for severe pain.  12/11/18   Maudie Flakes, MD  pregabalin (LYRICA) 300 MG capsule Take 1 capsule (300 mg total) by mouth at bedtime. 09/21/18 03/20/19  Milinda Pointer, MD  topiramate (TOPAMAX) 100 MG tablet Take 1.5 tablets twice a day Patient taking differently: Take 150 mg by mouth 2 (two) times daily. Take 1.5 tablets twice a day 05/05/18   Cameron Sprang, MD    Allergies    Baclofen, Duloxetine hcl, Gabapentin, Monosodium glutamate, Other, Alprazolam, Amitriptyline, Magnesium salicylate, Modafinil, Rizatriptan, Tizanidine, Vicodin [hydrocodone-acetaminophen], Acetaminophen, Adhesive [tape], Hydrocodone-acetaminophen, Ketorolac tromethamine, Lamotrigine, Nsaids, and Tramadol  Review of Systems   Review of Systems  All other systems reviewed and are negative.   Physical Exam Updated Vital Signs BP (!) 145/91 (BP Location: Left Arm)   Pulse 96   Temp 98.3 F (36.8 C) (Oral)   Resp 18   Ht 5\' 5"  (1.651 m)   Wt 72.6 kg   LMP 12/12/2018   SpO2 100%   BMI 26.63 kg/m   Physical Exam Vitals and nursing note reviewed.  Constitutional:      Comments: Elevate bmi  HENT:     Head: Normocephalic and atraumatic.  Eyes:     Conjunctiva/sclera: Conjunctivae normal.  Cardiovascular:     Rate and Rhythm: Normal rate and regular rhythm.  Pulmonary:     Effort: Pulmonary effort is normal.     Breath sounds: Normal breath sounds.  Abdominal:     General: Bowel sounds are normal.     Palpations: Abdomen is soft.     Comments: Small draining hole in lower abdomen approximately 3 mm in diameter.  Tender surrounding wound.  Some pus.  Musculoskeletal:        General: Normal  range of motion.     Cervical back: Neck supple.  Skin:    General: Skin is warm and dry.  Neurological:     General: No focal deficit present.     Mental Status: She is alert and oriented to person, place, and time.  Psychiatric:        Behavior: Behavior normal.     ED Results / Procedures / Treatments   Labs (all labs  ordered are listed, but only abnormal results are displayed) Labs Reviewed  CBC WITH DIFFERENTIAL/PLATELET  COMPREHENSIVE METABOLIC PANEL  URINALYSIS, ROUTINE W REFLEX MICROSCOPIC    EKG None  Radiology No results found.  Procedures Procedures (including critical care time)  Medications Ordered in ED Medications  sodium chloride 0.9 % bolus 500 mL (has no administration in time range)  fentaNYL (SUBLIMAZE) injection 50 mcg (has no administration in time range)    ED Course  I have reviewed the triage vital signs and the nursing notes.  Pertinent labs & imaging results that were available during my care of the patient were reviewed by me and considered in my medical decision making (see chart for details).    MDM Rules/Calculators/A&P                      We will obtain CT abdomen pelvis with contrast to further delineate.  2030: Recheck.  No acute abdomen.  Discussed findings with general surgeon on-call.  Will start Augmentin and patient will follow-up next week with Dr. Rosendo Gros. Final Clinical Impression(s) / ED Diagnoses Final diagnoses:  Generalized abdominal pain    Rx / DC Orders ED Discharge Orders    None       Nat Christen, MD 12/26/18 1725    Nat Christen, MD 12/26/18 2034    Nat Christen, MD 12/26/18 2050

## 2018-12-26 NOTE — Discharge Instructions (Signed)
CT scan shows no acute findings.  I discussed your case with the general surgeon on-call at [facility].  Call the office early next week for a follow-up appointment.  Your surgeon may want to move the date up, but with Covid, this may be difficult.  Prescription for Augmentin sent to your pharmacy.

## 2018-12-26 NOTE — ED Triage Notes (Signed)
Pt states she has been having abdominal pain intermittently. States she has not have bowel movement in the past few days but has increased gas.   Pt presents with a pin sized hole in umbilical region that is draining purulent fluid. Pt states "I can't take it anymore. I'm supposed to have surgery to fix it Jan 11 but it stinks and squirts pus out when I lift my stomach".  Pt states she had a seizure earlier due to family dispute which caused her stress. Pt frequently makes sobbing noises which stops during conversation.

## 2018-12-26 NOTE — Progress Notes (Signed)
Pharmacy Antibiotic Note  Emily [name] is a 38 y.o. female admitted on 12/26/2018 with wound infection.  Pharmacy has been consulted for Vancomycin dosing.  Plan: Vancomycin 1500 mg IV x 1 dose. Vancomycin 1000 mg IV every 12 hours.  Goal trough 15-20 mcg/mL.  Monitor labs, c/s, and vanco level as indicated.  Height: 5\' 5"  (165.1 cm) Weight: 160 lb (72.6 kg) IBW/kg (Calculated) : 57  Temp (24hrs), Avg:98.3 F (36.8 C), Min:98.3 F (36.8 C), Max:98.3 F (36.8 C)  Recent Labs  Lab 12/26/18 1653  WBC 7.7  CREATININE 0.62    Estimated Creatinine Clearance: 93.3 mL/min (by C-G formula based on SCr of 0.62 mg/dL).    Allergies  Allergen Reactions  . Baclofen Hives and Shortness Of Breath  . Duloxetine Hcl Shortness Of Breath and Rash  . Gabapentin Shortness Of Breath and Rash  . Monosodium Glutamate Anaphylaxis  . Other Shortness Of Breath, Rash and Other (See Comments)    MSG, beans (vomiting) MSG causes hives, itching, throat swelling  . Alprazolam Other (See Comments)    Lethargy  . Amitriptyline Other (See Comments) and Hypertension    hypertension  . Magnesium Salicylate Hives and Itching  . Modafinil Other (See Comments)    Seizures   . Rizatriptan Nausea And Vomiting and Other (See Comments)    GI upset, Projectile vomiting GI upset, Projectile vomiting  . Tizanidine Hives    Other reaction(s): GI Upset (intolerance)  . Vicodin [Hydrocodone-Acetaminophen] Hives, Nausea And Vomiting and Other (See Comments)    Projectile vomiting  . Acetaminophen Nausea And Vomiting    Projectile vomiting  . Adhesive [Tape] Other (See Comments)    Skin irritation Paper tape is ok  . Hydrocodone-Acetaminophen Hives and Nausea And Vomiting    Projectile vomiting  . Ketorolac Tromethamine Nausea Only    Toradol   . Lamotrigine Rash  . Nsaids Other (See Comments)    Irritate stomach   . Tramadol Nausea And Vomiting    Other reaction(s): GI Upset (intolerance)     Antimicrobials this admission: Vanco 12/19 >>     Dose adjustments this admission: N/A  Microbiology results: N/A  Thank you for allowing pharmacy to be a part of this patient's care.  Ramond Craver 12/26/2018 5:33 PM

## 2019-01-08 DIAGNOSIS — G8929 Other chronic pain: Secondary | ICD-10-CM | POA: Insufficient documentation

## 2019-01-08 DIAGNOSIS — M5030 Other cervical disc degeneration, unspecified cervical region: Secondary | ICD-10-CM | POA: Insufficient documentation

## 2019-01-08 DIAGNOSIS — Z87891 Personal history of nicotine dependence: Secondary | ICD-10-CM | POA: Insufficient documentation

## 2019-01-11 ENCOUNTER — Encounter (HOSPITAL_COMMUNITY): Payer: Medicare Other

## 2019-01-12 NOTE — Progress Notes (Signed)
[id number] [place], [place] [address] [place] [address] Phone: [phone] Fax: [phone]      Your procedure is scheduled on Monday 01/18/19.  Report to [facility] Main Entrance "A" at 05:30 A.M., and check in at the Admitting office.  Call this number if you have problems the morning of surgery:  [phone]  Call [phone] if you have any questions prior to your surgery date Monday-Friday 8am-4pm    Remember:  Do not eat after midnight the night before your surgery  You may drink clear liquids until 04:30 the morning of your surgery.   Clear liquids allowed are: Water, Non-Citrus Juices (without pulp), Carbonated Beverages, Clear Tea, Black Coffee Only, and Gatorade  Please complete your 2 PRE-SURGERY ENSURES that were provided to you the night before your surgery.  Please complete your 1 PRE-SURGERY ENSURE that was provided to you by 04:30am the morning of surgery.  Please, if able, drink it in one sitting. DO NOT SIP.   Take these medicines the morning of surgery with A SIP OF WATER: Albuterol (Proventil; Ventolin) inhaler - if needed Amlodipine (Norvasc) Diazepam (Valium) - if needed Epipen - if needed Loratadine (Claritin) - if needed Methocarbamol (Robaxin) - if needed Omeprazole (Prilosec)  Topiramate (Topamax)   7 days prior to surgery STOP taking any Diclofenac Sodium (Voltaren) gel, Aspirin (unless otherwise instructed by your surgeon), Aleve, Naproxen, Ibuprofen, Motrin, Advil, Goody's, BC's, all herbal medications, fish oil, and all vitamins.    The Morning of Surgery  Do not wear jewelry, make-up or nail polish.  Do not wear lotions, powders, perfumes, or deodorant  Do not shave 48 hours prior to surgery.   Do not bring valuables to the hospital.  [facility] is not responsible for any belongings or valuables.  If you are a smoker, DO NOT Smoke 24 hours prior to surgery  If you  wear a CPAP at night please bring your mask, tubing, and machine the morning of surgery   Remember that you must have someone to transport you home after your surgery, and remain with you for 24 hours if you are discharged the same day.   Please bring cases for contacts, glasses, hearing aids, dentures or bridgework because it cannot be worn into surgery.    Leave your suitcase in the car.  After surgery it may be brought to your room.  For patients admitted to the hospital, discharge time will be determined by your treatment team.  Patients discharged the day of surgery will not be allowed to drive home.    Special instructions:   [place]- Preparing For Surgery  Before surgery, you can play an important role. Because skin is not sterile, your skin needs to be as free of germs as possible. You can reduce the number of germs on your skin by washing with CHG (chlorahexidine gluconate) Soap before surgery.  CHG is an antiseptic cleaner which kills germs and bonds with the skin to continue killing germs even after washing.    Oral Hygiene is also important to reduce your risk of infection.  Remember - BRUSH YOUR TEETH THE MORNING OF SURGERY WITH YOUR REGULAR TOOTHPASTE  Please do not use if you have an allergy to CHG or antibacterial soaps. If your skin becomes reddened/irritated stop using the CHG.  Do not shave (including legs and underarms) for at least 48 hours prior to first CHG shower. It  is OK to shave your face.  Please follow these instructions carefully.   1. Shower the NIGHT BEFORE SURGERY and the MORNING OF SURGERY with CHG Soap.   2. If you chose to wash your hair, wash your hair first as usual with your normal shampoo.  3. After you shampoo, rinse your hair and body thoroughly to remove the shampoo.  4. Use CHG as you would any other liquid soap. You can apply CHG directly to the skin and wash gently with a scrungie or a clean washcloth.   5. Apply the CHG Soap to  your body ONLY FROM THE NECK DOWN.  Do not use on open wounds or open sores. Avoid contact with your eyes, ears, mouth and genitals (private parts). Wash Face and genitals (private parts)  with your normal soap.   6. Wash thoroughly, paying special attention to the area where your surgery will be performed.  7. Thoroughly rinse your body with warm water from the neck down.  8. DO NOT shower/wash with your normal soap after using and rinsing off the CHG Soap.  9. Pat yourself dry with a CLEAN TOWEL.  10. Wear CLEAN PAJAMAS to bed the night before surgery, wear comfortable clothes the morning of surgery  11. Place CLEAN SHEETS on your bed the night of your first shower and DO NOT SLEEP WITH PETS.    Day of Surgery:  Please shower the morning of surgery with the CHG soap Do not apply any deodorants/lotions. Please wear clean clothes to the hospital/surgery center.   Remember to brush your teeth WITH YOUR REGULAR TOOTHPASTE.   Please read over the following fact sheets that you were given.

## 2019-01-13 ENCOUNTER — Encounter (HOSPITAL_COMMUNITY): Payer: Self-pay | Admitting: Physician Assistant

## 2019-01-13 ENCOUNTER — Encounter (HOSPITAL_COMMUNITY): Payer: Self-pay

## 2019-01-13 ENCOUNTER — Other Ambulatory Visit: Payer: Self-pay

## 2019-01-13 ENCOUNTER — Encounter (HOSPITAL_COMMUNITY)
Admission: RE | Admit: 2019-01-13 | Discharge: 2019-01-13 | Disposition: A | Discharge status: Home / Self Care | Payer: Medicare HMO | Source: Ambulatory Visit | Attending: General Surgery | Admitting: General Surgery

## 2019-01-13 ENCOUNTER — Encounter (HOSPITAL_COMMUNITY): Payer: Self-pay | Admitting: Anesthesiology

## 2019-01-13 DIAGNOSIS — F445 Conversion disorder with seizures or convulsions: Secondary | ICD-10-CM | POA: Insufficient documentation

## 2019-01-13 DIAGNOSIS — Z01812 Encounter for preprocedural laboratory examination: Secondary | ICD-10-CM | POA: Diagnosis present

## 2019-01-13 DIAGNOSIS — G35 Multiple sclerosis: Secondary | ICD-10-CM | POA: Insufficient documentation

## 2019-01-13 DIAGNOSIS — G43909 Migraine, unspecified, not intractable, without status migrainosus: Secondary | ICD-10-CM | POA: Diagnosis not present

## 2019-01-13 DIAGNOSIS — I10 Essential (primary) hypertension: Secondary | ICD-10-CM | POA: Insufficient documentation

## 2019-01-13 HISTORY — DX: Myoneural disorder, unspecified: G70.9

## 2019-01-13 LAB — COMPREHENSIVE METABOLIC PANEL
ALT: 18 U/L (ref 0–44)
AST: 27 U/L (ref 15–41)
Albumin: 3.9 g/dL (ref 3.5–5.0)
Alkaline Phosphatase: 79 U/L (ref 38–126)
Anion gap: 11 (ref 5–15)
BUN: 8 mg/dL (ref 6–20)
CO2: 22 mmol/L (ref 22–32)
Calcium: 8.8 mg/dL — ABNORMAL LOW (ref 8.9–10.3)
Chloride: 108 mmol/L (ref 98–111)
Creatinine, Ser: 0.67 mg/dL (ref 0.44–1.00)
GFR calc Af Amer: >60 mL/min (ref >60)
GFR calc non Af Amer: >60 mL/min (ref >60)
Glucose, Bld: 98 mg/dL (ref 70–99)
Potassium: 3.6 mmol/L (ref 3.5–5.1)
Sodium: 141 mmol/L (ref 135–145)
Total Bilirubin: 0.2 mg/dL — ABNORMAL LOW (ref 0.3–1.2)
Total Protein: 7.4 g/dL (ref 6.5–8.1)

## 2019-01-13 LAB — CBC
HCT: 47 % — ABNORMAL HIGH (ref 36.0–46.0)
Hemoglobin: 15.2 g/dL — ABNORMAL HIGH (ref 12.0–15.0)
MCH: 29.1 pg (ref 26.0–34.0)
MCHC: 32.3 g/dL (ref 30.0–36.0)
MCV: 89.9 fL (ref 80.0–100.0)
Platelets: 459 10*3/uL — ABNORMAL HIGH (ref 150–400)
RBC: 5.23 MIL/uL — ABNORMAL HIGH (ref 3.87–5.11)
RDW: 13.2 % (ref 11.5–15.5)
WBC: 4.3 10*3/uL (ref 4.0–10.5)
nRBC: 0 % (ref 0.0–0.2)

## 2019-01-13 NOTE — Progress Notes (Signed)
Hypertensive during PAT visit   01/13/19 0915 01/13/19 1046  Vitals  Pulse Rate Source Dinamap  --   BP (!) 139/103 (!) 143/108    Patient BP as above. Patient stated she was in 9/10 lower abdomen pain and had not taken any pain medication since December, as her MD would no longer prescribe. Patient stated she took her scheduled Amlodipine, but that her average diastolic pressures are in the 100's. Karoline Caldwell, PA-C notified and instructed patient to notify PCP, as her pressures were high risk for cancellation of sx. Patient stated that she is no longer a patient of Dr. Monico Blitz and that she was "discharged from the practice". Patient still encouraged to contact office and notify them of her pressures.

## 2019-01-13 NOTE — Progress Notes (Signed)
PCP - Dr. Monico Blitz Cardiologist - Denies Neurologist: Dr. Ellouise Newer Psychiatrist: Dr. Charlcie Cradle  PPM/ICD - Denies  Chest x-ray - N/A EKG - 10/25/2018 Stress Test - Denies ECHO - Denies Cardiac Cath - Denies  Sleep Study - 07/27/2018  Patient denies being diabetic.   Blood Thinner Instructions: N/A Aspirin Instructions: N/A  ERAS Protcol - Yes PRE-SURGERY Ensure: 2 the night before surgery, 1 the morning of surgery  COVID TEST- Scheduled 01/14/2019   Coronavirus Screening  Have you experienced the following symptoms:  Cough yes/no: No Fever (>100.57F)  yes/no: No Runny nose yes/no: No Sore throat yes/no: No Difficulty breathing/shortness of breath  yes/no: No  Have you or a family member traveled in the last 14 days and where? yes/no: No   If the patient indicates "YES" to the above questions, their PAT will be rescheduled to limit the exposure to others and, the surgeon will be notified. THE PATIENT WILL NEED TO BE ASYMPTOMATIC FOR 14 DAYS.   If the patient is not experiencing any of these symptoms, the PAT nurse will instruct them to NOT bring anyone with them to their appointment since they may have these symptoms or traveled as well.   Please remind your patients and families that hospital visitation restrictions are in effect and the importance of the restrictions.     Anesthesia review: Yes, last EKG abnormal  Patient denies shortness of breath, fever, cough and chest pain at PAT appointment   All instructions explained to the patient, with a verbal understanding of the material. Patient agrees to go over the instructions while at home for a better understanding. Patient also instructed to self quarantine after being tested for COVID-19. The opportunity to ask questions was provided.

## 2019-01-14 ENCOUNTER — Other Ambulatory Visit (HOSPITAL_COMMUNITY): Payer: Self-pay | Admitting: Psychiatry

## 2019-01-14 ENCOUNTER — Other Ambulatory Visit (HOSPITAL_COMMUNITY)
Admission: RE | Admit: 2019-01-14 | Discharge: 2019-01-14 | Disposition: A | Discharge status: Home / Self Care | Payer: Medicare HMO | Source: Ambulatory Visit | Attending: General Surgery | Admitting: General Surgery

## 2019-01-14 ENCOUNTER — Other Ambulatory Visit: Payer: Self-pay

## 2019-01-14 DIAGNOSIS — F332 Major depressive disorder, recurrent severe without psychotic features: Secondary | ICD-10-CM

## 2019-01-14 DIAGNOSIS — U071 COVID-19: Secondary | ICD-10-CM | POA: Insufficient documentation

## 2019-01-14 DIAGNOSIS — Z01812 Encounter for preprocedural laboratory examination: Secondary | ICD-10-CM | POA: Insufficient documentation

## 2019-01-14 DIAGNOSIS — F445 Conversion disorder with seizures or convulsions: Secondary | ICD-10-CM

## 2019-01-14 LAB — SARS CORONAVIRUS 2 (TAT 6-24 HRS): SARS Coronavirus 2: POSITIVE — AB

## 2019-01-14 NOTE — Anesthesia Preprocedure Evaluation (Deleted)
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   Follows with neurology for hx of migraines, relapsing-remitting MS and stress induced non epileptic psychogenic events. MS stable per neurology note 08/20/18.  Uncontrolled HTN at PAT visit, 143/108. She reportedly did take her amlodipine. PAT RN sent IB message to Dr. Rosendo Gros to advise. Pt was advised to contact PCP due to poor control but she stated she had been dismissed from practice. Still encouraged to call. She understands uncontrolled HTN could be cause for cancellation on DOS.   Proep labs reviewed, unremarkable.  EKG 10/25/18: Normal sinus rhythm. Rate 88. Nonspecific T wave abnormality. No significant change since last tracing.)        Anesthesia Quick Evaluation

## 2019-01-14 NOTE — Progress Notes (Signed)
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   Follows with neurology for hx of migraines,  relapsing-remitting MS and stress induced non epileptic psychogenic events. MS stable per neurology note 08/20/18.  Uncontrolled HTN at PAT visit, 143/108. She reportedly did take her amlodipine. PAT RN sent IB message to Dr. Rosendo Gros to advise. Pt was advised to contact PCP due to poor control but she stated she had been dismissed from practice. Still encouraged to call. She understands uncontrolled HTN could be cause for cancellation on DOS.   Proep labs reviewed, unremarkable.  EKG 10/25/18: Normal sinus rhythm. Rate 88. Nonspecific T wave abnormality. No significant change since last tracing.   Wynonia Musty [facility] Short Stay Center/Anesthesiology Phone [phone] 01/14/2019 11:13 AM

## 2019-01-14 NOTE — Progress Notes (Signed)
[employer] Dr. Rosendo Gros to notify of elevated BP during PAT visit.

## 2019-01-15 ENCOUNTER — Telehealth: Payer: Self-pay | Admitting: Nurse Practitioner

## 2019-01-15 NOTE — Progress Notes (Signed)
Message left with Nira Conn at Dr. Johney Frame office to have his nurse call regarding Covid test result.

## 2019-01-15 NOTE — Telephone Encounter (Signed)
Called to Discuss with patient about Covid symptoms and the use of bamlanivimab, a monoclonal antibody infusion for those with mild to moderate Covid symptoms and at a high risk of hospitalization.     Pt states that symptoms started more than 10 days ago.

## 2019-01-18 ENCOUNTER — Inpatient Hospital Stay (HOSPITAL_COMMUNITY): Admission: RE | Admit: 2019-01-18 | Payer: Medicare Other | Source: Home / Self Care | Admitting: General Surgery

## 2019-01-18 ENCOUNTER — Encounter (HOSPITAL_COMMUNITY): Admission: RE | Payer: Self-pay | Source: Home / Self Care

## 2019-01-18 SURGERY — LAPAROTOMY, EXPLORATORY
Anesthesia: General

## 2019-02-03 ENCOUNTER — Other Ambulatory Visit: Payer: Self-pay

## 2019-02-03 DIAGNOSIS — G35 Multiple sclerosis: Secondary | ICD-10-CM

## 2019-02-03 MED ORDER — GILENYA 0.5 MG PO CAPS: 0.5000 mg | ORAL_CAPSULE | Freq: Every day | ORAL | 3 refills | Status: DC

## 2019-02-09 ENCOUNTER — Telehealth (HOSPITAL_COMMUNITY): Payer: Self-pay | Admitting: *Deleted

## 2019-02-09 NOTE — Telephone Encounter (Addendum)
Writer received message from "Nurse Avanell Shackleton", nurse CM with [facility] asking for an appointment for pt. Pt has been dismissed from this practice and there is no ROI to speak with said provider.

## 2019-02-15 ENCOUNTER — Telehealth: Payer: Self-pay | Admitting: Internal Medicine

## 2019-02-15 NOTE — Telephone Encounter (Signed)
Called pt, gave her phone number to [employer] to reschedule GES. 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

## 2019-02-15 NOTE — Telephone Encounter (Signed)
Please call patient regarding her medications and to reschedule her gastric emptying. [phone]

## 2019-02-15 NOTE — Telephone Encounter (Signed)
Called pt and she doesn't have any concerns about medication at this time. Pt spoke with MB and was given info to r/s apt.

## 2019-02-17 ENCOUNTER — Telehealth: Payer: Self-pay | Admitting: Neurology

## 2019-02-17 NOTE — Telephone Encounter (Signed)
Patient called with concerns she woke up with a painful left arm with tingling.   She said it may just be due to stress as her mom recently passed away from [place].

## 2019-02-19 NOTE — Telephone Encounter (Signed)
Pt called no answer voice mail left for pt to call back 

## 2019-02-19 NOTE — Telephone Encounter (Signed)
With pain and tingling, this is likely related to her neck. Continue f/u with her pain specialist or ortho for the neck pain. Thanks

## 2019-02-19 NOTE — Telephone Encounter (Signed)
Agree with PCP and her plan to contact surgeon. Thanks

## 2019-02-19 NOTE — Telephone Encounter (Signed)
Pain specialist and ortho with not see her right now because of an open wound surgery was going to be in Jan but she test pos for COvid now unsure what to do. Trying to get a sooner appointment with PCP also going to call and see if she can get her surgery rescheduled sooner it was rescheduled for sometime in April.

## 2019-02-22 DIAGNOSIS — F909 Attention-deficit hyperactivity disorder, unspecified type: Secondary | ICD-10-CM | POA: Diagnosis present

## 2019-02-22 DIAGNOSIS — R569 Unspecified convulsions: Secondary | ICD-10-CM | POA: Diagnosis present

## 2019-02-22 DIAGNOSIS — F445 Conversion disorder with seizures or convulsions: Secondary | ICD-10-CM | POA: Diagnosis present

## 2019-02-22 DIAGNOSIS — F419 Anxiety disorder, unspecified: Secondary | ICD-10-CM | POA: Diagnosis present

## 2019-03-15 NOTE — Pre-Procedure Instructions (Signed)
Emily [name]  03/15/2019      [id number] [place] [place] [address] [place] [address] Phone: [phone] Fax: [phone]  [place] [place] [address] Phone: [phone] Fax: [phone]    Your procedure is scheduled on Mar. 12  Report to [facility] entrance A at 7:00 A.M.  Call this number if you have problems the morning of surgery:  [phone]   Remember:  Do not eat  after midnight.  You may drink clear liquids until 6:00.  Clear liquids allowed are:                    Water, Juice (non-citric and without pulp), Carbonated beverages, Clear Tea, Black Coffee only, Plain Jell-O only, Gatorade and Plain Popsicles only           Please complete your PRE-SURGERY ENSURE  That was provided to you, 1 bottle at dinner and 1 bottle at bed time.              Please complete your PRE-SURGERY ENSURE that was provided to you by 6:00the morning of surgery.  Please, if able, drink it in one setting. DO NOT SIP.    Take these medicines the morning of surgery with A SIP OF WATER :             Albuterol inhaler if needed--bring to hospital            Amlodipine (norvasc)            Dronabinol (marinol)            Loratadine (claritin)            Robaxin(methocarbamol) if needed            fingolimod (gilenya)             7 days prior to surgery STOP taking any Aspirin (unless otherwise instructed by your surgeon), Aleve, Naproxen, Ibuprofen, Motrin, Advil, Goody's, BC's, all herbal medications, fish oil, and all vitamins.   Do not wear jewelry, make-up or nail polish.  Do not wear lotions, powders, or perfumes, or deodorant.  Do not shave 48 hours prior to surgery.  Men may shave face and neck.  Do not bring valuables to the hospital.  [facility] is not responsible for any belongings or valuables.  Contacts, dentures or  bridgework may not be worn into surgery.  Leave your suitcase in the car.  After surgery it may be brought to your room.  For patients admitted to the hospital, discharge time will be determined by your treatment team.  Patients discharged the day of surgery will not be allowed to drive home.    Special instructions:  [facility]- Preparing For Surgery  Before surgery, you can play an important role. Because skin is not sterile, your skin needs to be as free of germs as possible. You can reduce the number of germs on your skin by washing with CHG (chlorahexidine gluconate) Soap before surgery.  CHG is an antiseptic cleaner which kills germs and bonds with the skin to continue killing germs even after washing.    Oral Hygiene is also important to reduce your risk of infection.  Remember - BRUSH YOUR TEETH THE MORNING OF SURGERY WITH YOUR REGULAR TOOTHPASTE  Please do not use if you have an allergy to  CHG or antibacterial soaps. If your skin becomes reddened/irritated stop using the CHG.  Do not shave (including legs and underarms) for at least 48 hours prior to first CHG shower. It is OK to shave your face.  Please follow these instructions carefully.   1. Shower the NIGHT BEFORE SURGERY and the MORNING OF SURGERY with CHG.   2. If you chose to wash your hair, wash your hair first as usual with your normal shampoo.  3. After you shampoo, rinse your hair and body thoroughly to remove the shampoo.  4. Use CHG as you would any other liquid soap. You can apply CHG directly to the skin and wash gently with a scrungie or a clean washcloth.   5. Apply the CHG Soap to your body ONLY FROM THE NECK DOWN.  Do not use on open wounds or open sores. Avoid contact with your eyes, ears, mouth and genitals (private parts). Wash Face and genitals (private parts)  with your normal soap.  6. Wash thoroughly, paying special attention to the area where your surgery will be performed.  7. Thoroughly rinse  your body with warm water from the neck down.  8. DO NOT shower/wash with your normal soap after using and rinsing off the CHG Soap.  9. Pat yourself dry with a CLEAN TOWEL.  10. Wear CLEAN PAJAMAS to bed the night before surgery, wear comfortable clothes the morning of surgery  11. Place CLEAN SHEETS on your bed the night of your first shower and DO NOT SLEEP WITH PETS.    Day of Surgery:  Do not apply any deodorants/lotions.  Please wear clean clothes to the hospital/surgery center.   Remember to brush your teeth WITH YOUR REGULAR TOOTHPASTE.    Please read over the following fact sheets that you were given. Coughing and Deep Breathing and Surgical Site Infection Prevention

## 2019-03-16 ENCOUNTER — Other Ambulatory Visit: Payer: Self-pay

## 2019-03-16 ENCOUNTER — Other Ambulatory Visit (HOSPITAL_COMMUNITY)
Admission: RE | Admit: 2019-03-16 | Discharge: 2019-03-16 | Disposition: A | Discharge status: Home / Self Care | Payer: Medicare HMO | Source: Ambulatory Visit | Attending: General Surgery | Admitting: General Surgery

## 2019-03-16 ENCOUNTER — Encounter (HOSPITAL_COMMUNITY)
Admission: RE | Admit: 2019-03-16 | Discharge: 2019-03-16 | Disposition: A | Discharge status: Home / Self Care | Payer: Medicare HMO | Source: Ambulatory Visit | Attending: General Surgery | Admitting: General Surgery

## 2019-03-16 ENCOUNTER — Encounter (HOSPITAL_COMMUNITY): Payer: Self-pay

## 2019-03-16 ENCOUNTER — Other Ambulatory Visit: Payer: Self-pay | Admitting: Pain Medicine

## 2019-03-16 DIAGNOSIS — Z01812 Encounter for preprocedural laboratory examination: Secondary | ICD-10-CM | POA: Insufficient documentation

## 2019-03-16 DIAGNOSIS — M792 Neuralgia and neuritis, unspecified: Secondary | ICD-10-CM

## 2019-03-16 LAB — BASIC METABOLIC PANEL
Anion gap: 11 (ref 5–15)
BUN: 8 mg/dL (ref 6–20)
CO2: 22 mmol/L (ref 22–32)
Calcium: 9.1 mg/dL (ref 8.9–10.3)
Chloride: 108 mmol/L (ref 98–111)
Creatinine, Ser: 0.8 mg/dL (ref 0.44–1.00)
GFR calc Af Amer: >60 mL/min (ref >60)
GFR calc non Af Amer: >60 mL/min (ref >60)
Glucose, Bld: 118 mg/dL — ABNORMAL HIGH (ref 70–99)
Potassium: 3.4 mmol/L — ABNORMAL LOW (ref 3.5–5.1)
Sodium: 141 mmol/L (ref 135–145)

## 2019-03-16 LAB — CBC
HCT: 43.2 % (ref 36.0–46.0)
Hemoglobin: 14.2 g/dL (ref 12.0–15.0)
MCH: 28.6 pg (ref 26.0–34.0)
MCHC: 32.9 g/dL (ref 30.0–36.0)
MCV: 87.1 fL (ref 80.0–100.0)
Platelets: 429 10*3/uL — ABNORMAL HIGH (ref 150–400)
RBC: 4.96 MIL/uL (ref 3.87–5.11)
RDW: 13.2 % (ref 11.5–15.5)
WBC: 7.4 10*3/uL (ref 4.0–10.5)
nRBC: 0 % (ref 0.0–0.2)

## 2019-03-16 NOTE — Progress Notes (Signed)
PCP - Dr. Oswald Hillock Cardiologist - denies  PPM/ICD - N/A Device Orders -N/A  Rep Notified - N/A  Chest x-ray - N/A EKG - 10/25/18 Stress Test - denies ECHO - denies Cardiac Cath - denies  Sleep Study - 12/20/17-negative study CPAP - denies  Blood Thinner Instructions: N/A Aspirin Instructions:N/A  ERAS Protcol -Protocol implemented and instructions given to patient. PRE-SURGERY Ensure or G2- Pt was previously cancelled in 1/21 d/t being postitive for covid-19. Pt still had her ensures from last PAT appt. Pt was not given an additional 3 drinks. Per order, pt is to drink one at supper, one at bedtime and one the morning of surgery 3 hours prior.   COVID TEST- Pt will not need re-testing d/t being positive on 01/14/2019. Appt will be cancelled. Pt is aware.    Anesthesia review: No  Patient denies shortness of breath, fever, cough and chest pain at PAT appointment   All instructions explained to the patient, with a verbal understanding of the material. Patient agrees to go over the instructions while at home for a better understanding. Patient also instructed to self quarantine after being tested for COVID-19. The opportunity to ask questions was provided.   Coronavirus Screening  Have you experienced the following symptoms:  Cough yes/no: No Fever (>100.27F)  yes/no: No Runny nose yes/no: No Sore throat yes/no: No Difficulty breathing/shortness of breath  yes/no: No  Have you or a family member traveled in the last 14 days and where? yes/no: No   If the patient indicates "YES" to the above questions, their PAT will be rescheduled to limit the exposure to others and, the surgeon will be notified. THE PATIENT WILL NEED TO BE ASYMPTOMATIC FOR 14 DAYS.   If the patient is not experiencing any of these symptoms, the PAT nurse will instruct them to NOT bring anyone with them to their appointment since they may have these symptoms or traveled as well.   Please remind your  patients and families that hospital visitation restrictions are in effect and the importance of the restrictions.

## 2019-03-18 NOTE — H&P (Signed)
History of Present Illness Emily Ok MD; 12/23/2018 2:18 PM)  The patient is a 39 year old female who presents with a complaint of chronic draining sinus lower abdominal wall. Patient consented secondary to continued sinus wound infection. Patient is a follow-up from Dr. Dalbert [name] as per below.  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  He states that she's finisher presented biotics. She states that she continued with drainage from the wound. This appears to be fairly serous appearing. She denies any fevers at this time, however has some pain at the site.  ------------------------------  This is a 39 year old female who is sent to [place] by her surgeon in [place] to manage a chronic draining sinus tract in her lower abdominal wall. Her PCP is going to be Emily [name]. Emily [name] was my chaperone throughout the encounter.  She has seen Emily [name] in our office for consideration of weight loss surgery but has not completed that workup  She has had hemorrhoidectomy surgery by Emily [name] in our office which shows high-grade squamous intraepithelial lesion.  Her abdominal surgery history is complicated. She developed a colon perforation while living in [place] state. Possibly due to constipation or steroids. She's had a two-stage resection. In brief Emily [name] in Emily [name] may have done the colostomy reversal. She says Emily [name] performed a primary ventral hernia repair several years ago. She's had an emergency C-section.  She presented here 2 years ago with a painful bulge in the lower midline. Emily [name] saw her in the office in direct admitted her to the hospital. I took her to the operating room and found a reducible incisional hernia in the lower midline. I closed the fascia primarily and placed a piece of mesh as an anterior onlay. She healed.  I did see her a time or 2 with a  couple of superficial wound issues but no evidence of infection or sinus tract. I have not seen her since December 2018. She says that in July she was admitted to [name] by her surgeon, Emily [name], and spent 3 days thereafter surgery on her lower abdomen. She has like this may have been incision and drainage and packing. She says he drained a cyst in the emergency department. Home health nursing saw her at home. She says she's had drainage ever since that time. CT scan was done recently which shows what looks like a sinus tract from the skin all ligament down to the mesh and a small fluid collection. She doesn't have fever chills or cellulitis. She says it hurts and she is frustrated and tearful and wants to have something done. She says she is currently taking Bactrim  Past history morbid obesity. Multiple sclerosis. Panic attacks anxiety and seizure disorder. Hypertension. 2 surgical resection. Scar revision. Primary ventral hernia repair by Emily [name]. Emergency C-section. Urgent onlay mesh repair by me February 16, 2016. Lost to follow-up since December 2018.  Social history she quit tobacco in December 2010. She lives in [name] with her parents and her 87-year-old daughter. She is disabled because of multiple sclerosis.  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 She is not acutely ill and doesn't have a significant soft tissue infection. I told her that I think she will need an operation where the entire sinus tract was excised and all of the infected mesh is excised and  it will need to be packed open heal by secondary intention which may take 3 or 4 months. She knows that I'm retiring. We felt it best that the surgeon to perform her surgery provides follow-up and she agrees.  I discussed her case with Emily [name] who has kindly agreed to see her in consultation and assume care. We have made an appointment for her to see Emily [name] on November 25.  In the meantime  she will simply keep dry gauze on the drainage and continue to take the Bactrim until it is gone.  Problem List/Past Medical Emily [name], CMA; 12/23/2018 2:00 PM)  MULTIPLE SCLEROSIS (G35)  MORBID OBESITY (E66.01) We discussed the laparoscopic sleeve gastrectomy including technical aspects, the risks of bleeding, infection, pain, scarring, injury to intra-abdominal structures, staple line leak or abscess, chronic abdominal pain or nausea, new onset or worsened GERD, DVT/PE, pneumonia, heart attack, stroke, death, failure to reach weight loss goals and weight regain, hernia. Discussed the typical pre-, peri-, and postoperative course. Discussed the importance of lifelong behavioral changes to combat the chronic and relapsing disease which is obesity.  I advised that based on her psychiatric and surgical history as well as her history of chronic pain, she is at increased risk of exacerbation of psychiatric issues and ongoing chronic pain. In addition to which her sensitive surgical history slightly increases her risk of intra-abdominal complications. We also discussed risk of worsened GERD. She described complete understanding of all of these issues and remains interested in pursuing reactive surgery.  I will start her on the bariatric pathway and will follow her progress with the dietitians.  Start time: 12pm  End time: 1pm  Time spent with the patient: 60 minutes, of which >50% was spent in obtaining information about symptoms, reviewing previous labs, evaluations, and treatments, counseling about condition (please see the discussed topics above), and developing a plan to further investigate it; had a number of questions which I addressed.  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 this is tender and has become increasingly so. she does have n/v. I think this likely just needs to be repaired. I have discussed with Dr Emily [name] and she will be directly admitted to [facility]. will repeat her ct scan to reassess this for any change as her symptoms have somewhat changed and she is difficult to examine. decision will be made after imaging. npo overnight  SEIZURE DISORDER (G40.909)  BMI 39.0-39.9,ADULT (Z68.39)  HISTORY OF COLOSTOMY REVERSAL (0000000)  COMPLICATED WOUND INFECTION (T14.8XXA)  URINARY FREQUENCY (R35.0)  HISTORY OF EMERGENCY CESAREAN SECTION (Z98.891)  HYPERTENSION, BENIGN (I10)  ANXIETY AND DEPRESSION (F41.9, F32.9)  PANIC ATTACKS (F41.0)  Past Surgical History Emily [name], CMA; 12/23/2018 2:00 PM)  Appendectomy  Cesarean Section - 1  Diagnostic Studies History Emily [name], CMA; 12/23/2018 2:00 PM)  Colonoscopy 5-10 years ago  Mammogram never  Pap Smear 1-5 years ago  Allergies Emily [name], CMA; 12/23/2018 2:00 PM)  Amitriptyline HCl *ANTIDEPRESSANTS* hypertension  Baclofen *DERMATOLOGICALS* Hives, Shortness of breath.  Cymbalta *ANTIDEPRESSANTS* Shortness of breath, Rash.  Gabapentin *ANTICONVULSANTS* Shortness of breath, Rash.  MONOSODIUM GLUTAMATE Anaphylaxis.  Vicodin *ANALGESICS - OPIOID* Hives, Nausea, Vomiting.  ALPRAZolam *ANTIANXIETY AGENTS*  Magnesium Salicylate *CHEMICALS* Hives, Itching.  Rizatriptan Benzoate *MIGRAINE PRODUCTS* Nausea, Vomiting.  Tizanidine Comfort Pac *MUSCULOSKELETAL THERAPY AGENTS* Hives.  TraMADol HCl *ANALGESICS - OPIOID*  Adhesive 1"x6yd *MEDICAL DEVICES AND SUPPLIES*  LamoTRIgine *ANTICONVULSANTS* Rash.  Toradol *ANALGESICS - ANTI-INFLAMMATORY* Nausea.  Medication History Emily [name], CMA; 12/23/2018 2:01 PM)  oxyCODONE HCl (5MG  Tablet, 1  (one) Oral every 12 hours, as needed, Taken starting 12/14/2018) Active.  Sulfamethoxazole (500MG  Tablet, Oral) Active.  Modafinil (200MG  Tablet, Oral) Active.  Vitamin D (Ergocalciferol) (50000UNIT Capsule, Oral) Active.  [employer] D3 (5000UNIT Capsule, Oral) Active.  DiazePAM (5MG  Tablet, Oral) Active.  Hydrocodone-Acetaminophen (5-325MG  Tablet, Oral) Active.  Divalproex Sodium (500MG  Tablet DR, Oral) Active.  Gilenya (0.5MG  Capsule, Oral) Active.  Methocarbamol (750MG  Tablet, Oral) Active.  Ibuprofen (800MG  Tablet, Oral) Active.  Divalproex Sodium (Migraine) (500MG  Tablet ER 24HR, Oral at bedtime) Active.  Topiramate (50MG  Tablet, Oral two times daily) Active.  SUMAtriptan Succinate (100MG  Tablet, Oral as needed) Active.  AmLODIPine Besylate (5MG  Tablet, Oral every morning) Active.  BusPIRone HCl (15MG  Tablet, Oral three times daily) Active.  piazepam (5mg  daily) Active.  FLUoxetine HCl (60MG  Tablet, Oral daily) Active.  HydroCHLOROthiazide (25MG  Tablet, Oral daily) Active.  Metoclopramide HCl (10MG  Tablet, Oral as needed) Active.  Albuterol Sulfate HFA (108 (90 Base)MCG/ACT Aerosol Soln, Inhalation) Active.  epi pen (0.3mg ) Active.  RaNITidine HCl (300MG  Tablet, Oral at bedtime) Active.  Medications Reconciled  Social History Emily [name], CMA; 12/23/2018 2:00 PM)  Alcohol use Occasional alcohol use.  Caffeine use Carbonated beverages.  No drug use  Tobacco use Former smoker.  Family History Emily [name], CMA; 12/23/2018 2:00 PM)  Diabetes Mellitus Father, Mother.  Hypertension Father, Mother.  Pregnancy / Birth History Emily [name], CMA; 12/23/2018 2:00 PM)  Age at menarche 42 years.  Contraceptive History Contraceptive implant.  Gravida 1  Maternal age 49-40  Para 1  Regular periods  Other Problems Emily [name], CMA; 12/23/2018 2:00 PM)  Anxiety Disorder  Back Pain  Depression  Gastroesophageal Reflux Disease  Hemorrhoids  High blood pressure   Kidney Stone  Migraine Headache  Other disease, cancer, significant illness  Seizure Disorder  Ventral Hernia Repair  Vitals Emily [name] CMA; 12/23/2018 2:00 PM)  12/23/2018 2:00 PM  Weight: 258.38 lb Height: 65 in  Body Surface Area: 2.21 m Body Mass Index: 43 kg/m  Temp.: 97.5 F (Oral) [name]: 105 (Regular)  BP: 148/90 (Sitting, Left Arm, Standard)  Physical Exam Emily Ok MD; 12/23/2018 2:19 PM)  General  Mental Status - Alert.  General Appearance - Not in acute distress.  Build & Nutrition - Well nourished.  Posture - Normal posture.  Gait - Normal.  Note: Initially tearful tremulous a little bit histrionic. After we got her dressed set her up in the chair she was calm, cooperative and in no distress whatsoever and appropriate.  Head and Neck  Head - normocephalic, atraumatic with no lesions or palpable masses.  Trachea - midline.  Thyroid  Gland Characteristics - normal size and consistency and no palpable nodules.  Chest and Lung Exam  Chest and lung exam reveals - on auscultation, normal breath sounds, no adventitious sounds and normal vocal resonance.  Cardiovascular  Cardiovascular examination reveals - normal heart sounds, regular rate and rhythm with no murmurs and femoral artery auscultation bilaterally reveals normal pulses, no bruits, no thrills.  Abdomen  Note: Morbidly obese. None no distention or tenderness. Complex midline scars. Colostomy site on the  left side. I'll feel any hernias. There is a draining sinus tract in the lower midline. Clear slightly cloudy fluid with no odor. No bleeding. She tolerated that well. Redressed  Neurologic  Neurologic evaluation reveals - alert and oriented x 3 with no impairment of recent or remote memory, normal attention span and ability to concentrate, normal sensation and normal coordination.  Musculoskeletal  Normal Exam - Bilateral - Upper Extremity Strength Normal and Lower Extremity Strength Normal.   Assessment & Plan Emily Ok MD; [id number] PM)  COMPLICATED WOUND INFECTION (T14.8XXA)  Impression: Patient is a 39 year old female with chronic wound status post mesh placement 2018. This appears to be chronic draining Sinus.  1. Will proceed to the operating room for exploratory laparotomy, possible removal of mesh, excision of draining sinus.  2. I discussed with her the risks and benefits of the procedure to include but not limited to: Infection, bleeding, damage to structures, possible further fistulas, possible further need for surgery, possible hernias of the future, patient was understanding and wished to proceed.  Current Plans  You are being scheduled for surgery - Our schedulers will call you.  You should hear from our office's scheduling department within 5 working days about the location, date, and time of surgery. We try to make accommodations for patient's preferences in scheduling surgery, but sometimes the OR schedule or the surgeon's schedule prevents [place] from making those accommodations.  If you have not heard from our office [phone]) in 5 working days, call the office and ask for your surgeon's nurse.  If you have other questions about your diagnosis, plan, or surgery, call the office and ask for your surgeon's nurse.  MULTIPLE SCLEROSIS (G35)  MORBID OBESITY (E66.01)  Story: We discussed the laparoscopic sleeve gastrectomy including technical aspects, the risks of bleeding, infection, pain, scarring, injury to intra-abdominal structures, staple line leak or abscess, chronic abdominal pain or nausea, new onset or worsened GERD, DVT/PE, pneumonia, heart attack, stroke, death, failure to reach weight loss goals and weight regain, hernia. Discussed the typical pre-, peri-, and postoperative course. Discussed the importance of lifelong behavioral changes to combat the chronic and relapsing disease which is obesity.  I advised that based on her psychiatric and surgical  history as well as her history of chronic pain, she is at increased risk of exacerbation of psychiatric issues and ongoing chronic pain. In addition to which her sensitive surgical history slightly increases her risk of intra-abdominal complications. We also discussed risk of worsened GERD. She described complete understanding of all of these issues and remains interested in pursuing reactive surgery.  I will start her on the bariatric pathway and will follow her progress with the dietitians.  Start time: 12pm  End time: 1pm  Time spent with the patient: 60 minutes, of which >50% was spent in obtaining information about symptoms, reviewing previous labs, evaluations, and treatments, counseling about condition (please see the discussed topics above), and developing a plan to further investigate it; had a number of questions which I addressed.  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

## 2019-03-18 NOTE — Anesthesia Preprocedure Evaluation (Addendum)
Anesthesia Evaluation  Patient identified by MRN, date of birth, ID band Patient awake    Reviewed: Allergy & Precautions, NPO status , Patient's Chart, lab work & pertinent test results  History of Anesthesia Complications Negative for: history of anesthetic complications  Airway Mallampati: II  TM Distance: >3 FB Neck ROM: Full    Dental  (+) Missing, Dental Advisory Given   Pulmonary asthma , former smoker (quit 2017),  01/14/2019 SARS coronavirus positive   breath sounds clear to auscultation       Cardiovascular hypertension, Pt. on medications (-) angina Rhythm:Regular Rate:Normal     Neuro/Psych  Headaches, Seizures -,  PSYCHIATRIC DISORDERS (PTSD) Anxiety Depression MS Jacob-Creutzfeld    GI/Hepatic Neg liver ROS, GERD  Controlled and Medicated,S/p multiple abdominal surgeries, now with probable infected mesh   Endo/Other  Morbid obesity  Renal/GU negative Renal ROS     Musculoskeletal   Abdominal (+) + obese,   Peds  Hematology negative hematology ROS (+)   Anesthesia Other Findings   Reproductive/Obstetrics                            Anesthesia Physical Anesthesia Plan  ASA: III  Anesthesia Plan: General   Post-op Pain Management:    Induction: Intravenous  PONV Risk Score and Plan: 3 and Ondansetron, Dexamethasone and Scopolamine patch - Pre-op  Airway Management Planned: Oral ETT  Additional Equipment: None  Intra-op Plan:   Post-operative Plan: Possible Post-op intubation/ventilation  Informed Consent: I have reviewed the patients History and Physical, chart, labs and discussed the procedure including the risks, benefits and alternatives for the proposed anesthesia with the patient or authorized representative who has indicated his/her understanding and acceptance.     Dental advisory given  Plan Discussed with: CRNA and Surgeon  Anesthesia Plan Comments:         Anesthesia Quick Evaluation

## 2019-03-19 ENCOUNTER — Inpatient Hospital Stay (HOSPITAL_COMMUNITY): Payer: Medicare HMO | Admitting: Certified Registered Nurse Anesthetist

## 2019-03-19 ENCOUNTER — Encounter (HOSPITAL_COMMUNITY): Payer: Self-pay | Admitting: General Surgery

## 2019-03-19 ENCOUNTER — Observation Stay (HOSPITAL_COMMUNITY)
Admission: RE | Admit: 2019-03-19 | Discharge: 2019-03-20 | Disposition: A | Discharge status: Home / Self Care | Payer: Medicare HMO | Attending: General Surgery | Admitting: General Surgery

## 2019-03-19 ENCOUNTER — Encounter (HOSPITAL_COMMUNITY)
Admission: RE | Disposition: A | Discharge status: Home / Self Care | Payer: Self-pay | Source: Home / Self Care | Attending: General Surgery

## 2019-03-19 ENCOUNTER — Other Ambulatory Visit: Payer: Self-pay

## 2019-03-19 DIAGNOSIS — Z79899 Other long term (current) drug therapy: Secondary | ICD-10-CM | POA: Diagnosis not present

## 2019-03-19 DIAGNOSIS — M4802 Spinal stenosis, cervical region: Secondary | ICD-10-CM | POA: Diagnosis not present

## 2019-03-19 DIAGNOSIS — G40909 Epilepsy, unspecified, not intractable, without status epilepticus: Secondary | ICD-10-CM | POA: Insufficient documentation

## 2019-03-19 DIAGNOSIS — K219 Gastro-esophageal reflux disease without esophagitis: Secondary | ICD-10-CM | POA: Diagnosis not present

## 2019-03-19 DIAGNOSIS — G894 Chronic pain syndrome: Secondary | ICD-10-CM | POA: Insufficient documentation

## 2019-03-19 DIAGNOSIS — Z6841 Body Mass Index (BMI) 40.0 and over, adult: Secondary | ICD-10-CM | POA: Diagnosis not present

## 2019-03-19 DIAGNOSIS — F329 Major depressive disorder, single episode, unspecified: Secondary | ICD-10-CM | POA: Insufficient documentation

## 2019-03-19 DIAGNOSIS — Z885 Allergy status to narcotic agent status: Secondary | ICD-10-CM | POA: Insufficient documentation

## 2019-03-19 DIAGNOSIS — M5030 Other cervical disc degeneration, unspecified cervical region: Secondary | ICD-10-CM | POA: Insufficient documentation

## 2019-03-19 DIAGNOSIS — T8183XA Persistent postprocedural fistula, initial encounter: Secondary | ICD-10-CM | POA: Insufficient documentation

## 2019-03-19 DIAGNOSIS — M5136 Other intervertebral disc degeneration, lumbar region: Secondary | ICD-10-CM | POA: Diagnosis not present

## 2019-03-19 DIAGNOSIS — I10 Essential (primary) hypertension: Secondary | ICD-10-CM | POA: Insufficient documentation

## 2019-03-19 DIAGNOSIS — G43909 Migraine, unspecified, not intractable, without status migrainosus: Secondary | ICD-10-CM | POA: Diagnosis not present

## 2019-03-19 DIAGNOSIS — Z886 Allergy status to analgesic agent status: Secondary | ICD-10-CM | POA: Insufficient documentation

## 2019-03-19 DIAGNOSIS — Y838 Other surgical procedures as the cause of abnormal reaction of the patient, or of later complication, without mention of misadventure at the time of the procedure: Secondary | ICD-10-CM | POA: Diagnosis not present

## 2019-03-19 DIAGNOSIS — Z888 Allergy status to other drugs, medicaments and biological substances status: Secondary | ICD-10-CM | POA: Insufficient documentation

## 2019-03-19 DIAGNOSIS — T8579XD Infection and inflammatory reaction due to other internal prosthetic devices, implants and grafts, subsequent encounter: Secondary | ICD-10-CM

## 2019-03-19 DIAGNOSIS — F411 Generalized anxiety disorder: Secondary | ICD-10-CM | POA: Insufficient documentation

## 2019-03-19 DIAGNOSIS — G35 Multiple sclerosis: Secondary | ICD-10-CM | POA: Insufficient documentation

## 2019-03-19 DIAGNOSIS — Z8616 Personal history of COVID-19: Secondary | ICD-10-CM | POA: Diagnosis not present

## 2019-03-19 DIAGNOSIS — E559 Vitamin D deficiency, unspecified: Secondary | ICD-10-CM | POA: Diagnosis not present

## 2019-03-19 DIAGNOSIS — T8579XA Infection and inflammatory reaction due to other internal prosthetic devices, implants and grafts, initial encounter: Secondary | ICD-10-CM | POA: Diagnosis not present

## 2019-03-19 DIAGNOSIS — Z87892 Personal history of anaphylaxis: Secondary | ICD-10-CM | POA: Diagnosis not present

## 2019-03-19 DIAGNOSIS — F4310 Post-traumatic stress disorder, unspecified: Secondary | ICD-10-CM | POA: Insufficient documentation

## 2019-03-19 HISTORY — PX: OTHER SURGICAL HISTORY: SHX169

## 2019-03-19 HISTORY — PX: LAPAROTOMY: SHX154

## 2019-03-19 LAB — POCT PREGNANCY, URINE: Preg Test, Ur: NEGATIVE

## 2019-03-19 SURGERY — LAPAROTOMY, EXPLORATORY
Anesthesia: General

## 2019-03-19 MED ORDER — PROPOFOL 10 MG/ML IV BOLUS
INTRAVENOUS | Status: DC | PRN
  Administered 2019-03-19: 200 mg via INTRAVENOUS

## 2019-03-19 MED ORDER — OXYCODONE-ACETAMINOPHEN 5-325 MG PO TABS
1.0000 | ORAL_TABLET | ORAL | Status: DC | PRN
  Administered 2019-03-19 – 2019-03-20 (×4): 2 via ORAL
  Filled 2019-03-19 (×4): qty 2

## 2019-03-19 MED ORDER — FENTANYL CITRATE (PF) 100 MCG/2ML IJ SOLN
INTRAMUSCULAR | Status: AC
  Filled 2019-03-19: qty 2

## 2019-03-19 MED ORDER — MIDAZOLAM HCL 2 MG/2ML IJ SOLN
0.5000 mg | Freq: Once | INTRAMUSCULAR | Status: AC | PRN
  Administered 2019-03-19: 2 mg via INTRAVENOUS

## 2019-03-19 MED ORDER — LIDOCAINE 2% (20 MG/ML) 5 ML SYRINGE
INTRAMUSCULAR | Status: AC
  Filled 2019-03-19: qty 5

## 2019-03-19 MED ORDER — PROMETHAZINE HCL 25 MG/ML IJ SOLN: 6.2500 mg | INTRAMUSCULAR | Status: DC | PRN

## 2019-03-19 MED ORDER — ROCURONIUM BROMIDE 10 MG/ML (PF) SYRINGE
PREFILLED_SYRINGE | INTRAVENOUS | Status: AC
  Filled 2019-03-19: qty 10

## 2019-03-19 MED ORDER — ETONOGESTREL 68 MG ~~LOC~~ IMPL: 1.0000 | DRUG_IMPLANT | Freq: Once | SUBCUTANEOUS | Status: DC

## 2019-03-19 MED ORDER — DIVALPROEX SODIUM 500 MG PO DR TAB
500.0000 mg | DELAYED_RELEASE_TABLET | Freq: Every day | ORAL | Status: DC
  Administered 2019-03-19: 500 mg via ORAL
  Filled 2019-03-19: qty 1

## 2019-03-19 MED ORDER — MIDAZOLAM HCL 2 MG/2ML IJ SOLN
INTRAMUSCULAR | Status: AC
  Filled 2019-03-19: qty 2

## 2019-03-19 MED ORDER — HYDROMORPHONE HCL 1 MG/ML IJ SOLN
INTRAMUSCULAR | Status: AC
  Filled 2019-03-19: qty 1

## 2019-03-19 MED ORDER — SUCCINYLCHOLINE CHLORIDE 200 MG/10ML IV SOSY
PREFILLED_SYRINGE | INTRAVENOUS | Status: DC | PRN
  Administered 2019-03-19: 100 mg via INTRAVENOUS

## 2019-03-19 MED ORDER — CHLORHEXIDINE GLUCONATE CLOTH 2 % EX PADS: 6.0000 | MEDICATED_PAD | Freq: Once | CUTANEOUS | Status: DC

## 2019-03-19 MED ORDER — FENTANYL CITRATE (PF) 250 MCG/5ML IJ SOLN
INTRAMUSCULAR | Status: DC | PRN
  Administered 2019-03-19 (×3): 50 ug via INTRAVENOUS
  Administered 2019-03-19: 150 ug via INTRAVENOUS
  Administered 2019-03-19 (×2): 50 ug via INTRAVENOUS

## 2019-03-19 MED ORDER — ERENUMAB-AOOE 140 MG/ML ~~LOC~~ SOAJ: 140.0000 mg | SUBCUTANEOUS | Status: DC

## 2019-03-19 MED ORDER — ENSURE PRE-SURGERY PO LIQD: 592.0000 mL | Freq: Once | ORAL | Status: DC

## 2019-03-19 MED ORDER — FINGOLIMOD HCL 0.5 MG PO CAPS
0.5000 mg | ORAL_CAPSULE | Freq: Every day | ORAL | Status: DC
  Administered 2019-03-20: 0.5 mg via ORAL
  Filled 2019-03-19 (×2): qty 1

## 2019-03-19 MED ORDER — PHENYLEPHRINE HCL-NACL 10-0.9 MG/250ML-% IV SOLN
INTRAVENOUS | Status: DC | PRN
  Administered 2019-03-19: 25 ug/min via INTRAVENOUS

## 2019-03-19 MED ORDER — ONDANSETRON HCL 4 MG/2ML IJ SOLN: 4.0000 mg | Freq: Four times a day (QID) | INTRAMUSCULAR | Status: DC | PRN

## 2019-03-19 MED ORDER — CELECOXIB 200 MG PO CAPS
200.0000 mg | ORAL_CAPSULE | ORAL | Status: DC
  Filled 2019-03-19: qty 1

## 2019-03-19 MED ORDER — METHOCARBAMOL 750 MG PO TABS
750.0000 mg | ORAL_TABLET | Freq: Two times a day (BID) | ORAL | Status: DC | PRN
  Administered 2019-03-19 – 2019-03-20 (×2): 750 mg via ORAL
  Filled 2019-03-19 (×2): qty 1

## 2019-03-19 MED ORDER — ENSURE PRE-SURGERY PO LIQD: 296.0000 mL | Freq: Once | ORAL | Status: DC

## 2019-03-19 MED ORDER — ACETAMINOPHEN 10 MG/ML IV SOLN
INTRAVENOUS | Status: AC
  Filled 2019-03-19: qty 100

## 2019-03-19 MED ORDER — SUCCINYLCHOLINE CHLORIDE 200 MG/10ML IV SOSY
PREFILLED_SYRINGE | INTRAVENOUS | Status: AC
  Filled 2019-03-19: qty 10

## 2019-03-19 MED ORDER — 0.9 % SODIUM CHLORIDE (POUR BTL) OPTIME
TOPICAL | Status: DC | PRN
  Administered 2019-03-19: 2000 mL

## 2019-03-19 MED ORDER — FENTANYL CITRATE (PF) 250 MCG/5ML IJ SOLN
INTRAMUSCULAR | Status: AC
  Filled 2019-03-19: qty 5

## 2019-03-19 MED ORDER — PREGABALIN 100 MG PO CAPS
300.0000 mg | ORAL_CAPSULE | Freq: Every day | ORAL | Status: DC
  Administered 2019-03-19: 300 mg via ORAL
  Filled 2019-03-19: qty 3

## 2019-03-19 MED ORDER — LORATADINE 10 MG PO TABS: 10.0000 mg | ORAL_TABLET | Freq: Every day | ORAL | Status: DC | PRN

## 2019-03-19 MED ORDER — HYDROMORPHONE HCL 1 MG/ML IJ SOLN
1.0000 mg | INTRAMUSCULAR | Status: DC | PRN
  Administered 2019-03-19 (×2): 1 mg via INTRAVENOUS
  Filled 2019-03-19: qty 1

## 2019-03-19 MED ORDER — MIDAZOLAM HCL 2 MG/2ML IJ SOLN
INTRAMUSCULAR | Status: DC | PRN
  Administered 2019-03-19: 2 mg via INTRAVENOUS

## 2019-03-19 MED ORDER — ROCURONIUM BROMIDE 10 MG/ML (PF) SYRINGE
PREFILLED_SYRINGE | INTRAVENOUS | Status: DC | PRN
  Administered 2019-03-19: 50 mg via INTRAVENOUS

## 2019-03-19 MED ORDER — HYDROMORPHONE HCL 1 MG/ML IJ SOLN
1.0000 mg | INTRAMUSCULAR | Status: DC | PRN
  Administered 2019-03-20 (×2): 1 mg via INTRAVENOUS
  Filled 2019-03-19 (×2): qty 1

## 2019-03-19 MED ORDER — MEPERIDINE HCL 25 MG/ML IJ SOLN: 6.2500 mg | INTRAMUSCULAR | Status: DC | PRN

## 2019-03-19 MED ORDER — AMLODIPINE BESYLATE 5 MG PO TABS
5.0000 mg | ORAL_TABLET | Freq: Every day | ORAL | Status: DC
  Administered 2019-03-19 – 2019-03-20 (×2): 5 mg via ORAL
  Filled 2019-03-19 (×2): qty 1

## 2019-03-19 MED ORDER — ACETAMINOPHEN 10 MG/ML IV SOLN
INTRAVENOUS | Status: DC | PRN
  Administered 2019-03-19: 1000 mg via INTRAVENOUS

## 2019-03-19 MED ORDER — PROPOFOL 10 MG/ML IV BOLUS
INTRAVENOUS | Status: AC
  Filled 2019-03-19: qty 20

## 2019-03-19 MED ORDER — DEXAMETHASONE SODIUM PHOSPHATE 10 MG/ML IJ SOLN
INTRAMUSCULAR | Status: AC
  Filled 2019-03-19: qty 1

## 2019-03-19 MED ORDER — ONDANSETRON HCL 4 MG/2ML IJ SOLN
INTRAMUSCULAR | Status: DC | PRN
  Administered 2019-03-19: 4 mg via INTRAVENOUS

## 2019-03-19 MED ORDER — CEFAZOLIN SODIUM-DEXTROSE 2-4 GM/100ML-% IV SOLN
2.0000 g | INTRAVENOUS | Status: AC
  Administered 2019-03-19: 2 g via INTRAVENOUS
  Filled 2019-03-19: qty 100

## 2019-03-19 MED ORDER — ONDANSETRON 4 MG PO TBDP: 4.0000 mg | ORAL_TABLET | Freq: Four times a day (QID) | ORAL | Status: DC | PRN

## 2019-03-19 MED ORDER — ENOXAPARIN SODIUM 40 MG/0.4ML ~~LOC~~ SOLN
40.0000 mg | SUBCUTANEOUS | Status: DC
  Administered 2019-03-20: 40 mg via SUBCUTANEOUS
  Filled 2019-03-19: qty 0.4

## 2019-03-19 MED ORDER — DICLOFENAC SODIUM 1 % EX GEL
1.0000 "application " | Freq: Two times a day (BID) | CUTANEOUS | Status: DC | PRN
  Filled 2019-03-19: qty 100

## 2019-03-19 MED ORDER — DEXAMETHASONE SODIUM PHOSPHATE 10 MG/ML IJ SOLN
INTRAMUSCULAR | Status: DC | PRN
  Administered 2019-03-19: 5 mg via INTRAVENOUS

## 2019-03-19 MED ORDER — LIDOCAINE 2% (20 MG/ML) 5 ML SYRINGE
INTRAMUSCULAR | Status: DC | PRN
  Administered 2019-03-19: 40 mg via INTRAVENOUS

## 2019-03-19 MED ORDER — DEXTROSE-NACL 5-0.9 % IV SOLN: INTRAVENOUS | Status: DC

## 2019-03-19 MED ORDER — SCOPOLAMINE 1 MG/3DAYS TD PT72
1.0000 | MEDICATED_PATCH | Freq: Once | TRANSDERMAL | Status: DC
  Administered 2019-03-19: 1.5 mg via TRANSDERMAL
  Filled 2019-03-19: qty 1

## 2019-03-19 MED ORDER — DRONABINOL 2.5 MG PO CAPS
2.5000 mg | ORAL_CAPSULE | Freq: Three times a day (TID) | ORAL | Status: DC
  Administered 2019-03-19 – 2019-03-20 (×4): 2.5 mg via ORAL
  Filled 2019-03-19 (×4): qty 1

## 2019-03-19 MED ORDER — ONDANSETRON HCL 4 MG/2ML IJ SOLN
INTRAMUSCULAR | Status: AC
  Filled 2019-03-19: qty 2

## 2019-03-19 MED ORDER — LACTATED RINGERS IV SOLN: INTRAVENOUS | Status: DC | PRN

## 2019-03-19 MED ORDER — HYDROMORPHONE HCL 1 MG/ML IJ SOLN
0.2500 mg | INTRAMUSCULAR | Status: DC | PRN
  Administered 2019-03-19 (×4): 0.5 mg via INTRAVENOUS

## 2019-03-19 MED ORDER — LACTATED RINGERS IV SOLN: INTRAVENOUS | Status: DC

## 2019-03-19 MED ORDER — ACETAMINOPHEN 500 MG PO TABS
1000.0000 mg | ORAL_TABLET | ORAL | Status: DC
  Filled 2019-03-19: qty 2

## 2019-03-19 MED ORDER — DIPHENHYDRAMINE HCL 25 MG PO TABS
50.0000 mg | ORAL_TABLET | Freq: Every day | ORAL | Status: DC
  Administered 2019-03-19: 50 mg via ORAL
  Filled 2019-03-19 (×3): qty 2

## 2019-03-19 MED ORDER — SUGAMMADEX SODIUM 200 MG/2ML IV SOLN
INTRAVENOUS | Status: DC | PRN
  Administered 2019-03-19: 200 mg via INTRAVENOUS

## 2019-03-19 SURGICAL SUPPLY — 45 items
APL PRP STRL LF DISP 70% ISPRP (MISCELLANEOUS) ×1
BINDER ABDOMINAL 12 XL 75-84 (SOFTGOODS) ×1 IMPLANT
BLADE CLIPPER SURG (BLADE) IMPLANT
BNDG GAUZE ELAST 4 BULKY (GAUZE/BANDAGES/DRESSINGS) ×1 IMPLANT
CANISTER SUCT 3000ML PPV (MISCELLANEOUS) ×2 IMPLANT
CHLORAPREP W/TINT 26 (MISCELLANEOUS) ×2 IMPLANT
COVER SURGICAL LIGHT HANDLE (MISCELLANEOUS) ×2 IMPLANT
DRAPE LAPAROSCOPIC ABDOMINAL (DRAPES) ×2 IMPLANT
DRAPE SURG 17X11 SM STRL (DRAPES) ×1 IMPLANT
DRAPE WARM FLUID 44X44 (DRAPES) ×2 IMPLANT
DRSG OPSITE POSTOP 4X10 (GAUZE/BANDAGES/DRESSINGS) IMPLANT
DRSG OPSITE POSTOP 4X8 (GAUZE/BANDAGES/DRESSINGS) IMPLANT
DRSG PAD ABDOMINAL 8X10 ST (GAUZE/BANDAGES/DRESSINGS) ×1 IMPLANT
ELECT BLADE 6.5 EXT (BLADE) IMPLANT
ELECT CAUTERY BLADE 6.4 (BLADE) ×2 IMPLANT
ELECT REM PT RETURN 9FT ADLT (ELECTROSURGICAL) ×2
ELECTRODE REM PT RTRN 9FT ADLT (ELECTROSURGICAL) ×1 IMPLANT
GLOVE BIO SURGEON STRL SZ7.5 (GLOVE) ×2 IMPLANT
GLOVE BIOGEL PI IND STRL 8 (GLOVE) ×1 IMPLANT
GLOVE BIOGEL PI INDICATOR 8 (GLOVE) ×1
GOWN STRL REUS W/ TWL LRG LVL3 (GOWN DISPOSABLE) ×1 IMPLANT
GOWN STRL REUS W/ TWL XL LVL3 (GOWN DISPOSABLE) ×1 IMPLANT
GOWN STRL REUS W/TWL LRG LVL3 (GOWN DISPOSABLE) ×2
GOWN STRL REUS W/TWL XL LVL3 (GOWN DISPOSABLE) ×2
HANDLE SUCTION POOLE (INSTRUMENTS) ×1 IMPLANT
KIT BASIN OR (CUSTOM PROCEDURE TRAY) ×2 IMPLANT
KIT TURNOVER KIT B (KITS) ×2 IMPLANT
LIGASURE IMPACT 36 18CM CVD LR (INSTRUMENTS) IMPLANT
NS IRRIG 1000ML POUR BTL (IV SOLUTION) ×4 IMPLANT
PACK GENERAL/GYN (CUSTOM PROCEDURE TRAY) ×2 IMPLANT
PAD ARMBOARD 7.5X6 YLW CONV (MISCELLANEOUS) ×2 IMPLANT
PENCIL SMOKE EVACUATOR (MISCELLANEOUS) ×2 IMPLANT
SPECIMEN JAR LARGE (MISCELLANEOUS) IMPLANT
SPONGE LAP 18X18 RF (DISPOSABLE) IMPLANT
STAPLER VISISTAT 35W (STAPLE) ×2 IMPLANT
SUCTION POOLE HANDLE (INSTRUMENTS) ×2
SUT NOVA NAB GS-21 0 18 T12 DT (SUTURE) ×1 IMPLANT
SUT PDS AB 1 TP1 54 (SUTURE) ×4 IMPLANT
SUT SILK 2 0 SH CR/8 (SUTURE) ×2 IMPLANT
SUT SILK 2 0 TIES 10X30 (SUTURE) ×2 IMPLANT
SUT SILK 3 0 SH CR/8 (SUTURE) ×2 IMPLANT
SUT SILK 3 0 TIES 10X30 (SUTURE) ×2 IMPLANT
TOWEL GREEN STERILE (TOWEL DISPOSABLE) ×2 IMPLANT
TRAY FOLEY MTR SLVR 16FR STAT (SET/KITS/TRAYS/PACK) ×2 IMPLANT
YANKAUER SUCT BULB TIP NO VENT (SUCTIONS) IMPLANT

## 2019-03-19 NOTE — Op Note (Signed)
03/19/2019  10:09 AM  PATIENT:  Emily [name]  39 y.o. female  PRE-OPERATIVE DIAGNOSIS:  CHRONIC DRAINING SINUS, infected mesh  POST-OPERATIVE DIAGNOSIS:  CHRONIC DRAINING SINUS, infected mesh  PROCEDURE:  Procedure(s): Abdominal Wound Exploration with Explantation of Mesh-6 x 5 cm  SURGEON:  Surgeon(s) and Role:    Ralene Ok, MD - Primary  ASSISTANTS: Pryor Curia, RNFA   ANESTHESIA:   general  EBL:  minimal   BLOOD ADMINISTERED:none  DRAINS: Saline soaked Kerlix packed into the abdominal wound  LOCAL MEDICATIONS USED:  NONE  SPECIMEN:  No Specimen  DISPOSITION OF SPECIMEN:  N/A  COUNTS:  YES  TOURNIQUET:  * No tourniquets in log *  DICTATION: .Dragon Dictation Indication procedure: The patient is a 39 year old female with a history of multiple abdominal surgeries, recently had mesh onlay placement due to hernia.  This was subsequently infected and had created a chronic sinus tract.  Patient was taken back to the operating for excision of infected mesh and wound debridement.  Details procedure: After the patient was consented she was taken back to the OR and placed in supine position with bilateral SCDs in place.  General general trach intubation.  Patient was then prepped draped sterile fashion.  Timeout was called all facts verified.  Elliptical incision was made over the area of the cutaneous sinus tract.  Cautery was used to maintain hemostasis and dissection was taken down to the onlay mesh.  The Prolene mesh to easily visualized.  The edges of the mesh and the Prolenes that were holding in place could easily be defined.  I proceeded with removing the mesh from the inferior to the superior direction.  There was a large amount of unincorporated mesh.  This did not penetrate the linea alba.  This was circumferentially dissected and the mesh was excised in its entirety.  There were no refills that were interrupted there a hole in the midline close.  These  were left in place.  The mesh that was explanted was approximately 6 x 5 cm.  At this time the area was irrigated out with sterile saline.  The edges of the wound were freshened up with a curette.  At this time 3-0 Vicryl's were used to reapproximate the subcutaneous to the fascial layer.  There was an area in the midline that was left open.  At this time this was packed with saline soaked Kerlix.  This was covered with 4 x 4's, ABD pad and an abdominal binder.  Patient tolerated procedure well was taken to the recovery stable condition.   PLAN OF CARE: Admit for overnight observation  PATIENT DISPOSITION:  PACU - hemodynamically stable.   Delay start of Pharmacological VTE agent (>24hrs) due to surgical blood loss or risk of bleeding: no

## 2019-03-19 NOTE — Transfer of Care (Signed)
Immediate Anesthesia Transfer of Care Note  Patient: Emily [name]  Procedure(s) Performed: Abdominal Wound Exploration with Explantation of Mesh  Patient Location: PACU  Anesthesia Type:General  Level of Consciousness: awake, alert  and oriented  Airway & Oxygen Therapy: Patient Spontanous Breathing and Patient connected to face mask oxygen  Post-op Assessment: Report given to RN and Post -op Vital signs reviewed and stable  Post vital signs: Reviewed and stable  Last Vitals:  Vitals Value Taken Time  BP 133/97 03/19/19 1016  Temp    Pulse 105 03/19/19 1021  Resp 16 03/19/19 1021  SpO2 100 % 03/19/19 1021  Vitals shown include unvalidated device data.  Last Pain:  Vitals:   03/19/19 0744  TempSrc:   PainSc: 8       Patients Stated Pain Goal: 4 (0000000 [id number])  Complications: No apparent anesthesia complications

## 2019-03-19 NOTE — Anesthesia Postprocedure Evaluation (Signed)
Anesthesia Post Note  Patient: Emily [name]  Procedure(s) Performed: Abdominal Wound Exploration with Explantation of Mesh     Patient location during evaluation: PACU Anesthesia Type: General Level of consciousness: sedated, patient cooperative and oriented Pain management: pain level controlled (pain improved now, able to doze) Vital Signs Assessment: post-procedure vital signs reviewed and stable Respiratory status: spontaneous breathing, nonlabored ventilation and respiratory function stable Cardiovascular status: blood pressure returned to baseline and stable Postop Assessment: no apparent nausea or vomiting Anesthetic complications: no    Last Vitals:  Vitals:   03/19/19 0701 03/19/19 1015  BP: (!) 169/106 (!) 133/97  Pulse: (!) 103 96  Resp: 18 15  Temp: 36.9 C 37.1 C  SpO2: 100% 98%    Last Pain:  Vitals:   03/19/19 1015  TempSrc:   PainSc: 10-Worst pain ever                 Hammad Finkler,E. Demichael Traum

## 2019-03-19 NOTE — Anesthesia Procedure Notes (Signed)
Procedure Name: Intubation Date/Time: 03/19/2019 8:56 AM Performed by: Alain Marion, CRNA Pre-anesthesia Checklist: Patient identified, Emergency Drugs available, Suction available and Patient being monitored Patient Re-evaluated:Patient Re-evaluated prior to induction Oxygen Delivery Method: Circle System Utilized Preoxygenation: Pre-oxygenation with 100% oxygen Induction Type: IV induction and Rapid sequence Laryngoscope Size: Miller and 2 Grade View: Grade I Tube type: Oral Tube size: 7.0 mm Number of attempts: 1 Airway Equipment and Method: Stylet Placement Confirmation: ETT inserted through vocal cords under direct vision,  positive ETCO2 and breath sounds checked- equal and bilateral Secured at: 21 cm Tube secured with: Tape Dental Injury: Teeth and Oropharynx as per pre-operative assessment

## 2019-03-19 NOTE — Interval H&P Note (Signed)
History and Physical Interval Note:  03/19/2019 8:27 AM  Emily [name]  has presented today for surgery, with the diagnosis of CHRONIC DRAINING SINUS.  The various methods of treatment have been discussed with the patient and family. After consideration of risks, benefits and other options for treatment, the patient has consented to  Procedure(s): EXPLORATORY LAPAROTOMY, POSSIBLE EXPLANTATION OF MESH (N/A) as a surgical intervention.  The patient's history has been reviewed, patient examined, no change in status, stable for surgery.  I have reviewed the patient's chart and labs.  Questions were answered to the patient's satisfaction.     Ralene Ok

## 2019-03-20 DIAGNOSIS — T8579XA Infection and inflammatory reaction due to other internal prosthetic devices, implants and grafts, initial encounter: Secondary | ICD-10-CM | POA: Diagnosis not present

## 2019-03-20 MED ORDER — OXYCODONE HCL 5 MG PO TABS: 5.0000 mg | ORAL_TABLET | Freq: Four times a day (QID) | ORAL | 0 refills | Status: DC | PRN

## 2019-03-20 NOTE — Discharge Summary (Signed)
[name] Discharge Summary   Patient ID: Emily [name] MRN: [id number] DOB/AGE: [birth year] 41 y.o.  Admit date: 03/19/2019 Discharge date: 03/20/2019  Admitting Diagnosis: Infected hernioplasty mesh  Discharge Diagnosis Patient Active Problem List   Diagnosis Date Noted  . Infected hernioplasty mesh ([place]) 03/19/2019  . S/P emergency C-section   . Pseudoseizures   . MS (multiple sclerosis) ([place])   . ADHD   . Anxiety   . Chronic pain   . Panic attack   . Open wound of abdominal wall, anterior, complicated, sequela [id number]  . Postoperative wound infection 09/20/2018  . Acute postoperative pain 08/25/2018  . History of substance use disorder 07/19/2018  . Nausea without vomiting 04/13/2018  . Nausea with vomiting 03/23/2018  . Pharmacologic therapy 03/16/2018  . Disorder of skeletal system 03/16/2018  . Problems influencing health status 03/16/2018  . Abnormal MRI, cervical spine (03/06/17) 03/16/2018  . Morbid obesity with BMI of 45.0-49.9, adult ([place]) 03/16/2018  . Chronic pain of both knees 02/18/2018  . Cervical facet hypertrophy 04/29/2017  . Cervical facet syndrome (Bilateral) (R>L) 04/29/2017  . Spondylosis without myelopathy or radiculopathy, cervical region 04/29/2017  . Spondylosis without myelopathy or radiculopathy, lumbosacral region 03/04/2017  . Cervicogenic headache 02/10/2017  . Occipital headache 02/10/2017  . Trigger point with back pain (Left) 02/10/2017  . History of fainting (vasovagal) 02/10/2017  . History of vasovagal syncope 02/10/2017  . Acute left-sided low back pain without sciatica 02/10/2017  . Neurogenic pain 01/23/2017  . Chronic musculoskeletal pain 12/02/2016  . Cervical radiculitis (Bilateral) 11/12/2016  . Chronic upper back pain (Primary Area of Pain) (Bilateral) (R>L) 11/01/2016  . Chronic hand pain [facility] Area of Pain) (Bilateral) (L>R) 11/01/2016  . Chronic wrist pain [facility] Area of Pain) (Bilateral) (L>R)  11/01/2016  . Chronic foot/toes pain (Fourth Area of Pain) (Bilateral) (R>L) 11/01/2016  . Chronic upper extremity pain (Secondary Area of Pain) (Bilateral) (L>R) 11/01/2016  . Long term prescription benzodiazepine use 11/01/2016  . 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 Cervical foraminal stenosis (Bilateral) (C6-7) 11/01/2016  . 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 DDD (degenerative disc disease), thoracic 11/01/2016  . DDD (degenerative disc disease), lumbar 11/01/2016  . Lumbar facet arthropathy 11/01/2016  . Lumbar facet syndrome (Bilateral) (R>L) 11/01/2016  . Vitamin D deficiency 10/28/2016  . Opiate use 07/23/2016  . Chronic neck pain (Primary Area of Pain) (Bilateral) (R>L) 07/23/2016  . Chronic low back pain (Secondary Area of Pain) (Bilateral) (L>R) 07/23/2016  . Chronic knee pain (Fifth Area of Pain) (Left) 07/23/2016  . Midline thoracic back pain 07/23/2016  . Chronic lower extremity pain (Bilateral) 07/23/2016  . Incarcerated incisional hernia 02/16/2016  . Incisional hernia 02/15/2016  . Carpal tunnel syndrome [facility] Area of Pain) (Bilateral) (L>R) 11/13/2015  . Substance abuse ([place]) 09/07/2015  . Paresthesia 08/02/2015  . Chronic pain syndrome 08/02/2015  . Postpartum endometritis 07/21/2015  . PTSD (post-traumatic stress disorder) 05/16/2015  . Conversion disorder with seizures or convulsions 05/16/2015  . Severe episode of recurrent major depressive disorder, without psychotic features ([place]) 05/16/2015  . GAD (generalized anxiety disorder) 05/16/2015  . Difficult intravenous access 05/08/2015  . Right optic neuritis 04/27/2015  . Conversion disorder with attacks or seizures, persistent, with psychological stressor 03/28/2015  . Migraine with aura and without status migrainosus, not intractable 03/28/2015  . Generalized anxiety disorder 03/28/2015  . HTN (hypertension) 02/27/2015  . Perforated bowel ([place]) 02/27/2015  .  Depression 12/27/2014  . Multiple  sclerosis ([place]) 12/27/2014  . Migraine 12/27/2014  . Seizure disorder ([place]) 12/27/2014  . Smoker 12/27/2014  . Obesity 12/27/2014  . DDD (degenerative disc disease), cervical 07/28/2014  . Major depressive disorder, single episode 11/02/2013  . Laryngopharyngeal reflux 10/25/2010  . Tonsillitis 10/25/2010    Consultants None  Imaging: No results found.  Procedures Dr. Rosendo Gros (03/19/19) - Abdominal Wound Exploration with Explantation of Mesh-6 x 5 cm  Hospital Course:  Patient is a 39 year old female who presented to [facility] from home for excision of infected hernioplasty mesh. Patient was admitted and underwent procedure listed above.  Tolerated procedure well and was transferred to the floor.  Diet was advanced as tolerated.  On POD#1, the patient was voiding well, tolerating diet, ambulating well, pain well controlled, vital signs stable, incisions c/d/i and felt stable for discharge home.  Patient will follow up in our office in 2 weeks and knows to call with questions or concerns. She will call to confirm appointment date/time.    Physical Exam: General:  Alert, NAD, pleasant, comfortable Abd:  Soft, ND, mild tenderness, wound with SS drainage mostly on packing   Allergies as of 03/20/2019      Reactions   Baclofen Hives, Shortness Of Breath   Duloxetine Hcl Shortness Of Breath, Rash   Gabapentin Shortness Of Breath, Rash   Monosodium Glutamate Anaphylaxis   Other Shortness Of Breath, Rash, Other (See Comments)   MSG, beans (vomiting) MSG causes hives, itching, throat swelling   Acetaminophen Nausea And Vomiting   Projectile vomiting   Alprazolam Other (See Comments)   Lethargy   Amitriptyline Other (See Comments), Hypertension   hypertension   Magnesium Salicylate Hives, Itching   Modafinil Other (See Comments)   Seizures   Rizatriptan Nausea And Vomiting, Other (See Comments)   GI upset, Projectile vomiting GI upset, Projectile  vomiting   Tizanidine Hives   Other reaction(s): GI Upset (intolerance)   Vicodin [hydrocodone-acetaminophen] Hives, Nausea And Vomiting, Other (See Comments)   Projectile vomiting   Adhesive [tape] Other (See Comments)   Skin irritation Paper tape is ok   Hydrocodone-acetaminophen Hives, Nausea And Vomiting   Projectile vomiting   Ketorolac Tromethamine Nausea Only   Toradol    Lamotrigine Rash   Nsaids Other (See Comments)   Irritate stomach    Tramadol Nausea And Vomiting   Other reaction(s): GI Upset (intolerance)      Medication List    STOP taking these medications   amoxicillin-clavulanate 875-125 MG tablet Commonly known as: AUGMENTIN     TAKE these medications   Aimovig 140 MG/ML Soaj Generic drug: Erenumab-aooe Inject 140 mg into the muscle every 30 (thirty) days.   albuterol 108 (90 Base) MCG/ACT inhaler Commonly known as: VENTOLIN HFA Inhale 2 puffs into the lungs every 6 (six) hours as needed for wheezing or shortness of breath. What changed: when to take this   amLODipine 5 MG tablet Commonly known as: NORVASC Take 5 mg by mouth daily.   atomoxetine 40 MG capsule Commonly known as: STRATTERA TAKE 1 CAPSULE BY MOUTH EVERY DAY   busPIRone 15 MG tablet Commonly known as: BUSPAR Take 1 tablet (15 mg total) by mouth 3 (three) times daily.   diazepam 5 MG tablet Commonly known as: Valium Take 1 tablet (5 mg total) by mouth daily as needed for anxiety.   diphenhydrAMINE 25 MG tablet Commonly known as: BENADRYL Take 50 mg by mouth at bedtime.   divalproex 500 MG DR tablet Commonly known  as: Depakote Take 1 tablet (500 mg total) by mouth at bedtime.   dronabinol 2.5 MG capsule Commonly known as: Marinol Take 1 capsule (2.5 mg total) by mouth 3 (three) times daily.   EpiPen 0.3 mg/0.3 mL Soaj injection Generic drug: EPINEPHrine Inject 0.3 mg into the muscle as needed for anaphylaxis.   etonogestrel 68 MG Impl implant Commonly known as:  NEXPLANON 1 each by Subdermal route once.   FLUoxetine 40 MG capsule Commonly known as: PROzac Take 2 capsules (80 mg total) by mouth every morning.   Gilenya 0.5 MG Caps Generic drug: Fingolimod HCl Take 1 capsule (0.5 mg total) by mouth daily.   haloperidol 1 MG tablet Commonly known as: HALDOL Take 1 tablet (1 mg total) by mouth 2 (two) times daily.   lithium carbonate 300 MG CR tablet Commonly known as: LITHOBID Take 2 tablets (600 mg total) by mouth 2 (two) times daily.   loratadine 10 MG tablet Commonly known as: CLARITIN Take 10 mg by mouth daily as needed for allergies.   methocarbamol 750 MG tablet Commonly known as: ROBAXIN Take 1 tablet (750 mg total) by mouth 2 (two) times daily as needed for muscle spasms. What changed: when to take this   omeprazole 20 MG capsule Commonly known as: PRILOSEC TAKE 1 CAPSULE (20 MG TOTAL) BY MOUTH 2 (TWO) TIMES DAILY BEFORE A MEAL.   OVER THE COUNTER MEDICATION Take 1-2 tablets by mouth 3 (three) times daily as needed (anxiety or pain). CBD Gummies   oxyCODONE 5 MG immediate release tablet Commonly known as: Roxicodone Take 1 tablet (5 mg total) by mouth every 6 (six) hours as needed for severe pain. What changed: when to take this   pregabalin 100 MG capsule Commonly known as: LYRICA Take 300 mg by mouth at bedtime. What changed: Another medication with the same name was removed. Continue taking this medication, and follow the directions you see here.   topiramate 100 MG tablet Commonly known as: TOPAMAX Take 1.5 tablets twice a day   Voltaren 1 % Gel Generic drug: diclofenac Sodium Apply 1 application topically 2 (two) times daily as needed (knee and back pain).        Follow-up Information    Ralene Ok, MD In 2 weeks.   Specialty: General Surgery Why: Post op visit Contact information: [address] [place]  [place] [address] [phone]           Signed: Brigid Re, [facility] 03/20/2019, 9:16 AM Please see Amion for pager number during day hours 7:00am-4:30pm

## 2019-03-20 NOTE — Plan of Care (Signed)
  Problem: Education: Goal: Knowledge of General Education information will improve Description Including pain rating scale, medication(s)/side effects and non-pharmacologic comfort measures Outcome: Progressing   

## 2019-03-20 NOTE — Discharge Planning (Signed)
Patient discharged home in stable condition. Verbalizes understanding of all discharge instructions, including home medications and follow up appointments. 

## 2019-03-20 NOTE — Discharge Instructions (Signed)
[facility] _____[facility]   POST OP INSTRUCTIONS  Always review your discharge instruction sheet given to you by the facility where your surgery was performed. IF YOU HAVE DISABILITY OR FAMILY LEAVE FORMS, YOU MUST BRING THEM TO THE OFFICE FOR PROCESSING.   DO NOT GIVE THEM TO YOUR DOCTOR.  1. A  prescription for pain medication may be given to you upon discharge.  Take your pain medication as prescribed, if needed.  If narcotic pain medicine is not needed, then you may take acetaminophen (Tylenol) or ibuprofen (Advil) as needed. 2. Take your usually prescribed medications unless otherwise directed. If you need a refill on your pain medication, please contact your pharmacy.  They will contact our office to request authorization. Prescriptions will not be filled after 5 pm or on week-ends. 3. You should follow a light diet the first 24 hours after arrival home, such as soup and crackers, etc.  Be sure to include lots of fluids daily.  Resume your normal diet the day after surgery. 4.Most patients will experience some swelling and bruising around the umbilicus or in the groin and scrotum.  Ice packs and reclining will help.  Swelling and bruising can take several days to resolve.  6. It is common to experience some constipation if taking pain medication after surgery.  Increasing fluid intake and taking a stool softener (such as Colace) will usually help or prevent this problem from occurring.  A mild laxative (Milk of Magnesia or Miralax) should be taken according to package directions if there are no bowel movements after 48 hours. 7. Unless discharge instructions indicate otherwise, you may remove your bandages 24-48 hours after surgery, and you may shower at that time.  You may have steri-strips (small skin tapes) in place directly over the incision.  These strips should be left on the skin for 7-10 days.  If your surgeon used skin glue on the incision, you may shower in 24 hours.  The  glue will flake off over the next 2-3 weeks.  Any sutures or staples will be removed at the office during your follow-up visit. 8. ACTIVITIES:  You may resume regular (light) daily activities beginning the next day--such as daily self-care, walking, climbing stairs--gradually increasing activities as tolerated.  You may have sexual intercourse when it is comfortable.  Refrain from any heavy lifting or straining until approved by your doctor.  a.You may drive when you are no longer taking prescription pain medication, you can comfortably wear a seatbelt, and you can safely maneuver your car and apply brakes.   9.You should see your doctor in the office for a follow-up appointment approximately 2-3 weeks after your surgery.  Make sure that you call for this appointment within a day or two after you arrive home to insure a convenient appointment time.   WHEN TO CALL YOUR DOCTOR: 1. Fever over 101.0 2. Inability to urinate 3. Nausea and/or vomiting 4. Extreme swelling or bruising 5. Continued bleeding from incision. 6. Increased pain, redness, or drainage from the incision  The clinic staff is available to answer your questions during regular business hours.  Please don't hesitate to call and ask to speak to one of the nurses for clinical concerns.  If you have a medical emergency, go to the nearest emergency room or call 911.  A surgeon from [facility] is always on call at the hospital   [address] [place], [place], [place]  [address] ?  [id number],  [place], [place]   [address] [phone] ? [phone] ? FAX [phone] Web site: [email or web address]  MIDLINE WOUND CARE: - midline dressing to be changed twice daily - supplies: sterile saline, kerlix, scissors, ABD pads, tape  - remove dressing and all packing carefully, moistening with sterile saline as needed to avoid packing/internal dressing sticking to the wound. - clean edges of skin around  the wound with water/gauze, making sure there is no tape debris or leakage left on skin that could cause skin irritation or breakdown. - dampen and clean kerlix with sterile saline and pack wound from wound base to skin level, making sure to take note of any possible areas of wound tracking, tunneling and packing appropriately. Wound can be packed loosely. Trim kerlix to size if a whole kerlix is not required. - cover wound with a dry ABD pad and secure with tape.  - write the date/time on the dry dressing/tape to better track when the last dressing change occurred. - apply any skin protectant/powder recommended by clinician to protect skin/skin folds. - change dressing as needed if leakage occurs, wound gets contaminated, or patient requests to shower. - patient may shower daily with wound open and following the shower the wound should be dried and a clean dressing placed.

## 2019-03-21 ENCOUNTER — Emergency Department (HOSPITAL_COMMUNITY)
Admission: EM | Admit: 2019-03-21 | Discharge: 2019-03-21 | Disposition: A | Discharge status: Home / Self Care | Payer: Medicare HMO | Attending: Emergency Medicine | Admitting: Emergency Medicine

## 2019-03-21 ENCOUNTER — Other Ambulatory Visit: Payer: Self-pay

## 2019-03-21 ENCOUNTER — Telehealth: Payer: Self-pay | Admitting: Physician Assistant

## 2019-03-21 ENCOUNTER — Encounter (HOSPITAL_COMMUNITY): Payer: Self-pay | Admitting: Emergency Medicine

## 2019-03-21 DIAGNOSIS — G35 Multiple sclerosis: Secondary | ICD-10-CM | POA: Diagnosis not present

## 2019-03-21 DIAGNOSIS — J45909 Unspecified asthma, uncomplicated: Secondary | ICD-10-CM | POA: Insufficient documentation

## 2019-03-21 DIAGNOSIS — Z5189 Encounter for other specified aftercare: Secondary | ICD-10-CM | POA: Diagnosis not present

## 2019-03-21 DIAGNOSIS — Z87891 Personal history of nicotine dependence: Secondary | ICD-10-CM | POA: Diagnosis not present

## 2019-03-21 DIAGNOSIS — Z79899 Other long term (current) drug therapy: Secondary | ICD-10-CM | POA: Insufficient documentation

## 2019-03-21 DIAGNOSIS — I808 Phlebitis and thrombophlebitis of other sites: Secondary | ICD-10-CM | POA: Diagnosis not present

## 2019-03-21 NOTE — ED Triage Notes (Signed)
Patient has left hand pain with swelling for two days. Patient had an IV in this hand recently.

## 2019-03-21 NOTE — Discharge Instructions (Addendum)
Get help right away if: You have any of these problems: New or worse pain, swelling, or redness in an arm or leg. Numbness or tingling in an arm or leg. Shortness of breath. Chest pain. Severe pain in your abdomen. Fast breathing. A fast or irregular heartbeat. Blood in your vomit, stool, or urine. A severe headache or confusion. A cut that does not stop bleeding. You feel light-headed or dizzy. You cough up blood. You have a serious fall or accident, or you hit your head.

## 2019-03-21 NOTE — ED Provider Notes (Signed)
[facility] EMERGENCY DEPARTMENT Provider Note   CSN: [id number] Arrival date & time: 03/21/19  1548     History Chief Complaint  Patient presents with  . Hand Pain    Emily [name] is a 39 y.o. female with a past medical history of multiple sclerosis, she has a history of perforated bowel and multiple bowel surgeries.  Patient only underwent I&D and mesh removal from infected hernioplasty on 03/19/2019.  The patient states she was discharged yesterday however home health was not ordered at discharge and she did not have anyone to change the packing.  Patient states that she was able to change her bandage and presented here for evaluation and wound care management.  Secondly the patient is also having pain over the left dorsum of her hand where she had a IV placed.  The area is warm and red.  It is painful.  She was very concerned that it might be infected, tearful and anxious.  Patient denies fevers, chills, discharge from her abdominal wound.  HPI     Past Medical History:  Diagnosis Date  . ADHD   . Anxiety   . Asthma as a child  . Chronic back pain   . Chronic pain   . Conversion disorder   . DDD (degenerative disc disease), cervical   . Depression   . External hemorrhoid   . Fatty liver   . GERD (gastroesophageal reflux disease)   . History of kidney stones   . HTN (hypertension) 02/27/2015  . JC virus antibody positive   . Migraines   . MS (multiple sclerosis) ([place])    06-2006  . Neuromuscular disorder ([place])    MS  . Neuropathy   . OA (osteoarthritis)   . Ovarian cyst   . Panic attack   . Perforated bowel ([place]) 2009   colostomy bag for 3 motnhs  . 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 PTSD (post-traumatic stress disorder)   . S/P emergency C-section   . Seasonal allergies   . Urinary urgency   . Vertigo   . Wears contact lenses   . Wears glasses     Patient Active Problem List   Diagnosis Date Noted  . Infected  hernioplasty mesh ([place]) 03/19/2019  . S/P emergency C-section   . Pseudoseizures   . MS (multiple sclerosis) ([place])   . ADHD   . Anxiety   . Chronic pain   . Panic attack   . Open wound of abdominal wall, anterior, complicated, sequela [id number]  . Postoperative wound infection 09/20/2018  . Acute postoperative pain 08/25/2018  . History of substance use disorder 07/19/2018  . Nausea without vomiting 04/13/2018  . Nausea with vomiting 03/23/2018  . Pharmacologic therapy 03/16/2018  . Disorder of skeletal system 03/16/2018  . Problems influencing health status 03/16/2018  . Abnormal MRI, cervical spine (03/06/17) 03/16/2018  . Morbid obesity with BMI of 45.0-49.9, adult ([place]) 03/16/2018  . Chronic pain of both knees 02/18/2018  . Cervical facet hypertrophy 04/29/2017  . Cervical facet syndrome (Bilateral) (R>L) 04/29/2017  . Spondylosis without myelopathy or radiculopathy, cervical region 04/29/2017  . Spondylosis without myelopathy or radiculopathy, lumbosacral region 03/04/2017  . Cervicogenic headache 02/10/2017  . Occipital headache 02/10/2017  . Trigger point with back pain (Left) 02/10/2017  . History of fainting (vasovagal) 02/10/2017  . History of vasovagal syncope 02/10/2017  . Acute left-sided low back pain without sciatica 02/10/2017  . Neurogenic pain 01/23/2017  .  Chronic musculoskeletal pain 12/02/2016  . Cervical radiculitis (Bilateral) 11/12/2016  . Chronic upper back pain (Primary Area of Pain) (Bilateral) (R>L) 11/01/2016  . Chronic hand pain [facility] Area of Pain) (Bilateral) (L>R) 11/01/2016  . Chronic wrist pain [facility] Area of Pain) (Bilateral) (L>R) 11/01/2016  . Chronic foot/toes pain (Fourth Area of Pain) (Bilateral) (R>L) 11/01/2016  . Chronic upper extremity pain (Secondary Area of Pain) (Bilateral) (L>R) 11/01/2016  . Long term prescription benzodiazepine use 11/01/2016  . 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 Cervical  foraminal stenosis (Bilateral) (C6-7) 11/01/2016  . 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 DDD (degenerative disc disease), thoracic 11/01/2016  . DDD (degenerative disc disease), lumbar 11/01/2016  . Lumbar facet arthropathy 11/01/2016  . Lumbar facet syndrome (Bilateral) (R>L) 11/01/2016  . Vitamin D deficiency 10/28/2016  . Opiate use 07/23/2016  . Chronic neck pain (Primary Area of Pain) (Bilateral) (R>L) 07/23/2016  . Chronic low back pain (Secondary Area of Pain) (Bilateral) (L>R) 07/23/2016  . Chronic knee pain (Fifth Area of Pain) (Left) 07/23/2016  . Midline thoracic back pain 07/23/2016  . Chronic lower extremity pain (Bilateral) 07/23/2016  . Incarcerated incisional hernia 02/16/2016  . Incisional hernia 02/15/2016  . Carpal tunnel syndrome [facility] Area of Pain) (Bilateral) (L>R) 11/13/2015  . Substance abuse ([place]) 09/07/2015  . Paresthesia 08/02/2015  . Chronic pain syndrome 08/02/2015  . Postpartum endometritis 07/21/2015  . PTSD (post-traumatic stress disorder) 05/16/2015  . Conversion disorder with seizures or convulsions 05/16/2015  . Severe episode of recurrent major depressive disorder, without psychotic features ([place]) 05/16/2015  . GAD (generalized anxiety disorder) 05/16/2015  . Difficult intravenous access 05/08/2015  . Right optic neuritis 04/27/2015  . Conversion disorder with attacks or seizures, persistent, with psychological stressor 03/28/2015  . Migraine with aura and without status migrainosus, not intractable 03/28/2015  . Generalized anxiety disorder 03/28/2015  . HTN (hypertension) 02/27/2015  . Perforated bowel ([place]) 02/27/2015  . Depression 12/27/2014  . Multiple sclerosis () 12/27/2014  . Migraine 12/27/2014  . Seizure disorder ([place]) 12/27/2014  . Smoker 12/27/2014  . Obesity 12/27/2014  . DDD (degenerative disc disease), cervical 07/28/2014  . Major depressive disorder, single episode 11/02/2013  . Laryngopharyngeal reflux  10/25/2010  . Tonsillitis 10/25/2010    Past Surgical History:  Procedure Laterality Date  . ABDOMINAL SURGERY    . ABDOMINAL WOUND EXPLORATION   03/19/2019   Abdominal Wound Exploration with Explantation of Mesh  . APPENDECTOMY    . BOWEL RESECTION  01/2007   with colostomy  . CESAREAN SECTION N/A 07/12/2015   Procedure: CESAREAN SECTION;  Surgeon: Guss Bunde, MD;  Location: [place];  Service: Obstetrics;  Laterality: N/A;  . COLON SURGERY     part of colon and intestines removed; colostomy 2008  . COLONOSCOPY WITH PROPOFOL N/A 01/29/2017   Procedure: COLONOSCOPY WITH PROPOFOL;  Surgeon: Ileana Roup, MD;  Location: [facility];  Service: General;  Laterality: N/A;  . COLOSTOMY CLOSURE  04/2007  . EVALUATION UNDER ANESTHESIA WITH HEMORRHOIDECTOMY N/A 06/11/2018   Procedure: ANORECTAL EXAM UNDER ANESTHESIA WITH HEMORRHOIDECTOMY;  Surgeon: Ileana Roup, MD;  Location: [place];  Service: General;  Laterality: N/A;  . EXCISIONAL HEMORRHOIDECTOMY    . EXTREMITY CYST EXCISION  1994   right leg  . HERNIA REPAIR    . INCISIONAL HERNIA REPAIR N/A 02/16/2016   Procedure: HERNIA REPAIR INCISIONAL;  Surgeon: Fanny Skates, MD;  Location: [facility];  Service: General;  Laterality: N/A;  . INSERTION OF MESH  02/16/2016   Procedure: INSERTION OF MESH;  Surgeon: Fanny Skates, MD;  Location: [facility];  Service: General;;  . LAPAROTOMY  03/19/2019   Procedure: Abdominal Wound Exploration with Explantation of Mesh;  Surgeon: Ralene Ok, MD;  Location: [place];  Service: General;;  . RADIOLOGY WITH ANESTHESIA N/A 03/06/2017   Procedure: MRI WITH ANESTHESIA OF CERVICAL SPINE WITHOUT CONTRAST, MRI OF LUMBAR SPINE WITHOUT CONTRAST;  Surgeon: Radiologist, Medication, MD;  Location: [place];  Service: Radiology;  Laterality: N/A;  . RADIOLOGY WITH ANESTHESIA N/A 09/15/2018   Procedure: MRI OF BRAIN WITH AND WITHOUT CONTRAST;  Surgeon: Radiologist, Medication, MD;   Location: [place];  Service: Radiology;  Laterality: N/A;  . RADIOLOGY WITH ANESTHESIA N/A 10/13/2018   Procedure: MRI OF BRAIN WITH AND WITHOUT CONTRAST;  Surgeon: Radiologist, Medication, MD;  Location: [place];  Service: Radiology;  Laterality: N/A;  . RADIOLOGY WITH ANESTHESIA N/A 10/26/2018   Procedure: MRI UNDER ANESTHESIA; HEAD, CERVICAL AND THORASCIC SPINE;  Surgeon: Radiologist, Medication, MD;  Location: [place];  Service: Radiology;  Laterality: N/A;  . SCAR REVISION  01/21/2011   Procedure: SCAR REVISION;  Surgeon: Hermelinda Dellen;  Location: [place];  Service: Plastics;  Laterality: N/A;  exploration of scar of abdomen and repair of defect  . TOOTH EXTRACTION Left 10/2016  . TRANSRECTAL DRAINAGE OF PELVIC ABSCESS       OB History    Gravida  1   Para  1   Term      Preterm  1   AB      Living  1     SAB      TAB      Ectopic      Multiple  0   Live Births  1           Family History  Problem Relation Age of Onset  . Diabetes Mother   . Hypertension Mother   . Diabetes Father   . Hypertension Father   . Arthritis Father   . Cancer Maternal Grandmother   . Cancer Maternal Grandfather   . Alcohol abuse Neg Hx   . Anxiety disorder Neg Hx   . Bipolar disorder Neg Hx   . Drug abuse Neg Hx   . Depression Neg Hx   . Colon cancer Neg Hx     Social History   Tobacco Use  . Smoking status: Former Smoker    Packs/day: 0.25    Years: 10.00    Pack years: 2.50    Types: Cigarettes    Quit date: 07/12/2015    Years since quitting: 3.6  . Smokeless tobacco: Never Used  Substance Use Topics  . Alcohol use: No    Alcohol/week: 0.0 standard drinks  . Drug use: No    Comment: marinol shows up as THC    Home Medications Prior to Admission medications   Medication Sig Start Date End Date Taking? Authorizing Provider  AIMOVIG 140 MG/ML SOAJ Inject 140 mg into the muscle every 30 (thirty) days.  08/21/18   [provider]    albuterol (PROVENTIL HFA;VENTOLIN HFA) 108 (90 Base) MCG/ACT inhaler Inhale 2 puffs into the lungs every 6 (six) hours as needed for wheezing or shortness of breath. Patient taking differently: Inhale 2 puffs into the lungs every 4 (four) hours as needed for wheezing or shortness of breath.  02/15/15   Jaclyn Prime, Collene Leyden, PA-C  amLODipine (NORVASC)  5 MG tablet Take 5 mg by mouth daily.  01/23/16   [provider]  atomoxetine (STRATTERA) 40 MG capsule TAKE 1 CAPSULE BY MOUTH EVERY DAY Patient not taking: Reported on 01/06/2019 11/05/18   Charlcie Cradle, MD  busPIRone (BUSPAR) 15 MG tablet Take 1 tablet (15 mg total) by mouth 3 (three) times daily. Patient not taking: Reported on 01/06/2019 09/17/18   Charlcie Cradle, MD  diazepam (VALIUM) 5 MG tablet Take 1 tablet (5 mg total) by mouth daily as needed for anxiety. Patient not taking: Reported on 03/09/2019 12/21/18 12/21/19  Charlcie Cradle, MD  diclofenac Sodium (VOLTAREN) 1 % GEL Apply 1 application topically 2 (two) times daily as needed (knee and back pain).     [provider]  diphenhydrAMINE (BENADRYL) 25 MG tablet Take 50 mg by mouth at bedtime.    [provider]  divalproex (DEPAKOTE) 500 MG DR tablet Take 1 tablet (500 mg total) by mouth at bedtime. 05/05/18   Cameron Sprang, MD  dronabinol (MARINOL) 2.5 MG capsule Take 1 capsule (2.5 mg total) by mouth 3 (three) times daily. 12/01/18   Annitta Needs, NP  EPINEPHrine (EPIPEN) 0.3 mg/0.3 mL SOAJ injection Inject 0.3 mg into the muscle as needed for anaphylaxis.     [provider]  etonogestrel (NEXPLANON) 68 MG IMPL implant 1 each by Subdermal route once.    [provider]  Fingolimod HCl (GILENYA) 0.5 MG CAPS Take 1 capsule (0.5 mg total) by mouth daily. 02/03/19   Cameron Sprang, MD  FLUoxetine (PROZAC) 40 MG capsule Take 2 capsules (80 mg total) by mouth every morning. Patient not taking: Reported on 01/06/2019 09/17/18 09/17/19  Charlcie Cradle, MD  haloperidol (HALDOL) 1 MG tablet Take 1 tablet (1 mg total) by mouth 2 (two) times daily. Patient not taking: Reported on 01/06/2019 12/17/18   Charlcie Cradle, MD  lithium carbonate (LITHOBID) 300 MG CR tablet Take 2 tablets (600 mg total) by mouth 2 (two) times daily. Patient not taking: Reported on 01/06/2019 09/17/18   Charlcie Cradle, MD  loratadine (CLARITIN) 10 MG tablet Take 10 mg by mouth daily as needed for allergies.    [provider]  methocarbamol (ROBAXIN) 750 MG tablet Take 1 tablet (750 mg total) by mouth 2 (two) times daily as needed for muscle spasms. Patient taking differently: Take 750 mg by mouth 2 (two) times daily.  09/21/18 03/20/19  Milinda Pointer, MD  omeprazole (PRILOSEC) 20 MG capsule TAKE 1 CAPSULE (20 MG TOTAL) BY MOUTH 2 (TWO) TIMES DAILY BEFORE A MEAL. Patient not taking: Reported on 03/09/2019 06/18/18   Annitta Needs, NP  OVER THE COUNTER MEDICATION Take 1-2 tablets by mouth 3 (three) times daily as needed (anxiety or pain). CBD Gummies    [provider]  oxyCODONE (ROXICODONE) 5 MG immediate release tablet Take 1 tablet (5 mg total) by mouth every 6 (six) hours as needed for severe pain. 03/20/19   [place], Floyce Stakes, PA-C  pregabalin (LYRICA) 100 MG capsule Take 300 mg by mouth at bedtime.    [provider]  topiramate (TOPAMAX) 100 MG tablet Take 1.5 tablets twice a day Patient not taking: Reported on 03/09/2019 05/05/18   Cameron Sprang, MD    Allergies    Baclofen, Duloxetine hcl, Gabapentin, Monosodium glutamate, Other, Acetaminophen, Alprazolam, Amitriptyline, Magnesium salicylate, Modafinil, Rizatriptan, Tizanidine, Vicodin [hydrocodone-acetaminophen], Adhesive [tape], Hydrocodone-acetaminophen, Ketorolac tromethamine, Lamotrigine, Nsaids, and Tramadol  Review of Systems   Review of Systems Ten  systems reviewed and are negative for acute change, except as noted in the HPI.  Physical Exam Updated Vital Signs BP (!)  129/100 (BP Location: Right Arm)   Pulse 86   Temp 98.1 F (36.7 C) (Oral)   Resp 16   Ht 5\' 4"  (1.626 m)   Wt 114.8 kg   LMP 03/08/2019 (Approximate)   SpO2 100%   BMI 43.43 kg/m   Physical Exam Vitals and nursing note reviewed.  Constitutional:      General: She is not in acute distress.    Appearance: She is well-developed. She is not diaphoretic.  HENT:     Head: Normocephalic and atraumatic.  Eyes:     General: No scleral icterus.    Conjunctiva/sclera: Conjunctivae normal.  Cardiovascular:     Rate and Rhythm: Normal rate and regular rhythm.     Heart sounds: Normal heart sounds. No murmur. No friction rub. No gallop.      Comments: Left hand with ropey cordlike structure left forearm.  There is some erythema and tenderness consistent with thrombophlebitis. Pulmonary:     Effort: Pulmonary effort is normal. No respiratory distress.     Breath sounds: Normal breath sounds.  Abdominal:     General: Bowel sounds are normal. There is no distension.     Palpations: Abdomen is soft. There is no mass.     Tenderness: There is no abdominal tenderness. There is no guarding.     Comments: Open abdominal surgical wound to the first layer of the aponeurosis of the rectus muscle.  There are some visible Prolene sutures.  Packing removed.  No evidence of infection or discharge, no foul odor, no erythema, healthy appearing granulation tissue.  I replaced wet saline dressing in the surgical wound.  Musculoskeletal:     Cervical back: Normal range of motion.  Skin:    General: Skin is warm and dry.  Neurological:     Mental Status: She is alert and oriented to person, place, and time.  Psychiatric:        Behavior: Behavior normal.     ED Results / Procedures / Treatments   Labs (all labs ordered are listed, but only abnormal results are displayed) Labs Reviewed - No data to display  EKG None  Radiology No results found.  Procedures Procedures (including critical care  time)  Medications Ordered in ED Medications - No data to display  ED Course  I have reviewed the triage vital signs and the nursing notes.  Pertinent labs & imaging results that were available during my care of the patient were reviewed by me and considered in my medical decision making (see chart for details).    MDM Rules/Calculators/A&P                      39 year old female here for wound check and hand pain.  She has normal healing wound of the lower abdomen without evidence of infection.  She has been in touch with surgery who is going to set her up for home health and wound management tomorrow.  Patient also appears to have a superficial thrombophlebitis of the left hand without evidence of deeper thrombus suggestive of DVT.  Patient seen and shared visit with Dr. Sedonia Small who agrees with plan for discharge at this time. Final Clinical Impression(s) / ED Diagnoses Final diagnoses:  None    Rx / DC Orders ED Discharge Orders    None       Ocean Kearley,  Maryjane Hurter 03/21/19 2236    Maudie Flakes, MD 03/24/19 [phone]

## 2019-03-21 NOTE — Telephone Encounter (Signed)
Called patient to update on status of home health. I explained that I was under the impression she already had home health in place and that is why I did not place additional orders. I discussed with the case manager on the floor where patient was recently admitted who informed me that home health can not be referred out by the hospital case manager on a patient who is discharged. I notified patient of this and plan to contact our office tomorrow for outpatient referral to home health. Patient reported that she has been able to change outer portion of her dressing but not the packing. We discussed how to do this and what supplies would be needed. She reports that she does not have anyone to help her change the dressing and her father is unable to help. She reported concern over bloody drainage and I advised to directly visualize whether any area of wound was actively bleeding. Informed patient that if she saw any area of active bleeding she should hold pressure and the bleeding should stop. If she had any bleeding that she was unable to get to stop with pressure, then she should go to the ED.  Brigid Re , [facility] Surgery 03/21/2019, 10:54 AM Please see Amion for pager number during day hours 7:00am-4:30pm

## 2019-03-29 ENCOUNTER — Encounter: Payer: Self-pay | Admitting: Neurology

## 2019-03-29 ENCOUNTER — Telehealth (INDEPENDENT_AMBULATORY_CARE_PROVIDER_SITE_OTHER): Payer: Medicare HMO | Admitting: Neurology

## 2019-03-29 ENCOUNTER — Other Ambulatory Visit: Payer: Self-pay

## 2019-03-29 DIAGNOSIS — G35 Multiple sclerosis: Secondary | ICD-10-CM

## 2019-03-29 DIAGNOSIS — G43109 Migraine with aura, not intractable, without status migrainosus: Secondary | ICD-10-CM | POA: Diagnosis not present

## 2019-03-29 MED ORDER — TOPIRAMATE 100 MG PO TABS: ORAL_TABLET | ORAL | 3 refills | Status: DC

## 2019-03-29 MED ORDER — GILENYA 0.5 MG PO CAPS: 0.5000 mg | ORAL_CAPSULE | Freq: Every day | ORAL | 3 refills | Status: DC

## 2019-03-29 MED ORDER — DIVALPROEX SODIUM 500 MG PO DR TAB: 500.0000 mg | DELAYED_RELEASE_TABLET | Freq: Every day | ORAL | 3 refills | Status: DC

## 2019-03-29 MED ORDER — AIMOVIG 140 MG/ML ~~LOC~~ SOAJ: 140.0000 mg | SUBCUTANEOUS | 11 refills | Status: DC

## 2019-03-29 NOTE — Progress Notes (Signed)
Virtual Visit via Video Note The purpose of this virtual visit is to provide medical care while limiting exposure to the novel coronavirus.    Consent was obtained for video visit:  Yes.   Answered questions that patient had about telehealth interaction:  Yes.   I discussed the limitations, risks, security and privacy concerns of performing an evaluation and management service by telemedicine. I also discussed with the patient that there may be a patient responsible charge related to this service. The patient expressed understanding and agreed to proceed.  Pt location: Home Physician Location: office Name of referring provider:  Monico Blitz, MD I connected with [name] at patients initiation/request on 03/29/2019 at 10:00 AM EDT by video enabled telemedicine application and verified that I am speaking with the correct person using two identifiers. Pt MRN:  [id number] Pt DOB:  30-Jan-1980 Video Participants:  Emily [name]   History of Present Illness:  The patient had a virtual video visit on 03/29/2019. She was last seen in the neurology clinic 5 months ago for MS, migraines, and psychogenic non-epileptic events. Since her last visit, she had an interval MRI brain, cervical spine, and thoracic spine with and without contrast in October 2020. MRI brain stable, no new lesions, no active demyelination. MRI cervical spine stable from 2019 with subtle patchy cord signal abnormality at C2-4. She has degenerative disc osteophyte at C6-7 with moderate spinal stenosis, mild mass effect on the left greater than right spinal cord, advanced degenerative disc osteophyte at C5-6 with mild spinal stenosis and mild mass effect on left hemicord. Thoracic spine showed question subtle patchy cord signal abnormality at level of T6-7, T8-9, T9-10, no active demyelination. Mild degenerative spondylosis with no stenosis. Over the past few months, she has called for different symptoms, including dizziness,  feeling "shocked all over," back/neck pain, violent behavior, left arm tingling/numbness, left leg pain and numbness. She had an infected herniplasty mesh and underwent wound exploration and explantation of mesh last 03/19/19. She was back in the ER a few days later for wound care and left arm superficial thrombophlebitis.   From a neurological standpoint, she states she is doing better, doing okay. She is on Gilenya for MS, no side effects. She is not having as much migraines with the Aimovig monthly, in addition to Depakote 500mg  qhs and Topiramate 150mg  BID. Paresthesias are mostly unchanged except for some numbness up and down her left arm where the thrombophlebitis is. She has been more fatigued due to the surgery. No falls. She does not have a psychiatrist and is hoping to establish care locally. She has not had much of the stress seizures, she takes CBD gummies which helps.    History on Initial Assessment 03/28/2015: This is a pleasant 39 yo RH woman with a history of relapsing-remitting multiple sclerosis, headaches, anxiety with panic attacks, conversion disorder with seizures. She presented at [redacted] weeks pregnant to determine if diazepam for anxiety/seizures would be a medication she can use, understanding this is pregnancy category D versus risks of injuring herself from falls when she has the psychogenic seizures. Records from her previous neurologists at [facility] were reviewed. She was diagnosed with MS in June 2008 while living in [place] state, she started having right thigh pain and right eye blurred vision. She was diagnosed with right optic neuritis and underwent MRI brain and lumbar puncture which confirmed a diagnosis of MS. 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 She has been on Gilenya since 2014 without any relapse. This was discontinued  in December 2016 when she found out she was pregnant. She has a history of chronic pain and migraines, and was taking Depakote and Topamax for headache prophylaxis, also stopped in December 2016. She reports having Botox for her migraines, but so far migraines are not as bad as they were.  She started having shaking episodes in April 2016. Initially Depakote dose was increased to 500mg  BID. She was back in May 2016 with multiple episodes of syncope, palpitations, a sensation of her heart jumping out of her chest. She also reported lightheadedness, facial numbness, and numbness in her hands and feet. On her last visit in June 2016 at [facility], shaking episodes were discussed. Events always occur after a stressful event. She had a spell in [place] where she had a normal EEG and was diagnosed with a conversion disorder. She had seen Dr. Darleene Cleaver and reportedly told her he could not help her because "her case was too complex." She started seeing a psychiatrist in [place], however since she switched neurologists, she reports that he cannot see her anymore. She has not been able to establish care with [facility] and continues to have anxiety and panic attacks "all the time." She is diaphoretic in the office today and reports she always feels anxious. When she starts having one of her seizures, she would have significant anxiety, "my body can't deal and goes into a seizure," she hears her heartbeat in her ears. If it slowly builds up, she can get herself to a safe place, however other times it "just hits me," and she falls to the ground and has injured herself. She reports falling out one time in the bathroom during a doctor visit. She is currently on Fluoxetine and Buspirone. Clonazepam in the past did not help. She would usually go to the ER for these spells, which would quiet down once she gets Benadryl and Diazepam.  She delivered early at [redacted] weeks gestation last July 12, 2015. A week after delivery, she  started having numbness on the top of both feet, pain in the balls of her feet, with numbness traveling up to her mid-back. She also has numbness in both forearms, and reports her palms feel the same as her soles, with constant stabbing burning pain. She had these symptoms pre-pregnancy but states the numbness only went up to her knees. During pregnancy, all her symptoms were less bothersome. She has been following with Pain management in [place] [facility] and would occasionally take Oxycodone prior to her pregnancy.  Diagnostic Data:  I personally reviewed MRI brain with and without contrast done with [facility] last 10/02/13 which did not show any acute changes, there was multiple T2/FLAIR lesions in the subcortical and deep white matter regions, no abnormal enhancement. Hippocampi appear asymmetric, smaller on the right without abnormal signal or enhancement.  MRI C-spine with and without contrast 10/18/13 showed hazy T2 hyperintensity within the cord from C2-3 to C4-5 without abnormal enhancement.  per [facility] - MRI brain done 03/18/14: "No change in numerous multifocal T2 hyperintense white matter lesions relative to prior MRI from 04/17/2013 most consistent with demyelinating lesions given history of multiple sclerosis. No evidence of active demyelination."  MRI cervical spine done 03/18/14: "Relative to prior MRI from 10/20/2009, no change in patchy foci of increased T2 signal in the dorsal cord at C2- C4 in  keeping with demyelinating lesions in this patient with a clinical history of multiple sclerosis.  No evidence of enhancement to suggest active demyelination. Moderately advanced degenerative disease at C5-C6 and C6-C7 with interval development of left eccentric central, foraminal entry zone and foraminal disc extrusion at C6-C7 exerting mass effect on the exiting C7 and descending C8 roots.  MRI brain and C-spine 07/30/2015 with and without contrast : There was no abnormal enhancement. There were no  changes from previous MRI brain in 09/2013 with multiple bilateral subcortical greater than 10 periventricular T2 hyperintensities. There were no cord lesions seen up to T3-4. There was chronic degenerative disc disease and central canal stenosis at C5-6 and C6-7, no abnormal cord signal, unchanged from 06/2014.  EMG/NCV done on both UE and right LE showed bilateral carpal tunnel syndrome, mild to moderate in degree, no evidence of polyneuropathy or radiculopathy on the right side.   Current Outpatient Medications on File Prior to Visit  Medication Sig Dispense Refill  . AIMOVIG 140 MG/ML SOAJ Inject 140 mg into the muscle every 30 (thirty) days.     Marland Kitchen albuterol (PROVENTIL HFA;VENTOLIN HFA) 108 (90 Base) MCG/ACT inhaler Inhale 2 puffs into the lungs every 6 (six) hours as needed for wheezing or shortness of breath. (Patient taking differently: Inhale 2 puffs into the lungs every 4 (four) hours as needed for wheezing or shortness of breath. ) 1 Inhaler 0  . amLODipine (NORVASC) 5 MG tablet Take 5 mg by mouth daily.   2  . diclofenac Sodium (VOLTAREN) 1 % GEL Apply 1 application topically 2 (two) times daily as needed (knee and back pain).     Marland Kitchen diphenhydrAMINE (BENADRYL) 25 MG tablet Take 50 mg by mouth at bedtime.    . divalproex (DEPAKOTE) 500 MG DR tablet Take 1 tablet (500 mg total) by mouth at bedtime. 90 tablet 3  . dronabinol (MARINOL) 2.5 MG capsule Take 1 capsule (2.5 mg total) by mouth 3 (three) times daily. 90 capsule 5  . EPINEPHrine (EPIPEN) 0.3 mg/0.3 mL SOAJ injection Inject 0.3 mg into the muscle as needed for anaphylaxis.     Marland Kitchen etonogestrel (NEXPLANON) 68 MG IMPL implant 1 each by Subdermal route once.    . Fingolimod HCl (GILENYA) 0.5 MG CAPS Take 1 capsule (0.5 mg total) by mouth daily. 90 capsule 3  . loratadine (CLARITIN) 10 MG tablet Take 10 mg by mouth daily as needed for allergies.    . methocarbamol (ROBAXIN) 750 MG tablet Take 1 tablet (750 mg total) by mouth 2 (two) times  daily as needed for muscle spasms. (Patient taking differently: Take 750 mg by mouth 2 (two) times daily. ) 60 tablet 5  . omeprazole (PRILOSEC) 20 MG capsule TAKE 1 CAPSULE (20 MG TOTAL) BY MOUTH 2 (TWO) TIMES DAILY BEFORE A MEAL. 180 capsule 1  . OVER THE COUNTER MEDICATION Take 1-2 tablets by mouth 3 (three) times daily as needed (anxiety or pain). CBD Gummies    . oxyCODONE (ROXICODONE) 5 MG immediate release tablet Take 1 tablet (5 mg total) by mouth every 6 (six) hours as needed for severe pain. 40 tablet 0  . pregabalin (LYRICA) 100 MG capsule Take 300 mg by mouth at bedtime.    . topiramate (TOPAMAX) 100 MG tablet Take 1.5 tablets twice a day 270 tablet 3   Current Facility-Administered Medications on File Prior to Visit  Medication Dose Route Frequency Provider Last Rate Last Admin  . metoCLOPramide (REGLAN) injection 10 mg  10 mg  Intravenous Once Milton Ferguson, MD         Observations/Objective:   Vitals:   03/29/19 0855  Weight: 253 lb (114.8 kg)  Height: 5\' 4"  (1.626 m)   GEN:  The patient appears stated age and is in NAD.  Neurological examination: Patient is awake, alert, oriented x 3. No aphasia or dysarthria. Intact fluency and comprehension. Remote and recent memory intact. Cranial nerves: Extraocular movements intact with no nystagmus. No facial asymmetry. Motor: moves all extremities symmetrically, at least anti-gravity x 4.   Assessment and Plan:   This is a 39 yo RH woman with a history of relapsing-remitting multiple sclerosis, migraine with aura, anxiety with panic attacks, conversion disorder with psychogenic seizures. Her MS has been overall stable, interval MRI brain/cervical/thoracic spine done 10/2018 showed stable disease, no active demyelination or new lesions seen. She feels overall stable from a neurological standpoint. Continue Gilenya for MS. Refills for Aimovig, Depakote 500mg  qhs, and Topiramate 150mg  BID sent. She is on Lyrica from Pain Management and  diazepam 5mg  3 tablets daily for anxiety. Continue follow-up with Psychiatry and Pain Management. Follow-up in 6 months, she knows to call for any changes.    Follow Up Instructions:   -I discussed the assessment and treatment plan with the patient. The patient was provided an opportunity to ask questions and all were answered. The patient agreed with the plan and demonstrated an understanding of the instructions.   The patient was advised to call back or seek an in-person evaluation if the symptoms worsen or if the condition fails to improve as anticipated.    Cameron Sprang, MD

## 2019-03-30 ENCOUNTER — Encounter: Payer: Self-pay | Admitting: Pain Medicine

## 2019-03-30 ENCOUNTER — Ambulatory Visit: Payer: Medicare HMO | Attending: Pain Medicine | Admitting: Pain Medicine

## 2019-03-30 DIAGNOSIS — M5441 Lumbago with sciatica, right side: Secondary | ICD-10-CM

## 2019-03-30 DIAGNOSIS — M5442 Lumbago with sciatica, left side: Secondary | ICD-10-CM

## 2019-03-30 DIAGNOSIS — G8929 Other chronic pain: Secondary | ICD-10-CM

## 2019-03-30 DIAGNOSIS — M7918 Myalgia, other site: Secondary | ICD-10-CM | POA: Diagnosis not present

## 2019-03-30 DIAGNOSIS — M542 Cervicalgia: Secondary | ICD-10-CM | POA: Diagnosis not present

## 2019-03-30 DIAGNOSIS — M792 Neuralgia and neuritis, unspecified: Secondary | ICD-10-CM

## 2019-03-30 DIAGNOSIS — G894 Chronic pain syndrome: Secondary | ICD-10-CM

## 2019-03-30 MED ORDER — PREGABALIN 300 MG PO CAPS: 300.0000 mg | ORAL_CAPSULE | Freq: Every day | ORAL | 5 refills | Status: DC

## 2019-03-30 MED ORDER — METHOCARBAMOL 750 MG PO TABS: 750.0000 mg | ORAL_TABLET | Freq: Two times a day (BID) | ORAL | 5 refills | Status: DC | PRN

## 2019-03-30 NOTE — Progress Notes (Signed)
Patient: Emily [name]  Service Category: E/M  Provider: Gaspar Cola, MD  DOB: 08/17/1980  DOS: 03/30/2019  Location: Office  MRN: [id number]  Setting: Ambulatory outpatient  Referring Provider: Monico Blitz, MD  Type: Established Patient  Specialty: Interventional Pain Management  PCP: Emily Blitz, MD  Location: Remote location  Delivery: TeleHealth     Virtual Encounter - Pain Management PROVIDER NOTE: Information contained herein reflects review and annotations entered in association with encounter. Interpretation of such information and data should be left to medically-trained personnel. Information provided to patient can be located elsewhere in the medical record under "Patient Instructions". Document created using STT-dictation technology, any transcriptional errors that may result from process are unintentional.    Contact & Pharmacy Preferred: [phone] Home: [phone] (home) Mobile: [phone] (mobile) E-mail: [email or web address]  [id number] [place] [place] - [address]Phone: [phone]: [place] [address]Phone: [phone]: [phone]  Pre-screening  Emily [name] offered "in-person" vs "virtual" encounter. She indicated preferring virtual for this encounter.   Reason COVID-19*  Social distancing based on CDC and AMA recommendations.   I contacted Emily [name] on 03/30/2019 via telephone.      I clearly identified myself as FGaspar Cola MD. I verified that I was speaking with the correct person using two identifiers (Name: Emily [name], and date of birth: [birth year].  Consent I sought verbal advanced consent from [place]for virtual visit interactions. I informed Emily [name] of possible security and privacy concerns, risks, and limitations  associated with providing "not-in-person" medical evaluation and management services. I also informed Emily [name] of the availability of "in-person" appointments. Finally, I informed her that there would be a charge for the virtual visit and that she could be  personally, fully or partially, financially responsible for it. Emily [name] understanding and agreed to proceed.   Historic Elements   Emily [name] is a 39y.o. year old, female patient evaluated today after her last contact with our practice on 03/16/2019. Emily [name] has a past medical history of ADHD, Anxiety, Asthma (as a child), Chronic back pain, Chronic pain, Conversion disorder, DDD (degenerative disc disease), cervical, Depression, External hemorrhoid, Fatty liver, GERD (gastroesophageal reflux disease), History of kidney stones, HTN (hypertension) (02/27/2015), JC virus antibody positive, Migraines, MS (multiple sclerosis) ([place], Neuromuscular disorder ([place], Neuropathy, OA (osteoarthritis), Ovarian cyst, Panic attack, Perforated bowel ([place] (2009), Pseudoseizures, PTSD (post-traumatic stress disorder), S/P emergency C-section, Seasonal allergies, Urinary urgency, Vertigo, Wears contact lenses, and Wears glasses. She also  has a past surgical history that includes Extremity cyst excision (1994); Bowel resection (01/2007); Colostomy closure (04/2007); Scar revision (01/21/2011); Hernia repair; Abdominal surgery; Appendectomy; Cesarean section (N/A, 07/12/2015); Incisional hernia repair (N/A, 02/16/2016); Insertion of mesh (02/16/2016); Tooth Extraction (Left, 10/2016); Colonoscopy with propofol (N/A, 01/29/2017); Radiology with anesthesia (N/A, 03/06/2017); Exam under anesthesia with hemorrhoidectomy (N/A, 06/11/2018); Excisional hemorrhoidectomy; Transrectal drainage of pelvic abscess; Radiology with anesthesia (N/A, 09/15/2018); Radiology with anesthesia (N/A, 10/13/2018); Radiology with anesthesia (N/A, 10/26/2018); Colon surgery; ABDOMINAL WOUND  EXPLORATION  (03/19/2019); and laparotomy (03/19/2019). Emily [name] a current medication list which includes the following prescription(s): aimovig, albuterol, amlodipine, diclofenac sodium, diphenhydramine, divalproex, dronabinol, epinephrine, etonogestrel, gilenya, loratadine, methocarbamol, omeprazole, OVER THE COUNTER MEDICATION, oxycodone, pregabalin, and topiramate, and the following Facility-Administered Medications: metoclopramide.  She  reports that she quit smoking about 3 years ago. Her smoking use included cigarettes. She has a 2.50 pack-year smoking history. She has never used smokeless tobacco. She reports that she does not drink alcohol or use drugs. Emily [name] is allergic to baclofen; duloxetine hcl; gabapentin; monosodium glutamate; other; acetaminophen; alprazolam; amitriptyline; magnesium salicylate; modafinil; rizatriptan; tizanidine; vicodin [hydrocodone-acetaminophen]; adhesive [tape]; hydrocodone-acetaminophen; ketorolac tromethamine; lamotrigine; nsaids; and tramadol.   HPI  Today, she is being contacted for medication management. The patient indicates doing well with the current medication regimen. No adverse reactions or side effects reported to the medications.  The patient refers that she is still having to deal with the open abdominal wound which is healing by second intention.  They have told her that this will take many months in closing.  As soon as it does, she has voiced her interest in renewing her lower back treatments.  Pharmacotherapy Assessment  Analgesic: None from our practice due to benzodiazepine use and high risk.   Monitoring: [place] PMP: PDMP reviewed during this encounter.       Pharmacotherapy: No side-effects or adverse reactions reported. Compliance: No problems identified. Effectiveness: Clinically acceptable. Plan: Refer to "POC".  UDS:  Summary  Date Value Ref Range Status  03/16/2018 FINAL  Final    Comment:     ==================================================================== TOXASSURE COMP DRUG ANALYSIS,UR ==================================================================== Test                             Result       Flag       Units Drug Present and Declared for Prescription Verification   Desmethyldiazepam              214          EXPECTED   ng/mg creat   Oxazepam                       1241         EXPECTED   ng/mg creat   Temazepam                      1052         EXPECTED   ng/mg creat    Desmethyldiazepam, oxazepam, and temazepam are expected    metabolites of diazepam. Desmethyldiazepam and oxazepam are also    expected metabolites of other drugs, including chlordiazepoxide,    prazepam, clorazepate, and halazepam. Oxazepam is an expected    metabolite of temazepam. Oxazepam and temazepam are also    available as scheduled prescription medications.   Pregabalin                     PRESENT      EXPECTED   Topiramate                     PRESENT      EXPECTED   Methocarbamol                  PRESENT      EXPECTED   Fluoxetine                     PRESENT      EXPECTED   Norfluoxetine                  PRESENT      EXPECTED  Norfluoxetine is an expected metabolite of fluoxetine. Drug Absent but Declared for Prescription Verification   Prochlorperazine               Not Detected UNEXPECTED   Ibuprofen                      Not Detected UNEXPECTED    Ibuprofen, as indicated in the declared medication list, is not    always detected even when used as directed. ==================================================================== Test                      Result    Flag   Units      Ref Range   Creatinine              29               mg/dL      >=20 ==================================================================== Declared Medications:  The flagging and interpretation on this report are based on the  following declared medications.  Unexpected results may arise from  inaccuracies in  the declared medications.  **Note: The testing scope of this panel includes these medications:  Diazepam (Valium)  Fluoxetine (Prozac)  Methocarbamol (Robaxin)  Pregabalin (Lyrica)  Prochlorperazine (Compazine)  Topiramate (Topamax)  **Note: The testing scope of this panel does not include small to  moderate amounts of these reported medications:  Ibuprofen  **Note: The testing scope of this panel does not include following  reported medications:  Albuterol  Amlodipine (Norvasc)  Buspirone (BuSpar)  Divalproex (Depakote)  Epinephrine (EpiPen)  Etonogestrel (Nexplanon)  Fingolimod (Gilenya)  Lithium (Lithobid)  Loratadine (Claritin)  Multivitamin  Ranitidine (Zantac) ==================================================================== For clinical consultation, please call [phone]. ====================================================================    Laboratory Chemistry Profile   Renal Lab Results  Component Value Date   BUN 8 03/16/2019   CREATININE 0.80 03/16/2019   LABCREA 292.00 07/12/2015   BCR 12 08/13/2018   GFRAA >60 03/16/2019   GFRNONAA >60 03/16/2019    Hepatic Lab Results  Component Value Date   AST 27 01/13/2019   ALT 18 01/13/2019   ALBUMIN 3.9 01/13/2019   ALKPHOS 79 01/13/2019   LIPASE 41 07/12/2015    Electrolytes Lab Results  Component Value Date   NA 141 03/16/2019   K 3.4 (L) 03/16/2019   CL 108 03/16/2019   CALCIUM 9.1 03/16/2019   MG 2.2 03/16/2018    Bone Lab Results  Component Value Date   25OHVITD1 20 (L) 03/16/2018   25OHVITD2 4.7 03/16/2018   25OHVITD3 15 03/16/2018    Inflammation (CRP: Acute Phase) (ESR: Chronic Phase) Lab Results  Component Value Date   CRP 6 03/16/2018   ESRSEDRATE 20 03/16/2018   LATICACIDVEN 1.0 11/12/2018      Note: Above Lab results reviewed.  Imaging  CT Abdomen Pelvis W Contrast CLINICAL DATA:  Fistula tract from infected ventral hernia mesh. Increasing pain. Concern for  abscess.  EXAM: CT ABDOMEN AND PELVIS WITH CONTRAST  TECHNIQUE: Multidetector CT imaging of the abdomen and pelvis was performed using the standard protocol following bolus administration of intravenous contrast.  CONTRAST:  120m OMNIPAQUE IOHEXOL 300 MG/ML  SOLN  COMPARISON:  December 11, 2018  FINDINGS: Lower chest: The lung bases are clear. The heart size is normal.  Hepatobiliary: The liver is normal. Normal gallbladder.There is no biliary ductal dilation.  Pancreas: Normal contours without ductal dilatation. No peripancreatic fluid collection.  Spleen: No splenic laceration or hematoma.  Adrenals/Urinary Tract:  --  Adrenal glands: No adrenal hemorrhage.  --Right kidney/ureter: There is a punctate nonobstructing stone in the lower pole right kidney. There is a stable hypodense 1.7 cm lesion in the upper pole, favored to represent a hemorrhagic cyst although incompletely characterized on this exam.  --Left kidney/ureter: No hydronephrosis or perinephric hematoma.  --Urinary bladder: Unremarkable.  Stomach/Bowel:  --Stomach/Duodenum: No hiatal hernia or other gastric abnormality. Normal duodenal course and caliber.  --Small bowel: No dilatation or inflammation.  --Colon: No focal abnormality.  --Appendix: Surgically absent.  Vascular/Lymphatic: Normal course and caliber of the major abdominal vessels.  --No retroperitoneal lymphadenopathy.  --No mesenteric lymphadenopathy.  --No pelvic or inguinal lymphadenopathy.  Reproductive: Unremarkable  Other: No ascites or free air. Again noted is scarring in the low midline abdomen. In the infraumbilical region there again is a soft tissue density measuring approximately 5.4 by 2 by 4.6 cm. There are few pockets of gas within this collection. There appears to be a sinus tract to the cutaneous surface. Inferior to this collection is an additional soft tissue density area measuring 4.2 x 1.5 cm. Overall, this  appearance is not changed significantly since the prior study. There are few loops of small bowel tethered to the anterior abdominal wall. There is no oral contrast within the above findings or tracking to the cutaneous surface.  Musculoskeletal. No acute displaced fractures.  IMPRESSION: 1. 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 There is no drainable fluid collection. These findings have not significantly changed since prior study dated 12/11/2018. 2. No bowel obstruction. 3. Nonobstructing right-sided nephrolithiasis. 4. Again noted is an incompletely characterized hypodense lesion in the right kidney. Follow-up with a nonemergent outpatient MRI is recommended.  Electronically Signed   By: Constance Holster M.D.   On: 12/26/2018 20:07  Assessment  The primary encounter diagnosis was Chronic pain syndrome. Diagnoses of Chronic neck pain (Primary Area of Pain) (Bilateral) (R>L), Chronic low back pain (Secondary Area of Pain) (Bilateral) (L>R), Chronic musculoskeletal pain, and Neurogenic pain were also pertinent to this visit.  Plan of Care  Problem-specific:  No problem-specific Assessment & Plan notes found for this encounter.  Emily [name] has a current medication list which includes the following long-term medication(s): albuterol, divalproex, etonogestrel, gilenya, methocarbamol, omeprazole, pregabalin, and topiramate.  Pharmacotherapy (Medications Ordered): Meds ordered this encounter  Medications  . methocarbamol (ROBAXIN) 750 MG tablet    Sig: Take 1 tablet (750 mg total) by mouth 2 (two) times daily as needed for muscle spasms.    Dispense:  60 tablet    Refill:  5    Fill one day early if pharmacy is closed on scheduled refill date. May substitute for generic if available.  . pregabalin (LYRICA) 300 MG capsule    Sig: Take 1 capsule (300 mg total) by mouth at bedtime.    Dispense:  30 capsule    Refill:  5     Fill one day early if pharmacy is closed on scheduled refill date. May substitute for generic, or similar, if available.   Orders:  No orders of the defined types were placed in this encounter.  Follow-up plan:   Return in about 6 months (around 09/22/2019) for (VV), (MM).      Interventional management options: Considering:   NOTE: Hold until complete closure and resolution of abdominal wound infection.  Diagnosticmidline thoracic ESI Diagnosticbilateral thoracic facet block  Possiblebilateral thoracic RFA Diagnostic right sided LESI Therapeutic bilateral lumbar facet RFA #1  Diagnosticleft IA knee injection Diagnosticleft knee genicular NB Possibleleft genicular nerve RFA  Diagnosticright-sided CESI Diagnosticright-sided cervical facet block Diagnosticright-sided cervical facet RFA Diagnosticright occipital NB   Palliative PRN treatment(s):   NOTE: No procedures will be scheduled until the patient fully heals from her infected abdominal wound. Palliative bilateral lumbar facet block #3     Recent Visits No visits were found meeting these conditions.  Showing recent visits within past 90 days and meeting all other requirements   Today's Visits Date Type Provider Dept  03/30/19 Telemedicine Milinda Pointer, MD [facility]  Showing today's visits and meeting all other requirements   Future Appointments No visits were found meeting these conditions.  Showing future appointments within next 90 days and meeting all other requirements   I discussed the assessment and treatment plan with the patient. The patient was provided an opportunity to ask questions and all were answered. The patient agreed with the plan and demonstrated an understanding of the instructions.  Patient advised to call back or seek an in-person evaluation if the symptoms or condition worsens.  Duration of encounter: 13 minutes.  Note by: Emily Cola, MD Date:  03/30/2019; Time: 4:53 PM

## 2019-05-16 ENCOUNTER — Ambulatory Visit (INDEPENDENT_AMBULATORY_CARE_PROVIDER_SITE_OTHER)
Admission: RE | Admit: 2019-05-16 | Discharge: 2019-05-16 | Disposition: A | Discharge status: Home / Self Care | Payer: Medicare Other | Source: Ambulatory Visit

## 2019-05-16 ENCOUNTER — Emergency Department (HOSPITAL_COMMUNITY)
Admission: EM | Admit: 2019-05-16 | Discharge: 2019-05-16 | Disposition: A | Discharge status: Home / Self Care | Payer: Medicare Other | Attending: Emergency Medicine | Admitting: Emergency Medicine

## 2019-05-16 ENCOUNTER — Other Ambulatory Visit: Payer: Self-pay

## 2019-05-16 ENCOUNTER — Encounter (HOSPITAL_COMMUNITY): Payer: Self-pay | Admitting: Emergency Medicine

## 2019-05-16 DIAGNOSIS — Z79899 Other long term (current) drug therapy: Secondary | ICD-10-CM | POA: Diagnosis not present

## 2019-05-16 DIAGNOSIS — M79641 Pain in right hand: Secondary | ICD-10-CM | POA: Diagnosis not present

## 2019-05-16 DIAGNOSIS — G9009 Other idiopathic peripheral autonomic neuropathy: Secondary | ICD-10-CM | POA: Diagnosis not present

## 2019-05-16 DIAGNOSIS — M79669 Pain in unspecified lower leg: Secondary | ICD-10-CM | POA: Diagnosis present

## 2019-05-16 DIAGNOSIS — R209 Unspecified disturbances of skin sensation: Secondary | ICD-10-CM

## 2019-05-16 DIAGNOSIS — M79672 Pain in left foot: Secondary | ICD-10-CM | POA: Diagnosis not present

## 2019-05-16 DIAGNOSIS — G629 Polyneuropathy, unspecified: Secondary | ICD-10-CM

## 2019-05-16 DIAGNOSIS — M79671 Pain in right foot: Secondary | ICD-10-CM

## 2019-05-16 DIAGNOSIS — Z87891 Personal history of nicotine dependence: Secondary | ICD-10-CM | POA: Insufficient documentation

## 2019-05-16 DIAGNOSIS — G35 Multiple sclerosis: Secondary | ICD-10-CM | POA: Insufficient documentation

## 2019-05-16 DIAGNOSIS — I10 Essential (primary) hypertension: Secondary | ICD-10-CM | POA: Insufficient documentation

## 2019-05-16 DIAGNOSIS — M79642 Pain in left hand: Secondary | ICD-10-CM

## 2019-05-16 LAB — BASIC METABOLIC PANEL
Anion gap: 9 (ref 5–15)
BUN: 8 mg/dL (ref 6–20)
CO2: 24 mmol/L (ref 22–32)
Calcium: 8.4 mg/dL — ABNORMAL LOW (ref 8.9–10.3)
Chloride: 106 mmol/L (ref 98–111)
Creatinine, Ser: 0.61 mg/dL (ref 0.44–1.00)
GFR calc Af Amer: >60 mL/min (ref >60)
GFR calc non Af Amer: >60 mL/min (ref >60)
Glucose, Bld: 92 mg/dL (ref 70–99)
Potassium: 3.8 mmol/L (ref 3.5–5.1)
Sodium: 139 mmol/L (ref 135–145)

## 2019-05-16 LAB — CBC
HCT: 44.2 % (ref 36.0–46.0)
Hemoglobin: 14.3 g/dL (ref 12.0–15.0)
MCH: 28.7 pg (ref 26.0–34.0)
MCHC: 32.4 g/dL (ref 30.0–36.0)
MCV: 88.6 fL (ref 80.0–100.0)
Platelets: 393 10*3/uL (ref 150–400)
RBC: 4.99 MIL/uL (ref 3.87–5.11)
RDW: 14.5 % (ref 11.5–15.5)
WBC: 7.7 10*3/uL (ref 4.0–10.5)
nRBC: 0 % (ref 0.0–0.2)

## 2019-05-16 LAB — VITAMIN B12: Vitamin B-12: 322 pg/mL (ref 180–914)

## 2019-05-16 MED ORDER — PREDNISONE 20 MG PO TABS: 20.0000 mg | ORAL_TABLET | Freq: Every day | ORAL | 0 refills | Status: AC

## 2019-05-16 MED ORDER — DEXAMETHASONE SODIUM PHOSPHATE 10 MG/ML IJ SOLN
10.0000 mg | Freq: Once | INTRAMUSCULAR | Status: AC
  Administered 2019-05-16: 10 mg via INTRAMUSCULAR
  Filled 2019-05-16: qty 1

## 2019-05-16 MED ORDER — OXYCODONE HCL 5 MG PO TABS
10.0000 mg | ORAL_TABLET | Freq: Once | ORAL | Status: AC
  Administered 2019-05-16: 10 mg via ORAL
  Filled 2019-05-16: qty 2

## 2019-05-16 NOTE — Discharge Instructions (Signed)
Please take the steroids, as prescribed.  I recommend that you take them in the morning with breakfast.  It is vitally important that you call your neurologist, Dr. Delice Lesch, tomorrow morning regarding today's encounter.  You will need to schedule appointment for ongoing evaluation and management.  Please continue to work with your pain management clinic for ongoing support for your chronic pain syndrome and fibromyalgia.  Please return to the ED or seek immediate medical attention should you experience any new or worsening symptoms.

## 2019-05-16 NOTE — ED Triage Notes (Signed)
Seen earlier at Urgent care and discharged   Here for evaluation of bilateral leg pain   Ambulated from parking lot with easy gait   Asked for Wheelchair in registration  Now speak of leg pain and hand burning  Here for eval   "I'm really hurting"

## 2019-05-16 NOTE — ED Triage Notes (Signed)
Pt reports "I'm not in a pain contract at the moment"

## 2019-05-16 NOTE — ED Provider Notes (Signed)
Virtual Visit via Video Note:  Emily [name]  initiated request for Telemedicine visit with [facility] team. I connected with Emily [name]  on 05/16/2019 at 3:04 PM  for a synchronized telemedicine visit using a video enabled HIPPA compliant telemedicine application. I verified that I am speaking with Emily [name]  using two identifiers. Emily Hall-Potvin, PA-C  was physically located in a [facility] site and Emily [name] was located at a different location.   The limitations of evaluation and management by telemedicine as well as the availability of in-person appointments were discussed. Patient was informed that she  may incur a bill ( including co-pay) for this virtual visit encounter. Emily [name]  expressed understanding and gave verbal consent to proceed with virtual visit.     History of Present Illness:Emily [name]  is a 39 y.o. female presents with cold hands and feet since this morning.  Also endorsing a burning sensation.  Denies trauma or injury to either areas.  No cyanosis or pallor.  Denies history of DVT, PE.  Patient did have history of superficial thrombophlebitis of upper extremity: Use heating pads, as she has ADRs to NSAIDs, with resolution.  Patient denies chest pain, difficulty breathing, lower leg swelling.  No recent travel, prolonged stasis.  Patient did undergo hernia mesh repair on 3/12: States hole is almost closed and is without pain, discharge, redness.  Patient has been feeling a little bit lightheaded, dizzy: No syncopal events.  Has tried stretching without relief.    ROI as per HPI  Past Medical History:  Diagnosis Date  . ADHD   . Anxiety   . Asthma as a child  . Chronic back pain   . Chronic pain   . Conversion disorder   . DDD (degenerative disc disease), cervical   . Depression   . External hemorrhoid   . Fatty liver   . GERD (gastroesophageal reflux disease)   . History of kidney stones   . HTN  (hypertension) 02/27/2015  . JC virus antibody positive   . Migraines   . MS (multiple sclerosis) ([place])    06-2006  . Neuromuscular disorder ([place])    MS  . Neuropathy   . OA (osteoarthritis)   . Ovarian cyst   . Panic attack   . Perforated bowel ([place]) 2009   colostomy bag for 3 motnhs  . 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 PTSD (post-traumatic stress disorder)   . S/P emergency C-section   . Seasonal allergies   . Urinary urgency   . Vertigo   . Wears contact lenses   . Wears glasses     Allergies  Allergen Reactions  . Baclofen Hives and Shortness Of Breath  . Duloxetine Hcl Shortness Of Breath and Rash  . Gabapentin Shortness Of Breath and Rash  . Monosodium Glutamate Anaphylaxis  . Other Shortness Of Breath, Rash and Other (See Comments)    MSG, beans (vomiting) MSG causes hives, itching, throat swelling  . Acetaminophen Nausea And Vomiting    Projectile vomiting  . Alprazolam Other (See Comments)    Lethargy  . Amitriptyline Other (See Comments) and Hypertension    hypertension  . Magnesium Salicylate Hives and Itching  . Modafinil Other (See Comments)    Seizures   . Rizatriptan Nausea And Vomiting and Other (See Comments)    GI upset, Projectile vomiting GI upset, Projectile vomiting  .  Tizanidine Hives    Other reaction(s): GI Upset (intolerance)  . Vicodin [Hydrocodone-Acetaminophen] Hives, Nausea And Vomiting and Other (See Comments)    Projectile vomiting  . Adhesive [Tape] Other (See Comments)    Skin irritation Paper tape is ok  . Hydrocodone-Acetaminophen Hives and Nausea And Vomiting    Projectile vomiting  . Ketorolac Tromethamine Nausea Only    Toradol   . Lamotrigine Rash  . Nsaids Other (See Comments)    Irritate stomach   . Tramadol Nausea And Vomiting    Other reaction(s): GI Upset (intolerance)        Observations/Objective: 39 year old female sitting in no acute distress.  Patient is able to speak  in full sentences without coughing, sneezing, wheezing.  No obvious discoloration of hands or feet.  Assessment and Plan: Patient has bilateral hand and foot coldness.  No history of Raynaud's or vascular disease.  Patient did have single superficial phlebitis which resolved weeks before symptom onset.  No injury.  Patient does have home scription for opioid, though states she is not under a pain contract through her pain clinic.  Has numerous allergies and ADRs to pain medications.  Very limited exam through video: Recommended she try warm water, heating pad for 10 minutes to see if this helps.  If patient has worsening pain or discoloration, she should seek in-person evaluation for more thorough exam.  Return precautions discussed, patient verbalized understanding and is agreeable to plan.  Follow Up Instructions: Patient to seek in-person evaluation for persistent or worsening symptoms.   I discussed the assessment and treatment plan with the patient. The patient was provided an opportunity to ask questions and all were answered. The patient agreed with the plan and demonstrated an understanding of the instructions.   The patient was advised to call back or seek an in-person evaluation if the symptoms worsen or if the condition fails to improve as anticipated.  I provided 15 minutes of non-face-to-face time during this encounter.    [place], PA-C  05/16/2019 3:04 PM        [name], [place], PA-C 05/16/19 1504

## 2019-05-16 NOTE — ED Provider Notes (Signed)
[name] EMERGENCY DEPARTMENT Provider Note   CSN: [id number] Arrival date & time: 05/16/19  1410     History Chief Complaint  Patient presents with  . bilateral leg pain    Emily [name] is a 39 y.o. female with PMH significant for chronic pain syndrome, MS, pseudoseizures, anxiety, and neuropathy who presents to the ED with complaints of bilateral leg pain and hand burning symptoms.  Patient had a video visit with an urgent care this morning, prior to arrival.  She is relatively well-known to the department for her chronic pain symptoms and has a care plan in place.  Patient states that she sustained a mechanical fall while at the pharmacy on 05/14/2019 where she landed on her knees.  She states that ever since then she has been experiencing peripheral "burning" sensation involving her distal arms and distal legs.  She states that she has had these symptoms in the past, however they are scaring her because they are very painful.  She understands that this is likely neurologic in etiology, however she called the office of her neurologist and they are closed for the weekend.  They recommended urgent care evaluation.  The urgent care provider with whom she spoke recommended applying heat to the affected areas, however she states that it failed to improve her symptoms.  She has a 13-year-old daughter and she is afraid that she will not be able to provide adequate care due to her pain symptoms.  She is particularly anxious on exam.  She is requesting steroids as they have helped in the past.  She denies any fevers or chills, chest pain or shortness of breath, weakness or numbness, inability to ambulate, diminished ROM or strength, rash or other overlying skin changes, or other symptoms.  HPI     Past Medical History:  Diagnosis Date  . ADHD   . Anxiety   . Asthma as a child  . Chronic back pain   . Chronic pain   . Conversion disorder   . DDD (degenerative disc disease), cervical   .  Depression   . External hemorrhoid   . Fatty liver   . GERD (gastroesophageal reflux disease)   . History of kidney stones   . HTN (hypertension) 02/27/2015  . JC virus antibody positive   . Migraines   . MS (multiple sclerosis) ([place])    06-2006  . Neuromuscular disorder ([place])    MS  . Neuropathy   . OA (osteoarthritis)   . Ovarian cyst   . Panic attack   . Perforated bowel ([place]) 2009   colostomy bag for 3 motnhs  . 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 PTSD (post-traumatic stress disorder)   . S/P emergency C-section   . Seasonal allergies   . Urinary urgency   . Vertigo   . Wears contact lenses   . Wears glasses     Patient Active Problem List   Diagnosis Date Noted  . Infected hernioplasty mesh ([place]) 03/19/2019  . 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 ADHD 02/22/2019  . Anxiety 02/22/2019  . S/P emergency C-section   . MS (multiple sclerosis) ([place])   . Chronic pain   . Panic attack   . Open wound of abdominal wall, anterior, complicated, sequela [id number]  . Postoperative wound infection 09/20/2018  . Acute postoperative pain 08/25/2018  . History of substance use disorder 07/19/2018  . Nausea without vomiting 04/13/2018  . Nausea with vomiting 03/23/2018  .  Pharmacologic therapy 03/16/2018  . Disorder of skeletal system 03/16/2018  . Problems influencing health status 03/16/2018  . Abnormal MRI, cervical spine (03/06/17) 03/16/2018  . Morbid obesity with BMI of 45.0-49.9, adult ([place]) 03/16/2018  . Chronic pain of both knees 02/18/2018  . Cervical facet hypertrophy 04/29/2017  . Cervical facet syndrome (Bilateral) (R>L) 04/29/2017  . Spondylosis without myelopathy or radiculopathy, cervical region 04/29/2017  . Spondylosis without myelopathy or radiculopathy, lumbosacral region 03/04/2017  . Cervicogenic headache 02/10/2017  . Occipital headache 02/10/2017  . Trigger point with back pain (Left) 02/10/2017  . History of fainting  (vasovagal) 02/10/2017  . History of vasovagal syncope 02/10/2017  . Acute left-sided low back pain without sciatica 02/10/2017  . Neurogenic pain 01/23/2017  . Chronic musculoskeletal pain 12/02/2016  . Cervical radiculitis (Bilateral) 11/12/2016  . Chronic upper back pain (Primary Area of Pain) (Bilateral) (R>L) 11/01/2016  . Chronic hand pain [facility] Area of Pain) (Bilateral) (L>R) 11/01/2016  . Chronic wrist pain [facility] Area of Pain) (Bilateral) (L>R) 11/01/2016  . Chronic foot/toes pain (Fourth Area of Pain) (Bilateral) (R>L) 11/01/2016  . Chronic upper extremity pain (Secondary Area of Pain) (Bilateral) (L>R) 11/01/2016  . Long term prescription benzodiazepine use 11/01/2016  . 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 Cervical foraminal stenosis (Bilateral) (C6-7) 11/01/2016  . 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 DDD (degenerative disc disease), thoracic 11/01/2016  . DDD (degenerative disc disease), lumbar 11/01/2016  . Lumbar facet arthropathy 11/01/2016  . Lumbar facet syndrome (Bilateral) (R>L) 11/01/2016  . Vitamin D deficiency 10/28/2016  . Opiate use 07/23/2016  . Chronic neck pain (Primary Area of Pain) (Bilateral) (R>L) 07/23/2016  . Chronic low back pain (Secondary Area of Pain) (Bilateral) (L>R) 07/23/2016  . Chronic knee pain (Fifth Area of Pain) (Left) 07/23/2016  . Midline thoracic back pain 07/23/2016  . Chronic lower extremity pain (Bilateral) 07/23/2016  . Incarcerated incisional hernia 02/16/2016  . Incisional hernia 02/15/2016  . Carpal tunnel syndrome [facility] Area of Pain) (Bilateral) (L>R) 11/13/2015  . Substance abuse ([place]) 09/07/2015  . Paresthesia 08/02/2015  . Chronic pain syndrome 08/02/2015  . Postpartum endometritis 07/21/2015  . PTSD (post-traumatic stress disorder) 05/16/2015  . Severe episode of recurrent major depressive disorder, without psychotic features ([place]) 05/16/2015  . GAD (generalized  anxiety disorder) 05/16/2015  . Difficult intravenous access 05/08/2015  . Right optic neuritis 04/27/2015  . Conversion disorder with attacks or seizures, persistent, with psychological stressor 03/28/2015  . Migraine with aura and without status migrainosus, not intractable 03/28/2015  . Generalized anxiety disorder 03/28/2015  . Conversion disorder 03/28/2015  . HTN (hypertension) 02/27/2015  . Perforated bowel ([place]) 02/27/2015  . Depression 12/27/2014  . Multiple sclerosis ([place]) 12/27/2014  . Migraine 12/27/2014  . Seizure disorder ([place]) 12/27/2014  . Smoker 12/27/2014  . Obesity 12/27/2014  . DDD (degenerative disc disease), cervical 07/28/2014  . Major depressive disorder, single episode 11/02/2013  . Laryngopharyngeal reflux 10/25/2010  . Tonsillitis 10/25/2010    Past Surgical History:  Procedure Laterality Date  . ABDOMINAL SURGERY    . ABDOMINAL WOUND EXPLORATION   03/19/2019   Abdominal Wound Exploration with Explantation of Mesh  . APPENDECTOMY    . BOWEL RESECTION  01/2007   with colostomy  . CESAREAN SECTION N/A 07/12/2015   Procedure: CESAREAN SECTION;  Surgeon: Guss Bunde, MD;  Location: [place];  Service: Obstetrics;  Laterality: N/A;  . COLON SURGERY     part of colon and  intestines removed; colostomy 2008  . COLONOSCOPY WITH PROPOFOL N/A 01/29/2017   Procedure: COLONOSCOPY WITH PROPOFOL;  Surgeon: Ileana Roup, MD;  Location: [facility];  Service: General;  Laterality: N/A;  . COLOSTOMY CLOSURE  04/2007  . EVALUATION UNDER ANESTHESIA WITH HEMORRHOIDECTOMY N/A 06/11/2018   Procedure: ANORECTAL EXAM UNDER ANESTHESIA WITH HEMORRHOIDECTOMY;  Surgeon: Ileana Roup, MD;  Location: [place];  Service: General;  Laterality: N/A;  . EXCISIONAL HEMORRHOIDECTOMY    . EXTREMITY CYST EXCISION  1994   right leg  . HERNIA REPAIR    . INCISIONAL HERNIA REPAIR N/A 02/16/2016   Procedure: HERNIA REPAIR INCISIONAL;  Surgeon:  Fanny Skates, MD;  Location: [facility];  Service: General;  Laterality: N/A;  . INSERTION OF MESH  02/16/2016   Procedure: INSERTION OF MESH;  Surgeon: Fanny Skates, MD;  Location: [facility];  Service: General;;  . LAPAROTOMY  03/19/2019   Procedure: Abdominal Wound Exploration with Explantation of Mesh;  Surgeon: Ralene Ok, MD;  Location: [place];  Service: General;;  . RADIOLOGY WITH ANESTHESIA N/A 03/06/2017   Procedure: MRI WITH ANESTHESIA OF CERVICAL SPINE WITHOUT CONTRAST, MRI OF LUMBAR SPINE WITHOUT CONTRAST;  Surgeon: Radiologist, Medication, MD;  Location: [place];  Service: Radiology;  Laterality: N/A;  . RADIOLOGY WITH ANESTHESIA N/A 09/15/2018   Procedure: MRI OF BRAIN WITH AND WITHOUT CONTRAST;  Surgeon: Radiologist, Medication, MD;  Location: [place];  Service: Radiology;  Laterality: N/A;  . RADIOLOGY WITH ANESTHESIA N/A 10/13/2018   Procedure: MRI OF BRAIN WITH AND WITHOUT CONTRAST;  Surgeon: Radiologist, Medication, MD;  Location: [place];  Service: Radiology;  Laterality: N/A;  . RADIOLOGY WITH ANESTHESIA N/A 10/26/2018   Procedure: MRI UNDER ANESTHESIA; HEAD, CERVICAL AND THORASCIC SPINE;  Surgeon: Radiologist, Medication, MD;  Location: [place];  Service: Radiology;  Laterality: N/A;  . SCAR REVISION  01/21/2011   Procedure: SCAR REVISION;  Surgeon: Hermelinda Dellen;  Location: [place];  Service: Plastics;  Laterality: N/A;  exploration of scar of abdomen and repair of defect  . TOOTH EXTRACTION Left 10/2016  . TRANSRECTAL DRAINAGE OF PELVIC ABSCESS       OB History    Gravida  1   Para  1   Term      Preterm  1   AB      Living  1     SAB      TAB      Ectopic      Multiple  0   Live Births  1           Family History  Problem Relation Age of Onset  . Diabetes Mother   . Hypertension Mother   . Diabetes Father   . Hypertension Father   . Arthritis Father   . Cancer Maternal Grandmother   . Cancer Maternal Grandfather   . Alcohol  abuse Neg Hx   . Anxiety disorder Neg Hx   . Bipolar disorder Neg Hx   . Drug abuse Neg Hx   . Depression Neg Hx   . Colon cancer Neg Hx     Social History   Tobacco Use  . Smoking status: Former Smoker    Packs/day: 0.25    Years: 10.00    Pack years: 2.50    Types: Cigarettes    Quit date: 07/12/2015    Years since quitting: 3.8  . Smokeless tobacco: Never Used  Substance Use Topics  . Alcohol use: No  Alcohol/week: 0.0 standard drinks  . Drug use: No    Comment: marinol shows up as THC    Home Medications Prior to Admission medications   Medication Sig Start Date End Date Taking? Authorizing Provider  AIMOVIG 140 MG/ML SOAJ Inject 140 mg into the muscle every 30 (thirty) days. 03/29/19   Cameron Sprang, MD  albuterol (PROVENTIL HFA;VENTOLIN HFA) 108 (90 Base) MCG/ACT inhaler Inhale 2 puffs into the lungs every 6 (six) hours as needed for wheezing or shortness of breath. Patient taking differently: Inhale 2 puffs into the lungs every 4 (four) hours as needed for wheezing or shortness of breath.  02/15/15   Jaclyn Prime, Collene Leyden, PA-C  amLODipine (NORVASC) 5 MG tablet Take 5 mg by mouth daily.  01/23/16   [provider]  diclofenac Sodium (VOLTAREN) 1 % GEL Apply 1 application topically 2 (two) times daily as needed (knee and back pain).     [provider]  diphenhydrAMINE (BENADRYL) 25 MG tablet Take 50 mg by mouth at bedtime.    [provider]  divalproex (DEPAKOTE) 500 MG DR tablet Take 1 tablet (500 mg total) by mouth at bedtime. 03/29/19   Cameron Sprang, MD  dronabinol (MARINOL) 2.5 MG capsule Take 1 capsule (2.5 mg total) by mouth 3 (three) times daily. 12/01/18   Annitta Needs, NP  EPINEPHrine (EPIPEN) 0.3 mg/0.3 mL SOAJ injection Inject 0.3 mg into the muscle as needed for anaphylaxis.     [provider]  etonogestrel (NEXPLANON) 68 MG IMPL implant 1 each by Subdermal route once.    [provider]  Fingolimod HCl  (GILENYA) 0.5 MG CAPS Take 1 capsule (0.5 mg total) by mouth daily. 03/29/19   Cameron Sprang, MD  loratadine (CLARITIN) 10 MG tablet Take 10 mg by mouth daily as needed for allergies.    [provider]  methocarbamol (ROBAXIN) 750 MG tablet Take 1 tablet (750 mg total) by mouth 2 (two) times daily as needed for muscle spasms. 03/30/19 09/26/19  Milinda Pointer, MD  omeprazole (PRILOSEC) 20 MG capsule TAKE 1 CAPSULE (20 MG TOTAL) BY MOUTH 2 (TWO) TIMES DAILY BEFORE A MEAL. 06/18/18   Annitta Needs, NP  OVER THE COUNTER MEDICATION Take 1-2 tablets by mouth 3 (three) times daily as needed (anxiety or pain). CBD Gummies    [provider]  oxyCODONE (ROXICODONE) 5 MG immediate release tablet Take 1 tablet (5 mg total) by mouth every 6 (six) hours as needed for severe pain. 03/20/19   Norm Parcel, PA-C  predniSONE (DELTASONE) 20 MG tablet Take 1 tablet (20 mg total) by mouth daily for 7 days. 05/16/19 05/23/19  Corena Herter, PA-C  pregabalin (LYRICA) 300 MG capsule Take 1 capsule (300 mg total) by mouth at bedtime. 03/30/19 09/26/19  Milinda Pointer, MD  topiramate (TOPAMAX) 100 MG tablet Take 1.5 tablets twice a day 03/29/19   Cameron Sprang, MD    Allergies    Baclofen, Duloxetine hcl, Gabapentin, Monosodium glutamate, Other, Acetaminophen, Alprazolam, Amitriptyline, Magnesium salicylate, Modafinil, Rizatriptan, Tizanidine, Vicodin [hydrocodone-acetaminophen], Adhesive [tape], Hydrocodone-acetaminophen, Ketorolac tromethamine, Lamotrigine, Nsaids, and Tramadol  Review of Systems   Review of Systems  All other systems reviewed and are negative.   Physical Exam Updated Vital Signs BP (!) 149/103 (BP Location: Left Arm)   Pulse 95   Temp 98.5 F (36.9 C) (Oral)   Resp 18   Ht 5\' 3"  (1.6 m)   Wt 114.8 kg   SpO2  99%   BMI 44.82 kg/m   Physical Exam Vitals and nursing note reviewed. Exam conducted with a chaperone present.  Constitutional:      Appearance: Normal  appearance.  HENT:     Head: Normocephalic and atraumatic.  Eyes:     General: No scleral icterus.    Conjunctiva/sclera: Conjunctivae normal.  Cardiovascular:     Rate and Rhythm: Normal rate and regular rhythm.     Pulses: Normal pulses.     Heart sounds: Normal heart sounds.  Pulmonary:     Effort: Pulmonary effort is normal.     Breath sounds: Normal breath sounds.  Musculoskeletal:     Cervical back: Normal range of motion and neck supple. No rigidity.  Skin:    General: Skin is dry.     Comments: No rash or other overlying skin changes.  Neurological:     Mental Status: She is alert.     GCS: GCS eye subscore is 4. GCS verbal subscore is 5. GCS motor subscore is 6.     Comments: CN II through XII gross intact.  No focal deficits.  Able to ambulate without ataxia or other limitation.  Demonstrates full ROM and strength intact.  No significant sensation to light touch.  Psychiatric:        Behavior: Behavior normal.        Thought Content: Thought content normal.     Comments: Anxious.     ED Results / Procedures / Treatments   Labs (all labs ordered are listed, but only abnormal results are displayed) Labs Reviewed  BASIC METABOLIC PANEL - Abnormal; Notable for the following components:      Result Value   Calcium 8.4 (*)    All other components within normal limits  CBC  VITAMIN B12    EKG None  Radiology No results found.  Procedures Procedures (including critical care time)  Medications Ordered in ED Medications  oxyCODONE (Oxy IR/ROXICODONE) immediate release tablet 10 mg (has no administration in time range)  dexamethasone (DECADRON) injection 10 mg (10 mg Intramuscular Given 05/16/19 1602)    ED Course  I have reviewed the triage vital signs and the nursing notes.  Pertinent labs & imaging results that were available during my care of the patient were reviewed by me and considered in my medical decision making (see chart for details).    MDM  Rules/Calculators/A&P                      Patient's history and physical exam is consistent with stocking-glove pattern peripheral neuropathy.  Patient already reports taking the maximum dose for Lyrica.  In accordance with patient's care plan, will avoid any neuroimaging and she should be considered for discharge to neurology and/or pain management for her chronic pain/chronic neurologic complaints.  She has and continues to receive extensive MRI imaging of her brain spine for her MS. there is no significant trauma and she denies any saddle anesthesia or incontinence symptoms.  Her extremities are warm and distal pulses intact throughout.  She is neurologically intact without any focal deficits.  She can ambulate without difficulty.  She likely is experiencing acute on chronic symptoms related to her MS, however she also states that it feels similar to her prior episodes of fibromyalgia.  She states that she is still in the process of finding the right pain management clinic since returning to the area from [place], [place].    On reexamination, patient reports that she  has been seen multiple times in the past for similar bilateral leg discomfort and treated with a medication here in the past with good effect.  Given her known multiple sclerosis, will treat with Decadron IM here in the ED and obtain CBC and BMP to assess for any electrolyte derangement.  Will also obtain Vitamin B12 as that could cause stocking-glove peripheral neuropathy, however do not feel as though we need to wait for lab to return given that her history much more likely to be related to her multiple sclerosis.  Will treat with steroids for possible relapse.    On subsequent evaluation, patient continues to endorse significant pain despite feeling improved from the Decadron IM.  While I explained the importance of steroids for treating the root of her discomfort, felt it was reasonable to treat her pain with one dose of analgesics here in  the ED.  Her father is driving her. Patient was reassured by the laboratory work-up.  Strict ED return precautions discussed with the patient.  All of the evaluation and work-up results were discussed with the patient and any family at bedside. They were provided opportunity to ask any additional questions and have none at this time. They have expressed understanding of verbal discharge instructions as well as return precautions and are agreeable to the plan.   Final Clinical Impression(s) / ED Diagnoses Final diagnoses:  Neuropathy    Rx / DC Orders ED Discharge Orders         Ordered    predniSONE (DELTASONE) 20 MG tablet  Daily     05/16/19 1657           Corena Herter, PA-C 05/16/19 1658    Milton Ferguson, MD 05/17/19 [id number]

## 2019-05-16 NOTE — Discharge Instructions (Addendum)
Use heating pad or warm water to see if this helps with your cold sensation and pain. If not, you should seek in-person evaluation for an in person exam.

## 2019-05-17 ENCOUNTER — Telehealth: Payer: Self-pay | Admitting: Neurology

## 2019-05-17 NOTE — Telephone Encounter (Signed)
Pt went to the ED this weekend for the burning and pain in the hands and feet. They told her to to call the Pain management provider and [name]. She would like to speak to someone  Please call

## 2019-05-17 NOTE — Telephone Encounter (Signed)
No answer [phone]

## 2019-05-18 NOTE — Telephone Encounter (Signed)
No answer at 10:09am 

## 2019-05-18 NOTE — Telephone Encounter (Signed)
Pls let her know I reviewed ER notes, agree with Pain Management follow-up at this point, thanks

## 2019-05-18 NOTE — Telephone Encounter (Signed)
Pt called

## 2019-05-18 NOTE — Telephone Encounter (Signed)
Pt called she said when she went to the ER she was told she was having a fare-up and was given a steroid shot and prednisone to take at home, still having pain but movement is better, she called pain management they are going to see her on the 28th to address the pain, Asking if you would like to see her? If you do not need to see her pt stated that's ok.

## 2019-05-18 NOTE — Telephone Encounter (Signed)
Left message again. FYI

## 2019-05-27 ENCOUNTER — Ambulatory Visit: Payer: Medicare Other | Admitting: Pain Medicine

## 2019-05-29 ENCOUNTER — Emergency Department (HOSPITAL_COMMUNITY): Payer: Medicare Other

## 2019-05-29 ENCOUNTER — Other Ambulatory Visit: Payer: Self-pay

## 2019-05-29 ENCOUNTER — Encounter (HOSPITAL_COMMUNITY): Payer: Self-pay

## 2019-05-29 ENCOUNTER — Emergency Department (HOSPITAL_COMMUNITY)
Admission: EM | Admit: 2019-05-29 | Discharge: 2019-05-29 | Disposition: A | Discharge status: Home / Self Care | Payer: Medicare Other | Attending: Emergency Medicine | Admitting: Emergency Medicine

## 2019-05-29 DIAGNOSIS — J45909 Unspecified asthma, uncomplicated: Secondary | ICD-10-CM | POA: Diagnosis not present

## 2019-05-29 DIAGNOSIS — Z87891 Personal history of nicotine dependence: Secondary | ICD-10-CM | POA: Diagnosis not present

## 2019-05-29 DIAGNOSIS — Z4889 Encounter for other specified surgical aftercare: Secondary | ICD-10-CM

## 2019-05-29 DIAGNOSIS — I10 Essential (primary) hypertension: Secondary | ICD-10-CM | POA: Diagnosis not present

## 2019-05-29 DIAGNOSIS — R10819 Abdominal tenderness, unspecified site: Secondary | ICD-10-CM | POA: Diagnosis not present

## 2019-05-29 DIAGNOSIS — Z4801 Encounter for change or removal of surgical wound dressing: Secondary | ICD-10-CM | POA: Diagnosis present

## 2019-05-29 DIAGNOSIS — Z79899 Other long term (current) drug therapy: Secondary | ICD-10-CM | POA: Diagnosis not present

## 2019-05-29 LAB — COMPREHENSIVE METABOLIC PANEL
ALT: 22 U/L (ref 0–44)
AST: 17 U/L (ref 15–41)
Albumin: 4.5 g/dL (ref 3.5–5.0)
Alkaline Phosphatase: 81 U/L (ref 38–126)
Anion gap: 12 (ref 5–15)
BUN: 9 mg/dL (ref 6–20)
CO2: 22 mmol/L (ref 22–32)
Calcium: 9 mg/dL (ref 8.9–10.3)
Chloride: 103 mmol/L (ref 98–111)
Creatinine, Ser: 0.72 mg/dL (ref 0.44–1.00)
GFR calc Af Amer: >60 mL/min (ref >60)
GFR calc non Af Amer: >60 mL/min (ref >60)
Glucose, Bld: 105 mg/dL — ABNORMAL HIGH (ref 70–99)
Potassium: 3.3 mmol/L — ABNORMAL LOW (ref 3.5–5.1)
Sodium: 137 mmol/L (ref 135–145)
Total Bilirubin: 1.3 mg/dL — ABNORMAL HIGH (ref 0.3–1.2)
Total Protein: 7.5 g/dL (ref 6.5–8.1)

## 2019-05-29 LAB — CBC WITH DIFFERENTIAL/PLATELET
Abs Immature Granulocytes: 0.03 10*3/uL (ref 0.00–0.07)
Basophils Absolute: 0 10*3/uL (ref 0.0–0.1)
Basophils Relative: 0 %
Eosinophils Absolute: 0 10*3/uL (ref 0.0–0.5)
Eosinophils Relative: 1 %
HCT: 44.9 % (ref 36.0–46.0)
Hemoglobin: 14.8 g/dL (ref 12.0–15.0)
Immature Granulocytes: 1 %
Lymphocytes Relative: 8 %
Lymphs Abs: 0.5 10*3/uL — ABNORMAL LOW (ref 0.7–4.0)
MCH: 28.6 pg (ref 26.0–34.0)
MCHC: 33 g/dL (ref 30.0–36.0)
MCV: 86.8 fL (ref 80.0–100.0)
Monocytes Absolute: 0.4 10*3/uL (ref 0.1–1.0)
Monocytes Relative: 7 %
Neutro Abs: 5.4 10*3/uL (ref 1.7–7.7)
Neutrophils Relative %: 83 %
Platelets: 416 10*3/uL — ABNORMAL HIGH (ref 150–400)
RBC: 5.17 MIL/uL — ABNORMAL HIGH (ref 3.87–5.11)
RDW: 13.9 % (ref 11.5–15.5)
WBC: 6.4 10*3/uL (ref 4.0–10.5)
nRBC: 0 % (ref 0.0–0.2)

## 2019-05-29 LAB — HCG, SERUM, QUALITATIVE: Preg, Serum: NEGATIVE

## 2019-05-29 MED ORDER — POTASSIUM CHLORIDE CRYS ER 20 MEQ PO TBCR: 20.0000 meq | EXTENDED_RELEASE_TABLET | Freq: Every day | ORAL | 0 refills | Status: DC

## 2019-05-29 MED ORDER — IOHEXOL 300 MG/ML  SOLN
100.0000 mL | Freq: Once | INTRAMUSCULAR | Status: AC | PRN
  Administered 2019-05-29: 100 mL via INTRAVENOUS

## 2019-05-29 MED ORDER — POTASSIUM CHLORIDE CRYS ER 20 MEQ PO TBCR
40.0000 meq | EXTENDED_RELEASE_TABLET | Freq: Once | ORAL | Status: AC
  Administered 2019-05-29: 40 meq via ORAL
  Filled 2019-05-29: qty 2

## 2019-05-29 NOTE — ED Triage Notes (Signed)
Pt reports she has yellowish colored drainage from incision from surgery she had in March.  Reports saw surgeon on May 11 for same and was given diflucan.

## 2019-05-29 NOTE — ED Provider Notes (Signed)
[facility] EMERGENCY DEPARTMENT Provider Note   CSN: [id number] Arrival date & time: 05/29/19  [id number]     History Chief Complaint  Patient presents with  . Wound Infection    Emily [name] is a 39 y.o. female with a history of multiple sclerosis, hypertension, anxiety, depression, chronic pain syndrome, & multiple abdominal surgeries with most recent being exploratory laparotomy with I&D and mesh removal from infected hernioplasty 03/19/19 who presents to the ED with complaints of drainage from her surgical site that worsened this AM. Patient states that this morning around 05:30 AM she was attempting to have a bowel movement and as she was bearing down she had a moderate amount of yellow to brown fluid pour from her surgical incision site. This occurred a few more times with bearing down. She states the surgical site is painful, moderate in severity, worse with release of fluids, no alleviating factors. She has had yellow to brown colored drainage on dressings for awhile now, seemed to increase early in May prompting a visit to her surgeon's office 05/18/19- given 1 time dose of 150 mg of Diflucan which seemed to improve the drainage, then seemed to worsen again today. Had not had pouring of drainage like she did this morning previously. Last BM was 0100 this AM. Denies fever, chills, nausea, vomiting, dysuria, hematochezia, melena, diarrhea, or constipation. Her general surgeon who operated 03/19/19 was Dr. Rosendo Gros.    HPI     Past Medical History:  Diagnosis Date  . ADHD   . Anxiety   . Asthma as a child  . Chronic back pain   . Chronic pain   . Conversion disorder   . DDD (degenerative disc disease), cervical   . Depression   . External hemorrhoid   . Fatty liver   . GERD (gastroesophageal reflux disease)   . History of kidney stones   . HTN (hypertension) 02/27/2015  . JC virus antibody positive   . Migraines   . MS (multiple sclerosis) ([place])    06-2006  . Neuromuscular  disorder ([place])    MS  . Neuropathy   . OA (osteoarthritis)   . Ovarian cyst   . Panic attack   . Perforated bowel ([place]) 2009   colostomy bag for 3 motnhs  . 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 PTSD (post-traumatic stress disorder)   . S/P emergency C-section   . Seasonal allergies   . Urinary urgency   . Vertigo   . Wears contact lenses   . Wears glasses     Patient Active Problem List   Diagnosis Date Noted  . Infected hernioplasty mesh ([place]) 03/19/2019  . 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 ADHD 02/22/2019  . Anxiety 02/22/2019  . S/P emergency C-section   . MS (multiple sclerosis) ([place])   . Chronic pain   . Panic attack   . Open wound of abdominal wall, anterior, complicated, sequela [id number]  . Postoperative wound infection 09/20/2018  . Acute postoperative pain 08/25/2018  . History of substance use disorder 07/19/2018  . Nausea without vomiting 04/13/2018  . Nausea with vomiting 03/23/2018  . Pharmacologic therapy 03/16/2018  . Disorder of skeletal system 03/16/2018  . Problems influencing health status 03/16/2018  . Abnormal MRI, cervical spine (03/06/17) 03/16/2018  . Morbid obesity with BMI of 45.0-49.9, adult ([place]) 03/16/2018  . Chronic pain of both knees 02/18/2018  . Cervical facet hypertrophy 04/29/2017  . Cervical facet syndrome (Bilateral) (R>L) 04/29/2017  .  Spondylosis without myelopathy or radiculopathy, cervical region 04/29/2017  . Spondylosis without myelopathy or radiculopathy, lumbosacral region 03/04/2017  . Cervicogenic headache 02/10/2017  . Occipital headache 02/10/2017  . Trigger point with back pain (Left) 02/10/2017  . History of fainting (vasovagal) 02/10/2017  . History of vasovagal syncope 02/10/2017  . Acute left-sided low back pain without sciatica 02/10/2017  . Neurogenic pain 01/23/2017  . Chronic musculoskeletal pain 12/02/2016  . Cervical radiculitis (Bilateral) 11/12/2016  . Chronic upper back  pain (Primary Area of Pain) (Bilateral) (R>L) 11/01/2016  . Chronic hand pain [facility] Area of Pain) (Bilateral) (L>R) 11/01/2016  . Chronic wrist pain [facility] Area of Pain) (Bilateral) (L>R) 11/01/2016  . Chronic foot/toes pain (Fourth Area of Pain) (Bilateral) (R>L) 11/01/2016  . Chronic upper extremity pain (Secondary Area of Pain) (Bilateral) (L>R) 11/01/2016  . Long term prescription benzodiazepine use 11/01/2016  . 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 Cervical foraminal stenosis (Bilateral) (C6-7) 11/01/2016  . 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 DDD (degenerative disc disease), thoracic 11/01/2016  . DDD (degenerative disc disease), lumbar 11/01/2016  . Lumbar facet arthropathy 11/01/2016  . Lumbar facet syndrome (Bilateral) (R>L) 11/01/2016  . Vitamin D deficiency 10/28/2016  . Opiate use 07/23/2016  . Chronic neck pain (Primary Area of Pain) (Bilateral) (R>L) 07/23/2016  . Chronic low back pain (Secondary Area of Pain) (Bilateral) (L>R) 07/23/2016  . Chronic knee pain (Fifth Area of Pain) (Left) 07/23/2016  . Midline thoracic back pain 07/23/2016  . Chronic lower extremity pain (Bilateral) 07/23/2016  . Incarcerated incisional hernia 02/16/2016  . Incisional hernia 02/15/2016  . Carpal tunnel syndrome [facility] Area of Pain) (Bilateral) (L>R) 11/13/2015  . Substance abuse ([place]) 09/07/2015  . Paresthesia 08/02/2015  . Chronic pain syndrome 08/02/2015  . Postpartum endometritis 07/21/2015  . PTSD (post-traumatic stress disorder) 05/16/2015  . Severe episode of recurrent major depressive disorder, without psychotic features ([place]) 05/16/2015  . GAD (generalized anxiety disorder) 05/16/2015  . Difficult intravenous access 05/08/2015  . Right optic neuritis 04/27/2015  . Conversion disorder with attacks or seizures, persistent, with psychological stressor 03/28/2015  . Migraine with aura and without status migrainosus, not intractable  03/28/2015  . Generalized anxiety disorder 03/28/2015  . Conversion disorder 03/28/2015  . HTN (hypertension) 02/27/2015  . Perforated bowel ([place]) 02/27/2015  . Depression 12/27/2014  . Multiple sclerosis ([place]) 12/27/2014  . Migraine 12/27/2014  . Seizure disorder ([place]) 12/27/2014  . Smoker 12/27/2014  . Obesity 12/27/2014  . DDD (degenerative disc disease), cervical 07/28/2014  . Major depressive disorder, single episode 11/02/2013  . Laryngopharyngeal reflux 10/25/2010  . Tonsillitis 10/25/2010    Past Surgical History:  Procedure Laterality Date  . ABDOMINAL SURGERY    . ABDOMINAL WOUND EXPLORATION   03/19/2019   Abdominal Wound Exploration with Explantation of Mesh  . APPENDECTOMY    . BOWEL RESECTION  01/2007   with colostomy  . CESAREAN SECTION N/A 07/12/2015   Procedure: CESAREAN SECTION;  Surgeon: Guss Bunde, MD;  Location: [place];  Service: Obstetrics;  Laterality: N/A;  . COLON SURGERY     part of colon and intestines removed; colostomy 2008  . COLONOSCOPY WITH PROPOFOL N/A 01/29/2017   Procedure: COLONOSCOPY WITH PROPOFOL;  Surgeon: Ileana Roup, MD;  Location: [facility];  Service: General;  Laterality: N/A;  . COLOSTOMY CLOSURE  04/2007  . EVALUATION UNDER ANESTHESIA WITH HEMORRHOIDECTOMY N/A 06/11/2018   Procedure: ANORECTAL EXAM UNDER ANESTHESIA WITH HEMORRHOIDECTOMY;  Surgeon: Dema Severin,  Sharon Mt, MD;  Location: [facility];  Service: General;  Laterality: N/A;  . EXCISIONAL HEMORRHOIDECTOMY    . EXTREMITY CYST EXCISION  1994   right leg  . HERNIA REPAIR    . INCISIONAL HERNIA REPAIR N/A 02/16/2016   Procedure: HERNIA REPAIR INCISIONAL;  Surgeon: Fanny Skates, MD;  Location: [facility];  Service: General;  Laterality: N/A;  . INSERTION OF MESH  02/16/2016   Procedure: INSERTION OF MESH;  Surgeon: Fanny Skates, MD;  Location: [facility];  Service: General;;  . LAPAROTOMY  03/19/2019   Procedure: Abdominal Wound Exploration with  Explantation of Mesh;  Surgeon: Ralene Ok, MD;  Location: [place];  Service: General;;  . RADIOLOGY WITH ANESTHESIA N/A 03/06/2017   Procedure: MRI WITH ANESTHESIA OF CERVICAL SPINE WITHOUT CONTRAST, MRI OF LUMBAR SPINE WITHOUT CONTRAST;  Surgeon: Radiologist, Medication, MD;  Location: [place];  Service: Radiology;  Laterality: N/A;  . RADIOLOGY WITH ANESTHESIA N/A 09/15/2018   Procedure: MRI OF BRAIN WITH AND WITHOUT CONTRAST;  Surgeon: Radiologist, Medication, MD;  Location: [place];  Service: Radiology;  Laterality: N/A;  . RADIOLOGY WITH ANESTHESIA N/A 10/13/2018   Procedure: MRI OF BRAIN WITH AND WITHOUT CONTRAST;  Surgeon: Radiologist, Medication, MD;  Location: [place];  Service: Radiology;  Laterality: N/A;  . RADIOLOGY WITH ANESTHESIA N/A 10/26/2018   Procedure: MRI UNDER ANESTHESIA; HEAD, CERVICAL AND THORASCIC SPINE;  Surgeon: Radiologist, Medication, MD;  Location: [place];  Service: Radiology;  Laterality: N/A;  . SCAR REVISION  01/21/2011   Procedure: SCAR REVISION;  Surgeon: Hermelinda Dellen;  Location: [place];  Service: Plastics;  Laterality: N/A;  exploration of scar of abdomen and repair of defect  . TOOTH EXTRACTION Left 10/2016  . TRANSRECTAL DRAINAGE OF PELVIC ABSCESS       OB History    Gravida  1   Para  1   Term      Preterm  1   AB      Living  1     SAB      TAB      Ectopic      Multiple  0   Live Births  1           Family History  Problem Relation Age of Onset  . Diabetes Mother   . Hypertension Mother   . Diabetes Father   . Hypertension Father   . Arthritis Father   . Cancer Maternal Grandmother   . Cancer Maternal Grandfather   . Alcohol abuse Neg Hx   . Anxiety disorder Neg Hx   . Bipolar disorder Neg Hx   . Drug abuse Neg Hx   . Depression Neg Hx   . Colon cancer Neg Hx     Social History   Tobacco Use  . Smoking status: Former Smoker    Packs/day: 0.25    Years: 10.00    Pack years: 2.50    Types:  Cigarettes    Quit date: 07/12/2015    Years since quitting: 3.8  . Smokeless tobacco: Never Used  Substance Use Topics  . Alcohol use: No    Alcohol/week: 0.0 standard drinks  . Drug use: No    Comment: marinol shows up as THC    Home Medications Prior to Admission medications   Medication Sig Start Date End Date Taking? Authorizing Provider  AIMOVIG 140 MG/ML SOAJ Inject 140 mg into the muscle every 30 (thirty) days. 03/29/19   Ellouise Newer  M, MD  albuterol (PROVENTIL HFA;VENTOLIN HFA) 108 (90 Base) MCG/ACT inhaler Inhale 2 puffs into the lungs every 6 (six) hours as needed for wheezing or shortness of breath. Patient taking differently: Inhale 2 puffs into the lungs every 4 (four) hours as needed for wheezing or shortness of breath.  02/15/15   Jaclyn Prime, Collene Leyden, PA-C  amLODipine (NORVASC) 5 MG tablet Take 5 mg by mouth daily.  01/23/16   [provider]  diclofenac Sodium (VOLTAREN) 1 % GEL Apply 1 application topically 2 (two) times daily as needed (knee and back pain).     [provider]  diphenhydrAMINE (BENADRYL) 25 MG tablet Take 50 mg by mouth at bedtime.    [provider]  divalproex (DEPAKOTE) 500 MG DR tablet Take 1 tablet (500 mg total) by mouth at bedtime. 03/29/19   Cameron Sprang, MD  dronabinol (MARINOL) 2.5 MG capsule Take 1 capsule (2.5 mg total) by mouth 3 (three) times daily. 12/01/18   Annitta Needs, NP  EPINEPHrine (EPIPEN) 0.3 mg/0.3 mL SOAJ injection Inject 0.3 mg into the muscle as needed for anaphylaxis.     [provider]  etonogestrel (NEXPLANON) 68 MG IMPL implant 1 each by Subdermal route once.    [provider]  Fingolimod HCl (GILENYA) 0.5 MG CAPS Take 1 capsule (0.5 mg total) by mouth daily. 03/29/19   Cameron Sprang, MD  loratadine (CLARITIN) 10 MG tablet Take 10 mg by mouth daily as needed for allergies.    [provider]  methocarbamol (ROBAXIN) 750 MG tablet Take 1 tablet (750 mg total) by mouth  2 (two) times daily as needed for muscle spasms. 03/30/19 09/26/19  Milinda Pointer, MD  omeprazole (PRILOSEC) 20 MG capsule TAKE 1 CAPSULE (20 MG TOTAL) BY MOUTH 2 (TWO) TIMES DAILY BEFORE A MEAL. 06/18/18   Annitta Needs, NP  OVER THE COUNTER MEDICATION Take 1-2 tablets by mouth 3 (three) times daily as needed (anxiety or pain). CBD Gummies    [provider]  oxyCODONE (ROXICODONE) 5 MG immediate release tablet Take 1 tablet (5 mg total) by mouth every 6 (six) hours as needed for severe pain. 03/20/19   Norm Parcel, PA-C  pregabalin (LYRICA) 300 MG capsule Take 1 capsule (300 mg total) by mouth at bedtime. 03/30/19 09/26/19  Milinda Pointer, MD  topiramate (TOPAMAX) 100 MG tablet Take 1.5 tablets twice a day 03/29/19   Cameron Sprang, MD    Allergies    Baclofen, Duloxetine hcl, Gabapentin, Monosodium glutamate, Other, Acetaminophen, Alprazolam, Amitriptyline, Magnesium salicylate, Modafinil, Rizatriptan, Tizanidine, Vicodin [hydrocodone-acetaminophen], Adhesive [tape], Hydrocodone-acetaminophen, Ketorolac tromethamine, Lamotrigine, Nsaids, and Tramadol  Review of Systems   Review of Systems  Constitutional: Negative for chills and fever.  Respiratory: Negative for shortness of breath.   Cardiovascular: Negative for chest pain.  Gastrointestinal: Positive for abdominal pain. Negative for anal bleeding, blood in stool, constipation, diarrhea, nausea and vomiting.  Genitourinary: Negative for dysuria.  Skin: Positive for wound.  Neurological: Negative for syncope.  All other systems reviewed and are negative.   Physical Exam Updated Vital Signs BP (!) 143/91 (BP Location: Left Arm)   Pulse (!) 105   Temp 98.4 F (36.9 C) (Oral)   Resp 18   Ht 5\' 3"  (1.6 m)   Wt 117.9 kg   SpO2 100%   BMI 46.06 kg/m   Physical Exam Vitals and nursing note reviewed.  Constitutional:      General: She is not in acute  distress.    Appearance: She is well-developed. She is not  toxic-appearing.  HENT:     Head: Normocephalic and atraumatic.  Eyes:     General:        Right eye: No discharge.        Left eye: No discharge.     Conjunctiva/sclera: Conjunctivae normal.  Cardiovascular:     Rate and Rhythm: Normal rate and regular rhythm.  Pulmonary:     Effort: Pulmonary effort is normal. No respiratory distress.     Breath sounds: Normal breath sounds. No wheezing, rhonchi or rales.  Abdominal:     General: There is no distension.     Palpations: Abdomen is soft.     Tenderness: There is abdominal tenderness (inferior aspect of surgical scar).     Comments: Patient has a healing surgical incision site to the lower/suprapbuic abdomen that is pictured below. There is no significant surrounding erythema/warmth. No active drainage on visualization of palpation in the ED.   Musculoskeletal:     Cervical back: Neck supple.  Skin:    General: Skin is warm and dry.     Findings: No rash.  Neurological:     Mental Status: She is alert.     Comments: Clear speech.   Psychiatric:        Behavior: Behavior normal.       ED Results / Procedures / Treatments   Labs (all labs ordered are listed, but only abnormal results are displayed) Labs Reviewed  CBC WITH DIFFERENTIAL/PLATELET - Abnormal; Notable for the following components:      Result Value   RBC 5.17 (*)    Platelets 416 (*)    Lymphs Abs 0.5 (*)    All other components within normal limits  COMPREHENSIVE METABOLIC PANEL - Abnormal; Notable for the following components:   Potassium 3.3 (*)    Glucose, Bld 105 (*)    Total Bilirubin 1.3 (*)    All other components within normal limits  HCG, SERUM, QUALITATIVE    EKG None  Radiology CT Abdomen Pelvis W Contrast  Result Date: 05/29/2019 CLINICAL DATA:  Abdominal pain,  draining surgical wound EXAM: CT ABDOMEN AND PELVIS WITH CONTRAST TECHNIQUE: Multidetector CT imaging of the abdomen and pelvis was performed using the standard protocol following  bolus administration of intravenous contrast. CONTRAST:  180mL OMNIPAQUE IOHEXOL 300 MG/ML  SOLN COMPARISON:  12/26/2018 FINDINGS: Lower chest: No acute abnormality. Hepatobiliary: Fatty infiltration of the liver is noted. The gallbladder is within normal limits. Pancreas: Unremarkable. No pancreatic ductal dilatation or surrounding inflammatory changes. Spleen: Normal in size without focal abnormality. Adrenals/Urinary Tract: Adrenal glands are within normal limits. Kidneys demonstrate a normal enhancement pattern bilaterally. Scattered small right renal calculi are noted stable from the prior exam. Stable cystic lesion is noted within the right kidney. No obstructive changes are noted. Bladder is decompressed. Stomach/Bowel: Postsurgical changes are noted in the sigmoid colon. The appendix has been surgically removed. No obstructive or inflammatory changes of the colon are seen. Small bowel and stomach appear within normal limits. Vascular/Lymphatic: No significant vascular findings are present. No enlarged abdominal or pelvic lymph nodes. Reproductive: Uterus and bilateral adnexa are unremarkable. Other: Postsurgical changes are noted in the anterior abdominal wall. No focal abscess is seen. Musculoskeletal: Mild degenerative changes of lumbar spine is noted. IMPRESSION: Postsurgical changes without definitive abscess. Stable nonobstructing renal stone on the right. Stable hypodense lesion within the right kidney. MRI on a nonemergent basis is again recommended for  further characterization. Electronically Signed   By: Inez Catalina M.D.   On: 05/29/2019 10:21    Procedures Procedures (including critical care time)  Medications Ordered in ED Medications  potassium chloride SA (KLOR-CON) CR tablet 40 mEq (has no administration in time range)  iohexol (OMNIPAQUE) 300 MG/ML solution 100 mL (100 mLs Intravenous Contrast Given 05/29/19 [id number])    ED Course  I have reviewed the triage vital signs and the  nursing notes.  Pertinent labs & imaging results that were available during my care of the patient were reviewed by me and considered in my medical decision making (see chart for details).    MDM Rules/Calculators/A&P                     Patient presents to the ED with complaints of drainage from surgical incision site s/p I&D & mesh removal from infected hernioplasty. Patient is nontoxic, vitals w/ mildly elevated BP, initial tachycardia resolved on my exam. On ED inspection wound is without erythema/warmth or active drainage, however patient did show me a video on her cell phone with visible active yellow to brown colored drainage pouring from site as she bears down.  Plan for labs & CT A/P for further assessment.   DDX: Fistula, mass, obstruction, abscess, general post op course.    Additional history obtained:  Additional history obtained from patient's video device as well as previous records obtained and reviewed.   Lab Tests:  I Ordered, reviewed, and interpreted labs, which included:  CBC: No significant leukocytosis or anemia. CMP: Mild hypokalemia, will orally replaced. Imaging Studies ordered:  I ordered imaging studies which included CT abdomen/pelvis, I independently visualized and interpreted imaging which showed Postsurgical changes without definitive abscess. Stable nonobstructing renal stone on the right. Stable hypodense lesion within the right kidney. MRI on a nonemergent basis is again recommended for further characterization  ED Course:  10:52: CONSULT: Discussed case with general surgeon Dr. Donne Hazel, relays likely fluid from surgical cavity, given patient is afebrile, without leukocytosis, and is nontoxic she can be discharged with follow-up closely in general surgery clinic.  In agreement with no antibiotics or antifungals at this time.  I discussed results, treatment plan, need for follow-up, and return precautions with the patient. Provided opportunity for  questions, patient confirmed understanding and is in agreement with plan.   Findings and plan of care discussed with supervising physician Dr. Rogene Houston who is in agreement.   Portions of this note were generated with [employer]. Dictation errors may occur despite best attempts at proofreading.   Final Clinical Impression(s) / ED Diagnoses Final diagnoses:  Encounter for post surgical wound check    Rx / DC Orders ED Discharge Orders         Ordered    potassium chloride SA (KLOR-CON) 20 MEQ tablet  Daily     05/29/19 [address], [place], PA-C 05/29/19 1126    Fredia Sorrow, MD 06/29/19 2233

## 2019-05-29 NOTE — ED Notes (Signed)
PA aware of bp for discharge

## 2019-05-29 NOTE — Discharge Instructions (Signed)
You were seen in the ER today due to drainage from your wound.  Your labs and CT scan were overall reassuring.  Your potassium was a bit low therefore we are sending you with a potassium supplement and diet guidelines.  We have prescribed you new medication(s) today. Discuss the medications prescribed today with your pharmacist as they can have adverse effects and interactions with your other medicines including over the counter and prescribed medications. Seek medical evaluation if you start to experience new or abnormal symptoms after taking one of these medicines, seek care immediately if you start to experience difficulty breathing, feeling of your throat closing, facial swelling, or rash as these could be indications of a more serious allergic reaction  We would like you to call Dr. Johney Frame office on Monday morning for close follow-up.  Return to the ER for new or worsening symptoms including but not limited to increased drainage, redness around the surgical site, puslike drainage from a incision, fever, chills, inability to have a bowel movement, inability to keep fluids down, or any other concerns.

## 2019-05-31 NOTE — Progress Notes (Signed)
Patient: Emily [name]  Service Category: E/M  Provider: Gaspar Cola, MD  DOB: 21-Jul-1980  DOS: 06/02/2019  Location: Office  MRN: [id number]  Setting: Ambulatory outpatient  Referring Provider: Monico Blitz, MD  Type: Established Patient  Specialty: Interventional Pain Management  PCP: Leeanne Rio, MD  Location: Remote location  Delivery: TeleHealth     Virtual Encounter - Pain Management PROVIDER NOTE: Information contained herein reflects review and annotations entered in association with encounter. Interpretation of such information and data should be left to medically-trained personnel. Information provided to patient can be located elsewhere in the medical record under "Patient Instructions". Document created using STT-dictation technology, any transcriptional errors that may result from process are unintentional.    Contact & Pharmacy Preferred: [phone] Home: [phone] (home) Mobile: [phone] (mobile) E-mail: [email or web address]  [id number] [place]Phone: [phone]: [place]- [address] [place]Phone: [phone]: [phone]  Pre-screening  Emily [name] offered "in-person" vs "virtual" encounter. She indicated preferring virtual for this encounter.   Reason COVID-19*  Social distancing based on CDC and AMA recommendations.   I contacted Emily [name] on 06/02/2019 via telephone.      I clearly identified myself as FGaspar Cola MD. I verified that I was speaking with the correct person using two identifiers (Name: Emily [name], and date of birth: [birth year].  Consent I sought verbal advanced consent from CWalkerfor virtual visit interactions. I informed Emily [name] of possible security and privacy concerns, risks, and limitations  associated with providing "not-in-person" medical evaluation and management services. I also informed Emily [name] of the availability of "in-person" appointments. Finally, I informed her that there would be a charge for the virtual visit and that she could be  personally, fully or partially, financially responsible for it. Ms. MStierwaltexpressed understanding and agreed to proceed.   Historic Elements   Emily [name] is a 39y.o. year old, female patient evaluated today after her last contact with our practice on 03/16/2019. Ms. MFinder has a past medical history of ADHD, Anxiety, Asthma (as a child), Chronic back pain, Chronic pain, Conversion disorder, DDD (degenerative disc disease), cervical, Depression, External hemorrhoid, Fatty liver, GERD (gastroesophageal reflux disease), History of kidney stones, HTN (hypertension) (02/27/2015), JC virus antibody positive, Migraines, MS (multiple sclerosis) ([place], Neuromuscular disorder ([place], Neuropathy, OA (osteoarthritis), Ovarian cyst, Panic attack, Perforated bowel ([place] (2009), Pseudoseizures, PTSD (post-traumatic stress disorder), S/P emergency C-section, Seasonal allergies, Urinary urgency, Vertigo, Wears contact lenses, and Wears glasses. She also  has a past surgical history that includes Extremity cyst excision (1994); Bowel resection (01/2007); Colostomy closure (04/2007); Scar revision (01/21/2011); Hernia repair; Abdominal surgery; Appendectomy; Cesarean section (N/A, 07/12/2015); Incisional hernia repair (N/A, 02/16/2016); Insertion of mesh (02/16/2016); Tooth Extraction (Left, 10/2016); Colonoscopy with propofol (N/A, 01/29/2017); Radiology with anesthesia (N/A, 03/06/2017); Exam under anesthesia with hemorrhoidectomy (N/A, 06/11/2018); Excisional hemorrhoidectomy; Transrectal drainage of pelvic abscess; Radiology with anesthesia (N/A, 09/15/2018); Radiology with anesthesia (N/A, 10/13/2018); Radiology with anesthesia (N/A, 10/26/2018); Colon surgery; ABDOMINAL WOUND  EXPLORATION  (03/19/2019); and laparotomy (03/19/2019). Ms. MAbbruzzesehas a current medication list which includes the following prescription(s): aimovig, albuterol, amlodipine, diclofenac sodium, diphenhydramine, divalproex, dronabinol, epinephrine, etonogestrel, gilenya, loratadine, methocarbamol, omeprazole, OVER THE COUNTER MEDICATION, potassium chloride sa, pregabalin, and topiramate, and the following  Facility-Administered Medications: metoclopramide. She  reports that she quit smoking about 3 years ago. Her smoking use included cigarettes. She has a 2.50 pack-year smoking history. She has never used smokeless tobacco. She reports that she does not drink alcohol or use drugs. Emily [name] is allergic to baclofen; duloxetine hcl; gabapentin; monosodium glutamate; other; acetaminophen; alprazolam; amitriptyline; magnesium salicylate; modafinil; rizatriptan; tizanidine; vicodin [hydrocodone-acetaminophen]; adhesive [tape]; hydrocodone-acetaminophen; ketorolac tromethamine; lamotrigine; nsaids; and tramadol.   HPI  Today, she is being contacted for medication management.  Patient tested positive for coronavirus in January 2021.  The patient indicates that 2 months after her follow-up abdominal surgery, she still weeping and she is pending to see her surgery again to see if something can be done about this.  In any case, today she wanted to talk to me about her hand and feet pain, secondary to her neuropathy.  She is currently taking pregabalin (Lyrica) at 300 mg at bedtime.  She denies any type of problems with it and therefore today we will go ahead and switch her Lyrica to the 225 mg pill, which we will then instruct the patient to take twice daily.  Therefore, we will go from 300 mg/day to 450 mg/day.  Hopefully this will help her with her neurogenic pain.  Pharmacotherapy Assessment  Analgesic: None from our practice due to benzodiazepine use and high risk. The patient is also taking Marinol. The patient last  obtained oxycodone from [place], PA-[facility]).  (On 04/06/2019)  Monitoring: [place] PMP: PDMP reviewed during this encounter.       Pharmacotherapy: No side-effects or adverse reactions reported. Compliance: No problems identified. Effectiveness: Clinically acceptable. Plan: Refer to "POC".  UDS:  Summary  Date Value Ref Range Status  03/16/2018 FINAL  Final    Comment:    ==================================================================== TOXASSURE COMP DRUG ANALYSIS,UR ==================================================================== Test                             Result       Flag       Units Drug Present and Declared for Prescription Verification   Desmethyldiazepam              214          EXPECTED   ng/mg creat   Oxazepam                       1241         EXPECTED   ng/mg creat   Temazepam                      1052         EXPECTED   ng/mg creat    Desmethyldiazepam, oxazepam, and temazepam are expected    metabolites of diazepam. Desmethyldiazepam and oxazepam are also    expected metabolites of other drugs, including chlordiazepoxide,    prazepam, clorazepate, and halazepam. Oxazepam is an expected    metabolite of temazepam. Oxazepam and temazepam are also    available as scheduled prescription medications.   Pregabalin                     PRESENT      EXPECTED   Topiramate                     PRESENT      EXPECTED   Methocarbamol  PRESENT      EXPECTED   Fluoxetine                     PRESENT      EXPECTED   Norfluoxetine                  PRESENT      EXPECTED    Norfluoxetine is an expected metabolite of fluoxetine. Drug Absent but Declared for Prescription Verification   Prochlorperazine               Not Detected UNEXPECTED   Ibuprofen                      Not Detected UNEXPECTED    Ibuprofen, as indicated in the declared medication list, is not    always detected even when used as  directed. ==================================================================== Test                      Result    Flag   Units      Ref Range   Creatinine              29               mg/dL      >=20 ==================================================================== Declared Medications:  The flagging and interpretation on this report are based on the  following declared medications.  Unexpected results may arise from  inaccuracies in the declared medications.  **Note: The testing scope of this panel includes these medications:  Diazepam (Valium)  Fluoxetine (Prozac)  Methocarbamol (Robaxin)  Pregabalin (Lyrica)  Prochlorperazine (Compazine)  Topiramate (Topamax)  **Note: The testing scope of this panel does not include small to  moderate amounts of these reported medications:  Ibuprofen  **Note: The testing scope of this panel does not include following  reported medications:  Albuterol  Amlodipine (Norvasc)  Buspirone (BuSpar)  Divalproex (Depakote)  Epinephrine (EpiPen)  Etonogestrel (Nexplanon)  Fingolimod (Gilenya)  Lithium (Lithobid)  Loratadine (Claritin)  Multivitamin  Ranitidine (Zantac) ==================================================================== For clinical consultation, please call [phone]. ====================================================================    Laboratory Chemistry Profile   Renal Lab Results  Component Value Date   BUN 9 05/29/2019   CREATININE 0.72 05/29/2019   LABCREA 292.00 07/12/2015   BCR 12 08/13/2018   GFRAA >60 05/29/2019   GFRNONAA >60 05/29/2019     Hepatic Lab Results  Component Value Date   AST 17 05/29/2019   ALT 22 05/29/2019   ALBUMIN 4.5 05/29/2019   ALKPHOS 81 05/29/2019   LIPASE 41 07/12/2015     Electrolytes Lab Results  Component Value Date   NA 137 05/29/2019   K 3.3 (L) 05/29/2019   CL 103 05/29/2019   CALCIUM 9.0 05/29/2019   MG 2.2 03/16/2018     Bone Lab Results  Component  Value Date   25OHVITD1 20 (L) 03/16/2018   25OHVITD2 4.7 03/16/2018   25OHVITD3 15 03/16/2018     Inflammation (CRP: Acute Phase) (ESR: Chronic Phase) Lab Results  Component Value Date   CRP 6 03/16/2018   ESRSEDRATE 20 03/16/2018   LATICACIDVEN 1.0 11/12/2018       Note: Above Lab results reviewed.  Imaging  CT Abdomen Pelvis W Contrast CLINICAL DATA:  Abdominal pain,  draining surgical wound  EXAM: CT ABDOMEN AND PELVIS WITH CONTRAST  TECHNIQUE: Multidetector CT imaging of the abdomen and pelvis was performed using the standard protocol following bolus administration of intravenous  contrast.  CONTRAST:  111m OMNIPAQUE IOHEXOL 300 MG/ML  SOLN  COMPARISON:  12/26/2018  FINDINGS: Lower chest: No acute abnormality.  Hepatobiliary: Fatty infiltration of the liver is noted. The gallbladder is within normal limits.  Pancreas: Unremarkable. No pancreatic ductal dilatation or surrounding inflammatory changes.  Spleen: Normal in size without focal abnormality.  Adrenals/Urinary Tract: Adrenal glands are within normal limits. Kidneys demonstrate a normal enhancement pattern bilaterally. Scattered small right renal calculi are noted stable from the prior exam. Stable cystic lesion is noted within the right kidney. No obstructive changes are noted. Bladder is decompressed.  Stomach/Bowel: Postsurgical changes are noted in the sigmoid colon. The appendix has been surgically removed. No obstructive or inflammatory changes of the colon are seen. Small bowel and stomach appear within normal limits.  Vascular/Lymphatic: No significant vascular findings are present. No enlarged abdominal or pelvic lymph nodes.  Reproductive: Uterus and bilateral adnexa are unremarkable.  Other: Postsurgical changes are noted in the anterior abdominal wall. No focal abscess is seen.  Musculoskeletal: Mild degenerative changes of lumbar spine is noted.  IMPRESSION: Postsurgical changes  without definitive abscess.  Stable nonobstructing renal stone on the right.  Stable hypodense lesion within the right kidney. MRI on a nonemergent basis is again recommended for further characterization.  Electronically Signed   By: MInez CatalinaM.D.   On: 05/29/2019 10:21  Assessment  The primary encounter diagnosis was Chronic pain syndrome. Diagnoses of Multiple sclerosis ([place], Chronic CNS demyelinating disease (MS), Chronic neck pain (Primary Area of Pain) (Bilateral) (R>L), Chronic low back pain (Secondary Area of Pain) (Bilateral) (L>R), Chronic hand pain (Tertiary Area of Pain) (Bilateral) (L>R), Chronic foot/toes pain (Fourth Area of Pain) (Bilateral) (R>L), Chronic knee pain (Fifth Area of Pain) (Left), Pharmacologic therapy, and Neurogenic pain were also pertinent to this visit.  Plan of Care  Problem-specific:  No problem-specific Assessment & Plan notes found for this encounter.  Emily [name] has a current medication list which includes the following long-term medication(s): albuterol, divalproex, etonogestrel, gilenya, methocarbamol, omeprazole, potassium chloride sa, pregabalin, and topiramate.  Pharmacotherapy (Medications Ordered): Meds ordered this encounter  Medications  . pregabalin (LYRICA) 225 MG capsule    Sig: Take 1 capsule (225 mg total) by mouth 2 (two) times daily.    Dispense:  60 capsule    Refill:  5    Fill one day early if pharmacy is closed on scheduled refill date. May substitute for generic, or similar, if available.   Orders:  No orders of the defined types were placed in this encounter.  Follow-up plan:   Return in 16 weeks (on 09/22/2019) for (F2F), (MM) to evaluate increase in Lyrica.      Interventional management options: Considering:   NOTE: Hold until complete closure and resolution of abdominal wound infection.  Diagnosticmidline thoracic ESI Diagnosticbilateral thoracic facet block  Possiblebilateral thoracic  RFA Diagnostic right sided LESI Therapeutic bilateral lumbar facet RFA #1  Diagnosticleft IA knee injection Diagnosticleft knee genicular NB Possibleleft genicular nerve RFA  Diagnosticright-sided CESI Diagnosticright-sided cervical facet block Diagnosticright-sided cervical facet RFA Diagnosticright occipital NB   Palliative PRN treatment(s):   NOTE: No procedures will be scheduled until the patient fully heals from her infected abdominal wound. Palliative bilateral lumbar facet block #3      Recent Visits Date Type Provider Dept  03/30/19 Telemedicine NMilinda Pointer MD [facility]  Showing recent visits within past 90 days and meeting all other requirements   Today's Visits Date Type Provider  Dept  06/02/19 Telemedicine Milinda Pointer, MD [facility]  Showing today's visits and meeting all other requirements   Future Appointments No visits were found meeting these conditions.  Showing future appointments within next 90 days and meeting all other requirements   I discussed the assessment and treatment plan with the patient. The patient was provided an opportunity to ask questions and all were answered. The patient agreed with the plan and demonstrated an understanding of the instructions.  Patient advised to call back or seek an in-person evaluation if the symptoms or condition worsens.  Duration of encounter: 15 minutes.  Note by: Gaspar Cola, MD Date: 06/02/2019; Time: 11:08 AM

## 2019-06-01 ENCOUNTER — Telehealth: Payer: Self-pay

## 2019-06-01 ENCOUNTER — Other Ambulatory Visit: Payer: Self-pay

## 2019-06-01 ENCOUNTER — Encounter: Payer: Self-pay | Admitting: Gastroenterology

## 2019-06-01 ENCOUNTER — Encounter: Payer: Self-pay | Admitting: Internal Medicine

## 2019-06-01 ENCOUNTER — Telehealth (INDEPENDENT_AMBULATORY_CARE_PROVIDER_SITE_OTHER): Payer: Medicare Other | Admitting: Gastroenterology

## 2019-06-01 DIAGNOSIS — R110 Nausea: Secondary | ICD-10-CM

## 2019-06-01 MED ORDER — DRONABINOL 2.5 MG PO CAPS: 2.5000 mg | ORAL_CAPSULE | Freq: Three times a day (TID) | ORAL | 5 refills | Status: DC

## 2019-06-01 NOTE — Telephone Encounter (Signed)
Noted. RX faxed.

## 2019-06-01 NOTE — Progress Notes (Signed)
Primary Care Physician:  Leeanne Rio, MD  Primary GI: Dr. Gala Romney   Patient Location: Home   Provider Location: [facility] office   Reason for Visit: Follow-up    Persons present on the virtual encounter, with roles: Patient and NP     Due to COVID-19, visit was conducted using virtual method.  Visit was requested by patient.  Virtual Visit via MyChart Video Note Due to COVID-19, visit is conducted virtually and was requested by patient.   I connected with Marjani L Kepner on 06/01/19 at 10:30 AM EDT by telephone and verified that I am speaking with the correct person using two identifiers.   I discussed the limitations, risks, security and privacy concerns of performing an evaluation and management service by telephone and the availability of in person appointments. I also discussed with the patient that there may be a patient responsible charge related to this service. The patient expressed understanding and agreed to proceed.  Chief Complaint  Patient presents with  . Nausea    but no vomiting. able to eat fine for now.     History of Present Illness: 39 year old [name] with history of chronic nausea, present since MS diagnosis. Remote history of EGD at [facility] 15 years ago. Previously on Zofran, Reglan, Compazine, Phenergan. Marinol has helped in the past. GES not on file, as she was dealing with multiple health issues and has been unable to complete.   Doing well with Marinol BID. Able to eat without difficulties. Not losing weight. Maintaining weight. Feels improved from last visit. Had surgery March 19, 2019. Mesh removal. Over last few weeks had drainage like a "waterall" from incision. Looks like yellow/egg-white consistency. 20 ml on Sunday came out. Nothing yesterday. Today over 10 ml of fluid came out. Sees the surgeon on Thursday.     Past Medical History:  Diagnosis Date  . ADHD   . Anxiety   . Asthma as a child  . Chronic back pain   . Chronic pain   .  Conversion disorder   . DDD (degenerative disc disease), cervical   . Depression   . External hemorrhoid   . Fatty liver   . GERD (gastroesophageal reflux disease)   . History of kidney stones   . HTN (hypertension) 02/27/2015  . JC virus antibody positive   . Migraines   . MS (multiple sclerosis) ([place])    06-2006  . Neuromuscular disorder ([place])    MS  . Neuropathy   . OA (osteoarthritis)   . Ovarian cyst   . Panic attack   . Perforated bowel ([place]) 2009   colostomy bag for 3 motnhs  . 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 PTSD (post-traumatic stress disorder)   . S/P emergency C-section   . Seasonal allergies   . Urinary urgency   . Vertigo   . Wears contact lenses   . Wears glasses      Past Surgical History:  Procedure Laterality Date  . ABDOMINAL SURGERY    . ABDOMINAL WOUND EXPLORATION   03/19/2019   Abdominal Wound Exploration with Explantation of Mesh  . APPENDECTOMY    . BOWEL RESECTION  01/2007   with colostomy  . CESAREAN SECTION N/A 07/12/2015   Procedure: CESAREAN SECTION;  Surgeon: Guss Bunde, MD;  Location: [place];  Service: Obstetrics;  Laterality: N/A;  . COLON SURGERY     part of colon and intestines removed; colostomy  2008  . COLONOSCOPY WITH PROPOFOL N/A 01/29/2017   Procedure: COLONOSCOPY WITH PROPOFOL;  Surgeon: Ileana Roup, MD;  Location: [facility];  Service: General;  Laterality: N/A;  . COLOSTOMY CLOSURE  04/2007  . EVALUATION UNDER ANESTHESIA WITH HEMORRHOIDECTOMY N/A 06/11/2018   Procedure: ANORECTAL EXAM UNDER ANESTHESIA WITH HEMORRHOIDECTOMY;  Surgeon: Ileana Roup, MD;  Location: [facility];  Service: General;  Laterality: N/A;  . EXCISIONAL HEMORRHOIDECTOMY    . EXTREMITY CYST EXCISION  1994   right leg  . HERNIA REPAIR    . INCISIONAL HERNIA REPAIR N/A 02/16/2016   Procedure: HERNIA REPAIR INCISIONAL;  Surgeon: Fanny Skates, MD;  Location: [facility];  Service:  General;  Laterality: N/A;  . INSERTION OF MESH  02/16/2016   Procedure: INSERTION OF MESH;  Surgeon: Fanny Skates, MD;  Location: [facility];  Service: General;;  . LAPAROTOMY  03/19/2019   Procedure: Abdominal Wound Exploration with Explantation of Mesh;  Surgeon: Ralene Ok, MD;  Location: [place];  Service: General;;  . RADIOLOGY WITH ANESTHESIA N/A 2/Emily/2019   Procedure: MRI WITH ANESTHESIA OF CERVICAL SPINE WITHOUT CONTRAST, MRI OF LUMBAR SPINE WITHOUT CONTRAST;  Surgeon: Radiologist, Medication, MD;  Location: [place];  Service: Radiology;  Laterality: N/A;  . RADIOLOGY WITH ANESTHESIA N/A 09/15/2018   Procedure: MRI OF BRAIN WITH AND WITHOUT CONTRAST;  Surgeon: Radiologist, Medication, MD;  Location: [place];  Service: Radiology;  Laterality: N/A;  . RADIOLOGY WITH ANESTHESIA N/A 10/13/2018   Procedure: MRI OF BRAIN WITH AND WITHOUT CONTRAST;  Surgeon: Radiologist, Medication, MD;  Location: [place];  Service: Radiology;  Laterality: N/A;  . RADIOLOGY WITH ANESTHESIA N/A 10/26/2018   Procedure: MRI UNDER ANESTHESIA; HEAD, CERVICAL AND THORASCIC SPINE;  Surgeon: Radiologist, Medication, MD;  Location: [place];  Service: Radiology;  Laterality: N/A;  . SCAR REVISION  01/21/2011   Procedure: SCAR REVISION;  Surgeon: Hermelinda Dellen;  Location: [place];  Service: Plastics;  Laterality: N/A;  exploration of scar of abdomen and repair of defect  . TOOTH EXTRACTION Left 10/2016  . TRANSRECTAL DRAINAGE OF PELVIC ABSCESS       Current Meds  Medication Sig  . AIMOVIG 140 MG/ML SOAJ Inject 140 mg into the muscle every 30 (thirty) days.  Marland Kitchen albuterol (PROVENTIL HFA;VENTOLIN HFA) 108 (90 Base) MCG/ACT inhaler Inhale 2 puffs into the lungs every 6 (six) hours as needed for wheezing or shortness of breath. (Patient taking differently: Inhale 2 puffs into the lungs every 4 (four) hours as needed for wheezing or shortness of breath. )  . amLODipine (NORVASC) 5 MG tablet Take 5 mg by mouth  daily.   . diclofenac Sodium (VOLTAREN) 1 % GEL Apply 1 application topically 2 (two) times daily as needed (knee and back pain).   Marland Kitchen diphenhydrAMINE (BENADRYL) 25 MG tablet Take 50 mg by mouth at bedtime.  . divalproex (DEPAKOTE) 500 MG DR tablet Take 1 tablet (500 mg total) by mouth at bedtime.  . dronabinol (MARINOL) 2.5 MG capsule Take 1 capsule (2.5 mg total) by mouth 3 (three) times daily.  Marland Kitchen EPINEPHrine (EPIPEN) 0.3 mg/0.3 mL SOAJ injection Inject 0.3 mg into the muscle as needed for anaphylaxis.   Marland Kitchen etonogestrel (NEXPLANON) 68 MG IMPL implant 1 each by Subdermal route once.  . Fingolimod HCl (GILENYA) 0.5 MG CAPS Take 1 capsule (0.5 mg total) by mouth daily.  . fluconazole (DIFLUCAN) 150 MG tablet Take 150 mg by mouth once.  . loratadine (CLARITIN) 10 MG  tablet Take 10 mg by mouth daily as needed for allergies.  . methocarbamol (ROBAXIN) 750 MG tablet Take 1 tablet (750 mg total) by mouth 2 (two) times daily as needed for muscle spasms.  Marland Kitchen omeprazole (PRILOSEC) 20 MG capsule TAKE 1 CAPSULE (20 MG TOTAL) BY MOUTH 2 (TWO) TIMES DAILY BEFORE A MEAL.  Marland Kitchen OVER THE COUNTER MEDICATION Take 1-2 tablets by mouth 3 (three) times daily as needed (anxiety or pain). CBD Gummies  . potassium chloride SA (KLOR-CON) 20 MEQ tablet Take 1 tablet (20 mEq total) by mouth daily.  . pregabalin (LYRICA) 300 MG capsule Take 1 capsule (300 mg total) by mouth at bedtime.  . topiramate (TOPAMAX) 100 MG tablet Take 1.5 tablets twice a day     Family History  Problem Relation Age of Onset  . Diabetes Mother   . Hypertension Mother   . Diabetes Father   . Hypertension Father   . Arthritis Father   . Cancer Maternal Grandmother   . Cancer Maternal Grandfather   . Alcohol abuse Neg Hx   . Anxiety disorder Neg Hx   . Bipolar disorder Neg Hx   . Drug abuse Neg Hx   . Depression Neg Hx   . Colon cancer Neg Hx     Social History   Socioeconomic History  . Marital status: Single    Spouse name: Not on file    . Number of children: 1  . Years of education: College  . Highest education level: Not on file  Occupational History    Employer: OTHER    Comment: disability  Tobacco Use  . Smoking status: Former Smoker    Packs/day: 0.25    Years: 10.00    Pack years: 2.50    Types: Cigarettes    Quit date: 07/12/2015    Years since quitting: 3.8  . Smokeless tobacco: Never Used  Substance and Sexual Activity  . Alcohol use: No    Alcohol/week: 0.0 standard drinks  . Drug use: No    Comment: marinol shows up as THC  . Sexual activity: Not Currently    Partners: Male    Birth control/protection: None, Implant  Other Topics Concern  . Not on file  Social History Narrative    Born and raised in [place], [place] by parents. Pt has one younger sister. Pt has a Best boy. Pt worked from 1999-2006. She stopped due to MS and is currently on disability.        Married for less than 1 yr in 1999 that ended in divorce.      Caffeine Use: 1 20oz soda daily      Lives with mom and daughter   Social Determinants of Health   Financial Resource Strain:   . Difficulty of Paying Living Expenses:   Food Insecurity:   . Worried About Charity fundraiser in the Last Year:   . Arboriculturist in the Last Year:   Transportation Needs:   . Film/video editor (Medical):   Marland Kitchen Lack of Transportation (Non-Medical):   Physical Activity:   . Days of Exercise per Week:   . Minutes of Exercise per Session:   Stress:   . Feeling of Stress :   Social Connections:   . Frequency of Communication with Friends and Family:   . Frequency of Social Gatherings with Friends and Family:   . Attends Religious Services:   . Active Member of Clubs or Organizations:   .  Attends Archivist Meetings:   Marland Kitchen Marital Status:        Review of Systems: Gen: Denies fever, chills, anorexia. Denies fatigue, weakness, weight loss.  CV: Denies chest pain, palpitations, syncope, peripheral edema, and  claudication. Resp: Denies dyspnea at rest, cough, wheezing, coughing up blood, and pleurisy. GI: see HPI Derm: Denies rash, itching, dry skin Psych: Denies depression, anxiety, memory loss, confusion. No homicidal or suicidal ideation.  Heme: Denies bruising, bleeding, and enlarged lymph nodes.  Observations/Objective: No distress. Alert and cooperative.   Assessment and Plan: 39 year old [name] with chronic nausea, failing multiple agents but doing well with PPI BID and Marinol BID. We have yet to complete GES due to issues with COVID, but she is able to pursue this now. No alarm signs/symptoms.  Pursue GES in near future. Return in 6 months. Keep upcoming appt with surgeon due to wound drainage. CT in ED without abscess or fistula.   Follow Up Instructions: See AVS    I discussed the assessment and treatment plan with the patient. The patient was provided an opportunity to ask questions and all were answered. The patient agreed with the plan and demonstrated an understanding of the instructions.   The patient was advised to call back or seek an in-person evaluation if the symptoms worsen or if the condition fails to improve as anticipated.    Annitta Needs, PhD, ANP-BC [facility]

## 2019-06-01 NOTE — Telephone Encounter (Signed)
Had called pt to inform her of GES appt. She had video visit w/AB this morning and wanted AB to know she does need medication refill.

## 2019-06-01 NOTE — Telephone Encounter (Signed)
Completed and printed. Elmo Putt, can we fax this to the pharmacy?

## 2019-06-01 NOTE — Progress Notes (Signed)
Cc'ed to pcp °

## 2019-06-01 NOTE — Patient Instructions (Signed)
Continue Marinol twice a day and omeprazole twice a day.  We are arranging a gastric emptying study in near future.  We will see you in 6 months!  I enjoyed seeing you again today! As you know, I value our relationship and want to provide genuine, compassionate, and quality care. I welcome your feedback. If you receive a survey regarding your visit,  I greatly appreciate you taking time to fill this out. See you next time!  Annitta Needs, PhD, ANP-BC [facility]

## 2019-06-01 NOTE — Telephone Encounter (Signed)
GES scheduled for 06/11/19 at 8:00am, arrive at 7:45am. NPO after midnight before test and no stomach meds the morning of test.  Called and informed pt of GES appt. Letter mailed.

## 2019-06-02 ENCOUNTER — Other Ambulatory Visit: Payer: Self-pay

## 2019-06-02 ENCOUNTER — Ambulatory Visit: Payer: Medicare Other | Attending: Pain Medicine | Admitting: Pain Medicine

## 2019-06-02 ENCOUNTER — Telehealth: Payer: Self-pay | Admitting: *Deleted

## 2019-06-02 DIAGNOSIS — M79642 Pain in left hand: Secondary | ICD-10-CM

## 2019-06-02 DIAGNOSIS — G35 Multiple sclerosis: Secondary | ICD-10-CM | POA: Diagnosis not present

## 2019-06-02 DIAGNOSIS — G8929 Other chronic pain: Secondary | ICD-10-CM

## 2019-06-02 DIAGNOSIS — M25562 Pain in left knee: Secondary | ICD-10-CM

## 2019-06-02 DIAGNOSIS — M79675 Pain in left toe(s): Secondary | ICD-10-CM

## 2019-06-02 DIAGNOSIS — Z79899 Other long term (current) drug therapy: Secondary | ICD-10-CM

## 2019-06-02 DIAGNOSIS — M542 Cervicalgia: Secondary | ICD-10-CM | POA: Diagnosis not present

## 2019-06-02 DIAGNOSIS — M79641 Pain in right hand: Secondary | ICD-10-CM

## 2019-06-02 DIAGNOSIS — G35D Multiple sclerosis, unspecified: Secondary | ICD-10-CM

## 2019-06-02 DIAGNOSIS — G379 Demyelinating disease of central nervous system, unspecified: Secondary | ICD-10-CM

## 2019-06-02 DIAGNOSIS — G894 Chronic pain syndrome: Secondary | ICD-10-CM | POA: Diagnosis not present

## 2019-06-02 DIAGNOSIS — M5441 Lumbago with sciatica, right side: Secondary | ICD-10-CM

## 2019-06-02 DIAGNOSIS — M5442 Lumbago with sciatica, left side: Secondary | ICD-10-CM

## 2019-06-02 DIAGNOSIS — M792 Neuralgia and neuritis, unspecified: Secondary | ICD-10-CM

## 2019-06-02 DIAGNOSIS — M79674 Pain in right toe(s): Secondary | ICD-10-CM

## 2019-06-02 MED ORDER — PREGABALIN 225 MG PO CAPS: 225.0000 mg | ORAL_CAPSULE | Freq: Two times a day (BID) | ORAL | 5 refills | Status: DC

## 2019-06-02 NOTE — Telephone Encounter (Signed)
Emily [name], you are scheduled for a virtual visit with your provider today.  Just as we do with appointments in the office, we must obtain your consent to participate.  Your consent will be active for this visit and any virtual visit you may have with one of our providers in the next 365 days.  If you have a MyChart account, I can also send a copy of this consent to you electronically.  All virtual visits are billed to your insurance company just like a traditional visit in the office.  As this is a virtual visit, video technology does not allow for your provider to perform a traditional examination.  This may limit your provider's ability to fully assess your condition.  If your provider identifies any concerns that need to be evaluated in person or the need to arrange testing such as labs, EKG, etc, we will make arrangements to do so.  Although advances in technology are sophisticated, we cannot ensure that it will always work on either your end or our end.  If the connection with a video visit is poor, we may have to switch to a telephone visit.  With either a video or telephone visit, we are not always able to ensure that we have a secure connection.   I need to obtain your verbal consent now.   Are you willing to proceed with your visit today?

## 2019-06-02 NOTE — Telephone Encounter (Signed)
Pt consented to virtual/telephone visit on 06/01/19.

## 2019-06-07 ENCOUNTER — Encounter (HOSPITAL_COMMUNITY): Payer: Self-pay | Admitting: *Deleted

## 2019-06-07 ENCOUNTER — Emergency Department (HOSPITAL_COMMUNITY): Payer: Medicare Other

## 2019-06-07 ENCOUNTER — Other Ambulatory Visit: Payer: Self-pay

## 2019-06-07 ENCOUNTER — Emergency Department (HOSPITAL_COMMUNITY)
Admission: EM | Admit: 2019-06-07 | Discharge: 2019-06-07 | Disposition: A | Discharge status: Home / Self Care | Payer: Medicare Other | Attending: Emergency Medicine | Admitting: Emergency Medicine

## 2019-06-07 DIAGNOSIS — G8929 Other chronic pain: Secondary | ICD-10-CM

## 2019-06-07 DIAGNOSIS — Z87891 Personal history of nicotine dependence: Secondary | ICD-10-CM | POA: Diagnosis not present

## 2019-06-07 DIAGNOSIS — Z79899 Other long term (current) drug therapy: Secondary | ICD-10-CM | POA: Diagnosis not present

## 2019-06-07 DIAGNOSIS — I10 Essential (primary) hypertension: Secondary | ICD-10-CM | POA: Insufficient documentation

## 2019-06-07 DIAGNOSIS — R109 Unspecified abdominal pain: Secondary | ICD-10-CM | POA: Insufficient documentation

## 2019-06-07 DIAGNOSIS — G8918 Other acute postprocedural pain: Secondary | ICD-10-CM | POA: Insufficient documentation

## 2019-06-07 DIAGNOSIS — F909 Attention-deficit hyperactivity disorder, unspecified type: Secondary | ICD-10-CM | POA: Insufficient documentation

## 2019-06-07 LAB — CBC
HCT: 42.3 % (ref 36.0–46.0)
Hemoglobin: 13.5 g/dL (ref 12.0–15.0)
MCH: 28 pg (ref 26.0–34.0)
MCHC: 31.9 g/dL (ref 30.0–36.0)
MCV: 87.8 fL (ref 80.0–100.0)
Platelets: 398 10*3/uL (ref 150–400)
RBC: 4.82 MIL/uL (ref 3.87–5.11)
RDW: 14 % (ref 11.5–15.5)
WBC: 7 10*3/uL (ref 4.0–10.5)
nRBC: 0 % (ref 0.0–0.2)

## 2019-06-07 LAB — COMPREHENSIVE METABOLIC PANEL
ALT: 20 U/L (ref 0–44)
AST: 18 U/L (ref 15–41)
Albumin: 3.8 g/dL (ref 3.5–5.0)
Alkaline Phosphatase: 82 U/L (ref 38–126)
Anion gap: 8 (ref 5–15)
BUN: 11 mg/dL (ref 6–20)
CO2: 28 mmol/L (ref 22–32)
Calcium: 8.7 mg/dL — ABNORMAL LOW (ref 8.9–10.3)
Chloride: 105 mmol/L (ref 98–111)
Creatinine, Ser: 0.63 mg/dL (ref 0.44–1.00)
GFR calc Af Amer: >60 mL/min (ref >60)
GFR calc non Af Amer: >60 mL/min (ref >60)
Glucose, Bld: 92 mg/dL (ref 70–99)
Potassium: 3.1 mmol/L — ABNORMAL LOW (ref 3.5–5.1)
Sodium: 141 mmol/L (ref 135–145)
Total Bilirubin: 0.3 mg/dL (ref 0.3–1.2)
Total Protein: 7.2 g/dL (ref 6.5–8.1)

## 2019-06-07 MED ORDER — FENTANYL CITRATE (PF) 100 MCG/2ML IJ SOLN
50.0000 ug | Freq: Once | INTRAMUSCULAR | Status: AC
  Administered 2019-06-07: 50 ug via INTRAVENOUS
  Filled 2019-06-07: qty 2

## 2019-06-07 MED ORDER — ONDANSETRON HCL 4 MG/2ML IJ SOLN
4.0000 mg | Freq: Once | INTRAMUSCULAR | Status: AC
  Administered 2019-06-07: 4 mg via INTRAVENOUS
  Filled 2019-06-07: qty 2

## 2019-06-07 MED ORDER — IOHEXOL 300 MG/ML  SOLN
100.0000 mL | Freq: Once | INTRAMUSCULAR | Status: AC | PRN
  Administered 2019-06-07: 100 mL via INTRAVENOUS

## 2019-06-07 MED ORDER — SODIUM CHLORIDE 0.9 % IV SOLN: Freq: Once | INTRAVENOUS | Status: AC

## 2019-06-07 MED ORDER — IOHEXOL 9 MG/ML PO SOLN
ORAL | Status: AC
  Administered 2019-06-07: 1 mL
  Filled 2019-06-07: qty 1000

## 2019-06-07 NOTE — ED Provider Notes (Signed)
[facility] EMERGENCY DEPARTMENT Provider Note   CSN: [id number] Arrival date & time: 06/07/19  1049     History Chief Complaint  Patient presents with  . Wound Check    Jacolyn L Rajkumar is a 39 y.o. female.  HPI   This patient is a 39 year old female, she has a history of chronic pain, history of multiple sclerosis, history of recent abdominal surgery to remove possible infected mesh from hernia repair, since that time has had increasing pain, complains of having discharge and drainage from a persistently draining laparotomy wound.  She has seen her surgeon this week, and reports to me that she is supposed to have a CT scan with oral contrast to evaluate for a small bowel fistula.  She is states that she has pain with even lifting her child, she is not having fevers but is having some ongoing drainage from the wound.  Symptoms are persistent  Past Medical History:  Diagnosis Date  . ADHD   . Anxiety   . Asthma as a child  . Chronic back pain   . Chronic pain   . Conversion disorder   . DDD (degenerative disc disease), cervical   . Depression   . External hemorrhoid   . Fatty liver   . GERD (gastroesophageal reflux disease)   . History of kidney stones   . HTN (hypertension) 02/27/2015  . JC virus antibody positive   . Migraines   . MS (multiple sclerosis) ([place])    06-2006  . Neuromuscular disorder ([place])    MS  . Neuropathy   . OA (osteoarthritis)   . Ovarian cyst   . Panic attack   . Perforated bowel ([place]) 2009   colostomy bag for 3 motnhs  . 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 PTSD (post-traumatic stress disorder)   . S/P emergency C-section   . Seasonal allergies   . Urinary urgency   . Vertigo   . Wears contact lenses   . Wears glasses     Patient Active Problem List   Diagnosis Date Noted  . Infected hernioplasty mesh ([place]) 03/19/2019  . 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 ADHD 02/22/2019  . Anxiety 02/22/2019  . S/P  emergency C-section   . MS (multiple sclerosis) ([place])   . Chronic pain   . Panic attack   . Open wound of abdominal wall, anterior, complicated, sequela [id number]  . Postoperative wound infection 09/20/2018  . Acute postoperative pain 08/25/2018  . History of substance use disorder 07/19/2018  . Nausea without vomiting 04/13/2018  . Nausea with vomiting 03/23/2018  . Pharmacologic therapy 03/16/2018  . Disorder of skeletal system 03/16/2018  . Problems influencing health status 03/16/2018  . Abnormal MRI, cervical spine (03/06/17) 03/16/2018  . Morbid obesity with BMI of 45.0-49.9, adult ([place]) 03/16/2018  . Chronic pain of both knees 02/18/2018  . Cervical facet hypertrophy 04/29/2017  . Cervical facet syndrome (Bilateral) (R>L) 04/29/2017  . Spondylosis without myelopathy or radiculopathy, cervical region 04/29/2017  . Spondylosis without myelopathy or radiculopathy, lumbosacral region 03/04/2017  . Cervicogenic headache 02/10/2017  . Occipital headache 02/10/2017  . Trigger point with back pain (Left) 02/10/2017  . History of fainting (vasovagal) 02/10/2017  . History of vasovagal syncope 02/10/2017  . Acute left-sided low back pain without sciatica 02/10/2017  . Neurogenic pain 01/23/2017  . Chronic musculoskeletal pain 12/02/2016  . Cervical radiculitis (Bilateral) 11/12/2016  . Chronic upper back pain (Primary Area of Pain) (Bilateral) (  R>L) 11/01/2016  . Chronic hand pain [facility] Area of Pain) (Bilateral) (L>R) 11/01/2016  . Chronic wrist pain [facility] Area of Pain) (Bilateral) (L>R) 11/01/2016  . Chronic foot/toes pain (Fourth Area of Pain) (Bilateral) (R>L) 11/01/2016  . Chronic upper extremity pain (Secondary Area of Pain) (Bilateral) (L>R) 11/01/2016  . Long term prescription benzodiazepine use 11/01/2016  . 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 Cervical foraminal stenosis (Bilateral) (C6-7) 11/01/2016  . Chronic CNS demyelinating disease  (MS) 11/01/2016  . DDD (degenerative disc disease), thoracic 11/01/2016  . DDD (degenerative disc disease), lumbar 11/01/2016  . Lumbar facet arthropathy 11/01/2016  . Lumbar facet syndrome (Bilateral) (R>L) 11/01/2016  . Vitamin D deficiency 10/28/2016  . Opiate use 07/23/2016  . Chronic neck pain (Primary Area of Pain) (Bilateral) (R>L) 07/23/2016  . Chronic low back pain (Secondary Area of Pain) (Bilateral) (L>R) 07/23/2016  . Chronic knee pain (Fifth Area of Pain) (Left) 07/23/2016  . Midline thoracic back pain 07/23/2016  . Chronic lower extremity pain (Bilateral) 07/23/2016  . Incarcerated incisional hernia 02/16/2016  . Incisional hernia 02/15/2016  . Carpal tunnel syndrome [facility] Area of Pain) (Bilateral) (L>R) 11/13/2015  . Substance abuse ([place]) 09/07/2015  . Paresthesia 08/02/2015  . Chronic pain syndrome 08/02/2015  . Postpartum endometritis 07/21/2015  . PTSD (post-traumatic stress disorder) 05/16/2015  . Severe episode of recurrent major depressive disorder, without psychotic features ([place]) 05/16/2015  . GAD (generalized anxiety disorder) 05/16/2015  . Difficult intravenous access 05/08/2015  . Right optic neuritis 04/27/2015  . Conversion disorder with attacks or seizures, persistent, with psychological stressor 03/28/2015  . Migraine with aura and without status migrainosus, not intractable 03/28/2015  . Generalized anxiety disorder 03/28/2015  . Conversion disorder 03/28/2015  . HTN (hypertension) 02/27/2015  . Perforated bowel ([place]) 02/27/2015  . Depression 12/27/2014  . Multiple sclerosis ([place]) 12/27/2014  . Migraine 12/27/2014  . Seizure disorder ([place]) 12/27/2014  . Smoker 12/27/2014  . Obesity 12/27/2014  . DDD (degenerative disc disease), cervical 07/28/2014  . Major depressive disorder, single episode 11/02/2013  . Laryngopharyngeal reflux 10/25/2010  . Tonsillitis 10/25/2010    Past Surgical History:  Procedure Laterality Date  . ABDOMINAL SURGERY     . ABDOMINAL WOUND EXPLORATION   03/19/2019   Abdominal Wound Exploration with Explantation of Mesh  . APPENDECTOMY    . BOWEL RESECTION  01/2007   with colostomy  . CESAREAN SECTION N/A 07/12/2015   Procedure: CESAREAN SECTION;  Surgeon: Guss Bunde, MD;  Location: [place];  Service: Obstetrics;  Laterality: N/A;  . COLON SURGERY     part of colon and intestines removed; colostomy 2008  . COLONOSCOPY WITH PROPOFOL N/A 01/29/2017   Procedure: COLONOSCOPY WITH PROPOFOL;  Surgeon: Ileana Roup, MD;  Location: [facility];  Service: General;  Laterality: N/A;  . COLOSTOMY CLOSURE  04/2007  . EVALUATION UNDER ANESTHESIA WITH HEMORRHOIDECTOMY N/A 06/11/2018   Procedure: ANORECTAL EXAM UNDER ANESTHESIA WITH HEMORRHOIDECTOMY;  Surgeon: Ileana Roup, MD;  Location: [place];  Service: General;  Laterality: N/A;  . EXCISIONAL HEMORRHOIDECTOMY    . EXTREMITY CYST EXCISION  1994   right leg  . HERNIA REPAIR    . INCISIONAL HERNIA REPAIR N/A 02/16/2016   Procedure: HERNIA REPAIR INCISIONAL;  Surgeon: Fanny Skates, MD;  Location: [facility];  Service: General;  Laterality: N/A;  . INSERTION OF MESH  02/16/2016   Procedure: INSERTION OF MESH;  Surgeon: Fanny Skates, MD;  Location: [facility];  Service: General;;  . LAPAROTOMY  03/19/2019   Procedure: Abdominal Wound Exploration with Explantation of Mesh;  Surgeon: Ralene Ok, MD;  Location: [place];  Service: General;;  . RADIOLOGY WITH ANESTHESIA N/A 03/06/2017   Procedure: MRI WITH ANESTHESIA OF CERVICAL SPINE WITHOUT CONTRAST, MRI OF LUMBAR SPINE WITHOUT CONTRAST;  Surgeon: Radiologist, Medication, MD;  Location: [place];  Service: Radiology;  Laterality: N/A;  . RADIOLOGY WITH ANESTHESIA N/A 09/15/2018   Procedure: MRI OF BRAIN WITH AND WITHOUT CONTRAST;  Surgeon: Radiologist, Medication, MD;  Location: [place];  Service: Radiology;  Laterality: N/A;  . RADIOLOGY WITH ANESTHESIA N/A 10/13/2018   Procedure: MRI OF  BRAIN WITH AND WITHOUT CONTRAST;  Surgeon: Radiologist, Medication, MD;  Location: [place];  Service: Radiology;  Laterality: N/A;  . RADIOLOGY WITH ANESTHESIA N/A 10/26/2018   Procedure: MRI UNDER ANESTHESIA; HEAD, CERVICAL AND THORASCIC SPINE;  Surgeon: Radiologist, Medication, MD;  Location: [place];  Service: Radiology;  Laterality: N/A;  . SCAR REVISION  01/21/2011   Procedure: SCAR REVISION;  Surgeon: Hermelinda Dellen;  Location: [place];  Service: Plastics;  Laterality: N/A;  exploration of scar of abdomen and repair of defect  . TOOTH EXTRACTION Left 10/2016  . TRANSRECTAL DRAINAGE OF PELVIC ABSCESS       OB History    Gravida  1   Para  1   Term      Preterm  1   AB      Living  1     SAB      TAB      Ectopic      Multiple  0   Live Births  1           Family History  Problem Relation Age of Onset  . Diabetes Mother   . Hypertension Mother   . Diabetes Father   . Hypertension Father   . Arthritis Father   . Cancer Maternal Grandmother   . Cancer Maternal Grandfather   . Alcohol abuse Neg Hx   . Anxiety disorder Neg Hx   . Bipolar disorder Neg Hx   . Drug abuse Neg Hx   . Depression Neg Hx   . Colon cancer Neg Hx     Social History   Tobacco Use  . Smoking status: Former Smoker    Packs/day: 0.25    Years: 10.00    Pack years: 2.50    Types: Cigarettes    Quit date: 07/12/2015    Years since quitting: 3.9  . Smokeless tobacco: Never Used  Substance Use Topics  . Alcohol use: No    Alcohol/week: 0.0 standard drinks  . Drug use: No    Comment: marinol shows up as THC    Home Medications Prior to Admission medications   Medication Sig Start Date End Date Taking? Authorizing Provider  AIMOVIG 140 MG/ML SOAJ Inject 140 mg into the muscle every 30 (thirty) days. 03/29/19  Yes Cameron Sprang, MD  albuterol (PROVENTIL HFA;VENTOLIN HFA) 108 (90 Base) MCG/ACT inhaler Inhale 2 puffs into the lungs every 6 (six) hours as needed  for wheezing or shortness of breath. Patient taking differently: Inhale 2 puffs into the lungs every 4 (four) hours as needed for wheezing or shortness of breath.  02/15/15  Yes Teague Bobbye Morton, PA-C  amLODipine (NORVASC) 5 MG tablet Take 5 mg by mouth daily.  01/23/16  Yes [provider]  diclofenac Sodium (VOLTAREN) 1 % GEL Apply 1 application  topically 2 (two) times daily as needed (knee and back pain).    Yes [provider]  diphenhydrAMINE (BENADRYL) 25 MG tablet Take 50 mg by mouth at bedtime.   Yes [provider]  divalproex (DEPAKOTE) 500 MG DR tablet Take 1 tablet (500 mg total) by mouth at bedtime. 03/29/19  Yes Cameron Sprang, MD  dronabinol (MARINOL) 2.5 MG capsule Take 1 capsule (2.5 mg total) by mouth 3 (three) times daily. 06/01/19  Yes Annitta Needs, NP  Fingolimod HCl (GILENYA) 0.5 MG CAPS Take 1 capsule (0.5 mg total) by mouth daily. 03/29/19  Yes Cameron Sprang, MD  loratadine (CLARITIN) 10 MG tablet Take 10 mg by mouth daily as needed for allergies.   Yes [provider]  methocarbamol (ROBAXIN) 750 MG tablet Take 1 tablet (750 mg total) by mouth 2 (two) times daily as needed for muscle spasms. 03/30/19 09/26/19 Yes Milinda Pointer, MD  omeprazole (PRILOSEC) 20 MG capsule TAKE 1 CAPSULE (20 MG TOTAL) BY MOUTH 2 (TWO) TIMES DAILY BEFORE A MEAL. 06/18/18  Yes Annitta Needs, NP  OVER THE COUNTER MEDICATION Take 1-2 tablets by mouth 3 (three) times daily as needed (anxiety or pain). CBD Gummies   Yes [provider]  potassium chloride SA (KLOR-CON) 20 MEQ tablet Take 1 tablet (20 mEq total) by mouth daily. 05/29/19  Yes Petrucelli, Samantha R, PA-C  pregabalin (LYRICA) 225 MG capsule Take 1 capsule (225 mg total) by mouth 2 (two) times daily. 06/02/19 11/29/19 Yes Milinda Pointer, MD  topiramate (TOPAMAX) 100 MG tablet Take 1.5 tablets twice a day Patient taking differently: Take 150 mg by mouth 2 (two) times daily. Take 1.5 tablets  twice a day 03/29/19  Yes Cameron Sprang, MD  EPINEPHrine (EPIPEN) 0.3 mg/0.3 mL SOAJ injection Inject 0.3 mg into the muscle as needed for anaphylaxis.     [provider]  etonogestrel (NEXPLANON) 68 MG IMPL implant 1 each by Subdermal route once.    [provider]    Allergies    Baclofen, Duloxetine hcl, Gabapentin, Monosodium glutamate, Other, Acetaminophen, Alprazolam, Amitriptyline, Magnesium salicylate, Modafinil, Rizatriptan, Tizanidine, Vicodin [hydrocodone-acetaminophen], Adhesive [tape], Hydrocodone-acetaminophen, Ketorolac tromethamine, Lamotrigine, Nsaids, and Tramadol  Review of Systems   Review of Systems  All other systems reviewed and are negative.   Physical Exam Updated Vital Signs BP 128/77   Pulse 78   Temp 98.3 F (36.8 C) (Oral)   Resp 16   Ht 1.626 m (5\' 4" )   Wt 113.4 kg   SpO2 100%   BMI 42.91 kg/m   Physical Exam Vitals and nursing note reviewed.  Constitutional:      General: She is not in acute distress.    Appearance: She is well-developed.  HENT:     Head: Normocephalic and atraumatic.     Mouth/Throat:     Pharynx: No oropharyngeal exudate.  Eyes:     General: No scleral icterus.       Right eye: No discharge.        Left eye: No discharge.     Conjunctiva/sclera: Conjunctivae normal.     Pupils: Pupils are equal, round, and reactive to light.  Neck:     Thyroid: No thyromegaly.     Vascular: No JVD.  Cardiovascular:     Rate and Rhythm: Normal rate and regular rhythm.     Heart sounds: Normal heart sounds. No murmur. No friction rub. No gallop.   Pulmonary:     Effort: Pulmonary  effort is normal. No respiratory distress.     Breath sounds: Normal breath sounds. No wheezing or rales.  Abdominal:     General: Bowel sounds are normal. There is no distension.     Palpations: Abdomen is soft. There is no mass.     Tenderness: There is no abdominal tenderness.     Comments: Obese abdomen, mildly tender bilateral  lower abdomen, no guarding or peritoneal signs, inferior aspect of the central incision shows a very small wound, there is no significant active drainage, no foul smell, the dressing in place does have a small amount of yellow material on it  Musculoskeletal:        General: No tenderness. Normal range of motion.     Cervical back: Normal range of motion and neck supple.  Lymphadenopathy:     Cervical: No cervical adenopathy.  Skin:    General: Skin is warm and dry.     Findings: No erythema or rash.  Neurological:     Mental Status: She is alert.     Coordination: Coordination normal.  Psychiatric:        Behavior: Behavior normal.     ED Results / Procedures / Treatments   Labs (all labs ordered are listed, but only abnormal results are displayed) Labs Reviewed  COMPREHENSIVE METABOLIC PANEL - Abnormal; Notable for the following components:      Result Value   Potassium 3.1 (*)    Calcium 8.7 (*)    All other components within normal limits  CBC    EKG None  Radiology CT ABDOMEN PELVIS W CONTRAST  Result Date: 06/07/2019 CLINICAL DATA:  39 year old female with increasing abdominal and pelvic pain. History of multiple abdominal surgeries with history of infected abdominal wall mesh would agreement in March 2021. EXAM: CT ABDOMEN AND PELVIS WITH CONTRAST TECHNIQUE: Multidetector CT imaging of the abdomen and pelvis was performed using the standard protocol following bolus administration of intravenous contrast. CONTRAST:  119mL OMNIPAQUE IOHEXOL 300 MG/ML  SOLN COMPARISON:  05/29/2019 CT and prior studies FINDINGS: Lower chest: No acute abnormality. Hepatobiliary: The liver and gallbladder are unremarkable. No biliary dilatation. Pancreas: Unremarkable Spleen: Unremarkable Adrenals/Urinary Tract: The kidneys, adrenal glands and bladder are unremarkable except for unchanged punctate nonobstructing lower pole renal calculi. Stomach/Bowel: There is no evidence of bowel obstruction,  bowel wall thickening or inflammatory changes. Surgical changes of the sigmoid colon noted. Vascular/Lymphatic: No significant vascular findings are present. No enlarged abdominal or pelvic lymph nodes. Reproductive: No significant change or acute abnormality. Other: Surgical changes/soft tissue along the anterior abdominal wall is unchanged. There is no evidence of extraluminal oral contrast noted. No ascites or pneumoperitoneum identified. Musculoskeletal: No acute or suspicious bony abnormalities identified. Moderate facet arthropathy at L5-S1 again noted. IMPRESSION: 1. No evidence of acute abnormality. 2. Unchanged surgical changes/soft tissue along the anterior abdominal wall. No evidence of extraluminal oral contrast. 3. Punctate nonobstructing lower pole renal calculi. Electronically Signed   By: Margarette Canada M.D.   On: 06/07/2019 14:42    Procedures Procedures (including critical care time)  Medications Ordered in ED Medications  0.9 %  sodium chloride infusion ( Intravenous New Bag/Given 06/07/19 1200)  ondansetron (ZOFRAN) injection 4 mg (4 mg Intravenous Given 06/07/19 1201)  fentaNYL (SUBLIMAZE) injection 50 mcg (50 mcg Intravenous Given 06/07/19 1202)  iohexol (OMNIPAQUE) 9 MG/ML oral solution (1 mL  Contrast Given 06/07/19 1315)  iohexol (OMNIPAQUE) 300 MG/ML solution 100 mL (100 mLs Intravenous Contrast Given 06/07/19 1420)  ED Course  I have reviewed the triage vital signs and the nursing notes.  Pertinent labs & imaging results that were available during my care of the patient were reviewed by me and considered in my medical decision making (see chart for details).    MDM Rules/Calculators/A&P                      Patient is tearful, appears to have ongoing small amount of drainage, concern for fistula based on surgical notes that the patient is able to show me, I do not see any notes in the medical record to this effect however a CT scan with contrast was requested by general  surgeon to be done, the patient presents with increasing pain thus we will do it today instead of waiting for this to be done as an outpatient.  She does not have a surgical abdomen.  She had a CT scan done last week which did not show any intra-abdominal significant pathology  I have reviewed the patient's lab work which shows no leukocytosis, no metabolic abnormalities and her CT scan which shows no signs of obvious fistula.  There is ongoing mild soft tissue changes secondary to the surgery but no signs of abscess or deep tissue infection.  The patient has been given reassurance that she does not have an acute surgical cause of her pain.  She can follow-up with general surgery as per her appointment.  No indication for admission to the hospital or opiate use for her chronic pain.  Final Clinical Impression(s) / ED Diagnoses Final diagnoses:  Chronic abdominal pain  Post-op pain      Noemi Chapel, MD 06/07/19 1455

## 2019-06-07 NOTE — ED Triage Notes (Signed)
Pt states she is having pain to her abdomen after having surgery; pt states it feels like the wound is "plugged up and is building pressure"

## 2019-06-07 NOTE — Discharge Instructions (Addendum)
Your CT scan shows no signs of a fistula, it is unchanged from the last CAT scan.  It appears that you have some surgical changes in the skin and soft tissue which is normal after surgery, there is no signs of infection or abscess, your blood work was normal.  You will need to follow-up with your doctor for ongoing pain control.  Please see your general surgeon at your follow-up appointment.

## 2019-06-08 ENCOUNTER — Other Ambulatory Visit: Payer: Self-pay | Admitting: General Surgery

## 2019-06-08 ENCOUNTER — Other Ambulatory Visit (HOSPITAL_COMMUNITY): Payer: Self-pay | Admitting: General Surgery

## 2019-06-08 DIAGNOSIS — L988 Other specified disorders of the skin and subcutaneous tissue: Secondary | ICD-10-CM

## 2019-06-08 DIAGNOSIS — Z9889 Other specified postprocedural states: Secondary | ICD-10-CM

## 2019-06-09 ENCOUNTER — Ambulatory Visit: Payer: Self-pay | Admitting: General Surgery

## 2019-06-09 NOTE — H&P (View-Only) (Signed)
  History of Present Illness Ralene Ok MD; 06/09/2019 2:38 PM) The patient is a 39 year old female who presents with a complaint of abdominal drainage.  ---------- patient is a 39 year old female who comes back in today secondary to continued intra-abdominal pain and drainage. Patient underwent CT scan in the ER secondary to pain. Patient had by mouth contrast. There was not any communications or any fistula could be seen however patient states that she continue with drainage and she has me videos and pictures of the drainage which appears to be succus.    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 This was in April 2021. Patient states that approximately 2-3 weeks ago. She's had some significant drainage from her lower midline wound. There was an area that was not healing and continue not to heal. Patient showed me videos and pictures of what appears to be succus draining from the wound site. There appears to have some irritation.  Patient recently had a CT scan this past Saturday with IV contrast, no by mouth contrast. There appears to be bowel up to the anterior abdominal wall. per radiology umbilical abscess.  I reviewed my operative note patient appears at the patient had Novafils that were holding the abdominal wall closure together. The abdomen was not entered. No further stitches were placed.    Physical Exam Ralene Ok, MD; 06/09/2019 2:39 PM) The physical exam findings are as follows: Note: Constitutional: No acute distress, conversant, appears stated age  Eyes: Anicteric sclerae, moist conjunctiva, no lid lag  Neck: No thyromegaly, trachea midline, no cervical lymphadenopathy  Lungs: Clear to auscultation biilaterally, normal respiratory effot  Cardiovascular: regular rate & rhythm, no murmurs, no peripheal edema, pedal pulses 2+  GI: Soft, no masses or  hepatosplenomegaly, non-tender to palpation, small punctate opening in the lower part of her incision. There is currently no drainage however there appears to be some irritation and erythema around the opening.  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 This area continues to decompress causes pain when is not decompressing. She continues with a punctate area lower portion of her incision.  We'll proceed to the operating room for diagnostic laparoscopy, lysis of adhesions, exploratory laparoscopy and likely small bowel resection.  I discussed the risks and benefits of the procedure to include but not limited to: Infection, bleeding, damages running structures, possible ostomy, possible need for further surgery. Patient was understanding and wishes to proceed.

## 2019-06-09 NOTE — H&P (Signed)
  History of Present Illness Ralene Ok MD; 06/09/2019 2:38 PM) The patient is a 39 year old female who presents with a complaint of abdominal drainage.  ---------- patient is a 39 year old female who comes back in today secondary to continued intra-abdominal pain and drainage. Patient underwent CT scan in the ER secondary to pain. Patient had by mouth contrast. There was not any communications or any fistula could be seen however patient states that she continue with drainage and she has me videos and pictures of the drainage which appears to be succus.    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 This was in April 2021. Patient states that approximately 2-3 weeks ago. She's had some significant drainage from her lower midline wound. There was an area that was not healing and continue not to heal. Patient showed me videos and pictures of what appears to be succus draining from the wound site. There appears to have some irritation.  Patient recently had a CT scan this past Saturday with IV contrast, no by mouth contrast. There appears to be bowel up to the anterior abdominal wall. per radiology umbilical abscess.  I reviewed my operative note patient appears at the patient had Novafils that were holding the abdominal wall closure together. The abdomen was not entered. No further stitches were placed.    Physical Exam Ralene Ok, MD; 06/09/2019 2:39 PM) The physical exam findings are as follows: Note: Constitutional: No acute distress, conversant, appears stated age  Eyes: Anicteric sclerae, moist conjunctiva, no lid lag  Neck: No thyromegaly, trachea midline, no cervical lymphadenopathy  Lungs: Clear to auscultation biilaterally, normal respiratory effot  Cardiovascular: regular rate & rhythm, no murmurs, no peripheal edema, pedal pulses 2+  GI: Soft, no masses or  hepatosplenomegaly, non-tender to palpation, small punctate opening in the lower part of her incision. There is currently no drainage however there appears to be some irritation and erythema around the opening.  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 This area continues to decompress causes pain when is not decompressing. She continues with a punctate area lower portion of her incision.  We'll proceed to the operating room for diagnostic laparoscopy, lysis of adhesions, exploratory laparoscopy and likely small bowel resection.  I discussed the risks and benefits of the procedure to include but not limited to: Infection, bleeding, damages running structures, possible ostomy, possible need for further surgery. Patient was understanding and wishes to proceed.

## 2019-06-10 ENCOUNTER — Other Ambulatory Visit (HOSPITAL_COMMUNITY)
Admission: RE | Admit: 2019-06-10 | Discharge: 2019-06-10 | Disposition: A | Discharge status: Home / Self Care | Payer: Medicare Other | Source: Ambulatory Visit | Attending: General Surgery | Admitting: General Surgery

## 2019-06-10 DIAGNOSIS — Z01812 Encounter for preprocedural laboratory examination: Secondary | ICD-10-CM | POA: Diagnosis present

## 2019-06-10 DIAGNOSIS — Z20822 Contact with and (suspected) exposure to covid-19: Secondary | ICD-10-CM | POA: Diagnosis not present

## 2019-06-10 LAB — SARS CORONAVIRUS 2 (TAT 6-24 HRS): SARS Coronavirus 2: NEGATIVE

## 2019-06-11 ENCOUNTER — Encounter (HOSPITAL_COMMUNITY): Payer: Self-pay | Admitting: General Surgery

## 2019-06-11 ENCOUNTER — Other Ambulatory Visit: Payer: Self-pay

## 2019-06-11 ENCOUNTER — Other Ambulatory Visit (HOSPITAL_COMMUNITY): Payer: Medicare Other

## 2019-06-11 MED ORDER — METRONIDAZOLE IN NACL 5-0.79 MG/ML-% IV SOLN
500.0000 mg | INTRAVENOUS | Status: AC
  Administered 2019-06-14: 500 mg via INTRAVENOUS
  Filled 2019-06-11 (×2): qty 100

## 2019-06-11 MED ORDER — SODIUM CHLORIDE 0.9 % IV SOLN
2.0000 g | INTRAVENOUS | Status: AC
  Administered 2019-06-14: 2 g via INTRAVENOUS
  Filled 2019-06-11 (×2): qty 20

## 2019-06-11 NOTE — Progress Notes (Signed)
Ms Emily [name] denies chest pain or shortness of breath. Patient tested negative for Covid_6/3/21_ and has been in quarantine since that time.  I instructed Ms Emily [name] on ERAS Diet, Patient tives in [place] and will not be able to come to pick up Pre Surgery Ensure.

## 2019-06-13 NOTE — Anesthesia Preprocedure Evaluation (Addendum)
Anesthesia Evaluation  Patient identified by MRN, date of birth, ID band Patient awake    Reviewed: Allergy & Precautions, H&P , NPO status , Patient's Chart, lab work & pertinent test results  Airway Mallampati: II  TM Distance: >3 FB Neck ROM: Full    Dental no notable dental hx. (+) Teeth Intact, Dental Advisory Given   Pulmonary asthma , former smoker,    Pulmonary exam normal breath sounds clear to auscultation       Cardiovascular Exercise Tolerance: Good hypertension, Pt. on medications  Rhythm:Regular Rate:Normal     Neuro/Psych  Headaches, Seizures -,  Anxiety Depression    GI/Hepatic Neg liver ROS, GERD  Medicated,  Endo/Other  Morbid obesity  Renal/GU negative Renal ROS  negative genitourinary   Musculoskeletal  (+) Arthritis , Osteoarthritis,  Fibromyalgia -  Abdominal   Peds  Hematology negative hematology ROS (+)   Anesthesia Other Findings   Reproductive/Obstetrics negative OB ROS                            Anesthesia Physical Anesthesia Plan  ASA: III  Anesthesia Plan: General   Post-op Pain Management:    Induction: Intravenous  PONV Risk Score and Plan: 4 or greater and Ondansetron, Dexamethasone, Midazolam and Scopolamine patch - Pre-op  Airway Management Planned: Oral ETT  Additional Equipment:   Intra-op Plan:   Post-operative Plan: Extubation in OR  Informed Consent: I have reviewed the patients History and Physical, chart, labs and discussed the procedure including the risks, benefits and alternatives for the proposed anesthesia with the patient or authorized representative who has indicated his/her understanding and acceptance.     Dental advisory given  Plan Discussed with: CRNA  Anesthesia Plan Comments:        Anesthesia Quick Evaluation

## 2019-06-14 ENCOUNTER — Other Ambulatory Visit: Payer: Self-pay

## 2019-06-14 ENCOUNTER — Inpatient Hospital Stay (HOSPITAL_COMMUNITY): Payer: Medicare Other | Admitting: Anesthesiology

## 2019-06-14 ENCOUNTER — Inpatient Hospital Stay (HOSPITAL_COMMUNITY)
Admission: RE | Admit: 2019-06-14 | Discharge: 2019-06-17 | DRG: 857 | Disposition: A | Discharge status: Home / Self Care | Payer: Medicare Other | Attending: General Surgery | Admitting: General Surgery

## 2019-06-14 ENCOUNTER — Inpatient Hospital Stay (HOSPITAL_COMMUNITY): Payer: Medicare Other

## 2019-06-14 ENCOUNTER — Encounter (HOSPITAL_COMMUNITY)
Admission: RE | Disposition: A | Discharge status: Home / Self Care | Payer: Self-pay | Source: Home / Self Care | Attending: General Surgery

## 2019-06-14 ENCOUNTER — Encounter (HOSPITAL_COMMUNITY): Payer: Self-pay | Admitting: General Surgery

## 2019-06-14 DIAGNOSIS — K660 Peritoneal adhesions (postprocedural) (postinfection): Secondary | ICD-10-CM | POA: Diagnosis present

## 2019-06-14 DIAGNOSIS — L02216 Cutaneous abscess of umbilicus: Secondary | ICD-10-CM | POA: Diagnosis present

## 2019-06-14 DIAGNOSIS — T8142XA Infection following a procedure, deep incisional surgical site, initial encounter: Principal | ICD-10-CM | POA: Diagnosis present

## 2019-06-14 DIAGNOSIS — T8183XA Persistent postprocedural fistula, initial encounter: Secondary | ICD-10-CM | POA: Diagnosis present

## 2019-06-14 DIAGNOSIS — K632 Fistula of intestine: Secondary | ICD-10-CM | POA: Diagnosis present

## 2019-06-14 DIAGNOSIS — Z4659 Encounter for fitting and adjustment of other gastrointestinal appliance and device: Secondary | ICD-10-CM

## 2019-06-14 DIAGNOSIS — Z9049 Acquired absence of other specified parts of digestive tract: Secondary | ICD-10-CM

## 2019-06-14 HISTORY — DX: Fibromyalgia: M79.7

## 2019-06-14 HISTORY — DX: Pneumonia, unspecified organism: J18.9

## 2019-06-14 HISTORY — PX: LYSIS OF ADHESION: SHX5961

## 2019-06-14 HISTORY — PX: LAPAROSCOPY: SHX197

## 2019-06-14 HISTORY — PX: LAPAROTOMY: SHX154

## 2019-06-14 HISTORY — PX: BOWEL RESECTION: SHX1257

## 2019-06-14 LAB — POCT PREGNANCY, URINE: Preg Test, Ur: NEGATIVE

## 2019-06-14 SURGERY — LAPAROSCOPY, DIAGNOSTIC
Anesthesia: General | Site: Abdomen

## 2019-06-14 MED ORDER — FINGOLIMOD HCL 0.5 MG PO CAPS
0.5000 mg | ORAL_CAPSULE | Freq: Every day | ORAL | Status: DC
  Administered 2019-06-15 – 2019-06-17 (×3): 0.5 mg via ORAL
  Filled 2019-06-14 (×3): qty 1

## 2019-06-14 MED ORDER — PROPOFOL 10 MG/ML IV BOLUS
INTRAVENOUS | Status: AC
  Filled 2019-06-14: qty 20

## 2019-06-14 MED ORDER — ACETAMINOPHEN 500 MG PO TABS: 1000.0000 mg | ORAL_TABLET | ORAL | Status: DC

## 2019-06-14 MED ORDER — DIPHENHYDRAMINE HCL 25 MG PO CAPS
50.0000 mg | ORAL_CAPSULE | Freq: Every day | ORAL | Status: DC
  Administered 2019-06-14 – 2019-06-16 (×3): 50 mg via ORAL
  Filled 2019-06-14 (×3): qty 2

## 2019-06-14 MED ORDER — HYDROMORPHONE HCL 1 MG/ML IJ SOLN
INTRAMUSCULAR | Status: AC
  Filled 2019-06-14: qty 0.5

## 2019-06-14 MED ORDER — ERENUMAB-AOOE 140 MG/ML ~~LOC~~ SOAJ: 140.0000 mg | SUBCUTANEOUS | Status: DC

## 2019-06-14 MED ORDER — HYDROMORPHONE HCL 1 MG/ML IJ SOLN
INTRAMUSCULAR | Status: AC
  Administered 2019-06-14: 0.5 mg via INTRAVENOUS
  Filled 2019-06-14: qty 1

## 2019-06-14 MED ORDER — SCOPOLAMINE 1 MG/3DAYS TD PT72
1.0000 | MEDICATED_PATCH | TRANSDERMAL | Status: AC
  Administered 2019-06-14: 1.5 mg via TRANSDERMAL
  Filled 2019-06-14: qty 1

## 2019-06-14 MED ORDER — PHENYLEPHRINE HCL (PRESSORS) 10 MG/ML IV SOLN
INTRAVENOUS | Status: DC | PRN
  Administered 2019-06-14: 80 ug via INTRAVENOUS

## 2019-06-14 MED ORDER — POTASSIUM CHLORIDE CRYS ER 20 MEQ PO TBCR
20.0000 meq | EXTENDED_RELEASE_TABLET | Freq: Every day | ORAL | Status: DC
  Administered 2019-06-16 – 2019-06-17 (×2): 20 meq via ORAL
  Filled 2019-06-14 (×3): qty 1

## 2019-06-14 MED ORDER — PROPOFOL 10 MG/ML IV BOLUS
INTRAVENOUS | Status: DC | PRN
  Administered 2019-06-14: 150 mg via INTRAVENOUS

## 2019-06-14 MED ORDER — ETONOGESTREL 68 MG ~~LOC~~ IMPL: 1.0000 | DRUG_IMPLANT | Freq: Once | SUBCUTANEOUS | Status: DC

## 2019-06-14 MED ORDER — CHLORHEXIDINE GLUCONATE 0.12 % MT SOLN
15.0000 mL | Freq: Once | OROMUCOSAL | Status: AC
  Administered 2019-06-14: 15 mL via OROMUCOSAL
  Filled 2019-06-14: qty 15

## 2019-06-14 MED ORDER — AMLODIPINE BESYLATE 5 MG PO TABS
5.0000 mg | ORAL_TABLET | Freq: Every day | ORAL | Status: DC
  Administered 2019-06-15 – 2019-06-17 (×3): 5 mg via ORAL
  Filled 2019-06-14 (×3): qty 1

## 2019-06-14 MED ORDER — KETAMINE HCL 10 MG/ML IJ SOLN
INTRAMUSCULAR | Status: DC | PRN
Start: 2019-06-14 — End: 2019-06-14
  Administered 2019-06-14 (×4): 10 mg via INTRAVENOUS

## 2019-06-14 MED ORDER — CHLORHEXIDINE GLUCONATE CLOTH 2 % EX PADS: 6.0000 | MEDICATED_PAD | Freq: Once | CUTANEOUS | Status: DC

## 2019-06-14 MED ORDER — METHOCARBAMOL 750 MG PO TABS
750.0000 mg | ORAL_TABLET | Freq: Two times a day (BID) | ORAL | Status: DC | PRN
  Administered 2019-06-15 – 2019-06-17 (×4): 750 mg via ORAL
  Filled 2019-06-14 (×4): qty 1

## 2019-06-14 MED ORDER — PANTOPRAZOLE SODIUM 40 MG PO TBEC
40.0000 mg | DELAYED_RELEASE_TABLET | Freq: Every day | ORAL | Status: DC
  Administered 2019-06-15 – 2019-06-17 (×3): 40 mg via ORAL
  Filled 2019-06-14 (×3): qty 1

## 2019-06-14 MED ORDER — PREGABALIN 75 MG PO CAPS
225.0000 mg | ORAL_CAPSULE | Freq: Two times a day (BID) | ORAL | Status: DC
  Administered 2019-06-14 – 2019-06-17 (×6): 225 mg via ORAL
  Filled 2019-06-14 (×6): qty 3

## 2019-06-14 MED ORDER — BUPIVACAINE HCL (PF) 0.25 % IJ SOLN
INTRAMUSCULAR | Status: AC
  Filled 2019-06-14: qty 30

## 2019-06-14 MED ORDER — 0.9 % SODIUM CHLORIDE (POUR BTL) OPTIME
TOPICAL | Status: DC | PRN
  Administered 2019-06-14: 1000 mL

## 2019-06-14 MED ORDER — KETAMINE HCL 50 MG/5ML IJ SOSY
PREFILLED_SYRINGE | INTRAMUSCULAR | Status: AC
  Filled 2019-06-14: qty 5

## 2019-06-14 MED ORDER — DEXTROSE-NACL 5-0.9 % IV SOLN: INTRAVENOUS | Status: DC

## 2019-06-14 MED ORDER — HYDROMORPHONE HCL 1 MG/ML IJ SOLN: 0.2500 mg | INTRAMUSCULAR | Status: DC | PRN

## 2019-06-14 MED ORDER — ROCURONIUM BROMIDE 10 MG/ML (PF) SYRINGE
PREFILLED_SYRINGE | INTRAVENOUS | Status: DC | PRN
  Administered 2019-06-14: 20 mg via INTRAVENOUS
  Administered 2019-06-14: 60 mg via INTRAVENOUS

## 2019-06-14 MED ORDER — ENSURE PRE-SURGERY PO LIQD: 296.0000 mL | Freq: Once | ORAL | Status: DC

## 2019-06-14 MED ORDER — ACETAMINOPHEN 10 MG/ML IV SOLN
INTRAVENOUS | Status: AC
  Administered 2019-06-14: 1000 mg via INTRAVENOUS
  Filled 2019-06-14: qty 100

## 2019-06-14 MED ORDER — MIDAZOLAM HCL 2 MG/2ML IJ SOLN
INTRAMUSCULAR | Status: AC
  Filled 2019-06-14: qty 2

## 2019-06-14 MED ORDER — MIDAZOLAM HCL 5 MG/5ML IJ SOLN
INTRAMUSCULAR | Status: DC | PRN
  Administered 2019-06-14: 2 mg via INTRAVENOUS

## 2019-06-14 MED ORDER — ACETAMINOPHEN 10 MG/ML IV SOLN: 1000.0000 mg | Freq: Once | INTRAVENOUS | Status: AC

## 2019-06-14 MED ORDER — GABAPENTIN 300 MG PO CAPS: 300.0000 mg | ORAL_CAPSULE | ORAL | Status: DC

## 2019-06-14 MED ORDER — LIDOCAINE 2% (20 MG/ML) 5 ML SYRINGE
INTRAMUSCULAR | Status: DC | PRN
  Administered 2019-06-14: 60 mg via INTRAVENOUS

## 2019-06-14 MED ORDER — HYDROMORPHONE HCL 1 MG/ML IJ SOLN
1.0000 mg | INTRAMUSCULAR | Status: DC | PRN
  Administered 2019-06-14 (×2): 1 mg via INTRAVENOUS
  Filled 2019-06-14 (×2): qty 1

## 2019-06-14 MED ORDER — ONDANSETRON HCL 4 MG/2ML IJ SOLN: 4.0000 mg | Freq: Four times a day (QID) | INTRAMUSCULAR | Status: DC | PRN

## 2019-06-14 MED ORDER — ALBUTEROL SULFATE (2.5 MG/3ML) 0.083% IN NEBU: 3.0000 mL | INHALATION_SOLUTION | RESPIRATORY_TRACT | Status: DC | PRN

## 2019-06-14 MED ORDER — HYDRALAZINE HCL 20 MG/ML IJ SOLN: 10.0000 mg | INTRAMUSCULAR | Status: DC | PRN

## 2019-06-14 MED ORDER — TOPIRAMATE 25 MG PO TABS
150.0000 mg | ORAL_TABLET | Freq: Two times a day (BID) | ORAL | Status: DC
  Administered 2019-06-14 – 2019-06-17 (×6): 150 mg via ORAL
  Filled 2019-06-14 (×8): qty 2

## 2019-06-14 MED ORDER — FENTANYL CITRATE (PF) 250 MCG/5ML IJ SOLN
INTRAMUSCULAR | Status: AC
  Filled 2019-06-14: qty 5

## 2019-06-14 MED ORDER — DIVALPROEX SODIUM 500 MG PO DR TAB
500.0000 mg | DELAYED_RELEASE_TABLET | Freq: Every day | ORAL | Status: DC
  Administered 2019-06-14 – 2019-06-16 (×3): 500 mg via ORAL
  Filled 2019-06-14 (×3): qty 1

## 2019-06-14 MED ORDER — DRONABINOL 2.5 MG PO CAPS
2.5000 mg | ORAL_CAPSULE | Freq: Three times a day (TID) | ORAL | Status: DC
  Administered 2019-06-14 – 2019-06-17 (×9): 2.5 mg via ORAL
  Filled 2019-06-14 (×9): qty 1

## 2019-06-14 MED ORDER — OXYCODONE HCL 5 MG PO TABS: 5.0000 mg | ORAL_TABLET | ORAL | Status: DC | PRN

## 2019-06-14 MED ORDER — ORAL CARE MOUTH RINSE: 15.0000 mL | Freq: Once | OROMUCOSAL | Status: AC

## 2019-06-14 MED ORDER — DEXAMETHASONE SODIUM PHOSPHATE 10 MG/ML IJ SOLN
INTRAMUSCULAR | Status: DC | PRN
  Administered 2019-06-14: 10 mg via INTRAVENOUS

## 2019-06-14 MED ORDER — HYDROMORPHONE HCL 1 MG/ML IJ SOLN
2.0000 mg | INTRAMUSCULAR | Status: DC | PRN
  Administered 2019-06-14 – 2019-06-16 (×13): 2 mg via INTRAVENOUS
  Filled 2019-06-14 (×13): qty 2

## 2019-06-14 MED ORDER — BUPIVACAINE HCL 0.25 % IJ SOLN
INTRAMUSCULAR | Status: DC | PRN
  Administered 2019-06-14: 7 mL

## 2019-06-14 MED ORDER — SUGAMMADEX SODIUM 500 MG/5ML IV SOLN
INTRAVENOUS | Status: DC | PRN
  Administered 2019-06-14: 250 mg via INTRAVENOUS

## 2019-06-14 MED ORDER — ONDANSETRON 4 MG PO TBDP: 4.0000 mg | ORAL_TABLET | Freq: Four times a day (QID) | ORAL | Status: DC | PRN

## 2019-06-14 MED ORDER — LORATADINE 10 MG PO TABS: 10.0000 mg | ORAL_TABLET | Freq: Every day | ORAL | Status: DC | PRN

## 2019-06-14 MED ORDER — ENOXAPARIN SODIUM 30 MG/0.3ML ~~LOC~~ SOLN
30.0000 mg | SUBCUTANEOUS | Status: DC
  Administered 2019-06-15 – 2019-06-17 (×3): 30 mg via SUBCUTANEOUS
  Filled 2019-06-14 (×3): qty 0.3

## 2019-06-14 MED ORDER — HYDROMORPHONE HCL 1 MG/ML IJ SOLN
INTRAMUSCULAR | Status: AC
  Administered 2019-06-14: 0.5 mg via INTRAVENOUS
  Filled 2019-06-14: qty 2

## 2019-06-14 MED ORDER — LACTATED RINGERS IV SOLN: INTRAVENOUS | Status: DC | PRN

## 2019-06-14 MED ORDER — SODIUM CHLORIDE 0.9 % IR SOLN
Status: DC | PRN
  Administered 2019-06-14: 1000 mL

## 2019-06-14 MED ORDER — FENTANYL CITRATE (PF) 100 MCG/2ML IJ SOLN
INTRAMUSCULAR | Status: DC | PRN
  Administered 2019-06-14 (×2): 50 ug via INTRAVENOUS
  Administered 2019-06-14: 100 ug via INTRAVENOUS
  Administered 2019-06-14: 50 ug via INTRAVENOUS

## 2019-06-14 MED ORDER — HYDRALAZINE HCL 20 MG/ML IJ SOLN
INTRAMUSCULAR | Status: AC
  Administered 2019-06-14: 10 mg via INTRAVENOUS
  Filled 2019-06-14: qty 1

## 2019-06-14 MED ORDER — HYDROMORPHONE HCL 1 MG/ML IJ SOLN
INTRAMUSCULAR | Status: DC | PRN
  Administered 2019-06-14: .5 mg via INTRAVENOUS

## 2019-06-14 MED ORDER — HYDROMORPHONE HCL 1 MG/ML IJ SOLN
0.2500 mg | INTRAMUSCULAR | Status: DC | PRN
  Administered 2019-06-14 (×4): 0.5 mg via INTRAVENOUS

## 2019-06-14 SURGICAL SUPPLY — 72 items
ADH SKN CLS APL DERMABOND .7 (GAUZE/BANDAGES/DRESSINGS)
APL PRP STRL LF DISP 70% ISPRP (MISCELLANEOUS) ×1
BLADE CLIPPER SURG (BLADE) IMPLANT
BRR ADH 5X3 SEPRAFILM 6 SHT (MISCELLANEOUS) ×1
CANISTER SUCT 3000ML PPV (MISCELLANEOUS) ×2 IMPLANT
CELLS DAT CNTRL 66122 CELL SVR (MISCELLANEOUS) ×1 IMPLANT
CHLORAPREP W/TINT 26 (MISCELLANEOUS) ×2 IMPLANT
COVER SURGICAL LIGHT HANDLE (MISCELLANEOUS) ×1 IMPLANT
COVER WAND RF STERILE (DRAPES) ×1 IMPLANT
DEFOGGER SCOPE WARMER CLEARIFY (MISCELLANEOUS) IMPLANT
DERMABOND ADVANCED (GAUZE/BANDAGES/DRESSINGS)
DERMABOND ADVANCED .7 DNX12 (GAUZE/BANDAGES/DRESSINGS) ×1 IMPLANT
DRAPE LAPAROSCOPIC ABDOMINAL (DRAPES) ×1 IMPLANT
DRAPE WARM FLUID 44X44 (DRAPES) ×2 IMPLANT
DRSG OPSITE POSTOP 4X10 (GAUZE/BANDAGES/DRESSINGS) IMPLANT
DRSG OPSITE POSTOP 4X8 (GAUZE/BANDAGES/DRESSINGS) IMPLANT
DRSG PAD ABDOMINAL 8X10 ST (GAUZE/BANDAGES/DRESSINGS) ×1 IMPLANT
ELECT BLADE 6.5 EXT (BLADE) IMPLANT
ELECT CAUTERY BLADE 6.4 (BLADE) ×2 IMPLANT
ELECT REM PT RETURN 9FT ADLT (ELECTROSURGICAL) ×2
ELECTRODE REM PT RTRN 9FT ADLT (ELECTROSURGICAL) ×1 IMPLANT
GAUZE PACKING IODOFORM 1/4X15 (PACKING) ×1 IMPLANT
GAUZE SPONGE 4X4 12PLY STRL (GAUZE/BANDAGES/DRESSINGS) ×1 IMPLANT
GLOVE BIO SURGEON STRL SZ7.5 (GLOVE) ×2 IMPLANT
GLOVE BIOGEL PI IND STRL 8 (GLOVE) ×1 IMPLANT
GLOVE BIOGEL PI INDICATOR 8 (GLOVE) ×1
GOWN STRL REUS W/ TWL LRG LVL3 (GOWN DISPOSABLE) ×2 IMPLANT
GOWN STRL REUS W/ TWL XL LVL3 (GOWN DISPOSABLE) ×1 IMPLANT
GOWN STRL REUS W/TWL LRG LVL3 (GOWN DISPOSABLE) ×8
GOWN STRL REUS W/TWL XL LVL3 (GOWN DISPOSABLE) ×2
HANDLE SUCTION POOLE (INSTRUMENTS) ×1 IMPLANT
KIT BASIN OR (CUSTOM PROCEDURE TRAY) ×2 IMPLANT
KIT TURNOVER KIT B (KITS) ×2 IMPLANT
LIGASURE IMPACT 36 18CM CVD LR (INSTRUMENTS) IMPLANT
NDL INSUFFLATION 14GA 120MM (NEEDLE) ×1 IMPLANT
NEEDLE INSUFFLATION 14GA 120MM (NEEDLE) ×2 IMPLANT
NS IRRIG 1000ML POUR BTL (IV SOLUTION) ×4 IMPLANT
PAD ARMBOARD 7.5X6 YLW CONV (MISCELLANEOUS) ×4 IMPLANT
PENCIL SMOKE EVACUATOR (MISCELLANEOUS) ×2 IMPLANT
RELOAD PROXIMATE 75MM BLUE (ENDOMECHANICALS) ×6 IMPLANT
RELOAD STAPLE 75 3.8 BLU REG (ENDOMECHANICALS) IMPLANT
RETRACTOR WND ALEXIS 18 MED (MISCELLANEOUS) IMPLANT
RTRCTR WOUND ALEXIS 18CM MED (MISCELLANEOUS) ×2
SCISSORS LAP 5X35 DISP (ENDOMECHANICALS) ×1 IMPLANT
SEPRAFILM PROCEDURAL PACK 3X5 (MISCELLANEOUS) ×1 IMPLANT
SET IRRIG TUBING LAPAROSCOPIC (IRRIGATION / IRRIGATOR) IMPLANT
SET TUBE SMOKE EVAC HIGH FLOW (TUBING) ×2 IMPLANT
SLEEVE ENDOPATH XCEL 5M (ENDOMECHANICALS) ×3 IMPLANT
SPECIMEN JAR LARGE (MISCELLANEOUS) IMPLANT
SPONGE LAP 18X18 RF (DISPOSABLE) IMPLANT
STAPLER PROXIMATE 75MM BLUE (STAPLE) ×1 IMPLANT
STAPLER VISISTAT 35W (STAPLE) ×2 IMPLANT
SUCTION POOLE HANDLE (INSTRUMENTS) ×2
SUT MNCRL AB 4-0 PS2 18 (SUTURE) ×1 IMPLANT
SUT NOVA NAB GS-21 0 18 T12 DT (SUTURE) ×2 IMPLANT
SUT PDS AB 1 TP1 54 (SUTURE) ×4 IMPLANT
SUT SILK 0 TIES 10X30 (SUTURE) ×1 IMPLANT
SUT SILK 2 0 SH CR/8 (SUTURE) ×2 IMPLANT
SUT SILK 2 0 TIES 10X30 (SUTURE) ×2 IMPLANT
SUT SILK 3 0 SH CR/8 (SUTURE) ×2 IMPLANT
SUT SILK 3 0 TIES 10X30 (SUTURE) ×2 IMPLANT
SUT VIC AB 3-0 SH 18 (SUTURE) ×1 IMPLANT
TOWEL GREEN STERILE (TOWEL DISPOSABLE) ×2 IMPLANT
TOWEL GREEN STERILE FF (TOWEL DISPOSABLE) ×2 IMPLANT
TRAY FOLEY MTR SLVR 16FR STAT (SET/KITS/TRAYS/PACK) ×2 IMPLANT
TRAY LAPAROSCOPIC MC (CUSTOM PROCEDURE TRAY) ×2 IMPLANT
TROCAR XCEL 12X100 BLDLESS (ENDOMECHANICALS) IMPLANT
TROCAR XCEL BLUNT TIP 100MML (ENDOMECHANICALS) IMPLANT
TROCAR XCEL NON-BLD 11X100MML (ENDOMECHANICALS) IMPLANT
TROCAR XCEL NON-BLD 5MMX100MML (ENDOMECHANICALS) ×2 IMPLANT
WARMER LAPAROSCOPE (MISCELLANEOUS) ×1 IMPLANT
YANKAUER SUCT BULB TIP NO VENT (SUCTIONS) ×1 IMPLANT

## 2019-06-14 NOTE — Anesthesia Procedure Notes (Signed)
Procedure Name: Intubation Date/Time: 06/14/2019 7:36 AM Performed by: Neldon Newport, CRNA Pre-anesthesia Checklist: Timeout performed, Patient being monitored, Suction available, Emergency Drugs available and Patient identified Patient Re-evaluated:Patient Re-evaluated prior to induction Oxygen Delivery Method: Circle system utilized Preoxygenation: Pre-oxygenation with 100% oxygen Induction Type: IV induction Ventilation: Mask ventilation without difficulty and Oral airway inserted - appropriate to patient size Laryngoscope Size: Mac and 3 Grade View: Grade I Tube type: Oral Tube size: 7.0 mm Number of attempts: 1 Placement Confirmation: ETT inserted through vocal cords under direct vision,  positive ETCO2 and breath sounds checked- equal and bilateral Secured at: 22 cm Tube secured with: Tape Dental Injury: Teeth and Oropharynx as per pre-operative assessment

## 2019-06-14 NOTE — Transfer of Care (Signed)
Immediate Anesthesia Transfer of Care Note  Patient: Emily [name]  Procedure(s) Performed: ATTEMPTED DIAGNOSTIC LAPAROSCOPY (N/A Abdomen) LYSIS OF ADHESIONS (N/A Abdomen) EXPLORATORY LAPAROTOMY (N/A Abdomen) SMALL BOWEL RESECTION (N/A Abdomen)  Patient Location: PACU  Anesthesia Type:General  Level of Consciousness: awake, alert  and oriented  Airway & Oxygen Therapy: Patient Spontanous Breathing and Patient connected to face mask oxygen  Post-op Assessment: Report given to RN, Post -op Vital signs reviewed and stable and Patient moving all extremities X 4  Post vital signs: Reviewed and stable  Last Vitals:  Vitals Value Taken Time  BP 154/130 06/14/19 0931  Temp    Pulse 95 06/14/19 0939  Resp 18 06/14/19 0939  SpO2 99 % 06/14/19 0939  Vitals shown include unvalidated device data.  Last Pain:  Vitals:   06/14/19 0711  TempSrc:   PainSc: 8          Complications: No apparent anesthesia complications

## 2019-06-14 NOTE — Plan of Care (Signed)
  Problem: Pain Managment: Goal: General experience of comfort will improve Outcome: Progressing  Discussed plan to control pain, IV pain meds and assistance with position changes.

## 2019-06-14 NOTE — Anesthesia Postprocedure Evaluation (Signed)
Anesthesia Post Note  Patient: Shaquita L Arizola  Procedure(s) Performed: ATTEMPTED DIAGNOSTIC LAPAROSCOPY (N/A Abdomen) LYSIS OF ADHESIONS (N/A Abdomen) EXPLORATORY LAPAROTOMY (N/A Abdomen) SMALL BOWEL RESECTION (N/A Abdomen)     Patient location during evaluation: PACU Anesthesia Type: General Level of consciousness: awake and alert Pain management: pain level controlled Vital Signs Assessment: post-procedure vital signs reviewed and stable Respiratory status: spontaneous breathing, nonlabored ventilation and respiratory function stable Cardiovascular status: blood pressure returned to baseline and stable Postop Assessment: no apparent nausea or vomiting Anesthetic complications: no    Last Vitals:  Vitals:   06/14/19 1115 06/14/19 1157  BP: (!) 131/102 126/77  Pulse: (!) 103 (!) 108  Resp: 12 20  Temp:  36.6 C  SpO2: 94% 99%    Last Pain:  Vitals:   06/14/19 1157  TempSrc: Oral  PainSc:                  Hiedi Touchton,W. EDMOND

## 2019-06-14 NOTE — Op Note (Signed)
06/14/2019  9:11 AM  PATIENT:  Emily [name]  39 y.o. female  PRE-OPERATIVE DIAGNOSIS:  SMALL BOWEL FISTULA  POST-OPERATIVE DIAGNOSIS:  Adhesions, SMALL BOWEL FISTULA  PROCEDURE:  Procedure(s): DIAGNOSTIC LAPAROSCOPY (N/A) LYSIS OF ADHESIONS (N/A)  (N/A) SMALL BOWEL RESECTION (N/A)  SURGEON:  Surgeon(s) and Role:    Ralene Ok, MD - Primary    Lemar Lofty, Puja, PA-C - Assisting who was essential for the diagnostic laparoscopy and assisting with resection of the bowel and closure of the abdomen.  ANESTHESIA:   local and general  EBL:  minimal   BLOOD ADMINISTERED:none  DRAINS: none   LOCAL MEDICATIONS USED:  BUPIVICAINE   SPECIMEN:  Source of Specimen:  Small bowel fistula  DISPOSITION OF SPECIMEN:  PATHOLOGY  COUNTS:  YES  TOURNIQUET:  * No tourniquets in log *  DICTATION: .Dragon Dictation Indication procedure: The patient is a 39 year old female who recently underwent infected mesh removal.  Patient came back several months postoperatively and was found to have small bowel fistula.  Patient was then taken back to the operating for diagnostic laparoscopy small bowel resection.  Findings: Patient small bowel anterior fistula output area.  There is definite connection that was approved Intra-Op.  A length of approximately 8 cm was resected area in the small bowel anastomosed.  Details of procedure:  After the patient was consented she was taken back to the OR and placed in the supine position with bilateral SCDs in place.  Patient underwent general endotracheal intubation.  Patient was a prepped and draped in the standard fashion.  A timeout was called all facts verified.  A Veress needle technique was used to insufflate the abdomen to 15 m of Marcaine the right subcostal margin.  Subsequent to this a 5 mm trocar and camera were then placed intra-abdominally.  There was no injury change abdominal organs.  A working 5 mm trocar was then placed in the right lower  quadrant as well as the epigastric area under direct visualization.  At this time it was apparent there was large amount of adhesions to the midline consistent both of small bowel as well as omentum.  These were taken down both sharply and with cautery to maintain hemostasis of the omentum.  The small bowel was then taken down sharply.  In the area of the fistula a T-tube was placed externally through the fistulous tract and could easily be seen into the small bowel.  This confirmed the small bowel fistula.  At this time the rest of the small bowel was taken down from anterior abdominal wall.  This was very adherent.  There was several Prolene knots and stitches that were within the serosa of the small bowel likely leading to the fistula.  Once this was completely taken down off the anterior abdominal wall a #10 blade was used to make a lower midline incision.  The previous fistulous track was excised.  The abdomen was entered.  Small bowel was eviscerated.  An Alexa loop retractor was placed on the abdominal wall.  At this time approximately 8 cm of the small bowel was resected using a 75 GIA stapler.  The mesentery was ligated using Kellys and 0 silk ties.  The corner of a staple line was excised.  Anastomosis was created using a another firing of the 75 GIA stapler.  The anastomosis was closed using another firing of 75 GIA stapler.  At this time the mesentery was reapproximated using 3 oh silks in a figure-of-eight fashion.  The area was checked for hemostasis.  This was placed back into the abdominal cavity.  This time the abdominal cavity was irrigated with sterile saline.  At this time there was some adhesion barrier that was placed to the abdominal cavity underneath the incision.  At this time the fascia/scar tissue was reapproximated using 0 Novafil's interrupted figure-of-eight fashion.  The subtenons tissue was irrigated out with sterile saline.  Subcutaneous tissue superiorly was reapproximated  using figure-of-eight 3-0 Vicryl's.  The inferior portion of the wound was left open and packed with quarter inch iodoform gauze.  The skin was stapled.  Trochars were removed.  The skin was reapproximated trocar sites using skin staples.  The wounds were then dressed with 4 x 4's and tape.  The patient tolerated the procedure well was taken to the recovery in stable condition.     PLAN OF CARE: Admit to inpatient   PATIENT DISPOSITION:  PACU - hemodynamically stable.   Delay start of Pharmacological VTE agent (>24hrs) due to surgical blood loss or risk of bleeding: no

## 2019-06-14 NOTE — Interval H&P Note (Signed)
History and Physical Interval Note:  06/14/2019 7:09 AM  Emily [name]  has presented today for surgery, with the diagnosis of SMALL BOWEL FISTULA.  The various methods of treatment have been discussed with the patient and family. After consideration of risks, benefits and other options for treatment, the patient has consented to  Procedure(s): DIAGNOSTIC LAPAROSCOPY (N/A) LYSIS OF ADHESIONS (N/A) EXPLORATORY LAPAROTOMY (N/A) SMALL BOWEL RESECTION (N/A) as a surgical intervention.  The patient's history has been reviewed, patient examined, no change in status, stable for surgery.  I have reviewed the patient's chart and labs.  Questions were answered to the patient's satisfaction.     Ralene Ok

## 2019-06-15 LAB — CBC
HCT: 41.2 % (ref 36.0–46.0)
Hemoglobin: 13.3 g/dL (ref 12.0–15.0)
MCH: 28.5 pg (ref 26.0–34.0)
MCHC: 32.3 g/dL (ref 30.0–36.0)
MCV: 88.4 fL (ref 80.0–100.0)
Platelets: 356 10*3/uL (ref 150–400)
RBC: 4.66 MIL/uL (ref 3.87–5.11)
RDW: 14.6 % (ref 11.5–15.5)
WBC: 14.2 10*3/uL — ABNORMAL HIGH (ref 4.0–10.5)
nRBC: 0 % (ref 0.0–0.2)

## 2019-06-15 LAB — BASIC METABOLIC PANEL
Anion gap: 11 (ref 5–15)
BUN: 8 mg/dL (ref 6–20)
CO2: 24 mmol/L (ref 22–32)
Calcium: 8.8 mg/dL — ABNORMAL LOW (ref 8.9–10.3)
Chloride: 105 mmol/L (ref 98–111)
Creatinine, Ser: 0.7 mg/dL (ref 0.44–1.00)
GFR calc Af Amer: >60 mL/min (ref >60)
GFR calc non Af Amer: >60 mL/min (ref >60)
Glucose, Bld: 116 mg/dL — ABNORMAL HIGH (ref 70–99)
Potassium: 3.7 mmol/L (ref 3.5–5.1)
Sodium: 140 mmol/L (ref 135–145)

## 2019-06-15 LAB — SURGICAL PATHOLOGY

## 2019-06-15 MED ORDER — FLUCONAZOLE IN SODIUM CHLORIDE 200-0.9 MG/100ML-% IV SOLN
200.0000 mg | Freq: Once | INTRAVENOUS | Status: AC
  Administered 2019-06-15: 200 mg via INTRAVENOUS
  Filled 2019-06-15: qty 100

## 2019-06-15 MED ORDER — CHLORHEXIDINE GLUCONATE CLOTH 2 % EX PADS
6.0000 | MEDICATED_PAD | Freq: Every day | CUTANEOUS | Status: DC
  Administered 2019-06-15: 6 via TOPICAL

## 2019-06-15 NOTE — Progress Notes (Signed)
1 Day Post-Op   Subjective/Chief Complaint: Pt with less pain    Objective: Vital signs in last 24 hours: Temp:  [97.9 F (36.6 C)-98.4 F (36.9 C)] 98.1 F (36.7 C) (06/08 0450) Pulse Rate:  [92-108] 92 (06/08 0450) Resp:  [12-20] 17 (06/08 0526) BP: (121-146)/(76-121) 145/121 (06/08 0829) SpO2:  [94 %-100 %] 100 % (06/08 0450)    Intake/Output from previous day: 06/07 0701 - 06/08 0700 In: 2998.4 [I.V.:2798.4; IV Piggyback:200] Out: 2460 [Urine:1860; Emesis/NG output:500; Blood:100] Intake/Output this shift: Total I/O In: 624.2 [I.V.:624.2] Out: -   General appearance: alert and cooperative GI: soft, non-tender; bowel sounds normal; no masses,  no organomegaly and incdressed  Lab Results:  Recent Labs    06/15/19 0133  WBC 14.2*  HGB 13.3  HCT 41.2  PLT 356   BMET Recent Labs    06/15/19 0133  NA 140  K 3.7  CL 105  CO2 24  GLUCOSE 116*  BUN 8  CREATININE 0.70  CALCIUM 8.8*   PT/INR No results for input(s): LABPROT, INR in the last 72 hours. ABG No results for input(s): PHART, HCO3 in the last 72 hours.  Invalid input(s): PCO2, PO2  Studies/Results: DG Abd Portable 1V  Result Date: 06/14/2019 CLINICAL DATA:  NG tube placement EXAM: PORTABLE ABDOMEN - 1 VIEW COMPARISON:  None. FINDINGS: NG tube is in the stomach.  Nonobstructive bowel gas pattern. IMPRESSION: NG tube in the stomach. Electronically Signed   By: Rolm Baptise M.D.   On: 06/14/2019 11:02    Anti-infectives: Anti-infectives (From admission, onward)   Start     Dose/Rate Route Frequency Ordered Stop   06/15/19 1100  fluconazole (DIFLUCAN) IVPB 200 mg     200 mg 100 mL/hr over 60 Minutes Intravenous  Once 06/15/19 1008     06/14/19 0600  cefTRIAXone (ROCEPHIN) 2 g in sodium chloride 0.9 % 100 mL IVPB     2 g 200 mL/hr over 30 Minutes Intravenous To ShortStay Surgical 06/11/19 0738 06/14/19 0800   06/14/19 0600  metroNIDAZOLE (FLAGYL) IVPB 500 mg     500 mg 100 mL/hr over 60  Minutes Intravenous To ShortStay Surgical 06/11/19 0738 06/14/19 0825      Assessment/Plan: s/p Procedure(s): ATTEMPTED DIAGNOSTIC LAPAROSCOPY (N/A) LYSIS OF ADHESIONS (N/A) EXPLORATORY LAPAROTOMY (N/A) SMALL BOWEL RESECTION (N/A) d/c foley  con't NGT Mobilize Diflucan for yeast infx   LOS: 1 day    Emily [name] 06/15/2019

## 2019-06-15 NOTE — Plan of Care (Signed)

## 2019-06-15 NOTE — Plan of Care (Signed)
  Problem: Education: Goal: Knowledge of General Education information will improve Description Including pain rating scale, medication(s)/side effects and non-pharmacologic comfort measures Outcome: Progressing   

## 2019-06-16 MED ORDER — OXYCODONE HCL 5 MG PO TABS
5.0000 mg | ORAL_TABLET | ORAL | Status: DC | PRN
  Administered 2019-06-16 – 2019-06-17 (×6): 10 mg via ORAL
  Filled 2019-06-16 (×6): qty 2

## 2019-06-16 MED ORDER — HYDROMORPHONE HCL 1 MG/ML IJ SOLN
1.0000 mg | INTRAMUSCULAR | Status: DC | PRN
  Administered 2019-06-16 – 2019-06-17 (×6): 1 mg via INTRAVENOUS
  Filled 2019-06-16 (×6): qty 1

## 2019-06-16 MED ORDER — OXYCODONE-ACETAMINOPHEN 5-325 MG PO TABS: 1.0000 | ORAL_TABLET | ORAL | Status: DC | PRN

## 2019-06-16 NOTE — Progress Notes (Signed)
2 Days Post-Op   Subjective/Chief Complaint: Pt with some bowel fxn NGT pulled out by pt   Objective: Vital signs in last 24 hours: Temp:  [98.4 F (36.9 C)-98.8 F (37.1 C)] 98.4 F (36.9 C) (06/09 0418) Pulse Rate:  [97-107] 107 (06/09 0418) Resp:  [18] 18 (06/09 0418) BP: (143-159)/(77-121) 144/96 (06/09 0418) SpO2:  [90 %-98 %] 98 % (06/09 0418)    Intake/Output from previous day: 06/08 0701 - 06/09 0700 In: 2564.3 [P.O.:90; I.V.:2374.3; NG/GT:100] Out: 1700 [Urine:1600; Emesis/NG output:100] Intake/Output this shift: No intake/output data recorded.  Constitutional: No acute distress, conversant, appears states age. Eyes: Anicteric sclerae, moist conjunctiva, no lid lag Lungs: Clear to auscultation bilaterally, normal respiratory effort CV: regular rate and rhythm, no murmurs, no peripheral edema, pedal pulses 2+ GI: Soft, no masses or hepatosplenomegaly, inc c/d/i Skin: No rashes, palpation reveals normal turgor Psychiatric: appropriate judgment and insight, oriented to person, place, and time   Lab Results:  Recent Labs    06/15/19 0133  WBC 14.2*  HGB 13.3  HCT 41.2  PLT 356   BMET Recent Labs    06/15/19 0133  NA 140  K 3.7  CL 105  CO2 24  GLUCOSE 116*  BUN 8  CREATININE 0.70  CALCIUM 8.8*   PT/INR No results for input(s): LABPROT, INR in the last 72 hours. ABG No results for input(s): PHART, HCO3 in the last 72 hours.  Invalid input(s): PCO2, PO2  Studies/Results: DG Abd Portable 1V  Result Date: 06/14/2019 CLINICAL DATA:  NG tube placement EXAM: PORTABLE ABDOMEN - 1 VIEW COMPARISON:  None. FINDINGS: NG tube is in the stomach.  Nonobstructive bowel gas pattern. IMPRESSION: NG tube in the stomach. Electronically Signed   By: Rolm Baptise M.D.   On: 06/14/2019 11:02    Anti-infectives: Anti-infectives (From admission, onward)   Start     Dose/Rate Route Frequency Ordered Stop   06/15/19 1100  fluconazole (DIFLUCAN) IVPB 200 mg     200  mg 100 mL/hr over 60 Minutes Intravenous  Once 06/15/19 1008 06/15/19 1420   06/14/19 0600  cefTRIAXone (ROCEPHIN) 2 g in sodium chloride 0.9 % 100 mL IVPB     2 g 200 mL/hr over 30 Minutes Intravenous To ShortStay Surgical 06/11/19 0738 06/14/19 0800   06/14/19 0600  metroNIDAZOLE (FLAGYL) IVPB 500 mg     500 mg 100 mL/hr over 60 Minutes Intravenous To ShortStay Surgical 06/11/19 0738 06/14/19 0825      Assessment/Plan: s/p Procedure(s): ATTEMPTED DIAGNOSTIC LAPAROSCOPY (N/A) LYSIS OF ADHESIONS (N/A) EXPLORATORY LAPAROTOMY (N/A) SMALL BOWEL RESECTION (N/A) Advance diet to clears WTD dressing changes Mobilize Home in next 1-2d  LOS: 2 days    Ralene Ok 06/16/2019

## 2019-06-16 NOTE — TOC Initial Note (Addendum)
Transition of Care [facility]) - Initial/Assessment Note    Patient Details  Name: Emily [name] MRN: [id number] Date of Birth: 1980-02-13  Transition of Care [facility]) CM/SW Contact:    Marilu Favre, RN Phone Number: 06/16/2019, 12:30 PM  Clinical Narrative:                  Confirmed face sheet information with patient at bedside. Patient lives with her father. She has wheel chair, ramp, shower chair, bedside commode and walker.   Active with PCP Dr Huel Cote.   Patient will need to be taught wound care. Will continue to follow.   Barriers to Discharge: Continued Medical Work up   Patient Goals and CMS Choice Patient states their goals for this hospitalization and ongoing recovery are:: to return to home CMS Medicare.gov Compare Post Acute Care list provided to:: Patient Choice offered to / list presented to : Patient  Expected Discharge Plan and Services     Discharge Planning Services: CM Consult   Living arrangements for the past 2 months: [place]                   DME Agency: NA                  Prior Living Arrangements/Services Living arrangements for the past 2 months: Single Family Home Lives with:: Parents(father) Patient language and need for interpreter reviewed:: Yes Do you feel safe going back to the place where you live?: Yes      Need for Family Participation in Patient Care: Yes (Comment) Care giver support system in place?: Yes (comment) Current home services: DME Criminal Activity/Legal Involvement Pertinent to Current Situation/Hospitalization: No - Comment as needed  Activities of Daily Living Home Assistive Devices/Equipment: Cane (specify quad or straight), Wheelchair, Eyeglasses, Blood pressure cuff ADL Screening (condition at time of admission) Patient's cognitive ability adequate to safely complete daily activities?: Yes Is the patient deaf or have difficulty hearing?: No Does the patient have difficulty seeing, even when  wearing glasses/contacts?: No Does the patient have difficulty concentrating, remembering, or making decisions?: No Patient able to express need for assistance with ADLs?: Yes Does the patient have difficulty dressing or bathing?: No Independently performs ADLs?: Yes (appropriate for developmental age) Does the patient have difficulty walking or climbing stairs?: No Weakness of Legs: Both Weakness of Arms/Hands: None  Permission Sought/Granted   Permission granted to share information with : No              Emotional Assessment Appearance:: Appears stated age Attitude/Demeanor/Rapport: Engaged Affect (typically observed): Accepting Orientation: : Oriented to Self, Oriented to Place, Oriented to  Time, Oriented to Situation Alcohol / Substance Use: Not Applicable Psych Involvement: No (comment)  Admission diagnosis:  S/P small bowel resection [Z90.49] Patient Active Problem List   Diagnosis Date Noted  . S/P small bowel resection 06/14/2019  . Infected hernioplasty mesh ([place]) 03/19/2019  . 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 ADHD 02/22/2019  . Anxiety 02/22/2019  . S/P emergency C-section   . MS (multiple sclerosis) ([place])   . Chronic pain   . Panic attack   . Open wound of abdominal wall, anterior, complicated, sequela 82/99/3716  . Postoperative wound infection 09/20/2018  . Acute postoperative pain 08/25/2018  . History of substance use disorder 07/19/2018  . Nausea without vomiting 04/13/2018  . Nausea with vomiting 03/23/2018  . Pharmacologic therapy 03/16/2018  . Disorder of skeletal system 03/16/2018  . Problems influencing health status  03/16/2018  . Abnormal MRI, cervical spine (03/06/17) 03/16/2018  . Morbid obesity with BMI of 45.0-49.9, adult ([place]) 03/16/2018  . Chronic pain of both knees 02/18/2018  . Cervical facet hypertrophy 04/29/2017  . Cervical facet syndrome (Bilateral) (R>L) 04/29/2017  . Spondylosis without myelopathy or radiculopathy, cervical region  04/29/2017  . Spondylosis without myelopathy or radiculopathy, lumbosacral region 03/04/2017  . Cervicogenic headache 02/10/2017  . Occipital headache 02/10/2017  . Trigger point with back pain (Left) 02/10/2017  . History of fainting (vasovagal) 02/10/2017  . History of vasovagal syncope 02/10/2017  . Acute left-sided low back pain without sciatica 02/10/2017  . Neurogenic pain 01/23/2017  . Chronic musculoskeletal pain 12/02/2016  . Cervical radiculitis (Bilateral) 11/12/2016  . Chronic upper back pain (Primary Area of Pain) (Bilateral) (R>L) 11/01/2016  . Chronic hand pain [facility] Area of Pain) (Bilateral) (L>R) 11/01/2016  . Chronic wrist pain [facility] Area of Pain) (Bilateral) (L>R) 11/01/2016  . Chronic foot/toes pain (Fourth Area of Pain) (Bilateral) (R>L) 11/01/2016  . Chronic upper extremity pain (Secondary Area of Pain) (Bilateral) (L>R) 11/01/2016  . Long term prescription benzodiazepine use 11/01/2016  . 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 Cervical foraminal stenosis (Bilateral) (C6-7) 11/01/2016  . 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 DDD (degenerative disc disease), thoracic 11/01/2016  . DDD (degenerative disc disease), lumbar 11/01/2016  . Lumbar facet arthropathy 11/01/2016  . Lumbar facet syndrome (Bilateral) (R>L) 11/01/2016  . Vitamin D deficiency 10/28/2016  . Opiate use 07/23/2016  . Chronic neck pain (Primary Area of Pain) (Bilateral) (R>L) 07/23/2016  . Chronic low back pain (Secondary Area of Pain) (Bilateral) (L>R) 07/23/2016  . Chronic knee pain (Fifth Area of Pain) (Left) 07/23/2016  . Midline thoracic back pain 07/23/2016  . Chronic lower extremity pain (Bilateral) 07/23/2016  . Incarcerated incisional hernia 02/16/2016  . Incisional hernia 02/15/2016  . Carpal tunnel syndrome [facility] Area of Pain) (Bilateral) (L>R) 11/13/2015  . Substance abuse ([place]) 09/07/2015  . Paresthesia 08/02/2015  . Chronic pain  syndrome 08/02/2015  . Postpartum endometritis 07/21/2015  . PTSD (post-traumatic stress disorder) 05/16/2015  . Severe episode of recurrent major depressive disorder, without psychotic features ([place]) 05/16/2015  . GAD (generalized anxiety disorder) 05/16/2015  . Difficult intravenous access 05/08/2015  . Right optic neuritis 04/27/2015  . Conversion disorder with attacks or seizures, persistent, with psychological stressor 03/28/2015  . Migraine with aura and without status migrainosus, not intractable 03/28/2015  . Generalized anxiety disorder 03/28/2015  . Conversion disorder 03/28/2015  . HTN (hypertension) 02/27/2015  . Perforated bowel ([place]) 02/27/2015  . Depression 12/27/2014  . Multiple sclerosis ([place]) 12/27/2014  . Migraine 12/27/2014  . Seizure disorder ([place]) 12/27/2014  . Smoker 12/27/2014  . Obesity 12/27/2014  . DDD (degenerative disc disease), cervical 07/28/2014  . Major depressive disorder, single episode 11/02/2013  . Laryngopharyngeal reflux 10/25/2010  . Tonsillitis 10/25/2010   PCP:  Leeanne Rio, MD Pharmacy:   [id number] - [place], [place] [address] [place] [address] Phone: [phone] Fax: [phone]  [place], [place] [address] [place] [address] Phone: [phone] Fax: [phone]     Social Determinants of Health (SDOH) Interventions    Readmission Risk Interventions No flowsheet data found.

## 2019-06-17 MED ORDER — OXYCODONE HCL 5 MG PO TABS: 5.0000 mg | ORAL_TABLET | Freq: Four times a day (QID) | ORAL | 0 refills | Status: DC | PRN

## 2019-06-17 NOTE — Discharge Instructions (Signed)
[facility] [phone]  OPEN ABDOMINAL SURGERY: POST OP INSTRUCTIONS  Always review your discharge instruction sheet given to you by the facility where your surgery was performed.  IF YOU HAVE DISABILITY OR FAMILY LEAVE FORMS, YOU MUST BRING THEM TO THE OFFICE FOR PROCESSING.  PLEASE DO NOT GIVE THEM TO YOUR DOCTOR.  1. A prescription for pain medication may be given to you upon discharge.  Take your pain medication as prescribed, if needed.  If narcotic pain medicine is not needed, then you may take acetaminophen (Tylenol) or ibuprofen (Advil) as needed. 2. Take your usually prescribed medications unless otherwise directed. 3. If you need a refill on your pain medication, please contact your pharmacy. They will contact our office to request authorization.  Prescriptions will not be filled after 5pm or on week-ends. 4. You should follow a light diet the first few days after arrival home, such as soup and crackers, pudding, etc.unless your doctor has advised otherwise. A high-fiber, low fat diet can be resumed as tolerated.   Be sure to include lots of fluids daily. Most patients will experience some swelling and bruising on the chest and neck area.  Ice packs will help.  Swelling and bruising can take several days to resolve 5. Most patients will experience some swelling and bruising in the area of the incision. Ice pack will help. Swelling and bruising can take several days to resolve..  6. It is common to experience some constipation if taking pain medication after surgery.  Increasing fluid intake and taking a stool softener will usually help or prevent this problem from occurring.  A mild laxative (Milk of Magnesia or Miralax) should be taken according to package directions if there are no bowel movements after 48 hours. 7.  You may have steri-strips (small skin tapes) in place directly over the incision.  These strips should be left on the skin for 7-10 days.  If your  surgeon used skin glue on the incision, you may shower in 24 hours.  The glue will flake off over the next 2-3 weeks.  Any sutures or staples will be removed at the office during your follow-up visit. You may find that a light gauze bandage over your incision may keep your staples from being rubbed or pulled. You may shower and replace the bandage daily. 8. ACTIVITIES:  You may resume regular (light) daily activities beginning the next day--such as daily self-care, walking, climbing stairs--gradually increasing activities as tolerated.  You may have sexual intercourse when it is comfortable.  Refrain from any heavy lifting or straining until approved by your doctor. a. You may drive when you no longer are taking prescription pain medication, you can comfortably wear a seatbelt, and you can safely maneuver your car and apply brakes b. Return to Work: ___________________________________ 9. You should see your doctor in the office for a follow-up appointment approximately two weeks after your surgery.  Make sure that you call for this appointment within a day or two after you arrive home to insure a convenient appointment time. OTHER INSTRUCTIONS:  _____________________________________________________________ _____________________________________________________________  WHEN TO CALL YOUR DOCTOR: 1. Fever over 101.0 2. Inability to urinate 3. Nausea and/or vomiting 4. Extreme swelling or bruising 5. Continued bleeding from incision. 6. Increased pain, redness, or drainage from the incision. 7. Difficulty swallowing or breathing 8. Muscle cramping or spasms. 9. Numbness or tingling in hands or feet or around lips.  The clinic staff is available to   answer your questions during regular business hours.  Please don't hesitate to call and ask to speak to one of the nurses if you have concerns.  For further questions, please visit [email or web address]   

## 2019-06-17 NOTE — Discharge Summary (Signed)
Physician Discharge Summary  Patient ID: Emily [name] MRN: [id number] DOB/AGE: April 18, 1980 39 y.o.  Admit date: 06/14/2019 Discharge date: 06/17/2019  Admission Diagnoses: Small bowel fistula  Discharge Diagnoses:  Active Problems:   S/P small bowel resection   Discharged Condition: good  Hospital Course: Patient underwent surgery on 06/14/2019.  Please see operative note for full details. Postoperative patient did well.  She was conservatively managed with NG tube in place postoperatively.  This eventually got pulled out by the patient.  Patient slowly was started on clear liquid diet.  Patient had normal bowel function and was advanced to a regular diet which she tolerated prior to discharge.  Patient had no other acute events.  Patient had expected WBC count postoperatively.  Patient otherwise was ambulating well on her own.  Patient struggled with her MS and other mental health disabilities.  Patient had good pain control prior to discharge.  Patient underwent wet dry dressing changes and education of wet-to-dry dressing changes.  Patient was ultimately deemed stable for discharge and discharged home.  Consults: None  Significant Diagnostic Studies: None  Treatments: surgery: As above  Discharge Exam: Blood pressure (!) 141/87, pulse (!) 107, temperature 98.3 F (36.8 C), temperature source Oral, resp. rate 18, height 5\' 4"  (1.626 m), weight 117.9 kg, last menstrual period 05/11/2019, SpO2 100 %. General appearance: alert and cooperative GI: soft, non-tender; bowel sounds normal; no masses,  no organomegaly and Incision base clean, staples in place.  Disposition: Discharge disposition: 01-Home or Self Care        Allergies as of 06/17/2019      Reactions   Baclofen Hives, Shortness Of Breath   Duloxetine Hcl Shortness Of Breath, Rash   Gabapentin Shortness Of Breath, Rash   Monosodium Glutamate Anaphylaxis   Other Shortness Of Breath, Rash, Other (See Comments)   MSG,  beans (vomiting) MSG causes hives, itching, throat swelling   Acetaminophen Nausea And Vomiting   Projectile vomiting   Alprazolam Other (See Comments)   Lethargy   Amitriptyline Other (See Comments), Hypertension   hypertension   Magnesium Salicylate Hives, Itching   Modafinil Other (See Comments)   Seizures   Rizatriptan Nausea And Vomiting, Other (See Comments)   GI upset, Projectile vomiting GI upset, Projectile vomiting   Tizanidine Hives   Other reaction(s): GI Upset (intolerance)   Vicodin [hydrocodone-acetaminophen] Hives, Nausea And Vomiting, Other (See Comments)   Projectile vomiting   Adhesive [tape] Other (See Comments)   Skin irritation Paper tape is ok   Hydrocodone-acetaminophen Hives, Nausea And Vomiting   Projectile vomiting   Ketorolac Tromethamine Nausea Only   Toradol    Lamotrigine Rash   Nsaids Other (See Comments)   Irritate stomach    Tramadol Nausea And Vomiting   Other reaction(s): GI Upset (intolerance)      Medication List    TAKE these medications   Aimovig 140 MG/ML Soaj Generic drug: Erenumab-aooe Inject 140 mg into the muscle every 30 (thirty) days.   albuterol 108 (90 Base) MCG/ACT inhaler Commonly known as: VENTOLIN HFA Inhale 2 puffs into the lungs every 6 (six) hours as needed for wheezing or shortness of breath. What changed: when to take this   amLODipine 5 MG tablet Commonly known as: NORVASC Take 5 mg by mouth daily.   diphenhydrAMINE 25 MG tablet Commonly known as: BENADRYL Take 50 mg by mouth at bedtime.   divalproex 500 MG DR tablet Commonly known as: Depakote Take 1 tablet (500 mg total) by mouth  at bedtime.   dronabinol 2.5 MG capsule Commonly known as: Marinol Take 1 capsule (2.5 mg total) by mouth 3 (three) times daily.   EpiPen 0.3 mg/0.3 mL Soaj injection Generic drug: EPINEPHrine Inject 0.3 mg into the muscle as needed for anaphylaxis.   etonogestrel 68 MG Impl implant Commonly known as: NEXPLANON 1  each by Subdermal route once.   Gilenya 0.5 MG Caps Generic drug: Fingolimod HCl Take 1 capsule (0.5 mg total) by mouth daily.   loratadine 10 MG tablet Commonly known as: CLARITIN Take 10 mg by mouth daily as needed for allergies.   methocarbamol 750 MG tablet Commonly known as: ROBAXIN Take 1 tablet (750 mg total) by mouth 2 (two) times daily as needed for muscle spasms.   omeprazole 20 MG capsule Commonly known as: PRILOSEC TAKE 1 CAPSULE (20 MG TOTAL) BY MOUTH 2 (TWO) TIMES DAILY BEFORE A MEAL.   OVER THE COUNTER MEDICATION Take 1-2 tablets by mouth 3 (three) times daily as needed (anxiety or pain). CBD Gummies   oxyCODONE 5 MG immediate release tablet Commonly known as: Oxy IR/ROXICODONE Take 1 tablet (5 mg total) by mouth every 6 (six) hours as needed for breakthrough pain.   potassium chloride SA 20 MEQ tablet Commonly known as: KLOR-CON Take 1 tablet (20 mEq total) by mouth daily.   pregabalin 225 MG capsule Commonly known as: LYRICA Take 1 capsule (225 mg total) by mouth 2 (two) times daily.   topiramate 100 MG tablet Commonly known as: TOPAMAX Take 1.5 tablets twice a day What changed:   how much to take  how to take this  when to take this   Voltaren 1 % Gel Generic drug: diclofenac Sodium Apply 1 application topically 2 (two) times daily as needed (knee and back pain).       Follow-up Information    [facility] [place]. Go on 06/24/2019.   Specialty: General Surgery Why: 2pm. This is a nurse visit for staple removal. Please arrive 30 minutes early for paperwork. Please bring a copy of your photo ID and insurance card Contact information: [place] [address] [phone]        Ralene Ok, MD. Go on 07/16/2019.   Specialty: General Surgery Why: 320pm. Please arrive 30 minutes earlier for paperwork. Please bring a copy of your photo ID and insurance card Contact information: [place] [address] [phone]               Signed: Ralene Ok 06/17/2019, 2:22 PM

## 2019-06-17 NOTE — Progress Notes (Signed)
3 Days Post-Op   Subjective/Chief Complaint: Pt doing well this AM Some abd pain as expected. Having BMs   Objective: Vital signs in last 24 hours: Temp:  [98.3 F (36.8 C)-98.7 F (37.1 C)] 98.3 F (36.8 C) (06/10 0619) Pulse Rate:  [87-107] 107 (06/10 0619) Resp:  [18] 18 (06/10 0619) BP: (110-141)/(82-101) 141/87 (06/10 0619) SpO2:  [100 %] 100 % (06/10 0619) Last BM Date: 06/16/19  Intake/Output from previous day: 06/09 0701 - 06/10 0700 In: 820 [P.O.:820] Out: -  Intake/Output this shift: No intake/output data recorded.   Constitutional: No acute distress, conversant, appears states age. Eyes: Anicteric sclerae, moist conjunctiva, no lid lag Lungs: Clear to auscultation bilaterally, normal respiratory effort CV: regular rate and rhythm, no murmurs, no peripheral edema, pedal pulses 2+ GI: Soft, no masses or hepatosplenomegaly, non-tender to palpation, inc c/d/i Skin: No rashes, palpation reveals normal turgor Psychiatric: appropriate judgment and insight, oriented to person, place, and time   Lab Results:  Recent Labs    06/15/19 0133  WBC 14.2*  HGB 13.3  HCT 41.2  PLT 356   BMET Recent Labs    06/15/19 0133  NA 140  K 3.7  CL 105  CO2 24  GLUCOSE 116*  BUN 8  CREATININE 0.70  CALCIUM 8.8*   PT/INR No results for input(s): LABPROT, INR in the last 72 hours. ABG No results for input(s): PHART, HCO3 in the last 72 hours.  Invalid input(s): PCO2, PO2  Studies/Results: No results found.  Anti-infectives: Anti-infectives (From admission, onward)   Start     Dose/Rate Route Frequency Ordered Stop   06/15/19 1100  fluconazole (DIFLUCAN) IVPB 200 mg        200 mg 100 mL/hr over 60 Minutes Intravenous  Once 06/15/19 1008 06/15/19 1420   06/14/19 0600  cefTRIAXone (ROCEPHIN) 2 g in sodium chloride 0.9 % 100 mL IVPB       "And" Linked Group Details   2 g 200 mL/hr over 30 Minutes Intravenous To ShortStay Surgical 06/11/19 0738 06/14/19 0800    06/14/19 0600  metroNIDAZOLE (FLAGYL) IVPB 500 mg       "And" Linked Group Details   500 mg 100 mL/hr over 60 Minutes Intravenous To ShortStay Surgical 06/11/19 0738 06/14/19 0825      Assessment/Plan: s/p Procedure(s): ATTEMPTED DIAGNOSTIC LAPAROSCOPY (N/A) LYSIS OF ADHESIONS (N/A) EXPLORATORY LAPAROTOMY (N/A) SMALL BOWEL RESECTION (N/A) Advance diet to fld and adat Mobilize Education for dressing changes Home later today if tol PO  LOS: 3 days    Ralene Ok 06/17/2019

## 2019-06-17 NOTE — Discharge Planning (Signed)
Patient discharged home in stable condition. Verbalizes understanding of all discharge instructions, including home medications and follow up appointments. 

## 2019-06-28 ENCOUNTER — Encounter: Payer: Self-pay | Admitting: Internal Medicine

## 2019-08-02 ENCOUNTER — Telehealth: Payer: Self-pay | Admitting: Pain Medicine

## 2019-08-02 ENCOUNTER — Telehealth: Payer: Self-pay | Admitting: Internal Medicine

## 2019-08-02 NOTE — Telephone Encounter (Signed)
Patient states she is not able to have GES because it is 4 hours long and she can't get a babysitter for that long. She was advised she was not able to take child back with her during the test. Patient asking further recs.

## 2019-08-02 NOTE — Telephone Encounter (Signed)
Pt called stating that Dr Delane Ginger told her to call in 3 weeks and let him know how she is doing on the new Lyrica dose. She states that her back is much better but her hands and feet are the same. She has an appt on 9/15 to evaluate new Lyrica adjustment.

## 2019-08-02 NOTE — Telephone Encounter (Signed)
Patient called and said that she can not have the gastric emptying study done because it is 4 hours long and she does not have a Public librarian for that.  Could possibly be able to do that in September when and if her daughter goes to preschool    [phone]  Please call

## 2019-08-03 NOTE — Telephone Encounter (Signed)
Called pt and made aware. She has the # to call to schedule.

## 2019-08-03 NOTE — Telephone Encounter (Signed)
If able to do it in September, please arrange then.

## 2019-08-23 ENCOUNTER — Other Ambulatory Visit: Payer: Self-pay

## 2019-08-23 ENCOUNTER — Ambulatory Visit: Payer: Medicare Other | Attending: Pain Medicine | Admitting: Pain Medicine

## 2019-08-23 ENCOUNTER — Encounter: Payer: Self-pay | Admitting: Pain Medicine

## 2019-08-23 VITALS — BP 148/112 | HR 90 | Temp 97.3°F | Resp 16 | Ht 64.0 in | Wt 260.0 lb

## 2019-08-23 DIAGNOSIS — M5441 Lumbago with sciatica, right side: Secondary | ICD-10-CM

## 2019-08-23 DIAGNOSIS — M5442 Lumbago with sciatica, left side: Secondary | ICD-10-CM | POA: Diagnosis present

## 2019-08-23 DIAGNOSIS — M47816 Spondylosis without myelopathy or radiculopathy, lumbar region: Secondary | ICD-10-CM | POA: Diagnosis present

## 2019-08-23 DIAGNOSIS — M5136 Other intervertebral disc degeneration, lumbar region: Secondary | ICD-10-CM | POA: Diagnosis present

## 2019-08-23 DIAGNOSIS — G894 Chronic pain syndrome: Secondary | ICD-10-CM | POA: Diagnosis present

## 2019-08-23 DIAGNOSIS — G8929 Other chronic pain: Secondary | ICD-10-CM | POA: Diagnosis present

## 2019-08-23 NOTE — Progress Notes (Signed)
PROVIDER NOTE: Information contained herein reflects review and annotations entered in association with encounter. Interpretation of such information and data should be left to medically-trained personnel. Information provided to patient can be located elsewhere in the medical record under "Patient Instructions". Document created using STT-dictation technology, any transcriptional errors that may result from process are unintentional.    Patient: Emily [name]  Service Category: E/M  Provider: Gaspar Cola, MD  DOB: 1980/10/11  DOS: 08/23/2019  Specialty: Interventional Pain Management  MRN: [id number]  Setting: Ambulatory outpatient  PCP: Leeanne Rio, MD  Type: Established Patient    Referring Provider: Leeanne Rio, MD  Location: Office  Delivery: Face-to-face     HPI  Reason for encounter: [name], a 39 y.o. year old female, is here today for evaluation and management of her Chronic pain syndrome [G89.4]. [name] primary complain today is Back Pain (lower) Last encounter: Practice (08/02/2019). My last encounter with her was on 08/02/2019. Pertinent problems: [name] has Multiple sclerosis (Emily [name]); Migraine; Migraine with aura and without status migrainosus, not intractable; Paresthesia; Chronic pain syndrome; Carpal tunnel syndrome (Tertiary Area of Pain) (Bilateral) (L>R); DDD (degenerative disc disease), cervical; Chronic neck pain (Primary Area of Pain) (Bilateral) (R>L); Chronic low back pain (Secondary Area of Pain) (Bilateral) (L>R); Chronic knee pain (Fifth Area of Pain) (Left); Midline thoracic back pain; Chronic lower extremity pain (Bilateral); Chronic upper back pain (Primary Area of Pain) (Bilateral) (R>L); Chronic hand pain (Tertiary Area of Pain) (Bilateral) (L>R); Chronic wrist pain (Tertiary Area of Pain) (Bilateral) (L>R); Chronic foot/toes pain (Fourth Area of Pain) (Bilateral) (R>L); Chronic upper extremity pain (Secondary Area of Pain) (Bilateral)  (L>R); 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); Cervical foraminal stenosis (Bilateral) (C6-7); Chronic CNS demyelinating disease (MS); DDD (degenerative disc disease), thoracic; DDD (degenerative disc disease), lumbar; Lumbar facet arthropathy; Lumbar facet syndrome (Bilateral) (R>L); Cervical radiculitis (Bilateral); Chronic musculoskeletal pain; Neurogenic pain; Cervicogenic headache; Occipital headache; Trigger point with back pain (Left); Acute left-sided low back pain without sciatica; Spondylosis without myelopathy or radiculopathy, lumbosacral region; Cervical facet hypertrophy; Cervical facet syndrome (Bilateral) (R>L); Spondylosis without myelopathy or radiculopathy, cervical region; Chronic pain of both knees; Abnormal MRI, cervical spine (03/06/17); and Acute postoperative pain on their pertinent problem list. Pain Assessment: Severity of Chronic pain is reported as a 8 /10. Location: Back Lower/denies. Onset: More than a month ago. Quality: Aching. Timing: Constant. Modifying factor(s): stretching, medication. Vitals:  height is 5' 4"  (1.626 m) and weight is 260 lb (117.9 kg). Her temporal temperature is 97.3 F (36.3 C) (abnormal). Her blood pressure is 148/112 (abnormal) and her pulse is 90. Her respiration is 16 and oxygen saturation is 100%.   The patient comes into the clinic today indicating that she is doing much better with regards to her abdominal surgeries.  The wound is closing slowly by second intention.  She indicated that they found that she had a bowel leakage and this is the reason why it had gotten infected.  In any case, she is doing much better from this and she is wanting for [place] to do some injections in her lower back.  She is referring to the lumbar facet blocks.  Today she states that you will realize how much those injections where helping until you are unable to get them.  Today she indicates wanting for [place] to schedule a bilateral lumbar facet block.  She still has  a BMI of 44 and needs to work on bringing  this down. On 03/16/2018 we had referred her to bariatric surgery and medical weight management.   Pharmacotherapy Assessment   Analgesic: None from our practice due to benzodiazepine use and high risk. The patient is also taking Marinol.   Monitoring: [place] PMP: PDMP reviewed during this encounter.       Pharmacotherapy: No side-effects or adverse reactions reported. Compliance: No problems identified. Effectiveness: Clinically acceptable.  No notes on file  UDS:  Summary  Date Value Ref Range Status  03/16/2018 FINAL  Final    Comment:    ==================================================================== [facility] COMP DRUG ANALYSIS,UR ==================================================================== Test                             Result       Flag       Units Drug Present and Declared for Prescription Verification   Desmethyldiazepam              214          EXPECTED   ng/mg creat   Oxazepam                       1241         EXPECTED   ng/mg creat   Temazepam                      1052         EXPECTED   ng/mg creat    Desmethyldiazepam, oxazepam, and temazepam are expected    metabolites of diazepam. Desmethyldiazepam and oxazepam are also    expected metabolites of other drugs, including chlordiazepoxide,    prazepam, clorazepate, and halazepam. Oxazepam is an expected    metabolite of temazepam. Oxazepam and temazepam are also    available as scheduled prescription medications.   Pregabalin                     PRESENT      EXPECTED   Topiramate                     PRESENT      EXPECTED   Methocarbamol                  PRESENT      EXPECTED   Fluoxetine                     PRESENT      EXPECTED   Norfluoxetine                  PRESENT      EXPECTED    Norfluoxetine is an expected metabolite of fluoxetine. Drug Absent but Declared for Prescription Verification   Prochlorperazine               Not Detected UNEXPECTED   Ibuprofen                       Not Detected UNEXPECTED    Ibuprofen, as indicated in the declared medication list, is not    always detected even when used as directed. ==================================================================== Test                      Result    Flag   Units      Ref Range   Creatinine  29               mg/dL      >=20 ==================================================================== Declared Medications:  The flagging and interpretation on this report are based on the  following declared medications.  Unexpected results may arise from  inaccuracies in the declared medications.  **Note: The testing scope of this panel includes these medications:  Diazepam (Valium)  Fluoxetine (Prozac)  Methocarbamol (Robaxin)  Pregabalin (Lyrica)  Prochlorperazine (Compazine)  Topiramate (Topamax)  **Note: The testing scope of this panel does not include small to  moderate amounts of these reported medications:  Ibuprofen  **Note: The testing scope of this panel does not include following  reported medications:  Albuterol  Amlodipine (Norvasc)  Buspirone (BuSpar)  Divalproex (Depakote)  Epinephrine (EpiPen)  Etonogestrel (Nexplanon)  Fingolimod (Gilenya)  Lithium (Lithobid)  Loratadine (Claritin)  Multivitamin  Ranitidine (Zantac) ==================================================================== For clinical consultation, please call [phone]. ====================================================================      ROS  Constitutional: Denies any fever or chills Gastrointestinal: No reported hemesis, hematochezia, vomiting, or acute GI distress Musculoskeletal: Denies any acute onset joint swelling, redness, loss of ROM, or weakness Neurological: No reported episodes of acute onset apraxia, aphasia, dysarthria, agnosia, amnesia, paralysis, loss of coordination, or loss of consciousness  Medication Review  EPINEPHrine, Erenumab-aooe, Fingolimod  HCl, albuterol, amLODipine, diclofenac Sodium, diphenhydrAMINE, divalproex, dronabinol, loratadine, methocarbamol, omeprazole, potassium chloride SA, pregabalin, topiramate, and traZODone  History Review  Allergy: Ms. Schmuck is allergic to baclofen, duloxetine hcl, gabapentin, monosodium glutamate, other, acetaminophen, alprazolam, amitriptyline, magnesium salicylate, modafinil, rizatriptan, tizanidine, vicodin [hydrocodone-acetaminophen], adhesive [tape], hydrocodone-acetaminophen, ketorolac tromethamine, lamotrigine, nsaids, and tramadol. Drug: Ms. Cerritos  reports no history of drug use. Alcohol:  reports no history of alcohol use. Tobacco:  reports that she quit smoking about 4 years ago. Her smoking use included cigarettes. She has a 2.50 pack-year smoking history. She has never used smokeless tobacco. Social: Ms. Traynor  reports that she quit smoking about 4 years ago. Her smoking use included cigarettes. She has a 2.50 pack-year smoking history. She has never used smokeless tobacco. She reports that she does not drink alcohol and does not use drugs. Medical:  has a past medical history of ADHD, Anxiety, Asthma (as a child), Chronic back pain, Chronic pain, Conversion disorder, DDD (degenerative disc disease), cervical, Depression, External hemorrhoid, Fatty liver, Fibromyalgia, GERD (gastroesophageal reflux disease), History of kidney stones, HTN (hypertension) (02/27/2015), JC virus antibody positive, Migraines, MS (multiple sclerosis) ([place]), Neuromuscular disorder ([place]), Neuropathy, OA (osteoarthritis), Ovarian cyst, Panic attack, Perforated bowel ([place]) (2009), Pneumonia, Pseudoseizures, PTSD (post-traumatic stress disorder), PTSD (post-traumatic stress disorder), S/P emergency C-section, Seasonal allergies, Urinary urgency, Vertigo, Wears contact lenses, and Wears glasses. Surgical: Ms. Cunanan  has a past surgical history that includes Extremity cyst excision (1994); Bowel resection (01/2007); Colostomy  closure (04/2007); Scar revision (01/21/2011); Hernia repair; Abdominal surgery; Appendectomy; Cesarean section (N/A, 07/12/2015); Incisional hernia repair (N/A, 02/16/2016); Insertion of mesh (02/16/2016); Tooth Extraction (Left, 10/2016); Colonoscopy with propofol (N/A, 01/29/2017); Radiology with anesthesia (N/A, 03/06/2017); Exam under anesthesia with hemorrhoidectomy (N/A, 06/11/2018); Excisional hemorrhoidectomy; Transrectal drainage of pelvic abscess; Radiology with anesthesia (N/A, 09/15/2018); Radiology with anesthesia (N/A, 10/13/2018); Radiology with anesthesia (N/A, 10/26/2018); Colon surgery; ABDOMINAL WOUND EXPLORATION  (03/19/2019); laparotomy (03/19/2019); laparoscopy (N/A, 06/14/2019); Lysis of adhesion (N/A, 06/14/2019); laparotomy (N/A, 06/14/2019); and Bowel resection (N/A, 06/14/2019). Family: family history includes Arthritis in her father; Cancer in her maternal grandfather and maternal grandmother; Diabetes in her father and mother; Hypertension in her father and mother.  Laboratory Chemistry  Profile   Renal Lab Results  Component Value Date   BUN 8 06/15/2019   CREATININE 0.70 06/15/2019   LABCREA 292.00 07/12/2015   BCR 12 08/13/2018   GFRAA >60 06/15/2019   GFRNONAA >60 06/15/2019     Hepatic Lab Results  Component Value Date   AST 18 06/07/2019   ALT 20 06/07/2019   ALBUMIN 3.8 06/07/2019   ALKPHOS 82 06/07/2019   LIPASE 41 07/12/2015     Electrolytes Lab Results  Component Value Date   NA 140 06/15/2019   K 3.7 06/15/2019   CL 105 06/15/2019   CALCIUM 8.8 (L) 06/15/2019   MG 2.2 03/16/2018     Bone Lab Results  Component Value Date   25OHVITD1 20 (L) 03/16/2018   25OHVITD2 4.7 03/16/2018   25OHVITD3 15 03/16/2018     Inflammation (CRP: Acute Phase) (ESR: Chronic Phase) Lab Results  Component Value Date   CRP 6 03/16/2018   ESRSEDRATE 20 03/16/2018   LATICACIDVEN 1.0 11/12/2018       Note: Above Lab results reviewed.  Recent Imaging Review  DG Abd Portable  1V CLINICAL DATA:  NG tube placement  EXAM: PORTABLE ABDOMEN - 1 VIEW  COMPARISON:  None.  FINDINGS: NG tube is in the stomach.  Nonobstructive bowel gas pattern.  IMPRESSION: NG tube in the stomach.  Electronically Signed   By: Rolm Baptise M.D.   On: 06/14/2019 11:02 Note: Reviewed        Physical Exam  General appearance: Well nourished, well developed, and well hydrated. In no apparent acute distress Mental status: Alert, oriented x 3 (person, place, & time)       Respiratory: No evidence of acute respiratory distress Eyes: PERLA Vitals: BP (!) 148/112 (BP Location: Left Arm, Patient Position: Sitting, Cuff Size: Large)   Pulse 90   Temp (!) 97.3 F (36.3 C) (Temporal)   Resp 16   Ht '5\' 4"'$  (1.626 m)   Wt 260 lb (117.9 kg)   SpO2 100%   BMI 44.63 kg/m  BMI: Estimated body mass index is 44.63 kg/m as calculated from the following:   Height as of this encounter: '5\' 4"'$  (1.626 m).   Weight as of this encounter: 260 lb (117.9 kg). Ideal: Ideal body weight: 54.7 kg (120 lb 9.5 oz) Adjusted ideal body weight: 80 kg (176 lb 5.7 oz)  Assessment   Status Diagnosis  Controlled Controlled Controlled 1. Chronic pain syndrome   2. Chronic low back pain (Secondary Area of Pain) (Bilateral) (L>R)   3. Lumbar facet syndrome (Bilateral) (R>L)   4. Lumbar facet arthropathy   5. DDD (degenerative disc disease), lumbar   6. Severe obesity (BMI >= 40) (HCC)      Updated Problems: Problem  Severe Obesity (Bmi >= 40) (Hcc)    Plan of Care  Problem-specific:  No problem-specific Assessment & Plan notes found for this encounter.  Ms. Devona Holmes Boulet has a current medication list which includes the following long-term medication(s): albuterol, divalproex, gilenya, methocarbamol, omeprazole, potassium chloride sa, pregabalin, topiramate, and trazodone.  Pharmacotherapy (Medications Ordered): No orders of the defined types were placed in this encounter.  Orders:  Orders  Placed This Encounter  Procedures  . LUMBAR FACET(MEDIAL BRANCH NERVE BLOCK) MBNB    Standing Status:   Future    Standing Expiration Date:   09/23/2019    Scheduling Instructions:     Procedure: Lumbar facet block (AKA.: Lumbosacral medial branch nerve block)     Side:  Bilateral     Level: L3-4, L4-5, & L5-S1 Facets (L2, L3, L4, L5, & S1 Medial Branch Nerves)     Sedation: Patient's choice.     Timeframe: ASAA    Order Specific Question:   Where will this procedure be performed?    Answer:   [facility]   Follow-up plan:   Return for Procedure (w/ sedation): (B) L-FCT BLK #3.      Interventional management options: Considering:   NOTE: Hold until complete closure and resolution of abdominal wound infection.  Diagnosticmidline thoracic ESI Diagnosticbilateral thoracic facet block  Possiblebilateral thoracic RFA Diagnostic right sided LESI Therapeutic bilateral lumbar facet RFA #1  Diagnosticleft IA knee injection Diagnosticleft knee genicular NB Possibleleft genicular nerve RFA  Diagnosticright-sided CESI Diagnosticright-sided cervical facet block Diagnosticright-sided cervical facet RFA Diagnosticright occipital NB   Palliative PRN treatment(s):   NOTE: No procedures will be scheduled until the patient fully heals from her infected abdominal wound. Palliative bilateral lumbar facet block #3     Recent Visits Date Type Provider Dept  06/02/19 Telemedicine Milinda Pointer, MD [facility]  Showing recent visits within past 90 days and meeting all other requirements Today's Visits Date Type Provider Dept  08/23/19 Office Visit Milinda Pointer, MD [facility]  Showing today's visits and meeting all other requirements Future Appointments Date Type Provider Dept  09/02/19 Appointment Milinda Pointer, MD [facility]  09/22/19 Appointment Milinda Pointer, MD [facility]  Showing future appointments  within next 90 days and meeting all other requirements  I discussed the assessment and treatment plan with the patient. The patient was provided an opportunity to ask questions and all were answered. The patient agreed with the plan and demonstrated an understanding of the instructions.  Patient advised to call back or seek an in-person evaluation if the symptoms or condition worsens.  Duration of encounter: 30 minutes.  Note by: Gaspar Cola, MD Date: 08/23/2019; Time: 11:58 AM

## 2019-08-23 NOTE — Patient Instructions (Addendum)
______________________________________________________________________________________________  Weight Management Required  URGENT: Your weight has been found to be adversely affecting your health.  Dear Emily [name]:  Your current Estimated body mass index is 44.63 kg/m as calculated from the following:   Height as of 06/14/19: 5\' 4"  (1.626 m).   Weight as of 06/14/19: 260 lb (117.9 kg).  Please use the table below to identify your weight category and associated incidence of chronic pain, secondary to your weight.  Body Mass Index (BMI) Classification BMI level (kg/m2) Category Associated incidence of chronic pain  <18  Underweight   18.5-24.9 Ideal body weight   25-29.9 Overweight  20%  30-34.9 Obese (Class I)  68%  35-39.9 Severe obesity (Class II)  136%  >40 Extreme obesity (Class III)  254%   In addition: You will be considered "Morbidly Obese", if your BMI is above 30 and you have one or more of the following conditions which are known to be caused and/or directly associated with obesity: 1.    Type 2 Diabetes (Which in turn can lead to cardiovascular diseases (CVD), stroke, peripheral vascular diseases (PVD), retinopathy, nephropathy, and neuropathy) 2.    Cardiovascular Disease (High Blood Pressure; Congestive Heart Failure; High Cholesterol; Coronary Artery Disease; Angina; or History of Heart Attacks) 3.    Breathing problems (Asthma; obesity-hypoventilation syndrome; obstructive sleep apnea; chronic inflammatory airway disease; reactive airway disease; or shortness of breath) 4.    Chronic kidney disease 5.    Liver disease (nonalcoholic fatty liver disease) 6.    High blood pressure 7.    Acid reflux (gastroesophageal reflux disease; heartburn) 8.    Osteoarthritis (OA) (with any of the following: hip pain; knee pain; and/or low back pain) 9.    Low back pain (Lumbar Facet Syndrome; and/or Degenerative Disc Disease) 10.  Hip pain (Osteoarthritis of hip) (For every 1 lbs of  added body weight, there is a 2 lbs increase in pressure inside of each hip articulation. 1:2 mechanical relationship) 11.  Knee pain (Osteoarthritis of knee) (For every 1 lbs of added body weight, there is a 4 lbs increase in pressure inside of each knee articulation. 1:4 mechanical relationship) (patients with a BMI>30 kg/m2 were 6.8 times more likely to develop knee OA than normal-weight individuals) 12.  Cancer: Epidemiological studies have shown that obesity is a risk factor for: post-menopausal breast cancer; cancers of the endometrium, colon and kidney cancer; malignant adenomas of the oesophagus. Obese subjects have an approximately 1.5-3.5-fold increased risk of developing these cancers compared with normal-weight subjects, and it has been estimated that between 15 and 45% of these cancers can be attributed to overweight. More recent studies suggest that obesity may also increase the risk of other types of cancer, including pancreatic, hepatic and gallbladder cancer. (Ref: Obesity and cancer. Pischon T, Nthlings U, [employer] H. Proc Nutr Soc. 2008 May;67(2):128-45. doi: 10.1017/S0029665108006976.) The International Agency for Research on Cancer (IARC) has identified 13 cancers associated with overweight and obesity: meningioma, multiple myeloma, adenocarcinoma of the esophagus, and cancers of the thyroid, postmenopausal breast cancer, gallbladder, stomach, liver, pancreas, kidney, ovaries, uterus, colon and rectal (colorectal) cancers. 55 percent of all cancers diagnosed in women and 24 percent of those diagnosed in men are associated with overweight and obesity.  Recommendation: At this point it is urgent that you take a step back and concentrate in loosing weight. Dedicate 100% of your efforts on this task. Nothing else will improve your health more than bringing your weight down and your BMI to less  than 30. If you are here, you probably have chronic pain. Because most chronic pain patients have  difficulty exercising secondary to their pain, you must rely on proper nutrition and diet in order to lose the weight. If your BMI is above 40, you should seriously consider bariatric surgery. A realistic goal is to lose 10% of your body weight over a period of 12 months.  Be honest to yourself, if over time you have unsuccessfully tried to lose weight, then it is time for you to seek professional help and to enter a medically supervised weight management program, and/or undergo bariatric surgery. Stop procrastinating.   Pain management considerations:  1.    Pharmacological Problems: Be advised that the use of opioid analgesics (oxycodone; hydrocodone; morphine; methadone; codeine; and all of their derivatives) have been associated with decreased metabolism and weight gain.  For this reason, should we see that you are unable to lose weight while taking these medications, it may become necessary for [place] to taper down and indefinitely discontinue them.  2.    Technical Problems: The incidence of successful interventional therapies decreases as the patient's BMI increases. It is much more difficult to accomplish a safe and effective interventional therapy on a patient with a BMI above 35. 3.    Radiation Exposure Problems: The x-rays machine, used to accomplish injection therapies, will automatically increase their x-ray output in order to capture an appropriate bone image. This means that radiation exposure increases exponentially with the patient's BMI. (The higher the BMI, the higher the radiation exposure.) Although the level of radiation used at a given time is still safe to the patient, it is not for the physician and/or assisting staff. Unfortunately, radiation exposure is accumulative. Because physicians and the staff have to do procedures and be exposed on a daily basis, this can result in health problems such as cancer and radiation burns. Radiation exposure to the staff is monitored by the radiation  batches that they wear. The exposure levels are reported back to the staff on a quarterly basis. Depending on levels of exposure, physicians and staff may be obligated by law to decrease this exposure. This means that they have the right and obligation to refuse providing therapies where they may be overexposed to radiation. For this reason, physicians may decline to offer therapies such as radiofrequency ablation or implants to patients with a BMI above 40. 4.    Current Trends: Be advised that the current trend is to no longer offer certain therapies to patients with a BMI equal to, or above 35, due to increase perioperative risks, increased technical procedural difficulties, and excessive radiation exposure to healthcare personnel.  ______________________________________________________________________________________________    ____________________________________________________________________________________________  Preparing for Procedure with Sedation  Procedure appointments are limited to planned procedures: . No Prescription Refills. . No disability issues will be discussed. . No medication changes will be discussed.  Instructions: . Oral Intake: Do not eat or drink anything for at least 8 hours prior to your procedure. (Exception: Blood Pressure Medication. See below.) . Transportation: Unless otherwise stated by your physician, you may drive yourself after the procedure. . Blood Pressure Medicine: Do not forget to take your blood pressure medicine with a sip of water the morning of the procedure. If your Diastolic (lower reading)is above 100 mmHg, elective cases will be cancelled/rescheduled. . Blood thinners: These will need to be stopped for procedures. Notify our staff if you are taking any blood thinners. Depending on which one you take, there will  be specific instructions on how and when to stop it. . Diabetics on insulin: Notify the staff so that you can be scheduled 1st case  in the morning. If your diabetes requires high dose insulin, take only  of your normal insulin dose the morning of the procedure and notify the staff that you have done so. . Preventing infections: Shower with an antibacterial soap the morning of your procedure. . Build-up your immune system: Take 1000 mg of Vitamin C with every meal (3 times a day) the day prior to your procedure. Marland Kitchen Antibiotics: Inform the staff if you have a condition or reason that requires you to take antibiotics before dental procedures. . Pregnancy: If you are pregnant, call and cancel the procedure. . Sickness: If you have a cold, fever, or any active infections, call and cancel the procedure. . Arrival: You must be in the facility at least 30 minutes prior to your scheduled procedure. . Children: Do not bring children with you. . Dress appropriately: Bring dark clothing that you would not mind if they get stained. . Valuables: Do not bring any jewelry or valuables.  Reasons to call and reschedule or cancel your procedure: (Following these recommendations will minimize the risk of a serious complication.) . Surgeries: Avoid having procedures within 2 weeks of any surgery. (Avoid for 2 weeks before or after any surgery). . Flu Shots: Avoid having procedures within 2 weeks of a flu shots or . (Avoid for 2 weeks before or after immunizations). . Barium: Avoid having a procedure within 7-10 days after having had a radiological study involving the use of radiological contrast. (Myelograms, Barium swallow or enema study). . Heart attacks: Avoid any elective procedures or surgeries for the initial 6 months after a "Myocardial Infarction" (Heart Attack). . Blood thinners: It is imperative that you stop these medications before procedures. Let us know if you if you take any blood thinner.  . Infection: Avoid procedures during or within two weeks of an infection (including chest colds or gastrointestinal problems). Symptoms associated  with infections include: Localized redness, fever, chills, night sweats or profuse sweating, burning sensation when voiding, cough, congestion, stuffiness, runny nose, sore throat, diarrhea, nausea, vomiting, cold or Flu symptoms, recent or current infections. It is specially important if the infection is over the area that we intend to treat. Marland Kitchen Heart and lung problems: Symptoms that may suggest an active cardiopulmonary problem include: cough, chest pain, breathing difficulties or shortness of breath, dizziness, ankle swelling, uncontrolled high or unusually low blood pressure, and/or palpitations. If you are experiencing any of these symptoms, cancel your procedure and contact your primary care physician for an evaluation.  Remember:  Regular Business hours are:  Monday to Thursday 8:00 AM to 4:00 PM  Provider's Schedule: Milinda Pointer, MD:  Procedure days: Tuesday and Thursday 7:30 AM to 4:00 PM  Gillis Santa, MD:  Procedure days: Monday and Wednesday 7:30 AM to 4:00 PM ____________________________________________________________________________________________   GENERAL RISKS AND COMPLICATIONS  What are the risk, side effects and possible complications? Generally speaking, most procedures are safe.  However, with any procedure there are risks, side effects, and the possibility of complications.  The risks and complications are dependent upon the sites that are lesioned, or the type of nerve block to be performed.  The closer the procedure is to the spine, the more serious the risks are.  Great care is taken when placing the radio frequency needles, block needles or lesioning probes, but sometimes complications can occur. 1.  Infection: Any time there is an injection through the skin, there is a risk of infection.  This is why sterile conditions are used for these blocks.  There are four possible types of infection. 1. Localized skin infection. 2. Central Nervous System Infection-This can  be in the form of Meningitis, which can be deadly. 3. Epidural Infections-This can be in the form of an epidural abscess, which can cause pressure inside of the spine, causing compression of the spinal cord with subsequent paralysis. This would require an emergency surgery to decompress, and there are no guarantees that the patient would recover from the paralysis. 4. Discitis-This is an infection of the intervertebral discs.  It occurs in about 1% of discography procedures.  It is difficult to treat and it may lead to surgery.        2. Pain: the needles have to go through skin and soft tissues, will cause soreness.       3. Damage to internal structures:  The nerves to be lesioned may be near blood vessels or    other nerves which can be potentially damaged.       4. Bleeding: Bleeding is more common if the patient is taking blood thinners such as  aspirin, Coumadin, Ticiid, Plavix, etc., or if he/she have some genetic predisposition  such as hemophilia. Bleeding into the spinal canal can cause compression of the spinal  cord with subsequent paralysis.  This would require an emergency surgery to  decompress and there are no guarantees that the patient would recover from the  paralysis.       5. Pneumothorax:  Puncturing of a lung is a possibility, every time a needle is introduced in  the area of the chest or upper back.  Pneumothorax refers to free air around the  collapsed lung(s), inside of the thoracic cavity (chest cavity).  Another two possible  complications related to a similar event would include: Hemothorax and Chylothorax.   These are variations of the Pneumothorax, where instead of air around the collapsed  lung(s), you may have blood or chyle, respectively.       6. Spinal headaches: They may occur with any procedures in the area of the spine.       7. Persistent CSF (Cerebro-Spinal Fluid) leakage: This is a rare problem, but may occur  with prolonged intrathecal or epidural catheters either  due to the formation of a fistulous  track or a dural tear.       8. Nerve damage: By working so close to the spinal cord, there is always a possibility of  nerve damage, which could be as serious as a permanent spinal cord injury with  paralysis.       9. Death:  Although rare, severe deadly allergic reactions known as "Anaphylactic  reaction" can occur to any of the medications used.      10. Worsening of the symptoms:  We can always make thing worse.  What are the chances of something like this happening? Chances of any of this occuring are extremely low.  By statistics, you have more of a chance of getting killed in a motor vehicle accident: while driving to the hospital than any of the above occurring .  Nevertheless, you should be aware that they are possibilities.  In general, it is similar to taking a shower.  Everybody knows that you can slip, hit your head and get killed.  Does that mean that you should not shower again?  Nevertheless  always keep in mind that statistics do not mean anything if you happen to be on the wrong side of them.  Even if a procedure has a 1 (one) in a 1,000,000 (million) chance of going wrong, it you happen to be that one..Also, keep in mind that by statistics, you have more of a chance of having something go wrong when taking medications.  Who should not have this procedure? If you are on a blood thinning medication (e.g. Coumadin, Plavix, see list of "Blood Thinners"), or if you have an active infection going on, you should not have the procedure.  If you are taking any blood thinners, please inform your physician.  How should I prepare for this procedure?  Do not eat or drink anything at least six hours prior to the procedure.  Bring a driver with you .  It cannot be a taxi.  Come accompanied by an adult that can drive you back, and that is strong enough to help you if your legs get weak or numb from the local anesthetic.  Take all of your medicines the  morning of the procedure with just enough water to swallow them.  If you have diabetes, make sure that you are scheduled to have your procedure done first thing in the morning, whenever possible.  If you have diabetes, take only half of your insulin dose and notify our nurse that you have done so as soon as you arrive at the clinic.  If you are diabetic, but only take blood sugar pills (oral hypoglycemic), then do not take them on the morning of your procedure.  You may take them after you have had the procedure.  Do not take aspirin or any aspirin-containing medications, at least eleven (11) days prior to the procedure.  They may prolong bleeding.  Wear loose fitting clothing that may be easy to take off and that you would not mind if it got stained with Betadine or blood.  Do not wear any jewelry or perfume  Remove any nail coloring.  It will interfere with some of our monitoring equipment.  NOTE: Remember that this is not meant to be interpreted as a complete list of all possible complications.  Unforeseen problems may occur.  BLOOD THINNERS The following drugs contain aspirin or other products, which can cause increased bleeding during surgery and should not be taken for 2 weeks prior to and 1 week after surgery.  If you should need take something for relief of minor pain, you may take acetaminophen which is found in Tylenol,m Datril, Anacin-3 and Panadol. It is not blood thinner. The products listed below are.  Do not take any of the products listed below in addition to any listed on your instruction sheet.  A.P.C or A.P.C with Codeine Codeine Phosphate Capsules #3 Ibuprofen Ridaura  ABC compound Congesprin Imuran rimadil  Advil Cope Indocin Robaxisal  Alka-Seltzer Effervescent Pain Reliever and Antacid Coricidin or Coricidin-D  Indomethacin Rufen  Alka-Seltzer plus Cold Medicine Cosprin Ketoprofen S-A-C Tablets  Anacin Analgesic Tablets or Capsules Coumadin Korlgesic Salflex  Anacin  Extra Strength Analgesic tablets or capsules CP-2 Tablets Lanoril Salicylate  Anaprox Cuprimine Capsules Levenox Salocol  Anexsia-D Dalteparin Magan Salsalate  Anodynos Darvon compound Magnesium Salicylate Sine-off  Ansaid Dasin Capsules Magsal Sodium Salicylate  Anturane Depen Capsules Marnal Soma  APF Arthritis pain formula Dewitt's Pills Measurin Stanback  Argesic Dia-Gesic Meclofenamic Sulfinpyrazone  Arthritis Bayer Timed Release Aspirin Diclofenac Meclomen Sulindac  Arthritis pain formula Anacin Dicumarol Medipren Supac  Analgesic (Safety coated) Arthralgen Diffunasal Mefanamic Suprofen  Arthritis Strength Bufferin Dihydrocodeine Mepro Compound Suprol  Arthropan liquid Dopirydamole Methcarbomol with Aspirin Synalgos  ASA tablets/Enseals Disalcid Micrainin Tagament  Ascriptin Doan's Midol Talwin  Ascriptin A/D Dolene Mobidin Tanderil  Ascriptin Extra Strength Dolobid Moblgesic Ticlid  Ascriptin with Codeine Doloprin or Doloprin with Codeine Momentum Tolectin  Asperbuf Duoprin Mono-gesic Trendar  Aspergum Duradyne Motrin or Motrin IB Triminicin  Aspirin plain, buffered or enteric coated Durasal Myochrisine Trigesic  Aspirin Suppositories Easprin Nalfon Trillsate  Aspirin with Codeine Ecotrin Regular or Extra Strength Naprosyn Uracel  Atromid-S Efficin Naproxen Ursinus  Auranofin Capsules Elmiron Neocylate Vanquish  Axotal Emagrin Norgesic Verin  Azathioprine Empirin or Empirin with Codeine Normiflo Vitamin E  Azolid Emprazil Nuprin Voltaren  Bayer Aspirin plain, buffered or children's or timed BC Tablets or powders Encaprin Orgaran Warfarin Sodium  Buff-a-Comp Enoxaparin Orudis Zorpin  Buff-a-Comp with Codeine Equegesic Os-Cal-Gesic   Buffaprin Excedrin plain, buffered or Extra Strength Oxalid   Bufferin Arthritis Strength Feldene Oxphenbutazone   Bufferin plain or Extra Strength Feldene Capsules Oxycodone with Aspirin   Bufferin with Codeine Fenoprofen Fenoprofen Pabalate or  Pabalate-SF   Buffets II Flogesic Panagesic   Buffinol plain or Extra Strength Florinal or Florinal with Codeine Panwarfarin   Buf-Tabs Flurbiprofen Penicillamine   Butalbital Compound Four-way cold tablets Penicillin   Butazolidin Fragmin Pepto-Bismol   Carbenicillin Geminisyn Percodan   Carna Arthritis Reliever Geopen Persantine   Carprofen Gold's salt Persistin   Chloramphenicol Goody's Phenylbutazone   Chloromycetin Haltrain Piroxlcam   Clmetidine heparin Plaquenil   Cllnoril Hyco-pap Ponstel   Clofibrate Hydroxy chloroquine Propoxyphen         Before stopping any of these medications, be sure to consult the physician who ordered them.  Some, such as Coumadin (Warfarin) are ordered to prevent or treat serious conditions such as "deep thrombosis", "pumonary embolisms", and other heart problems.  The amount of time that you may need off of the medication may also vary with the medication and the reason for which you were taking it.  If you are taking any of these medications, please make sure you notify your pain physician before you undergo any procedures.         Pain Management Discharge Instructions  General Discharge Instructions :  If you need to reach your doctor call: Monday-Friday 8:00 am - 4:00 pm at [phone] or toll free [phone].  After clinic hours [phone] to have operator reach doctor.  Bring all of your medication bottles to all your appointments in the pain clinic.  To cancel or reschedule your appointment with Pain Management please remember to call 24 hours in advance to avoid a fee.  Refer to the educational materials which you have been given on: General Risks, I had my Procedure. Discharge Instructions, Post Sedation.  Post Procedure Instructions:  The drugs you were given will stay in your system until tomorrow, so for the next 24 hours you should not drive, make any legal decisions or drink any alcoholic beverages.  You may eat anything  you prefer, but it is better to start with liquids then soups and crackers, and gradually work up to solid foods.  Please notify your doctor immediately if you have any unusual bleeding, trouble breathing or pain that is not related to your normal pain.  Depending on the type of procedure that was done, some parts of your body may feel week and/or numb.  This usually clears up by tonight or the next day.  Walk with the use of an assistive device or accompanied by an adult for the 24 hours.  You may use ice on the affected area for the first 24 hours.  Put ice in a Ziploc bag and cover with a towel and place against area 15 minutes on 15 minutes off.  You may switch to heat after 24 hours.Facet Blocks Patient Information  Description: The facets are joints in the spine between the vertebrae.  Like any joints in the body, facets can become irritated and painful.  Arthritis can also effect the facets.  By injecting steroids and local anesthetic in and around these joints, we can temporarily block the nerve supply to them.  Steroids act directly on irritated nerves and tissues to reduce selling and inflammation which often leads to decreased pain.  Facet blocks may be done anywhere along the spine from the neck to the low back depending upon the location of your pain.   After numbing the skin with local anesthetic (like Novocaine), a small needle is passed onto the facet joints under x-ray guidance.  You may experience a sensation of pressure while this is being done.  The entire block usually lasts about 15-25 minutes.   Conditions which may be treated by facet blocks:   Low back/buttock pain  Neck/shoulder pain  Certain types of headaches  Preparation for the injection:  1. Do not eat any solid food or dairy products within 8 hours of your appointment. 2. You may drink clear liquid up to 3 hours before appointment.  Clear liquids include water, black coffee, juice or soda.  No milk or cream  please. 3. You may take your regular medication, including pain medications, with a sip of water before your appointment.  Diabetics should hold regular insulin (if taken separately) and take 1/2 normal NPH dose the morning of the procedure.  Carry some sugar containing items with you to your appointment. 4. A driver must accompany you and be prepared to drive you home after your procedure. 5. Bring all your current medications with you. 6. An IV may be inserted and sedation may be given at the discretion of the physician. 7. A blood pressure cuff, EKG and other monitors will often be applied during the procedure.  Some patients may need to have extra oxygen administered for a short period. 8. You will be asked to provide medical information, including your allergies and medications, prior to the procedure.  We must know immediately if you are taking blood thinners (like Coumadin/Warfarin) or if you are allergic to IV iodine contrast (dye).  We must know if you could possible be pregnant.  Possible side-effects:   Bleeding from needle site  Infection (rare, may require surgery)  Nerve injury (rare)  Numbness & tingling (temporary)  Difficulty urinating (rare, temporary)  Spinal headache (a headache worse with upright posture)  Light-headedness (temporary)  Pain at injection site (serveral days)  Decreased blood pressure (rare, temporary)  Weakness in arm/leg (temporary)  Pressure sensation in back/neck (temporary)   Call if you experience:   Fever/chills associated with headache or increased back/neck pain  Headache worsened by an upright position  New onset, weakness or numbness of an extremity below the injection site  Hives or difficulty breathing (go to the emergency room)  Inflammation or drainage at the injection site(s)  Severe back/neck pain greater than usual  New symptoms which are concerning to you  Please note:  Although the local anesthetic injected  can often make your  back or neck feel good for several hours after the injection, the pain will likely return. It takes 3-7 days for steroids to work.  You may not notice any pain relief for at least one week.  If effective, we will often do a series of 2-3 injections spaced 3-6 weeks apart to maximally decrease your pain.  After the initial series, you may be a candidate for a more permanent nerve block of the facets.  If you have any questions, please call #[phone] [facility]

## 2019-08-31 ENCOUNTER — Other Ambulatory Visit: Payer: Self-pay

## 2019-08-31 ENCOUNTER — Ambulatory Visit
Admission: RE | Admit: 2019-08-31 | Discharge: 2019-08-31 | Disposition: A | Discharge status: Home / Self Care | Payer: Medicare Other | Source: Ambulatory Visit | Attending: Pain Medicine | Admitting: Pain Medicine

## 2019-08-31 ENCOUNTER — Encounter: Payer: Self-pay | Admitting: Pain Medicine

## 2019-08-31 ENCOUNTER — Ambulatory Visit (HOSPITAL_BASED_OUTPATIENT_CLINIC_OR_DEPARTMENT_OTHER): Payer: Medicare Other | Admitting: Pain Medicine

## 2019-08-31 VITALS — BP 155/97 | HR 92 | Temp 97.9°F | Resp 16 | Ht 64.0 in | Wt 260.0 lb

## 2019-08-31 DIAGNOSIS — R55 Syncope and collapse: Secondary | ICD-10-CM

## 2019-08-31 DIAGNOSIS — M5136 Other intervertebral disc degeneration, lumbar region: Secondary | ICD-10-CM

## 2019-08-31 DIAGNOSIS — Z6841 Body Mass Index (BMI) 40.0 and over, adult: Secondary | ICD-10-CM | POA: Insufficient documentation

## 2019-08-31 DIAGNOSIS — M5442 Lumbago with sciatica, left side: Secondary | ICD-10-CM | POA: Diagnosis present

## 2019-08-31 DIAGNOSIS — M47817 Spondylosis without myelopathy or radiculopathy, lumbosacral region: Secondary | ICD-10-CM | POA: Diagnosis present

## 2019-08-31 DIAGNOSIS — Z9189 Other specified personal risk factors, not elsewhere classified: Secondary | ICD-10-CM | POA: Diagnosis present

## 2019-08-31 DIAGNOSIS — M47816 Spondylosis without myelopathy or radiculopathy, lumbar region: Secondary | ICD-10-CM | POA: Insufficient documentation

## 2019-08-31 DIAGNOSIS — M7918 Myalgia, other site: Secondary | ICD-10-CM

## 2019-08-31 DIAGNOSIS — G8929 Other chronic pain: Secondary | ICD-10-CM | POA: Diagnosis present

## 2019-08-31 DIAGNOSIS — M5441 Lumbago with sciatica, right side: Secondary | ICD-10-CM | POA: Diagnosis present

## 2019-08-31 MED ORDER — GLYCOPYRROLATE 0.2 MG/ML IJ SOLN
INTRAMUSCULAR | Status: AC
  Filled 2019-08-31: qty 1

## 2019-08-31 MED ORDER — DIPHENHYDRAMINE HCL 50 MG/ML IJ SOLN
INTRAMUSCULAR | Status: AC
  Filled 2019-08-31: qty 1

## 2019-08-31 MED ORDER — DIPHENHYDRAMINE HCL 50 MG/ML IJ SOLN
25.0000 mg | INTRAMUSCULAR | Status: DC | PRN
  Administered 2019-08-31: 25 mg via INTRAVENOUS

## 2019-08-31 MED ORDER — FENTANYL CITRATE (PF) 100 MCG/2ML IJ SOLN
INTRAMUSCULAR | Status: AC
  Filled 2019-08-31: qty 2

## 2019-08-31 MED ORDER — FENTANYL CITRATE (PF) 100 MCG/2ML IJ SOLN
25.0000 ug | INTRAMUSCULAR | Status: AC | PRN
  Administered 2019-08-31 (×2): 50 ug via INTRAVENOUS

## 2019-08-31 MED ORDER — ROPIVACAINE HCL 2 MG/ML IJ SOLN
18.0000 mL | Freq: Once | INTRAMUSCULAR | Status: AC
  Administered 2019-08-31: 18 mL via PERINEURAL

## 2019-08-31 MED ORDER — MIDAZOLAM HCL 5 MG/5ML IJ SOLN
INTRAMUSCULAR | Status: AC
  Filled 2019-08-31: qty 5

## 2019-08-31 MED ORDER — TRIAMCINOLONE ACETONIDE 40 MG/ML IJ SUSP
INTRAMUSCULAR | Status: AC
  Filled 2019-08-31: qty 1

## 2019-08-31 MED ORDER — MIDAZOLAM HCL 5 MG/5ML IJ SOLN
1.0000 mg | INTRAMUSCULAR | Status: AC | PRN
  Administered 2019-08-31 (×4): 1 mg via INTRAVENOUS

## 2019-08-31 MED ORDER — GLYCOPYRROLATE 0.2 MG/ML IJ SOLN
0.2000 mg | Freq: Once | INTRAMUSCULAR | Status: AC
  Administered 2019-08-31: 0.2 mg via INTRAVENOUS

## 2019-08-31 MED ORDER — TRIAMCINOLONE ACETONIDE 40 MG/ML IJ SUSP
80.0000 mg | Freq: Once | INTRAMUSCULAR | Status: AC
  Administered 2019-08-31: 80 mg

## 2019-08-31 MED ORDER — LIDOCAINE HCL 2 % IJ SOLN
20.0000 mL | Freq: Once | INTRAMUSCULAR | Status: AC
  Administered 2019-08-31: 200 mg

## 2019-08-31 MED ORDER — LACTATED RINGERS IV SOLN
1000.0000 mL | Freq: Once | INTRAVENOUS | Status: AC
  Administered 2019-08-31: 1000 mL via INTRAVENOUS

## 2019-08-31 MED ORDER — LIDOCAINE HCL 2 % IJ SOLN
INTRAMUSCULAR | Status: AC
  Filled 2019-08-31: qty 10

## 2019-08-31 MED ORDER — METHOCARBAMOL 750 MG PO TABS: 750.0000 mg | ORAL_TABLET | Freq: Two times a day (BID) | ORAL | 2 refills | Status: DC | PRN

## 2019-08-31 NOTE — Progress Notes (Signed)
PROVIDER NOTE: Information contained herein reflects review and annotations entered in association with encounter. Interpretation of such information and data should be left to medically-trained personnel. Information provided to patient can be located elsewhere in the medical record under "Patient Instructions". Document created using STT-dictation technology, any transcriptional errors that may result from process are unintentional.    Patient: Emily [name]  Service Category: Procedure  Provider: Gaspar Cola, MD  DOB: [birth year]  DOS: 08/31/2019  Location: [facility]  MRN: [id number]  Setting: Ambulatory - outpatient  Referring Provider: Leeanne Rio, MD  Type: Established Patient  Specialty: Interventional Pain Management  PCP: Leeanne Rio, MD   Primary Reason for Visit: Interventional Pain Management Treatment. CC: Back Pain (lower)  Procedure:          Anesthesia, Analgesia, Anxiolysis:  Type: Lumbar Facet, Medial Branch Block(s) #3  Primary Purpose: Therapeutic Region: Posterolateral Lumbosacral Spine Level: L2, L3, L4, L5, & S1 Medial Branch Level(s). Injecting these levels blocks the L3-4, L4-5, and L5-S1 lumbar facet joints. Laterality: Bilateral  Type: Moderate (Conscious) Sedation combined with Local Anesthesia Indication(s): Analgesia and Anxiety Route: Intravenous (IV) IV Access: Secured Sedation: Meaningful verbal contact was maintained at all times during the procedure  Local Anesthetic: Lidocaine 1-2%  Position: Prone   Indications: 1. Spondylosis without myelopathy or radiculopathy, lumbosacral region   2. Lumbar facet syndrome (Bilateral) (R>L)   3. Lumbar facet arthropathy   4. DDD (degenerative disc disease), lumbar   5. Chronic low back pain (Secondary Area of Pain) (Bilateral) (L>R)   6. History of fainting (vasovagal)   7. History of vasovagal syncope   8. Morbid obesity with BMI of 45.0-49.9, adult (HCC)    Pain  Score: Pre-procedure: 9 /10 Post-procedure: 7 /10   Pre-op Assessment:  [name] is a 39 y.o. (year old), female patient, seen today for interventional treatment. 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); Scar revision (01/21/2011); Hernia repair; Abdominal surgery; Appendectomy; Cesarean section (N/A, 07/12/2015); Incisional hernia repair (N/A, 02/16/2016); Insertion of mesh (02/16/2016); Tooth Extraction (Left, 10/2016); Colonoscopy with propofol (N/A, 01/29/2017); Radiology with anesthesia (N/A, 03/06/2017); Exam under anesthesia with hemorrhoidectomy (N/A, 06/11/2018); Excisional hemorrhoidectomy; Transrectal drainage of pelvic abscess; Radiology with anesthesia (N/A, 09/15/2018); Radiology with anesthesia (N/A, 10/13/2018); Radiology with anesthesia (N/A, 10/26/2018); Colon surgery; ABDOMINAL WOUND EXPLORATION  (03/19/2019); laparotomy (03/19/2019); laparoscopy (N/A, 06/14/2019); Lysis of adhesion (N/A, 06/14/2019); laparotomy (N/A, 06/14/2019); and Bowel resection (N/A, 06/14/2019). [name] has a current medication list which includes the following prescription(s): aimovig, albuterol, amlodipine, diclofenac sodium, diphenhydramine, divalproex, dronabinol, epinephrine, gilenya, loratadine, methocarbamol, omeprazole, potassium chloride sa, pregabalin, topiramate, trazodone, and [START ON 09/26/2019] methocarbamol, and the following Facility-Administered Medications: diphenhydramine and metoclopramide. Her primarily concern today is the Back Pain (lower)  Initial Vital Signs:  Pulse/HCG Rate: 89ECG Heart Rate: (!) 108 Temp: 97.6 F (36.4 C) Resp: 18 BP: (!) 154/93 SpO2: 99 %  BMI: Estimated body mass index is 44.63 kg/m as calculated from the following:   Height as of this encounter: 5\' 4"  (1.626 m).   Weight as of this encounter: 260 lb (117.9 kg).  Risk Assessment: Allergies: Reviewed. She is allergic to baclofen,  duloxetine hcl, gabapentin, monosodium glutamate, other, acetaminophen, alprazolam, amitriptyline, magnesium salicylate, modafinil, rizatriptan, tizanidine, vicodin [hydrocodone-acetaminophen], adhesive [tape], hydrocodone-acetaminophen, ketorolac tromethamine, lamotrigine, nsaids, and tramadol.  Allergy Precautions: None required Coagulopathies: Reviewed. None identified.  Blood-thinner therapy: None at this time Active  Infection(s): Reviewed. None identified. [name] is afebrile  Site Confirmation: Ms. Abate was asked to confirm the procedure and laterality before marking the site Procedure checklist: Completed Consent: Before the procedure and under the influence of no sedative(s), amnesic(s), or anxiolytics, the patient was informed of the treatment options, risks and possible complications. To fulfill our ethical and legal obligations, as recommended by the American Medical Association's Code of Ethics, I have informed the patient of my clinical impression; the nature and purpose of the treatment or procedure; the risks, benefits, and possible complications of the intervention; the alternatives, including doing nothing; the risk(s) and benefit(s) of the alternative treatment(s) or procedure(s); and the risk(s) and benefit(s) of doing nothing. The patient was provided information about the general risks and possible complications associated with the procedure. These may include, but are not limited to: failure to achieve desired goals, infection, bleeding, organ or nerve damage, allergic reactions, paralysis, and death. In addition, the patient was informed of those risks and complications associated to Spine-related procedures, such as failure to decrease pain; infection (i.e.: Meningitis, epidural or intraspinal abscess); bleeding (i.e.: epidural hematoma, subarachnoid hemorrhage, or any other type of intraspinal or peri-dural bleeding); organ or nerve damage (i.e.: Any type of peripheral nerve,  nerve root, or spinal cord injury) with subsequent damage to sensory, motor, and/or autonomic systems, resulting in permanent pain, numbness, and/or weakness of one or several areas of the body; allergic reactions; (i.e.: anaphylactic reaction); and/or death. Furthermore, the patient was informed of those risks and complications associated with the medications. These include, but are not limited to: allergic reactions (i.e.: anaphylactic or anaphylactoid reaction(s)); adrenal axis suppression; blood sugar elevation that in diabetics may result in ketoacidosis or comma; water retention that in patients with history of congestive heart failure may result in shortness of breath, pulmonary edema, and decompensation with resultant heart failure; weight gain; swelling or edema; medication-induced neural toxicity; particulate matter embolism and blood vessel occlusion with resultant organ, and/or nervous system infarction; and/or aseptic necrosis of one or more joints. Finally, the patient was informed that Medicine is not an exact science; therefore, there is also the possibility of unforeseen or unpredictable risks and/or possible complications that may result in a catastrophic outcome. The patient indicated having understood very clearly. We have given the patient no guarantees and we have made no promises. Enough time was given to the patient to ask questions, all of which were answered to the patient's satisfaction. Ms. Hinchman has indicated that she wanted to continue with the procedure. Attestation: I, the ordering provider, attest that I have discussed with the patient the benefits, risks, side-effects, alternatives, likelihood of achieving goals, and potential problems during recovery for the procedure that I have provided informed consent. Date   Time: 08/31/2019  7:56 AM  Pre-Procedure Preparation:  Monitoring: As per clinic protocol. Respiration, ETCO2, SpO2, BP, heart rate and rhythm monitor placed and  checked for adequate function Safety Precautions: Patient was assessed for positional comfort and pressure points before starting the procedure. Time-out: I initiated and conducted the "Time-out" before starting the procedure, as per protocol. The patient was asked to participate by confirming the accuracy of the "Time Out" information. Verification of the correct person, site, and procedure were performed and confirmed by me, the nursing staff, and the patient. "Time-out" conducted as per Joint Commission's Universal Protocol (UP.01.01.01). Time: 7654  Description of Procedure:          Laterality: Bilateral. The procedure was performed in identical fashion  on both sides. Levels:  L2, L3, L4, L5, & S1 Medial Branch Level(s) Area Prepped: Posterior Lumbosacral Region DuraPrep (Iodine Povacrylex [0.7% available iodine] and Isopropyl Alcohol, 74% w/w) Safety Precautions: Aspiration looking for blood return was conducted prior to all injections. At no point did we inject any substances, as a needle was being advanced. Before injecting, the patient was told to immediately notify me if she was experiencing any new onset of "ringing in the ears, or metallic taste in the mouth". No attempts were made at seeking any paresthesias. Safe injection practices and needle disposal techniques used. Medications properly checked for expiration dates. SDV (single dose vial) medications used. After the completion of the procedure, all disposable equipment used was discarded in the proper designated medical waste containers. Local Anesthesia: Protocol guidelines were followed. The patient was positioned over the fluoroscopy table. The area was prepped in the usual manner. The time-out was completed. The target area was identified using fluoroscopy. A 12-in long, straight, sterile hemostat was used with fluoroscopic guidance to locate the targets for each level blocked. Once located, the skin was marked with an approved  surgical skin marker. Once all sites were marked, the skin (epidermis, dermis, and hypodermis), as well as deeper tissues (fat, connective tissue and muscle) were infiltrated with a small amount of a short-acting local anesthetic, loaded on a 10cc syringe with a 25G, 1.5-in  Needle. An appropriate amount of time was allowed for local anesthetics to take effect before proceeding to the next step. Local Anesthetic: Lidocaine 2.0% The unused portion of the local anesthetic was discarded in the proper designated containers. Technical explanation of process:  L2 Medial Branch Nerve Block (MBB): The target area for the L2 medial branch is at the junction of the postero-lateral aspect of the superior articular process and the superior, posterior, and medial edge of the transverse process of L3. Under fluoroscopic guidance, a Quincke needle was inserted until contact was made with os over the superior postero-lateral aspect of the pedicular shadow (target area). After negative aspiration for blood, 0.5 mL of the nerve block solution was injected without difficulty or complication. The needle was removed intact. L3 Medial Branch Nerve Block (MBB): The target area for the L3 medial branch is at the junction of the postero-lateral aspect of the superior articular process and the superior, posterior, and medial edge of the transverse process of L4. Under fluoroscopic guidance, a Quincke needle was inserted until contact was made with os over the superior postero-lateral aspect of the pedicular shadow (target area). After negative aspiration for blood, 0.5 mL of the nerve block solution was injected without difficulty or complication. The needle was removed intact. L4 Medial Branch Nerve Block (MBB): The target area for the L4 medial branch is at the junction of the postero-lateral aspect of the superior articular process and the superior, posterior, and medial edge of the transverse process of L5. Under fluoroscopic  guidance, a Quincke needle was inserted until contact was made with os over the superior postero-lateral aspect of the pedicular shadow (target area). After negative aspiration for blood, 0.5 mL of the nerve block solution was injected without difficulty or complication. The needle was removed intact. L5 Medial Branch Nerve Block (MBB): The target area for the L5 medial branch is at the junction of the postero-lateral aspect of the superior articular process and the superior, posterior, and medial edge of the sacral ala. Under fluoroscopic guidance, a Quincke needle was inserted until contact was made with os over  the superior postero-lateral aspect of the pedicular shadow (target area). After negative aspiration for blood, 0.5 mL of the nerve block solution was injected without difficulty or complication. The needle was removed intact. S1 Medial Branch Nerve Block (MBB): The target area for the S1 medial branch is at the posterior and inferior 6 o'clock position of the L5-S1 facet joint. Under fluoroscopic guidance, the Quincke needle inserted for the L5 MBB was redirected until contact was made with os over the inferior and postero aspect of the sacrum, at the 6 o' clock position under the L5-S1 facet joint (Target area). After negative aspiration for blood, 0.5 mL of the nerve block solution was injected without difficulty or complication. The needle was removed intact.  Nerve block solution: 0.2% PF-Ropivacaine + Triamcinolone (40 mg/mL) diluted to a final concentration of 4 mg of Triamcinolone/mL of Ropivacaine The unused portion of the solution was discarded in the proper designated containers. Procedural Needles: 22-gauge, 7-inch, Quincke needles used for all levels.  Once the entire procedure was completed, the treated area was cleaned, making sure to leave some of the prepping solution back to take advantage of its long term bactericidal properties.   Illustration of the posterior view of the  lumbar spine and the posterior neural structures. Laminae of L2 through S1 are labeled. DPRL5, dorsal primary ramus of L5; DPRS1, dorsal primary ramus of S1; DPR3, dorsal primary ramus of L3; FJ, facet (zygapophyseal) joint L3-L4; I, inferior articular process of L4; LB1, lateral branch of dorsal primary ramus of L1; IAB, inferior articular branches from L3 medial branch (supplies L4-L5 facet joint); IBP, intermediate branch plexus; MB3, medial branch of dorsal primary ramus of L3; NR3, third lumbar nerve root; S, superior articular process of L5; SAB, superior articular branches from L4 (supplies L4-5 facet joint also); TP3, transverse process of L3.  Vitals:   08/31/19 0905 08/31/19 0919 08/31/19 0929 08/31/19 0939  BP: (!) 166/116 (!) 138/48 104/73 (!) 155/97  Pulse: 92     Resp: 14 19 16 16   Temp:  98.4 F (36.9 C)  97.9 F (36.6 C)  TempSrc:  Temporal  Temporal  SpO2: 100% 97% 100% 100%  Weight:      Height:         Start Time: 0857 hrs. End Time: 0909 hrs.  Imaging Guidance (Spinal):          Type of Imaging Technique: Fluoroscopy Guidance (Spinal) Indication(s): Assistance in needle guidance and placement for procedures requiring needle placement in or near specific anatomical locations not easily accessible without such assistance. Exposure Time: Please see nurses notes. Contrast: None used. Fluoroscopic Guidance: I was personally present during the use of fluoroscopy. "Tunnel Vision Technique" used to obtain the best possible view of the target area. Parallax error corrected before commencing the procedure. "Direction-depth-direction" technique used to introduce the needle under continuous pulsed fluoroscopy. Once target was reached, antero-posterior, oblique, and lateral fluoroscopic projection used confirm needle placement in all planes. Images permanently stored in EMR. Interpretation: No contrast injected. I personally interpreted the imaging intraoperatively. Adequate needle  placement confirmed in multiple planes. Permanent images saved into the patient's record.  Antibiotic Prophylaxis:   Anti-infectives (From admission, onward)   None     Indication(s): None identified  Post-operative Assessment:  Post-procedure Vital Signs:  Pulse/HCG Rate: 9294 Temp: 97.9 F (36.6 C) Resp: 16 BP: (!) 155/97 SpO2: 100 %  EBL: None  Complications: No immediate post-treatment complications observed by team, or reported by patient.  Note:  The patient tolerated the entire procedure well. A repeat set of vitals were taken after the procedure and the patient was kept under observation following institutional policy, for this type of procedure. Post-procedural neurological assessment was performed, showing return to baseline, prior to discharge. The patient was provided with post-procedure discharge instructions, including a section on how to identify potential problems. Should any problems arise concerning this procedure, the patient was given instructions to immediately contact us, at any time, without hesitation. In any case, we plan to contact the patient by telephone for a follow-up status report regarding this interventional procedure.  Comments:  No additional relevant information.  Plan of Care  Orders:  Orders Placed This Encounter  Procedures   LUMBAR FACET(MEDIAL BRANCH NERVE BLOCK) MBNB    Scheduling Instructions:     Procedure: Lumbar facet block (AKA.: Lumbosacral medial branch nerve block)     Side: Bilateral     Level: L3-4, L4-5, & L5-S1 Facets (L2, L3, L4, L5, & S1 Medial Branch Nerves)     Sedation: Patient's choice.     Timeframe: Today    Order Specific Question:   Where will this procedure be performed?    Answer:   [facility]   DG [facility] C-ARM 1-60 MIN NO REPORT    Intraoperative interpretation by procedural physician at [place].    Standing Status:   Standing    Number of Occurrences:   1    Order Specific  Question:   Reason for exam:    Answer:   Assistance in needle guidance and placement for procedures requiring needle placement in or near specific anatomical locations not easily accessible without such assistance.   Informed Consent Details: Physician/Practitioner Attestation; Transcribe to consent form and obtain patient signature    Nursing Order: Transcribe to consent form and obtain patient signature. Note: Always confirm laterality of pain with Ms. Saha, before procedure. Procedure: Lumbar Facet Block  under fluoroscopic guidance Indication/Reason: Low Back Pain, with our without leg pain, due to Facet Joint Arthralgia (Joint Pain) known as Lumbar Facet Syndrome, secondary to Lumbar, and/or Lumbosacral Spondylosis (Arthritis of the Spine), without myelopathy or radiculopathy (Nerve Damage). Provider Attestation: I, Maxwell Dossie Arbour, MD, (Pain Management Specialist), the physician/practitioner, attest that I have discussed with the patient the benefits, risks, side effects, alternatives, likelihood of achieving goals and potential problems during recovery for the procedure that I have provided informed consent.   Provide equipment / supplies at bedside    Equipment required: Single use, disposable, "Block Tray"    Standing Status:   Standing    Number of Occurrences:   1    Order Specific Question:   Specify    Answer:   Block Tray   Chronic Opioid Analgesic:  None from our practice due to benzodiazepine use and high risk. The patient is also taking Marinol.   Medications ordered for procedure: Meds ordered this encounter  Medications   lidocaine (XYLOCAINE) 2 % (with pres) injection 400 mg   lactated ringers infusion 1,000 mL   diphenhydrAMINE (BENADRYL) injection 25 mg   midazolam (VERSED) 5 MG/5ML injection 1-2 mg    Make sure Flumazenil is available in the pyxis when using this medication. If oversedation occurs, administer 0.2 mg IV over 15 sec. If after 45 sec no  response, administer 0.2 mg again over 1 min; may repeat at 1 min intervals; not to exceed 4 doses (1 mg)   fentaNYL (SUBLIMAZE) injection 25-50 mcg  Make sure Narcan is available in the pyxis when using this medication. In the event of respiratory depression (RR< 8/min): Titrate NARCAN (naloxone) in increments of 0.1 to 0.2 mg IV at 2-3 minute intervals, until desired degree of reversal.   glycopyrrolate (ROBINUL) injection 0.2 mg   ropivacaine (PF) 2 mg/mL (0.2%) (NAROPIN) injection 18 mL   triamcinolone acetonide (KENALOG-40) injection 80 mg   methocarbamol (ROBAXIN) 750 MG tablet    Sig: Take 1 tablet (750 mg total) by mouth 2 (two) times daily as needed for muscle spasms.    Dispense:  60 tablet    Refill:  2    Fill one day early if pharmacy is closed on scheduled refill date. May substitute for generic if available.   Medications administered: We administered lidocaine, lactated ringers, diphenhydrAMINE, midazolam, fentaNYL, glycopyrrolate, ropivacaine (PF) 2 mg/mL (0.2%), and triamcinolone acetonide.  See the medical record for exact dosing, route, and time of administration.  Follow-up plan:   Return in 2 weeks (on 09/14/2019) for (VV), (PP) Follow-up.       Interventional management options: Considering:   NOTE: Hold until complete closure and resolution of abdominal wound infection.  Diagnosticmidline thoracic ESI Diagnosticbilateral thoracic facet block  Possiblebilateral thoracic RFA Diagnostic right sided LESI Therapeutic bilateral lumbar facet RFA #1  Diagnosticleft IA knee injection Diagnosticleft knee genicular NB Possibleleft genicular nerve RFA  Diagnosticright-sided CESI Diagnosticright-sided cervical facet block Diagnosticright-sided cervical facet RFA Diagnosticright occipital NB   Palliative PRN treatment(s):   NOTE: No procedures will be scheduled until the patient fully heals from her infected abdominal wound. Palliative bilateral  lumbar facet block #3      Recent Visits Date Type Provider Dept  08/31/19 Procedure visit Milinda Pointer, MD [facility]  08/23/19 Office Visit Milinda Pointer, MD [facility]  Showing recent visits within past 90 days and meeting all other requirements Future Appointments Date Type Provider Dept  09/22/19 Appointment Milinda Pointer, MD [facility]  Showing future appointments within next 90 days and meeting all other requirements  Disposition: Discharge home  Discharge (Date   Time): 08/31/2019; 0951 hrs.   Primary Care Physician: Leeanne Rio, MD Location: [facility] Note by: Gaspar Cola, MD Date: 08/31/2019; Time: 8:05 AM  Disclaimer:  Medicine is not an Chief Strategy Officer. The only guarantee in medicine is that nothing is guaranteed. It is important to note that the decision to proceed with this intervention was based on the information collected from the patient. The Data and conclusions were drawn from the patient's questionnaire, the interview, and the physical examination. Because the information was provided in large part by the patient, it cannot be guaranteed that it has not been purposely or unconsciously manipulated. Every effort has been made to obtain as much relevant data as possible for this evaluation. It is important to note that the conclusions that lead to this procedure are derived in large part from the available data. Always take into account that the treatment will also be dependent on availability of resources and existing treatment guidelines, considered by other Pain Management Practitioners as being common knowledge and practice, at the time of the intervention. For Medico-Legal purposes, it is also important to point out that variation in procedural techniques and pharmacological choices are the acceptable norm. The indications, contraindications, technique, and results of the above  procedure should only be interpreted and judged by a Board-Certified Interventional Pain Specialist with extensive familiarity and expertise in the same exact  procedure and technique.

## 2019-08-31 NOTE — Progress Notes (Signed)
Safety precautions to be maintained throughout the outpatient stay will include: orient to surroundings, keep bed in low position, maintain call bell within reach at all times, provide assistance with transfer out of bed and ambulation.  

## 2019-08-31 NOTE — Patient Instructions (Signed)

## 2019-09-01 ENCOUNTER — Telehealth: Payer: Self-pay | Admitting: Neurology

## 2019-09-01 ENCOUNTER — Telehealth: Payer: Self-pay | Admitting: *Deleted

## 2019-09-01 NOTE — Telephone Encounter (Signed)
Pt c/o: seizure Missed medications?  No  Sleep deprived? No (gets 4 hours of sleep) Alcohol intake? No Back to their usual baseline self? Yes  If no, advise go to ER Current medications prescribed by Dr. Delice Lesch:  Depakote Aimovig Topamax Gilenya   Patient states she is seeing a psychiatrists. Her psychiatrist does not prescribe xanax. And that is what patient used to taking and her old psychiatrist used to prescribe it. She stopped prescribing the medication and seeing the other doctor because she plateaued. She states she has a new psychiatrist. The new psychiatrist has prescribed her Trazodone, is helping her sleep and depression. Her anxiety is off the charts. Went to ED on Sunday for anxiety.    She stated while in the ED a nurse told her to check out [facility] and suggested a Spravato. Awaiting a call back from them.   New psychiatrist name Dr Sharon Seller and there therapist name is Dr Pete Pelt.   She states her anxiety is real bad right now and is causing her to breath hard.   Patient states she is doing well from a seizure standpoint her anxiety is what is bothering her and she wants to get it understanding.   She states her family is trying to taker her daughter away because of her seizures.  Patient states she has an appt with her psychologist today at [place].   Patient states she is just making [name] aware of what is going on.

## 2019-09-01 NOTE — Telephone Encounter (Signed)
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 Delice Lesch to be aware of this and she'd also like a call back from a nurse.

## 2019-09-01 NOTE — Telephone Encounter (Signed)
No problems post procedure. 

## 2019-09-01 NOTE — Telephone Encounter (Signed)
Noted, agree with continued f/u with Behavioral health.

## 2019-09-02 ENCOUNTER — Ambulatory Visit: Payer: Medicare Other | Admitting: Pain Medicine

## 2019-09-22 ENCOUNTER — Ambulatory Visit: Payer: Medicare Other | Admitting: Pain Medicine

## 2019-09-22 ENCOUNTER — Telehealth: Payer: Self-pay | Admitting: Neurology

## 2019-09-22 ENCOUNTER — Telehealth: Payer: Self-pay | Admitting: *Deleted

## 2019-09-22 NOTE — Telephone Encounter (Signed)
She called back and wanted to know if Dr. Dossie Arbour had addressed this yet. She is having a flare up of MS symtoms due to getting too hot. Having trouble walking, and having pain.

## 2019-09-22 NOTE — Telephone Encounter (Signed)
Pls let her know that this is called a pseudo-exacerbation of MS symptoms that are well-described to occur in hot temperatures, but MS-related symptoms are temporary and improve once cooled down, steroids are not indicated. This may be more related to pain exacerbation, so agree with speaking to her pain specialist. Thanks
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
## 2019-09-22 NOTE — Telephone Encounter (Signed)
Patient states
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
## 2019-09-22 NOTE — Telephone Encounter (Signed)
Pt called informed that what is going on is pseudo-exacerbation of MS symptoms that are well-described to occur in hot temperatures, but MS-related symptoms are temporary and improve once cooled down, steroids are not indicated. This may be more related to pain exacerbation, so agree with speaking to her pain specialist. Pt verbalized understanding she is going to call her pain clinic MD back
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
## 2019-09-30 ENCOUNTER — Telehealth: Payer: Self-pay

## 2019-09-30 NOTE — Telephone Encounter (Signed)
She has an appt on 10/18 and wants to know if she can switch it to a virtual. Her back is not hurting anymore so she states she only needs to check in about her lyrica.

## 2019-09-30 NOTE — Telephone Encounter (Signed)
Orders from last visit are for VV.

## 2019-10-04 NOTE — Telephone Encounter (Signed)
If Dr. Dossie Arbour ordered face-to-face, then I would not question that.

## 2019-10-14 ENCOUNTER — Other Ambulatory Visit: Payer: Self-pay

## 2019-10-14 ENCOUNTER — Encounter (HOSPITAL_COMMUNITY): Payer: Self-pay

## 2019-10-14 ENCOUNTER — Encounter (HOSPITAL_COMMUNITY)
Admission: RE | Admit: 2019-10-14 | Discharge: 2019-10-14 | Disposition: A | Discharge status: Home / Self Care | Payer: Medicare Other | Source: Ambulatory Visit | Attending: Gastroenterology | Admitting: Gastroenterology

## 2019-10-14 DIAGNOSIS — R110 Nausea: Secondary | ICD-10-CM | POA: Diagnosis present

## 2019-10-14 MED ORDER — TECHNETIUM TC 99M SULFUR COLLOID
2.0000 | Freq: Once | INTRAVENOUS | Status: AC | PRN
  Administered 2019-10-14: 2.2 via ORAL

## 2019-10-23 NOTE — Progress Notes (Signed)
PROVIDER NOTE: Information contained herein reflects review and annotations entered in association with encounter. Interpretation of such information and data should be left to medically-trained personnel. Information provided to patient can be located elsewhere in the medical record under "Patient Instructions". Document created using STT-dictation technology, any transcriptional errors that may result from process are unintentional.    Patient: Emily [name]  Service Category: E/M  Provider: Gaspar Cola, MD  DOB: 1980/05/17  DOS: 10/25/2019  Specialty: Interventional Pain Management  MRN: [id number]  Setting: Ambulatory outpatient  PCP: Emily Rio, MD  Type: Established Patient    Referring Provider: Leeanne Rio, MD  Location: Office  Delivery: Face-to-face     HPI  [name], a 39 y.o. year old female, is here today because of her Chronic pain syndrome [G89.4]. [name] primary complain today is Knee Pain (bilateral, left is worse ) and Peripheral Neuropathy (hands and feet, from MS) Last encounter: My last encounter with her was on 08/31/2019. Pertinent problems: [name] has Multiple sclerosis ([place]); Migraine; Migraine with aura and without status migrainosus, not intractable; Paresthesia; Chronic pain syndrome; Carpal tunnel syndrome (Tertiary Area of Pain) (Bilateral) (L>R); DDD (degenerative disc disease), cervical; Chronic neck pain (Primary Area of Pain) (Bilateral) (R>L); Chronic low back pain (Secondary Area of Pain) (Bilateral) (L>R); Chronic knee pain (Fifth Area of Pain) (Left); Midline thoracic back pain; Chronic lower extremity pain (Bilateral); Chronic upper back pain (Primary Area of Pain) (Bilateral) (R>L); Chronic hand pain (Tertiary Area of Pain) (Bilateral) (L>R); Chronic wrist pain (Tertiary Area of Pain) (Bilateral) (L>R); Chronic foot/toes pain (Fourth Area of Pain) (Bilateral) (R>L); Chronic upper extremity pain (Secondary Area of Pain)  (Bilateral) (L>R); 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); Cervical foraminal stenosis (Bilateral) (C6-7); Chronic CNS demyelinating disease (MS); DDD (degenerative disc disease), thoracic; DDD (degenerative disc disease), lumbar; Lumbar facet arthropathy; Lumbar facet syndrome (Bilateral) (R>L); Cervical radiculitis (Bilateral); Chronic musculoskeletal pain; Neurogenic pain; Cervicogenic headache; Occipital headache; Trigger point with back pain (Left); Acute left-sided low back pain without sciatica; Spondylosis without myelopathy or radiculopathy, lumbosacral region; Cervical facet hypertrophy; Cervical facet syndrome (Bilateral) (R>L); Spondylosis without myelopathy or radiculopathy, cervical region; Chronic pain of both knees; Abnormal MRI, cervical spine (03/06/17); Acute postoperative pain; and Unstable knee, left on their pertinent problem list. Pain Assessment: Severity of Chronic pain is reported as a 7 /10. Location: Knee (see visit info) Left, Right/denies. Onset: More than a month ago. Quality: Discomfort, Constant, Aching, Stabbing (grinding, weakness). Timing: Constant. Modifying factor(s): heat. Vitals:  height is 5' 4"  (1.626 m) and weight is 250 lb (113.4 kg). Her temporal temperature is 97.2 F (36.2 C) (abnormal). Her blood pressure is 134/72 and her pulse is 83. Her respiration is 16 and oxygen saturation is 99%.   Reason for encounter: both, medication management and post-procedure assessment.  The patient comes into the clinic today for follow-up evaluation after a bilateral lumbar facet block that has provided her with 100% relief of the pain, except for occasional twinges of pain.  However, she has indicated that now she is able to pick up her child and she has thanked me for having "fixed her back".  I took this opportunity to point out to the patient that I do not want her to be disappointed in terms of her expectations and therefore I pointed out that I have not "fixed her  back", and what I have done is I have improved her condition temporarily.  I pointed out  that she has a chronic bilateral lumbar facet syndrome and she needs to continue losing weight until she brings her BMI below 30.  Today she indicated having some problems with knee pain that she wants me to address.  She indicates that in the case of the left knee, which is the worst 1, it tends to buckle.  I have reviewed her prior x-rays of the knees and neither 1 indicates any acute bony pathology.  However, since the left one seems to be unstable, I will be ordering an MRI.  Meanwhile, I will bring her back for bilateral intra-articular knee injections as she has requested.  She also touched on the subject of considering putting her on some pain medication since she has stopped her benzodiazepines.  I pointed out to her that the reason why I did not put her on opioid analgesics was not only because she was on benzodiazepines, but my evaluation indicated that she is high risk for substance use disorder and her condition is currently not bad enough to warrant the chronic use of opioid analgesics.  I rather she treat this pain with nonopioids.  To that matter, I have renewed her Robaxin and Lyrica prescriptions.  Since she is stable on those I will be transferring those to her PCP.  Nonopioids transferred (10/25/2019): Lyrica and Robaxin.  Post-Procedure Evaluation  Procedure (08/31/2019): Diagnostic/therapeutic bilateral lumbar facet block #3 under fluoroscopic guidance and IV sedation Pre-procedure pain level: 9/10 Post-procedure: 7/10 (< 50% relief)  Sedation: Sedation provided.  Effectiveness during initial hour after procedure(Ultra-Short Term Relief): 100 %.  Local anesthetic used: Long-acting (4-6 hours) Effectiveness: Defined as any analgesic benefit obtained secondary to the administration of local anesthetics. This carries significant diagnostic value as to the etiological location, or anatomical origin,  of the pain. Duration of benefit is expected to coincide with the duration of the local anesthetic used.  Effectiveness during initial 4-6 hours after procedure(Short-Term Relief): 100 %.  Long-term benefit: Defined as any relief past the pharmacologic duration of the local anesthetics.  Effectiveness past the initial 6 hours after procedure(Long-Term Relief): 85 % (patient states there is a twinge here and there but most of the time she is not having pain in her back.).  Current benefits: Defined as benefit that persist at this time.   Analgesia:  90-100% better.  The patient indicates that currently she is not having any pain and she is able to pick up her child without any problems.  Every so often she does have a little twinge, but it is a lot better than it was. Function: [name] reports improvement in function ROM: [name] reports improvement in ROM  Pharmacotherapy Assessment   Analgesic: None from our practice due to benzodiazepine use and high risk. The patient is also taking Marinol.   Monitoring: [place] PMP: PDMP reviewed during this encounter.       Pharmacotherapy: No side-effects or adverse reactions reported. Compliance: No problems identified. Effectiveness: Clinically acceptable.  No notes on file  UDS:  Summary  Date Value Ref Range Status  03/16/2018 FINAL  Final    Comment:    ==================================================================== [facility] COMP DRUG ANALYSIS,UR ==================================================================== Test                             Result       Flag       Units Drug Present and Declared for Prescription Verification   Desmethyldiazepam  214          EXPECTED   ng/mg creat   Oxazepam                       1241         EXPECTED   ng/mg creat   Temazepam                      1052         EXPECTED   ng/mg creat    Desmethyldiazepam, oxazepam, and temazepam are expected    metabolites of diazepam.  Desmethyldiazepam and oxazepam are also    expected metabolites of other drugs, including chlordiazepoxide,    prazepam, clorazepate, and halazepam. Oxazepam is an expected    metabolite of temazepam. Oxazepam and temazepam are also    available as scheduled prescription medications.   Pregabalin                     PRESENT      EXPECTED   Topiramate                     PRESENT      EXPECTED   Methocarbamol                  PRESENT      EXPECTED   Fluoxetine                     PRESENT      EXPECTED   Norfluoxetine                  PRESENT      EXPECTED    Norfluoxetine is an expected metabolite of fluoxetine. Drug Absent but Declared for Prescription Verification   Prochlorperazine               Not Detected UNEXPECTED   Ibuprofen                      Not Detected UNEXPECTED    Ibuprofen, as indicated in the declared medication list, is not    always detected even when used as directed. ==================================================================== Test                      Result    Flag   Units      Ref Range   Creatinine              29               mg/dL      >=20 ==================================================================== Declared Medications:  The flagging and interpretation on this report are based on the  following declared medications.  Unexpected results may arise from  inaccuracies in the declared medications.  **Note: The testing scope of this panel includes these medications:  Diazepam (Valium)  Fluoxetine (Prozac)  Methocarbamol (Robaxin)  Pregabalin (Lyrica)  Prochlorperazine (Compazine)  Topiramate (Topamax)  **Note: The testing scope of this panel does not include small to  moderate amounts of these reported medications:  Ibuprofen  **Note: The testing scope of this panel does not include following  reported medications:  Albuterol  Amlodipine (Norvasc)  Buspirone (BuSpar)  Divalproex (Depakote)  Epinephrine (EpiPen)  Etonogestrel  (Nexplanon)  Fingolimod (Gilenya)  Lithium (Lithobid)  Loratadine (Claritin)  Multivitamin  Ranitidine (Zantac) ==================================================================== For clinical consultation, please call [phone]. ====================================================================  ROS  Constitutional: Denies any fever or chills Gastrointestinal: No reported hemesis, hematochezia, vomiting, or acute GI distress Musculoskeletal: Denies any acute onset joint swelling, redness, loss of ROM, or weakness Neurological: No reported episodes of acute onset apraxia, aphasia, dysarthria, agnosia, amnesia, paralysis, loss of coordination, or loss of consciousness  Medication Review  EPINEPHrine, Erenumab-aooe, Fingolimod HCl, albuterol, amLODipine, cariprazine, diclofenac Sodium, diphenhydrAMINE, divalproex, dronabinol, loratadine, methocarbamol, omeprazole, potassium chloride SA, pregabalin, topiramate, and traZODone  History Review  Allergy: Ms. Penalver is allergic to baclofen, duloxetine hcl, gabapentin, monosodium glutamate, other, acetaminophen, alprazolam, amitriptyline, magnesium salicylate, modafinil, rizatriptan, tizanidine, vicodin [hydrocodone-acetaminophen], adhesive [tape], hydrocodone-acetaminophen, ketorolac tromethamine, lamotrigine, nsaids, and tramadol. Drug: [name]  reports no history of drug use. Alcohol:  reports no history of alcohol use. Tobacco:  reports that she quit smoking about 4 years ago. Her smoking use included cigarettes. She has a 2.50 pack-year smoking history. She has never used smokeless tobacco. Social: [name]  reports that she quit smoking about 4 years ago. Her smoking use included cigarettes. She has a 2.50 pack-year smoking history. She has never used smokeless tobacco. She reports that she does not drink alcohol and does not use drugs. Medical:  has a past medical history of ADHD, Anxiety, Asthma (as a child), Chronic back  pain, Chronic pain, Conversion disorder, DDD (degenerative disc disease), cervical, Depression, External hemorrhoid, Fatty liver, Fibromyalgia, GERD (gastroesophageal reflux disease), History of kidney stones, HTN (hypertension) (02/27/2015), JC virus antibody positive, Migraines, MS (multiple sclerosis) ([place]), Neuromuscular disorder ([place]), Neuropathy, OA (osteoarthritis), Ovarian cyst, Panic attack, Perforated bowel ([place]) (2009), Pneumonia, Pseudoseizures ([place]), PTSD (post-traumatic stress disorder), PTSD (post-traumatic stress disorder), S/P emergency C-section, Seasonal allergies, Urinary urgency, Vertigo, Wears contact lenses, and Wears glasses. Surgical: Ms. Bottcher  has a past surgical history that includes Extremity cyst excision (1994); Bowel resection (01/2007); Colostomy closure (04/2007); Scar revision (01/21/2011); Hernia repair; Abdominal surgery; Appendectomy; Cesarean section (N/A, 07/12/2015); Incisional hernia repair (N/A, 02/16/2016); Insertion of mesh (02/16/2016); Tooth Extraction (Left, 10/2016); Colonoscopy with propofol (N/A, 01/29/2017); Radiology with anesthesia (N/A, 03/06/2017); Exam under anesthesia with hemorrhoidectomy (N/A, 06/11/2018); Excisional hemorrhoidectomy; Transrectal drainage of pelvic abscess; Radiology with anesthesia (N/A, 09/15/2018); Radiology with anesthesia (N/A, 10/13/2018); Radiology with anesthesia (N/A, 10/26/2018); Colon surgery; ABDOMINAL WOUND EXPLORATION  (03/19/2019); laparotomy (03/19/2019); laparoscopy (N/A, 06/14/2019); Lysis of adhesion (N/A, 06/14/2019); laparotomy (N/A, 06/14/2019); and Bowel resection (N/A, 06/14/2019). Family: family history includes Arthritis in her father; Cancer in her maternal grandfather and maternal grandmother; Diabetes in her father and mother; Hypertension in her father and mother.  Laboratory Chemistry Profile   Renal Lab Results  Component Value Date   BUN 8 06/15/2019   CREATININE 0.70 06/15/2019   LABCREA 292.00 07/12/2015   BCR 12  08/13/2018   GFRAA >60 06/15/2019   GFRNONAA >60 06/15/2019     Hepatic Lab Results  Component Value Date   AST 18 06/07/2019   ALT 20 06/07/2019   ALBUMIN 3.8 06/07/2019   ALKPHOS 82 06/07/2019   LIPASE 41 07/12/2015     Electrolytes Lab Results  Component Value Date   NA 140 06/15/2019   K 3.7 06/15/2019   CL 105 06/15/2019   CALCIUM 8.8 (L) 06/15/2019   MG 2.2 03/16/2018     Bone Lab Results  Component Value Date   25OHVITD1 20 (L) 03/16/2018   25OHVITD2 4.7 03/16/2018   25OHVITD3 15 03/16/2018     Inflammation (CRP: Acute Phase) (ESR: Chronic Phase) Lab Results  Component Value Date   CRP 6 03/16/2018  ESRSEDRATE 20 03/16/2018   LATICACIDVEN 1.0 11/12/2018       Note: Above Lab results reviewed.  Recent Imaging Review  NM Gastric Emptying CLINICAL DATA:  Nausea  EXAM: NUCLEAR MEDICINE GASTRIC EMPTYING SCAN  TECHNIQUE: After oral ingestion of radiolabeled meal, sequential abdominal images were obtained for 4 hours. Percentage of activity emptying the stomach was calculated at 1 hour, 2 hour, 3 hour.  RADIOPHARMACEUTICALS:  2.2 mCi Tc-25msulfur colloid in standardized meal  COMPARISON:  None.  FINDINGS: Expected location of the stomach in the left upper quadrant. Ingested meal empties the stomach gradually over the course of the study.  51.6 % emptied at 1 hr ( normal >= 10%)  82.6% emptied at 2 hr ( normal >= 40%)  95.5% emptied at 3 hr ( normal >= 70%)  IMPRESSION: Normal gastric emptying study.  Electronically Signed   By: SValentino SaxonMD   On: 10/14/2019 16:45 Note: Reviewed        Physical Exam  General appearance: Well nourished, well developed, and well hydrated. In no apparent acute distress Mental status: Alert, oriented x 3 (person, place, & time)       Respiratory: No evidence of acute respiratory distress Eyes: PERLA Vitals: BP 134/72 (BP Location: Left Arm, Patient Position: Sitting, Cuff Size: Large)   Pulse 83    Temp (!) 97.2 F (36.2 C) (Temporal)   Resp 16   Ht 5' 4"  (1.626 m)   Wt 250 lb (113.4 kg)   LMP 10/03/2019   SpO2 99%   BMI 42.91 kg/m  BMI: Estimated body mass index is 42.91 kg/m as calculated from the following:   Height as of this encounter: 5' 4"  (1.626 m).   Weight as of this encounter: 250 lb (113.4 kg). Ideal: Ideal body weight: 54.7 kg (120 lb 9.5 oz) Adjusted ideal body weight: 78.2 kg (172 lb 5.7 oz)  Assessment   Status Diagnosis  Controlled Controlled Controlled 1. Chronic pain syndrome   2. Chronic neck pain (Primary Area of Pain) (Bilateral) (R>L)   3. Chronic upper back pain (Primary Area of Pain) (Bilateral) (R>L)   4. Chronic low back pain (Secondary Area of Pain) (Bilateral) (L>R)   5. Chronic hand pain (Tertiary Area of Pain) (Bilateral) (L>R)   6. Carpal tunnel syndrome (Tertiary Area of Pain) (Bilateral) (L>R)   7. Chronic foot/toes pain (Fourth Area of Pain) (Bilateral) (R>L)   8. Neurogenic pain   9. Chronic musculoskeletal pain   10. Chronic knee pain (Fifth Area of Pain) (Left)   11. Unstable knee, left      Updated Problems: Problem  Unstable Knee, Left  Ddd (Degenerative Disc Disease), Cervical   C5-6: broad disc protrusion with mild-moderate spinal stenosis  C6-7: Disc bulging with mild spinal stenosis   Formatting of this note might be different from the original. C5-6: broad disc protrusion with mild-moderate spinal stenosis  C6-7: Disc bulging with mild spinal stenosis     Plan of Care  Problem-specific:  No problem-specific Assessment & Plan notes found for this encounter.  [name] has a current medication list which includes the following long-term medication(s): albuterol, divalproex, gilenya, omeprazole, potassium chloride sa, topiramate, trazodone, [START ON 11/29/2019] methocarbamol, and [START ON 11/29/2019] pregabalin.  Pharmacotherapy (Medications Ordered): Meds ordered this encounter  Medications  .  pregabalin (LYRICA) 225 MG capsule    Sig: Take 1 capsule (225 mg total) by mouth 2 (two) times daily.    Dispense:  60 capsule  Refill:  2    Fill one day early if pharmacy is closed on scheduled refill date. May substitute for generic, or similar, if available.  . methocarbamol (ROBAXIN) 750 MG tablet    Sig: Take 1 tablet (750 mg total) by mouth 2 (two) times daily as needed for muscle spasms.    Dispense:  60 tablet    Refill:  2    Fill one day early if pharmacy is closed on scheduled refill date. May substitute for generic if available. Delete prior scripts of same medication.   Orders:  Orders Placed This Encounter  Procedures  . KNEE INJECTION    Local Anesthetic & Steroid injection.    Standing Status:   Future    Standing Expiration Date:   01/25/2020    Scheduling Instructions:     Side: Bilateral     Sedation: w/ sedation     Timeframe: As soon as schedule allows    Order Specific Question:   Where will this procedure be performed?    Answer:   [facility]  . MR KNEE LEFT WO CONTRAST    Standing Status:   Future    Standing Expiration Date:   11/25/2019    Scheduling Instructions:     Imaging must be done as soon as possible. Inform patient that order will expire within 30 days and I will not renew it.    Order Specific Question:   What is the patient's sedation requirement?    Answer:   No Sedation    Order Specific Question:   Does the patient have a pacemaker or implanted devices?    Answer:   No    Order Specific Question:   Preferred imaging location?    Answer:   [facility] (table limit-350lbs)    Order Specific Question:   Call Results- Best Contact Number?    Answer:   [phone] ([facility])    Order Specific Question:   Radiology Contrast Protocol - do NOT remove file path    Answer:   [email or web address]   Follow-up plan:   Return for Procedure (w/ sedation): (B) Knee inj (syeroid).       Interventional management options: Considering:   NOTE: Hold until complete closure and resolution of abdominal wound infection.  Diagnosticmidline thoracic ESI Diagnosticbilateral thoracic facet block  Possiblebilateral thoracic RFA Diagnostic right sided LESI Therapeutic bilateral lumbar facet RFA #1  Diagnosticleft IA knee injection Diagnosticleft knee genicular NB Possibleleft genicular nerve RFA  Diagnosticright-sided CESI Diagnosticright-sided cervical facet block Diagnosticright-sided cervical facet RFA Diagnosticright occipital NB   Palliative PRN treatment(s):   NOTE: No procedures will be scheduled until the patient fully heals from her infected abdominal wound. Palliative bilateral lumbar facet block #3       Recent Visits Date Type Provider Dept  08/31/19 Procedure visit Milinda Pointer, MD [facility]  08/23/19 Office Visit Milinda Pointer, MD [facility]  Showing recent visits within past 90 days and meeting all other requirements Today's Visits Date Type Provider Dept  10/25/19 Office Visit Milinda Pointer, MD [facility]  Showing today's visits and meeting all other requirements Future Appointments Date Type Provider Dept  11/02/19 Appointment Milinda Pointer, MD [facility]  Showing future appointments within next 90 days and meeting all other requirements  I discussed the assessment and treatment plan with the patient. The patient was provided an opportunity to ask questions and all were answered. The patient agreed with the plan and demonstrated  an understanding of the instructions.  Patient advised to call back or seek an in-person evaluation if the symptoms or condition worsens.  Duration of encounter: 30 minutes.  Note by: Gaspar Cola, MD Date: 10/25/2019; Time: 9:29 AM

## 2019-10-25 ENCOUNTER — Other Ambulatory Visit: Payer: Self-pay

## 2019-10-25 ENCOUNTER — Ambulatory Visit: Payer: Medicare Other | Attending: Pain Medicine | Admitting: Pain Medicine

## 2019-10-25 ENCOUNTER — Encounter: Payer: Self-pay | Admitting: Pain Medicine

## 2019-10-25 VITALS — BP 134/72 | HR 83 | Temp 97.2°F | Resp 16 | Ht 64.0 in | Wt 250.0 lb

## 2019-10-25 DIAGNOSIS — G5603 Carpal tunnel syndrome, bilateral upper limbs: Secondary | ICD-10-CM | POA: Diagnosis present

## 2019-10-25 DIAGNOSIS — M542 Cervicalgia: Secondary | ICD-10-CM | POA: Diagnosis present

## 2019-10-25 DIAGNOSIS — G894 Chronic pain syndrome: Secondary | ICD-10-CM

## 2019-10-25 DIAGNOSIS — M7918 Myalgia, other site: Secondary | ICD-10-CM

## 2019-10-25 DIAGNOSIS — M79674 Pain in right toe(s): Secondary | ICD-10-CM | POA: Insufficient documentation

## 2019-10-25 DIAGNOSIS — M79675 Pain in left toe(s): Secondary | ICD-10-CM | POA: Diagnosis present

## 2019-10-25 DIAGNOSIS — M25362 Other instability, left knee: Secondary | ICD-10-CM

## 2019-10-25 DIAGNOSIS — M5441 Lumbago with sciatica, right side: Secondary | ICD-10-CM | POA: Diagnosis present

## 2019-10-25 DIAGNOSIS — M792 Neuralgia and neuritis, unspecified: Secondary | ICD-10-CM

## 2019-10-25 DIAGNOSIS — G8929 Other chronic pain: Secondary | ICD-10-CM

## 2019-10-25 DIAGNOSIS — M25562 Pain in left knee: Secondary | ICD-10-CM | POA: Diagnosis present

## 2019-10-25 DIAGNOSIS — M79642 Pain in left hand: Secondary | ICD-10-CM

## 2019-10-25 DIAGNOSIS — M549 Dorsalgia, unspecified: Secondary | ICD-10-CM | POA: Diagnosis present

## 2019-10-25 DIAGNOSIS — M79641 Pain in right hand: Secondary | ICD-10-CM

## 2019-10-25 DIAGNOSIS — M5442 Lumbago with sciatica, left side: Secondary | ICD-10-CM | POA: Insufficient documentation

## 2019-10-25 MED ORDER — PREGABALIN 225 MG PO CAPS: 225.0000 mg | ORAL_CAPSULE | Freq: Two times a day (BID) | ORAL | 2 refills | Status: DC

## 2019-10-25 MED ORDER — METHOCARBAMOL 750 MG PO TABS: 750.0000 mg | ORAL_TABLET | Freq: Two times a day (BID) | ORAL | 2 refills | Status: DC | PRN

## 2019-10-25 NOTE — Patient Instructions (Signed)
____________________________________________________________________________________________  Preparing for Procedure with Sedation  Procedure appointments are limited to planned procedures: . No Prescription Refills. . No disability issues will be discussed. . No medication changes will be discussed.  Instructions: . Oral Intake: Do not eat or drink anything for at least 8 hours prior to your procedure. (Exception: Blood Pressure Medication. See below.) . Transportation: Unless otherwise stated by your physician, you may drive yourself after the procedure. . Blood Pressure Medicine: Do not forget to take your blood pressure medicine with a sip of water the morning of the procedure. If your Diastolic (lower reading)is above 100 mmHg, elective cases will be cancelled/rescheduled. . Blood thinners: These will need to be stopped for procedures. Notify our staff if you are taking any blood thinners. Depending on which one you take, there will be specific instructions on how and when to stop it. . Diabetics on insulin: Notify the staff so that you can be scheduled 1st case in the morning. If your diabetes requires high dose insulin, take only  of your normal insulin dose the morning of the procedure and notify the staff that you have done so. . Preventing infections: Shower with an antibacterial soap the morning of your procedure. . Build-up your immune system: Take 1000 mg of Vitamin C with every meal (3 times a day) the day prior to your procedure. . Antibiotics: Inform the staff if you have a condition or reason that requires you to take antibiotics before dental procedures. . Pregnancy: If you are pregnant, call and cancel the procedure. . Sickness: If you have a cold, fever, or any active infections, call and cancel the procedure. . Arrival: You must be in the facility at least 30 minutes prior to your scheduled procedure. . Children: Do not bring children with you. . Dress appropriately:  Bring dark clothing that you would not mind if they get stained. . Valuables: Do not bring any jewelry or valuables.  Reasons to call and reschedule or cancel your procedure: (Following these recommendations will minimize the risk of a serious complication.) . Surgeries: Avoid having procedures within 2 weeks of any surgery. (Avoid for 2 weeks before or after any surgery). . Flu Shots: Avoid having procedures within 2 weeks of a flu shots or . (Avoid for 2 weeks before or after immunizations). . Barium: Avoid having a procedure within 7-10 days after having had a radiological study involving the use of radiological contrast. (Myelograms, Barium swallow or enema study). . Heart attacks: Avoid any elective procedures or surgeries for the initial 6 months after a "Myocardial Infarction" (Heart Attack). . Blood thinners: It is imperative that you stop these medications before procedures. Let us know if you if you take any blood thinner.  . Infection: Avoid procedures during or within two weeks of an infection (including chest colds or gastrointestinal problems). Symptoms associated with infections include: Localized redness, fever, chills, night sweats or profuse sweating, burning sensation when voiding, cough, congestion, stuffiness, runny nose, sore throat, diarrhea, nausea, vomiting, cold or Flu symptoms, recent or current infections. It is specially important if the infection is over the area that we intend to treat. . Heart and lung problems: Symptoms that may suggest an active cardiopulmonary problem include: cough, chest pain, breathing difficulties or shortness of breath, dizziness, ankle swelling, uncontrolled high or unusually low blood pressure, and/or palpitations. If you are experiencing any of these symptoms, cancel your procedure and contact your primary care physician for an evaluation.  Remember:  Regular Business hours are:    Monday to Thursday 8:00 AM to 4:00 PM  Provider's  Schedule: Fransisca Shawn, MD:  Procedure days: Tuesday and Thursday 7:30 AM to 4:00 PM  Bilal Lateef, MD:  Procedure days: Monday and Wednesday 7:30 AM to 4:00 PM ____________________________________________________________________________________________    

## 2019-10-27 ENCOUNTER — Emergency Department (HOSPITAL_COMMUNITY)
Admission: EM | Admit: 2019-10-27 | Discharge: 2019-10-27 | Disposition: A | Discharge status: Home / Self Care | Payer: Medicare Other | Attending: Emergency Medicine | Admitting: Emergency Medicine

## 2019-10-27 ENCOUNTER — Other Ambulatory Visit: Payer: Self-pay

## 2019-10-27 ENCOUNTER — Encounter (HOSPITAL_COMMUNITY): Payer: Self-pay

## 2019-10-27 DIAGNOSIS — M545 Low back pain, unspecified: Secondary | ICD-10-CM | POA: Diagnosis present

## 2019-10-27 DIAGNOSIS — Z5321 Procedure and treatment not carried out due to patient leaving prior to being seen by health care provider: Secondary | ICD-10-CM | POA: Diagnosis not present

## 2019-10-27 DIAGNOSIS — R109 Unspecified abdominal pain: Secondary | ICD-10-CM | POA: Diagnosis not present

## 2019-10-27 LAB — URINALYSIS, ROUTINE W REFLEX MICROSCOPIC
Bilirubin Urine: NEGATIVE
Glucose, UA: NEGATIVE mg/dL
Ketones, ur: NEGATIVE mg/dL
Leukocytes,Ua: NEGATIVE
Nitrite: NEGATIVE
Protein, ur: NEGATIVE mg/dL
RBC / HPF: >50 RBC/hpf — ABNORMAL HIGH (ref 0–5)
Specific Gravity, Urine: 1.014 (ref 1.005–1.030)
pH: 8 (ref 5.0–8.0)

## 2019-10-27 NOTE — ED Triage Notes (Signed)
Pt to er, pt states that she is here for some back pain/ flank pain states that it started about a weeks ago, states that she has a hx of kidney stones, states that she thinks that it is a kidney stone, states that she normally passes the stones in a few days, but the pain continues.

## 2019-10-29 LAB — URINE CULTURE

## 2019-11-02 ENCOUNTER — Other Ambulatory Visit: Payer: Self-pay

## 2019-11-02 ENCOUNTER — Ambulatory Visit: Payer: Medicare Other | Attending: Pain Medicine | Admitting: Pain Medicine

## 2019-11-02 ENCOUNTER — Encounter: Payer: Self-pay | Admitting: Pain Medicine

## 2019-11-02 VITALS — BP 127/74 | HR 80 | Temp 97.3°F | Resp 16 | Ht 64.0 in | Wt 240.0 lb

## 2019-11-02 DIAGNOSIS — F419 Anxiety disorder, unspecified: Secondary | ICD-10-CM

## 2019-11-02 DIAGNOSIS — M170 Bilateral primary osteoarthritis of knee: Secondary | ICD-10-CM

## 2019-11-02 DIAGNOSIS — M25562 Pain in left knee: Secondary | ICD-10-CM

## 2019-11-02 DIAGNOSIS — G8929 Other chronic pain: Secondary | ICD-10-CM | POA: Diagnosis not present

## 2019-11-02 DIAGNOSIS — R55 Syncope and collapse: Secondary | ICD-10-CM

## 2019-11-02 DIAGNOSIS — M25561 Pain in right knee: Secondary | ICD-10-CM | POA: Diagnosis not present

## 2019-11-02 MED ORDER — FENTANYL CITRATE (PF) 100 MCG/2ML IJ SOLN
25.0000 ug | INTRAMUSCULAR | Status: DC | PRN
  Administered 2019-11-02: 50 ug via INTRAVENOUS
  Filled 2019-11-02: qty 2

## 2019-11-02 MED ORDER — METHYLPREDNISOLONE ACETATE 80 MG/ML IJ SUSP
80.0000 mg | Freq: Once | INTRAMUSCULAR | Status: AC
  Administered 2019-11-02: 80 mg
  Filled 2019-11-02: qty 1

## 2019-11-02 MED ORDER — METHYLPREDNISOLONE ACETATE 80 MG/ML IJ SUSP
80.0000 mg | Freq: Once | INTRAMUSCULAR | Status: AC
  Administered 2019-11-02: 80 mg via INTRA_ARTICULAR
  Filled 2019-11-02: qty 1

## 2019-11-02 MED ORDER — ROPIVACAINE HCL 2 MG/ML IJ SOLN
4.0000 mL | Freq: Once | INTRAMUSCULAR | Status: AC
  Administered 2019-11-02: 4 mL via INTRA_ARTICULAR
  Filled 2019-11-02: qty 10

## 2019-11-02 MED ORDER — ROPIVACAINE HCL 2 MG/ML IJ SOLN
4.0000 mL | Freq: Once | INTRAMUSCULAR | Status: AC
  Administered 2019-11-02: 4 mL via PERINEURAL
  Filled 2019-11-02: qty 10

## 2019-11-02 MED ORDER — GLYCOPYRROLATE 0.2 MG/ML IJ SOLN
0.2000 mg | Freq: Once | INTRAMUSCULAR | Status: DC
  Filled 2019-11-02: qty 1

## 2019-11-02 MED ORDER — MIDAZOLAM HCL 5 MG/5ML IJ SOLN
1.0000 mg | INTRAMUSCULAR | Status: DC | PRN
  Administered 2019-11-02: 2 mg via INTRAVENOUS
  Filled 2019-11-02: qty 5

## 2019-11-02 MED ORDER — LACTATED RINGERS IV SOLN
1000.0000 mL | Freq: Once | INTRAVENOUS | Status: AC
  Administered 2019-11-02: 1000 mL via INTRAVENOUS

## 2019-11-02 MED ORDER — DIPHENHYDRAMINE HCL 50 MG/ML IJ SOLN
12.5000 mg | Freq: Once | INTRAMUSCULAR | Status: AC
  Administered 2019-11-02: 12.5 mg via INTRAVENOUS
  Filled 2019-11-02: qty 1

## 2019-11-02 MED ORDER — LIDOCAINE HCL 2 % IJ SOLN
20.0000 mL | Freq: Once | INTRAMUSCULAR | Status: AC
  Administered 2019-11-02: 400 mg
  Filled 2019-11-02: qty 40

## 2019-11-02 NOTE — Patient Instructions (Signed)

## 2019-11-02 NOTE — Progress Notes (Signed)
Safety precautions to be maintained throughout the outpatient stay will include: orient to surroundings, keep bed in low position, maintain call bell within reach at all times, provide assistance with transfer out of bed and ambulation.  

## 2019-11-02 NOTE — Progress Notes (Signed)
PROVIDER NOTE: Information contained herein reflects review and annotations entered in association with encounter. Interpretation of such information and data should be left to medically-trained personnel. Information provided to patient can be located elsewhere in the medical record under "Patient Instructions". Document created using STT-dictation technology, any transcriptional errors that may result from process are unintentional.    Patient: Emily [name]  Service Category: Procedure  Provider: Gaspar Cola, MD  DOB: [birth year]  DOS: 11/02/2019  Location: [facility]  MRN: [id number]  Setting: Ambulatory - outpatient  Referring Provider: Leeanne Rio, MD  Type: Established Patient  Specialty: Interventional Pain Management  PCP: Leeanne Rio, MD   Primary Reason for Visit: Interventional Pain Management Treatment. CC: Knee Pain  Procedure:          Anesthesia, Analgesia, Anxiolysis:  Type: Diagnostic Intra-Articular Local anesthetic and steroid Knee Injection #1  Region: Lateral infrapatellar Knee Region Level: Knee Joint Laterality: Bilateral  Type: Moderate (Conscious) Sedation combined with Local Anesthesia Indication(s): Analgesia and Anxiety Local Anesthetic: Lidocaine 1-2% Route: Intravenous (IV) IV Access: Secured Sedation: Meaningful verbal contact was maintained at all times during the procedure   Position: Supine w/ knee bent 20 to 30 degrees   Indications: 1. Chronic pain of both knees   2. 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 History of vasovagal syncope    Pain Score: Pre-procedure: 8 /10 Post-procedure: 0-No pain/10   Pre-op Assessment:  Emily [name] is a 39 y.o. (year old), female patient, seen today for interventional treatment. 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); Scar revision (01/21/2011);  Hernia repair; Abdominal surgery; Appendectomy; Cesarean section (N/A, 07/12/2015); Incisional hernia repair (N/A, 02/16/2016); Insertion of mesh (02/16/2016); Tooth Extraction (Left, 10/2016); Colonoscopy with propofol (N/A, 01/29/2017); Radiology with anesthesia (N/A, 03/06/2017); Exam under anesthesia with hemorrhoidectomy (N/A, 06/11/2018); Excisional hemorrhoidectomy; Transrectal drainage of pelvic abscess; Radiology with anesthesia (N/A, 09/15/2018); Radiology with anesthesia (N/A, 10/13/2018); Radiology with anesthesia (N/A, 10/26/2018); Colon surgery; ABDOMINAL WOUND EXPLORATION  (03/19/2019); laparotomy (03/19/2019); laparoscopy (N/A, 06/14/2019); Lysis of adhesion (N/A, 06/14/2019); laparotomy (N/A, 06/14/2019); and Bowel resection (N/A, 06/14/2019). Emily [name] has a current medication list which includes the following prescription(s): aimovig, albuterol, amlodipine, cariprazine, diclofenac sodium, diphenhydramine, divalproex, dronabinol, epinephrine, gilenya, loratadine, [START ON 11/29/2019] methocarbamol, omeprazole, potassium chloride sa, [START ON 11/29/2019] pregabalin, topiramate, and trazodone, and the following Facility-Administered Medications: fentanyl, glycopyrrolate, metoclopramide, and midazolam. Her primarily concern today is the Knee Pain  Initial Vital Signs:  Pulse/HCG Rate: 80ECG Heart Rate: 70 Temp: (!) 97.5 F (36.4 C) Resp: 16 BP: 136/84 SpO2: 100 %  BMI: Estimated body mass index is 41.2 kg/m as calculated from the following:   Height as of this encounter: 5\' 4"  (1.626 m).   Weight as of this encounter: 240 lb (108.9 kg).  Risk Assessment: Allergies: Reviewed. She is allergic to baclofen, duloxetine hcl, gabapentin, monosodium glutamate, other, acetaminophen, alprazolam, amitriptyline, magnesium salicylate, modafinil, rizatriptan, tizanidine, vicodin [hydrocodone-acetaminophen], adhesive [tape], hydrocodone-acetaminophen, ketorolac tromethamine, lamotrigine, nsaids, and tramadol.  Allergy  Precautions: None required Coagulopathies: Reviewed. None identified.  Blood-thinner therapy: None at this time Active Infection(s): Reviewed. None identified. Emily [name] is afebrile  Site Confirmation: Emily [name] was asked to confirm the procedure and laterality before marking the site Procedure checklist: Completed Consent: Before the procedure and under the influence of no sedative(s), amnesic(s), or anxiolytics, the patient was informed of the treatment  options, risks and possible complications. To fulfill our ethical and legal obligations, as recommended by the American Medical Association's Code of Ethics, I have informed the patient of my clinical impression; the nature and purpose of the treatment or procedure; the risks, benefits, and possible complications of the intervention; the alternatives, including doing nothing; the risk(s) and benefit(s) of the alternative treatment(s) or procedure(s); and the risk(s) and benefit(s) of doing nothing. The patient was provided information about the general risks and possible complications associated with the procedure. These may include, but are not limited to: failure to achieve desired goals, infection, bleeding, organ or nerve damage, allergic reactions, paralysis, and death. In addition, the patient was informed of those risks and complications associated to the procedure, such as failure to decrease pain; infection; bleeding; organ or nerve damage with subsequent damage to sensory, motor, and/or autonomic systems, resulting in permanent pain, numbness, and/or weakness of one or several areas of the body; allergic reactions; (i.e.: anaphylactic reaction); and/or death. Furthermore, the patient was informed of those risks and complications associated with the medications. These include, but are not limited to: allergic reactions (i.e.: anaphylactic or anaphylactoid reaction(s)); adrenal axis suppression; blood sugar elevation that in diabetics may result  in ketoacidosis or comma; water retention that in patients with history of congestive heart failure may result in shortness of breath, pulmonary edema, and decompensation with resultant heart failure; weight gain; swelling or edema; medication-induced neural toxicity; particulate matter embolism and blood vessel occlusion with resultant organ, and/or nervous system infarction; and/or aseptic necrosis of one or more joints. Finally, the patient was informed that Medicine is not an exact science; therefore, there is also the possibility of unforeseen or unpredictable risks and/or possible complications that may result in a catastrophic outcome. The patient indicated having understood very clearly. We have given the patient no guarantees and we have made no promises. Enough time was given to the patient to ask questions, all of which were answered to the patient's satisfaction. Ms. Lingelbach has indicated that she wanted to continue with the procedure. Attestation: I, the ordering provider, attest that I have discussed with the patient the benefits, risks, side-effects, alternatives, likelihood of achieving goals, and potential problems during recovery for the procedure that I have provided informed consent. Date  Time: 11/02/2019  8:19 AM  Pre-Procedure Preparation:  Monitoring: As per clinic protocol. Respiration, ETCO2, SpO2, BP, heart rate and rhythm monitor placed and checked for adequate function Safety Precautions: Patient was assessed for positional comfort and pressure points before starting the procedure. Time-out: I initiated and conducted the "Time-out" before starting the procedure, as per protocol. The patient was asked to participate by confirming the accuracy of the "Time Out" information. Verification of the correct person, site, and procedure were performed and confirmed by me, the nursing staff, and the patient. "Time-out" conducted as per Joint Commission's Universal Protocol  (UP.01.01.01). Time: 0959  Description of Procedure:          Target Area: Knee Joint Approach: Just above the Lateral tibial plateau, lateral to the infrapatellar tendon. Area Prepped: Entire knee area, from the mid-thigh to the mid-shin. DuraPrep (Iodine Povacrylex [0.7% available iodine] and Isopropyl Alcohol, 74% w/w) Safety Precautions: Aspiration looking for blood return was conducted prior to all injections. At no point did we inject any substances, as a needle was being advanced. No attempts were made at seeking any paresthesias. Safe injection practices and needle disposal techniques used. Medications properly checked for expiration dates. SDV (single dose vial)  medications used. Description of the Procedure: Protocol guidelines were followed. The patient was placed in position over the fluoroscopy table. The target area was identified and the area prepped in the usual manner. Skin & deeper tissues infiltrated with local anesthetic. Appropriate amount of time allowed to pass for local anesthetics to take effect. The procedure needles were then advanced to the target area. Proper needle placement secured. Negative aspiration confirmed. Solution injected in intermittent fashion, asking for systemic symptoms every 0.5cc of injectate. The needles were then removed and the area cleansed, making sure to leave some of the prepping solution back to take advantage of its long term bactericidal properties. Vitals:   11/02/19 0905 11/02/19 0915 11/02/19 0925 11/02/19 0936  BP: (!) 98/55 (!) 104/52 120/65 127/74  Pulse:      Resp: 16 16 16 16   Temp:  (!) 97.4 F (36.3 C)  (!) 97.3 F (36.3 C)  SpO2: 95% 96% 97% 96%  Weight:      Height:        Start Time: 0859 hrs. End Time: 0905 hrs. Materials:  Needle(s) Type: Regular needle Gauge: 25G Length: 1.5-in Medication(s): Please see orders for medications and dosing details.  Imaging Guidance:          Type of Imaging Technique: None  used Indication(s): N/A Exposure Time: No patient exposure Contrast: None used. Fluoroscopic Guidance: N/A Ultrasound Guidance: N/A Interpretation: N/A  Antibiotic Prophylaxis:   Anti-infectives (From admission, onward)   None     Indication(s): None identified  Post-operative Assessment:  Post-procedure Vital Signs:  Pulse/HCG Rate: 8077 Temp: (!) 97.3 F (36.3 C) Resp: 16 BP: 127/74 SpO2: 96 %  EBL: None  Complications: No immediate post-treatment complications observed by team, or reported by patient.  Note: The patient tolerated the entire procedure well. A repeat set of vitals were taken after the procedure and the patient was kept under observation following institutional policy, for this type of procedure. Post-procedural neurological assessment was performed, showing return to baseline, prior to discharge. The patient was provided with post-procedure discharge instructions, including a section on how to identify potential problems. Should any problems arise concerning this procedure, the patient was given instructions to immediately contact us, at any time, without hesitation. In any case, we plan to contact the patient by telephone for a follow-up status report regarding this interventional procedure.  Comments:  No additional relevant information.  Plan of Care  Orders:  Orders Placed This Encounter  Procedures  . KNEE INJECTION    Local Anesthetic & Steroid injection.    Scheduling Instructions:     Side(s): Bilateral Knee     Sedation: With Sedation.     Timeframe: Today    Order Specific Question:   Where will this procedure be performed?    Answer:   [facility]  . Informed Consent Details: Physician/Practitioner Attestation; Transcribe to consent form and obtain patient signature    Note: Always confirm laterality of pain with Ms. Batchelder, before procedure. Transcribe to consent form and obtain patient signature.    Order Specific Question:    Physician/Practitioner attestation of informed consent for procedure/surgical case    Answer:   I, the physician/practitioner, attest that I have discussed with the patient the benefits, risks, side effects, alternatives, likelihood of achieving goals and potential problems during recovery for the procedure that I have provided informed consent.    Order Specific Question:   Procedure    Answer:   Bilateral intra-articular knee arthrocentesis (aspiration  and/or injection)    Order Specific Question:   Physician/Practitioner performing the procedure    Answer:   Milyn Stapleton A. Dossie Arbour, MD    Order Specific Question:   Indication/Reason    Answer:   Chronic bilateral knee pain secondary to knee arthropathy/arthralgia  . Provide equipment / supplies at bedside    "Block Tray" (Disposable  single use) Needle type: SpinalRegular Amount/quantity: 2 Size: Short(1.5-inch) Gauge: 25G    Standing Status:   Standing    Number of Occurrences:   1    Order Specific Question:   Specify    Answer:   Block Tray   Chronic Opioid Analgesic:  None from our practice due to benzodiazepine use and high risk. The patient is also taking Marinol.   Medications ordered for procedure: Meds ordered this encounter  Medications  . lidocaine (XYLOCAINE) 2 % (with pres) injection 400 mg  . lactated ringers infusion 1,000 mL  . midazolam (VERSED) 5 MG/5ML injection 1-2 mg    Make sure Flumazenil is available in the pyxis when using this medication. If oversedation occurs, administer 0.2 mg IV over 15 sec. If after 45 sec no response, administer 0.2 mg again over 1 min; may repeat at 1 min intervals; not to exceed 4 doses (1 mg)  . fentaNYL (SUBLIMAZE) injection 25-50 mcg    Make sure Narcan is available in the pyxis when using this medication. In the event of respiratory depression (RR< 8/min): Titrate NARCAN (naloxone) in increments of 0.1 to 0.2 mg IV at 2-3 minute intervals, until desired degree of reversal.  .  glycopyrrolate (ROBINUL) injection 0.2 mg  . diphenhydrAMINE (BENADRYL) injection 12.5 mg  . methylPREDNISolone acetate (DEPO-MEDROL) injection 80 mg  . ropivacaine (PF) 2 mg/mL (0.2%) (NAROPIN) injection 4 mL  . methylPREDNISolone acetate (DEPO-MEDROL) injection 80 mg  . ropivacaine (PF) 2 mg/mL (0.2%) (NAROPIN) injection 4 mL   Medications administered: We administered lidocaine, lactated ringers, midazolam, fentaNYL, diphenhydrAMINE, methylPREDNISolone acetate, ropivacaine (PF) 2 mg/mL (0.2%), methylPREDNISolone acetate, and ropivacaine (PF) 2 mg/mL (0.2%).  See the medical record for exact dosing, route, and time of administration.  Follow-up plan:   Return in about 2 weeks (around 11/16/2019) for (VV), (PP) Follow-up.       Interventional management options: Considering:   NOTE: Hold until complete closure and resolution of abdominal wound infection.  Diagnosticmidline thoracic ESI Diagnosticbilateral thoracic facet block  Possiblebilateral thoracic RFA Diagnostic right sided LESI Therapeutic bilateral lumbar facet RFA #1  Diagnosticleft IA knee injection Diagnosticleft knee genicular NB Possibleleft genicular nerve RFA  Diagnosticright-sided CESI Diagnosticright-sided cervical facet block Diagnosticright-sided cervical facet RFA Diagnosticright occipital NB   Palliative PRN treatment(s):   Palliative bilateral lumbar facet block #3     Recent Visits Date Type Provider Dept  10/25/19 Office Visit Milinda Pointer, MD [facility]  08/31/19 Procedure visit Milinda Pointer, MD [facility]  08/23/19 Office Visit Milinda Pointer, MD [facility]  Showing recent visits within past 90 days and meeting all other requirements Today's Visits Date Type Provider Dept  11/02/19 Procedure visit Milinda Pointer, MD [facility]  Showing today's visits and meeting all other requirements Future Appointments Date Type  Provider Dept  11/17/19 Appointment Milinda Pointer, MD [facility]  Showing future appointments within next 90 days and meeting all other requirements  Disposition: Discharge home  Discharge (Date  Time): 11/02/2019; 0936 hrs.   Primary Care Physician: Leeanne Rio, MD Location: [facility] Note by:  Gaspar Cola, MD Date: 11/02/2019; Time: 10:50 AM  Disclaimer:  Medicine is not an Chief Strategy Officer. The only guarantee in medicine is that nothing is guaranteed. It is important to note that the decision to proceed with this intervention was based on the information collected from the patient. The Data and conclusions were drawn from the patient's questionnaire, the interview, and the physical examination. Because the information was provided in large part by the patient, it cannot be guaranteed that it has not been purposely or unconsciously manipulated. Every effort has been made to obtain as much relevant data as possible for this evaluation. It is important to note that the conclusions that lead to this procedure are derived in large part from the available data. Always take into account that the treatment will also be dependent on availability of resources and existing treatment guidelines, considered by other Pain Management Practitioners as being common knowledge and practice, at the time of the intervention. For Medico-Legal purposes, it is also important to point out that variation in procedural techniques and pharmacological choices are the acceptable norm. The indications, contraindications, technique, and results of the above procedure should only be interpreted and judged by a Board-Certified Interventional Pain Specialist with extensive familiarity and expertise in the same exact procedure and technique.

## 2019-11-03 ENCOUNTER — Telehealth: Payer: Self-pay

## 2019-11-03 NOTE — Telephone Encounter (Signed)
Post procedure phone call.  Patient states she is doing really good.

## 2019-11-09 ENCOUNTER — Ambulatory Visit: Payer: Medicare Other

## 2019-11-10 ENCOUNTER — Telehealth: Payer: Self-pay

## 2019-11-10 NOTE — Telephone Encounter (Signed)
I called the pharmacy, patient filled Lyrica on 10-25-19, she may fill the next one on 11-19-19. Patient notified.

## 2019-11-10 NOTE — Telephone Encounter (Signed)
She had an appt on 10/18 about the lyrica and he was supposed to call it out but the pharmacy doesn't have any scripts. Please call her.

## 2019-11-16 NOTE — Progress Notes (Signed)
Canceled.

## 2019-11-17 ENCOUNTER — Encounter: Payer: Self-pay | Admitting: Pain Medicine

## 2019-11-17 ENCOUNTER — Encounter: Payer: Medicare Other | Admitting: Pain Medicine

## 2019-11-17 NOTE — Progress Notes (Addendum)
Patient: Emily [name]  Service Category: E/M  Provider: Gaspar Cola, MD  DOB: 1980/04/15  DOS: 11/18/2019  Location: Office  MRN: [id number]  Setting: Ambulatory outpatient  Referring Provider: Leeanne Rio, MD  Type: Established Patient  Specialty: Interventional Pain Management  PCP: Leeanne Rio, MD  Location: Remote location  Delivery: TeleHealth     Virtual Encounter - Pain Management PROVIDER NOTE: Information contained herein reflects review and annotations entered in association with encounter. Interpretation of such information and data should be left to medically-trained personnel. Information provided to patient can be located elsewhere in the medical record under "Patient Instructions". Document created using STT-dictation technology, any transcriptional errors that may result from process are unintentional.    Contact & Pharmacy Preferred: [phone] Home: [phone] (home) Mobile: [phone] (mobile) E-mail: [email or web address]  [id number] - [place], [place] [address] [place] [address] Phone: [phone] Fax: [place], [place] - [address] [name] [address] Phone: [phone] Fax: [phone]   Pre-screening  Ms. Wehrli offered "in-person" vs "virtual" encounter. She indicated preferring virtual for this encounter.   Reason COVID-19*  Social distancing based on CDC and AMA recommendations.   I contacted [place] on 11/18/2019 via telephone.      I clearly identified myself as Gaspar Cola, MD. I verified that I was speaking with the correct person using two identifiers (Name: Jozi L Chevez, and date of birth: January 06, 1981).  Consent I sought verbal advanced consent from [place] for virtual visit interactions. I informed Ms. Jungbluth of possible security and privacy concerns, risks, and  limitations associated with providing "not-in-person" medical evaluation and management services. I also informed Ms. Bergen of the availability of "in-person" appointments. Finally, I informed her that there would be a charge for the virtual visit and that she could be  personally, fully or partially, financially responsible for it. Ms. Reine expressed understanding and agreed to proceed.   Historic Elements   Ms. Donesha L Mccollam is a 39 y.o. year old, female patient evaluated today after our last contact on 11/02/2019. Ms. Trabucco  has a past medical history of ADHD, Anxiety, Asthma (as a child), Chronic back pain, Chronic pain, Conversion disorder, DDD (degenerative disc disease), cervical, Depression, External hemorrhoid, Fatty liver, Fibromyalgia, GERD (gastroesophageal reflux disease), History of kidney stones, HTN (hypertension) (02/27/2015), JC virus antibody positive, Migraines, MS (multiple sclerosis) ([place]), Neuromuscular disorder ([place]), Neuropathy, OA (osteoarthritis), Ovarian cyst, Panic attack, Perforated bowel ([place]) (2009), Pneumonia, Pseudoseizures ([place]), PTSD (post-traumatic stress disorder), PTSD (post-traumatic stress disorder), S/P emergency C-section, Seasonal allergies, Urinary urgency, Vertigo, Wears contact lenses, and Wears glasses. She also  has a past surgical history that includes Extremity cyst excision (1994); Bowel resection (01/2007); Colostomy closure (04/2007); Scar revision (01/21/2011); Hernia repair; Abdominal surgery; Appendectomy; Cesarean section (N/A, 07/12/2015); Incisional hernia repair (N/A, 02/16/2016); Insertion of mesh (02/16/2016); Tooth Extraction (Left, 10/2016); Colonoscopy with propofol (N/A, 01/29/2017); Radiology with anesthesia (N/A, 03/06/2017); Exam under anesthesia with hemorrhoidectomy (N/A, 06/11/2018); Excisional hemorrhoidectomy; Transrectal drainage of pelvic abscess; Radiology with anesthesia (N/A, 09/15/2018); Radiology with anesthesia (N/A, 10/13/2018); Radiology with  anesthesia (N/A, 10/26/2018); Colon surgery; ABDOMINAL WOUND EXPLORATION  (03/19/2019); laparotomy (03/19/2019); laparoscopy (N/A, 06/14/2019); Lysis of adhesion (N/A, 06/14/2019); laparotomy (N/A, 06/14/2019); and Bowel resection (N/A, 06/14/2019). Ms. Lute has a current medication list which includes the following prescription(s): aimovig, albuterol, amlodipine, cariprazine, diclofenac sodium, diphenhydramine,  divalproex, dronabinol, epinephrine, gilenya, loratadine, [START ON 11/29/2019] methocarbamol, omeprazole, potassium chloride sa, [START ON 11/29/2019] pregabalin, topiramate, and trazodone, and the following Facility-Administered Medications: metoclopramide. She  reports that she quit smoking about 4 years ago. Her smoking use included cigarettes. She has a 2.50 pack-year smoking history. She has never used smokeless tobacco. She reports that she does not drink alcohol and does not use drugs. Ms. Winebarger is allergic to baclofen, duloxetine hcl, gabapentin, monosodium glutamate, other, acetaminophen, alprazolam, amitriptyline, magnesium salicylate, modafinil, rizatriptan, tizanidine, vicodin [hydrocodone-acetaminophen], adhesive [tape], hydrocodone-acetaminophen, ketorolac tromethamine, lamotrigine, nsaids, and tramadol.   HPI  Today, she is being contacted for a post-procedure assessment.  The patient indicates having done extremely well after the intra-articular knee joint injections with a steroid.  She actually refers having had complete relief of the pain for some time until her left knee "popped".  This triggered some severe pain for approximately 5 days, but at this point has gone back to doing well.  She refers having over 90% ongoing pain relief in the knee area.  She has an MRI of the left knee scheduled for November 18, precisely because of her complaints of that knee popping, which led [place] to believe that there may be some instability.  The patient was instructed to give [place] a call after she has the MRI  done so that we can make arrangements for a virtual encounter to go over the results of that MRI.  For now she is doing well and there is no need to do anything else.  Post-Procedure Evaluation  Procedure (11/02/2019): Diagnostic bilateral IA knee joint injection #1 with local anesthetic and steroid, no fluoroscopy, but the patient requested IV sedation secondary to her anxiety to procedures. Pre-procedure pain level: 8/10 Post-procedure: 0/10 (100% relief)  Sedation: Sedation provided.  Effectiveness during initial hour after procedure(Ultra-Short Term Relief): 100 %.  Local anesthetic used: Long-acting (4-6 hours) Effectiveness: Defined as any analgesic benefit obtained secondary to the administration of local anesthetics. This carries significant diagnostic value as to the etiological location, or anatomical origin, of the pain. Duration of benefit is expected to coincide with the duration of the local anesthetic used.  Effectiveness during initial 4-6 hours after procedure(Short-Term Relief): 100 %.  Long-term benefit: Defined as any relief past the pharmacologic duration of the local anesthetics.  Effectiveness past the initial 6 hours after procedure(Long-Term Relief): 90 %.  Current benefits: Defined as benefit that persist at this time.   Analgesia:  90-100% better Function: Ms. Allerton reports improvement in function ROM: Ms. Waskey reports improvement in ROM  Pharmacotherapy Assessment  Analgesic: None from our practice due to benzodiazepine use and high risk. The patient is also taking Marinol.   Monitoring: [place] PMP: PDMP reviewed during this encounter.       Pharmacotherapy: No side-effects or adverse reactions reported. Compliance: No problems identified. Effectiveness: Clinically acceptable. Plan: Refer to "POC".  UDS:  Summary  Date Value Ref Range Status  03/16/2018 FINAL  Final    Comment:     ==================================================================== TOX[facility]RE COMP DRUG ANALYSIS,UR ==================================================================== Test                             Result       Flag       Units Drug Present and Declared for Prescription Verification   Desmethyldiazepam              214  EXPECTED   ng/mg creat   Oxazepam                       1241         EXPECTED   ng/mg creat   Temazepam                      1052         EXPECTED   ng/mg creat    Desmethyldiazepam, oxazepam, and temazepam are expected    metabolites of diazepam. Desmethyldiazepam and oxazepam are also    expected metabolites of other drugs, including chlordiazepoxide,    prazepam, clorazepate, and halazepam. Oxazepam is an expected    metabolite of temazepam. Oxazepam and temazepam are also    available as scheduled prescription medications.   Pregabalin                     PRESENT      EXPECTED   Topiramate                     PRESENT      EXPECTED   Methocarbamol                  PRESENT      EXPECTED   Fluoxetine                     PRESENT      EXPECTED   Norfluoxetine                  PRESENT      EXPECTED    Norfluoxetine is an expected metabolite of fluoxetine. Drug Absent but Declared for Prescription Verification   Prochlorperazine               Not Detected UNEXPECTED   Ibuprofen                      Not Detected UNEXPECTED    Ibuprofen, as indicated in the declared medication list, is not    always detected even when used as directed. ==================================================================== Test                      Result    Flag   Units      Ref Range   Creatinine              29               mg/dL      >=20 ==================================================================== Declared Medications:  The flagging and interpretation on this report are based on the  following declared medications.  Unexpected results may arise from  inaccuracies in  the declared medications.  **Note: The testing scope of this panel includes these medications:  Diazepam (Valium)  Fluoxetine (Prozac)  Methocarbamol (Robaxin)  Pregabalin (Lyrica)  Prochlorperazine (Compazine)  Topiramate (Topamax)  **Note: The testing scope of this panel does not include small to  moderate amounts of these reported medications:  Ibuprofen  **Note: The testing scope of this panel does not include following  reported medications:  Albuterol  Amlodipine (Norvasc)  Buspirone (BuSpar)  Divalproex (Depakote)  Epinephrine (EpiPen)  Etonogestrel (Nexplanon)  Fingolimod (Gilenya)  Lithium (Lithobid)  Loratadine (Claritin)  Multivitamin  Ranitidine (Zantac) ==================================================================== For clinical consultation, please call [phone]. ====================================================================     Laboratory Chemistry Profile   Renal Lab Results  Component Value Date   BUN 8 06/15/2019   CREATININE 0.70 06/15/2019   LABCREA 292.00 07/12/2015   BCR 12 08/13/2018   GFRAA >60 06/15/2019   GFRNONAA >60 06/15/2019     Hepatic Lab Results  Component Value Date   AST 18 06/07/2019   ALT 20 06/07/2019   ALBUMIN 3.8 06/07/2019   ALKPHOS 82 06/07/2019   LIPASE 41 07/12/2015     Electrolytes Lab Results  Component Value Date   NA 140 06/15/2019   K 3.7 06/15/2019   CL 105 06/15/2019   CALCIUM 8.8 (L) 06/15/2019   MG 2.2 03/16/2018     Bone Lab Results  Component Value Date   25OHVITD1 20 (L) 03/16/2018   25OHVITD2 4.7 03/16/2018   25OHVITD3 15 03/16/2018     Inflammation (CRP: Acute Phase) (ESR: Chronic Phase) Lab Results  Component Value Date   CRP 6 03/16/2018   ESRSEDRATE 20 03/16/2018   LATICACIDVEN 1.0 11/12/2018       Note: Above Lab results reviewed.  Imaging  NM Gastric Emptying CLINICAL DATA:  Nausea  EXAM: NUCLEAR MEDICINE GASTRIC EMPTYING SCAN  TECHNIQUE: After oral  ingestion of radiolabeled meal, sequential abdominal images were obtained for 4 hours. Percentage of activity emptying the stomach was calculated at 1 hour, 2 hour, 3 hour.  RADIOPHARMACEUTICALS:  2.2 mCi Tc-18msulfur colloid in standardized meal  COMPARISON:  None.  FINDINGS: Expected location of the stomach in the left upper quadrant. Ingested meal empties the stomach gradually over the course of the study.  51.6 % emptied at 1 hr ( normal >= 10%)  82.6% emptied at 2 hr ( normal >= 40%)  95.5% emptied at 3 hr ( normal >= 70%)  IMPRESSION: Normal gastric emptying study.  Electronically Signed   By: SValentino SaxonMD   On: 10/14/2019 16:45  Assessment  The primary encounter diagnosis was Chronic pain syndrome. Diagnoses of Chronic pain of both knees, Chronic low back pain (Secondary Area of Pain) (Bilateral) (L>R), and Chronic neck pain (Primary Area of Pain) (Bilateral) (R>L) were also pertinent to this visit.  Plan of Care  Problem-specific:  No problem-specific Assessment & Plan notes found for this encounter.  Ms. CHalyn FlaugherMabes has a current medication list which includes the following long-term medication(s): albuterol, divalproex, gilenya, [START ON 11/29/2019] methocarbamol, omeprazole, potassium chloride sa, [START ON 11/29/2019] pregabalin, topiramate, and trazodone.  Pharmacotherapy (Medications Ordered): No orders of the defined types were placed in this encounter.  Orders:  No orders of the defined types were placed in this encounter.  Follow-up plan:   Return if symptoms worsen or fail to improve, for (s/p Tests) to review left knee MRI scheduled for November 18th.      Interventional management options: Considering:   NOTE: Hold until complete closure and resolution of abdominal wound infection.  Diagnosticmidline thoracic ESI Diagnosticbilateral thoracic facet block  Possiblebilateral thoracic RFA Diagnostic right sided LESI Therapeutic  bilateral lumbar facet RFA #1  Diagnosticleft IA knee injection Diagnosticleft knee genicular NB Possibleleft genicular nerve RFA  Diagnosticright-sided CESI Diagnosticright-sided cervical facet block Diagnosticright-sided cervical facet RFA Diagnosticright occipital NB   Palliative PRN treatment(s):   Palliative bilateral lumbar facet block #3       Recent Visits Date Type Provider Dept  11/02/19 Procedure visit NMilinda Pointer MD [facility]  10/25/19 Office Visit NMilinda Pointer MD [facility]  08/31/19 Procedure visit NMilinda Pointer MD [facility]  08/23/19 Office Visit NMilinda Pointer MD [facility]  Showing recent visits within past 90 days and meeting all other requirements Today's Visits Date Type Provider Dept  11/18/19 Telemedicine Milinda Pointer, MD [facility]  Showing today's visits and meeting all other requirements Future Appointments No visits were found meeting these conditions. Showing future appointments within next 90 days and meeting all other requirements  I discussed the assessment and treatment plan with the patient. The patient was provided an opportunity to ask questions and all were answered. The patient agreed with the plan and demonstrated an understanding of the instructions.  Patient advised to call back or seek an in-person evaluation if the symptoms or condition worsens.  Duration of encounter: 18 minutes.  Note by: Gaspar Cola, MD Date: 11/18/2019; Time: 4:03 PM

## 2019-11-18 ENCOUNTER — Other Ambulatory Visit: Payer: Self-pay

## 2019-11-18 ENCOUNTER — Ambulatory Visit: Payer: Medicare Other | Attending: Pain Medicine | Admitting: Pain Medicine

## 2019-11-18 DIAGNOSIS — M25561 Pain in right knee: Secondary | ICD-10-CM

## 2019-11-18 DIAGNOSIS — M5441 Lumbago with sciatica, right side: Secondary | ICD-10-CM

## 2019-11-18 DIAGNOSIS — M25562 Pain in left knee: Secondary | ICD-10-CM

## 2019-11-18 DIAGNOSIS — G894 Chronic pain syndrome: Secondary | ICD-10-CM

## 2019-11-18 DIAGNOSIS — G8929 Other chronic pain: Secondary | ICD-10-CM

## 2019-11-18 DIAGNOSIS — M5442 Lumbago with sciatica, left side: Secondary | ICD-10-CM | POA: Diagnosis not present

## 2019-11-18 DIAGNOSIS — M542 Cervicalgia: Secondary | ICD-10-CM

## 2019-11-22 ENCOUNTER — Telehealth: Payer: Self-pay | Admitting: Neurology

## 2019-11-22 NOTE — Telephone Encounter (Signed)
Can switch to [facility] and see if this helps her better. The first dose will be 240mg  (2 injections), then 120mg  (1 dose) every month after. She is due for annual MRI for f/u of MS. She will need sedation and will need appt before we can order this. I will have to look for an open slot. Regardless of cause, if it is MS, she is already seeing a pain specialist, there is no further pain medication I can order. Would do MRIs. Thanks

## 2019-11-22 NOTE — Telephone Encounter (Signed)
Pt called and informed that we can switch to [facility] and see if this helps her better. The first dose will be 240mg  (2 injections), then 120mg  (1 dose) every month after. She is due for annual MRI for f/u of MS. She will need sedation and will need appt before we can order this. Pt scheduled to come Friday at 4 pm

## 2019-11-22 NOTE — Telephone Encounter (Signed)
1. Patient called to report her MS is flaring up in her side. The pain is located on the right side and she has been checked for kidney stones by her PCP  2. Aimovig is not working for her migraine.

## 2019-11-22 NOTE — Telephone Encounter (Signed)
aimovig not working cant do anything gets daughter ready for school then cant do anything else.. headache at 6 Side feels like kidney stone, she went to urology, not stones pain in side started oct 25th back pain controled with injections pain not coming from her back.  Shaking like she cold but not, arm and leg weakness cant hold her daughter at times, uses her cane to walk. She stated that the hospital told her that the hospital told her they will not treat her unless [name] states that they can treat her.

## 2019-11-24 ENCOUNTER — Telehealth: Payer: Self-pay | Admitting: Neurology

## 2019-11-24 NOTE — Telephone Encounter (Signed)
Patient saw her PCP today and was told by PCP that she doesn't qualify for home health aide but wants to wait for MRI and Dr Aquino's evaluation. Patient states she is having weakness in her arms & legs, her fingers are numb- so she drops a lot of things that she tries to pick up, and she also reports having some unexplained pain. Patient reports that her parents were helping her, but her mom passed away in 2022/03/03 and her dad isn't able to help her as much as he used to. She is wanting [name] to evaluate her at her upcoming appointment to see if she qualifies for a home health aide and to review the notes with her PCP, although she doesn't feel like they did a proper exam.

## 2019-11-25 ENCOUNTER — Ambulatory Visit
Admission: RE | Admit: 2019-11-25 | Discharge: 2019-11-25 | Disposition: A | Discharge status: Home / Self Care | Payer: Medicare Other | Source: Ambulatory Visit | Attending: Pain Medicine | Admitting: Pain Medicine

## 2019-11-25 ENCOUNTER — Other Ambulatory Visit: Payer: Self-pay

## 2019-11-25 DIAGNOSIS — M25562 Pain in left knee: Secondary | ICD-10-CM | POA: Insufficient documentation

## 2019-11-25 DIAGNOSIS — G8929 Other chronic pain: Secondary | ICD-10-CM

## 2019-11-25 DIAGNOSIS — M25362 Other instability, left knee: Secondary | ICD-10-CM | POA: Diagnosis present

## 2019-11-26 ENCOUNTER — Encounter: Payer: Self-pay | Admitting: Neurology

## 2019-11-26 ENCOUNTER — Ambulatory Visit (INDEPENDENT_AMBULATORY_CARE_PROVIDER_SITE_OTHER): Payer: Medicare Other | Admitting: Neurology

## 2019-11-26 VITALS — BP 148/97 | HR 103 | Ht 64.0 in | Wt 258.4 lb

## 2019-11-26 DIAGNOSIS — G35 Multiple sclerosis: Secondary | ICD-10-CM

## 2019-11-26 DIAGNOSIS — G5603 Carpal tunnel syndrome, bilateral upper limbs: Secondary | ICD-10-CM

## 2019-11-26 DIAGNOSIS — G43109 Migraine with aura, not intractable, without status migrainosus: Secondary | ICD-10-CM

## 2019-11-26 DIAGNOSIS — F445 Conversion disorder with seizures or convulsions: Secondary | ICD-10-CM | POA: Diagnosis not present

## 2019-11-26 NOTE — Progress Notes (Signed)
NEUROLOGY FOLLOW UP OFFICE NOTE  Ashlan L Pallone [id number] 08-16-1980  HISTORY OF PRESENT ILLNESS: I had the pleasure of seeing Emily [name] in follow-up in the neurology clinic on 11/26/2019.  The patient was last seen 8 months ago for MS, migraines, psychogenic non-epileptic events. Since her last visit, she had called to report worsening migraines on Aimovig, as well as concerns for MS flare. Due to worsening migraines, the only thing she can do is get her daughter ready for school then nothing else. She is concerned the pain on her right side is due to MS, she saw Urology and kidney stones have been ruled out. She reports her back pain is controlled with injections and pain is not from her back. She is having more numbness in her hands, L>R. She has a hard time opening jars, her arms are weak and shaky sometimes. Her legs give out, they feel like rubber/"bouncy." She has noticed a little more urinary incontinence. She had a psychogenic seizure last 8/27 and when she came to, she could hear EMS being told by her family that she was a drug addict and "faking it," reporting that she could not move/feel her legs after the event. She used to have a home aide assisting around the house. Now that her mother has passed away, her father cannot help much around the house. She continues on Gilenya for MS, no side effects. Aside from [place], she is also on Depakote 500mg  qhs and Topiramate 150mg  BID for migraine prophylaxis.   MRI brain, cervical and thoracic spine with and without contrast in October 2020 did not show any new lesions, no abnormal enhancement. Stable findings with multiple scatted foci of FLAIR signal in the periventricular, deep, and juxtacortical white matter, subtle patchy cord signal abnormality at C2-4. She has degenerative disc osteophyte at C6-7 with moderate spinal stenosis, mild mass effect on the left greater than right spinal cord, advanced degenerative disc osteophyte at C5-6 with  mild spinal stenosis and mild mass effect on left hemicord. Thoracic spine showed question subtle patchy cord signal abnormality at level of T6-7, T8-9, T9-10, no active demyelination. Mild degenerative spondylosis with no stenosis.   History on Initial Assessment 03/28/2015: This is a pleasant 39 yo RH woman with a history of relapsing-remitting multiple sclerosis, headaches, anxiety with panic attacks, conversion disorder with seizures. She presented at [redacted] weeks pregnant to determine if diazepam for anxiety/seizures would be a medication she can use, understanding this is pregnancy category D versus risks of injuring herself from falls when she has the psychogenic seizures. Records from her previous neurologists at [facility] were reviewed. She was diagnosed with MS in June 2008 while living in [place] state, she started having right thigh pain and right eye blurred vision. She was diagnosed with right optic neuritis and underwent MRI brain and lumbar puncture which confirmed a diagnosis of MS. 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 She has been on Gilenya since 2014 without any relapse. This was discontinued in December 2016 when she found out she was pregnant. She has a history of chronic pain and migraines, and was taking Depakote and Topamax for headache prophylaxis, also stopped in December 2016. She reports having Botox for her migraines, but so far migraines are not as bad as they were.  She started having shaking episodes in April 2016. Initially Depakote dose was  increased to 500mg  BID. She was back in May 2016 with multiple episodes of syncope, palpitations, a sensation of her heart jumping out of her chest. She also reported lightheadedness, facial numbness, and numbness in her hands and feet. On her last visit in June 2016 at [facility], shaking  episodes were discussed. Events always occur after a stressful event. She had a spell in [place] where she had a normal EEG and was diagnosed with a conversion disorder. She had seen Dr. Darleene Cleaver and reportedly told her he could not help her because "her case was too complex." She started seeing a psychiatrist in [place], however since she switched neurologists, she reports that he cannot see her anymore. She has not been able to establish care with [facility] and continues to have anxiety and panic attacks "all the time." She is diaphoretic in the office today and reports she always feels anxious. When she starts having one of her seizures, she would have significant anxiety, "my body can't deal and goes into a seizure," she hears her heartbeat in her ears. If it slowly builds up, she can get herself to a safe place, however other times it "just hits me," and she falls to the ground and has injured herself. She reports falling out one time in the bathroom during a doctor visit. She is currently on Fluoxetine and Buspirone. Clonazepam in the past did not help. She would usually go to the ER for these spells, which would quiet down once she gets Benadryl and Diazepam.  She delivered early at [redacted] weeks gestation last July 12, 2015. A week after delivery, she started having numbness on the top of both feet, pain in the balls of her feet, with numbness traveling up to her mid-back. She also has numbness in both forearms, and reports her palms feel the same as her soles, with constant stabbing burning pain. She had these symptoms pre-pregnancy but states the numbness only went up to her knees. During pregnancy, all her symptoms were less bothersome. She has been following with Pain management in [facility] and would occasionally take Oxycodone prior to her pregnancy.  Diagnostic Data:  I personally reviewed MRI brain with and without contrast done with [facility] last 10/02/13 which did not show any acute changes,  there was multiple T2/FLAIR lesions in the subcortical and deep white matter regions, no abnormal enhancement. Hippocampi appear asymmetric, smaller on the right without abnormal signal or enhancement.  MRI C-spine with and without contrast 10/18/13 showed hazy T2 hyperintensity within the cord from C2-3 to C4-5 without abnormal enhancement.  per [facility] - MRI brain done 03/18/14: "No change in numerous multifocal T2 hyperintense white matter lesions relative to prior MRI from 04/17/2013 most consistent with demyelinating lesions given history of multiple sclerosis. No evidence of active demyelination."  MRI cervical spine done 03/18/14: "Relative to prior MRI from 10/20/2009, no change in patchy foci of increased T2 signal in the dorsal cord at C2- C4 in keeping with demyelinating lesions in this patient with a clinical history of multiple sclerosis.  No evidence of enhancement to suggest active demyelination. Moderately advanced degenerative disease at C5-C6 and C6-C7 with interval development of left eccentric central, foraminal entry zone and foraminal disc extrusion at C6-C7 exerting mass effect on the exiting C7 and descending C8 roots.  MRI brain and C-spine 07/30/2015 with and without contrast : There was no abnormal enhancement. There were no changes from previous MRI brain in 09/2013 with multiple bilateral subcortical greater than 10 periventricular  T2 hyperintensities. There were no cord lesions seen up to T3-4. There was chronic degenerative disc disease and central canal stenosis at C5-6 and C6-7, no abnormal cord signal, unchanged from 06/2014.  EMG/NCV done on both UE and right LE showed bilateral carpal tunnel syndrome, mild to moderate in degree, no evidence of polyneuropathy or radiculopathy on the right side.  PAST MEDICAL HISTORY: Past Medical History:  Diagnosis Date  . ADHD   . Anxiety   . Asthma as a child  . Chronic back pain   . Chronic pain   . Conversion disorder   . DDD  (degenerative disc disease), cervical   . Depression   . External hemorrhoid   . Fatty liver   . Fibromyalgia   . GERD (gastroesophageal reflux disease)   . History of kidney stones   . HTN (hypertension) 02/27/2015  . JC virus antibody positive   . Migraines   . MS (multiple sclerosis) ([place])    06-2006  . Neuromuscular disorder ([place])    MS  . Neuropathy   . OA (osteoarthritis)   . Ovarian cyst   . Panic attack   . Perforated bowel ([place]) 2009   colostomy bag for 3 motnhs  . Pneumonia   . Pseudoseizures ([place])    last seizure was 3 mo ago while taking modafinil  . PTSD (post-traumatic stress disorder)   . PTSD (post-traumatic stress disorder)   . S/P emergency C-section   . Seasonal allergies   . Urinary urgency   . Vertigo   . Wears contact lenses   . Wears glasses     MEDICATIONS: Current Outpatient Medications on File Prior to Visit  Medication Sig Dispense Refill  . AIMOVIG 140 MG/ML SOAJ Inject 140 mg into the muscle every 30 (thirty) days. 1.12 mg 11  . albuterol (PROVENTIL HFA;VENTOLIN HFA) 108 (90 Base) MCG/ACT inhaler Inhale 2 puffs into the lungs every 6 (six) hours as needed for wheezing or shortness of breath. (Patient taking differently: Inhale 2 puffs into the lungs every 4 (four) hours as needed for wheezing or shortness of breath. ) 1 Inhaler 0  . amLODipine (NORVASC) 5 MG tablet Take 5 mg by mouth daily.   2  . cariprazine (VRAYLAR) capsule Take 3 mg by mouth at bedtime.    . diclofenac Sodium (VOLTAREN) 1 % GEL Apply 1 application topically 2 (two) times daily as needed (knee and back pain).     Marland Kitchen diphenhydrAMINE (BENADRYL) 25 MG tablet Take 50 mg by mouth at bedtime.    . divalproex (DEPAKOTE) 500 MG DR tablet Take 1 tablet (500 mg total) by mouth at bedtime. 90 tablet 3  . dronabinol (MARINOL) 2.5 MG capsule Take 1 capsule (2.5 mg total) by mouth 3 (three) times daily. 90 capsule 5  . EPINEPHrine (EPIPEN) 0.3 mg/0.3 mL SOAJ injection Inject 0.3 mg into the  muscle as needed for anaphylaxis.     . Fingolimod HCl (GILENYA) 0.5 MG CAPS Take 1 capsule (0.5 mg total) by mouth daily. 90 capsule 3  . loratadine (CLARITIN) 10 MG tablet Take 10 mg by mouth daily as needed for allergies.    Derrill Memo ON 11/29/2019] methocarbamol (ROBAXIN) 750 MG tablet Take 1 tablet (750 mg total) by mouth 2 (two) times daily as needed for muscle spasms. 60 tablet 2  . omeprazole (PRILOSEC) 20 MG capsule TAKE 1 CAPSULE (20 MG TOTAL) BY MOUTH 2 (TWO) TIMES DAILY BEFORE A MEAL. 180 capsule 1  . potassium chloride SA (  KLOR-CON) 20 MEQ tablet Take 1 tablet (20 mEq total) by mouth daily. 3 tablet 0  . [START ON 11/29/2019] pregabalin (LYRICA) 225 MG capsule Take 1 capsule (225 mg total) by mouth 2 (two) times daily. 60 capsule 2  . topiramate (TOPAMAX) 100 MG tablet Take 1.5 tablets twice a day (Patient taking differently: Take 150 mg by mouth 2 (two) times daily. Take 1.5 tablets twice a day) 270 tablet 3  . traZODone (DESYREL) 50 MG tablet Take 100 mg by mouth at bedtime.     Current Facility-Administered Medications on File Prior to Visit  Medication Dose Route Frequency Provider Last Rate Last Admin  . metoCLOPramide (REGLAN) injection 10 mg  10 mg Intravenous Once Milton Ferguson, MD        ALLERGIES: Allergies  Allergen Reactions  . Baclofen Hives and Shortness Of Breath  . Duloxetine Hcl Shortness Of Breath and Rash  . Gabapentin Shortness Of Breath and Rash  . Monosodium Glutamate Anaphylaxis  . Other Shortness Of Breath, Rash and Other (See Comments)    MSG, beans (vomiting) MSG causes hives, itching, throat swelling  . Acetaminophen Nausea And Vomiting    Projectile vomiting  . Alprazolam Other (See Comments)    Lethargy  . Amitriptyline Other (See Comments) and Hypertension    hypertension  . Magnesium Salicylate Hives and Itching  . Modafinil Other (See Comments)    Seizures   . Rizatriptan Nausea And Vomiting and Other (See Comments)    GI upset,  Projectile vomiting GI upset, Projectile vomiting  . Tizanidine Hives    Other reaction(s): GI Upset (intolerance)  . Vicodin [Hydrocodone-Acetaminophen] Hives, Nausea And Vomiting and Other (See Comments)    Projectile vomiting  . Adhesive [Tape] Other (See Comments)    Skin irritation Paper tape is ok  . Hydrocodone-Acetaminophen Hives and Nausea And Vomiting    Projectile vomiting  . Ketorolac Tromethamine Nausea Only    Toradol   . Lamotrigine Rash  . Nsaids Other (See Comments)    Irritate stomach   . Tramadol Nausea And Vomiting    Other reaction(s): GI Upset (intolerance)    FAMILY HISTORY: Family History  Problem Relation Age of Onset  . Diabetes Mother   . Hypertension Mother   . Diabetes Father   . Hypertension Father   . Arthritis Father   . Cancer Maternal Grandmother   . Cancer Maternal Grandfather   . Alcohol abuse Neg Hx   . Anxiety disorder Neg Hx   . Bipolar disorder Neg Hx   . Drug abuse Neg Hx   . Depression Neg Hx   . Colon cancer Neg Hx     SOCIAL HISTORY: Social History   Socioeconomic History  . Marital status: Single    Spouse name: Not on file  . Number of children: 1  . Years of education: College  . Highest education level: Not on file  Occupational History    Employer: OTHER    Comment: disability  Tobacco Use  . Smoking status: Former Smoker    Packs/day: 0.25    Years: 10.00    Pack years: 2.50    Types: Cigarettes    Quit date: 07/12/2015    Years since quitting: 4.3  . Smokeless tobacco: Never Used  Vaping Use  . Vaping Use: Never used  Substance and Sexual Activity  . Alcohol use: No    Alcohol/week: 0.0 standard drinks  . Drug use: No    Comment: marinol that shows up at  thc  . Sexual activity: Not Currently    Partners: Male    Birth control/protection: None, Implant  Other Topics Concern  . Not on file  Social History Narrative    Born and raised in [place], [place] by parents. Pt has one younger sister. 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 She stopped due to MS and is currently on disability.        Married for less than 1 yr in 1999 that ended in divorce.      Caffeine Use: 1 20oz soda daily      Lives with mom and daughter   Social Determinants of Health   Financial Resource Strain:   . Difficulty of Paying Living Expenses: Not on file  Food Insecurity:   . Worried About Charity fundraiser in the Last Year: Not on file  . Ran Out of Food in the Last Year: Not on file  Transportation Needs:   . Lack of Transportation (Medical): Not on file  . Lack of Transportation (Non-Medical): Not on file  Physical Activity:   . Days of Exercise per Week: Not on file  . Minutes of Exercise per Session: Not on file  Stress:   . Feeling of Stress : Not on file  Social Connections:   . Frequency of Communication with Friends and Family: Not on file  . Frequency of Social Gatherings with Friends and Family: Not on file  . Attends Religious Services: Not on file  . Active Member of Clubs or Organizations: Not on file  . Attends Archivist Meetings: Not on file  . Marital Status: Not on file  Intimate Partner Violence:   . Fear of Current or Ex-Partner: Not on file  . Emotionally Abused: Not on file  . Physically Abused: Not on file  . Sexually Abused: Not on file     PHYSICAL EXAM: Vitals:   11/26/19 1612  BP: (!) 148/97  Pulse: (!) 103  SpO2: 97%   General: No acute distress Head:  Normocephalic/atraumatic Skin/Extremities: No rash, no edema Neurological Exam: alert and oriented to person, place, and time. No aphasia or dysarthria. Fund of knowledge is appropriate.  Recent and remote memory are intact.  Attention and concentration are normal.   Cranial nerves: Pupils equal, round. Extraocular movements intact with no nystagmus. Visual fields full.  No facial asymmetry.  Motor: Bulk and tone normal, muscle strength 5/5 on both UE except for 4/5 bilateral  APB (with some give way weakness), 5/5 both LE.  Finger to nose testing intact.Reflexes +2 throughout. Gait slow and cautious with cane No resting tremor, bilateral postural and endpoint tremor.   IMPRESSION: This is a 39 yo RH woman with a history of relapsing-remitting multiple sclerosis, migraine with aura, anxiety with panic attacks, conversion disorder with psychogenic seizures. She is reporting more weakness in her hands and legs, as well as more urinary incontinence. She is due for annual interval MRI surveillance for MS, MRI brain, cervical, and thoracic spine with and without contrast will be ordered, she will need sedation due to significant claustrophobia. Continue Gilenya. Hand weakness and paresthesias likely due to worsening carpal tunnel syndrome, repeat EMG/NCV of both upper extremities. She will be referred for PT and OT for bilateral hand and leg weakness. She is requesting for home health as well. She is reporting more migraines on Aimovig, we will switch to [facility] at the beginning of December, continue Depakote 500mg  qhs and Topiramate 150mg   BID.She is also on Lyrica from Pain Management. Continue follow-up with Psychiatry and Pain Management. She does not drive. Follow-up in 6 months, she knows to call for any changes.    Thank you for allowing me to participate in her care.  Please do not hesitate to call for any questions or concerns.   Ellouise Newer, M.D.   CC: Dr. Huel Cote

## 2019-11-26 NOTE — Patient Instructions (Signed)
1. Schedule MRI brain with and without contrast, cervical spine with and without contrast, then thoracic spine with and without contrast  2. Schedule EMG/NCV of both UE  3. We will plan to start Emgality the first of December  4. Referral will be sent for Home health with PT and OT  5. Continue all your other medications  6. Follow-up in 6 months, call for any changes

## 2019-11-28 ENCOUNTER — Other Ambulatory Visit: Payer: Self-pay | Admitting: Gastroenterology

## 2019-11-29 ENCOUNTER — Telehealth: Payer: Self-pay | Admitting: Neurology

## 2019-11-29 ENCOUNTER — Telehealth: Payer: Self-pay | Admitting: Internal Medicine

## 2019-11-29 NOTE — Telephone Encounter (Signed)
Pt needs to speak to someone about the MRI that [name] wants to have her done. The MRI will take 3 hours but she will be there for 6 hours.   Please call

## 2019-11-29 NOTE — Telephone Encounter (Signed)
Patient called and said that [facility] in [place] sent a refill request in for her Dranovinal (?) yesterday   Wanted to make sure we received it

## 2019-11-29 NOTE — Telephone Encounter (Signed)
Left a detailed message for pt. Medication was sent into pts pharmacy today by Neil Crouch, PA.

## 2019-11-29 NOTE — Telephone Encounter (Signed)
Pt called in crying stated she does not have child care to get her daughter to school when she needs to have her MRI and that her dad will not stay 6 hrs for her to have all 3 asking if we can break it up even if she has to have 3 different ones where is put to sleep

## 2019-11-29 NOTE — Progress Notes (Signed)
Patient: Emily [name]  Service Category: E/M  Provider: Gaspar Cola, MD  DOB: 14-Nov-1980  DOS: 12/01/2019  Location: Office  MRN: [id number]  Setting: Ambulatory outpatient  Referring Provider: Leeanne Rio, MD  Type: Established Patient  Specialty: Interventional Pain Management  PCP: Leeanne Rio, MD  Location: Remote location  Delivery: TeleHealth     Virtual Encounter - Pain Management PROVIDER NOTE: Information contained herein reflects review and annotations entered in association with encounter. Interpretation of such information and data should be left to medically-trained personnel. Information provided to patient can be located elsewhere in the medical record under "Patient Instructions". Document created using STT-dictation technology, any transcriptional errors that may result from process are unintentional.    Contact & Pharmacy Preferred: [phone] Home: [phone] (home) Mobile: [phone] (mobile) E-mail: [email or web address]  [id number] - [place], [place] [address] [place] [address] Phone: [phone] Fax: [place], [place] - [address] [place] [address] Phone: [phone] Fax: [phone]   Pre-screening  Emily [name] offered "in-person" vs "virtual" encounter. She indicated preferring virtual for this encounter.   Reason COVID-19*  Social distancing based on CDC and AMA recommendations.   I contacted [place] on 12/01/2019 via telephone.      I clearly identified myself as Gaspar Cola, MD. I verified that I was speaking with the correct person using two identifiers (Name: Emily [name], and date of birth: [birth year]).  Consent I sought verbal advanced consent from [place] for virtual visit interactions. I informed Emily [name] of possible security and privacy concerns, risks, and  limitations associated with providing "not-in-person" medical evaluation and management services. I also informed Emily [name] of the availability of "in-person" appointments. Finally, I informed her that there would be a charge for the virtual visit and that she could be  personally, fully or partially, financially responsible for it. Ms. Tuel expressed understanding and agreed to proceed.   Historic Elements   Emily [name] is a 39 y.o. year old, female patient evaluated today after our last contact on 11/02/2019. Emily [name]  has a past medical history of ADHD, Anxiety, Asthma (as a child), Chronic back pain, Chronic pain, Conversion disorder, DDD (degenerative disc disease), cervical, Depression, External hemorrhoid, Fatty liver, Fibromyalgia, GERD (gastroesophageal reflux disease), History of kidney stones, HTN (hypertension) (02/27/2015), JC virus antibody positive, Migraines, MS (multiple sclerosis) ([place]), Neuromuscular disorder ([place]), Neuropathy, OA (osteoarthritis), Ovarian cyst, Panic attack, Perforated bowel ([place]) (2009), Pneumonia, Pseudoseizures ([place]), PTSD (post-traumatic stress disorder), PTSD (post-traumatic stress disorder), S/P emergency C-section, Seasonal allergies, Urinary urgency, Vertigo, Wears contact lenses, and Wears glasses. She also  has a past surgical history that includes Extremity cyst excision (1994); Bowel resection (01/2007); Colostomy closure (04/2007); Scar revision (01/21/2011); Hernia repair; Abdominal surgery; Appendectomy; Cesarean section (N/A, 07/12/2015); Incisional hernia repair (N/A, 02/16/2016); Insertion of mesh (02/16/2016); Tooth Extraction (Left, 10/2016); Colonoscopy with propofol (N/A, 01/29/2017); Radiology with anesthesia (N/A, 03/06/2017); Exam under anesthesia with hemorrhoidectomy (N/A, 06/11/2018); Excisional hemorrhoidectomy; Transrectal drainage of pelvic abscess; Radiology with anesthesia (N/A, 09/15/2018); Radiology with anesthesia (N/A, 10/13/2018); Radiology with  anesthesia (N/A, 10/26/2018); Colon surgery; ABDOMINAL WOUND EXPLORATION  (03/19/2019); laparotomy (03/19/2019); laparoscopy (N/A, 06/14/2019); Lysis of adhesion (N/A, 06/14/2019); laparotomy (N/A, 06/14/2019); and Bowel resection (N/A, 06/14/2019). Emily [name] has a current medication list which includes the following prescription(s): aimovig, albuterol, amlodipine, cariprazine, diclofenac sodium, diphenhydramine,  divalproex, dronabinol, epinephrine, gilenya, loratadine, methocarbamol, omeprazole, potassium chloride sa, pregabalin, topiramate, and trazodone, and the following Facility-Administered Medications: metoclopramide. She  reports that she quit smoking about 4 years ago. Her smoking use included cigarettes. She has a 2.50 pack-year smoking history. She has never used smokeless tobacco. She reports that she does not drink alcohol and does not use drugs. Emily [name] is allergic to baclofen, duloxetine hcl, gabapentin, monosodium glutamate, other, acetaminophen, alprazolam, amitriptyline, magnesium salicylate, modafinil, rizatriptan, tizanidine, vicodin [hydrocodone-acetaminophen], adhesive [tape], hydrocodone-acetaminophen, ketorolac tromethamine, lamotrigine, nsaids, and tramadol.   HPI  Today, she is being contacted for follow-up evaluation after left knee MRI done on 11/26/2019.  According to this MRI the impression is: 1. Mild osteoarthritis of the left knee most pronounced within the patellofemoral compartment. 2. Partial-thickness fissure with small flap component at the anterior aspect of the medial femoral condyle. 3. Mild tendinosis* of the distal quadriceps and proximal patellar tendons. 4. Small knee joint effusion. 5. Intact menisci and ligamentous structures.  * Tendinosis is a degeneration of the tendon's collagen in response to chronic overuse; when overuse is continued without giving the tendon time to heal and rest, such as with repetitive strain injury, tendinosis results.  Nonopioids  transferred 10/25/2019: Robaxin and Lyrica  Today we went over the results of the MRI and we have sent the patient for an orthopedic surgery consult.  She has [place] somebody in the [place].  Pharmacotherapy Assessment  Analgesic: None from our practice due to benzodiazepine use and high risk. The patient is also taking Marinol.   Monitoring: [place] PMP: PDMP reviewed during this encounter.       Pharmacotherapy: No side-effects or adverse reactions reported. Compliance: No problems identified. Effectiveness: Clinically acceptable. Plan: Refer to "POC".  UDS:  Summary  Date Value Ref Range Status  03/16/2018 FINAL  Final    Comment:    ==================================================================== TOXASSURE COMP DRUG ANALYSIS,UR ==================================================================== Test                             Result       Flag       Units Drug Present and Declared for Prescription Verification   Desmethyldiazepam              214          EXPECTED   ng/mg creat   Oxazepam                       1241         EXPECTED   ng/mg creat   Temazepam                      1052         EXPECTED   ng/mg creat    Desmethyldiazepam, oxazepam, and temazepam are expected    metabolites of diazepam. Desmethyldiazepam and oxazepam are also    expected metabolites of other drugs, including chlordiazepoxide,    prazepam, clorazepate, and halazepam. Oxazepam is an expected    metabolite of temazepam. Oxazepam and temazepam are also    available as scheduled prescription medications.   Pregabalin                     PRESENT      EXPECTED   Topiramate  PRESENT      EXPECTED   Methocarbamol                  PRESENT      EXPECTED   Fluoxetine                     PRESENT      EXPECTED   Norfluoxetine                  PRESENT      EXPECTED    Norfluoxetine is an expected metabolite of fluoxetine. Drug Absent but Declared for Prescription  Verification   Prochlorperazine               Not Detected UNEXPECTED   Ibuprofen                      Not Detected UNEXPECTED    Ibuprofen, as indicated in the declared medication list, is not    always detected even when used as directed. ==================================================================== Test                      Result    Flag   Units      Ref Range   Creatinine              29               mg/dL      >=20 ==================================================================== Declared Medications:  The flagging and interpretation on this report are based on the  following declared medications.  Unexpected results may arise from  inaccuracies in the declared medications.  **Note: The testing scope of this panel includes these medications:  Diazepam (Valium)  Fluoxetine (Prozac)  Methocarbamol (Robaxin)  Pregabalin (Lyrica)  Prochlorperazine (Compazine)  Topiramate (Topamax)  **Note: The testing scope of this panel does not include small to  moderate amounts of these reported medications:  Ibuprofen  **Note: The testing scope of this panel does not include following  reported medications:  Albuterol  Amlodipine (Norvasc)  Buspirone (BuSpar)  Divalproex (Depakote)  Epinephrine (EpiPen)  Etonogestrel (Nexplanon)  Fingolimod (Gilenya)  Lithium (Lithobid)  Loratadine (Claritin)  Multivitamin  Ranitidine (Zantac) ==================================================================== For clinical consultation, please call [phone]. ====================================================================     Laboratory Chemistry Profile   Renal Lab Results  Component Value Date   BUN 8 06/15/2019   CREATININE 0.70 06/15/2019   LABCREA 292.00 07/12/2015   BCR 12 08/13/2018   GFRAA >60 06/15/2019   GFRNONAA >60 06/15/2019     Hepatic Lab Results  Component Value Date   AST 18 06/07/2019   ALT 20 06/07/2019   ALBUMIN 3.8 06/07/2019   ALKPHOS 82  06/07/2019   LIPASE 41 07/12/2015     Electrolytes Lab Results  Component Value Date   NA 140 06/15/2019   K 3.7 06/15/2019   CL 105 06/15/2019   CALCIUM 8.8 (L) 06/15/2019   MG 2.2 03/16/2018     Bone Lab Results  Component Value Date   25OHVITD1 20 (L) 03/16/2018   25OHVITD2 4.7 03/16/2018   25OHVITD3 15 03/16/2018     Inflammation (CRP: Acute Phase) (ESR: Chronic Phase) Lab Results  Component Value Date   CRP 6 03/16/2018   ESRSEDRATE 20 03/16/2018   LATICACIDVEN 1.0 11/12/2018       Note: Above Lab results reviewed.  Imaging  MR KNEE LEFT WO CONTRAST CLINICAL DATA:  Chronic knee pain, worsening the previous 6  months  EXAM: MRI OF THE LEFT KNEE WITHOUT CONTRAST  TECHNIQUE: Multiplanar, multisequence MR imaging of the knee was performed. No intravenous contrast was administered.  COMPARISON:  08/02/2016  FINDINGS: MENISCI  Medial meniscus:  Intact.  Lateral meniscus:  Intact.  LIGAMENTS  Cruciates:  Intact ACL and PCL.  Collaterals: Medial collateral ligament is intact. Lateral collateral ligament complex is intact.  CARTILAGE  Patellofemoral: Thinning and surface irregularity of the lateral patellar facet with focal near full-thickness 4 mm cartilage defect (series 8, image 9). No trochlear cartilage defect.  Medial: Partial-thickness fissure with small flap component at the far anterior aspect of the medial femoral condyle (series 11, image 21).  Lateral:  No chondral defect.  Joint:  Small knee joint effusion.  Fat pads within normal limits.  Popliteal Fossa: No Baker cyst. Intact popliteus tendon. Small multilobulated ganglia track along the popliteus myotendinous junction.  Extensor Mechanism: Intact quadriceps tendon and patellar tendon. Mild tendinosis of the distal quadriceps and proximal patellar tendons.  Bones: Lateralization of the patella without dislocation. TT-TG distance of 13 mm. No acute fracture. No dislocation. No  suspicious bone lesion.  Other: 1.8 cm ganglion cyst at the posterior aspect of the lower leg associated with the lateral gastrocnemius origin.  IMPRESSION: 1. Mild osteoarthritis of the left knee most pronounced within the patellofemoral compartment. 2. Partial-thickness fissure with small flap component at the anterior aspect of the medial femoral condyle. 3. Mild tendinosis of the distal quadriceps and proximal patellar tendons. 4. Small knee joint effusion. 5. Intact menisci and ligamentous structures.  Electronically Signed   By: Davina Poke D.O.   On: 11/26/2019 14:41  Assessment  The primary encounter diagnosis was Chronic pain syndrome. 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  Plan of Care  Problem-specific:  No problem-specific Assessment & Plan notes found for this encounter.  Emily [name] has a current medication list which includes the following long-term medication(s): albuterol, divalproex, gilenya, methocarbamol, omeprazole, potassium chloride sa, pregabalin, topiramate, and trazodone.  Pharmacotherapy (Medications Ordered): No orders of the defined types were placed in this encounter.  Orders:  Orders Placed This Encounter  Procedures  . Ambulatory referral to Orthopedic Surgery    Referral Priority:   Routine    Referral Type:   Surgical    Referral Reason:   Specialty Services Required    Requested Specialty:   Orthopedic Surgery    Number of Visits Requested:   1   Follow-up plan:   No follow-ups on file.      Interventional management options: Considering:   NOTE: NO Lumbar RFA until BMI<35. Diagnosticmidline thoracic  ESI Diagnosticbilateral thoracic facet block  Possiblebilateral thoracic RFA Diagnostic right sided LESI Therapeutic bilateral lumbar facet RFA #1  Diagnosticleft knee genicular NB Possibleleft genicular nerve RFA  Diagnosticright-sided cervical facet RFA Diagnosticright occipital NB   Completed treatment(s):   Diagnostic right cervical ESI x3 (11/12/2016; 12/10/2016; 12/24/2016) Diagnostic right cervical ESI x1 (01/23/2017) Diagnostic left lumbar facet block x2 (03/04/2017; 03/24/2018; 07/09/2018; 08/31/2019) Diagnostic right lumbar facet block x1 (03/24/2018; 07/09/2018; 08/31/2019) Diagnostic bilateral cervical facet block x1 (no steroids) x1 (05/27/2017) Diagnostic bilateral IA knee joint injection x1 (w/ steroids) (11/02/2019)   Palliative PRN treatment(s):   Palliative  bilateral lumbar facet block #3     Recent Visits Date Type Provider Dept  11/18/19 Telemedicine Milinda Pointer, MD [facility]  11/02/19 Procedure visit Milinda Pointer, MD [facility]  10/25/19 Office Visit Milinda Pointer, MD [facility]  Showing recent visits within past 90 days and meeting all other requirements Today's Visits Date Type Provider Dept  12/01/19 Telemedicine Milinda Pointer, MD [facility]  Showing today's visits and meeting all other requirements Future Appointments No visits were found meeting these conditions. Showing future appointments within next 90 days and meeting all other requirements  I discussed the assessment and treatment plan with the patient. The patient was provided an opportunity to ask questions and all were answered. The patient agreed with the plan and demonstrated an understanding of the instructions.  Patient advised to call back or seek an in-person evaluation if the symptoms or condition worsens.  Duration of encounter: 15 minutes.  Note by: Gaspar Cola, MD Date: 12/01/2019; Time: 2:54 PM

## 2019-11-29 NOTE — Telephone Encounter (Signed)
Pls let her know that I understand her situation however it is not safe to do recurrent intubation, this increases the risk for trauma and swelling of her throat, vocal cord paralysis, and pneumonia. The risks are too great that I cannot recommend splitting them up.

## 2019-11-30 ENCOUNTER — Encounter: Payer: Self-pay | Admitting: Pain Medicine

## 2019-11-30 NOTE — Telephone Encounter (Signed)
Pt called and informed that [name] understands her situation however it is not safe to do recurrent intubation, this increases the risk for trauma and swelling of her throat, vocal cord paralysis, and pneumonia. The risks are too great that [name] cannot recommend splitting them up. Pt will work on getting child care so she can schedule her MRI

## 2019-12-01 ENCOUNTER — Ambulatory Visit: Payer: Medicare Other | Attending: Pain Medicine | Admitting: Pain Medicine

## 2019-12-01 ENCOUNTER — Other Ambulatory Visit: Payer: Self-pay

## 2019-12-01 DIAGNOSIS — M25561 Pain in right knee: Secondary | ICD-10-CM

## 2019-12-01 DIAGNOSIS — G35 Multiple sclerosis: Secondary | ICD-10-CM

## 2019-12-01 DIAGNOSIS — G894 Chronic pain syndrome: Secondary | ICD-10-CM

## 2019-12-01 DIAGNOSIS — R202 Paresthesia of skin: Secondary | ICD-10-CM

## 2019-12-01 DIAGNOSIS — G379 Demyelinating disease of central nervous system, unspecified: Secondary | ICD-10-CM

## 2019-12-01 DIAGNOSIS — M1712 Unilateral primary osteoarthritis, left knee: Secondary | ICD-10-CM

## 2019-12-01 DIAGNOSIS — M79602 Pain in left arm: Secondary | ICD-10-CM

## 2019-12-01 DIAGNOSIS — G35D Multiple sclerosis, unspecified: Secondary | ICD-10-CM

## 2019-12-01 DIAGNOSIS — M25562 Pain in left knee: Secondary | ICD-10-CM

## 2019-12-01 DIAGNOSIS — M4802 Spinal stenosis, cervical region: Secondary | ICD-10-CM | POA: Diagnosis not present

## 2019-12-01 DIAGNOSIS — M542 Cervicalgia: Secondary | ICD-10-CM

## 2019-12-01 DIAGNOSIS — M79601 Pain in right arm: Secondary | ICD-10-CM

## 2019-12-01 DIAGNOSIS — G8929 Other chronic pain: Secondary | ICD-10-CM

## 2019-12-03 ENCOUNTER — Ambulatory Visit: Payer: Medicare Other | Admitting: Gastroenterology

## 2019-12-06 DIAGNOSIS — M5030 Other cervical disc degeneration, unspecified cervical region: Secondary | ICD-10-CM | POA: Diagnosis not present

## 2019-12-06 DIAGNOSIS — G5603 Carpal tunnel syndrome, bilateral upper limbs: Secondary | ICD-10-CM | POA: Diagnosis not present

## 2019-12-06 DIAGNOSIS — F419 Anxiety disorder, unspecified: Secondary | ICD-10-CM | POA: Diagnosis not present

## 2019-12-06 DIAGNOSIS — F4310 Post-traumatic stress disorder, unspecified: Secondary | ICD-10-CM | POA: Diagnosis not present

## 2019-12-06 DIAGNOSIS — G35 Multiple sclerosis: Secondary | ICD-10-CM | POA: Diagnosis not present

## 2019-12-06 DIAGNOSIS — G43101 Migraine with aura, not intractable, with status migrainosus: Secondary | ICD-10-CM | POA: Diagnosis not present

## 2019-12-06 DIAGNOSIS — F445 Conversion disorder with seizures or convulsions: Secondary | ICD-10-CM | POA: Diagnosis not present

## 2019-12-06 DIAGNOSIS — G8929 Other chronic pain: Secondary | ICD-10-CM | POA: Diagnosis not present

## 2019-12-06 DIAGNOSIS — Z87891 Personal history of nicotine dependence: Secondary | ICD-10-CM | POA: Diagnosis not present

## 2019-12-07 ENCOUNTER — Telehealth: Payer: Self-pay | Admitting: *Deleted

## 2019-12-07 ENCOUNTER — Encounter: Payer: Self-pay | Admitting: *Deleted

## 2019-12-07 ENCOUNTER — Encounter: Payer: Self-pay | Admitting: Gastroenterology

## 2019-12-07 ENCOUNTER — Ambulatory Visit (INDEPENDENT_AMBULATORY_CARE_PROVIDER_SITE_OTHER): Payer: Medicare Other | Admitting: Gastroenterology

## 2019-12-07 ENCOUNTER — Encounter: Payer: Self-pay | Admitting: Internal Medicine

## 2019-12-07 ENCOUNTER — Other Ambulatory Visit: Payer: Self-pay

## 2019-12-07 ENCOUNTER — Telehealth: Payer: Self-pay | Admitting: Neurology

## 2019-12-07 VITALS — BP 155/106 | HR 93 | Temp 96.9°F | Ht 64.0 in | Wt 251.2 lb

## 2019-12-07 DIAGNOSIS — K219 Gastro-esophageal reflux disease without esophagitis: Secondary | ICD-10-CM

## 2019-12-07 DIAGNOSIS — R110 Nausea: Secondary | ICD-10-CM

## 2019-12-07 MED ORDER — FAMOTIDINE 40 MG PO TABS: 40.0000 mg | ORAL_TABLET | Freq: Every day | ORAL | 3 refills | Status: AC

## 2019-12-07 NOTE — Telephone Encounter (Signed)
Pt called no answer left a voice mail for pt to call the office back  

## 2019-12-07 NOTE — Telephone Encounter (Signed)
Called pt and LMOVM regarding pre-op/covid test appt.

## 2019-12-07 NOTE — Patient Instructions (Signed)
I am glad the nausea is better!  Continue Prilosec twice a day, 30 minutes before meals. You may take Pepcid at bedtime as needed. I sent this to the pharmacy.  We are arranging an upper endoscopy to make sure there is nothing underlying that could be contributing to the nausea!  I enjoyed seeing you again today! As you know, I value our relationship and want to provide genuine, compassionate, and quality care. I welcome your feedback. If you receive a survey regarding your visit,  I greatly appreciate you taking time to fill this out. See you next time!  Annitta Needs, PhD, ANP-BC [facility]

## 2019-12-07 NOTE — Progress Notes (Signed)
Referring Provider: Leeanne Rio, MD Primary Care Physician:  Leeanne Rio, MD Primary GI: Dr. Gala Romney  Chief Complaint  Patient presents with  . Nausea    better, Marinol is helping  . Gastroesophageal Reflux    acid comes up in throat    HPI:   Emily [name] is a 39 y.o. female presenting today with a history of chronic nausea, present since MS diagnosis. Remote history of EGD at [facility] 15 years ago. Previously onZofran, Reglan, Compazine, Phenergan. Marinol has helped in the past. In light of the pandemic and other health issues, it was difficult to see her in person. This is the first I have seen her physically in the office to discuss EGD. GES normal recently.  No dysphagia. No abdominal pain. No constipation or diarrhea. No rectal bleeding. Nocturnal GERD. Omeprazole BID. Not eating late at night. Avoiding NSAIDs. GERD intermittently despite BID PPI. Nausea is better but still present. She is agreeable to EGD to rule out any underlying issues contributing.   Past Medical History:  Diagnosis Date  . ADHD   . Anxiety   . Asthma as a child  . Chronic back pain   . Chronic pain   . Conversion disorder   . DDD (degenerative disc disease), cervical   . Depression   . External hemorrhoid   . Fatty liver   . Fibromyalgia   . GERD (gastroesophageal reflux disease)   . History of kidney stones   . HTN (hypertension) 02/27/2015  . JC virus antibody positive   . Migraines   . MS (multiple sclerosis) ([place])    06-2006  . Neuromuscular disorder ([place])    MS  . Neuropathy   . OA (osteoarthritis)   . Ovarian cyst   . Panic attack   . Perforated bowel ([place]) 2009   colostomy bag for 3 motnhs  . Pneumonia   . Pseudoseizures ([place])    last seizure was 3 mo ago while taking modafinil  . PTSD (post-traumatic stress disorder)   . PTSD (post-traumatic stress disorder)   . S/P emergency C-section   . Seasonal allergies   . Urinary urgency   . Vertigo   . Wears  contact lenses   . Wears glasses     Past Surgical History:  Procedure Laterality Date  . ABDOMINAL SURGERY    . ABDOMINAL WOUND EXPLORATION   03/19/2019   Abdominal Wound Exploration with Explantation of Mesh  . APPENDECTOMY    . BOWEL RESECTION  01/2007   with colostomy  . BOWEL RESECTION N/A 06/14/2019   Procedure: SMALL BOWEL RESECTION;  Surgeon: Ralene Ok, MD;  Location: [place];  Service: General;  Laterality: N/A;  . CESAREAN SECTION N/A 07/12/2015   Procedure: CESAREAN SECTION;  Surgeon: Guss Bunde, MD;  Location: [place];  Service: Obstetrics;  Laterality: N/A;  . COLON SURGERY     part of colon and intestines removed; colostomy 2008  . COLONOSCOPY WITH PROPOFOL N/A 01/29/2017   Procedure: COLONOSCOPY WITH PROPOFOL;  Surgeon: Ileana Roup, MD;  Location: [facility];  Service: General;  Laterality: N/A;  . COLOSTOMY CLOSURE  04/2007  . EVALUATION UNDER ANESTHESIA WITH HEMORRHOIDECTOMY N/A 06/11/2018   Procedure: ANORECTAL EXAM UNDER ANESTHESIA WITH HEMORRHOIDECTOMY;  Surgeon: Ileana Roup, MD;  Location: [place];  Service: General;  Laterality: N/A;  . EXCISIONAL HEMORRHOIDECTOMY    . EXTREMITY CYST EXCISION  1994   right leg  . HERNIA REPAIR    .  INCISIONAL HERNIA REPAIR N/A 02/16/2016   Procedure: HERNIA REPAIR INCISIONAL;  Surgeon: Fanny Skates, MD;  Location: [facility];  Service: General;  Laterality: N/A;  . INSERTION OF MESH  02/16/2016   Procedure: INSERTION OF MESH;  Surgeon: Fanny Skates, MD;  Location: [facility];  Service: General;;  . LAPAROSCOPY N/A 06/14/2019   Procedure: ATTEMPTED DIAGNOSTIC LAPAROSCOPY;  Surgeon: Ralene Ok, MD;  Location: [place];  Service: General;  Laterality: N/A;  . LAPAROTOMY  03/19/2019   Procedure: Abdominal Wound Exploration with Explantation of Mesh;  Surgeon: Ralene Ok, MD;  Location: [place];  Service: General;;  . LAPAROTOMY N/A 06/14/2019   Procedure: EXPLORATORY LAPAROTOMY;   Surgeon: Ralene Ok, MD;  Location: [place];  Service: General;  Laterality: N/A;  . LYSIS OF ADHESION N/A 06/14/2019   Procedure: LYSIS OF ADHESIONS;  Surgeon: Ralene Ok, MD;  Location: [place];  Service: General;  Laterality: N/A;  . RADIOLOGY WITH ANESTHESIA N/A 03/06/2017   Procedure: MRI WITH ANESTHESIA OF CERVICAL SPINE WITHOUT CONTRAST, MRI OF LUMBAR SPINE WITHOUT CONTRAST;  Surgeon: Radiologist, Medication, MD;  Location: [place];  Service: Radiology;  Laterality: N/A;  . RADIOLOGY WITH ANESTHESIA N/A 09/15/2018   Procedure: MRI OF BRAIN WITH AND WITHOUT CONTRAST;  Surgeon: Radiologist, Medication, MD;  Location: [place];  Service: Radiology;  Laterality: N/A;  . RADIOLOGY WITH ANESTHESIA N/A 10/13/2018   Procedure: MRI OF BRAIN WITH AND WITHOUT CONTRAST;  Surgeon: Radiologist, Medication, MD;  Location: [place];  Service: Radiology;  Laterality: N/A;  . RADIOLOGY WITH ANESTHESIA N/A 10/26/2018   Procedure: MRI UNDER ANESTHESIA; HEAD, CERVICAL AND THORASCIC SPINE;  Surgeon: Radiologist, Medication, MD;  Location: [place];  Service: Radiology;  Laterality: N/A;  . SCAR REVISION  01/21/2011   Procedure: SCAR REVISION;  Surgeon: Hermelinda Dellen;  Location: [place];  Service: Plastics;  Laterality: N/A;  exploration of scar of abdomen and repair of defect  . TOOTH EXTRACTION Left 10/2016  . TRANSRECTAL DRAINAGE OF PELVIC ABSCESS      Current Outpatient Medications  Medication Sig Dispense Refill  . albuterol (PROVENTIL HFA;VENTOLIN HFA) 108 (90 Base) MCG/ACT inhaler Inhale 2 puffs into the lungs every 6 (six) hours as needed for wheezing or shortness of breath. (Patient taking differently: Inhale 2 puffs into the lungs every 4 (four) hours as needed for wheezing or shortness of breath. ) 1 Inhaler 0  . amLODipine (NORVASC) 5 MG tablet Take 5 mg by mouth daily.   2  . amphetamine-dextroamphetamine (ADDERALL XR) 10 MG 24 hr capsule Take 10 mg by mouth daily.    . cariprazine  (VRAYLAR) capsule Take 3 mg by mouth at bedtime.    . diclofenac Sodium (VOLTAREN) 1 % GEL Apply 1 application topically 2 (two) times daily as needed (knee and back pain).     Marland Kitchen diphenhydrAMINE (BENADRYL) 25 MG tablet Take 50 mg by mouth at bedtime as needed.     . divalproex (DEPAKOTE) 500 MG DR tablet Take 1 tablet (500 mg total) by mouth at bedtime. 90 tablet 3  . dronabinol (MARINOL) 2.5 MG capsule TAKE 1 CAPSULE BY MOUTH 3 TIMES A DAY 90 capsule 0  . EPINEPHrine (EPIPEN) 0.3 mg/0.3 mL SOAJ injection Inject 0.3 mg into the muscle as needed for anaphylaxis.     . Fingolimod HCl (GILENYA) 0.5 MG CAPS Take 1 capsule (0.5 mg total) by mouth daily. 90 capsule 3  . loratadine (CLARITIN) 10 MG tablet Take 10 mg by mouth daily  as needed for allergies.    . methocarbamol (ROBAXIN) 750 MG tablet Take 1 tablet (750 mg total) by mouth 2 (two) times daily as needed for muscle spasms. 60 tablet 2  . omeprazole (PRILOSEC) 20 MG capsule TAKE 1 CAPSULE (20 MG TOTAL) BY MOUTH 2 (TWO) TIMES DAILY BEFORE A MEAL. 180 capsule 1  . potassium chloride SA (KLOR-CON) 20 MEQ tablet Take 1 tablet (20 mEq total) by mouth daily. 3 tablet 0  . pregabalin (LYRICA) 225 MG capsule Take 1 capsule (225 mg total) by mouth 2 (two) times daily. 60 capsule 2  . topiramate (TOPAMAX) 100 MG tablet Take 1.5 tablets twice a day (Patient taking differently: Take 150 mg by mouth 2 (two) times daily. Take 1.5 tablets twice a day) 270 tablet 3  . traZODone (DESYREL) 50 MG tablet Take 100 mg by mouth at bedtime.    . famotidine (PEPCID) 40 MG tablet Take 1 tablet (40 mg total) by mouth at bedtime. 90 tablet 3   No current facility-administered medications for this visit.   Facility-Administered Medications Ordered in Other Visits  Medication Dose Route Frequency Provider Last Rate Last Admin  . metoCLOPramide (REGLAN) injection 10 mg  10 mg Intravenous Once Milton Ferguson, MD        Allergies as of 12/07/2019 - Review Complete  12/07/2019  Allergen Reaction Noted  . Baclofen Hives and Shortness Of Breath 01/15/2011  . Duloxetine hcl Shortness Of Breath and Rash 01/15/2011  . Gabapentin Shortness Of Breath and Rash 01/15/2011  . Monosodium glutamate Anaphylaxis 04/17/2013  . Other Shortness Of Breath, Rash, and Other (See Comments) 01/15/2011  . Acetaminophen Nausea And Vomiting 11/12/2018  . Alprazolam Other (See Comments) 01/15/2011  . Amitriptyline Other (See Comments) and Hypertension 01/15/2011  . Magnesium salicylate Hives and Itching 04/17/2013  . Modafinil Other (See Comments) 03/16/2018  . Rizatriptan Nausea And Vomiting and Other (See Comments) 08/17/2013  . Tizanidine Hives 04/17/2013  . Vicodin [hydrocodone-acetaminophen] Hives, Nausea And Vomiting, and Other (See Comments) 01/21/2011  . Adhesive [tape] Other (See Comments) 01/15/2011  . Hydrocodone-acetaminophen Hives and Nausea And Vomiting 01/21/2011  . Ketorolac tromethamine Nausea Only 10/09/2013  . Lamotrigine Rash 08/17/2013  . Nsaids Other (See Comments) 09/08/2018  . Tramadol Nausea And Vomiting 12/04/2013    Family History  Problem Relation Age of Onset  . Diabetes Mother   . Hypertension Mother   . Diabetes Father   . Hypertension Father   . Arthritis Father   . Cancer Maternal Grandmother   . Cancer Maternal Grandfather   . Alcohol abuse Neg Hx   . Anxiety disorder Neg Hx   . Bipolar disorder Neg Hx   . Drug abuse Neg Hx   . Depression Neg Hx   . Colon cancer Neg Hx     Social History   Socioeconomic History  . Marital status: Single    Spouse name: Not on file  . Number of children: 1  . Years of education: College  . Highest education level: Not on file  Occupational History    Employer: OTHER    Comment: disability  Tobacco Use  . Smoking status: Former Smoker    Packs/day: 0.25    Years: 10.00    Pack years: 2.50    Types: Cigarettes    Quit date: 07/12/2015    Years since quitting: 4.4  . Smokeless  tobacco: Never Used  Vaping Use  . Vaping Use: Never used  Substance and Sexual Activity  . Alcohol  use: No    Alcohol/week: 0.0 standard drinks  . Drug use: No    Comment: marinol that shows up at thc  . Sexual activity: Not Currently    Partners: Male    Birth control/protection: None, Implant  Other Topics Concern  . Not on file  Social History Narrative    Born and raised in [place], [place] by parents. Pt has one younger sister. Pt has a Best boy. Pt worked from 1999-2006. She stopped due to MS and is currently on disability.        Married for less than 1 yr in 1999 that ended in divorce.      Caffeine Use: 1 20oz soda daily      Lives with mom and daughter   Social Determinants of Health   Financial Resource Strain:   . Difficulty of Paying Living Expenses: Not on file  Food Insecurity:   . Worried About Charity fundraiser in the Last Year: Not on file  . Ran Out of Food in the Last Year: Not on file  Transportation Needs:   . Lack of Transportation (Medical): Not on file  . Lack of Transportation (Non-Medical): Not on file  Physical Activity:   . Days of Exercise per Week: Not on file  . Minutes of Exercise per Session: Not on file  Stress:   . Feeling of Stress : Not on file  Social Connections:   . Frequency of Communication with Friends and Family: Not on file  . Frequency of Social Gatherings with Friends and Family: Not on file  . Attends Religious Services: Not on file  . Active Member of Clubs or Organizations: Not on file  . Attends Archivist Meetings: Not on file  . Marital Status: Not on file    Review of Systems: Gen: Denies fever, chills, anorexia. Denies fatigue, weakness, weight loss.  CV: Denies chest pain, palpitations, syncope, peripheral edema, and claudication. Resp: Denies dyspnea at rest, cough, wheezing, coughing up blood, and pleurisy. GI: see HPI Derm: Denies rash, itching, dry skin Psych: Denies  depression, anxiety, memory loss, confusion. No homicidal or suicidal ideation.  Heme: Denies bruising, bleeding, and enlarged lymph nodes.  Physical Exam: BP (!) 155/106   Pulse 93   Temp (!) 96.9 F (36.1 C) (Temporal)   Ht 5\' 4"  (1.626 m)   Wt 251 lb 3.2 oz (113.9 kg)   LMP 10/28/2019 (Approximate)   BMI 43.12 kg/m  General:   Alert and oriented. No distress noted. Pleasant and cooperative.  Head:  Normocephalic and atraumatic. Eyes:  Conjuctiva clear without scleral icterus. Mouth:  Mask in place Cardiac: S1 S2 present Lungs: clear bilaterally Abdomen:  +BS, soft, non-tender and non-distended. No rebound or guarding. No HSM or masses noted. Msk:  Symmetrical without gross deformities. Normal posture. Extremities:  Without edema. Neurologic:  Alert and  oriented x4 Psych:  Alert and cooperative. Normal mood and affect.  ASSESSMENT: Emily [name] is a 39 y.o. female presenting today with history of chronic nausea, present since MS diagnosis in the past. Failed Zofran, Reglan, Compazine, Phenergan. In setting of COVID and her health issues, she has not been seen physically here since March 2020. GES was finally completed but normal. Marinol had been started months ago with understanding GES would be needed to document if delayed gastric emptying. She has actually found relief with this.   Occasional breakthrough GERD on Prilosec BID; I have asked her to follow GERD diet  and add Pepcid prn. We discussed EGD in near future to exclude any occult etiology for nausea. I suspect this may be normal and nausea is multifactorial.   PLAN:  Proceed with upper endoscopy by Dr. Gala Romney in near future using Propofol: the risks, benefits, and alternatives have been discussed with the patient in detail. The patient states understanding and desires to proceed.   PPI BID, Pepcid prn  Further recommendations to follow  Return in 6 months   Annitta Needs, PhD, [facility]  Gastroenterology

## 2019-12-07 NOTE — Telephone Encounter (Signed)
Patient called with questions for the nurse. She said, "I scheduled my triple MRI for 02/17/2020 but they told me the most recent office notes will be out of date by then. I'm not sure what I need to do about that."

## 2019-12-08 ENCOUNTER — Telehealth: Payer: Self-pay

## 2019-12-08 NOTE — Telephone Encounter (Signed)
Verbal order was given for PT for 2x for 2 weeks and 1 x for 4 weeks and for social worker

## 2019-12-08 NOTE — Telephone Encounter (Signed)
Pt is scheduled for MRI for 02/17/2020 will have to have another appointment to have paper work done for sedation.

## 2019-12-16 ENCOUNTER — Ambulatory Visit (INDEPENDENT_AMBULATORY_CARE_PROVIDER_SITE_OTHER): Payer: Medicare Other | Admitting: Orthopaedic Surgery

## 2019-12-16 ENCOUNTER — Encounter: Payer: Self-pay | Admitting: Orthopaedic Surgery

## 2019-12-16 ENCOUNTER — Other Ambulatory Visit: Payer: Self-pay

## 2019-12-16 VITALS — BP 124/92 | HR 88 | Ht 64.0 in | Wt 256.0 lb

## 2019-12-16 DIAGNOSIS — M1712 Unilateral primary osteoarthritis, left knee: Secondary | ICD-10-CM | POA: Diagnosis not present

## 2019-12-16 NOTE — Progress Notes (Signed)
Cc'ed to pcp °

## 2019-12-16 NOTE — Progress Notes (Signed)
Office Visit Note   Patient: Emily [name]           Date of Birth: 03/12/1980           MRN: [id number] Visit Date: 12/16/2019              Requested by: Milinda Pointer, [place] [address],  [place] [address] PCP: Leeanne Rio, MD   Assessment & Plan: Visit Diagnoses:  1. Arthropathy of left knee     Plan: We discussed working on some weight loss.  She may need to cut back on multiple steroid shots she has been getting very regularly.  Last knee injection was just 11/25/2019 and we discussed repetitive cortisone injection sometimes speeding up degenerative changes and joints.  She may be a candidate for joint arthroplasty at some point.  Currently her BMI greater than 40 would prevent this.  We reviewed findings on her MRI scan I gave her a copy of the report.  She can work on riding exercise bike and weight loss.  She does have significant cervical spondylosis 2 levels but does not have severe stenosis at this point.  She has been evaluated at [facility] and might need surgery for this at some point.  She can follow-up with me as needed.  We discussed Visco injections.  She has had problems with stomach problems with excessive anti-inflammatories in the past.  Follow-Up Instructions: No follow-ups on file.   Orders:  No orders of the defined types were placed in this encounter.  No orders of the defined types were placed in this encounter.     Procedures: No procedures performed   Clinical Data: No additional findings.   Subjective: Chief Complaint  Patient presents with  . Left Knee - Pain  . Right Knee - Pain    HPI 39 year old female with diagnosis of MS with upcoming triple MRI scan scheduled for evaluation and treatment.  She got cervical spondylosis low back pain problems is had opiate use and states she gets series of epidurals sometimes in her neck for an each side followed by injections lower back similarly with cortisone.  She has had  some associated weight gain with current BMI now 43.  She has bilateral knee problems worse on the left than right with crepitus and pain primarily with patellofemoral joint loading.  She has problems going from sitting standing stepping up.  MRI scan has been obtained with some mild arthritic changes partial-thickness changes laptop opponents on the medial femoral condyle.  She not had any true locking.  Review of Systems all other systems are negative other than as mentioned above.   Objective: Vital Signs: BP (!) 124/92   Pulse 88   Ht 5\' 4"  (1.626 m)   Wt 256 lb (116.1 kg)   BMI 43.94 kg/m   Physical Exam Constitutional:      Appearance: She is well-developed.  HENT:     Head: Normocephalic.     Right Ear: External ear normal.     Left Ear: External ear normal.  Eyes:     Pupils: Pupils are equal, round, and reactive to light.  Neck:     Thyroid: No thyromegaly.     Trachea: No tracheal deviation.  Cardiovascular:     Rate and Rhythm: Normal rate.  Pulmonary:     Effort: Pulmonary effort is normal.  Abdominal:     Palpations: Abdomen is soft.  Skin:    General: Skin is warm  and dry.  Neurological:     Mental Status: She is alert and oriented to person, place, and time.  Psychiatric:        Mood and Affect: Mood and affect normal.        Behavior: Behavior normal.     Ortho Exam patient has crepitus with knee extension worse on the left and right.  It is audible when she goes from standing to sitting.  Negative logroll to the hips.  Patient has some limitation of cervical rotation but not severely painful with rotation.  Patient is amatory without walking aids. Specialty Comments:  No specialty comments available.  Imaging: CLINICAL DATA:  Chronic knee pain, worsening the previous 6 months  EXAM: MRI OF THE LEFT KNEE WITHOUT CONTRAST  TECHNIQUE: Multiplanar, multisequence MR imaging of the knee was performed. No intravenous contrast was  administered.  COMPARISON:  08/02/2016  FINDINGS: MENISCI  Medial meniscus:  Intact.  Lateral meniscus:  Intact.  LIGAMENTS  Cruciates:  Intact ACL and PCL.  Collaterals: Medial collateral ligament is intact. Lateral collateral ligament complex is intact.  CARTILAGE  Patellofemoral: Thinning and surface irregularity of the lateral patellar facet with focal near full-thickness 4 mm cartilage defect (series 8, image 9). No trochlear cartilage defect.  Medial: Partial-thickness fissure with small flap component at the far anterior aspect of the medial femoral condyle (series 11, image 21).  Lateral:  No chondral defect.  Joint:  Small knee joint effusion.  Fat pads within normal limits.  Popliteal Fossa: No Baker cyst. Intact popliteus tendon. Small multilobulated ganglia track along the popliteus myotendinous junction.  Extensor Mechanism: Intact quadriceps tendon and patellar tendon. Mild tendinosis of the distal quadriceps and proximal patellar tendons.  Bones: Lateralization of the patella without dislocation. TT-TG distance of 13 mm. No acute fracture. No dislocation. No suspicious bone lesion.  Other: 1.8 cm ganglion cyst at the posterior aspect of the lower leg associated with the lateral gastrocnemius origin.  IMPRESSION: 1. Mild osteoarthritis of the left knee most pronounced within the patellofemoral compartment. 2. Partial-thickness fissure with small flap component at the anterior aspect of the medial femoral condyle. 3. Mild tendinosis of the distal quadriceps and proximal patellar tendons. 4. Small knee joint effusion. 5. Intact menisci and ligamentous structures.   Electronically Signed   By: Davina Poke D.O.   On: 11/26/2019 14:41   PMFS History: Patient Active Problem List   Diagnosis Date Noted  . Arthropathy of left knee 12/01/2019  . Osteoarthritis of knees (Bilateral) 11/02/2019  . Anxiety due to invasive  procedure 11/02/2019  . Unstable knee, left 10/25/2019  . S/P small bowel resection 06/14/2019  . Infected hernioplasty mesh ([place]) 03/19/2019  . Pseudoseizures ([place]) 02/22/2019  . ADHD 02/22/2019  . Anxiety 02/22/2019  . S/P emergency C-section   . MS (multiple sclerosis) ([place])   . Chronic pain   . Panic attack   . Open wound of abdominal wall, anterior, complicated, sequela 23/76/2831  . Postoperative wound infection 09/20/2018  . History of substance use disorder 07/19/2018  . Chronic nausea 04/13/2018  . Nausea with vomiting 03/23/2018  . Pharmacologic therapy 03/16/2018  . Disorder of skeletal system 03/16/2018  . Problems influencing health status 03/16/2018  . Abnormal MRI, cervical spine (03/06/17) 03/16/2018  . Morbid obesity with BMI of 45.0-49.9, adult ([place]) 03/16/2018  . Chronic knee pain (Bilateral) 02/18/2018  . Cervical facet hypertrophy 04/29/2017  . Cervical facet syndrome (Bilateral) (R>L) 04/29/2017  . Spondylosis without myelopathy or radiculopathy, cervical  region 04/29/2017  . Spondylosis without myelopathy or radiculopathy, lumbosacral region 03/04/2017  . Cervicogenic headache 02/10/2017  . Occipital headache 02/10/2017  . Trigger point with back pain (Left) 02/10/2017  . History of fainting (vasovagal) 02/10/2017  . History of vasovagal syncope 02/10/2017  . Neurogenic pain 01/23/2017  . Chronic musculoskeletal pain 12/02/2016  . Cervical radiculitis (Bilateral) 11/12/2016  . Chronic upper back pain (1ry area of Pain) (Bilateral) (R>L) 11/01/2016  . Chronic hand pain (3ry area of Pain) (Bilateral) (L>R) 11/01/2016  . Chronic wrist pain (3ry area of Pain) (Bilateral) (L>R) 11/01/2016  . Chronic foot/toes pain (4th area of Pain) (Bilateral) (R>L) 11/01/2016  . Chronic upper extremity pain (2ry area of Pain) (Bilateral) (L>R) 11/01/2016  . Long term prescription benzodiazepine use 11/01/2016  . 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 Cervical foraminal stenosis (Bilateral) (C6-7) 11/01/2016  . 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 DDD (degenerative disc disease), thoracic 11/01/2016  . DDD (degenerative disc disease), lumbar 11/01/2016  . Lumbar facet arthropathy 11/01/2016  . Lumbar facet syndrome (Bilateral) (R>L) 11/01/2016  . Vitamin D deficiency 10/28/2016  . Opiate use 07/23/2016  . Chronic neck pain (1ry area of Pain) (Bilateral) (R>L) 07/23/2016  . Chronic low back pain (2ry area of Pain) (Bilateral) (L>R) 07/23/2016  . Chronic knee pain (5th area of Pain) (Left) 07/23/2016  . Midline thoracic back pain 07/23/2016  . Chronic lower extremity pain (Bilateral) 07/23/2016  . Incarcerated incisional hernia 02/16/2016  . Incisional hernia 02/15/2016  . Carpal tunnel syndrome (3ry area of Pain) (Bilateral) (L>R) 11/13/2015  . Substance abuse ([place]) 09/07/2015  . Paresthesia 08/02/2015  . Chronic pain syndrome 08/02/2015  . Postpartum endometritis 07/21/2015  . PTSD (post-traumatic stress disorder) 05/16/2015  . Severe episode of recurrent major depressive disorder, without psychotic features ([place]) 05/16/2015  . GAD (generalized anxiety disorder) 05/16/2015  . Difficult intravenous access 05/08/2015  . Right optic neuritis 04/27/2015  . Conversion disorder with attacks or seizures, persistent, with psychological stressor 03/28/2015  . Migraine with aura and without status migrainosus, not intractable 03/28/2015  . Generalized anxiety disorder 03/28/2015  . Conversion disorder 03/28/2015  . HTN (hypertension) 02/27/2015  . Perforated bowel ([place]) 02/27/2015  . Depression 12/27/2014  . Multiple sclerosis ([place]) 12/27/2014  . Migraine 12/27/2014  . Seizure disorder ([place]) 12/27/2014  . Smoker 12/27/2014  . Severe obesity (BMI >= 40) ([place]) 12/27/2014  . DDD (degenerative disc disease), cervical 07/28/2014  . Major depressive disorder, single episode 11/02/2013  . Gastroesophageal reflux disease  10/25/2010  . Tonsillitis 10/25/2010   Past Medical History:  Diagnosis Date  . ADHD   . Anxiety   . Asthma as a child  . Chronic back pain   . Chronic pain   . Conversion disorder   . DDD (degenerative disc disease), cervical   . Depression   . External hemorrhoid   . Fatty liver   . Fibromyalgia   . GERD (gastroesophageal reflux disease)   . History of kidney stones   . HTN (hypertension) 02/27/2015  . JC virus antibody positive   . Migraines   . MS (multiple sclerosis) ([place])    06-2006  . Neuromuscular disorder ([place])    MS  . Neuropathy   . OA (osteoarthritis)   . Ovarian cyst   . Panic attack   . Perforated bowel ([place]) 2009   colostomy bag for 3 motnhs  . Pneumonia   . Pseudoseizures ([place])    last seizure  was 3 mo ago while taking modafinil  . PTSD (post-traumatic stress disorder)   . PTSD (post-traumatic stress disorder)   . S/P emergency C-section   . Seasonal allergies   . Urinary urgency   . Vertigo   . Wears contact lenses   . Wears glasses     Family History  Problem Relation Age of Onset  . Diabetes Mother   . Hypertension Mother   . Diabetes Father   . Hypertension Father   . Arthritis Father   . Cancer Maternal Grandmother   . Cancer Maternal Grandfather   . Alcohol abuse Neg Hx   . Anxiety disorder Neg Hx   . Bipolar disorder Neg Hx   . Drug abuse Neg Hx   . Depression Neg Hx   . Colon cancer Neg Hx     Past Surgical History:  Procedure Laterality Date  . ABDOMINAL SURGERY    . ABDOMINAL WOUND EXPLORATION   03/19/2019   Abdominal Wound Exploration with Explantation of Mesh  . APPENDECTOMY    . BOWEL RESECTION  01/2007   with colostomy  . BOWEL RESECTION N/A 06/14/2019   Procedure: SMALL BOWEL RESECTION;  Surgeon: Ralene Ok, MD;  Location: [facility];  Service: General;  Laterality: N/A;  . CESAREAN SECTION N/A 07/12/2015   Procedure: CESAREAN SECTION;  Surgeon: Guss Bunde, MD;  Location: [facility];  Service: Obstetrics;   Laterality: N/A;  . COLON SURGERY     part of colon and intestines removed; colostomy 2008  . COLONOSCOPY WITH PROPOFOL N/A 01/29/2017   Procedure: COLONOSCOPY WITH PROPOFOL;  Surgeon: Ileana Roup, MD;  Location: [facility];  Service: General;  Laterality: N/A;  . COLOSTOMY CLOSURE  04/2007  . EVALUATION UNDER ANESTHESIA WITH HEMORRHOIDECTOMY N/A 06/11/2018   Procedure: ANORECTAL EXAM UNDER ANESTHESIA WITH HEMORRHOIDECTOMY;  Surgeon: Ileana Roup, MD;  Location: [facility];  Service: General;  Laterality: N/A;  . EXCISIONAL HEMORRHOIDECTOMY    . EXTREMITY CYST EXCISION  1994   right leg  . HERNIA REPAIR    . INCISIONAL HERNIA REPAIR N/A 02/16/2016   Procedure: HERNIA REPAIR INCISIONAL;  Surgeon: Fanny Skates, MD;  [facility]: [facility];  Service: General;  Laterality: N/A;  . INSERTION OF MESH  02/16/2016   Procedure: INSERTION OF MESH;  Surgeon: Fanny Skates, MD;  Location: [facility];  Service: General;;  . LAPAROSCOPY N/A 06/14/2019   Procedure: ATTEMPTED DIAGNOSTIC LAPAROSCOPY;  Surgeon: Ralene Ok, MD;  Location: [facility];  Service: General;  Laterality: N/A;  . LAPAROTOMY  03/19/2019   Procedure: Abdominal Wound Exploration with Explantation of Mesh;  Surgeon: Ralene Ok, MD;  Location: [facility];  Service: General;;  . LAPAROTOMY N/A 06/14/2019   Procedure: EXPLORATORY LAPAROTOMY;  Surgeon: Ralene Ok, MD;  Location: [facility];  Service: General;  Laterality: N/A;  . LYSIS OF ADHESION N/A 06/14/2019   Procedure: LYSIS OF ADHESIONS;  Surgeon: Ralene Ok, MD;  Location: [facility];  Service: General;  Laterality: N/A;  . RADIOLOGY WITH ANESTHESIA N/A 03/06/2017   Procedure: MRI WITH ANESTHESIA OF CERVICAL SPINE WITHOUT CONTRAST, MRI OF LUMBAR SPINE WITHOUT CONTRAST;  Surgeon: Radiologist, Medication, MD;  Location: [facility];  Service: Radiology;  Laterality: N/A;  . RADIOLOGY WITH ANESTHESIA N/A 09/15/2018   Procedure: MRI OF BRAIN WITH AND WITHOUT CONTRAST;  Surgeon:  Radiologist, Medication, MD;  Location: [facility];  Service: Radiology;  Laterality: N/A;  . RADIOLOGY WITH ANESTHESIA N/A 10/13/2018   Procedure: MRI OF BRAIN WITH  AND WITHOUT CONTRAST;  Surgeon: Radiologist, Medication, MD;  Location: [facility];  Service: Radiology;  Laterality: N/A;  . RADIOLOGY WITH ANESTHESIA N/A 10/26/2018   Procedure: MRI UNDER ANESTHESIA; HEAD, CERVICAL AND THORASCIC SPINE;  Surgeon: Radiologist, Medication, MD;  Location: [facility];  Service: Radiology;  Laterality: N/A;  . SCAR REVISION  01/21/2011   Procedure: SCAR REVISION;  Surgeon: Hermelinda Dellen;  Location: [facility];  Service: Plastics;  Laterality: N/A;  exploration of scar of abdomen and repair of defect  . TOOTH EXTRACTION Left 10/2016  . TRANSRECTAL DRAINAGE OF PELVIC ABSCESS     Social History   Occupational History    Employer: OTHER    Comment: disability  Tobacco Use  . Smoking status: Former Smoker    Packs/day: 0.25    Years: 10.00    Pack years: 2.50    Types: Cigarettes    Quit date: 07/12/2015    Years since quitting: 4.4  . Smokeless tobacco: Never Used  Vaping Use  . Vaping Use: Never used  Substance and Sexual Activity  . Alcohol use: No    Alcohol/week: 0.0 standard drinks  . Drug use: No    Comment: marinol that shows up at thc  . Sexual activity: Not Currently    Partners: Male    Birth control/protection: None, Implant

## 2019-12-17 DIAGNOSIS — F445 Conversion disorder with seizures or convulsions: Secondary | ICD-10-CM | POA: Diagnosis not present

## 2019-12-17 DIAGNOSIS — G35 Multiple sclerosis: Secondary | ICD-10-CM | POA: Diagnosis not present

## 2019-12-17 DIAGNOSIS — G5603 Carpal tunnel syndrome, bilateral upper limbs: Secondary | ICD-10-CM | POA: Diagnosis not present

## 2019-12-17 DIAGNOSIS — M5030 Other cervical disc degeneration, unspecified cervical region: Secondary | ICD-10-CM | POA: Diagnosis not present

## 2019-12-17 DIAGNOSIS — G8929 Other chronic pain: Secondary | ICD-10-CM | POA: Diagnosis not present

## 2019-12-17 DIAGNOSIS — G43101 Migraine with aura, not intractable, with status migrainosus: Secondary | ICD-10-CM | POA: Diagnosis not present

## 2019-12-20 NOTE — Telephone Encounter (Signed)
Pt called and informed that we will work her in for an appointment closer to the time of her MRI appointment

## 2019-12-20 NOTE — Telephone Encounter (Signed)
Once we are closer to her MRI date, and she is SURE that she will make the appointment, then we will schedule f/u needed prior to MRI. Thanks

## 2019-12-24 ENCOUNTER — Telehealth: Payer: Self-pay | Admitting: Internal Medicine

## 2019-12-24 ENCOUNTER — Telehealth: Payer: Self-pay | Admitting: Neurology

## 2019-12-24 NOTE — Telephone Encounter (Signed)
Called pt, EGD rescheduled to 02/17/20 at 9:15am (she wanted morning procedure). Endo scheduler informed.

## 2019-12-24 NOTE — Telephone Encounter (Signed)
It's not ideal, but if that is what she can do for scheduling, then we have no choice. She can also ask MD doing endoscopy about their opinion. She knows my preference would be to do the MRI this month. Thanks

## 2019-12-24 NOTE — Telephone Encounter (Signed)
Pt had to reschedule her MRI to 2/16 she has an Endoscopy scheduled for 2/10 now both she has  sedation she is asking is that safe should she push one out,

## 2019-12-24 NOTE — Telephone Encounter (Signed)
Spoke to pt and informed her that It's not ideal, but if that is what she can do for scheduling, then we have no choice. She can also ask MD doing endoscopy about their opinion. She knows my preference would be to do the MRI this month, pt is going to talk to her sister about keeping daughter and talk to the MD doing the endo to see about spacing out her appointments

## 2019-12-24 NOTE — Telephone Encounter (Signed)
Pt needs to reschedule her EGD with Dr Gala Romney on 12/30/2019. [phone]

## 2019-12-27 ENCOUNTER — Telehealth: Payer: Self-pay | Admitting: Radiology

## 2019-12-27 ENCOUNTER — Telehealth: Payer: Self-pay | Admitting: Internal Medicine

## 2019-12-27 DIAGNOSIS — M25562 Pain in left knee: Secondary | ICD-10-CM

## 2019-12-27 DIAGNOSIS — R112 Nausea with vomiting, unspecified: Secondary | ICD-10-CM

## 2019-12-27 DIAGNOSIS — K219 Gastro-esophageal reflux disease without esophagitis: Secondary | ICD-10-CM

## 2019-12-27 DIAGNOSIS — M25561 Pain in right knee: Secondary | ICD-10-CM

## 2019-12-27 DIAGNOSIS — M1712 Unilateral primary osteoarthritis, left knee: Secondary | ICD-10-CM

## 2019-12-27 NOTE — Telephone Encounter (Signed)
Referral entered  

## 2019-12-27 NOTE — Telephone Encounter (Signed)
Ok thx.

## 2019-12-27 NOTE — Telephone Encounter (Signed)
Spoke to pt, her MRI appt was changed from 02/17/20 to 02/23/20. Advised her to call whomever is responsible for scheduling and have them remove appt 02/17/20 so EGD can be booked.

## 2019-12-27 NOTE — Addendum Note (Signed)
Addended by: Meyer Cory on: 12/27/2019 10:52 AM   Modules accepted: Orders

## 2019-12-27 NOTE — Telephone Encounter (Signed)
Routing to [facility] Refill box. Pt is requesting refill of Miranol 2.5 mg three times daily.

## 2019-12-27 NOTE — Telephone Encounter (Signed)
Pt said she needed to get her prescription ASAP from [facility]. She said it was dronapinol and wants to get it either today or tomorrow before the holiday.

## 2019-12-27 NOTE — Telephone Encounter (Signed)
Pt would like an order to have PT at [facility] for bilateral knee pain.  She can be reached at [phone]    OK for order?

## 2019-12-27 NOTE — Telephone Encounter (Signed)
Endo scheduler is unable to book EGD for 02/17/20 d/t Epic is showing pt has an appt under "admission" 02/17/20 for MRI. However, MRI appts are showing scheduled for 02/23/20 under future appt.  Tried to call pt, LMOVM for return call

## 2019-12-28 ENCOUNTER — Other Ambulatory Visit: Payer: Self-pay | Admitting: Neurology

## 2019-12-28 ENCOUNTER — Telehealth: Payer: Self-pay | Admitting: Neurology

## 2019-12-28 ENCOUNTER — Other Ambulatory Visit: Payer: Self-pay | Admitting: Gastroenterology

## 2019-12-28 DIAGNOSIS — G35 Multiple sclerosis: Secondary | ICD-10-CM

## 2019-12-28 DIAGNOSIS — R110 Nausea: Secondary | ICD-10-CM

## 2019-12-28 MED ORDER — DRONABINOL 2.5 MG PO CAPS: 2.5000 mg | ORAL_CAPSULE | Freq: Three times a day (TID) | ORAL | 2 refills | Status: DC

## 2019-12-28 NOTE — Telephone Encounter (Signed)
Routing message to Aliene Altes, [place] in the absence of the provider assigned to refills.

## 2019-12-28 NOTE — Telephone Encounter (Signed)
Noted. Spoke with pt. Pt is aware that her medication was sent in.

## 2019-12-28 NOTE — Addendum Note (Signed)
Addended by: Gordy Levan, Lennix Kneisel A on: 12/28/2019 01:25 PM   Modules accepted: Orders

## 2019-12-28 NOTE — Telephone Encounter (Signed)
Pt called this morning. She spoke to someone and they updated her appt. Endo scheduler informed.  Pre-op/covid test 02/15/20. Letter mailed with new procedure instructions.

## 2019-12-28 NOTE — Telephone Encounter (Signed)
Routine to [facility]iss to fill as I am unable to fill a the hospital.

## 2019-12-28 NOTE — Telephone Encounter (Signed)
Patient states that she is having her MRI done on 03-02-20 and that we were to see her back with in a certain time frame after her having the MRI and the nurse was going to call be her back to make appt. She Is waiting on the nurse to call her back about

## 2019-12-28 NOTE — Telephone Encounter (Signed)
She needs a visit as required by MRI under sedation 30 days BEFORE her MRI. I will schedule the visit closer to the date. Unless there are any changes on the MRI, we don't need to schedule a visit post-MRI. Thanks

## 2019-12-28 NOTE — Telephone Encounter (Signed)
Noted  

## 2019-12-28 NOTE — Telephone Encounter (Signed)
Pt called no answer left a voice mail informed that we are going to do a VV prior to her MRI so she will not have to worry about transpiration any questions call the office back

## 2019-12-28 NOTE — Telephone Encounter (Addendum)
PDMP Database searched and no concerns found.  Rx sent to [facility] per request.

## 2019-12-29 ENCOUNTER — Other Ambulatory Visit (HOSPITAL_COMMUNITY): Admission: RE | Admit: 2019-12-29 | Payer: Medicare Other | Source: Ambulatory Visit

## 2019-12-29 ENCOUNTER — Encounter (HOSPITAL_COMMUNITY): Admission: RE | Admit: 2019-12-29 | Payer: Medicare Other | Source: Ambulatory Visit

## 2020-01-18 DIAGNOSIS — M25561 Pain in right knee: Secondary | ICD-10-CM | POA: Diagnosis not present

## 2020-01-18 DIAGNOSIS — R2689 Other abnormalities of gait and mobility: Secondary | ICD-10-CM | POA: Diagnosis not present

## 2020-01-18 DIAGNOSIS — M25562 Pain in left knee: Secondary | ICD-10-CM | POA: Diagnosis not present

## 2020-01-18 DIAGNOSIS — M25661 Stiffness of right knee, not elsewhere classified: Secondary | ICD-10-CM | POA: Diagnosis not present

## 2020-01-18 DIAGNOSIS — M1712 Unilateral primary osteoarthritis, left knee: Secondary | ICD-10-CM | POA: Diagnosis not present

## 2020-01-18 DIAGNOSIS — R29898 Other symptoms and signs involving the musculoskeletal system: Secondary | ICD-10-CM | POA: Diagnosis not present

## 2020-01-18 DIAGNOSIS — M25662 Stiffness of left knee, not elsewhere classified: Secondary | ICD-10-CM | POA: Diagnosis not present

## 2020-01-20 DIAGNOSIS — Z3042 Encounter for surveillance of injectable contraceptive: Secondary | ICD-10-CM | POA: Diagnosis not present

## 2020-01-26 ENCOUNTER — Telehealth: Payer: Self-pay | Admitting: Neurology

## 2020-01-26 DIAGNOSIS — F902 Attention-deficit hyperactivity disorder, combined type: Secondary | ICD-10-CM | POA: Diagnosis not present

## 2020-01-26 DIAGNOSIS — F4312 Post-traumatic stress disorder, chronic: Secondary | ICD-10-CM | POA: Diagnosis not present

## 2020-01-26 DIAGNOSIS — F3132 Bipolar disorder, current episode depressed, moderate: Secondary | ICD-10-CM | POA: Diagnosis not present

## 2020-01-26 DIAGNOSIS — F064 Anxiety disorder due to known physiological condition: Secondary | ICD-10-CM | POA: Diagnosis not present

## 2020-01-26 NOTE — Telephone Encounter (Signed)
Emily [name] from [facility] called in. They faxed over an order on 01/24/20. She wants to confirm receipt of the order and wants to give clarification on an addendum order. She provided order number [id number].

## 2020-01-26 NOTE — Telephone Encounter (Signed)
Located patients paperwork. Given to Dr. Delice Lesch to sgn. Once signed will fax.

## 2020-01-31 DIAGNOSIS — F4312 Post-traumatic stress disorder, chronic: Secondary | ICD-10-CM | POA: Diagnosis not present

## 2020-01-31 DIAGNOSIS — F3132 Bipolar disorder, current episode depressed, moderate: Secondary | ICD-10-CM | POA: Diagnosis not present

## 2020-01-31 DIAGNOSIS — F064 Anxiety disorder due to known physiological condition: Secondary | ICD-10-CM | POA: Diagnosis not present

## 2020-01-31 DIAGNOSIS — F902 Attention-deficit hyperactivity disorder, combined type: Secondary | ICD-10-CM | POA: Diagnosis not present

## 2020-02-01 DIAGNOSIS — Z79891 Long term (current) use of opiate analgesic: Secondary | ICD-10-CM | POA: Diagnosis not present

## 2020-02-02 ENCOUNTER — Other Ambulatory Visit: Payer: Self-pay

## 2020-02-02 ENCOUNTER — Telehealth: Payer: Self-pay

## 2020-02-02 ENCOUNTER — Telehealth: Payer: Self-pay | Admitting: Neurology

## 2020-02-02 MED ORDER — PREDNISONE 20 MG PO TABS: ORAL_TABLET | ORAL | 0 refills | Status: DC

## 2020-02-02 NOTE — Telephone Encounter (Signed)
    Patient called to ask for Dr Aquino's advise. She doesn't want to go to the ED because she doesn't want to take her daughter with her. She states she has had a migraine since December and it is getting worse and causing her other conditions to get worse as well. She stated she was supposed to start a migraine medicine in November and has checked with her pharmacy multiple times and they are telling her they haven't received anything.

## 2020-02-02 NOTE — Telephone Encounter (Signed)
Noted. Rx for prednisone week course sent.

## 2020-02-02 NOTE — Telephone Encounter (Signed)
Pt called and informed that once we get in Samples of Emgality we will call her so that she can set up transportation so that she can start taking it every month, Pt verbalized understanding, Pt asked if there was anything that we could do to help her with her migraine until she could start the [facility]. Pt unable to come in for a headache cocktail or to the ER due to transportation. Message was sent to Dr. Delice Lesch. Pt was asked if she was agreeable to try prednisone for a week to help break her headache ? Pt stated yes she would like to try that and would like for it to be sent to [facility].

## 2020-02-02 NOTE — Telephone Encounter (Signed)
See other phone note

## 2020-02-02 NOTE — Addendum Note (Signed)
Addended by: Cameron Sprang on: 02/02/2020 03:52 PM   Modules accepted: Orders

## 2020-02-08 ENCOUNTER — Telehealth: Payer: Self-pay | Admitting: Internal Medicine

## 2020-02-08 DIAGNOSIS — F064 Anxiety disorder due to known physiological condition: Secondary | ICD-10-CM | POA: Diagnosis not present

## 2020-02-08 DIAGNOSIS — F4312 Post-traumatic stress disorder, chronic: Secondary | ICD-10-CM | POA: Diagnosis not present

## 2020-02-08 DIAGNOSIS — F3132 Bipolar disorder, current episode depressed, moderate: Secondary | ICD-10-CM | POA: Diagnosis not present

## 2020-02-08 DIAGNOSIS — F902 Attention-deficit hyperactivity disorder, combined type: Secondary | ICD-10-CM | POA: Diagnosis not present

## 2020-02-08 NOTE — Telephone Encounter (Signed)
PATIENT NEEDS TO CANCEL HER PROCEDURE

## 2020-02-08 NOTE — Telephone Encounter (Signed)
Called patient and is aware procedure cancelled. She will call to make an appt for OV to get rescheduled.

## 2020-02-14 ENCOUNTER — Ambulatory Visit (HOSPITAL_COMMUNITY): Payer: Medicare Other

## 2020-02-14 DIAGNOSIS — F4312 Post-traumatic stress disorder, chronic: Secondary | ICD-10-CM | POA: Diagnosis not present

## 2020-02-14 DIAGNOSIS — F064 Anxiety disorder due to known physiological condition: Secondary | ICD-10-CM | POA: Diagnosis not present

## 2020-02-14 DIAGNOSIS — F902 Attention-deficit hyperactivity disorder, combined type: Secondary | ICD-10-CM | POA: Diagnosis not present

## 2020-02-14 DIAGNOSIS — F3132 Bipolar disorder, current episode depressed, moderate: Secondary | ICD-10-CM | POA: Diagnosis not present

## 2020-02-15 ENCOUNTER — Other Ambulatory Visit (HOSPITAL_COMMUNITY): Payer: Medicare Other

## 2020-02-15 ENCOUNTER — Encounter (HOSPITAL_COMMUNITY): Payer: Medicare Other

## 2020-02-17 ENCOUNTER — Encounter (HOSPITAL_COMMUNITY): Admission: RE | Payer: Self-pay | Source: Home / Self Care

## 2020-02-17 ENCOUNTER — Other Ambulatory Visit (HOSPITAL_COMMUNITY): Payer: Medicare Other

## 2020-02-17 ENCOUNTER — Ambulatory Visit (HOSPITAL_COMMUNITY): Admission: RE | Admit: 2020-02-17 | Payer: Medicare Other | Source: Home / Self Care | Admitting: Internal Medicine

## 2020-02-17 ENCOUNTER — Other Ambulatory Visit: Payer: Self-pay | Admitting: Pain Medicine

## 2020-02-17 ENCOUNTER — Other Ambulatory Visit: Payer: Self-pay | Admitting: Neurology

## 2020-02-17 ENCOUNTER — Telehealth: Payer: Self-pay

## 2020-02-17 ENCOUNTER — Ambulatory Visit (HOSPITAL_COMMUNITY): Payer: Medicare Other

## 2020-02-17 DIAGNOSIS — M792 Neuralgia and neuritis, unspecified: Secondary | ICD-10-CM

## 2020-02-17 SURGERY — ESOPHAGOGASTRODUODENOSCOPY (EGD) WITH PROPOFOL
Anesthesia: Monitor Anesthesia Care

## 2020-02-17 NOTE — Telephone Encounter (Signed)
Pt called and informed that we have emgailty samples

## 2020-02-18 ENCOUNTER — Telehealth: Payer: Self-pay | Admitting: Pain Medicine

## 2020-02-18 NOTE — Telephone Encounter (Signed)
Patient called back stating Dr. Windell Hummingbird Rucker's office did not receive a letter asking them to take over medications. I will fax this to their office now. I did inform we did not have phys in office on Friday.

## 2020-02-18 NOTE — Telephone Encounter (Signed)
Patient called regarding refills on her gabapentin and Lyrica. I looked in chart and this was given over to PCP on 10-25-19 at patients appt. Patient states she did not know this. I will print and mail her letter again. She is going to call PCP for refills. Not sure if they will fill these meds. Pharmacy also sent in msg about these meds needing refill.

## 2020-02-18 NOTE — Progress Notes (Signed)
Pt came into the office for teaching on how to give her Emgality and to get a sample of the medication. While in the office pt took her 1st dose of Emgality 2 injections, pt tolerated well, pt was able to demonstrate giving her self the injections with no problem, pt was given 1 box of Samples of this drug Emgality were given to the patient, quantity 1box , Lot Number [id number] K exp 07/2021. Pt sat in room with this nurse for while after getting injections no signs of side effects, pt teaching of side effects of the medication was gone over pt verbalized understanding, sites of where to give the injection at home was gone over. Pt verbalized understanding, pt advised that prescription for next months injection would be called into the pharmacy for her,

## 2020-02-21 ENCOUNTER — Other Ambulatory Visit (HOSPITAL_COMMUNITY): Payer: Medicare Other

## 2020-02-23 ENCOUNTER — Other Ambulatory Visit (HOSPITAL_COMMUNITY): Payer: Medicare Other

## 2020-02-23 ENCOUNTER — Encounter (HOSPITAL_COMMUNITY): Payer: Self-pay

## 2020-02-28 ENCOUNTER — Ambulatory Visit: Payer: Medicare Other | Admitting: Neurology

## 2020-02-29 ENCOUNTER — Other Ambulatory Visit (HOSPITAL_COMMUNITY): Payer: Medicare Other

## 2020-02-29 DIAGNOSIS — F4312 Post-traumatic stress disorder, chronic: Secondary | ICD-10-CM | POA: Diagnosis not present

## 2020-02-29 DIAGNOSIS — F064 Anxiety disorder due to known physiological condition: Secondary | ICD-10-CM | POA: Diagnosis not present

## 2020-02-29 DIAGNOSIS — F3132 Bipolar disorder, current episode depressed, moderate: Secondary | ICD-10-CM | POA: Diagnosis not present

## 2020-02-29 DIAGNOSIS — F902 Attention-deficit hyperactivity disorder, combined type: Secondary | ICD-10-CM | POA: Diagnosis not present

## 2020-03-02 ENCOUNTER — Other Ambulatory Visit (HOSPITAL_COMMUNITY): Payer: Medicare Other

## 2020-03-02 ENCOUNTER — Ambulatory Visit: Admit: 2020-03-02 | Payer: Medicare Other

## 2020-03-02 ENCOUNTER — Ambulatory Visit (HOSPITAL_COMMUNITY): Admission: RE | Admit: 2020-03-02 | Payer: Medicare Other | Source: Ambulatory Visit

## 2020-03-02 SURGERY — MRI WITH ANESTHESIA
Anesthesia: General

## 2020-03-06 DIAGNOSIS — F4312 Post-traumatic stress disorder, chronic: Secondary | ICD-10-CM | POA: Diagnosis not present

## 2020-03-06 DIAGNOSIS — F3132 Bipolar disorder, current episode depressed, moderate: Secondary | ICD-10-CM | POA: Diagnosis not present

## 2020-03-06 DIAGNOSIS — F064 Anxiety disorder due to known physiological condition: Secondary | ICD-10-CM | POA: Diagnosis not present

## 2020-03-06 DIAGNOSIS — F902 Attention-deficit hyperactivity disorder, combined type: Secondary | ICD-10-CM | POA: Diagnosis not present

## 2020-03-23 DIAGNOSIS — J0100 Acute maxillary sinusitis, unspecified: Secondary | ICD-10-CM | POA: Diagnosis not present

## 2020-03-23 DIAGNOSIS — R070 Pain in throat: Secondary | ICD-10-CM | POA: Diagnosis not present

## 2020-03-23 DIAGNOSIS — J029 Acute pharyngitis, unspecified: Secondary | ICD-10-CM | POA: Diagnosis not present

## 2020-03-23 DIAGNOSIS — J040 Acute laryngitis: Secondary | ICD-10-CM | POA: Diagnosis not present

## 2020-03-29 ENCOUNTER — Telehealth: Payer: Self-pay | Admitting: Neurology

## 2020-03-29 DIAGNOSIS — F4312 Post-traumatic stress disorder, chronic: Secondary | ICD-10-CM | POA: Diagnosis not present

## 2020-03-29 DIAGNOSIS — F3132 Bipolar disorder, current episode depressed, moderate: Secondary | ICD-10-CM | POA: Diagnosis not present

## 2020-03-29 DIAGNOSIS — F064 Anxiety disorder due to known physiological condition: Secondary | ICD-10-CM | POA: Diagnosis not present

## 2020-03-29 DIAGNOSIS — F902 Attention-deficit hyperactivity disorder, combined type: Secondary | ICD-10-CM | POA: Diagnosis not present

## 2020-03-29 NOTE — Telephone Encounter (Signed)
PT advised she can start Botox again.

## 2020-03-29 NOTE — Telephone Encounter (Signed)
We can try again to get her back to Botox with Dr. Tomi Likens, pls start PA, thanks!

## 2020-03-29 NOTE — Telephone Encounter (Signed)
Per pt the migraines are still there with Emgality. The pain is all the time. Bending over makes it worse.    Pt wanted to know if she could go back on Botox.

## 2020-03-29 NOTE — Telephone Encounter (Signed)
Patient returned call to She[facility]. 

## 2020-03-29 NOTE — Telephone Encounter (Signed)
LMOVM for the pt to give the office a call back.

## 2020-03-29 NOTE — Telephone Encounter (Signed)
Patient states she is having very bad migraines. She was given a new medication but it isn't helping. She would like to try going back to botox injections because she feels like those helped her more. Please call

## 2020-03-30 ENCOUNTER — Ambulatory Visit (INDEPENDENT_AMBULATORY_CARE_PROVIDER_SITE_OTHER): Payer: Medicare Other | Admitting: Orthopaedic Surgery

## 2020-03-30 ENCOUNTER — Other Ambulatory Visit: Payer: Self-pay

## 2020-03-30 DIAGNOSIS — M4802 Spinal stenosis, cervical region: Secondary | ICD-10-CM | POA: Diagnosis not present

## 2020-03-30 DIAGNOSIS — M47816 Spondylosis without myelopathy or radiculopathy, lumbar region: Secondary | ICD-10-CM | POA: Diagnosis not present

## 2020-03-30 DIAGNOSIS — M5030 Other cervical disc degeneration, unspecified cervical region: Secondary | ICD-10-CM

## 2020-03-30 NOTE — Progress Notes (Signed)
Office Visit Note   Patient: Emily [name]           Date of Birth: 1980-01-24           MRN: [id number] Visit Date: 03/30/2020              Requested by: Leeanne Rio, MD [place],  [place] [id number] PCP: Leeanne Rio, MD   Assessment & Plan: Visit Diagnoses:  1. Lumbar facet arthropathy   2. Cervical foraminal stenosis (Bilateral) (C6-7)   3. DDD (degenerative disc disease), cervical     Plan: Patient resting epidural injections lumbar degenerative facets L5-S1 recess significant facet fluid worse on the right than left.  Previous MRI showed no central compression.  She does have MS by head and cervical spine MRIs.  She states her neurologist has ordered repeat cervical head and thoracic MRIs.  Follow-Up Instructions: Return if symptoms worsen or fail to improve.   Orders:  No orders of the defined types were placed in this encounter.  No orders of the defined types were placed in this encounter.     Procedures: No procedures performed   Clinical Data: No additional findings.   Subjective: Chief Complaint  Patient presents with  . 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  She has cervical spondylosis C5-6 C6-7 but states her neck is doing well.  She is requesting injection lumbar region.  Previous treatment in a pain clinic in [place].  She is off narcotic medication now and they have stated they cannot see her for many months.  She had past history of multiple facet injections where she has degenerative facets at L5-S1.  She cannot take anti-inflammatories since she has had multiple abdominal procedures including fistula that was a complication related to hernia repair.  Patient is amatory denies neurogenic claudication symptoms.  She has a 51-year-old daughter with developmental delays and patient has to lift her up frequently.  Review of Systems all the systems updated unchanged from   12/16/2019 office visit.  Objective: Vital Signs: There were no vitals taken for this visit.  Physical Exam Constitutional:      Appearance: She is well-developed.  HENT:     Head: Normocephalic.     Right Ear: External ear normal.     Left Ear: External ear normal.  Eyes:     Pupils: Pupils are equal, round, and reactive to light.  Neck:     Thyroid: No thyromegaly.     Trachea: No tracheal deviation.  Cardiovascular:     Rate and Rhythm: Normal rate.  Pulmonary:     Effort: Pulmonary effort is normal.  Abdominal:     Palpations: Abdomen is soft.  Skin:    General: Skin is warm and dry.  Neurological:     Mental Status: She is alert and oriented to person, place, and time.  Psychiatric:        Behavior: Behavior normal.     Ortho Exam  Specialty Comments:  No specialty comments available.  Imaging: No results found.   PMFS History: Patient Active Problem List   Diagnosis Date Noted  . Arthropathy of left knee 12/01/2019  . Osteoarthritis of knees (Bilateral) 11/02/2019  . Anxiety due to invasive procedure 11/02/2019  . Unstable knee, left 10/25/2019  . S/P small bowel resection 06/14/2019  . Infected hernioplasty mesh ([place]) 03/19/2019  . Pseudoseizures ([place]) 02/22/2019  .  ADHD 02/22/2019  . Anxiety 02/22/2019  . S/P emergency C-section   . MS (multiple sclerosis) ([place])   . Chronic pain   . Panic attack   . Open wound of abdominal wall, anterior, complicated, sequela 16/10/9602  . Postoperative wound infection 09/20/2018  . History of substance use disorder 07/19/2018  . Chronic nausea 04/13/2018  . Nausea with vomiting 03/23/2018  . Pharmacologic therapy 03/16/2018  . Disorder of skeletal system 03/16/2018  . Problems influencing health status 03/16/2018  . Abnormal MRI, cervical spine (03/06/17) 03/16/2018  . Morbid obesity with BMI of 45.0-49.9, adult ([place]) 03/16/2018  . Chronic knee pain (Bilateral) 02/18/2018  . Cervical facet hypertrophy  04/29/2017  . Cervical facet syndrome (Bilateral) (R>L) 04/29/2017  . Spondylosis without myelopathy or radiculopathy, cervical region 04/29/2017  . Spondylosis without myelopathy or radiculopathy, lumbosacral region 03/04/2017  . Cervicogenic headache 02/10/2017  . Occipital headache 02/10/2017  . Trigger point with back pain (Left) 02/10/2017  . History of fainting (vasovagal) 02/10/2017  . History of vasovagal syncope 02/10/2017  . Neurogenic pain 01/23/2017  . Chronic musculoskeletal pain 12/02/2016  . Cervical radiculitis (Bilateral) 11/12/2016  . Chronic upper back pain (1ry area of Pain) (Bilateral) (R>L) 11/01/2016  . Chronic hand pain (3ry area of Pain) (Bilateral) (L>R) 11/01/2016  . Chronic wrist pain (3ry area of Pain) (Bilateral) (L>R) 11/01/2016  . Chronic foot/toes pain (4th area of Pain) (Bilateral) (R>L) 11/01/2016  . Chronic upper extremity pain (2ry area of Pain) (Bilateral) (L>R) 11/01/2016  . Long term prescription benzodiazepine use 11/01/2016  . 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 Cervical foraminal stenosis (Bilateral) (C6-7) 11/01/2016  . 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 DDD (degenerative disc disease), thoracic 11/01/2016  . DDD (degenerative disc disease), lumbar 11/01/2016  . Lumbar facet arthropathy 11/01/2016  . Lumbar facet syndrome (Bilateral) (R>L) 11/01/2016  . Vitamin D deficiency 10/28/2016  . Opiate use 07/23/2016  . Chronic neck pain (1ry area of Pain) (Bilateral) (R>L) 07/23/2016  . Chronic low back pain (2ry area of Pain) (Bilateral) (L>R) 07/23/2016  . Chronic knee pain (5th area of Pain) (Left) 07/23/2016  . Midline thoracic back pain 07/23/2016  . Chronic lower extremity pain (Bilateral) 07/23/2016  . Incarcerated incisional hernia 02/16/2016  . Incisional hernia 02/15/2016  . Carpal tunnel syndrome (3ry area of Pain) (Bilateral) (L>R) 11/13/2015  . Substance abuse ([place]) 09/07/2015  .  Paresthesia 08/02/2015  . Chronic pain syndrome 08/02/2015  . Postpartum endometritis 07/21/2015  . PTSD (post-traumatic stress disorder) 05/16/2015  . Severe episode of recurrent major depressive disorder, without psychotic features ([place]) 05/16/2015  . GAD (generalized anxiety disorder) 05/16/2015  . Difficult intravenous access 05/08/2015  . Right optic neuritis 04/27/2015  . Conversion disorder with attacks or seizures, persistent, with psychological stressor 03/28/2015  . Migraine with aura and without status migrainosus, not intractable 03/28/2015  . Generalized anxiety disorder 03/28/2015  . Conversion disorder 03/28/2015  . HTN (hypertension) 02/27/2015  . Perforated bowel ([place]) 02/27/2015  . Depression 12/27/2014  . Multiple sclerosis ([place]) 12/27/2014  . Migraine 12/27/2014  . Seizure disorder ([place]) 12/27/2014  . Smoker 12/27/2014  . Severe obesity (BMI >= 40) ([place]) 12/27/2014  . DDD (degenerative disc disease), cervical 07/28/2014  . Major depressive disorder, single episode 11/02/2013  . Gastroesophageal reflux disease 10/25/2010  . Tonsillitis 10/25/2010   Past Medical History:  Diagnosis Date  . ADHD   . Anxiety   . Asthma as a child  . Chronic back  pain   . Chronic pain   . Conversion disorder   . DDD (degenerative disc disease), cervical   . Depression   . External hemorrhoid   . Fatty liver   . Fibromyalgia   . GERD (gastroesophageal reflux disease)   . History of kidney stones   . HTN (hypertension) 02/27/2015  . JC virus antibody positive   . Migraines   . MS (multiple sclerosis) ([place])    06-2006  . Neuromuscular disorder ([place])    MS  . Neuropathy   . OA (osteoarthritis)   . Ovarian cyst   . Panic attack   . Perforated bowel ([place]) 2009   colostomy bag for 3 motnhs  . Pneumonia   . Pseudoseizures ([place])    last seizure was 3 mo ago while taking modafinil  . PTSD (post-traumatic stress disorder)   . PTSD (post-traumatic stress disorder)   . S/P  emergency C-section   . Seasonal allergies   . Urinary urgency   . Vertigo   . Wears contact lenses   . Wears glasses     Family History  Problem Relation Age of Onset  . Diabetes Mother   . Hypertension Mother   . Diabetes Father   . Hypertension Father   . Arthritis Father   . Cancer Maternal Grandmother   . Cancer Maternal Grandfather   . Alcohol abuse Neg Hx   . Anxiety disorder Neg Hx   . Bipolar disorder Neg Hx   . Drug abuse Neg Hx   . Depression Neg Hx   . Colon cancer Neg Hx     Past Surgical History:  Procedure Laterality Date  . ABDOMINAL SURGERY    . ABDOMINAL WOUND EXPLORATION   03/19/2019   Abdominal Wound Exploration with Explantation of Mesh  . APPENDECTOMY    . BOWEL RESECTION  01/2007   with colostomy  . BOWEL RESECTION N/A 06/14/2019   Procedure: SMALL BOWEL RESECTION;  Surgeon: Ralene Ok, MD;  Location: [facility];  Service: General;  Laterality: N/A;  . CESAREAN SECTION N/A 07/12/2015   Procedure: CESAREAN SECTION;  Surgeon: Guss Bunde, MD;  Location: [facility];  Service: Obstetrics;  Laterality: N/A;  . COLON SURGERY     part of colon and intestines removed; colostomy 2008  . COLONOSCOPY WITH PROPOFOL N/A 01/29/2017   Procedure: COLONOSCOPY WITH PROPOFOL;  Surgeon: Ileana Roup, MD;  Location: [facility];  Service: General;  Laterality: N/A;  . COLOSTOMY CLOSURE  04/2007  . EVALUATION UNDER ANESTHESIA WITH HEMORRHOIDECTOMY N/A 06/11/2018   Procedure: ANORECTAL EXAM UNDER ANESTHESIA WITH HEMORRHOIDECTOMY;  Surgeon: Ileana Roup, MD;  Location: [facility];  Service: General;  Laterality: N/A;  . EXCISIONAL HEMORRHOIDECTOMY    . EXTREMITY CYST EXCISION  1994   right leg  . HERNIA REPAIR    . INCISIONAL HERNIA REPAIR N/A 02/16/2016   Procedure: HERNIA REPAIR INCISIONAL;  Surgeon: Fanny Skates, MD;  Location: [facility];  Service: General;  Laterality: N/A;  . INSERTION OF MESH  02/16/2016   Procedure: INSERTION OF  MESH;  Surgeon: Fanny Skates, MD;  Location: [facility];  Service: General;;  . LAPAROSCOPY N/A 06/14/2019   Procedure: ATTEMPTED DIAGNOSTIC LAPAROSCOPY;  Surgeon: Ralene Ok, MD;  Location: [facility];  Service: General;  Laterality: N/A;  . LAPAROTOMY  03/19/2019   Procedure: Abdominal Wound Exploration with Explantation of Mesh;  Surgeon: Ralene Ok, MD;  Location: [facility];  Service: General;;  . LAPAROTOMY N/A 06/14/2019  Procedure: EXPLORATORY LAPAROTOMY;  Surgeon: Ralene Ok, MD;  Location: [facility];  Service: General;  Laterality: N/A;  . LYSIS OF ADHESION N/A 06/14/2019   Procedure: LYSIS OF ADHESIONS;  Surgeon: Ralene Ok, MD;  Location: [facility];  Service: General;  Laterality: N/A;  . RADIOLOGY WITH ANESTHESIA N/A 03/06/2017   Procedure: MRI WITH ANESTHESIA OF CERVICAL SPINE WITHOUT CONTRAST, MRI OF LUMBAR SPINE WITHOUT CONTRAST;  Surgeon: Radiologist, Medication, MD;  Location: [facility];  Service: Radiology;  Laterality: N/A;  . RADIOLOGY WITH ANESTHESIA N/A 09/15/2018   Procedure: MRI OF BRAIN WITH AND WITHOUT CONTRAST;  Surgeon: Radiologist, Medication, MD;  Location: [facility];  Service: Radiology;  Laterality: N/A;  . RADIOLOGY WITH ANESTHESIA N/A 10/13/2018   Procedure: MRI OF BRAIN WITH AND WITHOUT CONTRAST;  Surgeon: Radiologist, Medication, MD;  Location: [facility];  Service: Radiology;  Laterality: N/A;  . RADIOLOGY WITH ANESTHESIA N/A 10/26/2018   Procedure: MRI UNDER ANESTHESIA; HEAD, CERVICAL AND THORASCIC SPINE;  Surgeon: Radiologist, Medication, MD;  Location: [facility];  Service: Radiology;  Laterality: N/A;  . SCAR REVISION  01/21/2011   Procedure: SCAR REVISION;  Surgeon: Hermelinda Dellen;  Location: [facility];  Service: Plastics;  Laterality: N/A;  exploration of scar of abdomen and repair of defect  . TOOTH EXTRACTION Left 10/2016  . TRANSRECTAL DRAINAGE OF PELVIC ABSCESS     Social History   Occupational History    Employer: OTHER    Comment: disability   Tobacco Use  . Smoking status: Former Smoker    Packs/day: 0.25    Years: 10.00    Pack years: 2.50    Types: Cigarettes    Quit date: 07/12/2015    Years since quitting: 4.7  . Smokeless tobacco: Never Used  Vaping Use  . Vaping Use: Never used  Substance and Sexual Activity  . Alcohol use: No    Alcohol/week: 0.0 standard drinks  . Drug use: No    Comment: marinol that shows up at thc  . Sexual activity: Not Currently    Partners: Male    Birth control/protection: None, Implant

## 2020-03-30 NOTE — Addendum Note (Signed)
Addended byLaurann Montana on: 03/30/2020 09:51 AM   Modules accepted: Orders

## 2020-04-03 ENCOUNTER — Other Ambulatory Visit: Payer: Self-pay | Admitting: Orthopaedic Surgery

## 2020-04-04 ENCOUNTER — Encounter: Payer: Self-pay | Admitting: Pain Medicine

## 2020-04-04 ENCOUNTER — Encounter: Payer: Self-pay | Admitting: Neurology

## 2020-04-04 ENCOUNTER — Telehealth: Payer: Self-pay

## 2020-04-04 DIAGNOSIS — G8929 Other chronic pain: Secondary | ICD-10-CM | POA: Insufficient documentation

## 2020-04-04 DIAGNOSIS — M545 Low back pain, unspecified: Secondary | ICD-10-CM | POA: Insufficient documentation

## 2020-04-04 NOTE — Progress Notes (Signed)
Per BV through [place] patient is buy and bill ONLY for 2022 and it is covered through medicare/medicaid. Sent BV to scanning for chart.

## 2020-04-04 NOTE — Telephone Encounter (Signed)
Spoke with patient and starting goin gover her virtual appointment questions and she asked if she could call me back because she was out.  States she will call back.

## 2020-04-04 NOTE — Progress Notes (Signed)
Patient: Emily [name]  Service Category: E/M  Provider: Gaspar Cola, MD  DOB: 22-Sep-1980  DOS: 04/05/2020  Location: Office  MRN: [id number]  Setting: Ambulatory outpatient  Referring Provider: Leeanne Rio, MD  Type: Established Patient  Specialty: Interventional Pain Management  PCP: Leeanne Rio, MD  Location: Remote location  Delivery: TeleHealth     Virtual Encounter - Pain Management PROVIDER NOTE: Information contained herein reflects review and annotations entered in association with encounter. Interpretation of such information and data should be left to medically-trained personnel. Information provided to patient can be located elsewhere in the medical record under "Patient Instructions". Document created using STT-dictation technology, any transcriptional errors that may result from process are unintentional.    Contact & Pharmacy Preferred: [place]: [phone] (home) Mobile: [phone] (mobile) E-mail: [email or web address] .[name], [place] [address] Phone: [phone] Fax: [phone]  [place], [place] [address] [place] [address] Phone: [phone] Fax: [phone]   Pre-screening  Ms. Cashin offered "in-person" vs "virtual" encounter. She indicated preferring virtual for this encounter.   Reason COVID-19*  Social distancing based on CDC and AMA recommendations.   I contacted Azaleah L Alphin on 04/05/2020 via telephone.      I clearly identified myself as Gaspar Cola, MD. I verified that I was speaking with the correct person using two identifiers (Name: Gabryel L Bachmeier, and date of birth: Sep 01, 1980).  Consent I sought verbal advanced consent from [facility] for virtual visit interactions. I informed Ms. Rolison of possible security and privacy concerns, risks, and limitations associated with  providing "not-in-person" medical evaluation and management services. I also informed Ms. Davison of the availability of "in-person" appointments. Finally, I informed her that there would be a charge for the virtual visit and that she could be  personally, fully or partially, financially responsible for it. Ms. Dutko expressed understanding and agreed to proceed.   Historic Elements   Ms. Anvitha L Federici is a 40 y.o. year old, female patient evaluated today after our last contact on 02/18/2020. Ms. Rayborn  has a past medical history of ADHD, Anxiety, Asthma (as a child), Chronic back pain, Chronic pain, Conversion disorder, DDD (degenerative disc disease), cervical, Depression, External hemorrhoid, Fatty liver, Fibromyalgia, GERD (gastroesophageal reflux disease), History of kidney stones, HTN (hypertension) (02/27/2015), JC virus antibody positive, Migraines, MS (multiple sclerosis) ([place]), Neuromuscular disorder ([place]), Neuropathy, OA (osteoarthritis), Ovarian cyst, Panic attack, Perforated bowel ([facility]) (2009), Pneumonia, Pseudoseizures ([place]), PTSD (post-traumatic stress disorder), PTSD (post-traumatic stress disorder), S/P emergency C-section, Seasonal allergies, Urinary urgency, Vertigo, Wears contact lenses, and Wears glasses. She also  has a past surgical history that includes Extremity cyst excision (1994); Bowel resection (01/2007); Colostomy closure (04/2007); Scar revision (01/21/2011); Hernia repair; Abdominal surgery; Appendectomy; Cesarean section (N/A, 07/12/2015); Incisional hernia repair (N/A, 02/16/2016); Insertion of mesh (02/16/2016); Tooth Extraction (Left, 10/2016); Colonoscopy with propofol (N/A, 01/29/2017); Radiology with anesthesia (N/A, 03/06/2017); Exam under anesthesia with hemorrhoidectomy (N/A, 06/11/2018); Excisional hemorrhoidectomy; Transrectal drainage of pelvic abscess; Radiology with anesthesia (N/A, 09/15/2018); Radiology with anesthesia (N/A, 10/13/2018); Radiology with anesthesia (N/A,  10/26/2018); Colon surgery; ABDOMINAL WOUND EXPLORATION  (03/19/2019); laparotomy (03/19/2019); laparoscopy (N/A, 06/14/2019); Lysis of adhesion (N/A, 06/14/2019); laparotomy (N/A, 06/14/2019); and Bowel resection (N/A, 06/14/2019). Ms. Fodge has a current medication list which includes the following prescription(s): albuterol, amlodipine, amphetamine-dextroamphetamine, cariprazine, diclofenac sodium, divalproex, dronabinol, epinephrine, famotidine, gilenya,  loratadine, medroxyprogesterone, methocarbamol, multivitamin with minerals, omeprazole, pregabalin, topiramate, and trazodone, and the following Facility-Administered Medications: metoclopramide. She  reports that she quit smoking about 4 years ago. Her smoking use included cigarettes. She has a 2.50 pack-year smoking history. She has never used smokeless tobacco. She reports that she does not drink alcohol and does not use drugs. Ms. Ridgely is allergic to baclofen, duloxetine hcl, gabapentin, monosodium glutamate, other, acetaminophen, alprazolam, amitriptyline, magnesium salicylate, modafinil, rizatriptan, tizanidine, vicodin [hydrocodone-acetaminophen], duloxetine, adhesive [tape], hydrocodone-acetaminophen, ketorolac tromethamine, lamotrigine, nsaids, and tramadol.   HPI  Today, she is being contacted for worsening of previously known (established) problem.  The patient indicates having a flareup of her low back pain, bilaterally, with the left side being worse than the right.  She refers that it is the same type of pain that she was having around August of last year when she had her last lumbar facet treatment.  On 08/31/2019 we did a therapeutic bilateral lumbar facet medial branch block, which on follow-up on 10/25/2019 she indicated had provided her with 100% relief of the pain for the duration of the local anesthetic which then went down to an 85% improvement and then went up again to a 90 to 100% improvement of her low back pain that was ongoing at that time.   This appears to have continued to be under control until recently when the pain started coming back.  Today she indicates that the pain is limited to the lower back and she has no referral of pain down the legs.  She denies any weakness or numbness of the lower extremities.  She indicates that she would like to go ahead and have the bilateral lumbar facet block repeated.  She also indicated that her preference is to have it done with some sedation.  Today I have asked the patient to hyperextend and rotate, which she did and she indicated that it reproduce her lower back pain.  Pharmacotherapy Assessment  Analgesic: None from our practice due to benzodiazepine use and high risk. The patient is also taking Marinol.   Monitoring: [facility] PMP: PDMP reviewed during this encounter.       Pharmacotherapy: No side-effects or adverse reactions reported. Compliance: No problems identified. Effectiveness: Clinically acceptable. Plan: Refer to "POC".  UDS:  Summary  Date Value Ref Range Status  03/16/2018 FINAL  Final    Comment:    ==================================================================== TOXASSURE COMP DRUG ANALYSIS,UR ==================================================================== Test                             Result       Flag       Units Drug Present and Declared for Prescription Verification   Desmethyldiazepam              214          EXPECTED   ng/mg creat   Oxazepam                       1241         EXPECTED   ng/mg creat   Temazepam                      1052         EXPECTED   ng/mg creat    Desmethyldiazepam, oxazepam, and temazepam are expected    metabolites of diazepam. Desmethyldiazepam and oxazepam are also    expected  metabolites of other drugs, including chlordiazepoxide,    prazepam, clorazepate, and halazepam. Oxazepam is an expected    metabolite of temazepam. Oxazepam and temazepam are also    available as scheduled prescription medications.   Pregabalin                      PRESENT      EXPECTED   Topiramate                     PRESENT      EXPECTED   Methocarbamol                  PRESENT      EXPECTED   Fluoxetine                     PRESENT      EXPECTED   Norfluoxetine                  PRESENT      EXPECTED    Norfluoxetine is an expected metabolite of fluoxetine. Drug Absent but Declared for Prescription Verification   Prochlorperazine               Not Detected UNEXPECTED   Ibuprofen                      Not Detected UNEXPECTED    Ibuprofen, as indicated in the declared medication list, is not    always detected even when used as directed. ==================================================================== Test                      Result    Flag   Units      Ref Range   Creatinine              29               mg/dL      >=20 ==================================================================== Declared Medications:  The flagging and interpretation on this report are based on the  following declared medications.  Unexpected results may arise from  inaccuracies in the declared medications.  **Note: The testing scope of this panel includes these medications:  Diazepam (Valium)  Fluoxetine (Prozac)  Methocarbamol (Robaxin)  Pregabalin (Lyrica)  Prochlorperazine (Compazine)  Topiramate (Topamax)  **Note: The testing scope of this panel does not include small to  moderate amounts of these reported medications:  Ibuprofen  **Note: The testing scope of this panel does not include following  reported medications:  Albuterol  Amlodipine (Norvasc)  Buspirone (BuSpar)  Divalproex (Depakote)  Epinephrine (EpiPen)  Etonogestrel (Nexplanon)  Fingolimod (Gilenya)  Lithium (Lithobid)  Loratadine (Claritin)  Multivitamin  Ranitidine (Zantac) ==================================================================== For clinical consultation, please call [phone]. ====================================================================      Laboratory Chemistry Profile   Renal Lab Results  Component Value Date   BUN 8 06/15/2019   CREATININE 0.70 06/15/2019   LABCREA 292.00 07/12/2015   BCR 12 08/13/2018   GFRAA >60 06/15/2019   GFRNONAA >60 06/15/2019     Hepatic Lab Results  Component Value Date   AST 18 06/07/2019   ALT 20 06/07/2019   ALBUMIN 3.8 06/07/2019   ALKPHOS 82 06/07/2019   LIPASE 41 07/12/2015     Electrolytes Lab Results  Component Value Date   NA 140 06/15/2019   K 3.7 06/15/2019   CL 105 06/15/2019   CALCIUM 8.8 (L) 06/15/2019   MG 2.2  03/16/2018     Bone Lab Results  Component Value Date   25OHVITD1 20 (L) 03/16/2018   25OHVITD2 4.7 03/16/2018   25OHVITD3 15 03/16/2018     Inflammation (CRP: Acute Phase) (ESR: Chronic Phase) Lab Results  Component Value Date   CRP 6 03/16/2018   ESRSEDRATE 20 03/16/2018   LATICACIDVEN 1.0 11/12/2018       Note: Above Lab results reviewed.  Imaging  MR KNEE LEFT WO CONTRAST CLINICAL DATA:  Chronic knee pain, worsening the previous 6 months  EXAM: MRI OF THE LEFT KNEE WITHOUT CONTRAST  TECHNIQUE: Multiplanar, multisequence MR imaging of the knee was performed. No intravenous contrast was administered.  COMPARISON:  08/02/2016  FINDINGS: MENISCI  Medial meniscus:  Intact.  Lateral meniscus:  Intact.  LIGAMENTS  Cruciates:  Intact ACL and PCL.  Collaterals: Medial collateral ligament is intact. Lateral collateral ligament complex is intact.  CARTILAGE  Patellofemoral: Thinning and surface irregularity of the lateral patellar facet with focal near full-thickness 4 mm cartilage defect (series 8, image 9). No trochlear cartilage defect.  Medial: Partial-thickness fissure with small flap component at the far anterior aspect of the medial femoral condyle (series 11, image 21).  Lateral:  No chondral defect.  Joint:  Small knee joint effusion.  Fat pads within normal limits.  Popliteal Fossa: No Baker cyst. Intact  popliteus tendon. Small multilobulated ganglia track along the popliteus myotendinous junction.  Extensor Mechanism: Intact quadriceps tendon and patellar tendon. Mild tendinosis of the distal quadriceps and proximal patellar tendons.  Bones: Lateralization of the patella without dislocation. TT-TG distance of 13 mm. No acute fracture. No dislocation. No suspicious bone lesion.  Other: 1.8 cm ganglion cyst at the posterior aspect of the lower leg associated with the lateral gastrocnemius origin.  IMPRESSION: 1. Mild osteoarthritis of the left knee most pronounced within the patellofemoral compartment. 2. Partial-thickness fissure with small flap component at the anterior aspect of the medial femoral condyle. 3. Mild tendinosis of the distal quadriceps and proximal patellar tendons. 4. Small knee joint effusion. 5. Intact menisci and ligamentous structures.  Electronically Signed   By: Davina Poke D.O.   On: 11/26/2019 14:41  Assessment  The primary encounter diagnosis was Chronic low back pain (Bilateral) w/o sciatica. Diagnoses of Lumbar facet syndrome (Bilateral) (R>L), Lumbar facet arthropathy, Spondylosis without myelopathy or radiculopathy, lumbosacral region, DDD (degenerative disc disease), lumbar, Chronic pain syndrome, and MS (multiple sclerosis) ([facility]) were also pertinent to this visit.  Plan of Care  Problem-specific:  No problem-specific Assessment & Plan notes found for this encounter.  Ms. Dashley Monts Marcell has a current medication list which includes the following long-term medication(s): albuterol, amphetamine-dextroamphetamine, divalproex, famotidine, gilenya, methocarbamol, omeprazole, pregabalin, topiramate, and trazodone.  Pharmacotherapy (Medications Ordered): No orders of the defined types were placed in this encounter.  Orders:  Orders Placed This Encounter  Procedures  . LUMBAR FACET(MEDIAL BRANCH NERVE BLOCK) MBNB    Standing Status:   Future     Standing Expiration Date:   05/05/2020    Scheduling Instructions:     Procedure: Lumbar facet block (AKA.: Lumbosacral medial branch nerve block)     Side: Bilateral     Level: L3-4, L4-5, & L5-S1 Facets (L2, L3, L4, L5, & S1 Medial Branch Nerves)     Sedation: Patient's choice.     Timeframe: ASAA    Order Specific Question:   Where will this procedure be performed?    Answer:   [facility]   Follow-up  plan:   Return for Procedure (w/ sedation): (B) L-FCT BLK.     Interventional Therapies  Risk  Complexity Considerations:   Estimated body mass index is 43.94 kg/m as calculated from the following:   Height as of 12/16/19: _0  (1.626 m).   Weight as of 12/16/19: 256 lb (116.1 kg). NOTE: NO Lumbar RFA until BMI<35. History of vasovagal tendencies  High risk for SUD.   Planned  Pending:   Pending further evaluation   Under consideration:   Diagnosticmidline thoracic ESI Diagnosticbilateral thoracic facet block  Diagnostic right sided LESI Diagnosticleft knee genicular NB Diagnosticright occipital NB   Completed:   Diagnostic right cervical ESI x4 (01/23/2017)  Diagnostic left lumbar facet block x4 (08/31/2019)  Diagnostic right lumbar facet block x3 (08/31/2019)  Diagnostic bilateral cervical facet block x1 (05/27/2017) (w/o Steroids)  Diagnostic bilateral IA knee joint injection x1 (11/02/2019) (w/o Steroids)    Therapeutic  Palliative (PRN) options:   Palliative bilateral lumbar facet MBB #[id number]     Recent Visits No visits were found meeting these conditions. Showing recent visits within past 90 days and meeting all other requirements Today's Visits Date Type Provider Dept  04/05/20 Telemedicine Milinda Pointer, MD [facility]  Showing today's visits and meeting all other requirements Future Appointments No visits were found meeting these conditions. Showing future appointments within next 90 days and meeting all other  requirements  I discussed the assessment and treatment plan with the patient. The patient was provided an opportunity to ask questions and all were answered. The patient agreed with the plan and demonstrated an understanding of the instructions.  Patient advised to call back or seek an in-person evaluation if the symptoms or condition worsens.  Duration of encounter: 16 minutes.  Note by: Gaspar Cola, MD Date: 04/05/2020; Time: 9:33 AM

## 2020-04-05 ENCOUNTER — Ambulatory Visit: Payer: Medicare Other | Attending: Pain Medicine | Admitting: Pain Medicine

## 2020-04-05 ENCOUNTER — Other Ambulatory Visit: Payer: Self-pay

## 2020-04-05 DIAGNOSIS — M47817 Spondylosis without myelopathy or radiculopathy, lumbosacral region: Secondary | ICD-10-CM

## 2020-04-05 DIAGNOSIS — G894 Chronic pain syndrome: Secondary | ICD-10-CM

## 2020-04-05 DIAGNOSIS — M545 Low back pain, unspecified: Secondary | ICD-10-CM | POA: Diagnosis not present

## 2020-04-05 DIAGNOSIS — M47816 Spondylosis without myelopathy or radiculopathy, lumbar region: Secondary | ICD-10-CM

## 2020-04-05 DIAGNOSIS — G35 Multiple sclerosis: Secondary | ICD-10-CM | POA: Diagnosis not present

## 2020-04-05 DIAGNOSIS — G8929 Other chronic pain: Secondary | ICD-10-CM

## 2020-04-05 DIAGNOSIS — M51369 Other intervertebral disc degeneration, lumbar region without mention of lumbar back pain or lower extremity pain: Secondary | ICD-10-CM

## 2020-04-05 DIAGNOSIS — G35D Multiple sclerosis, unspecified: Secondary | ICD-10-CM

## 2020-04-05 DIAGNOSIS — M5136 Other intervertebral disc degeneration, lumbar region: Secondary | ICD-10-CM

## 2020-04-05 NOTE — Patient Instructions (Signed)
____________________________________________________________________________________________  Preparing for Procedure with Sedation  Procedure appointments are limited to planned procedures: . No Prescription Refills. . No disability issues will be discussed. . No medication changes will be discussed.  Instructions: . Oral Intake: Do not eat or drink anything for at least 8 hours prior to your procedure. (Exception: Blood Pressure Medication. See below.) . Transportation: Unless otherwise stated by your physician, you may drive yourself after the procedure. . Blood Pressure Medicine: Do not forget to take your blood pressure medicine with a sip of water the morning of the procedure. If your Diastolic (lower reading)is above 100 mmHg, elective cases will be cancelled/rescheduled. . Blood thinners: These will need to be stopped for procedures. Notify our staff if you are taking any blood thinners. Depending on which one you take, there will be specific instructions on how and when to stop it. . Diabetics on insulin: Notify the staff so that you can be scheduled 1st case in the morning. If your diabetes requires high dose insulin, take only  of your normal insulin dose the morning of the procedure and notify the staff that you have done so. . Preventing infections: Shower with an antibacterial soap the morning of your procedure. . Build-up your immune system: Take 1000 mg of Vitamin C with every meal (3 times a day) the day prior to your procedure. . Antibiotics: Inform the staff if you have a condition or reason that requires you to take antibiotics before dental procedures. . Pregnancy: If you are pregnant, call and cancel the procedure. . Sickness: If you have a cold, fever, or any active infections, call and cancel the procedure. . Arrival: You must be in the facility at least 30 minutes prior to your scheduled procedure. . Children: Do not bring children with you. . Dress appropriately:  Bring dark clothing that you would not mind if they get stained. . Valuables: Do not bring any jewelry or valuables.  Reasons to call and reschedule or cancel your procedure: (Following these recommendations will minimize the risk of a serious complication.) . Surgeries: Avoid having procedures within 2 weeks of any surgery. (Avoid for 2 weeks before or after any surgery). . Flu Shots: Avoid having procedures within 2 weeks of a flu shots or . (Avoid for 2 weeks before or after immunizations). . Barium: Avoid having a procedure within 7-10 days after having had a radiological study involving the use of radiological contrast. (Myelograms, Barium swallow or enema study). . Heart attacks: Avoid any elective procedures or surgeries for the initial 6 months after a "Myocardial Infarction" (Heart Attack). . Blood thinners: It is imperative that you stop these medications before procedures. Let us know if you if you take any blood thinner.  . Infection: Avoid procedures during or within two weeks of an infection (including chest colds or gastrointestinal problems). Symptoms associated with infections include: Localized redness, fever, chills, night sweats or profuse sweating, burning sensation when voiding, cough, congestion, stuffiness, runny nose, sore throat, diarrhea, nausea, vomiting, cold or Flu symptoms, recent or current infections. It is specially important if the infection is over the area that we intend to treat. . Heart and lung problems: Symptoms that may suggest an active cardiopulmonary problem include: cough, chest pain, breathing difficulties or shortness of breath, dizziness, ankle swelling, uncontrolled high or unusually low blood pressure, and/or palpitations. If you are experiencing any of these symptoms, cancel your procedure and contact your primary care physician for an evaluation.  Remember:  Regular Business hours are:    Monday to Thursday 8:00 AM to 4:00 PM  Provider's  Schedule: Ebelyn Bohnet, MD:  Procedure days: Tuesday and Thursday 7:30 AM to 4:00 PM  Bilal Lateef, MD:  Procedure days: Monday and Wednesday 7:30 AM to 4:00 PM ____________________________________________________________________________________________   ____________________________________________________________________________________________  General Risks and Possible Complications  Patient Responsibilities: It is important that you read this as it is part of your informed consent. It is our duty to inform you of the risks and possible complications associated with treatments offered to you. It is your responsibility as a patient to read this and to ask questions about anything that is not clear or that you believe was not covered in this document.  Patient's Rights: You have the right to refuse treatment. You also have the right to change your mind, even after initially having agreed to have the treatment done. However, under this last option, if you wait until the last second to change your mind, you may be charged for the materials used up to that point.  Introduction: Medicine is not an exact science. Everything in Medicine, including the lack of treatment(s), carries the potential for danger, harm, or loss (which is by definition: Risk). In Medicine, a complication is a secondary problem, condition, or disease that can aggravate an already existing one. All treatments carry the risk of possible complications. The fact that a side effects or complications occurs, does not imply that the treatment was conducted incorrectly. It must be clearly understood that these can happen even when everything is done following the highest safety standards.  No treatment: You can choose not to proceed with the proposed treatment alternative. The "PRO(s)" would include: avoiding the risk of complications associated with the therapy. The "CON(s)" would include: not getting any of the treatment  benefits. These benefits fall under one of three categories: diagnostic; therapeutic; and/or palliative. Diagnostic benefits include: getting information which can ultimately lead to improvement of the disease or symptom(s). Therapeutic benefits are those associated with the successful treatment of the disease. Finally, palliative benefits are those related to the decrease of the primary symptoms, without necessarily curing the condition (example: decreasing the pain from a flare-up of a chronic condition, such as incurable terminal cancer).  General Risks and Complications: These are associated to most interventional treatments. They can occur alone, or in combination. They fall under one of the following six (6) categories: no benefit or worsening of symptoms; bleeding; infection; nerve damage; allergic reactions; and/or death. 1. No benefits or worsening of symptoms: In Medicine there are no guarantees, only probabilities. No healthcare provider can ever guarantee that a medical treatment will work, they can only state the probability that it may. Furthermore, there is always the possibility that the condition may worsen, either directly, or indirectly, as a consequence of the treatment. 2. Bleeding: This is more common if the patient is taking a blood thinner, either prescription or over the counter (example: Goody Powders, Fish oil, Aspirin, Garlic, etc.), or if suffering a condition associated with impaired coagulation (example: Hemophilia, cirrhosis of the liver, low platelet counts, etc.). However, even if you do not have one on these, it can still happen. If you have any of these conditions, or take one of these drugs, make sure to notify your treating physician. 3. Infection: This is more common in patients with a compromised immune system, either due to disease (example: diabetes, cancer, human immunodeficiency virus [HIV], etc.), or due to medications or treatments (example: therapies used to treat  cancer and   rheumatological diseases). However, even if you do not have one on these, it can still happen. If you have any of these conditions, or take one of these drugs, make sure to notify your treating physician. 4. Nerve Damage: This is more common when the treatment is an invasive one, but it can also happen with the use of medications, such as those used in the treatment of cancer. The damage can occur to small secondary nerves, or to large primary ones, such as those in the spinal cord and brain. This damage may be temporary or permanent and it may lead to impairments that can range from temporary numbness to permanent paralysis and/or brain death. 5. Allergic Reactions: Any time a substance or material comes in contact with our body, there is the possibility of an allergic reaction. These can range from a mild skin rash (contact dermatitis) to a severe systemic reaction (anaphylactic reaction), which can result in death. 6. Death: In general, any medical intervention can result in death, most of the time due to an unforeseen complication. ____________________________________________________________________________________________   

## 2020-04-12 DIAGNOSIS — Z3042 Encounter for surveillance of injectable contraceptive: Secondary | ICD-10-CM | POA: Diagnosis not present

## 2020-04-13 ENCOUNTER — Ambulatory Visit (HOSPITAL_BASED_OUTPATIENT_CLINIC_OR_DEPARTMENT_OTHER): Payer: Medicare Other | Admitting: Pain Medicine

## 2020-04-13 ENCOUNTER — Ambulatory Visit
Admission: RE | Admit: 2020-04-13 | Discharge: 2020-04-13 | Disposition: A | Discharge status: Home / Self Care | Payer: Medicare Other | Source: Ambulatory Visit | Attending: Pain Medicine | Admitting: Pain Medicine

## 2020-04-13 ENCOUNTER — Encounter: Payer: Self-pay | Admitting: Pain Medicine

## 2020-04-13 ENCOUNTER — Other Ambulatory Visit: Payer: Self-pay

## 2020-04-13 VITALS — BP 113/71 | HR 96 | Temp 97.3°F | Resp 16 | Ht 64.0 in | Wt 256.0 lb

## 2020-04-13 DIAGNOSIS — R55 Syncope and collapse: Secondary | ICD-10-CM | POA: Insufficient documentation

## 2020-04-13 DIAGNOSIS — R937 Abnormal findings on diagnostic imaging of other parts of musculoskeletal system: Secondary | ICD-10-CM | POA: Diagnosis not present

## 2020-04-13 DIAGNOSIS — M5136 Other intervertebral disc degeneration, lumbar region: Secondary | ICD-10-CM | POA: Insufficient documentation

## 2020-04-13 DIAGNOSIS — M47817 Spondylosis without myelopathy or radiculopathy, lumbosacral region: Secondary | ICD-10-CM

## 2020-04-13 DIAGNOSIS — M47816 Spondylosis without myelopathy or radiculopathy, lumbar region: Secondary | ICD-10-CM

## 2020-04-13 DIAGNOSIS — F419 Anxiety disorder, unspecified: Secondary | ICD-10-CM

## 2020-04-13 DIAGNOSIS — M545 Low back pain, unspecified: Secondary | ICD-10-CM

## 2020-04-13 DIAGNOSIS — Z9189 Other specified personal risk factors, not elsewhere classified: Secondary | ICD-10-CM | POA: Insufficient documentation

## 2020-04-13 DIAGNOSIS — G8929 Other chronic pain: Secondary | ICD-10-CM

## 2020-04-13 MED ORDER — DIPHENHYDRAMINE HCL 50 MG/ML IJ SOLN
12.5000 mg | Freq: Once | INTRAMUSCULAR | Status: AC
  Administered 2020-04-13: 12.5 mg via INTRAVENOUS
  Filled 2020-04-13: qty 1

## 2020-04-13 MED ORDER — LACTATED RINGERS IV SOLN
1000.0000 mL | Freq: Once | INTRAVENOUS | Status: AC
  Administered 2020-04-13: 1000 mL via INTRAVENOUS

## 2020-04-13 MED ORDER — FENTANYL CITRATE (PF) 100 MCG/2ML IJ SOLN
25.0000 ug | INTRAMUSCULAR | Status: DC | PRN
Start: 2020-04-13 — End: 2020-04-13
  Administered 2020-04-13: 50 ug via INTRAVENOUS
  Filled 2020-04-13: qty 2

## 2020-04-13 MED ORDER — ROPIVACAINE HCL 2 MG/ML IJ SOLN
18.0000 mL | Freq: Once | INTRAMUSCULAR | Status: AC
  Administered 2020-04-13: 18 mL via PERINEURAL
  Filled 2020-04-13: qty 20

## 2020-04-13 MED ORDER — LIDOCAINE HCL 2 % IJ SOLN
20.0000 mL | Freq: Once | INTRAMUSCULAR | Status: AC
  Administered 2020-04-13: 400 mg
  Filled 2020-04-13: qty 20

## 2020-04-13 MED ORDER — MIDAZOLAM HCL 5 MG/5ML IJ SOLN
1.0000 mg | INTRAMUSCULAR | Status: DC | PRN
  Administered 2020-04-13 (×3): 1 mg via INTRAVENOUS
  Filled 2020-04-13: qty 5

## 2020-04-13 MED ORDER — GLYCOPYRROLATE 0.2 MG/ML IJ SOLN
0.2000 mg | Freq: Once | INTRAMUSCULAR | Status: AC
Start: 2020-04-13 — End: 2020-04-13
  Administered 2020-04-13: 0.2 mg via INTRAVENOUS
  Filled 2020-04-13: qty 1

## 2020-04-13 MED ORDER — TRIAMCINOLONE ACETONIDE 40 MG/ML IJ SUSP
80.0000 mg | Freq: Once | INTRAMUSCULAR | Status: AC
  Administered 2020-04-13: 80 mg
  Filled 2020-04-13: qty 2

## 2020-04-13 NOTE — Progress Notes (Signed)
Safety precautions to be maintained throughout the outpatient stay will include: orient to surroundings, keep bed in low position, maintain call bell within reach at all times, provide assistance with transfer out of bed and ambulation.  

## 2020-04-13 NOTE — Progress Notes (Signed)
PROVIDER NOTE: Information contained herein reflects review and annotations entered in association with encounter. Interpretation of such information and data should be left to medically-trained personnel. Information provided to patient can be located elsewhere in the medical record under "Patient Instructions". Document created using STT-dictation technology, any transcriptional errors that may result from process are unintentional.    Patient: Emily [name]  Service Category: Procedure  Provider: Gaspar Cola, MD  DOB: June 19, 1980  DOS: 04/13/2020  Location: [facility]  MRN: [id number]  Setting: Ambulatory - outpatient  Referring Provider: Leeanne Rio, MD  Type: Established Patient  Specialty: Interventional Pain Management  PCP: Leeanne Rio, MD   Primary Reason for Visit: Interventional Pain Management Treatment. CC: Back Pain  Procedure:          Anesthesia, Analgesia, Anxiolysis:  Type: Lumbar Facet, Medial Branch Block(s) #4  Primary Purpose: Therapeutic Region: Posterolateral Lumbosacral Spine Level: L3, L4, L5, & S1 Medial Branch Level(s). Injecting these levels blocks the L4-5, and L5-S1 lumbar facet joints. Laterality: Bilateral  Type: Moderate (Conscious) Sedation combined with Local Anesthesia Indication(s): Analgesia and Anxiety Route: Intravenous (IV) IV Access: Secured Sedation: Meaningful verbal contact was maintained at all times during the procedure  Local Anesthetic: Lidocaine 1-2%  Position: Prone   Indications: 1. Lumbar facet syndrome (Bilateral) (R>L)   2. Spondylosis without myelopathy or radiculopathy, lumbosacral region   3. Lumbar facet arthropathy   4. DDD (degenerative disc disease), lumbar   5. Chronic low back pain (Bilateral) w/o sciatica   6. History of vasovagal syncope   7. History of fainting (vasovagal)   8. Anxiety    Pain Score: Pre-procedure: 8 /10 Post-procedure: 0-No pain/10   Pre-op H&P Assessment:   Emily [name] is a 40 y.o. (year old), female patient, seen today for interventional treatment. 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); Scar revision (01/21/2011); Hernia repair; Abdominal surgery; Appendectomy; Cesarean section (N/A, 07/12/2015); Incisional hernia repair (N/A, 02/16/2016); Insertion of mesh (02/16/2016); Tooth Extraction (Left, 10/2016); Colonoscopy with propofol (N/A, 01/29/2017); Radiology with anesthesia (N/A, 03/06/2017); Exam under anesthesia with hemorrhoidectomy (N/A, 06/11/2018); Excisional hemorrhoidectomy; Transrectal drainage of pelvic abscess; Radiology with anesthesia (N/A, 09/15/2018); Radiology with anesthesia (N/A, 10/13/2018); Radiology with anesthesia (N/A, 10/26/2018); Colon surgery; ABDOMINAL WOUND EXPLORATION  (03/19/2019); laparotomy (03/19/2019); laparoscopy (N/A, 06/14/2019); Lysis of adhesion (N/A, 06/14/2019); laparotomy (N/A, 06/14/2019); and Bowel resection (N/A, 06/14/2019). Emily [name] has a current medication list which includes the following prescription(s): albuterol, amlodipine, amphetamine-dextroamphetamine, cariprazine, diclofenac sodium, divalproex, dronabinol, epinephrine, famotidine, gilenya, loratadine, medroxyprogesterone, multivitamin with minerals, omeprazole, topiramate, trazodone, methocarbamol, and pregabalin, and the following Facility-Administered Medications: fentanyl, metoclopramide, and midazolam. Her primarily concern today is the Back Pain  Initial Vital Signs:  Pulse/HCG Rate: 87ECG Heart Rate: 90 Temp: (!) 97.3 F (36.3 C) Resp: 19 BP: 125/81 SpO2: 100 %  BMI: Estimated body mass index is 43.94 kg/m as calculated from the following:   Height as of this encounter: 5\' 4"  (1.626 m).   Weight as of this encounter: 256 lb (116.1 kg).  Risk Assessment: Allergies: Reviewed. She is allergic to baclofen, duloxetine hcl, gabapentin, monosodium glutamate, other,  acetaminophen, alprazolam, amitriptyline, magnesium salicylate, modafinil, rizatriptan, tizanidine, vicodin [hydrocodone-acetaminophen], duloxetine, adhesive [tape], hydrocodone-acetaminophen, ketorolac tromethamine, lamotrigine, nsaids, and tramadol.  Allergy Precautions: None required Coagulopathies: Reviewed. None identified.  Blood-thinner therapy: None at this time Active Infection(s): Reviewed. None identified. Emily [name] is afebrile  Site Confirmation: Emily [name] was  asked to confirm the procedure and laterality before marking the site Procedure checklist: Completed Consent: Before the procedure and under the influence of no sedative(s), amnesic(s), or anxiolytics, the patient was informed of the treatment options, risks and possible complications. To fulfill our ethical and legal obligations, as recommended by the American Medical Association's Code of Ethics, I have informed the patient of my clinical impression; the nature and purpose of the treatment or procedure; the risks, benefits, and possible complications of the intervention; the alternatives, including doing nothing; the risk(s) and benefit(s) of the alternative treatment(s) or procedure(s); and the risk(s) and benefit(s) of doing nothing. The patient was provided information about the general risks and possible complications associated with the procedure. These may include, but are not limited to: failure to achieve desired goals, infection, bleeding, organ or nerve damage, allergic reactions, paralysis, and death. In addition, the patient was informed of those risks and complications associated to Spine-related procedures, such as failure to decrease pain; infection (i.e.: Meningitis, epidural or intraspinal abscess); bleeding (i.e.: epidural hematoma, subarachnoid hemorrhage, or any other type of intraspinal or peri-dural bleeding); organ or nerve damage (i.e.: Any type of peripheral nerve, nerve root, or spinal cord injury) with  subsequent damage to sensory, motor, and/or autonomic systems, resulting in permanent pain, numbness, and/or weakness of one or several areas of the body; allergic reactions; (i.e.: anaphylactic reaction); and/or death. Furthermore, the patient was informed of those risks and complications associated with the medications. These include, but are not limited to: allergic reactions (i.e.: anaphylactic or anaphylactoid reaction(s)); adrenal axis suppression; blood sugar elevation that in diabetics may result in ketoacidosis or comma; water retention that in patients with history of congestive heart failure may result in shortness of breath, pulmonary edema, and decompensation with resultant heart failure; weight gain; swelling or edema; medication-induced neural toxicity; particulate matter embolism and blood vessel occlusion with resultant organ, and/or nervous system infarction; and/or aseptic necrosis of one or more joints. Finally, the patient was informed that Medicine is not an exact science; therefore, there is also the possibility of unforeseen or unpredictable risks and/or possible complications that may result in a catastrophic outcome. The patient indicated having understood very clearly. We have given the patient no guarantees and we have made no promises. Enough time was given to the patient to ask questions, all of which were answered to the patient's satisfaction. Ms. Corea has indicated that she wanted to continue with the procedure. Attestation: I, the ordering provider, attest that I have discussed with the patient the benefits, risks, side-effects, alternatives, likelihood of achieving goals, and potential problems during recovery for the procedure that I have provided informed consent. Date  Time: 04/13/2020  8:18 AM  Pre-Procedure Preparation:  Monitoring: As per clinic protocol. Respiration, ETCO2, SpO2, BP, heart rate and rhythm monitor placed and checked for adequate function Safety  Precautions: Patient was assessed for positional comfort and pressure points before starting the procedure. Time-out: I initiated and conducted the "Time-out" before starting the procedure, as per protocol. The patient was asked to participate by confirming the accuracy of the "Time Out" information. Verification of the correct person, site, and procedure were performed and confirmed by me, the nursing staff, and the patient. "Time-out" conducted as per Joint Commission's Universal Protocol (UP.01.01.01). Time: 0920  Description of Procedure:          Laterality: Bilateral. The procedure was performed in identical fashion on both sides. Levels:  L3, L4, L5, & S1 Medial Branch Level(s) Area Prepped:  Posterior Lumbosacral Region DuraPrep (Iodine Povacrylex [0.7% available iodine] and Isopropyl Alcohol, 74% w/w) Safety Precautions: Aspiration looking for blood return was conducted prior to all injections. At no point did we inject any substances, as a needle was being advanced. Before injecting, the patient was told to immediately notify me if she was experiencing any new onset of "ringing in the ears, or metallic taste in the mouth". No attempts were made at seeking any paresthesias. Safe injection practices and needle disposal techniques used. Medications properly checked for expiration dates. SDV (single dose vial) medications used. After the completion of the procedure, all disposable equipment used was discarded in the proper designated medical waste containers. Local Anesthesia: Protocol guidelines were followed. The patient was positioned over the fluoroscopy table. The area was prepped in the usual manner. The time-out was completed. The target area was identified using fluoroscopy. A 12-in long, straight, sterile hemostat was used with fluoroscopic guidance to locate the targets for each level blocked. Once located, the skin was marked with an approved surgical skin marker. Once all sites were  marked, the skin (epidermis, dermis, and hypodermis), as well as deeper tissues (fat, connective tissue and muscle) were infiltrated with a small amount of a short-acting local anesthetic, loaded on a 10cc syringe with a 25G, 1.5-in  Needle. An appropriate amount of time was allowed for local anesthetics to take effect before proceeding to the next step. Local Anesthetic: Lidocaine 2.0% The unused portion of the local anesthetic was discarded in the proper designated containers. Technical explanation of process:   L3 Medial Branch Nerve Block (MBB): The target area for the L3 medial branch is at the junction of the postero-lateral aspect of the superior articular process and the superior, posterior, and medial edge of the transverse process of L4. Under fluoroscopic guidance, a Quincke needle was inserted until contact was made with os over the superior postero-lateral aspect of the pedicular shadow (target area). After negative aspiration for blood, 0.5 mL of the nerve block solution was injected without difficulty or complication. The needle was removed intact. L4 Medial Branch Nerve Block (MBB): The target area for the L4 medial branch is at the junction of the postero-lateral aspect of the superior articular process and the superior, posterior, and medial edge of the transverse process of L5. Under fluoroscopic guidance, a Quincke needle was inserted until contact was made with os over the superior postero-lateral aspect of the pedicular shadow (target area). After negative aspiration for blood, 0.5 mL of the nerve block solution was injected without difficulty or complication. The needle was removed intact. L5 Medial Branch Nerve Block (MBB): The target area for the L5 medial branch is at the junction of the postero-lateral aspect of the superior articular process and the superior, posterior, and medial edge of the sacral ala. Under fluoroscopic guidance, a Quincke needle was inserted until contact was  made with os over the superior postero-lateral aspect of the pedicular shadow (target area). After negative aspiration for blood, 0.5 mL of the nerve block solution was injected without difficulty or complication. The needle was removed intact. S1 Medial Branch Nerve Block (MBB): The target area for the S1 medial branch is at the posterior and inferior 6 o'clock position of the L5-S1 facet joint. Under fluoroscopic guidance, the Quincke needle inserted for the L5 MBB was redirected until contact was made with os over the inferior and postero aspect of the sacrum, at the 6 o' clock position under the L5-S1 facet joint (Target area). After  negative aspiration for blood, 0.5 mL of the nerve block solution was injected without difficulty or complication. The needle was removed intact.  Nerve block solution: 0.2% PF-Ropivacaine + Triamcinolone (40 mg/mL) diluted to a final concentration of 4 mg of Triamcinolone/mL of Ropivacaine The unused portion of the solution was discarded in the proper designated containers. Procedural Needles: 22-gauge, 3.5-inch, Quincke needles used for all levels.  Once the entire procedure was completed, the treated area was cleaned, making sure to leave some of the prepping solution back to take advantage of its long term bactericidal properties.      Illustration of the posterior view of the lumbar spine and the posterior neural structures. Laminae of L2 through S1 are labeled. DPRL5, dorsal primary ramus of L5; DPRS1, dorsal primary ramus of S1; DPR3, dorsal primary ramus of L3; FJ, facet (zygapophyseal) joint L3-L4; I, inferior articular process of L4; LB1, lateral branch of dorsal primary ramus of L1; IAB, inferior articular branches from L3 medial branch (supplies L4-L5 facet joint); IBP, intermediate branch plexus; MB3, medial branch of dorsal primary ramus of L3; NR3, third lumbar nerve root; S, superior articular process of L5; SAB, superior articular branches from L4  (supplies L4-5 facet joint also); TP3, transverse process of L3.  Vitals:   04/13/20 0933 04/13/20 0940 04/13/20 0950 04/13/20 1000  BP: 119/78 111/73 109/90 113/71  Pulse: 96     Resp: 15 18 16 16   Temp:  (!) 97.4 F (36.3 C)  (!) 97.3 F (36.3 C)  TempSrc:  Temporal  Temporal  SpO2: 100% 100% 100% 99%  Weight:      Height:         Start Time: 0920 hrs. End Time: 0931 hrs.  Imaging Guidance (Spinal):          Type of Imaging Technique: Fluoroscopy Guidance (Spinal) Indication(s): Assistance in needle guidance and placement for procedures requiring needle placement in or near specific anatomical locations not easily accessible without such assistance. Exposure Time: Please see nurses notes. Contrast: None used. Fluoroscopic Guidance: I was personally present during the use of fluoroscopy. "Tunnel Vision Technique" used to obtain the best possible view of the target area. Parallax error corrected before commencing the procedure. "Direction-depth-direction" technique used to introduce the needle under continuous pulsed fluoroscopy. Once target was reached, antero-posterior, oblique, and lateral fluoroscopic projection used confirm needle placement in all planes. Images permanently stored in EMR. Interpretation: No contrast injected. I personally interpreted the imaging intraoperatively. Adequate needle placement confirmed in multiple planes. Permanent images saved into the patient's record.  Antibiotic Prophylaxis:   Anti-infectives (From admission, onward)   None     Indication(s): None identified  Post-operative Assessment:  Post-procedure Vital Signs:  Pulse/HCG Rate: 9687 Temp: (!) 97.3 F (36.3 C) Resp: 16 BP: 113/71 SpO2: 99 %  EBL: None  Complications: No immediate post-treatment complications observed by team, or reported by patient.  Note: The patient tolerated the entire procedure well. A repeat set of vitals were taken after the procedure and the patient was  kept under observation following institutional policy, for this type of procedure. Post-procedural neurological assessment was performed, showing return to baseline, prior to discharge. The patient was provided with post-procedure discharge instructions, including a section on how to identify potential problems. Should any problems arise concerning this procedure, the patient was given instructions to immediately contact us, at any time, without hesitation. In any case, we plan to contact the patient by telephone for a follow-up status report regarding this interventional  procedure.  Comments:  During today's procedure the patient demonstrated being extremely sensitive to stimulation where she would scream when local anesthetic was infiltrated in the skin.  This was after having received the glycopyrrolate, 50 mg of IV Benadryl, fentanyl, and Versed as a sedation.  The fact of the matter is that the patient has a lot of tolerance to these medications and even with the sedatives that we provided intravenously, at no point that she fall asleep.  In addition, while doing the procedure, I noticed that the most sensitive area to injection was that of the left L4-5 facet joint.  At no point did she experience a paresthesia, but she did experience quite a bit of pressure and discomfort in the area that she indicated was exactly reproducing her usual symptoms.  Plan of Care  Orders:  Orders Placed This Encounter  Procedures  . LUMBAR FACET(MEDIAL BRANCH NERVE BLOCK) MBNB    Scheduling Instructions:     Procedure: Lumbar facet block (AKA.: Lumbosacral medial branch nerve block)     Side: Bilateral     Level: L3-4, L4-5, & L5-S1 Facets (L2, L3, L4, L5, & S1 Medial Branch Nerves)     Sedation: Patient's choice.     Timeframe: Today    Order Specific Question:   Where will this procedure be performed?    Answer:   [facility]  . DG PA[facility] C-ARM 1-60 MIN NO REPORT    Intraoperative interpretation  by procedural physician at [place].    Standing Status:   Standing    Number of Occurrences:   1    Order Specific Question:   Reason for exam:    Answer:   Assistance in needle guidance and placement for procedures requiring needle placement in or near specific anatomical locations not easily accessible without such assistance.  . Informed Consent Details: Physician/Practitioner Attestation; Transcribe to consent form and obtain patient signature    Nursing Order: Transcribe to consent form and obtain patient signature. Note: Always confirm laterality of pain with Ms. Spain, before procedure.    Order Specific Question:   Physician/Practitioner attestation of informed consent for procedure/surgical case    Answer:   I, the physician/practitioner, attest that I have discussed with the patient the benefits, risks, side effects, alternatives, likelihood of achieving goals and potential problems during recovery for the procedure that I have provided informed consent.    Order Specific Question:   Procedure    Answer:   Lumbar Facet Block  under fluoroscopic guidance    Order Specific Question:   Physician/Practitioner performing the procedure    Answer:   Avamae Dehaan A. Dossie Arbour MD    Order Specific Question:   Indication/Reason    Answer:   Low Back Pain, with our without leg pain, due to Facet Joint Arthralgia (Joint Pain) Spondylosis (Arthritis of the Spine), without myelopathy or radiculopathy (Nerve Damage).  . Provide equipment / supplies at bedside    "Block Tray" (Disposable  single use) Needle type: SpinalSpinal Amount/quantity: 4 Size: Medium (5-inch) Gauge: 22G    Standing Status:   Standing    Number of Occurrences:   1    Order Specific Question:   Specify    Answer:   Block Tray   Chronic Opioid Analgesic:  None from our practice due to benzodiazepine use and high risk. The patient is also taking Marinol.   Medications ordered for procedure: Meds ordered this  encounter  Medications  . lidocaine (XYLOCAINE) 2 % (  with pres) injection 400 mg  . lactated ringers infusion 1,000 mL  . midazolam (VERSED) 5 MG/5ML injection 1-2 mg    Make sure Flumazenil is available in the pyxis when using this medication. If oversedation occurs, administer 0.2 mg IV over 15 sec. If after 45 sec no response, administer 0.2 mg again over 1 min; may repeat at 1 min intervals; not to exceed 4 doses (1 mg)  . fentaNYL (SUBLIMAZE) injection 25-50 mcg    Make sure Narcan is available in the pyxis when using this medication. In the event of respiratory depression (RR< 8/min): Titrate NARCAN (naloxone) in increments of 0.1 to 0.2 mg IV at 2-3 minute intervals, until desired degree of reversal.  . glycopyrrolate (ROBINUL) injection 0.2 mg  . ropivacaine (PF) 2 mg/mL (0.2%) (NAROPIN) injection 18 mL  . triamcinolone acetonide (KENALOG-40) injection 80 mg  . diphenhydrAMINE (BENADRYL) injection 12.5 mg   Medications administered: We administered lidocaine, lactated ringers, midazolam, fentaNYL, glycopyrrolate, ropivacaine (PF) 2 mg/mL (0.2%), triamcinolone acetonide, and diphenhydrAMINE.  See the medical record for exact dosing, route, and time of administration.  Follow-up plan:   Return in 2 weeks (on 04/27/2020) for on afternoon of procedure day, (VV), (PPE).       Interventional Therapies  Risk  Complexity Considerations:   Estimated body mass index is 43.94 kg/m as calculated from the following:   Height as of 12/16/19: 5\' 4"  (1.626 m).   Weight as of 12/16/19: 256 lb (116.1 kg). NOTE: NO Lumbar RFA until BMI<35. History of vasovagal tendencies  High risk for SUD.   Planned  Pending:   Pending further evaluation   Under consideration:   Diagnosticmidline thoracic ESI Diagnosticbilateral thoracic facet block  Diagnostic right sided LESI Diagnosticleft knee genicular NB Diagnosticright occipital NB   Completed:   Diagnostic right cervical ESI x4  (01/23/2017)  Diagnostic left lumbar facet block x4 (08/31/2019)  Diagnostic right lumbar facet block x3 (08/31/2019)  Diagnostic bilateral cervical facet block x1 (05/27/2017) (w/o Steroids)  Diagnostic bilateral IA knee joint injection x1 (11/02/2019) (w/o Steroids)    Therapeutic  Palliative (PRN) options:   Palliative bilateral lumbar facet MBB #[id number]      Recent Visits Date Type Provider Dept  04/05/20 Telemedicine Milinda Pointer, MD [facility]  Showing recent visits within past 90 days and meeting all other requirements Today's Visits Date Type Provider Dept  04/13/20 Procedure visit Milinda Pointer, MD [facility]  Showing today's visits and meeting all other requirements Future Appointments Date Type Provider Dept  05/02/20 Appointment Milinda Pointer, MD [facility]  Showing future appointments within next 90 days and meeting all other requirements  Disposition: Discharge home  Discharge (Date  Time): 04/13/2020; 1001 hrs.   Primary Care Physician: Leeanne Rio, MD Location: [facility] Note by: Gaspar Cola, MD Date: 04/13/2020; Time: 11:35 AM  Disclaimer:  Medicine is not an exact science. The only guarantee in medicine is that nothing is guaranteed. It is important to note that the decision to proceed with this intervention was based on the information collected from the patient. The Data and conclusions were drawn from the patient's questionnaire, the interview, and the physical examination. Because the information was provided in large part by the patient, it cannot be guaranteed that it has not been purposely or unconsciously manipulated. Every effort has been made to obtain as much relevant data as possible for this evaluation. It is important to note that the conclusions that  lead to this procedure are derived in large part from the available data. Always take into account that the treatment  will also be dependent on availability of resources and existing treatment guidelines, considered by other Pain Management Practitioners as being common knowledge and practice, at the time of the intervention. For Medico-Legal purposes, it is also important to point out that variation in procedural techniques and pharmacological choices are the acceptable norm. The indications, contraindications, technique, and results of the above procedure should only be interpreted and judged by a Board-Certified Interventional Pain Specialist with extensive familiarity and expertise in the same exact procedure and technique.

## 2020-04-13 NOTE — Patient Instructions (Signed)

## 2020-04-14 ENCOUNTER — Telehealth: Payer: Self-pay

## 2020-04-14 NOTE — Telephone Encounter (Signed)
Post procedure phone call.  Patient states she is doing well.  

## 2020-04-17 ENCOUNTER — Other Ambulatory Visit: Payer: Self-pay | Admitting: Nurse Practitioner

## 2020-04-17 DIAGNOSIS — R112 Nausea with vomiting, unspecified: Secondary | ICD-10-CM

## 2020-04-17 DIAGNOSIS — K219 Gastro-esophageal reflux disease without esophagitis: Secondary | ICD-10-CM

## 2020-04-18 ENCOUNTER — Telehealth: Payer: Self-pay | Admitting: Internal Medicine

## 2020-04-18 ENCOUNTER — Telehealth: Payer: Self-pay | Admitting: Neurology

## 2020-04-18 DIAGNOSIS — F3132 Bipolar disorder, current episode depressed, moderate: Secondary | ICD-10-CM | POA: Diagnosis not present

## 2020-04-18 DIAGNOSIS — G35 Multiple sclerosis: Secondary | ICD-10-CM

## 2020-04-18 DIAGNOSIS — F902 Attention-deficit hyperactivity disorder, combined type: Secondary | ICD-10-CM | POA: Diagnosis not present

## 2020-04-18 DIAGNOSIS — F064 Anxiety disorder due to known physiological condition: Secondary | ICD-10-CM | POA: Diagnosis not present

## 2020-04-18 DIAGNOSIS — M5030 Other cervical disc degeneration, unspecified cervical region: Secondary | ICD-10-CM

## 2020-04-18 DIAGNOSIS — F4312 Post-traumatic stress disorder, chronic: Secondary | ICD-10-CM | POA: Diagnosis not present

## 2020-04-18 DIAGNOSIS — G8929 Other chronic pain: Secondary | ICD-10-CM

## 2020-04-18 NOTE — Telephone Encounter (Signed)
Patient requesting a referral to [facility] instead of where she was sent.

## 2020-04-18 NOTE — Telephone Encounter (Signed)
PATIENT NEEDS HER MARINOL PRESCRIPTION REFILL SENT TO [facility]

## 2020-04-18 NOTE — Telephone Encounter (Signed)
PATIENT SAID MARINOL IS STILL NOT AT HER PHARMACY

## 2020-04-18 NOTE — Telephone Encounter (Signed)
Previous message has been sent to pts pharmacy.

## 2020-04-18 NOTE — Telephone Encounter (Signed)
Refill request for Marinol 2.5 mg requested per pt.

## 2020-04-18 NOTE — Telephone Encounter (Signed)
Pt advised to ask pcp or pain management to add order for Pt.

## 2020-04-18 NOTE — Telephone Encounter (Signed)
Order added.   Per pt she is unable to do the MRI until she can schedule around her sister's schedule.

## 2020-04-18 NOTE — Telephone Encounter (Signed)
Pls cancel order and let patient know that the order was sent by Dr. Lorin Mercy on 03/30/20, she has to let them know about the change in location she wants. Thanks

## 2020-04-19 ENCOUNTER — Telehealth: Payer: Self-pay | Admitting: Neurology

## 2020-04-19 ENCOUNTER — Telehealth: Payer: Self-pay | Admitting: Pain Medicine

## 2020-04-19 NOTE — Telephone Encounter (Signed)
Done

## 2020-04-19 NOTE — Telephone Encounter (Signed)
Yes, this has been discussed before, response is the same as last time:  I understand her situation however it is not safe to do recurrent intubation, this increases the risk for trauma and swelling of her throat, vocal cord paralysis, and pneumonia. The risks are too great that [name] cannot recommend splitting them up.

## 2020-04-19 NOTE — Telephone Encounter (Signed)
Patient called requesting a call back to help her with MRI orders with sedation. She'd like to break them up and space them out to better accommodate her childcare needs.

## 2020-04-19 NOTE — Telephone Encounter (Signed)
Patient wants to go to [facility]

## 2020-04-19 NOTE — Telephone Encounter (Signed)
Spoke with patient today and she is having a problem with PT, she first went to a center that was not suitable to her.  She desires to have her PT done at [facility] outpatient rehabilitation center.  There is much confusion with patient, I can only see a referral by [name].  She thought that Dr Dossie Arbour had ordered the PT.  I told her I would ask if Dr Dossie Arbour has referred her to PT. Also, there is some confusion about whether this is a home health basis or going.   Dr Dossie Arbour has replied and he has said that he did not refer the patient to PT and would not refer.   [facility] outpatient rehabilitation.   Advised patient that she should call this place, where she went before and was unhappy with care.  Told her to call and see who referred here there and then call their office.  I felt like she has to leads [name] or Dr Lorin Mercy.

## 2020-04-19 NOTE — Telephone Encounter (Signed)
Spoke with pt informed her that DrAquino understandsher situation however it is not safe to do recurrent intubation, this increases the risk for trauma and swelling of her throat, vocal cord paralysis, and pneumonia. The risks are too great that Dr Anselmo Rod recommend splitting them up.pt verbalized understanding, pt given the number to central scheduling to try and get MRI rescheduled

## 2020-04-20 ENCOUNTER — Telehealth: Payer: Self-pay | Admitting: Neurology

## 2020-04-20 ENCOUNTER — Other Ambulatory Visit: Payer: Self-pay

## 2020-04-20 DIAGNOSIS — M47816 Spondylosis without myelopathy or radiculopathy, lumbar region: Secondary | ICD-10-CM

## 2020-04-20 DIAGNOSIS — M4802 Spinal stenosis, cervical region: Secondary | ICD-10-CM

## 2020-04-20 NOTE — Telephone Encounter (Signed)
Patient wants to speak to someone about her medication RX being a 90 day supply and the pharmacy giving her 60 day supply

## 2020-04-24 ENCOUNTER — Telehealth: Payer: Self-pay | Admitting: Neurology

## 2020-04-24 DIAGNOSIS — M545 Low back pain, unspecified: Secondary | ICD-10-CM | POA: Diagnosis not present

## 2020-04-24 DIAGNOSIS — G8929 Other chronic pain: Secondary | ICD-10-CM | POA: Diagnosis not present

## 2020-04-24 DIAGNOSIS — W19XXXA Unspecified fall, initial encounter: Secondary | ICD-10-CM | POA: Diagnosis not present

## 2020-04-24 DIAGNOSIS — Z8616 Personal history of COVID-19: Secondary | ICD-10-CM | POA: Diagnosis not present

## 2020-04-24 DIAGNOSIS — M549 Dorsalgia, unspecified: Secondary | ICD-10-CM | POA: Diagnosis not present

## 2020-04-24 DIAGNOSIS — S300XXA Contusion of lower back and pelvis, initial encounter: Secondary | ICD-10-CM | POA: Diagnosis not present

## 2020-04-24 DIAGNOSIS — W500XXA Accidental hit or strike by another person, initial encounter: Secondary | ICD-10-CM | POA: Diagnosis not present

## 2020-04-24 DIAGNOSIS — M47816 Spondylosis without myelopathy or radiculopathy, lumbar region: Secondary | ICD-10-CM | POA: Diagnosis not present

## 2020-04-24 NOTE — Telephone Encounter (Signed)
Pt had questions about her Botox injection visit. Pt wanted to make sure she will be okay after her visit and how the visit will be.    Pt advised that the visit shouldn't be long, if she has any questions about side effects of Botox Please ask at the time of visit to ensure that you feel comfortable getting the injection.

## 2020-04-24 NOTE — Telephone Encounter (Signed)
Patient returned call to [facility].  Also, she has rescheduled to 06/08/20 for her "3 series MRI."

## 2020-04-24 NOTE — Telephone Encounter (Signed)
Per pt she needs the records faxed over. Referral placed in 2019.  Last office notes  faxed to [facility].

## 2020-04-24 NOTE — Telephone Encounter (Signed)
1. Patient requesting recent visit notes to be faxed to [facility] in connection with her referral.   2. MRI scheduled 06/08/20 per patient. 06/06/20 is the pre-screening appointment.

## 2020-04-24 NOTE — Telephone Encounter (Signed)
Tried calling pt, per pt unable to concentrate she is cooking will call back when she is done.

## 2020-04-25 ENCOUNTER — Telehealth: Payer: Self-pay

## 2020-04-25 NOTE — Telephone Encounter (Signed)
IC LMVM advising patient to call us back about being worked in.

## 2020-04-25 NOTE — Telephone Encounter (Signed)
Patient called she was seen in the ed she stated her autistic daughter ran head first into her back while patient was washing dishes patient stated she walked into the bathroom and as she was turning off the light switch she fell to the ground, she stated when she fell her daughter jumped onto her back thinking she wanted to play patient stated she is having so much pain, when she tries to look down she has a electricity tingle with sharp pain coming from the side of her neck all the way to her back, she also stated she feels a cold/burning sensation patient also stated she is also having pain in her left leg she isn't sure if its from her falling or her MS patients pain level is at a 10. Patient is requesting to be worked into [employer] schedule this week in the [place] office call back:[phone]

## 2020-04-27 ENCOUNTER — Other Ambulatory Visit: Payer: Self-pay

## 2020-04-27 ENCOUNTER — Ambulatory Visit (INDEPENDENT_AMBULATORY_CARE_PROVIDER_SITE_OTHER): Payer: Medicare Other | Admitting: Orthopaedic Surgery

## 2020-04-27 DIAGNOSIS — Z79891 Long term (current) use of opiate analgesic: Secondary | ICD-10-CM | POA: Diagnosis not present

## 2020-04-27 DIAGNOSIS — M5441 Lumbago with sciatica, right side: Secondary | ICD-10-CM | POA: Diagnosis not present

## 2020-04-27 DIAGNOSIS — G894 Chronic pain syndrome: Secondary | ICD-10-CM

## 2020-04-27 DIAGNOSIS — G8929 Other chronic pain: Secondary | ICD-10-CM | POA: Diagnosis not present

## 2020-04-27 DIAGNOSIS — M5442 Lumbago with sciatica, left side: Secondary | ICD-10-CM

## 2020-04-27 MED ORDER — PREDNISONE 10 MG (21) PO TBPK: ORAL_TABLET | ORAL | 0 refills | Status: DC

## 2020-04-27 NOTE — Progress Notes (Signed)
Office Visit Note   Patient: Emily [name]           Date of Birth: May 12, 1980           MRN: [id number] Visit Date: 04/27/2020              Requested by: Leeanne Rio, MD [place],  [place] [id number] PCP: Leeanne Rio, MD   Assessment & Plan: Visit Diagnoses:  1. Chronic low back pain (2ry area of Pain) (Bilateral) (L>R) w/ sciatica (Bilateral)   2. Chronic pain syndrome     Plan: Prednisone written she can take 3 of the 10 mg tablets first day and then to daily for for 5 days and then save the remaining tablets if needed.  Reviewed previous MRI which showed some spondylosis mid cervical region.  Some of her upper extremity symptoms may be related to her cervical spondylosis.  She can follow-up on an as-needed basis.[place] website reviewed and discussed patient I was unable to prescribe narcotic medication for her and she should get good response in a day or so with the prednisone.  Follow-Up Instructions: Return if symptoms worsen or fail to improve.   Orders:  No orders of the defined types were placed in this encounter.  Meds ordered this encounter  Medications  . predniSONE (STERAPRED UNI-PAK 21 TAB) 10 MG (21) TBPK tablet    Sig: Take 2 po daily for 6 days, then may wean to one daily    Dispense:  21 tablet    Refill:  0      Procedures: No procedures performed   Clinical Data: No additional findings.   Subjective: Chief Complaint  Patient presents with  . Neck - Pain  . Lower Back - Pain    HPI 40 year old female with extensive history of neck back problems multiple sclerosis with multiple head lesions states she is getting ready to have the MRIs of her head her neck and her back all under anesthesia which will be a 6-hour procedure due to claustrophobia and anxiety which she states manifested as seizure activity in her.  She was seen in the ER after she states her 67-year-old ran into the back of her in a "Rhino move".  She  had sharp pain and then when she turned to reach for the door she had increase in pain.  She went to emergency room but she states they forgot to give her prednisone.  She has had it before for MS at times.  She also had a past history of pain management is on Percocet at 1 point also morphine.  Patient's treated her current problems with Robaxin.  She is requesting some pain medication.  History of epidural injections that are done every 3 months.  Review of Systems all other systems noncontributory HPI.   Objective: Vital Signs: There were no vitals taken for this visit.  Physical Exam Constitutional:      Appearance: She is well-developed.  HENT:     Head: Normocephalic.     Right Ear: External ear normal.     Left Ear: External ear normal.  Eyes:     Pupils: Pupils are equal, round, and reactive to light.  Neck:     Thyroid: No thyromegaly.     Trachea: No tracheal deviation.  Cardiovascular:     Rate and Rhythm: Normal rate.  Pulmonary:     Effort: Pulmonary effort is normal.  Abdominal:  Palpations: Abdomen is soft.  Skin:    General: Skin is warm and dry.  Neurological:     Mental Status: She is alert and oriented to person, place, and time.  Psychiatric:        Behavior: Behavior normal.     Ortho Exam patient is able to ambulate normal heel toe gait.  Slow getting from sitting to standing.  Sensation lower extremities are intact. Specialty Comments:  No specialty comments available.  Imaging: No results found.   PMFS History: Patient Active Problem List   Diagnosis Date Noted  . Abnormal MRI, lumbar spine (03/06/2017) 04/13/2020  . Chronic low back pain (Bilateral) w/o sciatica 04/04/2020  . Conversion disorder with seizures or convulsions 12/06/2019  . Arthropathy of left knee 12/01/2019  . Osteoarthritis of knees (Bilateral) 11/02/2019  . Anxiety due to invasive procedure 11/02/2019  . Unstable knee, left 10/25/2019  . S/P small bowel resection  06/14/2019  . Infected hernioplasty mesh ([place]) 03/19/2019  . 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 ADHD 02/22/2019  . Anxiety 02/22/2019  . Other chronic pain 01/08/2019  . Other cervical disc degeneration, unspecified cervical region 01/08/2019  . Personal history of nicotine dependence 01/08/2019  . S/P emergency C-section   . MS (multiple sclerosis) ([place])   . Chronic pain   . Panic attack   . Open wound of abdominal wall, anterior, complicated, sequela 89/21/1941  . Postoperative wound infection 09/20/2018  . History of substance use disorder 07/19/2018  . Chronic nausea 04/13/2018  . Nausea with vomiting 03/23/2018  . Pharmacologic therapy 03/16/2018  . Disorder of skeletal system 03/16/2018  . Problems influencing health status 03/16/2018  . Abnormal MRI, cervical spine (03/06/17) 03/16/2018  . Morbid obesity with BMI of 45.0-49.9, adult ([place]) 03/16/2018  . Chronic knee pain (Bilateral) 02/18/2018  . Cervical facet hypertrophy 04/29/2017  . Cervical facet syndrome (Bilateral) (R>L) 04/29/2017  . Spondylosis without myelopathy or radiculopathy, cervical region 04/29/2017  . Spondylosis without myelopathy or radiculopathy, lumbosacral region 03/04/2017  . Cervicogenic headache 02/10/2017  . Occipital headache 02/10/2017  . Trigger point with back pain (Left) 02/10/2017  . History of fainting (vasovagal) 02/10/2017  . History of vasovagal syncope 02/10/2017  . Neurogenic pain 01/23/2017  . Chronic musculoskeletal pain 12/02/2016  . Cervical radiculitis (Bilateral) 11/12/2016  . Chronic upper back pain (1ry area of Pain) (Bilateral) (R>L) 11/01/2016  . Chronic hand pain (3ry area of Pain) (Bilateral) (L>R) 11/01/2016  . Chronic wrist pain (3ry area of Pain) (Bilateral) (L>R) 11/01/2016  . Chronic foot/toes pain (4th area of Pain) (Bilateral) (R>L) 11/01/2016  . Chronic upper extremity pain (2ry area of Pain) (Bilateral) (L>R) 11/01/2016  . Long term prescription benzodiazepine  use 11/01/2016  . 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 Cervical foraminal stenosis (Bilateral) (C6-7) 11/01/2016  . 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 DDD (degenerative disc disease), thoracic 11/01/2016  . DDD (degenerative disc disease), lumbar 11/01/2016  . Lumbar facet arthropathy 11/01/2016  . Lumbar facet syndrome (Bilateral) (R>L) 11/01/2016  . Vitamin D deficiency 10/28/2016  . Opiate use 07/23/2016  . Chronic neck pain (1ry area of Pain) (Bilateral) (R>L) 07/23/2016  . Chronic low back pain (2ry area of Pain) (Bilateral) (L>R) w/ sciatica (Bilateral) 07/23/2016  . Chronic knee pain (5th area of Pain) (Left) 07/23/2016  . Midline thoracic back pain 07/23/2016  . Chronic lower extremity pain (Bilateral) 07/23/2016  . Incarcerated incisional hernia 02/16/2016  . Incisional hernia 02/15/2016  . Carpal tunnel  syndrome (3ry area of Pain) (Bilateral) (L>R) 11/13/2015  . Substance abuse ([place]) 09/07/2015  . Paresthesia 08/02/2015  . Chronic pain syndrome 08/02/2015  . Postpartum endometritis 07/21/2015  . PTSD (post-traumatic stress disorder) 05/16/2015  . Severe episode of recurrent major depressive disorder, without psychotic features ([place]) 05/16/2015  . GAD (generalized anxiety disorder) 05/16/2015  . Difficult intravenous access 05/08/2015  . Right optic neuritis 04/27/2015  . Conversion disorder with attacks or seizures, persistent, with psychological stressor 03/28/2015  . Migraine with aura and without status migrainosus, not intractable 03/28/2015  . Generalized anxiety disorder 03/28/2015  . Conversion disorder 03/28/2015  . HTN (hypertension) 02/27/2015  . Perforated bowel ([place]) 02/27/2015  . Depression 12/27/2014  . Multiple sclerosis ([place]) 12/27/2014  . Migraine 12/27/2014  . Seizure disorder ([place]) 12/27/2014  . Smoker 12/27/2014  . Severe obesity (BMI >= 40) ([place]) 12/27/2014  . DDD (degenerative disc disease), cervical  07/28/2014  . Major depressive disorder, single episode 11/02/2013  . Gastroesophageal reflux disease 10/25/2010  . Tonsillitis 10/25/2010   Past Medical History:  Diagnosis Date  . ADHD   . Anxiety   . Asthma as a child  . Chronic back pain   . Chronic pain   . Conversion disorder   . DDD (degenerative disc disease), cervical   . Depression   . External hemorrhoid   . Fatty liver   . Fibromyalgia   . GERD (gastroesophageal reflux disease)   . History of kidney stones   . HTN (hypertension) 02/27/2015  . JC virus antibody positive   . Migraines   . MS (multiple sclerosis) ([place])    06-2006  . Neuromuscular disorder ([place])    MS  . Neuropathy   . OA (osteoarthritis)   . Ovarian cyst   . Panic attack   . Perforated bowel ([place]) 2009   colostomy bag for 3 motnhs  . Pneumonia   . Pseudoseizures ([place])    last seizure was 3 mo ago while taking modafinil  . PTSD (post-traumatic stress disorder)   . PTSD (post-traumatic stress disorder)   . S/P emergency C-section   . Seasonal allergies   . Urinary urgency   . Vertigo   . Wears contact lenses   . Wears glasses     Family History  Problem Relation Age of Onset  . Diabetes Mother   . Hypertension Mother   . Diabetes Father   . Hypertension Father   . Arthritis Father   . Cancer Maternal Grandmother   . Cancer Maternal Grandfather   . Alcohol abuse Neg Hx   . Anxiety disorder Neg Hx   . Bipolar disorder Neg Hx   . Drug abuse Neg Hx   . Depression Neg Hx   . Colon cancer Neg Hx     Past Surgical History:  Procedure Laterality Date  . ABDOMINAL SURGERY    . ABDOMINAL WOUND EXPLORATION   03/19/2019   Abdominal Wound Exploration with Explantation of Mesh  . APPENDECTOMY    . BOWEL RESECTION  01/2007   with colostomy  . BOWEL RESECTION N/A 06/14/2019   Procedure: SMALL BOWEL RESECTION;  Surgeon: Ralene Ok, MD;  Location: [facility];  Service: General;  Laterality: N/A;  . CESAREAN SECTION N/A 07/12/2015   Procedure:  CESAREAN SECTION;  Surgeon: Guss Bunde, MD;  Location: [facility];  Service: Obstetrics;  Laterality: N/A;  . COLON SURGERY     part of colon and intestines removed; colostomy 2008  . COLONOSCOPY WITH PROPOFOL  N/A 01/29/2017   Procedure: COLONOSCOPY WITH PROPOFOL;  Surgeon: Ileana Roup, MD;  Location: [facility];  Service: General;  Laterality: N/A;  . COLOSTOMY CLOSURE  04/2007  . EVALUATION UNDER ANESTHESIA WITH HEMORRHOIDECTOMY N/A 06/11/2018   Procedure: ANORECTAL EXAM UNDER ANESTHESIA WITH HEMORRHOIDECTOMY;  Surgeon: Ileana Roup, MD;  Location: [facility];  Service: General;  Laterality: N/A;  . EXCISIONAL HEMORRHOIDECTOMY    . EXTREMITY CYST EXCISION  1994   right leg  . HERNIA REPAIR    . INCISIONAL HERNIA REPAIR N/A 02/16/2016   Procedure: HERNIA REPAIR INCISIONAL;  Surgeon: Fanny Skates, MD;  Location: [facility];  Service: General;  Laterality: N/A;  . INSERTION OF MESH  02/16/2016   Procedure: INSERTION OF MESH;  Surgeon: Fanny Skates, MD;  Location: [facility];  Service: General;;  . LAPAROSCOPY N/A 06/14/2019   Procedure: ATTEMPTED DIAGNOSTIC LAPAROSCOPY;  Surgeon: Ralene Ok, MD;  Location: [facility];  Service: General;  Laterality: N/A;  . LAPAROTOMY  03/19/2019   Procedure: Abdominal Wound Exploration with Explantation of Mesh;  Surgeon: Ralene Ok, MD;  Location: [facility];  Service: General;;  . LAPAROTOMY N/A 06/14/2019   Procedure: EXPLORATORY LAPAROTOMY;  Surgeon: Ralene Ok, MD;  Location: [facility];  Service: General;  Laterality: N/A;  . LYSIS OF ADHESION N/A 06/14/2019   Procedure: LYSIS OF ADHESIONS;  Surgeon: Ralene Ok, MD;  Location: [facility];  Service: General;  Laterality: N/A;  . RADIOLOGY WITH ANESTHESIA N/A 03/06/2017   Procedure: MRI WITH ANESTHESIA OF CERVICAL SPINE WITHOUT CONTRAST, MRI OF LUMBAR SPINE WITHOUT CONTRAST;  Surgeon: Radiologist, Medication, MD;  Location: [facility];  Service: Radiology;  Laterality: N/A;  .  RADIOLOGY WITH ANESTHESIA N/A 09/15/2018   Procedure: MRI OF BRAIN WITH AND WITHOUT CONTRAST;  Surgeon: Radiologist, Medication, MD;  Location: [facility];  Service: Radiology;  Laterality: N/A;  . RADIOLOGY WITH ANESTHESIA N/A 10/13/2018   Procedure: MRI OF BRAIN WITH AND WITHOUT CONTRAST;  Surgeon: Radiologist, Medication, MD;  Location: [facility];  Service: Radiology;  Laterality: N/A;  . RADIOLOGY WITH ANESTHESIA N/A 10/26/2018   Procedure: MRI UNDER ANESTHESIA; HEAD, CERVICAL AND THORASCIC SPINE;  Surgeon: Radiologist, Medication, MD;  Location: [facility];  Service: Radiology;  Laterality: N/A;  . SCAR REVISION  01/21/2011   Procedure: SCAR REVISION;  Surgeon: Hermelinda Dellen;  Location: [facility];  Service: Plastics;  Laterality: N/A;  exploration of scar of abdomen and repair of defect  . TOOTH EXTRACTION Left 10/2016  . TRANSRECTAL DRAINAGE OF PELVIC ABSCESS     Social History   Occupational History    Employer: OTHER    Comment: disability  Tobacco Use  . Smoking status: Former Smoker    Packs/day: 0.25    Years: 10.00    Pack years: 2.50    Types: Cigarettes    Quit date: 07/12/2015    Years since quitting: 4.7  . Smokeless tobacco: Never Used  Vaping Use  . Vaping Use: Never used  Substance and Sexual Activity  . Alcohol use: No    Alcohol/week: 0.0 standard drinks  . Drug use: No    Comment: marinol that shows up at thc  . Sexual activity: Not Currently    Partners: Male    Birth control/protection: None, Implant

## 2020-04-28 DIAGNOSIS — F064 Anxiety disorder due to known physiological condition: Secondary | ICD-10-CM | POA: Diagnosis not present

## 2020-04-28 DIAGNOSIS — F3132 Bipolar disorder, current episode depressed, moderate: Secondary | ICD-10-CM | POA: Diagnosis not present

## 2020-04-28 DIAGNOSIS — F902 Attention-deficit hyperactivity disorder, combined type: Secondary | ICD-10-CM | POA: Diagnosis not present

## 2020-04-28 DIAGNOSIS — F4312 Post-traumatic stress disorder, chronic: Secondary | ICD-10-CM | POA: Diagnosis not present

## 2020-05-01 DIAGNOSIS — H5213 Myopia, bilateral: Secondary | ICD-10-CM | POA: Diagnosis not present

## 2020-05-01 DIAGNOSIS — H469 Unspecified optic neuritis: Secondary | ICD-10-CM | POA: Diagnosis not present

## 2020-05-01 DIAGNOSIS — Z79899 Other long term (current) drug therapy: Secondary | ICD-10-CM | POA: Diagnosis not present

## 2020-05-01 DIAGNOSIS — G35 Multiple sclerosis: Secondary | ICD-10-CM | POA: Diagnosis not present

## 2020-05-02 ENCOUNTER — Telehealth (HOSPITAL_BASED_OUTPATIENT_CLINIC_OR_DEPARTMENT_OTHER): Payer: Medicare Other | Admitting: Pain Medicine

## 2020-05-02 ENCOUNTER — Telehealth: Payer: Self-pay | Admitting: Neurology

## 2020-05-02 DIAGNOSIS — Z5329 Procedure and treatment not carried out because of patient's decision for other reasons: Secondary | ICD-10-CM

## 2020-05-02 DIAGNOSIS — F4312 Post-traumatic stress disorder, chronic: Secondary | ICD-10-CM | POA: Diagnosis not present

## 2020-05-02 DIAGNOSIS — F3132 Bipolar disorder, current episode depressed, moderate: Secondary | ICD-10-CM | POA: Diagnosis not present

## 2020-05-02 DIAGNOSIS — F902 Attention-deficit hyperactivity disorder, combined type: Secondary | ICD-10-CM | POA: Diagnosis not present

## 2020-05-02 DIAGNOSIS — F064 Anxiety disorder due to known physiological condition: Secondary | ICD-10-CM | POA: Diagnosis not present

## 2020-05-02 NOTE — Telephone Encounter (Signed)
Patient is going to contact her PCP.

## 2020-05-02 NOTE — Progress Notes (Signed)
Patient canceled encounter.

## 2020-05-02 NOTE — Telephone Encounter (Signed)
New message    Pt c/o medication issue:  1. Name of Medication:  traZODone (DESYREL) 50 MG tablet  2. How are you currently taking this medication (dosage and times per day)? Take 100 mg by mouth at bedtime.  3. Are you having a reaction (difficulty breathing--STAT)? No   4. What is your medication issue? Patient psychiatrist Dr. Sharon Seller wants her to starts Lunesta advise patient to called the office speak with her MD.   Dr. Alroy Dust wants to discontinue medication.   Can leave a voicemail due to daughter therapy session

## 2020-05-02 NOTE — Telephone Encounter (Signed)
Patient called back with concerns about starting Lunesta due to side effects. She needs to speak with a nurse about this.

## 2020-05-02 NOTE — Telephone Encounter (Signed)
I do not manage sleep disorders, she will have to continue working with her psychiatrist on this. If not, we can try to refer her to a sleep specialist (pulmonary). thanks

## 2020-05-02 NOTE — Telephone Encounter (Signed)
Psych is asking for [name] to take over her sleep problems, pt is worried about taken the [facility] because of the side effects, she stated that because [name] is the neurologist she needs to pick it up. Pt informed that [name] does not see you for sleep. Pt is asking for [place] to call Dr Alroy Dust and see what she wants to do. Pt advised to have her notes faxed to our office

## 2020-05-02 NOTE — Telephone Encounter (Signed)
Spoke with pt that once [name] is back in the office we will see what she wants

## 2020-05-02 NOTE — Telephone Encounter (Signed)
Dr. Delice Lesch can address more when she gets back but we usually don't treat sleep here, esp in someone with a pain management physician and a psychiatrist and a PCP.

## 2020-05-08 ENCOUNTER — Telehealth: Payer: Self-pay | Admitting: Neurology

## 2020-05-08 NOTE — Telephone Encounter (Signed)
New message    1. Which medications need to be refilled? (please list name of each medication and dose if known) divalproex (DEPAKOTE) 500 MG DR tablet & topiramate (TOPAMAX) 100 MG tablet  2. Which pharmacy/location (including street and city if local pharmacy) is medication to be sent to?[facility] [place]  3. Upcoming appt on  7.5.2022

## 2020-05-09 ENCOUNTER — Other Ambulatory Visit: Payer: Self-pay

## 2020-05-09 ENCOUNTER — Ambulatory Visit (HOSPITAL_COMMUNITY): Payer: Medicare Other | Attending: Orthopaedic Surgery | Admitting: Physical Therapy

## 2020-05-09 ENCOUNTER — Encounter (HOSPITAL_COMMUNITY): Payer: Self-pay | Admitting: Physical Therapy

## 2020-05-09 DIAGNOSIS — M542 Cervicalgia: Secondary | ICD-10-CM | POA: Insufficient documentation

## 2020-05-09 DIAGNOSIS — M6281 Muscle weakness (generalized): Secondary | ICD-10-CM | POA: Diagnosis not present

## 2020-05-09 DIAGNOSIS — F3132 Bipolar disorder, current episode depressed, moderate: Secondary | ICD-10-CM | POA: Diagnosis not present

## 2020-05-09 DIAGNOSIS — M5442 Lumbago with sciatica, left side: Secondary | ICD-10-CM | POA: Diagnosis not present

## 2020-05-09 DIAGNOSIS — G8929 Other chronic pain: Secondary | ICD-10-CM

## 2020-05-09 DIAGNOSIS — F4312 Post-traumatic stress disorder, chronic: Secondary | ICD-10-CM | POA: Diagnosis not present

## 2020-05-09 DIAGNOSIS — F902 Attention-deficit hyperactivity disorder, combined type: Secondary | ICD-10-CM | POA: Diagnosis not present

## 2020-05-09 DIAGNOSIS — F064 Anxiety disorder due to known physiological condition: Secondary | ICD-10-CM | POA: Diagnosis not present

## 2020-05-09 NOTE — Therapy (Signed)
[place], [place], [address] Phone: [phone]   Fax:  [phone]  Physical Therapy Evaluation  Patient Details  Name: Emily [name] MRN: [id number] Date of Birth: 1980-11-19 Referring Provider (PT): Rodell Perna   Encounter Date: 05/09/2020   PT End of Session - 05/09/20 1323    Visit Number 1    Number of Visits 8    Date for PT Re-Evaluation 07/04/20    Authorization Type medicare primary and secondary medicaid    Progress Note Due on Visit 10    PT Start Time 1316    PT Stop Time 1400    PT Time Calculation (min) 44 min    Activity Tolerance Patient tolerated treatment well    Behavior During Therapy [facility] for tasks assessed/performed           Past Medical History:  Diagnosis Date  . ADHD   . Anxiety   . Asthma as a child  . Chronic back pain   . Chronic pain   . Conversion disorder   . DDD (degenerative disc disease), cervical   . Depression   . External hemorrhoid   . Fatty liver   . Fibromyalgia   . GERD (gastroesophageal reflux disease)   . History of kidney stones   . HTN (hypertension) 02/27/2015  . JC virus antibody positive   . Migraines   . MS (multiple sclerosis) ([place])    06-2006  . Neuromuscular disorder ([place])    MS  . Neuropathy   . OA (osteoarthritis)   . Ovarian cyst   . Panic attack   . Perforated bowel ([place]) 2009   colostomy bag for 3 motnhs  . Pneumonia   . Pseudoseizures ([place])    last seizure was 3 mo ago while taking modafinil  . PTSD (post-traumatic stress disorder)   . PTSD (post-traumatic stress disorder)   . S/P emergency C-section   . Seasonal allergies   . Urinary urgency   . Vertigo   . Wears contact lenses   . Wears glasses     Past Surgical History:  Procedure Laterality Date  . ABDOMINAL SURGERY    . ABDOMINAL WOUND EXPLORATION   03/19/2019   Abdominal Wound Exploration with Explantation of Mesh  . APPENDECTOMY    . BOWEL RESECTION  01/2007    with colostomy  . BOWEL RESECTION N/A 06/14/2019   Procedure: SMALL BOWEL RESECTION;  Surgeon: Ralene Ok, MD;  Location: [place];  Service: General;  Laterality: N/A;  . CESAREAN SECTION N/A 07/12/2015   Procedure: CESAREAN SECTION;  Surgeon: Guss Bunde, MD;  Location: [place];  Service: Obstetrics;  Laterality: N/A;  . COLON SURGERY     part of colon and intestines removed; colostomy 2008  . COLONOSCOPY WITH PROPOFOL N/A 01/29/2017   Procedure: COLONOSCOPY WITH PROPOFOL;  Surgeon: Ileana Roup, MD;  [facility];  Service: General;  Laterality: N/A;  . COLOSTOMY CLOSURE  04/2007  . EVALUATION UNDER ANESTHESIA WITH HEMORRHOIDECTOMY N/A 06/11/2018   Procedure: ANORECTAL EXAM UNDER ANESTHESIA WITH HEMORRHOIDECTOMY;  Surgeon: Ileana Roup, MD;  Location: [facility];  Service: General;  Laterality: N/A;  . EXCISIONAL HEMORRHOIDECTOMY    . EXTREMITY CYST EXCISION  1994   right leg  . HERNIA REPAIR    . INCISIONAL HERNIA REPAIR N/A 02/16/2016   Procedure: HERNIA REPAIR INCISIONAL;  Surgeon: Fanny Skates, MD;  [facility];  Service: General;  Laterality: N/A;  .  INSERTION OF MESH  02/16/2016   Procedure: INSERTION OF MESH;  Surgeon: Fanny Skates, MD;  [facility];  Service: General;;  . LAPAROSCOPY N/A 06/14/2019   Procedure: ATTEMPTED DIAGNOSTIC LAPAROSCOPY;  Surgeon: Ralene Ok, MD;  Location: [place];  Service: General;  Laterality: N/A;  . LAPAROTOMY  03/19/2019   Procedure: Abdominal Wound Exploration with Explantation of Mesh;  Surgeon: Ralene Ok, MD;  Location: [place];  Service: General;;  . LAPAROTOMY N/A 06/14/2019   Procedure: EXPLORATORY LAPAROTOMY;  Surgeon: Ralene Ok, MD;  Location: [place];  Service: General;  Laterality: N/A;  . LYSIS OF ADHESION N/A 06/14/2019   Procedure: LYSIS OF ADHESIONS;  Surgeon: Ralene Ok, MD;  Location: [place];  Service: General;  Laterality: N/A;  . RADIOLOGY WITH ANESTHESIA  N/A 03/06/2017   Procedure: MRI WITH ANESTHESIA OF CERVICAL SPINE WITHOUT CONTRAST, MRI OF LUMBAR SPINE WITHOUT CONTRAST;  Surgeon: Radiologist, Medication, MD;  Location: [facility];  Service: Radiology;  Laterality: N/A;  . RADIOLOGY WITH ANESTHESIA N/A 09/15/2018   Procedure: MRI OF BRAIN WITH AND WITHOUT CONTRAST;  Surgeon: Radiologist, Medication, MD;  Location: [place];  Service: Radiology;  Laterality: N/A;  . RADIOLOGY WITH ANESTHESIA N/A 10/13/2018   Procedure: MRI OF BRAIN WITH AND WITHOUT CONTRAST;  Surgeon: Radiologist, Medication, MD;  Location: [place];  Service: Radiology;  Laterality: N/A;  . RADIOLOGY WITH ANESTHESIA N/A 10/26/2018   Procedure: MRI UNDER ANESTHESIA; HEAD, CERVICAL AND THORASCIC SPINE;  Surgeon: Radiologist, Medication, MD;  Location: [place];  Service: Radiology;  Laterality: N/A;  . SCAR REVISION  01/21/2011   Procedure: SCAR REVISION;  Surgeon: Hermelinda Dellen;  Location: [place];  Service: Plastics;  Laterality: N/A;  exploration of scar of abdomen and repair of defect  . TOOTH EXTRACTION Left 10/2016  . TRANSRECTAL DRAINAGE OF PELVIC ABSCESS      There were no vitals filed for this visit.    Subjective Assessment - 05/09/20 1331    Subjective States that she has chronic pain. States she has a lot of stuff wrong in her back both neck and low back. States she also has MS. States her knees are also messing her up her left is worse then her right. State her left one gives out on her a lot. States that he low back pain has been managed with injections. States 2 weeks after her last back injection her daughter ran into her and her pain increased. States her back pain is the worse. States she 2018 and 2021 she has 4 abdominal surgeries. States she isn't currently taking any pain meds. Bending forward bothers her backs and she feels like it seizes up when she bends over and then comes back upright.    Pertinent History anxiety, MS, fibromyalgia    Limitations  Lifting;Standing;Walking;House hold activities    Diagnostic tests xray    Patient Stated Goals to be able to move around a little bit better, being able to bend over to change daughter's diaper. Being able to bend neck to her chest without severe pain    Currently in Pain? Yes    Pain Score 7     Pain Location Back    Pain Orientation Lower    Pain Descriptors / Indicators Aching;Sharp    Pain Type Chronic pain    Pain Radiating Towards goes down left back of leg    Pain Onset More than a month ago    Pain Frequency Constant    Aggravating Factors  bending, lifting    Pain Relieving Factors sitting, rest              [facility] PT Assessment - 05/09/20 0001      Assessment   Medical Diagnosis neck and low back pain    Referring Provider (PT) Rodell Perna    Next MD Visit as needed    Prior Therapy yes a long time ago      Balance Screen   Has the patient fallen in the past 6 months Yes    How many times? 1    Has the patient had a decrease in activity level because of a fear of falling?  Yes    Is the patient reluctant to leave their home because of a fear of falling?  No      Home [employer] residence      Prior Function   Level of Independence Independent      Observation/Other Assessments   Focus on Therapeutic Outcomes (FOTO)  37% function for lumbar, 44% function for neck      ROM / Strength   AROM / PROM / Strength AROM;Strength      AROM   AROM Assessment Site Cervical;Lumbar    Cervical Flexion 18   pain, popping in neck   Cervical Extension 25    Cervical - Right Side Bend 30   crunching   Cervical - Left Side Bend 22   catching on left side   Cervical - Right Rotation 55   pulling along back of neck   Cervical - Left Rotation 32   pulling along back of neck   Lumbar Flexion 50% limited - pain/ pulling down back    Lumbar Extension 75% limited   catching pain in back   Lumbar - Right Side Bend 50% limited    Lumbar - Left Side  Bend 75% limited      Strength   Strength Assessment Site Hip;Knee;Ankle    Right/Left Hip Left;Right    Right Hip Flexion 4/5   slight pain in hip   Left Hip Flexion 4/5   cramp and pain in buttocks   Right/Left Knee Left;Right    Right Knee Flexion 4-/5   slight pain back of knee   Right Knee Extension 4-/5   slight pain back of knee   Left Knee Flexion 3+/5   slight pain back of knee   Left Knee Extension 3+/5   slight pain back of knee   Right/Left Ankle Right;Left    Right Ankle Dorsiflexion 4+/5    Left Ankle Dorsiflexion 4+/5      Ambulation/Gait   Ambulation/Gait Yes    Ambulation/Gait Assistance 6: Modified independent (Device/Increase time)    Ambulation Distance (Feet) 338 Feet    Assistive device None    Gait Pattern Decreased hip/knee flexion - right;Decreased stride length;Decreased hip/knee flexion - left;Decreased arm swing - right;Decreased arm swing - left;Step-through pattern   decreased trunk rotation,   Gait velocity decreased    Gait Comments 2MW - pain started at 1:30                      Objective measurements completed on examination: See above findings.       [facility] Adult PT Treatment/Exercise - 05/09/20 0001      Exercises   Exercises Knee/Hip      Knee/Hip Exercises: Seated   Sit to Sand 2 sets;5 reps;without UE support  Knee/Hip Exercises: Supine   Bridges --                  PT Education - 05/09/20 1413    Education Details on current condition, on POC and HEP    Person(s) Educated Patient    Methods Explanation    Comprehension Verbalized understanding            PT Short Term Goals - 05/09/20 1355      PT SHORT TERM GOAL #1   Title Patient will be able to ambulate at least 2 minutes without increase in low back pain to improve overall function    Time 4    Period Weeks    Status New    Target Date 06/06/20      PT SHORT TERM GOAL #2   Title Patient will be able to demonstrate at least 45 degrees of  cervical rotation in both directions to demonstrate improved neck mobilty    Time 4    Period Weeks    Status New    Target Date 06/06/20      PT SHORT TERM GOAL #3   Title Patient will be independent in self management strategies to improve quality of life and functional outcomes.    Time 4    Period Weeks    Status New    Target Date 06/06/20             PT Long Term Goals - 05/09/20 1405      PT LONG TERM GOAL #1   Title Patient will report at least 50% improvement in overall symptoms and/or function to demonstrate improved functional mobility    Time 8    Period Weeks    Status New    Target Date 07/04/20      PT LONG TERM GOAL #2   Title Patient will be able to bend forward with good mechanics to change her daughter's diaper to reduce stress on her lumbar spine.    Time 8    Period Weeks    Status New    Target Date 07/04/20      PT LONG TERM GOAL #3   Title Patient will improve on back FOTO score to meet predicted outcomes to demonstrate improved functional mobility.    Time 8    Period Weeks    Status New    Target Date 07/04/20                  Plan - 05/09/20 1327    Clinical Impression Statement Patient presents to therapy with chronic neck and back pain. She also presents with multiple comorbidities, fibromyalgia, MS and history of 4 abdominal surgeries that are currently affecting quality of life and function. Patient presents with limited cervical and lumbar ROM and decreased strength and currently is limited in her ability to care for her daughter. Patient would greatly benefit from skilled physical therapy to improve overall function and quality of life.    Personal Factors and Comorbidities Comorbidity 1;Comorbidity 2;Comorbidity 3+;Fitness    Comorbidities 4 abdominal surgeries (history, fibromyalgia, anxiety, MS,    Examination-Activity Limitations Bed Mobility;Bend;Caring for Others;Carry;Dressing;Lift;Locomotion Level;Reach  Overhead;Transfers;Stand;Squat    Examination-Participation Restrictions Driving;Cleaning;Meal Prep    Stability/Clinical Decision Making Evolving/Moderate complexity    Clinical Decision Making Moderate    Rehab Potential Good    PT Frequency 2x / week    PT Duration 6 weeks    PT Treatment/Interventions ADLs/Self Care Home Management;Aquatic Therapy;Biofeedback;Cryotherapy;Traction;Moist Heat;Gait training;Electrical  Stimulation;Therapeutic exercise;Therapeutic activities;Manual techniques;Patient/family education;Neuromuscular re-education;Joint Manipulations    PT Next Visit Plan neck and back ROM, isometrics, LE and core strengthening, cardio, STM for pain management as tolerated    PT Home Exercise Plan sit to stands    Consulted and Agree with Plan of Care Patient           Patient will benefit from skilled therapeutic intervention in order to improve the following deficits and impairments:  Decreased range of motion,Decreased activity tolerance,Pain,Decreased endurance,Decreased strength,Difficulty walking,Decreased mobility,Decreased balance  Visit Diagnosis: Muscle weakness (generalized)  Cervicalgia  Chronic midline low back pain with left-sided sciatica     Problem List Patient Active Problem List   Diagnosis Date Noted  . Abnormal MRI, lumbar spine (03/06/2017) 04/13/2020  . Chronic low back pain (Bilateral) w/o sciatica 04/04/2020  . Conversion disorder with seizures or convulsions 12/06/2019  . Arthropathy of left knee 12/01/2019  . Osteoarthritis of knees (Bilateral) 11/02/2019  . Anxiety due to invasive procedure 11/02/2019  . Unstable knee, left 10/25/2019  . S/P small bowel resection 06/14/2019  . Infected hernioplasty mesh ([place]) 03/19/2019  . Pseudoseizures ([place]) 02/22/2019  . ADHD 02/22/2019  . Anxiety 02/22/2019  . Other chronic pain 01/08/2019  . Other cervical disc degeneration, unspecified cervical region 01/08/2019  . Personal history of nicotine  dependence 01/08/2019  . S/P emergency C-section   . MS (multiple sclerosis) ([place])   . Chronic pain   . Panic attack   . Open wound of abdominal wall, anterior, complicated, sequela [id number]  . Postoperative wound infection 09/20/2018  . History of substance use disorder 07/19/2018  . Chronic nausea 04/13/2018  . Nausea with vomiting 03/23/2018  . Pharmacologic therapy 03/16/2018  . Disorder of skeletal system 03/16/2018  . Problems influencing health status 03/16/2018  . Abnormal MRI, cervical spine (03/06/17) 03/16/2018  . Morbid obesity with BMI of 45.0-49.9, adult ([place]) 03/16/2018  . Chronic knee pain (Bilateral) 02/18/2018  . Cervical facet hypertrophy 04/29/2017  . Cervical facet syndrome (Bilateral) (R>L) 04/29/2017  . Spondylosis without myelopathy or radiculopathy, cervical region 04/29/2017  . Spondylosis without myelopathy or radiculopathy, lumbosacral region 03/04/2017  . Cervicogenic headache 02/10/2017  . Occipital headache 02/10/2017  . Trigger point with back pain (Left) 02/10/2017  . History of fainting (vasovagal) 02/10/2017  . History of vasovagal syncope 02/10/2017  . Neurogenic pain 01/23/2017  . Chronic musculoskeletal pain 12/02/2016  . Cervical radiculitis (Bilateral) 11/12/2016  . Chronic upper back pain (1ry area of Pain) (Bilateral) (R>L) 11/01/2016  . Chronic hand pain (3ry area of Pain) (Bilateral) (L>R) 11/01/2016  . Chronic wrist pain (3ry area of Pain) (Bilateral) (L>R) 11/01/2016  . Chronic foot/toes pain (4th area of Pain) (Bilateral) (R>L) 11/01/2016  . Chronic upper extremity pain (2ry area of Pain) (Bilateral) (L>R) 11/01/2016  . Long term prescription benzodiazepine use 11/01/2016  . 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 Cervical foraminal stenosis (Bilateral) (C6-7) 11/01/2016  . 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 DDD (degenerative disc disease), thoracic 11/01/2016  . DDD (degenerative disc  disease), lumbar 11/01/2016  . Lumbar facet arthropathy 11/01/2016  . Lumbar facet syndrome (Bilateral) (R>L) 11/01/2016  . Vitamin D deficiency 10/28/2016  . Opiate use 07/23/2016  . Chronic neck pain (1ry area of Pain) (Bilateral) (R>L) 07/23/2016  . Chronic low back pain (2ry area of Pain) (Bilateral) (L>R) w/ sciatica (Bilateral) 07/23/2016  . Chronic knee pain (5th area of Pain) (Left) 07/23/2016  . Midline thoracic back pain 07/23/2016  .  Chronic lower extremity pain (Bilateral) 07/23/2016  . Incarcerated incisional hernia 02/16/2016  . Incisional hernia 02/15/2016  . Carpal tunnel syndrome (3ry area of Pain) (Bilateral) (L>R) 11/13/2015  . Substance abuse ([place]) 09/07/2015  . Paresthesia 08/02/2015  . Chronic pain syndrome 08/02/2015  . Postpartum endometritis 07/21/2015  . PTSD (post-traumatic stress disorder) 05/16/2015  . Severe episode of recurrent major depressive disorder, without psychotic features ([place]) 05/16/2015  . GAD (generalized anxiety disorder) 05/16/2015  . Difficult intravenous access 05/08/2015  . Right optic neuritis 04/27/2015  . Conversion disorder with attacks or seizures, persistent, with psychological stressor 03/28/2015  . Migraine with aura and without status migrainosus, not intractable 03/28/2015  . Generalized anxiety disorder 03/28/2015  . Conversion disorder 03/28/2015  . HTN (hypertension) 02/27/2015  . Perforated bowel ([place]) 02/27/2015  . Depression 12/27/2014  . Multiple sclerosis ([place]) 12/27/2014  . Migraine 12/27/2014  . Seizure disorder ([place]) 12/27/2014  . Smoker 12/27/2014  . Severe obesity (BMI >= 40) ([place]) 12/27/2014  . DDD (degenerative disc disease), cervical 07/28/2014  . Major depressive disorder, single episode 11/02/2013  . Gastroesophageal reflux disease 10/25/2010  . Tonsillitis 10/25/2010   2:14 PM, 05/09/20 Jerene Pitch, DPT Physical Therapy with [facility]  [phone] office  [place] [address] [place], [place], [address] Phone: [phone]   Fax:  [phone]  Name: Emily [name] MRN: [id number] Date of Birth: 07-Feb-1980

## 2020-05-10 ENCOUNTER — Encounter: Payer: Self-pay | Admitting: Pain Medicine

## 2020-05-11 ENCOUNTER — Telehealth (HOSPITAL_BASED_OUTPATIENT_CLINIC_OR_DEPARTMENT_OTHER): Payer: Medicare Other | Admitting: Pain Medicine

## 2020-05-11 DIAGNOSIS — M4802 Spinal stenosis, cervical region: Secondary | ICD-10-CM | POA: Diagnosis not present

## 2020-05-11 DIAGNOSIS — G35 Multiple sclerosis: Secondary | ICD-10-CM | POA: Diagnosis not present

## 2020-05-11 DIAGNOSIS — Z13220 Encounter for screening for lipoid disorders: Secondary | ICD-10-CM | POA: Diagnosis not present

## 2020-05-11 DIAGNOSIS — Z0000 Encounter for general adult medical examination without abnormal findings: Secondary | ICD-10-CM | POA: Diagnosis not present

## 2020-05-11 DIAGNOSIS — Z7289 Other problems related to lifestyle: Secondary | ICD-10-CM | POA: Diagnosis not present

## 2020-05-11 DIAGNOSIS — R7302 Impaired glucose tolerance (oral): Secondary | ICD-10-CM | POA: Diagnosis not present

## 2020-05-11 DIAGNOSIS — Z136 Encounter for screening for cardiovascular disorders: Secondary | ICD-10-CM | POA: Diagnosis not present

## 2020-05-11 DIAGNOSIS — Z5329 Procedure and treatment not carried out because of patient's decision for other reasons: Secondary | ICD-10-CM

## 2020-05-11 DIAGNOSIS — R569 Unspecified convulsions: Secondary | ICD-10-CM | POA: Diagnosis not present

## 2020-05-11 DIAGNOSIS — M47816 Spondylosis without myelopathy or radiculopathy, lumbar region: Secondary | ICD-10-CM | POA: Diagnosis not present

## 2020-05-11 DIAGNOSIS — I10 Essential (primary) hypertension: Secondary | ICD-10-CM | POA: Diagnosis not present

## 2020-05-11 DIAGNOSIS — F332 Major depressive disorder, recurrent severe without psychotic features: Secondary | ICD-10-CM | POA: Diagnosis not present

## 2020-05-16 ENCOUNTER — Ambulatory Visit (HOSPITAL_COMMUNITY): Payer: Medicare Other

## 2020-05-16 ENCOUNTER — Encounter (HOSPITAL_COMMUNITY): Payer: Self-pay

## 2020-05-16 ENCOUNTER — Other Ambulatory Visit: Payer: Self-pay

## 2020-05-16 DIAGNOSIS — G8929 Other chronic pain: Secondary | ICD-10-CM | POA: Diagnosis not present

## 2020-05-16 DIAGNOSIS — M542 Cervicalgia: Secondary | ICD-10-CM | POA: Diagnosis not present

## 2020-05-16 DIAGNOSIS — M6281 Muscle weakness (generalized): Secondary | ICD-10-CM

## 2020-05-16 DIAGNOSIS — M5442 Lumbago with sciatica, left side: Secondary | ICD-10-CM

## 2020-05-16 NOTE — Patient Instructions (Signed)
Access Code: [email or web address] URL: [email or web address] Date: 05/16/2020 Prepared by: Sherlyn Lees  Exercises Standing High Row with Resistance - 1 x daily - 7 x weekly - 3 sets - 10 reps - 3 sec hold Shoulder extension with resistance - Neutral - 1 x daily - 7 x weekly - 3 sets - 10 reps Shoulder External Rotation and Scapular Retraction with Resistance - 1 x daily - 7 x weekly - 3 sets - 10 reps

## 2020-05-16 NOTE — Therapy (Signed)
[place], [place], [address] Phone: [phone]   Fax:  [phone]  Physical Therapy Treatment  Patient Details  Name: AURIELLE SLINGERLAND MRN: [id number] Date of Birth: 02-20-1980 Referring Provider (PT): Rodell Perna   Encounter Date: 05/16/2020   PT End of Session - 05/16/20 1311    Visit Number 2    Number of Visits 8    Date for PT Re-Evaluation 07/04/20    Authorization Type medicare primary and secondary medicaid    Progress Note Due on Visit 10    PT Start Time 1300    PT Stop Time 1345    PT Time Calculation (min) 45 min    Activity Tolerance Patient tolerated treatment well    Behavior During Therapy [facility] for tasks assessed/performed           Past Medical History:  Diagnosis Date  . ADHD   . Anxiety   . Asthma as a child  . Chronic back pain   . Chronic pain   . Conversion disorder   . DDD (degenerative disc disease), cervical   . Depression   . External hemorrhoid   . Fatty liver   . Fibromyalgia   . GERD (gastroesophageal reflux disease)   . History of kidney stones   . HTN (hypertension) 02/27/2015  . JC virus antibody positive   . Migraines   . MS (multiple sclerosis) ([place])    06-2006  . Neuromuscular disorder ([place])    MS  . Neuropathy   . OA (osteoarthritis)   . Ovarian cyst   . Panic attack   . Perforated bowel ([place]) 2009   colostomy bag for 3 motnhs  . Pneumonia   . Pseudoseizures ([place])    last seizure was 3 mo ago while taking modafinil  . PTSD (post-traumatic stress disorder)   . PTSD (post-traumatic stress disorder)   . S/P emergency C-section   . Seasonal allergies   . Urinary urgency   . Vertigo   . Wears contact lenses   . Wears glasses     Past Surgical History:  Procedure Laterality Date  . ABDOMINAL SURGERY    . ABDOMINAL WOUND EXPLORATION   03/19/2019   Abdominal Wound Exploration with Explantation of Mesh  . APPENDECTOMY    . BOWEL RESECTION  01/2007    with colostomy  . BOWEL RESECTION N/A 06/14/2019   Procedure: SMALL BOWEL RESECTION;  Surgeon: Ralene Ok, MD;  Location: [facility];  Service: General;  Laterality: N/A;  . CESAREAN SECTION N/A 07/12/2015   Procedure: CESAREAN SECTION;  Surgeon: Guss Bunde, MD;  Location: [facility];  Service: Obstetrics;  Laterality: N/A;  . COLON SURGERY     part of colon and intestines removed; colostomy 2008  . COLONOSCOPY WITH PROPOFOL N/A 01/29/2017   Procedure: COLONOSCOPY WITH PROPOFOL;  Surgeon: Ileana Roup, MD;  Location: [facility];  Service: General;  Laterality: N/A;  . COLOSTOMY CLOSURE  04/2007  . EVALUATION UNDER ANESTHESIA WITH HEMORRHOIDECTOMY N/A 06/11/2018   Procedure: ANORECTAL EXAM UNDER ANESTHESIA WITH HEMORRHOIDECTOMY;  Surgeon: Ileana Roup, MD;  Location: [facility];  Service: General;  Laterality: N/A;  . EXCISIONAL HEMORRHOIDECTOMY    . EXTREMITY CYST EXCISION  1994   right leg  . HERNIA REPAIR    . INCISIONAL HERNIA REPAIR N/A 02/16/2016   Procedure: HERNIA REPAIR INCISIONAL;  Surgeon: Fanny Skates, MD;  Location: [facility];  Service: General;  Laterality: N/A;  .  INSERTION OF MESH  02/16/2016   Procedure: INSERTION OF MESH;  Surgeon: Fanny Skates, MD;  Location: [facility];  Service: General;;  . LAPAROSCOPY N/A 06/14/2019   Procedure: ATTEMPTED DIAGNOSTIC LAPAROSCOPY;  Surgeon: Ralene Ok, MD;  Location: [facility];  Service: General;  Laterality: N/A;  . LAPAROTOMY  03/19/2019   Procedure: Abdominal Wound Exploration with Explantation of Mesh;  Surgeon: Ralene Ok, MD;  Location: [facility];  Service: General;;  . LAPAROTOMY N/A 06/14/2019   Procedure: EXPLORATORY LAPAROTOMY;  Surgeon: Ralene Ok, MD;  Location: [facility];  Service: General;  Laterality: N/A;  . LYSIS OF ADHESION N/A 06/14/2019   Procedure: LYSIS OF ADHESIONS;  Surgeon: Ralene Ok, MD;  Location: [facility];  Service: General;  Laterality: N/A;  . RADIOLOGY WITH ANESTHESIA  N/A 03/06/2017   Procedure: MRI WITH ANESTHESIA OF CERVICAL SPINE WITHOUT CONTRAST, MRI OF LUMBAR SPINE WITHOUT CONTRAST;  Surgeon: Radiologist, Medication, MD;  Location: [facility];  Service: Radiology;  Laterality: N/A;  . RADIOLOGY WITH ANESTHESIA N/A 09/15/2018   Procedure: MRI OF BRAIN WITH AND WITHOUT CONTRAST;  Surgeon: Radiologist, Medication, MD;  Location: [facility];  Service: Radiology;  Laterality: N/A;  . RADIOLOGY WITH ANESTHESIA N/A 10/13/2018   Procedure: MRI OF BRAIN WITH AND WITHOUT CONTRAST;  Surgeon: Radiologist, Medication, MD;  Location: [facility];  Service: Radiology;  Laterality: N/A;  . RADIOLOGY WITH ANESTHESIA N/A 10/26/2018   Procedure: MRI UNDER ANESTHESIA; HEAD, CERVICAL AND THORASCIC SPINE;  Surgeon: Radiologist, Medication, MD;  Location: [facility];  Service: Radiology;  Laterality: N/A;  . SCAR REVISION  01/21/2011   Procedure: SCAR REVISION;  Surgeon: Hermelinda Dellen;  Location: [facility];  Service: Plastics;  Laterality: N/A;  exploration of scar of abdomen and repair of defect  . TOOTH EXTRACTION Left 10/2016  . TRANSRECTAL DRAINAGE OF PELVIC ABSCESS      There were no vitals filed for this visit.   Subjective Assessment - 05/16/20 1306    Subjective Pt notes her back has been hurting worse due to recent allergy attacks causing increased coughing and sneezing. Pt has been perofmring exercise to her limit on a daily basis    Pertinent History anxiety, MS, fibromyalgia    Limitations Lifting;Standing;Walking;House hold activities    Diagnostic tests xray    Patient Stated Goals to be able to move around a little bit better, being able to bend over to change daughter's diaper. Being able to bend neck to her chest without severe pain    Currently in Pain? Yes    Pain Score 7     Pain Location Back    Pain Orientation Lower    Pain Descriptors / Indicators Aching;Sharp    Pain Type Chronic pain    Pain Onset More than a month ago              [facility] PT  Assessment - 05/16/20 0001      Assessment   Medical Diagnosis neck and low back pain    Referring Provider (PT) Rodell Perna    Next MD Visit as needed                         [facility] Adult PT Treatment/Exercise - 05/16/20 0001      Exercises   Exercises Lumbar      Lumbar Exercises: Standing   Row Strengthening;Both;20 reps;Theraband    Theraband Level (Row) Level 2 (Red)    Shoulder Extension Strengthening;Both;20 reps;Theraband  Theraband Level (Shoulder Extension) Level 2 (Red)    Shoulder Extension Limitations "stiff arm pull down"    Other Standing Lumbar Exercises BUE external rotation with red t-band 2x10      Lumbar Exercises: Seated   Other Seated Lumbar Exercises seated "good mornings" 2x10 with stool in front to limit forward flexion                  PT Education - 05/16/20 1335    Education Details education on HEP additions and rationale for trunk strengthening    Person(s) Educated Patient    Methods Explanation    Comprehension Verbalized understanding            PT Short Term Goals - 05/09/20 1355      PT SHORT TERM GOAL #1   Title Patient will be able to ambulate at least 2 minutes without increase in low back pain to improve overall function    Time 4    Period Weeks    Status New    Target Date 06/06/20      PT SHORT TERM GOAL #2   Title Patient will be able to demonstrate at least 45 degrees of cervical rotation in both directions to demonstrate improved neck mobilty    Time 4    Period Weeks    Status New    Target Date 06/06/20      PT SHORT TERM GOAL #3   Title Patient will be independent in self management strategies to improve quality of life and functional outcomes.    Time 4    Period Weeks    Status New    Target Date 06/06/20             PT Long Term Goals - 05/09/20 1405      PT LONG TERM GOAL #1   Title Patient will report at least 50% improvement in overall symptoms and/or function to demonstrate  improved functional mobility    Time 8    Period Weeks    Status New    Target Date 07/04/20      PT LONG TERM GOAL #2   Title Patient will be able to bend forward with good mechanics to change her daughter's diaper to reduce stress on her lumbar spine.    Time 8    Period Weeks    Status New    Target Date 07/04/20      PT LONG TERM GOAL #3   Title Patient will improve on back FOTO score to meet predicted outcomes to demonstrate improved functional mobility.    Time 8    Period Weeks    Status New    Target Date 07/04/20                 Plan - 05/16/20 1339    Clinical Impression Statement Patient presents with multiple complaints with chief complaint today of low back pain.  Difficulty with positions that require flexion to extension (seated good mornings) with pt reporting sensation of back slipping.  Tolerated more static, isometric trunk strengthening activities without pain but with frequent cues for direction and sequence of exercise/task.  Continued POC indicated to improve global strength and improve body mechanics    Personal Factors and Comorbidities Comorbidity 1;Comorbidity 2;Comorbidity 3+;Fitness    Comorbidities 4 abdominal surgeries (history, fibromyalgia, anxiety, MS,    Examination-Activity Limitations Bed Mobility;Bend;Caring for Others;Carry;Dressing;Lift;Locomotion Level;Reach Overhead;Transfers;Stand;Squat    Examination-Participation Restrictions Driving;Cleaning;Meal Prep    Stability/Clinical Decision Making  Evolving/Moderate complexity    Rehab Potential Good    PT Frequency 2x / week    PT Duration 6 weeks    PT Treatment/Interventions ADLs/Self Care Home Management;Aquatic Therapy;Biofeedback;Cryotherapy;Traction;Moist Heat;Gait training;Electrical Stimulation;Therapeutic exercise;Therapeutic activities;Manual techniques;Patient/family education;Neuromuscular re-education;Joint Manipulations    PT Next Visit Plan neck and back ROM, isometrics, LE  and core strengthening, cardio, STM for pain management as tolerated    PT Home Exercise Plan sit to stands, BUE shoulder extension, high row, ER with red t-band    Consulted and Agree with Plan of Care Patient           Patient will benefit from skilled therapeutic intervention in order to improve the following deficits and impairments:  Decreased range of motion,Decreased activity tolerance,Pain,Decreased endurance,Decreased strength,Difficulty walking,Decreased mobility,Decreased balance  Visit Diagnosis: Muscle weakness (generalized)  Cervicalgia  Chronic midline low back pain with left-sided sciatica     Problem List Patient Active Problem List   Diagnosis Date Noted  . Abnormal MRI, lumbar spine (03/06/2017) 04/13/2020  . Chronic low back pain (Bilateral) w/o sciatica 04/04/2020  . Conversion disorder with seizures or convulsions 12/06/2019  . Arthropathy of left knee 12/01/2019  . Osteoarthritis of knees (Bilateral) 11/02/2019  . Anxiety due to invasive procedure 11/02/2019  . Unstable knee, left 10/25/2019  . S/P small bowel resection 06/14/2019  . Infected hernioplasty mesh ([place]) 03/19/2019  . Pseudoseizures ([place]) 02/22/2019  . ADHD 02/22/2019  . Anxiety 02/22/2019  . Other chronic pain 01/08/2019  . Other cervical disc degeneration, unspecified cervical region 01/08/2019  . Personal history of nicotine dependence 01/08/2019  . S/P emergency C-section   . MS (multiple sclerosis) ([place])   . Chronic pain   . Panic attack   . Open wound of abdominal wall, anterior, complicated, sequela 60/63/0160  . Postoperative wound infection 09/20/2018  . History of substance use disorder 07/19/2018  . Chronic nausea 04/13/2018  . Nausea with vomiting 03/23/2018  . Pharmacologic therapy 03/16/2018  . Disorder of skeletal system 03/16/2018  . Problems influencing health status 03/16/2018  . Abnormal MRI, cervical spine (03/06/17) 03/16/2018  . Morbid obesity with BMI of  45.0-49.9, adult ([place]) 03/16/2018  . Chronic knee pain (Bilateral) 02/18/2018  . Cervical facet hypertrophy 04/29/2017  . Cervical facet syndrome (Bilateral) (R>L) 04/29/2017  . Spondylosis without myelopathy or radiculopathy, cervical region 04/29/2017  . Spondylosis without myelopathy or radiculopathy, lumbosacral region 03/04/2017  . Cervicogenic headache 02/10/2017  . Occipital headache 02/10/2017  . Trigger point with back pain (Left) 02/10/2017  . History of fainting (vasovagal) 02/10/2017  . History of vasovagal syncope 02/10/2017  . Neurogenic pain 01/23/2017  . Chronic musculoskeletal pain 12/02/2016  . Cervical radiculitis (Bilateral) 11/12/2016  . Chronic upper back pain (1ry area of Pain) (Bilateral) (R>L) 11/01/2016  . Chronic hand pain (3ry area of Pain) (Bilateral) (L>R) 11/01/2016  . Chronic wrist pain (3ry area of Pain) (Bilateral) (L>R) 11/01/2016  . Chronic foot/toes pain (4th area of Pain) (Bilateral) (R>L) 11/01/2016  . Chronic upper extremity pain (2ry area of Pain) (Bilateral) (L>R) 11/01/2016  . Long term prescription benzodiazepine use 11/01/2016  . 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 Cervical foraminal stenosis (Bilateral) (C6-7) 11/01/2016  . 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 DDD (degenerative disc disease), thoracic 11/01/2016  . DDD (degenerative disc disease), lumbar 11/01/2016  . Lumbar facet arthropathy 11/01/2016  . Lumbar facet syndrome (Bilateral) (R>L) 11/01/2016  . Vitamin D deficiency 10/28/2016  . Opiate use 07/23/2016  .  Chronic neck pain (1ry area of Pain) (Bilateral) (R>L) 07/23/2016  . Chronic low back pain (2ry area of Pain) (Bilateral) (L>R) w/ sciatica (Bilateral) 07/23/2016  . Chronic knee pain (5th area of Pain) (Left) 07/23/2016  . Midline thoracic back pain 07/23/2016  . Chronic lower extremity pain (Bilateral) 07/23/2016  . Incarcerated incisional hernia 02/16/2016  . Incisional hernia  02/15/2016  . Carpal tunnel syndrome (3ry area of Pain) (Bilateral) (L>R) 11/13/2015  . Substance abuse ([place]) 09/07/2015  . Paresthesia 08/02/2015  . Chronic pain syndrome 08/02/2015  . Postpartum endometritis 07/21/2015  . PTSD (post-traumatic stress disorder) 05/16/2015  . Severe episode of recurrent major depressive disorder, without psychotic features ([place]) 05/16/2015  . GAD (generalized anxiety disorder) 05/16/2015  . Difficult intravenous access 05/08/2015  . Right optic neuritis 04/27/2015  . Conversion disorder with attacks or seizures, persistent, with psychological stressor 03/28/2015  . Migraine with aura and without status migrainosus, not intractable 03/28/2015  . Generalized anxiety disorder 03/28/2015  . Conversion disorder 03/28/2015  . HTN (hypertension) 02/27/2015  . Perforated bowel ([place]) 02/27/2015  . Depression 12/27/2014  . Multiple sclerosis ([place]) 12/27/2014  . Migraine 12/27/2014  . Seizure disorder ([place]) 12/27/2014  . Smoker 12/27/2014  . Severe obesity (BMI >= 40) ([place]) 12/27/2014  . DDD (degenerative disc disease), cervical 07/28/2014  . Major depressive disorder, single episode 11/02/2013  . Gastroesophageal reflux disease 10/25/2010  . Tonsillitis 10/25/2010   1:45 PM, 05/16/20 M. Sherlyn Lees, PT, DPT Physical Therapist- [place] Office Number: [phone]  [facility] [address] [place], [place], [address] Phone: [phone]   Fax:  [phone]  Name: ILYSE WEILERT MRN: [id number] Date of Birth: 19-Apr-1980

## 2020-05-19 DIAGNOSIS — F902 Attention-deficit hyperactivity disorder, combined type: Secondary | ICD-10-CM | POA: Diagnosis not present

## 2020-05-19 DIAGNOSIS — F4312 Post-traumatic stress disorder, chronic: Secondary | ICD-10-CM | POA: Diagnosis not present

## 2020-05-19 DIAGNOSIS — F064 Anxiety disorder due to known physiological condition: Secondary | ICD-10-CM | POA: Diagnosis not present

## 2020-05-19 DIAGNOSIS — F3132 Bipolar disorder, current episode depressed, moderate: Secondary | ICD-10-CM | POA: Diagnosis not present

## 2020-05-23 ENCOUNTER — Other Ambulatory Visit: Payer: Self-pay

## 2020-05-23 ENCOUNTER — Ambulatory Visit (HOSPITAL_COMMUNITY): Payer: Medicare Other | Admitting: Physical Therapy

## 2020-05-23 DIAGNOSIS — M542 Cervicalgia: Secondary | ICD-10-CM

## 2020-05-23 DIAGNOSIS — G8929 Other chronic pain: Secondary | ICD-10-CM | POA: Diagnosis not present

## 2020-05-23 DIAGNOSIS — M5442 Lumbago with sciatica, left side: Secondary | ICD-10-CM | POA: Diagnosis not present

## 2020-05-23 DIAGNOSIS — M6281 Muscle weakness (generalized): Secondary | ICD-10-CM

## 2020-05-23 NOTE — Therapy (Signed)
[place], [place], [address] Phone: [phone]   Fax:  [phone]  Physical Therapy Treatment  Patient Details  Name: Emily [name] MRN: [id number] Date of Birth: 1980/05/26 Referring Provider (PT): Rodell Perna   Encounter Date: 05/23/2020   PT End of Session - 05/23/20 1357    Visit Number 3    Number of Visits 8    Date for PT Re-Evaluation 07/04/20    Authorization Type medicare primary and secondary medicaid    Progress Note Due on Visit 10    PT Start Time 1322    PT Stop Time 1402    PT Time Calculation (min) 40 min    Activity Tolerance Patient tolerated treatment well    Behavior During Therapy [facility] for tasks assessed/performed           Past Medical History:  Diagnosis Date  . ADHD   . Anxiety   . Asthma as a child  . Chronic back pain   . Chronic pain   . Conversion disorder   . DDD (degenerative disc disease), cervical   . Depression   . External hemorrhoid   . Fatty liver   . Fibromyalgia   . GERD (gastroesophageal reflux disease)   . History of kidney stones   . HTN (hypertension) 02/27/2015  . JC virus antibody positive   . Migraines   . MS (multiple sclerosis) ([place])    06-2006  . Neuromuscular disorder ([place])    MS  . Neuropathy   . OA (osteoarthritis)   . Ovarian cyst   . Panic attack   . Perforated bowel ([place]) 2009   colostomy bag for 3 motnhs  . Pneumonia   . Pseudoseizures ([place])    last seizure was 3 mo ago while taking modafinil  . PTSD (post-traumatic stress disorder)   . PTSD (post-traumatic stress disorder)   . S/P emergency C-section   . Seasonal allergies   . Urinary urgency   . Vertigo   . Wears contact lenses   . Wears glasses     Past Surgical History:  Procedure Laterality Date  . ABDOMINAL SURGERY    . ABDOMINAL WOUND EXPLORATION   03/19/2019   Abdominal Wound Exploration with Explantation of Mesh  . APPENDECTOMY    . BOWEL RESECTION  01/2007    with colostomy  . BOWEL RESECTION N/A 06/14/2019   Procedure: SMALL BOWEL RESECTION;  Surgeon: Ralene Ok, MD;  Location: [place];  Service: General;  Laterality: N/A;  . CESAREAN SECTION N/A 07/12/2015   Procedure: CESAREAN SECTION;  Surgeon: Guss Bunde, MD;  Location: [place];  Service: Obstetrics;  Laterality: N/A;  . COLON SURGERY     part of colon and intestines removed; colostomy 2008  . COLONOSCOPY WITH PROPOFOL N/A 01/29/2017   Procedure: COLONOSCOPY WITH PROPOFOL;  Surgeon: Ileana Roup, MD;  [facility]: [facility];  Service: General;  Laterality: N/A;  . COLOSTOMY CLOSURE  04/2007  . EVALUATION UNDER ANESTHESIA WITH HEMORRHOIDECTOMY N/A 06/11/2018   Procedure: ANORECTAL EXAM UNDER ANESTHESIA WITH HEMORRHOIDECTOMY;  Surgeon: Ileana Roup, MD;  Location: [place];  Service: General;  Laterality: N/A;  . EXCISIONAL HEMORRHOIDECTOMY    . EXTREMITY CYST EXCISION  1994   right leg  . HERNIA REPAIR    . INCISIONAL HERNIA REPAIR N/A 02/16/2016   Procedure: HERNIA REPAIR INCISIONAL;  Surgeon: Fanny Skates, MD;  Location: [facility];  Service: General;  Laterality: N/A;  .  INSERTION OF MESH  02/16/2016   Procedure: INSERTION OF MESH;  Surgeon: Fanny Skates, MD;  Location: [facility];  Service: General;;  . LAPAROSCOPY N/A 06/14/2019   Procedure: ATTEMPTED DIAGNOSTIC LAPAROSCOPY;  Surgeon: Ralene Ok, MD;  Location: [place];  Service: General;  Laterality: N/A;  . LAPAROTOMY  03/19/2019   Procedure: Abdominal Wound Exploration with Explantation of Mesh;  Surgeon: Ralene Ok, MD;  Location: [place];  Service: General;;  . LAPAROTOMY N/A 06/14/2019   Procedure: EXPLORATORY LAPAROTOMY;  Surgeon: Ralene Ok, MD;  Location: [place];  Service: General;  Laterality: N/A;  . LYSIS OF ADHESION N/A 06/14/2019   Procedure: LYSIS OF ADHESIONS;  Surgeon: Ralene Ok, MD;  Location: [place];  Service: General;  Laterality: N/A;  . RADIOLOGY WITH ANESTHESIA  N/A 03/06/2017   Procedure: MRI WITH ANESTHESIA OF CERVICAL SPINE WITHOUT CONTRAST, MRI OF LUMBAR SPINE WITHOUT CONTRAST;  Surgeon: Radiologist, Medication, MD;  Location: [place];  Service: Radiology;  Laterality: N/A;  . RADIOLOGY WITH ANESTHESIA N/A 09/15/2018   Procedure: MRI OF BRAIN WITH AND WITHOUT CONTRAST;  Surgeon: Radiologist, Medication, MD;  Location: [place];  Service: Radiology;  Laterality: N/A;  . RADIOLOGY WITH ANESTHESIA N/A 10/13/2018   Procedure: MRI OF BRAIN WITH AND WITHOUT CONTRAST;  Surgeon: Radiologist, Medication, MD;  Location: [place];  Service: Radiology;  Laterality: N/A;  . RADIOLOGY WITH ANESTHESIA N/A 10/26/2018   Procedure: MRI UNDER ANESTHESIA; HEAD, CERVICAL AND THORASCIC SPINE;  Surgeon: Radiologist, Medication, MD;  Location: [place];  Service: Radiology;  Laterality: N/A;  . SCAR REVISION  01/21/2011   Procedure: SCAR REVISION;  Surgeon: Hermelinda Dellen;  Location: [place];  Service: Plastics;  Laterality: N/A;  exploration of scar of abdomen and repair of defect  . TOOTH EXTRACTION Left 10/2016  . TRANSRECTAL DRAINAGE OF PELVIC ABSCESS      There were no vitals filed for this visit.   Subjective Assessment - 05/23/20 1329    Subjective Pt states her lower back is worse than her neck.  Currently 8/10 pain in lower back.  States she is going to get an MRI June 2 for neck and back.    Currently in Pain? Yes    Pain Score 8     Pain Location Back    Pain Orientation Lower    Pain Descriptors / Indicators Aching    Pain Type Chronic pain                             [facility] Adult PT Treatment/Exercise - 05/23/20 0001      Lumbar Exercises: Stretches   Other Lumbar Stretch Exercise hip excursions 10X each      Lumbar Exercises: Standing   Row Strengthening;Both;20 reps;Theraband    Theraband Level (Row) Level 2 (Red)    Shoulder Extension Strengthening;Both;20 reps;Theraband    Theraband Level (Shoulder Extension) Level 2  (Red)    Other Standing Lumbar Exercises BUE external rotation with red t-band 2x10      Lumbar Exercises: Supine   Ab Set 20 reps    AB Set Limitations 5"    Glut Set 20 reps    Glut Set Limitations 5"                  PT Education - 05/23/20 1401    Education Details see assessment; discussed POC and upcoming appts; educated in logroll technique    Person(s) Educated Patient  Methods Explanation;Demonstration    Comprehension Verbalized understanding;Returned demonstration;Verbal cues required;Tactile cues required;Need further instruction            PT Short Term Goals - 05/09/20 1355      PT SHORT TERM GOAL #1   Title Patient will be able to ambulate at least 2 minutes without increase in low back pain to improve overall function    Time 4    Period Weeks    Status New    Target Date 06/06/20      PT SHORT TERM GOAL #2   Title Patient will be able to demonstrate at least 45 degrees of cervical rotation in both directions to demonstrate improved neck mobilty    Time 4    Period Weeks    Status New    Target Date 06/06/20      PT SHORT TERM GOAL #3   Title Patient will be independent in self management strategies to improve quality of life and functional outcomes.    Time 4    Period Weeks    Status New    Target Date 06/06/20             PT Long Term Goals - 05/09/20 1405      PT LONG TERM GOAL #1   Title Patient will report at least 50% improvement in overall symptoms and/or function to demonstrate improved functional mobility    Time 8    Period Weeks    Status New    Target Date 07/04/20      PT LONG TERM GOAL #2   Title Patient will be able to bend forward with good mechanics to change her daughter's diaper to reduce stress on her lumbar spine.    Time 8    Period Weeks    Status New    Target Date 07/04/20      PT LONG TERM GOAL #3   Title Patient will improve on back FOTO score to meet predicted outcomes to demonstrate improved  functional mobility.    Time 8    Period Weeks    Status New    Target Date 07/04/20                 Plan - 05/23/20 1358    Clinical Impression Statement Continued with lumbar mobility and core stabilization exercises.  Pain verbalizes high pain however no pain behaviors during session.  Pt reported having upcoming chiropractor appt as well as MRI to be scheduled.  Suggested to either do PT or chiropractor as the two may conflict on each other as to what is helping/worsening symptoms.  Pt verbalized understanding.  Spent much of session discussing her symptoms and past procedures.  Trained with logroll technique for transitioning to/from sit/supine as patient noted to sit straight up.   Began isometric abdominal and glute contractions. Pt with difficulty completing abdominal due to past multiple surgeries.   Too painful to complete full bridge at this time.    Personal Factors and Comorbidities Comorbidity 1;Comorbidity 2;Comorbidity 3+;Fitness    Comorbidities 4 abdominal surgeries (history, fibromyalgia, anxiety, MS,    Examination-Activity Limitations Bed Mobility;Bend;Caring for Others;Carry;Dressing;Lift;Locomotion Level;Reach Overhead;Transfers;Stand;Squat    Examination-Participation Restrictions Driving;Cleaning;Meal Prep    Stability/Clinical Decision Making Evolving/Moderate complexity    Rehab Potential Good    PT Frequency 2x / week    PT Duration 6 weeks    PT Treatment/Interventions ADLs/Self Care Home Management;Aquatic Therapy;Biofeedback;Cryotherapy;Traction;Moist Heat;Gait training;Electrical Stimulation;Therapeutic exercise;Therapeutic activities;Manual techniques;Patient/family education;Neuromuscular re-education;Joint Manipulations  PT Next Visit Plan neck and back ROM, isometrics, LE and core strengthening, cardio, STM for pain management as tolerated    PT Home Exercise Plan sit to stands, BUE shoulder extension, high row, ER with red t-band    Consulted and  Agree with Plan of Care Patient           Patient will benefit from skilled therapeutic intervention in order to improve the following deficits and impairments:  Decreased range of motion,Decreased activity tolerance,Pain,Decreased endurance,Decreased strength,Difficulty walking,Decreased mobility,Decreased balance  Visit Diagnosis: Muscle weakness (generalized)  Cervicalgia  Chronic midline low back pain with left-sided sciatica     Problem List Patient Active Problem List   Diagnosis Date Noted  . Abnormal MRI, lumbar spine (03/06/2017) 04/13/2020  . Chronic low back pain (Bilateral) w/o sciatica 04/04/2020  . Conversion disorder with seizures or convulsions 12/06/2019  . Arthropathy of left knee 12/01/2019  . Osteoarthritis of knees (Bilateral) 11/02/2019  . Anxiety due to invasive procedure 11/02/2019  . Unstable knee, left 10/25/2019  . S/P small bowel resection 06/14/2019  . Infected hernioplasty mesh ([place]) 03/19/2019  . Pseudoseizures ([place]) 02/22/2019  . ADHD 02/22/2019  . Anxiety 02/22/2019  . Other chronic pain 01/08/2019  . Other cervical disc degeneration, unspecified cervical region 01/08/2019  . Personal history of nicotine dependence 01/08/2019  . S/P emergency C-section   . MS (multiple sclerosis) ([place])   . Chronic pain   . Panic attack   . Open wound of abdominal wall, anterior, complicated, sequela 71/06/2692  . Postoperative wound infection 09/20/2018  . History of substance use disorder 07/19/2018  . Chronic nausea 04/13/2018  . Nausea with vomiting 03/23/2018  . Pharmacologic therapy 03/16/2018  . Disorder of skeletal system 03/16/2018  . Problems influencing health status 03/16/2018  . Abnormal MRI, cervical spine (03/06/17) 03/16/2018  . Morbid obesity with BMI of 45.0-49.9, adult ([place]) 03/16/2018  . Chronic knee pain (Bilateral) 02/18/2018  . Cervical facet hypertrophy 04/29/2017  . Cervical facet syndrome (Bilateral) (R>L) 04/29/2017  .  Spondylosis without myelopathy or radiculopathy, cervical region 04/29/2017  . Spondylosis without myelopathy or radiculopathy, lumbosacral region 03/04/2017  . Cervicogenic headache 02/10/2017  . Occipital headache 02/10/2017  . Trigger point with back pain (Left) 02/10/2017  . History of fainting (vasovagal) 02/10/2017  . History of vasovagal syncope 02/10/2017  . Neurogenic pain 01/23/2017  . Chronic musculoskeletal pain 12/02/2016  . Cervical radiculitis (Bilateral) 11/12/2016  . Chronic upper back pain (1ry area of Pain) (Bilateral) (R>L) 11/01/2016  . Chronic hand pain (3ry area of Pain) (Bilateral) (L>R) 11/01/2016  . Chronic wrist pain (3ry area of Pain) (Bilateral) (L>R) 11/01/2016  . Chronic foot/toes pain (4th area of Pain) (Bilateral) (R>L) 11/01/2016  . Chronic upper extremity pain (2ry area of Pain) (Bilateral) (L>R) 11/01/2016  . Long term prescription benzodiazepine use 11/01/2016  . 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 Cervical foraminal stenosis (Bilateral) (C6-7) 11/01/2016  . 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 DDD (degenerative disc disease), thoracic 11/01/2016  . DDD (degenerative disc disease), lumbar 11/01/2016  . Lumbar facet arthropathy 11/01/2016  . Lumbar facet syndrome (Bilateral) (R>L) 11/01/2016  . Vitamin D deficiency 10/28/2016  . Opiate use 07/23/2016  . Chronic neck pain (1ry area of Pain) (Bilateral) (R>L) 07/23/2016  . Chronic low back pain (2ry area of Pain) (Bilateral) (L>R) w/ sciatica (Bilateral) 07/23/2016  . Chronic knee pain (5th area of Pain) (Left) 07/23/2016  . Midline thoracic back pain 07/23/2016  .  Chronic lower extremity pain (Bilateral) 07/23/2016  . Incarcerated incisional hernia 02/16/2016  . Incisional hernia 02/15/2016  . Carpal tunnel syndrome (3ry area of Pain) (Bilateral) (L>R) 11/13/2015  . Substance abuse ([place]) 09/07/2015  . Paresthesia 08/02/2015  . Chronic pain syndrome  08/02/2015  . Postpartum endometritis 07/21/2015  . PTSD (post-traumatic stress disorder) 05/16/2015  . Severe episode of recurrent major depressive disorder, without psychotic features ([place]) 05/16/2015  . GAD (generalized anxiety disorder) 05/16/2015  . Difficult intravenous access 05/08/2015  . Right optic neuritis 04/27/2015  . Conversion disorder with attacks or seizures, persistent, with psychological stressor 03/28/2015  . Migraine with aura and without status migrainosus, not intractable 03/28/2015  . Generalized anxiety disorder 03/28/2015  . Conversion disorder 03/28/2015  . HTN (hypertension) 02/27/2015  . Perforated bowel ([place]) 02/27/2015  . Depression 12/27/2014  . Multiple sclerosis ([place]) 12/27/2014  . Migraine 12/27/2014  . Seizure disorder ([place]) 12/27/2014  . Smoker 12/27/2014  . Severe obesity (BMI >= 40) ([place]) 12/27/2014  . DDD (degenerative disc disease), cervical 07/28/2014  . Major depressive disorder, single episode 11/02/2013  . Gastroesophageal reflux disease 10/25/2010  . Tonsillitis 10/25/2010   Teena Irani, PTA/CLT [phone]  Teena Irani 05/23/2020, 2:01 PM  [place], [place], [address] Phone: [phone]   Fax:  [phone]  Name: Emily [name] MRN: [id number] Date of Birth: 01-16-1980

## 2020-05-25 DIAGNOSIS — Z124 Encounter for screening for malignant neoplasm of cervix: Secondary | ICD-10-CM | POA: Diagnosis not present

## 2020-05-25 DIAGNOSIS — Z1151 Encounter for screening for human papillomavirus (HPV): Secondary | ICD-10-CM | POA: Diagnosis not present

## 2020-05-25 DIAGNOSIS — Z01419 Encounter for gynecological examination (general) (routine) without abnormal findings: Secondary | ICD-10-CM | POA: Diagnosis not present

## 2020-05-25 NOTE — Progress Notes (Signed)
No encounter on this date. "NO SHOW" or "Cancelled" appointment.  

## 2020-05-29 ENCOUNTER — Ambulatory Visit (HOSPITAL_COMMUNITY): Payer: Medicare Other

## 2020-05-30 ENCOUNTER — Telehealth: Payer: Self-pay | Admitting: *Deleted

## 2020-05-30 ENCOUNTER — Encounter: Payer: Self-pay | Admitting: Gastroenterology

## 2020-05-30 ENCOUNTER — Ambulatory Visit (HOSPITAL_COMMUNITY): Payer: Medicare Other | Admitting: Physical Therapy

## 2020-05-30 ENCOUNTER — Other Ambulatory Visit: Payer: Self-pay

## 2020-05-30 ENCOUNTER — Telehealth (INDEPENDENT_AMBULATORY_CARE_PROVIDER_SITE_OTHER): Payer: Medicare Other | Admitting: Gastroenterology

## 2020-05-30 DIAGNOSIS — M6281 Muscle weakness (generalized): Secondary | ICD-10-CM

## 2020-05-30 DIAGNOSIS — M542 Cervicalgia: Secondary | ICD-10-CM

## 2020-05-30 DIAGNOSIS — F064 Anxiety disorder due to known physiological condition: Secondary | ICD-10-CM | POA: Diagnosis not present

## 2020-05-30 DIAGNOSIS — R110 Nausea: Secondary | ICD-10-CM

## 2020-05-30 DIAGNOSIS — M5442 Lumbago with sciatica, left side: Secondary | ICD-10-CM | POA: Diagnosis not present

## 2020-05-30 DIAGNOSIS — F3132 Bipolar disorder, current episode depressed, moderate: Secondary | ICD-10-CM | POA: Diagnosis not present

## 2020-05-30 DIAGNOSIS — F902 Attention-deficit hyperactivity disorder, combined type: Secondary | ICD-10-CM | POA: Diagnosis not present

## 2020-05-30 DIAGNOSIS — G8929 Other chronic pain: Secondary | ICD-10-CM | POA: Diagnosis not present

## 2020-05-30 DIAGNOSIS — F4312 Post-traumatic stress disorder, chronic: Secondary | ICD-10-CM | POA: Diagnosis not present

## 2020-05-30 NOTE — Progress Notes (Signed)
Primary Care Physician:  Leeanne Rio, MD  Primary GI: Dr. Gala Romney   Patient Location: Home   Provider Location: [facility] office   Reason for Visit: Follow-up Nausea    Persons present on the virtual encounter, with roles: Patient and NP   Total time (minutes) spent on medical discussion: 10 minutes   Due to COVID-19, visit was conducted using virtual method.  Visit was requested by patient.  Virtual Visit via MyChart Video Note Due to COVID-19, visit is conducted virtually and was requested by patient.   I connected with Emily [name] on 05/30/20 at  9:00 AM EDT by video and verified that I am speaking with the correct person using two identifiers.   I discussed the limitations, risks, security and privacy concerns of performing an evaluation and management service by telephone and the availability of in person appointments. I also discussed with the patient that there may be a patient responsible charge related to this service. The patient expressed understanding and agreed to proceed.  Chief Complaint  Patient presents with  . Nausea    Better. Will be sedated for upcoming MRI, will need to wait for EGD     History of Present Illness: 40 year old female presenting today with a history of chronic nausea, present since MS diagnosis. Remote history of EGD at [facility] 15 years ago.Previously onZofran, Reglan, Compazine, Phenergan.Marinol has helped in the past. GES normal in the past. I had recommended EGD but she had to cancel due to health issues.  Has upcoming MRI and canceled EGD. She is actually doing better with nausea. Good appetite. No dysphagia. GERD controlled on omeprazole BID. Lower abdominal discomfort chronic. No rectal bleeding. History of multiple abdominal surgeries.     Past Medical History:  Diagnosis Date  . ADHD   . Anxiety   . Asthma as a child  . Chronic back pain   . Chronic pain   . Conversion disorder   . DDD (degenerative disc disease),  cervical   . Depression   . External hemorrhoid   . Fatty liver   . Fibromyalgia   . GERD (gastroesophageal reflux disease)   . History of kidney stones   . HTN (hypertension) 02/27/2015  . JC virus antibody positive   . Migraines   . MS (multiple sclerosis) ([place])    06-2006  . Neuromuscular disorder ([place])    MS  . Neuropathy   . OA (osteoarthritis)   . Ovarian cyst   . Panic attack   . Perforated bowel ([place]) 2009   colostomy bag for 3 motnhs  . Pneumonia   . Pseudoseizures ([place])    last seizure was 3 mo ago while taking modafinil  . PTSD (post-traumatic stress disorder)   . PTSD (post-traumatic stress disorder)   . S/P emergency C-section   . Seasonal allergies   . Urinary urgency   . Vertigo   . Wears contact lenses   . Wears glasses      Past Surgical History:  Procedure Laterality Date  . ABDOMINAL SURGERY    . ABDOMINAL WOUND EXPLORATION   03/19/2019   Abdominal Wound Exploration with Explantation of Mesh  . APPENDECTOMY    . BOWEL RESECTION  01/2007   with colostomy  . BOWEL RESECTION N/A 06/14/2019   Procedure: SMALL BOWEL RESECTION;  Surgeon: Ralene Ok, MD;  Location: [place];  Service: General;  Laterality: N/A;  . CESAREAN SECTION N/A 07/12/2015   Procedure: CESAREAN SECTION;  Surgeon: Fredderick Phenix  Gala Romney, MD;  Location: [place];  Service: Obstetrics;  Laterality: N/A;  . COLON SURGERY     part of colon and intestines removed; colostomy 2008  . COLONOSCOPY WITH PROPOFOL N/A 01/29/2017   Procedure: COLONOSCOPY WITH PROPOFOL;  Surgeon: Ileana Roup, MD;  Location: [facility];  Service: General;  Laterality: N/A;  . COLOSTOMY CLOSURE  04/2007  . EVALUATION UNDER ANESTHESIA WITH HEMORRHOIDECTOMY N/A 06/11/2018   Procedure: ANORECTAL EXAM UNDER ANESTHESIA WITH HEMORRHOIDECTOMY;  Surgeon: Ileana Roup, MD;  Location: [place];  Service: General;  Laterality: N/A;  . EXCISIONAL HEMORRHOIDECTOMY    . EXTREMITY CYST EXCISION   1994   right leg  . HERNIA REPAIR    . INCISIONAL HERNIA REPAIR N/A 02/16/2016   Procedure: HERNIA REPAIR INCISIONAL;  Surgeon: Fanny Skates, MD;  Location: [facility];  Service: General;  Laterality: N/A;  . INSERTION OF MESH  02/16/2016   Procedure: INSERTION OF MESH;  Surgeon: Fanny Skates, MD;  Location: [facility];  Service: General;;  . LAPAROSCOPY N/A 06/14/2019   Procedure: ATTEMPTED DIAGNOSTIC LAPAROSCOPY;  Surgeon: Ralene Ok, MD;  Location: [place];  Service: General;  Laterality: N/A;  . LAPAROTOMY  03/19/2019   Procedure: Abdominal Wound Exploration with Explantation of Mesh;  Surgeon: Ralene Ok, MD;  Location: [place];  Service: General;;  . LAPAROTOMY N/A 06/14/2019   Procedure: EXPLORATORY LAPAROTOMY;  Surgeon: Ralene Ok, MD;  Location: [place];  Service: General;  Laterality: N/A;  . LYSIS OF ADHESION N/A 06/14/2019   Procedure: LYSIS OF ADHESIONS;  Surgeon: Ralene Ok, MD;  Location: [place];  Service: General;  Laterality: N/A;  . RADIOLOGY WITH ANESTHESIA N/A 03/06/2017   Procedure: MRI WITH ANESTHESIA OF CERVICAL SPINE WITHOUT CONTRAST, MRI OF LUMBAR SPINE WITHOUT CONTRAST;  Surgeon: Radiologist, Medication, MD;  Location: [place];  Service: Radiology;  Laterality: N/A;  . RADIOLOGY WITH ANESTHESIA N/A 09/15/2018   Procedure: MRI OF BRAIN WITH AND WITHOUT CONTRAST;  Surgeon: Radiologist, Medication, MD;  Location: [place];  Service: Radiology;  Laterality: N/A;  . RADIOLOGY WITH ANESTHESIA N/A 10/13/2018   Procedure: MRI OF BRAIN WITH AND WITHOUT CONTRAST;  Surgeon: Radiologist, Medication, MD;  Location: [place];  Service: Radiology;  Laterality: N/A;  . RADIOLOGY WITH ANESTHESIA N/A 10/26/2018   Procedure: MRI UNDER ANESTHESIA; HEAD, CERVICAL AND THORASCIC SPINE;  Surgeon: Radiologist, Medication, MD;  Location: [place];  Service: Radiology;  Laterality: N/A;  . SCAR REVISION  01/21/2011   Procedure: SCAR REVISION;  Surgeon: Hermelinda Dellen;  Location: [place];  Service: Plastics;  Laterality: N/A;  exploration of scar of abdomen and repair of defect  . TOOTH EXTRACTION Left 10/2016  . TRANSRECTAL DRAINAGE OF PELVIC ABSCESS       Current Meds  Medication Sig  . albuterol (PROVENTIL HFA;VENTOLIN HFA) 108 (90 Base) MCG/ACT inhaler Inhale 2 puffs into the lungs every 6 (six) hours as needed for wheezing or shortness of breath. (Patient taking differently: Inhale 2 puffs into the lungs every 4 (four) hours as needed for wheezing or shortness of breath.)  . amLODipine (NORVASC) 5 MG tablet Take 5 mg by mouth daily.   Marland Kitchen amphetamine-dextroamphetamine (ADDERALL XR) 10 MG 24 hr capsule Take 10 mg by mouth daily.  Marland Kitchen aspirin-acetaminophen-caffeine (EXCEDRIN MIGRAINE) 250-250-65 MG tablet Take 1 tablet by mouth daily as needed for headache.  . cariprazine (VRAYLAR) capsule Take 3 mg by mouth at bedtime.  . divalproex (DEPAKOTE) 500 MG DR tablet  Take 1 tablet (500 mg total) by mouth at bedtime.  . dronabinol (MARINOL) 2.5 MG capsule TAKE 1 CAPSULE BY MOUTH 3 TIMES DAILY WITH MEALS. (Patient taking differently: Take 2.5 mg by mouth 3 (three) times daily with meals.)  . EPINEPHrine 0.3 mg/0.3 mL IJ SOAJ injection Inject 0.3 mg into the muscle as needed for anaphylaxis.   . famotidine (PEPCID) 40 MG tablet Take 1 tablet (40 mg total) by mouth at bedtime.  Marland Kitchen GILENYA 0.5 MG CAPS TAKE 1 CAPSULE BY MOUTH DAILY (Patient taking differently: Take 0.5 mg by mouth daily.)  . loratadine (CLARITIN) 10 MG tablet Take 10 mg by mouth daily as needed for allergies.  . medroxyPROGESTERone (DEPO-PROVERA) 150 MG/ML injection Inject 150 mg into the muscle every 3 (three) months.  . methocarbamol (ROBAXIN) 750 MG tablet Take 1 tablet (750 mg total) by mouth 2 (two) times daily as needed for muscle spasms.  . Naphazoline-Glycerin (CLEAR EYES REDNESS RELIEF OP) Place 1 drop into both eyes daily as needed (irrtiation).  Marland Kitchen omeprazole (PRILOSEC) 20 MG capsule TAKE 1 CAPSULE (20 MG  TOTAL) BY MOUTH 2 (TWO) TIMES DAILY BEFORE A MEAL.  . pregabalin (LYRICA) 225 MG capsule Take 1 capsule (225 mg total) by mouth 2 (two) times daily.  Marland Kitchen topiramate (TOPAMAX) 100 MG tablet Take 1.5 tablets twice a day (Patient taking differently: Take 150 mg by mouth 2 (two) times daily. Take 1.5 tablets twice a day)  . traZODone (DESYREL) 50 MG tablet Take 100 mg by mouth at bedtime.     Family History  Problem Relation Age of Onset  . Diabetes Mother   . Hypertension Mother   . Diabetes Father   . Hypertension Father   . Arthritis Father   . Cancer Maternal Grandmother   . Cancer Maternal Grandfather   . Alcohol abuse Neg Hx   . Anxiety disorder Neg Hx   . Bipolar disorder Neg Hx   . Drug abuse Neg Hx   . Depression Neg Hx   . Colon cancer Neg Hx     Social History   Socioeconomic History  . Marital status: Single    Spouse name: Not on file  . Number of children: 1  . Years of education: College  . Highest education level: Not on file  Occupational History    Employer: OTHER    Comment: disability  Tobacco Use  . Smoking status: Former Smoker    Packs/day: 0.25    Years: 10.00    Pack years: 2.50    Types: Cigarettes    Quit date: 07/12/2015    Years since quitting: 4.8  . Smokeless tobacco: Never Used  Vaping Use  . Vaping Use: Never used  Substance and Sexual Activity  . Alcohol use: No    Alcohol/week: 0.0 standard drinks  . Drug use: No    Comment: marinol that shows up at thc  . Sexual activity: Not Currently    Partners: Male    Birth control/protection: None, Implant  Other Topics Concern  . Not on file  Social History Narrative    Born and raised in [place], [place] by parents. Pt has one younger sister. Pt has a Best boy. Pt worked from 1999-2006. She stopped due to MS and is currently on disability.        Married for less than 1 yr in 1999 that ended in divorce.      Caffeine Use: 1 20oz soda daily  Lives with mom and  daughter   Social Determinants of Health   Financial Resource Strain: Not on file  Food Insecurity: Not on file  Transportation Needs: Not on file  Physical Activity: Not on file  Stress: Not on file  Social Connections: Not on file       Review of Systems: Gen: Denies fever, chills, anorexia. Denies fatigue, weakness, weight loss.  CV: Denies chest pain, palpitations, syncope, peripheral edema, and claudication. Resp: Denies dyspnea at rest, cough, wheezing, coughing up blood, and pleurisy. GI: see HPI Derm: Denies rash, itching, dry skin Psych: Denies depression, anxiety, memory loss, confusion. No homicidal or suicidal ideation.  Heme: Denies bruising, bleeding, and enlarged lymph nodes.  Observations/Objective: No distress. Unable to perform physical exam due to video encounter.   Assessment and Plan: 40 year old female presenting today with a history of chronic nausea, present since MS diagnosis. Remote history of EGD at [facility] 15 years ago.Previously onZofran, Reglan, Compazine, Phenergan.Marinol has helped in the past. GES normal in the past.   Nausea improved. Remains on PPI BID and Marinol, failing many other anti-emetics. Holding off on EGD due to numerous other health issues. As she is stable, we will see her back in 3-4 months. Call if any worsening of symptoms in the interim.    Follow Up Instructions:    I discussed the assessment and treatment plan with the patient. The patient was provided an opportunity to ask questions and all were answered. The patient agreed with the plan and demonstrated an understanding of the instructions.   The patient was advised to call back or seek an in-person evaluation if the symptoms worsen or if the condition fails to improve as anticipated.  I provided 10 minutes of face-to-face time during this MyChart Video encounter.  Annitta Needs, PhD, ANP-BC [facility]

## 2020-05-30 NOTE — Telephone Encounter (Signed)
Emily [name], you are scheduled for a virtual visit with your provider today.  Just as we do with appointments in the office, we must obtain your consent to participate.  Your consent will be active for this visit and any virtual visit you may have with one of our providers in the next 365 days.  If you have a MyChart account, I can also send a copy of this consent to you electronically.  All virtual visits are billed to your insurance company just like a traditional visit in the office.  As this is a virtual visit, video technology does not allow for your provider to perform a traditional examination.  This may limit your provider's ability to fully assess your condition.  If your provider identifies any concerns that need to be evaluated in person or the need to arrange testing such as labs, EKG, etc, we will make arrangements to do so.  Although advances in technology are sophisticated, we cannot ensure that it will always work on either your end or our end.  If the connection with a video visit is poor, we may have to switch to a telephone visit.  With either a video or telephone visit, we are not always able to ensure that we have a secure connection.   I need to obtain your verbal consent now.   Are you willing to proceed with your visit today?

## 2020-05-30 NOTE — Patient Instructions (Signed)
We will see you back in 3-4 months!  Please call if any worsening of symptoms, abdominal pain, vomiting.   I enjoyed seeing you again today! As you know, I value our relationship and want to provide genuine, compassionate, and quality care. I welcome your feedback. If you receive a survey regarding your visit,  I greatly appreciate you taking time to fill this out. See you next time!  Annitta Needs, PhD, ANP-BC [facility]

## 2020-05-30 NOTE — Telephone Encounter (Signed)
Pt consented to a virtual visit. 

## 2020-05-30 NOTE — Therapy (Signed)
[place], [place], [address] Phone: [phone]   Fax:  [phone]  Physical Therapy Treatment  Patient Details  Name: Emily [name] MRN: [id number] Date of Birth: 1980/12/24 Referring Provider (PT): Rodell Perna   Encounter Date: 05/30/2020   PT End of Session - 05/30/20 1429    Visit Number 4    Number of Visits 8    Date for PT Re-Evaluation 07/04/20    Authorization Type medicare primary and secondary medicaid    Progress Note Due on Visit 10    PT Start Time 1320    PT Stop Time 1400    PT Time Calculation (min) 40 min    Activity Tolerance Patient tolerated treatment well    Behavior During Therapy [facility] for tasks assessed/performed           Past Medical History:  Diagnosis Date  . ADHD   . Anxiety   . Asthma as a child  . Chronic back pain   . Chronic pain   . Conversion disorder   . DDD (degenerative disc disease), cervical   . Depression   . External hemorrhoid   . Fatty liver   . Fibromyalgia   . GERD (gastroesophageal reflux disease)   . History of kidney stones   . HTN (hypertension) 02/27/2015  . JC virus antibody positive   . Migraines   . MS (multiple sclerosis) ([place])    06-2006  . Neuromuscular disorder ([place])    MS  . Neuropathy   . OA (osteoarthritis)   . Ovarian cyst   . Panic attack   . Perforated bowel ([place]) 2009   colostomy bag for 3 motnhs  . Pneumonia   . Pseudoseizures ([place])    last seizure was 3 mo ago while taking modafinil  . PTSD (post-traumatic stress disorder)   . PTSD (post-traumatic stress disorder)   . S/P emergency C-section   . Seasonal allergies   . Urinary urgency   . Vertigo   . Wears contact lenses   . Wears glasses     Past Surgical History:  Procedure Laterality Date  . ABDOMINAL SURGERY    . ABDOMINAL WOUND EXPLORATION   03/19/2019   Abdominal Wound Exploration with Explantation of Mesh  . APPENDECTOMY    . BOWEL RESECTION  01/2007    with colostomy  . BOWEL RESECTION N/A 06/14/2019   Procedure: SMALL BOWEL RESECTION;  Surgeon: Ralene Ok, MD;  Location: [place];  Service: General;  Laterality: N/A;  . CESAREAN SECTION N/A 07/12/2015   Procedure: CESAREAN SECTION;  Surgeon: Guss Bunde, MD;  Location: [place];  Service: Obstetrics;  Laterality: N/A;  . COLON SURGERY     part of colon and intestines removed; colostomy 2008  . COLONOSCOPY WITH PROPOFOL N/A 01/29/2017   Procedure: COLONOSCOPY WITH PROPOFOL;  Surgeon: Ileana Roup, MD;  [facility];  Service: General;  Laterality: N/A;  . COLOSTOMY CLOSURE  04/2007  . EVALUATION UNDER ANESTHESIA WITH HEMORRHOIDECTOMY N/A 06/11/2018   Procedure: ANORECTAL EXAM UNDER ANESTHESIA WITH HEMORRHOIDECTOMY;  Surgeon: Ileana Roup, MD;  Location: [place];  Service: General;  Laterality: N/A;  . EXCISIONAL HEMORRHOIDECTOMY    . EXTREMITY CYST EXCISION  1994   right leg  . HERNIA REPAIR    . INCISIONAL HERNIA REPAIR N/A 02/16/2016   Procedure: HERNIA REPAIR INCISIONAL;  Surgeon: Fanny Skates, MD;  [facility]: [facility];  Service: General;  Laterality: N/A;  .  INSERTION OF MESH  02/16/2016   Procedure: INSERTION OF MESH;  Surgeon: Fanny Skates, MD;  Location: [facility];  Service: General;;  . LAPAROSCOPY N/A 06/14/2019   Procedure: ATTEMPTED DIAGNOSTIC LAPAROSCOPY;  Surgeon: Ralene Ok, MD;  Location: [place];  Service: General;  Laterality: N/A;  . LAPAROTOMY  03/19/2019   Procedure: Abdominal Wound Exploration with Explantation of Mesh;  Surgeon: Ralene Ok, MD;  Location: [place];  Service: General;;  . LAPAROTOMY N/A 06/14/2019   Procedure: EXPLORATORY LAPAROTOMY;  Surgeon: Ralene Ok, MD;  Location: [place];  Service: General;  Laterality: N/A;  . LYSIS OF ADHESION N/A 06/14/2019   Procedure: LYSIS OF ADHESIONS;  Surgeon: Ralene Ok, MD;  Location: [place];  Service: General;  Laterality: N/A;  . RADIOLOGY WITH ANESTHESIA  N/A 03/06/2017   Procedure: MRI WITH ANESTHESIA OF CERVICAL SPINE WITHOUT CONTRAST, MRI OF LUMBAR SPINE WITHOUT CONTRAST;  Surgeon: Radiologist, Medication, MD;  Location: [place];  Service: Radiology;  Laterality: N/A;  . RADIOLOGY WITH ANESTHESIA N/A 09/15/2018   Procedure: MRI OF BRAIN WITH AND WITHOUT CONTRAST;  Surgeon: Radiologist, Medication, MD;  Location: [place];  Service: Radiology;  Laterality: N/A;  . RADIOLOGY WITH ANESTHESIA N/A 10/13/2018   Procedure: MRI OF BRAIN WITH AND WITHOUT CONTRAST;  Surgeon: Radiologist, Medication, MD;  Location: [place];  Service: Radiology;  Laterality: N/A;  . RADIOLOGY WITH ANESTHESIA N/A 10/26/2018   Procedure: MRI UNDER ANESTHESIA; HEAD, CERVICAL AND THORASCIC SPINE;  Surgeon: Radiologist, Medication, MD;  Location: [place];  Service: Radiology;  Laterality: N/A;  . SCAR REVISION  01/21/2011   Procedure: SCAR REVISION;  Surgeon: Hermelinda Dellen;  Location: [place];  Service: Plastics;  Laterality: N/A;  exploration of scar of abdomen and repair of defect  . TOOTH EXTRACTION Left 10/2016  . TRANSRECTAL DRAINAGE OF PELVIC ABSCESS      There were no vitals filed for this visit.                      [place] Adult PT Treatment/Exercise - 05/30/20 0001      Lumbar Exercises: Stretches   Other Lumbar Stretch Exercise hip excursions 10X each      Lumbar Exercises: Standing   Row Strengthening;Both;20 reps;Theraband    Theraband Level (Row) Level 2 (Red)    Shoulder Extension Strengthening;Both;20 reps;Theraband    Theraband Level (Shoulder Extension) Level 2 (Red)    Other Standing Lumbar Exercises BUE external rotation with red t-band 2x10      Lumbar Exercises: Supine   Ab Set 20 reps    AB Set Limitations 5"    Glut Set 20 reps    Glut Set Limitations 5"    Bridge 10 reps    Straight Leg Raise 10 reps                    PT Short Term Goals - 05/09/20 1355      PT SHORT TERM GOAL #1   Title Patient  will be able to ambulate at least 2 minutes without increase in low back pain to improve overall function    Time 4    Period Weeks    Status New    Target Date 06/06/20      PT SHORT TERM GOAL #2   Title Patient will be able to demonstrate at least 45 degrees of cervical rotation in both directions to demonstrate improved neck mobilty    Time 4  Period Weeks    Status New    Target Date 06/06/20      PT SHORT TERM GOAL #3   Title Patient will be independent in self management strategies to improve quality of life and functional outcomes.    Time 4    Period Weeks    Status New    Target Date 06/06/20             PT Long Term Goals - 05/09/20 1405      PT LONG TERM GOAL #1   Title Patient will report at least 50% improvement in overall symptoms and/or function to demonstrate improved functional mobility    Time 8    Period Weeks    Status New    Target Date 07/04/20      PT LONG TERM GOAL #2   Title Patient will be able to bend forward with good mechanics to change her daughter's diaper to reduce stress on her lumbar spine.    Time 8    Period Weeks    Status New    Target Date 07/04/20      PT LONG TERM GOAL #3   Title Patient will improve on back FOTO score to meet predicted outcomes to demonstrate improved functional mobility.    Time 8    Period Weeks    Status New    Target Date 07/04/20                 Plan - 05/30/20 1419    Clinical Impression Statement Pt returns today with pain level still high at 8/10.  Pt without pain behaviors throughout session, cues for correct form while completing all exercises.  Slow holds with theraband exercises and keeping core stable.  Pt requires redirecting throughout session due to frequent conversation/getting off task.  Bridge continues to be most challenging task and also added SLR resulted in early fatigue.    Personal Factors and Comorbidities Comorbidity 1;Comorbidity 2;Comorbidity 3+;Fitness     Comorbidities 4 abdominal surgeries (history, fibromyalgia, anxiety, MS,    Examination-Activity Limitations Bed Mobility;Bend;Caring for Others;Carry;Dressing;Lift;Locomotion Level;Reach Overhead;Transfers;Stand;Squat    Examination-Participation Restrictions Driving;Cleaning;Meal Prep    Stability/Clinical Decision Making Evolving/Moderate complexity    Rehab Potential Good    PT Frequency 2x / week    PT Duration 6 weeks    PT Treatment/Interventions ADLs/Self Care Home Management;Aquatic Therapy;Biofeedback;Cryotherapy;Traction;Moist Heat;Gait training;Electrical Stimulation;Therapeutic exercise;Therapeutic activities;Manual techniques;Patient/family education;Neuromuscular re-education;Joint Manipulations    PT Next Visit Plan LE and core strengthening, cardio, STM for pain management as tolerated.  Next session begin clams for core and add nustep at [facility] for cardio (N/B time as waits on daughter)    PT Home Exercise Plan sit to stands, BUE shoulder extension, high row, ER with red t-band    Consulted and Agree with Plan of Care Patient           Patient will benefit from skilled therapeutic intervention in order to improve the following deficits and impairments:  Decreased range of motion,Decreased activity tolerance,Pain,Decreased endurance,Decreased strength,Difficulty walking,Decreased mobility,Decreased balance  Visit Diagnosis: Muscle weakness (generalized)  Cervicalgia  Chronic midline low back pain with left-sided sciatica     Problem List Patient Active Problem List   Diagnosis Date Noted  . Nausea without vomiting 05/30/2020  . Abnormal MRI, lumbar spine (03/06/2017) 04/13/2020  . Chronic low back pain (Bilateral) w/o sciatica 04/04/2020  . Conversion disorder with seizures or convulsions 12/06/2019  . Arthropathy of left knee 12/01/2019  . Osteoarthritis of knees (  Bilateral) 11/02/2019  . Anxiety due to invasive procedure 11/02/2019  . Unstable knee, left  10/25/2019  . S/P small bowel resection 06/14/2019  . Infected hernioplasty mesh ([place]) 03/19/2019  . Pseudoseizures ([place]) 02/22/2019  . ADHD 02/22/2019  . Anxiety 02/22/2019  . Other chronic pain 01/08/2019  . Other cervical disc degeneration, unspecified cervical region 01/08/2019  . Personal history of nicotine dependence 01/08/2019  . S/P emergency C-section   . MS (multiple sclerosis) ([place])   . Chronic pain   . Panic attack   . Open wound of abdominal wall, anterior, complicated, sequela 08/67/6195  . Postoperative wound infection 09/20/2018  . History of substance use disorder 07/19/2018  . Chronic nausea 04/13/2018  . Nausea with vomiting 03/23/2018  . Pharmacologic therapy 03/16/2018  . Disorder of skeletal system 03/16/2018  . Problems influencing health status 03/16/2018  . Abnormal MRI, cervical spine (03/06/17) 03/16/2018  . Morbid obesity with BMI of 45.0-49.9, adult ([place]) 03/16/2018  . Chronic knee pain (Bilateral) 02/18/2018  . Cervical facet hypertrophy 04/29/2017  . Cervical facet syndrome (Bilateral) (R>L) 04/29/2017  . Spondylosis without myelopathy or radiculopathy, cervical region 04/29/2017  . Spondylosis without myelopathy or radiculopathy, lumbosacral region 03/04/2017  . Cervicogenic headache 02/10/2017  . Occipital headache 02/10/2017  . Trigger point with back pain (Left) 02/10/2017  . History of fainting (vasovagal) 02/10/2017  . History of vasovagal syncope 02/10/2017  . Neurogenic pain 01/23/2017  . Chronic musculoskeletal pain 12/02/2016  . Cervical radiculitis (Bilateral) 11/12/2016  . Chronic upper back pain (1ry area of Pain) (Bilateral) (R>L) 11/01/2016  . Chronic hand pain (3ry area of Pain) (Bilateral) (L>R) 11/01/2016  . Chronic wrist pain (3ry area of Pain) (Bilateral) (L>R) 11/01/2016  . Chronic foot/toes pain (4th area of Pain) (Bilateral) (R>L) 11/01/2016  . Chronic upper extremity pain (2ry area of Pain) (Bilateral) (L>R) 11/01/2016   . Long term prescription benzodiazepine use 11/01/2016  . 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 Cervical foraminal stenosis (Bilateral) (C6-7) 11/01/2016  . 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 DDD (degenerative disc disease), thoracic 11/01/2016  . DDD (degenerative disc disease), lumbar 11/01/2016  . Lumbar facet arthropathy 11/01/2016  . Lumbar facet syndrome (Bilateral) (R>L) 11/01/2016  . Vitamin D deficiency 10/28/2016  . Opiate use 07/23/2016  . Chronic neck pain (1ry area of Pain) (Bilateral) (R>L) 07/23/2016  . Chronic low back pain (2ry area of Pain) (Bilateral) (L>R) w/ sciatica (Bilateral) 07/23/2016  . Chronic knee pain (5th area of Pain) (Left) 07/23/2016  . Midline thoracic back pain 07/23/2016  . Chronic lower extremity pain (Bilateral) 07/23/2016  . Incarcerated incisional hernia 02/16/2016  . Incisional hernia 02/15/2016  . Carpal tunnel syndrome (3ry area of Pain) (Bilateral) (L>R) 11/13/2015  . Substance abuse ([place]) 09/07/2015  . Paresthesia 08/02/2015  . Chronic pain syndrome 08/02/2015  . Postpartum endometritis 07/21/2015  . PTSD (post-traumatic stress disorder) 05/16/2015  . Severe episode of recurrent major depressive disorder, without psychotic features ([place]) 05/16/2015  . GAD (generalized anxiety disorder) 05/16/2015  . Difficult intravenous access 05/08/2015  . Right optic neuritis 04/27/2015  . Conversion disorder with attacks or seizures, persistent, with psychological stressor 03/28/2015  . Migraine with aura and without status migrainosus, not intractable 03/28/2015  . Generalized anxiety disorder 03/28/2015  . Conversion disorder 03/28/2015  . HTN (hypertension) 02/27/2015  . Perforated bowel ([place]) 02/27/2015  . Depression 12/27/2014  . Multiple sclerosis ([place]) 12/27/2014  . Migraine 12/27/2014  . Seizure disorder ([place]) 12/27/2014  .  Smoker 12/27/2014  . Severe obesity (BMI >= 40) ([place]) 12/27/2014  .  DDD (degenerative disc disease), cervical 07/28/2014  . Major depressive disorder, single episode 11/02/2013  . Gastroesophageal reflux disease 10/25/2010  . Tonsillitis 10/25/2010   Emily [name], PTA/CLT [phone]  Emily [name] 05/30/2020, 2:31 PM  [place], [place], [address] Phone: [phone]   Fax:  [phone]  Name: MABELL ESGUERRA MRN: [id number] Date of Birth: [birth year]

## 2020-06-01 ENCOUNTER — Ambulatory Visit (HOSPITAL_COMMUNITY): Payer: Medicare Other

## 2020-06-01 DIAGNOSIS — F064 Anxiety disorder due to known physiological condition: Secondary | ICD-10-CM | POA: Diagnosis not present

## 2020-06-01 DIAGNOSIS — F3132 Bipolar disorder, current episode depressed, moderate: Secondary | ICD-10-CM | POA: Diagnosis not present

## 2020-06-01 DIAGNOSIS — F4312 Post-traumatic stress disorder, chronic: Secondary | ICD-10-CM | POA: Diagnosis not present

## 2020-06-01 DIAGNOSIS — F902 Attention-deficit hyperactivity disorder, combined type: Secondary | ICD-10-CM | POA: Diagnosis not present

## 2020-06-02 ENCOUNTER — Other Ambulatory Visit: Payer: Self-pay

## 2020-06-02 ENCOUNTER — Telehealth (INDEPENDENT_AMBULATORY_CARE_PROVIDER_SITE_OTHER): Payer: Medicare Other | Admitting: Neurology

## 2020-06-02 ENCOUNTER — Encounter: Payer: Self-pay | Admitting: Neurology

## 2020-06-02 VITALS — Ht 64.0 in | Wt 250.0 lb

## 2020-06-02 DIAGNOSIS — F445 Conversion disorder with seizures or convulsions: Secondary | ICD-10-CM

## 2020-06-02 DIAGNOSIS — G43109 Migraine with aura, not intractable, without status migrainosus: Secondary | ICD-10-CM

## 2020-06-02 DIAGNOSIS — G35 Multiple sclerosis: Secondary | ICD-10-CM | POA: Diagnosis not present

## 2020-06-02 MED ORDER — GILENYA 0.5 MG PO CAPS
1.0000 | ORAL_CAPSULE | Freq: Every day | ORAL | 11 refills | Status: DC
Start: 2020-06-02 — End: 2020-07-11

## 2020-06-02 MED ORDER — DIVALPROEX SODIUM 500 MG PO DR TAB: 500.0000 mg | DELAYED_RELEASE_TABLET | Freq: Every day | ORAL | 3 refills | Status: DC

## 2020-06-02 MED ORDER — TOPIRAMATE 100 MG PO TABS: ORAL_TABLET | ORAL | 3 refills | Status: DC

## 2020-06-02 NOTE — H&P (View-Only) (Signed)
Virtual Visit via Video Note The purpose of this virtual visit is to provide medical care while limiting exposure to the novel coronavirus.    Consent was obtained for video visit:  Yes.   Answered questions that patient had about telehealth interaction:  Yes.   I discussed the limitations, risks, security and privacy concerns of performing an evaluation and management service by telemedicine. I also discussed with the patient that there may be a patient responsible charge related to this service. The patient expressed understanding and agreed to proceed.  Pt location: Home Physician Location: office Name of referring provider:  Leeanne Rio, MD I connected with [place] at patients initiation/request on 06/02/2020 at 12:00 PM EDT by video enabled telemedicine application and verified that I am speaking with the correct person using two identifiers. Pt MRN:  [id number] Pt DOB:  [birth year] Video Participants:  Special L Wagman   History of Present Illness:  The patient was seen as a virtual video visit on 06/02/2020. She was last seen in the neurology clinic 6 months ago for MS, migraines, psychogenic non-epileptic events. She presents today for evaluation prior to her MRI under sedation scheduled for 06/08/20. She reports that she has been doing pretty good recently. She has seen her eye doctor, notes reviewed, she reports right eye pressure and pain, it feels like her eyeball will pop out of the socket. 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 No GCL loss or disc edema on exam. She is on Gilenya once a day without side effects. She has paresthesias in her hands from carpal tunnel syndrome. She has back pain and neck pain and will be going to [facility] in June for evaluation. She reports her last stress seizure was over a year ago, she has gotten her driver's license back. She is doing pretty good on her  psychiatric medications, taking Vraylar, Trazodone, Adderall. She only gets 2 hours of sleep, waking up with a start. She has recently been prescribed Tenex by her psychiatrist to hopefully help with anxiety which is likely cause of sleep difficulties. She reports a daily dull headache, with bad migraines around twice a month. She is scheduled for Botox next month, she felt it helped in the past. CGRP inhibitors did not help her much. She is also on Depakote 500mg  qhs and Topiramate 150mg  BID for migraine prophylaxis.     History on Initial Assessment 03/28/2015: This is a pleasant 40 yo RH woman with a history of relapsing-remitting multiple sclerosis, headaches, anxiety with panic attacks, conversion disorder with seizures. She presented at [redacted] weeks pregnant to determine if diazepam for anxiety/seizures would be a medication she can use, understanding this is pregnancy category D versus risks of injuring herself from falls when she has the psychogenic seizures. Records from her previous neurologists at [place] were reviewed. She was diagnosed with MS in June 2008 while living in [place] state, she started having right thigh pain and right eye blurred vision. She was diagnosed with right optic neuritis and underwent MRI brain and lumbar puncture which confirmed a diagnosis of MS. 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 She has been on Gilenya since 2014 without any relapse. This was discontinued in December 2016 when she found out she was pregnant. She has a history of chronic pain  and migraines, and was taking Depakote and Topamax for headache prophylaxis, also stopped in December 2016. She reports having Botox for her migraines, but so far migraines are not as bad as they were.  She started having shaking episodes in April 2016. Initially Depakote dose  was increased to 500mg  BID. She was back in May 2016 with multiple episodes of syncope, palpitations, a sensation of her heart jumping out of her chest. She also reported lightheadedness, facial numbness, and numbness in her hands and feet. On her last visit in June 2016 at [facility], shaking episodes were discussed. Events always occur after a stressful event. She had a spell in [place] where she had a normal EEG and was diagnosed with a conversion disorder. She had seen Dr. Darleene Cleaver and reportedly told her he could not help her because "her case was too complex." She started seeing a psychiatrist in [place], however since she switched neurologists, she reports that he cannot see her anymore. She has not been able to establish care with [facility] and continues to have anxiety and panic attacks "all the time." She is diaphoretic in the office today and reports she always feels anxious. When she starts having one of her seizures, she would have significant anxiety, "my body can't deal and goes into a seizure," she hears her heartbeat in her ears. If it slowly builds up, she can get herself to a safe place, however other times it "just hits me," and she falls to the ground and has injured herself. She reports falling out one time in the bathroom during a doctor visit. She is currently on Fluoxetine and Buspirone. Clonazepam in the past did not help. She would usually go to the ER for these spells, which would quiet down once she gets Benadryl and Diazepam.  She delivered early at [redacted] weeks gestation last July 12, 2015. A week after delivery, she started having numbness on the top of both feet, pain in the balls of her feet, with numbness traveling up to her mid-back. She also has numbness in both forearms, and reports her palms feel the same as her soles, with constant stabbing burning pain. She had these symptoms pre-pregnancy but states the numbness only went up to her knees. During pregnancy, all her symptoms  were less bothersome. She has been following with Pain management in [facility] and would occasionally take Oxycodone prior to her pregnancy.  Diagnostic Data:  I personally reviewed MRI brain with and without contrast done with [facility] last 10/02/13 which did not show any acute changes, there was multiple T2/FLAIR lesions in the subcortical and deep white matter regions, no abnormal enhancement. Hippocampi appear asymmetric, smaller on the right without abnormal signal or enhancement.  MRI C-spine with and without contrast 10/18/13 showed hazy T2 hyperintensity within the cord from C2-3 to C4-5 without abnormal enhancement.  per [facility] - MRI brain done 03/18/14: "No change in numerous multifocal T2 hyperintense white matter lesions relative to prior MRI from 04/17/2013 most consistent with demyelinating lesions given history of multiple sclerosis. No evidence of active demyelination."  MRI cervical spine done 03/18/14: "Relative to prior MRI from 10/20/2009, no change in patchy foci of increased T2 signal in the dorsal cord at C2- C4 in keeping with demyelinating lesions in this patient with a clinical history of multiple sclerosis.  No evidence of enhancement to suggest active demyelination. Moderately advanced degenerative disease at C5-C6 and C6-C7 with interval development of left eccentric central, foraminal entry zone and foraminal disc extrusion  at C6-C7 exerting mass effect on the exiting C7 and descending C8 roots.  MRI brain and C-spine 07/30/2015 with and without contrast : There was no abnormal enhancement. There were no changes from previous MRI brain in 09/2013 with multiple bilateral subcortical greater than 10 periventricular T2 hyperintensities. There were no cord lesions seen up to T3-4. There was chronic degenerative disc disease and central canal stenosis at C5-6 and C6-7, no abnormal cord signal, unchanged from 06/2014.  MRI brain, cervical and thoracic spine with and without contrast  in October 2020 did not show any new lesions, no abnormal enhancement. Stable findings with multiple scatted foci of FLAIR signal in the periventricular, deep, and juxtacortical white matter, subtle patchy cord signal abnormality at C2-4. She has degenerative disc osteophyte at C6-7 with moderate spinal stenosis, mild mass effect on the left greater than right spinal cord, advanced degenerative disc osteophyte at C5-6 with mild spinal stenosis and mild mass effect on left hemicord. Thoracic spine showed question subtle patchy cord signal abnormality at level of T6-7, T8-9, T9-10, no active demyelination. Mild degenerative spondylosis with no stenosis.  EMG/NCV done on both UE and right LE showed bilateral carpal tunnel syndrome, mild to moderate in degree, no evidence of polyneuropathy or radiculopathy on the right side.   Current Outpatient Medications on File Prior to Visit  Medication Sig Dispense Refill  . albuterol (PROVENTIL HFA;VENTOLIN HFA) 108 (90 Base) MCG/ACT inhaler Inhale 2 puffs into the lungs every 6 (six) hours as needed for wheezing or shortness of breath. (Patient taking differently: Inhale 2 puffs into the lungs every 4 (four) hours as needed for wheezing or shortness of breath.) 1 Inhaler 0  . amLODipine (NORVASC) 5 MG tablet Take 5 mg by mouth daily.   2  . amphetamine-dextroamphetamine (ADDERALL XR) 10 MG 24 hr capsule Take 10 mg by mouth daily.    Marland Kitchen aspirin-acetaminophen-caffeine (EXCEDRIN MIGRAINE) 250-250-65 MG tablet Take 1 tablet by mouth daily as needed for headache.    . cariprazine (VRAYLAR) capsule Take 3 mg by mouth at bedtime.    . divalproex (DEPAKOTE) 500 MG DR tablet Take 1 tablet (500 mg total) by mouth at bedtime. 90 tablet 3  . dronabinol (MARINOL) 2.5 MG capsule TAKE 1 CAPSULE BY MOUTH 3 TIMES DAILY WITH MEALS. (Patient taking differently: Take 2.5 mg by mouth 3 (three) times daily with meals.) 90 capsule 1  . EPINEPHrine 0.3 mg/0.3 mL IJ SOAJ injection Inject 0.3  mg into the muscle as needed for anaphylaxis.     . famotidine (PEPCID) 40 MG tablet Take 1 tablet (40 mg total) by mouth at bedtime. 90 tablet 3  . GILENYA 0.5 MG CAPS TAKE 1 CAPSULE BY MOUTH DAILY (Patient taking differently: Take 0.5 mg by mouth daily.) 30 capsule 5  . loratadine (CLARITIN) 10 MG tablet Take 10 mg by mouth daily as needed for allergies.    . medroxyPROGESTERone (DEPO-PROVERA) 150 MG/ML injection Inject 150 mg into the muscle every 3 (three) months.    . methocarbamol (ROBAXIN) 750 MG tablet Take 1 tablet (750 mg total) by mouth 2 (two) times daily as needed for muscle spasms. 60 tablet 2  . Naphazoline-Glycerin (CLEAR EYES REDNESS RELIEF OP) Place 1 drop into both eyes daily as needed (irrtiation).    Marland Kitchen omeprazole (PRILOSEC) 20 MG capsule TAKE 1 CAPSULE (20 MG TOTAL) BY MOUTH 2 (TWO) TIMES DAILY BEFORE A MEAL. 180 capsule 1  . pregabalin (LYRICA) 225 MG capsule Take 1 capsule (225 mg total)  by mouth 2 (two) times daily. 60 capsule 2  . topiramate (TOPAMAX) 100 MG tablet Take 1.5 tablets twice a day (Patient taking differently: Take 150 mg by mouth 2 (two) times daily. Take 1.5 tablets twice a day) 270 tablet 3  . traZODone (DESYREL) 50 MG tablet Take 100 mg by mouth at bedtime.     Current Facility-Administered Medications on File Prior to Visit  Medication Dose Route Frequency Provider Last Rate Last Admin  . metoCLOPramide (REGLAN) injection 10 mg  10 mg Intravenous Once Milton Ferguson, MD         Observations/Objective:   Vitals:   06/02/20 1106  Weight: 250 lb (113.4 kg)  Height: 5\' 4"  (1.626 m)   GEN:  The patient appears stated age and is in NAD.  Neurological examination: Patient is awake, alert. No aphasia or dysarthria. Intact fluency and comprehension.Cranial nerves: Extraocular movements intact with no nystagmus. No facial asymmetry. Motor: moves all extremities symmetrically, at least anti-gravity x 4.    Assessment and Plan:   This is a 40 yo RH woman  with a history of relapsing-remitting multiple sclerosis, migraine with aura, anxiety with panic attacks, conversion disorder with psychogenic seizures. She had been scheduled several times for MRI under sedation but had to cancel due to transportation issues. On her last visit she was reporting more weakness in her hands and legs. Recent eye exam raised concern for right retrobulbar optic neuritis. Proceed with MRI as scheduled next week, she has significant claustrophobia and needs sedation for MRI brain, cervical, and thoracic spine. Continue Gilenya. Continue Depakote 500mg  qhs, Topiramate 150mg  BID for migraine prophylaxis, she is scheduled for Botox next month. Mood has been stable, she has not had any psychogenic seizures in over a year. Continue follow-up with Pain Management, Ortho, and Psychiatry. She has a follow-up scheduled in 2 months and knows to call for any changes.    Follow Up Instructions:   -I discussed the assessment and treatment plan with the patient. The patient was provided an opportunity to ask questions and all were answered. The patient agreed with the plan and demonstrated an understanding of the instructions.   The patient was advised to call back or seek an in-person evaluation if the symptoms worsen or if the condition fails to improve as anticipated.    Cameron Sprang, MD

## 2020-06-02 NOTE — Progress Notes (Signed)
Virtual Visit via Video Note The purpose of this virtual visit is to provide medical care while limiting exposure to the novel coronavirus.    Consent was obtained for video visit:  Yes.   Answered questions that patient had about telehealth interaction:  Yes.   I discussed the limitations, risks, security and privacy concerns of performing an evaluation and management service by telemedicine. I also discussed with the patient that there may be a patient responsible charge related to this service. The patient expressed understanding and agreed to proceed.  Pt location: Home Physician Location: office Name of referring provider:  Leeanne Rio, MD I connected with [place] at patients initiation/request on 06/02/2020 at 12:00 PM EDT by video enabled telemedicine application and verified that I am speaking with the correct person using two identifiers. Pt MRN:  [id number] Pt DOB:  07/26/1980 Video Participants:  Linday L Boeh   History of Present Illness:  The patient was seen as a virtual video visit on 06/02/2020. She was last seen in the neurology clinic 6 months ago for MS, migraines, psychogenic non-epileptic events. She presents today for evaluation prior to her MRI under sedation scheduled for 06/08/20. She reports that she has been doing pretty good recently. She has seen her eye doctor, notes reviewed, she reports right eye pressure and pain, it feels like her eyeball will pop out of the socket. 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 No GCL loss or disc edema on exam. She is on Gilenya once a day without side effects. She has paresthesias in her hands from carpal tunnel syndrome. She has back pain and neck pain and will be going to [facility] in June for evaluation. She reports her last stress seizure was over a year ago, she has gotten her driver's license back. She is doing pretty good on her  psychiatric medications, taking Vraylar, Trazodone, Adderall. She only gets 2 hours of sleep, waking up with a start. She has recently been prescribed Tenex by her psychiatrist to hopefully help with anxiety which is likely cause of sleep difficulties. She reports a daily dull headache, with bad migraines around twice a month. She is scheduled for Botox next month, she felt it helped in the past. CGRP inhibitors did not help her much. She is also on Depakote 500mg  qhs and Topiramate 150mg  BID for migraine prophylaxis.     History on Initial Assessment 03/28/2015: This is a pleasant 40 yo RH woman with a history of relapsing-remitting multiple sclerosis, headaches, anxiety with panic attacks, conversion disorder with seizures. She presented at [redacted] weeks pregnant to determine if diazepam for anxiety/seizures would be a medication she can use, understanding this is pregnancy category D versus risks of injuring herself from falls when she has the psychogenic seizures. Records from her previous neurologists at [facility] were reviewed. She was diagnosed with MS in June 2008 while living in [place] state, she started having right thigh pain and right eye blurred vision. She was diagnosed with right optic neuritis and underwent MRI brain and lumbar puncture which confirmed a diagnosis of MS. 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 She has been on Gilenya since 2014 without any relapse. This was discontinued in December 2016 when she found out she was pregnant. She has a history of chronic pain  and migraines, and was taking Depakote and Topamax for headache prophylaxis, also stopped in December 2016. She reports having Botox for her migraines, but so far migraines are not as bad as they were.  She started having shaking episodes in April 2016. Initially Depakote dose  was increased to 500mg  BID. She was back in May 2016 with multiple episodes of syncope, palpitations, a sensation of her heart jumping out of her chest. She also reported lightheadedness, facial numbness, and numbness in her hands and feet. On her last visit in June 2016 at [facility], shaking episodes were discussed. Events always occur after a stressful event. She had a spell in [place] where she had a normal EEG and was diagnosed with a conversion disorder. She had seen Dr. Darleene Cleaver and reportedly told her he could not help her because "her case was too complex." She started seeing a psychiatrist in [place], however since she switched neurologists, she reports that he cannot see her anymore. She has not been able to establish care with [facility] and continues to have anxiety and panic attacks "all the time." She is diaphoretic in the office today and reports she always feels anxious. When she starts having one of her seizures, she would have significant anxiety, "my body can't deal and goes into a seizure," she hears her heartbeat in her ears. If it slowly builds up, she can get herself to a safe place, however other times it "just hits me," and she falls to the ground and has injured herself. She reports falling out one time in the bathroom during a doctor visit. She is currently on Fluoxetine and Buspirone. Clonazepam in the past did not help. She would usually go to the ER for these spells, which would quiet down once she gets Benadryl and Diazepam.  She delivered early at [redacted] weeks gestation last July 12, 2015. A week after delivery, she started having numbness on the top of both feet, pain in the balls of her feet, with numbness traveling up to her mid-back. She also has numbness in both forearms, and reports her palms feel the same as her soles, with constant stabbing burning pain. She had these symptoms pre-pregnancy but states the numbness only went up to her knees. During pregnancy, all her symptoms  were less bothersome. She has been following with Pain management in [facility] and would occasionally take Oxycodone prior to her pregnancy.  Diagnostic Data:  I personally reviewed MRI brain with and without contrast done with [facility] last 10/02/13 which did not show any acute changes, there was multiple T2/FLAIR lesions in the subcortical and deep white matter regions, no abnormal enhancement. Hippocampi appear asymmetric, smaller on the right without abnormal signal or enhancement.  MRI C-spine with and without contrast 10/18/13 showed hazy T2 hyperintensity within the cord from C2-3 to C4-5 without abnormal enhancement.  per [facility] - MRI brain done 03/18/14: "No change in numerous multifocal T2 hyperintense white matter lesions relative to prior MRI from 04/17/2013 most consistent with demyelinating lesions given history of multiple sclerosis. No evidence of active demyelination."  MRI cervical spine done 03/18/14: "Relative to prior MRI from 10/20/2009, no change in patchy foci of increased T2 signal in the dorsal cord at C2- C4 in keeping with demyelinating lesions in this patient with a clinical history of multiple sclerosis.  No evidence of enhancement to suggest active demyelination. Moderately advanced degenerative disease at C5-C6 and C6-C7 with interval development of left eccentric central, foraminal entry zone and foraminal disc extrusion  at C6-C7 exerting mass effect on the exiting C7 and descending C8 roots.  MRI brain and C-spine 07/30/2015 with and without contrast : There was no abnormal enhancement. There were no changes from previous MRI brain in 09/2013 with multiple bilateral subcortical greater than 10 periventricular T2 hyperintensities. There were no cord lesions seen up to T3-4. There was chronic degenerative disc disease and central canal stenosis at C5-6 and C6-7, no abnormal cord signal, unchanged from 06/2014.  MRI brain, cervical and thoracic spine with and without contrast  in October 2020 did not show any new lesions, no abnormal enhancement. Stable findings with multiple scatted foci of FLAIR signal in the periventricular, deep, and juxtacortical white matter, subtle patchy cord signal abnormality at C2-4. She has degenerative disc osteophyte at C6-7 with moderate spinal stenosis, mild mass effect on the left greater than right spinal cord, advanced degenerative disc osteophyte at C5-6 with mild spinal stenosis and mild mass effect on left hemicord. Thoracic spine showed question subtle patchy cord signal abnormality at level of T6-7, T8-9, T9-10, no active demyelination. Mild degenerative spondylosis with no stenosis.  EMG/NCV done on both UE and right LE showed bilateral carpal tunnel syndrome, mild to moderate in degree, no evidence of polyneuropathy or radiculopathy on the right side.   Current Outpatient Medications on File Prior to Visit  Medication Sig Dispense Refill  . albuterol (PROVENTIL HFA;VENTOLIN HFA) 108 (90 Base) MCG/ACT inhaler Inhale 2 puffs into the lungs every 6 (six) hours as needed for wheezing or shortness of breath. (Patient taking differently: Inhale 2 puffs into the lungs every 4 (four) hours as needed for wheezing or shortness of breath.) 1 Inhaler 0  . amLODipine (NORVASC) 5 MG tablet Take 5 mg by mouth daily.   2  . amphetamine-dextroamphetamine (ADDERALL XR) 10 MG 24 hr capsule Take 10 mg by mouth daily.    Marland Kitchen aspirin-acetaminophen-caffeine (EXCEDRIN MIGRAINE) 250-250-65 MG tablet Take 1 tablet by mouth daily as needed for headache.    . cariprazine (VRAYLAR) capsule Take 3 mg by mouth at bedtime.    . divalproex (DEPAKOTE) 500 MG DR tablet Take 1 tablet (500 mg total) by mouth at bedtime. 90 tablet 3  . dronabinol (MARINOL) 2.5 MG capsule TAKE 1 CAPSULE BY MOUTH 3 TIMES DAILY WITH MEALS. (Patient taking differently: Take 2.5 mg by mouth 3 (three) times daily with meals.) 90 capsule 1  . EPINEPHrine 0.3 mg/0.3 mL IJ SOAJ injection Inject 0.3  mg into the muscle as needed for anaphylaxis.     . famotidine (PEPCID) 40 MG tablet Take 1 tablet (40 mg total) by mouth at bedtime. 90 tablet 3  . GILENYA 0.5 MG CAPS TAKE 1 CAPSULE BY MOUTH DAILY (Patient taking differently: Take 0.5 mg by mouth daily.) 30 capsule 5  . loratadine (CLARITIN) 10 MG tablet Take 10 mg by mouth daily as needed for allergies.    . medroxyPROGESTERone (DEPO-PROVERA) 150 MG/ML injection Inject 150 mg into the muscle every 3 (three) months.    . methocarbamol (ROBAXIN) 750 MG tablet Take 1 tablet (750 mg total) by mouth 2 (two) times daily as needed for muscle spasms. 60 tablet 2  . Naphazoline-Glycerin (CLEAR EYES REDNESS RELIEF OP) Place 1 drop into both eyes daily as needed (irrtiation).    Marland Kitchen omeprazole (PRILOSEC) 20 MG capsule TAKE 1 CAPSULE (20 MG TOTAL) BY MOUTH 2 (TWO) TIMES DAILY BEFORE A MEAL. 180 capsule 1  . pregabalin (LYRICA) 225 MG capsule Take 1 capsule (225 mg total)  by mouth 2 (two) times daily. 60 capsule 2  . topiramate (TOPAMAX) 100 MG tablet Take 1.5 tablets twice a day (Patient taking differently: Take 150 mg by mouth 2 (two) times daily. Take 1.5 tablets twice a day) 270 tablet 3  . traZODone (DESYREL) 50 MG tablet Take 100 mg by mouth at bedtime.     Current Facility-Administered Medications on File Prior to Visit  Medication Dose Route Frequency Provider Last Rate Last Admin  . metoCLOPramide (REGLAN) injection 10 mg  10 mg Intravenous Once Milton Ferguson, MD         Observations/Objective:   Vitals:   06/02/20 1106  Weight: 250 lb (113.4 kg)  Height: 5\' 4"  (1.626 m)   GEN:  The patient appears stated age and is in NAD.  Neurological examination: Patient is awake, alert. No aphasia or dysarthria. Intact fluency and comprehension.Cranial nerves: Extraocular movements intact with no nystagmus. No facial asymmetry. Motor: moves all extremities symmetrically, at least anti-gravity x 4.    Assessment and Plan:   This is a 40 yo RH woman  with a history of relapsing-remitting multiple sclerosis, migraine with aura, anxiety with panic attacks, conversion disorder with psychogenic seizures. She had been scheduled several times for MRI under sedation but had to cancel due to transportation issues. On her last visit she was reporting more weakness in her hands and legs. Recent eye exam raised concern for right retrobulbar optic neuritis. Proceed with MRI as scheduled next week, she has significant claustrophobia and needs sedation for MRI brain, cervical, and thoracic spine. Continue Gilenya. Continue Depakote 500mg  qhs, Topiramate 150mg  BID for migraine prophylaxis, she is scheduled for Botox next month. Mood has been stable, she has not had any psychogenic seizures in over a year. Continue follow-up with Pain Management, Ortho, and Psychiatry. She has a follow-up scheduled in 2 months and knows to call for any changes.    Follow Up Instructions:   -I discussed the assessment and treatment plan with the patient. The patient was provided an opportunity to ask questions and all were answered. The patient agreed with the plan and demonstrated an understanding of the instructions.   The patient was advised to call back or seek an in-person evaluation if the symptoms worsen or if the condition fails to improve as anticipated.    Cameron Sprang, MD

## 2020-06-06 ENCOUNTER — Other Ambulatory Visit (HOSPITAL_COMMUNITY): Payer: Medicare Other

## 2020-06-06 ENCOUNTER — Ambulatory Visit (HOSPITAL_COMMUNITY): Payer: Medicare Other | Admitting: Physical Therapy

## 2020-06-07 ENCOUNTER — Encounter (HOSPITAL_COMMUNITY): Payer: Self-pay | Admitting: *Deleted

## 2020-06-07 NOTE — Progress Notes (Signed)
DUE TO COVID-19 ONLY ONE VISITOR IS ALLOWED TO COME WITH YOU AND STAY IN THE WAITING ROOM ONLY DURING PRE OP AND PROCEDURE DAY OF SURGERY.   PCP - Dr Catalina Antigua Cardiologist - n/a Gertie Fey - Dr Manus Rudd  Neuro - Dr Ellouise Newer  Chest x-ray - n/a EKG - DOS 06/08/20 Stress Test - n/a ECHO - n/a Cardiac Cath - n/a  Sleep study - 01/13/18, Negative  Anesthesia review: Yes  STOP now taking any Aspirin (unless otherwise instructed by your surgeon), Aleve, Naproxen, Ibuprofen, Motrin, Advil, Goody's, BC's, all herbal medications, fish oil, and all vitamins.   Coronavirus Screening  Covid test n/a - Ambulatory Surgery Do you have any of the following symptoms:  Cough yes/no: No Fever (>100.88F)  yes/no: No Runny nose yes/no: No Sore throat yes/no: No Difficulty breathing/shortness of breath  yes/no: No  Have you traveled in the last 14 days and where? yes/no: No  Patient verbalized understanding of instructions that were given via phone.

## 2020-06-07 NOTE — Anesthesia Preprocedure Evaluation (Addendum)
Anesthesia Evaluation  Patient identified by MRN, date of birth, ID band Patient awake    Reviewed: Allergy & Precautions, NPO status , Patient's Chart, lab work & pertinent test results  Airway Mallampati: II  TM Distance: >3 FB Neck ROM: Full    Dental  (+) Teeth Intact, Dental Advisory Given, Missing   Pulmonary asthma (childhood) , former smoker,  Quit smoking 2017   Pulmonary exam normal breath sounds clear to auscultation       Cardiovascular hypertension (158/92 in preop, per pt normally lower), Pt. 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  MR Thoracic spine negative.  MR Cervical Spinal reveals demyelinating disease and spinal stenosis   Last MRI 10/2018 under GA/ETT  Reproductive/Obstetrics negative OB ROS                           Anesthesia Physical Anesthesia Plan  ASA: III  Anesthesia Plan: General   Post-op Pain Management:    Induction: Intravenous  PONV Risk Score and Plan: 3 and Ondansetron, Dexamethasone, Midazolam and Treatment may vary due to age or medical condition  Airway Management Planned: Oral ETT  Additional Equipment: None  Intra-op Plan:   Post-operative Plan: Extubation in OR  Informed Consent: I have reviewed the patients History and Physical,  chart, labs and discussed the procedure including the risks, benefits and alternatives for the proposed anesthesia with the patient or authorized representative who has indicated his/her understanding and acceptance.     Dental advisory given  Plan Discussed with: CRNA  Anesthesia Plan Comments:        Anesthesia Quick Evaluation

## 2020-06-08 ENCOUNTER — Encounter (HOSPITAL_COMMUNITY): Payer: Self-pay

## 2020-06-08 ENCOUNTER — Ambulatory Visit (HOSPITAL_COMMUNITY): Payer: Medicare Other

## 2020-06-08 ENCOUNTER — Other Ambulatory Visit: Payer: Self-pay

## 2020-06-08 ENCOUNTER — Ambulatory Visit (HOSPITAL_COMMUNITY): Admission: RE | Admit: 2020-06-08 | Payer: Medicare Other | Source: Ambulatory Visit

## 2020-06-08 ENCOUNTER — Encounter (HOSPITAL_COMMUNITY): Admission: RE | Disposition: A | Discharge status: Home / Self Care | Payer: Self-pay | Source: Home / Self Care

## 2020-06-08 ENCOUNTER — Ambulatory Visit (HOSPITAL_COMMUNITY)
Admission: RE | Admit: 2020-06-08 | Discharge: 2020-06-08 | Disposition: A | Discharge status: Home / Self Care | Payer: Medicare Other | Attending: Neurology | Admitting: Neurology

## 2020-06-08 ENCOUNTER — Ambulatory Visit (HOSPITAL_COMMUNITY)
Admission: RE | Admit: 2020-06-08 | Discharge: 2020-06-08 | Disposition: A | Discharge status: Home / Self Care | Payer: Medicare Other | Source: Ambulatory Visit | Attending: Neurology | Admitting: Neurology

## 2020-06-08 ENCOUNTER — Ambulatory Visit (HOSPITAL_COMMUNITY): Payer: Medicare Other | Admitting: Emergency Medicine

## 2020-06-08 DIAGNOSIS — G43109 Migraine with aura, not intractable, without status migrainosus: Secondary | ICD-10-CM | POA: Diagnosis not present

## 2020-06-08 DIAGNOSIS — F445 Conversion disorder with seizures or convulsions: Secondary | ICD-10-CM | POA: Insufficient documentation

## 2020-06-08 DIAGNOSIS — M47812 Spondylosis without myelopathy or radiculopathy, cervical region: Secondary | ICD-10-CM | POA: Insufficient documentation

## 2020-06-08 DIAGNOSIS — I10 Essential (primary) hypertension: Secondary | ICD-10-CM | POA: Diagnosis not present

## 2020-06-08 DIAGNOSIS — M50322 Other cervical disc degeneration at C5-C6 level: Secondary | ICD-10-CM | POA: Insufficient documentation

## 2020-06-08 DIAGNOSIS — G35 Multiple sclerosis: Secondary | ICD-10-CM

## 2020-06-08 DIAGNOSIS — M47814 Spondylosis without myelopathy or radiculopathy, thoracic region: Secondary | ICD-10-CM | POA: Diagnosis not present

## 2020-06-08 DIAGNOSIS — Z79899 Other long term (current) drug therapy: Secondary | ICD-10-CM | POA: Insufficient documentation

## 2020-06-08 DIAGNOSIS — J019 Acute sinusitis, unspecified: Secondary | ICD-10-CM | POA: Diagnosis not present

## 2020-06-08 DIAGNOSIS — G5603 Carpal tunnel syndrome, bilateral upper limbs: Secondary | ICD-10-CM

## 2020-06-08 DIAGNOSIS — M2578 Osteophyte, vertebrae: Secondary | ICD-10-CM | POA: Diagnosis not present

## 2020-06-08 DIAGNOSIS — M4802 Spinal stenosis, cervical region: Secondary | ICD-10-CM | POA: Insufficient documentation

## 2020-06-08 DIAGNOSIS — M50221 Other cervical disc displacement at C4-C5 level: Secondary | ICD-10-CM | POA: Diagnosis not present

## 2020-06-08 HISTORY — PX: RADIOLOGY WITH ANESTHESIA: SHX6223

## 2020-06-08 LAB — COMPREHENSIVE METABOLIC PANEL
ALT: 22 U/L (ref 0–44)
AST: 27 U/L (ref 15–41)
Albumin: 4 g/dL (ref 3.5–5.0)
Alkaline Phosphatase: 60 U/L (ref 38–126)
Anion gap: 12 (ref 5–15)
BUN: 8 mg/dL (ref 6–20)
CO2: 24 mmol/L (ref 22–32)
Calcium: 9.2 mg/dL (ref 8.9–10.3)
Chloride: 101 mmol/L (ref 98–111)
Creatinine, Ser: 0.72 mg/dL (ref 0.44–1.00)
GFR, Estimated: >60 mL/min (ref >60)
Glucose, Bld: 95 mg/dL (ref 70–99)
Potassium: 4.3 mmol/L (ref 3.5–5.1)
Sodium: 137 mmol/L (ref 135–145)
Total Bilirubin: 1.3 mg/dL — ABNORMAL HIGH (ref 0.3–1.2)
Total Protein: 7.3 g/dL (ref 6.5–8.1)

## 2020-06-08 LAB — POCT PREGNANCY, URINE: Preg Test, Ur: NEGATIVE

## 2020-06-08 SURGERY — MRI WITH ANESTHESIA
Anesthesia: General

## 2020-06-08 MED ORDER — SUGAMMADEX SODIUM 200 MG/2ML IV SOLN
INTRAVENOUS | Status: DC | PRN
  Administered 2020-06-08: 240 mg via INTRAVENOUS

## 2020-06-08 MED ORDER — LIDOCAINE 2% (20 MG/ML) 5 ML SYRINGE
INTRAMUSCULAR | Status: DC | PRN
  Administered 2020-06-08: 60 mg via INTRAVENOUS

## 2020-06-08 MED ORDER — ORAL CARE MOUTH RINSE: 15.0000 mL | Freq: Once | OROMUCOSAL | Status: AC

## 2020-06-08 MED ORDER — FENTANYL CITRATE (PF) 100 MCG/2ML IJ SOLN
INTRAMUSCULAR | Status: DC | PRN
  Administered 2020-06-08: 100 ug via INTRAVENOUS

## 2020-06-08 MED ORDER — MIDAZOLAM HCL 2 MG/2ML IJ SOLN
INTRAMUSCULAR | Status: DC | PRN
  Administered 2020-06-08: 2 mg via INTRAVENOUS

## 2020-06-08 MED ORDER — PHENYLEPHRINE 40 MCG/ML (10ML) SYRINGE FOR IV PUSH (FOR BLOOD PRESSURE SUPPORT)
PREFILLED_SYRINGE | INTRAVENOUS | Status: DC | PRN
  Administered 2020-06-08: 80 ug via INTRAVENOUS

## 2020-06-08 MED ORDER — LACTATED RINGERS IV SOLN: INTRAVENOUS | Status: DC

## 2020-06-08 MED ORDER — CHLORHEXIDINE GLUCONATE 0.12 % MT SOLN
15.0000 mL | Freq: Once | OROMUCOSAL | Status: AC
  Administered 2020-06-08: 15 mL via OROMUCOSAL
  Filled 2020-06-08: qty 15

## 2020-06-08 MED ORDER — PROPOFOL 10 MG/ML IV BOLUS
INTRAVENOUS | Status: DC | PRN
  Administered 2020-06-08: 200 mg via INTRAVENOUS

## 2020-06-08 MED ORDER — ONDANSETRON HCL 4 MG/2ML IJ SOLN: 4.0000 mg | Freq: Once | INTRAMUSCULAR | Status: DC | PRN

## 2020-06-08 MED ORDER — ROCURONIUM BROMIDE 10 MG/ML (PF) SYRINGE
PREFILLED_SYRINGE | INTRAVENOUS | Status: DC | PRN
  Administered 2020-06-08: 100 mg via INTRAVENOUS

## 2020-06-08 MED ORDER — DEXAMETHASONE SODIUM PHOSPHATE 10 MG/ML IJ SOLN
INTRAMUSCULAR | Status: DC | PRN
  Administered 2020-06-08: 10 mg via INTRAVENOUS

## 2020-06-08 MED ORDER — ONDANSETRON HCL 4 MG/2ML IJ SOLN
INTRAMUSCULAR | Status: DC | PRN
  Administered 2020-06-08: 4 mg via INTRAVENOUS

## 2020-06-08 MED ORDER — GADOBUTROL 1 MMOL/ML IV SOLN
10.0000 mL | Freq: Once | INTRAVENOUS | Status: AC | PRN
  Administered 2020-06-08: 10 mL via INTRAVENOUS

## 2020-06-08 NOTE — Anesthesia Procedure Notes (Signed)
Procedure Name: Intubation Date/Time: 06/08/2020 8:56 AM Performed by: Leonor Liv, CRNA Pre-anesthesia Checklist: Patient identified, Emergency Drugs available, Suction available and Patient being monitored Patient Re-evaluated:Patient Re-evaluated prior to induction Oxygen Delivery Method: Circle System Utilized Preoxygenation: Pre-oxygenation with 100% oxygen Induction Type: IV induction Ventilation: Mask ventilation without difficulty Laryngoscope Size: Glidescope and 3 Grade View: Grade I Tube type: Oral Tube size: 7.0 mm Number of attempts: 1 Airway Equipment and Method: Stylet and Oral airway Placement Confirmation: ETT inserted through vocal cords under direct vision,  positive ETCO2 and breath sounds checked- equal and bilateral Secured at: 20 cm Tube secured with: Tape Dental Injury: Teeth and Oropharynx as per pre-operative assessment

## 2020-06-08 NOTE — Transfer of Care (Signed)
Immediate Anesthesia Transfer of Care Note  Patient: Emily [name]  Procedure(s) Performed: MRI WITH ANESTHESIA BRAIN WITH AND WITHOUT CONTRAST.,  CERVICAL BRAIN WITH AND WITHOUT CONTRAST, THORACIC SPINE WITH AND WITHOUT (N/A )  Patient Location: PACU  Anesthesia Type:General  Level of Consciousness: awake, alert  and oriented  Airway & Oxygen Therapy: Patient Spontanous Breathing and Patient connected to nasal cannula oxygen  Post-op Assessment: Report given to RN, Post -op Vital signs reviewed and stable and Patient moving all extremities  Post vital signs: Reviewed and stable  Last Vitals:  Vitals Value Taken Time  BP 141/63 06/08/20 1046  Temp 36.5 C 06/08/20 1045  Pulse 95 06/08/20 1054  Resp 15 06/08/20 1054  SpO2 99 % 06/08/20 1054  Vitals shown include unvalidated device data.  Last Pain:  Vitals:   06/08/20 0617  TempSrc: Oral  PainSc:       Patients Stated Pain Goal: 3 (81/01/75 1025)  Complications: No complications documented.

## 2020-06-08 NOTE — Anesthesia Postprocedure Evaluation (Signed)
Anesthesia Post Note  Patient: Emily [name]  Procedure(s) Performed: MRI WITH ANESTHESIA BRAIN WITH AND WITHOUT CONTRAST.,  CERVICAL BRAIN WITH AND WITHOUT CONTRAST, THORACIC SPINE WITH AND WITHOUT (N/A )     Patient location during evaluation: PACU Anesthesia Type: General Level of consciousness: awake and alert, oriented and patient cooperative Pain management: pain level controlled Vital Signs Assessment: post-procedure vital signs reviewed and stable Respiratory status: spontaneous breathing, nonlabored ventilation and respiratory function stable Cardiovascular status: blood pressure returned to baseline and stable Postop Assessment: no apparent nausea or vomiting Anesthetic complications: no   No complications documented.  Last Vitals:  Vitals:   06/08/20 1045 06/08/20 1100  BP: (!) 141/63 125/86  Pulse: 97 92  Resp: 18 14  Temp: 36.5 C 36.5 C  SpO2: 94% 98%    Last Pain:  Vitals:   06/08/20 0617  TempSrc: Oral  PainSc:                  Pervis Hocking

## 2020-06-09 ENCOUNTER — Encounter (HOSPITAL_COMMUNITY): Payer: Self-pay | Admitting: Radiology

## 2020-06-12 DIAGNOSIS — F4312 Post-traumatic stress disorder, chronic: Secondary | ICD-10-CM | POA: Diagnosis not present

## 2020-06-12 DIAGNOSIS — F3132 Bipolar disorder, current episode depressed, moderate: Secondary | ICD-10-CM | POA: Diagnosis not present

## 2020-06-12 DIAGNOSIS — F902 Attention-deficit hyperactivity disorder, combined type: Secondary | ICD-10-CM | POA: Diagnosis not present

## 2020-06-12 DIAGNOSIS — F064 Anxiety disorder due to known physiological condition: Secondary | ICD-10-CM | POA: Diagnosis not present

## 2020-06-13 ENCOUNTER — Encounter (HOSPITAL_COMMUNITY): Payer: Medicare Other | Admitting: Physical Therapy

## 2020-06-13 DIAGNOSIS — M4802 Spinal stenosis, cervical region: Secondary | ICD-10-CM | POA: Diagnosis not present

## 2020-06-15 NOTE — Addendum Note (Signed)
Addendum  created 06/15/20 2001 by Pervis Hocking, DO   Clinical Note Signed

## 2020-06-15 NOTE — Interval H&P Note (Signed)
Anesthesia H&P Update: History and Physical Exam reviewed; patient is OK for planned anesthetic and procedure. ? ?

## 2020-06-16 ENCOUNTER — Encounter: Payer: Self-pay | Admitting: Neurology

## 2020-06-16 ENCOUNTER — Other Ambulatory Visit: Payer: Self-pay

## 2020-06-16 ENCOUNTER — Telehealth (INDEPENDENT_AMBULATORY_CARE_PROVIDER_SITE_OTHER): Payer: Medicare Other | Admitting: Neurology

## 2020-06-16 VITALS — Ht 63.0 in | Wt 250.0 lb

## 2020-06-16 DIAGNOSIS — G35 Multiple sclerosis: Secondary | ICD-10-CM

## 2020-06-16 DIAGNOSIS — G43109 Migraine with aura, not intractable, without status migrainosus: Secondary | ICD-10-CM | POA: Diagnosis not present

## 2020-06-16 DIAGNOSIS — F445 Conversion disorder with seizures or convulsions: Secondary | ICD-10-CM

## 2020-06-16 DIAGNOSIS — G894 Chronic pain syndrome: Secondary | ICD-10-CM | POA: Diagnosis not present

## 2020-06-16 NOTE — Progress Notes (Signed)
Virtual Visit via Video Note The purpose of this virtual visit is to provide medical care while limiting exposure to the novel coronavirus.    Consent was obtained for video visit:  Yes.   Answered questions that patient had about telehealth interaction:  Yes.   I discussed the limitations, risks, security and privacy concerns of performing an evaluation and management service by telemedicine. I also discussed with the patient that there may be a patient responsible charge related to this service. The patient expressed understanding and agreed to proceed.  Pt location: Home Physician Location: office Name of referring provider:  Leeanne Rio, MD I connected with Emily [name] at patients initiation/request on 06/16/2020 at  8:30 AM EDT by video enabled telemedicine application and verified that I am speaking with the correct person using two identifiers. Pt MRN:  [id number] Pt DOB:  Mar 01, 1980 Video Participants:  Emily [name]   History of Present Illness:  The patient was seen as a virtual video visit on 06/16/2020. She was last seen 2 weeks ago and presents to discuss MRI results. She had MRI brain, cervical, thoracic spine with and without contrast 06/08/20 which I personally reviewed. There were no enhancing lesions seen. Compared to 2020 imaging, there was a new lesion within the right aspect of the spinal cord at T4 and small focus of signal abnormality within the central cord at T9. Similar patchy signal changes were seen at T6,T7, while T10 and 11 appears slightly progressed. Signal changes at C2-3, C4 were unchanged, brain MRI showed similar signal changes perpendicular to the lateral ventricles, callososeptal interface with no changes from 2020. Degenerative changes were seen at C5-6 and C6-7 with moderate disc degeneration, posterior osteophyte complex contributes to multifactorial moderate spinal stenosis with mild spinal cord flatterning and mild left neural foraminal  narrowing at C5-6, moderate right and moderate/severe left neural foraminal narrowing at C6-7.   She continues to report paresthesias in her extremities. Her left arm goes numb to pins and needles. She had a blood clot where she had IV put in her wrist. Her fingertips go numb in the right hand, arm is fine but still has a burning "like acid." The bottom of her feet are numb, heels are worse. Her face has gotten numb in the cheeks and around her chin. When she bends her head down, there is pain behind her ears and neck, her head and shoulders get numb and cold with burning in the area. She has seen [facility] for her back and states "surgically they cannot do anything" and are referring her to another place. She states "nobody would help me with my back." She is doing physical therapy and wants to see a chiropractor as well. She denies missing any doses of Gilenya. She reports that she was previously on Copaxone, then was on Tysabri for 4 years until she became JC positive and was started on Gilenya. She is looking forward to starting Botox for migraine prophylaxis, she reports a daily migraine. She did not feel CGRP inhibitors helped much. She is taking Depakote 500mg  qhs and Topiramate 150mg  BID for migraine prophylaxis. She has been doing well with mood and has not had any psychogenic seizures in a while.    History on Initial Assessment 03/28/2015: This is a pleasant 40 yo RH woman with a history of relapsing-remitting multiple sclerosis, headaches, anxiety with panic attacks, conversion disorder with seizures. She presented at [redacted] weeks pregnant to determine if diazepam for anxiety/seizures would be a  medication she can use, understanding this is pregnancy category D versus risks of injuring herself from falls when she has the psychogenic seizures. Records from her previous neurologists at [facility] were reviewed. She was diagnosed with MS in June 2008 while living in [place] state, she started having  right thigh pain and right eye blurred vision. She was diagnosed with right optic neuritis and underwent MRI brain and lumbar puncture which confirmed a diagnosis of MS. 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 She has been on Gilenya since 2014 without any relapse. This was discontinued in December 2016 when she found out she was pregnant. She has a history of chronic pain and migraines, and was taking Depakote and Topamax for headache prophylaxis, also stopped in December 2016. She reports having Botox for her migraines, but so far migraines are not as bad as they were.  She started having shaking episodes in April 2016. Initially Depakote dose was increased to 500mg  BID. She was back in May 2016 with multiple episodes of syncope, palpitations, a sensation of her heart jumping out of her chest. She also reported lightheadedness, facial numbness, and numbness in her hands and feet. On her last visit in June 2016 at [facility], shaking episodes were discussed. Events always occur after a stressful event. She had a spell in [place] where she had a normal EEG and was diagnosed with a conversion disorder. She had seen Dr. Darleene Cleaver and reportedly told her he could not help her because "her case was too complex." She started seeing a psychiatrist in [place], however since she switched neurologists, she reports that he cannot see her anymore. She has not been able to establish care with [facility] and continues to have anxiety and panic attacks "all the time." She is diaphoretic in the office today and reports she always feels anxious. When she starts having one of her seizures, she would have significant anxiety, "my body can't deal and goes into a seizure," she hears her heartbeat in her ears. If it slowly builds up, she can get herself to a safe place, however other times  it "just hits me," and she falls to the ground and has injured herself. She reports falling out one time in the bathroom during a doctor visit. She is currently on Fluoxetine and Buspirone. Clonazepam in the past did not help. She would usually go to the ER for these spells, which would quiet down once she gets Benadryl and Diazepam.  She delivered early at [redacted] weeks gestation last July 12, 2015. A week after delivery, she started having numbness on the top of both feet, pain in the balls of her feet, with numbness traveling up to her mid-back. She also has numbness in both forearms, and reports her palms feel the same as her soles, with constant stabbing burning pain. She had these symptoms pre-pregnancy but states the numbness only went up to her knees. During pregnancy, all her symptoms were less bothersome. She has been following with Pain management in [facility] and would occasionally take Oxycodone prior to her pregnancy.  Diagnostic Data:   I personally reviewed MRI brain with and without contrast done with [facility] last 10/02/13 which did not show any acute changes, there was multiple T2/FLAIR lesions in the subcortical and deep white matter regions, no abnormal enhancement. Hippocampi appear asymmetric, smaller on the right without abnormal signal  or enhancement.   MRI C-spine with and without contrast 10/18/13 showed hazy T2 hyperintensity within the cord from C2-3 to C4-5 without abnormal enhancement.  per [facility] - MRI brain done 03/18/14: "No change in numerous multifocal T2 hyperintense white matter lesions relative to prior MRI from 04/17/2013 most consistent with demyelinating lesions given history of multiple sclerosis. No evidence of active demyelination."  MRI cervical spine done 03/18/14: "Relative to prior MRI from 10/20/2009, no change in patchy foci of increased T2 signal in the dorsal cord at C2- C4 in keeping with demyelinating lesions in this patient with a clinical history of multiple  sclerosis.   No evidence of enhancement to suggest active demyelination. Moderately advanced degenerative disease at C5-C6 and C6-C7 with interval development of left eccentric central, foraminal entry zone and foraminal disc extrusion at C6-C7 exerting mass effect on the exiting C7 and descending C8 roots.  MRI brain and C-spine 07/30/2015 with and without contrast : There was no abnormal enhancement. There were no changes from previous MRI brain in 09/2013 with multiple bilateral subcortical greater than 10 periventricular T2 hyperintensities. There were no cord lesions seen up to T3-4. There was chronic degenerative disc disease and central canal stenosis at C5-6 and C6-7, no abnormal cord signal, unchanged from 06/2014.  MRI brain, cervical and thoracic spine with and without contrast in October 2020 did not show any new lesions, no abnormal enhancement. Stable findings with multiple scatted foci of FLAIR signal in the periventricular, deep, and juxtacortical white matter, subtle patchy cord signal abnormality at C2-4. She has degenerative disc osteophyte at C6-7 with moderate spinal stenosis, mild mass effect on the left greater than right spinal cord, advanced degenerative disc osteophyte at C5-6 with mild spinal stenosis and mild mass effect on left hemicord. Thoracic spine showed question subtle patchy cord signal abnormality at level of T6-7, T8-9, T9-10, no active demyelination. Mild degenerative spondylosis with no stenosis.  EMG/NCV done on both UE and right LE showed bilateral carpal tunnel syndrome, mild to moderate in degree, no evidence of polyneuropathy or radiculopathy on the right side.   Current Outpatient Medications on File Prior to Visit  Medication Sig Dispense Refill   albuterol (PROVENTIL HFA;VENTOLIN HFA) 108 (90 Base) MCG/ACT inhaler Inhale 2 puffs into the lungs every 6 (six) hours as needed for wheezing or shortness of breath. (Patient taking differently: Inhale 2 puffs into the  lungs every 4 (four) hours as needed for wheezing or shortness of breath.) 1 Inhaler 0   amLODipine (NORVASC) 5 MG tablet Take 5 mg by mouth daily.   2   amphetamine-dextroamphetamine (ADDERALL XR) 10 MG 24 hr capsule Take 10 mg by mouth daily.     aspirin-acetaminophen-caffeine (EXCEDRIN MIGRAINE) 250-250-65 MG tablet Take 1 tablet by mouth daily as needed for headache.     cariprazine (VRAYLAR) capsule Take 3 mg by mouth at bedtime.     divalproex (DEPAKOTE) 500 MG DR tablet Take 1 tablet (500 mg total) by mouth at bedtime. 90 tablet 3   dronabinol (MARINOL) 2.5 MG capsule TAKE 1 CAPSULE BY MOUTH 3 TIMES DAILY WITH MEALS. (Patient taking differently: Take 2.5 mg by mouth 3 (three) times daily with meals.) 90 capsule 1   EPINEPHrine 0.3 mg/0.3 mL IJ SOAJ injection Inject 0.3 mg into the muscle as needed for anaphylaxis.      famotidine (PEPCID) 40 MG tablet Take 1 tablet (40 mg total) by mouth at bedtime. 90 tablet 3   Fingolimod HCl (GILENYA) 0.5 MG CAPS  Take 1 capsule (0.5 mg total) by mouth daily. 30 capsule 11   guanFACINE (TENEX) 1 MG tablet Take 1 mg by mouth daily.     loratadine (CLARITIN) 10 MG tablet Take 10 mg by mouth daily as needed for allergies.     medroxyPROGESTERone (DEPO-PROVERA) 150 MG/ML injection Inject 150 mg into the muscle every 3 (three) months.     Naphazoline-Glycerin (CLEAR EYES REDNESS RELIEF OP) Place 1 drop into both eyes daily as needed (irrtiation).     omeprazole (PRILOSEC) 20 MG capsule TAKE 1 CAPSULE (20 MG TOTAL) BY MOUTH 2 (TWO) TIMES DAILY BEFORE A MEAL. 180 capsule 1   pregabalin (LYRICA) 225 MG capsule Take 1 capsule (225 mg total) by mouth 2 (two) times daily. 60 capsule 2   topiramate (TOPAMAX) 100 MG tablet Take 1.5 tablets twice a day 270 tablet 3   traZODone (DESYREL) 50 MG tablet Take 100 mg by mouth at bedtime.     methocarbamol (ROBAXIN) 750 MG tablet Take 1 tablet (750 mg total) by mouth 2 (two) times daily as needed for muscle spasms. 60 tablet 2    Current Facility-Administered Medications on File Prior to Visit  Medication Dose Route Frequency Provider Last Rate Last Admin   metoCLOPramide (REGLAN) injection 10 mg  10 mg Intravenous Once Milton Ferguson, MD         Observations/Objective:   Vitals:   06/16/20 0806  Weight: 250 lb (113.4 kg)  Height: 5\' 3"  (1.6 m)   GEN:  The patient appears stated age and is in NAD.  Neurological examination: Patient is awake, alert. No aphasia or dysarthria. Intact fluency and comprehension. Remote and recent memory intact. Able to name and repeat. Cranial nerves: Extraocular movements intact. No facial asymmetry. Motor: moves all extremities symmetrically, at least anti-gravity x 4.    Assessment and Plan:   This is a 40 yo RH woman with a history of relapsing-remitting multiple sclerosis, migraine with aura, anxiety with panic attacks, conversion disorder with psychogenic seizures. After rescheduling several times due to transportation issues, she had her repeat MRI brain, cervical, thoracic spine done 06/2020. Compared to 2020 imaging, there were new lesions in the T-spine (T4, T9), no acute changes/enhancing lesions. MRI brain and cervical spine stable. She denies missing doses of Gilenya. We discussed at this point switching to a different medication, options including Ocrevus and Mavenclad. Discussed that she will need a washout period with the Gilenya and risk of a relapse during that time. We will look into the best option for her. Proceed with Botox for migraine prophylaxis. She has not had any psychogenic seizures in over a year. Continue follow-up with Ortho/Pain Management. Follow-up in 3 months, call for any changes.    Follow Up Instructions:   -I discussed the assessment and treatment plan with the patient. The patient was provided an opportunity to ask questions and all were answered. The patient agreed with the plan and demonstrated an understanding of the instructions.   The  patient was advised to call back or seek an in-person evaluation if the symptoms worsen or if the condition fails to improve as anticipated.     Cameron Sprang, MD

## 2020-06-19 ENCOUNTER — Telehealth: Payer: Self-pay | Admitting: Neurology

## 2020-06-19 NOTE — Telephone Encounter (Signed)
Noted  

## 2020-06-19 NOTE — Telephone Encounter (Signed)
Pt called in stating she forgot to mention in her visit on 06/16/20 that her hand will spasm or jump. She drops things and draws lines across her paper if she is writing. It's in both hands, but is worse in her right hand.

## 2020-06-20 ENCOUNTER — Encounter (HOSPITAL_COMMUNITY): Payer: Medicare Other

## 2020-06-21 ENCOUNTER — Telehealth: Payer: Self-pay | Admitting: Neurology

## 2020-06-21 DIAGNOSIS — F3132 Bipolar disorder, current episode depressed, moderate: Secondary | ICD-10-CM | POA: Diagnosis not present

## 2020-06-21 DIAGNOSIS — F064 Anxiety disorder due to known physiological condition: Secondary | ICD-10-CM | POA: Diagnosis not present

## 2020-06-21 DIAGNOSIS — F4312 Post-traumatic stress disorder, chronic: Secondary | ICD-10-CM | POA: Diagnosis not present

## 2020-06-21 DIAGNOSIS — F902 Attention-deficit hyperactivity disorder, combined type: Secondary | ICD-10-CM | POA: Diagnosis not present

## 2020-06-21 NOTE — Telephone Encounter (Signed)
Pt called in and left a message wanting to get a referral to a new psychiatrist that can understand her medical condition.

## 2020-06-21 NOTE — Telephone Encounter (Signed)
Pls have her check who is covered by her insurance plan and who has she seen on that list, she can send me the list and we can see who may be able to see/help her. Thanks

## 2020-06-21 NOTE — Telephone Encounter (Signed)
Spoke with pt she is going to call and see who is on her insurance plan and call us back to let us know

## 2020-06-23 ENCOUNTER — Other Ambulatory Visit: Payer: Self-pay

## 2020-06-23 ENCOUNTER — Ambulatory Visit (INDEPENDENT_AMBULATORY_CARE_PROVIDER_SITE_OTHER): Payer: Medicare Other | Admitting: Neurology

## 2020-06-23 DIAGNOSIS — G43709 Chronic migraine without aura, not intractable, without status migrainosus: Secondary | ICD-10-CM

## 2020-06-23 MED ORDER — ONABOTULINUMTOXINA 100 UNITS IJ SOLR
155.0000 [IU] | Freq: Once | INTRAMUSCULAR | Status: AC
  Administered 2020-06-23: 155 [IU] via INTRAMUSCULAR

## 2020-06-23 NOTE — Progress Notes (Signed)
[facility]   Procedure Note Botox  Attending: Dr. Metta Clines  Preoperative Diagnosis(es): Chronic migraine  Consent obtained from: The patient Benefits discussed included, but were not limited to decreased muscle tightness, increased joint range of motion, and decreased pain.  Risk discussed included, but were not limited pain and discomfort, bleeding, bruising, excessive weakness, venous thrombosis, muscle atrophy and dysphagia.  Anticipated outcomes of the procedure as well as he risks and benefits of the alternatives to the procedure, and the roles and tasks of the personnel to be involved, were discussed with the patient, and the patient consents to the procedure and agrees to proceed. A copy of the patient medication guide was given to the patient which explains the blackbox warning.  Patients identity and treatment sites confirmed Yes.  .  Details of Procedure: Skin was cleaned with alcohol. Prior to injection, the needle plunger was aspirated to make sure the needle was not within a blood vessel.  There was no blood retrieved on aspiration.    Following is a summary of the muscles injected  And the amount of Botulinum toxin used:  Dilution 200 units of Botox was reconstituted with 4 ml of preservative free normal saline. Time of reconstitution: At the time of the office visit (<30 minutes prior to injection)   Injections  155 total units of Botox was injected with a 30 gauge needle.  Injection Sites: L occipitalis: 15 units- 3 sites  R occiptalis: 15 units- 3 sites  L upper trapezius: 15 units- 3 sites R upper trapezius: 15 units- 3 sits          L paraspinal: 10 units- 2 sites R paraspinal: 10 units- 2 sites  Face L frontalis(2 injection sites):10 units   R frontalis(2 injection sites):10 units         L corrugator: 5 units   R corrugator: 5 units           Procerus: 5 units   L temporalis: 20 units R temporalis: 20 units   Agent:  200 units of botulinum Type  A (Onobotulinum Toxin type A) was reconstituted with 4 ml of preservative free normal saline.  Time of reconstitution: At the time of the office visit (<30 minutes prior to injection)     Total injected (Units):  155  Total wasted (Units):  none wasted  Patient tolerated procedure well without complications.   Reinjection is anticipated in 3 months.

## 2020-06-24 ENCOUNTER — Telehealth (INDEPENDENT_AMBULATORY_CARE_PROVIDER_SITE_OTHER): Payer: Self-pay | Admitting: Internal Medicine

## 2020-06-24 ENCOUNTER — Other Ambulatory Visit: Payer: Self-pay | Admitting: Gastroenterology

## 2020-06-24 DIAGNOSIS — R112 Nausea with vomiting, unspecified: Secondary | ICD-10-CM

## 2020-06-24 DIAGNOSIS — K219 Gastro-esophageal reflux disease without esophagitis: Secondary | ICD-10-CM

## 2020-06-24 NOTE — Telephone Encounter (Signed)
Patient called requesting prescription for dronabinol. She she ran out of her prescription yesterday and she takes it 3 times a day. She was last seen by Ms. [name], NP on 05/30/2020. Patient was given 1 month supply.  Prescription sent to [facility]. I advised patient to call our GI office at least 2 days before her next refill.

## 2020-06-26 ENCOUNTER — Telehealth: Payer: Self-pay

## 2020-06-26 DIAGNOSIS — F4312 Post-traumatic stress disorder, chronic: Secondary | ICD-10-CM | POA: Diagnosis not present

## 2020-06-26 DIAGNOSIS — F902 Attention-deficit hyperactivity disorder, combined type: Secondary | ICD-10-CM | POA: Diagnosis not present

## 2020-06-26 DIAGNOSIS — R112 Nausea with vomiting, unspecified: Secondary | ICD-10-CM

## 2020-06-26 DIAGNOSIS — F3132 Bipolar disorder, current episode depressed, moderate: Secondary | ICD-10-CM | POA: Diagnosis not present

## 2020-06-26 DIAGNOSIS — K219 Gastro-esophageal reflux disease without esophagitis: Secondary | ICD-10-CM

## 2020-06-26 DIAGNOSIS — F064 Anxiety disorder due to known physiological condition: Secondary | ICD-10-CM | POA: Diagnosis not present

## 2020-06-26 NOTE — Telephone Encounter (Signed)
Pt had a virtual visit with you on 05/30/20 and you were suppose to refill her Dronabinol 2.5 mg and it was never filled. The pt finally after trying numerous of times got in contact with Dr. Laural Golden and he gave her a month supply because it was mentioned in her office note. She will need more refills after she is done with this.

## 2020-06-27 ENCOUNTER — Other Ambulatory Visit: Payer: Self-pay

## 2020-06-27 ENCOUNTER — Encounter (HOSPITAL_COMMUNITY): Payer: Self-pay

## 2020-06-27 ENCOUNTER — Ambulatory Visit (HOSPITAL_COMMUNITY): Payer: Medicare Other | Attending: Orthopaedic Surgery

## 2020-06-27 DIAGNOSIS — M5442 Lumbago with sciatica, left side: Secondary | ICD-10-CM | POA: Insufficient documentation

## 2020-06-27 DIAGNOSIS — M542 Cervicalgia: Secondary | ICD-10-CM | POA: Diagnosis not present

## 2020-06-27 DIAGNOSIS — G8929 Other chronic pain: Secondary | ICD-10-CM | POA: Insufficient documentation

## 2020-06-27 DIAGNOSIS — M6281 Muscle weakness (generalized): Secondary | ICD-10-CM | POA: Diagnosis not present

## 2020-06-27 MED ORDER — DRONABINOL 2.5 MG PO CAPS
ORAL_CAPSULE | ORAL | 3 refills | Status: DC
Start: 2020-06-27 — End: 2020-08-03

## 2020-06-27 NOTE — Telephone Encounter (Signed)
I provided additional refills but it will need to be faxed. I can't send it electronically.

## 2020-06-27 NOTE — Addendum Note (Signed)
Addended by: Annitta Needs on: 06/27/2020 02:38 PM   Modules accepted: Orders

## 2020-06-27 NOTE — Therapy (Signed)
[place], [place], [address] Phone: [phone]   Fax:  [phone]  Physical Therapy Treatment  Patient Details  Name: Emily [name] MRN: [id number] Date of Birth: 11-14-1980 Referring Provider (PT): Rodell Perna   Encounter Date: 06/27/2020   PT End of Session - 06/27/20 1323     Visit Number 5    Number of Visits 8    Date for PT Re-Evaluation 07/04/20    Authorization Type medicare primary and secondary medicaid    Progress Note Due on Visit 10    PT Start Time 1305    PT Stop Time 6834    PT Time Calculation (min) 40 min    Activity Tolerance Patient tolerated treatment well    Behavior During Therapy [facility] for tasks assessed/performed             Past Medical History:  Diagnosis Date   ADHD    Anxiety    Asthma as a child   Chronic back pain    Chronic pain    Conversion disorder    DDD (degenerative disc disease), cervical    Depression    External hemorrhoid    Fatty liver    Fibromyalgia    GERD (gastroesophageal reflux disease)    History of kidney stones    passed stones   HTN (hypertension) 02/27/2015   JC virus antibody positive    Migraines    MS (multiple sclerosis) ([place]) 06/2006   Neuromuscular disorder ([place])    MS   Neuropathy    OA (osteoarthritis)    Ovarian cyst    Panic attack    Perforated bowel ([place]) 2009   colostomy bag for 3 motnhs   Pneumonia    Pseudoseizures ([place])    last seizure was in 08/2018    PTSD (post-traumatic stress disorder)    PTSD (post-traumatic stress disorder)    S/P emergency C-section    Seasonal allergies    Urinary urgency    Vertigo    Wears contact lenses    Wears glasses     Past Surgical History:  Procedure Laterality Date   ABDOMINAL SURGERY     ABDOMINAL WOUND EXPLORATION   03/19/2019   Abdominal Wound Exploration with Explantation of Mesh   APPENDECTOMY     BOWEL RESECTION  01/2007   with colostomy   BOWEL RESECTION N/A  06/14/2019   Procedure: SMALL BOWEL RESECTION;  Surgeon: Ralene Ok, MD;  Location: [place];  Service: General;  Laterality: N/A;   CESAREAN SECTION N/A 07/12/2015   Procedure: CESAREAN SECTION;  Surgeon: Guss Bunde, MD;  Location: [facility];  Service: Obstetrics;  Laterality: N/A;   COLON SURGERY     part of colon and intestines removed; colostomy 2008   COLONOSCOPY WITH PROPOFOL N/A 01/29/2017   Procedure: COLONOSCOPY WITH PROPOFOL;  Surgeon: Ileana Roup, MD;  Location: [facility];  Service: General;  Laterality: N/A;   COLOSTOMY CLOSURE  04/2007   EVALUATION UNDER ANESTHESIA WITH HEMORRHOIDECTOMY N/A 06/11/2018   Procedure: ANORECTAL EXAM UNDER ANESTHESIA WITH HEMORRHOIDECTOMY;  Surgeon: Ileana Roup, MD;  Location: [place];  Service: General;  Laterality: N/A;   EXCISIONAL HEMORRHOIDECTOMY     EXTREMITY CYST EXCISION  1994   right leg   HERNIA REPAIR     INCISIONAL HERNIA REPAIR N/A 02/16/2016   Procedure: HERNIA REPAIR INCISIONAL;  Surgeon: Fanny Skates, MD;  Location: [facility];  Service: General;  Laterality:  N/A;   INSERTION OF MESH  02/16/2016   Procedure: INSERTION OF MESH;  Surgeon: Fanny Skates, MD;  Location: [facility];  Service: General;;   LAPAROSCOPY N/A 06/14/2019   Procedure: ATTEMPTED DIAGNOSTIC LAPAROSCOPY;  Surgeon: Ralene Ok, MD;  Location: [place];  Service: General;  Laterality: N/A;   LAPAROTOMY  03/19/2019   Procedure: Abdominal Wound Exploration with Explantation of Mesh;  Surgeon: Ralene Ok, MD;  Location: [place];  Service: General;;   LAPAROTOMY N/A 06/14/2019   Procedure: EXPLORATORY LAPAROTOMY;  Surgeon: Ralene Ok, MD;  Location: [place] [facility];  Service: General;  Laterality: N/A;   LYSIS OF ADHESION N/A 06/14/2019   Procedure: LYSIS OF ADHESIONS;  Surgeon: Ralene Ok, MD;  Location: [facility];  Service: General;  Laterality: N/A;   RADIOLOGY WITH ANESTHESIA N/A 03/06/2017   Procedure: MRI WITH ANESTHESIA OF  CERVICAL SPINE WITHOUT CONTRAST, MRI OF LUMBAR SPINE WITHOUT CONTRAST;  Surgeon: Radiologist, Medication, MD;  Location: [facility] [place];  Service: Radiology;  Laterality: N/A;   RADIOLOGY WITH ANESTHESIA N/A 09/15/2018   Procedure: MRI OF BRAIN WITH AND WITHOUT CONTRAST;  Surgeon: Radiologist, Medication, MD;  Location: [place];  Service: Radiology;  Laterality: N/A;   RADIOLOGY WITH ANESTHESIA N/A 10/13/2018   Procedure: MRI OF BRAIN WITH AND WITHOUT CONTRAST;  Surgeon: Radiologist, Medication, MD;  Location: [place];  Service: Radiology;  Laterality: N/A;   RADIOLOGY WITH ANESTHESIA N/A 10/26/2018   Procedure: MRI UNDER ANESTHESIA; HEAD, CERVICAL AND THORASCIC SPINE;  Surgeon: Radiologist, Medication, MD;  Location: [place];  Service: Radiology;  Laterality: N/A;   RADIOLOGY WITH ANESTHESIA N/A 06/08/2020   Procedure: MRI WITH ANESTHESIA BRAIN WITH AND WITHOUT CONTRAST.,  CERVICAL BRAIN WITH AND WITHOUT CONTRAST, THORACIC SPINE WITH AND WITHOUT;  Surgeon: Radiologist, Medication, MD;  Location: [place];  Service: Radiology;  Laterality: N/A;   SCAR REVISION  01/21/2011   Procedure: SCAR REVISION;  Surgeon: Hermelinda Dellen;  Location: [facility];  Service: Plastics;  Laterality: N/A;  exploration of scar of abdomen and repair of defect   TOOTH EXTRACTION Left 10/2016   TRANSRECTAL DRAINAGE OF PELVIC ABSCESS      There were no vitals filed for this visit.   Subjective Assessment - 06/27/20 1321     Subjective Pt notes contiuned LBP and neck pain with difficulty in performing activities due to family dynamics.    Pertinent History anxiety, MS, fibromyalgia    Limitations Lifting;Standing;Walking;House hold activities    Currently in Pain? Yes    Pain Score 7     Pain Location Back    Pain Orientation Lower    Pain Descriptors / Indicators Aching;Throbbing    Pain Type Chronic pain                [facility] PT Assessment - 06/27/20 0001       Assessment   Medical Diagnosis neck and low  back pain    Referring Provider (PT) Rodell Perna    Next MD Visit as needed                           [facility] Adult PT Treatment/Exercise - 06/27/20 0001       Lumbar Exercises: Supine   Ab Set 20 reps    AB Set Limitations 5"    Bridge 20 reps;2 seconds    Bridge Limitations trials with ankle DF/PF    Straight Leg Raise 15 reps    Other Supine  Lumbar Exercises ball squeeze 2x10                    PT Education - 06/27/20 1338     Education Details eduacted on benefits of consistent, regular physical activity to improve core and LE strength to improve ambulation tolerance    Person(s) Educated Patient    Methods Explanation    Comprehension Verbalized understanding              PT Short Term Goals - 06/27/20 1326       PT SHORT TERM GOAL #1   Title Patient will be able to ambulate at least 2 minutes without increase in low back pain to improve overall function    Baseline pt reports 2-5 min without increase in pain    Time 4    Period Weeks    Status Achieved    Target Date 06/06/20      PT SHORT TERM GOAL #2   Title Patient will be able to demonstrate at least 45 degrees of cervical rotation in both directions to demonstrate improved neck mobilty    Time 4    Period Weeks    Status On-going    Target Date 06/06/20      PT SHORT TERM GOAL #3   Title Patient will be independent in self management strategies to improve quality of life and functional outcomes.    Time 4    Period Weeks    Status On-going    Target Date 06/06/20               PT Long Term Goals - 05/09/20 1405       PT LONG TERM GOAL #1   Title Patient will report at least 50% improvement in overall symptoms and/or function to demonstrate improved functional mobility    Time 8    Period Weeks    Status New    Target Date 07/04/20      PT LONG TERM GOAL #2   Title Patient will be able to bend forward with good mechanics to change her daughter's diaper to  reduce stress on her lumbar spine.    Time 8    Period Weeks    Status New    Target Date 07/04/20      PT LONG TERM GOAL #3   Title Patient will improve on back FOTO score to meet predicted outcomes to demonstrate improved functional mobility.    Time 8    Period Weeks    Status New    Target Date 07/04/20                   Plan - 06/27/20 1333     Clinical Impression Statement Pt returns to clinic following prolonged abscense due to family and transportation dynamics.  Continues to demonstrate complaints of chronic LBP but notes some improvement in her ability to ambulate up to 5 min before pain is increased and requires seated rest period.  Difficulty with SLR and bridging maneuvers demonstrate 2+/5 to 3-/5 SLR maneuver and 3" clearance with bridge. Continued POC indicated to progress general strength and conditioning to improve activity tolerance.    Personal Factors and Comorbidities Comorbidity 1;Comorbidity 2;Comorbidity 3+;Fitness    Comorbidities 4 abdominal surgeries (history, fibromyalgia, anxiety, MS,    Examination-Activity Limitations Bed Mobility;Bend;Caring for Others;Carry;Dressing;Lift;Locomotion Level;Reach Overhead;Transfers;Stand;Squat    Examination-Participation Restrictions Driving;Cleaning;Meal Prep    Stability/Clinical Decision Making Evolving/Moderate complexity    Rehab Potential Good  PT Frequency 2x / week    PT Duration 6 weeks    PT Treatment/Interventions ADLs/Self Care Home Management;Aquatic Therapy;Biofeedback;Cryotherapy;Traction;Moist Heat;Gait training;Electrical Stimulation;Therapeutic exercise;Therapeutic activities;Manual techniques;Patient/family education;Neuromuscular re-education;Joint Manipulations    PT Next Visit Plan LE and core strengthening, cardio, STM for pain management as tolerated.  Next session begin clams for core and add nustep at [facility] for cardio (N/B time as waits on daughter)    PT [place] sit to stands,  BUE shoulder extension, high row, ER with red t-band    Consulted and Agree with Plan of Care Patient             Patient will benefit from skilled therapeutic intervention in order to improve the following deficits and impairments:  Decreased range of motion, Decreased activity tolerance, Pain, Decreased endurance, Decreased strength, Difficulty walking, Decreased mobility, Decreased balance  Visit Diagnosis: Muscle weakness (generalized)  Cervicalgia  Chronic midline low back pain with left-sided sciatica     Problem List Patient Active Problem List   Diagnosis Date Noted   Nausea without vomiting 05/30/2020   Abnormal MRI, lumbar spine (03/06/2017) 04/13/2020   Chronic low back pain (Bilateral) w/o sciatica 04/04/2020   Conversion disorder with seizures or convulsions 12/06/2019   Arthropathy of left knee 12/01/2019   Osteoarthritis of knees (Bilateral) 11/02/2019   Anxiety due to invasive procedure 11/02/2019   Unstable knee, left 10/25/2019   S/P small bowel resection 06/14/2019   Infected hernioplasty mesh ([place]) 03/19/2019   Pseudoseizures ([place]) 02/22/2019   ADHD 02/22/2019   Anxiety 02/22/2019   Other chronic pain 01/08/2019   Other cervical disc degeneration, unspecified cervical region 01/08/2019   Personal history of nicotine dependence 01/08/2019   S/P emergency C-section    MS (multiple sclerosis) ([place])    Chronic pain    Panic attack    Open wound of abdominal wall, anterior, complicated, sequela 90/24/0973   Postoperative wound infection 09/20/2018   History of substance use disorder 07/19/2018   Chronic nausea 04/13/2018   Nausea with vomiting 03/23/2018   Pharmacologic therapy 03/16/2018   Disorder of skeletal system 03/16/2018   Problems influencing health status 03/16/2018   Abnormal MRI, cervical spine (03/06/17) 03/16/2018   Morbid obesity with BMI of 45.0-49.9, adult ([place]) 03/16/2018   Chronic knee pain (Bilateral) 02/18/2018   Cervical  facet hypertrophy 04/29/2017   Cervical facet syndrome (Bilateral) (R>L) 04/29/2017   Spondylosis without myelopathy or radiculopathy, cervical region 04/29/2017   Spondylosis without myelopathy or radiculopathy, lumbosacral region 03/04/2017   Cervicogenic headache 02/10/2017   Occipital headache 02/10/2017   Trigger point with back pain (Left) 02/10/2017   History of fainting (vasovagal) 02/10/2017   History of vasovagal syncope 02/10/2017   Neurogenic pain 01/23/2017   Chronic musculoskeletal pain 12/02/2016   Cervical radiculitis (Bilateral) 11/12/2016   Chronic upper back pain (1ry area of Pain) (Bilateral) (R>L) 11/01/2016   Chronic hand pain (3ry area of Pain) (Bilateral) (L>R) 11/01/2016   Chronic wrist pain (3ry area of Pain) (Bilateral) (L>R) 11/01/2016   Chronic foot/toes pain (4th area of Pain) (Bilateral) (R>L) 11/01/2016   Chronic upper extremity pain (2ry area of Pain) (Bilateral) (L>R) 11/01/2016   Long term prescription benzodiazepine use 11/01/2016   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) 11/01/2016   Cervical foraminal stenosis (Bilateral) (C6-7) 11/01/2016   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   DDD (degenerative disc disease), thoracic 11/01/2016   DDD (degenerative disc disease), lumbar 11/01/2016   Lumbar facet arthropathy 11/01/2016  Lumbar facet syndrome (Bilateral) (R>L) 11/01/2016   Vitamin D deficiency 10/28/2016   Opiate use 07/23/2016   Chronic neck pain (1ry area of Pain) (Bilateral) (R>L) 07/23/2016   Chronic low back pain (2ry area of Pain) (Bilateral) (L>R) w/ sciatica (Bilateral) 07/23/2016   Chronic knee pain (5th area of Pain) (Left) 07/23/2016   Midline thoracic back pain 07/23/2016   Chronic lower extremity pain (Bilateral) 07/23/2016   Incarcerated incisional hernia 02/16/2016   Incisional hernia 02/15/2016   Carpal tunnel syndrome (3ry area of Pain) (Bilateral) (L>R) 11/13/2015   Substance abuse ([place]) 09/07/2015    Paresthesia 08/02/2015   Chronic pain syndrome 08/02/2015   Postpartum endometritis 07/21/2015   PTSD (post-traumatic stress disorder) 05/16/2015   Severe episode of recurrent major depressive disorder, 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   GAD (generalized anxiety disorder) 05/16/2015   Difficult intravenous access 05/08/2015   Right optic neuritis 04/27/2015   Conversion disorder with attacks or seizures, persistent, with psychological stressor 03/28/2015   Migraine with aura and without status migrainosus, not intractable 03/28/2015   Generalized anxiety disorder 03/28/2015   Conversion disorder 03/28/2015   HTN (hypertension) 02/27/2015   Perforated bowel ([place]) 02/27/2015   Depression 12/27/2014   Multiple sclerosis ([place]) 12/27/2014   Migraine 12/27/2014   Seizure disorder ([place]) 12/27/2014   Smoker 12/27/2014   Severe obesity (BMI >= 40) ([place]) 12/27/2014   DDD (degenerative disc disease), cervical 07/28/2014   Major depressive disorder, single episode 11/02/2013   Gastroesophageal reflux disease 10/25/2010   Tonsillitis 10/25/2010   1:40 PM, 06/27/20 M. Sherlyn Lees, PT, DPT Physical Therapist- [place] Office Number: [phone]   [facility] [address] [place], [place], [address] Phone: [phone]   Fax:  [phone]  Name: Emily [name] MRN: [id number] Date of Birth: August 17, 1980

## 2020-06-27 NOTE — Telephone Encounter (Signed)
Noted  Pt's Rx faxed to her Pharmacy.

## 2020-07-03 DIAGNOSIS — Z3042 Encounter for surveillance of injectable contraceptive: Secondary | ICD-10-CM | POA: Diagnosis not present

## 2020-07-04 ENCOUNTER — Other Ambulatory Visit: Payer: Self-pay

## 2020-07-04 ENCOUNTER — Ambulatory Visit (HOSPITAL_COMMUNITY): Payer: Medicare Other

## 2020-07-04 DIAGNOSIS — M6281 Muscle weakness (generalized): Secondary | ICD-10-CM | POA: Diagnosis not present

## 2020-07-04 DIAGNOSIS — G8929 Other chronic pain: Secondary | ICD-10-CM

## 2020-07-04 DIAGNOSIS — M542 Cervicalgia: Secondary | ICD-10-CM

## 2020-07-04 DIAGNOSIS — M5442 Lumbago with sciatica, left side: Secondary | ICD-10-CM | POA: Diagnosis not present

## 2020-07-04 NOTE — Therapy (Signed)
[facility] [place], [place], [address] Phone: [phone]   Fax:  [phone]  Physical Therapy Treatment, Progress Note, Recertification  Patient Details  Name: Emily [name] MRN: [id number] Date of Birth: 06-May-1980 Referring Provider (PT): Rodell Perna  Progress Note Reporting Period 05/09/20 to 07/04/20  See note below for Objective Data and Assessment of Progress/Goals.     Encounter Date: 07/04/2020   PT End of Session - 07/04/20 1307     Visit Number 6    Number of Visits 8    Date for PT Re-Evaluation 08/29/20    Authorization Type medicare primary and secondary medicaid    Progress Note Due on Visit 16    PT Start Time 1300    PT Stop Time 2119    PT Time Calculation (min) 45 min    Activity Tolerance Patient tolerated treatment well    Behavior During Therapy WFL for tasks assessed/performed             Past Medical History:  Diagnosis Date   ADHD    Anxiety    Asthma as a child   Chronic back pain    Chronic pain    Conversion disorder    DDD (degenerative disc disease), cervical    Depression    External hemorrhoid    Fatty liver    Fibromyalgia    GERD (gastroesophageal reflux disease)    History of kidney stones    passed stones   HTN (hypertension) 02/27/2015   JC virus antibody positive    Migraines    MS (multiple sclerosis) ([place]) 06/2006   Neuromuscular disorder (HCC)    MS   Neuropathy    OA (osteoarthritis)    Ovarian cyst    Panic attack    Perforated bowel ([place]) 2009   colostomy bag for 3 motnhs   Pneumonia    Pseudoseizures ([place])    last seizure was in 08/2018    PTSD (post-traumatic stress disorder)    PTSD (post-traumatic stress disorder)    S/P emergency C-section    Seasonal allergies    Urinary urgency    Vertigo    Wears contact lenses    Wears glasses     Past Surgical History:  Procedure Laterality Date   ABDOMINAL SURGERY     ABDOMINAL WOUND EXPLORATION    03/19/2019   Abdominal Wound Exploration with Explantation of Mesh   APPENDECTOMY     BOWEL RESECTION  01/2007   with colostomy   BOWEL RESECTION N/A 06/14/2019   Procedure: SMALL BOWEL RESECTION;  Surgeon: Ralene Ok, MD;  Location: [facility];  Service: General;  Laterality: N/A;   CESAREAN SECTION N/A 07/12/2015   Procedure: CESAREAN SECTION;  Surgeon: Guss Bunde, MD;  Location: [facility];  Service: Obstetrics;  Laterality: N/A;   COLON SURGERY     part of colon and intestines removed; colostomy 2008   COLONOSCOPY WITH PROPOFOL N/A 01/29/2017   Procedure: COLONOSCOPY WITH PROPOFOL;  Surgeon: Ileana Roup, MD;  Location: [facility];  Service: General;  Laterality: N/A;   COLOSTOMY CLOSURE  04/2007   EVALUATION UNDER ANESTHESIA WITH HEMORRHOIDECTOMY N/A 06/11/2018   Procedure: ANORECTAL EXAM UNDER ANESTHESIA WITH HEMORRHOIDECTOMY;  Surgeon: Ileana Roup, MD;  Location: [facility];  Service: General;  Laterality: N/A;   EXCISIONAL HEMORRHOIDECTOMY     EXTREMITY CYST EXCISION  1994   right leg   HERNIA REPAIR     INCISIONAL  HERNIA REPAIR N/A 02/16/2016   Procedure: HERNIA REPAIR INCISIONAL;  Surgeon: Fanny Skates, MD;  Location: [facility];  Service: General;  Laterality: N/A;   INSERTION OF MESH  02/16/2016   Procedure: INSERTION OF MESH;  Surgeon: Fanny Skates, MD;  Location: [facility];  Service: General;;   LAPAROSCOPY N/A 06/14/2019   Procedure: ATTEMPTED DIAGNOSTIC LAPAROSCOPY;  Surgeon: Ralene Ok, MD;  Location: [facility];  Service: General;  Laterality: N/A;   LAPAROTOMY  03/19/2019   Procedure: Abdominal Wound Exploration with Explantation of Mesh;  Surgeon: Ralene Ok, MD;  Location: [facility];  Service: General;;   LAPAROTOMY N/A 06/14/2019   Procedure: EXPLORATORY LAPAROTOMY;  Surgeon: Ralene Ok, MD;  Location: [facility];  Service: General;  Laterality: N/A;   LYSIS OF ADHESION N/A 06/14/2019   Procedure: LYSIS OF ADHESIONS;  Surgeon:  Ralene Ok, MD;  Location: [facility];  Service: General;  Laterality: N/A;   RADIOLOGY WITH ANESTHESIA N/A 03/06/2017   Procedure: MRI WITH ANESTHESIA OF CERVICAL SPINE WITHOUT CONTRAST, MRI OF LUMBAR SPINE WITHOUT CONTRAST;  Surgeon: Radiologist, Medication, MD;  Location: [facility];  Service: Radiology;  Laterality: N/A;   RADIOLOGY WITH ANESTHESIA N/A 09/15/2018   Procedure: MRI OF BRAIN WITH AND WITHOUT CONTRAST;  Surgeon: Radiologist, Medication, MD;  Location: [facility];  Service: Radiology;  Laterality: N/A;   RADIOLOGY WITH ANESTHESIA N/A 10/13/2018   Procedure: MRI OF BRAIN WITH AND WITHOUT CONTRAST;  Surgeon: Radiologist, Medication, MD;  Location: [facility];  Service: Radiology;  Laterality: N/A;   RADIOLOGY WITH ANESTHESIA N/A 10/26/2018   Procedure: MRI UNDER ANESTHESIA; HEAD, CERVICAL AND THORASCIC SPINE;  Surgeon: Radiologist, Medication, MD;  Location: [facility];  Service: Radiology;  Laterality: N/A;   RADIOLOGY WITH ANESTHESIA N/A 06/08/2020   Procedure: MRI WITH ANESTHESIA BRAIN WITH AND WITHOUT CONTRAST.,  CERVICAL BRAIN WITH AND WITHOUT CONTRAST, THORACIC SPINE WITH AND WITHOUT;  Surgeon: Radiologist, Medication, MD;  Location: [facility];  Service: Radiology;  Laterality: N/A;   SCAR REVISION  01/21/2011   Procedure: SCAR REVISION;  Surgeon: Hermelinda Dellen;  Location: [facility];  Service: Plastics;  Laterality: N/A;  exploration of scar of abdomen and repair of defect   TOOTH EXTRACTION Left 10/2016   TRANSRECTAL DRAINAGE OF PELVIC ABSCESS      There were no vitals filed for this visit.   Subjective Assessment - 07/04/20 1335     Subjective Pt reports some improvement since beginning therapy, "I feel about 10% better"    Pertinent History anxiety, MS, fibromyalgia    Limitations Lifting;Standing;Walking;House hold activities    Patient Stated Goals to be able to move around a little bit better, being able to bend over to change daughter's diaper. Being able to bend neck to her  chest without severe pain    Currently in Pain? Yes    Pain Score 7     Pain Location Back    Pain Orientation Lower    Pain Descriptors / Indicators Aching;Throbbing    Multiple Pain Sites Yes    Pain Score 8    Pain Location Neck    Pain Orientation Left    Pain Descriptors / Indicators Aching;Burning;Shooting    Pain Type Chronic pain    Pain Radiating Towards LUE    Pain Onset More than a month ago    Aggravating Factors  head/neck rotation                [facility] PT Assessment - 07/04/20 0001  Assessment   Medical Diagnosis neck and low back pain    Referring Provider (PT) Rodell Perna    Next MD Visit as needed      Posture/Postural Control   Posture/Postural Control Postural limitations    Postural Limitations Rounded Shoulders;Forward head      AROM   Cervical Flexion WNL    Cervical Extension 10% limited    Cervical - Right Rotation 45 degrees    Cervical - Left Rotation 35 degrees    Lumbar Flexion 10% limited    Lumbar Extension 10% limited                           [facility] Adult PT Treatment/Exercise - 07/04/20 0001       Exercises   Exercises Neck      Neck Exercises: Seated   Neck Retraction 10 reps    Neck Retraction Limitations reports increased LUE symptoms with maneuver      Neck Exercises: Supine   Neck Retraction 15 reps;3 secs      Lumbar Exercises: Supine   Ab Set 15 reps;5 seconds    Dead Bug 15 reps    Dead Bug Limitations alternating UE and knee for tactile cue/target    Straight Leg Raise 15 reps    Other Supine Lumbar Exercises ball squeeze 1x15                      PT Short Term Goals - 07/04/20 1311       PT SHORT TERM GOAL #1   Title Patient will be able to ambulate at least 2 minutes without increase in low back pain to improve overall function    Baseline pt reports 2-5 min without increase in pain    Time 4    Period Weeks    Status Achieved    Target Date 06/06/20      PT SHORT  TERM GOAL #2   Title Patient will be able to demonstrate at least 45 degrees of cervical rotation in both directions to demonstrate improved neck mobilty    Baseline 45 degrees right rotation; 35 degrees left rotation    Time 4    Period Weeks    Status On-going    Target Date 07/18/20      PT SHORT TERM GOAL #3   Title Patient will be independent in self management strategies to improve quality of life and functional outcomes.    Baseline cues for body mechanics and c-spine postural alignment    Time 4    Period Weeks    Status On-going    Target Date 07/18/20               PT Long Term Goals - 07/04/20 1308       PT LONG TERM GOAL #1   Title Patient will report at least 50% improvement in overall symptoms and/or function to demonstrate improved functional mobility    Baseline 10%    Time 8    Period Weeks    Status On-going    Target Date 08/29/20      PT LONG TERM GOAL #2   Title Patient will be able to bend forward with good mechanics to change her daughter's diaper to reduce stress on her lumbar spine.    Baseline cues for squat-lift form    Time 8    Period Weeks    Status On-going    Target  Date 08/29/20      PT LONG TERM GOAL #3   Title Patient will improve on back FOTO score to meet predicted outcomes to demonstrate improved functional mobility.    Baseline 51.8% function    Time 8    Period Weeks    Status On-going    Target Date 08/29/20                   Plan - 07/04/20 1351     Clinical Impression Statement Pt presents with continued deficits in core/trunk strength, neck pain, and limited activity tolerance.  POC details reviewed and FOTO score of 51.8% function.  Continued POC indicated to improve strength, stability, and progress HEP to facilitate increased activity tolerance.    Personal Factors and Comorbidities Comorbidity 1;Comorbidity 2;Comorbidity 3+;Fitness    Comorbidities 4 abdominal surgeries (history, fibromyalgia, anxiety,  MS,    Examination-Activity Limitations Bed Mobility;Bend;Caring for Others;Carry;Dressing;Lift;Locomotion Level;Reach Overhead;Transfers;Stand;Squat    Examination-Participation Restrictions Driving;Cleaning;Meal Prep    Stability/Clinical Decision Making Evolving/Moderate complexity    Rehab Potential Good    PT Frequency 2x / week    PT Duration 6 weeks    PT Treatment/Interventions ADLs/Self Care Home Management;Aquatic Therapy;Biofeedback;Cryotherapy;Traction;Moist Heat;Gait training;Electrical Stimulation;Therapeutic exercise;Therapeutic activities;Manual techniques;Patient/family education;Neuromuscular re-education;Joint Manipulations    PT Next Visit Plan LE and core strengthening, cardio, STM for pain management as tolerated.  Next session begin clams for core and add nustep at [facility] for cardio (N/B time as waits on daughter)    PT [place] sit to stands, BUE shoulder extension, high row, ER with red t-band    Consulted and Agree with Plan of Care Patient             Patient will benefit from skilled therapeutic intervention in order to improve the following deficits and impairments:  Decreased range of motion, Decreased activity tolerance, Pain, Decreased endurance, Decreased strength, Difficulty walking, Decreased mobility, Decreased balance  Visit Diagnosis: Muscle weakness (generalized)  Cervicalgia  Chronic midline low back pain with left-sided sciatica     Problem List Patient Active Problem List   Diagnosis Date Noted   Nausea without vomiting 05/30/2020   Abnormal MRI, lumbar spine (03/06/2017) 04/13/2020   Chronic low back pain (Bilateral) w/o sciatica 04/04/2020   Conversion disorder with seizures or convulsions 12/06/2019   Arthropathy of left knee 12/01/2019   Osteoarthritis of knees (Bilateral) 11/02/2019   Anxiety due to invasive procedure 11/02/2019   Unstable knee, left 10/25/2019   S/P small bowel resection 06/14/2019   Infected  hernioplasty mesh ([place]) 03/19/2019   Pseudoseizures ([place]) 02/22/2019   ADHD 02/22/2019   Anxiety 02/22/2019   Other chronic pain 01/08/2019   Other cervical disc degeneration, unspecified cervical region 01/08/2019   Personal history of nicotine dependence 01/08/2019   S/P emergency C-section    MS (multiple sclerosis) ([place])    Chronic pain    Panic attack    Open wound of abdominal wall, anterior, complicated, sequela 40/34/7425   Postoperative wound infection 09/20/2018   History of substance use disorder 07/19/2018   Chronic nausea 04/13/2018   Nausea with vomiting 03/23/2018   Pharmacologic therapy 03/16/2018   Disorder of skeletal system 03/16/2018   Problems influencing health status 03/16/2018   Abnormal MRI, cervical spine (03/06/17) 03/16/2018   Morbid obesity with BMI of 45.0-49.9, adult ([place]) 03/16/2018   Chronic knee pain (Bilateral) 02/18/2018   Cervical facet hypertrophy 04/29/2017   Cervical facet syndrome (Bilateral) (R>L) 04/29/2017   Spondylosis without myelopathy or  radiculopathy, cervical region 04/29/2017   Spondylosis without myelopathy or radiculopathy, lumbosacral region 03/04/2017   Cervicogenic headache 02/10/2017   Occipital headache 02/10/2017   Trigger point with back pain (Left) 02/10/2017   History of fainting (vasovagal) 02/10/2017   History of vasovagal syncope 02/10/2017   Neurogenic pain 01/23/2017   Chronic musculoskeletal pain 12/02/2016   Cervical radiculitis (Bilateral) 11/12/2016   Chronic upper back pain (1ry area of Pain) (Bilateral) (R>L) 11/01/2016   Chronic hand pain (3ry area of Pain) (Bilateral) (L>R) 11/01/2016   Chronic wrist pain (3ry area of Pain) (Bilateral) (L>R) 11/01/2016   Chronic foot/toes pain (4th area of Pain) (Bilateral) (R>L) 11/01/2016   Chronic upper extremity pain (2ry area of Pain) (Bilateral) (L>R) 11/01/2016   Long term prescription benzodiazepine use 11/01/2016   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) 11/01/2016   Cervical foraminal stenosis (Bilateral) (C6-7) 11/01/2016   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   DDD (degenerative disc disease), thoracic 11/01/2016   DDD (degenerative disc disease), lumbar 11/01/2016   Lumbar facet arthropathy 11/01/2016   Lumbar facet syndrome (Bilateral) (R>L) 11/01/2016   Vitamin D deficiency 10/28/2016   Opiate use 07/23/2016   Chronic neck pain (1ry area of Pain) (Bilateral) (R>L) 07/23/2016   Chronic low back pain (2ry area of Pain) (Bilateral) (L>R) w/ sciatica (Bilateral) 07/23/2016   Chronic knee pain (5th area of Pain) (Left) 07/23/2016   Midline thoracic back pain 07/23/2016   Chronic lower extremity pain (Bilateral) 07/23/2016   Incarcerated incisional hernia 02/16/2016   Incisional hernia 02/15/2016   Carpal tunnel syndrome (3ry area of Pain) (Bilateral) (L>R) 11/13/2015   Substance abuse ([place]) 09/07/2015   Paresthesia 08/02/2015   Chronic pain syndrome 08/02/2015   Postpartum endometritis 07/21/2015   PTSD (post-traumatic stress disorder) 05/16/2015   Severe episode of recurrent major depressive disorder, without psychotic features ([place]) 05/16/2015   GAD (generalized anxiety disorder) 05/16/2015   Difficult intravenous access 05/08/2015   Right optic neuritis 04/27/2015   Conversion disorder with attacks or seizures, persistent, with psychological stressor 03/28/2015   Migraine with aura and without status migrainosus, not intractable 03/28/2015   Generalized anxiety disorder 03/28/2015   Conversion disorder 03/28/2015   HTN (hypertension) 02/27/2015   Perforated bowel ([place]) 02/27/2015   Depression 12/27/2014   Multiple sclerosis ([place]) 12/27/2014   Migraine 12/27/2014   Seizure disorder ([place]) 12/27/2014   Smoker 12/27/2014   Severe obesity (BMI >= 40) ([place]) 12/27/2014   DDD (degenerative disc disease), cervical 07/28/2014   Major depressive disorder, single episode 11/02/2013   Gastroesophageal reflux disease  10/25/2010   Tonsillitis 10/25/2010   1:59 PM, 07/04/20 M. Sherlyn Lees, PT, DPT Physical Therapist- [place] Office Number: [phone]   [facility] [address] [place], [place], [address] Phone: [phone]   Fax:  [phone]  Name: Emily [name] MRN: [id number] Date of Birth: 01-Feb-1980

## 2020-07-04 NOTE — Patient Instructions (Signed)
Access Code: [email or web address] URL: [email or web address] Date: 07/04/2020 Prepared by: Sherlyn Lees  Exercises Supine Chin Tuck - 1 x daily - 7 x weekly - 3 sets - 10 reps - 3 sec hold Supine March with Alternating Leg Lifts - 1 x daily - 7 x weekly - 3 sets - 10 reps

## 2020-07-05 DIAGNOSIS — F902 Attention-deficit hyperactivity disorder, combined type: Secondary | ICD-10-CM | POA: Diagnosis not present

## 2020-07-05 DIAGNOSIS — F4312 Post-traumatic stress disorder, chronic: Secondary | ICD-10-CM | POA: Diagnosis not present

## 2020-07-05 DIAGNOSIS — F064 Anxiety disorder due to known physiological condition: Secondary | ICD-10-CM | POA: Diagnosis not present

## 2020-07-05 DIAGNOSIS — F3132 Bipolar disorder, current episode depressed, moderate: Secondary | ICD-10-CM | POA: Diagnosis not present

## 2020-07-06 ENCOUNTER — Telehealth: Payer: Self-pay | Admitting: Neurology

## 2020-07-06 ENCOUNTER — Other Ambulatory Visit: Payer: Self-pay

## 2020-07-06 DIAGNOSIS — G35 Multiple sclerosis: Secondary | ICD-10-CM

## 2020-07-06 DIAGNOSIS — E559 Vitamin D deficiency, unspecified: Secondary | ICD-10-CM

## 2020-07-06 NOTE — Addendum Note (Signed)
Addended by: Jake Seats on: 07/06/2020 03:10 PM   Modules accepted: Orders

## 2020-07-06 NOTE — Telephone Encounter (Signed)
Spoke to patient about plan for switching to Ocrevus. She will need a washout period for the Gilenya for 4 weeks before starting Ocrevus. She will need to have CBC with diff, hepatitis B panel, and qualitative immunoglobulin panel (IgG/IgA/IgM) done in preparation for Ocrevus. When she gets the labs, she will stop the Gilenya. We will start process to initiate Ocrevus and scheduled 4 weeks from when she stopped Gilenya. She will need another CBC with diff at week 3 to make sure lymphocyte level is above 0.8 before we start Ocrevus at week 4. Then will check immunoglobulin panel every 6 months when on Ocrevus.  Discussed initial infusion dose of Ocrevus, 300mg  first dose, then 300mg  2 weeks later. After this, she will be getting 600mg  every 6 months. Side effects, including mild (infusion reactions, cold sores, respiratory infections, depression), and rare/serious (increased risk for infection, cancer) were discussed. She expressed understanding and would like to proceed.   Heather, pls let her know where to go for labs (CBC with diff, hepatitis B panel, qualitative immunoglobulin panel) and let's start process for Ocrevus, thanks!

## 2020-07-06 NOTE — Telephone Encounter (Signed)
Pt called and informed about labs she is going to go to the [facility] in [place] near the hospital to have her labs done, Ocrevus start form mailed to pt she is going to fill it out and send it back to [place]

## 2020-07-08 ENCOUNTER — Other Ambulatory Visit: Payer: Self-pay | Admitting: Neurology

## 2020-07-08 DIAGNOSIS — G35 Multiple sclerosis: Secondary | ICD-10-CM

## 2020-07-11 ENCOUNTER — Telehealth: Payer: Medicare Other | Admitting: Neurology

## 2020-07-11 DIAGNOSIS — Z79891 Long term (current) use of opiate analgesic: Secondary | ICD-10-CM | POA: Diagnosis not present

## 2020-07-11 DIAGNOSIS — M792 Neuralgia and neuritis, unspecified: Secondary | ICD-10-CM | POA: Diagnosis not present

## 2020-07-13 DIAGNOSIS — R87612 Low grade squamous intraepithelial lesion on cytologic smear of cervix (LGSIL): Secondary | ICD-10-CM | POA: Diagnosis not present

## 2020-07-14 DIAGNOSIS — F3132 Bipolar disorder, current episode depressed, moderate: Secondary | ICD-10-CM | POA: Diagnosis not present

## 2020-07-14 DIAGNOSIS — F4312 Post-traumatic stress disorder, chronic: Secondary | ICD-10-CM | POA: Diagnosis not present

## 2020-07-14 DIAGNOSIS — F064 Anxiety disorder due to known physiological condition: Secondary | ICD-10-CM | POA: Diagnosis not present

## 2020-07-14 DIAGNOSIS — F902 Attention-deficit hyperactivity disorder, combined type: Secondary | ICD-10-CM | POA: Diagnosis not present

## 2020-07-24 ENCOUNTER — Telehealth: Payer: Self-pay | Admitting: Neurology

## 2020-07-24 ENCOUNTER — Other Ambulatory Visit (INDEPENDENT_AMBULATORY_CARE_PROVIDER_SITE_OTHER): Payer: Self-pay | Admitting: Internal Medicine

## 2020-07-24 DIAGNOSIS — R112 Nausea with vomiting, unspecified: Secondary | ICD-10-CM

## 2020-07-24 DIAGNOSIS — Z23 Encounter for immunization: Secondary | ICD-10-CM | POA: Diagnosis not present

## 2020-07-24 DIAGNOSIS — L600 Ingrowing nail: Secondary | ICD-10-CM | POA: Diagnosis not present

## 2020-07-24 DIAGNOSIS — K219 Gastro-esophageal reflux disease without esophagitis: Secondary | ICD-10-CM

## 2020-07-24 DIAGNOSIS — L740 Miliaria rubra: Secondary | ICD-10-CM | POA: Diagnosis not present

## 2020-07-24 DIAGNOSIS — F319 Bipolar disorder, unspecified: Secondary | ICD-10-CM | POA: Insufficient documentation

## 2020-07-24 NOTE — Telephone Encounter (Signed)
Patient called and said she is still waiting to hear from [facility] to schedule follow up testing. She'd like to touch base with a clinical staff member about this.  2. Dentist wants her to get a new partial. To do that she will need a letter with her diagnosis codes, her medications listed, and signed by the prescribing doctor.  She may need two letters as she also sees Dr. Tomi Likens.

## 2020-07-24 NOTE — Telephone Encounter (Signed)
This is an [facility] patient, will forward the message to them

## 2020-07-25 NOTE — Telephone Encounter (Signed)
Spoke with Emily [name] she is going today to get her labs today if her dad can take her. She was advised that we need the labs so we can order her medication, she said that [facility] in [place] was given her a hard time, pt was advised that she needs to take her paper work and show up and they will do it.  Pt only needs a letter from [name] not [name]

## 2020-07-25 NOTE — Telephone Encounter (Signed)
Pt called back in and stated [facility] told her they won't fax the results unless it says on the order to fax the results.

## 2020-07-25 NOTE — Telephone Encounter (Signed)
Pt called back in and stated [facility] is going to try and find the [place] codes with their customer service. They will call her and let her know if she could get it done or not. They stated [facility] would have the same problem. She wants to find out what to do if they cannot do her blood work today?

## 2020-07-25 NOTE — Telephone Encounter (Signed)
Pt called back in she was not able to get blood work done with [facility]. They told them they couldn't get a hold of Dr. Delice Lesch to get the correct codes. She can try to come get her blood work done the first week of August. She has to get transportation a week in advance. The patient would like to see if [facility] could be called to give the information to them, so she doesn't have to wait that long at [phone].

## 2020-07-25 NOTE — Telephone Encounter (Signed)
Patient called to see if the lab results have been received yet.

## 2020-07-26 ENCOUNTER — Other Ambulatory Visit: Payer: Self-pay

## 2020-07-26 DIAGNOSIS — E559 Vitamin D deficiency, unspecified: Secondary | ICD-10-CM

## 2020-07-26 DIAGNOSIS — G35 Multiple sclerosis: Secondary | ICD-10-CM

## 2020-07-26 NOTE — Telephone Encounter (Signed)
Pt is coming Thursday July 28 to our office to have lab work done.

## 2020-07-26 NOTE — Telephone Encounter (Signed)
[facility] called in, they  need new lab orders. Michela Pitcher [facility]. They need a call back [phone] ask for pam or charlese

## 2020-08-01 ENCOUNTER — Ambulatory Visit (HOSPITAL_COMMUNITY): Payer: Medicare Other

## 2020-08-03 ENCOUNTER — Other Ambulatory Visit (INDEPENDENT_AMBULATORY_CARE_PROVIDER_SITE_OTHER): Payer: Medicare Other

## 2020-08-03 ENCOUNTER — Other Ambulatory Visit: Payer: Self-pay

## 2020-08-03 DIAGNOSIS — G35 Multiple sclerosis: Secondary | ICD-10-CM | POA: Diagnosis not present

## 2020-08-03 DIAGNOSIS — E559 Vitamin D deficiency, unspecified: Secondary | ICD-10-CM | POA: Diagnosis not present

## 2020-08-03 MED ORDER — DRONABINOL 2.5 MG PO CAPS: ORAL_CAPSULE | ORAL | 3 refills | Status: DC

## 2020-08-03 NOTE — Telephone Encounter (Signed)
I printed prescription. Please fax to [facility]. Thanks!

## 2020-08-03 NOTE — Telephone Encounter (Signed)
Faxed Marinol 2.'5mg'$  to [facility]

## 2020-08-03 NOTE — Addendum Note (Signed)
Addended by: Cheron Every on: 08/03/2020 10:43 AM   Modules accepted: Orders

## 2020-08-04 DIAGNOSIS — M5416 Radiculopathy, lumbar region: Secondary | ICD-10-CM | POA: Diagnosis not present

## 2020-08-04 DIAGNOSIS — Z79891 Long term (current) use of opiate analgesic: Secondary | ICD-10-CM | POA: Diagnosis not present

## 2020-08-04 DIAGNOSIS — M545 Low back pain, unspecified: Secondary | ICD-10-CM | POA: Diagnosis not present

## 2020-08-04 DIAGNOSIS — M792 Neuralgia and neuritis, unspecified: Secondary | ICD-10-CM | POA: Diagnosis not present

## 2020-08-04 LAB — IGG, IGA, IGM
IgA/Immunoglobulin A, Serum: 283 mg/dL (ref 87–352)
IgG (Immunoglobin G), Serum: 1005 mg/dL (ref 586–1602)
IgM (Immunoglobulin M), Srm: 89 mg/dL (ref 26–217)

## 2020-08-04 LAB — CBC WITH DIFFERENTIAL/PLATELET
Basophils Absolute: 0 10*3/uL (ref 0.0–0.2)
Basos: 0 %
EOS (ABSOLUTE): 0 10*3/uL (ref 0.0–0.4)
Eos: 0 %
Hematocrit: 45.1 % (ref 34.0–46.6)
Hemoglobin: 15.3 g/dL (ref 11.1–15.9)
Immature Grans (Abs): 0 10*3/uL (ref 0.0–0.1)
Immature Granulocytes: 0 %
Lymphocytes Absolute: 0.7 10*3/uL (ref 0.7–3.1)
Lymphs: 11 %
MCH: 30.1 pg (ref 26.6–33.0)
MCHC: 33.9 g/dL (ref 31.5–35.7)
MCV: 89 fL (ref 79–97)
Monocytes Absolute: 0.4 10*3/uL (ref 0.1–0.9)
Monocytes: 7 %
Neutrophils Absolute: 5.3 10*3/uL (ref 1.4–7.0)
Neutrophils: 82 %
Platelets: 331 10*3/uL (ref 150–450)
RBC: 5.08 x10E6/uL (ref 3.77–5.28)
RDW: 12.3 % (ref 11.7–15.4)
WBC: 6.5 10*3/uL (ref 3.4–10.8)

## 2020-08-04 LAB — VITAMIN D 25 HYDROXY (VIT D DEFICIENCY, FRACTURES): Vit D, 25-Hydroxy: 33.6 ng/mL (ref 30.0–100.0)

## 2020-08-04 LAB — HEPATITIS B SURFACE ANTIBODY,QUALITATIVE: Hep B Surface Ab, Qual: NONREACTIVE

## 2020-08-04 LAB — HEPATITIS B CORE ANTIBODY, TOTAL: Hep B Core Total Ab: NEGATIVE

## 2020-08-04 NOTE — Progress Notes (Signed)
Pt advised of gilenya to stop and ocrevus in 4 weeks.

## 2020-08-07 ENCOUNTER — Telehealth: Payer: Self-pay | Admitting: Neurology

## 2020-08-07 NOTE — Telephone Encounter (Signed)
Patient called with concerns she stopped Gilenya and today she is having some pain and spasms in her hands and feet. It is burning and stabbing in her arms and legs as well.  It has been 3 days since she stopped this medication. She'd like some suggestions.   She is not looking for pain medicine, she said.

## 2020-08-08 ENCOUNTER — Ambulatory Visit (HOSPITAL_COMMUNITY): Payer: Medicare Other | Attending: Orthopaedic Surgery

## 2020-08-08 ENCOUNTER — Encounter (HOSPITAL_COMMUNITY): Payer: Self-pay

## 2020-08-08 ENCOUNTER — Other Ambulatory Visit: Payer: Self-pay

## 2020-08-08 DIAGNOSIS — G8929 Other chronic pain: Secondary | ICD-10-CM

## 2020-08-08 DIAGNOSIS — M6281 Muscle weakness (generalized): Secondary | ICD-10-CM | POA: Diagnosis not present

## 2020-08-08 DIAGNOSIS — M5442 Lumbago with sciatica, left side: Secondary | ICD-10-CM | POA: Diagnosis not present

## 2020-08-08 DIAGNOSIS — F902 Attention-deficit hyperactivity disorder, combined type: Secondary | ICD-10-CM | POA: Diagnosis not present

## 2020-08-08 DIAGNOSIS — F064 Anxiety disorder due to known physiological condition: Secondary | ICD-10-CM | POA: Diagnosis not present

## 2020-08-08 DIAGNOSIS — F4312 Post-traumatic stress disorder, chronic: Secondary | ICD-10-CM | POA: Diagnosis not present

## 2020-08-08 DIAGNOSIS — F3132 Bipolar disorder, current episode depressed, moderate: Secondary | ICD-10-CM | POA: Diagnosis not present

## 2020-08-08 DIAGNOSIS — M542 Cervicalgia: Secondary | ICD-10-CM

## 2020-08-08 NOTE — Telephone Encounter (Signed)
Pt called no answer left a voice mail for pt to call the office back  

## 2020-08-08 NOTE — Therapy (Signed)
Emily [name] [address] [place], [place], [address] Phone: [phone]   Fax:  [phone]  Physical Therapy Treatment  Patient Details  Name: Emily [name] MRN: [id number] Date of Birth: 1980-10-10 Referring Provider (PT): Rodell Perna   Encounter Date: 08/08/2020   PT End of Session - 08/08/20 1320     Visit Number 7    Number of Visits 8    Date for PT Re-Evaluation 08/29/20    Authorization Type medicare primary and secondary medicaid    Progress Note Due on Visit 2    PT Start Time 1309    PT Stop Time [id number]   last 5 min on Nustep, not included with charges   PT Time Calculation (min) 49 min    Activity Tolerance Patient tolerated treatment well    Behavior During Therapy [facility] for tasks assessed/performed             Past Medical History:  Diagnosis Date   ADHD    Anxiety    Asthma as a child   Chronic back pain    Chronic pain    Conversion disorder    DDD (degenerative disc disease), cervical    Depression    External hemorrhoid    Fatty liver    Fibromyalgia    GERD (gastroesophageal reflux disease)    History of kidney stones    passed stones   HTN (hypertension) 02/27/2015   JC virus antibody positive    Migraines    MS (multiple sclerosis) ([place]) 06/2006   Neuromuscular disorder ([place])    MS   Neuropathy    OA (osteoarthritis)    Ovarian cyst    Panic attack    Perforated bowel ([place]) 2009   colostomy bag for 3 motnhs   Pneumonia    Pseudoseizures ([facility])    last seizure was in 08/2018    PTSD (post-traumatic stress disorder)    PTSD (post-traumatic stress disorder)    S/P emergency C-section    Seasonal allergies    Urinary urgency    Vertigo    Wears contact lenses    Wears glasses     Past Surgical History:  Procedure Laterality Date   ABDOMINAL SURGERY     ABDOMINAL WOUND EXPLORATION   03/19/2019   Abdominal Wound Exploration with Explantation of Mesh   APPENDECTOMY     BOWEL RESECTION   01/2007   with colostomy   BOWEL RESECTION N/A 06/14/2019   Procedure: SMALL BOWEL RESECTION;  Surgeon: Ralene Ok, MD;  Location: [facility];  Service: General;  Laterality: N/A;   CESAREAN SECTION N/A 07/12/2015   Procedure: CESAREAN SECTION;  Surgeon: Guss Bunde, MD;  Location: [place];  Service: Obstetrics;  Laterality: N/A;   COLON SURGERY     part of colon and intestines removed; colostomy 2008   COLONOSCOPY WITH PROPOFOL N/A 01/29/2017   Procedure: COLONOSCOPY WITH PROPOFOL;  Surgeon: Ileana Roup, MD;  Location: [facility];  Service: General;  Laterality: N/A;   COLOSTOMY CLOSURE  04/2007   EVALUATION UNDER ANESTHESIA WITH HEMORRHOIDECTOMY N/A 06/11/2018   Procedure: ANORECTAL EXAM UNDER ANESTHESIA WITH HEMORRHOIDECTOMY;  Surgeon: Ileana Roup, MD;  Location: [facility];  Service: General;  Laterality: N/A;   EXCISIONAL HEMORRHOIDECTOMY     EXTREMITY CYST EXCISION  1994   right leg   HERNIA REPAIR     INCISIONAL HERNIA REPAIR N/A 02/16/2016   Procedure: HERNIA REPAIR INCISIONAL;  Surgeon: Fanny Skates,  MD;  Location: [facility];  Service: General;  Laterality: N/A;   INSERTION OF MESH  02/16/2016   Procedure: INSERTION OF MESH;  Surgeon: Fanny Skates, MD;  Location: [facility];  Service: General;;   LAPAROSCOPY N/A 06/14/2019   Procedure: ATTEMPTED DIAGNOSTIC LAPAROSCOPY;  Surgeon: Ralene Ok, MD;  Location: [facility];  Service: General;  Laterality: N/A;   LAPAROTOMY  03/19/2019   Procedure: Abdominal Wound Exploration with Explantation of Mesh;  Surgeon: Ralene Ok, MD;  Location: [place];  Service: General;;   LAPAROTOMY N/A 06/14/2019   Procedure: EXPLORATORY LAPAROTOMY;  Surgeon: Ralene Ok, MD;  Location: [facility];  Service: General;  Laterality: N/A;   LYSIS OF ADHESION N/A 06/14/2019   Procedure: LYSIS OF ADHESIONS;  Surgeon: Ralene Ok, MD;  Location: [facility];  Service: General;  Laterality: N/A;   RADIOLOGY WITH ANESTHESIA N/A  03/06/2017   Procedure: MRI WITH ANESTHESIA OF CERVICAL SPINE WITHOUT CONTRAST, MRI OF LUMBAR SPINE WITHOUT CONTRAST;  Surgeon: Radiologist, Medication, MD;  Location: [facility];  Service: Radiology;  Laterality: N/A;   RADIOLOGY WITH ANESTHESIA N/A 09/15/2018   Procedure: MRI OF BRAIN WITH AND WITHOUT CONTRAST;  Surgeon: Radiologist, Medication, MD;  Location: [facility];  Service: Radiology;  Laterality: N/A;   RADIOLOGY WITH ANESTHESIA N/A 10/13/2018   Procedure: MRI OF BRAIN WITH AND WITHOUT CONTRAST;  Surgeon: Radiologist, Medication, MD;  Location: [facility];  Service: Radiology;  Laterality: N/A;   RADIOLOGY WITH ANESTHESIA N/A 10/26/2018   Procedure: MRI UNDER ANESTHESIA; HEAD, CERVICAL AND THORASCIC SPINE;  Surgeon: Radiologist, Medication, MD;  Location: [facility];  Service: Radiology;  Laterality: N/A;   RADIOLOGY WITH ANESTHESIA N/A 06/08/2020   Procedure: MRI WITH ANESTHESIA BRAIN WITH AND WITHOUT CONTRAST.,  CERVICAL BRAIN WITH AND WITHOUT CONTRAST, THORACIC SPINE WITH AND WITHOUT;  Surgeon: Radiologist, Medication, MD;  Location: [facility];  Service: Radiology;  Laterality: N/A;   SCAR REVISION  01/21/2011   Procedure: SCAR REVISION;  Surgeon: Hermelinda Dellen;  Location: [facility];  Service: Plastics;  Laterality: N/A;  exploration of scar of abdomen and repair of defect   TOOTH EXTRACTION Left 10/2016   TRANSRECTAL DRAINAGE OF PELVIC ABSCESS      There were no vitals filed for this visit.   Subjective Assessment - 08/08/20 1316     Subjective Been seeing spinal specialist, plan to receive technique called "sprint" to desenitize nerve symptoms.    Pertinent History anxiety, MS, fibromyalgia    Patient Stated Goals to be able to move around a little bit better, being able to bend over to change daughter's diaper. Being able to bend neck to her chest without severe pain    Currently in Pain? Yes    Pain Score 7     Pain Location Back    Pain Orientation Lower    Pain Descriptors /  Indicators Aching;Throbbing    Pain Radiating Towards goes down left back of leg    Pain Onset More than a month ago    Pain Frequency Constant    Aggravating Factors  bending, lifting    Pain Relieving Factors sitting, rest                               [facility] Adult PT Treatment/Exercise - 08/08/20 0001       Posture/Postural Control   Posture/Postural Control Postural limitations    Postural Limitations Rounded Shoulders;Forward head  Exercises   Exercises Lumbar      Neck Exercises: Machines for Strengthening   Nustep L2 x5 min at EOS      Neck Exercises: Seated   Postural Training Educated importance of proper posture to reduce strain on neck mm    Other Seated Exercise 3D thoracic excursion      Lumbar Exercises: Stretches   Other Lumbar Stretch Exercise hip excursions 10X each      Lumbar Exercises: Standing   Other Standing Lumbar Exercises Deep breathing 10x      Lumbar Exercises: Seated   Other Seated Lumbar Exercises 3D thoracic excursion 5x each    Other Seated Lumbar Exercises Educated importance of proper posture to reduce strain on neck mm      Lumbar Exercises: Supine   Dead Bug 15 reps;5 seconds    Dead Bug Limitations alternating UE and knee for tactile cue/target    Bridge 10 reps;5 seconds   2 sets, partial range, 5" hold time   Other Supine Lumbar Exercises decompression 2-5 5x 5" holds                      PT Short Term Goals - 07/04/20 1311       PT SHORT TERM GOAL #1   Title Patient will be able to ambulate at least 2 minutes without increase in low back pain to improve overall function    Baseline pt reports 2-5 min without increase in pain    Time 4    Period Weeks    Status Achieved    Target Date 06/06/20      PT SHORT TERM GOAL #2   Title Patient will be able to demonstrate at least 45 degrees of cervical rotation in both directions to demonstrate improved neck mobilty    Baseline 45 degrees right  rotation; 35 degrees left rotation    Time 4    Period Weeks    Status On-going    Target Date 07/18/20      PT SHORT TERM GOAL #3   Title Patient will be independent in self management strategies to improve quality of life and functional outcomes.    Baseline cues for body mechanics and c-spine postural alignment    Time 4    Period Weeks    Status On-going    Target Date 07/18/20               PT Long Term Goals - 07/04/20 1308       PT LONG TERM GOAL #1   Title Patient will report at least 50% improvement in overall symptoms and/or function to demonstrate improved functional mobility    Baseline 10%    Time 8    Period Weeks    Status On-going    Target Date 08/29/20      PT LONG TERM GOAL #2   Title Patient will be able to bend forward with good mechanics to change her daughter's diaper to reduce stress on her lumbar spine.    Baseline cues for squat-lift form    Time 8    Period Weeks    Status On-going    Target Date 08/29/20      PT LONG TERM GOAL #3   Title Patient will improve on back FOTO score to meet predicted outcomes to demonstrate improved functional mobility.    Baseline 51.8% function    Time 8    Period Weeks    Status On-going  Target Date 08/29/20                   Plan - 08/08/20 1337     Clinical Impression Statement Pt educated on importance of posture to reduce strain on posterior cervical mm.  Session focus on improving spinal mobility and core/proximal strengthening.   Added 3D excursions for spinal mobility, decompression exercise for posterior chain strengthening and clams for specific gluteal strengthening.  Pt tolerated well to session with reports of decreased radicular symptoms and no increased pain.  Discussed likelihood of increased soreness following session as pt has not been to therapy in over a month.    Personal Factors and Comorbidities Comorbidity 1;Comorbidity 2;Comorbidity 3+;Fitness    Comorbidities 4  abdominal surgeries (history, fibromyalgia, anxiety, MS,    Examination-Activity Limitations Bed Mobility;Bend;Caring for Others;Carry;Dressing;Lift;Locomotion Level;Reach Overhead;Transfers;Stand;Squat    Examination-Participation Restrictions Driving;Cleaning;Meal Prep    Stability/Clinical Decision Making Evolving/Moderate complexity    Clinical Decision Making Moderate    Rehab Potential Good    PT Frequency 2x / week    PT Duration 6 weeks    PT Treatment/Interventions ADLs/Self Care Home Management;Aquatic Therapy;Biofeedback;Cryotherapy;Traction;Moist Heat;Gait training;Electrical Stimulation;Therapeutic exercise;Therapeutic activities;Manual techniques;Patient/family education;Neuromuscular re-education;Joint Manipulations    PT Next Visit Plan LE and core strengthening, cardio, STM for pain management as tolerated.  Progress to standing core strengthening exercises    PT Home Exercise Plan sit to stands, BUE shoulder extension, high row, ER with red t-band; 8/2: decompression    Consulted and Agree with Plan of Care Patient             Patient will benefit from skilled therapeutic intervention in order to improve the following deficits and impairments:  Decreased range of motion, Decreased activity tolerance, Pain, Decreased endurance, Decreased strength, Difficulty walking, Decreased mobility, Decreased balance  Visit Diagnosis: Muscle weakness (generalized)  Cervicalgia  Chronic midline low back pain with left-sided sciatica     Problem List Patient Active Problem List   Diagnosis Date Noted   Nausea without vomiting 05/30/2020   Abnormal MRI, lumbar spine (03/06/2017) 04/13/2020   Chronic low back pain (Bilateral) w/o sciatica 04/04/2020   Conversion disorder with seizures or convulsions 12/06/2019   Arthropathy of left knee 12/01/2019   Osteoarthritis of knees (Bilateral) 11/02/2019   Anxiety due to invasive procedure 11/02/2019   Unstable knee, left 10/25/2019    S/P small bowel resection 06/14/2019   Infected hernioplasty mesh ([place]) 03/19/2019   Pseudoseizures ([place]) 02/22/2019   ADHD 02/22/2019   Anxiety 02/22/2019   Other chronic pain 01/08/2019   Other cervical disc degeneration, unspecified cervical region 01/08/2019   Personal history of nicotine dependence 01/08/2019   S/P emergency C-section    MS (multiple sclerosis) ([facility])    Chronic pain    Panic attack    Open wound of abdominal wall, anterior, complicated, sequela [id number]   Postoperative wound infection 09/20/2018   History of substance use disorder 07/19/2018   Chronic nausea 04/13/2018   Nausea with vomiting 03/23/2018   Pharmacologic therapy 03/16/2018   Disorder of skeletal system 03/16/2018   Problems influencing health status 03/16/2018   Abnormal MRI, cervical spine (03/06/17) 03/16/2018   Morbid obesity with BMI of 45.0-49.9, adult ([place]) 03/16/2018   Chronic knee pain (Bilateral) 02/18/2018   Cervical facet hypertrophy 04/29/2017   Cervical facet syndrome (Bilateral) (R>L) 04/29/2017   Spondylosis without myelopathy or radiculopathy, cervical region 04/29/2017   Spondylosis without myelopathy or radiculopathy, lumbosacral region 03/04/2017   Cervicogenic headache 02/10/2017  Occipital headache 02/10/2017   Trigger point with back pain (Left) 02/10/2017   History of fainting (vasovagal) 02/10/2017   History of vasovagal syncope 02/10/2017   Neurogenic pain 01/23/2017   Chronic musculoskeletal pain 12/02/2016   Cervical radiculitis (Bilateral) 11/12/2016   Chronic upper back pain (1ry area of Pain) (Bilateral) (R>L) 11/01/2016   Chronic hand pain (3ry area of Pain) (Bilateral) (L>R) 11/01/2016   Chronic wrist pain (3ry area of Pain) (Bilateral) (L>R) 11/01/2016   Chronic foot/toes pain (4th area of Pain) (Bilateral) (R>L) 11/01/2016   Chronic upper extremity pain (2ry area of Pain) (Bilateral) (L>R) 11/01/2016   Long term prescription benzodiazepine use  11/01/2016   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) 11/01/2016   Cervical foraminal stenosis (Bilateral) (C6-7) 11/01/2016   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   DDD (degenerative disc disease), thoracic 11/01/2016   DDD (degenerative disc disease), lumbar 11/01/2016   Lumbar facet arthropathy 11/01/2016   Lumbar facet syndrome (Bilateral) (R>L) 11/01/2016   Vitamin D deficiency 10/28/2016   Opiate use 07/23/2016   Chronic neck pain (1ry area of Pain) (Bilateral) (R>L) 07/23/2016   Chronic low back pain (2ry area of Pain) (Bilateral) (L>R) w/ sciatica (Bilateral) 07/23/2016   Chronic knee pain (5th area of Pain) (Left) 07/23/2016   Midline thoracic back pain 07/23/2016   Chronic lower extremity pain (Bilateral) 07/23/2016   Incarcerated incisional hernia 02/16/2016   Incisional hernia 02/15/2016   Carpal tunnel syndrome (3ry area of Pain) (Bilateral) (L>R) 11/13/2015   Substance abuse ([place]) 09/07/2015   Paresthesia 08/02/2015   Chronic pain syndrome 08/02/2015   Postpartum endometritis 07/21/2015   PTSD (post-traumatic stress disorder) 05/16/2015   Severe episode of recurrent major depressive disorder, without psychotic features ([place]) 05/16/2015   GAD (generalized anxiety disorder) 05/16/2015   Difficult intravenous access 05/08/2015   Right optic neuritis 04/27/2015   Conversion disorder with attacks or seizures, persistent, with psychological stressor 03/28/2015   Migraine with aura and without status migrainosus, not intractable 03/28/2015   Generalized anxiety disorder 03/28/2015   Conversion disorder 03/28/2015   HTN (hypertension) 02/27/2015   Perforated bowel ([place]) 02/27/2015   Depression 12/27/2014   Multiple sclerosis ([place]) 12/27/2014   Migraine 12/27/2014   Seizure disorder ([place]) 12/27/2014   Smoker 12/27/2014   Severe obesity (BMI >= 40) ([place]) 12/27/2014   DDD (degenerative disc disease), cervical 07/28/2014   Major depressive disorder,  single episode 11/02/2013   Gastroesophageal reflux disease 10/25/2010   Tonsillitis 10/25/2010   Ihor Austin, LPTA/CLT; CBIS [phone]  Aldona Lento 08/08/2020, 1:58 PM  [facility] [address] [place], [place], [address] Phone: [phone]   Fax:  [phone]  Name: INFANT COTLER MRN: [id number] Date of Birth: Jun 15, 1980

## 2020-08-08 NOTE — Telephone Encounter (Signed)
She has tried all the medications for muscle spasms. For the burning and stabbing, she can ask her Pain Doctor if there is room to increase the Lyrica for now. Thanks

## 2020-08-09 NOTE — Telephone Encounter (Signed)
Pt called no answer left a voice mail to call the office back  °

## 2020-08-10 ENCOUNTER — Telehealth: Payer: Self-pay

## 2020-08-10 NOTE — Telephone Encounter (Signed)
Pt is going to talk to her pain dr about increase in lyrica and she was started on hydrocodone also going to get an epidural in her back to help with pain,

## 2020-08-10 NOTE — Telephone Encounter (Signed)
Pt called 3 times no answer closing out this phone note. If she calls back will let her know Per [name] She has tried all the medications for muscle spasms. For the burning and stabbing, she can ask her Pain Doctor if there is room to increase the Lyrica for now while between MS drugs,

## 2020-08-14 ENCOUNTER — Telehealth: Payer: Self-pay | Admitting: Neurology

## 2020-08-14 ENCOUNTER — Encounter: Payer: Self-pay | Admitting: Neurology

## 2020-08-14 NOTE — Telephone Encounter (Signed)
Pt called in wanting to find out if a letter of her medications and diagnosis signed by Dr. Delice Lesch is ready? She had originally called in about it on 07/24/20 and it is documented in the telephone note. She said she would like it mailed to her.

## 2020-08-15 ENCOUNTER — Other Ambulatory Visit: Payer: Self-pay

## 2020-08-15 ENCOUNTER — Ambulatory Visit (HOSPITAL_COMMUNITY): Payer: Medicare Other

## 2020-08-15 DIAGNOSIS — M542 Cervicalgia: Secondary | ICD-10-CM | POA: Diagnosis not present

## 2020-08-15 DIAGNOSIS — M6281 Muscle weakness (generalized): Secondary | ICD-10-CM | POA: Diagnosis not present

## 2020-08-15 DIAGNOSIS — G8929 Other chronic pain: Secondary | ICD-10-CM

## 2020-08-15 DIAGNOSIS — M5442 Lumbago with sciatica, left side: Secondary | ICD-10-CM | POA: Diagnosis not present

## 2020-08-15 NOTE — Therapy (Signed)
[facility] [place], [place], [address] Phone: [phone]   Fax:  [phone]  Physical Therapy Treatment and D/C Summary  Patient Details  Name: Emily [name] MRN: [id number] Date of Birth: Jun 13, 1980 Referring Provider (PT): Rodell Perna  PHYSICAL THERAPY DISCHARGE SUMMARY  Visits from Start of Care: 8  Current functional level related to goals / functional outcomes: Pt did not demonstrate significant improvements with therapy POC. Demonstrates improved LE strength but no change to function or pain symptoms   Remaining deficits: Continued body mechanics deficits and pain limitation   Education / Equipment: HEP   Patient agrees to discharge. Patient goals were partially met. Patient is being discharged due to lack of progress.  Encounter Date: 08/15/2020   PT End of Session - 08/15/20 1303     Visit Number 8    Number of Visits 8    Date for PT Re-Evaluation 08/29/20    Authorization Type medicare primary and secondary medicaid    Progress Note Due on Visit 16    PT Start Time 1300    PT Stop Time 8546    PT Time Calculation (min) 45 min    Activity Tolerance Patient tolerated treatment well    Behavior During Therapy WFL for tasks assessed/performed             Past Medical History:  Diagnosis Date   ADHD    Anxiety    Asthma as a child   Chronic back pain    Chronic pain    Conversion disorder    DDD (degenerative disc disease), cervical    Depression    External hemorrhoid    Fatty liver    Fibromyalgia    GERD (gastroesophageal reflux disease)    History of kidney stones    passed stones   HTN (hypertension) 02/27/2015   JC virus antibody positive    Migraines    MS (multiple sclerosis) ([place]) 06/2006   Neuromuscular disorder (HCC)    MS   Neuropathy    OA (osteoarthritis)    Ovarian cyst    Panic attack    Perforated bowel ([place]) 2009   colostomy bag for 3 motnhs   Pneumonia     Pseudoseizures ([place])    last seizure was in 08/2018    PTSD (post-traumatic stress disorder)    PTSD (post-traumatic stress disorder)    S/P emergency C-section    Seasonal allergies    Urinary urgency    Vertigo    Wears contact lenses    Wears glasses     Past Surgical History:  Procedure Laterality Date   ABDOMINAL SURGERY     ABDOMINAL WOUND EXPLORATION   03/19/2019   Abdominal Wound Exploration with Explantation of Mesh   APPENDECTOMY     BOWEL RESECTION  01/2007   with colostomy   BOWEL RESECTION N/A 06/14/2019   Procedure: SMALL BOWEL RESECTION;  Surgeon: Ralene Ok, MD;  Location: [facility];  Service: General;  Laterality: N/A;   CESAREAN SECTION N/A 07/12/2015   Procedure: CESAREAN SECTION;  Surgeon: Guss Bunde, MD;  Location: [facility];  Service: Obstetrics;  Laterality: N/A;   COLON SURGERY     part of colon and intestines removed; colostomy 2008   COLONOSCOPY WITH PROPOFOL N/A 01/29/2017   Procedure: COLONOSCOPY WITH PROPOFOL;  Surgeon: Ileana Roup, MD;  [facility];  Service: General;  Laterality: N/A;   COLOSTOMY CLOSURE  04/2007   EVALUATION UNDER  ANESTHESIA WITH HEMORRHOIDECTOMY N/A 06/11/2018   Procedure: ANORECTAL EXAM UNDER ANESTHESIA WITH HEMORRHOIDECTOMY;  Surgeon: Ileana Roup, MD;  Location: [facility];  Service: General;  Laterality: N/A;   EXCISIONAL HEMORRHOIDECTOMY     EXTREMITY CYST EXCISION  1994   right leg   HERNIA REPAIR     INCISIONAL HERNIA REPAIR N/A 02/16/2016   Procedure: HERNIA REPAIR INCISIONAL;  Surgeon: Fanny Skates, MD;  [facility];  Service: General;  Laterality: N/A;   INSERTION OF MESH  02/16/2016   Procedure: INSERTION OF MESH;  Surgeon: Fanny Skates, MD;  [facility];  Service: General;;   LAPAROSCOPY N/A 06/14/2019   Procedure: ATTEMPTED DIAGNOSTIC LAPAROSCOPY;  Surgeon: Ralene Ok, MD;  Location: [facility];  Service: General;  Laterality: N/A;   LAPAROTOMY  03/19/2019    Procedure: Abdominal Wound Exploration with Explantation of Mesh;  Surgeon: Ralene Ok, MD;  Location: [facility] [place];  Service: General;;   LAPAROTOMY N/A 06/14/2019   Procedure: EXPLORATORY LAPAROTOMY;  Surgeon: Ralene Ok, MD;  Location: [facility];  Service: General;  Laterality: N/A;   LYSIS OF ADHESION N/A 06/14/2019   Procedure: LYSIS OF ADHESIONS;  Surgeon: Ralene Ok, MD;  Location: [facility];  Service: General;  Laterality: N/A;   RADIOLOGY WITH ANESTHESIA N/A 03/06/2017   Procedure: MRI WITH ANESTHESIA OF CERVICAL SPINE WITHOUT CONTRAST, MRI OF LUMBAR SPINE WITHOUT CONTRAST;  Surgeon: Radiologist, Medication, MD;  Location: [facility];  Service: Radiology;  Laterality: N/A;   RADIOLOGY WITH ANESTHESIA N/A 09/15/2018   Procedure: MRI OF BRAIN WITH AND WITHOUT CONTRAST;  Surgeon: Radiologist, Medication, MD;  Location: [facility];  Service: Radiology;  Laterality: N/A;   RADIOLOGY WITH ANESTHESIA N/A 10/13/2018   Procedure: MRI OF BRAIN WITH AND WITHOUT CONTRAST;  Surgeon: Radiologist, Medication, MD;  Location: [facility];  Service: Radiology;  Laterality: N/A;   RADIOLOGY WITH ANESTHESIA N/A 10/26/2018   Procedure: MRI UNDER ANESTHESIA; HEAD, CERVICAL AND THORASCIC SPINE;  Surgeon: Radiologist, Medication, MD;  Location: [facility];  Service: Radiology;  Laterality: N/A;   RADIOLOGY WITH ANESTHESIA N/A 06/08/2020   Procedure: MRI WITH ANESTHESIA BRAIN WITH AND WITHOUT CONTRAST.,  CERVICAL BRAIN WITH AND WITHOUT CONTRAST, THORACIC SPINE WITH AND WITHOUT;  Surgeon: Radiologist, Medication, MD;  Location: [facility];  Service: Radiology;  Laterality: N/A;   SCAR REVISION  01/21/2011   Procedure: SCAR REVISION;  Surgeon: Hermelinda Dellen;  Location: [facility];  Service: Plastics;  Laterality: N/A;  exploration of scar of abdomen and repair of defect   TOOTH EXTRACTION Left 10/2016   TRANSRECTAL DRAINAGE OF PELVIC ABSCESS      There were no vitals filed for this visit.   Subjective Assessment -  08/15/20 1310     Subjective Been seeing spinal specialist, plan to receive technique called "sprint" to desenitize nerve symptoms. Pt notes she recently had additional MS lesions airse and has noted increase in spasticity, feeling off balance, and cramping.  Pt reports they have discontinued her current MS medication and she will be off of current regimen for a month before starting new regimen    Pertinent History anxiety, MS, fibromyalgia    Patient Stated Goals to be able to move around a little bit better, being able to bend over to change daughter's diaper. Being able to bend neck to her chest without severe pain    Currently in Pain? Yes    Pain Score 7     Pain Location Generalized    Pain  Orientation Upper;Lower    Pain Descriptors / Indicators Aching    Pain Type Chronic pain    Pain Onset More than a month ago    Pain Location Neck    Pain Orientation Left    Pain Descriptors / Indicators Aching    Pain Type Chronic pain                [facility] PT Assessment - 08/15/20 0001       Assessment   Medical Diagnosis neck and low back pain    Referring Provider (PT) Rodell Perna    Next MD Visit as needed      Observation/Other Assessments   Focus on Therapeutic Outcomes (FOTO)  33% function      AROM   Cervical Flexion WNL    Cervical Extension 10% limited    Cervical - Right Rotation 45 degrees    Cervical - Left Rotation 35 degrees    Lumbar Flexion 10% limited    Lumbar Extension 10% limited      Strength   Right Hip Flexion 5/5    Left Hip Flexion 4/5                           [facility] Adult PT Treatment/Exercise - 08/15/20 0001       Lumbar Exercises: Supine   Ab Set 10 reps    Dead Bug 10 reps    Bridge 10 reps    Straight Leg Raise 10 reps      Lumbar Exercises: Sidelying   Clam 10 reps                    PT Education - 08/15/20 1342     Education Details discussion regarding continued exercise regimen until time of back  procedure. Discussed interval walking program on treadmill at her home    Person(s) Educated Patient    Methods Explanation    Comprehension Verbalized understanding              PT Short Term Goals - 08/15/20 1334       PT SHORT TERM GOAL #1   Title Patient will be able to ambulate at least 2 minutes without increase in low back pain to improve overall function    Baseline pt reports 2-5 min without increase in pain    Time 4    Period Weeks    Status Achieved    Target Date 06/06/20      PT SHORT TERM GOAL #2   Title Patient will be able to demonstrate at least 45 degrees of cervical rotation in both directions to demonstrate improved neck mobilty    Baseline 45 degrees right rotation; 35 degrees left rotation    Time 4    Period Weeks    Status Not Met    Target Date 07/18/20      PT SHORT TERM GOAL #3   Title Patient will be independent in self management strategies to improve quality of life and functional outcomes.    Baseline cues for body mechanics and c-spine postural alignment    Time 4    Period Weeks    Status Achieved    Target Date 07/18/20               PT Long Term Goals - 08/15/20 1334       PT LONG TERM GOAL #1   Title Patient will report at least 50%  improvement in overall symptoms and/or function to demonstrate improved functional mobility    Baseline 10%    Time 8    Period Weeks    Status Not Met      PT LONG TERM GOAL #2   Title Patient will be able to bend forward with good mechanics to change her daughter's diaper to reduce stress on her lumbar spine.    Baseline cues for squat-lift form    Time 8    Period Weeks    Status Not Met      PT LONG TERM GOAL #3   Title Patient will improve on back FOTO score to meet predicted outcomes to demonstrate improved functional mobility.    Baseline 51.8% function previously; 33% CLOF which pt attributes to recent MS flare-up issues    Time 8    Period Weeks    Status Not Met                    Plan - 08/15/20 1343     Clinical Impression Statement Pt unfortunately did not experience much pain relief with exercise and intervention but notes she does feel stronger and attributes some of her worsening symptoms due to MS flare-up. Pt has several procedures coming up to address chronic pain which will hopfeully alleviate symptoms and at that time will resume PT services    Personal Factors and Comorbidities Comorbidity 1;Comorbidity 2;Comorbidity 3+;Fitness    Comorbidities 4 abdominal surgeries (history, fibromyalgia, anxiety, MS,    Examination-Activity Limitations Bed Mobility;Bend;Caring for Others;Carry;Dressing;Lift;Locomotion Level;Reach Overhead;Transfers;Stand;Squat    Examination-Participation Restrictions Driving;Cleaning;Meal Prep    Stability/Clinical Decision Making Evolving/Moderate complexity    Rehab Potential Good    PT Frequency 2x / week    PT Duration 6 weeks    PT Treatment/Interventions ADLs/Self Care Home Management;Aquatic Therapy;Biofeedback;Cryotherapy;Traction;Moist Heat;Gait training;Electrical Stimulation;Therapeutic exercise;Therapeutic activities;Manual techniques;Patient/family education;Neuromuscular re-education;Joint Manipulations    PT Next Visit Plan LE and core strengthening, cardio, STM for pain management as tolerated.  Progress to standing core strengthening exercises    PT Home Exercise Plan sit to stands, BUE shoulder extension, high row, ER with red t-band; 8/2: decompression    Consulted and Agree with Plan of Care Patient             Patient will benefit from skilled therapeutic intervention in order to improve the following deficits and impairments:  Decreased range of motion, Decreased activity tolerance, Pain, Decreased endurance, Decreased strength, Difficulty walking, Decreased mobility, Decreased balance  Visit Diagnosis: Muscle weakness (generalized)  Cervicalgia  Chronic midline low back pain with left-sided  sciatica     Problem List Patient Active Problem List   Diagnosis Date Noted   Nausea without vomiting 05/30/2020   Abnormal MRI, lumbar spine (03/06/2017) 04/13/2020   Chronic low back pain (Bilateral) w/o sciatica 04/04/2020   Conversion disorder with seizures or convulsions 12/06/2019   Arthropathy of left knee 12/01/2019   Osteoarthritis of knees (Bilateral) 11/02/2019   Anxiety due to invasive procedure 11/02/2019   Unstable knee, left 10/25/2019   S/P small bowel resection 06/14/2019   Infected hernioplasty mesh ([place]) 03/19/2019   Pseudoseizures ([place]) 02/22/2019   ADHD 02/22/2019   Anxiety 02/22/2019   Other chronic pain 01/08/2019   Other cervical disc degeneration, unspecified cervical region 01/08/2019   Personal history of nicotine dependence 01/08/2019   S/P emergency C-section    MS (multiple sclerosis) (HCC)    Chronic pain    Panic attack    Open wound of  abdominal wall, anterior, complicated, sequela 81/27/5170   Postoperative wound infection 09/20/2018   History of substance use disorder 07/19/2018   Chronic nausea 04/13/2018   Nausea with vomiting 03/23/2018   Pharmacologic therapy 03/16/2018   Disorder of skeletal system 03/16/2018   Problems influencing health status 03/16/2018   Abnormal MRI, cervical spine (03/06/17) 03/16/2018   Morbid obesity with BMI of 45.0-49.9, adult ([place]) 03/16/2018   Chronic knee pain (Bilateral) 02/18/2018   Cervical facet hypertrophy 04/29/2017   Cervical facet syndrome (Bilateral) (R>L) 04/29/2017   Spondylosis without myelopathy or radiculopathy, cervical region 04/29/2017   Spondylosis without myelopathy or radiculopathy, lumbosacral region 03/04/2017   Cervicogenic headache 02/10/2017   Occipital headache 02/10/2017   Trigger point with back pain (Left) 02/10/2017   History of fainting (vasovagal) 02/10/2017   History of vasovagal syncope 02/10/2017   Neurogenic pain 01/23/2017   Chronic musculoskeletal pain  12/02/2016   Cervical radiculitis (Bilateral) 11/12/2016   Chronic upper back pain (1ry area of Pain) (Bilateral) (R>L) 11/01/2016   Chronic hand pain (3ry area of Pain) (Bilateral) (L>R) 11/01/2016   Chronic wrist pain (3ry area of Pain) (Bilateral) (L>R) 11/01/2016   Chronic foot/toes pain (4th area of Pain) (Bilateral) (R>L) 11/01/2016   Chronic upper extremity pain (2ry area of Pain) (Bilateral) (L>R) 11/01/2016   Long term prescription benzodiazepine use 11/01/2016   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) 11/01/2016   Cervical foraminal stenosis (Bilateral) (C6-7) 11/01/2016   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   DDD (degenerative disc disease), thoracic 11/01/2016   DDD (degenerative disc disease), lumbar 11/01/2016   Lumbar facet arthropathy 11/01/2016   Lumbar facet syndrome (Bilateral) (R>L) 11/01/2016   Vitamin D deficiency 10/28/2016   Opiate use 07/23/2016   Chronic neck pain (1ry area of Pain) (Bilateral) (R>L) 07/23/2016   Chronic low back pain (2ry area of Pain) (Bilateral) (L>R) w/ sciatica (Bilateral) 07/23/2016   Chronic knee pain (5th area of Pain) (Left) 07/23/2016   Midline thoracic back pain 07/23/2016   Chronic lower extremity pain (Bilateral) 07/23/2016   Incarcerated incisional hernia 02/16/2016   Incisional hernia 02/15/2016   Carpal tunnel syndrome (3ry area of Pain) (Bilateral) (L>R) 11/13/2015   Substance abuse ([place]) 09/07/2015   Paresthesia 08/02/2015   Chronic pain syndrome 08/02/2015   Postpartum endometritis 07/21/2015   PTSD (post-traumatic stress disorder) 05/16/2015   Severe episode of recurrent major depressive disorder, without psychotic features ([place]) 05/16/2015   GAD (generalized anxiety disorder) 05/16/2015   Difficult intravenous access 05/08/2015   Right optic neuritis 04/27/2015   Conversion disorder with attacks or seizures, persistent, with psychological stressor 03/28/2015   Migraine with aura and without  status migrainosus, not intractable 03/28/2015   Generalized anxiety disorder 03/28/2015   Conversion disorder 03/28/2015   HTN (hypertension) 02/27/2015   Perforated bowel ([place]) 02/27/2015   Depression 12/27/2014   Multiple sclerosis ([place]) 12/27/2014   Migraine 12/27/2014   Seizure disorder ([place]) 12/27/2014   Smoker 12/27/2014   Severe obesity (BMI >= 40) ([place]) 12/27/2014   DDD (degenerative disc disease), cervical 07/28/2014   Major depressive disorder, single episode 11/02/2013   Gastroesophageal reflux disease 10/25/2010   Tonsillitis 10/25/2010    Toniann Fail 08/15/2020, 2:48 PM  [facility] [address] [place], [place], [address] Phone: [phone]   Fax:  [phone]  Name: Emily [name] MRN: [id number] Date of Birth: [birth year]

## 2020-08-16 ENCOUNTER — Telehealth: Payer: Self-pay | Admitting: Neurology

## 2020-08-16 NOTE — Telephone Encounter (Signed)
Patient has questions about medication and the pharmacy,  This is for the infusion  she has  She has medicare and has to use a [employer].  She is not sure what to do

## 2020-08-16 NOTE — Telephone Encounter (Signed)
Patient is still waiting on Ocrevus pending authorization, patient called. She asked about her letter from [place], this was sent out in mail yesterday.

## 2020-08-17 DIAGNOSIS — M79675 Pain in left toe(s): Secondary | ICD-10-CM | POA: Diagnosis not present

## 2020-08-17 DIAGNOSIS — M79674 Pain in right toe(s): Secondary | ICD-10-CM | POA: Diagnosis not present

## 2020-08-17 DIAGNOSIS — R234 Changes in skin texture: Secondary | ICD-10-CM | POA: Diagnosis not present

## 2020-08-17 DIAGNOSIS — L600 Ingrowing nail: Secondary | ICD-10-CM | POA: Diagnosis not present

## 2020-08-17 NOTE — Telephone Encounter (Signed)
Pt called in and left a message with the Access Nurse. She stated she just spoke with the infusion company and some of her symptoms are coming back.

## 2020-08-17 NOTE — Telephone Encounter (Signed)
Pains in hands, back, symptoms from the past per pt. Faxed to [facility]. [phone]

## 2020-08-17 NOTE — Telephone Encounter (Signed)
For pain symptoms, it's still going to be follow-up with her Pain specialists. Pls let her know we are working on Ocrevus to start on/around 8/26, thanks

## 2020-08-17 NOTE — Telephone Encounter (Signed)
Pls check what symptoms she is having. I filled out Ocrevus form. Should start Ocrevus on/around Aug 26 (4 weeks off Gilenya). Thanks

## 2020-08-18 NOTE — Telephone Encounter (Signed)
We can do 3 days of IV steroids for relapse, but I would advise doing Ocrevus.

## 2020-08-18 NOTE — Telephone Encounter (Signed)
Pt wants a call back regarding her medicine she is supposed to take. She is relapsing and freaking out. She is extremely upset. Would like a call asap.

## 2020-08-18 NOTE — Telephone Encounter (Signed)
Unable to do infusion at this time, will resend notes, medicaid card, etc.

## 2020-08-20 DIAGNOSIS — S199XXA Unspecified injury of neck, initial encounter: Secondary | ICD-10-CM | POA: Diagnosis not present

## 2020-08-20 DIAGNOSIS — Z8616 Personal history of COVID-19: Secondary | ICD-10-CM | POA: Diagnosis not present

## 2020-08-20 DIAGNOSIS — S0012XA Contusion of left eyelid and periocular area, initial encounter: Secondary | ICD-10-CM | POA: Diagnosis not present

## 2020-08-20 DIAGNOSIS — Z973 Presence of spectacles and contact lenses: Secondary | ICD-10-CM | POA: Diagnosis not present

## 2020-08-20 DIAGNOSIS — Z885 Allergy status to narcotic agent status: Secondary | ICD-10-CM | POA: Diagnosis not present

## 2020-08-20 DIAGNOSIS — M47812 Spondylosis without myelopathy or radiculopathy, cervical region: Secondary | ICD-10-CM | POA: Diagnosis not present

## 2020-08-20 DIAGNOSIS — M4802 Spinal stenosis, cervical region: Secondary | ICD-10-CM | POA: Diagnosis not present

## 2020-08-20 DIAGNOSIS — Z886 Allergy status to analgesic agent status: Secondary | ICD-10-CM | POA: Diagnosis not present

## 2020-08-20 DIAGNOSIS — S0592XA Unspecified injury of left eye and orbit, initial encounter: Secondary | ICD-10-CM | POA: Diagnosis not present

## 2020-08-22 ENCOUNTER — Telehealth: Payer: Self-pay | Admitting: Neurology

## 2020-08-22 ENCOUNTER — Ambulatory Visit (HOSPITAL_COMMUNITY): Payer: Medicare Other | Admitting: Physical Therapy

## 2020-08-22 NOTE — Telephone Encounter (Signed)
Signed order faxed to [place] for [employer]

## 2020-08-22 NOTE — Telephone Encounter (Signed)
[name] from [facility] called needing a signed referral/order for [employer]. The  The one they received was not signed by the physician.  Fax: [phone]

## 2020-08-23 DIAGNOSIS — F902 Attention-deficit hyperactivity disorder, combined type: Secondary | ICD-10-CM | POA: Diagnosis not present

## 2020-08-23 DIAGNOSIS — F3132 Bipolar disorder, current episode depressed, moderate: Secondary | ICD-10-CM | POA: Diagnosis not present

## 2020-08-23 DIAGNOSIS — F4312 Post-traumatic stress disorder, chronic: Secondary | ICD-10-CM | POA: Diagnosis not present

## 2020-08-23 DIAGNOSIS — F064 Anxiety disorder due to known physiological condition: Secondary | ICD-10-CM | POA: Diagnosis not present

## 2020-08-24 ENCOUNTER — Telehealth: Payer: Self-pay | Admitting: Neurology

## 2020-08-24 DIAGNOSIS — G35 Multiple sclerosis: Secondary | ICD-10-CM
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
## 2020-08-29 ENCOUNTER — Encounter (HOSPITAL_COMMUNITY): Payer: Medicare Other | Admitting: Physical Therapy

## 2020-08-29 DIAGNOSIS — F4312 Post-traumatic stress disorder, chronic: Secondary | ICD-10-CM | POA: Diagnosis not present

## 2020-08-29 DIAGNOSIS — F3132 Bipolar disorder, current episode depressed, moderate: Secondary | ICD-10-CM | POA: Diagnosis not present

## 2020-08-29 DIAGNOSIS — F064 Anxiety disorder due to known physiological condition: Secondary | ICD-10-CM | POA: Diagnosis not present

## 2020-08-29 DIAGNOSIS — F902 Attention-deficit hyperactivity disorder, combined type: Secondary | ICD-10-CM | POA: Diagnosis not present

## 2020-08-31 ENCOUNTER — Telehealth: Payer: Self-pay

## 2020-08-31 NOTE — Telephone Encounter (Signed)
New message    Pending  Linet Burkholder KeyHilary Hertz - PA Case ID: [id number]  Need help? Call us at [phone]  Status Sent to Plan today  Drug Ocrevus '300MG'$ /10ML solutio  Form [employer] Electronic PA Form

## 2020-08-31 NOTE — Telephone Encounter (Signed)
F/u  Approved today  Kaisley Belkin Key: [facility] - PA Case ID: [id number] Need help? Call us at [phone] Outcome Approved today PA Case: [id number], Status: Approved, Coverage Starts on: 01/08/2020 12:00:00 AM, Coverage Ends on: 01/06/2021 12:00:00 AM. Questions? Contact [phone]. Drug Ocrevus '300MG'$ /10ML solution Form [employer] Electronic PA Form

## 2020-09-01 DIAGNOSIS — L600 Ingrowing nail: Secondary | ICD-10-CM | POA: Diagnosis not present

## 2020-09-01 NOTE — Telephone Encounter (Signed)
Pls see what her home situation is and where to send order for home health. She has been approved for Ocrevus and is now 4 weeks off Gilenya so should schedule Ocrevus as soon as she can, thanks

## 2020-09-04 ENCOUNTER — Telehealth: Payer: Self-pay | Admitting: Neurology

## 2020-09-04 ENCOUNTER — Other Ambulatory Visit: Payer: Self-pay

## 2020-09-04 DIAGNOSIS — G35 Multiple sclerosis: Secondary | ICD-10-CM

## 2020-09-04 NOTE — Telephone Encounter (Signed)
Home health order placed.

## 2020-09-04 NOTE — Telephone Encounter (Signed)
Pt called in stating [facility] tried to call Heather. The insurance covered the medication so they did not run it through the foundation. Forestine Na will be the one that requests the information for the infusion.

## 2020-09-05 ENCOUNTER — Encounter (HOSPITAL_COMMUNITY): Payer: Medicare Other | Admitting: Physical Therapy

## 2020-09-05 NOTE — Telephone Encounter (Signed)
Ocrevus order sent to [facility] specialty medical day care for them to call and get pt scheduled for treatment ,

## 2020-09-06 ENCOUNTER — Telehealth: Payer: Self-pay | Admitting: Neurology

## 2020-09-06 NOTE — Telephone Encounter (Signed)
Spoke with Levada Dy she stated that pt can not be covered for just a home aid she has to have a therapy attached and at this time pt does not want to do that, Levada Dy will help Emily [name] get the ball rolling with medicaid to get a form from her PCP so she can get a personal care giver to help her 6+ hours out of the day/ daily,

## 2020-09-06 NOTE — Telephone Encounter (Signed)
Angela called and LM with AN. Levada Dy from home health is needing to speak with heather about some miscommunication. The pt is wanting something different than what is ordered. She can be reached at [phone]

## 2020-09-07 ENCOUNTER — Telehealth: Payer: Self-pay | Admitting: Neurology

## 2020-09-08 ENCOUNTER — Telehealth: Payer: Self-pay | Admitting: Neurology

## 2020-09-08 NOTE — Telephone Encounter (Signed)
Pt called and informed that with optum she is cover 100% no copay for home infusion, she stated she would call when we got off the phone to get things set up when we get off the phone,

## 2020-09-08 NOTE — Telephone Encounter (Signed)
Pt called back no answer left a voice mail to call the office back  

## 2020-09-08 NOTE — Telephone Encounter (Signed)
See other phone note

## 2020-09-08 NOTE — Telephone Encounter (Signed)
Pt is returning heathers call.

## 2020-09-08 NOTE — Telephone Encounter (Signed)
Can you pls confirm with Ocrevus infusion staff how home infusion works and let her know logistics if it works for her? Thanks

## 2020-09-15 DIAGNOSIS — M5416 Radiculopathy, lumbar region: Secondary | ICD-10-CM | POA: Diagnosis not present

## 2020-09-15 DIAGNOSIS — Z79891 Long term (current) use of opiate analgesic: Secondary | ICD-10-CM | POA: Diagnosis not present

## 2020-09-15 DIAGNOSIS — M792 Neuralgia and neuritis, unspecified: Secondary | ICD-10-CM | POA: Diagnosis not present

## 2020-09-18 DIAGNOSIS — M5416 Radiculopathy, lumbar region: Secondary | ICD-10-CM | POA: Diagnosis not present

## 2020-09-21 ENCOUNTER — Telehealth: Payer: Self-pay

## 2020-09-21 DIAGNOSIS — F064 Anxiety disorder due to known physiological condition: Secondary | ICD-10-CM | POA: Diagnosis not present

## 2020-09-21 DIAGNOSIS — F3132 Bipolar disorder, current episode depressed, moderate: Secondary | ICD-10-CM | POA: Diagnosis not present

## 2020-09-21 DIAGNOSIS — F4312 Post-traumatic stress disorder, chronic: Secondary | ICD-10-CM | POA: Diagnosis not present

## 2020-09-21 DIAGNOSIS — F902 Attention-deficit hyperactivity disorder, combined type: Secondary | ICD-10-CM | POA: Diagnosis not present

## 2020-09-21 NOTE — Telephone Encounter (Signed)
The first dose of Ocrevus went well please see below and share with Dr. Delice Lesch regarding Suesan Bhatnagar.  300 mg of Ocrevus was added to 275m of 0.9% Sodium Chloride, then infused through a PIV using the Sapphire pump. The patient was able to tolerate the infusion well with only some mild lightheadedness and nausea. The rate was titrated as tolerated. After the patient ate, the lightheadedness and nausea improved. The PIV was then discontinued. Patient reports a history of MS, seizures, migraines, and abdominal surgeries. Next infusion is scheduled for 10/04/20.

## 2020-09-21 NOTE — Telephone Encounter (Signed)
Good to hear, thanks 

## 2020-09-22 ENCOUNTER — Other Ambulatory Visit: Payer: Self-pay

## 2020-09-22 ENCOUNTER — Ambulatory Visit (INDEPENDENT_AMBULATORY_CARE_PROVIDER_SITE_OTHER): Payer: Medicare Other | Admitting: Neurology

## 2020-09-22 DIAGNOSIS — G43709 Chronic migraine without aura, not intractable, without status migrainosus: Secondary | ICD-10-CM

## 2020-09-22 MED ORDER — ONABOTULINUMTOXINA 100 UNITS IJ SOLR
155.0000 [IU] | Freq: Once | INTRAMUSCULAR | Status: AC
  Administered 2020-09-22: 155 [IU] via INTRAMUSCULAR

## 2020-09-22 NOTE — Progress Notes (Signed)
[facility]   Procedure Note Botox  Attending: Dr. Metta Clines  Preoperative Diagnosis(es): Chronic migraine  Consent obtained from: The patient Benefits discussed included, but were not limited to decreased muscle tightness, increased joint range of motion, and decreased pain.  Risk discussed included, but were not limited pain and discomfort, bleeding, bruising, excessive weakness, venous thrombosis, muscle atrophy and dysphagia.  Anticipated outcomes of the procedure as well as he risks and benefits of the alternatives to the procedure, and the roles and tasks of the personnel to be involved, were discussed with the patient, and the patient consents to the procedure and agrees to proceed. A copy of the patient medication guide was given to the patient which explains the blackbox warning.  Patients identity and treatment sites confirmed Yes.  .  Details of Procedure: Skin was cleaned with alcohol. Prior to injection, the needle plunger was aspirated to make sure the needle was not within a blood vessel.  There was no blood retrieved on aspiration.    Following is a summary of the muscles injected  And the amount of Botulinum toxin used:  Dilution 200 units of Botox was reconstituted with 4 ml of preservative free normal saline. Time of reconstitution: At the time of the office visit (<30 minutes prior to injection)   Injections  155 total units of Botox was injected with a 30 gauge needle.  Injection Sites: L occipitalis: 15 units- 3 sites  R occiptalis: 15 units- 3 sites  L upper trapezius: 15 units- 3 sites R upper trapezius: 15 units- 3 sits          L paraspinal: 10 units- 2 sites R paraspinal: 10 units- 2 sites  Face L frontalis(2 injection sites):10 units   R frontalis(2 injection sites):10 units         L corrugator: 5 units   R corrugator: 5 units           Procerus: 5 units   L temporalis: 20 units R temporalis: 20 units   Agent:  200 units of botulinum Type  A (Onobotulinum Toxin type A) was reconstituted with 4 ml of preservative free normal saline.  Time of reconstitution: At the time of the office visit (<30 minutes prior to injection)     Total injected (Units):  155  Total wasted (Units):  none wasted  Patient tolerated procedure well without complications.   Reinjection is anticipated in 3 months.

## 2020-09-22 NOTE — Addendum Note (Signed)
Addended by: Venetia Night on: 09/22/2020 03:55 PM   Modules accepted: Orders

## 2020-09-23 DIAGNOSIS — J209 Acute bronchitis, unspecified: Secondary | ICD-10-CM | POA: Diagnosis not present

## 2020-09-23 DIAGNOSIS — G35 Multiple sclerosis: Secondary | ICD-10-CM | POA: Diagnosis not present

## 2020-09-25 ENCOUNTER — Telehealth: Payer: Self-pay | Admitting: Neurology

## 2020-09-25 NOTE — Telephone Encounter (Signed)
Pt called to let jaffe know she started ocrevus Wednesday, then she caught bronchitis, then an upper respiratory infection

## 2020-09-25 NOTE — Telephone Encounter (Signed)
Pt stated that on Thursday she has to walk to her transportation and started it to flair up with sweating and trouble breathing, she thought is was a cold on Friday she cam into the office and told front desk she had a cold and was seen for her botox, on Sunday she was seen at urgent care for bronchitis and upper respiratory infection she was given an inhaler an antibiotics. Pt stated that she called ocrevus and told them she had bronchitis and the respiratory infection after her infusion, pt laughing she didn't know she was as sick as she was when she cam in for her botox. Pt advised to let ocrevus know that she did not have a reaction to her infusion, she was informed I would let [name] and [name] know about her being sick.

## 2020-09-25 NOTE — Telephone Encounter (Signed)
Pt called no answer left a voice mail to call back  

## 2020-09-28 DIAGNOSIS — Z3042 Encounter for surveillance of injectable contraceptive: Secondary | ICD-10-CM | POA: Diagnosis not present

## 2020-10-09 ENCOUNTER — Telehealth: Payer: Self-pay

## 2020-10-09 ENCOUNTER — Telehealth: Payer: Self-pay | Admitting: Neurology

## 2020-10-09 NOTE — Telephone Encounter (Signed)
Happy to hear this.

## 2020-10-09 NOTE — Telephone Encounter (Signed)
Pt called to let aquino know since taking the meds, she is doing  a lot better. She said she is amazed how much better she feels. She wanted to let doctor know. She said all the symptoms she had called about before, is not there. She feels much better

## 2020-10-09 NOTE — Telephone Encounter (Signed)
Subject: 07 - Nursing Note Patient: Emily [name] Patient DOB: 17-Oct-1980 Author: Haroldine Laws Primary RN: Nunzio Cobbs, RN Care Team: [phone] Team H Physician: Ellouise Newer, MD Primary Payor: [employer] [id number]  300mg  of Ocrevus in 262ml of 0.9% Sodium Chloride IV was infused via the Sapphire pump over 2.75 hours. The patient tolerated the infusion well. The PIV was discontinued after the infusion was complete. Patient denies any recent medication or medical history changes. Refill note complete. Next scheduled visit is 03/28/2021.      [place] Account Manager, Neurology  Multiple Sclerosis     M [phone]  F  [phone]  [email or web address]     [place] [address]

## 2020-10-11 DIAGNOSIS — F064 Anxiety disorder due to known physiological condition: Secondary | ICD-10-CM | POA: Diagnosis not present

## 2020-10-11 DIAGNOSIS — F902 Attention-deficit hyperactivity disorder, combined type: Secondary | ICD-10-CM | POA: Diagnosis not present

## 2020-10-11 DIAGNOSIS — F4312 Post-traumatic stress disorder, chronic: Secondary | ICD-10-CM | POA: Diagnosis not present

## 2020-10-11 DIAGNOSIS — F3132 Bipolar disorder, current episode depressed, moderate: Secondary | ICD-10-CM | POA: Diagnosis not present

## 2020-10-13 DIAGNOSIS — M5416 Radiculopathy, lumbar region: Secondary | ICD-10-CM | POA: Diagnosis not present

## 2020-10-13 DIAGNOSIS — M792 Neuralgia and neuritis, unspecified: Secondary | ICD-10-CM | POA: Diagnosis not present

## 2020-10-23 ENCOUNTER — Telehealth: Payer: Self-pay | Admitting: Neurology

## 2020-10-23 NOTE — Telephone Encounter (Signed)
Pt needs a call back regarding paper work that she dropped off. Address is changed in system and she would like to know if she will get in home aid.

## 2020-10-24 NOTE — Telephone Encounter (Signed)
Paperwork has been placed on Dr. Amparo Bristol desk for review. Will update patient once Dr. Delice Lesch looks over ppw.

## 2020-10-27 DIAGNOSIS — F902 Attention-deficit hyperactivity disorder, combined type: Secondary | ICD-10-CM | POA: Diagnosis not present

## 2020-10-27 DIAGNOSIS — F3132 Bipolar disorder, current episode depressed, moderate: Secondary | ICD-10-CM | POA: Diagnosis not present

## 2020-10-27 DIAGNOSIS — F4312 Post-traumatic stress disorder, chronic: Secondary | ICD-10-CM | POA: Diagnosis not present

## 2020-10-27 DIAGNOSIS — F064 Anxiety disorder due to known physiological condition: Secondary | ICD-10-CM | POA: Diagnosis not present

## 2020-10-31 ENCOUNTER — Encounter: Payer: Self-pay | Admitting: Gastroenterology

## 2020-10-31 ENCOUNTER — Ambulatory Visit (INDEPENDENT_AMBULATORY_CARE_PROVIDER_SITE_OTHER): Payer: Medicare Other | Admitting: Gastroenterology

## 2020-10-31 ENCOUNTER — Other Ambulatory Visit: Payer: Self-pay

## 2020-10-31 ENCOUNTER — Encounter: Payer: Self-pay | Admitting: Internal Medicine

## 2020-10-31 DIAGNOSIS — R112 Nausea with vomiting, unspecified: Secondary | ICD-10-CM

## 2020-10-31 DIAGNOSIS — K219 Gastro-esophageal reflux disease without esophagitis: Secondary | ICD-10-CM

## 2020-10-31 MED ORDER — OMEPRAZOLE 20 MG PO CPDR: 20.0000 mg | DELAYED_RELEASE_CAPSULE | Freq: Two times a day (BID) | ORAL | 3 refills | Status: DC

## 2020-10-31 MED ORDER — DRONABINOL 2.5 MG PO CAPS: ORAL_CAPSULE | ORAL | 3 refills | Status: DC

## 2020-10-31 NOTE — Patient Instructions (Signed)
I have refilled medications for you.  Please call with any worsening!  We will see you in 6 months!   I enjoyed talking with you again today! As you know, I value our relationship and want to provide genuine, compassionate, and quality care. I welcome your feedback. If you receive a survey regarding your visit,  I greatly appreciate you taking time to fill this out. See you next time!  Annitta Needs, PhD, ANP-BC [facility]

## 2020-10-31 NOTE — Progress Notes (Signed)
Primary Care Physician:  Leeanne Rio, MD  Primary GI: Dr. Gala Romney   Patient Location: Home   Provider Location: [facility] office   Reason for Visit: Follow-up    Persons present on the virtual encounter, with roles: Patient and NP   Total time (minutes) spent on medical discussion: 10 minutes   Due to COVID-19, visit was conducted using virtual method.  Visit was requested by patient.  Virtual Visit via Telephone Note Due to COVID-19, visit is conducted virtually and was requested by patient.   I connected with Emily [name] on 10/31/20 at 10:00 AM EDT by telephone and verified that I am speaking with the correct person using two identifiers.   I discussed the limitations, risks, security and privacy concerns of performing an evaluation and management service by telephone and the availability of in person appointments. I also discussed with the patient that there may be a patient responsible charge related to this service. The patient expressed understanding and agreed to proceed.  Chief Complaint  Patient presents with   Gastroesophageal Reflux    Denies any nausea, no vomiting, no abd pain     History of Present Illness: 40 year old female presenting today via telephone with a history of chronic nausea, present since MS diagnosis. Remote history of EGD at [facility] 15 years ago. Previously on Zofran, Reglan, Compazine, Phenergan. Marinol has helped in the past. GES normal in the past. I had recommended EGD but she has had to cancel due to health issues and unable to complete at his time.   Placed on narcotics with tylenol and had stomach issues. Now off of this and doing well. Now on oxycodone. Taking Marinol before each meal (TID). Omeprazole BID. Pepcid as needed at bedtime. No dysphagia. No new GI symptoms. Doing well now. No constipation or diarrhea. No overt GI bleeding.   Past Medical History:  Diagnosis Date   ADHD    Anxiety    Asthma as a child   Chronic back  pain    Chronic pain    Conversion disorder    DDD (degenerative disc disease), cervical    Depression    External hemorrhoid    Fatty liver    Fibromyalgia    GERD (gastroesophageal reflux disease)    History of kidney stones    passed stones   HTN (hypertension) 02/27/2015   JC virus antibody positive    Migraines    MS (multiple sclerosis) ([place]) 06/2006   Neuromuscular disorder ([place])    MS   Neuropathy    OA (osteoarthritis)    Ovarian cyst    Panic attack    Perforated bowel ([place]) 2009   colostomy bag for 3 motnhs   Pneumonia    Pseudoseizures ([place])    last seizure was in 08/2018    PTSD (post-traumatic stress disorder)    PTSD (post-traumatic stress disorder)    S/P emergency C-section    Seasonal allergies    Urinary urgency    Vertigo    Wears contact lenses    Wears glasses      Past Surgical History:  Procedure Laterality Date   ABDOMINAL SURGERY     ABDOMINAL WOUND EXPLORATION   03/19/2019   Abdominal Wound Exploration with Explantation of Mesh   APPENDECTOMY     BOWEL RESECTION  01/2007   with colostomy   BOWEL RESECTION N/A 06/14/2019   Procedure: SMALL BOWEL RESECTION;  Surgeon: Ralene Ok, MD;  Location: [place];  Service: General;  Laterality: N/A;   CESAREAN SECTION N/A 07/12/2015   Procedure: CESAREAN SECTION;  Surgeon: Guss Bunde, MD;  Location: [place];  Service: Obstetrics;  Laterality: N/A;   COLON SURGERY     part of colon and intestines removed; colostomy 2008   COLONOSCOPY WITH PROPOFOL N/A 01/29/2017   Procedure: COLONOSCOPY WITH PROPOFOL;  Surgeon: Ileana Roup, MD;  Location: [facility];  Service: General;  Laterality: N/A;   COLOSTOMY CLOSURE  04/2007   EVALUATION UNDER ANESTHESIA WITH HEMORRHOIDECTOMY N/A 06/11/2018   Procedure: ANORECTAL EXAM UNDER ANESTHESIA WITH HEMORRHOIDECTOMY;  Surgeon: Ileana Roup, MD;  Location: [place];  Service: General;  Laterality: N/A;   EXCISIONAL  HEMORRHOIDECTOMY     EXTREMITY CYST EXCISION  1994   right leg   HERNIA REPAIR     INCISIONAL HERNIA REPAIR N/A 02/16/2016   Procedure: HERNIA REPAIR INCISIONAL;  Surgeon: Fanny Skates, MD;  Location: [facility];  Service: General;  Laterality: N/A;   INSERTION OF MESH  02/16/2016   Procedure: INSERTION OF MESH;  Surgeon: Fanny Skates, MD;  Location: [facility];  Service: General;;   LAPAROSCOPY N/A 06/14/2019   Procedure: ATTEMPTED DIAGNOSTIC LAPAROSCOPY;  Surgeon: Ralene Ok, MD;  Location: [place];  Service: General;  Laterality: N/A;   LAPAROTOMY  03/19/2019   Procedure: Abdominal Wound Exploration with Explantation of Mesh;  Surgeon: Ralene Ok, MD;  Location: [place];  Service: General;;   LAPAROTOMY N/A 06/14/2019   Procedure: EXPLORATORY LAPAROTOMY;  Surgeon: Ralene Ok, MD;  Location: [place];  Service: General;  Laterality: N/A;   LYSIS OF ADHESION N/A 06/14/2019   Procedure: LYSIS OF ADHESIONS;  Surgeon: Ralene Ok, MD;  Location: [place];  Service: General;  Laterality: N/A;   RADIOLOGY WITH ANESTHESIA N/A 03/06/2017   Procedure: MRI WITH ANESTHESIA OF CERVICAL SPINE WITHOUT CONTRAST, MRI OF LUMBAR SPINE WITHOUT CONTRAST;  Surgeon: Radiologist, Medication, MD;  Location: [place];  Service: Radiology;  Laterality: N/A;   RADIOLOGY WITH ANESTHESIA N/A 09/15/2018   Procedure: MRI OF BRAIN WITH AND WITHOUT CONTRAST;  Surgeon: Radiologist, Medication, MD;  Location: [place];  Service: Radiology;  Laterality: N/A;   RADIOLOGY WITH ANESTHESIA N/A 10/13/2018   Procedure: MRI OF BRAIN WITH AND WITHOUT CONTRAST;  Surgeon: Radiologist, Medication, MD;  Location: [place];  Service: Radiology;  Laterality: N/A;   RADIOLOGY WITH ANESTHESIA N/A 10/26/2018   Procedure: MRI UNDER ANESTHESIA; HEAD, CERVICAL AND THORASCIC SPINE;  Surgeon: Radiologist, Medication, MD;  Location: [place];  Service: Radiology;  Laterality: N/A;   RADIOLOGY WITH ANESTHESIA N/A 06/08/2020   Procedure: MRI WITH ANESTHESIA BRAIN WITH  AND WITHOUT CONTRAST.,  CERVICAL BRAIN WITH AND WITHOUT CONTRAST, THORACIC SPINE WITH AND WITHOUT;  Surgeon: Radiologist, Medication, MD;  Location: [place];  Service: Radiology;  Laterality: N/A;   SCAR REVISION  01/21/2011   Procedure: SCAR REVISION;  Surgeon: Hermelinda Dellen;  Location: [place];  Service: Plastics;  Laterality: N/A;  exploration of scar of abdomen and repair of defect   TOOTH EXTRACTION Left 10/2016   TRANSRECTAL DRAINAGE OF PELVIC ABSCESS       Current Meds  Medication Sig   albuterol (PROVENTIL HFA;VENTOLIN HFA) 108 (90 Base) MCG/ACT inhaler Inhale 2 puffs into the lungs every 6 (six) hours as needed for wheezing or shortness of breath. (Patient taking differently: Inhale 2 puffs into the lungs every 4 (four) hours as needed for wheezing or shortness of breath.)   amLODipine (NORVASC) 5 MG  tablet Take 5 mg by mouth daily.    amphetamine-dextroamphetamine (ADDERALL XR) 10 MG 24 hr capsule Take 10 mg by mouth daily.   cariprazine (VRAYLAR) capsule Take 3 mg by mouth at bedtime.   divalproex (DEPAKOTE) 500 MG DR tablet Take 1 tablet (500 mg total) by mouth at bedtime.   dronabinol (MARINOL) 2.5 MG capsule TAKE 1 CAPSULE BY MOUTH 3 TIMES DAILY WITH MEALS.   EPINEPHrine 0.3 mg/0.3 mL IJ SOAJ injection Inject 0.3 mg into the muscle as needed for anaphylaxis.    famotidine (PEPCID) 40 MG tablet Take 1 tablet (40 mg total) by mouth at bedtime.   guanFACINE (TENEX) 1 MG tablet Take 1 mg by mouth daily.   loratadine (CLARITIN) 10 MG tablet Take 10 mg by mouth daily as needed for allergies.   medroxyPROGESTERone (DEPO-PROVERA) 150 MG/ML injection Inject 150 mg into the muscle every 3 (three) months.   Naphazoline-Glycerin (CLEAR EYES REDNESS RELIEF OP) Place 1 drop into both eyes daily as needed (irrtiation).   omeprazole (PRILOSEC) 20 MG capsule TAKE 1 CAPSULE (20 MG TOTAL) BY MOUTH 2 (TWO) TIMES DAILY BEFORE A MEAL.   oxycodone (OXY-IR) 5 MG capsule Take 5 mg by  mouth in the morning and at bedtime.   pregabalin (LYRICA) 225 MG capsule Take 1 capsule (225 mg total) by mouth 2 (two) times daily. (Patient taking differently: 225mg  in am, 150mg  at lunch and 225mg  at night)   topiramate (TOPAMAX) 100 MG tablet Take 1.5 tablets twice a day   traZODone (DESYREL) 50 MG tablet Take 100 mg by mouth at bedtime.     Family History  Problem Relation Age of Onset   Diabetes Mother    Hypertension Mother    Diabetes Father    Hypertension Father    Arthritis Father    Cancer Maternal Grandmother    Cancer Maternal Grandfather    Alcohol abuse Neg Hx    Anxiety disorder Neg Hx    Bipolar disorder Neg Hx    Drug abuse Neg Hx    Depression Neg Hx    Colon cancer Neg Hx     Social History   Socioeconomic History   Marital status: Single    Spouse name: Not on file   Number of children: 1   Years of education: College   Highest education level: Not on file  Occupational History    Employer: OTHER    Comment: disability  Tobacco Use   Smoking status: Former    Packs/day: 0.25    Years: 10.00    Pack years: 2.50    Types: Cigarettes    Quit date: 07/12/2015    Years since quitting: 5.3   Smokeless tobacco: Never  Vaping Use   Vaping Use: Never used  Substance and Sexual Activity   Alcohol use: No    Alcohol/week: 0.0 standard drinks   Drug use: No    Comment: marinol that shows up at thc   Sexual activity: Not Currently    Partners: Male    Birth control/protection: Injection    Comment: Depo inj - next due 07/2020  Other Topics Concern   Not on file  Social History Narrative    Born and raised in [place], [place] by parents. Pt has one younger sister. Pt has a Best boy. Pt worked from 1999-2006. She stopped due to MS and is currently on disability.        Married for less than 1 yr in 1999 that ended  in divorce.      Caffeine Use: 1 20oz soda daily      Lives with mom and daughter   Social Determinants of Health    Financial Resource Strain: Not on file  Food Insecurity: Not on file  Transportation Needs: Not on file  Physical Activity: Not on file  Stress: Not on file  Social Connections: Not on file       Review of Systems: Gen: Denies fever, chills, anorexia. Denies fatigue, weakness, weight loss.  CV: Denies chest pain, palpitations, syncope, peripheral edema, and claudication. Resp: Denies dyspnea at rest, cough, wheezing, coughing up blood, and pleurisy. GI: see HPI Derm: Denies rash, itching, dry skin Psych: Denies depression, anxiety, memory loss, confusion. No homicidal or suicidal ideation.  Heme: Denies bruising, bleeding, and enlarged lymph nodes.  Observations/Objective: No distress. Unable to perform physical exam due to telephone encounter. No video available.   Assessment and Plan:  40 year old female presenting today via telephone with a history of chronic nausea, present since MS diagnosis. Remote history of EGD at [facility] 15 years ago. Previously on Zofran, Reglan, Compazine, Phenergan. Marinol has helped in the past. GES normal in the past.    Nausea controled on Marinol TID. Failed multiple other agents. I have continued to recommend EGD, but she has numerous other non-GI health issues that have precluded this. Continue omeprazole BID for now. We will see her in 6 months. She is to call if she changes in her mind in the interim regarding EGD.    I had recommended EGD but she has had to cancel due to health issues and unable to complete at his time.  Follow Up Instructions:    I discussed the assessment and treatment plan with the patient. The patient was provided an opportunity to ask questions and all were answered. The patient agreed with the plan and demonstrated an understanding of the instructions.   The patient was advised to call back or seek an in-person evaluation if the symptoms worsen or if the condition fails to improve as anticipated.  I provided 10  minutes of time during this telephone encounter.   Annitta Needs, PhD, ANP-BC [facility]

## 2020-11-08 ENCOUNTER — Other Ambulatory Visit: Payer: Self-pay

## 2020-11-08 ENCOUNTER — Encounter: Payer: Self-pay | Admitting: Dermatology

## 2020-11-08 ENCOUNTER — Ambulatory Visit (INDEPENDENT_AMBULATORY_CARE_PROVIDER_SITE_OTHER): Payer: Medicare Other | Admitting: Dermatology

## 2020-11-08 DIAGNOSIS — L739 Follicular disorder, unspecified: Secondary | ICD-10-CM

## 2020-11-08 DIAGNOSIS — L74510 Primary focal hyperhidrosis, axilla: Secondary | ICD-10-CM | POA: Diagnosis not present

## 2020-11-08 DIAGNOSIS — L700 Acne vulgaris: Secondary | ICD-10-CM

## 2020-11-08 MED ORDER — MINOCYCLINE HCL 50 MG PO CAPS: 50.0000 mg | ORAL_CAPSULE | Freq: Two times a day (BID) | ORAL | 1 refills | Status: DC

## 2020-11-08 MED ORDER — AMZEEQ 4 % EX FOAM: 1.0000 "application " | Freq: Every day | CUTANEOUS | 1 refills | Status: DC

## 2020-11-08 NOTE — Patient Instructions (Signed)
Certain Dri: #1 Doctor Recommended Clinical Strength

## 2020-11-10 DIAGNOSIS — M545 Low back pain, unspecified: Secondary | ICD-10-CM | POA: Diagnosis not present

## 2020-11-10 DIAGNOSIS — M5416 Radiculopathy, lumbar region: Secondary | ICD-10-CM | POA: Diagnosis not present

## 2020-11-13 ENCOUNTER — Telehealth: Payer: Self-pay | Admitting: Neurology

## 2020-11-13 NOTE — Telephone Encounter (Signed)
Pt called to let [name] know she got an ext. on her DMV paper work ,

## 2020-11-13 NOTE — Telephone Encounter (Signed)
Pt stated she called DMV back and they told her something different. Told her it just needed to be filled out and faxed to [facility].Marland Kitchen

## 2020-11-13 NOTE — Telephone Encounter (Signed)
Pls let her know that I can fill out the neurological part, but there are pages that her psychiatrist, eye doctor, and pain specialist also need to fill out. Pls confirm when her last stress seizure was, thanks

## 2020-11-13 NOTE — Telephone Encounter (Signed)
Pt would like the doctor to know she filed an ext and it was granted for the paper work. She wants to know where she stands with it all,

## 2020-11-13 NOTE — Telephone Encounter (Signed)
Pt called and informed [name] will fill out the neurological part, but there are pages that her psychiatrist, eye doctor, and pain specialist also need to fill out she became very upset said she was calling the DMV to see if that is true,  Her last stress seizure was 2 years ago

## 2020-11-13 NOTE — Telephone Encounter (Signed)
Pt stated that she was going by the [facility] and that she wants [place] to just do our part and fax it in to the [facility] because she stated that the [facility] told her that's all that needed to be done. I explained to the pt that she is being treated by psychiatrist, eye doctor, and pain specialist  and they all needed to fill out their part on the form she stated that was what she was told and she wants to go by the [facility] not by me nor [name]

## 2020-11-14 DIAGNOSIS — Z0279 Encounter for issue of other medical certificate: Secondary | ICD-10-CM

## 2020-11-14 NOTE — Telephone Encounter (Signed)
Done. But she will need eye doctor and her Pain Management doctor to fill out ophtho and musculoskeletal part. We can fax as requested but parts are missing. Thanks

## 2020-11-21 DIAGNOSIS — F4312 Post-traumatic stress disorder, chronic: Secondary | ICD-10-CM | POA: Diagnosis not present

## 2020-11-21 DIAGNOSIS — F064 Anxiety disorder due to known physiological condition: Secondary | ICD-10-CM | POA: Diagnosis not present

## 2020-11-21 DIAGNOSIS — F3132 Bipolar disorder, current episode depressed, moderate: Secondary | ICD-10-CM | POA: Diagnosis not present

## 2020-11-21 DIAGNOSIS — F902 Attention-deficit hyperactivity disorder, combined type: Secondary | ICD-10-CM | POA: Diagnosis not present

## 2020-11-27 ENCOUNTER — Encounter: Payer: Self-pay | Admitting: Dermatology

## 2020-11-27 DIAGNOSIS — F902 Attention-deficit hyperactivity disorder, combined type: Secondary | ICD-10-CM | POA: Diagnosis not present

## 2020-11-27 DIAGNOSIS — F064 Anxiety disorder due to known physiological condition: Secondary | ICD-10-CM | POA: Diagnosis not present

## 2020-11-27 DIAGNOSIS — F4312 Post-traumatic stress disorder, chronic: Secondary | ICD-10-CM | POA: Diagnosis not present

## 2020-11-27 DIAGNOSIS — F3132 Bipolar disorder, current episode depressed, moderate: Secondary | ICD-10-CM | POA: Diagnosis not present

## 2020-11-27 NOTE — Progress Notes (Signed)
   New Patient   Subjective  Emily [name] is a 40 y.o. female who presents for the following: New Patient (Initial Visit) (Patient here today for rash or allergic reaction on her face and both axilla x 1 year. Per patient she's used Triamcinolone that was prescribed by her PCP Dr. Huel Cote. ).  Rash on arms and face, localized excessive sweating Location:  Duration:  Quality:  Associated Signs/Symptoms: Modifying Factors:  Severity:  Timing: Context:    The following portions of the chart were reviewed this encounter and updated as appropriate:  Tobacco  Allergies  Meds  Problems  Med Hx  Surg Hx  Fam Hx      Objective  Well appearing patient in no apparent distress; mood and affect are within normal limits. Head - Anterior (Face) Inflammatory papules lower half of face  Right Axilla Patient feels that the rash is relatively mild today, perhaps a dozen follicular micropapules, nontender.  Differential could include folliculitis, Cherrie Distance disease, irritant dermatitis.  Left Breast, Right Shoulder - Anterior Patient mention this at the completion of the visit almost as an after thought.  She has tried several over-the-counter products.    A focused examination was performed including head, neck, axillae, and upper torso.. Relevant physical exam findings are noted in the Assessment and Plan.   Assessment & Plan  Acne vulgaris Head - Anterior (Face)  Amzeeq cash pay; if not affordable she can use mcn 50 bid. Patient aware one minocycline or the other not both.  minocycline (MINOCIN) 50 MG capsule - Head - Anterior (Face) Take 1 capsule (50 mg total) by mouth 2 (two) times daily.  Folliculitis Right Axilla  In view of her polypharmacy reviewed, we both would like to avoid more systemic therapy.  We will try topical minocycline daily after bathing for 4 weeks and a status update by MyChart.  Minocycline HCl Micronized (AMZEEQ) 4 % FOAM - Right Axilla Apply 1  application topically daily.  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

## 2020-12-01 DIAGNOSIS — G35 Multiple sclerosis: Secondary | ICD-10-CM | POA: Diagnosis not present

## 2020-12-01 DIAGNOSIS — Z79899 Other long term (current) drug therapy: Secondary | ICD-10-CM | POA: Diagnosis not present

## 2020-12-01 DIAGNOSIS — H469 Unspecified optic neuritis: Secondary | ICD-10-CM | POA: Diagnosis not present

## 2020-12-01 DIAGNOSIS — H35032 Hypertensive retinopathy, left eye: Secondary | ICD-10-CM | POA: Diagnosis not present

## 2020-12-05 DIAGNOSIS — R070 Pain in throat: Secondary | ICD-10-CM | POA: Diagnosis not present

## 2020-12-05 DIAGNOSIS — J040 Acute laryngitis: Secondary | ICD-10-CM | POA: Diagnosis not present

## 2020-12-05 DIAGNOSIS — Z20822 Contact with and (suspected) exposure to covid-19: Secondary | ICD-10-CM | POA: Diagnosis not present

## 2020-12-06 DIAGNOSIS — M5412 Radiculopathy, cervical region: Secondary | ICD-10-CM | POA: Diagnosis not present

## 2020-12-06 DIAGNOSIS — M5416 Radiculopathy, lumbar region: Secondary | ICD-10-CM | POA: Diagnosis not present

## 2020-12-06 DIAGNOSIS — M792 Neuralgia and neuritis, unspecified: Secondary | ICD-10-CM | POA: Diagnosis not present

## 2020-12-06 DIAGNOSIS — M545 Low back pain, unspecified: Secondary | ICD-10-CM | POA: Diagnosis not present

## 2020-12-18 DIAGNOSIS — Z3042 Encounter for surveillance of injectable contraceptive: Secondary | ICD-10-CM | POA: Diagnosis not present

## 2020-12-21 ENCOUNTER — Telehealth: Payer: Self-pay | Admitting: Gastroenterology

## 2020-12-21 DIAGNOSIS — F4312 Post-traumatic stress disorder, chronic: Secondary | ICD-10-CM | POA: Diagnosis not present

## 2020-12-21 DIAGNOSIS — F3132 Bipolar disorder, current episode depressed, moderate: Secondary | ICD-10-CM | POA: Diagnosis not present

## 2020-12-21 DIAGNOSIS — F902 Attention-deficit hyperactivity disorder, combined type: Secondary | ICD-10-CM | POA: Diagnosis not present

## 2020-12-21 NOTE — Telephone Encounter (Signed)
Returned the pt's call and advised the pt that this has to go through her PCP. I advised to call them and leave a message for the nurse to call her that way the nurse can write up a note for the doctor.

## 2020-12-21 NOTE — Telephone Encounter (Signed)
Patient is considering gastric bypass and wants to know if she needs to see our office before pursuing it, or if that would be her pcp

## 2020-12-22 ENCOUNTER — Ambulatory Visit (INDEPENDENT_AMBULATORY_CARE_PROVIDER_SITE_OTHER): Payer: Medicare Other | Admitting: Neurology

## 2020-12-22 ENCOUNTER — Other Ambulatory Visit: Payer: Self-pay

## 2020-12-22 DIAGNOSIS — G43709 Chronic migraine without aura, not intractable, without status migrainosus: Secondary | ICD-10-CM

## 2020-12-22 MED ORDER — ONABOTULINUMTOXINA 100 UNITS IJ SOLR
160.0000 [IU] | Freq: Once | INTRAMUSCULAR | Status: AC
  Administered 2020-12-22: 160 [IU] via INTRAMUSCULAR

## 2020-12-22 NOTE — Progress Notes (Signed)
[facility]   Procedure Note Botox  Attending: Dr. Metta Clines  Preoperative Diagnosis(es): Chronic migraine  Consent obtained from: The patient Benefits discussed included, but were not limited to decreased muscle tightness, increased joint range of motion, and decreased pain.  Risk discussed included, but were not limited pain and discomfort, bleeding, bruising, excessive weakness, venous thrombosis, muscle atrophy and dysphagia.  Anticipated outcomes of the procedure as well as he risks and benefits of the alternatives to the procedure, and the roles and tasks of the personnel to be involved, were discussed with the patient, and the patient consents to the procedure and agrees to proceed. A copy of the patient medication guide was given to the patient which explains the blackbox warning.  Patients identity and treatment sites confirmed Yes.  .  Details of Procedure: Skin was cleaned with alcohol. Prior to injection, the needle plunger was aspirated to make sure the needle was not within a blood vessel.  There was no blood retrieved on aspiration.    Following is a summary of the muscles injected  And the amount of Botulinum toxin used:  Dilution 200 units of Botox was reconstituted with 4 ml of preservative free normal saline. Time of reconstitution: At the time of the office visit (<30 minutes prior to injection)   Injections  155 total units of Botox was injected with a 30 gauge needle.  Injection Sites: L occipitalis: 15 units- 3 sites  R occiptalis: 15 units- 3 sites  L upper trapezius: 15 units- 3 sites R upper trapezius: 15 units- 3 sits          L paraspinal: 10 units- 2 sites R paraspinal: 10 units- 2 sites  Face L frontalis(2 injection sites):10 units   R frontalis(2 injection sites):10 units         L corrugator: 5 units   R corrugator: 5 units           Procerus: 5 units   L temporalis: 20 units R temporalis: 20 units   Agent:  200 units of botulinum Type  A (Onobotulinum Toxin type A) was reconstituted with 4 ml of preservative free normal saline.  Time of reconstitution: At the time of the office visit (<30 minutes prior to injection)     Total injected (Units):  155  Total wasted (Units):  5  Patient tolerated procedure well without complications.   Reinjection is anticipated in 3 months.

## 2020-12-25 DIAGNOSIS — F3132 Bipolar disorder, current episode depressed, moderate: Secondary | ICD-10-CM | POA: Diagnosis not present

## 2020-12-25 DIAGNOSIS — F4312 Post-traumatic stress disorder, chronic: Secondary | ICD-10-CM | POA: Diagnosis not present

## 2020-12-25 DIAGNOSIS — F902 Attention-deficit hyperactivity disorder, combined type: Secondary | ICD-10-CM | POA: Diagnosis not present

## 2020-12-26 DIAGNOSIS — Z79891 Long term (current) use of opiate analgesic: Secondary | ICD-10-CM | POA: Diagnosis not present

## 2021-02-05 ENCOUNTER — Telehealth: Payer: Self-pay | Admitting: Internal Medicine

## 2021-02-05 DIAGNOSIS — K219 Gastro-esophageal reflux disease without esophagitis: Secondary | ICD-10-CM

## 2021-02-05 DIAGNOSIS — R112 Nausea with vomiting, unspecified: Secondary | ICD-10-CM

## 2021-02-05 NOTE — Telephone Encounter (Signed)
Returned the pt's call and LMOVM to call back

## 2021-02-05 NOTE — Telephone Encounter (Signed)
[phone] patient is changing her pharmacy and she will need to pick up a hard copy of her pain prescription so she can transfer that.

## 2021-02-07 NOTE — Telephone Encounter (Signed)
Hey Xitlali, This is Forensic scientist, what Rx are you referring to.  This MyChart message has not been read.

## 2021-02-07 NOTE — Telephone Encounter (Signed)
Phoned the pt and LMOVM for the pt to return call 

## 2021-02-08 ENCOUNTER — Telehealth: Payer: Self-pay

## 2021-02-08 NOTE — Telephone Encounter (Signed)
Addressed in pt calls

## 2021-02-08 NOTE — Telephone Encounter (Signed)
Emily [name], This can wait until you are in the office, the pt has some but her Rx coverage advises her to do this. She is not in need right now and I advised you were off today and it will be next week before this will be addressed

## 2021-02-08 NOTE — Telephone Encounter (Signed)
New message  Benefit Verification [id number] Submitted! For BV Basic submissions, please allow 2 business days for results.  For BV Full submissions, please allow 4 business days for results.

## 2021-02-08 NOTE — Telephone Encounter (Signed)
This Rx has to be faxed by [place] so please send the pharmacy information so this can be done.   Thank you, Dena

## 2021-02-08 NOTE — Telephone Encounter (Signed)
See pt call note

## 2021-02-08 NOTE — Telephone Encounter (Signed)
Pt advise column has notes in it as well.

## 2021-02-08 NOTE — Telephone Encounter (Signed)
Hi Emily [name] is off today and at the hospital tomorrow, it may be next week before this can happen. We had tried to call youu several times so there would be no delay. I will put it in her box to address.  Last read by Jaionna L Mikles at 11:21 AM on 02/08/2021.

## 2021-02-08 NOTE — Telephone Encounter (Signed)
02/08/2021 Dronabinol 2.5mg  3 times a day.

## 2021-02-08 NOTE — Telephone Encounter (Signed)
[facility] [phone]

## 2021-02-09 MED ORDER — DRONABINOL 2.5 MG PO CAPS: ORAL_CAPSULE | ORAL | 3 refills | Status: DC

## 2021-02-09 NOTE — Telephone Encounter (Signed)
I completed prescription. Please fax to pharmacy. Thanks!

## 2021-02-09 NOTE — Telephone Encounter (Signed)
BOTOX (onabotulinumtoxinA)  ACQUISITION - MAJOR MEDICAL BENEFITS List of Specialty Pharmacies may not include all available options. If preferred Specialty Pharmacy is not listed, check with payer.  [?]Buy and Bill [ ]  Buy and [place] Available [ ]  Specialty Pharmacy Required [ ]  Prescription Coverage Only - See Pharmacy Benefits Section [ ]  Payer will not release information Specialty Pharmacies:     [place]  The plan's renewal date is 01/07/2022. This plan runs on a calendar year basis. This medical plan is secondary and will review and consider payment for approved services after the primary medical payer processes the claim. [employer] has attempted to coordinate acquisition options between Medicare and [place] Medicaid. Please contact the payers directly with any questions. For additional payer coverage criteria, please reference the BOTOX Policies and Forms link available at [email or web address]. Could This Patient Be Eligible For Additional Savings? Assistance with out-of-pocket costs for BOTOX (onabotulinumtoxinA) treatment may be available for eligible, commercially insured patients through the [place]. Restrictions and maximum savings limits apply. Patient out-of-pocket expense may vary. Offer not valid for patients enrolled in Medicare, Medicaid, or other federal or state healthcare programs. Please see full terms and conditions at [email or web address]. For questions about this program, please call 1-877-4-BOTOX-1.

## 2021-02-09 NOTE — Addendum Note (Signed)
Addended by: Annitta Needs on: 02/09/2021 01:46 PM   Modules accepted: Orders

## 2021-02-12 NOTE — Telephone Encounter (Signed)
Rx faxed to pt's pharmacy.  

## 2021-02-27 ENCOUNTER — Telehealth: Payer: Self-pay | Admitting: Internal Medicine

## 2021-02-27 NOTE — Telephone Encounter (Signed)
Pt has questions about her prescription. [phone]

## 2021-02-27 NOTE — Telephone Encounter (Signed)
Phoned and spoke to the pt regarding her Marinol. Advised her that we have not received the PA form for it , but once we do I will advise her regarding whether it was approved or not. Pt expressed understanding

## 2021-02-28 ENCOUNTER — Other Ambulatory Visit: Payer: Self-pay | Admitting: *Deleted

## 2021-02-28 ENCOUNTER — Telehealth: Payer: Self-pay | Admitting: Internal Medicine

## 2021-02-28 DIAGNOSIS — R112 Nausea with vomiting, unspecified: Secondary | ICD-10-CM

## 2021-02-28 DIAGNOSIS — L700 Acne vulgaris: Secondary | ICD-10-CM

## 2021-02-28 DIAGNOSIS — K219 Gastro-esophageal reflux disease without esophagitis: Secondary | ICD-10-CM

## 2021-02-28 MED ORDER — MINOCYCLINE HCL 50 MG PO CAPS: 50.0000 mg | ORAL_CAPSULE | Freq: Two times a day (BID) | ORAL | 1 refills | Status: DC

## 2021-02-28 NOTE — Telephone Encounter (Signed)
Documentation faxed to [facility] regarding the pt's necessity for Dronabinol cap 2.5 mg to be taken 3 times a day. Waiting for a response

## 2021-02-28 NOTE — Telephone Encounter (Signed)
Pt sent a response on her MyChart regarding this Rx

## 2021-02-28 NOTE — Telephone Encounter (Signed)
Pt calling again to see if her pharmacy has faxed her prescription. [phone]

## 2021-02-28 NOTE — Telephone Encounter (Signed)
PA sent to Cover My Meds waiting on a response. Dx: K21.9 GERD, and R11.2 NONRETRACTABLE VOMITING WITH NAUSEA

## 2021-03-01 ENCOUNTER — Telehealth: Payer: Self-pay | Admitting: Gastroenterology

## 2021-03-01 NOTE — Telephone Encounter (Signed)
PA for Dronabinol cap 2.5 mg has been denied due to non covered diagnosis. Appeal has been submitted. Waiting for approval/denial from insurance company.

## 2021-03-01 NOTE — Telephone Encounter (Signed)
Spoke with patient and informed her that I have submitted the appeal and that I transferred her mychart messages to [place]. Pt verbalized understanding.

## 2021-03-01 NOTE — Telephone Encounter (Signed)
We have been working on this patient's PA for her dronabinol. I have submitted an appeal and I am currently waiting on a response. I spoke with the insurance company and they are stating that it will take up to 72 hrs to get a determination.

## 2021-03-01 NOTE — Telephone Encounter (Signed)
Please call patient, she has a question about a prescription

## 2021-03-02 NOTE — Telephone Encounter (Signed)
Pt LMOVM regarding pharmacy stating that if the quantity is 60 or below it will be approved. I have already contacted the pt via MyChart and according to documentation they are wanting to know if th member has Spasticity associated with her Multiple Sclerosis, they are also wanting medical records sent to them regarding this pt. Please advise

## 2021-03-05 ENCOUNTER — Telehealth: Payer: Self-pay

## 2021-03-05 NOTE — Telephone Encounter (Signed)
Looks like it has been approved. I sent message to patient.

## 2021-03-05 NOTE — Telephone Encounter (Signed)
PA for Dronabinol 2.5 mg capsule has been approved from 03/01/2021 through 01/06/2022. Approval letter to be scanned into patient's chart.

## 2021-03-06 NOTE — Telephone Encounter (Signed)
I worked on this on Thursday and was able to get it approved through her medicare and her medicaid incase the medicare fell through. Pt was notified by [facility] on Sat of her approval. Documents will be scanned into patient's chart once Dena returns.

## 2021-03-23 ENCOUNTER — Telehealth: Payer: Self-pay | Admitting: Neurology

## 2021-03-23 ENCOUNTER — Other Ambulatory Visit: Payer: Self-pay

## 2021-03-23 ENCOUNTER — Ambulatory Visit (INDEPENDENT_AMBULATORY_CARE_PROVIDER_SITE_OTHER): Payer: Medicare Other | Admitting: Neurology

## 2021-03-23 DIAGNOSIS — G43709 Chronic migraine without aura, not intractable, without status migrainosus: Secondary | ICD-10-CM

## 2021-03-23 MED ORDER — ONABOTULINUMTOXINA 100 UNITS IJ SOLR
167.0000 [IU] | Freq: Once | INTRAMUSCULAR | Status: AC
  Administered 2021-03-23: 167 [IU] via INTRAMUSCULAR

## 2021-03-23 NOTE — Progress Notes (Signed)
[facility]  ? ?Procedure Note Botox ? ?Attending: Dr. Metta Clines ? ?Preoperative Diagnosis(es): Chronic migraine ? ?Consent obtained from: The patient ?Benefits discussed included, but were not limited to decreased muscle tightness, increased joint range of motion, and decreased pain.  Risk discussed included, but were not limited pain and discomfort, bleeding, bruising, excessive weakness, venous thrombosis, muscle atrophy and dysphagia.  Anticipated outcomes of the procedure as well as he risks and benefits of the alternatives to the procedure, and the roles and tasks of the personnel to be involved, were discussed with the patient, and the patient consents to the procedure and agrees to proceed. A copy of the patient medication guide was given to the patient which explains the blackbox warning. ? ?Patients identity and treatment sites confirmed Yes.  . ? ?Details of Procedure: ?Skin was cleaned with alcohol. Prior to injection, the needle plunger was aspirated to make sure the needle was not within a blood vessel.  There was no blood retrieved on aspiration.   ? ?Following is a summary of the muscles injected  And the amount of Botulinum toxin used: ? ?Dilution ?200 units of Botox was reconstituted with 4 ml of preservative free normal saline. ?Time of reconstitution: At the time of the office visit (<30 minutes prior to injection)  ? ?Injections  ?155 total units of Botox was injected with a 30 gauge needle. ? ?Injection Sites: ?L occipitalis: 15 units- 3 sites  ?R occiptalis: 15 units- 3 sites ? ?L upper trapezius: 15 units- 3 sites ?R upper trapezius: 15 units- 3 sits          ?L paraspinal: 10 units- 2 sites ?R paraspinal: 10 units- 2 sites ? ?Face ?L frontalis(2 injection sites):10 units   ?R frontalis(2 injection sites):10 units         ?L corrugator: 5 units   ?R corrugator: 5 units           ?Procerus: 5 units   ?L temporalis: 20 units ?R temporalis: 20 units  ? ?Agent:  ?200 units of botulinum Type  A (Onobotulinum Toxin type A) was reconstituted with 4 ml of preservative free normal saline.  ?Time of reconstitution: At the time of the office visit (<30 minutes prior to injection)  ? ? ? Total injected (Units):  155 ? Total wasted (Units):  12 ? ?Patient tolerated procedure well without complications.   ?Reinjection is anticipated in 3 months. ? ? ?

## 2021-03-23 NOTE — Telephone Encounter (Signed)
Please confirm when her last seizure was. The only thing I can write on the letter is when her last seizure was, they would usually want to know that she is 6 months seizure-free. I cannot write if she is capable or not, just that she has been seizure-free for how long.  ?

## 2021-03-23 NOTE — Telephone Encounter (Signed)
Pt stated that they have the form they just need a letter from [name] that she is capable of driving,  ?

## 2021-03-23 NOTE — Telephone Encounter (Signed)
Pt stated that her last seizure was 2.5 years ago.  ?

## 2021-03-23 NOTE — Telephone Encounter (Signed)
Patient called and stated she got a letter from the [facility] stating that she needs a letter with clearance to drive.  She only has 30 days from March 7 to send it in.  The fax number is [phone].  She also asked if we could let her know when it is faxed so she has proof that it was sent. ?

## 2021-03-27 ENCOUNTER — Emergency Department (HOSPITAL_COMMUNITY): Payer: Medicare Other

## 2021-03-27 ENCOUNTER — Emergency Department (HOSPITAL_COMMUNITY)
Admission: EM | Admit: 2021-03-27 | Discharge: 2021-03-27 | Disposition: A | Discharge status: Home / Self Care | Payer: Medicare Other | Attending: Emergency Medicine | Admitting: Emergency Medicine

## 2021-03-27 ENCOUNTER — Encounter (HOSPITAL_COMMUNITY): Payer: Self-pay

## 2021-03-27 ENCOUNTER — Other Ambulatory Visit: Payer: Self-pay

## 2021-03-27 DIAGNOSIS — J45909 Unspecified asthma, uncomplicated: Secondary | ICD-10-CM | POA: Diagnosis not present

## 2021-03-27 DIAGNOSIS — Z87891 Personal history of nicotine dependence: Secondary | ICD-10-CM | POA: Diagnosis not present

## 2021-03-27 DIAGNOSIS — Z20822 Contact with and (suspected) exposure to covid-19: Secondary | ICD-10-CM | POA: Insufficient documentation

## 2021-03-27 DIAGNOSIS — I10 Essential (primary) hypertension: Secondary | ICD-10-CM | POA: Insufficient documentation

## 2021-03-27 DIAGNOSIS — R059 Cough, unspecified: Secondary | ICD-10-CM | POA: Diagnosis present

## 2021-03-27 DIAGNOSIS — B349 Viral infection, unspecified: Secondary | ICD-10-CM

## 2021-03-27 LAB — GROUP A STREP BY PCR: Group A Strep by PCR: NOT DETECTED

## 2021-03-27 LAB — RESP PANEL BY RT-PCR (FLU A&B, COVID) ARPGX2
Influenza A by PCR: NEGATIVE
Influenza B by PCR: NEGATIVE
SARS Coronavirus 2 by RT PCR: NEGATIVE

## 2021-03-27 NOTE — Discharge Instructions (Addendum)
You were evaluated in the Emergency Department and after careful evaluation, we did not find any emergent condition requiring admission or further testing in the hospital. ? ?Your exam/testing today was overall reassuring.  Symptoms seem to be due to a viral illness.  Recommend continued follow-up with your regular doctors, Tylenol and Motrin at home for discomfort. ? ?Please return to the Emergency Department if you experience any worsening of your condition.  Thank you for allowing [place] to be a part of your care. ? ?

## 2021-03-27 NOTE — ED Notes (Signed)
Pt walked to the bathroom unassisted with a steady even gait. ? ?

## 2021-03-27 NOTE — ED Provider Notes (Signed)
?[place] DEPT ?[facility] Emergency Department ?Provider Note ?MRN:  [id number]  ?Arrival date & time: 03/27/21    ? ?Chief Complaint   ?Multiple complaints ?  ?History of Present Illness   ?Emily [name] is a 41 y.o. year-old female with a history of MS presenting to the ED with chief complaint of multiple complaints. ? ?Cough, sore throat, chest congestion/pressure, throat drainage, lymphadenopathy for the past 2 or 3 days.  Patient also has some unintentional weight loss recently.  She is scheduled to get a CT scan of her neck as an outpatient next week. ? ?Review of Systems  ?A thorough review of systems was obtained and all systems are negative except as noted in the HPI and PMH.  ? ?Patient's Health History   ? ?Past Medical History:  ?Diagnosis Date  ? ADHD   ? Anxiety   ? Asthma as a child  ? Chronic back pain   ? Chronic pain   ? Conversion disorder   ? DDD (degenerative disc disease), cervical   ? Depression   ? External hemorrhoid   ? Fatty liver   ? Fibromyalgia   ? GERD (gastroesophageal reflux disease)   ? History of kidney stones   ? passed stones  ? HTN (hypertension) 02/27/2015  ? JC virus antibody positive   ? Migraines   ? MS (multiple sclerosis) ([place]) 06/2006  ? Neuromuscular disorder ([place])   ? MS  ? Neuropathy   ? OA (osteoarthritis)   ? Ovarian cyst   ? Panic attack   ? Perforated bowel ([place]) 2009  ? colostomy bag for 3 motnhs  ? Pneumonia   ? Pseudoseizures ([place])   ? last seizure was in 08/2018   ? PTSD (post-traumatic stress disorder)   ? PTSD (post-traumatic stress disorder)   ? S/P emergency C-section   ? Seasonal allergies   ? Urinary urgency   ? Vertigo   ? Wears contact lenses   ? Wears glasses   ?  ?Past Surgical History:  ?Procedure Laterality Date  ? ABDOMINAL SURGERY    ? ABDOMINAL WOUND EXPLORATION   03/19/2019  ? Abdominal Wound Exploration with Explantation of Mesh  ? APPENDECTOMY    ? BOWEL RESECTION  01/2007  ? with colostomy  ? BOWEL RESECTION N/A 06/14/2019  ?  Procedure: SMALL BOWEL RESECTION;  Surgeon: Ralene Ok, MD;  Location: [place];  Service: General;  Laterality: N/A;  ? CESAREAN SECTION N/A 07/12/2015  ? Procedure: CESAREAN SECTION;  Surgeon: Guss Bunde, MD;  Location: [place];  Service: Obstetrics;  Laterality: N/A;  ? COLON SURGERY    ? part of colon and intestines removed; colostomy 2008  ? COLONOSCOPY WITH PROPOFOL N/A 01/29/2017  ? Procedure: COLONOSCOPY WITH PROPOFOL;  Surgeon: Ileana Roup, MD;  Location: [facility];  Service: General;  Laterality: N/A;  ? COLOSTOMY CLOSURE  04/2007  ? EVALUATION UNDER ANESTHESIA WITH HEMORRHOIDECTOMY N/A 06/11/2018  ? Procedure: ANORECTAL EXAM UNDER ANESTHESIA WITH HEMORRHOIDECTOMY;  Surgeon: Ileana Roup, MD;  Location: [place];  Service: General;  Laterality: N/A;  ? EXCISIONAL HEMORRHOIDECTOMY    ? EXTREMITY CYST EXCISION  1994  ? right leg  ? HERNIA REPAIR    ? INCISIONAL HERNIA REPAIR N/A 02/16/2016  ? Procedure: HERNIA REPAIR INCISIONAL;  Surgeon: Fanny Skates, MD;  Location: [facility];  Service: General;  Laterality: N/A;  ? INSERTION OF MESH  02/16/2016  ? Procedure: INSERTION OF MESH;  Surgeon: Fanny Skates, MD;  Location: [facility];  Service: General;;  ? LAPAROSCOPY N/A 06/14/2019  ? Procedure: ATTEMPTED DIAGNOSTIC LAPAROSCOPY;  Surgeon: Ralene Ok, MD;  Location: [place];  Service: General;  Laterality: N/A;  ? LAPAROTOMY  03/19/2019  ? Procedure: Abdominal Wound Exploration with Explantation of Mesh;  Surgeon: Ralene Ok, MD;  Location: [place];  Service: General;;  ? LAPAROTOMY N/A 06/14/2019  ? Procedure: EXPLORATORY LAPAROTOMY;  Surgeon: Ralene Ok, MD;  Location: [place];  Service: General;  Laterality: N/A;  ? LYSIS OF ADHESION N/A 06/14/2019  ? Procedure: LYSIS OF ADHESIONS;  Surgeon: Ralene Ok, MD;  Location: [place];  Service: General;  Laterality: N/A;  ? RADIOLOGY WITH ANESTHESIA N/A 03/06/2017  ? Procedure: MRI WITH ANESTHESIA OF CERVICAL SPINE  WITHOUT CONTRAST, MRI OF LUMBAR SPINE WITHOUT CONTRAST;  Surgeon: Radiologist, Medication, MD;  Location: [place];  Service: Radiology;  Laterality: N/A;  ? RADIOLOGY WITH ANESTHESIA N/A 09/15/2018  ? Procedure: MRI OF BRAIN WITH AND WITHOUT CONTRAST;  Surgeon: Radiologist, Medication, MD;  Location: [place];  Service: Radiology;  Laterality: N/A;  ? RADIOLOGY WITH ANESTHESIA N/A 10/13/2018  ? Procedure: MRI OF BRAIN WITH AND WITHOUT CONTRAST;  Surgeon: Radiologist, Medication, MD;  Location: [place];  Service: Radiology;  Laterality: N/A;  ? RADIOLOGY WITH ANESTHESIA N/A 10/26/2018  ? Procedure: MRI UNDER ANESTHESIA; HEAD, CERVICAL AND THORASCIC SPINE;  Surgeon: Radiologist, Medication, MD;  Location: [place];  Service: Radiology;  Laterality: N/A;  ? RADIOLOGY WITH ANESTHESIA N/A 06/08/2020  ? Procedure: MRI WITH ANESTHESIA BRAIN WITH AND WITHOUT CONTRAST.,  CERVICAL BRAIN WITH AND WITHOUT CONTRAST, THORACIC SPINE WITH AND WITHOUT;  Surgeon: Radiologist, Medication, MD;  Location: [place];  Service: Radiology;  Laterality: N/A;  ? SCAR REVISION  01/21/2011  ? Procedure: SCAR REVISION;  Surgeon: Hermelinda Dellen;  Location: [place];  Service: Plastics;  Laterality: N/A;  exploration of scar of abdomen and repair of defect  ? TOOTH EXTRACTION Left 10/2016  ? TRANSRECTAL DRAINAGE OF PELVIC ABSCESS    ?  ?Family History  ?Problem Relation Age of Onset  ? Diabetes Mother   ? Hypertension Mother   ? Diabetes Father   ? Hypertension Father   ? Arthritis Father   ? Cancer Maternal Grandmother   ? Cancer Maternal Grandfather   ? Alcohol abuse Neg Hx   ? Anxiety disorder Neg Hx   ? Bipolar disorder Neg Hx   ? Drug abuse Neg Hx   ? Depression Neg Hx   ? Colon cancer Neg Hx   ?  ?Social History  ? ?Socioeconomic History  ? Marital status: Single  ?  Spouse name: Not on file  ? Number of children: 1  ? Years of education: College  ? Highest education level: Not on file  ?Occupational History  ?  Employer: OTHER  ?   Comment: disability  ?Tobacco Use  ? Smoking status: Former  ?  Packs/day: 0.25  ?  Years: 10.00  ?  Pack years: 2.50  ?  Types: Cigarettes  ?  Quit date: 07/12/2015  ?  Years since quitting: 5.7  ? Smokeless tobacco: Never  ?Vaping Use  ? Vaping Use: Never used  ?Substance and Sexual Activity  ? Alcohol use: No  ?  Alcohol/week: 0.0 standard drinks  ? Drug use: No  ?  Comment: marinol that shows up at thc  ? Sexual activity: Not Currently  ?  Partners: Male  ?  Birth control/protection: Injection  ?  Comment: Depo inj - next due 07/2020  ?Other Topics Concern  ? Not on file  ?Social History Narrative  ?  Born and raised in [place], [place] by parents. Pt has one younger sister. Pt has a Best boy. Pt worked from 1999-2006. She stopped due to MS and is currently on disability.   ?   ?  Married for less than 1 yr in 1999 that ended in divorce.  ?   ? Caffeine Use: 1 20oz soda daily  ?   ? Lives with mom and daughter  ? ?Social Determinants of Health  ? ?Financial Resource Strain: Not on file  ?Food Insecurity: Not on file  ?Transportation Needs: Not on file  ?Physical Activity: Not on file  ?Stress: Not on file  ?Social Connections: Not on file  ?Intimate Partner Violence: Not on file  ?  ? ?Physical Exam  ? ?Vitals:  ? 03/27/21 0635  ?BP: (!) 156/103  ?Pulse: 81  ?Resp: 18  ?Temp: 98.1 ?F (36.7 ?C)  ?SpO2: 99%  ?  ?CONSTITUTIONAL: Well-appearing, NAD ?NEURO/PSYCH:  Alert and oriented x 3, no focal deficits ?EYES:  eyes equal and reactive ?ENT/NECK:  no LAD, no JVD; erythema to the posterior oropharynx ?CARDIO: Regular rate, well-perfused, normal S1 and S2 ?PULM:  CTAB no wheezing or rhonchi ?GI/GU:  non-distended, non-tender ?MSK/SPINE:  No gross deformities, no edema ?SKIN:  no rash, atraumatic ? ? ?*Additional and/or pertinent findings included in MDM below ? ?Diagnostic and Interventional Summary  ? ? EKG Interpretation ? ?Date/Time:  Tuesday March 27 2021 06:51:59 EDT ?Ventricular Rate:  94 ?PR  Interval:  179 ?QRS Duration: 86 ?QT Interval:  359 ?QTC Calculation: 449 ?R Axis:   84 ?Text Interpretation: Sinus rhythm no acute ST/T changes similar to June? 2022 Confirmed by Sherwood Gambler [phone]) on 03/27/2021

## 2021-03-27 NOTE — ED Provider Notes (Signed)
Care transferred to me.  Patient's chest x-ray has been personally reviewed and there is no obvious pneumonia.  COVID/flu/strep testing is all negative.  Likely a viral illness.  No indication for antibiotics which we discussed.  Will discharge home with return precautions. ?  ?Sherwood Gambler, MD ?03/27/21 [phone] ? ?

## 2021-03-27 NOTE — ED Notes (Signed)
Lab contacted for pending labs. The patient's lab have not been started yet due to shift change. ?

## 2021-03-27 NOTE — ED Triage Notes (Signed)
C/o sore throat, chills, fever, pressure in chest that started on the 16th. ?

## 2021-04-01 ENCOUNTER — Encounter: Payer: Self-pay | Admitting: Neurology

## 2021-04-02 ENCOUNTER — Other Ambulatory Visit: Payer: Self-pay | Admitting: Neurology

## 2021-04-02 ENCOUNTER — Other Ambulatory Visit: Payer: Self-pay

## 2021-04-02 DIAGNOSIS — G43109 Migraine with aura, not intractable, without status migrainosus: Secondary | ICD-10-CM

## 2021-04-02 MED ORDER — TOPIRAMATE 100 MG PO TABS: ORAL_TABLET | ORAL | 0 refills | Status: DC

## 2021-04-02 MED ORDER — DIVALPROEX SODIUM 500 MG PO DR TAB: 500.0000 mg | DELAYED_RELEASE_TABLET | Freq: Every day | ORAL | 0 refills | Status: DC

## 2021-04-02 NOTE — Telephone Encounter (Signed)
Pt called in wanting to make sure this letter was sent to the [facility] for her? ?

## 2021-04-03 ENCOUNTER — Encounter: Payer: Self-pay | Admitting: Neurology

## 2021-04-03 NOTE — Telephone Encounter (Signed)
Pt called left a voice mail her letter had been faxed to the [facility]  ?

## 2021-04-03 NOTE — Telephone Encounter (Signed)
Note done, pls fax to number she gave, thanks ?

## 2021-04-11 ENCOUNTER — Telehealth: Payer: Self-pay | Admitting: Neurology

## 2021-04-11 NOTE — Telephone Encounter (Signed)
Pt called no answer left a voice mail that her DMV paper work was refaxed to [phone] ?

## 2021-04-11 NOTE — Telephone Encounter (Signed)
Wing said the DMV did not get letter. Here is another fax number: ?[phone] ?Put ATTN MEDICAL on it. ?She said please call her and let her know it was sent.  ?

## 2021-04-17 ENCOUNTER — Ambulatory Visit (INDEPENDENT_AMBULATORY_CARE_PROVIDER_SITE_OTHER): Payer: Medicare Other | Admitting: Gastroenterology

## 2021-04-17 VITALS — BP 140/90 | HR 105 | Temp 97.8°F | Ht 64.0 in | Wt 250.8 lb

## 2021-04-17 DIAGNOSIS — R110 Nausea: Secondary | ICD-10-CM

## 2021-04-17 NOTE — Progress Notes (Signed)
? ? ?Gastroenterology Office Note   ? ? ?Primary Care Physician:  Wilburt Finlay, MD  ?Primary Gastroenterologist: Dr. Gala Romney ? ? ?Chief Complaint  ? ?Chief Complaint  ?Patient presents with  ? Follow-up  ? ? ? ?History of Present Illness  ? ?Emily [name] is a 41 y.o. female presenting today in follow-up with a history of chronic nausea, present since MS diagnosis. Remote history of EGD at [facility] 15 years ago. Previously on Zofran, Reglan, Compazine, Phenergan but without relief. Marinol has helped in the past. GES normal in the past. We had recommended EGD, but she has had numerous other health issues going on. ? ?Quality of life had been extremely affected due to N/V until she was started on Marinol, now at TID dosing. She is able to pursue EGD now. No abdominal pain. Taking omeprazole BID and Pepcid prn. No constipation/diarrhea. No rectal bleeding. She is feeling much improved.  ? ?Past Medical History:  ?Diagnosis Date  ? ADHD   ? Anxiety   ? Asthma as a child  ? Chronic back pain   ? Chronic pain   ? Conversion disorder   ? DDD (degenerative disc disease), cervical   ? Depression   ? External hemorrhoid   ? Fatty liver   ? Fibromyalgia   ? GERD (gastroesophageal reflux disease)   ? History of kidney stones   ? passed stones  ? HTN (hypertension) 02/27/2015  ? JC virus antibody positive   ? Migraines   ? MS (multiple sclerosis) ([place]) 06/2006  ? Neuromuscular disorder ([place])   ? MS  ? Neuropathy   ? OA (osteoarthritis)   ? Ovarian cyst   ? Panic attack   ? Perforated bowel ([place]) 2009  ? colostomy bag for 3 motnhs  ? Pneumonia   ? Pseudoseizures ([place])   ? last seizure was in 08/2018   ? PTSD (post-traumatic stress disorder)   ? PTSD (post-traumatic stress disorder)   ? S/P emergency C-section   ? Seasonal allergies   ? Urinary urgency   ? Vertigo   ? Wears contact lenses   ? Wears glasses   ? ? ?Past Surgical History:  ?Procedure Laterality Date  ? ABDOMINAL SURGERY    ? ABDOMINAL WOUND EXPLORATION    03/19/2019  ? Abdominal Wound Exploration with Explantation of Mesh  ? APPENDECTOMY    ? BOWEL RESECTION  01/2007  ? with colostomy  ? BOWEL RESECTION N/A 06/14/2019  ? Procedure: SMALL BOWEL RESECTION;  Surgeon: Ralene Ok, MD;  Location: [place];  Service: General;  Laterality: N/A;  ? CESAREAN SECTION N/A 07/12/2015  ? Procedure: CESAREAN SECTION;  Surgeon: Guss Bunde, MD;  Location: [place];  Service: Obstetrics;  Laterality: N/A;  ? COLON SURGERY    ? part of colon and intestines removed; colostomy 2008  ? COLONOSCOPY WITH PROPOFOL N/A 01/29/2017  ? Procedure: COLONOSCOPY WITH PROPOFOL;  Surgeon: Ileana Roup, MD;  Location: [facility];  Service: General;  Laterality: N/A;  ? COLOSTOMY CLOSURE  04/2007  ? EVALUATION UNDER ANESTHESIA WITH HEMORRHOIDECTOMY N/A 06/11/2018  ? Procedure: ANORECTAL EXAM UNDER ANESTHESIA WITH HEMORRHOIDECTOMY;  Surgeon: Ileana Roup, MD;  Location: [place];  Service: General;  Laterality: N/A;  ? EXCISIONAL HEMORRHOIDECTOMY    ? EXTREMITY CYST EXCISION  1994  ? right leg  ? HERNIA REPAIR    ? INCISIONAL HERNIA REPAIR N/A 02/16/2016  ? Procedure: HERNIA REPAIR INCISIONAL;  Surgeon: Fanny Skates, MD;  Location: [facility];  Service: General;  Laterality: N/A;  ? INSERTION OF MESH  02/16/2016  ? Procedure: INSERTION OF MESH;  Surgeon: Fanny Skates, MD;  Location: [facility];  Service: General;;  ? LAPAROSCOPY N/A 06/14/2019  ? Procedure: ATTEMPTED DIAGNOSTIC LAPAROSCOPY;  Surgeon: Ralene Ok, MD;  Location: [place];  Service: General;  Laterality: N/A;  ? LAPAROTOMY  03/19/2019  ? Procedure: Abdominal Wound Exploration with Explantation of Mesh;  Surgeon: Ralene Ok, MD;  Location: [place];  Service: General;;  ? LAPAROTOMY N/A 06/14/2019  ? Procedure: EXPLORATORY LAPAROTOMY;  Surgeon: Ralene Ok, MD;  Location: [place];  Service: General;  Laterality: N/A;  ? LYSIS OF ADHESION N/A 06/14/2019  ? Procedure: LYSIS OF ADHESIONS;  Surgeon:  Ralene Ok, MD;  Location: [place];  Service: General;  Laterality: N/A;  ? RADIOLOGY WITH ANESTHESIA N/A 03/06/2017  ? Procedure: MRI WITH ANESTHESIA OF CERVICAL SPINE WITHOUT CONTRAST, MRI OF LUMBAR SPINE WITHOUT CONTRAST;  Surgeon: Radiologist, Medication, MD;  Location: [place];  Service: Radiology;  Laterality: N/A;  ? RADIOLOGY WITH ANESTHESIA N/A 09/15/2018  ? Procedure: MRI OF BRAIN WITH AND WITHOUT CONTRAST;  Surgeon: Radiologist, Medication, MD;  Location: [place];  Service: Radiology;  Laterality: N/A;  ? RADIOLOGY WITH ANESTHESIA N/A 10/13/2018  ? Procedure: MRI OF BRAIN WITH AND WITHOUT CONTRAST;  Surgeon: Radiologist, Medication, MD;  Location: [place];  Service: Radiology;  Laterality: N/A;  ? RADIOLOGY WITH ANESTHESIA N/A 10/26/2018  ? Procedure: MRI UNDER ANESTHESIA; HEAD, CERVICAL AND THORASCIC SPINE;  Surgeon: Radiologist, Medication, MD;  Location: [place];  Service: Radiology;  Laterality: N/A;  ? RADIOLOGY WITH ANESTHESIA N/A 06/08/2020  ? Procedure: MRI WITH ANESTHESIA BRAIN WITH AND WITHOUT CONTRAST.,  CERVICAL BRAIN WITH AND WITHOUT CONTRAST, THORACIC SPINE WITH AND WITHOUT;  Surgeon: Radiologist, Medication, MD;  Location: [place];  Service: Radiology;  Laterality: N/A;  ? SCAR REVISION  01/21/2011  ? Procedure: SCAR REVISION;  Surgeon: Hermelinda Dellen;  Location: [place];  Service: Plastics;  Laterality: N/A;  exploration of scar of abdomen and repair of defect  ? TOOTH EXTRACTION Left 10/2016  ? TRANSRECTAL DRAINAGE OF PELVIC ABSCESS    ? ? ?Current Outpatient Medications  ?Medication Sig Dispense Refill  ? albuterol (PROVENTIL HFA;VENTOLIN HFA) 108 (90 Base) MCG/ACT inhaler Inhale 2 puffs into the lungs every 6 (six) hours as needed for wheezing or shortness of breath. (Patient taking differently: Inhale 2 puffs into the lungs every 4 (four) hours as needed for wheezing or shortness of breath.) 1 Inhaler 0  ? amLODipine (NORVASC) 5 MG tablet Take 5 mg by mouth daily.   2  ?  amphetamine-dextroamphetamine (ADDERALL XR) 10 MG 24 hr capsule Take 10 mg by mouth daily.    ? cariprazine (VRAYLAR) capsule Take 3 mg by mouth at bedtime.    ? divalproex (DEPAKOTE) 500 MG DR tablet Take 1 tablet (500 mg total) by mouth at bedtime. 30 tablet 0  ? dronabinol (MARINOL) 2.5 MG capsule TAKE 1 CAPSULE BY MOUTH 3 TIMES DAILY WITH MEALS. 90 capsule 3  ? EPINEPHrine 0.3 mg/0.3 mL IJ SOAJ injection Inject 0.3 mg into the muscle as needed for anaphylaxis.     ? famotidine (PEPCID) 40 MG tablet Take 1 tablet (40 mg total) by mouth at bedtime. 90 tablet 3  ? guanFACINE (TENEX) 1 MG tablet Take 1 mg by mouth daily.    ? loratadine (CLARITIN) 10 MG tablet Take 10 mg by mouth daily as  needed for allergies.    ? medroxyPROGESTERone (DEPO-PROVERA) 150 MG/ML injection Inject 150 mg into the muscle every 3 (three) months.    ? minocycline (MINOCIN) 50 MG capsule Take 1 capsule (50 mg total) by mouth 2 (two) times daily. 60 capsule 1  ? Naphazoline-Glycerin (CLEAR EYES REDNESS RELIEF OP) Place 1 drop into both eyes daily as needed (irrtiation).    ? omeprazole (PRILOSEC) 20 MG capsule Take 1 capsule (20 mg total) by mouth 2 (two) times daily before a meal. 180 capsule 3  ? oxycodone (OXY-IR) 5 MG capsule Take 5 mg by mouth in the morning and at bedtime.    ? topiramate (TOPAMAX) 100 MG tablet Take by mouth.    ? topiramate (TOPAMAX) 100 MG tablet Take 1.5 tablets twice a day 90 tablet 0  ? traZODone (DESYREL) 50 MG tablet Take 100 mg by mouth at bedtime.    ? Minocycline HCl Micronized (AMZEEQ) 4 % FOAM Apply 1 application topically daily. (Patient not taking: Reported on 04/17/2021) 30 g 1  ? pregabalin (LYRICA) 225 MG capsule Take 1 capsule (225 mg total) by mouth 2 (two) times daily. (Patient taking differently: '225mg'$  in am, '150mg'$  at lunch and '225mg'$  at night) 60 capsule 2  ? triamcinolone ointment (KENALOG) 0.1 % Apply topically. (Patient not taking: Reported on 04/17/2021)    ? ?No current facility-administered  medications for this visit.  ? ?Facility-Administered Medications Ordered in Other Visits  ?Medication Dose Route Frequency Provider Last Rate Last Admin  ? metoCLOPramide (REGLAN) injection 10 mg  10 mg Intravenous Once Z

## 2021-04-17 NOTE — Patient Instructions (Addendum)
We are arranging an upper endoscopy with Dr. Gala Romney in the near future! ? ?Further recommendations to follow! ? ?I enjoyed seeing you again today! As you know, I value our relationship and want to provide genuine, compassionate, and quality care. I welcome your feedback. If you receive a survey regarding your visit,  I greatly appreciate you taking time to fill this out. See you next time! ? ?Annitta Needs, PhD, ANP-BC ?[facility]  ? ? ? ?

## 2021-04-24 ENCOUNTER — Telehealth: Payer: Self-pay | Admitting: *Deleted

## 2021-04-24 ENCOUNTER — Encounter: Payer: Self-pay | Admitting: *Deleted

## 2021-04-24 NOTE — Telephone Encounter (Signed)
Called pt. She has been scheduled for EGD with Dr. Gala Romney, asa 3 on 5/26 at 11:30am. Aware will mail instructions with pre-op appt. ?

## 2021-05-15 DIAGNOSIS — F319 Bipolar disorder, unspecified: Secondary | ICD-10-CM

## 2021-05-15 HISTORY — DX: Bipolar disorder, unspecified: F31.9

## 2021-05-26 ENCOUNTER — Other Ambulatory Visit: Payer: Self-pay | Admitting: Neurology

## 2021-05-26 DIAGNOSIS — G43109 Migraine with aura, not intractable, without status migrainosus: Secondary | ICD-10-CM

## 2021-05-28 ENCOUNTER — Encounter: Payer: Self-pay | Admitting: Neurology

## 2021-05-28 ENCOUNTER — Ambulatory Visit (INDEPENDENT_AMBULATORY_CARE_PROVIDER_SITE_OTHER): Payer: Medicare Other | Admitting: Neurology

## 2021-05-28 ENCOUNTER — Telehealth: Payer: Self-pay | Admitting: Pharmacy Technician

## 2021-05-28 ENCOUNTER — Other Ambulatory Visit (HOSPITAL_COMMUNITY): Payer: Self-pay

## 2021-05-28 VITALS — BP 144/101 | HR 111 | Resp 20 | Ht 64.0 in | Wt 246.0 lb

## 2021-05-28 DIAGNOSIS — G43109 Migraine with aura, not intractable, without status migrainosus: Secondary | ICD-10-CM

## 2021-05-28 DIAGNOSIS — G43709 Chronic migraine without aura, not intractable, without status migrainosus: Secondary | ICD-10-CM | POA: Diagnosis not present

## 2021-05-28 DIAGNOSIS — G35 Multiple sclerosis: Secondary | ICD-10-CM | POA: Diagnosis not present

## 2021-05-28 DIAGNOSIS — F445 Conversion disorder with seizures or convulsions: Secondary | ICD-10-CM | POA: Diagnosis not present

## 2021-05-28 MED ORDER — TOPIRAMATE 100 MG PO TABS: ORAL_TABLET | ORAL | 3 refills | Status: DC

## 2021-05-28 MED ORDER — DIVALPROEX SODIUM 500 MG PO DR TAB: 500.0000 mg | DELAYED_RELEASE_TABLET | Freq: Every day | ORAL | 3 refills | Status: DC

## 2021-05-28 NOTE — Patient Instructions (Signed)
20    Your procedure is scheduled on: 06/01/2021  Report to [facility] Entr[facility] at  10:00   AM.  Call this number if you have problems the morning of surgery: [phone]   Remember:   Follow instructions on letter from office regarding when to stop eating and drinking        No Smoking the day of procedure      Take these medicines the morning of surgery with A SIP OF WATER: Amlodipine, Depakote, Pepcid, Omeprazole, Lyrica, and Topamax   Do not wear jewelry, make-up or nail polish.  Do not wear lotions, powders, or perfumes. You may wear deodorant.                Do not bring valuables to the hospital.  Contacts, dentures or bridgework may not be worn into surgery.  Leave suitcase in the car. After surgery it may be brought to your room.  For patients admitted to the hospital, checkout time is 11:00 AM the day of discharge.   Patients discharged the day of surgery will not be allowed to drive home. Upper Endoscopy, Adult Upper endoscopy is a procedure to look inside the upper GI (gastrointestinal) tract. The upper GI tract is made up of: The part of the body that moves food from your mouth to your stomach (esophagus). The stomach. The first part of your small intestine (duodenum). This procedure is also called esophagogastroduodenoscopy (EGD) or gastroscopy. In this procedure, your health care provider passes a thin, flexible tube (endoscope) through your mouth and down your esophagus into your stomach. A small camera is attached to the end of the tube. Images from the camera appear on a monitor in the exam room. During this procedure, your health care provider may also remove a small piece of tissue to be sent to a lab and examined under a microscope (biopsy). Your health care provider may do an upper endoscopy to diagnose cancers of the upper GI tract. You may also have this procedure to find the cause of other conditions, such as: Stomach pain. Heartburn. Pain or problems when  swallowing. Nausea and vomiting. Stomach bleeding. Stomach ulcers. Tell a health care provider about: Any allergies you have. All medicines you are taking, including vitamins, herbs, eye drops, creams, and over-the-counter medicines. Any problems you or family members have had with anesthetic medicines. Any blood disorders you have. Any surgeries you have had. Any medical conditions you have. Whether you are pregnant or may be pregnant. What are the risks? Generally, this is a safe procedure. However, problems may occur, including: Infection. Bleeding. Allergic reactions to medicines. A tear or hole (perforation) in the esophagus, stomach, or duodenum. What happens before the procedure? Staying hydrated Follow instructions from your health care provider about hydration, which may include: Up to 4 hours before the procedure - you may continue to drink clear liquids, such as water, clear fruit juice, black coffee, and plain tea.   Medicines Ask your health care provider about: Changing or stopping your regular medicines. This is especially important if you are taking diabetes medicines or blood thinners. Taking medicines such as aspirin and ibuprofen. These medicines can thin your blood. Do not take these medicines unless your health care provider tells you to take them. Taking over-the-counter medicines, vitamins, herbs, and supplements. General instructions Plan to have someone take you home from the hospital or clinic. If you will be going home right after the procedure, plan to have someone with you for  24 hours. Ask your health care provider what steps will be taken to help prevent infection. What happens during the procedure?  An IV will be inserted into one of your veins. You may be given one or more of the following: A medicine to help you relax (sedative). A medicine to numb the throat (local anesthetic). You will lie on your left side on an exam table. Your health care  provider will pass the endoscope through your mouth and down your esophagus. Your health care provider will use the scope to check the inside of your esophagus, stomach, and duodenum. Biopsies may be taken. The endoscope will be removed. The procedure may vary among health care providers and hospitals. What happens after the procedure? Your blood pressure, heart rate, breathing rate, and blood oxygen level will be monitored until you leave the hospital or clinic. Do not drive for 24 hours if you were given a sedative during your procedure. When your throat is no longer numb, you may be given some fluids to drink. It is up to you to get the results of your procedure. Ask your health care provider, or the department that is doing the procedure, when your results will be ready. Summary Upper endoscopy is a procedure to look inside the upper GI tract. During the procedure, an IV will be inserted into one of your veins. You may be given a medicine to help you relax. A medicine will be used to numb your throat. The endoscope will be passed through your mouth and down your esophagus. This information is not intended to replace advice given to you by your health care provider. Make sure you discuss any questions you have with your health care provider. Document Revised: 06/18/2017 Document Reviewed: 05/26/2017 Elsevier Patient Education  [place] After  Please read the instructions outlined below and refer to this sheet in the next few weeks. These discharge instructions provide you with general information on caring for yourself after you leave the hospital. Your doctor may also give you specific instructions. While your treatment has been planned according to the most current medical practices available, unavoidable complications occasionally  occur. If you have any problems or questions after discharge, please call your doctor. HOME CARE INSTRUCTIONS Activity You may resume your regular activity but move at a slower pace for the next 24 hours.  Take frequent rest periods for the next 24 hours.  Walking will help expel (get rid of) the air and reduce the bloated feeling in your abdomen.  No driving for 24 hours (because of the anesthesia (medicine) used during the test).  You may shower.  Do not sign any important legal documents or operate any machinery for 24 hours (because  of the anesthesia used during the test).  Nutrition Drink plenty of fluids.  You may resume your normal diet.  Begin with a light meal and progress to your normal diet.  Avoid alcoholic beverages for 24 hours or as instructed by your caregiver.  Medications You may resume your normal medications unless your caregiver tells you otherwise. What you can expect today You may experience abdominal discomfort such as a feeling of fullness or "gas" pains.  You may experience a sore throat for 2 to 3 days. This is normal. Gargling with salt water may help this.  Follow-up Your doctor will discuss the results of your test with you. SEEK IMMEDIATE MEDICAL CARE IF: You have excessive nausea (feeling sick to your stomach) and/or vomiting.  You have severe abdominal pain and distention (swelling).  You have trouble swallowing.  You have a temperature over 100 F (37.8 C).  You have rectal bleeding or vomiting of blood.  Document Released: 08/08/2003 Document Revised: 12/13/2010 Document Reviewed: 02/18/2007

## 2021-05-28 NOTE — Telephone Encounter (Signed)
Patient has been receiving through Medical benefit. Patient has Medicare and [place] Medicaid. Submitted benefits investigation through [employer]- [id number]     Awaiting Response.

## 2021-05-28 NOTE — Telephone Encounter (Signed)
-----   Message from Venetia Night, [place] sent at 05/24/2021  3:00 PM EDT ----- Regarding: PA- Renew  Botox 06/22/21 appt Good Afternoon, So sorry but I think almost all of my patients in June need to be renewed.

## 2021-05-28 NOTE — Progress Notes (Unsigned)
NEUROLOGY FOLLOW UP OFFICE NOTE  Emily [name] [id number] Jul 03, 1980  HISTORY OF PRESENT ILLNESS: I had the pleasure of seeing Emily [name] in follow-up in the neurology clinic on 05/28/2021.  The patient was last seen a year ago for psychogenic non-epileptic events, MS, migraines, neuropathy. She reports that from a neurological standpoint, she has been doing pretty good. She has not had any psychogenic non-epileptic events in 2 years. She has been managing her stress the best she can. She has started Ocrevus infusions for MS in 09/2020, last infusion was in 03/2021. She states that within 2 weeks of starting Ocrevus, "everything was back to normal," the only thing she is "stuck with" is pain going down her neck when she flexes her head down. She has also started Botox injections for chronic migraine, she still gets migraine towards the end of her Botox cycle, she may have one or the start of one then it goes away. She is also on Depakote '500mg'$  qhs and Topiramate for migraine prophylaxis, reporting she takes '100mg'$  TID (instead of '150mg'$  BID). She now goes to [facility] and takes Cymbalta. She reports having "sciatica in my left arm and left hip" and will be getting cervical injections. She is dealing with infected ingrown toenails. She has an upcoming endoscopy scheduled. She has always had problems with urinary incontinence and used to get "pee pads" from [facility] but lost this service.   Her last MRI brain, cervical, thoracic spine with and without contrast done 06/08/20 under sedation showed no enhancing lesions seen. Compared to 2020 imaging, there was a new lesion within the right aspect of the spinal cord at T4 and small focus of signal abnormality within the central cord at T9. Similar patchy signal changes were seen at T6,T7, while T10 and 11 appears slightly progressed. Signal changes at C2-3, C4 were unchanged, brain MRI showed similar signal changes perpendicular to  the lateral ventricles, callososeptal interface with no changes from 2020. Degenerative changes were seen at C5-6 and C6-7 with moderate disc degeneration, posterior osteophyte complex contributes to multifactorial moderate spinal stenosis with mild spinal cord flatterning and mild left neural foraminal narrowing at C5-6, moderate right and moderate/severe left neural foraminal narrowing at C6-7.    History on Initial Assessment 03/28/2015: This is a pleasant 41 yo RH woman with a history of relapsing-remitting multiple sclerosis, headaches, anxiety with panic attacks, conversion disorder with seizures. She presented at [redacted] weeks pregnant to determine if diazepam for anxiety/seizures would be a medication she can use, understanding this is pregnancy category D versus risks of injuring herself from falls when she has the psychogenic seizures. Records from her previous neurologists at [facility] were reviewed. She was diagnosed with MS in June 2008 while living in [place] state, she started having right thigh pain and right eye blurred vision. She was diagnosed with right optic neuritis and underwent MRI brain and lumbar puncture which confirmed a diagnosis of MS. 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 She has been on Gilenya since 2014 without any relapse. This was discontinued in December 2016 when she found out she was pregnant. She has a history of chronic pain and migraines, and was taking Depakote and Topamax for headache prophylaxis, also stopped in December 2016. She reports having Botox for her migraines, but so far migraines are not as bad as  they were.  She started having shaking episodes in April 2016. Initially Depakote dose was increased to '500mg'$  BID. She was back in May 2016 with multiple episodes of syncope, palpitations, a sensation of her heart  jumping out of her chest. She also reported lightheadedness, facial numbness, and numbness in her hands and feet. On her last visit in June 2016 at [facility], shaking episodes were discussed. Events always occur after a stressful event. She had a spell in [place] where she had a normal EEG and was diagnosed with a conversion disorder. She had seen Dr. Darleene Cleaver and reportedly told her he could not help her because "her case was too complex." She started seeing a psychiatrist in [place], however since she switched neurologists, she reports that he cannot see her anymore. She has not been able to establish care with [facility] and continues to have anxiety and panic attacks "all the time." She is diaphoretic in the office today and reports she always feels anxious. When she starts having one of her seizures, she would have significant anxiety, "my body can't deal and goes into a seizure," she hears her heartbeat in her ears. If it slowly builds up, she can get herself to a safe place, however other times it "just hits me," and she falls to the ground and has injured herself. She reports falling out one time in the bathroom during a doctor visit. She is currently on Fluoxetine and Buspirone. Clonazepam in the past did not help. She would usually go to the ER for these spells, which would quiet down once she gets Benadryl and Diazepam.  She delivered early at [redacted] weeks gestation last July 12, 2015. A week after delivery, she started having numbness on the top of both feet, pain in the balls of her feet, with numbness traveling up to her mid-back. She also has numbness in both forearms, and reports her palms feel the same as her soles, with constant stabbing burning pain. She had these symptoms pre-pregnancy but states the numbness only went up to her knees. During pregnancy, all her symptoms were less bothersome. She has been following with Pain management in [facility] and would occasionally take Oxycodone prior  to her pregnancy.  Diagnostic Data:   I personally reviewed MRI brain with and without contrast done with [facility] last 10/02/13 which did not show any acute changes, there was multiple T2/FLAIR lesions in the subcortical and deep white matter regions, no abnormal enhancement. Hippocampi appear asymmetric, smaller on the right without abnormal signal or enhancement.   MRI C-spine with and without contrast 10/18/13 showed hazy T2 hyperintensity within the cord from C2-3 to C4-5 without abnormal enhancement.  per [facility] - MRI brain done 03/18/14: "No change in numerous multifocal T2 hyperintense white matter lesions relative to prior MRI from 04/17/2013 most consistent with demyelinating lesions given history of multiple sclerosis. No evidence of active demyelination."  MRI cervical spine done 03/18/14: "Relative to prior MRI from 10/20/2009, no change in patchy foci of increased T2 signal in the dorsal cord at C2- C4 in keeping with demyelinating lesions in this patient with a clinical history of multiple sclerosis.   No evidence of enhancement to suggest active demyelination. Moderately advanced degenerative disease at C5-C6 and C6-C7 with interval development of left eccentric central, foraminal entry zone and foraminal disc extrusion at C6-C7 exerting mass effect on the exiting C7 and descending C8 roots.  MRI brain and C-spine 07/30/2015 with and without contrast : There was no abnormal enhancement.  There were no changes from previous MRI brain in 09/2013 with multiple bilateral subcortical greater than 10 periventricular T2 hyperintensities. There were no cord lesions seen up to T3-4. There was chronic degenerative disc disease and central canal stenosis at C5-6 and C6-7, no abnormal cord signal, unchanged from 06/2014.  MRI brain, cervical and thoracic spine with and without contrast in October 2020 did not show any new lesions, no abnormal enhancement. Stable findings with multiple scatted foci of FLAIR  signal in the periventricular, deep, and juxtacortical white matter, subtle patchy cord signal abnormality at C2-4. She has degenerative disc osteophyte at C6-7 with moderate spinal stenosis, mild mass effect on the left greater than right spinal cord, advanced degenerative disc osteophyte at C5-6 with mild spinal stenosis and mild mass effect on left hemicord. Thoracic spine showed question subtle patchy cord signal abnormality at level of T6-7, T8-9, T9-10, no active demyelination. Mild degenerative spondylosis with no stenosis.  EMG/NCV done on both UE and right LE showed bilateral carpal tunnel syndrome, mild to moderate in degree, no evidence of polyneuropathy or radiculopathy on the right side.  PAST MEDICAL HISTORY: Past Medical History:  Diagnosis Date   ADHD    Anxiety    Asthma as a child   Chronic back pain    Chronic pain    Conversion disorder    DDD (degenerative disc disease), cervical    Depression    External hemorrhoid    Fatty liver    Fibromyalgia    GERD (gastroesophageal reflux disease)    History of kidney stones    passed stones   HTN (hypertension) 02/27/2015   JC virus antibody positive    Migraines    MS (multiple sclerosis) ([place]) 06/2006   Neuromuscular disorder (HCC)    MS   Neuropathy    OA (osteoarthritis)    Ovarian cyst    Panic attack    Perforated bowel ([facility]) 2009   colostomy bag for 3 motnhs   Pneumonia    Pseudoseizures    last seizure was in 08/2018    PTSD (post-traumatic stress disorder)    PTSD (post-traumatic stress disorder)    S/P emergency C-section    Seasonal allergies    Urinary urgency    Vertigo    Wears contact lenses    Wears glasses     MEDICATIONS: Current Outpatient Medications on File Prior to Visit  Medication Sig Dispense Refill   albuterol (PROVENTIL HFA;VENTOLIN HFA) 108 (90 Base) MCG/ACT inhaler Inhale 2 puffs into the lungs every 6 (six) hours as needed for wheezing or shortness of breath. 1 Inhaler 0    amLODipine (NORVASC) 5 MG tablet Take 5 mg by mouth daily.   2   amphetamine-dextroamphetamine (ADDERALL XR) 10 MG 24 hr capsule Take 10 mg by mouth daily.     cariprazine (VRAYLAR) capsule Take 3 mg by mouth at bedtime.     cephALEXin (KEFLEX) 500 MG capsule Take 500 mg by mouth 2 (two) times daily.     divalproex (DEPAKOTE) 500 MG DR tablet Take 1 tablet (500 mg total) by mouth at bedtime. 30 tablet 0   dronabinol (MARINOL) 2.5 MG capsule TAKE 1 CAPSULE BY MOUTH 3 TIMES DAILY WITH MEALS. 90 capsule 3   famotidine (PEPCID) 40 MG tablet Take 1 tablet (40 mg total) by mouth at bedtime. 90 tablet 3   guanFACINE (TENEX) 1 MG tablet Take 1 mg by mouth at bedtime.     loratadine (CLARITIN) 10 MG tablet Take  10 mg by mouth daily as needed for allergies.     medroxyPROGESTERone (DEPO-PROVERA) 150 MG/ML injection Inject 150 mg into the muscle every 3 (three) months.     Naphazoline-Glycerin (CLEAR EYES REDNESS RELIEF OP) Place 1 drop into both eyes 3 (three) times daily as needed (irrtiation).     omeprazole (PRILOSEC) 20 MG capsule Take 1 capsule (20 mg total) by mouth 2 (two) times daily before a meal. 180 capsule 3   oxycodone (OXY-IR) 5 MG capsule Take 5 mg by mouth in the morning and at bedtime.     pregabalin (LYRICA) 225 MG capsule Take 1 capsule (225 mg total) by mouth 2 (two) times daily. 60 capsule 2   pregabalin (LYRICA) 50 MG capsule Take 150 mg by mouth daily with lunch.     topiramate (TOPAMAX) 100 MG tablet Take 1.5 tablets twice a day (Patient taking differently: Take 100 mg by mouth daily.) 90 tablet 0   traZODone (DESYREL) 50 MG tablet Take 100 mg by mouth at bedtime.     EPINEPHrine 0.3 mg/0.3 mL IJ SOAJ injection Inject 0.3 mg into the muscle as needed for anaphylaxis.  (Patient not taking: Reported on 05/28/2021)     minocycline (MINOCIN) 50 MG capsule Take 1 capsule (50 mg total) by mouth 2 (two) times daily. (Patient not taking: Reported on 05/25/2021) 60 capsule 1   rosuvastatin  (CRESTOR) 5 MG tablet Take 5 mg by mouth daily. (Patient not taking: Reported on 05/28/2021)     Current Facility-Administered Medications on File Prior to Visit  Medication Dose Route Frequency Provider Last Rate Last Admin   metoCLOPramide (REGLAN) injection 10 mg  10 mg Intravenous Once Milton Ferguson, MD        ALLERGIES: Allergies  Allergen Reactions   Baclofen Hives and Shortness Of Breath   Duloxetine Hcl Shortness Of Breath and Rash   Gabapentin Shortness Of Breath and Rash   Monosodium Glutamate Anaphylaxis   Other Shortness Of Breath, Rash and Other (See Comments)    MSG, beans (vomiting) MSG causes hives, itching, throat swelling   Acetaminophen Nausea And Vomiting    Projectile vomiting   Alprazolam Other (See Comments)    Lethargy   Amitriptyline Hypertension   Divalproex Sodium Diarrhea, Nausea And Vomiting and Nausea Only   Duloxetine Rash   Magnesium Salicylate Hives and Itching   Modafinil Other (See Comments)    Seizures    Rizatriptan Nausea And Vomiting and Other (See Comments)    GI upset, Projectile vomiting    Tizanidine Hives    GI Upset (intolerance)   Adhesive [Tape] Other (See Comments)    Skin irritation Paper tape is ok   Hydrocodone-Acetaminophen Hives and Nausea And Vomiting    Projectile vomiting   Ketorolac Tromethamine Nausea Only    Toradol    Lamotrigine Rash   Nsaids Nausea And Vomiting    Irritate stomach    Tramadol Nausea And Vomiting    GI Upset (intolerance)    FAMILY HISTORY: Family History  Problem Relation Age of Onset   Diabetes Mother    Hypertension Mother    Diabetes Father    Hypertension Father    Arthritis Father    Cancer Maternal Grandmother    Cancer Maternal Grandfather    Alcohol abuse Neg Hx    Anxiety disorder Neg Hx    Bipolar disorder Neg Hx    Drug abuse Neg Hx    Depression Neg Hx    Colon cancer Neg Hx  SOCIAL HISTORY: Social History   Socioeconomic History   Marital status: Single     Spouse name: Not on file   Number of children: 1   Years of education: College   Highest education level: Not on file  Occupational History    Employer: OTHER    Comment: disability  Tobacco Use   Smoking status: Former    Packs/day: 0.25    Years: 10.00    Pack years: 2.50    Types: Cigarettes    Quit date: 07/12/2015    Years since quitting: 5.8   Smokeless tobacco: Never  Vaping Use   Vaping Use: Never used  Substance and Sexual Activity   Alcohol use: No    Alcohol/week: 0.0 standard drinks   Drug use: No    Comment: marinol that shows up at thc   Sexual activity: Not Currently    Partners: Male    Birth control/protection: Injection    Comment: Depo inj - next due 07/2020  Other Topics Concern   Not on file  Social History Narrative    Born and raised in [place], [place] by parents. Pt has one younger sister. Pt has a Best boy. Pt worked from 1999-2006. She stopped due to MS and is currently on disability.        Married for less than 1 yr in 1999 that ended in divorce.      Caffeine Use: 1 20oz soda daily      Lives with mom and daughter   Social Determinants of Health   Financial Resource Strain: Not on file  Food Insecurity: Not on file  Transportation Needs: Not on file  Physical Activity: Not on file  Stress: Not on file  Social Connections: Not on file  Intimate Partner Violence: Not on file     PHYSICAL EXAM: Vitals:   05/28/21 1413  BP: (!) 144/101  Pulse: (!) 111  Resp: 20  SpO2: 97%   General: No acute distress Head:  Normocephalic/atraumatic Skin/Extremities: No rash, no edema Neurological Exam: alert and awake. No aphasia or dysarthria. Fund of knowledge is appropriate.  Attention and concentration are normal.   Cranial nerves: Pupils equal, round. Extraocular movements intact with no nystagmus. Visual fields full.  No facial asymmetry.  Motor: Bulk and tone normal, due to pain, she has difficulty with left arm abduction  above shoulder level, 4-/5 left UE, otherwise 5/5. Finger to nose testing intact.  Gait narrow-based and steady, no ataxia.    IMPRESSION: This is a 41 yo RH woman with a history of relapsing-remitting multiple sclerosis, migraine with aura, anxiety with panic attacks, conversion disorder with psychogenic seizures. From a neurological standpoint, she is doing much better at this point compared to prior visits. Migraines better controlled since starting Botox, she is also on Depakote '500mg'$  qhs and Topiramate '100mg'$  TID, refills sent. She is also feeling much better from an MS-standpoint since starting Ocrevus infusions in 09/2020. Annual interval MRI brain, cervical, and thoracic MRI will be ordered, she needs sedation for these studies. She has not had any psychogenic non-epileptic events in 2 years. Continue follow-up with Pain Management. Follow-up in 6 months, call for any changes.   Thank you for allowing me to participate in her care.  Please do not hesitate to call for any questions or concerns.    Ellouise Newer, M.D.   CC: Dr. Ileana Roup

## 2021-05-28 NOTE — Patient Instructions (Addendum)
Good to see you.  Continue Topiramate '100mg'$  three times a day and Depakote '500mg'$  every night  2. Continue Botox and Ocrevus  3. Follow-up with [facility]  4. Follow-up in 6 months, call for any changes

## 2021-05-29 ENCOUNTER — Telehealth: Payer: Self-pay

## 2021-05-29 NOTE — Telephone Encounter (Signed)
Pt called office, she spoke to endo nurse about EGD ASA 3 scheduled for 06/01/21 at 11:30am. [place] 2:00pm. Endo nurse told her if doctor is running behind day of procedure she may not be discharged until 2:00pm. Pt needs earlier procedure time and needs to reschedule. Informed her we will cancel procedure and call back to reschedule when Dr. [name] next schedule is available. Endo scheduler informed.

## 2021-05-29 NOTE — Telephone Encounter (Signed)
[name] NP advised ok for pt to proceed with procedure. Called and informed pt.

## 2021-05-29 NOTE — Telephone Encounter (Signed)
Received updated benefits from [employer]- SR #:  [id number]. Patient good to continue Medical Buy/Bill

## 2021-05-29 NOTE — Telephone Encounter (Signed)
Pt called office and LMOVM. She is scheduled for EGD 06/01/21. She took antibiotic for infection to toenail. She has been started on another 10 day round of antibiotic because culture showed staph infection. She wants to know if she can still have procedure 06/01/21.

## 2021-05-30 ENCOUNTER — Encounter (HOSPITAL_COMMUNITY): Payer: Self-pay

## 2021-05-30 ENCOUNTER — Encounter (HOSPITAL_COMMUNITY)
Admission: RE | Admit: 2021-05-30 | Discharge: 2021-05-30 | Disposition: A | Discharge status: Home / Self Care | Payer: Medicare Other | Source: Ambulatory Visit | Attending: Internal Medicine | Admitting: Internal Medicine

## 2021-05-30 ENCOUNTER — Other Ambulatory Visit: Payer: Self-pay

## 2021-05-30 DIAGNOSIS — Z01818 Encounter for other preprocedural examination: Secondary | ICD-10-CM

## 2021-05-30 DIAGNOSIS — G35 Multiple sclerosis: Secondary | ICD-10-CM

## 2021-05-31 ENCOUNTER — Encounter: Payer: Self-pay | Admitting: *Deleted

## 2021-05-31 NOTE — Telephone Encounter (Signed)
Called pt. She has been rescheduled to 7/20 at 9:30am. Aware will send new instructions/pre-op appt

## 2021-06-01 ENCOUNTER — Encounter (HOSPITAL_COMMUNITY): Admission: RE | Payer: Self-pay | Source: Home / Self Care

## 2021-06-01 ENCOUNTER — Ambulatory Visit (HOSPITAL_COMMUNITY): Admission: RE | Admit: 2021-06-01 | Payer: Medicare Other | Source: Home / Self Care | Admitting: Internal Medicine

## 2021-06-01 SURGERY — ESOPHAGOGASTRODUODENOSCOPY (EGD) WITH PROPOFOL
Anesthesia: Monitor Anesthesia Care

## 2021-06-05 ENCOUNTER — Telehealth: Payer: Self-pay | Admitting: Neurology

## 2021-06-05 NOTE — Telephone Encounter (Signed)
[place]. She probably should let her foot doctor and PCP know about these. Pls confirm that she will be able to do the MRI scheduled on 6/27 so we can set up pre-MRI virtual visit, thanks

## 2021-06-05 NOTE — Telephone Encounter (Signed)
Pt is going to call her PCP about her toe and WBC being up she stated that her foot doctor told her to call us, she stated she will be able to do her MRI on 6/27 she has someone to keep her daughter  for her,

## 2021-06-05 NOTE — Telephone Encounter (Signed)
Pt called in stating she had to go to urgent care and ended up having a staph infection in her toe. The doctor told her to let Dr. Delice Lesch know her white blood count was elevated to 11.6 after she had finished her 10 days on Amoxicillin and had been taking 2 days of Doxycycline.

## 2021-06-18 ENCOUNTER — Telehealth: Payer: Self-pay | Admitting: Neurology

## 2021-06-18 NOTE — Telephone Encounter (Signed)
Patient called about her last visit notes needing corrected re: medication.  She said she was prescribed lyrica and oxycodone by [facility], not Cymbalta (she is allergic to it, she said).  Per patient, please correct the visit notes (unless an amendment request is needed in writing).

## 2021-06-18 NOTE — Telephone Encounter (Signed)
Lyrica and oxycodone are on the patients medication list I do not see cymbalta to remove it.

## 2021-06-20 ENCOUNTER — Encounter: Payer: Self-pay | Admitting: Neurology

## 2021-06-20 ENCOUNTER — Telehealth (INDEPENDENT_AMBULATORY_CARE_PROVIDER_SITE_OTHER): Payer: Medicare Other | Admitting: Neurology

## 2021-06-20 VITALS — Ht 64.0 in | Wt 249.0 lb

## 2021-06-20 DIAGNOSIS — G35 Multiple sclerosis: Secondary | ICD-10-CM | POA: Diagnosis not present

## 2021-06-20 NOTE — Progress Notes (Addendum)
Virtual Visit via Video Note The purpose of this virtual visit is to provide medical care while limiting exposure to the novel coronavirus.    Consent was obtained for video visit:  Yes.   Answered questions that patient had about telehealth interaction:  Yes.   I discussed the limitations, risks, security and privacy concerns of performing an evaluation and management service by telemedicine. I also discussed with the patient that there may be a patient responsible charge related to this service. The patient expressed understanding and agreed to proceed.  Pt location: Home Physician Location: office Name of referring provider:  Wilburt Finlay, MD I connected with [place] at patients initiation/request on 06/20/2021 at  1:00 PM EDT by video enabled telemedicine application and verified that I am speaking with the correct person using two identifiers. Pt MRN:  [id number] Pt DOB:  1980-02-08 Video Participants:  Deziree L Coto   History of Present Illness:  The patient had a virtual video visit on 06/20/2021. She was last seen in the neurology clinic around 3 weeks ago and presents today for a visit prior to her MRI under sedation scheduled for June 27. She is seen in the office for psychogenic non-epileptic events, MS, migraines, neuropathy. She continues to overall do well. She has been feeling well since starting Ocrevus infusions for MS in 09/2020, last infusion was 03/2021. She mostly has pain going down her neck with neck flexion. She has not had any psychogenic non-epileptic events since August 2021. She has Botox scheduled this week for migraines. She continues on Topiramate '100mg'$  TID and Depakote '500mg'$  qhs for migraine prophylaxis. She is on Lyrica prescribed by Pain Management.   Diagnostic Data: last MRI brain, cervical, thoracic spine with and without contrast done 06/08/20 under sedation showed no enhancing lesions seen. Compared to 2020 imaging, there was a new lesion within  the right aspect of the spinal cord at T4 and small focus of signal abnormality within the central cord at T9. Similar patchy signal changes were seen at T6,T7, while T10 and 11 appears slightly progressed. Signal changes at C2-3, C4 were unchanged, brain MRI showed similar signal changes perpendicular to the lateral ventricles, callososeptal interface with no changes from 2020. Degenerative changes were seen at C5-6 and C6-7 with moderate disc degeneration, posterior osteophyte complex contributes to multifactorial moderate spinal stenosis with mild spinal cord flatterning and mild left neural foraminal narrowing at C5-6, moderate right and moderate/severe left neural foraminal narrowing at C6-7.    History on Initial Assessment 03/28/2015: This is a pleasant 41 yo RH woman with a history of relapsing-remitting multiple sclerosis, headaches, anxiety with panic attacks, conversion disorder with seizures. She presented at [redacted] weeks pregnant to determine if diazepam for anxiety/seizures would be a medication she can use, understanding this is pregnancy category D versus risks of injuring herself from falls when she has the psychogenic seizures. Records from her previous neurologists at [facility] were reviewed. She was diagnosed with MS in June 2008 while living in [place] state, she started having right thigh pain and right eye blurred vision. She was diagnosed with right optic neuritis and underwent MRI brain and lumbar puncture which confirmed a diagnosis of MS. 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 She has been on Gilenya since 2014 without any relapse. This was discontinued in December 2016 when she found out  she was pregnant. She has a history of chronic pain and migraines, and was taking Depakote and Topamax for headache prophylaxis, also stopped in December  2016. She reports having Botox for her migraines, but so far migraines are not as bad as they were.  She started having shaking episodes in April 2016. Initially Depakote dose was increased to '500mg'$  BID. She was back in May 2016 with multiple episodes of syncope, palpitations, a sensation of her heart jumping out of her chest. She also reported lightheadedness, facial numbness, and numbness in her hands and feet. On her last visit in June 2016 at [facility], shaking episodes were discussed. Events always occur after a stressful event. She had a spell in [place] where she had a normal EEG and was diagnosed with a conversion disorder. She had seen Dr. Darleene Cleaver and reportedly told her he could not help her because "her case was too complex." She started seeing a psychiatrist in [place], however since she switched neurologists, she reports that he cannot see her anymore. She has not been able to establish care with [facility] and continues to have anxiety and panic attacks "all the time." She is diaphoretic in the office today and reports she always feels anxious. When she starts having one of her seizures, she would have significant anxiety, "my body can't deal and goes into a seizure," she hears her heartbeat in her ears. If it slowly builds up, she can get herself to a safe place, however other times it "just hits me," and she falls to the ground and has injured herself. She reports falling out one time in the bathroom during a doctor visit. She is currently on Fluoxetine and Buspirone. Clonazepam in the past did not help. She would usually go to the ER for these spells, which would quiet down once she gets Benadryl and Diazepam.  She delivered early at [redacted] weeks gestation last July 12, 2015. A week after delivery, she started having numbness on the top of both feet, pain in the balls of her feet, with numbness traveling up to her mid-back. She also has numbness in both forearms, and reports her palms feel the  same as her soles, with constant stabbing burning pain. She had these symptoms pre-pregnancy but states the numbness only went up to her knees. During pregnancy, all her symptoms were less bothersome. She has been following with Pain management in [facility] and would occasionally take Oxycodone prior to her pregnancy.  Diagnostic Data:   I personally reviewed MRI brain with and without contrast done with [facility] last 10/02/13 which did not show any acute changes, there was multiple T2/FLAIR lesions in the subcortical and deep white matter regions, no abnormal enhancement. Hippocampi appear asymmetric, smaller on the right without abnormal signal or enhancement.   MRI C-spine with and without contrast 10/18/13 showed hazy T2 hyperintensity within the cord from C2-3 to C4-5 without abnormal enhancement.  per [facility] - MRI brain done 03/18/14: "No change in numerous multifocal T2 hyperintense white matter lesions relative to prior MRI from 04/17/2013 most consistent with demyelinating lesions given history of multiple sclerosis. No evidence of active demyelination."  MRI cervical spine done 03/18/14: "Relative to prior MRI from 10/20/2009, no change in patchy foci of increased T2 signal in the dorsal cord at C2- C4 in keeping with demyelinating lesions in this patient with a clinical history of multiple sclerosis.   No evidence of enhancement to suggest active demyelination. Moderately advanced degenerative disease at C5-C6 and C6-C7 with  interval development of left eccentric central, foraminal entry zone and foraminal disc extrusion at C6-C7 exerting mass effect on the exiting C7 and descending C8 roots.  MRI brain and C-spine 07/30/2015 with and without contrast : There was no abnormal enhancement. There were no changes from previous MRI brain in 09/2013 with multiple bilateral subcortical greater than 10 periventricular T2 hyperintensities. There were no cord lesions seen up to T3-4. There was chronic  degenerative disc disease and central canal stenosis at C5-6 and C6-7, no abnormal cord signal, unchanged from 06/2014.  MRI brain, cervical and thoracic spine with and without contrast in October 2020 did not show any new lesions, no abnormal enhancement. Stable findings with multiple scatted foci of FLAIR signal in the periventricular, deep, and juxtacortical white matter, subtle patchy cord signal abnormality at C2-4. She has degenerative disc osteophyte at C6-7 with moderate spinal stenosis, mild mass effect on the left greater than right spinal cord, advanced degenerative disc osteophyte at C5-6 with mild spinal stenosis and mild mass effect on left hemicord. Thoracic spine showed question subtle patchy cord signal abnormality at level of T6-7, T8-9, T9-10, no active demyelination. Mild degenerative spondylosis with no stenosis.  EMG/NCV done on both UE and right LE showed bilateral carpal tunnel syndrome, mild to moderate in degree, no evidence of polyneuropathy or radiculopathy on the right side.   Current Outpatient Medications on File Prior to Visit  Medication Sig Dispense Refill   albuterol (PROVENTIL HFA;VENTOLIN HFA) 108 (90 Base) MCG/ACT inhaler Inhale 2 puffs into the lungs every 6 (six) hours as needed for wheezing or shortness of breath. 1 Inhaler 0   amLODipine (NORVASC) 5 MG tablet Take 5 mg by mouth daily.   2   amphetamine-dextroamphetamine (ADDERALL XR) 10 MG 24 hr capsule Take 10 mg by mouth daily.     cariprazine (VRAYLAR) capsule Take 3 mg by mouth at bedtime.     divalproex (DEPAKOTE) 500 MG DR tablet Take 1 tablet (500 mg total) by mouth at bedtime. 90 tablet 3   dronabinol (MARINOL) 2.5 MG capsule TAKE 1 CAPSULE BY MOUTH 3 TIMES DAILY WITH MEALS. 90 capsule 3   EPINEPHrine 0.3 mg/0.3 mL IJ SOAJ injection Inject 0.3 mg into the muscle as needed for anaphylaxis.     famotidine (PEPCID) 40 MG tablet Take 1 tablet (40 mg total) by mouth at bedtime. 90 tablet 3   guanFACINE  (TENEX) 1 MG tablet Take 1 mg by mouth at bedtime.     loratadine (CLARITIN) 10 MG tablet Take 10 mg by mouth daily as needed for allergies.     medroxyPROGESTERone (DEPO-PROVERA) 150 MG/ML injection Inject 150 mg into the muscle every 3 (three) months.     Naphazoline-Glycerin (CLEAR EYES REDNESS RELIEF OP) Place 1 drop into both eyes 3 (three) times daily as needed (irrtiation).     omeprazole (PRILOSEC) 20 MG capsule Take 1 capsule (20 mg total) by mouth 2 (two) times daily before a meal. 180 capsule 3   oxycodone (OXY-IR) 5 MG capsule Take 5 mg by mouth in the morning and at bedtime.     pregabalin (LYRICA) 50 MG capsule Take 150 mg by mouth daily with lunch.     rosuvastatin (CRESTOR) 5 MG tablet Take 5 mg by mouth daily.     topiramate (TOPAMAX) 100 MG tablet Take 1 tablet three times a day 270 tablet 3   traZODone (DESYREL) 50 MG tablet Take 100 mg by mouth at bedtime.     pregabalin (  LYRICA) 225 MG capsule Take 1 capsule (225 mg total) by mouth 2 (two) times daily. 60 capsule 2   Current Facility-Administered Medications on File Prior to Visit  Medication Dose Route Frequency Provider Last Rate Last Admin   metoCLOPramide (REGLAN) injection 10 mg  10 mg Intravenous Once Milton Ferguson, MD         Observations/Objective:   Vitals:   06/20/21 0933  Weight: 249 lb (112.9 kg)  Height: '5\' 4"'$  (1.626 m)   GEN:  The patient appears stated age and is in NAD.  Neurological examination: Patient is awake, alert. No aphasia or dysarthria. Intact fluency and comprehension. Cranial nerves: Extraocular movements intact. No facial asymmetry. Motor: moves all extremities symmetrically, at least anti-gravity x 4.    Assessment and Plan:   This is a 41 yo RH woman presenting for an earlier visit for pre-MRI evaluation, she is scheduled for MRI under sedation on 07/03/2021 for interval follow-up for MS. Her last MRI in 2022 had shown new spinal cord lesions compared to 2020 imaging, no abnormal  enhancement. She was switched to [place] for MS, and has been feeling good. Proceed with MRI brain, cervical spine, and thoracic spine with and without contrast under sedation due to severe claustrophobia. Follow-up as scheduled in December 2023, call for any changes.   Follow Up Instructions:    -I discussed the assessment and treatment plan with the patient. The patient was provided an opportunity to ask questions and all were answered. The patient agreed with the plan and demonstrated an understanding of the instructions.   The patient was advised to call back or seek an in-person evaluation if the symptoms worsen or if the condition fails to improve as anticipated.    Cameron Sprang, MD

## 2021-06-22 ENCOUNTER — Ambulatory Visit (INDEPENDENT_AMBULATORY_CARE_PROVIDER_SITE_OTHER): Payer: Medicare Other | Admitting: Neurology

## 2021-06-22 DIAGNOSIS — G43709 Chronic migraine without aura, not intractable, without status migrainosus: Secondary | ICD-10-CM | POA: Diagnosis not present

## 2021-06-22 MED ORDER — ONABOTULINUMTOXINA 100 UNITS IJ SOLR
200.0000 [IU] | Freq: Once | INTRAMUSCULAR | Status: AC
  Administered 2021-06-22: 155 [IU] via INTRAMUSCULAR

## 2021-06-22 NOTE — Progress Notes (Signed)
[facility]  ° °Procedure Note Botox ° °Attending: Dr. Carlyle Achenbach ° °Preoperative Diagnosis(es): Chronic migraine ° °Consent obtained from: The patient °Benefits discussed included, but were not limited to decreased muscle tightness, increased joint range of motion, and decreased pain.  Risk discussed included, but were not limited pain and discomfort, bleeding, bruising, excessive weakness, venous thrombosis, muscle atrophy and dysphagia.  Anticipated outcomes of the procedure as well as he risks and benefits of the alternatives to the procedure, and the roles and tasks of the personnel to be involved, were discussed with the patient, and the patient consents to the procedure and agrees to proceed. A copy of the patient medication guide was given to the patient which explains the blackbox warning. ° °Patients identity and treatment sites confirmed Yes.  . ° °Details of Procedure: °Skin was cleaned with alcohol. Prior to injection, the needle plunger was aspirated to make sure the needle was not within a blood vessel.  There was no blood retrieved on aspiration.   ° °Following is a summary of the muscles injected  And the amount of Botulinum toxin used: ° °Dilution °200 units of Botox was reconstituted with 4 ml of preservative free normal saline. °Time of reconstitution: At the time of the office visit (<30 minutes prior to injection)  ° °Injections  °155 total units of Botox was injected with a 30 gauge needle. ° °Injection Sites: °L occipitalis: 15 units- 3 sites  °R occiptalis: 15 units- 3 sites ° °L upper trapezius: 15 units- 3 sites °R upper trapezius: 15 units- 3 sits          °L paraspinal: 10 units- 2 sites °R paraspinal: 10 units- 2 sites ° °Face °L frontalis(2 injection sites):10 units   °R frontalis(2 injection sites):10 units         °L corrugator: 5 units   °R corrugator: 5 units           °Procerus: 5 units   °L temporalis: 20 units °R temporalis: 20 units  ° °Agent:  °200 units of botulinum Type  A (Onobotulinum Toxin type A) was reconstituted with 4 ml of preservative free normal saline.  °Time of reconstitution: At the time of the office visit (<30 minutes prior to injection)  ° ° ° Total injected (Units):  155 ° Total wasted (Units):  45 ° °Patient tolerated procedure well without complications.   °Reinjection is anticipated in 3 months. ° ° °

## 2021-06-25 ENCOUNTER — Other Ambulatory Visit: Payer: Self-pay | Admitting: General Surgery

## 2021-06-25 DIAGNOSIS — R1030 Lower abdominal pain, unspecified: Secondary | ICD-10-CM

## 2021-07-02 ENCOUNTER — Encounter (HOSPITAL_COMMUNITY): Payer: Self-pay | Admitting: *Deleted

## 2021-07-02 NOTE — Anesthesia Preprocedure Evaluation (Addendum)
Anesthesia Evaluation  Patient identified by MRN, date of birth, ID band Patient awake    Reviewed: Allergy & Precautions, NPO status , Patient's Chart, lab work & pertinent test results  Airway Mallampati: II  TM Distance: >3 FB Neck ROM: Full    Dental no notable dental hx.    Pulmonary asthma , former smoker,    Pulmonary exam normal breath sounds clear to auscultation       Cardiovascular hypertension, Pt. on medications negative cardio ROS Normal cardiovascular exam Rhythm:Regular Rate:Normal     Neuro/Psych  Headaches, Seizures -,  Anxiety Depression  Neuromuscular disease negative psych ROS   GI/Hepatic Neg liver ROS, GERD  ,  Endo/Other  Morbid obesity  Renal/GU negative Renal ROS  negative genitourinary   Musculoskeletal  (+) Arthritis , Osteoarthritis,  Fibromyalgia -  Abdominal (+) + obese,   Peds negative pediatric ROS (+)  Hematology negative hematology ROS (+)   Anesthesia Other Findings   Reproductive/Obstetrics negative OB ROS                            Anesthesia Physical Anesthesia Plan  ASA: 3  Anesthesia Plan: General   Post-op Pain Management: Minimal or no pain anticipated   Induction: Intravenous  PONV Risk Score and Plan: 3 and Ondansetron, Dexamethasone, Midazolam and Treatment may vary due to age or medical condition  Airway Management Planned: LMA and Oral ETT  Additional Equipment:   Intra-op Plan:   Post-operative Plan: Extubation in OR  Informed Consent: I have reviewed the patients History and Physical, chart, labs and discussed the procedure including the risks, benefits and alternatives for the proposed anesthesia with the patient or authorized representative who has indicated his/her understanding and acceptance.     Dental advisory given  Plan Discussed with: CRNA  Anesthesia Plan Comments: (PAT note written 07/02/2021 by Shonna Chock, PA-C. )       Anesthesia Quick Evaluation

## 2021-07-03 ENCOUNTER — Encounter (HOSPITAL_COMMUNITY): Payer: Self-pay

## 2021-07-03 ENCOUNTER — Ambulatory Visit (HOSPITAL_COMMUNITY)
Admission: RE | Admit: 2021-07-03 | Discharge: 2021-07-03 | Disposition: A | Discharge status: Home / Self Care | Payer: Medicare Other | Source: Ambulatory Visit | Attending: Neurology | Admitting: Neurology

## 2021-07-03 ENCOUNTER — Encounter (HOSPITAL_COMMUNITY): Admission: RE | Disposition: A | Discharge status: Home / Self Care | Payer: Self-pay | Source: Home / Self Care

## 2021-07-03 ENCOUNTER — Ambulatory Visit (HOSPITAL_COMMUNITY)
Admission: RE | Admit: 2021-07-03 | Discharge: 2021-07-03 | Disposition: A | Discharge status: Home / Self Care | Payer: Medicare Other | Attending: Neurology | Admitting: Neurology

## 2021-07-03 ENCOUNTER — Ambulatory Visit (HOSPITAL_BASED_OUTPATIENT_CLINIC_OR_DEPARTMENT_OTHER): Payer: Medicare Other | Admitting: Certified Registered"

## 2021-07-03 ENCOUNTER — Ambulatory Visit (HOSPITAL_COMMUNITY): Payer: Medicare Other | Admitting: Certified Registered"

## 2021-07-03 ENCOUNTER — Other Ambulatory Visit: Payer: Self-pay

## 2021-07-03 DIAGNOSIS — M797 Fibromyalgia: Secondary | ICD-10-CM | POA: Diagnosis not present

## 2021-07-03 DIAGNOSIS — Z87891 Personal history of nicotine dependence: Secondary | ICD-10-CM | POA: Insufficient documentation

## 2021-07-03 DIAGNOSIS — F418 Other specified anxiety disorders: Secondary | ICD-10-CM

## 2021-07-03 DIAGNOSIS — K760 Fatty (change of) liver, not elsewhere classified: Secondary | ICD-10-CM | POA: Insufficient documentation

## 2021-07-03 DIAGNOSIS — E669 Obesity, unspecified: Secondary | ICD-10-CM | POA: Insufficient documentation

## 2021-07-03 DIAGNOSIS — F4310 Post-traumatic stress disorder, unspecified: Secondary | ICD-10-CM | POA: Diagnosis not present

## 2021-07-03 DIAGNOSIS — F40240 Claustrophobia: Secondary | ICD-10-CM | POA: Insufficient documentation

## 2021-07-03 DIAGNOSIS — G35 Multiple sclerosis: Secondary | ICD-10-CM | POA: Insufficient documentation

## 2021-07-03 DIAGNOSIS — F909 Attention-deficit hyperactivity disorder, unspecified type: Secondary | ICD-10-CM | POA: Diagnosis not present

## 2021-07-03 DIAGNOSIS — M4800 Spinal stenosis, site unspecified: Secondary | ICD-10-CM | POA: Insufficient documentation

## 2021-07-03 DIAGNOSIS — G43909 Migraine, unspecified, not intractable, without status migrainosus: Secondary | ICD-10-CM | POA: Insufficient documentation

## 2021-07-03 DIAGNOSIS — J45909 Unspecified asthma, uncomplicated: Secondary | ICD-10-CM | POA: Diagnosis not present

## 2021-07-03 DIAGNOSIS — F410 Panic disorder [episodic paroxysmal anxiety] without agoraphobia: Secondary | ICD-10-CM | POA: Diagnosis not present

## 2021-07-03 DIAGNOSIS — K219 Gastro-esophageal reflux disease without esophagitis: Secondary | ICD-10-CM | POA: Diagnosis not present

## 2021-07-03 DIAGNOSIS — I10 Essential (primary) hypertension: Secondary | ICD-10-CM | POA: Diagnosis not present

## 2021-07-03 DIAGNOSIS — F449 Dissociative and conversion disorder, unspecified: Secondary | ICD-10-CM | POA: Insufficient documentation

## 2021-07-03 DIAGNOSIS — M47812 Spondylosis without myelopathy or radiculopathy, cervical region: Secondary | ICD-10-CM | POA: Diagnosis not present

## 2021-07-03 DIAGNOSIS — Z6841 Body Mass Index (BMI) 40.0 and over, adult: Secondary | ICD-10-CM | POA: Insufficient documentation

## 2021-07-03 DIAGNOSIS — G8929 Other chronic pain: Secondary | ICD-10-CM | POA: Diagnosis not present

## 2021-07-03 HISTORY — PX: RADIOLOGY WITH ANESTHESIA: SHX6223

## 2021-07-03 LAB — CBC
HCT: 42.4 % (ref 36.0–46.0)
Hemoglobin: 14.4 g/dL (ref 12.0–15.0)
MCH: 30.1 pg (ref 26.0–34.0)
MCHC: 34 g/dL (ref 30.0–36.0)
MCV: 88.7 fL (ref 80.0–100.0)
Platelets: 329 10*3/uL (ref 150–400)
RBC: 4.78 MIL/uL (ref 3.87–5.11)
RDW: 12.6 % (ref 11.5–15.5)
WBC: 6 10*3/uL (ref 4.0–10.5)
nRBC: 0 % (ref 0.0–0.2)

## 2021-07-03 LAB — BASIC METABOLIC PANEL
Anion gap: 12 (ref 5–15)
BUN: 7 mg/dL (ref 6–20)
CO2: 23 mmol/L (ref 22–32)
Calcium: 8.7 mg/dL — ABNORMAL LOW (ref 8.9–10.3)
Chloride: 106 mmol/L (ref 98–111)
Creatinine, Ser: 0.75 mg/dL (ref 0.44–1.00)
GFR, Estimated: >60 mL/min (ref >60)
Glucose, Bld: 90 mg/dL (ref 70–99)
Potassium: 3 mmol/L — ABNORMAL LOW (ref 3.5–5.1)
Sodium: 141 mmol/L (ref 135–145)

## 2021-07-03 LAB — POCT PREGNANCY, URINE: Preg Test, Ur: NEGATIVE

## 2021-07-03 SURGERY — MRI WITH ANESTHESIA
Anesthesia: General

## 2021-07-03 MED ORDER — FENTANYL CITRATE (PF) 100 MCG/2ML IJ SOLN
INTRAMUSCULAR | Status: DC | PRN
  Administered 2021-07-03: 50 ug via INTRAVENOUS
  Administered 2021-07-03: 100 ug via INTRAVENOUS

## 2021-07-03 MED ORDER — CHLORHEXIDINE GLUCONATE 0.12 % MT SOLN
15.0000 mL | Freq: Once | OROMUCOSAL | Status: AC
  Administered 2021-07-03: 15 mL via OROMUCOSAL
  Filled 2021-07-03: qty 15

## 2021-07-03 MED ORDER — MIDAZOLAM HCL 5 MG/5ML IJ SOLN
INTRAMUSCULAR | Status: DC | PRN
  Administered 2021-07-03: 2 mg via INTRAVENOUS

## 2021-07-03 MED ORDER — SUGAMMADEX SODIUM 200 MG/2ML IV SOLN
INTRAVENOUS | Status: DC | PRN
  Administered 2021-07-03: 225 mg via INTRAVENOUS

## 2021-07-03 MED ORDER — PHENYLEPHRINE 80 MCG/ML (10ML) SYRINGE FOR IV PUSH (FOR BLOOD PRESSURE SUPPORT)
PREFILLED_SYRINGE | INTRAVENOUS | Status: DC | PRN
  Administered 2021-07-03: 80 ug via INTRAVENOUS

## 2021-07-03 MED ORDER — DEXAMETHASONE SODIUM PHOSPHATE 10 MG/ML IJ SOLN
INTRAMUSCULAR | Status: DC | PRN
  Administered 2021-07-03: 10 mg via INTRAVENOUS

## 2021-07-03 MED ORDER — LIDOCAINE 2% (20 MG/ML) 5 ML SYRINGE
INTRAMUSCULAR | Status: DC | PRN
  Administered 2021-07-03: 100 mg via INTRAVENOUS

## 2021-07-03 MED ORDER — GADOBUTROL 1 MMOL/ML IV SOLN
10.0000 mL | Freq: Once | INTRAVENOUS | Status: AC | PRN
  Administered 2021-07-03: 10 mL via INTRAVENOUS

## 2021-07-03 MED ORDER — ROCURONIUM BROMIDE 100 MG/10ML IV SOLN
INTRAVENOUS | Status: DC | PRN
  Administered 2021-07-03: 100 mg via INTRAVENOUS

## 2021-07-03 MED ORDER — PHENYLEPHRINE HCL-NACL 20-0.9 MG/250ML-% IV SOLN
INTRAVENOUS | Status: DC | PRN
  Administered 2021-07-03: 40 ug/min via INTRAVENOUS

## 2021-07-03 MED ORDER — PROPOFOL 10 MG/ML IV BOLUS
INTRAVENOUS | Status: DC | PRN
  Administered 2021-07-03: 200 mg via INTRAVENOUS

## 2021-07-03 MED ORDER — ORAL CARE MOUTH RINSE: 15.0000 mL | Freq: Once | OROMUCOSAL | Status: AC

## 2021-07-03 MED ORDER — LACTATED RINGERS IV SOLN: INTRAVENOUS | Status: DC

## 2021-07-03 MED ORDER — ONDANSETRON HCL 4 MG/2ML IJ SOLN
INTRAMUSCULAR | Status: DC | PRN
  Administered 2021-07-03: 4 mg via INTRAVENOUS

## 2021-07-03 NOTE — Anesthesia Procedure Notes (Signed)
Procedure Name: Intubation Date/Time: 07/03/2021 8:47 AM  Performed by: Marny Lowenstein, CRNAPre-anesthesia Checklist: Patient identified, Emergency Drugs available, Suction available and Patient being monitored Patient Re-evaluated:Patient Re-evaluated prior to induction Oxygen Delivery Method: Circle system utilized Preoxygenation: Pre-oxygenation with 100% oxygen Induction Type: IV induction Ventilation: Mask ventilation without difficulty Laryngoscope Size: McGraph and 3 Grade View: Grade I Tube type: Oral Tube size: 7.0 mm Number of attempts: 1 Airway Equipment and Method: Rigid stylet Placement Confirmation: ETT inserted through vocal cords under direct vision, positive ETCO2 and breath sounds checked- equal and bilateral Secured at: 20 cm Tube secured with: Tape Dental Injury: Teeth and Oropharynx as per pre-operative assessment  Comments: McGrath used d/t pt being on MRI table already positioned in head cradle. Easy mask. Pt also has issues with cervical spine lesions. Neutrality maintained.

## 2021-07-04 ENCOUNTER — Telehealth: Payer: Self-pay

## 2021-07-04 ENCOUNTER — Encounter (HOSPITAL_COMMUNITY): Payer: Self-pay | Admitting: Radiology

## 2021-07-04 NOTE — Telephone Encounter (Signed)
Pt called an informed that MRI brain and spinal cord did not show any new changes

## 2021-07-04 NOTE — Telephone Encounter (Signed)
-----   Message from Cameron Sprang, MD sent at 07/04/2021  8:19 AM EDT ----- Pls let her know the MRI brain and spinal cord did not show any new changes. Thanks

## 2021-07-20 NOTE — Patient Instructions (Signed)
Emily [name]  07/20/2021     '@[email or web address]'$ @   Your procedure is scheduled on  07/26/2021.   Report to [facility] at  0730  A.M.   Call this number if you have problems the morning of surgery:  [phone]   Remember:  Follow the diet instructions given to you by the office.    Your last dose of diethylpropion should have been 7/12 or before.      Take these medicines the morning of surgery with A SIP OF WATER       norvasc, pepcid, Oxy IR (if needed), lyrica, topamax.     Do not wear jewelry, make-up or nail polish.  Do not wear lotions, powders, or perfumes, or deodorant.  Do not shave 48 hours prior to surgery.  Men may shave face and neck.  Do not bring valuables to the hospital.  [facility] is not responsible for any belongings or valuables.  Contacts, dentures or bridgework may not be worn into surgery.  Leave your suitcase in the car.  After surgery it may be brought to your room.  For patients admitted to the hospital, discharge time will be determined by your treatment team.  Patients discharged the day of surgery will not be allowed to drive home and must have someone with them for 24 hours.    Special instructions:   DO NOT smoke tobacco or vape for 24 hours before your procedure.  Please read over the following fact sheets that you were given. Anesthesia Post-op Instructions and Care and Recovery After Surgery      Upper Endoscopy, Adult, Care After This sheet gives you information about how to care for yourself after your procedure. Your health care provider may also give you more specific instructions. If you have problems or questions, contact your health care provider. What can I expect after the procedure? After the procedure, it is common to have: A sore throat. Mild stomach pain or discomfort. Bloating. Nausea. Follow these instructions at home:  Follow instructions from your health care provider about what to eat or drink  after your procedure. Return to your normal activities as told by your health care provider. Ask your health care provider what activities are safe for you. Take over-the-counter and prescription medicines only as told by your health care provider. If you were given a sedative during the procedure, it can affect you for several hours. Do not drive or operate machinery until your health care provider says that it is safe. Keep all follow-up visits as told by your health care provider. This is important. Contact a health care provider if you have: A sore throat that lasts longer than one day. Trouble swallowing. Get help right away if: You vomit blood or your vomit looks like coffee grounds. You have: A fever. Bloody, black, or tarry stools. A severe sore throat or you cannot swallow. Difficulty breathing. Severe pain in your chest or abdomen. Summary After the procedure, it is common to have a sore throat, mild stomach discomfort, bloating, and nausea. If you were given a sedative during the procedure, it can affect you for several hours. Do not drive or operate machinery until your health care provider says that it is safe. Follow instructions from your health care provider about what to eat or drink after your procedure. Return to your normal activities as told by your health care provider. This information is not intended to replace advice given to you by  your health care provider. Make sure you discuss any questions you have with your health care provider. Document Revised: 10/30/2018 Document Reviewed: 05/26/2017 Elsevier Patient Education  [place] After This sheet gives you information about how to care for yourself after your procedure. Your health care provider may also give you more specific instructions. If you have problems or questions, contact your health care provider. What can I expect after the procedure? After the procedure, it is  common to have: Tiredness. Forgetfulness about what happened after the procedure. Impaired judgment for important decisions. Nausea or vomiting. Some difficulty with balance. Follow these instructions at home: For the time period you were told by your health care provider:     Rest as needed. Do not participate in activities where you could fall or become injured. Do not drive or use machinery. Do not drink alcohol. Do not take sleeping pills or medicines that cause drowsiness. Do not make important decisions or sign legal documents. Do not take care of children on your own. Eating and drinking Follow the diet that is recommended by your health care provider. Drink enough fluid to keep your urine pale yellow. If you vomit: Drink water, juice, or soup when you can drink without vomiting. Make sure you have little or no nausea before eating solid foods. General instructions Have a responsible adult stay with you for the time you are told. It is important to have someone help care for you until you are awake and alert. Take over-the-counter and prescription medicines only as told by your health care provider. If you have sleep apnea, surgery and certain medicines can increase your risk for breathing problems. Follow instructions from your health care provider about wearing your sleep device: Anytime you are sleeping, including during daytime naps. While taking prescription pain medicines, sleeping medicines, or medicines that make you drowsy. Avoid smoking. Keep all follow-up visits as told by your health care provider. This is important. Contact a health care provider if: You keep feeling nauseous or you keep vomiting. You feel light-headed. You are still sleepy or having trouble with balance after 24 hours. You develop a rash. You have a fever. You have redness or swelling around the IV site. Get help right away if: You have trouble breathing. You have new-onset confusion at  home. Summary For several hours after your procedure, you may feel tired. You may also be forgetful and have poor judgment. Have a responsible adult stay with you for the time you are told. It is important to have someone help care for you until you are awake and alert. Rest as told. Do not drive or operate machinery. Do not drink alcohol or take sleeping pills. Get help right away if you have trouble breathing, or if you suddenly become confused. This information is not intended to replace advice given to you by your health care provider. Make sure you discuss any questions you have with your health care provider. Document Revised: 11/28/2020 Document Reviewed: 11/26/2018 Elsevier Patient Education  [employer].

## 2021-07-23 ENCOUNTER — Encounter (HOSPITAL_COMMUNITY)
Admission: RE | Admit: 2021-07-23 | Discharge: 2021-07-23 | Disposition: A | Discharge status: Home / Self Care | Payer: Medicare Other | Source: Ambulatory Visit | Attending: Internal Medicine | Admitting: Internal Medicine

## 2021-07-23 ENCOUNTER — Encounter (HOSPITAL_COMMUNITY): Payer: Self-pay

## 2021-07-23 ENCOUNTER — Encounter: Payer: Self-pay | Admitting: *Deleted

## 2021-07-23 ENCOUNTER — Telehealth: Payer: Self-pay | Admitting: *Deleted

## 2021-07-23 ENCOUNTER — Telehealth: Payer: Self-pay

## 2021-07-23 VITALS — BP 141/95 | HR 89 | Temp 98.4°F | Resp 18 | Ht 64.0 in | Wt 250.0 lb

## 2021-07-23 DIAGNOSIS — Z01812 Encounter for preprocedural laboratory examination: Secondary | ICD-10-CM | POA: Insufficient documentation

## 2021-07-23 DIAGNOSIS — E876 Hypokalemia: Secondary | ICD-10-CM | POA: Diagnosis not present

## 2021-07-23 DIAGNOSIS — Z01818 Encounter for other preprocedural examination: Secondary | ICD-10-CM

## 2021-07-23 LAB — BASIC METABOLIC PANEL
Anion gap: 7 (ref 5–15)
BUN: 6 mg/dL (ref 6–20)
CO2: 24 mmol/L (ref 22–32)
Calcium: 8.8 mg/dL — ABNORMAL LOW (ref 8.9–10.3)
Chloride: 109 mmol/L (ref 98–111)
Creatinine, Ser: 0.85 mg/dL (ref 0.44–1.00)
GFR, Estimated: >60 mL/min (ref >60)
Glucose, Bld: 112 mg/dL — ABNORMAL HIGH (ref 70–99)
Potassium: 2.7 mmol/L — CL (ref 3.5–5.1)
Sodium: 140 mmol/L (ref 135–145)

## 2021-07-23 LAB — POCT PREGNANCY, URINE: Preg Test, Ur: NEGATIVE

## 2021-07-23 NOTE — Telephone Encounter (Signed)
PA done for Marinol 2.5 mg capsule. Dx used: R11.2 (nausea and vomiting), G35 (multiple sclerosis). Tried/failed: Zofran, Reglan, Compazine, and Phenergan. Waiting on a response from [employer].

## 2021-07-23 NOTE — Telephone Encounter (Signed)
Pt approved for her Dronabinol 2.5 mg capsules with the start date of 06/23/21 and end date of 07/23/2022. Phoned and advised the pt that her medication had been approved.

## 2021-07-23 NOTE — Telephone Encounter (Signed)
Pt called back. Rescheduled to 8/16 at 7:30am. Aware will mail new instructions/pre-op appt.

## 2021-07-23 NOTE — Telephone Encounter (Signed)
Called pt. She will call me back when she gets her planner to r/s. She is now on diethylpropion for weight loss she takes once a day. She just started this medication reason we did not have it on file.

## 2021-07-23 NOTE — Progress Notes (Signed)
Patient is taking the weight loss medication Diethylpropion.  She didn't stop the medication so she will need to be rescheduled.

## 2021-07-23 NOTE — Telephone Encounter (Signed)
-----   Message from Jacqulynn Cadet, RN sent at 07/23/2021 11:57 AM EDT ----- Regarding: Weight Loss Med Good Morning! Patient did not stop her weight loss medication and will need to be rescheduled per Dr Burtis Junes.

## 2021-07-25 ENCOUNTER — Telehealth: Payer: Self-pay

## 2021-07-25 ENCOUNTER — Other Ambulatory Visit: Payer: Self-pay | Admitting: Gastroenterology

## 2021-07-25 DIAGNOSIS — E876 Hypokalemia: Secondary | ICD-10-CM

## 2021-07-25 MED ORDER — POTASSIUM CHLORIDE CRYS ER 20 MEQ PO TBCR: 40.0000 meq | EXTENDED_RELEASE_TABLET | Freq: Every day | ORAL | 0 refills | Status: DC

## 2021-07-25 NOTE — Telephone Encounter (Signed)
Completed form and returned to [facility].

## 2021-07-25 NOTE — Telephone Encounter (Signed)
Form has been faxed back to [facility]

## 2021-07-25 NOTE — Telephone Encounter (Signed)
Refill request received for Dronabinol 2.5 mg capsules. Pt's last ov was with Vicente Males on 04/17/2021. Form to submit refill is on your desk.

## 2021-07-30 ENCOUNTER — Encounter (HOSPITAL_COMMUNITY): Payer: Self-pay

## 2021-07-30 ENCOUNTER — Other Ambulatory Visit: Payer: Self-pay

## 2021-07-30 ENCOUNTER — Other Ambulatory Visit: Payer: Self-pay | Admitting: Gastroenterology

## 2021-07-30 ENCOUNTER — Emergency Department (HOSPITAL_COMMUNITY)
Admission: EM | Admit: 2021-07-30 | Discharge: 2021-07-30 | Disposition: A | Discharge status: Home / Self Care | Payer: Medicare Other | Attending: Emergency Medicine | Admitting: Emergency Medicine

## 2021-07-30 DIAGNOSIS — I10 Essential (primary) hypertension: Secondary | ICD-10-CM | POA: Insufficient documentation

## 2021-07-30 DIAGNOSIS — E876 Hypokalemia: Secondary | ICD-10-CM | POA: Diagnosis present

## 2021-07-30 DIAGNOSIS — J45909 Unspecified asthma, uncomplicated: Secondary | ICD-10-CM | POA: Diagnosis not present

## 2021-07-30 DIAGNOSIS — Z87891 Personal history of nicotine dependence: Secondary | ICD-10-CM | POA: Diagnosis not present

## 2021-07-30 LAB — I-STAT CHEM 8, ED
BUN: 5 mg/dL — ABNORMAL LOW (ref 6–20)
Calcium, Ion: 1.2 mmol/L (ref 1.15–1.40)
Chloride: 106 mmol/L (ref 98–111)
Creatinine, Ser: 0.6 mg/dL (ref 0.44–1.00)
Glucose, Bld: 82 mg/dL (ref 70–99)
HCT: 42 % (ref 36.0–46.0)
Hemoglobin: 14.3 g/dL (ref 12.0–15.0)
Potassium: 3.1 mmol/L — ABNORMAL LOW (ref 3.5–5.1)
Sodium: 142 mmol/L (ref 135–145)
TCO2: 22 mmol/L (ref 22–32)

## 2021-07-30 MED ORDER — POTASSIUM CHLORIDE CRYS ER 20 MEQ PO TBCR: 40.0000 meq | EXTENDED_RELEASE_TABLET | Freq: Every day | ORAL | 0 refills | Status: DC

## 2021-07-30 NOTE — Telephone Encounter (Signed)
Please let her know I will send in another Rx for potassium 40 meq x 4 days. She can have BMP repeated on Friday. She does not need to go through the ED to have this completed. She can go to [facility] or [facility]. Please arrange BMP.

## 2021-07-30 NOTE — ED Provider Notes (Signed)
[facility] Emergency Department Provider Note MRN:  [id number]  Arrival date & time: 07/30/21     Chief Complaint   Labs Only   History of Present Illness   Emily [name] is a 41 y.o. year-old female with a history of MS presenting to the ED with chief complaint of labs.  Patient says that she was instructed to have her labs drawn again.  Had low potassium recently and has been on a repletion regimen.  She has no complaints at this time, just wants her potassium checked.  Review of Systems  A thorough review of systems was obtained and all systems are negative except as noted in the HPI and PMH.   Patient's Health History    Past Medical History:  Diagnosis Date   ADHD    Anxiety    Asthma as a child   Chronic back pain    Chronic pain    Conversion disorder    DDD (degenerative disc disease), cervical    Depression    External hemorrhoid    Fatty liver    Fibromyalgia    GERD (gastroesophageal reflux disease)    History of kidney stones    passed stones   HTN (hypertension) 02/27/2015   JC virus antibody positive    Migraines    MS (multiple sclerosis) ([place]) 06/2006   Neuromuscular disorder ([place])    MS   Neuropathy    OA (osteoarthritis)    Ovarian cyst    Panic attack    Perforated bowel ([place]) 2009   colostomy bag for 3 motnhs   Pneumonia    Pseudoseizures    last seizure was in 08/2018    PTSD (post-traumatic stress disorder)    S/P emergency C-section    Seasonal allergies    Urinary urgency    Vertigo    Wears contact lenses    Wears glasses     Past Surgical History:  Procedure Laterality Date   ABDOMINAL SURGERY     ABDOMINAL WOUND EXPLORATION   03/19/2019   Abdominal Wound Exploration with Explantation of Mesh   APPENDECTOMY     BOWEL RESECTION  01/2007   with colostomy   BOWEL RESECTION N/A 06/14/2019   Procedure: SMALL BOWEL RESECTION;  Surgeon: Ralene Ok, MD;  Location: [place];  Service: General;   Laterality: N/A;   CESAREAN SECTION N/A 07/12/2015   Procedure: CESAREAN SECTION;  Surgeon: Guss Bunde, MD;  Location: [place];  Service: Obstetrics;  Laterality: N/A;   COLON SURGERY     part of colon and intestines removed; colostomy 2008   COLONOSCOPY WITH PROPOFOL N/A 01/29/2017   Procedure: COLONOSCOPY WITH PROPOFOL;  Surgeon: Ileana Roup, MD;  Location: [facility];  Service: General;  Laterality: N/A;   COLOSTOMY CLOSURE  04/2007   EVALUATION UNDER ANESTHESIA WITH HEMORRHOIDECTOMY N/A 06/11/2018   Procedure: ANORECTAL EXAM UNDER ANESTHESIA WITH HEMORRHOIDECTOMY;  Surgeon: Ileana Roup, MD;  Location: [place];  Service: General;  Laterality: N/A;   EXCISIONAL HEMORRHOIDECTOMY     EXTREMITY CYST EXCISION  1994   right leg   HERNIA REPAIR     INCISIONAL HERNIA REPAIR N/A 02/16/2016   Procedure: HERNIA REPAIR INCISIONAL;  Surgeon: Fanny Skates, MD;  Location: [facility];  Service: General;  Laterality: N/A;   INSERTION OF MESH  02/16/2016   Procedure: INSERTION OF MESH;  Surgeon: Fanny Skates, MD;  Location: [facility];  Service: General;;   LAPAROSCOPY N/A 06/14/2019  Procedure: ATTEMPTED DIAGNOSTIC LAPAROSCOPY;  Surgeon: Ralene Ok, MD;  Location: [place];  Service: General;  Laterality: N/A;   LAPAROTOMY  03/19/2019   Procedure: Abdominal Wound Exploration with Explantation of Mesh;  Surgeon: Ralene Ok, MD;  Location: [place];  Service: General;;   LAPAROTOMY N/A 06/14/2019   Procedure: EXPLORATORY LAPAROTOMY;  Surgeon: Ralene Ok, MD;  Location: [place];  Service: General;  Laterality: N/A;   LYSIS OF ADHESION N/A 06/14/2019   Procedure: LYSIS OF ADHESIONS;  Surgeon: Ralene Ok, MD;  Location: [place];  Service: General;  Laterality: N/A;   RADIOLOGY WITH ANESTHESIA N/A 03/06/2017   Procedure: MRI WITH ANESTHESIA OF CERVICAL SPINE WITHOUT CONTRAST, MRI OF LUMBAR SPINE WITHOUT CONTRAST;  Surgeon: Radiologist, Medication, MD;  Location:  [place];  Service: Radiology;  Laterality: N/A;   RADIOLOGY WITH ANESTHESIA N/A 09/15/2018   Procedure: MRI OF BRAIN WITH AND WITHOUT CONTRAST;  Surgeon: Radiologist, Medication, MD;  Location: [facility] [place];  Service: Radiology;  Laterality: N/A;   RADIOLOGY WITH ANESTHESIA N/A 10/13/2018   Procedure: MRI OF BRAIN WITH AND WITHOUT CONTRAST;  Surgeon: Radiologist, Medication, MD;  Location: [place];  Service: Radiology;  Laterality: N/A;   RADIOLOGY WITH ANESTHESIA N/A 10/26/2018   Procedure: MRI UNDER ANESTHESIA; HEAD, CERVICAL AND THORASCIC SPINE;  Surgeon: Radiologist, Medication, MD;  Location: [place];  Service: Radiology;  Laterality: N/A;   RADIOLOGY WITH ANESTHESIA N/A 06/08/2020   Procedure: MRI WITH ANESTHESIA BRAIN WITH AND WITHOUT CONTRAST.,  CERVICAL BRAIN WITH AND WITHOUT CONTRAST, THORACIC SPINE WITH AND WITHOUT;  Surgeon: Radiologist, Medication, MD;  Location: [place];  Service: Radiology;  Laterality: N/A;   RADIOLOGY WITH ANESTHESIA N/A 07/03/2021   Procedure: MRI BRAIN WITH OR WITHOUT CONTRAST WITH ANESTHESIA;  Surgeon: Radiologist, Medication, MD;  Location: [place];  Service: Radiology;  Laterality: N/A;   SCAR REVISION  01/21/2011   Procedure: SCAR REVISION;  Surgeon: Hermelinda Dellen;  Location: [facility];  Service: Plastics;  Laterality: N/A;  exploration of scar of abdomen and repair of defect   TOOTH EXTRACTION Left 10/2016   TRANSRECTAL DRAINAGE OF PELVIC ABSCESS      Family History  Problem Relation Age of Onset   Diabetes Mother    Hypertension Mother    Diabetes Father    Hypertension Father    Arthritis Father    Cancer Maternal Grandmother    Cancer Maternal Grandfather    Alcohol abuse Neg Hx    Anxiety disorder Neg Hx    Bipolar disorder Neg Hx    Drug abuse Neg Hx    Depression Neg Hx    Colon cancer Neg Hx     Social History   Socioeconomic History   Marital status: Single    Spouse name: Not on file   Number of children: 1   Years of education:  College   Highest education level: Not on file  Occupational History    Employer: OTHER    Comment: disability  Tobacco Use   Smoking status: Former    Packs/day: 0.25    Years: 10.00    Total pack years: 2.50    Types: Cigarettes    Quit date: 12/2014    Years since quitting: 6.6   Smokeless tobacco: Never  Vaping Use   Vaping Use: Never used  Substance and Sexual Activity   Alcohol use: Not Currently    Comment: rare   Drug use: Not Currently    Comment: marinol that shows up at  thc   Sexual activity: Not Currently    Partners: Male    Birth control/protection: Injection    Comment: Depo Injections - Next inj due in Sept 2023  Other Topics Concern   Not on file  Social History Narrative    Born and raised in [place], [place] by parents. Pt has one younger sister. Pt has a Best boy. Pt worked from 1999-2006. She stopped due to MS and is currently on disability.        Married for less than 1 yr in 1999 that ended in divorce.      Caffeine Use: 1 20oz soda daily and 1 cup of coffee      Lives with daughter apartment first floor   Right handed    Social Determinants of Health   Financial Resource Strain: Not on file  Food Insecurity: Not on file  Transportation Needs: Not on file  Physical Activity: Not on file  Stress: Not on file  Social Connections: Not on file  Intimate Partner Violence: Not on file     Physical Exam   Vitals:   07/30/21 0535 07/30/21 0539  BP: (!) 182/116   Pulse: (!) 101   Resp: 19   Temp: 98 F (36.7 C)   SpO2: 97% 97%    CONSTITUTIONAL: Well-appearing, NAD NEURO/PSYCH:  Alert and oriented x 3, no focal deficits EYES:  eyes equal and reactive ENT/NECK:  no LAD, no JVD CARDIO: Regular rate, well-perfused, normal S1 and S2 PULM:  CTAB no wheezing or rhonchi GI/GU:  non-distended, non-tender MSK/SPINE:  No gross deformities, no edema SKIN:  no rash, atraumatic   *Additional and/or pertinent findings included  in MDM below  Diagnostic and Interventional Summary    EKG Interpretation  Date/Time:    Ventricular Rate:    PR Interval:    QRS Duration:   QT Interval:    QTC Calculation:   R Axis:     Text Interpretation:         Labs Reviewed  I-STAT CHEM 8, ED - Abnormal; Notable for the following components:      Result Value   Potassium 3.1 (*)    BUN 5 (*)    All other components within normal limits    No orders to display    Medications - No data to display   Procedures  /  Critical Care Procedures  ED Course and Medical Decision Making  Initial Impression and Ddx Visit for lab check, no symptoms, awaiting i-STAT Chem-8.  Nothing to suggest emergent process, anticipating discharge.  Past medical/surgical history that increases complexity of ED encounter: MS  Interpretation of Diagnostics I personally reviewed the laboratory assessment and my interpretation is as follows: Potassium improved, up to 3.1.  Otherwise no significant electrolyte disturbance.  Normal kidney function    Patient Reassessment and Ultimate Disposition/Management     Patient is reassured by the improved potassium, will follow-up with primary care doctor.  Patient management required discussion with the following services or consulting groups:  None  Complexity of Problems Addressed Acute complicated illness or Injury  Additional Data Reviewed and Analyzed Further history obtained from: None  Additional Factors Impacting ED Encounter Risk None  Barth Kirks. Sedonia Small, [place] [email or web address]  Final Clinical Impressions(s) / ED Diagnoses     ICD-10-CM   1. Hypokalemia  E87.6       ED Discharge Orders     None  Discharge Instructions Discussed with and Provided to Patient:     Discharge Instructions      You were evaluated in the Emergency Department and after careful evaluation, we did not find any emergent  condition requiring admission or further testing in the hospital.  Your exam/testing today is overall reassuring.  Potassium level up to 3.1.  Recommend following up with your regular doctor to discuss future management.  Until then recommend increasing the amount of potassium in your diet.  Please return to the Emergency Department if you experience any worsening of your condition.   Thank you for allowing [place] to be a part of your care.       Maudie Flakes, MD 07/30/21 [phone]

## 2021-07-30 NOTE — Telephone Encounter (Signed)
Pt made aware of Rx being sent to the pharmacy and repeat blood work on Friday at [facility] in system already and released

## 2021-07-30 NOTE — Telephone Encounter (Signed)
FYI:  Phoned and advised the pt of the result note. She does have a appt Aug 4th with her Dr. Abbott Pao has completed her Rx . States it was only a few pills.

## 2021-07-30 NOTE — Telephone Encounter (Signed)
Potassium was still low at 3.1. OTC potassium is not going to be appropriate in this case. I am not sure why her potassium is chronically low, but she will need to discuss this a primary care doctor who can work this up and continue to monitor routinely. She told me Friday that she was going to talk with her pain doctor about being her PCP. Did she have this conversation or does she have a PCP at this time? If not, she will need to establish with someone ASAP.  I can provide additional supplementation and we can plan to recheck labs, but low potassium is not something we manage. Has she completed her Rx I sent for 40 meq daily?

## 2021-07-30 NOTE — ED Notes (Signed)
Discharge instructions reviewed with patient. Patient has an understanding with no questions. Patient discharged home.

## 2021-07-30 NOTE — ED Triage Notes (Signed)
Is supposed to have an endoscopy done in 2 weeks. Had pre labs drawn and found that her potassium was low. Put on a short regimen of potassium and told to come to have her level checked again.

## 2021-07-30 NOTE — Telephone Encounter (Signed)
Pt phoned this am stating she went to the ED this morning and that she wants to know what to do about her potassium being low. She also wanted to know would OTC potassium help her. I advised this pt I could not give her that advise. I advised her that it has to come from the dr. According to the pt's conversation with Loma Sousa Friday she was taking potassium.

## 2021-07-30 NOTE — Discharge Instructions (Signed)
You were evaluated in the Emergency Department and after careful evaluation, we did not find any emergent condition requiring admission or further testing in the hospital.  Your exam/testing today is overall reassuring.  Potassium level up to 3.1.  Recommend following up with your regular doctor to discuss future management.  Until then recommend increasing the amount of potassium in your diet.  Please return to the Emergency Department if you experience any worsening of your condition.   Thank you for allowing [place] to be a part of your care.

## 2021-07-30 NOTE — Telephone Encounter (Signed)
Noted  

## 2021-08-03 ENCOUNTER — Encounter (HOSPITAL_BASED_OUTPATIENT_CLINIC_OR_DEPARTMENT_OTHER): Payer: Self-pay | Admitting: Surgery

## 2021-08-07 DIAGNOSIS — B009 Herpesviral infection, unspecified: Secondary | ICD-10-CM

## 2021-08-07 HISTORY — DX: Herpesviral infection, unspecified: B00.9

## 2021-08-10 ENCOUNTER — Ambulatory Visit (HOSPITAL_BASED_OUTPATIENT_CLINIC_OR_DEPARTMENT_OTHER): Admission: RE | Admit: 2021-08-10 | Payer: Medicare Other | Source: Home / Self Care | Admitting: Surgery

## 2021-08-10 HISTORY — DX: Personal history of other diseases of the respiratory system: Z87.09

## 2021-08-10 SURGERY — EXCISION, MASS, RECTUM, ANAL APPROACH
Anesthesia: General

## 2021-08-20 ENCOUNTER — Encounter (HOSPITAL_COMMUNITY): Payer: Self-pay

## 2021-08-20 ENCOUNTER — Encounter (HOSPITAL_COMMUNITY)
Admission: RE | Admit: 2021-08-20 | Discharge: 2021-08-20 | Disposition: A | Discharge status: Home / Self Care | Payer: Medicare Other | Source: Ambulatory Visit | Attending: Internal Medicine | Admitting: Internal Medicine

## 2021-08-20 ENCOUNTER — Other Ambulatory Visit: Payer: Self-pay

## 2021-08-20 VITALS — Ht 64.0 in | Wt 247.0 lb

## 2021-08-20 DIAGNOSIS — Z01818 Encounter for other preprocedural examination: Secondary | ICD-10-CM

## 2021-08-22 ENCOUNTER — Encounter (HOSPITAL_COMMUNITY)
Admission: RE | Disposition: A | Discharge status: Home / Self Care | Payer: Self-pay | Source: Home / Self Care | Attending: Internal Medicine

## 2021-08-22 ENCOUNTER — Encounter (HOSPITAL_COMMUNITY): Payer: Self-pay | Admitting: Internal Medicine

## 2021-08-22 ENCOUNTER — Ambulatory Visit (HOSPITAL_COMMUNITY): Payer: Medicare Other | Admitting: Vascular Surgery

## 2021-08-22 ENCOUNTER — Ambulatory Visit (HOSPITAL_BASED_OUTPATIENT_CLINIC_OR_DEPARTMENT_OTHER): Payer: Medicare Other | Admitting: Vascular Surgery

## 2021-08-22 ENCOUNTER — Ambulatory Visit (HOSPITAL_COMMUNITY)
Admission: RE | Admit: 2021-08-22 | Discharge: 2021-08-22 | Disposition: A | Discharge status: Home / Self Care | Payer: Medicare Other | Attending: Internal Medicine | Admitting: Internal Medicine

## 2021-08-22 ENCOUNTER — Telehealth: Payer: Self-pay

## 2021-08-22 DIAGNOSIS — M797 Fibromyalgia: Secondary | ICD-10-CM | POA: Insufficient documentation

## 2021-08-22 DIAGNOSIS — R110 Nausea: Secondary | ICD-10-CM

## 2021-08-22 DIAGNOSIS — Z87891 Personal history of nicotine dependence: Secondary | ICD-10-CM | POA: Insufficient documentation

## 2021-08-22 DIAGNOSIS — Z79899 Other long term (current) drug therapy: Secondary | ICD-10-CM | POA: Diagnosis not present

## 2021-08-22 DIAGNOSIS — Z6841 Body Mass Index (BMI) 40.0 and over, adult: Secondary | ICD-10-CM | POA: Insufficient documentation

## 2021-08-22 DIAGNOSIS — K449 Diaphragmatic hernia without obstruction or gangrene: Secondary | ICD-10-CM | POA: Diagnosis not present

## 2021-08-22 DIAGNOSIS — K2100 Gastro-esophageal reflux disease with esophagitis, without bleeding: Secondary | ICD-10-CM

## 2021-08-22 DIAGNOSIS — I10 Essential (primary) hypertension: Secondary | ICD-10-CM | POA: Insufficient documentation

## 2021-08-22 DIAGNOSIS — R1310 Dysphagia, unspecified: Secondary | ICD-10-CM | POA: Diagnosis not present

## 2021-08-22 DIAGNOSIS — Z01818 Encounter for other preprocedural examination: Secondary | ICD-10-CM

## 2021-08-22 DIAGNOSIS — R1314 Dysphagia, pharyngoesophageal phase: Secondary | ICD-10-CM | POA: Insufficient documentation

## 2021-08-22 DIAGNOSIS — R112 Nausea with vomiting, unspecified: Secondary | ICD-10-CM | POA: Diagnosis present

## 2021-08-22 DIAGNOSIS — G35 Multiple sclerosis: Secondary | ICD-10-CM | POA: Insufficient documentation

## 2021-08-22 HISTORY — PX: ESOPHAGOGASTRODUODENOSCOPY (EGD) WITH PROPOFOL: SHX5813

## 2021-08-22 HISTORY — PX: ESOPHAGEAL DILATION: SHX303

## 2021-08-22 LAB — POCT PREGNANCY, URINE: Preg Test, Ur: NEGATIVE

## 2021-08-22 SURGERY — ESOPHAGOGASTRODUODENOSCOPY (EGD) WITH PROPOFOL
Anesthesia: General

## 2021-08-22 MED ORDER — PANTOPRAZOLE SODIUM 40 MG PO TBEC: 40.0000 mg | DELAYED_RELEASE_TABLET | Freq: Every day | ORAL | 11 refills | Status: DC

## 2021-08-22 MED ORDER — LIDOCAINE HCL 1 % IJ SOLN
INTRAMUSCULAR | Status: DC | PRN
  Administered 2021-08-22: 50 mg via INTRADERMAL

## 2021-08-22 MED ORDER — MIDAZOLAM HCL 2 MG/2ML IJ SOLN
INTRAMUSCULAR | Status: AC
  Filled 2021-08-22: qty 2

## 2021-08-22 MED ORDER — CHLORHEXIDINE GLUCONATE CLOTH 2 % EX PADS: 6.0000 | MEDICATED_PAD | Freq: Once | CUTANEOUS | Status: DC

## 2021-08-22 MED ORDER — LIDOCAINE HCL (PF) 2 % IJ SOLN
INTRAMUSCULAR | Status: AC
  Filled 2021-08-22: qty 45

## 2021-08-22 MED ORDER — EPHEDRINE 5 MG/ML INJ
INTRAVENOUS | Status: AC
  Filled 2021-08-22: qty 5

## 2021-08-22 MED ORDER — PROPOFOL 10 MG/ML IV BOLUS
INTRAVENOUS | Status: DC | PRN
  Administered 2021-08-22 (×2): 50 mg via INTRAVENOUS
  Administered 2021-08-22: 30 mg via INTRAVENOUS
  Administered 2021-08-22 (×2): 50 mg via INTRAVENOUS

## 2021-08-22 MED ORDER — MIDAZOLAM HCL 5 MG/5ML IJ SOLN
INTRAMUSCULAR | Status: DC | PRN
  Administered 2021-08-22: 2 mg via INTRAVENOUS

## 2021-08-22 MED ORDER — LACTATED RINGERS IV SOLN: INTRAVENOUS | Status: DC

## 2021-08-22 MED ORDER — PHENYLEPHRINE 80 MCG/ML (10ML) SYRINGE FOR IV PUSH (FOR BLOOD PRESSURE SUPPORT)
PREFILLED_SYRINGE | INTRAVENOUS | Status: AC
  Filled 2021-08-22: qty 20

## 2021-08-22 MED ORDER — PROPOFOL 1000 MG/100ML IV EMUL
INTRAVENOUS | Status: AC
  Filled 2021-08-22: qty 100

## 2021-08-22 NOTE — Op Note (Addendum)
[facility] Patient Name: Emily [name] Procedure Date: 08/22/2021 7:06 AM MRN: [id number] Date of Birth: 1980-10-05 Attending MD: Norvel Richards , MD CSN: [id number] Age: 41 Admit Type: Outpatient Procedure:                Upper GI endoscopy Indications:              Dysphagia, Nausea Providers:                Norvel Richards, MD, Janeece Riggers, RN, Dereck Leep, Technician, Ladoris Gene Technician,                            Technician Referring MD:              Medicines:                Propofol per Anesthesia Complications:            No immediate complications. Estimated Blood Loss:     Estimated blood loss was minimal. Estimated blood                            loss: none. Procedure:                Pre-Anesthesia Assessment:                           - Prior to the procedure, a History and Physical                            was performed, and patient medications and                            allergies were reviewed. The patient's tolerance of                            previous anesthesia was also reviewed. The risks                            and benefits of the procedure and the sedation                            options and risks were discussed with the patient.                            All questions were answered, and informed consent                            was obtained. Prior Anticoagulants: The patient has                            taken no previous anticoagulant or antiplatelet                            agents. ASA Grade  Assessment: III - A patient with                            severe systemic disease. After reviewing the risks                            and benefits, the patient was deemed in                            satisfactory condition to undergo the procedure.                           After obtaining informed consent, the endoscope was                            passed under direct vision. Throughout  the                            procedure, the patient's blood pressure, pulse, and                            oxygen saturations were monitored continuously. The                            GIF-H190 ([id number]) scope was introduced through the                            mouth, and advanced to the second part of duodenum.                            The upper GI endoscopy was accomplished without                            difficulty. The patient tolerated the procedure                            well. Scope In: 0:56:97 AM Scope Out: 7:52:08 AM Total Procedure Duration: 0 hours 5 minutes 19 seconds  Findings:      Patent tubular esophagus. Patulous EG junction. (2) inverted 5 mm "V"       erosions straddling the GE junction. No Barrett's epithelium. Gastric       cavity empty. Small hiatal hernia. Normal gastric mucosa. Patent pylorus.      The duodenal bulb and second portion of the duodenum were normal. Scope       was with removed. A 56 French Maloney dilator was passed to full       insertion without resistance. A look back revealed no complication       related to this maneuver. Impression:               - Mild erosive reflux esophagitis/patulous EG                            junction. Status post [facility] dilation. Small  hiatal hernia. Normal duodenal bulb and second                            portion of the duodenum.                           - No specimens collected.                           -Patient's "nausea" may be a GERD equivalent                            symptom. Omeprazole 20 mg daily likely not                            sufficient to control her symptoms. Moderate Sedation:      Moderate (conscious) sedation was personally administered by an       anesthesia professional. The following parameters were monitored: oxygen       saturation, heart rate, blood pressure, respiratory rate, EKG, adequacy       of pulmonary ventilation, and response to  care. Recommendation:           - Patient has a contact number available for                            emergencies. The signs and symptoms of potential                            delayed complications were discussed with the                            patient. Return to normal activities tomorrow.                            Written discharge instructions were provided to the                            patient.                           - Advance diet as tolerated.                           - Continue present medications. Stop omeprazole.                            Begin Protonix 40 mg daily 30 minutes before                            breakfast.                           - Return to my office in 3 months. Procedure Code(s):        --- Professional ---  49201, Esophagogastroduodenoscopy, flexible,                            transoral; diagnostic, including collection of                            specimen(s) by brushing or washing, when performed                            (separate procedure) Diagnosis Code(s):        --- Professional ---                           R13.10, Dysphagia, unspecified                           R11.0, Nausea CPT copyright 2019 American Medical Association. All rights reserved. The codes documented in this report are preliminary and upon coder review may  be revised to meet current compliance requirements. Cristopher Estimable. Catheleen Langhorne, MD Norvel Richards, MD 08/22/2021 8:59:41 AM This report has been signed electronically. Number of Addenda: 0

## 2021-08-22 NOTE — H&P (Signed)
$'@LOGO'm$ @   Primary Care Physician:  Emelia Loron, NP Primary Gastroenterologist:  Dr. Gala Romney  Pre-Procedure History & Physical: HPI:  Emily [name] is a 41 y.o. female here for further evaluation of chronic nausea.  Describes intermittent esophageal dysphagia and transient food impactions as well.  Denies typical reflux symptoms along Prilosec 20 mg once daily.  Past Medical History:  Diagnosis Date   ADHD    Anxiety    Chronic back pain    Conversion disorder    DDD (degenerative disc disease), cervical    Depression    External hemorrhoid    Fatty liver    Fibromyalgia    GERD (gastroesophageal reflux disease)    History of asthma    childhood   History of bowel resection 2009   for perforated bowel--- colostomy bag for 3 motnhs   History of kidney stones    passed stones   HTN (hypertension) 02/27/2015   JC virus antibody positive    Migraines    MS (multiple sclerosis) ([place]) 06/2006   Neuropathy    OA (osteoarthritis)    Ovarian cyst    Panic attack    Pseudoseizures    last seizure was in 08/2018    PTSD (post-traumatic stress disorder)    Seasonal allergies    Urinary urgency    Vertigo    Wears contact lenses    Wears glasses     Past Surgical History:  Procedure Laterality Date   ABDOMINAL SURGERY     ABDOMINAL WOUND EXPLORATION   03/19/2019   Abdominal Wound Exploration with Explantation of Mesh   APPENDECTOMY     BOWEL RESECTION  01/2007   with colostomy   BOWEL RESECTION N/A 06/14/2019   Procedure: SMALL BOWEL RESECTION;  Surgeon: Ralene Ok, MD;  Location: [place];  Service: General;  Laterality: N/A;   CESAREAN SECTION N/A 07/12/2015   Procedure: CESAREAN SECTION;  Surgeon: Guss Bunde, MD;  Location: [place];  Service: Obstetrics;  Laterality: N/A;   COLON SURGERY     part of colon and intestines removed; colostomy 2008   COLONOSCOPY WITH PROPOFOL N/A 01/29/2017   Procedure: COLONOSCOPY WITH PROPOFOL;  Surgeon: Ileana Roup, MD;  Location: [facility];  Service: General;  Laterality: N/A;   COLOSTOMY CLOSURE  04/2007   EVALUATION UNDER ANESTHESIA WITH HEMORRHOIDECTOMY N/A 06/11/2018   Procedure: ANORECTAL EXAM UNDER ANESTHESIA WITH HEMORRHOIDECTOMY;  Surgeon: Ileana Roup, MD;  Location: [place];  Service: General;  Laterality: N/A;   EXCISIONAL HEMORRHOIDECTOMY     EXTREMITY CYST EXCISION  1994   right leg   INCISIONAL HERNIA REPAIR N/A 02/16/2016   Procedure: HERNIA REPAIR INCISIONAL;  Surgeon: Fanny Skates, MD;  Location: [facility];  Service: General;  Laterality: N/A;   INSERTION OF MESH  02/16/2016   Procedure: INSERTION OF MESH;  Surgeon: Fanny Skates, MD;  Location: [facility];  Service: General;;   LAPAROSCOPY N/A 06/14/2019   Procedure: ATTEMPTED DIAGNOSTIC LAPAROSCOPY;  Surgeon: Ralene Ok, MD;  Location: [place];  Service: General;  Laterality: N/A;   LAPAROTOMY  03/19/2019   Procedure: Abdominal Wound Exploration with Explantation of Mesh;  Surgeon: Ralene Ok, MD;  Location: [place];  Service: General;;   LAPAROTOMY N/A 06/14/2019   Procedure: EXPLORATORY LAPAROTOMY;  Surgeon: Ralene Ok, MD;  Location: [place];  Service: General;  Laterality: N/A;   LYSIS OF ADHESION N/A 06/14/2019   Procedure: LYSIS OF ADHESIONS;  Surgeon: Ralene Ok, MD;  Location:  [facility];  Service: General;  Laterality: N/A;   RADIOLOGY WITH ANESTHESIA N/A 03/06/2017   Procedure: MRI WITH ANESTHESIA OF CERVICAL SPINE WITHOUT CONTRAST, MRI OF LUMBAR SPINE WITHOUT CONTRAST;  Surgeon: Radiologist, Medication, MD;  Location: [place];  Service: Radiology;  Laterality: N/A;   RADIOLOGY WITH ANESTHESIA N/A 09/15/2018   Procedure: MRI OF BRAIN WITH AND WITHOUT CONTRAST;  Surgeon: Radiologist, Medication, MD;  Location: [place];  Service: Radiology;  Laterality: N/A;   RADIOLOGY WITH ANESTHESIA N/A 10/13/2018   Procedure: MRI OF BRAIN WITH AND WITHOUT CONTRAST;  Surgeon: Radiologist,  Medication, MD;  Location: [place];  Service: Radiology;  Laterality: N/A;   RADIOLOGY WITH ANESTHESIA N/A 10/26/2018   Procedure: MRI UNDER ANESTHESIA; HEAD, CERVICAL AND THORASCIC SPINE;  Surgeon: Radiologist, Medication, MD;  Location: [place];  Service: Radiology;  Laterality: N/A;   RADIOLOGY WITH ANESTHESIA N/A 06/08/2020   Procedure: MRI WITH ANESTHESIA BRAIN WITH AND WITHOUT CONTRAST.,  CERVICAL BRAIN WITH AND WITHOUT CONTRAST, THORACIC SPINE WITH AND WITHOUT;  Surgeon: Radiologist, Medication, MD;  Location: [place];  Service: Radiology;  Laterality: N/A;   RADIOLOGY WITH ANESTHESIA N/A 07/03/2021   Procedure: MRI BRAIN WITH OR WITHOUT CONTRAST WITH ANESTHESIA;  Surgeon: Radiologist, Medication, MD;  Location: [place];  Service: Radiology;  Laterality: N/A;   SCAR REVISION  01/21/2011   Procedure: SCAR REVISION;  Surgeon: Hermelinda Dellen;  Location: [place];  Service: Plastics;  Laterality: N/A;  exploration of scar of abdomen and repair of defect   TRANSRECTAL DRAINAGE OF PELVIC ABSCESS      Prior to Admission medications   Medication Sig Start Date End Date Taking? Authorizing Provider  albuterol (PROVENTIL HFA;VENTOLIN HFA) 108 (90 Base) MCG/ACT inhaler Inhale 2 puffs into the lungs every 6 (six) hours as needed for wheezing or shortness of breath. 02/15/15  Yes Teague Bobbye Morton, PA-C  amLODipine (NORVASC) 5 MG tablet Take 5 mg by mouth daily.  01/23/16  Yes [provider]  amphetamine-dextroamphetamine (ADDERALL XR) 20 MG 24 hr capsule Take 20 mg by mouth daily.   Yes [provider]  cariprazine (VRAYLAR) capsule Take 3 mg by mouth at bedtime. 09/06/19  Yes [provider]  Diethylpropion HCl CR 75 MG TB24 Take 75 mg by mouth daily.   Yes [provider]  divalproex (DEPAKOTE) 500 MG DR tablet Take 1 tablet (500 mg total) by mouth at bedtime. 05/28/21  Yes Cameron Sprang, MD  dronabinol (MARINOL) 2.5 MG capsule TAKE 1 CAPSULE BY  MOUTH 3 TIMES DAILY WITH MEALS. 02/09/21  Yes Annitta Needs, NP  EPINEPHrine 0.3 mg/0.3 mL IJ SOAJ injection Inject 0.3 mg into the muscle as needed for anaphylaxis.   Yes [provider]  famotidine (PEPCID) 40 MG tablet Take 1 tablet (40 mg total) by mouth at bedtime. Patient taking differently: Take 40 mg by mouth daily as needed (heartburn/indigestion.). 12/07/19  Yes Annitta Needs, NP  guanFACINE (TENEX) 1 MG tablet Take 1 mg by mouth at bedtime. 06/01/20  Yes [provider]  loratadine (CLARITIN) 10 MG tablet Take 10 mg by mouth daily as needed for allergies.   Yes [provider]  medroxyPROGESTERone (DEPO-PROVERA) 150 MG/ML injection Inject 150 mg into the muscle every 3 (three) months. 01/12/20  Yes [provider]  Naphazoline-Glycerin (CLEAR EYES REDNESS RELIEF OP) Place 1 drop into both eyes in the morning, at noon, and at bedtime.   Yes [provider]  ocrelizumab 600  mg in sodium chloride 0.9 % 500 mL Inject 600 mg into the vein every 6 (six) months.   Yes [provider]  omeprazole (PRILOSEC) 20 MG capsule Take 1 capsule (20 mg total) by mouth 2 (two) times daily before a meal. 10/31/20  Yes Annitta Needs, NP  oxyCODONE (OXY IR/ROXICODONE) 5 MG immediate release tablet Take 5 mg by mouth in the morning and at bedtime.   Yes [provider]  pregabalin (LYRICA) 150 MG capsule Take 150 mg by mouth daily with lunch.   Yes [provider]  pregabalin (LYRICA) 225 MG capsule Take 1 capsule (225 mg total) by mouth 2 (two) times daily. 11/29/19 06/28/23 Yes Milinda Pointer, MD  rosuvastatin (CRESTOR) 20 MG tablet Take 20 mg by mouth daily. 05/24/21  Yes [provider]  topiramate (TOPAMAX) 100 MG tablet Take 1 tablet three times a day 05/28/21  Yes Cameron Sprang, MD  traZODone (DESYREL) 50 MG tablet Take 100 mg by mouth at bedtime.   Yes [provider]  potassium chloride SA (KLOR-CON M) 20 MEQ tablet  Take 2 tablets (40 mEq total) by mouth daily for 4 days. 07/30/21 08/03/21  Erenest Rasher, PA-C    Allergies as of 05/31/2021 - Review Complete 05/28/2021  Allergen Reaction Noted   Baclofen Hives and Shortness Of Breath 01/15/2011   Duloxetine hcl Shortness Of Breath and Rash 01/15/2011   Gabapentin Shortness Of Breath and Rash 01/15/2011   Monosodium glutamate Anaphylaxis 04/17/2013   Other Shortness Of Breath, Rash, and Other (See Comments) 01/15/2011   Acetaminophen Nausea And Vomiting 11/12/2018   Alprazolam Other (See Comments) 01/15/2011   Amitriptyline Hypertension 01/15/2011   Divalproex sodium Diarrhea, Nausea And Vomiting, and Nausea Only 01/16/2006   Duloxetine Rash 85/46/2703   Magnesium salicylate Hives and Itching 04/17/2013   Modafinil Other (See Comments) 03/16/2018   Rizatriptan Nausea And Vomiting and Other (See Comments) 08/17/2013   Tizanidine Hives 04/17/2013   Adhesive [tape] Other (See Comments) 01/15/2011   Hydrocodone-acetaminophen Hives and Nausea And Vomiting 01/21/2011   Ketorolac tromethamine Nausea Only 10/09/2013   Lamotrigine Rash 08/17/2013   Nsaids Nausea And Vomiting 09/08/2018   Tramadol Nausea And Vomiting 12/04/2013    Family History  Problem Relation Age of Onset   Diabetes Mother    Hypertension Mother    Diabetes Father    Hypertension Father    Arthritis Father    Cancer Maternal Grandmother    Cancer Maternal Grandfather    Alcohol abuse Neg Hx    Anxiety disorder Neg Hx    Bipolar disorder Neg Hx    Drug abuse Neg Hx    Depression Neg Hx    Colon cancer Neg Hx     Social History   Socioeconomic History   Marital status: Single    Spouse name: Not on file   Number of children: 1   Years of education: College   Highest education level: Not on file  Occupational History    Employer: OTHER    Comment: disability  Tobacco Use   Smoking status: Former    Packs/day: 0.25    Years: 10.00    Total pack years: 2.50     Types: Cigarettes    Quit date: 12/2014    Years since quitting: 6.7   Smokeless tobacco: Never  Vaping Use   Vaping Use: Never used  Substance and Sexual Activity   Alcohol use: Not Currently    Comment: rare   Drug  use: Not Currently    Comment: marinol that shows up at thc   Sexual activity: Not Currently    Partners: Male    Birth control/protection: Injection    Comment: Depo Injections - Next inj due in Sept 2023  Other Topics Concern   Not on file  Social History Narrative    Born and raised in [place], [place] by parents. Pt has one younger sister. Pt has a Best boy. Pt worked from 1999-2006. She stopped due to MS and is currently on disability.        Married for less than 1 yr in 1999 that ended in divorce.      Caffeine Use: 1 20oz soda daily and 1 cup of coffee      Lives with daughter apartment first floor   Right handed    Social Determinants of Health   Financial Resource Strain: Not on file  Food Insecurity: Not on file  Transportation Needs: Not on file  Physical Activity: Not on file  Stress: Not on file  Social Connections: Not on file  Intimate Partner Violence: Not on file    Review of Systems: See HPI, otherwise negative ROS  Physical Exam: BP (!) 139/112 (BP Location: Right Arm)   Pulse 78   Temp 98.9 F (37.2 C)   LMP  (LMP Unknown)   SpO2 100%  General:   Alert,  Well-developed, well-nourished, pleasant and cooperative in NAD Neck:  Supple; no masses or thyromegaly. No significant cervical adenopathy. Lungs:  Clear throughout to auscultation.   No wheezes, crackles, or rhonchi. No acute distress. Heart:  Regular rate and rhythm; no murmurs, clicks, rubs,  or gallops. Abdomen: Non-distended, normal bowel sounds.  Soft and nontender without appreciable mass or hepatosplenomegaly.  Pulses:  Normal pulses noted. Extremities:  Without clubbing or edema.  Impression/Plan: 41 year old lady with MS chronic nausea-improved with  Marinol 3 times daily rare emesis.  Describes intermittent esophageal dysphagia.  Year for an EGD.  I have offered the patient an EGD.The risks, benefits, limitations, alternatives and imponderables have been reviewed with the patient. Potential for esophageal dilation, biopsy, etc. have also been reviewed.  Questions have been answered. All parties agreeable.      Notice: This dictation was prepared with Dragon dictation along with smaller phrase technology. Any transcriptional errors that result from this process are unintentional and may not be corrected upon review.

## 2021-08-22 NOTE — Telephone Encounter (Signed)
-----   Message from Daneil Dolin, MD sent at 08/22/2021  7:59 AM EDT ----- Please call in new prescription Protonix 40 mg daily 30 minutes before breakfast dispense 30 with 11 refills.  Thanks.

## 2021-08-22 NOTE — Anesthesia Preprocedure Evaluation (Signed)
Anesthesia Evaluation  Patient identified by MRN, date of birth, ID band Patient awake    Reviewed: Allergy & Precautions, NPO status , Patient's Chart, lab work & pertinent test results  Airway Mallampati: II  TM Distance: >3 FB Neck ROM: Full   Comment: Neck pain Dental  (+) Dental Advisory Given, Missing   Pulmonary neg pulmonary ROS, former smoker,    Pulmonary exam normal breath sounds clear to auscultation       Cardiovascular Exercise Tolerance: Good hypertension, Pt. on medications Normal cardiovascular exam Rhythm:Regular Rate:Normal     Neuro/Psych  Headaches, Seizures - (pseudoseizures),  PSYCHIATRIC DISORDERS Anxiety Depression  Neuromuscular disease (multiple sclerosis)    GI/Hepatic GERD  Medicated and Controlled,(+)     substance abuse  , Bowel resection, colostomy and reversal    Endo/Other  Morbid obesity  Renal/GU negative Renal ROS  negative genitourinary   Musculoskeletal  (+) Arthritis , Osteoarthritis,  Fibromyalgia -, narcotic dependent  Abdominal   Peds negative pediatric ROS (+) ADHD Hematology negative hematology ROS (+)   Anesthesia Other Findings Chronic neck and back pain  Reproductive/Obstetrics negative OB ROS                            Anesthesia Physical Anesthesia Plan  ASA: 3  Anesthesia Plan: General   Post-op Pain Management: Minimal or no pain anticipated   Induction: Intravenous  PONV Risk Score and Plan: Propofol infusion  Airway Management Planned: Nasal Cannula and Natural Airway  Additional Equipment:   Intra-op Plan:   Post-operative Plan:   Informed Consent: I have reviewed the patients History and Physical, chart, labs and discussed the procedure including the risks, benefits and alternatives for the proposed anesthesia with the patient or authorized representative who has indicated his/her understanding and acceptance.      Dental advisory given  Plan Discussed with: CRNA and Surgeon  Anesthesia Plan Comments:         Anesthesia Quick Evaluation

## 2021-08-22 NOTE — Anesthesia Postprocedure Evaluation (Signed)
Anesthesia Post Note  Patient: Janai L Lederman  Procedure(s) Performed: ESOPHAGOGASTRODUODENOSCOPY (EGD) WITH PROPOFOL ESOPHAGEAL DILATION  Patient location during evaluation: Short Stay Anesthesia Type: General Level of consciousness: awake and alert Pain management: pain level controlled Vital Signs Assessment: post-procedure vital signs reviewed and stable Respiratory status: spontaneous breathing Cardiovascular status: blood pressure returned to baseline and stable Postop Assessment: no apparent nausea or vomiting Anesthetic complications: no   No notable events documented.   Last Vitals:  Vitals:   08/22/21 0645 08/22/21 0757  BP: (!) 139/112 112/73  Pulse: 78 81  Resp:  17  Temp: 37.2 C 37.1 C  SpO2: 100% 96%    Last Pain:  Vitals:   08/22/21 0757  TempSrc: Oral  PainSc: 0-No pain                 Sohail Capraro

## 2021-08-22 NOTE — Discharge Instructions (Addendum)
EGD Discharge instructions Please read the instructions outlined below and refer to this sheet in the next few weeks. These discharge instructions provide you with general information on caring for yourself after you leave the hospital. Your doctor may also give you specific instructions. While your treatment has been planned according to the most current medical practices available, unavoidable complications occasionally occur. If you have any problems or questions after discharge, please call your doctor. ACTIVITY You may resume your regular activity but move at a slower pace for the next 24 hours.  Take frequent rest periods for the next 24 hours.  Walking will help expel (get rid of) the air and reduce the bloated feeling in your abdomen.  No driving for 24 hours (because of the anesthesia (medicine) used during the test).  You may shower.  Do not sign any important legal documents or operate any machinery for 24 hours (because of the anesthesia used during the test).  NUTRITION Drink plenty of fluids.  You may resume your normal diet.  Begin with a light meal and progress to your normal diet.  Avoid alcoholic beverages for 24 hours or as instructed by your caregiver.  MEDICATIONS You may resume your normal medications unless your caregiver tells you otherwise.  WHAT YOU CAN EXPECT TODAY You may experience abdominal discomfort such as a feeling of fullness or "gas" pains.  FOLLOW-UP Your doctor will discuss the results of your test with you.  SEEK IMMEDIATE MEDICAL ATTENTION IF ANY OF THE FOLLOWING OCCUR: Excessive nausea (feeling sick to your stomach) and/or vomiting.  Severe abdominal pain and distention (swelling).  Trouble swallowing.  Temperature over 101 F (37.8 C).  Rectal bleeding or vomiting of blood.     You have a small hiatal hernia.  You do have some reflux irritation in your esophagus.  Stop omeprazole/Prilosec  Begin Protonix 40 mg 30 minutes before breakfast.   New prescription will be provided through our office.    Office visit with [place] in 3 months [name])  Patient request, I called [name] [phone] reviewed findings and recommendations.

## 2021-08-22 NOTE — Transfer of Care (Signed)
Immediate Anesthesia Transfer of Care Note  Patient: Emily [name]  Procedure(s) Performed: ESOPHAGOGASTRODUODENOSCOPY (EGD) WITH PROPOFOL ESOPHAGEAL DILATION  Patient Location: Short Stay  Anesthesia Type:General  Level of Consciousness: awake  Airway & Oxygen Therapy: Patient Spontanous Breathing  Post-op Assessment: Report given to RN  Post vital signs: Reviewed and stable  Last Vitals:  Vitals Value Taken Time  BP 112/73 08/22/21 0757  Temp 37.1 C 08/22/21 0757  Pulse 81 08/22/21 0757  Resp 17 08/22/21 0757  SpO2 96 % 08/22/21 0757    Last Pain:  Vitals:   08/22/21 0757  TempSrc: Oral  PainSc: 0-No pain         Complications: No notable events documented.

## 2021-08-27 ENCOUNTER — Encounter (HOSPITAL_COMMUNITY): Payer: Self-pay | Admitting: Internal Medicine

## 2021-09-21 ENCOUNTER — Ambulatory Visit (INDEPENDENT_AMBULATORY_CARE_PROVIDER_SITE_OTHER): Payer: Medicare Other | Admitting: Neurology

## 2021-09-21 DIAGNOSIS — G43709 Chronic migraine without aura, not intractable, without status migrainosus: Secondary | ICD-10-CM

## 2021-09-21 MED ORDER — ONABOTULINUMTOXINA 100 UNITS IJ SOLR
200.0000 [IU] | Freq: Once | INTRAMUSCULAR | Status: AC
  Administered 2021-09-21: 155 [IU] via INTRAMUSCULAR

## 2021-09-21 NOTE — Progress Notes (Signed)
[facility]um Clinic  ° °Procedure Note Botox ° °Attending: Dr. Magaly Pollina ° °Preoperative Diagnosis(es): Chronic migraine ° °Consent obtained from: The patient °Benefits discussed included, but were not limited to decreased muscle tightness, increased joint range of motion, and decreased pain.  Risk discussed included, but were not limited pain and discomfort, bleeding, bruising, excessive weakness, venous thrombosis, muscle atrophy and dysphagia.  Anticipated outcomes of the procedure as well as he risks and benefits of the alternatives to the procedure, and the roles and tasks of the personnel to be involved, were discussed with the patient, and the patient consents to the procedure and agrees to proceed. A copy of the patient medication guide was given to the patient which explains the blackbox warning. ° °Patients identity and treatment sites confirmed Yes.  . ° °Details of Procedure: °Skin was cleaned with alcohol. Prior to injection, the needle plunger was aspirated to make sure the needle was not within a blood vessel.  There was no blood retrieved on aspiration.   ° °Following is a summary of the muscles injected  And the amount of Botulinum toxin used: ° °Dilution °200 units of Botox was reconstituted with 4 ml of preservative free normal saline. °Time of reconstitution: At the time of the office visit (<30 minutes prior to injection)  ° °Injections  °155 total units of Botox was injected with a 30 gauge needle. ° °Injection Sites: °L occipitalis: 15 units- 3 sites  °R occiptalis: 15 units- 3 sites ° °L upper trapezius: 15 units- 3 sites °R upper trapezius: 15 units- 3 sits          °L paraspinal: 10 units- 2 sites °R paraspinal: 10 units- 2 sites ° °Face °L frontalis(2 injection sites):10 units   °R frontalis(2 injection sites):10 units         °L corrugator: 5 units   °R corrugator: 5 units           °Procerus: 5 units   °L temporalis: 20 units °R temporalis: 20 units  ° °Agent:  °200 units of botulinum Type  A (Onobotulinum Toxin type A) was reconstituted with 4 ml of preservative free normal saline.  °Time of reconstitution: At the time of the office visit (<30 minutes prior to injection)  ° ° ° Total injected (Units):  155 ° Total wasted (Units):  45 ° °Patient tolerated procedure well without complications.   °Reinjection is anticipated in 3 months. ° ° °

## 2021-09-26 ENCOUNTER — Telehealth: Payer: Self-pay | Admitting: Neurology

## 2021-09-26 NOTE — Telephone Encounter (Signed)
Clinicals faxed to [phone]

## 2021-09-26 NOTE — Telephone Encounter (Signed)
Emily [name], [employer] called and said they are needing some updated clinicals on the patient for her infusion.

## 2021-10-02 ENCOUNTER — Telehealth (HOSPITAL_COMMUNITY): Payer: Self-pay | Admitting: Pharmacy Technician

## 2021-10-02 ENCOUNTER — Telehealth: Payer: Self-pay | Admitting: Pharmacy Technician

## 2021-10-02 NOTE — Telephone Encounter (Signed)
Patient Advocate Encounter  Prior Authorization for Ocrevus '300MG'$ /10ML solution has been approved.    PA# [id number] Key: Erlene Quan Effective dates: 10/02/2021 through 10/02/2022      Lyndel Safe, [place] Patient Advocate Specialist [facility] Patient Advocate Team Direct Number: [phone]  Fax: [phone]

## 2021-10-02 NOTE — Telephone Encounter (Signed)
ERROR

## 2021-10-24 ENCOUNTER — Telehealth: Payer: Self-pay

## 2021-10-24 DIAGNOSIS — K219 Gastro-esophageal reflux disease without esophagitis: Secondary | ICD-10-CM

## 2021-10-24 DIAGNOSIS — R112 Nausea with vomiting, unspecified: Secondary | ICD-10-CM

## 2021-10-24 NOTE — Telephone Encounter (Signed)
Pt called and LMOVM for me to call her regarding her Rx for Dronabinol 2.5 mg.   Phoned the pt back and was advised by her that [facility] in [place] does not have Dronabinol that it was on back order Monday and today she was told the manufacturer had discontinued it. She spoke to her insurance company and was advised that in the system it shows where it was filled in August but when she had went to get it they told her they didn't have it. (Why call now???). Pt states if she doesn't have it by Friday she will end up in the hospital. She is wanting you to find something that is compatible to it and send to [facility] and they would fill it. I told the pt I have to send you a message and hope a PA is not needed but she states that she was told by her insurance they would approve it to be filled but they may need to speak with you once you find something. Please advise

## 2021-10-24 NOTE — Telephone Encounter (Signed)
I have a Rx from [facility] for pt's Dronabinol that they want to Rx to be written below and faxed back to them. Please advise

## 2021-10-25 MED ORDER — DRONABINOL 2.5 MG PO CAPS: ORAL_CAPSULE | ORAL | 3 refills | Status: DC

## 2021-10-25 NOTE — Telephone Encounter (Signed)
Pt LMOVM where laynes did find 30 pills for her so far. So she is good for now

## 2021-10-25 NOTE — Addendum Note (Signed)
Addended by: Annitta Needs on: 10/25/2021 10:12 AM   Modules accepted: Orders

## 2021-10-25 NOTE — Telephone Encounter (Signed)
noted 

## 2021-10-25 NOTE — Telephone Encounter (Signed)
I printed the order. Just need to fax. Thanks!

## 2021-10-25 NOTE — Telephone Encounter (Signed)
Rx faxed to [facility]

## 2021-10-31 ENCOUNTER — Ambulatory Visit (INDEPENDENT_AMBULATORY_CARE_PROVIDER_SITE_OTHER): Payer: Medicare Other | Admitting: Orthopedic Surgery

## 2021-10-31 ENCOUNTER — Encounter: Payer: Self-pay | Admitting: Orthopedic Surgery

## 2021-10-31 VITALS — Ht 64.0 in | Wt 247.0 lb

## 2021-10-31 DIAGNOSIS — S52121A Displaced fracture of head of right radius, initial encounter for closed fracture: Secondary | ICD-10-CM | POA: Diagnosis not present

## 2021-11-01 ENCOUNTER — Encounter: Payer: Self-pay | Admitting: Orthopedic Surgery

## 2021-11-01 DIAGNOSIS — K219 Gastro-esophageal reflux disease without esophagitis: Secondary | ICD-10-CM

## 2021-11-01 DIAGNOSIS — R112 Nausea with vomiting, unspecified: Secondary | ICD-10-CM

## 2021-11-01 MED ORDER — ONDANSETRON HCL 4 MG PO TABS: 4.0000 mg | ORAL_TABLET | Freq: Three times a day (TID) | ORAL | 1 refills | Status: DC | PRN

## 2021-11-01 NOTE — Telephone Encounter (Signed)
I sent in Zofran as needed for nausea. I don't have anything similar to dronabinol that I can send in.
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
## 2021-11-01 NOTE — Addendum Note (Signed)
Addended by: Annitta Needs on: 11/01/2021 12:31 PM   Modules accepted: Orders

## 2021-11-01 NOTE — Telephone Encounter (Signed)
Pt called today as if you have found a replacement for her Dronabinol. Please advise

## 2021-11-01 NOTE — Telephone Encounter (Signed)
Notified the pt by MyChart of recommendations and Rx being sent in.

## 2021-11-05 MED ORDER — DRONABINOL 2.5 MG PO CAPS: ORAL_CAPSULE | ORAL | 3 refills | Status: DC

## 2021-11-05 NOTE — Telephone Encounter (Signed)
Faxed to [facility] in [place]

## 2021-11-05 NOTE — Telephone Encounter (Signed)
Dena, I can print the prescription. We just need to know where she needs this sent.

## 2021-11-05 NOTE — Telephone Encounter (Signed)
[facility] in [place] ?

## 2021-11-05 NOTE — Telephone Encounter (Signed)
Prescription printed. We just need to fax to [facility] in [place], or she can come pick this up in person.

## 2021-11-28 ENCOUNTER — Telehealth: Payer: Self-pay | Admitting: Neurology

## 2021-11-28 NOTE — Telephone Encounter (Signed)
Pt called in wanting to let DR. Delice Lesch know she is behind on her Ocrevus and is having spasms in her legs and hands. One spasm on 11/02/21 caused her to fall and she broke her arm. She is almost 2 months behind on the medication. Her blood pressure is so high since breaking her arm, they won't do the infusion.
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
## 2021-12-14 ENCOUNTER — Ambulatory Visit: Payer: Medicare Other | Admitting: Neurology

## 2021-12-19 ENCOUNTER — Other Ambulatory Visit: Payer: Self-pay | Admitting: Gastroenterology

## 2021-12-20 ENCOUNTER — Encounter: Payer: Self-pay | Admitting: Internal Medicine

## 2021-12-21 ENCOUNTER — Ambulatory Visit (INDEPENDENT_AMBULATORY_CARE_PROVIDER_SITE_OTHER): Payer: Medicare Other | Admitting: Neurology

## 2021-12-21 DIAGNOSIS — G43709 Chronic migraine without aura, not intractable, without status migrainosus: Secondary | ICD-10-CM

## 2021-12-21 MED ORDER — ONABOTULINUMTOXINA 100 UNITS IJ SOLR
200.0000 [IU] | Freq: Once | INTRAMUSCULAR | Status: AC
  Administered 2021-12-21: 155 [IU] via INTRAMUSCULAR

## 2021-12-21 NOTE — Progress Notes (Signed)
Botulinum Clinic  ° °Procedure Note Botox ° °Attending: Dr. Desten Manor ° °Preoperative Diagnosis(es): Chronic migraine ° °Consent obtained from: The patient °Benefits discussed included, but were not limited to decreased muscle tightness, increased joint range of motion, and decreased pain.  Risk discussed included, but were not limited pain and discomfort, bleeding, bruising, excessive weakness, venous thrombosis, muscle atrophy and dysphagia.  Anticipated outcomes of the procedure as well as he risks and benefits of the alternatives to the procedure, and the roles and tasks of the personnel to be involved, were discussed with the patient, and the patient consents to the procedure and agrees to proceed. A copy of the patient medication guide was given to the patient which explains the blackbox warning. ° °Patients identity and treatment sites confirmed Yes.  . ° °Details of Procedure: °Skin was cleaned with alcohol. Prior to injection, the needle plunger was aspirated to make sure the needle was not within a blood vessel.  There was no blood retrieved on aspiration.   ° °Following is a summary of the muscles injected  And the amount of Botulinum toxin used: ° °Dilution °200 units of Botox was reconstituted with 4 ml of preservative free normal saline. °Time of reconstitution: At the time of the office visit (<30 minutes prior to injection)  ° °Injections  °155 total units of Botox was injected with a 30 gauge needle. ° °Injection Sites: °L occipitalis: 15 units- 3 sites  °R occiptalis: 15 units- 3 sites ° °L upper trapezius: 15 units- 3 sites °R upper trapezius: 15 units- 3 sits          °L paraspinal: 10 units- 2 sites °R paraspinal: 10 units- 2 sites ° °Face °L frontalis(2 injection sites):10 units   °R frontalis(2 injection sites):10 units         °L corrugator: 5 units   °R corrugator: 5 units           °Procerus: 5 units   °L temporalis: 20 units °R temporalis: 20 units  ° °Agent:  °200 units of botulinum Type  A (Onobotulinum Toxin type A) was reconstituted with 4 ml of preservative free normal saline.  °Time of reconstitution: At the time of the office visit (<30 minutes prior to injection)  ° ° ° Total injected (Units):  155 ° Total wasted (Units):  45 ° °Patient tolerated procedure well without complications.   °Reinjection is anticipated in 3 months. ° ° °

## 2022-01-06 ENCOUNTER — Emergency Department (HOSPITAL_COMMUNITY)
Admission: EM | Admit: 2022-01-06 | Discharge: 2022-01-06 | Disposition: A | Discharge status: Home / Self Care | Payer: Medicare Other | Attending: Emergency Medicine | Admitting: Emergency Medicine

## 2022-01-06 ENCOUNTER — Encounter (HOSPITAL_COMMUNITY): Payer: Self-pay

## 2022-01-06 DIAGNOSIS — U071 COVID-19: Secondary | ICD-10-CM | POA: Diagnosis not present

## 2022-01-06 DIAGNOSIS — H9202 Otalgia, left ear: Secondary | ICD-10-CM | POA: Diagnosis present

## 2022-01-06 LAB — RESP PANEL BY RT-PCR (RSV, FLU A&B, COVID)  RVPGX2
Influenza A by PCR: NEGATIVE
Influenza B by PCR: NEGATIVE
Resp Syncytial Virus by PCR: NEGATIVE
SARS Coronavirus 2 by RT PCR: POSITIVE — AB

## 2022-01-06 MED ORDER — NIRMATRELVIR/RITONAVIR (PAXLOVID)TABLET: 3.0000 | ORAL_TABLET | Freq: Two times a day (BID) | ORAL | 0 refills | Status: AC

## 2022-01-06 NOTE — ED Triage Notes (Signed)
Pt reports cough and right ear pain for several days.

## 2022-01-06 NOTE — ED Provider Notes (Signed)
[facility] EMERGENCY DEPARTMENT Provider Note   CSN: [id number] Arrival date & time: 01/06/22  1248     History  Chief Complaint  Patient presents with   Otalgia    Emily [name] is a 41 y.o. female.  Pt complains of a cough congestion and pain in her left ear.  Pt reports she has MS.   The history is provided by the patient. No language interpreter was used.  Otalgia Location:  Left Behind ear:  No abnormality Quality:  Aching Severity:  Moderate Onset quality:  Gradual Duration:  4 days Timing:  Constant Chronicity:  New Relieved by:  Nothing Worsened by:  Nothing Ineffective treatments:  None tried Associated symptoms: rhinorrhea        Home Medications Prior to Admission medications   Medication Sig Start Date End Date Taking? Authorizing Provider  nirmatrelvir/ritonavir (PAXLOVID) 20 x 150 MG & 10 x '100MG'$  TABS Take 3 tablets by mouth 2 (two) times daily for 5 days. Patient GFR is normal Take nirmatrelvir (150 mg) two tablets twice daily for 5 days and ritonavir (100 mg) one tablet twice daily for 5 days. 01/06/22 01/11/22 Yes Fransico Meadow, PA-C  albuterol (PROVENTIL HFA;VENTOLIN HFA) 108 (90 Base) MCG/ACT inhaler Inhale 2 puffs into the lungs every 6 (six) hours as needed for wheezing or shortness of breath. 02/15/15   Jaclyn Prime, Collene Leyden, PA-C  amLODipine (NORVASC) 5 MG tablet Take 5 mg by mouth daily.  01/23/16   [provider]  amphetamine-dextroamphetamine (ADDERALL XR) 20 MG 24 hr capsule Take 20 mg by mouth daily.    [provider]  cariprazine (VRAYLAR) capsule Take 3 mg by mouth at bedtime. 09/06/19   [provider]  Diethylpropion HCl CR 75 MG TB24 Take 75 mg by mouth daily.    [provider]  divalproex (DEPAKOTE) 500 MG DR tablet Take 1 tablet (500 mg total) by mouth at bedtime. 05/28/21   Cameron Sprang, MD  dronabinol (MARINOL) 2.5 MG capsule TAKE 1 CAPSULE BY MOUTH 3 TIMES DAILY WITH MEALS. 11/05/21    Annitta Needs, NP  EPINEPHrine 0.3 mg/0.3 mL IJ SOAJ injection Inject 0.3 mg into the muscle as needed for anaphylaxis.    [provider]  famotidine (PEPCID) 40 MG tablet Take 1 tablet (40 mg total) by mouth at bedtime. Patient taking differently: Take 40 mg by mouth daily as needed (heartburn/indigestion.). 12/07/19   Annitta Needs, NP  guanFACINE (TENEX) 1 MG tablet Take 1 mg by mouth at bedtime. 06/01/20   [provider]  loratadine (CLARITIN) 10 MG tablet Take 10 mg by mouth daily as needed for allergies.    [provider]  medroxyPROGESTERone (DEPO-PROVERA) 150 MG/ML injection Inject 150 mg into the muscle every 3 (three) months. 01/12/20   [provider]  Naphazoline-Glycerin (CLEAR EYES REDNESS RELIEF OP) Place 1 drop into both eyes in the morning, at noon, and at bedtime.    [provider]  ocrelizumab 600 mg in sodium chloride 0.9 % 500 mL Inject 600 mg into the vein every 6 (six) months.    [provider]  omeprazole (PRILOSEC) 20 MG capsule TAKE 1 CAPSULE BY MOUTH TWICE DAILY 12/19/21   Annitta Needs, NP  omeprazole (PRILOSEC) 20 MG capsule TAKE 1 CAPSULE BY MOUTH TWICE DAILY 12/19/21   Annitta Needs, NP  ondansetron (ZOFRAN) 4 MG tablet Take 1 tablet (4 mg total) by mouth every 8 (eight) hours as needed for  nausea or vomiting. 11/01/21   Annitta Needs, NP  oxyCODONE (OXY IR/ROXICODONE) 5 MG immediate release tablet Take 5 mg by mouth in the morning and at bedtime.    [provider]  pantoprazole (PROTONIX) 40 MG tablet Take 1 tablet (40 mg total) by mouth daily. 08/22/21   Rourk, Cristopher Estimable, MD  potassium chloride SA (KLOR-CON M) 20 MEQ tablet Take 2 tablets (40 mEq total) by mouth daily for 4 days. 07/30/21 08/03/21  Erenest Rasher, PA-C  pregabalin (LYRICA) 150 MG capsule Take 150 mg by mouth daily with lunch.    [provider]  pregabalin (LYRICA) 225 MG capsule Take 1 capsule (225 mg total) by mouth 2 (two)  times daily. 11/29/19 06/28/23  Milinda Pointer, MD  rosuvastatin (CRESTOR) 20 MG tablet Take 20 mg by mouth daily. 05/24/21   [provider]  topiramate (TOPAMAX) 100 MG tablet Take 1 tablet three times a day 05/28/21   Cameron Sprang, MD  traZODone (DESYREL) 50 MG tablet Take 100 mg by mouth at bedtime.    [provider]      Allergies    Baclofen, Cymbalta [duloxetine hcl], Gabapentin, Monosodium glutamate, Other, Acetaminophen, Alprazolam, Amitriptyline, Magnesium salicylate, Modafinil, Rizatriptan, Tizanidine, Adhesive [tape], Hydrocodone-acetaminophen, Ketorolac tromethamine, Lamotrigine, Nsaids, and Tramadol    Review of Systems   Review of Systems  HENT:  Positive for ear pain and rhinorrhea.   All other systems reviewed and are negative.   Physical Exam Updated Vital Signs BP (!) 125/105 (BP Location: Right Arm)   Pulse (!) 107   Temp 99.6 F (37.6 C) (Oral)   Resp 17   Ht '5\' 3"'$  (1.6 m)   Wt 112 kg   SpO2 98%   BMI 43.75 kg/m  Physical Exam Vitals and nursing note reviewed.  Constitutional:      Appearance: She is well-developed.  HENT:     Head: Normocephalic.     Right Ear: Tympanic membrane normal.     Left Ear: Tympanic membrane normal.  Cardiovascular:     Rate and Rhythm: Normal rate.  Pulmonary:     Effort: Pulmonary effort is normal.  Abdominal:     General: There is no distension.  Musculoskeletal:        General: Normal range of motion.     Cervical back: Normal range of motion.  Skin:    General: Skin is warm.  Neurological:     General: No focal deficit present.     Mental Status: She is alert and oriented to person, place, and time.  Psychiatric:        Mood and Affect: Mood normal.     ED Results / Procedures / Treatments   Labs (all labs ordered are listed, but only abnormal results are displayed) Labs Reviewed  RESP PANEL BY RT-PCR (RSV, FLU A&B, COVID)  [id number] - Abnormal; Notable for the following components:       Result Value   SARS Coronavirus 2 by RT PCR POSITIVE (*)    All other components within normal limits    EKG None  Radiology No results found.  Procedures Procedures    Medications Ordered in ED Medications - No data to display  ED Course/ Medical Decision Making/ A&P                           Medical Decision Making Pt complains of a cough and congestion.    Amount and/or  Complexity of Data Reviewed Labs: ordered. Decision-making details documented in ED Course.    Details: Labs ordered reviewed and interpreted  Covid is positive  Discussion of management or test interpretation with external provider(s): Pt counseled on results.  Pt reports she has MS and her doctor wants her to be on paxlovid.  Pt has had symptoms for 4 days.  I am not sure paxlovid will be of any benefit.    Risk Prescription drug management.           Final Clinical Impression(s) / ED Diagnoses Final diagnoses:  COVID    Rx / DC Orders ED Discharge Orders          Ordered    nirmatrelvir/ritonavir (PAXLOVID) 20 x 150 MG & 10 x '100MG'$  TABS  2 times daily        01/06/22 1412           An After Visit Summary was printed and given to the patient.    Fransico Meadow, [place] 01/06/22 1618    Fransico Meadow, MD 01/10/22 1037

## 2022-01-08 ENCOUNTER — Ambulatory Visit: Payer: Medicare Other | Admitting: Neurology

## 2022-01-14 ENCOUNTER — Emergency Department (HOSPITAL_COMMUNITY): Payer: Medicare Other

## 2022-01-14 ENCOUNTER — Other Ambulatory Visit: Payer: Self-pay

## 2022-01-14 ENCOUNTER — Telehealth (HOSPITAL_COMMUNITY): Payer: Self-pay | Admitting: Student

## 2022-01-14 ENCOUNTER — Emergency Department (HOSPITAL_COMMUNITY)
Admission: EM | Admit: 2022-01-14 | Discharge: 2022-01-14 | Disposition: A | Discharge status: Home / Self Care | Payer: Medicare Other | Attending: Student | Admitting: Student

## 2022-01-14 ENCOUNTER — Encounter (HOSPITAL_COMMUNITY): Payer: Self-pay | Admitting: *Deleted

## 2022-01-14 DIAGNOSIS — R5383 Other fatigue: Secondary | ICD-10-CM | POA: Diagnosis not present

## 2022-01-14 DIAGNOSIS — I10 Essential (primary) hypertension: Secondary | ICD-10-CM | POA: Diagnosis not present

## 2022-01-14 DIAGNOSIS — R0602 Shortness of breath: Secondary | ICD-10-CM | POA: Insufficient documentation

## 2022-01-14 DIAGNOSIS — Z87891 Personal history of nicotine dependence: Secondary | ICD-10-CM | POA: Insufficient documentation

## 2022-01-14 DIAGNOSIS — J029 Acute pharyngitis, unspecified: Secondary | ICD-10-CM | POA: Insufficient documentation

## 2022-01-14 DIAGNOSIS — Z79899 Other long term (current) drug therapy: Secondary | ICD-10-CM | POA: Diagnosis not present

## 2022-01-14 DIAGNOSIS — J45909 Unspecified asthma, uncomplicated: Secondary | ICD-10-CM | POA: Insufficient documentation

## 2022-01-14 DIAGNOSIS — B349 Viral infection, unspecified: Secondary | ICD-10-CM

## 2022-01-14 DIAGNOSIS — R059 Cough, unspecified: Secondary | ICD-10-CM | POA: Diagnosis not present

## 2022-01-14 DIAGNOSIS — Z20822 Contact with and (suspected) exposure to covid-19: Secondary | ICD-10-CM | POA: Diagnosis not present

## 2022-01-14 DIAGNOSIS — R062 Wheezing: Secondary | ICD-10-CM

## 2022-01-14 LAB — CBC WITH DIFFERENTIAL/PLATELET
Abs Immature Granulocytes: 0.05 10*3/uL (ref 0.00–0.07)
Basophils Absolute: 0.1 10*3/uL (ref 0.0–0.1)
Basophils Relative: 0 %
Eosinophils Absolute: 0.1 10*3/uL (ref 0.0–0.5)
Eosinophils Relative: 1 %
HCT: 43.1 % (ref 36.0–46.0)
Hemoglobin: 14.1 g/dL (ref 12.0–15.0)
Immature Granulocytes: 0 %
Lymphocytes Relative: 15 %
Lymphs Abs: 1.7 10*3/uL (ref 0.7–4.0)
MCH: 29.2 pg (ref 26.0–34.0)
MCHC: 32.7 g/dL (ref 30.0–36.0)
MCV: 89.2 fL (ref 80.0–100.0)
Monocytes Absolute: 0.6 10*3/uL (ref 0.1–1.0)
Monocytes Relative: 5 %
Neutro Abs: 8.9 10*3/uL — ABNORMAL HIGH (ref 1.7–7.7)
Neutrophils Relative %: 79 %
Platelets: 524 10*3/uL — ABNORMAL HIGH (ref 150–400)
RBC: 4.83 MIL/uL (ref 3.87–5.11)
RDW: 13.2 % (ref 11.5–15.5)
WBC: 11.3 10*3/uL — ABNORMAL HIGH (ref 4.0–10.5)
nRBC: 0 % (ref 0.0–0.2)

## 2022-01-14 LAB — COMPREHENSIVE METABOLIC PANEL
ALT: 47 U/L — ABNORMAL HIGH (ref 0–44)
AST: 25 U/L (ref 15–41)
Albumin: 3.8 g/dL (ref 3.5–5.0)
Alkaline Phosphatase: 99 U/L (ref 38–126)
Anion gap: 11 (ref 5–15)
BUN: 10 mg/dL (ref 6–20)
CO2: 25 mmol/L (ref 22–32)
Calcium: 8.9 mg/dL (ref 8.9–10.3)
Chloride: 99 mmol/L (ref 98–111)
Creatinine, Ser: 0.69 mg/dL (ref 0.44–1.00)
GFR, Estimated: >60 mL/min (ref >60)
Glucose, Bld: 87 mg/dL (ref 70–99)
Potassium: 3.4 mmol/L — ABNORMAL LOW (ref 3.5–5.1)
Sodium: 135 mmol/L (ref 135–145)
Total Bilirubin: 0.3 mg/dL (ref 0.3–1.2)
Total Protein: 7.8 g/dL (ref 6.5–8.1)

## 2022-01-14 LAB — RESP PANEL BY RT-PCR (RSV, FLU A&B, COVID)  RVPGX2
Influenza A by PCR: NEGATIVE
Influenza B by PCR: NEGATIVE
Resp Syncytial Virus by PCR: NEGATIVE
SARS Coronavirus 2 by RT PCR: NEGATIVE

## 2022-01-14 LAB — TROPONIN I (HIGH SENSITIVITY)
Troponin I (High Sensitivity): 4 ng/L (ref <18)
Troponin I (High Sensitivity): 3 ng/L (ref <18)

## 2022-01-14 MED ORDER — BENZONATATE 100 MG PO CAPS
200.0000 mg | ORAL_CAPSULE | Freq: Once | ORAL | Status: AC
  Administered 2022-01-14: 200 mg via ORAL
  Filled 2022-01-14: qty 2

## 2022-01-14 MED ORDER — ALBUTEROL SULFATE (2.5 MG/3ML) 0.083% IN NEBU: 2.5000 mg | INHALATION_SOLUTION | Freq: Four times a day (QID) | RESPIRATORY_TRACT | 12 refills | Status: DC | PRN

## 2022-01-14 MED ORDER — BENZONATATE 100 MG PO CAPS: 100.0000 mg | ORAL_CAPSULE | Freq: Three times a day (TID) | ORAL | 0 refills | Status: DC

## 2022-01-14 MED ORDER — ALBUTEROL SULFATE (2.5 MG/3ML) 0.083% IN NEBU: 2.5000 mg | INHALATION_SOLUTION | Freq: Four times a day (QID) | RESPIRATORY_TRACT | 12 refills | Status: AC | PRN

## 2022-01-14 MED ORDER — IPRATROPIUM-ALBUTEROL 0.5-2.5 (3) MG/3ML IN SOLN
3.0000 mL | Freq: Once | RESPIRATORY_TRACT | Status: AC
  Administered 2022-01-14: 3 mL via RESPIRATORY_TRACT
  Filled 2022-01-14: qty 3

## 2022-01-14 NOTE — Telephone Encounter (Signed)
Note created to send patient to new pharmacy

## 2022-01-14 NOTE — ED Notes (Signed)
[facility] did not accept pt signature, did not realize until pt had already walked out.

## 2022-01-14 NOTE — ED Provider Notes (Signed)
[facility] Provider Note  CSN: [id number] Arrival date & time: 01/14/22 0715  Chief Complaint(s) No chief complaint on file.  HPI Emily [name] is a 42 y.o. female with PMH relapsing remitting MS, asthma, pseudoseizures, PTSD who presents emergency department for evaluation of fatigue, cough, sore throat shortness of breath.  Patient states that she was diagnosed with COVID 8 days ago and recently completed her prescription for Paxlovid.  She states that after completing this prescription she started to have worsening congestion and chest tightness with fevers at home.  Currently denies chest pain, abdominal pain, nausea, vomiting or other systemic symptoms.   Past Medical History Past Medical History:  Diagnosis Date   ADHD    Anxiety    Chronic back pain    Conversion disorder    DDD (degenerative disc disease), cervical    Depression    External hemorrhoid    Fatty liver    Fibromyalgia    GERD (gastroesophageal reflux disease)    History of asthma    childhood   History of bowel resection 2009   for perforated bowel--- colostomy bag for 3 motnhs   History of kidney stones    passed stones   HTN (hypertension) 02/27/2015   JC virus antibody positive    Migraines    MS (multiple sclerosis) ([place]) 06/2006   Neuropathy    OA (osteoarthritis)    Ovarian cyst    Panic attack    Pseudoseizures    last seizure was in 08/2018    PTSD (post-traumatic stress disorder)    Seasonal allergies    Urinary urgency    Vertigo    Wears contact lenses    Wears glasses    Patient Active Problem List   Diagnosis Date Noted   Nausea without vomiting 05/30/2020   Abnormal MRI, lumbar spine (03/06/2017) 04/13/2020   Chronic low back pain (Bilateral) w/o sciatica 04/04/2020   Conversion disorder with seizures or convulsions 12/06/2019   Arthropathy of left knee 12/01/2019   Osteoarthritis of knees (Bilateral) 11/02/2019   Anxiety due to invasive procedure  11/02/2019   Unstable knee, left 10/25/2019   S/P small bowel resection 06/14/2019   Infected hernioplasty mesh ([place]) 03/19/2019   Pseudoseizures ([place]) 02/22/2019   ADHD 02/22/2019   Anxiety 02/22/2019   Other chronic pain 01/08/2019   Other cervical disc degeneration, unspecified cervical region 01/08/2019   Personal history of nicotine dependence 01/08/2019   S/P emergency C-section    MS (multiple sclerosis) ([place])    Chronic pain    Panic attack    Open wound of abdominal wall, anterior, complicated, sequela 63/78/5885   Postoperative wound infection 09/20/2018   History of substance use disorder 07/19/2018   Chronic nausea 04/13/2018   Nausea with vomiting 03/23/2018   Pharmacologic therapy 03/16/2018   Disorder of skeletal system 03/16/2018   Problems influencing health status 03/16/2018   Abnormal MRI, cervical spine (03/06/17) 03/16/2018   Morbid obesity with BMI of 45.0-49.9, adult ([place]) 03/16/2018   Chronic knee pain (Bilateral) 02/18/2018   Cervical facet hypertrophy 04/29/2017   Cervical facet syndrome (Bilateral) (R>L) 04/29/2017   Spondylosis without myelopathy or radiculopathy, cervical region 04/29/2017   Spondylosis without myelopathy or radiculopathy, lumbosacral region 03/04/2017   Cervicogenic headache 02/10/2017   Occipital headache 02/10/2017   Trigger point with back pain (Left) 02/10/2017   History of fainting (vasovagal) 02/10/2017   History of vasovagal syncope 02/10/2017   Neurogenic pain 01/23/2017   Chronic musculoskeletal pain 12/02/2016  Cervical radiculitis (Bilateral) 11/12/2016   Chronic upper back pain (1ry area of Pain) (Bilateral) (R>L) 11/01/2016   Chronic hand pain (3ry area of Pain) (Bilateral) (L>R) 11/01/2016   Chronic wrist pain (3ry area of Pain) (Bilateral) (L>R) 11/01/2016   Chronic foot/toes pain (4th area of Pain) (Bilateral) (R>L) 11/01/2016   Chronic upper extremity pain (2ry area of Pain) (Bilateral) (L>R) 11/01/2016   Long  term prescription benzodiazepine use 11/01/2016   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) 11/01/2016   Cervical foraminal stenosis (Bilateral) (C6-7) 11/01/2016   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   DDD (degenerative disc disease), thoracic 11/01/2016   DDD (degenerative disc disease), lumbar 11/01/2016   Lumbar facet arthropathy 11/01/2016   Lumbar facet syndrome (Bilateral) (R>L) 11/01/2016   Vitamin D deficiency 10/28/2016   Opiate use 07/23/2016   Chronic neck pain (1ry area of Pain) (Bilateral) (R>L) 07/23/2016   Chronic low back pain (2ry area of Pain) (Bilateral) (L>R) w/ sciatica (Bilateral) 07/23/2016   Chronic knee pain (5th area of Pain) (Left) 07/23/2016   Midline thoracic back pain 07/23/2016   Chronic lower extremity pain (Bilateral) 07/23/2016   Incarcerated incisional hernia 02/16/2016   Incisional hernia 02/15/2016   Carpal tunnel syndrome (3ry area of Pain) (Bilateral) (L>R) 11/13/2015   Substance abuse ([place]) 09/07/2015   Paresthesia 08/02/2015   Chronic pain syndrome 08/02/2015   Postpartum endometritis 07/21/2015   PTSD (post-traumatic stress disorder) 05/16/2015   Severe episode of recurrent major depressive disorder, without psychotic features ([place]) 05/16/2015   GAD (generalized anxiety disorder) 05/16/2015   Difficult intravenous access 05/08/2015   Right optic neuritis 04/27/2015   Conversion disorder with attacks or seizures, persistent, with psychological stressor 03/28/2015   Migraine with aura and without status migrainosus, not intractable 03/28/2015   Generalized anxiety disorder 03/28/2015   Conversion disorder 03/28/2015   HTN (hypertension) 02/27/2015   Perforated bowel ([place]) 02/27/2015   Depression 12/27/2014   Multiple sclerosis ([place]) 12/27/2014   Migraine 12/27/2014   Seizure disorder ([place]) 12/27/2014   Smoker 12/27/2014   Severe obesity (BMI >= 40) ([place]) 12/27/2014   DDD (degenerative disc disease), cervical  07/28/2014   Major depressive disorder, single episode 11/02/2013   Gastroesophageal reflux disease 10/25/2010   Tonsillitis 10/25/2010   Home Medication(s) Prior to Admission medications   Medication Sig Start Date End Date Taking? Authorizing Provider  albuterol (PROVENTIL HFA;VENTOLIN HFA) 108 (90 Base) MCG/ACT inhaler Inhale 2 puffs into the lungs every 6 (six) hours as needed for wheezing or shortness of breath. 02/15/15   Jaclyn Prime, Collene Leyden, PA-C  amLODipine (NORVASC) 5 MG tablet Take 5 mg by mouth daily.  01/23/16   [provider]  amphetamine-dextroamphetamine (ADDERALL XR) 20 MG 24 hr capsule Take 20 mg by mouth daily.    [provider]  cariprazine (VRAYLAR) capsule Take 3 mg by mouth at bedtime. 09/06/19   [provider]  Diethylpropion HCl CR 75 MG TB24 Take 75 mg by mouth daily.    [provider]  divalproex (DEPAKOTE) 500 MG DR tablet Take 1 tablet (500 mg total) by mouth at bedtime. 05/28/21   Cameron Sprang, MD  dronabinol (MARINOL) 2.5 MG capsule TAKE 1 CAPSULE BY MOUTH 3 TIMES DAILY WITH MEALS. 11/05/21   Annitta Needs, NP  EPINEPHrine 0.3 mg/0.3 mL IJ SOAJ injection Inject 0.3 mg into the muscle as needed for anaphylaxis.    [provider]  famotidine (PEPCID) 40 MG tablet Take 1 tablet (40 mg  total) by mouth at bedtime. Patient taking differently: Take 40 mg by mouth daily as needed (heartburn/indigestion.). 12/07/19   Annitta Needs, NP  guanFACINE (TENEX) 1 MG tablet Take 1 mg by mouth at bedtime. 06/01/20   [provider]  loratadine (CLARITIN) 10 MG tablet Take 10 mg by mouth daily as needed for allergies.    [provider]  medroxyPROGESTERone (DEPO-PROVERA) 150 MG/ML injection Inject 150 mg into the muscle every 3 (three) months. 01/12/20   [provider]  Naphazoline-Glycerin (CLEAR EYES REDNESS RELIEF OP) Place 1 drop into both eyes in the morning, at noon, and at bedtime.    [provider]  ocrelizumab 600 mg in sodium chloride 0.9 % 500 mL Inject 600 mg into the vein every 6 (six) months.    [provider]  omeprazole (PRILOSEC) 20 MG capsule TAKE 1 CAPSULE BY MOUTH TWICE DAILY 12/19/21   Annitta Needs, NP  omeprazole (PRILOSEC) 20 MG capsule TAKE 1 CAPSULE BY MOUTH TWICE DAILY 12/19/21   Annitta Needs, NP  ondansetron (ZOFRAN) 4 MG tablet Take 1 tablet (4 mg total) by mouth every 8 (eight) hours as needed for nausea or vomiting. 11/01/21   Annitta Needs, NP  oxyCODONE (OXY IR/ROXICODONE) 5 MG immediate release tablet Take 5 mg by mouth in the morning and at bedtime.    [provider]  pantoprazole (PROTONIX) 40 MG tablet Take 1 tablet (40 mg total) by mouth daily. 08/22/21   Rourk, Cristopher Estimable, MD  potassium chloride SA (KLOR-CON M) 20 MEQ tablet Take 2 tablets (40 mEq total) by mouth daily for 4 days. 07/30/21 08/03/21  Erenest Rasher, PA-C  pregabalin (LYRICA) 150 MG capsule Take 150 mg by mouth daily with lunch.    [provider]  pregabalin (LYRICA) 225 MG capsule Take 1 capsule (225 mg total) by mouth 2 (two) times daily. 11/29/19 06/28/23  Milinda Pointer, MD  rosuvastatin (CRESTOR) 20 MG tablet Take 20 mg by mouth daily. 05/24/21   [provider]  topiramate (TOPAMAX) 100 MG tablet Take 1 tablet three times a day 05/28/21   Cameron Sprang, MD  traZODone (DESYREL) 50 MG tablet Take 100 mg by mouth at bedtime.    [provider]                                                                                                                                    Past Surgical History Past Surgical History:  Procedure Laterality Date   ABDOMINAL SURGERY     ABDOMINAL WOUND EXPLORATION   03/19/2019   Abdominal Wound Exploration with Explantation of Mesh   APPENDECTOMY     BOWEL RESECTION  01/2007   with colostomy   BOWEL RESECTION N/A 06/14/2019   Procedure: SMALL BOWEL RESECTION;  Surgeon: Ralene Ok, MD;   Location: [facility];  Service: General;  Laterality: N/A;   CESAREAN  SECTION N/A 07/12/2015   Procedure: CESAREAN SECTION;  Surgeon: Guss Bunde, MD;  Location: [facility];  Service: Obstetrics;  Laterality: N/A;   COLON SURGERY     part of colon and intestines removed; colostomy 2008   COLONOSCOPY WITH PROPOFOL N/A 01/29/2017   Procedure: COLONOSCOPY WITH PROPOFOL;  Surgeon: Ileana Roup, MD;  Location: [facility];  Service: General;  Laterality: N/A;   COLOSTOMY CLOSURE  04/2007   ESOPHAGEAL DILATION  08/22/2021   Procedure: ESOPHAGEAL DILATION;  Surgeon: Daneil Dolin, MD;  [facility];  Service: Endoscopy;;   ESOPHAGOGASTRODUODENOSCOPY (EGD) WITH PROPOFOL N/A 08/22/2021   Procedure: ESOPHAGOGASTRODUODENOSCOPY (EGD) WITH PROPOFOL;  Surgeon: Daneil Dolin, MD;  [facility];  Service: Endoscopy;  Laterality: N/A;  9:30am, asa 3   EVALUATION UNDER ANESTHESIA WITH HEMORRHOIDECTOMY N/A 06/11/2018   Procedure: ANORECTAL EXAM UNDER ANESTHESIA WITH HEMORRHOIDECTOMY;  Surgeon: Ileana Roup, MD;  Location: [facility];  Service: General;  Laterality: N/A;   EXCISIONAL HEMORRHOIDECTOMY     EXTREMITY CYST EXCISION  1994   right leg   INCISIONAL HERNIA REPAIR N/A 02/16/2016   Procedure: HERNIA REPAIR INCISIONAL;  Surgeon: Fanny Skates, MD;  [facility];  Service: General;  Laterality: N/A;   INSERTION OF MESH  02/16/2016   Procedure: INSERTION OF MESH;  Surgeon: Fanny Skates, MD;  [facility];  Service: General;;   LAPAROSCOPY N/A 06/14/2019   Procedure: ATTEMPTED DIAGNOSTIC LAPAROSCOPY;  Surgeon: Ralene Ok, MD;  Location: [facility];  Service: General;  Laterality: N/A;   LAPAROTOMY  03/19/2019   Procedure: Abdominal Wound Exploration with Explantation of Mesh;  Surgeon: Ralene Ok, MD;  Location: [facility];  Service: General;;   LAPAROTOMY N/A 06/14/2019   Procedure: EXPLORATORY LAPAROTOMY;  Surgeon: Ralene Ok, MD;  Location: [facility];  Service: General;  Laterality: N/A;   LYSIS OF ADHESION N/A 06/14/2019   Procedure: LYSIS OF ADHESIONS;  Surgeon: Ralene Ok, MD;  Location: [facility];  Service: General;  Laterality: N/A;   RADIOLOGY WITH ANESTHESIA N/A 03/06/2017   Procedure: MRI WITH ANESTHESIA OF CERVICAL SPINE WITHOUT CONTRAST, MRI OF LUMBAR SPINE WITHOUT CONTRAST;  Surgeon: Radiologist, Medication, MD;  Location: [facility];  Service: Radiology;  Laterality: N/A;   RADIOLOGY WITH ANESTHESIA N/A 09/15/2018   Procedure: MRI OF BRAIN WITH AND WITHOUT CONTRAST;  Surgeon: Radiologist, Medication, MD;  Location: [facility];  Service: Radiology;  Laterality: N/A;   RADIOLOGY WITH ANESTHESIA N/A 10/13/2018   Procedure: MRI OF BRAIN WITH AND WITHOUT CONTRAST;  Surgeon: Radiologist, Medication, MD;  Location: [facility];  Service: Radiology;  Laterality: N/A;   RADIOLOGY WITH ANESTHESIA N/A 10/26/2018   Procedure: MRI UNDER ANESTHESIA; HEAD, CERVICAL AND THORASCIC SPINE;  Surgeon: Radiologist, Medication, MD;  Location: [facility];  Service: Radiology;  Laterality: N/A;   RADIOLOGY WITH ANESTHESIA N/A 06/08/2020   Procedure: MRI WITH ANESTHESIA BRAIN WITH AND WITHOUT CONTRAST.,  CERVICAL BRAIN WITH AND WITHOUT CONTRAST, THORACIC SPINE WITH AND WITHOUT;  Surgeon: Radiologist, Medication, MD;  Location: [facility];  Service: Radiology;  Laterality: N/A;   RADIOLOGY WITH ANESTHESIA N/A 07/03/2021   Procedure: MRI BRAIN WITH OR WITHOUT CONTRAST WITH ANESTHESIA;  Surgeon: Radiologist, Medication, MD;  Location: [facility];  Service: Radiology;  Laterality: N/A;   SCAR REVISION  01/21/2011   Procedure: SCAR REVISION;  Surgeon: Hermelinda Dellen;  Location: [facility];  Service: Plastics;  Laterality: N/A;  exploration of scar of abdomen and repair  of defect   TRANSRECTAL DRAINAGE OF PELVIC ABSCESS     Family History Family History  Problem Relation Age of Onset   Diabetes Mother    Hypertension Mother    Diabetes  Father    Hypertension Father    Arthritis Father    Cancer Maternal Grandmother    Cancer Maternal Grandfather    Alcohol abuse Neg Hx    Anxiety disorder Neg Hx    Bipolar disorder Neg Hx    Drug abuse Neg Hx    Depression Neg Hx    Colon cancer Neg Hx     Social History Social History   Tobacco Use   Smoking status: Former    Packs/day: 0.25    Years: 10.00    Total pack years: 2.50    Types: Cigarettes    Quit date: 12/2014    Years since quitting: 7.1   Smokeless tobacco: Never  Vaping Use   Vaping Use: Never used  Substance Use Topics   Alcohol use: Not Currently    Comment: rare   Drug use: Not Currently    Comment: marinol that shows up at thc   Allergies Baclofen, Cymbalta [duloxetine hcl], Gabapentin, Monosodium glutamate, Other, Acetaminophen, Alprazolam, Amitriptyline, Magnesium salicylate, Modafinil, Rizatriptan, Tizanidine, Adhesive [tape], Hydrocodone-acetaminophen, Ketorolac tromethamine, Lamotrigine, Nsaids, and Tramadol  Review of Systems Review of Systems  Constitutional:  Positive for chills, fatigue and fever.  HENT:  Positive for sinus pressure.   Respiratory:  Positive for chest tightness.   Neurological:  Positive for headaches.    Physical Exam Vital Signs  I have reviewed the triage vital signs There were no vitals taken for this visit.  Physical Exam Vitals and nursing note reviewed.  Constitutional:      General: She is not in acute distress.    Appearance: She is well-developed.  HENT:     Head: Normocephalic and atraumatic.  Eyes:     Conjunctiva/sclera: Conjunctivae normal.  Cardiovascular:     Rate and Rhythm: Normal rate and regular rhythm.     Heart sounds: No murmur heard. Pulmonary:     Effort: Pulmonary effort is normal. No respiratory distress.     Breath sounds: Wheezing present.  Abdominal:     Palpations: Abdomen is soft.     Tenderness: There is no abdominal tenderness.  Musculoskeletal:        General: No  swelling.     Cervical back: Neck supple.  Skin:    General: Skin is warm and dry.     Capillary Refill: Capillary refill takes less than 2 seconds.  Neurological:     Mental Status: She is alert.  Psychiatric:        Mood and Affect: Mood normal.     ED Results and Treatments Labs (all labs ordered are listed, but only abnormal results are displayed) Labs Reviewed - No data to display  Radiology No results found.  Pertinent labs & imaging results that were available during my care of the patient were reviewed by me and considered in my medical decision making (see MDM for details).  Medications Ordered in ED Medications - No data to display                                                                                                                                   Procedures Procedures  (including critical care time)  Medical Decision Making / ED Course   This patient presents to the ED for concern of cough, shortness of breath, this involves an extensive number of treatment options, and is a complaint that carries with it a high risk of complications and morbidity.  The differential diagnosis includes viral URI, postviral cough, pneumonia, electrolyte abnormality, ACS  MDM: Patient seen the emergency room for evaluation of cough and shortness of breath.  Exam with some mild wheezing but is otherwise unremarkable.  Laboratory evaluation with some mild hypokalemia to 3.4 and a leukocytosis to 11.3.  COVID, flu, RSV negative.  High-sensitivity troponin negative.  Chest x-ray unremarkable.  Patient received a single DuoNeb and symptoms significantly improved.  At this time, patient is not hypoxic and with symptoms improved she does not meet inpatient criteria for admission.  She will be discharged with [facility] and nebulizer solution.  I did try to write  the patient a paper prescription for a nebulizer to see if insurance will assist patient with covering this device but also gave her information on how to buy 1 online.  Patient then discharged with outpatient follow-up and strict return precautions which she voiced understanding.   Additional history obtained:  -External records from outside source obtained and reviewed including: Chart review including previous notes, labs, imaging, consultation notes   Lab Tests: -I ordered, reviewed, and interpreted labs.   The pertinent results include:   Labs Reviewed - No data to display       Imaging Studies ordered: I ordered imaging studies including chest x-ray I independently visualized and interpreted imaging. I agree with the radiologist interpretation   Medicines ordered and prescription drug management: No orders of the defined types were placed in this encounter.   -I have reviewed the patients home medicines and have made adjustments as needed  Critical interventions none   Cardiac Monitoring: The patient was maintained on a cardiac monitor.  I personally viewed and interpreted the cardiac monitored which showed an underlying rhythm of: NSR  Social Determinants of Health:  Factors impacting patients care include: none   Reevaluation: After the interventions noted above, I reevaluated the patient and found that they have :improved  Co morbidities that complicate the patient evaluation  Past Medical History:  Diagnosis Date   ADHD    Anxiety    Chronic back pain    Conversion disorder    DDD (degenerative disc disease), cervical  Depression    External hemorrhoid    Fatty liver    Fibromyalgia    GERD (gastroesophageal reflux disease)    History of asthma    childhood   History of bowel resection 2009   for perforated bowel--- colostomy bag for 3 motnhs   History of kidney stones    passed stones   HTN (hypertension) 02/27/2015   JC virus antibody  positive    Migraines    MS (multiple sclerosis) ([place]) 06/2006   Neuropathy    OA (osteoarthritis)    Ovarian cyst    Panic attack    Pseudoseizures    last seizure was in 08/2018    PTSD (post-traumatic stress disorder)    Seasonal allergies    Urinary urgency    Vertigo    Wears contact lenses    Wears glasses       Dispostion: I considered admission for this patient, but at this time she does not meet inpatient criteria for admission and she is safe for discharge and outpatient follow-up     Final Clinical Impression(s) / ED Diagnoses Final diagnoses:  None     '@PCDICTATION'$ @    Delmo Matty, Debe Coder, MD 01/14/22 1526

## 2022-01-14 NOTE — ED Triage Notes (Signed)
Pt c/o sore throat and chest pain and generalized body aches  Pt states she was diagnosed with covid x 8 days ago and has not been getting any better; pt states her temp at home has been as high as 103

## 2022-01-15 ENCOUNTER — Other Ambulatory Visit: Payer: Self-pay

## 2022-01-15 ENCOUNTER — Telehealth: Payer: Self-pay

## 2022-01-15 DIAGNOSIS — R112 Nausea with vomiting, unspecified: Secondary | ICD-10-CM

## 2022-01-15 DIAGNOSIS — K219 Gastro-esophageal reflux disease without esophagitis: Secondary | ICD-10-CM

## 2022-01-15 MED ORDER — DRONABINOL 2.5 MG PO CAPS: ORAL_CAPSULE | ORAL | 3 refills | Status: DC

## 2022-01-15 NOTE — Telephone Encounter (Signed)
Pt phoned to advise that she had a insurance change and her Rx [facility] [place] [facility]. A new script has to be sent. Pharmacy changed in the system

## 2022-01-15 NOTE — Addendum Note (Signed)
Addended by: Annitta Needs on: 01/15/2022 03:26 PM   Modules accepted: Orders

## 2022-01-15 NOTE — Telephone Encounter (Signed)
Noted. I printed prescription. I am unable to fax it. Thanks!

## 2022-01-15 NOTE — Telephone Encounter (Signed)
Hi Cy[place],   Vicente Males advises she is not able to fax but we sent it electronically   thanks

## 2022-01-16 ENCOUNTER — Telehealth: Payer: Self-pay

## 2022-01-16 DIAGNOSIS — K219 Gastro-esophageal reflux disease without esophagitis: Secondary | ICD-10-CM

## 2022-01-16 DIAGNOSIS — R112 Nausea with vomiting, unspecified: Secondary | ICD-10-CM

## 2022-01-16 NOTE — Telephone Encounter (Signed)
PA done on Cover My Meds for Dronabinol 2.'5mg'$  capsules. Waiting on Cover My Meds for a response.

## 2022-01-16 NOTE — Telephone Encounter (Signed)
Pt had pt had here insurance company to call advising to call in her Dronabinol. I advised the rep that you had already done this. This Rx is denied. This is not the same insurance from before.

## 2022-01-16 NOTE — Telephone Encounter (Signed)
PA denied and Cover My Meds sent over appeal paperwork. Pt's insurance denied. Paperwork on your desk in the yellow folder for you to see the denial reasons.

## 2022-01-17 MED ORDER — DRONABINOL 2.5 MG PO CAPS: 2.5000 mg | ORAL_CAPSULE | Freq: Two times a day (BID) | ORAL | 3 refills | Status: DC

## 2022-01-17 NOTE — Addendum Note (Signed)
Addended by: Annitta Needs on: 01/17/2022 12:26 PM   Modules accepted: Orders

## 2022-01-17 NOTE — Telephone Encounter (Signed)
Due to quantity limit of 60, I have reprinted prescription to be take BID, disp#:60.

## 2022-01-17 NOTE — Telephone Encounter (Signed)
[facility] pt's Rx and paperwork regarding her Dronabinol. Pt's Rx was written for 60 pills instead of 90 due to her insurance. Waiting to see if approved.

## 2022-01-17 NOTE — Telephone Encounter (Signed)
Paperwork and Rx was faxed to [facility].

## 2022-01-18 DIAGNOSIS — R112 Nausea with vomiting, unspecified: Secondary | ICD-10-CM

## 2022-01-18 DIAGNOSIS — K219 Gastro-esophageal reflux disease without esophagitis: Secondary | ICD-10-CM

## 2022-01-18 NOTE — Telephone Encounter (Signed)
Paperwork put on Dr's desk to fill out once again with different questions. Dx wasn't good enough to use

## 2022-01-22 ENCOUNTER — Other Ambulatory Visit: Payer: Self-pay

## 2022-01-22 ENCOUNTER — Encounter: Payer: Self-pay | Admitting: Gastroenterology

## 2022-01-22 MED ORDER — DRONABINOL 2.5 MG PO CAPS: 2.5000 mg | ORAL_CAPSULE | Freq: Two times a day (BID) | ORAL | 3 refills | Status: DC

## 2022-01-22 NOTE — Progress Notes (Signed)
Appeal letter completed.  Please fax to [phone]. Thanks!

## 2022-01-22 NOTE — Telephone Encounter (Signed)
Did this get worked out?

## 2022-01-22 NOTE — Telephone Encounter (Signed)
Emily [name] from [facility] phoned and advised of pt needing her Rx for Marinol. Our Dx keeps getting denied. Pt sent [place] a MyChart message stating that she has dx of relapse remitting spastic multiple sclerosis. I advised her [name]) we are GI and that the Dr who diagnosed the pt with this has to be the one who gets the PA because we have only used nausea Dx here. We don't have the documentation where we diagnosed her with that condition. She ( Emily [name] from [facility] will contact the pt back to get the Dr's information from the pt who diagnosed her.

## 2022-01-22 NOTE — Telephone Encounter (Signed)
I have printed the prescription again. Thanks!

## 2022-01-22 NOTE — Telephone Encounter (Signed)
Emily [name]:  Next round of paperwork faxed to [employer] and [facility].   Verdis Frederickson from [facility] phoned again @ 4:46 pm asking had paperwork been faxed she was advised that everything that has hit our desk we have faxed it and a new Rx plus a letter from [name], [place]. Our Dx we are using was faxed as well. Verdis Frederickson advised with our Dx pt would not get it most likely. Advised we have done everything that we possibly can on our end. Was advised she has a better understanding now of what this facility does and the code we used.

## 2022-01-22 NOTE — Telephone Encounter (Signed)
All paperwork given to Vicente Males for this pt

## 2022-01-22 NOTE — Telephone Encounter (Signed)
Pt is still denied. Pt's paperwork on the way to you

## 2022-01-22 NOTE — Telephone Encounter (Signed)
Appeal letter completed.   Please fax to [phone]. Thanks!

## 2022-01-22 NOTE — Telephone Encounter (Signed)
Paperwork faxed to [facility] @ [phone]. Paperwork will be given to Manuela Schwartz once approved or not

## 2022-01-23 ENCOUNTER — Telehealth: Payer: Self-pay

## 2022-01-23 NOTE — Telephone Encounter (Signed)
There is a [facility] Fax in the yellow folder on your desk to verify the pt's instructions to Dronabinol

## 2022-01-23 NOTE — Telephone Encounter (Signed)
FYICyd Silence sent over documentation " response to inquiry" advising that [facility] ([place] is the pt's Rx drug [facility] provider.  Paperwork on your desk in the yellow folder

## 2022-01-25 ENCOUNTER — Telehealth: Payer: Self-pay

## 2022-01-25 NOTE — Telephone Encounter (Signed)
Pt needs new Rx for Dronabinol sent to [facility] in [place]. States she switched insurance company to [place]. She states she will bite the bullet and pay $30.00 for a refill until this gets straightened out

## 2022-01-28 ENCOUNTER — Telehealth: Payer: Self-pay

## 2022-01-28 NOTE — Telephone Encounter (Signed)
Emily [name],   The pharmacists from [facility] phoned and you have 2 sets of directions on the pt's Dronabinol 2.5 mg. Please call [phone], with reference number [id number]. They will the pt's Rx until they hear from [place].

## 2022-01-28 NOTE — Telephone Encounter (Signed)
I called, and they are saying it needs a PA. I don't know where to go from here. Thanks!

## 2022-01-29 NOTE — Telephone Encounter (Signed)
PA done on [facility]. Waiting on a response. Tried/failed : Meclizine and Ondansetron

## 2022-01-30 ENCOUNTER — Telehealth: Payer: Self-pay

## 2022-01-30 NOTE — Telephone Encounter (Signed)
Pt just phoned stating [facility] does not have Rx in stock. Pt wants to know can you transfer Rx to [facility] in [place].

## 2022-01-30 NOTE — Telephone Encounter (Signed)
error 

## 2022-01-30 NOTE — Telephone Encounter (Signed)
Yes, you can send the prescription that I printed last week to [facility]. It is a controlled substance, so I can't send electronically

## 2022-01-30 NOTE — Telephone Encounter (Signed)
[facility] prior approval confirmation page sent to [facility] for scan. Pt notified of the approval. Starting February 1st pt will go back to [place]

## 2022-01-30 NOTE — Telephone Encounter (Signed)
Pt called back where it is on back order with everyone. Some pharmacies 2 months its been that way. Pt states she has been out of it since the 11th. Her PCP advised her to do whatever she had to do to survive. He advised her to try the CBD Gummies. The pt stated she tried them and they helped her. She can't eat as much but she can eat taking them. She is wanting you to find a solution for her.

## 2022-03-07 ENCOUNTER — Encounter: Payer: Self-pay | Admitting: Radiology

## 2022-03-22 ENCOUNTER — Ambulatory Visit (INDEPENDENT_AMBULATORY_CARE_PROVIDER_SITE_OTHER): Payer: Medicare Other | Admitting: Neurology

## 2022-03-22 DIAGNOSIS — G43709 Chronic migraine without aura, not intractable, without status migrainosus: Secondary | ICD-10-CM

## 2022-03-22 MED ORDER — ONABOTULINUMTOXINA 100 UNITS IJ SOLR
200.0000 [IU] | Freq: Once | INTRAMUSCULAR | Status: AC
  Administered 2022-03-22: 155 [IU] via INTRAMUSCULAR

## 2022-03-22 NOTE — Progress Notes (Signed)
[facility]  ° °Procedure Note Botox ° °Attending: Dr. Loghan Kurtzman ° °Preoperative Diagnosis(es): Chronic migraine ° °Consent obtained from: The patient °Benefits discussed included, but were not limited to decreased muscle tightness, increased joint range of motion, and decreased pain.  Risk discussed included, but were not limited pain and discomfort, bleeding, bruising, excessive weakness, venous thrombosis, muscle atrophy and dysphagia.  Anticipated outcomes of the procedure as well as he risks and benefits of the alternatives to the procedure, and the roles and tasks of the personnel to be involved, were discussed with the patient, and the patient consents to the procedure and agrees to proceed. A copy of the patient medication guide was given to the patient which explains the blackbox warning. ° °Patients identity and treatment sites confirmed Yes.  . ° °Details of Procedure: °Skin was cleaned with alcohol. Prior to injection, the needle plunger was aspirated to make sure the needle was not within a blood vessel.  There was no blood retrieved on aspiration.   ° °Following is a summary of the muscles injected  And the amount of Botulinum toxin used: ° °Dilution °200 units of Botox was reconstituted with 4 ml of preservative free normal saline. °Time of reconstitution: At the time of the office visit (<30 minutes prior to injection)  ° °Injections  °155 total units of Botox was injected with a 30 gauge needle. ° °Injection Sites: °L occipitalis: 15 units- 3 sites  °R occiptalis: 15 units- 3 sites ° °L upper trapezius: 15 units- 3 sites °R upper trapezius: 15 units- 3 sits          °L paraspinal: 10 units- 2 sites °R paraspinal: 10 units- 2 sites ° °Face °L frontalis(2 injection sites):10 units   °R frontalis(2 injection sites):10 units         °L corrugator: 5 units   °R corrugator: 5 units           °Procerus: 5 units   °L temporalis: 20 units °R temporalis: 20 units  ° °Agent:  °200 units of botulinum Type  A (Onobotulinum Toxin type A) was reconstituted with 4 ml of preservative free normal saline.  °Time of reconstitution: At the time of the office visit (<30 minutes prior to injection)  ° ° ° Total injected (Units):  155 ° Total wasted (Units):  45 ° °Patient tolerated procedure well without complications.   °Reinjection is anticipated in 3 months. ° ° °

## 2022-06-19 ENCOUNTER — Other Ambulatory Visit: Payer: Self-pay

## 2022-06-19 ENCOUNTER — Ambulatory Visit (INDEPENDENT_AMBULATORY_CARE_PROVIDER_SITE_OTHER): Payer: Medicare Other | Admitting: Neurology

## 2022-06-19 ENCOUNTER — Telehealth: Payer: Self-pay | Admitting: Neurology

## 2022-06-19 ENCOUNTER — Encounter: Payer: Self-pay | Admitting: Neurology

## 2022-06-19 VITALS — HR 74 | Resp 20 | Ht 64.0 in | Wt 248.0 lb

## 2022-06-19 DIAGNOSIS — G35 Multiple sclerosis: Secondary | ICD-10-CM

## 2022-06-19 DIAGNOSIS — G8929 Other chronic pain: Secondary | ICD-10-CM

## 2022-06-19 DIAGNOSIS — E559 Vitamin D deficiency, unspecified: Secondary | ICD-10-CM

## 2022-06-19 DIAGNOSIS — I639 Cerebral infarction, unspecified: Secondary | ICD-10-CM

## 2022-06-19 DIAGNOSIS — R32 Unspecified urinary incontinence: Secondary | ICD-10-CM

## 2022-06-19 DIAGNOSIS — G43709 Chronic migraine without aura, not intractable, without status migrainosus: Secondary | ICD-10-CM | POA: Diagnosis not present

## 2022-06-19 DIAGNOSIS — G40909 Epilepsy, unspecified, not intractable, without status epilepticus: Secondary | ICD-10-CM

## 2022-06-19 DIAGNOSIS — G43109 Migraine with aura, not intractable, without status migrainosus: Secondary | ICD-10-CM

## 2022-06-19 DIAGNOSIS — G35D Multiple sclerosis, unspecified: Secondary | ICD-10-CM

## 2022-06-19 MED ORDER — DIVALPROEX SODIUM 500 MG PO DR TAB: 500.0000 mg | DELAYED_RELEASE_TABLET | Freq: Every day | ORAL | 3 refills | Status: DC

## 2022-06-19 MED ORDER — TOPIRAMATE 100 MG PO TABS: ORAL_TABLET | ORAL | 3 refills | Status: DC

## 2022-06-19 NOTE — Patient Instructions (Addendum)
Good to see you doing well.  Continue Depakote 500mg  every night and Topiramate 100mg  three times a day  2. Have bloodwork done for CBC, CMP, Immunoglobulins, vitamin D  3. Schedule MRI brain with and without contrast, MRI cervical spine with and without contrast, and MRI thoracic spine with and without contrast  4. Referral will be sent to Urology for urinary incontinence  5. Order for Poise pads and walker with seat will be sent to [facility]  6. Continue Ocrevus infusions and Botox  7. Follow-up in 6 months, call for any changes

## 2022-06-19 NOTE — Telephone Encounter (Signed)
New message    Patient was seen today have questions regarding today's visit.

## 2022-06-19 NOTE — Progress Notes (Addendum)
NEUROLOGY FOLLOW UP OFFICE NOTE  Emily [name] [id number] 1980/05/29  HISTORY OF PRESENT ILLNESS: I had the pleasure of seeing Emily [name] in follow-up in the neurology clinic on 06/19/2022.  The patient was last seen a year ago for MS, Migraines, neuropathy, and psychogenic non-epileptic events. Her daughter Emily [name] is present for the visit. Records and images were personally reviewed where available. Since her last visit, interval MRI brain/cervical/thoracic spine done 06/2021 did not show any new lesions. There were numerous foci of FLAIR signal abnormality in the supratentorial white matter, patchy cord signal in the dorsal central cord at C2-3 and C4, right aspect of cord at T4, central cord at T6-7, unchanged from prior, no new or enhancing lesions seen. She reports that she is overall doing well. No MS flares. She has urinary incontinence, mostly stress incontinence when coughing, sneezing, bending over. She uses Poise pads. She has a cane that she uses when she gets tired, but now needs a walker with seat to help  Migraines are significantly improved with Botox, she does well the 3 months after Botox, and starts getting one the last week prior to next Botox. She may get a tension or stress headache in between, but nothing major. She continues on Depakote 500mg  qhs and Topiramate 100mg  TID for migraine prophylaxis in addition to Botox. 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 She has some tremors/shakiness but nothing that would progress. She is on Lyrica prescribed by Pain Management.  In October 2022, she had a spasm of her right leg which led to a fall. She fractured her right arm. She had surgery and since then has been having difficulty with supination/pronation of right arm. There is numbness in the 4 fingers of her right hand down to the elbow. She has difficulty doing things around the house, cleaning her hair, washing, writing, doing things around the  house. She lives with her daughter. She continues to follow-up with Psychiatry and doing well from that standpoint.    History on Initial Assessment 03/28/2015: This is a pleasant 42 yo RH woman with a history of relapsing-remitting multiple sclerosis, headaches, anxiety with panic attacks, conversion disorder with seizures. She presented at [redacted] weeks pregnant to determine if diazepam for anxiety/seizures would be a medication she can use, understanding this is pregnancy category D versus risks of injuring herself from falls when she has the psychogenic seizures. Records from her previous neurologists at [facility] and [facility] were reviewed. She was diagnosed with MS in June 2008 while living in [place] state, she started having right thigh pain and right eye blurred vision. She was diagnosed with right optic neuritis and underwent MRI brain and lumbar puncture which confirmed a diagnosis of MS. 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 She has been on Gilenya since 2014 without any relapse. This was discontinued in December 2016 when she found out she was pregnant. She has a history of chronic pain and migraines, and was taking Depakote and Topamax for headache prophylaxis, also stopped in December 2016. She reports having Botox for her migraines, but so far migraines are not as bad as they were.  She started having shaking episodes in April 2016. Initially Depakote dose was increased to 500mg  BID. She was back in May 2016 with multiple episodes of syncope, palpitations, a sensation of her heart jumping out of her chest.  She also reported lightheadedness, facial numbness, and numbness in her hands and feet. On her last visit in June 2016 at [facility], shaking episodes were discussed. Events always occur after a stressful event. She had a spell in [place] where she had a normal  EEG and was diagnosed with a conversion disorder. She had seen Dr. Jannifer Franklin and reportedly told her he could not help her because "her case was too complex." She started seeing a psychiatrist in [place], however since she switched neurologists, she reports that he cannot see her anymore. She has not been able to establish care with [facility] and continues to have anxiety and panic attacks "all the time." She is diaphoretic in the office today and reports she always feels anxious. When she starts having one of her seizures, she would have significant anxiety, "my body can't deal and goes into a seizure," she hears her heartbeat in her ears. If it slowly builds up, she can get herself to a safe place, however other times it "just hits me," and she falls to the ground and has injured herself. She reports falling out one time in the bathroom during a doctor visit. She is currently on Fluoxetine and Buspirone. Clonazepam in the past did not help. She would usually go to the ER for these spells, which would quiet down once she gets Benadryl and Diazepam.  She delivered early at [redacted] weeks gestation last July 12, 2015. A week after delivery, she started having numbness on the top of both feet, pain in the balls of her feet, with numbness traveling up to her mid-back. She also has numbness in both forearms, and reports her palms feel the same as her soles, with constant stabbing burning pain. She had these symptoms pre-pregnancy but states the numbness only went up to her knees. During pregnancy, all her symptoms were less bothersome. She has been following with Pain management in [facility] and would occasionally take Oxycodone prior to her pregnancy.  Diagnostic Data:   I personally reviewed MRI brain with and without contrast done with [facility] last 10/02/13 which did not show any acute changes, there was multiple T2/FLAIR lesions in the subcortical and deep white matter regions, no abnormal enhancement.  Hippocampi appear asymmetric, smaller on the right without abnormal signal or enhancement.   MRI C-spine with and without contrast 10/18/13 showed hazy T2 hyperintensity within the cord from C2-3 to C4-5 without abnormal enhancement.  per [facility] - MRI brain done 03/18/14: "No change in numerous multifocal T2 hyperintense white matter lesions relative to prior MRI from 04/17/2013 most consistent with demyelinating lesions given history of multiple sclerosis. No evidence of active demyelination."  MRI cervical spine done 03/18/14: "Relative to prior MRI from 10/20/2009, no change in patchy foci of increased T2 signal in the dorsal cord at C2- C4 in keeping with demyelinating lesions in this patient with a clinical history of multiple sclerosis.   No evidence of enhancement to suggest active demyelination. Moderately advanced degenerative disease at C5-C6 and C6-C7 with interval development of left eccentric central, foraminal entry zone and foraminal disc extrusion at C6-C7 exerting mass effect on the exiting C7 and descending C8 roots.  MRI brain and C-spine 07/30/2015 with and without contrast : There was no abnormal enhancement. There were no changes from previous MRI brain in 09/2013 with multiple bilateral subcortical greater than 10 periventricular T2 hyperintensities. There were no cord lesions seen up to T3-4. There was chronic degenerative disc disease and central canal stenosis at C5-6  and C6-7, no abnormal cord signal, unchanged from 06/2014.  MRI brain, cervical and thoracic spine with and without contrast in October 2020 did not show any new lesions, no abnormal enhancement. Stable findings with multiple scatted foci of FLAIR signal in the periventricular, deep, and juxtacortical white matter, subtle patchy cord signal abnormality at C2-4. She has degenerative disc osteophyte at C6-7 with moderate spinal stenosis, mild mass effect on the left greater than right spinal cord, advanced degenerative disc  osteophyte at C5-6 with mild spinal stenosis and mild mass effect on left hemicord. Thoracic spine showed question subtle patchy cord signal abnormality at level of T6-7, T8-9, T9-10, no active demyelination. Mild degenerative spondylosis with no stenosis.  EMG/NCV done on both UE and right LE showed bilateral carpal tunnel syndrome, mild to moderate in degree, no evidence of polyneuropathy or radiculopathy on the right side.  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   History of kidney stones    passed stones   HTN (hypertension) 02/27/2015   JC virus antibody positive    Migraines    MS (multiple sclerosis) (HCC) 06/2006   Neuropathy    OA (osteoarthritis)    Ovarian cyst    Panic attack    Pseudoseizures    last seizure was in 08/2018    PTSD (post-traumatic stress disorder)    Seasonal allergies    Urinary urgency    Vertigo    Wears contact lenses    Wears glasses     MEDICATIONS: Current Outpatient Medications on File Prior to Visit  Medication Sig Dispense Refill   albuterol (PROVENTIL HFA;VENTOLIN HFA) 108 (90 Base) MCG/ACT inhaler Inhale 2 puffs into the lungs every 6 (six) hours as needed for wheezing or shortness of breath. 1 Inhaler 0   albuterol (PROVENTIL) (2.5 MG/3ML) 0.083% nebulizer solution Take 3 mLs (2.5 mg total) by nebulization every 6 (six) hours as needed for wheezing or shortness of breath. 75 mL 12   amLODipine (NORVASC) 5 MG tablet Take 5 mg by mouth daily.   2   amphetamine-dextroamphetamine (ADDERALL XR) 20 MG 24 hr capsule Take 20 mg by mouth daily.     benzonatate (TESSALON) 100 MG capsule  Take 1 capsule (100 mg total) by mouth every 8 (eight) hours. 21 capsule 0   cariprazine (VRAYLAR) capsule Take 3 mg by mouth at bedtime.     Diethylpropion HCl CR 75 MG TB24 Take 75 mg by mouth daily.     divalproex (DEPAKOTE) 500 MG DR tablet Take 1 tablet (500 mg total) by mouth at bedtime. 90 tablet 3   dronabinol (MARINOL) 2.5 MG capsule Take 1 capsule (2.5 mg total) by mouth 2 (two) times daily before lunch and supper. TAKE 1 CAPSULE BY MOUTH 3 TIMES DAILY WITH MEALS. 60 capsule 3   EPINEPHrine 0.3 mg/0.3 mL IJ SOAJ injection Inject 0.3 mg into the muscle as needed for anaphylaxis.     famotidine (PEPCID) 40 MG tablet Take 1 tablet (40 mg total) by mouth at bedtime. (Patient taking differently:  Take 40 mg by mouth daily as needed (heartburn/indigestion.).) 90 tablet 3   guanFACINE (TENEX) 1 MG tablet Take 1 mg by mouth at bedtime.     loratadine (CLARITIN) 10 MG tablet Take 10 mg by mouth daily as needed for allergies.     medroxyPROGESTERone (DEPO-PROVERA) 150 MG/ML injection Inject 150 mg into the muscle every 3 (three) months.     Naphazoline-Glycerin (CLEAR EYES REDNESS RELIEF OP) Place 1 drop into both eyes in the morning, at noon, and at bedtime.     ocrelizumab 600 mg in sodium chloride 0.9 % 500 mL Inject 600 mg into the vein every 6 (six) months.     omeprazole (PRILOSEC) 20 MG capsule TAKE 1 CAPSULE BY MOUTH TWICE DAILY 60 capsule 5   omeprazole (PRILOSEC) 20 MG capsule TAKE 1 CAPSULE BY MOUTH TWICE DAILY 60 capsule 5   ondansetron (ZOFRAN) 4 MG tablet Take 1 tablet (4 mg total) by mouth every 8 (eight) hours as needed for nausea or vomiting. 30 tablet 1   oxyCODONE (OXY IR/ROXICODONE) 5 MG immediate release tablet Take 5 mg by mouth in the morning and at bedtime.     pantoprazole (PROTONIX) 40 MG tablet Take 1 tablet (40 mg total) by mouth daily. 30 tablet 11   pregabalin (LYRICA) 150 MG capsule Take 150 mg by mouth daily with lunch.     pregabalin (LYRICA) 225 MG capsule Take 1  capsule (225 mg total) by mouth 2 (two) times daily. 60 capsule 2   rosuvastatin (CRESTOR) 20 MG tablet Take 20 mg by mouth daily.     topiramate (TOPAMAX) 100 MG tablet Take 1 tablet three times a day 270 tablet 3   traZODone (DESYREL) 50 MG tablet Take 100 mg by mouth at bedtime.     potassium chloride SA (KLOR-CON M) 20 MEQ tablet Take 2 tablets (40 mEq total) by mouth daily for 4 days. 8 tablet 0   Current Facility-Administered Medications on File Prior to Visit  Medication Dose Route Frequency Provider Last Rate Last Admin   metoCLOPramide (REGLAN) injection 10 mg  10 mg Intravenous Once Bethann Berkshire, MD        ALLERGIES: Allergies  Allergen Reactions   Baclofen Hives and Shortness Of Breath   Cymbalta [Duloxetine Hcl] Shortness Of Breath and Rash   Gabapentin Shortness Of Breath and Rash   Monosodium Glutamate Anaphylaxis   Other Shortness Of Breath, Rash and Other (See Comments)    MSG, beans (vomiting) MSG causes hives, itching, throat swelling   Acetaminophen Nausea And Vomiting    Projectile vomiting   Alprazolam Other (See Comments)    Lethargy Pt states dosage was just too high; does well with other benzos   Amitriptyline Hypertension   Magnesium Salicylate Hives and Itching   Modafinil Other (See Comments)    Seizures    Rizatriptan Nausea And Vomiting and Other (See Comments)    GI upset, Projectile vomiting    Tizanidine Hives    GI Upset (intolerance)   Adhesive [Tape] Other (See Comments)    Skin irritation Paper tape is ok   Hydrocodone-Acetaminophen Hives and Nausea And Vomiting    Projectile vomiting   Ketorolac Tromethamine Nausea Only    Toradol    Lamotrigine Rash   Nsaids Nausea And Vomiting    Irritate stomach    Tramadol Nausea And Vomiting    GI Upset (intolerance)    FAMILY HISTORY: Family History  Problem Relation Age of Onset   Diabetes Mother  Hypertension Mother    Diabetes Father    Hypertension Father    Arthritis Father     Cancer Maternal Grandmother    Cancer Maternal Grandfather    Alcohol abuse Neg Hx    Anxiety disorder Neg Hx    Bipolar disorder Neg Hx    Drug abuse Neg Hx    Depression Neg Hx    Colon cancer Neg Hx     SOCIAL HISTORY: Social History   Socioeconomic History   Marital status: Single    Spouse name: Not on file   Number of children: 1   Years of education: College   Highest education level: Not on file  Occupational History    Employer: OTHER    Comment: disability  Tobacco Use   Smoking status: Former    Packs/day: 0.25    Years: 10.00    Additional pack years: 0.00    Total pack years: 2.50    Types: Cigarettes    Quit date: 12/2014    Years since quitting: 7.5   Smokeless tobacco: Never  Vaping Use   Vaping Use: Never used  Substance and Sexual Activity   Alcohol use: Not Currently    Comment: rare   Drug use: Not Currently    Comment: marinol that shows up at thc   Sexual activity: Not Currently    Partners: Male    Birth control/protection: Injection    Comment: Depo Injections - Next inj due in Sept 2023  Other Topics Concern   Not on file  Social History Narrative    Born and raised in [place], [place] by parents. Pt has one younger sister. 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 She stopped due to MS and is currently on disability.        Married for less than 1 yr in 1999 that ended in divorce.      Caffeine Use: 1 20oz soda daily and 1 cup of coffee      Lives with daughter apartment first floor   Right handed    Social Determinants of Health   Financial Resource Strain: Not on file  Food Insecurity: Not on file  Transportation Needs: Not on file  Physical Activity: Not on file  Stress: Not on file  Social Connections: Not on file  Intimate Partner Violence: Not on file     PHYSICAL EXAM: Vitals:   06/19/22 0829  Pulse: 74  Resp: 20  SpO2: 98%   General: No acute distress Head:   Normocephalic/atraumatic Skin/Extremities: No rash, no edema Neurological Exam: alert and awake. No aphasia or dysarthria. Fund of knowledge is appropriate.  Attention and concentration are normal.   Cranial nerves: Pupils equal, round. Extraocular movements intact with no nystagmus. Visual fields full.  No facial asymmetry.  Motor: Bulk and tone normal, muscle strength 5/5 throughout with no pronator drift, pain on testing right arm.   Finger to nose testing intact.  Gait narrow-based and steady, no ataxia. No significant tremors today.   IMPRESSION: This is a 42 yo RH woman with a history of relapsing-remitting multiple sclerosis, migraine with aura, anxiety with panic attacks, conversion disorder with psychogenic seizures, and neuropathy. She is overall doing well. Migraines have responded well to Botox. She is also on Depakote 500mg  qhs and Topiramate 100mg  TID. She is on Ocrevus infusion every 6 months for MS. Safety labs (CBC, CMP, immunoglobulins) will be ordered. Annual MRI brain, cervical, and thoracic spine with and without contrast  will be ordered, she needs sedation for these studies. She reports more urinary incontinence and asks for a prescription for Poise pads. She will also be referred to Urology. Due to easy fatigability and gait difficulties from MS and back pain, prescription for walker with seat will also be provided. She needs help at home for ADLs after fracturing her right arm, PCS form will be filled out. She has not had any psychogenic non-epileptic events since 2021, continue follow-up with Behavioral Health. She is aware of [place] driving laws to stop driving after an episode of loss of consciousness until 6 months event-free. Follow-up in 6 months, call for any changes.    Thank you for allowing me to participate in her care.  Please do not hesitate to call for any questions or concerns.    Patrcia Dolly, M.D.   CC: [place]el [place], NP

## 2022-06-19 NOTE — Telephone Encounter (Signed)
Pt called she stated that she seen in her notes that it said she could not care for her daughter, she said she can care for her she just has trouble washing her hair and putting it ina pony tail. Pt stated that she can drive, she got that back last year

## 2022-06-19 NOTE — Addendum Note (Signed)
Addended by: Dimas Chyle on: 06/19/2022 02:22 PM   Modules accepted: Orders

## 2022-06-19 NOTE — Telephone Encounter (Signed)
Noted and addended.

## 2022-06-21 ENCOUNTER — Ambulatory Visit: Payer: Medicare Other | Admitting: Neurology

## 2022-06-28 ENCOUNTER — Telehealth: Payer: Self-pay | Admitting: Neurology

## 2022-06-28 NOTE — Telephone Encounter (Signed)
Pt is calling in wanting to see if her PCS pw can be refaxed to [phone] due to [facility] is stating that they have not rec'd it as of today.  Pt would like to have a call back once it has been faxed.

## 2022-06-30 NOTE — Telephone Encounter (Signed)
Done, thanks

## 2022-07-01 ENCOUNTER — Telehealth: Payer: Self-pay | Admitting: Neurology

## 2022-07-01 NOTE — Telephone Encounter (Signed)
Paperwork faxed in for pt

## 2022-07-01 NOTE — Telephone Encounter (Signed)
Pt is calling to see if her paperwork is done please call

## 2022-07-01 NOTE — Telephone Encounter (Signed)
Paperwork faxed °

## 2022-07-05 ENCOUNTER — Telehealth: Payer: Self-pay | Admitting: Neurology

## 2022-07-05 NOTE — Telephone Encounter (Signed)
Paperwork refaxed.

## 2022-07-05 NOTE — Telephone Encounter (Signed)
Pt is calling in wanting to know how many pages were faxed and if it is not two she would has a website to get it from Medicaid.[email or web address] This the form #[id number].

## 2022-07-05 NOTE — Telephone Encounter (Signed)
Pt is call in stating that [facility] did not receive all pages back. Pt is wanting to know if there was a page 2 if so they are needing it back.

## 2022-07-09 NOTE — Progress Notes (Unsigned)
Emily [name] October 01, 1980 [id number]  History of Present Illness: Emily [name] is a 42 y.o. female who presents today as a new patient at [facility]. All available relevant medical records have been reviewed.  - GU /GYN History: 1. Kidney stones. CT abdomen/pelvis w/ contrast on 06/07/2019 showed "unchanged punctate nonobstructing lower pole renal calculi."  She reports multiple urologic concerns including: - Stress incontinence with cough/laugh/sneeze.  - Urge incontinence.  - Kidney stones.  She reports the UUI is predominant.  She leaks multiple times per day. Alternatives between pads / pullups; wears 2 per day. She reports urinary incontinence is significantly bothersome and have been a problem for >10 years.   She reports urinary frequency and urgency. She reports voiding "every hour" during the per day. She reports nocturia. She reports voiding 2 times per night.  She denies prior attempted treatment for these symptoms. She reports routine caffeine intake (20 oz of soda per day; denies coffee or tea intake).  She denies history of obstructive sleep apnea; states she had a negative sleep study >7 years ago. She denies fluid intake within 3 hours prior to bedtime. She denies fluid intake during the night.  She denies caffeine intake within 8 hours prior to bedtime. She denies routinely experiencing lower extremity edema during the day.  She reports passing stones at least twice per year; most recently passed a stone about 1 month ago. She denies acute flank or stone pain at this time.   She denies dysuria, gross hematuria, straining to void, or sensations of incomplete emptying.  Reports a "heavy feeling" in the bladder when she needs to void. Denies feeling or seeing a vaginal bulge.   She reports dry eyes at baseline; uses Clear Eyes drops often.   Fall Screening: Do you usually have a device to assist in your mobility? No   Medications: Current  Outpatient Medications  Medication Sig Dispense Refill   albuterol (PROVENTIL HFA;VENTOLIN HFA) 108 (90 Base) MCG/ACT inhaler Inhale 2 puffs into the lungs every 6 (six) hours as needed for wheezing or shortness of breath. 1 Inhaler 0   albuterol (PROVENTIL) (2.5 MG/3ML) 0.083% nebulizer solution Take 3 mLs (2.5 mg total) by nebulization every 6 (six) hours as needed for wheezing or shortness of breath. 75 mL 12   amLODipine (NORVASC) 5 MG tablet Take 5 mg by mouth daily.   2   amphetamine-dextroamphetamine (ADDERALL XR) 20 MG 24 hr capsule Take 20 mg by mouth daily.     benzonatate (TESSALON) 100 MG capsule Take 1 capsule (100 mg total) by mouth every 8 (eight) hours. 21 capsule 0   cariprazine (VRAYLAR) capsule Take 3 mg by mouth at bedtime.     Diethylpropion HCl CR 75 MG TB24 Take 75 mg by mouth daily.     divalproex (DEPAKOTE) 500 MG DR tablet Take 1 tablet (500 mg total) by mouth at bedtime. 90 tablet 3   dronabinol (MARINOL) 2.5 MG capsule Take 1 capsule (2.5 mg total) by mouth 2 (two) times daily before lunch and supper. TAKE 1 CAPSULE BY MOUTH 3 TIMES DAILY WITH MEALS. 60 capsule 3   EPINEPHrine 0.3 mg/0.3 mL IJ SOAJ injection Inject 0.3 mg into the muscle as needed for anaphylaxis.     famotidine (PEPCID) 40 MG tablet Take 1 tablet (40 mg total) by mouth at bedtime. (Patient taking differently: Take 40 mg by mouth daily as needed (heartburn/indigestion.).) 90 tablet 3   guanFACINE (TENEX) 1 MG tablet Take 1  mg by mouth at bedtime.     loratadine (CLARITIN) 10 MG tablet Take 10 mg by mouth daily as needed for allergies.     medroxyPROGESTERone (DEPO-PROVERA) 150 MG/ML injection Inject 150 mg into the muscle every 3 (three) months.     mirabegron ER (MYRBETRIQ) 25 MG TB24 tablet Take 1 tablet (25 mg total) by mouth daily. 30 tablet 11   Naphazoline-Glycerin (CLEAR EYES REDNESS RELIEF OP) Place 1 drop into both eyes in the morning, at noon, and at bedtime.     ocrelizumab 600 mg in sodium  chloride 0.9 % 500 mL Inject 600 mg into the vein every 6 (six) months.     omeprazole (PRILOSEC) 20 MG capsule TAKE 1 CAPSULE BY MOUTH TWICE DAILY 60 capsule 5   omeprazole (PRILOSEC) 20 MG capsule TAKE 1 CAPSULE BY MOUTH TWICE DAILY 60 capsule 5   ondansetron (ZOFRAN) 4 MG tablet Take 1 tablet (4 mg total) by mouth every 8 (eight) hours as needed for nausea or vomiting. 30 tablet 1   oxyCODONE (OXY IR/ROXICODONE) 5 MG immediate release tablet Take 5 mg by mouth in the morning and at bedtime.     pantoprazole (PROTONIX) 40 MG tablet Take 1 tablet (40 mg total) by mouth daily. 30 tablet 11   pregabalin (LYRICA) 150 MG capsule Take 150 mg by mouth daily with lunch.     pregabalin (LYRICA) 225 MG capsule Take 1 capsule (225 mg total) by mouth 2 (two) times daily. 60 capsule 2   rosuvastatin (CRESTOR) 20 MG tablet Take 20 mg by mouth daily.     tamsulosin (FLOMAX) 0.4 MG CAPS capsule Take 1 capsule by mouth as needed when stone symptoms occur. 30 capsule 0   topiramate (TOPAMAX) 100 MG tablet Take 1 tablet three times a day 270 tablet 3   traZODone (DESYREL) 50 MG tablet Take 100 mg by mouth at bedtime.     potassium chloride SA (KLOR-CON M) 20 MEQ tablet Take 2 tablets (40 mEq total) by mouth daily for 4 days. 8 tablet 0   No current facility-administered medications for this visit.   Facility-Administered Medications Ordered in Other Visits  Medication Dose Route Frequency Provider Last Rate Last Admin   metoCLOPramide (REGLAN) injection 10 mg  10 mg Intravenous Once Bethann Berkshire, MD        Allergies: Allergies  Allergen Reactions   Baclofen Hives and Shortness Of Breath   Cymbalta [Duloxetine Hcl] Shortness Of Breath and Rash   Gabapentin Shortness Of Breath and Rash   Monosodium Glutamate Anaphylaxis   Other Shortness Of Breath, Rash and Other (See Comments)    MSG, beans (vomiting) MSG causes hives, itching, throat swelling   Acetaminophen Nausea And Vomiting    Projectile  vomiting   Alprazolam Other (See Comments)    Lethargy Pt states dosage was just too high; does well with other benzos   Amitriptyline Hypertension   Magnesium Salicylate Hives and Itching   Modafinil Other (See Comments)    Seizures    Rizatriptan Nausea And Vomiting and Other (See Comments)    GI upset, Projectile vomiting    Tizanidine Hives    GI Upset (intolerance)   Adhesive [Tape] Other (See Comments)    Skin irritation Paper tape is ok   Hydrocodone-Acetaminophen Hives and Nausea And Vomiting    Projectile vomiting   Ketorolac Tromethamine Nausea Only    Toradol    Lamotrigine Rash   Nsaids Nausea And Vomiting    Irritate stomach  Tramadol Nausea And Vomiting    GI Upset (intolerance)    Past Medical History:  Diagnosis Date   ADHD    Anxiety    Chronic back pain    Conversion disorder    DDD (degenerative disc disease), cervical    Depression    External hemorrhoid    Fatty liver    Fibromyalgia    GERD (gastroesophageal reflux disease)    History of asthma    childhood   History of bowel resection 2009   for perforated bowel--- colostomy bag for 3 motnhs   History of kidney stones    passed stones   HTN (hypertension) 02/27/2015   JC virus antibody positive    Migraines    MS (multiple sclerosis) (HCC) 06/2006   Neuropathy    OA (osteoarthritis)    Ovarian cyst    Panic attack    Pseudoseizures    last seizure was in 08/2018    PTSD (post-traumatic stress disorder)    Seasonal allergies    Urinary urgency    Vertigo    Wears contact lenses    Wears glasses    Past Surgical History:  Procedure Laterality Date   ABDOMINAL SURGERY     ABDOMINAL WOUND EXPLORATION   03/19/2019   Abdominal Wound Exploration with Explantation of Mesh   APPENDECTOMY     BOWEL RESECTION  01/2007   with colostomy   BOWEL RESECTION N/A 06/14/2019   Procedure: SMALL BOWEL RESECTION;  Surgeon: Axel Filler, MD;  Location: [facility] OR;  Service: General;  Laterality:  N/A;   CESAREAN SECTION N/A 07/12/2015   Procedure: CESAREAN SECTION;  Surgeon: Lesly Dukes, MD;  Location: [facility];  Service: Obstetrics;  Laterality: N/A;   COLON SURGERY     part of colon and intestines removed; colostomy 2008   COLONOSCOPY WITH PROPOFOL N/A 01/29/2017   Procedure: COLONOSCOPY WITH PROPOFOL;  Surgeon: Andria Meuse, MD;  Location: [facility];  Service: General;  Laterality: N/A;   COLOSTOMY CLOSURE  04/2007   ESOPHAGEAL DILATION  08/22/2021   Procedure: ESOPHAGEAL DILATION;  Surgeon: Corbin Ade, MD;  Location: [facility];  Service: Endoscopy;;   ESOPHAGOGASTRODUODENOSCOPY (EGD) WITH PROPOFOL N/A 08/22/2021   Procedure: ESOPHAGOGASTRODUODENOSCOPY (EGD) WITH PROPOFOL;  Surgeon: Corbin Ade, MD;  Location: [facility];  Service: Endoscopy;  Laterality: N/A;  9:30am, asa 3   EVALUATION UNDER ANESTHESIA WITH HEMORRHOIDECTOMY N/A 06/11/2018   Procedure: ANORECTAL EXAM UNDER ANESTHESIA WITH HEMORRHOIDECTOMY;  Surgeon: Andria Meuse, MD;  Location: [facility];  Service: General;  Laterality: N/A;   EXCISIONAL HEMORRHOIDECTOMY     EXTREMITY CYST EXCISION  1994   right leg   INCISIONAL HERNIA REPAIR N/A 02/16/2016   Procedure: HERNIA REPAIR INCISIONAL;  Surgeon: Claud Kelp, MD;  Location: [facility];  Service: General;  Laterality: N/A;   INSERTION OF MESH  02/16/2016   Procedure: INSERTION OF MESH;  Surgeon: Claud Kelp, MD;  Location: [facility];  Service: General;;   LAPAROSCOPY N/A 06/14/2019   Procedure: ATTEMPTED DIAGNOSTIC LAPAROSCOPY;  Surgeon: Axel Filler, MD;  Location: [facility] OR;  Service: General;  Laterality: N/A;   LAPAROTOMY  03/19/2019   Procedure: Abdominal Wound Exploration with Explantation of Mesh;  Surgeon: Axel Filler, MD;  Location: [facility] OR;  Service: General;;   LAPAROTOMY N/A 06/14/2019   Procedure: EXPLORATORY LAPAROTOMY;  Surgeon: Axel Filler, MD;  Location: [facility] OR;  Service: General;   Laterality: N/A;   LYSIS OF ADHESION N/A  06/14/2019   Procedure: LYSIS OF ADHESIONS;  Surgeon: Axel Filler, MD;  Location: [facility] OR;  Service: General;  Laterality: N/A;   RADIOLOGY WITH ANESTHESIA N/A 03/06/2017   Procedure: MRI WITH ANESTHESIA OF CERVICAL SPINE WITHOUT CONTRAST, MRI OF LUMBAR SPINE WITHOUT CONTRAST;  Surgeon: Radiologist, Medication, MD;  Location: [facility] OR;  Service: Radiology;  Laterality: N/A;   RADIOLOGY WITH ANESTHESIA N/A 09/15/2018   Procedure: MRI OF BRAIN WITH AND WITHOUT CONTRAST;  Surgeon: Radiologist, Medication, MD;  Location: [facility] OR;  Service: Radiology;  Laterality: N/A;   RADIOLOGY WITH ANESTHESIA N/A 10/13/2018   Procedure: MRI OF BRAIN WITH AND WITHOUT CONTRAST;  Surgeon: Radiologist, Medication, MD;  Location: [facility] OR;  Service: Radiology;  Laterality: N/A;   RADIOLOGY WITH ANESTHESIA N/A 10/26/2018   Procedure: MRI UNDER ANESTHESIA; HEAD, CERVICAL AND THORASCIC SPINE;  Surgeon: Radiologist, Medication, MD;  Location: [facility] OR;  Service: Radiology;  Laterality: N/A;   RADIOLOGY WITH ANESTHESIA N/A 06/08/2020   Procedure: MRI WITH ANESTHESIA BRAIN WITH AND WITHOUT CONTRAST.,  CERVICAL BRAIN WITH AND WITHOUT CONTRAST, THORACIC SPINE WITH AND WITHOUT;  Surgeon: Radiologist, Medication, MD;  Location: [facility] OR;  Service: Radiology;  Laterality: N/A;   RADIOLOGY WITH ANESTHESIA N/A 07/03/2021   Procedure: MRI BRAIN WITH OR WITHOUT CONTRAST WITH ANESTHESIA;  Surgeon: Radiologist, Medication, MD;  Location: [facility] OR;  Service: Radiology;  Laterality: N/A;   SCAR REVISION  01/21/2011   Procedure: SCAR REVISION;  Surgeon: Rosalio Macadamia;  Location: [facility];  Service: Plastics;  Laterality: N/A;  exploration of scar of abdomen and repair of defect   TRANSRECTAL DRAINAGE OF PELVIC ABSCESS     Family History  Problem Relation Age of Onset   Diabetes Mother    Hypertension Mother    Diabetes Father    Hypertension Father    Arthritis Father    Cancer  Maternal Grandmother    Cancer Maternal Grandfather    Alcohol abuse Neg Hx    Anxiety disorder Neg Hx    Bipolar disorder Neg Hx    Drug abuse Neg Hx    Depression Neg Hx    Colon cancer Neg Hx    Social History   Socioeconomic History   Marital status: Single    Spouse name: Not on file   Number of children: 1   Years of education: College   Highest education level: Not on file  Occupational History    Employer: OTHER    Comment: disability  Tobacco Use   Smoking status: Former    Packs/day: 0.25    Years: 10.00    Additional pack years: 0.00    Total pack years: 2.50    Types: Cigarettes    Quit date: 12/2014    Years since quitting: 7.5   Smokeless tobacco: Never  Vaping Use   Vaping Use: Never used  Substance and Sexual Activity   Alcohol use: Not Currently    Comment: rare   Drug use: Not Currently    Comment: marinol that shows up at thc   Sexual activity: Not Currently    Partners: Male    Birth control/protection: Injection    Comment: Depo Injections - Next inj due in Sept 2023  Other Topics Concern   Not on file  Social History Narrative    Born and raised in [place], [place] by parents. Pt has one younger sister. 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 She stopped due to MS and is currently  on disability.        Married for less than 1 yr in 1999 that ended in divorce.      Caffeine Use: 1 20oz soda daily and 1 cup of coffee      Lives with daughter apartment first floor   Right handed    Social Determinants of Health   Financial Resource Strain: Not on file  Food Insecurity: Not on file  Transportation Needs: Not on file  Physical Activity: Not on file  Stress: Not on file  Social Connections: Not on file  Intimate Partner Violence: Not on file    SUBJECTIVE  Review of Systems Constitutional: Patient denies any unintentional weight loss or change in strength lntegumentary: Patient denies any rashes or  pruritus Eyes: Patient reports chronic dry eyes ENT: Patient denies dry mouth Cardiovascular: Patient denies chest pain or syncope Respiratory: Patient denies shortness of breath Gastrointestinal: Patient denies nausea, vomiting, constipation, or diarrhea Musculoskeletal: Patient denies muscle cramps or weakness Neurologic: Patient denies convulsions or seizures Psychiatric: Patient denies memory problems Allergic/Immunologic: Patient denies recent allergic reaction(s) Hematologic/Lymphatic: Patient denies bleeding tendencies Endocrine: Patient denies heat/cold intolerance  GU: As per HPI.  OBJECTIVE Vitals:   07/10/22 0926  BP: 125/87  Pulse: 85  Temp: 98.2 F (36.8 C)   There is no height or weight on file to calculate BMI.  Physical Examination  Constitutional: No obvious distress; patient is non-toxic appearing  Cardiovascular: No visible lower extremity edema.  Respiratory: The patient does not have audible wheezing/stridor; respirations do not appear labored  Gastrointestinal: Abdomen non-distended Musculoskeletal: Normal ROM of UEs  Skin: No obvious rashes/open sores  Neurologic: CN 2-12 grossly intact Psychiatric: Answered questions appropriately with normal affect  Hematologic/Lymphatic/Immunologic: No obvious bruises or sites of spontaneous bleeding  UA: positive for 6-10 WBC/hpf, 3-10 RBC/hpf, bacteria (moderate) PVR: 0 ml  ASSESSMENT SUI (stress urinary incontinence, female) - Plan: Urinalysis, Routine w reflex microscopic, BLADDER SCAN AMB NON-IMAGING, Ambulatory referral to Urology  Urge incontinence - Plan: mirabegron ER (MYRBETRIQ) 25 MG TB24 tablet  Urinary urgency - Plan: mirabegron ER (MYRBETRIQ) 25 MG TB24 tablet  Urinary frequency - Plan: mirabegron ER (MYRBETRIQ) 25 MG TB24 tablet  Kidney stones - Plan: DG Abd 1 View, Calculi, with Photograph (to Clinical Lab), US RENAL, tamsulosin (FLOMAX) 0.4 MG CAPS capsule  Dry eye  MS (multiple sclerosis)  (HCC)  1. Abnormal UA today. Will check urine culture and treat as indicated based on results. 2. Kidney stones: We agreed to obtain RUS & KUB to assess current stone burden. She plans to drop off stone for analysis. Prescribed Flomax for PRN use if/when stone symptoms occur; potential risks and benefits of that medication discussed.  3. Stress urinary incontinence. The etiology of this condition was explained in detail to include pelvic floor muscle relaxation and detachment of the urethra away from its connection to the pubic bone. Her risk profile was reviewed, including childbirth.  The management options were reviewed to include: No intervention, observation. Non-surgical options: Pelvic floor muscle rehabilitation Suppression strategies Weight loss Incontinence pessary Surgical consultation: Options may include a midurethral sling (with synthetic mesh implant or autologous fascial sling), a Burch urethropexy, or transurethral injection of a bulking agent. Detailed discussion was had regarding the potential risks of both procedures, including: mesh exposure, mesh erosion, failure to resolve SUI, ineffectiveness of these procedures for urge UI, dyspareunia, voiding dysfunction and/or retention.  She elected to proceed with surgical consultation.  4. OAB with urinary frequency, urgency, and  urge incontinence. We discussed the symptoms of overactive bladder (OAB), which include urinary urgency, frequency, nocturia, with or without urge incontinence.  While we may not know the exact etiology of OAB, several risk factors can be identified.  - We discussed this patient's neurogenic risk factors for OAB-type symptoms including multiple sclerosis and spinal stenosis.  - Likely exacerbated by caffeine intake.  We discussed the following management options in detail including potential benefits, risks, and side effects: Behavioral therapy: Modify fluid intake Decreasing bladder irritants (such  as caffeine, acidic foods, spicy foods, alcohol) Urge suppression strategies Bladder retraining / timed voiding Double voiding Medication(s): Not a safe candidate for anticholinergic medications due to her MS and pre-existing dry eyes / risk for side effects with this drug class. For beta-3 agonist medication, we discussed the risk for urinary retention and the potential side effect of elevated blood pressure specific to Myrbetriq (which is more likely to occur in individuals with uncontrolled hypertension).   She decided to proceed with Myrbetriq 25 mg daily and behavioral modifications (continue minimizing caffeine intake; working on timed voiding / bladder retraining).   Will plan for follow up here in 8 weeks for OAB f/u & review of imaging. Will follow up at [facility] for stress incontinence surgical consulation. Pt verbalized understanding and agreement. All questions were answered.  PLAN Advised the following: Urine culture. RUS & KUB prior to next visit. Stone analysis (she will drop off stone). Referred to [facility] for SUI surgical consult. Minimize caffeine intake. Work on timed voiding / bladder retraining. Start Myrbetriq 25 mg daily. Flomax PRN for stone symptoms. Return in about 8 weeks (around 09/04/2022) for UA, PVR, & f/u with Evette Georges NP.  Orders Placed This Encounter  Procedures   DG Abd 1 View    Standing Status:   Future    Standing Expiration Date:   07/10/2023    Order Specific Question:   Reason for Exam (SYMPTOM  OR DIAGNOSIS REQUIRED)    Answer:   kidney stone    Order Specific Question:   Is patient pregnant?    Answer:   No    Order Specific Question:   Preferred imaging location?    Answer:   [facility]   US RENAL    Standing Status:   Future    Standing Expiration Date:   07/10/2023    Order Specific Question:   Reason for Exam (SYMPTOM  OR DIAGNOSIS REQUIRED)    Answer:   kidney stone known or suspected    Order Specific  Question:   Preferred imaging location?    Answer:   [facility]   Urinalysis, Routine w reflex microscopic   Calculi, with Photograph (to Clinical Lab)   Ambulatory referral to Urology    Referral Priority:   Routine    Referral Type:   Consultation    Referral Reason:   Specialty Services Required    Requested Specialty:   Urology    Number of Visits Requested:   1   BLADDER SCAN AMB NON-IMAGING    It has been explained that the patient is to follow regularly with their PCP in addition to all other providers involved in their care and to follow instructions provided by these respective offices. Patient advised to contact urology clinic if any urologic-pertaining questions, concerns, new symptoms or problems arise in the interim period.  There are no Patient Instructions on file for this visit.  Electronically signed by:  Donnita Falls,  MSN, FNP-C, CUNP 07/10/2022 10:31 AM

## 2022-07-10 ENCOUNTER — Encounter: Payer: Self-pay | Admitting: Urology

## 2022-07-10 ENCOUNTER — Other Ambulatory Visit (HOSPITAL_COMMUNITY): Payer: Self-pay

## 2022-07-10 ENCOUNTER — Telehealth: Payer: Self-pay | Admitting: Pharmacy Technician

## 2022-07-10 ENCOUNTER — Other Ambulatory Visit: Payer: Self-pay | Admitting: Urology

## 2022-07-10 ENCOUNTER — Ambulatory Visit (INDEPENDENT_AMBULATORY_CARE_PROVIDER_SITE_OTHER): Payer: Medicare Other | Admitting: Urology

## 2022-07-10 VITALS — BP 125/87 | HR 85 | Temp 98.2°F

## 2022-07-10 DIAGNOSIS — N393 Stress incontinence (female) (male): Secondary | ICD-10-CM

## 2022-07-10 DIAGNOSIS — N200 Calculus of kidney: Secondary | ICD-10-CM

## 2022-07-10 DIAGNOSIS — N3946 Mixed incontinence: Secondary | ICD-10-CM | POA: Diagnosis not present

## 2022-07-10 DIAGNOSIS — G35D Multiple sclerosis, unspecified: Secondary | ICD-10-CM

## 2022-07-10 DIAGNOSIS — R350 Frequency of micturition: Secondary | ICD-10-CM | POA: Diagnosis not present

## 2022-07-10 DIAGNOSIS — G35 Multiple sclerosis: Secondary | ICD-10-CM

## 2022-07-10 DIAGNOSIS — R3915 Urgency of urination: Secondary | ICD-10-CM

## 2022-07-10 DIAGNOSIS — H04129 Dry eye syndrome of unspecified lacrimal gland: Secondary | ICD-10-CM

## 2022-07-10 DIAGNOSIS — N3941 Urge incontinence: Secondary | ICD-10-CM

## 2022-07-10 LAB — URINALYSIS, ROUTINE W REFLEX MICROSCOPIC
Bilirubin, UA: NEGATIVE
Glucose, UA: NEGATIVE
Ketones, UA: NEGATIVE
Nitrite, UA: NEGATIVE
Protein,UA: NEGATIVE
Specific Gravity, UA: 1.02 (ref 1.005–1.030)
Urobilinogen, Ur: 0.2 mg/dL (ref 0.2–1.0)
pH, UA: 5.5 (ref 5.0–7.5)

## 2022-07-10 LAB — MICROSCOPIC EXAMINATION

## 2022-07-10 LAB — BLADDER SCAN AMB NON-IMAGING: Scan Result: 0

## 2022-07-10 MED ORDER — TAMSULOSIN HCL 0.4 MG PO CAPS: ORAL_CAPSULE | ORAL | 0 refills | Status: DC

## 2022-07-10 MED ORDER — MIRABEGRON ER 25 MG PO TB24
25.0000 mg | ORAL_TABLET | Freq: Every day | ORAL | 11 refills | Status: AC
Start: 2022-07-10 — End: ?

## 2022-07-10 NOTE — Telephone Encounter (Signed)
Patient Advocate BotoxOne verification has been submitted. Benefit Verification:   [id number]  Pharmacy PA has been submitted for BOTOX 200U via CMM. INSURANCE: [employer] DATE SUBMITTED: 7.3.24 KEY: [id number] Status is pending

## 2022-07-10 NOTE — Addendum Note (Signed)
Addended by: Christoper Fabian R on: 07/10/2022 10:55 AM   Modules accepted: Orders

## 2022-07-12 LAB — URINE CULTURE

## 2022-07-15 ENCOUNTER — Other Ambulatory Visit: Payer: Self-pay

## 2022-07-15 ENCOUNTER — Other Ambulatory Visit (HOSPITAL_COMMUNITY): Payer: Self-pay

## 2022-07-15 MED ORDER — ONABOTULINUMTOXINA 200 UNITS IJ SOLR
INTRAMUSCULAR | 4 refills | Status: DC
  Filled 2022-07-15: qty 1, 84d supply, fill #0
  Filled 2022-07-15: qty 1, fill #0
  Filled 2022-10-10: qty 1, 84d supply, fill #1
  Filled 2023-03-06: qty 1, 84d supply, fill #2
  Filled 2023-07-07: qty 1, 84d supply, fill #3

## 2022-07-15 NOTE — Telephone Encounter (Signed)
Patient advised.

## 2022-07-15 NOTE — Telephone Encounter (Signed)
Pharmacy Patient Advocate Encounter- Botox BIV-Pharmacy Benefit:  PA was submitted to [employer] and has been approved through: 6.3.24 TO 7.3.25 Authorization# [id number]  Please send prescription to Specialty Pharmacy: [facility]: [phone]  Estimated Copay is: $0

## 2022-07-16 ENCOUNTER — Telehealth: Payer: Self-pay | Admitting: Neurology

## 2022-07-16 NOTE — Telephone Encounter (Signed)
Patient states that the paper work for [facility], the second page was not signed and dated, Patient states that is the only thing keeping her from getting the services/KB

## 2022-07-16 NOTE — Telephone Encounter (Signed)
Pt called we need the start date of her incontinence and we can refax her paperwork

## 2022-07-17 ENCOUNTER — Other Ambulatory Visit (INDEPENDENT_AMBULATORY_CARE_PROVIDER_SITE_OTHER): Payer: Medicare Other

## 2022-07-17 DIAGNOSIS — G35 Multiple sclerosis: Secondary | ICD-10-CM

## 2022-07-17 LAB — CBC
HCT: 44 % (ref 36.0–46.0)
Hemoglobin: 14.3 g/dL (ref 12.0–15.0)
MCHC: 32.4 g/dL (ref 30.0–36.0)
MCV: 88.1 fl (ref 78.0–100.0)
Platelets: 391 10*3/uL (ref 150.0–400.0)
RBC: 5 Mil/uL (ref 3.87–5.11)
RDW: 14.9 % (ref 11.5–15.5)
WBC: 12.2 10*3/uL — ABNORMAL HIGH (ref 4.0–10.5)

## 2022-07-17 LAB — COMPREHENSIVE METABOLIC PANEL
ALT: 11 U/L (ref 0–35)
AST: 12 U/L (ref 0–37)
Albumin: 4.2 g/dL (ref 3.5–5.2)
Alkaline Phosphatase: 92 U/L (ref 39–117)
BUN: 16 mg/dL (ref 6–23)
CO2: 27 mEq/L (ref 19–32)
Calcium: 10 mg/dL (ref 8.4–10.5)
Chloride: 102 mEq/L (ref 96–112)
Creatinine, Ser: 1 mg/dL (ref 0.40–1.20)
GFR: 68.6 mL/min (ref >60.00)
Glucose, Bld: 81 mg/dL (ref 70–99)
Potassium: 4.2 mEq/L (ref 3.5–5.1)
Sodium: 140 mEq/L (ref 135–145)
Total Bilirubin: 0.7 mg/dL (ref 0.2–1.2)
Total Protein: 6.8 g/dL (ref 6.0–8.3)

## 2022-07-17 LAB — VITAMIN D 25 HYDROXY (VIT D DEFICIENCY, FRACTURES): VITD: 21.74 ng/mL — ABNORMAL LOW (ref 30.00–100.00)

## 2022-07-17 NOTE — Telephone Encounter (Signed)
Pt called we need the start date of her incontinence and we can refax her paperwork

## 2022-07-17 NOTE — Telephone Encounter (Signed)
Pt called Emily [name] [birth year] 2008 was onset date

## 2022-07-18 LAB — IGG, IGA, IGM
IgG (Immunoglobin G), Serum: 1039 mg/dL (ref 600–1640)
IgM, Serum: 62 mg/dL (ref 50–300)
Immunoglobulin A: 307 mg/dL (ref 47–310)

## 2022-07-19 ENCOUNTER — Telehealth: Payer: Self-pay

## 2022-07-19 ENCOUNTER — Other Ambulatory Visit (HOSPITAL_COMMUNITY): Payer: Self-pay

## 2022-07-19 ENCOUNTER — Ambulatory Visit: Payer: Medicare Other | Admitting: Neurology

## 2022-07-19 MED ORDER — VITAMIN D (ERGOCALCIFEROL) 1.25 MG (50000 UNIT) PO CAPS
50000.0000 [IU] | ORAL_CAPSULE | ORAL | 0 refills | Status: AC
Start: 2022-07-19 — End: ?

## 2022-07-19 NOTE — Telephone Encounter (Signed)
Pt called informed that bloodwork was overall okay except for vitamin D level low. I sent in Rx for high dose vitamin D 50,000 units: 1 capsule every 7 days for 8 weeks. Then take daily calcium with vitamin D

## 2022-07-19 NOTE — Addendum Note (Signed)
Addended by: Van Clines on: 07/19/2022 12:33 PM   Modules accepted: Orders

## 2022-07-19 NOTE — Telephone Encounter (Signed)
-----   Message from Van Clines sent at 07/19/2022 12:33 PM EDT ----- Pls let her know bloodwork was overall okay except for vitamin D level low. I sent in Rx for high dose vitamin D 50,000 units: 1 capsule every 7 days for 8 weeks. Then take daily calcium with vitamin D. thanks

## 2022-07-22 ENCOUNTER — Other Ambulatory Visit: Payer: Self-pay

## 2022-07-24 ENCOUNTER — Ambulatory Visit (HOSPITAL_COMMUNITY): Admission: RE | Admit: 2022-07-24 | Payer: Medicare Other | Source: Ambulatory Visit

## 2022-07-26 ENCOUNTER — Ambulatory Visit (INDEPENDENT_AMBULATORY_CARE_PROVIDER_SITE_OTHER): Payer: Medicare Other | Admitting: Neurology

## 2022-07-26 DIAGNOSIS — G43709 Chronic migraine without aura, not intractable, without status migrainosus: Secondary | ICD-10-CM | POA: Diagnosis not present

## 2022-07-26 MED ORDER — ONABOTULINUMTOXINA 100 UNITS IJ SOLR
200.0000 [IU] | Freq: Once | INTRAMUSCULAR | Status: AC
Start: 2022-07-26 — End: 2022-07-26
  Administered 2022-07-26: 155 [IU] via INTRAMUSCULAR

## 2022-07-26 NOTE — Progress Notes (Signed)
[facility]  ° °Procedure Note Botox ° °Attending: Dr. Adam Jaffe ° °Preoperative Diagnosis(es): Chronic migraine ° °Consent obtained from: The patient °Benefits discussed included, but were not limited to decreased muscle tightness, increased joint range of motion, and decreased pain.  Risk discussed included, but were not limited pain and discomfort, bleeding, bruising, excessive weakness, venous thrombosis, muscle atrophy and dysphagia.  Anticipated outcomes of the procedure as well as he risks and benefits of the alternatives to the procedure, and the roles and tasks of the personnel to be involved, were discussed with the patient, and the patient consents to the procedure and agrees to proceed. A copy of the patient medication guide was given to the patient which explains the blackbox warning. ° °Patients identity and treatment sites confirmed Yes.  . ° °Details of Procedure: °Skin was cleaned with alcohol. Prior to injection, the needle plunger was aspirated to make sure the needle was not within a blood vessel.  There was no blood retrieved on aspiration.   ° °Following is a summary of the muscles injected  And the amount of Botulinum toxin used: ° °Dilution °200 units of Botox was reconstituted with 4 ml of preservative free normal saline. °Time of reconstitution: At the time of the office visit (<30 minutes prior to injection)  ° °Injections  °155 total units of Botox was injected with a 30 gauge needle. ° °Injection Sites: °L occipitalis: 15 units- 3 sites  °R occiptalis: 15 units- 3 sites ° °L upper trapezius: 15 units- 3 sites °R upper trapezius: 15 units- 3 sits          °L paraspinal: 10 units- 2 sites °R paraspinal: 10 units- 2 sites ° °Face °L frontalis(2 injection sites):10 units   °R frontalis(2 injection sites):10 units         °L corrugator: 5 units   °R corrugator: 5 units           °Procerus: 5 units   °L temporalis: 20 units °R temporalis: 20 units  ° °Agent:  °200 units of botulinum Type  A (Onobotulinum Toxin type A) was reconstituted with 4 ml of preservative free normal saline.  °Time of reconstitution: At the time of the office visit (<30 minutes prior to injection)  ° ° ° Total injected (Units):  155 ° Total wasted (Units):  45 ° °Patient tolerated procedure well without complications.   °Reinjection is anticipated in 3 months. ° ° °

## 2022-08-07 ENCOUNTER — Ambulatory Visit (HOSPITAL_COMMUNITY): Admission: RE | Admit: 2022-08-07 | Payer: Medicare Other | Source: Ambulatory Visit

## 2022-08-07 ENCOUNTER — Ambulatory Visit (HOSPITAL_COMMUNITY): Payer: Medicare Other | Attending: Urology

## 2022-08-17 ENCOUNTER — Other Ambulatory Visit: Payer: Self-pay | Admitting: Urology

## 2022-08-17 DIAGNOSIS — N200 Calculus of kidney: Secondary | ICD-10-CM

## 2022-08-28 ENCOUNTER — Telehealth: Payer: Self-pay | Admitting: Neurology

## 2022-08-28 NOTE — Telephone Encounter (Signed)
Patient called stating that the home health referral needs to be sent over for large pull ups. Home care delivered is the company [phone]

## 2022-08-29 ENCOUNTER — Telehealth: Payer: Self-pay | Admitting: Neurology

## 2022-08-29 NOTE — Telephone Encounter (Signed)
Paperwork faxed to new fax number

## 2022-08-29 NOTE — Telephone Encounter (Signed)
Paperwork has been faxed in for home care delivery

## 2022-08-29 NOTE — Telephone Encounter (Signed)
Home care called and stated they never received paper work that was fax over. Stated that the wrong number was given. Fax number - [phone] Alt. Fax number - [phone]

## 2022-09-02 ENCOUNTER — Ambulatory Visit: Payer: Medicare Other | Admitting: Urology

## 2022-09-24 DIAGNOSIS — N3281 Overactive bladder: Secondary | ICD-10-CM | POA: Insufficient documentation

## 2022-09-24 DIAGNOSIS — N3946 Mixed incontinence: Secondary | ICD-10-CM | POA: Insufficient documentation

## 2022-09-24 NOTE — Progress Notes (Deleted)
Name: Emily [name] DOB: [birth year] MRN: [id number]  History of Present Illness: Emily [name] is a 42 y.o. female who presents today for follow up visit at [facility] [place]. - GU history: 1. Kidney stones. She reports passing stones at least twice per year typically. 2. Stress incontinence.  3. OAB with urinary frequency, nocturia x2, urgency, and urge incontinence.  - UUI predominant over SUI.  - Possible neurogenic bladder secondary to her multiple sclerosis and nicotine use.  At last visit on 07/10/2022: - Reported voiding "every hour" during the day & 2x/night. - Reported caffeine intake (20 oz of soda per day). - Denies history of obstructive sleep apnea; states she had a negative sleep study >7 years ago. - The plan was:  > For stones: 1. RUS & KUB for stone surveillance.  2. Stone analysis (she will drop off stone). 3. Flomax PRN for stone symptoms. > For SUI:  4. Referred to [facility] for SUI surgical consult per pt request. > For OAB / UUI:  5. Minimize caffeine intake. 6. Work on timed voiding / bladder retraining. 7. Start Myrbetriq 25 mg daily. 8. Return in about 8 weeks (around 09/04/2022) for UA, PVR, & f/u with Evette Georges NP.  Since last visit: - 09/16/2022: Seen at [facility] for SUI surgical consult. ***  Today: - RUS today: Awaiting radiology read; *** - KUB today: Awaiting radiology read; ***  She reports ***  She {Actions; denies-reports:120008} symptomatic improvement since starting Myrbetriq (Mirabegron) 25 mg daily.  She reports {Blank multiple:19197::"improved","persistent / unchanged"} urinary ***frequency, ***nocturia, ***urgency, and ***urge incontinence. Voiding ***x/day and ***x/night on average. Leaking ***x/day on average; using *** ***pads / ***diapers per day on average.  She {Actions; denies-reports:120008} dysuria, gross hematuria, straining to void, or sensations of incomplete emptying.   Fall Screening: Do  you usually have a device to assist in your mobility? {yes/no:20286} ***cane / ***walker / ***wheelchair   Medications: Current Outpatient Medications  Medication Sig Dispense Refill   albuterol (PROVENTIL HFA;VENTOLIN HFA) 108 (90 Base) MCG/ACT inhaler Inhale 2 puffs into the lungs every 6 (six) hours as needed for wheezing or shortness of breath. 1 Inhaler 0   albuterol (PROVENTIL) (2.5 MG/3ML) 0.083% nebulizer solution Take 3 mLs (2.5 mg total) by nebulization every 6 (six) hours as needed for wheezing or shortness of breath. 75 mL 12   amLODipine (NORVASC) 5 MG tablet Take 5 mg by mouth daily.   2   amphetamine-dextroamphetamine (ADDERALL XR) 20 MG 24 hr capsule Take 20 mg by mouth daily.     benzonatate (TESSALON) 100 MG capsule Take 1 capsule (100 mg total) by mouth every 8 (eight) hours. 21 capsule 0   botulinum toxin Type A (BOTOX) 200 units injection Inject 155 units IM into multiple site in the face,neck and head once every 90 days 1 each 4   cariprazine (VRAYLAR) capsule Take 3 mg by mouth at bedtime.     Diethylpropion HCl CR 75 MG TB24 Take 75 mg by mouth daily.     divalproex (DEPAKOTE) 500 MG DR tablet Take 1 tablet (500 mg total) by mouth at bedtime. 90 tablet 3   dronabinol (MARINOL) 2.5 MG capsule Take 1 capsule (2.5 mg total) by mouth 2 (two) times daily before lunch and supper. TAKE 1 CAPSULE BY MOUTH 3 TIMES DAILY WITH MEALS. 60 capsule 3   EPINEPHrine 0.3 mg/0.3 mL IJ SOAJ injection Inject 0.3 mg into the muscle as needed for anaphylaxis.  famotidine (PEPCID) 40 MG tablet Take 1 tablet (40 mg total) by mouth at bedtime. (Patient taking differently: Take 40 mg by mouth daily as needed (heartburn/indigestion.).) 90 tablet 3   guanFACINE (TENEX) 1 MG tablet Take 1 mg by mouth at bedtime.     loratadine (CLARITIN) 10 MG tablet Take 10 mg by mouth daily as needed for allergies.     medroxyPROGESTERone (DEPO-PROVERA) 150 MG/ML injection Inject 150 mg into the muscle every 3  (three) months.     mirabegron ER (MYRBETRIQ) 25 MG TB24 tablet Take 1 tablet (25 mg total) by mouth daily. 30 tablet 11   Naphazoline-Glycerin (CLEAR EYES REDNESS RELIEF OP) Place 1 drop into both eyes in the morning, at noon, and at bedtime.     ocrelizumab 600 mg in sodium chloride 0.9 % 500 mL Inject 600 mg into the vein every 6 (six) months.     omeprazole (PRILOSEC) 20 MG capsule TAKE 1 CAPSULE BY MOUTH TWICE DAILY 60 capsule 5   omeprazole (PRILOSEC) 20 MG capsule TAKE 1 CAPSULE BY MOUTH TWICE DAILY 60 capsule 5   ondansetron (ZOFRAN) 4 MG tablet Take 1 tablet (4 mg total) by mouth every 8 (eight) hours as needed for nausea or vomiting. 30 tablet 1   oxyCODONE (OXY IR/ROXICODONE) 5 MG immediate release tablet Take 5 mg by mouth in the morning and at bedtime.     pantoprazole (PROTONIX) 40 MG tablet Take 1 tablet (40 mg total) by mouth daily. 30 tablet 11   potassium chloride SA (KLOR-CON M) 20 MEQ tablet Take 2 tablets (40 mEq total) by mouth daily for 4 days. 8 tablet 0   pregabalin (LYRICA) 150 MG capsule Take 150 mg by mouth daily with lunch.     pregabalin (LYRICA) 225 MG capsule Take 1 capsule (225 mg total) by mouth 2 (two) times daily. 60 capsule 2   rosuvastatin (CRESTOR) 20 MG tablet Take 20 mg by mouth daily.     tamsulosin (FLOMAX) 0.4 MG CAPS capsule TAKE 1 CAPSULE BY MOUTH AS NEEDED WHEN STONE SYMPTOMS OCCUR 90 capsule 0   topiramate (TOPAMAX) 100 MG tablet Take 1 tablet three times a day 270 tablet 3   traZODone (DESYREL) 50 MG tablet Take 100 mg by mouth at bedtime.     Vitamin D, Ergocalciferol, (DRISDOL) 1.25 MG (50000 UNIT) CAPS capsule Take 1 capsule (50,000 Units total) by mouth every 7 (seven) days. For 8 weeks 8 capsule 0   No current facility-administered medications for this visit.   Facility-Administered Medications Ordered in Other Visits  Medication Dose Route Frequency Provider Last Rate Last Admin   metoCLOPramide (REGLAN) injection 10 mg  10 mg Intravenous  Once Bethann Berkshire, MD        Allergies: Allergies  Allergen Reactions   Baclofen Hives and Shortness Of Breath   Cymbalta [Duloxetine Hcl] Shortness Of Breath and Rash   Gabapentin Shortness Of Breath and Rash   Monosodium Glutamate Anaphylaxis   Other Shortness Of Breath, Rash and Other (See Comments)    MSG, beans (vomiting) MSG causes hives, itching, throat swelling   Acetaminophen Nausea And Vomiting    Projectile vomiting   Alprazolam Other (See Comments)    Lethargy Pt states dosage was just too high; does well with other benzos   Amitriptyline Hypertension   Magnesium Salicylate Hives and Itching   Modafinil Other (See Comments)    Seizures    Rizatriptan Nausea And Vomiting and Other (See Comments)    GI  upset, Projectile vomiting    Tizanidine Hives    GI Upset (intolerance)   Adhesive [Tape] Other (See Comments)    Skin irritation Paper tape is ok   Hydrocodone-Acetaminophen Hives and Nausea And Vomiting    Projectile vomiting   Ketorolac Tromethamine Nausea Only    Toradol    Lamotrigine Rash   Nsaids Nausea And Vomiting    Irritate stomach    Tramadol Nausea And Vomiting    GI Upset (intolerance)    Past Medical History:  Diagnosis Date   ADHD    Anxiety    Chronic back pain    Conversion disorder    DDD (degenerative disc disease), cervical    Depression    External hemorrhoid    Fatty liver    Fibromyalgia    GERD (gastroesophageal reflux disease)    History of asthma    childhood   History of bowel resection 2009   for perforated bowel--- colostomy bag for 3 motnhs   History of kidney stones    passed stones   HTN (hypertension) 02/27/2015   JC virus antibody positive    Migraines    MS (multiple sclerosis) (HCC) 06/2006   Neuropathy    OA (osteoarthritis)    Ovarian cyst    Panic attack    Pseudoseizures    last seizure was in 08/2018    PTSD (post-traumatic stress disorder)    Seasonal allergies    Urinary urgency    Vertigo     Wears contact lenses    Wears glasses    Past Surgical History:  Procedure Laterality Date   ABDOMINAL SURGERY     ABDOMINAL WOUND EXPLORATION   03/19/2019   Abdominal Wound Exploration with Explantation of Mesh   APPENDECTOMY     BOWEL RESECTION  01/2007   with colostomy   BOWEL RESECTION N/A 06/14/2019   Procedure: SMALL BOWEL RESECTION;  Surgeon: Axel Filler, MD;  Location: [facility] OR;  Service: General;  Laterality: N/A;   CESAREAN SECTION N/A 07/12/2015   Procedure: CESAREAN SECTION;  Surgeon: Lesly Dukes, MD;  Location: [facility] SU[facility];  Service: Obstetrics;  Laterality: N/A;   COLON SURGERY     part of colon and intestines removed; colostomy 2008   COLONOSCOPY WITH PROPOFOL N/A 01/29/2017   Procedure: COLONOSCOPY WITH PROPOFOL;  Surgeon: Andria Meuse, MD;  Location: [facility];  Service: General;  Laterality: N/A;   COLOSTOMY CLOSURE  04/2007   ESOPHAGEAL DILATION  08/22/2021   Procedure: ESOPHAGEAL DILATION;  Surgeon: Corbin Ade, MD;  Location: [facility];  Service: Endoscopy;;   ESOPHAGOGASTRODUODENOSCOPY (EGD) WITH PROPOFOL N/A 08/22/2021   Procedure: ESOPHAGOGASTRODUODENOSCOPY (EGD) WITH PROPOFOL;  Surgeon: Corbin Ade, MD;  Location: [facility];  Service: Endoscopy;  Laterality: N/A;  9:30am, asa 3   EVALUATION UNDER ANESTHESIA WITH HEMORRHOIDECTOMY N/A 06/11/2018   Procedure: ANORECTAL EXAM UNDER ANESTHESIA WITH HEMORRHOIDECTOMY;  Surgeon: Andria Meuse, MD;  Location: [facility];  Service: General;  Laterality: N/A;   EXCISIONAL HEMORRHOIDECTOMY     EXTREMITY CYST EXCISION  1994   right leg   INCISIONAL HERNIA REPAIR N/A 02/16/2016   Procedure: HERNIA REPAIR INCISIONAL;  Surgeon: Claud Kelp, MD;  Location: [facility] OR[facility];  Service: General;  Laterality: N/A;   INSERTION OF MESH  02/16/2016   Procedure: INSERTION OF MESH;  Surgeon: Claud Kelp, MD;  Location: [facility];  Service: General;;   LAPAROSCOPY N/A 06/14/2019    Procedure: ATTEMPTED DIAGNOSTIC LAPAROSCOPY;  Surgeon: Axel Filler, MD;  Location[facility] OR;  Service: General;  Laterality: N/A;   LAPAROTOMY  03/19/2019   Procedure: Abdominal Wound Exploration with Explantation of Mesh;  Surgeon: Axel Filler, MD;  Location: [facility] OR;  Service: General;;   LAPAROTOMY N/A 06/14/2019   Procedure: EXPLORATORY LAPAROTOMY;  Surgeon: Axel Filler, MD;  Location: [facility] OR;  Service: General;  Laterality: N/A;   LYSIS OF ADHESION N/A 06/14/2019   Procedure: LYSIS OF ADHESIONS;  Surgeon: Axel Filler, MD;  Location: [facility] OR;  Service: General;  Laterality: N/A;   RADIOLOGY WITH ANESTHESIA N/A 03/06/2017   Procedure: MRI WITH ANESTHESIA OF CERVICAL SPINE WITHOUT CONTRAST, MRI OF LUMBAR SPINE WITHOUT CONTRAST;  Surgeon: Radiologist, Medication, MD;  Location: [facility] OR;  Service: Radiology;  Laterality: N/A;   RADIOLOGY WITH ANESTHESIA N/A 09/15/2018   Procedure: MRI OF BRAIN WITH AND WITHOUT CONTRAST;  Surgeon: Radiologist, Medication, MD;  Location: [facility] OR;  Service: Radiology;  Laterality: N/A;   RADIOLOGY WITH ANESTHESIA N/A 10/13/2018   Procedure: MRI OF BRAIN WITH AND WITHOUT CONTRAST;  Surgeon: Radiologist, Medication, MD;  Location: [facility] OR;  Service: Radiology;  Laterality: N/A;   RADIOLOGY WITH ANESTHESIA N/A 10/26/2018   Procedure: MRI UNDER ANESTHESIA; HEAD, CERVICAL AND THORASCIC SPINE;  Surgeon: Radiologist, Medication, MD;  Location: [facility] OR;  Service: Radiology;  Laterality: N/A;   RADIOLOGY WITH ANESTHESIA N/A 06/08/2020   Procedure: MRI WITH ANESTHESIA BRAIN WITH AND WITHOUT CONTRAST.,  CERVICAL BRAIN WITH AND WITHOUT CONTRAST, THORACIC SPINE WITH AND WITHOUT;  Surgeon: Radiologist, Medication, MD;  Location: [facility] OR;  Service: Radiology;  Laterality: N/A;   RADIOLOGY WITH ANESTHESIA N/A 07/03/2021   Procedure: MRI BRAIN WITH OR WITHOUT CONTRAST WITH ANESTHESIA;  Surgeon: Radiologist, Medication, MD;  Location: [facility] OR;  Service: Radiology;  Laterality: N/A;    SCAR REVISION  01/21/2011   Procedure: SCAR REVISION;  Surgeon: Rosalio Macadamia;  Location: [facility];  Service: Plastics;  Laterality: N/A;  exploration of scar of abdomen and repair of defect   TRANSRECTAL DRAINAGE OF PELVIC ABSCESS     Family History  Problem Relation Age of Onset   Diabetes Mother    Hypertension Mother    Diabetes Father    Hypertension Father    Arthritis Father    Cancer Maternal Grandmother    Cancer Maternal Grandfather    Alcohol abuse Neg Hx    Anxiety disorder Neg Hx    Bipolar disorder Neg Hx    Drug abuse Neg Hx    Depression Neg Hx    Colon cancer Neg Hx    Social History   Socioeconomic History   Marital status: Single    Spouse name: Not on file   Number of children: 1   Years of education: College   Highest education level: Not on file  Occupational History    Employer: OTHER    Comment: disability  Tobacco Use   Smoking status: Former    Current packs/day: 0.00    Average packs/day: 0.3 packs/day for 10.0 years (2.5 ttl pk-yrs)    Types: Cigarettes    Start date: 12/2004    Quit date: 12/2014    Years since quitting: 7.8   Smokeless tobacco: Never  Vaping Use   Vaping status: Never Used  Substance and Sexual Activity   Alcohol use: Not Currently    Comment: rare   Drug use: Not Currently    Comment: marinol that shows up at thc   Sexual activity: Not  Currently    Partners: Male    Birth control/protection: Injection    Comment: Depo Injections - Next inj due in Sept 2023  Other Topics Concern   Not on file  Social History Narrative    Born and raised in [place], [place] by parents. Pt has one younger sister. 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 She stopped due to MS and is currently on disability.        Married for less than 1 yr in 1999 that ended in divorce.      Caffeine Use: 1 20oz soda daily and 1 cup of coffee      Lives with daughter apartment first floor   Right  handed    Social Determinants of Health   Financial Resource Strain: Low Risk  (05/24/2021)   Received from [facility], [facility]   Overall Financial Resource Strain (CARDIA)    Difficulty of Paying Living Expenses: Not hard at all  Food Insecurity: No Food Insecurity (05/24/2021)   Received from [facility], [facility]   Hunger Vital Sign    Worried About Running Out of Food in the Last Year: Never true    Ran Out of Food in the Last Year: Never true  Transportation Needs: No Transportation Needs (05/24/2021)   Received from [facility], [facility]   [facility] - Transportation    Lack of Transportation (Medical): No    Lack of Transportation (Non-Medical): No  Physical Activity: Not on file  Stress: No Stress Concern Present (05/24/2021)   Received from [facility], [facility]   [facility] - Occupational Stress Questionnaire    Feeling of Stress : Only a little  Social Connections: Unknown (05/07/2021)   Received from [facility], [facility]   Social Network    Social Network: Not on file  Intimate Partner Violence: Not At Risk (10/10/2021)   Received from [facility], [facility]   Humiliation, Afraid, Rape, and Kick questionnaire    Fear of Current or Ex-Partner: No    Emotionally Abused: No    Physically Abused: No    Sexually Abused: No    Review of Systems Constitutional: Patient ***denies any unintentional weight loss or change in strength lntegumentary: Patient ***denies any rashes or pruritus Eyes: Patient denies ***dry eyes ENT: Patient ***denies dry mouth Cardiovascular: Patient ***denies chest pain or syncope Respiratory: Patient ***denies shortness of breath Gastrointestinal: Patient ***denies nausea, vomiting, constipation, or diarrhea Musculoskeletal: Patient ***denies muscle cramps or weakness Neurologic: Patient ***denies convulsions or seizures Psychiatric: Patient ***denies memory  problems Allergic/Immunologic: Patient ***denies recent allergic reaction(s) Hematologic/Lymphatic: Patient denies bleeding tendencies Endocrine: Patient ***denies heat/cold intolerance  GU: As per HPI.  OBJECTIVE There were no vitals filed for this visit. There is no height or weight on file to calculate BMI.  Physical Examination Constitutional: ***No obvious distress; patient is ***non-toxic appearing  Cardiovascular: ***No visible lower extremity edema.  Respiratory: The patient does ***not have audible wheezing/stridor; respirations do ***not appear labored  Gastrointestinal: Abdomen ***non-distended Musculoskeletal: ***Normal ROM of UEs  Skin: ***No obvious rashes/open sores  Neurologic: CN 2-12 grossly ***intact Psychiatric: Answered questions ***appropriately with ***normal affect  Hematologic/Lymphatic/Immunologic: ***No obvious bruises or sites of spontaneous bleeding  UA: ***negative / *** WBC/hpf, *** RBC/hpf, bacteria (***) PVR: *** ml  ASSESSMENT No diagnosis found. ***  Will plan for follow up in *** months / ***1 year or sooner  if needed. Pt verbalized understanding and agreement. All questions were answered.  PLAN Advised the following: 1. *** 2. ***No follow-ups on file.  No orders of the defined types were placed in this encounter.   It has been explained that the patient is to follow regularly with their PCP in addition to all other providers involved in their care and to follow instructions provided by these respective offices. Patient advised to contact urology clinic if any urologic-pertaining questions, concerns, new symptoms or problems arise in the interim period.  There are no Patient Instructions on file for this visit.  Electronically signed by:  Donnita Falls, FNP   09/24/22    10:40 AM

## 2022-09-25 ENCOUNTER — Ambulatory Visit: Payer: Medicare Other | Admitting: Urology

## 2022-09-27 ENCOUNTER — Ambulatory Visit (HOSPITAL_COMMUNITY): Payer: Medicare Other

## 2022-10-10 ENCOUNTER — Other Ambulatory Visit: Payer: Self-pay | Admitting: Pharmacy Technician

## 2022-10-10 ENCOUNTER — Other Ambulatory Visit: Payer: Self-pay

## 2022-10-10 ENCOUNTER — Other Ambulatory Visit (HOSPITAL_COMMUNITY): Payer: Self-pay

## 2022-10-10 NOTE — Progress Notes (Signed)
Specialty Pharmacy Refill Coordination Note  Emily [name] is a 42 y.o. female contacted today regarding refills of specialty medication(s) Onabotulinumtoxina   Patient requested Courier to Provider Office   Delivery date: 10/21/22   Verified address: [facility], [address]   Medication will be filled on 10/18/22.

## 2022-10-21 ENCOUNTER — Other Ambulatory Visit: Payer: Self-pay

## 2022-10-25 ENCOUNTER — Ambulatory Visit: Payer: Medicare Other | Admitting: Neurology

## 2022-11-13 ENCOUNTER — Other Ambulatory Visit: Payer: Self-pay

## 2022-11-25 ENCOUNTER — Other Ambulatory Visit: Payer: Self-pay | Admitting: Urology

## 2022-11-25 DIAGNOSIS — N200 Calculus of kidney: Secondary | ICD-10-CM

## 2022-12-10 ENCOUNTER — Telehealth: Payer: Self-pay | Admitting: Neurology

## 2022-12-10 NOTE — Telephone Encounter (Signed)
Pt is going to need a VV before we do the H&P because its been a while since she has been seen,

## 2022-12-10 NOTE — Telephone Encounter (Signed)
Pt called in stating she is supposed to be getting an MRI on 12/19/22. The office was sent a form to be filled out, so she can get the MRI done under anesthesia. [facility] hasn't received the form yet. The pt is aware Dr. Karel Jarvis is not back in the office until 12/16/22. She wants to make sure we have the paperwork so it's ready to go once Dr. Karel Jarvis gets back.  The patient is anxious because she doesn't want to have to reschedule since she has already gotten child care set up for the appointment.

## 2022-12-10 NOTE — Telephone Encounter (Signed)
Ok for VV on 12/9 Monday at 12nn, thanks

## 2022-12-11 NOTE — Telephone Encounter (Signed)
Patient was offered a VV for 12-16-22 at 12:00 pm and patient states that she was not able to do that because she has another appt in [place] at 8:45 and will be in the transportation bus and would not be able to set up phone.  So she states that she will have to cancel the MRI. She is schedule for Botox with Jaffe on 12-20-22 and is still schedule for [facility] on 01-13-23. Patient is aware of these appt.

## 2022-12-11 NOTE — Telephone Encounter (Signed)
Patient called and to let us know that she spoke with the MRI place and they told her that her PCP can fill out the form. So she will have them fill it out so that she can keep the MRI on 12-19-22.

## 2022-12-19 ENCOUNTER — Ambulatory Visit (HOSPITAL_COMMUNITY): Admission: RE | Admit: 2022-12-19 | Payer: Medicare Other | Source: Home / Self Care

## 2022-12-19 ENCOUNTER — Encounter (HOSPITAL_COMMUNITY): Admission: RE | Payer: Self-pay | Source: Home / Self Care

## 2022-12-19 ENCOUNTER — Ambulatory Visit (HOSPITAL_COMMUNITY): Payer: Medicare Other

## 2022-12-19 ENCOUNTER — Other Ambulatory Visit (HOSPITAL_COMMUNITY): Payer: Medicare Other

## 2022-12-19 SURGERY — MRI WITH ANESTHESIA
Anesthesia: General

## 2022-12-20 ENCOUNTER — Ambulatory Visit (INDEPENDENT_AMBULATORY_CARE_PROVIDER_SITE_OTHER): Payer: Medicare Other | Admitting: Neurology

## 2022-12-20 DIAGNOSIS — G43709 Chronic migraine without aura, not intractable, without status migrainosus: Secondary | ICD-10-CM | POA: Diagnosis not present

## 2022-12-20 MED ORDER — ONABOTULINUMTOXINA 100 UNITS IJ SOLR
200.0000 [IU] | Freq: Once | INTRAMUSCULAR | Status: AC
Start: 2022-12-20 — End: ?

## 2022-12-20 NOTE — Progress Notes (Signed)
[facility]  ° °Procedure Note Botox ° °Attending: Dr. Adam Jaffe ° °Preoperative Diagnosis(es): Chronic migraine ° °Consent obtained from: The patient °Benefits discussed included, but were not limited to decreased muscle tightness, increased joint range of motion, and decreased pain.  Risk discussed included, but were not limited pain and discomfort, bleeding, bruising, excessive weakness, venous thrombosis, muscle atrophy and dysphagia.  Anticipated outcomes of the procedure as well as he risks and benefits of the alternatives to the procedure, and the roles and tasks of the personnel to be involved, were discussed with the patient, and the patient consents to the procedure and agrees to proceed. A copy of the patient medication guide was given to the patient which explains the blackbox warning. ° °Patients identity and treatment sites confirmed Yes.  . ° °Details of Procedure: °Skin was cleaned with alcohol. Prior to injection, the needle plunger was aspirated to make sure the needle was not within a blood vessel.  There was no blood retrieved on aspiration.   ° °Following is a summary of the muscles injected  And the amount of Botulinum toxin used: ° °Dilution °200 units of Botox was reconstituted with 4 ml of preservative free normal saline. °Time of reconstitution: At the time of the office visit (<30 minutes prior to injection)  ° °Injections  °155 total units of Botox was injected with a 30 gauge needle. ° °Injection Sites: °L occipitalis: 15 units- 3 sites  °R occiptalis: 15 units- 3 sites ° °L upper trapezius: 15 units- 3 sites °R upper trapezius: 15 units- 3 sits          °L paraspinal: 10 units- 2 sites °R paraspinal: 10 units- 2 sites ° °Face °L frontalis(2 injection sites):10 units   °R frontalis(2 injection sites):10 units         °L corrugator: 5 units   °R corrugator: 5 units           °Procerus: 5 units   °L temporalis: 20 units °R temporalis: 20 units  ° °Agent:  °200 units of botulinum Type  A (Onobotulinum Toxin type A) was reconstituted with 4 ml of preservative free normal saline.  °Time of reconstitution: At the time of the office visit (<30 minutes prior to injection)  ° ° ° Total injected (Units):  155 ° Total wasted (Units):  45 ° °Patient tolerated procedure well without complications.   °Reinjection is anticipated in 3 months. ° ° °

## 2023-01-13 ENCOUNTER — Ambulatory Visit: Payer: Medicare Other | Admitting: Neurology

## 2023-01-15 ENCOUNTER — Other Ambulatory Visit: Payer: Self-pay

## 2023-01-17 ENCOUNTER — Other Ambulatory Visit: Payer: Self-pay

## 2023-01-20 ENCOUNTER — Other Ambulatory Visit: Payer: Self-pay

## 2023-01-24 ENCOUNTER — Ambulatory Visit: Payer: Medicare Other | Admitting: Neurology

## 2023-02-28 ENCOUNTER — Other Ambulatory Visit: Payer: Self-pay

## 2023-02-28 DIAGNOSIS — G35 Multiple sclerosis: Secondary | ICD-10-CM

## 2023-02-28 DIAGNOSIS — G40909 Epilepsy, unspecified, not intractable, without status epilepticus: Secondary | ICD-10-CM

## 2023-03-05 ENCOUNTER — Telehealth: Payer: Self-pay

## 2023-03-05 NOTE — Telephone Encounter (Signed)
 Patient called and informed me that she is scheduled to have her MRI's done on 07/17/23 and she was informed by radiology that she needs to have a visit with Dr. Karel Jarvis before 07/17/23. Patient is worried and doesn't want to miss the 07/17/23 appointment because she feels "a spot has popped up."   Patient is aware that we are short staffed and that we will contact her as soon as we hear something,

## 2023-03-06 ENCOUNTER — Other Ambulatory Visit: Payer: Self-pay

## 2023-03-06 ENCOUNTER — Telehealth: Payer: Self-pay | Admitting: Neurology

## 2023-03-06 ENCOUNTER — Other Ambulatory Visit (HOSPITAL_COMMUNITY): Payer: Self-pay

## 2023-03-06 NOTE — Telephone Encounter (Signed)
 Left message with the after hour service on 03-06-23 at 1:18 pm   Caller states that she has leg apin and MRI is on July 10 she take ocrevus for MS has pain in both legs  please call

## 2023-03-06 NOTE — Progress Notes (Signed)
 Specialty Pharmacy Refill Coordination Note  Emily [name] is a 43 y.o. female contacted today regarding refills of specialty medication(s) OnabotulinumtoxinA (BOTOX)   Patient requested Courier to Provider Office   Delivery date: 03/13/23   Verified address: [facility] [place], [address]   [place] [address]   Medication will be filled on 03/12/23.

## 2023-03-06 NOTE — Telephone Encounter (Signed)
 Pt. having issues with legs after Ocrivus infusion on Monday with throbbing pain, would like appt ASAP but no time avail? *she has medicaid transportation need 5 days in advance.

## 2023-03-06 NOTE — Telephone Encounter (Signed)
 Pt

## 2023-03-07 NOTE — Telephone Encounter (Signed)
 Pls let her know that it is unlikely from Ocrevus, which she has been on for a while now. Also, MS flares do not typically cause pain. Most commonly the pain is probably from her back. Has she had any falls or done any heavy lifting?

## 2023-03-07 NOTE — Telephone Encounter (Signed)
 Pt called she stated that she is having leg pain she wants to know if its from he leg pain? Because it started after her ocrevus infusion she said that its a trobbing aching feeling its it from the ocrevus or a new symptom she is having form the MS

## 2023-03-07 NOTE — Telephone Encounter (Signed)
 Pt called an informed that  it is unlikely from Ocrevus, which she has been on for a while now. Also, MS flares do not typically cause pain. Most commonly the pain is probably from her back. Has she had any falls or done any heavy lifting? She stated that she has not had any falls she can't do any heavy lifting, she is going to reach out to her back Dr,

## 2023-03-10 ENCOUNTER — Other Ambulatory Visit (HOSPITAL_COMMUNITY): Payer: Self-pay

## 2023-03-12 NOTE — Telephone Encounter (Signed)
 Ok for work in f/u slot the week prior to 7/10, thanks

## 2023-03-21 ENCOUNTER — Ambulatory Visit: Payer: Medicare Other | Admitting: Neurology

## 2023-04-18 ENCOUNTER — Ambulatory Visit (INDEPENDENT_AMBULATORY_CARE_PROVIDER_SITE_OTHER): Payer: Medicare Other | Admitting: Neurology

## 2023-04-18 DIAGNOSIS — G43709 Chronic migraine without aura, not intractable, without status migrainosus: Secondary | ICD-10-CM | POA: Diagnosis not present

## 2023-04-18 MED ORDER — ONABOTULINUMTOXINA 200 UNITS IJ SOLR
200.0000 [IU] | Freq: Once | INTRAMUSCULAR | Status: AC
  Administered 2023-04-18: 155 [IU] via INTRAMUSCULAR

## 2023-04-18 NOTE — Progress Notes (Signed)
 [facility]   Procedure Note Botox  Attending: Dr. Shon Millet  Preoperative Diagnosis(es): Chronic migraine  Consent obtained from: The patient Benefits discussed included, but were not limited to decreased muscle tightness, increased joint range of motion, and decreased pain.  Risk discussed included, but were not limited pain and discomfort, bleeding, bruising, excessive weakness, venous thrombosis, muscle atrophy and dysphagia.  Anticipated outcomes of the procedure as well as he risks and benefits of the alternatives to the procedure, and the roles and tasks of the personnel to be involved, were discussed with the patient, and the patient consents to the procedure and agrees to proceed. A copy of the patient medication guide was given to the patient which explains the blackbox warning.  Patients identity and treatment sites confirmed Yes.  .  Details of Procedure: Skin was cleaned with alcohol. Prior to injection, the needle plunger was aspirated to make sure the needle was not within a blood vessel.  There was no blood retrieved on aspiration.    Following is a summary of the muscles injected  And the amount of Botulinum toxin used:  Dilution 200 units of Botox was reconstituted with 4 ml of preservative free normal saline. Time of reconstitution: At the time of the office visit (<30 minutes prior to injection)   Injections  155 total units of Botox was injected with a 30 gauge needle.  Injection Sites: L occipitalis: 15 units- 3 sites  R occiptalis: 15 units- 3 sites  L upper trapezius: 15 units- 3 sites R upper trapezius: 15 units- 3 sits          L paraspinal: 10 units- 2 sites R paraspinal: 10 units- 2 sites  Face L frontalis(2 injection sites):10 units   R frontalis(2 injection sites):10 units         L corrugator: 5 units   R corrugator: 5 units           Procerus: 5 units   L temporalis: 20 units R temporalis: 20 units   Agent:  200 units of botulinum Type  A (Onobotulinum Toxin type A) was reconstituted with 4 ml of preservative free normal saline.  Time of reconstitution: At the time of the office visit (<30 minutes prior to injection)     Total injected (Units):  155  Total wasted (Units):  45  Patient tolerated procedure well without complications.   Reinjection is anticipated in 3 months.

## 2023-05-28 ENCOUNTER — Other Ambulatory Visit (HOSPITAL_COMMUNITY): Payer: Self-pay

## 2023-06-05 ENCOUNTER — Telehealth: Payer: Self-pay | Admitting: Neurology

## 2023-06-05 NOTE — Telephone Encounter (Signed)
 Pt called in stating [facility] is missing orders for her incontinence wipes. They faxed us  something to send back.

## 2023-06-11 ENCOUNTER — Ambulatory Visit: Payer: Medicare Other | Admitting: Neurology

## 2023-06-11 ENCOUNTER — Encounter: Payer: Self-pay | Admitting: Neurology

## 2023-06-11 NOTE — Telephone Encounter (Signed)
Letter done, thanks.

## 2023-06-11 NOTE — Telephone Encounter (Signed)
 Paper work faxed to home supply company

## 2023-06-23 ENCOUNTER — Encounter: Payer: Self-pay | Admitting: Neurology

## 2023-06-23 ENCOUNTER — Ambulatory Visit (INDEPENDENT_AMBULATORY_CARE_PROVIDER_SITE_OTHER): Payer: Medicare Other | Admitting: Neurology

## 2023-06-23 VITALS — BP 125/85 | HR 86 | Ht 63.0 in | Wt 249.0 lb

## 2023-06-23 DIAGNOSIS — G43709 Chronic migraine without aura, not intractable, without status migrainosus: Secondary | ICD-10-CM | POA: Diagnosis not present

## 2023-06-23 DIAGNOSIS — Z9989 Dependence on other enabling machines and devices: Secondary | ICD-10-CM

## 2023-06-23 DIAGNOSIS — G35 Multiple sclerosis: Secondary | ICD-10-CM | POA: Diagnosis not present

## 2023-06-23 DIAGNOSIS — Z8619 Personal history of other infectious and parasitic diseases: Secondary | ICD-10-CM

## 2023-06-23 HISTORY — DX: Personal history of other infectious and parasitic diseases: Z86.19

## 2023-06-23 HISTORY — DX: Dependence on other enabling machines and devices: Z99.89

## 2023-06-23 NOTE — Patient Instructions (Signed)
 Always a pleasure to see you.   Proceed with MRIs as scheduled  2. Continue Botox  and Ocrevus as scheduled  3. Follow-up in 1 year, call for any changes

## 2023-06-23 NOTE — Progress Notes (Signed)
 NEUROLOGY FOLLOW UP OFFICE NOTE  Emily [name] [id number] 01/11/1980  HISTORY OF PRESENT ILLNESS: I had the pleasure of seeing Emily [name] in follow-up in the neurology clinic on 06/23/2023.  The patient was last seen a year ago for MS, migraines, neuropathy, and psychogenic non-epileptic events. Her daughter Debbi Failing is present with her today. Records and images were personally reviewed where available.  Since her last visit, she reports having left hand surgery in October after a fall, then right elbow surgery in December. She is able to have more range of motion with her arm but reports still needing help at home with daily ADLs. Migraines are controlled with Botox , she mostly has them just prior to her next Botox  session. Last Botox  with Dr. Festus Hubert was in 04/2023. She reports that Depakote  and Topiramate  were on back order so she just stopped taking them around 10 months ago with no worsening of migraines. She denies any functional seizures since 2021 and has had her driver's license reinstated. She continues to have low back pain and uses her rollator when sciatica is really bad. She has a cane for longer distances. No falls. She is on Ocrevus for MS, and is scheduled for annual surveillance MRIs under sedation in July. She gets fatigued faster. She continues to deal with urinary incontinence needing adult diapers, Flomax  and Myrbetriq  did not help. She has had shingles twice this year, most recently 3 weeks ago over her face and given Valacyclovir.   Her last MRI brain/cervical/thoracic spine done 06/2021 did not show any new lesions. There were numerous foci of FLAIR signal abnormality in the supratentorial white matter, patchy cord signal in the dorsal central cord at C2-3 and C4, right aspect of cord at T4, central cord at T6-7, unchanged from prior, no new or enhancing lesions seen.    History on Initial Assessment 03/28/2015: This is a pleasant 43 yo RH woman with a history of  relapsing-remitting multiple sclerosis, headaches, anxiety with panic attacks, conversion disorder with seizures. She presented at [redacted] weeks pregnant to determine if diazepam  for anxiety/seizures would be a medication she can use, understanding this is pregnancy category D versus risks of injuring herself from falls when she has the psychogenic seizures. Records from [facility] neurolog[facility] were reviewed. She was diagnosed with MS in June 2008 while living in [place]  state, she started having right thigh pain and right eye blurred vision. She was diagnosed with right optic neuritis and underwent MRI brain and lumbar puncture which confirmed a diagnosis of MS. 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 She has been on Gilenya  since 2014 without any relapse. This was discontinued in December 2016 when she found out she was pregnant. She has a history of chronic pain and migraines, and was taking Depakote  and Topamax  for headache prophylaxis, also stopped in December 2016. She reports having Botox  for her migraines, but so far migraines are not as bad as they were.  She started having shaking episodes in April 2016. Initially Depakote  dose was increased to 500mg  BID. She was back in May 2016 with multiple episodes of syncope, palpitations, a sensation of her heart jumping out of her chest. She also reported lightheadedness, facial numbness, and numbness in her hands and feet. On her last visit in June 2016 at [facility], shaking episodes were discussed. Events always occur after a  stressful event. She had a spell in [place] where she had a normal EEG and was diagnosed with a conversion disorder. She had seen Dr. Akintayo and reportedly told her he could not help her because her case was too complex. She started seeing a psychiatrist in [place], however  since she switched neurologists, she reports that he cannot see her anymore. She has not been able to establish care with [facility] and continues to have anxiety and panic attacks all the time. She is diaphoretic in the office today and reports she always feels anxious. When she starts having one of her seizures, she would have significant anxiety, my body can't deal and goes into a seizure, she hears her heartbeat in her ears. If it slowly builds up, she can get herself to a safe place, however other times it just hits me, and she falls to the ground and has injured herself. She reports falling out one time in the bathroom during a doctor visit. She is currently on Fluoxetine  and Buspirone . Clonazepam in the past did not help. She would usually go to the ER for these spells, which would quiet down once she gets Benadryl  and Diazepam .  She delivered early at [redacted] weeks gestation last July 12, 2015. A week after delivery, she started having numbness on the top of both feet, pain in the balls of her feet, with numbness traveling up to her mid-back. She also has numbness in both forearms, and reports her palms feel the same as her soles, with constant stabbing burning pain. She had these symptoms pre-pregnancy but states the numbness only went up to her knees. During pregnancy, all her symptoms were less bothersome. She has been following with Pain management in [facility] and would occasionally take Oxycodone  prior to her pregnancy.  Diagnostic Data:   I personally reviewed MRI brain with and without contrast done with [facility] last 10/02/13 which did not show any acute changes, there was multiple T2/FLAIR lesions in the subcortical and deep white matter regions, no abnormal enhancement. Hippocampi appear asymmetric, smaller on the right without abnormal signal or enhancement.   MRI C-spine with and without contrast 10/18/13 showed hazy T2 hyperintensity within the cord from C2-3 to C4-5 without abnormal  enhancement.  per [facility] - MRI brain done 03/18/14: No change in numerous multifocal T2 hyperintense white matter lesions relative to prior MRI from 04/17/2013 most consistent with demyelinating lesions given history of multiple sclerosis. No evidence of active demyelination.  MRI cervical spine done 03/18/14: Relative to prior MRI from 10/20/2009, no change in patchy foci of increased T2 signal in the dorsal cord at C2- C4 in keeping with demyelinating lesions in this patient with a clinical history of multiple sclerosis.   No evidence of enhancement to suggest active demyelination. Moderately advanced degenerative disease at C5-C6 and C6-C7 with interval development of left eccentric central, foraminal entry zone and foraminal disc extrusion at C6-C7 exerting mass effect on the exiting C7 and descending C8 roots.  MRI brain and C-spine 07/30/2015 with and without contrast : There was no abnormal enhancement. There were no changes from previous MRI brain in 09/2013 with multiple bilateral subcortical greater than 10 periventricular T2 hyperintensities. There were no cord lesions seen up to T3-4. There was chronic degenerative disc disease and central canal stenosis at C5-6 and C6-7, no abnormal cord signal, unchanged from 06/2014.  MRI brain, cervical and thoracic spine with and without contrast in October 2020 did not show any new lesions, no abnormal  enhancement. Stable findings with multiple scatted foci of FLAIR signal in the periventricular, deep, and juxtacortical white matter, subtle patchy cord signal abnormality at C2-4. She has degenerative disc osteophyte at C6-7 with moderate spinal stenosis, mild mass effect on the left greater than right spinal cord, advanced degenerative disc osteophyte at C5-6 with mild spinal stenosis and mild mass effect on left hemicord. Thoracic spine showed question subtle patchy cord signal abnormality at level of T6-7, T8-9, T9-10, no active demyelination. Mild  degenerative spondylosis with no stenosis.  EMG/NCV done on both UE and right LE showed bilateral carpal tunnel syndrome, mild to moderate in degree, no evidence of polyneuropathy or radiculopathy on the right side.  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   History of kidney stones    passed stones   HTN (hypertension) 02/27/2015   JC virus antibody positive    Migraines    MS (multiple sclerosis) (HCC) 06/2006   Neuropathy    OA (osteoarthritis)    Ovarian cyst    Panic attack    Pseudoseizures    last seizure was in 08/2018    PTSD (post-traumatic stress disorder)    Seasonal allergies    Urinary urgency    Vertigo    Wears contact lenses    Wears glasses     MEDICATIONS: Current Outpatient Medications on File Prior to Visit  Medication Sig Dispense Refill   albuterol  (PROVENTIL  HFA;VENTOLIN  HFA) 108 (90 Base) MCG/ACT inhaler Inhale 2 puffs into the lungs every 6 (six) hours as needed for wheezing or shortness of breath. 1 Inhaler 0   albuterol  (PROVENTIL ) (2.5 MG/3ML) 0.083% nebulizer solution Take 3 mLs (2.5 mg total) by nebulization every 6 (six) hours as needed for wheezing or shortness of breath. 75 mL 12   ALPRAZolam (XANAX) 0.25 MG tablet Take 0.25 mg by mouth 2 (two) times daily as needed for anxiety.     amLODipine -benazepril (LOTREL) 5-10 MG capsule Take 1 capsule by mouth daily.     ARIPiprazole (ABILIFY) 5 MG tablet Take 5 mg by mouth at bedtime.     botulinum toxin Type A  (BOTOX ) 200 units injection Inject 155 units IM into multiple site in the face,neck and head once every 90 days 1 each 4    busPIRone  (BUSPAR ) 5 MG tablet Take 5 mg by mouth 2 (two) times daily.     dronabinol  (MARINOL ) 2.5 MG capsule Take 1 capsule (2.5 mg total) by mouth 2 (two) times daily before lunch and supper. TAKE 1 CAPSULE BY MOUTH 3 TIMES DAILY WITH MEALS. 60 capsule 3   EPINEPHrine  0.3 mg/0.3 mL IJ SOAJ injection Inject 0.3 mg into the muscle as needed for anaphylaxis.     famotidine  (PEPCID ) 40 MG tablet Take 1 tablet (40 mg total) by mouth at bedtime. 90 tablet 3   guanFACINE (TENEX) 1 MG tablet Take 1 mg by mouth at bedtime.     loratadine  (CLARITIN ) 10 MG tablet Take 10 mg by mouth daily as needed for allergies.     medroxyPROGESTERone (  DEPO-PROVERA) 150 MG/ML injection Inject 150 mg into the muscle every 3 (three) months.     methocarbamol  (ROBAXIN ) 500 MG tablet Take 500 mg by mouth every 8 (eight) hours as needed for muscle spasms.     Naphazoline-Glycerin  (CLEAR EYES REDNESS RELIEF OP) Place 1 drop into both eyes 3 (three) times daily as needed (dry eyes).     ocrelizumab 600 mg in sodium chloride  0.9 % 500 mL Inject 600 mg into the vein every 6 (six) months.     omeprazole  (PRILOSEC) 20 MG capsule TAKE 1 CAPSULE BY MOUTH TWICE DAILY 60 capsule 5   omeprazole  (PRILOSEC) 20 MG capsule TAKE 1 CAPSULE BY MOUTH TWICE DAILY 60 capsule 5   oxyCODONE  (OXY IR/ROXICODONE ) 5 MG immediate release tablet Take 5 mg by mouth in the morning, at noon, and at bedtime.     pregabalin  (LYRICA ) 150 MG capsule Take 150 mg by mouth daily with lunch.     pregabalin  (LYRICA ) 225 MG capsule Take 1 capsule (225 mg total) by mouth 2 (two) times daily. 60 capsule 2   traZODone (DESYREL) 150 MG tablet Take 150 mg by mouth at bedtime.     benzonatate  (TESSALON ) 100 MG capsule Take 1 capsule (100 mg total) by mouth every 8 (eight) hours. (Patient not taking: Reported on 12/12/2022) 21 capsule 0   divalproex  (DEPAKOTE ) 500 MG DR tablet Take 1 tablet (500 mg total) by mouth at bedtime. (Patient not taking: Reported on 12/12/2022) 90  tablet 3   mirabegron  ER (MYRBETRIQ ) 25 MG TB24 tablet Take 1 tablet (25 mg total) by mouth daily. (Patient not taking: Reported on 12/12/2022) 30 tablet 11   ondansetron  (ZOFRAN ) 4 MG tablet Take 1 tablet (4 mg total) by mouth every 8 (eight) hours as needed for nausea or vomiting. (Patient not taking: Reported on 12/12/2022) 30 tablet 1   pantoprazole  (PROTONIX ) 40 MG tablet Take 1 tablet (40 mg total) by mouth daily. (Patient not taking: Reported on 12/12/2022) 30 tablet 11   potassium chloride  SA (KLOR-CON  M) 20 MEQ tablet Take 2 tablets (40 mEq total) by mouth daily for 4 days. (Patient not taking: Reported on 12/12/2022) 8 tablet 0   tamsulosin  (FLOMAX ) 0.4 MG CAPS capsule TAKE 1 CAPSULE BY MOUTH AS NEEDED WHEN STONE SYMPTOMS OCCUR (Patient not taking: Reported on 12/12/2022) 90 capsule 0   topiramate  (TOPAMAX ) 100 MG tablet Take 1 tablet three times a day (Patient not taking: Reported on 12/12/2022) 270 tablet 3   Vitamin D , Ergocalciferol , (DRISDOL ) 1.25 MG (50000 UNIT) CAPS capsule Take 1 capsule (50,000 Units total) by mouth every 7 (seven) days. For 8 weeks (Patient not taking: Reported on 12/12/2022) 8 capsule 0   Current Facility-Administered Medications on File Prior to Visit  Medication Dose Route Frequency Provider Last Rate Last Admin   botulinum toxin Type A  (BOTOX ) injection 200 Units  200 Units Intramuscular Once Festus Hubert, Adam R, DO       metoCLOPramide  (REGLAN ) injection 10 mg  10 mg Intravenous Once Zammit, Joseph, MD        ALLERGIES: Allergies  Allergen Reactions   Baclofen Hives and Shortness Of Breath   Cymbalta [Duloxetine Hcl] Shortness Of Breath and Rash   Gabapentin  Shortness Of Breath and Rash   Monosodium Glutamate Anaphylaxis   Other Shortness Of Breath, Rash and Other (See Comments)    MSG, beans (vomiting) MSG causes hives, itching, throat swelling   Acetaminophen  Nausea And Vomiting    Projectile vomiting   Alprazolam Other (  See Comments)    Lethargy Pt states  dosage was just too high; does well with other benzos   Amitriptyline Hypertension   Magnesium  Salicylate Hives and Itching   Modafinil Other (See Comments)    Seizures    Rizatriptan Nausea And Vomiting and Other (See Comments)    GI upset, Projectile vomiting    Tizanidine Hives    GI Upset (intolerance)   Adhesive [Tape] Other (See Comments)    Skin irritation Paper tape is ok   Hydrocodone-Acetaminophen  Hives and Nausea And Vomiting    Projectile vomiting   Ketorolac  Tromethamine  Nausea Only    Toradol     Lamotrigine Rash   Nsaids Nausea And Vomiting    Irritate stomach    Tramadol  Nausea And Vomiting    GI Upset (intolerance)    FAMILY HISTORY: Family History  Problem Relation Age of Onset   Diabetes Mother    Hypertension Mother    Diabetes Father    Hypertension Father    Arthritis Father    Cancer Maternal Grandmother    Cancer Maternal Grandfather    Alcohol  abuse Neg Hx    Anxiety disorder Neg Hx    Bipolar disorder Neg Hx    Drug abuse Neg Hx    Depression Neg Hx    Colon cancer Neg Hx     SOCIAL HISTORY: Social History   Socioeconomic History   Marital status: Single    Spouse name: Not on file   Number of children: 1   Years of education: College   Highest education level: Not on file  Occupational History    Employer: OTHER    Comment: disability  Tobacco Use   Smoking status: Former    Current packs/day: 0.00    Average packs/day: 0.3 packs/day for 10.0 years (2.5 ttl pk-yrs)    Types: Cigarettes    Start date: 12/2004    Quit date: 12/2014    Years since quitting: 8.5   Smokeless tobacco: Never  Vaping Use   Vaping status: Never Used  Substance and Sexual Activity   Alcohol  use: Not Currently    Comment: rare   Drug use: Not Currently    Comment: marinol  that shows up at thc   Sexual activity: Not Currently    Partners: Male    Birth control/protection: Injection    Comment: Depo Injections - Next inj due in Sept 2023  Other  Topics Concern   Not on file  Social History Narrative    Born and raised in [name], [place] by parents. Pt has one younger sister. 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 She stopped due to MS and is currently on disability.        Married for less than 1 yr in 1999 that ended in divorce.      Caffeine Use: 1 20oz soda daily and 1 cup of coffee      Lives with daughter apartment first floor   Right handed    Social Drivers of Health   Financial Resource Strain: Low Risk  (05/24/2021)   Received from [facility]   Overall Financial Resource Strain (CARDIA)    Difficulty of Paying Living Expenses: Not hard at all  Food Insecurity: No Food Insecurity (05/02/2023)   Received from [facility]   Hunger Vital Sign    Within the past 12 months, you worried that your food would run out before you got the money to buy more.: Never true  Within the past 12 months, the food you bought just didn't last and you didn't have money to get more.: Never true  Transportation Needs: No Transportation Needs (05/02/2023)   Received from [facility] - Transportation    Lack of Transportation (Medical): No    Lack of Transportation (Non-Medical): No  Physical Activity: Not on file  Stress: No Stress Concern Present (05/24/2021)   Received from [facility] - Occupational Stress Questionnaire    Feeling of Stress : Only a little  Social Connections: Unknown (05/07/2021)   Received from [facility]   Social Network    Social Network: Not on file  Intimate Partner Violence: Not At Risk (10/10/2021)   Received from [facility]   Humiliation, Afraid, Rape, and Kick questionnaire    Within the last year, have you been afraid of your partner or ex-partner?: No    Within the last year, have you been humiliated or emotionally abused in other ways by your partner or ex-partner?: No    Within the last year, have you  been kicked, hit, slapped, or otherwise physically hurt by your partner or ex-partner?: No    Within the last year, have you been raped or forced to have any kind of sexual activity by your partner or ex-partner?: No     PHYSICAL EXAM: Vitals:   06/23/23 1255  BP: 125/85  Pulse: 86  SpO2: 99%   General: No acute distress Head:  Normocephalic/atraumatic Skin/Extremities: No rash, no edema Neurological Exam: alert and awake. No aphasia or dysarthria. Fund of knowledge is appropriate.   Attention and concentration are normal.   Cranial nerves: Pupils equal, round. Extraocular movements intact with no nystagmus. Visual fields full.  No facial asymmetry.  Motor: Bulk and tone normal, muscle strength 5/5 on both UE and right LE, 4/5 proximal left LE. Finger to nose testing intact.  Gait slow and cautious, no ataxia. +bilateral endpoint tremor   IMPRESSION: This is a 43 yo RH woman with a history of relapsing-remitting multiple sclerosis, migraine with aura, anxiety with panic attacks, conversion disorder with psychogenic seizures, and neuropathy. She is overall doing well. She continues to have good response to Botox  for migraine prophylaxis and has stopped Depakote  and Topiramate  due to pharmacy issues. She is on Ocrevus for MS, next infusion is in August. She is scheduled for interval surveillance MRIs under sedation due to significant claustrophobia. She has not had any functional seizures since 2021 and is driving. Continue follow-up with Pain Management for back pain. Follow-up in 1 year, call for any changes.   Thank you for allowing me to participate in her care.  Please do not hesitate to call for any questions or concerns.    Rayfield Cairo, M.D.   CC: Forest Idol, NP

## 2023-06-24 ENCOUNTER — Telehealth: Payer: Self-pay | Admitting: Neurology

## 2023-06-24 NOTE — Telephone Encounter (Signed)
 Pt spoke with provider at [facility] program. Please have provider call [phone] so they can get more information on this program

## 2023-06-24 NOTE — Telephone Encounter (Signed)
 Pls check with Kasiya what she wants us  to do. I thought she was going to ask them if answering yes to question about risk for being institutionalized will affect ability to care for daughter. Was she able to ask?

## 2023-06-24 NOTE — Telephone Encounter (Signed)
 She said that it keeps people out of places they bring people into the home to help. They will not take her daughter, she would like you to do the form.

## 2023-06-27 ENCOUNTER — Telehealth: Payer: Self-pay | Admitting: Neurology

## 2023-06-27 NOTE — Telephone Encounter (Signed)
 Pt is calling to see if the forms have been filled out she sent them by mychart on 06-23-23

## 2023-06-27 NOTE — Telephone Encounter (Signed)
 Done, she needs to fill out pages 2 and 4. thanks

## 2023-06-27 NOTE — Telephone Encounter (Signed)
 Pt wants paperwork mailed to her,

## 2023-07-03 NOTE — Telephone Encounter (Signed)
 Done

## 2023-07-04 ENCOUNTER — Encounter: Payer: Self-pay | Admitting: Neurology

## 2023-07-07 ENCOUNTER — Telehealth: Payer: Self-pay

## 2023-07-07 ENCOUNTER — Other Ambulatory Visit: Payer: Self-pay

## 2023-07-07 ENCOUNTER — Other Ambulatory Visit (HOSPITAL_COMMUNITY): Payer: Self-pay

## 2023-07-07 NOTE — Telephone Encounter (Signed)
 Looks like pt's PA auto renewed to expire on 12.31.25. Will submit refill request to [facility].

## 2023-07-07 NOTE — Progress Notes (Signed)
 Specialty Pharmacy Refill Coordination Note  Clear Bag Patient  Emily [name] is a 43 y.o. female contacted today regarding refills of specialty medication(s) OnabotulinumtoxinA  (BOTOX )  Injection appointment: 07/18/23.   Patient requested: Courier to Provider Office   Delivery date: 07/14/23   Verified address: [address], [address]  [place] [address]  Medication will be filled on 07/10/23.

## 2023-07-07 NOTE — Telephone Encounter (Signed)
 PA needed for 07/18/23 visit

## 2023-07-09 ENCOUNTER — Other Ambulatory Visit: Payer: Self-pay

## 2023-07-10 ENCOUNTER — Other Ambulatory Visit: Payer: Self-pay

## 2023-07-15 ENCOUNTER — Encounter (HOSPITAL_COMMUNITY): Payer: Self-pay | Admitting: *Deleted

## 2023-07-15 ENCOUNTER — Other Ambulatory Visit: Payer: Self-pay

## 2023-07-15 NOTE — Progress Notes (Addendum)
 PCP - Dr Norman Larve Cardiologist - n/a  Chest x-ray - n/a EKG - DOS Stress Test - n/a ECHO - n/a Cardiac Cath - n/a  ICD Pacemaker/Loop - n/a  Sleep Study -  Yes (07/2018), neg study CPAP - none  Diabetes -n/a  Aspirin  & Blood Thinner Instructions:  n/a  NPO  Anesthesia review: Yes  STOP now taking any Aspirin  (unless otherwise instructed by your surgeon), Aleve, Naproxen, Ibuprofen , Motrin , Advil , Goody's, BC's, all herbal medications, fish oil, and all vitamins.   Coronavirus Screening Do you have any of the following symptoms:  Cough yes/no: No Fever (>100.19F)  yes/no: No Runny nose yes/no: No Sore throat yes/no: No Difficulty breathing/shortness of breath  yes/no: No  Have you traveled in the last 14 days and where? yes/no: No  Patient verbalized understanding of instructions that were given via phone.

## 2023-07-15 NOTE — Anesthesia Preprocedure Evaluation (Signed)
 Anesthesia Evaluation  Patient identified by MRN, date of birth, ID band Patient awake    Reviewed: Allergy & Precautions, NPO status , Patient's Chart, lab work & pertinent test results  Airway Mallampati: II  TM Distance: >3 FB Neck ROM: Full    Dental  (+) Teeth Intact, Dental Advisory Given   Pulmonary sleep apnea , former smoker   breath sounds clear to auscultation       Cardiovascular hypertension, Pt. on home beta blockers + dysrhythmias  Rhythm:Regular Rate:Normal  Echo:  1. Left ventricular ejection fraction, by estimation, is 60 to 65%. The  left ventricle has normal function. The left ventricle has no regional  wall motion abnormalities. There is moderate concentric left ventricular  hypertrophy. Left ventricular  diastolic parameters were normal.   2. Right ventricular systolic function is normal. The right ventricular  size is normal.   3. Left atrial size was mild to moderately dilated.   4. The mitral valve is normal in structure. Trivial mitral valve  regurgitation. No evidence of mitral stenosis.   5. The aortic valve is tricuspid. Aortic valve regurgitation is mild. No  aortic stenosis is present.   6. The inferior vena cava is normal in size with greater than 50%  respiratory variability, suggesting right atrial pressure of 3 mmHg.     Neuro/Psych  Headaches  negative psych ROS   GI/Hepatic negative GI ROS, Neg liver ROS,,,  Endo/Other  negative endocrine ROS    Renal/GU negative Renal ROS     Musculoskeletal  (+) Arthritis ,    Abdominal   Peds  Hematology negative hematology ROS (+)   Anesthesia Other Findings   Reproductive/Obstetrics                              Anesthesia Physical Anesthesia Plan  ASA: 2  Anesthesia Plan: General   Post-op Pain Management: Tylenol  PO (pre-op)*   Induction: Intravenous  PONV Risk Score and Plan: 3 and Ondansetron ,  Dexamethasone  and Midazolam   Airway Management Planned: Oral ETT  Additional Equipment: None  Intra-op Plan:   Post-operative Plan: Extubation in OR  Informed Consent: I have reviewed the patients History and Physical, chart, labs and discussed the procedure including the risks, benefits and alternatives for the proposed anesthesia with the patient or authorized representative who has indicated his/her understanding and acceptance.     Dental advisory given  Plan Discussed with: CRNA  Anesthesia Plan Comments: (PAT note written 07/15/2023 by Hagan Vanauken, PA-C.  )         Anesthesia Quick Evaluation

## 2023-07-15 NOTE — Progress Notes (Signed)
 Anesthesia Chart Review:  Case: [id number] Date/Time: 07/17/23 0745   Procedure: MRI BRAIN,  MRCERVICAL SPINE WITHOUT CONTRAST; MR THORACIC WITH ANESTHESIA   Anesthesia type: General   Pre-op diagnosis: MULTIPLE SCELORIS   [facility]   Surgeons: Radiologist, Medication, MD       DISCUSSION: Patient is a 43 year old female scheduled for the above procedure for surveillance of MS.  MRI under anesthesia ordered due to severe claustrophobia.  H&P form completed by Dr. Georjean on 07/08/23 (scanned under Media tab).   History includes former smoker (quit 12/08/2014), multiple sclerosis, JC virus Ab+, neuropathy, HTN, GERD, fatty liver, childhood asthma, PTSD, conversion disorder, pseudoseizures, ADHD, panic attacks, chronic pain, perforated bowel (s/p colon resection with colostomy 2009, subsequent colostomy take down), ventral hernia repair (01/21/11), migraines (received Botox  injections), right closed radial head fracture (s/p right radial head replacement 11/2021; s/p right elbow heterotopic ossification resection 12/24/22), obesity.    Per Progress Note by neurologist Dr. Georjean on 06/23/23, This is a 43 yo RH woman with a history of relapsing-remitting multiple sclerosis, migraine with aura, anxiety with panic attacks, conversion disorder with psychogenic seizures, and neuropathy. She is overall doing well. She continues to have good response to Botox  for migraine prophylaxis and has stopped Depakote  and Topiramate  due to pharmacy issues. She is on Ocrevus for MS, next infusion is in August. She is scheduled for interval surveillance MRIs under sedation due to significant claustrophobia. She has not had any functional seizures since 2021 and is driving. Continue follow-up with Pain Management for back pain. Follow-up in 1 year, call for any changes.  Anesthesia team to evaluate on the day of procedure.  VS: Ht 5' 3 (1.6 m)   Wt 112.9 kg   BMI 44.09 kg/m  BP Readings from  Last 3 Encounters:  06/23/23 125/85  07/10/22 125/87  01/14/22 (!) 156/92   Pulse Readings from Last 3 Encounters:  06/23/23 86  07/10/22 85  06/19/22 74     PROVIDERS: Jerel Gee, NP is PCP  Georjean Pao, MD is neurologist    LABS: For day of procedure as indicated. Most recent lab results in [facility] include: Lab Results  Component Value Date   WBC 12.2 (H) 07/17/2022   HGB 14.3 07/17/2022   HCT 44.0 07/17/2022   PLT 391.0 07/17/2022   GLUCOSE 81 07/17/2022   ALT 11 07/17/2022   AST 12 07/17/2022   NA 140 07/17/2022   K 4.2 07/17/2022   CL 102 07/17/2022   CREATININE 1.00 07/17/2022   BUN 16 07/17/2022   CO2 27 07/17/2022   TSH 1.430 08/13/2018   INR 1.0 06/08/2018    Last EKG notes is > 1 year ago.     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   HTN (hypertension) 02/27/2015  JC virus antibody positive    Migraines    MS (multiple sclerosis) (HCC) 06/2006   Neuropathy    OA (osteoarthritis)    Ovarian cyst    Panic attack    Pneumonia    x 1   Pseudoseizures    last seizure was in 08/2018    PTSD (post-traumatic stress disorder)    Seasonal allergies    Urinary urgency    Uses roller walker 06/23/2023   also has cane   Vertigo    Wears contact lenses    Wears glasses     Past Surgical History:  Procedure Laterality Date   ABDOMINAL SURGERY     ABDOMINAL WOUND EXPLORATION   03/19/2019   Abdominal Wound Exploration with  Explantation of Mesh   APPENDECTOMY     BOWEL RESECTION  01/2007   with colostomy   BOWEL RESECTION N/A 06/14/2019   Procedure: SMALL BOWEL RESECTION;  Surgeon: Rubin Calamity, MD;  Location[facility] OR;  Service: General;  Laterality: N/A;   CESAREAN SECTION N/A 07/12/2015   Procedure: CESAREAN SECTION;  Surgeon: Burnard VEAR Pate, MD;  Location: [facility];  Service: Obstetrics;  Laterality: N/A;   COLON SURGERY     part of colon and intestines removed; colostomy 2008   COLONOSCOPY WITH PROPOFOL  N/A 01/29/2017   Procedure: COLONOSCOPY WITH PROPOFOL ;  Surgeon: Teresa Lonni HERO, MD;  Location: [facility];  Service: General;  Laterality: N/A;   COLOSTOMY CLOSURE  04/2007   ESOPHAGEAL DILATION  08/22/2021   Procedure: ESOPHAGEAL DILATION;  Surgeon: Shaaron Lamar HERO, MD;  [facility];  Service: Endoscopy;;   ESOPHAGOGASTRODUODENOSCOPY (EGD) WITH PROPOFOL  N/A 08/22/2021   Procedure: ESOPHAGOGASTRODUODENOSCOPY (EGD) WITH PROPOFOL ;  Surgeon: Shaaron Lamar HERO, MD;  [facility];  Service: Endoscopy;  Laterality: N/A;  9:30am, asa 3   EVALUATION UNDER ANESTHESIA WITH HEMORRHOIDECTOMY N/A 06/11/2018   Procedure: ANORECTAL EXAM UNDER ANESTHESIA WITH HEMORRHOIDECTOMY;  Surgeon: Teresa Lonni HERO, MD;  Location: [facility];  Service: General;  Laterality: N/A;   EXCISIONAL HEMORRHOIDECTOMY     EXTREMITY CYST EXCISION  1994   right leg   INCISIONAL HERNIA REPAIR N/A 02/16/2016   Procedure: HERNIA REPAIR INCISIONAL;  Surgeon: Elon Pacini, MD;  Location: [facility];  Service: General;  Laterality: N/A;   INSERTION OF MESH  02/16/2016   Procedure: INSERTION OF MESH;  Surgeon: Elon Pacini, MD;  Location: [facility];  Service: General;;   LAPAROSCOPY N/A 06/14/2019   Procedure: ATTEMPTED DIAGNOSTIC LAPAROSCOPY;  Surgeon: Rubin Calamity, MD;  Location: [facility] OR;  Service: General;  Laterality: N/A;   LAPAROTOMY  03/19/2019   Procedure: Abdominal Wound Exploration  with Explantation of Mesh;  Surgeon: Rubin Calamity, MD;  Location: [facility] OR;  Service: General;;   LAPAROTOMY N/A 06/14/2019   Procedure: EXPLORATORY LAPAROTOMY;  Surgeon: Rubin Calamity, MD;  Location: [facility] OR;  Service: General;  Laterality: N/A;   LYSIS OF ADHESION N/A 06/14/2019   Procedure: LYSIS OF ADHESIONS;  Surgeon: Rubin Calamity, MD;  Location: [facility] OR;  Service: General;  Laterality: N/A;   RADIOLOGY WITH ANESTHESIA N/A 03/06/2017   Procedure: MRI WITH ANESTHESIA OF CERVICAL SPINE WITHOUT CONTRAST, MRI OF LUMBAR SPINE WITHOUT CONTRAST;  Surgeon: Radiologist, Medication, MD;  [facility];  Service: Radiology;  Laterality: N/A;   RADIOLOGY WITH ANESTHESIA N/A 09/15/2018   Procedure: MRI OF BRAIN WITH AND WITHOUT CONTRAST;  Surgeon: Radiologist, Medication, MD;  [facility];  Service: Radiology;  Laterality: N/A;  RADIOLOGY WITH ANESTHESIA N/A 10/13/2018   Procedure: MRI OF BRAIN WITH AND WITHOUT CONTRAST;  Surgeon: Radiologist, Medication, MD;  [facility];  Service: Radiology;  Laterality: N/A;   RADIOLOGY WITH ANESTHESIA N/A 10/26/2018   Procedure: MRI UNDER ANESTHESIA; HEAD, CERVICAL AND THORASCIC SPINE;  Surgeon: Radiologist, Medication, MD;  [facility];  Service: Radiology;  Laterality: N/A;   RADIOLOGY WITH ANESTHESIA N/A 06/08/2020   Procedure: MRI WITH ANESTHESIA BRAIN WITH AND WITHOUT CONTRAST.,  CERVICAL BRAIN WITH AND WITHOUT CONTRAST, THORACIC SPINE WITH AND WITHOUT;  Surgeon: Radiologist, Medication, MD;  Location[facility];  Service: Radiology;  Laterality: N/A;   RADIOLOGY WITH ANESTHESIA N/A 07/03/2021   Procedure: MRI BRAIN WITH OR WITHOUT CONTRAST WITH ANESTHESIA;  Surgeon: Radiologist, Medication, MD;  Location[facility];  Service: Radiology;  Laterality: N/A;   SCAR REVISION  01/21/2011   Procedure: SCAR REVISION;  Surgeon: Elna LITTIE Pick;  Location: [facility];  Service: Plastics;  Laterality: N/A;  exploration of scar of abdomen and  repair of defect   TRANSRECTAL DRAINAGE OF PELVIC ABSCESS      MEDICATIONS:  botulinum toxin Type A  (BOTOX ) injection 200 Units    albuterol  (PROVENTIL  HFA;VENTOLIN  HFA) 108 (90 Base) MCG/ACT inhaler   albuterol  (PROVENTIL ) (2.5 MG/3ML) 0.083% nebulizer solution   ALPRAZolam (XANAX) 0.25 MG tablet   amLODipine -benazepril (LOTREL) 5-10 MG capsule   ARIPiprazole (ABILIFY) 5 MG tablet   botulinum toxin Type A  (BOTOX ) 200 units injection   busPIRone  (BUSPAR ) 5 MG tablet   EPINEPHrine  0.3 mg/0.3 mL IJ SOAJ injection   famotidine  (PEPCID ) 40 MG tablet   guanFACINE (TENEX) 1 MG tablet   loratadine  (CLARITIN ) 10 MG tablet   methocarbamol  (ROBAXIN ) 500 MG tablet   Naphazoline-Glycerin  (CLEAR EYES REDNESS RELIEF OP)   OCREVUS 300 MG/10ML injection   omeprazole  (PRILOSEC) 20 MG capsule   oxyCODONE  (OXY IR/ROXICODONE ) 5 MG immediate release tablet   pregabalin  (LYRICA ) 150 MG capsule   pregabalin  (LYRICA ) 225 MG capsule   trazodone (DESYREL) 300 MG tablet    metoCLOPramide  (REGLAN ) injection 10 mg    Genelda Roark, PA-C Surgical Short Stay/Anesthesiology [facility] Phone [phone] [facility] Phone [phone] 07/15/2023 1:06 PM

## 2023-07-17 ENCOUNTER — Ambulatory Visit: Payer: Self-pay | Admitting: Neurology

## 2023-07-17 ENCOUNTER — Other Ambulatory Visit: Payer: Self-pay

## 2023-07-17 ENCOUNTER — Ambulatory Visit (HOSPITAL_BASED_OUTPATIENT_CLINIC_OR_DEPARTMENT_OTHER): Payer: Self-pay | Admitting: Vascular Surgery

## 2023-07-17 ENCOUNTER — Encounter (HOSPITAL_COMMUNITY)
Admission: RE | Disposition: A | Discharge status: Home / Self Care | Payer: Self-pay | Source: Home / Self Care | Attending: Neurology

## 2023-07-17 ENCOUNTER — Ambulatory Visit (HOSPITAL_COMMUNITY): Payer: Self-pay | Admitting: Vascular Surgery

## 2023-07-17 ENCOUNTER — Ambulatory Visit (HOSPITAL_COMMUNITY)
Admission: RE | Admit: 2023-07-17 | Discharge: 2023-07-17 | Disposition: A | Discharge status: Home / Self Care | Payer: Medicare Other | Source: Ambulatory Visit | Attending: Neurology | Admitting: Neurology

## 2023-07-17 ENCOUNTER — Ambulatory Visit (HOSPITAL_COMMUNITY): Payer: Medicare Other

## 2023-07-17 ENCOUNTER — Encounter (HOSPITAL_COMMUNITY): Payer: Self-pay

## 2023-07-17 ENCOUNTER — Ambulatory Visit (HOSPITAL_COMMUNITY)
Admission: RE | Admit: 2023-07-17 | Discharge: 2023-07-17 | Disposition: A | Discharge status: Home / Self Care | Payer: Medicare Other | Attending: Neurology | Admitting: Neurology

## 2023-07-17 DIAGNOSIS — G629 Polyneuropathy, unspecified: Secondary | ICD-10-CM | POA: Diagnosis not present

## 2023-07-17 DIAGNOSIS — F418 Other specified anxiety disorders: Secondary | ICD-10-CM | POA: Diagnosis not present

## 2023-07-17 DIAGNOSIS — G8929 Other chronic pain: Secondary | ICD-10-CM | POA: Diagnosis not present

## 2023-07-17 DIAGNOSIS — G35 Multiple sclerosis: Secondary | ICD-10-CM

## 2023-07-17 DIAGNOSIS — G43909 Migraine, unspecified, not intractable, without status migrainosus: Secondary | ICD-10-CM | POA: Diagnosis not present

## 2023-07-17 DIAGNOSIS — Z87891 Personal history of nicotine dependence: Secondary | ICD-10-CM | POA: Diagnosis not present

## 2023-07-17 DIAGNOSIS — G40909 Epilepsy, unspecified, not intractable, without status epilepticus: Secondary | ICD-10-CM

## 2023-07-17 DIAGNOSIS — I10 Essential (primary) hypertension: Secondary | ICD-10-CM

## 2023-07-17 HISTORY — PX: RADIOLOGY WITH ANESTHESIA: SHX6223

## 2023-07-17 LAB — BASIC METABOLIC PANEL WITH GFR
Anion gap: 13 (ref 5–15)
BUN: 9 mg/dL (ref 6–20)
CO2: 23 mmol/L (ref 22–32)
Calcium: 9.2 mg/dL (ref 8.9–10.3)
Chloride: 100 mmol/L (ref 98–111)
Creatinine, Ser: 0.85 mg/dL (ref 0.44–1.00)
GFR, Estimated: >60 mL/min (ref >60)
Glucose, Bld: 100 mg/dL — ABNORMAL HIGH (ref 70–99)
Potassium: 3.3 mmol/L — ABNORMAL LOW (ref 3.5–5.1)
Sodium: 136 mmol/L (ref 135–145)

## 2023-07-17 LAB — CBC
HCT: 44.6 % (ref 36.0–46.0)
Hemoglobin: 15 g/dL (ref 12.0–15.0)
MCH: 29.7 pg (ref 26.0–34.0)
MCHC: 33.6 g/dL (ref 30.0–36.0)
MCV: 88.3 fL (ref 80.0–100.0)
Platelets: 342 K/uL (ref 150–400)
RBC: 5.05 MIL/uL (ref 3.87–5.11)
RDW: 12.8 % (ref 11.5–15.5)
WBC: 16.3 K/uL — ABNORMAL HIGH (ref 4.0–10.5)
nRBC: 0 % (ref 0.0–0.2)

## 2023-07-17 LAB — POCT PREGNANCY, URINE: Preg Test, Ur: NEGATIVE

## 2023-07-17 SURGERY — MRI WITH ANESTHESIA
Anesthesia: General

## 2023-07-17 MED ORDER — SUGAMMADEX SODIUM 200 MG/2ML IV SOLN
INTRAVENOUS | Status: DC | PRN
  Administered 2023-07-17: 300 mg via INTRAVENOUS

## 2023-07-17 MED ORDER — LACTATED RINGERS IV SOLN: INTRAVENOUS | Status: DC

## 2023-07-17 MED ORDER — CHLORHEXIDINE GLUCONATE 0.12 % MT SOLN
OROMUCOSAL | Status: AC
  Administered 2023-07-17: 15 mL via OROMUCOSAL
  Filled 2023-07-17: qty 15

## 2023-07-17 MED ORDER — ONDANSETRON HCL 4 MG/2ML IJ SOLN
INTRAMUSCULAR | Status: DC | PRN
  Administered 2023-07-17: 4 mg via INTRAVENOUS

## 2023-07-17 MED ORDER — PHENYLEPHRINE HCL-NACL 20-0.9 MG/250ML-% IV SOLN
INTRAVENOUS | Status: DC | PRN
  Administered 2023-07-17: 25 ug/min via INTRAVENOUS

## 2023-07-17 MED ORDER — CHLORHEXIDINE GLUCONATE 0.12 % MT SOLN: 15.0000 mL | Freq: Once | OROMUCOSAL | Status: AC

## 2023-07-17 MED ORDER — EPHEDRINE SULFATE-NACL 50-0.9 MG/10ML-% IV SOSY
PREFILLED_SYRINGE | INTRAVENOUS | Status: DC | PRN
  Administered 2023-07-17 (×2): 10 mg via INTRAVENOUS
  Administered 2023-07-17: 5 mg via INTRAVENOUS

## 2023-07-17 MED ORDER — MIDAZOLAM HCL 2 MG/2ML IJ SOLN
INTRAMUSCULAR | Status: DC | PRN
  Administered 2023-07-17: 2 mg via INTRAVENOUS
  Administered 2023-07-17 (×2): 1 mg via INTRAVENOUS

## 2023-07-17 MED ORDER — PROPOFOL 10 MG/ML IV BOLUS
INTRAVENOUS | Status: DC | PRN
  Administered 2023-07-17: 150 mg via INTRAVENOUS

## 2023-07-17 MED ORDER — LIDOCAINE 2% (20 MG/ML) 5 ML SYRINGE
INTRAMUSCULAR | Status: DC | PRN
  Administered 2023-07-17: 60 mg via INTRAVENOUS

## 2023-07-17 MED ORDER — ROCURONIUM BROMIDE 10 MG/ML (PF) SYRINGE
PREFILLED_SYRINGE | INTRAVENOUS | Status: DC | PRN
  Administered 2023-07-17: 20 mg via INTRAVENOUS
  Administered 2023-07-17: 50 mg via INTRAVENOUS

## 2023-07-17 MED ORDER — PHENYLEPHRINE 80 MCG/ML (10ML) SYRINGE FOR IV PUSH (FOR BLOOD PRESSURE SUPPORT)
PREFILLED_SYRINGE | INTRAVENOUS | Status: DC | PRN
  Administered 2023-07-17: 80 ug via INTRAVENOUS
  Administered 2023-07-17 (×4): 160 ug via INTRAVENOUS

## 2023-07-17 MED ORDER — ORAL CARE MOUTH RINSE: 15.0000 mL | Freq: Once | OROMUCOSAL | Status: AC

## 2023-07-17 MED ORDER — GADOBUTROL 1 MMOL/ML IV SOLN
10.0000 mL | Freq: Once | INTRAVENOUS | Status: AC | PRN
  Administered 2023-07-17: 10 mL via INTRAVENOUS

## 2023-07-17 NOTE — Transfer of Care (Signed)
 Immediate Anesthesia Transfer of Care Note  Patient: Emily [name]  Procedure(s) Performed: MRI BRAIN,  MRCERVICAL SPINE WITHOUT CONTRAST; MR THORACIC WITH ANESTHESIA  Patient Location: PACU  Anesthesia Type:General  Level of Consciousness: awake, alert , and oriented  Airway & Oxygen Therapy: Patient Spontanous Breathing  Post-op Assessment: Report given to RN, Post -op Vital signs reviewed and stable, and Patient moving all extremities X 4  Post vital signs: Reviewed and stable  Last Vitals:  Vitals Value Taken Time  BP 86/42 07/17/23 10:45  Temp    Pulse 101 07/17/23 10:47  Resp 21 07/17/23 10:47  SpO2 98 % 07/17/23 10:47  Vitals shown include unfiled device data.  Last Pain:  Vitals:   07/17/23 1015  TempSrc:   PainSc: 0-No pain      Patients Stated Pain Goal: 5 (07/17/23 9355)  Complications: No notable events documented.

## 2023-07-17 NOTE — Anesthesia Procedure Notes (Signed)
 Procedure Name: Intubation Date/Time: 07/17/2023 8:15 AM  Performed by: Mollie Olivia SAUNDERS, CRNAPre-anesthesia Checklist: Patient identified, Emergency Drugs available, Suction available and Patient being monitored Patient Re-evaluated:Patient Re-evaluated prior to induction Oxygen Delivery Method: Circle System Utilized Preoxygenation: Pre-oxygenation with 100% oxygen Induction Type: IV induction and Cricoid Pressure applied Ventilation: Oral airway inserted - appropriate to patient size and Two handed mask ventilation required Laryngoscope Size: 3 and Mac Grade View: Grade II Tube type: Oral Tube size: 7.0 mm Number of attempts: 1 Airway Equipment and Method: Stylet and Oral airway Placement Confirmation: ETT inserted through vocal cords under direct vision, positive ETCO2 and breath sounds checked- equal and bilateral Secured at: 22 cm Tube secured with: Tape Dental Injury: Teeth and Oropharynx as per pre-operative assessment  Difficulty Due To: Difficulty was anticipated Comments: PT HAD ANTERIOR AW USED CRICOID TO VISUALIZE ARYTENOIDS; 2 PERSON WAS MAINLY D/T AWKWARDNESS OF MRI POSITIONING OF BED IN THE SUITE SO WE 2 PERSON TO MAKE IT EASIER.

## 2023-07-18 ENCOUNTER — Ambulatory Visit: Admitting: Neurology

## 2023-07-18 ENCOUNTER — Encounter (HOSPITAL_COMMUNITY): Payer: Self-pay | Admitting: Radiology

## 2023-07-18 NOTE — Anesthesia Postprocedure Evaluation (Signed)
 Anesthesia Post Note  Patient: Emily [name]  Procedure(s) Performed: MRI BRAIN,  MRCERVICAL SPINE WITHOUT CONTRAST; MR THORACIC WITH ANESTHESIA     Patient location during evaluation: PACU Anesthesia Type: General Level of consciousness: awake and alert Pain management: pain level controlled Vital Signs Assessment: post-procedure vital signs reviewed and stable Respiratory status: spontaneous breathing, nonlabored ventilation, respiratory function stable and patient connected to nasal cannula oxygen Cardiovascular status: blood pressure returned to baseline and stable Postop Assessment: no apparent nausea or vomiting Anesthetic complications: no   No notable events documented.  Last Vitals:  Vitals:   07/17/23 1045 07/17/23 1100  BP: 130/71 116/88  Pulse: 97 99  Resp: 14 15  Temp:  36.9 C  SpO2: 95% 99%    Last Pain:  Vitals:   07/17/23 1100  TempSrc:   PainSc: 0-No pain                 Emmanuelle Hibbitts S

## 2023-07-22 ENCOUNTER — Telehealth: Payer: Self-pay | Admitting: Neurology

## 2023-07-22 NOTE — Telephone Encounter (Signed)
 Pt called in stating she had her blood work done and her white count was high the day they did her MRI. She is wanting to find out if that is something she should be concerned about? It didn't pop up on her mychart until a couple of days after she got her MRI results

## 2023-07-23 ENCOUNTER — Other Ambulatory Visit: Payer: Self-pay | Admitting: *Deleted

## 2023-07-23 DIAGNOSIS — G40909 Epilepsy, unspecified, not intractable, without status epilepticus: Secondary | ICD-10-CM

## 2023-07-23 NOTE — Telephone Encounter (Signed)
 Has she been sick recently? If not, would repeat CBC and CMP to follow-up, thanks

## 2023-07-23 NOTE — Telephone Encounter (Signed)
 Pt agreed to come for lab next week. Future order for CMP and CBC.

## 2023-08-01 ENCOUNTER — Other Ambulatory Visit

## 2023-08-01 ENCOUNTER — Other Ambulatory Visit: Payer: Self-pay | Admitting: *Deleted

## 2023-08-01 DIAGNOSIS — G40909 Epilepsy, unspecified, not intractable, without status epilepticus: Secondary | ICD-10-CM

## 2023-08-01 LAB — CBC WITH DIFFERENTIAL/PLATELET
Absolute Lymphocytes: 1976 {cells}/uL (ref 850–3900)
Absolute Monocytes: 713 {cells}/uL (ref 200–950)
Basophils Absolute: 65 {cells}/uL (ref 0–200)
Basophils Relative: 0.6 %
Eosinophils Absolute: 65 {cells}/uL (ref 15–500)
Eosinophils Relative: 0.6 %
HCT: 45.9 % — ABNORMAL HIGH (ref 35.0–45.0)
Hemoglobin: 14.5 g/dL (ref 11.7–15.5)
MCH: 29.1 pg (ref 27.0–33.0)
MCHC: 31.6 g/dL — ABNORMAL LOW (ref 32.0–36.0)
MCV: 92 fL (ref 80.0–100.0)
MPV: 11.3 fL (ref 7.5–12.5)
Monocytes Relative: 6.6 %
Neutro Abs: 7981 {cells}/uL — ABNORMAL HIGH (ref 1500–7800)
Neutrophils Relative %: 73.9 %
Platelets: 389 Thousand/uL (ref 140–400)
RBC: 4.99 Million/uL (ref 3.80–5.10)
RDW: 12.6 % (ref 11.0–15.0)
Total Lymphocyte: 18.3 %
WBC: 10.8 Thousand/uL (ref 3.8–10.8)

## 2023-08-01 LAB — COMPREHENSIVE METABOLIC PANEL WITH GFR
AG Ratio: 1.6 (calc) (ref 1.0–2.5)
ALT: 12 U/L (ref 6–29)
AST: 12 U/L (ref 10–35)
Albumin: 4 g/dL (ref 3.6–5.1)
Alkaline phosphatase (APISO): 97 U/L (ref 31–125)
BUN: 11 mg/dL (ref 7–25)
CO2: 28 mmol/L (ref 20–32)
Calcium: 9.3 mg/dL (ref 8.6–10.2)
Chloride: 105 mmol/L (ref 98–110)
Creat: 0.82 mg/dL (ref 0.50–0.99)
Globulin: 2.5 g/dL (ref 1.9–3.7)
Glucose, Bld: 95 mg/dL (ref 65–99)
Potassium: 4.3 mmol/L (ref 3.5–5.3)
Sodium: 143 mmol/L (ref 135–146)
Total Bilirubin: 0.3 mg/dL (ref 0.2–1.2)
Total Protein: 6.5 g/dL (ref 6.1–8.1)
eGFR: 90 mL/min/1.73m2 (ref >=60)

## 2023-08-05 ENCOUNTER — Ambulatory Visit: Payer: Medicare Other | Admitting: Neurology

## 2023-08-06 ENCOUNTER — Telehealth: Payer: Self-pay | Admitting: Neurology

## 2023-08-06 ENCOUNTER — Ambulatory Visit: Payer: Self-pay | Admitting: Neurology

## 2023-08-06 NOTE — Telephone Encounter (Signed)
 Pls let her know the repeat bloodwork now shows normal white count. Thanks

## 2023-08-06 NOTE — Telephone Encounter (Signed)
 Notified pt  w/ lab resuts. Pt stated-since the MRI done--very lethargic, cramps around mid-section, walking slow, low fever 99.5. pt questioning if any other testing can be done. Pt have an appt GYN in [facility] for Sx consultation in Sept.

## 2023-08-06 NOTE — Telephone Encounter (Signed)
**Note De-identified  [place] Obfuscation** Please advise 

## 2023-08-06 NOTE — Telephone Encounter (Signed)
 These do not sound neurological, I strongly recommend PCP evaluation for these symptoms. Thanks

## 2023-08-06 NOTE — Telephone Encounter (Signed)
 Pt called to get results of her labs.

## 2023-08-06 NOTE — Telephone Encounter (Signed)
 LVM regarding message below.

## 2023-08-08 ENCOUNTER — Telehealth: Payer: Self-pay | Admitting: *Deleted

## 2023-08-08 ENCOUNTER — Other Ambulatory Visit: Payer: Self-pay | Admitting: Gastroenterology

## 2023-08-08 NOTE — Telephone Encounter (Signed)
 Pt called stated--after the MRI in 07/17/23--having dizziness, lethargic, dry mouth, weakness left side and fell yesterday dizziness. Please advise

## 2023-08-08 NOTE — Telephone Encounter (Signed)
 With these symptoms, she needs to be evaluated in the ER. Thanks

## 2023-08-08 NOTE — Telephone Encounter (Signed)
 Pt agreed to go to the ER.

## 2023-08-15 ENCOUNTER — Ambulatory Visit (INDEPENDENT_AMBULATORY_CARE_PROVIDER_SITE_OTHER): Admitting: Neurology

## 2023-08-15 DIAGNOSIS — G43709 Chronic migraine without aura, not intractable, without status migrainosus: Secondary | ICD-10-CM

## 2023-08-15 MED ORDER — ONABOTULINUMTOXINA 100 UNITS IJ SOLR
200.0000 [IU] | Freq: Once | INTRAMUSCULAR | Status: AC
  Administered 2023-08-15: 155 [IU] via INTRAMUSCULAR

## 2023-08-15 NOTE — Progress Notes (Signed)
 [facility]   Procedure Note Botox  Attending: Dr. Shon Millet  Preoperative Diagnosis(es): Chronic migraine  Consent obtained from: The patient Benefits discussed included, but were not limited to decreased muscle tightness, increased joint range of motion, and decreased pain.  Risk discussed included, but were not limited pain and discomfort, bleeding, bruising, excessive weakness, venous thrombosis, muscle atrophy and dysphagia.  Anticipated outcomes of the procedure as well as he risks and benefits of the alternatives to the procedure, and the roles and tasks of the personnel to be involved, were discussed with the patient, and the patient consents to the procedure and agrees to proceed. A copy of the patient medication guide was given to the patient which explains the blackbox warning.  Patients identity and treatment sites confirmed Yes.  .  Details of Procedure: Skin was cleaned with alcohol. Prior to injection, the needle plunger was aspirated to make sure the needle was not within a blood vessel.  There was no blood retrieved on aspiration.    Following is a summary of the muscles injected  And the amount of Botulinum toxin used:  Dilution 200 units of Botox was reconstituted with 4 ml of preservative free normal saline. Time of reconstitution: At the time of the office visit (<30 minutes prior to injection)   Injections  155 total units of Botox was injected with a 30 gauge needle.  Injection Sites: L occipitalis: 15 units- 3 sites  R occiptalis: 15 units- 3 sites  L upper trapezius: 15 units- 3 sites R upper trapezius: 15 units- 3 sits          L paraspinal: 10 units- 2 sites R paraspinal: 10 units- 2 sites  Face L frontalis(2 injection sites):10 units   R frontalis(2 injection sites):10 units         L corrugator: 5 units   R corrugator: 5 units           Procerus: 5 units   L temporalis: 20 units R temporalis: 20 units   Agent:  200 units of botulinum Type  A (Onobotulinum Toxin type A) was reconstituted with 4 ml of preservative free normal saline.  Time of reconstitution: At the time of the office visit (<30 minutes prior to injection)     Total injected (Units):  155  Total wasted (Units):  45  Patient tolerated procedure well without complications.   Reinjection is anticipated in 3 months.

## 2023-09-02 ENCOUNTER — Telehealth: Payer: Self-pay | Admitting: Neurology

## 2023-09-02 NOTE — Telephone Encounter (Signed)
 Razer from [employer] called in this morning and stated that they need prior authorization for patient  to get the Ocrevus. The doctor needs to go to the porter and answer questions in 48 hours. The P code is [id number]> Thanks

## 2023-09-02 NOTE — Telephone Encounter (Signed)
 Rep will have the Prior auth team at [employer] call us  back. My extension given to call  back.

## 2023-09-05 NOTE — Telephone Encounter (Signed)
 Left a message with the after hour service on  09-05-23 at 12:09 pm   Caller states she is requesting med from the Dr prior shara for med is needed first  must be approved through the portal   Did not leave name of medication

## 2023-09-09 ENCOUNTER — Telehealth: Payer: Self-pay | Admitting: Neurology

## 2023-09-09 NOTE — Telephone Encounter (Signed)
 Form not completed online on the portal Cover My Meds Portal will ask for Key:[id number] Doctor needs to fill out and send to plan must be selected. Explained Dr. Skeet and Jesusa would not be in office till 09/10/23

## 2023-09-09 NOTE — Telephone Encounter (Signed)
 Submitted to plan on Covermymeds as requested.

## 2023-09-12 NOTE — Telephone Encounter (Signed)
 Pharmacy Patient Advocate Encounter  Received notification from [employer] [facility] [employer] that Prior Authorization for [id number] has been APPROVED from 08-10-2023 to 09-08-2024   PA #/Case ID/Reference #: [id number]

## 2023-09-22 ENCOUNTER — Telehealth: Payer: Self-pay | Admitting: Neurology

## 2023-09-22 NOTE — Telephone Encounter (Signed)
 Form is placed in Dr. Georjean folder.

## 2023-09-22 NOTE — Telephone Encounter (Signed)
 Pt. Filled out release and it was sent to Medical records and [facility] paperwork placed inbox

## 2023-09-25 ENCOUNTER — Telehealth: Payer: Self-pay | Admitting: Neurology

## 2023-09-25 DIAGNOSIS — Z0279 Encounter for issue of other medical certificate: Secondary | ICD-10-CM

## 2023-09-25 NOTE — Telephone Encounter (Signed)
 Pt.s DMV paperwork faxed and copy at front

## 2023-09-30 ENCOUNTER — Other Ambulatory Visit: Payer: Self-pay | Admitting: Neurology

## 2023-09-30 ENCOUNTER — Other Ambulatory Visit: Payer: Self-pay

## 2023-10-01 ENCOUNTER — Other Ambulatory Visit: Payer: Self-pay

## 2023-10-01 ENCOUNTER — Other Ambulatory Visit: Payer: Self-pay | Admitting: Neurology

## 2023-10-01 MED ORDER — BOTOX 200 UNITS IJ SOLR
INTRAMUSCULAR | 4 refills | Status: AC
  Filled 2023-10-01: qty 1, fill #0
  Filled 2023-11-04: qty 1, 90d supply, fill #0

## 2023-10-02 ENCOUNTER — Telehealth: Payer: Self-pay | Admitting: Internal Medicine

## 2023-10-02 ENCOUNTER — Other Ambulatory Visit: Payer: Self-pay

## 2023-10-02 ENCOUNTER — Other Ambulatory Visit (HOSPITAL_COMMUNITY): Payer: Self-pay

## 2023-10-02 NOTE — Telephone Encounter (Signed)
 Received request for medical records from [facility]... records sent 10/02/23

## 2023-10-16 ENCOUNTER — Other Ambulatory Visit: Payer: Self-pay | Admitting: Medical Genetics

## 2023-10-17 ENCOUNTER — Ambulatory Visit: Admitting: Neurology

## 2023-10-30 ENCOUNTER — Other Ambulatory Visit (HOSPITAL_BASED_OUTPATIENT_CLINIC_OR_DEPARTMENT_OTHER): Payer: Self-pay

## 2023-10-30 MED ORDER — DRONABINOL 2.5 MG PO CAPS
2.5000 mg | ORAL_CAPSULE | Freq: Two times a day (BID) | ORAL | 2 refills | Status: AC
  Filled 2023-10-30 – 2023-12-19 (×3): qty 60, 30d supply, fill #0

## 2023-10-31 ENCOUNTER — Other Ambulatory Visit (HOSPITAL_BASED_OUTPATIENT_CLINIC_OR_DEPARTMENT_OTHER): Payer: Self-pay

## 2023-11-03 ENCOUNTER — Other Ambulatory Visit: Payer: Self-pay

## 2023-11-03 ENCOUNTER — Other Ambulatory Visit (HOSPITAL_COMMUNITY): Payer: Self-pay

## 2023-11-03 ENCOUNTER — Other Ambulatory Visit (HOSPITAL_BASED_OUTPATIENT_CLINIC_OR_DEPARTMENT_OTHER): Payer: Self-pay

## 2023-11-04 ENCOUNTER — Other Ambulatory Visit: Payer: Self-pay

## 2023-11-04 NOTE — Progress Notes (Signed)
 Specialty Pharmacy Refill Coordination Note  Emily [name] is a 43 y.o. female assessed today regarding refills of clinic administered specialty medication(s) OnabotulinumtoxinA  (Botox )   Clinic requested Courier to Provider Office   Delivery date: 11/10/23   Verified address: [address] [place], [address]  [place] [address]   Medication will be filled on: 11/07/23    Appointment 11/14/23.

## 2023-11-06 ENCOUNTER — Other Ambulatory Visit: Payer: Self-pay

## 2023-11-14 ENCOUNTER — Ambulatory Visit: Admitting: Neurology

## 2023-11-21 ENCOUNTER — Ambulatory Visit: Admitting: Neurology

## 2023-11-26 ENCOUNTER — Encounter: Payer: Self-pay | Admitting: Neurology

## 2023-12-17 ENCOUNTER — Telehealth: Payer: Self-pay | Admitting: Neurology

## 2023-12-17 ENCOUNTER — Encounter: Payer: Self-pay | Admitting: Neurology

## 2023-12-17 NOTE — Telephone Encounter (Signed)
 Emily [name] with [facility] at [phone] stated that they fax over a Certificate of Medical , Certificate of  Medical Necessity  , and a letter of Medical Necessity from Doctor. Emily [name] stated that they request this information on 11-26-23 and the forms were faxed to our office last month.  I told Emily [name] to refax the documents to our office.  Thanks

## 2023-12-17 NOTE — Telephone Encounter (Signed)
 Paperwork faxed

## 2023-12-17 NOTE — Telephone Encounter (Signed)
 Done, thanks

## 2023-12-19 ENCOUNTER — Ambulatory Visit: Admitting: Neurology

## 2023-12-19 ENCOUNTER — Other Ambulatory Visit (HOSPITAL_BASED_OUTPATIENT_CLINIC_OR_DEPARTMENT_OTHER): Payer: Self-pay

## 2023-12-24 ENCOUNTER — Encounter: Payer: Self-pay | Admitting: Neurology

## 2024-01-07 ENCOUNTER — Telehealth: Payer: Self-pay | Admitting: Neurology

## 2024-01-07 NOTE — Telephone Encounter (Signed)
 Emily [name] with  [facility] called this afternoon.  Emily [name] stated that she receive the Certificate of CMN, but it was incorrect. Emily [name] need a Billable Secondary Code, Date of Onset, Need to know which code is Primary. Insurance will not accept G35 code.Needs the Physician  order in the CMN. Thanks

## 2024-01-16 ENCOUNTER — Ambulatory Visit (INDEPENDENT_AMBULATORY_CARE_PROVIDER_SITE_OTHER): Payer: Self-pay | Admitting: Neurology

## 2024-01-16 DIAGNOSIS — G43709 Chronic migraine without aura, not intractable, without status migrainosus: Secondary | ICD-10-CM | POA: Diagnosis not present

## 2024-01-16 MED ORDER — ONABOTULINUMTOXINA 100 UNITS IJ SOLR
200.0000 [IU] | Freq: Once | INTRAMUSCULAR | Status: AC
  Administered 2024-01-16: 155 [IU] via INTRAMUSCULAR

## 2024-01-16 NOTE — Progress Notes (Signed)
 [facility]   Procedure Note Botox   Attending: Dr. Juliene Dunnings  Preoperative Diagnosis(es): Chronic migraine  Consent obtained from: The patient Benefits discussed included, but were not limited to decreased muscle tightness, increased joint range of motion, and decreased pain.  Risk discussed included, but were not limited pain and discomfort, bleeding, bruising, excessive weakness, venous thrombosis, muscle atrophy and dysphagia.  Anticipated outcomes of the procedure as well as he risks and benefits of the alternatives to the procedure, and the roles and tasks of the personnel to be involved, were discussed with the patient, and the patient consents to the procedure and agrees to proceed. A copy of the patient medication guide was given to the patient which explains the blackbox warning.  Patients identity and treatment sites confirmed Yes.  .  Details of Procedure: Skin was cleaned with alcohol. Prior to injection, the needle plunger was aspirated to make sure the needle was not within a blood vessel.  There was no blood retrieved on aspiration.    Following is a summary of the muscles injected  And the amount of Botulinum toxin used:  Dilution 200 units of Botox  was reconstituted with 4 ml of preservative free normal saline. Time of reconstitution: At the time of the office visit (<30 minutes prior to injection)   Injections  155 total units of Botox  was injected with a 30 gauge needle.  Injection Sites: L occipitalis: 15 units- 3 sites  R occiptalis: 15 units- 3 sites  L upper trapezius: 15 units- 3 sites R upper trapezius: 15 units- 3 sits          L paraspinal: 10 units- 2 sites R paraspinal: 10 units- 2 sites  Face L frontalis(2 injection sites):10 units   R frontalis(2 injection sites):10 units         L corrugator: 5 units   R corrugator: 5 units           Procerus: 5 units   L temporalis: 20 units R temporalis: 20 units   Agent:  200 units of botulinum Type  A (Onobotulinum Toxin type A) was reconstituted with 4 ml of preservative free normal saline.  Time of reconstitution: At the time of the office visit (<30 minutes prior to injection)     Total injected (Units):  155  Total wasted (Units):  45  Patient tolerated procedure well without complications.   Reinjection is anticipated in 3 months.

## 2024-01-19 ENCOUNTER — Telehealth: Payer: Self-pay | Admitting: Neurology

## 2024-01-19 NOTE — Telephone Encounter (Signed)
 [facility] called Info was sent to wrong Provider on 01/07/24 Emily [name] with [facility] called this afternoon. Emily [name] stated that she receive the Certificate of CMN, but it was incorrect. Emily [name] need a Billable Secondary Code, Date of Onset, Need to know which code is Primary. Insurance will not accept G35 code.Needs the Physician order in the CMN. Thanks  Please call if any questions

## 2024-01-19 NOTE — Telephone Encounter (Signed)
 She has Relapsing Remitting MS: G35.A. Thanks!

## 2024-01-19 NOTE — Telephone Encounter (Signed)
 Paperwork refaxed

## 2024-01-20 NOTE — Telephone Encounter (Signed)
 See other phone note.

## 2024-01-28 ENCOUNTER — Other Ambulatory Visit: Payer: Self-pay

## 2024-02-06 ENCOUNTER — Other Ambulatory Visit (HOSPITAL_COMMUNITY): Payer: Self-pay | Admitting: Gastroenterology

## 2024-02-06 DIAGNOSIS — R110 Nausea: Secondary | ICD-10-CM

## 2024-03-19 ENCOUNTER — Ambulatory Visit: Admitting: Neurology

## 2024-04-16 ENCOUNTER — Ambulatory Visit: Admitting: Neurology

## 2024-06-24 ENCOUNTER — Ambulatory Visit: Admitting: Neurology

## 2024-06-30 ENCOUNTER — Ambulatory Visit: Admitting: Neurology
